# Patient Record
Sex: Female | Born: 1935 | Race: White | Hispanic: No | Marital: Married | Smoking: Never smoker
Health system: Southern US, Community
[De-identification: ages and names within clinical notes are randomized; demographics above are authoritative.]

## PROBLEM LIST (undated history)

## (undated) DIAGNOSIS — I2510 Atherosclerotic heart disease of native coronary artery without angina pectoris: Secondary | ICD-10-CM

## (undated) DIAGNOSIS — N182 Chronic kidney disease, stage 2 (mild): Secondary | ICD-10-CM

## (undated) DIAGNOSIS — D62 Acute posthemorrhagic anemia: Secondary | ICD-10-CM

## (undated) DIAGNOSIS — K56609 Unspecified intestinal obstruction, unspecified as to partial versus complete obstruction: Secondary | ICD-10-CM

## (undated) DIAGNOSIS — L900 Lichen sclerosus et atrophicus: Secondary | ICD-10-CM

## (undated) DIAGNOSIS — I471 Supraventricular tachycardia, unspecified: Secondary | ICD-10-CM

## (undated) DIAGNOSIS — K5730 Diverticulosis of large intestine without perforation or abscess without bleeding: Secondary | ICD-10-CM

## (undated) DIAGNOSIS — I4891 Unspecified atrial fibrillation: Secondary | ICD-10-CM

## (undated) DIAGNOSIS — I10 Essential (primary) hypertension: Secondary | ICD-10-CM

## (undated) DIAGNOSIS — M7070 Other bursitis of hip, unspecified hip: Secondary | ICD-10-CM

## (undated) DIAGNOSIS — L03115 Cellulitis of right lower limb: Secondary | ICD-10-CM

## (undated) DIAGNOSIS — K635 Polyp of colon: Secondary | ICD-10-CM

## (undated) DIAGNOSIS — M1990 Unspecified osteoarthritis, unspecified site: Secondary | ICD-10-CM

## (undated) DIAGNOSIS — M419 Scoliosis, unspecified: Secondary | ICD-10-CM

## (undated) DIAGNOSIS — Z860100 Personal history of colon polyps, unspecified: Secondary | ICD-10-CM

## (undated) DIAGNOSIS — M8580 Other specified disorders of bone density and structure, unspecified site: Secondary | ICD-10-CM

## (undated) DIAGNOSIS — R001 Bradycardia, unspecified: Secondary | ICD-10-CM

## (undated) DIAGNOSIS — C801 Malignant (primary) neoplasm, unspecified: Secondary | ICD-10-CM

## (undated) DIAGNOSIS — F419 Anxiety disorder, unspecified: Secondary | ICD-10-CM

## (undated) DIAGNOSIS — K589 Irritable bowel syndrome without diarrhea: Secondary | ICD-10-CM

## (undated) DIAGNOSIS — I491 Atrial premature depolarization: Secondary | ICD-10-CM

## (undated) DIAGNOSIS — K219 Gastro-esophageal reflux disease without esophagitis: Secondary | ICD-10-CM

## (undated) DIAGNOSIS — I4510 Unspecified right bundle-branch block: Secondary | ICD-10-CM

## (undated) DIAGNOSIS — N289 Disorder of kidney and ureter, unspecified: Secondary | ICD-10-CM

## (undated) HISTORY — DX: Unspecified intestinal obstruction, unspecified as to partial versus complete obstruction: K56.609

## (undated) HISTORY — DX: Supraventricular tachycardia: I47.1

## (undated) HISTORY — DX: Polyp of colon: K63.5

## (undated) HISTORY — DX: Supraventricular tachycardia, unspecified: I47.10

## (undated) HISTORY — DX: Personal history of colon polyps, unspecified: Z86.0100

## (undated) HISTORY — DX: Other bursitis of hip, unspecified hip: M70.70

## (undated) HISTORY — DX: Essential (primary) hypertension: I10

## (undated) HISTORY — DX: Anxiety disorder, unspecified: F41.9

## (undated) HISTORY — DX: Irritable bowel syndrome, unspecified: K58.9

## (undated) HISTORY — DX: Unspecified atrial fibrillation: I48.91

## (undated) HISTORY — PX: REDUCTION MAMMAPLASTY: SUR839

## (undated) HISTORY — DX: Atrial premature depolarization: I49.1

## (undated) HISTORY — DX: Other specified disorders of bone density and structure, unspecified site: M85.80

## (undated) HISTORY — PX: HAMMER TOE SURGERY: SHX385

## (undated) HISTORY — DX: Lichen sclerosus et atrophicus: L90.0

## (undated) HISTORY — DX: Diverticulosis of large intestine without perforation or abscess without bleeding: K57.30

## (undated) HISTORY — DX: Gastro-esophageal reflux disease without esophagitis: K21.9

## (undated) HISTORY — PX: HEMORRHOID BANDING: SHX5850

## (undated) HISTORY — DX: Personal history of colonic polyps: Z86.010

---
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
## 1998-11-08 ENCOUNTER — Other Ambulatory Visit: Admission: RE | Admit: 1998-11-08 | Discharge: 1998-11-08 | Payer: Self-pay | Admitting: Obstetrics and Gynecology

## 1999-12-07 ENCOUNTER — Other Ambulatory Visit: Admission: RE | Admit: 1999-12-07 | Discharge: 1999-12-07 | Payer: Self-pay | Admitting: Obstetrics and Gynecology

## 2000-04-19 ENCOUNTER — Ambulatory Visit (HOSPITAL_COMMUNITY): Admission: RE | Admit: 2000-04-19 | Discharge: 2000-04-19 | Payer: Self-pay | Admitting: Gastroenterology

## 2000-04-19 ENCOUNTER — Encounter: Payer: Self-pay | Admitting: Gastroenterology

## 2000-12-18 ENCOUNTER — Other Ambulatory Visit: Admission: RE | Admit: 2000-12-18 | Discharge: 2000-12-18 | Payer: Self-pay | Admitting: Obstetrics and Gynecology

## 2000-12-31 ENCOUNTER — Encounter: Admission: RE | Admit: 2000-12-31 | Discharge: 2000-12-31 | Payer: Self-pay | Admitting: Obstetrics and Gynecology

## 2000-12-31 ENCOUNTER — Encounter: Payer: Self-pay | Admitting: Obstetrics and Gynecology

## 2001-10-28 LAB — HM COLONOSCOPY: HM Colonoscopy: ABNORMAL

## 2001-12-18 ENCOUNTER — Other Ambulatory Visit: Admission: RE | Admit: 2001-12-18 | Discharge: 2001-12-18 | Payer: Self-pay | Admitting: Obstetrics and Gynecology

## 2002-01-05 ENCOUNTER — Encounter: Payer: Self-pay | Admitting: Obstetrics and Gynecology

## 2002-01-05 ENCOUNTER — Encounter: Admission: RE | Admit: 2002-01-05 | Discharge: 2002-01-05 | Payer: Self-pay | Admitting: Obstetrics and Gynecology

## 2002-01-08 ENCOUNTER — Encounter: Admission: RE | Admit: 2002-01-08 | Discharge: 2002-01-08 | Payer: Self-pay | Admitting: Obstetrics and Gynecology

## 2002-01-08 ENCOUNTER — Encounter: Payer: Self-pay | Admitting: Obstetrics and Gynecology

## 2002-01-08 HISTORY — PX: VAGINAL HYSTERECTOMY: SUR661

## 2002-01-08 HISTORY — PX: ANTERIOR AND POSTERIOR VAGINAL REPAIR: SUR5

## 2002-01-08 HISTORY — PX: PUBOVAGINAL SLING: SHX1035

## 2002-01-28 ENCOUNTER — Encounter: Payer: Self-pay | Admitting: Urology

## 2002-02-05 ENCOUNTER — Encounter: Payer: Self-pay | Admitting: Urology

## 2002-02-05 ENCOUNTER — Inpatient Hospital Stay (HOSPITAL_COMMUNITY): Admission: RE | Admit: 2002-02-05 | Discharge: 2002-02-07 | Payer: Self-pay | Admitting: Urology

## 2002-02-05 ENCOUNTER — Encounter (INDEPENDENT_AMBULATORY_CARE_PROVIDER_SITE_OTHER): Payer: Self-pay | Admitting: Specialist

## 2003-01-26 ENCOUNTER — Encounter: Admission: RE | Admit: 2003-01-26 | Discharge: 2003-01-26 | Payer: Self-pay | Admitting: Obstetrics and Gynecology

## 2003-01-26 ENCOUNTER — Encounter: Payer: Self-pay | Admitting: Obstetrics and Gynecology

## 2003-12-28 ENCOUNTER — Encounter: Admission: RE | Admit: 2003-12-28 | Discharge: 2003-12-28 | Payer: Self-pay | Admitting: Gastroenterology

## 2004-02-01 ENCOUNTER — Encounter: Admission: RE | Admit: 2004-02-01 | Discharge: 2004-02-01 | Payer: Self-pay | Admitting: Obstetrics and Gynecology

## 2004-07-28 ENCOUNTER — Ambulatory Visit: Payer: Self-pay | Admitting: Internal Medicine

## 2004-10-30 ENCOUNTER — Other Ambulatory Visit: Admission: RE | Admit: 2004-10-30 | Discharge: 2004-10-30 | Payer: Self-pay | Admitting: Obstetrics and Gynecology

## 2004-12-04 ENCOUNTER — Ambulatory Visit: Payer: Self-pay | Admitting: Internal Medicine

## 2004-12-19 ENCOUNTER — Ambulatory Visit: Payer: Self-pay | Admitting: Internal Medicine

## 2004-12-28 ENCOUNTER — Ambulatory Visit: Payer: Self-pay | Admitting: Cardiology

## 2004-12-28 ENCOUNTER — Ambulatory Visit: Payer: Self-pay | Admitting: *Deleted

## 2004-12-28 ENCOUNTER — Ambulatory Visit: Payer: Self-pay

## 2005-01-02 ENCOUNTER — Ambulatory Visit: Payer: Self-pay | Admitting: *Deleted

## 2005-02-06 ENCOUNTER — Encounter: Admission: RE | Admit: 2005-02-06 | Discharge: 2005-02-06 | Payer: Self-pay | Admitting: Obstetrics and Gynecology

## 2005-02-13 ENCOUNTER — Ambulatory Visit: Payer: Self-pay | Admitting: *Deleted

## 2005-04-17 ENCOUNTER — Ambulatory Visit: Payer: Self-pay | Admitting: Cardiology

## 2005-04-25 ENCOUNTER — Ambulatory Visit: Payer: Self-pay

## 2005-05-22 ENCOUNTER — Ambulatory Visit: Payer: Self-pay | Admitting: *Deleted

## 2005-10-31 ENCOUNTER — Other Ambulatory Visit: Admission: RE | Admit: 2005-10-31 | Discharge: 2005-10-31 | Payer: Self-pay | Admitting: Obstetrics and Gynecology

## 2005-12-05 ENCOUNTER — Ambulatory Visit: Payer: Self-pay | Admitting: *Deleted

## 2005-12-25 ENCOUNTER — Ambulatory Visit: Payer: Self-pay

## 2006-01-28 ENCOUNTER — Ambulatory Visit: Payer: Self-pay | Admitting: Internal Medicine

## 2006-03-25 ENCOUNTER — Encounter: Admission: RE | Admit: 2006-03-25 | Discharge: 2006-03-25 | Payer: Self-pay | Admitting: Obstetrics and Gynecology

## 2006-05-15 ENCOUNTER — Ambulatory Visit: Payer: Self-pay | Admitting: *Deleted

## 2006-06-05 ENCOUNTER — Ambulatory Visit: Payer: Self-pay | Admitting: Gastroenterology

## 2006-07-09 ENCOUNTER — Encounter: Admission: RE | Admit: 2006-07-09 | Discharge: 2006-07-09 | Payer: Self-pay | Admitting: Neurosurgery

## 2006-07-17 ENCOUNTER — Ambulatory Visit (HOSPITAL_COMMUNITY): Admission: RE | Admit: 2006-07-17 | Discharge: 2006-07-17 | Payer: Self-pay | Admitting: Neurosurgery

## 2006-07-21 ENCOUNTER — Encounter: Admission: RE | Admit: 2006-07-21 | Discharge: 2006-07-21 | Payer: Self-pay | Admitting: Neurosurgery

## 2006-07-26 ENCOUNTER — Ambulatory Visit: Payer: Self-pay | Admitting: Internal Medicine

## 2006-07-26 LAB — CONVERTED CEMR LAB
ALT: 18 units/L (ref 0–40)
AST: 26 units/L (ref 0–37)
Albumin: 3.2 g/dL — ABNORMAL LOW (ref 3.5–5.2)
Alkaline Phosphatase: 34 units/L — ABNORMAL LOW (ref 39–117)
BUN: 23 mg/dL (ref 6–23)
Basophils Absolute: 0 10*3/uL (ref 0.0–0.1)
Basophils Relative: 0.4 % (ref 0.0–1.0)
Bilirubin Urine: NEGATIVE
CO2: 26 meq/L (ref 19–32)
Calcium: 9.2 mg/dL (ref 8.4–10.5)
Chloride: 106 meq/L (ref 96–112)
Chol/HDL Ratio, serum: 2.8
Cholesterol: 197 mg/dL (ref 0–200)
Creatinine, Ser: 1.5 mg/dL — ABNORMAL HIGH (ref 0.4–1.2)
Eosinophil percent: 4.7 % (ref 0.0–5.0)
GFR calc non Af Amer: 37 mL/min
Glomerular Filtration Rate, Af Am: 44 mL/min/{1.73_m2}
Glucose, Bld: 88 mg/dL (ref 70–99)
HCT: 37 % (ref 36.0–46.0)
HDL: 70.2 mg/dL (ref >39.0)
Hemoglobin: 12.7 g/dL (ref 12.0–15.0)
Ketones, ur: NEGATIVE mg/dL
LDL Cholesterol: 90 mg/dL (ref 0–99)
Leukocytes, UA: NEGATIVE
Lymphocytes Relative: 30.2 % (ref 12.0–46.0)
MCHC: 34.3 g/dL (ref 30.0–36.0)
MCV: 92.1 fL (ref 78.0–100.0)
Monocytes Absolute: 0.7 10*3/uL (ref 0.2–0.7)
Monocytes Relative: 9.6 % (ref 3.0–11.0)
Neutro Abs: 3.8 10*3/uL (ref 1.4–7.7)
Neutrophils Relative %: 55.1 % (ref 43.0–77.0)
Nitrite: NEGATIVE
Platelets: 235 10*3/uL (ref 150–400)
Potassium: 5.3 meq/L — ABNORMAL HIGH (ref 3.5–5.1)
RBC: 4.02 M/uL (ref 3.87–5.11)
RDW: 13.1 % (ref 11.5–14.6)
Sodium: 139 meq/L (ref 135–145)
Specific Gravity, Urine: 1.02 (ref 1.000–1.03)
TSH: 5.92 microintl units/mL — ABNORMAL HIGH (ref 0.35–5.50)
Total Bilirubin: 0.6 mg/dL (ref 0.3–1.2)
Total Protein, Urine: NEGATIVE mg/dL
Total Protein: 6.3 g/dL (ref 6.0–8.3)
Triglyceride fasting, serum: 183 mg/dL — ABNORMAL HIGH (ref 0–149)
Urine Glucose: NEGATIVE mg/dL
Urobilinogen, UA: 0.2 (ref 0.0–1.0)
VLDL: 37 mg/dL (ref 0–40)
WBC: 6.9 10*3/uL (ref 4.5–10.5)
pH: 6 (ref 5.0–8.0)

## 2006-07-28 ENCOUNTER — Encounter: Admission: RE | Admit: 2006-07-28 | Discharge: 2006-07-28 | Payer: Self-pay | Admitting: Neurosurgery

## 2006-07-30 ENCOUNTER — Ambulatory Visit: Payer: Self-pay | Admitting: Internal Medicine

## 2006-08-12 ENCOUNTER — Encounter: Payer: Self-pay | Admitting: Neurosurgery

## 2006-08-21 ENCOUNTER — Ambulatory Visit (HOSPITAL_COMMUNITY): Admission: RE | Admit: 2006-08-21 | Discharge: 2006-08-21 | Payer: Self-pay | Admitting: Neurosurgery

## 2006-11-14 ENCOUNTER — Ambulatory Visit: Payer: Self-pay | Admitting: *Deleted

## 2006-11-27 ENCOUNTER — Other Ambulatory Visit: Admission: RE | Admit: 2006-11-27 | Discharge: 2006-11-27 | Payer: Self-pay | Admitting: Obstetrics and Gynecology

## 2007-03-18 ENCOUNTER — Ambulatory Visit: Payer: Self-pay | Admitting: Internal Medicine

## 2007-05-19 ENCOUNTER — Encounter: Admission: RE | Admit: 2007-05-19 | Discharge: 2007-05-19 | Payer: Self-pay | Admitting: Obstetrics and Gynecology

## 2007-06-16 ENCOUNTER — Encounter: Payer: Self-pay | Admitting: Internal Medicine

## 2007-06-16 DIAGNOSIS — Z860100 Personal history of colon polyps, unspecified: Secondary | ICD-10-CM | POA: Insufficient documentation

## 2007-06-16 DIAGNOSIS — K219 Gastro-esophageal reflux disease without esophagitis: Secondary | ICD-10-CM | POA: Insufficient documentation

## 2007-06-16 DIAGNOSIS — I10 Essential (primary) hypertension: Secondary | ICD-10-CM | POA: Insufficient documentation

## 2007-06-17 ENCOUNTER — Ambulatory Visit: Payer: Self-pay | Admitting: Internal Medicine

## 2007-08-11 ENCOUNTER — Encounter: Payer: Self-pay | Admitting: Internal Medicine

## 2007-08-20 ENCOUNTER — Encounter: Payer: Self-pay | Admitting: Internal Medicine

## 2007-10-15 ENCOUNTER — Encounter: Payer: Self-pay | Admitting: Internal Medicine

## 2007-12-04 ENCOUNTER — Other Ambulatory Visit: Admission: RE | Admit: 2007-12-04 | Discharge: 2007-12-04 | Payer: Self-pay | Admitting: Obstetrics and Gynecology

## 2007-12-08 ENCOUNTER — Telehealth: Payer: Self-pay | Admitting: Internal Medicine

## 2007-12-08 ENCOUNTER — Ambulatory Visit: Payer: Self-pay | Admitting: Internal Medicine

## 2007-12-08 DIAGNOSIS — R1032 Left lower quadrant pain: Secondary | ICD-10-CM | POA: Insufficient documentation

## 2007-12-08 LAB — CONVERTED CEMR LAB
Basophils Absolute: 0.1 10*3/uL (ref 0.0–0.1)
Basophils Relative: 0.7 % (ref 0.0–1.0)
Bilirubin Urine: NEGATIVE
Eosinophils Absolute: 0.3 10*3/uL (ref 0.0–0.7)
Eosinophils Relative: 3.2 % (ref 0.0–5.0)
HCT: 38 % (ref 36.0–46.0)
Hemoglobin, Urine: NEGATIVE
Hemoglobin: 12.6 g/dL (ref 12.0–15.0)
Ketones, ur: NEGATIVE mg/dL
Leukocytes, UA: NEGATIVE
Lymphocytes Relative: 17.7 % (ref 12.0–46.0)
MCHC: 33 g/dL (ref 30.0–36.0)
MCV: 92.7 fL (ref 78.0–100.0)
Monocytes Absolute: 0.7 10*3/uL (ref 0.1–1.0)
Monocytes Relative: 7.9 % (ref 3.0–12.0)
Neutro Abs: 6.6 10*3/uL (ref 1.4–7.7)
Neutrophils Relative %: 70.5 % (ref 43.0–77.0)
Nitrite: NEGATIVE
Platelets: 240 10*3/uL (ref 150–400)
RBC: 4.1 M/uL (ref 3.87–5.11)
RDW: 13.2 % (ref 11.5–14.6)
Sed Rate: 40 mm/hr — ABNORMAL HIGH (ref 0–22)
Specific Gravity, Urine: 1.025 (ref 1.000–1.03)
Total Protein, Urine: NEGATIVE mg/dL
Urine Glucose: NEGATIVE mg/dL
Urobilinogen, UA: 0.2 (ref 0.0–1.0)
WBC: 9.3 10*3/uL (ref 4.5–10.5)
pH: 5.5 (ref 5.0–8.0)

## 2008-01-20 ENCOUNTER — Encounter: Payer: Self-pay | Admitting: Internal Medicine

## 2008-05-20 ENCOUNTER — Telehealth: Payer: Self-pay | Admitting: Internal Medicine

## 2008-05-21 ENCOUNTER — Ambulatory Visit: Payer: Self-pay | Admitting: Internal Medicine

## 2008-05-21 DIAGNOSIS — F411 Generalized anxiety disorder: Secondary | ICD-10-CM | POA: Insufficient documentation

## 2008-05-21 DIAGNOSIS — F419 Anxiety disorder, unspecified: Secondary | ICD-10-CM | POA: Insufficient documentation

## 2008-05-21 DIAGNOSIS — K5730 Diverticulosis of large intestine without perforation or abscess without bleeding: Secondary | ICD-10-CM | POA: Insufficient documentation

## 2008-05-21 DIAGNOSIS — J309 Allergic rhinitis, unspecified: Secondary | ICD-10-CM | POA: Insufficient documentation

## 2008-05-21 DIAGNOSIS — K589 Irritable bowel syndrome without diarrhea: Secondary | ICD-10-CM | POA: Insufficient documentation

## 2008-05-24 ENCOUNTER — Ambulatory Visit: Payer: Self-pay | Admitting: Internal Medicine

## 2008-05-24 LAB — CONVERTED CEMR LAB
ALT: 24 units/L (ref 0–35)
AST: 29 units/L (ref 0–37)
Albumin: 3.4 g/dL — ABNORMAL LOW (ref 3.5–5.2)
Alkaline Phosphatase: 44 units/L (ref 39–117)
BUN: 23 mg/dL (ref 6–23)
Basophils Absolute: 0.1 10*3/uL (ref 0.0–0.1)
Basophils Relative: 1.2 % (ref 0.0–3.0)
Bilirubin Urine: NEGATIVE
Bilirubin, Direct: 0.1 mg/dL (ref 0.0–0.3)
CO2: 24 meq/L (ref 19–32)
Calcium: 8.7 mg/dL (ref 8.4–10.5)
Chloride: 112 meq/L (ref 96–112)
Cholesterol: 196 mg/dL (ref 0–200)
Creatinine, Ser: 1.3 mg/dL — ABNORMAL HIGH (ref 0.4–1.2)
Eosinophils Absolute: 0.3 10*3/uL (ref 0.0–0.7)
Eosinophils Relative: 4.9 % (ref 0.0–5.0)
GFR calc Af Amer: 52 mL/min
GFR calc non Af Amer: 43 mL/min
Glucose, Bld: 92 mg/dL (ref 70–99)
HCT: 35 % — ABNORMAL LOW (ref 36.0–46.0)
HDL: 71.9 mg/dL (ref >39.0)
Hemoglobin: 12.3 g/dL (ref 12.0–15.0)
Ketones, ur: NEGATIVE mg/dL
LDL Cholesterol: 99 mg/dL (ref 0–99)
Leukocytes, UA: NEGATIVE
Lymphocytes Relative: 31 % (ref 12.0–46.0)
MCHC: 35.3 g/dL (ref 30.0–36.0)
MCV: 91.6 fL (ref 78.0–100.0)
Monocytes Absolute: 0.5 10*3/uL (ref 0.1–1.0)
Monocytes Relative: 9.6 % (ref 3.0–12.0)
Neutro Abs: 2.9 10*3/uL (ref 1.4–7.7)
Neutrophils Relative %: 53.3 % (ref 43.0–77.0)
Nitrite: NEGATIVE
Platelets: 227 10*3/uL (ref 150–400)
Potassium: 4.2 meq/L (ref 3.5–5.1)
RBC: 3.82 M/uL — ABNORMAL LOW (ref 3.87–5.11)
RDW: 12.4 % (ref 11.5–14.6)
Sodium: 141 meq/L (ref 135–145)
Specific Gravity, Urine: 1.01 (ref 1.000–1.03)
TSH: 3.38 microintl units/mL (ref 0.35–5.50)
Total Bilirubin: 0.7 mg/dL (ref 0.3–1.2)
Total CHOL/HDL Ratio: 2.7
Total Protein, Urine: NEGATIVE mg/dL
Total Protein: 6.3 g/dL (ref 6.0–8.3)
Triglycerides: 128 mg/dL (ref 0–149)
Urine Glucose: NEGATIVE mg/dL
Urobilinogen, UA: 0.2 (ref 0.0–1.0)
VLDL: 26 mg/dL (ref 0–40)
WBC: 5.5 10*3/uL (ref 4.5–10.5)
pH: 6.5 (ref 5.0–8.0)

## 2008-05-26 ENCOUNTER — Ambulatory Visit: Payer: Self-pay | Admitting: Internal Medicine

## 2008-05-26 DIAGNOSIS — I959 Hypotension, unspecified: Secondary | ICD-10-CM | POA: Insufficient documentation

## 2008-06-03 ENCOUNTER — Encounter: Admission: RE | Admit: 2008-06-03 | Discharge: 2008-06-03 | Payer: Self-pay | Admitting: Obstetrics and Gynecology

## 2008-06-28 ENCOUNTER — Encounter: Payer: Self-pay | Admitting: Internal Medicine

## 2008-07-28 ENCOUNTER — Encounter: Payer: Self-pay | Admitting: Internal Medicine

## 2008-08-11 ENCOUNTER — Telehealth: Payer: Self-pay | Admitting: Internal Medicine

## 2008-08-16 ENCOUNTER — Encounter: Payer: Self-pay | Admitting: Internal Medicine

## 2008-08-18 ENCOUNTER — Encounter: Admission: RE | Admit: 2008-08-18 | Discharge: 2008-08-18 | Payer: Self-pay | Admitting: Gastroenterology

## 2008-08-24 ENCOUNTER — Ambulatory Visit: Payer: Self-pay | Admitting: Internal Medicine

## 2008-08-24 LAB — CONVERTED CEMR LAB
BUN: 25 mg/dL — ABNORMAL HIGH (ref 6–23)
CO2: 26 meq/L (ref 19–32)
Calcium: 8.8 mg/dL (ref 8.4–10.5)
Chloride: 107 meq/L (ref 96–112)
Creatinine, Ser: 1.3 mg/dL — ABNORMAL HIGH (ref 0.4–1.2)
GFR calc Af Amer: 52 mL/min
GFR calc non Af Amer: 43 mL/min
Glucose, Bld: 60 mg/dL — ABNORMAL LOW (ref 70–99)
Potassium: 4 meq/L (ref 3.5–5.1)
Sodium: 138 meq/L (ref 135–145)

## 2008-08-25 ENCOUNTER — Ambulatory Visit: Payer: Self-pay | Admitting: Internal Medicine

## 2008-08-25 DIAGNOSIS — N259 Disorder resulting from impaired renal tubular function, unspecified: Secondary | ICD-10-CM | POA: Insufficient documentation

## 2008-08-25 DIAGNOSIS — M25559 Pain in unspecified hip: Secondary | ICD-10-CM | POA: Insufficient documentation

## 2008-09-10 DIAGNOSIS — M7070 Other bursitis of hip, unspecified hip: Secondary | ICD-10-CM

## 2008-09-10 HISTORY — DX: Other bursitis of hip, unspecified hip: M70.70

## 2008-09-16 ENCOUNTER — Encounter: Admission: RE | Admit: 2008-09-16 | Discharge: 2008-09-16 | Payer: Self-pay | Admitting: Sports Medicine

## 2008-09-22 ENCOUNTER — Encounter: Admission: RE | Admit: 2008-09-22 | Discharge: 2008-09-22 | Payer: Self-pay | Admitting: Sports Medicine

## 2008-10-25 ENCOUNTER — Ambulatory Visit: Payer: Self-pay | Admitting: Internal Medicine

## 2008-10-25 LAB — CONVERTED CEMR LAB
BUN: 20 mg/dL (ref 6–23)
CO2: 26 meq/L (ref 19–32)
Calcium: 9.4 mg/dL (ref 8.4–10.5)
Chloride: 108 meq/L (ref 96–112)
Creatinine, Ser: 1.2 mg/dL (ref 0.4–1.2)
GFR calc Af Amer: 57 mL/min
GFR calc non Af Amer: 47 mL/min
Glucose, Bld: 82 mg/dL (ref 70–99)
Potassium: 4.3 meq/L (ref 3.5–5.1)
Sodium: 140 meq/L (ref 135–145)

## 2008-10-27 ENCOUNTER — Ambulatory Visit: Payer: Self-pay | Admitting: Internal Medicine

## 2008-10-27 DIAGNOSIS — R55 Syncope and collapse: Secondary | ICD-10-CM | POA: Insufficient documentation

## 2008-10-28 ENCOUNTER — Ambulatory Visit: Payer: Self-pay | Admitting: Internal Medicine

## 2008-10-28 ENCOUNTER — Encounter: Payer: Self-pay | Admitting: Internal Medicine

## 2008-10-28 LAB — CONVERTED CEMR LAB
BUN: 23 mg/dL (ref 6–23)
CO2: 27 meq/L (ref 19–32)
Calcium: 9 mg/dL (ref 8.4–10.5)
Chloride: 106 meq/L (ref 96–112)
Cortisol, Plasma: 6.3 ug/dL
Creatinine, Ser: 1.3 mg/dL — ABNORMAL HIGH (ref 0.4–1.2)
GFR calc Af Amer: 52 mL/min
GFR calc non Af Amer: 43 mL/min
Glucose, Bld: 85 mg/dL (ref 70–99)
Potassium: 4 meq/L (ref 3.5–5.1)
Sodium: 139 meq/L (ref 135–145)

## 2008-11-01 LAB — CONVERTED CEMR LAB: Cortisol, Plasma: 15 ug/dL

## 2008-11-03 ENCOUNTER — Telehealth (INDEPENDENT_AMBULATORY_CARE_PROVIDER_SITE_OTHER): Payer: Self-pay | Admitting: *Deleted

## 2008-11-04 ENCOUNTER — Encounter: Payer: Self-pay | Admitting: Internal Medicine

## 2008-11-05 ENCOUNTER — Telehealth: Payer: Self-pay | Admitting: Internal Medicine

## 2008-11-15 ENCOUNTER — Encounter: Payer: Self-pay | Admitting: Internal Medicine

## 2008-11-16 ENCOUNTER — Encounter: Admission: RE | Admit: 2008-11-16 | Discharge: 2008-11-16 | Payer: Self-pay | Admitting: Neurosurgery

## 2008-11-25 ENCOUNTER — Encounter: Payer: Self-pay | Admitting: Internal Medicine

## 2008-12-01 ENCOUNTER — Ambulatory Visit: Payer: Self-pay | Admitting: Internal Medicine

## 2008-12-06 ENCOUNTER — Other Ambulatory Visit: Admission: RE | Admit: 2008-12-06 | Discharge: 2008-12-06 | Payer: Self-pay | Admitting: Obstetrics and Gynecology

## 2008-12-06 ENCOUNTER — Encounter: Payer: Self-pay | Admitting: Obstetrics and Gynecology

## 2008-12-06 ENCOUNTER — Ambulatory Visit: Payer: Self-pay | Admitting: Obstetrics and Gynecology

## 2009-01-13 ENCOUNTER — Encounter: Payer: Self-pay | Admitting: Internal Medicine

## 2009-02-04 ENCOUNTER — Encounter: Payer: Self-pay | Admitting: Internal Medicine

## 2009-03-29 ENCOUNTER — Telehealth: Payer: Self-pay | Admitting: Internal Medicine

## 2009-03-30 ENCOUNTER — Encounter: Payer: Self-pay | Admitting: Internal Medicine

## 2009-03-31 ENCOUNTER — Encounter: Payer: Self-pay | Admitting: Internal Medicine

## 2009-04-10 HISTORY — PX: KNEE BURSECTOMY: SHX5882

## 2009-04-10 HISTORY — PX: HIP SURGERY: SHX245

## 2009-04-15 ENCOUNTER — Encounter: Admission: RE | Admit: 2009-04-15 | Discharge: 2009-04-15 | Payer: Self-pay | Admitting: Orthopedic Surgery

## 2009-04-18 ENCOUNTER — Ambulatory Visit (HOSPITAL_BASED_OUTPATIENT_CLINIC_OR_DEPARTMENT_OTHER): Admission: RE | Admit: 2009-04-18 | Discharge: 2009-04-18 | Payer: Self-pay | Admitting: Orthopedic Surgery

## 2009-06-03 ENCOUNTER — Encounter: Payer: Self-pay | Admitting: Internal Medicine

## 2009-06-08 ENCOUNTER — Encounter: Admission: RE | Admit: 2009-06-08 | Discharge: 2009-06-08 | Payer: Self-pay | Admitting: Obstetrics and Gynecology

## 2009-06-14 ENCOUNTER — Ambulatory Visit: Payer: Self-pay | Admitting: Internal Medicine

## 2009-06-16 LAB — CONVERTED CEMR LAB
BUN: 23 mg/dL (ref 6–23)
Basophils Absolute: 0 10*3/uL (ref 0.0–0.1)
Basophils Relative: 0.4 % (ref 0.0–3.0)
CO2: 26 meq/L (ref 19–32)
Calcium: 8.9 mg/dL (ref 8.4–10.5)
Chloride: 109 meq/L (ref 96–112)
Creatinine, Ser: 1.3 mg/dL — ABNORMAL HIGH (ref 0.4–1.2)
Eosinophils Absolute: 0.3 10*3/uL (ref 0.0–0.7)
Eosinophils Relative: 3.7 % (ref 0.0–5.0)
GFR calc non Af Amer: 42.73 mL/min (ref >60)
Glucose, Bld: 95 mg/dL (ref 70–99)
HCT: 36.9 % (ref 36.0–46.0)
Hemoglobin: 12.5 g/dL (ref 12.0–15.0)
Lymphocytes Relative: 26.8 % (ref 12.0–46.0)
Lymphs Abs: 2 10*3/uL (ref 0.7–4.0)
MCHC: 34 g/dL (ref 30.0–36.0)
MCV: 93.2 fL (ref 78.0–100.0)
Monocytes Absolute: 0.7 10*3/uL (ref 0.1–1.0)
Monocytes Relative: 9.6 % (ref 3.0–12.0)
Neutro Abs: 4.5 10*3/uL (ref 1.4–7.7)
Neutrophils Relative %: 59.5 % (ref 43.0–77.0)
Platelets: 231 10*3/uL (ref 150.0–400.0)
Potassium: 4.1 meq/L (ref 3.5–5.1)
RBC: 3.95 M/uL (ref 3.87–5.11)
RDW: 12.4 % (ref 11.5–14.6)
Sed Rate: 39 mm/hr — ABNORMAL HIGH (ref 0–22)
Sodium: 141 meq/L (ref 135–145)
Uric Acid, Serum: 6.1 mg/dL (ref 2.4–7.0)
WBC: 7.5 10*3/uL (ref 4.5–10.5)

## 2009-07-25 ENCOUNTER — Encounter: Payer: Self-pay | Admitting: Internal Medicine

## 2009-08-12 ENCOUNTER — Telehealth: Payer: Self-pay | Admitting: Internal Medicine

## 2009-08-13 ENCOUNTER — Encounter: Payer: Self-pay | Admitting: Internal Medicine

## 2009-08-13 ENCOUNTER — Encounter: Admission: RE | Admit: 2009-08-13 | Discharge: 2009-08-13 | Payer: Self-pay | Admitting: Orthopedic Surgery

## 2009-08-19 ENCOUNTER — Encounter: Payer: Self-pay | Admitting: Internal Medicine

## 2009-08-23 ENCOUNTER — Encounter: Payer: Self-pay | Admitting: Internal Medicine

## 2009-08-23 ENCOUNTER — Encounter: Admission: RE | Admit: 2009-08-23 | Discharge: 2009-08-23 | Payer: Self-pay | Admitting: Neurosurgery

## 2009-08-24 ENCOUNTER — Ambulatory Visit: Payer: Self-pay | Admitting: Internal Medicine

## 2009-09-09 ENCOUNTER — Encounter: Payer: Self-pay | Admitting: Internal Medicine

## 2009-09-27 ENCOUNTER — Encounter: Payer: Self-pay | Admitting: Internal Medicine

## 2009-10-19 ENCOUNTER — Telehealth: Payer: Self-pay | Admitting: Internal Medicine

## 2009-10-31 ENCOUNTER — Ambulatory Visit: Payer: Self-pay | Admitting: Internal Medicine

## 2009-10-31 DIAGNOSIS — R51 Headache: Secondary | ICD-10-CM

## 2009-10-31 DIAGNOSIS — R519 Headache, unspecified: Secondary | ICD-10-CM | POA: Insufficient documentation

## 2009-11-07 ENCOUNTER — Telehealth: Payer: Self-pay | Admitting: Internal Medicine

## 2009-11-11 ENCOUNTER — Encounter: Payer: Self-pay | Admitting: Internal Medicine

## 2009-11-11 ENCOUNTER — Telehealth: Payer: Self-pay | Admitting: Internal Medicine

## 2009-11-14 ENCOUNTER — Telehealth: Payer: Self-pay | Admitting: Internal Medicine

## 2009-11-25 ENCOUNTER — Telehealth: Payer: Self-pay | Admitting: Internal Medicine

## 2009-12-01 ENCOUNTER — Ambulatory Visit: Payer: Self-pay | Admitting: Internal Medicine

## 2009-12-01 DIAGNOSIS — Z87891 Personal history of nicotine dependence: Secondary | ICD-10-CM | POA: Insufficient documentation

## 2009-12-02 ENCOUNTER — Telehealth: Payer: Self-pay | Admitting: Internal Medicine

## 2009-12-07 ENCOUNTER — Other Ambulatory Visit: Admission: RE | Admit: 2009-12-07 | Discharge: 2009-12-07 | Payer: Self-pay | Admitting: Obstetrics and Gynecology

## 2009-12-07 ENCOUNTER — Ambulatory Visit: Payer: Self-pay | Admitting: Obstetrics and Gynecology

## 2010-02-15 ENCOUNTER — Ambulatory Visit: Payer: Self-pay | Admitting: Internal Medicine

## 2010-02-15 DIAGNOSIS — R209 Unspecified disturbances of skin sensation: Secondary | ICD-10-CM | POA: Insufficient documentation

## 2010-02-15 DIAGNOSIS — R109 Unspecified abdominal pain: Secondary | ICD-10-CM | POA: Insufficient documentation

## 2010-02-17 LAB — CONVERTED CEMR LAB
ALT: 17 units/L (ref 0–35)
AST: 29 units/L (ref 0–37)
Albumin: 3.6 g/dL (ref 3.5–5.2)
Alkaline Phosphatase: 50 units/L (ref 39–117)
BUN: 21 mg/dL (ref 6–23)
Basophils Absolute: 0 10*3/uL (ref 0.0–0.1)
Basophils Relative: 0.6 % (ref 0.0–3.0)
Bilirubin, Direct: 0.1 mg/dL (ref 0.0–0.3)
CO2: 26 meq/L (ref 19–32)
Calcium: 9.1 mg/dL (ref 8.4–10.5)
Chloride: 103 meq/L (ref 96–112)
Creatinine, Ser: 1 mg/dL (ref 0.4–1.2)
Eosinophils Absolute: 0.4 10*3/uL (ref 0.0–0.7)
Eosinophils Relative: 5.7 % — ABNORMAL HIGH (ref 0.0–5.0)
GFR calc non Af Amer: 56.42 mL/min (ref >60)
Glucose, Bld: 75 mg/dL (ref 70–99)
HCT: 37.5 % (ref 36.0–46.0)
Hemoglobin: 13 g/dL (ref 12.0–15.0)
Lymphocytes Relative: 21.6 % (ref 12.0–46.0)
Lymphs Abs: 1.6 10*3/uL (ref 0.7–4.0)
MCHC: 34.7 g/dL (ref 30.0–36.0)
MCV: 91 fL (ref 78.0–100.0)
Monocytes Absolute: 0.7 10*3/uL (ref 0.1–1.0)
Monocytes Relative: 9.6 % (ref 3.0–12.0)
Neutro Abs: 4.5 10*3/uL (ref 1.4–7.7)
Neutrophils Relative %: 62.5 % (ref 43.0–77.0)
Platelets: 241 10*3/uL (ref 150.0–400.0)
Potassium: 4.8 meq/L (ref 3.5–5.1)
RBC: 4.12 M/uL (ref 3.87–5.11)
RDW: 13.7 % (ref 11.5–14.6)
Sodium: 137 meq/L (ref 135–145)
TSH: 1.7 microintl units/mL (ref 0.35–5.50)
Total Bilirubin: 0.9 mg/dL (ref 0.3–1.2)
Total Protein: 6.2 g/dL (ref 6.0–8.3)
Vitamin B-12: 593 pg/mL (ref 211–911)
WBC: 7.3 10*3/uL (ref 4.5–10.5)

## 2010-03-30 ENCOUNTER — Telehealth: Payer: Self-pay | Admitting: Internal Medicine

## 2010-03-30 ENCOUNTER — Ambulatory Visit: Payer: Self-pay | Admitting: Sports Medicine

## 2010-04-11 ENCOUNTER — Telehealth: Payer: Self-pay | Admitting: Sports Medicine

## 2010-05-18 ENCOUNTER — Ambulatory Visit: Payer: Self-pay | Admitting: Sports Medicine

## 2010-05-18 DIAGNOSIS — M5137 Other intervertebral disc degeneration, lumbosacral region: Secondary | ICD-10-CM | POA: Insufficient documentation

## 2010-06-07 ENCOUNTER — Encounter: Payer: Self-pay | Admitting: Internal Medicine

## 2010-06-12 ENCOUNTER — Encounter: Admission: RE | Admit: 2010-06-12 | Discharge: 2010-06-12 | Payer: Self-pay | Admitting: Obstetrics and Gynecology

## 2010-06-22 ENCOUNTER — Telehealth: Payer: Self-pay | Admitting: Internal Medicine

## 2010-07-03 ENCOUNTER — Ambulatory Visit: Payer: Self-pay | Admitting: Sports Medicine

## 2010-07-03 DIAGNOSIS — M1612 Unilateral primary osteoarthritis, left hip: Secondary | ICD-10-CM | POA: Insufficient documentation

## 2010-07-17 ENCOUNTER — Encounter: Payer: Self-pay | Admitting: Internal Medicine

## 2010-07-20 ENCOUNTER — Ambulatory Visit: Payer: Self-pay | Admitting: Sports Medicine

## 2010-07-28 ENCOUNTER — Encounter
Admission: RE | Admit: 2010-07-28 | Discharge: 2010-08-24 | Payer: Self-pay | Source: Home / Self Care | Attending: Sports Medicine | Admitting: Sports Medicine

## 2010-08-18 ENCOUNTER — Telehealth (INDEPENDENT_AMBULATORY_CARE_PROVIDER_SITE_OTHER): Payer: Self-pay | Admitting: *Deleted

## 2010-08-25 ENCOUNTER — Telehealth: Payer: Self-pay | Admitting: Internal Medicine

## 2010-08-31 ENCOUNTER — Ambulatory Visit: Payer: Self-pay | Admitting: Sports Medicine

## 2010-09-06 ENCOUNTER — Ambulatory Visit
Admission: RE | Admit: 2010-09-06 | Discharge: 2010-09-06 | Payer: Self-pay | Source: Home / Self Care | Attending: Internal Medicine | Admitting: Internal Medicine

## 2010-09-06 DIAGNOSIS — M545 Low back pain, unspecified: Secondary | ICD-10-CM | POA: Insufficient documentation

## 2010-09-12 ENCOUNTER — Telehealth: Payer: Self-pay | Admitting: Internal Medicine

## 2010-09-13 ENCOUNTER — Ambulatory Visit: Admit: 2010-09-13 | Payer: Self-pay

## 2010-09-15 ENCOUNTER — Telehealth: Payer: Self-pay | Admitting: Internal Medicine

## 2010-09-19 ENCOUNTER — Telehealth (INDEPENDENT_AMBULATORY_CARE_PROVIDER_SITE_OTHER): Payer: Self-pay | Admitting: *Deleted

## 2010-09-20 ENCOUNTER — Ambulatory Visit
Admission: RE | Admit: 2010-09-20 | Discharge: 2010-09-20 | Payer: Self-pay | Source: Home / Self Care | Attending: Internal Medicine | Admitting: Internal Medicine

## 2010-09-20 ENCOUNTER — Ambulatory Visit: Admit: 2010-09-20 | Payer: Self-pay | Admitting: Internal Medicine

## 2010-09-20 ENCOUNTER — Telehealth: Payer: Self-pay | Admitting: Internal Medicine

## 2010-09-20 DIAGNOSIS — R059 Cough, unspecified: Secondary | ICD-10-CM | POA: Insufficient documentation

## 2010-09-20 DIAGNOSIS — J069 Acute upper respiratory infection, unspecified: Secondary | ICD-10-CM | POA: Insufficient documentation

## 2010-09-20 DIAGNOSIS — R05 Cough: Secondary | ICD-10-CM | POA: Insufficient documentation

## 2010-09-21 ENCOUNTER — Ambulatory Visit
Admission: RE | Admit: 2010-09-21 | Discharge: 2010-09-21 | Payer: Self-pay | Source: Home / Self Care | Attending: Sports Medicine | Admitting: Sports Medicine

## 2010-09-25 ENCOUNTER — Telehealth: Payer: Self-pay | Admitting: Internal Medicine

## 2010-10-01 ENCOUNTER — Encounter: Payer: Self-pay | Admitting: Neurosurgery

## 2010-10-02 ENCOUNTER — Encounter: Payer: Self-pay | Admitting: Obstetrics and Gynecology

## 2010-10-10 NOTE — Assessment & Plan Note (Signed)
Summary: GROIN PAIN/ PER AVP /NWS   Vital Signs:  Patient Profile:   76 Years Old Female Temp:     96.5 degrees F 59oral Pulse rate:   59 / minute BP sitting:   134 / 88  (left arm)  Vitals Entered By: Tora Perches (December 08, 2007 2:23 PM)                 Chief Complaint:  groin pain.  History of Present Illness: C/0 L groin pain since Fri, worse with sitting and getting up. Was working out prior: did lunges. Took a 1/2 Oxycod    Current Allergies (reviewed today): No known allergies   Past Medical History:    Reviewed history from 06/16/2007 and no changes required:       Colonic polyps, hx of       GERD       Hypertension                     Family hx   colon ca   Family History:    Family History Hypertension  Social History:    Married    Former Smoker    Regular exercise-yes   Risk Factors:  Tobacco use:  quit Exercise:  yes   Review of Systems  The patient denies fever, chest pain, syncope, dyspnea on exhertion, prolonged cough, abdominal pain, melena, hematochezia, and hematuria.     Physical Exam  General:     Well-developed,well-nourished,in no acute distress; alert,appropriate and cooperative throughout examination Head:     Normocephalic and atraumatic without obvious abnormalities. No apparent alopecia or balding. Mouth:     Oral mucosa and oropharynx without lesions or exudates.  Teeth in good repair. Neck:     No deformities, masses, or tenderness noted. Lungs:     Normal respiratory effort, chest expands symmetrically. Lungs are clear to auscultation, no crackles or wheezes. Heart:     Normal rate and regular rhythm. S1 and S2 normal without gallop, murmur, click, rub or other extra sounds. Abdomen:     Bowel sounds positive,abdomen soft and non-tender without masses, organomegaly or hernias noted.no inguinal hernia.   Sensitive in LLQ/groin with palp. and rom Msk:     L medial pros. thigh is tender Extremities:     No  clubbing, cyanosis, edema, or deformity noted with normal full range of motion of all joints.   Neurologic:     No cranial nerve deficits noted. Station and gait are normal. Plantar reflexes are down-going bilaterally. DTRs are symmetrical throughout. Sensory, motor and coordinative functions appear intact. Skin:     Intact without suspicious lesions or rashes Psych:     Cognition and judgment appear intact. Alert and cooperative with normal attention span and concentration. No apparent delusions, illusions, hallucinations    Impression & Recommendations:  Problem # 1:  ABDOMINAL PAIN (ICD-789.00) Assessment: New Poss. MSK. Rx given/rest. If fever -take Cipro. Labs/xray Orders: T-Pelvis 1or 2 views (72170TC) TLB-CBC Platelet - w/Differential (85025-CBCD) TLB-Udip ONLY (81003-UDIP) TLB-Sedimentation Rate (ESR) (85651-ESR)   Complete Medication List: 1)  Super Probiotic Caps (Misc intestinal flora regulat) .... Once daily 2)  Norvasc 5 Mg Tabs (Amlodipine besylate) .... Once daily 3)  Nexium 40 Mg Cpdr (Esomeprazole magnesium) .... Once daily 4)  Estradiol 1 Mg Tabs (Estradiol) .... Once daily 5)  Toprol Xl 25 Mg Tb24 (Metoprolol succinate) .... Once daily 6)  Multivitamins Tabs (Multiple vitamin) .... Once daily 7)  Magnesia 311 Mg Chew (  Magnesium hydroxide) .... Once daily 8)  Ativan 1 Mg Tabs (Lorazepam) .... At bedtime 9)  Zyrtec Allergy 10 Mg Tabs (Cetirizine hcl) .... Otc as needed 10)  Meloxicam 15 Mg Tabs (Meloxicam) .... One by mouth daily x 15 d then prn 11)  Percocet 5-325 Mg Tabs (Oxycodone-acetaminophen) .Marland Kitchen.. 1 by mouth qid as needed pain 12)  Cipro 500 Mg Tabs (Ciprofloxacin hcl) .Marland Kitchen.. 1 by mouth 2 times daily   Patient Instructions: 1)  Please schedule a follow-up appointment in 2 weeks.    Prescriptions: CIPRO 500 MG TABS (CIPROFLOXACIN HCL) 1 by mouth 2 times daily  #20 x 0   Entered and Authorized by:   Tresa Garter MD   Signed by:   Tresa Garter MD on 12/08/2007   Method used:   Print then Give to Patient   RxID:   [id number] PERCOCET 5-325 MG  TABS (OXYCODONE-ACETAMINOPHEN) 1 by mouth qid as needed pain  #60 x 0   Entered and Authorized by:   Tresa Garter MD   Signed by:   Tresa Garter MD on 12/08/2007   Method used:   Print then Give to Patient   RxID:   [id number] MELOXICAM 15 MG TABS (MELOXICAM) one by mouth daily x 15 d then prn  #30 x 2   Entered and Authorized by:   Tresa Garter MD   Signed by:   Tresa Garter MD on 12/08/2007   Method used:   Print then Give to Patient   RxID:   [id number]  ]

## 2010-10-10 NOTE — Letter (Signed)
Summary: [facility]   Imported By: Esmeralda Links D'jimraou 07/19/2008 09:01:58  _____________________________________________________________________  External Attachment:    Type:   Image     Comment:   External Document

## 2010-10-10 NOTE — Progress Notes (Signed)
Summary: [facility] Referral  Phone Note From Other Clinic   Caller: Referral Coordinator Reason for Call: Schedule Patient Appt Action Taken: Provider Notified Summary of Call: Dr Clayborne Artist from [facility] called said they called patient to schedule appointment,patient said that you said she did not need to have this appointment.Does she need appointment ? Initial call taken by: Dagoberto Reef,  November 03, 2008 3:50 PM  Follow-up for Phone Call        Let us go thru with the appt: the last kidney test on 2/18 came back borderline again (to be on the safe side)  Follow-up by: Tresa Garter MD,  November 03, 2008 4:03 PM

## 2010-10-10 NOTE — Progress Notes (Signed)
  Phone Note Call from Patient   Summary of Call: Diarrhea x3-4 d, husband had it too Initial call taken by: Tresa Garter MD,  August 12, 2009 12:07 PM  Follow-up for Phone Call        Pls call in Lomotil Cipro if not better She will get Peptobismol Follow-up by: Tresa Garter MD,  August 12, 2009 12:07 PM  Additional Follow-up for Phone Call Additional follow up Details #1::        Rx's called in pt aware Additional Follow-up by: Lamar Sprinkles, CMA,  August 12, 2009 12:53 PM    New/Updated Medications: LOMOTIL 2.5-0.025 MG TABS (DIPHENOXYLATE-ATROPINE) 1-2 by mouth qid as needed diarrhea CIPROFLOXACIN HCL 500 MG TABS (CIPROFLOXACIN HCL) 1 by mouth bid Prescriptions: CIPROFLOXACIN HCL 500 MG TABS (CIPROFLOXACIN HCL) 1 by mouth bid  #14 x 1   Entered by:   Lamar Sprinkles, CMA   Authorized by:   Tresa Garter MD   Signed by:   Lamar Sprinkles, CMA on 08/12/2009   Method used:   Telephoned to ...       [facility]* (retail)       [address]       [place], [place]  [address]       Ph: [phone]       Fax: [phone]   RxID:   [id number] LOMOTIL 2.5-0.025 MG TABS (DIPHENOXYLATE-ATROPINE) 1-2 by mouth qid as needed diarrhea  #60 x 1   Entered by:   Lamar Sprinkles, CMA   Authorized by:   Tresa Garter MD   Signed by:   Lamar Sprinkles, CMA on 08/12/2009   Method used:   Telephoned to ...       [facility]* (retail)       [address]       [place], [place]  [address]       Ph: [phone]       Fax: [phone]   RxID:   [id number]

## 2010-10-10 NOTE — Progress Notes (Signed)
Summary: Low BP  Phone Note Call from Patient   Summary of Call: Pt c/o feeling tired & low bp, 92/72. Pt had left vm on Dr Plotnikov's ext but did not get a call back so call triage at 4:35. She has felt  tired & lightheaded some today. Says this happens about 1 x qmth. Advised her to come in to be seen tomorrow. She said she would wait & call first thing if does not feel better tomorrow am. If ok she will come to regularly scheduled apt next week. Pt aware to go to ER if symptoms increased.  Initial call taken by: Lamar Sprinkles,  May 20, 2008 5:23 PM  Follow-up for Phone Call        seen at [facility] today Follow-up by: Corwin Levins MD,  May 21, 2008 6:26 PM

## 2010-10-10 NOTE — Assessment & Plan Note (Signed)
Summary: f/u per pt/#/cd   Vital Signs:  Patient profile:   76 year old female Height:      64 inches Weight:      166.25 pounds BMI:     28.64 O2 Sat:      95 % on Room air Temp:     97.6 degrees F oral Pulse rate:   59 / minute BP sitting:   144 / 100  (left arm) Cuff size:   regular  Vitals Entered By: Lucious Groves (February 15, 2010 11:26 AM)  O2 Flow:  Room air CC: Follow-up visit./kb, Hypertension Management Is Patient Diabetic? No Pain Assessment Patient in pain? no      Comments Patient notes she has had HA and one episode of dizziness./kb   CC:  Follow-up visit./kb and Hypertension Management.  History of Present Illness: The patient presents for a follow up of L groin pain (not better), anxiety, depression and headaches. Her groin felt better walking on the sand   Hypertension History:      Positive major cardiovascular risk factors include female age 46 years old or older and hypertension.  Negative major cardiovascular risk factors include non-tobacco-user status.     Current Medications (verified): 1)  Super Probiotic   Caps (Misc Intestinal Flora Regulat) .... Once Daily 2)  Nexium 40 Mg  Cpdr (Esomeprazole Magnesium) .... Once Daily 3)  Estradiol 1 Mg  Tabs (Estradiol) .... Once Daily 4)  Toprol Xl 25 Mg  Tb24 (Metoprolol Succinate) .... 1/2 By Mouth Bid 5)  Multivitamins   Tabs (Multiple Vitamin) .... Once Daily 6)  Ativan 1 Mg  Tabs (Lorazepam) .Marland Kitchen.. 1 At Bedtime Prn 7)  Zyrtec Allergy 10 Mg  Tabs (Cetirizine Hcl) .... Otc As Needed 8)  Pennsaid 1.5 % Soln (Diclofenac Sodium) .... 3-5 Gtt To Skin Three Times Daily 9)  Lomotil 2.5-0.025 Mg Tabs (Diphenoxylate-Atropine) .Marland Kitchen.. 1-2 By Mouth Qid As Needed Diarrhea 10)  Skelaxin 800 Mg Tabs (Metaxalone) .Marland Kitchen.. 1 By Mouth Two or Four Times A Day As Needed Back Spasm 11)  Sumatriptan Succinate 100 Mg Tabs (Sumatriptan Succinate) .Marland Kitchen.. 1 By Mouth Once Daily As Needed For Migraine 12)  Cymbalta 60 Mg Cpep (Duloxetine  Hcl) .Marland Kitchen.. 1 By Mouth Once Daily For Depression  Allergies (verified): No Known Drug Allergies  Past History:  Past Medical History: Last updated: 08/24/2009 Colonic polyps, hx of GERD Hypertension hx of brief SVT symptomatic PAC's Anxiety Allergic rhinitis IBS - constipation predominant Dr Kinnie Scales Diverticulosis, colon Gyn Dr Eda Paschal L hip bursitis Dr Wyline Mood, post-op seroma 2010 Partial old gluteus medius and minor tear on MRI 2010  Past Surgical History: Reviewed history from 06/14/2009 and no changes required. Hysterectomy Bladder surgury s/p breast reduction surgury Appendectomy L troch bursa resection 2010  Family History: Reviewed history from 05/21/2008 and no changes required. Family History Hypertension Colon cancer - mother  Social History: Reviewed history from 05/21/2008 and no changes required. 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  The patient denies fever, chest pain, syncope, abdominal pain, and muscle weakness.    Physical Exam  General:  Well Head:  NT No rash Nose:  External nasal examination shows no deformity or inflammation. Nasal mucosa are pink and moist without lesions or exudates. Mouth:  Oral mucosa and oropharynx without lesions or exudates.  Teeth in good repair. Lungs:  Normal respiratory effort, chest expands  symmetrically. Lungs are clear to auscultation, no crackles or wheezes. Heart:  Normal rate and regular rhythm. S1 and S2 normal without gallop, murmur, click, rub or other extra sounds. Abdomen:  soft and non-tender.   Msk:  L iliac crest is less tender to palp. antero-lat posteriorly, no mass. L groin is slightely tender to deep palpation Extremities:  No edema B Neurologic:  No cranial nerve deficits noted. Station and gait are normal. Plantar reflexes are down-going bilaterally. DTRs are symmetrical throughout.  Sensory, motor and coordinative functions appear intact. Skin:  Intact without suspicious lesions or rashes Psych:  Oriented X3, not anxious appearing, not depressed appearing, and tearful.     Impression & Recommendations:  Problem # 1:  HIP PAIN (ICD-719.45) L/ L groin pain - MSK - poss. piriformis syndrome. Assessment Unchanged Pain has started as torn medial glut rupture pain and later seems to have migrated from gluteal area to iliac crest and now to groin. Walking on sand helped - she was asked to try rocking Sketchers sandals and cont w/stretching.  Reduce Cymbalta dose Her updated medication list for this problem includes:    Skelaxin 800 Mg Tabs (Metaxalone) .Marland Kitchen... 1 by mouth two or four times a day as needed back spasm  Orders: Sports Medicine (Sports Med) Dr Darrick Penna  Problem # 2:  HYPOTENSION (ICD-458.9) Assessment: Improved  BP is up now  Orders: TLB-BMP (Basic Metabolic Panel-BMET) (80048-METABOL) TLB-CBC Platelet - w/Differential (85025-CBCD) TLB-Hepatic/Liver Function Pnl (80076-HEPATIC) TLB-TSH (Thyroid Stimulating Hormone) (84443-TSH) TLB-B12, Serum-Total ONLY (10272-Z36)  Problem # 3:  HYPERTENSION (ICD-401.9) Assessment: Comment Only Watch  BP at home Her updated medication list for this problem includes:    Toprol Xl 25 Mg Tb24 (Metoprolol succinate) .Marland Kitchen... 1/2 by mouth bid  Problem # 4:  SYNCOPE (ICD-780.2) Assessment: Comment Only  Resolved  Orders: TLB-BMP (Basic Metabolic Panel-BMET) (80048-METABOL) TLB-CBC Platelet - w/Differential (85025-CBCD) TLB-Hepatic/Liver Function Pnl (80076-HEPATIC) TLB-TSH (Thyroid Stimulating Hormone) (84443-TSH) TLB-B12, Serum-Total ONLY (64403-K74)  Complete Medication List: 1)  Super Probiotic Caps (Misc intestinal flora regulat) .... Once daily 2)  Nexium 40 Mg Cpdr (Esomeprazole magnesium) .... Once daily 3)  Estradiol 1 Mg Tabs (Estradiol) .... Once daily 4)  Toprol Xl 25 Mg Tb24 (Metoprolol succinate) .... 1/2  by mouth bid 5)  Multivitamins Tabs (Multiple vitamin) .... Once daily 6)  Ativan 1 Mg Tabs (Lorazepam) .Marland Kitchen.. 1 at bedtime prn 7)  Zyrtec Allergy 10 Mg Tabs (Cetirizine hcl) .... Otc as needed 8)  Pennsaid 1.5 % Soln (Diclofenac sodium) .... 3-5 gtt to skin three times daily 9)  Lomotil 2.5-0.025 Mg Tabs (Diphenoxylate-atropine) .Marland Kitchen.. 1-2 by mouth qid as needed diarrhea 10)  Skelaxin 800 Mg Tabs (Metaxalone) .Marland Kitchen.. 1 by mouth two or four times a day as needed back spasm 11)  Sumatriptan Succinate 100 Mg Tabs (Sumatriptan succinate) .Marland Kitchen.. 1 by mouth once daily as needed for migraine 12)  Cymbalta 30 Mg Cpep (Duloxetine hcl) .Marland Kitchen.. 1 by mouth once daily at hs  Hypertension Assessment/Plan:      The patient's hypertensive risk group is category B: At least one risk factor (excluding diabetes) with no target organ damage.  Her calculated 10 year risk of coronary heart disease is 8 %.  Today's blood pressure is 144/100.    Patient Instructions: 1)  Please schedule a follow-up appointment in 4 months. groiPrescriptions: CYMBALTA 30 MG CPEP (DULOXETINE HCL) 1 by mouth once daily at hs  #30 x 6   Entered and Authorized by:   Georgina Quint  Kvon Mcilhenny MD   Signed by:   Tresa Garter MD on 02/15/2010   Method used:   Print then Give to Patient   RxID:   [id number]

## 2010-10-10 NOTE — Assessment & Plan Note (Signed)
Summary: FU--PER PT  STC   Vital Signs:  Patient profile:   76 year old female Weight:      167 pounds Temp:     96.6 degrees F oral Pulse rate:   61 / minute BP sitting:   136 / 86  (left arm)  Vitals Entered By: Tora Perches (August 24, 2009 11:42 AM) CC: f/u Is Patient Diabetic? No   CC:  f/u.  History of Present Illness: C/o L iliac crest pain severe constant, pulling 4-10/10; was aggravated bu more activity w/her personal trainer lately Better w/walking and moving Worse w/standing  Preventive Screening-Counseling & Management  Alcohol-Tobacco     Smoking Status: quit  Current Medications (verified): 1)  Super Probiotic   Caps (Misc Intestinal Flora Regulat) .... Once Daily 2)  Nexium 40 Mg  Cpdr (Esomeprazole Magnesium) .... Once Daily 3)  Estradiol 1 Mg  Tabs (Estradiol) .... Once Daily 4)  Toprol Xl 25 Mg  Tb24 (Metoprolol Succinate) .... 1/2 By Mouth Bid 5)  Multivitamins   Tabs (Multiple Vitamin) .... Once Daily 6)  Ativan 1 Mg  Tabs (Lorazepam) .Marland Kitchen.. 1 At Bedtime Prn 7)  Zyrtec Allergy 10 Mg  Tabs (Cetirizine Hcl) .... Otc As Needed 8)  Vitamin D3 1000 Unit  Tabs (Cholecalciferol) .Marland Kitchen.. 1 By Mouth Daily 9)  Levsin 0.125 Mg Tabs (Hyoscyamine Sulfate) .Marland Kitchen.. 1-2 At [facility] As Needed 10)  Lidoderm 5 % Ptch (Lidocaine) .Marland Kitchen.. 1-3 Once Daily As Needed Pain 11)  Pennsaid 1.5 % Soln (Diclofenac Sodium) .... 3-5 Gtt To Skin Three Times Daily 12)  Tramadol Hcl 50 Mg  Tabs (Tramadol Hcl) .Marland Kitchen.. 1-2 By Mouth Qid As Needed Pain 13)  Lomotil 2.5-0.025 Mg Tabs (Diphenoxylate-Atropine) .Marland Kitchen.. 1-2 By Mouth Qid As Needed Diarrhea  Allergies (verified): No Known Drug Allergies  Past History:  Past Medical History: Colonic polyps, hx of GERD Hypertension hx of brief SVT symptomatic PAC's Anxiety Allergic rhinitis IBS - constipation predominant Dr Kinnie Scales Diverticulosis, colon Gyn Dr Eda Paschal L hip bursitis Dr Wyline Mood, post-op seroma 2010 Partial old gluteus medius and minor tear  on MRI 2010  Review of Systems       The patient complains of difficulty walking.  The patient denies fever and abdominal pain.    Physical Exam  General:  Well Nose:  External nasal examination shows no deformity or inflammation. Nasal mucosa are pink and moist without lesions or exudates. Mouth:  Oral mucosa and oropharynx without lesions or exudates.  Teeth in good repair. Lungs:  Normal respiratory effort, chest expands symmetrically. Lungs are clear to auscultation, no crackles or wheezes. Heart:  Normal rate and regular rhythm. S1 and S2 normal without gallop, murmur, click, rub or other extra sounds. Abdomen:  soft and non-tender.   Msk:  L iliac crest is tender to palp. antero-lat posteriorly, no mass Extremities:  No edema B Neurologic:  No cranial nerve deficits noted. Station and gait are normal. Plantar reflexes are down-going bilaterally. DTRs are symmetrical throughout. Sensory, motor and coordinative functions appear intact. Skin:  Intact without suspicious lesions or rashes   Impression & Recommendations:  Problem # 1:  HIP PAIN (ICD-719.45) L iliac crest pain Assessment Comment Only Had an MRI at [facility] on Dec 4th: old partial tear of gluteus medius and minor and a post-op seroma on L Her updated medication list for this problem includes:    Tramadol Hcl 50 Mg Tabs (Tramadol hcl) .Marland Kitchen... 1-2 by mouth qid as needed pain    Skelaxin  800 Mg Tabs (Metaxalone) .Marland Kitchen... 1 by mouth two or four times a day as needed back spasm See "Patient Instructions". F/u w/ortho. I left a VM. The office visit took longer than 20 min with patient councelling for more than 50% of the 20 min    Problem # 2:  RENAL INSUFFICIENCY (ICD-588.9) Assessment: Unchanged  Problem # 3:  HYPERTENSION (ICD-401.9) Assessment: Comment Only  Her updated medication list for this problem includes:    Toprol Xl 25 Mg Tb24 (Metoprolol succinate) .Marland Kitchen... 1/2 by mouth bid  Complete Medication List: 1)   Super Probiotic Caps (Misc intestinal flora regulat) .... Once daily 2)  Nexium 40 Mg Cpdr (Esomeprazole magnesium) .... Once daily 3)  Estradiol 1 Mg Tabs (Estradiol) .... Once daily 4)  Toprol Xl 25 Mg Tb24 (Metoprolol succinate) .... 1/2 by mouth bid 5)  Multivitamins Tabs (Multiple vitamin) .... Once daily 6)  Ativan 1 Mg Tabs (Lorazepam) .Marland Kitchen.. 1 at bedtime prn 7)  Zyrtec Allergy 10 Mg Tabs (Cetirizine hcl) .... Otc as needed 8)  Vitamin D3 1000 Unit Tabs (Cholecalciferol) .Marland Kitchen.. 1 by mouth daily 9)  Levsin 0.125 Mg Tabs (Hyoscyamine sulfate) .Marland Kitchen.. 1-2 at hs as needed 10)  Lidoderm 5 % Ptch (Lidocaine) .Marland Kitchen.. 1-3 once daily as needed pain 11)  Pennsaid 1.5 % Soln (Diclofenac sodium) .... 3-5 gtt to skin three times daily 12)  Tramadol Hcl 50 Mg Tabs (Tramadol hcl) .Marland Kitchen.. 1-2 by mouth qid as needed pain 13)  Lomotil 2.5-0.025 Mg Tabs (Diphenoxylate-atropine) .Marland Kitchen.. 1-2 by mouth qid as needed diarrhea 14)  Skelaxin 800 Mg Tabs (Metaxalone) .Marland Kitchen.. 1 by mouth two or four times a day as needed back spasm 15)  Ceftin 500 Mg Tabs (Cefuroxime axetil) .Marland Kitchen.. 1 by mouth bid  Patient Instructions: 1)  Gentle stretching to cont 2)  Lidoderm as needed  Prescriptions: CEFTIN 500 MG TABS (CEFUROXIME AXETIL) 1 by mouth bid  #20 x 1   Entered and Authorized by:   Tresa Garter MD   Signed by:   Tresa Garter MD on 08/24/2009   Method used:   Print then Give to Patient   RxID:   [id number] SKELAXIN 800 MG TABS (METAXALONE) 1 by mouth two or four times a day as needed back spasm  #60 x 6   Entered and Authorized by:   Tresa Garter MD   Signed by:   Tresa Garter MD on 08/24/2009   Method used:   Print then Give to Patient   RxID:   [id number]

## 2010-10-10 NOTE — Progress Notes (Signed)
Summary: Surgery Clear.  Phone Note Call from Patient Call back at Home Phone [phone] Call back at [phone]   Caller: Patient Summary of Call: Patient would like to know when she can be cleared for Hip surgery. She will be seeing Dr. Lavella Hammock. Pls Fax clearence to [phone], Attn. Olegario Messier.   Initial call taken by: Beola Cord, CMA,  March 29, 2009 3:02 PM  Follow-up for Phone Call        OK. Clear for surgery. OV if issues since her last visit. Follow-up by: Tresa Garter MD,  March 30, 2009 8:01 AM  Additional Follow-up for Phone Call Additional follow up Details #1::        Called pt md approve clearance. also letter was faxed to Dr Wyline Mood office by md nurse this am Additional Follow-up by: Orlan Leavens,  March 30, 2009 1:51 PM

## 2010-10-10 NOTE — Letter (Signed)
Summary: Generic Letter  [facility]  [address] [place], [place] [address]   Phone: [phone]  Fax: [phone]    03/31/2009  RE: Hailey [name] [address] [place], [place]  [address]    Dear Dr Thurston Hole,  [name] was seen by myself last on 12/01/08. She should be medically clear for her outpatient hip bursa surgery. I would recommend to observe her for an extra couple hours after surgery.  Thank you!         Sincerely,   Jacinta Shoe MD

## 2010-10-10 NOTE — Letter (Signed)
Summary: [facility]  [facility]   Imported By: Lester [place] 09/02/2008 12:50:01  _____________________________________________________________________  External Attachment:    Type:   Image     Comment:   External Document

## 2010-10-10 NOTE — Progress Notes (Signed)
Summary: toprol  Phone Note Other Incoming Call back at fax   Caller: [place] Summary of Call: refill on toprol  ok x 12 refills Initial call taken by: Tora Perches,  October 19, 2009 12:50 PM    Prescriptions: TOPROL XL 25 MG  TB24 (METOPROLOL SUCCINATE) 1/2 by mouth bid  #30 x 12   Entered by:   Tora Perches   Authorized by:   Tresa Garter MD   Signed by:   Tora Perches on 10/19/2009   Method used:   Electronically to        [facility]* (retail)       [address]       [place], [place]  [id number]       Ph: [phone]       Fax: [phone]   RxID:   [id number]

## 2010-10-10 NOTE — Letter (Signed)
Summary: [facility]   Imported By: Sherian Rein 10/10/2009 07:55:54  _____________________________________________________________________  External Attachment:    Type:   Image     Comment:   External Document

## 2010-10-10 NOTE — Letter (Signed)
Summary: [facility]  [facility]   Imported By: Sherian Rein 08/01/2010 08:19:48  _____________________________________________________________________  External Attachment:    Type:   Image     Comment:   External Document

## 2010-10-10 NOTE — Assessment & Plan Note (Signed)
Summary: FU L HIP PAIN   Vital Signs:  Patient profile:   76 year old female BP sitting:   148 / 94  Vitals Entered By: Lillia Pauls CMA (May 18, 2010 9:08 AM)  History of Present Illness: Patient here for follow up of hip issues has brought MRI today for my review she has definitely seen an improvement i strength still has pain over left hip greater toroch area - not much change during day however, now she can walk and exercise without this hurting worse rest of day if she tries to increase exercises or walking too fast pain returns  Allergies: No Known Drug Allergies  Physical Exam  General:  Well-developed,well-nourished,in no acute distress; alert,appropriate and cooperative throughout examination Msk:  Markelgy weak hip abduction on left mildly weak hip rotation on left hip on RT is strong in abd, flex, rotation left hip is strong in flexion gait shows less trendelenburg now able to do step up exercises without loss of balance  joint ROM is OK   Impression & Recommendations:  Problem # 1:  HIP PAIN (ICD-719.45)  Her updated medication list for this problem includes:    Skelaxin 800 Mg Tabs (Metaxalone) .Marland Kitchen... 1 by mouth two or four times a day as needed back spasm    Tramadol Hcl 50 Mg Tabs (Tramadol hcl) ..... One tab by mouth q6h as needed for pain  I think she has made good progress but pain relief will take time and a lot more strngth progress rehab program w step exercises and inc walking use more tramadol 1/2 tab helps and does not make her fuzzy so use low dose more often  reck 6 to 8 weeks  consider [place] scan of greater troch area if pain not down more  Problem # 2:  DEGENERATIVE DISC DISEASE, LUMBOSACRAL SPINE (ICD-722.52) Review of MRI reveals foraminal stenosis lots of DDD probable localized area of spinal stenosis L4/5 lot of change at L1/2 as well  suspect this is neurogenic compression area that led to hip issues  Complete Medication  List: 1)  Super Probiotic Caps (Misc intestinal flora regulat) .... Once daily 2)  Nexium 40 Mg Cpdr (Esomeprazole magnesium) .... Once daily 3)  Estradiol 1 Mg Tabs (Estradiol) .... Once daily 4)  Toprol Xl 25 Mg Tb24 (Metoprolol succinate) .... 1/2 by mouth bid 5)  Multivitamins Tabs (Multiple vitamin) .... Once daily 6)  Ativan 1 Mg Tabs (Lorazepam) .Marland Kitchen.. 1 at bedtime prn 7)  Zyrtec Allergy 10 Mg Tabs (Cetirizine hcl) .... Otc as needed 8)  Pennsaid 1.5 % Soln (Diclofenac sodium) .... 3-5 gtt to skin three times daily 9)  Lomotil 2.5-0.025 Mg Tabs (Diphenoxylate-atropine) .Marland Kitchen.. 1-2 by mouth qid as needed diarrhea 10)  Skelaxin 800 Mg Tabs (Metaxalone) .Marland Kitchen.. 1 by mouth two or four times a day as needed back spasm 11)  Sumatriptan Succinate 100 Mg Tabs (Sumatriptan succinate) .Marland Kitchen.. 1 by mouth once daily as needed for migraine 12)  Cymbalta 30 Mg Cpep (Duloxetine hcl) .Marland Kitchen.. 1 by mouth once daily at hs 13)  Tramadol Hcl 50 Mg Tabs (Tramadol hcl) .... One tab by mouth q6h as needed for pain

## 2010-10-10 NOTE — Letter (Signed)
Summary: [facility]  [facility]   Imported By: Lester Roe 09/22/2009 08:44:43  _____________________________________________________________________  External Attachment:    Type:   Image     Comment:   External Document

## 2010-10-10 NOTE — Miscellaneous (Signed)
Summary: AMLODIPINE   Clinical Lists Changes  Medications: Rx of NORVASC 5 MG  TABS (AMLODIPINE BESYLATE) once daily;  #30 x 3;  Signed;  Entered by: Orlan Leavens;  Authorized by: Tresa Garter MD;  Method used: Telephoned to [facility]*, [address], [place], [place], [place]  [address], Ph: [phone] or [phone], Fax: [phone]    Prescriptions: NORVASC 5 MG  TABS (AMLODIPINE BESYLATE) once daily  #30 x 3   Entered by:   Orlan Leavens   Authorized by:   Tresa Garter MD   Signed by:   Orlan Leavens on 10/15/2007   Method used:   Telephoned to ...       [facility]*       [address]       [place], [place]  [address]       Ph: [phone] or [phone]       Fax: [phone]   RxID:   [id number]

## 2010-10-10 NOTE — Medication Information (Signed)
Summary: [facility]  [facility]   Imported By: Sherian Rein 11/14/2009 08:50:55  _____________________________________________________________________  External Attachment:    Type:   Image     Comment:   External Document

## 2010-10-10 NOTE — Letter (Signed)
Summary: Generic Letter  [facility]  [address] [place], [place] [address]   Phone: [phone]  Fax: [phone]    03/30/2009  RE: [facility] [address] [place], [place]  [address]  Dear Dr Fredric Mare,  Ms. Bracamonte was seen by myself last on 12/01/08. She should be medically clear for hip surgery. I would recommend telemetry for a couple days after surgery. She was asked to see me if any medical issues have developed since March.  Thank you!           Sincerely,   Jacinta Shoe MD

## 2010-10-10 NOTE — Letter (Signed)
Summary: [facility] PT Referral   [facility] PT Referral   Imported By: Marily Memos 07/21/2010 10:00:29  _____________________________________________________________________  External Attachment:    Type:   Image     Comment:   External Document

## 2010-10-10 NOTE — Assessment & Plan Note (Signed)
Summary: NP,HIP PAIN,MC   Vital Signs:  Patient profile:   76 year old female Height:      64 inches Weight:      167.25 pounds BMI:     28.81 Pulse rate:   85 / minute BP sitting:   151 / 85  (right arm)  Vitals Entered By: Terese Door (March 30, 2010 1:41 PM) CC: LEFT HIP PAIN Pain Assessment Patient in pain? yes     Location: hip Intensity: 4   CC:  LEFT HIP PAIN.  History of Present Illness: 76 yo female with L hip pain since Nov 2009.  She has had a complicated orthopaedic history with L trochanteric bursectomy at [facility], L ASIS injection that was equivocal, Bulging L1/L2 disc with equivocal improvement with epidural injection.  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    Now with pain in the anterior hip near ASIS worse with walking 3-4 mins, occasional POP/Snap.  Pain worse with flexion.  No recent trauma, no recent increase in physical activity.  Current Medications (verified): 1)  Super Probiotic   Caps (Misc Intestinal Flora Regulat) .... Once Daily 2)  Nexium 40 Mg  Cpdr (Esomeprazole Magnesium) .... Once Daily 3)  Estradiol 1 Mg  Tabs (Estradiol) .... Once Daily 4)  Toprol Xl 25 Mg  Tb24 (Metoprolol Succinate) .... 1/2 By Mouth Bid 5)  Multivitamins   Tabs (Multiple Vitamin) .... Once Daily 6)  Ativan 1 Mg  Tabs (Lorazepam) .Marland Kitchen.. 1 At Bedtime Prn 7)  Zyrtec Allergy 10 Mg  Tabs (Cetirizine Hcl) .... Otc As Needed 8)  Pennsaid 1.5 % Soln (Diclofenac Sodium) .... 3-5 Gtt To Skin Three Times Daily 9)  Lomotil 2.5-0.025 Mg Tabs (Diphenoxylate-Atropine) .Marland Kitchen.. 1-2 By Mouth Qid As Needed Diarrhea 10)  Skelaxin 800 Mg Tabs (Metaxalone) .Marland Kitchen.. 1 By Mouth Two or Four Times A Day As Needed Back Spasm 11)  Sumatriptan Succinate 100 Mg Tabs (Sumatriptan Succinate) .Marland Kitchen.. 1 By Mouth Once Daily As Needed For Migraine 12)  Cymbalta 30 Mg Cpep (Duloxetine Hcl) .Marland Kitchen.. 1 By Mouth Once Daily At [facility] 13)  Tramadol Hcl 50 Mg Tabs (Tramadol Hcl) .... One Tab  By Mouth Q6h As Needed For Pain  Allergies (verified): No Known Drug Allergies  Review of Systems       See HPI  Physical Exam  General:  Well-developed,well-nourished,in no acute distress; alert,appropriate and cooperative throughout examination Msk:  Hip:L ROM IR: 45 Deg, ER: 45 Deg, Flexion: 120 Deg, Extension: 100 Deg, Abduction: 45 Deg, Adduction: 45 Deg Strength IR: 5/5, ER: 5/5, Flexion: 5/5, Extension: 5/5, Abduction: 4-/5, Adduction: 5/5 Hip rotation weak on left as well but less so Pelvic alignment unremarkable to inspection and palpation. Standing hip rotation and gait without trendelenburg / unsteadiness. Greater trochanter without tenderness to palpation. No tenderness over piriformis and greater trochanter. No SI joint tenderness and normal minimal SI movement.  Painful over ASIS.  Knee exam WNL Right hip/kne unremarkable.   Impression & Recommendations:  Problem # 1:  HIP PAIN (ICD-719.45) Assessment Unchanged She is extremely weak with her L hip abductors.  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  Her hip pain now appears to be coming from kinetic chain imbalance with overexertion of muscles originating from the ASIS. -Exercise handout given. -Eight of each exercises daily for the 1st week, then eight two times a day for the 2nd week, eight three times a day for the 3rd week,  10 three times a day for the 4th week, 12 three times a day for the fifth week, 15 three times a day for the sixth week. -Stop if painful. -Walk 4 mins then rest 2 mins, then walk another 4 minutes.  Do this daily for the first week.  Then 6-2-6 the 2nd week, then 8-2-8 the 3rd week, then 06-11-09 the 4th week. -Tramadol for pain. -RTC 6 weeks.  Her updated medication list for this problem includes:    Skelaxin 800 Mg Tabs (Metaxalone) .Marland Kitchen... 1 by mouth two or four times a day as needed back spasm    Tramadol Hcl 50 Mg Tabs  (Tramadol hcl) ..... One tab by mouth q6h as needed for pain  Complete Medication List: 1)  Super Probiotic Caps (Misc intestinal flora regulat) .... Once daily 2)  Nexium 40 Mg Cpdr (Esomeprazole magnesium) .... Once daily 3)  Estradiol 1 Mg Tabs (Estradiol) .... Once daily 4)  Toprol Xl 25 Mg Tb24 (Metoprolol succinate) .... 1/2 by mouth bid 5)  Multivitamins Tabs (Multiple vitamin) .... Once daily 6)  Ativan 1 Mg Tabs (Lorazepam) .Marland Kitchen.. 1 at bedtime prn 7)  Zyrtec Allergy 10 Mg Tabs (Cetirizine hcl) .... Otc as needed 8)  Pennsaid 1.5 % Soln (Diclofenac sodium) .... 3-5 gtt to skin three times daily 9)  Lomotil 2.5-0.025 Mg Tabs (Diphenoxylate-atropine) .Marland Kitchen.. 1-2 by mouth qid as needed diarrhea 10)  Skelaxin 800 Mg Tabs (Metaxalone) .Marland Kitchen.. 1 by mouth two or four times a day as needed back spasm 11)  Sumatriptan Succinate 100 Mg Tabs (Sumatriptan succinate) .Marland Kitchen.. 1 by mouth once daily as needed for migraine 12)  Cymbalta 30 Mg Cpep (Duloxetine hcl) .Marland Kitchen.. 1 by mouth once daily at hs 13)  Tramadol Hcl 50 Mg Tabs (Tramadol hcl) .... One tab by mouth q6h as needed for pain  Patient Instructions: 1)  Home exercises for your weak hip, see attached handout. 2)  Do eight of each exercise daily for the 1st week, then eight two times a day for the 2nd week, eight three times a day for the 3rd week, 10 three times a day for the 4th week, 12 three times a day for the fifth week, 15 three times a day for the sixth week. 3)  Stop if you develop pain. 4)  Walk 4 mins then rest 2 mins, then walk another 4 minutes.  Do this daily for the first week.  Then 6-2-6 the 2nd week, then 8-2-8 the 3rd week, then 06-11-09 the 4th week. 5)  Tramadol for pain. 6)  Come back to see [place] in 6 weeks. 7)  -Dr. Karie Schwalbe. Prescriptions: TRAMADOL HCL 50 MG TABS (TRAMADOL HCL) One tab by mouth q6h as needed for pain  #60 x 0   Entered by:   Rodney Langton MD   Authorized by:   Enid Baas MD   Signed by:   Rodney Langton MD on  03/30/2010   Method used:   Electronically to        [facility]* (retail)       [address]       [place], [place]  [id number]       Ph: [phone]       Fax: [phone]   RxID:   [id number]

## 2010-10-10 NOTE — Assessment & Plan Note (Signed)
Summary: F/U,MC   Vital Signs:  Patient profile:   76 year old female Pulse rate:   67 / minute BP sitting:   137 / 91  (right arm) CC: f/u left hip pain   CC:  f/u left hip pain.  History of Present Illness: Hailey [name] comes in today to F/U oher left ASIS pain she has had it for a long time. She was recomended hip stregntening exercise in the past with no improvement.She feels the pain on daily basis, 4/10 intensity, located on her left ASIS. No radiated. No numbness or tiglin . Motion makes it worse. Tramadol helps with the pain. She also has a lumbar spinal stenosis at the level of L1. She has seen an spine specialist for that.No real  sxs of radiation around the distribution of L1 nerve root at present  Allergies: No Known Drug Allergies  Physical Exam  General:  Well-developed,well-nourished,in no acute distress; alert,appropriate and cooperative throughout examination Additional Exam:  MSK [place]  hypoechoic change at insertion of sartorius tendon into ASIS there is a marked increase in doppler activity no tear of tendon on trans scan hypoechoic area looks like bursa or psuedo bursa change   Hip Exam  General:    Well-developed,well-nourished,normal body habitus;no deformities, normal grooming. Full ROM for flexion , exetension, internal , external rotation , adduction and abduction. Decrease streghnt IV/V for left hip internal rotation.  Gait:    Normal heel-toe gait pattern bilaterally.    Inspection:    No deformity, ecchymosis or swelling.   Palpation:    tender to palpation over the left ASIS No tenderness to palpation to distal leg, medial and lateral ankle, distal achilles tendon, medial and lateral hindfoot, posterior heel, plantar heel, midfoot and forefoot bilaterally.   Vascular:    There was no swelling or varicose veins.The pulses and temperature is normal.There was no edema or tenderness.   Detailed Back/Spine Exam  Skin:    Intact with no  erythema; no scarring.    Inspection:    Full ROM for flexion, extension with mild tenderness, lateral motion and rotation.  plantigrade foot with normal alignment of leg, ankle, hindfoot, and foot.   Palpation:    non-tender to palpation over distal leg, medial and lateral ankle, distal achilles tendon, medial and lateral hindfoot, posterior heel, plantar heel, midfoot and forefoot bilaterally.   Vascular:    dorsalis pedis and posterior tibial pulses 2+ and symmetric, capillary refill < 2 seconds, normal hair pattern, no evidence of ischemia.    Impression & Recommendations:  Problem # 1:  BURSITIS, LEFT HIP (ICD-726.5)  U/S examination revealed swellin and fluid in the sartorious bursa. Intrabursla injection with cortisone was given and well tolerated by the patient. Recomended strengtening exercise for the sartorious tendon. f/u IN 1 MONTH. Cont tramadol   cleanse with alcohol Topical analgesic spray : Ethyl choride Joint left ant hip at ASIS Approached in typical fashion with:  lat approach to ASUS and infiltrated in region Completed without difficulty Meds:1 cc kenalog 10 and 3 cc lidocaine  25 G and 1.5 in Aftercare instructions and Red flags advised  note post injection pain resolved completely so suspect location was correct and on post scan this looked to be case  Orders: [place] LIMITED ([address]) Trigger Point Injection Single Tendon Origin/Insertion (60454) Kenalog 10 mg inj (J3301)  Problem # 2:  HIP PAIN (ICD-719.45)  Her updated medication list for this problem includes:    Skelaxin 800 Mg Tabs (Metaxalone) .Marland KitchenMarland KitchenMarland KitchenMarland Kitchen  1 by mouth two or four times a day as needed back spasm    Tramadol Hcl 50 Mg Tabs (Tramadol hcl) ..... One tab by mouth q6h as needed for pain   will keep up motion exercises  advised hip rotation, abduction, step up and lat dtep up  Orders: [place] LIMITED ([address])  Complete Medication List: 1)  Super Probiotic Caps (Misc intestinal flora regulat)  .... Once daily 2)  Nexium 40 Mg Cpdr (Esomeprazole magnesium) .... Once daily 3)  Estradiol 1 Mg Tabs (Estradiol) .... Once daily 4)  Toprol Xl 25 Mg Tb24 (Metoprolol succinate) .... 1/2 by mouth bid 5)  Multivitamins Tabs (Multiple vitamin) .... Once daily 6)  Ativan 1 Mg Tabs (Lorazepam) .Marland Kitchen.. 1 at bedtime prn 7)  Zyrtec Allergy 10 Mg Tabs (Cetirizine hcl) .... Otc as needed 8)  Pennsaid 1.5 % Soln (Diclofenac sodium) .... 3-5 gtt to skin three times daily 9)  Lomotil 2.5-0.025 Mg Tabs (Diphenoxylate-atropine) .Marland Kitchen.. 1-2 by mouth qid as needed diarrhea 10)  Skelaxin 800 Mg Tabs (Metaxalone) .Marland Kitchen.. 1 by mouth two or four times a day as needed back spasm 11)  Sumatriptan Succinate 100 Mg Tabs (Sumatriptan succinate) .Marland Kitchen.. 1 by mouth once daily as needed for migraine 12)  Cymbalta 30 Mg Cpep (Duloxetine hcl) .Marland Kitchen.. 1 by mouth once daily at hs 13)  Tramadol Hcl 50 Mg Tabs (Tramadol hcl) .... One tab by mouth q6h as needed for pain   Orders Added: 1)  Est. Patient Level III [91478] 2)  [place] LIMITED [[address]562] 3)  Trigger Point Injection Single Tendon Origin/Insertion [20551] 4)  Kenalog 10 mg inj [J3301]  Appended Document: F/U,MC more hip pain in the area of injury before after going to [place] and doing a lot of walking;  advised to get back to using tramadol;  we should reck to be sure no new injury or worsening of bursal swelling.

## 2010-10-10 NOTE — Progress Notes (Signed)
Summary: Questions about exercises   Left message on pt. phone about exercise questions below.  ---- Converted from flag ---- ---- 04/10/2010 5:36 PM, Rodney Langton MD wrote: She may use the treadmill as long as not painful.  She should not let her treadmill exercises get in the way of the hip exercises however, hip rehab exercises take priority.  If she can do both, then great.  She also may do upper body exercises as much as she wants. Thanks! -Dr. Karie Schwalbe.  ---- 04/10/2010 12:51 PM, Terese Door wrote: Can she use the treadmill when she can't get outside to walk? Can she do upper body exercises with machines at gym?  Please advise. I told her I thought both would be okay but that I would check with you and call her back.  Lupita Leash  ---- 04/10/2010 12:17 PM, Rodney Langton MD wrote: Can you ask her what her specific ?'s are, [facility]  Thanks. -Dr. Karie Schwalbe.  ---- 04/10/2010 10:41 AM, Terese Door wrote:   ---- 04/10/2010 10:31 AM, Marily Memos wrote: Pt has concerns about the exercises that Dr T has her doing, she would like a call back at [phone]. ------------------------------

## 2010-10-10 NOTE — Progress Notes (Signed)
Summary: Rf Lorazepam  Phone Note Refill Request Message from:  Fax from Pharmacy  Refills Requested: Medication #1:  ATIVAN 1 MG  TABS 1 at bedtime prn   Dosage confirmed as above?Dosage Confirmed   Supply Requested: 90   Last Refilled: 03/31/2010  Method Requested: Telephone to Pharmacy Next Appointment Scheduled: None Initial call taken by: Lanier Prude, [facility]),  June 22, 2010 1:40 PM  Follow-up for Phone Call        ok 3 ref Follow-up by: Tresa Garter MD,  June 22, 2010 3:57 PM    Prescriptions: ATIVAN 1 MG  TABS (LORAZEPAM) 1 at bedtime prn  #90 x 0   Entered by:   Alysia Penna   Authorized by:   Tresa Garter MD   Signed by:   Alysia Penna on 06/22/2010   Method used:   Telephoned to ...       [facility]* (retail)       [address]       [place], [place]  [id number]       Ph: [phone]       Fax: [phone]   RxID:   [id number]

## 2010-10-10 NOTE — Letter (Signed)
Summary: [facility]  [facility]   Imported By: Lester Stovall 09/23/2009 08:42:49  _____________________________________________________________________  External Attachment:    Type:   Image     Comment:   External Document

## 2010-10-10 NOTE — Assessment & Plan Note (Signed)
Summary: 1 MO ROV /NWS  #   Vital Signs:  Patient profile:   76 year old female Weight:      169 pounds Temp:     97.8 degrees F oral Pulse rate:   57 / minute BP sitting:   132 / 90  (left arm)  Vitals Entered By: Tora Perches (December 01, 2009 9:40 AM) CC: f/u Is Patient Diabetic? No   CC:  f/u.  History of Present Illness: F/u HA - better w/diet coke at lunch F/u depression ,anxiety, HTN- better F/u LBP/hip pain - better  Current Medications (verified): 1)  Super Probiotic   Caps (Misc Intestinal Flora Regulat) .... Once Daily 2)  Nexium 40 Mg  Cpdr (Esomeprazole Magnesium) .... Once Daily 3)  Estradiol 1 Mg  Tabs (Estradiol) .... Once Daily 4)  Toprol Xl 25 Mg  Tb24 (Metoprolol Succinate) .... 1/2 By Mouth Bid 5)  Multivitamins   Tabs (Multiple Vitamin) .... Once Daily 6)  Ativan 1 Mg  Tabs (Lorazepam) .Marland Kitchen.. 1 At Bedtime Prn 7)  Zyrtec Allergy 10 Mg  Tabs (Cetirizine Hcl) .... Otc As Needed 8)  Pennsaid 1.5 % Soln (Diclofenac Sodium) .... 3-5 Gtt To Skin Three Times Daily 9)  Lomotil 2.5-0.025 Mg Tabs (Diphenoxylate-Atropine) .Marland Kitchen.. 1-2 By Mouth Qid As Needed Diarrhea 10)  Skelaxin 800 Mg Tabs (Metaxalone) .Marland Kitchen.. 1 By Mouth Two or Four Times A Day As Needed Back Spasm 11)  Sumatriptan Succinate 100 Mg Tabs (Sumatriptan Succinate) .Marland Kitchen.. 1 By Mouth Once Daily As Needed For Migraine  Allergies (verified): No Known Drug Allergies  Past History:  Past Medical History: Last updated: 08/24/2009 Colonic polyps, hx of GERD Hypertension hx of brief SVT symptomatic PAC's Anxiety Allergic rhinitis IBS - constipation predominant Dr Kinnie Scales Diverticulosis, colon Gyn Dr Eda Paschal L hip bursitis Dr Wyline Mood, post-op seroma 2010 Partial old gluteus medius and minor tear on MRI 2010  Past Surgical History: Last updated: 06/14/2009 Hysterectomy Bladder surgury s/p breast reduction surgury Appendectomy L troch bursa resection 2010  Social History: Last updated:  05/21/2008 Married Former Smoker Regular exercise-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    Physical Exam  General:  Well Mouth:  Oral mucosa and oropharynx without lesions or exudates.  Teeth in good repair. Abdomen:  soft and non-tender.   Msk:  L iliac crest is tender to palp. antero-lat posteriorly, no mass Neurologic:  No cranial nerve deficits noted. Station and gait are normal. Plantar reflexes are down-going bilaterally. DTRs are symmetrical throughout. Sensory, motor and coordinative functions appear intact. Skin:  Intact without suspicious lesions or rashes Psych:  Oriented X3, not anxious appearing, not depressed appearing, and tearful.     Impression & Recommendations:  Problem # 1:  HEADACHE (ICD-784.0) Assessment Improved  The following medications were removed from the medication list:    Nucynta 100 Mg Tabs (Tapentadol hcl) .Marland Kitchen... 1 by mouth two times a day as needed pain Her updated medication list for this problem includes:    Toprol Xl 25 Mg Tb24 (Metoprolol succinate) .Marland Kitchen... 1/2 by mouth bid    Sumatriptan Succinate 100 Mg Tabs (Sumatriptan succinate) .Marland Kitchen... 1 by mouth once daily as needed for migraine  Problem # 2:  RENAL INSUFFICIENCY (ICD-588.9) Assessment: Improved  Problem # 3:  HIP PAIN (ICD-719.45) Assessment: Improved  The following medications were removed from the medication list:    Nucynta 100 Mg Tabs (Tapentadol hcl) .Marland KitchenMarland KitchenMarland KitchenMarland Kitchen  1 by mouth two times a day as needed pain Her updated medication list for this problem includes:    Skelaxin 800 Mg Tabs (Metaxalone) .Marland Kitchen... 1 by mouth two or four times a day as needed back spasm  Problem # 4:  HYPERTENSION (ICD-401.9) Assessment: Improved  Her updated medication list for this problem includes:    Toprol Xl 25 Mg Tb24 (Metoprolol succinate) .Marland Kitchen... 1/2 by mouth bid  Problem # 5:  ANXIETY  (ICD-300.00) Assessment: Improved  Her updated medication list for this problem includes:    Ativan 1 Mg Tabs (Lorazepam) .Marland Kitchen... 1 at bedtime prn    Cymbalta 60 Mg Cpep (Duloxetine hcl) .Marland Kitchen... 1 by mouth once daily for depression  Complete Medication List: 1)  Super Probiotic Caps (Misc intestinal flora regulat) .... Once daily 2)  Nexium 40 Mg Cpdr (Esomeprazole magnesium) .... Once daily 3)  Estradiol 1 Mg Tabs (Estradiol) .... Once daily 4)  Toprol Xl 25 Mg Tb24 (Metoprolol succinate) .... 1/2 by mouth bid 5)  Multivitamins Tabs (Multiple vitamin) .... Once daily 6)  Ativan 1 Mg Tabs (Lorazepam) .Marland Kitchen.. 1 at bedtime prn 7)  Zyrtec Allergy 10 Mg Tabs (Cetirizine hcl) .... Otc as needed 8)  Pennsaid 1.5 % Soln (Diclofenac sodium) .... 3-5 gtt to skin three times daily 9)  Lomotil 2.5-0.025 Mg Tabs (Diphenoxylate-atropine) .Marland Kitchen.. 1-2 by mouth qid as needed diarrhea 10)  Skelaxin 800 Mg Tabs (Metaxalone) .Marland Kitchen.. 1 by mouth two or four times a day as needed back spasm 11)  Sumatriptan Succinate 100 Mg Tabs (Sumatriptan succinate) .Marland Kitchen.. 1 by mouth once daily as needed for migraine 12)  Cymbalta 60 Mg Cpep (Duloxetine hcl) .Marland Kitchen.. 1 by mouth once daily for depression  Patient Instructions: 1)  Please schedule a follow-up appointment in 3 months. Prescriptions: ATIVAN 1 MG  TABS (LORAZEPAM) 1 at bedtime prn  #90 x 2   Entered and Authorized by:   Tresa Garter MD   Signed by:   Tresa Garter MD on 12/01/2009   Method used:   Print then Give to Patient   RxID:   [id number] CYMBALTA 60 MG CPEP (DULOXETINE HCL) 1 by mouth once daily for depression  #30 x 6   Entered and Authorized by:   Tresa Garter MD   Signed by:   Tresa Garter MD on 12/01/2009   Method used:   Electronically to        [facility]* (retail)       [address]       [place], [place]  [address]       Ph: [phone]       Fax: [phone]   RxID:   [id number] TOPROL XL 25 MG   TB24 (METOPROLOL SUCCINATE) 1/2 by mouth bid  #30 x 12   Entered and Authorized by:   Tresa Garter MD   Signed by:   Tresa Garter MD on 12/01/2009   Method used:   Electronically to        [facility] (retail)       [address]       [place], [place]  [address]       Ph: [phone]       Fax: [phone]   RxID:   [id number] CYMBALTA 60 MG CPEP (DULOXETINE HCL) 1 by mouth once daily for depression  #30 x 6   Entered and Authorized by:   Tresa Garter MD   Signed by:  Tresa Garter MD on 12/01/2009   Method used:   Electronically to        [facility]* (retail)       [address]       [place], [place]  [address]       Ph: [phone]       Fax: [phone]   RxID:   [id number]

## 2010-10-10 NOTE — Assessment & Plan Note (Signed)
Summary: 2 mos f/u  $50 cd   Vital Signs:  Patient Profile:   76 Years Old Female Weight:      170 pounds Temp:     97 degrees F oral Pulse rate:   72 / minute BP sitting:   112 / 84  (left arm)  Vitals Entered By: Tora Perches (October 27, 2008 2:01 PM)                 Chief Complaint:  Multiple medical problems or concerns.  History of Present Illness: 1 wk ago came from [place] and passed out on the plane. Had dinner w/ 2 vodkas  and fell asleep, then got up to go to bathroom and passed out. No had injury, CP.    Prior Medications Reviewed Using: Patient Recall  Current Allergies: No known allergies   Past Medical History:    Reviewed history from 08/25/2008 and no changes required:       Colonic polyps, hx of       GERD       Hypertension       hx of brief SVT       symptomatic PAC's       Anxiety       Allergic rhinitis       IBS - constipation predominant Dr Kinnie Scales       Diverticulosis, colon       Gyn Dr Eda Paschal       L hip bursitis Dr Wyline Mood   Family History:    Reviewed history from 05/21/2008 and no changes required:       Family History Hypertension       Colon cancer - mother         Social History:    Reviewed history from 05/21/2008 and no changes required:       Married       Former Smoker       Regular exercise-yes       work - travel agent       2 children    Review of Systems       The patient complains of syncope.  The patient denies chest pain, dyspnea on exertion, peripheral edema, prolonged cough, abdominal pain, and melena.     Physical Exam  General:     Well Ears:     External ear exam shows no significant lesions or deformities.  Otoscopic examination reveals clear canals, tympanic membranes are intact bilaterally without bulging, retraction, inflammation or discharge. Hearing is grossly normal bilaterally. Nose:     External nasal examination shows no deformity or inflammation. Nasal mucosa are pink and moist  without lesions or exudates. Mouth:     Oral mucosa and oropharynx without lesions or exudates.  Teeth in good repair. Lungs:     Normal respiratory effort, chest expands symmetrically. Lungs are clear to auscultation, no crackles or wheezes. Heart:     Normal rate and regular rhythm. S1 and S2 normal without gallop, murmur, click, rub or other extra sounds. Abdomen:     Bowel sounds positive,abdomen soft and non-tender without masses, organomegaly or hernias noted. Msk:     No deformity or scoliosis noted of thoracic or lumbar spine.   Extremities:     No clubbing, cyanosis, edema, or deformity noted with normal full range of motion of all joints.   Neurologic:     No cranial nerve deficits noted. Station and gait are normal. Plantar reflexes are down-going bilaterally. DTRs  are symmetrical throughout. Sensory, motor and coordinative functions appear intact. Skin:     Intact without suspicious lesions or rashes Psych:     Cognition and judgment appear intact. Alert and cooperative with normal attention span and concentration. No apparent delusions, illusions, hallucinations    Impression & Recommendations:  Problem # 1:  HYPOTENSION (ICD-458.9) Assessment: Comment Only Cortr stim test  Problem # 2:  RENAL INSUFFICIENCY (ICD-588.9) Assessment: Comment Only Increase fluid intake BMEToi  Problem # 3:  SYNCOPE (ICD-780.2) - likely vaso -vagal  Complete Medication List: 1)  Super Probiotic Caps (Misc intestinal flora regulat) .... Once daily 2)  Nexium 40 Mg Cpdr (Esomeprazole magnesium) .... Once daily 3)  Estradiol 1 Mg Tabs (Estradiol) .... Once daily 4)  Toprol Xl 25 Mg Tb24 (Metoprolol succinate) .... 1/2 by mouth bid 5)  Multivitamins Tabs (Multiple vitamin) .... Once daily 6)  Ativan 1 Mg Tabs (Lorazepam) .... At bedtime 7)  Zyrtec Allergy 10 Mg Tabs (Cetirizine hcl) .... Otc as needed 8)  Vitamin D3 1000 Unit Tabs (Cholecalciferol) .Marland Kitchen.. 1 by mouth daily 9)  Levsin 0.125  Mg Tabs (Hyoscyamine sulfate) .Marland Kitchen.. 1-2 at hs as needed 10)  Voltaren 1 % Gel (Diclofenac sodium) .... Two times a day  to qid as needed   Patient Instructions: 1)  Cortrosyn stimulation test at our lab this week and a BMET  782.0 2)  Call if you are not better in a reasonable ammount of time or if worse. Go to ER if feeling really bad! 3)  Please schedule a follow-up appointment in 1 month.

## 2010-10-10 NOTE — Progress Notes (Signed)
Summary: RF Lorazepam  Phone Note Refill Request Message from:  Fax from Pharmacy  Refills Requested: Medication #1:  ATIVAN 1 MG  TABS 1 at bedtime prn   Last Refilled: 02/10/2010 per pharm pt is going out of town for 1 month. She is now taking 1 1/2 tab at bedtime if 1 doesnt work.  Ok to change sig/quantity and RF early?   Method Requested: Telephone to Pharmacy Next Appointment Scheduled: None Initial call taken by: Lanier Prude, [facility]),  March 30, 2010 2:24 PM  Follow-up for Phone Call        ok 3 ref Follow-up by: Tresa Garter MD,  March 31, 2010 1:30 PM  Additional Follow-up for Phone Call Additional follow up Details #1::        Rx called to pharmacy Additional Follow-up by: Lanier Prude, [facility]),  March 31, 2010 2:31 PM    Prescriptions: ATIVAN 1 MG  TABS (LORAZEPAM) 1 at bedtime prn  #90 x 3   Entered by:   Lanier Prude, [facility])   Authorized by:   Tresa Garter MD   Signed by:   Lanier Prude, CMA(AAMA) on 03/31/2010   Method used:   Telephoned to ...       [facility]* (retail)       [address]       [place], [place]  [address]       Ph: [phone]       Fax: [phone]   RxID:   [id number]

## 2010-10-10 NOTE — Progress Notes (Signed)
Summary: Groin Pain   Phone Note Call from Patient Call back at Home Phone [phone]   Summary of Call: Pt says that she is having severe groin pain, req apt to see Dr Posey Rea. Pt declined multiple apt's with other dr's, says she only wants to see Dr Posey Rea and left a mess with him on Dr's vm.  Initial call taken by: Lamar Sprinkles,  December 08, 2007 10:02 AM  Follow-up for Phone Call        OV today Follow-up by: Tresa Garter MD,  December 08, 2007 1:17 PM  Additional Follow-up for Phone Call Additional follow up Details #1::        Left mess for pt to call office back for apt ..................................................................Marland KitchenLamar Sprinkles  December 08, 2007 2:28 PM   Pt already has apt  Additional Follow-up by: Lamar Sprinkles,  December 08, 2007 2:28 PM

## 2010-10-10 NOTE — Assessment & Plan Note (Signed)
Summary: MED CHECK--PER PT   STC   Vital Signs:  Patient profile:   76 year old female Weight:      169 pounds Temp:     97.6 degrees F oral Pulse rate:   79 / minute BP sitting:   144 / 100  (left arm)  Vitals Entered By: Tora Perches (June 14, 2009 4:34 PM) CC: f/u on meds Is Patient Diabetic? No   CC:  f/u on meds.  History of Present Illness: C/o L hip and thigh pain 3-4 at rest, after activity or with position change - bad! A little better w/rest.  Current Medications (verified): 1)  Super Probiotic   Caps (Misc Intestinal Flora Regulat) .... Once Daily 2)  Nexium 40 Mg  Cpdr (Esomeprazole Magnesium) .... Once Daily 3)  Estradiol 1 Mg  Tabs (Estradiol) .... Once Daily 4)  Toprol Xl 25 Mg  Tb24 (Metoprolol Succinate) .... 1/2 By Mouth Bid 5)  Multivitamins   Tabs (Multiple Vitamin) .... Once Daily 6)  Ativan 1 Mg  Tabs (Lorazepam) .Marland Kitchen.. 1 At Bedtime Prn 7)  Zyrtec Allergy 10 Mg  Tabs (Cetirizine Hcl) .... Otc As Needed 8)  Vitamin D3 1000 Unit  Tabs (Cholecalciferol) .Marland Kitchen.. 1 By Mouth Daily 9)  Levsin 0.125 Mg Tabs (Hyoscyamine Sulfate) .Marland Kitchen.. 1-2 At [facility] As Needed 10)  Voltaren 1 %  Gel (Diclofenac Sodium) .... Two Times A Day  To Qid As Needed 11)  Lidoderm 5 % Ptch (Lidocaine) .Marland Kitchen.. 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  The patient denies fever, chest pain, abdominal pain, and depression.    Physical Exam  General:  Well Nose:  External nasal examination shows no deformity or inflammation. Nasal mucosa are pink and moist without lesions or exudates. Mouth:  Oral mucosa and oropharynx without lesions or exudates.  Teeth in good repair. Lungs:  Normal respiratory effort, chest expands symmetrically. Lungs are clear to auscultation, no crackles or wheezes. Heart:  Normal rate and regular rhythm. S1 and S2 normal without gallop, murmur, click, rub or other extra sounds.   Impression & Recommendations:  Problem # 1:  HIP PAIN (ICD-719.45) Assessment Unchanged See "Patient Instructions".  Her updated medication list for this problem includes:    Tramadol Hcl 50 Mg Tabs (Tramadol hcl) .Marland Kitchen... 1-2 by mouth qid as needed pain The office visit took longer than 20 min with patient councelling for more than 50% of the 20 min   Orders: TLB-BMP (Basic Metabolic Panel-BMET) (80048-METABOL) TLB-CBC Platelet - w/Differential (85025-CBCD) TLB-Sedimentation Rate (ESR) (85652-ESR) TLB-Uric Acid, Blood (84550-URIC)  Problem # 2:  RENAL INSUFFICIENCY (ICD-588.9) Assessment: Comment Only Monitoring  Problem # 3:  HYPOTENSION (ICD-458.9) Assessment: Improved  Complete Medication List: 1)  Super Probiotic Caps (Misc intestinal flora regulat) .... Once daily 2)  Nexium 40 Mg Cpdr (Esomeprazole magnesium) .... Once daily 3)  Estradiol 1 Mg Tabs (Estradiol) .... Once daily 4)  Toprol Xl 25 Mg Tb24 (Metoprolol succinate) .... 1/2 by mouth bid 5)  Multivitamins Tabs (Multiple vitamin) .... Once daily 6)  Ativan 1 Mg Tabs (Lorazepam) .Marland Kitchen.. 1 at bedtime prn 7)  Zyrtec Allergy 10 Mg Tabs (Cetirizine hcl) .... Otc as needed 8)  Vitamin D3 1000 Unit Tabs (Cholecalciferol) .Marland Kitchen.. 1 by mouth daily 9)  Levsin 0.125 Mg Tabs (Hyoscyamine sulfate) .Marland Kitchen.. 1-2 at hs as needed 10)  Lidoderm 5 % Ptch (Lidocaine) .Marland Kitchen.. 1-3 once  daily as needed pain 11)  Pennsaid 1.5 % Soln (Diclofenac sodium) .... 3-5 gtt to skin three times daily 12)  Tramadol Hcl 50 Mg Tabs (Tramadol hcl) .Marland Kitchen.. 1-2 by mouth qid as needed pain  Patient Instructions: 1)  Start taking a chair yoga class 2)  Please schedule a follow-up appointment in 2 months. Prescriptions: TRAMADOL HCL 50 MG  TABS (TRAMADOL HCL) 1-2 by mouth qid as needed pain  #60 x 3   Entered and Authorized by:   Tresa Garter MD   Signed by:   Tresa Garter MD on 06/14/2009   Method used:   Print then Give to Patient   RxID:   [id number] PENNSAID 1.5 % SOLN (DICLOFENAC SODIUM) 3-5 gtt to skin three times daily  #1 x 3   Entered and Authorized by:   Tresa Garter MD   Signed by:   Tresa Garter MD on 06/14/2009   Method used:   Print then Give to Patient   RxID:   [id number]

## 2010-10-10 NOTE — Miscellaneous (Signed)
Summary: lorazepam   Clinical Lists Changes  Medications: Rx of ATIVAN 1 MG  TABS (LORAZEPAM) 1 at bedtime prn;  #30 x 5;  Signed;  Entered by: Tora Perches;  Authorized by: Tresa Garter MD;  Method used: Telephoned to [facility]*, [address], [place], [place]  [address], Ph: [phone], Fax: [phone]    Prescriptions: ATIVAN 1 MG  TABS (LORAZEPAM) 1 at bedtime prn  #30 x 5   Entered by:   Tora Perches   Authorized by:   Tresa Garter MD   Signed by:   Tora Perches on 06/03/2009   Method used:   Telephoned to ...       [facility]* (retail)       [address]       [place], [place]  [address]       Ph: [phone]       Fax: [phone]   RxID:   [id number]  will fax to pharmacy/vg

## 2010-10-10 NOTE — Consult Note (Signed)
Summary: [facility]  [facility]   Imported By: Maryln Gottron 02/01/2009 09:30:45  _____________________________________________________________________  External Attachment:    Type:   Image     Comment:   External Document

## 2010-10-10 NOTE — Progress Notes (Signed)
Summary: toprol  Phone Note From Pharmacy Call back at fax   Caller: [facility]* Details for Reason: refill request on toprol Details of Action Taken: ok  times 9 refills Initial call taken by: Tora Perches,  August 11, 2008 8:31 AM      Prescriptions: TOPROL XL 25 MG  TB24 (METOPROLOL SUCCINATE) 1/2 by mouth bid  #30 x 9   Entered by:   Tora Perches   Authorized by:   Tresa Garter MD   Signed by:   Tora Perches on 08/11/2008   Method used:   Electronically to        [facility]* (retail)       [address]       [place], [place]  [address]       Ph: [phone]       Fax: [phone]   RxID:   [id number]

## 2010-10-10 NOTE — Assessment & Plan Note (Signed)
Summary: f/u,mc   Vital Signs:  Patient profile:   76 year old female BP sitting:   138 / 91  Vitals Entered By: Lillia Pauls CMA (July 20, 2010 4:13 PM)  History of Present Illness: Hailey [name] returns w pain insame area radiating down ant left leg After last  visit she was getting some improvement went to [place] for a trip did lots of walking returned with more hip pain very severe this AM and worked in for eval  Her past hx is greater troch bursectomy on left Pain after that shifted more to ant hip area  last visit found to have injury at sartorius   Allergies: No Known Drug Allergies  Physical Exam  General:  Well-developed,well-nourished,in no acute distress; alert,appropriate and cooperative throughout examination Msk:  Pain over ASIS and down to ISIS no swelling good hip abduction and flex strength weak sartorius strength on testing  norm rotational ROM of hip  MSK [place] there is fluid collection that dissects along sartorius on left no bursal type swelling noted as before this actually seems to track down tendon surface   Impression & Recommendations:  Problem # 1:  GROIN PAIN (ICD-789.09)  The following medications were removed from the medication list:    Skelaxin 800 Mg Tabs (Metaxalone) .Marland Kitchen... 1 by mouth two or four times a day as needed back spasm Her updated medication list for this problem includes:    Tramadol Hcl 50 Mg Tabs (Tramadol hcl) ..... One tab by mouth q6h as needed for pain  not using but 1 tramadol daily and usually cuts in half  encourage to use more regualrly to control pain  reck here p 4 to 6 PT sessions  Orders: [place] LIMITED ([id number])  Problem # 2:  BURSITIS, LEFT HIP (ICD-726.5)  this swelling is reduced post injection   new scan fidnings more suggest tendinopathy no tear detected  will give trial of PT  Orders: [place] LIMITED ([id number])  Complete Medication List: 1)  Super Probiotic Caps (Misc intestinal flora regulat) ....  Once daily 2)  Nexium 40 Mg Cpdr (Esomeprazole magnesium) .... Once daily 3)  Estradiol 1 Mg Tabs (Estradiol) .... Once daily 4)  Toprol Xl 25 Mg Tb24 (Metoprolol succinate) .... 1/2 by mouth bid 5)  Multivitamins Tabs (Multiple vitamin) .... Once daily 6)  Ativan 1 Mg Tabs (Lorazepam) .Marland Kitchen.. 1 at bedtime prn 7)  Zyrtec Allergy 10 Mg Tabs (Cetirizine hcl) .... Otc as needed 8)  Sumatriptan Succinate 100 Mg Tabs (Sumatriptan succinate) .Marland Kitchen.. 1 by mouth once daily as needed for migraine 9)  Cymbalta 30 Mg Cpep (Duloxetine hcl) .Marland Kitchen.. 1 by mouth once daily at hs 10)  Tramadol Hcl 50 Mg Tabs (Tramadol hcl) .... One tab by mouth q6h as needed for pain  Patient Instructions: 1)  wean cymbalta 2)  60/30 alternate for 10 days 3)  then 30 daily for 10 days 4)  30 x 2 days and skip 1 day for a total of 12 days 5)  then 30 every other day for 12 days and then stop 6)  go to PT twice weekly 7)  work on hip strength - particularly sartorius Muscle on left 8)  use 1/2 tramadol tablet every 4 hours if tolerated and not fuzzy  9)  reck with me in 4 to 6 weeks after at least several PT sessions Prescriptions: TRAMADOL HCL 50 MG TABS (TRAMADOL HCL) One tab by mouth q6h as needed for pain  #100 x 3   Entered  and Authorized by:   Enid Baas MD   Signed by:   Enid Baas MD on 07/20/2010   Method used:   Electronically to        [facility]* (retail)       [address]       [place], [place]  [address]       Ph: [phone]       Fax: [phone]   RxID:   [id number]    Orders Added: 1)  Est. Patient Level IV [40347] 2)  [place] LIMITED [[id number]]

## 2010-10-10 NOTE — Miscellaneous (Signed)
Summary: lorazepam   Clinical Lists Changes  Medications: Rx of ATIVAN 1 MG  TABS (LORAZEPAM) at bedtime;  #90 x 6;  Signed;  Entered by: Tora Perches;  Authorized by: Tresa Garter MD;  Method used: Telephoned to [facility]*, [address] [place], [place], [place]  [address], Ph: [phone] or [phone], Fax: [phone]    Prescriptions: ATIVAN 1 MG  TABS (LORAZEPAM) at bedtime  #90 x 6   Entered by:   Tora Perches   Authorized by:   Tresa Garter MD   Signed by:   Tora Perches on 08/20/2007   Method used:   Telephoned to ...       [facility]*       [address]       [place], [place]  [address]       Ph: [phone] or [phone]       Fax: [phone]   RxID:   [id number]

## 2010-10-10 NOTE — Miscellaneous (Signed)
Summary: cortrosyn inject.   Clinical Lists Changes  Orders: Added new Service order of Cosyntropin Inj. (Z6109) - Signed Added new Service order of Admin of Therapeutic Inj  intramuscular or subcutaneous (60454) - Signed      Medication Administration  Injection # 1:    Medication: Cosyntropin Inj.    Diagnosis: HYPOTENSION (ICD-458.9)    Route: IM    Site: L deltoid    Exp Date: 11/2009    Lot #: [id number]    Mfr: amphastar    Patient tolerated injection without complications    Given by: Tora Perches (October 28, 2008 9:45 AM)  Orders Added: 1)  Cosyntropin Inj. [J0835] 2)  Admin of Therapeutic Inj  intramuscular or subcutaneous [[id number]]

## 2010-10-10 NOTE — Progress Notes (Signed)
Summary: RELPAX  Phone Note Call from Patient Call back at Home Phone [phone] Call back at [facility] VM ON HM #   Summary of Call: Patient is requesting more samples of relpax. Per pt, med needs PA. Is there an alternative that you would like to try? Has pt tried generics?  Initial call taken by: Lamar Sprinkles, CMA,  November 07, 2009 5:40 PM  Follow-up for Phone Call        ok samples if we have We can trya generic  Imitrex if needed Follow-up by: Tresa Garter MD,  November 08, 2009 1:22 PM  Additional Follow-up for Phone Call Additional follow up Details #1::        4 samples left upfront. PA needs to be started. .............Marland KitchenLamar Sprinkles, CMA  November 08, 2009 5:51 PM     Additional Follow-up for Phone Call Additional follow up Details #2::    Left message on voicemail at pharmacy for rejected claim to be faxed so I call for PA.Lucious Groves,  November 09, 2009 9:20 AM  Called for PA and they will fax forms. Lucious Groves  November 10, 2009 3:16 PM   Form was never rec'd, requested again. Lucious Groves  November 11, 2009 8:58 AM   Form completed and faxed, will wait for insurance company reply. Lucious Groves  November 11, 2009 9:45 AM

## 2010-10-10 NOTE — Progress Notes (Signed)
Summary: LORAZEPAM  Phone Note Other Incoming Call back at [facility] [employer]   Caller: [facility] Summary of Call: REFILL ON LORAZEPAM-------- OK X 2 REFILLS/VG Initial call taken by: Tora Perches,  November 25, 2009 5:25 PM    Prescriptions: ATIVAN 1 MG  TABS (LORAZEPAM) 1 at bedtime prn  #90 x 2   Entered by:   Tora Perches   Authorized by:   Tresa Garter MD   Signed by:   Tora Perches on 11/25/2009   Method used:   Telephoned to ...       [facility]* (retail)       [address]       [place], [place]  [id number]       Ph: [phone]       Fax: [phone]   RxID:   [id number]

## 2010-10-10 NOTE — Letter (Signed)
Summary: [facility]  [facility]   Imported By: Charlette Caffey 02/23/2009 16:54:48  _____________________________________________________________________  External Attachment:    Type:   Image     Comment:   External Document

## 2010-10-10 NOTE — Consult Note (Signed)
Summary: Neurosurgical/[facility]  Neurosurgical/[facility]   Imported By: Sherian Rein 11/24/2008 11:48:49  _____________________________________________________________________  External Attachment:    Type:   Image     Comment:   External Document

## 2010-10-10 NOTE — Miscellaneous (Signed)
   Clinical Lists Changes  Observations: Added new observation of FLU VAX: Historical (06/05/2010 11:31)      Immunization History:  Influenza Immunization History:    Influenza:  historical (06/05/2010)

## 2010-10-10 NOTE — Letter (Signed)
Summary: Follow up Visit / [facility]  Follow up Visit / [facility]   Imported By: Lennie Odor 09/12/2009 14:28:41  _____________________________________________________________________  External Attachment:    Type:   Image     Comment:   External Document

## 2010-10-10 NOTE — Progress Notes (Signed)
Summary: RELPAX   Phone Note Call from Patient   Summary of Call: Pt called regarding PA for relpax, wants to try alternative if possible.  Initial call taken by: Lamar Sprinkles, CMA,  November 11, 2009 4:15 PM  Follow-up for Phone Call        Imitrex 100 mg 1 by mouth once daily #12 3 ref Follow-up by: Tresa Garter MD,  November 11, 2009 5:16 PM  Additional Follow-up for Phone Call Additional follow up Details #1::        Pt informed  Additional Follow-up by: Lamar Sprinkles, CMA,  November 11, 2009 5:30 PM    New/Updated Medications: SUMATRIPTAN SUCCINATE 100 MG TABS (SUMATRIPTAN SUCCINATE) 1 by mouth once daily as needed for migraine Prescriptions: SUMATRIPTAN SUCCINATE 100 MG TABS (SUMATRIPTAN SUCCINATE) 1 by mouth once daily as needed for migraine  #12 x 3   Entered and Authorized by:   Tresa Garter MD   Signed by:   Lamar Sprinkles, CMA on 11/11/2009   Method used:   Electronically to        [facility]* (retail)       [address]       [place], [place]  [id number]       Ph: [phone]       Fax: [phone]   RxID:   [id number]

## 2010-10-10 NOTE — Progress Notes (Signed)
Summary: Relpax denied  Phone Note Other Incoming   Summary of Call: Patient Relpax has been denied. Ins states that patient must try all covered meds, Imitrex, Maxalt, and Amerge. Please advise. Initial call taken by: Lucious Groves,  November 14, 2009 2:32 PM  Follow-up for Phone Call        OK Sumatr (Imitrex) - see meds Follow-up by: Tresa Garter MD,  November 14, 2009 11:53 PM  Additional Follow-up for Phone Call Additional follow up Details #1::        Left message with spouse for patient to call back to office. Additional Follow-up by: Lucious Groves,  November 15, 2009 11:27 AM    Additional Follow-up for Phone Call Additional follow up Details #2::    left message on machine to call back to office. Follow-up by: Lucious Groves,  November 17, 2009 11:54 AM  Additional Follow-up for Phone Call Additional follow up Details #3:: Details for Additional Follow-up Action Taken: Patient already aware. Additional Follow-up by: Lucious Groves,  November 17, 2009 2:37 PM

## 2010-10-10 NOTE — Progress Notes (Signed)
Summary: CYMBALTA  Phone Note From Pharmacy   Caller: [facility]* Summary of Call: Pharm called. Rx and samples were given to pt for Cymbalta 60 at last office visit. Pt was previously on 30mg . Pt told pharm that increase in dose was not discussed at office visit. Please advise.  Initial call taken by: Lamar Sprinkles, CMA,  December 02, 2009 3:10 PM  Follow-up for Phone Call        Sorry... 60 mg is the most common maint dose - try it. If problems - go back to 30 mg/d Follow-up by: Tresa Garter MD,  December 02, 2009 5:15 PM  Additional Follow-up for Phone Call Additional follow up Details #1::        Pt informed  Additional Follow-up by: Lamar Sprinkles, CMA,  December 02, 2009 5:34 PM

## 2010-10-10 NOTE — Assessment & Plan Note (Signed)
Summary: YEARLY F/U MEDICARE/$50/JK   Vital Signs:  Patient Profile:   76 Years Old Female Temp:     97.1 degrees F oral Pulse rate:   64 / minute BP sitting:   104 / 72  (left arm)  Vitals Entered By: Tora Perches (May 26, 2008 10:46 AM)                 Chief Complaint:  Preventive Care  ( refused weight).  History of Present Illness: The patient presents for a wellness examination Had a problem with low BP. Stopped Norvasc on 9/10    Current Allergies: No known allergies   Past Medical History:    Reviewed history from 05/21/2008 and no changes required:       Colonic polyps, hx of       GERD       Hypertension       hx of brief SVT       symptomatic PAC's       Anxiety       Allergic rhinitis       IBS - constipation predominant       Diverticulosis, colon       Gyn Dr Eda Paschal  Past Surgical History:    Reviewed history from 05/21/2008 and no changes required:       Hysterectomy       Bladder surgury       s/p breast reduction surgury       Appendectomy   Family History:    Reviewed history from 05/21/2008 and no changes required:       Family History Hypertension       Colon cancer - mother         Social History:    Reviewed history from 05/21/2008 and no changes required:       Married       Former Smoker       Regular exercise-yes       work - travel agent       2 children    Review of Systems  The patient denies anorexia, fever, weight loss, weight gain, vision loss, decreased hearing, hoarseness, chest pain, syncope, dyspnea on exertion, peripheral edema, prolonged cough, headaches, hemoptysis, abdominal pain, melena, hematochezia, severe indigestion/heartburn, hematuria, incontinence, genital sores, muscle weakness, suspicious skin lesions, transient blindness, difficulty walking, depression, unusual weight change, abnormal bleeding, enlarged lymph nodes, angioedema, and breast masses.     Physical Exam  General:  Well-developed,well-nourished,in no acute distress; alert,appropriate and cooperative throughout examination Eyes:     No corneal or conjunctival inflammation noted. EOMI. Perrla. Funduscopic exam benign, without hemorrhages, exudates or papilledema. Vision grossly normal. Ears:     External ear exam shows no significant lesions or deformities.  Otoscopic examination reveals clear canals, tympanic membranes are intact bilaterally without bulging, retraction, inflammation or discharge. Hearing is grossly normal bilaterally. Nose:     External nasal examination shows no deformity or inflammation. Nasal mucosa are pink and moist without lesions or exudates. Mouth:     Oral mucosa and oropharynx without lesions or exudates.  Teeth in good repair. Neck:     No deformities, masses, or tenderness noted. Lungs:     Normal respiratory effort, chest expands symmetrically. Lungs are clear to auscultation, no crackles or wheezes. Heart:     Normal rate and regular rhythm. S1 and S2 normal without gallop, murmur, click, rub or other extra sounds. Abdomen:     Bowel sounds positive,abdomen soft and  non-tender without masses, organomegaly or hernias noted. Msk:     No deformity or scoliosis noted of thoracic or lumbar spine.   Extremities:     No clubbing, cyanosis, edema, or deformity noted with normal full range of motion of all joints.   Neurologic:     No cranial nerve deficits noted. Station and gait are normal. Plantar reflexes are down-going bilaterally. DTRs are symmetrical throughout. Sensory, motor and coordinative functions appear intact. Skin:     Intact without suspicious lesions or rashes Psych:     Cognition and judgment appear intact. Alert and cooperative with normal attention span and concentration. No apparent delusions, illusions, hallucinations    Impression & Recommendations:  Problem # 1:  WELL ADULT EXAM (ICD-V70.0) Assessment: Comment Only The labs were reviewd with the  patient. EKG nl GYN/BDS P Orders: EKG w/ Interpretation (93000)   Problem # 2:  HYPOTENSION (ICD-458.9) Assessment: New Norvasc stopped Toprol 1/2 two times a day if tol.  Problem # 3:  ANXIETY (ICD-300.00) Assessment: Unchanged  Her updated medication list for this problem includes:    Ativan 1 Mg Tabs (Lorazepam) .Marland Kitchen... At bedtime   Problem # 4:  ALLERGIC RHINITIS (ICD-477.9) Assessment: Comment Only  Her updated medication list for this problem includes:    Zyrtec Allergy 10 Mg Tabs (Cetirizine hcl) ..... Otc as needed   Problem # 5:  GERD (ICD-530.81) Assessment: Unchanged  Her updated medication list for this problem includes:    Nexium 40 Mg Cpdr (Esomeprazole magnesium) ..... Once daily   Problem # 6:  COLONIC POLYPS, HX OF (ICD-V12.72) Assessment: Comment Only  Complete Medication List: 1)  Super Probiotic Caps (Misc intestinal flora regulat) .... Once daily 2)  Nexium 40 Mg Cpdr (Esomeprazole magnesium) .... Once daily 3)  Estradiol 1 Mg Tabs (Estradiol) .... Once daily 4)  Toprol Xl 25 Mg Tb24 (Metoprolol succinate) .... 1/2 by mouth bid 5)  Multivitamins Tabs (Multiple vitamin) .... Once daily 6)  Magnesia 311 Mg Chew (Magnesium hydroxide) .... Once daily 7)  Ativan 1 Mg Tabs (Lorazepam) .... At bedtime 8)  Zyrtec Allergy 10 Mg Tabs (Cetirizine hcl) .... Otc as needed 9)  Vitamin D3 1000 Unit Tabs (Cholecalciferol) .Marland Kitchen.. 1 by mouth daily  Other Orders: Influenza Vaccine MCR (98119)   Patient Instructions: 1)  Please schedule a follow-up appointment in 3 months. 2)  BMP prior to visit, ICD-9:401.1   ]  Influenza Vaccine    Vaccine Type: Fluvax MCR    Site: left deltoid    Mfr: GlaxoSmithKline    Dose: 0.5 ml    Route: IM    Given by: Tora Perches    Exp. Date: 03/09/2009    Lot #: [id number]    VIS given: 04/03/07 version given May 26, 2008.  Flu Vaccine Consent Questions    Do you have a history of severe allergic reactions to this  vaccine? no    Any prior history of allergic reactions to egg and/or gelatin? no    Do you have a sensitivity to the preservative Thimersol? no    Do you have a past history of Guillan-Barre Syndrome? no    Do you currently have an acute febrile illness? no    Have you ever had a severe reaction to latex? no    Vaccine information given and explained to patient? yes    Are you currently pregnant? no

## 2010-10-10 NOTE — Assessment & Plan Note (Signed)
Summary: SICK/NWS   Vital Signs:  Patient profile:   76 year old female Weight:      171 pounds BMI:     29.46 Temp:     97.6 degrees F oral Pulse rate:   60 / minute BP sitting:   128 / 84  (left arm)  Vitals Entered By: Tora Perches (October 31, 2009 4:18 PM) CC: sick Is Patient Diabetic? No   CC:  sick.  History of Present Illness: C/o L HA x 4 d heavy and constant 4-5 d severe C/o family stress lately C/o severe omgoing buttock pain  Current Medications (verified): 1)  Super Probiotic   Caps (Misc Intestinal Flora Regulat) .... Once Daily 2)  Nexium 40 Mg  Cpdr (Esomeprazole Magnesium) .... Once Daily 3)  Estradiol 1 Mg  Tabs (Estradiol) .... Once Daily 4)  Toprol Xl 25 Mg  Tb24 (Metoprolol Succinate) .... 1/2 By Mouth Bid 5)  Multivitamins   Tabs (Multiple Vitamin) .... Once Daily 6)  Ativan 1 Mg  Tabs (Lorazepam) .Marland Kitchen.. 1 At Bedtime Prn 7)  Zyrtec Allergy 10 Mg  Tabs (Cetirizine Hcl) .... Otc As Needed 8)  Pennsaid 1.5 % Soln (Diclofenac Sodium) .... 3-5 Gtt To Skin Three Times Daily 9)  Tramadol Hcl 50 Mg  Tabs (Tramadol Hcl) .Marland Kitchen.. 1-2 By Mouth Qid As Needed Pain 10)  Lomotil 2.5-0.025 Mg Tabs (Diphenoxylate-Atropine) .Marland Kitchen.. 1-2 By Mouth Qid As Needed Diarrhea 11)  Skelaxin 800 Mg Tabs (Metaxalone) .Marland Kitchen.. 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 EOMI. Perrla.  Ears:  External ear exam shows no significant lesions or deformities.  Otoscopic examination reveals clear canals, tympanic membranes are intact bilaterally without bulging, retraction, inflammation or discharge. Hearing is grossly normal bilaterally. Nose:  External nasal examination shows no deformity or inflammation. Nasal mucosa are pink and moist without lesions or exudates. Mouth:  Oral mucosa and oropharynx without lesions or exudates.  Teeth in good repair. Lungs:  Normal respiratory effort, chest expands symmetrically. Lungs are clear to auscultation, no crackles or wheezes. Heart:  Normal rate and regular rhythm. S1 and S2 normal without gallop, murmur, click, rub or other extra sounds. Abdomen:  soft and non-tender.   Msk:  L iliac crest is tender to palp. antero-lat posteriorly, no mass Extremities:  No edema B Neurologic:  No cranial nerve deficits noted. Station and gait are normal. Plantar reflexes are down-going bilaterally. DTRs are symmetrical throughout. Sensory, motor and coordinative functions appear intact. Skin:  Intact without suspicious lesions or rashes Psych:  Oriented X3, not anxious appearing, not depressed appearing, and tearful.     Impression & Recommendations:  Problem # 1:  HEADACHE (ICD-784.0) L ? etiol Assessment New  The following medications were removed from the medication list:    Tramadol Hcl 50 Mg Tabs (Tramadol hcl) .Marland Kitchen... 1-2 by mouth qid as needed pain Her updated medication list for this  problem includes:    Toprol Xl 25 Mg Tb24 (Metoprolol succinate) .Marland Kitchen... 1/2 by mouth bid    Relpax 40 Mg Tabs (Eletriptan hydrobromide) .Marland Kitchen... 1 by mouth once daily as needed migraine    Nucynta 100 Mg Tabs (Tapentadol hcl) .Marland Kitchen... 1 by mouth two times a day as needed pain  Problem # 2:  L LBP due to raaptured glut Assessment: Unchanged See "Patient Instructions". Cymbalta option. Tramadol.  Problem # 3:  ANXIETY  (ICD-300.00) Assessment: Deteriorated  Her updated medication list for this problem includes:    Ativan 1 Mg Tabs (Lorazepam) .Marland Kitchen... 1 at bedtime prn  Complete Medication List: 1)  Super Probiotic Caps (Misc intestinal flora regulat) .... Once daily 2)  Nexium 40 Mg Cpdr (Esomeprazole magnesium) .... Once daily 3)  Estradiol 1 Mg Tabs (Estradiol) .... Once daily 4)  Toprol Xl 25 Mg Tb24 (Metoprolol succinate) .... 1/2 by mouth bid 5)  Multivitamins Tabs (Multiple vitamin) .... Once daily 6)  Ativan 1 Mg Tabs (Lorazepam) .Marland Kitchen.. 1 at bedtime prn 7)  Zyrtec Allergy 10 Mg Tabs (Cetirizine hcl) .... Otc as needed 8)  Pennsaid 1.5 % Soln (Diclofenac sodium) .... 3-5 gtt to skin three times daily 9)  Lomotil 2.5-0.025 Mg Tabs (Diphenoxylate-atropine) .Marland Kitchen.. 1-2 by mouth qid as needed diarrhea 10)  Skelaxin 800 Mg Tabs (Metaxalone) .Marland Kitchen.. 1 by mouth two or four times a day as needed back spasm 11)  Relpax 40 Mg Tabs (Eletriptan hydrobromide) .Marland Kitchen.. 1 by mouth once daily as needed migraine 12)  Nucynta 100 Mg Tabs (Tapentadol hcl) .Marland Kitchen.. 1 by mouth two times a day as needed pain  Patient Instructions: 1)  Call Dustin Folks 2)  Please schedule a follow-up appointment in 1 month. 3)  Try Cymbalta 30 mg a day 4)  Call if you are not better in a reasonable amount of time or if worse. Go to ER if feeling really bad!  Prescriptions: NUCYNTA 100 MG TABS (TAPENTADOL HCL) 1 by mouth two times a day as needed pain  #60 x 0   Entered and Authorized by:   Tresa Garter MD   Signed by:   Tresa Garter MD on 10/31/2009   Method used:   Print then Give to Patient   RxID:   [id number] RELPAX 40 MG TABS (ELETRIPTAN HYDROBROMIDE) 1 by mouth once daily as needed migraine  #6 x 6   Entered and Authorized by:   Tresa Garter MD   Signed by:   Tresa Garter MD on 10/31/2009   Method used:   Print then Give to Patient   RxID:   [id number]

## 2010-10-12 NOTE — Assessment & Plan Note (Signed)
Summary: F/U,[facility]   History of Present Illness: L trochanteric bursa surgery 2 years ago with good result.  Shortly thereafter noticed pain in anterior lateral hip that intermittently radiates down anterolateral leg.  Usually dull pain but sharp when turns over in bed and first thing in the morning.  Has used NTG patch for approximately 1 month with no improvement.  Occasionally improves with exercise (walking, pilates) but sometimes exercise exacerbates pain.  Continues to take Tramadol 1/2 tablet in am with some relief.  No sensation of weakness or numbness.  Allergies: No Known Drug Allergies  Physical Exam  General:  Well-developed,well-nourished,in no acute distress; alert,appropriate and cooperative throughout examination Head:  Normocephalic and atraumatic without obvious abnormalities.  Eyes:  PERRL Msk:  L hip- good ROM; pain elicited with sartorius stretch and deep palpation over the sartorius origin; good hip flexion, extension, adduction strength, improved hip abduction strength    Impression & Recommendations:  Problem # 1:  GROIN PAIN (ICD-789.09) Note - she has not been putting NTG over injured area but more to lat hip and side she did say for the first few days that the patch was closer to ASIS she had less pain not sure if she has had much of a trial with NTG so will restart this put today we marked the ASIS w markef pen so she will know where to put medication  Chronic pain in left groin, 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  Continue home exercises and pain relief with Tramadol and antiinflammatories. Continue nitroglycerin patches over sartorius origin.  Her updated medication list for this problem includes:    Tramadol Hcl 50 Mg Tabs (Tramadol hcl) ..... One tab by mouth q6h as needed for pain    Ibuprofen 600 Mg Tabs (Ibuprofen) .Marland Kitchen... 1 by mouth bid  pc x 1 wk then as needed for  pain  Her updated medication list for this problem includes:    Tramadol Hcl 50  Mg Tabs (Tramadol hcl) ..... One tab by mouth q6h as needed for pain    Ibuprofen 600 Mg Tabs (Ibuprofen) .Marland Kitchen... 1 by mouth bid  pc x 1 wk then as needed for  pain  she is off to [place] for a month will reck 4 to 6 wks from now  Complete Medication List: 1)  Super Probiotic Caps (Misc intestinal flora regulat) .... Once daily 2)  Nexium 40 Mg Cpdr (Esomeprazole magnesium) .... Once daily 3)  Estradiol 1 Mg Tabs (Estradiol) .... Once daily 4)  Toprol Xl 25 Mg Tb24 (Metoprolol succinate) .... 1/2 by mouth bid 5)  Multivitamins Tabs (Multiple vitamin) .... Once daily 6)  Ativan 1 Mg Tabs (Lorazepam) .Marland Kitchen.. 1 at bedtime prn 7)  Zyrtec Allergy 10 Mg Tabs (Cetirizine hcl) .... Otc as needed 8)  Sumatriptan Succinate 100 Mg Tabs (Sumatriptan succinate) .Marland Kitchen.. 1 by mouth once daily as needed for migraine 9)  Tramadol Hcl 50 Mg Tabs (Tramadol hcl) .... One tab by mouth q6h as needed for pain 10)  Cymbalta 60 Mg Cpep (Duloxetine hcl) .Marland Kitchen.. 1 by mouth once daily for depression 11)  Nitroglycerin 0.1 Mg/hr Pt24 (Nitroglycerin) .... Use 1/2 patch daily as directed 12)  Ibuprofen 600 Mg Tabs (Ibuprofen) .Marland Kitchen.. 1 by mouth bid  pc x 1 wk then as needed for  pain 13)  Accu-chek Aviva Strp (Glucose blood) .... Check qd 14)  Levaquin 500 Mg Tabs (Levofloxacin) .Marland Kitchen.. 1 by mouth qd 15)  Tessalon Perles 100 Mg Caps (Benzonatate) .Marland Kitchen.. 1-2 by mouth  two times a day as needed cogh 16)  Tussionex Pennkinetic Er 8-10 Mg/45ml Lqcr (Chlorpheniramine-hydrocodone) .... 5 ml by mouth two times a day as needed cough   Orders Added: 1)  Est. Patient Level III [66440]

## 2010-10-12 NOTE — Assessment & Plan Note (Signed)
Summary: 10:15 APPT,F/U POST PT,MC   Vital Signs:  Patient profile:   76 year old female BP sitting:   134 / 89  Vitals Entered By: Lillia Pauls CMA (August 31, 2010 11:09 AM)  History of Present Illness: Continues with left groin pain- states worse now than before PT.   Pain is on the same side that she has hx of left bursectomy.   This has not resolved w PT, with Injection directly into torn area of sarorius or with the surgery  The US examination did show a partial tear around sartorius tendon and deeper to gluteus medius  still hurts to do some exercises that move left hip more but able to walk  Allergies: No Known Drug Allergies  Physical Exam  General:  Well-developed,well-nourished,in no acute distress; alert,appropriate and cooperative throughout examination Msk:  Hip flexion good bilat Quad strength good bilat SI joints move freely Neg straight leg raise bilat hip rotation strength good on left Sartorius testing causes pain on left Left hip abduction weak- 3/5    Impression & Recommendations:  Problem # 1:  GROIN PAIN (ICD-789.09)  Her updated medication list for this problem includes:    Tramadol Hcl 50 Mg Tabs (Tramadol hcl) ..... One tab by mouth q6h as needed for pain   stay on half tab of tramadol as that does take edge off pain  d/w patient that nothing has really worked recommended trial of NTG patches for tendon healing - advised that there is data for tendons in shoulder, knee, elbow and AT but none on this area - risk is low and seems like best non invasive option  trial x 1 mo and reck  Problem # 2:  BURSITIS, LEFT HIP (ICD-726.5) no real pain over area of great trochanter now  may have different bursal areas that have swelling and will rescan pending TX response  Complete Medication List: 1)  Super Probiotic Caps (Misc intestinal flora regulat) .... Once daily 2)  Nexium 40 Mg Cpdr (Esomeprazole magnesium) .... Once daily 3)  Estradiol  1 Mg Tabs (Estradiol) .... Once daily 4)  Toprol Xl 25 Mg Tb24 (Metoprolol succinate) .... 1/2 by mouth bid 5)  Multivitamins Tabs (Multiple vitamin) .... Once daily 6)  Ativan 1 Mg Tabs (Lorazepam) .Marland Kitchen.. 1 at bedtime prn 7)  Zyrtec Allergy 10 Mg Tabs (Cetirizine hcl) .... Otc as needed 8)  Sumatriptan Succinate 100 Mg Tabs (Sumatriptan succinate) .Marland Kitchen.. 1 by mouth once daily as needed for migraine 9)  Tramadol Hcl 50 Mg Tabs (Tramadol hcl) .... One tab by mouth q6h as needed for pain 10)  Cymbalta 60 Mg Cpep (Duloxetine hcl) .Marland Kitchen.. 1 by mouth once daily for depression 11)  Nitroglycerin 0.1 Mg/hr Pt24 (Nitroglycerin) .... Use 1/2 patch daily as directed  Patient Instructions: 1)  Use 1/2 Nitroglycerin daily on painful area of hip- change patch everyday 2)  Continue doing hip strengthening exercises Prescriptions: NITROGLYCERIN 0.1 MG/HR PT24 (NITROGLYCERIN) use 1/2 patch daily as directed  #30 x 2   Entered and Authorized by:   Enid Baas MD   Signed by:   Enid Baas MD on 08/31/2010   Method used:   Electronically to        [facility]* (retail)       [address]       [place], [place]  [id number]       Ph: [phone]       Fax: [phone]   RxID:   [id number]  Orders Added: 1)  Est. Patient Level IV [id number]

## 2010-10-12 NOTE — Progress Notes (Signed)
Summary: CALL  Phone Note Call from Patient   Summary of Call: Patient is requesting a call back. C/o continued symptoms. left mess to call office back. Initial call taken by: Lamar Sprinkles, CMA,  September 25, 2010 4:56 PM  Follow-up for Phone Call        If she cont w/URI symptoms - call in a Z pac and Prednisone OV if worse Follow-up by: Tresa Garter MD,  September 26, 2010 8:33 AM  Additional Follow-up for Phone Call Additional follow up Details #1::        left detailed vm on pt's hm # Additional Follow-up by: Lamar Sprinkles, CMA,  September 26, 2010 8:59 AM    New/Updated Medications: ZITHROMAX Z-PAK 250 MG TABS (AZITHROMYCIN) as dirrected PREDNISONE 10 MG TABS (PREDNISONE) Take 40mg  qd for 3 days, then 20 mg qd for 3 days, then 10mg  qd for 6 days, then stop. Take pc. Prescriptions: PREDNISONE 10 MG TABS (PREDNISONE) Take 40mg  qd for 3 days, then 20 mg qd for 3 days, then 10mg  qd for 6 days, then stop. Take pc.  #24 x 1   Entered and Authorized by:   Tresa Garter MD   Signed by:   Lamar Sprinkles, CMA on 09/26/2010   Method used:   Electronically to        [facility]* (retail)       [address]       [place], [place]  [address]       Ph: [phone]       Fax: [phone]   RxID:   [id number] Christena Deem Z-PAK 250 MG TABS (AZITHROMYCIN) as dirrected  #1 x 0   Entered and Authorized by:   Tresa Garter MD   Signed by:   Lamar Sprinkles, CMA on 09/26/2010   Method used:   Electronically to        [facility]* (retail)       [address]       [place], [place]  [address]       Ph: [phone]       Fax: [phone]   RxID:   [id number]

## 2010-10-12 NOTE — Progress Notes (Signed)
Summary: CALL FROM MD  Phone Note Call from Patient   Summary of Call: Pt left vm, she missed call from MD and req a call back on her cell - [phone]. Initial call taken by: Lamar Sprinkles, CMA,  August 18, 2010 5:31 PM  Follow-up for Phone Call        called and left a vm Follow-up by: Tresa Garter MD,  August 21, 2010 6:04 PM  Additional Follow-up for Phone Call Additional follow up Details #1::        Called and left a VM Additional Follow-up by: Tresa Garter MD,  August 22, 2010 4:19 PM    Additional Follow-up for Phone Call Additional follow up Details #2::    Spoke w/pt - will ref to [facility] Dr Verdie Mosher. Pls inform pt of the referral Thank you!  Follow-up by: Tresa Garter MD,  August 29, 2010 5:40 PM

## 2010-10-12 NOTE — Progress Notes (Signed)
  Phone Note Outgoing Call   Call placed by: Lanier Prude, [facility]),  September 15, 2010 8:32 AM Summary of Call: I reviewed paper chart and pt's last colon was 1998 and her last  tdap was 06/17/2007. I called pt to inform of this and she states she has had a colonoscopy since then. She states she will check with the office where she had it to get exact date.      Immunization History:  Tetanus/Td Immunization History:    Tetanus/Td:  historical (06/17/2007)

## 2010-10-12 NOTE — Progress Notes (Signed)
Summary: RESULTS  Phone Note Call from Patient Call back at Home Phone [phone]   Summary of Call: Patient is requesting results of cxr as soon as avail.  Initial call taken by: Lamar Sprinkles, CMA,  September 20, 2010 5:30 PM  Follow-up for Phone Call        don't have yet Follow-up by: Tresa Garter MD,  September 20, 2010 5:32 PM  Additional Follow-up for Phone Call Additional follow up Details #1::        CXR - no pneumonia Additional Follow-up by: Tresa Garter MD,  September 20, 2010 11:56 PM    Additional Follow-up for Phone Call Additional follow up Details #2::    Pt informed Follow-up by: Margaret Pyle, CMA,  September 21, 2010 9:21 AM

## 2010-10-12 NOTE — Assessment & Plan Note (Signed)
Summary: cough,congestion/cd   Vital Signs:  Patient profile:   76 year old female Height:      64 inches Weight:      157 pounds BMI:     27.05 Temp:     98.2 degrees F oral Pulse rate:   80 / minute Pulse rhythm:   regular Resp:     16 per minute BP sitting:   106 / 72  (left arm) Cuff size:   regular  Vitals Entered By: Lanier Prude, CMA(AAMA) (September 20, 2010 10:42 AM) CC: cough, sore throat, sinus pain/congestion X 3 days Is Patient Diabetic? No   CC:  cough, sore throat, and sinus pain/congestion X 3 days.  History of Present Illness: The patient presents with complaints of sore throat, fever, cough, sinus congestion and drainge of several days duration. Not better with OTC meds. Chest hurts with coughing. Can't sleep due to cough. Muscle aches are present.  The mucus is colored.   Current Medications (verified): 1)  Super Probiotic   Caps (Misc Intestinal Flora Regulat) .... Once Daily 2)  Nexium 40 Mg  Cpdr (Esomeprazole Magnesium) .... Once Daily 3)  Estradiol 1 Mg  Tabs (Estradiol) .... Once Daily 4)  Toprol Xl 25 Mg  Tb24 (Metoprolol Succinate) .... 1/2 By Mouth Bid 5)  Multivitamins   Tabs (Multiple Vitamin) .... Once Daily 6)  Ativan 1 Mg  Tabs (Lorazepam) .Marland Kitchen.. 1 At Bedtime Prn 7)  Zyrtec Allergy 10 Mg  Tabs (Cetirizine Hcl) .... Otc As Needed 8)  Sumatriptan Succinate 100 Mg Tabs (Sumatriptan Succinate) .Marland Kitchen.. 1 By Mouth Once Daily As Needed For Migraine 9)  Tramadol Hcl 50 Mg Tabs (Tramadol Hcl) .... One Tab By Mouth Q6h As Needed For Pain 10)  Cymbalta 60 Mg Cpep (Duloxetine Hcl) .Marland Kitchen.. 1 By Mouth Once Daily For Depression 11)  Nitroglycerin 0.1 Mg/hr Pt24 (Nitroglycerin) .... Use 1/2 Patch Daily As Directed 12)  Ibuprofen 600 Mg Tabs (Ibuprofen) .Marland Kitchen.. 1 By Mouth Bid  Pc X 1 Wk Then As Needed For  Pain  Allergies (verified): No Known Drug Allergies  Past History:  Past Medical History: Last updated: 08/24/2009 Colonic polyps, hx of GERD Hypertension hx  of brief SVT symptomatic PAC's Anxiety Allergic rhinitis IBS - constipation predominant Dr Kinnie Scales Diverticulosis, colon Gyn Dr Eda Paschal L hip bursitis Dr Wyline Mood, post-op seroma 2010 Partial old gluteus medius and minor tear on MRI 2010  Review of Systems       The patient complains of fever, chest pain, and prolonged cough.  The patient denies dyspnea on exertion.    Physical Exam  General:  Well-developed,well-nourished,in no acute distress;  tired; alert,appropriate and cooperative throughout examination Head:  Normocephalic and atraumatic without obvious abnormalities. No apparent alopecia or balding. Mouth:  Erythematous throat and intranasal mucosa c/w URI  Lungs:  mild rhonchi at L base Heart:  Normal rate and regular rhythm. S1 and S2 normal without gallop, murmur, click, rub or other extra sounds. Abdomen:  soft and non-tender.     Impression & Recommendations:  Problem # 1:  COUGH (ICD-786.2) Assessment New  Orders: Admin of Therapeutic Inj  intramuscular or subcutaneous (91478) Depo- Medrol 80mg  (J1040) Depo- Medrol 40mg  (J1030) T-2 View CXR, Same Day (71020.5TC)  Problem # 2:  UPPER RESPIRATORY INFECTION, ACUTE (ICD-465.9) Assessment: New Poss mild pneumonia. Levaquin x 10 d Her updated medication list for this problem includes:    Zyrtec Allergy 10 Mg Tabs (Cetirizine hcl) ..... Otc as needed    Ibuprofen  600 Mg Tabs (Ibuprofen) .Marland Kitchen... 1 by mouth bid  pc x 1 wk then as needed for  pain    Tessalon Perles 100 Mg Caps (Benzonatate) .Marland Kitchen... 1-2 by mouth two times a day as needed cogh    Tussionex Pennkinetic Er 8-10 Mg/67ml Lqcr (Chlorpheniramine-hydrocodone) .Marland KitchenMarland KitchenMarland KitchenMarland Kitchen 5 ml by mouth two times a day as needed cough  Complete Medication List: 1)  Super Probiotic Caps (Misc intestinal flora regulat) .... Once daily 2)  Nexium 40 Mg Cpdr (Esomeprazole magnesium) .... Once daily 3)  Estradiol 1 Mg Tabs (Estradiol) .... Once daily 4)  Toprol Xl 25 Mg Tb24 (Metoprolol  succinate) .... 1/2 by mouth bid 5)  Multivitamins Tabs (Multiple vitamin) .... Once daily 6)  Ativan 1 Mg Tabs (Lorazepam) .Marland Kitchen.. 1 at bedtime prn 7)  Zyrtec Allergy 10 Mg Tabs (Cetirizine hcl) .... Otc as needed 8)  Sumatriptan Succinate 100 Mg Tabs (Sumatriptan succinate) .Marland Kitchen.. 1 by mouth once daily as needed for migraine 9)  Tramadol Hcl 50 Mg Tabs (Tramadol hcl) .... One tab by mouth q6h as needed for pain 10)  Cymbalta 60 Mg Cpep (Duloxetine hcl) .Marland Kitchen.. 1 by mouth once daily for depression 11)  Nitroglycerin 0.1 Mg/hr Pt24 (Nitroglycerin) .... Use 1/2 patch daily as directed 12)  Ibuprofen 600 Mg Tabs (Ibuprofen) .Marland Kitchen.. 1 by mouth bid  pc x 1 wk then as needed for  pain 13)  Accu-chek Aviva Strp (Glucose blood) .... Check qd 14)  Levaquin 500 Mg Tabs (Levofloxacin) .Marland Kitchen.. 1 by mouth qd 15)  Tessalon Perles 100 Mg Caps (Benzonatate) .Marland Kitchen.. 1-2 by mouth two times a day as needed cogh 16)  Tussionex Pennkinetic Er 8-10 Mg/77ml Lqcr (Chlorpheniramine-hydrocodone) .... 5 ml by mouth two times a day as needed cough  Patient Instructions: 1)  Use over-the-counter medicines for "cold": Tylenol  650mg  or Advil 400mg  every 6 hours  for fever; Delsym or Robutussin for cough. Mucinex or Mucinex D for congestion. Ricola or Halls for sore throat. Office visit if not better or if worse.  Prescriptions: TUSSIONEX PENNKINETIC ER 8-10 MG/5ML LQCR (CHLORPHENIRAMINE-HYDROCODONE) 5 ml by mouth two times a day as needed cough  #100 ml x 0   Entered and Authorized by:   Tresa Garter MD   Signed by:   Tresa Garter MD on 09/20/2010   Method used:   Print then Give to Patient   RxID:   [id number] TESSALON PERLES 100 MG CAPS (BENZONATATE) 1-2 by mouth two times a day as needed cogh  #120 x 1   Entered and Authorized by:   Tresa Garter MD   Signed by:   Tresa Garter MD on 09/20/2010   Method used:   Electronically to        [facility]* (retail)       [address]        [place], [place]  [address]       Ph: [phone]       Fax: [phone]   RxID:   [id number] LEVAQUIN 500 MG TABS (LEVOFLOXACIN) 1 by mouth qd  #10 x 0   Entered and Authorized by:   Tresa Garter MD   Signed by:   Tresa Garter MD on 09/20/2010   Method used:   Electronically to        [facility]* (retail)       [address]       [place], [place]  [address]  Ph: [phone]       Fax: [phone]   RxID:   [id number] ACCU-CHEK AVIVA  STRP (GLUCOSE BLOOD) check qd  #100 x 3   Entered and Authorized by:   Tresa Garter MD   Signed by:   Tresa Garter MD on 09/20/2010   Method used:   Electronically to        [facility]* (retail)       [address]       [place], [place]  [address]       Ph: [phone]       Fax: [phone]   RxID:   [id number]    Medication Administration  Injection # 1:    Medication: Depo- Medrol 40mg     Diagnosis: COUGH (ICD-786.2)    Route: IM    Site: RUOQ gluteus    Exp Date: 03/10/2013    Lot #: [id number]    Mfr: [employer]    Comments: pt rec 120mg     Patient tolerated injection without complications    Given by: Lanier Prude, CMA(AAMA) (September 20, 2010 11:22 AM)  Injection # 2:    Medication: Depo- Medrol 80mg     Diagnosis: COUGH (ICD-786.2)    Comments: same as above  Orders Added: 1)  Admin of Therapeutic Inj  intramuscular or subcutaneous [96372] 2)  Depo- Medrol 80mg  [J1040] 3)  Depo- Medrol 40mg  [J1030] 4)  T-2 View CXR, Same Day [71020.5TC] 5)  Est. Patient Level III [18841]

## 2010-10-12 NOTE — Assessment & Plan Note (Signed)
Summary: PELVIC PAIN/DIDN'T WANT TO SEE ANOTHER DOC TUES/NWS   Vital Signs:  Patient profile:   76 year old female Height:      64 inches Weight:      158 pounds BMI:     27.22 Temp:     98.0 degrees F oral Pulse rate:   68 / minute Pulse rhythm:   regular Resp:     16 per minute BP sitting:   128 / 90  (left arm) Cuff size:   regular  Vitals Entered By: Lanier Prude, Beverly Gust) (September 06, 2010 9:26 AM) CC: LBP& Hip X 4 days/groin pain  Is Patient Diabetic? No   CC:  LBP& Hip X 4 days/groin pain .  History of Present Illness: The patient presents for a follow up of back pain, and headaches.  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 Once Daily 2)  Nexium 40 Mg  Cpdr (Esomeprazole Magnesium) .... Once Daily 3)  Estradiol 1 Mg  Tabs (Estradiol) .... Once Daily 4)  Toprol Xl 25 Mg  Tb24 (Metoprolol Succinate) .... 1/2 By Mouth Bid 5)  Multivitamins   Tabs (Multiple Vitamin) .... Once Daily 6)  Ativan 1 Mg  Tabs (Lorazepam) .Marland Kitchen.. 1 At Bedtime Prn 7)  Zyrtec Allergy 10 Mg  Tabs (Cetirizine Hcl) .... Otc As Needed 8)  Sumatriptan Succinate 100 Mg Tabs (Sumatriptan Succinate) .Marland Kitchen.. 1 By Mouth Once Daily As Needed For Migraine 9)  Tramadol Hcl 50 Mg Tabs (Tramadol Hcl) .... One Tab By Mouth Q6h As Needed For Pain 10)  Cymbalta 60 Mg Cpep (Duloxetine Hcl) .Marland Kitchen.. 1 By Mouth Once Daily For Depression 11)  Nitroglycerin 0.1 Mg/hr Pt24 (Nitroglycerin) .... 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    Physical Exam  General:  Well-developed,well-nourished,in no acute distress; alert,appropriate and cooperative throughout examination Mouth:  Oral mucosa and oropharynx without lesions or exudates.  Teeth in good repair. Lungs:  Normal respiratory effort, chest expands symmetrically. Lungs are clear to auscultation, no crackles or wheezes. Heart:  Normal rate and regular rhythm. S1 and S2 normal without gallop, murmur, click, rub or other extra sounds. Abdomen:  soft and non-tender.   Msk:  Hip flexion good bilat Quad strength good bilat SI joints move freely Neg straight leg raise bilat hip rotation strength good on left Sartorius testing causes pain on left Left hip abduction weak- 3/5 Lumbar-sacral spine is tender to palpation over paraspinal muscles and painfull with the ROM  Tender L1-2  Extremities:  No edema B Neurologic:  No cranial nerve deficits noted. Station and gait are normal. Plantar reflexes are down-going bilaterally. DTRs are symmetrical throughout. Sensory, motor and coordinative functions appear intact. Skin:  Intact without suspicious lesions or rashes Psych:  Oriented X3, not anxious appearing, not depressed appearing   Impression & Recommendations:  Problem # 1:  BURSITIS, LEFT HIP (ICD-726.5) Assessment Unchanged On NTG patch per Dr Darrick Penna  Problem # 2:  LOW BACK PAIN, ACUTE (ICD-724.2) MSK Assessment: New  Her updated medication list for this problem includes:    Tramadol Hcl 50 Mg Tabs (Tramadol hcl) ..... One tab by mouth q6h as needed for pain    Ibuprofen 600 Mg Tabs (Ibuprofen) .Marland Kitchen... 1 by mouth bid  pc x 1 wk then as needed for  pain  Orders: T-Lumbar Spine 2 Views (72100TC)  Problem # 3:  GROIN PAIN (ICD-789.09)  R Assessment: Unchanged Ortho cons at [facility] is pending  Her updated medication list for this problem includes:    Tramadol Hcl 50 Mg Tabs (Tramadol hcl) ..... One tab by mouth q6h as needed for pain    Ibuprofen 600 Mg Tabs (Ibuprofen) .Marland Kitchen... 1 by mouth bid  pc x 1 wk then as needed for  pain  Complete Medication List: 1)  Super Probiotic Caps (Misc intestinal flora regulat) .... Once daily 2)  Nexium 40 Mg Cpdr (Esomeprazole magnesium) .... Once daily 3)  Estradiol 1 Mg Tabs (Estradiol) .... Once daily 4)  Toprol Xl 25 Mg Tb24 (Metoprolol succinate) .... 1/2 by mouth bid 5)  Multivitamins Tabs (Multiple vitamin) .... Once daily 6)  Ativan 1 Mg Tabs (Lorazepam) .Marland Kitchen.. 1 at bedtime prn 7)  Zyrtec Allergy 10 Mg Tabs (Cetirizine hcl) .... Otc as needed 8)  Sumatriptan Succinate 100 Mg Tabs (Sumatriptan succinate) .Marland Kitchen.. 1 by mouth once daily as needed for migraine 9)  Tramadol Hcl 50 Mg Tabs (Tramadol hcl) .... One tab by mouth q6h as needed for pain 10)  Cymbalta 60 Mg Cpep (Duloxetine hcl) .Marland Kitchen.. 1 by mouth once daily for depression 11)  Nitroglycerin 0.1 Mg/hr Pt24 (Nitroglycerin) .... Use 1/2 patch daily as directed 12)  Ibuprofen 600 Mg Tabs (Ibuprofen) .Marland Kitchen.. 1 by mouth bid  pc x 1 wk then as needed for  pain  Patient Instructions: 1)  Call if you are not better in a reasonable amount of time or if worse.  Prescriptions: IBUPROFEN 600 MG TABS (IBUPROFEN) 1 by mouth bid  pc x 1 wk then as needed for  pain  #60 x 1   Entered and Authorized by:   Tresa Garter MD   Signed by:   Tresa Garter MD on 09/06/2010   Method used:   Print then Give to Patient   RxID:   [id number]    Orders Added: 1)  T-Lumbar Spine 2 Views [72100TC] 2)  Est. Patient Level IV [95284]

## 2010-10-12 NOTE — Progress Notes (Signed)
Summary: WORK IN  Phone Note Call from Patient Call back at Home Phone [phone]   Caller: [phone] Call For: Tresa Garter MD Summary of Call: Pt requesting workin appt w/Dr Plotnikov tomorrow. Pt refuses to see another MD. Pt has congestion, cough and it is getting worse. Initial call taken by: Verdell Face,  September 19, 2010 4:37 PM  Follow-up for Phone Call        ok Follow-up by: Tresa Garter MD,  September 19, 2010 8:25 PM  Additional Follow-up for Phone Call Additional follow up Details #1::        Pt has appt for today at 10:30 am. Additional Follow-up by: Verdell Face,  September 20, 2010 9:25 AM

## 2010-10-12 NOTE — Progress Notes (Signed)
Summary: Cymbalta ?  Phone Note From Pharmacy   Caller: [facility]* Summary of Call: rec Rf for Cymbalta 60mg . 1 by mouth once daily #30.....Marland KitchenMarland KitchenOur list says 30mg  1 by mouth once daily. Please advise ok to Rf? Initial call taken by: Lanier Prude, [facility]),  August 25, 2010 8:54 AM  Follow-up for Phone Call        ok 60 mg Follow-up by: Tresa Garter MD,  August 25, 2010 12:43 PM    New/Updated Medications: CYMBALTA 60 MG CPEP (DULOXETINE HCL) 1 by mouth once daily for depression Prescriptions: CYMBALTA 60 MG CPEP (DULOXETINE HCL) 1 by mouth once daily for depression  #30 x 6   Entered and Authorized by:   Tresa Garter MD   Signed by:   Lamar Sprinkles, CMA on 08/25/2010   Method used:   Electronically to        [facility]* (retail)       [address]       [place], [place]  [address]       Ph: [phone]       Fax: [phone]   RxID:   [id number]

## 2010-10-12 NOTE — Progress Notes (Signed)
Summary: RF -ATIVAN  Phone Note Refill Request Message from:  Pharmacy  Refills Requested: Medication #1:  ATIVAN 1 MG  TABS 1 at bedtime prn Initial call taken by: Lamar Sprinkles, CMA,  September 12, 2010 12:32 PM  Follow-up for Phone Call        ok 6 ref Follow-up by: Tresa Garter MD,  September 12, 2010 9:27 PM    Prescriptions: ATIVAN 1 MG  TABS (LORAZEPAM) 1 at bedtime prn  #90 x 0   Entered by:   Lamar Sprinkles, CMA   Authorized by:   Tresa Garter MD   Signed by:   Lamar Sprinkles, CMA on 09/13/2010   Method used:   Telephoned to ...       [facility]* (retail)       [address]       [place], [place]  [address]       Ph: [phone]       Fax: [phone]   RxID:   [id number]

## 2010-10-13 NOTE — Medication Information (Signed)
Summary: Denied/[facility]cription[facility]  Denied/[facility]cription[facility]olutions   Imported By: Lester [place] 11/16/2009 09:06:38  _____________________________________________________________________  External Attachment:    Type:   Image     Comment:   External Document

## 2010-10-23 ENCOUNTER — Ambulatory Visit (INDEPENDENT_AMBULATORY_CARE_PROVIDER_SITE_OTHER): Payer: MEDICARE | Admitting: Sports Medicine

## 2010-10-23 ENCOUNTER — Encounter: Payer: Self-pay | Admitting: Sports Medicine

## 2010-10-23 DIAGNOSIS — R109 Unspecified abdominal pain: Secondary | ICD-10-CM

## 2010-10-24 ENCOUNTER — Encounter: Payer: Self-pay | Admitting: Internal Medicine

## 2010-11-01 NOTE — Assessment & Plan Note (Signed)
Summary: FU/MC/MJD   Vital Signs:  Patient profile:   76 year old female BP sitting:   125 / 89  Vitals Entered By: Lillia Pauls CMA (October 23, 2010 11:05 AM)  History of Present Illness: pain located on left anterior hip: Pain now not as much of a problem when up and walking around-- now pain is just off and on.  Things that make the pain worse: reaching behind to get something, getting in and out of car, getting into bed, sitting for prolonged period of time, or standing for long period of time,  if walks too much.  Still keeps her from doing all that she would like.  ex.  sat on bench at ballgame x 1 hour- caused pain for the rest of the day.  also having some lower back pain.   note we have tried her on NTG protocol and pain is reduced since then no real relief w injection of CS  Allergies: No Known Drug Allergies  Physical Exam  General:   BP 125/89 Well-developed,well-nourished,in no acute distress; alert,appropriate and cooperative throughout examination Msk:  point tenderness on palaptaion of ASIS. - left strength 5/5 in legs bilateral. normal internal and external rotation of hip. 70 degrees of total rotation in hips bilateral.  minimal pain with flexion of left hip at ASIS down into left groin area. no pain on extension of left leg.    Impression & Recommendations:  Problem # 1:  GROIN PAIN (ICD-789.09) most likely 2/2 sartorius tendinitis- pt to continue home  exercises.  continue nitroglycerin therapy- reviewed with pt optimal placement location.  return in 6 weeks for f/up.   Her updated medication list for this problem includes:    Tramadol Hcl 50 Mg Tabs (Tramadol hcl) ..... One tab by mouth q6h as needed for pain    Ibuprofen 600 Mg Tabs (Ibuprofen) .Marland Kitchen... 1 by mouth bid  pc x 1 wk then as needed for  pain  Complete Medication List: 1)  Super Probiotic Caps (Misc intestinal flora regulat) .... Once daily 2)  Nexium 40 Mg Cpdr (Esomeprazole magnesium) ....  Once daily 3)  Estradiol 1 Mg Tabs (Estradiol) .... Once daily 4)  Toprol Xl 25 Mg Tb24 (Metoprolol succinate) .... 1/2 by mouth bid 5)  Multivitamins Tabs (Multiple vitamin) .... Once daily 6)  Ativan 1 Mg Tabs (Lorazepam) .Marland Kitchen.. 1 at bedtime prn 7)  Zyrtec Allergy 10 Mg Tabs (Cetirizine hcl) .... Otc as needed 8)  Sumatriptan Succinate 100 Mg Tabs (Sumatriptan succinate) .Marland Kitchen.. 1 by mouth once daily as needed for migraine 9)  Tramadol Hcl 50 Mg Tabs (Tramadol hcl) .... One tab by mouth q6h as needed for pain 10)  Cymbalta 60 Mg Cpep (Duloxetine hcl) .Marland Kitchen.. 1 by mouth once daily for depression 11)  Nitroglycerin 0.1 Mg/hr Pt24 (Nitroglycerin) .... Use 1/2 patch daily as directed 12)  Ibuprofen 600 Mg Tabs (Ibuprofen) .Marland Kitchen.. 1 by mouth bid  pc x 1 wk then as needed for  pain 13)  Accu-chek Aviva Strp (Glucose blood) .... Check qd 14)  Tessalon Perles 100 Mg Caps (Benzonatate) .Marland Kitchen.. 1-2 by mouth two times a day as needed cogh 15)  Tussionex Pennkinetic Er 8-10 Mg/74ml Lqcr (Chlorpheniramine-hydrocodone) .... 5 ml by mouth two times a day as needed cough 16)  Prednisone 10 Mg Tabs (Prednisone) .... Take 40mg  qd for 3 days, then 20 mg qd for 3 days, then 10mg  qd for 6 days, then stop. take pc.  Patient Instructions: 1)  continue  home exercises. 2)  Continue nitroglycerin patch into the area that we marked today. 3)  return in 6 weeks for follow up.   Orders Added: 1)  Est. Patient Level III [62952]

## 2010-11-16 NOTE — Consult Note (Signed)
[facility]   Imported By: Lennie Odor 11/10/2010 15:20:24  _____________________________________________________________________  External Attachment:    Type:   Image     Comment:   External Document

## 2010-11-22 ENCOUNTER — Telehealth: Payer: Self-pay | Admitting: Internal Medicine

## 2010-11-28 NOTE — Progress Notes (Signed)
Summary: Rf Lorazepam  Phone Note Refill Request Message from:  Fax from Pharmacy  Refills Requested: Medication #1:  ATIVAN 1 MG  TABS 1 at bedtime prn   Dosage confirmed as above?Dosage Confirmed   Supply Requested: 90   Last Refilled: 09/13/2010  Method Requested: Telephone to Pharmacy Initial call taken by: Lanier Prude, [facility]),  November 22, 2010 2:27 PM  Follow-up for Phone Call        ok 3 ref Follow-up by: Tresa Garter MD,  November 22, 2010 11:11 PM  Additional Follow-up for Phone Call Additional follow up Details #1::        Rx called to pharmacy Additional Follow-up by: Lanier Prude, [facility]),  November 23, 2010 11:54 AM    Prescriptions: ATIVAN 1 MG  TABS (LORAZEPAM) 1 at bedtime prn  #90 x 3   Entered by:   Lanier Prude, [facility])   Authorized by:   Tresa Garter MD   Signed by:   Lanier Prude, CMA(AAMA) on 11/23/2010   Method used:   Telephoned to ...       [facility]* (retail)       [address]       [place], [place]  [id number]       Ph: [phone]       Fax: [phone]   RxID:   [id number]

## 2010-12-04 ENCOUNTER — Ambulatory Visit: Payer: MEDICARE | Admitting: Sports Medicine

## 2010-12-06 ENCOUNTER — Encounter: Payer: Self-pay | Admitting: Sports Medicine

## 2010-12-06 ENCOUNTER — Ambulatory Visit (INDEPENDENT_AMBULATORY_CARE_PROVIDER_SITE_OTHER): Payer: MEDICARE | Admitting: Sports Medicine

## 2010-12-06 DIAGNOSIS — R109 Unspecified abdominal pain: Secondary | ICD-10-CM

## 2010-12-06 NOTE — Assessment & Plan Note (Signed)
Still appears to be sartorius tendinitis, that is in the healing phase. -Continue the nitroglycerin 0.1 mg 1/2 patch daily -Continue her standing hip exercises but add some light weights as well -Continue her walking and her Pilates -Plan to followup in 2-3 months and repeat ultrasound scan at that time

## 2010-12-06 NOTE — Progress Notes (Signed)
  Subjective:    Patient ID: Hailey [name], female    DOB: 1935/01/28, 76 y.o.   MRN: [id number]  HPI 76 year old female here to followup on her left anterior hip pain felt most likely to be sartorius tendinitis. She has been on a nitroglycerin patch for 3 months now, but has only had it in correct location over anterior hip x 1-2 months. She also is doing her home exercises. She is also been doing Pilates twice weekly with a Electrical engineer. This appears to be helping her quite a bit. She continues to walk 2 miles 3 times per week, and never gets pain during her walk but more soreness afterward. She states that her left anterior hip pain is about 40-50% better. She has been switching of the location of the nitroglycerin patch. She is happy with her progress but does want to continue getting better. Notices that she can walk up stairs well but carrying something going up stairs or while lifting a tray she does have some increased pain in the left anterior hip She continues to take occasional tramadol. She denies any problems eating or drinking, bowel or bladder problems. She states she is up to date on her colonoscopy, and has had a complete hysterectomy as well. She does have a history of a left trochanteric bursectomy by Dr. Dianah Field   Review of Systems Negative for fevers, chills, sweats, weight loss, bowel or bladder changes, neurologic symptoms.    Objective:   Physical Exam Gen. appearance: Well-appearing female in no distress Leg lengths left leg half to 1 cm longer than the right leg Pelvis: Positive tenderness to palpation over the ASIS on the left side and mild tenderness over the left iliac crest region. Normal flexibility with pelvic rock. Negative Faber bilaterally. Negative FAI impingement and Stinchfield maneuver. Negative sartorius test. Improved strength with hip abduction,abduction, external rotation and an internal rotation. Normal hip flexion strength.       Assessment  & Plan:

## 2010-12-13 ENCOUNTER — Encounter (INDEPENDENT_AMBULATORY_CARE_PROVIDER_SITE_OTHER): Payer: MEDICARE | Admitting: Obstetrics and Gynecology

## 2010-12-13 DIAGNOSIS — B373 Candidiasis of vulva and vagina: Secondary | ICD-10-CM

## 2010-12-13 DIAGNOSIS — M899 Disorder of bone, unspecified: Secondary | ICD-10-CM

## 2010-12-13 DIAGNOSIS — R82998 Other abnormal findings in urine: Secondary | ICD-10-CM

## 2010-12-13 DIAGNOSIS — B3731 Acute candidiasis of vulva and vagina: Secondary | ICD-10-CM

## 2010-12-13 DIAGNOSIS — N951 Menopausal and female climacteric states: Secondary | ICD-10-CM

## 2010-12-13 DIAGNOSIS — N766 Ulceration of vulva: Secondary | ICD-10-CM

## 2010-12-16 LAB — BASIC METABOLIC PANEL
BUN: 20 mg/dL (ref 6–23)
CO2: 22 mEq/L (ref 19–32)
Calcium: 8.8 mg/dL (ref 8.4–10.5)
Chloride: 111 mEq/L (ref 96–112)
Creatinine, Ser: 1.02 mg/dL (ref 0.4–1.2)
GFR calc Af Amer: >60 mL/min (ref >60)
GFR calc non Af Amer: 53 mL/min — ABNORMAL LOW (ref >60)
Glucose, Bld: 95 mg/dL (ref 70–99)
Potassium: 3.7 mEq/L (ref 3.5–5.1)
Sodium: 139 mEq/L (ref 135–145)

## 2010-12-16 LAB — POCT HEMOGLOBIN-HEMACUE: Hemoglobin: 13.5 g/dL (ref 12.0–15.0)

## 2010-12-18 ENCOUNTER — Ambulatory Visit (INDEPENDENT_AMBULATORY_CARE_PROVIDER_SITE_OTHER): Payer: Medicare Other | Admitting: Internal Medicine

## 2010-12-18 ENCOUNTER — Encounter: Payer: Self-pay | Admitting: Internal Medicine

## 2010-12-18 ENCOUNTER — Other Ambulatory Visit (INDEPENDENT_AMBULATORY_CARE_PROVIDER_SITE_OTHER): Payer: Medicare Other

## 2010-12-18 DIAGNOSIS — I10 Essential (primary) hypertension: Secondary | ICD-10-CM

## 2010-12-18 DIAGNOSIS — R109 Unspecified abdominal pain: Secondary | ICD-10-CM

## 2010-12-18 DIAGNOSIS — R51 Headache: Secondary | ICD-10-CM

## 2010-12-18 DIAGNOSIS — T148XXA Other injury of unspecified body region, initial encounter: Secondary | ICD-10-CM | POA: Insufficient documentation

## 2010-12-18 DIAGNOSIS — N259 Disorder resulting from impaired renal tubular function, unspecified: Secondary | ICD-10-CM

## 2010-12-18 LAB — COMPREHENSIVE METABOLIC PANEL
ALT: 19 U/L (ref 0–35)
AST: 32 U/L (ref 0–37)
Albumin: 3.6 g/dL (ref 3.5–5.2)
Alkaline Phosphatase: 52 U/L (ref 39–117)
BUN: 21 mg/dL (ref 6–23)
CO2: 26 mEq/L (ref 19–32)
Calcium: 9 mg/dL (ref 8.4–10.5)
Chloride: 101 mEq/L (ref 96–112)
Creatinine, Ser: 1 mg/dL (ref 0.4–1.2)
GFR: 55.05 mL/min — ABNORMAL LOW (ref >60.00)
Glucose, Bld: 81 mg/dL (ref 70–99)
Potassium: 4.4 mEq/L (ref 3.5–5.1)
Sodium: 135 mEq/L (ref 135–145)
Total Bilirubin: 0.5 mg/dL (ref 0.3–1.2)
Total Protein: 6.4 g/dL (ref 6.0–8.3)

## 2010-12-18 LAB — CBC WITH DIFFERENTIAL/PLATELET
Basophils Absolute: 0 10*3/uL (ref 0.0–0.1)
Basophils Relative: 0.4 % (ref 0.0–3.0)
Eosinophils Absolute: 0.2 10*3/uL (ref 0.0–0.7)
Eosinophils Relative: 3.5 % (ref 0.0–5.0)
HCT: 36.8 % (ref 36.0–46.0)
Hemoglobin: 12.8 g/dL (ref 12.0–15.0)
Lymphocytes Relative: 24 % (ref 12.0–46.0)
Lymphs Abs: 1.4 10*3/uL (ref 0.7–4.0)
MCHC: 34.8 g/dL (ref 30.0–36.0)
MCV: 92.1 fl (ref 78.0–100.0)
Monocytes Absolute: 0.5 10*3/uL (ref 0.1–1.0)
Monocytes Relative: 9 % (ref 3.0–12.0)
Neutro Abs: 3.6 10*3/uL (ref 1.4–7.7)
Neutrophils Relative %: 63.1 % (ref 43.0–77.0)
Platelets: 261 10*3/uL (ref 150.0–400.0)
RBC: 3.99 Mil/uL (ref 3.87–5.11)
RDW: 14.4 % (ref 11.5–14.6)
WBC: 5.7 10*3/uL (ref 4.5–10.5)

## 2010-12-18 LAB — SEDIMENTATION RATE: Sed Rate: 29 mm/hr — ABNORMAL HIGH (ref 0–22)

## 2010-12-18 LAB — TSH: TSH: 1.93 u[IU]/mL (ref 0.35–5.50)

## 2010-12-18 LAB — PROTIME-INR
INR: 0.8 ratio (ref 0.8–1.0)
Prothrombin Time: 9.6 s — ABNORMAL LOW (ref 10.2–12.4)

## 2010-12-18 LAB — APTT: aPTT: 45.5 s — ABNORMAL HIGH (ref 21.7–28.8)

## 2010-12-18 MED ORDER — HYDROCHLOROTHIAZIDE 12.5 MG PO CAPS: 12.5000 mg | ORAL_CAPSULE | Freq: Every day | ORAL | Status: DC

## 2010-12-18 NOTE — Assessment & Plan Note (Signed)
Will check CBC, INR, PTT

## 2010-12-18 NOTE — Assessment & Plan Note (Signed)
Check BMET 

## 2010-12-18 NOTE — Patient Instructions (Signed)
You can try Arnica and Vit C 500 mg a day

## 2010-12-18 NOTE — Assessment & Plan Note (Signed)
No change. F/u with Dr Darrick Penna

## 2010-12-18 NOTE — Progress Notes (Signed)
  Subjective:    Patient ID: Hailey [name], female    DOB: 1934/10/26, 76 y.o.   MRN: [id number]  HPI  C/o bruising - not new C/o elev. BP lately F/u hip pain. C/o HAs sometimes  Review of Systems  Constitutional: Negative for appetite change and fatigue.  HENT: Negative for neck pain and tinnitus.   Eyes: Negative for pain.  Respiratory: Negative for chest tightness and shortness of breath.   Cardiovascular: Negative for chest pain.  Musculoskeletal: Negative for back pain.  Neurological: Negative for dizziness, speech difficulty and weakness.  Hematological: Negative for adenopathy. Bruises/bleeds easily.  Psychiatric/Behavioral: Negative for behavioral problems and dysphoric mood.       BP Readings from Last 3 Encounters:  12/18/10 148/98  12/06/10 143/95  10/23/10 125/89    Objective:   Physical Exam  Constitutional: She appears well-developed and well-nourished. No distress.  HENT:  Head: Normocephalic.  Right Ear: External ear normal.  Left Ear: External ear normal.  Nose: Nose normal.  Mouth/Throat: Oropharynx is clear and moist.  Eyes: Conjunctivae are normal. Pupils are equal, round, and reactive to light. Right eye exhibits no discharge. Left eye exhibits no discharge.  Neck: Normal range of motion. Neck supple. No JVD present. No tracheal deviation present. No thyromegaly present.  Cardiovascular: Normal rate, regular rhythm and normal heart sounds.   Pulmonary/Chest: No stridor. No respiratory distress. She has no wheezes.  Abdominal: Soft. Bowel sounds are normal. She exhibits no distension and no mass. There is no tenderness. There is no rebound and no guarding.  Musculoskeletal: She exhibits no edema and no tenderness.  Lymphadenopathy:    She has no cervical adenopathy.  Neurological: She displays normal reflexes. No cranial nerve deficit. She exhibits normal muscle tone. Coordination normal.  Skin: No rash noted. No erythema.       Tanned. Bruises on  UE and LE B  Psychiatric: She has a normal mood and affect. Her behavior is normal. Judgment and thought content normal.          Assessment & Plan:  Bruising Will check CBC, INR, PTT  RENAL INSUFFICIENCY Check BMET  HEADACHE It seems to be triggered by wine at times.   HYPERTENSION Will add HCTZ at low dose.  GROIN PAIN No change. F/u with Dr Darrick Penna

## 2010-12-18 NOTE — Assessment & Plan Note (Signed)
It seems to be triggered by wine at times.

## 2010-12-18 NOTE — Assessment & Plan Note (Signed)
Will add HCTZ at low dose.

## 2011-01-10 ENCOUNTER — Telehealth: Payer: Self-pay | Admitting: *Deleted

## 2011-01-10 DIAGNOSIS — T148XXA Other injury of unspecified body region, initial encounter: Secondary | ICD-10-CM

## 2011-01-10 NOTE — Telephone Encounter (Signed)
Factor XI and factor VII levels need to be checked. 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 We may need to stop Cymbalta. I'll call the pt.

## 2011-01-10 NOTE — Telephone Encounter (Signed)
Pt called req lab results from 12-18-10. I called her and gave her the results. I told her I would double check if you want to change her current med regimen or if she needs to follow up before July. Please advise

## 2011-01-11 NOTE — Telephone Encounter (Signed)
I called the pt - she is better (off Advil). OK to cont Cymbalta She has a Sports Medd appt tomorrow w/[facility] We will check labs incl FXI and FVII

## 2011-01-23 NOTE — Op Note (Signed)
Hailey [name], Hailey [name]               ACCOUNT NO.:  [id number]   MEDICAL RECORD NO.:  [id number]          PATIENT TYPE:  AMB   LOCATION:  DSC                          FACILITY:  [facility]   PHYSICIAN:  Robert A. Thurston Hole, M.D. DATE OF BIRTH:  Feb 01, 1935   DATE OF PROCEDURE:  04/18/2009  DATE OF DISCHARGE:                               OPERATIVE REPORT   PREOPERATIVE DIAGNOSES:  1. Left hip greater trochanteric bursitis with the iliotibial band      tendonitis.  2. Right knee prepatellar bursitis.   POSTOPERATIVE DIAGNOSES:  1. Left hip greater trochanteric bursitis with the iliotibial band      tendonitis.  2. Right knee prepatellar bursitis.   PROCEDURES:  1. Left hip examination under anesthesia followed by greater      trochanteric bursectomy.  2. Left hip iliotibial band tenotomy.  3. Right knee prepatellar bursectomy.   SURGEON:  Elana Alm. Thurston Hole, MD   ASSISTANT:  Julien Girt, PA   ANESTHESIA:  General.   OPERATIVE TIME:  1 hour.   COMPLICATIONS:  None.   INDICATIONS FOR PROCEDURE:  Hailey [name] is a 76 year old woman who has had  significant pain in both her left hip and right knee with recurrent  bursitis documented in the hip by MRI who has failed multiple injections  therapy and medications.  She is now to undergo left hip bursectomy and  iliotibial band tenotomy and right knee prepatellar bursectomy.   DESCRIPTION OF PROCEDURE:  Hailey [name] was brought to the operating room  on April 18, 2009 and placed on the operative table in supine position.  She received Ancef 1 gram IV preoperatively for prophylaxis.  After  being placed under general anesthesia, she was turned to the left  lateral decubitus position and secured on the bed with a sandbag.  Her  left hip was examined.  She had full range of motion and her hip was  stable.  Her left hip area was then prepped using sterile DuraPrep and  draped using sterile technique.  Originally, through a 4-5 cm  curvilinear  incision based over the greater trochanter, an initial  exposure was made.  The underlying subcutaneous tissues were incised  along with skin incision.  The iliotibial band was incised  longitudinally and a star-type incision was made in order to do a  tenotomy in this area.  Underneath this, she was found to have very  inflamed and gritty bursal tissue which was thoroughly removed.  The  underlying greater trochanter had very roughened areas with calcium  deposits on it and this was thoroughly removed and then a rasp used to  smooth this area down.  The underlying gluteus maximus tendon and vastus  lateralis tendon were still well attached to the greater trochanter.  After this was done, the hip was brought through a satisfactory range of  motion and there was no further irritation or rubbing over the  iliotibial band and this tenotomy area was left open.  The wound was  then thoroughly irrigated and then the subcutaneous tissues closed with  0 and 2-0 Vicryl and subcuticular  layer closed with 4-0 Monocryl.  Sterile dressings were applied.  She was then turned supine.  Her right  knee was examined.  She had full range of motion of her knee with stable  ligamentous exam.   The right leg was prepped using sterile DuraPrep and draped using  sterile technique.  Originally, through a 3-4 cm longitudinal incision  based over the prepatellar bursa initial exposure was made.  The  underlying subcutaneous tissues were incised along with skin incision.  The underlying bursal tissue was found to be very thickened and  edematous and this was thoroughly resected.  The underlying patella and  fascia over the patella was normal.  The knee joint itself was not  entered.  The wound could be brought through a full range of motion with  no further bursal tissue noted in this area.  The wound was then  irrigated and closed with 2-0 Vicryl and 4-0 Prolene.  Sterile dressings  were applied.  The patient was  then awakened, extubated, and taken to  the recovery room in stable condition.  Needle and sponge counts were  correct x2 at the end of the case.   FOLLOWUP CARE:  Hailey [name] will be followed as an outpatient on Percocet  and Mobic.  Seen back in office in a week for sutures out and followup.      Robert A. Thurston Hole, M.D.  Electronically Signed     RAW/MEDQ  D:  04/18/2009  T:  04/19/2009  Job:  [id number]

## 2011-01-26 ENCOUNTER — Other Ambulatory Visit (INDEPENDENT_AMBULATORY_CARE_PROVIDER_SITE_OTHER): Payer: Medicare Other | Admitting: Internal Medicine

## 2011-01-26 ENCOUNTER — Other Ambulatory Visit (INDEPENDENT_AMBULATORY_CARE_PROVIDER_SITE_OTHER): Payer: Medicare Other

## 2011-01-26 DIAGNOSIS — T148XXA Other injury of unspecified body region, initial encounter: Secondary | ICD-10-CM

## 2011-01-26 LAB — PROTIME-INR
INR: 1 ratio (ref 0.8–1.0)
Prothrombin Time: 10.8 s (ref 10.2–12.4)

## 2011-01-26 LAB — APTT: aPTT: 47.2 s — ABNORMAL HIGH (ref 21.7–28.8)

## 2011-01-26 NOTE — Assessment & Plan Note (Signed)
[facility]                           GASTROENTEROLOGY OFFICE NOTE   NAME:Paulick, DENEANE STIFTER                      MRN:          [id number]  DATE:06/05/2006                            DOB:          07-28-1935    Corky comes in to discuss her colonoscopic examination.  She has had  significant number of these in the past and several small polyps have been  seen.  Her last one was 4 1/2 years previous.  She indicated that her  primary care doctor suggested she follow this up since she was basically due  for it.  I told her since she is having no GI symptoms that we could  probably do this anytime she would like, sometime within the next few months  at her convenience.   PHYSICAL EXAMINATION:  As far as her physical examination is concerned she  has gained some weight but her vital signs are normal.  Neck, heart and extremities are all unremarkable.  ABDOMEN:  Abdomen was specifically soft.  No mass or organomegaly.  INGUINAL AND SUPRACLAVICULAR AREA:  Well within normal limits.  EXTREMITIES:  Unremarkable.   IMPRESSION:  1. Colon with previous polyps.  2. Mild obesity.  3. Hypertension.   RECOMMENDATIONS:  Recommend she have her procedure scheduled sometime in the  near future at her convenience.            ______________________________  Ulyess Mort, MD      SML/MedQ  DD:  06/05/2006  DT:  06/07/2006  Job #:  [id number]

## 2011-01-26 NOTE — Op Note (Signed)
[facility]  Patient:    Hailey [name], Hailey [name] Visit Number: [id number] MRN: [id number]          Service Type: SUR Location: 3W 0371 01 Attending Physician:  Laqueta Jean Dictated by:   Rande Brunt Eda Paschal, M.D. Proc. Date: 02/05/02 Admit Date:  02/05/2002 Discharge Date: 02/06/2002                             Operative Report  PREOPERATIVE DIAGNOSIS:  Uterine prolapse, cystocele and rectocele.  POSTOPERATIVE DIAGNOSIS:  Uterine prolapse, cystocele and rectocele.  OPERATION:  Vaginal hysterectomy, bilateral salpingo-oophorectomy, anterior and posterior repair.  SURGEON:  Daniel L. Eda Paschal, M.D.  FIRST ASSISTANT:  Raynald Kemp, M.D.  FINDINGS AT THE TIME OF SURGERY:  The patient had third degree uterine descensus, a second degree cystocele, second degree rectocele.  Ovaries, tubes and ovary perineum were all free of disease.  DESCRIPTION OF PROCEDURE:  After adequate general endotracheal anesthesia, the patient was placed in the dorsal lithotomy position and prepped and draped in the usual sterile manner.  A 1:200,000 solution of epinephrine and 0.25% xylocaine was injected around the cervix.  A 360 degree incision was made around the cervix.  The bladder and the posterior peritoneum were dissected free. The vesicouterine fold of perineum and the posterior perineum were sharply entered without injury.  The uterosacral ligaments were clamped.  On clamping them they were shortened.  They were sutured to the vault laterally for good vault support.  Cardinal ligaments and uterine arteries, balance of the broad ligament were then clamped, cut and suture ligated.  Suture material was #1 chromic catgut.  The uterus was delivered.  The ovaries and tubes were delivered and then the infundibular pelvic ligaments were clamped, cut and doubly suture ligated with #1 chromic catgut and the uterus, ovaries and tubes were sent to pathology for tissue  diagnosis.  The vaginal cuff was whipped stitched with 0 Vicryl incorporating the cuff and the perineum and once again supporting the uterosacral ligaments.  Copious irrigation was done with Ringers lactate and then a double layer of a modified McCall enterocele suture was placed to prevent an enterocele.  The cuff was then closed with figure-of-eights of #1 chronic catgut.  At this time, Dr. Lynelle Smoke I. Tannenbaum scrubbed in and did a pubovaginal sling which is dictated under his note.  After he was finished, Dr. Eda Paschal rescrubbed in.  The patient still had a significant cystocele.  The anterior vaginal mucosa was undermined to just at the level where he did the sling and the perivesical fascia was sharply dissected free. The redundant vaginal mucosa was trimmed away and then the vesicle fascia was brought together with interrupted of 2-0 Vicryl completing eliminating the cystocele.  It took about ten sutures to do so. The anterior vaginal mucosa was then also closed with 2-0 Vicryl, also picking up tissue underneath which was the fascia to eliminate dead space.  Attention was next turned to the posterior repair.  The posterior vaginal mucosa was undermined all the way from the introitus to the top of the cuff and the perirectal fascia and the rectocele was dissected free. Some of the dissection was done with the surgeons finger in the rectum to prevent rectal injury. Once it had been completely dissected free the perirectal fascia was brought together to eliminate the rectocele.  About eight sutures were utilized of 2-0 Vicryl.  Redundant posterior vaginal mucosa was trimmed  away and the posterior vaginal mucosa was closed with a running locking 2-0 Vicryl also picking up perirectal fascia to eliminate dead space.  At this point, the procedure was terminated.  The patient had a good perineum so no perineal support was done. Overall it appeared to be well supported.  There was no  cystocele, enterocele or rectocele so the procedure was terminated. A 1 inch Iodoform pack was placed.  The patient left the operating room in satisfactory condition draining clear urine from her Foley catheter. Dictated by:   Rande Brunt. Eda Paschal, M.D. Attending Physician:  Laqueta Jean DD:  02/05/02 TD:  02/06/02 Job: [id number]

## 2011-01-26 NOTE — H&P (Signed)
[facility]  Patient:    Hailey [name], SCALIA Visit Number: [id number] MRN: [id number]          Service Type: SUR Location: 3W 0371 01 Attending Physician:  Laqueta Jean Dictated by:   Rande Brunt Eda Paschal, M.D. Admit Date:  02/05/2002 Discharge Date: 02/06/2002                           History and Physical  CHIEF COMPLAINT: 1. Uterine prolapse. 2. Cystocele, rectocele. 3. Urinary stress incontinence.  HISTORY OF PRESENT ILLNESS:  The patient is a 76 year old gravida 2, para 2, ab 0 who presented to my office in April with a feeling as if her uterus was completely outside of the vagina.  She feels it hanging out.  She has a dragging sensation.  She has a lot of discomfort from it being prolapsed.  She is also having a lot of urgency and was seen by Sigmund I. Patsi Sears, who felt she should undergo a pubovaginal vaginal sling to correct that after her cystocele was repaired by me.  She also has had some difficulty getting stool out of her rectum and on physical examination she has third degree uterine descensus, almost a third degree cystocele and almost a second degree rectocele.  She now enters the hospital for vaginal hysterectomy, anterior and posterior repair and pubovaginal sling.  As she is post menopausal we also plan to remove her ovaries and tubes if technically possible vaginally.  We will not make a incision to remove them, however, unless there was obviously something wrong with them.  PAST MEDICAL HISTORY: 1. Breast reduction in 1996. 2. Appendectomy.  PRESENT MEDICATIONS: 1. Allegra. 2. Prilosec. 3. Estrace 1/2 a mg daily. 4. Provera 2.5 mg daily. 5. Calcium. 6. Ativan.  ALLERGIES:  She is allergic to no drugs.  FAMILY HISTORY:  Mother had colon cancer, otherwise it is unremarkable.  SOCIAL HISTORY:  She is a nonsmoker.  She drinks alcohol socially.  She is a widow.  REVIEW OF SYSTEMS:  HEENT:  Negative.   Cardiac:  Negative.  Respiratory: Negative.  GI:  Negative, except for difficulty getting stool out.  GU:  See above as well as Dr. Hollie Beach history.  Neuromuscular:  Negative. Endocrine:  Negative.  PHYSICAL EXAMINATION: The patient is a well-developed well-nourished female in no acute distress.  VITAL SIGNS:  Blood pressure is 116/70, her pulse is 70 and regular, respirations 16 and nonlabored.  She is afebrile.  HEENT:  All within normal limits.  NECK:  Supple.  Trachea midline.  Thyroid is not enlarged.  LUNGS:  Clear to percussion and auscultation.  HEART:  No thrills, heaves or murmurs.  BREASTS:  No masses.  ABDOMEN: Soft without guarding, rebound or masses.  PELVIC:  External genitalia is within normal limits.  BUS is within normal limits.  Bladder shows almost a third degree cystocele.  Vagina shows almost a second degree rectocele.  Cervix is clean.  Uterus is normal size and shape with third degree uterine descensus.  Adnexa failed to reveal masses.  RECTAL:  Negative.  ADMISSION IMPRESSION:  Uterine prolapse, cystocele, rectocele.  PLAN:  See above. Dictated by:   Rande Brunt. Eda Paschal, M.D. Attending Physician:  Laqueta Jean DD:  02/05/02 TD:  02/06/02 Job: [id number]

## 2011-01-26 NOTE — Assessment & Plan Note (Signed)
[facility]                              CARDIOLOGY OFFICE NOTE   NAME:Hailey [name], Hailey [name]                      MRN:          [id number]  DATE:05/15/2006                            DOB:          [birth year]    The patient is a very pleasant 76 year old white, married female with  history of palpitations, documented brief SVT, resolved with reduction in  caffeine and Toprol XL 25. The patient had a normal 2D echocardiogram with  an EF of 70%.  Negative stress Myoview.  She has a Holter monitor revealing  brief 4 to 5 beat SVT.   The patient states that she is off caffeine but is still noticing some  palpitations, these sound like individual ectopic beats and nothing  sustained. She has no chest discomfort, shortness of breath or dizziness.   MEDICATIONS:  1. Ativan 1 mg.  2. Estradiol.  3. Allegra 180.  4. Centrum.  5. Toprol XL 25.  6. Nexium 40.  Aspirin discontinued because of some bruising.   PHYSICAL EXAMINATION:  VITAL SIGNS:  Blood pressure 139/93, pulse 60, normal  sinus rhythm.  GENERAL APPEARANCE:  Normal.  NECK:  JVP is not elevated. Carotid pulses without bruits.  LUNGS:  Clear.  CARDIAC EXAM:  Normal.  ABDOMINAL EXAM:  Normal.  EXTREMITIES:  Normal.   EKG:  Poor R wave progression V1-V3, otherwise normal.   IMPRESSION:  Diagnosis above. Hypertension is new. I suggest the patient  check her pressure daily for the next 2 weeks and fax this report here. If  she is having increasing palpitations then  she can be increased to Toprol XL 50.  Will plan to see her back in 6  months. She is to get a routine check up in 2 months with Dr. Posey Rea.  Will obtain lab work at that time.                              Cecil Cranker, MD, [facility]    EJL/MedQ  DD:  05/15/2006  DT:  05/15/2006  Job #:  [id number]   cc:   Georgina Quint. Plotnikov, MD

## 2011-01-26 NOTE — Assessment & Plan Note (Signed)
[facility]                           PRIMARY CARE OFFICE NOTE   NAME:[name], Hailey [name]                      MRN:          [id number]  DATE:07/31/2006                            DOB:          September 25, 1934    The patient presents today with complaint of severe pain in the neck on  the right side going down the right arm, of about a 3-1/2 week duration.  After she came back from [place] the pain was extremely severe and she  went to see Dr. Newell Coral who ordered an MRI of the C-spine, MRI of the  shoulder and brachial plexus. There were no findings to explain pain.  She did not respond to pain medications and antiinflammatories. She  subsequently underwent cervical spine myelogram. She had some low back  pain and had a scan of the lumbar spine as well. There was no rash on  the skin. The pain was dull, a 10 out of 10 without obvious  paresthesias. Initially there was some weakness that later resolved.   Four weeks ago she was at a theater in [place]. When the performance  was over she felt like she was going to pass out, she saw stars in front  of her eyes, had a very low blood pressure and was taken to the  emergency room. They have ask her to cut back on her blood pressure  medicines. It was thought that she was dehydrated at the time.   ALLERGIES:  None.   CURRENT MEDICATIONS:  1. Neurontin.  2. Oxycodone.  3. Prednisone.  4. Recently started Toprol XL 25 mg daily. She went off Norvasc 4      weeks ago.   FAMILY HISTORY:  Unchanged.   SOCIAL HISTORY:  She is married. Quit smoking a long time ago.   REVIEW OF SYSTEMS:  Unable to exercise due to pain. Negative for chest  pain. Constipated; no blood in the stool. No fever, chills, headache or  recent syncopal episodes. The rest of the 18 point system review is  negative.   PHYSICAL EXAMINATION:  VITAL SIGNS: Blood pressure 116/83, pulse 56.  Temperature 97.2. Weight 125 pounds.  GENERAL: She  is in no acute distress.  HEENT: Moist mucous mucosa.  NECK: Supple, no thyromegaly or bruit, tender with range of motion,  mostly over the right paraspinal and deltoid muscles. Shoulder  tenderness with range of motion. Subacromial bursa seemed to be slightly  sensitive. Muscles nontender, no atrophy, no skin rash is present.  Reflexes symmetric, normal. Muscle strength symmetric, normal.  NEURO: She is alert and oriented, cooperative.   LABS:  On July 26, 2006 potassium 5.3, creatinine 1.5, cholesterol  197, LDL 90. CBC normal. TSH 5.92.   A recent MRI of the right shoulder revealed subacromial bursitis, she  does not seem to have much pain with this at present. EKG with sinus  bradycardia. Myelogram, MRI of the C-spine report reviewed with the  patient.   ASSESSMENT AND PLAN:  1. Right-sided cervical pain and right upper extremity pain of unclear      etiology.  Possible radiculopathy. She rates her pain 6 out of 10 at      present. It is a little better compared to yesterday. Will use      empiric Valtrex 1 gram t.i.d. x7 days. She has been on prednisone      taper starting yesterday. Off Relafen. Duragesic 25 mcg q. 3 days.      Oxycodone p.r.n., Neurontin p.r.n. Avoid over sedation. I will see      her back in 10 days.  2. Constipation from pain medications. MiraLax 17 grams daily. Other      laxative as needed.  3. Near syncopal episode a month ago likely to be related to      dehydration. Norvasc was stopped. She will continue with Toprol XL      25 mg daily.  4. Elevated potassium and creatinine. Increase water intake. Will      recheck in 1 week.  5. Elevated TSH. Recheck in 1 week.     Hailey Quint. Plotnikov, MD  Electronically Signed    AVP/MedQ  DD: 07/31/2006  DT: 07/31/2006  Job #: [id number]   cc:   Hailey [name], M.D.

## 2011-01-26 NOTE — Op Note (Signed)
[facility]  Patient:    Hailey [name], Hailey [name] Visit Number: [id number] MRN: [id number]          Service Type: SUR Location: 3W [id number] 01 Attending Physician:  Laqueta Jean Dictated by:   Vonzell Schlatter Patsi Sears, M.D. Proc. Date: 02/05/02 Admit Date:  02/05/2002 Discharge Date: 02/06/2002   CC:         Reuel Boom L. Eda Paschal, M.D.   Operative Report  PREOPERATIVE DIAGNOSIS:  Pelvic prolapse.  POSTOPERATIVE DIAGNOSIS:  Pelvic prolapse.  OPERATION:  Pubovaginal sling.  SURGEON:  Sigmund I. Patsi Sears, M.D.  Threasa HeadsAllyne Gee, P.A.-C.  PREPARATION:  After appropriate preanesthesia, the patient was brought to the operating room and place upon the operating table in the dorsal supine position.  General endotracheal anesthesia was induced.  She was then placed in candy-cane stirrups, where the patient underwent vaginal hysterectomy per Dr. Eda Paschal and Dr. Rosalia Hammers.  DESCRIPTION OF PROCEDURE:  Following the above surgery, the urethra was evaluated, Foley catheter placed and drained of clear fluid.  Then 6 cc of Marcaine 0.25 with epinephrine 1:200,000 was injected into the periurethral vaginal epithelial.  An incision was made measuring approximately 1.5 cm. Subcutaneous tissue was dissected over the urethra, and lateral root vents of the pelvic fascia.  Two separate incisions were then made lateral within the suprapubic area, and subcutaneous tissue dissected with the electrosurgical unit.  The Sparc needles were then placed bilaterally, and following placement, cystoscopy was accomplished.  This showed good placement at the level of bladder neck, and no evidence of interruption of the bladder.  The Sparc sling was then placed in standard fashion and brought into the retropubic space. Cystoscopy was again accomplished, which again showed no evidence of any sling within the bladder.  The sling was then placed in standard fashion with a right-angled  clamp behind the urethra, to ensure that the sling was not placed too tight.  The sleeves were cut subcutaneously, and the plastic sheath was removed.  The urethra was then closed in two layers with 3-0 Vicryl suture.  No bleeding was noted during the procedure.  Foley catheter was replaced.  The patient then underwent anterior vaginal vault repair and posterior vaginal vault repair per Dr. Eda Paschal. Dictated by:   Vonzell Schlatter Patsi Sears, M.D. Attending Physician:  Laqueta Jean DD:  02/05/02 TD:  02/06/02 Job: [id number]

## 2011-01-26 NOTE — Assessment & Plan Note (Signed)
[facility]                            CARDIOLOGY OFFICE NOTE   NAME:Hailey [name], Hailey [name]                      MRN:          [id number]  DATE:11/14/2006                            DOB:          06-12-1935    HISTORY OF PRESENT ILLNESS:  The patient is a 76 year old white married  female with history of palpitations, documented brief SVT, resolved  with eliminating caffeine and taking Toprol XL 25.  She has normal 2D  echo, possibly hyperdynamic LV function.  She had a negative stress  Myoview.  Holter monitor revealed 4-5 beats SVT.  She continues to have  occasional palpitations lasting only a few seconds.  She had an episode  of severe dizziness in [place] in October, was seen in the emergency  room, told that her blood pressure was quite low.  Lotrel was stopped.  She has had occasional dizziness since that time.  Presently, she is on  fish oil and  aspirin 81, Lorazepam 1. cholesterol has been normal.  VITAL SIGNS:  Blood pressure 159/92, pulse 55, status bradycardia.  GENERAL APPEARANCE:  Normal.  NECK:  JVD is elevated.  Carotid pulses are palpable, without bruits.  LUNGS:  Clear.  CARDIAC:  There is no murmur or gallop.  ABDOMEN:  Normal.  No mass, no pulsation.  EXTREMITIES:  Normal.   STUDIES:  EKG reveals poor R wave progression in V1, V3.  Otherwise,  normal and unchanged.   DIAGNOSES:  1. Hypertension.  2. Occasional dizziness.  3. History of brief SVT.   PLAN:  I suggest the patient try Norvasc 5 mg daily.  She is to check  her pressures closely at home and call me in 3-4 weeks or p.r.n.   I have not given her a definite return appointment, but she will follow  up with Dr. Posey Rea and we will be happy to see her at any time in the  future so desired by her or Dr. Posey Rea.     Cecil Cranker, MD, [facility]  Electronically Signed    EJL/MedQ  DD: 11/14/2006  DT: 11/14/2006  Job #: [id number]

## 2011-01-29 ENCOUNTER — Other Ambulatory Visit: Payer: Self-pay | Admitting: Internal Medicine

## 2011-01-29 LAB — FACTOR 7 ASSAY: Factor VII Activity: 193 % — ABNORMAL HIGH (ref 60–150)

## 2011-01-29 LAB — FACTOR 11 ASSAY: Factor XI Activity: 38 % — ABNORMAL LOW (ref 65–150)

## 2011-01-31 ENCOUNTER — Telehealth: Payer: Self-pay | Admitting: Internal Medicine

## 2011-01-31 DIAGNOSIS — D681 Hereditary factor XI deficiency: Secondary | ICD-10-CM | POA: Insufficient documentation

## 2011-01-31 NOTE — Telephone Encounter (Signed)
Message copied by Sonda Primes on Wed Jan 31, 2011 10:59 PM ------      Message from: Lanier Prude      Created: Tue Jan 30, 2011  8:05 AM                   ----- Message -----         From: Lab In [facility] Interface         Sent: 01/26/2011   3:26 PM           To: Lanier Prude, CMA

## 2011-01-31 NOTE — Telephone Encounter (Signed)
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  Please, mail the labs to the patient.

## 2011-02-01 NOTE — Telephone Encounter (Signed)
Left mess for patient to call back.  

## 2011-02-06 NOTE — Telephone Encounter (Signed)
Pt informed. She wants to know is there anything she should/not do because of this deficiency. Please advise

## 2011-02-06 NOTE — Telephone Encounter (Signed)
Pt returned your call and req you call her back at [phone]

## 2011-02-06 NOTE — Telephone Encounter (Signed)
We need check labs if bleeding or bruising is bad and prior to surgeries Thx

## 2011-02-07 NOTE — Telephone Encounter (Signed)
Pt informed

## 2011-02-14 ENCOUNTER — Other Ambulatory Visit: Payer: Self-pay | Admitting: Sports Medicine

## 2011-03-25 ENCOUNTER — Other Ambulatory Visit: Payer: Self-pay | Admitting: Internal Medicine

## 2011-04-02 ENCOUNTER — Other Ambulatory Visit: Payer: Self-pay | Admitting: Internal Medicine

## 2011-04-02 ENCOUNTER — Encounter: Payer: Self-pay | Admitting: Obstetrics and Gynecology

## 2011-04-02 ENCOUNTER — Ambulatory Visit (INDEPENDENT_AMBULATORY_CARE_PROVIDER_SITE_OTHER): Payer: Medicare Other | Admitting: Obstetrics and Gynecology

## 2011-04-02 DIAGNOSIS — B3731 Acute candidiasis of vulva and vagina: Secondary | ICD-10-CM

## 2011-04-02 DIAGNOSIS — B373 Candidiasis of vulva and vagina: Secondary | ICD-10-CM

## 2011-04-02 DIAGNOSIS — N898 Other specified noninflammatory disorders of vagina: Secondary | ICD-10-CM

## 2011-04-02 DIAGNOSIS — N899 Noninflammatory disorder of vagina, unspecified: Secondary | ICD-10-CM

## 2011-04-02 LAB — POCT WET PREP (WET MOUNT)

## 2011-04-02 NOTE — Progress Notes (Addendum)
The patient came in today with a 4 to five-day history of vulvar and vaginal discharge and itching. She has a history of both recurrent yeast infections and lichens sclerosis. She tried over-the-counter Monistat without any results. The  Exam external shows severe vulvitis as well as lichen sclerosis. Vaginal examination shows irritation and discharge. Wet prep was positive for yeast.  Assessment yeast vaginitis, history of lichen sclerosis.  Plan to her,terconazole- 3 cream use both externally and internally.

## 2011-04-04 ENCOUNTER — Ambulatory Visit: Payer: Medicare Other | Admitting: Obstetrics and Gynecology

## 2011-04-06 ENCOUNTER — Telehealth: Payer: Self-pay

## 2011-04-06 MED ORDER — FLUCONAZOLE 200 MG PO TABS: 200.0000 mg | ORAL_TABLET | Freq: Every day | ORAL | Status: AC

## 2011-04-06 NOTE — Telephone Encounter (Signed)
PT FINISHED TERAZOLE RX DR. GOTTSEGEN GAVE HER 7/23 OV. PERSISTENT VULVAR ITCHING AND WHITE DISCHARGE. SYMPTOMS . PLEASE ADVISE.

## 2011-04-06 NOTE — Telephone Encounter (Signed)
PER DR. FONTAINES NOTE. PT NOTIFIED & RX WAS SENT TO PHARMACY BY HIM.

## 2011-04-06 NOTE — Telephone Encounter (Signed)
The patient will go with a higher longer dose of Diflucan 200 daily x5 days and I escribed this already

## 2011-04-10 ENCOUNTER — Telehealth: Payer: Self-pay

## 2011-04-10 DIAGNOSIS — B373 Candidiasis of vulva and vagina: Secondary | ICD-10-CM

## 2011-04-10 DIAGNOSIS — B3731 Acute candidiasis of vulva and vagina: Secondary | ICD-10-CM

## 2011-04-10 MED ORDER — NYSTATIN-TRIAMCINOLONE 100000-0.1 UNIT/GM-% EX OINT: TOPICAL_OINTMENT | Freq: Two times a day (BID) | CUTANEOUS | Status: DC

## 2011-04-10 NOTE — Telephone Encounter (Signed)
Tell patient prescription is a drugstore.

## 2011-04-10 NOTE — Telephone Encounter (Signed)
ON 4TH DAY OF DIFLUCAN RX DR. FONTAINE RX'D FOR HER & STILL HAS EXTERNAL ITCHING(ONLY @NIGHT ) AND SLIGHT WHITE DISCHARGE. AT [facility] 'TIL 04-15-11. WHAT ELSE CAN  SHE DO TO GET 100%RELIEF?

## 2011-04-11 NOTE — Telephone Encounter (Signed)
DOCUMENTING TODAY, BUT CALLED RX TO [facility]ENN[facility] [place] [facility] [place], SINCE PT THERE TIL NEXT WEEK. OK PER DR. GOTTSEGEN TO LEAVE ORIGINAL RX @ [facility] TOO.

## 2011-04-18 ENCOUNTER — Ambulatory Visit (INDEPENDENT_AMBULATORY_CARE_PROVIDER_SITE_OTHER): Payer: Medicare Other | Admitting: Obstetrics and Gynecology

## 2011-04-18 ENCOUNTER — Encounter: Payer: Self-pay | Admitting: Obstetrics and Gynecology

## 2011-04-18 ENCOUNTER — Ambulatory Visit: Payer: Medicare Other | Admitting: Obstetrics and Gynecology

## 2011-04-18 DIAGNOSIS — R82998 Other abnormal findings in urine: Secondary | ICD-10-CM

## 2011-04-18 DIAGNOSIS — B373 Candidiasis of vulva and vagina: Secondary | ICD-10-CM

## 2011-04-18 DIAGNOSIS — N898 Other specified noninflammatory disorders of vagina: Secondary | ICD-10-CM

## 2011-04-18 DIAGNOSIS — N899 Noninflammatory disorder of vagina, unspecified: Secondary | ICD-10-CM

## 2011-04-18 DIAGNOSIS — B3731 Acute candidiasis of vulva and vagina: Secondary | ICD-10-CM

## 2011-04-18 DIAGNOSIS — R300 Dysuria: Secondary | ICD-10-CM

## 2011-04-18 MED ORDER — TERCONAZOLE 0.8 % VA CREA: 1.0000 | TOPICAL_CREAM | Freq: Every day | VAGINAL | Status: DC

## 2011-04-18 NOTE — Progress Notes (Signed)
Patient came back today for further treatment of a vaginal infection. We originally to return terconazole 3 cream. She was then treated with Diflucan by Dr. Audie Box for 4 days. She then called again a shows at the beach and we treated her with my mytrex cream externally.she is now 90% better than when I first saw her her period. She has no vulvar symptoms but does feel some pulling in the vagina. Her urinalysis was slightly abnormal.  External within normal limits. BUS within normal limits. Vaginal examination looks much better but wet prep is still positive for yeast.  Impression: 1. Persistent yeast vaginitis 2. Possible UTI  Plan: Terconazole 3 cream for 3 more days, urine culture done.

## 2011-05-18 ENCOUNTER — Encounter: Payer: Self-pay | Admitting: Sports Medicine

## 2011-05-18 ENCOUNTER — Ambulatory Visit (INDEPENDENT_AMBULATORY_CARE_PROVIDER_SITE_OTHER): Payer: Medicare Other | Admitting: Sports Medicine

## 2011-05-18 VITALS — BP 134/88

## 2011-05-18 DIAGNOSIS — M25569 Pain in unspecified knee: Secondary | ICD-10-CM

## 2011-05-18 DIAGNOSIS — M25561 Pain in right knee: Secondary | ICD-10-CM | POA: Insufficient documentation

## 2011-05-18 NOTE — Assessment & Plan Note (Signed)
With degenerative meniscal tear. Injection as above. Knee sleeve. Quadriceps rehabilitation exercises. Come back to see [place] in 2-3 weeks.

## 2011-05-18 NOTE — Progress Notes (Signed)
  Subjective:    Patient ID: Hailey [name], female    DOB: 04-20-1935, 77 y.o.   MRN: [id number]  HPI The patient is a very pleasant 76 year old female with right anterolateral knee pain for approximately 1 week, that she notes is worse with bending her knee. The pain is sharp and is localized at the anterolateral joint line. She also notes some pain in her ankle since she's been having the knee pain. There is no swelling, no catching, no locking, no buckling. She has no trauma. She's currently taking ibuprofen daily without benefit. She does have a trip coming up October 1 and would like to have her pain resolved by then.  Of note she has been receiving PRP injections into the inguinal area by her sports medicine physician at [facility], her pain is improved.   Review of Systems    thoroughly reviewed and negative with regards to the chief complaint. Objective:   Physical Exam General: Well-developed, well-nourished Caucasian female in no acute distress. Skin: Warm and dry. Musculoskeletal: Right Knee: Normal to inspection with no erythema or effusion or obvious bony abnormalities. Palpation normal with no warmth,patellar tenderness, or condyle tenderness. Significant anterolateral joint line tenderness ROM full in flexion and extension and lower leg rotation. Ligaments with solid consistent endpoints including ACL, PCL, LCL, MCL. Positive McMurray's. Non painful patellar compression. Patellar glide without crepitus. Patellar and quadriceps tendons unremarkable. Hamstring and quadriceps strength is normal.   MSK ultrasound: Small tear noted on the undersurface of the anterolateral meniscus, all of the menisci normal. Images saved.  Consent obtained and verified. Time-out conducted. Noted no overlying erythema, induration, or other signs of local infection. Sterile betadine prep. Furthur cleansed with alcohol. Topical analgesic spray: Ethyl chloride. Joint: Right  knee Approached in typical fashion with: 25-gauge needle Completed without difficulty Meds: 2 cc Depo-Medrol 40, 4 cc lidocaine 1% Advised to call if fevers/chills, erythema, induration, drainage, or persistent bleeding.      Assessment & Plan:

## 2011-05-23 ENCOUNTER — Ambulatory Visit: Payer: Medicare Other | Admitting: Sports Medicine

## 2011-05-30 ENCOUNTER — Ambulatory Visit (INDEPENDENT_AMBULATORY_CARE_PROVIDER_SITE_OTHER): Payer: Medicare Other | Admitting: Sports Medicine

## 2011-05-30 ENCOUNTER — Encounter: Payer: Self-pay | Admitting: Sports Medicine

## 2011-05-30 VITALS — BP 136/88 | HR 63 | Ht 64.0 in | Wt 157.0 lb

## 2011-05-30 DIAGNOSIS — M25561 Pain in right knee: Secondary | ICD-10-CM

## 2011-05-30 DIAGNOSIS — M25569 Pain in unspecified knee: Secondary | ICD-10-CM

## 2011-05-30 NOTE — Assessment & Plan Note (Signed)
Patient still shows signs of a lateral meniscal degenerative tear. Patient though is asymptomatic at this time we will have patient continued the need sleeve with any type of activity. Patient will continue doing the quadricep exercises and she can followup on an as-needed basis.

## 2011-05-30 NOTE — Progress Notes (Signed)
  Subjective:    Patient ID: Hailey [name], female    DOB: April 20, 1935, 76 y.o.   MRN: [id number]  HPI The patient is a very pleasant 76 year old female with right anterolateral knee pain  Followup.   Patient  last visit had an injection as well as put in a knee sleeve. Patient states that her pain has been 0/10 and she's been able to do all her physical activities., patient also states that at this time her ankle pain has gotten much better.. There is no swelling, no catching, no locking, no buckling. She has no trauma. She's currently taking ibuprofen  As needed. She does have a trip coming up October 1 and would like to have her pain resolved by then.  Of note she has been receiving PRP injections into the inguinal area by Dr Carma Lair, her sports medicine physician at [facility], her pain is improved.   Review of Systems    thoroughly reviewed and negative with regards to the chief complaint. Objective:   Physical Exam General: Well-developed, well-nourished Caucasian female in no acute distress. Skin: Warm and dry. Musculoskeletal: Right Knee: Normal to inspection with no erythema or effusion or obvious bony abnormalities. Palpation normal with no warmth,patellar tenderness, or condyle tenderness. Mild anterolateral joint line tenderness ROM full in flexion and extension and lower leg rotation. Ligaments with solid consistent endpoints including ACL, PCL, LCL, MCL. Positive McMurray's. Non painful patellar compression. Patellar glide without crepitus. Patellar and quadriceps tendons unremarkable. Hamstring and quadriceps strength is normal.   MSK ultrasound: Small tear noted on the undersurface of the anterolateral meniscus no swelling no neovessels, all of other views of  menisci normal. Images saved.      Assessment & Plan:

## 2011-06-03 ENCOUNTER — Other Ambulatory Visit: Payer: Self-pay | Admitting: Internal Medicine

## 2011-06-04 ENCOUNTER — Other Ambulatory Visit: Payer: Self-pay

## 2011-06-04 DIAGNOSIS — B3731 Acute candidiasis of vulva and vagina: Secondary | ICD-10-CM

## 2011-06-04 DIAGNOSIS — B373 Candidiasis of vulva and vagina: Secondary | ICD-10-CM

## 2011-06-04 MED ORDER — NYSTATIN-TRIAMCINOLONE 100000-0.1 UNIT/GM-% EX OINT: TOPICAL_OINTMENT | Freq: Two times a day (BID) | CUTANEOUS | Status: DC

## 2011-06-04 NOTE — Telephone Encounter (Signed)
Okay to refill.  Thanks.

## 2011-06-04 NOTE — Telephone Encounter (Signed)
PT. GETTING READY TO GO ON ANOTHER TRIP SOON AND IS REQUESTING REFILLS OF MYTREX(MYCOLOG) TO HAVE ON HAND. NO CURRENT SYMPTOMS.

## 2011-06-04 NOTE — Telephone Encounter (Signed)
RX SENT TO PHARMACY. PT. NOTIFIED.

## 2011-06-06 ENCOUNTER — Ambulatory Visit (INDEPENDENT_AMBULATORY_CARE_PROVIDER_SITE_OTHER): Payer: Medicare Other | Admitting: Obstetrics and Gynecology

## 2011-06-06 DIAGNOSIS — N644 Mastodynia: Secondary | ICD-10-CM

## 2011-06-06 NOTE — Progress Notes (Signed)
Patient came to see me today with a one-month history of right nipple pain. She is not found any masses. She has no nipple discharge. She is not alter her estrogen patch.  Breast exam: Both breasts were inspected and appear normal without any nipple or skin changes. Both breasts were then examined both sitting and lying and no masses were found. She was very tender in her nipple however.  Assessment: Nipple pain  Plan: Diagnostic mammogram

## 2011-06-30 ENCOUNTER — Other Ambulatory Visit: Payer: Self-pay | Admitting: Internal Medicine

## 2011-06-30 ENCOUNTER — Telehealth: Payer: Self-pay | Admitting: *Deleted

## 2011-06-30 NOTE — Telephone Encounter (Signed)
Requested Medications     ATIVAN 1 MG tablet [Pharmacy Med Name: ATIVAN 1 MG TABLET]   TAKE 1 TABLET AT BEDTIME AS NEEDED.   Disp: 90 each Start: 06/30/2011  Class: Normal   Originally ordered on: 12/14/2010  Last refill: 04/04/2011  Order History      [facility]

## 2011-07-02 ENCOUNTER — Other Ambulatory Visit: Payer: Self-pay | Admitting: Internal Medicine

## 2011-07-02 NOTE — Telephone Encounter (Signed)
OK to fill this prescription with additional refills x5 Thank you!  

## 2011-07-03 ENCOUNTER — Telehealth: Payer: Self-pay | Admitting: *Deleted

## 2011-07-03 MED ORDER — LORAZEPAM 1 MG PO TABS: 1.0000 mg | ORAL_TABLET | Freq: Every evening | ORAL | Status: DC | PRN

## 2011-07-03 NOTE — Telephone Encounter (Signed)
Requested Medications     ATIVAN 1 MG tablet [Pharmacy Med Name: ATIVAN 1 MG TABLET]   TAKE 1 TABLET AT BEDTIME AS NEEDED.   Disp: 90 each Start: 07/02/2011

## 2011-07-03 NOTE — Telephone Encounter (Signed)
OK to fill this prescription with additional refills x2 Thank you!  

## 2011-07-11 ENCOUNTER — Ambulatory Visit
Admission: RE | Admit: 2011-07-11 | Discharge: 2011-07-11 | Disposition: A | Discharge status: Home / Self Care | Payer: Medicare Other | Source: Ambulatory Visit | Attending: Obstetrics and Gynecology | Admitting: Obstetrics and Gynecology

## 2011-07-11 DIAGNOSIS — N644 Mastodynia: Secondary | ICD-10-CM

## 2011-07-20 ENCOUNTER — Ambulatory Visit (INDEPENDENT_AMBULATORY_CARE_PROVIDER_SITE_OTHER): Payer: Medicare Other | Admitting: Sports Medicine

## 2011-07-20 VITALS — BP 130/88

## 2011-07-20 DIAGNOSIS — IMO0002 Reserved for concepts with insufficient information to code with codable children: Secondary | ICD-10-CM

## 2011-07-20 DIAGNOSIS — M25561 Pain in right knee: Secondary | ICD-10-CM

## 2011-07-20 DIAGNOSIS — M25569 Pain in unspecified knee: Secondary | ICD-10-CM

## 2011-07-20 DIAGNOSIS — S39011A Strain of muscle, fascia and tendon of abdomen, initial encounter: Secondary | ICD-10-CM

## 2011-07-20 NOTE — Patient Instructions (Addendum)
Ice massage for 20 minutes twice a day Moist heat at night for 20 minutes Core strengthening exercises Abdominal wall rotation and lateral motion not to a extreme range of motion. Aleeve 2 tab twice a day for 10 days. Extra cushion on her shoes.

## 2011-07-20 NOTE — Progress Notes (Signed)
Subjective:    Patient ID: Hailey [name], female    DOB: 06-13-1935, 76 y.o.   MRN: [id number]  HPI Ms. Ertle is complaining of left iliac crest pain after she returned from a trip on [place] and did major walking. She has had the pain for about a month.The pain is dull, 3/10, mostly at night, radiated to anterior iliac crest, worse with rotation of the trunk and walking. No LBP, no numbness or tingling. She denies any injuries to her hip or pelvis.  Her right knee is doing great, she is 90% better, mild ache if she does much. No swelling, no mechanical symptoms.  Patient Active Problem List  Diagnoses  . ANXIETY  . HYPERTENSION  . HYPOTENSION  . ALLERGIC RHINITIS  . GERD  . DIVERTICULOSIS, COLON  . IBS  . RENAL INSUFFICIENCY  . HIP PAIN  . DEGENERATIVE DISC DISEASE, LUMBOSACRAL SPINE  . BURSITIS, LEFT HIP  . SYNCOPE  . PARESTHESIA  . HEADACHE  . ABDOMINAL PAIN  . GROIN PAIN  . COLONIC POLYPS, HX OF  . TOBACCO USE, QUIT  . UPPER RESPIRATORY INFECTION, ACUTE  . LOW BACK PAIN, ACUTE  . COUGH  . Bruising  . Factor XI deficiency  . Right knee pain   Current Outpatient Prescriptions on File Prior to Visit  Medication Sig Dispense Refill  . cetirizine (ZYRTEC) 10 MG tablet Take 10 mg by mouth daily.        . CYMBALTA 60 MG capsule TAKE 1 CAPSULE ONCE DAILY FOR DEPRESSION.  30 each  4  . esomeprazole (NEXIUM) 40 MG capsule Take 40 mg by mouth daily before breakfast.        . estradiol (ESTRACE) 1 MG tablet Take 1 mg by mouth daily.        Marland Kitchen glucose blood (ACCU-CHEK AVIVA) test strip 1 each by Other route daily. Use as instructed       . hydrochlorothiazide (MICROZIDE) 12.5 MG capsule Take 1 capsule (12.5 mg total) by mouth daily.  90 capsule  3  . ibuprofen (ADVIL,MOTRIN) 600 MG tablet Take 600 mg by mouth 2 (two) times daily as needed.        . IMITREX 100 MG tablet TAKE 1 TABLET DAILY AS NEEDED FOR MIGRAINE.  12 each  3  . LORazepam (ATIVAN) 1 MG tablet Take 1 tablet (1 mg  total) by mouth at bedtime as needed.  90 tablet  3  . Multiple Vitamins-Minerals (MULTIVITAMIN,TX-MINERALS) tablet Take 1 tablet by mouth daily.        Marland Kitchen NITRO-DUR 0.1 MG/HR USE AS DIRECTED.  30 each  2  . nystatin-triamcinolone (MYCOLOG) ointment Apply topically 2 (two) times daily.  15 g  0  . Probiotic Product (SUPER PROBIOTIC PO) Take by mouth daily.        . TOPROL XL 25 MG 24 hr tablet TAKE (1/2) TABLET TWICE  DAILY.  60 each  5  . traMADol (ULTRAM) 50 MG tablet Take 50 mg by mouth every 6 (six) hours as needed.         No Known Allergies      Review of Systems  Constitutional: Negative for fever, chills, diaphoresis and fatigue.  Musculoskeletal: Negative for back pain, joint swelling and gait problem.  Neurological: Negative for weakness and numbness.       Objective:   Physical Exam  Constitutional: She is oriented to person, place, and time. She appears well-developed and well-nourished.       BP  130/88   Pulmonary/Chest: Effort normal.  Abdominal: Soft.  Musculoskeletal:       Left hip and iliac crest with intact skin, no swelling, no ecchymosis. L hip with FROM. No TTP in trochanteric area. TTP in the left iliac crest at the attachment of the abdominal wall muscles. Pain wit rotation and lateralization of the trunk. No LBP.   Neurological: She is alert and oriented to person, place, and time.  Skin: Skin is warm.  Psychiatric: She has a normal mood and affect.          Assessment & Plan:   1. Right knee pain   2. Abdominal muscle strain   Ice massage for 20 minutes twice a day Moist heat at night for 20 minutes Core strengthening exercises Abdominal wall rotation and lateral motion not to a extreme range of motion. Aleeve 2 tab twice a day for 10 days. F/U in 6 weeks.

## 2011-07-25 ENCOUNTER — Encounter: Payer: Self-pay | Admitting: Sports Medicine

## 2011-07-25 ENCOUNTER — Ambulatory Visit (INDEPENDENT_AMBULATORY_CARE_PROVIDER_SITE_OTHER): Payer: Medicare Other | Admitting: Sports Medicine

## 2011-07-25 VITALS — BP 125/87 | HR 68 | Ht 64.0 in | Wt 158.0 lb

## 2011-07-25 DIAGNOSIS — M25569 Pain in unspecified knee: Secondary | ICD-10-CM

## 2011-07-25 DIAGNOSIS — M25561 Pain in right knee: Secondary | ICD-10-CM

## 2011-07-25 DIAGNOSIS — M79669 Pain in unspecified lower leg: Secondary | ICD-10-CM

## 2011-07-25 DIAGNOSIS — M79609 Pain in unspecified limb: Secondary | ICD-10-CM

## 2011-07-25 NOTE — Assessment & Plan Note (Signed)
She responded very well to injection for what is thought to be DJD. She has had good response in September and we did advise her that she could have another injection if the knee becomes worse in the future.

## 2011-07-25 NOTE — Progress Notes (Signed)
  Subjective:    Patient ID: Hailey [name], female    DOB: [birth year], 76 y.o.   MRN: [id number]  HPI 76 year old female here for followup of:  Right knee pain: Injected on May 18, 2011, still essentially pain-free, this was for a degenerative meniscal tear. No mechanical symptoms.  Right calf pain: Present for approximately 1 week, no known injuries, worse with doing toe raises, she did at some point notice a knot in her calf. No radiation, no swelling, no numbness or tingling in the toes.   Review of Systems    negative with regards to the chief complaint Objective:   Physical Exam General:  Well developed, well nourished, and in no acute distress. Neuro:  Alert and oriented x3, extra-ocular muscles intact. Skin: Warm and dry. Respiratory:  Not using accessory muscles, speaking in full sentences. MSK: Calf is essentially nontender, range of motion is excellent with 5 out of 5 strength to all movements. She does have reproduction of her pain with toe raises, particularly with the knees locked. X. line  MSK ultrasound: I imaged the medial and lateral heads of the gastroc, as well as the soleus muscle. I noted no tears were scars.      Assessment & Plan:  1.  calf strain: Calf sleeve, calf strain rehabilitation exercises given. She may also use a heating pad and massage. She will followup with [place] on an as-needed basis.  2.  right knee pain: Continues to do excellent status post injection, she may follow up with [place] on an as-needed basis, we can reinject the knee if needed.

## 2011-07-30 ENCOUNTER — Ambulatory Visit: Payer: Medicare Other | Admitting: Sports Medicine

## 2011-08-04 ENCOUNTER — Emergency Department (HOSPITAL_COMMUNITY)
Admission: EM | Admit: 2011-08-04 | Discharge: 2011-08-04 | Disposition: A | Discharge status: Home / Self Care | Payer: Medicare Other | Source: Home / Self Care | Attending: Family Medicine | Admitting: Family Medicine

## 2011-08-04 ENCOUNTER — Encounter (HOSPITAL_COMMUNITY): Payer: Self-pay | Admitting: Emergency Medicine

## 2011-08-04 DIAGNOSIS — N390 Urinary tract infection, site not specified: Secondary | ICD-10-CM

## 2011-08-04 LAB — POCT URINALYSIS DIP (DEVICE)
Bilirubin Urine: NEGATIVE
Glucose, UA: NEGATIVE mg/dL
Ketones, ur: NEGATIVE mg/dL
Nitrite: NEGATIVE
Protein, ur: NEGATIVE mg/dL
Specific Gravity, Urine: 1.015 (ref 1.005–1.030)
Urobilinogen, UA: 0.2 mg/dL (ref 0.0–1.0)
pH: 7 (ref 5.0–8.0)

## 2011-08-04 MED ORDER — SULFAMETHOXAZOLE-TRIMETHOPRIM 800-160 MG PO TABS: 1.0000 | ORAL_TABLET | Freq: Two times a day (BID) | ORAL | Status: AC

## 2011-08-04 MED ORDER — FLUCONAZOLE 150 MG PO TABS: 150.0000 mg | ORAL_TABLET | Freq: Once | ORAL | Status: AC

## 2011-08-04 NOTE — ED Provider Notes (Signed)
History     CSN: [id number] Arrival date & time: 08/04/2011  3:14 PM   First MD Initiated Contact with Patient 08/04/11 1443      Chief Complaint  Patient presents with  . Urinary Tract Infection    (Consider location/radiation/quality/duration/timing/severity/associated sxs/prior treatment) HPI Comments: Corky presents for evaluation of malodorous, cloudy hematuria onset this morning. She denies any hx of UTI, ever, in her life. She denies any change in her activity or soaps or anything else. She is prone to yeast infections. She reports urinary frequency, urgency, with terminal dysuria today.   Patient is a 76 y.o. female presenting with urinary tract infection. The history is provided by the patient.  Urinary Tract Infection This is a new problem. The current episode started 12 to 24 hours ago. The problem occurs constantly. The symptoms are aggravated by nothing. The symptoms are relieved by nothing.    Past Medical History  Diagnosis Date  . History of colon polyps   . GERD (gastroesophageal reflux disease)   . HTN (hypertension)   . SVT (supraventricular tachycardia)     brief history  . PAC (premature atrial contraction)     Symptomatiic  . Anxiety   . Allergic rhinitis   . IBS (irritable bowel syndrome)     constipation predominant - Dr Kinnie Scales  . Diverticulosis of colon   . Hip bursitis 2010    Dr Wyline Mood, Post op seroma  . Cystocele   . Rectocele   . Osteopenia     Past Surgical History  Procedure Date  . Bladder surgery   . Breast reduction surgery   . Appendectomy   . Troch bursa resection 2010  . Vaginal hysterectomy     Family History  Problem Relation Age of Onset  . Hypertension Other   . Colon cancer Mother   . Cancer Mother     colon    History  Substance Use Topics  . Smoking status: Current Everyday Smoker  . Smokeless tobacco: Not on file  . Alcohol Use: No    OB History    Grav Para Term Preterm Abortions TAB SAB Ect Mult Living     2 2 2       2       Review of Systems  Constitutional: Negative.   HENT: Negative.   Eyes: Negative.   Respiratory: Negative.   Cardiovascular: Negative.   Gastrointestinal: Negative.   Genitourinary: Positive for dysuria, urgency and frequency. Negative for vaginal discharge.  Musculoskeletal: Negative.   Neurological: Negative.   Psychiatric/Behavioral: Negative.     Allergies  Review of patient's allergies indicates no known allergies.  Home Medications   Current Outpatient Rx  Name Route Sig Dispense Refill  . CETIRIZINE HCL 10 MG PO TABS Oral Take 10 mg by mouth daily.      . CYMBALTA 60 MG PO CPEP  TAKE 1 CAPSULE ONCE DAILY FOR DEPRESSION. 30 each 4  . ESOMEPRAZOLE MAGNESIUM 40 MG PO CPDR Oral Take 40 mg by mouth daily before breakfast.      . ESTRADIOL 1 MG PO TABS Oral Take 1 mg by mouth daily.      Marland Kitchen FLUCONAZOLE 150 MG PO TABS Oral Take 1 tablet (150 mg total) by mouth once. Take one pill once. May repeat if symptoms persist after 3rd day. 2 tablet 0  . HYDROCHLOROTHIAZIDE 12.5 MG PO CAPS Oral Take 1 capsule (12.5 mg total) by mouth daily. 90 capsule 3  . IMITREX 100 MG PO TABS  TAKE 1 TABLET DAILY AS NEEDED FOR MIGRAINE. 12 each 3  . LORAZEPAM 1 MG PO TABS Oral Take 1 tablet (1 mg total) by mouth at bedtime as needed. 90 tablet 3  . SUPER HIGH VITAMINS/MINERALS PO TABS Oral Take 1 tablet by mouth daily.      . NYSTATIN-TRIAMCINOLONE 100000-0.1 UNIT/GM-% EX OINT Topical Apply topically 2 (two) times daily. 15 g 0  . SUPER PROBIOTIC PO Oral Take by mouth daily.      . SULFAMETHOXAZOLE-TRIMETHOPRIM 800-160 MG PO TABS Oral Take 1 tablet by mouth 2 (two) times daily. 14 tablet 0  . TOPROL XL 25 MG PO TB24  TAKE (1/2) TABLET TWICE  DAILY. 60 each 5  . TRAMADOL HCL 50 MG PO TABS Oral Take 50 mg by mouth every 6 (six) hours as needed.        BP 158/92  Pulse 60  Temp(Src) 97.5 F (36.4 C) (Oral)  Resp 16  SpO2 96%  Physical Exam  Constitutional: She is oriented to  person, place, and time. She appears well-developed and well-nourished.  HENT:  Head: Normocephalic and atraumatic.  Eyes: EOM are normal.  Pulmonary/Chest: Effort normal.  Abdominal: Soft. Bowel sounds are normal. There is tenderness in the right lower quadrant and suprapubic area.  Neurological: She is alert and oriented to person, place, and time.  Skin: Skin is warm and dry.    ED Course  Procedures (including critical care time)  Labs Reviewed  POCT URINALYSIS DIP (DEVICE) - Abnormal; Notable for the following:    Hgb urine dipstick LARGE (*)    Leukocytes, UA MODERATE (*) Biochemical Testing Only. Please order routine urinalysis from main lab if confirmatory testing is needed.   All other components within normal limits  POCT URINALYSIS DIPSTICK   No results found.   1. UTI (lower urinary tract infection)       MDM  UA: LE moderate; nitrite negative        Richardo Priest, MD 08/04/11 2040

## 2011-08-04 NOTE — ED Notes (Signed)
Symptoms this am was cloudy urine with strong odor, noticed blood in urine x 1. Burning with urination.

## 2011-08-06 ENCOUNTER — Telehealth: Payer: Self-pay

## 2011-08-06 NOTE — Telephone Encounter (Signed)
[facility] Triage Call Report Triage Record Num: [id number] Operator: Tarri Glenn Patient Name: Hailey [name] Call Date & Time: 08/04/2011 1:20:31PM Patient Phone: [phone] PCP: Sonda Primes Patient Gender: Female PCP Fax : [phone] Patient DOB: December 20, 1934 Practice Name: [facility] Reason for Call: Caller: Phebe/Patient; PCP: Sonda Primes; CB#: [phone]; Call Reason: Urinary Pain/Bleeding; Sx Onset: 08/04/2011; Sx Notes: ; Afebrile; Wt: ; Guideline Used: ; Disp:; Appt Scheduled?: Patient is calling about burining and blood with urination. Patient also has urgency. Afebrile. Onset 08/04/11. All emergent s/s r/o with exception to urinary tract symptoms and any flank, low back, lower abdominal or genital area pain per Bloody Urine protocol. Advised patient to go to ED/UC for evaluation, will go to [facility]. Protocol(s) Used: Bloody Urine Protocol(s) Used: Urinary Symptoms - Female Recommended Outcome per Protocol: See Provider within 4 hours Reason for Outcome: Blood in urine Urinary tract symptoms AND any flank, low back, lower abdominal or genital area (labia, vagina OR testicle/scrotum) pain Care Advice: Most adults need to drink 6-10 eight-ounce glasses (1.2-2.0 liters) of fluids per day unless previously told to limit fluid intake for other medical reasons. Limit fluids that contain caffeine, sugar or alcohol. Urine will be a very light yellow color when you drink enough fluids. ~ Analgesic/Antipyretic Advice - Acetaminophen: Consider acetaminophen as directed on label or by pharmacist/provider for pain or fever PRECAUTIONS: - Use if there is no history of liver disease, alcoholism, or intake of three or more alcohol drinks per day - Only if approved by provider during pregnancy or when breastfeeding - During pregnancy, acetaminophen should not be taken more than 3 consecutive days without telling provider - Do not exceed recommended dose or  frequency ~ 08/04/2011 1:31:06PM Page 1 of 1 [email or web address]

## 2011-08-07 ENCOUNTER — Ambulatory Visit (INDEPENDENT_AMBULATORY_CARE_PROVIDER_SITE_OTHER): Payer: Medicare Other | Admitting: Obstetrics and Gynecology

## 2011-08-07 DIAGNOSIS — R300 Dysuria: Secondary | ICD-10-CM

## 2011-08-07 DIAGNOSIS — R823 Hemoglobinuria: Secondary | ICD-10-CM

## 2011-08-07 MED ORDER — NITROFURANTOIN MONOHYD MACRO 100 MG PO CAPS: 100.0000 mg | ORAL_CAPSULE | Freq: Two times a day (BID) | ORAL | Status: AC

## 2011-08-07 NOTE — Progress Notes (Signed)
72 hours ago the patient started to have hematuria, dysuria, and inability to empty her bladder. She went to the urgent care center at [facility] and had an abnormal urinalysis. She was treated with Septra DS twice a day for UTI and was also given a Diflucan to take if she developed a yeast infection. All her urinary symptoms disappeared after 2 doses of the antibiotic but she is now getting extreme nausea from the antibiotic. She ended up taking 4 doses before she stopped it yesterday. Records from urgent care Center reviewed.  Urinalysis: Still abnormal today but much better than it was on Saturday.  Assessment: #1. UTI with hematuria #2. Reaction to Septra  Plan: Urine culture done. Patient will take no antibiotics until tomorrow. She will then start Macrobid twice a day with food for 5 days. She will then come back for followup urinalysis.

## 2011-10-02 ENCOUNTER — Ambulatory Visit (INDEPENDENT_AMBULATORY_CARE_PROVIDER_SITE_OTHER): Payer: Medicare Other | Admitting: Obstetrics and Gynecology

## 2011-10-02 ENCOUNTER — Ambulatory Visit (INDEPENDENT_AMBULATORY_CARE_PROVIDER_SITE_OTHER): Payer: Medicare Other | Admitting: Sports Medicine

## 2011-10-02 DIAGNOSIS — N898 Other specified noninflammatory disorders of vagina: Secondary | ICD-10-CM

## 2011-10-02 DIAGNOSIS — M6580 Other synovitis and tenosynovitis, unspecified site: Secondary | ICD-10-CM

## 2011-10-02 DIAGNOSIS — L293 Anogenital pruritus, unspecified: Secondary | ICD-10-CM

## 2011-10-02 DIAGNOSIS — M76899 Other specified enthesopathies of unspecified lower limb, excluding foot: Secondary | ICD-10-CM | POA: Insufficient documentation

## 2011-10-02 DIAGNOSIS — M25569 Pain in unspecified knee: Secondary | ICD-10-CM

## 2011-10-02 DIAGNOSIS — B373 Candidiasis of vulva and vagina: Secondary | ICD-10-CM

## 2011-10-02 DIAGNOSIS — B3731 Acute candidiasis of vulva and vagina: Secondary | ICD-10-CM

## 2011-10-02 DIAGNOSIS — M25561 Pain in right knee: Secondary | ICD-10-CM

## 2011-10-02 LAB — WET PREP FOR TRICH, YEAST, CLUE
Clue Cells Wet Prep HPF POC: NONE SEEN
Trich, Wet Prep: NONE SEEN
Yeast Wet Prep HPF POC: NONE SEEN

## 2011-10-02 MED ORDER — MELOXICAM 15 MG PO TABS: ORAL_TABLET | ORAL | Status: DC

## 2011-10-02 MED ORDER — TERCONAZOLE 0.8 % VA CREA: 1.0000 | TOPICAL_CREAM | Freq: Every day | VAGINAL | Status: AC

## 2011-10-02 NOTE — Progress Notes (Signed)
Patient came to see me today with vulvar itching. She used Mytrex cream last night and says she is better but she is going away for 3 weeks and 1 to be sure. She does have a history of lichen sclerosis. She is having no vaginal bleeding.  Pelvic exam: significant vulvitis. BUS within normal limits. Vaginal exam within normal limits.wet prep negative. Cervix and uterus surgically absent. Adnexa failed to reveal masses. Rectovaginal examination is confirmatory and without masses.   Assessment: #1 vulvitis #2 possible yeast vaginitis although wet prep negative  Plan: Continue Mytrex cream twice a day. Also called  patient in a prescription for terconazole 3 cream for when necessary use.

## 2011-10-02 NOTE — Progress Notes (Signed)
  Subjective:    Patient ID: Hailey [name], female    DOB: Dec 30, 1934, 77 y.o.   MRN: [id number]  HPI  Left knee pain: Injected approximately 4 months ago, relief lasted approximately the cement of time, she has no DJD as well as what appears to be a meniscal tear and ultrasound. She is requesting another injection  Left hip pain: Localized to the ASIS, she's been getting PRP injections at [facility], however this was done somewhat lower near the iliopsoas tendon. She's never undergone any type of physical therapy for her hip flexor nor has she been taking any anti-inflammatories for this.  Social history: Nonsmoker.  Review of Systems    No fevers, chills, night sweats, weight loss, chest pain, or shortness of breath.  Objective:   Physical Exam  General:  Well developed, well nourished, and in no acute distress. Neuro:  Alert and oriented x3, extra-ocular muscles intact. Skin: Warm and dry, no rashes noted. Respiratory:  Not using accessory muscles, speaking in full sentences.  Left hip is tender to palpation over the ASIS, she also has pain with resisted hip flexion and adduction maneuvers.  Real-time Ultrasound Guided Injection of: Right knee Ultrasound guided injection is preferred based on a randomized control clinical trial that shows increased duration, increased effect, greater accuracy, decreased procedural pain, increased response rate, and decreased cost with ultrasound guided versus blind injection. Consent obtained. Time-out conducted. Noted no overlying erythema, induration, or other signs of local infection. Skin prepped in a sterile fashion. Local anesthesia: None With sterile technique and under real time ultrasound guidance: The needle inserted into the suprapatellar recess under real time ultrasound guidance. Suprapatellar recess seen dilating during injection. Completed without difficulty Meds: 1 cc Depo-Medrol, 4 cc lidocaine . Needle: 25 g Pain immediately  resolved suggesting accurate placement of the medication. Advised to call if fevers/chills, erythema, induration, drainage, or persistent bleeding. Images saved.     Assessment & Plan:   Right knee pain: Injected as above, she may return to see [place] on an as-needed basis.  Left hip pain: Suggestive of hip flexor injury. I gave her the hip flexor rehabilitation exercises, she may also use meloxicam at home for pain. I would like to see her back in 3 weeks to reassess all of her symptoms.

## 2011-10-31 ENCOUNTER — Ambulatory Visit (INDEPENDENT_AMBULATORY_CARE_PROVIDER_SITE_OTHER): Payer: Medicare Other | Admitting: Sports Medicine

## 2011-10-31 ENCOUNTER — Encounter: Payer: Self-pay | Admitting: Sports Medicine

## 2011-10-31 ENCOUNTER — Other Ambulatory Visit: Payer: Self-pay | Admitting: *Deleted

## 2011-10-31 ENCOUNTER — Other Ambulatory Visit (INDEPENDENT_AMBULATORY_CARE_PROVIDER_SITE_OTHER): Payer: Medicare Other

## 2011-10-31 VITALS — BP 125/90 | HR 99

## 2011-10-31 DIAGNOSIS — Z79899 Other long term (current) drug therapy: Secondary | ICD-10-CM

## 2011-10-31 DIAGNOSIS — M76899 Other specified enthesopathies of unspecified lower limb, excluding foot: Secondary | ICD-10-CM

## 2011-10-31 DIAGNOSIS — Z0000 Encounter for general adult medical examination without abnormal findings: Secondary | ICD-10-CM

## 2011-10-31 DIAGNOSIS — M6580 Other synovitis and tenosynovitis, unspecified site: Secondary | ICD-10-CM

## 2011-10-31 LAB — LIPID PANEL
Cholesterol: 173 mg/dL (ref 0–200)
HDL: 89.7 mg/dL
LDL Cholesterol: 60 mg/dL (ref 0–99)
Total CHOL/HDL Ratio: 2
Triglycerides: 118 mg/dL (ref 0.0–149.0)
VLDL: 23.6 mg/dL (ref 0.0–40.0)

## 2011-10-31 LAB — URINALYSIS, ROUTINE W REFLEX MICROSCOPIC
Bilirubin Urine: NEGATIVE
Hgb urine dipstick: NEGATIVE
Ketones, ur: NEGATIVE
Leukocytes, UA: NEGATIVE
Nitrite: NEGATIVE
Specific Gravity, Urine: 1.02 (ref 1.000–1.030)
Total Protein, Urine: NEGATIVE
Urine Glucose: NEGATIVE
Urobilinogen, UA: 0.2 (ref 0.0–1.0)
pH: 6 (ref 5.0–8.0)

## 2011-10-31 LAB — BASIC METABOLIC PANEL WITH GFR
BUN: 26 mg/dL — ABNORMAL HIGH (ref 6–23)
CO2: 27 meq/L (ref 19–32)
Calcium: 9 mg/dL (ref 8.4–10.5)
Chloride: 100 meq/L (ref 96–112)
Creatinine, Ser: 1.3 mg/dL — ABNORMAL HIGH (ref 0.4–1.2)
GFR: 42.45 mL/min — ABNORMAL LOW
Glucose, Bld: 98 mg/dL (ref 70–99)
Potassium: 4.1 meq/L (ref 3.5–5.1)
Sodium: 136 meq/L (ref 135–145)

## 2011-10-31 LAB — HEPATIC FUNCTION PANEL
ALT: 20 U/L (ref 0–35)
AST: 28 U/L (ref 0–37)
Albumin: 3.8 g/dL (ref 3.5–5.2)
Alkaline Phosphatase: 59 U/L (ref 39–117)
Bilirubin, Direct: 0 mg/dL (ref 0.0–0.3)
Total Bilirubin: 0.5 mg/dL (ref 0.3–1.2)
Total Protein: 6.6 g/dL (ref 6.0–8.3)

## 2011-10-31 LAB — CBC WITH DIFFERENTIAL/PLATELET
Basophils Absolute: 0 K/uL (ref 0.0–0.1)
Basophils Relative: 0.5 % (ref 0.0–3.0)
Eosinophils Absolute: 0.5 K/uL (ref 0.0–0.7)
Eosinophils Relative: 6.9 % — ABNORMAL HIGH (ref 0.0–5.0)
HCT: 37.3 % (ref 36.0–46.0)
Hemoglobin: 12.7 g/dL (ref 12.0–15.0)
Lymphocytes Relative: 20.8 % (ref 12.0–46.0)
Lymphs Abs: 1.4 K/uL (ref 0.7–4.0)
MCHC: 34.1 g/dL (ref 30.0–36.0)
MCV: 90.6 fl (ref 78.0–100.0)
Monocytes Absolute: 0.7 K/uL (ref 0.1–1.0)
Monocytes Relative: 10.8 % (ref 3.0–12.0)
Neutro Abs: 4.1 K/uL (ref 1.4–7.7)
Neutrophils Relative %: 61 % (ref 43.0–77.0)
Platelets: 250 K/uL (ref 150.0–400.0)
RBC: 4.11 Mil/uL (ref 3.87–5.11)
RDW: 14.4 % (ref 11.5–14.6)
WBC: 6.8 K/uL (ref 4.5–10.5)

## 2011-10-31 LAB — TSH: TSH: 1.87 u[IU]/mL (ref 0.35–5.50)

## 2011-10-31 NOTE — Patient Instructions (Addendum)
Please work with trainer to find some exercises that work your abdominal muscles gently  Probably need to start with your back elevated 30 degrees- gradually lower this by 5 degrees   Try using an abdominal belt or compression shorts over low abdomen to wear when you are working out  Do isometric abdominal exercises on days you do not work with [employer] - tightening abdominal muscles for 5 seconds and repeat 10 times  Avoid standing side leg lifts  Only lift moderately light weights over head  Please follow up as needed  Thank you for seeing [place] today!

## 2011-10-31 NOTE — Assessment & Plan Note (Signed)
She has had chronic and recurrent pain in her left hip flexors but particularly in the lower abdominal oblique area just above the left hip  I think this is sometimes triggered by different activities she does in her exercise program  We modified her exercise program. Suggestion a series of abdominal exercises for rehabilitation. Use when necessary medications for pain as needed.  She will work on this over the next 4-6 weeks and we will recheck her if not improving.  I did fill out a form for her to cancel her trip because of that she can walk very effectively with pain in the left hip. Her right knee arthritis Will her to walk better but not to go up hills or steps very well.

## 2011-10-31 NOTE — Progress Notes (Signed)
  Subjective:    Patient ID: Hailey [name], female    DOB: 1935-01-03, 77 y.o.   MRN: [id number]  HPI  Pt presents to clinic for f/u of rt knee and lt hip pain.  Just got back from [place] in [place] for 3 weeks where she did a lot of walking.  Rt knee improved after injection, only has occasional twinges of pain now. Lt anterior hip- was better when she got to [place], but worsened as she worked out and walked more. She was unable to tolerate meloxicam- states it made her jittery and have HAs. Takes aleve and tramadol for pain as needed which is helpful. Worked with trainer Monday at the gym, had trouble walking due to lt hip pain Tuesday.     Review of Systems     Objective:   Physical Exam  NAD  Rt knee exam: No effusion Full flexion and extension Mcmurray's neg Crepitation under patella Good quad strength Ligaments stable  Hip exam: Hip flexion strength good bilat sartorius strength good bilat Good ROM on lt Hip flexion good  TTP over iliac crest anteriorly on lt, straight leg raise does not make bulge, but sitting up causes pain        Assessment & Plan:

## 2011-11-01 ENCOUNTER — Other Ambulatory Visit: Payer: Self-pay | Admitting: *Deleted

## 2011-11-01 MED ORDER — DULOXETINE HCL 60 MG PO CPEP: 60.0000 mg | ORAL_CAPSULE | Freq: Every day | ORAL | Status: DC

## 2011-11-07 ENCOUNTER — Ambulatory Visit (INDEPENDENT_AMBULATORY_CARE_PROVIDER_SITE_OTHER): Payer: Medicare Other | Admitting: Internal Medicine

## 2011-11-07 ENCOUNTER — Encounter: Payer: Self-pay | Admitting: Internal Medicine

## 2011-11-07 VITALS — BP 130/80 | HR 80 | Temp 96.8°F | Resp 16 | Wt 167.0 lb

## 2011-11-07 DIAGNOSIS — Z0000 Encounter for general adult medical examination without abnormal findings: Secondary | ICD-10-CM | POA: Insufficient documentation

## 2011-11-07 DIAGNOSIS — D681 Hereditary factor XI deficiency: Secondary | ICD-10-CM

## 2011-11-07 DIAGNOSIS — R42 Dizziness and giddiness: Secondary | ICD-10-CM | POA: Insufficient documentation

## 2011-11-07 DIAGNOSIS — F411 Generalized anxiety disorder: Secondary | ICD-10-CM

## 2011-11-07 DIAGNOSIS — M25559 Pain in unspecified hip: Secondary | ICD-10-CM

## 2011-11-07 DIAGNOSIS — I10 Essential (primary) hypertension: Secondary | ICD-10-CM

## 2011-11-07 MED ORDER — MECLIZINE HCL 12.5 MG PO TABS: 12.5000 mg | ORAL_TABLET | Freq: Three times a day (TID) | ORAL | Status: AC | PRN

## 2011-11-07 NOTE — Assessment & Plan Note (Signed)
Benign Positional Vertigo symptoms on the right Start Meclizine. Start Brandt - Daroff exercise several times a day as dirrected.  

## 2011-11-07 NOTE — Assessment & Plan Note (Signed)
Continue with current prescription therapy as reflected on the Med list.  

## 2011-11-07 NOTE — Assessment & Plan Note (Signed)
Chronic  Dr Darrick Penna

## 2011-11-07 NOTE — Assessment & Plan Note (Signed)
The patient is here for annual Medicare wellness examination and management of other chronic and acute problems.   The risk factors are reflected in the social history.  The roster of all physicians providing medical care to patient - is listed in the Snapshot section of the chart.  Activities of daily living:  The patient is 100% inedpendent in all ADLs: dressing, toileting, feeding as well as independent mobility  Home safety : The patient has smoke detectors in the home. They wear seatbelts.No firearms at home ( firearms are present in the home, kept in a safe fashion). There is no violence in the home.   There is no risks for hepatitis, STDs or HIV. There is no   history of blood transfusion. They have no travel history to infectious disease endemic areas of the world.  The patient has (has not) seen their dentist in the last six month. They have (not) seen their eye doctor in the last year. They deny (admit to) any hearing difficulty and have not had audiologic testing in the last year.  They do not  have excessive sun exposure. Discussed the need for sun protection: hats, long sleeves and use of sunscreen if there is significant sun exposure.   Diet: the importance of a healthy diet is discussed. They do have a healthy (unhealthy-high fat/fast food) diet.  The patient has a regular exercise program: ____1___ , __h__duration w/a trainer, _2___per week and exercise bike.  The benefits of regular aerobic exercise were discussed.  Depression screen: there are no signs or vegative symptoms of depression- irritability, change in appetite, anhedonia, sadness/tearfullness.  Cognitive assessment: the patient manages all their financial and personal affairs and is actively engaged. They could relate day,date,year and events; recalled 3/3 objects at 3 minutes; performed clock-face test normally.  The following portions of the patient's history were reviewed and updated as appropriate: allergies,  current medications, past family history, past medical history,  past surgical history, past social history  and problem list.  Vision, hearing, body mass index were assessed and reviewed.   During the course of the visit the patient was educated and counseled about appropriate screening and preventive services including : fall prevention , diabetes screening, nutrition counseling, colorectal cancer screening, and recommended immunizations.

## 2011-11-07 NOTE — Patient Instructions (Addendum)
Take Toprol 1/2 tab twice a day if BP is high (nl BP<130/85)  Benign Positional Vertigo symptoms on the right. Start Meclizine. Start Francee Piccolo - Daroff exercise several times a day as dirrected.

## 2011-11-07 NOTE — Progress Notes (Signed)
Patient ID: Hailey [name], female   DOB: 28-Dec-1934, 77 y.o.   MRN: [id number]  Subjective:    Patient ID: Hailey [name], female    DOB: [birth year], 77 y.o.   MRN: [id number]  HPI  The patient is here for a wellness exam. The patient has been doing well overall without major physical or psychological issues going on lately.  The patient needs to address  chronic hypertension that has been well controlled with medicine  F/u bruising - better No elev. BP lately F/u hip pain. C/o HAs sometimes  Review of Systems  Constitutional: Negative for appetite change and fatigue.  HENT: Negative for neck pain and tinnitus.   Eyes: Negative for pain.  Respiratory: Negative for chest tightness and shortness of breath.   Cardiovascular: Negative for chest pain.  Musculoskeletal: Negative for back pain.  Neurological: Negative for dizziness, speech difficulty and weakness.  Hematological: Negative for adenopathy. Bruises/bleeds easily.  Psychiatric/Behavioral: Negative for behavioral problems and dysphoric mood.   Wt Readings from Last 3 Encounters:  11/07/11 167 lb (75.751 kg)  07/25/11 158 lb (71.668 kg)  05/30/11 157 lb (71.215 kg)       BP Readings from Last 3 Encounters:  11/07/11 130/80  10/31/11 125/90  10/02/11 126/76    Objective:   Physical Exam  Constitutional: She appears well-developed and well-nourished. No distress.  HENT:  Head: Normocephalic.  Right Ear: External ear normal.  Left Ear: External ear normal.  Nose: Nose normal.  Mouth/Throat: Oropharynx is clear and moist.  Eyes: Conjunctivae are normal. Pupils are equal, round, and reactive to light. Right eye exhibits no discharge. Left eye exhibits no discharge.  Neck: Normal range of motion. Neck supple. No JVD present. No tracheal deviation present. No thyromegaly present.  Cardiovascular: Normal rate, regular rhythm and normal heart sounds.   Pulmonary/Chest: No stridor. No respiratory distress. She has no  wheezes.  Abdominal: Soft. Bowel sounds are normal. She exhibits no distension and no mass. There is no tenderness. There is no rebound and no guarding.  Musculoskeletal: She exhibits no edema and no tenderness.  Lymphadenopathy:    She has no cervical adenopathy.  Neurological: She displays normal reflexes. No cranial nerve deficit. She exhibits normal muscle tone. Coordination normal.  Skin: No rash noted. No erythema.       Tanned.   Psychiatric: She has a normal mood and affect. Her behavior is normal. Judgment and thought content normal.  H-P (+) on R     Lab Results  Component Value Date   WBC 6.8 10/31/2011   HGB 12.7 10/31/2011   HCT 37.3 10/31/2011   PLT 250.0 10/31/2011   GLUCOSE 98 10/31/2011   CHOL 173 10/31/2011   TRIG 118.0 10/31/2011   HDL 89.70 10/31/2011   LDLCALC 60 10/31/2011   ALT 20 10/31/2011   AST 28 10/31/2011   NA 136 10/31/2011   K 4.1 10/31/2011   CL 100 10/31/2011   CREATININE 1.3* 10/31/2011   BUN 26* 10/31/2011   CO2 27 10/31/2011   TSH 1.87 10/31/2011   INR 1.0 01/26/2011       Assessment & Plan:

## 2011-11-07 NOTE — Assessment & Plan Note (Signed)
Mild sx's 

## 2011-11-28 ENCOUNTER — Telehealth: Payer: Self-pay | Admitting: Internal Medicine

## 2011-11-28 MED ORDER — LORAZEPAM 1 MG PO TABS: 1.0000 mg | ORAL_TABLET | Freq: Two times a day (BID) | ORAL | Status: DC | PRN

## 2011-11-28 NOTE — Telephone Encounter (Signed)
Needs Gertie Gowda ref -- Stacey pls phone in a new rx [employer] [facility]

## 2011-11-29 NOTE — Telephone Encounter (Signed)
Refill left on pharmacy voicemail per 11/28/11 documentation, #180 x 1 refill.

## 2011-12-10 DIAGNOSIS — M8580 Other specified disorders of bone density and structure, unspecified site: Secondary | ICD-10-CM

## 2011-12-10 HISTORY — DX: Other specified disorders of bone density and structure, unspecified site: M85.80

## 2011-12-14 ENCOUNTER — Encounter: Payer: Self-pay | Admitting: Obstetrics and Gynecology

## 2011-12-14 ENCOUNTER — Ambulatory Visit (INDEPENDENT_AMBULATORY_CARE_PROVIDER_SITE_OTHER): Payer: Medicare Other | Admitting: Obstetrics and Gynecology

## 2011-12-14 VITALS — BP 126/80 | Ht 64.0 in | Wt 165.0 lb

## 2011-12-14 DIAGNOSIS — N904 Leukoplakia of vulva: Secondary | ICD-10-CM

## 2011-12-14 DIAGNOSIS — M899 Disorder of bone, unspecified: Secondary | ICD-10-CM

## 2011-12-14 DIAGNOSIS — L290 Pruritus ani: Secondary | ICD-10-CM

## 2011-12-14 DIAGNOSIS — Z780 Asymptomatic menopausal state: Secondary | ICD-10-CM

## 2011-12-14 DIAGNOSIS — N951 Menopausal and female climacteric states: Secondary | ICD-10-CM

## 2011-12-14 DIAGNOSIS — M8580 Other specified disorders of bone density and structure, unspecified site: Secondary | ICD-10-CM

## 2011-12-14 MED ORDER — HYDROCORTISONE ACE-PRAMOXINE 2.5-1 % RE CREA: TOPICAL_CREAM | Freq: Three times a day (TID) | RECTAL | Status: AC

## 2011-12-14 MED ORDER — ESTRADIOL 0.05 MG/24HR TD PTWK: 1.0000 | MEDICATED_PATCH | TRANSDERMAL | Status: DC

## 2011-12-14 NOTE — Progress Notes (Signed)
Patient came back to see me today for further followup. She remains on her estrogen patch with relief of menopausal symptoms. She is having no vaginal bleeding. She is having no pelvic pain. She is up-to-date on mammograms. She is due for followup bone density with  Osteopenia. She was doing them at [facility] radiology. She is having perianal itching today. Usually it responds to Preparation H. Currently it is not. She has recurrent symptoms from lichen sclerosis and yeast vaginitis but is currently asymptomatic. She is having no dysuria, urgency or incontinence.  ROS: 12 system review done. Pertinent positives above. Other positives include rhinitis, hypertension, GERD, anxiety,degenerative disc disease, headaches, and vertigo.  HEENT: Within normal limits. Kennon Portela present. Neck: No masses. Supraclavicular lymph nodes: Not enlarged. Breasts: Examined in both sitting and lying position. Symmetrical without skin changes or masses. Abdomen: Soft no masses guarding or rebound. No hernias. Pelvic: leukoplakia of perineum and inner labia which is stable. BUS within normal limits. Vaginal examination shows good estrogen effect, no cystocele enterocele or rectocele. Cervix and uterus absent. Adnexa within normal limits. Rectovaginal confirmatory with perianal excoriation. Extremities within normal limits.  Assessment: #1. Menopausal symptoms #2. Perianal itching #3. Osteopenia #4. Lichen sclerosis  Plan:continue [place] patch. Analpram-HC to  perianal area. Continue yearly mammograms. Patient to check for timing of last bone density. If it is been over 2 years she will call here and we will do her bone density here.

## 2011-12-24 ENCOUNTER — Telehealth: Payer: Self-pay | Admitting: *Deleted

## 2011-12-24 NOTE — Telephone Encounter (Signed)
Pt was told to check when last bone density was done and it was done sept. 2009 and told to call and let you know this. Pt has scheduled bone density here on April 30 th at 11:00 am

## 2012-01-03 ENCOUNTER — Other Ambulatory Visit: Payer: Self-pay | Admitting: Obstetrics and Gynecology

## 2012-01-03 DIAGNOSIS — M8580 Other specified disorders of bone density and structure, unspecified site: Secondary | ICD-10-CM

## 2012-01-08 ENCOUNTER — Ambulatory Visit (INDEPENDENT_AMBULATORY_CARE_PROVIDER_SITE_OTHER): Payer: Medicare Other

## 2012-01-08 DIAGNOSIS — M899 Disorder of bone, unspecified: Secondary | ICD-10-CM

## 2012-01-08 DIAGNOSIS — M8580 Other specified disorders of bone density and structure, unspecified site: Secondary | ICD-10-CM

## 2012-02-13 ENCOUNTER — Ambulatory Visit (INDEPENDENT_AMBULATORY_CARE_PROVIDER_SITE_OTHER): Payer: Medicare Other | Admitting: Sports Medicine

## 2012-02-13 VITALS — BP 128/80

## 2012-02-13 DIAGNOSIS — IMO0002 Reserved for concepts with insufficient information to code with codable children: Secondary | ICD-10-CM

## 2012-02-13 DIAGNOSIS — M25561 Pain in right knee: Secondary | ICD-10-CM

## 2012-02-13 DIAGNOSIS — M25569 Pain in unspecified knee: Secondary | ICD-10-CM

## 2012-02-13 NOTE — Assessment & Plan Note (Signed)
Most likely due to flare of arthritis.  Exam wnl today.  Pt to take aleve 1 tab bid x 3-5 days if right knee pain reoccurs.  If not improved with aleve can return for recheck and possible injection.

## 2012-02-13 NOTE — Assessment & Plan Note (Signed)
Strain of abdominal oblique muscle is most likely cause.  Pt to do abd exercises for strengthening.  Pt to return if no improvement or if new or worsening of symptoms.

## 2012-02-13 NOTE — Progress Notes (Signed)
  Subjective:    Patient ID: Hailey [name], female    DOB: [birth year], 77 y.o.   MRN: [id number]  HPI Right knee pain: Has h/o arthritis in right knee.  Recently at beach and walked on sand and has had increased pain off and on in right knee since the beach trip.  No redness. No swelling.  No popping or clicking.  Walking and movement makes it worse.  Rest makes it better. Uses otc aleve prn for pain.   Left hip area pain: Has been off and on during the past 1-2 weeks. Pt points to lateral left lower quadrant- when asked to place hand on site of pain.   Seems to be brought on by walking in sand at the beach also can  Be provoked by carrying objects or extra weight in arms.  Rest and aleve give some relief.  Pain also can occur if she uses eliptical for longer than 25 min.  No redness. No fever. No rash on hip.    Review of Systems As per above.     Objective:   Physical Exam  Constitutional: She appears well-developed and well-nourished.  Cardiovascular: Normal rate.   Pulmonary/Chest: Effort normal. No respiratory distress.  Musculoskeletal:       Right knee pain: ligaments intake and stable with normal endpoints. Normal strength in right lower leg.  Normal sensation. No swelling or redness of joint.  No pain on palpation of right knee joint.  Flexion of 140 degrees, able to extend knee completely.   Left hip exam: No pain with rom of left hip.  Full rom present.  Normal hip muscle strength. Normal sensation. No swelling or redness of joint.  No pain on palpation.  + mild tenderness on deep palpation of left lower abdominal oblique muscle            Assessment & Plan:

## 2012-02-24 ENCOUNTER — Other Ambulatory Visit: Payer: Self-pay | Admitting: Internal Medicine

## 2012-04-27 ENCOUNTER — Other Ambulatory Visit: Payer: Self-pay | Admitting: Internal Medicine

## 2012-05-21 ENCOUNTER — Ambulatory Visit (INDEPENDENT_AMBULATORY_CARE_PROVIDER_SITE_OTHER): Payer: Medicare Other | Admitting: Sports Medicine

## 2012-05-21 VITALS — BP 148/88 | Ht 64.0 in | Wt 160.0 lb

## 2012-05-21 DIAGNOSIS — M7062 Trochanteric bursitis, left hip: Secondary | ICD-10-CM | POA: Insufficient documentation

## 2012-05-21 DIAGNOSIS — M76899 Other specified enthesopathies of unspecified lower limb, excluding foot: Secondary | ICD-10-CM

## 2012-05-21 DIAGNOSIS — M7061 Trochanteric bursitis, right hip: Secondary | ICD-10-CM | POA: Insufficient documentation

## 2012-05-21 NOTE — Progress Notes (Signed)
Chief complaint: Patient is coming in for continued left hip pain as well as new onset right hip pain.  Patient was seen back in June for her left hip pain. Patient states it is still worse with certain movements especially with walking long periods of time. Patient denies any radiation of pain. Patient states that most of the pain is on the lateral aspect of the hip. Patient denies any pain with sitting but states that when she tries to stand or goes upstairs she has more pain. Patient had been taking Aleve on a regular basis but did not notice any improvement. Patient has tried ice if no improvement either.  Patient is now having similar complaints in the right hip. Patient states that this seemed to be exacerbated after her right knee injury as well as walking on the beach recently. Patient states that it's on the lateral aspect of the leg and decide does radiate down her leg. Patient states that the radiation states on the lateral aspect of the leg and denies any back pain out of the ordinary. Patient denies any weakness or numbness of the lower extremity but once again has significant pain with walking long distances or going upstairs.  Review of systems: As stated above in history of present illness Past medical surgical family and social history reviewed without any changes.  Physical exam Filed Vitals:   05/21/12 1156  BP: 148/88   general: No apparent distress alert and oriented x3 mood and affect normal Hip: bilateral ROM IR: 30 Deg, ER: 80 Deg, Flexion: 120 Deg, Extension: 100 Deg, Abduction: 45 Deg, Adduction: 35 Deg Strength IR: 5/5, ER: 5/5, Flexion: 5/5, Extension: 5/5, Abduction: 3/5, Adduction: 5/5 Pelvic alignment unremarkable to inspection and palpation. Standing hip rotation and gait without trendelenburg / unsteadiness. Greater trochanter very tender to palpation bilaterally. No tenderness over piriformis and greater trochanter. No SI joint tenderness and normal minimal SI  movement.  After verbal and written consent patient was prepped with Betadine and alcohol swab over greater trochanteric bursitis is bilaterally. Patient then was injected with 3 cc of 1% lidocaine and 1 cc of Depo-Medrol 40 mg into each greater trochanteric bursitis. Patient did tolerate the procedure well initially and was placed with a Band-Aid over each side.  Patient unfortunately did have numbness of the left leg only down to the foot. Patient is able to move leg completely but had trouble walking secondary to the numbness that was felt. Patient was able to fetal light touch and sensation as well as once again full motor of the lower externally.

## 2012-05-21 NOTE — Assessment & Plan Note (Signed)
Patient did have her hips injected today. Patient unfortunately did have numbness of the left leg after procedure. Patient did have a bursectomy on the left side. Patient likely had unfortunate sinus tract or cavity that allowed for some of the medicine to track to the sciatic nerve. Patient did sit in office for approximately 35 minutes. Patient states that she did not have any pain. Patient did have her husband come and pick her up. Patient states at the time of leaving she is starting to have some sensation and feeling coming back to the left leg. Told patient that likely this will wear off in the next 4 hours for sure. If this did prolong longer than that the please give our office call.  Patient given exercises and stretches to do for the next 3 weeks. Patient has a goal of fainting at her granddaughter's wedding. Patient will followup after the sweating and at that time we will remove to level II which will encourage more with hip abductor strength and other strengthening exercises. Patient can continue to use ice as needed as well as oral anti-inflammatories.

## 2012-05-22 ENCOUNTER — Telehealth: Payer: Self-pay | Admitting: Family Medicine

## 2012-05-22 NOTE — Telephone Encounter (Signed)
Patient was called, and is doing much better since injection.  Patient states that it had worn off after 40 minutes. Patient has been able to walk and is feeling better overall.

## 2012-05-27 ENCOUNTER — Telehealth: Payer: Self-pay | Admitting: Family Medicine

## 2012-05-27 MED ORDER — PREDNISONE 20 MG PO TABS: 40.0000 mg | ORAL_TABLET | Freq: Every day | ORAL | Status: DC

## 2012-05-27 NOTE — Telephone Encounter (Signed)
The patient called stating that she is having some worsening pain on the right side. Patient states it helped happens mostly at her right hip and is radiating down to her right anterior shin. Patient denies any numbness or weakness but is having trouble tolerating leg. Patient has a history of low back pain is having some of the pain. Patient has a history of having a bulging disc previously but did not give her any radiculopathy previously. Patient though states that it does seem to be improving but very very slowly. I am concerned that patient is having a corticosteroid flare at this time. Patient is going to do an extended course of prednisone for another 5 days. This would help either the corticosteroid flare or the back pain. Patient was given red flags and when to seek medical attention. Patient does not feel better in 72 hours she will come back for reevaluation this week.

## 2012-06-04 ENCOUNTER — Ambulatory Visit (INDEPENDENT_AMBULATORY_CARE_PROVIDER_SITE_OTHER): Payer: Medicare Other | Admitting: Sports Medicine

## 2012-06-04 VITALS — BP 120/80 | Ht 64.0 in | Wt 160.0 lb

## 2012-06-04 DIAGNOSIS — M25559 Pain in unspecified hip: Secondary | ICD-10-CM

## 2012-06-04 NOTE — Progress Notes (Signed)
Subjective: Patient is here for followup of her bilateral greater trochanteric bursitis. Patient had injections previously and had resolving of the pain on the left side but has continued problems on the right side. Patient states that she has pain especially when she steps up on a stair with the right leg first. Patient states that it does seem to radiate from her hip down the lateral thigh and some in the posterior thigh. Patient states it is not across her knee. Patient denies any back pain at this time but does have a history of herniated disc previously. Patient states that this pain in the right side is no better than it was before the injection.  Patient had also done a short burst of prednisone which did not seem to improve it and then first day after the medication she had what appears to be in acute adrenal gland insufficiency but has improved today. Patient denies any fevers or chills denies any weakness in the extremity but states significant pain whenever she tries to step up a step on the right side.  Review of systems as stated above in history of present illness  Physical exam Filed Vitals:   06/04/12 0838  BP: 120/80   general: No apparent distress alert and oriented x3 mood and affect normal Hip: bilateral ROM IR: 30 Deg, ER: 80 Deg, Flexion: 120 Deg, Extension: 100 Deg, Abduction: 45 Deg, Adduction: 35 Deg Strength IR: 5/5, ER: 5/5, Flexion: 5/5, Extension: 5/5, Abduction: 3/5, Adduction: 5/5 Pelvic alignment unremarkable to inspection and palpation. Standing hip rotation and gait without trendelenburg / unsteadiness. Greater trochanter and IT band on right side still tender to palpation. No tenderness over piriformis and greater trochanter. No SI joint tenderness and normal minimal SI movement. Back exam: Patient is nontender on palpation of the lumbar spine. Patient has a negative straight leg test bilaterally. Patient has a positive Pearlean Brownie test of the right but likely  secondary to tightness. Patient does have positive stork test on the right side. Patient also states she has some minimal pain as well as the sensation in her hip and leg when she stands only on her right leg. Neurovascularly intact distally with 2+ DTRs. Patient has no clonus.

## 2012-06-04 NOTE — Assessment & Plan Note (Addendum)
Continues to have right-sided hip pain. This hip pain no does not appear to be specifically greater trochanteric bursitis or iliotibial band-type syndrome. Concern more that patient may be having radiculopathy from her low back. Patient does have a negative straight leg test. Patient does have a history of a protruding disc at L1-L2 on the left side back in 2010 that was seen on MRI. Discussed treatment with patient at length today. Patient is going to continue the anti-inflammatories that is helping somewhat, as well as starting tramadol half tab up to 3 times a day as needed. Patient is also going to be following up with Dr. Newell Coral today. I will forward this note to him so he knows what we are thinking. We think there is a potential for patient having radicular symptoms down the right leg with the distribution of approximately L4-L5 L5-S1. Potentially patient needs further imaging such as x-rays of the lumbar spine as potential MRI if he is concerned. If Dr. Newell Coral feels that this is not her back, she will return to [place] and we'll do further evaluation.

## 2012-06-05 ENCOUNTER — Other Ambulatory Visit: Payer: Self-pay | Admitting: *Deleted

## 2012-06-05 DIAGNOSIS — M5430 Sciatica, unspecified side: Secondary | ICD-10-CM

## 2012-06-05 MED ORDER — HYDROCODONE-ACETAMINOPHEN 5-325 MG PO TABS: 1.0000 | ORAL_TABLET | Freq: Four times a day (QID) | ORAL | Status: DC | PRN

## 2012-06-10 ENCOUNTER — Ambulatory Visit
Admission: RE | Admit: 2012-06-10 | Discharge: 2012-06-10 | Disposition: A | Discharge status: Home / Self Care | Payer: Medicare Other | Source: Ambulatory Visit | Attending: Sports Medicine | Admitting: Sports Medicine

## 2012-06-10 DIAGNOSIS — M5430 Sciatica, unspecified side: Secondary | ICD-10-CM

## 2012-06-12 ENCOUNTER — Other Ambulatory Visit: Payer: Medicare Other

## 2012-06-17 ENCOUNTER — Other Ambulatory Visit: Payer: Self-pay | Admitting: Neurosurgery

## 2012-06-17 DIAGNOSIS — M4120 Other idiopathic scoliosis, site unspecified: Secondary | ICD-10-CM

## 2012-06-17 DIAGNOSIS — M5137 Other intervertebral disc degeneration, lumbosacral region: Secondary | ICD-10-CM

## 2012-06-17 DIAGNOSIS — M48061 Spinal stenosis, lumbar region without neurogenic claudication: Secondary | ICD-10-CM

## 2012-06-17 DIAGNOSIS — M47817 Spondylosis without myelopathy or radiculopathy, lumbosacral region: Secondary | ICD-10-CM

## 2012-06-18 ENCOUNTER — Other Ambulatory Visit: Payer: Self-pay | Admitting: Obstetrics and Gynecology

## 2012-06-18 DIAGNOSIS — Z1231 Encounter for screening mammogram for malignant neoplasm of breast: Secondary | ICD-10-CM

## 2012-07-14 ENCOUNTER — Ambulatory Visit
Admission: RE | Admit: 2012-07-14 | Discharge: 2012-07-14 | Disposition: A | Discharge status: Home / Self Care | Payer: Medicare Other | Source: Ambulatory Visit | Attending: Obstetrics and Gynecology | Admitting: Obstetrics and Gynecology

## 2012-07-14 DIAGNOSIS — Z1231 Encounter for screening mammogram for malignant neoplasm of breast: Secondary | ICD-10-CM

## 2012-07-16 ENCOUNTER — Ambulatory Visit (INDEPENDENT_AMBULATORY_CARE_PROVIDER_SITE_OTHER): Payer: Medicare Other | Admitting: Women's Health

## 2012-07-16 ENCOUNTER — Encounter: Payer: Self-pay | Admitting: Women's Health

## 2012-07-16 DIAGNOSIS — B9689 Other specified bacterial agents as the cause of diseases classified elsewhere: Secondary | ICD-10-CM

## 2012-07-16 DIAGNOSIS — A499 Bacterial infection, unspecified: Secondary | ICD-10-CM

## 2012-07-16 DIAGNOSIS — N899 Noninflammatory disorder of vagina, unspecified: Secondary | ICD-10-CM

## 2012-07-16 DIAGNOSIS — N760 Acute vaginitis: Secondary | ICD-10-CM

## 2012-07-16 DIAGNOSIS — N898 Other specified noninflammatory disorders of vagina: Secondary | ICD-10-CM

## 2012-07-16 DIAGNOSIS — R350 Frequency of micturition: Secondary | ICD-10-CM

## 2012-07-16 LAB — URINALYSIS W MICROSCOPIC + REFLEX CULTURE
Bilirubin Urine: NEGATIVE
Casts: NONE SEEN
Crystals: NONE SEEN
Glucose, UA: NEGATIVE mg/dL
Hgb urine dipstick: NEGATIVE
Ketones, ur: 15 mg/dL — AB
Leukocytes, UA: NEGATIVE
Nitrite: POSITIVE — AB
Protein, ur: NEGATIVE mg/dL
Specific Gravity, Urine: 1.02 (ref 1.005–1.030)
Urobilinogen, UA: 0.2 mg/dL (ref 0.0–1.0)
pH: 5.5 (ref 5.0–8.0)

## 2012-07-16 LAB — WET PREP FOR TRICH, YEAST, CLUE
Trich, Wet Prep: NONE SEEN
Yeast Wet Prep HPF POC: NONE SEEN

## 2012-07-16 MED ORDER — METRONIDAZOLE 0.75 % VA GEL: VAGINAL | Status: DC

## 2012-07-16 NOTE — Progress Notes (Signed)
Patient ID: Hailey [name], female   DOB: [birth year], 77 y.o.   MRN: [id number] Presents with complaint of external vaginal burning especially with urination. Denies increased frequency, urgency, fever, or pain at end of stream. Has used nystatin cream and cortisone cream without relief. Hysterectomy.  Exam: Appears well, external genitalia extremely erythematous and excoriated from mons pubis to rectum. Wet prep done with a Q-tip, positive for amines, clue cells, TNTC bacteria. UA: Trace ketones, positive nitrites.  BV Perineal skin irritation  Plan: MetroGel vaginal cream 1 applicator at bedtime x5. Calmoseptine ointment samples given and instructed to apply twice daily, wear loose clothes, and call if burning sensation does not resolve. Urine culture pending.

## 2012-07-18 ENCOUNTER — Other Ambulatory Visit: Payer: Self-pay | Admitting: Women's Health

## 2012-07-18 DIAGNOSIS — N390 Urinary tract infection, site not specified: Secondary | ICD-10-CM

## 2012-07-18 LAB — URINE CULTURE: Colony Count: >=100000

## 2012-07-18 MED ORDER — CIPROFLOXACIN HCL 250 MG PO TABS: 250.0000 mg | ORAL_TABLET | Freq: Two times a day (BID) | ORAL | Status: DC

## 2012-08-05 ENCOUNTER — Ambulatory Visit (INDEPENDENT_AMBULATORY_CARE_PROVIDER_SITE_OTHER): Payer: Medicare Other | Admitting: Obstetrics and Gynecology

## 2012-08-05 DIAGNOSIS — R300 Dysuria: Secondary | ICD-10-CM

## 2012-08-05 DIAGNOSIS — N898 Other specified noninflammatory disorders of vagina: Secondary | ICD-10-CM

## 2012-08-05 DIAGNOSIS — N899 Noninflammatory disorder of vagina, unspecified: Secondary | ICD-10-CM

## 2012-08-05 LAB — WET PREP FOR TRICH, YEAST, CLUE
Clue Cells Wet Prep HPF POC: NONE SEEN
Trich, Wet Prep: NONE SEEN
Yeast Wet Prep HPF POC: NONE SEEN

## 2012-08-05 MED ORDER — TERCONAZOLE 0.8 % VA CREA: 1.0000 | TOPICAL_CREAM | Freq: Every day | VAGINAL | Status: DC

## 2012-08-05 MED ORDER — NITROFURANTOIN MONOHYD MACRO 100 MG PO CAPS: 100.0000 mg | ORAL_CAPSULE | Freq: Two times a day (BID) | ORAL | Status: DC

## 2012-08-05 NOTE — Progress Notes (Signed)
Patient came back today because of a recurrence of symptoms. She was treated by Maryelizabeth Rowan with MetroGel vaginal cream and Cipro for 3 days for both bacterial vaginosis and a urinary tract infection with greater than 100,000 Escherichia coli. After taking her medication all her symptoms disappeared but she is now having both vulvar itching and dysuria again. She could not give [place] a sample today.  Exam: Kennon Portela present. External shows 4+ vulvitis. BUS within normal limits. Vaginal exam normal with wet prep negative.  Assessment: #1. Persistent urinary tract infection #2 yeast vulvitis  Plan: I believe 3 days of Cipro was not enough. We treated day with Macrobid twice a day with food for 7 days. She was also treated with terconazole cream externally twice a day. She'll return in one week for urine culture. She will use the terconazole  internally if symptomatic after one week of antibiotics.

## 2012-08-05 NOTE — Patient Instructions (Signed)
Return in one week for urinalysis

## 2012-08-12 ENCOUNTER — Other Ambulatory Visit: Payer: Self-pay | Admitting: *Deleted

## 2012-08-12 ENCOUNTER — Other Ambulatory Visit: Payer: Medicare Other

## 2012-08-12 DIAGNOSIS — N390 Urinary tract infection, site not specified: Secondary | ICD-10-CM

## 2012-08-13 LAB — URINALYSIS W MICROSCOPIC + REFLEX CULTURE
Bacteria, UA: NONE SEEN
Casts: NONE SEEN
Crystals: NONE SEEN
Glucose, UA: NEGATIVE mg/dL
Hgb urine dipstick: NEGATIVE
Leukocytes, UA: NEGATIVE
Nitrite: NEGATIVE
Protein, ur: NEGATIVE mg/dL
Specific Gravity, Urine: 1.022 (ref 1.005–1.030)
Squamous Epithelial / LPF: NONE SEEN
Urobilinogen, UA: 1 mg/dL (ref 0.0–1.0)
pH: 7 (ref 5.0–8.0)

## 2012-08-18 ENCOUNTER — Telehealth: Payer: Self-pay | Admitting: *Deleted

## 2012-08-18 MED ORDER — CLOBETASOL PROPIONATE 0.05 % EX OINT: TOPICAL_OINTMENT | Freq: Two times a day (BID) | CUTANEOUS | Status: DC

## 2012-08-18 NOTE — Telephone Encounter (Signed)
Pt completed the Terazol 3 day cream, pt has some externally vaginal itching now, none internally. Pt said that she use some Terazol cream externally and it did help, but has now returned. She asked me to relay this information to you. Please advise

## 2012-08-18 NOTE — Addendum Note (Signed)
Addended by: Aura Camps on: 08/18/2012 12:24 PM   Modules accepted: Orders

## 2012-08-18 NOTE — Telephone Encounter (Signed)
Pt informed, rx sent 

## 2012-08-18 NOTE — Telephone Encounter (Signed)
Try temovate ointment two times daily on outside. I believe she has some. The generic is clobesterol. If not call in

## 2012-08-25 ENCOUNTER — Telehealth: Payer: Self-pay | Admitting: *Deleted

## 2012-08-25 NOTE — Telephone Encounter (Signed)
OK to fill this prescription with additional refills x6 Thank you!

## 2012-08-25 NOTE — Telephone Encounter (Signed)
Rf req for Lorazepam 1 mg. Last filled 04/01/12. Ok to Rf?

## 2012-08-26 MED ORDER — LORAZEPAM 1 MG PO TABS: 1.0000 mg | ORAL_TABLET | Freq: Two times a day (BID) | ORAL | Status: DC | PRN

## 2012-08-26 NOTE — Telephone Encounter (Signed)
Done

## 2012-10-03 ENCOUNTER — Ambulatory Visit (INDEPENDENT_AMBULATORY_CARE_PROVIDER_SITE_OTHER): Payer: Medicare Other | Admitting: Gynecology

## 2012-10-03 ENCOUNTER — Encounter: Payer: Self-pay | Admitting: Gynecology

## 2012-10-03 DIAGNOSIS — N898 Other specified noninflammatory disorders of vagina: Secondary | ICD-10-CM

## 2012-10-03 DIAGNOSIS — N760 Acute vaginitis: Secondary | ICD-10-CM

## 2012-10-03 DIAGNOSIS — B9689 Other specified bacterial agents as the cause of diseases classified elsewhere: Secondary | ICD-10-CM

## 2012-10-03 DIAGNOSIS — R8290 Unspecified abnormal findings in urine: Secondary | ICD-10-CM

## 2012-10-03 DIAGNOSIS — N949 Unspecified condition associated with female genital organs and menstrual cycle: Secondary | ICD-10-CM

## 2012-10-03 DIAGNOSIS — R82998 Other abnormal findings in urine: Secondary | ICD-10-CM

## 2012-10-03 DIAGNOSIS — A499 Bacterial infection, unspecified: Secondary | ICD-10-CM

## 2012-10-03 LAB — URINALYSIS W MICROSCOPIC + REFLEX CULTURE
Bilirubin Urine: NEGATIVE
Glucose, UA: NEGATIVE mg/dL
Hgb urine dipstick: NEGATIVE
Ketones, ur: NEGATIVE mg/dL
Leukocytes, UA: NEGATIVE
Nitrite: NEGATIVE
Protein, ur: NEGATIVE mg/dL
Specific Gravity, Urine: 1.025 (ref 1.005–1.030)
Urobilinogen, UA: 0.2 mg/dL (ref 0.0–1.0)
pH: 5.5 (ref 5.0–8.0)

## 2012-10-03 LAB — WET PREP FOR TRICH, YEAST, CLUE
Trich, Wet Prep: NONE SEEN
Yeast Wet Prep HPF POC: NONE SEEN

## 2012-10-03 MED ORDER — FLUCONAZOLE 150 MG PO TABS: 150.0000 mg | ORAL_TABLET | Freq: Once | ORAL | Status: DC

## 2012-10-03 MED ORDER — CLINDAMYCIN PHOSPHATE 2 % VA CREA: 1.0000 | TOPICAL_CREAM | Freq: Every day | VAGINAL | Status: DC

## 2012-10-03 NOTE — Patient Instructions (Signed)
Use Cleocin vaginal cream nightly x1 week 4 vaginal bacterial infection Take Diflucan pill x1 for yeast. Take extra Diflucan pill with you on your trip to have available if you would develop symptoms.

## 2012-10-03 NOTE — Progress Notes (Signed)
Patient presents with several days of vaginal odor and slight discharge. No itching.  Was treated in November initially with MetroGel and Cipro for Bactroban stenosis and UTI. Follow up was treated with Macrobid and Terazol cream for resistant UTI and yeast. Symptoms cleared and these new symptoms have started several days ago.  Exam was kim assistant Pelvic external BUS vagina with atrophic changes. Symmetrical white skin changes periclitoral along both labia into the perineal body with loss of skin folds consistent with lichen sclerosus.  Vagina with white discharge. Bimanual without masses or tenderness.  Assessment and plan: Exam consistent with lichen sclerosus. Review of her chart shows several biopsies which showed lichenoid vulvitis but no true lichen sclerosis. She is being treated with Temovate cream intermittently. Today symptoms and wet prep consistent with bacterial vaginosis. We'll treat with Cleocin vaginal cream nightly x7 days. I did offer oral she has a sensitive stomach I think a vaginal cream would be better. I also covered with Diflucan 150 mg tablet that she does have a history of yeast. One extra tablet given issues traveling out of country next week.

## 2012-10-08 ENCOUNTER — Other Ambulatory Visit: Payer: Self-pay | Admitting: Internal Medicine

## 2012-11-10 ENCOUNTER — Ambulatory Visit (INDEPENDENT_AMBULATORY_CARE_PROVIDER_SITE_OTHER): Payer: Medicare Other | Admitting: Internal Medicine

## 2012-11-10 ENCOUNTER — Other Ambulatory Visit (INDEPENDENT_AMBULATORY_CARE_PROVIDER_SITE_OTHER): Payer: Medicare Other

## 2012-11-10 ENCOUNTER — Encounter: Payer: Self-pay | Admitting: Internal Medicine

## 2012-11-10 VITALS — BP 140/85 | HR 68 | Temp 97.4°F | Resp 16 | Ht 64.0 in | Wt 173.0 lb

## 2012-11-10 DIAGNOSIS — Z136 Encounter for screening for cardiovascular disorders: Secondary | ICD-10-CM

## 2012-11-10 DIAGNOSIS — I10 Essential (primary) hypertension: Secondary | ICD-10-CM

## 2012-11-10 DIAGNOSIS — R011 Cardiac murmur, unspecified: Secondary | ICD-10-CM

## 2012-11-10 DIAGNOSIS — Z0000 Encounter for general adult medical examination without abnormal findings: Secondary | ICD-10-CM

## 2012-11-10 DIAGNOSIS — F411 Generalized anxiety disorder: Secondary | ICD-10-CM

## 2012-11-10 DIAGNOSIS — K219 Gastro-esophageal reflux disease without esophagitis: Secondary | ICD-10-CM

## 2012-11-10 DIAGNOSIS — I959 Hypotension, unspecified: Secondary | ICD-10-CM

## 2012-11-10 DIAGNOSIS — N259 Disorder resulting from impaired renal tubular function, unspecified: Secondary | ICD-10-CM

## 2012-11-10 LAB — CBC WITH DIFFERENTIAL/PLATELET
Basophils Absolute: 0 10*3/uL (ref 0.0–0.1)
Basophils Relative: 0.7 % (ref 0.0–3.0)
Eosinophils Absolute: 0.4 10*3/uL (ref 0.0–0.7)
Eosinophils Relative: 6.7 % — ABNORMAL HIGH (ref 0.0–5.0)
HCT: 39.6 % (ref 36.0–46.0)
Hemoglobin: 13.5 g/dL (ref 12.0–15.0)
Lymphocytes Relative: 22.9 % (ref 12.0–46.0)
Lymphs Abs: 1.3 10*3/uL (ref 0.7–4.0)
MCHC: 34 g/dL (ref 30.0–36.0)
MCV: 93 fl (ref 78.0–100.0)
Monocytes Absolute: 0.6 10*3/uL (ref 0.1–1.0)
Monocytes Relative: 11 % (ref 3.0–12.0)
Neutro Abs: 3.4 10*3/uL (ref 1.4–7.7)
Neutrophils Relative %: 58.7 % (ref 43.0–77.0)
Platelets: 223 10*3/uL (ref 150.0–400.0)
RBC: 4.26 Mil/uL (ref 3.87–5.11)
RDW: 14.2 % (ref 11.5–14.6)
WBC: 5.8 10*3/uL (ref 4.5–10.5)

## 2012-11-10 LAB — BASIC METABOLIC PANEL
BUN: 23 mg/dL (ref 6–23)
CO2: 26 mEq/L (ref 19–32)
Calcium: 9.1 mg/dL (ref 8.4–10.5)
Chloride: 104 mEq/L (ref 96–112)
Creatinine, Ser: 1.2 mg/dL (ref 0.4–1.2)
GFR: 47.34 mL/min — ABNORMAL LOW (ref >60.00)
Glucose, Bld: 92 mg/dL (ref 70–99)
Potassium: 4.4 mEq/L (ref 3.5–5.1)
Sodium: 138 mEq/L (ref 135–145)

## 2012-11-10 LAB — URINALYSIS
Bilirubin Urine: NEGATIVE
Hgb urine dipstick: NEGATIVE
Ketones, ur: NEGATIVE
Leukocytes, UA: NEGATIVE
Nitrite: NEGATIVE
Specific Gravity, Urine: 1.01 (ref 1.000–1.030)
Total Protein, Urine: NEGATIVE
Urine Glucose: NEGATIVE
Urobilinogen, UA: 1 (ref 0.0–1.0)
pH: 7 (ref 5.0–8.0)

## 2012-11-10 LAB — HEPATIC FUNCTION PANEL
ALT: 20 U/L (ref 0–35)
AST: 33 U/L (ref 0–37)
Albumin: 3.5 g/dL (ref 3.5–5.2)
Alkaline Phosphatase: 54 U/L (ref 39–117)
Bilirubin, Direct: 0.2 mg/dL (ref 0.0–0.3)
Total Bilirubin: 0.8 mg/dL (ref 0.3–1.2)
Total Protein: 6.5 g/dL (ref 6.0–8.3)

## 2012-11-10 LAB — TSH: TSH: 2.74 u[IU]/mL (ref 0.35–5.50)

## 2012-11-10 NOTE — Assessment & Plan Note (Signed)
Continue with current prescription therapy as reflected on the Med list.  

## 2012-11-10 NOTE — Assessment & Plan Note (Signed)
Here for medicare wellness/physical  Diet: heart healthy  Physical activity: sedentary  Depression/mood screen: negative  Hearing: intact to whispered voice  Visual acuity: grossly normal, performs annual eye exam  ADLs: capable  Fall risk: none  Home safety: good  Cognitive evaluation: intact to orientation, naming, recall and repetition  EOL planning: adv directives, full code/ I agree  I have personally reviewed and have noted  1. The patient's medical and social history  2. Their use of alcohol, tobacco or illicit drugs  3. Their current medications and supplements  4. The patient's functional ability including ADL's, fall risks, home safety risks and hearing or visual impairment.  5. Diet and physical activities  6. Evidence for depression or mood disorders    Today patient counseled on age appropriate routine health concerns for screening and prevention, each reviewed and up to date or declined. Immunizations reviewed and up to date or declined. Labs ordered and reviewed. Risk factors for depression reviewed and negative. Hearing function and visual acuity are intact. ADLs screened and addressed as needed. Functional ability and level of safety reviewed and appropriate. Education, counseling and referrals performed based on assessed risks today. Patient provided with a copy of personalized plan for preventive services.      

## 2012-11-10 NOTE — Progress Notes (Signed)
   Subjective:   HPI  The patient is here for a wellness exam. The patient has been doing well overall without major physical or psychological issues going on lately.  The patient needs to address  chronic hypertension that has been well controlled with medicine  F/u bruising - better F/u elev. BP  F/u R hip pain.  Review of Systems  Constitutional: Negative for appetite change and fatigue.  HENT: Negative for neck pain and tinnitus.   Eyes: Negative for pain.  Respiratory: Negative for chest tightness and shortness of breath.   Cardiovascular: Negative for chest pain.  Musculoskeletal: Negative for back pain.  Neurological: Negative for dizziness, speech difficulty and weakness.  Hematological: Negative for adenopathy. Bruises/bleeds easily.  Psychiatric/Behavioral: Negative for behavioral problems and dysphoric mood.   Wt Readings from Last 3 Encounters:  11/10/12 173 lb (78.472 kg)  06/04/12 160 lb (72.576 kg)  05/21/12 160 lb (72.576 kg)       BP Readings from Last 3 Encounters:  11/10/12 148/98  06/04/12 120/80  05/21/12 148/88    Objective:   Physical Exam  Constitutional: She appears well-developed and well-nourished. No distress.  HENT:  Head: Normocephalic.  Right Ear: External ear normal.  Left Ear: External ear normal.  Nose: Nose normal.  Mouth/Throat: Oropharynx is clear and moist.  Eyes: Conjunctivae are normal. Pupils are equal, round, and reactive to light. Right eye exhibits no discharge. Left eye exhibits no discharge.  Neck: Normal range of motion. Neck supple. No JVD present. No tracheal deviation present. No thyromegaly present.  Cardiovascular: Normal rate and regular rhythm.   Murmur (1-2/6) heard. Pulmonary/Chest: No stridor. No respiratory distress. She has no wheezes.  Abdominal: Soft. Bowel sounds are normal. She exhibits no distension and no mass. There is no tenderness. There is no rebound and no guarding.  Musculoskeletal: She exhibits  no edema and no tenderness.  Lymphadenopathy:    She has no cervical adenopathy.  Neurological: She displays normal reflexes. No cranial nerve deficit. She exhibits normal muscle tone. Coordination normal.  Skin: No rash noted. No erythema.  Tanned.   Psychiatric: She has a normal mood and affect. Her behavior is normal. Judgment and thought content normal.  H-P (+) on R     Lab Results  Component Value Date   WBC 5.8 11/10/2012   HGB 13.5 11/10/2012   HCT 39.6 11/10/2012   PLT 223.0 11/10/2012   GLUCOSE 92 11/10/2012   CHOL 173 10/31/2011   TRIG 118.0 10/31/2011   HDL 89.70 10/31/2011   LDLCALC 60 10/31/2011   ALT 20 11/10/2012   AST 33 11/10/2012   NA 138 11/10/2012   K 4.4 11/10/2012   CL 104 11/10/2012   CREATININE 1.2 11/10/2012   BUN 23 11/10/2012   CO2 26 11/10/2012   TSH 2.74 11/10/2012   INR 1.0 01/26/2011       Assessment & Plan:

## 2012-11-10 NOTE — Assessment & Plan Note (Signed)
Labs

## 2012-11-11 DIAGNOSIS — R011 Cardiac murmur, unspecified: Secondary | ICD-10-CM | POA: Insufficient documentation

## 2012-11-11 NOTE — Assessment & Plan Note (Signed)
No relapse 

## 2012-11-11 NOTE — Assessment & Plan Note (Signed)
ECHO ordered 

## 2012-11-11 NOTE — Assessment & Plan Note (Signed)
Continue with current prescription therapy as reflected on the Med list.  

## 2012-11-18 ENCOUNTER — Other Ambulatory Visit (HOSPITAL_COMMUNITY): Payer: Medicare Other

## 2012-11-20 ENCOUNTER — Ambulatory Visit (HOSPITAL_COMMUNITY): Payer: Medicare Other | Attending: Cardiology

## 2012-11-20 DIAGNOSIS — I079 Rheumatic tricuspid valve disease, unspecified: Secondary | ICD-10-CM | POA: Insufficient documentation

## 2012-11-20 DIAGNOSIS — I10 Essential (primary) hypertension: Secondary | ICD-10-CM | POA: Insufficient documentation

## 2012-11-20 DIAGNOSIS — I059 Rheumatic mitral valve disease, unspecified: Secondary | ICD-10-CM | POA: Insufficient documentation

## 2012-11-20 DIAGNOSIS — K219 Gastro-esophageal reflux disease without esophagitis: Secondary | ICD-10-CM | POA: Insufficient documentation

## 2012-11-20 DIAGNOSIS — R011 Cardiac murmur, unspecified: Secondary | ICD-10-CM | POA: Insufficient documentation

## 2012-11-20 NOTE — Progress Notes (Signed)
Echocardiogram performed.  

## 2012-11-21 ENCOUNTER — Telehealth: Payer: Self-pay | Admitting: Internal Medicine

## 2012-11-21 DIAGNOSIS — I480 Paroxysmal atrial fibrillation: Secondary | ICD-10-CM

## 2012-11-21 MED ORDER — ASPIRIN EC 325 MG PO TBEC: 325.0000 mg | DELAYED_RELEASE_TABLET | Freq: Every day | ORAL | Status: DC

## 2012-11-21 NOTE — Telephone Encounter (Signed)
Called pt re: A fib on ECHO No sx's --- PAF I asked her to check pulse - the pt is not sure how to do it  ASA has caused bruising in the past and she stopped it Xarelto was offered. She would rather try ASA again -- 325 mg/d [facility] cons Dr Katrinka Blazing

## 2012-11-28 ENCOUNTER — Telehealth: Payer: Self-pay | Admitting: Internal Medicine

## 2012-11-28 ENCOUNTER — Ambulatory Visit (INDEPENDENT_AMBULATORY_CARE_PROVIDER_SITE_OTHER): Payer: Medicare Other | Admitting: Internal Medicine

## 2012-11-28 ENCOUNTER — Encounter: Payer: Self-pay | Admitting: Internal Medicine

## 2012-11-28 VITALS — BP 102/74 | HR 74 | Temp 97.4°F

## 2012-11-28 DIAGNOSIS — R0981 Nasal congestion: Secondary | ICD-10-CM

## 2012-11-28 DIAGNOSIS — J309 Allergic rhinitis, unspecified: Secondary | ICD-10-CM

## 2012-11-28 DIAGNOSIS — R0982 Postnasal drip: Secondary | ICD-10-CM

## 2012-11-28 DIAGNOSIS — J3489 Other specified disorders of nose and nasal sinuses: Secondary | ICD-10-CM

## 2012-11-28 MED ORDER — FLUTICASONE PROPIONATE 50 MCG/ACT NA SUSP: 2.0000 | Freq: Every day | NASAL | Status: DC

## 2012-11-28 MED ORDER — FEXOFENADINE HCL 180 MG PO TABS: 180.0000 mg | ORAL_TABLET | Freq: Every day | ORAL | Status: DC

## 2012-11-28 MED ORDER — AZELASTINE HCL 0.1 % NA SOLN: 2.0000 | Freq: Two times a day (BID) | NASAL | Status: DC

## 2012-11-28 MED ORDER — METHYLPREDNISOLONE ACETATE 80 MG/ML IJ SUSP
80.0000 mg | Freq: Once | INTRAMUSCULAR | Status: AC
  Administered 2012-11-28: 80 mg via INTRAMUSCULAR

## 2012-11-28 NOTE — Progress Notes (Signed)
  Subjective:    HPI  complains of runny nose and nasal congestion   Past Medical History  Diagnosis Date  . History of colon polyps   . GERD (gastroesophageal reflux disease)   . HTN (hypertension)   . SVT (supraventricular tachycardia)     brief history  . PAC (premature atrial contraction)     Symptomatiic  . Anxiety   . Allergic rhinitis   . IBS (irritable bowel syndrome)     constipation predominant - Dr Kinnie Scales  . Diverticulosis of colon   . Hip bursitis 2010    Dr Wyline Mood, Post op seroma  . Osteopenia     Review of Systems Constitutional: No fever or night sweats, no unexpected weight change Pulmonary: No pleurisy or hemoptysis Cardiovascular: No chest pain or palpitations     Objective:   Physical Exam BP 102/74  Pulse 74  Temp(Src) 97.4 F (36.3 C) (Oral)  SpO2 97% GEN: nontoxic appearing and audible head congestion HENT: NCAT, no sinus tenderness bilaterally, nares with clear discharge, oropharynx mild erythema, cobblestoning and no exudate; B TMs with clear effusion, no erythema Eyes: Vision grossly intact, no conjunctivitis Lungs: Clear to auscultation without rhonchi or wheeze, no increased work of breathing Cardiovascular: Regular rate and irrhythm, no bilateral edema      Assessment & Plan:   allergic rhinitis with nasal congestion Cough, postnasal drip related to above   Explained lack of efficacy for antibiotics in non infectious dz Flonase and Astelin - new prescriptions done

## 2012-11-28 NOTE — Telephone Encounter (Signed)
Patient Information:  Caller Name: Karlei  Phone: [phone]  Patient: Hailey [name], Hailey [name]  Gender: Female  DOB: [birth year]  Age: 78 Years  PCP: Plotnikov, Alex (Adults only)  Office Follow Up:  Does the office need to follow up with this patient?: No  Instructions For The Office: N/A   Symptoms  Reason For Call & Symptoms: Pt has had congestion x 1 week. Pt developed a severely painful throat since yesterday. No fever.  Reviewed Health History In EMR: Yes  Reviewed Medications In EMR: Yes  Reviewed Allergies In EMR: Yes  Reviewed Surgeries / Procedures: Yes  Date of Onset of Symptoms: 11/27/2012  Treatments Tried: Ibuprofen  Treatments Tried Worked: No  Guideline(s) Used:  Sore Throat  Disposition Per Guideline:   See Today in Office  Reason For Disposition Reached:   Severe sore throat pain  Advice Given:  N/A  Patient Will Follow Care Advice:  YES  Appointment Scheduled:  11/28/2012 11:30:00 Appointment Scheduled Provider:  Rene Paci (Adults only)

## 2012-11-28 NOTE — Patient Instructions (Signed)
It was good to see you today. Medrol shot given today Start flonase and Astelin spray for your allergy symptoms - Your prescription(s) have been submitted to your pharmacy. Please take as directed and contact our office if you believe you are having problem(s) with the medication(s). If you develop worsening symptoms or fever, call and we can reconsider antibiotics, but it does not appear necessary to use antibiotics at this time.

## 2012-12-04 ENCOUNTER — Encounter: Payer: Self-pay | Admitting: Internal Medicine

## 2012-12-04 ENCOUNTER — Telehealth: Payer: Self-pay | Admitting: Internal Medicine

## 2012-12-04 ENCOUNTER — Ambulatory Visit (INDEPENDENT_AMBULATORY_CARE_PROVIDER_SITE_OTHER): Payer: Medicare Other | Admitting: Internal Medicine

## 2012-12-04 VITALS — BP 112/86 | HR 83 | Temp 97.9°F

## 2012-12-04 DIAGNOSIS — J309 Allergic rhinitis, unspecified: Secondary | ICD-10-CM

## 2012-12-04 MED ORDER — PREDNISONE 10 MG PO TABS: ORAL_TABLET | ORAL | Status: DC

## 2012-12-04 NOTE — Telephone Encounter (Signed)
Patient Information:  Caller Name: Kerensa  Phone: [phone]  Patient: Hailey [name], Hailey [name]  Gender: Female  DOB: 1935/05/30  Age: 78 Years  PCP: Plotnikov, Alex (Adults only)  Office Follow Up:  Does the office need to follow up with this patient?: No  Instructions For The Office: N/A  RN Note:  Fatigued with ongoing URI symptoms; Diagnosed with allergies 11/28/12.   Nasal mucus is thick and yellow.  Productive cough; color not noted. No watery eye or nasal itchy. Wheezing "a lot."  Symptoms  Reason For Call & Symptoms: Congestion, frequent blowing nose, cough and hoarseness. Feels symptoms are worse then when seen 11/28/12. Diagnosed with allergies.  Reviewed Health History In EMR: Yes  Reviewed Medications In EMR: Yes  Reviewed Allergies In EMR: Yes  Reviewed Surgeries / Procedures: Yes  Date of Onset of Symptoms: 11/25/2012  Treatments Tried: Flonase, Astelin, Allegra, Prednisone IM  Treatments Tried Worked: No  Guideline(s) Used:  Colds  Disposition Per Guideline:   See Today or Tomorrow in Office  Reason For Disposition Reached:   Nasal discharge present > 10 days  Advice Given:  Reassurance  Colds are very common and may make you feel uncomfortable.  Colds are caused by viruses, and no medicine or "shot" will cure an uncomplicated cold.  For a Runny Nose With Profuse Discharge:   Nasal mucus and discharge helps to wash viruses and bacteria out of the nose and sinuses.  Blowing the nose is all that is needed.  For a Stuffy Nose - Use Nasal Washes:  Introduction: Saline (salt water) nasal irrigation (nasal wash) is an effective and simple home remedy for treating stuffy nose and sinus congestion. The nose can be irrigated by pouring, spraying, or squirting salt water into the nose and then letting it run back out.  How it Helps: The salt water rinses out excess mucus, washes out any irritants (dust, allergens) that might be present, and moistens the nasal cavity.  Humidifier:  If the air in your home is dry, use a cool-mist humidifier  Expected Course:   Fever may last 2-3 days  Nasal discharge 7-14 days  Cough up to 2-3 weeks.  Call Back If:  Difficulty breathing occurs  Cough lasts more than 3 weeks  You become worse  Patient Will Follow Care Advice:  YES  Appointment Scheduled:  12/04/2012 16:00:00 Appointment Scheduled Provider:  Nicki Reaper

## 2012-12-04 NOTE — Patient Instructions (Signed)
Allergic Rhinitis  Allergic rhinitis is when the mucous membranes in the nose respond to allergens. Allergens are particles in the air that cause your body to have an allergic reaction. This causes you to release allergic antibodies. Through a chain of events, these eventually cause you to release histamine into the blood stream (hence the use of antihistamines). Although meant to be protective to the body, it is this release that causes your discomfort, such as frequent sneezing, congestion and an itchy runny nose.    CAUSES    The pollen allergens may come from grasses, trees, and weeds. This is seasonal allergic rhinitis, or "hay fever." Other allergens cause year-round allergic rhinitis (perennial allergic rhinitis) such as house dust mite allergen, pet dander and mold spores.    SYMPTOMS     Nasal stuffiness (congestion).   Runny, itchy nose with sneezing and tearing of the eyes.   There is often an itching of the mouth, eyes and ears.  It cannot be cured, but it can be controlled with medications.  DIAGNOSIS    If you are unable to determine the offending allergen, skin or blood testing may find it.  TREATMENT     Avoid the allergen.   Medications and allergy shots (immunotherapy) can help.   Hay fever may often be treated with antihistamines in pill or nasal spray forms. Antihistamines block the effects of histamine. There are over-the-counter medicines that may help with nasal congestion and swelling around the eyes. Check with your caregiver before taking or giving this medicine.  If the treatment above does not work, there are many new medications your caregiver can prescribe. Stronger medications may be used if initial measures are ineffective. Desensitizing injections can be used if medications and avoidance fails. Desensitization is when a patient is given ongoing shots until the body becomes less sensitive to the allergen. Make sure you follow up with your caregiver if problems continue.   SEEK MEDICAL CARE IF:     You develop fever (more than 100.5 F (38.1 C).   You develop a cough that does not stop easily (persistent).   You have shortness of breath.   You start wheezing.   Symptoms interfere with normal daily activities.  Document Released: 05/22/2001 Document Revised: 11/19/2011 Document Reviewed: 12/01/2008  Exit[employer] Patient Information 2013 [employer].

## 2012-12-05 ENCOUNTER — Encounter: Payer: Self-pay | Admitting: Internal Medicine

## 2012-12-05 NOTE — Progress Notes (Signed)
HPI  Pt presents to the clinic today with c/o cold symptoms x 2 weeks. She saw Dr. Felicity Coyer 1 week ago and was diagnosed with allergic rhinitis. She was started on allegra and a nasal spray. Pt feels like the symptoms are somewhat improved but ot completely resolved. She denies fever, chills or body aches. She has not had sick contacts. She has had a history of allergies in the past but not asthma.  Review of Systems      Past Medical History  Diagnosis Date  . History of colon polyps   . GERD (gastroesophageal reflux disease)   . HTN (hypertension)   . SVT (supraventricular tachycardia)     brief history  . PAC (premature atrial contraction)     Symptomatiic  . Anxiety   . Allergic rhinitis   . IBS (irritable bowel syndrome)     constipation predominant - Dr Kinnie Scales  . Diverticulosis of colon   . Hip bursitis 2010    Dr Wyline Mood, Post op seroma  . Osteopenia     Family History  Problem Relation Age of Onset  . Colon cancer Mother   . Cancer Mother     colon  . Cancer Father     Prostate  . Diabetes Father   . Cancer Brother     Prostate    History   Social History  . Marital Status: Married    Spouse Name: N/A    Number of Children: 2  . Years of Education: N/A   Occupational History  . Travel Agent    Social History Main Topics  . Smoking status: Former Smoker    Quit date: 09/11/1975  . Smokeless tobacco: Never Used  . Alcohol Use: 3.0 oz/week    6 drink(s) per week  . Drug Use: No  . Sexually Active: Yes    Birth Control/ Protection: Surgical   Other Topics Concern  . Not on file   Social History Narrative   Regular Exercise -  YES          Allergies  Allergen Reactions  . Meloxicam (Meloxicam) Other (See Comments)    Jittery and headache  . Sulfamethoxazole W-Trimethoprim Nausea Only     Constitutional: Positive headache. Denies fatigue, fever or abrupt weight changes.  HEENT:  Positive nasal congestion. Denies eye redness, eye pain,  pressure behind the eyes, facial pain, nasal congestion, ear pain, ringing in the ears, wax buildup, runny nose or bloody nose. Respiratory: Positive cough. Denies difficulty breathing or shortness of breath.  Cardiovascular: Denies chest pain, chest tightness, palpitations or swelling in the hands or feet.   No other specific complaints in a complete review of systems (except as listed in HPI above).  Objective:   BP 112/86  Pulse 83  Temp(Src) 97.9 F (36.6 C) (Oral)  SpO2 97% Wt Readings from Last 3 Encounters:  11/10/12 173 lb (78.472 kg)  06/04/12 160 lb (72.576 kg)  05/21/12 160 lb (72.576 kg)     General: Appears her stated age, well developed, well nourished in NAD. HEENT: Head: normal shape and size; Eyes: sclera white, no icterus, conjunctiva pink, PERRLA and EOMs intact; Ears: Tm's gray and intact, normal light reflex; Nose: mucosa pink and moist, septum midline; Throat/Mouth: + PND. Teeth present, mucosa erythematous and moist, no exudate noted, no lesions or ulcerations noted.  Neck: Neck supple, trachea midline. No massses, lumps or thyromegaly present.  Cardiovascular: Normal rate and rhythm. S1,S2 noted.  No murmur, rubs or gallops noted.  No JVD or BLE edema. No carotid bruits noted. Pulmonary/Chest: Normal effort and positive vesicular breath sounds. No respiratory distress. No wheezes, rales or ronchi noted.      Assessment & Plan:   Allergic Rhinitis  Continue current therapy prescribed by Dr. Felicity Coyer Will add a steroid taper to see if the symptoms resolve  RTC as needed or if symptoms persist.

## 2012-12-22 ENCOUNTER — Other Ambulatory Visit: Payer: Self-pay

## 2012-12-22 MED ORDER — ESTRADIOL 0.05 MG/24HR TD PTWK: 1.0000 | MEDICATED_PATCH | TRANSDERMAL | Status: DC

## 2012-12-23 ENCOUNTER — Telehealth: Payer: Self-pay | Admitting: *Deleted

## 2012-12-23 NOTE — Telephone Encounter (Signed)
Prior authorization faxed to [facility] for estradiol 0.05mg  patch.

## 2012-12-25 NOTE — Telephone Encounter (Signed)
Estradiol patch 0.05mg  patch approved thru 09/09/13. Pharmacy informed as well.

## 2013-01-15 ENCOUNTER — Other Ambulatory Visit: Payer: Self-pay | Admitting: Internal Medicine

## 2013-01-15 ENCOUNTER — Other Ambulatory Visit: Payer: Self-pay

## 2013-01-15 MED ORDER — ESTRADIOL 0.05 MG/24HR TD PTWK: 1.0000 | MEDICATED_PATCH | TRANSDERMAL | Status: DC

## 2013-02-03 ENCOUNTER — Emergency Department (HOSPITAL_COMMUNITY): Payer: Medicare Other

## 2013-02-03 ENCOUNTER — Encounter (HOSPITAL_COMMUNITY): Payer: Self-pay | Admitting: Radiology

## 2013-02-03 ENCOUNTER — Emergency Department (HOSPITAL_COMMUNITY)
Admission: EM | Admit: 2013-02-03 | Discharge: 2013-02-03 | Disposition: A | Discharge status: Home / Self Care | Payer: Medicare Other | Attending: Emergency Medicine | Admitting: Emergency Medicine

## 2013-02-03 DIAGNOSIS — I951 Orthostatic hypotension: Secondary | ICD-10-CM | POA: Insufficient documentation

## 2013-02-03 DIAGNOSIS — R61 Generalized hyperhidrosis: Secondary | ICD-10-CM | POA: Insufficient documentation

## 2013-02-03 DIAGNOSIS — Z79899 Other long term (current) drug therapy: Secondary | ICD-10-CM | POA: Insufficient documentation

## 2013-02-03 DIAGNOSIS — R110 Nausea: Secondary | ICD-10-CM | POA: Insufficient documentation

## 2013-02-03 DIAGNOSIS — R911 Solitary pulmonary nodule: Secondary | ICD-10-CM | POA: Insufficient documentation

## 2013-02-03 DIAGNOSIS — R5383 Other fatigue: Secondary | ICD-10-CM | POA: Insufficient documentation

## 2013-02-03 DIAGNOSIS — R55 Syncope and collapse: Secondary | ICD-10-CM

## 2013-02-03 DIAGNOSIS — F411 Generalized anxiety disorder: Secondary | ICD-10-CM | POA: Insufficient documentation

## 2013-02-03 DIAGNOSIS — Z860100 Personal history of colon polyps, unspecified: Secondary | ICD-10-CM | POA: Insufficient documentation

## 2013-02-03 DIAGNOSIS — I10 Essential (primary) hypertension: Secondary | ICD-10-CM | POA: Insufficient documentation

## 2013-02-03 DIAGNOSIS — R5381 Other malaise: Secondary | ICD-10-CM | POA: Insufficient documentation

## 2013-02-03 DIAGNOSIS — R42 Dizziness and giddiness: Secondary | ICD-10-CM | POA: Insufficient documentation

## 2013-02-03 DIAGNOSIS — Z8739 Personal history of other diseases of the musculoskeletal system and connective tissue: Secondary | ICD-10-CM | POA: Insufficient documentation

## 2013-02-03 DIAGNOSIS — Z8719 Personal history of other diseases of the digestive system: Secondary | ICD-10-CM | POA: Insufficient documentation

## 2013-02-03 DIAGNOSIS — Z8679 Personal history of other diseases of the circulatory system: Secondary | ICD-10-CM | POA: Insufficient documentation

## 2013-02-03 DIAGNOSIS — K219 Gastro-esophageal reflux disease without esophagitis: Secondary | ICD-10-CM | POA: Insufficient documentation

## 2013-02-03 DIAGNOSIS — Z87891 Personal history of nicotine dependence: Secondary | ICD-10-CM | POA: Insufficient documentation

## 2013-02-03 LAB — COMPREHENSIVE METABOLIC PANEL
ALT: 15 U/L (ref 0–35)
AST: 27 U/L (ref 0–37)
Albumin: 3.2 g/dL — ABNORMAL LOW (ref 3.5–5.2)
Alkaline Phosphatase: 58 U/L (ref 39–117)
BUN: 25 mg/dL — ABNORMAL HIGH (ref 6–23)
CO2: 19 mEq/L (ref 19–32)
Calcium: 9 mg/dL (ref 8.4–10.5)
Chloride: 103 mEq/L (ref 96–112)
Creatinine, Ser: 1.24 mg/dL — ABNORMAL HIGH (ref 0.50–1.10)
GFR calc Af Amer: 48 mL/min — ABNORMAL LOW (ref >90)
GFR calc non Af Amer: 41 mL/min — ABNORMAL LOW (ref >90)
Glucose, Bld: 99 mg/dL (ref 70–99)
Potassium: 4.4 mEq/L (ref 3.5–5.1)
Sodium: 136 mEq/L (ref 135–145)
Total Bilirubin: 0.5 mg/dL (ref 0.3–1.2)
Total Protein: 6.3 g/dL (ref 6.0–8.3)

## 2013-02-03 LAB — CBC WITH DIFFERENTIAL/PLATELET
Basophils Absolute: 0 10*3/uL (ref 0.0–0.1)
Basophils Relative: 0 % (ref 0–1)
Eosinophils Absolute: 0.2 10*3/uL (ref 0.0–0.7)
Eosinophils Relative: 2 % (ref 0–5)
HCT: 37.4 % (ref 36.0–46.0)
Hemoglobin: 13 g/dL (ref 12.0–15.0)
Lymphocytes Relative: 9 % — ABNORMAL LOW (ref 12–46)
Lymphs Abs: 0.8 10*3/uL (ref 0.7–4.0)
MCH: 31.4 pg (ref 26.0–34.0)
MCHC: 34.8 g/dL (ref 30.0–36.0)
MCV: 90.3 fL (ref 78.0–100.0)
Monocytes Absolute: 0.7 10*3/uL (ref 0.1–1.0)
Monocytes Relative: 7 % (ref 3–12)
Neutro Abs: 7.5 10*3/uL (ref 1.7–7.7)
Neutrophils Relative %: 82 % — ABNORMAL HIGH (ref 43–77)
Platelets: 214 10*3/uL (ref 150–400)
RBC: 4.14 MIL/uL (ref 3.87–5.11)
RDW: 14.3 % (ref 11.5–15.5)
WBC: 9.2 10*3/uL (ref 4.0–10.5)

## 2013-02-03 LAB — URINALYSIS, ROUTINE W REFLEX MICROSCOPIC
Glucose, UA: NEGATIVE mg/dL
Hgb urine dipstick: NEGATIVE
Ketones, ur: 15 mg/dL — AB
Nitrite: NEGATIVE
Protein, ur: NEGATIVE mg/dL
Specific Gravity, Urine: 1.024 (ref 1.005–1.030)
Urobilinogen, UA: 1 mg/dL (ref 0.0–1.0)
pH: 6 (ref 5.0–8.0)

## 2013-02-03 LAB — D-DIMER, QUANTITATIVE (NOT AT ARMC): D-Dimer, Quant: 0.78 ug/mL-FEU — ABNORMAL HIGH (ref 0.00–0.48)

## 2013-02-03 LAB — URINE MICROSCOPIC-ADD ON

## 2013-02-03 LAB — TROPONIN I: Troponin I: <0.3 ng/mL (ref <0.30)

## 2013-02-03 MED ORDER — IOHEXOL 350 MG/ML SOLN
100.0000 mL | Freq: Once | INTRAVENOUS | Status: AC | PRN
  Administered 2013-02-03: 100 mL via INTRAVENOUS

## 2013-02-03 MED ORDER — ONDANSETRON HCL 4 MG/2ML IJ SOLN
4.0000 mg | Freq: Once | INTRAMUSCULAR | Status: AC
  Administered 2013-02-03: 4 mg via INTRAVENOUS
  Filled 2013-02-03: qty 2

## 2013-02-03 MED ORDER — SODIUM CHLORIDE 0.9 % IV BOLUS (SEPSIS)
1000.0000 mL | Freq: Once | INTRAVENOUS | Status: AC
Start: 2013-02-03 — End: 2013-02-03
  Administered 2013-02-03: 1000 mL via INTRAVENOUS

## 2013-02-03 NOTE — ED Notes (Signed)
According to EMS, patient got on a flight from [place] and the cramping, nausea started after she got on the flight.  She had a layover in [place] and she never had diarrhea.  The nausea eased off but the cramping was worse.  She advised the flight attendant she would need a wheelchair.  Her intial blood pressure 60/40, supine.  It did come up to 90/64.  When stood up it went back down.  EMS transported her here.  Patient denies chest pain, SOB, but does have weakness and dizziness.

## 2013-02-03 NOTE — ED Notes (Addendum)
Pt ambulated around hallways. Pt denies dizziness or shortness of breath. Pt O2 sats  Mainly above 92% 2 episodes O2 sats 88% pt denies SOB- sats increased with rest. HR 76-88 bpm. Dr. Manus Gunning made aware.

## 2013-02-03 NOTE — ED Provider Notes (Signed)
History     CSN: [id number]  Arrival date & time 02/03/13  1452   First MD Initiated Contact with Patient 02/03/13 1502      Chief Complaint  Patient presents with  . Near Syncope    (Consider location/radiation/quality/duration/timing/severity/associated sxs/prior treatment) HPI Comments: Patient presents after episode of dizziness, nausea, lightheadedness and near syncope. She sitting on airplane returning from a four-day trip to [place] when she began to feel nauseated, lightheaded and hot. The symptoms worsened when she sat up and she felt like she is going to pass out. Denies any chest pain, palpitations or shortness of breath. He is feeling better now after some fluids. Her systolic blood pressures in the 60s and EMS arrival. Notably she has a history of atrial fibrillation that is paroxysmal this is noted on a echocardiogram in March and she is not yet seen cardiology but is scheduled to see Dr. Katrinka Blazing next week. He denies any palpitations, leg pain or swelling. She denies any excessive heat exposure for her. She did not eat lunch today but did eat breakfast normally. States compliance with her medications and no recent changes. She's had similar near syncope episodes in the past without official diagnoses.  The history is provided by the patient and the EMS personnel.    Past Medical History  Diagnosis Date  . History of colon polyps   . GERD (gastroesophageal reflux disease)   . HTN (hypertension)   . SVT (supraventricular tachycardia)     brief history  . PAC (premature atrial contraction)     Symptomatiic  . Anxiety   . Allergic rhinitis   . IBS (irritable bowel syndrome)     constipation predominant - Dr Kinnie Scales  . Diverticulosis of colon   . Hip bursitis 2010    Dr Wyline Mood, Post op seroma  . Osteopenia     Past Surgical History  Procedure Laterality Date  . Bladder surgery    . Breast reduction surgery    . Appendectomy    . Troch bursa resection  2010  .  Vaginal hysterectomy      BSO  . Oophorectomy      BSO  . Toe surgery      Family History  Problem Relation Age of Onset  . Colon cancer Mother   . Cancer Mother     colon  . Cancer Father     Prostate  . Diabetes Father   . Cancer Brother     Prostate    History  Substance Use Topics  . Smoking status: Former Smoker    Quit date: 09/11/1975  . Smokeless tobacco: Never Used  . Alcohol Use: 3.0 oz/week    6 drink(s) per week    OB History   Grav Para Term Preterm Abortions TAB SAB Ect Mult Living   2 2 2       2       Review of Systems  Constitutional: Positive for activity change, appetite change and fatigue. Negative for fever.  HENT: Negative for congestion and rhinorrhea.   Respiratory: Negative for cough, chest tightness and shortness of breath.   Cardiovascular: Negative for chest pain.  Gastrointestinal: Positive for nausea. Negative for vomiting and abdominal pain.  Genitourinary: Negative for dysuria and hematuria.  Musculoskeletal: Negative for back pain.  Skin: Negative for rash.  Neurological: Positive for dizziness, weakness and light-headedness. Negative for headaches.  A complete 10 system review of systems was obtained and all systems are negative except as noted in  the HPI and PMH.    Allergies  Meloxicam and Sulfamethoxazole w-trimethoprim  Home Medications   Current Outpatient Rx  Name  Route  Sig  Dispense  Refill  . aspirin EC 325 MG tablet   Oral   Take 1 tablet (325 mg total) by mouth daily.   100 tablet   3   . azelastine (ASTELIN) 137 MCG/SPRAY nasal spray   Nasal   Place 2 sprays into the nose 2 (two) times daily. Use in each nostril as directed   30 mL   2   . clindamycin (CLEOCIN) 2 % vaginal cream   Vaginal   Place 1 applicator vaginally 3 times/day as needed-between meals & bedtime.         . DULoxetine (CYMBALTA) 60 MG capsule      TAKE 1 CAPSULE ONCE DAILY FOR DEPRESSION.   30 capsule   5   . esomeprazole  (NEXIUM) 40 MG capsule   Oral   Take 40 mg by mouth daily before breakfast.           . estradiol (CLIMARA - DOSED IN MG/24 HR) 0.05 mg/24hr   Transdermal   Place 1 patch onto the skin once a week. Apply on tuesdays or wednesdays         . fexofenadine (ALLEGRA) 180 MG tablet   Oral   Take 1 tablet (180 mg total) by mouth daily.         . fluticasone (FLONASE) 50 MCG/ACT nasal spray   Nasal   Place 2 sprays into the nose daily.   16 g   2   . LORazepam (ATIVAN) 1 MG tablet   Oral   Take 1 tablet (1 mg total) by mouth 2 (two) times daily as needed for anxiety.   180 tablet   1   . Multiple Vitamins-Minerals (MULTIVITAMIN,TX-MINERALS) tablet   Oral   Take 1 tablet by mouth daily.           . Probiotic Product (SUPER PROBIOTIC PO)   Oral   Take 1 tablet by mouth daily.          . TOPROL XL 25 MG 24 hr tablet      TAKE (1/2) TABLET TWICE DAILY.   60 each   5     BP 149/88  Pulse 66  Temp(Src) 97.4 F (36.3 C) (Oral)  Resp 16  SpO2 96%  Physical Exam  Constitutional: She is oriented to person, place, and time. She appears well-developed and well-nourished.  HENT:  Head: Normocephalic and atraumatic.  Mouth/Throat: Oropharynx is clear and moist. No oropharyngeal exudate.  Eyes: Conjunctivae and EOM are normal. Pupils are equal, round, and reactive to light.  Neck: Normal range of motion. Neck supple.  Cardiovascular: Normal rate, regular rhythm and normal heart sounds.   No murmur heard. Pulmonary/Chest: Effort normal and breath sounds normal. No respiratory distress.  Abdominal: There is no tenderness. There is no rebound and no guarding.  Musculoskeletal: Normal range of motion. She exhibits no edema and no tenderness.  Neurological: She is alert and oriented to person, place, and time. No cranial nerve deficit. She exhibits normal muscle tone. Coordination normal.  CN 2-12 intact, no ataxia on finger to nose, no nystagmus, 5/5 strength throughout, no  pronator drift, Romberg negative, normal gait.   Skin: Skin is warm.    ED Course  Procedures (including critical care time)  Labs Reviewed  URINALYSIS, ROUTINE W REFLEX MICROSCOPIC - Abnormal; Notable for the  following:    Color, Urine AMBER (*)    APPearance HAZY (*)    Bilirubin Urine SMALL (*)    Ketones, ur 15 (*)    Leukocytes, UA SMALL (*)    All other components within normal limits  CBC WITH DIFFERENTIAL - Abnormal; Notable for the following:    Neutrophils Relative % 82 (*)    Lymphocytes Relative 9 (*)    All other components within normal limits  COMPREHENSIVE METABOLIC PANEL - Abnormal; Notable for the following:    BUN 25 (*)    Creatinine, Ser 1.24 (*)    Albumin 3.2 (*)    GFR calc non Af Amer 41 (*)    GFR calc Af Amer 48 (*)    All other components within normal limits  D-DIMER, QUANTITATIVE - Abnormal; Notable for the following:    D-Dimer, Quant 0.78 (*)    All other components within normal limits  URINE MICROSCOPIC-ADD ON - Abnormal; Notable for the following:    Squamous Epithelial / LPF FEW (*)    Bacteria, UA MANY (*)    Casts HYALINE CASTS (*)    All other components within normal limits  TROPONIN I   Dg Chest 2 View  02/03/2013   *RADIOLOGY REPORT*  Clinical Data: Syncope, hypertension  CHEST - 2 VIEW  Comparison: 09/20/2010  Findings: Minor basilar streaky densities compatible with atelectasis or scarring.  Normal heart size and vascularity.  No CHF or pneumonia.  Negative for effusion or pneumothorax.  Trachea is midline.  Degenerative changes of the spine.  IMPRESSION: Basilar scarring versus atelectasis.  No significant interval change or acute process   Original Report Authenticated By: Judie Petit. Miles Costain, M.D.   Ct Angio Chest Pe W/cm &/or Wo Cm  02/03/2013   *RADIOLOGY REPORT*  Clinical Data: Shortness of breath, syncope, nausea, headache  CT ANGIOGRAPHY CHEST  Technique:  Multidetector CT imaging of the chest using the standard protocol during bolus  administration of intravenous contrast. Multiplanar reconstructed images including MIPs were obtained and reviewed to evaluate the vascular anatomy.  Contrast: OMNIPAQUE IOHEXOL 350 MG/ML SOLN  Comparison: 02/03/2013 chest x-ray  Findings: No significant filling defect or pulmonary embolus demonstrated by CTA.  Normal heart size.  Native coronary calcifications evident.  No pericardial or pleural effusion. Scattered nonenlarged mediastinal lymph nodes.  Elongated mildly ectatic thoracic aorta evident.  Lung windows demonstrate respiratory motion artifact.  Minor dependent bibasilar atelectasis in the lower lobes.  Inferior right middle lobe peripheral 5 mm nodule appears well circumscribed. This is on image 53. No other suspicious pulmonary nodule or mass.  Degenerative changes of the spine diffusely. Remote appearing T11 mild compression fracture.  Included upper abdomen demonstrates a small low density lesion partially imaged in the left upper quadrant, suspect a small left adrenal adenoma.  IMPRESSION: Negative for significant acute pulmonary embolus.  Low volume exam with basilar atelectasis  Incidental 5 mm right middle lobe nonspecific nodule.  If the patient is at high risk for bronchogenic carcinoma, follow- up chest CT at 6-12 months is recommended.  If the patient is at low risk for bronchogenic carcinoma, follow-up chest CT at 12 months is recommended.  This recommendation follows the consensus statement: Guidelines for Management of Small Pulmonary Nodules Detected on CT Scans: A Statement from the Fleischner Society as published in Radiology 2005; 237:395-400.   Original Report Authenticated By: Judie Petit. Shick, M.D.     1. Near syncope   2. Orthostatic hypotension   3. Lung  nodule       MDM  Near syncopal episode with dizziness, lightheadedness, nausea and sweating. No chest pain or shortness of breath. Back to baseline now. Hypotensive in the field. History of paroxysmal atrial  fibrillation, normal sinus now.   2-D echocardiogram from March 2014 Impressions: - The patient appeared to be in atrial fibrillation. Normal LV size and systolic function with mild LV hypertrophy. EF 60-65%. Normal RV size and systolic function. Mild mitral regurgitation.  Patient feels back to baseline. She is tolerating by mouth. She remains in normal sinus rhythm. Troponin is negative. Suspect orthostatic hypotension given initial low blood pressure. Discussed with Dr. Mayford Knife of the cardiology. Patient has followup with Dr. Katrinka Blazing in 2 days. She's not had any atrial fibrillation here.  CT negative for PE. Need for followup in 6-12 months for nodule. Patient aware.  Date: 02/03/2013  Rate: 69  Rhythm: normal sinus rhythm  QRS Axis: normal  Intervals: normal  ST/T Wave abnormalities: normal  Conduction Disutrbances:none  Narrative Interpretation: septal Q waves  Old EKG Reviewed: unchanged     Glynn Octave, MD 02/04/13 [phone]

## 2013-02-03 NOTE — ED Notes (Signed)
CT aware pt has 20g IV in [facility]

## 2013-02-05 ENCOUNTER — Telehealth: Payer: Self-pay | Admitting: *Deleted

## 2013-02-05 MED ORDER — ESTRADIOL 0.05 MG/24HR TD PTWK: 1.0000 | MEDICATED_PATCH | TRANSDERMAL | Status: DC

## 2013-02-05 NOTE — Telephone Encounter (Signed)
Pt called requesting refill on estradiol patch 0.05mg ,pt has annual scheduled on 03/04/13.

## 2013-02-10 ENCOUNTER — Ambulatory Visit (INDEPENDENT_AMBULATORY_CARE_PROVIDER_SITE_OTHER): Payer: Medicare Other | Admitting: Sports Medicine

## 2013-02-10 VITALS — BP 128/80 | Ht 64.0 in | Wt 170.0 lb

## 2013-02-10 DIAGNOSIS — M76899 Other specified enthesopathies of unspecified lower limb, excluding foot: Secondary | ICD-10-CM

## 2013-02-10 NOTE — Progress Notes (Signed)
  Subjective:    Patient ID: Hailey [name], female    DOB: [birth year], 78 y.o.   MRN: [id number]  Chief Complaint: left hip pain HPI 78 yo female who presents with chronic left hip pain. She reports pain located on the anterior/lateral aspect of the hip, radiates around to the back. Pain is a constant dull ache when present. No weakness associated with pain. She has been exercising with a trainer at the gym 3 times a week for the last 6-8 months and has been noticing worst pain the day after exercising. Pain doesn't occur with exercise. Pain is worst with getting out of chair after sitting for a long time. Laying down in certain positions also aggravates pain. Aleve and icy hot have helped with pain.   Review of Systems Negative except per HPI    Objective:   Physical Exam Filed Vitals:   02/10/13 1008  BP: 128/80  General: pleasant lady, in no acute distress, alert and oriented x4 Left hip exam:  No swelling or erythema, normal range of motion with flexion, extension, abduction and adduction and internal and external rotation. Negative Faber's. No leg length discrepancy.  Strength: 3/5 left hip abduction and gluteus medius, 5/5 adduction, 5/5 extension and flexion Point tenderness at left ASIS joint and greater trochanter   Musculoskeletal Ultrasound: Left hip: small amount of fluid present over greater trochanter, fluid over posterior aspect of greater trochanter, fluid at ASIS   Left ASIS injection performed by Dr. Darrick Penna: Ultrasound guided.  Verbal consent obtained.  Area cleaned and prepped.  Depo medrol 20mg  with 3cc lidocaine 1.5inch needle 25gauge used. Injected in visualized ASIS joint.  Patient tolerated procedure well.     Assessment & Plan:  78 yo female with chronic left hip pain likely combination of ASIS joint inflammation and greater trochanteric bursitis as well as weak hip abduction.  - Received ASIS joint injection today - reviewed hip abduction exercises to do  with trainer and at home Follow up as needed and if not improved.

## 2013-02-10 NOTE — Assessment & Plan Note (Signed)
This is primarily localized to ASIS tendon insertion of sartorius  Injected today  Rest x 3 days and resume exercises  See how she responds and consider other TX if not getting good relief

## 2013-02-10 NOTE — Patient Instructions (Addendum)
It was nice to see you today! Let your trainer know that you would benefit from hip abduction exercises to help strengthen your hip abduction muscles.   After the injection, do not exercise for 2-3 days. If you have any redness, swelling or fever, please let us know.

## 2013-03-02 ENCOUNTER — Encounter: Payer: Self-pay | Admitting: Gynecology

## 2013-03-02 ENCOUNTER — Ambulatory Visit (INDEPENDENT_AMBULATORY_CARE_PROVIDER_SITE_OTHER): Payer: Medicare Other | Admitting: Gynecology

## 2013-03-02 VITALS — BP 130/78 | Ht 64.0 in | Wt 178.0 lb

## 2013-03-02 DIAGNOSIS — N952 Postmenopausal atrophic vaginitis: Secondary | ICD-10-CM

## 2013-03-02 DIAGNOSIS — M899 Disorder of bone, unspecified: Secondary | ICD-10-CM

## 2013-03-02 DIAGNOSIS — L290 Pruritus ani: Secondary | ICD-10-CM

## 2013-03-02 DIAGNOSIS — Z79890 Hormone replacement therapy (postmenopausal): Secondary | ICD-10-CM

## 2013-03-02 DIAGNOSIS — M8580 Other specified disorders of bone density and structure, unspecified site: Secondary | ICD-10-CM

## 2013-03-02 MED ORDER — NYSTATIN-TRIAMCINOLONE 100000-0.1 UNIT/GM-% EX OINT: TOPICAL_OINTMENT | Freq: Two times a day (BID) | CUTANEOUS | Status: DC

## 2013-03-02 NOTE — Progress Notes (Signed)
Hailey [name] 12/06/1934 [id number]        78 y.o.  G2P2002 for followup exam.  Former patient of Dr. Eda Paschal. Several issues noted below.  Past medical history,surgical history, medications, allergies, family history and social history were all reviewed and documented in the EPIC chart.  ROS:  Performed and pertinent positives and negatives are included in the history, assessment and plan .  Exam: Kim assistant Filed Vitals:   03/02/13 1440  BP: 130/78  Height: 5\' 4"  (1.626 m)  Weight: 178 lb (80.74 kg)   General appearance  Normal Skin grossly normal Head/Neck normal with no cervical or supraclavicular adenopathy thyroid normal Lungs  clear Cardiac RR, without RMG Abdominal  soft, nontender, without masses, organomegaly or hernia Breasts  examined lying and sitting without masses, retractions, discharge or axillary adenopathy. Bilateral reduction scars noted Pelvic  Ext/BUS/vagina  normal with atrophic changes  Adnexa  Without masses or tenderness    Anus and perineum  normal   Rectovaginal  normal sphincter tone without palpated masses or tenderness.    Assessment/Plan:  78 y.o. [id number] female for follow up exam.   1. Postmenopausal/atrophic genital changes/HRT. Patient is status post vaginal hysterectomy/BSO. Currently on Climara 0.05 patch. Has not tried to wean in a while.  I reviewed the whole issue of HRT with her to include the WHI study with increased risk of stroke, heart attack, DVT and breast cancer. The ACOG and NAMS statements for lowest dose for the shortest period of time reviewed. Transdermal versus oral first-pass effect benefit discussed.  She was having some vaginal dryness when she tried to wean previously. Recommended she try to wean now particularly since she is having A. fib with increased risk for blood clots. Assuming she does well after weaning them we'll follow. If she does become symptomatic she will call and will rediscuss options. Alternatives for  vaginal dryness to include Vagifem Osphena vaginal estrogen cream reviewed. 2. Vulvar/perianal itching. Intermittent occasional. Mytrex cream prescribed and will try this when necessary. Followup if this does not help. She does have a history of lichenoid changes on biopsy. May go to Temovate 0.05% cream if the above does not help. 3. Osteopenia. DEXA 12/2011 T score -1.3. FRAX 10%/1.9%. Increase calcium vitamin D reviewed. No history of treatment previously. Plan repeat DEXA next year T. Or interval. 4. Mammography 07/2012. Continue with annual mammography. SBE monthly reviewed. 5. Pap smear 2011. No Pap smear done today. No history of abnormal Pap smears previously.  Status post hysterectomy for benign indications. Discussed current screening guidelines. She is over the age of 79 and status post hysterectomy. Recommended further screening and she is comfortable with this. 6. Colonoscopy 01/2012. Repeat at their recommended interval. 7. Health maintenance. No lab work done as this is all done through her primary physician's office. Followup in one year, sooner if any issues particularly with ERT weaning.    Dara Lords MD, 3:25 PM 03/02/2013

## 2013-03-02 NOTE — Patient Instructions (Signed)
Wean off of the estrogen patch as we discussed. Call me if you have any issues with this. Tried the prescribed cream for perineal itching. Followup in 1 year, sooner as needed.

## 2013-03-03 ENCOUNTER — Encounter: Payer: Self-pay | Admitting: Gynecology

## 2013-03-04 ENCOUNTER — Encounter: Payer: Self-pay | Admitting: Gynecology

## 2013-04-23 ENCOUNTER — Other Ambulatory Visit: Payer: Self-pay | Admitting: *Deleted

## 2013-04-24 ENCOUNTER — Telehealth: Payer: Self-pay | Admitting: *Deleted

## 2013-04-24 NOTE — Telephone Encounter (Signed)
OK to fill this prescription with additional refills x3 Thank you!  

## 2013-04-24 NOTE — Telephone Encounter (Signed)
Rf req for Lorazepam 1 mg 1 po bid prn. #180. Last filled 12/16/12. Ok to Rf?

## 2013-04-27 MED ORDER — LORAZEPAM 1 MG PO TABS: 1.0000 mg | ORAL_TABLET | Freq: Two times a day (BID) | ORAL | Status: DC | PRN

## 2013-04-27 NOTE — Telephone Encounter (Signed)
Received fax pt requesting refill on her lorazepam...lmb

## 2013-04-27 NOTE — Telephone Encounter (Signed)
Done

## 2013-04-28 NOTE — Telephone Encounter (Signed)
Already done see previous msg 04/27/13. Closing phone note...lmb

## 2013-06-01 ENCOUNTER — Telehealth: Payer: Self-pay | Admitting: *Deleted

## 2013-06-01 MED ORDER — ESTRADIOL 0.0375 MG/24HR TD PTWK: 1.0000 | MEDICATED_PATCH | TRANSDERMAL | Status: DC

## 2013-06-01 NOTE — Telephone Encounter (Signed)
There are several options but I am not sure any of them would be good for her rather than going back on a lower dose of estrogen patch. We've previously reviewed the risks benefits and she is comfortable with restarting we can try 0.0375 patch see how she does with that and if that does not provide enough relief to go back on the 0.05 patch.

## 2013-06-01 NOTE — Telephone Encounter (Signed)
Pt has been off climara patch since June 2014, pt is c/o hot flashes during day and night, c/o trouble sleeping at night. She would like to know if anything could be given? Please advise

## 2013-06-01 NOTE — Telephone Encounter (Signed)
Pt informed with the below note, she will try the climara 0.0375 mg patch and follow up as needed.

## 2013-06-22 ENCOUNTER — Ambulatory Visit: Payer: Medicare Other | Admitting: Internal Medicine

## 2013-06-29 ENCOUNTER — Other Ambulatory Visit (INDEPENDENT_AMBULATORY_CARE_PROVIDER_SITE_OTHER): Payer: Medicare Other

## 2013-06-29 ENCOUNTER — Encounter: Payer: Self-pay | Admitting: Internal Medicine

## 2013-06-29 ENCOUNTER — Ambulatory Visit (INDEPENDENT_AMBULATORY_CARE_PROVIDER_SITE_OTHER): Payer: Medicare Other | Admitting: Internal Medicine

## 2013-06-29 VITALS — BP 118/70 | HR 76 | Temp 97.4°F | Resp 16 | Wt 175.0 lb

## 2013-06-29 DIAGNOSIS — T148XXA Other injury of unspecified body region, initial encounter: Secondary | ICD-10-CM

## 2013-06-29 DIAGNOSIS — I959 Hypotension, unspecified: Secondary | ICD-10-CM

## 2013-06-29 DIAGNOSIS — Z0000 Encounter for general adult medical examination without abnormal findings: Secondary | ICD-10-CM

## 2013-06-29 DIAGNOSIS — I10 Essential (primary) hypertension: Secondary | ICD-10-CM

## 2013-06-29 DIAGNOSIS — N259 Disorder resulting from impaired renal tubular function, unspecified: Secondary | ICD-10-CM

## 2013-06-29 DIAGNOSIS — F411 Generalized anxiety disorder: Secondary | ICD-10-CM

## 2013-06-29 DIAGNOSIS — Z136 Encounter for screening for cardiovascular disorders: Secondary | ICD-10-CM

## 2013-06-29 DIAGNOSIS — K219 Gastro-esophageal reflux disease without esophagitis: Secondary | ICD-10-CM

## 2013-06-29 LAB — LIPID PANEL
Cholesterol: 208 mg/dL — ABNORMAL HIGH (ref 0–200)
HDL: 97 mg/dL (ref >39.00)
Total CHOL/HDL Ratio: 2
Triglycerides: 138 mg/dL (ref 0.0–149.0)
VLDL: 27.6 mg/dL (ref 0.0–40.0)

## 2013-06-29 LAB — CBC WITH DIFFERENTIAL/PLATELET
Basophils Absolute: 0 10*3/uL (ref 0.0–0.1)
Basophils Relative: 0.5 % (ref 0.0–3.0)
Eosinophils Absolute: 0.5 10*3/uL (ref 0.0–0.7)
Eosinophils Relative: 5.8 % — ABNORMAL HIGH (ref 0.0–5.0)
HCT: 38.5 % (ref 36.0–46.0)
Hemoglobin: 13 g/dL (ref 12.0–15.0)
Lymphocytes Relative: 23.9 % (ref 12.0–46.0)
Lymphs Abs: 1.8 10*3/uL (ref 0.7–4.0)
MCHC: 33.7 g/dL (ref 30.0–36.0)
MCV: 92.4 fl (ref 78.0–100.0)
Monocytes Absolute: 0.7 10*3/uL (ref 0.1–1.0)
Monocytes Relative: 9.5 % (ref 3.0–12.0)
Neutro Abs: 4.7 10*3/uL (ref 1.4–7.7)
Neutrophils Relative %: 60.3 % (ref 43.0–77.0)
Platelets: 245 10*3/uL (ref 150.0–400.0)
RBC: 4.16 Mil/uL (ref 3.87–5.11)
RDW: 13.9 % (ref 11.5–14.6)
WBC: 7.7 10*3/uL (ref 4.5–10.5)

## 2013-06-29 LAB — BASIC METABOLIC PANEL
BUN: 23 mg/dL (ref 6–23)
CO2: 25 mEq/L (ref 19–32)
Calcium: 9 mg/dL (ref 8.4–10.5)
Chloride: 104 mEq/L (ref 96–112)
Creatinine, Ser: 1.3 mg/dL — ABNORMAL HIGH (ref 0.4–1.2)
GFR: 42.26 mL/min — ABNORMAL LOW (ref >60.00)
Glucose, Bld: 98 mg/dL (ref 70–99)
Potassium: 4.6 mEq/L (ref 3.5–5.1)
Sodium: 137 mEq/L (ref 135–145)

## 2013-06-29 LAB — LDL CHOLESTEROL, DIRECT: Direct LDL: 89.1 mg/dL

## 2013-06-29 LAB — TSH: TSH: 1.65 u[IU]/mL (ref 0.35–5.50)

## 2013-06-29 NOTE — Addendum Note (Signed)
Addended by: Tresa Garter on: 06/29/2013 03:53 PM   Modules accepted: Orders

## 2013-06-29 NOTE — Assessment & Plan Note (Signed)
Continue with current prescription therapy as reflected on the Med list.  

## 2013-06-29 NOTE — Progress Notes (Signed)
   Subjective:   HPI  The patient is here for a wellness exam. The patient has been doing well overall without major physical or psychological issues going on lately.  The patient needs to address  chronic hypertension that has been well controlled with medicine  F/u bruising - better F/u elev. BP  F/u R hip pain.  Review of Systems  Constitutional: Negative for appetite change and fatigue.  HENT: Negative for tinnitus.   Eyes: Negative for pain.  Respiratory: Negative for chest tightness and shortness of breath.   Cardiovascular: Negative for chest pain.  Musculoskeletal: Negative for back pain and neck pain.  Neurological: Negative for dizziness, speech difficulty and weakness.  Hematological: Negative for adenopathy. Bruises/bleeds easily.  Psychiatric/Behavioral: Negative for behavioral problems and dysphoric mood.   Wt Readings from Last 3 Encounters:  06/29/13 175 lb (79.379 kg)  03/02/13 178 lb (80.74 kg)  02/10/13 170 lb (77.111 kg)       BP Readings from Last 3 Encounters:  06/29/13 118/70  03/02/13 130/78  02/10/13 128/80    Objective:   Physical Exam  Constitutional: She appears well-developed and well-nourished. No distress.  HENT:  Head: Normocephalic.  Right Ear: External ear normal.  Left Ear: External ear normal.  Nose: Nose normal.  Mouth/Throat: Oropharynx is clear and moist.  Eyes: Conjunctivae are normal. Pupils are equal, round, and reactive to light. Right eye exhibits no discharge. Left eye exhibits no discharge.  Neck: Normal range of motion. Neck supple. No JVD present. No tracheal deviation present. No thyromegaly present.  Cardiovascular: Normal rate and regular rhythm.   Murmur (1-2/6) heard. Pulmonary/Chest: No stridor. No respiratory distress. She has no wheezes.  Abdominal: Soft. Bowel sounds are normal. She exhibits no distension and no mass. There is no tenderness. There is no rebound and no guarding.  Musculoskeletal: She exhibits  no edema and no tenderness.  Lymphadenopathy:    She has no cervical adenopathy.  Neurological: She displays normal reflexes. No cranial nerve deficit. She exhibits normal muscle tone. Coordination normal.  Skin: No rash noted. No erythema.  Tanned.   Psychiatric: She has a normal mood and affect. Her behavior is normal. Judgment and thought content normal.  H-P (+) on R     Lab Results  Component Value Date   WBC 9.2 02/03/2013   HGB 13.0 02/03/2013   HCT 37.4 02/03/2013   PLT 214 02/03/2013   GLUCOSE 99 02/03/2013   CHOL 173 10/31/2011   TRIG 118.0 10/31/2011   HDL 89.70 10/31/2011   LDLCALC 60 10/31/2011   ALT 15 02/03/2013   AST 27 02/03/2013   NA 136 02/03/2013   K 4.4 02/03/2013   CL 103 02/03/2013   CREATININE 1.24* 02/03/2013   BUN 25* 02/03/2013   CO2 19 02/03/2013   TSH 2.74 11/10/2012   INR 1.0 01/26/2011       Assessment & Plan:

## 2013-06-29 NOTE — Assessment & Plan Note (Signed)
Due to meds CBC

## 2013-06-30 ENCOUNTER — Telehealth: Payer: Self-pay

## 2013-06-30 NOTE — Telephone Encounter (Signed)
Patient informed of results.  

## 2013-06-30 NOTE — Telephone Encounter (Signed)
Message copied by Eulis Manly on Tue Jun 30, 2013  4:20 PM ------      Message from: Tresa Garter      Created: Mon Jun 29, 2013  9:29 PM       Misty Stanley, please, inform patient that all labs are OK      Thank you!       ------

## 2013-07-01 ENCOUNTER — Other Ambulatory Visit: Payer: Self-pay | Admitting: Internal Medicine

## 2013-07-01 ENCOUNTER — Encounter: Payer: Self-pay | Admitting: *Deleted

## 2013-07-08 ENCOUNTER — Ambulatory Visit (INDEPENDENT_AMBULATORY_CARE_PROVIDER_SITE_OTHER): Payer: Medicare Other | Admitting: Gynecology

## 2013-07-08 ENCOUNTER — Encounter: Payer: Self-pay | Admitting: Gynecology

## 2013-07-08 DIAGNOSIS — N899 Noninflammatory disorder of vagina, unspecified: Secondary | ICD-10-CM

## 2013-07-08 DIAGNOSIS — N898 Other specified noninflammatory disorders of vagina: Secondary | ICD-10-CM

## 2013-07-08 DIAGNOSIS — L940 Localized scleroderma [morphea]: Secondary | ICD-10-CM

## 2013-07-08 DIAGNOSIS — L900 Lichen sclerosus et atrophicus: Secondary | ICD-10-CM

## 2013-07-08 DIAGNOSIS — R300 Dysuria: Secondary | ICD-10-CM

## 2013-07-08 LAB — URINALYSIS W MICROSCOPIC + REFLEX CULTURE
Crystals: NONE SEEN
Glucose, UA: NEGATIVE mg/dL
Ketones, ur: NEGATIVE mg/dL
Leukocytes, UA: NEGATIVE
Nitrite: NEGATIVE
Protein, ur: NEGATIVE mg/dL
Specific Gravity, Urine: 1.02 (ref 1.005–1.030)
Urobilinogen, UA: 0.2 mg/dL (ref 0.0–1.0)
WBC, UA: NONE SEEN WBC/hpf (ref <3)
pH: 6 (ref 5.0–8.0)

## 2013-07-08 LAB — WET PREP FOR TRICH, YEAST, CLUE
Clue Cells Wet Prep HPF POC: NONE SEEN
Trich, Wet Prep: NONE SEEN
WBC, Wet Prep HPF POC: NONE SEEN
Yeast Wet Prep HPF POC: NONE SEEN

## 2013-07-08 MED ORDER — CLOBETASOL PROPIONATE 0.05 % EX CREA: TOPICAL_CREAM | CUTANEOUS | Status: DC

## 2013-07-08 NOTE — Progress Notes (Signed)
Patient presents complaining of vaginal irritation and discomfort when urine hits her vulva. She has a long history of lichen sclerosis. She had several biopsies per Dr. Eda Paschal which is lichenoid changes although not definitive diagnoses. Her exam has always been consistent with lichen sclerosis. Had tried the Mytrex cream which seems to help but does not totally take care of the symptoms. No real discharge.  Exam with Berenice Bouton External BUS vagina with intense vulvar irritation and cracking with white skin changes consistent with classic lichen sclerosis from clitoral hood down to the perianal region. Bimanual without masses or tenderness.  Assessment and plan: Lichen sclerosis. Temovate 0.05% cream nightly x1 month with followup exam at that time. Discussed in detail the diagnosis of lichen sclerosis and the chronicity of the disease. The need to intermittently use Temovate cream to address symptoms discussed. Patient will followup in a month and we'll reexamine her and hopefully she will have a beneficial response.

## 2013-07-08 NOTE — Patient Instructions (Signed)
Use the clobetasol (Temovate) cream nightly. Followup for reexamination in one month.

## 2013-07-09 ENCOUNTER — Other Ambulatory Visit: Payer: Self-pay

## 2013-07-09 DIAGNOSIS — Z1231 Encounter for screening mammogram for malignant neoplasm of breast: Secondary | ICD-10-CM

## 2013-07-10 ENCOUNTER — Other Ambulatory Visit: Payer: Self-pay | Admitting: *Deleted

## 2013-07-10 LAB — URINE CULTURE: Colony Count: >=100000

## 2013-07-10 MED ORDER — AMPICILLIN 500 MG PO CAPS: 500.0000 mg | ORAL_CAPSULE | Freq: Four times a day (QID) | ORAL | Status: DC

## 2013-07-21 ENCOUNTER — Telehealth: Payer: Self-pay | Admitting: Interventional Cardiology

## 2013-07-21 NOTE — Telephone Encounter (Signed)
returned pt call pt sts that she went to the gym yesterday and climbed a flight of stairs. pt sts that she could not catch her breath.pt sts that her bpm was ranging 110-130bpm. pt sts that she was experiencing palpitations.no chest pain or chest pressure.pt sts that she is experiencing sob more frequently.adv pt I would talk to Dr.Smith about her symptoms and call her back.pt verbalized understanding

## 2013-07-21 NOTE — Telephone Encounter (Signed)
returned pt call.pt given Dr.Smith instructions to increase metoprolo to 1 1/2 tablet bid for a total of 75 mg daily, try to monitor her heartrate and call back on thursday with an update of how shes feeling.pt verbalized understanding.

## 2013-07-21 NOTE — Telephone Encounter (Signed)
Follow Up ° ° ° ° ° ° ° °Pt returning Lisa's call. °

## 2013-07-21 NOTE — Telephone Encounter (Signed)
New Problem  Pt states that she has exhibited heavy breathing (not S.O.B) w/ Exertion// Breathing discomfort// Please call.

## 2013-07-23 ENCOUNTER — Telehealth: Payer: Self-pay | Admitting: Interventional Cardiology

## 2013-07-23 NOTE — Telephone Encounter (Signed)
error 

## 2013-07-23 NOTE — Telephone Encounter (Signed)
Follow UP:  Pt states she is calling Lisa back. Please advise

## 2013-07-27 NOTE — Telephone Encounter (Signed)
returned pt call. pt metoprolol was increased to 37.5mg  bid. pt sts that she is doing much better. her heart rate is between 68-75 bpm, and her sob has improved. pt would like to know how long she will need to stay on this dose.pt also asked if she is ablation.adv pt I would discuss with Dr.Smith ans call her back. Pt verbalized understanding

## 2013-07-30 NOTE — Telephone Encounter (Signed)
Returned call to pt. She states her heart rate is much better controlled on the increased dose of Metoprolol. Denies any shortness of breath.  She would like to know if Dr. Katrinka Blazing thinks she would be a candidate for ablation.  Forwarded to Dr. Katrinka Blazing for review.  Mylo Red RN

## 2013-07-30 NOTE — Telephone Encounter (Signed)
Follow up    Pt want to farther discuss what you spoke about earlier in the week.Hailey [name]

## 2013-07-31 NOTE — Telephone Encounter (Signed)
pt given Dr.Smith Recommendation.pt is not yet a canidat for an ablation. That should be a latter option if therapy fails.pt verbalized understanding

## 2013-07-31 NOTE — Telephone Encounter (Signed)
Not yet. That should be a latter option if therapy fails

## 2013-08-03 ENCOUNTER — Other Ambulatory Visit: Payer: Self-pay | Admitting: Interventional Cardiology

## 2013-08-11 ENCOUNTER — Ambulatory Visit (INDEPENDENT_AMBULATORY_CARE_PROVIDER_SITE_OTHER): Payer: Medicare Other | Admitting: Gynecology

## 2013-08-11 ENCOUNTER — Encounter: Payer: Self-pay | Admitting: Gynecology

## 2013-08-11 DIAGNOSIS — L940 Localized scleroderma [morphea]: Secondary | ICD-10-CM

## 2013-08-11 DIAGNOSIS — L900 Lichen sclerosus et atrophicus: Secondary | ICD-10-CM

## 2013-08-11 DIAGNOSIS — R8290 Unspecified abnormal findings in urine: Secondary | ICD-10-CM

## 2013-08-11 DIAGNOSIS — R82998 Other abnormal findings in urine: Secondary | ICD-10-CM

## 2013-08-11 DIAGNOSIS — N952 Postmenopausal atrophic vaginitis: Secondary | ICD-10-CM

## 2013-08-11 MED ORDER — ESTRADIOL 0.0375 MG/24HR TD PTWK: 0.0375 mg | MEDICATED_PATCH | TRANSDERMAL | Status: DC

## 2013-08-11 NOTE — Progress Notes (Signed)
Patient presents in followup of her vulvar irritation. She has a chronic dermatitis with biopsy showing a lichenoid pattern but no true lichen sclerosis. Had been using Mytrex cream with some success. Given a prescription for Temovate 0.5% cream and notes that she feels much better having used it nightly for one month. Also noted some odor to her urine intermittently. No dysuria frequency urgency.  Exam was Administrator, [employer] vagina with whitish skin changes. Clearly to the perianal region. No inflammatory changes, rashes, skin cracking noted.  Assessment and plan: Good response to Temovate cream. We'll wean herself off of this now and use it when necessary for irritation. We'll check baseline urinalysis given complaints of intermittent urine odor no other signs to suggest UTI. She is on the Climara 0.0375 patches. Had done the 0.05 mg patches and weaned herself off but had acceptable hot flashes. She called and we started her on a lower patches she seems to be doing better with this without significant hot flushes night sweats. We discussed her history of atrial fibrillation on anticoagulation and the risks of estrogen replacement to possibly increase the risk of DVT pulmonary embolus stroke heart attack. Benefits of transdermal as well as already being anticoagulated again discussed with her as has been discussed with her by Dr. Eda Paschal in the past. Patient's comfortable continuing and is going to stay on the low dose estrogen patch for now.

## 2013-08-11 NOTE — Patient Instructions (Signed)
Continue on the estrogen patches as we discussed. Use the Temovate steroid cream as needed for vulvar irritation.

## 2013-08-12 ENCOUNTER — Ambulatory Visit
Admission: RE | Admit: 2013-08-12 | Discharge: 2013-08-12 | Disposition: A | Discharge status: Home / Self Care | Payer: Medicare Other | Source: Ambulatory Visit

## 2013-08-12 DIAGNOSIS — Z1231 Encounter for screening mammogram for malignant neoplasm of breast: Secondary | ICD-10-CM

## 2013-08-12 LAB — URINALYSIS W MICROSCOPIC + REFLEX CULTURE
Bilirubin Urine: NEGATIVE
Casts: NONE SEEN
Crystals: NONE SEEN
Glucose, UA: NEGATIVE mg/dL
Ketones, ur: NEGATIVE mg/dL
Leukocytes, UA: NEGATIVE
Nitrite: POSITIVE — AB
Protein, ur: NEGATIVE mg/dL
Specific Gravity, Urine: 1.016 (ref 1.005–1.030)
Urobilinogen, UA: 0.2 mg/dL (ref 0.0–1.0)
pH: 5.5 (ref 5.0–8.0)

## 2013-08-13 ENCOUNTER — Other Ambulatory Visit: Payer: Self-pay | Admitting: Gynecology

## 2013-08-13 LAB — URINE CULTURE: Colony Count: >=100000

## 2013-08-13 MED ORDER — NITROFURANTOIN MONOHYD MACRO 100 MG PO CAPS: 100.0000 mg | ORAL_CAPSULE | Freq: Two times a day (BID) | ORAL | Status: DC

## 2013-08-29 ENCOUNTER — Ambulatory Visit: Payer: Medicare Other | Admitting: Internal Medicine

## 2013-08-31 ENCOUNTER — Telehealth: Payer: Self-pay | Admitting: *Deleted

## 2013-08-31 NOTE — Telephone Encounter (Signed)
ToRoma Schanz Fax: [phone] From: [facility] Date/ Time: 08/28/2013 9:20 PM Taken By: Forbes Cellar, CSR Caller: Derinda Facility: not collected Patient: Hailey [name], Hailey [name] DOB: Sep 02, 1935 Phone: [phone] Reason for Call: See info below Regarding Appointment: Yes Appt Date: 08/29/2013 Appt Time: 9:15:00 AM Provider: Rene Paci (Adults only) Reason: Cancel Appointment Details: Feeling better Outcome: Cancelled appointment in EPIC [facility])

## 2013-09-01 ENCOUNTER — Other Ambulatory Visit: Payer: Self-pay | Admitting: Diagnostic Radiology

## 2013-09-01 ENCOUNTER — Emergency Department (HOSPITAL_COMMUNITY): Payer: Medicare Other

## 2013-09-01 ENCOUNTER — Encounter (HOSPITAL_COMMUNITY): Payer: Self-pay | Admitting: Emergency Medicine

## 2013-09-01 ENCOUNTER — Emergency Department (HOSPITAL_COMMUNITY)
Admission: EM | Admit: 2013-09-01 | Discharge: 2013-09-01 | Disposition: A | Discharge status: Home / Self Care | Payer: Medicare Other | Attending: Emergency Medicine | Admitting: Emergency Medicine

## 2013-09-01 DIAGNOSIS — Z792 Long term (current) use of antibiotics: Secondary | ICD-10-CM | POA: Insufficient documentation

## 2013-09-01 DIAGNOSIS — K219 Gastro-esophageal reflux disease without esophagitis: Secondary | ICD-10-CM | POA: Insufficient documentation

## 2013-09-01 DIAGNOSIS — F411 Generalized anxiety disorder: Secondary | ICD-10-CM | POA: Insufficient documentation

## 2013-09-01 DIAGNOSIS — I10 Essential (primary) hypertension: Secondary | ICD-10-CM | POA: Insufficient documentation

## 2013-09-01 DIAGNOSIS — I4891 Unspecified atrial fibrillation: Secondary | ICD-10-CM | POA: Insufficient documentation

## 2013-09-01 DIAGNOSIS — Z87891 Personal history of nicotine dependence: Secondary | ICD-10-CM | POA: Insufficient documentation

## 2013-09-01 DIAGNOSIS — IMO0002 Reserved for concepts with insufficient information to code with codable children: Secondary | ICD-10-CM | POA: Insufficient documentation

## 2013-09-01 DIAGNOSIS — Z860100 Personal history of colon polyps, unspecified: Secondary | ICD-10-CM | POA: Insufficient documentation

## 2013-09-01 DIAGNOSIS — Z8739 Personal history of other diseases of the musculoskeletal system and connective tissue: Secondary | ICD-10-CM | POA: Insufficient documentation

## 2013-09-01 DIAGNOSIS — R55 Syncope and collapse: Secondary | ICD-10-CM

## 2013-09-01 DIAGNOSIS — Z79899 Other long term (current) drug therapy: Secondary | ICD-10-CM | POA: Insufficient documentation

## 2013-09-01 DIAGNOSIS — Z7901 Long term (current) use of anticoagulants: Secondary | ICD-10-CM | POA: Insufficient documentation

## 2013-09-01 DIAGNOSIS — Z872 Personal history of diseases of the skin and subcutaneous tissue: Secondary | ICD-10-CM | POA: Insufficient documentation

## 2013-09-01 LAB — BASIC METABOLIC PANEL
BUN: 21 mg/dL (ref 6–23)
CO2: 24 mEq/L (ref 19–32)
Calcium: 9 mg/dL (ref 8.4–10.5)
Chloride: 103 mEq/L (ref 96–112)
Creatinine, Ser: 1.04 mg/dL (ref 0.50–1.10)
GFR calc Af Amer: 59 mL/min — ABNORMAL LOW (ref >90)
GFR calc non Af Amer: 51 mL/min — ABNORMAL LOW (ref >90)
Glucose, Bld: 102 mg/dL — ABNORMAL HIGH (ref 70–99)
Potassium: 4.8 mEq/L (ref 3.5–5.1)
Sodium: 138 mEq/L (ref 135–145)

## 2013-09-01 LAB — CBC
HCT: 39.9 % (ref 36.0–46.0)
Hemoglobin: 13.8 g/dL (ref 12.0–15.0)
MCH: 31.4 pg (ref 26.0–34.0)
MCHC: 34.6 g/dL (ref 30.0–36.0)
MCV: 90.7 fL (ref 78.0–100.0)
Platelets: 242 10*3/uL (ref 150–400)
RBC: 4.4 MIL/uL (ref 3.87–5.11)
RDW: 14.9 % (ref 11.5–15.5)
WBC: 14.1 10*3/uL — ABNORMAL HIGH (ref 4.0–10.5)

## 2013-09-01 LAB — POCT I-STAT, CHEM 8
BUN: 21 mg/dL (ref 6–23)
Calcium, Ion: 1.21 mmol/L (ref 1.13–1.30)
Chloride: 104 mEq/L (ref 96–112)
Creatinine, Ser: 1.1 mg/dL (ref 0.50–1.10)
Glucose, Bld: 104 mg/dL — ABNORMAL HIGH (ref 70–99)
HCT: 42 % (ref 36.0–46.0)
Hemoglobin: 14.3 g/dL (ref 12.0–15.0)
Potassium: 4.7 mEq/L (ref 3.5–5.1)
Sodium: 137 mEq/L (ref 135–145)
TCO2: 25 mmol/L (ref 0–100)

## 2013-09-01 LAB — GLUCOSE, CAPILLARY: Glucose-Capillary: 87 mg/dL (ref 70–99)

## 2013-09-01 MED ORDER — SODIUM CHLORIDE 0.9 % IV BOLUS (SEPSIS)
250.0000 mL | Freq: Once | INTRAVENOUS | Status: AC
  Administered 2013-09-01: 250 mL via INTRAVENOUS

## 2013-09-01 NOTE — ED Provider Notes (Signed)
CSN: [id number]     Arrival date & time    History   First MD Initiated Contact with Patient 09/01/13 1247     Chief Complaint  Patient presents with  . Near Syncope   (Consider location/radiation/quality/duration/timing/severity/associated sxs/prior Treatment) HPI Patient presents emergency department with a near syncopal episode that occurred earlier this morning.  Patient was at home when she started feeling like she may pass out.  She states her symptoms relieved by laying down.  The patient, states she's had this happen in the past, when she's become dehydrated.  Patient, states the last 2, weeks she's had an upper respiratory illness.  The patient, states, that she did not take any medications prior to arrival.  Patient denies chest pain, shortness of breath, nausea, vomiting, blurred vision, headache, dysuria, back pain, neck pain, rash, fever, or syncope.  The patient, states, that she's had diarrhea several times since last night Past Medical History  Diagnosis Date  . History of colon polyps   . GERD (gastroesophageal reflux disease)   . HTN (hypertension)   . SVT (supraventricular tachycardia)     brief history  . PAC (premature atrial contraction)     Symptomatiic  . Anxiety   . Allergic rhinitis   . IBS (irritable bowel syndrome)     constipation predominant - Dr Kinnie Scales  . Diverticulosis of colon   . Hip bursitis 2010    Dr Wyline Mood, Post op seroma  . Osteopenia 12/2011    T score -1.3 FRAX 10%/1.9%  . Atrial fibrillation   . Lichen sclerosus    Past Surgical History  Procedure Laterality Date  . Bladder surgery    . Breast reduction surgery    . Appendectomy    . Troch bursa resection  2010  . Vaginal hysterectomy      BSO  . Oophorectomy      BSO  . Toe surgery     Family History  Problem Relation Age of Onset  . Colon cancer Mother   . Cancer Father     Prostate  . Diabetes Father   . Cancer Brother     Prostate   History  Substance Use Topics  .  Smoking status: Former Smoker    Quit date: 09/11/1975  . Smokeless tobacco: Never Used  . Alcohol Use: 3.0 oz/week    6 drink(s) per week   OB History   Grav Para Term Preterm Abortions TAB SAB Ect Mult Living   2 2 2       2      Review of Systems All other systems negative except as documented in the HPI. All pertinent positives and negatives as reviewed in the HPI. Allergies  Meloxicam and Sulfamethoxazole-trimethoprim  Home Medications   Current Outpatient Rx  Name  Route  Sig  Dispense  Refill  . ampicillin (PRINCIPEN) 500 MG capsule   Oral   Take 1 capsule (500 mg total) by mouth 4 (four) times daily.   20 capsule   0   . apixaban (ELIQUIS) 5 MG TABS tablet   Oral   Take by mouth 2 (two) times daily.         . clobetasol cream (TEMOVATE) 0.05 %      Apply nightly as directed   30 g   1   . DULoxetine (CYMBALTA) 60 MG capsule      TAKE 1 CAPSULE ONCE DAILY FOR DEPRESSION.   30 capsule   5   . esomeprazole (NEXIUM)  40 MG capsule   Oral   Take 40 mg by mouth daily before breakfast.           . estradiol (CLIMARA - DOSED IN MG/24 HR) 0.0375 mg/24hr patch   Transdermal   Place 1 patch (0.0375 mg total) onto the skin once a week.   4 patch   2   . estradiol (CLIMARA - DOSED IN MG/24 HR) 0.0375 mg/24hr patch   Transdermal   Place 1 patch (0.0375 mg total) onto the skin once a week.   4 patch   12   . fexofenadine (ALLEGRA) 180 MG tablet   Oral   Take 1 tablet (180 mg total) by mouth daily.         . fluticasone (FLONASE) 50 MCG/ACT nasal spray   Nasal   Place 2 sprays into the nose daily.   16 g   2   . LORazepam (ATIVAN) 1 MG tablet   Oral   Take 1 tablet (1 mg total) by mouth 2 (two) times daily as needed for anxiety.   180 tablet   3   . metoprolol succinate (TOPROL-XL) 25 MG 24 hr tablet   Oral   Take 1.5 tablets (37.5 mg total) by mouth 2 (two) times daily.   90 tablet   5   . Multiple Vitamins-Minerals  (MULTIVITAMIN,TX-MINERALS) tablet   Oral   Take 1 tablet by mouth daily.           . nitrofurantoin, macrocrystal-monohydrate, (MACROBID) 100 MG capsule   Oral   Take 1 capsule (100 mg total) by mouth 2 (two) times daily.   14 capsule   0     Patient is out of town x one week so will not be p ...   . Probiotic Product (SUPER PROBIOTIC PO)   Oral   Take 1 tablet by mouth daily.           BP 111/89  Pulse 75  Temp(Src) 98.3 F (36.8 C)  Resp 18  SpO2 96% Physical Exam  Nursing note and vitals reviewed. Constitutional: She is oriented to person, place, and time. She appears well-developed and well-nourished.  HENT:  Head: Normocephalic and atraumatic.  Mouth/Throat: Oropharynx is clear and moist.  Eyes: Pupils are equal, round, and reactive to light.  Neck: Normal range of motion. Neck supple.  Cardiovascular: Normal rate, regular rhythm and normal heart sounds.  Exam reveals no gallop.   No murmur heard. Pulmonary/Chest: Effort normal and breath sounds normal. No respiratory distress.  Neurological: She is alert and oriented to person, place, and time. She exhibits normal muscle tone. Coordination normal.  Skin: Skin is warm and dry.    ED Course  Procedures (including critical care time) Labs Review Labs Reviewed  CBC - Abnormal; Notable for the following:    WBC 14.1 (*)    All other components within normal limits  BASIC METABOLIC PANEL - Abnormal; Notable for the following:    Glucose, Bld 102 (*)    GFR calc non Af Amer 51 (*)    GFR calc Af Amer 59 (*)    All other components within normal limits  POCT I-STAT, CHEM 8 - Abnormal; Notable for the following:    Glucose, Bld 104 (*)    All other components within normal limits  GLUCOSE, CAPILLARY   Imaging Review Dg Chest 2 View  09/01/2013   CLINICAL DATA:  Dizziness and near syncope  EXAM: CHEST  2 VIEW  COMPARISON:  02/03/2013  FINDINGS: Heart size and vascularity are normal. Lungs are clear. Negative  for mass, pneumonia, or effusion.  IMPRESSION: No active cardiopulmonary disease.   Electronically Signed   By: Marlan Palau M.D.   On: 09/01/2013 14:11    EKG Interpretation    Date/Time:  Tuesday September 01 2013 12:50:05 EST Ventricular Rate:  69 PR Interval:    QRS Duration: 85 QT Interval:  433 QTC Calculation: 464 R Axis:   -30 Text Interpretation:  Atrial fibrillation Left axis deviation Nonspecific ST abnormality Confirmed by Denton Lank  MD, KEVIN ([id number]) on 09/01/2013 1:07:43 PM           Patient is feeling better here in the emergency department.  Patient stated IV fluids.  She has also eaten and had oral fluids.  Patient, states she's had this happen to her in the past.  Patient is advised followup with her primary care Dr. told to return here as needed.  Ask her to increase her fluid intake, as well   The patient has been seen by the attending Physician.     Carlyle Dolly, PA-C 09/01/13 1558

## 2013-09-01 NOTE — ED Notes (Signed)
Pt comfortable with d/c and f/u instructions. No Prescriptions. 

## 2013-09-01 NOTE — ED Notes (Signed)
Dr. Steinl at bedside 

## 2013-09-01 NOTE — ED Notes (Signed)
Pt.ambulated to the restroom .and back to the room.pt.heartrate was at 82 o2 100%

## 2013-09-01 NOTE — ED Notes (Addendum)
Pt brought from [facility] with c/o diarrhea and lightheadedness with standing. Pt awoke at 0300 this morning with diarrhea and has had multiple BM's today. Pt called EMS because she became extremely dizzy/lightheaded while going from sitting to standing to make breakfast at 0800 and symptoms have not resolved. Pt alert and oriented x4, in NAD

## 2013-09-07 ENCOUNTER — Telehealth: Payer: Self-pay | Admitting: Internal Medicine

## 2013-09-07 ENCOUNTER — Ambulatory Visit (INDEPENDENT_AMBULATORY_CARE_PROVIDER_SITE_OTHER): Payer: Medicare Other | Admitting: Internal Medicine

## 2013-09-07 ENCOUNTER — Encounter: Payer: Self-pay | Admitting: Internal Medicine

## 2013-09-07 VITALS — BP 120/80 | HR 76 | Temp 96.6°F | Resp 16

## 2013-09-07 DIAGNOSIS — J45909 Unspecified asthma, uncomplicated: Secondary | ICD-10-CM

## 2013-09-07 DIAGNOSIS — J9801 Acute bronchospasm: Secondary | ICD-10-CM

## 2013-09-07 DIAGNOSIS — R05 Cough: Secondary | ICD-10-CM

## 2013-09-07 DIAGNOSIS — R059 Cough, unspecified: Secondary | ICD-10-CM

## 2013-09-07 DIAGNOSIS — J069 Acute upper respiratory infection, unspecified: Secondary | ICD-10-CM

## 2013-09-07 MED ORDER — CEFUROXIME AXETIL 500 MG PO TABS: ORAL_TABLET | ORAL | Status: DC

## 2013-09-07 MED ORDER — PROMETHAZINE-CODEINE 6.25-10 MG/5ML PO SYRP: 5.0000 mL | ORAL_SOLUTION | ORAL | Status: DC | PRN

## 2013-09-07 MED ORDER — FLUTICASONE FUROATE-VILANTEROL 100-25 MCG/INH IN AEPB: 1.0000 | INHALATION_SPRAY | Freq: Every day | RESPIRATORY_TRACT | Status: DC

## 2013-09-07 MED ORDER — METHYLPREDNISOLONE ACETATE 80 MG/ML IJ SUSP
80.0000 mg | Freq: Once | INTRAMUSCULAR | Status: AC
  Administered 2013-09-07: 80 mg via INTRAMUSCULAR

## 2013-09-07 NOTE — Progress Notes (Signed)
Pre visit review using our clinic review tool, if applicable. No additional management support is needed unless otherwise documented below in the visit note. 

## 2013-09-07 NOTE — Telephone Encounter (Signed)
Pt is requesting to come today instead of tomorrow.  She has congestion and wheezing.  Not appts here or any other [place].

## 2013-09-07 NOTE — Patient Instructions (Signed)
Use over-the-counter  "cold" medicines  such as  "Afrin" nasal spray for nasal congestion as directed instead. Use" Delsym" or" Robitussin" cough syrup varietis for cough.  You can use plain "Tylenol" or "Advil" for fever, chills and achyness.  Please, make an appointment if you are not better or if you're worse.  

## 2013-09-07 NOTE — Assessment & Plan Note (Signed)
Start Breo Depomedrol 80 mg im

## 2013-09-07 NOTE — Progress Notes (Signed)
   Subjective:   Cough This is a new problem. The current episode started 1 to 4 weeks ago. Pertinent negatives include no chest pain or shortness of breath.   F/u ER visit for hypotension   The patient needs to address  chronic hypertension that has been well controlled with medicine  F/u bruising - better F/u elev. BP  F/u R hip pain.  Review of Systems  Constitutional: Negative for appetite change and fatigue.  HENT: Negative for tinnitus.   Eyes: Negative for pain.  Respiratory: Positive for cough. Negative for chest tightness and shortness of breath.   Cardiovascular: Negative for chest pain.  Musculoskeletal: Negative for back pain and neck pain.  Neurological: Negative for dizziness, speech difficulty and weakness.  Hematological: Negative for adenopathy. Bruises/bleeds easily.  Psychiatric/Behavioral: Negative for behavioral problems and dysphoric mood.   Wt Readings from Last 3 Encounters:  06/29/13 175 lb (79.379 kg)  03/02/13 178 lb (80.74 kg)  02/10/13 170 lb (77.111 kg)       BP Readings from Last 3 Encounters:  09/07/13 120/80  09/01/13 114/67  06/29/13 118/70    Objective:   Physical Exam  Constitutional: She appears well-developed and well-nourished. No distress.  HENT:  Head: Normocephalic.  Right Ear: External ear normal.  Left Ear: External ear normal.  Nose: Nose normal.  Mouth/Throat: Oropharynx is clear and moist.  Eyes: Conjunctivae are normal. Pupils are equal, round, and reactive to light. Right eye exhibits no discharge. Left eye exhibits no discharge.  Neck: Normal range of motion. Neck supple. No JVD present. No tracheal deviation present. No thyromegaly present.  Cardiovascular: Normal rate and regular rhythm.   Murmur (1-2/6) heard. Pulmonary/Chest: No stridor. No respiratory distress. She has no wheezes.  Abdominal: Soft. Bowel sounds are normal. She exhibits no distension and no mass. There is no tenderness. There is no rebound and  no guarding.  Musculoskeletal: She exhibits no edema and no tenderness.  Lymphadenopathy:    She has no cervical adenopathy.  Neurological: She displays normal reflexes. No cranial nerve deficit. She exhibits normal muscle tone. Coordination normal.  Skin: No rash noted. No erythema.  Tanned.   Psychiatric: She has a normal mood and affect. Her behavior is normal. Judgment and thought content normal.       Lab Results  Component Value Date   WBC 14.1* 09/01/2013   HGB 14.3 09/01/2013   HCT 42.0 09/01/2013   PLT 242 09/01/2013   GLUCOSE 104* 09/01/2013   CHOL 208* 06/29/2013   TRIG 138.0 06/29/2013   HDL 97.00 06/29/2013   LDLDIRECT 89.1 06/29/2013   LDLCALC 60 10/31/2011   ALT 15 02/03/2013   AST 27 02/03/2013   NA 137 09/01/2013   K 4.7 09/01/2013   CL 104 09/01/2013   CREATININE 1.10 09/01/2013   BUN 21 09/01/2013   CO2 24 09/01/2013   TSH 1.65 06/29/2013   INR 1.0 01/26/2011    I personally provided Breo inhaler use teaching. After the teaching patient was able to demonstrate it's use effectively. All questions were answered    Assessment & Plan:

## 2013-09-07 NOTE — Assessment & Plan Note (Signed)
See meds: Ceftin, Prom-cod syr; Virgel Bouquet

## 2013-09-07 NOTE — Telephone Encounter (Signed)
Ok 3:15 today Thx

## 2013-09-08 ENCOUNTER — Ambulatory Visit: Payer: Medicare Other | Admitting: Internal Medicine

## 2013-09-14 NOTE — ED Provider Notes (Signed)
Medical screening examination/treatment/procedure(s) were conducted as a shared visit with non-physician practitioner(s) and myself.  I personally evaluated the patient during the encounter.  EKG Interpretation    Date/Time:  Tuesday September 01 2013 12:50:05 EST Ventricular Rate:  69 PR Interval:    QRS Duration: 85 QT Interval:  433 QTC Calculation: 464 R Axis:   -30 Text Interpretation:  Atrial fibrillation Left axis deviation Nonspecific ST abnormality Confirmed by Anjeli Casad  MD, Harkirat Orozco ([id number]) on 09/01/2013 1:07:43 PM            Pt c/o lightheadedness earlier. No loc. No palp or cp. No sob. Labs. Fluids.   Mirna Mires, MD 09/14/13 [phone]

## 2013-09-23 ENCOUNTER — Ambulatory Visit (INDEPENDENT_AMBULATORY_CARE_PROVIDER_SITE_OTHER): Payer: Medicare Other | Admitting: Gynecology

## 2013-09-23 ENCOUNTER — Encounter: Payer: Self-pay | Admitting: Gynecology

## 2013-09-23 DIAGNOSIS — R300 Dysuria: Secondary | ICD-10-CM

## 2013-09-23 DIAGNOSIS — B373 Candidiasis of vulva and vagina: Secondary | ICD-10-CM

## 2013-09-23 DIAGNOSIS — B3731 Acute candidiasis of vulva and vagina: Secondary | ICD-10-CM

## 2013-09-23 LAB — URINALYSIS W MICROSCOPIC + REFLEX CULTURE
Bilirubin Urine: NEGATIVE
Casts: NONE SEEN
Crystals: NONE SEEN
Glucose, UA: NEGATIVE mg/dL
Leukocytes, UA: NEGATIVE
Nitrite: NEGATIVE
Protein, ur: NEGATIVE mg/dL
Specific Gravity, Urine: 1.02 (ref 1.005–1.030)
Urobilinogen, UA: 0.2 mg/dL (ref 0.0–1.0)
WBC, UA: NONE SEEN WBC/hpf (ref <3)
pH: 5.5 (ref 5.0–8.0)

## 2013-09-23 LAB — WET PREP FOR TRICH, YEAST, CLUE: Trich, Wet Prep: NONE SEEN

## 2013-09-23 MED ORDER — FLUCONAZOLE 150 MG PO TABS: 150.0000 mg | ORAL_TABLET | Freq: Once | ORAL | Status: DC

## 2013-09-23 NOTE — Patient Instructions (Signed)
Take Diflucan pill once. Take the second pill as needed if symptoms persist or recur.

## 2013-09-23 NOTE — Addendum Note (Signed)
Addended by: Nelva Nay on: 09/23/2013 04:46 PM   Modules accepted: Orders

## 2013-09-23 NOTE — Progress Notes (Signed)
Patient presents with several day history of intermittent vaginal irritation and dysuria. Recently treated with oral antibiotics for bronchitis. No vaginal odor or significant discharge. No frequency, urgency, suprapubic discomfort or low back pain. No fever chills nausea vomiting diarrhea constipation.  Exam with Maudie Mercury assistant Spine straight without CVA tenderness.  abdomen soft nontender without masses guarding rebound organomegaly. Pelvic external BUS vagina with atrophic changes. Wet prep done. Scant white discharge noted.  Assessment and plan: Urinalysis contaminated with many epithelial cells. We'll check culture. Wet prep with yeast. Suspect secondary to recent antibiotic treatment causing mild yeast vulvovaginitis leading to her symptoms. Will treat with Diflucan 150 mg x1 dose #2 provided for repeat dosing as needed. Followup if symptoms persist, worsen or recur.

## 2013-09-24 LAB — URINE CULTURE
Colony Count: NO GROWTH
Organism ID, Bacteria: NO GROWTH

## 2013-10-01 ENCOUNTER — Telehealth: Payer: Self-pay

## 2013-10-01 NOTE — Telephone Encounter (Signed)
Patient called regarding urine results. Informed ur cult negative.   She said Dr. Loetta Rough told her she could take a 2nd Diflucan if she felt like she needed it. She is not sure if she needs it. She is still feeling itching vaginally every now and then and a bit burning feeling sometimes.  I told her to go ahead and use another one this week and hopefully it will resolve her symptoms.

## 2013-10-15 ENCOUNTER — Telehealth: Payer: Self-pay | Admitting: *Deleted

## 2013-10-15 NOTE — Telephone Encounter (Signed)
PA filled out and faxed back to [facility] for climara patch 0.0375 mg patch, will wait for response.

## 2013-10-16 NOTE — Telephone Encounter (Signed)
The below has been approved through 09/09/14 under medicare part D.

## 2013-11-02 ENCOUNTER — Other Ambulatory Visit: Payer: Self-pay | Admitting: *Deleted

## 2013-11-02 MED ORDER — DULOXETINE HCL 60 MG PO CPEP: 60.0000 mg | ORAL_CAPSULE | Freq: Every day | ORAL | Status: DC

## 2013-11-09 ENCOUNTER — Other Ambulatory Visit: Payer: Self-pay | Admitting: Interventional Cardiology

## 2013-11-09 ENCOUNTER — Other Ambulatory Visit: Payer: Self-pay

## 2013-11-09 MED ORDER — APIXABAN 5 MG PO TABS: 5.0000 mg | ORAL_TABLET | Freq: Two times a day (BID) | ORAL | Status: DC

## 2013-11-09 MED ORDER — METOPROLOL SUCCINATE ER 25 MG PO TB24: 37.5000 mg | ORAL_TABLET | Freq: Two times a day (BID) | ORAL | Status: DC

## 2013-11-27 ENCOUNTER — Encounter (HOSPITAL_COMMUNITY): Payer: Self-pay | Admitting: Emergency Medicine

## 2013-11-27 ENCOUNTER — Emergency Department (HOSPITAL_COMMUNITY)
Admission: EM | Admit: 2013-11-27 | Discharge: 2013-11-27 | Disposition: A | Discharge status: Home / Self Care | Payer: Medicare Other | Attending: Emergency Medicine | Admitting: Emergency Medicine

## 2013-11-27 ENCOUNTER — Emergency Department (HOSPITAL_COMMUNITY): Payer: Medicare Other

## 2013-11-27 DIAGNOSIS — Z79899 Other long term (current) drug therapy: Secondary | ICD-10-CM | POA: Insufficient documentation

## 2013-11-27 DIAGNOSIS — R040 Epistaxis: Secondary | ICD-10-CM | POA: Insufficient documentation

## 2013-11-27 DIAGNOSIS — F411 Generalized anxiety disorder: Secondary | ICD-10-CM | POA: Insufficient documentation

## 2013-11-27 DIAGNOSIS — IMO0002 Reserved for concepts with insufficient information to code with codable children: Secondary | ICD-10-CM | POA: Insufficient documentation

## 2013-11-27 DIAGNOSIS — Z7901 Long term (current) use of anticoagulants: Secondary | ICD-10-CM | POA: Insufficient documentation

## 2013-11-27 DIAGNOSIS — Z860100 Personal history of colon polyps, unspecified: Secondary | ICD-10-CM | POA: Insufficient documentation

## 2013-11-27 DIAGNOSIS — Z87891 Personal history of nicotine dependence: Secondary | ICD-10-CM | POA: Insufficient documentation

## 2013-11-27 DIAGNOSIS — Z872 Personal history of diseases of the skin and subcutaneous tissue: Secondary | ICD-10-CM | POA: Insufficient documentation

## 2013-11-27 DIAGNOSIS — W1809XA Striking against other object with subsequent fall, initial encounter: Secondary | ICD-10-CM | POA: Insufficient documentation

## 2013-11-27 DIAGNOSIS — S99929A Unspecified injury of unspecified foot, initial encounter: Secondary | ICD-10-CM | POA: Insufficient documentation

## 2013-11-27 DIAGNOSIS — S99919A Unspecified injury of unspecified ankle, initial encounter: Secondary | ICD-10-CM

## 2013-11-27 DIAGNOSIS — I10 Essential (primary) hypertension: Secondary | ICD-10-CM | POA: Insufficient documentation

## 2013-11-27 DIAGNOSIS — S0003XA Contusion of scalp, initial encounter: Secondary | ICD-10-CM | POA: Insufficient documentation

## 2013-11-27 DIAGNOSIS — Z8709 Personal history of other diseases of the respiratory system: Secondary | ICD-10-CM | POA: Insufficient documentation

## 2013-11-27 DIAGNOSIS — S022XXA Fracture of nasal bones, initial encounter for closed fracture: Secondary | ICD-10-CM | POA: Insufficient documentation

## 2013-11-27 DIAGNOSIS — S1093XA Contusion of unspecified part of neck, initial encounter: Secondary | ICD-10-CM | POA: Insufficient documentation

## 2013-11-27 DIAGNOSIS — Z8739 Personal history of other diseases of the musculoskeletal system and connective tissue: Secondary | ICD-10-CM | POA: Insufficient documentation

## 2013-11-27 DIAGNOSIS — Y939 Activity, unspecified: Secondary | ICD-10-CM | POA: Insufficient documentation

## 2013-11-27 DIAGNOSIS — S8990XA Unspecified injury of unspecified lower leg, initial encounter: Secondary | ICD-10-CM | POA: Insufficient documentation

## 2013-11-27 DIAGNOSIS — K219 Gastro-esophageal reflux disease without esophagitis: Secondary | ICD-10-CM | POA: Insufficient documentation

## 2013-11-27 DIAGNOSIS — Y9229 Other specified public building as the place of occurrence of the external cause: Secondary | ICD-10-CM | POA: Insufficient documentation

## 2013-11-27 DIAGNOSIS — I4891 Unspecified atrial fibrillation: Secondary | ICD-10-CM | POA: Insufficient documentation

## 2013-11-27 DIAGNOSIS — S0083XA Contusion of other part of head, initial encounter: Secondary | ICD-10-CM

## 2013-11-27 LAB — CBC WITH DIFFERENTIAL/PLATELET
Basophils Absolute: 0 10*3/uL (ref 0.0–0.1)
Basophils Relative: 0 % (ref 0–1)
Eosinophils Absolute: 0.3 10*3/uL (ref 0.0–0.7)
Eosinophils Relative: 5 % (ref 0–5)
HCT: 35.5 % — ABNORMAL LOW (ref 36.0–46.0)
Hemoglobin: 12.2 g/dL (ref 12.0–15.0)
Lymphocytes Relative: 22 % (ref 12–46)
Lymphs Abs: 1.5 10*3/uL (ref 0.7–4.0)
MCH: 31.4 pg (ref 26.0–34.0)
MCHC: 34.4 g/dL (ref 30.0–36.0)
MCV: 91.3 fL (ref 78.0–100.0)
Monocytes Absolute: 0.6 10*3/uL (ref 0.1–1.0)
Monocytes Relative: 9 % (ref 3–12)
Neutro Abs: 4.3 10*3/uL (ref 1.7–7.7)
Neutrophils Relative %: 64 % (ref 43–77)
Platelets: 223 10*3/uL (ref 150–400)
RBC: 3.89 MIL/uL (ref 3.87–5.11)
RDW: 14.3 % (ref 11.5–15.5)
WBC: 6.8 10*3/uL (ref 4.0–10.5)

## 2013-11-27 LAB — PROTIME-INR
INR: 1.14 (ref 0.00–1.49)
Prothrombin Time: 14.4 seconds (ref 11.6–15.2)

## 2013-11-27 LAB — COMPREHENSIVE METABOLIC PANEL
ALT: 12 U/L (ref 0–35)
AST: 26 U/L (ref 0–37)
Albumin: 3.5 g/dL (ref 3.5–5.2)
Alkaline Phosphatase: 55 U/L (ref 39–117)
BUN: 20 mg/dL (ref 6–23)
CO2: 25 mEq/L (ref 19–32)
Calcium: 9 mg/dL (ref 8.4–10.5)
Chloride: 99 mEq/L (ref 96–112)
Creatinine, Ser: 1.26 mg/dL — ABNORMAL HIGH (ref 0.50–1.10)
GFR calc Af Amer: 46 mL/min — ABNORMAL LOW (ref >90)
GFR calc non Af Amer: 40 mL/min — ABNORMAL LOW (ref >90)
Glucose, Bld: 109 mg/dL — ABNORMAL HIGH (ref 70–99)
Potassium: 5 mEq/L (ref 3.7–5.3)
Sodium: 136 mEq/L — ABNORMAL LOW (ref 137–147)
Total Bilirubin: 0.5 mg/dL (ref 0.3–1.2)
Total Protein: 6.5 g/dL (ref 6.0–8.3)

## 2013-11-27 MED ORDER — ACETAMINOPHEN 325 MG PO TABS
650.0000 mg | ORAL_TABLET | Freq: Once | ORAL | Status: AC
  Administered 2013-11-27: 650 mg via ORAL
  Filled 2013-11-27: qty 2

## 2013-11-27 MED ORDER — TRAMADOL HCL 50 MG PO TABS
50.0000 mg | ORAL_TABLET | Freq: Once | ORAL | Status: AC
  Administered 2013-11-27: 50 mg via ORAL
  Filled 2013-11-27: qty 1

## 2013-11-27 MED ORDER — TRAMADOL HCL 50 MG PO TABS: 50.0000 mg | ORAL_TABLET | Freq: Four times a day (QID) | ORAL | Status: DC | PRN

## 2013-11-27 NOTE — ED Notes (Signed)
Patient transported to X-ray 

## 2013-11-27 NOTE — ED Notes (Signed)
Bed: WA25 Expected date:  Expected time:  Means of arrival:  Comments: EMS fall 

## 2013-11-27 NOTE — ED Notes (Signed)
C-collar in place, pt a/o x 4.

## 2013-11-27 NOTE — Discharge Instructions (Signed)
Take tylenol for pain. Take tramadol for severe pain. Do NOT drive with it.   Apply ice to the swollen area.   Follow up with a maxillofacial surgeon for nasal fracture.   Return to ER if you have nose bleed, severe pain, headache, vomiting.

## 2013-11-27 NOTE — ED Provider Notes (Signed)
CSN: [id number]     Arrival date & time 11/27/13  1334 History   First MD Initiated Contact with Patient 11/27/13 1350     Chief Complaint  Patient presents with  . Fall  . head injury, on blood thinners      (Consider location/radiation/quality/duration/timing/severity/associated sxs/prior Treatment) The history is provided by the patient.  Hailey [name] is a 79 y.o. female hx of GERD, SVT, afib on eliquis here with fall. 2 a restaurant and had a mechanical fall and hit her nose and forehead. She had a nosebleed had stopped. Also has an abrasion on nose but tetanus was up-to-date. Has a mild headache as well. Also landed on the right knee so had some right knee pain as well. Denies any syncope.    Past Medical History  Diagnosis Date  . History of colon polyps   . GERD (gastroesophageal reflux disease)   . HTN (hypertension)   . SVT (supraventricular tachycardia)     brief history  . PAC (premature atrial contraction)     Symptomatiic  . Anxiety   . Allergic rhinitis   . IBS (irritable bowel syndrome)     constipation predominant - Dr Earlean Shawl  . Diverticulosis of colon   . Hip bursitis 2010    Dr Para March, Post op seroma  . Osteopenia 12/2011    T score -1.3 FRAX 10%/1.9%  . Atrial fibrillation   . Lichen sclerosus    Past Surgical History  Procedure Laterality Date  . Bladder surgery    . Breast reduction surgery    . Appendectomy    . Troch bursa resection  2010  . Vaginal hysterectomy      BSO  . Oophorectomy      BSO  . Toe surgery     Family History  Problem Relation Age of Onset  . Colon cancer Mother   . Cancer Father     Prostate  . Diabetes Father   . Cancer Brother     Prostate   History  Substance Use Topics  . Smoking status: Former Smoker    Quit date: 09/11/1975  . Smokeless tobacco: Never Used  . Alcohol Use: 3.0 oz/week    6 drink(s) per week   OB History   Grav Para Term Preterm Abortions TAB SAB Ect Mult Living   2 2 2       2       Review of Systems  HENT: Positive for nosebleeds.   Musculoskeletal:       R knee pain   Neurological: Positive for headaches.  All other systems reviewed and are negative.      Allergies  Meloxicam and Sulfamethoxazole-trimethoprim  Home Medications   Current Outpatient Rx  Name  Route  Sig  Dispense  Refill  . apixaban (ELIQUIS) 5 MG TABS tablet   Oral   Take 1 tablet (5 mg total) by mouth 2 (two) times daily.   180 tablet   1   . cetirizine (ZYRTEC) 10 MG tablet   Oral   Take 10 mg by mouth daily.         . clobetasol cream (TEMOVATE) 0.05 %   Topical   Apply 1 application topically. Apply nightly as directed for infection.         . DULoxetine (CYMBALTA) 60 MG capsule   Oral   Take 1 capsule (60 mg total) by mouth daily.   90 capsule   1   . esomeprazole (NEXIUM) 40 MG  capsule   Oral   Take 40 mg by mouth daily before breakfast.           . estradiol (CLIMARA - DOSED IN MG/24 HR) 0.0375 mg/24hr patch   Transdermal   Place 1 patch (0.0375 mg total) onto the skin once a week.   4 patch   2   . LORazepam (ATIVAN) 1 MG tablet   Oral   Take 1 mg by mouth 2 (two) times daily as needed for anxiety or sleep.         . metoprolol succinate (TOPROL-XL) 25 MG 24 hr tablet   Oral   Take 1.5 tablets (37.5 mg total) by mouth 2 (two) times daily.   270 tablet   1   . Multiple Vitamins-Minerals (MULTIVITAMIN,TX-MINERALS) tablet   Oral   Take 1 tablet by mouth daily.           . Probiotic Product (SUPER PROBIOTIC PO)   Oral   Take 1 tablet by mouth daily.           BP 141/98  Pulse 61  Temp(Src) 97.9 F (36.6 C) (Oral)  Resp 16  SpO2 96% Physical Exam  Nursing note and vitals reviewed. Constitutional: She is oriented to person, place, and time.  Uncomfortable   HENT:  Head: Normocephalic.  Mouth/Throat: Oropharynx is clear and moist.  + frontal hematoma. Dry blood in nose, no active bleeding. Abrasion on bridge of nose. No septal  hematoma   Eyes: Conjunctivae are normal. Pupils are equal, round, and reactive to light.  Neck:  c collar in place, ? Midline tenderness   Cardiovascular: Normal rate, regular rhythm and normal heart sounds.   Pulmonary/Chest: Effort normal and breath sounds normal. No respiratory distress. She has no wheezes. She has no rales.  Abdominal: Soft. Bowel sounds are normal. She exhibits no distension. There is no tenderness. There is no rebound.  Musculoskeletal: Normal range of motion.  Nl hip ROM. Pelvis stable. R knee slightly swollen, mildly tender.   Neurological: She is alert and oriented to person, place, and time. No cranial nerve deficit. Coordination normal.  Skin: Skin is warm and dry.  Psychiatric: She has a normal mood and affect. Her behavior is normal. Judgment and thought content normal.    ED Course  Procedures (including critical care time) Labs Review Labs Reviewed  CBC WITH DIFFERENTIAL - Abnormal; Notable for the following:    HCT 35.5 (*)    All other components within normal limits  COMPREHENSIVE METABOLIC PANEL - Abnormal; Notable for the following:    Sodium 136 (*)    Glucose, Bld 109 (*)    Creatinine, Ser 1.26 (*)    GFR calc non Af Amer 40 (*)    GFR calc Af Amer 46 (*)    All other components within normal limits  PROTIME-INR   Imaging Review Ct Head Wo Contrast  11/27/2013   CLINICAL DATA:  Fall, nasal swelling.  EXAM: CT HEAD WITHOUT CONTRAST  CT MAXILLOFACIAL WITHOUT CONTRAST  CT CERVICAL SPINE WITHOUT CONTRAST  TECHNIQUE: Multidetector CT imaging of the head, cervical spine, and maxillofacial structures were performed using the standard protocol without intravenous contrast. Multiplanar CT image reconstructions of the cervical spine and maxillofacial structures were also generated.  COMPARISON:  CT C SPINE W/CM dated 07/17/2006; MR C SPINE W/O CM dated 07/09/2006  FINDINGS: CT HEAD FINDINGS  Soft tissue swelling anteriorly within the forehead soft  tissues. Mild atrophy and chronic microvascular changes. No acute  intracranial abnormality. Specifically, no hemorrhage, hydrocephalus, mass lesion, acute infarction, or significant intracranial injury. No acute calvarial abnormality. Visualized paranasal sinuses and mastoids clear. Orbital soft tissues unremarkable.  CT MAXILLOFACIAL FINDINGS  There are nasal bone fractures in the midline and to the left of midline. Slight displacement of left nasal bones. Nasal septum is intact. No additional facial fracture. Paranasal sinuses are clear. Zygomatic arches are intact. Mandible is unremarkable. Mastoid air cells are clear.  CT CERVICAL SPINE FINDINGS  Normal alignment. Degenerative disc disease from C4-5 thru C6-7. Prevertebral soft tissues are normal. No fracture. No epidural or paraspinal hematoma.  IMPRESSION: No acute intracranial abnormality.  Nasal bone fractures with slight displacement.  No acute bony abnormality in the cervical spine.   Electronically Signed   By: Rolm Baptise M.D.   On: 11/27/2013 15:06   Ct Cervical Spine Wo Contrast  11/27/2013   CLINICAL DATA:  Fall, nasal swelling.  EXAM: CT HEAD WITHOUT CONTRAST  CT MAXILLOFACIAL WITHOUT CONTRAST  CT CERVICAL SPINE WITHOUT CONTRAST  TECHNIQUE: Multidetector CT imaging of the head, cervical spine, and maxillofacial structures were performed using the standard protocol without intravenous contrast. Multiplanar CT image reconstructions of the cervical spine and maxillofacial structures were also generated.  COMPARISON:  CT C SPINE W/CM dated 07/17/2006; MR C SPINE W/O CM dated 07/09/2006  FINDINGS: CT HEAD FINDINGS  Soft tissue swelling anteriorly within the forehead soft tissues. Mild atrophy and chronic microvascular changes. No acute intracranial abnormality. Specifically, no hemorrhage, hydrocephalus, mass lesion, acute infarction, or significant intracranial injury. No acute calvarial abnormality. Visualized paranasal sinuses and mastoids clear.  Orbital soft tissues unremarkable.  CT MAXILLOFACIAL FINDINGS  There are nasal bone fractures in the midline and to the left of midline. Slight displacement of left nasal bones. Nasal septum is intact. No additional facial fracture. Paranasal sinuses are clear. Zygomatic arches are intact. Mandible is unremarkable. Mastoid air cells are clear.  CT CERVICAL SPINE FINDINGS  Normal alignment. Degenerative disc disease from C4-5 thru C6-7. Prevertebral soft tissues are normal. No fracture. No epidural or paraspinal hematoma.  IMPRESSION: No acute intracranial abnormality.  Nasal bone fractures with slight displacement.  No acute bony abnormality in the cervical spine.   Electronically Signed   By: Rolm Baptise M.D.   On: 11/27/2013 15:06   Dg Knee Complete 4 Views Right  11/27/2013   CLINICAL DATA:  Fall.  Knee injury and pain.  EXAM: RIGHT KNEE - COMPLETE 4+ VIEW  COMPARISON:  None.  FINDINGS: There is no evidence of fracture, dislocation, or joint effusion. There is no evidence of arthropathy or other focal bone abnormality. Peripheral vascular calcification noted.  IMPRESSION: No radiographic abnormality of the knee joint. Peripheral vascular calcification noted.   Electronically Signed   By: Earle Gell M.D.   On: 11/27/2013 15:03   Ct Maxillofacial Wo Cm  11/27/2013   CLINICAL DATA:  Fall, nasal swelling.  EXAM: CT HEAD WITHOUT CONTRAST  CT MAXILLOFACIAL WITHOUT CONTRAST  CT CERVICAL SPINE WITHOUT CONTRAST  TECHNIQUE: Multidetector CT imaging of the head, cervical spine, and maxillofacial structures were performed using the standard protocol without intravenous contrast. Multiplanar CT image reconstructions of the cervical spine and maxillofacial structures were also generated.  COMPARISON:  CT C SPINE W/CM dated 07/17/2006; MR C SPINE W/O CM dated 07/09/2006  FINDINGS: CT HEAD FINDINGS  Soft tissue swelling anteriorly within the forehead soft tissues. Mild atrophy and chronic microvascular changes. No acute  intracranial abnormality. Specifically, no hemorrhage, hydrocephalus, mass lesion,  acute infarction, or significant intracranial injury. No acute calvarial abnormality. Visualized paranasal sinuses and mastoids clear. Orbital soft tissues unremarkable.  CT MAXILLOFACIAL FINDINGS  There are nasal bone fractures in the midline and to the left of midline. Slight displacement of left nasal bones. Nasal septum is intact. No additional facial fracture. Paranasal sinuses are clear. Zygomatic arches are intact. Mandible is unremarkable. Mastoid air cells are clear.  CT CERVICAL SPINE FINDINGS  Normal alignment. Degenerative disc disease from C4-5 thru C6-7. Prevertebral soft tissues are normal. No fracture. No epidural or paraspinal hematoma.  IMPRESSION: No acute intracranial abnormality.  Nasal bone fractures with slight displacement.  No acute bony abnormality in the cervical spine.   Electronically Signed   By: Rolm Baptise M.D.   On: 11/27/2013 15:06     EKG Interpretation None      MDM   Final diagnoses:  None   Hailey [name] is a 79 y.o. female here with sp fall on eliquis. CT showed nasal bone fracture. No intracranial bleed. Knee xray unremarkable. Will d/c home with pain meds and maxillofacial f/u.    Wandra Arthurs, MD 11/27/13 1538

## 2013-11-27 NOTE — ED Notes (Addendum)
Pt hx of afib and on blood thinners. Per ems pt [place] [employer], fell forward, hit her head, large hematoma to forehead. Pt denies LOC, abrasions to nose, right knee, and right hand. Bleeding controlled. Pt alert and oriented x4, answering questions appropriately. Denies blurry vision or dizziness.  Pain to forehead, lip, right knee, and left arm.pt able to move all extremities

## 2013-12-22 ENCOUNTER — Other Ambulatory Visit (INDEPENDENT_AMBULATORY_CARE_PROVIDER_SITE_OTHER): Payer: Medicare Other

## 2013-12-22 ENCOUNTER — Ambulatory Visit (INDEPENDENT_AMBULATORY_CARE_PROVIDER_SITE_OTHER): Payer: Medicare Other | Admitting: Internal Medicine

## 2013-12-22 ENCOUNTER — Encounter: Payer: Self-pay | Admitting: Internal Medicine

## 2013-12-22 VITALS — BP 120/88 | HR 72 | Temp 96.6°F | Resp 16 | Wt 173.0 lb

## 2013-12-22 DIAGNOSIS — R197 Diarrhea, unspecified: Secondary | ICD-10-CM

## 2013-12-22 DIAGNOSIS — R110 Nausea: Secondary | ICD-10-CM

## 2013-12-22 DIAGNOSIS — T148XXA Other injury of unspecified body region, initial encounter: Secondary | ICD-10-CM

## 2013-12-22 DIAGNOSIS — I10 Essential (primary) hypertension: Secondary | ICD-10-CM

## 2013-12-22 LAB — HEPATIC FUNCTION PANEL
ALT: 14 U/L (ref 0–35)
AST: 27 U/L (ref 0–37)
Albumin: 3.5 g/dL (ref 3.5–5.2)
Alkaline Phosphatase: 51 U/L (ref 39–117)
Bilirubin, Direct: 0 mg/dL (ref 0.0–0.3)
Total Bilirubin: 0.5 mg/dL (ref 0.3–1.2)
Total Protein: 6.8 g/dL (ref 6.0–8.3)

## 2013-12-22 LAB — BASIC METABOLIC PANEL
BUN: 23 mg/dL (ref 6–23)
CO2: 24 mEq/L (ref 19–32)
Calcium: 8.9 mg/dL (ref 8.4–10.5)
Chloride: 106 mEq/L (ref 96–112)
Creatinine, Ser: 1.6 mg/dL — ABNORMAL HIGH (ref 0.4–1.2)
GFR: 34.2 mL/min — ABNORMAL LOW (ref >60.00)
Glucose, Bld: 75 mg/dL (ref 70–99)
Potassium: 4.5 mEq/L (ref 3.5–5.1)
Sodium: 137 mEq/L (ref 135–145)

## 2013-12-22 LAB — CBC WITH DIFFERENTIAL/PLATELET
Basophils Absolute: 0 10*3/uL (ref 0.0–0.1)
Basophils Relative: 0.4 % (ref 0.0–3.0)
Eosinophils Absolute: 0.2 10*3/uL (ref 0.0–0.7)
Eosinophils Relative: 3.9 % (ref 0.0–5.0)
HCT: 38.8 % (ref 36.0–46.0)
Hemoglobin: 13.1 g/dL (ref 12.0–15.0)
Lymphocytes Relative: 23.9 % (ref 12.0–46.0)
Lymphs Abs: 1.3 10*3/uL (ref 0.7–4.0)
MCHC: 33.8 g/dL (ref 30.0–36.0)
MCV: 93.6 fl (ref 78.0–100.0)
Monocytes Absolute: 0.8 10*3/uL (ref 0.1–1.0)
Monocytes Relative: 15.4 % — ABNORMAL HIGH (ref 3.0–12.0)
Neutro Abs: 3.1 10*3/uL (ref 1.4–7.7)
Neutrophils Relative %: 56.4 % (ref 43.0–77.0)
Platelets: 258 10*3/uL (ref 150.0–400.0)
RBC: 4.15 Mil/uL (ref 3.87–5.11)
RDW: 14 % (ref 11.5–14.6)
WBC: 5.5 10*3/uL (ref 4.5–10.5)

## 2013-12-22 LAB — LIPASE: Lipase: 59 U/L (ref 11.0–59.0)

## 2013-12-22 MED ORDER — ONDANSETRON 4 MG PO TBDP: 4.0000 mg | ORAL_TABLET | Freq: Three times a day (TID) | ORAL | Status: DC | PRN

## 2013-12-22 MED ORDER — DIPHENOXYLATE-ATROPINE 2.5-0.025 MG PO TABS: 1.0000 | ORAL_TABLET | Freq: Four times a day (QID) | ORAL | Status: DC | PRN

## 2013-12-22 NOTE — Assessment & Plan Note (Signed)
Continue with current prescription therapy as reflected on the Med list.  

## 2013-12-22 NOTE — Progress Notes (Signed)
   Subjective:   HPI F/u ER visit for a fall (tripped) and nose fx 2 wks ago - no LOC  C/o nausea and diarrhea x 1-2 wks. On a probiotic   The patient needs to address  chronic hypertension that has been well controlled with medicine  F/u bruising - better F/u elev. BP  F/u R hip pain.  Review of Systems  Constitutional: Negative for appetite change and fatigue.  HENT: Negative for tinnitus.   Eyes: Negative for pain.  Respiratory: Negative for chest tightness.   Musculoskeletal: Negative for back pain and neck pain.  Neurological: Negative for dizziness, speech difficulty and weakness.  Hematological: Negative for adenopathy. Bruises/bleeds easily.  Psychiatric/Behavioral: Negative for behavioral problems and dysphoric mood.   Wt Readings from Last 3 Encounters:  12/22/13 173 lb (78.472 kg)  06/29/13 175 lb (79.379 kg)  03/02/13 178 lb (80.74 kg)       BP Readings from Last 3 Encounters:  12/22/13 120/88  11/27/13 141/98  09/07/13 120/80    Objective:   Physical Exam  Constitutional: She appears well-developed and well-nourished. No distress.  HENT:  Head: Normocephalic.  Right Ear: External ear normal.  Left Ear: External ear normal.  Nose: Nose normal.  Mouth/Throat: Oropharynx is clear and moist.  Eyes: Conjunctivae are normal. Pupils are equal, round, and reactive to light. Right eye exhibits no discharge. Left eye exhibits no discharge.  Neck: Normal range of motion. Neck supple. No JVD present. No tracheal deviation present. No thyromegaly present.  Cardiovascular: Normal rate and regular rhythm.   Murmur (1-2/6) heard. Pulmonary/Chest: No stridor. No respiratory distress. She has no wheezes.  Abdominal: Soft. Bowel sounds are normal. She exhibits no distension and no mass. There is no tenderness. There is no rebound and no guarding.  Musculoskeletal: She exhibits no edema and no tenderness.  Lymphadenopathy:    She has no cervical adenopathy.   Neurological: She displays normal reflexes. No cranial nerve deficit. She exhibits normal muscle tone. Coordination normal.  Skin: No rash noted. No erythema.  Tanned.   Psychiatric: She has a normal mood and affect. Her behavior is normal. Judgment and thought content normal.       Lab Results  Component Value Date   WBC 6.8 11/27/2013   HGB 12.2 11/27/2013   HCT 35.5* 11/27/2013   PLT 223 11/27/2013   GLUCOSE 109* 11/27/2013   CHOL 208* 06/29/2013   TRIG 138.0 06/29/2013   HDL 97.00 06/29/2013   LDLDIRECT 89.1 06/29/2013   LDLCALC 60 10/31/2011   ALT 12 11/27/2013   AST 26 11/27/2013   NA 136* 11/27/2013   K 5.0 11/27/2013   CL 99 11/27/2013   CREATININE 1.26* 11/27/2013   BUN 20 11/27/2013   CO2 25 11/27/2013   TSH 1.65 06/29/2013   INR 1.14 11/27/2013       Assessment & Plan:

## 2013-12-22 NOTE — Progress Notes (Signed)
Pre visit review using our clinic review tool, if applicable. No additional management support is needed unless otherwise documented below in the visit note. 

## 2013-12-22 NOTE — Assessment & Plan Note (Signed)
[facility]ot[facility] Welchol Labs

## 2013-12-23 LAB — GIARDIA/CRYPTOSPORIDIUM (EIA)
Cryptosporidium Screen (EIA): NEGATIVE
Giardia Screen (EIA): NEGATIVE

## 2013-12-23 LAB — CLOSTRIDIUM DIFFICILE EIA: CDIFTX: NEGATIVE

## 2013-12-27 NOTE — Assessment & Plan Note (Signed)
4/15 s/p fall - face

## 2013-12-27 NOTE — Assessment & Plan Note (Signed)
Continue with current prescription therapy as reflected on the Med list.  

## 2013-12-29 ENCOUNTER — Encounter: Payer: Medicare Other | Admitting: Internal Medicine

## 2013-12-30 ENCOUNTER — Ambulatory Visit (INDEPENDENT_AMBULATORY_CARE_PROVIDER_SITE_OTHER): Payer: Medicare Other | Admitting: Interventional Cardiology

## 2013-12-30 ENCOUNTER — Encounter: Payer: Self-pay | Admitting: Interventional Cardiology

## 2013-12-30 VITALS — BP 130/76 | HR 68 | Ht 64.0 in | Wt 177.0 lb

## 2013-12-30 DIAGNOSIS — Z7901 Long term (current) use of anticoagulants: Secondary | ICD-10-CM

## 2013-12-30 DIAGNOSIS — I4891 Unspecified atrial fibrillation: Secondary | ICD-10-CM

## 2013-12-30 DIAGNOSIS — I10 Essential (primary) hypertension: Secondary | ICD-10-CM

## 2013-12-30 DIAGNOSIS — I480 Paroxysmal atrial fibrillation: Secondary | ICD-10-CM

## 2013-12-30 NOTE — Patient Instructions (Signed)
Your physician recommends that you continue on your current medications as directed. Please refer to the Current Medication list given to you today.  Your physician recommends that you return for lab work in: 6-7 months ([place])  Your physician wants you to follow-up in: 1 year You will receive a reminder letter in the mail two months in advance. If you don't receive a letter, please call our office to schedule the follow-up appointment.

## 2013-12-30 NOTE — Progress Notes (Signed)
Patient ID: Hailey [name], female   DOB: [birth year], 79 y.o.   MRN: [id number]    [address] [place], [place]  [address] Phone: [phone] Fax:  [phone]  Date:  12/30/2013   ID:  JAYLON GRODE, DOB 1934-12-07, MRN [id number]  PCP:  Walker Kehr, MD   ASSESSMENT:  1. Paroxysmal atrial fibrillation, asymptomatic 2. Chronic anticoagulation with Eliquis. 3. Hypertension  PLAN:  1. Continue chronic anticoagulation therapy 2. Cautioned to call if bleeding in stool/black stools/head trauma 3. Hemoglobin and creatinine in October 4. Clinical followup in one year   SUBJECTIVE: Hailey [name] is a 79 y.o. female who has asymptomatic paroxysmal atrial fibrillation and is therefore on chronic oral anticoagulation therapy. She has had 2 episodes of trauma related bleeding, once in the left hip she developed a large hematoma after fall and another episode where she fell and fractured her nose and had a for head and periorbital hematoma. She is not having syncope. She does have episodes of low blood pressure which may be vasovagal. These episodes are rare. She denies headache. She has not had chest pain orthopnea. She exercises regularly.   Wt Readings from Last 3 Encounters:  12/30/13 177 lb (80.287 kg)  12/22/13 173 lb (78.472 kg)  06/29/13 175 lb (79.379 kg)     Past Medical History  Diagnosis Date  . History of colon polyps   . GERD (gastroesophageal reflux disease)   . HTN (hypertension)   . SVT (supraventricular tachycardia)     brief history  . PAC (premature atrial contraction)     Symptomatiic  . Anxiety   . Allergic rhinitis   . IBS (irritable bowel syndrome)     constipation predominant - Dr Earlean Shawl  . Diverticulosis of colon   . Hip bursitis 2010    Dr Para March, Post op seroma  . Osteopenia 12/2011    T score -1.3 FRAX 10%/1.9%  . Atrial fibrillation   . Lichen sclerosus     Current Outpatient Prescriptions  Medication Sig Dispense Refill    . apixaban (ELIQUIS) 5 MG TABS tablet Take 1 tablet (5 mg total) by mouth 2 (two) times daily.  180 tablet  1  . cetirizine (ZYRTEC) 10 MG tablet Take 10 mg by mouth daily.      . clobetasol cream (TEMOVATE) 5.05 % Apply 1 application topically. Apply nightly as directed for infection.      . diphenoxylate-atropine (LOMOTIL) 2.5-0.025 MG per tablet Take 1 tablet by mouth 4 (four) times daily as needed for diarrhea or loose stools.  30 tablet  0  . DULoxetine (CYMBALTA) 60 MG capsule Take 1 capsule (60 mg total) by mouth daily.  90 capsule  1  . esomeprazole (NEXIUM) 40 MG capsule Take 40 mg by mouth daily before breakfast.        . estradiol (CLIMARA - DOSED IN MG/24 HR) 0.0375 mg/24hr patch Place 1 patch (0.0375 mg total) onto the skin once a week.  4 patch  2  . LORazepam (ATIVAN) 1 MG tablet Take 1 mg by mouth 2 (two) times daily as needed for anxiety or sleep.      . metoprolol succinate (TOPROL-XL) 25 MG 24 hr tablet Take 1.5 tablets (37.5 mg total) by mouth 2 (two) times daily.  270 tablet  1  . ondansetron (ZOFRAN ODT) 4 MG disintegrating tablet Take 1 tablet (4 mg total) by mouth every 8 (eight) hours as needed for nausea or vomiting.  20 tablet  0  . traMADol (ULTRAM) 50 MG tablet Take 1 tablet (50 mg total) by mouth every 6 (six) hours as needed.  15 tablet  0   No current facility-administered medications for this visit.    Allergies:    Allergies  Allergen Reactions  . Meloxicam [Meloxicam] Other (See Comments)    Jittery and headache  . Sulfamethoxazole-Trimethoprim Nausea Only    Social History:  The patient  reports that she quit smoking about 38 years ago. She has never used smokeless tobacco. She reports that she drinks about 3 ounces of alcohol per week. She reports that she does not use illicit drugs.   ROS:  Please see the history of present illness.  Denies weight loss, syncope, transient neurological symptoms, melena, hematuria  All other systems reviewed and  negative.   OBJECTIVE: VS:  BP 130/76  Pulse 68  Ht 5\' 4"  (1.626 m)  Wt 177 lb (80.287 kg)  BMI 30.37 kg/m2 Well nourished, well developed, in no acute distress, elderly but younger than stated age  28: normal. Resolving for head hematoma  Neck: JVD flat. Carotid bruit absent  Cardiac:  normal S1, S2; RRR; no murmur Lungs:  clear to auscultation bilaterally, no wheezing, rhonchi or rales Abd: soft, nontender, no hepatomegaly Ext: Edema absent. Pulses 2+ and symmetric  Skin: warm and dry Neuro:  CNs 2-12 intact, no focal abnormalities noted  EKG:  Not repeated. Last EKG done in December 2014 revealed atrial fibrillation       Signed, Illene Labrador III, MD 12/30/2013 3:32 PM

## 2014-01-03 ENCOUNTER — Other Ambulatory Visit: Payer: Self-pay | Admitting: Interventional Cardiology

## 2014-01-25 ENCOUNTER — Encounter: Payer: Self-pay | Admitting: Internal Medicine

## 2014-01-25 ENCOUNTER — Other Ambulatory Visit (INDEPENDENT_AMBULATORY_CARE_PROVIDER_SITE_OTHER): Payer: Medicare Other

## 2014-01-25 ENCOUNTER — Ambulatory Visit (INDEPENDENT_AMBULATORY_CARE_PROVIDER_SITE_OTHER): Payer: Medicare Other | Admitting: Internal Medicine

## 2014-01-25 VITALS — BP 126/86 | HR 61 | Temp 96.7°F | Ht 64.0 in

## 2014-01-25 DIAGNOSIS — Z23 Encounter for immunization: Secondary | ICD-10-CM

## 2014-01-25 DIAGNOSIS — F411 Generalized anxiety disorder: Secondary | ICD-10-CM

## 2014-01-25 DIAGNOSIS — Z0000 Encounter for general adult medical examination without abnormal findings: Secondary | ICD-10-CM

## 2014-01-25 DIAGNOSIS — N259 Disorder resulting from impaired renal tubular function, unspecified: Secondary | ICD-10-CM

## 2014-01-25 DIAGNOSIS — R42 Dizziness and giddiness: Secondary | ICD-10-CM

## 2014-01-25 DIAGNOSIS — R55 Syncope and collapse: Secondary | ICD-10-CM

## 2014-01-25 LAB — URINALYSIS
Bilirubin Urine: NEGATIVE
Hgb urine dipstick: NEGATIVE
Leukocytes, UA: NEGATIVE
Nitrite: NEGATIVE
Specific Gravity, Urine: 1.025 (ref 1.000–1.030)
Total Protein, Urine: NEGATIVE
Urine Glucose: NEGATIVE
Urobilinogen, UA: 0.2 (ref 0.0–1.0)
pH: 6 (ref 5.0–8.0)

## 2014-01-25 LAB — TSH: TSH: 1.37 u[IU]/mL (ref 0.35–4.50)

## 2014-01-25 NOTE — Assessment & Plan Note (Signed)
Continue with current prescription therapy as reflected on the Med list.  

## 2014-01-25 NOTE — Assessment & Plan Note (Signed)

## 2014-01-25 NOTE — Assessment & Plan Note (Signed)
Labs

## 2014-01-25 NOTE — Progress Notes (Signed)
Pre visit review using our clinic review tool, if applicable. No additional management support is needed unless otherwise documented below in the visit note. 

## 2014-01-25 NOTE — Progress Notes (Signed)
   Subjective:   HPI  The patient is here for a wellness exam. The patient has been doing well overall without major physical or psychological issues going on lately. Facial contusion is recovering  The patient needs to address  chronic hypertension that has been well controlled with medicine  F/u bruising - better F/u elev. BP  F/u R hip pain.  Review of Systems  Constitutional: Negative for appetite change and fatigue.  HENT: Negative for tinnitus.   Eyes: Negative for pain.  Respiratory: Negative for chest tightness and shortness of breath.   Cardiovascular: Negative for chest pain.  Musculoskeletal: Negative for back pain and neck pain.  Neurological: Negative for dizziness, speech difficulty and weakness.  Hematological: Negative for adenopathy. Bruises/bleeds easily.  Psychiatric/Behavioral: Negative for behavioral problems and dysphoric mood.   Wt Readings from Last 3 Encounters:  12/30/13 177 lb (80.287 kg)  12/22/13 173 lb (78.472 kg)  06/29/13 175 lb (79.379 kg)       BP Readings from Last 3 Encounters:  01/25/14 126/86  12/30/13 130/76  12/22/13 120/88    Objective:   Physical Exam  Constitutional: She appears well-developed and well-nourished. No distress.  HENT:  Head: Normocephalic.  Right Ear: External ear normal.  Left Ear: External ear normal.  Nose: Nose normal.  Mouth/Throat: Oropharynx is clear and moist.  Eyes: Conjunctivae are normal. Pupils are equal, round, and reactive to light. Right eye exhibits no discharge. Left eye exhibits no discharge.  Neck: Normal range of motion. Neck supple. No JVD present. No tracheal deviation present. No thyromegaly present.  Cardiovascular: Normal rate and regular rhythm.   Murmur (1-2/6) heard. Pulmonary/Chest: No stridor. No respiratory distress. She has no wheezes.  Abdominal: Soft. Bowel sounds are normal. She exhibits no distension and no mass. There is no tenderness. There is no rebound and no guarding.   Musculoskeletal: She exhibits no edema and no tenderness.  Lymphadenopathy:    She has no cervical adenopathy.  Neurological: She displays normal reflexes. No cranial nerve deficit. She exhibits normal muscle tone. Coordination normal.  Skin: No rash noted. No erythema.  Tanned.   Psychiatric: She has a normal mood and affect. Her behavior is normal. Judgment and thought content normal.  H-P (-) R shin abrasion     Lab Results  Component Value Date   WBC 5.5 12/22/2013   HGB 13.1 12/22/2013   HCT 38.8 12/22/2013   PLT 258.0 12/22/2013   GLUCOSE 75 12/22/2013   CHOL 208* 06/29/2013   TRIG 138.0 06/29/2013   HDL 97.00 06/29/2013   LDLDIRECT 89.1 06/29/2013   LDLCALC 60 10/31/2011   ALT 14 12/22/2013   AST 27 12/22/2013   NA 137 12/22/2013   K 4.5 12/22/2013   CL 106 12/22/2013   CREATININE 1.6* 12/22/2013   BUN 23 12/22/2013   CO2 24 12/22/2013   TSH 1.65 06/29/2013   INR 1.14 11/27/2013       Assessment & Plan:

## 2014-01-25 NOTE — Assessment & Plan Note (Signed)
Doing ok now 

## 2014-01-25 NOTE — Patient Instructions (Signed)
Plan Z - Zola's diet 

## 2014-02-23 ENCOUNTER — Telehealth: Payer: Self-pay | Admitting: *Deleted

## 2014-02-23 NOTE — Telephone Encounter (Signed)
Rf req for Lorazepam 1 mg 1 po bid prn. # 180. Ok to Rf in PCP's absence?

## 2014-02-24 MED ORDER — LORAZEPAM 1 MG PO TABS: 1.0000 mg | ORAL_TABLET | Freq: Two times a day (BID) | ORAL | Status: DC | PRN

## 2014-02-24 NOTE — Telephone Encounter (Signed)
Ok - note need for screening with [facility] prior to picking up or sending refill (if not already done) Please print and I will sign

## 2014-02-24 NOTE — Telephone Encounter (Signed)
Rx printed/signed/ upfront for p/u with contract. Left detailed mess informing pt.

## 2014-04-02 ENCOUNTER — Telehealth: Payer: Self-pay | Admitting: Internal Medicine

## 2014-04-02 MED ORDER — CIPROFLOXACIN HCL 500 MG PO TABS: 500.0000 mg | ORAL_TABLET | Freq: Two times a day (BID) | ORAL | Status: DC

## 2014-04-02 NOTE — Telephone Encounter (Signed)
Caller: Jennalynn/Patient; Patient Name: Sylvester Harder; PCP: Plotnikov, Alex (Adults only); Best Callback Phone Number: [phone] Calling about having vomiting x1 and nausea and cramping onset 03/29/14. Diarrhea and cramping has continued on and off throughout the week but was better eating bland foods. Yesterday she tried to get back to regular diet and had a [place] sandwich for lunch and shrimp with cream pasta for dinner Last night-04/01/14. Overnight she had diarrhea 6-7 x  and took diphenexolate 3 tabs and Ondansterone ODT 4 mgs at 0500 and finally got some rest. Stomach feels slightly bloated and queezy stool very light brown in color.  She last voided at 1100-04/02/14 after she woke. Afebrile. She had crab cake on Sunday and thinks it may have caused sx originally, but husband had same thing and was fine. Blood mixed with stool on 03/29/14 but none since. Hx Hemhorroids.  Diarrhea episode in Febuary 2015 and she is trying same treatment recommended from Dr. Christen Bame. Triage and Care advice per Diarrhea or Change in Bowel Control Protocol and advised to stick with home care and MD recommendations and to be checked in office within 2 weeks for "Change in characer of bowel movement". She is currently away at [facility] and is willing to drive home to be checked if needed. Advised to call back if sx worsen again or with anymore blood in stool- despite treatment with meds and diet.

## 2014-04-02 NOTE — Telephone Encounter (Signed)
Try Cipro 500 mg po bid x 5 days if not better. Pls call in #10 Thx

## 2014-04-02 NOTE — Telephone Encounter (Signed)
Notified pt with md response. Sent med to requested pharmacy '[facility]" in [place]...Johny Chess

## 2014-04-28 ENCOUNTER — Other Ambulatory Visit: Payer: Self-pay | Admitting: Interventional Cardiology

## 2014-04-28 ENCOUNTER — Encounter: Payer: Self-pay | Admitting: Internal Medicine

## 2014-05-18 ENCOUNTER — Other Ambulatory Visit: Payer: Self-pay | Admitting: Interventional Cardiology

## 2014-05-20 ENCOUNTER — Other Ambulatory Visit: Payer: Self-pay | Admitting: Internal Medicine

## 2014-05-20 ENCOUNTER — Other Ambulatory Visit: Payer: Self-pay | Admitting: *Deleted

## 2014-05-20 MED ORDER — DULOXETINE HCL 60 MG PO CPEP: 60.0000 mg | ORAL_CAPSULE | Freq: Every day | ORAL | Status: DC

## 2014-06-02 ENCOUNTER — Other Ambulatory Visit: Payer: Self-pay | Admitting: Gynecology

## 2014-06-22 ENCOUNTER — Other Ambulatory Visit: Payer: Self-pay | Admitting: Internal Medicine

## 2014-06-25 NOTE — Telephone Encounter (Signed)
Called [facility] had to leave msg on pharmacy vm left md authorization...Hailey [name]

## 2014-06-28 ENCOUNTER — Other Ambulatory Visit (INDEPENDENT_AMBULATORY_CARE_PROVIDER_SITE_OTHER): Payer: Medicare Other | Admitting: *Deleted

## 2014-06-28 DIAGNOSIS — Z7901 Long term (current) use of anticoagulants: Secondary | ICD-10-CM

## 2014-06-28 LAB — HEMOGLOBIN: Hemoglobin: 12.1 g/dL (ref 12.0–15.0)

## 2014-06-28 LAB — CREATININE, SERUM: Creatinine, Ser: 1.2 mg/dL (ref 0.4–1.2)

## 2014-07-01 ENCOUNTER — Telehealth: Payer: Self-pay

## 2014-07-01 NOTE — Telephone Encounter (Signed)
Message copied by Lamar Laundry on Thu Jul 01, 2014 10:03 AM ------      Message from: Daneen Schick      Created: Wed Jun 30, 2014  5:04 PM       Normal hgb and kidney function ------

## 2014-07-01 NOTE — Telephone Encounter (Signed)
pt aware of lab results.Normal hgb and kidney function.pt verbalized understanding.

## 2014-07-03 ENCOUNTER — Encounter (HOSPITAL_COMMUNITY): Payer: Self-pay | Admitting: Emergency Medicine

## 2014-07-03 ENCOUNTER — Observation Stay (HOSPITAL_COMMUNITY)
Admission: EM | Admit: 2014-07-03 | Discharge: 2014-07-04 | Disposition: A | Discharge status: Home / Self Care | Payer: Medicare Other | Attending: Cardiology | Admitting: Cardiology

## 2014-07-03 ENCOUNTER — Emergency Department (HOSPITAL_COMMUNITY): Payer: Medicare Other

## 2014-07-03 DIAGNOSIS — I480 Paroxysmal atrial fibrillation: Secondary | ICD-10-CM

## 2014-07-03 DIAGNOSIS — I4891 Unspecified atrial fibrillation: Secondary | ICD-10-CM | POA: Insufficient documentation

## 2014-07-03 DIAGNOSIS — IMO0001 Reserved for inherently not codable concepts without codable children: Secondary | ICD-10-CM

## 2014-07-03 DIAGNOSIS — Z87891 Personal history of nicotine dependence: Secondary | ICD-10-CM | POA: Insufficient documentation

## 2014-07-03 DIAGNOSIS — I471 Supraventricular tachycardia: Secondary | ICD-10-CM | POA: Diagnosis not present

## 2014-07-03 DIAGNOSIS — F419 Anxiety disorder, unspecified: Secondary | ICD-10-CM | POA: Diagnosis not present

## 2014-07-03 DIAGNOSIS — I10 Essential (primary) hypertension: Secondary | ICD-10-CM

## 2014-07-03 DIAGNOSIS — K219 Gastro-esophageal reflux disease without esophagitis: Secondary | ICD-10-CM | POA: Diagnosis not present

## 2014-07-03 DIAGNOSIS — K589 Irritable bowel syndrome without diarrhea: Secondary | ICD-10-CM | POA: Insufficient documentation

## 2014-07-03 DIAGNOSIS — M8580 Other specified disorders of bone density and structure, unspecified site: Secondary | ICD-10-CM | POA: Diagnosis not present

## 2014-07-03 DIAGNOSIS — R079 Chest pain, unspecified: Principal | ICD-10-CM

## 2014-07-03 DIAGNOSIS — R0789 Other chest pain: Secondary | ICD-10-CM

## 2014-07-03 DIAGNOSIS — K5730 Diverticulosis of large intestine without perforation or abscess without bleeding: Secondary | ICD-10-CM | POA: Insufficient documentation

## 2014-07-03 DIAGNOSIS — Z872 Personal history of diseases of the skin and subcutaneous tissue: Secondary | ICD-10-CM | POA: Insufficient documentation

## 2014-07-03 DIAGNOSIS — I491 Atrial premature depolarization: Secondary | ICD-10-CM | POA: Insufficient documentation

## 2014-07-03 DIAGNOSIS — Z86010 Personal history of colonic polyps: Secondary | ICD-10-CM | POA: Diagnosis not present

## 2014-07-03 DIAGNOSIS — Z79899 Other long term (current) drug therapy: Secondary | ICD-10-CM | POA: Diagnosis not present

## 2014-07-03 LAB — CBC WITH DIFFERENTIAL/PLATELET
Basophils Absolute: 0 10*3/uL (ref 0.0–0.1)
Basophils Relative: 0 % (ref 0–1)
Eosinophils Absolute: 0.2 10*3/uL (ref 0.0–0.7)
Eosinophils Relative: 3 % (ref 0–5)
HCT: 33 % — ABNORMAL LOW (ref 36.0–46.0)
Hemoglobin: 11 g/dL — ABNORMAL LOW (ref 12.0–15.0)
Lymphocytes Relative: 11 % — ABNORMAL LOW (ref 12–46)
Lymphs Abs: 0.9 10*3/uL (ref 0.7–4.0)
MCH: 29.6 pg (ref 26.0–34.0)
MCHC: 33.3 g/dL (ref 30.0–36.0)
MCV: 88.7 fL (ref 78.0–100.0)
Monocytes Absolute: 0.9 10*3/uL (ref 0.1–1.0)
Monocytes Relative: 12 % (ref 3–12)
Neutro Abs: 5.9 10*3/uL (ref 1.7–7.7)
Neutrophils Relative %: 74 % (ref 43–77)
Platelets: 287 10*3/uL (ref 150–400)
RBC: 3.72 MIL/uL — ABNORMAL LOW (ref 3.87–5.11)
RDW: 14.2 % (ref 11.5–15.5)
WBC: 7.9 10*3/uL (ref 4.0–10.5)

## 2014-07-03 LAB — BASIC METABOLIC PANEL
Anion gap: 11 (ref 5–15)
BUN: 24 mg/dL — ABNORMAL HIGH (ref 6–23)
CO2: 22 mEq/L (ref 19–32)
Calcium: 8.7 mg/dL (ref 8.4–10.5)
Chloride: 104 mEq/L (ref 96–112)
Creatinine, Ser: 1.19 mg/dL — ABNORMAL HIGH (ref 0.50–1.10)
GFR calc Af Amer: 50 mL/min — ABNORMAL LOW (ref >90)
GFR calc non Af Amer: 43 mL/min — ABNORMAL LOW (ref >90)
Glucose, Bld: 126 mg/dL — ABNORMAL HIGH (ref 70–99)
Potassium: 4.5 mEq/L (ref 3.7–5.3)
Sodium: 137 mEq/L (ref 137–147)

## 2014-07-03 LAB — I-STAT TROPONIN, ED: Troponin i, poc: 0 ng/mL (ref 0.00–0.08)

## 2014-07-03 LAB — TROPONIN I: Troponin I: <0.3 ng/mL (ref <0.30)

## 2014-07-03 MED ORDER — ACTIVE PARTNERSHIP FOR HEALTH OF YOUR HEART BOOK
Freq: Once | Status: AC
  Administered 2014-07-03: 19:00:00
  Filled 2014-07-03: qty 1

## 2014-07-03 MED ORDER — ASPIRIN 81 MG PO CHEW
324.0000 mg | CHEWABLE_TABLET | Freq: Once | ORAL | Status: AC
  Administered 2014-07-03: 324 mg via ORAL
  Filled 2014-07-03: qty 4

## 2014-07-03 MED ORDER — METOPROLOL SUCCINATE ER 50 MG PO TB24
75.0000 mg | ORAL_TABLET | Freq: Every day | ORAL | Status: DC
  Administered 2014-07-03: 75 mg via ORAL
  Filled 2014-07-03 (×2): qty 1

## 2014-07-03 MED ORDER — ESTRADIOL 0.0375 MG/24HR TD PTWK: 0.0375 mg | MEDICATED_PATCH | TRANSDERMAL | Status: DC

## 2014-07-03 MED ORDER — ONDANSETRON 4 MG PO TBDP
4.0000 mg | ORAL_TABLET | Freq: Three times a day (TID) | ORAL | Status: DC | PRN
  Filled 2014-07-03: qty 1

## 2014-07-03 MED ORDER — ONDANSETRON HCL 4 MG/2ML IJ SOLN: 4.0000 mg | Freq: Four times a day (QID) | INTRAMUSCULAR | Status: DC | PRN

## 2014-07-03 MED ORDER — LORATADINE 10 MG PO TABS
10.0000 mg | ORAL_TABLET | Freq: Every day | ORAL | Status: DC
  Administered 2014-07-04: 10 mg via ORAL
  Filled 2014-07-03: qty 1

## 2014-07-03 MED ORDER — HEPARIN SODIUM (PORCINE) 5000 UNIT/ML IJ SOLN
5000.0000 [IU] | Freq: Three times a day (TID) | INTRAMUSCULAR | Status: DC
  Administered 2014-07-03 – 2014-07-04 (×2): 5000 [IU] via SUBCUTANEOUS
  Filled 2014-07-03 (×3): qty 1

## 2014-07-03 MED ORDER — IOHEXOL 350 MG/ML SOLN
80.0000 mL | Freq: Once | INTRAVENOUS | Status: AC | PRN
  Administered 2014-07-03: 80 mL via INTRAVENOUS

## 2014-07-03 MED ORDER — LORAZEPAM 1 MG PO TABS
1.0000 mg | ORAL_TABLET | Freq: Two times a day (BID) | ORAL | Status: DC | PRN
  Administered 2014-07-03: 1 mg via ORAL
  Filled 2014-07-03: qty 1

## 2014-07-03 MED ORDER — PANTOPRAZOLE SODIUM 40 MG PO TBEC
40.0000 mg | DELAYED_RELEASE_TABLET | Freq: Every day | ORAL | Status: DC
  Administered 2014-07-04: 40 mg via ORAL
  Filled 2014-07-03 (×2): qty 1

## 2014-07-03 MED ORDER — EXERCISE FOR HEART AND HEALTH BOOK
Freq: Once | Status: AC
  Administered 2014-07-04: 13:00:00
  Filled 2014-07-03 (×2): qty 1

## 2014-07-03 MED ORDER — SODIUM CHLORIDE 0.9 % IV BOLUS (SEPSIS)
500.0000 mL | Freq: Once | INTRAVENOUS | Status: AC
  Administered 2014-07-03: 500 mL via INTRAVENOUS

## 2014-07-03 MED ORDER — DULOXETINE HCL 60 MG PO CPEP
60.0000 mg | ORAL_CAPSULE | Freq: Every day | ORAL | Status: DC
  Administered 2014-07-04: 60 mg via ORAL
  Filled 2014-07-03: qty 1

## 2014-07-03 MED ORDER — HYDROCODONE-ACETAMINOPHEN 5-325 MG PO TABS: 1.0000 | ORAL_TABLET | ORAL | Status: DC | PRN

## 2014-07-03 MED ORDER — ACETAMINOPHEN 325 MG PO TABS: 650.0000 mg | ORAL_TABLET | ORAL | Status: DC | PRN

## 2014-07-03 MED ORDER — METOPROLOL SUCCINATE ER 25 MG PO TB24
37.5000 mg | ORAL_TABLET | Freq: Two times a day (BID) | ORAL | Status: DC
  Administered 2014-07-03 – 2014-07-04 (×2): 37.5 mg via ORAL
  Filled 2014-07-03 (×2): qty 2

## 2014-07-03 MED ORDER — ASPIRIN EC 81 MG PO TBEC
81.0000 mg | DELAYED_RELEASE_TABLET | Freq: Every day | ORAL | Status: DC
  Administered 2014-07-03 – 2014-07-04 (×2): 81 mg via ORAL
  Filled 2014-07-03: qty 1

## 2014-07-03 NOTE — H&P (Signed)
Primary cardiologist: Dr Daneen Schick Admitting Cardiologist: Dr Carlyle Dolly MD  Clinical Summary Ms. Juul is a 79 y.o.female hx of parox afib on eliquis, HTN with occasional episodic hypotension, admitted with chest pain.  States symptoms started Wednesday night. She had completed a workout with her physical trainer (upper body weights, elliptical cardio) approx 6 hours prior without any troubles. States she typically works out 3 times a week but this was first time in few weeks because she had been out of town. All night Wed aching neck pain radiating to left shoulder, worst with movement and position. Continued all day Thursday. Friday developed aching midchest pain, worst with deep breaths and moving. No other associated symptoms. Chest pain lasted all day Friday, causing her to come int today for evaluation. States symptoms were somewhat improved with taking aleve at home.   Cr 1.19, GFR 1.19, K 4.5, Hgb 11, Plt 287, trop neg EKG afib, likely old anteroseptal infarct CXR small opacity posterior lung base CTA without acute aortic pathology, small left pleural effusion, patchy ground glass lower lobes, LM and 3 vessel CAD  Echo 11/2012 LVEF 60-65%, no WMAs, indeterminant diastolic function    Allergies  Allergen Reactions  . Meloxicam [Meloxicam] Other (See Comments)    Jittery and headache  . Sulfamethoxazole-Trimethoprim Nausea Only    Medications Scheduled Medications:    Infusions:    PRN Medications:        Past Medical History  Diagnosis Date  . History of colon polyps   . GERD (gastroesophageal reflux disease)   . HTN (hypertension)   . SVT (supraventricular tachycardia)     brief history  . PAC (premature atrial contraction)     Symptomatiic  . Anxiety   . Allergic rhinitis   . IBS (irritable bowel syndrome)     constipation predominant - Dr Earlean Shawl  . Diverticulosis of colon   . Hip bursitis 2010    Dr Para March, Post op seroma  . Osteopenia  12/2011    T score -1.3 FRAX 10%/1.9%  . Atrial fibrillation   . Lichen sclerosus     Past Surgical History  Procedure Laterality Date  . Bladder surgery    . Breast reduction surgery    . Appendectomy    . Troch bursa resection  2010  . Vaginal hysterectomy      BSO  . Oophorectomy      BSO  . Toe surgery      Family History  Problem Relation Age of Onset  . Colon cancer Mother   . Cancer Father     Prostate  . Diabetes Father   . Cancer Brother     Prostate    Social History Ms. Badour reports that she quit smoking about 38 years ago. She has never used smokeless tobacco. Ms. Pridmore reports that she drinks about 3 ounces of alcohol per week.  Review of Systems CONSTITUTIONAL: No weight loss, fever, chills, weakness or fatigue.  HEENT: Eyes: No visual loss, blurred vision, double vision or yellow sclerae. No hearing loss, sneezing, congestion, runny nose or sore throat.  SKIN: No rash or itching.  CARDIOVASCULAR: per HPI RESPIRATORY: No shortness of breath, cough or sputum.  GASTROINTESTINAL: No anorexia, nausea, vomiting or diarrhea. No abdominal pain or blood.  GENITOURINARY: no polyuria, no dysuria NEUROLOGICAL: No headache, dizziness, syncope, paralysis, ataxia, numbness or tingling in the extremities. No change in bowel or bladder control.  MUSCULOSKELETAL: per HPI  HEMATOLOGIC: No anemia, bleeding or bruising.  LYMPHATICS: No enlarged nodes. No history of splenectomy.  PSYCHIATRIC: No history of depression or anxiety.      Physical Examination Blood pressure 88/65, pulse 80, temperature 97.8 F (36.6 C), temperature source Oral, resp. rate 19, SpO2 99.00%. No intake or output data in the 24 hours ending 07/03/14 1524  HEENT: sclera clear  Cardiovascular: RRR, no m/r/g, no JVD, no carotid bruits  Respiratory: CTAB  GI: abdomen soft, NT, ND  MSK: chest wall tender to palpation  Neuro: no focal deficits  Psych: appropriate affect   Lab  Results  Basic Metabolic Panel:  Recent Labs Lab 06/28/14 0903 07/03/14 1020  NA  --  137  K  --  4.5  CL  --  104  CO2  --  22  GLUCOSE  --  126*  BUN  --  24*  CREATININE 1.2 1.19*  CALCIUM  --  8.7    Liver Function Tests: No results found for this basename: AST, ALT, ALKPHOS, BILITOT, PROT, ALBUMIN,  in the last 168 hours  CBC:  Recent Labs Lab 06/28/14 0903 07/03/14 1020  WBC  --  7.9  NEUTROABS  --  5.9  HGB 12.1 11.0*  HCT  --  33.0*  MCV  --  88.7  PLT  --  287    Cardiac Enzymes: No results found for this basename: CKTOTAL, CKMB, CKMBINDEX, TROPONINI,  in the last 168 hours  BNP: No components found with this basename: POCBNP,     Impression/Recommendations 1. Chest pain/ Neck and shoulder pain - noncardiac pain in description, EKG without ischemic changes and initial troponin negative - CT scan does show LM and 3 vessel CAD, the functionality of these lesions is unclear. This presentation of pain not consistent with angina. She remains fairly active, working out 3 times a week without significant symptoms - will bring in for obs overnight, cycle EKGs and cardiac enzymes. Obtain Lexiscan in AM to evaluate the functionality of detected coronary lesions. Obtain echo to further risk stratify. Avoid holding her Toprol and exercising her in setting of known afib.  - hold eliquis tonight in case invasive testing required. Start ASA - check lipid panel, HgbA1c.   2. Chronic hypotension - long term issue according to notes, continue low dose Toprol only  3. Afib - rates controlled, on eliquis. Hold tonight in case of invasive testing   Carlyle Dolly, M.D.

## 2014-07-03 NOTE — ED Notes (Signed)
Pt reports pain began in left side of neck and back, occurred after doing a workout with personal trainer. Now has a discomfort in mid chest, occurs more when lying down and when taking a deep breath. ekg done at triage, no acute distress noted.

## 2014-07-03 NOTE — ED Notes (Signed)
Patient transported to CT 

## 2014-07-03 NOTE — ED Provider Notes (Signed)
CSN: [id number]     Arrival date & time 07/03/14  8366 History   First MD Initiated Contact with Patient 07/03/14 1001     Chief Complaint  Patient presents with  . Chest Pain     (Consider location/radiation/quality/duration/timing/severity/associated sxs/prior Treatment) The history is provided by the patient and medical records.   This is a 79 y.o. F with PMH significant for HTN, PAC's, AFIB on Eliquis, presenting to the ED for chest pain.  Patient states symptoms began Wednesday after doing a workout with her personal trainer.  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  States after returning home she developed some posterior neck and left shoulder pain.  States she felt she had strained a muscle so she went to bed and felt better in the morning.  States symptoms returned last night, more intense, and now localized more into her chest.  States worse with lying flat than moving around.  She denies any shortness of breath, palpitations, dizziness, weakness, diaphoresis, nausea, vomiting.  Patient followed by cardiology, Dr. Daneen Schick.  Patient is not a smoker.  BP 91/63 on arrival, patient and husband states this is typical BP for her.   Past Medical History  Diagnosis Date  . History of colon polyps   . GERD (gastroesophageal reflux disease)   . HTN (hypertension)   . SVT (supraventricular tachycardia)     brief history  . PAC (premature atrial contraction)     Symptomatiic  . Anxiety   . Allergic rhinitis   . IBS (irritable bowel syndrome)     constipation predominant - Dr Earlean Shawl  . Diverticulosis of colon   . Hip bursitis 2010    Dr Para March, Post op seroma  . Osteopenia 12/2011    T score -1.3 FRAX 10%/1.9%  . Atrial fibrillation   . Lichen sclerosus    Past Surgical History  Procedure Laterality Date  . Bladder surgery    . Breast reduction surgery    . Appendectomy    . Troch bursa resection  2010  .  Vaginal hysterectomy      BSO  . Oophorectomy      BSO  . Toe surgery     Family History  Problem Relation Age of Onset  . Colon cancer Mother   . Cancer Father     Prostate  . Diabetes Father   . Cancer Brother     Prostate   History  Substance Use Topics  . Smoking status: Former Smoker    Quit date: 09/11/1975  . Smokeless tobacco: Never Used  . Alcohol Use: 3.0 oz/week    6 drink(s) per week   OB History   Grav Para Term Preterm Abortions TAB SAB Ect Mult Living   2 2 2       2      Review of Systems  Cardiovascular: Positive for chest pain.  All other systems reviewed and are negative.     Allergies  Meloxicam and Sulfamethoxazole-trimethoprim  Home Medications   Prior to Admission medications   Medication Sig Start Date End Date Taking? Authorizing Provider  cetirizine (ZYRTEC) 10 MG tablet Take 10 mg by mouth daily.    Historical Provider, MD  clobetasol cream (TEMOVATE) 0.05 % APPLY NIGHTLY AS DIRECTED. 06/02/14   Anastasio Auerbach, MD  diphenoxylate-atropine (LOMOTIL) 2.5-0.025 MG per tablet Take 1 tablet by mouth 4 (four) times daily as needed for diarrhea or  loose stools. 12/22/13   Aleksei Plotnikov V, MD  DULoxetine (CYMBALTA) 60 MG capsule Take 1 capsule (60 mg total) by mouth daily. 05/20/14   Aleksei Plotnikov V, MD  ELIQUIS 5 MG TABS tablet TAKE 1 TABLET TWICE DAILY. 05/19/14   Belva Crome III, MD  esomeprazole (NEXIUM) 40 MG capsule Take 40 mg by mouth daily before breakfast.      Historical Provider, MD  estradiol (CLIMARA - DOSED IN MG/24 HR) 0.0375 mg/24hr patch Place 1 patch (0.0375 mg total) onto the skin once a week. 06/01/13   Anastasio Auerbach, MD  LORazepam (ATIVAN) 1 MG tablet TAKE (1) TABLET TWICE A DAY AS NEEDED FOR ANXIETY OR SLEEP. 06/24/14   Aleksei Plotnikov V, MD  metoprolol succinate (TOPROL-XL) 25 MG 24 hr tablet TAKE 1 & 1/2 TABLETS TWICE DAILY. 04/29/14   Belva Crome III, MD  ondansetron (ZOFRAN ODT) 4 MG disintegrating tablet  Take 1 tablet (4 mg total) by mouth every 8 (eight) hours as needed for nausea or vomiting. 12/22/13   Cassandria Anger, MD   BP 91/63  Pulse 80  Temp(Src) 97.8 F (36.6 C) (Oral)  Resp 18  SpO2 99%  Physical Exam  Nursing note and vitals reviewed. Constitutional: She is oriented to person, place, and time. She appears well-developed and well-nourished. No distress.  Lying comfortably in bed, NAD  HENT:  Head: Normocephalic and atraumatic.  Mouth/Throat: Oropharynx is clear and moist.  Eyes: Conjunctivae and EOM are normal. Pupils are equal, round, and reactive to light.  Neck: Normal range of motion. Neck supple.  Cardiovascular: Normal rate, regular rhythm and normal heart sounds.   Pulmonary/Chest: Effort normal and breath sounds normal. No respiratory distress. She has no wheezes.  Reproducible tenderness of lower mid-sternal region and left scapula  Abdominal: Soft. Bowel sounds are normal. There is no tenderness. There is no guarding.  Musculoskeletal: Normal range of motion. She exhibits no edema.  Neurological: She is alert and oriented to person, place, and time.  Skin: Skin is warm and dry. She is not diaphoretic.  Psychiatric: She has a normal mood and affect.    ED Course  Procedures (including critical care time) Labs Review Labs Reviewed  CBC WITH DIFFERENTIAL - Abnormal; Notable for the following:    RBC 3.72 (*)    Hemoglobin 11.0 (*)    HCT 33.0 (*)    Lymphocytes Relative 11 (*)    All other components within normal limits  BASIC METABOLIC PANEL - Abnormal; Notable for the following:    Glucose, Bld 126 (*)    BUN 24 (*)    Creatinine, Ser 1.19 (*)    GFR calc non Af Amer 43 (*)    GFR calc Af Amer 50 (*)    All other components within normal limits  I-STAT TROPOININ, ED    Imaging Review Dg Chest 2 View  07/03/2014   CLINICAL DATA:  Mid sternal chest pain and left shoulder pain for 4 days. History of hypertension. Ex-smoker.  EXAM: CHEST  2 VIEW   COMPARISON:  09/01/2013  FINDINGS: There is opacity at the left lung base partly silhouetting the posterior lateral costophrenic sulci. This may be atelectasis. A small area of infiltrate is possible. There is milder linear and reticular opacity at the right lung base that is consistent with scarring and/ or atelectasis.  Remainder of the lungs is clear.  No pneumothorax.  Cardiac silhouette is borderline enlarged. Aorta is mildly uncoiled. No mediastinal or  hilar masses.  Bony thorax is demineralized but grossly intact.  IMPRESSION: 1. Small area of opacity in the left lower lobe at the posterior lung base. This may reflect small pneumonia. It is more likely atelectasis. 2. No other evidence of acute cardiopulmonary disease.   Electronically Signed   By: Lajean Manes M.D.   On: 07/03/2014 11:36   Ct Angio Chest Aortic Dissect W &/or W/o  07/03/2014   CLINICAL DATA:  79 year old female with acute onset of left-sided back and back pain after a workout with a personal trainer, now complaining of discomfort in the mid chest which is worse while lying down and with deep inspiration. Hypotension.  EXAM: CT ANGIOGRAPHY CHEST, ABDOMEN AND PELVIS  TECHNIQUE: Multidetector CT imaging through the chest, abdomen and pelvis was performed using the standard protocol during bolus administration of intravenous contrast. Multiplanar reconstructed images and MIPs were obtained and reviewed to evaluate the vascular anatomy.  CONTRAST:  89mL OMNIPAQUE IOHEXOL 350 MG/ML SOLN  COMPARISON:  Chest CT 02/03/2013.  FINDINGS: CTA CHEST FINDINGS  Mediastinum: Heart size is normal. There is no significant pericardial fluid, thickening or pericardial calcification. There is atherosclerosis of the thoracic aorta, the great vessels of the mediastinum and the coronary arteries, including calcified atherosclerotic plaque in the left main, left anterior descending, left circumflex and right coronary arteries. No evidence of aneurysm or  dissection in the thoracic aorta. Numerous prominent but non pathologically enlarged mediastinal lymph nodes are conspicuous by their number rather than their size (this is nonspecific). No definite pathologically enlarged mediastinal lymph nodes are noted. Esophagus is unremarkable in appearance.  Lungs/Pleura: Small left pleural effusion layering dependently with some associated passive atelectasis in the left lower lobe. There is some patchy ground-glass attenuation in the lung bases bilaterally, particularly in the lower lobes, where there is some associated thickening of the peribronchovascular interstitium. 5 mm nodule in the right middle lobe associated with the minor fissure (image 69 of series 6) is unchanged, presumably a subpleural lymph node. No other definite suspicious appearing pulmonary nodules or masses are noted.  Musculoskeletal: Old compression fractures of superior and inferior endplates of I78 appear unchanged. The appearance of T10 vertebral body suggest presence of a hemangioma. There are no aggressive appearing lytic or blastic lesions noted in the visualized portions of the skeleton.  CTA ABDOMEN and PELVIS FINDINGS  Hepatobiliary: No focal cystic or solid hepatic lesions. No intra or extrahepatic biliary ductal dilatation. Gallbladder is normal in appearance.  Pancreas: Fatty atrophy in the pancreas. No focal pancreatic lesions.  Spleen: Unremarkable.  Adrenals/Urinary Tract: Exophytic 2.2 cm low-attenuation in the upper pole of the left kidney, compatible with a simple cyst. There is also an a 9 mm low attenuation lesion in the anterior aspect of the interpolar region of the left kidney, compatible with a tiny simple cyst. Right kidney is normal in appearance. No hydroureteronephrosis or perinephric stranding to indicate urinary tract obstruction at this time.  Stomach/Bowel: Normal appearance of the stomach. No pathologic dilatation of the small bowel or colon. Extensive colonic  diverticulosis, particularly in the sigmoid colon, without surrounding inflammatory changes to suggest an acute diverticulitis at this time.  Vascular/Lymphatic: Mild atherosclerosis throughout the abdominal and pelvic vasculature, without evidence of aneurysm, dissection or major branch occlusion. Celiac axis, SMA and the IMA are all widely patent without hemodynamically significant stenosis. No pathologically enlarged lymph nodes are noted in the abdomen or pelvis.  Reproductive: Status post hysterectomy. Ovaries are not confidently identified may be  surgically absent or atrophic.  Other: Tiny umbilical hernia containing only a small amount of omental fat. No significant volume of ascites. No pneumoperitoneum.  Musculoskeletal: There are no aggressive appearing lytic or blastic lesions noted in the visualized portions of the skeleton. Multilevel degenerative disc disease throughout the lumbar spine.  Review of the MIP images confirms the above findings.  IMPRESSION: 1. No acute abnormality of the thoracoabdominal aorta. 2. Small left pleural effusion layering dependently with some associated passive atelectasis in the left lower lobe. 3. Patchy areas of ground-glass attenuation and thickening of the peribronchovascular interstitium throughout the lower lobes of the lungs bilaterally, suspicious for sequela of recent aspiration. 4. Mild cardiomegaly. 5. Atherosclerosis, including left main and 3 vessel coronary artery disease. Assessment for potential risk factor modification, dietary therapy or pharmacologic therapy may be warranted, if clinically indicated. 6. Colonic diverticulosis without findings to suggest acute diverticulitis at this time. 7. Normal appendix. 8. Tiny umbilical hernia containing only omental fat incidentally noted. 9. Additional incidental findings, as above.   Electronically Signed   By: Vinnie Langton M.D.   On: 07/03/2014 14:33   Ct Angio Abd/pel W/ And/or W/o  07/03/2014   CLINICAL  DATA:  79 year old female with acute onset of left-sided back and back pain after a workout with a personal trainer, now complaining of discomfort in the mid chest which is worse while lying down and with deep inspiration. Hypotension.  EXAM: CT ANGIOGRAPHY CHEST, ABDOMEN AND PELVIS  TECHNIQUE: Multidetector CT imaging through the chest, abdomen and pelvis was performed using the standard protocol during bolus administration of intravenous contrast. Multiplanar reconstructed images and MIPs were obtained and reviewed to evaluate the vascular anatomy.  CONTRAST:  57mL OMNIPAQUE IOHEXOL 350 MG/ML SOLN  COMPARISON:  Chest CT 02/03/2013.  FINDINGS: CTA CHEST FINDINGS  Mediastinum: Heart size is normal. There is no significant pericardial fluid, thickening or pericardial calcification. There is atherosclerosis of the thoracic aorta, the great vessels of the mediastinum and the coronary arteries, including calcified atherosclerotic plaque in the left main, left anterior descending, left circumflex and right coronary arteries. No evidence of aneurysm or dissection in the thoracic aorta. Numerous prominent but non pathologically enlarged mediastinal lymph nodes are conspicuous by their number rather than their size (this is nonspecific). No definite pathologically enlarged mediastinal lymph nodes are noted. Esophagus is unremarkable in appearance.  Lungs/Pleura: Small left pleural effusion layering dependently with some associated passive atelectasis in the left lower lobe. There is some patchy ground-glass attenuation in the lung bases bilaterally, particularly in the lower lobes, where there is some associated thickening of the peribronchovascular interstitium. 5 mm nodule in the right middle lobe associated with the minor fissure (image 69 of series 6) is unchanged, presumably a subpleural lymph node. No other definite suspicious appearing pulmonary nodules or masses are noted.  Musculoskeletal: Old compression fractures  of superior and inferior endplates of Z61 appear unchanged. The appearance of T10 vertebral body suggest presence of a hemangioma. There are no aggressive appearing lytic or blastic lesions noted in the visualized portions of the skeleton.  CTA ABDOMEN and PELVIS FINDINGS  Hepatobiliary: No focal cystic or solid hepatic lesions. No intra or extrahepatic biliary ductal dilatation. Gallbladder is normal in appearance.  Pancreas: Fatty atrophy in the pancreas. No focal pancreatic lesions.  Spleen: Unremarkable.  Adrenals/Urinary Tract: Exophytic 2.2 cm low-attenuation in the upper pole of the left kidney, compatible with a simple cyst. There is also an a 9 mm low attenuation lesion in  the anterior aspect of the interpolar region of the left kidney, compatible with a tiny simple cyst. Right kidney is normal in appearance. No hydroureteronephrosis or perinephric stranding to indicate urinary tract obstruction at this time.  Stomach/Bowel: Normal appearance of the stomach. No pathologic dilatation of the small bowel or colon. Extensive colonic diverticulosis, particularly in the sigmoid colon, without surrounding inflammatory changes to suggest an acute diverticulitis at this time.  Vascular/Lymphatic: Mild atherosclerosis throughout the abdominal and pelvic vasculature, without evidence of aneurysm, dissection or major branch occlusion. Celiac axis, SMA and the IMA are all widely patent without hemodynamically significant stenosis. No pathologically enlarged lymph nodes are noted in the abdomen or pelvis.  Reproductive: Status post hysterectomy. Ovaries are not confidently identified may be surgically absent or atrophic.  Other: Tiny umbilical hernia containing only a small amount of omental fat. No significant volume of ascites. No pneumoperitoneum.  Musculoskeletal: There are no aggressive appearing lytic or blastic lesions noted in the visualized portions of the skeleton. Multilevel degenerative disc disease  throughout the lumbar spine.  Review of the MIP images confirms the above findings.  IMPRESSION: 1. No acute abnormality of the thoracoabdominal aorta. 2. Small left pleural effusion layering dependently with some associated passive atelectasis in the left lower lobe. 3. Patchy areas of ground-glass attenuation and thickening of the peribronchovascular interstitium throughout the lower lobes of the lungs bilaterally, suspicious for sequela of recent aspiration. 4. Mild cardiomegaly. 5. Atherosclerosis, including left main and 3 vessel coronary artery disease. Assessment for potential risk factor modification, dietary therapy or pharmacologic therapy may be warranted, if clinically indicated. 6. Colonic diverticulosis without findings to suggest acute diverticulitis at this time. 7. Normal appendix. 8. Tiny umbilical hernia containing only omental fat incidentally noted. 9. Additional incidental findings, as above.   Electronically Signed   By: Vinnie Langton M.D.   On: 07/03/2014 14:33     EKG Interpretation   Date/Time:  Saturday July 03 2014 09:55:35 EDT Ventricular Rate:  92 PR Interval:    QRS Duration: 76 QT Interval:  382 QTC Calculation: 472 R Axis:   13 Text Interpretation:  Atrial fibrillation Low voltage QRS Septal infarct ,  age undetermined Abnormal ECG Confirmed by Alvino Chapel  MD, Ovid Curd [phone])  on 07/03/2014 10:08:44 AM      MDM   Final diagnoses:  Chest pain   79 year old female with chest pain intermittently since Wednesday while working out with her trainer.  On exam, patient notes acute distress. She does have some mild tenderness to her lower midsternal region on exam as well as her left scapula.  EKG A. fib, rate controlled (hx of same).  Trop negative.  CXR clear.  Lab work reassuring.  Patient's BP low on arrival, patient and husband state this is normal for her however on review of records from prior visits with PCP and cardiologist, usually in 397'Q-734'L systolic  range.  Patient is on Eliqius without known TAA, however given her low BP without known cause will obtain CTA chest and abdomen/pelvis to rule our dissection.  Scans negative for dissection, however new finding of 3 vessel and left main atherosclerosis.  No prior hx of CAD.  BP remains low despite fluid bolus.  Case discussed with cardiology who will admit for further management.  Larene Pickett, PA-C 07/03/14 [phone]

## 2014-07-04 ENCOUNTER — Observation Stay (HOSPITAL_COMMUNITY): Payer: Medicare Other

## 2014-07-04 DIAGNOSIS — I480 Paroxysmal atrial fibrillation: Secondary | ICD-10-CM

## 2014-07-04 DIAGNOSIS — I471 Supraventricular tachycardia: Secondary | ICD-10-CM | POA: Diagnosis not present

## 2014-07-04 DIAGNOSIS — Z136 Encounter for screening for cardiovascular disorders: Secondary | ICD-10-CM

## 2014-07-04 DIAGNOSIS — I10 Essential (primary) hypertension: Secondary | ICD-10-CM

## 2014-07-04 DIAGNOSIS — K219 Gastro-esophageal reflux disease without esophagitis: Secondary | ICD-10-CM | POA: Diagnosis not present

## 2014-07-04 DIAGNOSIS — R079 Chest pain, unspecified: Secondary | ICD-10-CM

## 2014-07-04 DIAGNOSIS — I059 Rheumatic mitral valve disease, unspecified: Secondary | ICD-10-CM

## 2014-07-04 LAB — COMPREHENSIVE METABOLIC PANEL
ALT: 8 U/L (ref 0–35)
AST: 14 U/L (ref 0–37)
Albumin: 2.7 g/dL — ABNORMAL LOW (ref 3.5–5.2)
Alkaline Phosphatase: 52 U/L (ref 39–117)
Anion gap: 14 (ref 5–15)
BUN: 19 mg/dL (ref 6–23)
CO2: 20 mEq/L (ref 19–32)
Calcium: 8.6 mg/dL (ref 8.4–10.5)
Chloride: 104 mEq/L (ref 96–112)
Creatinine, Ser: 1.13 mg/dL — ABNORMAL HIGH (ref 0.50–1.10)
GFR calc Af Amer: 53 mL/min — ABNORMAL LOW (ref >90)
GFR calc non Af Amer: 46 mL/min — ABNORMAL LOW (ref >90)
Glucose, Bld: 97 mg/dL (ref 70–99)
Potassium: 4.1 mEq/L (ref 3.7–5.3)
Sodium: 138 mEq/L (ref 137–147)
Total Bilirubin: 0.4 mg/dL (ref 0.3–1.2)
Total Protein: 5.8 g/dL — ABNORMAL LOW (ref 6.0–8.3)

## 2014-07-04 LAB — CBC
HCT: 30.9 % — ABNORMAL LOW (ref 36.0–46.0)
Hemoglobin: 10.6 g/dL — ABNORMAL LOW (ref 12.0–15.0)
MCH: 30.2 pg (ref 26.0–34.0)
MCHC: 34.3 g/dL (ref 30.0–36.0)
MCV: 88 fL (ref 78.0–100.0)
Platelets: 263 10*3/uL (ref 150–400)
RBC: 3.51 MIL/uL — ABNORMAL LOW (ref 3.87–5.11)
RDW: 14.4 % (ref 11.5–15.5)
WBC: 5.7 10*3/uL (ref 4.0–10.5)

## 2014-07-04 LAB — LIPID PANEL
Cholesterol: 153 mg/dL (ref 0–200)
HDL: 69 mg/dL (ref >39)
LDL Cholesterol: 65 mg/dL (ref 0–99)
Total CHOL/HDL Ratio: 2.2 RATIO
Triglycerides: 97 mg/dL (ref <150)
VLDL: 19 mg/dL (ref 0–40)

## 2014-07-04 LAB — TROPONIN I
Troponin I: <0.3 ng/mL (ref <0.30)
Troponin I: <0.3 ng/mL (ref <0.30)

## 2014-07-04 LAB — PROTIME-INR
INR: 1.27 (ref 0.00–1.49)
Prothrombin Time: 16 seconds — ABNORMAL HIGH (ref 11.6–15.2)

## 2014-07-04 LAB — HEMOGLOBIN A1C
Hgb A1c MFr Bld: 5.6 % (ref <5.7)
Mean Plasma Glucose: 114 mg/dL (ref <117)

## 2014-07-04 MED ORDER — TECHNETIUM TC 99M SESTAMIBI - CARDIOLITE
30.0000 | Freq: Once | INTRAVENOUS | Status: AC | PRN
  Administered 2014-07-04: 30 via INTRAVENOUS

## 2014-07-04 MED ORDER — REGADENOSON 0.4 MG/5ML IV SOLN
0.4000 mg | Freq: Once | INTRAVENOUS | Status: AC
  Administered 2014-07-04: 0.4 mg via INTRAVENOUS
  Filled 2014-07-04: qty 5

## 2014-07-04 MED ORDER — TECHNETIUM TC 99M SESTAMIBI - CARDIOLITE
10.0000 | Freq: Once | INTRAVENOUS | Status: AC | PRN
  Administered 2014-07-04: 10 via INTRAVENOUS

## 2014-07-04 MED ORDER — REGADENOSON 0.4 MG/5ML IV SOLN
INTRAVENOUS | Status: AC
  Filled 2014-07-04: qty 5

## 2014-07-04 NOTE — Progress Notes (Signed)
Utilization Review Completed.   Rasool Rommel, RN, BSN Nurse Case Manager  

## 2014-07-04 NOTE — Progress Notes (Signed)
Subjective: No further CP.  When she was having it, it was worse with deep breath.  Objective: Vital signs in last 24 hours: Temp:  [97.8 F (36.6 C)-98.3 F (36.8 C)] 98 F (36.7 C) (10/25 0400) Pulse Rate:  [58-84] 58 (10/25 0400) Resp:  [18-23] 18 (10/25 0400) BP: (80-113)/(55-80) 113/73 mmHg (10/25 0846) SpO2:  [93 %-99 %] 97 % (10/25 0400) Weight:  [175 lb 7.8 oz (79.6 kg)] 175 lb 7.8 oz (79.6 kg) (10/24 1754) Last BM Date: 07/03/14  Intake/Output from previous day:   Intake/Output this shift:    Medications Current Facility-Administered Medications  Medication Dose Route Frequency Provider Last Rate Last Dose  . acetaminophen (TYLENOL) tablet 650 mg  650 mg Oral Q4H PRN Arnoldo Lenis, MD      . aspirin EC tablet 81 mg  81 mg Oral Daily Arnoldo Lenis, MD   81 mg at 07/03/14 0900  . DULoxetine (CYMBALTA) DR capsule 60 mg  60 mg Oral Daily Arnoldo Lenis, MD      . Derrill Memo ON 07/05/2014] estradiol (CLIMARA - Dosed in mg/24 hr) patch 0.0375 mg  0.0375 mg Transdermal Weekly Arnoldo Lenis, MD      . excerise for heart and health book   Does not apply Once Arnoldo Lenis, MD      . heparin injection 5,000 Units  5,000 Units Subcutaneous 3 times per day Skeet Latch, MD   5,000 Units at 07/04/14 0537  . HYDROcodone-acetaminophen (NORCO/VICODIN) 5-325 MG per tablet 1 tablet  1 tablet Oral Q4H PRN Arnoldo Lenis, MD      . loratadine (CLARITIN) tablet 10 mg  10 mg Oral Daily Arnoldo Lenis, MD      . LORazepam (ATIVAN) tablet 1 mg  1 mg Oral BID PRN Arnoldo Lenis, MD   1 mg at 07/03/14 2133  . metoprolol succinate (TOPROL-XL) 24 hr tablet 37.5 mg  37.5 mg Oral BID Skeet Latch, MD   37.5 mg at 07/03/14 2132  . ondansetron (ZOFRAN) injection 4 mg  4 mg Intravenous Q6H PRN Arnoldo Lenis, MD      . ondansetron (ZOFRAN-ODT) disintegrating tablet 4 mg  4 mg Oral Q8H PRN Arnoldo Lenis, MD      . pantoprazole (PROTONIX) EC tablet 40 mg  40 mg  Oral Daily Arnoldo Lenis, MD      . regadenoson (LEXISCAN) 0.4 MG/5ML injection SOLN             PE: General appearance: alert, cooperative and no distress Lungs: clear to auscultation bilaterally Heart: irregularly irregular rhythm and No MM Extremities: No LEE Pulses: 2+ and symmetric Skin: Warm and dry Neurologic: Grossly normal  Lab Results:   Recent Labs  07/03/14 1020 07/04/14 0500  WBC 7.9 5.7  HGB 11.0* 10.6*  HCT 33.0* 30.9*  PLT 287 263   BMET  Recent Labs  07/03/14 1020 07/04/14 0500  NA 137 138  K 4.5 4.1  CL 104 104  CO2 22 20  GLUCOSE 126* 97  BUN 24* 19  CREATININE 1.19* 1.13*  CALCIUM 8.7 8.6   PT/INR  Recent Labs  07/04/14 0500  LABPROT 16.0*  INR 1.27   Cholesterol  Recent Labs  07/04/14 0528  CHOL 153   Lipid Panel     Component Value Date/Time   CHOL 153 07/04/2014 0528   TRIG 97 07/04/2014 0528   HDL 69 07/04/2014 0528   CHOLHDL 2.2 07/04/2014 0528  VLDL 19 07/04/2014 0528   LDLCALC 65 07/04/2014 0528   LDLDIRECT 89.1 06/29/2013 1604    Assessment/Plan   Active Problems:   Chest pain Ruled out for MI.  Lexiscan now.  She had a 3-4 second pause/bradycardia in to the 30's immediately after lexiscan was administered.  Hr quickly recovered.  LDL 65.  CTA chest: Small left pleural effusion layering dependently with  some associated passive atelectasis in the left lower lobe. There is  some patchy ground-glass attenuation in the lung bases bilaterally,  particularly in the lower lobes, where there is some associated  thickening of the peribronchovascular interstitium. 5 mm nodule in  the right middle lobe associated with the minor fissure (image 69 of  series 6) is unchanged,  Small left pleural effusion.    Hypotension, Chronic   Afib  Rate controlled on Eliquis.    LOS: 1 day    Aceson Labell PA-C 07/04/2014 9:09 AM

## 2014-07-04 NOTE — Discharge Summary (Signed)
Physician Discharge Summary     Cardiologist: Tamala Julian  Patient ID: Hailey [name] MRN: [id number] DOB/AGE: 04/27/1935 79 y.o.  Admit date: 07/03/2014 Discharge date: 07/04/2014  Admission Diagnoses:  Chest pain  Discharge Diagnoses:  Active Problems:   Chest pain   chronic hypotension   Atrial fibrillation  Discharged Condition: stable  Hospital Course:   Hailey [name] is a 79 y.o.female hx of parox afib on eliquis, HTN with occasional episodic hypotension, admitted with chest pain.  States symptoms started Wednesday night. She had completed a workout with her physical trainer (upper body weights, elliptical cardio) approx 6 hours prior without any troubles. States she typically works out 3 times a week but this was first time in few weeks because she had been out of town. All night Wed aching neck pain radiating to left shoulder, worst with movement and position. Continued all day Thursday. Friday developed aching midchest pain, worst with deep breaths and moving. No other associated symptoms. Chest pain lasted all day Friday, causing her to come int today for evaluation. States symptoms were somewhat improved with taking aleve at home.   Patient was admitted for further evaluation. In observation. She ruled out for myocardial infarction. 3 no ischemic changes on EKG. Dr. Harl Bowie did not think the presentation of pain was consistent with angina. Eliquis was held the night of admission incase invasive testing was required. Aspirin was started. Patient completed a Lexiscan Cardiolite stress test.  Telemetry revealed no ischemia and ejection fraction of 82%.  She had a CT angiogram of the chest and abdomen pelvis. This showed no acute abnormality of the thoracoabdominal aorta. Atherosclerosis, including left main and 3 vessel coronary artery disease.  Also small left pleural effusion.  Patchy areas of ground-glass attenuation and thickening of the peribronchovascular interstitium throughout the lower  lobes of the lungs bilaterally, suspicious for sequela of recent aspiration. Echocardiogram was also completed and revealed ejection fraction 60-65% wall motion was normal. See full report below. The patient was seen by Dr. Jacinta Shoe who felt she was stable for DC home.      Consults: None  Significant Diagnostic Studies: Echocardiogram Study Conclusions  - Left ventricle: The cavity size was normal. Wall thickness was increased in a pattern of mild LVH. Systolic function was normal. The estimated ejection fraction was in the range of 60% to 65%. Indeterminant diastolic function. Wall motion was normal; there were no regional wall motion abnormalities. - Aortic valve: Mildly calcified annulus. Trileaflet; mildly thickened leaflets. Valve area (VTI): 2.13 cm^2. Valve area (Vmax): 2.17 cm^2. - Mitral valve: There was mild regurgitation. - Left atrium: The atrium was mildly dilated. - Right atrium: The atrium was mildly dilated. - Technically adequate study.   Lipid Panel     Component Value Date/Time   CHOL 153 07/04/2014 0528   TRIG 97 07/04/2014 0528   HDL 69 07/04/2014 0528   CHOLHDL 2.2 07/04/2014 0528   VLDL 19 07/04/2014 0528   LDLCALC 65 07/04/2014 0528   LDLDIRECT 89.1 06/29/2013 1604   EXAM:  MYOCARDIAL IMAGING WITH SPECT (REST AND PHARMACOLOGIC-STRESS)  GATED LEFT VENTRICULAR WALL MOTION STUDY  LEFT VENTRICULAR EJECTION FRACTION  TECHNIQUE:  Standard myocardial SPECT imaging was performed after resting  intravenous injection of 10 mCi Tc-66m sestamibi. Subsequently,  intravenous infusion of Lexiscan was performed under the supervision  of the Cardiology staff. At peak effect of the drug, 30 mCi Tc-87m  sestamibi was injected intravenously and standard myocardial SPECT  imaging was performed. Quantitative gated imaging  was also performed  to evaluate left ventricular wall motion, and estimate left  ventricular ejection fraction.  COMPARISON: None.  FINDINGS:    Perfusion: Allowing for apical and inferior wall attenuation  artifact, No significant decreased activity in the left ventricle on  stress imaging to suggest reversible ischemia or infarction.  Wall Motion: Normal left ventricular wall motion. No left  ventricular dilation.  Left Ventricular Ejection Fraction: 82 %  End diastolic volume 55 ml  End systolic volume 10 ml  IMPRESSION:  1. No reversible ischemia or infarction.  2. Normal left ventricular wall motion.  3. Left ventricular ejection fraction 82%  4. Low-risk stress test findings*.  CT ANGIOGRAPHY CHEST, ABDOMEN AND PELVIS  TECHNIQUE:  Multidetector CT imaging through the chest, abdomen and pelvis was  performed using the standard protocol during bolus administration of  intravenous contrast. Multiplanar reconstructed images and MIPs were  obtained and reviewed to evaluate the vascular anatomy.  CONTRAST: 36mL OMNIPAQUE IOHEXOL 350 MG/ML SOLN  COMPARISON: Chest CT 02/03/2013.  FINDINGS:  CTA CHEST FINDINGS  Mediastinum: Heart size is normal. There is no significant  pericardial fluid, thickening or pericardial calcification. There is  atherosclerosis of the thoracic aorta, the great vessels of the  mediastinum and the coronary arteries, including calcified  atherosclerotic plaque in the left main, left anterior descending,  left circumflex and right coronary arteries. No evidence of aneurysm  or dissection in the thoracic aorta. Numerous prominent but non  pathologically enlarged mediastinal lymph nodes are conspicuous by  their number rather than their size (this is nonspecific). No  definite pathologically enlarged mediastinal lymph nodes are noted.  Esophagus is unremarkable in appearance.  Lungs/Pleura: Small left pleural effusion layering dependently with  some associated passive atelectasis in the left lower lobe. There is  some patchy ground-glass attenuation in the lung bases bilaterally,  particularly in the  lower lobes, where there is some associated  thickening of the peribronchovascular interstitium. 5 mm nodule in  the right middle lobe associated with the minor fissure (image 69 of  series 6) is unchanged, presumably a subpleural lymph node. No other  definite suspicious appearing pulmonary nodules or masses are noted.  Musculoskeletal: Old compression fractures of superior and inferior  endplates of B28 appear unchanged. The appearance of T10 vertebral  body suggest presence of a hemangioma. There are no aggressive  appearing lytic or blastic lesions noted in the visualized portions  of the skeleton.  CTA ABDOMEN and PELVIS FINDINGS  Hepatobiliary: No focal cystic or solid hepatic lesions. No intra or  extrahepatic biliary ductal dilatation. Gallbladder is normal in  appearance.  Pancreas: Fatty atrophy in the pancreas. No focal pancreatic  lesions.  Spleen: Unremarkable.  Adrenals/Urinary Tract: Exophytic 2.2 cm low-attenuation in the  upper pole of the left kidney, compatible with a simple cyst. There  is also an a 9 mm low attenuation lesion in the anterior aspect of  the interpolar region of the left kidney, compatible with a tiny  simple cyst. Right kidney is normal in appearance. No  hydroureteronephrosis or perinephric stranding to indicate urinary  tract obstruction at this time.  Stomach/Bowel: Normal appearance of the stomach. No pathologic  dilatation of the small bowel or colon. Extensive colonic  diverticulosis, particularly in the sigmoid colon, without  surrounding inflammatory changes to suggest an acute diverticulitis  at this time.  Vascular/Lymphatic: Mild atherosclerosis throughout the abdominal  and pelvic vasculature, without evidence of aneurysm, dissection or  major branch occlusion. Celiac axis,  SMA and the IMA are all widely  patent without hemodynamically significant stenosis. No  pathologically enlarged lymph nodes are noted in the abdomen or  pelvis.    Reproductive: Status post hysterectomy. Ovaries are not confidently  identified may be surgically absent or atrophic.  Other: Tiny umbilical hernia containing only a small amount of  omental fat. No significant volume of ascites. No pneumoperitoneum.  Musculoskeletal: There are no aggressive appearing lytic or blastic  lesions noted in the visualized portions of the skeleton. Multilevel  degenerative disc disease throughout the lumbar spine.  Review of the MIP images confirms the above findings.  IMPRESSION:  1. No acute abnormality of the thoracoabdominal aorta.  2. Small left pleural effusion layering dependently with some  associated passive atelectasis in the left lower lobe.  3. Patchy areas of ground-glass attenuation and thickening of the  peribronchovascular interstitium throughout the lower lobes of the  lungs bilaterally, suspicious for sequela of recent aspiration.  4. Mild cardiomegaly.  5. Atherosclerosis, including left main and 3 vessel coronary artery  disease. Assessment for potential risk factor modification, dietary  therapy or pharmacologic therapy may be warranted, if clinically  indicated.  6. Colonic diverticulosis without findings to suggest acute  diverticulitis at this time.  7. Normal appendix.  8. Tiny umbilical hernia containing only omental fat incidentally  noted.  9. Additional incidental findings, as above.    CHEST 2 VIEW  COMPARISON: 09/01/2013  FINDINGS:  There is opacity at the left lung base partly silhouetting the  posterior lateral costophrenic sulci. This may be atelectasis. A  small area of infiltrate is possible. There is milder linear and  reticular opacity at the right lung base that is consistent with  scarring and/ or atelectasis.  Remainder of the lungs is clear. No pneumothorax.  Cardiac silhouette is borderline enlarged. Aorta is mildly uncoiled.  No mediastinal or hilar masses.  Bony thorax is demineralized but grossly  intact.  IMPRESSION:  1. Small area of opacity in the left lower lobe at the posterior  lung base. This may reflect small pneumonia. It is more likely  atelectasis.  2. No other evidence of acute cardiopulmonary disease.   Treatments: See above  Discharge Exam: Blood pressure 113/66, pulse 68, temperature 98 F (36.7 C), temperature source Oral, resp. rate 18, height 5\' 4"  (1.626 m), weight 175 lb 7.8 oz (79.6 kg), SpO2 97.00%.   Disposition: 01-Home or Self Care      Discharge Instructions   Diet - low sodium heart healthy    Complete by:  As directed             Medication List         cetirizine 10 MG tablet  Commonly known as:  ZYRTEC  Take 10 mg by mouth daily.     DULoxetine 60 MG capsule  Commonly known as:  CYMBALTA  Take 1 capsule (60 mg total) by mouth daily.     ELIQUIS 5 MG Tabs tablet  Generic drug:  apixaban  Take 5 mg by mouth 2 (two) times daily.     esomeprazole 40 MG capsule  Commonly known as:  NEXIUM  Take 40 mg by mouth daily before breakfast.     estradiol 0.0375 mg/24hr patch  Commonly known as:  CLIMARA - Dosed in mg/24 hr  Place 1 patch (0.0375 mg total) onto the skin once a week.     LORazepam 1 MG tablet  Commonly known as:  ATIVAN  Take 1  mg by mouth 2 (two) times daily as needed for anxiety or sleep.     metoprolol succinate 25 MG 24 hr tablet  Commonly known as:  TOPROL-XL  Take 75 mg by mouth daily.     ondansetron 4 MG disintegrating tablet  Commonly known as:  ZOFRAN ODT  Take 1 tablet (4 mg total) by mouth every 8 (eight) hours as needed for nausea or vomiting.     PROBIOTIC PO  Take 1 capsule by mouth daily.       Follow-up Information   Follow up with Sinclair Grooms, MD. (The office will call you with the follow up appointment date and time)    Specialty:  Cardiology   Contact information:   [address] [place] [address] [phone]      Greater than 30 minutes was spent  completing the patient's discharge.   SignedTarri Fuller, PA-C 07/04/2014, 3:48 PM

## 2014-07-04 NOTE — Progress Notes (Addendum)
The patient was seen and examined, and I agree with the assessment and plan as documented above. No further episodes of chest pain today. Denies shortness of breath.  Will await results of echo and nuclear stress test. If unremarkable, will be able to d/c later today.  ADDENDUM: Nuclear MPI study normal. Echo showed normal LV systolic function. Pt can be discharged to home. Pt and husband informed.

## 2014-07-04 NOTE — Progress Notes (Signed)
  Echocardiogram 2D Echocardiogram has been performed.  Hailey [name] 07/04/2014, 11:22 AM

## 2014-07-04 NOTE — ED Provider Notes (Signed)
Medical screening examination/treatment/procedure(s) were conducted as a shared visit with non-physician practitioner(s) and myself.  I personally evaluated the patient during the encounter.   EKG Interpretation   Date/Time:  Saturday July 03 2014 09:55:35 EDT Ventricular Rate:  92 PR Interval:    QRS Duration: 76 QT Interval:  382 QTC Calculation: 472 R Axis:   13 Text Interpretation:  Atrial fibrillation Low voltage QRS Septal infarct ,  age undetermined Abnormal ECG Confirmed by Alvino Chapel  MD, Alegandra Sommers [id number])  on 07/03/2014 10:08:44 AM     Patient with chest pain. EKG overall reassuring. She is remained hypotensive which may be somewhat chronic. Pain goes to the back so CT angiography was obtained and does not show dissection or PE, however does show atherosclerosis and left main and other 3 coronary arteries. To be seen by cardiology and admitted  Hailey [name]. Alvino Chapel, MD 07/04/14 [phone]

## 2014-07-08 ENCOUNTER — Telehealth: Payer: Self-pay | Admitting: *Deleted

## 2014-07-08 NOTE — Telephone Encounter (Signed)
[facility] Triage Call Report Triage Record Num: [id number] Operator: Erline Hau Patient Name: Hailey [name] Call Date & Time: 07/03/2014 8:46:16AM Patient Phone: [phone] PCP: Walker Kehr Patient Gender: Female PCP Fax : [phone] Patient DOB: Jun 10, 1935 Practice Name: [facility] Reason for Call: Caller: Hailey [name]/Patient; PCP: Walker Kehr (Adults only); CB#: [phone]; Call regarding Pain in the neck and goes through her shoulder and to front chest. Hurts when she breathes.; Onset: 07/01/14 started with pain Thursday night and felt like it may be related to working out in gym. Chest pain with wovement and taking deep breath. Urgent symptom "Symptoms worse when lying down and better when sitting and leaning forward" per Chest Pain guideline. Disposition Emergency Room. Patient agrees to go to emergency room and questioned as to whether or not a call could be made to notify that she was coming. Agreed to give courtesy call and inform. Spoke to [facility] emergency room staff. Protocol(s) Used: Chest Pain Recommended Outcome per Protocol: See ED Immediately Reason for Outcome: Symptoms worse when lying down AND better when sitting and leaning forward Care Advice: ~ 10/

## 2014-07-12 ENCOUNTER — Encounter (HOSPITAL_COMMUNITY): Payer: Self-pay | Admitting: Emergency Medicine

## 2014-07-13 ENCOUNTER — Other Ambulatory Visit: Payer: Self-pay

## 2014-07-13 DIAGNOSIS — Z1231 Encounter for screening mammogram for malignant neoplasm of breast: Secondary | ICD-10-CM

## 2014-07-26 ENCOUNTER — Encounter: Payer: Self-pay | Admitting: Interventional Cardiology

## 2014-08-13 ENCOUNTER — Ambulatory Visit
Admission: RE | Admit: 2014-08-13 | Discharge: 2014-08-13 | Disposition: A | Discharge status: Home / Self Care | Payer: Medicare Other | Source: Ambulatory Visit

## 2014-08-13 DIAGNOSIS — Z1231 Encounter for screening mammogram for malignant neoplasm of breast: Secondary | ICD-10-CM

## 2014-08-16 ENCOUNTER — Other Ambulatory Visit: Payer: Self-pay | Admitting: Gynecology

## 2014-08-18 ENCOUNTER — Encounter: Payer: Self-pay | Admitting: Physician Assistant

## 2014-08-18 ENCOUNTER — Ambulatory Visit (INDEPENDENT_AMBULATORY_CARE_PROVIDER_SITE_OTHER): Payer: Medicare Other | Admitting: Physician Assistant

## 2014-08-18 VITALS — BP 120/90 | HR 68 | Ht 64.0 in | Wt 171.3 lb

## 2014-08-18 DIAGNOSIS — I482 Chronic atrial fibrillation, unspecified: Secondary | ICD-10-CM

## 2014-08-18 DIAGNOSIS — I10 Essential (primary) hypertension: Secondary | ICD-10-CM

## 2014-08-18 DIAGNOSIS — K219 Gastro-esophageal reflux disease without esophagitis: Secondary | ICD-10-CM

## 2014-08-18 DIAGNOSIS — I2510 Atherosclerotic heart disease of native coronary artery without angina pectoris: Secondary | ICD-10-CM

## 2014-08-18 LAB — CBC
HCT: 38.2 % (ref 36.0–46.0)
Hemoglobin: 12.5 g/dL (ref 12.0–15.0)
MCHC: 32.8 g/dL (ref 30.0–36.0)
MCV: 89.1 fl (ref 78.0–100.0)
Platelets: 266 10*3/uL (ref 150.0–400.0)
RBC: 4.29 Mil/uL (ref 3.87–5.11)
RDW: 14.9 % (ref 11.5–15.5)
WBC: 7.5 10*3/uL (ref 4.0–10.5)

## 2014-08-18 MED ORDER — PRAVASTATIN SODIUM 20 MG PO TABS: ORAL_TABLET | ORAL | Status: DC

## 2014-08-18 NOTE — Patient Instructions (Signed)
START PRAVASTATIN 20 MG ON MON, WED and FRI'S AT BEDTIME  LAB WORK TODAY; CBC,   FASTING LIPID AND LIVER PANEL TO BE DONE IN ABOUT 6-8 WEEKS  FOLLOW UP WITH DR. Tamala Julian 12/2014

## 2014-08-18 NOTE — Progress Notes (Signed)
Cardiology Office Note   Date:  08/18/2014   ID:  Birdell, Frasier 1935-04-18, MRN [id number]  PCP:  Walker Kehr, MD  Cardiologist:  Dr. Daneen Schick     History of Present Illness: Hailey [name] is a 79 y.o. female with a history of chronic AFib on Eliquis for anticoagulation, HTN. She was admitted 06/2014 with atypical chest pain. CT of the chest did demonstrate left main and three-vessel CAD. She was observed overnight. Cardiac enzymes remained negative. Echocardiogram demonstrated normal LV function. Inpatient nuclear study demonstrated no signs of ischemia or infarction.  No further testing was indicated.  She now returns for FU.    She is doing well. She denies exertional chest discomfort. She has significant indigestion. This is associated with meals. She has seen gastroenterology (Dr. Earlean Shawl). She has been started on Nexium. She's had some relief. She denies melena or hematochezia. She does note some dyspnea with exertion. She is NYHA 2. She denies orthopnea, PND or edema. She denies significant change in her dyspnea. She denies syncope. She does feel that her blood pressure runs low at times and she is lightheaded with this.   Studies:   - Echo (10/15):  Mild LVH, EF 60-65%, normal wall motion, mild MR, mild BAE  - Nuclear (10/15):  No ischemia or infarction, EF 82%; LOW RISK   Recent Labs: 01/25/2014: TSH 1.37 07/04/2014: ALT 8; BUN 19; Creatinine 1.13*; Hemoglobin 10.6*; LDL (calc) 65; Potassium 4.1; Sodium 138    Recent Radiology: Chest CTA (10/15): IMPRESSION: 1. No acute abnormality of the thoracoabdominal aorta. 2. Small left pleural effusion layering dependently with some associated passive atelectasis in the left lower lobe. 3. Patchy areas of ground-glass attenuation and thickening of the peribronchovascular interstitium throughout the lower lobes of the lungs bilaterally, suspicious for sequela of recent aspiration. 4. Mild cardiomegaly. 5.  Atherosclerosis, including left main and 3 vessel coronary artery disease. Assessment for potential risk factor modification, dietary therapy or pharmacologic therapy may be warranted, if clinically indicated. 6. Colonic diverticulosis without findings to suggest acute diverticulitis at this time. 7. Normal appendix. 8. Tiny umbilical hernia containing only omental fat incidentally noted. 9. Additional incidental findings, as above.  Wt Readings from Last 3 Encounters:  08/18/14 171 lb 5.1 oz (77.71 kg)  07/03/14 175 lb 7.8 oz (79.6 kg)  12/30/13 177 lb (80.287 kg)     Past Medical History  Diagnosis Date  . History of colon polyps   . GERD (gastroesophageal reflux disease)   . HTN (hypertension)   . SVT (supraventricular tachycardia)     brief history  . PAC (premature atrial contraction)     Symptomatiic  . Anxiety   . Allergic rhinitis   . IBS (irritable bowel syndrome)     constipation predominant - Dr Earlean Shawl  . Diverticulosis of colon   . Hip bursitis 2010    Dr Para March, Post op seroma  . Osteopenia 12/2011    T score -1.3 FRAX 10%/1.9%  . Atrial fibrillation   . Lichen sclerosus     Current Outpatient Prescriptions  Medication Sig Dispense Refill  . apixaban (ELIQUIS) 5 MG TABS tablet Take 5 mg by mouth 2 (two) times daily.    . cetirizine (ZYRTEC) 10 MG tablet Take 10 mg by mouth daily as needed.     . DULoxetine (CYMBALTA) 60 MG capsule Take 1 capsule (60 mg total) by mouth daily. 90 capsule 1  . esomeprazole (NEXIUM) 40 MG capsule Take 40 mg  by mouth daily before breakfast.      . estradiol (CLIMARA - DOSED IN MG/24 HR) 0.0375 mg/24hr patch Place 1 patch (0.0375 mg total) onto the skin once a week. 4 patch 2  . LORazepam (ATIVAN) 1 MG tablet Take 1 mg by mouth 2 (two) times daily as needed for anxiety or sleep.    . metoprolol succinate (TOPROL-XL) 25 MG 24 hr tablet Take 75 mg by mouth daily.    . Probiotic Product (PROBIOTIC PO) Take 1 capsule by mouth daily.       No current facility-administered medications for this visit.     Allergies:   Meloxicam and Sulfamethoxazole-trimethoprim   Social History:  The patient  reports that she quit smoking about 38 years ago. She has never used smokeless tobacco. She reports that she drinks about 3.0 oz of alcohol per week. She reports that she does not use illicit drugs.   Family History:  The patient's family history includes Cancer in her brother and father; Colon cancer in her mother; Diabetes in her father. There is no history of Heart attack or Stroke.    ROS:  Please see the history of present illness.      All other systems reviewed and negative.    PHYSICAL EXAM: VS:  BP 120/90 mmHg  Pulse 68  Ht 5\' 4"  (1.626 m)  Wt 171 lb 5.1 oz (77.71 kg)  BMI 29.39 kg/m2 Well nourished, well developed, in no acute distress HEENT: normal Neck: No JVD Cardiac:  normal S1, S2; irregularly irregular rhythm ; no murmur  Lungs:  clear to auscultation bilaterally, no wheezing, rhonchi or rales Abd: soft, nontender, no hepatomegaly Ext: No edema Skin: warm and dry Neuro:  CNs 2-12 intact, no focal abnormalities noted  EKG:  Atrial fibrillation, HR 68, no change from prior tracing      ASSESSMENT AND PLAN:  1.  Chronic atrial fibrillation: Rate controlled. She is tolerating Eliquis. Hemoglobin during her recent hospital stay was reduced. Repeat CBC will be obtained today. Continue current dose of beta blocker. 2.  Coronary artery calcification seen on CT scan:  Recent Myoview low risk. However, she would benefit from statin therapy. Recent LDL was less than 70.    -  Pravastatin 20 mg daily at bedtime on Monday, Wednesday, Friday only    -  Lipids and LFTs in 6-8 weeks 3.  Essential hypertension: Controlled. Blood pressure runs low at times. I have asked her to keep an eye on her blood pressure and apprise [place] if it continues to run low. 4.  Gastroesophageal reflux disease: She continues to have symptoms of  indigestion despite Nexium therapy. I have asked her to follow-up with [facility] urology if she does not achieve significant control.  Disposition:  FU with Dr. Daneen Schick in 12/2014.    Signed, Versie Starks, MHS 08/18/2014 10:35 AM    [facility] [place], [place], [place]  [address] Phone: [phone]; Fax: [phone]

## 2014-08-20 ENCOUNTER — Telehealth: Payer: Self-pay | Admitting: *Deleted

## 2014-08-20 NOTE — Telephone Encounter (Signed)
lmom lab work normal on voice mail that identified pt by name

## 2014-08-30 ENCOUNTER — Other Ambulatory Visit (INDEPENDENT_AMBULATORY_CARE_PROVIDER_SITE_OTHER): Payer: Medicare Other

## 2014-08-30 ENCOUNTER — Telehealth: Payer: Self-pay | Admitting: *Deleted

## 2014-08-30 ENCOUNTER — Ambulatory Visit (INDEPENDENT_AMBULATORY_CARE_PROVIDER_SITE_OTHER): Payer: Medicare Other | Admitting: Internal Medicine

## 2014-08-30 ENCOUNTER — Encounter: Payer: Self-pay | Admitting: Internal Medicine

## 2014-08-30 VITALS — BP 150/90 | HR 58 | Temp 97.4°F | Resp 16 | Ht 64.0 in | Wt 172.0 lb

## 2014-08-30 DIAGNOSIS — R10814 Left lower quadrant abdominal tenderness: Secondary | ICD-10-CM

## 2014-08-30 DIAGNOSIS — K5732 Diverticulitis of large intestine without perforation or abscess without bleeding: Secondary | ICD-10-CM

## 2014-08-30 LAB — URINALYSIS, ROUTINE W REFLEX MICROSCOPIC
Bilirubin Urine: NEGATIVE
Hgb urine dipstick: NEGATIVE
Ketones, ur: NEGATIVE
Leukocytes, UA: NEGATIVE
Nitrite: NEGATIVE
Specific Gravity, Urine: 1.02 (ref 1.000–1.030)
Total Protein, Urine: NEGATIVE
Urine Glucose: NEGATIVE
Urobilinogen, UA: 0.2 (ref 0.0–1.0)
WBC, UA: NONE SEEN (ref 0–?)
pH: 6 (ref 5.0–8.0)

## 2014-08-30 LAB — AMYLASE: Amylase: 75 U/L (ref 27–131)

## 2014-08-30 LAB — CBC WITH DIFFERENTIAL/PLATELET
Basophils Absolute: 0 10*3/uL (ref 0.0–0.1)
Basophils Relative: 0.5 % (ref 0.0–3.0)
Eosinophils Absolute: 0.3 10*3/uL (ref 0.0–0.7)
Eosinophils Relative: 4 % (ref 0.0–5.0)
HCT: 37.1 % (ref 36.0–46.0)
Hemoglobin: 12 g/dL (ref 12.0–15.0)
Lymphocytes Relative: 17.9 % (ref 12.0–46.0)
Lymphs Abs: 1.3 10*3/uL (ref 0.7–4.0)
MCHC: 32.3 g/dL (ref 30.0–36.0)
MCV: 89.8 fl (ref 78.0–100.0)
Monocytes Absolute: 0.6 10*3/uL (ref 0.1–1.0)
Monocytes Relative: 8.9 % (ref 3.0–12.0)
Neutro Abs: 5 10*3/uL (ref 1.4–7.7)
Neutrophils Relative %: 68.7 % (ref 43.0–77.0)
Platelets: 274 10*3/uL (ref 150.0–400.0)
RBC: 4.13 Mil/uL (ref 3.87–5.11)
RDW: 14.8 % (ref 11.5–15.5)
WBC: 7.2 10*3/uL (ref 4.0–10.5)

## 2014-08-30 LAB — COMPREHENSIVE METABOLIC PANEL
ALT: 13 U/L (ref 0–35)
AST: 25 U/L (ref 0–37)
Albumin: 3.8 g/dL (ref 3.5–5.2)
Alkaline Phosphatase: 65 U/L (ref 39–117)
BUN: 26 mg/dL — ABNORMAL HIGH (ref 6–23)
CO2: 26 mEq/L (ref 19–32)
Calcium: 8.7 mg/dL (ref 8.4–10.5)
Chloride: 101 mEq/L (ref 96–112)
Creatinine, Ser: 1.2 mg/dL (ref 0.4–1.2)
GFR: 44.91 mL/min — ABNORMAL LOW (ref >60.00)
Glucose, Bld: 88 mg/dL (ref 70–99)
Potassium: 4.2 mEq/L (ref 3.5–5.1)
Sodium: 134 mEq/L — ABNORMAL LOW (ref 135–145)
Total Bilirubin: 0.6 mg/dL (ref 0.2–1.2)
Total Protein: 6.9 g/dL (ref 6.0–8.3)

## 2014-08-30 LAB — C-REACTIVE PROTEIN: CRP: 0.7 mg/dL (ref 0.5–20.0)

## 2014-08-30 LAB — LIPASE: Lipase: 43 U/L (ref 11.0–59.0)

## 2014-08-30 LAB — SEDIMENTATION RATE: Sed Rate: 31 mm/hr — ABNORMAL HIGH (ref 0–22)

## 2014-08-30 MED ORDER — CIPROFLOXACIN HCL 500 MG PO TABS: 500.0000 mg | ORAL_TABLET | Freq: Two times a day (BID) | ORAL | Status: DC

## 2014-08-30 MED ORDER — METRONIDAZOLE 250 MG PO TABS: 250.0000 mg | ORAL_TABLET | Freq: Three times a day (TID) | ORAL | Status: DC

## 2014-08-30 NOTE — Progress Notes (Signed)
Subjective:    Patient ID: Hailey [name], female    DOB: Feb 28, 1935, 79 y.o.   MRN: [id number]  Abdominal Pain This is a new problem. The current episode started yesterday. The onset quality is gradual. The problem occurs intermittently. The problem has been unchanged. The pain is located in the LLQ. The pain is at a severity of 1/10. The pain is mild. The quality of the pain is aching. The abdominal pain does not radiate. Pertinent negatives include no anorexia, arthralgias, belching, constipation, diarrhea, dysuria, fever, flatus, frequency, headaches, hematochezia, hematuria, melena, myalgias, nausea, vomiting or weight loss. Nothing aggravates the pain. The pain is relieved by nothing. She has tried nothing for the symptoms. There is no history of abdominal surgery, colon cancer, Crohn's disease, gallstones, GERD, irritable bowel syndrome, pancreatitis, PUD or ulcerative colitis.      Review of Systems  Constitutional: Negative.  Negative for fever, chills, weight loss, diaphoresis and fatigue.  HENT: Negative.   Eyes: Negative.   Respiratory: Negative.   Cardiovascular: Negative.  Negative for chest pain, palpitations and leg swelling.  Gastrointestinal: Positive for abdominal pain. Negative for nausea, vomiting, diarrhea, constipation, blood in stool, melena, hematochezia, abdominal distention, anal bleeding, rectal pain, anorexia and flatus.  Endocrine: Negative.   Genitourinary: Negative.  Negative for dysuria, urgency, frequency, hematuria, flank pain, decreased urine volume, enuresis and difficulty urinating.  Musculoskeletal: Negative.  Negative for myalgias and arthralgias.  Skin: Negative.   Allergic/Immunologic: Negative.   Neurological: Negative.  Negative for headaches.  Hematological: Negative.  Negative for adenopathy. Does not bruise/bleed easily.  Psychiatric/Behavioral: Negative.        Objective:   Physical Exam  Constitutional: She is oriented to person,  place, and time. She appears well-developed and well-nourished.  Non-toxic appearance. She does not have a sickly appearance. She does not appear ill. No distress.  HENT:  Head: Normocephalic and atraumatic.  Mouth/Throat: Oropharynx is clear and moist. No oropharyngeal exudate.  Eyes: Conjunctivae are normal. Right eye exhibits no discharge. Left eye exhibits no discharge. No scleral icterus.  Neck: Normal range of motion. Neck supple. No JVD present. No tracheal deviation present. No thyromegaly present.  Cardiovascular: Normal rate, regular rhythm, normal heart sounds and intact distal pulses.  Exam reveals no gallop and no friction rub.   No murmur heard. Pulmonary/Chest: Effort normal and breath sounds normal. No stridor. No respiratory distress. She has no wheezes. She has no rales. She exhibits no tenderness.  Abdominal: Soft. Normal appearance and bowel sounds are normal. She exhibits no distension and no mass. There is no hepatosplenomegaly, splenomegaly or hepatomegaly. There is tenderness in the left lower quadrant. There is no rigidity, no rebound, no guarding, no CVA tenderness, no tenderness at McBurney's point and negative Murphy's sign. No hernia. Hernia confirmed negative in the ventral area, confirmed negative in the right inguinal area and confirmed negative in the left inguinal area.  Musculoskeletal: Normal range of motion. She exhibits no edema or tenderness.  Lymphadenopathy:    She has no cervical adenopathy.  Neurological: She is oriented to person, place, and time.  Skin: Skin is warm and dry. No rash noted. She is not diaphoretic. No erythema. No pallor.  Vitals reviewed.   Lab Results  Component Value Date   WBC 7.5 08/18/2014   HGB 12.5 08/18/2014   HCT 38.2 08/18/2014   PLT 266.0 08/18/2014   GLUCOSE 97 07/04/2014   CHOL 153 07/04/2014   TRIG 97 07/04/2014   HDL 69 07/04/2014  LDLDIRECT 89.1 06/29/2013   LDLCALC 65 07/04/2014   ALT 8 07/04/2014   AST 14  07/04/2014   NA 138 07/04/2014   K 4.1 07/04/2014   CL 104 07/04/2014   CREATININE 1.13* 07/04/2014   BUN 19 07/04/2014   CO2 20 07/04/2014   TSH 1.37 01/25/2014   INR 1.27 07/04/2014   HGBA1C 5.6 07/04/2014        Assessment & Plan:

## 2014-08-30 NOTE — Patient Instructions (Signed)

## 2014-08-30 NOTE — Assessment & Plan Note (Signed)
This does not appear to be a very serious infection based on s/s Will start empiric antibiotics with cipro and flagyl If her CRP is high then may consider getting a CT scan done

## 2014-08-30 NOTE — Telephone Encounter (Signed)
[facility] - Client [facility] Patient Name: Hailey [name] Gender: Female DOB: [birth year] Age: 79 Y 9 M 5 D Return Phone Number: [phone] (Primary), [phone] (Secondary) Address: [address] City/State/Zip: [place] [address] Client [facility] - Client Client Site [place] - Day Physician Plotnikov, Alex Contact Type Call Call Type Triage / Clinical Relationship To Patient Self Return Phone Number [phone]) [phone] (Primary) Chief Complaint Abdominal Pain Initial Comment Caller states c/o left side abdominal pain last night PreDisposition Did not know what to do Nurse Assessment Nurse: Leilani Merl, RN, Heather Date/Time (Eastern Time): 08/30/2014 9:58:29 AM Confirm and document reason for call. If symptomatic, describe symptoms. ---Caller states c/o left side abdominal pain last night, it was tender to the touch, it was off and on and this morning she is still having tenderness when she touches the area and when she moves a certain way. Caller states that her GI doctor is not in and neither is her PCP. She is concerned that this may be an attack of diverticulitis and wants medication called in. Has the patient traveled out of the country within the last 30 days? ---Not Applicable Does the patient require triage? ---Yes Related visit to physician within the last 2 weeks? ---No Does the PT have any chronic conditions? (i.e. diabetes, asthma, etc.) ---Yes List chronic conditions. ---diverticulitis Guidelines Guideline Title Affirmed Question Affirmed Notes Nurse Date/Time Eilene Ghazi Time) Abdominal Pain - Female Age > 68 years Standifer, RN, Nira Conn 08/30/2014 10:00:52 AM Disp. Time Eilene Ghazi Time) Disposition Final User 08/30/2014 9:53:38 AM Send To Clinical Follow Up Rich Brave, Amy 08/30/2014 10:06:11 AM Call Completed Standifer, RN, Nira Conn 08/30/2014 10:05:46 AM See  Physician within 24 Hours Yes Standifer, RN, Soyla Murphy Understands: Yes Disagree/Comply: Comply

## 2014-08-30 NOTE — Assessment & Plan Note (Signed)
She has mild pain and a relatively benign PE with no systemic complaints - will treat for diverticulitis with cipro and flagyl. Will also check for renal stones, pancreatitis, hepatitis, and UTI with labs.

## 2014-08-31 ENCOUNTER — Ambulatory Visit: Payer: Medicare Other | Admitting: Family

## 2014-09-01 ENCOUNTER — Telehealth: Payer: Self-pay | Admitting: Internal Medicine

## 2014-09-01 ENCOUNTER — Telehealth: Payer: Self-pay | Admitting: *Deleted

## 2014-09-01 NOTE — Telephone Encounter (Signed)
Stop the flagyl and take the cipro

## 2014-09-01 NOTE — Telephone Encounter (Signed)
Pt called stated that Dr. Ronnald Ramp gave Cipro and Metronidazole to take on 12.21.15, pt stated these two med make her nausea,not feel good. Please advise, pt think this might be too much for her.

## 2014-09-01 NOTE — Telephone Encounter (Signed)
[facility] - Client [facility] Patient Name: Hailey [name] Gender: Female DOB: 16-Feb-1935 Age: 79 Y 48 M 7 D Return Phone Number: [phone] (Primary), [phone] (Secondary) Address: [address] City/State/Zip: [place] [address] Client [facility] - Client Client Site [facility] - Day Physician Jones, [place] Type Call Call Type Triage / Clinical Relationship To Patient Self Return Phone Number [phone]) [phone] (Primary) Chief Complaint Medication reaction Initial Comment Caller states she was in the office for Diverticulitis issues, the RX's seem to be making her feel worse. PreDisposition Did not know what to do Nurse Assessment Nurse: Donalynn Furlong, RN, Myna Hidalgo Date/Time Eilene Ghazi Time): 09/01/2014 9:25:36 AM Confirm and document reason for call. If symptomatic, describe symptoms. ---Caller states she was in the office for Diverticulitis issues, the RX's seem to be making her feel worse.Buddy Duty both of these medicines Monday ( saw Dr Scarlette Calico that day)" for a diverticulitis flare-up "Metronizadole 250 mg 3x a day w food for 7 days, Ciproflaxin 500 mg BID, x 7 days. Pt states the meds are making her nauseous, loss of appetite. Wants to know about changing them if possible. Has the patient traveled out of the country within the last 30 days? ---No Does the patient require triage? ---Yes Related visit to physician within the last 2 weeks? ---Yes Does the PT have any chronic conditions? (i.e. diabetes, asthma, etc.) ---Yes List chronic conditions. ---diverticulits, afib, hypertension Guidelines Guideline Title Affirmed Question Affirmed Notes Nurse Date/Time (Eastern Time) Nausea Taking prescription medication that could cause nausea (e.g., narcotics/opiates, antibiotics, OCPs, many others) Donalynn Furlong, RN, Myna Hidalgo 09/01/2014 9:43:28 AM Disp. Time Eilene Ghazi Time)  Disposition Final User 09/01/2014 9:08:37 AM Attempt made - message left Donalynn Furlong, RN, Myna Hidalgo 09/01/2014 9:47:49 AM Paged On Call to Patient Donalynn Furlong, RN, Myna Hidalgo PLEASE NOTE: All timestamps contained within this report are represented as [place] Standard Time. CONFIDENTIALTY NOTICE: This fax transmission is intended only for the addressee. It contains information that is legally privileged, confidential or otherwise protected from use or disclosure. If you are not the intended recipient, you are strictly prohibited from reviewing, disclosing, copying using or disseminating any of this information or taking any action in reliance on or regarding this information. If you have received this fax in error, please notify us immediately by telephone so that we can arrange for its return to [place]. Phone: [phone], Toll-Free: [phone], Fax: [phone] Page: 2 of 2 Call Id: [id number] [place]. Time Eilene Ghazi Time) Disposition Final User Reason: TC to PCP, Dr Ronnald Ramp, to inform office of pt med complaints 09/01/2014 9:44:44 AM Call PCP within 24 Hours Yes Donalynn Furlong, RN, Myna Hidalgo Caller Understands: Yes Disagree/Comply: Comply Care Advice Given Per Guideline CALL PCP WITHIN 24 HOURS: You need to discuss this with your doctor within the next 24 hours. CALL BACK IF: * You become worse. CARE ADVICE given per Nausea (Adult) guideline. * Follow the instructions included in the package. * Call if vomiting a prescription medicine. After Care Instructions Given Call Event Type User Date / Time Description Comments User: Gennie Alma, RN Date/Time Eilene Ghazi Time): 09/01/2014 9:46:51 AM tc to Dr Ronnald Ramp office. Physician(as per staff) will review pt c/o with meds prescribed. Staff member "Laurey Arrow", states Dr Ronnald Ramp assistant will call pt back after she speaks with Dr Ronnald Ramp Referrals REFERRED TO PCP OFFICE

## 2014-09-01 NOTE — Telephone Encounter (Signed)
Notified pt with md response.../lmb 

## 2014-09-13 ENCOUNTER — Other Ambulatory Visit: Payer: Self-pay | Admitting: Gynecology

## 2014-09-21 ENCOUNTER — Ambulatory Visit (INDEPENDENT_AMBULATORY_CARE_PROVIDER_SITE_OTHER): Payer: Medicare Other | Admitting: Internal Medicine

## 2014-09-21 ENCOUNTER — Encounter: Payer: Self-pay | Admitting: Internal Medicine

## 2014-09-21 VITALS — BP 140/98 | HR 66 | Temp 97.4°F | Wt 174.0 lb

## 2014-09-21 DIAGNOSIS — N259 Disorder resulting from impaired renal tubular function, unspecified: Secondary | ICD-10-CM

## 2014-09-21 DIAGNOSIS — I10 Essential (primary) hypertension: Secondary | ICD-10-CM

## 2014-09-21 DIAGNOSIS — K5730 Diverticulosis of large intestine without perforation or abscess without bleeding: Secondary | ICD-10-CM

## 2014-09-21 DIAGNOSIS — K5732 Diverticulitis of large intestine without perforation or abscess without bleeding: Secondary | ICD-10-CM

## 2014-09-21 MED ORDER — METRONIDAZOLE 500 MG PO TABS: 500.0000 mg | ORAL_TABLET | Freq: Two times a day (BID) | ORAL | Status: DC

## 2014-09-21 MED ORDER — PROMETHAZINE HCL 12.5 MG PO TABS: 12.5000 mg | ORAL_TABLET | Freq: Three times a day (TID) | ORAL | Status: DC | PRN

## 2014-09-21 MED ORDER — TRAZODONE HCL 50 MG PO TABS: 25.0000 mg | ORAL_TABLET | Freq: Every evening | ORAL | Status: DC | PRN

## 2014-09-21 MED ORDER — CIPROFLOXACIN HCL 500 MG PO TABS: 500.0000 mg | ORAL_TABLET | Freq: Two times a day (BID) | ORAL | Status: DC

## 2014-09-21 NOTE — Progress Notes (Signed)
   Subjective:   Abdominal Pain   F/u diverticulitis (10/15 in [place])  The patient is here for a wellness exam. The patient has been doing well overall without major physical or psychological issues going on lately. Facial contusion is recovering.  The patient needs to address  chronic hypertension that has been well controlled with medicine  F/u bruising - better F/u elev. BP  F/u R hip pain.  Review of Systems  Constitutional: Negative for appetite change and fatigue.  HENT: Negative for tinnitus.   Eyes: Negative for pain.  Respiratory: Negative for chest tightness and shortness of breath.   Cardiovascular: Negative for chest pain.  Gastrointestinal: Positive for abdominal pain.  Musculoskeletal: Negative for back pain and neck pain.  Neurological: Negative for dizziness, speech difficulty and weakness.  Hematological: Negative for adenopathy. Bruises/bleeds easily.  Psychiatric/Behavioral: Negative for behavioral problems and dysphoric mood.   Wt Readings from Last 3 Encounters:  09/21/14 174 lb (78.926 kg)  08/30/14 172 lb (78.019 kg)  08/18/14 171 lb 5.1 oz (77.71 kg)       BP Readings from Last 3 Encounters:  09/21/14 140/98  08/30/14 150/90  08/18/14 120/90    Objective:   Physical Exam  Constitutional: She appears well-developed. No distress.  HENT:  Head: Normocephalic.  Right Ear: External ear normal.  Left Ear: External ear normal.  Nose: Nose normal.  Mouth/Throat: Oropharynx is clear and moist.  Eyes: Conjunctivae are normal. Pupils are equal, round, and reactive to light. Right eye exhibits no discharge. Left eye exhibits no discharge.  Neck: Normal range of motion. Neck supple. No JVD present. No tracheal deviation present. No thyromegaly present.  Cardiovascular: Normal rate, regular rhythm and normal heart sounds.   Pulmonary/Chest: No stridor. No respiratory distress. She has no wheezes.  Abdominal: Soft. Bowel sounds are normal. She exhibits  no distension and no mass. There is no tenderness. There is no rebound and no guarding.  Musculoskeletal: She exhibits no edema or tenderness.  Lymphadenopathy:    She has no cervical adenopathy.  Neurological: She displays normal reflexes. No cranial nerve deficit. She exhibits normal muscle tone. Coordination normal.  Skin: No rash noted. No erythema.  Psychiatric: She has a normal mood and affect. Her behavior is normal. Judgment and thought content normal.      Lab Results  Component Value Date   WBC 7.2 08/30/2014   HGB 12.0 08/30/2014   HCT 37.1 08/30/2014   PLT 274.0 08/30/2014   GLUCOSE 88 08/30/2014   CHOL 153 07/04/2014   TRIG 97 07/04/2014   HDL 69 07/04/2014   LDLDIRECT 89.1 06/29/2013   LDLCALC 65 07/04/2014   ALT 13 08/30/2014   AST 25 08/30/2014   NA 134* 08/30/2014   K 4.2 08/30/2014   CL 101 08/30/2014   CREATININE 1.2 08/30/2014   BUN 26* 08/30/2014   CO2 26 08/30/2014   TSH 1.37 01/25/2014   INR 1.27 07/04/2014   HGBA1C 5.6 07/04/2014       Assessment & Plan:

## 2014-09-21 NOTE — Progress Notes (Signed)
Pre visit review using our clinic review tool, if applicable. No additional management support is needed unless otherwise documented below in the visit note. 

## 2014-09-23 NOTE — Assessment & Plan Note (Signed)
Resolved Abx Rx for travel Diet discussed

## 2014-09-23 NOTE — Assessment & Plan Note (Signed)
Continue with current prescription therapy as reflected on the Med list.  

## 2014-09-23 NOTE — Assessment & Plan Note (Signed)
Monitor BMET. 

## 2014-09-24 ENCOUNTER — Ambulatory Visit (INDEPENDENT_AMBULATORY_CARE_PROVIDER_SITE_OTHER): Payer: Medicare Other | Admitting: Gynecology

## 2014-09-24 ENCOUNTER — Encounter: Payer: Self-pay | Admitting: Gynecology

## 2014-09-24 VITALS — BP 120/82 | Ht 63.5 in | Wt 173.0 lb

## 2014-09-24 DIAGNOSIS — Z79890 Hormone replacement therapy (postmenopausal): Secondary | ICD-10-CM

## 2014-09-24 DIAGNOSIS — Z01419 Encounter for gynecological examination (general) (routine) without abnormal findings: Secondary | ICD-10-CM

## 2014-09-24 DIAGNOSIS — N952 Postmenopausal atrophic vaginitis: Secondary | ICD-10-CM

## 2014-09-24 DIAGNOSIS — L900 Lichen sclerosus et atrophicus: Secondary | ICD-10-CM

## 2014-09-24 MED ORDER — ESTRADIOL 0.0375 MG/24HR TD PTWK: MEDICATED_PATCH | TRANSDERMAL | Status: DC

## 2014-09-24 NOTE — Progress Notes (Signed)
INDIAH HEYDEN 24-May-1935 [id number]        80 y.o.  G2P2002 for breast and pelvic exam. Several issues noted below.  Past medical history,surgical history, problem list, medications, allergies, family history and social history were all reviewed and documented as reviewed in the EPIC chart.  ROS:  Performed with pertinent positives and negatives included in the history, assessment and plan.   Additional significant findings :  none   Exam: Kim Counsellor Vitals:   09/24/14 1219  BP: 120/82  Height: 5' 3.5" (1.613 m)  Weight: 173 lb (78.472 kg)   General appearance:  Normal affect, orientation and appearance. Skin: Grossly normal HEENT: Without gross lesions.  No cervical or supraclavicular adenopathy. Thyroid normal.  Lungs:  Clear without wheezing, rales or rhonchi Cardiac: RR, without RMG Abdominal:  Soft, nontender, without masses, guarding, rebound, organomegaly or hernia Breasts:  Examined lying and sitting without masses, retractions, discharge or axillary adenopathy. Pelvic:  Ext/BUS/vagina with generalized atrophic changes  Adnexa  Without masses or tenderness    Anus and perineum  Normal   Rectovaginal  Normal sphincter tone without palpated masses or tenderness.    Assessment/Plan:  80 y.o. G20P2002 female for breast and pelvic exam.   1. Postmenopausal/atrophic genital changes/HRT. Status post TVH BSO. Currently on estradiol patch 0.0375.  I again reviewed the whole issue of HRT with her to include the WHI study with increased risk of stroke, heart attack, DVT and breast cancer. The ACOG and NAMS statements for lowest dose for the shortest period of time reviewed. Transdermal versus oral first-pass effect benefit discussed.  The issues of being in her 17s and continuing on ERT. The issues of on anticoagulation for A. Fib and being on  ERT with possible increased thrombosis risk. Options to wean versus continuing discussed. I did refill her 1 year at her request and  she will decide if she was to try to wean this coming year. Although she is on a very low dose she does recognize the potential for risks. 2. Osteopenia. DEXA 12/2011 T score -1.3. FRAX 10%/1.9%. She is on low-dose ERT. Will plan to repeat her DEXA in another year or 2. Increased calcium vitamin D reviewed. 3. Lichen sclerosis. Patient uses occasional Temovate 0.05% cream as needed for itching. Uses it sparingly with good results. Has a tube at home and will call when she needs more. 4. Pap smear 2011. No Pap smear done today. No history of significant abnormal Pap smears. Status post hysterectomy for benign indications. Over the age of 60. We discussed current screening guidelines and are both comfortable with stop screening. 5. Mammography 08/2014. Continue with annual mammography. SBE monthly reviewed. 6. Colonoscopy 2013. Repeat at their recommended interval. 7. Health maintenance. No routine blood work done as she reports this done at her primary physician's office. Follow up 1 year, sooner as needed.     Anastasio Auerbach MD, 12:52 PM 09/24/2014

## 2014-09-24 NOTE — Patient Instructions (Signed)
You may obtain a copy of any labs that were done today by logging onto MyChart as outlined in the instructions provided with your AVS (after visit summary). The office will not call with normal lab results but certainly if there are any significant abnormalities then we will contact you.   Health Maintenance, Female A healthy lifestyle and preventative care can promote health and wellness.  Maintain regular health, dental, and eye exams.  Eat a healthy diet. Foods like vegetables, fruits, whole grains, low-fat dairy products, and lean protein foods contain the nutrients you need without too many calories. Decrease your intake of foods high in solid fats, added sugars, and salt. Get information about a proper diet from your caregiver, if necessary.  Regular physical exercise is one of the most important things you can do for your health. Most adults should get at least 150 minutes of moderate-intensity exercise (any activity that increases your heart rate and causes you to sweat) each week. In addition, most adults need muscle-strengthening exercises on 2 or more days a week.   Maintain a healthy weight. The body mass index (BMI) is a screening tool to identify possible weight problems. It provides an estimate of body fat based on height and weight. Your caregiver can help determine your BMI, and can help you achieve or maintain a healthy weight. For adults 20 years and older:  A BMI below 18.5 is considered underweight.  A BMI of 18.5 to 24.9 is normal.  A BMI of 25 to 29.9 is considered overweight.  A BMI of 30 and above is considered obese.  Maintain normal blood lipids and cholesterol by exercising and minimizing your intake of saturated fat. Eat a balanced diet with plenty of fruits and vegetables. Blood tests for lipids and cholesterol should begin at age 61 and be repeated every 5 years. If your lipid or cholesterol levels are high, you are over 50, or you are a high risk for heart  disease, you may need your cholesterol levels checked more frequently.Ongoing high lipid and cholesterol levels should be treated with medicines if diet and exercise are not effective.  If you smoke, find out from your caregiver how to quit. If you do not use tobacco, do not start.  Lung cancer screening is recommended for adults aged 33 80 years who are at high risk for developing lung cancer because of a history of smoking. Yearly low-dose computed tomography (CT) is recommended for people who have at least a 30-pack-year history of smoking and are a current smoker or have quit within the past 15 years. A pack year of smoking is smoking an average of 1 pack of cigarettes a day for 1 year (for example: 1 pack a day for 30 years or 2 packs a day for 15 years). Yearly screening should continue until the smoker has stopped smoking for at least 15 years. Yearly screening should also be stopped for people who develop a health problem that would prevent them from having lung cancer treatment.  If you are pregnant, do not drink alcohol. If you are breastfeeding, be very cautious about drinking alcohol. If you are not pregnant and choose to drink alcohol, do not exceed 1 drink per day. One drink is considered to be 12 ounces (355 mL) of beer, 5 ounces (148 mL) of wine, or 1.5 ounces (44 mL) of liquor.  Avoid use of street drugs. Do not share needles with anyone. Ask for help if you need support or instructions about stopping  the use of drugs.  High blood pressure causes heart disease and increases the risk of stroke. Blood pressure should be checked at least every 1 to 2 years. Ongoing high blood pressure should be treated with medicines, if weight loss and exercise are not effective.  If you are 59 to 78 years old, ask your caregiver if you should take aspirin to prevent strokes.  Diabetes screening involves taking a blood sample to check your fasting blood sugar level. This should be done once every 3  years, after age 91, if you are within normal weight and without risk factors for diabetes. Testing should be considered at a younger age or be carried out more frequently if you are overweight and have at least 1 risk factor for diabetes.  Breast cancer screening is essential preventative care for women. You should practice "breast self-awareness." This means understanding the normal appearance and feel of your breasts and may include breast self-examination. Any changes detected, no matter how small, should be reported to a caregiver. Women in their 66s and 30s should have a clinical breast exam (CBE) by a caregiver as part of a regular health exam every 1 to 3 years. After age 101, women should have a CBE every year. Starting at age 100, women should consider having a mammogram (breast X-ray) every year. Women who have a family history of breast cancer should talk to their caregiver about genetic screening. Women at a high risk of breast cancer should talk to their caregiver about having an MRI and a mammogram every year.  Breast cancer gene (BRCA)-related cancer risk assessment is recommended for women who have family members with BRCA-related cancers. BRCA-related cancers include breast, ovarian, tubal, and peritoneal cancers. Having family members with these cancers may be associated with an increased risk for harmful changes (mutations) in the breast cancer genes BRCA1 and BRCA2. Results of the assessment will determine the need for genetic counseling and BRCA1 and BRCA2 testing.  The Pap test is a screening test for cervical cancer. Women should have a Pap test starting at age 57. Between ages 25 and 35, Pap tests should be repeated every 2 years. Beginning at age 37, you should have a Pap test every 3 years as long as the past 3 Pap tests have been normal. If you had a hysterectomy for a problem that was not cancer or a condition that could lead to cancer, then you no longer need Pap tests. If you are  between ages 50 and 76, and you have had normal Pap tests going back 10 years, you no longer need Pap tests. If you have had past treatment for cervical cancer or a condition that could lead to cancer, you need Pap tests and screening for cancer for at least 20 years after your treatment. If Pap tests have been discontinued, risk factors (such as a new sexual partner) need to be reassessed to determine if screening should be resumed. Some women have medical problems that increase the chance of getting cervical cancer. In these cases, your caregiver may recommend more frequent screening and Pap tests.  The human papillomavirus (HPV) test is an additional test that may be used for cervical cancer screening. The HPV test looks for the virus that can cause the cell changes on the cervix. The cells collected during the Pap test can be tested for HPV. The HPV test could be used to screen women aged 44 years and older, and should be used in women of any age  who have unclear Pap test results. After the age of 55, women should have HPV testing at the same frequency as a Pap test.  Colorectal cancer can be detected and often prevented. Most routine colorectal cancer screening begins at the age of 44 and continues through age 20. However, your caregiver may recommend screening at an earlier age if you have risk factors for colon cancer. On a yearly basis, your caregiver may provide home test kits to check for hidden blood in the stool. Use of a small camera at the end of a tube, to directly examine the colon (sigmoidoscopy or colonoscopy), can detect the earliest forms of colorectal cancer. Talk to your caregiver about this at age 86, when routine screening begins. Direct examination of the colon should be repeated every 5 to 10 years through age 13, unless early forms of pre-cancerous polyps or small growths are found.  Hepatitis C blood testing is recommended for all people born from [birth year] through 1963 and any  individual with known risks for hepatitis C.  Practice safe sex. Use condoms and avoid high-risk sexual practices to reduce the spread of sexually transmitted infections (STIs). Sexually active women aged 36 and younger should be checked for Chlamydia, which is a common sexually transmitted infection. Older women with new or multiple partners should also be tested for Chlamydia. Testing for other STIs is recommended if you are sexually active and at increased risk.  Osteoporosis is a disease in which the bones lose minerals and strength with aging. This can result in serious bone fractures. The risk of osteoporosis can be identified using a bone density scan. Women ages 20 and over and women at risk for fractures or osteoporosis should discuss screening with their caregivers. Ask your caregiver whether you should be taking a calcium supplement or vitamin D to reduce the rate of osteoporosis.  Menopause can be associated with physical symptoms and risks. Hormone replacement therapy is available to decrease symptoms and risks. You should talk to your caregiver about whether hormone replacement therapy is right for you.  Use sunscreen. Apply sunscreen liberally and repeatedly throughout the day. You should seek shade when your shadow is shorter than you. Protect yourself by wearing long sleeves, pants, a wide-brimmed hat, and sunglasses year round, whenever you are outdoors.  Notify your caregiver of new moles or changes in moles, especially if there is a change in shape or color. Also notify your caregiver if a mole is larger than the size of a pencil eraser.  Stay current with your immunizations. Document Released: 03/12/2011 Document Revised: 12/22/2012 Document Reviewed: 03/12/2011 [facility] Patient Information 2014 [employer].

## 2014-10-01 ENCOUNTER — Ambulatory Visit: Payer: Self-pay | Admitting: Internal Medicine

## 2014-10-05 ENCOUNTER — Other Ambulatory Visit: Payer: Self-pay | Admitting: Interventional Cardiology

## 2014-11-12 ENCOUNTER — Other Ambulatory Visit: Payer: Self-pay | Admitting: Interventional Cardiology

## 2014-11-15 ENCOUNTER — Other Ambulatory Visit: Payer: Self-pay | Admitting: Internal Medicine

## 2014-11-16 ENCOUNTER — Encounter: Payer: Self-pay | Admitting: Sports Medicine

## 2014-11-16 ENCOUNTER — Ambulatory Visit (INDEPENDENT_AMBULATORY_CARE_PROVIDER_SITE_OTHER): Payer: Medicare Other | Admitting: Sports Medicine

## 2014-11-16 VITALS — BP 115/80 | HR 66 | Ht 64.0 in | Wt 170.0 lb

## 2014-11-16 DIAGNOSIS — M65852 Other synovitis and tenosynovitis, left thigh: Secondary | ICD-10-CM | POA: Diagnosis not present

## 2014-11-16 DIAGNOSIS — M7632 Iliotibial band syndrome, left leg: Secondary | ICD-10-CM | POA: Diagnosis not present

## 2014-11-16 DIAGNOSIS — M76892 Other specified enthesopathies of left lower limb, excluding foot: Secondary | ICD-10-CM

## 2014-11-16 MED ORDER — PREDNISONE (PAK) 10 MG PO TABS: ORAL_TABLET | ORAL | Status: DC

## 2014-11-16 NOTE — Assessment & Plan Note (Addendum)
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

## 2014-11-16 NOTE — Assessment & Plan Note (Signed)
-  Rx 6 day Medrol Dose Pack -Hip abduction exercises -ITB stretching -f/u 4-6 weeks prn

## 2014-11-16 NOTE — Progress Notes (Signed)
   Subjective:    Patient ID: Hailey [name], female    DOB: 10-22-1934, 80 y.o.   MRN: [id number]  HPI Hailey [name] is a 79 year old female who presents with left leg pain. Onset was several weeks ago when she returned following a beach vacation. Location of pain is primarily in the left lateral hip extending down the iliotibial band to the left lateral calf. She denies any low back pain, numbness, tingling, or weakness. Symptoms are aggravated with pressing on her lateral upper thigh and lateral lower leg. She denies any swelling, warmth, or induration. She recalls that she is walking more on her trip on uneven ground such as the beach. She has a history of greater trochanteric bursitis and gluteus medius tendinopathy, but she says this pain is somewhat more diffuse. She also has history of left hip flexor pain. Her symptoms are aggravated if she presses on the left lateral thigh and left lateral lower leg. She denies any shortness of breath. She denies any history of injury. She has tried taking over-the-counter anti-inflammatories with little relief. Her pain will wake her up at night if she lays on her left side.  Past medical history, social history, medications, and allergies were reviewed and are up to date in the chart.  Review of Systems 7 point review of systems was performed and was otherwise negative unless noted in the history of present illness.     Objective:   Physical Exam BP 115/80 mmHg  Pulse 66  Ht 5\' 4"  (1.626 m)  Wt 170 lb (77.111 kg)  BMI 29.17 kg/m2 GEN: The patient is well-developed well-nourished female and in no acute distress.  She is awake alert and oriented x3. SKIN: warm and well-perfused, no rash  EXTR: No lower extremity edema or calf tenderness Neuro: Strength 5/5 globally. Sensation intact throughout. DTRs 2/4 bilaterally. No focal deficits. Vasc: +2 bilateral distal pulses. No edema.  MSK: Examination of the lower extremities reveals full internal and  external range of motion without pain. She has tenderness to palpation over an area just inferior and lateral to the ASIS over the tensor fascia lata. She also has tenderness palpating along the iliotibial band and left lateral lower calf. Negative Squeeze test. No swelling or ropey cord. Chest is weak hip abductors. Increased valgus alignment the knees. Minimal tenderness over the greater trochanteric bursa.     Assessment & Plan:  .Past medical history, social history, medications, and allergies were reviewed and are up to date in the chart.

## 2014-12-15 DIAGNOSIS — R1032 Left lower quadrant pain: Secondary | ICD-10-CM | POA: Diagnosis not present

## 2014-12-20 ENCOUNTER — Ambulatory Visit (INDEPENDENT_AMBULATORY_CARE_PROVIDER_SITE_OTHER): Payer: Medicare Other | Admitting: Interventional Cardiology

## 2014-12-20 ENCOUNTER — Encounter: Payer: Self-pay | Admitting: Interventional Cardiology

## 2014-12-20 VITALS — BP 110/62 | HR 57 | Ht 64.0 in | Wt 173.4 lb

## 2014-12-20 DIAGNOSIS — I480 Paroxysmal atrial fibrillation: Secondary | ICD-10-CM | POA: Diagnosis not present

## 2014-12-20 DIAGNOSIS — R0789 Other chest pain: Secondary | ICD-10-CM | POA: Diagnosis not present

## 2014-12-20 DIAGNOSIS — I10 Essential (primary) hypertension: Secondary | ICD-10-CM

## 2014-12-20 DIAGNOSIS — I2510 Atherosclerotic heart disease of native coronary artery without angina pectoris: Secondary | ICD-10-CM | POA: Diagnosis not present

## 2014-12-20 DIAGNOSIS — I25119 Atherosclerotic heart disease of native coronary artery with unspecified angina pectoris: Secondary | ICD-10-CM | POA: Insufficient documentation

## 2014-12-20 MED ORDER — DILTIAZEM HCL ER COATED BEADS 180 MG PO CP24: 180.0000 mg | ORAL_CAPSULE | Freq: Every day | ORAL | Status: DC

## 2014-12-20 MED ORDER — PRAVASTATIN SODIUM 20 MG PO TABS: 20.0000 mg | ORAL_TABLET | ORAL | Status: DC

## 2014-12-20 NOTE — Progress Notes (Signed)
Cardiology Office Note   Date:  12/20/2014   ID:  Hailey [name], DOB Nov 04, 1934, MRN [id number]  PCP:  Walker Kehr, MD  Cardiologist:   Sinclair Grooms, MD   No chief complaint on file.     History of Present Illness: Hailey [name] is a 80 y.o. female who presents for paroxysmal atrial fibrillation, chronic anticoagulation for stroke prevention, and hypertension. In the interim the diagnosis of coronary disease is been made after a CT scan of the chest revealed three-vessel coronary calcification.  On the current medical regimen, she has fatigue and has noted some hair loss. She has had no significant episodes of atrial fibrillation. She did have a prolonged episode of chest discomfort in October 2015 and evaluation at that time demonstrated among other things, three-vessel coronary calcification. There were no episodes of atrial fibrillation during the hospital eval. She has had no significant episodes that have required attention. Chest discomfort has not recurred. She did try pravastatin 20 mg 3 times per week but had GI upset. She stopped this medication with improvement.    Past Medical History  Diagnosis Date  . History of colon polyps   . GERD (gastroesophageal reflux disease)   . HTN (hypertension)   . SVT (supraventricular tachycardia)     brief history  . PAC (premature atrial contraction)     Symptomatiic  . Anxiety   . Allergic rhinitis   . IBS (irritable bowel syndrome)     constipation predominant - Dr Earlean Shawl  . Diverticulosis of colon   . Hip bursitis 2010    Dr Para March, Post op seroma  . Osteopenia 12/2011    T score -1.3 FRAX 10%/1.9%  . Atrial fibrillation   . Lichen sclerosus     Past Surgical History  Procedure Laterality Date  . Bladder surgery    . Breast reduction surgery    . Appendectomy    . Troch bursa resection  2010  . Vaginal hysterectomy      BSO  . Oophorectomy      BSO  . Toe surgery       Current Outpatient  Prescriptions  Medication Sig Dispense Refill  . cetirizine (ZYRTEC) 10 MG tablet Take 10 mg by mouth daily as needed.     . DULoxetine (CYMBALTA) 60 MG capsule TAKE (1) CAPSULE DAILY. 90 capsule 1  . ELIQUIS 5 MG TABS tablet TAKE 1 TABLET TWICE DAILY. 60 tablet 1  . esomeprazole (NEXIUM) 40 MG capsule Take 40 mg by mouth daily before breakfast.      . estradiol (CLIMARA - DOSED IN MG/24 HR) 0.0375 mg/24hr patch APPLY 1 PATCH ONCE WEEKLY AS DIRECTED. 4 patch 12  . LORazepam (ATIVAN) 1 MG tablet Take 1 mg by mouth 2 (two) times daily as needed for anxiety or sleep.    . metoprolol succinate (TOPROL-XL) 25 MG 24 hr tablet TAKE 1 & 1/2 TABLETS TWICE DAILY. 270 tablet 1  . diltiazem (CARDIZEM CD) 180 MG 24 hr capsule Take 1 capsule (180 mg total) by mouth daily. 30 capsule 5  . pravastatin (PRAVACHOL) 20 MG tablet Take 1 tablet (20 mg total) by mouth every Monday, Wednesday, and Friday.     No current facility-administered medications for this visit.    Allergies:   Meloxicam and Sulfamethoxazole-trimethoprim    Social History:  The patient  reports that she quit smoking about 39 years ago. She has never used smokeless tobacco. She reports that she drinks about 3.6  oz of alcohol per week. She reports that she does not use illicit drugs.   Family History:  The patient's family history includes Cancer in her brother and father; Colon cancer in her mother; Diabetes in her father. There is no history of Heart attack or Stroke.    ROS:  Please see the history of present illness.   Otherwise, review of systems are positive for gastrointestinal upset all on pravastatin, fatigue, hair loss, and weakness. Resume bruising. Some difficulty sleeping..   All other systems are reviewed and negative.    PHYSICAL EXAM: VS:  BP 110/62 mmHg  Pulse 57  Ht 5\' 4"  (1.626 m)  Wt 173 lb 6.4 oz (78.654 kg)  BMI 29.75 kg/m2 , BMI Body mass index is 29.75 kg/(m^2). GEN: Well nourished, well developed, in no acute  distress HEENT: normal Neck: no JVD, carotid bruits, or masses Cardiac: RRR; no murmurs, rubs, or gallops,no edema  Respiratory:  clear to auscultation bilaterally, normal work of breathing GI: soft, nontender, nondistended, + BS MS: no deformity or atrophy Skin: warm and dry, no rash Neuro:  Strength and sensation are intact Psych: euthymic mood, full affect   EKG:  EKG is not ordered today.    Recent Labs: 01/25/2014: TSH 1.37 08/30/2014: ALT 13; BUN 26*; Creatinine 1.2; Hemoglobin 12.0; Platelets 274.0; Potassium 4.2; Sodium 134*    Lipid Panel    Component Value Date/Time   CHOL 153 07/04/2014 0528   TRIG 97 07/04/2014 0528   TRIG 183* 07/26/2006 0903   HDL 69 07/04/2014 0528   CHOLHDL 2.2 07/04/2014 0528   CHOLHDL 2.8 CALC 07/26/2006 0903   VLDL 19 07/04/2014 0528   LDLCALC 65 07/04/2014 0528   LDLDIRECT 89.1 06/29/2013 1604      Wt Readings from Last 3 Encounters:  12/20/14 173 lb 6.4 oz (78.654 kg)  11/16/14 170 lb (77.111 kg)  09/24/14 173 lb (78.472 kg)      Other studies Reviewed: Additional studies/ records that were reviewed today include: October hospital. Review of the above records demonstrates: Three-vessel coronary calcification   ASSESSMENT AND PLAN:  PAF (paroxysmal atrial fibrillation): No prolonged episodes  Essential hypertension: Controlled  Other chest pain: No recurrence sent October. Three-vessel coronary calcification by CT.  Beta blocker therapy causing fatigue and hair loss     Current medicines are reviewed at length with the patient today.  The patient has concerns regarding medicines.  The following changes have been made:  Wean and discontinue metoprolol succinate. Start diltiazem CD 180 mg per day after metoprolol has been discontinued.  Labs/ tests ordered today include:  No orders of the defined types were placed in this encounter.     Disposition:   FU with Linard Millers in 2 months   Signed, Sinclair Grooms,  MD  12/20/2014 2:44 PM    [address], [place], [place]  [address] Phone: [phone]; Fax: [phone]

## 2014-12-20 NOTE — Patient Instructions (Addendum)
Your physician has recommended you make the following change in your medication:  1) REDUCE Metoprolol to 25mg  twice daily for 1 week, THEN take 12.5mg  twice daily for 1 week then STOP  2)THEN START Diltiazem CD 180mg  daily. An Rx has been sent to your pharmacy. 3) RESTART Pravastatin 20mg  every Mon,Wed,Fri  Your physician recommends that you schedule a follow-up appointment in: 3 months with Dr.Smith

## 2014-12-23 ENCOUNTER — Ambulatory Visit (INDEPENDENT_AMBULATORY_CARE_PROVIDER_SITE_OTHER): Payer: Medicare Other | Admitting: Sports Medicine

## 2014-12-23 ENCOUNTER — Encounter: Payer: Self-pay | Admitting: Sports Medicine

## 2014-12-23 ENCOUNTER — Ambulatory Visit
Admission: RE | Admit: 2014-12-23 | Discharge: 2014-12-23 | Disposition: A | Discharge status: Home / Self Care | Payer: Medicare Other | Source: Ambulatory Visit | Attending: Sports Medicine | Admitting: Sports Medicine

## 2014-12-23 VITALS — BP 119/100 | HR 57 | Ht 64.0 in | Wt 173.0 lb

## 2014-12-23 DIAGNOSIS — M76892 Other specified enthesopathies of left lower limb, excluding foot: Secondary | ICD-10-CM

## 2014-12-23 DIAGNOSIS — M7062 Trochanteric bursitis, left hip: Secondary | ICD-10-CM

## 2014-12-23 DIAGNOSIS — M1612 Unilateral primary osteoarthritis, left hip: Secondary | ICD-10-CM

## 2014-12-23 DIAGNOSIS — M5137 Other intervertebral disc degeneration, lumbosacral region: Secondary | ICD-10-CM

## 2014-12-23 DIAGNOSIS — M7061 Trochanteric bursitis, right hip: Secondary | ICD-10-CM

## 2014-12-23 DIAGNOSIS — M160 Bilateral primary osteoarthritis of hip: Secondary | ICD-10-CM | POA: Diagnosis not present

## 2014-12-23 MED ORDER — TRAMADOL HCL 50 MG PO TABS: 50.0000 mg | ORAL_TABLET | Freq: Four times a day (QID) | ORAL | Status: DC | PRN

## 2014-12-23 NOTE — Patient Instructions (Signed)
Let's try doing a low dose of tramadol twice a day Dose is 50 Mg twice daily  Keep up some easy stretches and hip motion exercises on left  Try this regimen for 4 to 6 weeks then we should reject  If not working I will scan and consider injection  We will do an Xray to see if there is a spur or something causing this

## 2014-12-23 NOTE — Assessment & Plan Note (Signed)
This may increase her risk of hip pain and sxs  No radicular signs noted now

## 2014-12-23 NOTE — Progress Notes (Signed)
Patient ID: Hailey [name], female   DOB: Oct 15, 1934, 80 y.o.   MRN: [id number]  Patient with a new flare of left hip pain Just returned from [place] Feels that deep sand may have caused some irritaiton with her walking  She had sone pretty well for past year plus but I have seen her for left hip over past 2 years including GT bursitis  Saw Dr Nolene Ebbs -jacobs Given prednisone Tx and much improved  Pain starting to come back over lateral and ant superior hip area/ less over groin  Exam NAD BP 119/100 mmHg  Pulse 57  Ht 5\' 4"  (1.626 m)  Wt 173 lb (78.472 kg)  BMI 29.68 kg/m2  Left Hip IR is 35 deg but causes pain ER 30 deg - less pain Hip flexion with mild pain on left not RT Strength is good TTP over left GT TTP over ASIS  XRays- mild to moderate DJD Some lumbar spine DJD and DDD

## 2014-12-23 NOTE — Assessment & Plan Note (Signed)
I think she needs to start some chronic pain med [place] to control OA sxs  Working with personal trainer and strength and motion are good  Use tramadol bid Cont tylenol  Cont strethes and exercise program  Reck 6 wks

## 2015-01-10 ENCOUNTER — Telehealth: Payer: Self-pay | Admitting: Interventional Cardiology

## 2015-01-10 NOTE — Telephone Encounter (Signed)
New Message   Pt c/o swelling: STAT is pt has developed SOB within 24 hours  1. How long have you been experiencing swelling? Since thurs 4/28  2. Where is the swelling located? Ankles, feet, and partial legs- coolness in ankle  3.  Are you currently taking a "fluid pill"? no  4.  Are you currently SOB? no  5.  Have you traveled recently? [place]

## 2015-01-10 NOTE — Telephone Encounter (Signed)
Returned pt call. Pt sts that she has had some LE extremity swelling for the last 3-4 days. She denies any other symptoms including SOB.she went on a trip to [place] over the weekend, she reports that she ate out for most meals. Since there is still having LE swelling. She wore compression stocking all day yesterday and had improvement. She reports that swelling was better this morning, buts since she has been up on her feet swelling has returned.  Adv pt to wear her compression stockings daily. Elevate her feet as much as she can. Monitor her sodium intake. Adv her I will fwd an update to Dr.Smith and call back if he has ant additional recommendations. Pt agreeable with plan and verbalized understanding.

## 2015-01-11 NOTE — Telephone Encounter (Signed)
F/u    Pt waiting on a call back from previous message.

## 2015-01-11 NOTE — Telephone Encounter (Signed)
Needs to get an EKG to rule out atrial fib

## 2015-01-11 NOTE — Telephone Encounter (Signed)
Disregard Dr.Smith's prior recommendations. Pt denies chest pain, sob, palpitations. She reports that her LE swelling has improved. The swelling is bilateral. Dr.Smith recommendations are for pt to continue to wear her compression stockings daily, She should elevate her feet as much as possible, She should monitor her sodium intake. Adv her that swelling should continue to improve and resolve. Pt verbalized understanding.

## 2015-01-13 ENCOUNTER — Other Ambulatory Visit: Payer: Self-pay | Admitting: Interventional Cardiology

## 2015-01-19 ENCOUNTER — Telehealth: Payer: Self-pay | Admitting: Interventional Cardiology

## 2015-01-19 NOTE — Telephone Encounter (Signed)
New message      Pt c/o swelling: STAT is pt has developed SOB within 24 hours  1. How long have you been experiencing swelling?  Since last week----off and on 2. Where is the swelling located?  Both feet and ankles  3.  Are you currently taking a "fluid pill"? no  4.  Are you currently SOB? no 5.  Have you traveled recently? Long car trip 2 weeks ago

## 2015-01-19 NOTE — Telephone Encounter (Signed)
Returned pt call. Pt sts that she has been having intermittent LE swelling. Swelling occurs later in that day. Pt complains of bilateral swelling in her ankles and feet.  She has not been wearing her compression stockings daily. She does elevate her feet as much as possible. She denies chest pain, sob, fever, chills, cough. She reports intermittent palpitations, racing  Heart that is usually short in duration.  Adv pt to wear her compression daily, elevate her legs as much as possible.  Dr.Smith updated, and wants pt to come in for an EKG  Pt agreeable, pt aware of Nurse visit EKG scheduled for 5/13 @ 9am, pt to call the office if other symptoms develop.

## 2015-01-21 ENCOUNTER — Ambulatory Visit (INDEPENDENT_AMBULATORY_CARE_PROVIDER_SITE_OTHER): Payer: Medicare Other | Admitting: *Deleted

## 2015-01-21 ENCOUNTER — Encounter: Payer: Self-pay | Admitting: *Deleted

## 2015-01-21 VITALS — BP 159/90 | HR 71 | Ht 64.0 in | Wt 173.8 lb

## 2015-01-21 DIAGNOSIS — I480 Paroxysmal atrial fibrillation: Secondary | ICD-10-CM

## 2015-01-21 DIAGNOSIS — I4819 Other persistent atrial fibrillation: Secondary | ICD-10-CM

## 2015-01-21 DIAGNOSIS — I10 Essential (primary) hypertension: Secondary | ICD-10-CM

## 2015-01-21 DIAGNOSIS — Z79899 Other long term (current) drug therapy: Secondary | ICD-10-CM | POA: Diagnosis not present

## 2015-01-21 DIAGNOSIS — I481 Persistent atrial fibrillation: Secondary | ICD-10-CM | POA: Diagnosis not present

## 2015-01-21 DIAGNOSIS — R609 Edema, unspecified: Secondary | ICD-10-CM | POA: Diagnosis not present

## 2015-01-21 MED ORDER — FUROSEMIDE 20 MG PO TABS: 20.0000 mg | ORAL_TABLET | Freq: Every day | ORAL | Status: DC

## 2015-01-21 NOTE — Progress Notes (Signed)
Diuretic added and BMET is pending. Needs OV with me within next 2-4 weeks.

## 2015-01-21 NOTE — Progress Notes (Signed)
1.) Reason for visit: evaluate increased LE edema.  Edema started about 2 weeks ago while in [place].  Had been walking around a lot.  Thought it was coming from that.  Once back it has been off and on esp after sitting for long periods.  She is keeping her feet elevated when possible.  Doesn't eat fast food, some processed foods, only adds salt to egg or french fries but doesn't have these very often.  2.) Name of MD requesting visit: Dr Tamala Julian  3.) H&P: HTN today - "bottom number has been high"   4.) ROS related to problem: edema at feet and ankles.  Not pitting.  Reports redness on occasion at lower shin area.  5.) Assessment and plan per MD: Information and EKG reviewed with Dr Mare Ferrari who gives orders to start Furosemide 20 mg a day and have BMP in 1 week.  Advised pt of orders and reviewed rational.  She states understanding and she agreeable.  RX will be sent into [facility].  She is flying to [place] next week and will come in for lab work when she returns 02/02/15.    She would like to know when she needs to see Dr Tamala Julian back in follow up.  He had said in July however she didn't make an appt because she usually goes to the beach in June and July.

## 2015-01-21 NOTE — Patient Instructions (Signed)
Medication Instructions:  Please start Furosemide 20 mg once a day. Continue all other medications as listed.  Labwork: Have blood work 02/02/15 when you return from your trip.  Follow-Up: Further follow up will be determined by Dr Tamala Julian.  Thank you for choosing [facility]!!

## 2015-01-24 NOTE — Progress Notes (Signed)
Make ov for follow-up before mid June

## 2015-01-25 NOTE — Telephone Encounter (Signed)
-----   Message from Shellia Cleverly, RN sent at 01/24/2015 10:01 AM EDT -----   ----- Message -----    From: Belva Crome, MD    Sent: 01/21/2015   4:26 PM      To: Shellia Cleverly, RN  Needs appointment in June. ----- Message -----    From: Shellia Cleverly, RN    Sent: 01/21/2015   9:54 AM      To: Belva Crome, MD

## 2015-01-25 NOTE — Telephone Encounter (Signed)
Pt aware of appt with Dr.Smith for 6/10 @ 2pm. Pt reports improvement in LE swelling after starting Lasix. She denies sob, chest pain, palpitations She wears her compression stocking when leaving the house and that does help with swelling. Adv when at home she should still try to elevate her legs as much as she can. She will keep her lab appt for 5/25.

## 2015-02-02 ENCOUNTER — Other Ambulatory Visit (INDEPENDENT_AMBULATORY_CARE_PROVIDER_SITE_OTHER): Payer: Medicare Other | Admitting: *Deleted

## 2015-02-02 ENCOUNTER — Other Ambulatory Visit: Payer: Self-pay | Admitting: Gynecology

## 2015-02-02 DIAGNOSIS — R609 Edema, unspecified: Secondary | ICD-10-CM

## 2015-02-02 DIAGNOSIS — Z79899 Other long term (current) drug therapy: Secondary | ICD-10-CM | POA: Diagnosis not present

## 2015-02-02 DIAGNOSIS — I10 Essential (primary) hypertension: Secondary | ICD-10-CM | POA: Diagnosis not present

## 2015-02-02 LAB — BASIC METABOLIC PANEL
BUN: 19 mg/dL (ref 6–23)
CO2: 28 mEq/L (ref 19–32)
Calcium: 9.2 mg/dL (ref 8.4–10.5)
Chloride: 102 mEq/L (ref 96–112)
Creatinine, Ser: 1.27 mg/dL — ABNORMAL HIGH (ref 0.40–1.20)
GFR: 43.23 mL/min — ABNORMAL LOW (ref >60.00)
Glucose, Bld: 95 mg/dL (ref 70–99)
Potassium: 3.9 mEq/L (ref 3.5–5.1)
Sodium: 136 mEq/L (ref 135–145)

## 2015-02-03 ENCOUNTER — Encounter: Payer: Self-pay | Admitting: Internal Medicine

## 2015-02-03 ENCOUNTER — Ambulatory Visit (INDEPENDENT_AMBULATORY_CARE_PROVIDER_SITE_OTHER): Payer: Medicare Other | Admitting: Internal Medicine

## 2015-02-03 VITALS — BP 120/88 | HR 83 | Wt 172.0 lb

## 2015-02-03 DIAGNOSIS — I2510 Atherosclerotic heart disease of native coronary artery without angina pectoris: Secondary | ICD-10-CM

## 2015-02-03 DIAGNOSIS — R197 Diarrhea, unspecified: Secondary | ICD-10-CM | POA: Diagnosis not present

## 2015-02-03 DIAGNOSIS — F411 Generalized anxiety disorder: Secondary | ICD-10-CM | POA: Diagnosis not present

## 2015-02-03 DIAGNOSIS — I10 Essential (primary) hypertension: Secondary | ICD-10-CM

## 2015-02-03 NOTE — Assessment & Plan Note (Signed)
No relapse 

## 2015-02-03 NOTE — Progress Notes (Signed)
   Subjective:   HPI   F/u diverticulitis (10/15 in [place]) - no relapse  The patient needs to address  chronic hypertension that has been well controlled with medicine  F/u bruising, A. fib - better F/u elev. BP  F/u R hip pain.  Review of Systems  Constitutional: Negative for appetite change and fatigue.  HENT: Negative for tinnitus.   Eyes: Negative for pain.  Respiratory: Negative for chest tightness and shortness of breath.   Cardiovascular: Negative for chest pain.  Musculoskeletal: Negative for back pain and neck pain.  Neurological: Negative for dizziness, speech difficulty and weakness.  Hematological: Negative for adenopathy. Bruises/bleeds easily.  Psychiatric/Behavioral: Negative for behavioral problems and dysphoric mood.   Wt Readings from Last 3 Encounters:  02/03/15 172 lb (78.019 kg)  01/21/15 173 lb 12 oz (78.812 kg)  12/23/14 173 lb (78.472 kg)       BP Readings from Last 3 Encounters:  02/03/15 120/88  01/21/15 159/90  12/23/14 119/100    Objective:   Physical Exam  Constitutional: She appears well-developed. No distress.  HENT:  Head: Normocephalic.  Right Ear: External ear normal.  Left Ear: External ear normal.  Nose: Nose normal.  Mouth/Throat: Oropharynx is clear and moist.  Eyes: Conjunctivae are normal. Pupils are equal, round, and reactive to light. Right eye exhibits no discharge. Left eye exhibits no discharge.  Neck: Normal range of motion. Neck supple. No JVD present. No tracheal deviation present. No thyromegaly present.  Cardiovascular: Normal rate, regular rhythm and normal heart sounds.   Pulmonary/Chest: No stridor. No respiratory distress. She has no wheezes.  Abdominal: Soft. Bowel sounds are normal. She exhibits no distension and no mass. There is no tenderness. There is no rebound and no guarding.  Musculoskeletal: She exhibits no edema or tenderness.  Lymphadenopathy:    She has no cervical adenopathy.  Neurological: She  displays normal reflexes. No cranial nerve deficit. She exhibits normal muscle tone. Coordination normal.  Skin: No rash noted. No erythema.  Psychiatric: She has a normal mood and affect. Her behavior is normal. Judgment and thought content normal.       Lab Results  Component Value Date   WBC 7.2 08/30/2014   HGB 12.0 08/30/2014   HCT 37.1 08/30/2014   PLT 274.0 08/30/2014   GLUCOSE 95 02/02/2015   CHOL 153 07/04/2014   TRIG 97 07/04/2014   HDL 69 07/04/2014   LDLDIRECT 89.1 06/29/2013   LDLCALC 65 07/04/2014   ALT 13 08/30/2014   AST 25 08/30/2014   NA 136 02/02/2015   K 3.9 02/02/2015   CL 102 02/02/2015   CREATININE 1.27* 02/02/2015   BUN 19 02/02/2015   CO2 28 02/02/2015   TSH 1.37 01/25/2014   INR 1.27 07/04/2014   HGBA1C 5.6 07/04/2014       Assessment & Plan:

## 2015-02-03 NOTE — Assessment & Plan Note (Signed)
Lorazepam prn at hs

## 2015-02-03 NOTE — Assessment & Plan Note (Signed)
Cardizem  

## 2015-02-03 NOTE — Progress Notes (Signed)
Pre visit review using our clinic review tool, if applicable. No additional management support is needed unless otherwise documented below in the visit note. 

## 2015-02-03 NOTE — Assessment & Plan Note (Signed)
On Pravastatin, Eliquis

## 2015-02-18 ENCOUNTER — Encounter: Payer: Self-pay | Admitting: Interventional Cardiology

## 2015-02-18 ENCOUNTER — Ambulatory Visit (INDEPENDENT_AMBULATORY_CARE_PROVIDER_SITE_OTHER): Payer: Medicare Other | Admitting: Interventional Cardiology

## 2015-02-18 VITALS — BP 130/86 | HR 56 | Ht 64.5 in | Wt 173.2 lb

## 2015-02-18 DIAGNOSIS — R55 Syncope and collapse: Secondary | ICD-10-CM | POA: Diagnosis not present

## 2015-02-18 DIAGNOSIS — Z889 Allergy status to unspecified drugs, medicaments and biological substances status: Secondary | ICD-10-CM

## 2015-02-18 DIAGNOSIS — R609 Edema, unspecified: Secondary | ICD-10-CM

## 2015-02-18 DIAGNOSIS — I2510 Atherosclerotic heart disease of native coronary artery without angina pectoris: Secondary | ICD-10-CM

## 2015-02-18 DIAGNOSIS — Z789 Other specified health status: Secondary | ICD-10-CM | POA: Insufficient documentation

## 2015-02-18 DIAGNOSIS — I482 Chronic atrial fibrillation, unspecified: Secondary | ICD-10-CM

## 2015-02-18 MED ORDER — DIGOXIN 125 MCG PO TABS: 0.1250 mg | ORAL_TABLET | Freq: Every day | ORAL | Status: DC

## 2015-02-18 NOTE — Progress Notes (Signed)
Cardiology Office Note   Date:  02/18/2015   ID:  Hailey [name], DOB May 29, 1935, MRN [id number]  PCP:  Walker Kehr, MD  Cardiologist:  Sinclair Grooms, MD   Chief Complaint  Patient presents with  . CORONARY ARTERY DISEASE OF NATIVE CORONARY ARTERY      History of Present Illness: Hailey [name] is a 80 y.o. female who presents for continuous atrial fibrillation, lower extremity swelling, hypertension, and chronic anticoagulation therapy.  Hailey [name] is here today because we are following up lower extremity swelling. She feels that the swelling started after metoprolol was switched to diltiazem. We switch from beta blocker therapy because she felt she was having hair loss.    Past Medical History  Diagnosis Date  . History of colon polyps   . GERD (gastroesophageal reflux disease)   . HTN (hypertension)   . SVT (supraventricular tachycardia)     brief history  . PAC (premature atrial contraction)     Symptomatiic  . Anxiety   . Allergic rhinitis   . IBS (irritable bowel syndrome)     constipation predominant - Dr Earlean Shawl  . Diverticulosis of colon   . Hip bursitis 2010    Dr Para March, Post op seroma  . Osteopenia 12/2011    T score -1.3 FRAX 10%/1.9%  . Atrial fibrillation   . Lichen sclerosus     Past Surgical History  Procedure Laterality Date  . Bladder surgery    . Breast reduction surgery    . Appendectomy    . Troch bursa resection  2010  . Vaginal hysterectomy      BSO  . Oophorectomy      BSO  . Toe surgery       Current Outpatient Prescriptions  Medication Sig Dispense Refill  . cetirizine (ZYRTEC) 10 MG tablet Take 10 mg by mouth daily as needed.     . clobetasol cream (TEMOVATE) 0.05 % APPLY NIGHTLY AS DIRECTED. 15 g 1  . diltiazem (CARDIZEM CD) 180 MG 24 hr capsule Take 1 capsule (180 mg total) by mouth daily. 30 capsule 5  . DULoxetine (CYMBALTA) 60 MG capsule TAKE (1) CAPSULE DAILY. 90 capsule 1  . ELIQUIS 5 MG TABS tablet TAKE 1  TABLET TWICE DAILY. 60 tablet 3  . esomeprazole (NEXIUM) 40 MG capsule Take 40 mg by mouth daily before breakfast.      . estradiol (CLIMARA - DOSED IN MG/24 HR) 0.0375 mg/24hr patch APPLY 1 PATCH ONCE WEEKLY AS DIRECTED. 4 patch 12  . furosemide (LASIX) 20 MG tablet Take 1 tablet (20 mg total) by mouth daily. 30 tablet 3  . LORazepam (ATIVAN) 1 MG tablet Take 1 mg by mouth 2 (two) times daily as needed for anxiety or sleep.    . pravastatin (PRAVACHOL) 20 MG tablet Take 1 tablet (20 mg total) by mouth every Monday, Wednesday, and Friday.     No current facility-administered medications for this visit.    Allergies:   Meloxicam; Sulfamethoxazole-trimethoprim; and Tramadol    Social History:  The patient  reports that she quit smoking about 39 years ago. She has never used smokeless tobacco. She reports that she drinks about 3.6 oz of alcohol per week. She reports that she does not use illicit drugs.   Family History:  The patient's family history includes Cancer in her brother and father; Colon cancer in her mother; Diabetes in her father. There is no history of Heart attack or Stroke.  ROS:  Please see the history of present illness.   Otherwise, review of systems are positive for none.   All other systems are reviewed and negative.    PHYSICAL EXAM: VS:  BP 130/86 mmHg  Pulse 56  Ht 5' 4.5" (1.638 m)  Wt 78.563 kg (173 lb 3.2 oz)  BMI 29.28 kg/m2  SpO2 99% , BMI Body mass index is 29.28 kg/(m^2). GEN: Well nourished, well developed, in no acute distress HEENT: normal Neck: no JVD, carotid bruits, or masses Cardiac: IIRR; no murmurs, rubs, or gallops,no edema  Respiratory:  clear to auscultation bilaterally, normal work of breathing GI: soft, nontender, nondistended, + BS MS: no deformity or atrophy Skin: warm and dry, no rash Neuro:  Strength and sensation are intact Psych: euthymic mood, full affect   EKG:  EKG is ordered today. The ekg ordered today demonstrates atrial  fibrillation with controlled rate. QS pattern V1 through V3   Recent Labs: 08/30/2014: ALT 13; Hemoglobin 12.0; Platelets 274.0 02/02/2015: BUN 19; Creatinine, Ser 1.27*; Potassium 3.9; Sodium 136    Lipid Panel    Component Value Date/Time   CHOL 153 07/04/2014 0528   TRIG 97 07/04/2014 0528   TRIG 183* 07/26/2006 0903   HDL 69 07/04/2014 0528   CHOLHDL 2.2 07/04/2014 0528   CHOLHDL 2.8 CALC 07/26/2006 0903   VLDL 19 07/04/2014 0528   LDLCALC 65 07/04/2014 0528   LDLDIRECT 89.1 06/29/2013 1604      Wt Readings from Last 3 Encounters:  02/18/15 78.563 kg (173 lb 3.2 oz)  02/03/15 78.019 kg (172 lb)  01/21/15 78.812 kg (173 lb 12 oz)      Other studies Reviewed: Additional studies/ records that were reviewed today include: . Review of the above records demonstrates:    ASSESSMENT AND PLAN:  Chronic atrial fibrillation - because of edema possibly related to diltiazem, we will discontinue that medication and start digoxin 0.125 mg per day. We will discontinue furosemide.  Coronary artery disease involving native coronary artery of native heart without angina pectoris  Factor XI deficiency  Lower extremity edema, possibly related to diltiazem. Later treated with furosemide.     Current medicines are reviewed at length with the patient today.  The patient does not have concerns regarding medicines.  The following changes have been made:  Discontinue diltiazem. Start digoxin 0.125 mg daily. Discontinue furosemide.  Labs/ tests ordered today include:  No orders of the defined types were placed in this encounter.     Disposition:   FU with HS in 3 months  Signed, Sinclair Grooms, MD  02/18/2015 2:18 PM    [facility] [address], [place], [place]  [address] Phone: [phone]; Fax: [phone]

## 2015-02-18 NOTE — Patient Instructions (Addendum)
Medication Instructions:  Your physician has recommended you make the following change in your medication:  1) STOP Diltiazem 2) START Digoxin 0.125mg  daily. An Rx has been sent to your pharmacy 3) STOP Lasix on 02/19/15  Labwork: None   Testing/Procedures: None   Follow-Up: You have a follow up on 914/16 @ 9am  Any Other Special Instructions Will Be Listed Below (If Applicable). Call the office if symptoms fail to improve or worsen

## 2015-03-13 DIAGNOSIS — M79662 Pain in left lower leg: Secondary | ICD-10-CM | POA: Diagnosis not present

## 2015-03-13 DIAGNOSIS — S8011XA Contusion of right lower leg, initial encounter: Secondary | ICD-10-CM | POA: Diagnosis not present

## 2015-03-13 DIAGNOSIS — M7989 Other specified soft tissue disorders: Secondary | ICD-10-CM | POA: Diagnosis not present

## 2015-03-13 DIAGNOSIS — M79606 Pain in leg, unspecified: Secondary | ICD-10-CM | POA: Diagnosis not present

## 2015-03-13 DIAGNOSIS — T1490 Injury, unspecified: Secondary | ICD-10-CM | POA: Diagnosis not present

## 2015-03-15 DIAGNOSIS — S8011XD Contusion of right lower leg, subsequent encounter: Secondary | ICD-10-CM | POA: Diagnosis not present

## 2015-03-15 DIAGNOSIS — M79604 Pain in right leg: Secondary | ICD-10-CM | POA: Diagnosis not present

## 2015-03-17 ENCOUNTER — Telehealth: Payer: Self-pay | Admitting: Internal Medicine

## 2015-03-17 NOTE — Telephone Encounter (Signed)
Where is the swelling? Could it be a sprain? pls go to [facility] tomorrow to get checked Thx

## 2015-03-17 NOTE — Telephone Encounter (Signed)
Patient is at her beach house and she fell on the beach and hurt her leg. She had the leg xrayed out there but her leg is really swollen and hurts pretty bad. She wanted to see what Dr. Alain Marion thinks about it. Can you please call her at [phone]

## 2015-03-18 DIAGNOSIS — S86911S Strain of unspecified muscle(s) and tendon(s) at lower leg level, right leg, sequela: Secondary | ICD-10-CM | POA: Diagnosis not present

## 2015-03-18 NOTE — Telephone Encounter (Signed)
I called pt- She states when she was evaluated and was given Hydroco/APAP.She states the xray didn't show much. They advised her it was a hematoma.  I advised her of PCP's advisement below.  Pt agreed, thanked me and ended the call.

## 2015-04-07 ENCOUNTER — Encounter: Payer: Self-pay | Admitting: Sports Medicine

## 2015-04-11 ENCOUNTER — Ambulatory Visit (INDEPENDENT_AMBULATORY_CARE_PROVIDER_SITE_OTHER): Payer: Medicare Other | Admitting: Sports Medicine

## 2015-04-11 ENCOUNTER — Encounter: Payer: Self-pay | Admitting: Sports Medicine

## 2015-04-11 VITALS — BP 137/83 | Ht 64.0 in | Wt 170.0 lb

## 2015-04-11 DIAGNOSIS — M25561 Pain in right knee: Secondary | ICD-10-CM

## 2015-04-11 DIAGNOSIS — T148XXA Other injury of unspecified body region, initial encounter: Secondary | ICD-10-CM | POA: Insufficient documentation

## 2015-04-11 DIAGNOSIS — T148 Other injury of unspecified body region: Secondary | ICD-10-CM

## 2015-04-11 MED ORDER — DICLOFENAC SODIUM 1 % TD GEL: 2.0000 g | Freq: Four times a day (QID) | TRANSDERMAL | Status: DC

## 2015-04-11 NOTE — Progress Notes (Signed)
  Hailey [name] - 80 y.o. female MRN [id number]  Date of birth: Jan 08, 1935 Hailey [name] is a 80 y.o. female who presents today for left leg pain.   She was at a beach house on 7/3 and fell while she was coming out of the water. She is unsure if she hit something when she fell. She didn't hear a pop.  Patient was evaluated at in the emergency department at [facility].  A hematoma developed about an hour after the fall.  Report of the imaging was negative for tib/fib fracture and had evidence of soft tissue swelling. Also showed healed mid to distal tibial fracture. She reports having a fracture when she was 80 years old.  She has had pain in there anterior aspect of proximal tibia originating at her hematoma that radiates distally to her ankle. The pain is worse after she stands or sits for a period of time. This is bothersome to her since she is able to do that things that she wants.  Denies any weakness, popping or locking of her knee.  She has placed an ACE wrap on her knee with some improvement.   PMHx - reviewed.  Contributory factors include: Afib, CAD  PSHx - reviewed Medications - Eliquis, cymbalta,  Pravastatin   ROS Per HPI   Exam:  Filed Vitals:   04/11/15 1457  BP: 137/83   Gen: NAD Cardiorespiratory - Normal respiratory effort.  Knee/Lower leg Exam:  Laterality: right Appearance: roughly 10 cm x 10 cm hematoma present on proximal tib medial aspect. No ecchymosis or erythema  Edema: no knee effusion  Tenderness: yes, upon palpation of hematoma  Range of Motion: normal active and passive ROM  Laxity: none  Maneuvers: Anterior drawer: neg  McMurray's: neg Posterior Drawer: neg  Strength:  Quadricep: 5/5 Hamstring: 5/5 Gait: normal Ankle: normal ROM Some pain with resisted eversion  Neurovascularly intact   Imaging:  Print report as described above.   Limited right lower leg ultrasound: hypoechoic space on the proximal tibia medial side. Roughly 10 cm x 10 cm. No  fracture observed on tibia. Findings consistent with a hematoma in this area.

## 2015-04-11 NOTE — Assessment & Plan Note (Signed)
Occuring after fall at beach most likely 2/2 being on Eliquis.  Reports of imaging in ED after the fall not revealing for fracture.  Limited US showing hematoma  - body helix calf applied. Wear all times except at night  - advised to not apply heat to area. Continue RICE  - written for voltaren gel. She can use this or aspercream - she will f/u in 2 weeks with Dr. Micheline Chapman when Dr. Oneida Alar is also in clinic for repeat US

## 2015-04-12 ENCOUNTER — Ambulatory Visit: Payer: Medicare Other | Admitting: Sports Medicine

## 2015-04-26 ENCOUNTER — Ambulatory Visit (INDEPENDENT_AMBULATORY_CARE_PROVIDER_SITE_OTHER): Payer: Medicare Other | Admitting: Sports Medicine

## 2015-04-26 ENCOUNTER — Encounter: Payer: Self-pay | Admitting: Sports Medicine

## 2015-04-26 VITALS — BP 115/74 | Ht 64.0 in | Wt 170.0 lb

## 2015-04-26 DIAGNOSIS — T148 Other injury of unspecified body region: Secondary | ICD-10-CM

## 2015-04-26 DIAGNOSIS — T148XXA Other injury of unspecified body region, initial encounter: Secondary | ICD-10-CM

## 2015-04-26 NOTE — Progress Notes (Signed)
   Subjective:    Patient ID: Hailey [name], female    DOB: 1935-01-07, 80 y.o.   MRN: [id number]  HPI   Patient comes in today for follow-up on a right lower leg hematoma. She has noticed that the hematoma is decreasing in size. She has been using her body helix compression sleeve. Also using topical Voltaren twice daily. She does still have some mild discomfort in the area but it is tolerable. She is anxious to return to her workout routine.    Review of Systems     Objective:   Physical Exam Well-developed, well-nourished. No acute distress. Awake alert and oriented 3.  Right lower leg: There is still a palpable hematoma along the proximal right lower leg but it has decreased in size since her last office visit. No skin breakdown. No erythema. Minimal tenderness to palpation. Walking without a limp.  MSK ultrasound was performed. Hematoma was well visualized and measured. When compared to her previous ultrasound the hematoma has definitely decreased in size. Anterior to posterior it was measuring 2.5 cm previously and measures 1.76cm today.       Assessment & Plan:  Resolving right lower leg hematoma  Patient is doing well. Continue with compression and continue with Voltaren gel. She may increase activity as tolerated but is cautioned about avoiding those exercises that cause pain. I would like for her to return to the office in 3-4 weeks for a repeat ultrasound. Call with questions or concerns in the interim.

## 2015-04-29 ENCOUNTER — Encounter: Payer: Self-pay | Admitting: Internal Medicine

## 2015-04-29 ENCOUNTER — Ambulatory Visit (INDEPENDENT_AMBULATORY_CARE_PROVIDER_SITE_OTHER): Payer: Medicare Other | Admitting: Internal Medicine

## 2015-04-29 ENCOUNTER — Telehealth: Payer: Self-pay | Admitting: Internal Medicine

## 2015-04-29 ENCOUNTER — Other Ambulatory Visit (INDEPENDENT_AMBULATORY_CARE_PROVIDER_SITE_OTHER): Payer: Medicare Other

## 2015-04-29 VITALS — BP 138/90 | HR 57 | Temp 97.9°F

## 2015-04-29 DIAGNOSIS — T148 Other injury of unspecified body region: Secondary | ICD-10-CM

## 2015-04-29 DIAGNOSIS — I482 Chronic atrial fibrillation, unspecified: Secondary | ICD-10-CM

## 2015-04-29 DIAGNOSIS — Z889 Allergy status to unspecified drugs, medicaments and biological substances status: Secondary | ICD-10-CM

## 2015-04-29 DIAGNOSIS — Z789 Other specified health status: Secondary | ICD-10-CM

## 2015-04-29 DIAGNOSIS — I480 Paroxysmal atrial fibrillation: Secondary | ICD-10-CM

## 2015-04-29 DIAGNOSIS — N259 Disorder resulting from impaired renal tubular function, unspecified: Secondary | ICD-10-CM

## 2015-04-29 DIAGNOSIS — T148XXA Other injury of unspecified body region, initial encounter: Secondary | ICD-10-CM

## 2015-04-29 LAB — BASIC METABOLIC PANEL
BUN: 21 mg/dL (ref 6–23)
CO2: 24 mEq/L (ref 19–32)
Calcium: 9.4 mg/dL (ref 8.4–10.5)
Chloride: 104 mEq/L (ref 96–112)
Creatinine, Ser: 1.33 mg/dL — ABNORMAL HIGH (ref 0.40–1.20)
GFR: 40.97 mL/min — ABNORMAL LOW (ref >60.00)
Glucose, Bld: 97 mg/dL (ref 70–99)
Potassium: 4.3 mEq/L (ref 3.5–5.1)
Sodium: 138 mEq/L (ref 135–145)

## 2015-04-29 LAB — HEPATIC FUNCTION PANEL
ALT: 16 U/L (ref 0–35)
AST: 24 U/L (ref 0–37)
Albumin: 4.2 g/dL (ref 3.5–5.2)
Alkaline Phosphatase: 72 U/L (ref 39–117)
Bilirubin, Direct: 0.1 mg/dL (ref 0.0–0.3)
Total Bilirubin: 0.4 mg/dL (ref 0.2–1.2)
Total Protein: 7.2 g/dL (ref 6.0–8.3)

## 2015-04-29 LAB — CBC WITH DIFFERENTIAL/PLATELET
Basophils Absolute: 0 10*3/uL (ref 0.0–0.1)
Basophils Relative: 0.4 % (ref 0.0–3.0)
Eosinophils Absolute: 0.2 10*3/uL (ref 0.0–0.7)
Eosinophils Relative: 2.8 % (ref 0.0–5.0)
HCT: 36.4 % (ref 36.0–46.0)
Hemoglobin: 11.9 g/dL — ABNORMAL LOW (ref 12.0–15.0)
Lymphocytes Relative: 20.5 % (ref 12.0–46.0)
Lymphs Abs: 1.7 10*3/uL (ref 0.7–4.0)
MCHC: 32.8 g/dL (ref 30.0–36.0)
MCV: 83.5 fl (ref 78.0–100.0)
Monocytes Absolute: 0.9 10*3/uL (ref 0.1–1.0)
Monocytes Relative: 10.4 % (ref 3.0–12.0)
Neutro Abs: 5.4 10*3/uL (ref 1.4–7.7)
Neutrophils Relative %: 65.9 % (ref 43.0–77.0)
Platelets: 305 10*3/uL (ref 150.0–400.0)
RBC: 4.36 Mil/uL (ref 3.87–5.11)
RDW: 15.4 % (ref 11.5–15.5)
WBC: 8.2 10*3/uL (ref 4.0–10.5)

## 2015-04-29 MED ORDER — DIGOXIN 125 MCG PO TABS: 0.1250 mg | ORAL_TABLET | ORAL | Status: DC

## 2015-04-29 MED ORDER — SUMATRIPTAN SUCCINATE 100 MG PO TABS
100.0000 mg | ORAL_TABLET | ORAL | Status: DC | PRN
Start: 2015-04-29 — End: 2015-09-13

## 2015-04-29 NOTE — Assessment & Plan Note (Addendum)
Labs Reduce digoxin dose due to bradycardia

## 2015-04-29 NOTE — Telephone Encounter (Signed)
Ok 4 pm Thx

## 2015-04-29 NOTE — Telephone Encounter (Signed)
Pt has been having some pretty bad headaches and last night was the worst and tylenol did not help. She was wondering if she can be worked in today. Please advise

## 2015-04-29 NOTE — Progress Notes (Signed)
Subjective:  Patient ID: Hailey [name], female    DOB: Dec 14, 1934  Age: 80 y.o. MRN: [id number]  CC: Headache   HPI Hailey [name] presents for RLE injury on Jul 3d C/o stress w/brother - pancreatic cancer C/o HAs - worse lately - possibly after she stopped Diltiazem (due to ankle swelling) and started Digoxin. H/o HAs  Outpatient Prescriptions Prior to Visit  Medication Sig Dispense Refill  . cetirizine (ZYRTEC) 10 MG tablet Take 10 mg by mouth daily as needed.     . clobetasol cream (TEMOVATE) 0.05 % APPLY NIGHTLY AS DIRECTED. 15 g 1  . diclofenac sodium (VOLTAREN) 1 % GEL Apply 2 g topically 4 (four) times daily. 100 g 1  . DULoxetine (CYMBALTA) 60 MG capsule TAKE (1) CAPSULE DAILY. 90 capsule 1  . ELIQUIS 5 MG TABS tablet TAKE 1 TABLET TWICE DAILY. 60 tablet 3  . esomeprazole (NEXIUM) 40 MG capsule Take 40 mg by mouth daily before breakfast.      . estradiol (CLIMARA - DOSED IN MG/24 HR) 0.0375 mg/24hr patch APPLY 1 PATCH ONCE WEEKLY AS DIRECTED. 4 patch 12  . LORazepam (ATIVAN) 1 MG tablet Take 1 mg by mouth 2 (two) times daily as needed for anxiety or sleep.    . pravastatin (PRAVACHOL) 20 MG tablet Take 1 tablet (20 mg total) by mouth every Monday, Wednesday, and Friday.    . digoxin (LANOXIN) 0.125 MG tablet Take 1 tablet (0.125 mg total) by mouth daily. 30 tablet 11  . furosemide (LASIX) 20 MG tablet     . HYDROcodone-acetaminophen (NORCO) 10-325 MG per tablet     . diltiazem (CARDIZEM CD) 180 MG 24 hr capsule      No facility-administered medications prior to visit.    ROS Review of Systems  Constitutional: Positive for fatigue. Negative for chills, activity change, appetite change and unexpected weight change.  HENT: Negative for congestion, mouth sores and sinus pressure.   Eyes: Negative for visual disturbance.  Respiratory: Negative for cough and chest tightness.   Gastrointestinal: Negative for nausea and abdominal pain.  Genitourinary: Negative for frequency,  difficulty urinating and vaginal pain.  Musculoskeletal: Negative for back pain and gait problem.  Skin: Negative for pallor and rash.  Neurological: Negative for dizziness, tremors, weakness, numbness and headaches.  Psychiatric/Behavioral: Negative for suicidal ideas, confusion and sleep disturbance.    Objective:  BP 138/90 mmHg  Pulse 57  Temp(Src) 97.9 F (36.6 C) (Oral)  SpO2 95%  BP Readings from Last 3 Encounters:  04/29/15 138/90  04/26/15 115/74  04/11/15 137/83    Wt Readings from Last 3 Encounters:  04/26/15 170 lb (77.111 kg)  04/11/15 170 lb (77.111 kg)  02/18/15 173 lb 3.2 oz (78.563 kg)    Physical Exam  Constitutional: She appears well-developed. No distress.  HENT:  Head: Normocephalic.  Right Ear: External ear normal.  Left Ear: External ear normal.  Nose: Nose normal.  Mouth/Throat: Oropharynx is clear and moist.  Eyes: Conjunctivae are normal. Pupils are equal, round, and reactive to light. Right eye exhibits no discharge. Left eye exhibits no discharge.  Neck: Normal range of motion. Neck supple. No JVD present. No tracheal deviation present. No thyromegaly present.  Cardiovascular: Regular rhythm and normal heart sounds.  Exam reveals no friction rub.   No murmur heard. Pulmonary/Chest: No stridor. No respiratory distress. She has no wheezes.  Abdominal: Soft. Bowel sounds are normal. She exhibits no distension and no mass. There is no tenderness. There  is no rebound and no guarding.  Musculoskeletal: She exhibits no edema or tenderness.  Lymphadenopathy:    She has no cervical adenopathy.  Neurological: She displays normal reflexes. No cranial nerve deficit. She exhibits normal muscle tone. Coordination normal.  Skin: No rash noted. No erythema.  Psychiatric: Her behavior is normal. Judgment and thought content normal.  Bradycardic R prox tibia hematoma Sad  Lab Results  Component Value Date   WBC 7.2 08/30/2014   HGB 12.0 08/30/2014   HCT  37.1 08/30/2014   PLT 274.0 08/30/2014   GLUCOSE 95 02/02/2015   CHOL 153 07/04/2014   TRIG 97 07/04/2014   HDL 69 07/04/2014   LDLDIRECT 89.1 06/29/2013   LDLCALC 65 07/04/2014   ALT 13 08/30/2014   AST 25 08/30/2014   NA 136 02/02/2015   K 3.9 02/02/2015   CL 102 02/02/2015   CREATININE 1.27* 02/02/2015   BUN 19 02/02/2015   CO2 28 02/02/2015   TSH 1.37 01/25/2014   INR 1.27 07/04/2014   HGBA1C 5.6 07/04/2014    Dg Hip Unilat With Pelvis 2-3 Views Left  12/23/2014   CLINICAL DATA:  Pain in both hips, left greater than right  EXAM: LEFT HIP (WITH PELVIS) 2-3 VIEWS  COMPARISON:  CT abdomen pelvis of 07/03/2014  FINDINGS: There is moderate degenerative joint disease of the hips for age which is unchanged compared to the images from the CT of October of 2015. No acute abnormality is seen. There is degenerative change in the lumbar spine where there is a mild scoliosis present. The pelvic rami are intact and the SI joints appear corticated.  IMPRESSION: Moderate degenerative change of the hips. Degenerative change in the lower lumbar spine.   Electronically Signed   By: Ivar Drape M.D.   On: 12/23/2014 14:56    Assessment & Plan:   Hailey [name] was seen today for headache.  Diagnoses and all orders for this visit:  Chronic a-fib -     digoxin (LANOXIN) 0.125 MG tablet; Take 1 tablet (0.125 mg total) by mouth every other day.  PAF (paroxysmal atrial fibrillation) -     CBC with Differential/Platelet; Future -     Basic metabolic panel; Future -     TSH; Future -     Hepatic function panel; Future -     Digoxin level; Future  Other orders -     SUMAtriptan (IMITREX) 100 MG tablet; Take 1 tablet (100 mg total) by mouth every 2 (two) hours as needed for migraine. May repeat in 2 hours if headache persists or recurs.  I have discontinued Hailey [name]'s diltiazem. I have also changed her digoxin. Additionally, I am having her start on SUMAtriptan. Lastly, I am having her maintain her  esomeprazole, cetirizine, LORazepam, estradiol, DULoxetine, pravastatin, ELIQUIS, clobetasol cream, diclofenac sodium, furosemide, and HYDROcodone-acetaminophen.  Meds ordered this encounter  Medications  . digoxin (LANOXIN) 0.125 MG tablet    Sig: Take 1 tablet (0.125 mg total) by mouth every other day.    Dispense:  30 tablet    Refill:  11  . SUMAtriptan (IMITREX) 100 MG tablet    Sig: Take 1 tablet (100 mg total) by mouth every 2 (two) hours as needed for migraine. May repeat in 2 hours if headache persists or recurs.    Dispense:  12 tablet    Refill:  3     Follow-up: Return in about 4 weeks (around 05/27/2015) for a follow-up visit.  Walker Kehr, MD

## 2015-04-29 NOTE — Telephone Encounter (Signed)
Pt informed

## 2015-04-29 NOTE — Progress Notes (Signed)
Pre visit review using our clinic review tool, if applicable. No additional management support is needed unless otherwise documented below in the visit note. 

## 2015-04-30 LAB — DIGOXIN LEVEL: Digoxin Level: 0.6 ug/L — ABNORMAL LOW (ref 0.8–2.0)

## 2015-05-01 NOTE — Assessment & Plan Note (Signed)
Swelling resolved off diltiazem

## 2015-05-01 NOTE — Assessment & Plan Note (Signed)
7/16 RLE Gentle warm compress

## 2015-05-01 NOTE — Assessment & Plan Note (Signed)
labs

## 2015-05-02 LAB — TSH: TSH: 2.11 u[IU]/mL (ref 0.35–4.50)

## 2015-05-03 ENCOUNTER — Ambulatory Visit: Payer: Medicare Other | Admitting: Internal Medicine

## 2015-05-03 ENCOUNTER — Telehealth: Payer: Self-pay | Admitting: Internal Medicine

## 2015-05-03 NOTE — Telephone Encounter (Signed)
Pt informed of below.   Entered by Cassandria Anger, MD at 05/02/2015 9:03 PM    Dear Hailey [name],  Your labs/tests are stable. Plan - as we discussed.  Sincerely,  Walker Kehr, MD

## 2015-05-03 NOTE — Telephone Encounter (Signed)
Pt called in and would like a nurse to call her back with lab results   Best number is her cell number

## 2015-05-04 ENCOUNTER — Telehealth: Payer: Self-pay | Admitting: Interventional Cardiology

## 2015-05-04 NOTE — Telephone Encounter (Signed)
New message     Pt is having a headache every night, around 5 Pt is taking migraine medication for the headaches Pt states she thinks it is medication that is causing the headaches

## 2015-05-04 NOTE — Telephone Encounter (Signed)
We stopped diltiazem on last visit and started Digoxin. We stopped diltiazem due to swelling. We stopped metoprolol due to hair loss. Running out of options.   Needs BP checks to be sure elevation is not causing headache.  Could switch to Carvedilol 6.25 mg BID and titrate.

## 2015-05-04 NOTE — Telephone Encounter (Signed)
Returned pt call. Pt sts that she has been having headaches daily. She was seen on 8/19  by her pcp Dr.Plotnikov. He recommended she reduce digoxin to every other, as dig could cause headaches, he also prescribed Imitrex. Pt reports that she has not yet had any improvement. She would like Dr.Smith's recommendation on reducing dig. Pt sts that she was previously taking Metoprolol. It was stopped because she felt it was contributing to her hair loss,  She is still losing hair and now feels Metoprolol was not the cause. Adv pt I will fwd an update to Dr. Tamala Julian and call back with his recommendation. Pt agreeable with plan and verbalized understanding.

## 2015-05-05 ENCOUNTER — Telehealth: Payer: Self-pay | Admitting: *Deleted

## 2015-05-05 ENCOUNTER — Other Ambulatory Visit: Payer: Self-pay | Admitting: Internal Medicine

## 2015-05-05 NOTE — Telephone Encounter (Signed)
Called to give Dr.Smith's response.lmtcb

## 2015-05-05 NOTE — Telephone Encounter (Signed)
Pt aware of Dr.Smith's recommendations. Pt agreeable with plan Pt sts that she does have a bp machine at home. She will monitor her bp daily for 5-7 days and call the office with her readings.

## 2015-05-05 NOTE — Telephone Encounter (Signed)
Rf req for Lorazepam 1 mg 1 po bid prn. # 180. Last filled 11/11/2014. Ok to Rf?

## 2015-05-05 NOTE — Telephone Encounter (Signed)
Returned pt call. 2nd attempt to reach pt. lmtcb

## 2015-05-05 NOTE — Telephone Encounter (Signed)
Follow up ° ° °Pt returning your call °

## 2015-05-06 ENCOUNTER — Telehealth: Payer: Self-pay | Admitting: Internal Medicine

## 2015-05-06 ENCOUNTER — Encounter: Payer: Self-pay | Admitting: Internal Medicine

## 2015-05-06 ENCOUNTER — Ambulatory Visit (INDEPENDENT_AMBULATORY_CARE_PROVIDER_SITE_OTHER): Payer: Medicare Other | Admitting: Internal Medicine

## 2015-05-06 VITALS — BP 150/90 | HR 73 | Wt 170.0 lb

## 2015-05-06 DIAGNOSIS — I10 Essential (primary) hypertension: Secondary | ICD-10-CM | POA: Diagnosis not present

## 2015-05-06 DIAGNOSIS — R519 Headache, unspecified: Secondary | ICD-10-CM

## 2015-05-06 DIAGNOSIS — R51 Headache: Secondary | ICD-10-CM | POA: Diagnosis not present

## 2015-05-06 DIAGNOSIS — I480 Paroxysmal atrial fibrillation: Secondary | ICD-10-CM | POA: Diagnosis not present

## 2015-05-06 MED ORDER — CARVEDILOL 6.25 MG PO TABS: 6.2500 mg | ORAL_TABLET | Freq: Two times a day (BID) | ORAL | Status: DC

## 2015-05-06 NOTE — Telephone Encounter (Signed)
Pt was seen today Thx 

## 2015-05-06 NOTE — Telephone Encounter (Signed)
OK to fill this prescription with additional refills x1 Thank you!  

## 2015-05-06 NOTE — Progress Notes (Signed)
Pre visit review using our clinic review tool, if applicable. No additional management support is needed unless otherwise documented below in the visit note. 

## 2015-05-06 NOTE — Telephone Encounter (Signed)
Patient was told to decrease medication to three times a day for digoxin (LANOXIN) 0.125 MG tablet [[id number] Her bp has gone way up and she is to leave town this afternoon around noon and she is worried about leaving. Can you call her this morning asap at [phone]

## 2015-05-06 NOTE — Progress Notes (Signed)
Subjective:  Patient ID: Hailey [name], female    DOB: Apr 22, 1935  Age: 80 y.o. MRN: [id number]  CC: No chief complaint on file.   HPI PRAGYA LOFASO presents for HAs and elev BP f/u Hx: HAs -  Started after she stopped Diltiazem (due to ankle swelling) and started Digoxin.  Outpatient Prescriptions Prior to Visit  Medication Sig Dispense Refill  . cetirizine (ZYRTEC) 10 MG tablet Take 10 mg by mouth daily as needed.     . clobetasol cream (TEMOVATE) 0.05 % APPLY NIGHTLY AS DIRECTED. 15 g 1  . diclofenac sodium (VOLTAREN) 1 % GEL Apply 2 g topically 4 (four) times daily. 100 g 1  . digoxin (LANOXIN) 0.125 MG tablet Take 1 tablet (0.125 mg total) by mouth every other day. 30 tablet 11  . DULoxetine (CYMBALTA) 60 MG capsule TAKE (1) CAPSULE DAILY. 90 capsule 3  . ELIQUIS 5 MG TABS tablet TAKE 1 TABLET TWICE DAILY. 60 tablet 3  . esomeprazole (NEXIUM) 40 MG capsule Take 40 mg by mouth daily before breakfast.      . estradiol (CLIMARA - DOSED IN MG/24 HR) 0.0375 mg/24hr patch APPLY 1 PATCH ONCE WEEKLY AS DIRECTED. 4 patch 12  . furosemide (LASIX) 20 MG tablet     . HYDROcodone-acetaminophen (NORCO) 10-325 MG per tablet     . LORazepam (ATIVAN) 1 MG tablet Take 1 mg by mouth 2 (two) times daily as needed for anxiety or sleep.    . pravastatin (PRAVACHOL) 20 MG tablet Take 1 tablet (20 mg total) by mouth every Monday, Wednesday, and Friday.    . SUMAtriptan (IMITREX) 100 MG tablet Take 1 tablet (100 mg total) by mouth every 2 (two) hours as needed for migraine. May repeat in 2 hours if headache persists or recurs. 12 tablet 3   No facility-administered medications prior to visit.    ROS Review of Systems  Constitutional: Negative for chills, activity change, appetite change, fatigue and unexpected weight change.  HENT: Negative for congestion, mouth sores and sinus pressure.   Eyes: Negative for redness and visual disturbance.  Respiratory: Negative for cough, chest tightness and  shortness of breath.   Cardiovascular: Negative for chest pain and leg swelling.  Gastrointestinal: Negative for nausea, abdominal pain and abdominal distention.  Genitourinary: Negative for frequency, difficulty urinating and vaginal pain.  Musculoskeletal: Negative for back pain and gait problem.  Skin: Negative for pallor and rash.  Neurological: Positive for headaches. Negative for dizziness, tremors, weakness and numbness.  Psychiatric/Behavioral: Negative for confusion and sleep disturbance.    Objective:  BP 150/90 mmHg  Pulse 73  Wt 170 lb (77.111 kg)  SpO2 96%  BP Readings from Last 3 Encounters:  05/06/15 150/90  04/29/15 138/90  04/26/15 115/74    Wt Readings from Last 3 Encounters:  05/06/15 170 lb (77.111 kg)  04/26/15 170 lb (77.111 kg)  04/11/15 170 lb (77.111 kg)    Physical Exam  Constitutional: She appears well-developed. No distress.  HENT:  Head: Normocephalic.  Right Ear: External ear normal.  Left Ear: External ear normal.  Nose: Nose normal.  Mouth/Throat: Oropharynx is clear and moist.  Eyes: Conjunctivae are normal. Pupils are equal, round, and reactive to light. Right eye exhibits no discharge. Left eye exhibits no discharge.  Neck: Normal range of motion. Neck supple. No JVD present. No tracheal deviation present. No thyromegaly present.  Cardiovascular: Normal rate, regular rhythm and normal heart sounds.   Pulmonary/Chest: No stridor. No respiratory distress.  She has no wheezes.  Abdominal: Soft. Bowel sounds are normal. She exhibits no distension and no mass. There is no tenderness. There is no rebound and no guarding.  Musculoskeletal: She exhibits no edema or tenderness.  Lymphadenopathy:    She has no cervical adenopathy.  Neurological: She displays normal reflexes. No cranial nerve deficit. She exhibits normal muscle tone. Coordination normal.  Skin: No rash noted. No erythema.  Psychiatric: She has a normal mood and affect. Her behavior  is normal. Judgment and thought content normal.    Lab Results  Component Value Date   WBC 8.2 04/29/2015   HGB 11.9* 04/29/2015   HCT 36.4 04/29/2015   PLT 305.0 04/29/2015   GLUCOSE 97 04/29/2015   CHOL 153 07/04/2014   TRIG 97 07/04/2014   HDL 69 07/04/2014   LDLDIRECT 89.1 06/29/2013   LDLCALC 65 07/04/2014   ALT 16 04/29/2015   AST 24 04/29/2015   NA 138 04/29/2015   K 4.3 04/29/2015   CL 104 04/29/2015   CREATININE 1.33* 04/29/2015   BUN 21 04/29/2015   CO2 24 04/29/2015   TSH 2.11 04/29/2015   INR 1.27 07/04/2014   HGBA1C 5.6 07/04/2014    Dg Hip Unilat With Pelvis 2-3 Views Left  12/23/2014   CLINICAL DATA:  Pain in both hips, left greater than right  EXAM: LEFT HIP (WITH PELVIS) 2-3 VIEWS  COMPARISON:  CT abdomen pelvis of 07/03/2014  FINDINGS: There is moderate degenerative joint disease of the hips for age which is unchanged compared to the images from the CT of October of 2015. No acute abnormality is seen. There is degenerative change in the lumbar spine where there is a mild scoliosis present. The pelvic rami are intact and the SI joints appear corticated.  IMPRESSION: Moderate degenerative change of the hips. Degenerative change in the lower lumbar spine.   Electronically Signed   By: Ivar Drape M.D.   On: 12/23/2014 14:56    Assessment & Plan:   There are no diagnoses linked to this encounter. I am having Ms. Laton start on carvedilol. I am also having her maintain her esomeprazole, cetirizine, LORazepam, estradiol, pravastatin, ELIQUIS, clobetasol cream, diclofenac sodium, furosemide, HYDROcodone-acetaminophen, digoxin, SUMAtriptan, and DULoxetine.  Meds ordered this encounter  Medications  . carvedilol (COREG) 6.25 MG tablet    Sig: Take 1 tablet (6.25 mg total) by mouth 2 (two) times daily with a meal.    Dispense:  60 tablet    Refill:  5     Follow-up: Return in about 6 weeks (around 06/17/2015) for a follow-up visit.  Walker Kehr, MD

## 2015-05-07 ENCOUNTER — Other Ambulatory Visit: Payer: Self-pay | Admitting: Internal Medicine

## 2015-05-08 ENCOUNTER — Encounter: Payer: Self-pay | Admitting: Internal Medicine

## 2015-05-08 NOTE — Assessment & Plan Note (Signed)
Low dose Digoxin, Eliquis Coreg started 8/16

## 2015-05-08 NOTE — Assessment & Plan Note (Signed)
8/16 Migraines - exacerbation is poss related to HTN Imitrex prn Started Coreg for better BP control

## 2015-05-08 NOTE — Assessment & Plan Note (Signed)
We are starting Coreg

## 2015-05-09 MED ORDER — LORAZEPAM 1 MG PO TABS: 1.0000 mg | ORAL_TABLET | Freq: Two times a day (BID) | ORAL | Status: DC | PRN

## 2015-05-09 NOTE — Telephone Encounter (Signed)
Script faxed back to [facility] [place]...Johny Chess

## 2015-05-17 ENCOUNTER — Ambulatory Visit: Payer: Medicare Other | Admitting: Interventional Cardiology

## 2015-05-24 DIAGNOSIS — Z7901 Long term (current) use of anticoagulants: Secondary | ICD-10-CM | POA: Insufficient documentation

## 2015-05-24 NOTE — Progress Notes (Addendum)
Cardiology Office Note   Date:  05/25/2015   ID:  Hailey [name], DOB [birth year], MRN [id number]  PCP:  Walker Kehr, MD  Cardiologist:  Sinclair Grooms, MD   Chief Complaint  Patient presents with  . Atrial Fibrillation      History of Present Illness: Hailey [name] is a 80 y.o. female who presents for  Chronicatrial fibrillation,  Chronic diastolic heart failure when in atrial fibrillation ventricular response is poorly controlled, hypertension, and obesity. She is on chronic anticoagulation therapy since diagnosis of atrial fib.  Prior history of metoprolol and diltiazem discontinuation due to hair loss.  Overall the patient is doing well. She denies angina. She denies orthopnea, PND, and exertional intolerance. She has noted mild lower extremity edema. She has not had chest discomfort.  She became concerned after diltiazem was discontinued several weeks ago because there was a increase in blood pressure. She saw her primary physician, Dr. Casimiro Needle. She was having headaches at the time. I'd substituted digoxin for diltiazem to help with rate control. Digoxin was decreased to every other day because of the possibility that headaches were a side effect.  The headaches are improved. He added carvedilol 6.25 mg twice a day. She has a long of her recent blood pressures on carvedilol, and they are acceptable. Heart rate also appears to be acceptable.  Because of headaches her primary care has started somatropin.   Past Medical History  Diagnosis Date  . History of colon polyps   . GERD (gastroesophageal reflux disease)   . HTN (hypertension)   . SVT (supraventricular tachycardia)     brief history  . PAC (premature atrial contraction)     Symptomatiic  . Anxiety   . Allergic rhinitis   . IBS (irritable bowel syndrome)     constipation predominant - Dr Earlean Shawl  . Diverticulosis of colon   . Hip bursitis 2010    Dr Para March, Post op seroma  . Osteopenia 12/2011    T score  -1.3 FRAX 10%/1.9%  . Atrial fibrillation   . Lichen sclerosus     Past Surgical History  Procedure Laterality Date  . Bladder surgery    . Breast reduction surgery    . Appendectomy    . Troch bursa resection  2010  . Vaginal hysterectomy      BSO  . Oophorectomy      BSO  . Toe surgery       Current Outpatient Prescriptions  Medication Sig Dispense Refill  . carvedilol (COREG) 6.25 MG tablet Take 1 tablet (6.25 mg total) by mouth 2 (two) times daily with a meal. 60 tablet 5  . cetirizine (ZYRTEC) 10 MG tablet Take 10 mg by mouth daily as needed.     . clobetasol cream (TEMOVATE) 0.05 % APPLY NIGHTLY AS DIRECTED. 15 g 1  . digoxin (LANOXIN) 0.125 MG tablet Take 1 tablet (0.125 mg total) by mouth every other day. 30 tablet 11  . DULoxetine (CYMBALTA) 60 MG capsule TAKE (1) CAPSULE DAILY. 90 capsule 3  . ELIQUIS 5 MG TABS tablet TAKE 1 TABLET TWICE DAILY. 60 tablet 3  . esomeprazole (NEXIUM) 40 MG capsule Take 40 mg by mouth daily before breakfast.      . estradiol (CLIMARA - DOSED IN MG/24 HR) 0.0375 mg/24hr patch APPLY 1 PATCH ONCE WEEKLY AS DIRECTED. 4 patch 12  . HYDROcodone-acetaminophen (NORCO) 10-325 MG per tablet     . LORazepam (ATIVAN) 1 MG tablet Take 1 tablet (  1 mg total) by mouth 2 (two) times daily as needed for anxiety or sleep. 30 tablet 1  . SUMAtriptan (IMITREX) 100 MG tablet Take 1 tablet (100 mg total) by mouth every 2 (two) hours as needed for migraine. May repeat in 2 hours if headache persists or recurs. 12 tablet 3  . pravastatin (PRAVACHOL) 20 MG tablet Take 1 tablet (20 mg total) by mouth every Monday, Wednesday, and Friday. 45 tablet 3   No current facility-administered medications for this visit.    Allergies:   Meloxicam; Toprol xl; Sulfamethoxazole-trimethoprim; and Tramadol    Social History:  The patient  reports that she quit smoking about 39 years ago. She has never used smokeless tobacco. She reports that she drinks about 3.6 oz of alcohol per  week. She reports that she does not use illicit drugs.   Family History:  The patient's family history includes Cancer in her brother and father; Colon cancer in her mother; Diabetes in her father. There is no history of Heart attack or Stroke.    ROS:  Please see the history of present illness.   Otherwise, review of systems are positive for mild lower extremity swelling, vision disturbance, cough, and history of snoring although according to her the husband states it is very mild. She does not have excessive daytime sleepiness..   All other systems are reviewed and negative.    PHYSICAL EXAM: VS:  BP 152/90 mmHg  Pulse 66  Ht 5\' 4"  (1.626 m)  Wt 78.926 kg (174 lb)  BMI 29.85 kg/m2  SpO2 98% , BMI Body mass index is 29.85 kg/(m^2). GEN: Well nourished, well developed, in no acute distressPeriod obese HEENT: normal Neck: no JVD, carotid bruits, or masses Cardiac: IIRR.  There is no murmur, rub, or gallop. There is no edema. Respiratory:  clear to auscultation bilaterally, normal work of breathing. GI: soft, nontender, nondistended, + BS MS: no deformity or atrophy Skin: warm and dry, no rash Neuro:  Strength and sensation are intact Psych: euthymic mood, full affect   EKG:  EKG is not ordered today.  Recent Labs: 04/29/2015: ALT 16; BUN 21; Creatinine, Ser 1.33*; Hemoglobin 11.9*; Platelets 305.0; Potassium 4.3; Sodium 138; TSH 2.11    Lipid Panel    Component Value Date/Time   CHOL 153 07/04/2014 0528   TRIG 97 07/04/2014 0528   TRIG 183* 07/26/2006 0903   HDL 69 07/04/2014 0528   CHOLHDL 2.2 07/04/2014 0528   CHOLHDL 2.8 CALC 07/26/2006 0903   VLDL 19 07/04/2014 0528   LDLCALC 65 07/04/2014 0528   LDLDIRECT 89.1 06/29/2013 1604      Wt Readings from Last 3 Encounters:  05/25/15 78.926 kg (174 lb)  05/06/15 77.111 kg (170 lb)  04/26/15 77.111 kg (170 lb)      Other studies Reviewed: Additional studies/ records that were reviewed today include: None.  .    ASSESSMENT AND PLAN:  1.  Chronic atrial fibrillation Controlled rate on both beta blocker and low-dose digoxin. I will discontinue the digoxin. This may be contributing to her headaches.  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 Essential hypertension Elevated but on repeat 140/85 mmHg.  Home blood pressures even better than this. She will continue to monitor her pressures and is now willing to stay with beta blocker therapy.  4. Chronic anticoagulation No bleeding complications  5. Lower extremity edema Not a significant clinical problem at this time   6. Recent headaches since starting digoxin therapy.  Plan to  discontinue now that she is back on a beta blocker. If headaches persist, she may need a brain scan to rule out bleeding  Current medicines are reviewed at length with the patient today.  The patient has the following concerns regarding medicines: None.  The following changes/actions have been instituted:    Discontinue digoxin  Blood pressure recordings daily for 2 weeks. Notify us if consistent blood pressures greater than 140/90 mmHg a heart rates greater than 85 bpm. If blood pressures are not well controlled, we should increase carvedilol to 12.5 mg twice a day.  Office visit in 6 months   Consider brain scan to rule out subdural if headaches continue after discontinuation of digoxin  Labs/ tests ordered today include:  No orders of the defined types were placed in this encounter.     Disposition:   FU with HS in 6 months  Signed, Sinclair Grooms, MD  05/25/2015 12:06 PM    [address], [place], [place]  [address] Phone: [phone]; Fax: [phone]

## 2015-05-25 ENCOUNTER — Ambulatory Visit (INDEPENDENT_AMBULATORY_CARE_PROVIDER_SITE_OTHER): Payer: Medicare Other | Admitting: Interventional Cardiology

## 2015-05-25 ENCOUNTER — Encounter: Payer: Self-pay | Admitting: Interventional Cardiology

## 2015-05-25 VITALS — BP 152/90 | HR 66 | Ht 64.0 in | Wt 174.0 lb

## 2015-05-25 DIAGNOSIS — I480 Paroxysmal atrial fibrillation: Secondary | ICD-10-CM | POA: Diagnosis not present

## 2015-05-25 DIAGNOSIS — D681 Hereditary factor XI deficiency: Secondary | ICD-10-CM

## 2015-05-25 DIAGNOSIS — I10 Essential (primary) hypertension: Secondary | ICD-10-CM

## 2015-05-25 DIAGNOSIS — Z7901 Long term (current) use of anticoagulants: Secondary | ICD-10-CM | POA: Diagnosis not present

## 2015-05-25 DIAGNOSIS — Z79899 Other long term (current) drug therapy: Secondary | ICD-10-CM

## 2015-05-25 MED ORDER — PRAVASTATIN SODIUM 20 MG PO TABS: 20.0000 mg | ORAL_TABLET | ORAL | Status: DC

## 2015-05-25 NOTE — Patient Instructions (Signed)
Medication Instructions:  Your physician has recommended you make the following change in your medication:  STOP Digoxin  Labwork: Non ordered  Testing/Procedures: None ordered  Follow-Up: Your physician wants you to follow-up in: 6 months with Dr.Smith You will receive a reminder letter in the mail two months in advance. If you don't receive a letter, please call our office to schedule the follow-up appointment.   Any Other Special Instructions Will Be Listed Below (If Applicable). Measure your blood pressure and heart rate daily for 2 weeks. Your bp goal is below 140/90 and heart rate below 85bpm. Call the office with readings.

## 2015-06-08 ENCOUNTER — Ambulatory Visit (INDEPENDENT_AMBULATORY_CARE_PROVIDER_SITE_OTHER): Payer: Medicare Other | Admitting: Sports Medicine

## 2015-06-08 VITALS — BP 121/78

## 2015-06-08 DIAGNOSIS — T148 Other injury of unspecified body region: Secondary | ICD-10-CM | POA: Diagnosis not present

## 2015-06-08 DIAGNOSIS — T148XXA Other injury of unspecified body region, initial encounter: Secondary | ICD-10-CM

## 2015-06-08 NOTE — Assessment & Plan Note (Addendum)
Resolving, 2.5 --> 1.37cm AP thickness today. No pain/discomfort.  - Recommended she wear compression brace as much as possible.  - f/u PRN

## 2015-06-08 NOTE — Progress Notes (Signed)
  Hailey [name] - 80 y.o. female MRN [id number]  Date of birth: 1935-03-06  CC: f/u left leg hematoma  SUBJECTIVE:   HPI F/u right leg hematoma:  - Continues to decrease in size.  - Pain has resolved for last 3 weeks.  - Has stopped wearing body helix sleeve.  - No longer using voltaren gel - Back to normal level of exercise: 25 minutes on the treadmill. Works out with Clinical research associate.  No walking due to heat outside.   ROS:     Negative other than that listed in HPI.    OBJECTIVE: BP 121/78 mmHg  Physical Exam   Well-developed, well-nourished. No acute distress. Awake alert and oriented 3.  Right lower leg: There is still a palpable hematoma along the proximal, medial right lower leg but it has decreased in size since her last office visit. No skin breakdown. No erythema. No tenderness to palpation. Walking without a limp.  MSK ultrasound was performed. Hematoma was well visualized and measured. When compared to her previous ultrasound the hematoma has definitely decreased in size. Anterior to posterior it was measuring 2.5 cm->1.76cm-> 1.37 outer border with 0.76cm of hypoechoic fluid today.   MEDICATIONS, LABS & OTHER ORDERS: Previous Medications   CARVEDILOL (COREG) 6.25 MG TABLET    Take 1 tablet (6.25 mg total) by mouth 2 (two) times daily with a meal.   CETIRIZINE (ZYRTEC) 10 MG TABLET    Take 10 mg by mouth daily as needed.    CLOBETASOL CREAM (TEMOVATE) 0.05 %    APPLY NIGHTLY AS DIRECTED.   DIGOXIN (LANOXIN) 0.125 MG TABLET    Take 1 tablet (0.125 mg total) by mouth every other day.   DULOXETINE (CYMBALTA) 60 MG CAPSULE    TAKE (1) CAPSULE DAILY.   ELIQUIS 5 MG TABS TABLET    TAKE 1 TABLET TWICE DAILY.   ESOMEPRAZOLE (NEXIUM) 40 MG CAPSULE    Take 40 mg by mouth daily before breakfast.     ESTRADIOL (CLIMARA - DOSED IN MG/24 HR) 0.0375 MG/24HR PATCH    APPLY 1 PATCH ONCE WEEKLY AS DIRECTED.   HYDROCODONE-ACETAMINOPHEN (NORCO) 10-325 MG PER TABLET       LORAZEPAM (ATIVAN) 1 MG  TABLET    Take 1 tablet (1 mg total) by mouth 2 (two) times daily as needed for anxiety or sleep.   PRAVASTATIN (PRAVACHOL) 20 MG TABLET    Take 1 tablet (20 mg total) by mouth every Monday, Wednesday, and Friday.   SUMATRIPTAN (IMITREX) 100 MG TABLET    Take 1 tablet (100 mg total) by mouth every 2 (two) hours as needed for migraine. May repeat in 2 hours if headache persists or recurs.   Modified Medications   No medications on file   New Prescriptions   No medications on file   Discontinued Medications   No medications on file  No orders of the defined types were placed in this encounter.   ASSESSMENT & PLAN: See problem based charting & AVS for pt instructions.

## 2015-06-09 ENCOUNTER — Encounter: Payer: Self-pay | Admitting: Internal Medicine

## 2015-06-09 ENCOUNTER — Ambulatory Visit (INDEPENDENT_AMBULATORY_CARE_PROVIDER_SITE_OTHER): Payer: Medicare Other | Admitting: Internal Medicine

## 2015-06-09 VITALS — BP 110/80 | HR 81 | Wt 173.0 lb

## 2015-06-09 DIAGNOSIS — R51 Headache: Secondary | ICD-10-CM | POA: Diagnosis not present

## 2015-06-09 DIAGNOSIS — Z23 Encounter for immunization: Secondary | ICD-10-CM

## 2015-06-09 DIAGNOSIS — R519 Headache, unspecified: Secondary | ICD-10-CM

## 2015-06-09 DIAGNOSIS — K5730 Diverticulosis of large intestine without perforation or abscess without bleeding: Secondary | ICD-10-CM | POA: Diagnosis not present

## 2015-06-09 DIAGNOSIS — I480 Paroxysmal atrial fibrillation: Secondary | ICD-10-CM | POA: Diagnosis not present

## 2015-06-09 NOTE — Progress Notes (Signed)
Pre visit review using our clinic review tool, if applicable. No additional management support is needed unless otherwise documented below in the visit note. 

## 2015-06-09 NOTE — Progress Notes (Signed)
Subjective:  Patient ID: Hailey [name], female    DOB: [birth year]  Age: 80 y.o. MRN: [id number]  CC: No chief complaint on file.   HPI Hailey [name] presents for HAs - resolving off Digoxin, PAF, dyslipidemia. Brother died of pancreatic cancer.  Outpatient Prescriptions Prior to Visit  Medication Sig Dispense Refill  . carvedilol (COREG) 6.25 MG tablet Take 1 tablet (6.25 mg total) by mouth 2 (two) times daily with a meal. 60 tablet 5  . cetirizine (ZYRTEC) 10 MG tablet Take 10 mg by mouth daily as needed.     . clobetasol cream (TEMOVATE) 0.05 % APPLY NIGHTLY AS DIRECTED. 15 g 1  . DULoxetine (CYMBALTA) 60 MG capsule TAKE (1) CAPSULE DAILY. 90 capsule 3  . ELIQUIS 5 MG TABS tablet TAKE 1 TABLET TWICE DAILY. 60 tablet 3  . esomeprazole (NEXIUM) 40 MG capsule Take 40 mg by mouth daily before breakfast.      . estradiol (CLIMARA - DOSED IN MG/24 HR) 0.0375 mg/24hr patch APPLY 1 PATCH ONCE WEEKLY AS DIRECTED. 4 patch 12  . LORazepam (ATIVAN) 1 MG tablet Take 1 tablet (1 mg total) by mouth 2 (two) times daily as needed for anxiety or sleep. 30 tablet 1  . pravastatin (PRAVACHOL) 20 MG tablet Take 1 tablet (20 mg total) by mouth every Monday, Wednesday, and Friday. 45 tablet 3  . SUMAtriptan (IMITREX) 100 MG tablet Take 1 tablet (100 mg total) by mouth every 2 (two) hours as needed for migraine. May repeat in 2 hours if headache persists or recurs. 12 tablet 3  . HYDROcodone-acetaminophen (NORCO) 10-325 MG per tablet     . digoxin (LANOXIN) 0.125 MG tablet Take 1 tablet (0.125 mg total) by mouth every other day. (Patient not taking: Reported on 06/09/2015) 30 tablet 11   No facility-administered medications prior to visit.    ROS Review of Systems  Constitutional: Negative for chills, activity change, appetite change, fatigue and unexpected weight change.  HENT: Negative for congestion, mouth sores and sinus pressure.   Eyes: Negative for visual disturbance.  Respiratory: Negative for  cough, chest tightness and shortness of breath.   Gastrointestinal: Negative for nausea and abdominal pain.  Genitourinary: Negative for frequency, difficulty urinating and vaginal pain.  Musculoskeletal: Negative for back pain and gait problem.  Skin: Negative for pallor and rash.  Neurological: Negative for dizziness, tremors, weakness, numbness and headaches.  Psychiatric/Behavioral: Negative for suicidal ideas, confusion and sleep disturbance.    Objective:  BP 110/80 mmHg  Pulse 81  Wt 173 lb (78.472 kg)  SpO2 93%  BP Readings from Last 3 Encounters:  06/09/15 110/80  06/08/15 121/78  05/25/15 152/90    Wt Readings from Last 3 Encounters:  06/09/15 173 lb (78.472 kg)  05/25/15 174 lb (78.926 kg)  05/06/15 170 lb (77.111 kg)    Physical Exam  Constitutional: She appears well-developed. No distress.  HENT:  Head: Normocephalic.  Right Ear: External ear normal.  Left Ear: External ear normal.  Nose: Nose normal.  Mouth/Throat: Oropharynx is clear and moist.  Eyes: Conjunctivae are normal. Pupils are equal, round, and reactive to light. Right eye exhibits no discharge. Left eye exhibits no discharge.  Neck: Normal range of motion. Neck supple. No JVD present. No tracheal deviation present. No thyromegaly present.  Cardiovascular: Normal rate and normal heart sounds.   Pulmonary/Chest: No stridor. No respiratory distress. She has no wheezes.  Abdominal: Soft. Bowel sounds are normal. She exhibits no distension and no  mass. There is no tenderness. There is no rebound and no guarding.  Musculoskeletal: She exhibits no edema or tenderness.  Lymphadenopathy:    She has no cervical adenopathy.  Neurological: She displays normal reflexes. No cranial nerve deficit. She exhibits normal muscle tone. Coordination normal.  Skin: No rash noted. No erythema.  Psychiatric: She has a normal mood and affect. Her behavior is normal. Judgment and thought content normal.  irreg rhythm R  shin hematoma - better  Lab Results  Component Value Date   WBC 8.2 04/29/2015   HGB 11.9* 04/29/2015   HCT 36.4 04/29/2015   PLT 305.0 04/29/2015   GLUCOSE 97 04/29/2015   CHOL 153 07/04/2014   TRIG 97 07/04/2014   HDL 69 07/04/2014   LDLDIRECT 89.1 06/29/2013   LDLCALC 65 07/04/2014   ALT 16 04/29/2015   AST 24 04/29/2015   NA 138 04/29/2015   K 4.3 04/29/2015   CL 104 04/29/2015   CREATININE 1.33* 04/29/2015   BUN 21 04/29/2015   CO2 24 04/29/2015   TSH 2.11 04/29/2015   INR 1.27 07/04/2014   HGBA1C 5.6 07/04/2014    Dg Hip Unilat With Pelvis 2-3 Views Left  12/23/2014   CLINICAL DATA:  Pain in both hips, left greater than right  EXAM: LEFT HIP (WITH PELVIS) 2-3 VIEWS  COMPARISON:  CT abdomen pelvis of 07/03/2014  FINDINGS: There is moderate degenerative joint disease of the hips for age which is unchanged compared to the images from the CT of October of 2015. No acute abnormality is seen. There is degenerative change in the lumbar spine where there is a mild scoliosis present. The pelvic rami are intact and the SI joints appear corticated.  IMPRESSION: Moderate degenerative change of the hips. Degenerative change in the lower lumbar spine.   Electronically Signed   By: Ivar Drape M.D.   On: 12/23/2014 14:56    Assessment & Plan:   Diagnoses and all orders for this visit:  Diverticulosis of large intestine without hemorrhage  PAF (paroxysmal atrial fibrillation)  Nonintractable episodic headache, unspecified headache type  Need for influenza vaccination -     Flu Vaccine QUAD 36+ mos IM  I have discontinued Ms. Longanecker's digoxin. I am also having her maintain her esomeprazole, cetirizine, estradiol, ELIQUIS, clobetasol cream, HYDROcodone-acetaminophen, SUMAtriptan, DULoxetine, carvedilol, LORazepam, and pravastatin.  No orders of the defined types were placed in this encounter.     Follow-up: Return in about 4 months (around 10/09/2015) for a follow-up  visit.  Walker Kehr, MD

## 2015-06-09 NOTE — Assessment & Plan Note (Signed)
Low dose Coreg, Eliquis 

## 2015-06-09 NOTE — Assessment & Plan Note (Signed)
No relapse 

## 2015-06-14 ENCOUNTER — Telehealth: Payer: Self-pay | Admitting: Interventional Cardiology

## 2015-06-14 MED ORDER — METOPROLOL SUCCINATE ER 25 MG PO TB24: 37.5000 mg | ORAL_TABLET | Freq: Two times a day (BID) | ORAL | Status: DC

## 2015-06-14 NOTE — Telephone Encounter (Signed)
Increase carvedilol to 12.5 mg twice a day.  Continue to record pressure recordings

## 2015-06-14 NOTE — Telephone Encounter (Signed)
BP reading fwd to Dr.Smith to review

## 2015-06-14 NOTE — Telephone Encounter (Signed)
Pt aware of Dr.Smith's recommendation. Increase carvedilol to 12.5 mg twice a day. Continue to record pressure recordings Pt sts that her preference is to be switched back to Metoprolol as previously prescribed. Pt Metoprolol was d/c because pt complained of hair loss. Pt sts that she has changed the type of brush she used an the hair loss had slowed down a lot. Pt sts that she was getting good bp control on Metoprolol and would like to resume.  Dr.Smith updated. Per Dr.Smith it is ok for pt to switch back to Metoprolol XL 37.5mg  bid. Pt should d/c Carvedilol, resume Metoprolol 37.5mg  bid, Rx sent to pt pharmacy. Pt adv pt to continue to monitor your bp and call with readings in a couple of weeks. Pt voiced appreciation for the call back and verbalized understanding.

## 2015-06-14 NOTE — Telephone Encounter (Signed)
New message      Pt c/o BP issue: STAT if pt c/o blurred vision, one-sided weakness or slurred speech  1. What are your last 5 BP readings? 9-16 141/97; 9-17 153/102; 9-18 138/97; 9-19- 158/86; 9-20 138/86; 9-22 150/102; 9-23 130/106; 9-25 152/102; 9-26 134/99; 9-27 136/101; 9-28 146/99; 9-29 110/80 (by doct bp cuff); 10-1-150/105 2. Are you having any other symptoms (ex. Dizziness, headache, blurred vision, passed out)?  Headaches at night 3. What is your BP issue?  Calling to give bp readings

## 2015-06-14 NOTE — Telephone Encounter (Signed)
Called to give pt Dr.Smith's recommendations. lmtcb 

## 2015-06-16 ENCOUNTER — Other Ambulatory Visit: Payer: Self-pay | Admitting: Interventional Cardiology

## 2015-06-22 ENCOUNTER — Telehealth: Payer: Self-pay | Admitting: *Deleted

## 2015-06-22 NOTE — Telephone Encounter (Signed)
Rf req for Lorazepam 1 mg 1 po bid prn # 180. Ok to Rf?

## 2015-06-23 MED ORDER — LORAZEPAM 1 MG PO TABS: 1.0000 mg | ORAL_TABLET | Freq: Two times a day (BID) | ORAL | Status: DC | PRN

## 2015-06-23 NOTE — Telephone Encounter (Signed)
Done

## 2015-06-23 NOTE — Telephone Encounter (Signed)
OK to fill this prescription with additional refills x1 Thank you!  

## 2015-07-20 ENCOUNTER — Telehealth: Payer: Self-pay | Admitting: *Deleted

## 2015-07-20 NOTE — Telephone Encounter (Signed)
It has been done (lorazepam) Thx

## 2015-07-20 NOTE — Telephone Encounter (Signed)
Left msg on triage requesting refill onher alprazolam would like quantity change back to 90 day...Johny Chess

## 2015-07-21 NOTE — Telephone Encounter (Signed)
[facility] to see if they received rx for Lorazepam on 07/03/15. She stated yes pt actually pick-up on yesterday...Johny Chess

## 2015-07-27 ENCOUNTER — Other Ambulatory Visit: Payer: Self-pay

## 2015-07-27 DIAGNOSIS — L570 Actinic keratosis: Secondary | ICD-10-CM | POA: Diagnosis not present

## 2015-07-27 DIAGNOSIS — Z1231 Encounter for screening mammogram for malignant neoplasm of breast: Secondary | ICD-10-CM

## 2015-07-27 DIAGNOSIS — L821 Other seborrheic keratosis: Secondary | ICD-10-CM | POA: Diagnosis not present

## 2015-07-27 DIAGNOSIS — D225 Melanocytic nevi of trunk: Secondary | ICD-10-CM | POA: Diagnosis not present

## 2015-07-27 DIAGNOSIS — D692 Other nonthrombocytopenic purpura: Secondary | ICD-10-CM | POA: Diagnosis not present

## 2015-07-27 DIAGNOSIS — L814 Other melanin hyperpigmentation: Secondary | ICD-10-CM | POA: Diagnosis not present

## 2015-08-11 ENCOUNTER — Telehealth: Payer: Self-pay | Admitting: Interventional Cardiology

## 2015-08-11 DIAGNOSIS — I480 Paroxysmal atrial fibrillation: Secondary | ICD-10-CM

## 2015-08-11 DIAGNOSIS — R0602 Shortness of breath: Secondary | ICD-10-CM

## 2015-08-11 NOTE — Telephone Encounter (Signed)
Called pt.  Patient stated that she has had sob 3 times in the last 6 weeks.  All three were after exertion climbing hills while walking.  Denied having any cp,dizziness,numbness, wt gain or LE swelling.  Said that her b/p and hr were normal during one of these episodes.

## 2015-08-11 NOTE — Telephone Encounter (Signed)
Pt c/o Shortness Of Breath: STAT if SOB developed within the last 24 hours or pt is noticeably SOB on the phone  1. Are you currently SOB (can you hear that pt is SOB on the phone)? no  2. How long have you been experiencing SOB? Few instances started About 6 weeks ago   3. Are you SOB when sitting or when up moving around? After walking (specifically inclines)  4. Are you currently experiencing any other symptoms? no

## 2015-08-11 NOTE — Telephone Encounter (Signed)
Please do a 48 hour Holter monitor and as the patient to be active including walking while wearing the device.

## 2015-08-11 NOTE — Telephone Encounter (Signed)
LMTCB

## 2015-08-12 NOTE — Telephone Encounter (Signed)
Placed order for holter monitor placement.  Advised patient that Dr Tamala Julian would like her to exercise during the monitoring time like she was doing during her sob instances.

## 2015-08-18 ENCOUNTER — Ambulatory Visit (INDEPENDENT_AMBULATORY_CARE_PROVIDER_SITE_OTHER): Payer: Medicare Other

## 2015-08-18 DIAGNOSIS — I480 Paroxysmal atrial fibrillation: Secondary | ICD-10-CM | POA: Diagnosis not present

## 2015-08-18 DIAGNOSIS — R0602 Shortness of breath: Secondary | ICD-10-CM

## 2015-08-26 NOTE — Telephone Encounter (Signed)
Patient given results of monitor along with Dr. Thompson Caul comments. Patient would like to know what he thinks about her shortness of breath with exertion  Copied and pasted nurse call from 12/01:  Called pt. Patient stated that she has had sob 3 times in the last 6 weeks. All three were after exertion climbing hills while walking. Denied having any cp,dizziness,numbness, wt gain or LE swelling. Said that her b/p and hr were normal during one of these episodes.   Routing to [facility] and Dr.Smith I will be gone week of 12/19 and can not follow up myself

## 2015-08-26 NOTE — Telephone Encounter (Signed)
F/u  Pt returning RN phone call- holter results. Please call back and discuss.

## 2015-08-27 NOTE — Telephone Encounter (Signed)
Repeat echo to make sure heart has not gotten weak secondary to AF. CBC and Renal profile.  Diagnosis for all is "Dyspnea"

## 2015-08-29 NOTE — Telephone Encounter (Signed)
Pt aware of Dr.Smith's recommendations below. Adv pt that a scheduler will call her to schedule her echo and same day lab appt. Pt agreeable with plan and verbalized understanding.  Message fwd to pcc to call pt to schedule

## 2015-08-31 ENCOUNTER — Ambulatory Visit
Admission: RE | Admit: 2015-08-31 | Discharge: 2015-08-31 | Disposition: A | Discharge status: Home / Self Care | Payer: Medicare Other | Source: Ambulatory Visit

## 2015-08-31 DIAGNOSIS — Z1231 Encounter for screening mammogram for malignant neoplasm of breast: Secondary | ICD-10-CM | POA: Diagnosis not present

## 2015-09-01 ENCOUNTER — Other Ambulatory Visit: Payer: Self-pay | Admitting: Interventional Cardiology

## 2015-09-07 DIAGNOSIS — H353131 Nonexudative age-related macular degeneration, bilateral, early dry stage: Secondary | ICD-10-CM | POA: Diagnosis not present

## 2015-09-13 ENCOUNTER — Encounter: Payer: Self-pay | Admitting: Gynecology

## 2015-09-13 ENCOUNTER — Ambulatory Visit (INDEPENDENT_AMBULATORY_CARE_PROVIDER_SITE_OTHER): Payer: Medicare Other | Admitting: Gynecology

## 2015-09-13 VITALS — BP 122/78

## 2015-09-13 DIAGNOSIS — R1030 Lower abdominal pain, unspecified: Secondary | ICD-10-CM

## 2015-09-13 DIAGNOSIS — L298 Other pruritus: Secondary | ICD-10-CM

## 2015-09-13 DIAGNOSIS — N898 Other specified noninflammatory disorders of vagina: Secondary | ICD-10-CM

## 2015-09-13 LAB — URINALYSIS W MICROSCOPIC + REFLEX CULTURE
Bilirubin Urine: NEGATIVE
Casts: NONE SEEN [LPF]
Crystals: NONE SEEN [HPF]
Glucose, UA: NEGATIVE
Ketones, ur: NEGATIVE
Leukocytes, UA: NEGATIVE
Nitrite: NEGATIVE
Protein, ur: NEGATIVE
Specific Gravity, Urine: 1.02 (ref 1.001–1.035)
WBC, UA: NONE SEEN WBC/HPF (ref <=5)
Yeast: NONE SEEN [HPF]
pH: 6 (ref 5.0–8.0)

## 2015-09-13 LAB — WET PREP FOR TRICH, YEAST, CLUE
Clue Cells Wet Prep HPF POC: NONE SEEN
Trich, Wet Prep: NONE SEEN
WBC, Wet Prep HPF POC: NONE SEEN
Yeast Wet Prep HPF POC: NONE SEEN

## 2015-09-13 MED ORDER — FLUCONAZOLE 150 MG PO TABS: 150.0000 mg | ORAL_TABLET | Freq: Once | ORAL | Status: DC

## 2015-09-13 NOTE — Patient Instructions (Signed)
Follow up with Dr. Earlean Shawl in reference to your abdominal pain.  Take the one Diflucan pill to hopefully get rid of the vaginal itching.

## 2015-09-13 NOTE — Addendum Note (Signed)
Addended by: Nelva Nay on: 09/13/2015 01:03 PM   Modules accepted: Orders

## 2015-09-13 NOTE — Progress Notes (Signed)
Hailey [name] 20-Mar-1935 [id number]        81 y.o.  G2P2002 Presents with 2 issues. The first is some vaginal itching the last several weeks. No discharge or odor. No frequency dysuria or urgency. Patient also notes some lower pelvic discomfort right greater than left. Does note bowel movements up to 4 times daily. Has a history of diverticulitis in the past. No nausea or vomiting, fever or chills.  Status post TVH BSO in the past  Past medical history,surgical history, problem list, medications, allergies, family history and social history were all reviewed and documented in the EPIC chart.  Directed ROS with pertinent positives and negatives documented in the history of present illness/assessment and plan.  Exam: [name] assistant Filed Vitals:   09/13/15 1215  BP: 122/78   General appearance:  Normal Abdomen soft nontender without masses guarding rebound Pelvic external BUS vagina with atrophic changes. Scant white discharge noted. Bimanual without masses or tenderness rectal exam is normal.  Assessment/Plan:  81 y.o. G2P2002 with above history and exam. Urinalysis is unremarkable/contaminated. Recommend follow up on culture. Wet prep is negative. I'm going to cover her for yeast given her history of recurrent vaginitis in the past with Diflucan 150 mg 1 dose, hold cholesterol medication day of dose.  Follow up with her gastroenterologist in reference to her abdominal pain given the history is more suggestive of GI and she is status post TVH BSO.  Patient agrees to do so. Will follow up on the urine culture.    Anastasio Auerbach MD, 12:40 PM 09/13/2015

## 2015-09-15 LAB — URINE CULTURE: Colony Count: 25000

## 2015-09-16 ENCOUNTER — Other Ambulatory Visit: Payer: Self-pay

## 2015-09-16 ENCOUNTER — Other Ambulatory Visit (INDEPENDENT_AMBULATORY_CARE_PROVIDER_SITE_OTHER): Payer: Medicare Other | Admitting: *Deleted

## 2015-09-16 ENCOUNTER — Ambulatory Visit (HOSPITAL_COMMUNITY): Payer: Medicare Other | Attending: Cardiovascular Disease

## 2015-09-16 DIAGNOSIS — I071 Rheumatic tricuspid insufficiency: Secondary | ICD-10-CM | POA: Insufficient documentation

## 2015-09-16 DIAGNOSIS — I340 Nonrheumatic mitral (valve) insufficiency: Secondary | ICD-10-CM | POA: Insufficient documentation

## 2015-09-16 DIAGNOSIS — E669 Obesity, unspecified: Secondary | ICD-10-CM | POA: Insufficient documentation

## 2015-09-16 DIAGNOSIS — R0602 Shortness of breath: Secondary | ICD-10-CM | POA: Diagnosis not present

## 2015-09-16 DIAGNOSIS — I10 Essential (primary) hypertension: Secondary | ICD-10-CM | POA: Insufficient documentation

## 2015-09-16 DIAGNOSIS — Z6829 Body mass index (BMI) 29.0-29.9, adult: Secondary | ICD-10-CM | POA: Insufficient documentation

## 2015-09-16 DIAGNOSIS — I517 Cardiomegaly: Secondary | ICD-10-CM | POA: Insufficient documentation

## 2015-09-16 DIAGNOSIS — I4891 Unspecified atrial fibrillation: Secondary | ICD-10-CM | POA: Insufficient documentation

## 2015-09-16 DIAGNOSIS — I480 Paroxysmal atrial fibrillation: Secondary | ICD-10-CM

## 2015-09-16 LAB — BASIC METABOLIC PANEL
BUN: 26 mg/dL — ABNORMAL HIGH (ref 7–25)
CO2: 23 mmol/L (ref 20–31)
Calcium: 9.1 mg/dL (ref 8.6–10.4)
Chloride: 102 mmol/L (ref 98–110)
Creat: 1.25 mg/dL — ABNORMAL HIGH (ref 0.60–0.93)
Glucose, Bld: 83 mg/dL (ref 65–99)
Potassium: 4.1 mmol/L (ref 3.5–5.3)
Sodium: 137 mmol/L (ref 135–146)

## 2015-09-16 LAB — CBC WITH DIFFERENTIAL/PLATELET
Basophils Absolute: 0 10*3/uL (ref 0.0–0.1)
Basophils Relative: 0 % (ref 0–1)
Eosinophils Absolute: 0.3 10*3/uL (ref 0.0–0.7)
Eosinophils Relative: 4 % (ref 0–5)
HCT: 37.1 % (ref 36.0–46.0)
Hemoglobin: 11.9 g/dL — ABNORMAL LOW (ref 12.0–15.0)
Lymphocytes Relative: 21 % (ref 12–46)
Lymphs Abs: 1.4 10*3/uL (ref 0.7–4.0)
MCH: 27.6 pg (ref 26.0–34.0)
MCHC: 32.1 g/dL (ref 30.0–36.0)
MCV: 86.1 fL (ref 78.0–100.0)
MPV: 10.6 fL (ref 8.6–12.4)
Monocytes Absolute: 0.8 10*3/uL (ref 0.1–1.0)
Monocytes Relative: 11 % (ref 3–12)
Neutro Abs: 4.4 10*3/uL (ref 1.7–7.7)
Neutrophils Relative %: 64 % (ref 43–77)
Platelets: 277 10*3/uL (ref 150–400)
RBC: 4.31 MIL/uL (ref 3.87–5.11)
RDW: 17.8 % — ABNORMAL HIGH (ref 11.5–15.5)
WBC: 6.9 10*3/uL (ref 4.0–10.5)

## 2015-09-20 ENCOUNTER — Telehealth: Payer: Self-pay

## 2015-09-20 MED ORDER — METOPROLOL SUCCINATE ER 50 MG PO TB24: 50.0000 mg | ORAL_TABLET | Freq: Two times a day (BID) | ORAL | Status: DC

## 2015-09-20 NOTE — Telephone Encounter (Signed)
-----   Message from Belva Crome, MD sent at 09/17/2015  3:46 PM EST ----- The echo reveals normal heart strength.  Increase metoprolol from 75 to 100 mg daily (50 mg BID)  Needs ov to discuss.

## 2015-09-20 NOTE — Telephone Encounter (Signed)
Pt aware of echo results. Rx sent to pt pharmacy for Metoprolol succ 50mg  bid. F/u apppt scheduled for 3/9 @ 12pm. Pt voiced appreciation and verbalized understanding.

## 2015-09-28 ENCOUNTER — Ambulatory Visit (INDEPENDENT_AMBULATORY_CARE_PROVIDER_SITE_OTHER): Payer: Medicare Other | Admitting: Gynecology

## 2015-09-28 ENCOUNTER — Encounter: Payer: Self-pay | Admitting: Gynecology

## 2015-09-28 VITALS — BP 120/76 | Ht 65.0 in

## 2015-09-28 DIAGNOSIS — M8580 Other specified disorders of bone density and structure, unspecified site: Secondary | ICD-10-CM | POA: Diagnosis not present

## 2015-09-28 DIAGNOSIS — N952 Postmenopausal atrophic vaginitis: Secondary | ICD-10-CM

## 2015-09-28 DIAGNOSIS — Z01419 Encounter for gynecological examination (general) (routine) without abnormal findings: Secondary | ICD-10-CM | POA: Diagnosis not present

## 2015-09-28 DIAGNOSIS — R8279 Other abnormal findings on microbiological examination of urine: Secondary | ICD-10-CM | POA: Diagnosis not present

## 2015-09-28 DIAGNOSIS — Z79890 Hormone replacement therapy (postmenopausal): Secondary | ICD-10-CM | POA: Diagnosis not present

## 2015-09-28 NOTE — Progress Notes (Signed)
Hailey [name] Nov 02, 1934 [id number]        80 y.o.  G2P2002  for breast and pelvic exam. Several issues noted below.  Past medical history,surgical history, problem list, medications, allergies, family history and social history were all reviewed and documented as reviewed in the EPIC chart.  ROS:  Performed with pertinent positives and negatives included in the history, assessment and plan.   Additional significant findings :  none   Exam: [name] assistant Filed Vitals:   09/28/15 1415  BP: 120/76  Height: 5\' 5"  (1.651 m)   General appearance:  Normal affect, orientation and appearance. Skin: Grossly normal HEENT: Without gross lesions.  No cervical or supraclavicular adenopathy. Thyroid normal.  Lungs:  Clear without wheezing, rales or rhonchi Cardiac: RR, without RMG Abdominal:  Soft, nontender, without masses, guarding, rebound, organomegaly or hernia Breasts:  Examined lying and sitting without masses, retractions, discharge or axillary adenopathy.  Bilateral reduction scars Pelvic:  Ext/BUS/vagina with atrophic changes  Adnexa  Without masses or tenderness    Anus and perineum  Normal   Rectovaginal  Normal sphincter tone without palpated masses or tenderness.    Assessment/Plan:  81 y.o. [id number] female for breast and pelvic exam.   1. Postmenopausal/atrophic genital changes/HRT. Status post TVH BSO.  Patient continues on estradiol 0.0375 patch. I again reviewed the whole issue of HRT, WHI study, being on an anticoagulant with A. Fib with increased risks of stroke heart attack DVT possible breast cancer. After lengthy discussion she's going to try to wean. She will start with one half a patch twice weekly for several weeks and then stopping and see how she does. If she does well with this then will stay off of it.  If she has any issues she will call me. 2. Osteopenia. DEXA 12/2011 T score -1.3 FRAX 10%/1.9%. We'll plan repeat DEXA next year after being off of her ERT by  1 year. Increased calcium vitamin D reviewed. 3. History of lichen sclerosus. Occasional Temovate 0.05% cream use.  Will call when she needs more. 4. Pap smear 2011. No Pap smear done today. No history of abnormal Pap smears. Over the age of 35. Status post hysterectomy for benign indications.  Per current screening guidelines we both agreed to stop screening and she is comfortable with this. 5. Colonoscopy 2013. Repeat at their recommended interval. 6. Mammography 08/2015. Continue with annual mammography when due. SBE monthly reviewed. 7. Health maintenance. No routine blood work done as this is done at her primary physician's office. Follow up 1 year, sooner as needed.   Anastasio Auerbach MD, 2:36 PM 09/28/2015

## 2015-09-28 NOTE — Patient Instructions (Signed)
Wean off of the estrogen patches as we discussed. Call me if you have any issues with this.

## 2015-09-29 LAB — URINALYSIS W MICROSCOPIC + REFLEX CULTURE
Bacteria, UA: NONE SEEN [HPF]
Bilirubin Urine: NEGATIVE
Casts: NONE SEEN [LPF]
Crystals: NONE SEEN [HPF]
Glucose, UA: NEGATIVE
Hgb urine dipstick: NEGATIVE
Ketones, ur: NEGATIVE
Leukocytes, UA: NEGATIVE
Nitrite: NEGATIVE
Protein, ur: NEGATIVE
Specific Gravity, Urine: 1.018 (ref 1.001–1.035)
WBC, UA: NONE SEEN WBC/HPF (ref <=5)
Yeast: NONE SEEN [HPF]
pH: 6.5 (ref 5.0–8.0)

## 2015-10-02 LAB — URINE CULTURE: Colony Count: 40000

## 2015-10-03 ENCOUNTER — Other Ambulatory Visit: Payer: Self-pay | Admitting: Gynecology

## 2015-10-03 MED ORDER — CIPROFLOXACIN HCL 250 MG PO TABS: 250.0000 mg | ORAL_TABLET | Freq: Two times a day (BID) | ORAL | Status: DC

## 2015-10-10 ENCOUNTER — Encounter: Payer: Self-pay | Admitting: Internal Medicine

## 2015-10-10 ENCOUNTER — Ambulatory Visit (INDEPENDENT_AMBULATORY_CARE_PROVIDER_SITE_OTHER): Payer: Medicare Other | Admitting: Internal Medicine

## 2015-10-10 VITALS — BP 130/64 | HR 83 | Wt 174.0 lb

## 2015-10-10 DIAGNOSIS — Z0000 Encounter for general adult medical examination without abnormal findings: Secondary | ICD-10-CM

## 2015-10-10 DIAGNOSIS — I2510 Atherosclerotic heart disease of native coronary artery without angina pectoris: Secondary | ICD-10-CM | POA: Diagnosis not present

## 2015-10-10 DIAGNOSIS — K219 Gastro-esophageal reflux disease without esophagitis: Secondary | ICD-10-CM

## 2015-10-10 DIAGNOSIS — R110 Nausea: Secondary | ICD-10-CM | POA: Diagnosis not present

## 2015-10-10 MED ORDER — DULOXETINE HCL 30 MG PO CPEP: 30.0000 mg | ORAL_CAPSULE | Freq: Every day | ORAL | Status: DC

## 2015-10-10 NOTE — Progress Notes (Signed)
Pre visit review using our clinic review tool, if applicable. No additional management support is needed unless otherwise documented below in the visit note. 
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
## 2015-10-10 NOTE — Assessment & Plan Note (Addendum)
Nexium 

## 2015-10-10 NOTE — Assessment & Plan Note (Signed)
1/17 abd [place]

## 2015-10-10 NOTE — Patient Instructions (Addendum)
Hailey [name] , Thank you for taking time to come for your Medicare Wellness Visit. I appreciate your ongoing commitment to your health goals. Please review the following plan we discussed and let me know if I can assist you in the future.   May have hearing check at some point  Will take Pneumovax 23 when convenient;   These are the goals we discussed: Goals    . Weight < 158 lb (71.668 kg)     Would like to lose weight; do a little more exercise;  Eat a balanced meals; eats fruit        This is a list of the screening recommended for you and due dates:  Health Maintenance  Topic Date Due  . Pap Smear  12/09/2011  . Pneumonia vaccines (2 of 2 - PPSV23) 01/26/2015  . Flu Shot  04/10/2016  . Tetanus Vaccine  06/16/2017  . DEXA scan (bone density measurement)  Completed  . Shingles Vaccine  Completed     Fall Prevention in the Home  Falls can cause injuries. They can happen to people of all ages. There are many things you can do to make your home safe and to help prevent falls.  WHAT CAN I DO ON THE OUTSIDE OF MY HOME?  Regularly fix the edges of walkways and driveways and fix any cracks.  Remove anything that might make you trip as you walk through a door, such as a raised step or threshold.  Trim any bushes or trees on the path to your home.  Use bright outdoor lighting.  Clear any walking paths of anything that might make someone trip, such as rocks or tools.  Regularly check to see if handrails are loose or broken. Make sure that both sides of any steps have handrails.  Any raised decks and porches should have guardrails on the edges.  Have any leaves, snow, or ice cleared regularly.  Use sand or salt on walking paths during winter.  Clean up any spills in your garage right away. This includes oil or grease spills. WHAT CAN I DO IN THE BATHROOM?   Use night lights.  Install grab bars by the toilet and in the tub and shower. Do not use towel bars as grab  bars.  Use non-skid mats or decals in the tub or shower.  If you need to sit down in the shower, use a plastic, non-slip stool.  Keep the floor dry. Clean up any water that spills on the floor as soon as it happens.  Remove soap buildup in the tub or shower regularly.  Attach bath mats securely with double-sided non-slip rug tape.  Do not have throw rugs and other things on the floor that can make you trip. WHAT CAN I DO IN THE BEDROOM?  Use night lights.  Make sure that you have a light by your bed that is easy to reach.  Do not use any sheets or blankets that are too big for your bed. They should not hang down onto the floor.  Have a firm chair that has side arms. You can use this for support while you get dressed.  Do not have throw rugs and other things on the floor that can make you trip. WHAT CAN I DO IN THE KITCHEN?  Clean up any spills right away.  Avoid walking on wet floors.  Keep items that you use a lot in easy-to-reach places.  If you need to reach something above you, use a strong  step stool that has a grab bar.  Keep electrical cords out of the way.  Do not use floor polish or wax that makes floors slippery. If you must use wax, use non-skid floor wax.  Do not have throw rugs and other things on the floor that can make you trip. WHAT CAN I DO WITH MY STAIRS?  Do not leave any items on the stairs.  Make sure that there are handrails on both sides of the stairs and use them. Fix handrails that are broken or loose. Make sure that handrails are as long as the stairways.  Check any carpeting to make sure that it is firmly attached to the stairs. Fix any carpet that is loose or worn.  Avoid having throw rugs at the top or bottom of the stairs. If you do have throw rugs, attach them to the floor with carpet tape.  Make sure that you have a light switch at the top of the stairs and the bottom of the stairs. If you do not have them, ask someone to add them for  you. WHAT ELSE CAN I DO TO HELP PREVENT FALLS?  Wear shoes that:  Do not have high heels.  Have rubber bottoms.  Are comfortable and fit you well.  Are closed at the toe. Do not wear sandals.  If you use a stepladder:  Make sure that it is fully opened. Do not climb a closed stepladder.  Make sure that both sides of the stepladder are locked into place.  Ask someone to hold it for you, if possible.  Clearly mark and make sure that you can see:  Any grab bars or handrails.  First and last steps.  Where the edge of each step is.  Use tools that help you move around (mobility aids) if they are needed. These include:  Canes.  Walkers.  Scooters.  Crutches.  Turn on the lights when you go into a dark area. Replace any light bulbs as soon as they burn out.  Set up your furniture so you have a clear path. Avoid moving your furniture around.  If any of your floors are uneven, fix them.  If there are any pets around you, be aware of where they are.  Review your medicines with your doctor. Some medicines can make you feel dizzy. This can increase your chance of falling. Ask your doctor what other things that you can do to help prevent falls.   This information is not intended to replace advice given to you by your health care provider. Make sure you discuss any questions you have with your health care provider.   Document Released: 06/23/2009 Document Revised: 01/11/2015 Document Reviewed: 10/01/2014 Elsevier Interactive Patient Education 2016 [employer]  Health Maintenance, Female Adopting a healthy lifestyle and getting preventive care can go a long way to promote health and wellness. Talk with your health care provider about what schedule of regular examinations is right for you. This is a good chance for you to check in with your provider about disease prevention and staying healthy. In between checkups, there are plenty of things you can do on your own. Experts  have done a lot of research about which lifestyle changes and preventive measures are most likely to keep you healthy. Ask your health care provider for more information. WEIGHT AND DIET  Eat a healthy diet  Be sure to include plenty of vegetables, fruits, low-fat dairy products, and lean protein.  Do not eat a lot of   foods high in solid fats, added sugars, or salt.  Get regular exercise. This is one of the most important things you can do for your health.  Most adults should exercise for at least 150 minutes each week. The exercise should increase your heart rate and make you sweat (moderate-intensity exercise).  Most adults should also do strengthening exercises at least twice a week. This is in addition to the moderate-intensity exercise.  Maintain a healthy weight  Body mass index (BMI) is a measurement that can be used to identify possible weight problems. It estimates body fat based on height and weight. Your health care provider can help determine your BMI and help you achieve or maintain a healthy weight.  For females 20 years of age and older:   A BMI below 18.5 is considered underweight.  A BMI of 18.5 to 24.9 is normal.  A BMI of 25 to 29.9 is considered overweight.  A BMI of 30 and above is considered obese.  Watch levels of cholesterol and blood lipids  You should start having your blood tested for lipids and cholesterol at 79 years of age, then have this test every 5 years.  You may need to have your cholesterol levels checked more often if:  Your lipid or cholesterol levels are high.  You are older than 79 years of age.  You are at high risk for heart disease.  CANCER SCREENING   Lung Cancer  Lung cancer screening is recommended for adults 55-80 years old who are at high risk for lung cancer because of a history of smoking.  A yearly low-dose CT scan of the lungs is recommended for people who:  Currently smoke.  Have quit within the past 15  years.  Have at least a 30-pack-year history of smoking. A pack year is smoking an average of one pack of cigarettes a day for 1 year.  Yearly screening should continue until it has been 15 years since you quit.  Yearly screening should stop if you develop a health problem that would prevent you from having lung cancer treatment.  Breast Cancer  Practice breast self-awareness. This means understanding how your breasts normally appear and feel.  It also means doing regular breast self-exams. Let your health care provider know about any changes, no matter how small.  If you are in your 20s or 30s, you should have a clinical breast exam (CBE) by a health care provider every 1-3 years as part of a regular health exam.  If you are 40 or older, have a CBE every year. Also consider having a breast X-ray (mammogram) every year.  If you have a family history of breast cancer, talk to your health care provider about genetic screening.  If you are at high risk for breast cancer, talk to your health care provider about having an MRI and a mammogram every year.  Breast cancer gene (BRCA) assessment is recommended for women who have family members with BRCA-related cancers. BRCA-related cancers include:  Breast.  Ovarian.  Tubal.  Peritoneal cancers.  Results of the assessment will determine the need for genetic counseling and BRCA1 and BRCA2 testing. Cervical Cancer Your health care provider may recommend that you be screened regularly for cancer of the pelvic organs (ovaries, uterus, and vagina). This screening involves a pelvic examination, including checking for microscopic changes to the surface of your cervix (Pap test). You may be encouraged to have this screening done every 3 years, beginning at age 21.  For women   ages 30-65, health care providers may recommend pelvic exams and Pap testing every 3 years, or they may recommend the Pap and pelvic exam, combined with testing for human  papilloma virus (HPV), every 5 years. Some types of HPV increase your risk of cervical cancer. Testing for HPV may also be done on women of any age with unclear Pap test results.  Other health care providers may not recommend any screening for nonpregnant women who are considered low risk for pelvic cancer and who do not have symptoms. Ask your health care provider if a screening pelvic exam is right for you.  If you have had past treatment for cervical cancer or a condition that could lead to cancer, you need Pap tests and screening for cancer for at least 20 years after your treatment. If Pap tests have been discontinued, your risk factors (such as having a new sexual partner) need to be reassessed to determine if screening should resume. Some women have medical problems that increase the chance of getting cervical cancer. In these cases, your health care provider may recommend more frequent screening and Pap tests. Colorectal Cancer  This type of cancer can be detected and often prevented.  Routine colorectal cancer screening usually begins at 79 years of age and continues through 79 years of age.  Your health care provider may recommend screening at an earlier age if you have risk factors for colon cancer.  Your health care provider may also recommend using home test kits to check for hidden blood in the stool.  A small camera at the end of a tube can be used to examine your colon directly (sigmoidoscopy or colonoscopy). This is done to check for the earliest forms of colorectal cancer.  Routine screening usually begins at age 50.  Direct examination of the colon should be repeated every 5-10 years through 79 years of age. However, you may need to be screened more often if early forms of precancerous polyps or small growths are found. Skin Cancer  Check your skin from head to toe regularly.  Tell your health care provider about any new moles or changes in moles, especially if there is a  change in a mole's shape or color.  Also tell your health care provider if you have a mole that is larger than the size of a pencil eraser.  Always use sunscreen. Apply sunscreen liberally and repeatedly throughout the day.  Protect yourself by wearing long sleeves, pants, a wide-brimmed hat, and sunglasses whenever you are outside. HEART DISEASE, DIABETES, AND HIGH BLOOD PRESSURE   High blood pressure causes heart disease and increases the risk of stroke. High blood pressure is more likely to develop in:  People who have blood pressure in the high end of the normal range (130-139/85-89 mm Hg).  People who are overweight or obese.  People who are African American.  If you are 18-39 years of age, have your blood pressure checked every 3-5 years. If you are 40 years of age or older, have your blood pressure checked every year. You should have your blood pressure measured twice--once when you are at a hospital or clinic, and once when you are not at a hospital or clinic. Record the average of the two measurements. To check your blood pressure when you are not at a hospital or clinic, you can use:  An automated blood pressure machine at a pharmacy.  A home blood pressure monitor.  If you are between 55 years and 79 years old,   ask your health care provider if you should take aspirin to prevent strokes.  Have regular diabetes screenings. This involves taking a blood sample to check your fasting blood sugar level.  If you are at a normal weight and have a low risk for diabetes, have this test once every three years after 79 years of age.  If you are overweight and have a high risk for diabetes, consider being tested at a younger age or more often. PREVENTING INFECTION  Hepatitis B  If you have a higher risk for hepatitis B, you should be screened for this virus. You are considered at high risk for hepatitis B if:  You were born in a country where hepatitis B is common. Ask your health  care provider which countries are considered high risk.  Your parents were born in a high-risk country, and you have not been immunized against hepatitis B (hepatitis B vaccine).  You have HIV or AIDS.  You use needles to inject street drugs.  You live with someone who has hepatitis B.  You have had sex with someone who has hepatitis B.  You get hemodialysis treatment.  You take certain medicines for conditions, including cancer, organ transplantation, and autoimmune conditions. Hepatitis C  Blood testing is recommended for:  Everyone born from 1945 through 1965.  Anyone with known risk factors for hepatitis C. Sexually transmitted infections (STIs)  You should be screened for sexually transmitted infections (STIs) including gonorrhea and chlamydia if:  You are sexually active and are younger than 79 years of age.  You are older than 79 years of age and your health care provider tells you that you are at risk for this type of infection.  Your sexual activity has changed since you were last screened and you are at an increased risk for chlamydia or gonorrhea. Ask your health care provider if you are at risk.  If you do not have HIV, but are at risk, it may be recommended that you take a prescription medicine daily to prevent HIV infection. This is called pre-exposure prophylaxis (PrEP). You are considered at risk if:  You are sexually active and do not regularly use condoms or know the HIV status of your partner(s).  You take drugs by injection.  You are sexually active with a partner who has HIV. Talk with your health care provider about whether you are at high risk of being infected with HIV. If you choose to begin PrEP, you should first be tested for HIV. You should then be tested every 3 months for as long as you are taking PrEP.  PREGNANCY   If you are premenopausal and you may become pregnant, ask your health care provider about preconception counseling.  If you may  become pregnant, take 400 to 800 micrograms (mcg) of folic acid every day.  If you want to prevent pregnancy, talk to your health care provider about birth control (contraception). OSTEOPOROSIS AND MENOPAUSE   Osteoporosis is a disease in which the bones lose minerals and strength with aging. This can result in serious bone fractures. Your risk for osteoporosis can be identified using a bone density scan.  If you are 65 years of age or older, or if you are at risk for osteoporosis and fractures, ask your health care provider if you should be screened.  Ask your health care provider whether you should take a calcium or vitamin D supplement to lower your risk for osteoporosis.  Menopause may have certain physical symptoms and risks.    Hormone replacement therapy may reduce some of these symptoms and risks. Talk to your health care provider about whether hormone replacement therapy is right for you.  HOME CARE INSTRUCTIONS   Schedule regular health, dental, and eye exams.  Stay current with your immunizations.   Do not use any tobacco products including cigarettes, chewing tobacco, or electronic cigarettes.  If you are pregnant, do not drink alcohol.  If you are breastfeeding, limit how much and how often you drink alcohol.  Limit alcohol intake to no more than 1 drink per day for nonpregnant women. One drink equals 12 ounces of beer, 5 ounces of wine, or 1 ounces of hard liquor.  Do not use street drugs.  Do not share needles.  Ask your health care provider for help if you need support or information about quitting drugs.  Tell your health care provider if you often feel depressed.  Tell your health care provider if you have ever been abused or do not feel safe at home.   This information is not intended to replace advice given to you by your health care provider. Make sure you discuss any questions you have with your health care provider.   Document Released: 03/12/2011  Document Revised: 09/17/2014 Document Reviewed: 07/29/2013 Elsevier Interactive Patient Education 2016 Else[employer] Inc.  

## 2015-10-10 NOTE — Assessment & Plan Note (Addendum)
On Pravastatin, Eliquis, Metoprolol 

## 2015-10-10 NOTE — Progress Notes (Signed)
Subjective:  Patient ID: Hailey [name], female    DOB: 1935/01/22  Age: 81 y.o. MRN: [id number]  CC: Medicare Wellness   HPI Hailey [name] presents for HTN, A fib, anxiety f/u. Brother died of pancreatic cancer. C/o nausea abd pain recent onset  Outpatient Prescriptions Prior to Visit  Medication Sig Dispense Refill  . cetirizine (ZYRTEC) 10 MG tablet Take 10 mg by mouth daily as needed.     . clobetasol cream (TEMOVATE) 0.05 % APPLY NIGHTLY AS DIRECTED. 15 g 1  . ELIQUIS 5 MG TABS tablet TAKE 1 TABLET TWICE DAILY. 180 tablet 2  . esomeprazole (NEXIUM) 40 MG capsule Take 40 mg by mouth daily before breakfast.      . estradiol (CLIMARA - DOSED IN MG/24 HR) 0.0375 mg/24hr patch APPLY 1 PATCH ONCE WEEKLY AS DIRECTED. 4 patch 12  . fluconazole (DIFLUCAN) 150 MG tablet Take 1 tablet (150 mg total) by mouth once. 1 tablet 0  . LORazepam (ATIVAN) 1 MG tablet Take 1 tablet (1 mg total) by mouth 2 (two) times daily as needed for anxiety or sleep. 180 tablet 1  . metoprolol succinate (TOPROL-XL) 50 MG 24 hr tablet Take 1 tablet (50 mg total) by mouth 2 (two) times daily. 60 tablet 11  . pravastatin (PRAVACHOL) 20 MG tablet Take 1 tablet (20 mg total) by mouth every Monday, Wednesday, and Friday. 45 tablet 3  . ciprofloxacin (CIPRO) 250 MG tablet Take 1 tablet (250 mg total) by mouth 2 (two) times daily. 6 tablet 0  . DULoxetine (CYMBALTA) 60 MG capsule TAKE (1) CAPSULE DAILY. 90 capsule 3   No facility-administered medications prior to visit.    ROS Review of Systems  Constitutional: Negative for chills, activity change, appetite change, fatigue and unexpected weight change.  HENT: Negative for congestion, mouth sores and sinus pressure.   Eyes: Negative for visual disturbance.  Respiratory: Negative for cough and chest tightness.   Gastrointestinal: Positive for diarrhea. Negative for nausea and abdominal pain.  Genitourinary: Negative for frequency, difficulty urinating and vaginal  pain.  Musculoskeletal: Negative for back pain and gait problem.  Skin: Negative for pallor and rash.  Neurological: Negative for dizziness, tremors, weakness, numbness and headaches.  Psychiatric/Behavioral: Negative for confusion and sleep disturbance. The patient is nervous/anxious.     Objective:  BP 130/64 mmHg  Pulse 83  Wt 174 lb (78.926 kg)  SpO2 93%  BP Readings from Last 3 Encounters:  10/10/15 130/64  09/28/15 120/76  09/13/15 122/78    Wt Readings from Last 3 Encounters:  10/10/15 174 lb (78.926 kg)  06/09/15 173 lb (78.472 kg)  05/25/15 174 lb (78.926 kg)    Physical Exam  Constitutional: She appears well-developed. No distress.  HENT:  Head: Normocephalic.  Right Ear: External ear normal.  Left Ear: External ear normal.  Nose: Nose normal.  Mouth/Throat: Oropharynx is clear and moist.  Eyes: Conjunctivae are normal. Pupils are equal, round, and reactive to light. Right eye exhibits no discharge. Left eye exhibits no discharge.  Neck: Normal range of motion. Neck supple. No JVD present. No tracheal deviation present. No thyromegaly present.  Cardiovascular: Normal rate, regular rhythm and normal heart sounds.   Pulmonary/Chest: No stridor. No respiratory distress. She has no wheezes.  Abdominal: Soft. Bowel sounds are normal. She exhibits no distension and no mass. There is no tenderness. There is no rebound and no guarding.  Musculoskeletal: She exhibits no edema or tenderness.  Lymphadenopathy:    She has  no cervical adenopathy.  Neurological: She displays normal reflexes. No cranial nerve deficit. She exhibits normal muscle tone. Coordination normal.  Skin: No rash noted. No erythema.  Psychiatric: She has a normal mood and affect. Her behavior is normal. Judgment and thought content normal.    Lab Results  Component Value Date   WBC 6.9 09/16/2015   HGB 11.9* 09/16/2015   HCT 37.1 09/16/2015   PLT 277 09/16/2015   GLUCOSE 83 09/16/2015   CHOL 153  07/04/2014   TRIG 97 07/04/2014   HDL 69 07/04/2014   LDLDIRECT 89.1 06/29/2013   LDLCALC 65 07/04/2014   ALT 16 04/29/2015   AST 24 04/29/2015   NA 137 09/16/2015   K 4.1 09/16/2015   CL 102 09/16/2015   CREATININE 1.25* 09/16/2015   BUN 26* 09/16/2015   CO2 23 09/16/2015   TSH 2.11 04/29/2015   INR 1.27 07/04/2014   HGBA1C 5.6 07/04/2014    Mm Screening Breast Tomo Bilateral  09/02/2015  CLINICAL DATA:  Screening. EXAM: DIGITAL SCREENING BILATERAL MAMMOGRAM WITH 3D TOMO WITH CAD COMPARISON:  Previous exam(s). ACR Breast Density Category c: The breast tissue is heterogeneously dense, which may obscure small masses. FINDINGS: There are no findings suspicious for malignancy. Images were processed with CAD. IMPRESSION: No mammographic evidence of malignancy. A result letter of this screening mammogram will be mailed directly to the patient. RECOMMENDATION: Screening mammogram in one year. (Code:[id number]-B-01Y) BI-RADS CATEGORY  1: Negative. Electronically Signed   By: Lajean Manes M.D.   On: 09/02/2015 09:03    Assessment & Plan:   Laurin was seen today for medicare wellness.  Diagnoses and all orders for this visit:  Coronary artery disease involving native coronary artery of native heart without angina pectoris  Gastroesophageal reflux disease, esophagitis presence not specified -     US Abdomen Complete  Nausea without vomiting -     US Abdomen Complete  Routine history and physical examination of adult  Other orders -     DULoxetine (CYMBALTA) 30 MG capsule; Take 1 capsule (30 mg total) by mouth daily.   I have discontinued Ms. Kessen's DULoxetine and ciprofloxacin. I am also having her start on DULoxetine. Additionally, I am having her maintain her esomeprazole, cetirizine, estradiol, clobetasol cream, pravastatin, LORazepam, ELIQUIS, fluconazole, and metoprolol succinate.  Meds ordered this encounter  Medications  . DULoxetine (CYMBALTA) 30 MG capsule    Sig: Take 1  capsule (30 mg total) by mouth daily.    Dispense:  30 capsule    Refill:  2     Follow-up: Return in about 3 months (around 01/08/2016) for a follow-up visit.  Walker Kehr, MD

## 2015-11-14 ENCOUNTER — Encounter: Payer: Self-pay | Admitting: Gynecology

## 2015-11-14 ENCOUNTER — Ambulatory Visit (INDEPENDENT_AMBULATORY_CARE_PROVIDER_SITE_OTHER): Payer: Medicare Other | Admitting: Gynecology

## 2015-11-14 VITALS — BP 122/76

## 2015-11-14 DIAGNOSIS — N898 Other specified noninflammatory disorders of vagina: Secondary | ICD-10-CM

## 2015-11-14 DIAGNOSIS — N3000 Acute cystitis without hematuria: Secondary | ICD-10-CM | POA: Diagnosis not present

## 2015-11-14 DIAGNOSIS — R300 Dysuria: Secondary | ICD-10-CM

## 2015-11-14 LAB — URINALYSIS W MICROSCOPIC + REFLEX CULTURE
Bilirubin Urine: NEGATIVE
Casts: NONE SEEN [LPF]
Crystals: NONE SEEN [HPF]
Glucose, UA: NEGATIVE
Ketones, ur: NEGATIVE
Nitrite: NEGATIVE
Specific Gravity, Urine: 1.015 (ref 1.001–1.035)
Yeast: NONE SEEN [HPF]
pH: 6 (ref 5.0–8.0)

## 2015-11-14 LAB — WET PREP FOR TRICH, YEAST, CLUE
Clue Cells Wet Prep HPF POC: NONE SEEN
Trich, Wet Prep: NONE SEEN
Yeast Wet Prep HPF POC: NONE SEEN

## 2015-11-14 MED ORDER — NITROFURANTOIN MONOHYD MACRO 100 MG PO CAPS: 100.0000 mg | ORAL_CAPSULE | Freq: Two times a day (BID) | ORAL | Status: DC

## 2015-11-14 MED ORDER — TERCONAZOLE 0.4 % VA CREA: 1.0000 | TOPICAL_CREAM | Freq: Every day | VAGINAL | Status: DC

## 2015-11-14 NOTE — Patient Instructions (Signed)
Take the antibiotic pill twice daily for 7 days Use the vaginal yeast cream both inside and outside at bedtime for 7 nights  Follow up if symptoms persist, worsen or recur.

## 2015-11-14 NOTE — Progress Notes (Signed)
    Hailey [name] 02/19/1935 [id number]        80 y.o.  G2P2002 [place] vaginal itching and burning as well as burning with urination and suprapubic discomfort. No fever or chills. No low back pain. Some urgency and frequency. Has a history of lichen sclerosis and has been using her Temovate nightly but this does not seem to help with the vulvar irritation.  Past medical history,surgical history, problem list, medications, allergies, family history and social history were all reviewed and documented in the EPIC chart.  Directed ROS with pertinent positives and negatives documented in the history of present illness/assessment and plan.  Exam: [name] assistant Filed Vitals:   11/14/15 1211  BP: 122/76   General appearance:  Normal Spine without CVA tenderness Abdomen nontender without masses guarding rebound Pelvic external BUS vagina with atrophic changes. Generalized erythematous vulvitis with no specific lesions.  Vagina without significant discharge. Bimanual without masses or tenderness  Assessment/Plan:  81 y.o. [id number] with above history and exam. Urinalysis consistent with UTI with Rita 60 WBC, 40-60 RBC and many bacteria. We'll treat with Macrobid 100 mg twice a day 7 days. Wet prep was negative. Generalized vulvitis noted. Suspect yeast given recent wet environment with bathing suits and heat on vacation. Will cover with Terazol 7 day cream nightly 7 days. Follow up if symptoms persist, worsen or recur.    Anastasio Auerbach MD, 12:34 PM 11/14/2015

## 2015-11-15 ENCOUNTER — Other Ambulatory Visit: Payer: Medicare Other

## 2015-11-15 ENCOUNTER — Telehealth: Payer: Self-pay | Admitting: *Deleted

## 2015-11-15 MED ORDER — CIPROFLOXACIN HCL 250 MG PO TABS: 250.0000 mg | ORAL_TABLET | Freq: Two times a day (BID) | ORAL | Status: DC

## 2015-11-15 NOTE — Telephone Encounter (Signed)
Pt was seen yesterday for UTI Rx for Macrobid 100 mg x 7 days twice daily was prescribed, pt said she took 1 pill and felt nauseated, fatigue, stomach cramps, she took 2nd pill with dinner and felt all the above with a splitting headache. Pt believes could be reaction to medication, states she didn't feel this prior to taking medication. Please advise

## 2015-11-15 NOTE — Telephone Encounter (Signed)
Pt informed Rx sent. 

## 2015-11-15 NOTE — Telephone Encounter (Signed)
Recommend discontinuing and starting ciprofloxacin 250 mg twice a day 5 days

## 2015-11-17 ENCOUNTER — Ambulatory Visit (INDEPENDENT_AMBULATORY_CARE_PROVIDER_SITE_OTHER): Payer: Medicare Other | Admitting: Interventional Cardiology

## 2015-11-17 ENCOUNTER — Encounter: Payer: Self-pay | Admitting: Interventional Cardiology

## 2015-11-17 VITALS — BP 118/76 | HR 54 | Ht 64.5 in | Wt 173.4 lb

## 2015-11-17 DIAGNOSIS — I480 Paroxysmal atrial fibrillation: Secondary | ICD-10-CM | POA: Diagnosis not present

## 2015-11-17 DIAGNOSIS — I2510 Atherosclerotic heart disease of native coronary artery without angina pectoris: Secondary | ICD-10-CM | POA: Diagnosis not present

## 2015-11-17 DIAGNOSIS — D681 Hereditary factor XI deficiency: Secondary | ICD-10-CM

## 2015-11-17 DIAGNOSIS — I4891 Unspecified atrial fibrillation: Secondary | ICD-10-CM

## 2015-11-17 DIAGNOSIS — Z7901 Long term (current) use of anticoagulants: Secondary | ICD-10-CM

## 2015-11-17 DIAGNOSIS — I10 Essential (primary) hypertension: Secondary | ICD-10-CM

## 2015-11-17 DIAGNOSIS — Z79899 Other long term (current) drug therapy: Secondary | ICD-10-CM

## 2015-11-17 LAB — URINE CULTURE: Colony Count: >=100000

## 2015-11-17 NOTE — Progress Notes (Signed)
Cardiology Office Note   Date:  11/17/2015   ID:  Hailey [name], DOB 09-08-1935, MRN [id number]  PCP:  Walker Kehr, MD  Cardiologist:  Sinclair Grooms, MD   Chief Complaint  Patient presents with  . Atrial Fibrillation      History of Present Illness: Hailey [name] is a 81 y.o. female who presents for PSVT, paroxysmal atrial fibrillation, and hypertension.  Doing well. Rare episodes of palpitation. She has not had syncope. He'll also on metoprolol has ceased. She is tolerating metoprolol without complications. She has some exertional dyspnea particularly up incline. She denies PND and lower extremity swelling. Myocardial perfusion imaging and echocardiography have not demonstrated any evidence of ischemia or systolic/diastolic dysfunction.    Past Medical History  Diagnosis Date  . History of colon polyps   . GERD (gastroesophageal reflux disease)   . HTN (hypertension)   . SVT (supraventricular tachycardia) ([place])     brief history  . PAC (premature atrial contraction)     Symptomatiic  . Anxiety   . Allergic rhinitis   . IBS (irritable bowel syndrome)     constipation predominant - Dr Earlean Shawl  . Diverticulosis of colon   . Hip bursitis 2010    Dr Para March, Post op seroma  . Osteopenia 12/2011    T score -1.3 FRAX 10%/1.9%  . Atrial fibrillation ([place])   . Lichen sclerosus     Past Surgical History  Procedure Laterality Date  . Bladder surgery    . Breast reduction surgery    . Appendectomy    . Troch bursa resection  2010  . Vaginal hysterectomy      BSO  . Oophorectomy      BSO  . Toe surgery       Current Outpatient Prescriptions  Medication Sig Dispense Refill  . cetirizine (ZYRTEC) 10 MG tablet Take 10 mg by mouth daily as needed.     . ciprofloxacin (CIPRO) 250 MG tablet Take 1 tablet (250 mg total) by mouth 2 (two) times daily. 10 tablet 0  . clobetasol cream (TEMOVATE) 0.05 % APPLY NIGHTLY AS DIRECTED. 15 g 1  . DULoxetine (CYMBALTA) 30 MG  capsule Take 1 capsule (30 mg total) by mouth daily. 30 capsule 2  . ELIQUIS 5 MG TABS tablet TAKE 1 TABLET TWICE DAILY. 180 tablet 2  . esomeprazole (NEXIUM) 40 MG capsule Take 40 mg by mouth daily before breakfast.      . LORazepam (ATIVAN) 1 MG tablet Take 1 tablet (1 mg total) by mouth 2 (two) times daily as needed for anxiety or sleep. 180 tablet 1  . metoprolol succinate (TOPROL-XL) 50 MG 24 hr tablet Take 1 tablet (50 mg total) by mouth 2 (two) times daily. 60 tablet 11  . pravastatin (PRAVACHOL) 20 MG tablet Take 1 tablet (20 mg total) by mouth every Monday, Wednesday, and Friday. 45 tablet 3  . terconazole (TERAZOL 7) 0.4 % vaginal cream Place 1 applicator vaginally at bedtime. For 7 nights 45 g 0   No current facility-administered medications for this visit.    Allergies:   Macrobid; Meloxicam; Digoxin and related; Sulfamethoxazole-trimethoprim; and Tramadol    Social History:  The patient  reports that she quit smoking about 40 years ago. She has never used smokeless tobacco. She reports that she drinks about 4.2 oz of alcohol per week. She reports that she does not use illicit drugs.   Family History:  The patient's family history includes Cancer in  her brother and father; Colon cancer in her mother; Diabetes in her father. There is no history of Heart attack or Stroke.    ROS:  Please see the history of present illness.   Otherwise, review of systems are positive for None.   All other systems are reviewed and negative.    PHYSICAL EXAM: VS:  BP 118/76 mmHg  Pulse 54  Ht 5' 4.5" (1.638 m)  Wt 173 lb 6.4 oz (78.654 kg)  BMI 29.32 kg/m2 , BMI Body mass index is 29.32 kg/(m^2). GEN: Well nourished, well developed, in no acute distress HEENT: normal Neck: no JVD, carotid bruits, or masses Cardiac: RRR.  There is no murmur, rub, or gallop. There is no edema. Respiratory:  clear to auscultation bilaterally, normal work of breathing. GI: soft, nontender, nondistended, + BS MS:  no deformity or atrophy Skin: warm and dry, no rash Neuro:  Strength and sensation are intact Psych: euthymic mood, full affect   EKG:  EKG is ordered today. The ekg reveals sinus bradycardia, poor R-wave progression. First degree AV block.   Recent Labs: 04/29/2015: ALT 16; TSH 2.11 09/16/2015: BUN 26*; Creat 1.25*; Hemoglobin 11.9*; Platelets 277; Potassium 4.1; Sodium 137    Lipid Panel    Component Value Date/Time   CHOL 153 07/04/2014 0528   TRIG 97 07/04/2014 0528   TRIG 183* 07/26/2006 0903   HDL 69 07/04/2014 0528   CHOLHDL 2.2 07/04/2014 0528   CHOLHDL 2.8 CALC 07/26/2006 0903   VLDL 19 07/04/2014 0528   LDLCALC 65 07/04/2014 0528   LDLDIRECT 89.1 06/29/2013 1604      Wt Readings from Last 3 Encounters:  11/17/15 173 lb 6.4 oz (78.654 kg)  10/10/15 174 lb (78.926 kg)  06/09/15 173 lb (78.472 kg)      Other studies Reviewed: Additional studies/ records that were reviewed today include: Echocardiogram and prior nuclear study.. The findings include reiterated with her that both studies were normal..    ASSESSMENT AND PLAN:  1. Coronary artery disease involving native coronary artery based on coronary calcification on CT No ischemia by nuclear scintigraphy. Patient is maintained on statin therapy.  2. PAF (paroxysmal atrial fibrillation) ([place]) Controlled on the current medical regimen  3. Essential hypertension Controlled  4. 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 Chronic anticoagulation therapy Therapy with Eliquis  Current medicines are reviewed at length with the patient today.  The patient has the following concerns regarding medicines: None.  The following changes/actions have been instituted:    Aerobic activity  Call of chest discomfort  Labs/ tests ordered today include: none  No orders of the defined types were placed in this encounter.     Disposition:   FU with HS in 1 year  Signed, Sinclair Grooms, MD  11/17/2015 12:37 PM    [address], [place], [place]  [address] Phone: [phone]; Fax: [phone]

## 2015-11-17 NOTE — Patient Instructions (Signed)
Medication Instructions:  Your physician recommends that you continue on your current medications as directed. Please refer to the Current Medication list given to you today.   Labwork: None ordered  Testing/Procedures: None ordered  Follow-Up: Your physician wants you to follow-up in: 9 months You will receive a reminder letter in the mail two months in advance. If you don't receive a letter, please call our office to schedule the follow-up appointment.   Any Other Special Instructions Will Be Listed Below (If Applicable).     If you need a refill on your cardiac medications before your next appointment, please call your pharmacy.

## 2015-12-01 DIAGNOSIS — L570 Actinic keratosis: Secondary | ICD-10-CM | POA: Diagnosis not present

## 2015-12-01 DIAGNOSIS — L820 Inflamed seborrheic keratosis: Secondary | ICD-10-CM | POA: Diagnosis not present

## 2015-12-01 DIAGNOSIS — L918 Other hypertrophic disorders of the skin: Secondary | ICD-10-CM | POA: Diagnosis not present

## 2015-12-05 ENCOUNTER — Ambulatory Visit
Admission: RE | Admit: 2015-12-05 | Discharge: 2015-12-05 | Disposition: A | Discharge status: Home / Self Care | Payer: Medicare Other | Source: Ambulatory Visit | Attending: Internal Medicine | Admitting: Internal Medicine

## 2015-12-05 DIAGNOSIS — N281 Cyst of kidney, acquired: Secondary | ICD-10-CM | POA: Diagnosis not present

## 2015-12-08 ENCOUNTER — Ambulatory Visit (INDEPENDENT_AMBULATORY_CARE_PROVIDER_SITE_OTHER): Payer: Medicare Other | Admitting: Sports Medicine

## 2015-12-08 ENCOUNTER — Encounter: Payer: Self-pay | Admitting: Sports Medicine

## 2015-12-08 VITALS — BP 123/87 | Ht 64.5 in | Wt 165.0 lb

## 2015-12-08 DIAGNOSIS — M25551 Pain in right hip: Secondary | ICD-10-CM

## 2015-12-08 NOTE — Progress Notes (Signed)
  Hailey [name] - 81 y.o. female MRN [id number]  Date of birth: Aug 13, 1935  SUBJECTIVE:  Including CC & ROS.  Hailey [name] is an established patient who returns to sports medicine clinic for evaluation of new right leg and lower back pain. She exercises regularly with a trainer and was warming up for a session by walking on a treadmill when she suddenly began to experience a sharp pain in the lateral right thigh that extended down to, but did not include, the right knee. She denies numbness of tingling in either leg. Since that time, she has continued to experience intermittent pain in the right knee, occasional pain in the right lower back, and rare pain in the left lateral thigh. 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 Pain is worse with standing or sitting still. She has noted some improvement with tylenol.   RoS: no LBP/ No sciatic sxs   PHYSICAL EXAM:  VS: BP:123/87 mmHg  HT:5' 4.5" (163.8 cm)   WT:165 lb (74.844 kg)  BMI:27.9 PHYSICAL EXAM: General: Well-appearing female, in no acute distress MSK:  R Hip: Full range of motion with flexion, extension, and rotation. Mild pain with compression of lateral left hip. Strength appropriate in quadriceps and hamstring. Notable weakness in gluteus medius and minimus as noted by decreased strength in left hip abduction and rotation.  Gait: Increase in dynamic genu valgus noted bilaterally.   ASSESSMENT & PLAN: See problem based charting & AVS for pt instructions. 81 y.o. F presenting with 1 week history of pain in R lateral proximal leg and right lower back. She has history of L ITB syndrome. Initially there was concern for low back radiculopathy based on history, but no evidence of this on physical exam. Exam did, however, demonstrate increased dynamic genu valgus while walking, and weakness in hip abduction and rotation. - Recommend emphasizing hip abduction and hip rotation strengthening during training sessions. This includes  standing lateral leg lifts and lateral step-ups. A paper copy of these recommendations was given to Hailey [name] to hand off to her trainer [place].  - Will plan to see Hailey [name] back in clinic to see if there is improvement in her symptoms with appropriate strengthening exercises.

## 2015-12-08 NOTE — Patient Instructions (Signed)
Hailey [name] has significant increase in dynamic genu valgus She is really strong on testing quadriceps, Hamstring, hip flexors Her gluteus medius and minimus are extremely weak and they control the motion of the ITB  We need to emphasize getting hip abduction stronger and hip rotation stronger Watch her walk and make sure knees are not crossing midline so much coming forward  This will improve a lot over next 2 months.

## 2015-12-08 NOTE — Assessment & Plan Note (Signed)
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

## 2015-12-28 MED ORDER — METHOCARBAMOL 500 MG PO TABS: 500.0000 mg | ORAL_TABLET | Freq: Three times a day (TID) | ORAL | Status: DC

## 2015-12-28 NOTE — Addendum Note (Signed)
Addended by: Cyd Silence on: 12/28/2015 03:10 PM   Modules accepted: Orders

## 2015-12-30 ENCOUNTER — Other Ambulatory Visit: Payer: Self-pay | Admitting: Gynecology

## 2016-01-04 DIAGNOSIS — M7601 Gluteal tendinitis, right hip: Secondary | ICD-10-CM | POA: Diagnosis not present

## 2016-01-04 DIAGNOSIS — M25551 Pain in right hip: Secondary | ICD-10-CM | POA: Diagnosis not present

## 2016-01-06 ENCOUNTER — Other Ambulatory Visit: Payer: Self-pay | Admitting: Internal Medicine

## 2016-01-06 NOTE — Telephone Encounter (Signed)
Done

## 2016-01-10 DIAGNOSIS — M7601 Gluteal tendinitis, right hip: Secondary | ICD-10-CM | POA: Diagnosis not present

## 2016-01-10 DIAGNOSIS — M25551 Pain in right hip: Secondary | ICD-10-CM | POA: Diagnosis not present

## 2016-01-12 DIAGNOSIS — M25551 Pain in right hip: Secondary | ICD-10-CM | POA: Diagnosis not present

## 2016-01-12 DIAGNOSIS — M7601 Gluteal tendinitis, right hip: Secondary | ICD-10-CM | POA: Diagnosis not present

## 2016-01-17 DIAGNOSIS — M25551 Pain in right hip: Secondary | ICD-10-CM | POA: Diagnosis not present

## 2016-01-17 DIAGNOSIS — M7601 Gluteal tendinitis, right hip: Secondary | ICD-10-CM | POA: Diagnosis not present

## 2016-01-19 DIAGNOSIS — M7601 Gluteal tendinitis, right hip: Secondary | ICD-10-CM | POA: Diagnosis not present

## 2016-01-19 DIAGNOSIS — M25551 Pain in right hip: Secondary | ICD-10-CM | POA: Diagnosis not present

## 2016-01-26 DIAGNOSIS — M7601 Gluteal tendinitis, right hip: Secondary | ICD-10-CM | POA: Diagnosis not present

## 2016-01-26 DIAGNOSIS — M25551 Pain in right hip: Secondary | ICD-10-CM | POA: Diagnosis not present

## 2016-02-02 DIAGNOSIS — M25551 Pain in right hip: Secondary | ICD-10-CM | POA: Diagnosis not present

## 2016-02-02 DIAGNOSIS — M7601 Gluteal tendinitis, right hip: Secondary | ICD-10-CM | POA: Diagnosis not present

## 2016-04-10 ENCOUNTER — Telehealth: Payer: Self-pay

## 2016-04-10 NOTE — Telephone Encounter (Signed)
Patient called and states she had pain on her L side. She only wants to see Dr. Camila Li. She asked to see if he could work her in this week. She will be out of town next week. I informed her was already booked and overbooked. So it was a long shot but we could ask. Please advise. Thank you.

## 2016-04-10 NOTE — Telephone Encounter (Signed)
OK Thur 1 pm Thx

## 2016-04-11 NOTE — Telephone Encounter (Signed)
OV is already scheduled for 04/12/16 at 9:30 am. Pt is aware.

## 2016-04-11 NOTE — Telephone Encounter (Signed)
Patient called back.  Got her scheduled.

## 2016-04-12 ENCOUNTER — Encounter: Payer: Self-pay | Admitting: Internal Medicine

## 2016-04-12 ENCOUNTER — Ambulatory Visit (INDEPENDENT_AMBULATORY_CARE_PROVIDER_SITE_OTHER): Payer: Medicare Other | Admitting: Internal Medicine

## 2016-04-12 DIAGNOSIS — R1032 Left lower quadrant pain: Secondary | ICD-10-CM | POA: Diagnosis not present

## 2016-04-12 DIAGNOSIS — K5732 Diverticulitis of large intestine without perforation or abscess without bleeding: Secondary | ICD-10-CM

## 2016-04-12 MED ORDER — METRONIDAZOLE 500 MG PO TABS: 500.0000 mg | ORAL_TABLET | Freq: Three times a day (TID) | ORAL | 0 refills | Status: DC

## 2016-04-12 MED ORDER — AMOXICILLIN-POT CLAVULANATE 875-125 MG PO TABS: 1.0000 | ORAL_TABLET | Freq: Two times a day (BID) | ORAL | 0 refills | Status: DC

## 2016-04-12 NOTE — Progress Notes (Signed)
Subjective:  Patient ID: Hailey [name], female    DOB: [birth year]  Age: 81 y.o. MRN: [id number]  CC: Abdominal Pain (LLQ x 2 weeks, worse when lying)   HPI Hailey [name] presents for LLQ abd pain, worse at night x 2 weeks. BMs are more normal x 2 weeks; better after BMs  Outpatient Medications Prior to Visit  Medication Sig Dispense Refill  . cetirizine (ZYRTEC) 10 MG tablet Take 10 mg by mouth daily as needed.     . clobetasol cream (TEMOVATE) 0.05 % APPLY NIGHTLY AS DIRECTED. 30 g 1  . ELIQUIS 5 MG TABS tablet TAKE 1 TABLET TWICE DAILY. 180 tablet 2  . esomeprazole (NEXIUM) 40 MG capsule Take 40 mg by mouth daily before breakfast.      . LORazepam (ATIVAN) 1 MG tablet TAKE (1) TABLET TWICE A DAY AS NEEDED. 180 tablet 1  . metoprolol succinate (TOPROL-XL) 50 MG 24 hr tablet Take 1 tablet (50 mg total) by mouth 2 (two) times daily. 60 tablet 11  . pravastatin (PRAVACHOL) 20 MG tablet Take 1 tablet (20 mg total) by mouth every Monday, Wednesday, and Friday. 45 tablet 3  . ciprofloxacin (CIPRO) 250 MG tablet Take 1 tablet (250 mg total) by mouth 2 (two) times daily. 10 tablet 0  . DULoxetine (CYMBALTA) 30 MG capsule Take 1 capsule (30 mg total) by mouth daily. 30 capsule 2  . methocarbamol (ROBAXIN) 500 MG tablet Take 1 tablet (500 mg total) by mouth 3 (three) times daily. (Patient not taking: Reported on 04/12/2016) 90 tablet 0  . terconazole (TERAZOL 7) 0.4 % vaginal cream Place 1 applicator vaginally at bedtime. For 7 nights (Patient not taking: Reported on 04/12/2016) 45 g 0   No facility-administered medications prior to visit.     ROS Review of Systems  Constitutional: Positive for chills. Negative for activity change, appetite change, fatigue and unexpected weight change.  HENT: Negative for congestion, mouth sores and sinus pressure.   Eyes: Negative for visual disturbance.  Respiratory: Negative for cough and chest tightness.   Gastrointestinal: Positive for abdominal pain.  Negative for nausea.  Genitourinary: Negative for difficulty urinating, frequency and vaginal pain.  Musculoskeletal: Negative for back pain and gait problem.  Skin: Negative for pallor and rash.  Neurological: Negative for dizziness, tremors, weakness, numbness and headaches.  Psychiatric/Behavioral: Negative for confusion and sleep disturbance.    Objective:  BP 100/78   Pulse 77   Temp 98.1 F (36.7 C) (Oral)   Wt 172 lb (78 kg)   SpO2 95%   BMI 29.07 kg/m   BP Readings from Last 3 Encounters:  04/12/16 100/78  12/08/15 123/87  11/17/15 118/76    Wt Readings from Last 3 Encounters:  04/12/16 172 lb (78 kg)  12/08/15 165 lb (74.8 kg)  11/17/15 173 lb 6.4 oz (78.7 kg)    Physical Exam  Constitutional: She appears well-developed. No distress.  HENT:  Head: Normocephalic.  Right Ear: External ear normal.  Left Ear: External ear normal.  Nose: Nose normal.  Mouth/Throat: Oropharynx is clear and moist.  Eyes: Conjunctivae are normal. Pupils are equal, round, and reactive to light. Right eye exhibits no discharge. Left eye exhibits no discharge.  Neck: Normal range of motion. Neck supple. No JVD present. No tracheal deviation present. No thyromegaly present.  Cardiovascular: Normal rate, regular rhythm and normal heart sounds.   Pulmonary/Chest: No stridor. No respiratory distress. She has no wheezes.  Abdominal: Soft. Bowel sounds are normal.  She exhibits no distension and no mass. There is tenderness. There is no rebound and no guarding.  Musculoskeletal: She exhibits no edema or tenderness.  Lymphadenopathy:    She has no cervical adenopathy.  Neurological: She displays normal reflexes. No cranial nerve deficit. She exhibits normal muscle tone. Coordination normal.  Skin: No rash noted. No erythema.  Psychiatric: She has a normal mood and affect. Her behavior is normal. Judgment and thought content normal.  LLQ sensitive  Lab Results  Component Value Date   WBC 6.9  09/16/2015   HGB 11.9 (L) 09/16/2015   HCT 37.1 09/16/2015   PLT 277 09/16/2015   GLUCOSE 83 09/16/2015   CHOL 153 07/04/2014   TRIG 97 07/04/2014   HDL 69 07/04/2014   LDLDIRECT 89.1 06/29/2013   LDLCALC 65 07/04/2014   ALT 16 04/29/2015   AST 24 04/29/2015   NA 137 09/16/2015   K 4.1 09/16/2015   CL 102 09/16/2015   CREATININE 1.25 (H) 09/16/2015   BUN 26 (H) 09/16/2015   CO2 23 09/16/2015   TSH 2.11 04/29/2015   INR 1.27 07/04/2014   HGBA1C 5.6 07/04/2014    US Abdomen Complete  Result Date: 12/05/2015 CLINICAL DATA:  Abdominal pain and nausea. Gastroesophageal reflux disease. EXAM: ABDOMEN ULTRASOUND COMPLETE COMPARISON:  CT scan dated 07/03/2014 and abdominal ultrasound dated 08/18/2008 FINDINGS: Gallbladder: No gallstones or wall thickening visualized. No sonographic Murphy sign noted by sonographer. Common bile duct: Diameter: 3.6 mm, normal. Liver: No focal lesion identified. Within normal limits in parenchymal echogenicity. IVC: No abnormality visualized. Pancreas: Visualized portion unremarkable. Spleen: Size and appearance within normal limits.  8.6 cm in length. Right Kidney: Length: 10.3 cm. Echogenicity within normal limits. No mass or hydronephrosis visualized. Left Kidney: Length: 10.8 cm. Echogenicity within normal limits. 12 mm cyst in the midportion, minimally more prominent than on the prior CT scan. Abdominal aorta: Normal. Other findings: None. IMPRESSION: No significant abnormality. Small benign appearing cyst on the left kidney. Electronically Signed   By: Lorriane Shire M.D.   On: 12/05/2015 09:47    Assessment & Plan:   There are no diagnoses linked to this encounter. I have discontinued Ms. Urbieta's ciprofloxacin. I am also having her maintain her esomeprazole, cetirizine, pravastatin, ELIQUIS, metoprolol succinate, terconazole, methocarbamol, clobetasol cream, LORazepam, and DULoxetine.  Meds ordered this encounter  Medications  . DULoxetine (CYMBALTA) 60  MG capsule    Sig: Take 1 capsule by mouth daily.     Follow-up: No Follow-up on file.  Walker Kehr, MD

## 2016-04-12 NOTE — Assessment & Plan Note (Signed)
Augmentin No Flagyl due to ETOH

## 2016-04-12 NOTE — Progress Notes (Signed)
Pre visit review using our clinic review tool, if applicable. No additional management support is needed unless otherwise documented below in the visit note. 

## 2016-04-12 NOTE — Patient Instructions (Signed)
Low-Fiber Diet °Fiber is found in fruits, vegetables, and whole grains. A low-fiber diet restricts fibrous foods that are not digested in the small intestine. A diet containing about 10-15 grams of fiber per day is considered low fiber. Low-fiber diets may be used to: °· Promote healing and rest the bowel during intestinal flare-ups. °· Prevent blockage of a partially obstructed or narrowed gastrointestinal tract. °· Reduce fecal weight and volume. °· Slow the movement of feces. °You may be on a low-fiber diet as a transitional diet following surgery, after an injury (trauma), or because of a short (acute) or lifelong (chronic) illness. Your health care provider will determine the length of time you need to stay on this diet.  °WHAT DO I NEED TO KNOW ABOUT A LOW-FIBER DIET? °Always check the fiber content on the packaging's Nutrition Facts label, especially on foods from the grains list. Ask your dietitian if you have questions about specific foods that are related to your condition, especially if the food is not listed below. In general, a low-fiber food will have less than 2 g of fiber. °WHAT FOODS CAN I EAT? °Grains °All breads and crackers made with white flour. Sweet rolls, doughnuts, waffles, pancakes, French toast, bagels. Pretzels, Melba toast, zwieback. Well-cooked cereals, such as cornmeal, farina, or cream cereals. Dry cereals that do not contain whole grains, fruit, or nuts, such as refined corn, wheat, rice, and oat cereals. Potatoes prepared any way without skins, plain pastas and noodles, refined white rice. Use white flour for baking and making sauces. Use allowed list of grains for casseroles, dumplings, and puddings.  °Vegetables °Strained tomato and vegetable juices. Fresh lettuce, cucumber, spinach. Well-cooked (no skin or pulp) or canned vegetables, such as asparagus, bean sprouts, beets, carrots, green beans, mushrooms, potatoes, pumpkin, spinach, yellow squash, tomato sauce/puree, turnips,  yams, and zucchini. Keep servings limited to ½ cup.  °Fruits °All fruit juices except prune juice. Cooked or canned fruits without skin and seeds, such as applesauce, apricots, cherries, fruit cocktail, grapefruit, grapes, mandarin oranges, melons, peaches, pears, pineapple, and plums. Fresh fruits without skin, such as apricots, avocados, bananas, melons, pineapple, nectarines, and peaches. Keep servings limited to ½ cup or 1 piece.   °Meat and Other Protein Sources °Ground or well-cooked tender beef, ham, veal, lamb, pork, or poultry. Eggs, plain cheese. Fish, oysters, shrimp, lobster, and other seafood. Liver, organ meats. Smooth nut butters. °Dairy °All milk products and alternative dairy substitutes, such as soy, rice, almond, and coconut, not containing added whole nuts, seeds, or added fruit. °Beverages °Decaf coffee, fruit, and vegetable juices or smoothies (small amounts, with no pulp or skins, and with fruits from allowed list), sports drinks, herbal tea. °Condiments °Ketchup, mustard, vinegar, cream sauce, cheese sauce, cocoa powder. Spices in moderation, such as allspice, basil, bay leaves, celery powder or leaves, cinnamon, cumin powder, curry powder, ginger, mace, marjoram, onion or garlic powder, oregano, paprika, parsley flakes, ground pepper, rosemary, sage, savory, tarragon, thyme, and turmeric. °Sweets and Desserts °Plain cakes and cookies, pie made with allowed fruit, pudding, custard, cream pie. Gelatin, fruit, ice, sherbet, frozen ice pops. Ice cream, ice milk without nuts. Plain hard candy, honey, jelly, molasses, syrup, sugar, chocolate syrup, gumdrops, marshmallows. Limit overall sugar intake.  °Fats and Oil °Margarine, butter, cream, mayonnaise, salad oils, plain salad dressings made from allowed foods. Choose healthy fats such as olive oil, canola oil, and omega-3 fatty acids (such as found in salmon or tuna) when possible.  °Other °Bouillon, broth, or cream soups made   from allowed foods.  Any strained soup. Casseroles or mixed dishes made with allowed foods. °The items listed above may not be a complete list of recommended foods or beverages. Contact your dietitian for more options.  °WHAT FOODS ARE NOT RECOMMENDED? °Grains °All whole wheat and whole grain breads and crackers. Multigrains, rye, bran seeds, nuts, or coconut. Cereals containing whole grains, multigrains, bran, coconut, nuts, raisins. Cooked or dry oatmeal, steel-cut oats. Coarse wheat cereals, granola. Cereals advertised as high fiber. Potato skins. Whole grain pasta, wild or brown rice. Popcorn. Coconut flour. Bran, buckwheat, corn bread, multigrains, rye, wheat germ.  °Vegetables °Fresh, cooked or canned vegetables, such as artichokes, asparagus, beet greens, broccoli, Brussels sprouts, cabbage, celery, cauliflower, corn, eggplant, kale, legumes or beans, okra, peas, and tomatoes. Avoid large servings of any vegetables, especially raw vegetables.  °Fruits °Fresh fruits, such as apples with or without skin, berries, cherries, figs, grapes, grapefruit, guavas, kiwis, mangoes, oranges, papayas, pears, persimmons, pineapple, and pomegranate. Prune juice and juices with pulp, stewed or dried prunes. Dried fruits, dates, raisins. Fruit seeds or skins. Avoid large servings of all fresh fruits. °Meats and Other Protein Sources °Tough, fibrous meats with gristle. Chunky nut butter. Cheese made with seeds, nuts, or other foods not recommended. Nuts, seeds, legumes (beans, including baked beans), dried peas, beans, lentils.  °Dairy °Yogurt or cheese that contains nuts, seeds, or added fruit.  °Beverages °Fruit juices with high pulp, prune juice. Caffeinated coffee and teas.  °Condiments °Coconut, maple syrup, pickles, olives. °Sweets and Desserts °Desserts, cookies, or candies that contain nuts or coconut, chunky peanut butter, dried fruits. Jams, preserves with seeds, marmalade. Large amounts of sugar and sweets. Any other dessert made with  fruits from the not recommended list.  °Other °Soups made from vegetables that are not recommended or that contain other foods not recommended.  °The items listed above may not be a complete list of foods and beverages to avoid. Contact your dietitian for more information. °  °This information is not intended to replace advice given to you by your health care provider. Make sure you discuss any questions you have with your health care provider. °  °Document Released: 02/16/2002 Document Revised: 09/01/2013 Document Reviewed: 07/20/2013 °Elsevier Interactive Patient Education ©2016 [employer] ° °

## 2016-04-12 NOTE — Assessment & Plan Note (Signed)
?  diverticulitis - treat w/abx CT if not better Low residue diet x 2 wks

## 2016-04-20 ENCOUNTER — Telehealth: Payer: Self-pay | Admitting: Emergency Medicine

## 2016-04-20 NOTE — Telephone Encounter (Signed)
Pt called and stated she was prescribed metroNIDAZOLE (FLAGYL) 500 MG tablet and  amoxicillin-clavulanate (AUGMENTIN) 875-125 MG tablet. She wants to know how many days she needs to take the medications. Its giving her a headache, makes her mouth feel funny and nausea. Please advise and give her a call back thanks.

## 2016-04-20 NOTE — Telephone Encounter (Signed)
Stop  abx She is aware

## 2016-05-07 DIAGNOSIS — H109 Unspecified conjunctivitis: Secondary | ICD-10-CM | POA: Diagnosis not present

## 2016-05-17 DIAGNOSIS — H00014 Hordeolum externum left upper eyelid: Secondary | ICD-10-CM | POA: Diagnosis not present

## 2016-05-29 ENCOUNTER — Ambulatory Visit (INDEPENDENT_AMBULATORY_CARE_PROVIDER_SITE_OTHER): Payer: Medicare Other | Admitting: Family Medicine

## 2016-05-29 ENCOUNTER — Encounter: Payer: Self-pay | Admitting: Family Medicine

## 2016-05-29 DIAGNOSIS — M545 Low back pain, unspecified: Secondary | ICD-10-CM

## 2016-05-29 MED ORDER — DICLOFENAC SODIUM 1 % TD GEL: 2.0000 g | Freq: Four times a day (QID) | TRANSDERMAL | 1 refills | Status: DC

## 2016-05-29 NOTE — Progress Notes (Signed)
  DESAREA STALTER - 81 y.o. female MRN [id number]  Date of birth: 10-27-1934  SUBJECTIVE:  Including CC & ROS.  Chief Complaint  Patient presents with  . Back Pain    Mr. Eyestone is a 81 year old female that is presenting with acute on chronic low back pain. She reports the pain is on the bilateral aspect of her lower back. She also has pain that radiates down the lateral aspects of both of her hips. She has been busy this summer and has taken trips to [place] as well as to [employer]. The pain on the left side is worse today than the pain on the right side. The pain has been going on for about one week but she has had pain like this previously. The pain in lower back seems like a spasm. She takes Tylenol to help with the pain. She has taken tramadol before but with limited improvement. She reports the pain feels like a sharp stabbing sensation. She has a history of gluteus medius syndrome which has improved with physical therapy. She works out with a Clinical research associate on a regular basis.  ROS: No unexpected weight loss, fever, chills, swelling, instability, numbness/tingling, redness, otherwise see HPI   HISTORY: Past Medical, Surgical, Social, and Family History Reviewed & Updated per EMR.   Pertinent Historical Findings include: PMSHx -  chronic atrial fibrillation (Eliquis), hypertension, CAD, diverticulosis, IBS   DATA REVIEWED: 12/23/14: Left hip and pelvis: Moderate degenerative change of the hips. Degenerative change in the lower lumbar spine. 09/06/10: Lumbar spine: Scoliosis and degenerative changes without findings of acute compression fracture.  PHYSICAL EXAM:  VS: BP:139/83  HR:62bpm  TEMP: ( )  RESP:   HT:5\' 4"  (162.6 cm)   WT:170 lb (77.1 kg)  BMI:29.2 PHYSICAL EXAM: Gen: NAD, alert, cooperative with exam, well-appearing HEENT: clear conjunctiva, EOMI CV:  no edema, capillary refill brisk,  Resp: non-labored, normal speech Skin: no rashes, normal turgor  Neuro: no gross deficits.    Psych:  alert and oriented Back:  No tenderness to palpation over the lumbar spine or the thoracic spine No tenderness palpation of the paraspinal muscles bilaterally.  No tenderness palpation of the SI joints bilaterally.  No tenderness palpation of the greater trochanter bilaterally. Able to achieve full flexion and extension. Flexion reproduces pain. 5 out of 5 strength lower extremities. Negative straight leg raise bilaterally. Corky Sox test was some exacerbation of pain on the right. Weakness with hip abduction bilaterally. Neurovascular intact.  ASSESSMENT & PLAN:   LOW BACK PAIN, ACUTE She has history of back pain and this seems to be similar to previous experiences. 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 The pain may be coming from arthritic changes but also possible to be related to muscle spasm as well as scoliosis was seen on x-rays from 2011. We will start with exercises and if she doesn't have improvement from there waking consider intra-articular hip injections versus medial nerve blocks of her back versus epidurals. - Voltaren cream - Home modalities provided that she can do with her trainer. - She'll follow-up in 2-3 weeks at which point we can consider different injections.

## 2016-05-30 NOTE — Assessment & Plan Note (Signed)
She has history of back pain and this seems to be similar to previous experiences. 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 The pain may be coming from arthritic changes but also possible to be related to muscle spasm as well as scoliosis was seen on x-rays from 2011. We will start with exercises and if she doesn't have improvement from there waking consider intra-articular hip injections versus medial nerve blocks of her back versus epidurals. - Voltaren cream - Home modalities provided that she can do with her trainer. - She'll follow-up in 2-3 weeks at which point we can consider different injections.

## 2016-06-02 ENCOUNTER — Other Ambulatory Visit: Payer: Self-pay | Admitting: Interventional Cardiology

## 2016-06-14 ENCOUNTER — Ambulatory Visit
Admission: RE | Admit: 2016-06-14 | Discharge: 2016-06-14 | Disposition: A | Discharge status: Home / Self Care | Payer: Medicare Other | Source: Ambulatory Visit | Attending: Sports Medicine | Admitting: Sports Medicine

## 2016-06-14 ENCOUNTER — Encounter: Payer: Self-pay | Admitting: *Deleted

## 2016-06-14 ENCOUNTER — Other Ambulatory Visit: Payer: Self-pay | Admitting: *Deleted

## 2016-06-14 DIAGNOSIS — M5137 Other intervertebral disc degeneration, lumbosacral region: Secondary | ICD-10-CM

## 2016-06-14 DIAGNOSIS — M1612 Unilateral primary osteoarthritis, left hip: Secondary | ICD-10-CM | POA: Diagnosis not present

## 2016-06-14 DIAGNOSIS — M47816 Spondylosis without myelopathy or radiculopathy, lumbar region: Secondary | ICD-10-CM | POA: Diagnosis not present

## 2016-06-14 MED ORDER — ACETAMINOPHEN-CODEINE #3 300-30 MG PO TABS: 1.0000 | ORAL_TABLET | ORAL | 0 refills | Status: DC | PRN

## 2016-06-18 ENCOUNTER — Encounter: Payer: Self-pay | Admitting: Sports Medicine

## 2016-06-18 ENCOUNTER — Ambulatory Visit (INDEPENDENT_AMBULATORY_CARE_PROVIDER_SITE_OTHER): Payer: Medicare Other | Admitting: Sports Medicine

## 2016-06-18 VITALS — BP 153/86 | HR 56 | Ht 64.0 in | Wt 170.0 lb

## 2016-06-18 DIAGNOSIS — M5137 Other intervertebral disc degeneration, lumbosacral region: Secondary | ICD-10-CM | POA: Diagnosis not present

## 2016-06-18 MED ORDER — TRAMADOL HCL 50 MG PO TABS: ORAL_TABLET | ORAL | 1 refills | Status: DC

## 2016-06-18 MED ORDER — METHYLPREDNISOLONE ACETATE 80 MG/ML IJ SUSP
80.0000 mg | Freq: Once | INTRAMUSCULAR | Status: AC
  Administered 2016-06-18: 80 mg via INTRAMUSCULAR

## 2016-06-18 NOTE — Progress Notes (Signed)
   Subjective:    Patient ID: SENECA MOBBS, female    DOB: [birth year], 81 y.o.   MRN: [id number]  HPI chief complaint: Low back pain and posterior left hip pain  Patient comes in today complaining of left-sided low back and posterior hip pain. She was seen by Dr.Schmitz a couple of weeks ago in our clinic. He suspected her pain was either originating from her left hip or her lumbar spine. She was using Voltaren gel and was instructed to continue with this but her pain has gotten worse. She has been taking 50 mg of tramadol but it is making her drowsy. It does seem to help with her pain. I also gave her a prescription for Tylenol 3 last Friday but she has not taken it. Her pain is worse when sitting for long periods of time. Also worse when going from a seated to standing position and with prolonged walking. She localizes all of her pain to the left side of her low back and posterior left hip with some radiating pain to the lateral hip. She denies any groin pain. She denies any recent trauma. She has a history of lumbar degenerative disc disease and scoliosis. She has been treated by Dr. Sherwood Gambler in the past. In fact, she has an appointment to see him again on October 24. She tells me that she received epidural injections at his office a few years ago and that they did help. Her pain is similar in nature to what she was experiencing at that time but it is more intense. She denies any associated numbness or tingling.  Interim medical history reviewed Medications reviewed Allergies reviewed    Review of Systems    as above Objective:   Physical Exam  Well-developed, well-nourished. No acute distress.  Lumbar spine: Limited range of motion. Diffusely tender to palpation along the left lumbosacral area. Tender to palpation over the left SI joint as well. Left hip: Smooth painless hip range of motion with a negative logroll. No real tenderness to palpation over the greater trochanteric  bursa. Neurological exam shows no gross neurological deficits of either lower extremity.  X-rays of her left hip show minimal degenerative changes here. X-rays of her lumbar spine shows prominent lumbar spine scoliosis and multilevel degenerative changes. These changes appear to be worse at the L5-S1 level.      Assessment & Plan:   Left-sided low back pain secondary to lumbar spondylosis/DDD  Patient does not demonstrate any evidence of intra-articular hip pathology on today's exam. I've elected to have her injected with 80 mg of Depo-Medrol IM. I've also refilled her tramadol with instructions for her to take half of a 50 mg tablet daily at bedtime when necessary. She will also contact Barbaraann Barthel for some physical therapy. She will keep her appointment with Dr. Sherwood Gambler for October 24. If her pain persists or worsens prior to her appointment with him, then we could consider a short 6 day prednisone taper. She has taken this before. We also discussed the possibility of expediting an appointment with Dr.Ibazebo in lieu of Dr Sherwood Gambler if needed.

## 2016-06-28 ENCOUNTER — Ambulatory Visit: Payer: Medicare Other | Admitting: Sports Medicine

## 2016-07-02 DIAGNOSIS — K219 Gastro-esophageal reflux disease without esophagitis: Secondary | ICD-10-CM | POA: Diagnosis not present

## 2016-07-03 DIAGNOSIS — M546 Pain in thoracic spine: Secondary | ICD-10-CM | POA: Diagnosis not present

## 2016-07-03 DIAGNOSIS — M4155 Other secondary scoliosis, thoracolumbar region: Secondary | ICD-10-CM | POA: Diagnosis not present

## 2016-07-03 DIAGNOSIS — M5136 Other intervertebral disc degeneration, lumbar region: Secondary | ICD-10-CM | POA: Diagnosis not present

## 2016-07-03 DIAGNOSIS — M549 Dorsalgia, unspecified: Secondary | ICD-10-CM | POA: Diagnosis not present

## 2016-07-03 DIAGNOSIS — M47816 Spondylosis without myelopathy or radiculopathy, lumbar region: Secondary | ICD-10-CM | POA: Diagnosis not present

## 2016-07-05 ENCOUNTER — Telehealth: Payer: Self-pay | Admitting: Internal Medicine

## 2016-07-05 NOTE — Telephone Encounter (Signed)
[place] [facility] - Client [facility] Patient Name: Hailey [name] DOB: June 25, 1935 Initial Comment Caller has been having nose bleeds. She had one today for about 8 mins. Nurse Assessment Guidelines Guideline Title Affirmed Question Affirmed Notes Final Disposition User Clinical Call Gaddy, RN, Felicia Comments caller said she is on the phone with another nurse that analyzed her symptoms and is making her an appointment

## 2016-07-06 ENCOUNTER — Ambulatory Visit (INDEPENDENT_AMBULATORY_CARE_PROVIDER_SITE_OTHER): Payer: Medicare Other | Admitting: Nurse Practitioner

## 2016-07-06 ENCOUNTER — Other Ambulatory Visit (INDEPENDENT_AMBULATORY_CARE_PROVIDER_SITE_OTHER): Payer: Medicare Other

## 2016-07-06 ENCOUNTER — Encounter: Payer: Self-pay | Admitting: Nurse Practitioner

## 2016-07-06 VITALS — BP 148/74 | HR 86 | Temp 97.7°F | Ht 64.5 in | Wt 170.0 lb

## 2016-07-06 DIAGNOSIS — R040 Epistaxis: Secondary | ICD-10-CM | POA: Diagnosis not present

## 2016-07-06 LAB — CBC WITH DIFFERENTIAL/PLATELET
Basophils Absolute: 0 10*3/uL (ref 0.0–0.1)
Basophils Relative: 0.4 % (ref 0.0–3.0)
Eosinophils Absolute: 0.2 10*3/uL (ref 0.0–0.7)
Eosinophils Relative: 2.7 % (ref 0.0–5.0)
HCT: 38.4 % (ref 36.0–46.0)
Hemoglobin: 13.1 g/dL (ref 12.0–15.0)
Lymphocytes Relative: 17.9 % (ref 12.0–46.0)
Lymphs Abs: 1.5 10*3/uL (ref 0.7–4.0)
MCHC: 34.1 g/dL (ref 30.0–36.0)
MCV: 92.7 fl (ref 78.0–100.0)
Monocytes Absolute: 0.8 10*3/uL (ref 0.1–1.0)
Monocytes Relative: 9.2 % (ref 3.0–12.0)
Neutro Abs: 5.8 10*3/uL (ref 1.4–7.7)
Neutrophils Relative %: 69.8 % (ref 43.0–77.0)
Platelets: 238 10*3/uL (ref 150.0–400.0)
RBC: 4.14 Mil/uL (ref 3.87–5.11)
RDW: 14.7 % (ref 11.5–15.5)
WBC: 8.2 10*3/uL (ref 4.0–10.5)

## 2016-07-06 MED ORDER — OXYMETAZOLINE HCL 0.05 % NA SOLN: 1.0000 | Freq: Two times a day (BID) | NASAL | 0 refills | Status: DC | PRN

## 2016-07-06 MED ORDER — SALINE SPRAY 0.65 % NA SOLN: 1.0000 | Freq: Two times a day (BID) | NASAL | 0 refills | Status: DC | PRN

## 2016-07-06 NOTE — Patient Instructions (Addendum)
checK BP once a day. If BP greater than 150/90 for more than 2days, call office for further evaluation.  Nosebleed Nosebleeds are common. A nosebleed can be caused by many things, including:  Getting hit hard in the nose.  Infections.  Dryness in your nose.  A dry climate.  Medicines.  Picking your nose.  Your home heating and cooling systems. HOME CARE   Try controlling your nosebleed by pinching your nostrils gently. Do this for at least 10 minutes.  Avoid blowing or sniffing your nose for a number of hours after having a nosebleed.  Do not put gauze inside of your nose yourself. If your nose was packed by your doctor, try to keep the pack inside of your nose until your doctor removes it.  If a gauze pack was used and it starts to fall out, gently replace it or cut off the end of it.  If a balloon catheter was used to pack your nose, do not cut or remove it unless told by your doctor.  Avoid lying down while you are having a nosebleed. Sit up and lean forward.  Use a nasal spray decongestant to help with a nosebleed as told by your doctor.  Do not use petroleum jelly or mineral oil in your nose. These can drip into your lungs.  Keep your house humid by using:  Less air conditioning.  A humidifier.  Aspirin and blood thinners make bleeding more likely. If you are prescribed these medicines and you have nosebleeds, ask your doctor if you should stop taking the medicines or adjust the dose. Do not stop medicines unless told by your doctor.  Resume your normal activities as you are able. Avoid straining, lifting, or bending at your waist for several days.  If your nosebleed was caused by dryness in your nose, use over-the-counter saline nasal spray or gel. If you must use a lubricant:  Choose one that is water-soluble.  Use it only as needed.  Do not use it within several hours of lying down.  Keep all follow-up visits as told by your doctor. This is  important. GET HELP IF:  You have a fever.  You get frequent nosebleeds.  You are getting nosebleeds more often. GET HELP RIGHT AWAY IF:  Your nosebleed lasts longer than 20 minutes.  Your nosebleed occurs after an injury to your face, and your nose looks crooked or broken.  You have unusual bleeding from other parts of your body.  You have unusual bruising on other parts of your body.  You feel light-headed or dizzy.  You become sweaty.  You throw up (vomit) blood.  You have a nosebleed after a head injury.   This information is not intended to replace advice given to you by your health care provider. Make sure you discuss any questions you have with your health care provider.   Document Released: 06/05/2008 Document Revised: 09/17/2014 Document Reviewed: 04/12/2014 Elsevier Interactive Patient Education [employer].

## 2016-07-06 NOTE — Progress Notes (Signed)
Pre visit review using our clinic review tool, if applicable. No additional management support is needed unless otherwise documented below in the visit note. 

## 2016-07-06 NOTE — Progress Notes (Signed)
Subjective:  Patient ID: Hailey [name], female    DOB: 05-May-1935  Age: 81 y.o. MRN: [id number]  CC: Epistaxis (Pt stated having nose bleed during late afternoon 1 week 5x)   Epistaxis   The bleeding has been from the right nare. This is a new problem. The current episode started in the past 7 days. The problem occurs every several days. The problem has been waxing and waning. The bleeding is associated with anticoagulants. She has tried pressure for the symptoms. The treatment provided significant relief. Her past medical history is significant for allergies. There is no history of colds, frequent nosebleeds or sinus problems.    Outpatient Medications Prior to Visit  Medication Sig Dispense Refill  . apixaban (ELIQUIS) 5 MG TABS tablet Take 1 tablet (5 mg total) by mouth 2 (two) times daily. 180 tablet 1  . cetirizine (ZYRTEC) 10 MG tablet Take 10 mg by mouth daily as needed.     . clobetasol cream (TEMOVATE) 0.05 % APPLY NIGHTLY AS DIRECTED. 30 g 1  . DULoxetine (CYMBALTA) 60 MG capsule Take 1 capsule by mouth daily.    Marland Kitchen esomeprazole (NEXIUM) 40 MG capsule Take 40 mg by mouth daily before breakfast.      . LORazepam (ATIVAN) 1 MG tablet TAKE (1) TABLET TWICE A DAY AS NEEDED. 180 tablet 1  . methocarbamol (ROBAXIN) 500 MG tablet Take 1 tablet (500 mg total) by mouth 3 (three) times daily. 90 tablet 0  . metoprolol succinate (TOPROL-XL) 50 MG 24 hr tablet Take 1 tablet (50 mg total) by mouth 2 (two) times daily. 60 tablet 11  . metroNIDAZOLE (FLAGYL) 500 MG tablet Take 1 tablet (500 mg total) by mouth 3 (three) times daily. 21 tablet 0  . pravastatin (PRAVACHOL) 20 MG tablet Take one (1) tablet (20 mg total) by mouth three (3) times a week. (Mondays, Wednesdays, and Fridays evenings) 39 tablet 1  . terconazole (TERAZOL 7) 0.4 % vaginal cream Place 1 applicator vaginally at bedtime. For 7 nights 45 g 0  . amoxicillin-clavulanate (AUGMENTIN) 875-125 MG tablet Take 1 tablet by mouth 2 (two)  times daily. 20 tablet 0  . acetaminophen-codeine (TYLENOL #3) 300-30 MG tablet Take 1 tablet by mouth every 4 (four) hours as needed for moderate pain. (Patient not taking: Reported on 07/06/2016) 30 tablet 0  . diclofenac sodium (VOLTAREN) 1 % GEL Apply 2 g topically 4 (four) times daily. (Patient not taking: Reported on 07/06/2016) 100 g 1  . traMADol (ULTRAM) 50 MG tablet Take 1/2 tab as needed for pain (Patient not taking: Reported on 07/06/2016) 30 tablet 1   No facility-administered medications prior to visit.     ROS See HPI  Objective:  BP (!) 148/74 (BP Location: Left Arm, Patient Position: Sitting, Cuff Size: Normal)   Pulse 86   Temp 97.7 F (36.5 C)   Ht 5' 4.5" (1.638 m)   Wt 170 lb (77.1 kg)   SpO2 98%   BMI 28.73 kg/m   BP Readings from Last 3 Encounters:  07/06/16 (!) 148/74  06/18/16 (!) 153/86  05/29/16 139/83    Wt Readings from Last 3 Encounters:  07/06/16 170 lb (77.1 kg)  06/18/16 170 lb (77.1 kg)  05/29/16 170 lb (77.1 kg)    Physical Exam  Constitutional: She is oriented to person, place, and time. No distress.  HENT:  Right Ear: Tympanic membrane, external ear and ear canal normal.  Left Ear: Tympanic membrane, external ear and ear canal  normal.  Nose: Mucosal edema present. No rhinorrhea, sinus tenderness or nasal septal hematoma. Right sinus exhibits no maxillary sinus tenderness and no frontal sinus tenderness. Left sinus exhibits no maxillary sinus tenderness and no frontal sinus tenderness.  Mouth/Throat: No oropharyngeal exudate.  Eyes: Conjunctivae and EOM are normal. Pupils are equal, round, and reactive to light. No scleral icterus.  Neck: Normal range of motion. Neck supple.  Cardiovascular: Normal rate.   Pulmonary/Chest: Effort normal.  Musculoskeletal: Normal range of motion. She exhibits no edema.  Lymphadenopathy:    She has no cervical adenopathy.  Neurological: She is alert and oriented to person, place, and time. No cranial  nerve deficit. Coordination normal.  Vitals reviewed.   Lab Results  Component Value Date   WBC 6.9 09/16/2015   HGB 11.9 (L) 09/16/2015   HCT 37.1 09/16/2015   PLT 277 09/16/2015   GLUCOSE 83 09/16/2015   CHOL 153 07/04/2014   TRIG 97 07/04/2014   HDL 69 07/04/2014   LDLDIRECT 89.1 06/29/2013   LDLCALC 65 07/04/2014   ALT 16 04/29/2015   AST 24 04/29/2015   NA 137 09/16/2015   K 4.1 09/16/2015   CL 102 09/16/2015   CREATININE 1.25 (H) 09/16/2015   BUN 26 (H) 09/16/2015   CO2 23 09/16/2015   TSH 2.11 04/29/2015   INR 1.27 07/04/2014   HGBA1C 5.6 07/04/2014    Dg Lumbar Spine 2-3 Views  Result Date: 06/14/2016 CLINICAL DATA:  Back pain.  Radiation to left hip. EXAM: LUMBAR SPINE - 2-3 VIEW COMPARISON:  CT 07/03/2014. FINDINGS: Prominent lumbar spine scoliosis. No acute bony abnormality identified. Degenerative changes lumbar spine and both hips. Aortoiliac atherosclerotic vascular disease. IMPRESSION: 1. Prominent lumbar spine scoliosis and degenerative change. No acute abnormality. 2. Aortoiliac atherosclerotic vascular disease. Electronically Signed   By: Marcello Moores  Register   On: 06/14/2016 12:56   Dg Hip Unilat With Pelvis 2-3 Views Left  Result Date: 06/14/2016 CLINICAL DATA:  Pain radiating to left hip.  No known injury. EXAM: DG HIP (WITH OR WITHOUT PELVIS) 2-3V LEFT COMPARISON:  12/23/2014 . FINDINGS: Degenerative changes lumbar spine and left hip. No acute bony or joint abnormality. No evidence of fracture or dislocation. IMPRESSION: Degenerative changes.  No acute abnormality. Electronically Signed   By: Marcello Moores  Register   On: 06/14/2016 12:54    Assessment & Plan:   Ahlayla was seen today for epistaxis.  Diagnoses and all orders for this visit:  Acute anterior epistaxis -     CBC with Differential/Platelet; Future -     sodium chloride (OCEAN) 0.65 % SOLN nasal spray; Place 1 spray into both nostrils 2 (two) times daily as needed for congestion. -      oxymetazoline (AFRIN NASAL SPRAY) 0.05 % nasal spray; Place 1 spray into both nostrils 2 (two) times daily as needed for congestion. Use only for 3days, then stop   I have discontinued Ms. Broyles's amoxicillin-clavulanate. I am also having her start on sodium chloride and oxymetazoline. Additionally, I am having her maintain her esomeprazole, cetirizine, metoprolol succinate, terconazole, methocarbamol, clobetasol cream, LORazepam, DULoxetine, metroNIDAZOLE, diclofenac sodium, apixaban, pravastatin, acetaminophen-codeine, traMADol, famotidine, and FLUAD.  Meds ordered this encounter  Medications  . famotidine (PEPCID) 40 MG tablet  . FLUAD 0.5 ML SUSY  . sodium chloride (OCEAN) 0.65 % SOLN nasal spray    Sig: Place 1 spray into both nostrils 2 (two) times daily as needed for congestion.    Dispense:  15 mL    Refill:  0    Order Specific Question:   Supervising Provider    Answer:   Cassandria Anger [[id number]]  . oxymetazoline (AFRIN NASAL SPRAY) 0.05 % nasal spray    Sig: Place 1 spray into both nostrils 2 (two) times daily as needed for congestion. Use only for 3days, then stop    Dispense:  30 mL    Refill:  0    Order Specific Question:   Supervising Provider    Answer:   Cassandria Anger [[id number]]    Follow-up: Return if symptoms worsen or fail to improve.  Wilfred Lacy, NP

## 2016-07-07 DIAGNOSIS — M5440 Lumbago with sciatica, unspecified side: Secondary | ICD-10-CM | POA: Diagnosis not present

## 2016-07-07 DIAGNOSIS — M5126 Other intervertebral disc displacement, lumbar region: Secondary | ICD-10-CM | POA: Diagnosis not present

## 2016-07-09 DIAGNOSIS — M4155 Other secondary scoliosis, thoracolumbar region: Secondary | ICD-10-CM | POA: Diagnosis not present

## 2016-07-09 DIAGNOSIS — I10 Essential (primary) hypertension: Secondary | ICD-10-CM | POA: Diagnosis not present

## 2016-07-09 DIAGNOSIS — Q763 Congenital scoliosis due to congenital bony malformation: Secondary | ICD-10-CM | POA: Diagnosis not present

## 2016-07-09 DIAGNOSIS — M5136 Other intervertebral disc degeneration, lumbar region: Secondary | ICD-10-CM | POA: Diagnosis not present

## 2016-07-09 NOTE — Progress Notes (Signed)
Normal results, see office note

## 2016-07-12 ENCOUNTER — Other Ambulatory Visit: Payer: Self-pay | Admitting: Internal Medicine

## 2016-07-12 DIAGNOSIS — H353131 Nonexudative age-related macular degeneration, bilateral, early dry stage: Secondary | ICD-10-CM | POA: Diagnosis not present

## 2016-07-12 DIAGNOSIS — H3589 Other specified retinal disorders: Secondary | ICD-10-CM | POA: Diagnosis not present

## 2016-07-12 DIAGNOSIS — H25013 Cortical age-related cataract, bilateral: Secondary | ICD-10-CM | POA: Diagnosis not present

## 2016-07-26 ENCOUNTER — Other Ambulatory Visit: Payer: Self-pay | Admitting: Gynecology

## 2016-07-26 DIAGNOSIS — L821 Other seborrheic keratosis: Secondary | ICD-10-CM | POA: Diagnosis not present

## 2016-07-26 DIAGNOSIS — L814 Other melanin hyperpigmentation: Secondary | ICD-10-CM | POA: Diagnosis not present

## 2016-07-26 DIAGNOSIS — D692 Other nonthrombocytopenic purpura: Secondary | ICD-10-CM | POA: Diagnosis not present

## 2016-07-26 DIAGNOSIS — Z1231 Encounter for screening mammogram for malignant neoplasm of breast: Secondary | ICD-10-CM

## 2016-07-26 DIAGNOSIS — D1801 Hemangioma of skin and subcutaneous tissue: Secondary | ICD-10-CM | POA: Diagnosis not present

## 2016-08-01 ENCOUNTER — Ambulatory Visit (INDEPENDENT_AMBULATORY_CARE_PROVIDER_SITE_OTHER): Payer: Medicare Other | Admitting: Interventional Cardiology

## 2016-08-01 ENCOUNTER — Encounter: Payer: Self-pay | Admitting: Interventional Cardiology

## 2016-08-01 VITALS — BP 104/76 | HR 62 | Ht 64.5 in | Wt 172.4 lb

## 2016-08-01 DIAGNOSIS — R0602 Shortness of breath: Secondary | ICD-10-CM

## 2016-08-01 DIAGNOSIS — I10 Essential (primary) hypertension: Secondary | ICD-10-CM

## 2016-08-01 DIAGNOSIS — I2510 Atherosclerotic heart disease of native coronary artery without angina pectoris: Secondary | ICD-10-CM | POA: Diagnosis not present

## 2016-08-01 DIAGNOSIS — I480 Paroxysmal atrial fibrillation: Secondary | ICD-10-CM | POA: Diagnosis not present

## 2016-08-01 DIAGNOSIS — D681 Hereditary factor XI deficiency: Secondary | ICD-10-CM

## 2016-08-01 DIAGNOSIS — Z7901 Long term (current) use of anticoagulants: Secondary | ICD-10-CM

## 2016-08-01 NOTE — Progress Notes (Signed)
Cardiology Office Note    Date:  08/01/2016   ID:  Kamrey, Steimle [birth year], MRN [id number]  PCP:  Walker Kehr, MD  Cardiologist: Sinclair Grooms, MD   Chief Complaint  Patient presents with  . Atrial Fibrillation    History of Present Illness:  Hailey [name] is a 81 y.o. female who presents for PSVT, paroxysmal atrial fibrillation, and hypertension.    She is doing well. She is concerned by extensive bruising on her legs. She has dyspnea on exertion. She has coronary disease that we feel is nonobstructive based upon a nuclear study that was low risk 2 years ago. Dyspnea is not improving and is also not worsening. She denies orthopnea. She has not short of breath at rest. She has had no neurological complaints. No blood in her urine or stool.   Past Medical History:  Diagnosis Date  . Allergic rhinitis   . Anxiety   . Atrial fibrillation ([place])   . Diverticulosis of colon   . GERD (gastroesophageal reflux disease)   . Hip bursitis 2010   Dr Para March, Post op seroma  . History of colon polyps   . HTN (hypertension)   . IBS (irritable bowel syndrome)    constipation predominant - Dr Earlean Shawl  . Lichen sclerosus   . Osteopenia 12/2011   T score -1.3 FRAX 10%/1.9%  . PAC (premature atrial contraction)    Symptomatiic  . SVT (supraventricular tachycardia) ([place])    brief history    Past Surgical History:  Procedure Laterality Date  . APPENDECTOMY    . BLADDER SURGERY    . BREAST REDUCTION SURGERY    . OOPHORECTOMY     BSO  . TOE SURGERY    . Troch Bursa Resection  2010  . VAGINAL HYSTERECTOMY     BSO    Current Medications: Outpatient Medications Prior to Visit  Medication Sig Dispense Refill  . apixaban (ELIQUIS) 5 MG TABS tablet Take 1 tablet (5 mg total) by mouth 2 (two) times daily. 180 tablet 1  . cetirizine (ZYRTEC) 10 MG tablet Take 10 mg by mouth daily as needed for allergies or rhinitis.     . clobetasol cream (TEMOVATE) 0.05 % APPLY NIGHTLY  AS DIRECTED. 30 g 1  . esomeprazole (NEXIUM) 40 MG capsule Take 40 mg by mouth daily before breakfast.      . metoprolol succinate (TOPROL-XL) 50 MG 24 hr tablet Take 1 tablet (50 mg total) by mouth 2 (two) times daily. 60 tablet 11  . pravastatin (PRAVACHOL) 20 MG tablet Take one (1) tablet (20 mg total) by mouth three (3) times a week. (Mondays, Wednesdays, and Fridays evenings) 39 tablet 1  . sodium chloride (OCEAN) 0.65 % SOLN nasal spray Place 1 spray into both nostrils 2 (two) times daily as needed for congestion. 15 mL 0  . acetaminophen-codeine (TYLENOL #3) 300-30 MG tablet Take 1 tablet by mouth every 4 (four) hours as needed for moderate pain. (Patient not taking: Reported on 08/01/2016) 30 tablet 0  . diclofenac sodium (VOLTAREN) 1 % GEL Apply 2 g topically 4 (four) times daily. (Patient not taking: Reported on 08/01/2016) 100 g 1  . DULoxetine (CYMBALTA) 60 MG capsule TAKE (1) CAPSULE DAILY. (Patient not taking: Reported on 08/01/2016) 90 capsule 1  . famotidine (PEPCID) 40 MG tablet     . FLUAD 0.5 ML SUSY     . LORazepam (ATIVAN) 1 MG tablet TAKE (1) TABLET TWICE A DAY AS NEEDED. (  Patient not taking: Reported on 08/01/2016) 180 tablet 1  . methocarbamol (ROBAXIN) 500 MG tablet Take 1 tablet (500 mg total) by mouth 3 (three) times daily. (Patient not taking: Reported on 08/01/2016) 90 tablet 0  . metroNIDAZOLE (FLAGYL) 500 MG tablet Take 1 tablet (500 mg total) by mouth 3 (three) times daily. (Patient not taking: Reported on 08/01/2016) 21 tablet 0  . oxymetazoline (AFRIN NASAL SPRAY) 0.05 % nasal spray Place 1 spray into both nostrils 2 (two) times daily as needed for congestion. Use only for 3days, then stop (Patient not taking: Reported on 08/01/2016) 30 mL 0  . terconazole (TERAZOL 7) 0.4 % vaginal cream Place 1 applicator vaginally at bedtime. For 7 nights (Patient not taking: Reported on 08/01/2016) 45 g 0  . traMADol (ULTRAM) 50 MG tablet Take 1/2 tab as needed for pain (Patient  not taking: Reported on 08/01/2016) 30 tablet 1   No facility-administered medications prior to visit.      Allergies:   Macrobid [nitrofurantoin monohyd macro]; Meloxicam; Digoxin and related; Sulfamethoxazole-trimethoprim; and Tramadol   Social History   Social History  . Marital status: Married    Spouse name: N/A  . Number of children: 2  . Years of education: N/A   Occupational History  . Travel Agent Retired   Social History Main Topics  . Smoking status: Former Smoker    Quit date: 09/11/1975  . Smokeless tobacco: Never Used  . Alcohol use 4.2 oz/week    7 Standard drinks or equivalent per week  . Drug use: No  . Sexual activity: Yes    Birth control/ protection: Surgical     Comment: 1st intercourse 81 yo--Fewer than 5 partners   Other Topics Concern  . None   Social History Narrative   Regular Exercise -  YES           Family History:  The patient's family history includes Cancer in her brother and father; Colon cancer in her mother; Diabetes in her father.   ROS:   Please see the history of present illness.    Shortness of breath, easy bruising, headache.  All other systems reviewed and are negative.   PHYSICAL EXAM:   VS:  BP 104/76 (BP Location: Left Arm)   Pulse 62   Ht 5' 4.5" (1.638 m)   Wt 172 lb 6.4 oz (78.2 kg)   BMI 29.14 kg/m    GEN: Well nourished, well developed, in no acute distress  HEENT: normal  Neck: no JVD, carotid bruits, or masses Cardiac: RRR; no murmurs, rubs, or gallops,no edema . Ecchymoses bilateral on each lower extremity. Respiratory:  clear to auscultation bilaterally, normal work of breathing GI: soft, nontender, nondistended, + BS MS: no deformity or atrophy  Skin: warm and dry, no rash Neuro:  Alert and Oriented x 3, Strength and sensation are intact Psych: euthymic mood, full affect  Wt Readings from Last 3 Encounters:  08/01/16 172 lb 6.4 oz (78.2 kg)  07/06/16 170 lb (77.1 kg)  06/18/16 170 lb (77.1 kg)       Studies/Labs Reviewed:   EKG:  EKG  Not repeated.  Recent Labs: 09/16/2015: BUN 26; Creat 1.25; Potassium 4.1; Sodium 137 07/06/2016: Hemoglobin 13.1; Platelets 238.0   Lipid Panel    Component Value Date/Time   CHOL 153 07/04/2014 0528   TRIG 97 07/04/2014 0528   TRIG 183 (H) 07/26/2006 0903   HDL 69 07/04/2014 0528   CHOLHDL 2.2 07/04/2014 0528   VLDL 19  07/04/2014 0528   LDLCALC 65 07/04/2014 0528   LDLDIRECT 89.1 06/29/2013 1604    Additional studies/ records that were reviewed today include:  Echocardiogram January 2017:  Study Conclusions  - Left ventricle: The cavity size was normal. Wall thickness was   normal. Systolic function was normal. The estimated ejection   fraction was in the range of 55% to 60%. Wall motion was normal;   there were no regional wall motion abnormalities. - Mitral valve: There was mild regurgitation. - Left atrium: The atrium was mildly dilated. - Right atrium: The atrium was mildly dilated. - Tricuspid valve: There was mild-moderate regurgitation directed   centrally. - Pulmonary arteries: Systolic pressure was mildly increased. PA   peak pressure: 38 mm Hg (S).  Nuclear stress test 07/04/14: IMPRESSION: 1. No reversible ischemia or infarction.  2. Normal left ventricular wall motion.  3. Left ventricular ejection fraction 82%  4. Low-risk stress test findings*.  ASSESSMENT:    1. PAF (paroxysmal atrial fibrillation) ([place])   2. CAD in native artery   3. Essential hypertension   4. Shortness of breath   5. 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 Previous documentation of paroxysmal atrial fibrillation. No recent assessment of atrial fibrillation burden. The patient does not want to be on anticoagulation therapy. She has a stroke risk of approximately 3-4% per year based on the CHADS of 3. She was to be considered for Watchman and to then be able to  discontinue anticoagulation therapy. I will refer her to Dr. Rayann Heman to consider management of her atrial fibrillation and see what recommendations surface. 2. Coronary calcification noted by CT scanning. Nuclear scintigraphy done in 2015 was negative for ischemia, please see above. 3. Excellent blood pressure control. 4. Dyspnea has been a continuous complaint. This occurred phenomenally with exertion. A nuclear study 2 years ago was unremarkable for ischemia. We know she has coronary disease based on three-vessel calcification in the past. I will check a BNP today. I may need to consider repeating her nuclear perfusion study. 5. Chronic anticoagulation therapy is with APIXABAN. She has had bleeding difficulties since being on anticoagulation. She fell once not a huge hematoma in her left leg. She has had no internal bleeding. She always complains of the ecchymoses related to anticoagulation on her legs in particular. She wants to have watchman performed so that she can discontinue anticoagulation therapy. She will also speak with Dr. Rayann Heman about her candidacy further therapy.    Medication Adjustments/Labs and Tests Ordered: Current medicines are reviewed at length with the patient today.  Concerns regarding medicines are outlined above.  Medication changes, Labs and Tests ordered today are listed in the Patient Instructions below. Patient Instructions  Medication Instructions:  None  Labwork: BNP today  Testing/Procedures: None  Follow-Up: Your physician wants you to follow-up in: 9 months with Dr. Tamala Julian. You will receive a reminder letter in the mail two months in advance. If you don't receive a letter, please call our office to schedule the follow-up appointment.  You have been referred to Dr. Rayann Heman for consideration for Watchman.    Any Other Special Instructions Will Be Listed Below (If Applicable).     If you need a refill on your cardiac medications before your next  appointment, please call your pharmacy.      Signed, Sinclair Grooms, MD  08/01/2016 2:50 PM  [facility] [place], [place], [place]  [address] Phone: [phone]; Fax: [phone]

## 2016-08-01 NOTE — Patient Instructions (Signed)
Medication Instructions:  None  Labwork: BNP today  Testing/Procedures: None  Follow-Up: Your physician wants you to follow-up in: 9 months with Dr. Tamala Julian. You will receive a reminder letter in the mail two months in advance. If you don't receive a letter, please call our office to schedule the follow-up appointment.  You have been referred to Dr. Rayann Heman for consideration for Watchman.    Any Other Special Instructions Will Be Listed Below (If Applicable).     If you need a refill on your cardiac medications before your next appointment, please call your pharmacy.

## 2016-08-02 LAB — BRAIN NATRIURETIC PEPTIDE: Brain Natriuretic Peptide: 185.4 pg/mL — ABNORMAL HIGH (ref <100)

## 2016-08-06 ENCOUNTER — Telehealth: Payer: Self-pay | Admitting: *Deleted

## 2016-08-06 DIAGNOSIS — R0602 Shortness of breath: Secondary | ICD-10-CM

## 2016-08-06 DIAGNOSIS — I25709 Atherosclerosis of coronary artery bypass graft(s), unspecified, with unspecified angina pectoris: Secondary | ICD-10-CM

## 2016-08-06 NOTE — Telephone Encounter (Signed)
Spoke with pt and informed her of orders. Pt verbalized understanding and was in agreement with this plan.

## 2016-08-06 NOTE — Telephone Encounter (Signed)
-----   Message from Belva Crome, MD sent at 08/05/2016  8:01 PM EST ----- Let the patient know no fluid build up causing SOB. Suggest she have repeat Lexiscan nuclear to exclude ischemia. Has 3 vessel CA calcification by CT in 2015. A copy will be sent to Walker Kehr, MD

## 2016-08-13 ENCOUNTER — Telehealth (HOSPITAL_COMMUNITY): Payer: Self-pay | Admitting: *Deleted

## 2016-08-13 NOTE — Telephone Encounter (Signed)
Left message on voicemail per DPR in reference to upcoming appointment scheduled on 08/15/16 at 0730 with detailed instructions given per Myocardial Perfusion Study Information Sheet for the test. LM to arrive 15 minutes early, and that it is imperative to arrive on time for appointment to keep from having the test rescheduled. If you need to cancel or reschedule your appointment, please call the office within 24 hours of your appointment. Failure to do so may result in a cancellation of your appointment, and a $50 no show fee. Phone number given for call back for any questions.

## 2016-08-15 ENCOUNTER — Ambulatory Visit (HOSPITAL_COMMUNITY): Payer: Medicare Other | Attending: Interventional Cardiology

## 2016-08-15 DIAGNOSIS — R0602 Shortness of breath: Secondary | ICD-10-CM

## 2016-08-15 DIAGNOSIS — I25709 Atherosclerosis of coronary artery bypass graft(s), unspecified, with unspecified angina pectoris: Secondary | ICD-10-CM | POA: Insufficient documentation

## 2016-08-15 LAB — MYOCARDIAL PERFUSION IMAGING
LV dias vol: 74 mL (ref 46–106)
LV sys vol: 25 mL
Peak HR: 74 {beats}/min
RATE: 0.28
Rest HR: 55 {beats}/min
SDS: 5
SRS: 9
SSS: 14
TID: 0.99

## 2016-08-15 MED ORDER — TECHNETIUM TC 99M TETROFOSMIN IV KIT
31.3000 | PACK | Freq: Once | INTRAVENOUS | Status: AC | PRN
  Administered 2016-08-15: 31.3 via INTRAVENOUS
  Filled 2016-08-15: qty 32

## 2016-08-15 MED ORDER — TECHNETIUM TC 99M TETROFOSMIN IV KIT
10.3000 | PACK | Freq: Once | INTRAVENOUS | Status: AC | PRN
  Administered 2016-08-15: 10.3 via INTRAVENOUS
  Filled 2016-08-15: qty 11

## 2016-08-15 MED ORDER — REGADENOSON 0.4 MG/5ML IV SOLN
0.4000 mg | Freq: Once | INTRAVENOUS | Status: AC
  Administered 2016-08-15: 0.4 mg via INTRAVENOUS

## 2016-08-17 ENCOUNTER — Encounter: Payer: Self-pay | Admitting: Internal Medicine

## 2016-08-23 ENCOUNTER — Institutional Professional Consult (permissible substitution): Payer: Medicare Other | Admitting: Internal Medicine

## 2016-09-11 ENCOUNTER — Other Ambulatory Visit: Payer: Self-pay | Admitting: Interventional Cardiology

## 2016-09-11 ENCOUNTER — Telehealth: Payer: Self-pay | Admitting: Internal Medicine

## 2016-09-11 NOTE — Telephone Encounter (Signed)
Per dr Alain Marion, ok to double book on Thursday at 11:30---patient advised

## 2016-09-11 NOTE — Telephone Encounter (Signed)
Pt was out of town and she had an episode with her diverticulitis. She state is was a horrible experience and she wants to come in to see only Dr. Alain Marion because of personal reasons. She states she feels abit better but she is really worried she still has an infection.  I have her scheduled next available next tues and she is hoping he can squeeze her in sooner. Please advise

## 2016-09-12 ENCOUNTER — Ambulatory Visit
Admission: RE | Admit: 2016-09-12 | Discharge: 2016-09-12 | Disposition: A | Discharge status: Home / Self Care | Payer: Medicare Other | Source: Ambulatory Visit | Attending: Gynecology | Admitting: Gynecology

## 2016-09-12 DIAGNOSIS — Z1231 Encounter for screening mammogram for malignant neoplasm of breast: Secondary | ICD-10-CM | POA: Diagnosis not present

## 2016-09-13 ENCOUNTER — Ambulatory Visit (INDEPENDENT_AMBULATORY_CARE_PROVIDER_SITE_OTHER): Payer: Medicare Other | Admitting: Internal Medicine

## 2016-09-13 ENCOUNTER — Other Ambulatory Visit (INDEPENDENT_AMBULATORY_CARE_PROVIDER_SITE_OTHER): Payer: Medicare Other

## 2016-09-13 ENCOUNTER — Encounter: Payer: Self-pay | Admitting: Internal Medicine

## 2016-09-13 DIAGNOSIS — K5732 Diverticulitis of large intestine without perforation or abscess without bleeding: Secondary | ICD-10-CM | POA: Diagnosis not present

## 2016-09-13 DIAGNOSIS — K529 Noninfective gastroenteritis and colitis, unspecified: Secondary | ICD-10-CM

## 2016-09-13 DIAGNOSIS — I480 Paroxysmal atrial fibrillation: Secondary | ICD-10-CM

## 2016-09-13 DIAGNOSIS — A0471 Enterocolitis due to Clostridium difficile, recurrent: Secondary | ICD-10-CM | POA: Insufficient documentation

## 2016-09-13 DIAGNOSIS — F439 Reaction to severe stress, unspecified: Secondary | ICD-10-CM | POA: Diagnosis not present

## 2016-09-13 DIAGNOSIS — K219 Gastro-esophageal reflux disease without esophagitis: Secondary | ICD-10-CM

## 2016-09-13 LAB — CBC WITH DIFFERENTIAL/PLATELET
Basophils Absolute: 0 10*3/uL (ref 0.0–0.1)
Basophils Relative: 0.4 % (ref 0.0–3.0)
Eosinophils Absolute: 0.3 10*3/uL (ref 0.0–0.7)
Eosinophils Relative: 3.9 % (ref 0.0–5.0)
HCT: 35.5 % — ABNORMAL LOW (ref 36.0–46.0)
Hemoglobin: 12.1 g/dL (ref 12.0–15.0)
Lymphocytes Relative: 21.7 % (ref 12.0–46.0)
Lymphs Abs: 1.4 10*3/uL (ref 0.7–4.0)
MCHC: 34.1 g/dL (ref 30.0–36.0)
MCV: 92.5 fl (ref 78.0–100.0)
Monocytes Absolute: 0.8 10*3/uL (ref 0.1–1.0)
Monocytes Relative: 11.9 % (ref 3.0–12.0)
Neutro Abs: 4.1 10*3/uL (ref 1.4–7.7)
Neutrophils Relative %: 62.1 % (ref 43.0–77.0)
Platelets: 259 10*3/uL (ref 150.0–400.0)
RBC: 3.83 Mil/uL — ABNORMAL LOW (ref 3.87–5.11)
RDW: 14.1 % (ref 11.5–15.5)
WBC: 6.5 10*3/uL (ref 4.0–10.5)

## 2016-09-13 LAB — BASIC METABOLIC PANEL
BUN: 26 mg/dL — ABNORMAL HIGH (ref 6–23)
CO2: 28 mEq/L (ref 19–32)
Calcium: 9.3 mg/dL (ref 8.4–10.5)
Chloride: 105 mEq/L (ref 96–112)
Creatinine, Ser: 1.16 mg/dL (ref 0.40–1.20)
GFR: 47.8 mL/min — ABNORMAL LOW (ref >60.00)
Glucose, Bld: 97 mg/dL (ref 70–99)
Potassium: 4.7 mEq/L (ref 3.5–5.1)
Sodium: 138 mEq/L (ref 135–145)

## 2016-09-13 LAB — HEPATIC FUNCTION PANEL
ALT: 9 U/L (ref 0–35)
AST: 18 U/L (ref 0–37)
Albumin: 4.1 g/dL (ref 3.5–5.2)
Alkaline Phosphatase: 53 U/L (ref 39–117)
Bilirubin, Direct: 0.1 mg/dL (ref 0.0–0.3)
Total Bilirubin: 0.5 mg/dL (ref 0.2–1.2)
Total Protein: 6.9 g/dL (ref 6.0–8.3)

## 2016-09-13 LAB — LIPASE: Lipase: 41 U/L (ref 11.0–59.0)

## 2016-09-13 MED ORDER — CIPROFLOXACIN HCL 500 MG PO TABS: 500.0000 mg | ORAL_TABLET | Freq: Two times a day (BID) | ORAL | 0 refills | Status: AC

## 2016-09-13 MED ORDER — PROMETHAZINE HCL 25 MG PO TABS: 25.0000 mg | ORAL_TABLET | Freq: Three times a day (TID) | ORAL | 0 refills | Status: DC | PRN

## 2016-09-13 MED ORDER — DIPHENOXYLATE-ATROPINE 2.5-0.025 MG PO TABS: 1.0000 | ORAL_TABLET | Freq: Four times a day (QID) | ORAL | 1 refills | Status: DC | PRN

## 2016-09-13 NOTE — Patient Instructions (Signed)
Take Nexium for a few days Use Lactaid as needed

## 2016-09-13 NOTE — Assessment & Plan Note (Signed)
Take Nexium

## 2016-09-13 NOTE — Assessment & Plan Note (Addendum)
abx for travel - Cipro

## 2016-09-13 NOTE — Assessment & Plan Note (Signed)
acute - ?food poisoning vs other Lomotil/Promethazine for travel

## 2016-09-13 NOTE — Progress Notes (Signed)
Subjective:  Patient ID: Hailey [name], female    DOB: 1935/03/18  Age: 82 y.o. MRN: [id number]  CC: Diverticulitis   HPI Hailey [name] presents for a spell of diverticulitis that started 1 week ago when they were returning from a cruise in the [place]: horrible diarrhea and nausea, achy - better now. She had a sandwich and a milkshake at the airport prior. She took Imodium  Outpatient Medications Prior to Visit  Medication Sig Dispense Refill  . Acetaminophen (TYLENOL PO) Take 2 tablets by mouth daily as needed (pain).    Marland Kitchen apixaban (ELIQUIS) 5 MG TABS tablet Take 1 tablet (5 mg total) by mouth 2 (two) times daily. 180 tablet 1  . cetirizine (ZYRTEC) 10 MG tablet Take 10 mg by mouth daily as needed for allergies or rhinitis.     . clobetasol cream (TEMOVATE) 0.05 % APPLY NIGHTLY AS DIRECTED. 30 g 1  . DULoxetine (CYMBALTA) 60 MG capsule Take 60 mg by mouth daily.    Marland Kitchen esomeprazole (NEXIUM) 40 MG capsule Take 40 mg by mouth daily before breakfast.      . LORazepam (ATIVAN) 1 MG tablet Take 1 mg by mouth at bedtime.    . metoprolol succinate (TOPROL-XL) 50 MG 24 hr tablet TAKE 1 TABLET TWICE DAILY. 180 tablet 3  . pravastatin (PRAVACHOL) 20 MG tablet Take one (1) tablet (20 mg total) by mouth three (3) times a week. (Mondays, Wednesdays, and Fridays evenings) 39 tablet 1  . sodium chloride (OCEAN) 0.65 % SOLN nasal spray Place 1 spray into both nostrils 2 (two) times daily as needed for congestion. 15 mL 0  . diclofenac sodium (VOLTAREN) 1 % GEL Apply 2 g topically 4 (four) times daily as needed (pain).     No facility-administered medications prior to visit.     ROS Review of Systems  Constitutional: Negative for activity change, appetite change, chills, fatigue and unexpected weight change.  HENT: Negative for congestion, mouth sores and sinus pressure.   Eyes: Negative for visual disturbance.  Respiratory: Negative for cough and chest tightness.   Gastrointestinal: Negative  for abdominal pain, constipation, diarrhea, nausea and vomiting.  Genitourinary: Negative for difficulty urinating, frequency and vaginal pain.  Musculoskeletal: Negative for back pain and gait problem.  Skin: Negative for pallor and rash.  Neurological: Negative for dizziness, tremors, weakness, numbness and headaches.  Psychiatric/Behavioral: Negative for confusion and sleep disturbance.    Objective:  BP 120/82 (BP Location: Left Arm)   Pulse (!) 56   Temp 97.9 F (36.6 C) (Oral)   Wt 174 lb 6.4 oz (79.1 kg)   SpO2 95%   BMI 29.94 kg/m   BP Readings from Last 3 Encounters:  09/13/16 120/82  08/01/16 104/76  07/06/16 (!) 148/74    Wt Readings from Last 3 Encounters:  09/13/16 174 lb 6.4 oz (79.1 kg)  08/15/16 172 lb (78 kg)  08/01/16 172 lb 6.4 oz (78.2 kg)    Physical Exam  Constitutional: She appears well-developed. No distress.  HENT:  Head: Normocephalic.  Right Ear: External ear normal.  Left Ear: External ear normal.  Nose: Nose normal.  Mouth/Throat: Oropharynx is clear and moist.  Eyes: Conjunctivae are normal. Pupils are equal, round, and reactive to light. Right eye exhibits no discharge. Left eye exhibits no discharge.  Neck: Normal range of motion. Neck supple. No JVD present. No tracheal deviation present. No thyromegaly present.  Cardiovascular: Normal rate, regular rhythm and normal heart sounds.   Pulmonary/Chest:  No stridor. No respiratory distress. She has no wheezes.  Abdominal: Soft. Bowel sounds are normal. She exhibits no distension and no mass. There is no tenderness. There is no rebound and no guarding.  Musculoskeletal: She exhibits no edema or tenderness.  Lymphadenopathy:    She has no cervical adenopathy.  Neurological: She displays normal reflexes. No cranial nerve deficit. She exhibits normal muscle tone. Coordination normal.  Skin: No rash noted. No erythema.  Psychiatric: She has a normal mood and affect. Her behavior is normal.  Judgment and thought content normal.  tearful  Lab Results  Component Value Date   WBC 8.2 07/06/2016   HGB 13.1 07/06/2016   HCT 38.4 07/06/2016   PLT 238.0 07/06/2016   GLUCOSE 83 09/16/2015   CHOL 153 07/04/2014   TRIG 97 07/04/2014   HDL 69 07/04/2014   LDLDIRECT 89.1 06/29/2013   LDLCALC 65 07/04/2014   ALT 16 04/29/2015   AST 24 04/29/2015   NA 137 09/16/2015   K 4.1 09/16/2015   CL 102 09/16/2015   CREATININE 1.25 (H) 09/16/2015   BUN 26 (H) 09/16/2015   CO2 23 09/16/2015   TSH 2.11 04/29/2015   INR 1.27 07/04/2014   HGBA1C 5.6 07/04/2014    Mm Screening Breast Tomo Bilateral  Result Date: 09/13/2016 CLINICAL DATA:  Screening. EXAM: 2D DIGITAL SCREENING BILATERAL MAMMOGRAM WITH CAD AND ADJUNCT TOMO COMPARISON:  Previous exam(s). ACR Breast Density Category c: The breast tissue is heterogeneously dense, which may obscure small masses. FINDINGS: There are no findings suspicious for malignancy. Images were processed with CAD. IMPRESSION: No mammographic evidence of malignancy. A result letter of this screening mammogram will be mailed directly to the patient. RECOMMENDATION: Screening mammogram in one year. (Code:SM-B-01Y) BI-RADS CATEGORY  1: Negative. Electronically Signed   By: Claudie Revering M.D.   On: 09/13/2016 11:00    Assessment & Plan:   There are no diagnoses linked to this encounter. I have discontinued Ms. Phillips's diclofenac sodium. I am also having her maintain her esomeprazole, cetirizine, clobetasol cream, apixaban, pravastatin, sodium chloride, Acetaminophen (TYLENOL PO), DULoxetine, LORazepam, and metoprolol succinate.  No orders of the defined types were placed in this encounter.    Follow-up: No Follow-up on file.  Walker Kehr, MD

## 2016-09-13 NOTE — Assessment & Plan Note (Signed)
Coreg and eliquis

## 2016-09-13 NOTE — Assessment & Plan Note (Signed)
Son w/gastric ca Discussed Will ref to psychology

## 2016-09-13 NOTE — Progress Notes (Signed)
Pre visit review using our clinic review tool, if applicable. No additional management support is needed unless otherwise documented below in the visit note. 

## 2016-09-18 ENCOUNTER — Ambulatory Visit: Payer: Medicare Other | Admitting: Internal Medicine

## 2016-09-20 NOTE — Telephone Encounter (Signed)
Hailey [name],  Please inform patient that I got her phone  message:  1. Ref to see a psychologist (Dr Rexene Edison) was placed on the same day - Corky can call [facility] to check on the appt date. 2. Labs were OK - I emailed a MyChart message Thx

## 2016-09-21 NOTE — Telephone Encounter (Signed)
Left detailed mess informing pt of below.  

## 2016-10-01 ENCOUNTER — Ambulatory Visit (INDEPENDENT_AMBULATORY_CARE_PROVIDER_SITE_OTHER): Payer: Medicare Other | Admitting: Gynecology

## 2016-10-01 ENCOUNTER — Other Ambulatory Visit: Payer: Self-pay | Admitting: Internal Medicine

## 2016-10-01 ENCOUNTER — Encounter: Payer: Self-pay | Admitting: Gynecology

## 2016-10-01 VITALS — BP 124/76 | Ht 63.0 in | Wt 172.0 lb

## 2016-10-01 DIAGNOSIS — L900 Lichen sclerosus et atrophicus: Secondary | ICD-10-CM | POA: Diagnosis not present

## 2016-10-01 DIAGNOSIS — N952 Postmenopausal atrophic vaginitis: Secondary | ICD-10-CM | POA: Diagnosis not present

## 2016-10-01 DIAGNOSIS — Z01411 Encounter for gynecological examination (general) (routine) with abnormal findings: Secondary | ICD-10-CM | POA: Diagnosis not present

## 2016-10-01 DIAGNOSIS — M8580 Other specified disorders of bone density and structure, unspecified site: Secondary | ICD-10-CM | POA: Diagnosis not present

## 2016-10-01 MED ORDER — CLOBETASOL PROPIONATE 0.05 % EX CREA: TOPICAL_CREAM | CUTANEOUS | 2 refills | Status: DC

## 2016-10-01 NOTE — Patient Instructions (Signed)
Follow up for bone density as scheduled. Follow up for bone density as scheduled.

## 2016-10-01 NOTE — Progress Notes (Signed)
    NAYLANIE MARENTES Dec 10, 1934 [id number]        82 y.o.  G2P2002 for annual exam.    Past medical history,surgical history, problem list, medications, allergies, family history and social history were all reviewed and documented as reviewed in the EPIC chart.  ROS:  Performed with pertinent positives and negatives included in the history, assessment and plan.   Additional significant findings :  None   Exam: [name] assistant Vitals:   10/01/16 1449  BP: 124/76  Weight: 172 lb (78 kg)  Height: 5\' 3"  (1.6 m)   Body mass index is 30.47 kg/m.  General appearance:  Normal affect, orientation and appearance. Skin: Grossly normal HEENT: Without gross lesions.  No cervical or supraclavicular adenopathy. Thyroid normal.  Lungs:  Clear without wheezing, rales or rhonchi Cardiac: RR, without RMG Abdominal:  Soft, nontender, without masses, guarding, rebound, organomegaly or hernia Breasts:  Examined lying and sitting without masses, retractions, discharge or axillary adenopathy.  With bilateral reduction scars noted Pelvic:  Ext, BUS, Vagina with atrophic changes. Blanching at the perineal body and posterior fourchette consistent with history of lichen sclerosus  Adnexa without masses or tenderness    Anus and perineum normal   Rectovaginal normal sphincter tone without palpated masses or tenderness.    Assessment/Plan:  82 y.o. [id number] female for annual exam.   1. Postmenopausal/atrophic genital changes. Had been on estradiol 0.375 patch. Status post TVH BSO in the past. Wean herself off last year and is doing well without significant hot flushes night sweats or vaginal dryness. 2. Lichen sclerosus. Uses Temovate 0.05% cream intermittently as needed to control irritative symptoms. Is doing well with this. Refill 1 year provided. 3. Osteopenia. DEXA 2013 T score -1.3 FRAX 10%/1.9%. Schedule DEXA now patient agrees to do so. Increase calcium vitamin D. 4. Mammography 09/2016.  Continue with annual mammography next year. SBE monthly reviewed. 5. Colonoscopy 2013. Repeat at their recommended interval. 6. Pap smear 2011. No Pap smear done today. Per current screening guidelines we both agree to stop screening based upon age and hysterectomy history. 7. Health maintenance. No routine lab work done as patient reports this done elsewhere. Follow up for bone density as scheduled otherwise 1 year, sooner as needed.    Anastasio Auerbach MD, 3:31 PM 10/01/2016

## 2016-10-04 NOTE — Telephone Encounter (Signed)
Done

## 2016-10-29 ENCOUNTER — Ambulatory Visit (INDEPENDENT_AMBULATORY_CARE_PROVIDER_SITE_OTHER): Payer: 59 | Admitting: Psychology

## 2016-10-29 DIAGNOSIS — F4321 Adjustment disorder with depressed mood: Secondary | ICD-10-CM

## 2016-11-07 ENCOUNTER — Encounter: Payer: Self-pay | Admitting: Internal Medicine

## 2016-11-07 ENCOUNTER — Ambulatory Visit (INDEPENDENT_AMBULATORY_CARE_PROVIDER_SITE_OTHER): Payer: Medicare Other | Admitting: Internal Medicine

## 2016-11-07 DIAGNOSIS — M654 Radial styloid tenosynovitis [de Quervain]: Secondary | ICD-10-CM | POA: Diagnosis not present

## 2016-11-07 MED ORDER — DICLOFENAC SODIUM 1 % TD GEL: 1.0000 "application " | Freq: Four times a day (QID) | TRANSDERMAL | 3 refills | Status: DC

## 2016-11-07 NOTE — Progress Notes (Signed)
Subjective:  Patient ID: Hailey [name], female    DOB: [birth year]  Age: 82 y.o. MRN: [id number]  CC: No chief complaint on file.   HPI WELDA SLIFER presents for L thumb pain x 2 - 3 weeks  Outpatient Medications Prior to Visit  Medication Sig Dispense Refill  . Acetaminophen (TYLENOL PO) Take 2 tablets by mouth daily as needed (pain).    Marland Kitchen apixaban (ELIQUIS) 5 MG TABS tablet Take 1 tablet (5 mg total) by mouth 2 (two) times daily. 180 tablet 1  . cetirizine (ZYRTEC) 10 MG tablet Take 10 mg by mouth daily as needed for allergies or rhinitis.     . clobetasol cream (TEMOVATE) 0.05 % APPLY NIGHTLY AS DIRECTED. 30 g 2  . diphenoxylate-atropine (LOMOTIL) 2.5-0.025 MG tablet Take 1 tablet by mouth 4 (four) times daily as needed for diarrhea or loose stools. 60 tablet 1  . DULoxetine (CYMBALTA) 60 MG capsule Take 60 mg by mouth daily.    Marland Kitchen esomeprazole (NEXIUM) 40 MG capsule Take 40 mg by mouth daily before breakfast.      . LORazepam (ATIVAN) 1 MG tablet TAKE (1) TABLET TWICE A DAY AS NEEDED. 180 tablet 0  . metoprolol succinate (TOPROL-XL) 50 MG 24 hr tablet TAKE 1 TABLET TWICE DAILY. 180 tablet 3  . pravastatin (PRAVACHOL) 20 MG tablet Take one (1) tablet (20 mg total) by mouth three (3) times a week. (Mondays, Wednesdays, and Fridays evenings) 39 tablet 1  . promethazine (PHENERGAN) 25 MG tablet Take 1 tablet (25 mg total) by mouth every 8 (eight) hours as needed for nausea or vomiting. 30 tablet 0  . sodium chloride (OCEAN) 0.65 % SOLN nasal spray Place 1 spray into both nostrils 2 (two) times daily as needed for congestion. 15 mL 0   No facility-administered medications prior to visit.     ROS Review of Systems  Constitutional: Negative for activity change, appetite change, chills, fatigue and unexpected weight change.  HENT: Negative for congestion, mouth sores and sinus pressure.   Eyes: Negative for visual disturbance.  Respiratory: Negative for cough and chest tightness.     Gastrointestinal: Positive for diarrhea. Negative for abdominal pain and nausea.  Genitourinary: Negative for difficulty urinating, frequency and vaginal pain.  Musculoskeletal: Positive for arthralgias. Negative for back pain and gait problem.  Skin: Negative for pallor and rash.  Neurological: Negative for dizziness, tremors, weakness, numbness and headaches.  Psychiatric/Behavioral: Negative for confusion and sleep disturbance.    Objective:  BP 120/72   Pulse (!) 51   Temp 97.8 F (36.6 C)   SpO2 98%   BP Readings from Last 3 Encounters:  11/07/16 120/72  10/01/16 124/76  09/13/16 120/82    Wt Readings from Last 3 Encounters:  10/01/16 172 lb (78 kg)  09/13/16 174 lb 6.4 oz (79.1 kg)  08/15/16 172 lb (78 kg)    Physical Exam  Constitutional: She appears well-developed. No distress.  HENT:  Head: Normocephalic.  Right Ear: External ear normal.  Left Ear: External ear normal.  Nose: Nose normal.  Mouth/Throat: Oropharynx is clear and moist.  Eyes: Conjunctivae are normal. Pupils are equal, round, and reactive to light. Right eye exhibits no discharge. Left eye exhibits no discharge.  Neck: Normal range of motion. Neck supple. No JVD present. No tracheal deviation present. No thyromegaly present.  Cardiovascular: Normal rate, regular rhythm and normal heart sounds.   Pulmonary/Chest: No stridor. No respiratory distress. She has no wheezes.  Abdominal: Soft.  Bowel sounds are normal. She exhibits no distension and no mass. There is no tenderness. There is no rebound and no guarding.  Musculoskeletal: She exhibits tenderness. She exhibits no edema.  Lymphadenopathy:    She has no cervical adenopathy.  Neurological: She displays normal reflexes. No cranial nerve deficit. She exhibits normal muscle tone. Coordination normal.  Skin: No rash noted. No erythema.  Psychiatric: She has a normal mood and affect. Her behavior is normal. Judgment and thought content normal.  L  thumb is tender by the base over extensor tendon  Lab Results  Component Value Date   WBC 6.5 09/13/2016   HGB 12.1 09/13/2016   HCT 35.5 (L) 09/13/2016   PLT 259.0 09/13/2016   GLUCOSE 97 09/13/2016   CHOL 153 07/04/2014   TRIG 97 07/04/2014   HDL 69 07/04/2014   LDLDIRECT 89.1 06/29/2013   LDLCALC 65 07/04/2014   ALT 9 09/13/2016   AST 18 09/13/2016   NA 138 09/13/2016   K 4.7 09/13/2016   CL 105 09/13/2016   CREATININE 1.16 09/13/2016   BUN 26 (H) 09/13/2016   CO2 28 09/13/2016   TSH 2.11 04/29/2015   INR 1.27 07/04/2014   HGBA1C 5.6 07/04/2014    Mm Screening Breast Tomo Bilateral  Result Date: 09/12/2016 CLINICAL DATA:  Screening. EXAM: 2D DIGITAL SCREENING BILATERAL MAMMOGRAM WITH CAD AND ADJUNCT TOMO COMPARISON:  Previous exam(s). ACR Breast Density Category c: The breast tissue is heterogeneously dense, which may obscure small masses. FINDINGS: There are no findings suspicious for malignancy. Images were processed with CAD. IMPRESSION: No mammographic evidence of malignancy. A result letter of this screening mammogram will be mailed directly to the patient. RECOMMENDATION: Screening mammogram in one year. (Code:SM-B-01Y) BI-RADS CATEGORY  1: Negative. Electronically Signed   By: Claudie Revering M.D.   On: 09/13/2016 11:00    Assessment & Plan:   There are no diagnoses linked to this encounter. I am having Ms. Weisel maintain her esomeprazole, cetirizine, apixaban, pravastatin, sodium chloride, Acetaminophen (TYLENOL PO), DULoxetine, metoprolol succinate, diphenoxylate-atropine, promethazine, LORazepam, and clobetasol cream.  No orders of the defined types were placed in this encounter.    Follow-up: No Follow-up on file.  Walker Kehr, MD

## 2016-11-07 NOTE — Progress Notes (Signed)
Pre visit review using our clinic review tool, if applicable. No additional management support is needed unless otherwise documented below in the visit note. 

## 2016-11-07 NOTE — Patient Instructions (Signed)
De Quervain Tenosynovitis Tendons attach muscles to bones. They also help with joint movements. When tendons become irritated or swollen, it is called tendinitis. The extensor pollicis brevis (EPB) tendon connects the EPB muscle to a bone that is near the base of the thumb. The EPB muscle helps to straighten and extend the thumb. De Quervain tenosynovitis is a condition in which the EPB tendon lining (sheath) becomes irritated, thickened, and swollen. This condition is sometimes called stenosing tenosynovitis. This condition causes pain on the thumb side of the back of the wrist. What are the causes? Causes of this condition include:  Activities that repeatedly cause your thumb and wrist to extend.  A sudden increase in activity or change in activity that affects your wrist. What increases the risk? This condition is more likely to develop in:  Females.  People who have diabetes.  Women who have recently given birth.  People who are over 40 years of age.  People who do activities that involve repeated hand and wrist motions, such as tennis, racquetball, volleyball, gardening, and taking care of children.  People who do heavy labor.  People who have poor wrist strength and flexibility.  People who do not warm up properly before activities. What are the signs or symptoms? Symptoms of this condition include:  Pain or tenderness over the thumb side of the back of the wrist when your thumb and wrist are not moving.  Pain that gets worse when you straighten your thumb or extend your thumb or wrist.  Pain when the injured area is touched.  Locking or catching of the thumb joint while you bend and straighten your thumb.  Decreased thumb motion due to pain.  Swelling over the affected area. How is this diagnosed? This condition is diagnosed with a medical history and physical exam. Your health care provider will ask for details about your injury and ask about your symptoms. How is  this treated? Treatment may include the use of icing and medicines to reduce pain and swelling. You may also be advised to wear a splint or brace to limit your thumb and wrist motion. In less severe cases, treatment may also include working with a physical therapist to strengthen your wrist and calm the irritation around your EPB tendon sheath. In severe cases, surgery may be needed. Follow these instructions at home: If you have a splint or brace:   Wear it as told by your health care provider. Remove it only as told by your health care provider.  Loosen the splint or brace if your fingers become numb and tingle, or if they turn cold and blue.  Keep the splint or brace clean and dry. Managing pain, stiffness, and swelling   If directed, apply ice to the injured area.  Put ice in a plastic bag.  Place a towel between your skin and the bag.  Leave the ice on for 20 minutes, 2-3 times per day.  Move your fingers often to avoid stiffness and to lessen swelling.  Raise (elevate) the injured area above the level of your heart while you are sitting or lying down. General instructions   Return to your normal activities as told by your health care provider. Ask your health care provider what activities are safe for you.  Take over-the-counter and prescription medicines only as told by your health care provider.  Keep all follow-up visits as told by your health care provider. This is important.  Do not drive or operate heavy machinery while taking   prescription pain medicine. Contact a health care provider if:  Your pain, tenderness, or swelling gets worse, even if you have had treatment.  You have numbness or tingling in your wrist, hand, or fingers on the injured side. This information is not intended to replace advice given to you by your health care provider. Make sure you discuss any questions you have with your health care provider. Document Released: 08/27/2005 Document Revised:  02/02/2016 Document Reviewed: 11/02/2014 Elsevier Interactive Patient Education  2017 [employer]  

## 2016-11-07 NOTE — Assessment & Plan Note (Addendum)
L side Discussed options incl injection Voltaren gel qid

## 2016-11-12 ENCOUNTER — Ambulatory Visit (INDEPENDENT_AMBULATORY_CARE_PROVIDER_SITE_OTHER): Payer: Medicare Other

## 2016-11-12 ENCOUNTER — Other Ambulatory Visit: Payer: Self-pay | Admitting: Gynecology

## 2016-11-12 DIAGNOSIS — Z780 Asymptomatic menopausal state: Secondary | ICD-10-CM

## 2016-11-12 DIAGNOSIS — M8589 Other specified disorders of bone density and structure, multiple sites: Secondary | ICD-10-CM

## 2016-11-12 DIAGNOSIS — M8580 Other specified disorders of bone density and structure, unspecified site: Secondary | ICD-10-CM

## 2016-11-16 ENCOUNTER — Telehealth: Payer: Self-pay | Admitting: Gynecology

## 2016-11-16 ENCOUNTER — Encounter: Payer: Self-pay | Admitting: Gynecology

## 2016-11-16 NOTE — Telephone Encounter (Signed)
Pt informed with the below note, will call back to schedule

## 2016-11-16 NOTE — Telephone Encounter (Signed)
Tell patient that her most recent bone density shows a higher fracture risk. It is in the range where treatment is recommended. I would recommend office visit to discuss treatment options.

## 2016-11-21 ENCOUNTER — Other Ambulatory Visit: Payer: Self-pay | Admitting: Interventional Cardiology

## 2016-11-21 DIAGNOSIS — R52 Pain, unspecified: Secondary | ICD-10-CM | POA: Diagnosis not present

## 2016-11-21 DIAGNOSIS — I2510 Atherosclerotic heart disease of native coronary artery without angina pectoris: Secondary | ICD-10-CM

## 2016-11-21 DIAGNOSIS — M25532 Pain in left wrist: Secondary | ICD-10-CM | POA: Diagnosis not present

## 2016-11-21 DIAGNOSIS — M180 Bilateral primary osteoarthritis of first carpometacarpal joints: Secondary | ICD-10-CM | POA: Diagnosis not present

## 2016-11-21 DIAGNOSIS — M79645 Pain in left finger(s): Secondary | ICD-10-CM | POA: Diagnosis not present

## 2016-11-22 ENCOUNTER — Ambulatory Visit: Payer: 59 | Admitting: Psychology

## 2016-11-22 NOTE — Telephone Encounter (Signed)
Spoke with pt and scheduled her for lipid panel on 11/26/16.  [place] for refill.

## 2016-11-22 NOTE — Telephone Encounter (Signed)
Please advise on refill request as patient does not have a recent lipid panel in epic. Thanks, MI 

## 2016-11-26 ENCOUNTER — Other Ambulatory Visit: Payer: Medicare Other | Admitting: *Deleted

## 2016-11-26 DIAGNOSIS — I2510 Atherosclerotic heart disease of native coronary artery without angina pectoris: Secondary | ICD-10-CM

## 2016-11-26 DIAGNOSIS — I10 Essential (primary) hypertension: Secondary | ICD-10-CM

## 2016-11-26 LAB — LIPID PANEL
Chol/HDL Ratio: 1.9 ratio units (ref 0.0–4.4)
Cholesterol, Total: 171 mg/dL (ref 100–199)
HDL: 90 mg/dL (ref >39)
LDL Calculated: 61 mg/dL (ref 0–99)
Triglycerides: 99 mg/dL (ref 0–149)
VLDL Cholesterol Cal: 20 mg/dL (ref 5–40)

## 2016-12-03 ENCOUNTER — Encounter: Payer: Self-pay | Admitting: Family

## 2016-12-03 ENCOUNTER — Ambulatory Visit (INDEPENDENT_AMBULATORY_CARE_PROVIDER_SITE_OTHER): Payer: Medicare Other | Admitting: Family

## 2016-12-03 VITALS — BP 138/88 | HR 56 | Temp 98.1°F | Resp 16 | Ht 63.0 in | Wt 174.8 lb

## 2016-12-03 DIAGNOSIS — J069 Acute upper respiratory infection, unspecified: Secondary | ICD-10-CM

## 2016-12-03 NOTE — Progress Notes (Signed)
Subjective:    Patient ID: Hailey [name], female    DOB: December 03, 1934, 82 y.o.   MRN: [id number]  Chief Complaint  Patient presents with  . Cough    cough and congestion, husband had pneumonia not long ago     HPI:  Hailey [name] is a 82 y.o. female who  has a past medical history of Allergic rhinitis; Anxiety; Atrial fibrillation ([place]); Diverticulosis of colon; GERD (gastroesophageal reflux disease); Hip bursitis (2010); History of colon polyps; HTN (hypertension); IBS (irritable bowel syndrome); Lichen sclerosus; Osteopenia (11/2016); PAC (premature atrial contraction); and SVT (supraventricular tachycardia) ([place]). and presents today for an acute office visit.  This is a new problem. Associated symptoms of cough, congestion, and sore throat have been going on for about 3 days. Overall feeling a little bit better today. Denies fevers. Modifying factors include Tylenol and saline solution which have helped a little with her symptoms. She has also taken Tessalon perals which really helped. No recent antibiotics. Husband recently had pneumonia.   Allergies  Allergen Reactions  . Macrobid [employer] Macro] Other (See Comments)    Nausea, stomach cramps, fatigue , headache.  . Meloxicam Other (See Comments)    Jittery and headache  . Digoxin And Related     headaches  . Sulfamethoxazole-Trimethoprim Nausea Only  . Tramadol Other (See Comments)    Feels like she has a hangover      Outpatient Medications Prior to Visit  Medication Sig Dispense Refill  . Acetaminophen (TYLENOL PO) Take 2 tablets by mouth daily as needed (pain).    Marland Kitchen apixaban (ELIQUIS) 5 MG TABS tablet Take 1 tablet (5 mg total) by mouth 2 (two) times daily. 180 tablet 1  . cetirizine (ZYRTEC) 10 MG tablet Take 10 mg by mouth daily as needed for allergies or rhinitis.     . clobetasol cream (TEMOVATE) 0.05 % APPLY NIGHTLY AS DIRECTED. 30 g 2  . diclofenac sodium (VOLTAREN) 1 % GEL Apply 1 application  topically 4 (four) times daily. 100 g 3  . diphenoxylate-atropine (LOMOTIL) 2.5-0.025 MG tablet Take 1 tablet by mouth 4 (four) times daily as needed for diarrhea or loose stools. 60 tablet 1  . DULoxetine (CYMBALTA) 60 MG capsule Take 60 mg by mouth daily.    Marland Kitchen esomeprazole (NEXIUM) 40 MG capsule Take 40 mg by mouth daily before breakfast.      . LORazepam (ATIVAN) 1 MG tablet TAKE (1) TABLET TWICE A DAY AS NEEDED. 180 tablet 0  . metoprolol succinate (TOPROL-XL) 50 MG 24 hr tablet TAKE 1 TABLET TWICE DAILY. 180 tablet 3  . pravastatin (PRAVACHOL) 20 MG tablet Take 1 tablet by mouth on Monday, Wednesday and Friday evenings. 39 tablet 2  . promethazine (PHENERGAN) 25 MG tablet Take 1 tablet (25 mg total) by mouth every 8 (eight) hours as needed for nausea or vomiting. 30 tablet 0  . sodium chloride (OCEAN) 0.65 % SOLN nasal spray Place 1 spray into both nostrils 2 (two) times daily as needed for congestion. 15 mL 0   No facility-administered medications prior to visit.      Review of Systems  Constitutional: Negative for chills and fever.  HENT: Positive for congestion and sore throat. Negative for sinus pain, sinus pressure and sneezing.   Respiratory: Positive for cough and wheezing. Negative for chest tightness.   Neurological: Negative for headaches.      Objective:    BP 138/88 (BP Location: Left Arm, Patient Position: Sitting, Cuff Size:  Normal)   Pulse (!) 56   Temp 98.1 F (36.7 C) (Oral)   Resp 16   Ht 5\' 3"  (1.6 m)   Wt 174 lb 12.8 oz (79.3 kg)   SpO2 92%   BMI 30.96 kg/m  Nursing note and vital signs reviewed.  Physical Exam  Constitutional: She is oriented to person, place, and time. She appears well-developed and well-nourished. No distress.  HENT:  Right Ear: Hearing, tympanic membrane, external ear and ear canal normal.  Left Ear: Hearing, tympanic membrane, external ear and ear canal normal.  Nose: Nose normal. Right sinus exhibits no maxillary sinus tenderness  and no frontal sinus tenderness. Left sinus exhibits no maxillary sinus tenderness and no frontal sinus tenderness.  Mouth/Throat: Uvula is midline, oropharynx is clear and moist and mucous membranes are normal.  Neck: Neck supple.  Cardiovascular: Normal rate, regular rhythm, normal heart sounds and intact distal pulses.   Pulmonary/Chest: Effort normal and breath sounds normal.  Lymphadenopathy:    She has no cervical adenopathy.  Neurological: She is alert and oriented to person, place, and time.  Skin: Skin is warm and dry.  Psychiatric: She has a normal mood and affect. Her behavior is normal. Judgment and thought content normal.       Assessment & Plan:   Problem List Items Addressed This Visit      Respiratory   Acute upper respiratory infection - Primary    Symptoms and exam consistent with acute upper respiratory infection most likely viral given recent improvement. Continue over-the-counter medications as needed for symptom relief and supportive care. Follow-up if symptoms worsen or do not improve.          I am having Ms. Huckeby maintain her esomeprazole, cetirizine, apixaban, sodium chloride, Acetaminophen (TYLENOL PO), DULoxetine, metoprolol succinate, diphenoxylate-atropine, promethazine, LORazepam, clobetasol cream, diclofenac sodium, and pravastatin.   Follow-up: Return if symptoms worsen or fail to improve.  Mauricio Po, FNP

## 2016-12-03 NOTE — Assessment & Plan Note (Signed)
Symptoms and exam consistent with acute upper respiratory infection most likely viral given recent improvement. Continue over-the-counter medications as needed for symptom relief and supportive care. Follow-up if symptoms worsen or do not improve.

## 2016-12-03 NOTE — Patient Instructions (Signed)
Thank you for choosing [facility].  SUMMARY AND INSTRUCTIONS:  Medication:  Continue with over the counter medications as needed.  Follow up:  If your symptoms worsen or fail to improve, please contact our office for further instruction, or in case of emergency go directly to the emergency room at the closest medical facility.   General Recommendations:    Please drink plenty of fluids.  Get plenty of rest   Sleep in humidified air  Use saline nasal sprays  Netti pot   OTC Medications:  Decongestants - helps relieve congestion   Flonase (generic fluticasone) or Nasacort (generic triamcinolone) - please make sure to use the "cross-over" technique at a 45 degree angle towards the opposite eye as opposed to straight up the nasal passageway.   Sudafed (generic pseudoephedrine - Note this is the one that is available behind the pharmacy counter); Products with phenylephrine (-PE) may also be used but is often not as effective as pseudoephedrine.   If you have HIGH BLOOD PRESSURE - Coricidin HBP; AVOID any product that is -D as this contains pseudoephedrine which may increase your blood pressure.  Afrin (oxymetazoline) every 6-8 hours for up to 3 days.   Allergies - helps relieve runny nose, itchy eyes and sneezing   Claritin (generic loratidine), Allegra (fexofenidine), or Zyrtec (generic cyrterizine) for runny nose. These medications should not cause drowsiness.  Note - Benadryl (generic diphenhydramine) may be used however may cause drowsiness  Cough -   Delsym or Robitussin (generic dextromethorphan)  Expectorants - helps loosen mucus to ease removal   Mucinex (generic guaifenesin) as directed on the package.  Headaches / General Aches   Tylenol (generic acetaminophen) - DO NOT EXCEED 3 grams (3,000 mg) in a 24 hour time period  Advil/Motrin (generic ibuprofen)   Sore Throat -   Salt water gargle   Chloraseptic (generic benzocaine) spray or  lozenges / Sucrets (generic dyclonine)     Upper Respiratory Infection, Adult Most upper respiratory infections (URIs) are caused by a virus. A URI affects the nose, throat, and upper air passages. The most common type of URI is often called "the common cold." Follow these instructions at home:  Take medicines only as told by your doctor.  Gargle warm saltwater or take cough drops to comfort your throat as told by your doctor.  Use a warm mist humidifier or inhale steam from a shower to increase air moisture. This may make it easier to breathe.  Drink enough fluid to keep your pee (urine) clear or pale yellow.  Eat soups and other clear broths.  Have a healthy diet.  Rest as needed.  Go back to work when your fever is gone or your doctor says it is okay.  You may need to stay home longer to avoid giving your URI to others.  You can also wear a face mask and wash your hands often to prevent spread of the virus.  Use your inhaler more if you have asthma.  Do not use any tobacco products, including cigarettes, chewing tobacco, or electronic cigarettes. If you need help quitting, ask your doctor. Contact a doctor if:  You are getting worse, not better.  Your symptoms are not helped by medicine.  You have chills.  You are getting more short of breath.  You have brown or red mucus.  You have yellow or brown discharge from your nose.  You have pain in your face, especially when you bend forward.  You have a fever.  You have puffy (swollen) neck glands.  You have pain while swallowing.  You have white areas in the back of your throat. Get help right away if:  You have very bad or constant:  Headache.  Ear pain.  Pain in your forehead, behind your eyes, and over your cheekbones (sinus pain).  Chest pain.  You have long-lasting (chronic) lung disease and any of the following:  Wheezing.  Long-lasting cough.  Coughing up blood.  A change in your usual  mucus.  You have a stiff neck.  You have changes in your:  Vision.  Hearing.  Thinking.  Mood. This information is not intended to replace advice given to you by your health care provider. Make sure you discuss any questions you have with your health care provider. Document Released: 02/13/2008 Document Revised: 04/29/2016 Document Reviewed: 12/02/2013 Elsevier Interactive Patient Education  2017 [employer].

## 2016-12-10 ENCOUNTER — Other Ambulatory Visit: Payer: Self-pay | Admitting: Interventional Cardiology

## 2016-12-10 NOTE — Telephone Encounter (Signed)
Pt last saw Dr Tamala Julian on 08/01/16, last labs on 09/13/16 Creat 1.16, age 82, weight 79.3kg, based on specified criteria pt is on appropriate Eliquis dosage 5mg  BID.  Will refill rx.

## 2016-12-12 ENCOUNTER — Ambulatory Visit (INDEPENDENT_AMBULATORY_CARE_PROVIDER_SITE_OTHER): Payer: Medicare Other | Admitting: Gynecology

## 2016-12-12 ENCOUNTER — Encounter: Payer: Self-pay | Admitting: Gynecology

## 2016-12-12 VITALS — BP 116/76

## 2016-12-12 DIAGNOSIS — M8580 Other specified disorders of bone density and structure, unspecified site: Secondary | ICD-10-CM

## 2016-12-12 NOTE — Patient Instructions (Signed)
Follow up for vitamin D results.

## 2016-12-12 NOTE — Progress Notes (Signed)
    KHANH CORDNER October 27, 1934 [id number]        82 y.o.  G2P2002 presents to discuss her most recent bone density which showed a T score -2.0 right femoral neck. Appears to have loss of the total left and total right hip is prior 2013 study of 9-11%. FRAX calculated with 10 year probability of fracture 15% overall and 4.3% hip. Exercises on a regular basis with trainer.  Past medical history,surgical history, problem list, medications, allergies, family history and social history were all reviewed and documented in the EPIC chart.  Directed ROS with pertinent positives and negatives documented in the history of present illness/assessment and plan.  Exam: Vitals:   12/12/16 1502  BP: 116/76   General appearance:  Normal  Assessment/Plan:  82 y.o. G2P2002 With osteopenia and some loss from her prior study. FRAX she has elevated hip fracture risk of 4% exceeding the recommended treatment upper limits of 3%. I reviewed treatment options. Oral bisphosphonates, IV bisphosphonates, Prolia, Evista. Does have a history of GERD and episodes of diverticulitis. Also anticoagulated with Eliquis for her A. fib. Risks of treatment to include GERD, atypical fractures, osteonecrosis of the jaw, rashes and increased risk of infections all reviewed. After a lengthy discussion of the risks versus benefits the patient prefers not to be treated at this time but to continue with weightbearing exercise. We'll check vitamin D level today. Will follow up with repeat DEXA in 2 years.    Anastasio Auerbach MD, 4:07 PM 12/12/2016

## 2016-12-13 LAB — VITAMIN D 25 HYDROXY (VIT D DEFICIENCY, FRACTURES): Vit D, 25-Hydroxy: 32 ng/mL (ref 30–100)

## 2016-12-16 ENCOUNTER — Other Ambulatory Visit: Payer: Self-pay | Admitting: Internal Medicine

## 2016-12-18 ENCOUNTER — Telehealth: Payer: Self-pay | Admitting: Interventional Cardiology

## 2016-12-18 NOTE — Telephone Encounter (Signed)
New Message;    Please call,question about her Metoprolol.

## 2016-12-18 NOTE — Telephone Encounter (Signed)
Spoke with pt and she wanted to check the dosage of her Metoprolol.  Advised her we have her down for Metoprolol Succinate 50mg  BID.  Pt was unsure of when change was made and recalled being on 25mg  BID.  Advised this change was made in 09/2015.  Pt verbalized understanding and was appreciative for assistance.

## 2016-12-25 ENCOUNTER — Ambulatory Visit: Payer: 59 | Admitting: Psychology

## 2017-01-02 DIAGNOSIS — M180 Bilateral primary osteoarthritis of first carpometacarpal joints: Secondary | ICD-10-CM | POA: Insufficient documentation

## 2017-01-16 ENCOUNTER — Ambulatory Visit (INDEPENDENT_AMBULATORY_CARE_PROVIDER_SITE_OTHER): Payer: 59 | Admitting: Psychology

## 2017-01-16 DIAGNOSIS — F4321 Adjustment disorder with depressed mood: Secondary | ICD-10-CM | POA: Diagnosis not present

## 2017-01-22 DIAGNOSIS — Z800 Family history of malignant neoplasm of digestive organs: Secondary | ICD-10-CM | POA: Diagnosis not present

## 2017-01-22 DIAGNOSIS — K219 Gastro-esophageal reflux disease without esophagitis: Secondary | ICD-10-CM | POA: Diagnosis not present

## 2017-01-22 DIAGNOSIS — Z79899 Other long term (current) drug therapy: Secondary | ICD-10-CM | POA: Diagnosis not present

## 2017-02-06 ENCOUNTER — Ambulatory Visit (INDEPENDENT_AMBULATORY_CARE_PROVIDER_SITE_OTHER): Payer: Medicare Other | Admitting: Gynecology

## 2017-02-06 ENCOUNTER — Encounter: Payer: Self-pay | Admitting: Gynecology

## 2017-02-06 VITALS — BP 120/78

## 2017-02-06 DIAGNOSIS — L900 Lichen sclerosus et atrophicus: Secondary | ICD-10-CM | POA: Diagnosis not present

## 2017-02-06 DIAGNOSIS — N9089 Other specified noninflammatory disorders of vulva and perineum: Secondary | ICD-10-CM | POA: Diagnosis not present

## 2017-02-06 LAB — WET PREP FOR TRICH, YEAST, CLUE
Clue Cells Wet Prep HPF POC: NONE SEEN
Trich, Wet Prep: NONE SEEN
WBC, Wet Prep HPF POC: NONE SEEN
Yeast Wet Prep HPF POC: NONE SEEN

## 2017-02-06 LAB — URINALYSIS W MICROSCOPIC + REFLEX CULTURE
Bilirubin Urine: NEGATIVE
Casts: NONE SEEN [LPF]
Crystals: NONE SEEN [HPF]
Glucose, UA: NEGATIVE
Ketones, ur: NEGATIVE
Nitrite: POSITIVE — AB
Protein, ur: NEGATIVE
Specific Gravity, Urine: 1.015 (ref 1.001–1.035)
Yeast: NONE SEEN [HPF]
pH: 5.5 (ref 5.0–8.0)

## 2017-02-06 MED ORDER — TERCONAZOLE 0.4 % VA CREA: 1.0000 | TOPICAL_CREAM | Freq: Every day | VAGINAL | 0 refills | Status: DC

## 2017-02-06 MED ORDER — CLOBETASOL PROPIONATE 0.05 % EX CREA: TOPICAL_CREAM | CUTANEOUS | 2 refills | Status: DC

## 2017-02-06 NOTE — Progress Notes (Signed)
    Hailey [name] 18-Aug-1935 [id number]        82 y.o.  G2P2002 presents with a several week history of vulvar irritation. No significant discharge or odor. No urinary symptoms such as frequency dysuria or urgency low back pain fever or chills. She does have a history of lichen sclerosis using Temovate 0.05% cream intermittently and has used it recently. She says it feels different than her lichen sclerosus irritation.  Past medical history,surgical history, problem list, medications, allergies, family history and social history were all reviewed and documented in the EPIC chart.  Directed ROS with pertinent positives and negatives documented in the history of present illness/assessment and plan.  Exam: [name] assistant Vitals:   02/06/17 1108  BP: 120/78   General appearance:  Normal Abdomen soft nontender without masses guarding rebound Pelvic external BUS vagina with peri-introital blanching consistent with history of lichen sclerosis. Also with some mild erythematous inflammatory changes bilaterally on the labia majora. No specific lesions. Vagina without discharge or irritation. Bimanual without masses or tenderness   Assessment/Plan:  82 y.o. [id number]  with history and exam as above. Wet prep is negative. Will treat as a yeast based on history. Reviewed with patient difficult to differentiate in patients with lichen sclerosus as to how much to attribute to the lichen sclerosus versus other etiologies for all or irritation. Will use Terazol 7 day cream internal/external. We'll continue with Temovate 0.05% cream as needed. Will follow up if symptoms persist, worsen or recur. Will also check baseline urinalysis to make sure that she does not have a UTI as sometimes symptoms can be confusing.    Anastasio Auerbach MD, 11:30 AM 02/06/2017

## 2017-02-06 NOTE — Patient Instructions (Signed)
Use the Terazol vaginal cream nightly 7 nights. Follow up if the symptoms persist, worsen or recur.

## 2017-02-07 LAB — URINE CULTURE

## 2017-02-08 ENCOUNTER — Other Ambulatory Visit: Payer: Self-pay | Admitting: Gynecology

## 2017-02-08 DIAGNOSIS — R311 Benign essential microscopic hematuria: Secondary | ICD-10-CM

## 2017-02-19 ENCOUNTER — Other Ambulatory Visit: Payer: Self-pay | Admitting: Internal Medicine

## 2017-02-19 ENCOUNTER — Other Ambulatory Visit: Payer: Medicare Other

## 2017-02-19 ENCOUNTER — Other Ambulatory Visit: Payer: Self-pay | Admitting: Gynecology

## 2017-02-19 DIAGNOSIS — R311 Benign essential microscopic hematuria: Secondary | ICD-10-CM

## 2017-02-20 LAB — URINALYSIS W MICROSCOPIC + REFLEX CULTURE
Bilirubin Urine: NEGATIVE
Casts: NONE SEEN [LPF]
Crystals: NONE SEEN [HPF]
Glucose, UA: NEGATIVE
Ketones, ur: NEGATIVE
Nitrite: POSITIVE — AB
Protein, ur: NEGATIVE
Specific Gravity, Urine: 1.02 (ref 1.001–1.035)
Yeast: NONE SEEN [HPF]
pH: 5.5 (ref 5.0–8.0)

## 2017-02-22 LAB — URINE CULTURE

## 2017-02-22 NOTE — Telephone Encounter (Signed)
Called refill into [facility] had to leave on pharmacy vm...Hailey [name]

## 2017-02-25 ENCOUNTER — Other Ambulatory Visit: Payer: Self-pay | Admitting: *Deleted

## 2017-02-25 MED ORDER — CIPROFLOXACIN HCL 250 MG PO TABS: 250.0000 mg | ORAL_TABLET | Freq: Two times a day (BID) | ORAL | 0 refills | Status: DC

## 2017-03-07 DIAGNOSIS — M79604 Pain in right leg: Secondary | ICD-10-CM | POA: Diagnosis not present

## 2017-03-18 DIAGNOSIS — M546 Pain in thoracic spine: Secondary | ICD-10-CM | POA: Diagnosis not present

## 2017-03-18 DIAGNOSIS — M5136 Other intervertebral disc degeneration, lumbar region: Secondary | ICD-10-CM | POA: Diagnosis not present

## 2017-03-18 DIAGNOSIS — M47816 Spondylosis without myelopathy or radiculopathy, lumbar region: Secondary | ICD-10-CM | POA: Diagnosis not present

## 2017-03-18 DIAGNOSIS — M48062 Spinal stenosis, lumbar region with neurogenic claudication: Secondary | ICD-10-CM | POA: Diagnosis not present

## 2017-03-18 DIAGNOSIS — Q763 Congenital scoliosis due to congenital bony malformation: Secondary | ICD-10-CM | POA: Diagnosis not present

## 2017-03-19 ENCOUNTER — Other Ambulatory Visit: Payer: Self-pay | Admitting: Family Medicine

## 2017-03-20 ENCOUNTER — Telehealth: Payer: Self-pay | Admitting: Pharmacist

## 2017-03-20 DIAGNOSIS — M47816 Spondylosis without myelopathy or radiculopathy, lumbar region: Secondary | ICD-10-CM | POA: Diagnosis not present

## 2017-03-20 DIAGNOSIS — M4726 Other spondylosis with radiculopathy, lumbar region: Secondary | ICD-10-CM | POA: Diagnosis not present

## 2017-03-20 NOTE — Telephone Encounter (Signed)
Received fax from [facility] that pt is scheduled for an epidural spinal injection with request to hold Eliquis. Pt takes Eliquis for afib with no hx of CVA. Ok to hold for 3 days prior to procedure per protocol. Clearance faxed to [phone].

## 2017-03-28 DIAGNOSIS — M5440 Lumbago with sciatica, unspecified side: Secondary | ICD-10-CM | POA: Diagnosis not present

## 2017-03-28 DIAGNOSIS — M4155 Other secondary scoliosis, thoracolumbar region: Secondary | ICD-10-CM | POA: Diagnosis not present

## 2017-03-28 DIAGNOSIS — M5126 Other intervertebral disc displacement, lumbar region: Secondary | ICD-10-CM | POA: Diagnosis not present

## 2017-03-28 DIAGNOSIS — M4726 Other spondylosis with radiculopathy, lumbar region: Secondary | ICD-10-CM | POA: Diagnosis not present

## 2017-03-28 DIAGNOSIS — M5416 Radiculopathy, lumbar region: Secondary | ICD-10-CM | POA: Diagnosis not present

## 2017-04-04 ENCOUNTER — Ambulatory Visit (INDEPENDENT_AMBULATORY_CARE_PROVIDER_SITE_OTHER): Payer: Medicare Other | Admitting: Sports Medicine

## 2017-04-04 DIAGNOSIS — M5137 Other intervertebral disc degeneration, lumbosacral region: Secondary | ICD-10-CM | POA: Diagnosis not present

## 2017-04-04 MED ORDER — TRAMADOL HCL 50 MG PO TABS: 50.0000 mg | ORAL_TABLET | Freq: Two times a day (BID) | ORAL | 2 refills | Status: DC

## 2017-04-04 NOTE — Progress Notes (Addendum)
CC;  Herniated lumbar disk with radiculopathy

## 2017-04-25 ENCOUNTER — Ambulatory Visit (INDEPENDENT_AMBULATORY_CARE_PROVIDER_SITE_OTHER): Payer: Medicare Other | Admitting: Sports Medicine

## 2017-04-25 ENCOUNTER — Ambulatory Visit: Payer: Self-pay | Admitting: Sports Medicine

## 2017-04-25 DIAGNOSIS — M5106 Intervertebral disc disorders with myelopathy, lumbar region: Secondary | ICD-10-CM | POA: Diagnosis not present

## 2017-04-25 NOTE — Assessment & Plan Note (Signed)
Known DDD lumbar  New herniated disc by MRI  I think conservative care is reasonable  Tramadol bid OTC meds prn Cont cymbalta Walking and rest

## 2017-04-25 NOTE — Assessment & Plan Note (Addendum)
I modified her med schedule cymbalta to morning Tylenol in PM since tramadol causing some GI upset Tramadol in AM seems OK Start easy motin exercises for back I think she is a good candidate to cont cons care Reck 4 weeks

## 2017-04-25 NOTE — Progress Notes (Signed)
   Subjective:    Patient ID: Hailey [name], female    DOB: 19-Mar-1935, 82 y.o.   MRN: [id number]  HPI  3 week f/u ruptured lumbar disc Now doing better Can walk up steps Not as unstable feeling Back pain has localized to lumbar area Less radiation down leg    Review of Systems No bowel or bladder sxs No numbness infoot or leg    Objective:   Physical Exam  Pleasant older F in NAD BP 117/84   Ht 5\' 4"  (1.626 m)   Wt 170 lb (77.1 kg)   BMI 29.18 kg/m   Heel and toe walk OK Good strength on foot testing Now able to do SLR to 70 deg without pain (improved) No sensory change Able to stand on 1 foot Reflexes improved and are 2+      Assessment & Plan:

## 2017-04-25 NOTE — Patient Instructions (Addendum)
Tramadol in morning for pain Since afternoon does bothers your stomach let's use tylenol in afternoon 1 or 2 tabs You can take tylenol 3 or 4 times a day if you needed  Move Cymbalta to morning Start using some heat over your low back Tera wrap may be helpful  Rest is helpful  Short walks are helpful  Easy motion back  Your exam today does show some improved muscle function and better relfexes

## 2017-05-03 DIAGNOSIS — M5136 Other intervertebral disc degeneration, lumbar region: Secondary | ICD-10-CM | POA: Diagnosis not present

## 2017-05-03 DIAGNOSIS — M25552 Pain in left hip: Secondary | ICD-10-CM | POA: Diagnosis not present

## 2017-05-03 DIAGNOSIS — M25551 Pain in right hip: Secondary | ICD-10-CM | POA: Diagnosis not present

## 2017-05-03 DIAGNOSIS — M48062 Spinal stenosis, lumbar region with neurogenic claudication: Secondary | ICD-10-CM | POA: Diagnosis not present

## 2017-05-03 DIAGNOSIS — M4155 Other secondary scoliosis, thoracolumbar region: Secondary | ICD-10-CM | POA: Diagnosis not present

## 2017-05-06 ENCOUNTER — Other Ambulatory Visit: Payer: Self-pay | Admitting: Internal Medicine

## 2017-05-08 ENCOUNTER — Telehealth: Payer: Self-pay | Admitting: Internal Medicine

## 2017-05-08 DIAGNOSIS — M4726 Other spondylosis with radiculopathy, lumbar region: Secondary | ICD-10-CM | POA: Diagnosis not present

## 2017-05-08 DIAGNOSIS — M48061 Spinal stenosis, lumbar region without neurogenic claudication: Secondary | ICD-10-CM | POA: Diagnosis not present

## 2017-05-08 DIAGNOSIS — M5136 Other intervertebral disc degeneration, lumbar region: Secondary | ICD-10-CM | POA: Diagnosis not present

## 2017-05-08 MED ORDER — LINACLOTIDE 290 MCG PO CAPS: 290.0000 ug | ORAL_CAPSULE | Freq: Every day | ORAL | 5 refills | Status: DC | PRN

## 2017-05-08 NOTE — Telephone Encounter (Signed)
Called pt and informed her of response she said since the phone call she has had a bowel movement. She feels better but is going to still pick up the medication.

## 2017-05-08 NOTE — Telephone Encounter (Signed)
Pt called stating she has been constipated all day. She has tried an enema and that did not do anything. She said she has strained so much she is now bleeding out of her rectum. She is wondering is her pain medication could be doing this. Please call back

## 2017-05-08 NOTE — Telephone Encounter (Signed)
Yes, it could be I'll email a Linzess Rx one a day prn. Can take it with 1-3 scoops of Miralax if needed Thx

## 2017-05-09 ENCOUNTER — Ambulatory Visit: Payer: Self-pay | Admitting: Sports Medicine

## 2017-05-25 ENCOUNTER — Other Ambulatory Visit: Payer: Self-pay | Admitting: Interventional Cardiology

## 2017-05-28 ENCOUNTER — Ambulatory Visit (INDEPENDENT_AMBULATORY_CARE_PROVIDER_SITE_OTHER): Payer: Medicare Other | Admitting: Sports Medicine

## 2017-05-28 DIAGNOSIS — M5137 Other intervertebral disc degeneration, lumbosacral region: Secondary | ICD-10-CM | POA: Diagnosis not present

## 2017-05-28 NOTE — Assessment & Plan Note (Signed)
She is now 8 to 9 weeks after disc rupture  Now returning to all ADLs  Started workouts in gym  I think she is doing quite well Not needing tramadol Dr Sherwood Gambler has her on low dose gabapentin and she will cont this and other meds  See me in 2 mos

## 2017-05-28 NOTE — Progress Notes (Signed)
CC: ruptured Disc   Patient was first seen for ruptured disc 7/26 Had seen Dr Sherwood Gambler and confirmed on MRI - low lumbar Was doing well with conservative care when seen 8/16 One week later had a flare with sharp pain in both hips  Saw Dr Sherwood Gambler and CSI to spine gave good pain relierf Able to get back to easy exercise and normal walking This past week able to get back to some exercises with trainer at Gym  Pain level today is 1 to 2/10  ROS No bowel or bladder problems No numbness Leg strength seems good  PE Pleasant F in NAD BP 140/83   Ht 5\' 4"  (1.626 m)   Wt 170 lb (77.1 kg)   BMI 29.18 kg/m   Full flexion - no pain Extension now to 30 deg no pain (was limited to 10) Full lat bend and rotation no pain  Heel, toe and tandem walk were normal  Strength testing: 5/5  hip flexion Quadriceps Intrinsic MM foot

## 2017-06-11 NOTE — Progress Notes (Signed)
Cardiology Office Note    Date:  06/12/2017   ID:  Hailey [name], DOB [birth year], MRN [id number]  PCP:  Cassandria Anger, MD  Cardiologist: Sinclair Grooms, MD   Chief Complaint  Patient presents with  . Atrial Fibrillation    History of Present Illness:  Hailey [name] is a 82 y.o. female who presents for PSVT, paroxysmal atrial fibrillation, chronic anticoagulation, and hypertension.  2-3 week history of extreme fatigue, lightheadedness, and weakness.She feels her arms been beating faster than usual. She denies chest pain. There is some exertional dyspnea. She has been under extreme emotional stress since the summer because of the death of her son.   Past Medical History:  Diagnosis Date  . Allergic rhinitis   . Anxiety   . Atrial fibrillation ([place])   . Diverticulosis of colon   . GERD (gastroesophageal reflux disease)   . Hip bursitis 2010   Dr Para March, Post op seroma  . History of colon polyps   . HTN (hypertension)   . IBS (irritable bowel syndrome)    constipation predominant - Dr Earlean Shawl  . Lichen sclerosus   . Osteopenia 11/2016   T score -2.0 FRAX 15%/4.3%  . PAC (premature atrial contraction)    Symptomatiic  . SVT (supraventricular tachycardia) ([place])    brief history    Past Surgical History:  Procedure Laterality Date  . APPENDECTOMY    . BLADDER SURGERY    . BREAST REDUCTION SURGERY    . OOPHORECTOMY     BSO  . TOE SURGERY    . Troch Bursa Resection  2010  . VAGINAL HYSTERECTOMY     BSO    Current Medications: Outpatient Medications Prior to Visit  Medication Sig Dispense Refill  . Acetaminophen (TYLENOL PO) Take 2 tablets by mouth daily as needed (pain).    . cetirizine (ZYRTEC) 10 MG tablet Take 10 mg by mouth daily as needed for allergies or rhinitis.     . clobetasol cream (TEMOVATE) 0.05 % APPLY NIGHTLY AS DIRECTED. 30 g 2  . diclofenac sodium (VOLTAREN) 1 % GEL APPLY 2 GRAMS TOPICALLY 4 TIMES DAILY. 100 g 0  .  diphenoxylate-atropine (LOMOTIL) 2.5-0.025 MG tablet Take 1 tablet by mouth 4 (four) times daily as needed for diarrhea or loose stools. 60 tablet 1  . DULoxetine (CYMBALTA) 60 MG capsule TAKE (1) CAPSULE DAILY. 90 capsule 1  . ELIQUIS 5 MG TABS tablet TAKE 1 TABLET TWICE DAILY. 180 tablet 2  . esomeprazole (NEXIUM) 40 MG capsule Take 40 mg by mouth daily before breakfast.      . linaclotide (LINZESS) 290 MCG CAPS capsule Take 1 capsule (290 mcg total) by mouth daily as needed. 30 capsule 5  . LORazepam (ATIVAN) 1 MG tablet TAKE (1) TABLET TWICE A DAY AS NEEDED. 180 tablet 0  . metoprolol succinate (TOPROL-XL) 50 MG 24 hr tablet TAKE 1 TABLET TWICE DAILY. 180 tablet 3  . pravastatin (PRAVACHOL) 20 MG tablet Take 1 tablet by mouth on Monday, Wednesday and Friday evenings. 39 tablet 2  . promethazine (PHENERGAN) 25 MG tablet Take 1 tablet (25 mg total) by mouth every 8 (eight) hours as needed for nausea or vomiting. 30 tablet 0  . sodium chloride (OCEAN) 0.65 % SOLN nasal spray Place 1 spray into both nostrils 2 (two) times daily as needed for congestion. 15 mL 0  . ciprofloxacin (CIPRO) 250 MG tablet Take 1 tablet (250 mg total) by mouth 2 (two) times daily. (  Patient not taking: Reported on 04/04/2017) 6 tablet 0  . terconazole (TERAZOL 7) 0.4 % vaginal cream Place 1 applicator vaginally at bedtime. For 7 nights (Patient not taking: Reported on 04/04/2017) 45 g 0  . traMADol (ULTRAM) 50 MG tablet Take 1 tablet (50 mg total) by mouth 2 (two) times daily. (Patient not taking: Reported on 06/12/2017) 60 tablet 2   No facility-administered medications prior to visit.      Allergies:   Macrobid [nitrofurantoin monohyd macro]; Meloxicam; Digoxin and related; Sulfamethoxazole-trimethoprim; and Tramadol   Social History   Social History  . Marital status: Married    Spouse name: N/A  . Number of children: 2  . Years of education: N/A   Occupational History  . Travel Agent Retired   Social History  Main Topics  . Smoking status: Former Smoker    Quit date: 09/11/1975  . Smokeless tobacco: Never Used  . Alcohol use 4.2 oz/week    7 Standard drinks or equivalent per week  . Drug use: No  . Sexual activity: Yes    Birth control/ protection: Surgical     Comment: 1st intercourse 82 yo--Fewer than 5 partners   Other Topics Concern  . None   Social History Narrative   Regular Exercise -  YES           Family History:  The patient's family history includes Cancer in her brother, father, and son; Colon cancer in her mother; Diabetes in her father.   ROS:   Please see the history of present illness.    Easy bruising on anticoagulation therapy.  All other systems reviewed and are negative.   PHYSICAL EXAM:   VS:  BP 124/72 (BP Location: Left Arm)   Pulse (!) 59   Ht 5\' 4"  (1.626 m)   Wt 169 lb 6.4 oz (76.8 kg)   BMI 29.08 kg/m    GEN: Well nourished, well developed, in no acute distress  HEENT: normal  Neck: no JVD, carotid bruits, or masses Cardiac: iiRR; no murmurs, rubs, or gallops,no edema  Respiratory:  clear to auscultation bilaterally, normal work of breathing GI: soft, nontender, nondistended, + BS MS: no deformity or atrophy  Skin: warm and dry, no rash Neuro:  Alert and Oriented x 3, Strength and sensation are intact Psych: euthymic mood, full affect  Wt Readings from Last 3 Encounters:  06/12/17 169 lb 6.4 oz (76.8 kg)  05/28/17 170 lb (77.1 kg)  04/25/17 170 lb (77.1 kg)      Studies/Labs Reviewed:   EKG:  EKG  Atrial fibrillation with controlled ventricular responses 59 bpm.  Recent Labs: 08/01/2016: Brain Natriuretic Peptide 185.4 09/13/2016: ALT 9; BUN 26; Creatinine, Ser 1.16; Hemoglobin 12.1; Platelets 259.0; Potassium 4.7; Sodium 138   Lipid Panel    Component Value Date/Time   CHOL 171 11/26/2016 0844   TRIG 99 11/26/2016 0844   TRIG 183 (H) 07/26/2006 0903   HDL 90 11/26/2016 0844   CHOLHDL 1.9 11/26/2016 0844   CHOLHDL 2.2 07/04/2014  0528   VLDL 19 07/04/2014 0528   LDLCALC 61 11/26/2016 0844   LDLDIRECT 89.1 06/29/2013 1604    Additional studies/ records that were reviewed today include:   Echocardiogram 09/16/15: Study Conclusions   - Left ventricle: The cavity size was normal. Wall thickness was   normal. Systolic function was normal. The estimated ejection   fraction was in the range of 55% to 60%. Wall motion was normal;   there were no regional wall  motion abnormalities. - Mitral valve: There was mild regurgitation. - Left atrium: The atrium was mildly dilated. - Right atrium: The atrium was mildly dilated. - Tricuspid valve: There was mild-moderate regurgitation directed   centrally. - Pulmonary arteries: Systolic pressure was mildly increased. PA   peak pressure: 38 mm Hg (S).  Nuclear Stress study 08/25/2016: Study Highlights   Nuclear stress EF: 66%.  This is a low risk study.  The left ventricular ejection fraction is normal (55-65%).  Defect 1: There is a medium defect of moderate severity present in the mid anteroseptal, mid inferoseptal, mid inferolateral, apical anterior, apical inferior, apical lateral and apex location.  This defect likely represents breast attenuation artifact. The defect is fixed and the apex of the LV contracts vigorously .   Coronary calcification noted on CT angiogram of the chest performed in 2014.     ASSESSMENT:    1. PAF (paroxysmal atrial fibrillation) ([place])   2. CAD in native artery   3. Essential hypertension      PLAN:  In order of problems listed above:  1. Presumed still paroxysmal. ECG March 2017 reveals sinus bradycardia with first-degree AV block. Assumption is that the recent recurrence of severe weakness and fatigue is related to recurrent AF. However, she for the most part has been in atrial fibrillation almost prior EKGs with the exception of 2014 tracings and the 2017 tracing. I am recommending that she undergo electrical cardioversion. The  opportunity to maintain sinus rhythm may be relatively limited. Will likely need referral to EP for consideration of ablation or advice concerning long-term antiarrhythmic therapy. 2. Current symptoms are concerning and initially felt to be related to atrial fibrillation. On further review of records, perhaps this is ischemia mediated. She has an abnormal myocardial perfusion study from 2017 and coronary calcification noted on CT. We need to exclude three-vessel coronary disease before exposing her to electrical cardioversion. 3. The blood pressure is well controlled.  We have scheduled the patient for elective electrical cardioversion. After consideration however I will counsel this recommendation and consider coronary angiography. I will see with the patient concerning this. For the time being we will continue anticoagulation therapy. We will hold prior to angiography. If cath does not reveal significant obstructive disease I will then refer to EP and consider anti-arrhythmic therapy prior to cardioversion.    Medication Adjustments/Labs and Tests Ordered: Current medicines are reviewed at length with the patient today.  Concerns regarding medicines are outlined above.  Medication changes, Labs and Tests ordered today are listed in the Patient Instructions below. Patient Instructions  Medication Instructions:  Your physician recommends that you continue on your current medications as directed. Please refer to the Current Medication list given to you today.   Labwork: BMET, CBC and INR today  Testing/Procedures: Your physician has recommended that you have a Cardioversion (DCCV). Electrical Cardioversion uses a jolt of electricity to your heart either through paddles or wired patches attached to your chest. This is a controlled, usually prescheduled, procedure. Defibrillation is done under light anesthesia in the hospital, and you usually go home the day of the procedure. This is done to get your  heart back into a normal rhythm. You are not awake for the procedure. Please see the instruction sheet given to you today.   Follow-Up: Your physician recommends that you schedule a follow-up appointment in: 2-3 weeks after Cardioversion.    Any Other Special Instructions Will Be Listed Below (If Applicable).     If you  need a refill on your cardiac medications before your next appointment, please call your pharmacy.      Signed, Sinclair Grooms, MD  06/12/2017 1:34 PM    [facility] [address], [place], [place]  [address] Phone: [phone]; Fax: [phone]

## 2017-06-12 ENCOUNTER — Encounter: Payer: Self-pay | Admitting: *Deleted

## 2017-06-12 ENCOUNTER — Encounter: Payer: Self-pay | Admitting: Interventional Cardiology

## 2017-06-12 ENCOUNTER — Ambulatory Visit (INDEPENDENT_AMBULATORY_CARE_PROVIDER_SITE_OTHER): Payer: Medicare Other | Admitting: Interventional Cardiology

## 2017-06-12 VITALS — BP 124/72 | HR 59 | Ht 64.0 in | Wt 169.4 lb

## 2017-06-12 DIAGNOSIS — I2510 Atherosclerotic heart disease of native coronary artery without angina pectoris: Secondary | ICD-10-CM

## 2017-06-12 DIAGNOSIS — I480 Paroxysmal atrial fibrillation: Secondary | ICD-10-CM

## 2017-06-12 DIAGNOSIS — I10 Essential (primary) hypertension: Secondary | ICD-10-CM | POA: Diagnosis not present

## 2017-06-12 NOTE — Patient Instructions (Signed)
Medication Instructions:  Your physician recommends that you continue on your current medications as directed. Please refer to the Current Medication list given to you today.   Labwork: BMET, CBC and INR today  Testing/Procedures: Your physician has recommended that you have a Cardioversion (DCCV). Electrical Cardioversion uses a jolt of electricity to your heart either through paddles or wired patches attached to your chest. This is a controlled, usually prescheduled, procedure. Defibrillation is done under light anesthesia in the hospital, and you usually go home the day of the procedure. This is done to get your heart back into a normal rhythm. You are not awake for the procedure. Please see the instruction sheet given to you today.   Follow-Up: Your physician recommends that you schedule a follow-up appointment in: 2-3 weeks after Cardioversion.    Any Other Special Instructions Will Be Listed Below (If Applicable).     If you need a refill on your cardiac medications before your next appointment, please call your pharmacy.

## 2017-06-13 ENCOUNTER — Telehealth: Payer: Self-pay

## 2017-06-13 ENCOUNTER — Telehealth: Payer: Self-pay | Admitting: Internal Medicine

## 2017-06-13 LAB — BASIC METABOLIC PANEL
BUN/Creatinine Ratio: 23 (ref 12–28)
BUN: 29 mg/dL — ABNORMAL HIGH (ref 8–27)
CO2: 22 mmol/L (ref 20–29)
Calcium: 9.3 mg/dL (ref 8.7–10.3)
Chloride: 102 mmol/L (ref 96–106)
Creatinine, Ser: 1.25 mg/dL — ABNORMAL HIGH (ref 0.57–1.00)
GFR calc Af Amer: 47 mL/min/{1.73_m2} — ABNORMAL LOW (ref >59)
GFR calc non Af Amer: 41 mL/min/{1.73_m2} — ABNORMAL LOW (ref >59)
Glucose: 83 mg/dL (ref 65–99)
Potassium: 4.7 mmol/L (ref 3.5–5.2)
Sodium: 137 mmol/L (ref 134–144)

## 2017-06-13 LAB — CBC
Hematocrit: 36.9 % (ref 34.0–46.6)
Hemoglobin: 12.5 g/dL (ref 11.1–15.9)
MCH: 32 pg (ref 26.6–33.0)
MCHC: 33.9 g/dL (ref 31.5–35.7)
MCV: 94 fL (ref 79–97)
Platelets: 252 10*3/uL (ref 150–379)
RBC: 3.91 x10E6/uL (ref 3.77–5.28)
RDW: 15.5 % — ABNORMAL HIGH (ref 12.3–15.4)
WBC: 7.4 10*3/uL (ref 3.4–10.8)

## 2017-06-13 LAB — PROTIME-INR
INR: 1 (ref 0.8–1.2)
Prothrombin Time: 10.2 s (ref 9.1–12.0)

## 2017-06-13 MED ORDER — AMIODARONE HCL 200 MG PO TABS: 200.0000 mg | ORAL_TABLET | Freq: Every day | ORAL | 11 refills | Status: DC

## 2017-06-13 NOTE — Telephone Encounter (Signed)
Called and spoke to patient and made her aware of Dr. Thompson Caul instructions to cancel DCCV, start amiodarone 200 mg QD, and schedule LHC after her [place] trip. Cancelled DCCV. Patient states that she will be back from [place] on 06/25/17 and wants to schedule the cath right after she gets back. Scheduled LHC on 06/26/17 at 11:30 AM. Reviewed cath instructions with the patient and made patient aware that she will need to arrive at 9:00 AM. Made patient aware that she will need to hold her Eliquis 48 hours prior to procedure. Labs were drawn yesterday. Message sent to pre-cert. Rx for amiodarone sent to patient's preferred pharmacy. Patient states that when spoke to Dr. Tamala Julian yesterday that he mentioned changing the dose of her metoprolol. Made patient aware that a message will be sent to Dr. Tamala Julian for clarification. Patient verbalized understanding and thanked me for the call.

## 2017-06-13 NOTE — Telephone Encounter (Signed)
-----   Message from Belva Crome, MD sent at 06/12/2017  7:36 PM EDT ----- Regarding: Atrial fibrillation 1. Cancel the previously scheduled electrical cardioversion. This will not be rescheduled until after coronary angiography. 2. Start amiodarone 200 mg daily. Continue other medications as listed. I will increase the dose further after coronary angiography if indicated. 3. Schedule left heart catheterization and coronary arteriography after she returns from her [place] trip. She will have to hold anticoagulation for 48 hours prior to the procedure.

## 2017-06-13 NOTE — Telephone Encounter (Signed)
Per Dr. Tamala Julian, patient should continue to to take her metoprolol as prescribed. Patient made aware and she verbalized understanding.

## 2017-06-17 MED ORDER — DULOXETINE HCL 60 MG PO CPEP: ORAL_CAPSULE | ORAL | 0 refills | Status: DC

## 2017-06-17 NOTE — Telephone Encounter (Signed)
Per office policy sent 30 day to local pharmacy until appt.../lmb  

## 2017-06-17 NOTE — Addendum Note (Signed)
Addended by: Earnstine Regal on: 06/17/2017 10:45 AM   Modules accepted: Orders

## 2017-06-17 NOTE — Telephone Encounter (Signed)
Pt called regarding this  Appt made for CPE on 10/23 Please advise  [place]

## 2017-06-18 ENCOUNTER — Ambulatory Visit (HOSPITAL_COMMUNITY): Admit: 2017-06-18 | Payer: Medicare Other | Admitting: Internal Medicine

## 2017-06-18 ENCOUNTER — Encounter (HOSPITAL_COMMUNITY): Payer: Self-pay

## 2017-06-18 SURGERY — CARDIOVERSION
Anesthesia: Monitor Anesthesia Care

## 2017-06-20 ENCOUNTER — Telehealth: Payer: Self-pay | Admitting: Interventional Cardiology

## 2017-06-20 NOTE — Telephone Encounter (Signed)
Spoke with pt and informed her to stop Amio.  Pt verbalized understanding and was in agreement with this plan.

## 2017-06-20 NOTE — Telephone Encounter (Signed)
New Message  Pt c/o medication issue:  1. Name of Medication: Amiodarone  2. How are you currently taking this medication (dosage and times per day)? 200mg    3. Are you having a reaction (difficulty breathing--STAT)? No  4. What is your medication issue? Per pt would like to ask some questions about this new medication she was prescribed. Please call back to discuss

## 2017-06-20 NOTE — Telephone Encounter (Signed)
Follow Up:  She said she was talking to you and you were disconnected.

## 2017-06-20 NOTE — Telephone Encounter (Signed)
-   Discontinue amiodarone

## 2017-06-20 NOTE — Telephone Encounter (Signed)
Pt recently started on Amiodarone.  Since starting pt has had some side effects.  Pt states she bruising more easily than usual, even worse than she does just on the Eliquis.  States she has been very fatigued, constipated, experiences heartburn at night, has HA in PM and has some sores on her legs that aren't healing.  Denies dizziness, SOB or palps since starting.  Advised I would send message to Dr. Tamala Julian for review and advisement.

## 2017-06-25 ENCOUNTER — Telehealth: Payer: Self-pay

## 2017-06-25 NOTE — Telephone Encounter (Signed)
Patient contacted pre-catheterization at [facility] scheduled for:  06/26/2017 @ 1130 Verified arrival time and place:  [facility] @ 0900 Confirmed AM meds to be taken pre-cath with sip of water: Take ASA Hold Eliquis x 48 hours Confirmed patient has responsible person to drive home post procedure and observe patient for 24 hours:  yes Addl concerns:  none

## 2017-06-26 ENCOUNTER — Ambulatory Visit (HOSPITAL_COMMUNITY)
Admission: RE | Admit: 2017-06-26 | Discharge: 2017-06-26 | Disposition: A | Discharge status: Home / Self Care | Payer: Medicare Other | Source: Ambulatory Visit | Attending: Interventional Cardiology | Admitting: Interventional Cardiology

## 2017-06-26 ENCOUNTER — Encounter (HOSPITAL_COMMUNITY)
Admission: RE | Disposition: A | Discharge status: Home / Self Care | Payer: Self-pay | Source: Ambulatory Visit | Attending: Interventional Cardiology

## 2017-06-26 ENCOUNTER — Encounter: Payer: Self-pay | Admitting: Nurse Practitioner

## 2017-06-26 DIAGNOSIS — Z87891 Personal history of nicotine dependence: Secondary | ICD-10-CM | POA: Insufficient documentation

## 2017-06-26 DIAGNOSIS — I491 Atrial premature depolarization: Secondary | ICD-10-CM | POA: Diagnosis not present

## 2017-06-26 DIAGNOSIS — I25119 Atherosclerotic heart disease of native coronary artery with unspecified angina pectoris: Secondary | ICD-10-CM | POA: Diagnosis present

## 2017-06-26 DIAGNOSIS — I471 Supraventricular tachycardia: Secondary | ICD-10-CM | POA: Insufficient documentation

## 2017-06-26 DIAGNOSIS — I4821 Permanent atrial fibrillation: Secondary | ICD-10-CM | POA: Diagnosis present

## 2017-06-26 DIAGNOSIS — Z882 Allergy status to sulfonamides status: Secondary | ICD-10-CM | POA: Diagnosis not present

## 2017-06-26 DIAGNOSIS — I2584 Coronary atherosclerosis due to calcified coronary lesion: Secondary | ICD-10-CM | POA: Diagnosis not present

## 2017-06-26 DIAGNOSIS — M8580 Other specified disorders of bone density and structure, unspecified site: Secondary | ICD-10-CM | POA: Insufficient documentation

## 2017-06-26 DIAGNOSIS — K589 Irritable bowel syndrome without diarrhea: Secondary | ICD-10-CM | POA: Diagnosis not present

## 2017-06-26 DIAGNOSIS — I2510 Atherosclerotic heart disease of native coronary artery without angina pectoris: Secondary | ICD-10-CM | POA: Diagnosis present

## 2017-06-26 DIAGNOSIS — Z7901 Long term (current) use of anticoagulants: Secondary | ICD-10-CM | POA: Diagnosis not present

## 2017-06-26 DIAGNOSIS — I25118 Atherosclerotic heart disease of native coronary artery with other forms of angina pectoris: Secondary | ICD-10-CM | POA: Insufficient documentation

## 2017-06-26 DIAGNOSIS — I480 Paroxysmal atrial fibrillation: Secondary | ICD-10-CM | POA: Diagnosis not present

## 2017-06-26 DIAGNOSIS — K219 Gastro-esophageal reflux disease without esophagitis: Secondary | ICD-10-CM | POA: Insufficient documentation

## 2017-06-26 DIAGNOSIS — F419 Anxiety disorder, unspecified: Secondary | ICD-10-CM | POA: Diagnosis not present

## 2017-06-26 DIAGNOSIS — R55 Syncope and collapse: Secondary | ICD-10-CM | POA: Diagnosis present

## 2017-06-26 DIAGNOSIS — I10 Essential (primary) hypertension: Secondary | ICD-10-CM | POA: Insufficient documentation

## 2017-06-26 DIAGNOSIS — I440 Atrioventricular block, first degree: Secondary | ICD-10-CM | POA: Insufficient documentation

## 2017-06-26 HISTORY — PX: LEFT HEART CATH AND CORONARY ANGIOGRAPHY: CATH118249

## 2017-06-26 HISTORY — PX: CARDIAC CATHETERIZATION: SHX172

## 2017-06-26 SURGERY — LEFT HEART CATH AND CORONARY ANGIOGRAPHY
Anesthesia: LOCAL

## 2017-06-26 MED ORDER — VERAPAMIL HCL 2.5 MG/ML IV SOLN
INTRAVENOUS | Status: DC | PRN
  Administered 2017-06-26: 10 mL via INTRA_ARTERIAL

## 2017-06-26 MED ORDER — LIDOCAINE HCL 2 % IJ SOLN
INTRAMUSCULAR | Status: AC
  Filled 2017-06-26: qty 10

## 2017-06-26 MED ORDER — SODIUM CHLORIDE 0.9 % IV SOLN: 250.0000 mL | INTRAVENOUS | Status: DC | PRN

## 2017-06-26 MED ORDER — CLOPIDOGREL BISULFATE 75 MG PO TABS
ORAL_TABLET | ORAL | Status: AC
  Administered 2017-06-26: 150 mg via ORAL
  Filled 2017-06-26: qty 2

## 2017-06-26 MED ORDER — CLOPIDOGREL BISULFATE 75 MG PO TABS: 75.0000 mg | ORAL_TABLET | Freq: Every day | ORAL | 11 refills | Status: DC

## 2017-06-26 MED ORDER — FENTANYL CITRATE (PF) 100 MCG/2ML IJ SOLN
INTRAMUSCULAR | Status: DC | PRN
  Administered 2017-06-26: 50 ug via INTRAVENOUS

## 2017-06-26 MED ORDER — SODIUM CHLORIDE 0.9% FLUSH: 3.0000 mL | Freq: Two times a day (BID) | INTRAVENOUS | Status: DC

## 2017-06-26 MED ORDER — HEPARIN (PORCINE) IN NACL 2-0.9 UNIT/ML-% IJ SOLN
INTRAMUSCULAR | Status: AC | PRN
  Administered 2017-06-26: 1000 mL

## 2017-06-26 MED ORDER — ASPIRIN 81 MG PO CHEW: 81.0000 mg | CHEWABLE_TABLET | ORAL | Status: DC

## 2017-06-26 MED ORDER — LIDOCAINE HCL 2 % IJ SOLN
INTRAMUSCULAR | Status: DC | PRN
  Administered 2017-06-26: 10 mL

## 2017-06-26 MED ORDER — MIDAZOLAM HCL 2 MG/2ML IJ SOLN
INTRAMUSCULAR | Status: DC | PRN
Start: 2017-06-26 — End: 2017-06-26
  Administered 2017-06-26: 1 mg via INTRAVENOUS

## 2017-06-26 MED ORDER — ACETAMINOPHEN 325 MG PO TABS: 650.0000 mg | ORAL_TABLET | ORAL | Status: DC | PRN

## 2017-06-26 MED ORDER — FENTANYL CITRATE (PF) 100 MCG/2ML IJ SOLN
INTRAMUSCULAR | Status: AC
  Filled 2017-06-26: qty 2

## 2017-06-26 MED ORDER — SODIUM CHLORIDE 0.9% FLUSH: 3.0000 mL | INTRAVENOUS | Status: DC | PRN

## 2017-06-26 MED ORDER — CLOPIDOGREL BISULFATE 75 MG PO TABS
150.0000 mg | ORAL_TABLET | Freq: Once | ORAL | Status: AC
  Administered 2017-06-26: 150 mg via ORAL

## 2017-06-26 MED ORDER — HEPARIN (PORCINE) IN NACL 2-0.9 UNIT/ML-% IJ SOLN
INTRAMUSCULAR | Status: AC
  Filled 2017-06-26: qty 1000

## 2017-06-26 MED ORDER — SODIUM CHLORIDE 0.9 % WEIGHT BASED INFUSION
3.0000 mL/kg/h | INTRAVENOUS | Status: AC
  Administered 2017-06-26: 3 mL/kg/h via INTRAVENOUS

## 2017-06-26 MED ORDER — IOPAMIDOL (ISOVUE-370) INJECTION 76%
INTRAVENOUS | Status: AC
  Filled 2017-06-26: qty 100

## 2017-06-26 MED ORDER — ONDANSETRON HCL 4 MG/2ML IJ SOLN: 4.0000 mg | Freq: Four times a day (QID) | INTRAMUSCULAR | Status: DC | PRN

## 2017-06-26 MED ORDER — HEPARIN SODIUM (PORCINE) 1000 UNIT/ML IJ SOLN
INTRAMUSCULAR | Status: AC
  Filled 2017-06-26: qty 1

## 2017-06-26 MED ORDER — HEPARIN SODIUM (PORCINE) 1000 UNIT/ML IJ SOLN
INTRAMUSCULAR | Status: DC | PRN
  Administered 2017-06-26: 4000 [IU] via INTRAVENOUS

## 2017-06-26 MED ORDER — IOPAMIDOL (ISOVUE-370) INJECTION 76%
INTRAVENOUS | Status: DC | PRN
Start: 2017-06-26 — End: 2017-06-26
  Administered 2017-06-26: 80 mL via INTRA_ARTERIAL

## 2017-06-26 MED ORDER — NITROGLYCERIN 1 MG/10 ML FOR IR/CATH LAB
INTRA_ARTERIAL | Status: AC
  Filled 2017-06-26: qty 10

## 2017-06-26 MED ORDER — MIDAZOLAM HCL 2 MG/2ML IJ SOLN
INTRAMUSCULAR | Status: AC
  Filled 2017-06-26: qty 2

## 2017-06-26 MED ORDER — SODIUM CHLORIDE 0.9 % WEIGHT BASED INFUSION
1.0000 mL/kg/h | INTRAVENOUS | Status: DC
Start: 2017-06-26 — End: 2017-06-26

## 2017-06-26 MED ORDER — VERAPAMIL HCL 2.5 MG/ML IV SOLN
INTRAVENOUS | Status: AC
  Filled 2017-06-26: qty 2

## 2017-06-26 MED ORDER — SODIUM CHLORIDE 0.9 % IV SOLN: INTRAVENOUS | Status: DC

## 2017-06-26 SURGICAL SUPPLY — 15 items
CATH INFINITI 5 FR JL3.5 (CATHETERS) ×2 IMPLANT
CATH INFINITI 5FR JL4 (CATHETERS) ×2 IMPLANT
CATH INFINITI 5FR JL5 (CATHETERS) ×2 IMPLANT
CATH INFINITI JR4 5F (CATHETERS) ×2 IMPLANT
COVER PRB 48X5XTLSCP FOLD TPE (BAG) ×1 IMPLANT
COVER PROBE 5X48 (BAG) ×1
DEVICE RAD COMP TR BAND LRG (VASCULAR PRODUCTS) ×2 IMPLANT
GLIDESHEATH SLEND A-KIT 6F 22G (SHEATH) ×2 IMPLANT
GUIDEWIRE INQWIRE 1.5J.035X260 (WIRE) ×1 IMPLANT
INQWIRE 1.5J .035X260CM (WIRE) ×2
KIT HEART LEFT (KITS) ×2 IMPLANT
PACK CARDIAC CATHETERIZATION (CUSTOM PROCEDURE TRAY) ×2 IMPLANT
TRANSDUCER W/STOPCOCK (MISCELLANEOUS) ×2 IMPLANT
TUBING CIL FLEX 10 FLL-RA (TUBING) ×2 IMPLANT
WIRE HI TORQ VERSACORE-J 145CM (WIRE) ×2 IMPLANT

## 2017-06-26 NOTE — Interval H&P Note (Signed)
Cath Lab Visit (complete for each Cath Lab visit)  Clinical Evaluation Leading to the Procedure:   ACS: No.  Non-ACS:    Anginal Classification: CCS III  Anti-ischemic medical therapy: Minimal Therapy (1 class of medications)  Non-Invasive Test Results: Intermediate-risk stress test findings: cardiac mortality 1-3%/year  Prior CABG: No previous CABG      History and Physical Interval Note:  06/26/2017 10:41 AM  [place]  has presented today for surgery, with the diagnosis of cad  The various methods of treatment have been discussed with the patient and family. After consideration of risks, benefits and other options for treatment, the patient has consented to  Procedure(s): LEFT HEART CATH AND CORONARY ANGIOGRAPHY (N/A) as a surgical intervention .  The patient's history has been reviewed, patient examined, no change in status, stable for surgery.  I have reviewed the patient's chart and labs.  Questions were answered to the patient's satisfaction.     Belva Crome III

## 2017-06-26 NOTE — Discharge Instructions (Signed)

## 2017-06-26 NOTE — H&P (View-Only) (Signed)
Cardiology Office Note    Date:  06/12/2017   ID:  Hailey [name], DOB 1935/01/02, MRN [id number]  PCP:  Hailey Anger, MD  Cardiologist: Sinclair Grooms, MD   Chief Complaint  Patient presents with  . Atrial Fibrillation    History of Present Illness:  Hailey [name] is a 82 y.o. female who presents for PSVT, paroxysmal atrial fibrillation, chronic anticoagulation, and hypertension.  2-3 week history of extreme fatigue, lightheadedness, and weakness.She feels her arms been beating faster than usual. She denies chest pain. There is some exertional dyspnea. She has been under extreme emotional stress since the summer because of the death of her son.   Past Medical History:  Diagnosis Date  . Allergic rhinitis   . Anxiety   . Atrial fibrillation (Hailey [name])   . Diverticulosis of colon   . GERD (gastroesophageal reflux disease)   . Hip bursitis 2010   Dr Para March, Post op seroma  . History of colon polyps   . HTN (hypertension)   . IBS (irritable bowel syndrome)    constipation predominant - Dr Earlean Shawl  . Lichen sclerosus   . Osteopenia 11/2016   T score -2.0 FRAX 15%/4.3%  . PAC (premature atrial contraction)    Symptomatiic  . SVT (supraventricular tachycardia) ([place])    brief history    Past Surgical History:  Procedure Laterality Date  . APPENDECTOMY    . BLADDER SURGERY    . BREAST REDUCTION SURGERY    . OOPHORECTOMY     BSO  . TOE SURGERY    . Troch Bursa Resection  2010  . VAGINAL HYSTERECTOMY     BSO    Current Medications: Outpatient Medications Prior to Visit  Medication Sig Dispense Refill  . Acetaminophen (TYLENOL PO) Take 2 tablets by mouth daily as needed (pain).    . cetirizine (ZYRTEC) 10 MG tablet Take 10 mg by mouth daily as needed for allergies or rhinitis.     . clobetasol cream (TEMOVATE) 0.05 % APPLY NIGHTLY AS DIRECTED. 30 g 2  . diclofenac sodium (VOLTAREN) 1 % GEL APPLY 2 GRAMS TOPICALLY 4 TIMES DAILY. 100 g 0  .  diphenoxylate-atropine (LOMOTIL) 2.5-0.025 MG tablet Take 1 tablet by mouth 4 (four) times daily as needed for diarrhea or loose stools. 60 tablet 1  . DULoxetine (CYMBALTA) 60 MG capsule TAKE (1) CAPSULE DAILY. 90 capsule 1  . ELIQUIS 5 MG TABS tablet TAKE 1 TABLET TWICE DAILY. 180 tablet 2  . esomeprazole (NEXIUM) 40 MG capsule Take 40 mg by mouth daily before breakfast.      . linaclotide (LINZESS) 290 MCG CAPS capsule Take 1 capsule (290 mcg total) by mouth daily as needed. 30 capsule 5  . LORazepam (ATIVAN) 1 MG tablet TAKE (1) TABLET TWICE A DAY AS NEEDED. 180 tablet 0  . metoprolol succinate (TOPROL-XL) 50 MG 24 hr tablet TAKE 1 TABLET TWICE DAILY. 180 tablet 3  . pravastatin (PRAVACHOL) 20 MG tablet Take 1 tablet by mouth on Monday, Wednesday and Friday evenings. 39 tablet 2  . promethazine (PHENERGAN) 25 MG tablet Take 1 tablet (25 mg total) by mouth every 8 (eight) hours as needed for nausea or vomiting. 30 tablet 0  . sodium chloride (OCEAN) 0.65 % SOLN nasal spray Place 1 spray into both nostrils 2 (two) times daily as needed for congestion. 15 mL 0  . ciprofloxacin (CIPRO) 250 MG tablet Take 1 tablet (250 mg total) by mouth 2 (two) times daily. (  Patient not taking: Reported on 04/04/2017) 6 tablet 0  . terconazole (TERAZOL 7) 0.4 % vaginal cream Place 1 applicator vaginally at bedtime. For 7 nights (Patient not taking: Reported on 04/04/2017) 45 g 0  . traMADol (ULTRAM) 50 MG tablet Take 1 tablet (50 mg total) by mouth 2 (two) times daily. (Patient not taking: Reported on 06/12/2017) 60 tablet 2   No facility-administered medications prior to visit.      Allergies:   Macrobid [nitrofurantoin monohyd macro]; Meloxicam; Digoxin and related; Sulfamethoxazole-trimethoprim; and Tramadol   Social History   Social History  . Marital status: Married    Spouse name: N/A  . Number of children: 2  . Years of education: N/A   Occupational History  . Travel Agent Retired   Social History  Main Topics  . Smoking status: Former Smoker    Quit date: 09/11/1975  . Smokeless tobacco: Never Used  . Alcohol use 4.2 oz/week    7 Standard drinks or equivalent per week  . Drug use: No  . Sexual activity: Yes    Birth control/ protection: Surgical     Comment: 1st intercourse 82 yo--Fewer than 5 partners   Other Topics Concern  . None   Social History Narrative   Regular Exercise -  YES           Family History:  The patient's family history includes Cancer in her brother, father, and son; Colon cancer in her mother; Diabetes in her father.   ROS:   Please see the history of present illness.    Easy bruising on anticoagulation therapy.  All other systems reviewed and are negative.   PHYSICAL EXAM:   VS:  BP 124/72 (BP Location: Left Arm)   Pulse (!) 59   Ht 5\' 4"  (1.626 m)   Wt 169 lb 6.4 oz (76.8 kg)   BMI 29.08 kg/m    GEN: Well nourished, well developed, in no acute distress  HEENT: normal  Neck: no JVD, carotid bruits, or masses Cardiac: iiRR; no murmurs, rubs, or gallops,no edema  Respiratory:  clear to auscultation bilaterally, normal work of breathing GI: soft, nontender, nondistended, + BS MS: no deformity or atrophy  Skin: warm and dry, no rash Neuro:  Alert and Oriented x 3, Strength and sensation are intact Psych: euthymic mood, full affect  Wt Readings from Last 3 Encounters:  06/12/17 169 lb 6.4 oz (76.8 kg)  05/28/17 170 lb (77.1 kg)  04/25/17 170 lb (77.1 kg)      Studies/Labs Reviewed:   EKG:  EKG  Atrial fibrillation with controlled ventricular responses 59 bpm.  Recent Labs: 08/01/2016: Brain Natriuretic Peptide 185.4 09/13/2016: ALT 9; BUN 26; Creatinine, Ser 1.16; Hemoglobin 12.1; Platelets 259.0; Potassium 4.7; Sodium 138   Lipid Panel    Component Value Date/Time   CHOL 171 11/26/2016 0844   TRIG 99 11/26/2016 0844   TRIG 183 (H) 07/26/2006 0903   HDL 90 11/26/2016 0844   CHOLHDL 1.9 11/26/2016 0844   CHOLHDL 2.2 07/04/2014  0528   VLDL 19 07/04/2014 0528   LDLCALC 61 11/26/2016 0844   LDLDIRECT 89.1 06/29/2013 1604    Additional studies/ records that were reviewed today include:   Echocardiogram 09/16/15: Study Conclusions   - Left ventricle: The cavity size was normal. Wall thickness was   normal. Systolic function was normal. The estimated ejection   fraction was in the range of 55% to 60%. Wall motion was normal;   there were no regional wall  motion abnormalities. - Mitral valve: There was mild regurgitation. - Left atrium: The atrium was mildly dilated. - Right atrium: The atrium was mildly dilated. - Tricuspid valve: There was mild-moderate regurgitation directed   centrally. - Pulmonary arteries: Systolic pressure was mildly increased. PA   peak pressure: 38 mm Hg (S).  Nuclear Stress study 08/25/2016: Study Highlights   Nuclear stress EF: 66%.  This is a low risk study.  The left ventricular ejection fraction is normal (55-65%).  Defect 1: There is a medium defect of moderate severity present in the mid anteroseptal, mid inferoseptal, mid inferolateral, apical anterior, apical inferior, apical lateral and apex location.  This defect likely represents breast attenuation artifact. The defect is fixed and the apex of the LV contracts vigorously .   Coronary calcification noted on CT angiogram of the chest performed in 2014.     ASSESSMENT:    1. PAF (paroxysmal atrial fibrillation) ([place])   2. CAD in native artery   3. Essential hypertension      PLAN:  In order of problems listed above:  1. Presumed still paroxysmal. ECG March 2017 reveals sinus bradycardia with first-degree AV block. Assumption is that the recent recurrence of severe weakness and fatigue is related to recurrent AF. However, she for the most part has been in atrial fibrillation almost prior EKGs with the exception of 2014 tracings and the 2017 tracing. I am recommending that she undergo electrical cardioversion. The  opportunity to maintain sinus rhythm may be relatively limited. Will likely need referral to EP for consideration of ablation or advice concerning long-term antiarrhythmic therapy. 2. Current symptoms are concerning and initially felt to be related to atrial fibrillation. On further review of records, perhaps this is ischemia mediated. She has an abnormal myocardial perfusion study from 2017 and coronary calcification noted on CT. We need to exclude three-vessel coronary disease before exposing her to electrical cardioversion. 3. The blood pressure is well controlled.  We have scheduled the patient for elective electrical cardioversion. After consideration however I will counsel this recommendation and consider coronary angiography. I will see with the patient concerning this. For the time being we will continue anticoagulation therapy. We will hold prior to angiography. If cath does not reveal significant obstructive disease I will then refer to EP and consider anti-arrhythmic therapy prior to cardioversion.    Medication Adjustments/Labs and Tests Ordered: Current medicines are reviewed at length with the patient today.  Concerns regarding medicines are outlined above.  Medication changes, Labs and Tests ordered today are listed in the Patient Instructions below. Patient Instructions  Medication Instructions:  Your physician recommends that you continue on your current medications as directed. Please refer to the Current Medication list given to you today.   Labwork: BMET, CBC and INR today  Testing/Procedures: Your physician has recommended that you have a Cardioversion (DCCV). Electrical Cardioversion uses a jolt of electricity to your heart either through paddles or wired patches attached to your chest. This is a controlled, usually prescheduled, procedure. Defibrillation is done under light anesthesia in the hospital, and you usually go home the day of the procedure. This is done to get your  heart back into a normal rhythm. You are not awake for the procedure. Please see the instruction sheet given to you today.   Follow-Up: Your physician recommends that you schedule a follow-up appointment in: 2-3 weeks after Cardioversion.    Any Other Special Instructions Will Be Listed Below (If Applicable).     If you  need a refill on your cardiac medications before your next appointment, please call your pharmacy.      Signed, Sinclair Grooms, MD  06/12/2017 1:34 PM    [facility] [place], [place], [place]  [address] Phone: [phone]; Fax: [phone]

## 2017-06-27 ENCOUNTER — Encounter (HOSPITAL_COMMUNITY): Payer: Self-pay | Admitting: Interventional Cardiology

## 2017-06-27 MED FILL — Nitroglycerin IV Soln 100 MCG/ML in D5W: INTRA_ARTERIAL | Qty: 10 | Status: AC

## 2017-07-01 ENCOUNTER — Encounter (HOSPITAL_COMMUNITY): Payer: Self-pay | Admitting: General Practice

## 2017-07-01 ENCOUNTER — Ambulatory Visit (HOSPITAL_COMMUNITY)
Admission: RE | Admit: 2017-07-01 | Discharge: 2017-07-02 | Disposition: A | Discharge status: Home / Self Care | Payer: Medicare Other | Source: Ambulatory Visit | Attending: Interventional Cardiology | Admitting: Interventional Cardiology

## 2017-07-01 ENCOUNTER — Ambulatory Visit (HOSPITAL_COMMUNITY)
Admission: RE | Disposition: A | Discharge status: Home / Self Care | Payer: Self-pay | Source: Ambulatory Visit | Attending: Interventional Cardiology

## 2017-07-01 DIAGNOSIS — Z7901 Long term (current) use of anticoagulants: Secondary | ICD-10-CM

## 2017-07-01 DIAGNOSIS — Z882 Allergy status to sulfonamides status: Secondary | ICD-10-CM | POA: Diagnosis not present

## 2017-07-01 DIAGNOSIS — K589 Irritable bowel syndrome without diarrhea: Secondary | ICD-10-CM | POA: Diagnosis not present

## 2017-07-01 DIAGNOSIS — I480 Paroxysmal atrial fibrillation: Secondary | ICD-10-CM | POA: Insufficient documentation

## 2017-07-01 DIAGNOSIS — I491 Atrial premature depolarization: Secondary | ICD-10-CM | POA: Insufficient documentation

## 2017-07-01 DIAGNOSIS — I25118 Atherosclerotic heart disease of native coronary artery with other forms of angina pectoris: Secondary | ICD-10-CM | POA: Diagnosis not present

## 2017-07-01 DIAGNOSIS — I25119 Atherosclerotic heart disease of native coronary artery with unspecified angina pectoris: Secondary | ICD-10-CM | POA: Diagnosis present

## 2017-07-01 DIAGNOSIS — I10 Essential (primary) hypertension: Secondary | ICD-10-CM | POA: Diagnosis not present

## 2017-07-01 DIAGNOSIS — I4821 Permanent atrial fibrillation: Secondary | ICD-10-CM | POA: Diagnosis present

## 2017-07-01 DIAGNOSIS — K219 Gastro-esophageal reflux disease without esophagitis: Secondary | ICD-10-CM | POA: Insufficient documentation

## 2017-07-01 DIAGNOSIS — M8580 Other specified disorders of bone density and structure, unspecified site: Secondary | ICD-10-CM | POA: Diagnosis not present

## 2017-07-01 DIAGNOSIS — Z87891 Personal history of nicotine dependence: Secondary | ICD-10-CM | POA: Insufficient documentation

## 2017-07-01 DIAGNOSIS — F419 Anxiety disorder, unspecified: Secondary | ICD-10-CM | POA: Insufficient documentation

## 2017-07-01 DIAGNOSIS — Z955 Presence of coronary angioplasty implant and graft: Secondary | ICD-10-CM

## 2017-07-01 DIAGNOSIS — I2584 Coronary atherosclerosis due to calcified coronary lesion: Secondary | ICD-10-CM | POA: Diagnosis not present

## 2017-07-01 HISTORY — DX: Scoliosis, unspecified: M41.9

## 2017-07-01 HISTORY — PX: CORONARY STENT INTERVENTION: CATH118234

## 2017-07-01 HISTORY — PX: TEMPORARY PACEMAKER: CATH118268

## 2017-07-01 HISTORY — DX: Atherosclerotic heart disease of native coronary artery without angina pectoris: I25.10

## 2017-07-01 HISTORY — PX: CORONARY ATHERECTOMY: CATH118238

## 2017-07-01 HISTORY — DX: Unspecified osteoarthritis, unspecified site: M19.90

## 2017-07-01 HISTORY — PX: CORONARY ANGIOPLASTY WITH STENT PLACEMENT: SHX49

## 2017-07-01 LAB — CBC
HCT: 37.9 % (ref 36.0–46.0)
Hemoglobin: 12.7 g/dL (ref 12.0–15.0)
MCH: 31.8 pg (ref 26.0–34.0)
MCHC: 33.5 g/dL (ref 30.0–36.0)
MCV: 95 fL (ref 78.0–100.0)
Platelets: 259 10*3/uL (ref 150–400)
RBC: 3.99 MIL/uL (ref 3.87–5.11)
RDW: 14.3 % (ref 11.5–15.5)
WBC: 5.3 10*3/uL (ref 4.0–10.5)

## 2017-07-01 LAB — BASIC METABOLIC PANEL
Anion gap: 7 (ref 5–15)
BUN: 20 mg/dL (ref 6–20)
CO2: 20 mmol/L — ABNORMAL LOW (ref 22–32)
Calcium: 8.9 mg/dL (ref 8.9–10.3)
Chloride: 106 mmol/L (ref 101–111)
Creatinine, Ser: 1.28 mg/dL — ABNORMAL HIGH (ref 0.44–1.00)
GFR calc Af Amer: 45 mL/min — ABNORMAL LOW (ref >60)
GFR calc non Af Amer: 38 mL/min — ABNORMAL LOW (ref >60)
Glucose, Bld: 98 mg/dL (ref 65–99)
Potassium: 4.1 mmol/L (ref 3.5–5.1)
Sodium: 133 mmol/L — ABNORMAL LOW (ref 135–145)

## 2017-07-01 LAB — POCT ACTIVATED CLOTTING TIME
Activated Clotting Time: 444 seconds
Activated Clotting Time: 445 seconds

## 2017-07-01 SURGERY — CORONARY ATHERECTOMY
Anesthesia: LOCAL

## 2017-07-01 MED ORDER — SODIUM CHLORIDE 0.9% FLUSH: 3.0000 mL | INTRAVENOUS | Status: DC | PRN

## 2017-07-01 MED ORDER — CLOPIDOGREL BISULFATE 75 MG PO TABS: 75.0000 mg | ORAL_TABLET | Freq: Once | ORAL | Status: DC

## 2017-07-01 MED ORDER — SODIUM CHLORIDE 0.9 % IV SOLN
INTRAVENOUS | Status: DC | PRN
  Administered 2017-07-01 (×2): 1.75 mg/kg/h via INTRAVENOUS

## 2017-07-01 MED ORDER — LORAZEPAM 0.5 MG PO TABS
1.0000 mg | ORAL_TABLET | Freq: Two times a day (BID) | ORAL | Status: DC | PRN
  Filled 2017-07-01: qty 2

## 2017-07-01 MED ORDER — VERAPAMIL HCL 2.5 MG/ML IV SOLN
INTRAVENOUS | Status: AC
  Filled 2017-07-01: qty 2

## 2017-07-01 MED ORDER — MIDAZOLAM HCL 2 MG/2ML IJ SOLN
INTRAMUSCULAR | Status: AC
  Filled 2017-07-01: qty 2

## 2017-07-01 MED ORDER — FENTANYL CITRATE (PF) 100 MCG/2ML IJ SOLN
INTRAMUSCULAR | Status: DC | PRN
  Administered 2017-07-01: 25 ug via INTRAVENOUS
  Administered 2017-07-01 (×2): 50 ug via INTRAVENOUS

## 2017-07-01 MED ORDER — SODIUM CHLORIDE 0.9 % WEIGHT BASED INFUSION: 1.0000 mL/kg/h | INTRAVENOUS | Status: DC

## 2017-07-01 MED ORDER — NITROGLYCERIN 1 MG/10 ML FOR IR/CATH LAB
INTRA_ARTERIAL | Status: DC | PRN
  Administered 2017-07-01 (×3): 200 ug via INTRACORONARY

## 2017-07-01 MED ORDER — SODIUM CHLORIDE 0.9% FLUSH: 3.0000 mL | Freq: Two times a day (BID) | INTRAVENOUS | Status: DC

## 2017-07-01 MED ORDER — HEPARIN (PORCINE) IN NACL 2-0.9 UNIT/ML-% IJ SOLN
INTRAMUSCULAR | Status: DC | PRN
  Administered 2017-07-01: 1000 mL

## 2017-07-01 MED ORDER — ONDANSETRON HCL 4 MG/2ML IJ SOLN: 4.0000 mg | Freq: Four times a day (QID) | INTRAMUSCULAR | Status: DC | PRN

## 2017-07-01 MED ORDER — CLOPIDOGREL BISULFATE 75 MG PO TABS
ORAL_TABLET | ORAL | Status: AC
  Filled 2017-07-01: qty 2

## 2017-07-01 MED ORDER — BIVALIRUDIN BOLUS VIA INFUSION - CUPID
INTRAVENOUS | Status: DC | PRN
  Administered 2017-07-01: 57.825 mg via INTRAVENOUS

## 2017-07-01 MED ORDER — LIDOCAINE HCL 2 % IJ SOLN
INTRAMUSCULAR | Status: AC
  Filled 2017-07-01: qty 10

## 2017-07-01 MED ORDER — KETOTIFEN FUMARATE 0.025 % OP SOLN
1.0000 [drp] | Freq: Every day | OPHTHALMIC | Status: DC | PRN
Start: 2017-07-01 — End: 2017-07-02
  Filled 2017-07-01: qty 5

## 2017-07-01 MED ORDER — HEPARIN SODIUM (PORCINE) 5000 UNIT/ML IJ SOLN: 5000.0000 [IU] | Freq: Three times a day (TID) | INTRAMUSCULAR | Status: DC

## 2017-07-01 MED ORDER — IOPAMIDOL (ISOVUE-370) INJECTION 76%
INTRAVENOUS | Status: AC
  Filled 2017-07-01: qty 100

## 2017-07-01 MED ORDER — OXYCODONE HCL 5 MG PO TABS: 5.0000 mg | ORAL_TABLET | ORAL | Status: DC | PRN

## 2017-07-01 MED ORDER — SODIUM CHLORIDE 0.9% FLUSH
3.0000 mL | Freq: Two times a day (BID) | INTRAVENOUS | Status: DC
  Administered 2017-07-01: 14:00:00 3 mL via INTRAVENOUS

## 2017-07-01 MED ORDER — ASPIRIN EC 81 MG PO TBEC
81.0000 mg | DELAYED_RELEASE_TABLET | Freq: Every day | ORAL | Status: DC
  Administered 2017-07-02: 81 mg via ORAL
  Filled 2017-07-01: qty 1

## 2017-07-01 MED ORDER — NITROGLYCERIN 1 MG/10 ML FOR IR/CATH LAB
INTRA_ARTERIAL | Status: AC
  Filled 2017-07-01: qty 10

## 2017-07-01 MED ORDER — APIXABAN 5 MG PO TABS
5.0000 mg | ORAL_TABLET | Freq: Two times a day (BID) | ORAL | Status: DC
  Administered 2017-07-02: 5 mg via ORAL
  Filled 2017-07-01: qty 1

## 2017-07-01 MED ORDER — FENTANYL CITRATE (PF) 100 MCG/2ML IJ SOLN
INTRAMUSCULAR | Status: AC
  Filled 2017-07-01: qty 2

## 2017-07-01 MED ORDER — SODIUM CHLORIDE 0.9 % IV SOLN
INTRAVENOUS | Status: AC
  Administered 2017-07-01: 11:00:00 via INTRAVENOUS

## 2017-07-01 MED ORDER — ASPIRIN 81 MG PO CHEW: 81.0000 mg | CHEWABLE_TABLET | Freq: Every day | ORAL | Status: DC

## 2017-07-01 MED ORDER — CLOPIDOGREL BISULFATE 75 MG PO TABS: 75.0000 mg | ORAL_TABLET | Freq: Every day | ORAL | Status: DC

## 2017-07-01 MED ORDER — CLOPIDOGREL BISULFATE 75 MG PO TABS
ORAL_TABLET | ORAL | Status: DC | PRN
  Administered 2017-07-01: 150 mg via ORAL

## 2017-07-01 MED ORDER — SODIUM CHLORIDE 0.9 % IV SOLN: 250.0000 mL | INTRAVENOUS | Status: DC | PRN

## 2017-07-01 MED ORDER — LIDOCAINE HCL 2 % IJ SOLN
INTRAMUSCULAR | Status: DC | PRN
  Administered 2017-07-01: 10 mL

## 2017-07-01 MED ORDER — DULOXETINE HCL 60 MG PO CPEP
60.0000 mg | ORAL_CAPSULE | Freq: Every day | ORAL | Status: DC
  Administered 2017-07-02: 60 mg via ORAL
  Filled 2017-07-01: qty 1

## 2017-07-01 MED ORDER — MIDAZOLAM HCL 2 MG/2ML IJ SOLN
INTRAMUSCULAR | Status: DC | PRN
  Administered 2017-07-01 (×3): 1 mg via INTRAVENOUS

## 2017-07-01 MED ORDER — FAMOTIDINE IN NACL 20-0.9 MG/50ML-% IV SOLN
INTRAVENOUS | Status: DC | PRN
  Administered 2017-07-01: 20 mg via INTRAVENOUS

## 2017-07-01 MED ORDER — IOPAMIDOL (ISOVUE-370) INJECTION 76%
INTRAVENOUS | Status: AC
  Filled 2017-07-01: qty 125

## 2017-07-01 MED ORDER — CLOPIDOGREL BISULFATE 75 MG PO TABS
75.0000 mg | ORAL_TABLET | Freq: Every day | ORAL | Status: DC
  Administered 2017-07-02: 11:00:00 75 mg via ORAL
  Filled 2017-07-01: qty 1

## 2017-07-01 MED ORDER — METOPROLOL SUCCINATE ER 25 MG PO TB24
50.0000 mg | ORAL_TABLET | Freq: Two times a day (BID) | ORAL | Status: DC
  Administered 2017-07-02: 11:00:00 50 mg via ORAL
  Filled 2017-07-01 (×2): qty 2

## 2017-07-01 MED ORDER — DIPHENOXYLATE-ATROPINE 2.5-0.025 MG PO TABS: 1.0000 | ORAL_TABLET | Freq: Four times a day (QID) | ORAL | Status: DC | PRN

## 2017-07-01 MED ORDER — ACETAMINOPHEN 325 MG PO TABS
650.0000 mg | ORAL_TABLET | ORAL | Status: DC | PRN
  Administered 2017-07-02: 650 mg via ORAL
  Filled 2017-07-01 (×2): qty 2

## 2017-07-01 MED ORDER — HEPARIN (PORCINE) IN NACL 2-0.9 UNIT/ML-% IJ SOLN
INTRAMUSCULAR | Status: AC
  Filled 2017-07-01: qty 1000

## 2017-07-01 MED ORDER — IOPAMIDOL (ISOVUE-370) INJECTION 76%
INTRAVENOUS | Status: DC | PRN
  Administered 2017-07-01: 160 mL via INTRA_ARTERIAL

## 2017-07-01 MED ORDER — ATORVASTATIN CALCIUM 80 MG PO TABS
80.0000 mg | ORAL_TABLET | Freq: Every day | ORAL | Status: DC
  Administered 2017-07-01: 80 mg via ORAL
  Filled 2017-07-01: qty 1

## 2017-07-01 MED ORDER — SODIUM CHLORIDE 0.9 % WEIGHT BASED INFUSION
3.0000 mL/kg/h | INTRAVENOUS | Status: DC
  Administered 2017-07-01: 3 mL/kg/h via INTRAVENOUS

## 2017-07-01 MED ORDER — BIVALIRUDIN TRIFLUOROACETATE 250 MG IV SOLR
INTRAVENOUS | Status: AC
  Filled 2017-07-01: qty 250

## 2017-07-01 MED ORDER — PANTOPRAZOLE SODIUM 40 MG PO TBEC
40.0000 mg | DELAYED_RELEASE_TABLET | Freq: Every day | ORAL | Status: DC
  Administered 2017-07-02: 40 mg via ORAL
  Filled 2017-07-01: qty 1

## 2017-07-01 MED ORDER — SALINE SPRAY 0.65 % NA SOLN
1.0000 | Freq: Two times a day (BID) | NASAL | Status: DC | PRN
  Filled 2017-07-01: qty 44

## 2017-07-01 MED ORDER — HYDRALAZINE HCL 20 MG/ML IJ SOLN: 5.0000 mg | INTRAMUSCULAR | Status: AC | PRN

## 2017-07-01 MED ORDER — LABETALOL HCL 5 MG/ML IV SOLN: 10.0000 mg | INTRAVENOUS | Status: AC | PRN

## 2017-07-01 MED ORDER — FAMOTIDINE IN NACL 20-0.9 MG/50ML-% IV SOLN
INTRAVENOUS | Status: AC
  Filled 2017-07-01: qty 50

## 2017-07-01 MED ORDER — ASPIRIN 81 MG PO CHEW: 81.0000 mg | CHEWABLE_TABLET | ORAL | Status: DC

## 2017-07-01 MED ORDER — ANGIOPLASTY BOOK
Freq: Once | Status: AC
  Administered 2017-07-01: 21:00:00
  Filled 2017-07-01: qty 1

## 2017-07-01 SURGICAL SUPPLY — 31 items
BALLN EUPHORA RX 3.5X15 (BALLOONS) ×2
BALLN EUPHORA RX 3.75X20 (BALLOONS) ×2
BALLN SAPPHIRE ~~LOC~~ 3.75X18 (BALLOONS) ×2 IMPLANT
BALLN SPRINTER MX OTW 1.5X12 (BALLOONS) ×2
BALLN ~~LOC~~ EUPHORA RX 4.5X15 (BALLOONS) ×2
BALLOON EUPHORA RX 3.5X15 (BALLOONS) ×1 IMPLANT
BALLOON EUPHORA RX 3.75X20 (BALLOONS) ×1 IMPLANT
BALLOON SPRINTER MX OTW 1.5X12 (BALLOONS) ×1 IMPLANT
BALLOON ~~LOC~~ EUPHORA RX 4.5X15 (BALLOONS) ×1 IMPLANT
CATH GUIDEZILLA II 6F (CATHETERS) ×1 IMPLANT
CATH S G BIP PACING (SET/KITS/TRAYS/PACK) ×2 IMPLANT
CATH VISTA GUIDE 6FR JR4 (CATHETERS) ×2 IMPLANT
CATH VISTA GUIDE 6FR XBRCA (CATHETERS) ×2 IMPLANT
CATHETER GUIDEZILLA II 6F (CATHETERS) ×2
COVER PRB 48X5XTLSCP FOLD TPE (BAG) ×1 IMPLANT
COVER PROBE 5X48 (BAG) ×1
CROWN DIAMONDBACK CLASSIC 1.25 (BURR) ×2 IMPLANT
KIT ENCORE 26 ADVANTAGE (KITS) ×2 IMPLANT
KIT HEART LEFT (KITS) ×2 IMPLANT
LUBRICANT VIPERSLIDE CORONARY (MISCELLANEOUS) ×2 IMPLANT
PACK CARDIAC CATHETERIZATION (CUSTOM PROCEDURE TRAY) ×2 IMPLANT
SHEATH PINNACLE 6F 10CM (SHEATH) ×4 IMPLANT
SLEEVE REPOSITIONING LENGTH 30 (MISCELLANEOUS) ×2 IMPLANT
STENT RESOLUTE ONYX 4.0X26 (Permanent Stent) ×2 IMPLANT
TRANSDUCER W/STOPCOCK (MISCELLANEOUS) ×2 IMPLANT
TUBING CIL FLEX 10 FLL-RA (TUBING) ×2 IMPLANT
WIRE ASAHI PROWATER 180CM (WIRE) ×2 IMPLANT
WIRE COUGAR XT STRL 190CM (WIRE) ×2 IMPLANT
WIRE EMERALD 3MM-J .035X150CM (WIRE) ×2 IMPLANT
WIRE GUIDE ASAHI EXTENSION 165 (WIRE) ×2 IMPLANT
WIRE VIPER ADVANCE COR .012TIP (WIRE) ×2 IMPLANT

## 2017-07-01 NOTE — Care Management Note (Signed)
Case Management Note  Patient Details  Name: KARN DERK MRN: [id number] Date of Birth: 1935-05-05  Subjective/Objective:    From home, s/p coronary stent intervention, will be on plavix,  Will also be started on eliquis which patient states she has been taking already.   She is for dc today, no needs.               Action/Plan: NCM awaiting benefit check for eliquis.  Expected Discharge Date:                  Expected Discharge Plan:  Home/Self Care  In-House Referral:     Discharge planning Services  CM Consult  Post Acute Care Choice:    Choice offered to:     DME Arranged:    DME Agency:     HH Arranged:    HH Agency:     Status of Service:  In process, will continue to follow  If discussed at Long Length of Stay Meetings, dates discussed:    Additional Comments:  Zenon Mayo, RN 07/01/2017, 2:53 PM

## 2017-07-01 NOTE — H&P (View-Only) (Signed)
Cardiology Office Note    Date:  06/12/2017   ID:  Hailey [name], DOB Aug 08, 1935, MRN [id number]  PCP:  Hailey Anger, MD  Cardiologist: Sinclair Grooms, MD   Chief Complaint  Patient presents with  . Atrial Fibrillation    History of Present Illness:  Hailey [name] is a 82 y.o. female who presents for PSVT, paroxysmal atrial fibrillation, chronic anticoagulation, and hypertension.  2-3 week history of extreme fatigue, lightheadedness, and weakness.She feels her arms been beating faster than usual. She denies chest pain. There is some exertional dyspnea. She has been under extreme emotional stress since the summer because of the death of her son.   Past Medical History:  Diagnosis Date  . Allergic rhinitis   . Anxiety   . Atrial fibrillation ([place])   . Diverticulosis of colon   . GERD (gastroesophageal reflux disease)   . Hip bursitis 2010   Dr Para March, Post op seroma  . History of colon polyps   . HTN (hypertension)   . IBS (irritable bowel syndrome)    constipation predominant - Dr Earlean Shawl  . Lichen sclerosus   . Osteopenia 11/2016   T score -2.0 FRAX 15%/4.3%  . PAC (premature atrial contraction)    Symptomatiic  . SVT (supraventricular tachycardia) ([place])    brief history    Past Surgical History:  Procedure Laterality Date  . APPENDECTOMY    . BLADDER SURGERY    . BREAST REDUCTION SURGERY    . OOPHORECTOMY     BSO  . TOE SURGERY    . Troch Bursa Resection  2010  . VAGINAL HYSTERECTOMY     BSO    Current Medications: Outpatient Medications Prior to Visit  Medication Sig Dispense Refill  . Acetaminophen (TYLENOL PO) Take 2 tablets by mouth daily as needed (pain).    . cetirizine (ZYRTEC) 10 MG tablet Take 10 mg by mouth daily as needed for allergies or rhinitis.     . clobetasol cream (TEMOVATE) 0.05 % APPLY NIGHTLY AS DIRECTED. 30 g 2  . diclofenac sodium (VOLTAREN) 1 % GEL APPLY 2 GRAMS TOPICALLY 4 TIMES DAILY. 100 g 0  .  diphenoxylate-atropine (LOMOTIL) 2.5-0.025 MG tablet Take 1 tablet by mouth 4 (four) times daily as needed for diarrhea or loose stools. 60 tablet 1  . DULoxetine (CYMBALTA) 60 MG capsule TAKE (1) CAPSULE DAILY. 90 capsule 1  . ELIQUIS 5 MG TABS tablet TAKE 1 TABLET TWICE DAILY. 180 tablet 2  . esomeprazole (NEXIUM) 40 MG capsule Take 40 mg by mouth daily before breakfast.      . linaclotide (LINZESS) 290 MCG CAPS capsule Take 1 capsule (290 mcg total) by mouth daily as needed. 30 capsule 5  . LORazepam (ATIVAN) 1 MG tablet TAKE (1) TABLET TWICE A DAY AS NEEDED. 180 tablet 0  . metoprolol succinate (TOPROL-XL) 50 MG 24 hr tablet TAKE 1 TABLET TWICE DAILY. 180 tablet 3  . pravastatin (PRAVACHOL) 20 MG tablet Take 1 tablet by mouth on Monday, Wednesday and Friday evenings. 39 tablet 2  . promethazine (PHENERGAN) 25 MG tablet Take 1 tablet (25 mg total) by mouth every 8 (eight) hours as needed for nausea or vomiting. 30 tablet 0  . sodium chloride (OCEAN) 0.65 % SOLN nasal spray Place 1 spray into both nostrils 2 (two) times daily as needed for congestion. 15 mL 0  . ciprofloxacin (CIPRO) 250 MG tablet Take 1 tablet (250 mg total) by mouth 2 (two) times daily. (  Patient not taking: Reported on 04/04/2017) 6 tablet 0  . terconazole (TERAZOL 7) 0.4 % vaginal cream Place 1 applicator vaginally at bedtime. For 7 nights (Patient not taking: Reported on 04/04/2017) 45 g 0  . traMADol (ULTRAM) 50 MG tablet Take 1 tablet (50 mg total) by mouth 2 (two) times daily. (Patient not taking: Reported on 06/12/2017) 60 tablet 2   No facility-administered medications prior to visit.      Allergies:   Macrobid [nitrofurantoin monohyd macro]; Meloxicam; Digoxin and related; Sulfamethoxazole-trimethoprim; and Tramadol   Social History   Social History  . Marital status: Married    Spouse name: N/A  . Number of children: 2  . Years of education: N/A   Occupational History  . Travel Agent Retired   Social History  Main Topics  . Smoking status: Former Smoker    Quit date: 09/11/1975  . Smokeless tobacco: Never Used  . Alcohol use 4.2 oz/week    7 Standard drinks or equivalent per week  . Drug use: No  . Sexual activity: Yes    Birth control/ protection: Surgical     Comment: 1st intercourse 82 yo--Fewer than 5 partners   Other Topics Concern  . None   Social History Narrative   Regular Exercise -  YES           Family History:  The patient's family history includes Cancer in her brother, father, and son; Colon cancer in her mother; Diabetes in her father.   ROS:   Please see the history of present illness.    Easy bruising on anticoagulation therapy.  All other systems reviewed and are negative.   PHYSICAL EXAM:   VS:  BP 124/72 (BP Location: Left Arm)   Pulse (!) 59   Ht 5\' 4"  (1.626 m)   Wt 169 lb 6.4 oz (76.8 kg)   BMI 29.08 kg/m    GEN: Well nourished, well developed, in no acute distress  HEENT: normal  Neck: no JVD, carotid bruits, or masses Cardiac: iiRR; no murmurs, rubs, or gallops,no edema  Respiratory:  clear to auscultation bilaterally, normal work of breathing GI: soft, nontender, nondistended, + BS MS: no deformity or atrophy  Skin: warm and dry, no rash Neuro:  Alert and Oriented x 3, Strength and sensation are intact Psych: euthymic mood, full affect  Wt Readings from Last 3 Encounters:  06/12/17 169 lb 6.4 oz (76.8 kg)  05/28/17 170 lb (77.1 kg)  04/25/17 170 lb (77.1 kg)      Studies/Labs Reviewed:   EKG:  EKG  Atrial fibrillation with controlled ventricular responses 59 bpm.  Recent Labs: 08/01/2016: Brain Natriuretic Peptide 185.4 09/13/2016: ALT 9; BUN 26; Creatinine, Ser 1.16; Hemoglobin 12.1; Platelets 259.0; Potassium 4.7; Sodium 138   Lipid Panel    Component Value Date/Time   CHOL 171 11/26/2016 0844   TRIG 99 11/26/2016 0844   TRIG 183 (H) 07/26/2006 0903   HDL 90 11/26/2016 0844   CHOLHDL 1.9 11/26/2016 0844   CHOLHDL 2.2 07/04/2014  0528   VLDL 19 07/04/2014 0528   LDLCALC 61 11/26/2016 0844   LDLDIRECT 89.1 06/29/2013 1604    Additional studies/ records that were reviewed today include:   Echocardiogram 09/16/15: Study Conclusions   - Left ventricle: The cavity size was normal. Wall thickness was   normal. Systolic function was normal. The estimated ejection   fraction was in the range of 55% to 60%. Wall motion was normal;   there were no regional wall  motion abnormalities. - Mitral valve: There was mild regurgitation. - Left atrium: The atrium was mildly dilated. - Right atrium: The atrium was mildly dilated. - Tricuspid valve: There was mild-moderate regurgitation directed   centrally. - Pulmonary arteries: Systolic pressure was mildly increased. PA   peak pressure: 38 mm Hg (S).  Nuclear Stress study 08/25/2016: Study Highlights   Nuclear stress EF: 66%.  This is a low risk study.  The left ventricular ejection fraction is normal (55-65%).  Defect 1: There is a medium defect of moderate severity present in the mid anteroseptal, mid inferoseptal, mid inferolateral, apical anterior, apical inferior, apical lateral and apex location.  This defect likely represents breast attenuation artifact. The defect is fixed and the apex of the LV contracts vigorously .   Coronary calcification noted on CT angiogram of the chest performed in 2014.     ASSESSMENT:    1. PAF (paroxysmal atrial fibrillation) ([place])   2. CAD in native artery   3. Essential hypertension      PLAN:  In order of problems listed above:  1. Presumed still paroxysmal. ECG March 2017 reveals sinus bradycardia with first-degree AV block. Assumption is that the recent recurrence of severe weakness and fatigue is related to recurrent AF. However, she for the most part has been in atrial fibrillation almost prior EKGs with the exception of 2014 tracings and the 2017 tracing. I am recommending that she undergo electrical cardioversion. The  opportunity to maintain sinus rhythm may be relatively limited. Will likely need referral to EP for consideration of ablation or advice concerning long-term antiarrhythmic therapy. 2. Current symptoms are concerning and initially felt to be related to atrial fibrillation. On further review of records, perhaps this is ischemia mediated. She has an abnormal myocardial perfusion study from 2017 and coronary calcification noted on CT. We need to exclude three-vessel coronary disease before exposing her to electrical cardioversion. 3. The blood pressure is well controlled.  We have scheduled the patient for elective electrical cardioversion. After consideration however I will counsel this recommendation and consider coronary angiography. I will see with the patient concerning this. For the time being we will continue anticoagulation therapy. We will hold prior to angiography. If cath does not reveal significant obstructive disease I will then refer to EP and consider anti-arrhythmic therapy prior to cardioversion.    Medication Adjustments/Labs and Tests Ordered: Current medicines are reviewed at length with the patient today.  Concerns regarding medicines are outlined above.  Medication changes, Labs and Tests ordered today are listed in the Patient Instructions below. Patient Instructions  Medication Instructions:  Your physician recommends that you continue on your current medications as directed. Please refer to the Current Medication list given to you today.   Labwork: BMET, CBC and INR today  Testing/Procedures: Your physician has recommended that you have a Cardioversion (DCCV). Electrical Cardioversion uses a jolt of electricity to your heart either through paddles or wired patches attached to your chest. This is a controlled, usually prescheduled, procedure. Defibrillation is done under light anesthesia in the hospital, and you usually go home the day of the procedure. This is done to get your  heart back into a normal rhythm. You are not awake for the procedure. Please see the instruction sheet given to you today.   Follow-Up: Your physician recommends that you schedule a follow-up appointment in: 2-3 weeks after Cardioversion.    Any Other Special Instructions Will Be Listed Below (If Applicable).     If you  need a refill on your cardiac medications before your next appointment, please call your pharmacy.      Signed, Sinclair Grooms, MD  06/12/2017 1:34 PM    [facility] [place], [place],   [address] Phone: [phone]; Fax: [phone]

## 2017-07-01 NOTE — Interval H&P Note (Signed)
Cath Lab Visit (complete for each Cath Lab visit)  Clinical Evaluation Leading to the Procedure:   ACS: No.  Non-ACS:    Anginal Classification: CCS III  Anti-ischemic medical therapy: Maximal Therapy (2 or more classes of medications)  Non-Invasive Test Results: No non-invasive testing performed  Prior CABG: No previous CABG      History and Physical Interval Note:  07/01/2017 7:33 AM  Hailey [name]  has presented today for surgery, with the diagnosis of cad  The various methods of treatment have been discussed with the patient and family. After consideration of risks, benefits and other options for treatment, the patient has consented to  Procedure(s): CORONARY ATHERECTOMY (N/A) as a surgical intervention .  The patient's history has been reviewed, patient examined, no change in status, stable for surgery.  I have reviewed the patient's chart and labs.  Questions were answered to the patient's satisfaction.    Anginal equivalent symptoms of dyspnea, weakness, and fatigue on exertion associated with diaphoresis.   Belva Crome III

## 2017-07-01 NOTE — Progress Notes (Signed)
Site area: right groin  Site Prior to Removal:  Level 0  Pressure Applied For 20 MINUTES    Minutes Beginning at 1255  Manual:   Yes.    Patient Status During Pull:  stable  Post Pull Groin Site:  Level 0  Post Pull Instructions Given:  Yes.    Post Pull Pulses Present:  Yes.    Dressing Applied:  Yes.    Comments:  Rechecked site during shift with no change, dressing removed and bandaid applied after bedrest at 1430.

## 2017-07-01 NOTE — Progress Notes (Signed)
Patient up to bathroom after bedrest over at 1715. Steady gait, denies any symptoms with ambulation to bathroom. Complained to NT that she felt lightheaded and dizzy, diaphoretic. Blood pressure 66/38. Called to room and patient complained of feeling lightheaded, sitting in chair, diaphoretic. Unable to obtain automatic blood pressure. 1745 manual blood pressure 98/68, patient stated she felt better and denied any symptoms at that time. Resting in lounge chair.

## 2017-07-01 NOTE — Progress Notes (Signed)
#   8. S/W  KATIE @ [employer] [facility] # [phone]   ELIQUIS 5 MG BID   COVER- YES  CO-PAY- $ 45.00 Q/L OF TWO PILLS PER DAY  TIER- 3 DRUG  PRIOR APPROVAL- NO   PHARMACY : ANY RETAIL

## 2017-07-02 ENCOUNTER — Encounter (HOSPITAL_COMMUNITY): Payer: Self-pay | Admitting: Cardiology

## 2017-07-02 ENCOUNTER — Encounter: Payer: Self-pay | Admitting: Internal Medicine

## 2017-07-02 ENCOUNTER — Telehealth: Payer: Self-pay | Admitting: *Deleted

## 2017-07-02 DIAGNOSIS — Z87891 Personal history of nicotine dependence: Secondary | ICD-10-CM | POA: Diagnosis not present

## 2017-07-02 DIAGNOSIS — Z7901 Long term (current) use of anticoagulants: Secondary | ICD-10-CM | POA: Diagnosis not present

## 2017-07-02 DIAGNOSIS — K219 Gastro-esophageal reflux disease without esophagitis: Secondary | ICD-10-CM | POA: Diagnosis not present

## 2017-07-02 DIAGNOSIS — I10 Essential (primary) hypertension: Secondary | ICD-10-CM | POA: Diagnosis not present

## 2017-07-02 DIAGNOSIS — I25119 Atherosclerotic heart disease of native coronary artery with unspecified angina pectoris: Secondary | ICD-10-CM

## 2017-07-02 DIAGNOSIS — Z955 Presence of coronary angioplasty implant and graft: Secondary | ICD-10-CM

## 2017-07-02 DIAGNOSIS — I25118 Atherosclerotic heart disease of native coronary artery with other forms of angina pectoris: Secondary | ICD-10-CM | POA: Diagnosis not present

## 2017-07-02 DIAGNOSIS — I480 Paroxysmal atrial fibrillation: Secondary | ICD-10-CM

## 2017-07-02 DIAGNOSIS — I2584 Coronary atherosclerosis due to calcified coronary lesion: Secondary | ICD-10-CM | POA: Diagnosis not present

## 2017-07-02 DIAGNOSIS — K589 Irritable bowel syndrome without diarrhea: Secondary | ICD-10-CM | POA: Diagnosis not present

## 2017-07-02 DIAGNOSIS — I491 Atrial premature depolarization: Secondary | ICD-10-CM | POA: Diagnosis not present

## 2017-07-02 DIAGNOSIS — Z882 Allergy status to sulfonamides status: Secondary | ICD-10-CM | POA: Diagnosis not present

## 2017-07-02 DIAGNOSIS — M8580 Other specified disorders of bone density and structure, unspecified site: Secondary | ICD-10-CM | POA: Diagnosis not present

## 2017-07-02 LAB — CBC
HCT: 31.6 % — ABNORMAL LOW (ref 36.0–46.0)
Hemoglobin: 10.5 g/dL — ABNORMAL LOW (ref 12.0–15.0)
MCH: 31.9 pg (ref 26.0–34.0)
MCHC: 33.2 g/dL (ref 30.0–36.0)
MCV: 96 fL (ref 78.0–100.0)
Platelets: 215 10*3/uL (ref 150–400)
RBC: 3.29 MIL/uL — ABNORMAL LOW (ref 3.87–5.11)
RDW: 14.6 % (ref 11.5–15.5)
WBC: 6.4 10*3/uL (ref 4.0–10.5)

## 2017-07-02 LAB — BASIC METABOLIC PANEL
Anion gap: 7 (ref 5–15)
BUN: 16 mg/dL (ref 6–20)
CO2: 20 mmol/L — ABNORMAL LOW (ref 22–32)
Calcium: 8.6 mg/dL — ABNORMAL LOW (ref 8.9–10.3)
Chloride: 108 mmol/L (ref 101–111)
Creatinine, Ser: 1.15 mg/dL — ABNORMAL HIGH (ref 0.44–1.00)
GFR calc Af Amer: 51 mL/min — ABNORMAL LOW (ref >60)
GFR calc non Af Amer: 44 mL/min — ABNORMAL LOW (ref >60)
Glucose, Bld: 87 mg/dL (ref 65–99)
Potassium: 3.7 mmol/L (ref 3.5–5.1)
Sodium: 135 mmol/L (ref 135–145)

## 2017-07-02 MED ORDER — ATORVASTATIN CALCIUM 80 MG PO TABS: 80.0000 mg | ORAL_TABLET | Freq: Every day | ORAL | 1 refills | Status: DC

## 2017-07-02 MED ORDER — NITROGLYCERIN 0.4 MG SL SUBL: 0.4000 mg | SUBLINGUAL_TABLET | SUBLINGUAL | 2 refills | Status: DC | PRN

## 2017-07-02 MED ORDER — PANTOPRAZOLE SODIUM 40 MG PO TBEC: 40.0000 mg | DELAYED_RELEASE_TABLET | Freq: Every day | ORAL | 4 refills | Status: DC

## 2017-07-02 MED FILL — Verapamil HCl IV Soln 2.5 MG/ML: INTRAVENOUS | Qty: 2 | Status: AC

## 2017-07-02 NOTE — Discharge Summary (Signed)
Discharge Summary    Patient ID: AMAMDA CURBOW,  MRN: [id number], DOB/AGE: [birth year] 80 y.o.  Admit date: 07/01/2017 Discharge date: 07/02/2017  Primary Care Provider: Cassandria Anger Primary Cardiologist: Tamala Julian   Discharge Diagnoses    Principal Problem:   Coronary artery disease involving native coronary artery of native heart with angina pectoris [facility]) Active Problems:   Essential hypertension   TOBACCO USE, QUIT   PAF (paroxysmal atrial fibrillation) (HCC)   Chronic anticoagulation   Allergies Allergies  Allergen Reactions  . Macrobid [employer] Macro] Other (See Comments)    Nausea, stomach cramps, fatigue , headache.  . Meloxicam Other (See Comments)    Jittery and headache  . Digoxin And Related     headaches  . Sulfamethoxazole-Trimethoprim Nausea Only    Diagnostic Studies/Procedures    Cath: 07/01/17  Conclusion    Successful PCI and stent of a heavily calcified right coronary reducing a proximal to mid 85% stenosis to 0% with with TIMI grade 3 flow. An Onyx 4.0 x 26 mm DES was post.-dilated to 4.5 mm in diameter.  RECOMMENDATIONS:   Aspirin, Plavix, and Eliquis for 1 month then drop aspirin.  Plavix and Eliquis x 5 months then drop plavix.  Resume Eliquis in AM 07/02/2017.   _____________   History of Present Illness     82 y.o. female who presented to the office for follow up with Dr. Tamala Julian for PSVT, paroxysmal atrial fibrillation, chronic anticoagulation, and hypertension.  2-3 week history of extreme fatigue, lightheadedness, and weakness. She felt her heart had been beating faster than usual. She denied chest pain. There was some exertional dyspnea. She had been under extreme emotional stress since the summer because of the death of her son. It was felt that her symptoms are likely related to atrial fibrillation, but in review of further records Dr. Tamala Julian was concerned this could be ischemia mediated. Did have an  abnormal myocardial perfusion study in 2017, and calcification noted on CT. Given these findings she was set up for outpatient cardiac cath. Underwent cath with Dr. Tamala Julian on 06/26/17 which noted severe three-vessel disease particularly in the right coronary artery, 85-90% proximal lesion. She was started on Plavix post cath, and resumed on Eliquis, with plans for orbital atherectomy scheduled on 07/01/17.  Hospital Course     Underwent successful PCI and DES of heavily calcified RCA reducing P/M stenosis to 0%. We'll plan for triple therapy with aspirin, Plavix and I will request for 1 month, then plan to DC aspirin at that time. Continue aspirin and Plavix for 5 months, then stop Plavix. Did have brief episode of hypotension post sheath removal which quickly resolved. No other complication noted post cath. This cath labs showed stable creatinine 1.15 and hemoglobin 10.5. Worked well with cardiac rehabilitation with no complaints.  KHADIJATOU BORAK was seen by Dr. Ellyn Hack and determined stable for discharge home. Follow up in the office has been arranged. Medications are listed below.   _____________  Discharge Vitals Blood pressure 134/79, pulse 70, temperature 97.7 F (36.5 C), temperature source Oral, resp. rate 18, height 5\' 4"  (1.626 m), weight 168 lb 14 oz (76.6 kg), SpO2 97 %.  Filed Weights   07/01/17 0551 07/02/17 0500  Weight: 170 lb (77.1 kg) 168 lb 14 oz (76.6 kg)   General appearance: alert, cooperative, appears stated age, no distress and resting comfortably Neck: no adenopathy, no carotid bruit and no JVD Lungs: clear to auscultation bilaterally, normal percussion  bilaterally and non-labored Heart: Irreg-Irreg, controlled rate.  No MR/G. Non-displaced PMI. Abdomen: soft, non-tender; bowel sounds normal; no masses,  no organomegaly Extremities: extremities normal, atraumatic, no cyanosis or edema and radial access site - mild bruising - good pulse. Pulses: 2+ and  symmetric Neurologic: Grossly normal   Labs & Radiologic Studies    CBC  Recent Labs  07/01/17 0632 07/02/17 0835  WBC 5.3 6.4  HGB 12.7 10.5*  HCT 37.9 31.6*  MCV 95.0 96.0  PLT 259 098   Basic Metabolic Panel  Recent Labs  07/01/17 0632 07/02/17 0835  NA 133* 135  K 4.1 3.7  CL 106 108  CO2 20* 20*  GLUCOSE 98 87  BUN 20 16  CREATININE 1.28* 1.15*  CALCIUM 8.9 8.6*   Liver Function Tests No results for input(s): AST, ALT, ALKPHOS, BILITOT, PROT, ALBUMIN in the last 72 hours. No results for input(s): LIPASE, AMYLASE in the last 72 hours. Cardiac Enzymes No results for input(s): CKTOTAL, CKMB, CKMBINDEX, TROPONINI in the last 72 hours. BNP Invalid input(s): POCBNP D-Dimer No results for input(s): DDIMER in the last 72 hours. Hemoglobin A1C No results for input(s): HGBA1C in the last 72 hours. Fasting Lipid Panel No results for input(s): CHOL, HDL, LDLCALC, TRIG, CHOLHDL, LDLDIRECT in the last 72 hours. Thyroid Function Tests No results for input(s): TSH, T4TOTAL, T3FREE, THYROIDAB in the last 72 hours.  Invalid input(s): FREET3 _____________  No results found. Disposition   Pt is being discharged home today in good condition.  Follow-up Plans & Appointments    Follow-up Information    Burtis Junes, NP Follow up on 07/09/2017.   Specialties:  Nurse Practitioner, Interventional Cardiology, Cardiology, Radiology Why:  at [facility] for your follow up appt.  Contact information: [place]. [address] [place] [address] [phone]          Discharge Instructions    Amb Referral to Cardiac Rehabilitation    Complete by:  As directed    Diagnosis:  Coronary Stents   Call MD for:  redness, tenderness, or signs of infection (pain, swelling, redness, odor or green/yellow discharge around incision site)    Complete by:  As directed    Diet - low sodium heart healthy    Complete by:  As directed    Discharge instructions    Complete by:   As directed    Groin Site Care Refer to this sheet in the next few weeks. These instructions provide you with information on caring for yourself after your procedure. Your caregiver may also give you more specific instructions. Your treatment has been planned according to current medical practices, but problems sometimes occur. Call your caregiver if you have any problems or questions after your procedure. HOME CARE INSTRUCTIONS You may shower 24 hours after the procedure. Remove the bandage (dressing) and gently wash the site with plain soap and water. Gently pat the site dry.  Do not apply powder or lotion to the site.  Do not sit in a bathtub, swimming pool, or whirlpool for 5 to 7 days.  No bending, squatting, or lifting anything over 10 pounds (4.5 kg) as directed by your caregiver.  Inspect the site at least twice daily.  Do not drive home if you are discharged the same day of the procedure. Have someone else drive you.  You may drive 24 hours after the procedure unless otherwise instructed by your caregiver.  What to expect: Any bruising will usually fade within 1 to  2 weeks.  Blood that collects in the tissue (hematoma) may be painful to the touch. It should usually decrease in size and tenderness within 1 to 2 weeks.  SEEK IMMEDIATE MEDICAL CARE IF: You have unusual pain at the groin site or down the affected leg.  You have redness, warmth, swelling, or pain at the groin site.  You have drainage (other than a small amount of blood on the dressing).  You have chills.  You have a fever or persistent symptoms for more than 72 hours.  You have a fever and your symptoms suddenly get worse.  Your leg becomes pale, cool, tingly, or numb.  You have heavy bleeding from the site. Hold pressure on the site. Marland Kitchen  PLEASE DO NOT MISS ANY DOSES OF YOUR PLAVIX!!!!! Also keep a log of you blood pressures and bring back to your follow up appt. Please call the office with any questions.   Patients  taking blood thinners should generally stay away from medicines like ibuprofen, Advil, Motrin, naproxen, and Aleve due to risk of stomach bleeding. You may take Tylenol as directed or talk to your primary doctor about alternatives.  Some studies suggest Prilosec/Omeprazole interacts with Plavix. We changed your Prilosec/Omeprazole to the equivalent dose of Protonix for less chance of interaction.   Will plan for aspirin, plavix, and eliquis for one month, then plan to stop aspirin and continue plavix and eliquis. Please monitor for signs of bleeding with these medications and call the office with any questions.   Increase activity slowly    Complete by:  As directed       Discharge Medications     Medication List    STOP taking these medications   esomeprazole 40 MG capsule Commonly known as:  [place] by:  pantoprazole 40 MG tablet   pravastatin 20 MG tablet Commonly known as:  PRAVACHOL     TAKE these medications   aspirin EC 81 MG tablet Take 81 mg by mouth daily.   atorvastatin 80 MG tablet Commonly known as:  LIPITOR Take 1 tablet (80 mg total) by mouth daily at 6 PM.   cetirizine 10 MG tablet Commonly known as:  ZYRTEC Take 10 mg by mouth daily as needed for allergies or rhinitis.   clobetasol cream 0.05 % Commonly known as:  TEMOVATE APPLY NIGHTLY AS DIRECTED. What changed:  how much to take  how to take this  when to take this  reasons to take this  additional instructions   clopidogrel 75 MG tablet Commonly known as:  PLAVIX Take 1 tablet (75 mg total) by mouth daily.   diclofenac sodium 1 % Gel Commonly known as:  VOLTAREN APPLY 2 GRAMS TOPICALLY 4 TIMES DAILY. What changed:  See the new instructions.   diphenoxylate-atropine 2.5-0.025 MG tablet Commonly known as:  LOMOTIL Take 1 tablet by mouth 4 (four) times daily as needed for diarrhea or loose stools.   DULoxetine 60 MG capsule Commonly known as:  CYMBALTA TAKE (1) CAPSULE  DAILY. What changed:  how much to take  how to take this  when to take this  additional instructions   ELIQUIS 5 MG Tabs tablet Generic drug:  apixaban TAKE 1 TABLET TWICE DAILY. What changed:  See the new instructions.   ketotifen 0.025 % ophthalmic solution Commonly known as:  ZADITOR Place 1 drop into both eyes daily as needed (ITCHING).   linaclotide 290 MCG Caps capsule Commonly known as:  LINZESS Take 1 capsule (290 mcg total)  by mouth daily as needed. What changed:  reasons to take this   LORazepam 1 MG tablet Commonly known as:  ATIVAN TAKE (1) TABLET TWICE A DAY AS NEEDED. What changed:  See the new instructions.   metoprolol succinate 50 MG 24 hr tablet Commonly known as:  TOPROL-XL TAKE 1 TABLET TWICE DAILY. What changed:  See the new instructions.   nitroGLYCERIN 0.4 MG SL tablet Commonly known as:  NITROSTAT Place 1 tablet (0.4 mg total) under the tongue every 5 (five) minutes as needed.   pantoprazole 40 MG tablet Commonly known as:  PROTONIX Take 1 tablet (40 mg total) by mouth daily. Replaces:  esomeprazole 40 MG capsule   promethazine 25 MG tablet Commonly known as:  PHENERGAN Take 1 tablet (25 mg total) by mouth every 8 (eight) hours as needed for nausea or vomiting.   sodium chloride 0.65 % Soln nasal spray Commonly known as:  OCEAN Place 1 spray into both nostrils 2 (two) times daily as needed for congestion.        Aspirin prescribed at discharge?  Yes High Intensity Statin Prescribed? (Lipitor 40-80mg  or Crestor 20-40mg ): Yes Beta Blocker Prescribed? Yes For EF <40%, was ACEI/ARB Prescribed? No: EF ok ADP Receptor Inhibitor Prescribed? (i.e. Plavix etc.-Includes Medically Managed Patients): Yes For EF <40%, Aldosterone Inhibitor Prescribed? No: EF ok Was EF assessed during THIS hospitalization? No, done at cath on 10/17 Was Cardiac Rehab II ordered? (Included Medically managed Patients): Yes   Outstanding Labs/Studies   FLP/LFTs  in 6 weeks if tolerating statin, CBC at follow up appt.   Duration of Discharge Encounter   Greater than 30 minutes including physician time.  Signed, Reino Bellis NP-C 07/02/2017, 10:03 AM   I have seen, examined and evaluated the patient this AM. along with Reino Bellis, NP-C.  After reviewing all the available data and chart, we discussed the patients laboratory, study & physical findings as well as symptoms in detail. I agree with her findings, examination (performed by me), as well as impression, summary & d/c recommendations as per our discussion.    Attending adjustments noted in italics.   Doing well after Staged Orbital Atherectomy-Based RCA PCI for progressive exertional Dyspnea (as Anginal Equivalent)  Mild GERD & mild orthostatic hypotension this AM, but did well with Cardiac Rehab.  OK for d/c    Glenetta Hew, M.D., M.S. Interventional Cardiologist   Pager # [phone] Phone # [phone] [address] [place], [place] [address]

## 2017-07-02 NOTE — Progress Notes (Signed)
CARDIAC REHAB PHASE I   PRE:  Rate/Rhythm: 27 SR  BP:  Supine: 136/76  Sitting:   Standing:    SaO2:   MODE:  Ambulation: 500 ft   POST:  Rate/Rhythm: 104 ST  BP:  Supine:   Sitting: 160/88  Standing:    SaO2: 93%RA 0905-1005 Pt walked 500 ft on RA with steady gait. Tolerated well. Denied CP or SOB. Education completed with pt who voiced understanding. Stressed importance of plavix with stent. Discussed NTG use, ex ed, heart healthy eating, and CRP 2. Will refer to [facility] program.   Graylon Good, RN BSN  07/02/2017 9:59 AM

## 2017-07-02 NOTE — Telephone Encounter (Signed)
Pt was on TCM list admitted 07/01/17 for Coronary artery disease involving native coronary artery of native heart with angina pectoris . Underwent cath with Dr. Tamala Julian on 06/26/17 which noted severe three-vessel disease particularly in the right coronary artery, 85-90% proximal lesion. She was started on Plavix post cath, and resumed on Eliquis, with plans for orbital atherectomy scheduled on 07/01/17.Marland Kitchen Pt was D/C 07/01/17 and will f/u w/cardiology  Burtis Junes, NP Follow up on 07/09/2017.   Specialties:  Nurse Practitioner, Interventional Cardiology,

## 2017-07-03 ENCOUNTER — Telehealth (HOSPITAL_COMMUNITY): Payer: Self-pay

## 2017-07-03 NOTE — Telephone Encounter (Signed)
Patient insurance is active and benefits verified. Patient insurance is [facility] Medicare - $20.00 co-pay, no deductible, out of pocket $4400/$776.08 has been met, no co-insurance and no pre-authorization. Passport/reference [phone].   Patient will be contacted and scheduled after their follow up appointment with the cardiologist office on 07/09/17, upon review by [facility] RN navigator.

## 2017-07-04 ENCOUNTER — Ambulatory Visit: Payer: Medicare Other | Admitting: Sports Medicine

## 2017-07-09 ENCOUNTER — Encounter: Payer: Self-pay | Admitting: Nurse Practitioner

## 2017-07-09 ENCOUNTER — Ambulatory Visit (INDEPENDENT_AMBULATORY_CARE_PROVIDER_SITE_OTHER): Payer: Medicare Other | Admitting: Nurse Practitioner

## 2017-07-09 VITALS — BP 110/78 | HR 57 | Ht 64.0 in | Wt 171.0 lb

## 2017-07-09 DIAGNOSIS — I10 Essential (primary) hypertension: Secondary | ICD-10-CM | POA: Diagnosis not present

## 2017-07-09 DIAGNOSIS — I2510 Atherosclerotic heart disease of native coronary artery without angina pectoris: Secondary | ICD-10-CM | POA: Diagnosis not present

## 2017-07-09 DIAGNOSIS — Z955 Presence of coronary angioplasty implant and graft: Secondary | ICD-10-CM | POA: Diagnosis not present

## 2017-07-09 DIAGNOSIS — I480 Paroxysmal atrial fibrillation: Secondary | ICD-10-CM | POA: Diagnosis not present

## 2017-07-09 LAB — BASIC METABOLIC PANEL
BUN/Creatinine Ratio: 20 (ref 12–28)
BUN: 24 mg/dL (ref 8–27)
CO2: 21 mmol/L (ref 20–29)
Calcium: 9.5 mg/dL (ref 8.7–10.3)
Chloride: 99 mmol/L (ref 96–106)
Creatinine, Ser: 1.21 mg/dL — ABNORMAL HIGH (ref 0.57–1.00)
GFR calc Af Amer: 49 mL/min/{1.73_m2} — ABNORMAL LOW (ref >59)
GFR calc non Af Amer: 42 mL/min/{1.73_m2} — ABNORMAL LOW (ref >59)
Glucose: 91 mg/dL (ref 65–99)
Potassium: 4.6 mmol/L (ref 3.5–5.2)
Sodium: 135 mmol/L (ref 134–144)

## 2017-07-09 LAB — CBC
Hematocrit: 31.4 % — ABNORMAL LOW (ref 34.0–46.6)
Hemoglobin: 10.5 g/dL — ABNORMAL LOW (ref 11.1–15.9)
MCH: 32.3 pg (ref 26.6–33.0)
MCHC: 33.4 g/dL (ref 31.5–35.7)
MCV: 97 fL (ref 79–97)
Platelets: 332 10*3/uL (ref 150–379)
RBC: 3.25 x10E6/uL — ABNORMAL LOW (ref 3.77–5.28)
RDW: 14 % (ref 12.3–15.4)
WBC: 6 10*3/uL (ref 3.4–10.8)

## 2017-07-09 NOTE — Progress Notes (Signed)
CARDIOLOGY OFFICE NOTE  Date:  07/09/2017    Hailey [name] Date of Birth: December 28, 1934 Medical Record #[id number]  PCP:  Cassandria Anger, MD  Cardiologist:  Tamala Julian  Chief Complaint  Patient presents with  . Coronary Artery Disease    Post hospital visit - seen for Dr. Tamala Julian    History of Present Illness: Hailey [name] is a 82 y.o. female who presents today for a post hospital visit. Seen for Dr. Tamala Julian. She goes by "Corky".   She has a history of PSVT, paroxysmal atrial fibrillation on chronic anticoagulation, and hypertension.  Seen by Dr. Tamala Julian earlier this month with extreme fatigue, lightheadedness and weakness. + palpitations. Lots of emotional stress noted with death of her son. Referred on initially for cardioversion - but in light of past abnormal Myoview and coronary calcification seen on prior imaging - cardiac cath was recommended - see below. Now s/p athrectomy of the RCA. Noted 3V CAD. She is on triple therapy anticoagulation for 1 months - then aspirin will be stopped and DAPT will be continued.   Comes in today. Here alone. She notes that she does feel better. Feels about 75% better overall. No chest pain - really never had. She has tried to do some walking - went too far yesterday and had to stop - got a little weak and short of breath - it passed with just resting. She is interested in cardiac rehab. She has lots of bruising - had this before with just the Eliquis - but now is worse. Remains on triple therapy. Tolerating her medicines ok. Does not sleep well - she admits to drinking alcohol in the evening - this may be part of the issue - she notes it was worse with wine and not so much with vodka. Both cath sites with some bruising noted but soft.   Past Medical History:  Diagnosis Date  . Allergic rhinitis   . Anxiety   . Arthritis    "my whole spine" (07/01/2017)  . Atrial fibrillation ([place])   . Coronary artery disease    10/18 PCI/DES to p/m LCx  with cutting balloon to mLcx  . Diverticulosis of colon   . GERD (gastroesophageal reflux disease)   . Hip bursitis 2010   Dr Para March, Post op seroma  . History of colon polyps   . HTN (hypertension)   . IBS (irritable bowel syndrome)    constipation predominant - Dr Earlean Shawl  . Lichen sclerosus   . Osteopenia 11/2016   T score -2.0 FRAX 15%/4.3%  . PAC (premature atrial contraction)    Symptomatiic  . Scoliosis   . SVT (supraventricular tachycardia) ([place])    brief history    Past Surgical History:  Procedure Laterality Date  . ANTERIOR AND POSTERIOR VAGINAL REPAIR  01/2002   Archie Endo 01/23/2011  . APPENDECTOMY  1948  . CARDIAC CATHETERIZATION  06/26/2017  . CORONARY ANGIOPLASTY WITH STENT PLACEMENT  07/01/2017  . CORONARY ATHERECTOMY N/A 07/01/2017   Procedure: CORONARY ATHERECTOMY;  Surgeon: Belva Crome, MD;  Location: [facility];  Service: Cardiovascular;  Laterality: N/A;  . CORONARY STENT INTERVENTION N/A 07/01/2017   Procedure: CORONARY STENT INTERVENTION;  Surgeon: Belva Crome, MD;  Location: [facility];  Service: Cardiovascular;  Laterality: N/A;  . HAMMER TOE SURGERY    . HEMORRHOID BANDING    . HIP SURGERY Left 04/2009   hip examination under anesthesia followed by greater trochanteric bursectomy; iliotibial band tenotomy/notes  01/20/2011  . KNEE BURSECTOMY Right 04/2009   Archie Endo 01/09/2011  . LEFT HEART CATH AND CORONARY ANGIOGRAPHY N/A 06/26/2017   Procedure: LEFT HEART CATH AND CORONARY ANGIOGRAPHY;  Surgeon: Belva Crome, MD;  Location: [facility];  Service: Cardiovascular;  Laterality: N/A;  . PUBOVAGINAL SLING  01/2002   Archie Endo 01/23/2011  . REDUCTION MAMMAPLASTY    . TEMPORARY PACEMAKER N/A 07/01/2017   Procedure: TEMPORARY PACEMAKER;  Surgeon: Belva Crome, MD;  Location: [facility];  Service: Cardiovascular;  Laterality: N/A;  . VAGINAL HYSTERECTOMY  01/2002   Vaginal hysterectomy, bilateral salpingo-oophorectomy/notes  01/23/2011     Medications: Current Meds  Medication Sig  . aspirin EC 81 MG tablet Take 81 mg by mouth daily.  Marland Kitchen atorvastatin (LIPITOR) 80 MG tablet Take 1 tablet (80 mg total) by mouth daily at 6 PM.  . cetirizine (ZYRTEC) 10 MG tablet Take 10 mg by mouth daily as needed for allergies or rhinitis.   . clobetasol cream (TEMOVATE) 0.05 % APPLY NIGHTLY AS DIRECTED. (Patient taking differently: Apply 1 application topically daily as needed (irritation). APPLY NIGHTLY AS DIRECTED.)  . clopidogrel (PLAVIX) 75 MG tablet Take 1 tablet (75 mg total) by mouth daily.  . diclofenac sodium (VOLTAREN) 1 % GEL APPLY 2 GRAMS TOPICALLY 4 TIMES DAILY. (Patient taking differently: APPLY 2 GRAMS TOPICALLY 4 TIMES DAILY AS NEEDED FOR PAIN)  . diphenoxylate-atropine (LOMOTIL) 2.5-0.025 MG tablet Take 1 tablet by mouth 4 (four) times daily as needed for diarrhea or loose stools.  . DULoxetine (CYMBALTA) 60 MG capsule TAKE (1) CAPSULE DAILY. (Patient taking differently: Take 60 mg by mouth daily. TAKE (1) CAPSULE DAILY.)  . ELIQUIS 5 MG TABS tablet TAKE 1 TABLET TWICE DAILY. (Patient taking differently: TAKE 5 MG TABLET TWICE DAILY.)  . ketotifen (ZADITOR) 0.025 % ophthalmic solution Place 1 drop into both eyes daily as needed (ITCHING).  Marland Kitchen linaclotide (LINZESS) 290 MCG CAPS capsule Take 1 capsule (290 mcg total) by mouth daily as needed. (Patient taking differently: Take 290 mcg by mouth daily as needed (CONSTIPATION). )  . LORazepam (ATIVAN) 1 MG tablet TAKE (1) TABLET TWICE A DAY AS NEEDED. (Patient taking differently: TAKE 1 MG TABLET TWICE A DAY AS NEEDED FOR SLEEP)  . metoprolol succinate (TOPROL-XL) 50 MG 24 hr tablet TAKE 1 TABLET TWICE DAILY. (Patient taking differently: TAKE 50 MG TABLET BY MOUTH TWICE DAILY.)  . nitroGLYCERIN (NITROSTAT) 0.4 MG SL tablet Place 1 tablet (0.4 mg total) under the tongue every 5 (five) minutes as needed.  . pantoprazole (PROTONIX) 40 MG tablet Take 1 tablet (40 mg total) by  mouth daily.  . promethazine (PHENERGAN) 25 MG tablet Take 1 tablet (25 mg total) by mouth every 8 (eight) hours as needed for nausea or vomiting.  . sodium chloride (OCEAN) 0.65 % SOLN nasal spray Place 1 spray into both nostrils 2 (two) times daily as needed for congestion.     Allergies: Allergies  Allergen Reactions  . Macrobid [employer] Macro] Other (See Comments)    Nausea, stomach cramps, fatigue , headache.  . Meloxicam Other (See Comments)    Jittery and headache  . Digoxin And Related     headaches  . Sulfamethoxazole-Trimethoprim Nausea Only    Social History: The patient  reports that she quit smoking about 37 years ago. Her smoking use included Cigarettes. She has a 7.00 pack-year smoking history. She has never used smokeless tobacco. She reports that she drinks about 12.6 oz of  alcohol per week . She reports that she does not use drugs.   Family History: The patient's family history includes Cancer in her brother, father, and son; Colon cancer in her mother; Diabetes in her father.   Review of Systems: Please see the history of present illness.   Otherwise, the review of systems is positive for none.   All other systems are reviewed and negative.   Physical Exam: VS:  BP 110/78   Pulse (!) 57   Ht 5\' 4"  (1.626 m)   Wt 171 lb (77.6 kg)   BMI 29.35 kg/m  .  BMI Body mass index is 29.35 kg/m.  Wt Readings from Last 3 Encounters:  07/09/17 171 lb (77.6 kg)  07/02/17 168 lb 14 oz (76.6 kg)  06/26/17 169 lb (76.7 kg)    General: Pleasant. Well developed, well nourished and in no acute distress.  She looks younger than her stated age.  HEENT: Normal.  Neck: Supple, no JVD, carotid bruits, or masses noted.  Cardiac: Regular rate and rhythm. No murmurs, rubs, or gallops. No edema.  Respiratory:  Lungs are clear to auscultation bilaterally with normal work of breathing.  GI: Soft and nontender.  MS: No deformity or atrophy. Gait and ROM intact.    Skin: Warm and dry. Color is normal. She has lots of bruising on arms/legs/hands noted.  Neuro:  Strength and sensation are intact and no gross focal deficits noted.  Psych: Alert, appropriate and with normal affect.   LABORATORY DATA:  EKG:  EKG is ordered today. This demonstrates sinus bradycardia.  Lab Results  Component Value Date   WBC 6.4 07/02/2017   HGB 10.5 (L) 07/02/2017   HCT 31.6 (L) 07/02/2017   PLT 215 07/02/2017   GLUCOSE 87 07/02/2017   CHOL 171 11/26/2016   TRIG 99 11/26/2016   HDL 90 11/26/2016   LDLDIRECT 89.1 06/29/2013   LDLCALC 61 11/26/2016   ALT 9 09/13/2016   AST 18 09/13/2016   NA 135 07/02/2017   K 3.7 07/02/2017   CL 108 07/02/2017   CREATININE 1.15 (H) 07/02/2017   BUN 16 07/02/2017   CO2 20 (L) 07/02/2017   TSH 2.11 04/29/2015   INR 1.0 06/12/2017   HGBA1C 5.6 07/04/2014     BNP (last 3 results)  Recent Labs  08/01/16 1509  BNP 185.4*    ProBNP (last 3 results) No results for input(s): PROBNP in the last 8760 hours.   Other Studies Reviewed Today:  LEFT HEART CATH AND CORONARY ANGIOGRAPHY 06/2017  Conclusion    Severe diffuse three-vessel coronary calcification, particularly have in the right coronary artery.  85-90% proximal followed by generalized 50-60% mid RCA calcified stenosis.  Distal left main 25%. Heavily calcified.  Irregularities in the proximal and mid LAD up to 50% . Heavily calcified.  Circumflex artery is relatively small and is free of any significant obstruction.   Normal left ventricular systolic function with normal left ventricular end-diastolic pressure. EF 60%.  RECOMMENDATIONS:  Plavix is started today.  Resume Eliquis in a.m. Discontinue eloquent's after p.m. dose on Friday.  Orbital atherectomy will be planned for 07/01/2017 probably from right femoral approach with temporary pacemaker insertion to avoid bradycardia.    Atherectomy Conclusion     Dist RCA lesion, 50 %stenosed.     Successful PCI with Orbital atherectomy followed by stenting of a heavily calcified right coronary reducing a proximal to mid 85% stenosis to 0% with with TIMI grade 3 flow. An Onyx 4.0  x 26 mm DES was post.-dilated to 4.5 mm in diameter.  RECOMMENDATIONS:   Aspirin, Plavix, and Eliquis for 1 month then drop aspirin.  Plavix and Eliquis x 5 months then drop plavix.  Resume Eliquis in AM 07/02/2017.    Echocardiogram 09/16/15: Study Conclusions  - Left ventricle: The cavity size was normal. Wall thickness was normal. Systolic function was normal. The estimated ejection fraction was in the range of 55% to 60%. Wall motion was normal; there were no regional wall motion abnormalities. - Mitral valve: There was mild regurgitation. - Left atrium: The atrium was mildly dilated. - Right atrium: The atrium was mildly dilated. - Tricuspid valve: There was mild-moderate regurgitation directed centrally. - Pulmonary arteries: Systolic pressure was mildly increased. PA peak pressure: 38 mm Hg (S).  Nuclear Stress study 08/25/2016: Study Highlights   Nuclear stress EF: 66%.  This is a low risk study.  The left ventricular ejection fraction is normal (55-65%).  Defect 1: There is a medium defect of moderate severity present in the mid anteroseptal, mid inferoseptal, mid inferolateral, apical anterior, apical inferior, apical lateral and apex location.  This defect likely represents breast attenuation artifact. The defect is fixed and the apex of the LV contracts vigorously .   Coronary calcification noted on CT angiogram of the chest performed in 2014.    Assessment/Plan:  1. Multitude of symptoms - now s/p cath with PCI/athrectomy to the RCA - on triple therapy anticoagulation for one month - then will stop aspirin (after November 23rd). Needs labs today. Message sent to cardiac rehab. Overall, she is doing better. For now, no change in her medicines.   2. 3V CAD-  see above. Needs CV risk factor modification.  Discussed at length.   3. PAF - remains in NSR today by exam and EKG.   4. Chronic anticoagulation - currently on triple therapy - needs labs today. Last HGB was 10.5.   5. HTN - BP ok on current regimen. No changes made today.   6. Situational stress - not discussed.     Current medicines are reviewed with the patient today.  The patient does not have concerns regarding medicines other than what has been noted above.  The following changes have been made:  See above.  Labs/ tests ordered today include:    Orders Placed This Encounter  Procedures  . Basic metabolic panel  . CBC  . EKG 12-Lead     Disposition:   FU with Dr. Tamala Julian in 4 to 6 weeks with fasting labs.   Patient is agreeable to this plan and will call if any problems develop in the interim.   SignedTruitt Merle, NP  07/09/2017 9:41 AM  [facility] [address] [place], [place]  [address] Phone: [phone] Fax: [phone]

## 2017-07-09 NOTE — Patient Instructions (Addendum)
We will be checking the following labs today - BMET and CBC   Medication Instructions:    Continue with your current medicines.   STOP aspirin on November 23rd - continue with your Plavix and Eliquis    Testing/Procedures To Be Arranged:  N/A  Follow-Up:   See Dr. Tamala Julian in 4 to 6 weeks with fasting labs on return    Other Special Instructions:   I have sent a message to cardiac rehab  [facility] to work with Therapist, nutritional.     If you need a refill on your cardiac medications before your next appointment, please call your pharmacy.   Call the [place] office at [phone] if you have any questions, problems or concerns.

## 2017-07-10 ENCOUNTER — Telehealth (HOSPITAL_COMMUNITY): Payer: Self-pay

## 2017-07-10 NOTE — Telephone Encounter (Signed)
Called and spoke with patient in regards to Cardiac Rehab - Patient is interested in the program.. She is currently at the [employer] store getting her phone fixed. She will call back.

## 2017-07-11 ENCOUNTER — Ambulatory Visit (INDEPENDENT_AMBULATORY_CARE_PROVIDER_SITE_OTHER): Payer: Medicare Other | Admitting: Internal Medicine

## 2017-07-11 ENCOUNTER — Encounter: Payer: Self-pay | Admitting: Internal Medicine

## 2017-07-11 DIAGNOSIS — I480 Paroxysmal atrial fibrillation: Secondary | ICD-10-CM

## 2017-07-11 DIAGNOSIS — F329 Major depressive disorder, single episode, unspecified: Secondary | ICD-10-CM | POA: Diagnosis not present

## 2017-07-11 DIAGNOSIS — I10 Essential (primary) hypertension: Secondary | ICD-10-CM | POA: Diagnosis not present

## 2017-07-11 DIAGNOSIS — Z7901 Long term (current) use of anticoagulants: Secondary | ICD-10-CM | POA: Diagnosis not present

## 2017-07-11 DIAGNOSIS — F32A Depression, unspecified: Secondary | ICD-10-CM | POA: Insufficient documentation

## 2017-07-11 MED ORDER — ZOSTER VAC RECOMB ADJUVANTED 50 MCG/0.5ML IM SUSR: 0.5000 mL | Freq: Once | INTRAMUSCULAR | 1 refills | Status: AC

## 2017-07-11 MED ORDER — DULOXETINE HCL 60 MG PO CPEP: ORAL_CAPSULE | ORAL | 5 refills | Status: DC

## 2017-07-11 MED ORDER — DICLOFENAC SODIUM 1 % TD GEL: TRANSDERMAL | 3 refills | Status: DC

## 2017-07-11 NOTE — Assessment & Plan Note (Signed)
Eliquis, ASA x1 mo, Plavix

## 2017-07-11 NOTE — Telephone Encounter (Signed)
Patient returned phone call in regards to Cardiac Rehab - Patient is interested in program. Scheduled orientation 07/30/17 at 1:30pm. Patient will be attending the 11:15am exc class.

## 2017-07-11 NOTE — Assessment & Plan Note (Signed)
Cymbalta 

## 2017-07-11 NOTE — Assessment & Plan Note (Signed)
BP Readings from Last 3 Encounters:  07/11/17 112/78  07/09/17 110/78  07/02/17 136/76

## 2017-07-11 NOTE — Progress Notes (Signed)
Subjective:  Patient ID: Hailey [name], female    DOB: 02-12-1935  Age: 82 y.o. MRN: [id number]  CC: No chief complaint on file.   HPI WYONA NEILS presents for CAD,HTN, grief C/o bruising  Outpatient Medications Prior to Visit  Medication Sig Dispense Refill  . aspirin EC 81 MG tablet Take 81 mg by mouth daily.    Marland Kitchen atorvastatin (LIPITOR) 80 MG tablet Take 1 tablet (80 mg total) by mouth daily at 6 PM. 90 tablet 1  . cetirizine (ZYRTEC) 10 MG tablet Take 10 mg by mouth daily as needed for allergies or rhinitis.     . clobetasol cream (TEMOVATE) 0.05 % APPLY NIGHTLY AS DIRECTED. (Patient taking differently: Apply 1 application topically daily as needed (irritation). APPLY NIGHTLY AS DIRECTED.) 30 g 2  . clopidogrel (PLAVIX) 75 MG tablet Take 1 tablet (75 mg total) by mouth daily. 30 tablet 11  . diclofenac sodium (VOLTAREN) 1 % GEL APPLY 2 GRAMS TOPICALLY 4 TIMES DAILY. (Patient taking differently: APPLY 2 GRAMS TOPICALLY 4 TIMES DAILY AS NEEDED FOR PAIN) 100 g 0  . diphenoxylate-atropine (LOMOTIL) 2.5-0.025 MG tablet Take 1 tablet by mouth 4 (four) times daily as needed for diarrhea or loose stools. 60 tablet 1  . DULoxetine (CYMBALTA) 60 MG capsule TAKE (1) CAPSULE DAILY. (Patient taking differently: Take 60 mg by mouth daily. TAKE (1) CAPSULE DAILY.) 30 capsule 0  . ELIQUIS 5 MG TABS tablet TAKE 1 TABLET TWICE DAILY. (Patient taking differently: TAKE 5 MG TABLET TWICE DAILY.) 180 tablet 2  . ketotifen (ZADITOR) 0.025 % ophthalmic solution Place 1 drop into both eyes daily as needed (ITCHING).    Marland Kitchen linaclotide (LINZESS) 290 MCG CAPS capsule Take 1 capsule (290 mcg total) by mouth daily as needed. (Patient taking differently: Take 290 mcg by mouth daily as needed (CONSTIPATION). ) 30 capsule 5  . LORazepam (ATIVAN) 1 MG tablet TAKE (1) TABLET TWICE A DAY AS NEEDED. (Patient taking differently: TAKE 1 MG TABLET TWICE A DAY AS NEEDED FOR SLEEP) 180 tablet 0  . metoprolol succinate  (TOPROL-XL) 50 MG 24 hr tablet TAKE 1 TABLET TWICE DAILY. (Patient taking differently: TAKE 50 MG TABLET BY MOUTH TWICE DAILY.) 180 tablet 3  . nitroGLYCERIN (NITROSTAT) 0.4 MG SL tablet Place 1 tablet (0.4 mg total) under the tongue every 5 (five) minutes as needed. 25 tablet 2  . pantoprazole (PROTONIX) 40 MG tablet Take 1 tablet (40 mg total) by mouth daily. 30 tablet 4  . promethazine (PHENERGAN) 25 MG tablet Take 1 tablet (25 mg total) by mouth every 8 (eight) hours as needed for nausea or vomiting. 30 tablet 0  . sodium chloride (OCEAN) 0.65 % SOLN nasal spray Place 1 spray into both nostrils 2 (two) times daily as needed for congestion. 15 mL 0   No facility-administered medications prior to visit.     ROS Review of Systems  Constitutional: Positive for fatigue. Negative for activity change, appetite change, chills and unexpected weight change.  HENT: Negative for congestion, mouth sores and sinus pressure.   Eyes: Negative for visual disturbance.  Respiratory: Negative for cough and chest tightness.   Gastrointestinal: Negative for abdominal pain and nausea.  Genitourinary: Negative for difficulty urinating, frequency and vaginal pain.  Musculoskeletal: Negative for back pain and gait problem.  Skin: Positive for color change. Negative for pallor and rash.  Neurological: Negative for dizziness, tremors, weakness, numbness and headaches.  Psychiatric/Behavioral: Negative for confusion and sleep disturbance.  Objective:  BP 112/78 (BP Location: Left Arm, Patient Position: Sitting, Cuff Size: Normal)   Pulse 61   Temp 98.1 F (36.7 C) (Oral)   Ht 5\' 4"  (1.626 m)   Wt 170 lb (77.1 kg)   SpO2 98%   BMI 29.18 kg/m   BP Readings from Last 3 Encounters:  07/11/17 112/78  07/09/17 110/78  07/02/17 136/76    Wt Readings from Last 3 Encounters:  07/11/17 170 lb (77.1 kg)  07/09/17 171 lb (77.6 kg)  07/02/17 168 lb 14 oz (76.6 kg)    Physical Exam  Constitutional: She  appears well-developed. No distress.  HENT:  Head: Normocephalic.  Right Ear: External ear normal.  Left Ear: External ear normal.  Nose: Nose normal.  Mouth/Throat: Oropharynx is clear and moist.  Eyes: Pupils are equal, round, and reactive to light. Conjunctivae are normal. Right eye exhibits no discharge. Left eye exhibits no discharge.  Neck: Normal range of motion. Neck supple. No JVD present. No tracheal deviation present. No thyromegaly present.  Cardiovascular: Normal rate, regular rhythm and normal heart sounds.   Pulmonary/Chest: No stridor. No respiratory distress. She has no wheezes.  Abdominal: Soft. Bowel sounds are normal. She exhibits no distension and no mass. There is no tenderness. There is no rebound and no guarding.  Musculoskeletal: She exhibits no edema or tenderness.  Lymphadenopathy:    She has no cervical adenopathy.  Neurological: She displays normal reflexes. No cranial nerve deficit. She exhibits normal muscle tone. Coordination normal.  Skin: No rash noted. No erythema.  Psychiatric: She has a normal mood and affect. Her behavior is normal. Judgment and thought content normal.    Lab Results  Component Value Date   WBC 6.0 07/09/2017   HGB 10.5 (L) 07/09/2017   HCT 31.4 (L) 07/09/2017   PLT 332 07/09/2017   GLUCOSE 91 07/09/2017   CHOL 171 11/26/2016   TRIG 99 11/26/2016   HDL 90 11/26/2016   LDLDIRECT 89.1 06/29/2013   LDLCALC 61 11/26/2016   ALT 9 09/13/2016   AST 18 09/13/2016   NA 135 07/09/2017   K 4.6 07/09/2017   CL 99 07/09/2017   CREATININE 1.21 (H) 07/09/2017   BUN 24 07/09/2017   CO2 21 07/09/2017   TSH 2.11 04/29/2015   INR 1.0 06/12/2017   HGBA1C 5.6 07/04/2014    No results found.  Assessment & Plan:   There are no diagnoses linked to this encounter. I am having Ms. Gerstel maintain her cetirizine, sodium chloride, metoprolol succinate, diphenoxylate-atropine, promethazine, clobetasol cream, LORazepam, diclofenac sodium,  linaclotide, ELIQUIS, DULoxetine, ketotifen, aspirin EC, clopidogrel, pantoprazole, atorvastatin, and nitroGLYCERIN.  No orders of the defined types were placed in this encounter.    Follow-up: No Follow-up on file.  Walker Kehr, MD

## 2017-07-11 NOTE — Assessment & Plan Note (Signed)
Coreg, Eliquis, ASA x1 mo, Plavix

## 2017-07-16 ENCOUNTER — Telehealth: Payer: Self-pay | Admitting: Interventional Cardiology

## 2017-07-16 NOTE — Telephone Encounter (Signed)
New message    Pt is calling with a question.   Pt c/o medication issue:  1. Name of Medication: eliquis, plavix, aspirin  2. How are you currently taking this medication (dosage and times per day)? Twice daily, daily, daily  3. Are you having a reaction (difficulty breathing--STAT)? Pt states that she is covered in bruises that are not going away.  4. What is your medication issue? Pt states she wants to know if she needs to continue on blood thinners.

## 2017-07-16 NOTE — Telephone Encounter (Signed)
Spoke with pt and she was concerned about bruising she is having.  Advised unfortunately this is a side effect of being on these three medications.  Per Lori's note, pt will be stopping ASA on 11/23.  Advised to continue with this plan and to see Dr. Tamala Julian 12/5 as planned.  Told pt to call sooner if needed.  Pt verbalized understanding and was appreciative for call.

## 2017-07-22 ENCOUNTER — Telehealth: Payer: Self-pay | Admitting: Interventional Cardiology

## 2017-07-22 NOTE — Telephone Encounter (Signed)
She should probably be seen. She is on triple drug therapy with Eliquis, aspirin, and Plavix.  A CBC should be done. We will need to be seen by an APP.  I am somewhat reluctant to make blood pressure medication adjustment based on her data.

## 2017-07-22 NOTE — Telephone Encounter (Signed)
Pt c/o BP issue: STAT if pt c/o blurred vision, one-sided weakness or slurred speech  1. What are your last 5 BP readings? This morning it was 154/110 and after breakfast it was 133/94  2. Are you having any other symptoms (ex. Dizziness, headache, blurred vision, passed out)? dizziness  3. What is your BP issue? BP has been unstable, goes up and down

## 2017-07-22 NOTE — Telephone Encounter (Signed)
Spoke with patient who is reporting she hasn't felt well all weekend-having a lot of fatigue, no energy at all.  She takes Metoprolol succinate 50 mg BID.  Her BP has been ranging from 82/59 to 189/141 per her report.  Last BP was this am around 10 - 133/94.  She had taken Metoprolol at 8:30 am.  Her HR has been ranging from 58 to 72 bpm.   She uses a wrist cuff.  Advised to continue to monitor BP/HR and take medications as listed unless very low and having symptoms.  Advised to make sure she has fresh batteries in her BP monitor.  Advised arm cuffs are generally more accurate than wrist cuffs.  Pt denies any medication and or diet changes, no increase in pain but does report an increase in stress level. "Family drama" Pt aware I will forward this information to Dr Tamala Julian for review and orders.  She is aware someone will c/b once we hear back from him.

## 2017-07-22 NOTE — Telephone Encounter (Signed)
appt scheduled for 11/13 at 2 pm.  She is aware Dr Tamala Julian feels she will need to be seen for further evaluation prior to any treatment changes.

## 2017-07-23 ENCOUNTER — Ambulatory Visit: Payer: Medicare Other | Admitting: Physician Assistant

## 2017-07-23 ENCOUNTER — Encounter: Payer: Self-pay | Admitting: Physician Assistant

## 2017-07-23 VITALS — BP 126/84 | HR 66 | Ht 64.0 in | Wt 171.2 lb

## 2017-07-23 DIAGNOSIS — R5383 Other fatigue: Secondary | ICD-10-CM

## 2017-07-23 DIAGNOSIS — Z955 Presence of coronary angioplasty implant and graft: Secondary | ICD-10-CM

## 2017-07-23 DIAGNOSIS — F329 Major depressive disorder, single episode, unspecified: Secondary | ICD-10-CM

## 2017-07-23 DIAGNOSIS — I10 Essential (primary) hypertension: Secondary | ICD-10-CM

## 2017-07-23 DIAGNOSIS — F32A Depression, unspecified: Secondary | ICD-10-CM

## 2017-07-23 DIAGNOSIS — D6489 Other specified anemias: Secondary | ICD-10-CM

## 2017-07-23 DIAGNOSIS — G903 Multi-system degeneration of the autonomic nervous system: Secondary | ICD-10-CM | POA: Diagnosis not present

## 2017-07-23 DIAGNOSIS — I25709 Atherosclerosis of coronary artery bypass graft(s), unspecified, with unspecified angina pectoris: Secondary | ICD-10-CM

## 2017-07-23 DIAGNOSIS — I480 Paroxysmal atrial fibrillation: Secondary | ICD-10-CM

## 2017-07-23 DIAGNOSIS — H353131 Nonexudative age-related macular degeneration, bilateral, early dry stage: Secondary | ICD-10-CM | POA: Diagnosis not present

## 2017-07-23 NOTE — Progress Notes (Signed)
Cardiology Office Note    Date:  07/23/2017   ID:  Hailey [name], DOB [birth year], MRN [id number]  PCP:  Cassandria Anger, MD  Cardiologist:  Dr. Tamala Julian  Chief Complaint: fluctuating BP  History of Present Illness:   Hailey [name] is a 82 y.o. female PSVT, paroxysmal atrial fibrillation on chronic anticoagulation, and hypertension presents for BP issue.   Recently noted extreme fatigue, lightheadedness and weakness. + palpitations. Lots of emotional stress noted with death of her son. Referred on initially for cardioversion - but in light of past abnormal Myoview and coronary calcification seen on prior imaging - cardiac cath was recommended - see below. Now s/p athrectomy of the RCA. Noted 3V CAD. She is on triple therapy anticoagulation for 1 months - then aspirin will be stopped and DAPT will be continued.   She was doing overall better when seen by APP 07/09/17. Noted multiple bruise.  Recently fluctuating BP and added to my schedule. Dr. Recommended CBC.  Friday he was beaten path fatigue and was noted hypotension at a blood pressure of 88/59.  Pulse was regular.  However lately feeling some fluttering sensation.  She is under a lot of emotional and financial stress since the death of her son and taking care of her granddaughter who recently diagnosed with breast cancer.  Her dyspnea on exertion improving seems PCI.  Denies orthopnea, PND, syncope, melena or blood in the stool or urine.  Easy bruising.     Past Medical History:  Diagnosis Date  . Allergic rhinitis   . Anxiety   . Arthritis    "my whole spine" (07/01/2017)  . Atrial fibrillation ([place])   . Coronary artery disease    10/18 PCI/DES to p/m LCx with cutting balloon to mLcx  . Diverticulosis of colon   . GERD (gastroesophageal reflux disease)   . Hip bursitis 2010   Dr Para March, Post op seroma  . History of colon polyps   . HTN (hypertension)   . IBS (irritable bowel syndrome)    constipation predominant  - Dr Earlean Shawl  . Lichen sclerosus   . Osteopenia 11/2016   T score -2.0 FRAX 15%/4.3%  . PAC (premature atrial contraction)    Symptomatiic  . Scoliosis   . SVT (supraventricular tachycardia) ([place])    brief history    Past Surgical History:  Procedure Laterality Date  . ANTERIOR AND POSTERIOR VAGINAL REPAIR  01/2002   Archie Endo 01/23/2011  . APPENDECTOMY  1948  . CARDIAC CATHETERIZATION  06/26/2017  . CORONARY ANGIOPLASTY WITH STENT PLACEMENT  07/01/2017  . HAMMER TOE SURGERY    . HEMORRHOID BANDING    . HIP SURGERY Left 04/2009   hip examination under anesthesia followed by greater trochanteric bursectomy; iliotibial band tenotomy/notes 01/20/2011  . KNEE BURSECTOMY Right 04/2009   Archie Endo 01/09/2011  . PUBOVAGINAL SLING  01/2002   Archie Endo 01/23/2011  . REDUCTION MAMMAPLASTY    . VAGINAL HYSTERECTOMY  01/2002   Vaginal hysterectomy, bilateral salpingo-oophorectomy/notes 01/23/2011    Current Medications: Prior to Admission medications   Medication Sig Start Date End Date Taking? Authorizing Provider  aspirin EC 81 MG tablet Take 81 mg by mouth daily.    [provider]  atorvastatin (LIPITOR) 80 MG tablet Take 1 tablet (80 mg total) by mouth daily at 6 PM. 07/02/17   Cheryln Manly, NP  cetirizine (ZYRTEC) 10 MG tablet Take 10 mg by mouth daily as needed for allergies or rhinitis.     [provider]  clobetasol cream (TEMOVATE) 0.05 % APPLY NIGHTLY AS DIRECTED. Patient taking differently: Apply 1 application topically daily as needed (irritation). APPLY NIGHTLY AS DIRECTED. 02/06/17   Fontaine, Belinda Block, MD  clopidogrel (PLAVIX) 75 MG tablet Take 1 tablet (75 mg total) by mouth daily. 06/27/17 06/27/18  Belva Crome, MD  diclofenac sodium (VOLTAREN) 1 % GEL APPLY 2 GRAMS TOPICALLY 4 TIMES DAILY. 07/11/17   Plotnikov, Evie Lacks, MD  diphenoxylate-atropine (LOMOTIL) 2.5-0.025 MG tablet Take 1 tablet by mouth 4 (four) times daily as needed for diarrhea or loose  stools. 09/13/16   Plotnikov, Evie Lacks, MD  DULoxetine (CYMBALTA) 60 MG capsule TAKE (1) CAPSULE DAILY. 07/11/17   Plotnikov, Evie Lacks, MD  ELIQUIS 5 MG TABS tablet TAKE 1 TABLET TWICE DAILY. Patient taking differently: TAKE 5 MG TABLET TWICE DAILY. 05/27/17   Belva Crome, MD  ketotifen (ZADITOR) 0.025 % ophthalmic solution Place 1 drop into both eyes daily as needed (ITCHING).    [provider]  linaclotide (LINZESS) 290 MCG CAPS capsule Take 1 capsule (290 mcg total) by mouth daily as needed. Patient taking differently: Take 290 mcg by mouth daily as needed (CONSTIPATION).  05/08/17   Plotnikov, Evie Lacks, MD  LORazepam (ATIVAN) 1 MG tablet TAKE (1) TABLET TWICE A DAY AS NEEDED. Patient taking differently: TAKE 1 MG TABLET TWICE A DAY AS NEEDED FOR SLEEP 02/21/17   Plotnikov, Evie Lacks, MD  metoprolol succinate (TOPROL-XL) 50 MG 24 hr tablet TAKE 1 TABLET TWICE DAILY. Patient taking differently: TAKE 50 MG TABLET BY MOUTH TWICE DAILY. 09/11/16   Belva Crome, MD  nitroGLYCERIN (NITROSTAT) 0.4 MG SL tablet Place 1 tablet (0.4 mg total) under the tongue every 5 (five) minutes as needed. 07/02/17   Cheryln Manly, NP  pantoprazole (PROTONIX) 40 MG tablet Take 1 tablet (40 mg total) by mouth daily. 07/02/17   Cheryln Manly, NP  promethazine (PHENERGAN) 25 MG tablet Take 1 tablet (25 mg total) by mouth every 8 (eight) hours as needed for nausea or vomiting. 09/13/16   Plotnikov, Evie Lacks, MD  sodium chloride (OCEAN) 0.65 % SOLN nasal spray Place 1 spray into both nostrils 2 (two) times daily as needed for congestion. 07/06/16   Nche, Charlene Brooke, NP    Allergies:   Macrobid [nitrofurantoin monohyd macro]; Meloxicam; Digoxin and related; and Sulfamethoxazole-trimethoprim   Social History   Socioeconomic History  . Marital status: Married    Spouse name: None  . Number of children: 2  . Years of education: None  . Highest education level: None  Social Needs  . Financial  resource strain: None  . Food insecurity - worry: None  . Food insecurity - inability: None  . Transportation needs - medical: None  . Transportation needs - non-medical: None  Occupational History  . Occupation: Patent attorney: RETIRED  Tobacco Use  . Smoking status: Former Smoker    Packs/day: 0.25    Years: 28.00    Pack years: 7.00    Types: Cigarettes    Last attempt to quit: 1981    Years since quitting: 37.8  . Smokeless tobacco: Never Used  Substance and Sexual Activity  . Alcohol use: Yes    Alcohol/week: 12.6 oz    Types: 7 Standard drinks or equivalent, 14 Shots of liquor per week    Comment: 07/01/2017 "couple shots/day"  . Drug use: No  . Sexual activity: Yes    Birth control/protection: Surgical  Comment: 1st intercourse 82 yo--Fewer than 5 partners  Other Topics Concern  . None  Social History Narrative   Regular Exercise -  YES           Family History:  The patient's family history includes Cancer in her brother, father, and son; Colon cancer in her mother; Diabetes in her father.   ROS:   Please see the history of present illness.    ROS All other systems reviewed and are negative.   PHYSICAL EXAM:   VS:  BP 126/84   Pulse 66   Ht 5\' 4"  (1.626 m)   Wt 171 lb 4 oz (77.7 kg)   SpO2 92%   BMI 29.39 kg/m    GEN: Well nourished, well developed, in no acute distress  HEENT: normal  Neck: no JVD, carotid bruits, or masses Cardiac: RRR; no murmurs, rubs, or gallops,no edema  Respiratory:  clear to auscultation bilaterally, normal work of breathing GI: soft, nontender, nondistended, + BS MS: no deformity or atrophy  Skin: warm and dry, no rash Neuro:  Alert and Oriented x 3, Strength and sensation are intact Psych: euthymic mood, full affect  Wt Readings from Last 3 Encounters:  07/23/17 171 lb 4 oz (77.7 kg)  07/11/17 170 lb (77.1 kg)  07/09/17 171 lb (77.6 kg)      Studies/Labs Reviewed:   EKG:  EKG is not  ordered today.     Recent Labs: 08/01/2016: Brain Natriuretic Peptide 185.4 09/13/2016: ALT 9 07/09/2017: BUN 24; Creatinine, Ser 1.21; Hemoglobin 10.5; Platelets 332; Potassium 4.6; Sodium 135   Lipid Panel    Component Value Date/Time   CHOL 171 11/26/2016 0844   TRIG 99 11/26/2016 0844   TRIG 183 (H) 07/26/2006 0903   HDL 90 11/26/2016 0844   CHOLHDL 1.9 11/26/2016 0844   CHOLHDL 2.2 07/04/2014 0528   VLDL 19 07/04/2014 0528   LDLCALC 61 11/26/2016 0844   LDLDIRECT 89.1 06/29/2013 1604    Additional studies/ records that were reviewed today include:   LEFT HEART CATH AND CORONARY ANGIOGRAPHY 06/2017  Conclusion    Severe diffuse three-vessel coronary calcification, particularly have in the right coronary artery.  85-90% proximal followed by generalized 50-60% mid RCA calcified stenosis.  Distal left main 25%. Heavily calcified.  Irregularities in the proximal and mid LAD up to 50%. Heavily calcified.  Circumflex artery is relatively small and is free of any significant obstruction.   Normal left ventricular systolic function with normal left ventricular end-diastolic pressure. EF 60%.  RECOMMENDATIONS:  Plavix is started today.  Resume Eliquis in a.m. Discontinue eloquent's after p.m. dose on Friday.  Orbital atherectomy will be planned for 07/01/2017 probably from right femoral approach with temporary pacemaker insertion to avoid bradycardia.    Atherectomy Conclusion     Dist RCA lesion, 50 %stenosed.   Successful PCI with Orbital atherectomy followed by stenting of a heavily calcified right coronary reducing a proximal to mid 85% stenosis to 0% with with TIMI grade 3 flow. An Onyx 4.0 x 26 mm DES was post.-dilated to 4.5 mm in diameter.  RECOMMENDATIONS:   Aspirin, Plavix, and Eliquis for 1 month then drop aspirin.  Plavix and Eliquis x 5 months then drop plavix.  Resume Eliquis in AM 07/02/2017.    Echocardiogram 09/16/15: Study Conclusions  - Left  ventricle: The cavity size was normal. Wall thickness was normal. Systolic function was normal. The estimated ejection fraction was in the range of 55% to 60%. Wall motion was  normal; there were no regional wall motion abnormalities. - Mitral valve: There was mild regurgitation. - Left atrium: The atrium was mildly dilated. - Right atrium: The atrium was mildly dilated. - Tricuspid valve: There was mild-moderate regurgitation directed centrally. - Pulmonary arteries: Systolic pressure was mildly increased. PA peak pressure: 38 mm Hg (S).  Nuclear Stress study 08/25/2016: Study Highlights   Nuclear stress EF: 66%.  This is a low risk study.  The left ventricular ejection fraction is normal (55-65%).  Defect 1: There is a medium defect of moderate severity present in the mid anteroseptal, mid inferoseptal, mid inferolateral, apical anterior, apical inferior, apical lateral and apex location.  This defect likely represents breast attenuation artifact. The defect is fixed and the apex of the LV contracts vigorously .   Coronary calcification noted on CT angiogram of the chest performed in 2014.   ASSESSMENT & PLAN:    1. CAS s/p PCI/athrectomy to the RCA 07/01/17 - Continue triple therapy. Stop ASA after one month of PCI.  Her dyspnea is improving after PCI.  Continue statin and  beta-blocker.  2. HTN -Blood pressure stable since Saturday 11/10.  She felt fatigued and hypotensive on Friday 11/9.  She has a prior history of intermittent hypotension due to dehydration.  She has lots of  stress.  Encourage hydration.  Keep log of blood pressure.  Can hold beta-blocker if blood pressure runs below 100/50.  She will give [place] a call if recurrent symptoms.  3. HLD - 11/26/2016: Cholesterol, Total 171; HDL 90; LDL Calculated 61; Triglycerides 99  - Continue statin   4.  Fatigue. -Improving.  Will check CBC given recent anemia on triple therapy.  Cannot rule out afib due to  palpitation/fluttering.  However improved in the past 2 days.  Will hold further evaluation for now as she is dealing with financial situation.  If no improvement will get a 30-day monitor and patient is agreeable with plan.   5. PAF - sinus on exam. Continue BB and Eliquis. As above  Medication Adjustments/Labs and Tests Ordered: Current medicines are reviewed at length with the patient today.  Concerns regarding medicines are outlined above.  Medication changes, Labs and Tests ordered today are listed in the Patient Instructions below. Patient Instructions  Medication Instructions:  Your physician recommends that you continue on your current medications as directed. Please refer to the Current Medication list given to you today.   Labwork: TODAY:  CBC  Testing/Procedures: None ordered  Follow-Up: Your physician recommends that you schedule a follow-up appointment in: WITH DR. Tamala Julian AS SCHEDULED   Any Other Special Instructions Will Be Listed Below (If Applicable).     If you need a refill on your cardiac medications before your next appointment, please call your pharmacy.      Jarrett Soho, [place]  07/23/2017 2:31 PM    [facility] [place], [place], [place]  [address] Phone: [phone]; Fax: [phone]

## 2017-07-23 NOTE — Patient Instructions (Addendum)
Medication Instructions:  Your physician recommends that you continue on your current medications as directed. Please refer to the Current Medication list given to you today.   Labwork: TODAY:  CBC  Testing/Procedures: None ordered  Follow-Up: Your physician recommends that you schedule a follow-up appointment in: WITH DR. Tamala Julian AS SCHEDULED   Any Other Special Instructions Will Be Listed Below (If Applicable).     If you need a refill on your cardiac medications before your next appointment, please call your pharmacy.

## 2017-07-24 DIAGNOSIS — M4726 Other spondylosis with radiculopathy, lumbar region: Secondary | ICD-10-CM | POA: Diagnosis not present

## 2017-07-24 DIAGNOSIS — M5136 Other intervertebral disc degeneration, lumbar region: Secondary | ICD-10-CM | POA: Diagnosis not present

## 2017-07-24 DIAGNOSIS — M5416 Radiculopathy, lumbar region: Secondary | ICD-10-CM | POA: Diagnosis not present

## 2017-07-24 DIAGNOSIS — M4155 Other secondary scoliosis, thoracolumbar region: Secondary | ICD-10-CM | POA: Diagnosis not present

## 2017-07-24 DIAGNOSIS — M5126 Other intervertebral disc displacement, lumbar region: Secondary | ICD-10-CM | POA: Diagnosis not present

## 2017-07-24 LAB — CBC
Hematocrit: 33.6 % — ABNORMAL LOW (ref 34.0–46.6)
Hemoglobin: 11.2 g/dL (ref 11.1–15.9)
MCH: 31.9 pg (ref 26.6–33.0)
MCHC: 33.3 g/dL (ref 31.5–35.7)
MCV: 96 fL (ref 79–97)
Platelets: 307 10*3/uL (ref 150–379)
RBC: 3.51 x10E6/uL — ABNORMAL LOW (ref 3.77–5.28)
RDW: 14.3 % (ref 12.3–15.4)
WBC: 8 10*3/uL (ref 3.4–10.8)

## 2017-07-26 ENCOUNTER — Telehealth (HOSPITAL_COMMUNITY): Payer: Self-pay | Admitting: Pharmacist

## 2017-07-30 ENCOUNTER — Encounter (HOSPITAL_COMMUNITY): Payer: Self-pay

## 2017-07-30 ENCOUNTER — Encounter (HOSPITAL_COMMUNITY)
Admission: RE | Admit: 2017-07-30 | Discharge: 2017-07-30 | Disposition: A | Discharge status: Home / Self Care | Payer: Medicare Other | Source: Ambulatory Visit | Attending: Interventional Cardiology | Admitting: Interventional Cardiology

## 2017-07-30 VITALS — BP 122/68 | HR 57 | Ht 63.5 in | Wt 172.6 lb

## 2017-07-30 DIAGNOSIS — M419 Scoliosis, unspecified: Secondary | ICD-10-CM | POA: Diagnosis not present

## 2017-07-30 DIAGNOSIS — Z7982 Long term (current) use of aspirin: Secondary | ICD-10-CM | POA: Insufficient documentation

## 2017-07-30 DIAGNOSIS — M1990 Unspecified osteoarthritis, unspecified site: Secondary | ICD-10-CM | POA: Diagnosis not present

## 2017-07-30 DIAGNOSIS — F419 Anxiety disorder, unspecified: Secondary | ICD-10-CM | POA: Insufficient documentation

## 2017-07-30 DIAGNOSIS — I10 Essential (primary) hypertension: Secondary | ICD-10-CM | POA: Diagnosis not present

## 2017-07-30 DIAGNOSIS — M8580 Other specified disorders of bone density and structure, unspecified site: Secondary | ICD-10-CM | POA: Insufficient documentation

## 2017-07-30 DIAGNOSIS — Z87891 Personal history of nicotine dependence: Secondary | ICD-10-CM | POA: Diagnosis not present

## 2017-07-30 DIAGNOSIS — Z955 Presence of coronary angioplasty implant and graft: Secondary | ICD-10-CM | POA: Insufficient documentation

## 2017-07-30 DIAGNOSIS — Z7901 Long term (current) use of anticoagulants: Secondary | ICD-10-CM | POA: Insufficient documentation

## 2017-07-30 DIAGNOSIS — Z86010 Personal history of colonic polyps: Secondary | ICD-10-CM | POA: Insufficient documentation

## 2017-07-30 DIAGNOSIS — Z7902 Long term (current) use of antithrombotics/antiplatelets: Secondary | ICD-10-CM | POA: Diagnosis not present

## 2017-07-30 DIAGNOSIS — K219 Gastro-esophageal reflux disease without esophagitis: Secondary | ICD-10-CM | POA: Insufficient documentation

## 2017-07-30 DIAGNOSIS — K589 Irritable bowel syndrome without diarrhea: Secondary | ICD-10-CM | POA: Diagnosis not present

## 2017-07-30 DIAGNOSIS — Z79899 Other long term (current) drug therapy: Secondary | ICD-10-CM | POA: Insufficient documentation

## 2017-07-30 DIAGNOSIS — I2510 Atherosclerotic heart disease of native coronary artery without angina pectoris: Secondary | ICD-10-CM | POA: Insufficient documentation

## 2017-07-30 DIAGNOSIS — I4891 Unspecified atrial fibrillation: Secondary | ICD-10-CM | POA: Insufficient documentation

## 2017-07-30 NOTE — Progress Notes (Signed)
Hailey [name] 82 y.o. female DOB: [birth year] MRN: [id number]      Nutrition Note  Dx: CAS s/p PCI/athrectomy to the RCA 07/01/17 Past Medical History:  Diagnosis Date  . Allergic rhinitis   . Anxiety   . Arthritis    "my whole spine" (07/01/2017)  . Atrial fibrillation ([place])   . Coronary artery disease    10/18 PCI/DES to p/m LCx with cutting balloon to mLcx  . Diverticulosis of colon   . GERD (gastroesophageal reflux disease)   . Hip bursitis 2010   Dr Para March, Post op seroma  . History of colon polyps   . HTN (hypertension)   . IBS (irritable bowel syndrome)    constipation predominant - Dr Earlean Shawl  . Lichen sclerosus   . Osteopenia 11/2016   T score -2.0 FRAX 15%/4.3%  . PAC (premature atrial contraction)    Symptomatiic  . Scoliosis   . SVT (supraventricular tachycardia) ([place])    brief history   Meds reviewed.   HT: Ht Readings from Last 1 Encounters:  07/23/17 5\' 4"  (1.626 m)    WT: Wt Readings from Last 3 Encounters:  07/23/17 171 lb 4 oz (77.7 kg)  07/11/17 170 lb (77.1 kg)  07/09/17 171 lb (77.6 kg)     BMI 29.4   Current tobacco use? No  Labs:  Lipid Panel     Component Value Date/Time   CHOL 171 11/26/2016 0844   TRIG 99 11/26/2016 0844   TRIG 183 (H) 07/26/2006 0903   HDL 90 11/26/2016 0844   CHOLHDL 1.9 11/26/2016 0844   CHOLHDL 2.2 07/04/2014 0528   VLDL 19 07/04/2014 0528   LDLCALC 61 11/26/2016 0844   LDLDIRECT 89.1 06/29/2013 1604    Lab Results  Component Value Date   HGBA1C 5.6 07/04/2014   CBG (last 3)  No results for input(s): GLUCAP in the last 72 hours.  Nutrition Note Spoke with pt. Nutrition plan and goals reviewed with pt. Pt is following Step 1 of the Therapeutic Lifestyle Changes diet. Age-appropriate nutrition recommendations discussed. Pt wants to lose wt. Pt has not been actively trying to lose wt and hopes exercise will help her lose wt "but I don't want to give up any of my treats." Pt expressed understanding of the  information reviewed. Pt aware of nutrition education classes offered.  Nutrition Diagnosis ? Food-and nutrition-related knowledge deficit related to lack of exposure to information as related to diagnosis of: ? CVD ? Overweight related to excessive energy intake as evidenced by a BMI of 29.4  Nutrition Intervention ? Pt's individual nutrition plan and goals reviewed with pt. ? Pt given handouts for: ? Nutrition I class ? Nutrition II class   Nutrition Goal(s):  ? Pt to identify food quantities necessary to achieve weight loss of 6-24 lb at graduation from cardiac rehab.   Plan:  Pt to attend nutrition classes ? Nutrition I ? Nutrition II ? Portion Distortion  Will provide client-centered nutrition education as part of interdisciplinary care.   Monitor and evaluate progress toward nutrition goal with team.  Derek Mound, M.Ed, RD, LDN, CDE 07/30/2017 2:38 PM

## 2017-07-30 NOTE — Progress Notes (Signed)
Cardiac Individual Treatment Plan  Patient Details  Name: Hailey [name] MRN: [id number] Date of Birth: April 05, 1935 Referring Provider:     CARDIAC REHAB PHASE II ORIENTATION from 07/30/2017 in [place]  Referring Provider  Daneen Schick MD      Initial Encounter Date:    CARDIAC REHAB PHASE II ORIENTATION from 07/30/2017 in [place] [facility]  Date  07/30/17  Referring Provider  Daneen Schick MD      Visit Diagnosis: Stented coronary artery 07/01/17 DES RCA  Patient's Home Medications on Admission:  Current Outpatient Medications:  .  aspirin EC 81 MG tablet, Take 81 mg by mouth daily., Disp: , Rfl:  .  atorvastatin (LIPITOR) 80 MG tablet, Take 1 tablet (80 mg total) by mouth daily at 6 PM., Disp: 90 tablet, Rfl: 1 .  cetirizine (ZYRTEC) 10 MG tablet, Take 10 mg by mouth daily as needed for allergies or rhinitis. , Disp: , Rfl:  .  clobetasol cream (TEMOVATE) 0.05 %, APPLY NIGHTLY AS DIRECTED. (Patient taking differently: Apply 1 application topically daily as needed (irritation). APPLY NIGHTLY AS DIRECTED.), Disp: 30 g, Rfl: 2 .  clopidogrel (PLAVIX) 75 MG tablet, Take 1 tablet (75 mg total) by mouth daily., Disp: 30 tablet, Rfl: 11 .  diclofenac sodium (VOLTAREN) 1 % GEL, APPLY 2 GRAMS TOPICALLY 4 TIMES DAILY., Disp: 100 g, Rfl: 3 .  diphenoxylate-atropine (LOMOTIL) 2.5-0.025 MG tablet, Take 1 tablet by mouth 4 (four) times daily as needed for diarrhea or loose stools., Disp: 60 tablet, Rfl: 1 .  DULoxetine (CYMBALTA) 60 MG capsule, TAKE (1) CAPSULE DAILY., Disp: 30 capsule, Rfl: 5 .  ELIQUIS 5 MG TABS tablet, TAKE 1 TABLET TWICE DAILY. (Patient taking differently: TAKE 5 MG TABLET TWICE DAILY.), Disp: 180 tablet, Rfl: 2 .  ketotifen (ZADITOR) 0.025 % ophthalmic solution, Place 1 drop into both eyes daily as needed (ITCHING)., Disp: , Rfl:  .  linaclotide (LINZESS) 290 MCG CAPS capsule, Take 1 capsule (290 mcg total) by mouth  daily as needed. (Patient taking differently: Take 290 mcg by mouth daily as needed (CONSTIPATION). ), Disp: 30 capsule, Rfl: 5 .  LORazepam (ATIVAN) 1 MG tablet, TAKE (1) TABLET TWICE A DAY AS NEEDED. (Patient taking differently: TAKE 1 MG TABLET TWICE A DAY AS NEEDED FOR SLEEP), Disp: 180 tablet, Rfl: 0 .  metoprolol succinate (TOPROL-XL) 50 MG 24 hr tablet, TAKE 1 TABLET TWICE DAILY. (Patient taking differently: TAKE 50 MG TABLET BY MOUTH TWICE DAILY.), Disp: 180 tablet, Rfl: 3 .  nitroGLYCERIN (NITROSTAT) 0.4 MG SL tablet, Place 1 tablet (0.4 mg total) under the tongue every 5 (five) minutes as needed., Disp: 25 tablet, Rfl: 2 .  pantoprazole (PROTONIX) 40 MG tablet, Take 1 tablet (40 mg total) by mouth daily., Disp: 30 tablet, Rfl: 4 .  promethazine (PHENERGAN) 25 MG tablet, Take 1 tablet (25 mg total) by mouth every 8 (eight) hours as needed for nausea or vomiting., Disp: 30 tablet, Rfl: 0 .  sodium chloride (OCEAN) 0.65 % SOLN nasal spray, Place 1 spray into both nostrils 2 (two) times daily as needed for congestion., Disp: 15 mL, Rfl: 0  Past Medical History: Past Medical History:  Diagnosis Date  . Allergic rhinitis   . Anxiety   . Arthritis    "my whole spine" (07/01/2017)  . Atrial fibrillation ([place])   . Coronary artery disease    10/18 PCI/DES to p/m LCx with cutting balloon to mLcx  .  Diverticulosis of colon   . GERD (gastroesophageal reflux disease)   . Hip bursitis 2010   Dr Para March, Post op seroma  . History of colon polyps   . HTN (hypertension)   . IBS (irritable bowel syndrome)    constipation predominant - Dr Earlean Shawl  . Lichen sclerosus   . Osteopenia 11/2016   T score -2.0 FRAX 15%/4.3%  . PAC (premature atrial contraction)    Symptomatiic  . Scoliosis   . SVT (supraventricular tachycardia) ([place])    brief history    Tobacco Use: Social History   Tobacco Use  Smoking Status Former Smoker  . Packs/day: 0.25  . Years: 28.00  . Pack years: 7.00  . Types:  Cigarettes  . Last attempt to quit: 1981  . Years since quitting: 37.9  Smokeless Tobacco Never Used    Labs: Recent Chemical engineer    Labs for ITP Cardiac and Pulmonary Rehab Latest Ref Rng & Units 10/31/2011 06/29/2013 09/01/2013 07/04/2014 11/26/2016   Cholestrol 100 - 199 mg/dL 173 208(H) - 153 171   LDLCALC 0 - 99 mg/dL 60 - - 65 61   LDLDIRECT mg/dL - 89.1 - - -   HDL >39 mg/dL 89.70 97.00 - 69 90   Trlycerides 0 - 149 mg/dL 118.0 138.0 - 97 99   Hemoglobin A1c <5.7 % - - - 5.6 -   TCO2 0 - 100 mmol/L - - 25 - -      Capillary Blood Glucose: Lab Results  Component Value Date   GLUCAP 87 09/01/2013     Exercise Target Goals: Date: 07/30/17  Exercise Program Goal: Individual exercise prescription set with THRR, safety & activity barriers. Participant demonstrates ability to understand and report RPE using BORG scale, to self-measure pulse accurately, and to acknowledge the importance of the exercise prescription.  Exercise Prescription Goal: Starting with aerobic activity 30 plus minutes a day, 3 days per week for initial exercise prescription. Provide home exercise prescription and guidelines that participant acknowledges understanding prior to discharge.  Activity Barriers & Risk Stratification: Activity Barriers & Cardiac Risk Stratification - 07/30/17 1548      Activity Barriers & Cardiac Risk Stratification   Activity Barriers  Arthritis;Back Problems;Other (comment)    Comments  arthritis in hip     Cardiac Risk Stratification  High       6 Minute Walk: 6 Minute Walk    Row Name 07/30/17 1540         6 Minute Walk   Phase  Initial     Distance  1328 feet     Walk Time  6 minutes     # of Rest Breaks  0     MPH  2.5     METS  2.06     RPE  12     VO2 Peak  7.22     Symptoms  Yes (comment)     Comments  mild SOB and brealthlessness at end of walk test     Resting HR  57 bpm     Resting BP  122/68     Resting Oxygen Saturation   96 %      Exercise Oxygen Saturation  during 6 min walk  97 %     Max Ex. HR  89 bpm     Max Ex. BP  126/84     2 Minute Post BP  104/78        Oxygen Initial Assessment:   Oxygen Re-Evaluation:  Oxygen Discharge (Final Oxygen Re-Evaluation):   Initial Exercise Prescription: Initial Exercise Prescription - 07/30/17 1500      Date of Initial Exercise RX and Referring Provider   Date  07/30/17    Referring Provider  Daneen Schick MD      Treadmill   MPH  2.2    Grade  0    Minutes  10    METs  2.68      Recumbant Bike   Level  1.5    Watts  5    Minutes  10    METs  2.2      NuStep   Level  2    SPM  80    Minutes  10    METs  2      Prescription Details   Frequency (times per week)  3    Duration  Progress to 30 minutes of continuous aerobic without signs/symptoms of physical distress      Intensity   THRR 40-80% of Max Heartrate  56-112    Ratings of Perceived Exertion  11-13    Perceived Dyspnea  0-4      Progression   Progression  Continue to progress workloads to maintain intensity without signs/symptoms of physical distress.      Resistance Training   Training Prescription  Yes    Weight  2lbs    Reps  10-15       Perform Capillary Blood Glucose checks as needed.  Exercise Prescription Changes:   Exercise Comments:   Exercise Goals and Review:  Exercise Goals    Row Name 07/30/17 1550             Exercise Goals   Increase Physical Activity  Yes       Intervention  Provide advice, education, support and counseling about physical activity/exercise needs.;Develop an individualized exercise prescription for aerobic and resistive training based on initial evaluation findings, risk stratification, comorbidities and participant's personal goals.       Expected Outcomes  Achievement of increased cardiorespiratory fitness and enhanced flexibility, muscular endurance and strength shown through measurements of functional capacity and personal statement  of participant.       Increase Strength and Stamina  Yes       Intervention  Provide advice, education, support and counseling about physical activity/exercise needs.;Develop an individualized exercise prescription for aerobic and resistive training based on initial evaluation findings, risk stratification, comorbidities and participant's personal goals.       Expected Outcomes  Achievement of increased cardiorespiratory fitness and enhanced flexibility, muscular endurance and strength shown through measurements of functional capacity and personal statement of participant.       Able to understand and use rate of perceived exertion (RPE) scale  Yes       Intervention  Provide education and explanation on how to use RPE scale       Expected Outcomes  Short Term: Able to use RPE daily in rehab to express subjective intensity level;Long Term:  Able to use RPE to guide intensity level when exercising independently       Knowledge and understanding of Target Heart Rate Range (THRR)  Yes       Intervention  Provide education and explanation of THRR including how the numbers were predicted and where they are located for reference       Expected Outcomes  Short Term: Able to state/look up THRR;Long Term: Able to use THRR to govern intensity when exercising independently;Short Term: Able  to use daily as guideline for intensity in rehab       Able to check pulse independently  Yes       Intervention  Provide education and demonstration on how to check pulse in carotid and radial arteries.;Review the importance of being able to check your own pulse for safety during independent exercise       Expected Outcomes  Short Term: Able to explain why pulse checking is important during independent exercise;Long Term: Able to check pulse independently and accurately       Understanding of Exercise Prescription  Yes       Intervention  Provide education, explanation, and written materials on patient's individual exercise  prescription       Expected Outcomes  Short Term: Able to explain program exercise prescription;Long Term: Able to explain home exercise prescription to exercise independently          Exercise Goals Re-Evaluation :    Discharge Exercise Prescription (Final Exercise Prescription Changes):   Nutrition:  Target Goals: Understanding of nutrition guidelines, daily intake of sodium 1500mg , cholesterol 200mg , calories 30% from fat and 7% or less from saturated fats, daily to have 5 or more servings of fruits and vegetables.  Biometrics: Pre Biometrics - 07/30/17 1652      Pre Biometrics   Height  5' 3.5" (1.613 m)    Weight  172 lb 9.9 oz (78.3 kg)    Waist Circumference  29.5 inches    Hip Circumference  44.5 inches    Waist to Hip Ratio  0.66 %    BMI (Calculated)  30.09    Triceps Skinfold  26 mm    % Body Fat  38.7 %    Grip Strength  25 kg    Flexibility  8.5 in    Single Leg Stand  6 seconds      Post Biometrics - 07/30/17 1527       Post  Biometrics   Height  5' 3.5" (1.613 m)    Weight  172 lb 9.9 oz (78.3 kg)    Waist Circumference  29.5 inches    Hip Circumference  44.5 inches    Waist to Hip Ratio  0.66 %    BMI (Calculated)  30.09    Triceps Skinfold  26 mm    % Body Fat  38.7 %    Grip Strength  25 kg    Flexibility  8.5 in    Single Leg Stand  6 seconds       Nutrition Therapy Plan and Nutrition Goals: Nutrition Therapy & Goals - 07/30/17 1447      Nutrition Therapy   Diet  Heart Healthy      Personal Nutrition Goals   Nutrition Goal  Pt to identify food quantities necessary to achieve weight loss of 6-24 lb at graduation from cardiac rehab.       Intervention Plan   Intervention  Prescribe, educate and counsel regarding individualized specific dietary modifications aiming towards targeted core components such as weight, hypertension, lipid management, diabetes, heart failure and other comorbidities.    Expected Outcomes  Short Term Goal:  Understand basic principles of dietary content, such as calories, fat, sodium, cholesterol and nutrients.;Long Term Goal: Adherence to prescribed nutrition plan.       Nutrition Discharge: Nutrition Scores: Nutrition Assessments - 07/30/17 1447      MEDFICTS Scores   Pre Score  55       Nutrition Goals Re-Evaluation:  Nutrition Goals Re-Evaluation:   Nutrition Goals Discharge (Final Nutrition Goals Re-Evaluation):   Psychosocial: Target Goals: Acknowledge presence or absence of significant depression and/or stress, maximize coping skills, provide positive support system. Participant is able to verbalize types and ability to use techniques and skills needed for reducing stress and depression.  Initial Review & Psychosocial Screening: Initial Psych Review & Screening - 07/30/17 1432      Initial Review   Current issues with  None Identified      Family Dynamics   Good Support System?  Yes spouse     Comments  no psychosocial needs identified, no interventions necessary       Barriers   Psychosocial barriers to participate in program  There are no identifiable barriers or psychosocial needs.      Screening Interventions   Interventions  Encouraged to exercise;Provide feedback about the scores to participant       Quality of Life Scores: Quality of Life - 07/30/17 1443      Quality of Life Scores   Health/Function Pre  24.13 %    Socioeconomic Pre  26.63 %    Psych/Spiritual Pre  20.21 %    Family Pre  25.2 %    GLOBAL Pre  24.07 %       PHQ-9: Recent Review Flowsheet Data    Depression screen [facility] 2/9 07/11/2017 05/29/2016 12/08/2015 12/08/2015 10/10/2015   Decreased Interest 0 0 0 0 0   Down, Depressed, Hopeless 1 0 0 0 0   PHQ - 2 Score 1 0 0 0 0     Interpretation of Total Score  Total Score Depression Severity:  1-4 = Minimal depression, 5-9 = Mild depression, 10-14 = Moderate depression, 15-19 = Moderately severe depression, 20-27 = Severe depression    Psychosocial Evaluation and Intervention:   Psychosocial Re-Evaluation:   Psychosocial Discharge (Final Psychosocial Re-Evaluation):   Vocational Rehabilitation: Provide vocational rehab assistance to qualifying candidates.   Vocational Rehab Evaluation & Intervention: Vocational Rehab - 07/30/17 1432      Initial Vocational Rehab Evaluation & Intervention   Assessment shows need for Vocational Rehabilitation  No retired        Education: Education Goals: Education classes will be provided on a weekly basis, covering required topics. Participant will state understanding/return demonstration of topics presented.  Learning Barriers/Preferences: Learning Barriers/Preferences - 07/30/17 1443      Learning Barriers/Preferences   Learning Barriers  Sight    Learning Preferences  Verbal Instruction;Written Material       Education Topics: Count Your Pulse:  -Group instruction provided by verbal instruction, demonstration, patient participation and written materials to support subject.  Instructors address importance of being able to find your pulse and how to count your pulse when at home without a heart monitor.  Patients get hands on experience counting their pulse with staff help and individually.   Heart Attack, Angina, and Risk Factor Modification:  -Group instruction provided by verbal instruction, video, and written materials to support subject.  Instructors address signs and symptoms of angina and heart attacks.    Also discuss risk factors for heart disease and how to make changes to improve heart health risk factors.   Functional Fitness:  -Group instruction provided by verbal instruction, demonstration, patient participation, and written materials to support subject.  Instructors address safety measures for doing things around the house.  Discuss how to get up and down off the floor, how to pick things up properly, how to safely get  out of a chair without assistance,  and balance training.   Meditation and Mindfulness:  -Group instruction provided by verbal instruction, patient participation, and written materials to support subject.  Instructor addresses importance of mindfulness and meditation practice to help reduce stress and improve awareness.  Instructor also leads participants through a meditation exercise.    Stretching for Flexibility and Mobility:  -Group instruction provided by verbal instruction, patient participation, and written materials to support subject.  Instructors lead participants through series of stretches that are designed to increase flexibility thus improving mobility.  These stretches are additional exercise for major muscle groups that are typically performed during regular warm up and cool down.   Hands Only CPR:  -Group verbal, video, and participation provides a basic overview of AHA guidelines for community CPR. Role-play of emergencies allow participants the opportunity to practice calling for help and chest compression technique with discussion of AED use.   Hypertension: -Group verbal and written instruction that provides a basic overview of hypertension including the most recent diagnostic guidelines, risk factor reduction with self-care instructions and medication management.    Nutrition I class: Heart Healthy Eating:  -Group instruction provided by PowerPoint slides, verbal discussion, and written materials to support subject matter. The instructor gives an explanation and review of the Therapeutic Lifestyle Changes diet recommendations, which includes a discussion on lipid goals, dietary fat, sodium, fiber, plant stanol/sterol esters, sugar, and the components of a well-balanced, healthy diet.   Nutrition II class: Lifestyle Skills:  -Group instruction provided by PowerPoint slides, verbal discussion, and written materials to support subject matter. The instructor gives an explanation and review of label reading,  grocery shopping for heart health, heart healthy recipe modifications, and ways to make healthier choices when eating out.   Diabetes Question & Answer:  -Group instruction provided by PowerPoint slides, verbal discussion, and written materials to support subject matter. The instructor gives an explanation and review of diabetes co-morbidities, pre- and post-prandial blood glucose goals, pre-exercise blood glucose goals, signs, symptoms, and treatment of hypoglycemia and hyperglycemia, and foot care basics.   Diabetes Blitz:  -Group instruction provided by PowerPoint slides, verbal discussion, and written materials to support subject matter. The instructor gives an explanation and review of the physiology behind type 1 and type 2 diabetes, diabetes medications and rational behind using different medications, pre- and post-prandial blood glucose recommendations and Hemoglobin A1c goals, diabetes diet, and exercise including blood glucose guidelines for exercising safely.    Portion Distortion:  -Group instruction provided by PowerPoint slides, verbal discussion, written materials, and food models to support subject matter. The instructor gives an explanation of serving size versus portion size, changes in portions sizes over the last 20 years, and what consists of a serving from each food group.   Stress Management:  -Group instruction provided by verbal instruction, video, and written materials to support subject matter.  Instructors review role of stress in heart disease and how to cope with stress positively.     Exercising on Your Own:  -Group instruction provided by verbal instruction, power point, and written materials to support subject.  Instructors discuss benefits of exercise, components of exercise, frequency and intensity of exercise, and end points for exercise.  Also discuss use of nitroglycerin and activating EMS.  Review options of places to exercise outside of rehab.  Review  guidelines for sex with heart disease.   Cardiac Drugs I:  -Group instruction provided by verbal instruction and written materials to support subject.  Instructor  reviews cardiac drug classes: antiplatelets, anticoagulants, beta blockers, and statins.  Instructor discusses reasons, side effects, and lifestyle considerations for each drug class.   Cardiac Drugs II:  -Group instruction provided by verbal instruction and written materials to support subject.  Instructor reviews cardiac drug classes: angiotensin converting enzyme inhibitors (ACE-I), angiotensin II receptor blockers (ARBs), nitrates, and calcium channel blockers.  Instructor discusses reasons, side effects, and lifestyle considerations for each drug class.   Anatomy and Physiology of the Circulatory System:  Group verbal and written instruction and models provide basic cardiac anatomy and physiology, with the coronary electrical and arterial systems. Review of: AMI, Angina, Valve disease, Heart Failure, Peripheral Artery Disease, Cardiac Arrhythmia, Pacemakers, and the ICD.   Other Education:  -Group or individual verbal, written, or video instructions that support the educational goals of the cardiac rehab program.   Knowledge Questionnaire Score: Knowledge Questionnaire Score - 07/30/17 1442      Knowledge Questionnaire Score   Pre Score  19/24       Core Components/Risk Factors/Patient Goals at Admission: Personal Goals and Risk Factors at Admission - 07/30/17 1525      Core Components/Risk Factors/Patient Goals on Admission    Weight Management  Yes;Obesity;Weight Maintenance;Weight Loss    Intervention  Weight Management: Develop a combined nutrition and exercise program designed to reach desired caloric intake, while maintaining appropriate intake of nutrient and fiber, sodium and fats, and appropriate energy expenditure required for the weight goal.;Weight Management: Provide education and appropriate resources to  help participant work on and attain dietary goals.;Weight Management/Obesity: Establish reasonable short term and long term weight goals.;Obesity: Provide education and appropriate resources to help participant work on and attain dietary goals.    Admit Weight  172 lb 9.9 oz (78.3 kg)    Goal Weight: Short Term  168 lb (76.2 kg)    Goal Weight: Long Term  162 lb (73.5 kg)    Expected Outcomes  Short Term: Continue to assess and modify interventions until short term weight is achieved;Long Term: Adherence to nutrition and physical activity/exercise program aimed toward attainment of established weight goal;Weight Maintenance: Understanding of the daily nutrition guidelines, which includes 25-35% calories from fat, 7% or less cal from saturated fats, less than 200mg  cholesterol, less than 1.5gm of sodium, & 5 or more servings of fruits and vegetables daily;Weight Loss: Understanding of general recommendations for a balanced deficit meal plan, which promotes 1-2 lb weight loss per week and includes a negative energy balance of [phone] kcal/d;Understanding recommendations for meals to include 15-35% energy as protein, 25-35% energy from fat, 35-60% energy from carbohydrates, less than 200mg  of dietary cholesterol, 20-35 gm of total fiber daily;Understanding of distribution of calorie intake throughout the day with the consumption of 4-5 meals/snacks    Hypertension  Yes    Intervention  Provide education on lifestyle modifcations including regular physical activity/exercise, weight management, moderate sodium restriction and increased consumption of fresh fruit, vegetables, and low fat dairy, alcohol moderation, and smoking cessation.;Monitor prescription use compliance.    Expected Outcomes  Short Term: Continued assessment and intervention until BP is < 140/74mm HG in hypertensive participants. < 130/72mm HG in hypertensive participants with diabetes, heart failure or chronic kidney disease.;Long Term:  Maintenance of blood pressure at goal levels.    Stress  Yes    Intervention  Offer individual and/or small group education and counseling on adjustment to heart disease, stress management and health-related lifestyle change. Teach and support self-help strategies.;Refer participants experiencing significant psychosocial  distress to appropriate mental health specialists for further evaluation and treatment. When possible, include family members and significant others in education/counseling sessions.    Expected Outcomes  Short Term: Participant demonstrates changes in health-related behavior, relaxation and other stress management skills, ability to obtain effective social support, and compliance with psychotropic medications if prescribed.;Long Term: Emotional wellbeing is indicated by absence of clinically significant psychosocial distress or social isolation.       Core Components/Risk Factors/Patient Goals Review:    Core Components/Risk Factors/Patient Goals at Discharge (Final Review):    ITP Comments: ITP Comments    Row Name 07/30/17 1431           ITP Comments  Dr. Fransico Him, Medical Director          Comments: Hailey [name] attended orientation from 1347 to 1506 to review rules and guidelines for program. Completed 6 minute walk test, Intitial ITP, and exercise prescription.  VSS. Telemetry-Sinus Rhythm with a first degree heart block this has been previously documented. Hailey [name] did complain of feeling a little breathless. This resolved after rest.Hailey Venetia Maxon, RN,BSN 07/30/2017 5:06 PM

## 2017-07-30 NOTE — Progress Notes (Signed)
Cardiac Rehab Medication Review by a Pharmacist  Does the patient  feel that his/her medications are working for him/her?  yes  Has the patient been experiencing any side effects to the medications prescribed?  yes  Does the patient measure his/her own blood pressure or blood glucose at home?  no   Does the patient have any problems obtaining medications due to transportation or finances?   no  Understanding of regimen: excellent Understanding of indications: excellent Potential of compliance: excellent    Pharmacist comments: pt verbalizes concern about the increased amount of bruising from triple antiplatelet therapy. Pt is scheduled to discontinue ASA 81mg  08/02/2017 as directed by MD.  Pt has scheduled cardiology office f/u appointment 08/14/2017.  Pt instructed to discuss concerns about bruising to physician at that time. Will contact MD sooner if bleeding occurs or increased bruising concerns verbalized by patient. Pt verbalized understanding.      Nancie Neas Ainara Eldridge 07/30/2017 2:46 PM

## 2017-08-05 ENCOUNTER — Encounter (HOSPITAL_COMMUNITY)
Admission: RE | Admit: 2017-08-05 | Discharge: 2017-08-05 | Disposition: A | Discharge status: Home / Self Care | Payer: Medicare Other | Source: Ambulatory Visit | Attending: Interventional Cardiology | Admitting: Interventional Cardiology

## 2017-08-05 DIAGNOSIS — Z7982 Long term (current) use of aspirin: Secondary | ICD-10-CM | POA: Diagnosis not present

## 2017-08-05 DIAGNOSIS — Z7902 Long term (current) use of antithrombotics/antiplatelets: Secondary | ICD-10-CM | POA: Diagnosis not present

## 2017-08-05 DIAGNOSIS — Z86010 Personal history of colonic polyps: Secondary | ICD-10-CM | POA: Diagnosis not present

## 2017-08-05 DIAGNOSIS — M419 Scoliosis, unspecified: Secondary | ICD-10-CM | POA: Diagnosis not present

## 2017-08-05 DIAGNOSIS — K589 Irritable bowel syndrome without diarrhea: Secondary | ICD-10-CM | POA: Diagnosis not present

## 2017-08-05 DIAGNOSIS — M1990 Unspecified osteoarthritis, unspecified site: Secondary | ICD-10-CM | POA: Diagnosis not present

## 2017-08-05 DIAGNOSIS — M8580 Other specified disorders of bone density and structure, unspecified site: Secondary | ICD-10-CM | POA: Diagnosis not present

## 2017-08-05 DIAGNOSIS — Z955 Presence of coronary angioplasty implant and graft: Secondary | ICD-10-CM

## 2017-08-05 DIAGNOSIS — Z79899 Other long term (current) drug therapy: Secondary | ICD-10-CM | POA: Diagnosis not present

## 2017-08-05 DIAGNOSIS — I10 Essential (primary) hypertension: Secondary | ICD-10-CM | POA: Diagnosis not present

## 2017-08-05 DIAGNOSIS — I4891 Unspecified atrial fibrillation: Secondary | ICD-10-CM | POA: Diagnosis not present

## 2017-08-05 DIAGNOSIS — Z87891 Personal history of nicotine dependence: Secondary | ICD-10-CM | POA: Diagnosis not present

## 2017-08-05 DIAGNOSIS — I2510 Atherosclerotic heart disease of native coronary artery without angina pectoris: Secondary | ICD-10-CM | POA: Diagnosis not present

## 2017-08-05 DIAGNOSIS — Z7901 Long term (current) use of anticoagulants: Secondary | ICD-10-CM | POA: Diagnosis not present

## 2017-08-05 DIAGNOSIS — K219 Gastro-esophageal reflux disease without esophagitis: Secondary | ICD-10-CM | POA: Diagnosis not present

## 2017-08-05 NOTE — Progress Notes (Signed)
Daily Session Note  Patient Details  Name: LISA MILIAN MRN: [id number] Date of Birth: [birth year] Referring Provider:     CARDIAC REHAB PHASE II ORIENTATION from 07/30/2017 in [place]  Referring Provider  Daneen Schick MD      Encounter Date: 08/05/2017  Check In: Session Check In - 08/05/17 1143      Check-In   Location  [facility]    Staff Present  Luetta Nutting Fair, MS, ACSM RCEP, Exercise Physiologist;Joann Rion, RN, Market researcher, RN, [facility], MS, ACSM RCEP, Exercise Physiologist;Tyara [employer], MS,ACSM CEP, Exercise Physiologist    Supervising physician immediately available to respond to emergencies  [facility] immediately available    Physician(s)  Dr. Maylene Roes     Medication changes reported      No    Fall or balance concerns reported     No    Tobacco Cessation  No Change    Warm-up and Cool-down  Performed as group-led instruction    Resistance Training Performed  Yes    VAD Patient?  No      Pain Assessment   Currently in Pain?  No/denies       Capillary Blood Glucose: No results found for this or any previous visit (from the past 24 hour(s)).    Social History   Tobacco Use  Smoking Status Former Smoker  . Packs/day: 0.25  . Years: 28.00  . Pack years: 7.00  . Types: Cigarettes  . Last attempt to quit: 1981  . Years since quitting: 37.9  Smokeless Tobacco Never Used    Goals Met:  Exercise tolerated well  Goals Unmet:  Not Applicable  Comments: Corky started cardiac rehab today.  Pt tolerated light exercise without difficulty. VSS, telemetry-Sinus Rhythm with a first degree heart block , asymptomatic.  Medication list reconciled. Pt denies barriers to medicaiton compliance.  PSYCHOSOCIAL ASSESSMENT:  PHQ-0. Pt exhibits positive coping skills, hopeful outlook with supportive family. No psychosocial needs identified at this time, no psychosocial interventions necessary.    Pt enjoys  reading, exercising and watching TV .   Pt oriented to exercise equipment and routine.    Understanding verbalized.Barnet Pall, RN,BSN 08/05/2017 5:10 PM   Dr. Fransico Him is Medical Director for Cardiac Rehab at [facility].

## 2017-08-07 ENCOUNTER — Encounter (HOSPITAL_COMMUNITY)
Admission: RE | Admit: 2017-08-07 | Discharge: 2017-08-07 | Disposition: A | Discharge status: Home / Self Care | Payer: Medicare Other | Source: Ambulatory Visit | Attending: Interventional Cardiology | Admitting: Interventional Cardiology

## 2017-08-07 DIAGNOSIS — K589 Irritable bowel syndrome without diarrhea: Secondary | ICD-10-CM | POA: Diagnosis not present

## 2017-08-07 DIAGNOSIS — I10 Essential (primary) hypertension: Secondary | ICD-10-CM | POA: Diagnosis not present

## 2017-08-07 DIAGNOSIS — Z7902 Long term (current) use of antithrombotics/antiplatelets: Secondary | ICD-10-CM | POA: Diagnosis not present

## 2017-08-07 DIAGNOSIS — I4891 Unspecified atrial fibrillation: Secondary | ICD-10-CM | POA: Diagnosis not present

## 2017-08-07 DIAGNOSIS — Z79899 Other long term (current) drug therapy: Secondary | ICD-10-CM | POA: Diagnosis not present

## 2017-08-07 DIAGNOSIS — Z7982 Long term (current) use of aspirin: Secondary | ICD-10-CM | POA: Diagnosis not present

## 2017-08-07 DIAGNOSIS — Z86010 Personal history of colonic polyps: Secondary | ICD-10-CM | POA: Diagnosis not present

## 2017-08-07 DIAGNOSIS — Z955 Presence of coronary angioplasty implant and graft: Secondary | ICD-10-CM | POA: Diagnosis not present

## 2017-08-07 DIAGNOSIS — Z87891 Personal history of nicotine dependence: Secondary | ICD-10-CM | POA: Diagnosis not present

## 2017-08-07 DIAGNOSIS — M419 Scoliosis, unspecified: Secondary | ICD-10-CM | POA: Diagnosis not present

## 2017-08-07 DIAGNOSIS — Z7901 Long term (current) use of anticoagulants: Secondary | ICD-10-CM | POA: Diagnosis not present

## 2017-08-07 DIAGNOSIS — M1990 Unspecified osteoarthritis, unspecified site: Secondary | ICD-10-CM | POA: Diagnosis not present

## 2017-08-07 DIAGNOSIS — I2510 Atherosclerotic heart disease of native coronary artery without angina pectoris: Secondary | ICD-10-CM | POA: Diagnosis not present

## 2017-08-07 DIAGNOSIS — M8580 Other specified disorders of bone density and structure, unspecified site: Secondary | ICD-10-CM | POA: Diagnosis not present

## 2017-08-07 DIAGNOSIS — K219 Gastro-esophageal reflux disease without esophagitis: Secondary | ICD-10-CM | POA: Diagnosis not present

## 2017-08-08 ENCOUNTER — Other Ambulatory Visit: Payer: Self-pay | Admitting: Internal Medicine

## 2017-08-09 ENCOUNTER — Encounter (HOSPITAL_COMMUNITY)
Admission: RE | Admit: 2017-08-09 | Discharge: 2017-08-09 | Disposition: A | Discharge status: Home / Self Care | Payer: Medicare Other | Source: Ambulatory Visit | Attending: Interventional Cardiology | Admitting: Interventional Cardiology

## 2017-08-09 DIAGNOSIS — Z87891 Personal history of nicotine dependence: Secondary | ICD-10-CM | POA: Diagnosis not present

## 2017-08-09 DIAGNOSIS — Z7902 Long term (current) use of antithrombotics/antiplatelets: Secondary | ICD-10-CM | POA: Diagnosis not present

## 2017-08-09 DIAGNOSIS — M1990 Unspecified osteoarthritis, unspecified site: Secondary | ICD-10-CM | POA: Diagnosis not present

## 2017-08-09 DIAGNOSIS — K589 Irritable bowel syndrome without diarrhea: Secondary | ICD-10-CM | POA: Diagnosis not present

## 2017-08-09 DIAGNOSIS — I2510 Atherosclerotic heart disease of native coronary artery without angina pectoris: Secondary | ICD-10-CM | POA: Diagnosis not present

## 2017-08-09 DIAGNOSIS — Z7901 Long term (current) use of anticoagulants: Secondary | ICD-10-CM | POA: Diagnosis not present

## 2017-08-09 DIAGNOSIS — Z955 Presence of coronary angioplasty implant and graft: Secondary | ICD-10-CM | POA: Diagnosis not present

## 2017-08-09 DIAGNOSIS — K219 Gastro-esophageal reflux disease without esophagitis: Secondary | ICD-10-CM | POA: Diagnosis not present

## 2017-08-09 DIAGNOSIS — M419 Scoliosis, unspecified: Secondary | ICD-10-CM | POA: Diagnosis not present

## 2017-08-09 DIAGNOSIS — M8580 Other specified disorders of bone density and structure, unspecified site: Secondary | ICD-10-CM | POA: Diagnosis not present

## 2017-08-09 DIAGNOSIS — I10 Essential (primary) hypertension: Secondary | ICD-10-CM | POA: Diagnosis not present

## 2017-08-09 DIAGNOSIS — Z7982 Long term (current) use of aspirin: Secondary | ICD-10-CM | POA: Diagnosis not present

## 2017-08-09 DIAGNOSIS — Z86010 Personal history of colonic polyps: Secondary | ICD-10-CM | POA: Diagnosis not present

## 2017-08-09 DIAGNOSIS — Z79899 Other long term (current) drug therapy: Secondary | ICD-10-CM | POA: Diagnosis not present

## 2017-08-09 DIAGNOSIS — I4891 Unspecified atrial fibrillation: Secondary | ICD-10-CM | POA: Diagnosis not present

## 2017-08-09 NOTE — Progress Notes (Signed)
Cardiac Individual Treatment Plan  Patient Details  Name: Hailey [name] MRN: [id number] Date of Birth: [birth year] Referring Provider:     CARDIAC REHAB PHASE II ORIENTATION from 07/30/2017 in [place]  Referring Provider  Daneen Schick MD      Initial Encounter Date:    CARDIAC REHAB PHASE II ORIENTATION from 07/30/2017 in [place]  Date  07/30/17  Referring Provider  Daneen Schick MD      Visit Diagnosis: Stented coronary artery 07/01/17 DES RCA  Patient's Home Medications on Admission:  Current Outpatient Medications:  .  atorvastatin (LIPITOR) 80 MG tablet, Take 1 tablet (80 mg total) by mouth daily at 6 PM., Disp: 90 tablet, Rfl: 1 .  cetirizine (ZYRTEC) 10 MG tablet, Take 10 mg by mouth daily as needed for allergies or rhinitis. , Disp: , Rfl:  .  clobetasol cream (TEMOVATE) 0.05 %, APPLY NIGHTLY AS DIRECTED. (Patient taking differently: Apply 1 application topically daily as needed (irritation). APPLY NIGHTLY AS DIRECTED.), Disp: 30 g, Rfl: 2 .  clopidogrel (PLAVIX) 75 MG tablet, Take 1 tablet (75 mg total) by mouth daily., Disp: 30 tablet, Rfl: 11 .  diclofenac sodium (VOLTAREN) 1 % GEL, APPLY 2 GRAMS TOPICALLY 4 TIMES DAILY., Disp: 100 g, Rfl: 3 .  diphenoxylate-atropine (LOMOTIL) 2.5-0.025 MG tablet, Take 1 tablet by mouth 4 (four) times daily as needed for diarrhea or loose stools., Disp: 60 tablet, Rfl: 1 .  DULoxetine (CYMBALTA) 60 MG capsule, TAKE (1) CAPSULE DAILY., Disp: 30 capsule, Rfl: 5 .  ELIQUIS 5 MG TABS tablet, TAKE 1 TABLET TWICE DAILY. (Patient taking differently: TAKE 5 MG TABLET TWICE DAILY.), Disp: 180 tablet, Rfl: 2 .  ketotifen (ZADITOR) 0.025 % ophthalmic solution, Place 1 drop into both eyes daily as needed (ITCHING)., Disp: , Rfl:  .  linaclotide (LINZESS) 290 MCG CAPS capsule, Take 1 capsule (290 mcg total) by mouth daily as needed. (Patient taking differently: Take 290 mcg by mouth  daily as needed (CONSTIPATION). ), Disp: 30 capsule, Rfl: 5 .  LORazepam (ATIVAN) 1 MG tablet, TAKE (1) TABLET TWICE A DAY AS NEEDED. (Patient taking differently: TAKE 1 MG TABLET TWICE A DAY AS NEEDED FOR SLEEP), Disp: 180 tablet, Rfl: 0 .  metoprolol succinate (TOPROL-XL) 50 MG 24 hr tablet, TAKE 1 TABLET TWICE DAILY. (Patient taking differently: TAKE 50 MG TABLET BY MOUTH TWICE DAILY.), Disp: 180 tablet, Rfl: 3 .  nitroGLYCERIN (NITROSTAT) 0.4 MG SL tablet, Place 1 tablet (0.4 mg total) under the tongue every 5 (five) minutes as needed., Disp: 25 tablet, Rfl: 2 .  pantoprazole (PROTONIX) 40 MG tablet, Take 1 tablet (40 mg total) by mouth daily., Disp: 30 tablet, Rfl: 4 .  promethazine (PHENERGAN) 25 MG tablet, Take 1 tablet (25 mg total) by mouth every 8 (eight) hours as needed for nausea or vomiting., Disp: 30 tablet, Rfl: 0 .  sodium chloride (OCEAN) 0.65 % SOLN nasal spray, Place 1 spray into both nostrils 2 (two) times daily as needed for congestion., Disp: 15 mL, Rfl: 0  Past Medical History: Past Medical History:  Diagnosis Date  . Allergic rhinitis   . Anxiety   . Arthritis    "my whole spine" (07/01/2017)  . Atrial fibrillation ([place])   . Coronary artery disease    10/18 PCI/DES to p/m LCx with cutting balloon to mLcx  . Diverticulosis of colon   . GERD (gastroesophageal reflux disease)   . Hip bursitis 2010  Dr Para March, Post op seroma  . History of colon polyps   . HTN (hypertension)   . IBS (irritable bowel syndrome)    constipation predominant - Dr Earlean Shawl  . Lichen sclerosus   . Osteopenia 11/2016   T score -2.0 FRAX 15%/4.3%  . PAC (premature atrial contraction)    Symptomatiic  . Scoliosis   . SVT (supraventricular tachycardia) ([place])    brief history    Tobacco Use: Social History   Tobacco Use  Smoking Status Former Smoker  . Packs/day: 0.25  . Years: 28.00  . Pack years: 7.00  . Types: Cigarettes  . Last attempt to quit: 1981  . Years since quitting:  37.9  Smokeless Tobacco Never Used    Labs: Recent Chemical engineer    Labs for ITP Cardiac and Pulmonary Rehab Latest Ref Rng & Units 10/31/2011 06/29/2013 09/01/2013 07/04/2014 11/26/2016   Cholestrol 100 - 199 mg/dL 173 208(H) - 153 171   LDLCALC 0 - 99 mg/dL 60 - - 65 61   LDLDIRECT mg/dL - 89.1 - - -   HDL >39 mg/dL 89.70 97.00 - 69 90   Trlycerides 0 - 149 mg/dL 118.0 138.0 - 97 99   Hemoglobin A1c <5.7 % - - - 5.6 -   TCO2 0 - 100 mmol/L - - 25 - -      Capillary Blood Glucose: Lab Results  Component Value Date   GLUCAP 87 09/01/2013     Exercise Target Goals:    Exercise Program Goal: Individual exercise prescription set with THRR, safety & activity barriers. Participant demonstrates ability to understand and report RPE using BORG scale, to self-measure pulse accurately, and to acknowledge the importance of the exercise prescription.  Exercise Prescription Goal: Starting with aerobic activity 30 plus minutes a day, 3 days per week for initial exercise prescription. Provide home exercise prescription and guidelines that participant acknowledges understanding prior to discharge.  Activity Barriers & Risk Stratification: Activity Barriers & Cardiac Risk Stratification - 07/30/17 1548      Activity Barriers & Cardiac Risk Stratification   Activity Barriers  Arthritis;Back Problems;Other (comment)    Comments  arthritis in hip     Cardiac Risk Stratification  High       6 Minute Walk: 6 Minute Walk    Row Name 07/30/17 1540         6 Minute Walk   Phase  Initial     Distance  1328 feet     Walk Time  6 minutes     # of Rest Breaks  0     MPH  2.5     METS  2.06     RPE  12     VO2 Peak  7.22     Symptoms  Yes (comment)     Comments  mild SOB and brealthlessness at end of walk test     Resting HR  57 bpm     Resting BP  122/68     Resting Oxygen Saturation   96 %     Exercise Oxygen Saturation  during 6 min walk  97 %     Max Ex. HR  89 bpm      Max Ex. BP  126/84     2 Minute Post BP  104/78        Oxygen Initial Assessment:   Oxygen Re-Evaluation:   Oxygen Discharge (Final Oxygen Re-Evaluation):   Initial Exercise Prescription: Initial Exercise Prescription - 07/30/17 1500  Date of Initial Exercise RX and Referring Provider   Date  07/30/17    Referring Provider  Daneen Schick MD      Treadmill   MPH  2.2    Grade  0    Minutes  10    METs  2.68      Recumbant Bike   Level  1.5    Watts  5    Minutes  10    METs  2.2      NuStep   Level  2    SPM  80    Minutes  10    METs  2      Prescription Details   Frequency (times per week)  3    Duration  Progress to 30 minutes of continuous aerobic without signs/symptoms of physical distress      Intensity   THRR 40-80% of Max Heartrate  56-112    Ratings of Perceived Exertion  11-13    Perceived Dyspnea  0-4      Progression   Progression  Continue to progress workloads to maintain intensity without signs/symptoms of physical distress.      Resistance Training   Training Prescription  Yes    Weight  2lbs    Reps  10-15       Perform Capillary Blood Glucose checks as needed.  Exercise Prescription Changes: Exercise Prescription Changes    Row Name 08/05/17 1627             Response to Exercise   Blood Pressure (Admit)  117/87       Blood Pressure (Exercise)  114/78       Blood Pressure (Exit)  108/60       Heart Rate (Admit)  62 bpm       Heart Rate (Exercise)  100 bpm       Heart Rate (Exit)  58 bpm       Rating of Perceived Exertion (Exercise)  13       Symptoms  none       Comments  pt oriented to exercise equipment today        Duration  Continue with 30 min of aerobic exercise without signs/symptoms of physical distress.       Intensity  THRR unchanged         Progression   Progression  Continue to progress workloads to maintain intensity without signs/symptoms of physical distress.       Average METs  2.3          Resistance Training   Training Prescription  Yes       Weight  2lbs       Reps  10-15       Time  10 Minutes         Treadmill   MPH  2.2       Grade  1       Minutes  10       METs  2.68         Recumbant Bike   Level  1.5       Watts  5       Minutes  10       METs  2         NuStep   Level  2       SPM  80       Minutes  10       METs  2  Exercise Comments: Exercise Comments    Row Name 08/06/17 1629           Exercise Comments  Pt was oriented to exercise equipment on 08/05/17. Pt responded well to exercise program; will continue to monitor pt's progress and activity level.          Exercise Goals and Review: Exercise Goals    Row Name 07/30/17 1550             Exercise Goals   Increase Physical Activity  Yes       Intervention  Provide advice, education, support and counseling about physical activity/exercise needs.;Develop an individualized exercise prescription for aerobic and resistive training based on initial evaluation findings, risk stratification, comorbidities and participant's personal goals.       Expected Outcomes  Achievement of increased cardiorespiratory fitness and enhanced flexibility, muscular endurance and strength shown through measurements of functional capacity and personal statement of participant.       Increase Strength and Stamina  Yes       Intervention  Provide advice, education, support and counseling about physical activity/exercise needs.;Develop an individualized exercise prescription for aerobic and resistive training based on initial evaluation findings, risk stratification, comorbidities and participant's personal goals.       Expected Outcomes  Achievement of increased cardiorespiratory fitness and enhanced flexibility, muscular endurance and strength shown through measurements of functional capacity and personal statement of participant.       Able to understand and use rate of perceived exertion (RPE) scale  Yes        Intervention  Provide education and explanation on how to use RPE scale       Expected Outcomes  Short Term: Able to use RPE daily in rehab to express subjective intensity level;Long Term:  Able to use RPE to guide intensity level when exercising independently       Knowledge and understanding of Target Heart Rate Range (THRR)  Yes       Intervention  Provide education and explanation of THRR including how the numbers were predicted and where they are located for reference       Expected Outcomes  Short Term: Able to state/look up THRR;Long Term: Able to use THRR to govern intensity when exercising independently;Short Term: Able to use daily as guideline for intensity in rehab       Able to check pulse independently  Yes       Intervention  Provide education and demonstration on how to check pulse in carotid and radial arteries.;Review the importance of being able to check your own pulse for safety during independent exercise       Expected Outcomes  Short Term: Able to explain why pulse checking is important during independent exercise;Long Term: Able to check pulse independently and accurately       Understanding of Exercise Prescription  Yes       Intervention  Provide education, explanation, and written materials on patient's individual exercise prescription       Expected Outcomes  Short Term: Able to explain program exercise prescription;Long Term: Able to explain home exercise prescription to exercise independently          Exercise Goals Re-Evaluation : Exercise Goals Re-Evaluation    Row Name 08/06/17 1631             Exercise Goal Re-Evaluation   Exercise Goals Review  Able to understand and use rate of perceived exertion (RPE) scale       Comments  Pt demonstrated proper use of RPE scale and did well with exercise session today.       Expected Outcomes  Pt will improve in cardiorespiratory fitness and improve in strength and endurance.            Discharge Exercise  Prescription (Final Exercise Prescription Changes): Exercise Prescription Changes - 08/05/17 1627      Response to Exercise   Blood Pressure (Admit)  117/87    Blood Pressure (Exercise)  114/78    Blood Pressure (Exit)  108/60    Heart Rate (Admit)  62 bpm    Heart Rate (Exercise)  100 bpm    Heart Rate (Exit)  58 bpm    Rating of Perceived Exertion (Exercise)  13    Symptoms  none    Comments  pt oriented to exercise equipment today     Duration  Continue with 30 min of aerobic exercise without signs/symptoms of physical distress.    Intensity  THRR unchanged      Progression   Progression  Continue to progress workloads to maintain intensity without signs/symptoms of physical distress.    Average METs  2.3      Resistance Training   Training Prescription  Yes    Weight  2lbs    Reps  10-15    Time  10 Minutes      Treadmill   MPH  2.2    Grade  1    Minutes  10    METs  2.68      Recumbant Bike   Level  1.5    Watts  5    Minutes  10    METs  2      NuStep   Level  2    SPM  80    Minutes  10    METs  2       Nutrition:  Target Goals: Understanding of nutrition guidelines, daily intake of sodium 1500mg , cholesterol 200mg , calories 30% from fat and 7% or less from saturated fats, daily to have 5 or more servings of fruits and vegetables.  Biometrics: Pre Biometrics - 07/30/17 1652      Pre Biometrics   Height  5' 3.5" (1.613 m)    Weight  172 lb 9.9 oz (78.3 kg)    Waist Circumference  29.5 inches    Hip Circumference  44.5 inches    Waist to Hip Ratio  0.66 %    BMI (Calculated)  30.09    Triceps Skinfold  26 mm    % Body Fat  38.7 %    Grip Strength  25 kg    Flexibility  8.5 in    Single Leg Stand  6 seconds      Post Biometrics - 07/30/17 1527       Post  Biometrics   Height  5' 3.5" (1.613 m)    Weight  172 lb 9.9 oz (78.3 kg)    Waist Circumference  29.5 inches    Hip Circumference  44.5 inches    Waist to Hip Ratio  0.66 %    BMI  (Calculated)  30.09    Triceps Skinfold  26 mm    % Body Fat  38.7 %    Grip Strength  25 kg    Flexibility  8.5 in    Single Leg Stand  6 seconds       Nutrition Therapy Plan and Nutrition Goals: Nutrition Therapy & Goals - 07/30/17  1447      Nutrition Therapy   Diet  Heart Healthy      Personal Nutrition Goals   Nutrition Goal  Pt to identify food quantities necessary to achieve weight loss of 6-24 lb at graduation from cardiac rehab.       Intervention Plan   Intervention  Prescribe, educate and counsel regarding individualized specific dietary modifications aiming towards targeted core components such as weight, hypertension, lipid management, diabetes, heart failure and other comorbidities.    Expected Outcomes  Short Term Goal: Understand basic principles of dietary content, such as calories, fat, sodium, cholesterol and nutrients.;Long Term Goal: Adherence to prescribed nutrition plan.       Nutrition Discharge: Nutrition Scores: Nutrition Assessments - 07/30/17 1447      MEDFICTS Scores   Pre Score  55       Nutrition Goals Re-Evaluation:   Nutrition Goals Re-Evaluation:   Nutrition Goals Discharge (Final Nutrition Goals Re-Evaluation):   Psychosocial: Target Goals: Acknowledge presence or absence of significant depression and/or stress, maximize coping skills, provide positive support system. Participant is able to verbalize types and ability to use techniques and skills needed for reducing stress and depression.  Initial Review & Psychosocial Screening: Initial Psych Review & Screening - 07/30/17 1432      Initial Review   Current issues with  None Identified      Family Dynamics   Good Support System?  Yes spouse     Comments  no psychosocial needs identified, no interventions necessary       Barriers   Psychosocial barriers to participate in program  There are no identifiable barriers or psychosocial needs.      Screening Interventions    Interventions  Encouraged to exercise;Provide feedback about the scores to participant       Quality of Life Scores: Quality of Life - 07/30/17 1443      Quality of Life Scores   Health/Function Pre  24.13 %    Socioeconomic Pre  26.63 %    Psych/Spiritual Pre  20.21 %    Family Pre  25.2 %    GLOBAL Pre  24.07 %       PHQ-9: Recent Review Flowsheet Data    Depression screen [facility] 2/9 08/05/2017 07/11/2017 05/29/2016 12/08/2015 12/08/2015   Decreased Interest 0 0 0 0 0   Down, Depressed, Hopeless 0 1 0 0 0   PHQ - 2 Score 0 1 0 0 0     Interpretation of Total Score  Total Score Depression Severity:  1-4 = Minimal depression, 5-9 = Mild depression, 10-14 = Moderate depression, 15-19 = Moderately severe depression, 20-27 = Severe depression   Psychosocial Evaluation and Intervention:   Psychosocial Re-Evaluation:   Psychosocial Discharge (Final Psychosocial Re-Evaluation):   Vocational Rehabilitation: Provide vocational rehab assistance to qualifying candidates.   Vocational Rehab Evaluation & Intervention: Vocational Rehab - 07/30/17 1432      Initial Vocational Rehab Evaluation & Intervention   Assessment shows need for Vocational Rehabilitation  No retired        Education: Education Goals: Education classes will be provided on a weekly basis, covering required topics. Participant will state understanding/return demonstration of topics presented.  Learning Barriers/Preferences: Learning Barriers/Preferences - 07/30/17 1443      Learning Barriers/Preferences   Learning Barriers  Sight    Learning Preferences  Verbal Instruction;Written Material       Education Topics: Count Your Pulse:  -Group instruction provided by verbal instruction, demonstration, patient  participation and written materials to support subject.  Instructors address importance of being able to find your pulse and how to count your pulse when at home without a heart monitor.  Patients get  hands on experience counting their pulse with staff help and individually.   Heart Attack, Angina, and Risk Factor Modification:  -Group instruction provided by verbal instruction, video, and written materials to support subject.  Instructors address signs and symptoms of angina and heart attacks.    Also discuss risk factors for heart disease and how to make changes to improve heart health risk factors.   Functional Fitness:  -Group instruction provided by verbal instruction, demonstration, patient participation, and written materials to support subject.  Instructors address safety measures for doing things around the house.  Discuss how to get up and down off the floor, how to pick things up properly, how to safely get out of a chair without assistance, and balance training.   Meditation and Mindfulness:  -Group instruction provided by verbal instruction, patient participation, and written materials to support subject.  Instructor addresses importance of mindfulness and meditation practice to help reduce stress and improve awareness.  Instructor also leads participants through a meditation exercise.    Stretching for Flexibility and Mobility:  -Group instruction provided by verbal instruction, patient participation, and written materials to support subject.  Instructors lead participants through series of stretches that are designed to increase flexibility thus improving mobility.  These stretches are additional exercise for major muscle groups that are typically performed during regular warm up and cool down.   CARDIAC REHAB PHASE II EXERCISE from 08/09/2017 in [place]  Date  08/09/17  Instruction Review Code  2- meets goals/outcomes      Hands Only CPR:  -Group verbal, video, and participation provides a basic overview of AHA guidelines for community CPR. Role-play of emergencies allow participants the opportunity to practice calling for help and chest  compression technique with discussion of AED use.   Hypertension: -Group verbal and written instruction that provides a basic overview of hypertension including the most recent diagnostic guidelines, risk factor reduction with self-care instructions and medication management.    Nutrition I class: Heart Healthy Eating:  -Group instruction provided by PowerPoint slides, verbal discussion, and written materials to support subject matter. The instructor gives an explanation and review of the Therapeutic Lifestyle Changes diet recommendations, which includes a discussion on lipid goals, dietary fat, sodium, fiber, plant stanol/sterol esters, sugar, and the components of a well-balanced, healthy diet.   Nutrition II class: Lifestyle Skills:  -Group instruction provided by PowerPoint slides, verbal discussion, and written materials to support subject matter. The instructor gives an explanation and review of label reading, grocery shopping for heart health, heart healthy recipe modifications, and ways to make healthier choices when eating out.   Diabetes Question & Answer:  -Group instruction provided by PowerPoint slides, verbal discussion, and written materials to support subject matter. The instructor gives an explanation and review of diabetes co-morbidities, pre- and post-prandial blood glucose goals, pre-exercise blood glucose goals, signs, symptoms, and treatment of hypoglycemia and hyperglycemia, and foot care basics.   Diabetes Blitz:  -Group instruction provided by PowerPoint slides, verbal discussion, and written materials to support subject matter. The instructor gives an explanation and review of the physiology behind type 1 and type 2 diabetes, diabetes medications and rational behind using different medications, pre- and post-prandial blood glucose recommendations and Hemoglobin A1c goals, diabetes diet, and exercise including blood  glucose guidelines for exercising safely.    Portion  Distortion:  -Group instruction provided by PowerPoint slides, verbal discussion, written materials, and food models to support subject matter. The instructor gives an explanation of serving size versus portion size, changes in portions sizes over the last 20 years, and what consists of a serving from each food group.   Stress Management:  -Group instruction provided by verbal instruction, video, and written materials to support subject matter.  Instructors review role of stress in heart disease and how to cope with stress positively.     Exercising on Your Own:  -Group instruction provided by verbal instruction, power point, and written materials to support subject.  Instructors discuss benefits of exercise, components of exercise, frequency and intensity of exercise, and end points for exercise.  Also discuss use of nitroglycerin and activating EMS.  Review options of places to exercise outside of rehab.  Review guidelines for sex with heart disease.   Cardiac Drugs I:  -Group instruction provided by verbal instruction and written materials to support subject.  Instructor reviews cardiac drug classes: antiplatelets, anticoagulants, beta blockers, and statins.  Instructor discusses reasons, side effects, and lifestyle considerations for each drug class.   Cardiac Drugs II:  -Group instruction provided by verbal instruction and written materials to support subject.  Instructor reviews cardiac drug classes: angiotensin converting enzyme inhibitors (ACE-I), angiotensin II receptor blockers (ARBs), nitrates, and calcium channel blockers.  Instructor discusses reasons, side effects, and lifestyle considerations for each drug class.   Anatomy and Physiology of the Circulatory System:  Group verbal and written instruction and models provide basic cardiac anatomy and physiology, with the coronary electrical and arterial systems. Review of: AMI, Angina, Valve disease, Heart Failure, Peripheral Artery  Disease, Cardiac Arrhythmia, Pacemakers, and the ICD.   CARDIAC REHAB PHASE II EXERCISE from 08/09/2017 in [place]  Date  08/07/17  Instruction Review Code  2- meets goals/outcomes      Other Education:  -Group or individual verbal, written, or video instructions that support the educational goals of the cardiac rehab program.   Knowledge Questionnaire Score: Knowledge Questionnaire Score - 07/30/17 1442      Knowledge Questionnaire Score   Pre Score  19/24       Core Components/Risk Factors/Patient Goals at Admission: Personal Goals and Risk Factors at Admission - 07/30/17 1525      Core Components/Risk Factors/Patient Goals on Admission    Weight Management  Yes;Obesity;Weight Maintenance;Weight Loss    Intervention  Weight Management: Develop a combined nutrition and exercise program designed to reach desired caloric intake, while maintaining appropriate intake of nutrient and fiber, sodium and fats, and appropriate energy expenditure required for the weight goal.;Weight Management: Provide education and appropriate resources to help participant work on and attain dietary goals.;Weight Management/Obesity: Establish reasonable short term and long term weight goals.;Obesity: Provide education and appropriate resources to help participant work on and attain dietary goals.    Admit Weight  172 lb 9.9 oz (78.3 kg)    Goal Weight: Short Term  168 lb (76.2 kg)    Goal Weight: Long Term  162 lb (73.5 kg)    Expected Outcomes  Short Term: Continue to assess and modify interventions until short term weight is achieved;Long Term: Adherence to nutrition and physical activity/exercise program aimed toward attainment of established weight goal;Weight Maintenance: Understanding of the daily nutrition guidelines, which includes 25-35% calories from fat, 7% or less cal from saturated fats, less than 200mg   cholesterol, less than 1.5gm of sodium, & 5 or more servings of  fruits and vegetables daily;Weight Loss: Understanding of general recommendations for a balanced deficit meal plan, which promotes 1-2 lb weight loss per week and includes a negative energy balance of [phone] kcal/d;Understanding recommendations for meals to include 15-35% energy as protein, 25-35% energy from fat, 35-60% energy from carbohydrates, less than 200mg  of dietary cholesterol, 20-35 gm of total fiber daily;Understanding of distribution of calorie intake throughout the day with the consumption of 4-5 meals/snacks    Hypertension  Yes    Intervention  Provide education on lifestyle modifcations including regular physical activity/exercise, weight management, moderate sodium restriction and increased consumption of fresh fruit, vegetables, and low fat dairy, alcohol moderation, and smoking cessation.;Monitor prescription use compliance.    Expected Outcomes  Short Term: Continued assessment and intervention until BP is < 140/16mm HG in hypertensive participants. < 130/66mm HG in hypertensive participants with diabetes, heart failure or chronic kidney disease.;Long Term: Maintenance of blood pressure at goal levels.    Stress  Yes    Intervention  Offer individual and/or small group education and counseling on adjustment to heart disease, stress management and health-related lifestyle change. Teach and support self-help strategies.;Refer participants experiencing significant psychosocial distress to appropriate mental health specialists for further evaluation and treatment. When possible, include family members and significant others in education/counseling sessions.    Expected Outcomes  Short Term: Participant demonstrates changes in health-related behavior, relaxation and other stress management skills, ability to obtain effective social support, and compliance with psychotropic medications if prescribed.;Long Term: Emotional wellbeing is indicated by absence of clinically significant psychosocial  distress or social isolation.       Core Components/Risk Factors/Patient Goals Review:    Core Components/Risk Factors/Patient Goals at Discharge (Final Review):    ITP Comments: ITP Comments    Row Name 07/30/17 1431 08/09/17 1419         ITP Comments  Dr. Fransico Him, Medical Director  30 Day ITP Review, Patient with good partcipation and good attendance in cardiac rehab         Comments: See ITP comments. Corky is off to a good start.Barnet Pall, RN,BSN 08/09/2017 2:47 PM

## 2017-08-12 ENCOUNTER — Encounter (HOSPITAL_COMMUNITY)
Admission: RE | Admit: 2017-08-12 | Discharge: 2017-08-12 | Disposition: A | Discharge status: Home / Self Care | Payer: Medicare Other | Source: Ambulatory Visit | Attending: Interventional Cardiology | Admitting: Interventional Cardiology

## 2017-08-12 DIAGNOSIS — Z7902 Long term (current) use of antithrombotics/antiplatelets: Secondary | ICD-10-CM | POA: Diagnosis not present

## 2017-08-12 DIAGNOSIS — Z79899 Other long term (current) drug therapy: Secondary | ICD-10-CM | POA: Diagnosis not present

## 2017-08-12 DIAGNOSIS — M419 Scoliosis, unspecified: Secondary | ICD-10-CM | POA: Diagnosis not present

## 2017-08-12 DIAGNOSIS — F419 Anxiety disorder, unspecified: Secondary | ICD-10-CM | POA: Diagnosis not present

## 2017-08-12 DIAGNOSIS — I4891 Unspecified atrial fibrillation: Secondary | ICD-10-CM | POA: Diagnosis not present

## 2017-08-12 DIAGNOSIS — Z7982 Long term (current) use of aspirin: Secondary | ICD-10-CM | POA: Diagnosis not present

## 2017-08-12 DIAGNOSIS — I2510 Atherosclerotic heart disease of native coronary artery without angina pectoris: Secondary | ICD-10-CM | POA: Insufficient documentation

## 2017-08-12 DIAGNOSIS — K589 Irritable bowel syndrome without diarrhea: Secondary | ICD-10-CM | POA: Diagnosis not present

## 2017-08-12 DIAGNOSIS — M8580 Other specified disorders of bone density and structure, unspecified site: Secondary | ICD-10-CM | POA: Diagnosis not present

## 2017-08-12 DIAGNOSIS — M1990 Unspecified osteoarthritis, unspecified site: Secondary | ICD-10-CM | POA: Diagnosis not present

## 2017-08-12 DIAGNOSIS — Z955 Presence of coronary angioplasty implant and graft: Secondary | ICD-10-CM

## 2017-08-12 DIAGNOSIS — Z7901 Long term (current) use of anticoagulants: Secondary | ICD-10-CM | POA: Insufficient documentation

## 2017-08-12 DIAGNOSIS — I10 Essential (primary) hypertension: Secondary | ICD-10-CM | POA: Diagnosis not present

## 2017-08-12 DIAGNOSIS — Z87891 Personal history of nicotine dependence: Secondary | ICD-10-CM | POA: Insufficient documentation

## 2017-08-12 DIAGNOSIS — K219 Gastro-esophageal reflux disease without esophagitis: Secondary | ICD-10-CM | POA: Insufficient documentation

## 2017-08-12 DIAGNOSIS — Z86010 Personal history of colonic polyps: Secondary | ICD-10-CM | POA: Insufficient documentation

## 2017-08-13 NOTE — Progress Notes (Signed)
Cardiology Office Note    Date:  08/14/2017   ID:  Hailey [name], DOB 10/27/1934, MRN [id number]  PCP:  Hailey Anger, MD  Cardiologist: Sinclair Grooms, MD   Chief Complaint  Patient presents with  . Coronary Artery Disease  . Atrial Fibrillation    History of Present Illness:  Hailey [name] is a 82 y.o. female has a history of PSVT, paroxysmal atrial fibrillation on chronic anticoagulation, CAD with RCA stent after atherectomy 2018, and hypertension.  Hailey [name] is doing relatively well.  She is in cardiac rehab.  Exertional energy and breathing is improved since stenting of the right coronary.  No blood in her stool or urine.  She is on both Plavix and Eliquis.  For the past 2 days she has experienced weakness, lightheadedness, and noted lowish blood pressures with systolic recordings around 100 mmHg.   Past Medical History:  Diagnosis Date  . Allergic rhinitis   . Anxiety   . Arthritis    "my whole spine" (07/01/2017)  . Atrial fibrillation ([place])   . Coronary artery disease    10/18 PCI/DES to p/m LCx with cutting balloon to mLcx  . Diverticulosis of colon   . GERD (gastroesophageal reflux disease)   . Hip bursitis 2010   Dr Para March, Post op seroma  . History of colon polyps   . HTN (hypertension)   . IBS (irritable bowel syndrome)    constipation predominant - Dr Earlean Shawl  . Lichen sclerosus   . Osteopenia 11/2016   T score -2.0 FRAX 15%/4.3%  . PAC (premature atrial contraction)    Symptomatiic  . Scoliosis   . SVT (supraventricular tachycardia) ([place])    brief history    Past Surgical History:  Procedure Laterality Date  . ANTERIOR AND POSTERIOR VAGINAL REPAIR  01/2002   Archie Endo 01/23/2011  . APPENDECTOMY  1948  . CARDIAC CATHETERIZATION  06/26/2017  . CORONARY ANGIOPLASTY WITH STENT PLACEMENT  07/01/2017  . CORONARY ATHERECTOMY N/A 07/01/2017   Procedure: CORONARY ATHERECTOMY;  Surgeon: Belva Crome, MD;  Location: [facility];   Service: Cardiovascular;  Laterality: N/A;  . CORONARY STENT INTERVENTION N/A 07/01/2017   Procedure: CORONARY STENT INTERVENTION;  Surgeon: Belva Crome, MD;  Location: [facility];  Service: Cardiovascular;  Laterality: N/A;  . HAMMER TOE SURGERY    . HEMORRHOID BANDING    . HIP SURGERY Left 04/2009   hip examination under anesthesia followed by greater trochanteric bursectomy; iliotibial band tenotomy/notes 01/20/2011  . KNEE BURSECTOMY Right 04/2009   Archie Endo 01/09/2011  . LEFT HEART CATH AND CORONARY ANGIOGRAPHY N/A 06/26/2017   Procedure: LEFT HEART CATH AND CORONARY ANGIOGRAPHY;  Surgeon: Belva Crome, MD;  Location: [facility];  Service: Cardiovascular;  Laterality: N/A;  . PUBOVAGINAL SLING  01/2002   Archie Endo 01/23/2011  . REDUCTION MAMMAPLASTY    . TEMPORARY PACEMAKER N/A 07/01/2017   Procedure: TEMPORARY PACEMAKER;  Surgeon: Belva Crome, MD;  Location: [facility];  Service: Cardiovascular;  Laterality: N/A;  . VAGINAL HYSTERECTOMY  01/2002   Vaginal hysterectomy, bilateral salpingo-oophorectomy/notes 01/23/2011    Current Medications: Outpatient Medications Prior to Visit  Medication Sig Dispense Refill  . apixaban (ELIQUIS) 5 MG TABS tablet Take 5 mg by mouth 2 (two) times daily.    Marland Kitchen atorvastatin (LIPITOR) 80 MG tablet Take 1 tablet (80 mg total) by mouth daily at 6 PM. 90 tablet 1  . cetirizine (ZYRTEC) 10 MG  tablet Take 10 mg by mouth daily as needed for allergies or rhinitis.     . clobetasol cream (TEMOVATE) 0.05 % APPLY NIGHTLY AS DIRECTED. (Patient taking differently: Apply 1 application topically daily as needed (irritation). APPLY NIGHTLY AS DIRECTED.) 30 g 2  . Clobetasol Prop Emollient Base (TEMOVATE E) 0.05 % emollient cream Apply 1 application topically at bedtime as needed (skin irritation).    . clopidogrel (PLAVIX) 75 MG tablet Take 1 tablet (75 mg total) by mouth daily. 30 tablet 11  . diclofenac sodium (VOLTAREN) 1 % GEL APPLY 2 GRAMS  TOPICALLY 4 TIMES DAILY. 100 g 3  . diphenoxylate-atropine (LOMOTIL) 2.5-0.025 MG tablet Take 1 tablet by mouth 4 (four) times daily as needed for diarrhea or loose stools. 60 tablet 1  . DULoxetine (CYMBALTA) 60 MG capsule Take 60 mg by mouth daily.    Marland Kitchen ketotifen (ZADITOR) 0.025 % ophthalmic solution Place 1 drop into both eyes daily as needed (ITCHING).    Marland Kitchen linaclotide (LINZESS) 290 MCG CAPS capsule Take 290 mcg by mouth daily as needed (constipation).    . LORazepam (ATIVAN) 1 MG tablet Take 1 mg by mouth 2 (two) times daily as needed for anxiety or sleep.    . nitroGLYCERIN (NITROSTAT) 0.4 MG SL tablet Place 0.4 mg under the tongue every 5 (five) minutes as needed for chest pain (Call 911 at 3rd dose within 15 minutes.).    Marland Kitchen pantoprazole (PROTONIX) 40 MG tablet Take 1 tablet (40 mg total) by mouth daily. 30 tablet 4  . promethazine (PHENERGAN) 25 MG tablet Take 1 tablet (25 mg total) by mouth every 8 (eight) hours as needed for nausea or vomiting. 30 tablet 0  . sodium chloride (OCEAN) 0.65 % SOLN nasal spray Place 1 spray into both nostrils 2 (two) times daily as needed for congestion. 15 mL 0  . metoprolol succinate (TOPROL-XL) 50 MG 24 hr tablet Take 50 mg by mouth 2 (two) times daily. Take with or immediately following a meal.    . DULoxetine (CYMBALTA) 60 MG capsule TAKE (1) CAPSULE DAILY. (Patient not taking: Reported on 08/14/2017) 30 capsule 5  . ELIQUIS 5 MG TABS tablet TAKE 1 TABLET TWICE DAILY. (Patient not taking: No sig reported) 180 tablet 2  . linaclotide (LINZESS) 290 MCG CAPS capsule Take 1 capsule (290 mcg total) by mouth daily as needed. (Patient not taking: Reported on 08/14/2017) 30 capsule 5  . LORazepam (ATIVAN) 1 MG tablet TAKE (1) TABLET TWICE A DAY AS NEEDED. (Patient not taking: Reported on 08/14/2017) 180 tablet 1  . metoprolol succinate (TOPROL-XL) 50 MG 24 hr tablet TAKE 1 TABLET TWICE DAILY. (Patient not taking: Reported on 08/14/2017) 180 tablet 3  . nitroGLYCERIN  (NITROSTAT) 0.4 MG SL tablet Place 1 tablet (0.4 mg total) under the tongue every 5 (five) minutes as needed. (Patient not taking: Reported on 08/14/2017) 25 tablet 2   No facility-administered medications prior to visit.      Allergies:   Macrobid [nitrofurantoin monohyd macro]; Meloxicam; Digoxin and related; and Sulfamethoxazole-trimethoprim   Social History   Socioeconomic History  . Marital status: Married    Spouse name: None  . Number of children: 2  . Years of education: None  . Highest education level: None  Social Needs  . Financial resource strain: None  . Food insecurity - worry: None  . Food insecurity - inability: None  . Transportation needs - medical: None  . Transportation needs - non-medical: None  Occupational History  .  Occupation: Patent attorney: RETIRED  Tobacco Use  . Smoking status: Former Smoker    Packs/day: 0.25    Years: 28.00    Pack years: 7.00    Types: Cigarettes    Last attempt to quit: 1981    Years since quitting: 37.9  . Smokeless tobacco: Never Used  Substance and Sexual Activity  . Alcohol use: Yes    Alcohol/week: 12.6 oz    Types: 7 Standard drinks or equivalent, 14 Shots of liquor per week    Comment: 07/01/2017 "couple shots/day"  . Drug use: No  . Sexual activity: Yes    Birth control/protection: Surgical    Comment: 1st intercourse 82 yo--Fewer than 5 partners  Other Topics Concern  . None  Social History Narrative   Regular Exercise -  YES           Family History:  The patient's family history includes Cancer in her brother, father, and son; Colon cancer in her mother; Diabetes in her father.   ROS:   Please see the history of present illness.    Doing well at rehab.  Spells of fatigue.  Easy bruising Plavix/Eliquis. All other systems reviewed and are negative.   PHYSICAL EXAM:   VS:  BP 106/74   Pulse (!) 48   Ht 5\' 4"  (1.626 m)   Wt 169 lb 6.4 oz (76.8 kg)   BMI 29.08 kg/m    GEN: Well nourished,  well developed, in no acute distress  HEENT: normal  Neck: no JVD, carotid bruits, or masses Cardiac: IIRR; no murmurs, rubs, or gallops,no edema  Respiratory:  clear to auscultation bilaterally, normal work of breathing GI: soft, nontender, nondistended, + BS MS: no deformity or atrophy  Skin: warm and dry, no rash Neuro:  Alert and Oriented x 3, Strength and sensation are intact Psych: euthymic mood, full affect  Wt Readings from Last 3 Encounters:  08/14/17 169 lb 6.4 oz (76.8 kg)  07/30/17 172 lb 9.9 oz (78.3 kg)  07/23/17 171 lb 4 oz (77.7 kg)      Studies/Labs Reviewed:   EKG:  EKG  Not performed.  Recent Labs: 09/13/2016: ALT 9 07/09/2017: BUN 24; Creatinine, Ser 1.21; Potassium 4.6; Sodium 135 07/23/2017: Hemoglobin 11.2; Platelets 307   Lipid Panel    Component Value Date/Time   CHOL 171 11/26/2016 0844   TRIG 99 11/26/2016 0844   TRIG 183 (H) 07/26/2006 0903   HDL 90 11/26/2016 0844   CHOLHDL 1.9 11/26/2016 0844   CHOLHDL 2.2 07/04/2014 0528   VLDL 19 07/04/2014 0528   LDLCALC 61 11/26/2016 0844   LDLDIRECT 89.1 06/29/2013 1604    Additional studies/ records that were reviewed today include:  No new data    ASSESSMENT:    1. Coronary artery disease involving native coronary artery of native heart with angina pectoris ([place])   2. Essential hypertension   3. 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 Chronic anticoagulation      PLAN:  In order of problems listed above:  1. Stable post proximal RCA stenting.  Aspirin has been discontinued and now on Plavix and Eliquis.  Patient. 2. Relatively low blood pressure and heart rate. Decrease metoprolol succinate to 50 mg once daily. 3. Continue metoprolol succinate but at decreased rate 4. Long-term anticoagulation therapy is required. 5. Eliquis without bleeding.   Clinical follow-up in 6 months.  Call if problems.  Continue phase 2  cardiac rehab.   Medication  Adjustments/Labs and Tests Ordered: Current medicines are reviewed at length with the patient today.  Concerns regarding medicines are outlined above.  Medication changes, Labs and Tests ordered today are listed in the Patient Instructions below. Patient Instructions  Medication Instructions:  1) DECREASE Metoprolol Succinate to 50mg  once daily  Labwork: None  Testing/Procedures: None  Follow-Up: Your physician wants you to follow-up in: 4-6 months with Dr. Tamala Julian.  You will receive a reminder letter in the mail two months in advance. If you don't receive a letter, please call our office to schedule the follow-up appointment.   Any Other Special Instructions Will Be Listed Below (If Applicable).     If you need a refill on your cardiac medications before your next appointment, please call your pharmacy.      Signed, Sinclair Grooms, MD  08/14/2017 12:19 PM    [facility] [place], [place],   [address] Phone: [phone]; Fax: [phone]

## 2017-08-14 ENCOUNTER — Encounter: Payer: Self-pay | Admitting: Interventional Cardiology

## 2017-08-14 ENCOUNTER — Ambulatory Visit: Payer: Medicare Other | Admitting: Interventional Cardiology

## 2017-08-14 VITALS — BP 106/74 | HR 48 | Ht 64.0 in | Wt 169.4 lb

## 2017-08-14 DIAGNOSIS — I25119 Atherosclerotic heart disease of native coronary artery with unspecified angina pectoris: Secondary | ICD-10-CM

## 2017-08-14 DIAGNOSIS — D681 Hereditary factor XI deficiency: Secondary | ICD-10-CM

## 2017-08-14 DIAGNOSIS — Z7901 Long term (current) use of anticoagulants: Secondary | ICD-10-CM | POA: Diagnosis not present

## 2017-08-14 DIAGNOSIS — I480 Paroxysmal atrial fibrillation: Secondary | ICD-10-CM

## 2017-08-14 DIAGNOSIS — I10 Essential (primary) hypertension: Secondary | ICD-10-CM | POA: Diagnosis not present

## 2017-08-14 MED ORDER — METOPROLOL SUCCINATE ER 50 MG PO TB24: 50.0000 mg | ORAL_TABLET | Freq: Every day | ORAL | 3 refills | Status: DC

## 2017-08-14 NOTE — Patient Instructions (Signed)
Medication Instructions:  1) DECREASE Metoprolol Succinate to 50mg  once daily  Labwork: None  Testing/Procedures: None  Follow-Up: Your physician wants you to follow-up in: 4-6 months with Dr. Tamala Julian.  You will receive a reminder letter in the mail two months in advance. If you don't receive a letter, please call our office to schedule the follow-up appointment.   Any Other Special Instructions Will Be Listed Below (If Applicable).     If you need a refill on your cardiac medications before your next appointment, please call your pharmacy.

## 2017-08-16 ENCOUNTER — Encounter (HOSPITAL_COMMUNITY): Payer: Medicare Other

## 2017-08-19 ENCOUNTER — Encounter (HOSPITAL_COMMUNITY): Payer: Medicare Other

## 2017-08-21 ENCOUNTER — Encounter (HOSPITAL_COMMUNITY): Payer: Medicare Other

## 2017-08-23 ENCOUNTER — Encounter (HOSPITAL_COMMUNITY): Payer: Medicare Other

## 2017-08-26 ENCOUNTER — Encounter (HOSPITAL_COMMUNITY)
Admission: RE | Admit: 2017-08-26 | Discharge: 2017-08-26 | Disposition: A | Discharge status: Home / Self Care | Payer: Medicare Other | Source: Ambulatory Visit | Attending: Interventional Cardiology | Admitting: Interventional Cardiology

## 2017-08-26 DIAGNOSIS — K219 Gastro-esophageal reflux disease without esophagitis: Secondary | ICD-10-CM | POA: Diagnosis not present

## 2017-08-26 DIAGNOSIS — Z7902 Long term (current) use of antithrombotics/antiplatelets: Secondary | ICD-10-CM | POA: Diagnosis not present

## 2017-08-26 DIAGNOSIS — M8580 Other specified disorders of bone density and structure, unspecified site: Secondary | ICD-10-CM | POA: Diagnosis not present

## 2017-08-26 DIAGNOSIS — Z79899 Other long term (current) drug therapy: Secondary | ICD-10-CM | POA: Diagnosis not present

## 2017-08-26 DIAGNOSIS — M1990 Unspecified osteoarthritis, unspecified site: Secondary | ICD-10-CM | POA: Diagnosis not present

## 2017-08-26 DIAGNOSIS — I2510 Atherosclerotic heart disease of native coronary artery without angina pectoris: Secondary | ICD-10-CM | POA: Diagnosis not present

## 2017-08-26 DIAGNOSIS — Z7901 Long term (current) use of anticoagulants: Secondary | ICD-10-CM | POA: Diagnosis not present

## 2017-08-26 DIAGNOSIS — M419 Scoliosis, unspecified: Secondary | ICD-10-CM | POA: Diagnosis not present

## 2017-08-26 DIAGNOSIS — I10 Essential (primary) hypertension: Secondary | ICD-10-CM | POA: Diagnosis not present

## 2017-08-26 DIAGNOSIS — I4891 Unspecified atrial fibrillation: Secondary | ICD-10-CM | POA: Diagnosis not present

## 2017-08-26 DIAGNOSIS — Z7982 Long term (current) use of aspirin: Secondary | ICD-10-CM | POA: Diagnosis not present

## 2017-08-26 DIAGNOSIS — Z955 Presence of coronary angioplasty implant and graft: Secondary | ICD-10-CM

## 2017-08-26 DIAGNOSIS — K589 Irritable bowel syndrome without diarrhea: Secondary | ICD-10-CM | POA: Diagnosis not present

## 2017-08-26 DIAGNOSIS — Z87891 Personal history of nicotine dependence: Secondary | ICD-10-CM | POA: Diagnosis not present

## 2017-08-26 DIAGNOSIS — Z86010 Personal history of colonic polyps: Secondary | ICD-10-CM | POA: Diagnosis not present

## 2017-08-27 NOTE — Progress Notes (Addendum)
Reviewed home exercise with pt today.  Pt plans to go to fitness center at [employer] for exercise. Pt understands how to use equipment safely. Reviewed THR, pulse, RPE, sign and symptoms, NTG use, and when to call 911 or MD.  Also discussed weather considerations and indoor options.  Pt voiced understanding.   Dorna Bloom, MS,ACSM RCEP

## 2017-08-28 ENCOUNTER — Encounter (HOSPITAL_COMMUNITY)
Admission: RE | Admit: 2017-08-28 | Discharge: 2017-08-28 | Disposition: A | Discharge status: Home / Self Care | Payer: Medicare Other | Source: Ambulatory Visit | Attending: Interventional Cardiology | Admitting: Interventional Cardiology

## 2017-08-28 DIAGNOSIS — Z86010 Personal history of colonic polyps: Secondary | ICD-10-CM | POA: Diagnosis not present

## 2017-08-28 DIAGNOSIS — K589 Irritable bowel syndrome without diarrhea: Secondary | ICD-10-CM | POA: Diagnosis not present

## 2017-08-28 DIAGNOSIS — Z955 Presence of coronary angioplasty implant and graft: Secondary | ICD-10-CM

## 2017-08-28 DIAGNOSIS — M1990 Unspecified osteoarthritis, unspecified site: Secondary | ICD-10-CM | POA: Diagnosis not present

## 2017-08-28 DIAGNOSIS — I4891 Unspecified atrial fibrillation: Secondary | ICD-10-CM | POA: Diagnosis not present

## 2017-08-28 DIAGNOSIS — K219 Gastro-esophageal reflux disease without esophagitis: Secondary | ICD-10-CM | POA: Diagnosis not present

## 2017-08-28 DIAGNOSIS — Z7982 Long term (current) use of aspirin: Secondary | ICD-10-CM | POA: Diagnosis not present

## 2017-08-28 DIAGNOSIS — Z7902 Long term (current) use of antithrombotics/antiplatelets: Secondary | ICD-10-CM | POA: Diagnosis not present

## 2017-08-28 DIAGNOSIS — I10 Essential (primary) hypertension: Secondary | ICD-10-CM | POA: Diagnosis not present

## 2017-08-28 DIAGNOSIS — Z87891 Personal history of nicotine dependence: Secondary | ICD-10-CM | POA: Diagnosis not present

## 2017-08-28 DIAGNOSIS — M8580 Other specified disorders of bone density and structure, unspecified site: Secondary | ICD-10-CM | POA: Diagnosis not present

## 2017-08-28 DIAGNOSIS — I2510 Atherosclerotic heart disease of native coronary artery without angina pectoris: Secondary | ICD-10-CM | POA: Diagnosis not present

## 2017-08-28 DIAGNOSIS — M419 Scoliosis, unspecified: Secondary | ICD-10-CM | POA: Diagnosis not present

## 2017-08-28 DIAGNOSIS — Z7901 Long term (current) use of anticoagulants: Secondary | ICD-10-CM | POA: Diagnosis not present

## 2017-08-28 DIAGNOSIS — Z79899 Other long term (current) drug therapy: Secondary | ICD-10-CM | POA: Diagnosis not present

## 2017-08-30 ENCOUNTER — Encounter (HOSPITAL_COMMUNITY)
Admission: RE | Admit: 2017-08-30 | Discharge: 2017-08-30 | Disposition: A | Discharge status: Home / Self Care | Payer: Medicare Other | Source: Ambulatory Visit | Attending: Interventional Cardiology | Admitting: Interventional Cardiology

## 2017-08-30 DIAGNOSIS — I4891 Unspecified atrial fibrillation: Secondary | ICD-10-CM | POA: Diagnosis not present

## 2017-08-30 DIAGNOSIS — Z955 Presence of coronary angioplasty implant and graft: Secondary | ICD-10-CM | POA: Diagnosis not present

## 2017-08-30 DIAGNOSIS — I2510 Atherosclerotic heart disease of native coronary artery without angina pectoris: Secondary | ICD-10-CM | POA: Diagnosis not present

## 2017-08-30 DIAGNOSIS — K219 Gastro-esophageal reflux disease without esophagitis: Secondary | ICD-10-CM | POA: Diagnosis not present

## 2017-08-30 DIAGNOSIS — Z86010 Personal history of colonic polyps: Secondary | ICD-10-CM | POA: Diagnosis not present

## 2017-08-30 DIAGNOSIS — M419 Scoliosis, unspecified: Secondary | ICD-10-CM | POA: Diagnosis not present

## 2017-08-30 DIAGNOSIS — Z7982 Long term (current) use of aspirin: Secondary | ICD-10-CM | POA: Diagnosis not present

## 2017-08-30 DIAGNOSIS — I10 Essential (primary) hypertension: Secondary | ICD-10-CM | POA: Diagnosis not present

## 2017-08-30 DIAGNOSIS — Z7902 Long term (current) use of antithrombotics/antiplatelets: Secondary | ICD-10-CM | POA: Diagnosis not present

## 2017-08-30 DIAGNOSIS — Z79899 Other long term (current) drug therapy: Secondary | ICD-10-CM | POA: Diagnosis not present

## 2017-08-30 DIAGNOSIS — M8580 Other specified disorders of bone density and structure, unspecified site: Secondary | ICD-10-CM | POA: Diagnosis not present

## 2017-08-30 DIAGNOSIS — Z7901 Long term (current) use of anticoagulants: Secondary | ICD-10-CM | POA: Diagnosis not present

## 2017-08-30 DIAGNOSIS — Z87891 Personal history of nicotine dependence: Secondary | ICD-10-CM | POA: Diagnosis not present

## 2017-08-30 DIAGNOSIS — K589 Irritable bowel syndrome without diarrhea: Secondary | ICD-10-CM | POA: Diagnosis not present

## 2017-08-30 DIAGNOSIS — M1990 Unspecified osteoarthritis, unspecified site: Secondary | ICD-10-CM | POA: Diagnosis not present

## 2017-09-04 ENCOUNTER — Encounter (HOSPITAL_COMMUNITY)
Admission: RE | Admit: 2017-09-04 | Discharge: 2017-09-04 | Disposition: A | Discharge status: Home / Self Care | Payer: Medicare Other | Source: Ambulatory Visit | Attending: Interventional Cardiology | Admitting: Interventional Cardiology

## 2017-09-04 DIAGNOSIS — Z7982 Long term (current) use of aspirin: Secondary | ICD-10-CM | POA: Diagnosis not present

## 2017-09-04 DIAGNOSIS — Z955 Presence of coronary angioplasty implant and graft: Secondary | ICD-10-CM | POA: Diagnosis not present

## 2017-09-04 DIAGNOSIS — Z86010 Personal history of colonic polyps: Secondary | ICD-10-CM | POA: Diagnosis not present

## 2017-09-04 DIAGNOSIS — M419 Scoliosis, unspecified: Secondary | ICD-10-CM | POA: Diagnosis not present

## 2017-09-04 DIAGNOSIS — I4891 Unspecified atrial fibrillation: Secondary | ICD-10-CM | POA: Diagnosis not present

## 2017-09-04 DIAGNOSIS — Z7901 Long term (current) use of anticoagulants: Secondary | ICD-10-CM | POA: Diagnosis not present

## 2017-09-04 DIAGNOSIS — I10 Essential (primary) hypertension: Secondary | ICD-10-CM | POA: Diagnosis not present

## 2017-09-04 DIAGNOSIS — I2510 Atherosclerotic heart disease of native coronary artery without angina pectoris: Secondary | ICD-10-CM | POA: Diagnosis not present

## 2017-09-04 DIAGNOSIS — Z87891 Personal history of nicotine dependence: Secondary | ICD-10-CM | POA: Diagnosis not present

## 2017-09-04 DIAGNOSIS — K589 Irritable bowel syndrome without diarrhea: Secondary | ICD-10-CM | POA: Diagnosis not present

## 2017-09-04 DIAGNOSIS — K219 Gastro-esophageal reflux disease without esophagitis: Secondary | ICD-10-CM | POA: Diagnosis not present

## 2017-09-04 DIAGNOSIS — Z7902 Long term (current) use of antithrombotics/antiplatelets: Secondary | ICD-10-CM | POA: Diagnosis not present

## 2017-09-04 DIAGNOSIS — M1990 Unspecified osteoarthritis, unspecified site: Secondary | ICD-10-CM | POA: Diagnosis not present

## 2017-09-04 DIAGNOSIS — M8580 Other specified disorders of bone density and structure, unspecified site: Secondary | ICD-10-CM | POA: Diagnosis not present

## 2017-09-04 DIAGNOSIS — Z79899 Other long term (current) drug therapy: Secondary | ICD-10-CM | POA: Diagnosis not present

## 2017-09-05 ENCOUNTER — Other Ambulatory Visit: Payer: Self-pay | Admitting: Gynecology

## 2017-09-05 DIAGNOSIS — Z1231 Encounter for screening mammogram for malignant neoplasm of breast: Secondary | ICD-10-CM

## 2017-09-05 NOTE — Progress Notes (Signed)
Cardiac Individual Treatment Plan  Patient Details  Name: Hailey [name] MRN: [id number] Date of Birth: [birth year] Referring Provider:     CARDIAC REHAB PHASE II ORIENTATION from 07/30/2017 in [place]  Referring Provider  Daneen Schick MD      Initial Encounter Date:    CARDIAC REHAB PHASE II ORIENTATION from 07/30/2017 in [place]  Date  07/30/17  Referring Provider  Daneen Schick MD      Visit Diagnosis: Stented coronary artery 07/01/17 DES RCA  Patient's Home Medications on Admission:  Current Outpatient Medications:  .  apixaban (ELIQUIS) 5 MG TABS tablet, Take 5 mg by mouth 2 (two) times daily., Disp: , Rfl:  .  atorvastatin (LIPITOR) 80 MG tablet, Take 1 tablet (80 mg total) by mouth daily at 6 PM., Disp: 90 tablet, Rfl: 1 .  cetirizine (ZYRTEC) 10 MG tablet, Take 10 mg by mouth daily as needed for allergies or rhinitis. , Disp: , Rfl:  .  clobetasol cream (TEMOVATE) 0.05 %, APPLY NIGHTLY AS DIRECTED. (Patient taking differently: Apply 1 application topically daily as needed (irritation). APPLY NIGHTLY AS DIRECTED.), Disp: 30 g, Rfl: 2 .  Clobetasol Prop Emollient Base (TEMOVATE E) 0.05 % emollient cream, Apply 1 application topically at bedtime as needed (skin irritation)., Disp: , Rfl:  .  clopidogrel (PLAVIX) 75 MG tablet, Take 1 tablet (75 mg total) by mouth daily., Disp: 30 tablet, Rfl: 11 .  diclofenac sodium (VOLTAREN) 1 % GEL, APPLY 2 GRAMS TOPICALLY 4 TIMES DAILY., Disp: 100 g, Rfl: 3 .  diphenoxylate-atropine (LOMOTIL) 2.5-0.025 MG tablet, Take 1 tablet by mouth 4 (four) times daily as needed for diarrhea or loose stools., Disp: 60 tablet, Rfl: 1 .  DULoxetine (CYMBALTA) 60 MG capsule, Take 60 mg by mouth daily., Disp: , Rfl:  .  ketotifen (ZADITOR) 0.025 % ophthalmic solution, Place 1 drop into both eyes daily as needed (ITCHING)., Disp: , Rfl:  .  linaclotide (LINZESS) 290 MCG CAPS capsule, Take 290  mcg by mouth daily as needed (constipation)., Disp: , Rfl:  .  LORazepam (ATIVAN) 1 MG tablet, Take 1 mg by mouth 2 (two) times daily as needed for anxiety or sleep., Disp: , Rfl:  .  metoprolol succinate (TOPROL-XL) 50 MG 24 hr tablet, Take 1 tablet (50 mg total) by mouth daily. Take with or immediately following a meal., Disp: 90 tablet, Rfl: 3 .  nitroGLYCERIN (NITROSTAT) 0.4 MG SL tablet, Place 0.4 mg under the tongue every 5 (five) minutes as needed for chest pain (Call 911 at 3rd dose within 15 minutes.)., Disp: , Rfl:  .  pantoprazole (PROTONIX) 40 MG tablet, Take 1 tablet (40 mg total) by mouth daily., Disp: 30 tablet, Rfl: 4 .  promethazine (PHENERGAN) 25 MG tablet, Take 1 tablet (25 mg total) by mouth every 8 (eight) hours as needed for nausea or vomiting., Disp: 30 tablet, Rfl: 0 .  sodium chloride (OCEAN) 0.65 % SOLN nasal spray, Place 1 spray into both nostrils 2 (two) times daily as needed for congestion., Disp: 15 mL, Rfl: 0  Past Medical History: Past Medical History:  Diagnosis Date  . Allergic rhinitis   . Anxiety   . Arthritis    "my whole spine" (07/01/2017)  . Atrial fibrillation ([place])   . Coronary artery disease    10/18 PCI/DES to p/m LCx with cutting balloon to mLcx  . Diverticulosis of colon   . GERD (gastroesophageal reflux disease)   .  Hip bursitis 2010   Dr Para March, Post op seroma  . History of colon polyps   . HTN (hypertension)   . IBS (irritable bowel syndrome)    constipation predominant - Dr Earlean Shawl  . Lichen sclerosus   . Osteopenia 11/2016   T score -2.0 FRAX 15%/4.3%  . PAC (premature atrial contraction)    Symptomatiic  . Scoliosis   . SVT (supraventricular tachycardia) ([place])    brief history    Tobacco Use: Social History   Tobacco Use  Smoking Status Former Smoker  . Packs/day: 0.25  . Years: 28.00  . Pack years: 7.00  . Types: Cigarettes  . Last attempt to quit: 1981  . Years since quitting: 38.0  Smokeless Tobacco Never Used     Labs: Recent Review Flowsheet Data    Labs for ITP Cardiac and Pulmonary Rehab Latest Ref Rng & Units 10/31/2011 06/29/2013 09/01/2013 07/04/2014 11/26/2016   Cholestrol 100 - 199 mg/dL 173 208(H) - 153 171   LDLCALC 0 - 99 mg/dL 60 - - 65 61   LDLDIRECT mg/dL - 89.1 - - -   HDL >39 mg/dL 89.70 97.00 - 69 90   Trlycerides 0 - 149 mg/dL 118.0 138.0 - 97 99   Hemoglobin A1c <5.7 % - - - 5.6 -   TCO2 0 - 100 mmol/L - - 25 - -      Capillary Blood Glucose: Lab Results  Component Value Date   GLUCAP 87 09/01/2013     Exercise Target Goals:    Exercise Program Goal: Individual exercise prescription set with THRR, safety & activity barriers. Participant demonstrates ability to understand and report RPE using BORG scale, to self-measure pulse accurately, and to acknowledge the importance of the exercise prescription.  Exercise Prescription Goal: Starting with aerobic activity 30 plus minutes a day, 3 days per week for initial exercise prescription. Provide home exercise prescription and guidelines that participant acknowledges understanding prior to discharge.  Activity Barriers & Risk Stratification: Activity Barriers & Cardiac Risk Stratification - 07/30/17 1548      Activity Barriers & Cardiac Risk Stratification   Activity Barriers  Arthritis;Back Problems;Other (comment)    Comments  arthritis in hip     Cardiac Risk Stratification  High       6 Minute Walk: 6 Minute Walk    Row Name 07/30/17 1540         6 Minute Walk   Phase  Initial     Distance  1328 feet     Walk Time  6 minutes     # of Rest Breaks  0     MPH  2.5     METS  2.06     RPE  12     VO2 Peak  7.22     Symptoms  Yes (comment)     Comments  mild SOB and brealthlessness at end of walk test     Resting HR  57 bpm     Resting BP  122/68     Resting Oxygen Saturation   96 %     Exercise Oxygen Saturation  during 6 min walk  97 %     Max Ex. HR  89 bpm     Max Ex. BP  126/84     2 Minute Post  BP  104/78        Oxygen Initial Assessment:   Oxygen Re-Evaluation:   Oxygen Discharge (Final Oxygen Re-Evaluation):   Initial Exercise Prescription: Initial  Exercise Prescription - 07/30/17 1500      Date of Initial Exercise RX and Referring Provider   Date  07/30/17    Referring Provider  Daneen Schick MD      Treadmill   MPH  2.2    Grade  0    Minutes  10    METs  2.68      Recumbant Bike   Level  1.5    Watts  5    Minutes  10    METs  2.2      NuStep   Level  2    SPM  80    Minutes  10    METs  2      Prescription Details   Frequency (times per week)  3    Duration  Progress to 30 minutes of continuous aerobic without signs/symptoms of physical distress      Intensity   THRR 40-80% of Max Heartrate  56-112    Ratings of Perceived Exertion  11-13    Perceived Dyspnea  0-4      Progression   Progression  Continue to progress workloads to maintain intensity without signs/symptoms of physical distress.      Resistance Training   Training Prescription  Yes    Weight  2lbs    Reps  10-15       Perform Capillary Blood Glucose checks as needed.  Exercise Prescription Changes: Exercise Prescription Changes    Row Name 08/05/17 1627 08/12/17 1614 08/26/17 1609         Response to Exercise   Blood Pressure (Admit)  117/87  127/89  122/84     Blood Pressure (Exercise)  114/78  136/78  136/78     Blood Pressure (Exit)  108/60  118/80  114/70     Heart Rate (Admit)  62 bpm  57 bpm  84 bpm     Heart Rate (Exercise)  100 bpm  99 bpm  104 bpm     Heart Rate (Exit)  58 bpm  54 bpm  83 bpm     Rating of Perceived Exertion (Exercise)  13  13  13      Symptoms  none  none  none     Comments  pt oriented to exercise equipment today   -  -     Duration  Continue with 30 min of aerobic exercise without signs/symptoms of physical distress.  Continue with 30 min of aerobic exercise without signs/symptoms of physical distress.  Continue with 30 min of aerobic  exercise without signs/symptoms of physical distress.     Intensity  THRR unchanged  THRR unchanged  THRR unchanged       Progression   Progression  Continue to progress workloads to maintain intensity without signs/symptoms of physical distress.  Continue to progress workloads to maintain intensity without signs/symptoms of physical distress.  Continue to progress workloads to maintain intensity without signs/symptoms of physical distress.     Average METs  2.3  2.8  2.8       Resistance Training   Training Prescription  Yes  Yes  Yes     Weight  2lbs  3lbs  3lbs     Reps  10-15  10-15  10-15     Time  10 Minutes  10 Minutes  10 Minutes       Treadmill   MPH  2.2  2.3  2.3     Grade  1  2  2     Minutes  10  10  10      METs  2.68  3.39  3.39       Recumbant Bike   Level  1.5  2  2      Watts  5  25  25      Minutes  10  10  10      METs  2  3  3        NuStep   Level  2  3  3      SPM  80  80  80     Minutes  10  10  10      METs  2  2.2  2       Home Exercise Plan   Plans to continue exercise at  -  -  [employer] (comment) [employer]     Frequency  -  -  Add 2 additional days to program exercise sessions.     Initial Home Exercises Provided  -  -  08/26/17        Exercise Comments: Exercise Comments    Row Name 08/06/17 1629 08/27/17 1613         Exercise Comments  Pt was oriented to exercise equipment on 08/05/17. Pt responded well to exercise program; will continue to monitor pt's progress and activity level.  Reviewed METs and goals. Pt is tolerating exercise very well; will continue to monitor pt's progress and activity levels         Exercise Goals and Review: Exercise Goals    Row Name 07/30/17 1550             Exercise Goals   Increase Physical Activity  Yes       Intervention  Provide advice, education, support and counseling about physical activity/exercise needs.;Develop an individualized exercise prescription for aerobic and resistive  training based on initial evaluation findings, risk stratification, comorbidities and participant's personal goals.       Expected Outcomes  Achievement of increased cardiorespiratory fitness and enhanced flexibility, muscular endurance and strength shown through measurements of functional capacity and personal statement of participant.       Increase Strength and Stamina  Yes       Intervention  Provide advice, education, support and counseling about physical activity/exercise needs.;Develop an individualized exercise prescription for aerobic and resistive training based on initial evaluation findings, risk stratification, comorbidities and participant's personal goals.       Expected Outcomes  Achievement of increased cardiorespiratory fitness and enhanced flexibility, muscular endurance and strength shown through measurements of functional capacity and personal statement of participant.       Able to understand and use rate of perceived exertion (RPE) scale  Yes       Intervention  Provide education and explanation on how to use RPE scale       Expected Outcomes  Short Term: Able to use RPE daily in rehab to express subjective intensity level;Long Term:  Able to use RPE to guide intensity level when exercising independently       Knowledge and understanding of Target Heart Rate Range (THRR)  Yes       Intervention  Provide education and explanation of THRR including how the numbers were predicted and where they are located for reference       Expected Outcomes  Short Term: Able to state/look up THRR;Long Term: Able to use THRR to govern intensity when exercising independently;Short Term: Able to use daily  as guideline for intensity in rehab       Able to check pulse independently  Yes       Intervention  Provide education and demonstration on how to check pulse in carotid and radial arteries.;Review the importance of being able to check your own pulse for safety during independent exercise        Expected Outcomes  Short Term: Able to explain why pulse checking is important during independent exercise;Long Term: Able to check pulse independently and accurately       Understanding of Exercise Prescription  Yes       Intervention  Provide education, explanation, and written materials on patient's individual exercise prescription       Expected Outcomes  Short Term: Able to explain program exercise prescription;Long Term: Able to explain home exercise prescription to exercise independently          Exercise Goals Re-Evaluation : Exercise Goals Re-Evaluation    Row Name 08/06/17 1631 08/27/17 1611           Exercise Goal Re-Evaluation   Exercise Goals Review  Able to understand and use rate of perceived exertion (RPE) scale  Increase Physical Activity;Able to understand and use rate of perceived exertion (RPE) scale;Knowledge and understanding of Target Heart Rate Range (THRR);Understanding of Exercise Prescription;Increase Strength and Stamina;Able to check pulse independently      Comments  Pt demonstrated proper use of RPE scale and did well with exercise session today.  Pt is progressing well in cardiac rehab. Pt has tolerated WL increases very well. Pt is able to exercise without difficulty. Pt will initate HEP this week in which she will use cardio equipment at [facility]      Expected Outcomes  Pt will improve in cardiorespiratory fitness and improve in strength and endurance.   Pt will improve in cardiorespiratory fitness and improve in strength and endurance.           Discharge Exercise Prescription (Final Exercise Prescription Changes): Exercise Prescription Changes - 08/26/17 1609      Response to Exercise   Blood Pressure (Admit)  122/84    Blood Pressure (Exercise)  136/78    Blood Pressure (Exit)  114/70    Heart Rate (Admit)  84 bpm    Heart Rate (Exercise)  104 bpm    Heart Rate (Exit)  83 bpm    Rating of Perceived Exertion (Exercise)  13     Symptoms  none    Duration  Continue with 30 min of aerobic exercise without signs/symptoms of physical distress.    Intensity  THRR unchanged      Progression   Progression  Continue to progress workloads to maintain intensity without signs/symptoms of physical distress.    Average METs  2.8      Resistance Training   Training Prescription  Yes    Weight  3lbs    Reps  10-15    Time  10 Minutes      Treadmill   MPH  2.3    Grade  2    Minutes  10    METs  3.39      Recumbant Bike   Level  2    Watts  25    Minutes  10    METs  3      NuStep   Level  3    SPM  80    Minutes  10    METs  2  Home Exercise Plan   Plans to continue exercise at  [facility] (comment) [employer]    Frequency  Add 2 additional days to program exercise sessions.    Initial Home Exercises Provided  08/26/17       Nutrition:  Target Goals: Understanding of nutrition guidelines, daily intake of sodium 1500mg , cholesterol 200mg , calories 30% from fat and 7% or less from saturated fats, daily to have 5 or more servings of fruits and vegetables.  Biometrics: Pre Biometrics - 07/30/17 1652      Pre Biometrics   Height  5' 3.5" (1.613 m)    Weight  172 lb 9.9 oz (78.3 kg)    Waist Circumference  29.5 inches    Hip Circumference  44.5 inches    Waist to Hip Ratio  0.66 %    BMI (Calculated)  30.09    Triceps Skinfold  26 mm    % Body Fat  38.7 %    Grip Strength  25 kg    Flexibility  8.5 in    Single Leg Stand  6 seconds      Post Biometrics - 07/30/17 1527       Post  Biometrics   Height  5' 3.5" (1.613 m)    Weight  172 lb 9.9 oz (78.3 kg)    Waist Circumference  29.5 inches    Hip Circumference  44.5 inches    Waist to Hip Ratio  0.66 %    BMI (Calculated)  30.09    Triceps Skinfold  26 mm    % Body Fat  38.7 %    Grip Strength  25 kg    Flexibility  8.5 in    Single Leg Stand  6 seconds       Nutrition Therapy Plan and Nutrition Goals: Nutrition  Therapy & Goals - 07/30/17 1447      Nutrition Therapy   Diet  Heart Healthy      Personal Nutrition Goals   Nutrition Goal  Pt to identify food quantities necessary to achieve weight loss of 6-24 lb at graduation from cardiac rehab.       Intervention Plan   Intervention  Prescribe, educate and counsel regarding individualized specific dietary modifications aiming towards targeted core components such as weight, hypertension, lipid management, diabetes, heart failure and other comorbidities.    Expected Outcomes  Short Term Goal: Understand basic principles of dietary content, such as calories, fat, sodium, cholesterol and nutrients.;Long Term Goal: Adherence to prescribed nutrition plan.       Nutrition Discharge: Nutrition Scores: Nutrition Assessments - 07/30/17 1447      MEDFICTS Scores   Pre Score  55       Nutrition Goals Re-Evaluation:   Nutrition Goals Re-Evaluation:   Nutrition Goals Discharge (Final Nutrition Goals Re-Evaluation):   Psychosocial: Target Goals: Acknowledge presence or absence of significant depression and/or stress, maximize coping skills, provide positive support system. Participant is able to verbalize types and ability to use techniques and skills needed for reducing stress and depression.  Initial Review & Psychosocial Screening: Initial Psych Review & Screening - 07/30/17 1432      Initial Review   Current issues with  None Identified      Family Dynamics   Good Support System?  Yes spouse     Comments  no psychosocial needs identified, no interventions necessary       Barriers   Psychosocial barriers to participate in program  There are no identifiable  barriers or psychosocial needs.      Screening Interventions   Interventions  Encouraged to exercise;Provide feedback about the scores to participant       Quality of Life Scores: Quality of Life - 07/30/17 1443      Quality of Life Scores   Health/Function Pre  24.13 %     Socioeconomic Pre  26.63 %    Psych/Spiritual Pre  20.21 %    Family Pre  25.2 %    GLOBAL Pre  24.07 %       PHQ-9: Recent Review Flowsheet Data    Depression screen [facility] 2/9 08/05/2017 07/11/2017 05/29/2016 12/08/2015 12/08/2015   Decreased Interest 0 0 0 0 0   Down, Depressed, Hopeless 0 1 0 0 0   PHQ - 2 Score 0 1 0 0 0     Interpretation of Total Score  Total Score Depression Severity:  1-4 = Minimal depression, 5-9 = Mild depression, 10-14 = Moderate depression, 15-19 = Moderately severe depression, 20-27 = Severe depression   Psychosocial Evaluation and Intervention:   Psychosocial Re-Evaluation: Psychosocial Re-Evaluation    Row Name 09/05/17 1601             Psychosocial Re-Evaluation   Current issues with  None Identified       Interventions  Encouraged to attend Cardiac Rehabilitation for the exercise       Continue Psychosocial Services   No Follow up required          Psychosocial Discharge (Final Psychosocial Re-Evaluation): Psychosocial Re-Evaluation - 09/05/17 1601      Psychosocial Re-Evaluation   Current issues with  None Identified    Interventions  Encouraged to attend Cardiac Rehabilitation for the exercise    Continue Psychosocial Services   No Follow up required       Vocational Rehabilitation: Provide vocational rehab assistance to qualifying candidates.   Vocational Rehab Evaluation & Intervention: Vocational Rehab - 07/30/17 1432      Initial Vocational Rehab Evaluation & Intervention   Assessment shows need for Vocational Rehabilitation  No retired        Education: Education Goals: Education classes will be provided on a weekly basis, covering required topics. Participant will state understanding/return demonstration of topics presented.  Learning Barriers/Preferences: Learning Barriers/Preferences - 07/30/17 1443      Learning Barriers/Preferences   Learning Barriers  Sight    Learning Preferences  Verbal Instruction;Written  Material       Education Topics: Count Your Pulse:  -Group instruction provided by verbal instruction, demonstration, patient participation and written materials to support subject.  Instructors address importance of being able to find your pulse and how to count your pulse when at home without a heart monitor.  Patients get hands on experience counting their pulse with staff help and individually.   Heart Attack, Angina, and Risk Factor Modification:  -Group instruction provided by verbal instruction, video, and written materials to support subject.  Instructors address signs and symptoms of angina and heart attacks.    Also discuss risk factors for heart disease and how to make changes to improve heart health risk factors.   Functional Fitness:  -Group instruction provided by verbal instruction, demonstration, patient participation, and written materials to support subject.  Instructors address safety measures for doing things around the house.  Discuss how to get up and down off the floor, how to pick things up properly, how to safely get out of a chair without assistance, and balance training.  Meditation and Mindfulness:  -Group instruction provided by verbal instruction, patient participation, and written materials to support subject.  Instructor addresses importance of mindfulness and meditation practice to help reduce stress and improve awareness.  Instructor also leads participants through a meditation exercise.    Stretching for Flexibility and Mobility:  -Group instruction provided by verbal instruction, patient participation, and written materials to support subject.  Instructors lead participants through series of stretches that are designed to increase flexibility thus improving mobility.  These stretches are additional exercise for major muscle groups that are typically performed during regular warm up and cool down.   CARDIAC REHAB PHASE II EXERCISE from 09/04/2017 in [place]  Date  08/09/17  Instruction Review Code  2- meets goals/outcomes      Hands Only CPR:  -Group verbal, video, and participation provides a basic overview of AHA guidelines for community CPR. Role-play of emergencies allow participants the opportunity to practice calling for help and chest compression technique with discussion of AED use.   Hypertension: -Group verbal and written instruction that provides a basic overview of hypertension including the most recent diagnostic guidelines, risk factor reduction with self-care instructions and medication management.    Nutrition I class: Heart Healthy Eating:  -Group instruction provided by PowerPoint slides, verbal discussion, and written materials to support subject matter. The instructor gives an explanation and review of the Therapeutic Lifestyle Changes diet recommendations, which includes a discussion on lipid goals, dietary fat, sodium, fiber, plant stanol/sterol esters, sugar, and the components of a well-balanced, healthy diet.   Nutrition II class: Lifestyle Skills:  -Group instruction provided by PowerPoint slides, verbal discussion, and written materials to support subject matter. The instructor gives an explanation and review of label reading, grocery shopping for heart health, heart healthy recipe modifications, and ways to make healthier choices when eating out.   Diabetes Question & Answer:  -Group instruction provided by PowerPoint slides, verbal discussion, and written materials to support subject matter. The instructor gives an explanation and review of diabetes co-morbidities, pre- and post-prandial blood glucose goals, pre-exercise blood glucose goals, signs, symptoms, and treatment of hypoglycemia and hyperglycemia, and foot care basics.   Diabetes Blitz:  -Group instruction provided by PowerPoint slides, verbal discussion, and written materials to support subject matter. The instructor  gives an explanation and review of the physiology behind type 1 and type 2 diabetes, diabetes medications and rational behind using different medications, pre- and post-prandial blood glucose recommendations and Hemoglobin A1c goals, diabetes diet, and exercise including blood glucose guidelines for exercising safely.    Portion Distortion:  -Group instruction provided by PowerPoint slides, verbal discussion, written materials, and food models to support subject matter. The instructor gives an explanation of serving size versus portion size, changes in portions sizes over the last 20 years, and what consists of a serving from each food group.   CARDIAC REHAB PHASE II EXERCISE from 09/04/2017 in [place]  Date  08/28/17  Educator  RD  Instruction Review Code  2- meets goals/outcomes      Stress Management:  -Group instruction provided by verbal instruction, video, and written materials to support subject matter.  Instructors review role of stress in heart disease and how to cope with stress positively.     CARDIAC REHAB PHASE II EXERCISE from 09/04/2017 in [place]  Date  09/04/17  Instruction Review Code  2- meets goals/outcomes  Exercising on Your Own:  -Group instruction provided by verbal instruction, power point, and written materials to support subject.  Instructors discuss benefits of exercise, components of exercise, frequency and intensity of exercise, and end points for exercise.  Also discuss use of nitroglycerin and activating EMS.  Review options of places to exercise outside of rehab.  Review guidelines for sex with heart disease.   Cardiac Drugs I:  -Group instruction provided by verbal instruction and written materials to support subject.  Instructor reviews cardiac drug classes: antiplatelets, anticoagulants, beta blockers, and statins.  Instructor discusses reasons, side effects, and lifestyle  considerations for each drug class.   Cardiac Drugs II:  -Group instruction provided by verbal instruction and written materials to support subject.  Instructor reviews cardiac drug classes: angiotensin converting enzyme inhibitors (ACE-I), angiotensin II receptor blockers (ARBs), nitrates, and calcium channel blockers.  Instructor discusses reasons, side effects, and lifestyle considerations for each drug class.   Anatomy and Physiology of the Circulatory System:  Group verbal and written instruction and models provide basic cardiac anatomy and physiology, with the coronary electrical and arterial systems. Review of: AMI, Angina, Valve disease, Heart Failure, Peripheral Artery Disease, Cardiac Arrhythmia, Pacemakers, and the ICD.   CARDIAC REHAB PHASE II EXERCISE from 09/04/2017 in [facility]  Date  08/07/17  Instruction Review Code  2- meets goals/outcomes      Other Education:  -Group or individual verbal, written, or video instructions that support the educational goals of the cardiac rehab program.   Knowledge Questionnaire Score: Knowledge Questionnaire Score - 07/30/17 1442      Knowledge Questionnaire Score   Pre Score  19/24       Core Components/Risk Factors/Patient Goals at Admission: Personal Goals and Risk Factors at Admission - 07/30/17 1525      Core Components/Risk Factors/Patient Goals on Admission    Weight Management  Yes;Obesity;Weight Maintenance;Weight Loss    Intervention  Weight Management: Develop a combined nutrition and exercise program designed to reach desired caloric intake, while maintaining appropriate intake of nutrient and fiber, sodium and fats, and appropriate energy expenditure required for the weight goal.;Weight Management: Provide education and appropriate resources to help participant work on and attain dietary goals.;Weight Management/Obesity: Establish reasonable short term and long term weight goals.;Obesity:  Provide education and appropriate resources to help participant work on and attain dietary goals.    Admit Weight  172 lb 9.9 oz (78.3 kg)    Goal Weight: Short Term  168 lb (76.2 kg)    Goal Weight: Long Term  162 lb (73.5 kg)    Expected Outcomes  Short Term: Continue to assess and modify interventions until short term weight is achieved;Long Term: Adherence to nutrition and physical activity/exercise program aimed toward attainment of established weight goal;Weight Maintenance: Understanding of the daily nutrition guidelines, which includes 25-35% calories from fat, 7% or less cal from saturated fats, less than 200mg  cholesterol, less than 1.5gm of sodium, & 5 or more servings of fruits and vegetables daily;Weight Loss: Understanding of general recommendations for a balanced deficit meal plan, which promotes 1-2 lb weight loss per week and includes a negative energy balance of [phone] kcal/d;Understanding recommendations for meals to include 15-35% energy as protein, 25-35% energy from fat, 35-60% energy from carbohydrates, less than 200mg  of dietary cholesterol, 20-35 gm of total fiber daily;Understanding of distribution of calorie intake throughout the day with the consumption of 4-5 meals/snacks    Hypertension  Yes    Intervention  Provide education on lifestyle modifcations including regular physical activity/exercise, weight management, moderate sodium restriction and increased consumption of fresh fruit, vegetables, and low fat dairy, alcohol moderation, and smoking cessation.;Monitor prescription use compliance.    Expected Outcomes  Short Term: Continued assessment and intervention until BP is < 140/55mm HG in hypertensive participants. < 130/74mm HG in hypertensive participants with diabetes, heart failure or chronic kidney disease.;Long Term: Maintenance of blood pressure at goal levels.    Stress  Yes    Intervention  Offer individual and/or small group education and counseling on adjustment  to heart disease, stress management and health-related lifestyle change. Teach and support self-help strategies.;Refer participants experiencing significant psychosocial distress to appropriate mental health specialists for further evaluation and treatment. When possible, include family members and significant others in education/counseling sessions.    Expected Outcomes  Short Term: Participant demonstrates changes in health-related behavior, relaxation and other stress management skills, ability to obtain effective social support, and compliance with psychotropic medications if prescribed.;Long Term: Emotional wellbeing is indicated by absence of clinically significant psychosocial distress or social isolation.       Core Components/Risk Factors/Patient Goals Review:  Goals and Risk Factor Review    Row Name 09/05/17 1559             Core Components/Risk Factors/Patient Goals Review   Personal Goals Review  Weight Management/Obesity;Lipids;Hypertension       Review  Corky's vital signs have been stable at cardiac rehab. Corky has been doing well with exercise.       Expected Outcomes  Corky will continue to participate in phase 2 cardiac rehab. Corky will continue to take her medicaitons as presribed          Core Components/Risk Factors/Patient Goals at Discharge (Final Review):  Goals and Risk Factor Review - 09/05/17 1559      Core Components/Risk Factors/Patient Goals Review   Personal Goals Review  Weight Management/Obesity;Lipids;Hypertension    Review  Corky's vital signs have been stable at cardiac rehab. Corky has been doing well with exercise.    Expected Outcomes  Corky will continue to participate in phase 2 cardiac rehab. Corky will continue to take her medicaitons as presribed       ITP Comments: ITP Comments    Row Name 07/30/17 1431 08/09/17 1419 09/05/17 1559       ITP Comments  Dr. Fransico Him, Medical Director  30 Day ITP Review, Patient with good partcipation  and good attendance in cardiac rehab  30 Day ITP Review, Patient with good partcipation and good attendance in cardiac rehab        Comments: See ITP comments.Harrell Gave RN BSN

## 2017-09-06 ENCOUNTER — Encounter (HOSPITAL_COMMUNITY)
Admission: RE | Admit: 2017-09-06 | Discharge: 2017-09-06 | Disposition: A | Discharge status: Home / Self Care | Payer: Medicare Other | Source: Ambulatory Visit | Attending: Interventional Cardiology | Admitting: Interventional Cardiology

## 2017-09-06 DIAGNOSIS — I10 Essential (primary) hypertension: Secondary | ICD-10-CM | POA: Diagnosis not present

## 2017-09-06 DIAGNOSIS — Z79899 Other long term (current) drug therapy: Secondary | ICD-10-CM | POA: Diagnosis not present

## 2017-09-06 DIAGNOSIS — Z955 Presence of coronary angioplasty implant and graft: Secondary | ICD-10-CM | POA: Diagnosis not present

## 2017-09-06 DIAGNOSIS — K219 Gastro-esophageal reflux disease without esophagitis: Secondary | ICD-10-CM | POA: Diagnosis not present

## 2017-09-06 DIAGNOSIS — K589 Irritable bowel syndrome without diarrhea: Secondary | ICD-10-CM | POA: Diagnosis not present

## 2017-09-06 DIAGNOSIS — Z7901 Long term (current) use of anticoagulants: Secondary | ICD-10-CM | POA: Diagnosis not present

## 2017-09-06 DIAGNOSIS — M8580 Other specified disorders of bone density and structure, unspecified site: Secondary | ICD-10-CM | POA: Diagnosis not present

## 2017-09-06 DIAGNOSIS — M419 Scoliosis, unspecified: Secondary | ICD-10-CM | POA: Diagnosis not present

## 2017-09-06 DIAGNOSIS — I2510 Atherosclerotic heart disease of native coronary artery without angina pectoris: Secondary | ICD-10-CM | POA: Diagnosis not present

## 2017-09-06 DIAGNOSIS — M1990 Unspecified osteoarthritis, unspecified site: Secondary | ICD-10-CM | POA: Diagnosis not present

## 2017-09-06 DIAGNOSIS — I4891 Unspecified atrial fibrillation: Secondary | ICD-10-CM | POA: Diagnosis not present

## 2017-09-06 DIAGNOSIS — Z86010 Personal history of colonic polyps: Secondary | ICD-10-CM | POA: Diagnosis not present

## 2017-09-06 DIAGNOSIS — Z7902 Long term (current) use of antithrombotics/antiplatelets: Secondary | ICD-10-CM | POA: Diagnosis not present

## 2017-09-06 DIAGNOSIS — Z87891 Personal history of nicotine dependence: Secondary | ICD-10-CM | POA: Diagnosis not present

## 2017-09-06 DIAGNOSIS — Z7982 Long term (current) use of aspirin: Secondary | ICD-10-CM | POA: Diagnosis not present

## 2017-09-09 ENCOUNTER — Encounter (HOSPITAL_COMMUNITY)
Admission: RE | Admit: 2017-09-09 | Discharge: 2017-09-09 | Disposition: A | Discharge status: Home / Self Care | Payer: Medicare Other | Source: Ambulatory Visit | Attending: Interventional Cardiology | Admitting: Interventional Cardiology

## 2017-09-09 DIAGNOSIS — K589 Irritable bowel syndrome without diarrhea: Secondary | ICD-10-CM | POA: Diagnosis not present

## 2017-09-09 DIAGNOSIS — M419 Scoliosis, unspecified: Secondary | ICD-10-CM | POA: Diagnosis not present

## 2017-09-09 DIAGNOSIS — Z7902 Long term (current) use of antithrombotics/antiplatelets: Secondary | ICD-10-CM | POA: Diagnosis not present

## 2017-09-09 DIAGNOSIS — M1990 Unspecified osteoarthritis, unspecified site: Secondary | ICD-10-CM | POA: Diagnosis not present

## 2017-09-09 DIAGNOSIS — Z87891 Personal history of nicotine dependence: Secondary | ICD-10-CM | POA: Diagnosis not present

## 2017-09-09 DIAGNOSIS — Z79899 Other long term (current) drug therapy: Secondary | ICD-10-CM | POA: Diagnosis not present

## 2017-09-09 DIAGNOSIS — Z86010 Personal history of colonic polyps: Secondary | ICD-10-CM | POA: Diagnosis not present

## 2017-09-09 DIAGNOSIS — I4891 Unspecified atrial fibrillation: Secondary | ICD-10-CM | POA: Diagnosis not present

## 2017-09-09 DIAGNOSIS — Z955 Presence of coronary angioplasty implant and graft: Secondary | ICD-10-CM | POA: Diagnosis not present

## 2017-09-09 DIAGNOSIS — K219 Gastro-esophageal reflux disease without esophagitis: Secondary | ICD-10-CM | POA: Diagnosis not present

## 2017-09-09 DIAGNOSIS — Z7901 Long term (current) use of anticoagulants: Secondary | ICD-10-CM | POA: Diagnosis not present

## 2017-09-09 DIAGNOSIS — I10 Essential (primary) hypertension: Secondary | ICD-10-CM | POA: Diagnosis not present

## 2017-09-09 DIAGNOSIS — M8580 Other specified disorders of bone density and structure, unspecified site: Secondary | ICD-10-CM | POA: Diagnosis not present

## 2017-09-09 DIAGNOSIS — I2510 Atherosclerotic heart disease of native coronary artery without angina pectoris: Secondary | ICD-10-CM | POA: Diagnosis not present

## 2017-09-09 DIAGNOSIS — Z7982 Long term (current) use of aspirin: Secondary | ICD-10-CM | POA: Diagnosis not present

## 2017-09-11 ENCOUNTER — Encounter (HOSPITAL_COMMUNITY)
Admission: RE | Admit: 2017-09-11 | Discharge: 2017-09-11 | Disposition: A | Discharge status: Home / Self Care | Payer: Medicare Other | Source: Ambulatory Visit | Attending: Interventional Cardiology | Admitting: Interventional Cardiology

## 2017-09-11 DIAGNOSIS — M8580 Other specified disorders of bone density and structure, unspecified site: Secondary | ICD-10-CM | POA: Insufficient documentation

## 2017-09-11 DIAGNOSIS — I2510 Atherosclerotic heart disease of native coronary artery without angina pectoris: Secondary | ICD-10-CM | POA: Insufficient documentation

## 2017-09-11 DIAGNOSIS — Z7901 Long term (current) use of anticoagulants: Secondary | ICD-10-CM | POA: Insufficient documentation

## 2017-09-11 DIAGNOSIS — F419 Anxiety disorder, unspecified: Secondary | ICD-10-CM | POA: Insufficient documentation

## 2017-09-11 DIAGNOSIS — Z7902 Long term (current) use of antithrombotics/antiplatelets: Secondary | ICD-10-CM | POA: Diagnosis not present

## 2017-09-11 DIAGNOSIS — Z7982 Long term (current) use of aspirin: Secondary | ICD-10-CM | POA: Diagnosis not present

## 2017-09-11 DIAGNOSIS — Z86010 Personal history of colonic polyps: Secondary | ICD-10-CM | POA: Insufficient documentation

## 2017-09-11 DIAGNOSIS — I10 Essential (primary) hypertension: Secondary | ICD-10-CM | POA: Insufficient documentation

## 2017-09-11 DIAGNOSIS — K219 Gastro-esophageal reflux disease without esophagitis: Secondary | ICD-10-CM | POA: Insufficient documentation

## 2017-09-11 DIAGNOSIS — Z955 Presence of coronary angioplasty implant and graft: Secondary | ICD-10-CM | POA: Diagnosis not present

## 2017-09-11 DIAGNOSIS — Z79899 Other long term (current) drug therapy: Secondary | ICD-10-CM | POA: Insufficient documentation

## 2017-09-11 DIAGNOSIS — M419 Scoliosis, unspecified: Secondary | ICD-10-CM | POA: Diagnosis not present

## 2017-09-11 DIAGNOSIS — M1990 Unspecified osteoarthritis, unspecified site: Secondary | ICD-10-CM | POA: Insufficient documentation

## 2017-09-11 DIAGNOSIS — Z87891 Personal history of nicotine dependence: Secondary | ICD-10-CM | POA: Diagnosis not present

## 2017-09-11 DIAGNOSIS — K589 Irritable bowel syndrome without diarrhea: Secondary | ICD-10-CM | POA: Diagnosis not present

## 2017-09-11 DIAGNOSIS — I4891 Unspecified atrial fibrillation: Secondary | ICD-10-CM | POA: Diagnosis not present

## 2017-09-11 NOTE — Progress Notes (Signed)
Hailey [name] 83 y.o. 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 Nutrition plan and goals reviewed with pt. Pt is following Step 1 of the Therapeutic Lifestyle Changes diet. Age-appropriate nutrition recommendations discussed. Pt expressed understanding of the information reviewed. Pt aware of nutrition education classes offered.  Nutrition Diagnosis ? Food-and nutrition-related knowledge deficit related to lack of exposure to information as related to diagnosis of: ? CVD ? Overweight related to excessive energy intake as evidenced by a BMI of 29.4  Nutrition Intervention ? Pt's individual nutrition plan reviewed with pt. ? Benefits of adopting Heart Healthy diet discussed when [employer] reviewed.   ? Pt given handouts for: ? Nutrition I class ? Nutrition II class   Nutrition Goal(s):  ? Pt to identify food quantities necessary to achieve weight loss of 6-24 lb at graduation from cardiac rehab.   Plan:  Pt to attend nutrition classes ? Portion Distortion - met 08/28/17 Will provide client-centered nutrition education as part of interdisciplinary care.   Monitor and evaluate progress toward nutrition goal with team.  Derek Mound, M.Ed, RD, LDN, CDE 09/11/2017 1:54 PM

## 2017-09-13 ENCOUNTER — Encounter (HOSPITAL_COMMUNITY)
Admission: RE | Admit: 2017-09-13 | Discharge: 2017-09-13 | Disposition: A | Discharge status: Home / Self Care | Payer: Medicare Other | Source: Ambulatory Visit | Attending: Interventional Cardiology | Admitting: Interventional Cardiology

## 2017-09-13 DIAGNOSIS — M419 Scoliosis, unspecified: Secondary | ICD-10-CM | POA: Diagnosis not present

## 2017-09-13 DIAGNOSIS — Z7901 Long term (current) use of anticoagulants: Secondary | ICD-10-CM | POA: Diagnosis not present

## 2017-09-13 DIAGNOSIS — Z7902 Long term (current) use of antithrombotics/antiplatelets: Secondary | ICD-10-CM | POA: Diagnosis not present

## 2017-09-13 DIAGNOSIS — Z87891 Personal history of nicotine dependence: Secondary | ICD-10-CM | POA: Diagnosis not present

## 2017-09-13 DIAGNOSIS — Z79899 Other long term (current) drug therapy: Secondary | ICD-10-CM | POA: Diagnosis not present

## 2017-09-13 DIAGNOSIS — Z955 Presence of coronary angioplasty implant and graft: Secondary | ICD-10-CM | POA: Diagnosis not present

## 2017-09-13 DIAGNOSIS — K219 Gastro-esophageal reflux disease without esophagitis: Secondary | ICD-10-CM | POA: Diagnosis not present

## 2017-09-13 DIAGNOSIS — I10 Essential (primary) hypertension: Secondary | ICD-10-CM | POA: Diagnosis not present

## 2017-09-13 DIAGNOSIS — I4891 Unspecified atrial fibrillation: Secondary | ICD-10-CM | POA: Diagnosis not present

## 2017-09-13 DIAGNOSIS — M8580 Other specified disorders of bone density and structure, unspecified site: Secondary | ICD-10-CM | POA: Diagnosis not present

## 2017-09-13 DIAGNOSIS — M1990 Unspecified osteoarthritis, unspecified site: Secondary | ICD-10-CM | POA: Diagnosis not present

## 2017-09-13 DIAGNOSIS — Z7982 Long term (current) use of aspirin: Secondary | ICD-10-CM | POA: Diagnosis not present

## 2017-09-13 DIAGNOSIS — K589 Irritable bowel syndrome without diarrhea: Secondary | ICD-10-CM | POA: Diagnosis not present

## 2017-09-13 DIAGNOSIS — I2510 Atherosclerotic heart disease of native coronary artery without angina pectoris: Secondary | ICD-10-CM | POA: Diagnosis not present

## 2017-09-13 DIAGNOSIS — Z86010 Personal history of colonic polyps: Secondary | ICD-10-CM | POA: Diagnosis not present

## 2017-09-16 ENCOUNTER — Encounter (HOSPITAL_COMMUNITY)
Admission: RE | Admit: 2017-09-16 | Discharge: 2017-09-16 | Disposition: A | Discharge status: Home / Self Care | Payer: Medicare Other | Source: Ambulatory Visit | Attending: Interventional Cardiology | Admitting: Interventional Cardiology

## 2017-09-16 DIAGNOSIS — Z86010 Personal history of colonic polyps: Secondary | ICD-10-CM | POA: Diagnosis not present

## 2017-09-16 DIAGNOSIS — K589 Irritable bowel syndrome without diarrhea: Secondary | ICD-10-CM | POA: Diagnosis not present

## 2017-09-16 DIAGNOSIS — Z87891 Personal history of nicotine dependence: Secondary | ICD-10-CM | POA: Diagnosis not present

## 2017-09-16 DIAGNOSIS — M419 Scoliosis, unspecified: Secondary | ICD-10-CM | POA: Diagnosis not present

## 2017-09-16 DIAGNOSIS — M1990 Unspecified osteoarthritis, unspecified site: Secondary | ICD-10-CM | POA: Diagnosis not present

## 2017-09-16 DIAGNOSIS — Z79899 Other long term (current) drug therapy: Secondary | ICD-10-CM | POA: Diagnosis not present

## 2017-09-16 DIAGNOSIS — Z955 Presence of coronary angioplasty implant and graft: Secondary | ICD-10-CM | POA: Diagnosis not present

## 2017-09-16 DIAGNOSIS — Z7901 Long term (current) use of anticoagulants: Secondary | ICD-10-CM | POA: Diagnosis not present

## 2017-09-16 DIAGNOSIS — Z7902 Long term (current) use of antithrombotics/antiplatelets: Secondary | ICD-10-CM | POA: Diagnosis not present

## 2017-09-16 DIAGNOSIS — Z7982 Long term (current) use of aspirin: Secondary | ICD-10-CM | POA: Diagnosis not present

## 2017-09-16 DIAGNOSIS — I2510 Atherosclerotic heart disease of native coronary artery without angina pectoris: Secondary | ICD-10-CM | POA: Diagnosis not present

## 2017-09-16 DIAGNOSIS — K219 Gastro-esophageal reflux disease without esophagitis: Secondary | ICD-10-CM | POA: Diagnosis not present

## 2017-09-16 DIAGNOSIS — M8580 Other specified disorders of bone density and structure, unspecified site: Secondary | ICD-10-CM | POA: Diagnosis not present

## 2017-09-16 DIAGNOSIS — I10 Essential (primary) hypertension: Secondary | ICD-10-CM | POA: Diagnosis not present

## 2017-09-16 DIAGNOSIS — I4891 Unspecified atrial fibrillation: Secondary | ICD-10-CM | POA: Diagnosis not present

## 2017-09-18 ENCOUNTER — Encounter (HOSPITAL_COMMUNITY)
Admission: RE | Admit: 2017-09-18 | Discharge: 2017-09-18 | Disposition: A | Discharge status: Home / Self Care | Payer: Medicare Other | Source: Ambulatory Visit | Attending: Interventional Cardiology | Admitting: Interventional Cardiology

## 2017-09-18 DIAGNOSIS — M8580 Other specified disorders of bone density and structure, unspecified site: Secondary | ICD-10-CM | POA: Diagnosis not present

## 2017-09-18 DIAGNOSIS — K219 Gastro-esophageal reflux disease without esophagitis: Secondary | ICD-10-CM | POA: Diagnosis not present

## 2017-09-18 DIAGNOSIS — Z7902 Long term (current) use of antithrombotics/antiplatelets: Secondary | ICD-10-CM | POA: Diagnosis not present

## 2017-09-18 DIAGNOSIS — Z7982 Long term (current) use of aspirin: Secondary | ICD-10-CM | POA: Diagnosis not present

## 2017-09-18 DIAGNOSIS — K589 Irritable bowel syndrome without diarrhea: Secondary | ICD-10-CM | POA: Diagnosis not present

## 2017-09-18 DIAGNOSIS — M1990 Unspecified osteoarthritis, unspecified site: Secondary | ICD-10-CM | POA: Diagnosis not present

## 2017-09-18 DIAGNOSIS — I2510 Atherosclerotic heart disease of native coronary artery without angina pectoris: Secondary | ICD-10-CM | POA: Diagnosis not present

## 2017-09-18 DIAGNOSIS — Z7901 Long term (current) use of anticoagulants: Secondary | ICD-10-CM | POA: Diagnosis not present

## 2017-09-18 DIAGNOSIS — I10 Essential (primary) hypertension: Secondary | ICD-10-CM | POA: Diagnosis not present

## 2017-09-18 DIAGNOSIS — Z86010 Personal history of colonic polyps: Secondary | ICD-10-CM | POA: Diagnosis not present

## 2017-09-18 DIAGNOSIS — M419 Scoliosis, unspecified: Secondary | ICD-10-CM | POA: Diagnosis not present

## 2017-09-18 DIAGNOSIS — Z87891 Personal history of nicotine dependence: Secondary | ICD-10-CM | POA: Diagnosis not present

## 2017-09-18 DIAGNOSIS — Z955 Presence of coronary angioplasty implant and graft: Secondary | ICD-10-CM

## 2017-09-18 DIAGNOSIS — I4891 Unspecified atrial fibrillation: Secondary | ICD-10-CM | POA: Diagnosis not present

## 2017-09-18 DIAGNOSIS — Z79899 Other long term (current) drug therapy: Secondary | ICD-10-CM | POA: Diagnosis not present

## 2017-09-20 ENCOUNTER — Encounter (HOSPITAL_COMMUNITY)
Admission: RE | Admit: 2017-09-20 | Discharge: 2017-09-20 | Disposition: A | Discharge status: Home / Self Care | Payer: Medicare Other | Source: Ambulatory Visit | Attending: Interventional Cardiology | Admitting: Interventional Cardiology

## 2017-09-20 DIAGNOSIS — Z87891 Personal history of nicotine dependence: Secondary | ICD-10-CM | POA: Diagnosis not present

## 2017-09-20 DIAGNOSIS — K219 Gastro-esophageal reflux disease without esophagitis: Secondary | ICD-10-CM | POA: Diagnosis not present

## 2017-09-20 DIAGNOSIS — M8580 Other specified disorders of bone density and structure, unspecified site: Secondary | ICD-10-CM | POA: Diagnosis not present

## 2017-09-20 DIAGNOSIS — Z955 Presence of coronary angioplasty implant and graft: Secondary | ICD-10-CM

## 2017-09-20 DIAGNOSIS — Z7982 Long term (current) use of aspirin: Secondary | ICD-10-CM | POA: Diagnosis not present

## 2017-09-20 DIAGNOSIS — I10 Essential (primary) hypertension: Secondary | ICD-10-CM | POA: Diagnosis not present

## 2017-09-20 DIAGNOSIS — Z86010 Personal history of colonic polyps: Secondary | ICD-10-CM | POA: Diagnosis not present

## 2017-09-20 DIAGNOSIS — I4891 Unspecified atrial fibrillation: Secondary | ICD-10-CM | POA: Diagnosis not present

## 2017-09-20 DIAGNOSIS — Z79899 Other long term (current) drug therapy: Secondary | ICD-10-CM | POA: Diagnosis not present

## 2017-09-20 DIAGNOSIS — Z7902 Long term (current) use of antithrombotics/antiplatelets: Secondary | ICD-10-CM | POA: Diagnosis not present

## 2017-09-20 DIAGNOSIS — K589 Irritable bowel syndrome without diarrhea: Secondary | ICD-10-CM | POA: Diagnosis not present

## 2017-09-20 DIAGNOSIS — I2510 Atherosclerotic heart disease of native coronary artery without angina pectoris: Secondary | ICD-10-CM | POA: Diagnosis not present

## 2017-09-20 DIAGNOSIS — M419 Scoliosis, unspecified: Secondary | ICD-10-CM | POA: Diagnosis not present

## 2017-09-20 DIAGNOSIS — Z7901 Long term (current) use of anticoagulants: Secondary | ICD-10-CM | POA: Diagnosis not present

## 2017-09-20 DIAGNOSIS — M1990 Unspecified osteoarthritis, unspecified site: Secondary | ICD-10-CM | POA: Diagnosis not present

## 2017-09-23 ENCOUNTER — Encounter (HOSPITAL_COMMUNITY)
Admission: RE | Admit: 2017-09-23 | Discharge: 2017-09-23 | Disposition: A | Discharge status: Home / Self Care | Payer: Medicare Other | Source: Ambulatory Visit | Attending: Interventional Cardiology | Admitting: Interventional Cardiology

## 2017-09-23 DIAGNOSIS — Z7982 Long term (current) use of aspirin: Secondary | ICD-10-CM | POA: Diagnosis not present

## 2017-09-23 DIAGNOSIS — Z7902 Long term (current) use of antithrombotics/antiplatelets: Secondary | ICD-10-CM | POA: Diagnosis not present

## 2017-09-23 DIAGNOSIS — Z955 Presence of coronary angioplasty implant and graft: Secondary | ICD-10-CM

## 2017-09-23 DIAGNOSIS — I10 Essential (primary) hypertension: Secondary | ICD-10-CM | POA: Diagnosis not present

## 2017-09-23 DIAGNOSIS — M419 Scoliosis, unspecified: Secondary | ICD-10-CM | POA: Diagnosis not present

## 2017-09-23 DIAGNOSIS — I2510 Atherosclerotic heart disease of native coronary artery without angina pectoris: Secondary | ICD-10-CM | POA: Diagnosis not present

## 2017-09-23 DIAGNOSIS — M8580 Other specified disorders of bone density and structure, unspecified site: Secondary | ICD-10-CM | POA: Diagnosis not present

## 2017-09-23 DIAGNOSIS — M1990 Unspecified osteoarthritis, unspecified site: Secondary | ICD-10-CM | POA: Diagnosis not present

## 2017-09-23 DIAGNOSIS — I4891 Unspecified atrial fibrillation: Secondary | ICD-10-CM | POA: Diagnosis not present

## 2017-09-23 DIAGNOSIS — Z87891 Personal history of nicotine dependence: Secondary | ICD-10-CM | POA: Diagnosis not present

## 2017-09-23 DIAGNOSIS — Z86010 Personal history of colonic polyps: Secondary | ICD-10-CM | POA: Diagnosis not present

## 2017-09-23 DIAGNOSIS — K219 Gastro-esophageal reflux disease without esophagitis: Secondary | ICD-10-CM | POA: Diagnosis not present

## 2017-09-23 DIAGNOSIS — Z7901 Long term (current) use of anticoagulants: Secondary | ICD-10-CM | POA: Diagnosis not present

## 2017-09-23 DIAGNOSIS — K589 Irritable bowel syndrome without diarrhea: Secondary | ICD-10-CM | POA: Diagnosis not present

## 2017-09-23 DIAGNOSIS — Z79899 Other long term (current) drug therapy: Secondary | ICD-10-CM | POA: Diagnosis not present

## 2017-09-25 ENCOUNTER — Encounter (HOSPITAL_COMMUNITY)
Admission: RE | Admit: 2017-09-25 | Discharge: 2017-09-25 | Disposition: A | Discharge status: Home / Self Care | Payer: Medicare Other | Source: Ambulatory Visit | Attending: Interventional Cardiology | Admitting: Interventional Cardiology

## 2017-09-25 DIAGNOSIS — Z86010 Personal history of colonic polyps: Secondary | ICD-10-CM | POA: Diagnosis not present

## 2017-09-25 DIAGNOSIS — I2510 Atherosclerotic heart disease of native coronary artery without angina pectoris: Secondary | ICD-10-CM | POA: Diagnosis not present

## 2017-09-25 DIAGNOSIS — K589 Irritable bowel syndrome without diarrhea: Secondary | ICD-10-CM | POA: Diagnosis not present

## 2017-09-25 DIAGNOSIS — Z7901 Long term (current) use of anticoagulants: Secondary | ICD-10-CM | POA: Diagnosis not present

## 2017-09-25 DIAGNOSIS — M419 Scoliosis, unspecified: Secondary | ICD-10-CM | POA: Diagnosis not present

## 2017-09-25 DIAGNOSIS — I4891 Unspecified atrial fibrillation: Secondary | ICD-10-CM | POA: Diagnosis not present

## 2017-09-25 DIAGNOSIS — I10 Essential (primary) hypertension: Secondary | ICD-10-CM | POA: Diagnosis not present

## 2017-09-25 DIAGNOSIS — K219 Gastro-esophageal reflux disease without esophagitis: Secondary | ICD-10-CM | POA: Diagnosis not present

## 2017-09-25 DIAGNOSIS — M8580 Other specified disorders of bone density and structure, unspecified site: Secondary | ICD-10-CM | POA: Diagnosis not present

## 2017-09-25 DIAGNOSIS — M1990 Unspecified osteoarthritis, unspecified site: Secondary | ICD-10-CM | POA: Diagnosis not present

## 2017-09-25 DIAGNOSIS — Z955 Presence of coronary angioplasty implant and graft: Secondary | ICD-10-CM

## 2017-09-25 DIAGNOSIS — Z7982 Long term (current) use of aspirin: Secondary | ICD-10-CM | POA: Diagnosis not present

## 2017-09-25 DIAGNOSIS — Z7902 Long term (current) use of antithrombotics/antiplatelets: Secondary | ICD-10-CM | POA: Diagnosis not present

## 2017-09-25 DIAGNOSIS — Z79899 Other long term (current) drug therapy: Secondary | ICD-10-CM | POA: Diagnosis not present

## 2017-09-25 DIAGNOSIS — Z87891 Personal history of nicotine dependence: Secondary | ICD-10-CM | POA: Diagnosis not present

## 2017-09-27 ENCOUNTER — Encounter (HOSPITAL_COMMUNITY)
Admission: RE | Admit: 2017-09-27 | Discharge: 2017-09-27 | Disposition: A | Discharge status: Home / Self Care | Payer: Medicare Other | Source: Ambulatory Visit | Attending: Interventional Cardiology | Admitting: Interventional Cardiology

## 2017-09-27 DIAGNOSIS — M8580 Other specified disorders of bone density and structure, unspecified site: Secondary | ICD-10-CM | POA: Diagnosis not present

## 2017-09-27 DIAGNOSIS — Z87891 Personal history of nicotine dependence: Secondary | ICD-10-CM | POA: Diagnosis not present

## 2017-09-27 DIAGNOSIS — I4891 Unspecified atrial fibrillation: Secondary | ICD-10-CM | POA: Diagnosis not present

## 2017-09-27 DIAGNOSIS — Z955 Presence of coronary angioplasty implant and graft: Secondary | ICD-10-CM

## 2017-09-27 DIAGNOSIS — K589 Irritable bowel syndrome without diarrhea: Secondary | ICD-10-CM | POA: Diagnosis not present

## 2017-09-27 DIAGNOSIS — Z7982 Long term (current) use of aspirin: Secondary | ICD-10-CM | POA: Diagnosis not present

## 2017-09-27 DIAGNOSIS — Z79899 Other long term (current) drug therapy: Secondary | ICD-10-CM | POA: Diagnosis not present

## 2017-09-27 DIAGNOSIS — M419 Scoliosis, unspecified: Secondary | ICD-10-CM | POA: Diagnosis not present

## 2017-09-27 DIAGNOSIS — Z7901 Long term (current) use of anticoagulants: Secondary | ICD-10-CM | POA: Diagnosis not present

## 2017-09-27 DIAGNOSIS — Z86010 Personal history of colonic polyps: Secondary | ICD-10-CM | POA: Diagnosis not present

## 2017-09-27 DIAGNOSIS — M1990 Unspecified osteoarthritis, unspecified site: Secondary | ICD-10-CM | POA: Diagnosis not present

## 2017-09-27 DIAGNOSIS — K219 Gastro-esophageal reflux disease without esophagitis: Secondary | ICD-10-CM | POA: Diagnosis not present

## 2017-09-27 DIAGNOSIS — I2510 Atherosclerotic heart disease of native coronary artery without angina pectoris: Secondary | ICD-10-CM | POA: Diagnosis not present

## 2017-09-27 DIAGNOSIS — Z7902 Long term (current) use of antithrombotics/antiplatelets: Secondary | ICD-10-CM | POA: Diagnosis not present

## 2017-09-27 DIAGNOSIS — I10 Essential (primary) hypertension: Secondary | ICD-10-CM | POA: Diagnosis not present

## 2017-09-30 ENCOUNTER — Encounter (HOSPITAL_COMMUNITY)
Admission: RE | Admit: 2017-09-30 | Discharge: 2017-09-30 | Disposition: A | Discharge status: Home / Self Care | Payer: Medicare Other | Source: Ambulatory Visit | Attending: Interventional Cardiology | Admitting: Interventional Cardiology

## 2017-09-30 ENCOUNTER — Ambulatory Visit
Admission: RE | Admit: 2017-09-30 | Discharge: 2017-09-30 | Disposition: A | Discharge status: Home / Self Care | Payer: Medicare Other | Source: Ambulatory Visit | Attending: Gynecology | Admitting: Gynecology

## 2017-09-30 DIAGNOSIS — Z87891 Personal history of nicotine dependence: Secondary | ICD-10-CM | POA: Diagnosis not present

## 2017-09-30 DIAGNOSIS — M8580 Other specified disorders of bone density and structure, unspecified site: Secondary | ICD-10-CM | POA: Diagnosis not present

## 2017-09-30 DIAGNOSIS — Z7901 Long term (current) use of anticoagulants: Secondary | ICD-10-CM | POA: Diagnosis not present

## 2017-09-30 DIAGNOSIS — Z86010 Personal history of colonic polyps: Secondary | ICD-10-CM | POA: Diagnosis not present

## 2017-09-30 DIAGNOSIS — Z955 Presence of coronary angioplasty implant and graft: Secondary | ICD-10-CM

## 2017-09-30 DIAGNOSIS — M419 Scoliosis, unspecified: Secondary | ICD-10-CM | POA: Diagnosis not present

## 2017-09-30 DIAGNOSIS — I4891 Unspecified atrial fibrillation: Secondary | ICD-10-CM | POA: Diagnosis not present

## 2017-09-30 DIAGNOSIS — I2510 Atherosclerotic heart disease of native coronary artery without angina pectoris: Secondary | ICD-10-CM | POA: Diagnosis not present

## 2017-09-30 DIAGNOSIS — Z79899 Other long term (current) drug therapy: Secondary | ICD-10-CM | POA: Diagnosis not present

## 2017-09-30 DIAGNOSIS — M1990 Unspecified osteoarthritis, unspecified site: Secondary | ICD-10-CM | POA: Diagnosis not present

## 2017-09-30 DIAGNOSIS — K219 Gastro-esophageal reflux disease without esophagitis: Secondary | ICD-10-CM | POA: Diagnosis not present

## 2017-09-30 DIAGNOSIS — I10 Essential (primary) hypertension: Secondary | ICD-10-CM | POA: Diagnosis not present

## 2017-09-30 DIAGNOSIS — Z7982 Long term (current) use of aspirin: Secondary | ICD-10-CM | POA: Diagnosis not present

## 2017-09-30 DIAGNOSIS — K589 Irritable bowel syndrome without diarrhea: Secondary | ICD-10-CM | POA: Diagnosis not present

## 2017-09-30 DIAGNOSIS — Z1231 Encounter for screening mammogram for malignant neoplasm of breast: Secondary | ICD-10-CM | POA: Diagnosis not present

## 2017-09-30 DIAGNOSIS — Z7902 Long term (current) use of antithrombotics/antiplatelets: Secondary | ICD-10-CM | POA: Diagnosis not present

## 2017-10-01 NOTE — Progress Notes (Signed)
Cardiac Individual Treatment Plan  Patient Details  Name: Hailey [name] MRN: [id number] Date of Birth: 1934-09-27 Referring Provider:     CARDIAC REHAB PHASE II ORIENTATION from 07/30/2017 in [facility]  Referring Provider  Daneen Schick MD      Initial Encounter Date:    CARDIAC REHAB PHASE II ORIENTATION from 07/30/2017 in [place]  Date  07/30/17  Referring Provider  Daneen Schick MD      Visit Diagnosis: Stented coronary artery 07/01/17 DES RCA  Patient's Home Medications on Admission:  Current Outpatient Medications:  .  apixaban (ELIQUIS) 5 MG TABS tablet, Take 5 mg by mouth 2 (two) times daily., Disp: , Rfl:  .  atorvastatin (LIPITOR) 80 MG tablet, Take 1 tablet (80 mg total) by mouth daily at 6 PM., Disp: 90 tablet, Rfl: 1 .  cetirizine (ZYRTEC) 10 MG tablet, Take 10 mg by mouth daily as needed for allergies or rhinitis. , Disp: , Rfl:  .  clobetasol cream (TEMOVATE) 0.05 %, APPLY NIGHTLY AS DIRECTED. (Patient taking differently: Apply 1 application topically daily as needed (irritation). APPLY NIGHTLY AS DIRECTED.), Disp: 30 g, Rfl: 2 .  Clobetasol Prop Emollient Base (TEMOVATE E) 0.05 % emollient cream, Apply 1 application topically at bedtime as needed (skin irritation)., Disp: , Rfl:  .  clopidogrel (PLAVIX) 75 MG tablet, Take 1 tablet (75 mg total) by mouth daily., Disp: 30 tablet, Rfl: 11 .  diclofenac sodium (VOLTAREN) 1 % GEL, APPLY 2 GRAMS TOPICALLY 4 TIMES DAILY., Disp: 100 g, Rfl: 3 .  diphenoxylate-atropine (LOMOTIL) 2.5-0.025 MG tablet, Take 1 tablet by mouth 4 (four) times daily as needed for diarrhea or loose stools., Disp: 60 tablet, Rfl: 1 .  DULoxetine (CYMBALTA) 60 MG capsule, Take 60 mg by mouth daily., Disp: , Rfl:  .  ketotifen (ZADITOR) 0.025 % ophthalmic solution, Place 1 drop into both eyes daily as needed (ITCHING)., Disp: , Rfl:  .  linaclotide (LINZESS) 290 MCG CAPS capsule, Take 290  mcg by mouth daily as needed (constipation)., Disp: , Rfl:  .  LORazepam (ATIVAN) 1 MG tablet, Take 1 mg by mouth 2 (two) times daily as needed for anxiety or sleep., Disp: , Rfl:  .  metoprolol succinate (TOPROL-XL) 50 MG 24 hr tablet, Take 1 tablet (50 mg total) by mouth daily. Take with or immediately following a meal., Disp: 90 tablet, Rfl: 3 .  nitroGLYCERIN (NITROSTAT) 0.4 MG SL tablet, Place 0.4 mg under the tongue every 5 (five) minutes as needed for chest pain (Call 911 at 3rd dose within 15 minutes.)., Disp: , Rfl:  .  pantoprazole (PROTONIX) 40 MG tablet, Take 1 tablet (40 mg total) by mouth daily., Disp: 30 tablet, Rfl: 4 .  promethazine (PHENERGAN) 25 MG tablet, Take 1 tablet (25 mg total) by mouth every 8 (eight) hours as needed for nausea or vomiting., Disp: 30 tablet, Rfl: 0 .  sodium chloride (OCEAN) 0.65 % SOLN nasal spray, Place 1 spray into both nostrils 2 (two) times daily as needed for congestion., Disp: 15 mL, Rfl: 0  Past Medical History: Past Medical History:  Diagnosis Date  . Allergic rhinitis   . Anxiety   . Arthritis    "my whole spine" (07/01/2017)  . Atrial fibrillation ([place])   . Coronary artery disease    10/18 PCI/DES to p/m LCx with cutting balloon to mLcx  . Diverticulosis of colon   . GERD (gastroesophageal reflux disease)   .  Hip bursitis 2010   Dr Para March, Post op seroma  . History of colon polyps   . HTN (hypertension)   . IBS (irritable bowel syndrome)    constipation predominant - Dr Earlean Shawl  . Lichen sclerosus   . Osteopenia 11/2016   T score -2.0 FRAX 15%/4.3%  . PAC (premature atrial contraction)    Symptomatiic  . Scoliosis   . SVT (supraventricular tachycardia) ([place])    brief history    Tobacco Use: Social History   Tobacco Use  Smoking Status Former Smoker  . Packs/day: 0.25  . Years: 28.00  . Pack years: 7.00  . Types: Cigarettes  . Last attempt to quit: 1981  . Years since quitting: 38.0  Smokeless Tobacco Never Used     Labs: Recent Review Flowsheet Data    Labs for ITP Cardiac and Pulmonary Rehab Latest Ref Rng & Units 10/31/2011 06/29/2013 09/01/2013 07/04/2014 11/26/2016   Cholestrol 100 - 199 mg/dL 173 208(H) - 153 171   LDLCALC 0 - 99 mg/dL 60 - - 65 61   LDLDIRECT mg/dL - 89.1 - - -   HDL >39 mg/dL 89.70 97.00 - 69 90   Trlycerides 0 - 149 mg/dL 118.0 138.0 - 97 99   Hemoglobin A1c <5.7 % - - - 5.6 -   TCO2 0 - 100 mmol/L - - 25 - -      Capillary Blood Glucose: Lab Results  Component Value Date   GLUCAP 87 09/01/2013     Exercise Target Goals:    Exercise Program Goal: Individual exercise prescription set using results from initial 6 min walk test and THRR while considering  patient's activity barriers and safety.   Exercise Prescription Goal: Initial exercise prescription builds to 30-45 minutes a day of aerobic activity, 2-3 days per week.  Home exercise guidelines will be given to patient during program as part of exercise prescription that the participant will acknowledge.  Activity Barriers & Risk Stratification: Activity Barriers & Cardiac Risk Stratification - 07/30/17 1548      Activity Barriers & Cardiac Risk Stratification   Activity Barriers  Arthritis;Back Problems;Other (comment)    Comments  arthritis in hip     Cardiac Risk Stratification  High       6 Minute Walk: 6 Minute Walk    Row Name 07/30/17 1540         6 Minute Walk   Phase  Initial     Distance  1328 feet     Walk Time  6 minutes     # of Rest Breaks  0     MPH  2.5     METS  2.06     RPE  12     VO2 Peak  7.22     Symptoms  Yes (comment)     Comments  mild SOB and brealthlessness at end of walk test     Resting HR  57 bpm     Resting BP  122/68     Resting Oxygen Saturation   96 %     Exercise Oxygen Saturation  during 6 min walk  97 %     Max Ex. HR  89 bpm     Max Ex. BP  126/84     2 Minute Post BP  104/78        Oxygen Initial Assessment:   Oxygen  Re-Evaluation:   Oxygen Discharge (Final Oxygen Re-Evaluation):   Initial Exercise Prescription: Initial Exercise Prescription - 07/30/17  1500      Date of Initial Exercise RX and Referring Provider   Date  07/30/17    Referring Provider  Daneen Schick MD      Treadmill   MPH  2.2    Grade  0    Minutes  10    METs  2.68      Recumbant Bike   Level  1.5    Watts  5    Minutes  10    METs  2.2      NuStep   Level  2    SPM  80    Minutes  10    METs  2      Prescription Details   Frequency (times per week)  3    Duration  Progress to 30 minutes of continuous aerobic without signs/symptoms of physical distress      Intensity   THRR 40-80% of Max Heartrate  56-112    Ratings of Perceived Exertion  11-13    Perceived Dyspnea  0-4      Progression   Progression  Continue to progress workloads to maintain intensity without signs/symptoms of physical distress.      Resistance Training   Training Prescription  Yes    Weight  2lbs    Reps  10-15       Perform Capillary Blood Glucose checks as needed.  Exercise Prescription Changes:  Exercise Prescription Changes    Row Name 08/05/17 1627 08/12/17 1614 08/26/17 1609 09/18/17 1600 10/02/17 1000     Response to Exercise   Blood Pressure (Admit)  117/87  127/89  122/84  114/70  124/68   Blood Pressure (Exercise)  114/78  136/78  136/78  118/72  138/88   Blood Pressure (Exit)  108/60  118/80  114/70  104/70  114/68   Heart Rate (Admit)  62 bpm  57 bpm  84 bpm  87 bpm  70 bpm   Heart Rate (Exercise)  100 bpm  99 bpm  104 bpm  100 bpm  91 bpm   Heart Rate (Exit)  58 bpm  54 bpm  83 bpm  79 bpm  66 bpm   Rating of Perceived Exertion (Exercise)  13  13  13  12  13    Symptoms  none  none  none  None  None   Comments  pt oriented to exercise equipment today   -  -  -  -   Duration  Continue with 30 min of aerobic exercise without signs/symptoms of physical distress.  Continue with 30 min of aerobic exercise without  signs/symptoms of physical distress.  Continue with 30 min of aerobic exercise without signs/symptoms of physical distress.  Progress to 30 minutes of  aerobic without signs/symptoms of physical distress  Continue with 30 min of aerobic exercise without signs/symptoms of physical distress.   Intensity  THRR unchanged  THRR unchanged  THRR unchanged  THRR unchanged  THRR unchanged     Progression   Progression  Continue to progress workloads to maintain intensity without signs/symptoms of physical distress.  Continue to progress workloads to maintain intensity without signs/symptoms of physical distress.  Continue to progress workloads to maintain intensity without signs/symptoms of physical distress.  Continue to progress workloads to maintain intensity without signs/symptoms of physical distress.  Continue to progress workloads to maintain intensity without signs/symptoms of physical distress.   Average METs  2.3  2.8  2.8  2.35  2.69  Resistance Training   Training Prescription  Yes  Yes  Yes  No  Yes   Weight  2lbs  3lbs  3lbs  -  4lbs   Reps  10-15  10-15  10-15  -  10-15   Time  10 Minutes  10 Minutes  10 Minutes  -  10 Minutes     Treadmill   MPH  2.2  2.3  2.3  -  2.3   Grade  1  2  2   -  2   Minutes  10  10  10   -  10   METs  2.68  3.39  3.39  -  3.39     Recumbant Bike   Level  1.5  2  2   2.5  2.5   Watts  5  25  25  25  25    Minutes  10  10  10  15  10    METs  2  3  3   2.5  2     NuStep   Level  2  3  3  4  4    SPM  80  80  80  80  80   Minutes  10  10  10  15  10    METs  2  2.2  2  2.2  3.39     Home Exercise Plan   Plans to continue exercise at  -  -  [facility] (comment) [employer] (comment) [employer] (comment) [employer] [employer]   Frequency  -  -  Add 2 additional days to program exercise sessions.  Add 2 additional days to program exercise sessions.  Add 2 additional days to program exercise sessions.    Initial Home Exercises Provided  -  -  08/26/17  08/26/17  08/26/17      Exercise Comments:  Exercise Comments    Row Name 08/06/17 1629 08/27/17 1613 09/18/17 1631 10/02/17 1053     Exercise Comments  Pt was oriented to exercise equipment on 08/05/17. Pt responded well to exercise program; will continue to monitor pt's progress and activity level.  Reviewed METs and goals. Pt is tolerating exercise very well; will continue to monitor pt's progress and activity levels  Reveiwed METs with pt. Pt voiced understanding. Pt states she is feeling stronger. Will continue to monitor and progress pt.   Pt is responding well to exercise prescription, despite hip limitations. Pt is doing well and feeling stronger. Will continue to monitor and progress pt.       Exercise Goals and Review:  Exercise Goals    Row Name 07/30/17 1550             Exercise Goals   Increase Physical Activity  Yes       Intervention  Provide advice, education, support and counseling about physical activity/exercise needs.;Develop an individualized exercise prescription for aerobic and resistive training based on initial evaluation findings, risk stratification, comorbidities and participant's personal goals.       Expected Outcomes  Achievement of increased cardiorespiratory fitness and enhanced flexibility, muscular endurance and strength shown through measurements of functional capacity and personal statement of participant.       Increase Strength and Stamina  Yes       Intervention  Provide advice, education, support and counseling about physical activity/exercise needs.;Develop an individualized exercise prescription for aerobic and resistive training based on initial evaluation findings, risk stratification, comorbidities and participant's personal  goals.       Expected Outcomes  Achievement of increased cardiorespiratory fitness and enhanced flexibility, muscular endurance and strength shown through measurements of  functional capacity and personal statement of participant.       Able to understand and use rate of perceived exertion (RPE) scale  Yes       Intervention  Provide education and explanation on how to use RPE scale       Expected Outcomes  Short Term: Able to use RPE daily in rehab to express subjective intensity level;Long Term:  Able to use RPE to guide intensity level when exercising independently       Knowledge and understanding of Target Heart Rate Range (THRR)  Yes       Intervention  Provide education and explanation of THRR including how the numbers were predicted and where they are located for reference       Expected Outcomes  Short Term: Able to state/look up THRR;Long Term: Able to use THRR to govern intensity when exercising independently;Short Term: Able to use daily as guideline for intensity in rehab       Able to check pulse independently  Yes       Intervention  Provide education and demonstration on how to check pulse in carotid and radial arteries.;Review the importance of being able to check your own pulse for safety during independent exercise       Expected Outcomes  Short Term: Able to explain why pulse checking is important during independent exercise;Long Term: Able to check pulse independently and accurately       Understanding of Exercise Prescription  Yes       Intervention  Provide education, explanation, and written materials on patient's individual exercise prescription       Expected Outcomes  Short Term: Able to explain program exercise prescription;Long Term: Able to explain home exercise prescription to exercise independently          Exercise Goals Re-Evaluation : Exercise Goals Re-Evaluation    Row Name 08/06/17 1631 08/27/17 1611 10/02/17 1058 10/02/17 1100       Exercise Goal Re-Evaluation   Exercise Goals Review  Able to understand and use rate of perceived exertion (RPE) scale  Increase Physical Activity;Able to understand and use rate of perceived  exertion (RPE) scale;Knowledge and understanding of Target Heart Rate Range (THRR);Understanding of Exercise Prescription;Increase Strength and Stamina;Able to check pulse independently  Increase Physical Activity;Able to understand and use rate of perceived exertion (RPE) scale;Knowledge and understanding of Target Heart Rate Range (THRR);Understanding of Exercise Prescription;Increase Strength and Stamina;Able to check pulse independently  -    Comments  Pt demonstrated proper use of RPE scale and did well with exercise session today.  Pt is progressing well in cardiac rehab. Pt has tolerated WL increases very well. Pt is able to exercise without difficulty. Pt will initate HEP this week in which she will use cardio equipment at [facility]  Pt is progressing well despite hip limitations.   Pt is progressing well despite hip limitations. Pt is feeling stronger and now able to exercise at a level 4 on Nustep, and use 4lbs handweights. Will continue to monitor and progress pt.     Expected Outcomes  Pt will improve in cardiorespiratory fitness and improve in strength and endurance.   Pt will improve in cardiorespiratory fitness and improve in strength and endurance.   -  Pt will continue to improve cardiorespiratory fitness. Pt is planning to add another  day with trainer at fitness center. Pt is currently only doing 1 day with trainer.         Discharge Exercise Prescription (Final Exercise Prescription Changes): Exercise Prescription Changes - 10/02/17 1000      Response to Exercise   Blood Pressure (Admit)  124/68    Blood Pressure (Exercise)  138/88    Blood Pressure (Exit)  114/68    Heart Rate (Admit)  70 bpm    Heart Rate (Exercise)  91 bpm    Heart Rate (Exit)  66 bpm    Rating of Perceived Exertion (Exercise)  13    Symptoms  None    Duration  Continue with 30 min of aerobic exercise without signs/symptoms of physical distress.    Intensity  THRR unchanged      Progression    Progression  Continue to progress workloads to maintain intensity without signs/symptoms of physical distress.    Average METs  2.69      Resistance Training   Training Prescription  Yes    Weight  4lbs    Reps  10-15    Time  10 Minutes      Treadmill   MPH  2.3    Grade  2    Minutes  10    METs  3.39      Recumbant Bike   Level  2.5    Watts  25    Minutes  10    METs  2      NuStep   Level  4    SPM  80    Minutes  10    METs  3.39      Home Exercise Plan   Plans to continue exercise at  [employer] (comment) [employer]    Frequency  Add 2 additional days to program exercise sessions.    Initial Home Exercises Provided  08/26/17       Nutrition:  Target Goals: Understanding of nutrition guidelines, daily intake of sodium 1500mg , cholesterol 200mg , calories 30% from fat and 7% or less from saturated fats, daily to have 5 or more servings of fruits and vegetables.  Biometrics: Pre Biometrics - 07/30/17 1652      Pre Biometrics   Height  5' 3.5" (1.613 m)    Weight  172 lb 9.9 oz (78.3 kg)    Waist Circumference  29.5 inches    Hip Circumference  44.5 inches    Waist to Hip Ratio  0.66 %    BMI (Calculated)  30.09    Triceps Skinfold  26 mm    % Body Fat  38.7 %    Grip Strength  25 kg    Flexibility  8.5 in    Single Leg Stand  6 seconds      Post Biometrics - 07/30/17 1527       Post  Biometrics   Height  5' 3.5" (1.613 m)    Weight  172 lb 9.9 oz (78.3 kg)    Waist Circumference  29.5 inches    Hip Circumference  44.5 inches    Waist to Hip Ratio  0.66 %    BMI (Calculated)  30.09    Triceps Skinfold  26 mm    % Body Fat  38.7 %    Grip Strength  25 kg    Flexibility  8.5 in    Single Leg Stand  6 seconds       Nutrition Therapy Plan and Nutrition  Goals: Nutrition Therapy & Goals - 07/30/17 1447      Nutrition Therapy   Diet  Heart Healthy      Personal Nutrition Goals   Nutrition Goal  Pt to identify food quantities  necessary to achieve weight loss of 6-24 lb at graduation from cardiac rehab.       Intervention Plan   Intervention  Prescribe, educate and counsel regarding individualized specific dietary modifications aiming towards targeted core components such as weight, hypertension, lipid management, diabetes, heart failure and other comorbidities.    Expected Outcomes  Short Term Goal: Understand basic principles of dietary content, such as calories, fat, sodium, cholesterol and nutrients.;Long Term Goal: Adherence to prescribed nutrition plan.       Nutrition Assessments: Nutrition Assessments - 07/30/17 1447      MEDFICTS Scores   Pre Score  55       Nutrition Goals Re-Evaluation:   Nutrition Goals Re-Evaluation:   Nutrition Goals Discharge (Final Nutrition Goals Re-Evaluation):   Psychosocial: Target Goals: Acknowledge presence or absence of significant depression and/or stress, maximize coping skills, provide positive support system. Participant is able to verbalize types and ability to use techniques and skills needed for reducing stress and depression.  Initial Review & Psychosocial Screening: Initial Psych Review & Screening - 07/30/17 1432      Initial Review   Current issues with  None Identified      Family Dynamics   Good Support System?  Yes spouse     Comments  no psychosocial needs identified, no interventions necessary       Barriers   Psychosocial barriers to participate in program  There are no identifiable barriers or psychosocial needs.      Screening Interventions   Interventions  Encouraged to exercise;Provide feedback about the scores to participant       Quality of Life Scores: Quality of Life - 07/30/17 1443      Quality of Life Scores   Health/Function Pre  24.13 %    Socioeconomic Pre  26.63 %    Psych/Spiritual Pre  20.21 %    Family Pre  25.2 %    GLOBAL Pre  24.07 %      Scores of 19 and below usually indicate a poorer quality of life in  these areas.  A difference of  2-3 points is a clinically meaningful difference.  A difference of 2-3 points in the total score of the Quality of Life Index has been associated with significant improvement in overall quality of life, self-image, physical symptoms, and general health in studies assessing change in quality of life.  PHQ-9: Recent Review Flowsheet Data    Depression screen [facility] 2/9 08/05/2017 07/11/2017 05/29/2016 12/08/2015 12/08/2015   Decreased Interest 0 0 0 0 0   Down, Depressed, Hopeless 0 1 0 0 0   PHQ - 2 Score 0 1 0 0 0     Interpretation of Total Score  Total Score Depression Severity:  1-4 = Minimal depression, 5-9 = Mild depression, 10-14 = Moderate depression, 15-19 = Moderately severe depression, 20-27 = Severe depression   Psychosocial Evaluation and Intervention:   Psychosocial Re-Evaluation: Psychosocial Re-Evaluation    Row Name 09/05/17 1601 10/01/17 1737           Psychosocial Re-Evaluation   Current issues with  None Identified  None Identified      Interventions  Encouraged to attend Cardiac Rehabilitation for the exercise  Encouraged to attend Cardiac Rehabilitation for the exercise  Continue Psychosocial Services   No Follow up required  No Follow up required         Psychosocial Discharge (Final Psychosocial Re-Evaluation): Psychosocial Re-Evaluation - 10/01/17 1737      Psychosocial Re-Evaluation   Current issues with  None Identified    Interventions  Encouraged to attend Cardiac Rehabilitation for the exercise    Continue Psychosocial Services   No Follow up required       Vocational Rehabilitation: Provide vocational rehab assistance to qualifying candidates.   Vocational Rehab Evaluation & Intervention: Vocational Rehab - 07/30/17 1432      Initial Vocational Rehab Evaluation & Intervention   Assessment shows need for Vocational Rehabilitation  No retired        Education: Education Goals: Education classes will be  provided on a weekly basis, covering required topics. Participant will state understanding/return demonstration of topics presented.  Learning Barriers/Preferences: Learning Barriers/Preferences - 07/30/17 1443      Learning Barriers/Preferences   Learning Barriers  Sight    Learning Preferences  Verbal Instruction;Written Material       Education Topics: Count Your Pulse:  -Group instruction provided by verbal instruction, demonstration, patient participation and written materials to support subject.  Instructors address importance of being able to find your pulse and how to count your pulse when at home without a heart monitor.  Patients get hands on experience counting their pulse with staff help and individually.   Heart Attack, Angina, and Risk Factor Modification:  -Group instruction provided by verbal instruction, video, and written materials to support subject.  Instructors address signs and symptoms of angina and heart attacks.    Also discuss risk factors for heart disease and how to make changes to improve heart health risk factors.   CARDIAC REHAB PHASE II EXERCISE from 09/25/2017 in [place]  Date  09/11/17  Instruction Review Code  2- meets goals/outcomes      Functional Fitness:  -Group instruction provided by verbal instruction, demonstration, patient participation, and written materials to support subject.  Instructors address safety measures for doing things around the house.  Discuss how to get up and down off the floor, how to pick things up properly, how to safely get out of a chair without assistance, and balance training.   CARDIAC REHAB PHASE II EXERCISE from 09/25/2017 in [place]  Date  09/06/17  Instruction Review Code  2- meets goals/outcomes      Meditation and Mindfulness:  -Group instruction provided by verbal instruction, patient participation, and written materials to support subject.   Instructor addresses importance of mindfulness and meditation practice to help reduce stress and improve awareness.  Instructor also leads participants through a meditation exercise.    Stretching for Flexibility and Mobility:  -Group instruction provided by verbal instruction, patient participation, and written materials to support subject.  Instructors lead participants through series of stretches that are designed to increase flexibility thus improving mobility.  These stretches are additional exercise for major muscle groups that are typically performed during regular warm up and cool down.   CARDIAC REHAB PHASE II EXERCISE from 09/25/2017 in [place]  Date  09/13/17  Instruction Review Code  2- meets goals/outcomes      Hands Only CPR:  -Group verbal, video, and participation provides a basic overview of AHA guidelines for community CPR. Role-play of emergencies allow participants the opportunity to practice calling for help and chest compression technique  with discussion of AED use.   Hypertension: -Group verbal and written instruction that provides a basic overview of hypertension including the most recent diagnostic guidelines, risk factor reduction with self-care instructions and medication management.    Nutrition I class: Heart Healthy Eating:  -Group instruction provided by PowerPoint slides, verbal discussion, and written materials to support subject matter. The instructor gives an explanation and review of the Therapeutic Lifestyle Changes diet recommendations, which includes a discussion on lipid goals, dietary fat, sodium, fiber, plant stanol/sterol esters, sugar, and the components of a well-balanced, healthy diet.   CARDIAC REHAB PHASE II EXERCISE from 09/25/2017 in [place]  Date  09/11/17  Educator  RD  Instruction Review Code  Not applicable      Nutrition II class: Lifestyle Skills:  -Group  instruction provided by PowerPoint slides, verbal discussion, and written materials to support subject matter. The instructor gives an explanation and review of label reading, grocery shopping for heart health, heart healthy recipe modifications, and ways to make healthier choices when eating out.   CARDIAC REHAB PHASE II EXERCISE from 09/25/2017 in [place]  Date  09/11/17  Educator  RD  Instruction Review Code  Not applicable      Diabetes Question & Answer:  -Group instruction provided by PowerPoint slides, verbal discussion, and written materials to support subject matter. The instructor gives an explanation and review of diabetes co-morbidities, pre- and post-prandial blood glucose goals, pre-exercise blood glucose goals, signs, symptoms, and treatment of hypoglycemia and hyperglycemia, and foot care basics.   Diabetes Blitz:  -Group instruction provided by PowerPoint slides, verbal discussion, and written materials to support subject matter. The instructor gives an explanation and review of the physiology behind type 1 and type 2 diabetes, diabetes medications and rational behind using different medications, pre- and post-prandial blood glucose recommendations and Hemoglobin A1c goals, diabetes diet, and exercise including blood glucose guidelines for exercising safely.    Portion Distortion:  -Group instruction provided by PowerPoint slides, verbal discussion, written materials, and food models to support subject matter. The instructor gives an explanation of serving size versus portion size, changes in portions sizes over the last 20 years, and what consists of a serving from each food group.   CARDIAC REHAB PHASE II EXERCISE from 09/25/2017 in [place]  Date  08/28/17  Educator  RD  Instruction Review Code  2- meets goals/outcomes      Stress Management:  -Group instruction provided by verbal instruction, video, and  written materials to support subject matter.  Instructors review role of stress in heart disease and how to cope with stress positively.     CARDIAC REHAB PHASE II EXERCISE from 09/25/2017 in [place]  Date  09/04/17  Instruction Review Code  2- meets goals/outcomes      Exercising on Your Own:  -Group instruction provided by verbal instruction, power point, and written materials to support subject.  Instructors discuss benefits of exercise, components of exercise, frequency and intensity of exercise, and end points for exercise.  Also discuss use of nitroglycerin and activating EMS.  Review options of places to exercise outside of rehab.  Review guidelines for sex with heart disease.   CARDIAC REHAB PHASE II EXERCISE from 09/25/2017 in [place]  Date  09/25/17  Educator  EP  Instruction Review Code  2- meets goals/outcomes      Cardiac Drugs  I:  -Group instruction provided by verbal instruction and written materials to support subject.  Instructor reviews cardiac drug classes: antiplatelets, anticoagulants, beta blockers, and statins.  Instructor discusses reasons, side effects, and lifestyle considerations for each drug class.   CARDIAC REHAB PHASE II EXERCISE from 09/25/2017 in [place]  Date  09/18/17  Educator  Pharmacy  Instruction Review Code  2- meets goals/outcomes      Cardiac Drugs II:  -Group instruction provided by verbal instruction and written materials to support subject.  Instructor reviews cardiac drug classes: angiotensin converting enzyme inhibitors (ACE-I), angiotensin II receptor blockers (ARBs), nitrates, and calcium channel blockers.  Instructor discusses reasons, side effects, and lifestyle considerations for each drug class.   Anatomy and Physiology of the Circulatory System:  Group verbal and written instruction and models provide basic cardiac anatomy and  physiology, with the coronary electrical and arterial systems. Review of: AMI, Angina, Valve disease, Heart Failure, Peripheral Artery Disease, Cardiac Arrhythmia, Pacemakers, and the ICD.   CARDIAC REHAB PHASE II EXERCISE from 09/25/2017 in [place]  Date  08/07/17  Instruction Review Code  2- meets goals/outcomes      Other Education:  -Group or individual verbal, written, or video instructions that support the educational goals of the cardiac rehab program.   Knowledge Questionnaire Score: Knowledge Questionnaire Score - 07/30/17 1442      Knowledge Questionnaire Score   Pre Score  19/24       Core Components/Risk Factors/Patient Goals at Admission: Personal Goals and Risk Factors at Admission - 07/30/17 1525      Core Components/Risk Factors/Patient Goals on Admission    Weight Management  Yes;Obesity;Weight Maintenance;Weight Loss    Intervention  Weight Management: Develop a combined nutrition and exercise program designed to reach desired caloric intake, while maintaining appropriate intake of nutrient and fiber, sodium and fats, and appropriate energy expenditure required for the weight goal.;Weight Management: Provide education and appropriate resources to help participant work on and attain dietary goals.;Weight Management/Obesity: Establish reasonable short term and long term weight goals.;Obesity: Provide education and appropriate resources to help participant work on and attain dietary goals.    Admit Weight  172 lb 9.9 oz (78.3 kg)    Goal Weight: Short Term  168 lb (76.2 kg)    Goal Weight: Long Term  162 lb (73.5 kg)    Expected Outcomes  Short Term: Continue to assess and modify interventions until short term weight is achieved;Long Term: Adherence to nutrition and physical activity/exercise program aimed toward attainment of established weight goal;Weight Maintenance: Understanding of the daily nutrition guidelines, which includes 25-35%  calories from fat, 7% or less cal from saturated fats, less than 200mg  cholesterol, less than 1.5gm of sodium, & 5 or more servings of fruits and vegetables daily;Weight Loss: Understanding of general recommendations for a balanced deficit meal plan, which promotes 1-2 lb weight loss per week and includes a negative energy balance of [phone] kcal/d;Understanding recommendations for meals to include 15-35% energy as protein, 25-35% energy from fat, 35-60% energy from carbohydrates, less than 200mg  of dietary cholesterol, 20-35 gm of total fiber daily;Understanding of distribution of calorie intake throughout the day with the consumption of 4-5 meals/snacks    Hypertension  Yes    Intervention  Provide education on lifestyle modifcations including regular physical activity/exercise, weight management, moderate sodium restriction and increased consumption of fresh fruit, vegetables, and low fat dairy, alcohol moderation, and smoking cessation.;Monitor prescription use compliance.  Expected Outcomes  Short Term: Continued assessment and intervention until BP is < 140/30mm HG in hypertensive participants. < 130/59mm HG in hypertensive participants with diabetes, heart failure or chronic kidney disease.;Long Term: Maintenance of blood pressure at goal levels.    Stress  Yes    Intervention  Offer individual and/or small group education and counseling on adjustment to heart disease, stress management and health-related lifestyle change. Teach and support self-help strategies.;Refer participants experiencing significant psychosocial distress to appropriate mental health specialists for further evaluation and treatment. When possible, include family members and significant others in education/counseling sessions.    Expected Outcomes  Short Term: Participant demonstrates changes in health-related behavior, relaxation and other stress management skills, ability to obtain effective social support, and compliance with  psychotropic medications if prescribed.;Long Term: Emotional wellbeing is indicated by absence of clinically significant psychosocial distress or social isolation.       Core Components/Risk Factors/Patient Goals Review:  Goals and Risk Factor Review    Row Name 09/05/17 1559 10/01/17 1737           Core Components/Risk Factors/Patient Goals Review   Personal Goals Review  Weight Management/Obesity;Lipids;Hypertension  Weight Management/Obesity;Lipids;Hypertension      Review  Corky's vital signs have been stable at cardiac rehab. Corky has been doing well with exercise.  Corky's vital signs have been stable at cardiac rehab. Corky has been doing well with exercise.      Expected Outcomes  [place]y will continue to participate in phase 2 cardiac rehab. Corky will continue to take her medicaitons as presribed  Corky will continue to participate in phase 2 cardiac rehab. Corky will continue to take her medicaitons as presribed         Core Components/Risk Factors/Patient Goals at Discharge (Final Review):  Goals and Risk Factor Review - 10/01/17 1737      Core Components/Risk Factors/Patient Goals Review   Personal Goals Review  Weight Management/Obesity;Lipids;Hypertension    Review  Corky's vital signs have been stable at cardiac rehab. Corky has been doing well with exercise.    Expected Outcomes  Corky will continue to participate in phase 2 cardiac rehab. Corky will continue to take her medicaitons as presribed       ITP Comments: ITP Comments    Row Name 07/30/17 1431 08/09/17 1419 09/05/17 1559 10/01/17 1737     ITP Comments  Dr. Fransico Him, Medical Director  30 Day ITP Review, Patient with good partcipation and good attendance in cardiac rehab  30 Day ITP Review, Patient with good partcipation and good attendance in cardiac rehab  30 Day ITP Review, Patient with good partcipation and good attendance in cardiac rehab       Comments: See ITP comments.Barnet Pall,  RN,BSN 10/02/2017 4:29 PM

## 2017-10-02 ENCOUNTER — Encounter (HOSPITAL_COMMUNITY)
Admission: RE | Admit: 2017-10-02 | Discharge: 2017-10-02 | Disposition: A | Discharge status: Home / Self Care | Payer: Medicare Other | Source: Ambulatory Visit | Attending: Interventional Cardiology | Admitting: Interventional Cardiology

## 2017-10-02 DIAGNOSIS — I2510 Atherosclerotic heart disease of native coronary artery without angina pectoris: Secondary | ICD-10-CM | POA: Diagnosis not present

## 2017-10-02 DIAGNOSIS — Z86010 Personal history of colonic polyps: Secondary | ICD-10-CM | POA: Diagnosis not present

## 2017-10-02 DIAGNOSIS — Z87891 Personal history of nicotine dependence: Secondary | ICD-10-CM | POA: Diagnosis not present

## 2017-10-02 DIAGNOSIS — K589 Irritable bowel syndrome without diarrhea: Secondary | ICD-10-CM | POA: Diagnosis not present

## 2017-10-02 DIAGNOSIS — I10 Essential (primary) hypertension: Secondary | ICD-10-CM | POA: Diagnosis not present

## 2017-10-02 DIAGNOSIS — M8580 Other specified disorders of bone density and structure, unspecified site: Secondary | ICD-10-CM | POA: Diagnosis not present

## 2017-10-02 DIAGNOSIS — Z7982 Long term (current) use of aspirin: Secondary | ICD-10-CM | POA: Diagnosis not present

## 2017-10-02 DIAGNOSIS — Z79899 Other long term (current) drug therapy: Secondary | ICD-10-CM | POA: Diagnosis not present

## 2017-10-02 DIAGNOSIS — M1990 Unspecified osteoarthritis, unspecified site: Secondary | ICD-10-CM | POA: Diagnosis not present

## 2017-10-02 DIAGNOSIS — I4891 Unspecified atrial fibrillation: Secondary | ICD-10-CM | POA: Diagnosis not present

## 2017-10-02 DIAGNOSIS — Z955 Presence of coronary angioplasty implant and graft: Secondary | ICD-10-CM

## 2017-10-02 DIAGNOSIS — Z7901 Long term (current) use of anticoagulants: Secondary | ICD-10-CM | POA: Diagnosis not present

## 2017-10-02 DIAGNOSIS — Z7902 Long term (current) use of antithrombotics/antiplatelets: Secondary | ICD-10-CM | POA: Diagnosis not present

## 2017-10-02 DIAGNOSIS — K219 Gastro-esophageal reflux disease without esophagitis: Secondary | ICD-10-CM | POA: Diagnosis not present

## 2017-10-02 DIAGNOSIS — M419 Scoliosis, unspecified: Secondary | ICD-10-CM | POA: Diagnosis not present

## 2017-10-04 ENCOUNTER — Encounter (HOSPITAL_COMMUNITY)
Admission: RE | Admit: 2017-10-04 | Discharge: 2017-10-04 | Disposition: A | Discharge status: Home / Self Care | Payer: Medicare Other | Source: Ambulatory Visit | Attending: Interventional Cardiology | Admitting: Interventional Cardiology

## 2017-10-04 DIAGNOSIS — I4891 Unspecified atrial fibrillation: Secondary | ICD-10-CM | POA: Diagnosis not present

## 2017-10-04 DIAGNOSIS — Z86010 Personal history of colonic polyps: Secondary | ICD-10-CM | POA: Diagnosis not present

## 2017-10-04 DIAGNOSIS — Z79899 Other long term (current) drug therapy: Secondary | ICD-10-CM | POA: Diagnosis not present

## 2017-10-04 DIAGNOSIS — K219 Gastro-esophageal reflux disease without esophagitis: Secondary | ICD-10-CM | POA: Diagnosis not present

## 2017-10-04 DIAGNOSIS — K589 Irritable bowel syndrome without diarrhea: Secondary | ICD-10-CM | POA: Diagnosis not present

## 2017-10-04 DIAGNOSIS — M1990 Unspecified osteoarthritis, unspecified site: Secondary | ICD-10-CM | POA: Diagnosis not present

## 2017-10-04 DIAGNOSIS — Z87891 Personal history of nicotine dependence: Secondary | ICD-10-CM | POA: Diagnosis not present

## 2017-10-04 DIAGNOSIS — M419 Scoliosis, unspecified: Secondary | ICD-10-CM | POA: Diagnosis not present

## 2017-10-04 DIAGNOSIS — I2510 Atherosclerotic heart disease of native coronary artery without angina pectoris: Secondary | ICD-10-CM | POA: Diagnosis not present

## 2017-10-04 DIAGNOSIS — I10 Essential (primary) hypertension: Secondary | ICD-10-CM | POA: Diagnosis not present

## 2017-10-04 DIAGNOSIS — M8580 Other specified disorders of bone density and structure, unspecified site: Secondary | ICD-10-CM | POA: Diagnosis not present

## 2017-10-04 DIAGNOSIS — Z7982 Long term (current) use of aspirin: Secondary | ICD-10-CM | POA: Diagnosis not present

## 2017-10-04 DIAGNOSIS — Z955 Presence of coronary angioplasty implant and graft: Secondary | ICD-10-CM | POA: Diagnosis not present

## 2017-10-04 DIAGNOSIS — Z7902 Long term (current) use of antithrombotics/antiplatelets: Secondary | ICD-10-CM | POA: Diagnosis not present

## 2017-10-04 DIAGNOSIS — Z7901 Long term (current) use of anticoagulants: Secondary | ICD-10-CM | POA: Diagnosis not present

## 2017-10-07 ENCOUNTER — Telehealth: Payer: Self-pay | Admitting: Cardiology

## 2017-10-07 ENCOUNTER — Encounter (HOSPITAL_COMMUNITY)
Admission: RE | Admit: 2017-10-07 | Discharge: 2017-10-07 | Disposition: A | Discharge status: Home / Self Care | Payer: Medicare Other | Source: Ambulatory Visit | Attending: Interventional Cardiology | Admitting: Interventional Cardiology

## 2017-10-07 DIAGNOSIS — M8580 Other specified disorders of bone density and structure, unspecified site: Secondary | ICD-10-CM | POA: Diagnosis not present

## 2017-10-07 DIAGNOSIS — I2510 Atherosclerotic heart disease of native coronary artery without angina pectoris: Secondary | ICD-10-CM | POA: Diagnosis not present

## 2017-10-07 DIAGNOSIS — I4891 Unspecified atrial fibrillation: Secondary | ICD-10-CM | POA: Diagnosis not present

## 2017-10-07 DIAGNOSIS — M419 Scoliosis, unspecified: Secondary | ICD-10-CM | POA: Diagnosis not present

## 2017-10-07 DIAGNOSIS — Z86010 Personal history of colonic polyps: Secondary | ICD-10-CM | POA: Diagnosis not present

## 2017-10-07 DIAGNOSIS — Z955 Presence of coronary angioplasty implant and graft: Secondary | ICD-10-CM

## 2017-10-07 DIAGNOSIS — I10 Essential (primary) hypertension: Secondary | ICD-10-CM | POA: Diagnosis not present

## 2017-10-07 DIAGNOSIS — Z79899 Other long term (current) drug therapy: Secondary | ICD-10-CM | POA: Diagnosis not present

## 2017-10-07 DIAGNOSIS — Z7982 Long term (current) use of aspirin: Secondary | ICD-10-CM | POA: Diagnosis not present

## 2017-10-07 DIAGNOSIS — Z7901 Long term (current) use of anticoagulants: Secondary | ICD-10-CM | POA: Diagnosis not present

## 2017-10-07 DIAGNOSIS — Z87891 Personal history of nicotine dependence: Secondary | ICD-10-CM | POA: Diagnosis not present

## 2017-10-07 DIAGNOSIS — Z7902 Long term (current) use of antithrombotics/antiplatelets: Secondary | ICD-10-CM | POA: Diagnosis not present

## 2017-10-07 DIAGNOSIS — K589 Irritable bowel syndrome without diarrhea: Secondary | ICD-10-CM | POA: Diagnosis not present

## 2017-10-07 DIAGNOSIS — K219 Gastro-esophageal reflux disease without esophagitis: Secondary | ICD-10-CM | POA: Diagnosis not present

## 2017-10-07 DIAGNOSIS — M1990 Unspecified osteoarthritis, unspecified site: Secondary | ICD-10-CM | POA: Diagnosis not present

## 2017-10-07 NOTE — Telephone Encounter (Signed)
Called by RN in Cardiac Rehab- pt is in AF with VR 75, on Eliquis, asymptomatic. I suggested we get her into the office this week, she is going to [place] Thursday.  Kerin Ransom PA-C 10/07/2017 12:06 PM

## 2017-10-07 NOTE — Progress Notes (Signed)
Hailey [name] is noted to be in Atrial fibrillation at a controlled rate resting heart rate 70's to 80's. Blood pressure 122/70. Oxygen saturation 98% on room air. Patient asymptomatic. Hailey [name] is taking her medications as prescribed. Kerin Ransom PA called and notified. [name] said that he will make an appointment for [facility] to be seen in the office as she is going to [place] on Thursday and will not be returning to cardiac rehab until February 10. [name] said that [place] can continue exercise at cardiac rehab as lon as she feels okay.Will fax exercise flow sheets to Dr. Thompson Caul  office for review with today's ECG tracings.Barnet Pall, RN,BSN 10/07/2017 12:31 PM

## 2017-10-09 ENCOUNTER — Encounter (HOSPITAL_COMMUNITY): Payer: Medicare Other

## 2017-10-11 ENCOUNTER — Encounter (HOSPITAL_COMMUNITY): Payer: Medicare Other

## 2017-10-14 ENCOUNTER — Encounter (HOSPITAL_COMMUNITY)
Admission: RE | Admit: 2017-10-14 | Discharge: 2017-10-14 | Disposition: A | Discharge status: Home / Self Care | Payer: Medicare Other | Source: Ambulatory Visit | Attending: Interventional Cardiology | Admitting: Interventional Cardiology

## 2017-10-14 DIAGNOSIS — M1990 Unspecified osteoarthritis, unspecified site: Secondary | ICD-10-CM | POA: Insufficient documentation

## 2017-10-14 DIAGNOSIS — Z79899 Other long term (current) drug therapy: Secondary | ICD-10-CM | POA: Insufficient documentation

## 2017-10-14 DIAGNOSIS — I4891 Unspecified atrial fibrillation: Secondary | ICD-10-CM | POA: Insufficient documentation

## 2017-10-14 DIAGNOSIS — M8580 Other specified disorders of bone density and structure, unspecified site: Secondary | ICD-10-CM | POA: Insufficient documentation

## 2017-10-14 DIAGNOSIS — Z955 Presence of coronary angioplasty implant and graft: Secondary | ICD-10-CM | POA: Insufficient documentation

## 2017-10-14 DIAGNOSIS — Z7902 Long term (current) use of antithrombotics/antiplatelets: Secondary | ICD-10-CM | POA: Insufficient documentation

## 2017-10-14 DIAGNOSIS — F419 Anxiety disorder, unspecified: Secondary | ICD-10-CM | POA: Insufficient documentation

## 2017-10-14 DIAGNOSIS — Z86010 Personal history of colonic polyps: Secondary | ICD-10-CM | POA: Insufficient documentation

## 2017-10-14 DIAGNOSIS — Z7982 Long term (current) use of aspirin: Secondary | ICD-10-CM | POA: Insufficient documentation

## 2017-10-14 DIAGNOSIS — M419 Scoliosis, unspecified: Secondary | ICD-10-CM | POA: Insufficient documentation

## 2017-10-14 DIAGNOSIS — I10 Essential (primary) hypertension: Secondary | ICD-10-CM | POA: Insufficient documentation

## 2017-10-14 DIAGNOSIS — K219 Gastro-esophageal reflux disease without esophagitis: Secondary | ICD-10-CM | POA: Insufficient documentation

## 2017-10-14 DIAGNOSIS — Z7901 Long term (current) use of anticoagulants: Secondary | ICD-10-CM | POA: Insufficient documentation

## 2017-10-14 DIAGNOSIS — K589 Irritable bowel syndrome without diarrhea: Secondary | ICD-10-CM | POA: Insufficient documentation

## 2017-10-14 DIAGNOSIS — I2510 Atherosclerotic heart disease of native coronary artery without angina pectoris: Secondary | ICD-10-CM | POA: Insufficient documentation

## 2017-10-14 DIAGNOSIS — Z87891 Personal history of nicotine dependence: Secondary | ICD-10-CM | POA: Insufficient documentation

## 2017-10-16 ENCOUNTER — Encounter (HOSPITAL_COMMUNITY): Payer: Medicare Other

## 2017-10-18 ENCOUNTER — Encounter (HOSPITAL_COMMUNITY): Payer: Medicare Other

## 2017-10-21 ENCOUNTER — Encounter (HOSPITAL_COMMUNITY): Payer: Medicare Other

## 2017-10-23 ENCOUNTER — Encounter (HOSPITAL_COMMUNITY): Payer: Medicare Other

## 2017-10-25 ENCOUNTER — Encounter (HOSPITAL_COMMUNITY): Payer: Medicare Other

## 2017-10-31 ENCOUNTER — Encounter (HOSPITAL_COMMUNITY): Payer: Self-pay | Admitting: *Deleted

## 2017-10-31 ENCOUNTER — Telehealth (HOSPITAL_COMMUNITY): Payer: Self-pay | Admitting: *Deleted

## 2017-10-31 DIAGNOSIS — Z955 Presence of coronary angioplasty implant and graft: Secondary | ICD-10-CM

## 2017-10-31 NOTE — Progress Notes (Signed)
Cardiac Individual Treatment Plan  Patient Details  Name: Hailey [name] MRN: [id number] Date of Birth: Apr 20, 1935 Referring Provider:     CARDIAC REHAB PHASE II ORIENTATION from 07/30/2017 in [place]  Referring Provider  Daneen Schick MD      Initial Encounter Date:    CARDIAC REHAB PHASE II ORIENTATION from 07/30/2017 in [place]  Date  07/30/17  Referring Provider  Daneen Schick MD      Visit Diagnosis: Stented coronary artery 07/01/17 DES RCA  Patient's Home Medications on Admission:  Current Outpatient Medications:  .  apixaban (ELIQUIS) 5 MG TABS tablet, Take 5 mg by mouth 2 (two) times daily., Disp: , Rfl:  .  atorvastatin (LIPITOR) 80 MG tablet, Take 1 tablet (80 mg total) by mouth daily at 6 PM., Disp: 90 tablet, Rfl: 1 .  cetirizine (ZYRTEC) 10 MG tablet, Take 10 mg by mouth daily as needed for allergies or rhinitis. , Disp: , Rfl:  .  clobetasol cream (TEMOVATE) 0.05 %, APPLY NIGHTLY AS DIRECTED. (Patient taking differently: Apply 1 application topically daily as needed (irritation). APPLY NIGHTLY AS DIRECTED.), Disp: 30 g, Rfl: 2 .  Clobetasol Prop Emollient Base (TEMOVATE E) 0.05 % emollient cream, Apply 1 application topically at bedtime as needed (skin irritation)., Disp: , Rfl:  .  clopidogrel (PLAVIX) 75 MG tablet, Take 1 tablet (75 mg total) by mouth daily., Disp: 30 tablet, Rfl: 11 .  diclofenac sodium (VOLTAREN) 1 % GEL, APPLY 2 GRAMS TOPICALLY 4 TIMES DAILY., Disp: 100 g, Rfl: 3 .  diphenoxylate-atropine (LOMOTIL) 2.5-0.025 MG tablet, Take 1 tablet by mouth 4 (four) times daily as needed for diarrhea or loose stools., Disp: 60 tablet, Rfl: 1 .  DULoxetine (CYMBALTA) 60 MG capsule, Take 60 mg by mouth daily., Disp: , Rfl:  .  ketotifen (ZADITOR) 0.025 % ophthalmic solution, Place 1 drop into both eyes daily as needed (ITCHING)., Disp: , Rfl:  .  linaclotide (LINZESS) 290 MCG CAPS capsule, Take 290  mcg by mouth daily as needed (constipation)., Disp: , Rfl:  .  LORazepam (ATIVAN) 1 MG tablet, Take 1 mg by mouth 2 (two) times daily as needed for anxiety or sleep., Disp: , Rfl:  .  metoprolol succinate (TOPROL-XL) 50 MG 24 hr tablet, Take 1 tablet (50 mg total) by mouth daily. Take with or immediately following a meal., Disp: 90 tablet, Rfl: 3 .  nitroGLYCERIN (NITROSTAT) 0.4 MG SL tablet, Place 0.4 mg under the tongue every 5 (five) minutes as needed for chest pain (Call 911 at 3rd dose within 15 minutes.)., Disp: , Rfl:  .  pantoprazole (PROTONIX) 40 MG tablet, Take 1 tablet (40 mg total) by mouth daily., Disp: 30 tablet, Rfl: 4 .  promethazine (PHENERGAN) 25 MG tablet, Take 1 tablet (25 mg total) by mouth every 8 (eight) hours as needed for nausea or vomiting., Disp: 30 tablet, Rfl: 0 .  sodium chloride (OCEAN) 0.65 % SOLN nasal spray, Place 1 spray into both nostrils 2 (two) times daily as needed for congestion., Disp: 15 mL, Rfl: 0  Past Medical History: Past Medical History:  Diagnosis Date  . Allergic rhinitis   . Anxiety   . Arthritis    "my whole spine" (07/01/2017)  . Atrial fibrillation ([place])   . Coronary artery disease    10/18 PCI/DES to p/m LCx with cutting balloon to mLcx  . Diverticulosis of colon   . GERD (gastroesophageal reflux disease)   .  Hip bursitis 2010   Dr Para March, Post op seroma  . History of colon polyps   . HTN (hypertension)   . IBS (irritable bowel syndrome)    constipation predominant - Dr Earlean Shawl  . Lichen sclerosus   . Osteopenia 11/2016   T score -2.0 FRAX 15%/4.3%  . PAC (premature atrial contraction)    Symptomatiic  . Scoliosis   . SVT (supraventricular tachycardia) ([place])    brief history    Tobacco Use: Social History   Tobacco Use  Smoking Status Former Smoker  . Packs/day: 0.25  . Years: 28.00  . Pack years: 7.00  . Types: Cigarettes  . Last attempt to quit: 1981  . Years since quitting: 38.1  Smokeless Tobacco Never Used     Labs: Recent Review Flowsheet Data    Labs for ITP Cardiac and Pulmonary Rehab Latest Ref Rng & Units 10/31/2011 06/29/2013 09/01/2013 07/04/2014 11/26/2016   Cholestrol 100 - 199 mg/dL 173 208(H) - 153 171   LDLCALC 0 - 99 mg/dL 60 - - 65 61   LDLDIRECT mg/dL - 89.1 - - -   HDL >39 mg/dL 89.70 97.00 - 69 90   Trlycerides 0 - 149 mg/dL 118.0 138.0 - 97 99   Hemoglobin A1c <5.7 % - - - 5.6 -   TCO2 0 - 100 mmol/L - - 25 - -      Capillary Blood Glucose: Lab Results  Component Value Date   GLUCAP 87 09/01/2013     Exercise Target Goals:    Exercise Program Goal: Individual exercise prescription set using results from initial 6 min walk test and THRR while considering  patient's activity barriers and safety.   Exercise Prescription Goal: Initial exercise prescription builds to 30-45 minutes a day of aerobic activity, 2-3 days per week.  Home exercise guidelines will be given to patient during program as part of exercise prescription that the participant will acknowledge.  Activity Barriers & Risk Stratification:   6 Minute Walk:   Oxygen Initial Assessment:   Oxygen Re-Evaluation:   Oxygen Discharge (Final Oxygen Re-Evaluation):   Initial Exercise Prescription:   Perform Capillary Blood Glucose checks as needed.  Exercise Prescription Changes:  Exercise Prescription Changes    Row Name 09/18/17 1600 10/02/17 1000 10/07/17 1105         Response to Exercise   Blood Pressure (Admit)  114/70  124/68  120/70     Blood Pressure (Exercise)  118/72  138/88  106/78     Blood Pressure (Exit)  104/70  114/68  124/82     Heart Rate (Admit)  87 bpm  70 bpm  89 bpm     Heart Rate (Exercise)  100 bpm  91 bpm  130 bpm     Heart Rate (Exit)  79 bpm  66 bpm  80 bpm     Rating of Perceived Exertion (Exercise)  12  13  13      Symptoms  None  None  Some Hip Pain 4/10     Duration  Progress to 30 minutes of  aerobic without signs/symptoms of physical distress  Continue  with 30 min of aerobic exercise without signs/symptoms of physical distress.  Continue with 30 min of aerobic exercise without signs/symptoms of physical distress.     Intensity  THRR unchanged  THRR unchanged  THRR unchanged       Progression   Progression  Continue to progress workloads to maintain intensity without signs/symptoms of physical distress.  Continue  to progress workloads to maintain intensity without signs/symptoms of physical distress.  Continue to progress workloads to maintain intensity without signs/symptoms of physical distress.     Average METs  2.35  2.69  2.63       Resistance Training   Training Prescription  No  Yes  No     Weight  -  4lbs  -     Reps  -  10-15  -     Time  -  10 Minutes  -       Interval Training   Interval Training  -  -  No       Treadmill   MPH  -  2.3  2.3     Grade  -  2  2     Minutes  -  10  10     METs  -  3.39  3.39       Recumbant Bike   Level  2.5  2.5  2.5     Watts  25  25  25      Minutes  15  10  10      METs  2.5  2  2.4       NuStep   Level  4  4  4      SPM  80  80  80     Minutes  15  10  10      METs  2.2  3.39  2.1       Home Exercise Plan   Plans to continue exercise at  [facility] (comment) Mining engineer (comment) Mining engineer (comment) [employer] [employer]     Frequency  Add 2 additional days to program exercise sessions.  Add 2 additional days to program exercise sessions.  Add 2 additional days to program exercise sessions.     Initial Home Exercises Provided  08/26/17  08/26/17  08/26/17        Exercise Comments:  Exercise Comments    Row Name 09/18/17 1631 10/02/17 1053 10/28/17 1102       Exercise Comments  Reveiwed METs with pt. Pt voiced understanding. Pt states she is feeling stronger. Will continue to monitor and progress pt.   Pt is responding well to exercise prescription, despite hip limitations. Pt is doing well and feeling stronger. Will  continue to monitor and progress pt.  Unable to review METs and Goals with pt. Pt out due to vacation. Will follow up with pt once she returns to cardiac rehab.        Exercise Goals and Review:   Exercise Goals Re-Evaluation : Exercise Goals Re-Evaluation    Row Name 10/02/17 1058 10/02/17 1100 10/28/17 1102         Exercise Goal Re-Evaluation   Exercise Goals Review  Increase Physical Activity;Able to understand and use rate of perceived exertion (RPE) scale;Knowledge and understanding of Target Heart Rate Range (THRR);Understanding of Exercise Prescription;Increase Strength and Stamina;Able to check pulse independently  -  Increase Physical Activity;Able to understand and use rate of perceived exertion (RPE) scale;Knowledge and understanding of Target Heart Rate Range (THRR);Understanding of Exercise Prescription;Increase Strength and Stamina;Able to check pulse independently     Comments  Pt is progressing well despite hip limitations.   Pt is progressing well despite hip limitations. Pt is feeling stronger and now able to exercise at a level 4 on Nustep, and use 4lbs handweights. Will continue to monitor and progress pt.  Pt out of rehab for 3 weeks due to family vacation. Will follow up with pt once she returns.      Expected Outcomes  -  Pt will continue to improve cardiorespiratory fitness. Pt is planning to add another day with trainer at fitness center. Pt is currently only doing 1 day with trainer.   Will continue to montior and progress pt. Pt will continue to improve cardiorespiratory fitness.          Discharge Exercise Prescription (Final Exercise Prescription Changes): Exercise Prescription Changes - 10/07/17 1105      Response to Exercise   Blood Pressure (Admit)  120/70    Blood Pressure (Exercise)  106/78    Blood Pressure (Exit)  124/82    Heart Rate (Admit)  89 bpm    Heart Rate (Exercise)  130 bpm    Heart Rate (Exit)  80 bpm    Rating of Perceived Exertion  (Exercise)  13    Symptoms  Some Hip Pain 4/10    Duration  Continue with 30 min of aerobic exercise without signs/symptoms of physical distress.    Intensity  THRR unchanged      Progression   Progression  Continue to progress workloads to maintain intensity without signs/symptoms of physical distress.    Average METs  2.63      Resistance Training   Training Prescription  No      Interval Training   Interval Training  No      Treadmill   MPH  2.3    Grade  2    Minutes  10    METs  3.39      Recumbant Bike   Level  2.5    Watts  25    Minutes  10    METs  2.4      NuStep   Level  4    SPM  80    Minutes  10    METs  2.1      Home Exercise Plan   Plans to continue exercise at  [employer] (comment) [employer] [employer]    Frequency  Add 2 additional days to program exercise sessions.    Initial Home Exercises Provided  08/26/17       Nutrition:  Target Goals: Understanding of nutrition guidelines, daily intake of sodium 1500mg , cholesterol 200mg , calories 30% from fat and 7% or less from saturated fats, daily to have 5 or more servings of fruits and vegetables.  Biometrics:    Nutrition Therapy Plan and Nutrition Goals:   Nutrition Assessments:   Nutrition Goals Re-Evaluation:   Nutrition Goals Re-Evaluation:   Nutrition Goals Discharge (Final Nutrition Goals Re-Evaluation):   Psychosocial: Target Goals: Acknowledge presence or absence of significant depression and/or stress, maximize coping skills, provide positive support system. Participant is able to verbalize types and ability to use techniques and skills needed for reducing stress and depression.  Initial Review & Psychosocial Screening:   Quality of Life Scores:  Scores of 19 and below usually indicate a poorer quality of life in these areas.  A difference of  2-3 points is a clinically meaningful difference.  A difference of 2-3 points in the total score of the Quality of Life Index  has been associated with significant improvement in overall quality of life, self-image, physical symptoms, and general health in studies assessing change in quality of life.  PHQ-9: Recent Review Flowsheet Data    Depression screen [facility] 2/9 08/05/2017 07/11/2017 05/29/2016 12/08/2015 12/08/2015  Decreased Interest 0 0 0 0 0   Down, Depressed, Hopeless 0 1 0 0 0   PHQ - 2 Score 0 1 0 0 0     Interpretation of Total Score  Total Score Depression Severity:  1-4 = Minimal depression, 5-9 = Mild depression, 10-14 = Moderate depression, 15-19 = Moderately severe depression, 20-27 = Severe depression   Psychosocial Evaluation and Intervention:   Psychosocial Re-Evaluation: Psychosocial Re-Evaluation    [place] Name 09/05/17 1601 10/01/17 1737 10/31/17 1230         Psychosocial Re-Evaluation   Current issues with  None Identified  None Identified  None Identified     Interventions  Encouraged to attend Cardiac Rehabilitation for the exercise  Encouraged to attend Cardiac Rehabilitation for the exercise  Encouraged to attend Cardiac Rehabilitation for the exercise     Continue Psychosocial Services   No Follow up required  No Follow up required  No Follow up required        Psychosocial Discharge (Final Psychosocial Re-Evaluation): Psychosocial Re-Evaluation - 10/31/17 1230      Psychosocial Re-Evaluation   Current issues with  None Identified    Interventions  Encouraged to attend Cardiac Rehabilitation for the exercise    Continue Psychosocial Services   No Follow up required       Vocational Rehabilitation: Provide vocational rehab assistance to qualifying candidates.   Vocational Rehab Evaluation & Intervention:   Education: Education Goals: Education classes will be provided on a weekly basis, covering required topics. Participant will state understanding/return demonstration of topics presented.  Learning Barriers/Preferences:   Education Topics: Count Your Pulse:  -Group  instruction provided by verbal instruction, demonstration, patient participation and written materials to support subject.  Instructors address importance of being able to find your pulse and how to count your pulse when at home without a heart monitor.  Patients get hands on experience counting their pulse with staff help and individually.   CARDIAC REHAB PHASE II EXERCISE from 10/04/2017 in [place]  Date  10/04/17  Educator  RN  Instruction Review Code (Retired)  2- meets goals/outcomes      Heart Attack, Angina, and Risk Factor Modification:  -Group instruction provided by verbal instruction, video, and written materials to support subject.  Instructors address signs and symptoms of angina and heart attacks.    Also discuss risk factors for heart disease and how to make changes to improve heart health risk factors.   CARDIAC REHAB PHASE II EXERCISE from 10/04/2017 in [place]  Date  09/11/17  Instruction Review Code (Retired)  2- meets goals/outcomes      Functional Fitness:  -Group instruction provided by verbal instruction, demonstration, patient participation, and written materials to support subject.  Instructors address safety measures for doing things around the house.  Discuss how to get up and down off the floor, how to pick things up properly, how to safely get out of a chair without assistance, and balance training.   CARDIAC REHAB PHASE II EXERCISE from 10/04/2017 in [place]  Date  09/06/17  Instruction Review Code (Retired)  2- Media planner and Mindfulness:  -Group instruction provided by verbal instruction, patient participation, and written materials to support subject.  Instructor addresses importance of mindfulness and meditation practice to help reduce stress and improve awareness.  Instructor also leads participants through a meditation exercise.     Stretching  for Flexibility and Mobility:  -Group instruction provided by verbal instruction, patient participation, and written materials to support subject.  Instructors lead participants through series of stretches that are designed to increase flexibility thus improving mobility.  These stretches are additional exercise for major muscle groups that are typically performed during regular warm up and cool down.   CARDIAC REHAB PHASE II EXERCISE from 10/04/2017 in [place]  Date  09/13/17  Instruction Review Code (Retired)  2- meets goals/outcomes      Hands Only CPR:  -Group verbal, video, and participation provides a basic overview of AHA guidelines for community CPR. Role-play of emergencies allow participants the opportunity to practice calling for help and chest compression technique with discussion of AED use.   Hypertension: -Group verbal and written instruction that provides a basic overview of hypertension including the most recent diagnostic guidelines, risk factor reduction with self-care instructions and medication management.    Nutrition I class: Heart Healthy Eating:  -Group instruction provided by PowerPoint slides, verbal discussion, and written materials to support subject matter. The instructor gives an explanation and review of the Therapeutic Lifestyle Changes diet recommendations, which includes a discussion on lipid goals, dietary fat, sodium, fiber, plant stanol/sterol esters, sugar, and the components of a well-balanced, healthy diet.   CARDIAC REHAB PHASE II EXERCISE from 10/04/2017 in [place]  Date  09/11/17  Educator  RD  Instruction Review Code (Retired)  Not applicable      Nutrition II class: Lifestyle Skills:  -Group instruction provided by [employer], verbal discussion, and written materials to support subject matter. The instructor gives an explanation and review of label  reading, grocery shopping for heart health, heart healthy recipe modifications, and ways to make healthier choices when eating out.   CARDIAC REHAB PHASE II EXERCISE from 10/04/2017 in [place]  Date  09/11/17  Educator  RD  Instruction Review Code (Retired)  Not applicable      Diabetes Question & Answer:  -Group instruction provided by PowerPoint slides, verbal discussion, and written materials to support subject matter. The instructor gives an explanation and review of diabetes co-morbidities, pre- and post-prandial blood glucose goals, pre-exercise blood glucose goals, signs, symptoms, and treatment of hypoglycemia and hyperglycemia, and foot care basics.   Diabetes Blitz:  -Group instruction provided by PowerPoint slides, verbal discussion, and written materials to support subject matter. The instructor gives an explanation and review of the physiology behind type 1 and type 2 diabetes, diabetes medications and rational behind using different medications, pre- and post-prandial blood glucose recommendations and Hemoglobin A1c goals, diabetes diet, and exercise including blood glucose guidelines for exercising safely.    Portion Distortion:  -Group instruction provided by PowerPoint slides, verbal discussion, written materials, and food models to support subject matter. The instructor gives an explanation of serving size versus portion size, changes in portions sizes over the last 20 years, and what consists of a serving from each food group.   CARDIAC REHAB PHASE II EXERCISE from 10/04/2017 in [place]  Date  08/28/17  Educator  RD  Instruction Review Code (Retired)  2- meets goals/outcomes      Stress Management:  -Group instruction provided by verbal instruction, video, and written materials to support subject matter.  Instructors review role of stress in heart disease and how to cope with stress positively.      CARDIAC REHAB PHASE II EXERCISE from  10/04/2017 in [place]  Date  09/04/17  Instruction Review Code (Retired)  2- meets goals/outcomes      Exercising on Your Own:  -Group instruction provided by verbal instruction, power point, and written materials to support subject.  Instructors discuss benefits of exercise, components of exercise, frequency and intensity of exercise, and end points for exercise.  Also discuss use of nitroglycerin and activating EMS.  Review options of places to exercise outside of rehab.  Review guidelines for sex with heart disease.   CARDIAC REHAB PHASE II EXERCISE from 10/04/2017 in [place]  Date  09/25/17  Educator  EP  Instruction Review Code (Retired)  2- meets goals/outcomes      Cardiac Drugs I:  -Group instruction provided by verbal instruction and written materials to support subject.  Instructor reviews cardiac drug classes: antiplatelets, anticoagulants, beta blockers, and statins.  Instructor discusses reasons, side effects, and lifestyle considerations for each drug class.   CARDIAC REHAB PHASE II EXERCISE from 10/04/2017 in [place]  Date  09/18/17  Educator  Pharmacy  Instruction Review Code (Retired)  2- meets goals/outcomes      Cardiac Drugs II:  -Group instruction provided by verbal instruction and written materials to support subject.  Instructor reviews cardiac drug classes: angiotensin converting enzyme inhibitors (ACE-I), angiotensin II receptor blockers (ARBs), nitrates, and calcium channel blockers.  Instructor discusses reasons, side effects, and lifestyle considerations for each drug class.   Anatomy and Physiology of the Circulatory System:  Group verbal and written instruction and models provide basic cardiac anatomy and physiology, with the coronary electrical and arterial systems. Review of: AMI, Angina, Valve disease, Heart Failure,  Peripheral Artery Disease, Cardiac Arrhythmia, Pacemakers, and the ICD.   CARDIAC REHAB PHASE II EXERCISE from 10/04/2017 in [place]  Date  08/07/17  Instruction Review Code (Retired)  2- meets goals/outcomes      Other Education:  -Group or individual verbal, written, or video instructions that support the educational goals of the cardiac rehab program.   Holiday Eating Survival Tips:  -Group instruction provided by PowerPoint slides, verbal discussion, and written materials to support subject matter. The instructor gives patients tips, tricks, and techniques to help them not only survive but enjoy the holidays despite the onslaught of food that accompanies the holidays.   Knowledge Questionnaire Score:   Core Components/Risk Factors/Patient Goals at Admission:   Core Components/Risk Factors/Patient Goals Review:  Goals and Risk Factor Review    Row Name 09/05/17 1559 10/01/17 1737 10/31/17 1228         Core Components/Risk Factors/Patient Goals Review   Personal Goals Review  Weight Management/Obesity;Lipids;Hypertension  Weight Management/Obesity;Lipids;Hypertension  Weight Management/Obesity;Lipids;Hypertension     Review  Hailey [name]'s vital signs have been stable at cardiac rehab. Hailey [name] has been doing well with exercise.  Hailey [name]'s vital signs have been stable at cardiac rehab. Hailey [name] has been doing well with exercise.  Hailey [name]'s vital signs have been stable at cardiac rehab. Hailey [name] has been doing well with exercise. Hailey [name] has not attended exercise since 10/07/17 due to vacation     Expected Outcomes  Hailey [name] will continue to participate in phase 2 cardiac rehab. Hailey [name] will continue to take her medicaitons as presribed  Hailey [name] will continue to participate in phase 2 cardiac rehab. Hailey [name] will continue to take her medicaitons as presribed  Hailey [name] will continue to participate in phase 2 cardiac rehab when she returns  from vacation.        Core Components/Risk  Factors/Patient Goals at Discharge (Final Review):  Goals and Risk Factor Review - 10/31/17 1228      Core Components/Risk Factors/Patient Goals Review   Personal Goals Review  Weight Management/Obesity;Lipids;Hypertension    Review  Hailey [name]'s vital signs have been stable at cardiac rehab. Hailey [name] has been doing well with exercise. Hailey [name] has not attended exercise since 10/07/17 due to vacation    Expected Outcomes  Hailey [name] will continue to participate in phase 2 cardiac rehab when she returns from vacation.       ITP Comments: ITP Comments    Row Name 09/05/17 1559 10/01/17 1737 10/31/17 1226       ITP Comments  30 Day ITP Review, Patient with good partcipation and good attendance in cardiac rehab  30 Day ITP Review, Patient with good partcipation and good attendance in cardiac rehab  30 Day ITP Review, Hailey [name]'s last day of was on 10/07/17. Hailey [name] has been on vacation.        Comments: See ITP Comments.Barnet Pall, RN,BSN 10/31/2017 12:34 PM

## 2017-11-04 ENCOUNTER — Encounter (HOSPITAL_COMMUNITY)
Admission: RE | Admit: 2017-11-04 | Discharge: 2017-11-04 | Disposition: A | Discharge status: Home / Self Care | Payer: Medicare Other | Source: Ambulatory Visit | Attending: Interventional Cardiology | Admitting: Interventional Cardiology

## 2017-11-04 ENCOUNTER — Telehealth: Payer: Self-pay | Admitting: Physician Assistant

## 2017-11-04 ENCOUNTER — Ambulatory Visit: Payer: Medicare Other | Admitting: Physician Assistant

## 2017-11-04 ENCOUNTER — Encounter: Payer: Self-pay | Admitting: Physician Assistant

## 2017-11-04 VITALS — BP 124/80 | HR 100 | Ht 64.0 in | Wt 171.0 lb

## 2017-11-04 DIAGNOSIS — Z7982 Long term (current) use of aspirin: Secondary | ICD-10-CM | POA: Diagnosis not present

## 2017-11-04 DIAGNOSIS — I10 Essential (primary) hypertension: Secondary | ICD-10-CM | POA: Diagnosis not present

## 2017-11-04 DIAGNOSIS — M1990 Unspecified osteoarthritis, unspecified site: Secondary | ICD-10-CM | POA: Diagnosis not present

## 2017-11-04 DIAGNOSIS — I480 Paroxysmal atrial fibrillation: Secondary | ICD-10-CM | POA: Diagnosis not present

## 2017-11-04 DIAGNOSIS — Z7901 Long term (current) use of anticoagulants: Secondary | ICD-10-CM | POA: Diagnosis not present

## 2017-11-04 DIAGNOSIS — Z87891 Personal history of nicotine dependence: Secondary | ICD-10-CM | POA: Diagnosis not present

## 2017-11-04 DIAGNOSIS — F419 Anxiety disorder, unspecified: Secondary | ICD-10-CM | POA: Diagnosis not present

## 2017-11-04 DIAGNOSIS — Z955 Presence of coronary angioplasty implant and graft: Secondary | ICD-10-CM | POA: Diagnosis not present

## 2017-11-04 DIAGNOSIS — I25119 Atherosclerotic heart disease of native coronary artery with unspecified angina pectoris: Secondary | ICD-10-CM

## 2017-11-04 DIAGNOSIS — M8580 Other specified disorders of bone density and structure, unspecified site: Secondary | ICD-10-CM | POA: Diagnosis not present

## 2017-11-04 DIAGNOSIS — E785 Hyperlipidemia, unspecified: Secondary | ICD-10-CM | POA: Diagnosis not present

## 2017-11-04 DIAGNOSIS — Z86010 Personal history of colonic polyps: Secondary | ICD-10-CM | POA: Diagnosis not present

## 2017-11-04 DIAGNOSIS — M419 Scoliosis, unspecified: Secondary | ICD-10-CM | POA: Diagnosis not present

## 2017-11-04 DIAGNOSIS — I2510 Atherosclerotic heart disease of native coronary artery without angina pectoris: Secondary | ICD-10-CM | POA: Diagnosis not present

## 2017-11-04 DIAGNOSIS — K589 Irritable bowel syndrome without diarrhea: Secondary | ICD-10-CM | POA: Diagnosis not present

## 2017-11-04 DIAGNOSIS — Z79899 Other long term (current) drug therapy: Secondary | ICD-10-CM | POA: Diagnosis not present

## 2017-11-04 DIAGNOSIS — K219 Gastro-esophageal reflux disease without esophagitis: Secondary | ICD-10-CM | POA: Diagnosis not present

## 2017-11-04 DIAGNOSIS — I4891 Unspecified atrial fibrillation: Secondary | ICD-10-CM | POA: Diagnosis not present

## 2017-11-04 DIAGNOSIS — Z7902 Long term (current) use of antithrombotics/antiplatelets: Secondary | ICD-10-CM | POA: Diagnosis not present

## 2017-11-04 NOTE — Patient Instructions (Addendum)
Medication Instructions:  Your physician recommends that you continue on your current medications as directed. Please refer to the Current Medication list given to you today.   Labwork: None ordered  Testing/Procedures: None ordered  Follow-Up: Your physician wants you to keep your follow-up appointment with Dr. Tamala Julian on 12/12/17 at 10:20 AM.  Any Other Special Instructions Will Be Listed Below (If Applicable).     If you need a refill on your cardiac medications before your next appointment, please call your pharmacy.

## 2017-11-04 NOTE — Progress Notes (Signed)
Hailey [name] returned to exercise today after being on vacation and appears to still be in Atrial fibrillation at a controlled rate. Resting rate 84. Blood pressure within normal limits. Sharrell Ku PA called and notified. Hinton Dyer made an appointment for Hailey [name] to follow up with Vin Bhagat today at 2:30. Patient was given a copy of today's ECG tracings to take to dr [facility] office for review.Barnet Pall, RN,BSN 11/04/2017 4:43 PM

## 2017-11-04 NOTE — Telephone Encounter (Signed)
Cardiac rehab nurse called - noting patient is still in AF. Has h/o PAF, irregular rhythm by exam 08/2017 but was normal sinus in 07/2017. Just got back from [place] (see phone note 10/07/17 [name] had suggested OV but she was going to be out of town). Pt generally unbothered with any symptoms. Scheduled to see Vin Bhagat PA-C today at 2:30pm, asked her to arrive by 2:15. Cardiac rehab nurse will send with strips. Dayna Dunn PA-C

## 2017-11-04 NOTE — Progress Notes (Signed)
Cardiology Office Note    Date:  11/04/2017   ID:  Hailey [name], DOB October 22, 1934, MRN [id number]  PCP:  Cassandria Anger, MD  Cardiologist:  Dr. Tamala Julian   Chief Complaint: Afib   History of Present Illness:   Hailey [name] is a 83 y.o. female with hx of history ofPSVT, paroxysmal atrial fibrillationonchronic anticoagulation, CAD with RCA stent after atherectomy 06/2017, and hypertension send from cardiac rehab for afib evaluation.  In 2018, noted extreme fatigue, lightheadedness and weakness. + palpitations. Lots of emotional stress noted with death of her son. Referred on initially for cardioversion - but in light of past abnormal Myoview and coronary calcification seen on prior imaging - cardiac cath was recommended - see below. Now s/p athrectomy of the RCA. Noted 3V CAD.She is on triple therapy anticoagulation for 1 months - then aspirin will be stopped and DAPT will be continued.  Last seen by Dr. Tamala Julian 08/14/18. Complained of lightheadedness. Noted SBP of 100s. Reduced Toprol XL to 50mg  qd.   The patient noted in afib during cardiac rehab and added to my schedule.  Patient bring paperwork from cardiac rehab.  Her rate is fairly controlled at 60-90s.  Goes above 100 with activity.  Patient is completely asymptomatic.  She has chronic dyspnea on climbing.  This has improved significantly  since PCI.  Patient denies any chest pain, palpitation, dizziness, orthopnea, PND, syncope, lower extremity edema, melena or blood in her stool or urine.  Past Medical History:  Diagnosis Date  . Allergic rhinitis   . Anxiety   . Arthritis    "my whole spine" (07/01/2017)  . Atrial fibrillation ([place])   . Coronary artery disease    10/18 PCI/DES to p/m LCx with cutting balloon to mLcx  . Diverticulosis of colon   . GERD (gastroesophageal reflux disease)   . Hip bursitis 2010   Dr Para March, Post op seroma  . History of colon polyps   . HTN (hypertension)   . IBS (irritable bowel  syndrome)    constipation predominant - Dr Earlean Shawl  . Lichen sclerosus   . Osteopenia 11/2016   T score -2.0 FRAX 15%/4.3%  . PAC (premature atrial contraction)    Symptomatiic  . Scoliosis   . SVT (supraventricular tachycardia) ([place])    brief history    Past Surgical History:  Procedure Laterality Date  . ANTERIOR AND POSTERIOR VAGINAL REPAIR  01/2002   Archie Endo 01/23/2011  . APPENDECTOMY  1948  . CARDIAC CATHETERIZATION  06/26/2017  . CORONARY ANGIOPLASTY WITH STENT PLACEMENT  07/01/2017  . CORONARY ATHERECTOMY N/A 07/01/2017   Procedure: CORONARY ATHERECTOMY;  Surgeon: Belva Crome, MD;  Location: [place] [facility];  Service: Cardiovascular;  Laterality: N/A;  . CORONARY STENT INTERVENTION N/A 07/01/2017   Procedure: CORONARY STENT INTERVENTION;  Surgeon: Belva Crome, MD;  Location: [place] [facility];  Service: Cardiovascular;  Laterality: N/A;  . HAMMER TOE SURGERY    . HEMORRHOID BANDING    . HIP SURGERY Left 04/2009   hip examination under anesthesia followed by greater trochanteric bursectomy; iliotibial band tenotomy/notes 01/20/2011  . KNEE BURSECTOMY Right 04/2009   Archie Endo 01/09/2011  . LEFT HEART CATH AND CORONARY ANGIOGRAPHY N/A 06/26/2017   Procedure: LEFT HEART CATH AND CORONARY ANGIOGRAPHY;  Surgeon: Belva Crome, MD;  Location: [place] [facility];  Service: Cardiovascular;  Laterality: N/A;  . PUBOVAGINAL SLING  01/2002   Archie Endo 01/23/2011  . REDUCTION MAMMAPLASTY    . TEMPORARY  PACEMAKER N/A 07/01/2017   Procedure: TEMPORARY PACEMAKER;  Surgeon: Belva Crome, MD;  Location: [place] [facility];  Service: Cardiovascular;  Laterality: N/A;  . VAGINAL HYSTERECTOMY  01/2002   Vaginal hysterectomy, bilateral salpingo-oophorectomy/notes 01/23/2011    Current Medications: Prior to Admission medications   Medication Sig Start Date End Date Taking? Authorizing Provider  apixaban (ELIQUIS) 5 MG TABS tablet Take 5 mg by mouth 2 (two) times daily.    [provider]  atorvastatin (LIPITOR) 80 MG tablet Take 1 tablet (80 mg total) by mouth daily at 6 PM. 07/02/17   Cheryln Manly, NP  cetirizine (ZYRTEC) 10 MG tablet Take 10 mg by mouth daily as needed for allergies or rhinitis.     [provider]  clobetasol cream (TEMOVATE) 0.05 % APPLY NIGHTLY AS DIRECTED. Patient taking differently: Apply 1 application topically daily as needed (irritation). APPLY NIGHTLY AS DIRECTED. 02/06/17   Fontaine, Belinda Block, MD  Clobetasol Prop Emollient Base (TEMOVATE E) 0.05 % emollient cream Apply 1 application topically at bedtime as needed (skin irritation).    [provider]  clopidogrel (PLAVIX) 75 MG tablet Take 1 tablet (75 mg total) by mouth daily. 06/27/17 06/27/18  Belva Crome, MD  diclofenac sodium (VOLTAREN) 1 % GEL APPLY 2 GRAMS TOPICALLY 4 TIMES DAILY. 07/11/17   Plotnikov, Evie Lacks, MD  diphenoxylate-atropine (LOMOTIL) 2.5-0.025 MG tablet Take 1 tablet by mouth 4 (four) times daily as needed for diarrhea or loose stools. 09/13/16   Plotnikov, Evie Lacks, MD  DULoxetine (CYMBALTA) 60 MG capsule Take 60 mg by mouth daily.    [provider]  ketotifen (ZADITOR) 0.025 % ophthalmic solution Place 1 drop into both eyes daily as needed (ITCHING).    [provider]  linaclotide (LINZESS) 290 MCG CAPS capsule Take 290 mcg by mouth daily as needed (constipation).    [provider]  LORazepam (ATIVAN) 1 MG tablet Take 1 mg by mouth 2 (two) times daily as needed for anxiety or sleep.    [provider]  metoprolol succinate (TOPROL-XL) 50 MG 24 hr tablet Take 1 tablet (50 mg total) by mouth daily. Take with or immediately following a meal. 08/14/17 08/09/18  Belva Crome, MD  nitroGLYCERIN (NITROSTAT) 0.4 MG SL tablet Place 0.4 mg under the tongue every 5 (five) minutes as needed for chest pain (Call 911 at 3rd dose within 15 minutes.).    [provider]  pantoprazole (PROTONIX) 40 MG tablet  Take 1 tablet (40 mg total) by mouth daily. 07/02/17   Cheryln Manly, NP  promethazine (PHENERGAN) 25 MG tablet Take 1 tablet (25 mg total) by mouth every 8 (eight) hours as needed for nausea or vomiting. 09/13/16   Plotnikov, Evie Lacks, MD  sodium chloride (OCEAN) 0.65 % SOLN nasal spray Place 1 spray into both nostrils 2 (two) times daily as needed for congestion. 07/06/16   Nche, Charlene Brooke, NP    Allergies:   Macrobid [nitrofurantoin monohyd macro]; Meloxicam; Digoxin and related; and Sulfamethoxazole-trimethoprim   Social History   Socioeconomic History  . Marital status: Married    Spouse name: None  . Number of children: 2  . Years of education: None  . Highest education level: None  Social Needs  . Financial resource strain: None  . Food insecurity - worry: None  . Food insecurity - inability: None  . Transportation needs - medical: None  . Transportation needs - non-medical: None  Occupational History  .  Occupation: Patent attorney: RETIRED  Tobacco Use  . Smoking status: Former Smoker    Packs/day: 0.25    Years: 28.00    Pack years: 7.00    Types: Cigarettes    Last attempt to quit: 1981    Years since quitting: 38.1  . Smokeless tobacco: Never Used  Substance and Sexual Activity  . Alcohol use: Yes    Alcohol/week: 12.6 oz    Types: 7 Standard drinks or equivalent, 14 Shots of liquor per week    Comment: 07/01/2017 "couple shots/day"  . Drug use: No  . Sexual activity: Yes    Birth control/protection: Surgical    Comment: 1st intercourse 83 yo--Fewer than 5 partners  Other Topics Concern  . None  Social History Narrative   Regular Exercise -  YES           Family History:  The patient's family history includes Cancer in her brother, father, and son; Colon cancer in her mother; Diabetes in her father.   ROS:   Please see the history of present illness.    ROS All other systems reviewed and are negative.   PHYSICAL EXAM:   VS:  BP  124/80   Pulse 100   Ht 5\' 4"  (1.626 m)   Wt 171 lb (77.6 kg)   SpO2 95%   BMI 29.35 kg/m    GEN: Well nourished, well developed, in no acute distress  HEENT: normal  Neck: no JVD, carotid bruits, or masses Cardiac: Irregularly irregular; no murmurs, rubs, or gallops,no edema  Respiratory:  clear to auscultation bilaterally, normal work of breathing GI: soft, nontender, nondistended, + BS MS: no deformity or atrophy  Skin: warm and dry, no rash Neuro:  Alert and Oriented x 3, Strength and sensation are intact Psych: euthymic mood, full affect  Wt Readings from Last 3 Encounters:  11/04/17 171 lb (77.6 kg)  08/14/17 169 lb 6.4 oz (76.8 kg)  07/30/17 172 lb 9.9 oz (78.3 kg)      Studies/Labs Reviewed:   EKG:  EKG is not  ordered today.    Recent Labs: 07/09/2017: BUN 24; Creatinine, Ser 1.21; Potassium 4.6; Sodium 135 07/23/2017: Hemoglobin 11.2; Platelets 307   Lipid Panel    Component Value Date/Time   CHOL 171 11/26/2016 0844   TRIG 99 11/26/2016 0844   TRIG 183 (H) 07/26/2006 0903   HDL 90 11/26/2016 0844   CHOLHDL 1.9 11/26/2016 0844   CHOLHDL 2.2 07/04/2014 0528   VLDL 19 07/04/2014 0528   LDLCALC 61 11/26/2016 0844   LDLDIRECT 89.1 06/29/2013 1604    Additional studies/ records that were reviewed today include:   Echocardiogram: 09/16/15 Study Conclusions  - Left ventricle: The cavity size was normal. Wall thickness was   normal. Systolic function was normal. The estimated ejection   fraction was in the range of 55% to 60%. Wall motion was normal;   there were no regional wall motion abnormalities. - Mitral valve: There was mild regurgitation. - Left atrium: The atrium was mildly dilated. - Right atrium: The atrium was mildly dilated. - Tricuspid valve: There was mild-moderate regurgitation directed   centrally. - Pulmonary arteries: Systolic pressure was mildly increased. PA   peak pressure: 38 mm Hg (S).   CORONARY STENT INTERVENTION   07/01/17    CORONARY ATHERECTOMY  TEMPORARY PACEMAKER  Conclusion     Dist RCA lesion, 50 %stenosed.    Successful PCI with Orbital atherectomy followed by stenting of a  heavily calcified right coronary reducing a proximal to mid 85% stenosis to 0% with with TIMI grade 3 flow. An Onyx 4.0 x 26 mm DES was post.-dilated to 4.5 mm in diameter.  RECOMMENDATIONS:   Aspirin, Plavix, and Eliquis for 1 month then drop aspirin.  Plavix and Eliquis x 5 months then drop plavix.  Resume Eliquis in AM 07/02/2017.       ASSESSMENT & PLAN:    1. Persistent atrial fibrilaltion - She was in sinus rhythm per last EKG 07/09/17. Her rate was irregular when last seen by Dr. Tamala Julian 08/14/17 (no EKG).  -Per review of cardiac rehab, it indicates that patient stays in atrial fibrillation at a controlled ventricular rate of 60-90s most of the time.  Increase activity as expected.  Patient is asymptomatic.  Patient has a chronic dyspnea with climbing however, improved significantly after PCI.  She is able to walk on a treadmill on stage II without any issue. -Seems will continue with rate control strategy as patient is asymptomatic.  Continue Toprol-XL 50 mg daily.  Eliquis for anticoagulation.  I will send message to Dr. Tamala Julian to review rate versus rhythm control strategy. - She can resume cardiac reb without any limitations.   2. CAD s/p atherectomy to RCA - Continue plavix (may DC during next office visit, per cath no angina.  Continue report plan to discontinue Plavix after 5 months of PCI).  Continue statin and beta-blocker.  3. HTN -Stable on current medication.  4. HLD - 11/26/2016: Cholesterol, Total 171; HDL 90; LDL Calculated 61; Triglycerides 99  - LDL at goal. Continue statin.   Medication Adjustments/Labs and Tests Ordered: Current medicines are reviewed at length with the patient today.  Concerns regarding medicines are outlined above.  Medication changes, Labs and Tests ordered today are listed  in the Patient Instructions below. Patient Instructions  Medication Instructions:  Your physician recommends that you continue on your current medications as directed. Please refer to the Current Medication list given to you today.   Labwork: None ordered  Testing/Procedures: None ordered  Follow-Up: Your physician wants you to keep your follow-up appointment with Dr. Tamala Julian on 12/12/17 at 10:20 AM.  Any Other Special Instructions Will Be Listed Below (If Applicable).     If you need a refill on your cardiac medications before your next appointment, please call your pharmacy.      Hailey [name], [place]  11/04/2017 3:13 PM    [facility] [address], [place], [place]  [address] Phone: [phone]; Fax: [phone]

## 2017-11-06 ENCOUNTER — Telehealth: Payer: Self-pay | Admitting: *Deleted

## 2017-11-06 ENCOUNTER — Encounter (HOSPITAL_COMMUNITY)
Admission: RE | Admit: 2017-11-06 | Discharge: 2017-11-06 | Disposition: A | Discharge status: Home / Self Care | Payer: Medicare Other | Source: Ambulatory Visit | Attending: Interventional Cardiology | Admitting: Interventional Cardiology

## 2017-11-06 DIAGNOSIS — K589 Irritable bowel syndrome without diarrhea: Secondary | ICD-10-CM | POA: Diagnosis not present

## 2017-11-06 DIAGNOSIS — Z7901 Long term (current) use of anticoagulants: Secondary | ICD-10-CM | POA: Diagnosis not present

## 2017-11-06 DIAGNOSIS — M8580 Other specified disorders of bone density and structure, unspecified site: Secondary | ICD-10-CM | POA: Diagnosis not present

## 2017-11-06 DIAGNOSIS — K219 Gastro-esophageal reflux disease without esophagitis: Secondary | ICD-10-CM | POA: Diagnosis not present

## 2017-11-06 DIAGNOSIS — M1990 Unspecified osteoarthritis, unspecified site: Secondary | ICD-10-CM | POA: Diagnosis not present

## 2017-11-06 DIAGNOSIS — Z955 Presence of coronary angioplasty implant and graft: Secondary | ICD-10-CM | POA: Diagnosis not present

## 2017-11-06 DIAGNOSIS — I10 Essential (primary) hypertension: Secondary | ICD-10-CM | POA: Diagnosis not present

## 2017-11-06 DIAGNOSIS — Z7902 Long term (current) use of antithrombotics/antiplatelets: Secondary | ICD-10-CM | POA: Diagnosis not present

## 2017-11-06 DIAGNOSIS — Z86010 Personal history of colonic polyps: Secondary | ICD-10-CM | POA: Diagnosis not present

## 2017-11-06 DIAGNOSIS — I4891 Unspecified atrial fibrillation: Secondary | ICD-10-CM | POA: Diagnosis not present

## 2017-11-06 DIAGNOSIS — Z7982 Long term (current) use of aspirin: Secondary | ICD-10-CM | POA: Diagnosis not present

## 2017-11-06 DIAGNOSIS — I2510 Atherosclerotic heart disease of native coronary artery without angina pectoris: Secondary | ICD-10-CM | POA: Diagnosis not present

## 2017-11-06 DIAGNOSIS — Z87891 Personal history of nicotine dependence: Secondary | ICD-10-CM | POA: Diagnosis not present

## 2017-11-06 DIAGNOSIS — Z79899 Other long term (current) drug therapy: Secondary | ICD-10-CM | POA: Diagnosis not present

## 2017-11-06 DIAGNOSIS — M419 Scoliosis, unspecified: Secondary | ICD-10-CM | POA: Diagnosis not present

## 2017-11-06 MED ORDER — METOPROLOL SUCCINATE ER 25 MG PO TB24: 75.0000 mg | ORAL_TABLET | Freq: Every day | ORAL | 3 refills | Status: DC

## 2017-11-06 NOTE — Telephone Encounter (Signed)
Editor: Leanor Kail, PA (Physician Assistant)    Please increase Toprol XL to 75mg  qd. Follow up with Dr. Tamala Julian as schedule. Continue cardiac rehab.       Spoke with pt and went over recommendations.  Advised when appropriate to contact office, otherwise we would see her in April.  Pt verbalized understanding and was in agreement with this plan.

## 2017-11-06 NOTE — Telephone Encounter (Signed)
-----   Message from [place], [place] sent at 11/06/2017  9:14 AM EST -----   ----- Message ----- From: Belva Crome, MD Sent: 11/04/2017   4:45 PM To: Bhavinkumar Bhagat, PA  Rate control if she feels okay. May need a little more metoproolol. ----- Message ----- From: Leanor Kail, PA Sent: 11/04/2017   3:18 PM To: Belva Crome, MD  Please advise  rate versus rhythm control strategy.

## 2017-11-06 NOTE — Progress Notes (Signed)
Please increase Toprol XL to 75mg  qd. Follow up with Dr. Tamala Julian as schedule. Continue cardiac rehab.

## 2017-11-07 ENCOUNTER — Encounter: Payer: Self-pay | Admitting: Gynecology

## 2017-11-07 ENCOUNTER — Ambulatory Visit: Payer: Medicare Other | Admitting: Gynecology

## 2017-11-07 VITALS — BP 118/76 | Ht 63.0 in | Wt 169.0 lb

## 2017-11-07 DIAGNOSIS — N952 Postmenopausal atrophic vaginitis: Secondary | ICD-10-CM | POA: Diagnosis not present

## 2017-11-07 DIAGNOSIS — Z01411 Encounter for gynecological examination (general) (routine) with abnormal findings: Secondary | ICD-10-CM | POA: Diagnosis not present

## 2017-11-07 DIAGNOSIS — M8580 Other specified disorders of bone density and structure, unspecified site: Secondary | ICD-10-CM

## 2017-11-07 DIAGNOSIS — L900 Lichen sclerosus et atrophicus: Secondary | ICD-10-CM | POA: Diagnosis not present

## 2017-11-07 NOTE — Progress Notes (Signed)
    Hailey [name] 1935-05-25 [id number]        83 y.o.  G2P2001 for annual GYN exam.  Several issues noted below.  Past medical history,surgical history, problem list, medications, allergies, family history and social history were all reviewed and documented as reviewed in the EPIC chart.  ROS:  Performed with pertinent positives and negatives included in the history, assessment and plan.   Additional significant findings : None   Exam: [name] assistant Vitals:   11/07/17 1458  BP: 118/76  Weight: 169 lb (76.7 kg)  Height: 5\' 3"  (1.6 m)   Body mass index is 29.94 kg/m.  General appearance:  Normal affect, orientation and appearance. Skin: Grossly normal HEENT: Without gross lesions.  No cervical or supraclavicular adenopathy. Thyroid normal.  Lungs:  Clear without wheezing, rales or rhonchi Cardiac: RR, without RMG Abdominal:  Soft, nontender, without masses, guarding, rebound, organomegaly or hernia Breasts:  Examined lying and sitting without masses, retractions, discharge or axillary adenopathy.  Bilateral reduction scars noted. Pelvic:  Ext, BUS, Vagina: With atrophic changes.  Blanching of the perineal body/posterior fourchette consistent with history of lichen sclerosis.  Adnexa: Without masses or tenderness    Anus and perineum: Normal   Rectovaginal: Normal sphincter tone without palpated masses or tenderness.    Assessment/Plan:  83 y.o. G93P2001 female for annual GYN exam.  1. Postmenopausal/atrophic genital changes.  Status post TVH BSO in the past.  Is doing well without significant hot flushes or sweats. 2. Lichen sclerosis.  Uses Temovate 0.5% cream intermittently.  Has good results with this.  Exam consistent with her diagnoses.  No concerning changes otherwise.  She has a supply at home but will call when she needs more. 3. Osteopenia.  DEXA 11/2016 T score -2.0 FRAX 15% / 4.3%.  We discussed treatment options based on elevated hip FRAX value exceeding 3%.   Patient declined treatment previously and again declines today.  We will plan repeat DEXA next year at 2-year interval. 4. Mammography 09/2017.  Continue with annual mammography when due.  SBE monthly reviewed. 5. Pap smear 2011.  No Pap smear done today.  We both agree to stop screening per current screening guidelines based on hysterectomy and age. 6. Health maintenance.  No routine lab work done as patient does this elsewhere.  Follow-up 1 year, sooner as needed.   Anastasio Auerbach MD, 4:45 PM 11/07/2017

## 2017-11-07 NOTE — Patient Instructions (Signed)
Follow-up in 1 year for annual exam, sooner as needed. 

## 2017-11-08 ENCOUNTER — Encounter (HOSPITAL_COMMUNITY)
Admission: RE | Admit: 2017-11-08 | Discharge: 2017-11-08 | Disposition: A | Discharge status: Home / Self Care | Payer: Medicare Other | Source: Ambulatory Visit | Attending: Interventional Cardiology | Admitting: Interventional Cardiology

## 2017-11-08 DIAGNOSIS — Z7901 Long term (current) use of anticoagulants: Secondary | ICD-10-CM | POA: Insufficient documentation

## 2017-11-08 DIAGNOSIS — Z87891 Personal history of nicotine dependence: Secondary | ICD-10-CM | POA: Diagnosis not present

## 2017-11-08 DIAGNOSIS — I2510 Atherosclerotic heart disease of native coronary artery without angina pectoris: Secondary | ICD-10-CM | POA: Insufficient documentation

## 2017-11-08 DIAGNOSIS — M8580 Other specified disorders of bone density and structure, unspecified site: Secondary | ICD-10-CM | POA: Insufficient documentation

## 2017-11-08 DIAGNOSIS — Z86010 Personal history of colonic polyps: Secondary | ICD-10-CM | POA: Diagnosis not present

## 2017-11-08 DIAGNOSIS — M419 Scoliosis, unspecified: Secondary | ICD-10-CM | POA: Insufficient documentation

## 2017-11-08 DIAGNOSIS — K589 Irritable bowel syndrome without diarrhea: Secondary | ICD-10-CM | POA: Insufficient documentation

## 2017-11-08 DIAGNOSIS — I4891 Unspecified atrial fibrillation: Secondary | ICD-10-CM | POA: Diagnosis not present

## 2017-11-08 DIAGNOSIS — Z955 Presence of coronary angioplasty implant and graft: Secondary | ICD-10-CM | POA: Diagnosis not present

## 2017-11-08 DIAGNOSIS — Z79899 Other long term (current) drug therapy: Secondary | ICD-10-CM | POA: Diagnosis not present

## 2017-11-08 DIAGNOSIS — K219 Gastro-esophageal reflux disease without esophagitis: Secondary | ICD-10-CM | POA: Diagnosis not present

## 2017-11-08 DIAGNOSIS — Z7982 Long term (current) use of aspirin: Secondary | ICD-10-CM | POA: Insufficient documentation

## 2017-11-08 DIAGNOSIS — Z7902 Long term (current) use of antithrombotics/antiplatelets: Secondary | ICD-10-CM | POA: Diagnosis not present

## 2017-11-08 DIAGNOSIS — M1990 Unspecified osteoarthritis, unspecified site: Secondary | ICD-10-CM | POA: Diagnosis not present

## 2017-11-08 DIAGNOSIS — I10 Essential (primary) hypertension: Secondary | ICD-10-CM | POA: Insufficient documentation

## 2017-11-08 DIAGNOSIS — F419 Anxiety disorder, unspecified: Secondary | ICD-10-CM | POA: Insufficient documentation

## 2017-11-13 ENCOUNTER — Encounter (HOSPITAL_COMMUNITY)
Admission: RE | Admit: 2017-11-13 | Discharge: 2017-11-13 | Disposition: A | Discharge status: Home / Self Care | Payer: Medicare Other | Source: Ambulatory Visit | Attending: Interventional Cardiology | Admitting: Interventional Cardiology

## 2017-11-13 DIAGNOSIS — M419 Scoliosis, unspecified: Secondary | ICD-10-CM | POA: Diagnosis not present

## 2017-11-13 DIAGNOSIS — I2510 Atherosclerotic heart disease of native coronary artery without angina pectoris: Secondary | ICD-10-CM | POA: Diagnosis not present

## 2017-11-13 DIAGNOSIS — M1990 Unspecified osteoarthritis, unspecified site: Secondary | ICD-10-CM | POA: Diagnosis not present

## 2017-11-13 DIAGNOSIS — Z79899 Other long term (current) drug therapy: Secondary | ICD-10-CM | POA: Diagnosis not present

## 2017-11-13 DIAGNOSIS — Z955 Presence of coronary angioplasty implant and graft: Secondary | ICD-10-CM | POA: Diagnosis not present

## 2017-11-13 DIAGNOSIS — K589 Irritable bowel syndrome without diarrhea: Secondary | ICD-10-CM | POA: Diagnosis not present

## 2017-11-13 DIAGNOSIS — M8580 Other specified disorders of bone density and structure, unspecified site: Secondary | ICD-10-CM | POA: Diagnosis not present

## 2017-11-13 DIAGNOSIS — Z86010 Personal history of colonic polyps: Secondary | ICD-10-CM | POA: Diagnosis not present

## 2017-11-13 DIAGNOSIS — Z7902 Long term (current) use of antithrombotics/antiplatelets: Secondary | ICD-10-CM | POA: Diagnosis not present

## 2017-11-13 DIAGNOSIS — Z87891 Personal history of nicotine dependence: Secondary | ICD-10-CM | POA: Diagnosis not present

## 2017-11-13 DIAGNOSIS — Z7901 Long term (current) use of anticoagulants: Secondary | ICD-10-CM | POA: Diagnosis not present

## 2017-11-13 DIAGNOSIS — K219 Gastro-esophageal reflux disease without esophagitis: Secondary | ICD-10-CM | POA: Diagnosis not present

## 2017-11-13 DIAGNOSIS — Z7982 Long term (current) use of aspirin: Secondary | ICD-10-CM | POA: Diagnosis not present

## 2017-11-13 DIAGNOSIS — I10 Essential (primary) hypertension: Secondary | ICD-10-CM | POA: Diagnosis not present

## 2017-11-13 DIAGNOSIS — I4891 Unspecified atrial fibrillation: Secondary | ICD-10-CM | POA: Diagnosis not present

## 2017-11-15 ENCOUNTER — Encounter (HOSPITAL_COMMUNITY)
Admission: RE | Admit: 2017-11-15 | Discharge: 2017-11-15 | Disposition: A | Discharge status: Home / Self Care | Payer: Medicare Other | Source: Ambulatory Visit | Attending: Interventional Cardiology | Admitting: Interventional Cardiology

## 2017-11-15 VITALS — Ht 63.5 in | Wt 172.0 lb

## 2017-11-15 DIAGNOSIS — I4891 Unspecified atrial fibrillation: Secondary | ICD-10-CM | POA: Diagnosis not present

## 2017-11-15 DIAGNOSIS — I10 Essential (primary) hypertension: Secondary | ICD-10-CM | POA: Diagnosis not present

## 2017-11-15 DIAGNOSIS — K589 Irritable bowel syndrome without diarrhea: Secondary | ICD-10-CM | POA: Diagnosis not present

## 2017-11-15 DIAGNOSIS — Z7902 Long term (current) use of antithrombotics/antiplatelets: Secondary | ICD-10-CM | POA: Diagnosis not present

## 2017-11-15 DIAGNOSIS — Z7982 Long term (current) use of aspirin: Secondary | ICD-10-CM | POA: Diagnosis not present

## 2017-11-15 DIAGNOSIS — M8580 Other specified disorders of bone density and structure, unspecified site: Secondary | ICD-10-CM | POA: Diagnosis not present

## 2017-11-15 DIAGNOSIS — Z955 Presence of coronary angioplasty implant and graft: Secondary | ICD-10-CM | POA: Diagnosis not present

## 2017-11-15 DIAGNOSIS — Z79899 Other long term (current) drug therapy: Secondary | ICD-10-CM | POA: Diagnosis not present

## 2017-11-15 DIAGNOSIS — K219 Gastro-esophageal reflux disease without esophagitis: Secondary | ICD-10-CM | POA: Diagnosis not present

## 2017-11-15 DIAGNOSIS — M1990 Unspecified osteoarthritis, unspecified site: Secondary | ICD-10-CM | POA: Diagnosis not present

## 2017-11-15 DIAGNOSIS — I2510 Atherosclerotic heart disease of native coronary artery without angina pectoris: Secondary | ICD-10-CM | POA: Diagnosis not present

## 2017-11-15 DIAGNOSIS — Z86010 Personal history of colonic polyps: Secondary | ICD-10-CM | POA: Diagnosis not present

## 2017-11-15 DIAGNOSIS — M419 Scoliosis, unspecified: Secondary | ICD-10-CM | POA: Diagnosis not present

## 2017-11-15 DIAGNOSIS — Z87891 Personal history of nicotine dependence: Secondary | ICD-10-CM | POA: Diagnosis not present

## 2017-11-15 DIAGNOSIS — Z7901 Long term (current) use of anticoagulants: Secondary | ICD-10-CM | POA: Diagnosis not present

## 2017-11-18 ENCOUNTER — Encounter (HOSPITAL_COMMUNITY)
Admission: RE | Admit: 2017-11-18 | Discharge: 2017-11-18 | Disposition: A | Discharge status: Home / Self Care | Payer: Medicare Other | Source: Ambulatory Visit | Attending: Interventional Cardiology | Admitting: Interventional Cardiology

## 2017-11-18 DIAGNOSIS — Z7982 Long term (current) use of aspirin: Secondary | ICD-10-CM | POA: Diagnosis not present

## 2017-11-18 DIAGNOSIS — M1990 Unspecified osteoarthritis, unspecified site: Secondary | ICD-10-CM | POA: Diagnosis not present

## 2017-11-18 DIAGNOSIS — K589 Irritable bowel syndrome without diarrhea: Secondary | ICD-10-CM | POA: Diagnosis not present

## 2017-11-18 DIAGNOSIS — Z7901 Long term (current) use of anticoagulants: Secondary | ICD-10-CM | POA: Diagnosis not present

## 2017-11-18 DIAGNOSIS — K219 Gastro-esophageal reflux disease without esophagitis: Secondary | ICD-10-CM | POA: Diagnosis not present

## 2017-11-18 DIAGNOSIS — Z79899 Other long term (current) drug therapy: Secondary | ICD-10-CM | POA: Diagnosis not present

## 2017-11-18 DIAGNOSIS — Z955 Presence of coronary angioplasty implant and graft: Secondary | ICD-10-CM

## 2017-11-18 DIAGNOSIS — I10 Essential (primary) hypertension: Secondary | ICD-10-CM | POA: Diagnosis not present

## 2017-11-18 DIAGNOSIS — M8580 Other specified disorders of bone density and structure, unspecified site: Secondary | ICD-10-CM | POA: Diagnosis not present

## 2017-11-18 DIAGNOSIS — I4891 Unspecified atrial fibrillation: Secondary | ICD-10-CM | POA: Diagnosis not present

## 2017-11-18 DIAGNOSIS — Z7902 Long term (current) use of antithrombotics/antiplatelets: Secondary | ICD-10-CM | POA: Diagnosis not present

## 2017-11-18 DIAGNOSIS — I2510 Atherosclerotic heart disease of native coronary artery without angina pectoris: Secondary | ICD-10-CM | POA: Diagnosis not present

## 2017-11-18 DIAGNOSIS — Z86010 Personal history of colonic polyps: Secondary | ICD-10-CM | POA: Diagnosis not present

## 2017-11-18 DIAGNOSIS — M419 Scoliosis, unspecified: Secondary | ICD-10-CM | POA: Diagnosis not present

## 2017-11-18 DIAGNOSIS — Z87891 Personal history of nicotine dependence: Secondary | ICD-10-CM | POA: Diagnosis not present

## 2017-11-20 ENCOUNTER — Encounter (HOSPITAL_COMMUNITY)
Admission: RE | Admit: 2017-11-20 | Discharge: 2017-11-20 | Disposition: A | Discharge status: Home / Self Care | Payer: Medicare Other | Source: Ambulatory Visit | Attending: Interventional Cardiology | Admitting: Interventional Cardiology

## 2017-11-20 DIAGNOSIS — Z955 Presence of coronary angioplasty implant and graft: Secondary | ICD-10-CM

## 2017-11-20 DIAGNOSIS — I10 Essential (primary) hypertension: Secondary | ICD-10-CM | POA: Diagnosis not present

## 2017-11-20 DIAGNOSIS — Z79899 Other long term (current) drug therapy: Secondary | ICD-10-CM | POA: Diagnosis not present

## 2017-11-20 DIAGNOSIS — K219 Gastro-esophageal reflux disease without esophagitis: Secondary | ICD-10-CM | POA: Diagnosis not present

## 2017-11-20 DIAGNOSIS — K589 Irritable bowel syndrome without diarrhea: Secondary | ICD-10-CM | POA: Diagnosis not present

## 2017-11-20 DIAGNOSIS — I4891 Unspecified atrial fibrillation: Secondary | ICD-10-CM | POA: Diagnosis not present

## 2017-11-20 DIAGNOSIS — I2510 Atherosclerotic heart disease of native coronary artery without angina pectoris: Secondary | ICD-10-CM | POA: Diagnosis not present

## 2017-11-20 DIAGNOSIS — M1990 Unspecified osteoarthritis, unspecified site: Secondary | ICD-10-CM | POA: Diagnosis not present

## 2017-11-20 DIAGNOSIS — Z7901 Long term (current) use of anticoagulants: Secondary | ICD-10-CM | POA: Diagnosis not present

## 2017-11-20 DIAGNOSIS — Z7902 Long term (current) use of antithrombotics/antiplatelets: Secondary | ICD-10-CM | POA: Diagnosis not present

## 2017-11-20 DIAGNOSIS — M419 Scoliosis, unspecified: Secondary | ICD-10-CM | POA: Diagnosis not present

## 2017-11-20 DIAGNOSIS — Z86010 Personal history of colonic polyps: Secondary | ICD-10-CM | POA: Diagnosis not present

## 2017-11-20 DIAGNOSIS — Z87891 Personal history of nicotine dependence: Secondary | ICD-10-CM | POA: Diagnosis not present

## 2017-11-20 DIAGNOSIS — Z7982 Long term (current) use of aspirin: Secondary | ICD-10-CM | POA: Diagnosis not present

## 2017-11-20 DIAGNOSIS — M8580 Other specified disorders of bone density and structure, unspecified site: Secondary | ICD-10-CM | POA: Diagnosis not present

## 2017-11-22 ENCOUNTER — Encounter (HOSPITAL_COMMUNITY)
Admission: RE | Admit: 2017-11-22 | Discharge: 2017-11-22 | Disposition: A | Discharge status: Home / Self Care | Payer: Medicare Other | Source: Ambulatory Visit | Attending: Interventional Cardiology | Admitting: Interventional Cardiology

## 2017-11-22 DIAGNOSIS — Z7902 Long term (current) use of antithrombotics/antiplatelets: Secondary | ICD-10-CM | POA: Diagnosis not present

## 2017-11-22 DIAGNOSIS — Z87891 Personal history of nicotine dependence: Secondary | ICD-10-CM | POA: Diagnosis not present

## 2017-11-22 DIAGNOSIS — I2510 Atherosclerotic heart disease of native coronary artery without angina pectoris: Secondary | ICD-10-CM | POA: Diagnosis not present

## 2017-11-22 DIAGNOSIS — Z86010 Personal history of colonic polyps: Secondary | ICD-10-CM | POA: Diagnosis not present

## 2017-11-22 DIAGNOSIS — M1990 Unspecified osteoarthritis, unspecified site: Secondary | ICD-10-CM | POA: Diagnosis not present

## 2017-11-22 DIAGNOSIS — I10 Essential (primary) hypertension: Secondary | ICD-10-CM | POA: Diagnosis not present

## 2017-11-22 DIAGNOSIS — I4891 Unspecified atrial fibrillation: Secondary | ICD-10-CM | POA: Diagnosis not present

## 2017-11-22 DIAGNOSIS — K589 Irritable bowel syndrome without diarrhea: Secondary | ICD-10-CM | POA: Diagnosis not present

## 2017-11-22 DIAGNOSIS — M419 Scoliosis, unspecified: Secondary | ICD-10-CM | POA: Diagnosis not present

## 2017-11-22 DIAGNOSIS — Z7982 Long term (current) use of aspirin: Secondary | ICD-10-CM | POA: Diagnosis not present

## 2017-11-22 DIAGNOSIS — Z955 Presence of coronary angioplasty implant and graft: Secondary | ICD-10-CM

## 2017-11-22 DIAGNOSIS — Z7901 Long term (current) use of anticoagulants: Secondary | ICD-10-CM | POA: Diagnosis not present

## 2017-11-22 DIAGNOSIS — K219 Gastro-esophageal reflux disease without esophagitis: Secondary | ICD-10-CM | POA: Diagnosis not present

## 2017-11-22 DIAGNOSIS — Z79899 Other long term (current) drug therapy: Secondary | ICD-10-CM | POA: Diagnosis not present

## 2017-11-22 DIAGNOSIS — M8580 Other specified disorders of bone density and structure, unspecified site: Secondary | ICD-10-CM | POA: Diagnosis not present

## 2017-11-25 ENCOUNTER — Encounter (HOSPITAL_COMMUNITY)
Admission: RE | Admit: 2017-11-25 | Discharge: 2017-11-25 | Disposition: A | Discharge status: Home / Self Care | Payer: Medicare Other | Source: Ambulatory Visit | Attending: Interventional Cardiology | Admitting: Interventional Cardiology

## 2017-11-25 DIAGNOSIS — M8580 Other specified disorders of bone density and structure, unspecified site: Secondary | ICD-10-CM | POA: Diagnosis not present

## 2017-11-25 DIAGNOSIS — K589 Irritable bowel syndrome without diarrhea: Secondary | ICD-10-CM | POA: Diagnosis not present

## 2017-11-25 DIAGNOSIS — K219 Gastro-esophageal reflux disease without esophagitis: Secondary | ICD-10-CM | POA: Diagnosis not present

## 2017-11-25 DIAGNOSIS — I10 Essential (primary) hypertension: Secondary | ICD-10-CM | POA: Diagnosis not present

## 2017-11-25 DIAGNOSIS — I2510 Atherosclerotic heart disease of native coronary artery without angina pectoris: Secondary | ICD-10-CM | POA: Diagnosis not present

## 2017-11-25 DIAGNOSIS — Z955 Presence of coronary angioplasty implant and graft: Secondary | ICD-10-CM | POA: Diagnosis not present

## 2017-11-25 DIAGNOSIS — M419 Scoliosis, unspecified: Secondary | ICD-10-CM | POA: Diagnosis not present

## 2017-11-25 DIAGNOSIS — Z7982 Long term (current) use of aspirin: Secondary | ICD-10-CM | POA: Diagnosis not present

## 2017-11-25 DIAGNOSIS — Z7902 Long term (current) use of antithrombotics/antiplatelets: Secondary | ICD-10-CM | POA: Diagnosis not present

## 2017-11-25 DIAGNOSIS — Z87891 Personal history of nicotine dependence: Secondary | ICD-10-CM | POA: Diagnosis not present

## 2017-11-25 DIAGNOSIS — M1990 Unspecified osteoarthritis, unspecified site: Secondary | ICD-10-CM | POA: Diagnosis not present

## 2017-11-25 DIAGNOSIS — Z79899 Other long term (current) drug therapy: Secondary | ICD-10-CM | POA: Diagnosis not present

## 2017-11-25 DIAGNOSIS — Z86010 Personal history of colonic polyps: Secondary | ICD-10-CM | POA: Diagnosis not present

## 2017-11-25 DIAGNOSIS — I4891 Unspecified atrial fibrillation: Secondary | ICD-10-CM | POA: Diagnosis not present

## 2017-11-25 DIAGNOSIS — Z7901 Long term (current) use of anticoagulants: Secondary | ICD-10-CM | POA: Diagnosis not present

## 2017-11-25 NOTE — Progress Notes (Signed)
Discharge Progress Report  Patient Details  Name: Hailey [name] MRN: [id number] Date of Birth: [birth year] Referring Provider:     [place] from 07/30/2017 in [place]  Referring Provider  Daneen Schick MD       Number of Visits: 32 Reason for Discharge:  Patient independent in their exercise. Patient has met program and personal goals.  Smoking History:  Social History   Tobacco Use  Smoking Status Former Smoker  . Packs/day: 0.25  . Years: 28.00  . Pack years: 7.00  . Types: Cigarettes  . Last attempt to quit: 1981  . Years since quitting: 38.2  Smokeless Tobacco Never Used    Diagnosis:  Stented coronary artery 07/01/17 DES RCA  ADL UCSD:   Initial Exercise Prescription: Initial Exercise Prescription - 07/30/17 1500      Date of Initial Exercise RX and Referring Provider   Date  07/30/17    Referring Provider  Daneen Schick MD      Treadmill   MPH  2.2    Grade  0    Minutes  10    METs  2.68      Recumbant Bike   Level  1.5    Watts  5    Minutes  10    METs  2.2      NuStep   Level  2    SPM  80    Minutes  10    METs  2      Prescription Details   Frequency (times per week)  3    Duration  Progress to 30 minutes of continuous aerobic without signs/symptoms of physical distress      Intensity   THRR 40-80% of Max Heartrate  56-112    Ratings of Perceived Exertion  11-13    Perceived Dyspnea  0-4      Progression   Progression  Continue to progress workloads to maintain intensity without signs/symptoms of physical distress.      Resistance Training   Training Prescription  Yes    Weight  2lbs    Reps  10-15       Discharge Exercise Prescription (Final Exercise Prescription Changes): Exercise Prescription Changes - 11/25/17 1500      Response to Exercise   Blood Pressure (Admit)  114/74    Blood Pressure (Exercise)  128/70    Blood Pressure (Exit)  122/76    Heart Rate  (Admit)  80 bpm    Heart Rate (Exercise)  100 bpm    Heart Rate (Exit)  67 bpm    Rating of Perceived Exertion (Exercise)  12    Duration  Continue with 45 min of aerobic exercise without signs/symptoms of physical distress.    Intensity  THRR unchanged      Progression   Progression  Continue to progress workloads to maintain intensity without signs/symptoms of physical distress.    Average METs  2.66      Resistance Training   Training Prescription  Yes    Weight  4lbs    Reps  10-15    Time  10 Minutes      Interval Training   Interval Training  No      Treadmill   MPH  2.3    Grade  2    Minutes  10    METs  3.39      Recumbant Bike   Level  2.5  Watts  25    Minutes  10    METs  2.2      NuStep   Level  4    SPM  80    Minutes  10    METs  2.2      Home Exercise Plan   Plans to continue exercise at  [employer] (comment) [employer]    Frequency  Add 4 additional days to program exercise sessions.    Initial Home Exercises Provided  08/26/17       Functional Capacity: 6 Minute Walk    Row Name 07/30/17 1540 11/18/17 1024       6 Minute Walk   Phase  Initial  Discharge    Distance  1328 feet  1627 feet    Distance % Change  -  22.52 %    Distance Feet Change  -  299 ft    Walk Time  6 minutes  6 minutes    # of Rest Breaks  0  0    MPH  2.5  3.08    METS  2.06  2.62    RPE  12  14    Perceived Dyspnea   -  1    VO2 Peak  7.22  9.16    Symptoms  Yes (comment)  Yes (comment)    Comments  mild SOB and brealthlessness at end of walk test  Mild SOB +1    Resting HR  57 bpm  67 bpm    Resting BP  122/68  112/78    Resting Oxygen Saturation   96 %  -    Exercise Oxygen Saturation  during 6 min walk  97 %  -    Max Ex. HR  89 bpm  84 bpm    Max Ex. BP  126/84  138/82    2 Minute Post BP  104/78  120/80       Psychological, QOL, Others - Outcomes: PHQ 2/9: Depression screen [facility] 2/9 12/03/2017 08/05/2017 07/11/2017 05/29/2016 12/08/2015   Decreased Interest 0 0 0 0 0  Down, Depressed, Hopeless 0 0 1 0 0  PHQ - 2 Score 0 0 1 0 0  Some recent data might be hidden    Quality of Life: Quality of Life - 11/25/17 1502      Quality of Life Scores   Health/Function Pre  24.13 %    Health/Function Post  20.68 %    Health/Function % Change  -14.3 %    Socioeconomic Pre  26.63 %    Socioeconomic Post  27.5 %    Socioeconomic % Change   3.27 %    Psych/Spiritual Pre  20.21 %    Psych/Spiritual Post  23.64 %    Psych/Spiritual % Change  16.97 %    Family Pre  25.2 %    Family Post  22.8 %    Family % Change  -9.52 %    GLOBAL Pre  24.07 %    GLOBAL Post  22.94 %    GLOBAL % Change  -4.69 %       Personal Goals: Goals established at orientation with interventions provided to work toward goal. Personal Goals and Risk Factors at Admission - 07/30/17 1525      Core Components/Risk Factors/Patient Goals on Admission    Weight Management  Yes;Obesity;Weight Maintenance;Weight Loss    Intervention  Weight Management: Develop a combined nutrition and exercise program designed to reach desired caloric  intake, while maintaining appropriate intake of nutrient and fiber, sodium and fats, and appropriate energy expenditure required for the weight goal.;Weight Management: Provide education and appropriate resources to help participant work on and attain dietary goals.;Weight Management/Obesity: Establish reasonable short term and long term weight goals.;Obesity: Provide education and appropriate resources to help participant work on and attain dietary goals.    Admit Weight  172 lb 9.9 oz (78.3 kg)    Goal Weight: Short Term  168 lb (76.2 kg)    Goal Weight: Long Term  162 lb (73.5 kg)    Expected Outcomes  Short Term: Continue to assess and modify interventions until short term weight is achieved;Long Term: Adherence to nutrition and physical activity/exercise program aimed toward attainment of established weight goal;Weight  Maintenance: Understanding of the daily nutrition guidelines, which includes 25-35% calories from fat, 7% or less cal from saturated fats, less than 269m cholesterol, less than 1.5gm of sodium, & 5 or more servings of fruits and vegetables daily;Weight Loss: Understanding of general recommendations for a balanced deficit meal plan, which promotes 1-2 lb weight loss per week and includes a negative energy balance of [phone] kcal/d;Understanding recommendations for meals to include 15-35% energy as protein, 25-35% energy from fat, 35-60% energy from carbohydrates, less than 2014mof dietary cholesterol, 20-35 gm of total fiber daily;Understanding of distribution of calorie intake throughout the day with the consumption of 4-5 meals/snacks    Hypertension  Yes    Intervention  Provide education on lifestyle modifcations including regular physical activity/exercise, weight management, moderate sodium restriction and increased consumption of fresh fruit, vegetables, and low fat dairy, alcohol moderation, and smoking cessation.;Monitor prescription use compliance.    Expected Outcomes  Short Term: Continued assessment and intervention until BP is < 140/9039mG in hypertensive participants. < 130/14m33m in hypertensive participants with diabetes, heart failure or chronic kidney disease.;Long Term: Maintenance of blood pressure at goal levels.    Stress  Yes    Intervention  Offer individual and/or small group education and counseling on adjustment to heart disease, stress management and health-related lifestyle change. Teach and support self-help strategies.;Refer participants experiencing significant psychosocial distress to appropriate mental health specialists for further evaluation and treatment. When possible, include family members and significant others in education/counseling sessions.    Expected Outcomes  Short Term: Participant demonstrates changes in health-related behavior, relaxation and other stress  management skills, ability to obtain effective social support, and compliance with psychotropic medications if prescribed.;Long Term: Emotional wellbeing is indicated by absence of clinically significant psychosocial distress or social isolation.        Personal Goals Discharge: Goals and Risk Factor Review    Row Name 09/05/17 1559 10/01/17 1737 10/31/17 1228 12/04/17 0904       Core Components/Risk Factors/Patient Goals Review   Personal Goals Review  Weight Management/Obesity;Lipids;Hypertension  Weight Management/Obesity;Lipids;Hypertension  Weight Management/Obesity;Lipids;Hypertension  Weight Management/Obesity;Lipids;Hypertension    Review  Hailey [name]'s vital signs have been stable at cardiac rehab. Hailey [name] has been doing well with exercise.  Hailey [name]'s vital signs have been stable at cardiac rehab. Hailey [name] has been doing well with exercise.  Hailey [name]'s vital signs have been stable at cardiac rehab. Hailey [name] has been doing well with exercise. Hailey [name] has not attended exercise since 10/07/17 due to vacation  Hailey [name]'s vital signs have been stable at cardiac rehab. Hailey [name] has been doing well with exercise.     Expected Outcomes  Hailey [name] will continue to participate in phase 2 cardiac rehab. Hailey [name] will continue to take her  medicaitons as presribed  Hailey [name] will continue to participate in phase 2 cardiac rehab. Hailey [name] will continue to take her medicaitons as presribed  Hailey [name] will continue to participate in phase 2 cardiac rehab when she returns from vacation.  Hailey [name] will continue to take her medications as presribed and will continue to exercise at [employer] upon discharge from cardiac rehab.       Exercise Goals and Review: Exercise Goals    Row Name 07/30/17 1550             Exercise Goals   Increase Physical Activity  Yes       Intervention  Provide advice, education, support and counseling about physical activity/exercise needs.;Develop an individualized exercise prescription for aerobic and resistive  training based on initial evaluation findings, risk stratification, comorbidities and participant's personal goals.       Expected Outcomes  Achievement of increased cardiorespiratory fitness and enhanced flexibility, muscular endurance and strength shown through measurements of functional capacity and personal statement of participant.       Increase Strength and Stamina  Yes       Intervention  Provide advice, education, support and counseling about physical activity/exercise needs.;Develop an individualized exercise prescription for aerobic and resistive training based on initial evaluation findings, risk stratification, comorbidities and participant's personal goals.       Expected Outcomes  Achievement of increased cardiorespiratory fitness and enhanced flexibility, muscular endurance and strength shown through measurements of functional capacity and personal statement of participant.       Able to understand and use rate of perceived exertion (RPE) scale  Yes       Intervention  Provide education and explanation on how to use RPE scale       Expected Outcomes  Short Term: Able to use RPE daily in rehab to express subjective intensity level;Long Term:  Able to use RPE to guide intensity level when exercising independently       Knowledge and understanding of Target Heart Rate Range (THRR)  Yes       Intervention  Provide education and explanation of THRR including how the numbers were predicted and where they are located for reference       Expected Outcomes  Short Term: Able to state/look up THRR;Long Term: Able to use THRR to govern intensity when exercising independently;Short Term: Able to use daily as guideline for intensity in rehab       Able to check pulse independently  Yes       Intervention  Provide education and demonstration on how to check pulse in carotid and radial arteries.;Review the importance of being able to check your own pulse for safety during independent exercise        Expected Outcomes  Short Term: Able to explain why pulse checking is important during independent exercise;Long Term: Able to check pulse independently and accurately       Understanding of Exercise Prescription  Yes       Intervention  Provide education, explanation, and written materials on patient's individual exercise prescription       Expected Outcomes  Short Term: Able to explain program exercise prescription;Long Term: Able to explain home exercise prescription to exercise independently          Nutrition & Weight - Outcomes: Pre Biometrics - 07/30/17 1652      Pre Biometrics   Height  5' 3.5" (1.613 m)    Weight  172 lb 9.9 oz (78.3 kg)    Waist  Circumference  29.5 inches    Hip Circumference  44.5 inches    Waist to Hip Ratio  0.66 %    BMI (Calculated)  30.09    Triceps Skinfold  26 mm    % Body Fat  38.7 %    Grip Strength  25 kg    Flexibility  8.5 in    Single Leg Stand  6 seconds      Post Biometrics - 11/18/17 1028       Post  Biometrics   Height  5' 3.5" (1.613 m)    Weight  171 lb 15.3 oz (78 kg)    Waist Circumference  32 inches    Hip Circumference  45 inches    Waist to Hip Ratio  0.71 %    BMI (Calculated)  29.98    Triceps Skinfold  26 mm    % Body Fat  39.6 %    Grip Strength  26 kg    Flexibility  10 in    Single Leg Stand  4.57 seconds       Nutrition: Nutrition Therapy & Goals - 07/30/17 1447      Nutrition Therapy   Diet  Heart Healthy      Personal Nutrition Goals   Nutrition Goal  Pt to identify food quantities necessary to achieve weight loss of 6-24 lb at graduation from cardiac rehab.       Intervention Plan   Intervention  Prescribe, educate and counsel regarding individualized specific dietary modifications aiming towards targeted core components such as weight, hypertension, lipid management, diabetes, heart failure and other comorbidities.    Expected Outcomes  Short Term Goal: Understand basic principles of dietary content,  such as calories, fat, sodium, cholesterol and nutrients.;Long Term Goal: Adherence to prescribed nutrition plan.       Nutrition Discharge: Nutrition Assessments - 11/29/17 0940      MEDFICTS Scores   Pre Score  55    Post Score  39    Score Difference  -16       Education Questionnaire Score: Knowledge Questionnaire Score - 11/25/17 1502      Knowledge Questionnaire Score   Pre Score  19/24    Post Score  23/24       Goals reviewed with patient; copy given to patient.Hailey [name] graduated from cardiac rehab program today with completion of 32 exercise sessions in Phase II. Pt maintained good attendance and progressed nicely during her participation in rehab as evidenced by increased MET level.   Medication list reconciled. Repeat  PHQ score- 0 .  Pt has made significant lifestyle changes and should be commended for her success. Pt feels she has achieved her goals during cardiac rehab.   Hailey [name] plans to continue exercise at the [employer]. Hailey [name] increased her distance on her post exercise walk test and lost 1.5 kg. Hailey [name] has remained in rate controlled Atrial fibrillation since before she went on her trip to [place]. Hailey [name]'s vital signs have been stable and she has remained asymptomatic.Barnet Pall, RN,BSN 12/04/2017 9:23 AM

## 2017-12-12 ENCOUNTER — Telehealth: Payer: Self-pay | Admitting: Interventional Cardiology

## 2017-12-12 ENCOUNTER — Ambulatory Visit: Payer: Self-pay | Admitting: Interventional Cardiology

## 2017-12-12 NOTE — Telephone Encounter (Signed)
New Message    Patient wants you to work her in sooner to see Dr Tamala Julian, she does not want to wait until July she is going out of town.

## 2017-12-12 NOTE — Telephone Encounter (Signed)
Spoke with pt and rescheduled her to see Dr. Tamala Julian 4/24.  Pt appreciative for call.

## 2017-12-16 ENCOUNTER — Telehealth: Payer: Self-pay | Admitting: Interventional Cardiology

## 2017-12-16 ENCOUNTER — Ambulatory Visit: Payer: Medicare Other | Admitting: Sports Medicine

## 2017-12-16 VITALS — BP 118/69 | Ht 64.0 in | Wt 167.0 lb

## 2017-12-16 DIAGNOSIS — M5106 Intervertebral disc disorders with myelopathy, lumbar region: Secondary | ICD-10-CM | POA: Diagnosis not present

## 2017-12-16 DIAGNOSIS — M7631 Iliotibial band syndrome, right leg: Secondary | ICD-10-CM

## 2017-12-16 MED ORDER — PREDNISONE 10 MG PO TABS: ORAL_TABLET | ORAL | 0 refills | Status: DC

## 2017-12-16 NOTE — Progress Notes (Signed)
Subjective:    Patient ID: Hailey [name], female    DOB: 1935-02-18, 83 y.o.   MRN: [id number]   CC: Right IT band tightness and left low back/hip pain  HPI: Patient is 83 yo female who presents today complaining of right IT band tightness and left low back pain/hip pain. Patient reports that symptoms started for both complaints about two weeks ago. She has been taking tylenol every 4  Hours to help with the pain. Patient has IT band tightness in the past on the left left for which she did exercises with complete resolution of her symptoms. Patient has also had chronic low back pain in the past and had injections in the past with long term relief. She has also had short course steroid with good relief in symptoms. She is currently on Eliquis and Plavix and realize back injection are likely not going to be an option for her. She states after she work with her physical therapy person, she noticed improvement in IT band pain because she stretches prior to working out. However, with long period of inactivity such as sitting of lying down, tightness returns. She has not use any heat. She continue to be active and try to walk on a regular basis but has some limitations due to pain. Patient does endorses occasional left groin pain. She denies any numbness, tingling, urinary or bowel incontinence.  Smoking status reviewed   ROS: all other systems were reviewed and are negative other than in the HPI   Past Medical History:  Diagnosis Date  . Allergic rhinitis   . Anxiety   . Arthritis    "my whole spine" (07/01/2017)  . Atrial fibrillation (Arial)   . Coronary artery disease    10/18 PCI/DES to p/m LCx with cutting balloon to mLcx  . Diverticulosis of colon   . GERD (gastroesophageal reflux disease)   . Hip bursitis 2010   Dr Para March, Post op seroma  . History of colon polyps   . HTN (hypertension)   . IBS (irritable bowel syndrome)    constipation predominant - Dr Earlean Shawl  . Lichen sclerosus     . Osteopenia 11/2016   T score -2.0 FRAX 15%/4.3%  . PAC (premature atrial contraction)    Symptomatiic  . Scoliosis   . SVT (supraventricular tachycardia) ([place])    brief history    Past Surgical History:  Procedure Laterality Date  . ANTERIOR AND POSTERIOR VAGINAL REPAIR  01/2002   Archie Endo 01/23/2011  . APPENDECTOMY  1948  . CARDIAC CATHETERIZATION  06/26/2017  . CORONARY ANGIOPLASTY WITH STENT PLACEMENT  07/01/2017  . CORONARY ATHERECTOMY N/A 07/01/2017   Procedure: CORONARY ATHERECTOMY;  Surgeon: Belva Crome, MD;  Location: [facility];  Service: Cardiovascular;  Laterality: N/A;  . CORONARY STENT INTERVENTION N/A 07/01/2017   Procedure: CORONARY STENT INTERVENTION;  Surgeon: Belva Crome, MD;  Location: [facility];  Service: Cardiovascular;  Laterality: N/A;  . HAMMER TOE SURGERY    . HEMORRHOID BANDING    . HIP SURGERY Left 04/2009   hip examination under anesthesia followed by greater trochanteric bursectomy; iliotibial band tenotomy/notes 01/20/2011  . KNEE BURSECTOMY Right 04/2009   Archie Endo 01/09/2011  . LEFT HEART CATH AND CORONARY ANGIOGRAPHY N/A 06/26/2017   Procedure: LEFT HEART CATH AND CORONARY ANGIOGRAPHY;  Surgeon: Belva Crome, MD;  Location: [facility];  Service: Cardiovascular;  Laterality: N/A;  . PUBOVAGINAL SLING  01/2002   Archie Endo 01/23/2011  .  REDUCTION MAMMAPLASTY    . TEMPORARY PACEMAKER N/A 07/01/2017   Procedure: TEMPORARY PACEMAKER;  Surgeon: Belva Crome, MD;  Location: [facility];  Service: Cardiovascular;  Laterality: N/A;  . VAGINAL HYSTERECTOMY  01/2002   Vaginal hysterectomy, bilateral salpingo-oophorectomy/notes 01/23/2011    Past medical history, surgical, family, and social history reviewed and updated in the EMR as appropriate.  Objective:  BP 118/69   Ht 5\' 4"  (1.626 m)   Wt 167 lb (75.8 kg)   BMI 28.67 kg/m   Vitals and nursing note reviewed  General: NAD, pleasant, able to participate in  exam Bilateral hip exam: Good range of motion bilaterally with ABduction and ADdcution. Internal and external rotation are within normal limits. Left iliac tenderness on palpation radiating to left hip. Positive straight leg raise on the right. Strength is 4/5 on the right hip and 5/5 on the left hip with abduction. Neurovascularly intact distally.    Assessment & Plan:    Iliotibial band syndrome of right side Patient presents with iliotibial band tightness on the right side with has improved with stretching. Patient had similar condition on left leg in the past which improved with hip and abductor strengthening exercises. On exam patient has mildly reduced strength with abductions and has tenderness with external rotation along the lateral aspect of her right thigh extending to her knee consistent with iliotibial band tightness. --Will provide strengthening exercises for hip and abductor muscles. Personal trainer will aid with them. --Tylenol for pain --Follow up in clinic as needed  Intervertebral lumbar disc disorder with myelopathy, lumbar region Patient presents with left flank pain radiating to her hip for the past two weeks. Patient reports that she has been taking tylenol every 4 hours to help with pain. Some intermittent groin pain but mostly flank pain radiating to hip. Patient has know chronic degenerative lumbar disease for which she received injection and oral steroids in the past. Exam findings are consistent with lumbar pain radiating to her hip, likely an inflammatory in nature. No reds flags. Given patient is on eliquis and plavix, inection are not an option. Will  Consider oral steroid given good response in the past. Patient will consult Cardiology office prior to initiation of therapy to rule out any interaction given recent stenting. --Prednisone taper regimen, starting dose 60 mg. Course 6 days. --Follow up in clinic as needed     Marjie Skiff, MD [facility] PGY-2   Patient seen and evaluated with the resident. I agree with the above plan of care. Patient has done well in the past with a 6 day Sterapred Dosepak and home exercises. We will repeat this treatment today. I did ask her to check with her cardiologist just to make sure that they had no objections to her starting prednisone. Follow-up for ongoing or recalcitrant issues.

## 2017-12-16 NOTE — Telephone Encounter (Signed)
Pt c/o medication issue:  1. Name of Medication: Prednisone   2. How are you currently taking this medication (dosage and times per day)? Gradual dosage  3. Are you having a reaction (difficulty breathing--STAT)? no 4. What is your medication issue? Patient taking medication for pain management and was told to check with Dr. Tamala Julian if it would be okay to continue medication

## 2017-12-16 NOTE — Assessment & Plan Note (Signed)
Patient presents with iliotibial band tightness on the right side with has improved with stretching. Patient had similar condition on left leg in the past which improved with hip and abductor strengthening exercises. On exam patient has mildly reduced strength with abductions and has tenderness with external rotation along the lateral aspect of her right thigh extending to her knee consistent with iliotibial band tightness. --Will provide strengthening exercises for hip and abductor muscles. Personal trainer will aid with them. --Tylenol for pain --Follow up in clinic as needed

## 2017-12-16 NOTE — Assessment & Plan Note (Signed)
Patient presents with left flank pain radiating to her hip for the past two weeks. Patient reports that she has been taking tylenol every 4 hours to help with pain. Some intermittent groin pain but mostly flank pain radiating to hip. Patient has know chronic degenerative lumbar disease for which she received injection and oral steroids in the past. Exam findings are consistent with lumbar pain radiating to her hip, likely an inflammatory in nature. No reds flags. Given patient is on eliquis and plavix, inection are not an option. Will  Consider oral steroid given good response in the past. Patient will consult Cardiology office prior to initiation of therapy to rule out any interaction given recent stenting. --Prednisone taper regimen, starting dose 60 mg. Course 6 days. --Follow up in clinic as needed

## 2017-12-16 NOTE — Patient Instructions (Signed)
It was great seeing you today! We have addressed the following issues today  1. We wil prescribe steroids for your to help with the pain and inflammation. You should check with your cardiology ( Dr.Smith) prior to starting regimen. You do not forsee any issue with you current medication regimen. The steroid course will last 6 days and it will taper as such:   -Day 1: 60 mg    -Day 2: 50 mg    -Day 3: 40 mg    -Day 4: 30 mg   -Day 5: 20 mg   -Day 6: 10 mg 2. Will also provide you IT band exercises and hip abductor strengthening.  If we did any lab work today, and the results require attention, either me or my nurse will get in touch with you. If everything is normal, you will get a letter in mail and a message via . If you don't hear from [place] in two weeks, please give [place] a call. Otherwise, we look forward to seeing you again at your next visit. If you have any questions or concerns before then, please call the clinic at [phone].  Please bring all your medications to every doctors visit  Sign up for My Chart to have easy access to your labs results, and communication with your Primary care physician. Please ask Front Desk for some assistance.   Please check-out at the front desk before leaving the clinic.    Take Care,   Dr. Draper/ Dr.Nickey Kloepfer

## 2017-12-17 ENCOUNTER — Encounter: Payer: Self-pay | Admitting: Sports Medicine

## 2017-12-17 ENCOUNTER — Other Ambulatory Visit: Payer: Self-pay | Admitting: Cardiology

## 2017-12-17 ENCOUNTER — Other Ambulatory Visit: Payer: Self-pay | Admitting: Internal Medicine

## 2017-12-17 NOTE — Telephone Encounter (Signed)
Thanks

## 2017-12-17 NOTE — Telephone Encounter (Signed)
I was seeing Ms. Maddix's husband today for his visit - Delma Freeze - he explained that Ms. Stemmler had a med questions.   Reviewed with pharmacy here - ok to use short course of steroids.   Mr. Horton Chin will relay this message to his wife.   Burtis Junes, RN, [facility] [address] [place], [place]  [address] [phone]

## 2017-12-18 NOTE — Telephone Encounter (Signed)
Ok to fill.  She was switched to this d/t being on Plavix.  Thanks!

## 2017-12-18 NOTE — Telephone Encounter (Signed)
Pt's pharmacy is requesting a refill on atorvastatin and PANTOPRAZOLE . Pt was prescribed atorvastatin in the hospital and pantoprazole is not on pt's med list. Please address

## 2017-12-22 ENCOUNTER — Emergency Department (HOSPITAL_COMMUNITY): Payer: Medicare Other

## 2017-12-22 ENCOUNTER — Emergency Department (HOSPITAL_COMMUNITY)
Admission: EM | Admit: 2017-12-22 | Discharge: 2017-12-23 | Disposition: A | Discharge status: Home / Self Care | Payer: Medicare Other | Source: Home / Self Care | Attending: Emergency Medicine | Admitting: Emergency Medicine

## 2017-12-22 DIAGNOSIS — Z7902 Long term (current) use of antithrombotics/antiplatelets: Secondary | ICD-10-CM

## 2017-12-22 DIAGNOSIS — Z79899 Other long term (current) drug therapy: Secondary | ICD-10-CM | POA: Insufficient documentation

## 2017-12-22 DIAGNOSIS — E860 Dehydration: Secondary | ICD-10-CM | POA: Diagnosis not present

## 2017-12-22 DIAGNOSIS — W2203XA Walked into furniture, initial encounter: Secondary | ICD-10-CM | POA: Insufficient documentation

## 2017-12-22 DIAGNOSIS — Y92009 Unspecified place in unspecified non-institutional (private) residence as the place of occurrence of the external cause: Secondary | ICD-10-CM

## 2017-12-22 DIAGNOSIS — I951 Orthostatic hypotension: Secondary | ICD-10-CM | POA: Diagnosis present

## 2017-12-22 DIAGNOSIS — Y9301 Activity, walking, marching and hiking: Secondary | ICD-10-CM

## 2017-12-22 DIAGNOSIS — S76111A Strain of right quadriceps muscle, fascia and tendon, initial encounter: Secondary | ICD-10-CM | POA: Insufficient documentation

## 2017-12-22 DIAGNOSIS — D62 Acute posthemorrhagic anemia: Secondary | ICD-10-CM | POA: Diagnosis present

## 2017-12-22 DIAGNOSIS — Z23 Encounter for immunization: Secondary | ICD-10-CM

## 2017-12-22 DIAGNOSIS — W010XXA Fall on same level from slipping, tripping and stumbling without subsequent striking against object, initial encounter: Secondary | ICD-10-CM | POA: Diagnosis present

## 2017-12-22 DIAGNOSIS — I2510 Atherosclerotic heart disease of native coronary artery without angina pectoris: Secondary | ICD-10-CM | POA: Insufficient documentation

## 2017-12-22 DIAGNOSIS — S8990XA Unspecified injury of unspecified lower leg, initial encounter: Secondary | ICD-10-CM | POA: Diagnosis not present

## 2017-12-22 DIAGNOSIS — Y929 Unspecified place or not applicable: Secondary | ICD-10-CM | POA: Insufficient documentation

## 2017-12-22 DIAGNOSIS — Z955 Presence of coronary angioplasty implant and graft: Secondary | ICD-10-CM

## 2017-12-22 DIAGNOSIS — S76111D Strain of right quadriceps muscle, fascia and tendon, subsequent encounter: Secondary | ICD-10-CM | POA: Diagnosis not present

## 2017-12-22 DIAGNOSIS — I25119 Atherosclerotic heart disease of native coronary artery with unspecified angina pectoris: Secondary | ICD-10-CM | POA: Diagnosis not present

## 2017-12-22 DIAGNOSIS — Y999 Unspecified external cause status: Secondary | ICD-10-CM

## 2017-12-22 DIAGNOSIS — S81001A Unspecified open wound, right knee, initial encounter: Secondary | ICD-10-CM | POA: Diagnosis present

## 2017-12-22 DIAGNOSIS — Z87891 Personal history of nicotine dependence: Secondary | ICD-10-CM

## 2017-12-22 DIAGNOSIS — S76109A Unspecified injury of unspecified quadriceps muscle, fascia and tendon, initial encounter: Secondary | ICD-10-CM | POA: Diagnosis not present

## 2017-12-22 DIAGNOSIS — G8911 Acute pain due to trauma: Secondary | ICD-10-CM | POA: Diagnosis not present

## 2017-12-22 DIAGNOSIS — N183 Chronic kidney disease, stage 3 (moderate): Secondary | ICD-10-CM | POA: Diagnosis present

## 2017-12-22 DIAGNOSIS — D689 Coagulation defect, unspecified: Secondary | ICD-10-CM

## 2017-12-22 DIAGNOSIS — K219 Gastro-esophageal reflux disease without esophagitis: Secondary | ICD-10-CM | POA: Diagnosis not present

## 2017-12-22 DIAGNOSIS — I4510 Unspecified right bundle-branch block: Secondary | ICD-10-CM | POA: Diagnosis not present

## 2017-12-22 DIAGNOSIS — Z7901 Long term (current) use of anticoagulants: Secondary | ICD-10-CM | POA: Diagnosis not present

## 2017-12-22 DIAGNOSIS — S83511D Sprain of anterior cruciate ligament of right knee, subsequent encounter: Secondary | ICD-10-CM

## 2017-12-22 DIAGNOSIS — Y939 Activity, unspecified: Secondary | ICD-10-CM

## 2017-12-22 DIAGNOSIS — L03115 Cellulitis of right lower limb: Secondary | ICD-10-CM | POA: Diagnosis not present

## 2017-12-22 DIAGNOSIS — I480 Paroxysmal atrial fibrillation: Secondary | ICD-10-CM | POA: Diagnosis not present

## 2017-12-22 DIAGNOSIS — N182 Chronic kidney disease, stage 2 (mild): Secondary | ICD-10-CM | POA: Diagnosis not present

## 2017-12-22 DIAGNOSIS — R001 Bradycardia, unspecified: Secondary | ICD-10-CM | POA: Diagnosis not present

## 2017-12-22 DIAGNOSIS — T148XXA Other injury of unspecified body region, initial encounter: Secondary | ICD-10-CM | POA: Diagnosis not present

## 2017-12-22 DIAGNOSIS — M25561 Pain in right knee: Secondary | ICD-10-CM | POA: Diagnosis not present

## 2017-12-22 DIAGNOSIS — I4891 Unspecified atrial fibrillation: Secondary | ICD-10-CM | POA: Diagnosis not present

## 2017-12-22 DIAGNOSIS — I129 Hypertensive chronic kidney disease with stage 1 through stage 4 chronic kidney disease, or unspecified chronic kidney disease: Secondary | ICD-10-CM | POA: Diagnosis present

## 2017-12-22 DIAGNOSIS — I10 Essential (primary) hypertension: Secondary | ICD-10-CM | POA: Insufficient documentation

## 2017-12-22 DIAGNOSIS — S8991XA Unspecified injury of right lower leg, initial encounter: Secondary | ICD-10-CM | POA: Diagnosis not present

## 2017-12-22 DIAGNOSIS — R55 Syncope and collapse: Secondary | ICD-10-CM | POA: Diagnosis not present

## 2017-12-22 DIAGNOSIS — M7989 Other specified soft tissue disorders: Secondary | ICD-10-CM | POA: Diagnosis not present

## 2017-12-22 MED ORDER — TETANUS-DIPHTH-ACELL PERTUSSIS 5-2.5-18.5 LF-MCG/0.5 IM SUSP
0.5000 mL | Freq: Once | INTRAMUSCULAR | Status: AC
  Administered 2017-12-22: 0.5 mL via INTRAMUSCULAR
  Filled 2017-12-22: qty 0.5

## 2017-12-22 MED ORDER — HYDROMORPHONE HCL 2 MG/ML IJ SOLN
1.0000 mg | Freq: Once | INTRAMUSCULAR | Status: AC
  Administered 2017-12-22: 1 mg via INTRAVENOUS
  Filled 2017-12-22: qty 1

## 2017-12-22 MED ORDER — LORAZEPAM 2 MG/ML IJ SOLN
0.5000 mg | Freq: Once | INTRAMUSCULAR | Status: AC
  Administered 2017-12-22: 0.5 mg via INTRAVENOUS
  Filled 2017-12-22: qty 1

## 2017-12-22 MED ORDER — LIDOCAINE HCL (PF) 2 % IJ SOLN
30.0000 mL | Freq: Once | INTRAMUSCULAR | Status: AC
  Administered 2017-12-22: 30 mL
  Filled 2017-12-22: qty 30

## 2017-12-22 MED ORDER — FENTANYL CITRATE (PF) 100 MCG/2ML IJ SOLN
50.0000 ug | Freq: Once | INTRAMUSCULAR | Status: AC
  Administered 2017-12-22: 50 ug via INTRAVENOUS
  Filled 2017-12-22: qty 2

## 2017-12-22 MED ORDER — KETOROLAC TROMETHAMINE 30 MG/ML IJ SOLN
15.0000 mg | Freq: Once | INTRAMUSCULAR | Status: AC
  Administered 2017-12-22: 15 mg via INTRAVENOUS
  Filled 2017-12-22: qty 1

## 2017-12-22 NOTE — ED Provider Notes (Signed)
Contusion to right knee CT knee will be necessary Needing increasing pain medications Will need lac repair  Anticipate discharge home with knee immobilizer  Evaluation of the patient finds her pain difficult to control. She has significant pain with any movement, describing 'spasms' in her leg in addition to pain at the knee itself.   CT scan ordered and shows partial rupture of the distal quadriceps muscle and large 8.9 cm prepatellar hematoma. No fracture.  Findings discussed with on-call for Dr. Noemi Chapel, Dr. Griffin Basil, who advised the patient can be managed in the outpatient setting.   Discussed discharge home with the patient and husband who have concerns about managing at home.   Posterior Splint applied as knee immobilizer could not be used secondary to swelling. The patient is much more comfortable. She is able to go from lying to sitting up without pain. She stands with assistance and is able to pivot safely.   Patient and husband are comfortable with discharge. EMS transport provided as there are steps into the house they could not navigate safely without help. They have a wheelchair at home and live in a one-story home. Will discharge home with close ortho follow up.   Charlann Lange, PA-C 12/23/17 [id number]    Daleen Bo, MD 12/23/17 [phone]

## 2017-12-22 NOTE — ED Notes (Signed)
MD notified of patients request for pain medication

## 2017-12-22 NOTE — ED Notes (Signed)
ED Provider at bedside. 

## 2017-12-22 NOTE — ED Provider Notes (Signed)
[facility] EMERGENCY DEPARTMENT Provider Note   CSN: [id number] Arrival date & time: 12/22/17  1916     History   Chief Complaint Chief Complaint  Patient presents with  . Fall  . Knee Injury    HPI Hailey [name] is a 83 y.o. female.  Patient was walking in her home carrying some things, did not see a table which she ran into which caused her to fall.  She injured her right knee.  She was able to ambulate afterwards but was concerned because there was a cut on her knee and so came here.  Patient has bandaged the wound prior to arrival.  Patient denies other injuries including head, neck, back, abdomen and chest.  She is on anticoagulants.  There are no other known modifying factors.  HPI  Past Medical History:  Diagnosis Date  . Allergic rhinitis   . Anxiety   . Arthritis    "my whole spine" (07/01/2017)  . Atrial fibrillation ([place])   . Coronary artery disease    10/18 PCI/DES to p/m LCx with cutting balloon to mLcx  . Diverticulosis of colon   . GERD (gastroesophageal reflux disease)   . Hip bursitis 2010   Dr Para March, Post op seroma  . History of colon polyps   . HTN (hypertension)   . IBS (irritable bowel syndrome)    constipation predominant - Dr Earlean Shawl  . Lichen sclerosus   . Osteopenia 11/2016   T score -2.0 FRAX 15%/4.3%  . PAC (premature atrial contraction)    Symptomatiic  . Scoliosis   . SVT (supraventricular tachycardia) ([place])    brief history    Patient Active Problem List   Diagnosis Date Noted  . Iliotibial band syndrome of right side 12/16/2017  . Depression 07/11/2017  . Intervertebral lumbar disc disorder with myelopathy, lumbar region 04/25/2017  . Tendinitis, de Quervain's 11/07/2016  . Gastroenteritis 09/13/2016  . Stress at home 09/13/2016  . Chronic anticoagulation 05/24/2015  . Medication intolerance 02/18/2015  . Coronary artery disease involving native coronary artery of native heart with angina pectoris ([place])  12/20/2014  . Iliotibial band syndrome of left side 11/16/2014  . Diverticulitis of colon 08/30/2014  . URI, acute 09/07/2013  . PAF (paroxysmal atrial fibrillation) ([place]) 11/21/2012  . Greater trochanteric bursitis of both hips 05/21/2012  . Strain of abdominal muscle 02/13/2012  . 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 Acute upper respiratory infection 09/20/2010  . Osteoarthritis of left hip 07/03/2010  . [employer] DISEASE, LUMBOSACRAL SPINE 05/18/2010  . TOBACCO USE, QUIT 12/01/2009  . SYNCOPE 10/27/2008  . Disorder resulting from impaired renal function 08/25/2008  . Hypotension 05/26/2008  . Diverticulosis of large intestine 05/21/2008  . IBS 05/21/2008  . Essential hypertension 06/16/2007  . GERD 06/16/2007  . COLONIC POLYPS, HX OF 06/16/2007    Past Surgical History:  Procedure Laterality Date  . ANTERIOR AND POSTERIOR VAGINAL REPAIR  01/2002   Archie Endo 01/23/2011  . APPENDECTOMY  1948  . CARDIAC CATHETERIZATION  06/26/2017  . CORONARY ANGIOPLASTY WITH STENT PLACEMENT  07/01/2017  . CORONARY ATHERECTOMY N/A 07/01/2017   Procedure: CORONARY ATHERECTOMY;  Surgeon: Belva Crome, MD;  Location: [facility];  Service: Cardiovascular;  Laterality: N/A;  . CORONARY STENT INTERVENTION N/A 07/01/2017   Procedure: CORONARY STENT INTERVENTION;  Surgeon: Belva Crome, MD;  Location: [facility];  Service: Cardiovascular;  Laterality: N/A;  . HAMMER TOE SURGERY    . HEMORRHOID BANDING    .  HIP SURGERY Left 04/2009   hip examination under anesthesia followed by greater trochanteric bursectomy; iliotibial band tenotomy/notes 01/20/2011  . KNEE BURSECTOMY Right 04/2009   Archie Endo 01/09/2011  . LEFT HEART CATH AND CORONARY ANGIOGRAPHY N/A 06/26/2017   Procedure: LEFT HEART CATH AND CORONARY ANGIOGRAPHY;  Surgeon: Belva Crome, MD;  Location: [facility];  Service: Cardiovascular;  Laterality: N/A;  . PUBOVAGINAL SLING  01/2002   Archie Endo 01/23/2011  .  REDUCTION MAMMAPLASTY    . TEMPORARY PACEMAKER N/A 07/01/2017   Procedure: TEMPORARY PACEMAKER;  Surgeon: Belva Crome, MD;  Location: [facility];  Service: Cardiovascular;  Laterality: N/A;  . VAGINAL HYSTERECTOMY  01/2002   Vaginal hysterectomy, bilateral salpingo-oophorectomy/notes 01/23/2011     OB History    Gravida  2   Para  2   Term  2   Preterm      AB      Living  1     SAB      TAB      Ectopic      Multiple      Live Births               Home Medications    Prior to Admission medications   Medication Sig Start Date End Date Taking? Authorizing Provider  apixaban (ELIQUIS) 5 MG TABS tablet Take 5 mg by mouth 2 (two) times daily.    [provider]  atorvastatin (LIPITOR) 80 MG tablet TAKE 1 TABLET AT 6PM. 12/18/17   Belva Crome, MD  cetirizine (ZYRTEC) 10 MG tablet Take 10 mg by mouth daily as needed for allergies or rhinitis.     [provider]  clobetasol cream (TEMOVATE) 0.05 % APPLY NIGHTLY AS DIRECTED. Patient taking differently: Apply 1 application topically daily as needed (irritation). APPLY NIGHTLY AS DIRECTED. 02/06/17   Fontaine, Belinda Block, MD  clopidogrel (PLAVIX) 75 MG tablet Take 1 tablet (75 mg total) by mouth daily. 06/27/17 06/27/18  Belva Crome, MD  diclofenac sodium (VOLTAREN) 1 % GEL APPLY 2 GRAMS TOPICALLY 4 TIMES DAILY. 07/11/17   Plotnikov, Evie Lacks, MD  diphenoxylate-atropine (LOMOTIL) 2.5-0.025 MG tablet Take 1 tablet by mouth 4 (four) times daily as needed for diarrhea or loose stools. 09/13/16   Plotnikov, Evie Lacks, MD  DULoxetine (CYMBALTA) 60 MG capsule TAKE (1) CAPSULE DAILY. 12/19/17   Plotnikov, Evie Lacks, MD  ketotifen (ZADITOR) 0.025 % ophthalmic solution Place 1 drop into both eyes daily as needed (ITCHING).    [provider]  linaclotide (LINZESS) 290 MCG CAPS capsule Take 290 mcg by mouth daily as needed (constipation).    [provider]  LORazepam (ATIVAN) 1 MG tablet  Take 1 mg by mouth 2 (two) times daily as needed for anxiety or sleep.    [provider]  metoprolol succinate (TOPROL-XL) 25 MG 24 hr tablet Take 3 tablets (75 mg total) by mouth daily. Take with or immediately following a meal. 11/06/17   Bhagat, Bhavinkumar, PA  nitroGLYCERIN (NITROSTAT) 0.4 MG SL tablet Place 0.4 mg under the tongue every 5 (five) minutes as needed for chest pain (Call 911 at 3rd dose within 15 minutes.).    [provider]  pantoprazole (PROTONIX) 40 MG tablet TAKE 1 TABLET BY MOUTH DAILY. 12/18/17   Belva Crome, MD  predniSONE (DELTASONE) 10 MG tablet Use as directed per doctors orders. 12/16/17   Thurman Coyer, DO  promethazine (PHENERGAN) 25 MG tablet Take 1  tablet (25 mg total) by mouth every 8 (eight) hours as needed for nausea or vomiting. 09/13/16   Plotnikov, Evie Lacks, MD  sodium chloride (OCEAN) 0.65 % SOLN nasal spray Place 1 spray into both nostrils 2 (two) times daily as needed for congestion. 07/06/16   Nche, Charlene Brooke, NP    Family History Family History  Problem Relation Age of Onset  . Colon cancer Mother   . Cancer Father        Prostate  . Diabetes Father   . Cancer Brother        Prostate, pancreas  . Cancer Son        Stomach  . Heart attack Neg Hx   . Stroke Neg Hx     Social History Social History   Tobacco Use  . Smoking status: Former Smoker    Packs/day: 0.25    Years: 28.00    Pack years: 7.00    Types: Cigarettes    Last attempt to quit: 1981    Years since quitting: 38.3  . Smokeless tobacco: Never Used  Substance Use Topics  . Alcohol use: Yes    Alcohol/week: 12.6 oz    Types: 7 Standard drinks or equivalent, 14 Shots of liquor per week    Comment: 07/01/2017 "couple shots/day"  . Drug use: No     Allergies   Macrobid [nitrofurantoin monohyd macro]; Meloxicam; Digoxin and related; and Sulfamethoxazole-trimethoprim   Review of Systems Review of Systems  All other systems reviewed and are  negative.    Physical Exam Updated Vital Signs BP (!) 171/91   Pulse (!) 49   Temp 98 F (36.7 C)   Resp 12   Ht 5\' 4"  (1.626 m)   Wt 75.8 kg (167 lb)   SpO2 99%   BMI 28.67 kg/m   Physical Exam  Constitutional: She is oriented to person, place, and time. She appears well-developed and well-nourished.  HENT:  Head: Normocephalic and atraumatic.  Eyes: Pupils are equal, round, and reactive to light. Conjunctivae and EOM are normal.  Neck: Normal range of motion and phonation normal. Neck supple.  Cardiovascular: Normal rate and regular rhythm.  Pulmonary/Chest: Effort normal and breath sounds normal. She exhibits no tenderness.  Abdominal: Soft. She exhibits no distension. There is no tenderness. There is no guarding.  Musculoskeletal:  Right knee moderate swelling anteriorly, bandage on wounds, which appear to have bleeding controlled.  Patient is able to extend the right knee off the stretcher and hold it, for 5 seconds.  No pain or tenderness and right hip or right ankle. Bandages of knee removed. There are multiple abrasions with some areas of full-thickness injury, and gaping of the wounds. Knee is very tender and it is difficult to assess the wounds. Patient refuses local anesthesia for wound management until her pain can be controlled.  Neurological: She is alert and oriented to person, place, and time. She exhibits normal muscle tone.  Skin: Skin is warm and dry.  Psychiatric: She has a normal mood and affect. Her behavior is normal. Judgment and thought content normal.  Nursing note and vitals reviewed.    ED Treatments / Results  Labs (all labs ordered are listed, but only abnormal results are displayed) Labs Reviewed - No data to display  EKG None  Radiology No results found.  Procedures Procedures (including critical care time)  Medications Ordered in ED Medications  Tdap (BOOSTRIX) injection 0.5 mL (has no administration in time range)  Initial  Impression / Assessment and Plan / ED Course  I have reviewed the triage vital signs and the nursing notes.  Pertinent labs & imaging results that were available during my care of the patient were reviewed by me and considered in my medical decision making (see chart for details).      Patient Vitals for the past 24 hrs:  BP Temp Pulse Resp SpO2 Height Weight  12/23/17 0245 - - (!) 48 11 93 % - -  12/23/17 0230 - - (!) 50 13 96 % - -  12/23/17 0030 (!) 177/92 - - - - - -  12/23/17 0015 - - (!) 52 14 98 % - -  12/23/17 0000 (!) 185/93 - - - - - -  12/22/17 2230 (!) 195/101 - (!) 50 14 97 % - -  12/22/17 2130 (!) 187/109 - (!) 54 15 100 % - -  12/22/17 2100 (!) 177/90 - (!) 47 13 100 % - -  12/22/17 2048 - - (!) 48 15 100 % - -  12/22/17 2015 - - (!) 47 13 95 % - -  12/22/17 2000 - - (!) 53 14 94 % - -  12/22/17 1925 (!) 171/91 98 F (36.7 C) (!) 49 12 99 % - -  12/22/17 1923 - - - - - 5\' 4"  (1.626 m) 75.8 kg (167 lb)  12/22/17 1920 - - - - 99 % - -    Care to oncoming provider team to assess and treat wounds, and evaluate knee further with advanced imaging.  MDM-patient evaluation consistent with mechanical fall.  Isolated right knee injury.  Injury consist of contusion, abrasions, and full-thickness skin injury.  Marked anterior knee swelling, with intact knee extension, on exam.  No apparent right hip or right ankle injury.  Patient with severe right knee pain that gradually worsened in the emergency department, the leg wound care treatment.  Final wound care deferred to oncoming provider team.  Nursing Notes Reviewed/ Care Coordinated Applicable Imaging Reviewed Interpretation of Laboratory Data incorporated into ED treatment  Plan-disposition following continued treatment in the ED, by oncoming team.    Final Clinical Impressions(s) / ED Diagnoses   Final diagnoses:  Quadriceps muscle rupture, right, initial encounter  Coagulopathy [facility]    ED Discharge Orders     None       Daleen Bo, MD 12/23/17 1329

## 2017-12-22 NOTE — ED Triage Notes (Signed)
Pt BIB [facility] for a mechanical fall. Pt was walking tripped and fell striking her right knee. Pt was able to get herself up and walk a few steps in which her knee gave out and she fell again. Obvious deformity per EMS leg is splinted and dressed. Pt states there is a skin tear on her knee. PMS intact and 279mcg fentynal given en route

## 2017-12-22 NOTE — ED Notes (Signed)
Patient transported to CT 

## 2017-12-23 MED ORDER — HYDROMORPHONE HCL 2 MG/ML IJ SOLN
0.5000 mg | Freq: Once | INTRAMUSCULAR | Status: AC
  Administered 2017-12-23: 0.5 mg via INTRAVENOUS
  Filled 2017-12-23: qty 1

## 2017-12-23 MED ORDER — OXYCODONE-ACETAMINOPHEN 5-325 MG PO TABS: 1.0000 | ORAL_TABLET | ORAL | 0 refills | Status: DC | PRN

## 2017-12-23 NOTE — Progress Notes (Signed)
Orthopedic Tech Progress Note Patient Details:  Hailey [name] Mar 07, 1935 [id number]  Ortho Devices Type of Ortho Device: Post (long leg) splint Ortho Device/Splint Location: rle Ortho Device/Splint Interventions: Ordered, Application, Adjustment   Post Interventions Patient Tolerated: Well Instructions Provided: Care of device, Adjustment of device   [name] 12/23/2017, 3:21 AM

## 2017-12-23 NOTE — Discharge Instructions (Addendum)
Follow up with Dr. Noemi Chapel this week for further management of partial quadriceps rupture. Remain non-weightbearing on the right leg until seen by orthopedics. Return to the ED as needed for new or concerning symptoms.

## 2017-12-24 ENCOUNTER — Emergency Department (HOSPITAL_COMMUNITY): Payer: Medicare Other

## 2017-12-24 ENCOUNTER — Encounter (HOSPITAL_COMMUNITY): Payer: Self-pay | Admitting: Emergency Medicine

## 2017-12-24 ENCOUNTER — Observation Stay (HOSPITAL_COMMUNITY): Payer: Medicare Other

## 2017-12-24 ENCOUNTER — Inpatient Hospital Stay (HOSPITAL_COMMUNITY)
Admission: EM | Admit: 2017-12-24 | Discharge: 2017-12-28 | DRG: 537 | Disposition: A | Discharge status: Skilled Nursing Facility | Payer: Medicare Other | Attending: Internal Medicine | Admitting: Internal Medicine

## 2017-12-24 DIAGNOSIS — M79604 Pain in right leg: Secondary | ICD-10-CM

## 2017-12-24 DIAGNOSIS — R55 Syncope and collapse: Secondary | ICD-10-CM

## 2017-12-24 DIAGNOSIS — N182 Chronic kidney disease, stage 2 (mild): Secondary | ICD-10-CM

## 2017-12-24 DIAGNOSIS — I480 Paroxysmal atrial fibrillation: Secondary | ICD-10-CM | POA: Diagnosis present

## 2017-12-24 DIAGNOSIS — I25119 Atherosclerotic heart disease of native coronary artery with unspecified angina pectoris: Secondary | ICD-10-CM

## 2017-12-24 DIAGNOSIS — I951 Orthostatic hypotension: Secondary | ICD-10-CM | POA: Diagnosis not present

## 2017-12-24 DIAGNOSIS — S76109A Unspecified injury of unspecified quadriceps muscle, fascia and tendon, initial encounter: Secondary | ICD-10-CM

## 2017-12-24 DIAGNOSIS — I10 Essential (primary) hypertension: Secondary | ICD-10-CM

## 2017-12-24 DIAGNOSIS — I4510 Unspecified right bundle-branch block: Secondary | ICD-10-CM | POA: Diagnosis present

## 2017-12-24 DIAGNOSIS — I959 Hypotension, unspecified: Secondary | ICD-10-CM | POA: Diagnosis present

## 2017-12-24 DIAGNOSIS — Z7901 Long term (current) use of anticoagulants: Secondary | ICD-10-CM

## 2017-12-24 DIAGNOSIS — L03115 Cellulitis of right lower limb: Secondary | ICD-10-CM | POA: Diagnosis present

## 2017-12-24 DIAGNOSIS — S76111D Strain of right quadriceps muscle, fascia and tendon, subsequent encounter: Secondary | ICD-10-CM | POA: Diagnosis not present

## 2017-12-24 DIAGNOSIS — S76111A Strain of right quadriceps muscle, fascia and tendon, initial encounter: Secondary | ICD-10-CM | POA: Diagnosis not present

## 2017-12-24 DIAGNOSIS — D62 Acute posthemorrhagic anemia: Secondary | ICD-10-CM | POA: Diagnosis present

## 2017-12-24 DIAGNOSIS — R001 Bradycardia, unspecified: Secondary | ICD-10-CM | POA: Diagnosis not present

## 2017-12-24 DIAGNOSIS — N1832 Chronic kidney disease, stage 3b: Secondary | ICD-10-CM

## 2017-12-24 DIAGNOSIS — I4821 Permanent atrial fibrillation: Secondary | ICD-10-CM | POA: Diagnosis present

## 2017-12-24 DIAGNOSIS — S76119A Strain of unspecified quadriceps muscle, fascia and tendon, initial encounter: Secondary | ICD-10-CM

## 2017-12-24 HISTORY — DX: Bradycardia, unspecified: R00.1

## 2017-12-24 HISTORY — DX: Chronic kidney disease, stage 2 (mild): N18.2

## 2017-12-24 HISTORY — DX: Unspecified right bundle-branch block: I45.10

## 2017-12-24 HISTORY — DX: Cellulitis of right lower limb: L03.115

## 2017-12-24 HISTORY — DX: Acute posthemorrhagic anemia: D62

## 2017-12-24 LAB — COMPREHENSIVE METABOLIC PANEL
ALT: 14 U/L (ref 14–54)
AST: 19 U/L (ref 15–41)
Albumin: 3.4 g/dL — ABNORMAL LOW (ref 3.5–5.0)
Alkaline Phosphatase: 52 U/L (ref 38–126)
Anion gap: 11 (ref 5–15)
BUN: 24 mg/dL — ABNORMAL HIGH (ref 6–20)
CO2: 20 mmol/L — ABNORMAL LOW (ref 22–32)
Calcium: 8.7 mg/dL — ABNORMAL LOW (ref 8.9–10.3)
Chloride: 105 mmol/L (ref 101–111)
Creatinine, Ser: 1.23 mg/dL — ABNORMAL HIGH (ref 0.44–1.00)
GFR calc Af Amer: 46 mL/min — ABNORMAL LOW (ref >60)
GFR calc non Af Amer: 40 mL/min — ABNORMAL LOW (ref >60)
Glucose, Bld: 112 mg/dL — ABNORMAL HIGH (ref 65–99)
Potassium: 3.6 mmol/L (ref 3.5–5.1)
Sodium: 136 mmol/L (ref 135–145)
Total Bilirubin: 0.7 mg/dL (ref 0.3–1.2)
Total Protein: 5.7 g/dL — ABNORMAL LOW (ref 6.5–8.1)

## 2017-12-24 LAB — CBC WITH DIFFERENTIAL/PLATELET
Basophils Absolute: 0 10*3/uL (ref 0.0–0.1)
Basophils Relative: 0 %
Eosinophils Absolute: 0.2 10*3/uL (ref 0.0–0.7)
Eosinophils Relative: 3 %
HCT: 28.5 % — ABNORMAL LOW (ref 36.0–46.0)
Hemoglobin: 9.1 g/dL — ABNORMAL LOW (ref 12.0–15.0)
Lymphocytes Relative: 14 %
Lymphs Abs: 1.3 10*3/uL (ref 0.7–4.0)
MCH: 28.2 pg (ref 26.0–34.0)
MCHC: 31.9 g/dL (ref 30.0–36.0)
MCV: 88.2 fL (ref 78.0–100.0)
Monocytes Absolute: 1.1 10*3/uL — ABNORMAL HIGH (ref 0.1–1.0)
Monocytes Relative: 12 %
Neutro Abs: 6.6 10*3/uL (ref 1.7–7.7)
Neutrophils Relative %: 71 %
Platelets: 265 10*3/uL (ref 150–400)
RBC: 3.23 MIL/uL — ABNORMAL LOW (ref 3.87–5.11)
RDW: 16.3 % — ABNORMAL HIGH (ref 11.5–15.5)
WBC: 9.2 10*3/uL (ref 4.0–10.5)

## 2017-12-24 LAB — TROPONIN I: Troponin I: 0.03 ng/mL

## 2017-12-24 MED ORDER — METOPROLOL SUCCINATE ER 50 MG PO TB24
75.0000 mg | ORAL_TABLET | Freq: Every day | ORAL | Status: DC
  Administered 2017-12-24 – 2017-12-28 (×5): 75 mg via ORAL
  Filled 2017-12-24 (×5): qty 1

## 2017-12-24 MED ORDER — PANTOPRAZOLE SODIUM 40 MG PO TBEC
40.0000 mg | DELAYED_RELEASE_TABLET | Freq: Every day | ORAL | Status: DC
  Administered 2017-12-25 – 2017-12-28 (×5): 40 mg via ORAL
  Filled 2017-12-24 (×5): qty 1

## 2017-12-24 MED ORDER — CEFAZOLIN SODIUM-DEXTROSE 2-4 GM/100ML-% IV SOLN
2.0000 g | Freq: Three times a day (TID) | INTRAVENOUS | Status: DC
  Administered 2017-12-24 – 2017-12-27 (×8): 2 g via INTRAVENOUS
  Filled 2017-12-24 (×10): qty 100

## 2017-12-24 MED ORDER — DOCUSATE SODIUM 100 MG PO CAPS
100.0000 mg | ORAL_CAPSULE | Freq: Two times a day (BID) | ORAL | Status: DC
  Administered 2017-12-24 – 2017-12-28 (×8): 100 mg via ORAL
  Filled 2017-12-24 (×8): qty 1

## 2017-12-24 MED ORDER — METHOCARBAMOL 1000 MG/10ML IJ SOLN
500.0000 mg | Freq: Four times a day (QID) | INTRAMUSCULAR | Status: DC | PRN
  Filled 2017-12-24: qty 5

## 2017-12-24 MED ORDER — SODIUM CHLORIDE 0.9 % IV SOLN
INTRAVENOUS | Status: DC
  Administered 2017-12-24 (×2): via INTRAVENOUS

## 2017-12-24 MED ORDER — CLOPIDOGREL BISULFATE 75 MG PO TABS
75.0000 mg | ORAL_TABLET | Freq: Every day | ORAL | Status: DC
  Administered 2017-12-25 – 2017-12-28 (×4): 75 mg via ORAL
  Filled 2017-12-24 (×4): qty 1

## 2017-12-24 MED ORDER — OXYCODONE-ACETAMINOPHEN 5-325 MG PO TABS
1.0000 | ORAL_TABLET | ORAL | Status: DC | PRN
Start: 2017-12-24 — End: 2017-12-27
  Administered 2017-12-24: 1 via ORAL
  Administered 2017-12-25 – 2017-12-26 (×6): 2 via ORAL
  Filled 2017-12-24 (×6): qty 2
  Filled 2017-12-24: qty 1

## 2017-12-24 MED ORDER — ATORVASTATIN CALCIUM 80 MG PO TABS
80.0000 mg | ORAL_TABLET | Freq: Every day | ORAL | Status: DC
  Administered 2017-12-24 – 2017-12-26 (×3): 80 mg via ORAL
  Filled 2017-12-24 (×4): qty 1

## 2017-12-24 MED ORDER — METHOCARBAMOL 500 MG PO TABS
500.0000 mg | ORAL_TABLET | Freq: Four times a day (QID) | ORAL | Status: DC | PRN
  Administered 2017-12-25 – 2017-12-28 (×3): 500 mg via ORAL
  Filled 2017-12-24 (×3): qty 1

## 2017-12-24 MED ORDER — SODIUM CHLORIDE 0.9 % IV SOLN: INTRAVENOUS | Status: DC

## 2017-12-24 MED ORDER — LORAZEPAM 1 MG PO TABS
1.0000 mg | ORAL_TABLET | Freq: Two times a day (BID) | ORAL | Status: DC | PRN
  Administered 2017-12-24 – 2017-12-27 (×2): 1 mg via ORAL
  Filled 2017-12-24 (×2): qty 1

## 2017-12-24 MED ORDER — DULOXETINE HCL 60 MG PO CPEP
60.0000 mg | ORAL_CAPSULE | Freq: Every day | ORAL | Status: DC
  Administered 2017-12-24 – 2017-12-28 (×5): 60 mg via ORAL
  Filled 2017-12-24 (×5): qty 1

## 2017-12-24 MED ORDER — SODIUM CHLORIDE 0.9 % IV SOLN
INTRAVENOUS | Status: DC
  Administered 2017-12-24: 13:00:00 via INTRAVENOUS

## 2017-12-24 MED ORDER — MORPHINE SULFATE (PF) 4 MG/ML IV SOLN: 2.0000 mg | INTRAVENOUS | Status: DC | PRN

## 2017-12-24 NOTE — Consult Note (Addendum)
Cardiology Consultation:   Patient ID: Hailey [name]; [id number]; [birth year]   Admit date: 12/24/2017 Date of Consult: 12/24/2017  Primary Care Provider: Cassandria Anger, MD Primary Cardiologist: Dr. Daneen Schick   Patient Profile:   Hailey [name] is a 83 y.o. female with a hx of PSVT, paroxysmal atrial fibrillation on chronic anticoagulation,CAD with RCA stent after atherectomy 06/2017,chronic kidney disease stage II and hypertension who is being seen today for pre-surgical clearance at the request of Dr. Vanita Panda.   History of Present Illness:   Hailey [name] is a 83 y.o. female with hx as stated above of who is needing surgical clearance for right leg quadriceps tendon rupture which was sustained secondary to a witnessed (without LOS) mechanical fall on Sunday 12/22/17. Pt states that she was moving clothes around in her room and tripped on the corner of a table. Due to her injuries, she proceeded to the ED where she was found to have a right quadriceps tear. She was then set up to see ortho MD at [facility] on Monday 12/23/17. EMS was called for transport to the ED for admission and surgical intervention planned for 12/25/17.   On arrival to the ED, her BP's have been WNL (reports of low BP's with EMS). She denies dizziness, syncope or other associated symptoms. Denies chest pain, palpitations, orthopnea, PND or fatigue. Pt has a hx of CKD stage II with creatinine ranging from 1.2-1.3 at baseline. Troponin on arrival was 0.03. EKG with rate controlled AF with RBBB and no ST-T wave changes.   Given her past cardiac history of AF and prior stenting (2018) the patient has been on Plavix and Eliquis which complicates her case to some degree due to potential bleeding.   According to Revised Cardiac Risk Index calculation, the pt is considered a Class II risk, with a 0.9% risk of major cardiac event during the procedure. Her Duke Activity Index score was calculated at 5.62 METS  and 18.95 points based on answers to ADL/IADL activity questions indicating good functional pre-operative capacity.   Of note, she was remotely seen in 2018 when the patient notedextreme fatigue, lightheadedness and weakness and palpitations. She was initially referred for cardioversion, but in light of past abnormal Myoview and coronary calcification seen on prior imaging, a cardiac cath was recommended. This was completed on 06/26/17 and revealed severe 3-vessel coronary calcification, particularly in the RCA ( 85-90% proximal followed by generalized 50-60% mid RCA calcified stenosis) with 25% distal left main and normal LVEF function of 60%. Now s/p athrectomy of the RCA (07/01/17), she was inially on triple therapy anticoagulation for one month post intervention, then aspirin was stopped and DAPT was continued.  Past Medical History:  Diagnosis Date  . Allergic rhinitis   . Anxiety   . Arthritis    "my whole spine" (07/01/2017)  . Atrial fibrillation ([place])   . CKD (chronic kidney disease) stage 2, GFR 60-89 ml/min   . Coronary artery disease    10/18 PCI/DES to p/m LCx with cutting balloon to mLcx  . Diverticulosis of colon   . GERD (gastroesophageal reflux disease)   . Hip bursitis 2010   Dr Para March, Post op seroma  . History of colon polyps   . HTN (hypertension)   . IBS (irritable bowel syndrome)    constipation predominant - Dr Earlean Shawl  . Lichen sclerosus   . Osteopenia 11/2016   T score -2.0 FRAX 15%/4.3%  . PAC (premature atrial contraction)  Symptomatiic  . Scoliosis   . SVT (supraventricular tachycardia) ([place])    brief history    Past Surgical History:  Procedure Laterality Date  . ANTERIOR AND POSTERIOR VAGINAL REPAIR  01/2002   Archie Endo 01/23/2011  . APPENDECTOMY  1948  . CARDIAC CATHETERIZATION  06/26/2017  . CORONARY ANGIOPLASTY WITH STENT PLACEMENT  07/01/2017  . CORONARY ATHERECTOMY N/A 07/01/2017   Procedure: CORONARY ATHERECTOMY;  Surgeon: Belva Crome,  MD;  Location: [facility];  Service: Cardiovascular;  Laterality: N/A;  . CORONARY STENT INTERVENTION N/A 07/01/2017   Procedure: CORONARY STENT INTERVENTION;  Surgeon: Belva Crome, MD;  Location: [facility];  Service: Cardiovascular;  Laterality: N/A;  . HAMMER TOE SURGERY    . HEMORRHOID BANDING    . HIP SURGERY Left 04/2009   hip examination under anesthesia followed by greater trochanteric bursectomy; iliotibial band tenotomy/notes 01/20/2011  . KNEE BURSECTOMY Right 04/2009   Archie Endo 01/09/2011  . LEFT HEART CATH AND CORONARY ANGIOGRAPHY N/A 06/26/2017   Procedure: LEFT HEART CATH AND CORONARY ANGIOGRAPHY;  Surgeon: Belva Crome, MD;  Location: [facility];  Service: Cardiovascular;  Laterality: N/A;  . PUBOVAGINAL SLING  01/2002   Archie Endo 01/23/2011  . REDUCTION MAMMAPLASTY    . TEMPORARY PACEMAKER N/A 07/01/2017   Procedure: TEMPORARY PACEMAKER;  Surgeon: Belva Crome, MD;  Location: [facility];  Service: Cardiovascular;  Laterality: N/A;  . VAGINAL HYSTERECTOMY  01/2002   Vaginal hysterectomy, bilateral salpingo-oophorectomy/notes 01/23/2011     Prior to Admission medications   Medication Sig Start Date End Date Taking? Authorizing Provider  apixaban (ELIQUIS) 5 MG TABS tablet Take 5 mg by mouth 2 (two) times daily.    [provider]  atorvastatin (LIPITOR) 80 MG tablet TAKE 1 TABLET AT 6PM. 12/18/17   Belva Crome, MD  cetirizine (ZYRTEC) 10 MG tablet Take 10 mg by mouth daily as needed for allergies or rhinitis.     [provider]  clobetasol cream (TEMOVATE) 0.05 % APPLY NIGHTLY AS DIRECTED. Patient taking differently: Apply 1 application topically daily as needed (irritation). APPLY NIGHTLY AS DIRECTED. 02/06/17   Fontaine, Belinda Block, MD  clopidogrel (PLAVIX) 75 MG tablet Take 1 tablet (75 mg total) by mouth daily. 06/27/17 06/27/18  Belva Crome, MD  diclofenac sodium (VOLTAREN) 1 % GEL APPLY 2 GRAMS TOPICALLY 4 TIMES  DAILY. 07/11/17   Plotnikov, Evie Lacks, MD  diphenoxylate-atropine (LOMOTIL) 2.5-0.025 MG tablet Take 1 tablet by mouth 4 (four) times daily as needed for diarrhea or loose stools. 09/13/16   Plotnikov, Evie Lacks, MD  DULoxetine (CYMBALTA) 60 MG capsule TAKE (1) CAPSULE DAILY. 12/19/17   Plotnikov, Evie Lacks, MD  ketotifen (ZADITOR) 0.025 % ophthalmic solution Place 1 drop into both eyes daily as needed (ITCHING).    [provider]  linaclotide (LINZESS) 290 MCG CAPS capsule Take 290 mcg by mouth daily as needed (constipation).    [provider]  LORazepam (ATIVAN) 1 MG tablet Take 1 mg by mouth 2 (two) times daily as needed for anxiety or sleep.    [provider]  metoprolol succinate (TOPROL-XL) 25 MG 24 hr tablet Take 3 tablets (75 mg total) by mouth daily. Take with or immediately following a meal. 11/06/17   Bhagat, Bhavinkumar, PA  nitroGLYCERIN (NITROSTAT) 0.4 MG SL tablet Place 0.4 mg under the tongue every 5 (five) minutes as needed for chest pain (Call 911 at 3rd dose within 15 minutes.).  [provider]  oxyCODONE-acetaminophen (PERCOCET/ROXICET) 5-325 MG tablet Take 1-2 tablets by mouth every 4 (four) hours as needed for severe pain. 12/23/17   Charlann Lange, PA-C  pantoprazole (PROTONIX) 40 MG tablet TAKE 1 TABLET BY MOUTH DAILY. 12/18/17   Belva Crome, MD  predniSONE (DELTASONE) 10 MG tablet Use as directed per doctors orders. Patient taking differently: Take 10 mg by mouth See admin instructions. 10 mg 6 day dose pack- pt on day 5 of therapy 12/16/17   Thurman Coyer, DO  promethazine (PHENERGAN) 25 MG tablet Take 1 tablet (25 mg total) by mouth every 8 (eight) hours as needed for nausea or vomiting. Patient not taking: Reported on 12/22/2017 09/13/16   Plotnikov, Evie Lacks, MD  sodium chloride (OCEAN) 0.65 % SOLN nasal spray Place 1 spray into both nostrils 2 (two) times daily as needed for congestion. 07/06/16   NcheCharlene Brooke, NP     Inpatient Medications: Scheduled Meds:  Continuous Infusions: . sodium chloride 125 mL/hr at 12/24/17 1300  . sodium chloride     PRN Meds:   Allergies:    Allergies  Allergen Reactions  . Macrobid [employer] Macro] Other (See Comments)    Nausea, stomach cramps, fatigue , headache.  . Meloxicam Other (See Comments)    Jittery and headache  . Digoxin And Related     headaches  . Sulfamethoxazole-Trimethoprim Nausea Only    Social History:   Social History   Socioeconomic History  . Marital status: Married    Spouse name: Not on file  . Number of children: 2  . Years of education: Not on file  . Highest education level: Not on file  Occupational History  . Occupation: Patent attorney: RETIRED  Social Needs  . Financial resource strain: Not on file  . Food insecurity:    Worry: Not on file    Inability: Not on file  . Transportation needs:    Medical: Not on file    Non-medical: Not on file  Tobacco Use  . Smoking status: Former Smoker    Packs/day: 0.25    Years: 28.00    Pack years: 7.00    Types: Cigarettes    Last attempt to quit: 1981    Years since quitting: 38.3  . Smokeless tobacco: Never Used  Substance and Sexual Activity  . Alcohol use: Yes    Alcohol/week: 12.6 oz    Types: 7 Standard drinks or equivalent, 14 Shots of liquor per week    Comment: 07/01/2017 "couple shots/day"  . Drug use: No  . Sexual activity: Yes    Birth control/protection: Surgical    Comment: 1st intercourse 83 yo--Fewer than 5 partners  Lifestyle  . Physical activity:    Days per week: Not on file    Minutes per session: Not on file  . Stress: Not on file  Relationships  . Social connections:    Talks on phone: Not on file    Gets together: Not on file    Attends religious service: Not on file    Active member of club or organization: Not on file    Attends meetings of clubs or organizations: Not on file    Relationship status: Not on  file  . Intimate partner violence:    Fear of current or ex partner: Not on file    Emotionally abused: Not on file    Physically abused: Not on file    Forced sexual activity: Not  on file  Other Topics Concern  . Not on file  Social History Narrative   Regular Exercise -  YES          Family History:   Family History  Problem Relation Age of Onset  . Colon cancer Mother   . Cancer Father        Prostate  . Diabetes Father   . Cancer Brother        Prostate, pancreas  . Cancer Son        Stomach  . Heart attack Neg Hx   . Stroke Neg Hx    Family Status:  Family Status  Relation Name Status  . Mother  Deceased  . Father  Deceased  . Brother  Deceased  . MGM  Deceased  . MGF  Deceased  . PGM  Deceased  . PGF  Deceased  . Son  (Not Specified)  . Neg Hx  (Not Specified)    ROS:  Please see the history of present illness.  All other ROS reviewed and negative.     Physical Exam/Data:   Vitals:   12/24/17 1345 12/24/17 1400 12/24/17 1445 12/24/17 1515  BP: (!) 110/97 126/78 (!) 128/98 112/70  Pulse:      Resp: 13 17 16 13   Temp:      TempSrc:      SpO2:       No intake or output data in the 24 hours ending 12/24/17 1523 There were no vitals filed for this visit. There is no height or weight on file to calculate BMI.   General: Well developed, well nourished, NAD Skin: Warm, dry, intact  Head: Normocephalic, atraumatic, clear, moist mucus membranes. Neck: Negative for carotid bruits. No JVD Lungs:Clear to ausculation bilaterally. No wheezes, rales, or rhonchi. Breathing is unlabored. Cardiovascular:Irregularly irregular with S1 S2. No murmurs, rubs, gallops, or LV heave appreciated. Abdomen: Soft, non-tender, non-distended with normoactive bowel sounds. No obvious abdominal masses. MSK: Strength and tone appear normal for age. 5/5 in all extremities Extremities: No edema. No clubbing or cyanosis. DP/PT pulses 2+ bilaterally Neuro: Alert and oriented. No  focal deficits. No facial asymmetry. MAE spontaneously. Psych: Responds to questions appropriately with normal affect.     EKG:  The EKG was personally reviewed and demonstrates: 12/24/17 Atrial fibrillation with RBBB HR 79 Telemetry:  Telemetry was personally reviewed and demonstrates: 12/24/17 Atrial fibrillation HR 92  Relevant CV Studies:  Echocardiogram: 09/16/15 Study Conclusions  - Left ventricle: The cavity size was normal. Wall thickness was normal. Systolic function was normal. The estimated ejection fraction was in the range of 55% to 60%. Wall motion was normal; there were no regional wall motion abnormalities. - Mitral valve: There was mild regurgitation. - Left atrium: The atrium was mildly dilated. - Right atrium: The atrium was mildly dilated. - Tricuspid valve: There was mild-moderate regurgitation directed centrally. - Pulmonary arteries: Systolic pressure was mildly increased. PA peak pressure: 38 mm Hg (S).   CATH:   Cath 06/26/17:  Severe diffuse three-vessel coronary calcification, particularly have in the right coronary artery.  85-90% proximal followed by generalized 50-60% mid RCA calcified stenosis.  Distal left main 25%. Heavily calcified.  Irregularities in the proximal and mid LAD up to 50% . Heavily calcified.  Circumflex artery is relatively small and is free of any significant obstruction.   Normal left ventricular systolic function with normal left ventricular end-diastolic pressure. EF 60%.  RECOMMENDATIONS:  Plavix is started today.  Resume Eliquis in  a.m. Discontinue eloquent's after p.m. dose on Friday.  Orbital atherectomy will be planned for 07/01/2017 probably from right femoral approach with temporary pacemaker insertion to avoid bradycardia.   Cath 07/01/17:   Successful PCI with Orbital atherectomy followed by stenting of a heavily calcified right coronary reducing a proximal to mid 85% stenosis to 0% with  with TIMI grade 3 flow. An Onyx 4.0 x 26 mm DES was post.-dilated to 4.5 mm in diameter.  RECOMMENDATIONS:   Aspirin, Plavix, and Eliquis for 1 month then drop aspirin.  Plavix and Eliquis x 5 months then drop plavix.  Resume Eliquis in AM 07/02/2017.    Laboratory Data:  Chemistry Recent Labs  Lab 12/24/17 1207  NA 136  K 3.6  CL 105  CO2 20*  GLUCOSE 112*  BUN 24*  CREATININE 1.23*  CALCIUM 8.7*  GFRNONAA 40*  GFRAA 46*  ANIONGAP 11    Total Protein  Date Value Ref Range Status  12/24/2017 5.7 (L) 6.5 - 8.1 g/dL Final   Albumin  Date Value Ref Range Status  12/24/2017 3.4 (L) 3.5 - 5.0 g/dL Final   AST  Date Value Ref Range Status  12/24/2017 19 15 - 41 U/L Final   ALT  Date Value Ref Range Status  12/24/2017 14 14 - 54 U/L Final   Alkaline Phosphatase  Date Value Ref Range Status  12/24/2017 52 38 - 126 U/L Final   Total Bilirubin  Date Value Ref Range Status  12/24/2017 0.7 0.3 - 1.2 mg/dL Final   Hematology Recent Labs  Lab 12/24/17 1207  WBC 9.2  RBC 3.23*  HGB 9.1*  HCT 28.5*  MCV 88.2  MCH 28.2  MCHC 31.9  RDW 16.3*  PLT 265   Cardiac Enzymes Recent Labs  Lab 12/24/17 1207  TROPONINI 0.03*   No results for input(s): TROPIPOC in the last 168 hours.  BNPNo results for input(s): BNP, PROBNP in the last 168 hours.  DDimer No results for input(s): DDIMER in the last 168 hours. TSH:  Lab Results  Component Value Date   TSH 2.11 04/29/2015   Lipids: Lab Results  Component Value Date   CHOL 171 11/26/2016   HDL 90 11/26/2016   LDLCALC 61 11/26/2016   LDLDIRECT 89.1 06/29/2013   TRIG 99 11/26/2016   CHOLHDL 1.9 11/26/2016   HgbA1c: Lab Results  Component Value Date   HGBA1C 5.6 07/04/2014    Radiology/Studies:  Dg Chest 2 View  Result Date: 12/24/2017 CLINICAL DATA:  Syncope.  Preop for ruptured quadriceps. EXAM: CHEST - 2 VIEW COMPARISON:  07/03/2014 FINDINGS: Stable borderline heart size and mild aortic  tortuosity. There is no edema, consolidation, effusion, or pneumothorax. Spondylosis. IMPRESSION: No evidence of active disease. Electronically Signed   By: Monte Fantasia M.D.   On: 12/24/2017 13:02   Ct Knee Right Wo Contrast  Result Date: 12/23/2017 CLINICAL DATA:  Status post fall, with injury to the right knee. Initial encounter. EXAM: CT OF THE RIGHT KNEE WITHOUT CONTRAST TECHNIQUE: Multidetector CT imaging of the right knee was performed according to the standard protocol. Multiplanar CT image reconstructions were also generated. COMPARISON:  Right knee radiographs performed earlier today at 7:42 p.m. FINDINGS: Bones/Joint/Cartilage There is no evidence of fracture or dislocation. Visualized joint spaces are grossly preserved. No knee joint effusion is seen. The cartilage is not well assessed on CT. Ligaments The anterior and posterior cruciate ligaments appear grossly intact. The medial collateral ligament and lateral collateral ligament complex are grossly unremarkable.  Muscles and Tendons There appears to be partial disruption of the distal aspect of the quadriceps, with a large 8.9 x 4.6 x 7.2 cm hematoma anterior to the patella, with fluid-fluid levels. Remaining musculature is grossly unremarkable in appearance. Soft tissues Soft tissue injury is noted tracking about the knee. Scattered vascular calcifications are seen. IMPRESSION: 1. No evidence of fracture or dislocation. 2. Partial disruption of the distal aspect of the quadriceps muscle, with a large 8.9 cm hematoma anterior to the patella, with fluid-fluid levels. Surrounding soft tissue injury noted tracking about the knee. 3. Scattered vascular calcifications. Electronically Signed   By: Garald Balding M.D.   On: 12/23/2017 00:47   Dg Knee Complete 4 Views Right  Result Date: 12/22/2017 CLINICAL DATA:  Fall with bleeding and swelling EXAM: RIGHT KNEE - COMPLETE 4+ VIEW COMPARISON:  11/27/2013 FINDINGS: No acute displaced fracture or  malalignment is seen. Joint space calcifications. No large knee fusion. Large amount of prepatellar soft tissue swelling. Mild degenerative changes of the medial joint space. IMPRESSION: Large amount of prepatellar soft tissue swelling. No acute osseous abnormality. Chondrocalcinosis. Electronically Signed   By: Donavan Foil M.D.   On: 12/22/2017 19:59   Assessment and Plan:   1.Hx of CAD s/p PCI to RCA in 06/2017: -Denies chest pain or palpitations -Has had no cardiac symptoms since last year. Pt went through three months of cardiac rehabilitation, completed 03/7823 without complication   -Remains on Plavix post PCI since 06/2017 -MD spoke with Ortho PA who states they are able to operative with Plavix onboard. We will plan on this route given her cardiac hx of PCI 6 months prior -Will continue Plavix, stop Eliquis (last dose yesterday 12/23/17) and resume 2 days post-operatively at low dose 2.5mg  PO BID for two weeks, then resume full dose thereafter.   2. Hx of atrial fibrillation on chronic anticoagulation: -No hx of prior stroke -Currently in atrial fibrillation with good rate control -Per patient, last Eliquis dose was yesterday evening, 12/23/17 -Will restart Eliquis two days post-op at low dose 2.5mg  PO BID for two weeks, then resume full dose thereafter  3. Right quadricept tendon tear: -MD spoke with Ortho PA who states that they are able to operative while pt is on Plavix -Will continue Plavix throughout this course and monitor for post op bleeding complications -Per primary team   4. CKD stage II: -Baseline creatinine 1.2-1.3 -Creatinine today 1.23 -BMET in AM per admitting team    For questions or updates, please contact [place] Please consult [email or web address] for contact info under Cardiology/STEMI.   SignedKathyrn Drown NP-C [facility] Pager: [phone] 12/24/2017 3:23 PM   Agree with note by Kathyrn Drown NP  We're asked toe orthopedic surgery for a torn  quadriceps tendon. She has a history of RCA orbital rotational atherectomy, PCI drug-eluting stenting October of last year She is on Eliquis  for A. Fib and was on triple therapy for several months and Plavix only along with Eliquis since January.she has had no chest pain since her procedure.I discussed the case with the orthopedic PA and they feel that they can do the procedure well on Plavix. Her last dose of Eliquis t was yesterday. Plan will be to operate tomorrow and restart, Eliquis postop.   Lorretta Harp, M.D., [place], [facility], [place] [address] [place], [place]  [id number]  [phone] 12/24/2017 5:00 PM

## 2017-12-24 NOTE — H&P (Signed)
History and Physical    Hailey [name] SWN:[id number] DOB: Nov 01, 1934 DOA: 12/24/2017  PCP: Cassandria Anger, MD Consultants:  Tamala Julian - cardiology; Noemi Chapel - orthopedics Patient coming from:  Home - lives with husband; NOK: Husband, [phone]  Chief Complaint:  Pre-op  HPI: Hailey [name] is a 83 y.o. female with medical history significant of HTN; CAD on Plavix; CKD; and afib on Eliquis presenting with a quadriceps tear from a fall.   She was doing some cleaning in her room and she tripped over a table.  She is on Eliquis and Plavix and was bleeding a lot.  She was able to walk afterwards but the pain and swelling started within 30 minutes afterward.  The fall was Sunday and she was brought to the ER that night.  She was found to have a partial rupture of the distal quadriceps muscle and large 8.9 cm prepatellar hematoma and was discharged with a posterior splint/knee immobilizer and waited to be seen by orthopedics today. It was very difficult to move her, so they had to call an ambulance service to transport her.  Dr. Noemi Chapel then told them that she needs a tendon repair tomorrow.  She has been cleared for surgery by cardiology.  Her last dose of Eliquis was yesterday AM.   She also got lightheaded at the orthopedic office - she was dehydrated and on pain killers and she was quickly moved from a lying to seated position.  She may have passed out for a moment.  This is not a new issue, according to the patient and her husband.      ED Course:  Pre-op check, cardiology cleared and will follow.  Afib, on Eliquis and Plavix.  Quadriceps tendon rupture, surgery tomorrow.  TRH to watch anemia, afib.    Review of Systems: As per HPI; otherwise review of systems reviewed and negative.   Ambulatory Status:  Ambulates without assistance  Past Medical History:  Diagnosis Date  . Allergic rhinitis   . Anxiety   . Arthritis    "my whole spine" (07/01/2017)  . Atrial fibrillation ([place])   . CKD  (chronic kidney disease) stage 2, GFR 60-89 ml/min   . Coronary artery disease    10/18 PCI/DES to p/m LCx with cutting balloon to mLcx  . Diverticulosis of colon   . GERD (gastroesophageal reflux disease)   . Hip bursitis 2010   Dr Para March, Post op seroma  . History of colon polyps   . HTN (hypertension)   . IBS (irritable bowel syndrome)    constipation predominant - Dr Earlean Shawl  . Lichen sclerosus   . Osteopenia 11/2016   T score -2.0 FRAX 15%/4.3%  . PAC (premature atrial contraction)    Symptomatiic  . Scoliosis   . SVT (supraventricular tachycardia) ([place])    brief history    Past Surgical History:  Procedure Laterality Date  . ANTERIOR AND POSTERIOR VAGINAL REPAIR  01/2002   Archie Endo 01/23/2011  . APPENDECTOMY  1948  . CARDIAC CATHETERIZATION  06/26/2017  . CORONARY ANGIOPLASTY WITH STENT PLACEMENT  07/01/2017  . CORONARY ATHERECTOMY N/A 07/01/2017   Procedure: CORONARY ATHERECTOMY;  Surgeon: Belva Crome, MD;  Location: [facility];  Service: Cardiovascular;  Laterality: N/A;  . CORONARY STENT INTERVENTION N/A 07/01/2017   Procedure: CORONARY STENT INTERVENTION;  Surgeon: Belva Crome, MD;  Location: [facility];  Service: Cardiovascular;  Laterality: N/A;  . HAMMER TOE SURGERY    . HEMORRHOID BANDING    .  HIP SURGERY Left 04/2009   hip examination under anesthesia followed by greater trochanteric bursectomy; iliotibial band tenotomy/notes 01/20/2011  . KNEE BURSECTOMY Right 04/2009   Archie Endo 01/09/2011  . LEFT HEART CATH AND CORONARY ANGIOGRAPHY N/A 06/26/2017   Procedure: LEFT HEART CATH AND CORONARY ANGIOGRAPHY;  Surgeon: Belva Crome, MD;  Location: [facility];  Service: Cardiovascular;  Laterality: N/A;  . PUBOVAGINAL SLING  01/2002   Archie Endo 01/23/2011  . REDUCTION MAMMAPLASTY    . TEMPORARY PACEMAKER N/A 07/01/2017   Procedure: TEMPORARY PACEMAKER;  Surgeon: Belva Crome, MD;  Location: [facility];  Service: Cardiovascular;   Laterality: N/A;  . VAGINAL HYSTERECTOMY  01/2002   Vaginal hysterectomy, bilateral salpingo-oophorectomy/notes 01/23/2011    Social History   Socioeconomic History  . Marital status: Married    Spouse name: Not on file  . Number of children: 2  . Years of education: Not on file  . Highest education level: Not on file  Occupational History  . Occupation: Patent attorney: RETIRED  Social Needs  . Financial resource strain: Not on file  . Food insecurity:    Worry: Not on file    Inability: Not on file  . Transportation needs:    Medical: Not on file    Non-medical: Not on file  Tobacco Use  . Smoking status: Former Smoker    Packs/day: 0.25    Years: 28.00    Pack years: 7.00    Types: Cigarettes    Last attempt to quit: 1981    Years since quitting: 38.3  . Smokeless tobacco: Never Used  Substance and Sexual Activity  . Alcohol use: Yes    Alcohol/week: 12.6 oz    Types: 7 Standard drinks or equivalent, 14 Shots of liquor per week    Comment: 07/01/2017 "couple shots/day"  . Drug use: No  . Sexual activity: Yes    Birth control/protection: Surgical    Comment: 1st intercourse 83 yo--Fewer than 5 partners  Lifestyle  . Physical activity:    Days per week: Not on file    Minutes per session: Not on file  . Stress: Not on file  Relationships  . Social connections:    Talks on phone: Not on file    Gets together: Not on file    Attends religious service: Not on file    Active member of club or organization: Not on file    Attends meetings of clubs or organizations: Not on file    Relationship status: Not on file  . Intimate partner violence:    Fear of current or ex partner: Not on file    Emotionally abused: Not on file    Physically abused: Not on file    Forced sexual activity: Not on file  Other Topics Concern  . Not on file  Social History Narrative   Regular Exercise -  YES          Allergies  Allergen Reactions  . Macrobid  [employer] Macro] Other (See Comments)    Nausea, stomach cramps, fatigue , headache.  . Meloxicam Other (See Comments)    Jittery and headache  . Digoxin And Related     headaches  . Sulfamethoxazole-Trimethoprim Nausea Only    Family History  Problem Relation Age of Onset  . Colon cancer Mother   . Cancer Father        Prostate  . Diabetes Father   . Cancer Brother  Prostate, pancreas  . Cancer Son        Stomach  . Heart attack Neg Hx   . Stroke Neg Hx     Prior to Admission medications   Medication Sig Start Date End Date Taking? Authorizing Provider  apixaban (ELIQUIS) 5 MG TABS tablet Take 5 mg by mouth 2 (two) times daily.   Yes [provider]  atorvastatin (LIPITOR) 80 MG tablet TAKE 1 TABLET AT 6PM. 12/18/17  Yes Belva Crome, MD  cetirizine (ZYRTEC) 10 MG tablet Take 10 mg by mouth daily as needed for allergies or rhinitis.    Yes [provider]  clobetasol cream (TEMOVATE) 0.05 % APPLY NIGHTLY AS DIRECTED. Patient taking differently: Apply 1 application topically daily as needed (irritation). APPLY NIGHTLY AS DIRECTED. 02/06/17  Yes Fontaine, Belinda Block, MD  clopidogrel (PLAVIX) 75 MG tablet Take 1 tablet (75 mg total) by mouth daily. 06/27/17 06/27/18 Yes Belva Crome, MD  diclofenac sodium (VOLTAREN) 1 % GEL APPLY 2 GRAMS TOPICALLY 4 TIMES DAILY. 07/11/17  Yes Plotnikov, Evie Lacks, MD  diphenoxylate-atropine (LOMOTIL) 2.5-0.025 MG tablet Take 1 tablet by mouth 4 (four) times daily as needed for diarrhea or loose stools. 09/13/16  Yes Plotnikov, Evie Lacks, MD  DULoxetine (CYMBALTA) 60 MG capsule TAKE (1) CAPSULE DAILY. 12/19/17  Yes Plotnikov, Evie Lacks, MD  ketotifen (ZADITOR) 0.025 % ophthalmic solution Place 1 drop into both eyes daily as needed (ITCHING).   Yes [provider]  linaclotide (LINZESS) 290 MCG CAPS capsule Take 290 mcg by mouth daily as needed (constipation).   Yes [provider]  LORazepam  (ATIVAN) 1 MG tablet Take 1 mg by mouth 2 (two) times daily as needed for anxiety or sleep.   Yes [provider]  metoprolol succinate (TOPROL-XL) 25 MG 24 hr tablet Take 3 tablets (75 mg total) by mouth daily. Take with or immediately following a meal. 11/06/17  Yes Bhagat, Bhavinkumar, PA  nitroGLYCERIN (NITROSTAT) 0.4 MG SL tablet Place 0.4 mg under the tongue every 5 (five) minutes as needed for chest pain (Call 911 at 3rd dose within 15 minutes.).   Yes [provider]  oxyCODONE-acetaminophen (PERCOCET/ROXICET) 5-325 MG tablet Take 1-2 tablets by mouth every 4 (four) hours as needed for severe pain. 12/23/17  Yes Upstill, Shari, PA-C  pantoprazole (PROTONIX) 40 MG tablet TAKE 1 TABLET BY MOUTH DAILY. 12/18/17  Yes Belva Crome, MD  sodium chloride (OCEAN) 0.65 % SOLN nasal spray Place 1 spray into both nostrils 2 (two) times daily as needed for congestion. 07/06/16  Yes Nche, Charlene Brooke, NP  predniSONE (DELTASONE) 10 MG tablet Use as directed per doctors orders. Patient not taking: Reported on 12/24/2017 12/16/17   Thurman Coyer, DO  promethazine (PHENERGAN) 25 MG tablet Take 1 tablet (25 mg total) by mouth every 8 (eight) hours as needed for nausea or vomiting. Patient not taking: Reported on 12/24/2017 09/13/16   Plotnikov, Evie Lacks, MD    Physical Exam: Vitals:   12/24/17 1515 12/24/17 1545 12/24/17 1615 12/24/17 1700  BP: 112/70 116/80 138/89 131/88  Pulse:    90  Resp: 13 15 14 15   Temp:      TempSrc:      SpO2:    98%     General:  Appears calm and comfortable and is NAD Eyes:  PERRL, EOMI, normal lids, iris ENT:  grossly normal hearing, lips & tongue, mmm; appropriate dentition Neck:  no LAD, masses or thyromegaly; no carotid  bruits Cardiovascular:  RRR, no m/r/g. No LE edema.  Respiratory:   CTA bilaterally with no wheezes/rales/rhonchi.  Normal respiratory effort. Abdomen:  soft, NT, ND, NABS Skin: small skin tear on left inner thumb; larger skin  tears with covering tegaderm on right anterior knee and right anterior shin Musculoskeletal: Right leg remains in a posterior splint immobilizer and also wrapped in a compression wrap Psychiatric:  grossly normal mood and affect, speech fluent and appropriate, AOx3 Neurologic:  CN 2-12 grossly intact, moves all extremities in coordinated fashion, sensation intact    Radiological Exams on Admission: Dg Chest 2 View  Result Date: 12/24/2017 CLINICAL DATA:  Syncope.  Preop for ruptured quadriceps. EXAM: CHEST - 2 VIEW COMPARISON:  07/03/2014 FINDINGS: Stable borderline heart size and mild aortic tortuosity. There is no edema, consolidation, effusion, or pneumothorax. Spondylosis. IMPRESSION: No evidence of active disease. Electronically Signed   By: Monte Fantasia M.D.   On: 12/24/2017 13:02   Ct Knee Right Wo Contrast  Result Date: 12/23/2017 CLINICAL DATA:  Status post fall, with injury to the right knee. Initial encounter. EXAM: CT OF THE RIGHT KNEE WITHOUT CONTRAST TECHNIQUE: Multidetector CT imaging of the right knee was performed according to the standard protocol. Multiplanar CT image reconstructions were also generated. COMPARISON:  Right knee radiographs performed earlier today at 7:42 p.m. FINDINGS: Bones/Joint/Cartilage There is no evidence of fracture or dislocation. Visualized joint spaces are grossly preserved. No knee joint effusion is seen. The cartilage is not well assessed on CT. Ligaments The anterior and posterior cruciate ligaments appear grossly intact. The medial collateral ligament and lateral collateral ligament complex are grossly unremarkable. Muscles and Tendons There appears to be partial disruption of the distal aspect of the quadriceps, with a large 8.9 x 4.6 x 7.2 cm hematoma anterior to the patella, with fluid-fluid levels. Remaining musculature is grossly unremarkable in appearance. Soft tissues Soft tissue injury is noted tracking about the knee. Scattered vascular  calcifications are seen. IMPRESSION: 1. No evidence of fracture or dislocation. 2. Partial disruption of the distal aspect of the quadriceps muscle, with a large 8.9 cm hematoma anterior to the patella, with fluid-fluid levels. Surrounding soft tissue injury noted tracking about the knee. 3. Scattered vascular calcifications. Electronically Signed   By: Garald Balding M.D.   On: 12/23/2017 00:47   Dg Knee Complete 4 Views Right  Result Date: 12/22/2017 CLINICAL DATA:  Fall with bleeding and swelling EXAM: RIGHT KNEE - COMPLETE 4+ VIEW COMPARISON:  11/27/2013 FINDINGS: No acute displaced fracture or malalignment is seen. Joint space calcifications. No large knee fusion. Large amount of prepatellar soft tissue swelling. Mild degenerative changes of the medial joint space. IMPRESSION: Large amount of prepatellar soft tissue swelling. No acute osseous abnormality. Chondrocalcinosis. Electronically Signed   By: Donavan Foil M.D.   On: 12/22/2017 19:59    EKG: Independently reviewed.  Afib with rate 75; RBBB; nonspecific ST changes with no evidence of acute ischemia   Labs on Admission: I have personally reviewed the available labs and imaging studies at the time of the admission.  Pertinent labs:   CO2 20 Glucose 112 BUN 24/Creatinine 1.23/GFR 40 Troponin 0.03 WBC 9.2 Hgb 9.1   Assessment/Plan Principal Problem:   Quadriceps tendon rupture Active Problems:   Essential hypertension   Hypotension   PAF (paroxysmal atrial fibrillation) (HCC)   Coronary artery disease involving native coronary artery of native heart with angina pectoris (HCC)   Chronic anticoagulation   CKD (chronic kidney disease), stage II  Quadriceps tendon rupture -Mechanical fall resulting in right quad tear with prepatellar hematoma -Orthopedics plans to do operative repair tomorrow, planned for 4pm -NPO after 10AM in anticipation of surgical repair tomorrow afternoon -SCDs overnight, start Lovenox  post-operatively (or as per ortho) -Pain control with Robxain, Vicodin, and Morphine prn -Will need PT consult post-operatively  CAD/PAF on [facility] -Patient with fairly recent stent and also with afib -She is taking both Plavix and Eliquis at home -Last dose of Eliquis was 4/15 AM -Will continue to hold Eliquis -Orthopedics has approved ongoing use of Plavix so will continue -Cardiology is following the patient  Hypotension with chronic HTN -Patient with an episode of apparent orthostatic hypotension at Dr. Archie Endo office -Patient and husband report that this happens periodically -Her BP has normalized -Will continue Toprol for perioperative cardiovascular protection  CKD -Appears to be stable -Gentle IVF hydration to ensure good renal perfusion intraoperatively   DVT prophylaxis:  SCDs until approved for Lovenox by orthopedics Code Status:  FULL - confirmed with patient/family Family Communication: Husband present throughout encounter  Disposition Plan:  Home once clinically improved Consults called: Orthopedics; Cardiology; CM; Nutrition; will need PT post-operatively  Admission status: It is my clinical opinion that referral for OBSERVATION is reasonable and necessary in this patient based on the above information provided. The aforementioned taken together are felt to place the patient at high risk for further clinical deterioration. However it is anticipated that the patient may be medically stable for discharge from the hospital within 24 to 48 hours.   Karmen Bongo MD [facility]  If note is complete, please contact covering daytime or nighttime physician. [email or web address] Password [facility]  12/24/2017, 5:29 PM

## 2017-12-24 NOTE — ED Notes (Signed)
Attempted  To call report

## 2017-12-24 NOTE — ED Provider Notes (Signed)
[facility] Provider Note   CSN: [id number] Arrival date & time: 12/24/17  1216     History   Chief Complaint Chief Complaint  Patient presents with  . Pre-op Exam  . Hypotension    HPI Hailey [name] is a 83 y.o. female.  HPI Presents from her 70 office due to episode of syncope. Patient is awake and alert on arrival, states that she was feeling lightheaded, while getting a preoperative evaluation. Subsequently, according to the husband, the patient had an episode of syncope, without consciousness for several moments. She was noted to be hypotensive on EMS arrival, though this has improved by the time of ED evaluation according to the paramedics who brought the patient. The patient herself denies focal pain, beyond her right leg pain, which has a quadriceps tendon rupture. She denies new weakness or loss of sensation in the toes. She denies other changes, and her husband corroborates that.  She acknowledges multiple medical issues including atrial fibrillation prior coronary event, and is taking both oral anticoagulant and Plavix.  Past Medical History:  Diagnosis Date  . Allergic rhinitis   . Anxiety   . Arthritis    "my whole spine" (07/01/2017)  . Atrial fibrillation ([place])   . Coronary artery disease    10/18 PCI/DES to p/m LCx with cutting balloon to mLcx  . Diverticulosis of colon   . GERD (gastroesophageal reflux disease)   . Hip bursitis 2010   Dr Para March, Post op seroma  . History of colon polyps   . HTN (hypertension)   . IBS (irritable bowel syndrome)    constipation predominant - Dr Earlean Shawl  . Lichen sclerosus   . Osteopenia 11/2016   T score -2.0 FRAX 15%/4.3%  . PAC (premature atrial contraction)    Symptomatiic  . Scoliosis   . SVT (supraventricular tachycardia) ([place])    brief history    Patient Active Problem List   Diagnosis Date Noted  . Iliotibial band syndrome of right side 12/16/2017  .  Depression 07/11/2017  . Intervertebral lumbar disc disorder with myelopathy, lumbar region 04/25/2017  . Tendinitis, de Quervain's 11/07/2016  . Gastroenteritis 09/13/2016  . Stress at home 09/13/2016  . Chronic anticoagulation 05/24/2015  . Medication intolerance 02/18/2015  . Coronary artery disease involving native coronary artery of native heart with angina pectoris ([place]) 12/20/2014  . Iliotibial band syndrome of left side 11/16/2014  . Diverticulitis of colon 08/30/2014  . URI, acute 09/07/2013  . PAF (paroxysmal atrial fibrillation) ([place]) 11/21/2012  . Greater trochanteric bursitis of both hips 05/21/2012  . Strain of abdominal muscle 02/13/2012  . 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 Acute upper respiratory infection 09/20/2010  . Osteoarthritis of left hip 07/03/2010  . [place] DISEASE, LUMBOSACRAL SPINE 05/18/2010  . TOBACCO USE, QUIT 12/01/2009  . SYNCOPE 10/27/2008  . Disorder resulting from impaired renal function 08/25/2008  . Hypotension 05/26/2008  . Diverticulosis of large intestine 05/21/2008  . IBS 05/21/2008  . Essential hypertension 06/16/2007  . GERD 06/16/2007  . COLONIC POLYPS, HX OF 06/16/2007    Past Surgical History:  Procedure Laterality Date  . ANTERIOR AND POSTERIOR VAGINAL REPAIR  01/2002   Archie Endo 01/23/2011  . APPENDECTOMY  1948  . CARDIAC CATHETERIZATION  06/26/2017  . CORONARY ANGIOPLASTY WITH STENT PLACEMENT  07/01/2017  . CORONARY ATHERECTOMY N/A 07/01/2017   Procedure: CORONARY ATHERECTOMY;  Surgeon: Belva Crome, MD;  Location: [facility];  Service: Cardiovascular;  Laterality:  N/A;  . CORONARY STENT INTERVENTION N/A 07/01/2017   Procedure: CORONARY STENT INTERVENTION;  Surgeon: Belva Crome, MD;  Location: [facility];  Service: Cardiovascular;  Laterality: N/A;  . HAMMER TOE SURGERY    . HEMORRHOID BANDING    . HIP SURGERY Left 04/2009   hip examination under anesthesia followed by greater trochanteric  bursectomy; iliotibial band tenotomy/notes 01/20/2011  . KNEE BURSECTOMY Right 04/2009   Archie Endo 01/09/2011  . LEFT HEART CATH AND CORONARY ANGIOGRAPHY N/A 06/26/2017   Procedure: LEFT HEART CATH AND CORONARY ANGIOGRAPHY;  Surgeon: Belva Crome, MD;  Location: [facility];  Service: Cardiovascular;  Laterality: N/A;  . PUBOVAGINAL SLING  01/2002   Archie Endo 01/23/2011  . REDUCTION MAMMAPLASTY    . TEMPORARY PACEMAKER N/A 07/01/2017   Procedure: TEMPORARY PACEMAKER;  Surgeon: Belva Crome, MD;  Location: [facility];  Service: Cardiovascular;  Laterality: N/A;  . VAGINAL HYSTERECTOMY  01/2002   Vaginal hysterectomy, bilateral salpingo-oophorectomy/notes 01/23/2011     OB History    Gravida  2   Para  2   Term  2   Preterm      AB      Living  1     SAB      TAB      Ectopic      Multiple      Live Births               Home Medications    Prior to Admission medications   Medication Sig Start Date End Date Taking? Authorizing Provider  apixaban (ELIQUIS) 5 MG TABS tablet Take 5 mg by mouth 2 (two) times daily.    [provider]  atorvastatin (LIPITOR) 80 MG tablet TAKE 1 TABLET AT 6PM. 12/18/17   Belva Crome, MD  cetirizine (ZYRTEC) 10 MG tablet Take 10 mg by mouth daily as needed for allergies or rhinitis.     [provider]  clobetasol cream (TEMOVATE) 0.05 % APPLY NIGHTLY AS DIRECTED. Patient taking differently: Apply 1 application topically daily as needed (irritation). APPLY NIGHTLY AS DIRECTED. 02/06/17   Fontaine, Belinda Block, MD  clopidogrel (PLAVIX) 75 MG tablet Take 1 tablet (75 mg total) by mouth daily. 06/27/17 06/27/18  Belva Crome, MD  diclofenac sodium (VOLTAREN) 1 % GEL APPLY 2 GRAMS TOPICALLY 4 TIMES DAILY. 07/11/17   Plotnikov, Evie Lacks, MD  diphenoxylate-atropine (LOMOTIL) 2.5-0.025 MG tablet Take 1 tablet by mouth 4 (four) times daily as needed for diarrhea or loose stools. 09/13/16   Plotnikov, Evie Lacks, MD    DULoxetine (CYMBALTA) 60 MG capsule TAKE (1) CAPSULE DAILY. 12/19/17   Plotnikov, Evie Lacks, MD  ketotifen (ZADITOR) 0.025 % ophthalmic solution Place 1 drop into both eyes daily as needed (ITCHING).    [provider]  linaclotide (LINZESS) 290 MCG CAPS capsule Take 290 mcg by mouth daily as needed (constipation).    [provider]  LORazepam (ATIVAN) 1 MG tablet Take 1 mg by mouth 2 (two) times daily as needed for anxiety or sleep.    [provider]  metoprolol succinate (TOPROL-XL) 25 MG 24 hr tablet Take 3 tablets (75 mg total) by mouth daily. Take with or immediately following a meal. 11/06/17   Bhagat, Bhavinkumar, PA  nitroGLYCERIN (NITROSTAT) 0.4 MG SL tablet Place 0.4 mg under the tongue every 5 (five) minutes as needed for chest pain (Call 911 at 3rd dose within 15 minutes.).    [provider]  oxyCODONE-acetaminophen (PERCOCET/ROXICET) 5-325 MG tablet Take 1-2 tablets by mouth every 4 (four) hours as needed for severe pain. 12/23/17   Charlann Lange, PA-C  pantoprazole (PROTONIX) 40 MG tablet TAKE 1 TABLET BY MOUTH DAILY. 12/18/17   Belva Crome, MD  predniSONE (DELTASONE) 10 MG tablet Use as directed per doctors orders. Patient taking differently: Take 10 mg by mouth See admin instructions. 10 mg 6 day dose pack- pt on day 5 of therapy 12/16/17   Thurman Coyer, DO  promethazine (PHENERGAN) 25 MG tablet Take 1 tablet (25 mg total) by mouth every 8 (eight) hours as needed for nausea or vomiting. Patient not taking: Reported on 12/22/2017 09/13/16   Plotnikov, Evie Lacks, MD  sodium chloride (OCEAN) 0.65 % SOLN nasal spray Place 1 spray into both nostrils 2 (two) times daily as needed for congestion. 07/06/16   Nche, Charlene Brooke, NP    Family History Family History  Problem Relation Age of Onset  . Colon cancer Mother   . Cancer Father        Prostate  . Diabetes Father   . Cancer Brother        Prostate, pancreas  . Cancer Son        Stomach   . Heart attack Neg Hx   . Stroke Neg Hx     Social History Social History   Tobacco Use  . Smoking status: Former Smoker    Packs/day: 0.25    Years: 28.00    Pack years: 7.00    Types: Cigarettes    Last attempt to quit: 1981    Years since quitting: 38.3  . Smokeless tobacco: Never Used  Substance Use Topics  . Alcohol use: Yes    Alcohol/week: 12.6 oz    Types: 7 Standard drinks or equivalent, 14 Shots of liquor per week    Comment: 07/01/2017 "couple shots/day"  . Drug use: No     Allergies   Macrobid [nitrofurantoin monohyd macro]; Meloxicam; Digoxin and related; and Sulfamethoxazole-trimethoprim   Review of Systems Review of Systems  Constitutional:       Per HPI, otherwise negative  HENT:       Per HPI, otherwise negative  Respiratory:       Per HPI, otherwise negative  Cardiovascular:       Per HPI, otherwise negative  Gastrointestinal: Negative for vomiting.  Endocrine:       Negative aside from HPI  Genitourinary:       Neg aside from HPI   Musculoskeletal:       Per HPI, otherwise negative  Skin: Negative.   Neurological: Positive for syncope and weakness.  Hematological: Bruises/bleeds easily.     Physical Exam Updated Vital Signs BP 113/71   Pulse 80   Temp 98.4 F (36.9 C) (Oral)   Resp 19   SpO2 97%   Physical Exam  Constitutional: She is oriented to person, place, and time. She appears well-developed and well-nourished. No distress.  HENT:  Head: Normocephalic and atraumatic.  Eyes: Conjunctivae and EOM are normal.  Cardiovascular: Normal rate. An irregularly irregular rhythm present.  Pulmonary/Chest: Effort normal and breath sounds normal. No stridor. No respiratory distress.  Abdominal: She exhibits no distension.  Musculoskeletal: She exhibits no edema.       Legs: Neurological: She is alert and oriented to person, place, and time. No cranial nerve deficit.  Skin: Skin is warm and dry.  Psychiatric: She has a normal mood  and  affect.  Nursing note and vitals reviewed.    ED Treatments / Results  Labs (all labs ordered are listed, but only abnormal results are displayed) Labs Reviewed  COMPREHENSIVE METABOLIC PANEL  TROPONIN I  CBC WITH DIFFERENTIAL/PLATELET  CBG MONITORING, ED    EKG EKG Interpretation  Date/Time:  Tuesday December 24 2017 12:28:02 EDT Ventricular Rate:  75 PR Interval:    QRS Duration: 131 QT Interval:  435 QTC Calculation: 486 R Axis:   -4 Text Interpretation:  Atrial fibrillation Right bundle branch block Confirmed by Carmin Muskrat [phone]) on 12/24/2017 1:23:04 PM   Radiology Dg Chest 2 View  Result Date: 12/24/2017 CLINICAL DATA:  Syncope.  Preop for ruptured quadriceps. EXAM: CHEST - 2 VIEW COMPARISON:  07/03/2014 FINDINGS: Stable borderline heart size and mild aortic tortuosity. There is no edema, consolidation, effusion, or pneumothorax. Spondylosis. IMPRESSION: No evidence of active disease. Electronically Signed   By: Monte Fantasia M.D.   On: 12/24/2017 13:02   Ct Knee Right Wo Contrast  Result Date: 12/23/2017 CLINICAL DATA:  Status post fall, with injury to the right knee. Initial encounter. EXAM: CT OF THE RIGHT KNEE WITHOUT CONTRAST TECHNIQUE: Multidetector CT imaging of the right knee was performed according to the standard protocol. Multiplanar CT image reconstructions were also generated. COMPARISON:  Right knee radiographs performed earlier today at 7:42 p.m. FINDINGS: Bones/Joint/Cartilage There is no evidence of fracture or dislocation. Visualized joint spaces are grossly preserved. No knee joint effusion is seen. The cartilage is not well assessed on CT. Ligaments The anterior and posterior cruciate ligaments appear grossly intact. The medial collateral ligament and lateral collateral ligament complex are grossly unremarkable. Muscles and Tendons There appears to be partial disruption of the distal aspect of the quadriceps, with a large 8.9 x 4.6 x 7.2 cm  hematoma anterior to the patella, with fluid-fluid levels. Remaining musculature is grossly unremarkable in appearance. Soft tissues Soft tissue injury is noted tracking about the knee. Scattered vascular calcifications are seen. IMPRESSION: 1. No evidence of fracture or dislocation. 2. Partial disruption of the distal aspect of the quadriceps muscle, with a large 8.9 cm hematoma anterior to the patella, with fluid-fluid levels. Surrounding soft tissue injury noted tracking about the knee. 3. Scattered vascular calcifications. Electronically Signed   By: Garald Balding M.D.   On: 12/23/2017 00:47   Dg Knee Complete 4 Views Right  Result Date: 12/22/2017 CLINICAL DATA:  Fall with bleeding and swelling EXAM: RIGHT KNEE - COMPLETE 4+ VIEW COMPARISON:  11/27/2013 FINDINGS: No acute displaced fracture or malalignment is seen. Joint space calcifications. No large knee fusion. Large amount of prepatellar soft tissue swelling. Mild degenerative changes of the medial joint space. IMPRESSION: Large amount of prepatellar soft tissue swelling. No acute osseous abnormality. Chondrocalcinosis. Electronically Signed   By: Donavan Foil M.D.   On: 12/22/2017 19:59    Procedures Procedures (including critical care time)  Medications Ordered in ED Medications  0.9 %  sodium chloride infusion (has no administration in time range)     Initial Impression / Assessment and Plan / ED Course  I have reviewed the triage vital signs and the nursing notes.  Pertinent labs & imaging results that were available during my care of the patient were reviewed by me and considered in my medical decision making (see chart for details).   Days x-rays reassuring, no evidence for new thoracic pathology. Reviewed the scan from yesterday as well, with demonstration for eruption of the quadriceps tendon.  Update:, On repeat exam the patient's blood pressure is 125/80, she has no ongoing complaints.   Patient remains  hemodynamically unremarkable, has been seen by cardiology colleagues here in the emergency department. I discussed patient's case with her orthopedic team as well given the need for surgery tomorrow, and with concern for today's syncopal episode, patient will be admitted to the hospitalist team. Initial findings reassuring, with persistent A. fib, but no other dangerous arrhythmia. Blood pressure is improved, no evidence for bacteremia, sepsi, patient does have mild troponin elevation, but this may be due to her. Stressful event versus rate related, A. fib, with no ongoing chest pain, no notable EKG changes, low suspicion for ACS, and cardiology has cleared the patient.   Final Clinical Impressions(s) / ED Diagnoses   Final diagnoses:  Syncope and collapse     Carmin Muskrat, MD 12/24/17 1801

## 2017-12-24 NOTE — ED Notes (Signed)
Gave pt [employer] dinner tray, per [facility] - RN.

## 2017-12-24 NOTE — ED Notes (Signed)
Patient transported to X-ray 

## 2017-12-24 NOTE — Progress Notes (Signed)
Orthopedic Tech Progress Note Patient Details:  Hailey [name] Dec 07, 1934 [id number]  Patient ID: [name], female   DOB: 1934-12-16, 83 y.o.   MRN: [id number] Pt cant have ohf due to age restrictions.  [name] 12/24/2017, 11:04 PM

## 2017-12-24 NOTE — ED Notes (Addendum)
Pt returned from X Ray.

## 2017-12-24 NOTE — H&P (Addendum)
Hailey [name] is an 83 y.o. female.   Chief Complaint: right quad tendon rupture with hypotensive episode today in the orthopedic office HPI: 84 yowf fell at home on 12/22/2017.  She was seen in [facility] ER where CT revealed no fracture in the knee but a quad tendon rupture.  She is on dual anticoagulation therapy by Dr Pernell Dupre in Cardiology due to cardiac stent placement in October of 2018 and chronic atrial fibrillation.  Today in Dr Archie Endo office she got light headed and dizzyness vasovagal.  IV started in the office by [place].  Medicine is admitting to follow her Atrial fib, anemia, mildly elevated troponin in the setting of no chest pain.  We have requested [place] with telemetry.  Past Medical History:  Diagnosis Date  . Allergic rhinitis   . Anxiety   . Arthritis    "my whole spine" (07/01/2017)  . Atrial fibrillation ([place])   . CKD (chronic kidney disease) stage 2, GFR 60-89 ml/min   . Coronary artery disease    10/18 PCI/DES to p/m LCx with cutting balloon to mLcx  . Diverticulosis of colon   . GERD (gastroesophageal reflux disease)   . Hip bursitis 2010   Dr Para March, Post op seroma  . History of colon polyps   . HTN (hypertension)   . IBS (irritable bowel syndrome)    constipation predominant - Dr Earlean Shawl  . Lichen sclerosus   . Osteopenia 11/2016   T score -2.0 FRAX 15%/4.3%  . PAC (premature atrial contraction)    Symptomatiic  . Scoliosis   . SVT (supraventricular tachycardia) ([place])    brief history    Past Surgical History:  Procedure Laterality Date  . ANTERIOR AND POSTERIOR VAGINAL REPAIR  01/2002   Archie Endo 01/23/2011  . APPENDECTOMY  1948  . CARDIAC CATHETERIZATION  06/26/2017  . CORONARY ANGIOPLASTY WITH STENT PLACEMENT  07/01/2017  . CORONARY ATHERECTOMY N/A 07/01/2017   Procedure: CORONARY ATHERECTOMY;  Surgeon: Belva Crome, MD;  Location: [place] [facility];  Service: Cardiovascular;  Laterality: N/A;  . CORONARY STENT INTERVENTION N/A  07/01/2017   Procedure: CORONARY STENT INTERVENTION;  Surgeon: Belva Crome, MD;  Location: [place] [facility];  Service: Cardiovascular;  Laterality: N/A;  . HAMMER TOE SURGERY    . HEMORRHOID BANDING    . HIP SURGERY Left 04/2009   hip examination under anesthesia followed by greater trochanteric bursectomy; iliotibial band tenotomy/notes 01/20/2011  . KNEE BURSECTOMY Right 04/2009   Archie Endo 01/09/2011  . LEFT HEART CATH AND CORONARY ANGIOGRAPHY N/A 06/26/2017   Procedure: LEFT HEART CATH AND CORONARY ANGIOGRAPHY;  Surgeon: Belva Crome, MD;  Location: [place] [facility];  Service: Cardiovascular;  Laterality: N/A;  . PUBOVAGINAL SLING  01/2002   Archie Endo 01/23/2011  . REDUCTION MAMMAPLASTY    . TEMPORARY PACEMAKER N/A 07/01/2017   Procedure: TEMPORARY PACEMAKER;  Surgeon: Belva Crome, MD;  Location: [facility];  Service: Cardiovascular;  Laterality: N/A;  . VAGINAL HYSTERECTOMY  01/2002   Vaginal hysterectomy, bilateral salpingo-oophorectomy/notes 01/23/2011    Family History  Problem Relation Age of Onset  . Colon cancer Mother   . Cancer Father        Prostate  . Diabetes Father   . Cancer Brother        Prostate, pancreas  . Cancer Son        Stomach  . Heart attack Neg Hx   . Stroke Neg Hx    Social  History:  reports that she quit smoking about 38 years ago. Her smoking use included cigarettes. She has a 7.00 pack-year smoking history. She has never used smokeless tobacco. She reports that she drinks about 12.6 oz of alcohol per week. She reports that she does not use drugs.  Allergies:  Allergies  Allergen Reactions  . Macrobid [employer] Macro] Other (See Comments)    Nausea, stomach cramps, fatigue , headache.  . Meloxicam Other (See Comments)    Jittery and headache  . Digoxin And Related     headaches  . Sulfamethoxazole-Trimethoprim Nausea Only     (Not in a hospital admission)  Results for orders placed or performed during the  hospital encounter of 12/24/17 (from the past 48 hour(s))  Comprehensive metabolic panel     Status: Abnormal   Collection Time: 12/24/17 12:07 PM  Result Value Ref Range   Sodium 136 135 - 145 mmol/L   Potassium 3.6 3.5 - 5.1 mmol/L   Chloride 105 101 - 111 mmol/L   CO2 20 (L) 22 - 32 mmol/L   Glucose, Bld 112 (H) 65 - 99 mg/dL   BUN 24 (H) 6 - 20 mg/dL   Creatinine, Ser 1.23 (H) 0.44 - 1.00 mg/dL   Calcium 8.7 (L) 8.9 - 10.3 mg/dL   Total Protein 5.7 (L) 6.5 - 8.1 g/dL   Albumin 3.4 (L) 3.5 - 5.0 g/dL   AST 19 15 - 41 U/L   ALT 14 14 - 54 U/L   Alkaline Phosphatase 52 38 - 126 U/L   Total Bilirubin 0.7 0.3 - 1.2 mg/dL   GFR calc non Af Amer 40 (L) >60 mL/min   GFR calc Af Amer 46 (L) >60 mL/min    Comment: (NOTE) The eGFR has been calculated using the CKD EPI equation. This calculation has not been validated in all clinical situations. eGFR's persistently <60 mL/min signify possible Chronic Kidney Disease.    Anion gap 11 5 - 15    Comment: Performed at [facility] [address], [place], [place] [address]  Troponin I     Status: Abnormal   Collection Time: 12/24/17 12:07 PM  Result Value Ref Range   Troponin I 0.03 (HH) <0.03 ng/mL    Comment: CRITICAL RESULT CALLED TO, READ BACK BY AND VERIFIED WITHElzie Rings RN 1448 12/24/2017 BY A BENNETT Performed at [facility], [place] [address] [place], [place] [address]   CBC WITH DIFFERENTIAL     Status: Abnormal   Collection Time: 12/24/17 12:07 PM  Result Value Ref Range   WBC 9.2 4.0 - 10.5 K/uL   RBC 3.23 (L) 3.87 - 5.11 MIL/uL   Hemoglobin 9.1 (L) 12.0 - 15.0 g/dL   HCT 28.5 (L) 36.0 - 46.0 %   MCV 88.2 78.0 - 100.0 fL   MCH 28.2 26.0 - 34.0 pg   MCHC 31.9 30.0 - 36.0 g/dL   RDW 16.3 (H) 11.5 - 15.5 %   Platelets 265 150 - 400 K/uL   Neutrophils Relative % 71 %   Neutro Abs 6.6 1.7 - 7.7 K/uL   Lymphocytes Relative 14 %   Lymphs Abs 1.3 0.7 - 4.0 K/uL   Monocytes Relative 12 %   Monocytes Absolute 1.1  (H) 0.1 - 1.0 K/uL   Eosinophils Relative 3 %   Eosinophils Absolute 0.2 0.0 - 0.7 K/uL   Basophils Relative 0 %   Basophils Absolute 0.0 0.0 - 0.1 K/uL  Comment: Performed at [facility], [place] [address] [place], [place] [address]   Dg Chest 2 View  Result Date: 12/24/2017 CLINICAL DATA:  Syncope.  Preop for ruptured quadriceps. EXAM: CHEST - 2 VIEW COMPARISON:  07/03/2014 FINDINGS: Stable borderline heart size and mild aortic tortuosity. There is no edema, consolidation, effusion, or pneumothorax. Spondylosis. IMPRESSION: No evidence of active disease. Electronically Signed   By: Monte Fantasia M.D.   On: 12/24/2017 13:02   Ct Knee Right Wo Contrast  Result Date: 12/23/2017 CLINICAL DATA:  Status post fall, with injury to the right knee. Initial encounter. EXAM: CT OF THE RIGHT KNEE WITHOUT CONTRAST TECHNIQUE: Multidetector CT imaging of the right knee was performed according to the standard protocol. Multiplanar CT image reconstructions were also generated. COMPARISON:  Right knee radiographs performed earlier today at 7:42 p.m. FINDINGS: Bones/Joint/Cartilage There is no evidence of fracture or dislocation. Visualized joint spaces are grossly preserved. No knee joint effusion is seen. The cartilage is not well assessed on CT. Ligaments The anterior and posterior cruciate ligaments appear grossly intact. The medial collateral ligament and lateral collateral ligament complex are grossly unremarkable. Muscles and Tendons There appears to be partial disruption of the distal aspect of the quadriceps, with a large 8.9 x 4.6 x 7.2 cm hematoma anterior to the patella, with fluid-fluid levels. Remaining musculature is grossly unremarkable in appearance. Soft tissues Soft tissue injury is noted tracking about the knee. Scattered vascular calcifications are seen. IMPRESSION: 1. No evidence of fracture or dislocation. 2. Partial disruption of the distal aspect of the quadriceps muscle, with a large  8.9 cm hematoma anterior to the patella, with fluid-fluid levels. Surrounding soft tissue injury noted tracking about the knee. 3. Scattered vascular calcifications. Electronically Signed   By: Garald Balding M.D.   On: 12/23/2017 00:47   Dg Knee Complete 4 Views Right  Result Date: 12/22/2017 CLINICAL DATA:  Fall with bleeding and swelling EXAM: RIGHT KNEE - COMPLETE 4+ VIEW COMPARISON:  11/27/2013 FINDINGS: No acute displaced fracture or malalignment is seen. Joint space calcifications. No large knee fusion. Large amount of prepatellar soft tissue swelling. Mild degenerative changes of the medial joint space. IMPRESSION: Large amount of prepatellar soft tissue swelling. No acute osseous abnormality. Chondrocalcinosis. Electronically Signed   By: Donavan Foil M.D.   On: 12/22/2017 19:59    Review of Systems  Constitutional: Negative for chills, fever and weight loss.  HENT: Negative.   Eyes: Negative.   Respiratory: Negative.   Cardiovascular: Negative for chest pain.  Gastrointestinal: Negative.   Genitourinary: Negative.   Musculoskeletal: Positive for joint pain.       Right knee abrasions over knee    Very swollen with hemarthrosis.  Obvious quad rupture.  Splint fitting well and DNVI  Skin: Negative for itching and rash.  Neurological: Negative.   Endo/Heme/Allergies: Negative.   Psychiatric/Behavioral: Negative.     Blood pressure 112/70, pulse 80, temperature 98.4 F (36.9 C), temperature source Oral, resp. rate 13, SpO2 97 %. Physical Exam  Constitutional: She is oriented to person, place, and time. She appears well-developed and well-nourished.  HENT:  Head: Normocephalic and atraumatic.  Mouth/Throat: Oropharynx is clear and moist.  Eyes: Pupils are equal, round, and reactive to light. Conjunctivae are normal.  Neck: Neck supple.  Cardiovascular:  Irregular rhythm rate controlled  Respiratory: Breath sounds normal.  GI: Bowel sounds are normal.  Genitourinary:   Genitourinary Comments: Not pertinent to current symptomatology therefore not examined.  Musculoskeletal:  Right leg very swollen and bruised.  Large hemarthrosis with multiple open wounds.     Neurological: She is alert and oriented to person, place, and time.  Psychiatric: She has a normal mood and affect.     Assessment Principal Problem:   Quadriceps tendon rupture Active Problems:   Essential hypertension   Hypotension   PAF (paroxysmal atrial fibrillation) (HCC)   Coronary artery disease involving native coronary artery of native heart with angina pectoris (HCC)   Chronic anticoagulation   CKD (chronic kidney disease), stage II   Plan This patient has been seen by cardiology (Dr Quay Burow) the plan is to continue plavix but discontinue eliquis.  Post op will restart eliquis 24 hrs post op at DVT prophylaxis dosing.  Medicine will admit and follow anemia, pressure, renal function.  Plan for surgery at 4 pm tomorrow.  NPO after midnight  Post op plan is for skilled nursing at [facility] or [facility] expect discharge on Friday if patient progresses well medically and surgically  Linda Hedges, PA-C 12/24/2017, 3:41 PM

## 2017-12-24 NOTE — ED Triage Notes (Signed)
PT to have surgery tomm for ruptured quad on R leg from recent fall; on blood thinners; needs evaluation by cards for surgery. Pt had hypotension this morning when sat up- hx of such. Pt denies any dizziness and has no complaints. Husband at bedside.

## 2017-12-24 NOTE — ED Notes (Signed)
Cards and Ortho at bedside

## 2017-12-24 NOTE — ED Notes (Addendum)
Pt transported to XRay 

## 2017-12-25 ENCOUNTER — Other Ambulatory Visit: Payer: Self-pay

## 2017-12-25 ENCOUNTER — Encounter (HOSPITAL_COMMUNITY): Payer: Self-pay | Admitting: Physician Assistant

## 2017-12-25 ENCOUNTER — Inpatient Hospital Stay (HOSPITAL_COMMUNITY): Payer: Medicare Other

## 2017-12-25 ENCOUNTER — Encounter (HOSPITAL_COMMUNITY)
Admission: EM | Disposition: A | Discharge status: Skilled Nursing Facility | Payer: Self-pay | Source: Home / Self Care | Attending: Internal Medicine

## 2017-12-25 ENCOUNTER — Observation Stay (HOSPITAL_COMMUNITY): Payer: Medicare Other

## 2017-12-25 DIAGNOSIS — I480 Paroxysmal atrial fibrillation: Secondary | ICD-10-CM | POA: Diagnosis present

## 2017-12-25 DIAGNOSIS — S76111D Strain of right quadriceps muscle, fascia and tendon, subsequent encounter: Secondary | ICD-10-CM | POA: Diagnosis not present

## 2017-12-25 DIAGNOSIS — Z23 Encounter for immunization: Secondary | ICD-10-CM | POA: Diagnosis not present

## 2017-12-25 DIAGNOSIS — D62 Acute posthemorrhagic anemia: Secondary | ICD-10-CM | POA: Diagnosis present

## 2017-12-25 DIAGNOSIS — S8991XA Unspecified injury of right lower leg, initial encounter: Secondary | ICD-10-CM | POA: Diagnosis not present

## 2017-12-25 DIAGNOSIS — W010XXA Fall on same level from slipping, tripping and stumbling without subsequent striking against object, initial encounter: Secondary | ICD-10-CM | POA: Diagnosis present

## 2017-12-25 DIAGNOSIS — G8911 Acute pain due to trauma: Secondary | ICD-10-CM | POA: Diagnosis not present

## 2017-12-25 DIAGNOSIS — Z87891 Personal history of nicotine dependence: Secondary | ICD-10-CM | POA: Diagnosis not present

## 2017-12-25 DIAGNOSIS — E860 Dehydration: Secondary | ICD-10-CM | POA: Diagnosis present

## 2017-12-25 DIAGNOSIS — N183 Chronic kidney disease, stage 3 (moderate): Secondary | ICD-10-CM | POA: Diagnosis present

## 2017-12-25 DIAGNOSIS — I10 Essential (primary) hypertension: Secondary | ICD-10-CM | POA: Diagnosis not present

## 2017-12-25 DIAGNOSIS — I25119 Atherosclerotic heart disease of native coronary artery with unspecified angina pectoris: Secondary | ICD-10-CM | POA: Diagnosis present

## 2017-12-25 DIAGNOSIS — I129 Hypertensive chronic kidney disease with stage 1 through stage 4 chronic kidney disease, or unspecified chronic kidney disease: Secondary | ICD-10-CM | POA: Diagnosis present

## 2017-12-25 DIAGNOSIS — S81001A Unspecified open wound, right knee, initial encounter: Secondary | ICD-10-CM | POA: Diagnosis present

## 2017-12-25 DIAGNOSIS — N182 Chronic kidney disease, stage 2 (mild): Secondary | ICD-10-CM | POA: Diagnosis not present

## 2017-12-25 DIAGNOSIS — M23221 Derangement of posterior horn of medial meniscus due to old tear or injury, right knee: Secondary | ICD-10-CM | POA: Diagnosis not present

## 2017-12-25 DIAGNOSIS — I4510 Unspecified right bundle-branch block: Secondary | ICD-10-CM | POA: Diagnosis present

## 2017-12-25 DIAGNOSIS — R55 Syncope and collapse: Secondary | ICD-10-CM | POA: Diagnosis present

## 2017-12-25 DIAGNOSIS — L03115 Cellulitis of right lower limb: Secondary | ICD-10-CM | POA: Diagnosis not present

## 2017-12-25 DIAGNOSIS — Z7902 Long term (current) use of antithrombotics/antiplatelets: Secondary | ICD-10-CM | POA: Diagnosis not present

## 2017-12-25 DIAGNOSIS — S76111A Strain of right quadriceps muscle, fascia and tendon, initial encounter: Secondary | ICD-10-CM | POA: Diagnosis present

## 2017-12-25 DIAGNOSIS — K219 Gastro-esophageal reflux disease without esophagitis: Secondary | ICD-10-CM | POA: Diagnosis present

## 2017-12-25 DIAGNOSIS — R001 Bradycardia, unspecified: Secondary | ICD-10-CM | POA: Diagnosis not present

## 2017-12-25 DIAGNOSIS — S8990XA Unspecified injury of unspecified lower leg, initial encounter: Secondary | ICD-10-CM | POA: Diagnosis not present

## 2017-12-25 DIAGNOSIS — M79604 Pain in right leg: Secondary | ICD-10-CM | POA: Diagnosis not present

## 2017-12-25 DIAGNOSIS — Y92009 Unspecified place in unspecified non-institutional (private) residence as the place of occurrence of the external cause: Secondary | ICD-10-CM | POA: Diagnosis not present

## 2017-12-25 DIAGNOSIS — Z955 Presence of coronary angioplasty implant and graft: Secondary | ICD-10-CM | POA: Diagnosis not present

## 2017-12-25 DIAGNOSIS — Z79899 Other long term (current) drug therapy: Secondary | ICD-10-CM | POA: Diagnosis not present

## 2017-12-25 DIAGNOSIS — Z7901 Long term (current) use of anticoagulants: Secondary | ICD-10-CM | POA: Diagnosis not present

## 2017-12-25 DIAGNOSIS — S83511A Sprain of anterior cruciate ligament of right knee, initial encounter: Secondary | ICD-10-CM | POA: Diagnosis not present

## 2017-12-25 DIAGNOSIS — S83511D Sprain of anterior cruciate ligament of right knee, subsequent encounter: Secondary | ICD-10-CM | POA: Diagnosis not present

## 2017-12-25 DIAGNOSIS — S79911A Unspecified injury of right hip, initial encounter: Secondary | ICD-10-CM | POA: Diagnosis not present

## 2017-12-25 DIAGNOSIS — I951 Orthostatic hypotension: Secondary | ICD-10-CM | POA: Diagnosis present

## 2017-12-25 HISTORY — DX: Acute posthemorrhagic anemia: D62

## 2017-12-25 LAB — BASIC METABOLIC PANEL
Anion gap: 8 (ref 5–15)
BUN: 17 mg/dL (ref 6–20)
CO2: 20 mmol/L — ABNORMAL LOW (ref 22–32)
Calcium: 8.1 mg/dL — ABNORMAL LOW (ref 8.9–10.3)
Chloride: 108 mmol/L (ref 101–111)
Creatinine, Ser: 1.06 mg/dL — ABNORMAL HIGH (ref 0.44–1.00)
GFR calc Af Amer: 56 mL/min — ABNORMAL LOW (ref >60)
GFR calc non Af Amer: 48 mL/min — ABNORMAL LOW (ref >60)
Glucose, Bld: 104 mg/dL — ABNORMAL HIGH (ref 65–99)
Potassium: 3.8 mmol/L (ref 3.5–5.1)
Sodium: 136 mmol/L (ref 135–145)

## 2017-12-25 LAB — CBC
HCT: 24.3 % — ABNORMAL LOW (ref 36.0–46.0)
Hemoglobin: 7.9 g/dL — ABNORMAL LOW (ref 12.0–15.0)
MCH: 28.8 pg (ref 26.0–34.0)
MCHC: 32.5 g/dL (ref 30.0–36.0)
MCV: 88.7 fL (ref 78.0–100.0)
Platelets: 218 10*3/uL (ref 150–400)
RBC: 2.74 MIL/uL — ABNORMAL LOW (ref 3.87–5.11)
RDW: 16.6 % — ABNORMAL HIGH (ref 11.5–15.5)
WBC: 9.3 10*3/uL (ref 4.0–10.5)

## 2017-12-25 LAB — SURGICAL PCR SCREEN
MRSA, PCR: NEGATIVE
Staphylococcus aureus: POSITIVE — AB

## 2017-12-25 LAB — ABO/RH: ABO/RH(D): A POS

## 2017-12-25 LAB — PREPARE RBC (CROSSMATCH)

## 2017-12-25 LAB — TROPONIN I: Troponin I: <0.03 ng/mL (ref <0.03)

## 2017-12-25 SURGERY — REPAIR, TENDON, QUADRICEPS
Anesthesia: Choice | Laterality: Right

## 2017-12-25 MED ORDER — FUROSEMIDE 10 MG/ML IJ SOLN
20.0000 mg | Freq: Once | INTRAMUSCULAR | Status: AC
  Administered 2017-12-25: 20 mg via INTRAVENOUS
  Filled 2017-12-25: qty 2

## 2017-12-25 MED ORDER — SODIUM CHLORIDE 0.9 % IV SOLN
Freq: Once | INTRAVENOUS | Status: AC
  Administered 2017-12-25: 12:00:00 via INTRAVENOUS

## 2017-12-25 MED ORDER — APIXABAN 2.5 MG PO TABS
2.5000 mg | ORAL_TABLET | Freq: Two times a day (BID) | ORAL | Status: DC
  Administered 2017-12-25 – 2017-12-28 (×7): 2.5 mg via ORAL
  Filled 2017-12-25 (×7): qty 1

## 2017-12-25 NOTE — Progress Notes (Signed)
Pt completed 1unit PRBC transfusion, no reactions noted, tolerated well.

## 2017-12-25 NOTE — Progress Notes (Signed)
Subjective: Patient is feeling better today then yesterday.  She is having less lightheadedness.  Pain is controlled  Objective: Vital signs in last 24 hours: Temp:  [97.7 F (36.5 C)-99.5 F (37.5 C)] 97.7 F (36.5 C) (04/17 8242) Pulse Rate:  [67-105] 67 (04/17 0613) Resp:  [12-24] 16 (04/17 0613) BP: (105-159)/(68-117) 127/82 (04/17 0613) SpO2:  [91 %-100 %] 91 % (04/17 3536) Weight:  [75.8 kg (167 lb)] 75.8 kg (167 lb) (04/17 0039)  Intake/Output from previous day: 04/16 0701 - 04/17 0700 In: 1008.3 [I.V.:908.3; IV Piggyback:100] Out: -  Intake/Output this shift: No intake/output data recorded.  Recent Labs    12/24/17 1207 12/25/17 0900  HGB 9.1* 7.9*   Recent Labs    12/24/17 1207 12/25/17 0900  WBC 9.2 9.3  RBC 3.23* 2.74*  HCT 28.5* 24.3*  PLT 265 218   Recent Labs    12/24/17 1207  NA 136  K 3.6  CL 105  CO2 20*  BUN 24*  CREATININE 1.23*  GLUCOSE 112*  CALCIUM 8.7*   No results for input(s): LABPT, INR in the last 72 hours.  ABD soft Neurovascular intact Sensation intact distally Intact pulses distally right knee multiple open wounds with skin sloughing   Anticipated LOS equal to or greater than 2 midnights due to - Age 81 and older with one or more of the following:  - Obesity  - Expected need for hospital services (PT, OT, Nursing) required for safe  discharge  - Anticipated need for postoperative skilled nursing care or inpatient rehab  - Active co-morbidities: Coronary Artery Disease, Cardiac Arrhythmia and Anemia OR   - Unanticipated findings during/Post Surgery: Extensor mechanism rupture  - Patient is a high risk of re-admission due to: None    Assessment/Plan: Principal Problem:   Quadriceps tendon rupture Active Problems:   Essential hypertension   Hypotension   PAF (paroxysmal atrial fibrillation) (HCC)   Coronary artery disease involving native coronary artery of native heart with angina pectoris (HCC)   Chronic  anticoagulation   CKD (chronic kidney disease), stage II   Acute blood loss anemia due to right leg injury   Will give blood today for acute blood loss anemia in the setting of significant cardiac disease.  Will get a MRI of the knee to further quantify the quad tendon rupture.  Patient has mild quad function with significant weakness.  Will continue Ancef in the setting of cellulitis, MSSA positive and multiple open wounds in the area of up coming surgery.  Surgery Cancelled today.  Will restart diet and check skin daily.   Heman Que J Ranvir Renovato 12/25/2017, 9:45 AM

## 2017-12-25 NOTE — Progress Notes (Signed)
PROGRESS NOTE    Hailey [name]  SNK:[id number] DOB: 08/17/1935 DOA: 12/24/2017 PCP: Cassandria Anger, MD   Outpatient Specialists:     Brief Narrative:  Hailey [name] is a 83 y.o. female with medical history significant of HTN; CAD on Plavix; CKD; and afib on Eliquis presenting with a quadriceps tear from a fall.   She was doing some cleaning in her room and she tripped over a table.  She is on Eliquis and Plavix and was bleeding a lot.  She was able to walk afterwards but the pain and swelling started within 30 minutes afterward.  The fall was Sunday and she was brought to the ER that night.  She was found to have a partial rupture of the distal quadriceps muscle and large 8.9 cm prepatellar hematoma and was discharged with a posterior splint/knee immobilizer and waited to be seen by orthopedics today. It was very difficult to move her, so they had to call an ambulance service to transport her.  Dr. Noemi Chapel then told them that she needs a tendon repair tomorrow.  She has been cleared for surgery by cardiology.  Her last dose of Eliquis was yesterday AM.   She also got lightheaded at the orthopedic office - she was dehydrated and on pain killers and she was quickly moved from a lying to seated position.  She may have passed out for a moment.  This is not a new issue, according to the patient and her husband.       Assessment & Plan:   Principal Problem:   Quadriceps tendon rupture Active Problems:   Essential hypertension   Hypotension   PAF (paroxysmal atrial fibrillation) (HCC)   Coronary artery disease involving native coronary artery of native heart with angina pectoris (HCC)   Chronic anticoagulation   CKD (chronic kidney disease), stage II   Acute blood loss anemia due to right leg injury   Quadriceps tendon rupture -Mechanical fall resulting in right quad tear with prepatellar hematoma -Orthopedics plans to do operative repair once swelling/skin improved for lessened  chance of infection -Pain control with Robxain, Vicodin, and Morphine prn -Will need PT consult post-operatively -MRI pending -ancef for cellulitis (open wounds)  CAD/PAF on [facility] -Patient with fairly recent stent and also with afib -She is taking both Plavix and Eliquis at home -Orthopedics has approved ongoing use of Plavix so will continue -Cardiology has seen as well -eliquis for now at a lower dose until surgery timing decided  Hypotension with chronic HTN -episode of apparent orthostatic hypotension at Dr. Archie Endo office -Patient and husband report that this happens periodically -Her BP has normalized -Will continue Toprol for perioperative cardiovascular protection  CKD stage III -stable -monitor with BMPs  ABLA -transfuse per ortho -daily labs   Patient is not safe for d/c from the hospital until after surgery.  Surgery is not safe until swelling down and skin healing.  Suspect patient will need to be changed to inpatient,   DVT prophylaxis:  eliquis per ortho  Code Status: Full Code   Family Communication:   Disposition Plan:  Pending surgery and then PT Eval   Consultants:   ortho   Subjective: No chest pain, no SOB  Objective: Vitals:   12/25/17 0039 12/25/17 0613 12/25/17 1200 12/25/17 1235  BP: 119/76 127/82 98/61 114/82  Pulse: 89 67 67 65  Resp: 17 16 16 18   Temp: 98.6 F (37 C) 97.7 F (36.5 C) 98.4 F (36.9 C) 98.2 F (36.8  C)  TempSrc: Oral Oral Oral Oral  SpO2: 96% 91% 96% 99%  Weight: 75.8 kg (167 lb)     Height: 5\' 4"  (1.626 m)       Intake/Output Summary (Last 24 hours) at 12/25/2017 1324 Last data filed at 12/25/2017 0900 Gross per 24 hour  Intake 1008.33 ml  Output -  Net 1008.33 ml   Filed Weights   12/25/17 0039  Weight: 75.8 kg (167 lb)    Examination:  General exam: Appears calm and comfortable  Respiratory system: Clear to auscultation. Respiratory effort normal. Cardiovascular system: S1 & S2 heard,  RRR. No JVD, murmurs, rubs, gallops or clicks. No pedal edema. Gastrointestinal system: Abdomen is nondistended, soft and nontender. No organomegaly or masses felt. Normal bowel sounds heard. Central nervous system: Alert and oriented. No focal neurological deficits. Extremities: right leg wrapped and elevated Psychiatry: Judgement and insight appear normal. Mood & affect appropriate.     Data Reviewed: I have personally reviewed following labs and imaging studies  CBC: Recent Labs  Lab 12/24/17 1207 12/25/17 0900  WBC 9.2 9.3  NEUTROABS 6.6  --   HGB 9.1* 7.9*  HCT 28.5* 24.3*  MCV 88.2 88.7  PLT 265 222   Basic Metabolic Panel: Recent Labs  Lab 12/24/17 1207 12/25/17 0900  NA 136 136  K 3.6 3.8  CL 105 108  CO2 20* 20*  GLUCOSE 112* 104*  BUN 24* 17  CREATININE 1.23* 1.06*  CALCIUM 8.7* 8.1*   GFR: Estimated Creatinine Clearance: 41.5 mL/min (A) (by C-G formula based on SCr of 1.06 mg/dL (H)). Liver Function Tests: Recent Labs  Lab 12/24/17 1207  AST 19  ALT 14  ALKPHOS 52  BILITOT 0.7  PROT 5.7*  ALBUMIN 3.4*   No results for input(s): LIPASE, AMYLASE in the last 168 hours. No results for input(s): AMMONIA in the last 168 hours. Coagulation Profile: No results for input(s): INR, PROTIME in the last 168 hours. Cardiac Enzymes: Recent Labs  Lab 12/24/17 1207 12/25/17 0900  TROPONINI 0.03* <0.03   BNP (last 3 results) No results for input(s): PROBNP in the last 8760 hours. HbA1C: No results for input(s): HGBA1C in the last 72 hours. CBG: No results for input(s): GLUCAP in the last 168 hours. Lipid Profile: No results for input(s): CHOL, HDL, LDLCALC, TRIG, CHOLHDL, LDLDIRECT in the last 72 hours. Thyroid Function Tests: No results for input(s): TSH, T4TOTAL, FREET4, T3FREE, THYROIDAB in the last 72 hours. Anemia Panel: No results for input(s): VITAMINB12, FOLATE, FERRITIN, TIBC, IRON, RETICCTPCT in the last 72 hours. Urine analysis:      Component Value Date/Time   COLORURINE YELLOW 02/19/2017 [place] 02/19/2017 1431   LABSPEC 1.020 02/19/2017 1431   PHURINE 5.5 02/19/2017 1431   GLUCOSEU NEGATIVE 02/19/2017 1431   GLUCOSEU NEGATIVE 08/30/2014 1519   HGBUR 1+ (A) 02/19/2017 1431   BILIRUBINUR NEGATIVE 02/19/2017 1431   KETONESUR NEGATIVE 02/19/2017 1431   PROTEINUR NEGATIVE 02/19/2017 1431   UROBILINOGEN 0.2 08/30/2014 1519   NITRITE POSITIVE (A) 02/19/2017 1431   LEUKOCYTESUR 1+ (A) 02/19/2017 1431      Recent Results (from the past 240 hour(s))  Surgical pcr screen     Status: Abnormal   Collection Time: 12/25/17  7:30 AM  Result Value Ref Range Status   MRSA, PCR NEGATIVE NEGATIVE Final   Staphylococcus aureus POSITIVE (A) NEGATIVE Final    Comment: (NOTE) The Xpert SA Assay (FDA approved for NASAL specimens in patients 22 years of  age and older), is one component of a comprehensive surveillance program. It is not intended to diagnose infection nor to guide or monitor treatment.       Anti-infectives (From admission, onward)   Start     Dose/Rate Route Frequency Ordered Stop   12/24/17 2245  ceFAZolin (ANCEF) IVPB 2g/100 mL premix     2 g 200 mL/hr over 30 Minutes Intravenous Every 8 hours 12/24/17 2241         Radiology Studies: Dg Chest 2 View  Result Date: 12/24/2017 CLINICAL DATA:  Syncope.  Preop for ruptured quadriceps. EXAM: CHEST - 2 VIEW COMPARISON:  07/03/2014 FINDINGS: Stable borderline heart size and mild aortic tortuosity. There is no edema, consolidation, effusion, or pneumothorax. Spondylosis. IMPRESSION: No evidence of active disease. Electronically Signed   By: Monte Fantasia M.D.   On: 12/24/2017 13:02   Dg Tibia/fibula Right  Result Date: 12/25/2017 CLINICAL DATA:  Right leg pain after fall 3 days ago. EXAM: RIGHT TIBIA AND FIBULA - 2 VIEW COMPARISON:  None. FINDINGS: There is no evidence of fracture or other focal bone lesions. Soft tissues are unremarkable.  IMPRESSION: Normal right tibia and fibula. Electronically Signed   By: Marijo Conception, M.D.   On: 12/25/2017 08:47   Dg Hip Unilat With Pelvis 2-3 Views Right  Result Date: 12/25/2017 CLINICAL DATA:  Right leg pain after fall 3 days ago. EXAM: DG HIP (WITH OR WITHOUT PELVIS) 2-3V RIGHT COMPARISON:  None. FINDINGS: There is no evidence of hip fracture or dislocation. There is no evidence of arthropathy or other focal bone abnormality. IMPRESSION: Normal right hip. Electronically Signed   By: Marijo Conception, M.D.   On: 12/25/2017 08:49        Scheduled Meds: . apixaban  2.5 mg Oral BID  . atorvastatin  80 mg Oral q1800  . clopidogrel  75 mg Oral Daily  . docusate sodium  100 mg Oral BID  . DULoxetine  60 mg Oral Daily  . furosemide  20 mg Intravenous Once  . metoprolol succinate  75 mg Oral Daily  . pantoprazole  40 mg Oral Daily   Continuous Infusions: . sodium chloride 50 mL/hr at 12/25/17 0925  .  ceFAZolin (ANCEF) IV Stopped (12/25/17 2671)  . methocarbamol (ROBAXIN)  IV       LOS: 0 days    Time spent: 35 min    Geradine Girt, DO [facility] Pager [phone]  If 7PM-7AM, please contact night-coverage [email or web address] Password [email or web address] 12/25/2017, 1:24 PM

## 2017-12-25 NOTE — Care Plan (Signed)
Spoke with patient and her husband at the request of Dr. Noemi Chapel. They have secured a bed at [facility] rehab for recovery and, if needed, for time prior to surgery. I discussed with them the option of going to [facility] after surgery. They are agreeable to this if [facility] accepts their [facility] coverage. They agreed to letting me contact the facility for information. I will let them know the outcome.   Thank you

## 2017-12-25 NOTE — Progress Notes (Signed)
Nutrition Consult/Brief Note  RD consulted via Hip Fracture Protocol.  Wt Readings from Last 15 Encounters:  12/25/17 167 lb (75.8 kg)  12/22/17 167 lb (75.8 kg)  12/16/17 167 lb (75.8 kg)  11/18/17 171 lb 15.3 oz (78 kg)  11/07/17 169 lb (76.7 kg)  11/04/17 171 lb (77.6 kg)  08/14/17 169 lb 6.4 oz (76.8 kg)  07/30/17 172 lb 9.9 oz (78.3 kg)  07/23/17 171 lb 4 oz (77.7 kg)  07/11/17 170 lb (77.1 kg)  07/09/17 171 lb (77.6 kg)  07/02/17 168 lb 14 oz (76.6 kg)  06/26/17 169 lb (76.7 kg)  06/12/17 169 lb 6.4 oz (76.8 kg)  05/28/17 170 lb (77.1 kg)   Body mass index is 28.67 kg/m. Patient meets criteria for Overweight based on current BMI.   Current diet order is Heart Healthy, patient is consuming approximately 100% of meals at this time. Labs and medications reviewed.   No nutrition interventions warranted at this time. If nutrition issues arise, please consult RD.   Arthur Holms, RD, LDN Pager #: [phone] After-Hours Pager #: [phone]

## 2017-12-25 NOTE — Plan of Care (Signed)

## 2017-12-26 ENCOUNTER — Encounter (HOSPITAL_COMMUNITY): Payer: Self-pay | Admitting: Physician Assistant

## 2017-12-26 DIAGNOSIS — N182 Chronic kidney disease, stage 2 (mild): Secondary | ICD-10-CM

## 2017-12-26 DIAGNOSIS — L03115 Cellulitis of right lower limb: Secondary | ICD-10-CM

## 2017-12-26 HISTORY — DX: Cellulitis of right lower limb: L03.115

## 2017-12-26 LAB — BASIC METABOLIC PANEL
Anion gap: 8 (ref 5–15)
BUN: 18 mg/dL (ref 6–20)
CO2: 21 mmol/L — ABNORMAL LOW (ref 22–32)
Calcium: 8.2 mg/dL — ABNORMAL LOW (ref 8.9–10.3)
Chloride: 109 mmol/L (ref 101–111)
Creatinine, Ser: 1.15 mg/dL — ABNORMAL HIGH (ref 0.44–1.00)
GFR calc Af Amer: 50 mL/min — ABNORMAL LOW (ref >60)
GFR calc non Af Amer: 43 mL/min — ABNORMAL LOW (ref >60)
Glucose, Bld: 120 mg/dL — ABNORMAL HIGH (ref 65–99)
Potassium: 3.7 mmol/L (ref 3.5–5.1)
Sodium: 138 mmol/L (ref 135–145)

## 2017-12-26 LAB — TYPE AND SCREEN
ABO/RH(D): A POS
Antibody Screen: NEGATIVE
Unit division: 0
Unit division: 0

## 2017-12-26 LAB — CBC
HCT: 31.7 % — ABNORMAL LOW (ref 36.0–46.0)
Hemoglobin: 10.4 g/dL — ABNORMAL LOW (ref 12.0–15.0)
MCH: 29.3 pg (ref 26.0–34.0)
MCHC: 32.8 g/dL (ref 30.0–36.0)
MCV: 89.3 fL (ref 78.0–100.0)
Platelets: 215 K/uL (ref 150–400)
RBC: 3.55 MIL/uL — ABNORMAL LOW (ref 3.87–5.11)
RDW: 16 % — ABNORMAL HIGH (ref 11.5–15.5)
WBC: 10.8 K/uL — ABNORMAL HIGH (ref 4.0–10.5)

## 2017-12-26 LAB — BPAM RBC
Blood Product Expiration Date: 201905172359
Blood Product Expiration Date: 201905172359
ISSUE DATE / TIME: 201904171151
ISSUE DATE / TIME: 201904171748
Unit Type and Rh: 6200
Unit Type and Rh: 6200

## 2017-12-26 NOTE — Progress Notes (Addendum)
Subjective: Patient feeling better today.  Less knee pain and less fatigue  Objective: Vital signs in last 24 hours: Temp:  [97.7 F (36.5 C)-98.6 F (37 C)] 98.3 F (36.8 C) (04/18 0400) Pulse Rate:  [61-82] 74 (04/18 0400) Resp:  [16-18] 18 (04/18 0400) BP: (98-131)/(61-90) 131/90 (04/18 0400) SpO2:  [93 %-99 %] 98 % (04/18 0400)  Intake/Output from previous day: 04/17 0701 - 04/18 0700 In: 240 [P.O.:240] Out: 1000 [Urine:1000] Intake/Output this shift: No intake/output data recorded.  Recent Labs    12/24/17 1207 12/25/17 0900 12/26/17 0611  HGB 9.1* 7.9* 10.4*   Recent Labs    12/25/17 0900 12/26/17 0611  WBC 9.3 10.8*  RBC 2.74* 3.55*  HCT 24.3* 31.7*  PLT 218 215   Recent Labs    12/25/17 0900 12/26/17 0611  NA 136 138  K 3.8 3.7  CL 108 109  CO2 20* 21*  BUN 17 18  CREATININE 1.06* 1.15*  GLUCOSE 104* 120*  CALCIUM 8.1* 8.2*   No results for input(s): LABPT, INR in the last 72 hours.  ABD soft Neurovascular intact Sensation intact distally Intact pulses distally Dorsiflexion/Plantar flexion intact wounds still draining   anterior, superior knee wound still red but hematoma is smaller   Anticipated LOS equal to or greater than 2 midnights due to - Age 9 and older with one or more of the following:  - Obesity  - Expected need for hospital services (PT, OT, Nursing) required for safe  discharge  - Anticipated need for postoperative skilled nursing care or inpatient rehab  - Active co-morbidities: Cardiac Arrhythmia and Anemia     Assessment Principal Problem:   Cellulitis of leg, right with large prepatella hematoma and open wounds Active Problems:   Essential hypertension   Hypotension   PAF (paroxysmal atrial fibrillation) (HCC)   Coronary artery disease involving native coronary artery of native heart with angina pectoris (HCC)   Chronic anticoagulation   Quadriceps tendon rupture   CKD (chronic kidney disease), stage II  Injury of quadriceps tendon   Acute blood loss anemia due to right leg injury   Plan: This patient got up and ambulated with me with a knee immobilizer on and using a rolling walker for balance.  She did well with this.   She fatigued easily on the short walk.  She was able to do 10 SLR in the bed without the knee immobilizer.  Physical and Occupational therapy will work with her today.  OK for PT or OT to remove Knee immobilizer for therapeutic exercises.  Recommend knee immobilizer when ambulating with family or staff.  Plan is to keep today and tomorrow for IV antibiotics and therapy. Discharge is planned for tomorrow afternoon to [facility] for short term rehab.    Marlowe Lawes J Derriana Oser 12/26/2017, 9:20 AM

## 2017-12-26 NOTE — Evaluation (Signed)
Occupational Therapy Evaluation Patient Details Name: Hailey [name] MRN: [id number] DOB: [birth year] Today's Date: 12/26/2017    History of Present Illness Pt is an 83 y/o female with recent fall. Suspected quad tendon rupture in R knee initially, however, MRI revealed ACL tear and meniscus tear. Pt admitted following syncopal episode at MDs office. PMH includes CAD s/p stent placement, HTN, a fib, and CKD.    Clinical Impression   This 83 y/o female presents with the above. At baseline pt is independent with ADLs and functional mobility. Pt completing room level functional mobility using RW with overall Minguard-MinA assist; currently requires modA for LB ADLs secondary to RLE functional limitations and RLE pain. Pt reports plans for ST SNF stay after discharge. Feel this recommendation is appropriate to maximize pt's overall safety and independence with ADLs and functional mobility prior to return home. Will continue to follow acutely to progress pt towards established OT goals.     Follow Up Recommendations  SNF;Supervision/Assistance - 24 hour    Equipment Recommendations  Other (comment)(TBD in next venue )           Precautions / Restrictions Precautions Precautions: Fall Required Braces or Orthoses: Knee Immobilizer - Right Knee Immobilizer - Right: Other (comment)(until pt with good quad control ) Restrictions Weight Bearing Restrictions: Yes RLE Weight Bearing: Weight bearing as tolerated Other Position/Activity Restrictions: Per Kirstin Shepperson, PA, ok to perform ROM in R knee and pt is to be WBAT.       Mobility Bed Mobility Overal bed mobility: Needs Assistance Bed Mobility: Supine to Sit     Supine to sit: Min guard     General bed mobility comments: MinGuard for safety; pt able to advance bil LEs over EOB, increased time and effort   Transfers Overall transfer level: Needs assistance Equipment used: Rolling walker (2 wheeled) Transfers: Sit to/from  Stand Sit to Stand: Min assist         General transfer comment: Min A for lift assist and steadying. Verbal cues for appropriate hand placement and sequencing using KI.     Balance Overall balance assessment: Needs assistance Sitting-balance support: No upper extremity supported;Feet supported Sitting balance-Leahy Scale: Good     Standing balance support: Bilateral upper extremity supported;During functional activity;No upper extremity supported Standing balance-Leahy Scale: Fair Standing balance comment: Able to static stand without UE support and minguard for safety; Reliant on BUE support for dynamic mobility                            ADL either performed or assessed with clinical judgement   ADL Overall ADL's : Needs assistance/impaired Eating/Feeding: Modified independent;Sitting   Grooming: Wash/dry hands;Min guard;Standing   Upper Body Bathing: Min guard;Sitting   Lower Body Bathing: Minimal assistance;Sit to/from stand   Upper Body Dressing : Set up;Sitting   Lower Body Dressing: Moderate assistance;Sit to/from stand   Toilet Transfer: Min guard;Ambulation;BSC;RW Toilet Transfer Details (indicate cue type and reason): BSC over toilet  Toileting- Clothing Manipulation and Hygiene: Min guard;Sit to/from stand;Sitting/lateral lean Toileting - Clothing Manipulation Details (indicate cue type and reason): pt performing peri-care via lateral leans; minguard for safety during gown management while standing      Functional mobility during ADLs: Min guard;Rolling walker                           Pertinent Vitals/Pain Pain Assessment: Faces Faces Pain Scale:  Hurts even more Pain Location: R knee  Pain Descriptors / Indicators: Grimacing;Guarding Pain Intervention(s): Patient requesting pain meds-RN notified;Repositioned;Monitored during session          Extremity/Trunk Assessment Upper Extremity Assessment Upper Extremity Assessment: Overall  WFL for tasks assessed   Lower Extremity Assessment Lower Extremity Assessment: Defer to PT evaluation RLE Deficits / Details: Knee mobility limited secondary to pain and guarding. ONly able to perform partial heel slide.    Cervical / Trunk Assessment Cervical / Trunk Assessment: Normal   Communication Communication Communication: No difficulties   Cognition Arousal/Alertness: Awake/alert Behavior During Therapy: WFL for tasks assessed/performed Overall Cognitive Status: Within Functional Limits for tasks assessed                                                    Home Living Family/patient expects to be discharged to:: [place]: Spouse/significant other                               Additional Comments: REports she plans to go to [place].       Prior Functioning/Environment Level of Independence: Independent        Comments: Pt reports she was very active and working with a Physiological scientist 3 days/week.         OT Problem List: Impaired balance (sitting and/or standing);Decreased strength;Decreased activity tolerance;Decreased knowledge of use of DME or AE;Pain      OT Treatment/Interventions: Self-care/ADL training;DME and/or AE instruction;Therapeutic activities;Balance training;Therapeutic exercise;Energy conservation;Patient/family education    OT Goals(Current goals can be found in the care plan section) Acute Rehab OT Goals Patient Stated Goal: to get better  OT Goal Formulation: With patient Time For Goal Achievement: 01/09/18 Potential to Achieve Goals: Good  OT Frequency: Min 2X/week                             AM-PAC PT "6 Clicks" Daily Activity     Outcome Measure Help from another person eating meals?: None Help from another person taking care of personal grooming?: A Little Help from another person toileting, which includes using toliet, bedpan, or urinal?: A  Little Help from another person bathing (including washing, rinsing, drying)?: A Lot Help from another person to put on and taking off regular upper body clothing?: None Help from another person to put on and taking off regular lower body clothing?: A Lot 6 Click Score: 18   End of Session Equipment Utilized During Treatment: Gait belt;Rolling walker;Right knee immobilizer Nurse Communication: Mobility status;Patient requests pain meds  Activity Tolerance: Patient tolerated treatment well Patient left: in chair;with call bell/phone within reach  OT Visit Diagnosis: Other abnormalities of gait and mobility (R26.89);Pain Pain - Right/Left: Right Pain - part of body: Knee                Time: 6720-9470 OT Time Calculation (min): 15 min Charges:  OT General Charges $OT Visit: 1 Visit OT Evaluation $OT Eval Low Complexity: 1 Low G-Codes:     Lou Cal, OT Pager [phone] 12/26/2017   Raymondo Band 12/26/2017, 3:51 PM

## 2017-12-26 NOTE — Discharge Instructions (Signed)

## 2017-12-26 NOTE — Evaluation (Signed)
Physical Therapy Evaluation Patient Details Name: Hailey [name] MRN: [id number] DOB: 04/18/1935 Today's Date: 12/26/2017   History of Present Illness  Pt is an 83 y/o female with recent fall. Suspected quad tendon rupture in R knee initially, however, MRI revealed ACL tear and meniscus tear. Pt admitted following syncopal episode at MDs office. PMH includes CAD s/p stent placement, HTN, a fib, and CKD.   Clinical Impression  Pt admitted secondary to problem above with deficits below. Pt unsteady and presenting with decreased weightshift secondary to increased pain in R knee. Required min to min guard for mobility with RW and distance limited secondary to fatigue. Pt reports her plan is to go to [facility] prior to return home. Will continue to follow acutely to maximize functional mobility independence and safety.     Follow Up Recommendations SNF;Supervision for mobility/OOB    Equipment Recommendations  None recommended by PT    Recommendations for Other Services       Precautions / Restrictions Precautions Precautions: Fall Required Braces or Orthoses: Knee Immobilizer - Right Knee Immobilizer - Right: Other (comment)(until pt with good quad control ) Restrictions Weight Bearing Restrictions: Yes RLE Weight Bearing: Weight bearing as tolerated Other Position/Activity Restrictions: Per Kirstin Shepperson, PA, ok to perform ROM in R knee and pt is to be WBAT.       Mobility  Bed Mobility               General bed mobility comments: In chair upon entry.   Transfers Overall transfer level: Needs assistance Equipment used: Rolling walker (2 wheeled) Transfers: Sit to/from Stand Sit to Stand: Min assist         General transfer comment: Min A for lift assist and steadying. Verbal cues for appropriate hand placement and sequencing using KI.   Ambulation/Gait Ambulation/Gait assistance: Min assist;Min guard Ambulation Distance (Feet): 100 Feet Assistive device:  Rolling walker (2 wheeled) Gait Pattern/deviations: Step-to pattern;Decreased step length - right;Decreased step length - left;Decreased weight shift to right;Antalgic Gait velocity: Decreased    General Gait Details: Slow, antalgic gait secondary to pain in R knee. Required min to min guard for steadying assist and required verbal cues for proximity to device. Further distance limited secondary to fatigue.  Stairs            Wheelchair Mobility    Modified Rankin (Stroke Patients Only)       Balance Overall balance assessment: Needs assistance Sitting-balance support: No upper extremity supported;Feet supported Sitting balance-Leahy Scale: Good     Standing balance support: Bilateral upper extremity supported;During functional activity Standing balance-Leahy Scale: Poor Standing balance comment: Reliant on BUE support.                              Pertinent Vitals/Pain Pain Assessment: Faces Faces Pain Scale: Hurts little more Pain Location: R knee  Pain Descriptors / Indicators: Grimacing;Guarding Pain Intervention(s): Limited activity within patient's tolerance;Monitored during session;Repositioned    Home Living Family/patient expects to be discharged to:: Skilled nursing facility                 Additional Comments: REports she plans to go to [facility]     Prior Function Level of Independence: Independent         Comments: Pt reports she was very active and working with a Physiological scientist 3 days/week.      Hand Dominance  Extremity/Trunk Assessment   Upper Extremity Assessment Upper Extremity Assessment: Defer to OT evaluation    Lower Extremity Assessment Lower Extremity Assessment: RLE deficits/detail RLE Deficits / Details: Knee mobility limited secondary to pain and guarding. ONly able to perform partial heel slide.     Cervical / Trunk Assessment Cervical / Trunk Assessment: Normal  Communication    Communication: No difficulties  Cognition Arousal/Alertness: Awake/alert Behavior During Therapy: WFL for tasks assessed/performed Overall Cognitive Status: Within Functional Limits for tasks assessed                                        General Comments General comments (skin integrity, edema, etc.): Pt's husband present at end of session. Educated about HEP and about how to apply [place].     Exercises General Exercises - Lower Extremity Ankle Circles/Pumps: AROM;Both;20 reps Heel Slides: AAROM;Right;10 reps(limited ROM )   Assessment/Plan    PT Assessment Patient needs continued PT services  PT Problem List Decreased strength;Decreased balance;Decreased activity tolerance;Decreased mobility;Decreased knowledge of use of DME;Decreased knowledge of precautions;Pain       PT Treatment Interventions DME instruction;Gait training;Functional mobility training;Stair training;Therapeutic activities;Therapeutic exercise;Balance training;Neuromuscular re-education;Patient/family education    PT Goals (Current goals can be found in the Care Plan section)  Acute Rehab PT Goals Patient Stated Goal: to get better  PT Goal Formulation: With patient Time For Goal Achievement: 01/09/18 Potential to Achieve Goals: Good    Frequency Min 3X/week   Barriers to discharge        Co-evaluation               AM-PAC PT "6 Clicks" Daily Activity  Outcome Measure Difficulty turning over in bed (including adjusting bedclothes, sheets and blankets)?: A Little Difficulty moving from lying on back to sitting on the side of the bed? : Unable Difficulty sitting down on and standing up from a chair with arms (e.g., wheelchair, bedside commode, etc,.)?: Unable Help needed moving to and from a bed to chair (including a wheelchair)?: A Little Help needed walking in hospital room?: A Little Help needed climbing 3-5 steps with a railing? : A Lot 6 Click Score: 13    End of Session  Equipment Utilized During Treatment: Gait belt;Right knee immobilizer Activity Tolerance: Patient limited by fatigue Patient left: in chair;with call bell/phone within reach;with family/visitor present Nurse Communication: Mobility status PT Visit Diagnosis: Other abnormalities of gait and mobility (R26.89);Unsteadiness on feet (R26.81);Muscle weakness (generalized) (M62.81)    Time: 1130-1150 PT Time Calculation (min) (ACUTE ONLY): 20 min   Charges:   PT Evaluation $PT Eval Low Complexity: 1 Low     PT G Codes:        Leighton Ruff, PT, DPT  Acute Rehabilitation Services  Pager: [phone]   Rudean Hitt 12/26/2017, 1:36 PM

## 2017-12-26 NOTE — Progress Notes (Signed)
Orthopedic Tech Progress Note Patient Details:  Hailey [name] May 06, 1935 [id number]  Ortho Devices Type of Ortho Device: Knee Immobilizer Ortho Device/Splint Location: rle Ortho Device/Splint Interventions: Application   Post Interventions Patient Tolerated: Well Instructions Provided: Care of device   Hildred Priest 12/26/2017, 8:50 AM

## 2017-12-26 NOTE — Clinical Social Work Note (Signed)
Clinical Social Work Assessment  Patient Details  Name: Hailey [name] MRN: [id number] Date of Birth: November 20, 1934  Date of referral:  12/26/17               Reason for consult:  Facility Placement                Permission sought to share information with:  Chartered certified accountant granted to share information::  Yes, Verbal Permission Granted  Name::     Delma Freeze  Agency::  SNF  Relationship::     Contact Information:     Housing/Transportation Living arrangements for the past 2 months:  [place] of Information:  Patient, Spouse Patient Interpreter Needed:  None Criminal Activity/Legal Involvement Pertinent to Current Situation/Hospitalization:  No - Comment as needed Significant Relationships:  Adult Children, Spouse, Other Family Members Lives with:  Spouse Do you feel safe going back to the place where you live?  No Need for family participation in patient care:  No (Coment)  Care giving concerns:  Pt with new impairment and will need rehabilitative therapies.  Social Worker assessment / plan:  CSW met with patient and spouse at bedside to discuss disposition. Pt agreeable to SNF as she has prearranged for placement at [facility] facility for short term rehab. Pt was independent prior to hospitalization. CSW will send clinicals to SNF once therapies has evaluated patient. CSW confirmed with Butch Penny in admission at [facility] the placement.    CSW will f/u for disposition.  Employment status:  Retired Nurse, adult PT Recommendations:  [place] / Referral to community resources:  [place]  Patient/Family's Response to care:  Psychologist, prison and probation services of CSW meeting to assist with disposition to SNF. Pt agreeable to SNF and pre-arranged.  Patient/Family's Understanding of and Emotional Response to Diagnosis, Current Treatment, and Prognosis:  Patient/family has good  understanding of diagnosis and impairment and agreeable to SNF at discharge. Pt has prearranged for placement at [facility] for short term rehab. Pt indicated that she may want to ride in a car to SNF and may not want [facility]. She will decide. CSW will assist with disposition. The plan is for patient to improve and return home after discharge. CSW will continue to follow for disposition.  Emotional Assessment Appearance:  Appears stated age Attitude/Demeanor/Rapport:  (Cooperative) Affect (typically observed):  Accepting, Appropriate Orientation:  Oriented to Self, Oriented to Place, Oriented to  Time, Oriented to Situation Alcohol / Substance use:  Not Applicable Psych involvement (Current and /or in the community):  No (Comment)  Discharge Needs  Concerns to be addressed:  Discharge Planning Concerns Readmission within the last 30 days:  No Current discharge risk:  Dependent with Mobility, Physical Impairment Barriers to Discharge:  No Barriers Identified   Normajean Baxter, LCSW 12/26/2017, 11:37 AM

## 2017-12-26 NOTE — Progress Notes (Signed)
PROGRESS NOTE    Hailey [name]  HYW:[id number] DOB: 1935-02-02 DOA: 12/24/2017 PCP: Cassandria Anger, MD   Outpatient Specialists:     Brief Narrative:  Hailey [name] is a 83 y.o. female with medical history significant of HTN; CAD on Plavix; CKD; and afib on Eliquis presenting with a quadriceps tear from a fall.   She was doing some cleaning in her room and she tripped over a table.  She is on Eliquis and Plavix and was bleeding a lot.  She was able to walk afterwards but the pain and swelling started within 30 minutes afterward.  The fall was Sunday and she was brought to the ER that night.  She was found to have a partial rupture of the distal quadriceps muscle and large 8.9 cm prepatellar hematoma and was discharged with a posterior splint/knee immobilizer and waited to be seen by orthopedics today. It was very difficult to move her, so they had to call an ambulance service to transport her.  Dr. Noemi Chapel then told them that she needs a tendon repair tomorrow.  She has been cleared for surgery by cardiology.  Her last dose of Eliquis was yesterday AM.   She also got lightheaded at the orthopedic office - she was dehydrated and on pain killers and she was quickly moved from a lying to seated position.  She may have passed out for a moment.  This is not a new issue, according to the patient and her husband.       Assessment & Plan:   Principal Problem:   Cellulitis of leg, right with large prepatella hematoma and open wounds Active Problems:   Essential hypertension   Hypotension   PAF (paroxysmal atrial fibrillation) (HCC)   Coronary artery disease involving native coronary artery of native heart with angina pectoris (HCC)   Chronic anticoagulation   Quadriceps tendon rupture   CKD (chronic kidney disease), stage II   Injury of quadriceps tendon   Acute blood loss anemia due to right leg injury   Cellulitis of right leg with pre-patellar hematoma s/p fall -Mechanical fall  resulting in  above along with ACL tear -no quad rupture, so no need for surgery -Pain control with Robxain, Vicodin -PT consult--- -ancef for cellulitis (open wounds) through tomm when she goes to rehab  CAD/PAF on [facility] -Patient with fairly recent stent and also with afib -She is taking both Plavix and Eliquis at home -Orthopedics has approved ongoing use of Plavix so will continue -Cardiology has seen as well -eliquis for now at a lower dose until hematoma resolved  Hypotension with chronic HTN -episode of apparent orthostatic hypotension at Dr. Archie Endo office -Patient and husband report that this happens periodically -Her BP has normalized -continue toprol  CKD stage III -stable -monitor with BMPs  ABLA -transfused -daily labs   Plan to d/c tomm after IV abx   DVT prophylaxis:  eliquis per ortho  Code Status: Full Code   Family Communication: At bedside  Disposition Plan:  Plan to go to SNF tomm after IV abx   Consultants:   ortho   Subjective: Sitting on side of bed, no chest pain  Objective: Vitals:   12/25/17 1800 12/25/17 1830 12/25/17 2039 12/26/17 0400  BP: 103/70 107/66 110/75 131/90  Pulse: 76 78 82 74  Resp: 17 16 16 18   Temp: 98.4 F (36.9 C) 98.3 F (36.8 C) 98.6 F (37 C) 98.3 F (36.8 C)  TempSrc: Oral Oral Oral Axillary  SpO2:  95% 94% 96% 98%  Weight:      Height:        Intake/Output Summary (Last 24 hours) at 12/26/2017 1223 Last data filed at 12/26/2017 0900 Gross per 24 hour  Intake 480 ml  Output 1000 ml  Net -520 ml   Filed Weights   12/25/17 0039  Weight: 75.8 kg (167 lb)    Examination:  General exam: Appears calm and comfortable  Respiratory system: Clear to auscultation. Respiratory effort normal. Cardiovascular system: S1 & S2 heard, RRR. No JVD, murmurs, rubs, gallops or clicks. No pedal edema. Gastrointestinal system: Abdomen is nondistended, soft and nontender. No organomegaly or masses felt. Normal  bowel sounds heard. Central nervous system: Alert and oriented. No focal neurological deficits. Extremities: right leg wrapped and elevated Psychiatry: Judgement and insight appear normal. Mood & affect appropriate.     Data Reviewed: I have personally reviewed following labs and imaging studies  CBC: Recent Labs  Lab 12/24/17 1207 12/25/17 0900 12/26/17 0611  WBC 9.2 9.3 10.8*  NEUTROABS 6.6  --   --   HGB 9.1* 7.9* 10.4*  HCT 28.5* 24.3* 31.7*  MCV 88.2 88.7 89.3  PLT 265 218 676   Basic Metabolic Panel: Recent Labs  Lab 12/24/17 1207 12/25/17 0900 12/26/17 0611  NA 136 136 138  K 3.6 3.8 3.7  CL 105 108 109  CO2 20* 20* 21*  GLUCOSE 112* 104* 120*  BUN 24* 17 18  CREATININE 1.23* 1.06* 1.15*  CALCIUM 8.7* 8.1* 8.2*   GFR: Estimated Creatinine Clearance: 38.2 mL/min (A) (by C-G formula based on SCr of 1.15 mg/dL (H)). Liver Function Tests: Recent Labs  Lab 12/24/17 1207  AST 19  ALT 14  ALKPHOS 52  BILITOT 0.7  PROT 5.7*  ALBUMIN 3.4*   No results for input(s): LIPASE, AMYLASE in the last 168 hours. No results for input(s): AMMONIA in the last 168 hours. Coagulation Profile: No results for input(s): INR, PROTIME in the last 168 hours. Cardiac Enzymes: Recent Labs  Lab 12/24/17 1207 12/25/17 0900  TROPONINI 0.03* <0.03   BNP (last 3 results) No results for input(s): PROBNP in the last 8760 hours. HbA1C: No results for input(s): HGBA1C in the last 72 hours. CBG: No results for input(s): GLUCAP in the last 168 hours. Lipid Profile: No results for input(s): CHOL, HDL, LDLCALC, TRIG, CHOLHDL, LDLDIRECT in the last 72 hours. Thyroid Function Tests: No results for input(s): TSH, T4TOTAL, FREET4, T3FREE, THYROIDAB in the last 72 hours. Anemia Panel: No results for input(s): VITAMINB12, FOLATE, FERRITIN, TIBC, IRON, RETICCTPCT in the last 72 hours. Urine analysis:    Component Value Date/Time   COLORURINE YELLOW 02/19/2017 [place] 02/19/2017 1431   LABSPEC 1.020 02/19/2017 1431   PHURINE 5.5 02/19/2017 1431   GLUCOSEU NEGATIVE 02/19/2017 1431   GLUCOSEU NEGATIVE 08/30/2014 1519   HGBUR 1+ (A) 02/19/2017 1431   BILIRUBINUR NEGATIVE 02/19/2017 1431   KETONESUR NEGATIVE 02/19/2017 1431   PROTEINUR NEGATIVE 02/19/2017 1431   UROBILINOGEN 0.2 08/30/2014 1519   NITRITE POSITIVE (A) 02/19/2017 1431   LEUKOCYTESUR 1+ (A) 02/19/2017 1431      Recent Results (from the past 240 hour(s))  Surgical pcr screen     Status: Abnormal   Collection Time: 12/25/17  7:30 AM  Result Value Ref Range Status   MRSA, PCR NEGATIVE NEGATIVE Final   Staphylococcus aureus POSITIVE (A) NEGATIVE Final    Comment: (NOTE) The Xpert SA Assay (FDA approved for NASAL specimens  in patients 67 years of age and older), is one component of a comprehensive surveillance program. It is not intended to diagnose infection nor to guide or monitor treatment.       Anti-infectives (From admission, onward)   Start     Dose/Rate Route Frequency Ordered Stop   12/24/17 2245  ceFAZolin (ANCEF) IVPB 2g/100 mL premix     2 g 200 mL/hr over 30 Minutes Intravenous Every 8 hours 12/24/17 2241         Radiology Studies: Dg Chest 2 View  Result Date: 12/24/2017 CLINICAL DATA:  Syncope.  Preop for ruptured quadriceps. EXAM: CHEST - 2 VIEW COMPARISON:  07/03/2014 FINDINGS: Stable borderline heart size and mild aortic tortuosity. There is no edema, consolidation, effusion, or pneumothorax. Spondylosis. IMPRESSION: No evidence of active disease. Electronically Signed   By: Monte Fantasia M.D.   On: 12/24/2017 13:02   Dg Tibia/fibula Right  Result Date: 12/25/2017 CLINICAL DATA:  Right leg pain after fall 3 days ago. EXAM: RIGHT TIBIA AND FIBULA - 2 VIEW COMPARISON:  None. FINDINGS: There is no evidence of fracture or other focal bone lesions. Soft tissues are unremarkable. IMPRESSION: Normal right tibia and fibula. Electronically Signed   By: Marijo Conception, M.D.   On: 12/25/2017 08:47   Mr Knee Right Wo Contrast  Result Date: 12/25/2017 CLINICAL DATA:  83 year old female status post fall presents with pain and swelling EXAM: MRI OF THE RIGHT KNEE WITHOUT CONTRAST TECHNIQUE: Multiplanar, multisequence MR imaging of the knee was performed. No intravenous contrast was administered. COMPARISON:  None. FINDINGS: MENISCI Medial meniscus: Flap tear of the anterior horn involving the free edge and inferior articular surface, series 7, images 26 and 27 with secondary horizontal component. Free edge tear of the body of the medial meniscus, series 6/18. Inferior articular surfacing tear also noted of the posterior horn, series 7/27. Lateral meniscus: Free edge tear of the anterior horn and body of the lateral meniscus, series 6/16 and 17 and series 7/15. LIGAMENTS Cruciates:  Full-thickness tear of the mid ACL.  Intact PCL. Collaterals:  Intact CARTILAGE Patellofemoral: The grade 2 and grade 3 chondral thinning and fissuring along the lower pole of the patella overlying the median ridge and lateral patellar facet, series 3/33 intact trochlear cartilage. Medial: Mild partial-thickness cartilage loss of the medial femorotibial compartment. Lateral:  No chondral defect Joint:  No joint effusion Popliteal Fossa: Trace fluid in the expected location of a popliteal cyst. Intact popliteus. Extensor Mechanism: Intact quadriceps and patellar tendons. Grade 2 sprain MPFL. Bones: No fracture, suspicious osseous lesion nor focal marrow abnormality. Other: Prepatellar hematoma measuring at least 8.9 x 2 x 7.7 cm. IMPRESSION: 1. Full-thickness tear of the ACL. 2. Intact quadriceps and patellar tendons. Grade 2 sprain of the medial patellofemoral ligament. 3. Flap tear of the anterior horn of the medial meniscus with inferior articular surfacing tear of the posterior horn and free edge tear of the body. 4. Free edge tear of the anterior horn and body of the lateral meniscus. 5.  Mild chondrosis of the patellofemoral and medial femorotibial compartments. 6. Prepatellar hematoma approximately measuring 8.9 x 2 x 7.7 cm. Electronically Signed   By: Ashley Royalty M.D.   On: 12/25/2017 18:15   Dg Hip Unilat With Pelvis 2-3 Views Right  Result Date: 12/25/2017 CLINICAL DATA:  Right leg pain after fall 3 days ago. EXAM: DG HIP (WITH OR WITHOUT PELVIS) 2-3V RIGHT COMPARISON:  None. FINDINGS: There is no evidence of  hip fracture or dislocation. There is no evidence of arthropathy or other focal bone abnormality. IMPRESSION: Normal right hip. Electronically Signed   By: Marijo Conception, M.D.   On: 12/25/2017 08:49        Scheduled Meds: . apixaban  2.5 mg Oral BID  . atorvastatin  80 mg Oral q1800  . clopidogrel  75 mg Oral Daily  . docusate sodium  100 mg Oral BID  . DULoxetine  60 mg Oral Daily  . metoprolol succinate  75 mg Oral Daily  . pantoprazole  40 mg Oral Daily   Continuous Infusions: .  ceFAZolin (ANCEF) IV 2 g (12/26/17 0537)  . methocarbamol (ROBAXIN)  IV       LOS: 1 day    Time spent: 35 min    Geradine Girt, DO [facility] Pager [phone]  If 7PM-7AM, please contact night-coverage [email or web address] Password [facility] 12/26/2017, 12:23 PM

## 2017-12-27 ENCOUNTER — Encounter (HOSPITAL_COMMUNITY): Payer: Self-pay | Admitting: Physician Assistant

## 2017-12-27 DIAGNOSIS — R001 Bradycardia, unspecified: Secondary | ICD-10-CM | POA: Diagnosis not present

## 2017-12-27 DIAGNOSIS — I4510 Unspecified right bundle-branch block: Secondary | ICD-10-CM

## 2017-12-27 HISTORY — DX: Unspecified right bundle-branch block: I45.10

## 2017-12-27 HISTORY — DX: Bradycardia, unspecified: R00.1

## 2017-12-27 LAB — CBC WITH DIFFERENTIAL/PLATELET
Basophils Absolute: 0 10*3/uL (ref 0.0–0.1)
Basophils Relative: 0 %
Eosinophils Absolute: 0.5 10*3/uL (ref 0.0–0.7)
Eosinophils Relative: 4 %
HCT: 31.8 % — ABNORMAL LOW (ref 36.0–46.0)
Hemoglobin: 10.5 g/dL — ABNORMAL LOW (ref 12.0–15.0)
Lymphocytes Relative: 14 %
Lymphs Abs: 1.5 10*3/uL (ref 0.7–4.0)
MCH: 29.7 pg (ref 26.0–34.0)
MCHC: 33 g/dL (ref 30.0–36.0)
MCV: 89.8 fL (ref 78.0–100.0)
Monocytes Absolute: 0.7 10*3/uL (ref 0.1–1.0)
Monocytes Relative: 6 %
Neutro Abs: 8.6 10*3/uL — ABNORMAL HIGH (ref 1.7–7.7)
Neutrophils Relative %: 76 %
Platelets: 245 10*3/uL (ref 150–400)
RBC: 3.54 MIL/uL — ABNORMAL LOW (ref 3.87–5.11)
RDW: 16.6 % — ABNORMAL HIGH (ref 11.5–15.5)
WBC: 11.3 10*3/uL — ABNORMAL HIGH (ref 4.0–10.5)

## 2017-12-27 MED ORDER — SODIUM CHLORIDE 0.9 % IV SOLN
2.0000 g | INTRAVENOUS | Status: DC
  Administered 2017-12-27: 2 g via INTRAVENOUS
  Filled 2017-12-27 (×2): qty 20

## 2017-12-27 MED ORDER — ACETAMINOPHEN 160 MG/5ML PO SOLN
1000.0000 mg | Freq: Four times a day (QID) | ORAL | Status: DC | PRN
  Administered 2017-12-28: 1000 mg via ORAL
  Filled 2017-12-27: qty 40.6

## 2017-12-27 MED ORDER — CALCIUM CARBONATE ANTACID 500 MG PO CHEW
1.0000 | CHEWABLE_TABLET | Freq: Two times a day (BID) | ORAL | Status: DC | PRN
  Administered 2017-12-27: 200 mg via ORAL
  Filled 2017-12-27: qty 1

## 2017-12-27 MED ORDER — FAMOTIDINE 20 MG PO TABS
20.0000 mg | ORAL_TABLET | Freq: Every day | ORAL | Status: DC
  Administered 2017-12-27 – 2017-12-28 (×2): 20 mg via ORAL
  Filled 2017-12-27 (×2): qty 1

## 2017-12-27 MED ORDER — SODIUM CHLORIDE 0.9 % IV SOLN: 1.0000 g | INTRAVENOUS | Status: DC

## 2017-12-27 MED ORDER — DOXYCYCLINE HYCLATE 100 MG PO TABS
100.0000 mg | ORAL_TABLET | Freq: Two times a day (BID) | ORAL | Status: DC
  Administered 2017-12-27 – 2017-12-28 (×3): 100 mg via ORAL
  Filled 2017-12-27 (×3): qty 1

## 2017-12-27 MED ORDER — OXYCODONE HCL 5 MG PO TABS: 5.0000 mg | ORAL_TABLET | ORAL | Status: DC | PRN

## 2017-12-27 MED ORDER — ACETAMINOPHEN 500 MG PO TABS
1000.0000 mg | ORAL_TABLET | Freq: Four times a day (QID) | ORAL | Status: DC | PRN
  Administered 2017-12-27: 1000 mg via ORAL
  Filled 2017-12-27: qty 2

## 2017-12-27 MED ORDER — POLYETHYLENE GLYCOL 3350 17 G PO PACK
17.0000 g | PACK | Freq: Two times a day (BID) | ORAL | Status: DC
  Administered 2017-12-27 – 2017-12-28 (×2): 17 g via ORAL
  Filled 2017-12-27 (×3): qty 1

## 2017-12-27 NOTE — Clinical Social Work Note (Signed)
Per PA note pt d/c is cancelled for today. CSW to follow up in the AM. Facility notified.   [place], [place]

## 2017-12-27 NOTE — Progress Notes (Signed)
PROGRESS NOTE    Hailey [name]  VEL:[id number] DOB: June 06, 1935 DOA: 12/24/2017 PCP: Cassandria Anger, MD   Outpatient Specialists:     Brief Narrative:  Hailey [name] is a 83 y.o. female with medical history significant of HTN; CAD on Plavix; CKD; and afib on Eliquis presenting with a quadriceps tear from a fall.   She was doing some cleaning in her room and she tripped over a table.  MRI shows ACL tear with large pre-patellar hematoma, also with skin tear and cellulitis.       Assessment & Plan:   Principal Problem:   Cellulitis of leg, right with large prepatella hematoma and open wounds Active Problems:   Essential hypertension   Hypotension   PAF (paroxysmal atrial fibrillation) (HCC)   Coronary artery disease involving native coronary artery of native heart with angina pectoris (HCC)   Chronic anticoagulation   Quadriceps tendon rupture   CKD (chronic kidney disease), stage II   Injury of quadriceps tendon   Acute blood loss anemia due to right leg injury   Bradycardia with 41-50 beats per minute   Right bundle branch block (RBBB) on electrocardiogram (ECG)   Cellulitis of right leg with pre-patellar hematoma s/p fall -Mechanical fall resulting in  above along with ACL tear -no quad rupture, so no need for surgery -Pain control with tylenol/oxy -PT consult-- will need SNF -IV abx changed from ancef to rocephin plus doxy -CBC in AM  CAD/PAF on [facility] -Patient with fairly recent stent and also with afib -She is taking both Plavix and Eliquis at home -Orthopedics has approved ongoing use of Plavix so will continue -Cardiology has seen as well -eliquis for now at a lower dose until hematoma resolved per ortho  Hypotension with chronic HTN -episode of apparent orthostatic hypotension at Dr. Archie Endo office -Patient and husband report that this happens periodically -Her BP has normalized -continue toprol -HR dropped to 50 while sleeping deeply--- no issues  with HR when awake so will not make adjustments to BB  CKD stage III -stable -monitor with BMPs  ABLA -transfused -daily labs      DVT prophylaxis:  eliquis per ortho  Code Status: Full Code   Family Communication: At bedside  Disposition Plan:  Plan to go to SNF when ok with ortho   Consultants:   ortho   Subjective: Worried about HR after nurse woke her up  Oxy makes her nauseous  Objective: Vitals:   12/26/17 1403 12/26/17 2138 12/27/17 0651 12/27/17 0900  BP: 110/76 (!) 113/58 (!) 120/53   Pulse: 75 71 67 (!) 2  Resp: 16  16   Temp: 97.8 F (36.6 C) 98.8 F (37.1 C) 98.3 F (36.8 C)   TempSrc: Oral Oral Oral   SpO2: 92% 94% 95%   Weight:      Height:        Intake/Output Summary (Last 24 hours) at 12/27/2017 1301 Last data filed at 12/27/2017 0900 Gross per 24 hour  Intake 580 ml  Output -  Net 580 ml   Filed Weights   12/25/17 0039  Weight: 75.8 kg (167 lb)    Examination:  General exam: in chair, NAD  Respiratory system: no increased work of breathing Cardiovascular system: irr but not fast Gastrointestinal system: +BS, soft Central nervous system: alert Extremities: right leg elevated and wrapped (nursing reports no streaking of redness when her bandage was changed)     Data Reviewed: I have personally reviewed following labs and  imaging studies  CBC: Recent Labs  Lab 12/24/17 1207 12/25/17 0900 12/26/17 0611 12/27/17 0940  WBC 9.2 9.3 10.8* 11.3*  NEUTROABS 6.6  --   --  8.6*  HGB 9.1* 7.9* 10.4* 10.5*  HCT 28.5* 24.3* 31.7* 31.8*  MCV 88.2 88.7 89.3 89.8  PLT 265 218 215 403   Basic Metabolic Panel: Recent Labs  Lab 12/24/17 1207 12/25/17 0900 12/26/17 0611  NA 136 136 138  K 3.6 3.8 3.7  CL 105 108 109  CO2 20* 20* 21*  GLUCOSE 112* 104* 120*  BUN 24* 17 18  CREATININE 1.23* 1.06* 1.15*  CALCIUM 8.7* 8.1* 8.2*   GFR: Estimated Creatinine Clearance: 38.2 mL/min (A) (by C-G formula based on SCr of  1.15 mg/dL (H)). Liver Function Tests: Recent Labs  Lab 12/24/17 1207  AST 19  ALT 14  ALKPHOS 52  BILITOT 0.7  PROT 5.7*  ALBUMIN 3.4*   No results for input(s): LIPASE, AMYLASE in the last 168 hours. No results for input(s): AMMONIA in the last 168 hours. Coagulation Profile: No results for input(s): INR, PROTIME in the last 168 hours. Cardiac Enzymes: Recent Labs  Lab 12/24/17 1207 12/25/17 0900  TROPONINI 0.03* <0.03   BNP (last 3 results) No results for input(s): PROBNP in the last 8760 hours. HbA1C: No results for input(s): HGBA1C in the last 72 hours. CBG: No results for input(s): GLUCAP in the last 168 hours. Lipid Profile: No results for input(s): CHOL, HDL, LDLCALC, TRIG, CHOLHDL, LDLDIRECT in the last 72 hours. Thyroid Function Tests: No results for input(s): TSH, T4TOTAL, FREET4, T3FREE, THYROIDAB in the last 72 hours. Anemia Panel: No results for input(s): VITAMINB12, FOLATE, FERRITIN, TIBC, IRON, RETICCTPCT in the last 72 hours. Urine analysis:    Component Value Date/Time   COLORURINE YELLOW 02/19/2017 1431   APPEARANCEUR CLEAR 02/19/2017 1431   LABSPEC 1.020 02/19/2017 1431   PHURINE 5.5 02/19/2017 1431   GLUCOSEU NEGATIVE 02/19/2017 1431   GLUCOSEU NEGATIVE 08/30/2014 1519   HGBUR 1+ (A) 02/19/2017 1431   BILIRUBINUR NEGATIVE 02/19/2017 1431   KETONESUR NEGATIVE 02/19/2017 1431   PROTEINUR NEGATIVE 02/19/2017 1431   UROBILINOGEN 0.2 08/30/2014 1519   NITRITE POSITIVE (A) 02/19/2017 1431   LEUKOCYTESUR 1+ (A) 02/19/2017 1431      Recent Results (from the past 240 hour(s))  Surgical pcr screen     Status: Abnormal   Collection Time: 12/25/17  7:30 AM  Result Value Ref Range Status   MRSA, PCR NEGATIVE NEGATIVE Final   Staphylococcus aureus POSITIVE (A) NEGATIVE Final    Comment: (NOTE) The Xpert SA Assay (FDA approved for NASAL specimens in patients 75 years of age and older), is one component of a comprehensive surveillance program. It  is not intended to diagnose infection nor to guide or monitor treatment.       Anti-infectives (From admission, onward)   Start     Dose/Rate Route Frequency Ordered Stop   12/27/17 1300  cefTRIAXone (ROCEPHIN) 2 g in sodium chloride 0.9 % 100 mL IVPB     2 g 200 mL/hr over 30 Minutes Intravenous Every 24 hours 12/27/17 1045     12/27/17 1100  doxycycline (VIBRA-TABS) tablet 100 mg     100 mg Oral Every 12 hours 12/27/17 1037     12/27/17 1045  cefTRIAXone (ROCEPHIN) 1 g in sodium chloride 0.9 % 100 mL IVPB  Status:  Discontinued     1 g 200 mL/hr over 30 Minutes Intravenous Every 24 hours 12/27/17  1037 12/27/17 1045   12/24/17 2245  ceFAZolin (ANCEF) IVPB 2g/100 mL premix  Status:  Discontinued     2 g 200 mL/hr over 30 Minutes Intravenous Every 8 hours 12/24/17 2241 12/27/17 1037       Radiology Studies: Mr Knee Right Wo Contrast  Result Date: 12/25/2017 CLINICAL DATA:  83 year old female status post fall presents with pain and swelling EXAM: MRI OF THE RIGHT KNEE WITHOUT CONTRAST TECHNIQUE: Multiplanar, multisequence MR imaging of the knee was performed. No intravenous contrast was administered. COMPARISON:  None. FINDINGS: MENISCI Medial meniscus: Flap tear of the anterior horn involving the free edge and inferior articular surface, series 7, images 26 and 27 with secondary horizontal component. Free edge tear of the body of the medial meniscus, series 6/18. Inferior articular surfacing tear also noted of the posterior horn, series 7/27. Lateral meniscus: Free edge tear of the anterior horn and body of the lateral meniscus, series 6/16 and 17 and series 7/15. LIGAMENTS Cruciates:  Full-thickness tear of the mid ACL.  Intact PCL. Collaterals:  Intact CARTILAGE Patellofemoral: The grade 2 and grade 3 chondral thinning and fissuring along the lower pole of the patella overlying the median ridge and lateral patellar facet, series 3/33 intact trochlear cartilage. Medial: Mild  partial-thickness cartilage loss of the medial femorotibial compartment. Lateral:  No chondral defect Joint:  No joint effusion Popliteal Fossa: Trace fluid in the expected location of a popliteal cyst. Intact popliteus. Extensor Mechanism: Intact quadriceps and patellar tendons. Grade 2 sprain MPFL. Bones: No fracture, suspicious osseous lesion nor focal marrow abnormality. Other: Prepatellar hematoma measuring at least 8.9 x 2 x 7.7 cm. IMPRESSION: 1. Full-thickness tear of the ACL. 2. Intact quadriceps and patellar tendons. Grade 2 sprain of the medial patellofemoral ligament. 3. Flap tear of the anterior horn of the medial meniscus with inferior articular surfacing tear of the posterior horn and free edge tear of the body. 4. Free edge tear of the anterior horn and body of the lateral meniscus. 5. Mild chondrosis of the patellofemoral and medial femorotibial compartments. 6. Prepatellar hematoma approximately measuring 8.9 x 2 x 7.7 cm. Electronically Signed   By: Ashley Royalty M.D.   On: 12/25/2017 18:15        Scheduled Meds: . apixaban  2.5 mg Oral BID  . atorvastatin  80 mg Oral q1800  . clopidogrel  75 mg Oral Daily  . docusate sodium  100 mg Oral BID  . doxycycline  100 mg Oral Q12H  . DULoxetine  60 mg Oral Daily  . metoprolol succinate  75 mg Oral Daily  . pantoprazole  40 mg Oral Daily  . polyethylene glycol  17 g Oral BID   Continuous Infusions: . cefTRIAXone (ROCEPHIN)  IV 2 g (12/27/17 1258)  . methocarbamol (ROBAXIN)  IV       LOS: 2 days    Time spent: 35 min    Geradine Girt, DO [facility] Pager [phone]  If 7PM-7AM, please contact night-coverage [email or web address] Password [email or web address] 12/27/2017, 1:01 PM

## 2017-12-27 NOTE — Progress Notes (Signed)
Physical Therapy Treatment Patient Details Name: Hailey [name] MRN: [id number] DOB: 1935/02/09 Today's Date: 12/27/2017    History of Present Illness Pt is an 83 y/o female with recent fall. Suspected quad tendon rupture in R knee initially, however, MRI revealed ACL tear and meniscus tear. Pt admitted following syncopal episode at MDs office. PMH includes CAD s/p stent placement, HTN, a fib, and CKD.     PT Comments    Pt received up in chair with husband present ready to participate in PT.  Pt was Min guard for all transfers and gait.  She demonstrated good quad control during SLRx10 so knee immobilizer not used today.   Pt is still limited by fatigue. Recommend continuing POC so pt can regain activity tolerance.  Pt remains a good candidate for SNF as she needs Min guard for mobility/OOB.   Follow Up Recommendations  SNF;Supervision for mobility/OOB     Equipment Recommendations  None recommended by PT    Recommendations for Other Services       Precautions / Restrictions Precautions Precautions: Fall Required Braces or Orthoses: Knee Immobilizer - Right Knee Immobilizer - Right: Other (comment)(Showed good guad control during SLRx10 so no brace used today.) Restrictions Weight Bearing Restrictions: Yes RLE Weight Bearing: Weight bearing as tolerated Other Position/Activity Restrictions: Per Kirstin Shepperson, PA, ok to perform ROM in R knee and pt is to be WBAT.     Mobility  Bed Mobility Overal bed mobility: Needs Assistance                Transfers     Transfers: Sit to/from Stand Sit to Stand: Min guard         General transfer comment: VC for hand placement when sitting.    Ambulation/Gait Ambulation/Gait assistance: Min guard Ambulation Distance (Feet): 100 Feet Assistive device: Rolling walker (2 wheeled) Gait Pattern/deviations: Step-to pattern;Decreased step length - right;Decreased step length - left;Decreased weight shift to  right;Antalgic Gait velocity: Decreased    General Gait Details: Slow, antalgic gait secondary to pain in R knee.  Required min guard for safety and cues for proximity to device. Further distance limited by fatigue.   Stairs             Wheelchair Mobility    Modified Rankin (Stroke Patients Only)       Balance Overall balance assessment: Needs assistance Sitting-balance support: No upper extremity supported;Feet supported       Standing balance support: Bilateral upper extremity supported;During functional activity;No upper extremity supported   Standing balance comment: Able to static stand without UE support and minguard for safety; Reliant on BUE support for dynamic mobility                             Cognition Arousal/Alertness: Awake/alert Behavior During Therapy: WFL for tasks assessed/performed Overall Cognitive Status: Within Functional Limits for tasks assessed                                        Exercises Total Joint Exercises Ankle Circles/Pumps: AROM;20 reps;Both;Seated Quad Sets: AROM;10 reps;Right;Seated Short Arc Quad: AROM;10 reps;Right;Seated Hip ABduction/ADduction: AROM;10 reps;Right;Seated Straight Leg Raises: AROM;10 reps;Right;Seated    General Comments        Pertinent Vitals/Pain Pain Assessment: Faces Faces Pain Scale: Hurts a little bit Pain Location: R knee  Pain Descriptors / Indicators:  Grimacing;Guarding Pain Intervention(s): Limited activity within patient's tolerance;Monitored during session;Repositioned    Home Living                      Prior Function            PT Goals (current goals can now be found in the care plan section) Acute Rehab PT Goals Patient Stated Goal: to get better  PT Goal Formulation: With patient Time For Goal Achievement: 01/09/18 Potential to Achieve Goals: Good Progress towards PT goals: Progressing toward goals    Frequency    Min  3X/week      PT Plan      Co-evaluation              AM-PAC PT "6 Clicks" Daily Activity  Outcome Measure  Difficulty turning over in bed (including adjusting bedclothes, sheets and blankets)?: A Little     Help needed moving to and from a bed to chair (including a wheelchair)?: A Little Help needed walking in hospital room?: A Little   6 Click Score: 9    End of Session Equipment Utilized During Treatment: Gait belt Activity Tolerance: Patient limited by fatigue Patient left: in chair;with call bell/phone within reach;with family/visitor present Nurse Communication: Mobility status PT Visit Diagnosis: Other abnormalities of gait and mobility (R26.89);Unsteadiness on feet (R26.81);Muscle weakness (generalized) (M62.81)     Time: 3329-5188 PT Time Calculation (min) (ACUTE ONLY): 21 min  Charges:  $Gait Training: 8-22 mins                    G Codes:       Terri Skains, SPTA [phone]    Terri Skains 12/27/2017, 1:52 PM

## 2017-12-27 NOTE — Clinical Social Work Note (Signed)
Facility prepared to take pt today--pending d/c summary. MD notified.  [place], [place]

## 2017-12-27 NOTE — Progress Notes (Signed)
Subjective: Patient concerned because nurse came in to check on her early this am.  She states the nurse told her she was coming to check that the patient was still with [place] because her heart rate had slowed so much.  Objective: Vital signs in last 24 hours: Temp:  [97.8 F (36.6 C)-98.8 F (37.1 C)] 98.3 F (36.8 C) (04/19 0651) Pulse Rate:  [2-75] 2 (04/19 0900) Resp:  [16] 16 (04/19 0651) BP: (110-120)/(53-76) 120/53 (04/19 0651) SpO2:  [92 %-95 %] 95 % (04/19 0651)  Intake/Output from previous day: 04/18 0701 - 04/19 0700 In: 820 [P.O.:720; IV Piggyback:100] Out: -  Intake/Output this shift: Total I/O In: 240 [P.O.:240] Out: -   Recent Labs    12/24/17 1207 12/25/17 0900 12/26/17 0611 12/27/17 0940  HGB 9.1* 7.9* 10.4* 10.5*   Recent Labs    12/26/17 0611 12/27/17 0940  WBC 10.8* 11.3*  RBC 3.55* 3.54*  HCT 31.7* 31.8*  PLT 215 245   Recent Labs    12/25/17 0900 12/26/17 0611  NA 136 138  K 3.8 3.7  CL 108 109  CO2 20* 21*  BUN 17 18  CREATININE 1.06* 1.15*  GLUCOSE 104* 120*  CALCIUM 8.1* 8.2*   No results for input(s): LABPT, INR in the last 72 hours.  dressing changed again today more redness around superior wound with large hematoma blister.  much more serous drainage from her lower leg wound as well.  DNVI     Assessment Principal Problem:   Cellulitis of leg, right with large prepatella hematoma and open wounds Active Problems:   Essential hypertension   Hypotension   PAF (paroxysmal atrial fibrillation) (HCC)   Coronary artery disease involving native coronary artery of native heart with angina pectoris (HCC)   Chronic anticoagulation   Quadriceps tendon rupture   CKD (chronic kidney disease), stage II   Injury of quadriceps tendon   Acute blood loss anemia due to right leg injury   Bradycardia with 41-50 beats per minute   Right bundle branch block (RBBB) on electrocardiogram (ECG)    Plan: Since her WBC with a bacterial  shift is rising despite 2 grams of Ancef IV q 8 hrs and episode of bradycardia this am, I am cancelling her discharge plan for today.  I have switched her from Ancef to Rocephin and added po Doxycycline 100mg  BID.  Will continue to monitor on Telemetry and will check CBC with Diff in the am.   Linda Hedges 12/27/2017, 10:38 AM

## 2017-12-28 DIAGNOSIS — Z23 Encounter for immunization: Secondary | ICD-10-CM | POA: Diagnosis not present

## 2017-12-28 LAB — CBC WITH DIFFERENTIAL/PLATELET
Basophils Absolute: 0 10*3/uL (ref 0.0–0.1)
Basophils Relative: 0 %
Eosinophils Absolute: 0.4 10*3/uL (ref 0.0–0.7)
Eosinophils Relative: 5 %
HCT: 30.7 % — ABNORMAL LOW (ref 36.0–46.0)
Hemoglobin: 10 g/dL — ABNORMAL LOW (ref 12.0–15.0)
Lymphocytes Relative: 12 %
Lymphs Abs: 1.1 10*3/uL (ref 0.7–4.0)
MCH: 29.4 pg (ref 26.0–34.0)
MCHC: 32.6 g/dL (ref 30.0–36.0)
MCV: 90.3 fL (ref 78.0–100.0)
Monocytes Absolute: 1.3 10*3/uL — ABNORMAL HIGH (ref 0.1–1.0)
Monocytes Relative: 15 %
Neutro Abs: 5.9 10*3/uL (ref 1.7–7.7)
Neutrophils Relative %: 68 %
Platelets: 249 10*3/uL (ref 150–400)
RBC: 3.4 MIL/uL — ABNORMAL LOW (ref 3.87–5.11)
RDW: 16.6 % — ABNORMAL HIGH (ref 11.5–15.5)
WBC: 8.7 10*3/uL (ref 4.0–10.5)

## 2017-12-28 LAB — BASIC METABOLIC PANEL
Anion gap: 7 (ref 5–15)
BUN: 14 mg/dL (ref 6–20)
CO2: 22 mmol/L (ref 22–32)
Calcium: 8.5 mg/dL — ABNORMAL LOW (ref 8.9–10.3)
Chloride: 107 mmol/L (ref 101–111)
Creatinine, Ser: 0.98 mg/dL (ref 0.44–1.00)
GFR calc Af Amer: >60 mL/min (ref >60)
GFR calc non Af Amer: 53 mL/min — ABNORMAL LOW (ref >60)
Glucose, Bld: 98 mg/dL (ref 65–99)
Potassium: 3.8 mmol/L (ref 3.5–5.1)
Sodium: 136 mmol/L (ref 135–145)

## 2017-12-28 MED ORDER — DOXYCYCLINE HYCLATE 100 MG PO TABS: 100.0000 mg | ORAL_TABLET | Freq: Two times a day (BID) | ORAL | 0 refills | Status: DC

## 2017-12-28 MED ORDER — DOCUSATE SODIUM 100 MG PO CAPS: ORAL_CAPSULE | ORAL | 0 refills | Status: DC

## 2017-12-28 MED ORDER — CALCIUM CARBONATE ANTACID 500 MG PO CHEW: 1.0000 | CHEWABLE_TABLET | Freq: Two times a day (BID) | ORAL | 0 refills | Status: DC | PRN

## 2017-12-28 MED ORDER — ACETAMINOPHEN 160 MG/5ML PO SOLN: 640.0000 mg | Freq: Four times a day (QID) | ORAL | 0 refills | Status: DC | PRN

## 2017-12-28 MED ORDER — POLYETHYLENE GLYCOL 3350 17 G PO PACK: PACK | ORAL | 0 refills | Status: DC

## 2017-12-28 MED ORDER — CEFUROXIME AXETIL 500 MG PO TABS: 500.0000 mg | ORAL_TABLET | Freq: Two times a day (BID) | ORAL | 0 refills | Status: AC

## 2017-12-28 MED ORDER — APIXABAN 2.5 MG PO TABS: 2.5000 mg | ORAL_TABLET | Freq: Two times a day (BID) | ORAL | 0 refills | Status: DC

## 2017-12-28 NOTE — Clinical Social Work Note (Signed)
Clinical Social Worker facilitated patient discharge including contacting patient family and facility to confirm patient discharge plans.  Clinical information faxed to facility and family agreeable with plan.  CSW arranged ambulance transport via [facility] to [place] .  RN to call [phone] for report prior to discharge.  Clinical Social Worker will sign off for now as social work intervention is no longer needed. Please consult [place] again if new need arises.  [place], [place]

## 2017-12-28 NOTE — Progress Notes (Signed)
Hailey [name] to be D/C'd Skilled nursing facility per MD order.  Discussed prescriptions and follow up appointments with the patient. Prescriptions given to patient, medication list explained in detail. Pt verbalized understanding.  Allergies as of 12/28/2017      Reactions   Macrobid [nitrofurantoin Monohyd Macro] Other (See Comments)   Nausea, stomach cramps, fatigue , headache.   Meloxicam Other (See Comments)   Jittery and headache   Digoxin And Related    headaches   Sulfamethoxazole-trimethoprim Nausea Only      Medication List    STOP taking these medications   oxyCODONE-acetaminophen 5-325 MG tablet Commonly known as:  PERCOCET/ROXICET     TAKE these medications   acetaminophen 160 MG/5ML solution Commonly known as:  TYLENOL Take 20 mLs (640 mg total) by mouth every 6 (six) hours as needed for mild pain or headache.   apixaban 2.5 MG Tabs tablet Commonly known as:  ELIQUIS Take 1 tablet (2.5 mg total) by mouth 2 (two) times daily. Lower dose because of large hematoma on right knee What changed:    medication strength  how much to take  additional instructions   atorvastatin 80 MG tablet Commonly known as:  LIPITOR TAKE 1 TABLET AT 6PM.   calcium carbonate 500 MG chewable tablet Commonly known as:  TUMS - dosed in mg elemental calcium Chew 1 tablet (200 mg of elemental calcium total) by mouth 3 times/day as needed-between meals & bedtime for indigestion or heartburn.   cefUROXime 500 MG tablet Commonly known as:  CEFTIN Take 1 tablet (500 mg total) by mouth 2 (two) times daily for 10 days.   cetirizine 10 MG tablet Commonly known as:  ZYRTEC Take 10 mg by mouth daily as needed for allergies or rhinitis.   clobetasol cream 0.05 % Commonly known as:  TEMOVATE APPLY NIGHTLY AS DIRECTED. What changed:    how much to take  how to take this  when to take this  reasons to take this  additional instructions   clopidogrel 75 MG tablet Commonly known  as:  PLAVIX Take 1 tablet (75 mg total) by mouth daily.   diclofenac sodium 1 % Gel Commonly known as:  VOLTAREN APPLY 2 GRAMS TOPICALLY 4 TIMES DAILY.   diphenoxylate-atropine 2.5-0.025 MG tablet Commonly known as:  LOMOTIL Take 1 tablet by mouth 4 (four) times daily as needed for diarrhea or loose stools.   docusate sodium 100 MG capsule Commonly known as:  COLACE 1 tab 2 times a day while on narcotics.  STOOL SOFTENER   doxycycline 100 MG tablet Commonly known as:  VIBRA-TABS Take 1 tablet (100 mg total) by mouth every 12 (twelve) hours.   DULoxetine 60 MG capsule Commonly known as:  CYMBALTA TAKE (1) CAPSULE DAILY.   ketotifen 0.025 % ophthalmic solution Commonly known as:  ZADITOR Place 1 drop into both eyes daily as needed (ITCHING).   LINZESS 290 MCG Caps capsule Generic drug:  linaclotide Take 290 mcg by mouth daily as needed (constipation).   LORazepam 1 MG tablet Commonly known as:  ATIVAN Take 1 mg by mouth 2 (two) times daily as needed for anxiety or sleep.   metoprolol succinate 25 MG 24 hr tablet Commonly known as:  TOPROL-XL Take 3 tablets (75 mg total) by mouth daily. Take with or immediately following a meal.   nitroGLYCERIN 0.4 MG SL tablet Commonly known as:  NITROSTAT Place 0.4 mg under the tongue every 5 (five) minutes as needed for chest pain (Call  911 at 3rd dose within 15 minutes.).   pantoprazole 40 MG tablet Commonly known as:  PROTONIX TAKE 1 TABLET BY MOUTH DAILY.   polyethylene glycol packet Commonly known as:  MIRALAX / GLYCOLAX 17grams in 6 oz of water twice a day until bowel movement.  LAXITIVE.  Restart if two days since last bowel movement   sodium chloride 0.65 % Soln nasal spray Commonly known as:  OCEAN Place 1 spray into both nostrils 2 (two) times daily as needed for congestion.            Discharge Care Instructions  (From admission, onward)        Start     Ordered   12/28/17 0000  Change dressing    Comments:   Change the gauze dressing daily with sterile 4 x 4 inch gauze and apply TED hose.  DO NOT REMOVE Mepetel mesh covering each wound   12/28/17 0912      Vitals:   12/27/17 1941 12/28/17 0459  BP: 112/69 137/76  Pulse: 71 98  Resp:    Temp: 98.1 F (36.7 C) 98.2 F (36.8 C)  SpO2: 97% 97%    Skin clean, dry and intact without evidence of skin break down, no evidence of skin tears noted. Right knee area is covered with ADB pads, and TED hose are in place upon discharge. IV catheter discontinued intact. Site without signs and symptoms of complications. Dressing and pressure applied. Pt denies pain at this time. No complaints noted.  An After Visit Summary and prescriptions were printed and given to [facility]. Patient escorted via stretcher, and D/C to SNF via [facility].  [place] RN

## 2017-12-28 NOTE — NC FL2 (Signed)
[place] LEVEL OF CARE SCREENING TOOL     IDENTIFICATION  Patient Name: Hailey [name] Birthdate: [birth year] Sex: female Admission Date (Current Location): 12/24/2017  [facility] and [place] Number:  Herbalist and Address:  [facility], [place] [address] [place],  [address]      Provider Number: [id number]  Attending Physician Name and Address:  Robbie Lis, MD  Relative Name and Phone Number:  Delma Freeze, spouse, [phone]    Current Level of Care: Hospital Recommended Level of Care: [place] Prior Approval Number:    Date Approved/Denied:   PASRR Number: [id number]  Discharge Plan: SNF    Current Diagnoses: Patient Active Problem List   Diagnosis Date Noted  . Bradycardia with 41-50 beats per minute 12/27/2017  . Right bundle branch block (RBBB) on electrocardiogram (ECG) 12/27/2017  . Acute blood loss anemia due to right leg injury 12/25/2017  . Quadriceps tendon rupture 12/24/2017  . CKD (chronic kidney disease), stage II 12/24/2017  . Injury of quadriceps tendon 12/24/2017  . Cellulitis of leg, right with large prepatella hematoma and open wounds 12/24/2017  . Iliotibial band syndrome of right side 12/16/2017  . Depression 07/11/2017  . Intervertebral lumbar disc disorder with myelopathy, lumbar region 04/25/2017  . Tendinitis, de Quervain's 11/07/2016  . Gastroenteritis 09/13/2016  . Stress at home 09/13/2016  . Chronic anticoagulation 05/24/2015  . Medication intolerance 02/18/2015  . Coronary artery disease involving native coronary artery of native heart with angina pectoris ([place]) 12/20/2014  . Iliotibial band syndrome of left side 11/16/2014  . Diverticulitis of colon 08/30/2014  . URI, acute 09/07/2013  . PAF (paroxysmal atrial fibrillation) ([place]) 11/21/2012  . Greater trochanteric bursitis of both hips 05/21/2012  . Strain of abdominal muscle 02/13/2012  . 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 Acute upper respiratory infection 09/20/2010  . Osteoarthritis of left hip 07/03/2010  . [place] DISEASE, LUMBOSACRAL SPINE 05/18/2010  . TOBACCO USE, QUIT 12/01/2009  . SYNCOPE 10/27/2008  . Disorder resulting from impaired renal function 08/25/2008  . Hypotension 05/26/2008  . Diverticulosis of large intestine 05/21/2008  . IBS 05/21/2008  . Essential hypertension 06/16/2007  . GERD 06/16/2007  . COLONIC POLYPS, HX OF 06/16/2007    Orientation RESPIRATION BLADDER Height & Weight     Self, Time, Situation, Place  Normal Continent Weight: 167 lb (75.8 kg) Height:  5\' 4"  (162.6 cm)  BEHAVIORAL SYMPTOMS/MOOD NEUROLOGICAL BOWEL NUTRITION STATUS      Continent Diet(See DC Summary)  AMBULATORY STATUS COMMUNICATION OF NEEDS Skin   Limited Assist Verbally Bruising                       Personal Care Assistance Level of Assistance  Bathing, Feeding, Dressing Bathing Assistance: Limited assistance Feeding assistance: Independent Dressing Assistance: Limited assistance     Functional Limitations Info  Sight, Hearing, Speech Sight Info: Adequate Hearing Info: Adequate Speech Info: Adequate    SPECIAL CARE FACTORS FREQUENCY  PT (By licensed PT), OT (By licensed OT)     PT Frequency: 5x week OT Frequency: 5x week            Contractures      Additional Factors Info  Code Status, Allergies, Psychotropic Code Status Info: Full  Allergies Info: MACROBID NITROFURANTOIN MONOHYD MACRO, MELOXICAM, DIGOXIN AND RELATED, SULFAMETHOXAZOLE-TRIMETHOPRIM  Psychotropic Info: Cymbalta         Current Medications (12/28/2017):  This is  the current hospital active medication list Current Facility-Administered Medications  Medication Dose Route Frequency Provider Last Rate Last Dose  . acetaminophen (TYLENOL) solution 1,000 mg  1,000 mg Oral Q6H PRN Bodenheimer, Charles A, NP   1,000 mg at 12/28/17 0008  . apixaban (ELIQUIS) tablet 2.5 mg   2.5 mg Oral BID Shepperson, Kirstin, PA-C   2.5 mg at 12/28/17 0845  . atorvastatin (LIPITOR) tablet 80 mg  80 mg Oral q1800 Karmen Bongo, MD   80 mg at 12/26/17 1717  . calcium carbonate (TUMS - dosed in mg elemental calcium) chewable tablet 200 mg of elemental calcium  1 tablet Oral BID BM & HS PRN Eliseo Squires, Jessica U, DO   200 mg of elemental calcium at 12/27/17 1620  . cefTRIAXone (ROCEPHIN) 2 g in sodium chloride 0.9 % 100 mL IVPB  2 g Intravenous Q24H Shepperson, Kirstin, PA-C   Stopped at 12/27/17 1328  . clopidogrel (PLAVIX) tablet 75 mg  75 mg Oral Daily Karmen Bongo, MD   75 mg at 12/28/17 0845  . docusate sodium (COLACE) capsule 100 mg  100 mg Oral BID Karmen Bongo, MD   100 mg at 12/28/17 0845  . doxycycline (VIBRA-TABS) tablet 100 mg  100 mg Oral Q12H Shepperson, Kirstin, PA-C   100 mg at 12/28/17 0845  . DULoxetine (CYMBALTA) DR capsule 60 mg  60 mg Oral Daily Karmen Bongo, MD   60 mg at 12/28/17 0845  . famotidine (PEPCID) tablet 20 mg  20 mg Oral Daily Eulogio Bear U, DO   20 mg at 12/28/17 0845  . LORazepam (ATIVAN) tablet 1 mg  1 mg Oral BID PRN Karmen Bongo, MD   1 mg at 12/27/17 2212  . methocarbamol (ROBAXIN) tablet 500 mg  500 mg Oral Q6H PRN Karmen Bongo, MD   500 mg at 12/28/17 0845   Or  . methocarbamol (ROBAXIN) 500 mg in dextrose 5 % 50 mL IVPB  500 mg Intravenous Q6H PRN Karmen Bongo, MD      . metoprolol succinate (TOPROL-XL) 24 hr tablet 75 mg  75 mg Oral Daily Karmen Bongo, MD   75 mg at 12/28/17 0845  . oxyCODONE (Oxy IR/ROXICODONE) immediate release tablet 5 mg  5 mg Oral Q4H PRN Vann, Jessica U, DO      . pantoprazole (PROTONIX) EC tablet 40 mg  40 mg Oral Daily Karmen Bongo, MD   40 mg at 12/28/17 0846  . polyethylene glycol (MIRALAX / GLYCOLAX) packet 17 g  17 g Oral BID Shepperson, Kirstin, PA-C   17 g at 12/28/17 2595     Discharge Medications: Please see discharge summary for a list of discharge medications.  Relevant Imaging  Results:  Relevant Lab Results:   Additional Information ss#:[id number] [place] Sanav Remer, LCSW

## 2017-12-28 NOTE — Clinical Social Work Placement (Signed)
   CLINICAL SOCIAL WORK PLACEMENT  NOTE  Date:  12/28/2017  Patient Details  Name: Hailey [name] MRN: [id number] Date of Birth: 1935-04-25  Clinical Social Work is seeking post-discharge placement for this patient at the [place] level of care (*CSW will initial, date and re-position this form in  chart as items are completed):  Yes   Patient/family provided with [facility] Work Department's list of facilities offering this level of care within the geographic area requested by the patient (or if unable, by the patient's family).  Yes   Patient/family informed of their freedom to choose among providers that offer the needed level of care, that participate in Medicare, Medicaid or managed care program needed by the patient, have an available bed and are willing to accept the patient.  Yes   Patient/family informed of [facility]'s ownership interest in [facility] and [facility], as well as of the fact that they are under no obligation to receive care at these facilities.  PASRR submitted to EDS on       PASRR number received on 12/26/17     Existing PASRR number confirmed on       FL2 transmitted to all facilities in geographic area requested by pt/family on 12/27/17     FL2 transmitted to all facilities within larger geographic area on       Patient informed that his/her managed care company has contracts with or will negotiate with certain facilities, including the following:        Yes   Patient/family informed of bed offers received.  Patient chooses bed at [facility]     Physician recommends and patient chooses bed at      Patient to be transferred to [facility] on 12/28/17.  Patient to be transferred to facility by [facility]     Patient family notified on 12/28/17 of transfer.  Name of family member notified:  pt responsible for self     PHYSICIAN       Additional Comment:     _______________________________________________ Eileen Stanford, LCSW 12/28/2017, 10:51 AM

## 2017-12-28 NOTE — Discharge Summary (Signed)
Patient ID: ADALAY AZUCENA MRN: [id number] DOB/AGE: [birth year] 81 y.o.  Admit date: 12/24/2017 Discharge date: 12/28/2017  Admission Diagnoses:  Principal Problem:   Cellulitis of leg, right with large prepatella hematoma and open wounds Active Problems:   Essential hypertension   Hypotension   PAF (paroxysmal atrial fibrillation) (HCC)   Coronary artery disease involving native coronary artery of native heart with angina pectoris (HCC)   Chronic anticoagulation   Quadriceps tendon rupture   CKD (chronic kidney disease), stage II   Injury of quadriceps tendon   Acute blood loss anemia due to right leg injury   Bradycardia with 41-50 beats per minute   Right bundle branch block (RBBB) on electrocardiogram (ECG)   Discharge Diagnoses:  Same  Past Medical History:  Diagnosis Date  . Acute blood loss anemia 12/25/2017  . Allergic rhinitis   . Anxiety   . Arthritis    "my whole spine" (07/01/2017)  . Atrial fibrillation ([place])   . Bradycardia with 41-50 beats per minute 12/27/2017  . Cellulitis of leg, right with large prepatella hematoma and open wounds 12/26/2017  . CKD (chronic kidney disease) stage 2, GFR 60-89 ml/min   . Coronary artery disease    10/18 PCI/DES to p/m LCx with cutting balloon to mLcx  . Diverticulosis of colon   . GERD (gastroesophageal reflux disease)   . Hip bursitis 2010   Dr Para March, Post op seroma  . History of colon polyps   . HTN (hypertension)   . IBS (irritable bowel syndrome)    constipation predominant - Dr Earlean Shawl  . Lichen sclerosus   . Osteopenia 11/2016   T score -2.0 FRAX 15%/4.3%  . PAC (premature atrial contraction)    Symptomatiic  . Right bundle branch block (RBBB) on electrocardiogram (ECG) 12/27/2017  . Scoliosis   . SVT (supraventricular tachycardia) ([place])    brief history    Surgeries: Procedure(s): None   Consultants: Treatment Team:  Marissa Nestle, MD  Discharged Condition: Improved  Hospital  Course: KATHRYNE RAMELLA is an 83 y.o. female who was admitted 12/24/2017 initially for hypotensive syncopal episode following a right knee injury that was initially assessed on CT as a quad tendon rupture that would need surgical repair.  Once she was medially stable the knee was examined further and MRI was ordered.  MRI showed no quad tendon rupture with large hematoma under open wounds with early cellulitis.  IV Ancef was started with daily dressing changes.  Despite multiple days of IV ancef, her WBC still continued to rise and drainage from knee wounds progressively got worse.  Yesterday she was changed from Ancef to Rocephin and Doxycycline.  Today drainage is decreased from both knee wounds.  We are waiting on today's lab results to clear for discharge to [facility] on PO Ceftin and Doxycycline.    Patient was given perioperative antibiotics:  Anti-infectives (From admission, onward)   Start     Dose/Rate Route Frequency Ordered Stop   12/28/17 0000  doxycycline (VIBRA-TABS) 100 MG tablet     100 mg Oral Every 12 hours 12/28/17 0912     12/28/17 0000  cefUROXime (CEFTIN) 500 MG tablet     500 mg Oral 2 times daily 12/28/17 0912 01/07/18 2359   12/27/17 1300  cefTRIAXone (ROCEPHIN) 2 g in sodium chloride 0.9 % 100 mL IVPB     2 g 200 mL/hr over 30 Minutes Intravenous Every 24 hours 12/27/17 1045     12/27/17  1100  doxycycline (VIBRA-TABS) tablet 100 mg     100 mg Oral Every 12 hours 12/27/17 1037     12/27/17 1045  cefTRIAXone (ROCEPHIN) 1 g in sodium chloride 0.9 % 100 mL IVPB  Status:  Discontinued     1 g 200 mL/hr over 30 Minutes Intravenous Every 24 hours 12/27/17 1037 12/27/17 1045   12/24/17 2245  ceFAZolin (ANCEF) IVPB 2g/100 mL premix  Status:  Discontinued     2 g 200 mL/hr over 30 Minutes Intravenous Every 8 hours 12/24/17 2241 12/27/17 1037       Patient was given sequential compression devices, early ambulation, and chemoprophylaxis to prevent DVT.  Patient benefited  maximally from hospital stay and there were no complications.    Recent vital signs:  Patient Vitals for the past 24 hrs:  BP Temp Temp src Pulse Resp SpO2  12/28/17 0459 137/76 98.2 F (36.8 C) Oral 98 - 97 %  12/27/17 1941 112/69 98.1 F (36.7 C) Oral 71 - 97 %  12/27/17 1401 101/66 98.9 F (37.2 C) Oral 66 18 96 %     Recent laboratory studies:  Recent Labs    12/26/17 0611 12/27/17 0940 12/28/17 0705  WBC 10.8* 11.3* 8.7  HGB 10.4* 10.5* 10.0*  HCT 31.7* 31.8* 30.7*  PLT 215 245 249  NA 138  --   --   K 3.7  --   --   CL 109  --   --   CO2 21*  --   --   BUN 18  --   --   CREATININE 1.15*  --   --   GLUCOSE 120*  --   --   CALCIUM 8.2*  --   --      Discharge Medications:   Allergies as of 12/28/2017      Reactions   Macrobid [nitrofurantoin Monohyd Macro] Other (See Comments)   Nausea, stomach cramps, fatigue , headache.   Meloxicam Other (See Comments)   Jittery and headache   Digoxin And Related    headaches   Sulfamethoxazole-trimethoprim Nausea Only      Medication List    STOP taking these medications   oxyCODONE-acetaminophen 5-325 MG tablet Commonly known as:  PERCOCET/ROXICET     TAKE these medications   acetaminophen 160 MG/5ML solution Commonly known as:  TYLENOL Take 20 mLs (640 mg total) by mouth every 6 (six) hours as needed for mild pain or headache.   apixaban 2.5 MG Tabs tablet Commonly known as:  ELIQUIS Take 1 tablet (2.5 mg total) by mouth 2 (two) times daily. Lower dose because of large hematoma on right knee What changed:    medication strength  how much to take  additional instructions   atorvastatin 80 MG tablet Commonly known as:  LIPITOR TAKE 1 TABLET AT 6PM.   calcium carbonate 500 MG chewable tablet Commonly known as:  TUMS - dosed in mg elemental calcium Chew 1 tablet (200 mg of elemental calcium total) by mouth 3 times/day as needed-between meals & bedtime for indigestion or heartburn.   cefUROXime 500 MG  tablet Commonly known as:  CEFTIN Take 1 tablet (500 mg total) by mouth 2 (two) times daily for 10 days.   cetirizine 10 MG tablet Commonly known as:  ZYRTEC Take 10 mg by mouth daily as needed for allergies or rhinitis.   clobetasol cream 0.05 % Commonly known as:  TEMOVATE APPLY NIGHTLY AS DIRECTED. What changed:    how much to take  how to take this  when to take this  reasons to take this  additional instructions   clopidogrel 75 MG tablet Commonly known as:  PLAVIX Take 1 tablet (75 mg total) by mouth daily.   diclofenac sodium 1 % Gel Commonly known as:  VOLTAREN APPLY 2 GRAMS TOPICALLY 4 TIMES DAILY.   diphenoxylate-atropine 2.5-0.025 MG tablet Commonly known as:  LOMOTIL Take 1 tablet by mouth 4 (four) times daily as needed for diarrhea or loose stools.   docusate sodium 100 MG capsule Commonly known as:  COLACE 1 tab 2 times a day while on narcotics.  STOOL SOFTENER   doxycycline 100 MG tablet Commonly known as:  VIBRA-TABS Take 1 tablet (100 mg total) by mouth every 12 (twelve) hours.   DULoxetine 60 MG capsule Commonly known as:  CYMBALTA TAKE (1) CAPSULE DAILY.   ketotifen 0.025 % ophthalmic solution Commonly known as:  ZADITOR Place 1 drop into both eyes daily as needed (ITCHING).   LINZESS 290 MCG Caps capsule Generic drug:  linaclotide Take 290 mcg by mouth daily as needed (constipation).   LORazepam 1 MG tablet Commonly known as:  ATIVAN Take 1 mg by mouth 2 (two) times daily as needed for anxiety or sleep.   metoprolol succinate 25 MG 24 hr tablet Commonly known as:  TOPROL-XL Take 3 tablets (75 mg total) by mouth daily. Take with or immediately following a meal.   nitroGLYCERIN 0.4 MG SL tablet Commonly known as:  NITROSTAT Place 0.4 mg under the tongue every 5 (five) minutes as needed for chest pain (Call 911 at 3rd dose within 15 minutes.).   pantoprazole 40 MG tablet Commonly known as:  PROTONIX TAKE 1 TABLET BY MOUTH DAILY.    polyethylene glycol packet Commonly known as:  MIRALAX / GLYCOLAX 17grams in 6 oz of water twice a day until bowel movement.  LAXITIVE.  Restart if two days since last bowel movement   sodium chloride 0.65 % Soln nasal spray Commonly known as:  OCEAN Place 1 spray into both nostrils 2 (two) times daily as needed for congestion.            Discharge Care Instructions  (From admission, onward)        Start     Ordered   12/28/17 0000  Change dressing    Comments:  Change the gauze dressing daily with sterile 4 x 4 inch gauze and apply TED hose.  DO NOT REMOVE Mepetel mesh covering each wound   12/28/17 0912      Diagnostic Studies: Dg Chest 2 View  Result Date: 12/24/2017 CLINICAL DATA:  Syncope.  Preop for ruptured quadriceps. EXAM: CHEST - 2 VIEW COMPARISON:  07/03/2014 FINDINGS: Stable borderline heart size and mild aortic tortuosity. There is no edema, consolidation, effusion, or pneumothorax. Spondylosis. IMPRESSION: No evidence of active disease. Electronically Signed   By: Monte Fantasia M.D.   On: 12/24/2017 13:02   Dg Tibia/fibula Right  Result Date: 12/25/2017 CLINICAL DATA:  Right leg pain after fall 3 days ago. EXAM: RIGHT TIBIA AND FIBULA - 2 VIEW COMPARISON:  None. FINDINGS: There is no evidence of fracture or other focal bone lesions. Soft tissues are unremarkable. IMPRESSION: Normal right tibia and fibula. Electronically Signed   By: Marijo Conception, M.D.   On: 12/25/2017 08:47   Ct Knee Right Wo Contrast  Result Date: 12/23/2017 CLINICAL DATA:  Status post fall, with injury to the right knee. Initial encounter. EXAM: CT OF THE RIGHT KNEE  WITHOUT CONTRAST TECHNIQUE: Multidetector CT imaging of the right knee was performed according to the standard protocol. Multiplanar CT image reconstructions were also generated. COMPARISON:  Right knee radiographs performed earlier today at 7:42 p.m. FINDINGS: Bones/Joint/Cartilage There is no evidence of fracture or dislocation.  Visualized joint spaces are grossly preserved. No knee joint effusion is seen. The cartilage is not well assessed on CT. Ligaments The anterior and posterior cruciate ligaments appear grossly intact. The medial collateral ligament and lateral collateral ligament complex are grossly unremarkable. Muscles and Tendons There appears to be partial disruption of the distal aspect of the quadriceps, with a large 8.9 x 4.6 x 7.2 cm hematoma anterior to the patella, with fluid-fluid levels. Remaining musculature is grossly unremarkable in appearance. Soft tissues Soft tissue injury is noted tracking about the knee. Scattered vascular calcifications are seen. IMPRESSION: 1. No evidence of fracture or dislocation. 2. Partial disruption of the distal aspect of the quadriceps muscle, with a large 8.9 cm hematoma anterior to the patella, with fluid-fluid levels. Surrounding soft tissue injury noted tracking about the knee. 3. Scattered vascular calcifications. Electronically Signed   By: Garald Balding M.D.   On: 12/23/2017 00:47   Mr Knee Right Wo Contrast  Result Date: 12/25/2017 CLINICAL DATA:  83 year old female status post fall presents with pain and swelling EXAM: MRI OF THE RIGHT KNEE WITHOUT CONTRAST TECHNIQUE: Multiplanar, multisequence MR imaging of the knee was performed. No intravenous contrast was administered. COMPARISON:  None. FINDINGS: MENISCI Medial meniscus: Flap tear of the anterior horn involving the free edge and inferior articular surface, series 7, images 26 and 27 with secondary horizontal component. Free edge tear of the body of the medial meniscus, series 6/18. Inferior articular surfacing tear also noted of the posterior horn, series 7/27. Lateral meniscus: Free edge tear of the anterior horn and body of the lateral meniscus, series 6/16 and 17 and series 7/15. LIGAMENTS Cruciates:  Full-thickness tear of the mid ACL.  Intact PCL. Collaterals:  Intact CARTILAGE Patellofemoral: The grade 2 and grade  3 chondral thinning and fissuring along the lower pole of the patella overlying the median ridge and lateral patellar facet, series 3/33 intact trochlear cartilage. Medial: Mild partial-thickness cartilage loss of the medial femorotibial compartment. Lateral:  No chondral defect Joint:  No joint effusion Popliteal Fossa: Trace fluid in the expected location of a popliteal cyst. Intact popliteus. Extensor Mechanism: Intact quadriceps and patellar tendons. Grade 2 sprain MPFL. Bones: No fracture, suspicious osseous lesion nor focal marrow abnormality. Other: Prepatellar hematoma measuring at least 8.9 x 2 x 7.7 cm. IMPRESSION: 1. Full-thickness tear of the ACL. 2. Intact quadriceps and patellar tendons. Grade 2 sprain of the medial patellofemoral ligament. 3. Flap tear of the anterior horn of the medial meniscus with inferior articular surfacing tear of the posterior horn and free edge tear of the body. 4. Free edge tear of the anterior horn and body of the lateral meniscus. 5. Mild chondrosis of the patellofemoral and medial femorotibial compartments. 6. Prepatellar hematoma approximately measuring 8.9 x 2 x 7.7 cm. Electronically Signed   By: Ashley Royalty M.D.   On: 12/25/2017 18:15   Dg Knee Complete 4 Views Right  Result Date: 12/22/2017 CLINICAL DATA:  Fall with bleeding and swelling EXAM: RIGHT KNEE - COMPLETE 4+ VIEW COMPARISON:  11/27/2013 FINDINGS: No acute displaced fracture or malalignment is seen. Joint space calcifications. No large knee fusion. Large amount of prepatellar soft tissue swelling. Mild degenerative changes of the medial joint space.  IMPRESSION: Large amount of prepatellar soft tissue swelling. No acute osseous abnormality. Chondrocalcinosis. Electronically Signed   By: Donavan Foil M.D.   On: 12/22/2017 19:59   Dg Hip Unilat With Pelvis 2-3 Views Right  Result Date: 12/25/2017 CLINICAL DATA:  Right leg pain after fall 3 days ago. EXAM: DG HIP (WITH OR WITHOUT PELVIS) 2-3V RIGHT  COMPARISON:  None. FINDINGS: There is no evidence of hip fracture or dislocation. There is no evidence of arthropathy or other focal bone abnormality. IMPRESSION: Normal right hip. Electronically Signed   By: Marijo Conception, M.D.   On: 12/25/2017 08:49    Disposition: Discharge disposition: 03-Skilled Nursing Facility       Discharge Instructions    Call MD / Call 911   Complete by:  As directed    If you experience chest pain or shortness of breath, CALL 911 and be transported to the hospital emergency room.  If you develope a fever above 101 F, pus (white drainage) or increased drainage or redness at the wound, or calf pain, call your surgeon's office.   Change dressing   Complete by:  As directed    Change the gauze dressing daily with sterile 4 x 4 inch gauze and apply TED hose.  DO NOT REMOVE Mepetel mesh covering each wound   Constipation Prevention   Complete by:  As directed    Drink plenty of fluids.  Prune juice may be helpful.  You may use a stool softener, such as Colace (over the counter) 100 mg twice a day.  Use MiraLax (over the counter) for constipation as needed.   Diet - low sodium heart healthy   Complete by:  As directed    Discharge instructions   Complete by:  As directed    INSTRUCTIONS AFTER JOINT REPLACEMENT   Remove items at home which could result in a fall. This includes throw rugs or furniture in walking pathways You may notice swelling that will progress down to the foot and ankle.  This is normal after surgery.  Elevate your leg when you are not up walking on it.   Continue to use the breathing machine you got in the hospital (incentive spirometer) which will help keep your temperature down.  It is common for your temperature to cycle up and down following surgery, especially at night when you are not up moving around and exerting yourself.  The breathing machine keeps your lungs expanded and your temperature down.   DIET:  As you were doing prior to  hospitalization, we recommend a well-balanced diet.  DRESSING / WOUND CARE / SHOWERING  Keep the dressing until follow up.    IF THE DRESSING FALLS OFF or the wound gets wet inside, change the dressing with sterile gauze.  Please use good hand washing techniques before changing the dressing.  Do not use any lotions or creams on the incision until instructed by your surgeon.    ACTIVITY  Increase activity slowly as tolerated, but follow the weight bearing instructions below.   No driving for 6 weeks or until further direction given by your physician.  You cannot drive while taking narcotics.  No lifting or carrying greater than 10 lbs. until further directed by your surgeon. Avoid periods of inactivity such as sitting longer than an hour when not asleep. This helps prevent blood clots.  You may return to work once you are authorized by your doctor.     WEIGHT BEARING   Weight bearing as tolerated  with assist device (walker, cane, etc) as directed, use it as long as suggested by your surgeon or therapist, typically at least 2-3 weeks.   EXERCISES  Results are often greatly improved when you follow the exercise, range of motion and muscle strengthening exercises prescribed by your doctor. Safety measures are also important to protect the joint from further injury. Any time any of these exercises cause you to have increased pain or swelling, decrease what you are doing until you are comfortable again and then slowly increase them. If you have problems or questions, call your caregiver or physical therapist for advice.   Rehabilitation is important. After just a few days of immobilization, the muscles of the leg can become weakened and shrink (atrophy).  These exercises are designed to build up the tone and strength of the thigh and leg muscles and to improve motion.   These exercises can be done on a training (exercise) mat, on the floor, on a table or on a bed. Use whatever works the best and  is most comfortable for you.    Use music or television while you are exercising so that the exercises are a pleasant break in your day. This will make your life better with the exercises acting as a break in your routine that you can look forward to.   Perform all exercises about fifteen times, three times per day or as directed.  You should exercise both legs.  Exercises include:  Quad Sets - Tighten up the muscle on the front of the thigh (Quad) and hold for 5-10 seconds.   Straight Leg Raises - With your knee straight (if you were given a brace, keep it on), lift the leg to 60 degrees, hold for 3 seconds, and slowly lower the leg.  Perform this exercise against resistance later as your leg gets stronger.  Leg Slides: Lying on your back, slowly slide your foot toward your buttocks, bending your knee up off the floor (only go as far as is comfortable). Then slowly slide your foot back down until your leg is flat on the floor again.  Angel Wings: Lying on your back spread your legs to the side as far apart as you can without causing discomfort.  Hamstring Strength:  Lying on your back, push your heel against the floor with your leg straight by tightening up the muscles of your buttocks.  Repeat, but this time bend your knee to a comfortable angle, and push your heel against the floor.  You may put a pillow under the heel to make it more comfortable if necessary.   A rehabilitation program following hospitalization can speed recovery and prevent re-injury in the future due to weakened muscles. Contact your doctor or a physical therapist for more information on knee rehabilitation.    CONSTIPATION  Constipation is defined medically as fewer than three stools per week and severe constipation as less than one stool per week.  Even if you have a regular bowel pattern at home, your normal regimen is likely to be disrupted due to multiple reasons following surgery.  Combination of anesthesia, postoperative  narcotics, change in appetite and fluid intake all can affect your bowels.   YOU MUST use at least one of the following options; they are listed in order of increasing strength to get the job done.  They are all available over the counter, and you may need to use some, POSSIBLY even all of these options:    Drink plenty of fluids (prune  juice may be helpful) and high fiber foods Colace 100 mg by mouth twice a day  Miralax (polyethylene glycol) once or twice a day as needed.  If you have tried all these things and are unable to have a bowel movement in the first 3-4 days after hospitalization call either your surgeon or your primary doctor.    If you experience loose stools or diarrhea, hold the medications until you stool forms back up.  If your symptoms do not get better within 1 week or if they get worse, check with your doctor.  If you experience "the worst abdominal pain ever" or develop nausea or vomiting, please contact the office immediately for further recommendations for treatment.   ITCHING:  If you experience itching with your medications, try taking only a single pain pill, or even half a pain pill at a time.  You can also use Benadryl over the counter for itching or also to help with sleep.   TED HOSE STOCKINGS:  Use stockings on both legs until for at least 2 weeks or as directed by physician office. They may be removed at night for sleeping.  MEDICATIONS:  See your medication summary on the "After Visit Summary" that nursing will review with you.  You may have some home medications which will be placed on hold until you complete the course of blood thinner medication.  It is important for you to complete the blood thinner medication as prescribed.  PRECAUTIONS:  If you experience chest pain or shortness of breath - call 911 immediately for transfer to the hospital emergency department.   If you develop a fever greater that 101 F, purulent drainage from wound, increased redness or  drainage from wound, foul odor from the wound/dressing, or calf pain - CONTACT YOUR SURGEON.                                                   FOLLOW-UP APPOINTMENTS:  If you do not already have a post-op appointment, please call the office for an appointment to be seen by your surgeon.  Guidelines for how soon to be seen are listed in your "After Visit Summary", but are typically between 1-4 weeks after surgery.  MAKE SURE YOU:  Understand these instructions.  Get help right away if you are not doing well or get worse.    Thank you for letting us be a part of your medical care team.  It is a privilege we respect greatly.  We hope these instructions will help you stay on track for a fast and full recovery!   Do not put a pillow under the knee. Place it under the heel.   Complete by:  As directed    Place gray foam block, curve side up under heel at all times except when in CPM or when walking.  DO NOT modify, tear, cut, or change in any way the gray foam block.   Increase activity slowly as tolerated   Complete by:  As directed    Patient may shower   Complete by:  As directed    Do Not get right leg wounds wet      Follow-up Information    Elsie Saas, MD Follow up on 12/30/2017.   Specialty:  Orthopedic Surgery Why:  appointment time 2 pm Contact information: [place]  [address] [place] [address] [phone]            Signed: Linda Hedges 12/28/2017, 9:23 AM

## 2017-12-28 NOTE — Progress Notes (Signed)
Called and gave report to [facility]

## 2017-12-30 ENCOUNTER — Telehealth: Payer: Self-pay

## 2017-12-30 DIAGNOSIS — S83511D Sprain of anterior cruciate ligament of right knee, subsequent encounter: Secondary | ICD-10-CM | POA: Diagnosis not present

## 2017-12-30 NOTE — Telephone Encounter (Signed)
LVM for pt to call back as soon as possible.   Pt dc'ed from Hospital Admission on 12/28/2017. Pt has a Hosp Follow up on 01/13/2018

## 2017-12-31 ENCOUNTER — Non-Acute Institutional Stay (SKILLED_NURSING_FACILITY): Payer: Medicare Other | Admitting: Internal Medicine

## 2017-12-31 ENCOUNTER — Encounter: Payer: Self-pay | Admitting: Internal Medicine

## 2017-12-31 DIAGNOSIS — S76109A Unspecified injury of unspecified quadriceps muscle, fascia and tendon, initial encounter: Secondary | ICD-10-CM | POA: Diagnosis not present

## 2017-12-31 DIAGNOSIS — I4510 Unspecified right bundle-branch block: Secondary | ICD-10-CM

## 2017-12-31 DIAGNOSIS — D62 Acute posthemorrhagic anemia: Secondary | ICD-10-CM

## 2017-12-31 DIAGNOSIS — Z955 Presence of coronary angioplasty implant and graft: Secondary | ICD-10-CM

## 2017-12-31 DIAGNOSIS — L03115 Cellulitis of right lower limb: Secondary | ICD-10-CM

## 2017-12-31 DIAGNOSIS — R001 Bradycardia, unspecified: Secondary | ICD-10-CM | POA: Diagnosis not present

## 2017-12-31 DIAGNOSIS — I25119 Atherosclerotic heart disease of native coronary artery with unspecified angina pectoris: Secondary | ICD-10-CM | POA: Diagnosis not present

## 2017-12-31 NOTE — Progress Notes (Signed)
Patient ID: Hailey [name], female   DOB: [birth year], 83 y.o.   MRN: [id number]  Provider:  Rexene Edison. Mariea Clonts, D.O., C.M.D. Location:  [facility] Room Number: [place] of Service:  SNF (31)  PCP: Plotnikov, Evie Lacks, MD Patient Care Team: Plotnikov, Evie Lacks, MD as PCP - General  Extended Emergency Contact Information Primary Emergency Contact: Waxman,Fred Address: [address], [place] Mobile Phone: [phone] Relation: Spouse  Code Status: FULL CODE Goals of Care: Advanced Directive information Advanced Directives 12/31/2017  Does Patient Have a Medical Advance Directive? No  Type of Advance Directive -  Does patient want to make changes to medical advance directive? -  Copy of [place] in Chart? -  Would patient like information on creating a medical advance directive? No - Patient declined   Chief Complaint  Patient presents with  . New Admit To SNF    new admission    HPI: Patient is a 83 y.o. female seen today for admission to [facility] for PT, OT s/p hospitalization for fall at home with right leg hematoma and lacerations and subsequent near syncope at Dr. Archie Endo office.  She has a h/o htn, p afib, CAD with angina and DES to her RCA within the year, bradycardia in the 40s, RBBB on EKG, lumbar DDD with myelopathy, IT band syndrome.  [name] (normally very active, attends gym and has a Clinical research associate) was going about her business on 4/14 carrying a pile of laundry through the living room when she fell over a clear plastic table that was misplaced.  She hit her knee on the carpet and sustained a large hematoma and 3 abrasions on the knee and lower leg.  She went to the ED on 4/14 the first time and the wounds were evaluated, imaging obtained that suggested a distal quadriceps tendon tear, prescribed pain medications and she was released with f/u with Dr. Noemi Chapel in his  office to arrange for surgical repair.  She then followed up with him on 4/16 in his office where she became very lightheaded and nearly passed out.  She was transferred back to the ED and admitted 12/24/17-12/28/17.  There, she was diagnosed with cellulitis of the right leg with a large prepatellar hematoma and open wounds.  She was treated with antibiotics (initially ancef, later rocephin IV, then changed to doxycycline and ceftin from 4/20-4/30/19 and was to be awaiting surgery.  F/u imaging and evaluation determined she did not need surgery as she was able to flex and extend her right knee well w/o intervention.  She continued to have large amounts of bleeding from the largest most superior wound over the knee itself requiring mepilex and numerous gauze pads and an abd pad and then an ace wrap to absorb this.  Her eliquis dose was reduced at the hospital to 2.5mg  po bid due to the perfuse bleeding and she got a transfusion of 2 units prbcs.    When seen, she reports only having pain when she moves the leg, none at rest.  She notes that there has been some bleeding through onto the ace wrap.  Outside of fatigue, she otherwise feels well.  She saw Dr. Noemi Chapel again yesterday and has another appt tomorrow for re-evaluation.  She does not live here on [address] [place] [address] so it will be as easy to go to his office for  dressing changes as it would be to come here.  She is eager to get home since she continues to have bleeding which is preventing her from participating in a true therapy program until she gets cleared by Dr. Noemi Chapel for that.  He has said she can go home and they've been encouraging her to walk.  When her wound was assessed today, mepilex was in place, but had shifted slightly downward due to moving so an additional one was added at the superior aspect of the proximal wound.  She had some serosanguinous drainage on the medial lower leg w/o any obvious wound or excoriation and another wound  laterally on the lower leg (excoriation) with a few drops of bleeding on the dressing. Gauze and abd pad were replaced and leg was wrapped somewhat loosely with coban and ted hose replaced on right leg.  Pt reports she plans to get up hourly while at home to prevent increased pain she has if she stays still too long.    Otherwise, bowels are moving well.  She has no lightheadedness or dizziness.  No chest pain, sob, or urinary concerns.  She slept well last night.  She follows up with Dr. Tamala Julian tomorrow, as well.    They have ordered a recliner for her and it should be arriving today.  We reviewed keeping the wound dry and she may purchase a special device to do this when showering.    Past Medical History:  Diagnosis Date  . Acute blood loss anemia 12/25/2017  . Allergic rhinitis   . Anxiety   . Arthritis    "my whole spine" (07/01/2017)  . Atrial fibrillation ([place])   . Bradycardia with 41-50 beats per minute 12/27/2017  . Cellulitis of leg, right with large prepatella hematoma and open wounds 12/26/2017  . CKD (chronic kidney disease) stage 2, GFR 60-89 ml/min   . Coronary artery disease    10/18 PCI/DES to p/m LCx with cutting balloon to mLcx  . Diverticulosis of colon   . GERD (gastroesophageal reflux disease)   . Hip bursitis 2010   Dr Para March, Post op seroma  . History of colon polyps   . HTN (hypertension)   . IBS (irritable bowel syndrome)    constipation predominant - Dr Earlean Shawl  . Lichen sclerosus   . Osteopenia 11/2016   T score -2.0 FRAX 15%/4.3%  . PAC (premature atrial contraction)    Symptomatiic  . Right bundle branch block (RBBB) on electrocardiogram (ECG) 12/27/2017  . Scoliosis   . SVT (supraventricular tachycardia) ([place])    brief history   Past Surgical History:  Procedure Laterality Date  . ANTERIOR AND POSTERIOR VAGINAL REPAIR  01/2002   Archie Endo 01/23/2011  . APPENDECTOMY  1948  . CARDIAC CATHETERIZATION  06/26/2017  . CORONARY ANGIOPLASTY WITH STENT PLACEMENT   07/01/2017  . CORONARY ATHERECTOMY N/A 07/01/2017   Procedure: CORONARY ATHERECTOMY;  Surgeon: Belva Crome, MD;  Location: [facility];  Service: Cardiovascular;  Laterality: N/A;  . CORONARY STENT INTERVENTION N/A 07/01/2017   Procedure: CORONARY STENT INTERVENTION;  Surgeon: Belva Crome, MD;  Location: [facility];  Service: Cardiovascular;  Laterality: N/A;  . HAMMER TOE SURGERY    . HEMORRHOID BANDING    . HIP SURGERY Left 04/2009   hip examination under anesthesia followed by greater trochanteric bursectomy; iliotibial band tenotomy/notes 01/20/2011  . KNEE BURSECTOMY Right 04/2009   Archie Endo 01/09/2011  . LEFT HEART CATH AND CORONARY ANGIOGRAPHY N/A 06/26/2017  Procedure: LEFT HEART CATH AND CORONARY ANGIOGRAPHY;  Surgeon: Belva Crome, MD;  Location: [facility];  Service: Cardiovascular;  Laterality: N/A;  . PUBOVAGINAL SLING  01/2002   Archie Endo 01/23/2011  . REDUCTION MAMMAPLASTY    . TEMPORARY PACEMAKER N/A 07/01/2017   Procedure: TEMPORARY PACEMAKER;  Surgeon: Belva Crome, MD;  Location: [facility];  Service: Cardiovascular;  Laterality: N/A;  . VAGINAL HYSTERECTOMY  01/2002   Vaginal hysterectomy, bilateral salpingo-oophorectomy/notes 01/23/2011    reports that she quit smoking about 38 years ago. Her smoking use included cigarettes. She has a 7.00 pack-year smoking history. She has never used smokeless tobacco. She reports that she drinks about 12.6 oz of alcohol per week. She reports that she does not use drugs. Social History   Socioeconomic History  . Marital status: Married    Spouse name: Not on file  . Number of children: 2  . Years of education: Not on file  . Highest education level: Not on file  Occupational History  . Occupation: Patent attorney: RETIRED  Social Needs  . Financial resource strain: Not on file  . Food insecurity:    Worry: Not on file    Inability: Not on file  . Transportation needs:    Medical:  Not on file    Non-medical: Not on file  Tobacco Use  . Smoking status: Former Smoker    Packs/day: 0.25    Years: 28.00    Pack years: 7.00    Types: Cigarettes    Last attempt to quit: 1981    Years since quitting: 38.3  . Smokeless tobacco: Never Used  Substance and Sexual Activity  . Alcohol use: Yes    Alcohol/week: 12.6 oz    Types: 7 Standard drinks or equivalent, 14 Shots of liquor per week    Comment: 07/01/2017 "couple shots/day"  . Drug use: No  . Sexual activity: Yes    Birth control/protection: Surgical    Comment: 1st intercourse 83 yo--Fewer than 5 partners  Lifestyle  . Physical activity:    Days per week: Not on file    Minutes per session: Not on file  . Stress: Not on file  Relationships  . Social connections:    Talks on phone: Not on file    Gets together: Not on file    Attends religious service: Not on file    Active member of club or organization: Not on file    Attends meetings of clubs or organizations: Not on file    Relationship status: Not on file  . Intimate partner violence:    Fear of current or ex partner: Not on file    Emotionally abused: Not on file    Physically abused: Not on file    Forced sexual activity: Not on file  Other Topics Concern  . Not on file  Social History Narrative   Regular Exercise -  YES          Functional Status Survey:    Family History  Problem Relation Age of Onset  . Colon cancer Mother   . Cancer Father        Prostate  . Diabetes Father   . Cancer Brother        Prostate, pancreas  . Cancer Son        Stomach  . Heart attack Neg Hx   . Stroke Neg Hx     Health Maintenance  Topic Date Due  .  PNA vac Low Risk Adult (2 of 2 - PPSV23) 01/26/2015  . INFLUENZA VACCINE  04/10/2018  . TETANUS/TDAP  12/23/2027  . DEXA SCAN  Completed    Allergies  Allergen Reactions  . Macrobid [employer] Macro] Other (See Comments)    Nausea, stomach cramps, fatigue , headache.  . Meloxicam  Other (See Comments)    Jittery and headache  . Digoxin And Related     headaches  . Sulfamethoxazole-Trimethoprim Nausea Only    Outpatient Encounter Medications as of 12/31/2017  Medication Sig  . acetaminophen (TYLENOL) 160 MG/5ML solution Take 20 mLs (640 mg total) by mouth every 6 (six) hours as needed for mild pain or headache.  Marland Kitchen apixaban (ELIQUIS) 2.5 MG TABS tablet Take 1 tablet (2.5 mg total) by mouth 2 (two) times daily. Lower dose because of large hematoma on right knee  . atorvastatin (LIPITOR) 80 MG tablet TAKE 1 TABLET AT 6PM.  . calcium carbonate (TUMS - DOSED IN MG ELEMENTAL CALCIUM) 500 MG chewable tablet Chew 1 tablet (200 mg of elemental calcium total) by mouth 3 times/day as needed-between meals & bedtime for indigestion or heartburn.  . cefUROXime (CEFTIN) 500 MG tablet Take 1 tablet (500 mg total) by mouth 2 (two) times daily for 10 days.  . clopidogrel (PLAVIX) 75 MG tablet Take 1 tablet (75 mg total) by mouth daily.  Marland Kitchen doxycycline (VIBRA-TABS) 100 MG tablet Take 1 tablet (100 mg total) by mouth every 12 (twelve) hours.  . DULoxetine (CYMBALTA) 60 MG capsule TAKE (1) CAPSULE DAILY.  Marland Kitchen LORazepam (ATIVAN) 1 MG tablet Take 1 mg by mouth 2 (two) times daily as needed for anxiety or sleep.  . metoprolol succinate (TOPROL-XL) 25 MG 24 hr tablet Take 3 tablets (75 mg total) by mouth daily. Take with or immediately following a meal.  . nitroGLYCERIN (NITROSTAT) 0.4 MG SL tablet Place 0.4 mg under the tongue every 5 (five) minutes as needed for chest pain (Call 911 at 3rd dose within 15 minutes.).  Marland Kitchen pantoprazole (PROTONIX) 40 MG tablet TAKE 1 TABLET BY MOUTH DAILY.  Marland Kitchen polyethylene glycol (MIRALAX / GLYCOLAX) packet 17grams in 6 oz of water twice a day until bowel movement.  LAXITIVE.  Restart if two days since last bowel movement  . [DISCONTINUED] cetirizine (ZYRTEC) 10 MG tablet Take 10 mg by mouth daily as needed for allergies or rhinitis.   . [DISCONTINUED] clobetasol cream  (TEMOVATE) 0.05 % APPLY NIGHTLY AS DIRECTED. (Patient taking differently: Apply 1 application topically daily as needed (irritation). APPLY NIGHTLY AS DIRECTED.)  . [DISCONTINUED] diclofenac sodium (VOLTAREN) 1 % GEL APPLY 2 GRAMS TOPICALLY 4 TIMES DAILY.  . [DISCONTINUED] diphenoxylate-atropine (LOMOTIL) 2.5-0.025 MG tablet Take 1 tablet by mouth 4 (four) times daily as needed for diarrhea or loose stools.  . [DISCONTINUED] docusate sodium (COLACE) 100 MG capsule 1 tab 2 times a day while on narcotics.  STOOL SOFTENER  . [DISCONTINUED] ketotifen (ZADITOR) 0.025 % ophthalmic solution Place 1 drop into both eyes daily as needed (ITCHING).  . [DISCONTINUED] linaclotide (LINZESS) 290 MCG CAPS capsule Take 290 mcg by mouth daily as needed (constipation).  . [DISCONTINUED] sodium chloride (OCEAN) 0.65 % SOLN nasal spray Place 1 spray into both nostrils 2 (two) times daily as needed for congestion.   No facility-administered encounter medications on file as of 12/31/2017.     Review of Systems  Constitutional: Positive for activity change. Negative for appetite change, chills and fever.       Didn't eat well  at hospital and was constipated, but resolved here  HENT: Negative for congestion.   Eyes:       Glasses  Respiratory: Negative for apnea, cough, chest tightness, shortness of breath and wheezing.   Cardiovascular: Positive for leg swelling. Negative for chest pain and palpitations.  Gastrointestinal: Negative for abdominal distention, abdominal pain, anal bleeding, constipation, diarrhea and nausea.  Genitourinary: Negative for dysuria.  Musculoskeletal: Positive for gait problem and joint swelling.       Right knee pain with movement or weightbearing  Skin: Negative for pallor.  Neurological: Negative for dizziness and weakness.  Hematological: Bruises/bleeds easily.       Due to eliquis, plavix, had also been on asa  Psychiatric/Behavioral: Negative for confusion and sleep disturbance.     Vitals:   12/31/17 1056  BP: (!) 146/91  Pulse: 79  Resp: 20  Temp: 98.1 F (36.7 C)  TempSrc: Oral  SpO2: 99%  Weight: 171 lb (77.6 kg)  Height: 5\' 4"  (1.626 m)   Body mass index is 29.35 kg/m. Physical Exam  Constitutional: She is oriented to person, place, and time. She appears well-developed and well-nourished. No distress.  HENT:  Head: Normocephalic and atraumatic.  Eyes:  glasses  Cardiovascular: Intact distal pulses.  irreg irreg  Pulmonary/Chest: Effort normal and breath sounds normal. No respiratory distress.  Abdominal: Soft. Bowel sounds are normal.  Musculoskeletal: She exhibits tenderness.  Of right knee, no surrounding erythema, still swollen, has large abrasion with ongoing bleeding center of knee and a smaller one on lateral leg  Neurological: She is alert and oriented to person, place, and time. No cranial nerve deficit.  Skin: Capillary refill takes less than 2 seconds.  Multiple ecchymoses of arms, hands, legs  Psychiatric: She has a normal mood and affect.    Labs reviewed: Basic Metabolic Panel: Recent Labs    12/25/17 0900 12/26/17 0611 12/28/17 0705  NA 136 138 136  K 3.8 3.7 3.8  CL 108 109 107  CO2 20* 21* 22  GLUCOSE 104* 120* 98  BUN 17 18 14   CREATININE 1.06* 1.15* 0.98  CALCIUM 8.1* 8.2* 8.5*   Liver Function Tests: Recent Labs    12/24/17 1207  AST 19  ALT 14  ALKPHOS 52  BILITOT 0.7  PROT 5.7*  ALBUMIN 3.4*   No results for input(s): LIPASE, AMYLASE in the last 8760 hours. No results for input(s): AMMONIA in the last 8760 hours. CBC: Recent Labs    12/24/17 1207  12/26/17 0611 12/27/17 0940 12/28/17 0705  WBC 9.2   < > 10.8* 11.3* 8.7  NEUTROABS 6.6  --   --  8.6* 5.9  HGB 9.1*   < > 10.4* 10.5* 10.0*  HCT 28.5*   < > 31.7* 31.8* 30.7*  MCV 88.2   < > 89.3 89.8 90.3  PLT 265   < > 215 245 249   < > = values in this interval not displayed.   Cardiac Enzymes: Recent Labs    12/24/17 1207 12/25/17 0900   TROPONINI 0.03* <0.03   BNP: Invalid input(s): POCBNP Lab Results  Component Value Date   HGBA1C 5.6 07/04/2014   Lab Results  Component Value Date   TSH 2.11 04/29/2015   Lab Results  Component Value Date   [id number] 829 02/15/2010   No results found for: FOLATE No results found for: IRON, TIBC, FERRITIN  Imaging and Procedures obtained prior to SNF admission: Dg Chest 2 View  Result Date: 12/24/2017 CLINICAL DATA:  Syncope.  Preop for ruptured quadriceps. EXAM: CHEST - 2 VIEW COMPARISON:  07/03/2014 FINDINGS: Stable borderline heart size and mild aortic tortuosity. There is no edema, consolidation, effusion, or pneumothorax. Spondylosis. IMPRESSION: No evidence of active disease. Electronically Signed   By: Monte Fantasia M.D.   On: 12/24/2017 13:02   Dg Tibia/fibula Right  Result Date: 12/25/2017 CLINICAL DATA:  Right leg pain after fall 3 days ago. EXAM: RIGHT TIBIA AND FIBULA - 2 VIEW COMPARISON:  None. FINDINGS: There is no evidence of fracture or other focal bone lesions. Soft tissues are unremarkable. IMPRESSION: Normal right tibia and fibula. Electronically Signed   By: Marijo Conception, M.D.   On: 12/25/2017 08:47   Mr Knee Right Wo Contrast  Result Date: 12/25/2017 CLINICAL DATA:  83 year old female status post fall presents with pain and swelling EXAM: MRI OF THE RIGHT KNEE WITHOUT CONTRAST TECHNIQUE: Multiplanar, multisequence MR imaging of the knee was performed. No intravenous contrast was administered. COMPARISON:  None. FINDINGS: MENISCI Medial meniscus: Flap tear of the anterior horn involving the free edge and inferior articular surface, series 7, images 26 and 27 with secondary horizontal component. Free edge tear of the body of the medial meniscus, series 6/18. Inferior articular surfacing tear also noted of the posterior horn, series 7/27. Lateral meniscus: Free edge tear of the anterior horn and body of the lateral meniscus, series 6/16 and 17 and series 7/15.  LIGAMENTS Cruciates:  Full-thickness tear of the mid ACL.  Intact PCL. Collaterals:  Intact CARTILAGE Patellofemoral: The grade 2 and grade 3 chondral thinning and fissuring along the lower pole of the patella overlying the median ridge and lateral patellar facet, series 3/33 intact trochlear cartilage. Medial: Mild partial-thickness cartilage loss of the medial femorotibial compartment. Lateral:  No chondral defect Joint:  No joint effusion Popliteal Fossa: Trace fluid in the expected location of a popliteal cyst. Intact popliteus. Extensor Mechanism: Intact quadriceps and patellar tendons. Grade 2 sprain MPFL. Bones: No fracture, suspicious osseous lesion nor focal marrow abnormality. Other: Prepatellar hematoma measuring at least 8.9 x 2 x 7.7 cm. IMPRESSION: 1. Full-thickness tear of the ACL. 2. Intact quadriceps and patellar tendons. Grade 2 sprain of the medial patellofemoral ligament. 3. Flap tear of the anterior horn of the medial meniscus with inferior articular surfacing tear of the posterior horn and free edge tear of the body. 4. Free edge tear of the anterior horn and body of the lateral meniscus. 5. Mild chondrosis of the patellofemoral and medial femorotibial compartments. 6. Prepatellar hematoma approximately measuring 8.9 x 2 x 7.7 cm. Electronically Signed   By: Ashley Royalty M.D.   On: 12/25/2017 18:15   Dg Hip Unilat With Pelvis 2-3 Views Right  Result Date: 12/25/2017 CLINICAL DATA:  Right leg pain after fall 3 days ago. EXAM: DG HIP (WITH OR WITHOUT PELVIS) 2-3V RIGHT COMPARISON:  None. FINDINGS: There is no evidence of hip fracture or dislocation. There is no evidence of arthropathy or other focal bone abnormality. IMPRESSION: Normal right hip. Electronically Signed   By: Marijo Conception, M.D.   On: 12/25/2017 08:49    Assessment/Plan 1. Injury of quadriceps tendon -did not necessitate surgery -has f/u again tomorrow with Dr. Noemi Chapel, moving well considering wound over knee and  hematoma -will need to get dressing changes here on out--could be done at [facility] or continue at ortho office (pt does not live at [facility] itself, however, so not anymore convenient for clinic nurse here to do  compared to M/[facility] orthopedics office) -she was d/c'd home today from rehab at her request  2. Cellulitis of leg, right -with open wounds x 3 on right knee and leg -2/3 are still bleeding with superior middle of the knee wound being worst   3. Acute blood loss anemia due to right leg injury -due to knee injury with large hematoma and bleeding from wounds -s/p 2 units prbcs and change in anticoagulation--eliquis down to 2.5mg  po bid and off asa, still on clopidogrel--sees Dr. Tamala [place] tomorrow  4. Bradycardia with 41-50 beats per minute -noted during hospitalization and had orthostatic hypotension with near syncope--f/u with cardiology tomorrow as planned--HR now in 70s here  5. Right bundle branch block (RBBB) on electrocardiogram (ECG) -was noted in hospital  6. Coronary artery disease involving native coronary artery of native heart with angina pectoris ([place]) -noted, no active symptoms, cont current mgt and monitor  7. S/P drug eluting coronary stent placement -see #3, on lowered eliquis and off asa due to bleeding, still on her plavix as directed  -f/u Dr. Tamala [place] tomorrow  Family/ staff Communication: discussed with rehab RN, pt's husband, nurse manager also  Labs/tests ordered: no new  D/C [place].    Hassell Patras L. Darwin Guastella, D.O. [facility] [address], [place] [address] Cell Phone (Mon-Fri 8am-5pm):  [phone] On Call:  [phone] & follow prompts after 5pm & weekends Office Phone:  [phone] Office Fax:  [phone]

## 2018-01-01 ENCOUNTER — Ambulatory Visit: Payer: Medicare Other | Admitting: Interventional Cardiology

## 2018-01-01 ENCOUNTER — Encounter: Payer: Self-pay | Admitting: Interventional Cardiology

## 2018-01-01 VITALS — BP 122/76 | HR 64 | Ht 64.0 in | Wt 171.0 lb

## 2018-01-01 DIAGNOSIS — Z7901 Long term (current) use of anticoagulants: Secondary | ICD-10-CM | POA: Diagnosis not present

## 2018-01-01 DIAGNOSIS — I10 Essential (primary) hypertension: Secondary | ICD-10-CM

## 2018-01-01 DIAGNOSIS — D681 Hereditary factor XI deficiency: Secondary | ICD-10-CM

## 2018-01-01 DIAGNOSIS — I4510 Unspecified right bundle-branch block: Secondary | ICD-10-CM | POA: Diagnosis not present

## 2018-01-01 DIAGNOSIS — I480 Paroxysmal atrial fibrillation: Secondary | ICD-10-CM

## 2018-01-01 DIAGNOSIS — I25119 Atherosclerotic heart disease of native coronary artery with unspecified angina pectoris: Secondary | ICD-10-CM

## 2018-01-01 DIAGNOSIS — S83511D Sprain of anterior cruciate ligament of right knee, subsequent encounter: Secondary | ICD-10-CM | POA: Diagnosis not present

## 2018-01-01 MED ORDER — APIXABAN 5 MG PO TABS: 5.0000 mg | ORAL_TABLET | Freq: Two times a day (BID) | ORAL | 3 refills | Status: DC

## 2018-01-01 NOTE — Progress Notes (Signed)
Cardiology Office Note    Date:  01/01/2018   ID:  Hailey [name], Hailey [name] August 18, 1935, MRN [id number]  PCP:  Cassandria Anger, MD  Cardiologist: Sinclair Grooms, MD   Chief Complaint  Patient presents with  . Follow-up    PT OFFERS NO COMPLAINTS TODAY  . Coronary Artery Disease    History of Present Illness:  Hailey [name] is a 83 y.o. female has a history of PSVT, paroxysmal atrial fibrillation on chronic anticoagulation, CAD with PCI DES of RCA 2018, and hypertension.  She tripped on a desk injuring the right knee and having internal bleeding enough to require transfusion.  The bleeding was as severe because of the combination of Plavix and apixaban.  She is now 6 months out from stenting in the setting of chronic coronary disease.  The plan was to discontinue Plavix on today's visit.  She has had no symptoms to suggest recurrent angina.  Because of bleeding 1 week ago her apixaban dose has been decreased to 2.5 mg twice daily.   Past Medical History:  Diagnosis Date  . Acute blood loss anemia 12/25/2017  . Allergic rhinitis   . Anxiety   . Arthritis    "my whole spine" (07/01/2017)  . Atrial fibrillation ([place])   . Bradycardia with 41-50 beats per minute 12/27/2017  . Cellulitis of leg, right with large prepatella hematoma and open wounds 12/26/2017  . CKD (chronic kidney disease) stage 2, GFR 60-89 ml/min   . Coronary artery disease    10/18 PCI/DES to p/m LCx with cutting balloon to mLcx  . Diverticulosis of colon   . GERD (gastroesophageal reflux disease)   . Hip bursitis 2010   Dr Para March, Post op seroma  . History of colon polyps   . HTN (hypertension)   . IBS (irritable bowel syndrome)    constipation predominant - Dr Earlean Shawl  . Lichen sclerosus   . Osteopenia 11/2016   T score -2.0 FRAX 15%/4.3%  . PAC (premature atrial contraction)    Symptomatiic  . Right bundle branch block (RBBB) on electrocardiogram (ECG) 12/27/2017  . Scoliosis   . SVT  (supraventricular tachycardia) ([place])    brief history    Past Surgical History:  Procedure Laterality Date  . ANTERIOR AND POSTERIOR VAGINAL REPAIR  01/2002   Archie Endo 01/23/2011  . APPENDECTOMY  1948  . CARDIAC CATHETERIZATION  06/26/2017  . CORONARY ANGIOPLASTY WITH STENT PLACEMENT  07/01/2017  . CORONARY ATHERECTOMY N/A 07/01/2017   Procedure: CORONARY ATHERECTOMY;  Surgeon: Belva Crome, MD;  Location: [facility];  Service: Cardiovascular;  Laterality: N/A;  . CORONARY STENT INTERVENTION N/A 07/01/2017   Procedure: CORONARY STENT INTERVENTION;  Surgeon: Belva Crome, MD;  Location: [facility];  Service: Cardiovascular;  Laterality: N/A;  . HAMMER TOE SURGERY    . HEMORRHOID BANDING    . HIP SURGERY Left 04/2009   hip examination under anesthesia followed by greater trochanteric bursectomy; iliotibial band tenotomy/notes 01/20/2011  . KNEE BURSECTOMY Right 04/2009   Archie Endo 01/09/2011  . LEFT HEART CATH AND CORONARY ANGIOGRAPHY N/A 06/26/2017   Procedure: LEFT HEART CATH AND CORONARY ANGIOGRAPHY;  Surgeon: Belva Crome, MD;  Location: [facility];  Service: Cardiovascular;  Laterality: N/A;  . PUBOVAGINAL SLING  01/2002   Archie Endo 01/23/2011  . REDUCTION MAMMAPLASTY    . TEMPORARY PACEMAKER N/A 07/01/2017   Procedure: TEMPORARY PACEMAKER;  Surgeon: Belva Crome, MD;  Location: [facility]AS[facility]  [facility];  Service: Cardiovascular;  Laterality: N/A;  . VAGINAL HYSTERECTOMY  01/2002   Vaginal hysterectomy, bilateral salpingo-oophorectomy/notes 01/23/2011    Current Medications: Outpatient Medications Prior to Visit  Medication Sig Dispense Refill  . acetaminophen (TYLENOL) 160 MG/5ML solution Take 20 mLs (640 mg total) by mouth every 6 (six) hours as needed for mild pain or headache. 600 mL 0  . apixaban (ELIQUIS) 2.5 MG TABS tablet Take 1 tablet (2.5 mg total) by mouth 2 (two) times daily. Lower dose because of large hematoma on right knee 60 tablet 0  . atorvastatin  (LIPITOR) 80 MG tablet TAKE 1 TABLET AT 6PM. 90 tablet 3  . calcium carbonate (TUMS - DOSED IN MG ELEMENTAL CALCIUM) 500 MG chewable tablet Chew 1 tablet (200 mg of elemental calcium total) by mouth 3 times/day as needed-between meals & bedtime for indigestion or heartburn. 30 tablet 0  . cefUROXime (CEFTIN) 500 MG tablet Take 1 tablet (500 mg total) by mouth 2 (two) times daily for 10 days. 20 tablet 0  . clopidogrel (PLAVIX) 75 MG tablet Take 1 tablet (75 mg total) by mouth daily. 30 tablet 11  . doxycycline (VIBRA-TABS) 100 MG tablet Take 1 tablet (100 mg total) by mouth every 12 (twelve) hours. 28 tablet 0  . DULoxetine (CYMBALTA) 60 MG capsule TAKE (1) CAPSULE DAILY. 90 capsule 1  . LORazepam (ATIVAN) 1 MG tablet Take 1 mg by mouth 2 (two) times daily as needed for anxiety or sleep.    . metoprolol succinate (TOPROL-XL) 25 MG 24 hr tablet Take 3 tablets (75 mg total) by mouth daily. Take with or immediately following a meal. 270 tablet 3  . nitroGLYCERIN (NITROSTAT) 0.4 MG SL tablet Place 0.4 mg under the tongue every 5 (five) minutes as needed for chest pain (Call 911 at 3rd dose within 15 minutes.).    Marland Kitchen pantoprazole (PROTONIX) 40 MG tablet TAKE 1 TABLET BY MOUTH DAILY. 90 tablet 3  . polyethylene glycol (MIRALAX / GLYCOLAX) packet 17grams in 6 oz of water twice a day until bowel movement.  LAXITIVE.  Restart if two days since last bowel movement (Patient not taking: Reported on 01/01/2018) 14 each 0   No facility-administered medications prior to visit.      Allergies:   Macrobid [nitrofurantoin monohyd macro]; Meloxicam; Digoxin and related; and Sulfamethoxazole-trimethoprim   Social History   Socioeconomic History  . Marital status: Married    Spouse name: Not on file  . Number of children: 2  . Years of education: Not on file  . Highest education level: Not on file  Occupational History  . Occupation: Patent attorney: RETIRED  Social Needs  . Financial resource strain:  Not on file  . Food insecurity:    Worry: Not on file    Inability: Not on file  . Transportation needs:    Medical: Not on file    Non-medical: Not on file  Tobacco Use  . Smoking status: Former Smoker    Packs/day: 0.25    Years: 28.00    Pack years: 7.00    Types: Cigarettes    Last attempt to quit: 1981    Years since quitting: 38.3  . Smokeless tobacco: Never Used  Substance and Sexual Activity  . Alcohol use: Yes    Alcohol/week: 12.6 oz    Types: 7 Standard drinks or equivalent, 14 Shots of liquor per week    Comment: 07/01/2017 "couple shots/day"  . Drug use: No  .  Sexual activity: Yes    Birth control/protection: Surgical    Comment: 1st intercourse 83 yo--Fewer than 5 partners  Lifestyle  . Physical activity:    Days per week: Not on file    Minutes per session: Not on file  . Stress: Not on file  Relationships  . Social connections:    Talks on phone: Not on file    Gets together: Not on file    Attends religious service: Not on file    Active member of club or organization: Not on file    Attends meetings of clubs or organizations: Not on file    Relationship status: Not on file  Other Topics Concern  . Not on file  Social History Narrative   Regular Exercise -  YES           Family History:  The patient's family history includes Cancer in her brother, father, and son; Colon cancer in her mother; Diabetes in her father.   ROS:   Please see the history of present illness.    Ecchymoses but otherwise no complaints.  The right knee is immobilized. All other systems reviewed and are negative.   PHYSICAL EXAM:   VS:  BP 122/76   Pulse 64   Ht 5\' 4"  (1.626 m)   Wt 171 lb (77.6 kg)   BMI 29.35 kg/m    GEN: Well nourished, well developed, in no acute distress  HEENT: normal  Neck: no JVD, carotid bruits, or masses Cardiac: RRR; no murmurs, rubs, or gallops,no edema  Respiratory:  clear to auscultation bilaterally, normal work of breathing GI:  soft, nontender, nondistended, + BS MS: no deformity or atrophy.  Right knee is immobilized.  Bandage and compression wrap in place. Skin: warm and dry, no rash Neuro:  Alert and Oriented x 3, Strength and sensation are intact Psych: euthymic mood, full affect  Wt Readings from Last 3 Encounters:  01/01/18 171 lb (77.6 kg)  12/31/17 171 lb (77.6 kg)  12/25/17 167 lb (75.8 kg)      Studies/Labs Reviewed:   EKG:  EKG  Not done  Recent Labs: 12/24/2017: ALT 14 12/28/2017: BUN 14; Creatinine, Ser 0.98; Hemoglobin 10.0; Platelets 249; Potassium 3.8; Sodium 136   Lipid Panel    Component Value Date/Time   CHOL 171 11/26/2016 0844   TRIG 99 11/26/2016 0844   TRIG 183 (H) 07/26/2006 0903   HDL 90 11/26/2016 0844   CHOLHDL 1.9 11/26/2016 0844   CHOLHDL 2.2 07/04/2014 0528   VLDL 19 07/04/2014 0528   LDLCALC 61 11/26/2016 0844   LDLDIRECT 89.1 06/29/2013 1604    Additional studies/ records that were reviewed today include:  None    ASSESSMENT:    1. Coronary artery disease involving native coronary artery of native heart with angina pectoris ([place])   2. Right bundle branch block (RBBB) on electrocardiogram (ECG)   3. 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 Chronic anticoagulation   6. Essential hypertension      PLAN:  In order of problems listed above:  1. Without angina.  Right coronary stent implantation in the setting of chronic coronary disease.  Plavix is discontinued today especially given the significant bleeding issues that she has had recently. 2. Not reevaluated. 3. Currently in sinus rhythm based upon clinical exam 4. Factor XI deficiency with increased propensity for clotting. 5. Increase apixaban to 5 mg twice daily 6. Excellent blood pressure control.  Overall  no change in therapy other than the following: Discontinue Plavix.  Continue Eliquis but increase the dose to 5 mg twice daily.  36-month  follow-up.    Medication Adjustments/Labs and Tests Ordered: Current medicines are reviewed at length with the patient today.  Concerns regarding medicines are outlined above.  Medication changes, Labs and Tests ordered today are listed in the Patient Instructions below. There are no Patient Instructions on file for this visit.   Signed, Sinclair Grooms, MD  01/01/2018 4:16 PM    [facility] [address], [place], [place]  [address] Phone: [phone]; Fax: [phone]

## 2018-01-01 NOTE — Patient Instructions (Signed)
Medication Instructions:  1) DISCONTINUE Plavix 2) INCREASE Eliquis to 5mg  twice daily  Labwork: None  Testing/Procedures: None  Follow-Up: Your physician wants you to follow-up in: 6 months with Dr. Tamala Julian.  You will receive a reminder letter in the mail two months in advance. If you don't receive a letter, please call our office to schedule the follow-up appointment.   Any Other Special Instructions Will Be Listed Below (If Applicable).     If you need a refill on your cardiac medications before your next appointment, please call your pharmacy.

## 2018-01-02 NOTE — Progress Notes (Signed)
I recommended DC plavix and resume standard dose Eliquis, 5 mg BID.

## 2018-01-03 ENCOUNTER — Ambulatory Visit: Admit: 2018-01-03 | Payer: Medicare Other | Admitting: Orthopedic Surgery

## 2018-01-03 DIAGNOSIS — S83511D Sprain of anterior cruciate ligament of right knee, subsequent encounter: Secondary | ICD-10-CM | POA: Diagnosis not present

## 2018-01-03 SURGERY — REPAIR, TENDON, QUADRICEPS
Anesthesia: General | Laterality: Right

## 2018-01-06 DIAGNOSIS — S83511D Sprain of anterior cruciate ligament of right knee, subsequent encounter: Secondary | ICD-10-CM | POA: Diagnosis not present

## 2018-01-07 ENCOUNTER — Encounter

## 2018-01-09 ENCOUNTER — Other Ambulatory Visit (INDEPENDENT_AMBULATORY_CARE_PROVIDER_SITE_OTHER): Payer: Self-pay | Admitting: Orthopedic Surgery

## 2018-01-09 ENCOUNTER — Encounter (INDEPENDENT_AMBULATORY_CARE_PROVIDER_SITE_OTHER): Payer: Self-pay | Admitting: Orthopedic Surgery

## 2018-01-09 ENCOUNTER — Ambulatory Visit (INDEPENDENT_AMBULATORY_CARE_PROVIDER_SITE_OTHER): Payer: Medicare Other | Admitting: Orthopedic Surgery

## 2018-01-09 DIAGNOSIS — L97819 Non-pressure chronic ulcer of other part of right lower leg with unspecified severity: Secondary | ICD-10-CM

## 2018-01-09 DIAGNOSIS — L97814 Non-pressure chronic ulcer of other part of right lower leg with necrosis of bone: Secondary | ICD-10-CM

## 2018-01-09 DIAGNOSIS — S8001XS Contusion of right knee, sequela: Secondary | ICD-10-CM

## 2018-01-09 DIAGNOSIS — S83511D Sprain of anterior cruciate ligament of right knee, subsequent encounter: Secondary | ICD-10-CM | POA: Diagnosis not present

## 2018-01-09 NOTE — Progress Notes (Signed)
Office Visit Note   Patient: Hailey [name]           Date of Birth: 11-26-1934           MRN: [id number] Visit Date: 01/09/2018              Requested by: Cassandria Anger, MD [place], [place] [address] PCP: Cassandria Anger, MD  Chief Complaint  Patient presents with  . Right Knee - Pain, Edema      HPI: Patient is an 83 year old woman who is seen for initial evaluation for a massive hematoma with skin loss and soft tissue destruction over her patella secondary to a fall.  Patient has undergone good conservative therapy and presents at this time with worsening of her symptoms.  Traumatic hematoma over her right patella secondary to therapy with Eliquis and Plavix with a history of a cardiac stent atrial fibrillation.   Assessment & Plan: Visit Diagnoses:  1. Ulcer of knee, right, with necrosis of bone ([place])   2. Traumatic hematoma of knee, right, sequela     Plan: Discussed with the patient we need to proceed aggressively with surgical intervention for limb salvage.  We will plan for surgery tomorrow with debridement application of an installation wound VAC followed by surgery on Wednesday with repeat debridement and repeat the installation wound VAC with anticipated split-thickness skin graft in a week next Friday.  Patient will need to be hospitalized during this time with IV antibiotics.  Discussed that without aggressive wound care patient is at risk of loss of limb.  Follow-Up Instructions: Return in about 2 weeks (around 01/23/2018).   Ortho Exam  Patient is alert, oriented, no adenopathy, well-dressed, normal affect, normal respiratory effort. Patient has a good dorsalis pedis pulse.  She has significant venous stasis swelling in the right lower extremity with brawny skin color changes.  She has a large necrotic ulcer with hematoma over the patella the ulcer is 5 x 6 cm and 2 cm deep to the patella but there is exposed within the wound bed hematoma.   There is no odor or cellulitis no abscess.  Imaging: No results found.   Labs: Lab Results  Component Value Date   HGBA1C 5.6 07/04/2014   ESRSEDRATE 31 (H) 08/30/2014   ESRSEDRATE 29 (H) 12/18/2010   ESRSEDRATE 39 (H) 06/14/2009   CRP 0.7 08/30/2014   LABURIC 6.1 06/14/2009   LABORGA ESCHERICHIA COLI 02/19/2017    Lab Results  Component Value Date/Time   HGBA1C 5.6 07/04/2014 05:00 AM    There is no height or weight on file to calculate BMI.  Orders:  No orders of the defined types were placed in this encounter.  No orders of the defined types were placed in this encounter.    Procedures: No procedures performed  Clinical Data: No additional findings.  ROS:  All other systems negative, except as noted in the HPI. Review of Systems  Objective: Vital Signs: There were no vitals taken for this visit.  Specialty Comments:  No specialty comments available.  PMFS History: Patient Active Problem List   Diagnosis Date Noted  . Bradycardia with 41-50 beats per minute 12/27/2017  . Right bundle branch block (RBBB) on electrocardiogram (ECG) 12/27/2017  . Quadriceps tendon rupture 12/24/2017  . CKD (chronic kidney disease), stage II 12/24/2017  . Cellulitis of leg, right with large prepatella hematoma and open wounds 12/24/2017  . Iliotibial band syndrome of right side 12/16/2017  . Depression  07/11/2017  . Intervertebral lumbar disc disorder with myelopathy, lumbar region 04/25/2017  . Chronic anticoagulation 05/24/2015  . Coronary artery disease involving native coronary artery of native heart with angina pectoris ([place]) 12/20/2014  . Iliotibial band syndrome of left side 11/16/2014  . Diverticulitis of colon 08/30/2014  . PAF (paroxysmal atrial fibrillation) ([place]) 11/21/2012  . Greater trochanteric bursitis of both hips 05/21/2012  . 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 Osteoarthritis of left hip 07/03/2010  . Gutierrez DISEASE, LUMBOSACRAL SPINE  05/18/2010  . SYNCOPE 10/27/2008  . Essential hypertension 06/16/2007  . GERD 06/16/2007  . COLONIC POLYPS, HX OF 06/16/2007   Past Medical History:  Diagnosis Date  . Acute blood loss anemia 12/25/2017  . Allergic rhinitis   . Anxiety   . Arthritis    "my whole spine" (07/01/2017)  . Atrial fibrillation ([place])   . Bradycardia with 41-50 beats per minute 12/27/2017  . Cellulitis of leg, right with large prepatella hematoma and open wounds 12/26/2017  . CKD (chronic kidney disease) stage 2, GFR 60-89 ml/min   . Coronary artery disease    10/18 PCI/DES to p/m LCx with cutting balloon to mLcx  . Diverticulosis of colon   . GERD (gastroesophageal reflux disease)   . Hip bursitis 2010   Dr Para March, Post op seroma  . History of colon polyps   . HTN (hypertension)   . IBS (irritable bowel syndrome)    constipation predominant - Dr Earlean Shawl  . Lichen sclerosus   . Osteopenia 11/2016   T score -2.0 FRAX 15%/4.3%  . PAC (premature atrial contraction)    Symptomatiic  . Right bundle branch block (RBBB) on electrocardiogram (ECG) 12/27/2017  . Scoliosis   . SVT (supraventricular tachycardia) ([facility])    brief history    Family History  Problem Relation Age of Onset  . Colon cancer Mother   . Cancer Father        Prostate  . Diabetes Father   . Cancer Brother        Prostate, pancreas  . Cancer Son        Stomach  . Heart attack Neg Hx   . Stroke Neg Hx     Past Surgical History:  Procedure Laterality Date  . ANTERIOR AND POSTERIOR VAGINAL REPAIR  01/2002   Archie Endo 01/23/2011  . APPENDECTOMY  1948  . CARDIAC CATHETERIZATION  06/26/2017  . CORONARY ANGIOPLASTY WITH STENT PLACEMENT  07/01/2017  . CORONARY ATHERECTOMY N/A 07/01/2017   Procedure: CORONARY ATHERECTOMY;  Surgeon: Belva Crome, MD;  Location: [facility];  Service: Cardiovascular;  Laterality: N/A;  . CORONARY STENT INTERVENTION N/A 07/01/2017   Procedure: CORONARY STENT INTERVENTION;  Surgeon: Belva Crome, MD;   Location: [facility];  Service: Cardiovascular;  Laterality: N/A;  . HAMMER TOE SURGERY    . HEMORRHOID BANDING    . HIP SURGERY Left 04/2009   hip examination under anesthesia followed by greater trochanteric bursectomy; iliotibial band tenotomy/notes 01/20/2011  . KNEE BURSECTOMY Right 04/2009   Archie Endo 01/09/2011  . LEFT HEART CATH AND CORONARY ANGIOGRAPHY N/A 06/26/2017   Procedure: LEFT HEART CATH AND CORONARY ANGIOGRAPHY;  Surgeon: Belva Crome, MD;  Location: [facility];  Service: Cardiovascular;  Laterality: N/A;  . PUBOVAGINAL SLING  01/2002   Archie Endo 01/23/2011  . REDUCTION MAMMAPLASTY    . TEMPORARY PACEMAKER N/A 07/01/2017   Procedure: TEMPORARY PACEMAKER;  Surgeon: Belva Crome, MD;  [facility]: [facility]  [facility];  Service: Cardiovascular;  Laterality: N/A;  . VAGINAL HYSTERECTOMY  01/2002   Vaginal hysterectomy, bilateral salpingo-oophorectomy/notes 01/23/2011   Social History   Occupational History  . Occupation: Patent attorney: RETIRED  Tobacco Use  . Smoking status: Former Smoker    Packs/day: 0.25    Years: 28.00    Pack years: 7.00    Types: Cigarettes    Last attempt to quit: 1981    Years since quitting: 38.3  . Smokeless tobacco: Never Used  Substance and Sexual Activity  . Alcohol use: Yes    Alcohol/week: 12.6 oz    Types: 7 Standard drinks or equivalent, 14 Shots of liquor per week    Comment: 07/01/2017 "couple shots/day"  . Drug use: No  . Sexual activity: Yes    Birth control/protection: Surgical    Comment: 1st intercourse 83 yo--Fewer than 5 partners

## 2018-01-09 NOTE — Progress Notes (Signed)
Dr. Turk, Anesthesia, reviewed pt history; no new orders. 

## 2018-01-09 NOTE — Pre-Procedure Instructions (Signed)
    Hailey [name]  01/09/2018        Your procedure is scheduled on Friday, Jan 10, 2018  Report to [facility] Admitting at 8:00 A.M.  Call this number if you have problems the morning of surgery:  [phone]   Remember: Follow doctors instructions regarding Eliquis  Do not eat food or drink liquids after midnight.   Take these medicines the morning of surgery with A SIP OF WATER:  DULoxetine (CYMBALTA), metoprolol succinate (TOPROL-XL), pantoprazole (PROTONIX), ketotifen (ZADITOR) eye drops If needed: acetaminophen (TYLENOL) for pain, sodium chloride (OCEAN)  nasal spray for congestion, nitroGLYCERIN (NITROSTAT) for chest pain  Do not wear jewelry, make-up or nail polish.  Do not wear lotions, powders, or perfumes, or deodorant.  Do not shave 48 hours prior to surgery.    Do not bring valuables to the hospital.  [facility] is not responsible for any belongings or valuables.  Contacts, dentures or bridgework may not be worn into surgery.  Leave your suitcase in the car.  After surgery it may be brought to your room.  For patients admitted to the hospital, discharge time will be determined by your treatment team.  Patients discharged the day of surgery will not be allowed to drive home.   Please read over the following fact sheets that you were given.

## 2018-01-09 NOTE — Progress Notes (Signed)
Pt provided with pre-op instructions only. Pt stated " the doctor said that it didn't matter one way or another if I took the Eliquis. Pt made aware to stop taking vitamins, fish oil and herbal medications. Do not take any NSAIDs ie: Ibuprofen, Advil, Naproxen (Aleve), Motrin, BC and Goody Powder. Pt verbalized understanding of all pre-op instructions. Please complete assessment on DOS.

## 2018-01-10 ENCOUNTER — Inpatient Hospital Stay (HOSPITAL_COMMUNITY): Payer: Medicare Other | Admitting: Anesthesiology

## 2018-01-10 ENCOUNTER — Other Ambulatory Visit: Payer: Self-pay

## 2018-01-10 ENCOUNTER — Encounter (HOSPITAL_COMMUNITY): Payer: Self-pay | Admitting: General Practice

## 2018-01-10 ENCOUNTER — Encounter (HOSPITAL_COMMUNITY)
Admission: RE | Disposition: A | Discharge status: Home Health | Payer: Self-pay | Source: Ambulatory Visit | Attending: Orthopedic Surgery

## 2018-01-10 ENCOUNTER — Inpatient Hospital Stay (HOSPITAL_COMMUNITY)
Admission: RE | Admit: 2018-01-10 | Discharge: 2018-01-12 | DRG: 988 | Disposition: A | Discharge status: Home Health | Payer: Medicare Other | Source: Ambulatory Visit | Attending: Orthopedic Surgery | Admitting: Orthopedic Surgery

## 2018-01-10 DIAGNOSIS — F419 Anxiety disorder, unspecified: Secondary | ICD-10-CM | POA: Diagnosis present

## 2018-01-10 DIAGNOSIS — Z7901 Long term (current) use of anticoagulants: Secondary | ICD-10-CM

## 2018-01-10 DIAGNOSIS — Z955 Presence of coronary angioplasty implant and graft: Secondary | ICD-10-CM | POA: Diagnosis not present

## 2018-01-10 DIAGNOSIS — S8001XA Contusion of right knee, initial encounter: Secondary | ICD-10-CM | POA: Diagnosis present

## 2018-01-10 DIAGNOSIS — I471 Supraventricular tachycardia: Secondary | ICD-10-CM | POA: Diagnosis not present

## 2018-01-10 DIAGNOSIS — L97503 Non-pressure chronic ulcer of other part of unspecified foot with necrosis of muscle: Secondary | ICD-10-CM | POA: Diagnosis not present

## 2018-01-10 DIAGNOSIS — Z800 Family history of malignant neoplasm of digestive organs: Secondary | ICD-10-CM

## 2018-01-10 DIAGNOSIS — W19XXXA Unspecified fall, initial encounter: Secondary | ICD-10-CM | POA: Diagnosis present

## 2018-01-10 DIAGNOSIS — Z9049 Acquired absence of other specified parts of digestive tract: Secondary | ICD-10-CM

## 2018-01-10 DIAGNOSIS — L97814 Non-pressure chronic ulcer of other part of right lower leg with necrosis of bone: Secondary | ICD-10-CM

## 2018-01-10 DIAGNOSIS — M8580 Other specified disorders of bone density and structure, unspecified site: Secondary | ICD-10-CM | POA: Diagnosis present

## 2018-01-10 DIAGNOSIS — Z823 Family history of stroke: Secondary | ICD-10-CM | POA: Diagnosis not present

## 2018-01-10 DIAGNOSIS — Z86010 Personal history of colonic polyps: Secondary | ICD-10-CM | POA: Diagnosis not present

## 2018-01-10 DIAGNOSIS — I4891 Unspecified atrial fibrillation: Secondary | ICD-10-CM | POA: Diagnosis not present

## 2018-01-10 DIAGNOSIS — Z8042 Family history of malignant neoplasm of prostate: Secondary | ICD-10-CM

## 2018-01-10 DIAGNOSIS — Z833 Family history of diabetes mellitus: Secondary | ICD-10-CM | POA: Diagnosis not present

## 2018-01-10 DIAGNOSIS — Z87891 Personal history of nicotine dependence: Secondary | ICD-10-CM | POA: Diagnosis not present

## 2018-01-10 DIAGNOSIS — Z881 Allergy status to other antibiotic agents status: Secondary | ICD-10-CM

## 2018-01-10 DIAGNOSIS — Z950 Presence of cardiac pacemaker: Secondary | ICD-10-CM

## 2018-01-10 DIAGNOSIS — I129 Hypertensive chronic kidney disease with stage 1 through stage 4 chronic kidney disease, or unspecified chronic kidney disease: Secondary | ICD-10-CM | POA: Diagnosis present

## 2018-01-10 DIAGNOSIS — Z8249 Family history of ischemic heart disease and other diseases of the circulatory system: Secondary | ICD-10-CM

## 2018-01-10 DIAGNOSIS — Z79899 Other long term (current) drug therapy: Secondary | ICD-10-CM

## 2018-01-10 DIAGNOSIS — K219 Gastro-esophageal reflux disease without esophagitis: Secondary | ICD-10-CM | POA: Diagnosis present

## 2018-01-10 DIAGNOSIS — I2510 Atherosclerotic heart disease of native coronary artery without angina pectoris: Secondary | ICD-10-CM | POA: Diagnosis present

## 2018-01-10 DIAGNOSIS — I4510 Unspecified right bundle-branch block: Secondary | ICD-10-CM | POA: Diagnosis present

## 2018-01-10 DIAGNOSIS — L97813 Non-pressure chronic ulcer of other part of right lower leg with necrosis of muscle: Secondary | ICD-10-CM | POA: Diagnosis not present

## 2018-01-10 DIAGNOSIS — Z90710 Acquired absence of both cervix and uterus: Secondary | ICD-10-CM

## 2018-01-10 DIAGNOSIS — L97819 Non-pressure chronic ulcer of other part of right lower leg with unspecified severity: Secondary | ICD-10-CM

## 2018-01-10 DIAGNOSIS — Z888 Allergy status to other drugs, medicaments and biological substances status: Secondary | ICD-10-CM

## 2018-01-10 DIAGNOSIS — Z886 Allergy status to analgesic agent status: Secondary | ICD-10-CM

## 2018-01-10 DIAGNOSIS — N182 Chronic kidney disease, stage 2 (mild): Secondary | ICD-10-CM | POA: Diagnosis present

## 2018-01-10 DIAGNOSIS — M419 Scoliosis, unspecified: Secondary | ICD-10-CM | POA: Diagnosis not present

## 2018-01-10 DIAGNOSIS — Z7902 Long term (current) use of antithrombotics/antiplatelets: Secondary | ICD-10-CM

## 2018-01-10 DIAGNOSIS — Z882 Allergy status to sulfonamides status: Secondary | ICD-10-CM

## 2018-01-10 HISTORY — PX: I & D EXTREMITY: SHX5045

## 2018-01-10 LAB — PROTIME-INR
INR: 1.22
Prothrombin Time: 15.3 seconds — ABNORMAL HIGH (ref 11.4–15.2)

## 2018-01-10 SURGERY — IRRIGATION AND DEBRIDEMENT EXTREMITY
Anesthesia: General | Laterality: Right

## 2018-01-10 MED ORDER — ROCURONIUM BROMIDE 50 MG/5ML IV SOLN
INTRAVENOUS | Status: AC
  Filled 2018-01-10: qty 1

## 2018-01-10 MED ORDER — METHOCARBAMOL 500 MG PO TABS
500.0000 mg | ORAL_TABLET | Freq: Four times a day (QID) | ORAL | Status: DC | PRN
  Administered 2018-01-10 – 2018-01-11 (×3): 500 mg via ORAL
  Filled 2018-01-10 (×4): qty 1

## 2018-01-10 MED ORDER — APIXABAN 5 MG PO TABS
5.0000 mg | ORAL_TABLET | Freq: Two times a day (BID) | ORAL | Status: DC
  Administered 2018-01-10 – 2018-01-12 (×4): 5 mg via ORAL
  Filled 2018-01-10 (×4): qty 1

## 2018-01-10 MED ORDER — OXYCODONE HCL 5 MG PO TABS
10.0000 mg | ORAL_TABLET | ORAL | Status: DC | PRN
  Administered 2018-01-10: 10 mg via ORAL
  Administered 2018-01-11 (×2): 15 mg via ORAL
  Filled 2018-01-10 (×4): qty 3

## 2018-01-10 MED ORDER — PROPOFOL 10 MG/ML IV BOLUS
INTRAVENOUS | Status: AC
  Filled 2018-01-10: qty 20

## 2018-01-10 MED ORDER — MAGNESIUM CITRATE PO SOLN: 1.0000 | Freq: Once | ORAL | Status: DC | PRN

## 2018-01-10 MED ORDER — HYDROMORPHONE HCL 2 MG/ML IJ SOLN: 0.5000 mg | INTRAMUSCULAR | Status: DC | PRN

## 2018-01-10 MED ORDER — DULOXETINE HCL 60 MG PO CPEP
60.0000 mg | ORAL_CAPSULE | Freq: Every day | ORAL | Status: DC
  Administered 2018-01-11 – 2018-01-12 (×2): 60 mg via ORAL
  Filled 2018-01-10 (×2): qty 1

## 2018-01-10 MED ORDER — CHLORHEXIDINE GLUCONATE 4 % EX LIQD: 60.0000 mL | Freq: Once | CUTANEOUS | Status: DC

## 2018-01-10 MED ORDER — FENTANYL CITRATE (PF) 250 MCG/5ML IJ SOLN
INTRAMUSCULAR | Status: DC | PRN
  Administered 2018-01-10: 25 ug via INTRAVENOUS
  Administered 2018-01-10: 50 ug via INTRAVENOUS
  Administered 2018-01-10 (×3): 25 ug via INTRAVENOUS
  Administered 2018-01-10 (×2): 50 ug via INTRAVENOUS

## 2018-01-10 MED ORDER — SODIUM CHLORIDE 0.9 % IR SOLN
Status: DC | PRN
  Administered 2018-01-10: 3000 mL

## 2018-01-10 MED ORDER — LORAZEPAM 1 MG PO TABS
1.0000 mg | ORAL_TABLET | Freq: Every day | ORAL | Status: DC
  Administered 2018-01-10 – 2018-01-11 (×2): 1 mg via ORAL
  Filled 2018-01-10 (×2): qty 1

## 2018-01-10 MED ORDER — OXYCODONE HCL 5 MG PO TABS
5.0000 mg | ORAL_TABLET | ORAL | Status: DC | PRN
  Administered 2018-01-10: 5 mg via ORAL
  Filled 2018-01-10 (×2): qty 2

## 2018-01-10 MED ORDER — LACTATED RINGERS IV SOLN
INTRAVENOUS | Status: DC
  Administered 2018-01-10: 09:00:00 via INTRAVENOUS

## 2018-01-10 MED ORDER — FENTANYL CITRATE (PF) 100 MCG/2ML IJ SOLN
INTRAMUSCULAR | Status: AC
  Administered 2018-01-10: 50 ug via INTRAVENOUS
  Filled 2018-01-10: qty 2

## 2018-01-10 MED ORDER — LIDOCAINE 2% (20 MG/ML) 5 ML SYRINGE
INTRAMUSCULAR | Status: AC
  Filled 2018-01-10: qty 5

## 2018-01-10 MED ORDER — CEFAZOLIN SODIUM-DEXTROSE 1-4 GM/50ML-% IV SOLN
1.0000 g | Freq: Four times a day (QID) | INTRAVENOUS | Status: AC
  Administered 2018-01-10 – 2018-01-11 (×3): 1 g via INTRAVENOUS
  Filled 2018-01-10 (×3): qty 50

## 2018-01-10 MED ORDER — ACETAMINOPHEN 325 MG PO TABS
325.0000 mg | ORAL_TABLET | Freq: Four times a day (QID) | ORAL | Status: DC | PRN
  Administered 2018-01-11 (×2): 650 mg via ORAL
  Filled 2018-01-10 (×2): qty 2

## 2018-01-10 MED ORDER — PROPOFOL 10 MG/ML IV BOLUS
INTRAVENOUS | Status: DC | PRN
  Administered 2018-01-10: 120 mg via INTRAVENOUS

## 2018-01-10 MED ORDER — NITROGLYCERIN 0.4 MG SL SUBL: 0.4000 mg | SUBLINGUAL_TABLET | SUBLINGUAL | Status: DC | PRN

## 2018-01-10 MED ORDER — POLYETHYLENE GLYCOL 3350 17 G PO PACK: 17.0000 g | PACK | Freq: Every day | ORAL | Status: DC | PRN

## 2018-01-10 MED ORDER — ONDANSETRON HCL 4 MG PO TABS: 4.0000 mg | ORAL_TABLET | Freq: Four times a day (QID) | ORAL | Status: DC | PRN

## 2018-01-10 MED ORDER — 0.9 % SODIUM CHLORIDE (POUR BTL) OPTIME
TOPICAL | Status: DC | PRN
  Administered 2018-01-10: 1000 mL

## 2018-01-10 MED ORDER — KETOTIFEN FUMARATE 0.025 % OP SOLN
1.0000 [drp] | Freq: Every day | OPHTHALMIC | Status: DC
  Administered 2018-01-10 – 2018-01-12 (×3): 1 [drp] via OPHTHALMIC
  Filled 2018-01-10: qty 5

## 2018-01-10 MED ORDER — BISACODYL 10 MG RE SUPP: 10.0000 mg | Freq: Every day | RECTAL | Status: DC | PRN

## 2018-01-10 MED ORDER — PANTOPRAZOLE SODIUM 40 MG PO TBEC
40.0000 mg | DELAYED_RELEASE_TABLET | Freq: Every day | ORAL | Status: DC
  Administered 2018-01-10 – 2018-01-12 (×3): 40 mg via ORAL
  Filled 2018-01-10 (×3): qty 1

## 2018-01-10 MED ORDER — METOCLOPRAMIDE HCL 5 MG/ML IJ SOLN: 5.0000 mg | Freq: Three times a day (TID) | INTRAMUSCULAR | Status: DC | PRN

## 2018-01-10 MED ORDER — METOPROLOL SUCCINATE ER 50 MG PO TB24
75.0000 mg | ORAL_TABLET | Freq: Every day | ORAL | Status: DC
  Administered 2018-01-11 – 2018-01-12 (×2): 75 mg via ORAL
  Filled 2018-01-10 (×2): qty 1

## 2018-01-10 MED ORDER — FENTANYL CITRATE (PF) 250 MCG/5ML IJ SOLN
INTRAMUSCULAR | Status: AC
  Filled 2018-01-10: qty 5

## 2018-01-10 MED ORDER — SODIUM CHLORIDE 0.9 % IV SOLN
INTRAVENOUS | Status: DC
  Administered 2018-01-10: 16:00:00 via INTRAVENOUS

## 2018-01-10 MED ORDER — OXYCODONE HCL 5 MG/5ML PO SOLN: 5.0000 mg | Freq: Once | ORAL | Status: DC | PRN

## 2018-01-10 MED ORDER — LIDOCAINE 2% (20 MG/ML) 5 ML SYRINGE
INTRAMUSCULAR | Status: DC | PRN
  Administered 2018-01-10: 60 mg via INTRAVENOUS

## 2018-01-10 MED ORDER — CEFAZOLIN SODIUM-DEXTROSE 2-4 GM/100ML-% IV SOLN
2.0000 g | INTRAVENOUS | Status: AC
  Administered 2018-01-10: 2 g via INTRAVENOUS
  Filled 2018-01-10: qty 100

## 2018-01-10 MED ORDER — ONDANSETRON HCL 4 MG/2ML IJ SOLN: 4.0000 mg | Freq: Four times a day (QID) | INTRAMUSCULAR | Status: DC | PRN

## 2018-01-10 MED ORDER — METOCLOPRAMIDE HCL 5 MG PO TABS: 5.0000 mg | ORAL_TABLET | Freq: Three times a day (TID) | ORAL | Status: DC | PRN

## 2018-01-10 MED ORDER — DEXTROSE 5 % IV SOLN
500.0000 mg | Freq: Four times a day (QID) | INTRAVENOUS | Status: DC | PRN
Start: 2018-01-10 — End: 2018-01-12
  Filled 2018-01-10: qty 5

## 2018-01-10 MED ORDER — OXYCODONE HCL 5 MG PO TABS: 5.0000 mg | ORAL_TABLET | Freq: Once | ORAL | Status: DC | PRN

## 2018-01-10 MED ORDER — DOCUSATE SODIUM 100 MG PO CAPS
100.0000 mg | ORAL_CAPSULE | Freq: Two times a day (BID) | ORAL | Status: DC
  Administered 2018-01-10 – 2018-01-12 (×5): 100 mg via ORAL
  Filled 2018-01-10 (×5): qty 1

## 2018-01-10 MED ORDER — FENTANYL CITRATE (PF) 100 MCG/2ML IJ SOLN
25.0000 ug | INTRAMUSCULAR | Status: DC | PRN
  Administered 2018-01-10 (×2): 50 ug via INTRAVENOUS

## 2018-01-10 MED ORDER — PROMETHAZINE HCL 25 MG/ML IJ SOLN: 6.2500 mg | INTRAMUSCULAR | Status: DC | PRN

## 2018-01-10 SURGICAL SUPPLY — 37 items
BLADE SURG 21 STRL SS (BLADE) ×2 IMPLANT
BNDG COHESIVE 6X5 TAN STRL LF (GAUZE/BANDAGES/DRESSINGS) ×2 IMPLANT
BNDG GAUZE ELAST 4 BULKY (GAUZE/BANDAGES/DRESSINGS) ×4 IMPLANT
COVER SURGICAL LIGHT HANDLE (MISCELLANEOUS) ×4 IMPLANT
DRAPE U-SHAPE 47X51 STRL (DRAPES) ×2 IMPLANT
DRESSING PREVENA PLUS CUSTOM (GAUZE/BANDAGES/DRESSINGS) ×1 IMPLANT
DRESSING VERAFLO CLEANSE CC (GAUZE/BANDAGES/DRESSINGS) ×1 IMPLANT
DRSG ADAPTIC 3X8 NADH LF (GAUZE/BANDAGES/DRESSINGS) ×2 IMPLANT
DRSG PREVENA PLUS CUSTOM (GAUZE/BANDAGES/DRESSINGS) ×2
DRSG VERAFLO CLEANSE CC (GAUZE/BANDAGES/DRESSINGS) ×2
DURAPREP 26ML APPLICATOR (WOUND CARE) ×2 IMPLANT
ELECT REM PT RETURN 9FT ADLT (ELECTROSURGICAL)
ELECTRODE REM PT RTRN 9FT ADLT (ELECTROSURGICAL) IMPLANT
GAUZE SPONGE 4X4 12PLY STRL (GAUZE/BANDAGES/DRESSINGS) ×2 IMPLANT
GLOVE BIOGEL PI IND STRL 9 (GLOVE) ×1 IMPLANT
GLOVE BIOGEL PI INDICATOR 9 (GLOVE) ×1
GLOVE SURG ORTHO 9.0 STRL STRW (GLOVE) ×2 IMPLANT
GOWN STRL REUS W/ TWL XL LVL3 (GOWN DISPOSABLE) ×2 IMPLANT
GOWN STRL REUS W/TWL XL LVL3 (GOWN DISPOSABLE) ×2
HANDPIECE INTERPULSE COAX TIP (DISPOSABLE)
IMMOBILIZER KNEE 22 UNIV (SOFTGOODS) ×2 IMPLANT
KIT BASIN OR (CUSTOM PROCEDURE TRAY) ×2 IMPLANT
KIT PREVENA INCISION MGT 13 (CANNISTER) ×2 IMPLANT
KIT TURNOVER KIT B (KITS) ×2 IMPLANT
MANIFOLD NEPTUNE II (INSTRUMENTS) ×2 IMPLANT
NS IRRIG 1000ML POUR BTL (IV SOLUTION) ×2 IMPLANT
PACK ORTHO EXTREMITY (CUSTOM PROCEDURE TRAY) ×2 IMPLANT
PAD ARMBOARD 7.5X6 YLW CONV (MISCELLANEOUS) ×4 IMPLANT
PADDING CAST COTTON 6X4 STRL (CAST SUPPLIES) ×2 IMPLANT
PREVENA INCISION MGT 90 150 (MISCELLANEOUS) ×2 IMPLANT
SET HNDPC FAN SPRY TIP SCT (DISPOSABLE) IMPLANT
STOCKINETTE IMPERVIOUS 9X36 MD (GAUZE/BANDAGES/DRESSINGS) IMPLANT
SWAB COLLECTION DEVICE MRSA (MISCELLANEOUS) ×2 IMPLANT
SWAB CULTURE ESWAB REG 1ML (MISCELLANEOUS) IMPLANT
TOWEL OR 17X26 10 PK STRL BLUE (TOWEL DISPOSABLE) ×2 IMPLANT
TUBE CONNECTING 12X1/4 (SUCTIONS) ×2 IMPLANT
YANKAUER SUCT BULB TIP NO VENT (SUCTIONS) ×2 IMPLANT

## 2018-01-10 NOTE — Anesthesia Procedure Notes (Signed)
Procedure Name: LMA Insertion Date/Time: 01/10/2018 11:06 AM Performed by: Imagene Riches, CRNA Pre-anesthesia Checklist: Patient identified, Emergency Drugs available, Suction available and Patient being monitored Patient Re-evaluated:Patient Re-evaluated prior to induction Oxygen Delivery Method: Circle System Utilized Preoxygenation: Pre-oxygenation with 100% oxygen Induction Type: IV induction LMA: LMA inserted LMA Size: 4.0 Number of attempts: 1 Airway Equipment and Method: Bite block Placement Confirmation: positive ETCO2 Tube secured with: Tape Dental Injury: Teeth and Oropharynx as per pre-operative assessment

## 2018-01-10 NOTE — Transfer of Care (Signed)
Immediate Anesthesia Transfer of Care Note  Patient: Hailey [name]  Procedure(s) Performed: IRRIGATION AND DEBRIDEMENT RIGHT KNEE, APPLY WOUND VAC (Right )  Patient Location: PACU  Anesthesia Type:General  Level of Consciousness: awake and patient cooperative  Airway & Oxygen Therapy: Patient Spontanous Breathing  Post-op Assessment: Report given to RN and Post -op Vital signs reviewed and stable  Post vital signs: Reviewed and stable  Last Vitals:  Vitals Value Taken Time  BP 161/95 01/10/2018 12:47 PM  Temp    Pulse 65 01/10/2018 12:47 PM  Resp 13 01/10/2018 12:47 PM  SpO2 100 % 01/10/2018 12:47 PM  Vitals shown include unvalidated device data.  Last Pain:  Vitals:   01/10/18 0855  TempSrc:   PainSc: 4       Patients Stated Pain Goal: 2 (48/27/07 8675)  Complications: No apparent anesthesia complications

## 2018-01-10 NOTE — Evaluation (Signed)
Physical Therapy Evaluation Patient Details Name: Hailey [name] MRN: [id number] DOB: [birth year] Today's Date: 01/10/2018   History of Present Illness  83 year old woman who presents with a massive hematoma with skin loss and soft tissue destruction over her patella secondary to a fall.  s/p surgical removal of necrotic Right Knee ulcer 01/10/18  Clinical Impression  PTA pt was able to ambulated household distances with RW and was independent in ADLs, husband assist with iADLs. Pt currently limited in her safe mobility by increased R knee pain and immobility. Pt currently min A for bed mobility, and transfers and min guard for ambulation of 3 ft with RW. PT recommends HHPT at d/c. PT will follow acutely to insure pt able to ambulate 100 feet and ascend 2 steps to safely enter her house at d/c.     Follow Up Recommendations Home health PT;Supervision for mobility/OOB    Equipment Recommendations  None recommended by PT    Recommendations for Other Services       Precautions / Restrictions Precautions Precautions: Fall Required Braces or Orthoses: Knee Immobilizer - Right Knee Immobilizer - Right: On when out of bed or walking Restrictions Weight Bearing Restrictions: Yes RLE Weight Bearing: Weight bearing as tolerated      Mobility  Bed Mobility Overal bed mobility: Needs Assistance Bed Mobility: Supine to Sit     Supine to sit: Min assist     General bed mobility comments: minA for management of LE to EoB  Transfers Overall transfer level: Needs assistance Equipment used: Rolling walker (2 wheeled) Transfers: Sit to/from Stand Sit to Stand: Min assist         General transfer comment: minA for power up and steadying vc for hand placement and R LE positioning for power up  Ambulation/Gait Ambulation/Gait assistance: Min guard Ambulation Distance (Feet): 3 Feet Assistive device: Rolling walker (2 wheeled) Gait Pattern/deviations: Step-to pattern;Decreased stance  time - right;Decreased step length - left;Decreased weight shift to right;Antalgic;Trunk flexed Gait velocity: Decreased  Gait velocity interpretation: <1.31 ft/sec, indicative of household ambulator General Gait Details: slow, antalgic gait with decreased weightshift to R, suggested longer distance of ambulation however pt refused secondary to pain      Balance Overall balance assessment: Needs assistance Sitting-balance support: No upper extremity supported;Feet supported Sitting balance-Leahy Scale: Good     Standing balance support: Bilateral upper extremity supported;During functional activity;No upper extremity supported Standing balance-Leahy Scale: Fair                               Pertinent Vitals/Pain Pain Assessment: 0-10 Pain Score: 9  Pain Location: R knee  Pain Descriptors / Indicators: Grimacing;Guarding Pain Intervention(s): Limited activity within patient's tolerance;Monitored during session;Premedicated before session    Home Living Family/patient expects to be discharged to:: Private residence Living Arrangements: Spouse/significant other Available Help at Discharge: Family;Available 24 hours/day Type of Home: House Home Access: Stairs to enter Entrance Stairs-Rails: Left Entrance Stairs-Number of Steps: 2 Home Layout: One level Home Equipment: Walker - 2 wheels;Cane - single point;Bedside commode;Shower seat;Grab bars - tub/shower;Hand held shower head      Prior Function Level of Independence: Needs assistance   Gait / Transfers Assistance Needed: household ambulation   ADL's / Homemaking Assistance Needed: assist with putting on sleeve over wound for showering           Extremity/Trunk Assessment   Upper Extremity Assessment Upper Extremity Assessment: Overall WFL for tasks assessed  Lower Extremity Assessment Lower Extremity Assessment: RLE deficits/detail RLE Deficits / Details: R knee immobilized post surgery RLE: Unable  to fully assess due to immobilization;Unable to fully assess due to pain       Communication   Communication: No difficulties  Cognition Arousal/Alertness: Awake/alert Behavior During Therapy: WFL for tasks assessed/performed Overall Cognitive Status: Within Functional Limits for tasks assessed                                        General Comments      Exercises Total Joint Exercises Ankle Circles/Pumps: AROM;20 reps;Both;Seated   Assessment/Plan    PT Assessment Patient needs continued PT services  PT Problem List Decreased strength;Decreased range of motion;Decreased activity tolerance;Decreased balance;Decreased mobility;Pain       PT Treatment Interventions DME instruction;Gait training;Stair training;Functional mobility training;Therapeutic activities;Therapeutic exercise;Balance training;Patient/family education    PT Goals (Current goals can be found in the Care Plan section)  Acute Rehab PT Goals Patient Stated Goal: to get better  PT Goal Formulation: With patient Time For Goal Achievement: 01/24/18 Potential to Achieve Goals: Good    Frequency Min 3X/week    AM-PAC PT "6 Clicks" Daily Activity  Outcome Measure Difficulty turning over in bed (including adjusting bedclothes, sheets and blankets)?: A Lot Difficulty moving from lying on back to sitting on the side of the bed? : Unable Difficulty sitting down on and standing up from a chair with arms (e.g., wheelchair, bedside commode, etc,.)?: Unable Help needed moving to and from a bed to chair (including a wheelchair)?: A Little Help needed walking in hospital room?: A Lot Help needed climbing 3-5 steps with a railing? : A Lot 6 Click Score: 11    End of Session Equipment Utilized During Treatment: Gait belt Activity Tolerance: Patient limited by pain Patient left: in chair;with call bell/phone within reach;with family/visitor present Nurse Communication: Mobility status PT Visit  Diagnosis: Unsteadiness on feet (R26.81);Other abnormalities of gait and mobility (R26.89);Muscle weakness (generalized) (M62.81);Pain;Difficulty in walking, not elsewhere classified (R26.2) Pain - Right/Left: Right Pain - part of body: Knee    Time: 0932-3557 PT Time Calculation (min) (ACUTE ONLY): 34 min   Charges:   PT Evaluation $PT Eval Moderate Complexity: 1 Mod PT Treatments $Therapeutic Activity: 8-22 mins   PT G Codes:        Devontre Siedschlag B. Migdalia Dk PT, DPT Acute Rehabilitation  [phone] Pager [phone]    [place] 01/10/2018, 5:05 PM

## 2018-01-10 NOTE — H&P (Signed)
Hailey [name] is an 83 y.o. female.   Chief Complaint: Necrotic ulcer right knee. HPI: Patient is an 83 year old woman who presents with a massive hematoma with skin loss and soft tissue destruction over her patella secondary to a fall.  Patient has undergone good conservative therapy and presents at this time with worsening of her symptoms.  Traumatic hematoma over her right patella secondary to therapy with Eliquis and Plavix with a history of a cardiac stent atrial fibrillation.      Past Medical History:  Diagnosis Date  . Acute blood loss anemia 12/25/2017  . Allergic rhinitis   . Anxiety   . Arthritis    "my whole spine" (07/01/2017)  . Atrial fibrillation ([place])   . Bradycardia with 41-50 beats per minute 12/27/2017  . Cellulitis of leg, right with large prepatella hematoma and open wounds 12/26/2017  . CKD (chronic kidney disease) stage 2, GFR 60-89 ml/min   . Coronary artery disease    10/18 PCI/DES to p/m LCx with cutting balloon to mLcx  . Diverticulosis of colon   . GERD (gastroesophageal reflux disease)   . Hip bursitis 2010   Dr Para March, Post op seroma  . History of colon polyps   . HTN (hypertension)   . IBS (irritable bowel syndrome)    constipation predominant - Dr Earlean Shawl  . Lichen sclerosus   . Osteopenia 11/2016   T score -2.0 FRAX 15%/4.3%  . PAC (premature atrial contraction)    Symptomatiic  . Right bundle branch block (RBBB) on electrocardiogram (ECG) 12/27/2017  . Scoliosis   . SVT (supraventricular tachycardia) ([place])    brief history    Past Surgical History:  Procedure Laterality Date  . ANTERIOR AND POSTERIOR VAGINAL REPAIR  01/2002   Archie Endo 01/23/2011  . APPENDECTOMY  1948  . CARDIAC CATHETERIZATION  06/26/2017  . CORONARY ANGIOPLASTY WITH STENT PLACEMENT  07/01/2017  . CORONARY ATHERECTOMY N/A 07/01/2017   Procedure: CORONARY ATHERECTOMY;  Surgeon: Belva Crome, MD;  Location: [place] [facility];  Service: Cardiovascular;  Laterality: N/A;  .  CORONARY STENT INTERVENTION N/A 07/01/2017   Procedure: CORONARY STENT INTERVENTION;  Surgeon: Belva Crome, MD;  Location: [place] [facility];  Service: Cardiovascular;  Laterality: N/A;  . HAMMER TOE SURGERY    . HEMORRHOID BANDING    . HIP SURGERY Left 04/2009   hip examination under anesthesia followed by greater trochanteric bursectomy; iliotibial band tenotomy/notes 01/20/2011  . KNEE BURSECTOMY Right 04/2009   Archie Endo 01/09/2011  . LEFT HEART CATH AND CORONARY ANGIOGRAPHY N/A 06/26/2017   Procedure: LEFT HEART CATH AND CORONARY ANGIOGRAPHY;  Surgeon: Belva Crome, MD;  Location: [place] [facility];  Service: Cardiovascular;  Laterality: N/A;  . PUBOVAGINAL SLING  01/2002   Archie Endo 01/23/2011  . REDUCTION MAMMAPLASTY    . TEMPORARY PACEMAKER N/A 07/01/2017   Procedure: TEMPORARY PACEMAKER;  Surgeon: Belva Crome, MD;  Location: [place] [facility];  Service: Cardiovascular;  Laterality: N/A;  . VAGINAL HYSTERECTOMY  01/2002   Vaginal hysterectomy, bilateral salpingo-oophorectomy/notes 01/23/2011    Family History  Problem Relation Age of Onset  . Colon cancer Mother   . Cancer Father        Prostate  . Diabetes Father   . Cancer Brother        Prostate, pancreas  . Cancer Son        Stomach  . Heart attack Neg Hx   . Stroke Neg Hx    Social History:  reports that she quit smoking about 38 years ago. Her smoking use included cigarettes. She has a 7.00 pack-year smoking history. She has never used smokeless tobacco. She reports that she drinks about 12.6 oz of alcohol per week. She reports that she does not use drugs.  Allergies:  Allergies  Allergen Reactions  . Macrobid [employer] Macro] Other (See Comments)    Nausea, stomach cramps, fatigue , headache.  . Meloxicam Other (See Comments)    Jittery and headache  . Digoxin And Related     headaches  . Sulfamethoxazole-Trimethoprim Nausea Only    No medications prior to admission.    No results found  for this or any previous visit (from the past 48 hour(s)). No results found.  Review of Systems  All other systems reviewed and are negative.   There were no vitals taken for this visit. Physical Exam  Patient is alert, oriented, no adenopathy, well-dressed, normal affect, normal respiratory effort. Patient has a good dorsalis pedis pulse.  She has significant venous stasis swelling in the right lower extremity with brawny skin color changes.  She has a large necrotic ulcer with hematoma over the patella the ulcer is 5 x 6 cm and 2 cm deep to the patella but there is exposed within the wound bed hematoma.  There is no odor or cellulitis no abscess.    Assessment/Plan 1. Ulcer of knee, right, with necrosis of bone ([place])   2. Traumatic hematoma of knee, right, sequela     Plan: Discussed with the patient we need to proceed aggressively with surgical intervention for limb salvage.  We will plan for surgery tomorrow with debridement application of an installation wound VAC followed by surgery on Wednesday with repeat debridement and repeat the installation wound VAC with anticipated split-thickness skin graft in a week next Friday.  Patient will need to be hospitalized during this time with IV antibiotics.  Discussed that without aggressive wound care patient is at risk of loss of limb.      Newt Minion, MD 01/10/2018, 6:39 AM

## 2018-01-10 NOTE — Anesthesia Preprocedure Evaluation (Signed)
Anesthesia Evaluation  Patient identified by MRN, date of birth, ID band Patient awake    Reviewed: Allergy & Precautions, NPO status , Patient's Chart, lab work & pertinent test results  Airway Mallampati: II  TM Distance: >3 FB Neck ROM: Full    Dental no notable dental hx.    Pulmonary neg pulmonary ROS, former smoker,    Pulmonary exam normal breath sounds clear to auscultation       Cardiovascular hypertension, + CAD and + Cardiac Stents  Normal cardiovascular exam Rhythm:Regular Rate:Normal     Neuro/Psych negative neurological ROS  negative psych ROS   GI/Hepatic negative GI ROS, Neg liver ROS,   Endo/Other  negative endocrine ROS  Renal/GU Renal InsufficiencyRenal disease  negative genitourinary   Musculoskeletal negative musculoskeletal ROS (+)   Abdominal   Peds negative pediatric ROS (+)  Hematology  (+) anemia ,   Anesthesia Other Findings   Reproductive/Obstetrics negative OB ROS                             Anesthesia Physical Anesthesia Plan  ASA: III  Anesthesia Plan: General   Post-op Pain Management:    Induction: Intravenous  PONV Risk Score and Plan: 3 and Ondansetron and Dexamethasone  Airway Management Planned: LMA  Additional Equipment:   Intra-op Plan:   Post-operative Plan: Extubation in OR  Informed Consent: I have reviewed the patients History and Physical, chart, labs and discussed the procedure including the risks, benefits and alternatives for the proposed anesthesia with the patient or authorized representative who has indicated his/her understanding and acceptance.   Dental advisory given  Plan Discussed with: CRNA and Surgeon  Anesthesia Plan Comments:         Anesthesia Quick Evaluation

## 2018-01-10 NOTE — Op Note (Signed)
01/10/2018  11:55 AM  PATIENT:  Hailey [name]    PRE-OPERATIVE DIAGNOSIS:  Necrotic Ulcer Right Knee  POST-OPERATIVE DIAGNOSIS:  Same  PROCEDURE:  IRRIGATION AND DEBRIDEMENT RIGHT KNEE, APPLY WOUND VAC Excision of skin and soft tissue muscle and fascia wound dimensions 7 x 14 cm. Local tissue rearrangement to close the wound 7 x 14 cm. Application of Praveena incisional wound VAC with knee immobilizer. SURGEON:  Newt Minion, MD  PHYSICIAN ASSISTANT:None ANESTHESIA:   General  PREOPERATIVE INDICATIONS:  Hailey [name] is a  83 y.o. female with a diagnosis of Necrotic Ulcer Right Knee who failed conservative measures and elected for surgical management.    The risks benefits and alternatives were discussed with the patient preoperatively including but not limited to the risks of infection, bleeding, nerve injury, cardiopulmonary complications, the need for revision surgery, among others, and the patient was willing to proceed.  OPERATIVE IMPLANTS: Praveena wound VAC.  @ENCIMAGES @  OPERATIVE FINDINGS: Large hematoma did not communicate with the joint.  OPERATIVE PROCEDURE: Patient was brought the operating room and underwent a general anesthetic.  After adequate levels of anesthesia were obtained patient's right lower extremity was prepped using DuraPrep draped in the sterile field a timeout was called.  Elliptical incision was made around the ulcerative necrotic tissue.  The wound was 7 x 14 cm.  Using a rondure and a 10 blade knife a Cobb elevator the nonviable tissue was  sharply excised.  The wound was irrigated with pulsatile lavage.  Electrocautery was used for hemostasis.  Local tissue rearrangement was used to close the wound 7 x 14 cm.  A Praveena incisional wound VAC was applied this was then covered with a web roll and Covan dressing.  There was a good suction fit patient was placed in a knee immobilizer extubated taken to the PACU in stable condition.   DISCHARGE  PLANNING:  Antibiotic duration: 24 hours  Weightbearing: Weightbearing as tolerated with the knee immobilizer in place  Pain medication: Ordered  Dressing care/ Wound VAC: Continue incisional wound VAC for 1 week  Ambulatory devices: Walker  Discharge to: Home when safe with ambulation.  Follow-up: In the office 1 week post operative.

## 2018-01-11 NOTE — Progress Notes (Signed)
Patient ID: Hailey [name], female   DOB: March 13, 1935, 83 y.o.   MRN: [id number] Patient is sitting in a chair the knee immobilizer is not on.  Patient states she had it taken off last night because she states that it was hot.  Orders were written for the knee immobilizer to be worn at all times and not be removed.  I am concerned patient may have flexed her knee and cause dehiscence of the wound.  The knee immobilizer was reapplied she will work with physical therapy anticipate we could discharge tomorrow.  Approximately 50 cc in the wound VAC canister.  Will transition to the [facility] plus discharge.

## 2018-01-11 NOTE — Progress Notes (Signed)
Orthopedic Tech Progress Note Patient Details:  Hailey [name] [birth year] [id number] [employer] was called and brace was ordered. Patient ID: Hailey [name], female   DOB: 1935-06-20, 83 y.o.   MRN: [id number]   [name] [facility] 01/11/2018, 4:51 PM

## 2018-01-11 NOTE — Discharge Instructions (Signed)

## 2018-01-11 NOTE — Progress Notes (Addendum)
Physical Therapy Treatment Patient Details Name: Hailey [name] MRN: [id number] DOB: [birth year] Today's Date: 01/11/2018    History of Present Illness Pt is an 83 year old woman who presents with a massive hematoma with skin loss and soft tissue destruction over her patella secondary to a fall.  s/p surgical removal of necrotic Right Knee ulcer 01/10/18    PT Comments    Pt making steady progress with functional mobility. Pt tolerated ambulating a further distance this session, however was limited secondary to dizziness. Pt would continue to benefit from skilled physical therapy services at this time while admitted and after d/c to address the below listed limitations in order to improve overall safety and independence with functional mobility.    Follow Up Recommendations  Home health PT;Supervision for mobility/OOB     Equipment Recommendations  None recommended by PT    Recommendations for Other Services       Precautions / Restrictions Precautions Precautions: Fall Required Braces or Orthoses: Knee Immobilizer - Right Knee Immobilizer - Right: On when out of bed or walking Restrictions Weight Bearing Restrictions: Yes RLE Weight Bearing: Weight bearing as tolerated    Mobility  Bed Mobility               General bed mobility comments: pt OOB in recliner chair upon arrival  Transfers Overall transfer level: Needs assistance Equipment used: Rolling walker (2 wheeled) Transfers: Sit to/from Stand Sit to Stand: Min guard         General transfer comment: min guard for safety, good technique  Ambulation/Gait Ambulation/Gait assistance: Min guard Ambulation Distance (Feet): 20 Feet(20' x2 with standing rest break secondary to +dizziness) Assistive device: Rolling walker (2 wheeled) Gait Pattern/deviations: Step-to pattern;Decreased stance time - right;Decreased step length - left;Decreased weight shift to right;Antalgic;Trunk flexed Gait velocity: Decreased   Gait velocity interpretation: <1.31 ft/sec, indicative of household ambulator General Gait Details: slow, cautious gait, steady with RW and min guard; pt limited secondary to dizziness   Stairs             Wheelchair Mobility    Modified Rankin (Stroke Patients Only)       Balance Overall balance assessment: Needs assistance Sitting-balance support: No upper extremity supported;Feet supported Sitting balance-Leahy Scale: Good     Standing balance support: Bilateral upper extremity supported;During functional activity;No upper extremity supported Standing balance-Leahy Scale: Poor Standing balance comment: reliant on bilateral UEs on RW                            Cognition Arousal/Alertness: Awake/alert Behavior During Therapy: WFL for tasks assessed/performed Overall Cognitive Status: Within Functional Limits for tasks assessed                                        Exercises Total Joint Exercises Ankle Circles/Pumps: AROM;Right;20 reps;Seated    General Comments        Pertinent Vitals/Pain Pain Assessment: Faces Faces Pain Scale: Hurts a little bit Pain Location: R knee  Pain Descriptors / Indicators: Guarding Pain Intervention(s): Monitored during session;Repositioned    Home Living                      Prior Function            PT Goals (current goals can now be found in the care  plan section) Acute Rehab PT Goals PT Goal Formulation: With patient Time For Goal Achievement: 01/24/18 Potential to Achieve Goals: Good Progress towards PT goals: Progressing toward goals    Frequency    Min 3X/week      PT Plan Current plan remains appropriate    Co-evaluation              AM-PAC PT "6 Clicks" Daily Activity  Outcome Measure  Difficulty turning over in bed (including adjusting bedclothes, sheets and blankets)?: A Little Difficulty moving from lying on back to sitting on the side of the bed? :  Unable Difficulty sitting down on and standing up from a chair with arms (e.g., wheelchair, bedside commode, etc,.)?: Unable Help needed moving to and from a bed to chair (including a wheelchair)?: A Little Help needed walking in hospital room?: A Little Help needed climbing 3-5 steps with a railing? : A Lot 6 Click Score: 13    End of Session Equipment Utilized During Treatment: Gait belt;Right knee immobilizer Activity Tolerance: Patient limited by pain;Other (comment)(pt limited secondary to dizziness) Patient left: in chair;with call bell/phone within reach Nurse Communication: Mobility status PT Visit Diagnosis: Unsteadiness on feet (R26.81);Other abnormalities of gait and mobility (R26.89);Muscle weakness (generalized) (M62.81);Pain;Difficulty in walking, not elsewhere classified (R26.2) Pain - Right/Left: Right Pain - part of body: Knee     Time: 9747-1855 PT Time Calculation (min) (ACUTE ONLY): 15 min  Charges:  $Gait Training: 8-22 mins                    G Codes:       [place], [place], [place] 01/11/2018, 10:18 AM

## 2018-01-12 NOTE — Discharge Summary (Signed)
Discharge Diagnoses:  Active Problems:   Ulcer of right knee ([place])   Traumatic hematoma of right knee   Surgeries: Procedure(s): IRRIGATION AND DEBRIDEMENT RIGHT KNEE, APPLY WOUND VAC on 01/10/2018    Consultants:   Discharged Condition: Improved  Hospital Course: Hailey [name] is an 83 y.o. female who was admitted 01/10/2018 with a chief complaint of ulcer right knee, with a final diagnosis of Necrotic Ulcer Right Knee.  Patient was brought to the operating room on 01/10/2018 and underwent Procedure(s): IRRIGATION AND DEBRIDEMENT RIGHT KNEE, APPLY WOUND VAC.    Patient was given perioperative antibiotics:  Anti-infectives (From admission, onward)   Start     Dose/Rate Route Frequency Ordered Stop   01/10/18 1700  ceFAZolin (ANCEF) IVPB 1 g/50 mL premix     1 g 100 mL/hr over 30 Minutes Intravenous Every 6 hours 01/10/18 1513 01/11/18 0521   01/10/18 0845  ceFAZolin (ANCEF) IVPB 2g/100 mL premix     2 g 200 mL/hr over 30 Minutes Intravenous To [facility] 01/10/18 0835 01/10/18 1109    .  Patient was given sequential compression devices, early ambulation, and aspirin for DVT prophylaxis.  Recent vital signs:  Patient Vitals for the past 24 hrs:  BP Temp Temp src Pulse Resp SpO2  01/12/18 0457 131/83 (!) 97.4 F (36.3 C) Oral 61 18 97 %  01/11/18 2057 136/76 98.5 F (36.9 C) Oral 63 18 98 %  01/11/18 1806 113/84 - - - - -  .  Recent laboratory studies: No results found.  Discharge Medications:   Allergies as of 01/12/2018      Reactions   Macrobid [nitrofurantoin Monohyd Macro] Other (See Comments)   Nausea, stomach cramps, fatigue , headache.   Meloxicam Other (See Comments)   Jittery and headache   Digoxin And Related    headaches   Sulfamethoxazole-trimethoprim Nausea Only      Medication List    TAKE these medications   apixaban 5 MG Tabs tablet Commonly known as:  ELIQUIS Take 1 tablet (5 mg total) by mouth 2 (two) times daily.   atorvastatin 80 MG  tablet Commonly known as:  LIPITOR TAKE 1 TABLET AT 6PM.   DULoxetine 60 MG capsule Commonly known as:  CYMBALTA TAKE (1) CAPSULE DAILY.   ketotifen 0.025 % ophthalmic solution Commonly known as:  ZADITOR Place 1 drop into both eyes daily.   LORazepam 1 MG tablet Commonly known as:  ATIVAN Take 1 mg by mouth at bedtime.   metoprolol succinate 25 MG 24 hr tablet Commonly known as:  TOPROL-XL Take 3 tablets (75 mg total) by mouth daily. Take with or immediately following a meal.   nitroGLYCERIN 0.4 MG SL tablet Commonly known as:  NITROSTAT Place 0.4 mg under the tongue every 5 (five) minutes as needed for chest pain (Call 911 at 3rd dose within 15 minutes.).   pantoprazole 40 MG tablet Commonly known as:  PROTONIX TAKE 1 TABLET BY MOUTH DAILY.   sodium chloride 0.65 % Soln nasal spray Commonly known as:  OCEAN Place 1 spray into both nostrils as needed for congestion.   TYLENOL 500 MG tablet Generic drug:  acetaminophen Take 1,000 mg by mouth every 6 (six) hours as needed (Coated or capsules).       Diagnostic Studies: Dg Chest 2 View  Result Date: 12/24/2017 CLINICAL DATA:  Syncope.  Preop for ruptured quadriceps. EXAM: CHEST - 2 VIEW COMPARISON:  07/03/2014 FINDINGS: Stable borderline heart size and mild aortic tortuosity. There  is no edema, consolidation, effusion, or pneumothorax. Spondylosis. IMPRESSION: No evidence of active disease. Electronically Signed   By: Monte Fantasia M.D.   On: 12/24/2017 13:02   Dg Tibia/fibula Right  Result Date: 12/25/2017 CLINICAL DATA:  Right leg pain after fall 3 days ago. EXAM: RIGHT TIBIA AND FIBULA - 2 VIEW COMPARISON:  None. FINDINGS: There is no evidence of fracture or other focal bone lesions. Soft tissues are unremarkable. IMPRESSION: Normal right tibia and fibula. Electronically Signed   By: Marijo Conception, M.D.   On: 12/25/2017 08:47   Ct Knee Right Wo Contrast  Result Date: 12/23/2017 CLINICAL DATA:  Status post fall,  with injury to the right knee. Initial encounter. EXAM: CT OF THE RIGHT KNEE WITHOUT CONTRAST TECHNIQUE: Multidetector CT imaging of the right knee was performed according to the standard protocol. Multiplanar CT image reconstructions were also generated. COMPARISON:  Right knee radiographs performed earlier today at 7:42 p.m. FINDINGS: Bones/Joint/Cartilage There is no evidence of fracture or dislocation. Visualized joint spaces are grossly preserved. No knee joint effusion is seen. The cartilage is not well assessed on CT. Ligaments The anterior and posterior cruciate ligaments appear grossly intact. The medial collateral ligament and lateral collateral ligament complex are grossly unremarkable. Muscles and Tendons There appears to be partial disruption of the distal aspect of the quadriceps, with a large 8.9 x 4.6 x 7.2 cm hematoma anterior to the patella, with fluid-fluid levels. Remaining musculature is grossly unremarkable in appearance. Soft tissues Soft tissue injury is noted tracking about the knee. Scattered vascular calcifications are seen. IMPRESSION: 1. No evidence of fracture or dislocation. 2. Partial disruption of the distal aspect of the quadriceps muscle, with a large 8.9 cm hematoma anterior to the patella, with fluid-fluid levels. Surrounding soft tissue injury noted tracking about the knee. 3. Scattered vascular calcifications. Electronically Signed   By: Garald Balding M.D.   On: 12/23/2017 00:47   Mr Knee Right Wo Contrast  Result Date: 12/25/2017 CLINICAL DATA:  83 year old female status post fall presents with pain and swelling EXAM: MRI OF THE RIGHT KNEE WITHOUT CONTRAST TECHNIQUE: Multiplanar, multisequence MR imaging of the knee was performed. No intravenous contrast was administered. COMPARISON:  None. FINDINGS: MENISCI Medial meniscus: Flap tear of the anterior horn involving the free edge and inferior articular surface, series 7, images 26 and 27 with secondary horizontal  component. Free edge tear of the body of the medial meniscus, series 6/18. Inferior articular surfacing tear also noted of the posterior horn, series 7/27. Lateral meniscus: Free edge tear of the anterior horn and body of the lateral meniscus, series 6/16 and 17 and series 7/15. LIGAMENTS Cruciates:  Full-thickness tear of the mid ACL.  Intact PCL. Collaterals:  Intact CARTILAGE Patellofemoral: The grade 2 and grade 3 chondral thinning and fissuring along the lower pole of the patella overlying the median ridge and lateral patellar facet, series 3/33 intact trochlear cartilage. Medial: Mild partial-thickness cartilage loss of the medial femorotibial compartment. Lateral:  No chondral defect Joint:  No joint effusion Popliteal Fossa: Trace fluid in the expected location of a popliteal cyst. Intact popliteus. Extensor Mechanism: Intact quadriceps and patellar tendons. Grade 2 sprain MPFL. Bones: No fracture, suspicious osseous lesion nor focal marrow abnormality. Other: Prepatellar hematoma measuring at least 8.9 x 2 x 7.7 cm. IMPRESSION: 1. Full-thickness tear of the ACL. 2. Intact quadriceps and patellar tendons. Grade 2 sprain of the medial patellofemoral ligament. 3. Flap tear of the anterior horn of the medial  meniscus with inferior articular surfacing tear of the posterior horn and free edge tear of the body. 4. Free edge tear of the anterior horn and body of the lateral meniscus. 5. Mild chondrosis of the patellofemoral and medial femorotibial compartments. 6. Prepatellar hematoma approximately measuring 8.9 x 2 x 7.7 cm. Electronically Signed   By: Ashley Royalty M.D.   On: 12/25/2017 18:15   Dg Knee Complete 4 Views Right  Result Date: 12/22/2017 CLINICAL DATA:  Fall with bleeding and swelling EXAM: RIGHT KNEE - COMPLETE 4+ VIEW COMPARISON:  11/27/2013 FINDINGS: No acute displaced fracture or malalignment is seen. Joint space calcifications. No large knee fusion. Large amount of prepatellar soft tissue  swelling. Mild degenerative changes of the medial joint space. IMPRESSION: Large amount of prepatellar soft tissue swelling. No acute osseous abnormality. Chondrocalcinosis. Electronically Signed   By: Donavan Foil M.D.   On: 12/22/2017 19:59   Dg Hip Unilat With Pelvis 2-3 Views Right  Result Date: 12/25/2017 CLINICAL DATA:  Right leg pain after fall 3 days ago. EXAM: DG HIP (WITH OR WITHOUT PELVIS) 2-3V RIGHT COMPARISON:  None. FINDINGS: There is no evidence of hip fracture or dislocation. There is no evidence of arthropathy or other focal bone abnormality. IMPRESSION: Normal right hip. Electronically Signed   By: Marijo Conception, M.D.   On: 12/25/2017 08:49    Patient benefited maximally from their hospital stay and there were no complications.     Disposition: Discharge disposition: 01-Home or Self Care      Discharge Instructions    Call MD / Call 911   Complete by:  As directed    If you experience chest pain or shortness of breath, CALL 911 and be transported to the hospital emergency room.  If you develope a fever above 101 F, pus (white drainage) or increased drainage or redness at the wound, or calf pain, call your surgeon's office.   Constipation Prevention   Complete by:  As directed    Drink plenty of fluids.  Prune juice may be helpful.  You may use a stool softener, such as Colace (over the counter) 100 mg twice a day.  Use MiraLax (over the counter) for constipation as needed.   Diet - low sodium heart healthy   Complete by:  As directed    Increase activity slowly as tolerated   Complete by:  As directed    Negative Pressure Wound Therapy - Incisional   Complete by:  As directed    Attach the wound VAC to the portable Prevena pump, plug unit in to keep it charged, if the Prevena is not in the room, call the OR for the Prevena Plus pump     Follow-up Information    Newt Minion, MD In 1 week.   Specialty:  Orthopedic Surgery Contact information: [place] [address] [phone]            Signed: Newt Minion 01/12/2018, 3:06 PM

## 2018-01-12 NOTE — Progress Notes (Signed)
Physical Therapy Treatment Patient Details Name: Hailey [name] MRN: [id number] DOB: [birth year] Today's Date: 01/12/2018    History of Present Illness   Pt is an 83 year old woman who presents with a massive hematoma with skin loss and soft tissue destruction over her patella secondary to a fall.  s/p surgical removal of necrotic Right Knee ulcer 01/10/18    PT Comments    Today's session focused on gait and stair training/education.  Pt ambulated twice as far today but still needed a seated rest break half way due to dizziness.  Pt felt normal after pursed lip breathing and progressed to 2x stair trials.  She was Min guard for safety with stairs/gait w/o needing VC's for technique.  Reviewed knee precautions.  Dizziness limits the pt more than pain so future sessions should emphasize activity tolerance.     Follow Up Recommendations  Home health PT;Supervision for mobility/OOB     Equipment Recommendations  None recommended by PT    Recommendations for Other Services       Precautions / Restrictions Precautions Precautions: Fall Required Braces or Orthoses: Knee Immobilizer - Right Knee Immobilizer - Right: On when out of bed or walking Restrictions Weight Bearing Restrictions: Yes RLE Weight Bearing: Weight bearing as tolerated Other Position/Activity Restrictions: Per Kirstin Shepperson, PA, ok to perform ROM in R knee and pt is to be WBAT.     Mobility  Bed Mobility Overal bed mobility: Needs Assistance             General bed mobility comments: pt OOB in recliner chair upon arrival  Transfers Overall transfer level: Needs assistance Equipment used: Rolling walker (2 wheeled) Transfers: Sit to/from Stand Sit to Stand: Min guard         General transfer comment: min guard for safety, good technique  Ambulation/Gait Ambulation/Gait assistance: Min guard Ambulation Distance (Feet): 45 Feet(x2 trials with seated rest break halfway) Assistive device: Rolling  walker (2 wheeled) Gait Pattern/deviations: Step-to pattern;Decreased stance time - right;Decreased step length - left;Decreased weight shift to right;Antalgic;Trunk flexed Gait velocity: Decreased    General Gait Details: slow, cautious gait, steady with RW and min guard; pt limited secondary to dizziness, needed seated rest break halfway.     Stairs Stairs: Yes Stairs assistance: Min guard Stair Management: One rail Right Number of Stairs: 2(2x trials.) General stair comments: Pt was Min guard for safety, she already knew and demonstrated proper technique.   Wheelchair Mobility    Modified Rankin (Stroke Patients Only)       Balance Overall balance assessment: Needs assistance Sitting-balance support: No upper extremity supported;Feet supported Sitting balance-Leahy Scale: Good     Standing balance support: Bilateral upper extremity supported;During functional activity;No upper extremity supported   Standing balance comment: reliant on bilateral UEs on RW                            Cognition Arousal/Alertness: Awake/alert Behavior During Therapy: WFL for tasks assessed/performed Overall Cognitive Status: Within Functional Limits for tasks assessed                                        Exercises Total Joint Exercises Hip ABduction/ADduction: AROM;10 reps;Right;Seated Straight Leg Raises: AROM;10 reps;Right;Seated    General Comments  Terri Skains, Student PTA participated in this treatment.      Pertinent Vitals/Pain  Pain Assessment: 0-10 Pain Score: 2  Pain Location: R knee  Pain Descriptors / Indicators: Guarding Pain Intervention(s): Limited activity within patient's tolerance;Repositioned;Monitored during session    Home Living                      Prior Function            PT Goals (current goals can now be found in the care plan section) Acute Rehab PT Goals Patient Stated Goal: to get better  PT Goal  Formulation: With patient Time For Goal Achievement: 01/24/18 Potential to Achieve Goals: Good Progress towards PT goals: Progressing toward goals    Frequency    Min 3X/week      PT Plan Current plan remains appropriate    Co-evaluation              AM-PAC PT "6 Clicks" Daily Activity  Outcome Measure  Difficulty turning over in bed (including adjusting bedclothes, sheets and blankets)?: A Little Difficulty moving from lying on back to sitting on the side of the bed? : Unable Difficulty sitting down on and standing up from a chair with arms (e.g., wheelchair, bedside commode, etc,.)?: Unable Help needed moving to and from a bed to chair (including a wheelchair)?: A Little Help needed walking in hospital room?: A Little Help needed climbing 3-5 steps with a railing? : A Lot 6 Click Score: 13    End of Session Equipment Utilized During Treatment: Gait belt;Right knee immobilizer Activity Tolerance: Other (comment) Patient left: in chair;with call bell/phone within reach Nurse Communication: Mobility status PT Visit Diagnosis: Unsteadiness on feet (R26.81);Other abnormalities of gait and mobility (R26.89);Muscle weakness (generalized) (M62.81);Pain;Difficulty in walking, not elsewhere classified (R26.2) Pain - Right/Left: Right Pain - part of body: Knee     Time: 5883-2549 PT Time Calculation (min) (ACUTE ONLY): 16 min  Charges:  $Gait Training: 8-22 mins                    G Codes:      Roney Marion, PT  Acute Rehabilitation Services Pager [phone] Office [place] 01/12/2018, 9:36 AM

## 2018-01-12 NOTE — Progress Notes (Signed)
Switched to prevena plus, provided discharge education/instructions, all questions and concerns addressed, Pt discharged home with belongings accompanied by husband.

## 2018-01-12 NOTE — Care Management Note (Signed)
Case Management Note  Patient Details  Name: Hailey [name] MRN: [id number] Date of Birth: June 07, 1935  Subjective/Objective:                 Spoke with patient. She would like to use [facility]. Referral placed to [facility]. No other home needs.    Action/Plan:   Expected Discharge Date:  01/12/18               Expected Discharge Plan:  [place]  In-House Referral:     Discharge planning Services  CM Consult  Post Acute Care Choice:  Home Health Choice offered to:     DME Arranged:    DME Agency:     HH Arranged:  PT [place]:  [facility]  Status of Service:  Completed, signed off  If discussed at [place] of Stay Meetings, dates discussed:    Additional Comments:  Carles Collet, RN 01/12/2018, 3:09 PM

## 2018-01-12 NOTE — Plan of Care (Signed)
  Problem: Nutrition: Goal: Adequate nutrition will be maintained Outcome: Progressing   Problem: Elimination: Goal: Will not experience complications related to bowel motility Outcome: Progressing   Problem: Pain Managment: Goal: General experience of comfort will improve Outcome: Progressing   Problem: Skin Integrity: Goal: Risk for impaired skin integrity will decrease Outcome: Progressing   

## 2018-01-13 ENCOUNTER — Ambulatory Visit: Payer: Self-pay | Admitting: Internal Medicine

## 2018-01-13 ENCOUNTER — Encounter (HOSPITAL_COMMUNITY): Payer: Self-pay | Admitting: Orthopedic Surgery

## 2018-01-13 ENCOUNTER — Telehealth (INDEPENDENT_AMBULATORY_CARE_PROVIDER_SITE_OTHER): Payer: Self-pay | Admitting: Orthopedic Surgery

## 2018-01-13 DIAGNOSIS — Z7901 Long term (current) use of anticoagulants: Secondary | ICD-10-CM | POA: Diagnosis not present

## 2018-01-13 DIAGNOSIS — I129 Hypertensive chronic kidney disease with stage 1 through stage 4 chronic kidney disease, or unspecified chronic kidney disease: Secondary | ICD-10-CM | POA: Diagnosis not present

## 2018-01-13 DIAGNOSIS — W19XXXS Unspecified fall, sequela: Secondary | ICD-10-CM | POA: Diagnosis not present

## 2018-01-13 DIAGNOSIS — Z79891 Long term (current) use of opiate analgesic: Secondary | ICD-10-CM | POA: Diagnosis not present

## 2018-01-13 DIAGNOSIS — I872 Venous insufficiency (chronic) (peripheral): Secondary | ICD-10-CM | POA: Diagnosis not present

## 2018-01-13 DIAGNOSIS — K219 Gastro-esophageal reflux disease without esophagitis: Secondary | ICD-10-CM | POA: Diagnosis not present

## 2018-01-13 DIAGNOSIS — I25119 Atherosclerotic heart disease of native coronary artery with unspecified angina pectoris: Secondary | ICD-10-CM | POA: Diagnosis not present

## 2018-01-13 DIAGNOSIS — S8001XD Contusion of right knee, subsequent encounter: Secondary | ICD-10-CM | POA: Diagnosis not present

## 2018-01-13 DIAGNOSIS — N182 Chronic kidney disease, stage 2 (mild): Secondary | ICD-10-CM | POA: Diagnosis not present

## 2018-01-13 DIAGNOSIS — M479 Spondylosis, unspecified: Secondary | ICD-10-CM | POA: Diagnosis not present

## 2018-01-13 DIAGNOSIS — Z87891 Personal history of nicotine dependence: Secondary | ICD-10-CM | POA: Diagnosis not present

## 2018-01-13 DIAGNOSIS — M8580 Other specified disorders of bone density and structure, unspecified site: Secondary | ICD-10-CM | POA: Diagnosis not present

## 2018-01-13 DIAGNOSIS — M11261 Other chondrocalcinosis, right knee: Secondary | ICD-10-CM | POA: Diagnosis not present

## 2018-01-13 DIAGNOSIS — Z955 Presence of coronary angioplasty implant and graft: Secondary | ICD-10-CM | POA: Diagnosis not present

## 2018-01-13 DIAGNOSIS — I4891 Unspecified atrial fibrillation: Secondary | ICD-10-CM | POA: Diagnosis not present

## 2018-01-13 DIAGNOSIS — S83511D Sprain of anterior cruciate ligament of right knee, subsequent encounter: Secondary | ICD-10-CM | POA: Diagnosis not present

## 2018-01-13 DIAGNOSIS — S83241D Other tear of medial meniscus, current injury, right knee, subsequent encounter: Secondary | ICD-10-CM | POA: Diagnosis not present

## 2018-01-13 DIAGNOSIS — M1711 Unilateral primary osteoarthritis, right knee: Secondary | ICD-10-CM | POA: Diagnosis not present

## 2018-01-13 DIAGNOSIS — S838X1D Sprain of other specified parts of right knee, subsequent encounter: Secondary | ICD-10-CM | POA: Diagnosis not present

## 2018-01-13 DIAGNOSIS — S76091D Other specified injury of muscle, fascia and tendon of right hip, subsequent encounter: Secondary | ICD-10-CM | POA: Diagnosis not present

## 2018-01-13 DIAGNOSIS — L97814 Non-pressure chronic ulcer of other part of right lower leg with necrosis of bone: Secondary | ICD-10-CM | POA: Diagnosis not present

## 2018-01-13 DIAGNOSIS — I4510 Unspecified right bundle-branch block: Secondary | ICD-10-CM | POA: Diagnosis not present

## 2018-01-13 DIAGNOSIS — M419 Scoliosis, unspecified: Secondary | ICD-10-CM | POA: Diagnosis not present

## 2018-01-13 NOTE — Anesthesia Postprocedure Evaluation (Signed)
Anesthesia Post Note  Patient: JEILYN REZNIK  Procedure(s) Performed: IRRIGATION AND DEBRIDEMENT RIGHT KNEE, APPLY WOUND VAC (Right )     Patient location during evaluation: PACU Anesthesia Type: General Level of consciousness: awake and alert Pain management: pain level controlled Vital Signs Assessment: post-procedure vital signs reviewed and stable Respiratory status: spontaneous breathing, nonlabored ventilation, respiratory function stable and patient connected to nasal cannula oxygen Cardiovascular status: blood pressure returned to baseline and stable Postop Assessment: no apparent nausea or vomiting Anesthetic complications: no    Last Vitals:  Vitals:   01/11/18 2057 01/12/18 0457  BP: 136/76 131/83  Pulse: 63 61  Resp: 18 18  Temp: 36.9 C (!) 36.3 C  SpO2: 98% 97%    Last Pain:  Vitals:   01/12/18 0800  TempSrc:   PainSc: 0-No pain                 Renesha Lizama S

## 2018-01-13 NOTE — Telephone Encounter (Signed)
Kel from [place] called asking for verbal approval on a home health aid for patient for bathing especially 2 week 2. CB # [phone]

## 2018-01-13 NOTE — Telephone Encounter (Signed)
Called to give verbal ok for [facility] for bathing and also they wanted PT to assess for strengthening not LLE and stability

## 2018-01-15 DIAGNOSIS — Z87891 Personal history of nicotine dependence: Secondary | ICD-10-CM | POA: Diagnosis not present

## 2018-01-15 DIAGNOSIS — M8580 Other specified disorders of bone density and structure, unspecified site: Secondary | ICD-10-CM | POA: Diagnosis not present

## 2018-01-15 DIAGNOSIS — Z955 Presence of coronary angioplasty implant and graft: Secondary | ICD-10-CM | POA: Diagnosis not present

## 2018-01-15 DIAGNOSIS — S83511D Sprain of anterior cruciate ligament of right knee, subsequent encounter: Secondary | ICD-10-CM | POA: Diagnosis not present

## 2018-01-15 DIAGNOSIS — I129 Hypertensive chronic kidney disease with stage 1 through stage 4 chronic kidney disease, or unspecified chronic kidney disease: Secondary | ICD-10-CM | POA: Diagnosis not present

## 2018-01-15 DIAGNOSIS — I4510 Unspecified right bundle-branch block: Secondary | ICD-10-CM | POA: Diagnosis not present

## 2018-01-15 DIAGNOSIS — I872 Venous insufficiency (chronic) (peripheral): Secondary | ICD-10-CM | POA: Diagnosis not present

## 2018-01-15 DIAGNOSIS — K219 Gastro-esophageal reflux disease without esophagitis: Secondary | ICD-10-CM | POA: Diagnosis not present

## 2018-01-15 DIAGNOSIS — N182 Chronic kidney disease, stage 2 (mild): Secondary | ICD-10-CM | POA: Diagnosis not present

## 2018-01-15 DIAGNOSIS — M419 Scoliosis, unspecified: Secondary | ICD-10-CM | POA: Diagnosis not present

## 2018-01-15 DIAGNOSIS — M11261 Other chondrocalcinosis, right knee: Secondary | ICD-10-CM | POA: Diagnosis not present

## 2018-01-15 DIAGNOSIS — S838X1D Sprain of other specified parts of right knee, subsequent encounter: Secondary | ICD-10-CM | POA: Diagnosis not present

## 2018-01-15 DIAGNOSIS — M1711 Unilateral primary osteoarthritis, right knee: Secondary | ICD-10-CM | POA: Diagnosis not present

## 2018-01-15 DIAGNOSIS — W19XXXS Unspecified fall, sequela: Secondary | ICD-10-CM | POA: Diagnosis not present

## 2018-01-15 DIAGNOSIS — S83241D Other tear of medial meniscus, current injury, right knee, subsequent encounter: Secondary | ICD-10-CM | POA: Diagnosis not present

## 2018-01-15 DIAGNOSIS — L97814 Non-pressure chronic ulcer of other part of right lower leg with necrosis of bone: Secondary | ICD-10-CM | POA: Diagnosis not present

## 2018-01-15 DIAGNOSIS — M479 Spondylosis, unspecified: Secondary | ICD-10-CM | POA: Diagnosis not present

## 2018-01-15 DIAGNOSIS — Z79891 Long term (current) use of opiate analgesic: Secondary | ICD-10-CM | POA: Diagnosis not present

## 2018-01-15 DIAGNOSIS — Z7901 Long term (current) use of anticoagulants: Secondary | ICD-10-CM | POA: Diagnosis not present

## 2018-01-15 DIAGNOSIS — S8001XD Contusion of right knee, subsequent encounter: Secondary | ICD-10-CM | POA: Diagnosis not present

## 2018-01-15 DIAGNOSIS — S76091D Other specified injury of muscle, fascia and tendon of right hip, subsequent encounter: Secondary | ICD-10-CM | POA: Diagnosis not present

## 2018-01-15 DIAGNOSIS — I4891 Unspecified atrial fibrillation: Secondary | ICD-10-CM | POA: Diagnosis not present

## 2018-01-15 DIAGNOSIS — I25119 Atherosclerotic heart disease of native coronary artery with unspecified angina pectoris: Secondary | ICD-10-CM | POA: Diagnosis not present

## 2018-01-16 ENCOUNTER — Ambulatory Visit (INDEPENDENT_AMBULATORY_CARE_PROVIDER_SITE_OTHER): Payer: Medicare Other | Admitting: Orthopedic Surgery

## 2018-01-16 ENCOUNTER — Encounter (INDEPENDENT_AMBULATORY_CARE_PROVIDER_SITE_OTHER): Payer: Self-pay | Admitting: Orthopedic Surgery

## 2018-01-16 DIAGNOSIS — L97819 Non-pressure chronic ulcer of other part of right lower leg with unspecified severity: Secondary | ICD-10-CM

## 2018-01-16 NOTE — Progress Notes (Signed)
Office Visit Note   Patient: Hailey [name]           Date of Birth: [birth year]           MRN: [id number] Visit Date: 01/16/2018              Requested by: Cassandria Anger, MD [place], [place] [id number] PCP: Cassandria Anger, MD  Chief Complaint  Patient presents with  . Right Leg - Follow-up, Routine Post Op      HPI: Patient presents in follow-up status post reconstruction for right knee hematoma secondary to bleeding from her blood thinner.  Assessment & Plan: Visit Diagnoses:  1. Ulcer of right knee, unspecified ulcer stage ([place])     Plan: Patient is placed in a double extra-large medical compression stocking that goes above the wound.  Patient states his sock feels very good.  She may begin washing with Dial soap and water continue with knee immobilizer without flexion evaluated follow-up in 2 weeks for suture removal and begin range of motion of the knee.  Follow-Up Instructions: Return in about 2 weeks (around 01/30/2018).   Ortho Exam  Patient is alert, oriented, no adenopathy, well-dressed, normal affect, normal respiratory effort. Examination the wound is well approximated there is no cellulitis no drainage no signs of infection.  Patient does have brawny skin color changes from her chronic venous insufficiency.  A double extra-large medical compression stocking was applied.  Imaging: No results found. No images are attached to the encounter.  Labs: Lab Results  Component Value Date   HGBA1C 5.6 07/04/2014   ESRSEDRATE 31 (H) 08/30/2014   ESRSEDRATE 29 (H) 12/18/2010   ESRSEDRATE 39 (H) 06/14/2009   CRP 0.7 08/30/2014   LABURIC 6.1 06/14/2009   LABORGA ESCHERICHIA COLI 02/19/2017     Lab Results  Component Value Date   ALBUMIN 3.4 (L) 12/24/2017   ALBUMIN 4.1 09/13/2016   ALBUMIN 4.2 04/29/2015   LABURIC 6.1 06/14/2009    There is no height or weight on file to calculate BMI.  Orders:  No orders of the defined types were  placed in this encounter.  No orders of the defined types were placed in this encounter.    Procedures: No procedures performed  Clinical Data: No additional findings.  ROS:  All other systems negative, except as noted in the HPI. Review of Systems  Objective: Vital Signs: There were no vitals taken for this visit.  Specialty Comments:  No specialty comments available.  PMFS History: Patient Active Problem List   Diagnosis Date Noted  . Traumatic hematoma of right knee 01/10/2018  . Ulcer of right knee ([place])   . Bradycardia with 41-50 beats per minute 12/27/2017  . Right bundle branch block (RBBB) on electrocardiogram (ECG) 12/27/2017  . Quadriceps tendon rupture 12/24/2017  . CKD (chronic kidney disease), stage II 12/24/2017  . Cellulitis of leg, right with large prepatella hematoma and open wounds 12/24/2017  . Iliotibial band syndrome of right side 12/16/2017  . Depression 07/11/2017  . Intervertebral lumbar disc disorder with myelopathy, lumbar region 04/25/2017  . Chronic anticoagulation 05/24/2015  . Coronary artery disease involving native coronary artery of native heart with angina pectoris ([place]) 12/20/2014  . Iliotibial band syndrome of left side 11/16/2014  . Diverticulitis of colon 08/30/2014  . PAF (paroxysmal atrial fibrillation) ([place]) 11/21/2012  . Greater trochanteric bursitis of both hips 05/21/2012  . 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 Osteoarthritis of left hip 07/03/2010  .  [place] DISEASE, LUMBOSACRAL SPINE 05/18/2010  . SYNCOPE 10/27/2008  . Essential hypertension 06/16/2007  . GERD 06/16/2007  . COLONIC POLYPS, HX OF 06/16/2007   Past Medical History:  Diagnosis Date  . Acute blood loss anemia 12/25/2017  . Allergic rhinitis   . Anxiety   . Arthritis    "my whole spine" (07/01/2017)  . Atrial fibrillation ([place])   . Bradycardia with 41-50 beats per minute 12/27/2017  . Cellulitis of leg, right with large prepatella hematoma  and open wounds 12/26/2017  . CKD (chronic kidney disease) stage 2, GFR 60-89 ml/min   . Coronary artery disease    10/18 PCI/DES to p/m LCx with cutting balloon to mLcx  . Diverticulosis of colon   . GERD (gastroesophageal reflux disease)   . Hip bursitis 2010   Dr Para March, Post op seroma  . History of colon polyps   . HTN (hypertension)   . IBS (irritable bowel syndrome)    constipation predominant - Dr Earlean Shawl  . Lichen sclerosus   . Osteopenia 11/2016   T score -2.0 FRAX 15%/4.3%  . PAC (premature atrial contraction)    Symptomatiic  . Right bundle branch block (RBBB) on electrocardiogram (ECG) 12/27/2017  . Scoliosis   . SVT (supraventricular tachycardia) (HCC)    brief history    Family History  Problem Relation Age of Onset  . Colon cancer Mother   . Cancer Father        Prostate  . Diabetes Father   . Cancer Brother        Prostate, pancreas  . Cancer Son        Stomach  . Heart attack Neg Hx   . Stroke Neg Hx     Past Surgical History:  Procedure Laterality Date  . ANTERIOR AND POSTERIOR VAGINAL REPAIR  01/2002   Archie Endo 01/23/2011  . APPENDECTOMY  1948  . CARDIAC CATHETERIZATION  06/26/2017  . CORONARY ANGIOPLASTY WITH STENT PLACEMENT  07/01/2017  . CORONARY ATHERECTOMY N/A 07/01/2017   Procedure: CORONARY ATHERECTOMY;  Surgeon: Belva Crome, MD;  Location: [facility];  Service: Cardiovascular;  Laterality: N/A;  . CORONARY STENT INTERVENTION N/A 07/01/2017   Procedure: CORONARY STENT INTERVENTION;  Surgeon: Belva Crome, MD;  Location: [facility];  Service: Cardiovascular;  Laterality: N/A;  . HAMMER TOE SURGERY    . HEMORRHOID BANDING    . HIP SURGERY Left 04/2009   hip examination under anesthesia followed by greater trochanteric bursectomy; iliotibial band tenotomy/notes 01/20/2011  . I&D EXTREMITY Right 01/10/2018   Procedure: IRRIGATION AND DEBRIDEMENT RIGHT KNEE, APPLY WOUND VAC;  Surgeon: Newt Minion, MD;  Location: [facility];  Service:  Orthopedics;  Laterality: Right;  . KNEE BURSECTOMY Right 04/2009   Archie Endo 01/09/2011  . LEFT HEART CATH AND CORONARY ANGIOGRAPHY N/A 06/26/2017   Procedure: LEFT HEART CATH AND CORONARY ANGIOGRAPHY;  Surgeon: Belva Crome, MD;  Location: [facility];  Service: Cardiovascular;  Laterality: N/A;  . PUBOVAGINAL SLING  01/2002   Archie Endo 01/23/2011  . REDUCTION MAMMAPLASTY    . TEMPORARY PACEMAKER N/A 07/01/2017   Procedure: TEMPORARY PACEMAKER;  Surgeon: Belva Crome, MD;  Location: [facility];  Service: Cardiovascular;  Laterality: N/A;  . VAGINAL HYSTERECTOMY  01/2002   Vaginal hysterectomy, bilateral salpingo-oophorectomy/notes 01/23/2011   Social History   Occupational History  . Occupation: Patent attorney: RETIRED  Tobacco Use  . Smoking status: Former Smoker  Packs/day: 0.25    Years: 28.00    Pack years: 7.00    Types: Cigarettes    Last attempt to quit: 1981    Years since quitting: 38.3  . Smokeless tobacco: Never Used  Substance and Sexual Activity  . Alcohol use: Yes    Alcohol/week: 12.6 oz    Types: 7 Standard drinks or equivalent, 14 Shots of liquor per week    Comment: 07/01/2017 "couple shots/day"  . Drug use: No  . Sexual activity: Yes    Birth control/protection: Surgical    Comment: 1st intercourse 83 yo--Fewer than 5 partners

## 2018-01-17 DIAGNOSIS — I25119 Atherosclerotic heart disease of native coronary artery with unspecified angina pectoris: Secondary | ICD-10-CM | POA: Diagnosis not present

## 2018-01-17 DIAGNOSIS — S838X1D Sprain of other specified parts of right knee, subsequent encounter: Secondary | ICD-10-CM | POA: Diagnosis not present

## 2018-01-17 DIAGNOSIS — Z87891 Personal history of nicotine dependence: Secondary | ICD-10-CM | POA: Diagnosis not present

## 2018-01-17 DIAGNOSIS — S83241D Other tear of medial meniscus, current injury, right knee, subsequent encounter: Secondary | ICD-10-CM | POA: Diagnosis not present

## 2018-01-17 DIAGNOSIS — M8580 Other specified disorders of bone density and structure, unspecified site: Secondary | ICD-10-CM | POA: Diagnosis not present

## 2018-01-17 DIAGNOSIS — K219 Gastro-esophageal reflux disease without esophagitis: Secondary | ICD-10-CM | POA: Diagnosis not present

## 2018-01-17 DIAGNOSIS — M479 Spondylosis, unspecified: Secondary | ICD-10-CM | POA: Diagnosis not present

## 2018-01-17 DIAGNOSIS — I872 Venous insufficiency (chronic) (peripheral): Secondary | ICD-10-CM | POA: Diagnosis not present

## 2018-01-17 DIAGNOSIS — L97814 Non-pressure chronic ulcer of other part of right lower leg with necrosis of bone: Secondary | ICD-10-CM | POA: Diagnosis not present

## 2018-01-17 DIAGNOSIS — M1711 Unilateral primary osteoarthritis, right knee: Secondary | ICD-10-CM | POA: Diagnosis not present

## 2018-01-17 DIAGNOSIS — I129 Hypertensive chronic kidney disease with stage 1 through stage 4 chronic kidney disease, or unspecified chronic kidney disease: Secondary | ICD-10-CM | POA: Diagnosis not present

## 2018-01-17 DIAGNOSIS — I4510 Unspecified right bundle-branch block: Secondary | ICD-10-CM | POA: Diagnosis not present

## 2018-01-17 DIAGNOSIS — Z79891 Long term (current) use of opiate analgesic: Secondary | ICD-10-CM | POA: Diagnosis not present

## 2018-01-17 DIAGNOSIS — S83511D Sprain of anterior cruciate ligament of right knee, subsequent encounter: Secondary | ICD-10-CM | POA: Diagnosis not present

## 2018-01-17 DIAGNOSIS — N182 Chronic kidney disease, stage 2 (mild): Secondary | ICD-10-CM | POA: Diagnosis not present

## 2018-01-17 DIAGNOSIS — Z955 Presence of coronary angioplasty implant and graft: Secondary | ICD-10-CM | POA: Diagnosis not present

## 2018-01-17 DIAGNOSIS — W19XXXS Unspecified fall, sequela: Secondary | ICD-10-CM | POA: Diagnosis not present

## 2018-01-17 DIAGNOSIS — M419 Scoliosis, unspecified: Secondary | ICD-10-CM | POA: Diagnosis not present

## 2018-01-17 DIAGNOSIS — S76091D Other specified injury of muscle, fascia and tendon of right hip, subsequent encounter: Secondary | ICD-10-CM | POA: Diagnosis not present

## 2018-01-17 DIAGNOSIS — Z7901 Long term (current) use of anticoagulants: Secondary | ICD-10-CM | POA: Diagnosis not present

## 2018-01-17 DIAGNOSIS — S8001XD Contusion of right knee, subsequent encounter: Secondary | ICD-10-CM | POA: Diagnosis not present

## 2018-01-17 DIAGNOSIS — I4891 Unspecified atrial fibrillation: Secondary | ICD-10-CM | POA: Diagnosis not present

## 2018-01-17 DIAGNOSIS — M11261 Other chondrocalcinosis, right knee: Secondary | ICD-10-CM | POA: Diagnosis not present

## 2018-01-20 ENCOUNTER — Telehealth (INDEPENDENT_AMBULATORY_CARE_PROVIDER_SITE_OTHER): Payer: Self-pay | Admitting: Orthopedic Surgery

## 2018-01-20 ENCOUNTER — Telehealth (INDEPENDENT_AMBULATORY_CARE_PROVIDER_SITE_OTHER): Payer: Self-pay | Admitting: *Deleted

## 2018-01-20 DIAGNOSIS — Z87891 Personal history of nicotine dependence: Secondary | ICD-10-CM | POA: Diagnosis not present

## 2018-01-20 DIAGNOSIS — M1711 Unilateral primary osteoarthritis, right knee: Secondary | ICD-10-CM | POA: Diagnosis not present

## 2018-01-20 DIAGNOSIS — S83511D Sprain of anterior cruciate ligament of right knee, subsequent encounter: Secondary | ICD-10-CM | POA: Diagnosis not present

## 2018-01-20 DIAGNOSIS — M8580 Other specified disorders of bone density and structure, unspecified site: Secondary | ICD-10-CM | POA: Diagnosis not present

## 2018-01-20 DIAGNOSIS — I129 Hypertensive chronic kidney disease with stage 1 through stage 4 chronic kidney disease, or unspecified chronic kidney disease: Secondary | ICD-10-CM | POA: Diagnosis not present

## 2018-01-20 DIAGNOSIS — Z79891 Long term (current) use of opiate analgesic: Secondary | ICD-10-CM | POA: Diagnosis not present

## 2018-01-20 DIAGNOSIS — M419 Scoliosis, unspecified: Secondary | ICD-10-CM | POA: Diagnosis not present

## 2018-01-20 DIAGNOSIS — I4891 Unspecified atrial fibrillation: Secondary | ICD-10-CM | POA: Diagnosis not present

## 2018-01-20 DIAGNOSIS — S8001XD Contusion of right knee, subsequent encounter: Secondary | ICD-10-CM | POA: Diagnosis not present

## 2018-01-20 DIAGNOSIS — I25119 Atherosclerotic heart disease of native coronary artery with unspecified angina pectoris: Secondary | ICD-10-CM | POA: Diagnosis not present

## 2018-01-20 DIAGNOSIS — N182 Chronic kidney disease, stage 2 (mild): Secondary | ICD-10-CM | POA: Diagnosis not present

## 2018-01-20 DIAGNOSIS — S838X1D Sprain of other specified parts of right knee, subsequent encounter: Secondary | ICD-10-CM | POA: Diagnosis not present

## 2018-01-20 DIAGNOSIS — I4510 Unspecified right bundle-branch block: Secondary | ICD-10-CM | POA: Diagnosis not present

## 2018-01-20 DIAGNOSIS — K219 Gastro-esophageal reflux disease without esophagitis: Secondary | ICD-10-CM | POA: Diagnosis not present

## 2018-01-20 DIAGNOSIS — M479 Spondylosis, unspecified: Secondary | ICD-10-CM | POA: Diagnosis not present

## 2018-01-20 DIAGNOSIS — I872 Venous insufficiency (chronic) (peripheral): Secondary | ICD-10-CM | POA: Diagnosis not present

## 2018-01-20 DIAGNOSIS — M11261 Other chondrocalcinosis, right knee: Secondary | ICD-10-CM | POA: Diagnosis not present

## 2018-01-20 DIAGNOSIS — Z955 Presence of coronary angioplasty implant and graft: Secondary | ICD-10-CM | POA: Diagnosis not present

## 2018-01-20 DIAGNOSIS — S83241D Other tear of medial meniscus, current injury, right knee, subsequent encounter: Secondary | ICD-10-CM | POA: Diagnosis not present

## 2018-01-20 DIAGNOSIS — Z7901 Long term (current) use of anticoagulants: Secondary | ICD-10-CM | POA: Diagnosis not present

## 2018-01-20 DIAGNOSIS — S76091D Other specified injury of muscle, fascia and tendon of right hip, subsequent encounter: Secondary | ICD-10-CM | POA: Diagnosis not present

## 2018-01-20 DIAGNOSIS — L97814 Non-pressure chronic ulcer of other part of right lower leg with necrosis of bone: Secondary | ICD-10-CM | POA: Diagnosis not present

## 2018-01-20 DIAGNOSIS — W19XXXS Unspecified fall, sequela: Secondary | ICD-10-CM | POA: Diagnosis not present

## 2018-01-20 NOTE — Telephone Encounter (Signed)
Claiborne Billings, PT, from [facility] called to let us know that her two wounds are open and draining.  She requested a VO for nursing management.  CB#[phone].  Thank you.

## 2018-01-20 NOTE — Telephone Encounter (Signed)
Message needed triage attention

## 2018-01-20 NOTE — Telephone Encounter (Signed)
Pt states she had surgery a couple days ago and noticed that she is having slight bleeding at the incision site. She advised me that she is taking blood thinner. I advised pt if she is bleeding to much and only a little bit to just clean it up and apply gauge to the area and use her compression stockings and if it looks like it is bleeding a lot and seeping thru her bandages to please call us and we can work her in to be seen. Pt voiced understanding and will do as instructed.

## 2018-01-21 ENCOUNTER — Ambulatory Visit (INDEPENDENT_AMBULATORY_CARE_PROVIDER_SITE_OTHER): Payer: Medicare Other | Admitting: Orthopedic Surgery

## 2018-01-21 ENCOUNTER — Encounter (INDEPENDENT_AMBULATORY_CARE_PROVIDER_SITE_OTHER): Payer: Self-pay | Admitting: Orthopedic Surgery

## 2018-01-21 VITALS — Ht 64.0 in | Wt 171.0 lb

## 2018-01-21 DIAGNOSIS — L97814 Non-pressure chronic ulcer of other part of right lower leg with necrosis of bone: Secondary | ICD-10-CM

## 2018-01-21 DIAGNOSIS — S8001XS Contusion of right knee, sequela: Secondary | ICD-10-CM

## 2018-01-21 NOTE — Telephone Encounter (Signed)
I called pt and advised that we would like to see her today. Pt will come in at 2:30 this afternoon. Need to eval incision for changes and then can update orders for [facility] if needed.

## 2018-01-21 NOTE — Progress Notes (Signed)
Office Visit Note   Patient: Hailey [name]           Date of Birth: 08-14-1935           MRN: [id number] Visit Date: 01/21/2018              Requested by: Cassandria Anger, MD [place], [place] [id number] PCP: Cassandria Anger, MD  Chief Complaint  Patient presents with  . Right Knee - Routine Post Op    I&D right knee with vac 01/10/18      HPI: Patient presents in follow-up status post treatment for a large hematoma right knee as well as a venous stasis ulcer right leg she is currently locked in extension with a Bledsoe brace.  She has a single layer of a compression stocking.  Assessment & Plan: Visit Diagnoses:  1. Ulcer of knee, right, with necrosis of bone ([place])   2. Traumatic hematoma of knee, right, sequela     Plan: The tibial venous ulcer has excellent beefy granulation tissue we will add the second layer sleeve to be worn during the day so she will wear 2 hours during the day 1 layer around the clock.  Any drainage a gauze dressing should be applied outside the sock.  Follow-up in 1 week to harvest sutures and allow for 45 degrees range of motion.  Follow-Up Instructions: Return in about 1 week (around 01/28/2018).   Ortho Exam  Patient is alert, oriented, no adenopathy, well-dressed, normal affect, normal respiratory effort. Examination the wound is well approximated there is no ischemic changes.  She has brawny skin color changes with a venous ulcer which is healing nicely this is 10 x 15 mm and 1 mm deep.  There is no cellulitis no odor no drainage no signs of infection.  Imaging: No results found. No images are attached to the encounter.  Labs: Lab Results  Component Value Date   HGBA1C 5.6 07/04/2014   ESRSEDRATE 31 (H) 08/30/2014   ESRSEDRATE 29 (H) 12/18/2010   ESRSEDRATE 39 (H) 06/14/2009   CRP 0.7 08/30/2014   LABURIC 6.1 06/14/2009   LABORGA ESCHERICHIA COLI 02/19/2017     Lab Results  Component Value Date   ALBUMIN 3.4 (L)  12/24/2017   ALBUMIN 4.1 09/13/2016   ALBUMIN 4.2 04/29/2015   LABURIC 6.1 06/14/2009    Body mass index is 29.35 kg/m.  Orders:  No orders of the defined types were placed in this encounter.  No orders of the defined types were placed in this encounter.    Procedures: No procedures performed  Clinical Data: No additional findings.  ROS:  All other systems negative, except as noted in the HPI. Review of Systems  Objective: Vital Signs: Ht 5\' 4"  (1.626 m)   Wt 171 lb (77.6 kg)   BMI 29.35 kg/m   Specialty Comments:  No specialty comments available.  PMFS History: Patient Active Problem List   Diagnosis Date Noted  . Traumatic hematoma of right knee 01/10/2018  . Ulcer of right knee ([place])   . Bradycardia with 41-50 beats per minute 12/27/2017  . Right bundle branch block (RBBB) on electrocardiogram (ECG) 12/27/2017  . Quadriceps tendon rupture 12/24/2017  . CKD (chronic kidney disease), stage II 12/24/2017  . Cellulitis of leg, right with large prepatella hematoma and open wounds 12/24/2017  . Iliotibial band syndrome of right side 12/16/2017  . Depression 07/11/2017  . Intervertebral lumbar disc disorder with myelopathy, lumbar region 04/25/2017  .  Chronic anticoagulation 05/24/2015  . Coronary artery disease involving native coronary artery of native heart with angina pectoris ([place]) 12/20/2014  . Iliotibial band syndrome of left side 11/16/2014  . Diverticulitis of colon 08/30/2014  . PAF (paroxysmal atrial fibrillation) ([place]) 11/21/2012  . Greater trochanteric bursitis of both hips 05/21/2012  . 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 Osteoarthritis of left hip 07/03/2010  . [place] DISEASE, LUMBOSACRAL SPINE 05/18/2010  . SYNCOPE 10/27/2008  . Essential hypertension 06/16/2007  . GERD 06/16/2007  . COLONIC POLYPS, HX OF 06/16/2007   Past Medical History:  Diagnosis Date  . Acute blood loss anemia 12/25/2017  . Allergic rhinitis   . Anxiety    . Arthritis    "my whole spine" (07/01/2017)  . Atrial fibrillation ([place])   . Bradycardia with 41-50 beats per minute 12/27/2017  . Cellulitis of leg, right with large prepatella hematoma and open wounds 12/26/2017  . CKD (chronic kidney disease) stage 2, GFR 60-89 ml/min   . Coronary artery disease    10/18 PCI/DES to p/m LCx with cutting balloon to mLcx  . Diverticulosis of colon   . GERD (gastroesophageal reflux disease)   . Hip bursitis 2010   Dr Para March, Post op seroma  . History of colon polyps   . HTN (hypertension)   . IBS (irritable bowel syndrome)    constipation predominant - Dr Earlean Shawl  . Lichen sclerosus   . Osteopenia 11/2016   T score -2.0 FRAX 15%/4.3%  . PAC (premature atrial contraction)    Symptomatiic  . Right bundle branch block (RBBB) on electrocardiogram (ECG) 12/27/2017  . Scoliosis   . SVT (supraventricular tachycardia) (HCC)    brief history    Family History  Problem Relation Age of Onset  . Colon cancer Mother   . Cancer Father        Prostate  . Diabetes Father   . Cancer Brother        Prostate, pancreas  . Cancer Son        Stomach  . Heart attack Neg Hx   . Stroke Neg Hx     Past Surgical History:  Procedure Laterality Date  . ANTERIOR AND POSTERIOR VAGINAL REPAIR  01/2002   Archie Endo 01/23/2011  . APPENDECTOMY  1948  . CARDIAC CATHETERIZATION  06/26/2017  . CORONARY ANGIOPLASTY WITH STENT PLACEMENT  07/01/2017  . CORONARY ATHERECTOMY N/A 07/01/2017   Procedure: CORONARY ATHERECTOMY;  Surgeon: Belva Crome, MD;  Location: [facility];  Service: Cardiovascular;  Laterality: N/A;  . CORONARY STENT INTERVENTION N/A 07/01/2017   Procedure: CORONARY STENT INTERVENTION;  Surgeon: Belva Crome, MD;  Location: [facility];  Service: Cardiovascular;  Laterality: N/A;  . HAMMER TOE SURGERY    . HEMORRHOID BANDING    . HIP SURGERY Left 04/2009   hip examination under anesthesia followed by greater trochanteric bursectomy;  iliotibial band tenotomy/notes 01/20/2011  . I&D EXTREMITY Right 01/10/2018   Procedure: IRRIGATION AND DEBRIDEMENT RIGHT KNEE, APPLY WOUND VAC;  Surgeon: Newt Minion, MD;  Location: [facility];  Service: Orthopedics;  Laterality: Right;  . KNEE BURSECTOMY Right 04/2009   Archie Endo 01/09/2011  . LEFT HEART CATH AND CORONARY ANGIOGRAPHY N/A 06/26/2017   Procedure: LEFT HEART CATH AND CORONARY ANGIOGRAPHY;  Surgeon: Belva Crome, MD;  Location: [facility];  Service: Cardiovascular;  Laterality: N/A;  . PUBOVAGINAL SLING  01/2002   Archie Endo 01/23/2011  . REDUCTION MAMMAPLASTY    . TEMPORARY  PACEMAKER N/A 07/01/2017   Procedure: TEMPORARY PACEMAKER;  Surgeon: Belva Crome, MD;  Location: [facility];  Service: Cardiovascular;  Laterality: N/A;  . VAGINAL HYSTERECTOMY  01/2002   Vaginal hysterectomy, bilateral salpingo-oophorectomy/notes 01/23/2011   Social History   Occupational History  . Occupation: Patent attorney: RETIRED  Tobacco Use  . Smoking status: Former Smoker    Packs/day: 0.25    Years: 28.00    Pack years: 7.00    Types: Cigarettes    Last attempt to quit: 1981    Years since quitting: 38.3  . Smokeless tobacco: Never Used  Substance and Sexual Activity  . Alcohol use: Yes    Alcohol/week: 12.6 oz    Types: 7 Standard drinks or equivalent, 14 Shots of liquor per week    Comment: 07/01/2017 "couple shots/day"  . Drug use: No  . Sexual activity: Yes    Birth control/protection: Surgical    Comment: 1st intercourse 83 yo--Fewer than 5 partners

## 2018-01-28 DIAGNOSIS — Z87891 Personal history of nicotine dependence: Secondary | ICD-10-CM | POA: Diagnosis not present

## 2018-01-28 DIAGNOSIS — S83241D Other tear of medial meniscus, current injury, right knee, subsequent encounter: Secondary | ICD-10-CM | POA: Diagnosis not present

## 2018-01-28 DIAGNOSIS — S8001XD Contusion of right knee, subsequent encounter: Secondary | ICD-10-CM | POA: Diagnosis not present

## 2018-01-28 DIAGNOSIS — W19XXXS Unspecified fall, sequela: Secondary | ICD-10-CM | POA: Diagnosis not present

## 2018-01-28 DIAGNOSIS — I4510 Unspecified right bundle-branch block: Secondary | ICD-10-CM | POA: Diagnosis not present

## 2018-01-28 DIAGNOSIS — N182 Chronic kidney disease, stage 2 (mild): Secondary | ICD-10-CM | POA: Diagnosis not present

## 2018-01-28 DIAGNOSIS — S838X1D Sprain of other specified parts of right knee, subsequent encounter: Secondary | ICD-10-CM | POA: Diagnosis not present

## 2018-01-28 DIAGNOSIS — I129 Hypertensive chronic kidney disease with stage 1 through stage 4 chronic kidney disease, or unspecified chronic kidney disease: Secondary | ICD-10-CM | POA: Diagnosis not present

## 2018-01-28 DIAGNOSIS — Z79891 Long term (current) use of opiate analgesic: Secondary | ICD-10-CM | POA: Diagnosis not present

## 2018-01-28 DIAGNOSIS — I872 Venous insufficiency (chronic) (peripheral): Secondary | ICD-10-CM | POA: Diagnosis not present

## 2018-01-28 DIAGNOSIS — M8580 Other specified disorders of bone density and structure, unspecified site: Secondary | ICD-10-CM | POA: Diagnosis not present

## 2018-01-28 DIAGNOSIS — Z955 Presence of coronary angioplasty implant and graft: Secondary | ICD-10-CM | POA: Diagnosis not present

## 2018-01-28 DIAGNOSIS — M11261 Other chondrocalcinosis, right knee: Secondary | ICD-10-CM | POA: Diagnosis not present

## 2018-01-28 DIAGNOSIS — I25119 Atherosclerotic heart disease of native coronary artery with unspecified angina pectoris: Secondary | ICD-10-CM | POA: Diagnosis not present

## 2018-01-28 DIAGNOSIS — Z7901 Long term (current) use of anticoagulants: Secondary | ICD-10-CM | POA: Diagnosis not present

## 2018-01-28 DIAGNOSIS — M419 Scoliosis, unspecified: Secondary | ICD-10-CM | POA: Diagnosis not present

## 2018-01-28 DIAGNOSIS — M1711 Unilateral primary osteoarthritis, right knee: Secondary | ICD-10-CM | POA: Diagnosis not present

## 2018-01-28 DIAGNOSIS — S83511D Sprain of anterior cruciate ligament of right knee, subsequent encounter: Secondary | ICD-10-CM | POA: Diagnosis not present

## 2018-01-28 DIAGNOSIS — L97814 Non-pressure chronic ulcer of other part of right lower leg with necrosis of bone: Secondary | ICD-10-CM | POA: Diagnosis not present

## 2018-01-28 DIAGNOSIS — S76091D Other specified injury of muscle, fascia and tendon of right hip, subsequent encounter: Secondary | ICD-10-CM | POA: Diagnosis not present

## 2018-01-28 DIAGNOSIS — M479 Spondylosis, unspecified: Secondary | ICD-10-CM | POA: Diagnosis not present

## 2018-01-28 DIAGNOSIS — I4891 Unspecified atrial fibrillation: Secondary | ICD-10-CM | POA: Diagnosis not present

## 2018-01-28 DIAGNOSIS — K219 Gastro-esophageal reflux disease without esophagitis: Secondary | ICD-10-CM | POA: Diagnosis not present

## 2018-01-30 ENCOUNTER — Encounter (INDEPENDENT_AMBULATORY_CARE_PROVIDER_SITE_OTHER): Payer: Self-pay | Admitting: Orthopedic Surgery

## 2018-01-30 ENCOUNTER — Ambulatory Visit (INDEPENDENT_AMBULATORY_CARE_PROVIDER_SITE_OTHER): Payer: Medicare Other | Admitting: Orthopedic Surgery

## 2018-01-30 VITALS — Ht 64.0 in | Wt 171.0 lb

## 2018-01-30 DIAGNOSIS — L97814 Non-pressure chronic ulcer of other part of right lower leg with necrosis of bone: Secondary | ICD-10-CM

## 2018-01-30 NOTE — Progress Notes (Signed)
Office Visit Note   Patient: Hailey [name]           Date of Birth: 1935-02-03           MRN: [id number] Visit Date: 01/30/2018              Requested by: Cassandria Anger, MD [place], [place] [address] PCP: Cassandria Anger, MD  Chief Complaint  Patient presents with  . Right Knee - Routine Post Op    01/10/18 Irrigation and debridement right knee       HPI: Patient presents for follow-up status post surgical intervention for debridement of complex wound closure for the right knee.  She is currently in a Bledsoe brace when he is out straight she has a compression stocking for the venous ulceration  Assessment & Plan: Visit Diagnoses:  1. Ulcer of knee, right, with necrosis of bone ([place])     Plan: Patient will go to the medical supply store to obtain an extra-large 2 layer compression stocking.  She will discontinue the Bledsoe brace she will not move her knee past 0 190 she will work on quad and hamstring strengthening a prescription was provided for her trainer.  Follow-Up Instructions: Return in about 2 weeks (around 02/13/2018).   Ortho Exam  Patient is alert, oriented, no adenopathy, well-dressed, normal affect, normal respiratory effort. Examination incision is well-healed she has active flexion from 0 to 90 degrees without tension on the wound.  We will harvest the sutures today the wound over the tibial crest has good granulation tissue she is currently wearing a double extra-large sock her calf measures 39 cm insert [place] and we will have her get a extra-large sock.  Imaging: No results found. No images are attached to the encounter.  Labs: Lab Results  Component Value Date   HGBA1C 5.6 07/04/2014   ESRSEDRATE 31 (H) 08/30/2014   ESRSEDRATE 29 (H) 12/18/2010   ESRSEDRATE 39 (H) 06/14/2009   CRP 0.7 08/30/2014   LABURIC 6.1 06/14/2009   LABORGA ESCHERICHIA COLI 02/19/2017     Lab Results  Component Value Date   ALBUMIN 3.4 (L)  12/24/2017   ALBUMIN 4.1 09/13/2016   ALBUMIN 4.2 04/29/2015   LABURIC 6.1 06/14/2009    Body mass index is 29.35 kg/m.  Orders:  No orders of the defined types were placed in this encounter.  No orders of the defined types were placed in this encounter.    Procedures: No procedures performed  Clinical Data: No additional findings.  ROS:  All other systems negative, except as noted in the HPI. Review of Systems  Objective: Vital Signs: Ht 5\' 4"  (1.626 m)   Wt 171 lb (77.6 kg)   BMI 29.35 kg/m   Specialty Comments:  No specialty comments available.  PMFS History: Patient Active Problem List   Diagnosis Date Noted  . Traumatic hematoma of right knee 01/10/2018  . Ulcer of right knee ([place])   . Bradycardia with 41-50 beats per minute 12/27/2017  . Right bundle branch block (RBBB) on electrocardiogram (ECG) 12/27/2017  . Quadriceps tendon rupture 12/24/2017  . CKD (chronic kidney disease), stage II 12/24/2017  . Cellulitis of leg, right with large prepatella hematoma and open wounds 12/24/2017  . Iliotibial band syndrome of right side 12/16/2017  . Depression 07/11/2017  . Intervertebral lumbar disc disorder with myelopathy, lumbar region 04/25/2017  . Chronic anticoagulation 05/24/2015  . Coronary artery disease involving native coronary artery of native heart with  angina pectoris ([place]) 12/20/2014  . Iliotibial band syndrome of left side 11/16/2014  . Diverticulitis of colon 08/30/2014  . PAF (paroxysmal atrial fibrillation) ([place]) 11/21/2012  . Greater trochanteric bursitis of both hips 05/21/2012  . 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 Osteoarthritis of left hip 07/03/2010  . [place] DISEASE, LUMBOSACRAL SPINE 05/18/2010  . SYNCOPE 10/27/2008  . Essential hypertension 06/16/2007  . GERD 06/16/2007  . COLONIC POLYPS, HX OF 06/16/2007   Past Medical History:  Diagnosis Date  . Acute blood loss anemia 12/25/2017  . Allergic rhinitis   . Anxiety    . Arthritis    "my whole spine" (07/01/2017)  . Atrial fibrillation ([place])   . Bradycardia with 41-50 beats per minute 12/27/2017  . Cellulitis of leg, right with large prepatella hematoma and open wounds 12/26/2017  . CKD (chronic kidney disease) stage 2, GFR 60-89 ml/min   . Coronary artery disease    10/18 PCI/DES to p/m LCx with cutting balloon to mLcx  . Diverticulosis of colon   . GERD (gastroesophageal reflux disease)   . Hip bursitis 2010   Dr Para March, Post op seroma  . History of colon polyps   . HTN (hypertension)   . IBS (irritable bowel syndrome)    constipation predominant - Dr Earlean Shawl  . Lichen sclerosus   . Osteopenia 11/2016   T score -2.0 FRAX 15%/4.3%  . PAC (premature atrial contraction)    Symptomatiic  . Right bundle branch block (RBBB) on electrocardiogram (ECG) 12/27/2017  . Scoliosis   . SVT (supraventricular tachycardia) (H[facility])    brief history    Family History  Problem Relation Age of Onset  . Colon cancer Mother   . Cancer Father        Prostate  . Diabetes Father   . Cancer Brother        Prostate, pancreas  . Cancer Son        Stomach  . Heart attack Neg Hx   . Stroke Neg Hx     Past Surgical History:  Procedure Laterality Date  . ANTERIOR AND POSTERIOR VAGINAL REPAIR  01/2002   Archie Endo 01/23/2011  . APPENDECTOMY  1948  . CARDIAC CATHETERIZATION  06/26/2017  . CORONARY ANGIOPLASTY WITH STENT PLACEMENT  07/01/2017  . CORONARY ATHERECTOMY N/A 07/01/2017   Procedure: CORONARY ATHERECTOMY;  Surgeon: Belva Crome, MD;  Location: [facility];  Service: Cardiovascular;  Laterality: N/A;  . CORONARY STENT INTERVENTION N/A 07/01/2017   Procedure: CORONARY STENT INTERVENTION;  Surgeon: Belva Crome, MD;  Location: [facility];  Service: Cardiovascular;  Laterality: N/A;  . HAMMER TOE SURGERY    . HEMORRHOID BANDING    . HIP SURGERY Left 04/2009   hip examination under anesthesia followed by greater trochanteric bursectomy;  iliotibial band tenotomy/notes 01/20/2011  . I&D EXTREMITY Right 01/10/2018   Procedure: IRRIGATION AND DEBRIDEMENT RIGHT KNEE, APPLY WOUND VAC;  Surgeon: Newt Minion, MD;  Location: [facility];  Service: Orthopedics;  Laterality: Right;  . KNEE BURSECTOMY Right 04/2009   Archie Endo 01/09/2011  . LEFT HEART CATH AND CORONARY ANGIOGRAPHY N/A 06/26/2017   Procedure: LEFT HEART CATH AND CORONARY ANGIOGRAPHY;  Surgeon: Belva Crome, MD;  Location: [facility];  Service: Cardiovascular;  Laterality: N/A;  . PUBOVAGINAL SLING  01/2002   Archie Endo 01/23/2011  . REDUCTION MAMMAPLASTY    . TEMPORARY PACEMAKER N/A 07/01/2017   Procedure: TEMPORARY PACEMAKER;  Surgeon: Belva Crome, MD;  Location:  [facility];  Service: Cardiovascular;  Laterality: N/A;  . VAGINAL HYSTERECTOMY  01/2002   Vaginal hysterectomy, bilateral salpingo-oophorectomy/notes 01/23/2011   Social History   Occupational History  . Occupation: Patent attorney: RETIRED  Tobacco Use  . Smoking status: Former Smoker    Packs/day: 0.25    Years: 28.00    Pack years: 7.00    Types: Cigarettes    Last attempt to quit: 1981    Years since quitting: 38.4  . Smokeless tobacco: Never Used  Substance and Sexual Activity  . Alcohol use: Yes    Alcohol/week: 12.6 oz    Types: 7 Standard drinks or equivalent, 14 Shots of liquor per week    Comment: 07/01/2017 "couple shots/day"  . Drug use: No  . Sexual activity: Yes    Birth control/protection: Surgical    Comment: 1st intercourse 83 yo--Fewer than 5 partners

## 2018-02-13 ENCOUNTER — Ambulatory Visit (INDEPENDENT_AMBULATORY_CARE_PROVIDER_SITE_OTHER): Payer: Medicare Other | Admitting: Orthopedic Surgery

## 2018-02-13 ENCOUNTER — Encounter (INDEPENDENT_AMBULATORY_CARE_PROVIDER_SITE_OTHER): Payer: Self-pay | Admitting: Orthopedic Surgery

## 2018-02-13 VITALS — Ht 64.0 in | Wt 171.0 lb

## 2018-02-13 DIAGNOSIS — L97814 Non-pressure chronic ulcer of other part of right lower leg with necrosis of bone: Secondary | ICD-10-CM

## 2018-02-13 NOTE — Progress Notes (Signed)
Office Visit Note   Patient: Hailey [name]           Date of Birth: 1935-06-13           MRN: [id number] Visit Date: 02/13/2018              Requested by: Cassandria Anger, MD [place], [place] [address] PCP: Cassandria Anger, MD  Chief Complaint  Patient presents with  . Right Knee - Routine Post Op    01/10/18 I&D right knee traumatic hematoma       HPI: Patient is an 83 year old woman who presents in follow-up for a massive hematoma right knee underwent surgical intervention application of wound VAC and a Vive wear medical compression stocking.  Patient is quite pleased with her progress.  She is currently working out at [employer].  Assessment & Plan: Visit Diagnoses:  1. Ulcer of knee, right, with necrosis of bone ([place])     Plan: Recommended wearing the compression stocking as much as she can if she has to get dressed up she can go without the stocking but recommended stocking for traveling exercise and at night.  She will follow-up as needed.  Recommended trying to decrease her blood thinner medication.  She will discuss this with her cardiologist.  Follow-Up Instructions: Return if symptoms worsen or fail to improve.   Ortho Exam  Patient is alert, oriented, no adenopathy, well-dressed, normal affect, normal respiratory effort. Examination these wound has completely healed she has a very small scab.  She still has venous stasis changes with a few areas of ecchymosis and bruising but no open ulcers.  The knee has excellent range of motion from 0 to 100 degrees.  She ambulates independently without a limp.  Imaging: No results found. No images are attached to the encounter.  Labs: Lab Results  Component Value Date   HGBA1C 5.6 07/04/2014   ESRSEDRATE 31 (H) 08/30/2014   ESRSEDRATE 29 (H) 12/18/2010   ESRSEDRATE 39 (H) 06/14/2009   CRP 0.7 08/30/2014   LABURIC 6.1 06/14/2009   LABORGA ESCHERICHIA COLI 02/19/2017     Lab Results  Component  Value Date   ALBUMIN 3.4 (L) 12/24/2017   ALBUMIN 4.1 09/13/2016   ALBUMIN 4.2 04/29/2015   LABURIC 6.1 06/14/2009    Body mass index is 29.35 kg/m.  Orders:  No orders of the defined types were placed in this encounter.  No orders of the defined types were placed in this encounter.    Procedures: No procedures performed  Clinical Data: No additional findings.  ROS:  All other systems negative, except as noted in the HPI. Review of Systems  Objective: Vital Signs: Ht 5\' 4"  (1.626 m)   Wt 171 lb (77.6 kg)   BMI 29.35 kg/m   Specialty Comments:  No specialty comments available.  PMFS History: Patient Active Problem List   Diagnosis Date Noted  . Traumatic hematoma of right knee 01/10/2018  . Ulcer of right knee ([place])   . Bradycardia with 41-50 beats per minute 12/27/2017  . Right bundle branch block (RBBB) on electrocardiogram (ECG) 12/27/2017  . Quadriceps tendon rupture 12/24/2017  . CKD (chronic kidney disease), stage II 12/24/2017  . Cellulitis of leg, right with large prepatella hematoma and open wounds 12/24/2017  . Iliotibial band syndrome of right side 12/16/2017  . Depression 07/11/2017  . Intervertebral lumbar disc disorder with myelopathy, lumbar region 04/25/2017  . Chronic anticoagulation 05/24/2015  . Coronary artery disease involving  native coronary artery of native heart with angina pectoris ([place]) 12/20/2014  . Iliotibial band syndrome of left side 11/16/2014  . Diverticulitis of colon 08/30/2014  . PAF (paroxysmal atrial fibrillation) ([place]) 11/21/2012  . Greater trochanteric bursitis of both hips 05/21/2012  . 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 Osteoarthritis of left hip 07/03/2010  . [place] DISEASE, LUMBOSACRAL SPINE 05/18/2010  . SYNCOPE 10/27/2008  . Essential hypertension 06/16/2007  . GERD 06/16/2007  . COLONIC POLYPS, HX OF 06/16/2007   Past Medical History:  Diagnosis Date  . Acute blood loss anemia 12/25/2017  .  Allergic rhinitis   . Anxiety   . Arthritis    "my whole spine" (07/01/2017)  . Atrial fibrillation ([place])   . Bradycardia with 41-50 beats per minute 12/27/2017  . Cellulitis of leg, right with large prepatella hematoma and open wounds 12/26/2017  . CKD (chronic kidney disease) stage 2, GFR 60-89 ml/min   . Coronary artery disease    10/18 PCI/DES to p/m LCx with cutting balloon to mLcx  . Diverticulosis of colon   . GERD (gastroesophageal reflux disease)   . Hip bursitis 2010   Dr Para March, Post op seroma  . History of colon polyps   . HTN (hypertension)   . IBS (irritable bowel syndrome)    constipation predominant - Dr Earlean Shawl  . Lichen sclerosus   . Osteopenia 11/2016   T score -2.0 FRAX 15%/4.3%  . PAC (premature atrial contraction)    Symptomatiic  . Right bundle branch block (RBBB) on electrocardiogram (ECG) 12/27/2017  . Scoliosis   . SVT (supraventricular tachycardia) ([facility])    brief history    Family History  Problem Relation Age of Onset  . Colon cancer Mother   . Cancer Father        Prostate  . Diabetes Father   . Cancer Brother        Prostate, pancreas  . Cancer Son        Stomach  . Heart attack Neg Hx   . Stroke Neg Hx     Past Surgical History:  Procedure Laterality Date  . ANTERIOR AND POSTERIOR VAGINAL REPAIR  01/2002   Archie Endo 01/23/2011  . APPENDECTOMY  1948  . CARDIAC CATHETERIZATION  06/26/2017  . CORONARY ANGIOPLASTY WITH STENT PLACEMENT  07/01/2017  . CORONARY ATHERECTOMY N/A 07/01/2017   Procedure: CORONARY ATHERECTOMY;  Surgeon: Belva Crome, MD;  Location: [facility];  Service: Cardiovascular;  Laterality: N/A;  . CORONARY STENT INTERVENTION N/A 07/01/2017   Procedure: CORONARY STENT INTERVENTION;  Surgeon: Belva Crome, MD;  Location: [facility];  Service: Cardiovascular;  Laterality: N/A;  . HAMMER TOE SURGERY    . HEMORRHOID BANDING    . HIP SURGERY Left 04/2009   hip examination under anesthesia followed by greater  trochanteric bursectomy; iliotibial band tenotomy/notes 01/20/2011  . I&D EXTREMITY Right 01/10/2018   Procedure: IRRIGATION AND DEBRIDEMENT RIGHT KNEE, APPLY WOUND VAC;  Surgeon: Newt Minion, MD;  Location: [facility];  Service: Orthopedics;  Laterality: Right;  . KNEE BURSECTOMY Right 04/2009   Archie Endo 01/09/2011  . LEFT HEART CATH AND CORONARY ANGIOGRAPHY N/A 06/26/2017   Procedure: LEFT HEART CATH AND CORONARY ANGIOGRAPHY;  Surgeon: Belva Crome, MD;  Location: [facility];  Service: Cardiovascular;  Laterality: N/A;  . PUBOVAGINAL SLING  01/2002   Archie Endo 01/23/2011  . REDUCTION MAMMAPLASTY    . TEMPORARY PACEMAKER N/A 07/01/2017   Procedure: TEMPORARY PACEMAKER;  Surgeon: Belva Crome, MD;  Location: [facility];  Service: Cardiovascular;  Laterality: N/A;  . VAGINAL HYSTERECTOMY  01/2002   Vaginal hysterectomy, bilateral salpingo-oophorectomy/notes 01/23/2011   Social History   Occupational History  . Occupation: Patent attorney: RETIRED  Tobacco Use  . Smoking status: Former Smoker    Packs/day: 0.25    Years: 28.00    Pack years: 7.00    Types: Cigarettes    Last attempt to quit: 1981    Years since quitting: 38.4  . Smokeless tobacco: Never Used  Substance and Sexual Activity  . Alcohol use: Yes    Alcohol/week: 12.6 oz    Types: 7 Standard drinks or equivalent, 14 Shots of liquor per week    Comment: 07/01/2017 "couple shots/day"  . Drug use: No  . Sexual activity: Yes    Birth control/protection: Surgical    Comment: 1st intercourse 83 yo--Fewer than 5 partners

## 2018-02-21 ENCOUNTER — Other Ambulatory Visit (INDEPENDENT_AMBULATORY_CARE_PROVIDER_SITE_OTHER): Payer: Medicare Other

## 2018-02-21 ENCOUNTER — Ambulatory Visit (INDEPENDENT_AMBULATORY_CARE_PROVIDER_SITE_OTHER): Payer: Medicare Other | Admitting: Internal Medicine

## 2018-02-21 ENCOUNTER — Encounter: Payer: Self-pay | Admitting: Internal Medicine

## 2018-02-21 DIAGNOSIS — I480 Paroxysmal atrial fibrillation: Secondary | ICD-10-CM | POA: Diagnosis not present

## 2018-02-21 DIAGNOSIS — N182 Chronic kidney disease, stage 2 (mild): Secondary | ICD-10-CM

## 2018-02-21 DIAGNOSIS — Z7901 Long term (current) use of anticoagulants: Secondary | ICD-10-CM

## 2018-02-21 DIAGNOSIS — I25119 Atherosclerotic heart disease of native coronary artery with unspecified angina pectoris: Secondary | ICD-10-CM | POA: Diagnosis not present

## 2018-02-21 DIAGNOSIS — I10 Essential (primary) hypertension: Secondary | ICD-10-CM

## 2018-02-21 DIAGNOSIS — R55 Syncope and collapse: Secondary | ICD-10-CM

## 2018-02-21 LAB — CBC WITH DIFFERENTIAL/PLATELET
Basophils Absolute: 0 10*3/uL (ref 0.0–0.1)
Basophils Relative: 0.7 % (ref 0.0–3.0)
Eosinophils Absolute: 0.4 10*3/uL (ref 0.0–0.7)
Eosinophils Relative: 7.4 % — ABNORMAL HIGH (ref 0.0–5.0)
HCT: 31.7 % — ABNORMAL LOW (ref 36.0–46.0)
Hemoglobin: 10.4 g/dL — ABNORMAL LOW (ref 12.0–15.0)
Lymphocytes Relative: 20.8 % (ref 12.0–46.0)
Lymphs Abs: 1.1 10*3/uL (ref 0.7–4.0)
MCHC: 32.6 g/dL (ref 30.0–36.0)
MCV: 91.2 fl (ref 78.0–100.0)
Monocytes Absolute: 0.7 10*3/uL (ref 0.1–1.0)
Monocytes Relative: 12.4 % — ABNORMAL HIGH (ref 3.0–12.0)
Neutro Abs: 3.1 10*3/uL (ref 1.4–7.7)
Neutrophils Relative %: 58.7 % (ref 43.0–77.0)
Platelets: 247 10*3/uL (ref 150.0–400.0)
RBC: 3.48 Mil/uL — ABNORMAL LOW (ref 3.87–5.11)
RDW: 15.7 % — ABNORMAL HIGH (ref 11.5–15.5)
WBC: 5.3 10*3/uL (ref 4.0–10.5)

## 2018-02-21 LAB — BASIC METABOLIC PANEL
BUN: 28 mg/dL — ABNORMAL HIGH (ref 6–23)
CO2: 26 mEq/L (ref 19–32)
Calcium: 9.1 mg/dL (ref 8.4–10.5)
Chloride: 107 mEq/L (ref 96–112)
Creatinine, Ser: 1.14 mg/dL (ref 0.40–1.20)
GFR: 48.59 mL/min — ABNORMAL LOW (ref >60.00)
Glucose, Bld: 90 mg/dL (ref 70–99)
Potassium: 4.4 mEq/L (ref 3.5–5.1)
Sodium: 140 mEq/L (ref 135–145)

## 2018-02-21 MED ORDER — ATORVASTATIN CALCIUM 80 MG PO TABS: 80.0000 mg | ORAL_TABLET | Freq: Every day | ORAL | 3 refills | Status: DC

## 2018-02-21 MED ORDER — METOPROLOL SUCCINATE ER 25 MG PO TB24: 75.0000 mg | ORAL_TABLET | Freq: Every day | ORAL | 3 refills | Status: DC

## 2018-02-21 MED ORDER — PANTOPRAZOLE SODIUM 40 MG PO TBEC: 40.0000 mg | DELAYED_RELEASE_TABLET | Freq: Every day | ORAL | 3 refills | Status: DC

## 2018-02-21 MED ORDER — APIXABAN 5 MG PO TABS: 5.0000 mg | ORAL_TABLET | Freq: Two times a day (BID) | ORAL | 3 refills | Status: DC

## 2018-02-21 MED ORDER — DULOXETINE HCL 60 MG PO CPEP: ORAL_CAPSULE | ORAL | 3 refills | Status: DC

## 2018-02-21 MED ORDER — LORAZEPAM 1 MG PO TABS: 1.0000 mg | ORAL_TABLET | Freq: Every day | ORAL | 1 refills | Status: DC

## 2018-02-21 NOTE — Assessment & Plan Note (Signed)
Low dose Coreg, Eliquis

## 2018-02-21 NOTE — Assessment & Plan Note (Signed)
Eliquis 

## 2018-02-21 NOTE — Assessment & Plan Note (Signed)
STENT 2019

## 2018-02-21 NOTE — Assessment & Plan Note (Signed)
No relapse 

## 2018-02-21 NOTE — Assessment & Plan Note (Signed)
Labs

## 2018-02-21 NOTE — Progress Notes (Signed)
Subjective:  Patient ID: Hailey [name], female    DOB: [birth year]  Age: 83 y.o. MRN: [id number]  CC: No chief complaint on file.   HPI Hailey [name] presents for 6 mo f/u - CAD (s/p STENT), R knee hematoma and wound, anxiety, GERD f/u  Outpatient Medications Prior to Visit  Medication Sig Dispense Refill  . acetaminophen (TYLENOL) 500 MG tablet Take 1,000 mg by mouth every 6 (six) hours as needed (Coated or capsules).    Marland Kitchen apixaban (ELIQUIS) 5 MG TABS tablet Take 1 tablet (5 mg total) by mouth 2 (two) times daily. 180 tablet 3  . atorvastatin (LIPITOR) 80 MG tablet TAKE 1 TABLET AT 6PM. 90 tablet 3  . DULoxetine (CYMBALTA) 60 MG capsule TAKE (1) CAPSULE DAILY. 90 capsule 1  . ketotifen (ZADITOR) 0.025 % ophthalmic solution Place 1 drop into both eyes daily.    Marland Kitchen LORazepam (ATIVAN) 1 MG tablet Take 1 mg by mouth at bedtime.     . metoprolol succinate (TOPROL-XL) 25 MG 24 hr tablet Take 3 tablets (75 mg total) by mouth daily. Take with or immediately following a meal. 270 tablet 3  . nitroGLYCERIN (NITROSTAT) 0.4 MG SL tablet Place 0.4 mg under the tongue every 5 (five) minutes as needed for chest pain (Call 911 at 3rd dose within 15 minutes.).    Marland Kitchen pantoprazole (PROTONIX) 40 MG tablet TAKE 1 TABLET BY MOUTH DAILY. 90 tablet 3  . sodium chloride (OCEAN) 0.65 % SOLN nasal spray Place 1 spray into both nostrils as needed for congestion.     No facility-administered medications prior to visit.     ROS: Review of Systems  Constitutional: Negative for activity change, appetite change, chills, fatigue and unexpected weight change.  HENT: Negative for congestion, mouth sores and sinus pressure.   Eyes: Negative for visual disturbance.  Respiratory: Negative for cough and chest tightness.   Gastrointestinal: Negative for abdominal pain and nausea.  Genitourinary: Negative for difficulty urinating, frequency and vaginal pain.  Musculoskeletal: Positive for arthralgias. Negative for back  pain and gait problem.  Skin: Negative for pallor and rash.  Neurological: Negative for dizziness, tremors, weakness, numbness and headaches.  Hematological: Bruises/bleeds easily.  Psychiatric/Behavioral: Negative for confusion and sleep disturbance. The patient is nervous/anxious.     Objective:  BP 124/82 (BP Location: Left Arm, Patient Position: Sitting, Cuff Size: Normal)   Pulse (!) 52   Temp 97.9 F (36.6 C) (Oral)   Ht 5\' 4"  (1.626 m)   Wt 166 lb (75.3 kg)   SpO2 98%   BMI 28.49 kg/m   BP Readings from Last 3 Encounters:  02/21/18 124/82  01/12/18 131/83  01/01/18 122/76    Wt Readings from Last 3 Encounters:  02/21/18 166 lb (75.3 kg)  02/13/18 171 lb (77.6 kg)  01/30/18 171 lb (77.6 kg)    Physical Exam  Constitutional: She appears well-developed. No distress.  HENT:  Head: Normocephalic.  Right Ear: External ear normal.  Left Ear: External ear normal.  Nose: Nose normal.  Mouth/Throat: Oropharynx is clear and moist.  Eyes: Pupils are equal, round, and reactive to light. Conjunctivae are normal. Right eye exhibits no discharge. Left eye exhibits no discharge.  Neck: Normal range of motion. Neck supple. No JVD present. No tracheal deviation present. No thyromegaly present.  Cardiovascular: Normal rate, regular rhythm and normal heart sounds.  Pulmonary/Chest: No stridor. No respiratory distress. She has no wheezes.  Abdominal: Soft. Bowel sounds are normal. She exhibits no  distension and no mass. There is no tenderness. There is no rebound and no guarding.  Musculoskeletal: She exhibits no edema or tenderness.  Lymphadenopathy:    She has no cervical adenopathy.  Neurological: She displays normal reflexes. No cranial nerve deficit. She exhibits normal muscle tone. Coordination normal.  Skin: No rash noted. No erythema.  Psychiatric: She has a normal mood and affect. Her behavior is normal. Judgment and thought content normal.  R knee scar bruises  Lab  Results  Component Value Date   WBC 8.7 12/28/2017   HGB 10.0 (L) 12/28/2017   HCT 30.7 (L) 12/28/2017   PLT 249 12/28/2017   GLUCOSE 98 12/28/2017   CHOL 171 11/26/2016   TRIG 99 11/26/2016   HDL 90 11/26/2016   LDLDIRECT 89.1 06/29/2013   LDLCALC 61 11/26/2016   ALT 14 12/24/2017   AST 19 12/24/2017   NA 136 12/28/2017   K 3.8 12/28/2017   CL 107 12/28/2017   CREATININE 0.98 12/28/2017   BUN 14 12/28/2017   CO2 22 12/28/2017   TSH 2.11 04/29/2015   INR 1.22 01/10/2018   HGBA1C 5.6 07/04/2014    No results found.  Assessment & Plan:   There are no diagnoses linked to this encounter.   No orders of the defined types were placed in this encounter.    Follow-up: No follow-ups on file.  Walker Kehr, MD

## 2018-02-21 NOTE — Assessment & Plan Note (Signed)
Labs Coreg

## 2018-02-28 ENCOUNTER — Ambulatory Visit: Payer: Self-pay | Admitting: Internal Medicine

## 2018-03-24 ENCOUNTER — Other Ambulatory Visit: Payer: Self-pay | Admitting: Interventional Cardiology

## 2018-03-24 NOTE — Telephone Encounter (Signed)
Pt is a 83 yr old female who last saw Dr Tamala Julian on 01/01/18. Her weight on 02/21/18 was 75.3Kg and SCr was 1.14 same date. Will refill Eliquis 5mg  BID.

## 2018-03-25 ENCOUNTER — Other Ambulatory Visit: Payer: Self-pay

## 2018-03-25 MED ORDER — CLOBETASOL PROP EMOLLIENT BASE 0.05 % EX CREA: TOPICAL_CREAM | CUTANEOUS | 2 refills | Status: DC

## 2018-03-25 NOTE — Telephone Encounter (Signed)
At her 11/24/15 visit you wrote "Has a history of lichen sclerosis and has been using her Temovate nightly but this does not seem to help with the vulvar irritation."

## 2018-04-14 DIAGNOSIS — H01111 Allergic dermatitis of right upper eyelid: Secondary | ICD-10-CM | POA: Diagnosis not present

## 2018-04-18 DIAGNOSIS — H00022 Hordeolum internum right lower eyelid: Secondary | ICD-10-CM | POA: Diagnosis not present

## 2018-05-15 DIAGNOSIS — H00011 Hordeolum externum right upper eyelid: Secondary | ICD-10-CM | POA: Diagnosis not present

## 2018-05-22 ENCOUNTER — Other Ambulatory Visit (INDEPENDENT_AMBULATORY_CARE_PROVIDER_SITE_OTHER): Payer: Medicare Other

## 2018-05-22 ENCOUNTER — Ambulatory Visit (INDEPENDENT_AMBULATORY_CARE_PROVIDER_SITE_OTHER): Payer: Medicare Other | Admitting: Internal Medicine

## 2018-05-22 ENCOUNTER — Other Ambulatory Visit: Payer: Self-pay | Admitting: Internal Medicine

## 2018-05-22 ENCOUNTER — Encounter: Payer: Self-pay | Admitting: Internal Medicine

## 2018-05-22 VITALS — BP 126/80 | HR 86 | Temp 98.2°F | Ht 64.0 in | Wt 159.0 lb

## 2018-05-22 DIAGNOSIS — I480 Paroxysmal atrial fibrillation: Secondary | ICD-10-CM

## 2018-05-22 DIAGNOSIS — R1084 Generalized abdominal pain: Secondary | ICD-10-CM | POA: Diagnosis not present

## 2018-05-22 DIAGNOSIS — R109 Unspecified abdominal pain: Secondary | ICD-10-CM | POA: Insufficient documentation

## 2018-05-22 DIAGNOSIS — Z23 Encounter for immunization: Secondary | ICD-10-CM

## 2018-05-22 DIAGNOSIS — I10 Essential (primary) hypertension: Secondary | ICD-10-CM | POA: Diagnosis not present

## 2018-05-22 LAB — URINALYSIS, ROUTINE W REFLEX MICROSCOPIC
Bilirubin Urine: NEGATIVE
Ketones, ur: NEGATIVE
Nitrite: NEGATIVE
Specific Gravity, Urine: 1.02 (ref 1.000–1.030)
Total Protein, Urine: NEGATIVE
Urine Glucose: NEGATIVE
Urobilinogen, UA: 0.2 (ref 0.0–1.0)
pH: 6 (ref 5.0–8.0)

## 2018-05-22 LAB — CBC WITH DIFFERENTIAL/PLATELET
Basophils Absolute: 0 10*3/uL (ref 0.0–0.1)
Basophils Relative: 0.6 % (ref 0.0–3.0)
Eosinophils Absolute: 0.5 10*3/uL (ref 0.0–0.7)
Eosinophils Relative: 7.5 % — ABNORMAL HIGH (ref 0.0–5.0)
HCT: 34.8 % — ABNORMAL LOW (ref 36.0–46.0)
Hemoglobin: 11.5 g/dL — ABNORMAL LOW (ref 12.0–15.0)
Lymphocytes Relative: 16.1 % (ref 12.0–46.0)
Lymphs Abs: 1 10*3/uL (ref 0.7–4.0)
MCHC: 33.2 g/dL (ref 30.0–36.0)
MCV: 89.2 fl (ref 78.0–100.0)
Monocytes Absolute: 0.7 10*3/uL (ref 0.1–1.0)
Monocytes Relative: 11.1 % (ref 3.0–12.0)
Neutro Abs: 4.2 10*3/uL (ref 1.4–7.7)
Neutrophils Relative %: 64.7 % (ref 43.0–77.0)
Platelets: 239 10*3/uL (ref 150.0–400.0)
RBC: 3.9 Mil/uL (ref 3.87–5.11)
RDW: 18.6 % — ABNORMAL HIGH (ref 11.5–15.5)
WBC: 6.5 10*3/uL (ref 4.0–10.5)

## 2018-05-22 LAB — HEPATIC FUNCTION PANEL
ALT: 13 U/L (ref 0–35)
AST: 23 U/L (ref 0–37)
Albumin: 4 g/dL (ref 3.5–5.2)
Alkaline Phosphatase: 68 U/L (ref 39–117)
Bilirubin, Direct: 0.2 mg/dL (ref 0.0–0.3)
Total Bilirubin: 0.7 mg/dL (ref 0.2–1.2)
Total Protein: 6.8 g/dL (ref 6.0–8.3)

## 2018-05-22 LAB — BASIC METABOLIC PANEL
BUN: 27 mg/dL — ABNORMAL HIGH (ref 6–23)
CO2: 26 mEq/L (ref 19–32)
Calcium: 9.3 mg/dL (ref 8.4–10.5)
Chloride: 105 mEq/L (ref 96–112)
Creatinine, Ser: 1.35 mg/dL — ABNORMAL HIGH (ref 0.40–1.20)
GFR: 39.95 mL/min — ABNORMAL LOW (ref >60.00)
Glucose, Bld: 82 mg/dL (ref 70–99)
Potassium: 4.4 mEq/L (ref 3.5–5.1)
Sodium: 139 mEq/L (ref 135–145)

## 2018-05-22 LAB — LIPASE: Lipase: 36 U/L (ref 11.0–59.0)

## 2018-05-22 MED ORDER — CEFUROXIME AXETIL 250 MG PO TABS: 250.0000 mg | ORAL_TABLET | Freq: Two times a day (BID) | ORAL | 0 refills | Status: AC

## 2018-05-22 NOTE — Assessment & Plan Note (Signed)
Coreg and Eliquis

## 2018-05-22 NOTE — Addendum Note (Signed)
Addended by: Karren Cobble on: 05/22/2018 10:38 AM   Modules accepted: Orders

## 2018-05-22 NOTE — Assessment & Plan Note (Addendum)
Chronic - IBS-D FDGuard, IB Guard Probiotic Abd [place] Labs Hold Lipitor x 2 wks Activated charcoal tablets for gas

## 2018-05-22 NOTE — Assessment & Plan Note (Signed)
BP Readings from Last 3 Encounters:  05/22/18 126/80  02/21/18 124/82  01/12/18 131/83

## 2018-05-22 NOTE — Patient Instructions (Signed)
Activated charcoal tablets for gas

## 2018-05-22 NOTE — Progress Notes (Signed)
Subjective:  Patient ID: Hailey [name], female    DOB: [birth year]  Age: 83 y.o. MRN: [id number]  CC: No chief complaint on file.   HPI Hailey [name] presents for diarrhea episodes, stomach aches in pm's, cramps x weeks. C/o nausea... Lost wt  Outpatient Medications Prior to Visit  Medication Sig Dispense Refill  . acetaminophen (TYLENOL) 500 MG tablet Take 1,000 mg by mouth every 6 (six) hours as needed (Coated or capsules).    Marland Kitchen atorvastatin (LIPITOR) 80 MG tablet Take 1 tablet (80 mg total) by mouth daily at 6 PM. 90 tablet 3  . Clobetasol Prop Emollient Base 0.05 % emollient cream Apply nightly as directed. 30 g 2  . DULoxetine (CYMBALTA) 60 MG capsule TAKE (1) CAPSULE DAILY. 90 capsule 3  . ELIQUIS 5 MG TABS tablet TAKE 1 TABLET BY MOUTH TWICE DAILY. 180 tablet 3  . ketotifen (ZADITOR) 0.025 % ophthalmic solution Place 1 drop into both eyes daily.    Marland Kitchen LORazepam (ATIVAN) 1 MG tablet Take 1 tablet (1 mg total) by mouth at bedtime. 180 tablet 1  . metoprolol succinate (TOPROL-XL) 25 MG 24 hr tablet Take 3 tablets (75 mg total) by mouth daily. Take with or immediately following a meal. 270 tablet 3  . nitroGLYCERIN (NITROSTAT) 0.4 MG SL tablet Place 0.4 mg under the tongue every 5 (five) minutes as needed for chest pain (Call 911 at 3rd dose within 15 minutes.).    Marland Kitchen pantoprazole (PROTONIX) 40 MG tablet Take 1 tablet (40 mg total) by mouth daily. 90 tablet 3  . sodium chloride (OCEAN) 0.65 % SOLN nasal spray Place 1 spray into both nostrils as needed for congestion.     No facility-administered medications prior to visit.     ROS: Review of Systems  Constitutional: Positive for fatigue and unexpected weight change. Negative for activity change, appetite change and chills.  HENT: Negative for congestion, mouth sores and sinus pressure.   Eyes: Negative for visual disturbance.  Respiratory: Negative for cough and chest tightness.   Gastrointestinal: Positive for abdominal pain  and diarrhea. Negative for nausea.  Genitourinary: Negative for difficulty urinating, frequency and vaginal pain.  Musculoskeletal: Positive for arthralgias and gait problem. Negative for back pain.  Skin: Negative for pallor and rash.  Neurological: Negative for dizziness, tremors, weakness, numbness and headaches.  Psychiatric/Behavioral: Negative for confusion and sleep disturbance. The patient is nervous/anxious.     Objective:  BP 126/80 (BP Location: Right Arm, Patient Position: Sitting, Cuff Size: Normal)   Pulse 86   Temp 98.2 F (36.8 C) (Oral)   Ht 5\' 4"  (1.626 m)   Wt 159 lb (72.1 kg)   SpO2 97%   BMI 27.29 kg/m   BP Readings from Last 3 Encounters:  05/22/18 126/80  02/21/18 124/82  01/12/18 131/83    Wt Readings from Last 3 Encounters:  05/22/18 159 lb (72.1 kg)  02/21/18 166 lb (75.3 kg)  02/13/18 171 lb (77.6 kg)    Physical Exam  Constitutional: She appears well-developed. No distress.  HENT:  Head: Normocephalic.  Right Ear: External ear normal.  Left Ear: External ear normal.  Nose: Nose normal.  Mouth/Throat: Oropharynx is clear and moist.  Eyes: Pupils are equal, round, and reactive to light. Conjunctivae are normal. Right eye exhibits no discharge. Left eye exhibits no discharge.  Neck: Normal range of motion. Neck supple. No JVD present. No tracheal deviation present. No thyromegaly present.  Cardiovascular: Normal rate, regular rhythm and normal  heart sounds.  Pulmonary/Chest: No stridor. No respiratory distress. She has no wheezes.  Abdominal: Soft. Bowel sounds are normal. She exhibits no distension and no mass. There is no tenderness. There is no rebound and no guarding.  Musculoskeletal: She exhibits no edema or tenderness.  Lymphadenopathy:    She has no cervical adenopathy.  Neurological: She displays normal reflexes. No cranial nerve deficit. She exhibits normal muscle tone. Coordination normal.  Skin: No rash noted. No erythema.    Psychiatric: She has a normal mood and affect. Her behavior is normal. Judgment and thought content normal.    Lab Results  Component Value Date   WBC 5.3 02/21/2018   HGB 10.4 (L) 02/21/2018   HCT 31.7 (L) 02/21/2018   PLT 247.0 02/21/2018   GLUCOSE 90 02/21/2018   CHOL 171 11/26/2016   TRIG 99 11/26/2016   HDL 90 11/26/2016   LDLDIRECT 89.1 06/29/2013   LDLCALC 61 11/26/2016   ALT 14 12/24/2017   AST 19 12/24/2017   NA 140 02/21/2018   K 4.4 02/21/2018   CL 107 02/21/2018   CREATININE 1.14 02/21/2018   BUN 28 (H) 02/21/2018   CO2 26 02/21/2018   TSH 2.11 04/29/2015   INR 1.22 01/10/2018   HGBA1C 5.6 07/04/2014    No results found.  Assessment & Plan:   There are no diagnoses linked to this encounter.   No orders of the defined types were placed in this encounter.    Follow-up: No follow-ups on file.  Walker Kehr, MD

## 2018-05-23 NOTE — Addendum Note (Signed)
Addended by: Karren Cobble on: 05/23/2018 02:35 PM   Modules accepted: Orders

## 2018-05-26 DIAGNOSIS — H0011 Chalazion right upper eyelid: Secondary | ICD-10-CM | POA: Diagnosis not present

## 2018-06-02 ENCOUNTER — Ambulatory Visit
Admission: RE | Admit: 2018-06-02 | Discharge: 2018-06-02 | Disposition: A | Discharge status: Home / Self Care | Payer: Medicare Other | Source: Ambulatory Visit | Attending: Internal Medicine | Admitting: Internal Medicine

## 2018-06-02 DIAGNOSIS — N281 Cyst of kidney, acquired: Secondary | ICD-10-CM | POA: Diagnosis not present

## 2018-06-11 DIAGNOSIS — H0011 Chalazion right upper eyelid: Secondary | ICD-10-CM | POA: Diagnosis not present

## 2018-06-11 DIAGNOSIS — H0289 Other specified disorders of eyelid: Secondary | ICD-10-CM | POA: Diagnosis not present

## 2018-06-23 ENCOUNTER — Encounter: Payer: Self-pay | Admitting: Internal Medicine

## 2018-06-24 ENCOUNTER — Telehealth: Payer: Self-pay | Admitting: Interventional Cardiology

## 2018-06-24 NOTE — Telephone Encounter (Signed)
New Message    Pt c/o of Chest Pain: STAT if CP now or developed within 24 hours  1. Are you having CP right now? No pain now just some discomfort. Had some pain prior to taking the nitroglycerin   2. Are you experiencing any other symptoms (ex. SOB, nausea, vomiting, sweating)? No symptoms  3. How long have you been experiencing CP? This has been occurring since this weekend   4. Is your CP continuous or coming and going? Coming and going   5. Have you taken Nitroglycerin? yes ?

## 2018-06-24 NOTE — Telephone Encounter (Signed)
Spoke with pt and scheduled her to see Dr. Tamala Julian tomorrow at [phone].  Pt appreciative for call.

## 2018-06-24 NOTE — Telephone Encounter (Signed)
Spoke with pt and she states that Saturday night she developed some CP that felt like indigestion.  She thought is was indigestion since she was out of town and was eating differently than normal.  States had just a few sharp pains and then it was gone.  Lasted about 2 mins.  Had no Nitro with her at that time.  Pains came back again last night.  Pt took one Nitro and pain resolved.  Denies any other sx other than feeling fatigued right after.  Attempted to schedule pt with PA or NP in the next couple of days.  Pt prefers to see Dr. Tamala Julian or would see APP if Dr. Tamala Julian is also in the office.  Advised I would speak to Dr. Tamala Julian this afternoon when he gets here.  Pt appreciative for call.

## 2018-06-25 ENCOUNTER — Encounter: Payer: Self-pay | Admitting: Interventional Cardiology

## 2018-06-25 ENCOUNTER — Ambulatory Visit: Payer: Medicare Other | Admitting: Interventional Cardiology

## 2018-06-25 VITALS — BP 138/90 | HR 66 | Ht 64.0 in | Wt 160.8 lb

## 2018-06-25 DIAGNOSIS — I4510 Unspecified right bundle-branch block: Secondary | ICD-10-CM

## 2018-06-25 DIAGNOSIS — R071 Chest pain on breathing: Secondary | ICD-10-CM | POA: Diagnosis not present

## 2018-06-25 DIAGNOSIS — R55 Syncope and collapse: Secondary | ICD-10-CM

## 2018-06-25 DIAGNOSIS — H0011 Chalazion right upper eyelid: Secondary | ICD-10-CM | POA: Diagnosis not present

## 2018-06-25 DIAGNOSIS — I10 Essential (primary) hypertension: Secondary | ICD-10-CM | POA: Diagnosis not present

## 2018-06-25 DIAGNOSIS — I480 Paroxysmal atrial fibrillation: Secondary | ICD-10-CM

## 2018-06-25 NOTE — Progress Notes (Signed)
Cardiology Office Note:    Date:  06/25/2018   ID:  JOELENE BARRIERE, DOB [birth year], MRN [id number]  PCP:  Cassandria Anger, MD  Cardiologist:  Sinclair Grooms, MD   Referring MD: Cassandria Anger, MD   Chief Complaint  Patient presents with  . Atrial Fibrillation     History of Present Illness:    Hailey [name] is a 83 y.o. female with a hx of PSVT, paroxysmal atrial fibrillationonchronic anticoagulation, CAD with PCI DES of RCA 2018, and hypertension.  Seconds of sharp right parasternal discomfort that lasts seconds and then resolves.  She has had multiple episodes.  States that "nitroglycerin made it go away".  What she means by that is out on one occasion she took nitroglycerin after 3 stabbing episodes of discomfort and she had no more after that.  The discomfort is not exertion related.  The discomfort is not associated with dyspnea or diaphoresis.  Past Medical History:  Diagnosis Date  . Acute blood loss anemia 12/25/2017  . Allergic rhinitis   . Anxiety   . Arthritis    "my whole spine" (07/01/2017)  . Atrial fibrillation ([place])   . Bradycardia with 41-50 beats per minute 12/27/2017  . Cellulitis of leg, right with large prepatella hematoma and open wounds 12/26/2017  . CKD (chronic kidney disease) stage 2, GFR 60-89 ml/min   . Coronary artery disease    10/18 PCI/DES to p/m LCx with cutting balloon to mLcx  . Diverticulosis of colon   . GERD (gastroesophageal reflux disease)   . Hip bursitis 2010   Dr Para March, Post op seroma  . History of colon polyps   . HTN (hypertension)   . IBS (irritable bowel syndrome)    constipation predominant - Dr Earlean Shawl  . Lichen sclerosus   . Osteopenia 11/2016   T score -2.0 FRAX 15%/4.3%  . PAC (premature atrial contraction)    Symptomatiic  . Right bundle branch block (RBBB) on electrocardiogram (ECG) 12/27/2017  . Scoliosis   . SVT (supraventricular tachycardia) ([place])    brief history    Past Surgical History:    Procedure Laterality Date  . ANTERIOR AND POSTERIOR VAGINAL REPAIR  01/2002   Archie Endo 01/23/2011  . APPENDECTOMY  1948  . CARDIAC CATHETERIZATION  06/26/2017  . CORONARY ANGIOPLASTY WITH STENT PLACEMENT  07/01/2017  . CORONARY ATHERECTOMY N/A 07/01/2017   Procedure: CORONARY ATHERECTOMY;  Surgeon: Belva Crome, MD;  Location: [facility];  Service: Cardiovascular;  Laterality: N/A;  . CORONARY STENT INTERVENTION N/A 07/01/2017   Procedure: CORONARY STENT INTERVENTION;  Surgeon: Belva Crome, MD;  Location: [facility];  Service: Cardiovascular;  Laterality: N/A;  . HAMMER TOE SURGERY    . HEMORRHOID BANDING    . HIP SURGERY Left 04/2009   hip examination under anesthesia followed by greater trochanteric bursectomy; iliotibial band tenotomy/notes 01/20/2011  . I&D EXTREMITY Right 01/10/2018   Procedure: IRRIGATION AND DEBRIDEMENT RIGHT KNEE, APPLY WOUND VAC;  Surgeon: Newt Minion, MD;  Location: [facility];  Service: Orthopedics;  Laterality: Right;  . KNEE BURSECTOMY Right 04/2009   Archie Endo 01/09/2011  . LEFT HEART CATH AND CORONARY ANGIOGRAPHY N/A 06/26/2017   Procedure: LEFT HEART CATH AND CORONARY ANGIOGRAPHY;  Surgeon: Belva Crome, MD;  Location: [facility];  Service: Cardiovascular;  Laterality: N/A;  . PUBOVAGINAL SLING  01/2002   Archie Endo 01/23/2011  . REDUCTION MAMMAPLASTY    . TEMPORARY PACEMAKER N/A 07/01/2017  Procedure: TEMPORARY PACEMAKER;  Surgeon: Belva Crome, MD;  Location: [facility];  Service: Cardiovascular;  Laterality: N/A;  . VAGINAL HYSTERECTOMY  01/2002   Vaginal hysterectomy, bilateral salpingo-oophorectomy/notes 01/23/2011    Current Medications: Current Meds  Medication Sig  . acetaminophen (TYLENOL) 500 MG tablet Take 1,000 mg by mouth every 6 (six) hours as needed (Coated or capsules).  Marland Kitchen atorvastatin (LIPITOR) 80 MG tablet Take 1 tablet (80 mg total) by mouth daily at 6 PM.  . Clobetasol Prop Emollient Base 0.05 % emollient  cream Apply nightly as directed.  . DULoxetine (CYMBALTA) 60 MG capsule Take 60 mg by mouth daily.  Marland Kitchen ELIQUIS 5 MG TABS tablet TAKE 1 TABLET BY MOUTH TWICE DAILY.  Marland Kitchen ketotifen (ZADITOR) 0.025 % ophthalmic solution Place 1 drop into both eyes daily.  Marland Kitchen LORazepam (ATIVAN) 1 MG tablet Take 1 tablet (1 mg total) by mouth at bedtime.  . metoprolol succinate (TOPROL-XL) 25 MG 24 hr tablet Take 3 tablets (75 mg total) by mouth daily. Take with or immediately following a meal.  . nitroGLYCERIN (NITROSTAT) 0.4 MG SL tablet Place 0.4 mg under the tongue every 5 (five) minutes as needed for chest pain (Call 911 at 3rd dose within 15 minutes.).  Marland Kitchen pantoprazole (PROTONIX) 40 MG tablet Take 1 tablet (40 mg total) by mouth daily.  . sodium chloride (OCEAN) 0.65 % SOLN nasal spray Place 1 spray into both nostrils as needed for congestion.     Allergies:   Macrobid [nitrofurantoin monohyd macro]; Meloxicam; Digoxin and related; and Sulfamethoxazole-trimethoprim   Social History   Socioeconomic History  . Marital status: Married    Spouse name: Not on file  . Number of children: 2  . Years of education: Not on file  . Highest education level: Not on file  Occupational History  . Occupation: Patent attorney: RETIRED  Social Needs  . Financial resource strain: Not on file  . Food insecurity:    Worry: Not on file    Inability: Not on file  . Transportation needs:    Medical: Not on file    Non-medical: Not on file  Tobacco Use  . Smoking status: Former Smoker    Packs/day: 0.25    Years: 28.00    Pack years: 7.00    Types: Cigarettes    Last attempt to quit: 1981    Years since quitting: 38.8  . Smokeless tobacco: Never Used  Substance and Sexual Activity  . Alcohol use: Yes    Alcohol/week: 21.0 standard drinks    Types: 7 Standard drinks or equivalent, 14 Shots of liquor per week    Comment: 07/01/2017 "couple shots/day"  . Drug use: No  . Sexual activity: Yes    Birth  control/protection: Surgical    Comment: 1st intercourse 83 yo--Fewer than 5 partners  Lifestyle  . Physical activity:    Days per week: Not on file    Minutes per session: Not on file  . Stress: Not on file  Relationships  . Social connections:    Talks on phone: Not on file    Gets together: Not on file    Attends religious service: Not on file    Active member of club or organization: Not on file    Attends meetings of clubs or organizations: Not on file    Relationship status: Not on file  Other Topics Concern  . Not on file  Social History Narrative   Regular Exercise -  YES           Family History: The patient's family history includes Cancer in her brother, father, and son; Colon cancer in her mother; Diabetes in her father. There is no history of Heart attack or Stroke.  ROS:   Please see the history of present illness.    Some shortness of breath, back pain, easy bruising, and anxiety.  All other systems reviewed and are negative.  EKGs/Labs/Other Studies Reviewed:    The following studies were reviewed today: No new data  EKG:  EKG is  ordered today.  The ekg ordered today demonstrates atrial fibrillation, right bundle branch block.  No change compared to prior.  Recent Labs: 05/22/2018: ALT 13; BUN 27; Creatinine, Ser 1.35; Hemoglobin 11.5; Platelets 239.0; Potassium 4.4; Sodium 139  Recent Lipid Panel    Component Value Date/Time   CHOL 171 11/26/2016 0844   TRIG 99 11/26/2016 0844   TRIG 183 (H) 07/26/2006 0903   HDL 90 11/26/2016 0844   CHOLHDL 1.9 11/26/2016 0844   CHOLHDL 2.2 07/04/2014 0528   VLDL 19 07/04/2014 0528   LDLCALC 61 11/26/2016 0844   LDLDIRECT 89.1 06/29/2013 1604    Physical Exam:    VS:  BP 138/90   Pulse 66   Ht 5\' 4"  (1.626 m)   Wt 160 lb 12.8 oz (72.9 kg)   BMI 27.60 kg/m     Wt Readings from Last 3 Encounters:  06/25/18 160 lb 12.8 oz (72.9 kg)  05/22/18 159 lb (72.1 kg)  02/21/18 166 lb (75.3 kg)     GEN:  Well  nourished, well developed in no acute distress HEENT: Normal NECK: No JVD. LYMPHATICS: No lymphadenopathy CARDIAC: IIRR, no murmur, no gallop, no  edema. VASCULAR: 2+ bilateral radial pulses.  No bruits. RESPIRATORY:  Clear to auscultation without rales, wheezing or rhonchi  ABDOMEN: Soft, non-tender, non-distended, No pulsatile mass, MUSCULOSKELETAL: No deformity  SKIN: Warm and dry NEUROLOGIC:  Alert and oriented x 3 PSYCHIATRIC:  Normal affect   ASSESSMENT:    1. Chest pain on breathing   2. Essential hypertension   3. SYNCOPE   4. Right bundle branch block (RBBB) on electrocardiogram (ECG)   5. PAF (paroxysmal atrial fibrillation) (HCC)    PLAN:    In order of problems listed above:  1. No further evaluation at this time.  Clinically the discomfort appears to be related to musculoskeletal/neuropathic/inflammatory etiology.  Hopefully it will resolve.  May be related to a long car trip over this past weekend where she drove for over 6 hours.  Perhaps there is some thoracic spine issue with irritation of a subcostal nerve root.  Watchful waiting is recommended. 2. Stable 3. No recurrence 4. Unchanged 5. She now has an apple watch and based on data, appears to be in atrial fibrillation all the time rather than paroxysmal episodes.  Clinical follow-up.  Watchful waiting.  Next office visit in 6 months.  Cancel the October 30 assault office visit.   Medication Adjustments/Labs and Tests Ordered: Current medicines are reviewed at length with the patient today.  Concerns regarding medicines are outlined above.  Orders Placed This Encounter  Procedures  . EKG 12-Lead   No orders of the defined types were placed in this encounter.   Patient Instructions  Your physician recommends that you continue on your current medications as directed. Please refer to the Current Medication list given to you today.   Your physician wants you to follow-up in: 6 MONTHS  WITH DR Blima Singer  will receive a reminder letter in the mail two months in advance. If you don't receive a letter, please call our office to schedule the follow-up appointment.     Signed, Sinclair Grooms, MD  06/25/2018 9:11 AM    [place]

## 2018-06-25 NOTE — Patient Instructions (Addendum)
Your physician recommends that you continue on your current medications as directed. Please refer to the Current Medication list given to you today.    Your physician wants you to follow-up in: 6 MONTHS WITH DR SMITH  You will receive a reminder letter in the mail two months in advance. If you don't receive a letter, please call our office to schedule the follow-up appointment.  

## 2018-07-09 ENCOUNTER — Ambulatory Visit: Payer: Self-pay | Admitting: Interventional Cardiology

## 2018-07-25 ENCOUNTER — Ambulatory Visit: Payer: Self-pay | Admitting: Internal Medicine

## 2018-08-05 DIAGNOSIS — H353111 Nonexudative age-related macular degeneration, right eye, early dry stage: Secondary | ICD-10-CM | POA: Diagnosis not present

## 2018-08-13 ENCOUNTER — Encounter: Payer: Self-pay | Admitting: Gynecology

## 2018-08-13 ENCOUNTER — Ambulatory Visit: Payer: Medicare Other | Admitting: Gynecology

## 2018-08-13 VITALS — BP 122/78

## 2018-08-13 DIAGNOSIS — R300 Dysuria: Secondary | ICD-10-CM

## 2018-08-13 DIAGNOSIS — N898 Other specified noninflammatory disorders of vagina: Secondary | ICD-10-CM | POA: Diagnosis not present

## 2018-08-13 LAB — WET PREP FOR TRICH, YEAST, CLUE

## 2018-08-13 MED ORDER — TERCONAZOLE 0.4 % VA CREA: 1.0000 | TOPICAL_CREAM | Freq: Every day | VAGINAL | 0 refills | Status: DC

## 2018-08-13 NOTE — Patient Instructions (Signed)
Use the vaginal cream nightly for 7 nights to treat the yeast infection.  We will call you if it looks like a urinary tract infection if anything grows on the urine culture

## 2018-08-13 NOTE — Progress Notes (Signed)
    Hailey [name] November 04, 1934 [id number]        83 y.o.  G2P2001 with several weeks of vulvar irritation and itching.  Also some dysuria.  No frequency urgency low back pain fever or chills.  No significant vaginal discharge.  Past medical history,surgical history, problem list, medications, allergies, family history and social history were all reviewed and documented in the EPIC chart.  Directed ROS with pertinent positives and negatives documented in the history of present illness/assessment and plan.  Exam: [name] assistant Vitals:   08/13/18 1607  BP: 122/78   General appearance:  Normal Abdomen soft nontender without masses guarding rebound Pelvic external BUS vagina with atrophic changes.  Mild irritative changes bilaterally inner labia majora.  Without specific lesions or abnormalities.  Scant white discharge.  Bimanual without masses or tenderness.  Assessment/Plan:  83 y.o. G2P2001 with history and exam as above.  Her wet prep does show yeast.  Urine analysis shows moderate bacteriuria but also has 10-20 squamous cells so may be contaminated.  Will treat yeast with Terazol 7 day cream nightly x7 nights.  Will culture urine and treat if it grows out bacteria.  The patient will return if her symptoms persist, worsen or recur.    Anastasio Auerbach MD, 4:19 PM 08/13/2018

## 2018-08-14 ENCOUNTER — Ambulatory Visit (INDEPENDENT_AMBULATORY_CARE_PROVIDER_SITE_OTHER): Payer: Medicare Other | Admitting: Internal Medicine

## 2018-08-14 ENCOUNTER — Encounter: Payer: Self-pay | Admitting: Internal Medicine

## 2018-08-14 VITALS — BP 110/80 | HR 72 | Temp 97.5°F | Ht 64.0 in | Wt 159.0 lb

## 2018-08-14 DIAGNOSIS — B9789 Other viral agents as the cause of diseases classified elsewhere: Secondary | ICD-10-CM

## 2018-08-14 DIAGNOSIS — R059 Cough, unspecified: Secondary | ICD-10-CM

## 2018-08-14 DIAGNOSIS — R05 Cough: Secondary | ICD-10-CM | POA: Diagnosis not present

## 2018-08-14 DIAGNOSIS — J069 Acute upper respiratory infection, unspecified: Secondary | ICD-10-CM | POA: Diagnosis not present

## 2018-08-14 MED ORDER — METHYLPREDNISOLONE ACETATE 40 MG/ML IJ SUSP
40.0000 mg | Freq: Once | INTRAMUSCULAR | Status: AC
  Administered 2018-08-14: 40 mg via INTRAMUSCULAR

## 2018-08-14 MED ORDER — MONTELUKAST SODIUM 10 MG PO TABS: 10.0000 mg | ORAL_TABLET | Freq: Every day | ORAL | 0 refills | Status: DC

## 2018-08-14 NOTE — Patient Instructions (Signed)
We have sent in singulair to take 1 pill at night time. Keep taking the zyrtec in the morning.

## 2018-08-14 NOTE — Progress Notes (Signed)
   Subjective:    Patient ID: Hailey [name], female    DOB: 09-16-1934, 83 y.o.   MRN: [id number]  HPI The patient is an 83 YO female coming in for sore throat symptoms. Started about 4-5 days ago. She is taking zyrtec daily and using saline spray and cough medicine at night time. Overall it is worsening. She denies fevers but some chills. Denies headaches, mild sinus pressure. No ear pressure or pain. Overall worsening. Some cough mostly non-productive. No SOB. Drainage is mostly yellow which concerned her. Lots of fatigue. Normal appetite. Some sore throat which is worsening since onset.   Review of Systems  Constitutional: Positive for activity change, appetite change, chills and fatigue. Negative for fever and unexpected weight change.  HENT: Positive for congestion, postnasal drip, rhinorrhea and sinus pressure. Negative for ear discharge, ear pain, sinus pain, sneezing, sore throat, tinnitus, trouble swallowing and voice change.   Eyes: Negative.   Respiratory: Positive for cough. Negative for chest tightness, shortness of breath and wheezing.   Cardiovascular: Negative.   Gastrointestinal: Negative.   Musculoskeletal: Positive for myalgias.  Neurological: Negative.       Objective:   Physical Exam  Constitutional: She is oriented to person, place, and time. She appears well-developed and well-nourished.  HENT:  Head: Normocephalic and atraumatic.  Oropharynx with redness and clear drainage, nose with swollen turbinates, TMs normal bilaterally  Eyes: EOM are normal.  Neck: Normal range of motion. No thyromegaly present.  Cardiovascular: Normal rate and regular rhythm.  Pulmonary/Chest: Effort normal and breath sounds normal. No respiratory distress. She has no wheezes. She has no rales.  Mild rhonchi which clears with cough  Abdominal: Soft.  Musculoskeletal: She exhibits tenderness.  Lymphadenopathy:    She has no cervical adenopathy.  Neurological: She is alert and oriented  to person, place, and time.  Skin: Skin is warm and dry.   Vitals:   08/14/18 1538  BP: 110/80  Pulse: 72  Temp: (!) 97.5 F (36.4 C)  TempSrc: Oral  SpO2: 90%  Weight: 159 lb (72.1 kg)  Height: 5\' 4"  (1.626 m)      Assessment & Plan:  Depo-medrol 40 mg IM

## 2018-08-15 DIAGNOSIS — B9789 Other viral agents as the cause of diseases classified elsewhere: Principal | ICD-10-CM

## 2018-08-15 DIAGNOSIS — J069 Acute upper respiratory infection, unspecified: Secondary | ICD-10-CM | POA: Insufficient documentation

## 2018-08-15 LAB — URINALYSIS, COMPLETE W/RFL CULTURE
Bilirubin Urine: NEGATIVE
Glucose, UA: NEGATIVE
Hyaline Cast: NONE SEEN /LPF
Ketones, ur: NEGATIVE
Nitrites, Initial: NEGATIVE
Protein, ur: NEGATIVE
RBC / HPF: NONE SEEN /HPF (ref 0–2)
Specific Gravity, Urine: 1.02 (ref 1.001–1.03)
pH: 5.5 (ref 5.0–8.0)

## 2018-08-15 LAB — URINE CULTURE
MICRO NUMBER:: 91457449
SPECIMEN QUALITY:: ADEQUATE

## 2018-08-15 LAB — CULTURE INDICATED

## 2018-08-15 NOTE — Assessment & Plan Note (Signed)
Rx for singulair for drainage, continue zyrtec. Given depo-medrol 40 mg IM in office.

## 2018-08-20 ENCOUNTER — Ambulatory Visit: Payer: Medicare Other | Admitting: Internal Medicine

## 2018-08-20 ENCOUNTER — Encounter: Payer: Self-pay | Admitting: Internal Medicine

## 2018-08-20 VITALS — BP 110/78 | HR 52 | Ht 62.75 in | Wt 161.0 lb

## 2018-08-20 DIAGNOSIS — K219 Gastro-esophageal reflux disease without esophagitis: Secondary | ICD-10-CM | POA: Diagnosis not present

## 2018-08-20 DIAGNOSIS — K5730 Diverticulosis of large intestine without perforation or abscess without bleeding: Secondary | ICD-10-CM

## 2018-08-20 DIAGNOSIS — Z800 Family history of malignant neoplasm of digestive organs: Secondary | ICD-10-CM | POA: Diagnosis not present

## 2018-08-20 DIAGNOSIS — K589 Irritable bowel syndrome without diarrhea: Secondary | ICD-10-CM | POA: Diagnosis not present

## 2018-08-20 NOTE — Progress Notes (Signed)
HISTORY OF PRESENT ILLNESS:  Hailey [name] is a 83 y.o. female with multiple medical problems as listed below including history of atrial fibrillation for which she is on chronic Eliquis.  She presents today at the recommendation of Dr. Lyla Son to establish GI care.  Previous long-term patient of Dr. Richmond Campbell.  Patient has a history of GERD, irritable bowel syndrome, diverticulitis while traveling on a cruise ship in [place] 5 years ago, and a family history of colon cancer in her mother.  Also gastric cancer in her son.  Patient has had multiple colonoscopies in the past without neoplasia.  The most recent colonoscopy was performed Jan 17, 2012.  Again, no neoplasia.  Extensive left-sided diverticulosis and large internal hemorrhoids for which hemorrhoidal banding was proposed.  For her chronic GERD the patient takes pantoprazole 40 mg daily.  On this medication her symptoms are well controlled.  No dysphasia.  In terms of her irritable bowel, she tends to fluctuate between constipation and diarrhea with urgency.  She will take an occasional stool softener or laxative if needed.  No problems with abdominal pain or rectal bleeding.  She will experience bloating and gas.  Review of outside blood work from May 22, 2018 finds unremarkable comprehensive metabolic panel except for creatinine 1.35.  Unremarkable CBC with hemoglobin 11.5.  Abdominal ultrasound was performed June 02, 2018 to evaluate generalized abdominal pain that she was having at that time.  Examination was negative except for incidental left renal cyst.  She was seen by Dr. Alain Marion at that time who recommended IBgard in addition to her imaging and labs.  REVIEW OF SYSTEMS:  All non-GI ROS negative less otherwise stated in the HPI except for arthritis, back pain, cough, fatigue, shortness of breath, skin rash, sore throat  Past Medical History:  Diagnosis Date  . Acute blood loss anemia 12/25/2017  . Allergic rhinitis   .  Anxiety   . Arthritis    "my whole spine" (07/01/2017)  . Atrial fibrillation ([place])   . Bowel obstruction ([place])    in [place]  . Bradycardia with 41-50 beats per minute 12/27/2017  . Cellulitis of leg, right with large prepatella hematoma and open wounds 12/26/2017  . CKD (chronic kidney disease) stage 2, GFR 60-89 ml/min   . Colon polyps   . Coronary artery disease    10/18 PCI/DES to p/m LCx with cutting balloon to mLcx  . Diverticulosis of colon   . GERD (gastroesophageal reflux disease)   . Hip bursitis 2010   Dr Para March, Post op seroma  . History of colon polyps   . HTN (hypertension)   . IBS (irritable bowel syndrome)    constipation predominant - Dr Earlean Shawl  . Lichen sclerosus   . Osteopenia 11/2016   T score -2.0 FRAX 15%/4.3%  . PAC (premature atrial contraction)    Symptomatiic  . Right bundle branch block (RBBB) on electrocardiogram (ECG) 12/27/2017  . Scoliosis   . SVT (supraventricular tachycardia) ([facility])    brief history    Past Surgical History:  Procedure Laterality Date  . ANTERIOR AND POSTERIOR VAGINAL REPAIR  01/2002   Archie Endo 01/23/2011  . APPENDECTOMY  1948  . CARDIAC CATHETERIZATION  06/26/2017  . CORONARY ANGIOPLASTY WITH STENT PLACEMENT  07/01/2017  . CORONARY ATHERECTOMY N/A 07/01/2017   Procedure: CORONARY ATHERECTOMY;  Surgeon: Belva Crome, MD;  Location: [facility];  Service: Cardiovascular;  Laterality: N/A;  . CORONARY STENT INTERVENTION N/A 07/01/2017   Procedure: CORONARY  STENT INTERVENTION;  Surgeon: Belva Crome, MD;  Location: [facility];  Service: Cardiovascular;  Laterality: N/A;  . HAMMER TOE SURGERY    . HEMORRHOID BANDING    . HIP SURGERY Left 04/2009   hip examination under anesthesia followed by greater trochanteric bursectomy; iliotibial band tenotomy/notes 01/20/2011  . I&D EXTREMITY Right 01/10/2018   Procedure: IRRIGATION AND DEBRIDEMENT RIGHT KNEE, APPLY WOUND VAC;  Surgeon: Newt Minion, MD;  Location: [facility];   Service: Orthopedics;  Laterality: Right;  . KNEE BURSECTOMY Right 04/2009   Archie Endo 01/09/2011  . LEFT HEART CATH AND CORONARY ANGIOGRAPHY N/A 06/26/2017   Procedure: LEFT HEART CATH AND CORONARY ANGIOGRAPHY;  Surgeon: Belva Crome, MD;  Location: [facility];  Service: Cardiovascular;  Laterality: N/A;  . PUBOVAGINAL SLING  01/2002   Archie Endo 01/23/2011  . REDUCTION MAMMAPLASTY    . TEMPORARY PACEMAKER N/A 07/01/2017   Procedure: TEMPORARY PACEMAKER;  Surgeon: Belva Crome, MD;  Location: [facility];  Service: Cardiovascular;  Laterality: N/A;  . VAGINAL HYSTERECTOMY  01/2002   Vaginal hysterectomy, bilateral salpingo-oophorectomy/notes 01/23/2011    Social History Thursa TEKOA HAMOR  reports that she quit smoking about 38 years ago. Her smoking use included cigarettes. She has a 7.00 pack-year smoking history. She has never used smokeless tobacco. She reports that she drinks about 21.0 standard drinks of alcohol per week. She reports that she does not use drugs.  family history includes Colon cancer in her mother; Diabetes in her father; Pancreatic cancer in her brother; Prostate cancer in her brother and father; Stomach cancer in her son.  Allergies  Allergen Reactions  . Macrobid [employer] Macro] Other (See Comments)    Nausea, stomach cramps, fatigue , headache.  . Meloxicam Other (See Comments)    Jittery and headache  . Digoxin And Related     headaches  . Mobic [Meloxicam]   . Sulfamethoxazole-Trimethoprim Nausea Only       PHYSICAL EXAMINATION: Vital signs: BP 110/78 (BP Location: Left Arm, Patient Position: Sitting, Cuff Size: Normal)   Pulse (!) 52   Ht 5' 2.75" (1.594 m) Comment: height measured without shoes  Wt 161 lb (73 kg)   BMI 28.75 kg/m   Constitutional: generally well-appearing, no acute distress Psychiatric: alert and oriented x3, cooperative Eyes: extraocular movements intact, anicteric, conjunctiva pink Mouth: oral pharynx  moist, no lesions Neck: supple no lymphadenopathy Cardiovascular: heart regular rate and rhythm, no murmur Lungs: clear to auscultation bilaterally Abdomen: soft, nontender, nondistended, no obvious ascites, no peritoneal signs, normal bowel sounds, no organomegaly Rectal: Omitted Extremities: no, cyanosis, or lower extremity edema bilaterally Skin: no lesions on visible extremities Neuro: No focal deficits.  Cranial nerves intact  ASSESSMENT:  1.  GERD.  Symptoms controlled with PPI 2.  IBS.  Alternating.  Doing well at this time 3.  Family history of colon cancer in her mother in her 67s.  Multiple negative examinations.  Last examination 2013.  Aged out 4.  Diverticulosis with a history of diverticulitis. 5.  Family history of gastric cancer in her son 50.  Multiple medical problems   PLAN:  1.  Reflux precautions 2.  Continue PPI 3.  IBgard as needed for IBS 4.  High-fiber diet 5.  GI follow-up as needed  45 minutes spent face-to-face with the patient.  Greater than 50% of the time used for counseling regarding her multiple GI diagnoses and management recommendations

## 2018-08-20 NOTE — Patient Instructions (Signed)
Please follow up as needed 

## 2018-08-21 ENCOUNTER — Telehealth: Payer: Self-pay | Admitting: *Deleted

## 2018-08-21 MED ORDER — CLINDAMYCIN PHOSPHATE 2 % VA CREA: 1.0000 | TOPICAL_CREAM | Freq: Every day | VAGINAL | 0 refills | Status: DC

## 2018-08-21 NOTE — Telephone Encounter (Signed)
Patient informed, Rx sent.  

## 2018-08-21 NOTE — Telephone Encounter (Signed)
Patient called to follow up from [place] on 08/13/18 completed Terazol cream x 7 days, patient said not much improvement. Still having vaginal burning, no burning with urination, no discharge, no itching, no odor. Please advise

## 2018-08-21 NOTE — Telephone Encounter (Signed)
Recommend trial of Cleocin vaginal cream nightly x7 nights to treat any bacterial overgrowth.

## 2018-09-09 ENCOUNTER — Telehealth: Payer: Self-pay | Admitting: Interventional Cardiology

## 2018-09-09 NOTE — Telephone Encounter (Signed)
New message     Pt c/o Shortness Of Breath: STAT if SOB developed within the last 24 hours or pt is noticeably SOB on the phone  1. Are you currently SOB (can you hear that pt is SOB on the phone)? no   2. How long have you been experiencing SOB? Few weeks   3. Are you SOB when sitting or when up moving around? Moving around   4. Are you currently experiencing any other symptoms? no

## 2018-09-09 NOTE — Telephone Encounter (Signed)
For a few weeks now, pt has been having SOB when exerting.  States it happens if she climbs several stairs, is doing more strenuous activity at the gym or if she walks a long distance.  This resolves after resting for a couple of minutes.  Denies swelling, CP, lightheadedness, dizziness or other issues.  BP is usually 115-120/70s.  Doesn't check BP often.  Pt denies SOB when sitting or lying down.  Will route to Dr. Tamala Julian for review.

## 2018-09-09 NOTE — Telephone Encounter (Signed)
Continue with exercise.  Observation for the time being, especially if there is no chest discomfort or sense that the heart is racing/out of rhythm.  Give follow-up in another couple weeks.

## 2018-09-09 NOTE — Telephone Encounter (Signed)
Spoke with pt and made her aware of recommendations per Dr. Tamala Julian.  Scheduled appt for 1/14.  Pt verbalized understanding and was in agreement with this plan.

## 2018-09-22 NOTE — Progress Notes (Signed)
Cardiology Office Note:    Date:  09/23/2018   ID:  Hailey [name], DOB [birth year], MRN [id number]  PCP:  Cassandria Anger, MD  Cardiologist:  Sinclair Grooms, MD   Referring MD: Cassandria Anger, MD   Chief Complaint  Patient presents with  . Shortness of Breath  . Coronary Artery Disease    History of Present Illness:    Hailey [name] is a 84 y.o. female with a hx of PSVT, paroxysmal atrial fibrillationonchronic anticoagulation,CAD with PCI DES of RCA 2018,and hypertension.  Abnormal prior myocardial perfusion imaging 2017.  Being seen today as a work in.  Over the past 2 weeks she has noticed increasing exertional dyspnea and fatigue.  Symptoms are somewhat similar to prior when she had difficulty with RCA disease that improved with PCI.  She denies symptoms at rest.  Endurance is decreased.  This is been a relatively gradual onset.  No nitroglycerin use.  She is concerned that current symptoms could reflect reocclusion of her coronary.  Past Medical History:  Diagnosis Date  . Acute blood loss anemia 12/25/2017  . Allergic rhinitis   . Anxiety   . Arthritis    "my whole spine" (07/01/2017)  . Atrial fibrillation ([place])   . Bowel obstruction ([place])    in [place]  . Bradycardia with 41-50 beats per minute 12/27/2017  . Cellulitis of leg, right with large prepatella hematoma and open wounds 12/26/2017  . CKD (chronic kidney disease) stage 2, GFR 60-89 ml/min   . Colon polyps   . Coronary artery disease    10/18 PCI/DES to p/m LCx with cutting balloon to mLcx  . Diverticulosis of colon   . GERD (gastroesophageal reflux disease)   . Hip bursitis 2010   Dr Para March, Post op seroma  . History of colon polyps   . HTN (hypertension)   . IBS (irritable bowel syndrome)    constipation predominant - Dr Earlean Shawl  . Lichen sclerosus   . Osteopenia 11/2016   T score -2.0 FRAX 15%/4.3%  . PAC (premature atrial contraction)    Symptomatiic  . Right bundle branch block  (RBBB) on electrocardiogram (ECG) 12/27/2017  . Scoliosis   . SVT (supraventricular tachycardia) ([place])    brief history    Past Surgical History:  Procedure Laterality Date  . ANTERIOR AND POSTERIOR VAGINAL REPAIR  01/2002   Archie Endo 01/23/2011  . APPENDECTOMY  1948  . CARDIAC CATHETERIZATION  06/26/2017  . CORONARY ANGIOPLASTY WITH STENT PLACEMENT  07/01/2017  . CORONARY ATHERECTOMY N/A 07/01/2017   Procedure: CORONARY ATHERECTOMY;  Surgeon: Belva Crome, MD;  Location: [facility];  Service: Cardiovascular;  Laterality: N/A;  . CORONARY STENT INTERVENTION N/A 07/01/2017   Procedure: CORONARY STENT INTERVENTION;  Surgeon: Belva Crome, MD;  Location: [facility];  Service: Cardiovascular;  Laterality: N/A;  . HAMMER TOE SURGERY    . HEMORRHOID BANDING    . HIP SURGERY Left 04/2009   hip examination under anesthesia followed by greater trochanteric bursectomy; iliotibial band tenotomy/notes 01/20/2011  . I&D EXTREMITY Right 01/10/2018   Procedure: IRRIGATION AND DEBRIDEMENT RIGHT KNEE, APPLY WOUND VAC;  Surgeon: Newt Minion, MD;  Location: [facility];  Service: Orthopedics;  Laterality: Right;  . KNEE BURSECTOMY Right 04/2009   Archie Endo 01/09/2011  . LEFT HEART CATH AND CORONARY ANGIOGRAPHY N/A 06/26/2017   Procedure: LEFT HEART CATH AND CORONARY ANGIOGRAPHY;  Surgeon: Belva Crome, MD;  Location: [facility];  Service: Cardiovascular;  Laterality: N/A;  . PUBOVAGINAL SLING  01/2002   Archie Endo 01/23/2011  . REDUCTION MAMMAPLASTY    . TEMPORARY PACEMAKER N/A 07/01/2017   Procedure: TEMPORARY PACEMAKER;  Surgeon: Belva Crome, MD;  Location: [facility];  Service: Cardiovascular;  Laterality: N/A;  . VAGINAL HYSTERECTOMY  01/2002   Vaginal hysterectomy, bilateral salpingo-oophorectomy/notes 01/23/2011    Current Medications: Current Meds  Medication Sig  . acetaminophen (TYLENOL) 500 MG tablet Take 1,000 mg by mouth every 6 (six) hours as needed (Coated or  capsules).  Marland Kitchen atorvastatin (LIPITOR) 80 MG tablet Take 1 tablet (80 mg total) by mouth daily at 6 PM.  . cetirizine (ZYRTEC) 10 MG tablet Take 10 mg by mouth daily.  . clindamycin (CLEOCIN) 2 % vaginal cream Place 1 Applicatorful vaginally at bedtime. For 7 nights  . Clobetasol Prop Emollient Base 0.05 % emollient cream Apply nightly as directed.  . DULoxetine (CYMBALTA) 60 MG capsule Take 60 mg by mouth daily.  Marland Kitchen ELIQUIS 5 MG TABS tablet TAKE 1 TABLET BY MOUTH TWICE DAILY.  Marland Kitchen LORazepam (ATIVAN) 1 MG tablet Take 1 tablet (1 mg total) by mouth at bedtime.  . metoprolol succinate (TOPROL-XL) 25 MG 24 hr tablet Take 3 tablets (75 mg total) by mouth daily. Take with or immediately following a meal.  . nitroGLYCERIN (NITROSTAT) 0.4 MG SL tablet Place 0.4 mg under the tongue every 5 (five) minutes as needed for chest pain (Call 911 at 3rd dose within 15 minutes.).  Marland Kitchen pantoprazole (PROTONIX) 40 MG tablet Take 1 tablet (40 mg total) by mouth daily.  . Probiotic Product (ALIGN PO) Take 1 capsule by mouth daily.  . sodium chloride (OCEAN) 0.65 % SOLN nasal spray Place 1 spray into both nostrils as needed for congestion.  Marland Kitchen terconazole (TERAZOL 7) 0.4 % vaginal cream Place 1 applicator vaginally at bedtime.     Allergies:   Macrobid [nitrofurantoin monohyd macro]; Meloxicam; Digoxin and related; Mobic [meloxicam]; and Sulfamethoxazole-trimethoprim   Social History   Socioeconomic History  . Marital status: Married    Spouse name: Not on file  . Number of children: 2  . Years of education: Not on file  . Highest education level: Not on file  Occupational History  . Occupation: Patent attorney: RETIRED  Social Needs  . Financial resource strain: Not on file  . Food insecurity:    Worry: Not on file    Inability: Not on file  . Transportation needs:    Medical: Not on file    Non-medical: Not on file  Tobacco Use  . Smoking status: Former Smoker    Packs/day: 0.25    Years: 28.00      Pack years: 7.00    Types: Cigarettes    Last attempt to quit: 1981    Years since quitting: 39.0  . Smokeless tobacco: Never Used  Substance and Sexual Activity  . Alcohol use: Yes    Alcohol/week: 21.0 standard drinks    Types: 7 Standard drinks or equivalent, 14 Shots of liquor per week    Comment: 07/01/2017 "couple shots/day"  . Drug use: No  . Sexual activity: Yes    Birth control/protection: Surgical    Comment: 1st intercourse 84 yo--Fewer than 5 partners  Lifestyle  . Physical activity:    Days per week: Not on file    Minutes per session: Not on file  . Stress: Not on file  Relationships  . Social connections:  Talks on phone: Not on file    Gets together: Not on file    Attends religious service: Not on file    Active member of club or organization: Not on file    Attends meetings of clubs or organizations: Not on file    Relationship status: Not on file  Other Topics Concern  . Not on file  Social History Narrative   Regular Exercise -  YES           Family History: The patient's family history includes Colon cancer in her mother; Diabetes in her father; Pancreatic cancer in her brother; Prostate cancer in her brother and father; Stomach cancer in her son. There is no history of Heart attack or Stroke.  ROS:   Please see the history of present illness.    Back pain, rash, dizziness, and anxiety.  All other systems reviewed and are negative.  EKGs/Labs/Other Studies Reviewed:    The following studies were reviewed today: No recent or new functional/imaging studies..  Last evaluation was coronary angiography 07/01/2017 atherectomy.  EKG:  EKG is performed today and personally reviewed.  Atrial fibrillation, controlled ventricular response, left axis deviation, right bundle branch block.  When compared to the tracing from June 25, 2018, the heart rate is faster.  Recent Labs: 05/22/2018: ALT 13; BUN 27; Creatinine, Ser 1.35; Hemoglobin 11.5;  Platelets 239.0; Potassium 4.4; Sodium 139  Recent Lipid Panel    Component Value Date/Time   CHOL 171 11/26/2016 0844   TRIG 99 11/26/2016 0844   TRIG 183 (H) 07/26/2006 0903   HDL 90 11/26/2016 0844   CHOLHDL 1.9 11/26/2016 0844   CHOLHDL 2.2 07/04/2014 0528   VLDL 19 07/04/2014 0528   LDLCALC 61 11/26/2016 0844   LDLDIRECT 89.1 06/29/2013 1604    Physical Exam:    VS:  BP 108/72   Pulse 69   Ht 5' 2.75" (1.594 m)   Wt 164 lb (74.4 kg)   SpO2 92%   BMI 29.28 kg/m     Wt Readings from Last 3 Encounters:  09/23/18 164 lb (74.4 kg)  08/20/18 161 lb (73 kg)  08/14/18 159 lb (72.1 kg)     GEN: Appears slightly younger than stated age.. No acute distress HEENT: Normal NECK: No JVD. LYMPHATICS: No lymphadenopathy CARDIAC: IIRR.  No murmur, gallop, edema VASCULAR: Pulses are 2+ and symmetric in carotid and radial positions., Bruits are absent in the carotid regions. RESPIRATORY:  Clear to auscultation without rales, wheezing or rhonchi  ABDOMEN: Soft, non-tender, non-distended, No pulsatile mass, MUSCULOSKELETAL: No deformity  SKIN: Warm and dry NEUROLOGIC:  Alert and oriented x 3 PSYCHIATRIC:  Normal affect   ASSESSMENT:    1. Dyspnea on exertion   2. Coronary artery disease involving native coronary artery of native heart with angina pectoris ([place])   3. 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 Permanent atrial fibrillation   6. Chronic anticoagulation   7. CKD (chronic kidney disease), stage II    PLAN:    In order of problems listed above:  1. Possible anginal equivalent.  Had orbital atherectomy followed by stenting of the right coronary in October 2018.  Less than equal to 50% distal RCA as well as 50% mid and distal LAD lesions have been treated medically.  Presenting symptom at that time was exertional dyspnea and fatigue.  Myocardial perfusion imaging will be done to determine if there is any evidence of ischemia which may lead to repeat  cath. 2. Initial presentation with symptomatic high-grade disease in the right coronary was with dyspnea on exertion. 3. Blood pressure control is adequate.  Target 130/80 mmHg. 4. Continue anticoagulation therapy. 5. Atrial fibrillation is confirmed and rate is controlled. 6. Continue Eliquis therapy.  Nuclear scintigraphy will be done to exclude evidence of restenosis or progression of disease in other territories.   Medication Adjustments/Labs and Tests Ordered: Current medicines are reviewed at length with the patient today.  Concerns regarding medicines are outlined above.  Orders Placed This Encounter  Procedures  . MYOCARDIAL PERFUSION IMAGING  . EKG 12-Lead   No orders of the defined types were placed in this encounter.   Patient Instructions  Medication Instructions:  Your physician recommends that you continue on your current medications as directed. Please refer to the Current Medication list given to you today.  If you need a refill on your cardiac medications before your next appointment, please call your pharmacy.   Lab work: None If you have labs (blood work) drawn today and your tests are completely normal, you will receive your results only by: Marland Kitchen MyChart Message (if you have MyChart) OR . A paper copy in the mail If you have any lab test that is abnormal or we need to change your treatment, we will call you to review the results.  Testing/Procedures: Your physician has requested that you have a lexiscan myoview. For further information please visit [email or web address]. Please follow instruction sheet, as given.   Follow-Up: At [facility], you and your health needs are our priority.  As part of our continuing mission to provide you with exceptional heart care, we have created designated Provider Care Teams.  These Care Teams include your primary Cardiologist (physician) and Advanced Practice Providers (APPs -  Physician Assistants and Nurse Practitioners) who  all work together to provide you with the care you need, when you need it. You will need a follow up appointment in 6-9 months.  Please call our office 2 months in advance to schedule this appointment.  You may see Sinclair Grooms, MD or one of the following Advanced Practice Providers on your designated Care Team:   Truitt Merle, NP Cecilie Kicks, NP . Kathyrn Drown, NP  Any Other Special Instructions Will Be Listed Below (If Applicable).       Signed, Sinclair Grooms, MD  09/23/2018 3:49 PM    [place]

## 2018-09-23 ENCOUNTER — Ambulatory Visit: Payer: Medicare Other | Admitting: Interventional Cardiology

## 2018-09-23 ENCOUNTER — Encounter: Payer: Self-pay | Admitting: *Deleted

## 2018-09-23 ENCOUNTER — Encounter: Payer: Self-pay | Admitting: Interventional Cardiology

## 2018-09-23 VITALS — BP 108/72 | HR 69 | Ht 62.75 in | Wt 164.0 lb

## 2018-09-23 DIAGNOSIS — D681 Hereditary factor XI deficiency: Secondary | ICD-10-CM | POA: Diagnosis not present

## 2018-09-23 DIAGNOSIS — R0600 Dyspnea, unspecified: Secondary | ICD-10-CM

## 2018-09-23 DIAGNOSIS — I10 Essential (primary) hypertension: Secondary | ICD-10-CM

## 2018-09-23 DIAGNOSIS — I4821 Permanent atrial fibrillation: Secondary | ICD-10-CM

## 2018-09-23 DIAGNOSIS — N182 Chronic kidney disease, stage 2 (mild): Secondary | ICD-10-CM

## 2018-09-23 DIAGNOSIS — R0609 Other forms of dyspnea: Secondary | ICD-10-CM | POA: Diagnosis not present

## 2018-09-23 DIAGNOSIS — I25119 Atherosclerotic heart disease of native coronary artery with unspecified angina pectoris: Secondary | ICD-10-CM

## 2018-09-23 DIAGNOSIS — Z7901 Long term (current) use of anticoagulants: Secondary | ICD-10-CM

## 2018-09-23 NOTE — Patient Instructions (Signed)
Medication Instructions:  Your physician recommends that you continue on your current medications as directed. Please refer to the Current Medication list given to you today.  If you need a refill on your cardiac medications before your next appointment, please call your pharmacy.   Lab work: None If you have labs (blood work) drawn today and your tests are completely normal, you will receive your results only by: Marland Kitchen MyChart Message (if you have MyChart) OR . A paper copy in the mail If you have any lab test that is abnormal or we need to change your treatment, we will call you to review the results.  Testing/Procedures: Your physician has requested that you have a lexiscan myoview. For further information please visit [email or web address]. Please follow instruction sheet, as given.   Follow-Up: At [facility], you and your health needs are our priority.  As part of our continuing mission to provide you with exceptional heart care, we have created designated Provider Care Teams.  These Care Teams include your primary Cardiologist (physician) and Advanced Practice Providers (APPs -  Physician Assistants and Nurse Practitioners) who all work together to provide you with the care you need, when you need it. You will need a follow up appointment in 6-9 months.  Please call our office 2 months in advance to schedule this appointment.  You may see Sinclair Grooms, MD or one of the following Advanced Practice Providers on your designated Care Team:   Truitt Merle, NP Cecilie Kicks, NP . Kathyrn Drown, NP  Any Other Special Instructions Will Be Listed Below (If Applicable).

## 2018-09-24 ENCOUNTER — Telehealth (HOSPITAL_COMMUNITY): Payer: Self-pay | Admitting: *Deleted

## 2018-09-24 NOTE — Telephone Encounter (Signed)
Patient given detailed instructions per Myocardial Perfusion Study Information Sheet for the test on 09/26/18 at 7:45. Patient notified to arrive 15 minutes early and that it is imperative to arrive on time for appointment to keep from having the test rescheduled.  If you need to cancel or reschedule your appointment, please call the office within 24 hours of your appointment. . Patient verbalized understanding.Hailey [name]

## 2018-09-25 ENCOUNTER — Other Ambulatory Visit: Payer: Self-pay | Admitting: Gynecology

## 2018-09-25 DIAGNOSIS — Z1231 Encounter for screening mammogram for malignant neoplasm of breast: Secondary | ICD-10-CM

## 2018-09-26 ENCOUNTER — Ambulatory Visit (HOSPITAL_COMMUNITY): Payer: Medicare Other | Attending: Cardiovascular Disease

## 2018-09-26 DIAGNOSIS — I25119 Atherosclerotic heart disease of native coronary artery with unspecified angina pectoris: Secondary | ICD-10-CM | POA: Diagnosis not present

## 2018-09-26 LAB — MYOCARDIAL PERFUSION IMAGING
LV dias vol: 70 mL (ref 46–106)
LV sys vol: 20 mL
Peak HR: 63 {beats}/min
Rest HR: 54 {beats}/min
SDS: 1
SRS: 0
SSS: 1
TID: 1.02

## 2018-09-26 MED ORDER — TECHNETIUM TC 99M TETROFOSMIN IV KIT
32.5000 | PACK | Freq: Once | INTRAVENOUS | Status: AC | PRN
  Administered 2018-09-26: 32.5 via INTRAVENOUS
  Filled 2018-09-26: qty 33

## 2018-09-26 MED ORDER — TECHNETIUM TC 99M TETROFOSMIN IV KIT
10.8000 | PACK | Freq: Once | INTRAVENOUS | Status: AC | PRN
  Administered 2018-09-26: 10.8 via INTRAVENOUS
  Filled 2018-09-26: qty 11

## 2018-09-26 MED ORDER — REGADENOSON 0.4 MG/5ML IV SOLN
0.4000 mg | Freq: Once | INTRAVENOUS | Status: AC
  Administered 2018-09-26: 0.4 mg via INTRAVENOUS

## 2018-09-29 ENCOUNTER — Encounter: Payer: Self-pay | Admitting: Family

## 2018-09-29 ENCOUNTER — Ambulatory Visit (INDEPENDENT_AMBULATORY_CARE_PROVIDER_SITE_OTHER): Payer: Medicare Other | Admitting: Family

## 2018-09-29 VITALS — BP 114/78 | HR 55 | Temp 97.8°F | Ht 62.0 in | Wt 164.0 lb

## 2018-09-29 DIAGNOSIS — B9789 Other viral agents as the cause of diseases classified elsewhere: Secondary | ICD-10-CM

## 2018-09-29 DIAGNOSIS — J069 Acute upper respiratory infection, unspecified: Secondary | ICD-10-CM

## 2018-09-29 MED ORDER — FLUTICASONE PROPIONATE 50 MCG/ACT NA SUSP: 2.0000 | Freq: Every day | NASAL | 6 refills | Status: DC

## 2018-09-29 MED ORDER — MAGIC MOUTHWASH: 5.0000 mL | Freq: Three times a day (TID) | ORAL | 0 refills | Status: DC | PRN

## 2018-09-29 NOTE — Progress Notes (Signed)
Hailey [name] is a 84 y.o. female with the following history as recorded in EpicCare:  Patient Active Problem List   Diagnosis Date Noted  . Viral URI with cough 08/15/2018  . Abdominal pain 05/22/2018  . Traumatic hematoma of right knee 01/10/2018  . Ulcer of right knee ([place])   . Bradycardia with 41-50 beats per minute 12/27/2017  . Right bundle branch block (RBBB) on electrocardiogram (ECG) 12/27/2017  . Quadriceps tendon rupture 12/24/2017  . CKD (chronic kidney disease), stage II 12/24/2017  . Cellulitis of leg, right with large prepatella hematoma and open wounds 12/24/2017  . Iliotibial band syndrome of right side 12/16/2017  . Depression 07/11/2017  . Intervertebral lumbar disc disorder with myelopathy, lumbar region 04/25/2017  . Chronic anticoagulation 05/24/2015  . Coronary artery disease involving native coronary artery of native heart with angina pectoris ([place]) 12/20/2014  . Iliotibial band syndrome of left side 11/16/2014  . Diverticulitis of colon 08/30/2014  . PAF (paroxysmal atrial fibrillation) ([place]) 11/21/2012  . Greater trochanteric bursitis of both hips 05/21/2012  . 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 Osteoarthritis of left hip 07/03/2010  . [place] DISEASE, LUMBOSACRAL SPINE 05/18/2010  . SYNCOPE 10/27/2008  . Essential hypertension 06/16/2007  . GERD 06/16/2007  . COLONIC POLYPS, HX OF 06/16/2007    Current Outpatient Medications  Medication Sig Dispense Refill  . acetaminophen (TYLENOL) 500 MG tablet Take 1,000 mg by mouth every 6 (six) hours as needed (Coated or capsules).    Marland Kitchen atorvastatin (LIPITOR) 80 MG tablet Take 1 tablet (80 mg total) by mouth daily at 6 PM. 90 tablet 3  . cetirizine (ZYRTEC) 10 MG tablet Take 10 mg by mouth daily.    . clindamycin (CLEOCIN) 2 % vaginal cream Place 1 Applicatorful vaginally at bedtime. For 7 nights 40 g 0  . Clobetasol Prop Emollient Base 0.05 % emollient cream Apply nightly as directed. 30 g 2  .  DULoxetine (CYMBALTA) 60 MG capsule Take 60 mg by mouth daily.    Marland Kitchen ELIQUIS 5 MG TABS tablet TAKE 1 TABLET BY MOUTH TWICE DAILY. 180 tablet 3  . LORazepam (ATIVAN) 1 MG tablet Take 1 tablet (1 mg total) by mouth at bedtime. 180 tablet 1  . metoprolol succinate (TOPROL-XL) 25 MG 24 hr tablet Take 3 tablets (75 mg total) by mouth daily. Take with or immediately following a meal. 270 tablet 3  . nitroGLYCERIN (NITROSTAT) 0.4 MG SL tablet Place 0.4 mg under the tongue every 5 (five) minutes as needed for chest pain (Call 911 at 3rd dose within 15 minutes.).    Marland Kitchen pantoprazole (PROTONIX) 40 MG tablet Take 1 tablet (40 mg total) by mouth daily. 90 tablet 3  . Probiotic Product (ALIGN PO) Take 1 capsule by mouth daily.    . sodium chloride (OCEAN) 0.65 % SOLN nasal spray Place 1 spray into both nostrils as needed for congestion.    Marland Kitchen terconazole (TERAZOL 7) 0.4 % vaginal cream Place 1 applicator vaginally at bedtime. 45 g 0  . fluticasone (FLONASE) 50 MCG/ACT nasal spray Place 2 sprays into both nostrils daily. 16 g 6  . magic mouthwash SOLN Take 5 mLs by mouth 3 (three) times daily as needed for mouth pain. 120 mL 0   No current facility-administered medications for this visit.     Allergies: Macrobid [nitrofurantoin monohyd macro]; Meloxicam; Digoxin and related; Mobic [meloxicam]; and Sulfamethoxazole-trimethoprim  Past Medical History:  Diagnosis Date  . Acute blood loss anemia 12/25/2017  .  Allergic rhinitis   . Anxiety   . Arthritis    "my whole spine" (07/01/2017)  . Atrial fibrillation ([place])   . Bowel obstruction ([place])    in [place]  . Bradycardia with 41-50 beats per minute 12/27/2017  . Cellulitis of leg, right with large prepatella hematoma and open wounds 12/26/2017  . CKD (chronic kidney disease) stage 2, GFR 60-89 ml/min   . Colon polyps   . Coronary artery disease    10/18 PCI/DES to p/m LCx with cutting balloon to mLcx  . Diverticulosis of colon   . GERD (gastroesophageal reflux  disease)   . Hip bursitis 2010   Dr Para March, Post op seroma  . History of colon polyps   . HTN (hypertension)   . IBS (irritable bowel syndrome)    constipation predominant - Dr Earlean Shawl  . Lichen sclerosus   . Osteopenia 11/2016   T score -2.0 FRAX 15%/4.3%  . PAC (premature atrial contraction)    Symptomatiic  . Right bundle branch block (RBBB) on electrocardiogram (ECG) 12/27/2017  . Scoliosis   . SVT (supraventricular tachycardia) ([place])    brief history    Past Surgical History:  Procedure Laterality Date  . ANTERIOR AND POSTERIOR VAGINAL REPAIR  01/2002   Archie Endo 01/23/2011  . APPENDECTOMY  1948  . CARDIAC CATHETERIZATION  06/26/2017  . CORONARY ANGIOPLASTY WITH STENT PLACEMENT  07/01/2017  . CORONARY ATHERECTOMY N/A 07/01/2017   Procedure: CORONARY ATHERECTOMY;  Surgeon: Belva Crome, MD;  Location: [facility];  Service: Cardiovascular;  Laterality: N/A;  . CORONARY STENT INTERVENTION N/A 07/01/2017   Procedure: CORONARY STENT INTERVENTION;  Surgeon: Belva Crome, MD;  Location: [facility];  Service: Cardiovascular;  Laterality: N/A;  . HAMMER TOE SURGERY    . HEMORRHOID BANDING    . HIP SURGERY Left 04/2009   hip examination under anesthesia followed by greater trochanteric bursectomy; iliotibial band tenotomy/notes 01/20/2011  . I&D EXTREMITY Right 01/10/2018   Procedure: IRRIGATION AND DEBRIDEMENT RIGHT KNEE, APPLY WOUND VAC;  Surgeon: Newt Minion, MD;  Location: [facility];  Service: Orthopedics;  Laterality: Right;  . KNEE BURSECTOMY Right 04/2009   Archie Endo 01/09/2011  . LEFT HEART CATH AND CORONARY ANGIOGRAPHY N/A 06/26/2017   Procedure: LEFT HEART CATH AND CORONARY ANGIOGRAPHY;  Surgeon: Belva Crome, MD;  Location: [facility];  Service: Cardiovascular;  Laterality: N/A;  . PUBOVAGINAL SLING  01/2002   Archie Endo 01/23/2011  . REDUCTION MAMMAPLASTY    . TEMPORARY PACEMAKER N/A 07/01/2017   Procedure: TEMPORARY PACEMAKER;  Surgeon: Belva Crome, MD;   Location: [facility];  Service: Cardiovascular;  Laterality: N/A;  . VAGINAL HYSTERECTOMY  01/2002   Vaginal hysterectomy, bilateral salpingo-oophorectomy/notes 01/23/2011    Family History  Problem Relation Age of Onset  . Colon cancer Mother   . Diabetes Father   . Prostate cancer Father   . Prostate cancer Brother   . Pancreatic cancer Brother   . Stomach cancer Son   . Heart attack Neg Hx   . Stroke Neg Hx     Social History   Tobacco Use  . Smoking status: Former Smoker    Packs/day: 0.25    Years: 28.00    Pack years: 7.00    Types: Cigarettes    Last attempt to quit: 1981    Years since quitting: 39.0  . Smokeless tobacco: Never Used  Substance Use Topics  . Alcohol use: Yes    Alcohol/week:  21.0 standard drinks    Types: 7 Standard drinks or equivalent, 14 Shots of liquor per week    Comment: 07/01/2017 "couple shots/day"    Subjective:  Patient notes that she woke up with sore throat, congestion this morning; no chest pain, no shortness of breath. Scheduled to leave for a trip in 10 days- going to [place]; has not taken any medication; no fever;    Objective:  Vitals:   09/29/18 1347  BP: 114/78  Pulse: (!) 55  Temp: 97.8 F (36.6 C)  TempSrc: Oral  SpO2: 93%  Weight: 164 lb 0.6 oz (74.4 kg)  Height: 5\' 2"  (1.575 m)    General: Well developed, well nourished, in no acute distress  Skin : Warm and dry.  Head: Normocephalic and atraumatic  Eyes: Sclera and conjunctiva clear; pupils round and reactive to light; extraocular movements intact  Ears: External normal; canals clear; tympanic membranes normal  Oropharynx: Pink, supple. No suspicious lesions  Neck: Supple without thyromegaly, adenopathy  Lungs: Respirations unlabored; clear to auscultation bilaterally without wheeze, rales, rhonchi  CVS exam: normal rate and regular rhythm.  Abdomen: Soft; nontender; nondistended; normoactive bowel sounds; no masses or hepatosplenomegaly  Musculoskeletal:  No deformities; no active joint inflammation  Extremities: No edema, cyanosis, clubbing  Vessels: Symmetric bilaterally  Neurologic: Alert and oriented; speech intact; face symmetrical; moves all extremities well; CNII-XII intact without focal deficit   Assessment:  1. Viral URI with cough     Plan:  Reassurance; explained to patient that it is not appropriate to start an antibiotic simply because she feels bad and is scheduled to travel; symptoms have not even been present x 24 hours; symptomatic treatment discussed/ increase fluids, rest; continue her Zyrtec, add Flonase; Rx also given for Magic Mouthwash for sore throat- use as directed.   No follow-ups on file.  No orders of the defined types were placed in this encounter.   Requested Prescriptions   Signed Prescriptions Disp Refills  . fluticasone (FLONASE) 50 MCG/ACT nasal spray 16 g 6    Sig: Place 2 sprays into both nostrils daily.  . magic mouthwash SOLN 120 mL 0    Sig: Take 5 mLs by mouth 3 (three) times daily as needed for mouth pain.

## 2018-10-01 ENCOUNTER — Telehealth: Payer: Self-pay

## 2018-10-01 ENCOUNTER — Ambulatory Visit: Payer: Medicare Other | Admitting: Gynecology

## 2018-10-01 MED ORDER — CEFDINIR 300 MG PO CAPS: 300.0000 mg | ORAL_CAPSULE | Freq: Two times a day (BID) | ORAL | 0 refills | Status: DC

## 2018-10-01 NOTE — Telephone Encounter (Signed)
Copied from [place] [id number]. Topic: General - Other >> Oct 01, 2018  1:38 PM Hailey [name] wrote:  Pt was in on Monday and now she has developed a cough a lot of mucus and is asking if she need to come back in or if something else can be called in    [place]

## 2018-10-01 NOTE — Addendum Note (Signed)
Addended by: Sherlene Shams on: 10/01/2018 03:02 PM   Modules accepted: Orders

## 2018-10-01 NOTE — Telephone Encounter (Signed)
Spoke with patient and info given 

## 2018-10-01 NOTE — Telephone Encounter (Signed)
Sending in 10 days of [place];

## 2018-10-03 ENCOUNTER — Telehealth: Payer: Self-pay

## 2018-10-03 NOTE — Telephone Encounter (Signed)
Copied from [place]. Topic: General - Other >> Oct 03, 2018 10:40 AM Windy Kalata wrote: Reason for CRM: When coughing she seems to be rattling in her chest, she had an appt on Monday. She said her chest was clear, please advise  Best call back is [phone] >> Oct 03, 2018 11:58 AM Para Skeans A wrote: Had an appointment with Jodi Mourning.

## 2018-10-03 NOTE — Telephone Encounter (Signed)
Patient's PCP spoke to her over [facility] message already this morning regarding her concerns.

## 2018-10-06 ENCOUNTER — Ambulatory Visit: Payer: Self-pay

## 2018-10-06 ENCOUNTER — Other Ambulatory Visit: Payer: Self-pay | Admitting: Internal Medicine

## 2018-10-06 NOTE — Telephone Encounter (Signed)
Pt was given Cefdinir on 10/01/17 for 10 days and has not finished, please advise

## 2018-10-06 NOTE — Telephone Encounter (Signed)
Pt. Reports she was seen 09/29/18 and started on antibiotic. Still having runny nose and productive cough with yellow mucus. Does "feel a little better." States she is concerned because she is flying Thursday, going on a trip. Feels like she needs more antibiotic.Using Mucinex and nasal spray. Please advise pt.  Answer Assessment - Initial Assessment Questions 1. ONSET: "When did the cough begin?"      Started 1 week ago 2. SEVERITY: "How bad is the cough today?"      Better than it was 3. RESPIRATORY DISTRESS: "Describe your breathing."      No distress  4. FEVER: "Do you have a fever?" If so, ask: "What is your temperature, how was it measured, and when did it start?"     No 5. SPUTUM: "Describe the color of your sputum" (clear, white, yellow, green)     Yellow 6. HEMOPTYSIS: "Are you coughing up any blood?" If so ask: "How much?" (flecks, streaks, tablespoons, etc.)     No 7. CARDIAC HISTORY: "Do you have any history of heart disease?" (e.g., heart attack, congestive heart failure)      HTN, a-FIB 8. LUNG HISTORY: "Do you have any history of lung disease?"  (e.g., pulmonary embolus, asthma, emphysema)     No 9. PE RISK FACTORS: "Do you have a history of blood clots?" (or: recent major surgery, recent prolonged travel, bedridden)     No 10. OTHER SYMPTOMS: "Do you have any other symptoms?" (e.g., runny nose, wheezing, chest pain)       Wheezing at night, runny nose - clear mucus 11. PREGNANCY: "Is there any chance you are pregnant?" "When was your last menstrual period?"       No 12. TRAVEL: "Have you traveled out of the country in the last month?" (e.g., travel history, exposures)       No  Protocols used: [place]

## 2018-10-07 NOTE — Telephone Encounter (Signed)
Finish abx Rest OV if problems Thx

## 2018-10-07 NOTE — Telephone Encounter (Signed)
Pt.notified

## 2018-11-03 ENCOUNTER — Ambulatory Visit
Admission: RE | Admit: 2018-11-03 | Discharge: 2018-11-03 | Disposition: A | Discharge status: Home / Self Care | Payer: Medicare Other | Source: Ambulatory Visit | Attending: Gynecology | Admitting: Gynecology

## 2018-11-03 DIAGNOSIS — Z1231 Encounter for screening mammogram for malignant neoplasm of breast: Secondary | ICD-10-CM | POA: Diagnosis not present

## 2018-11-07 ENCOUNTER — Ambulatory Visit (INDEPENDENT_AMBULATORY_CARE_PROVIDER_SITE_OTHER): Payer: Medicare Other | Admitting: *Deleted

## 2018-11-07 ENCOUNTER — Telehealth: Payer: Self-pay

## 2018-11-07 DIAGNOSIS — Z23 Encounter for immunization: Secondary | ICD-10-CM

## 2018-11-07 NOTE — Progress Notes (Deleted)
Per office policy need to send to another MD to sign since PCP is out of the office

## 2018-11-07 NOTE — Telephone Encounter (Signed)
Yes, due pneumonia 23

## 2018-11-07 NOTE — Telephone Encounter (Signed)
Routing to dr crawford in the absence of dr plotnikov---patient is scheduled for pneumonia vaccine today on nurse schedule---she recd prevnar 13 in 2015, do you recommend ok to give pneumo 23 today?  Or please advise if you have other recommendations, thanks

## 2018-11-11 ENCOUNTER — Ambulatory Visit (INDEPENDENT_AMBULATORY_CARE_PROVIDER_SITE_OTHER): Payer: Medicare Other | Admitting: Internal Medicine

## 2018-11-11 ENCOUNTER — Encounter: Payer: Self-pay | Admitting: Internal Medicine

## 2018-11-11 DIAGNOSIS — I10 Essential (primary) hypertension: Secondary | ICD-10-CM | POA: Diagnosis not present

## 2018-11-11 DIAGNOSIS — Z7901 Long term (current) use of anticoagulants: Secondary | ICD-10-CM

## 2018-11-11 DIAGNOSIS — I480 Paroxysmal atrial fibrillation: Secondary | ICD-10-CM

## 2018-11-11 DIAGNOSIS — I25119 Atherosclerotic heart disease of native coronary artery with unspecified angina pectoris: Secondary | ICD-10-CM | POA: Diagnosis not present

## 2018-11-11 NOTE — Assessment & Plan Note (Signed)
Will ask Dr Tamala Julian if we can reduce Hailey [name] due to bruising

## 2018-11-11 NOTE — Assessment & Plan Note (Signed)
On Pravastatin, Eliquis, Metoprolol

## 2018-11-11 NOTE — Progress Notes (Signed)
Subjective:  Patient ID: Hailey [name], female    DOB: May 06, 1935  Age: 84 y.o. MRN: [id number]  CC: No chief complaint on file.   HPI MERRICK FEUTZ presents for extensive bruising on the lower legs.  Corky is on Eliquis 5 mg twice daily for PAF.  Complains of occasional nosebleeds.  She is worried about her previous traumatic hematoma of the right knee accompanied by a nonhealing wound that eventually healed. Follow-up hypertension.  Outpatient Medications Prior to Visit  Medication Sig Dispense Refill  . acetaminophen (TYLENOL) 500 MG tablet Take 1,000 mg by mouth every 6 (six) hours as needed (Coated or capsules).    Marland Kitchen atorvastatin (LIPITOR) 80 MG tablet Take 1 tablet (80 mg total) by mouth daily at 6 PM. 90 tablet 3  . cefdinir (OMNICEF) 300 MG capsule Take 1 capsule (300 mg total) by mouth 2 (two) times daily. 20 capsule 0  . cetirizine (ZYRTEC) 10 MG tablet Take 10 mg by mouth daily.    . clindamycin (CLEOCIN) 2 % vaginal cream Place 1 Applicatorful vaginally at bedtime. For 7 nights 40 g 0  . Clobetasol Prop Emollient Base 0.05 % emollient cream Apply nightly as directed. 30 g 2  . diclofenac sodium (VOLTAREN) 1 % GEL APPLY 2 GRAMS TOPICALLY 4 TIMES DAILY. 100 g 0  . DULoxetine (CYMBALTA) 60 MG capsule Take 60 mg by mouth daily.    Marland Kitchen ELIQUIS 5 MG TABS tablet TAKE 1 TABLET BY MOUTH TWICE DAILY. 180 tablet 3  . fluticasone (FLONASE) 50 MCG/ACT nasal spray Place 2 sprays into both nostrils daily. 16 g 6  . LORazepam (ATIVAN) 1 MG tablet Take 1 tablet (1 mg total) by mouth at bedtime. 180 tablet 1  . magic mouthwash SOLN Take 5 mLs by mouth 3 (three) times daily as needed for mouth pain. 120 mL 0  . metoprolol succinate (TOPROL-XL) 25 MG 24 hr tablet Take 3 tablets (75 mg total) by mouth daily. Take with or immediately following a meal. 270 tablet 3  . nitroGLYCERIN (NITROSTAT) 0.4 MG SL tablet Place 0.4 mg under the tongue every 5 (five) minutes as needed for chest pain (Call 911  at 3rd dose within 15 minutes.).    Marland Kitchen pantoprazole (PROTONIX) 40 MG tablet Take 1 tablet (40 mg total) by mouth daily. 90 tablet 3  . Probiotic Product (ALIGN PO) Take 1 capsule by mouth daily.    . sodium chloride (OCEAN) 0.65 % SOLN nasal spray Place 1 spray into both nostrils as needed for congestion.    Marland Kitchen terconazole (TERAZOL 7) 0.4 % vaginal cream Place 1 applicator vaginally at bedtime. 45 g 0   No facility-administered medications prior to visit.     ROS: Review of Systems  Constitutional: Negative for activity change, appetite change, chills, fatigue and unexpected weight change.  HENT: Positive for nosebleeds. Negative for congestion, mouth sores and sinus pressure.   Eyes: Negative for visual disturbance.  Respiratory: Negative for cough and chest tightness.   Gastrointestinal: Negative for abdominal pain and nausea.  Genitourinary: Negative for difficulty urinating, frequency and vaginal pain.  Musculoskeletal: Positive for arthralgias. Negative for back pain and gait problem.  Skin: Negative for pallor and rash.  Neurological: Negative for dizziness, tremors, weakness, numbness and headaches.  Hematological: Bruises/bleeds easily.  Psychiatric/Behavioral: Negative for confusion and sleep disturbance.    Objective:  BP 126/82 (BP Location: Left Arm, Patient Position: Sitting, Cuff Size: Normal)   Pulse (!) 51   Temp 97.8  F (36.6 C) (Oral)   Ht 5\' 2"  (1.575 m)   Wt 161 lb (73 kg)   SpO2 94%   BMI 29.45 kg/m   BP Readings from Last 3 Encounters:  11/11/18 126/82  09/29/18 114/78  09/23/18 108/72    Wt Readings from Last 3 Encounters:  11/11/18 161 lb (73 kg)  09/29/18 164 lb 0.6 oz (74.4 kg)  09/26/18 164 lb (74.4 kg)    Physical Exam Constitutional:      General: She is not in acute distress.    Appearance: She is well-developed.  HENT:     Head: Normocephalic.     Right Ear: External ear normal.     Left Ear: External ear normal.     Nose: Nose  normal.  Eyes:     General:        Right eye: No discharge.        Left eye: No discharge.     Conjunctiva/sclera: Conjunctivae normal.     Pupils: Pupils are equal, round, and reactive to light.  Neck:     Musculoskeletal: Normal range of motion and neck supple.     Thyroid: No thyromegaly.     Vascular: No JVD.     Trachea: No tracheal deviation.  Cardiovascular:     Rate and Rhythm: Normal rate and regular rhythm.     Heart sounds: Normal heart sounds.  Pulmonary:     Effort: No respiratory distress.     Breath sounds: No stridor. No wheezing.  Abdominal:     General: Bowel sounds are normal. There is no distension.     Palpations: Abdomen is soft. There is no mass.     Tenderness: There is no abdominal tenderness. There is no guarding or rebound.  Musculoskeletal:        General: No tenderness.  Lymphadenopathy:     Cervical: No cervical adenopathy.  Skin:    Findings: No erythema or rash.  Neurological:     Cranial Nerves: No cranial nerve deficit.     Motor: No abnormal muscle tone.     Coordination: Coordination normal.     Deep Tendon Reflexes: Reflexes normal.  Psychiatric:        Behavior: Behavior normal.        Thought Content: Thought content normal.        Judgment: Judgment normal.   Multiple bruises and discoloration of the lower extremities and to a lesser extent upper extremities  Lab Results  Component Value Date   WBC 6.5 05/22/2018   HGB 11.5 (L) 05/22/2018   HCT 34.8 (L) 05/22/2018   PLT 239.0 05/22/2018   GLUCOSE 82 05/22/2018   CHOL 171 11/26/2016   TRIG 99 11/26/2016   HDL 90 11/26/2016   LDLDIRECT 89.1 06/29/2013   LDLCALC 61 11/26/2016   ALT 13 05/22/2018   AST 23 05/22/2018   NA 139 05/22/2018   K 4.4 05/22/2018   CL 105 05/22/2018   CREATININE 1.35 (H) 05/22/2018   BUN 27 (H) 05/22/2018   CO2 26 05/22/2018   TSH 2.11 04/29/2015   INR 1.22 01/10/2018   HGBA1C 5.6 07/04/2014    Mm 3d Screen Breast Bilateral  Result Date:  11/03/2018 CLINICAL DATA:  Screening. EXAM: DIGITAL SCREENING BILATERAL MAMMOGRAM WITH TOMO AND CAD COMPARISON:  Previous exam(s). ACR Breast Density Category b: There are scattered areas of fibroglandular density. FINDINGS: There are no findings suspicious for malignancy. Images were processed with CAD. IMPRESSION: No mammographic evidence of malignancy.  A result letter of this screening mammogram will be mailed directly to the patient. RECOMMENDATION: Screening mammogram in one year. (Code:SM-B-01Y) BI-RADS CATEGORY  1: Negative. Electronically Signed   By: Margarette Canada M.D.   On: 11/03/2018 16:54    Assessment & Plan:   There are no diagnoses linked to this encounter.   No orders of the defined types were placed in this encounter.    Follow-up: No follow-ups on file.  Walker Kehr, MD

## 2018-11-13 DIAGNOSIS — R21 Rash and other nonspecific skin eruption: Secondary | ICD-10-CM | POA: Diagnosis not present

## 2018-11-13 DIAGNOSIS — D225 Melanocytic nevi of trunk: Secondary | ICD-10-CM | POA: Diagnosis not present

## 2018-11-13 DIAGNOSIS — D1801 Hemangioma of skin and subcutaneous tissue: Secondary | ICD-10-CM | POA: Diagnosis not present

## 2018-11-13 DIAGNOSIS — D692 Other nonthrombocytopenic purpura: Secondary | ICD-10-CM | POA: Diagnosis not present

## 2018-11-13 DIAGNOSIS — L570 Actinic keratosis: Secondary | ICD-10-CM | POA: Diagnosis not present

## 2018-11-13 NOTE — Assessment & Plan Note (Signed)
Multiple extremity bruising and occasional nosebleeds.  Will check with Dr. Tamala Julian whether we could reduce her Eliquis to 2.5 mg twice daily.

## 2018-11-13 NOTE — Assessment & Plan Note (Signed)
BP Readings from Last 3 Encounters:  11/11/18 126/82  09/29/18 114/78  09/23/18 108/72

## 2018-11-14 ENCOUNTER — Telehealth: Payer: Self-pay | Admitting: Interventional Cardiology

## 2018-11-14 NOTE — Telephone Encounter (Signed)
Spoke with pt and she states that her lower right leg is terribly bruised, almost the whole thing.  Small bruised area on left leg.  Denies bumping into anything to cause this much bruising.  Pt seen PCP earlier this week and he recommended reaching out to Cardiology to see if we may want to lower her Eliquis to 2.5mg  BID?  I do see mention of this in Dr. Judeen Hammans note.  Will route to Dr. Tamala Julian for review.

## 2018-11-14 NOTE — Telephone Encounter (Signed)
° ° °  Patient calling to request dose of Eliquis be changed, bruising on legs

## 2018-11-15 ENCOUNTER — Other Ambulatory Visit: Payer: Self-pay | Admitting: Internal Medicine

## 2018-11-15 NOTE — Telephone Encounter (Signed)
Okay to decrease the Eliquis to 2.5 mg BID but is suboptimal dose for her age/weight/kidney status and therefore stroke risk will be slightly higher.

## 2018-11-17 MED ORDER — APIXABAN 2.5 MG PO TABS: 2.5000 mg | ORAL_TABLET | Freq: Two times a day (BID) | ORAL | 3 refills | Status: DC

## 2018-11-17 NOTE — Telephone Encounter (Signed)
I spoke with the patient and informed her of Dr Thompson Caul recommendation on the Eliquis, reluctantly reducing to 2.5 mg bid.  She will call us with an update later in the week about the bruising.  She was thankful for the call.

## 2018-11-17 NOTE — Telephone Encounter (Signed)
Appears that 30 is a 30 day supply. Will fill for 1 month only

## 2018-12-24 ENCOUNTER — Encounter: Payer: Medicare Other | Admitting: Gynecology

## 2018-12-26 ENCOUNTER — Other Ambulatory Visit: Payer: Self-pay | Admitting: Internal Medicine

## 2018-12-26 NOTE — Telephone Encounter (Signed)
Control database checked last refill: 11/17/2018 LOV: 11/11/2018 NOV: none

## 2018-12-29 ENCOUNTER — Other Ambulatory Visit: Payer: Self-pay | Admitting: Family

## 2019-02-03 ENCOUNTER — Other Ambulatory Visit: Payer: Self-pay

## 2019-02-04 ENCOUNTER — Encounter: Payer: Medicare Other | Admitting: Gynecology

## 2019-02-05 ENCOUNTER — Encounter: Payer: Self-pay | Admitting: Gynecology

## 2019-02-05 ENCOUNTER — Other Ambulatory Visit: Payer: Self-pay

## 2019-02-05 ENCOUNTER — Ambulatory Visit: Payer: Medicare Other | Admitting: Gynecology

## 2019-02-05 VITALS — BP 124/80 | Ht 63.0 in | Wt 161.0 lb

## 2019-02-05 DIAGNOSIS — M8588 Other specified disorders of bone density and structure, other site: Secondary | ICD-10-CM

## 2019-02-05 DIAGNOSIS — N952 Postmenopausal atrophic vaginitis: Secondary | ICD-10-CM | POA: Diagnosis not present

## 2019-02-05 DIAGNOSIS — N3000 Acute cystitis without hematuria: Secondary | ICD-10-CM

## 2019-02-05 DIAGNOSIS — Z01419 Encounter for gynecological examination (general) (routine) without abnormal findings: Secondary | ICD-10-CM | POA: Diagnosis not present

## 2019-02-05 DIAGNOSIS — L900 Lichen sclerosus et atrophicus: Secondary | ICD-10-CM

## 2019-02-05 MED ORDER — CIPROFLOXACIN HCL 250 MG PO TABS: 250.0000 mg | ORAL_TABLET | Freq: Two times a day (BID) | ORAL | 0 refills | Status: DC

## 2019-02-05 NOTE — Addendum Note (Signed)
Addended by: Nelva Nay on: 02/05/2019 12:11 PM   Modules accepted: Orders

## 2019-02-05 NOTE — Progress Notes (Signed)
Hailey [name] 15-Aug-1935 [id number]        84 y.o.  G2P2001 for annual gynecologic exam.  Also complaining of vaginal irritation externally.  She has a history of lichen sclerosis and is using clobetasol intermittently which relieves her discomfort but then it returns.  She also notes over the last several weeks some lower suprapubic discomfort.  No urgency frequency dysuria low back pain fever or chills.  Lastly she is has noticed some bumps around her anus that she also wants me to take a look at.  They have been present for a long time and are not bothersome to her other than she just felt them on self exam.  Past medical history,surgical history, problem list, medications, allergies, family history and social history were all reviewed and documented as reviewed in the EPIC chart.  ROS:  Performed with pertinent positives and negatives included in the history, assessment and plan.   Additional significant findings : None   Exam: Hailey [name] assistant Vitals:   02/05/19 1004  BP: 124/80  Weight: 161 lb (73 kg)  Height: 5\' 3"  (1.6 m)   Body mass index is 28.52 kg/m.  General appearance:  Normal affect, orientation and appearance. Skin: Grossly normal HEENT: Without gross lesions.  No cervical or supraclavicular adenopathy. Thyroid normal.  Lungs:  Clear without wheezing, rales or rhonchi Cardiac: RR, without RMG Abdominal:  Soft, nontender, without masses, guarding, rebound, organomegaly or hernia Breasts:  Examined lying and sitting without masses, retractions, discharge or axillary adenopathy.  Bilateral reduction scars noted Pelvic:  Ext, BUS, Vagina: With atrophic changes.  Blanching of the perineal body and posterior fourchette consistent with her history of lichen sclerosis.  Adnexa: Without masses or tenderness    Anus and perineum: With multiple small classic appearing sebaceous cysts around the anal opening.  Rectovaginal: Normal sphincter tone without palpated masses  or tenderness.    Assessment/Plan:  84 y.o. [name] for annual gynecologic exam.   1. Suprapubic discomfort.  Exam is normal.  Urine analysis consistent with UTI.  Options for treatment reviewed.  She does have sensitivities to sulfa and Macrodantin.  Ultimately we decided on ciprofloxacin 250 mg twice daily x7 days.  I discussed the black box warning and she is comfortable with this.  We will follow-up if her symptoms continue. 2. Perianal small sebaceous cyst.  Classic in appearance.  Reassured patient.  She will follow and if the appear to be getting larger or symptomatic she will be present. 3. Postmenopausal.  Status post TVH BSO in the past.  Doing well without significant menopausal symptoms.  She had been on vaginal estrogen in the past and asked about possibly restarting that to help with her vaginal irritation symptoms.  I discussed the pros and cons of vaginal estrogen to include the risks of absorption with systemic effects such as thrombosis, stroke heart attack DVT in the breast cancer issue.  I discussed the benefits as possibly decreasing urinary tract infection risks and helping with her vaginal irritation symptoms.  Ultimately she wants to go ahead and restart this.  Will use prefilled syringes through [facility] twice weekly. 4. History of lichen sclerosis.  Exam consistent with this.  Does have intermittent irritation which she treats with clobetasol cream.  Does well with this when she uses it.  Has supply but will call when she needs more.  5. Osteopenia.  DEXA 2018 T score -2.0 FRAX 15% / 4.3%.  Had declined treatment in the  past for elevated hip FRAX.  Plan repeat DEXA now at 2-year interval and she will schedule in follow-up for this. 6. Mammography 10/2018.  Continue with annual mammography when due.  Breast exam normal today. 7. Colonoscopy 2013.  Repeat at their recommended interval. 8. Health maintenance.  No routine lab work done as patient does this  elsewhere.  For bone density as scheduled.  Follow-up 1 year for annual exam.  Additional time in excess of her breast and pelvic exam was spent in direct face to face counseling and coordination of care in regards to her UTI and vaginitis symptoms with prescriptions provided.   Hailey Auerbach MD, 10:26 AM 02/05/2019

## 2019-02-05 NOTE — Patient Instructions (Signed)
Follow-up for the bone density as scheduled.  Start on the vaginal estrogen cream twice weekly through [facility].  Call if you have any issues with this.  Take the ciprofloxacin antibiotic twice daily for the urinary tract infection for 1 week.  Follow-up if your symptoms persist.  Follow-up in 1 year for annual exam

## 2019-02-07 LAB — URINALYSIS, COMPLETE W/RFL CULTURE
Bilirubin Urine: NEGATIVE
Glucose, UA: NEGATIVE
Hgb urine dipstick: NEGATIVE
Ketones, ur: NEGATIVE
Nitrites, Initial: NEGATIVE
Protein, ur: NEGATIVE
RBC / HPF: NONE SEEN /HPF (ref 0–2)
Specific Gravity, Urine: 1.015 (ref 1.001–1.03)
pH: 5.5 (ref 5.0–8.0)

## 2019-02-07 LAB — URINE CULTURE
MICRO NUMBER:: 518565
SPECIMEN QUALITY:: ADEQUATE

## 2019-02-07 LAB — CULTURE INDICATED

## 2019-02-09 ENCOUNTER — Telehealth: Payer: Self-pay

## 2019-02-09 MED ORDER — NONFORMULARY OR COMPOUNDED ITEM: 3 refills | Status: DC

## 2019-02-09 NOTE — Telephone Encounter (Signed)
Patient called because she had visit with TF last week and was told Rx would be called in to [facility] but when she checked they did not have Rx.  Office note read " Ultimately she wants to go ahead and restart this.  Will use prefilled syringes through [facility] twice weekly."  I will go ahead and call the Estradiol Cream in for her. Patient advised.

## 2019-02-13 ENCOUNTER — Telehealth: Payer: Self-pay | Admitting: Interventional Cardiology

## 2019-02-13 NOTE — Telephone Encounter (Signed)
I spoke to the patient who would be reluctant to increase the Eliquis to 5 mg bid, because of the excessive bruising/bleeding which occurred in the past.  She still experiences some at the decreased 2.5 mg.  Please advise, thank you.

## 2019-02-13 NOTE — Telephone Encounter (Signed)
° ° °  Pt c/o medication issue:  1. Name of Medication: Estradiol  (vaginal application)  2. How are you currently taking this medication (dosage and times per day)? n/a  3. Are you having a reaction (difficulty breathing--STAT)? no  4. What is your medication issue? Patient states the instruction label gives warning about blood clots, patient has concerns about being on Eliquis and using the cream. Unsure if she should use. Please call

## 2019-02-13 NOTE — Telephone Encounter (Signed)
Stay on lower dose.

## 2019-02-13 NOTE — Telephone Encounter (Signed)
There is a black box warning on estrogen products due to increase risk of developing blood clots. Since pt takes Eliquis, this should help to prevent her from developing clots as she already takes this for afib. However, she is on a lower than recommended dose of Eliquis as she wanted to decrease her dose to 2.5mg  BID due to bruising (noted 11/14/18). She qualifies for 5mg  BID dosing based on age, weight, and renal function. If she starts estradiol, may be worth increasing dose back to 5mg  BID due to increased risk for blood clots on estrogen therapy.

## 2019-02-16 NOTE — Telephone Encounter (Signed)
Spoke with pt and made her aware of recommendations.  Pt verbalized understanding and was appreciative for call.  

## 2019-03-05 ENCOUNTER — Other Ambulatory Visit: Payer: Self-pay | Admitting: Internal Medicine

## 2019-03-06 ENCOUNTER — Other Ambulatory Visit: Payer: Self-pay | Admitting: Interventional Cardiology

## 2019-03-18 ENCOUNTER — Telehealth: Payer: Self-pay | Admitting: Interventional Cardiology

## 2019-03-18 NOTE — Telephone Encounter (Signed)
New Message   Patient is calling because she received a recall letter to see Dr. Tamala Julian. She only wants to see Dr. Tamala Julian I did offer her a PA she adamantly declined. Dr. Tamala Julian first available 8/26 was offered to her as well. She does not want to wait. She wants the nurse to schedule her sooner but only with Dr. Tamala Julian. . Please call.

## 2019-03-18 NOTE — Telephone Encounter (Signed)
Spoke with pt and scheduled her to see Dr. Tamala Julian 7/10.  Screened pt for COVID.      COVID-19 Pre-Screening Questions:  . In the past 7 to 10 days have you had a cough,  shortness of breath, headache, congestion, fever (100 or greater) body aches, chills, sore throat, or sudden loss of taste or sense of smell? No . Have you been around anyone with known Covid 19? No . Have you been around anyone who is awaiting Covid 19 test results in the past 7 to 10 days? No . Have you been around anyone who has been exposed to Covid 19, or has mentioned symptoms of Covid 19 within the past 7 to 10 days? No  If you have any concerns/questions about symptoms patients report during screening (either on the phone or at [facility]). Contact the provider seeing the patient or DOD for further guidance.  If neither are available contact a member of the leadership team.

## 2019-03-19 NOTE — Progress Notes (Signed)
Cardiology Office Note:    Date:  03/20/2019   ID:  Hailey [name], DOB 04-06-1935, MRN [id number]  PCP:  Cassandria Anger, MD  Cardiologist:  Sinclair Grooms, MD   Referring MD: Cassandria Anger, MD   Chief Complaint  Patient presents with  . Coronary Artery Disease  . Atrial Fibrillation  . Fatigue    History of Present Illness:    Hailey [name] is a 84 y.o. female with a hx of PSVT, paroxysmal atrial fibrillationonchronic anticoagulation,CAD with PCI DES of RCA 2018,and hypertension.  Abnormal prior myocardial perfusion imaging 2017.  Having intermittent episodes of sudden weakness.  This can occur when showering.  Can also occur during the day or when shopping.  She has a sit down for 5 or 10 minutes and it goes away.  Most other times, she feels full of energy and can walk all over [place] without significant limitations.  Denies palpitations.  Denies chest pain.  No lower extremity swelling.  Past Medical History:  Diagnosis Date  . Acute blood loss anemia 12/25/2017  . Allergic rhinitis   . Anxiety   . Arthritis    "my whole spine" (07/01/2017)  . Atrial fibrillation ([place])   . Bowel obstruction ([place])    in [place]  . Bradycardia with 41-50 beats per minute 12/27/2017  . Cellulitis of leg, right with large prepatella hematoma and open wounds 12/26/2017  . CKD (chronic kidney disease) stage 2, GFR 60-89 ml/min   . Colon polyps   . Coronary artery disease    10/18 PCI/DES to p/m LCx with cutting balloon to mLcx  . Diverticulosis of colon   . GERD (gastroesophageal reflux disease)   . Hip bursitis 2010   Dr Para March, Post op seroma  . History of colon polyps   . HTN (hypertension)   . IBS (irritable bowel syndrome)    constipation predominant - Dr Earlean Shawl  . Lichen sclerosus   . Osteopenia 11/2016   T score -2.0 FRAX 15%/4.3%  . PAC (premature atrial contraction)    Symptomatiic  . Right bundle branch block (RBBB) on electrocardiogram (ECG) 12/27/2017   . Scoliosis   . SVT (supraventricular tachycardia) ([place])    brief history    Past Surgical History:  Procedure Laterality Date  . ANTERIOR AND POSTERIOR VAGINAL REPAIR  01/2002   Archie Endo 01/23/2011  . APPENDECTOMY  1948  . CARDIAC CATHETERIZATION  06/26/2017  . CORONARY ANGIOPLASTY WITH STENT PLACEMENT  07/01/2017  . CORONARY ATHERECTOMY N/A 07/01/2017   Procedure: CORONARY ATHERECTOMY;  Surgeon: Belva Crome, MD;  Location: [facility];  Service: Cardiovascular;  Laterality: N/A;  . CORONARY STENT INTERVENTION N/A 07/01/2017   Procedure: CORONARY STENT INTERVENTION;  Surgeon: Belva Crome, MD;  Location: [facility];  Service: Cardiovascular;  Laterality: N/A;  . HAMMER TOE SURGERY    . HEMORRHOID BANDING    . HIP SURGERY Left 04/2009   hip examination under anesthesia followed by greater trochanteric bursectomy; iliotibial band tenotomy/notes 01/20/2011  . I&D EXTREMITY Right 01/10/2018   Procedure: IRRIGATION AND DEBRIDEMENT RIGHT KNEE, APPLY WOUND VAC;  Surgeon: Newt Minion, MD;  Location: [facility];  Service: Orthopedics;  Laterality: Right;  . KNEE BURSECTOMY Right 04/2009   Archie Endo 01/09/2011  . LEFT HEART CATH AND CORONARY ANGIOGRAPHY N/A 06/26/2017   Procedure: LEFT HEART CATH AND CORONARY ANGIOGRAPHY;  Surgeon: Belva Crome, MD;  Location: [facility];  Service: Cardiovascular;  Laterality: N/A;  .  PUBOVAGINAL SLING  01/2002   Archie Endo 01/23/2011  . REDUCTION MAMMAPLASTY    . TEMPORARY PACEMAKER N/A 07/01/2017   Procedure: TEMPORARY PACEMAKER;  Surgeon: Belva Crome, MD;  Location: [facility];  Service: Cardiovascular;  Laterality: N/A;  . VAGINAL HYSTERECTOMY  01/2002   Vaginal hysterectomy, bilateral salpingo-oophorectomy/notes 01/23/2011    Current Medications: Current Meds  Medication Sig  . acetaminophen (TYLENOL) 500 MG tablet Take 1,000 mg by mouth every 6 (six) hours as needed (Coated or capsules).  Marland Kitchen apixaban (ELIQUIS) 2.5 MG TABS  tablet Take 1 tablet (2.5 mg total) by mouth 2 (two) times daily.  Marland Kitchen atorvastatin (LIPITOR) 80 MG tablet TAKE 1 TABLET AT 6PM.  . cetirizine (ZYRTEC) 10 MG tablet Take 10 mg by mouth daily.  . Clobetasol Prop Emollient Base 0.05 % emollient cream Apply nightly as directed.  . DULoxetine (CYMBALTA) 60 MG capsule Take 60 mg by mouth daily.  Marland Kitchen LORazepam (ATIVAN) 1 MG tablet TAKE ONE TABLET ONCE DAILY AS NEEDED FOR ANXIETY.  . metoprolol succinate (TOPROL-XL) 25 MG 24 hr tablet TAKE 3 TABLETS DAILY WITH OR IMMEDIATELY FOLLOWING A MEAL.  . nitroGLYCERIN (NITROSTAT) 0.4 MG SL tablet Place 0.4 mg under the tongue every 5 (five) minutes as needed for chest pain (Call 911 at 3rd dose within 15 minutes.).  Marland Kitchen NONFORMULARY OR COMPOUNDED ITEM Estradiol 0.02% vag cream prefilled applicators  S: insert appful (1 gram) intravaginally twice weekly.  . pantoprazole (PROTONIX) 40 MG tablet Take 1 tablet (40 mg total) by mouth daily.  . Probiotic Product (ALIGN PO) Take 1 capsule by mouth daily.  . sodium chloride (OCEAN) 0.65 % SOLN nasal spray Place 1 spray into both nostrils as needed for congestion.     Allergies:   Macrobid [nitrofurantoin monohyd macro], Meloxicam, Digoxin and related, Mobic [meloxicam], and Sulfamethoxazole-trimethoprim   Social History   Socioeconomic History  . Marital status: Married    Spouse name: Not on file  . Number of children: 2  . Years of education: Not on file  . Highest education level: Not on file  Occupational History  . Occupation: Patent attorney: RETIRED  Social Needs  . Financial resource strain: Not on file  . Food insecurity    Worry: Not on file    Inability: Not on file  . Transportation needs    Medical: Not on file    Non-medical: Not on file  Tobacco Use  . Smoking status: Former Smoker    Packs/day: 0.25    Years: 28.00    Pack years: 7.00    Types: Cigarettes    Quit date: 1981    Years since quitting: 39.5  . Smokeless tobacco:  Never Used  Substance and Sexual Activity  . Alcohol use: Yes    Alcohol/week: 14.0 standard drinks    Types: 14 Shots of liquor per week    Comment: 07/01/2017 "couple shots/day"  . Drug use: No  . Sexual activity: Yes    Birth control/protection: Surgical    Comment: 1st intercourse 84 yo--Fewer than 5 partners  Lifestyle  . Physical activity    Days per week: Not on file    Minutes per session: Not on file  . Stress: Not on file  Relationships  . Social Herbalist on phone: Not on file    Gets together: Not on file    Attends religious service: Not on file    Active member of club or organization:  Not on file    Attends meetings of clubs or organizations: Not on file    Relationship status: Not on file  Other Topics Concern  . Not on file  Social History Narrative   Regular Exercise -  YES           Family History: The patient's family history includes Colon cancer in her mother; Diabetes in her father; Pancreatic cancer in her brother; Prostate cancer in her brother and father; Stomach cancer in her son. There is no history of Heart attack or Stroke.  ROS:   Please see the history of present illness.    Still concerned about diffuse arm and leg discoloration and ecchymoses.  She is on lower dose of apixaban because of this.  All other systems reviewed and are negative.  EKGs/Labs/Other Studies Reviewed:    The following studies were reviewed today: Myocardial perfusion study January 2020 Study Highlights   Nuclear stress EF: 71%. The left ventricular ejection fraction is hyperdynamic (>65%).  This is a low risk study. No evidence of ischemia or previous infarction  The study is normal.        EKG:  EKG not repeated  Recent Labs: 05/22/2018: ALT 13; BUN 27; Creatinine, Ser 1.35; Hemoglobin 11.5; Platelets 239.0; Potassium 4.4; Sodium 139  Recent Lipid Panel    Component Value Date/Time   CHOL 171 11/26/2016 0844   TRIG 99 11/26/2016 0844    TRIG 183 (H) 07/26/2006 0903   HDL 90 11/26/2016 0844   CHOLHDL 1.9 11/26/2016 0844   CHOLHDL 2.2 07/04/2014 0528   VLDL 19 07/04/2014 0528   LDLCALC 61 11/26/2016 0844   LDLDIRECT 89.1 06/29/2013 1604    Physical Exam:    VS:  BP 118/68   Pulse (!) 59   Ht 5\' 3"  (1.6 m)   Wt 164 lb 12.8 oz (74.8 kg)   SpO2 98%   BMI 29.19 kg/m     Wt Readings from Last 3 Encounters:  03/20/19 164 lb 12.8 oz (74.8 kg)  02/05/19 161 lb (73 kg)  11/11/18 161 lb (73 kg)     GEN: Appears younger than stated age. No acute distress HEENT: Normal NECK: No JVD. LYMPHATICS: No lymphadenopathy CARDIAC: Irregular RR without murmur, gallop, or edema. VASCULAR:  Normal Pulses. No bruits. RESPIRATORY:  Clear to auscultation without rales, wheezing or rhonchi  ABDOMEN: Soft, non-tender, non-distended, No pulsatile mass, MUSCULOSKELETAL: No deformity  SKIN: Arms and legs are discolored with resolving ecchymoses. NEUROLOGIC:  Alert and oriented x 3 PSYCHIATRIC:  Normal affect   ASSESSMENT:    1. Coronary artery disease involving native coronary artery of native heart with angina pectoris ([place])   2. Essential hypertension   3. Permanent atrial fibrillation   4. Chronic anticoagulation   5. CKD (chronic kidney disease), stage II   6. Hyperlipidemia LDL goal <70   7. Right bundle branch block (RBBB) on electrocardiogram (ECG)    PLAN:    In order of problems listed above:  1. Sent low risk nuclear study 2. Blood pressure target is being achieved 3. Recent episodes of fatigue and weakness could be related to intermittent atrial fibrillation.  30-day monitor. 4. Still has significant concerns about ecchymoses on arms and legs.  She is on lower dose apixaban and understands that the current dose is subtherapeutic. 5. No discussion 6. Target LDL should be strive for  30-day monitor to rule out arrhythmia causing intermittent fatigue/weakness  Clinical follow-up in 6 months   Medication  Adjustments/Labs and Tests Ordered: Current medicines are reviewed at length with the patient today.  Concerns regarding medicines are outlined above.  Orders Placed This Encounter  Procedures  . Cardiac event monitor   No orders of the defined types were placed in this encounter.   Patient Instructions  Medication Instructions:  Your physician recommends that you continue on your current medications as directed. Please refer to the Current Medication list given to you today.  If you need a refill on your cardiac medications before your next appointment, please call your pharmacy.   Lab work: None If you have labs (blood work) drawn today and your tests are completely normal, you will receive your results only by: Marland Kitchen MyChart Message (if you have MyChart) OR . A paper copy in the mail If you have any lab test that is abnormal or we need to change your treatment, we will call you to review the results.  Testing/Procedures: Your physician has recommended that you wear an event monitor. Event monitors are medical devices that record the heart's electrical activity. Doctors most often [place] these monitors to diagnose arrhythmias. Arrhythmias are problems with the speed or rhythm of the heartbeat. The monitor is a small, portable device. You can wear one while you do your normal daily activities. This is usually used to diagnose what is causing palpitations/syncope (passing out).   Follow-Up: At [facility], you and your health needs are our priority.  As part of our continuing mission to provide you with exceptional heart care, we have created designated Provider Care Teams.  These Care Teams include your primary Cardiologist (physician) and Advanced Practice Providers (APPs -  Physician Assistants and Nurse Practitioners) who all work together to provide you with the care you need, when you need it. You will need a follow up appointment in 6 months.  Please call our office 2 months in advance  to schedule this appointment.  You may see Sinclair Grooms, MD or one of the following Advanced Practice Providers on your designated Care Team:   Truitt Merle, NP Cecilie Kicks, NP . Kathyrn Drown, NP  Any Other Special Instructions Will Be Listed Below (If Applicable).       Signed, Sinclair Grooms, MD  03/20/2019 3:23 PM    [facility]

## 2019-03-20 ENCOUNTER — Ambulatory Visit (INDEPENDENT_AMBULATORY_CARE_PROVIDER_SITE_OTHER): Payer: Medicare Other | Admitting: Interventional Cardiology

## 2019-03-20 ENCOUNTER — Other Ambulatory Visit: Payer: Self-pay

## 2019-03-20 ENCOUNTER — Telehealth: Payer: Self-pay | Admitting: Radiology

## 2019-03-20 ENCOUNTER — Encounter: Payer: Self-pay | Admitting: Interventional Cardiology

## 2019-03-20 VITALS — BP 118/68 | HR 59 | Ht 63.0 in | Wt 164.8 lb

## 2019-03-20 DIAGNOSIS — N182 Chronic kidney disease, stage 2 (mild): Secondary | ICD-10-CM

## 2019-03-20 DIAGNOSIS — I25119 Atherosclerotic heart disease of native coronary artery with unspecified angina pectoris: Secondary | ICD-10-CM

## 2019-03-20 DIAGNOSIS — Z7901 Long term (current) use of anticoagulants: Secondary | ICD-10-CM | POA: Diagnosis not present

## 2019-03-20 DIAGNOSIS — I10 Essential (primary) hypertension: Secondary | ICD-10-CM

## 2019-03-20 DIAGNOSIS — I4821 Permanent atrial fibrillation: Secondary | ICD-10-CM | POA: Diagnosis not present

## 2019-03-20 DIAGNOSIS — I4510 Unspecified right bundle-branch block: Secondary | ICD-10-CM

## 2019-03-20 DIAGNOSIS — E785 Hyperlipidemia, unspecified: Secondary | ICD-10-CM

## 2019-03-20 NOTE — Patient Instructions (Addendum)
Medication Instructions:  Your physician recommends that you continue on your current medications as directed. Please refer to the Current Medication list given to you today.  If you need a refill on your cardiac medications before your next appointment, please call your pharmacy.   Lab work: None If you have labs (blood work) drawn today and your tests are completely normal, you will receive your results only by: Marland Kitchen MyChart Message (if you have MyChart) OR . A paper copy in the mail If you have any lab test that is abnormal or we need to change your treatment, we will call you to review the results.  Testing/Procedures: Your physician has recommended that you wear an event monitor. Event monitors are medical devices that record the heart's electrical activity. Doctors most often [place] these monitors to diagnose arrhythmias. Arrhythmias are problems with the speed or rhythm of the heartbeat. The monitor is a small, portable device. You can wear one while you do your normal daily activities. This is usually used to diagnose what is causing palpitations/syncope (passing out).   Follow-Up: At [facility], you and your health needs are our priority.  As part of our continuing mission to provide you with exceptional heart care, we have created designated Provider Care Teams.  These Care Teams include your primary Cardiologist (physician) and Advanced Practice Providers (APPs -  Physician Assistants and Nurse Practitioners) who all work together to provide you with the care you need, when you need it. You will need a follow up appointment in 6 months.  Please call our office 2 months in advance to schedule this appointment.  You may see Sinclair Grooms, MD or one of the following Advanced Practice Providers on your designated Care Team:   Truitt Merle, NP Cecilie Kicks, NP . Kathyrn Drown, NP  Any Other Special Instructions Will Be Listed Below (If Applicable).

## 2019-03-20 NOTE — Telephone Encounter (Signed)
Enrolled patient for a 30 Day [employer] Event monitor to be mailed. Patient knows to expect the monitor to arrive in 3-4 days

## 2019-03-24 ENCOUNTER — Ambulatory Visit: Payer: Self-pay | Admitting: *Deleted

## 2019-03-24 ENCOUNTER — Ambulatory Visit (INDEPENDENT_AMBULATORY_CARE_PROVIDER_SITE_OTHER): Payer: Medicare Other | Admitting: Internal Medicine

## 2019-03-24 ENCOUNTER — Encounter: Payer: Self-pay | Admitting: Internal Medicine

## 2019-03-24 DIAGNOSIS — I10 Essential (primary) hypertension: Secondary | ICD-10-CM | POA: Diagnosis not present

## 2019-03-24 DIAGNOSIS — K529 Noninfective gastroenteritis and colitis, unspecified: Secondary | ICD-10-CM

## 2019-03-24 DIAGNOSIS — K219 Gastro-esophageal reflux disease without esophagitis: Secondary | ICD-10-CM | POA: Diagnosis not present

## 2019-03-24 DIAGNOSIS — I480 Paroxysmal atrial fibrillation: Secondary | ICD-10-CM | POA: Diagnosis not present

## 2019-03-24 MED ORDER — ESOMEPRAZOLE MAGNESIUM 40 MG PO CPDR: 40.0000 mg | DELAYED_RELEASE_CAPSULE | Freq: Every day | ORAL | 3 refills | Status: DC

## 2019-03-24 NOTE — Telephone Encounter (Signed)
VOV made

## 2019-03-24 NOTE — Telephone Encounter (Signed)
I'm not having symptoms of COVID-19.   I'm having issues with my gastro system. Woke up yesterday morning with lower middle abd pain.   Had an episode of diarrhea and vomited once yesterday.    I'm feeling very weak when I'm up and moving around.  See triage notes.  I warm transferred her call to [facility] in Dr. Judeen Hammans office to be scheduled.  I sent my notes to the office.  Reason for Disposition . Age > 60 years  Answer Assessment - Initial Assessment Questions 1. LOCATION: "Where does it hurt?"      I'm having abdominal pain in the lower center below my navel. 2. RADIATION: "Does the pain shoot anywhere else?" (e.g., chest, back)     No 3. ONSET: "When did the pain begin?" (e.g., minutes, hours or days ago)      Yesterday 4. SUDDEN: "Gradual or sudden onset?"     Suddenly after breakfast. 5. PATTERN "Does the pain come and go, or is it constant?"    - If constant: "Is it getting better, staying the same, or worsening?"      (Note: Constant means the pain never goes away completely; most serious pain is constant and it progresses)     - If intermittent: "How long does it last?" "Do you have pain now?"     (Note: Intermittent means the pain goes away completely between bouts)     Mostly constant since yesterday morning. 6. SEVERITY: "How bad is the pain?"  (e.g., Scale 1-10; mild, moderate, or severe)   - MILD (1-3): doesn't interfere with normal activities, abdomen soft and not tender to touch    - MODERATE (4-7): interferes with normal activities or awakens from sleep, tender to touch    - SEVERE (8-10): excruciating pain, doubled over, unable to do any normal activities      4-5  Having a lot of gas 7. RECURRENT SYMPTOM: "Have you ever had this type of abdominal pain before?" If so, ask: "When was the last time?" and "What happened that time?"      Yes.  I've had gastro problems before.   Diverticulitis.   Also a blockage but did not have surgery. 8. CAUSE: "What do you  think is causing the abdominal pain?"     No.  I've not eaten since yesterday morning with toast.   I did vomit yesterday.   I still feel nauseas. 9. RELIEVING/AGGRAVATING FACTORS: "What makes it better or worse?" (e.g., movement, antacids, bowel movement)     Took a pill my doctor gave me last night.   That helped.   I also took [place]. 10. OTHER SYMPTOMS: "Has there been any vomiting, diarrhea, constipation, or urine problems?"       I've had some diarrhea. I feel weak when I get up and move around.   Like I want to go back to bed.   Denies dizziness.    I felt weak all day yesterday.   I slept on and off yesterday.   11. PREGNANCY: "Is there any chance you are pregnant?" "When was your last menstrual period?"       Not asked due to age.  Protocols used: ABDOMINAL PAIN [facility]

## 2019-03-24 NOTE — Assessment & Plan Note (Signed)
Feeling better COVID test tomorrow if not well Protonix bid Hydration po Call if "diverticulitis sx's" - no diverticulitis sx's per pt - she is better

## 2019-03-24 NOTE — Assessment & Plan Note (Addendum)
Taking Protonix - not helping Off Nexium will re-start

## 2019-03-24 NOTE — Progress Notes (Signed)
Virtual Visit via Video Note  I connected with Hailey [name] on 03/24/19 at  1:20 PM EDT by a video enabled telemedicine application and verified that I am speaking with the correct person using two identifiers.   I discussed the limitations of evaluation and management by telemedicine and the availability of in person appointments. The patient expressed understanding and agreed to proceed.  History of Present Illness:  The patient is complaining of nausea, vomiting and on several black stools since yesterday.  There is no fever.  She is better today.  She ate breakfast (oatmeal) and it stayed down.  She took Librax.  It helped.  There is no COVID-19-like symptoms present otherwise. Call if "diverticulitis sx's" - no diverticulitis sx's per pt - she is better Follow-up on GERD-the patient does not seem to have as much relief with Protonix as she had before with Nexium. Follow-up atrial fibrillation-no tachycardia or chest pain  There has been no runny nose, cough, chest pain, shortness of breath, constipation, arthralgias, skin rashes.   Observations/Objective: The patient appears to be in no acute distress, looks well.  Assessment and Plan:  See my Assessment and Plan. Follow Up Instructions:    I discussed the assessment and treatment plan with the patient. The patient was provided an opportunity to ask questions and all were answered. The patient agreed with the plan and demonstrated an understanding of the instructions.   The patient was advised to call back or seek an in-person evaluation if the symptoms worsen or if the condition fails to improve as anticipated.  I provided face-to-face time during this encounter. We were at different locations.   Walker Kehr, MD

## 2019-03-24 NOTE — Assessment & Plan Note (Signed)
Heart rate controlled.  Continue with Eliquis and metoprolol

## 2019-03-26 ENCOUNTER — Other Ambulatory Visit: Payer: Self-pay | Admitting: Internal Medicine

## 2019-03-26 ENCOUNTER — Other Ambulatory Visit: Payer: Self-pay

## 2019-03-26 ENCOUNTER — Telehealth: Payer: Self-pay | Admitting: *Deleted

## 2019-03-26 MED ORDER — METRONIDAZOLE 500 MG PO TABS: 500.0000 mg | ORAL_TABLET | Freq: Two times a day (BID) | ORAL | 0 refills | Status: DC

## 2019-03-26 MED ORDER — CEFUROXIME AXETIL 500 MG PO TABS: 500.0000 mg | ORAL_TABLET | Freq: Two times a day (BID) | ORAL | 0 refills | Status: DC

## 2019-03-26 NOTE — Telephone Encounter (Signed)
Patient called c/o lower abdomen discomfort x 4 days now, notes vaginal itching only, uses compound estrogen cream twice weekly. I suggested she schedule visit for exam, transferred to appointment desk to schedule.

## 2019-03-27 ENCOUNTER — Encounter: Payer: Self-pay | Admitting: Gynecology

## 2019-03-27 ENCOUNTER — Ambulatory Visit (INDEPENDENT_AMBULATORY_CARE_PROVIDER_SITE_OTHER): Payer: Medicare Other | Admitting: Gynecology

## 2019-03-27 VITALS — BP 120/70

## 2019-03-27 DIAGNOSIS — R102 Pelvic and perineal pain: Secondary | ICD-10-CM

## 2019-03-27 NOTE — Progress Notes (Signed)
    Hailey [name] July 29, 1935 [id number]        84 y.o.  G2P2001 presents having had nausea and diarrhea several days ago.  It transiently improved although she is having lower abdominal cramping.  She contacted her primary physician who discussed possible diverticulitis and prescribed Ceftin and Flagyl although she has not started this yet because she is feeling better.  She has had a history of UTIs in the past and wanted to come here to be checked.  She is status post hysterectomy BSO in the past.  Past medical history,surgical history, problem list, medications, allergies, family history and social history were all reviewed and documented in the EPIC chart.  Directed ROS with pertinent positives and negatives documented in the history of present illness/assessment and plan.  Exam: [name] assistant Vitals:   03/27/19 1138  BP: 120/70   General appearance:  Normal Abdomen with active bowel sounds throughout.  Soft nontender without masses guarding rebound Pelvic external BUS vagina with atrophic changes.  Bimanual exam is normal.  Rectal exam is normal  Assessment/Plan:  84 y.o. G2P2001 with history as above.  Suspect GI as etiology of her abdominal cramping and discomfort.  Urine analysis appears contaminated.  Will follow up on culture.  As patient is feeling better she is going to hold on her antibiotics prescribed elsewhere and see how she feels over the next several days.  If her symptoms would continue she will start the antibiotics as prescribed by Dr. Alain Marion and follow-up with him.  If urine culture shows UTI then will cover with a more appropriate urine antibiotic.    Anastasio Auerbach MD, 11:55 AM 03/27/2019

## 2019-03-27 NOTE — Patient Instructions (Signed)
Office will call you with your urine culture results.

## 2019-03-29 ENCOUNTER — Other Ambulatory Visit: Payer: Self-pay | Admitting: Internal Medicine

## 2019-03-31 ENCOUNTER — Telehealth: Payer: Self-pay | Admitting: Interventional Cardiology

## 2019-03-31 ENCOUNTER — Encounter (INDEPENDENT_AMBULATORY_CARE_PROVIDER_SITE_OTHER): Payer: Medicare Other

## 2019-03-31 ENCOUNTER — Encounter: Payer: Self-pay | Admitting: Interventional Cardiology

## 2019-03-31 DIAGNOSIS — I4821 Permanent atrial fibrillation: Secondary | ICD-10-CM

## 2019-03-31 NOTE — Telephone Encounter (Signed)
Called the pt back and informed her that I spoke with our monitor tech about swimming, and she endorsed that she can swim up to 3 feet below water, with the monitor version she has on. Also informed the pt that Dr Tamala Julian reviewed her monitor and she should continue her current regimen and we will continue to monitor. Pt verbalized understanding and agrees with this plan.

## 2019-03-31 NOTE — Telephone Encounter (Signed)
Follow Up:   Hailey [name] said she was returning a call and told to ask for a triage nurse.

## 2019-03-31 NOTE — Telephone Encounter (Signed)
Baseline reading showed to Dr. Tamala Julian, and no changes to be made at this time, continue to monitor. Pt was aware that we would only call her back if changes were to be made.  Monitor placed in medical records basket to be scanned.

## 2019-03-31 NOTE — Telephone Encounter (Signed)
Will forward this message to [facility], to follow-up with Watt Climes at [place] about this pts monitor, for I confirmed with Dr. Darliss Ridgel RN that the pt is currently not wearing the monitor yet.

## 2019-03-31 NOTE — Telephone Encounter (Signed)
Spoke with our monitor Tech and pt just initiated putting her monitor on and baseline is showing afib with a rate of 90 bpm.  Will obtain these strips and take this to the pts Cardiologist for further review and recommendation.   Talked to the pt also and she said she is asymptomatic with no cardiac complaints all day.  Pt states she has been playing bridge all day and has had a very restful day.  Pt states that she is always in afib, and would like to try to avoid wearing the monitor the full 30 days, for she states she has a pool and likes to swim. Advised the pt to continue wearing her monitor for now, and I will go and show her reading to Dr. Tamala Julian, and we will follow-up with her thereafter, if changes need to be made.  Pt verbalized understanding and agrees with this plan.

## 2019-03-31 NOTE — Telephone Encounter (Signed)
Patient is currently wearing a [employer] Event monitor.

## 2019-04-01 ENCOUNTER — Other Ambulatory Visit: Payer: Self-pay | Admitting: Internal Medicine

## 2019-04-01 ENCOUNTER — Telehealth: Payer: Self-pay

## 2019-04-01 LAB — URINALYSIS, COMPLETE W/RFL CULTURE
Bilirubin Urine: NEGATIVE
Glucose, UA: NEGATIVE
Hgb urine dipstick: NEGATIVE
Hyaline Cast: NONE SEEN /LPF
Ketones, ur: NEGATIVE
Leukocyte Esterase: NEGATIVE
Nitrites, Initial: NEGATIVE
Protein, ur: NEGATIVE
RBC / HPF: NONE SEEN /HPF (ref 0–2)
Specific Gravity, Urine: 1.015 (ref 1.001–1.03)
pH: 5.5 (ref 5.0–8.0)

## 2019-04-01 LAB — URINE CULTURE
MICRO NUMBER:: 682237
SPECIMEN QUALITY:: ADEQUATE

## 2019-04-01 LAB — CULTURE INDICATED

## 2019-04-01 MED ORDER — LORAZEPAM 1 MG PO TABS: 1.0000 mg | ORAL_TABLET | Freq: Two times a day (BID) | ORAL | 1 refills | Status: DC | PRN

## 2019-04-01 MED ORDER — HYOSCYAMINE SULFATE 0.125 MG PO TABS: 0.1250 mg | ORAL_TABLET | ORAL | 3 refills | Status: DC | PRN

## 2019-04-01 NOTE — Telephone Encounter (Signed)
Not needed. Opened in error

## 2019-04-01 NOTE — Telephone Encounter (Signed)
-----   Message from Anastasio Auerbach, MD sent at 04/01/2019  4:05 PM EDT ----- Check with patient and see how she is feeling.  Her urine did grow out a little bit of bacteria although the colony count was very low.  If she is feeling better but without urinary symptoms and I would monitor.  If she is feeling suprapubic discomfort or any urinary symptoms and we can cover her with ampicillin 500 mg 4 times daily x5 days.

## 2019-04-03 ENCOUNTER — Telehealth: Payer: Self-pay | Admitting: Interventional Cardiology

## 2019-04-03 NOTE — Telephone Encounter (Signed)
lpmtcb 7/24

## 2019-04-03 NOTE — Telephone Encounter (Signed)
New Message           Patient is asking for a call back concerning the heart monitor she is wearing.

## 2019-04-03 NOTE — Telephone Encounter (Signed)
F/U Message          Patient is returning Hailey [name]'s call but prefers [name] to call her back when she comes back in the office on Monday

## 2019-04-06 NOTE — Telephone Encounter (Signed)
Spoke with pt and she states that she couldn't get the patch to contact well. Phone kept saying not connected.  Pt states that she wore the monitor about 2 days and took it off because it was too much to deal with.  She tried changing the patch and it didn't work and when she took it off, she had a rash.  Pt is going to go ahead and mail the monitor back.  Advised to continue to monitor with her Apple watch and let us know how often she has Afib, how long it lasts and her rate when in it.  Pt verbalized understanding and was in agreement with this plan.

## 2019-04-13 ENCOUNTER — Telehealth: Payer: Self-pay | Admitting: Internal Medicine

## 2019-04-13 DIAGNOSIS — R197 Diarrhea, unspecified: Secondary | ICD-10-CM

## 2019-04-13 DIAGNOSIS — R109 Unspecified abdominal pain: Secondary | ICD-10-CM

## 2019-04-13 NOTE — Telephone Encounter (Signed)
Spoke with pt and she is aware. Orders in epic for labs and she knows to pick up the IB Guard OTC.

## 2019-04-13 NOTE — Telephone Encounter (Signed)
Patient called said that she has Nausea , loss bowels and some stomach cramping for a while and would like to speak to someone

## 2019-04-13 NOTE — Telephone Encounter (Signed)
Pt states she has been having issues with abdominal, cramping pain followed by a little diarrhea. States this happens for several days and then she gets better. States the symptoms repeat in the next several weeks. Pt scheduled to see Dr. Henrene Pastor 05/11/19@10 :40am and added to the cancellation list. Pt wants to know if Dr. Henrene Pastor wants her to have any labs or testing prior to the appt. Please advise.

## 2019-04-13 NOTE — Telephone Encounter (Signed)
1.  Have her come in for CBC, comprehensive metabolic panel, serum IgA level, and tissue transglutaminase antibody IgA 2.  Have her pick up IBgard at the pharmacy.  Take 1 twice daily until office follow-up (she has tried this in the past)

## 2019-04-16 ENCOUNTER — Other Ambulatory Visit (INDEPENDENT_AMBULATORY_CARE_PROVIDER_SITE_OTHER): Payer: Medicare Other

## 2019-04-16 DIAGNOSIS — R109 Unspecified abdominal pain: Secondary | ICD-10-CM

## 2019-04-16 DIAGNOSIS — R197 Diarrhea, unspecified: Secondary | ICD-10-CM | POA: Diagnosis not present

## 2019-04-16 LAB — COMPREHENSIVE METABOLIC PANEL
ALT: 9 U/L (ref 0–35)
AST: 18 U/L (ref 0–37)
Albumin: 4.2 g/dL (ref 3.5–5.2)
Alkaline Phosphatase: 56 U/L (ref 39–117)
BUN: 24 mg/dL — ABNORMAL HIGH (ref 6–23)
CO2: 24 mEq/L (ref 19–32)
Calcium: 8.9 mg/dL (ref 8.4–10.5)
Chloride: 105 mEq/L (ref 96–112)
Creatinine, Ser: 1.24 mg/dL — ABNORMAL HIGH (ref 0.40–1.20)
GFR: 41.37 mL/min — ABNORMAL LOW (ref >60.00)
Glucose, Bld: 106 mg/dL — ABNORMAL HIGH (ref 70–99)
Potassium: 4.4 mEq/L (ref 3.5–5.1)
Sodium: 137 mEq/L (ref 135–145)
Total Bilirubin: 0.6 mg/dL (ref 0.2–1.2)
Total Protein: 6.7 g/dL (ref 6.0–8.3)

## 2019-04-16 LAB — CBC WITH DIFFERENTIAL/PLATELET
Basophils Absolute: 0 10*3/uL (ref 0.0–0.1)
Basophils Relative: 0.5 % (ref 0.0–3.0)
Eosinophils Absolute: 0.4 10*3/uL (ref 0.0–0.7)
Eosinophils Relative: 5.6 % — ABNORMAL HIGH (ref 0.0–5.0)
HCT: 34.4 % — ABNORMAL LOW (ref 36.0–46.0)
Hemoglobin: 11.6 g/dL — ABNORMAL LOW (ref 12.0–15.0)
Lymphocytes Relative: 23.2 % (ref 12.0–46.0)
Lymphs Abs: 1.6 10*3/uL (ref 0.7–4.0)
MCHC: 33.8 g/dL (ref 30.0–36.0)
MCV: 97 fl (ref 78.0–100.0)
Monocytes Absolute: 0.7 10*3/uL (ref 0.1–1.0)
Monocytes Relative: 10.3 % (ref 3.0–12.0)
Neutro Abs: 4.1 10*3/uL (ref 1.4–7.7)
Neutrophils Relative %: 60.4 % (ref 43.0–77.0)
Platelets: 203 10*3/uL (ref 150.0–400.0)
RBC: 3.55 Mil/uL — ABNORMAL LOW (ref 3.87–5.11)
RDW: 14.4 % (ref 11.5–15.5)
WBC: 6.8 10*3/uL (ref 4.0–10.5)

## 2019-04-16 LAB — IGA: IgA: 132 mg/dL (ref 68–378)

## 2019-04-17 LAB — TISSUE TRANSGLUTAMINASE, IGA: (tTG) Ab, IgA: 1 U/mL

## 2019-04-20 ENCOUNTER — Telehealth: Payer: Self-pay | Admitting: Internal Medicine

## 2019-04-20 NOTE — Telephone Encounter (Signed)
Spoke with pt and discussed with her to stop the IB guard if it is making her nauseated and to start taking the Levsin that her PCP prescribed and that should help with her cramping. Pt verbalized understanding.

## 2019-05-11 ENCOUNTER — Encounter: Payer: Self-pay | Admitting: Internal Medicine

## 2019-05-11 ENCOUNTER — Ambulatory Visit (INDEPENDENT_AMBULATORY_CARE_PROVIDER_SITE_OTHER): Payer: Medicare Other | Admitting: Internal Medicine

## 2019-05-11 VITALS — BP 106/80 | HR 53 | Temp 97.3°F | Ht 62.75 in | Wt 164.0 lb

## 2019-05-11 DIAGNOSIS — R109 Unspecified abdominal pain: Secondary | ICD-10-CM

## 2019-05-11 DIAGNOSIS — K589 Irritable bowel syndrome without diarrhea: Secondary | ICD-10-CM

## 2019-05-11 DIAGNOSIS — K219 Gastro-esophageal reflux disease without esophagitis: Secondary | ICD-10-CM | POA: Diagnosis not present

## 2019-05-11 NOTE — Patient Instructions (Signed)
Please follow up as needed.  If you are age 82 or older, your body mass index should be between 23-30. Your Body mass index is 29.28 kg/m. If this is out of the aforementioned range listed, please consider follow up with your Primary Care Provider.  If you are age 80 or younger, your body mass index should be between 19-25. Your Body mass index is 29.28 kg/m. If this is out of the aformentioned range listed, please consider follow up with your Primary Care Provider.

## 2019-05-11 NOTE — Progress Notes (Signed)
HISTORY OF PRESENT ILLNESS:  Hailey [name] is a 84 y.o. female with multiple medical problems as listed below including atrial fibrillation for which she is now on Eliquis.  Patient presents today with chief complaints of abdominal cramping, bloating, and change in bowel habits.  Her GI history is remarkable for GERD, irritable bowel syndrome, remote diverticulitis, and a family history of colon and gastric cancer.  She established with this practice at the urging of Dr. Lyla Son August 20, 2018.  See that dictation.  At that time the patient's GERD symptoms were controlled with PPI.  Her IBS was described as the alternating form.  She had had multiple surveillance colonoscopies all of which were negative for neoplasia and aged out in 2013.  She was noted to have diverticulosis without recent problems with diverticulitis.  She was advised with regards to reflux precautions, continued on PPI, high-fiber diet, and IBgard as needed for irritable bowel type symptoms.  Patient tells me that she was in her usual state of health until approximately 6 weeks ago when she noticed abdominal cramping with diarrhea.  Some nausea.  This occurred daily for about 1 week.  Treated with Levsin for discomfort which helped.  Thereafter having similar problem several days per week for several weeks.  Currently describes some discomfort after defecation which lasts about 30 minutes.  Described as lower and cramping.  Her bowel sounds have been more active.  No vomiting.  She does not describe her nausea as severe enough to require medication.  I inquired regarding IBgard.  She states this upset her stomach.  I also inquired regarding fiber supplementation such as Metamucil.  Unfortunately this results in diarrhea.  She tells me that she does take a probiotic.  She continues on Nexium for GERD.  No active GERD symptoms.  No dysphasia.  She did undergo abdominal ultrasound September 2019 for abdominal pain.  No significant  abnormalities.  She did have blood work April 16, 2019.  Comprehensive metabolic panel revealed normal liver test.  CBC was unremarkable with hemoglobin 11.6.  Testing for celiac disease was negative or normal.  At this point, though she has improved significantly (though not completely) she wants to reassure there is nothing serious-particular given her family history.  Patient denies bleeding, fevers, or weight loss.  She did have a virtual visit with Dr. Alain Marion March 24, 2019 which I have reviewed.  REVIEW OF SYSTEMS:  All non-GI ROS negative unless otherwise stated in the HPI except for anxiety, arthritis, back pain, heart rhythm change, sleeping problems  Past Medical History:  Diagnosis Date  . Acute blood loss anemia 12/25/2017  . Allergic rhinitis   . Anxiety   . Arthritis    "my whole spine" (07/01/2017)  . Atrial fibrillation ([place])   . Bowel obstruction ([place])    in [place]  . Bradycardia with 41-50 beats per minute 12/27/2017  . Cellulitis of leg, right with large prepatella hematoma and open wounds 12/26/2017  . CKD (chronic kidney disease) stage 2, GFR 60-89 ml/min   . Colon polyps   . Coronary artery disease    10/18 PCI/DES to p/m LCx with cutting balloon to mLcx  . Diverticulosis of colon   . GERD (gastroesophageal reflux disease)   . Hip bursitis 2010   Dr Para March, Post op seroma  . History of colon polyps   . HTN (hypertension)   . IBS (irritable bowel syndrome)    constipation predominant - Dr Earlean Shawl  . Lichen sclerosus   .  Osteopenia 11/2016   T score -2.0 FRAX 15%/4.3%  . PAC (premature atrial contraction)    Symptomatiic  . Right bundle branch block (RBBB) on electrocardiogram (ECG) 12/27/2017  . Scoliosis   . SVT (supraventricular tachycardia) ([place])    brief history    Past Surgical History:  Procedure Laterality Date  . ANTERIOR AND POSTERIOR VAGINAL REPAIR  01/2002   Archie Endo 01/23/2011  . APPENDECTOMY  1948  . CARDIAC CATHETERIZATION  06/26/2017  .  CORONARY ANGIOPLASTY WITH STENT PLACEMENT  07/01/2017  . CORONARY ATHERECTOMY N/A 07/01/2017   Procedure: CORONARY ATHERECTOMY;  Surgeon: Belva Crome, MD;  Location: [facility];  Service: Cardiovascular;  Laterality: N/A;  . CORONARY STENT INTERVENTION N/A 07/01/2017   Procedure: CORONARY STENT INTERVENTION;  Surgeon: Belva Crome, MD;  Location: [facility];  Service: Cardiovascular;  Laterality: N/A;  . HAMMER TOE SURGERY    . HEMORRHOID BANDING    . HIP SURGERY Left 04/2009   hip examination under anesthesia followed by greater trochanteric bursectomy; iliotibial band tenotomy/notes 01/20/2011  . I&D EXTREMITY Right 01/10/2018   Procedure: IRRIGATION AND DEBRIDEMENT RIGHT KNEE, APPLY WOUND VAC;  Surgeon: Newt Minion, MD;  Location: [facility];  Service: Orthopedics;  Laterality: Right;  . KNEE BURSECTOMY Right 04/2009   Archie Endo 01/09/2011  . LEFT HEART CATH AND CORONARY ANGIOGRAPHY N/A 06/26/2017   Procedure: LEFT HEART CATH AND CORONARY ANGIOGRAPHY;  Surgeon: Belva Crome, MD;  Location: [facility];  Service: Cardiovascular;  Laterality: N/A;  . PUBOVAGINAL SLING  01/2002   Archie Endo 01/23/2011  . REDUCTION MAMMAPLASTY    . TEMPORARY PACEMAKER N/A 07/01/2017   Procedure: TEMPORARY PACEMAKER;  Surgeon: Belva Crome, MD;  Location: [facility];  Service: Cardiovascular;  Laterality: N/A;  . VAGINAL HYSTERECTOMY  01/2002   Vaginal hysterectomy, bilateral salpingo-oophorectomy/notes 01/23/2011    Social History Hailey [name]  reports that she quit smoking about 39 years ago. Her smoking use included cigarettes. She has a 7.00 pack-year smoking history. She has never used smokeless tobacco. She reports current alcohol use of about 14.0 standard drinks of alcohol per week. She reports that she does not use drugs.  family history includes Colon cancer in her mother; Diabetes in her father; Pancreatic cancer in her brother; Prostate cancer in her brother and father;  Stomach cancer in her son.  Allergies  Allergen Reactions  . Macrobid [employer] Macro] Other (See Comments)    Nausea, stomach cramps, fatigue , headache.  . Meloxicam Other (See Comments)    Jittery and headache  . Digoxin And Related     headaches  . Mobic [Meloxicam]   . Sulfamethoxazole-Trimethoprim Nausea Only       PHYSICAL EXAMINATION: Vital signs: BP 106/80   Pulse (!) 53   Temp (!) 97.3 F (36.3 C)   Ht 5' 2.75" (1.594 m)   Wt 164 lb (74.4 kg)   BMI 29.28 kg/m   Constitutional: generally well-appearing, no acute distress Psychiatric: alert and oriented x3, cooperative Eyes: extraocular movements intact, anicteric, conjunctiva pink Mouth: oral pharynx moist, no lesions Neck: supple no lymphadenopathy Cardiovascular: heart irregular rate and rhythm, no murmur Lungs: clear to auscultation bilaterally Abdomen: soft, nontender, nondistended, no obvious ascites, no peritoneal signs, normal bowel sounds, no organomegaly Rectal: Omitted Extremities: no clubbing, cyanosis.  Trace lower extremity edema bilaterally with stasis changes Skin: no lesions on visible extremities Neuro: No focal deficits.  Cranial nerves intact  ASSESSMENT:  1.  IBS.  Recent exacerbation with increased loose stool, low abdominal cramping, and some urgency.  Slowly improving without alarm features 2.  GERD.  Asymptomatic on PPI 3.  Family history of colon and gastric cancer 4.  Diverticulosis with history of diverticulitis 5.  Multiple medical problems   PLAN:  1.  Since she does not tolerate fortified fiber then increase dietary fiber.  We did discuss IBgard which she does not tolerate.  Could consider trial of Xifaxan as to the extent but will hold for now. 2.  Continue Levsin as needed for cramping pain 3.  Reflux precautions 4.  Continue PPI 5.  Expectant management.  Patient has agreed to contact this office should her symptoms worsen or relevant new symptoms develop.   Otherwise she will resume her general medical care with Dr. Alain Marion 25-minute spent face-to-face with patient.  Greater than 50% of time used for counseling regarding her altered bowel habits and abdominal pain.

## 2019-05-19 ENCOUNTER — Ambulatory Visit (INDEPENDENT_AMBULATORY_CARE_PROVIDER_SITE_OTHER): Payer: Medicare Other | Admitting: Orthopedic Surgery

## 2019-05-19 ENCOUNTER — Telehealth: Payer: Self-pay

## 2019-05-19 ENCOUNTER — Encounter: Payer: Self-pay | Admitting: Orthopedic Surgery

## 2019-05-19 VITALS — Ht 64.0 in | Wt 160.0 lb

## 2019-05-19 DIAGNOSIS — I87331 Chronic venous hypertension (idiopathic) with ulcer and inflammation of right lower extremity: Secondary | ICD-10-CM

## 2019-05-19 DIAGNOSIS — L97919 Non-pressure chronic ulcer of unspecified part of right lower leg with unspecified severity: Secondary | ICD-10-CM | POA: Diagnosis not present

## 2019-05-19 NOTE — Telephone Encounter (Signed)
Can you please open time for this pt to come in today at 10:45? worki n for a new wound. Thanks!

## 2019-05-19 NOTE — Progress Notes (Signed)
Office Visit Note   Patient: Hailey [name]           Date of Birth: Dec 13, 1934           MRN: [id number] Visit Date: 05/19/2019              Requested by: Cassandria Anger, MD [place],  [place] [address] PCP: Cassandria Anger, MD  Chief Complaint  Patient presents with  . Right Leg - Wound Check      HPI: Patient is an 84 year old woman who presents with a new acute traumatic venous ulcer right leg proximal lateral.  Patient states she was climbing into a hot tub when she struck her leg she is on blood thinners.  Assessment & Plan: Visit Diagnoses:  1. Chronic venous hypertension (idiopathic) with ulcer and inflammation of right lower extremity ([place])     Plan: Patient was given a sample of the medical compression stocking she was given a prescription for a size medium 2 layer medical compression stocking she will wear 1 L around-the-clock 2 L during the day wash with soap and water stay out of the ocean and hot tubs  Follow-Up Instructions: Return in about 4 weeks (around 06/16/2019).   Ortho Exam  Patient is alert, oriented, no adenopathy, well-dressed, normal affect, normal respiratory effort. Examination patient has chronic venous insufficiency she has a new skin tear of the lateral aspect of her right leg approximately 4 cm in diameter this is partial-thickness with good granulation tissue at the base.  There is no cellulitis no signs of infection.  Imaging: No results found.   Labs: Lab Results  Component Value Date   HGBA1C 5.6 07/04/2014   ESRSEDRATE 31 (H) 08/30/2014   ESRSEDRATE 29 (H) 12/18/2010   ESRSEDRATE 39 (H) 06/14/2009   CRP 0.7 08/30/2014   LABURIC 6.1 06/14/2009   LABORGA ESCHERICHIA COLI 02/19/2017     Lab Results  Component Value Date   ALBUMIN 4.2 04/16/2019   ALBUMIN 4.0 05/22/2018   ALBUMIN 3.4 (L) 12/24/2017   LABURIC 6.1 06/14/2009    No results found for: MG Lab Results  Component Value Date   VD25OH 32  12/12/2016    No results found for: PREALBUMIN CBC EXTENDED Latest Ref Rng & Units 04/16/2019 05/22/2018 02/21/2018  WBC 4.0 - 10.5 K/uL 6.8 6.5 5.3  RBC 3.87 - 5.11 Mil/uL 3.55(L) 3.90 3.48(L)  HGB 12.0 - 15.0 g/dL 11.6(L) 11.5(L) 10.4(L)  HCT 36.0 - 46.0 % 34.4(L) 34.8(L) 31.7(L)  PLT 150.0 - 400.0 K/uL 203.0 239.0 247.0  NEUTROABS 1.4 - 7.7 K/uL 4.1 4.2 3.1  LYMPHSABS 0.7 - 4.0 K/uL 1.6 1.0 1.1     Body mass index is 27.46 kg/m.  Orders:  No orders of the defined types were placed in this encounter.  No orders of the defined types were placed in this encounter.    Procedures: No procedures performed  Clinical Data: No additional findings.  ROS:  All other systems negative, except as noted in the HPI. Review of Systems  Objective: Vital Signs: Ht 5\' 4"  (1.626 m)   Wt 160 lb (72.6 kg)   BMI 27.46 kg/m   Specialty Comments:  No specialty comments available.  PMFS History: Patient Active Problem List   Diagnosis Date Noted  . Viral URI with cough 08/15/2018  . Abdominal pain 05/22/2018  . Traumatic hematoma of right knee 01/10/2018  . Ulcer of right knee ([place])   . Bradycardia with 41-50 beats  per minute 12/27/2017  . Right bundle branch block (RBBB) on electrocardiogram (ECG) 12/27/2017  . Quadriceps tendon rupture 12/24/2017  . CKD (chronic kidney disease), stage II 12/24/2017  . Cellulitis of leg, right with large prepatella hematoma and open wounds 12/24/2017  . Iliotibial band syndrome of right side 12/16/2017  . Depression 07/11/2017  . Intervertebral lumbar disc disorder with myelopathy, lumbar region 04/25/2017  . Gastroenteritis 09/13/2016  . Chronic anticoagulation 05/24/2015  . Coronary artery disease involving native coronary artery of native heart with angina pectoris ([place]) 12/20/2014  . Iliotibial band syndrome of left side 11/16/2014  . Diverticulitis of colon 08/30/2014  . PAF (paroxysmal atrial fibrillation) ([place]) 11/21/2012  . Greater  trochanteric bursitis of both hips 05/21/2012  . 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 Osteoarthritis of left hip 07/03/2010  . [place] DISEASE, LUMBOSACRAL SPINE 05/18/2010  . SYNCOPE 10/27/2008  . Essential hypertension 06/16/2007  . GERD 06/16/2007  . COLONIC POLYPS, HX OF 06/16/2007   Past Medical History:  Diagnosis Date  . Acute blood loss anemia 12/25/2017  . Allergic rhinitis   . Anxiety   . Arthritis    "my whole spine" (07/01/2017)  . Atrial fibrillation ([place])   . Bowel obstruction ([place])    in [place]  . Bradycardia with 41-50 beats per minute 12/27/2017  . Cellulitis of leg, right with large prepatella hematoma and open wounds 12/26/2017  . CKD (chronic kidney disease) stage 2, GFR 60-89 ml/min   . Colon polyps   . Coronary artery disease    10/18 PCI/DES to p/m LCx with cutting balloon to mLcx  . Diverticulosis of colon   . GERD (gastroesophageal reflux disease)   . Hip bursitis 2010   Dr Para March, Post op seroma  . History of colon polyps   . HTN (hypertension)   . IBS (irritable bowel syndrome)    constipation predominant - Dr Earlean Shawl  . Lichen sclerosus   . Osteopenia 11/2016   T score -2.0 FRAX 15%/4.3%  . PAC (premature atrial contraction)    Symptomatiic  . Right bundle branch block (RBBB) on electrocardiogram (ECG) 12/27/2017  . Scoliosis   . SVT (supraventricular tachycardia) ([facility])    brief history    Family History  Problem Relation Age of Onset  . Colon cancer Mother   . Diabetes Father   . Prostate cancer Father   . Prostate cancer Brother   . Pancreatic cancer Brother   . Stomach cancer Son   . Heart attack Neg Hx   . Stroke Neg Hx     Past Surgical History:  Procedure Laterality Date  . ANTERIOR AND POSTERIOR VAGINAL REPAIR  01/2002   Archie Endo 01/23/2011  . APPENDECTOMY  1948  . CARDIAC CATHETERIZATION  06/26/2017  . CORONARY ANGIOPLASTY WITH STENT PLACEMENT  07/01/2017  . CORONARY ATHERECTOMY N/A 07/01/2017   Procedure:  CORONARY ATHERECTOMY;  Surgeon: Belva Crome, MD;  Location: [facility];  Service: Cardiovascular;  Laterality: N/A;  . CORONARY STENT INTERVENTION N/A 07/01/2017   Procedure: CORONARY STENT INTERVENTION;  Surgeon: Belva Crome, MD;  Location: [facility];  Service: Cardiovascular;  Laterality: N/A;  . HAMMER TOE SURGERY    . HEMORRHOID BANDING    . HIP SURGERY Left 04/2009   hip examination under anesthesia followed by greater trochanteric bursectomy; iliotibial band tenotomy/notes 01/20/2011  . I&D EXTREMITY Right 01/10/2018   Procedure: IRRIGATION AND DEBRIDEMENT RIGHT KNEE, APPLY WOUND VAC;  Surgeon: Newt Minion, MD;  Location: [facility];  Service: Orthopedics;  Laterality: Right;  . KNEE BURSECTOMY Right 04/2009   Archie Endo 01/09/2011  . LEFT HEART CATH AND CORONARY ANGIOGRAPHY N/A 06/26/2017   Procedure: LEFT HEART CATH AND CORONARY ANGIOGRAPHY;  Surgeon: Belva Crome, MD;  Location: [facility];  Service: Cardiovascular;  Laterality: N/A;  . PUBOVAGINAL SLING  01/2002   Archie Endo 01/23/2011  . REDUCTION MAMMAPLASTY    . TEMPORARY PACEMAKER N/A 07/01/2017   Procedure: TEMPORARY PACEMAKER;  Surgeon: Belva Crome, MD;  Location: [facility];  Service: Cardiovascular;  Laterality: N/A;  . VAGINAL HYSTERECTOMY  01/2002   Vaginal hysterectomy, bilateral salpingo-oophorectomy/notes 01/23/2011   Social History   Occupational History  . Occupation: Patent attorney: RETIRED  Tobacco Use  . Smoking status: Former Smoker    Packs/day: 0.25    Years: 28.00    Pack years: 7.00    Types: Cigarettes    Quit date: 1981    Years since quitting: 39.7  . Smokeless tobacco: Never Used  Substance and Sexual Activity  . Alcohol use: Yes    Alcohol/week: 14.0 standard drinks    Types: 14 Shots of liquor per week    Comment: 07/01/2017 "couple shots/day"  . Drug use: No  . Sexual activity: Yes    Birth control/protection: Surgical    Comment: 1st intercourse  84 yo--Fewer than 5 partners

## 2019-05-19 NOTE — Telephone Encounter (Signed)
Patient called stating that she has a wound on her lower right leg that really needs to be seen.  Stated that she has been treating it and keeping it wrapped, but when changing the dressing, her leg is bleeding and stated that she is on a blood thinner.  Advised her that he is not in the office after today and that he has an add on surgery for today.   Stated that it could not wait.  Cb# is [phone].  Please advise.  Thank you.

## 2019-05-20 ENCOUNTER — Telehealth: Payer: Self-pay | Admitting: Orthopedic Surgery

## 2019-05-20 NOTE — Telephone Encounter (Signed)
Patient called asked if she can get a call back concerning the Rx she got yesterday for the compression stockings. Patient also said [facility] discount medical supply also called concerning the stockings. The number to contact patient is [phone]

## 2019-05-21 ENCOUNTER — Telehealth: Payer: Self-pay | Admitting: Orthopedic Surgery

## 2019-05-21 NOTE — Telephone Encounter (Signed)
Patient was called, patient instructions was not clear to her. Will hold message until Monday and give a callback.

## 2019-05-21 NOTE — Telephone Encounter (Signed)
Patient called. She would like to know how to put on the compression stocking. Her call back number is [phone]. Thanks

## 2019-05-22 NOTE — Telephone Encounter (Signed)
Noted - will call

## 2019-05-26 NOTE — Telephone Encounter (Signed)
Patient was called and stated that she is doing better now. She states a scab over wound now and said she will continue to wear the socks recommended by Dr Sharol Given.

## 2019-05-29 ENCOUNTER — Telehealth: Payer: Self-pay | Admitting: Orthopedic Surgery

## 2019-05-29 NOTE — Telephone Encounter (Signed)
Patient called. Asked if Star could call her. She has questions about her wound. Her call back number is [phone]

## 2019-05-29 NOTE — Telephone Encounter (Signed)
Patient appt was made for next Wed for her wound concerns. Patient states her sock is tearing her skin.

## 2019-06-03 ENCOUNTER — Ambulatory Visit: Payer: Medicare Other | Admitting: Family

## 2019-06-04 ENCOUNTER — Encounter: Payer: Self-pay | Admitting: Orthopedic Surgery

## 2019-06-04 ENCOUNTER — Other Ambulatory Visit: Payer: Self-pay

## 2019-06-04 ENCOUNTER — Ambulatory Visit (INDEPENDENT_AMBULATORY_CARE_PROVIDER_SITE_OTHER): Payer: Medicare Other | Admitting: Orthopedic Surgery

## 2019-06-04 VITALS — Ht 64.0 in | Wt 160.0 lb

## 2019-06-04 DIAGNOSIS — I87331 Chronic venous hypertension (idiopathic) with ulcer and inflammation of right lower extremity: Secondary | ICD-10-CM

## 2019-06-04 DIAGNOSIS — L97919 Non-pressure chronic ulcer of unspecified part of right lower leg with unspecified severity: Secondary | ICD-10-CM | POA: Diagnosis not present

## 2019-06-06 ENCOUNTER — Encounter: Payer: Self-pay | Admitting: Orthopedic Surgery

## 2019-06-06 NOTE — Progress Notes (Signed)
Office Visit Note   Patient: Hailey [name]           Date of Birth: April 18, 1935           MRN: [id number] Visit Date: 06/04/2019              Requested by: Cassandria Anger, MD [place],  [place] [address] PCP: Cassandria Anger, MD  Chief Complaint  Patient presents with   Right Leg - Follow-up      HPI: Patient is an 84 year old woman with venous insufficiency with healing venous ulcerations right lower extremity.  Patient denies any drainage she presents in follow-up.  Assessment & Plan: Visit Diagnoses:  1. Chronic venous hypertension (idiopathic) with ulcer and inflammation of right lower extremity (HCC)     Plan: Discussed that she could use the sock or the sleeve to continue with compression for the venous insufficiency.  Discussed that she is at risk for recurrent ulcers with blunt trauma.  Follow-Up Instructions: Return in about 4 weeks (around 07/02/2019).   Ortho Exam  Patient is alert, oriented, no adenopathy, well-dressed, normal affect, normal respiratory effort. Examination patient has brawny skin color changes in both lower extremities.  There is a healing venous ulcer on the right leg with a thin scab.  No cellulitis no drainage no odor no signs of infection.  Imaging: No results found. No images are attached to the encounter.  Labs: Lab Results  Component Value Date   HGBA1C 5.6 07/04/2014   ESRSEDRATE 31 (H) 08/30/2014   ESRSEDRATE 29 (H) 12/18/2010   ESRSEDRATE 39 (H) 06/14/2009   CRP 0.7 08/30/2014   LABURIC 6.1 06/14/2009   LABORGA ESCHERICHIA COLI 02/19/2017     Lab Results  Component Value Date   ALBUMIN 4.2 04/16/2019   ALBUMIN 4.0 05/22/2018   ALBUMIN 3.4 (L) 12/24/2017   LABURIC 6.1 06/14/2009    No results found for: MG Lab Results  Component Value Date   VD25OH 32 12/12/2016    No results found for: PREALBUMIN CBC EXTENDED Latest Ref Rng & Units 04/16/2019 05/22/2018 02/21/2018  WBC 4.0 - 10.5 K/uL 6.8  6.5 5.3  RBC 3.87 - 5.11 Mil/uL 3.55(L) 3.90 3.48(L)  HGB 12.0 - 15.0 g/dL 11.6(L) 11.5(L) 10.4(L)  HCT 36.0 - 46.0 % 34.4(L) 34.8(L) 31.7(L)  PLT 150.0 - 400.0 K/uL 203.0 239.0 247.0  NEUTROABS 1.4 - 7.7 K/uL 4.1 4.2 3.1  LYMPHSABS 0.7 - 4.0 K/uL 1.6 1.0 1.1     Body mass index is 27.46 kg/m.  Orders:  No orders of the defined types were placed in this encounter.  No orders of the defined types were placed in this encounter.    Procedures: No procedures performed  Clinical Data: No additional findings.  ROS:  All other systems negative, except as noted in the HPI. Review of Systems  Objective: Vital Signs: Ht 5\' 4"  (1.626 m)    Wt 160 lb (72.6 kg)    BMI 27.46 kg/m   Specialty Comments:  No specialty comments available.  PMFS History: Patient Active Problem List   Diagnosis Date Noted   Viral URI with cough 08/15/2018   Abdominal pain 05/22/2018   Traumatic hematoma of right knee 01/10/2018   Ulcer of right knee (HCC)    Bradycardia with 41-50 beats per minute 12/27/2017   Right bundle branch block (RBBB) on electrocardiogram (ECG) 12/27/2017   Quadriceps tendon rupture 12/24/2017   CKD (chronic kidney disease), stage II 12/24/2017  Cellulitis of leg, right with large prepatella hematoma and open wounds 12/24/2017   Iliotibial band syndrome of right side 12/16/2017   Depression 07/11/2017   Intervertebral lumbar disc disorder with myelopathy, lumbar region 04/25/2017   Gastroenteritis 09/13/2016   Chronic anticoagulation 05/24/2015   Coronary artery disease involving native coronary artery of native heart with angina pectoris ([place]) 12/20/2014   Iliotibial band syndrome of left side 11/16/2014   Diverticulitis of colon 08/30/2014   PAF (paroxysmal atrial fibrillation) ([place]) 11/21/2012   Greater trochanteric bursitis of both hips 05/21/2012   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[facility]) 01/31/2011   Osteoarthritis of left hip 07/03/2010   DEGENERATIVE  DISC DISEASE, LUMBOSACRAL SPINE 05/18/2010   SYNCOPE 10/27/2008   Essential hypertension 06/16/2007   GERD 06/16/2007   COLONIC POLYPS, HX OF 06/16/2007   Past Medical History:  Diagnosis Date   Acute blood loss anemia 12/25/2017   Allergic rhinitis    Anxiety    Arthritis    "my whole spine" (07/01/2017)   Atrial fibrillation (HCC)    Bowel obstruction (HCC)    in [place]   Bradycardia with 41-50 beats per minute 12/27/2017   Cellulitis of leg, right with large prepatella hematoma and open wounds 12/26/2017   CKD (chronic kidney disease) stage 2, GFR 60-89 ml/min    Colon polyps    Coronary artery disease    10/18 PCI/DES to p/m LCx with cutting balloon to mLcx   Diverticulosis of colon    GERD (gastroesophageal reflux disease)    Hip bursitis 2010   Dr Para March, Post op seroma   History of colon polyps    HTN (hypertension)    IBS (irritable bowel syndrome)    constipation predominant - Dr Earlean Shawl   Lichen sclerosus    Osteopenia 11/2016   T score -2.0 FRAX 15%/4.3%   PAC (premature atrial contraction)    Symptomatiic   Right bundle branch block (RBBB) on electrocardiogram (ECG) 12/27/2017   Scoliosis    SVT (supraventricular tachycardia) (HCC)    brief history    Family History  Problem Relation Age of Onset   Colon cancer Mother    Diabetes Father    Prostate cancer Father    Prostate cancer Brother    Pancreatic cancer Brother    Stomach cancer Son    Heart attack Neg Hx    Stroke Neg Hx     Past Surgical History:  Procedure Laterality Date   ANTERIOR AND POSTERIOR VAGINAL REPAIR  01/2002   Archie Endo 01/23/2011   APPENDECTOMY  1948   CARDIAC CATHETERIZATION  06/26/2017   CORONARY ANGIOPLASTY WITH STENT PLACEMENT  07/01/2017   CORONARY ATHERECTOMY N/A 07/01/2017   Procedure: CORONARY ATHERECTOMY;  Surgeon: Belva Crome, MD;  Location: [facility];  Service: Cardiovascular;  Laterality: N/A;   CORONARY STENT  INTERVENTION N/A 07/01/2017   Procedure: CORONARY STENT INTERVENTION;  Surgeon: Belva Crome, MD;  Location: [facility];  Service: Cardiovascular;  Laterality: N/A;   HAMMER TOE SURGERY     HEMORRHOID BANDING     HIP SURGERY Left 04/2009   hip examination under anesthesia followed by greater trochanteric bursectomy; iliotibial band tenotomy/notes 01/20/2011   I&D EXTREMITY Right 01/10/2018   Procedure: IRRIGATION AND DEBRIDEMENT RIGHT KNEE, APPLY WOUND VAC;  Surgeon: Newt Minion, MD;  Location: [facility];  Service: Orthopedics;  Laterality: Right;   KNEE BURSECTOMY Right 04/2009   Archie Endo 01/09/2011   LEFT HEART CATH AND CORONARY ANGIOGRAPHY N/A 06/26/2017  Procedure: LEFT HEART CATH AND CORONARY ANGIOGRAPHY;  Surgeon: Belva Crome, MD;  Location: [facility];  Service: Cardiovascular;  Laterality: N/A;   PUBOVAGINAL SLING  01/2002   Archie Endo 01/23/2011   REDUCTION MAMMAPLASTY     TEMPORARY PACEMAKER N/A 07/01/2017   Procedure: TEMPORARY PACEMAKER;  Surgeon: Belva Crome, MD;  Location: [facility];  Service: Cardiovascular;  Laterality: N/A;   VAGINAL HYSTERECTOMY  01/2002   Vaginal hysterectomy, bilateral salpingo-oophorectomy/notes 01/23/2011   Social History   Occupational History   Occupation: Patent attorney: RETIRED  Tobacco Use   Smoking status: Former Smoker    Packs/day: 0.25    Years: 28.00    Pack years: 7.00    Types: Cigarettes    Quit date: 1981    Years since quitting: 39.7   Smokeless tobacco: Never Used  Substance and Sexual Activity   Alcohol use: Yes    Alcohol/week: 14.0 standard drinks    Types: 14 Shots of liquor per week    Comment: 07/01/2017 "couple shots/day"   Drug use: No   Sexual activity: Yes    Birth control/protection: Surgical    Comment: 1st intercourse 84 yo--Fewer than 5 partners

## 2019-06-16 ENCOUNTER — Ambulatory Visit: Payer: Medicare Other | Admitting: Orthopedic Surgery

## 2019-06-18 ENCOUNTER — Encounter: Payer: Self-pay | Admitting: Gynecology

## 2019-06-25 ENCOUNTER — Other Ambulatory Visit: Payer: Self-pay | Admitting: Internal Medicine

## 2019-06-26 ENCOUNTER — Other Ambulatory Visit: Payer: Self-pay | Admitting: Gynecology

## 2019-06-30 ENCOUNTER — Other Ambulatory Visit: Payer: Self-pay

## 2019-06-30 NOTE — Patient Outreach (Signed)
[facility]) Care Management  06/30/2019  HARSHIKA GROSSHANS 1934/11/14 [id number]   Medication Adherence call to Mrs. Wilkinson Compliant Voice message left with a call back number. Mrs. Standridge is showing past due on Atorvastatin 80 mg under [place].   [facility] Management Direct Dial [phone]  Fax [phone] [email or web address]

## 2019-07-12 DIAGNOSIS — A609 Anogenital herpesviral infection, unspecified: Secondary | ICD-10-CM

## 2019-07-12 HISTORY — DX: Anogenital herpesviral infection, unspecified: A60.9

## 2019-07-23 ENCOUNTER — Other Ambulatory Visit: Payer: Self-pay

## 2019-07-23 ENCOUNTER — Encounter: Payer: Self-pay | Admitting: Gynecology

## 2019-07-23 ENCOUNTER — Ambulatory Visit: Payer: Medicare Other | Admitting: Gynecology

## 2019-07-23 VITALS — BP 122/78

## 2019-07-23 DIAGNOSIS — N9089 Other specified noninflammatory disorders of vulva and perineum: Secondary | ICD-10-CM

## 2019-07-23 DIAGNOSIS — N898 Other specified noninflammatory disorders of vagina: Secondary | ICD-10-CM | POA: Diagnosis not present

## 2019-07-23 DIAGNOSIS — R300 Dysuria: Secondary | ICD-10-CM

## 2019-07-23 LAB — WET PREP FOR TRICH, YEAST, CLUE

## 2019-07-23 MED ORDER — TERCONAZOLE 0.4 % VA CREA: 1.0000 | TOPICAL_CREAM | Freq: Every day | VAGINAL | 0 refills | Status: DC

## 2019-07-23 NOTE — Progress Notes (Signed)
    Hailey [name] 02/20/1935 [id number]        84 y.o.  G2P2001 presents complaining of vulvar irritation and swelling particularly on the right.  Also some discomfort with urination.  No frequency urgency low back pain fever or chills.  Started on estradiol vaginal cream in May and notes that she has had a lot less vaginal dryness and irritation feeling better on it.  Also has used clobetasol for her lichen sclerosus but this is not helping with the irritation.  Past medical history,surgical history, problem list, medications, allergies, family history and social history were all reviewed and documented in the EPIC chart.  Directed ROS with pertinent positives and negatives documented in the history of present illness/assessment and plan.  Exam: [name] assistant Vitals:   07/23/19 1114  BP: 122/78   General appearance:  Normal Abdomen soft nontender without mass guarding rebound Pelvic external BUS vagina with atrophic changes.  Area of excoriation right upper labia majora.  HSV PCR done.  Vagina with atrophic changes.  Bimanual without masses or tenderness.  Blanching of the perineal body/posterior fourchette consistent with her history of lichen sclerosis.  Assessment/Plan:  84 y.o. G2P2001 with history and exam as above.  Urine analysis is unremarkable.  Wet prep is negative.  We discussed the area of excoriation.  I did an HSV screen over this area although not classic for HSV.  I discussed this with her.  Recommended for now to cover for yeast with Terazol 7 day cream.  We will follow-up on culture given her history of UTIs recently.  She will continue on her estradiol vaginal cream twice weekly.  We will follow-up if the irritation continues.    Anastasio Auerbach MD, 11:36 AM 07/23/2019

## 2019-07-23 NOTE — Patient Instructions (Signed)
Office will call you if the urine culture shows any bacteria.  We will also follow-up on the viral testing that I did.

## 2019-07-24 LAB — SURESWAB HSV, TYPE 1/2 DNA, PCR
HSV 1 DNA: DETECTED — AB
HSV 2 DNA: NOT DETECTED

## 2019-07-25 LAB — URINALYSIS, COMPLETE W/RFL CULTURE
Bilirubin Urine: NEGATIVE
Glucose, UA: NEGATIVE
Hyaline Cast: NONE SEEN /LPF
Ketones, ur: NEGATIVE
Leukocyte Esterase: NEGATIVE
Nitrites, Initial: NEGATIVE
Protein, ur: NEGATIVE
Specific Gravity, Urine: 1.025 (ref 1.001–1.03)
WBC, UA: NONE SEEN /HPF (ref 0–5)
pH: 6 (ref 5.0–8.0)

## 2019-07-25 LAB — URINE CULTURE
MICRO NUMBER:: 1093861
SPECIMEN QUALITY:: ADEQUATE

## 2019-07-25 LAB — CULTURE INDICATED

## 2019-07-27 ENCOUNTER — Other Ambulatory Visit: Payer: Self-pay | Admitting: *Deleted

## 2019-07-27 ENCOUNTER — Encounter: Payer: Self-pay | Admitting: Gynecology

## 2019-07-27 MED ORDER — VALACYCLOVIR HCL 500 MG PO TABS: ORAL_TABLET | ORAL | 0 refills | Status: DC

## 2019-07-27 NOTE — Telephone Encounter (Signed)
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  In retrospect she feels that she has had recurrences in the past that was written off to be yeast or other irritations.  She will use the Valtrex 500 mg twice daily x5 days at earliest sign of any recurrences in the future.  She will call me back if she has any other questions.

## 2019-07-27 NOTE — Telephone Encounter (Signed)
Dr. Loetta Rough-  Would you like me to schedule a tele visit for her?

## 2019-07-30 ENCOUNTER — Other Ambulatory Visit: Payer: Self-pay

## 2019-07-30 ENCOUNTER — Encounter: Payer: Self-pay | Admitting: Sports Medicine

## 2019-07-30 ENCOUNTER — Ambulatory Visit: Payer: Medicare Other | Admitting: Sports Medicine

## 2019-07-30 DIAGNOSIS — M25551 Pain in right hip: Secondary | ICD-10-CM | POA: Diagnosis not present

## 2019-07-30 MED ORDER — METHYLPREDNISOLONE ACETATE 80 MG/ML IJ SUSP
80.0000 mg | Freq: Once | INTRAMUSCULAR | Status: AC
  Administered 2019-07-30: 80 mg via INTRA_ARTICULAR

## 2019-07-30 NOTE — Progress Notes (Signed)
Hailey [name] - 84 y.o. female MRN [id number]  Date of birth: May 09, 1935  SUBJECTIVE:   CC: Right leg/knee pain  HPI: Hailey [name] is an 84 year old female with known lumbosacral degenerative disc disease, osteoarthritis, and previous right quadriceps tendon rupture with chronic lower back and bilateral hip pain presenting for evaluation of acute right leg/knee pain.  She has noticed an increase of right lateral hip pain over the last several weeks to months that is throbbing in nature.  States the throbbing pain sensation will radiate down her thigh and sometimes go into her lateral shin.  Denies any recent eliciting injury or trauma to this area.  Feels weak when her hip is hurting like her leg might buckle, had difficulty getting in and out of her car yesterday due to the pain.  No worse with bending forward or backwards.  No falls.  Denies any popping, clicking, locking, or giving out of her knee on that side.  Denies any numbness, tingling, new low back pain.  Cannot sleep on that side due to making the pain worse.  She is recently tried CBD cream on her back and hips which has been quite helpful to her.  Also uses Tylenol on a regular basis with some relief.  ROS: No unexpected weight loss, fever, chills, swelling, numbness/tingling, redness, otherwise see HPI   PMHx, surgical, family, and social histories reviewed.  Surgical history notable for irrigation and debridement of right knee in 2019 with skin grafting.  Medications additionally reviewed, she is on chronic Eliquis.  PHYSICAL EXAM:  VS: BP:120/80  HR: bpm  TEMP: ( )  RESP:   HT:5\' 4"  (162.6 cm)   WT:160 lb (72.6 kg)  BMI:27.45 PHYSICAL EXAM: Gen: NAD, alert, cooperative with exam, well-appearing Resp: non-labored Skin: no rashes, normal turgor  Neuro: no gross deficits.  Psych:  alert and oriented  Right hip:  - Inspection: No gross deformity, no swelling, erythema, or ecchymosis - Palpation: Pinpoint tenderness overlying  greater trochanter with reproducing pain.  - ROM: Normal range of motion on Flexion, extension, abduction, internal and external rotation - Strength: Weakened 4/5 strength throughout all hip and pelvic musculature, most notably with hip abduction. - Neuro/vasc: NV intact distally - Special Tests: Negative FABER and FADIR.  Straight leg raise negative bilaterally.  Nontender to palpation of lumbar spinous processes and SI joints.  Right Knee: - Inspection: no gross deformity. No swelling/effusion, erythema. Skin intact - Palpation: no TTP with the exception of mild tenderness along medial joint line - ROM: full active ROM with flexion and extension in knee and hip - Strength: 4/5 strength with knee flexion/extension - Neuro/vasc: NV intact - Special Tests: - LIGAMENTS: negative anterior and posterior drawer, negative Lachman's, no MCL or LCL laxity  -- MENISCUS: negative McMurray's, negative Thessaly   -- PF JOINT: negative patellar grind  Trochanteric bursa injection: After informed written consent timeout was performed, patient was seated on exam table.  Lateral thigh overlying the greater trochanter was prepped with alcohol swab and utilizing lateral approach, patient's greater trochanteric bursa was injected with 6:1 lidocaine: depomedrol. Patient tolerated the procedure well without immediate complications.  ASSESSMENT & PLAN:   Greater trochanteric pain syndrome of right lower extremity Quite notable tenderness over her greater trochanter on the right with reproducing exact pain, consistent with greater trochanteric pain syndrome. Suspect this is the etiology of her current acute pain, however she additionally has weakened pelvic and hip musculature bilaterally that likely further exacerbates her chronic bilateral hip  pain and stresses her knee joints inappropriately.  Performed greater trochanteric bursa injection today for acute relief, patient tolerated well.  In addition, will send her  to physical therapy for pelvic/hip strengthening exercises that she can continue to do at home on a daily basis, believe she will need to continue with this for long-term improvements.  May continue to use CBD cream and/or Tylenol.  [name], DO  Family Medicine PGY-2   Patient seen and evaluated with the resident.  I agree with the above plan of care.  Patient tolerated the cortisone injection without difficulty.  She will start physical therapy and wean to a home exercise program per the therapist discretion.  I did explain to the patient that I could repeat an injection in a few weeks if needed.  Follow-up for ongoing or recalcitrant issues.

## 2019-07-30 NOTE — Assessment & Plan Note (Addendum)
Quite notable tenderness over her greater trochanter on the right with reproducing exact pain, consistent with greater trochanteric pain syndrome. Suspect this is the etiology of her current acute pain, however she additionally has weakened pelvic and hip musculature bilaterally that likely further exacerbates her chronic bilateral hip pain and stresses her knee joints inappropriately.  Performed greater trochanteric bursa injection today for acute relief, patient tolerated well.  In addition, will send her to physical therapy for pelvic/hip strengthening exercises that she can continue to do at home on a daily basis, believe she will need to continue with this for long-term improvements.  May continue to use CBD cream and/or Tylenol.

## 2019-07-31 ENCOUNTER — Encounter: Payer: Self-pay | Admitting: Sports Medicine

## 2019-08-13 ENCOUNTER — Encounter: Payer: Self-pay | Admitting: Gynecology

## 2019-08-14 DIAGNOSIS — M7601 Gluteal tendinitis, right hip: Secondary | ICD-10-CM | POA: Diagnosis not present

## 2019-08-14 DIAGNOSIS — M25551 Pain in right hip: Secondary | ICD-10-CM | POA: Diagnosis not present

## 2019-08-14 DIAGNOSIS — M545 Low back pain: Secondary | ICD-10-CM | POA: Diagnosis not present

## 2019-08-16 NOTE — Progress Notes (Signed)
08/16/2019 LAVERNA FANOUS [id number] [birth year]   History of Present Illness: Hailey [name] is an 84 year old female with a past medical history of anxiety,  CAD with a single DES, atrial fibrillation on Eliquis, CKD stage II, GERD, IBS, bowel obstructions, diverticulitis and colon polyps. She presents today with complaints of increasing episodes of diarrhea.  She reported having loose stools during the day last week and 2 days later she awakened in the middle the night and passed a loose stool.  No bloody diarrhea.  She has intermittent right and left lower abdominal pain which is her primary concern today.  She takes hyoscyamine as needed which decreases her pain at times.  She has occasional nausea without vomiting.  She denies having any fever, sweats or chills.  No weight loss.  She is concerned regarding her risk of cancer as her mother was diagnosed with colon cancer in her 16s, her brother died from pancreatic cancer and her son had stomach cancer.  She reports her stress level is elevated because she is worried about her abdominal pain.  She has a history of colon polyps. A colonoscopy in 1993  Identified 75mm polyps ascending colon, a 52mm polyp transverse colon and a 1 to 2 mm polps in the rectal vault, 1998 a 2-69mm polyp in the ascending colon, 2003 a 61mm hyperplastic rectal polyp was removed.  Her most recent colonoscopy was 01/17/2012 by Dr. Earlean Shawl, no polyps were identified, however, the prep was poor but after irrigation excellent visual visualization was obtained and the scope was advanced with some difficulty due to a tortuous left colon.  She has previously utilized a fiber supplement which caused constipation.  IBgard was not tolerated.  She avoids most dairy products.  She no longer eats ice cream or yogurt.  She drinks lactose-free milk a few days weekly.  She occasionally eats cheese at night with a glass of wine.  No recent antibiotics.  She fell at home approximately 2 weeks ago  while at home as she was putting an item back on the shelf which resulted in losing her balance and falling.  She denied having any significant injuries, she has a small abrasion to her left leg.  She reports having intermittent balance issues in the past.  She cannot recall if she used Hyoscyamine during the time of her falls.  Colonoscopy 01/17/2012 by Dr. Earlean Shawl: Prep was poor but easily cleared with copious irrigation with excellent visualization  ultimately obtained. The scope was passed with some difficulty through a tortuous laden left colon but the scope was eventually passed with intubation to the cecum. 1) No colorectal neoplasia. 2) Diverticulosis - left colon, extensive. 3) Internal hemorrhoids - large -Repeat colonoscopy in 5 years because of family history of colon cancer. -Hemorrhoid banding.   Current Outpatient Medications on File Prior to Visit  Medication Sig Dispense Refill   acetaminophen (TYLENOL) 500 MG tablet Take 1,000 mg by mouth every 6 (six) hours as needed (Coated or capsules).     apixaban (ELIQUIS) 2.5 MG TABS tablet Take 1 tablet (2.5 mg total) by mouth 2 (two) times daily. 90 tablet 3   atorvastatin (LIPITOR) 80 MG tablet TAKE 1 TABLET AT 6PM. 90 tablet 3   cetirizine (ZYRTEC) 10 MG tablet Take 10 mg by mouth daily.     clobetasol cream (TEMOVATE) 0.05 % APPLY NIGHTLY AS DIRECTED. 30 g 1   DULoxetine (CYMBALTA) 60 MG capsule TAKE 1 CAPSULE DAILY. 90 capsule 0  esomeprazole (NEXIUM) 40 MG capsule Take 1 capsule (40 mg total) by mouth daily. 90 capsule 3   LORazepam (ATIVAN) 1 MG tablet Take 1 tablet (1 mg total) by mouth 2 (two) times daily as needed for anxiety or sleep. 180 tablet 1   metoprolol succinate (TOPROL-XL) 25 MG 24 hr tablet TAKE 3 TABLETS DAILY WITH OR IMMEDIATELY FOLLOWING A MEAL. 270 tablet 3   nitroGLYCERIN (NITROSTAT) 0.4 MG SL tablet Place 0.4 mg under the tongue every 5 (five) minutes as needed for chest pain (Call 911 at 3rd dose  within 15 minutes.).     NONFORMULARY OR COMPOUNDED ITEM Estradiol 0.02% vag cream prefilled applicators  S: insert appful (1 gram) intravaginally twice weekly. 24 each 3   Probiotic Product (ALIGN PO) Take 1 capsule by mouth daily.     sodium chloride (OCEAN) 0.65 % SOLN nasal spray Place 1 spray into both nostrils as needed for congestion.     terconazole (TERAZOL 7) 0.4 % vaginal cream Place 1 applicator vaginally at bedtime. 45 g 0   valACYclovir (VALTREX) 500 MG tablet Take one tablet by mouth twice daily for 5 days for early onset symptoms. 30 tablet 0   hyoscyamine (LEVSIN) 0.125 MG tablet Take 1-2 tablets (0.125-0.25 mg total) by mouth every 4 (four) hours as needed for up to 10 days for cramping. 100 tablet 3   No current facility-administered medications on file prior to visit.    Allergies  Allergen Reactions   Macrobid [Nitrofurantoin Monohyd Macro] Other (See Comments)    Nausea, stomach cramps, fatigue , headache.   Meloxicam Other (See Comments)    Jittery and headache   Digoxin And Related     headaches   Mobic [Meloxicam]    Sulfamethoxazole-Trimethoprim Nausea Only     Current Medications, Allergies, Past Medical History, Past Surgical History, Family History and Social History were reviewed in [employer] record.   Physical Exam: BP 114/80    Pulse 86    Temp 97.9 F (36.6 C)    Ht 5\' 4"  (1.626 m)    Wt 156 lb (70.8 kg)    BMI 26.78 kg/m  General: Well developed  84 year old female in no acute distress Head: Normocephalic and atraumatic Eyes:  Sclerae anicteric, conjunctiva pink  Ears: Normal auditory acuity Lungs: Clear throughout to auscultation Heart: Irregular rhythm, no murmurs Abdomen: Soft, mild tenderness RLQ and LLQ without rebound or guarding.  Non distended. No masses, no hepatomegaly. Normal bowel sounds Rectal: Deferred Musculoskeletal: Symmetrical with no gross deformities  Extremities: No edema  Neurological:  Alert oriented x 4, grossly nonfocal Psychological:  Alert and cooperative. Normal mood and affect  Assessment and Recommendations:  55. 84 year old female with lower abdominal pain, diverticular disease -abdominal/pelvic CT with oral contrast only (Cr. 1.24) -CBC, CMP and CRP -Tylenol 500mg  2 tabs po bid PRN for abdominal pian -Patient to use Hyoscyamine sparingly, if she develops dizziness, balance issues or falls after using this medication it should be discontinued as discussed with the patient today in office -Patient to call our office if her abdominal pain worsens  2. IBS-D -See plan in #1 -Florastor probiotic 1 p.o. twice dail and Phillips bacteria probiotic 1 p.o. daily -Stool cultures if diarrhea occurs daily  3. History of colon polyps.  Family history of colon cancer.  Last colonoscopy in 2013 no polyps, tortuous sigmoid colon -surveillance colonoscopy deferred for now as patient is at higher risk for bowel perforation due to  age and hx of diverticular disease with a tortuous colon + increased risk of sedation complications and bleeding due to CAD, afib on Eliquis. However, a diagnostic colonoscopy to be considered if CTAP shows any evidence of a colon mass. -see plan in # 1  4. GERD, stable   5. Atrial fibrillation on Eliquis. CAD.  6. CKD

## 2019-08-17 ENCOUNTER — Encounter: Payer: Self-pay | Admitting: Nurse Practitioner

## 2019-08-17 ENCOUNTER — Other Ambulatory Visit (INDEPENDENT_AMBULATORY_CARE_PROVIDER_SITE_OTHER): Payer: Medicare Other

## 2019-08-17 ENCOUNTER — Ambulatory Visit: Payer: Medicare Other | Admitting: Nurse Practitioner

## 2019-08-17 VITALS — BP 114/80 | HR 86 | Temp 97.9°F | Ht 64.0 in | Wt 156.0 lb

## 2019-08-17 DIAGNOSIS — K5731 Diverticulosis of large intestine without perforation or abscess with bleeding: Secondary | ICD-10-CM

## 2019-08-17 DIAGNOSIS — R1032 Left lower quadrant pain: Secondary | ICD-10-CM

## 2019-08-17 LAB — CBC WITH DIFFERENTIAL/PLATELET
Basophils Absolute: 0.1 10*3/uL (ref 0.0–0.1)
Basophils Relative: 0.8 % (ref 0.0–3.0)
Eosinophils Absolute: 0.2 10*3/uL (ref 0.0–0.7)
Eosinophils Relative: 2.3 % (ref 0.0–5.0)
HCT: 36.9 % (ref 36.0–46.0)
Hemoglobin: 12.2 g/dL (ref 12.0–15.0)
Lymphocytes Relative: 17.5 % (ref 12.0–46.0)
Lymphs Abs: 1.4 10*3/uL (ref 0.7–4.0)
MCHC: 33.1 g/dL (ref 30.0–36.0)
MCV: 97.6 fl (ref 78.0–100.0)
Monocytes Absolute: 0.7 10*3/uL (ref 0.1–1.0)
Monocytes Relative: 8.9 % (ref 3.0–12.0)
Neutro Abs: 5.6 10*3/uL (ref 1.4–7.7)
Neutrophils Relative %: 70.5 % (ref 43.0–77.0)
Platelets: 220 10*3/uL (ref 150.0–400.0)
RBC: 3.78 Mil/uL — ABNORMAL LOW (ref 3.87–5.11)
RDW: 14.8 % (ref 11.5–15.5)
WBC: 8 10*3/uL (ref 4.0–10.5)

## 2019-08-17 LAB — C-REACTIVE PROTEIN: CRP: <1 mg/dL (ref 0.5–20.0)

## 2019-08-17 NOTE — Patient Instructions (Addendum)
If you are age 82 or older, your body mass index should be between 23-30. Your Body mass index is 26.78 kg/m. If this is out of the aforementioned range listed, please consider follow up with your Primary Care Provider.  If you are age 10 or younger, your body mass index should be between 19-25. Your Body mass index is 26.78 kg/m. If this is out of the aformentioned range listed, please consider follow up with your Primary Care Provider.   1. Please use Florastor probiotics one tablet by mouth twice daily for four weeks  2. Use Phillips bacteria probiotic one tablet by mouth daily for four weeks.  3. Tylenol 500 mg 2 tablets twice daily for as needed for pain.  You have been scheduled for a CT scan of the abdomen and pelvis at [facility]. Please go to radiology on the 1st floor.. You are scheduled on 08/20/2019 at 12:30 pm. You should arrive 15 minutes prior to your appointment time for registration. Please follow the written instructions below on the day of your exam:  WARNING: IF YOU ARE ALLERGIC TO IODINE/X-RAY DYE, PLEASE NOTIFY RADIOLOGY IMMEDIATELY AT [phone]! YOU WILL BE GIVEN A 13 HOUR PREMEDICATION PREP.  1) Do not eat or drink anything after 8:30 am (4 hours prior to your test) 2) You have been given 2 bottles of oral contrast to drink. The solution may taste better if refrigerated, but do NOT add ice or any other liquid to this solution. Shake well before drinking.    Drink 1 bottle of contrast @ 10:30 am (2 hours prior to your exam)  Drink 1 bottle of contrast @ 11:30 am (1 hour prior to your exam)  You may take any medications as prescribed with a small amount of water, if necessary. If you take any of the following medications: METFORMIN, GLUCOPHAGE, GLUCOVANCE, AVANDAMET, RIOMET, FORTAMET, [place] MET, JANUMET, GLUMETZA or METAGLIP, you MAY be asked to HOLD this medication 48 hours AFTER the exam.  The purpose of you drinking the oral contrast is to aid in the  visualization of your intestinal tract. The contrast solution may cause some diarrhea. Depending on your individual set of symptoms, you may also receive an intravenous injection of x-ray contrast/dye. [facility] for 30 minutes or longer, depending on the type of exam you are having performed.  This test typically takes 30-45 minutes to complete.  If you have any questions regarding your exam or if you need to reschedule, you may call the CT department at [phone] between the hours of 8:00 am and 5:00 pm, Monday-Friday.  ________________________________________________________________________ Thank you for choosing me and [facility]

## 2019-08-17 NOTE — Telephone Encounter (Signed)
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  Notes some minor vulvar irritation that lasts 1 day and then resolves.  Recommended she monitor and if the irritation persists more than 1 day to go ahead and start Valtrex twice daily for several days.

## 2019-08-18 NOTE — Progress Notes (Signed)
Assessment and plan reviewed 

## 2019-08-19 ENCOUNTER — Other Ambulatory Visit: Payer: Self-pay

## 2019-08-19 DIAGNOSIS — R109 Unspecified abdominal pain: Secondary | ICD-10-CM

## 2019-08-20 ENCOUNTER — Other Ambulatory Visit: Payer: Self-pay

## 2019-08-20 ENCOUNTER — Ambulatory Visit (HOSPITAL_COMMUNITY)
Admission: RE | Admit: 2019-08-20 | Discharge: 2019-08-20 | Disposition: A | Discharge status: Home / Self Care | Payer: Medicare Other | Source: Ambulatory Visit | Attending: Nurse Practitioner | Admitting: Nurse Practitioner

## 2019-08-20 DIAGNOSIS — R1032 Left lower quadrant pain: Secondary | ICD-10-CM | POA: Insufficient documentation

## 2019-08-20 DIAGNOSIS — K5730 Diverticulosis of large intestine without perforation or abscess without bleeding: Secondary | ICD-10-CM | POA: Diagnosis not present

## 2019-08-20 DIAGNOSIS — K5731 Diverticulosis of large intestine without perforation or abscess with bleeding: Secondary | ICD-10-CM | POA: Diagnosis not present

## 2019-08-20 DIAGNOSIS — R197 Diarrhea, unspecified: Secondary | ICD-10-CM | POA: Diagnosis not present

## 2019-08-20 LAB — POCT I-STAT CREATININE: Creatinine, Ser: 1 mg/dL (ref 0.44–1.00)

## 2019-08-20 MED ORDER — IOHEXOL 300 MG/ML  SOLN
75.0000 mL | Freq: Once | INTRAMUSCULAR | Status: AC | PRN
  Administered 2019-08-20: 100 mL via INTRAVENOUS

## 2019-08-20 MED ORDER — SODIUM CHLORIDE (PF) 0.9 % IJ SOLN
INTRAMUSCULAR | Status: AC
  Filled 2019-08-20: qty 50

## 2019-08-21 DIAGNOSIS — H353131 Nonexudative age-related macular degeneration, bilateral, early dry stage: Secondary | ICD-10-CM | POA: Diagnosis not present

## 2019-08-25 ENCOUNTER — Emergency Department (HOSPITAL_COMMUNITY)
Admission: EM | Admit: 2019-08-25 | Discharge: 2019-08-26 | Disposition: A | Discharge status: Home / Self Care | Payer: Medicare Other | Attending: Emergency Medicine | Admitting: Emergency Medicine

## 2019-08-25 ENCOUNTER — Emergency Department (HOSPITAL_COMMUNITY): Payer: Medicare Other

## 2019-08-25 DIAGNOSIS — S0012XA Contusion of left eyelid and periocular area, initial encounter: Secondary | ICD-10-CM | POA: Insufficient documentation

## 2019-08-25 DIAGNOSIS — I129 Hypertensive chronic kidney disease with stage 1 through stage 4 chronic kidney disease, or unspecified chronic kidney disease: Secondary | ICD-10-CM | POA: Diagnosis not present

## 2019-08-25 DIAGNOSIS — I10 Essential (primary) hypertension: Secondary | ICD-10-CM | POA: Diagnosis not present

## 2019-08-25 DIAGNOSIS — S022XXA Fracture of nasal bones, initial encounter for closed fracture: Secondary | ICD-10-CM | POA: Insufficient documentation

## 2019-08-25 DIAGNOSIS — I2510 Atherosclerotic heart disease of native coronary artery without angina pectoris: Secondary | ICD-10-CM | POA: Diagnosis not present

## 2019-08-25 DIAGNOSIS — S0181XA Laceration without foreign body of other part of head, initial encounter: Secondary | ICD-10-CM | POA: Diagnosis not present

## 2019-08-25 DIAGNOSIS — Y92019 Unspecified place in single-family (private) house as the place of occurrence of the external cause: Secondary | ICD-10-CM | POA: Diagnosis not present

## 2019-08-25 DIAGNOSIS — Y999 Unspecified external cause status: Secondary | ICD-10-CM | POA: Diagnosis not present

## 2019-08-25 DIAGNOSIS — I4891 Unspecified atrial fibrillation: Secondary | ICD-10-CM | POA: Diagnosis not present

## 2019-08-25 DIAGNOSIS — Z79899 Other long term (current) drug therapy: Secondary | ICD-10-CM | POA: Insufficient documentation

## 2019-08-25 DIAGNOSIS — R040 Epistaxis: Secondary | ICD-10-CM | POA: Insufficient documentation

## 2019-08-25 DIAGNOSIS — N182 Chronic kidney disease, stage 2 (mild): Secondary | ICD-10-CM | POA: Insufficient documentation

## 2019-08-25 DIAGNOSIS — R52 Pain, unspecified: Secondary | ICD-10-CM | POA: Diagnosis not present

## 2019-08-25 DIAGNOSIS — S0990XA Unspecified injury of head, initial encounter: Secondary | ICD-10-CM | POA: Diagnosis not present

## 2019-08-25 DIAGNOSIS — Z7901 Long term (current) use of anticoagulants: Secondary | ICD-10-CM | POA: Insufficient documentation

## 2019-08-25 DIAGNOSIS — Z743 Need for continuous supervision: Secondary | ICD-10-CM | POA: Diagnosis not present

## 2019-08-25 DIAGNOSIS — S01511A Laceration without foreign body of lip, initial encounter: Secondary | ICD-10-CM | POA: Diagnosis not present

## 2019-08-25 DIAGNOSIS — Y9301 Activity, walking, marching and hiking: Secondary | ICD-10-CM | POA: Diagnosis not present

## 2019-08-25 DIAGNOSIS — W109XXA Fall (on) (from) unspecified stairs and steps, initial encounter: Secondary | ICD-10-CM | POA: Insufficient documentation

## 2019-08-25 DIAGNOSIS — W19XXXA Unspecified fall, initial encounter: Secondary | ICD-10-CM | POA: Diagnosis not present

## 2019-08-25 DIAGNOSIS — Z87891 Personal history of nicotine dependence: Secondary | ICD-10-CM | POA: Insufficient documentation

## 2019-08-25 DIAGNOSIS — S0011XA Contusion of right eyelid and periocular area, initial encounter: Secondary | ICD-10-CM | POA: Diagnosis not present

## 2019-08-25 DIAGNOSIS — S0083XA Contusion of other part of head, initial encounter: Secondary | ICD-10-CM

## 2019-08-25 DIAGNOSIS — T07XXXA Unspecified multiple injuries, initial encounter: Secondary | ICD-10-CM | POA: Diagnosis present

## 2019-08-25 DIAGNOSIS — S199XXA Unspecified injury of neck, initial encounter: Secondary | ICD-10-CM | POA: Diagnosis not present

## 2019-08-25 DIAGNOSIS — S81811A Laceration without foreign body, right lower leg, initial encounter: Secondary | ICD-10-CM | POA: Diagnosis not present

## 2019-08-25 LAB — COMPREHENSIVE METABOLIC PANEL
ALT: 9 U/L (ref 0–44)
AST: 23 U/L (ref 15–41)
Albumin: 3.3 g/dL — ABNORMAL LOW (ref 3.5–5.0)
Alkaline Phosphatase: 56 U/L (ref 38–126)
Anion gap: 9 (ref 5–15)
BUN: 18 mg/dL (ref 8–23)
CO2: 21 mmol/L — ABNORMAL LOW (ref 22–32)
Calcium: 8.6 mg/dL — ABNORMAL LOW (ref 8.9–10.3)
Chloride: 103 mmol/L (ref 98–111)
Creatinine, Ser: 1.06 mg/dL — ABNORMAL HIGH (ref 0.44–1.00)
GFR calc Af Amer: 57 mL/min — ABNORMAL LOW (ref >60)
GFR calc non Af Amer: 49 mL/min — ABNORMAL LOW (ref >60)
Glucose, Bld: 89 mg/dL (ref 70–99)
Potassium: 4.1 mmol/L (ref 3.5–5.1)
Sodium: 133 mmol/L — ABNORMAL LOW (ref 135–145)
Total Bilirubin: 0.8 mg/dL (ref 0.3–1.2)
Total Protein: 5.8 g/dL — ABNORMAL LOW (ref 6.5–8.1)

## 2019-08-25 LAB — CBC WITH DIFFERENTIAL/PLATELET
Abs Immature Granulocytes: 0.04 10*3/uL (ref 0.00–0.07)
Basophils Absolute: 0 10*3/uL (ref 0.0–0.1)
Basophils Relative: 0 %
Eosinophils Absolute: 0.2 10*3/uL (ref 0.0–0.5)
Eosinophils Relative: 3 %
HCT: 33.3 % — ABNORMAL LOW (ref 36.0–46.0)
Hemoglobin: 10.7 g/dL — ABNORMAL LOW (ref 12.0–15.0)
Immature Granulocytes: 1 %
Lymphocytes Relative: 18 %
Lymphs Abs: 1.3 10*3/uL (ref 0.7–4.0)
MCH: 31.9 pg (ref 26.0–34.0)
MCHC: 32.1 g/dL (ref 30.0–36.0)
MCV: 99.4 fL (ref 80.0–100.0)
Monocytes Absolute: 0.7 10*3/uL (ref 0.1–1.0)
Monocytes Relative: 10 %
Neutro Abs: 5 10*3/uL (ref 1.7–7.7)
Neutrophils Relative %: 68 %
Platelets: 215 10*3/uL (ref 150–400)
RBC: 3.35 MIL/uL — ABNORMAL LOW (ref 3.87–5.11)
RDW: 14.4 % (ref 11.5–15.5)
WBC: 7.4 10*3/uL (ref 4.0–10.5)
nRBC: 0 % (ref 0.0–0.2)

## 2019-08-25 LAB — PROTIME-INR
INR: 1 (ref 0.8–1.2)
Prothrombin Time: 13.2 seconds (ref 11.4–15.2)

## 2019-08-25 LAB — APTT: aPTT: 45 seconds — ABNORMAL HIGH (ref 24–36)

## 2019-08-25 MED ORDER — TETANUS-DIPHTH-ACELL PERTUSSIS 5-2.5-18.5 LF-MCG/0.5 IM SUSP: 0.5000 mL | Freq: Once | INTRAMUSCULAR | Status: DC

## 2019-08-25 MED ORDER — LIDOCAINE-EPINEPHRINE (PF) 2 %-1:200000 IJ SOLN
20.0000 mL | Freq: Once | INTRAMUSCULAR | Status: AC
  Administered 2019-08-25: 20 mL
  Filled 2019-08-25: qty 20

## 2019-08-25 NOTE — ED Provider Notes (Signed)
[facility] EMERGENCY DEPARTMENT Provider Note   CSN: [id number] Arrival date & time: 08/25/19  2023     History Chief Complaint  Patient presents with   Hailey [name]    IYANNI [name] is a 84 y.o. female.  Patient with history of atrial fibrillation on anticoagulation presents the emergency department after she fell stepping down 2 steps in her down at home.  She fell and struck the right side of her face sustaining a laceration.  Husband heard her fall and went to her side.  She did not lose consciousness although does not member details about the fall.  EMS was called and arrived within a few minutes.  Patient was placed in c-collar and her head was bandaged.  She denies any chest pain or shortness of breath.  No preceding lightheadedness as far as he can recall.  She denies hip pain or back pain.  Patient had a nosebleed.        Past Medical History:  Diagnosis Date   Acute blood loss anemia 12/25/2017   Allergic rhinitis    Anxiety    Arthritis    "my whole spine" (07/01/2017)   Atrial fibrillation (HCC)    Bowel obstruction (HCC)    in [place]   Bradycardia with 41-50 beats per minute 12/27/2017   Cellulitis of leg, right with large prepatella hematoma and open wounds 12/26/2017   CKD (chronic kidney disease) stage 2, GFR 60-89 ml/min    Colon polyps    Coronary artery disease    10/18 PCI/DES to p/m LCx with cutting balloon to mLcx   Diverticulosis of colon    GERD (gastroesophageal reflux disease)    Hip bursitis 2010   Dr Para March, Post op seroma   History of colon polyps    HSV (herpes simplex virus) anogenital infection 07/2019   HTN (hypertension)    IBS (irritable bowel syndrome)    constipation predominant - Dr Earlean Shawl   Lichen sclerosus    Osteopenia 11/2016   T score -2.0 FRAX 15%/4.3%   PAC (premature atrial contraction)    Symptomatiic   Right bundle branch block (RBBB) on electrocardiogram (ECG) 12/27/2017    Scoliosis    SVT (supraventricular tachycardia) ([place])    brief history    Patient Active Problem List   Diagnosis Date Noted   Greater trochanteric pain syndrome of right lower extremity 07/30/2019   Viral URI with cough 08/15/2018   Abdominal pain 05/22/2018   Traumatic hematoma of right knee 01/10/2018   Ulcer of right knee (HCC)    Bradycardia with 41-50 beats per minute 12/27/2017   Right bundle branch block (RBBB) on electrocardiogram (ECG) 12/27/2017   Quadriceps tendon rupture 12/24/2017   CKD (chronic kidney disease), stage II 12/24/2017   Cellulitis of leg, right with large prepatella hematoma and open wounds 12/24/2017   Iliotibial band syndrome of right side 12/16/2017   Depression 07/11/2017   Intervertebral lumbar disc disorder with myelopathy, lumbar region 04/25/2017   Gastroenteritis 09/13/2016   Chronic anticoagulation 05/24/2015   Coronary artery disease involving native coronary artery of native heart with angina pectoris ([place]) 12/20/2014   Iliotibial band syndrome of left side 11/16/2014   Diverticulitis of colon 08/30/2014   PAF (paroxysmal atrial fibrillation) ([place]) 11/21/2012   Greater trochanteric bursitis of both hips 05/21/2012   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   Osteoarthritis of left hip 07/03/2010   DEGENERATIVE DISC DISEASE, LUMBOSACRAL SPINE 05/18/2010   SYNCOPE 10/27/2008   Essential hypertension 06/16/2007  GERD 06/16/2007   COLONIC POLYPS, HX OF 06/16/2007    Past Surgical History:  Procedure Laterality Date   ANTERIOR AND POSTERIOR VAGINAL REPAIR  01/2002   Archie Endo 01/23/2011   APPENDECTOMY  1948   CARDIAC CATHETERIZATION  06/26/2017   CORONARY ANGIOPLASTY WITH STENT PLACEMENT  07/01/2017   CORONARY ATHERECTOMY N/A 07/01/2017   Procedure: CORONARY ATHERECTOMY;  Surgeon: Belva Crome, MD;  Location: [facility];  Service: Cardiovascular;  Laterality: N/A;   CORONARY STENT INTERVENTION N/A  07/01/2017   Procedure: CORONARY STENT INTERVENTION;  Surgeon: Belva Crome, MD;  Location: [place] [facility];  Service: Cardiovascular;  Laterality: N/A;   HAMMER TOE SURGERY     HEMORRHOID BANDING     HIP SURGERY Left 04/2009   hip examination under anesthesia followed by greater trochanteric bursectomy; iliotibial band tenotomy/notes 01/20/2011   I&D EXTREMITY Right 01/10/2018   Procedure: IRRIGATION AND DEBRIDEMENT RIGHT KNEE, APPLY WOUND VAC;  Surgeon: Newt Minion, MD;  Location: [facility];  Service: Orthopedics;  Laterality: Right;   KNEE BURSECTOMY Right 04/2009   Archie Endo 01/09/2011   LEFT HEART CATH AND CORONARY ANGIOGRAPHY N/A 06/26/2017   Procedure: LEFT HEART CATH AND CORONARY ANGIOGRAPHY;  Surgeon: Belva Crome, MD;  Location: [facility];  Service: Cardiovascular;  Laterality: N/A;   PUBOVAGINAL SLING  01/2002   Archie Endo 01/23/2011   REDUCTION MAMMAPLASTY     TEMPORARY PACEMAKER N/A 07/01/2017   Procedure: TEMPORARY PACEMAKER;  Surgeon: Belva Crome, MD;  Location: [facility];  Service: Cardiovascular;  Laterality: N/A;   VAGINAL HYSTERECTOMY  01/2002   Vaginal hysterectomy, bilateral salpingo-oophorectomy/notes 01/23/2011     OB History    Gravida  2   Para  2   Term  2   Preterm      AB      Living  1     SAB      TAB      Ectopic      Multiple      Live Births              Family History  Problem Relation Age of Onset   Colon cancer Mother    Diabetes Father    Prostate cancer Father    Prostate cancer Brother    Pancreatic cancer Brother    Stomach cancer Son    Heart attack Neg Hx    Stroke Neg Hx     Social History   Tobacco Use   Smoking status: Former Smoker    Packs/day: 0.25    Years: 28.00    Pack years: 7.00    Types: Cigarettes    Quit date: 1981    Years since quitting: 39.9   Smokeless tobacco: Never Used  Substance Use Topics   Alcohol use: Yes    Alcohol/week: 7.0 standard drinks      Types: 7 Standard drinks or equivalent per week    Comment: 7 vodka drinks a week   Drug use: No    Home Medications Prior to Admission medications   Medication Sig Start Date End Date Taking? Authorizing Provider  acetaminophen (TYLENOL) 500 MG tablet Take 1,000 mg by mouth every 6 (six) hours as needed (Coated or capsules).    [provider]  apixaban (ELIQUIS) 2.5 MG TABS tablet Take 1 tablet (2.5 mg total) by mouth 2 (two) times daily. 11/17/18   Belva Crome, MD  atorvastatin (LIPITOR) 80 MG tablet TAKE  1 TABLET AT 6PM. 03/06/19   Belva Crome, MD  cetirizine (ZYRTEC) 10 MG tablet Take 10 mg by mouth daily.    [provider]  clobetasol cream (TEMOVATE) 0.05 % APPLY NIGHTLY AS DIRECTED. 06/29/19   Fontaine, Belinda Block, MD  DULoxetine (CYMBALTA) 60 MG capsule TAKE 1 CAPSULE DAILY. 06/26/19   Plotnikov, Evie Lacks, MD  esomeprazole (NEXIUM) 40 MG capsule Take 1 capsule (40 mg total) by mouth daily. 03/24/19 03/23/20  Plotnikov, Evie Lacks, MD  hyoscyamine (LEVSIN) 0.125 MG tablet Take 1-2 tablets (0.125-0.25 mg total) by mouth every 4 (four) hours as needed for up to 10 days for cramping. 04/01/19 04/11/19  Plotnikov, Evie Lacks, MD  LORazepam (ATIVAN) 1 MG tablet Take 1 tablet (1 mg total) by mouth 2 (two) times daily as needed for anxiety or sleep. 04/01/19   Plotnikov, Evie Lacks, MD  metoprolol succinate (TOPROL-XL) 25 MG 24 hr tablet TAKE 3 TABLETS DAILY WITH OR IMMEDIATELY FOLLOWING A MEAL. 03/06/19   Plotnikov, Evie Lacks, MD  nitroGLYCERIN (NITROSTAT) 0.4 MG SL tablet Place 0.4 mg under the tongue every 5 (five) minutes as needed for chest pain (Call 911 at 3rd dose within 15 minutes.).    [provider]  NONFORMULARY OR COMPOUNDED ITEM Estradiol 0.02% vag cream prefilled applicators  S: insert appful (1 gram) intravaginally twice weekly. 02/09/19   Fontaine, Belinda Block, MD  Probiotic Product (ALIGN PO) Take 1 capsule by mouth daily.    [provider]   sodium chloride (OCEAN) 0.65 % SOLN nasal spray Place 1 spray into both nostrils as needed for congestion.    [provider]  terconazole (TERAZOL 7) 0.4 % vaginal cream Place 1 applicator vaginally at bedtime. 07/23/19   Fontaine, Belinda Block, MD  valACYclovir (VALTREX) 500 MG tablet Take one tablet by mouth twice daily for 5 days for early onset symptoms. 07/27/19   Fontaine, Belinda Block, MD    Allergies    Macrobid [nitrofurantoin monohyd macro], Meloxicam, Digoxin and related, Mobic [meloxicam], and Sulfamethoxazole-trimethoprim  Review of Systems   Review of Systems  Constitutional: Negative for fatigue.  HENT: Positive for nosebleeds. Negative for tinnitus.   Eyes: Negative for photophobia, pain and visual disturbance.  Respiratory: Negative for shortness of breath.   Cardiovascular: Negative for chest pain.  Gastrointestinal: Negative for nausea and vomiting.  Musculoskeletal: Negative for back pain, gait problem and neck pain.  Skin: Positive for wound.  Neurological: Positive for headaches. Negative for dizziness, weakness, light-headedness and numbness.  Psychiatric/Behavioral: Negative for confusion and decreased concentration.    Physical Exam Updated Vital Signs BP (!) 174/98 (BP Location: Right Arm)    Pulse 60    Temp 97.9 F (36.6 C)    Resp 20    SpO2 95%   Physical Exam Vitals and nursing note reviewed.  Constitutional:      Appearance: She is well-developed.  HENT:     Head: Normocephalic. No raccoon eyes or Battle's sign.     Comments: Patient with significant ecchymosis around both eyes, worse on the right side.  Patient is tender around the right orbit and the bridge of the nose with mild edema.   Above the right eye extending to above the bridge of the nose, there is a 5 cm actively bleeding laceration that extends through the subcutaneous tissue. Minimal gaping of the wound.  In the mid face above the upper lip there is a 1 cm laceration that  appears to be through and through  into the mucosal surface of the upper lip. This does not cross the vermilion border. This is reasonably well approximated with no active bleeding. Wound is clean.    Right Ear: Tympanic membrane, ear canal and external ear normal. No hemotympanum.     Left Ear: Tympanic membrane, ear canal and external ear normal. No hemotympanum.     Nose:     Comments: Dried blood in the right nare.    Mouth/Throat:     Pharynx: Uvula midline.     Comments: Dried blood noted within the oropharynx but no active bleeding or significant trauma noted.  No dental injury noted. Eyes:     General: Lids are normal. No allergic shiner.    Extraocular Movements:     Right eye: No nystagmus.     Left eye: No nystagmus.     Conjunctiva/sclera: Conjunctivae normal.     Right eye: Right conjunctiva is not injected. No chemosis or exudate.    Left eye: Left conjunctiva is not injected. No chemosis or exudate.    Pupils: Pupils are equal, round, and reactive to light.     Comments: No visible hyphema noted.  EOMs fully intact in all directions.  Normal pupillary response to light.  Patient does need some assistance with opening her right eyelid due to traumatic edema of the eyelids.  Cardiovascular:     Rate and Rhythm: Normal rate and regular rhythm.  Pulmonary:     Effort: Pulmonary effort is normal.     Breath sounds: Normal breath sounds.  Abdominal:     Palpations: Abdomen is soft.     Tenderness: There is no abdominal tenderness.  Musculoskeletal:     Right shoulder: Normal.     Left shoulder: Normal.     Right elbow: Normal.     Left elbow: Normal.     Cervical back: Normal range of motion and neck supple. No tenderness or bony tenderness.     Thoracic back: No tenderness or bony tenderness.     Lumbar back: No tenderness or bony tenderness.     Right hip: Normal. Normal range of motion.     Left hip: Normal. Normal range of motion.  Skin:    General: Skin is warm and  dry.     Comments: 15 cm C-shaped skin tear to the right shin, clean, hemostatic. Skin flap approximates reasonably well.   Neurological:     Mental Status: She is alert and oriented to person, place, and time.     GCS: GCS eye subscore is 4. GCS verbal subscore is 5. GCS motor subscore is 6.     Cranial Nerves: No cranial nerve deficit.     Sensory: No sensory deficit.     Coordination: Coordination normal.     Deep Tendon Reflexes: Reflexes are normal and symmetric.     ED Results / Procedures / Treatments   Labs (all labs ordered are listed, but only abnormal results are displayed) Labs Reviewed  CBC WITH DIFFERENTIAL/PLATELET - Abnormal; Notable for the following components:      Result Value   RBC 3.35 (*)    Hemoglobin 10.7 (*)    HCT 33.3 (*)    All other components within normal limits  COMPREHENSIVE METABOLIC PANEL - Abnormal; Notable for the following components:   Sodium 133 (*)    CO2 21 (*)    Creatinine, Ser 1.06 (*)    Calcium 8.6 (*)    Total Protein 5.8 (*)  Albumin 3.3 (*)    GFR calc non Af Amer 49 (*)    GFR calc Af Amer 57 (*)    All other components within normal limits  APTT - Abnormal; Notable for the following components:   aPTT 45 (*)    All other components within normal limits  PROTIME-INR    EKG None  Radiology CT Head Wo Contrast  Result Date: 08/25/2019 CLINICAL DATA:  Facial trauma, mechanical fall. EXAM: CT HEAD WITHOUT CONTRAST CT MAXILLOFACIAL WITHOUT CONTRAST CT CERVICAL SPINE WITHOUT CONTRAST TECHNIQUE: Multidetector CT imaging of the head, cervical spine, and maxillofacial structures were performed using the standard protocol without intravenous contrast. Multiplanar CT image reconstructions of the cervical spine and maxillofacial structures were also generated. COMPARISON:  November 27, 2013 FINDINGS: CT HEAD FINDINGS Brain: No evidence of acute infarction, hemorrhage, hydrocephalus, extra-axial collection or mass lesion/mass effect.  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 Vascular: No hyperdense vessel or unexpected calcification. Skull: Normal. Negative for fracture or focal lesion. Other: Facial fractures, see below. CT MAXILLOFACIAL FINDINGS Osseous: Bilateral nasal bone fractures with comminution. Buckling of the anterior nasal septum. No signs of orbital fracture. Pterygoid plates are intact. Mandible is located. Orbits: Extensive preseptal swelling over the right preseptal swelling also overlying the left orbit. No signs of retro bulbar stranding. No signs of orbital fracture. Sinuses: Paranasal sinuses are clear. There is some blood in the nasal cavity anteriorly. Soft tissues: Extensive preseptal hematoma overlying the right orbit, minimal changes on the left and with some stranding over the nose at the site of nasal bone fractures. CT CERVICAL SPINE FINDINGS Alignment: Mild straightening of normal cervical lordosis likely positional. Mild anterolisthesis of C3 on C4 slightly worse than in 2015, seen in the setting of degenerative changes. Skull base and vertebrae: No acute fracture. No primary bone lesion or focal pathologic process. Soft tissues and spinal canal: No prevertebral fluid or swelling. No visible canal hematoma. Disc levels: Degenerative changes throughout the cervical spine, mild 2 mm anterolisthesis of C3 on C4, near complete loss of the disc space at C4-C5, C5-C6 and C6-C7. Similar changes at C7-T1. Upper chest: Negative. Other: None IMPRESSION: 1. No acute intracranial abnormality. 2. 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 3. Bilateral nasal bone fractures with comminution. Buckling of the nasal septum. 4. Extensive preseptal swelling over the right orbit and mild changes overlying the left orbit. No signs of orbital fracture. 5. No evidence of acute fracture or subluxation of the cervical spine. 6. Multilevel degenerative changes of the cervical spine,  most prominent at C4-C5, C5-C6 and C6-C7. Electronically Signed   By: Zetta Bills M.D.   On: 08/25/2019 21:55   CT Cervical Spine Wo Contrast  Result Date: 08/25/2019 CLINICAL DATA:  Facial trauma, mechanical fall. EXAM: CT HEAD WITHOUT CONTRAST CT MAXILLOFACIAL WITHOUT CONTRAST CT CERVICAL SPINE WITHOUT CONTRAST TECHNIQUE: Multidetector CT imaging of the head, cervical spine, and maxillofacial structures were performed using the standard protocol without intravenous contrast. Multiplanar CT image reconstructions of the cervical spine and maxillofacial structures were also generated. COMPARISON:  November 27, 2013 FINDINGS: CT HEAD FINDINGS Brain: No evidence of acute infarction, hemorrhage, hydrocephalus, extra-axial collection or mass lesion/mass effect. 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 Vascular: No hyperdense vessel or unexpected calcification. Skull: Normal. Negative for fracture or focal lesion. Other: Facial fractures, see below. CT MAXILLOFACIAL FINDINGS Osseous: Bilateral nasal bone fractures  with comminution. Buckling of the anterior nasal septum. No signs of orbital fracture. Pterygoid plates are intact. Mandible is located. Orbits: Extensive preseptal swelling over the right preseptal swelling also overlying the left orbit. No signs of retro bulbar stranding. No signs of orbital fracture. Sinuses: Paranasal sinuses are clear. There is some blood in the nasal cavity anteriorly. Soft tissues: Extensive preseptal hematoma overlying the right orbit, minimal changes on the left and with some stranding over the nose at the site of nasal bone fractures. CT CERVICAL SPINE FINDINGS Alignment: Mild straightening of normal cervical lordosis likely positional. Mild anterolisthesis of C3 on C4 slightly worse than in 2015, seen in the setting of degenerative changes. Skull base and vertebrae: No acute fracture. No primary bone lesion or focal pathologic process.  Soft tissues and spinal canal: No prevertebral fluid or swelling. No visible canal hematoma. Disc levels: Degenerative changes throughout the cervical spine, mild 2 mm anterolisthesis of C3 on C4, near complete loss of the disc space at C4-C5, C5-C6 and C6-C7. Similar changes at C7-T1. Upper chest: Negative. Other: None IMPRESSION: 1. No acute intracranial abnormality. 2. 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 3. Bilateral nasal bone fractures with comminution. Buckling of the nasal septum. 4. Extensive preseptal swelling over the right orbit and mild changes overlying the left orbit. No signs of orbital fracture. 5. No evidence of acute fracture or subluxation of the cervical spine. 6. Multilevel degenerative changes of the cervical spine, most prominent at C4-C5, C5-C6 and C6-C7. Electronically Signed   By: Zetta Bills M.D.   On: 08/25/2019 21:55   CT MAXILLOFACIAL WO CONTRAST  Result Date: 08/25/2019 CLINICAL DATA:  Facial trauma, mechanical fall. EXAM: CT HEAD WITHOUT CONTRAST CT MAXILLOFACIAL WITHOUT CONTRAST CT CERVICAL SPINE WITHOUT CONTRAST TECHNIQUE: Multidetector CT imaging of the head, cervical spine, and maxillofacial structures were performed using the standard protocol without intravenous contrast. Multiplanar CT image reconstructions of the cervical spine and maxillofacial structures were also generated. COMPARISON:  November 27, 2013 FINDINGS: CT HEAD FINDINGS Brain: No evidence of acute infarction, hemorrhage, hydrocephalus, extra-axial collection or mass lesion/mass effect. 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 Vascular: No hyperdense vessel or unexpected calcification. Skull: Normal. Negative for fracture or focal lesion. Other: Facial fractures, see below. CT MAXILLOFACIAL FINDINGS Osseous: Bilateral nasal bone fractures with comminution. Buckling of the anterior nasal septum. No signs of orbital fracture. Pterygoid plates are  intact. Mandible is located. Orbits: Extensive preseptal swelling over the right preseptal swelling also overlying the left orbit. No signs of retro bulbar stranding. No signs of orbital fracture. Sinuses: Paranasal sinuses are clear. There is some blood in the nasal cavity anteriorly. Soft tissues: Extensive preseptal hematoma overlying the right orbit, minimal changes on the left and with some stranding over the nose at the site of nasal bone fractures. CT CERVICAL SPINE FINDINGS Alignment: Mild straightening of normal cervical lordosis likely positional. Mild anterolisthesis of C3 on C4 slightly worse than in 2015, seen in the setting of degenerative changes. Skull base and vertebrae: No acute fracture. No primary bone lesion or focal pathologic process. Soft tissues and spinal canal: No prevertebral fluid or swelling. No visible canal hematoma. Disc levels: Degenerative changes throughout the cervical spine, mild 2 mm anterolisthesis of C3 on C4, near complete loss of the disc space at C4-C5, C5-C6 and C6-C7. Similar changes at C7-T1. Upper chest: Negative. Other: None IMPRESSION: 1. No acute intracranial abnormality. 2. Signs of  chronic infarct in the high left parietal region. 3. Bilateral nasal bone fractures with comminution. Buckling of the nasal septum. 4. Extensive preseptal swelling over the right orbit and mild changes overlying the left orbit. No signs of orbital fracture. 5. No evidence of acute fracture or subluxation of the cervical spine. 6. Multilevel degenerative changes of the cervical spine, most prominent at C4-C5, C5-C6 and C6-C7. Electronically Signed   By: Zetta Bills M.D.   On: 08/25/2019 21:55    Procedures .Marland KitchenLaceration Repair  Date/Time: 08/26/2019 12:07 AM Performed by: Carlisle Cater, PA-C Authorized by: Carlisle Cater, PA-C   Consent:    Consent obtained:  Verbal   Consent given by:  Patient   Risks discussed:  Poor cosmetic result, infection, pain, retained foreign  body and nerve damage   Alternatives discussed:  No treatment Anesthesia (see MAR for exact dosages):    Anesthesia method:  Local infiltration   Local anesthetic:  Lidocaine 2% WITH epi Laceration details:    Location:  Face   Face location:  Forehead   Length (cm):  5 Repair type:    Repair type:  Simple Pre-procedure details:    Preparation:  Patient was prepped and draped in usual sterile fashion Exploration:    Hemostasis achieved with:  Epinephrine   Wound exploration: wound explored through full range of motion and entire depth of wound probed and visualized     Contaminated: no   Treatment:    Area cleansed with:  Shur-Clens   Amount of cleaning:  Standard Skin repair:    Repair method:  Staples and sutures   Suture size:  6-0   Suture material:  Nylon   Suture technique:  Simple interrupted   Number of sutures:  14 Approximation:    Approximation:  Close Post-procedure details:    Dressing:  Open (no dressing)   Patient tolerance of procedure:  Tolerated well, no immediate complications .Marland KitchenLaceration Repair  Date/Time: 08/26/2019 12:09 AM Performed by: Carlisle Cater, PA-C Authorized by: Carlisle Cater, PA-C   Consent:    Consent obtained:  Verbal   Consent given by:  Patient   Risks discussed:  Infection, pain, retained foreign body, poor cosmetic result, poor wound healing and nerve damage   Alternatives discussed:  No treatment Anesthesia (see MAR for exact dosages):    Anesthesia method:  Local infiltration   Local anesthetic:  Lidocaine 2% WITH epi Laceration details:    Length (cm):  1 Repair type:    Repair type:  Simple Pre-procedure details:    Preparation:  Patient was prepped and draped in usual sterile fashion and imaging obtained to evaluate for foreign bodies Exploration:    Hemostasis achieved with:  Epinephrine   Wound exploration: wound explored through full range of motion and entire depth of wound probed and visualized   Treatment:     Area cleansed with:  Shur-Clens   Amount of cleaning:  Standard Skin repair:    Repair method:  Sutures   Suture size:  6-0   Suture material:  Nylon   Suture technique:  Simple interrupted   Number of sutures:  2 Approximation:    Approximation:  Close Post-procedure details:    Dressing:  Open (no dressing)   (including critical care time)  Medications Ordered in ED Medications  lidocaine-EPINEPHrine (XYLOCAINE W/EPI) 2 %-1:200000 (PF) injection 20 mL (20 mLs Infiltration Given 08/25/19 2351)    ED Course  I have reviewed the triage vital signs and the nursing notes.  Pertinent  labs & imaging results that were available during my care of the patient were reviewed by me and considered in my medical decision making (see chart for details).    MDM Rules/Calculators/A&P                      Patient seen and examined. Work-up initiated.   Vital signs reviewed and are as follows: BP (!) 160/87 (BP Location: Right Arm)    Pulse (!) 58    Temp 97.9 F (36.6 C)    Resp 17    SpO2 95%   CT results reviewed with patient and her husband at bedside. I took down the dressing on the patient's forehead and evaluated the patient's wound. Wound repaired as above. I then cleaned and explored the wound above the patient's upper lip. This was found to be through and through.  Patient was ambulated without any difficulty. After wound repair patient was found to have a large skin tear to the right anterior chin. This was cleaned and was entirely superficial. The skin reapproximated well. I applied Vaseline gauze to the area and covered with regular gauze. These were secured in place with 3 Tegaderm dressings.  Patient counseled on wound care. Patient counseled on need to return or see PCP/urgent care for suture removal in 7 days. Patient was urged to return to the Emergency Department urgently with worsening pain, swelling, expanding erythema especially if it streaks away from the affected area,  fever, or if they have any other concerns. Patient verbalized understanding.   Patient counseled to remove Vaseline gauze in 48 hours and to gently clean lower extremity wound. Counseled to monitor for infection as above.  Patient wants to use Tylenol only at home for pain control.  Discussed tetanus update with patient. She eventually declines stating she will follow-up with her primary care doctor. ENT follow-up given as well.  Final Clinical Impression(s) / ED Diagnoses Final diagnoses:  Contusion of face, initial encounter  Facial laceration, initial encounter  Lip laceration, initial encounter  Noninfected skin tear of right lower extremity, initial encounter  Closed displaced fracture of nasal bone, initial encounter   Fall: Mechanical in nature, patient tripped going down 2 stairs. Imaging of the head, face, neck shows comminuted nasal bone fractures, otherwise no severe trauma.  Forehead laceration: Large amount of venous oozing due to anticoagulation, improved after wound was closed. No visible or palpated foreign body on exam. No foreign bodies noted on imaging. Wound repaired without complication.  Lip laceration: Hemostatic and clean. This is through and through. Patient counseled on keeping the internal wound clean after eating. No visible or palpated foreign body on exam. No foreign bodies noted on imaging. Wound repaired without complication.  Comminuted nasal bone fracture: Closed. Will need to be monitored, ENT follow-up if needed when swelling improves.  Lower extremity skin tear: Skin reapproximated as well as possible and dressed. Do not feel that suture repair is indicated given fragility of tissue. Patient declines tetanus.      Rx / DC Orders ED Discharge Orders    None       Carlisle Cater, PA-C 08/26/19 0038    Gareth Morgan, MD 08/28/19 [phone]

## 2019-08-25 NOTE — ED Triage Notes (Signed)
Came in via EMS; reported mechanical fall off of approx 2 steps; falling forward. Unknown LOC; pt is reported to be on Eliquis. Pt has no recolection of the fall

## 2019-08-26 ENCOUNTER — Other Ambulatory Visit: Payer: Self-pay

## 2019-08-26 ENCOUNTER — Ambulatory Visit (INDEPENDENT_AMBULATORY_CARE_PROVIDER_SITE_OTHER)
Admission: RE | Admit: 2019-08-26 | Discharge: 2019-08-26 | Disposition: A | Discharge status: Home / Self Care | Payer: Medicare Other | Source: Ambulatory Visit | Attending: Internal Medicine | Admitting: Internal Medicine

## 2019-08-26 ENCOUNTER — Ambulatory Visit (INDEPENDENT_AMBULATORY_CARE_PROVIDER_SITE_OTHER): Payer: Medicare Other | Admitting: Internal Medicine

## 2019-08-26 ENCOUNTER — Encounter: Payer: Self-pay | Admitting: Internal Medicine

## 2019-08-26 VITALS — BP 134/86 | HR 59 | Temp 98.0°F | Ht 64.0 in | Wt 160.0 lb

## 2019-08-26 DIAGNOSIS — S060XAA Concussion with loss of consciousness status unknown, initial encounter: Secondary | ICD-10-CM | POA: Insufficient documentation

## 2019-08-26 DIAGNOSIS — M25531 Pain in right wrist: Secondary | ICD-10-CM | POA: Diagnosis not present

## 2019-08-26 DIAGNOSIS — S0181XA Laceration without foreign body of other part of head, initial encounter: Secondary | ICD-10-CM

## 2019-08-26 DIAGNOSIS — I480 Paroxysmal atrial fibrillation: Secondary | ICD-10-CM

## 2019-08-26 DIAGNOSIS — S022XXG Fracture of nasal bones, subsequent encounter for fracture with delayed healing: Secondary | ICD-10-CM

## 2019-08-26 DIAGNOSIS — S80811D Abrasion, right lower leg, subsequent encounter: Secondary | ICD-10-CM

## 2019-08-26 DIAGNOSIS — S022XXA Fracture of nasal bones, initial encounter for closed fracture: Secondary | ICD-10-CM | POA: Insufficient documentation

## 2019-08-26 DIAGNOSIS — S060X1D Concussion with loss of consciousness of 30 minutes or less, subsequent encounter: Secondary | ICD-10-CM | POA: Diagnosis not present

## 2019-08-26 DIAGNOSIS — S060X9A Concussion with loss of consciousness of unspecified duration, initial encounter: Secondary | ICD-10-CM | POA: Insufficient documentation

## 2019-08-26 DIAGNOSIS — M7989 Other specified soft tissue disorders: Secondary | ICD-10-CM | POA: Diagnosis not present

## 2019-08-26 DIAGNOSIS — L089 Local infection of the skin and subcutaneous tissue, unspecified: Secondary | ICD-10-CM

## 2019-08-26 DIAGNOSIS — S60211A Contusion of right wrist, initial encounter: Secondary | ICD-10-CM | POA: Diagnosis not present

## 2019-08-26 MED ORDER — COQ10 200 MG PO CAPS: ORAL_CAPSULE | ORAL | 0 refills | Status: DC

## 2019-08-26 NOTE — Discharge Instructions (Signed)
Please read and follow all provided instructions.  Your diagnoses today include:  1. Contusion of face, initial encounter   2. Facial laceration, initial encounter   3. Lip laceration, initial encounter   4. Noninfected skin tear of right lower extremity, initial encounter   5. Closed displaced fracture of nasal bone, initial encounter     Tests performed today include:  CT scan of your head, face, and cervical spine that showed nasal bone fractures otherwise no serious injury.  Blood counts and electrolytes  Vital signs. See below for your results today.   Medications prescribed:   None  Take any prescribed medications only as directed.  Home care instructions:  Follow any educational materials contained in this packet.  Remove the dressing on your lower leg in 48 hours, clean the wound twice a day with mild soap and water and apply gauze dressing until the area heals.  BE VERY CAREFUL not to take multiple medicines containing Tylenol (also called acetaminophen). Doing so can lead to an overdose which can damage your liver and cause liver failure and possibly death.   Follow-up instructions: Please follow-up with your primary care provider in the next 7 days for wound check and possible suture removal. Once the swelling around your face and nose improves, please follow-up with the ENT doctor if there are any cosmetic concerns or if you are having difficulty breathing through one side of your nose.  Return instructions:  SEEK IMMEDIATE MEDICAL ATTENTION IF:  There is confusion or drowsiness (although children frequently become drowsy after injury).   You cannot awaken the injured person.   You have more than one episode of vomiting.   You notice dizziness or unsteadiness which is getting worse, or inability to walk.   You have convulsions or unconsciousness.   You experience severe, persistent headaches not relieved by Tylenol.  You cannot use arms or legs normally.     There are changes in pupil sizes. (This is the black center in the colored part of the eye)   There is clear or bloody discharge from the nose or ears.   You have change in speech, vision, swallowing, or understanding.   Localized weakness, numbness, tingling, or change in bowel or bladder control.  You have any other emergent concerns.  Additional Information: You have had a head injury which does not appear to require admission at this time.  Your vital signs today were: BP (!) 160/87 (BP Location: Right Arm)   Pulse (!) 58   Temp 97.9 F (36.6 C)   Resp 17   SpO2 95%  If your blood pressure (BP) was elevated above 135/85 this visit, please have this repeated by your doctor within one month. --------------

## 2019-08-26 NOTE — Progress Notes (Signed)
Subjective:  Patient ID: Hailey [name], female    DOB: 10-Jan-1935  Age: 84 y.o. MRN: [id number]  CC: No chief complaint on file.   HPI [name] presents for a fall down 2 steps at night last night  Per ER:  " Fall: Mechanical in nature, patient tripped going down 2 stairs. Imaging of the head, face, neck shows comminuted nasal bone fractures, otherwise no severe trauma.  Forehead laceration: Large amount of venous oozing due to anticoagulation, improved after wound was closed. No visible or palpated foreign body on exam. No foreign bodies noted on imaging. Wound repaired without complication.  Lip laceration: Hemostatic and clean. This is through and through. Patient counseled on keeping the internal wound clean after eating. No visible or palpated foreign body on exam. No foreign bodies noted on imaging. Wound repaired without complication.  Comminuted nasal bone fracture: Closed. Will need to be monitored, ENT follow-up if needed when swelling improves.  Lower extremity skin tear: Skin reapproximated as well as possible and dressed. Do not feel that suture repair is indicated given fragility of tissue. Patient declines tetanus."    Outpatient Medications Prior to Visit  Medication Sig Dispense Refill  . acetaminophen (TYLENOL) 500 MG tablet Take 1,000 mg by mouth every 6 (six) hours as needed (Coated or capsules).    Marland Kitchen apixaban (ELIQUIS) 2.5 MG TABS tablet Take 1 tablet (2.5 mg total) by mouth 2 (two) times daily. 90 tablet 3  . atorvastatin (LIPITOR) 80 MG tablet TAKE 1 TABLET AT 6PM. 90 tablet 3  . cetirizine (ZYRTEC) 10 MG tablet Take 10 mg by mouth daily.    . clobetasol cream (TEMOVATE) 0.05 % APPLY NIGHTLY AS DIRECTED. 30 g 1  . DULoxetine (CYMBALTA) 60 MG capsule TAKE 1 CAPSULE DAILY. 90 capsule 0  . esomeprazole (NEXIUM) 40 MG capsule Take 1 capsule (40 mg total) by mouth daily. 90 capsule 3  . LORazepam (ATIVAN) 1 MG tablet Take 1 tablet (1 mg  total) by mouth 2 (two) times daily as needed for anxiety or sleep. 180 tablet 1  . metoprolol succinate (TOPROL-XL) 25 MG 24 hr tablet TAKE 3 TABLETS DAILY WITH OR IMMEDIATELY FOLLOWING A MEAL. 270 tablet 3  . nitroGLYCERIN (NITROSTAT) 0.4 MG SL tablet Place 0.4 mg under the tongue every 5 (five) minutes as needed for chest pain (Call 911 at 3rd dose within 15 minutes.).    Marland Kitchen NONFORMULARY OR COMPOUNDED ITEM Estradiol 0.02% vag cream prefilled applicators  S: insert appful (1 gram) intravaginally twice weekly. 24 each 3  . Probiotic Product (ALIGN PO) Take 1 capsule by mouth daily.    . sodium chloride (OCEAN) 0.65 % SOLN nasal spray Place 1 spray into both nostrils as needed for congestion.    Marland Kitchen terconazole (TERAZOL 7) 0.4 % vaginal cream Place 1 applicator vaginally at bedtime. 45 g 0  . valACYclovir (VALTREX) 500 MG tablet Take one tablet by mouth twice daily for 5 days for early onset symptoms. 30 tablet 0  . hyoscyamine (LEVSIN) 0.125 MG tablet Take 1-2 tablets (0.125-0.25 mg total) by mouth every 4 (four) hours as needed for up to 10 days for cramping. 100 tablet 3   No facility-administered medications prior to visit.    ROS: Review of Systems  Constitutional: Positive for fatigue. Negative for activity change, appetite change, chills and unexpected weight change.  HENT: Negative for congestion, mouth sores and sinus pressure.   Eyes: Negative for visual disturbance.  Respiratory: Negative for cough and chest tightness.   Gastrointestinal: Negative for abdominal pain and nausea.  Genitourinary: Negative for difficulty urinating, frequency and vaginal pain.  Musculoskeletal: Positive for arthralgias and gait problem. Negative for back pain.  Skin: Positive for color change and wound. Negative for pallor and rash.  Neurological: Positive for headaches. Negative for dizziness, tremors, weakness and numbness.  Psychiatric/Behavioral: Negative for confusion, sleep disturbance and suicidal  ideas.    Objective:  BP 134/86 (BP Location: Left Arm, Patient Position: Sitting, Cuff Size: Normal)   Pulse (!) 59   Temp 98 F (36.7 C) (Oral)   Ht 5\' 4"  (1.626 m)   Wt 160 lb (72.6 kg)   SpO2 99%   BMI 27.46 kg/m   BP Readings from Last 3 Encounters:  08/26/19 134/86  08/26/19 (!) 160/87  08/17/19 114/80    Wt Readings from Last 3 Encounters:  08/26/19 160 lb (72.6 kg)  08/17/19 156 lb (70.8 kg)  07/30/19 160 lb (72.6 kg)    Physical Exam Constitutional:      General: She is not in acute distress.    Appearance: She is well-developed.  HENT:     Head: Normocephalic.     Right Ear: External ear normal.     Left Ear: External ear normal.     Nose: Congestion present.  Eyes:     General:        Right eye: No discharge.        Left eye: No discharge.     Conjunctiva/sclera: Conjunctivae normal.     Pupils: Pupils are equal, round, and reactive to light.  Neck:     Thyroid: No thyromegaly.     Vascular: No JVD.     Trachea: No tracheal deviation.  Cardiovascular:     Rate and Rhythm: Normal rate and regular rhythm.     Heart sounds: Normal heart sounds.  Pulmonary:     Effort: No respiratory distress.     Breath sounds: No stridor. No wheezing.  Abdominal:     General: Bowel sounds are normal. There is no distension.     Palpations: Abdomen is soft. There is no mass.     Tenderness: There is no abdominal tenderness. There is no guarding or rebound.  Musculoskeletal:        General: Tenderness and signs of injury present.     Cervical back: Normal range of motion and neck supple.  Lymphadenopathy:     Cervical: No cervical adenopathy.  Skin:    Findings: Bruising present. No erythema or rash.  Neurological:     Mental Status: She is oriented to person, place, and time.     Cranial Nerves: No cranial nerve deficit.     Motor: No abnormal muscle tone.     Coordination: Coordination normal.     Deep Tendon Reflexes: Reflexes normal.  Psychiatric:         Behavior: Behavior normal.        Thought Content: Thought content normal.        Judgment: Judgment normal.   Multiple bruises, hematomas on the face.  Swollen nose forehead laceration lip laceration sutured.  Slight bleeding from the lip laceration Large abrasion on the right lower leg, dressed Multiple bruises.  FTF>40 min discussing wound care, care plan  Lab Results  Component Value Date   WBC 7.4 08/25/2019   HGB 10.7 (L) 08/25/2019   HCT 33.3 (L) 08/25/2019   PLT 215 08/25/2019   GLUCOSE 89  08/25/2019   CHOL 171 11/26/2016   TRIG 99 11/26/2016   HDL 90 11/26/2016   LDLDIRECT 89.1 06/29/2013   LDLCALC 61 11/26/2016   ALT 9 08/25/2019   AST 23 08/25/2019   NA 133 (L) 08/25/2019   K 4.1 08/25/2019   CL 103 08/25/2019   CREATININE 1.06 (H) 08/25/2019   BUN 18 08/25/2019   CO2 21 (L) 08/25/2019   TSH 2.11 04/29/2015   INR 1.0 08/25/2019   HGBA1C 5.6 07/04/2014    CT Head Wo Contrast  Result Date: 08/25/2019 CLINICAL DATA:  Facial trauma, mechanical fall. EXAM: CT HEAD WITHOUT CONTRAST CT MAXILLOFACIAL WITHOUT CONTRAST CT CERVICAL SPINE WITHOUT CONTRAST TECHNIQUE: Multidetector CT imaging of the head, cervical spine, and maxillofacial structures were performed using the standard protocol without intravenous contrast. Multiplanar CT image reconstructions of the cervical spine and maxillofacial structures were also generated. COMPARISON:  November 27, 2013 FINDINGS: CT HEAD FINDINGS Brain: No evidence of acute infarction, hemorrhage, hydrocephalus, extra-axial collection or mass lesion/mass effect. 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 Vascular: No hyperdense vessel or unexpected calcification. Skull: Normal. Negative for fracture or focal lesion. Other: Facial fractures, see below. CT MAXILLOFACIAL FINDINGS Osseous: Bilateral nasal bone fractures with comminution. Buckling of the anterior nasal septum. No signs of orbital fracture.  Pterygoid plates are intact. Mandible is located. Orbits: Extensive preseptal swelling over the right preseptal swelling also overlying the left orbit. No signs of retro bulbar stranding. No signs of orbital fracture. Sinuses: Paranasal sinuses are clear. There is some blood in the nasal cavity anteriorly. Soft tissues: Extensive preseptal hematoma overlying the right orbit, minimal changes on the left and with some stranding over the nose at the site of nasal bone fractures. CT CERVICAL SPINE FINDINGS Alignment: Mild straightening of normal cervical lordosis likely positional. Mild anterolisthesis of C3 on C4 slightly worse than in 2015, seen in the setting of degenerative changes. Skull base and vertebrae: No acute fracture. No primary bone lesion or focal pathologic process. Soft tissues and spinal canal: No prevertebral fluid or swelling. No visible canal hematoma. Disc levels: Degenerative changes throughout the cervical spine, mild 2 mm anterolisthesis of C3 on C4, near complete loss of the disc space at C4-C5, C5-C6 and C6-C7. Similar changes at C7-T1. Upper chest: Negative. Other: None IMPRESSION: 1. No acute intracranial abnormality. 2. 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 3. Bilateral nasal bone fractures with comminution. Buckling of the nasal septum. 4. Extensive preseptal swelling over the right orbit and mild changes overlying the left orbit. No signs of orbital fracture. 5. No evidence of acute fracture or subluxation of the cervical spine. 6. Multilevel degenerative changes of the cervical spine, most prominent at C4-C5, C5-C6 and C6-C7. Electronically Signed   By: Zetta Bills M.D.   On: 08/25/2019 21:55   CT Cervical Spine Wo Contrast  Result Date: 08/25/2019 CLINICAL DATA:  Facial trauma, mechanical fall. EXAM: CT HEAD WITHOUT CONTRAST CT MAXILLOFACIAL WITHOUT CONTRAST CT CERVICAL SPINE WITHOUT CONTRAST TECHNIQUE: Multidetector CT imaging of the head, cervical spine,  and maxillofacial structures were performed using the standard protocol without intravenous contrast. Multiplanar CT image reconstructions of the cervical spine and maxillofacial structures were also generated. COMPARISON:  November 27, 2013 FINDINGS: CT HEAD FINDINGS Brain: No evidence of acute infarction, hemorrhage, hydrocephalus, extra-axial collection or mass lesion/mass effect. 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 Vascular: No hyperdense vessel  or unexpected calcification. Skull: Normal. Negative for fracture or focal lesion. Other: Facial fractures, see below. CT MAXILLOFACIAL FINDINGS Osseous: Bilateral nasal bone fractures with comminution. Buckling of the anterior nasal septum. No signs of orbital fracture. Pterygoid plates are intact. Mandible is located. Orbits: Extensive preseptal swelling over the right preseptal swelling also overlying the left orbit. No signs of retro bulbar stranding. No signs of orbital fracture. Sinuses: Paranasal sinuses are clear. There is some blood in the nasal cavity anteriorly. Soft tissues: Extensive preseptal hematoma overlying the right orbit, minimal changes on the left and with some stranding over the nose at the site of nasal bone fractures. CT CERVICAL SPINE FINDINGS Alignment: Mild straightening of normal cervical lordosis likely positional. Mild anterolisthesis of C3 on C4 slightly worse than in 2015, seen in the setting of degenerative changes. Skull base and vertebrae: No acute fracture. No primary bone lesion or focal pathologic process. Soft tissues and spinal canal: No prevertebral fluid or swelling. No visible canal hematoma. Disc levels: Degenerative changes throughout the cervical spine, mild 2 mm anterolisthesis of C3 on C4, near complete loss of the disc space at C4-C5, C5-C6 and C6-C7. Similar changes at C7-T1. Upper chest: Negative. Other: None IMPRESSION: 1. No acute intracranial abnormality. 2. Signs of  chronic infarct in the high left parietal region. 3. Bilateral nasal bone fractures with comminution. Buckling of the nasal septum. 4. Extensive preseptal swelling over the right orbit and mild changes overlying the left orbit. No signs of orbital fracture. 5. No evidence of acute fracture or subluxation of the cervical spine. 6. Multilevel degenerative changes of the cervical spine, most prominent at C4-C5, C5-C6 and C6-C7. Electronically Signed   By: Zetta Bills M.D.   On: 08/25/2019 21:55   CT MAXILLOFACIAL WO CONTRAST  Result Date: 08/25/2019 CLINICAL DATA:  Facial trauma, mechanical fall. EXAM: CT HEAD WITHOUT CONTRAST CT MAXILLOFACIAL WITHOUT CONTRAST CT CERVICAL SPINE WITHOUT CONTRAST TECHNIQUE: Multidetector CT imaging of the head, cervical spine, and maxillofacial structures were performed using the standard protocol without intravenous contrast. Multiplanar CT image reconstructions of the cervical spine and maxillofacial structures were also generated. COMPARISON:  November 27, 2013 FINDINGS: CT HEAD FINDINGS Brain: No evidence of acute infarction, hemorrhage, hydrocephalus, extra-axial collection or mass lesion/mass effect. 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 Vascular: No hyperdense vessel or unexpected calcification. Skull: Normal. Negative for fracture or focal lesion. Other: Facial fractures, see below. CT MAXILLOFACIAL FINDINGS Osseous: Bilateral nasal bone fractures with comminution. Buckling of the anterior nasal septum. No signs of orbital fracture. Pterygoid plates are intact. Mandible is located. Orbits: Extensive preseptal swelling over the right preseptal swelling also overlying the left orbit. No signs of retro bulbar stranding. No signs of orbital fracture. Sinuses: Paranasal sinuses are clear. There is some blood in the nasal cavity anteriorly. Soft tissues: Extensive preseptal hematoma overlying the right orbit, minimal changes on the  left and with some stranding over the nose at the site of nasal bone fractures. CT CERVICAL SPINE FINDINGS Alignment: Mild straightening of normal cervical lordosis likely positional. Mild anterolisthesis of C3 on C4 slightly worse than in 2015, seen in the setting of degenerative changes. Skull base and vertebrae: No acute fracture. No primary bone lesion or focal pathologic process. Soft tissues and spinal canal: No prevertebral fluid or swelling. No visible canal hematoma. Disc levels: Degenerative changes throughout the cervical spine, mild 2 mm anterolisthesis of C3 on C4, near complete loss of the disc  space at C4-C5, C5-C6 and C6-C7. Similar changes at C7-T1. Upper chest: Negative. Other: None IMPRESSION: 1. No acute intracranial abnormality. 2. 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 3. Bilateral nasal bone fractures with comminution. Buckling of the nasal septum. 4. Extensive preseptal swelling over the right orbit and mild changes overlying the left orbit. No signs of orbital fracture. 5. No evidence of acute fracture or subluxation of the cervical spine. 6. Multilevel degenerative changes of the cervical spine, most prominent at C4-C5, C5-C6 and C6-C7. Electronically Signed   By: Zetta Bills M.D.   On: 08/25/2019 21:55    Assessment & Plan:   Diagnoses and all orders for this visit:  Facial laceration, initial encounter     No orders of the defined types were placed in this encounter.    Follow-up: No follow-ups on file.  Walker Kehr, MD

## 2019-08-26 NOTE — Assessment & Plan Note (Signed)
LOC- brief, memory loss CoQ10 Rest

## 2019-08-26 NOTE — Patient Instructions (Addendum)
Suture removal in 5 days Ice CoQ10 Hold Eliquis x 1-2 days Aquaphore on sutures Arnica   Concussion, Adult  A concussion is a brain injury from a hard, direct hit (trauma) to your head or body. This direct hit causes the brain to quickly shake back and forth inside the skull. A concussion may also be called a mild traumatic brain injury (TBI). Healing from this injury can take time. What are the causes? This condition is caused by:  A direct hit to your head, such as: ? Running into a player during a game. ? Being hit in a fight. ? Hitting your head on a hard surface.  A quick and sudden movement (jolt) of the head or neck, such as in a car crash. What are the signs or symptoms? The signs of a concussion can be hard to notice. They may be missed by you, family members, and doctors. You may look fine on the outside but may not act or feel normal. Physical symptoms  Headaches.  Being tired (fatigued).  Being dizzy.  Problems with body balance.  Problems seeing or hearing.  Being sensitive to light or noise.  Feeling sick to your stomach (nausea) or throwing up (vomiting).  Not sleeping or eating as you used to.  Loss of feeling (numbness) or tingling in the body.  Seizure. Mental and emotional symptoms  Problems remembering things.  Trouble focusing your mind (concentrating), organizing, or making decisions.  Being slow to think, act, react, speak, or read.  Feeling grouchy (irritable).  Having mood changes.  Feeling worried or nervous (anxious).  Feeling sad (depressed). How is this treated? This condition may be treated by:  Stopping sports or activity if you are injured. If you hit your head or have signs of concussion: ? Do not return to sports or activities the same day. ? Get checked by a doctor before you return to your activities.  Resting your body and your mind.  Being watched carefully, often at home.  Medicines to help with symptoms such  as: ? Feeling sick to your stomach. ? Headaches. ? Problems with sleep.  Avoid taking strong pain medicines (opioids) for a concussion.  Avoiding alcohol and drugs.  Being asked to go to a concussion clinic or a place to help you recover (rehabilitation center). Recovery from a concussion can take time. Return to activities only:  When you are fully healed.  When your doctor says it is safe. Follow these instructions at home: Activity  Limit activities that need a lot of thought or focus, such as: ? Homework or work for your job. ? Watching TV. ? Using the computer or phone. ? Playing memory games and puzzles.  Rest. Rest helps your brain heal. Make sure you: ? Get plenty of sleep. Most adults should get 7-9 hours of sleep each night. ? Rest during the day. Take naps or breaks when you feel tired.  Avoid activity like exercise until your doctor says its safe. Stop any activity that makes symptoms worse.  Do not do activities that could cause a second concussion, such as riding a bike or playing sports.  Ask your doctor when you can return to your normal activities, such as school, work, sports, and driving. Your ability to react may be slower. Do not do these activities if you are dizzy. General instructions   Take over-the-counter and prescription medicines only as told by your doctor.  Do not drink alcohol until your doctor says you can.  Watch  your symptoms and tell other people to do the same. Other problems can occur after a concussion. Older adults have a higher risk of serious problems.  Tell your work Freight forwarder, teachers, Government social research officer, school counselor, coach, or Product/process development scientist about your injury and symptoms. Tell them about what you can or cannot do.  Keep all follow-up visits as told by your doctor. This is important. How is this prevented?  It is very important that you do not get another brain injury. In rare cases, another injury can cause brain damage that  will not go away, brain swelling, or death. The risk of this is greatest in the first 7-10 days after a head injury. To avoid injuries: ? Stop activities that could lead to a second concussion, such as contact sports, until your doctor says it is okay. ? When you return to sports or activities:  Do not crash into other players. This is how most concussions happen.  Follow the rules.  Respect other players. ? Get regular exercise. Do strength and balance training. ? Wear a helmet that fits you well during sports, biking, or other activities.  Helmets can help protect you from serious skull and brain injuries, but they do not protect you from a concussion. Even when wearing a helmet, you should avoid being hit in the head. Contact a doctor if:  Your symptoms get worse or they do not get better.  You have new symptoms.  You have another injury. Get help right away if:  You have bad headaches or your headaches get worse.  You feel weak or numb in any part of your body.  You are mixed up (confused).  Your balance gets worse.  You keep throwing up.  You feel more sleepy than normal.  Your speech is not clear (is slurred).  You cannot recognize people or places.  You have a seizure.  Others have trouble waking you up.  You have behavior changes.  You have changes in how you see (vision).  You pass out (lose consciousness). Summary  A concussion is a brain injury from a hard, direct hit (trauma) to your head or body.  This condition is treated with rest and careful watching of symptoms.  If you keep having symptoms, call your doctor. This information is not intended to replace advice given to you by your health care provider. Make sure you discuss any questions you have with your health care provider. Document Released: 08/15/2009 Document Revised: 04/17/2018 Document Reviewed: 04/17/2018 Elsevier Patient Education  2020 [employer].

## 2019-08-26 NOTE — Assessment & Plan Note (Signed)
On Eliquis

## 2019-08-26 NOTE — Assessment & Plan Note (Signed)
ENT ref Ice Suture removal in 5 days Ice CoQ10 Hold Eliquis x 1-2 days Aquaphore on sutures Arnica

## 2019-08-27 ENCOUNTER — Encounter: Payer: Self-pay | Admitting: Physician Assistant

## 2019-08-27 ENCOUNTER — Ambulatory Visit: Payer: Medicare Other | Admitting: Orthopedic Surgery

## 2019-08-27 VITALS — Ht 64.0 in | Wt 160.0 lb

## 2019-08-27 DIAGNOSIS — L97819 Non-pressure chronic ulcer of other part of right lower leg with unspecified severity: Secondary | ICD-10-CM

## 2019-08-27 DIAGNOSIS — S8001XS Contusion of right knee, sequela: Secondary | ICD-10-CM

## 2019-08-27 DIAGNOSIS — L97919 Non-pressure chronic ulcer of unspecified part of right lower leg with unspecified severity: Secondary | ICD-10-CM | POA: Diagnosis not present

## 2019-08-27 DIAGNOSIS — I87331 Chronic venous hypertension (idiopathic) with ulcer and inflammation of right lower extremity: Secondary | ICD-10-CM | POA: Diagnosis not present

## 2019-08-28 ENCOUNTER — Encounter: Payer: Self-pay | Admitting: Internal Medicine

## 2019-08-28 ENCOUNTER — Encounter: Payer: Self-pay | Admitting: Orthopedic Surgery

## 2019-08-28 DIAGNOSIS — L089 Local infection of the skin and subcutaneous tissue, unspecified: Secondary | ICD-10-CM | POA: Insufficient documentation

## 2019-08-28 NOTE — Progress Notes (Signed)
Office Visit Note   Patient: Hailey [name]           Date of Birth: 26-Feb-1935           MRN: [id number] Visit Date: 08/27/2019              Requested by: Cassandria Anger, MD [place],  [place] [address] PCP: Cassandria Anger, MD  Chief Complaint  Patient presents with  . Right Knee - Pain    Follow up large skin tear DOI 08/25/19      HPI: Patient is a 84 year old woman who states that she fell down 2 steps sustaining blunt trauma to her face arms and right leg.  She had sutures placed for the facial laceration a dry dressing for the large laceration of the right knee.  Patient has been on Eliquis she states that it is on hold now for 2 days.  She complains of bleeding swelling and bruising.  She is status post limb salvage intervention for a traumatic hematoma of the right lower extremity secondary to her Eliquis treatment as well.  Assessment & Plan: Visit Diagnoses:  1. Chronic venous hypertension (idiopathic) with ulcer and inflammation of right lower extremity (HCC)   2. Ulcer of right knee, unspecified ulcer stage ([place])   3. Traumatic hematoma of knee, right, sequela     Plan: We will place her in a Dynaflex compressive wrap that extends above the knee.  Discussed that we will continue with compression wraps until the avulsed skin is stable and then advance her to the medical compression stockings.  Follow-Up Instructions: Return in about 1 week (around 09/03/2019).   Ortho Exam  Patient is alert, oriented, no adenopathy, well-dressed, normal affect, normal respiratory effort. Examination patient has ecchymosis and bruising involving her entire face the skin lacerations are sutured and no breakdown.  Her calf is soft no evidence of a compartment syndrome she has active plantar flexion dorsiflexion of the ankle.  She has a massive apex superiorly tissue avulsion and will most likely completely necrosis the soft tissue flap over the patella tendon  extending up to the patella.  Imaging: No results found.   Labs: Lab Results  Component Value Date   HGBA1C 5.6 07/04/2014   ESRSEDRATE 31 (H) 08/30/2014   ESRSEDRATE 29 (H) 12/18/2010   ESRSEDRATE 39 (H) 06/14/2009   CRP <1.0 08/17/2019   CRP 0.7 08/30/2014   LABURIC 6.1 06/14/2009   LABORGA ESCHERICHIA COLI 02/19/2017     Lab Results  Component Value Date   ALBUMIN 3.3 (L) 08/25/2019   ALBUMIN 4.2 04/16/2019   ALBUMIN 4.0 05/22/2018   LABURIC 6.1 06/14/2009    No results found for: MG Lab Results  Component Value Date   VD25OH 32 12/12/2016    No results found for: PREALBUMIN CBC EXTENDED Latest Ref Rng & Units 08/25/2019 08/17/2019 04/16/2019  WBC 4.0 - 10.5 K/uL 7.4 8.0 6.8  RBC 3.87 - 5.11 MIL/uL 3.35(L) 3.78(L) 3.55(L)  HGB 12.0 - 15.0 g/dL 10.7(L) 12.2 11.6(L)  HCT 36.0 - 46.0 % 33.3(L) 36.9 34.4(L)  PLT 150 - 400 K/uL 215 220.0 203.0  NEUTROABS 1.7 - 7.7 K/uL 5.0 5.6 4.1  LYMPHSABS 0.7 - 4.0 K/uL 1.3 1.4 1.6     Body mass index is 27.46 kg/m.  Orders:  No orders of the defined types were placed in this encounter.  No orders of the defined types were placed in this encounter.    Procedures: No  procedures performed  Clinical Data: No additional findings.  ROS:  All other systems negative, except as noted in the HPI. Review of Systems  Objective: Vital Signs: Ht 5\' 4"  (1.626 m)   Wt 160 lb (72.6 kg)   BMI 27.46 kg/m   Specialty Comments:  No specialty comments available.  PMFS History: Patient Active Problem List   Diagnosis Date Noted  . Leg abrasion, infected, right, subsequent encounter 08/28/2019  . Face lacerations 08/26/2019  . Nose fracture 08/26/2019  . Concussion 08/26/2019  . Wrist pain, acute, right 08/26/2019  . Greater trochanteric pain syndrome of right lower extremity 07/30/2019  . Viral URI with cough 08/15/2018  . Abdominal pain 05/22/2018  . Traumatic hematoma of right knee 01/10/2018  . Ulcer of right knee  ([place])   . Bradycardia with 41-50 beats per minute 12/27/2017  . Right bundle branch block (RBBB) on electrocardiogram (ECG) 12/27/2017  . Quadriceps tendon rupture 12/24/2017  . CKD (chronic kidney disease), stage II 12/24/2017  . Cellulitis of leg, right with large prepatella hematoma and open wounds 12/24/2017  . Iliotibial band syndrome of right side 12/16/2017  . Depression 07/11/2017  . Intervertebral lumbar disc disorder with myelopathy, lumbar region 04/25/2017  . Gastroenteritis 09/13/2016  . Chronic anticoagulation 05/24/2015  . Coronary artery disease involving native coronary artery of native heart with angina pectoris ([place]) 12/20/2014  . Iliotibial band syndrome of left side 11/16/2014  . Diverticulitis of colon 08/30/2014  . PAF (paroxysmal atrial fibrillation) ([place]) 11/21/2012  . Greater trochanteric bursitis of both hips 05/21/2012  . 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 Osteoarthritis of left hip 07/03/2010  . [place] DISEASE, LUMBOSACRAL SPINE 05/18/2010  . SYNCOPE 10/27/2008  . Essential hypertension 06/16/2007  . GERD 06/16/2007  . COLONIC POLYPS, HX OF 06/16/2007   Past Medical History:  Diagnosis Date  . Acute blood loss anemia 12/25/2017  . Allergic rhinitis   . Anxiety   . Arthritis    "my whole spine" (07/01/2017)  . Atrial fibrillation ([place])   . Bowel obstruction ([place])    in [place]  . Bradycardia with 41-50 beats per minute 12/27/2017  . Cellulitis of leg, right with large prepatella hematoma and open wounds 12/26/2017  . CKD (chronic kidney disease) stage 2, GFR 60-89 ml/min   . Colon polyps   . Coronary artery disease    10/18 PCI/DES to p/m LCx with cutting balloon to mLcx  . Diverticulosis of colon   . GERD (gastroesophageal reflux disease)   . Hip bursitis 2010   Dr Para March, Post op seroma  . History of colon polyps   . HSV (herpes simplex virus) anogenital infection 07/2019  . HTN (hypertension)   . IBS (irritable bowel  syndrome)    constipation predominant - Dr Earlean Shawl  . Lichen sclerosus   . Osteopenia 11/2016   T score -2.0 FRAX 15%/4.3%  . PAC (premature atrial contraction)    Symptomatiic  . Right bundle branch block (RBBB) on electrocardiogram (ECG) 12/27/2017  . Scoliosis   . SVT (supraventricular tachycardia) (HCC)    brief history    Family History  Problem Relation Age of Onset  . Colon cancer Mother   . Diabetes Father   . Prostate cancer Father   . Prostate cancer Brother   . Pancreatic cancer Brother   . Stomach cancer Son   . Heart attack Neg Hx   . Stroke Neg Hx     Past Surgical History:  Procedure Laterality Date  .  ANTERIOR AND POSTERIOR VAGINAL REPAIR  01/2002   Archie Endo 01/23/2011  . APPENDECTOMY  1948  . CARDIAC CATHETERIZATION  06/26/2017  . CORONARY ANGIOPLASTY WITH STENT PLACEMENT  07/01/2017  . CORONARY ATHERECTOMY N/A 07/01/2017   Procedure: CORONARY ATHERECTOMY;  Surgeon: Belva Crome, MD;  Location: [facility];  Service: Cardiovascular;  Laterality: N/A;  . CORONARY STENT INTERVENTION N/A 07/01/2017   Procedure: CORONARY STENT INTERVENTION;  Surgeon: Belva Crome, MD;  Location: [facility];  Service: Cardiovascular;  Laterality: N/A;  . HAMMER TOE SURGERY    . HEMORRHOID BANDING    . HIP SURGERY Left 04/2009   hip examination under anesthesia followed by greater trochanteric bursectomy; iliotibial band tenotomy/notes 01/20/2011  . I & D EXTREMITY Right 01/10/2018   Procedure: IRRIGATION AND DEBRIDEMENT RIGHT KNEE, APPLY WOUND VAC;  Surgeon: Newt Minion, MD;  Location: [facility];  Service: Orthopedics;  Laterality: Right;  . KNEE BURSECTOMY Right 04/2009   Archie Endo 01/09/2011  . LEFT HEART CATH AND CORONARY ANGIOGRAPHY N/A 06/26/2017   Procedure: LEFT HEART CATH AND CORONARY ANGIOGRAPHY;  Surgeon: Belva Crome, MD;  Location: [facility];  Service: Cardiovascular;  Laterality: N/A;  . PUBOVAGINAL SLING  01/2002   Archie Endo 01/23/2011  . REDUCTION  MAMMAPLASTY    . TEMPORARY PACEMAKER N/A 07/01/2017   Procedure: TEMPORARY PACEMAKER;  Surgeon: Belva Crome, MD;  Location: [facility];  Service: Cardiovascular;  Laterality: N/A;  . VAGINAL HYSTERECTOMY  01/2002   Vaginal hysterectomy, bilateral salpingo-oophorectomy/notes 01/23/2011   Social History   Occupational History  . Occupation: Patent attorney: RETIRED  Tobacco Use  . Smoking status: Former Smoker    Packs/day: 0.25    Years: 28.00    Pack years: 7.00    Types: Cigarettes    Quit date: 1981    Years since quitting: 39.9  . Smokeless tobacco: Never Used  Substance and Sexual Activity  . Alcohol use: Yes    Alcohol/week: 7.0 standard drinks    Types: 7 Standard drinks or equivalent per week    Comment: 7 vodka drinks a week  . Drug use: No  . Sexual activity: Yes    Birth control/protection: Surgical    Comment: 1st intercourse 84 yo--Fewer than 5 partners

## 2019-08-28 NOTE — Assessment & Plan Note (Signed)
Wound care discussed.  See picture

## 2019-08-31 ENCOUNTER — Encounter: Payer: Self-pay | Admitting: Orthopedic Surgery

## 2019-08-31 ENCOUNTER — Ambulatory Visit: Payer: Medicare Other | Admitting: Orthopedic Surgery

## 2019-08-31 ENCOUNTER — Other Ambulatory Visit: Payer: Self-pay

## 2019-08-31 VITALS — Ht 64.0 in | Wt 160.0 lb

## 2019-08-31 DIAGNOSIS — S0993XA Unspecified injury of face, initial encounter: Secondary | ICD-10-CM | POA: Insufficient documentation

## 2019-08-31 DIAGNOSIS — S81801D Unspecified open wound, right lower leg, subsequent encounter: Secondary | ICD-10-CM

## 2019-09-01 ENCOUNTER — Ambulatory Visit: Payer: Medicare Other | Admitting: Internal Medicine

## 2019-09-01 ENCOUNTER — Encounter: Payer: Self-pay | Admitting: Orthopedic Surgery

## 2019-09-01 NOTE — Progress Notes (Signed)
Office Visit Note   Patient: Hailey [name]           Date of Birth: 31-Jul-1935           MRN: [id number] Visit Date: 08/31/2019              Requested by: Cassandria Anger, MD [place],  [place] [address] PCP: Cassandria Anger, MD  Chief Complaint  Patient presents with  . Right Leg - Follow-up  . Right Knee - Follow-up      HPI: Patient is a 84 year old woman who presents status post fall with traumatic skin avulsion inferior to the right knee.  She is just undergone a Dynaflex compression wrap patient complains of shooting pain.  Assessment & Plan: Visit Diagnoses:  1. Wound of right leg, subsequent encounter     Plan: We will follow-up in 1 week to replace the 24M compression wrap.  Follow-up 1 week after that for evaluation for possibly an extra-large Vive wear sock.  Follow-Up Instructions: Return in about 1 week (around 09/07/2019).   Ortho Exam  Patient is alert, oriented, no adenopathy, well-dressed, normal affect, normal respiratory effort. Examination of the skin flap is stable there is still clear serosanguineous drainage but no signs of infection no necrosis of the skin flap.  Imaging: No results found. No images are attached to the encounter.  Labs: Lab Results  Component Value Date   HGBA1C 5.6 07/04/2014   ESRSEDRATE 31 (H) 08/30/2014   ESRSEDRATE 29 (H) 12/18/2010   ESRSEDRATE 39 (H) 06/14/2009   CRP <1.0 08/17/2019   CRP 0.7 08/30/2014   LABURIC 6.1 06/14/2009   LABORGA ESCHERICHIA COLI 02/19/2017     Lab Results  Component Value Date   ALBUMIN 3.3 (L) 08/25/2019   ALBUMIN 4.2 04/16/2019   ALBUMIN 4.0 05/22/2018   LABURIC 6.1 06/14/2009    No results found for: MG Lab Results  Component Value Date   VD25OH 32 12/12/2016    No results found for: PREALBUMIN CBC EXTENDED Latest Ref Rng & Units 08/25/2019 08/17/2019 04/16/2019  WBC 4.0 - 10.5 K/uL 7.4 8.0 6.8  RBC 3.87 - 5.11 MIL/uL 3.35(L) 3.78(L) 3.55(L)  HGB 12.0  - 15.0 g/dL 10.7(L) 12.2 11.6(L)  HCT 36.0 - 46.0 % 33.3(L) 36.9 34.4(L)  PLT 150 - 400 K/uL 215 220.0 203.0  NEUTROABS 1.7 - 7.7 K/uL 5.0 5.6 4.1  LYMPHSABS 0.7 - 4.0 K/uL 1.3 1.4 1.6     Body mass index is 27.46 kg/m.  Orders:  No orders of the defined types were placed in this encounter.  No orders of the defined types were placed in this encounter.    Procedures: No procedures performed  Clinical Data: No additional findings.  ROS:  All other systems negative, except as noted in the HPI. Review of Systems  Objective: Vital Signs: Ht 5\' 4"  (1.626 m)   Wt 160 lb (72.6 kg)   BMI 27.46 kg/m   Specialty Comments:  No specialty comments available.  PMFS History: Patient Active Problem List   Diagnosis Date Noted  . Leg abrasion, infected, right, subsequent encounter 08/28/2019  . Face lacerations 08/26/2019  . Nose fracture 08/26/2019  . Concussion 08/26/2019  . Wrist pain, acute, right 08/26/2019  . Greater trochanteric pain syndrome of right lower extremity 07/30/2019  . Viral URI with cough 08/15/2018  . Abdominal pain 05/22/2018  . Traumatic hematoma of right knee 01/10/2018  . Ulcer of right knee ([place])   .  Bradycardia with 41-50 beats per minute 12/27/2017  . Right bundle branch block (RBBB) on electrocardiogram (ECG) 12/27/2017  . Quadriceps tendon rupture 12/24/2017  . CKD (chronic kidney disease), stage II 12/24/2017  . Cellulitis of leg, right with large prepatella hematoma and open wounds 12/24/2017  . Iliotibial band syndrome of right side 12/16/2017  . Depression 07/11/2017  . Intervertebral lumbar disc disorder with myelopathy, lumbar region 04/25/2017  . Gastroenteritis 09/13/2016  . Chronic anticoagulation 05/24/2015  . Coronary artery disease involving native coronary artery of native heart with angina pectoris ([place]) 12/20/2014  . Iliotibial band syndrome of left side 11/16/2014  . Diverticulitis of colon 08/30/2014  . PAF (paroxysmal  atrial fibrillation) ([place]) 11/21/2012  . Greater trochanteric bursitis of both hips 05/21/2012  . 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 Osteoarthritis of left hip 07/03/2010  . [place] DISEASE, LUMBOSACRAL SPINE 05/18/2010  . SYNCOPE 10/27/2008  . Essential hypertension 06/16/2007  . GERD 06/16/2007  . COLONIC POLYPS, HX OF 06/16/2007   Past Medical History:  Diagnosis Date  . Acute blood loss anemia 12/25/2017  . Allergic rhinitis   . Anxiety   . Arthritis    "my whole spine" (07/01/2017)  . Atrial fibrillation ([place])   . Bowel obstruction ([place])    in [place]  . Bradycardia with 41-50 beats per minute 12/27/2017  . Cellulitis of leg, right with large prepatella hematoma and open wounds 12/26/2017  . CKD (chronic kidney disease) stage 2, GFR 60-89 ml/min   . Colon polyps   . Coronary artery disease    10/18 PCI/DES to p/m LCx with cutting balloon to mLcx  . Diverticulosis of colon   . GERD (gastroesophageal reflux disease)   . Hip bursitis 2010   Dr Para March, Post op seroma  . History of colon polyps   . HSV (herpes simplex virus) anogenital infection 07/2019  . HTN (hypertension)   . IBS (irritable bowel syndrome)    constipation predominant - Dr Earlean Shawl  . Lichen sclerosus   . Osteopenia 11/2016   T score -2.0 FRAX 15%/4.3%  . PAC (premature atrial contraction)    Symptomatiic  . Right bundle branch block (RBBB) on electrocardiogram (ECG) 12/27/2017  . Scoliosis   . SVT (supraventricular tachycardia) (HCC)    brief history    Family History  Problem Relation Age of Onset  . Colon cancer Mother   . Diabetes Father   . Prostate cancer Father   . Prostate cancer Brother   . Pancreatic cancer Brother   . Stomach cancer Son   . Heart attack Neg Hx   . Stroke Neg Hx     Past Surgical History:  Procedure Laterality Date  . ANTERIOR AND POSTERIOR VAGINAL REPAIR  01/2002   Archie Endo 01/23/2011  . APPENDECTOMY  1948  . CARDIAC CATHETERIZATION  06/26/2017  .  CORONARY ANGIOPLASTY WITH STENT PLACEMENT  07/01/2017  . CORONARY ATHERECTOMY N/A 07/01/2017   Procedure: CORONARY ATHERECTOMY;  Surgeon: Belva Crome, MD;  Location: [facility];  Service: Cardiovascular;  Laterality: N/A;  . CORONARY STENT INTERVENTION N/A 07/01/2017   Procedure: CORONARY STENT INTERVENTION;  Surgeon: Belva Crome, MD;  Location: [facility];  Service: Cardiovascular;  Laterality: N/A;  . HAMMER TOE SURGERY    . HEMORRHOID BANDING    . HIP SURGERY Left 04/2009   hip examination under anesthesia followed by greater trochanteric bursectomy; iliotibial band tenotomy/notes 01/20/2011  . I & D EXTREMITY Right 01/10/2018   Procedure:  IRRIGATION AND DEBRIDEMENT RIGHT KNEE, APPLY WOUND VAC;  Surgeon: Newt Minion, MD;  Location: [facility];  Service: Orthopedics;  Laterality: Right;  . KNEE BURSECTOMY Right 04/2009   Archie Endo 01/09/2011  . LEFT HEART CATH AND CORONARY ANGIOGRAPHY N/A 06/26/2017   Procedure: LEFT HEART CATH AND CORONARY ANGIOGRAPHY;  Surgeon: Belva Crome, MD;  Location: [facility];  Service: Cardiovascular;  Laterality: N/A;  . PUBOVAGINAL SLING  01/2002   Archie Endo 01/23/2011  . REDUCTION MAMMAPLASTY    . TEMPORARY PACEMAKER N/A 07/01/2017   Procedure: TEMPORARY PACEMAKER;  Surgeon: Belva Crome, MD;  Location: [facility];  Service: Cardiovascular;  Laterality: N/A;  . VAGINAL HYSTERECTOMY  01/2002   Vaginal hysterectomy, bilateral salpingo-oophorectomy/notes 01/23/2011   Social History   Occupational History  . Occupation: Patent attorney: RETIRED  Tobacco Use  . Smoking status: Former Smoker    Packs/day: 0.25    Years: 28.00    Pack years: 7.00    Types: Cigarettes    Quit date: 1981    Years since quitting: 40.0  . Smokeless tobacco: Never Used  Substance and Sexual Activity  . Alcohol use: Yes    Alcohol/week: 7.0 standard drinks    Types: 7 Standard drinks or equivalent per week    Comment: 7 vodka drinks a  week  . Drug use: No  . Sexual activity: Yes    Birth control/protection: Surgical    Comment: 1st intercourse 84 yo--Fewer than 5 partners

## 2019-09-05 ENCOUNTER — Other Ambulatory Visit: Payer: Self-pay | Admitting: Orthopaedic Surgery

## 2019-09-05 MED ORDER — DOXYCYCLINE HYCLATE 100 MG PO TABS: 100.0000 mg | ORAL_TABLET | Freq: Two times a day (BID) | ORAL | 0 refills | Status: DC

## 2019-09-07 ENCOUNTER — Encounter: Payer: Self-pay | Admitting: Physician Assistant

## 2019-09-07 ENCOUNTER — Ambulatory Visit: Payer: Medicare Other | Admitting: Physician Assistant

## 2019-09-07 ENCOUNTER — Other Ambulatory Visit: Payer: Self-pay

## 2019-09-07 VITALS — Ht 64.0 in | Wt 160.0 lb

## 2019-09-07 DIAGNOSIS — S0993XA Unspecified injury of face, initial encounter: Secondary | ICD-10-CM | POA: Diagnosis not present

## 2019-09-07 DIAGNOSIS — S81801D Unspecified open wound, right lower leg, subsequent encounter: Secondary | ICD-10-CM | POA: Diagnosis not present

## 2019-09-07 NOTE — Progress Notes (Signed)
Office Visit Note   Patient: Hailey [name]           Date of Birth: 82-Jul-1936           MRN: [id number] Visit Date: 09/07/2019              Requested by: Cassandria Anger, MD [place],  [place] [id number] PCP: Cassandria Anger, MD  Chief Complaint  Patient presents with  . Right Leg - Follow-up      HPI: This is a pleasant woman who is following up for Dynaflex wraps after sustaining a injury to her knee.  She did call over the weekend because she detected a foul smell.  She was placed on antibiotics she denies any fever chills  Assessment & Plan: Visit Diagnoses: No diagnosis found.  Plan: She will follow-up later this week possibly switch to a VivE stocking  Follow-Up Instructions: No follow-ups on file.   Ortho Exam  Patient is alert, oriented, no adenopathy, well-dressed, normal affect, normal respiratory effort. Good granulation tissue over the ulcer.  No foul small but the dressing is already been removed no necrosis no induration or surrounding cellulitis Imaging: No results found. No images are attached to the encounter.  Labs: Lab Results  Component Value Date   HGBA1C 5.6 07/04/2014   ESRSEDRATE 31 (H) 08/30/2014   ESRSEDRATE 29 (H) 12/18/2010   ESRSEDRATE 39 (H) 06/14/2009   CRP <1.0 08/17/2019   CRP 0.7 08/30/2014   LABURIC 6.1 06/14/2009   LABORGA ESCHERICHIA COLI 02/19/2017     Lab Results  Component Value Date   ALBUMIN 3.3 (L) 08/25/2019   ALBUMIN 4.2 04/16/2019   ALBUMIN 4.0 05/22/2018   LABURIC 6.1 06/14/2009    No results found for: MG Lab Results  Component Value Date   VD25OH 32 12/12/2016    No results found for: PREALBUMIN CBC EXTENDED Latest Ref Rng & Units 08/25/2019 08/17/2019 04/16/2019  WBC 4.0 - 10.5 K/uL 7.4 8.0 6.8  RBC 3.87 - 5.11 MIL/uL 3.35(L) 3.78(L) 3.55(L)  HGB 12.0 - 15.0 g/dL 10.7(L) 12.2 11.6(L)  HCT 36.0 - 46.0 % 33.3(L) 36.9 34.4(L)  PLT 150 - 400 K/uL 215 220.0 203.0  NEUTROABS 1.7 -  7.7 K/uL 5.0 5.6 4.1  LYMPHSABS 0.7 - 4.0 K/uL 1.3 1.4 1.6     Body mass index is 27.46 kg/m.  Orders:  No orders of the defined types were placed in this encounter.  No orders of the defined types were placed in this encounter.    Procedures: No procedures performed  Clinical Data: No additional findings.  ROS:  All other systems negative, except as noted in the HPI. Review of Systems  Objective: Vital Signs: Ht 5\' 4"  (1.626 m)   Wt 160 lb (72.6 kg)   BMI 27.46 kg/m   Specialty Comments:  No specialty comments available.  PMFS History: Patient Active Problem List   Diagnosis Date Noted  . Leg abrasion, infected, right, subsequent encounter 08/28/2019  . Face lacerations 08/26/2019  . Nose fracture 08/26/2019  . Concussion 08/26/2019  . Wrist pain, acute, right 08/26/2019  . Greater trochanteric pain syndrome of right lower extremity 07/30/2019  . Viral URI with cough 08/15/2018  . Abdominal pain 05/22/2018  . Traumatic hematoma of right knee 01/10/2018  . Ulcer of right knee ([place])   . Bradycardia with 41-50 beats per minute 12/27/2017  . Right bundle branch block (RBBB) on electrocardiogram (ECG) 12/27/2017  . Quadriceps tendon rupture  12/24/2017  . CKD (chronic kidney disease), stage II 12/24/2017  . Cellulitis of leg, right with large prepatella hematoma and open wounds 12/24/2017  . Iliotibial band syndrome of right side 12/16/2017  . Depression 07/11/2017  . Intervertebral lumbar disc disorder with myelopathy, lumbar region 04/25/2017  . Gastroenteritis 09/13/2016  . Chronic anticoagulation 05/24/2015  . Coronary artery disease involving native coronary artery of native heart with angina pectoris ([place]) 12/20/2014  . Iliotibial band syndrome of left side 11/16/2014  . Diverticulitis of colon 08/30/2014  . PAF (paroxysmal atrial fibrillation) ([place]) 11/21/2012  . Greater trochanteric bursitis of both hips 05/21/2012  . 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 Osteoarthritis of left hip 07/03/2010  . [place] DISEASE, LUMBOSACRAL SPINE 05/18/2010  . SYNCOPE 10/27/2008  . Essential hypertension 06/16/2007  . GERD 06/16/2007  . COLONIC POLYPS, HX OF 06/16/2007   Past Medical History:  Diagnosis Date  . Acute blood loss anemia 12/25/2017  . Allergic rhinitis   . Anxiety   . Arthritis    "my whole spine" (07/01/2017)  . Atrial fibrillation ([place])   . Bowel obstruction ([place])    in [place]  . Bradycardia with 41-50 beats per minute 12/27/2017  . Cellulitis of leg, right with large prepatella hematoma and open wounds 12/26/2017  . CKD (chronic kidney disease) stage 2, GFR 60-89 ml/min   . Colon polyps   . Coronary artery disease    10/18 PCI/DES to p/m LCx with cutting balloon to mLcx  . Diverticulosis of colon   . GERD (gastroesophageal reflux disease)   . Hip bursitis 2010   Dr Para March, Post op seroma  . History of colon polyps   . HSV (herpes simplex virus) anogenital infection 07/2019  . HTN (hypertension)   . IBS (irritable bowel syndrome)    constipation predominant - Dr Earlean Shawl  . Lichen sclerosus   . Osteopenia 11/2016   T score -2.0 FRAX 15%/4.3%  . PAC (premature atrial contraction)    Symptomatiic  . Right bundle branch block (RBBB) on electrocardiogram (ECG) 12/27/2017  . Scoliosis   . SVT (supraventricular tachycardia) (H[facility])    brief history    Family History  Problem Relation Age of Onset  . Colon cancer Mother   . Diabetes Father   . Prostate cancer Father   . Prostate cancer Brother   . Pancreatic cancer Brother   . Stomach cancer Son   . Heart attack Neg Hx   . Stroke Neg Hx     Past Surgical History:  Procedure Laterality Date  . ANTERIOR AND POSTERIOR VAGINAL REPAIR  01/2002   Archie Endo 01/23/2011  . APPENDECTOMY  1948  . CARDIAC CATHETERIZATION  06/26/2017  . CORONARY ANGIOPLASTY WITH STENT PLACEMENT  07/01/2017  . CORONARY ATHERECTOMY N/A 07/01/2017   Procedure: CORONARY ATHERECTOMY;   Surgeon: Belva Crome, MD;  Location: [facility];  Service: Cardiovascular;  Laterality: N/A;  . CORONARY STENT INTERVENTION N/A 07/01/2017   Procedure: CORONARY STENT INTERVENTION;  Surgeon: Belva Crome, MD;  Location: [facility];  Service: Cardiovascular;  Laterality: N/A;  . HAMMER TOE SURGERY    . HEMORRHOID BANDING    . HIP SURGERY Left 04/2009   hip examination under anesthesia followed by greater trochanteric bursectomy; iliotibial band tenotomy/notes 01/20/2011  . I & D EXTREMITY Right 01/10/2018   Procedure: IRRIGATION AND DEBRIDEMENT RIGHT KNEE, APPLY WOUND VAC;  Surgeon: Newt Minion, MD;  Location: [facility];  Service: Orthopedics;  Laterality:  Right;  [facility] KNEE BURSECTOMY Right 04/2009   Archie Endo 01/09/2011  . LEFT HEART CATH AND CORONARY ANGIOGRAPHY N/A 06/26/2017   Procedure: LEFT HEART CATH AND CORONARY ANGIOGRAPHY;  Surgeon: Belva Crome, MD;  Location: [facility];  Service: Cardiovascular;  Laterality: N/A;  . PUBOVAGINAL SLING  01/2002   Archie Endo 01/23/2011  . REDUCTION MAMMAPLASTY    . TEMPORARY PACEMAKER N/A 07/01/2017   Procedure: TEMPORARY PACEMAKER;  Surgeon: Belva Crome, MD;  Location: [facility];  Service: Cardiovascular;  Laterality: N/A;  . VAGINAL HYSTERECTOMY  01/2002   Vaginal hysterectomy, bilateral salpingo-oophorectomy/notes 01/23/2011   Social History   Occupational History  . Occupation: Patent attorney: RETIRED  Tobacco Use  . Smoking status: Former Smoker    Packs/day: 0.25    Years: 28.00    Pack years: 7.00    Types: Cigarettes    Quit date: 1981    Years since quitting: 40.0  . Smokeless tobacco: Never Used  Substance and Sexual Activity  . Alcohol use: Yes    Alcohol/week: 7.0 standard drinks    Types: 7 Standard drinks or equivalent per week    Comment: 7 vodka drinks a week  . Drug use: No  . Sexual activity: Yes    Birth control/protection: Surgical    Comment: 1st intercourse 84 yo--Fewer than  5 partners

## 2019-09-10 ENCOUNTER — Encounter: Payer: Self-pay | Admitting: Physician Assistant

## 2019-09-10 ENCOUNTER — Other Ambulatory Visit: Payer: Self-pay

## 2019-09-10 ENCOUNTER — Ambulatory Visit: Payer: Medicare Other | Admitting: Physician Assistant

## 2019-09-10 VITALS — Ht 64.0 in | Wt 160.0 lb

## 2019-09-10 DIAGNOSIS — S81801D Unspecified open wound, right lower leg, subsequent encounter: Secondary | ICD-10-CM | POA: Diagnosis not present

## 2019-09-10 NOTE — Progress Notes (Signed)
Office Visit Note   Patient: RICKIA VERNON           Date of Birth: [birth year]           MRN: [id number] Visit Date: 09/10/2019              Requested by: Cassandria Anger, MD [place],  [place] [id number] PCP: Cassandria Anger, MD  Chief Complaint  Patient presents with  . Right Leg - Follow-up      HPI: Patient has been having Dynaflex wraps for a anterior right shin ulcer  Assessment & Plan: Visit Diagnoses: No diagnosis found.  Plan: She was placed in a pair of xl vive socks. She will wear them 24 /7 with the exception of bathing. Follow up 2 weeks  Follow-Up Instructions: No follow-ups on file.   Ortho Exam  Patient is alert, oriented, no adenopathy, well-dressed, normal affect, normal respiratory effort. Right leg Ulcer almost completely resolved. Minimal swelling small amount 1 cm of superficial granulation tissue still exposed. No cellulitis or foul odor  Imaging: No results found. No images are attached to the encounter.  Labs: Lab Results  Component Value Date   HGBA1C 5.6 07/04/2014   ESRSEDRATE 31 (H) 08/30/2014   ESRSEDRATE 29 (H) 12/18/2010   ESRSEDRATE 39 (H) 06/14/2009   CRP <1.0 08/17/2019   CRP 0.7 08/30/2014   LABURIC 6.1 06/14/2009   LABORGA ESCHERICHIA COLI 02/19/2017     Lab Results  Component Value Date   ALBUMIN 3.3 (L) 08/25/2019   ALBUMIN 4.2 04/16/2019   ALBUMIN 4.0 05/22/2018   LABURIC 6.1 06/14/2009    No results found for: MG Lab Results  Component Value Date   VD25OH 32 12/12/2016    No results found for: PREALBUMIN CBC EXTENDED Latest Ref Rng & Units 08/25/2019 08/17/2019 04/16/2019  WBC 4.0 - 10.5 K/uL 7.4 8.0 6.8  RBC 3.87 - 5.11 MIL/uL 3.35(L) 3.78(L) 3.55(L)  HGB 12.0 - 15.0 g/dL 10.7(L) 12.2 11.6(L)  HCT 36.0 - 46.0 % 33.3(L) 36.9 34.4(L)  PLT 150 - 400 K/uL 215 220.0 203.0  NEUTROABS 1.7 - 7.7 K/uL 5.0 5.6 4.1  LYMPHSABS 0.7 - 4.0 K/uL 1.3 1.4 1.6     Body mass index is 27.46  kg/m.  Orders:  No orders of the defined types were placed in this encounter.  No orders of the defined types were placed in this encounter.    Procedures: No procedures performed  Clinical Data: No additional findings.  ROS:  All other systems negative, except as noted in the HPI. Review of Systems  Objective: Vital Signs: Ht 5\' 4"  (1.626 m)   Wt 160 lb (72.6 kg)   BMI 27.46 kg/m   Specialty Comments:  No specialty comments available.  PMFS History: Patient Active Problem List   Diagnosis Date Noted  . Leg abrasion, infected, right, subsequent encounter 08/28/2019  . Face lacerations 08/26/2019  . Nose fracture 08/26/2019  . Concussion 08/26/2019  . Wrist pain, acute, right 08/26/2019  . Greater trochanteric pain syndrome of right lower extremity 07/30/2019  . Viral URI with cough 08/15/2018  . Abdominal pain 05/22/2018  . Traumatic hematoma of right knee 01/10/2018  . Ulcer of right knee ([place])   . Bradycardia with 41-50 beats per minute 12/27/2017  . Right bundle branch block (RBBB) on electrocardiogram (ECG) 12/27/2017  . Quadriceps tendon rupture 12/24/2017  . CKD (chronic kidney disease), stage II 12/24/2017  . Cellulitis of leg, right with large  prepatella hematoma and open wounds 12/24/2017  . Iliotibial band syndrome of right side 12/16/2017  . Depression 07/11/2017  . Intervertebral lumbar disc disorder with myelopathy, lumbar region 04/25/2017  . Gastroenteritis 09/13/2016  . Chronic anticoagulation 05/24/2015  . Coronary artery disease involving native coronary artery of native heart with angina pectoris ([place]) 12/20/2014  . Iliotibial band syndrome of left side 11/16/2014  . Diverticulitis of colon 08/30/2014  . PAF (paroxysmal atrial fibrillation) ([place]) 11/21/2012  . Greater trochanteric bursitis of both hips 05/21/2012  . 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 Osteoarthritis of left hip 07/03/2010  . [place] DISEASE, LUMBOSACRAL  SPINE 05/18/2010  . SYNCOPE 10/27/2008  . Essential hypertension 06/16/2007  . GERD 06/16/2007  . COLONIC POLYPS, HX OF 06/16/2007   Past Medical History:  Diagnosis Date  . Acute blood loss anemia 12/25/2017  . Allergic rhinitis   . Anxiety   . Arthritis    "my whole spine" (07/01/2017)  . Atrial fibrillation ([place])   . Bowel obstruction ([place])    in [place]  . Bradycardia with 41-50 beats per minute 12/27/2017  . Cellulitis of leg, right with large prepatella hematoma and open wounds 12/26/2017  . CKD (chronic kidney disease) stage 2, GFR 60-89 ml/min   . Colon polyps   . Coronary artery disease    10/18 PCI/DES to p/m LCx with cutting balloon to mLcx  . Diverticulosis of colon   . GERD (gastroesophageal reflux disease)   . Hip bursitis 2010   Dr Para March, Post op seroma  . History of colon polyps   . HSV (herpes simplex virus) anogenital infection 07/2019  . HTN (hypertension)   . IBS (irritable bowel syndrome)    constipation predominant - Dr Earlean Shawl  . Lichen sclerosus   . Osteopenia 11/2016   T score -2.0 FRAX 15%/4.3%  . PAC (premature atrial contraction)    Symptomatiic  . Right bundle branch block (RBBB) on electrocardiogram (ECG) 12/27/2017  . Scoliosis   . SVT (supraventricular tachycardia) ([facility])    brief history    Family History  Problem Relation Age of Onset  . Colon cancer Mother   . Diabetes Father   . Prostate cancer Father   . Prostate cancer Brother   . Pancreatic cancer Brother   . Stomach cancer Son   . Heart attack Neg Hx   . Stroke Neg Hx     Past Surgical History:  Procedure Laterality Date  . ANTERIOR AND POSTERIOR VAGINAL REPAIR  01/2002   Archie Endo 01/23/2011  . APPENDECTOMY  1948  . CARDIAC CATHETERIZATION  06/26/2017  . CORONARY ANGIOPLASTY WITH STENT PLACEMENT  07/01/2017  . CORONARY ATHERECTOMY N/A 07/01/2017   Procedure: CORONARY ATHERECTOMY;  Surgeon: Belva Crome, MD;  Location: [facility];  Service: Cardiovascular;   Laterality: N/A;  . CORONARY STENT INTERVENTION N/A 07/01/2017   Procedure: CORONARY STENT INTERVENTION;  Surgeon: Belva Crome, MD;  Location: [facility];  Service: Cardiovascular;  Laterality: N/A;  . HAMMER TOE SURGERY    . HEMORRHOID BANDING    . HIP SURGERY Left 04/2009   hip examination under anesthesia followed by greater trochanteric bursectomy; iliotibial band tenotomy/notes 01/20/2011  . I & D EXTREMITY Right 01/10/2018   Procedure: IRRIGATION AND DEBRIDEMENT RIGHT KNEE, APPLY WOUND VAC;  Surgeon: Newt Minion, MD;  Location: [facility];  Service: Orthopedics;  Laterality: Right;  . KNEE BURSECTOMY Right 04/2009   Archie Endo 01/09/2011  . LEFT HEART CATH AND CORONARY  ANGIOGRAPHY N/A 06/26/2017   Procedure: LEFT HEART CATH AND CORONARY ANGIOGRAPHY;  Surgeon: Belva Crome, MD;  Location: [facility];  Service: Cardiovascular;  Laterality: N/A;  . PUBOVAGINAL SLING  01/2002   Archie Endo 01/23/2011  . REDUCTION MAMMAPLASTY    . TEMPORARY PACEMAKER N/A 07/01/2017   Procedure: TEMPORARY PACEMAKER;  Surgeon: Belva Crome, MD;  Location: [facility];  Service: Cardiovascular;  Laterality: N/A;  . VAGINAL HYSTERECTOMY  01/2002   Vaginal hysterectomy, bilateral salpingo-oophorectomy/notes 01/23/2011   Social History   Occupational History  . Occupation: Patent attorney: RETIRED  Tobacco Use  . Smoking status: Former Smoker    Packs/day: 0.25    Years: 28.00    Pack years: 7.00    Types: Cigarettes    Quit date: 1981    Years since quitting: 40.0  . Smokeless tobacco: Never Used  Substance and Sexual Activity  . Alcohol use: Yes    Alcohol/week: 7.0 standard drinks    Types: 7 Standard drinks or equivalent per week    Comment: 7 vodka drinks a week  . Drug use: No  . Sexual activity: Yes    Birth control/protection: Surgical    Comment: 1st intercourse 84 yo--Fewer than 5 partners

## 2019-09-15 ENCOUNTER — Ambulatory Visit: Payer: Medicare Other | Admitting: Orthopedic Surgery

## 2019-09-16 ENCOUNTER — Encounter: Payer: Self-pay | Admitting: Internal Medicine

## 2019-09-16 ENCOUNTER — Ambulatory Visit: Payer: Self-pay

## 2019-09-16 ENCOUNTER — Ambulatory Visit (INDEPENDENT_AMBULATORY_CARE_PROVIDER_SITE_OTHER): Payer: Medicare Other | Admitting: Internal Medicine

## 2019-09-16 ENCOUNTER — Other Ambulatory Visit: Payer: Self-pay

## 2019-09-16 DIAGNOSIS — I480 Paroxysmal atrial fibrillation: Secondary | ICD-10-CM | POA: Diagnosis not present

## 2019-09-16 DIAGNOSIS — L97919 Non-pressure chronic ulcer of unspecified part of right lower leg with unspecified severity: Secondary | ICD-10-CM

## 2019-09-16 DIAGNOSIS — R42 Dizziness and giddiness: Secondary | ICD-10-CM

## 2019-09-16 DIAGNOSIS — I87331 Chronic venous hypertension (idiopathic) with ulcer and inflammation of right lower extremity: Secondary | ICD-10-CM

## 2019-09-16 DIAGNOSIS — G903 Multi-system degeneration of the autonomic nervous system: Secondary | ICD-10-CM | POA: Diagnosis not present

## 2019-09-16 DIAGNOSIS — I25119 Atherosclerotic heart disease of native coronary artery with unspecified angina pectoris: Secondary | ICD-10-CM

## 2019-09-16 DIAGNOSIS — S060X1D Concussion with loss of consciousness of 30 minutes or less, subsequent encounter: Secondary | ICD-10-CM

## 2019-09-16 DIAGNOSIS — D681 Hereditary factor XI deficiency: Secondary | ICD-10-CM

## 2019-09-16 MED ORDER — MECLIZINE HCL 12.5 MG PO TABS: 12.5000 mg | ORAL_TABLET | Freq: Three times a day (TID) | ORAL | 1 refills | Status: DC | PRN

## 2019-09-16 NOTE — Patient Instructions (Addendum)
Try trekking poles for walking   Benign Positional Vertigo symptoms Start Meclizine. Start Laruth Bouchard - Daroff exercise several times a day as dirrected.

## 2019-09-16 NOTE — Assessment & Plan Note (Signed)
Post-concussion vertigo, HAs discussed Tylenol prn

## 2019-09-16 NOTE — Telephone Encounter (Signed)
Patient called stating that she has dizziness that she describes as lightheaded/ swimmy headed.  She states this has been going on for 4-5 days.  She states that she has headache and has recently December 15 been seen in ER for a fall and had several stitches to her forehead.  She states she also saw Dr Alain Marion after the ER visit.  She was not Dx with concussion but states that Dr Alain Marion stated that she probably had a concussion. She has had dizziness in the past related to her HR and Afib.  She states HR has been in her normal range.  HR now is 65. She has no other symptoms. Care advice read to patient. She verbalized understanding. Call transferred to office for scheduling. Reason for Disposition . [1] MODERATE dizziness (e.g., interferes with normal activities) AND [2] has NOT been evaluated by physician for this  (Exception: dizziness caused by heat exposure, sudden standing, or poor fluid intake)  Answer Assessment - Initial Assessment Questions 1. DESCRIPTION: "Describe your dizziness."     Swimming headed 2. LIGHTHEADED: "Do you feel lightheaded?" (e.g., somewhat faint, woozy, weak upon standing)     Woozy feel faint 3. VERTIGO: "Do you feel like either you or the room is spinning or tilting?" (i.e. vertigo)    No 4. SEVERITY: "How bad is it?"  "Do you feel like you are going to faint?" "Can you stand and walk?"   - MILD - walking normally   - MODERATE - interferes with normal activities (e.g., work, school)    - SEVERE - unable to stand, requires support to walk, feels like passing out now.     moderate 5. ONSET:  "When did the dizziness begin?"    Last 4-5 days 6. AGGRAVATING FACTORS: "Does anything make it worse?" (e.g., standing, change in head position)     Change in head position 7. HEART RATE: "Can you tell me your heart rate?" "How many beats in 15 seconds?"  (Note: not all patients can do this)       afib HR 65 8. CAUSE: "What do you think is causing the dizziness?"  Fall with head injury Dec 15 9. RECURRENT SYMPTOM: "Have you had dizziness before?" If so, ask: "When was the last time?" "What happened that time?"  When dehydrated and HR low 10. OTHER SYMPTOMS: "Do you have any other symptoms?" (e.g., fever, chest pain, vomiting, diarrhea, bleeding)       no 11. PREGNANCY: "Is there any chance you are pregnant?" "When was your last menstrual period?"       N/A  Protocols used: DIZZINESS [facility]

## 2019-09-16 NOTE — Assessment & Plan Note (Signed)
A relapse of Benign Positional Vertigo symptoms on the right. Start Meclizine. Start Laruth Bouchard - Daroff exercise several times a day as dirrected.

## 2019-09-23 ENCOUNTER — Ambulatory Visit (INDEPENDENT_AMBULATORY_CARE_PROVIDER_SITE_OTHER): Payer: Medicare Other | Admitting: Physician Assistant

## 2019-09-23 ENCOUNTER — Encounter: Payer: Self-pay | Admitting: Physician Assistant

## 2019-09-23 ENCOUNTER — Other Ambulatory Visit: Payer: Self-pay

## 2019-09-23 VITALS — Ht 64.0 in | Wt 151.0 lb

## 2019-09-23 DIAGNOSIS — S8001XS Contusion of right knee, sequela: Secondary | ICD-10-CM | POA: Diagnosis not present

## 2019-09-23 DIAGNOSIS — S81801D Unspecified open wound, right lower leg, subsequent encounter: Secondary | ICD-10-CM

## 2019-09-23 NOTE — Progress Notes (Signed)
Office Visit Note   Patient: Hailey [name]           Date of Birth: 1935-01-09           MRN: [id number] Visit Date: 09/23/2019              Requested by: Cassandria Anger, MD [address] [place],  [place] [address] PCP: Plotnikov, Evie Lacks, MD  Chief Complaint  Patient presents with  . Right Leg - Follow-up      HPI: Patient is an 85 year old woman seen today in follow up for traumatic ulcerations to right anterior shin. Has been in vive sock since last visit. Has no concerns.  Assessment & Plan: Visit Diagnoses:  1. Wound of right leg, subsequent encounter   2. Traumatic hematoma of knee, right, sequela     Plan: continue socks until healed. Patient and I comfortable with her follow up as needed.   Follow-Up Instructions: Return if symptoms worsen or fail to improve.   Ortho Exam  Patient is alert, oriented, no adenopathy, well-dressed, normal affect, normal respiratory effort. Trace edema RLE. Right legulcer almost completely resolved. Proximal ulcer fully healed. Distal shin with one 8 mm in diameter thin eschar. No surrounding erythema or drainage. No cellulitis or foul odor  Imaging: No results found. No images are attached to the encounter.  Labs: Lab Results  Component Value Date   HGBA1C 5.6 07/04/2014   ESRSEDRATE 31 (H) 08/30/2014   ESRSEDRATE 29 (H) 12/18/2010   ESRSEDRATE 39 (H) 06/14/2009   CRP <1.0 08/17/2019   CRP 0.7 08/30/2014   LABURIC 6.1 06/14/2009   LABORGA ESCHERICHIA COLI 02/19/2017     Lab Results  Component Value Date   ALBUMIN 3.3 (L) 08/25/2019   ALBUMIN 4.2 04/16/2019   ALBUMIN 4.0 05/22/2018   LABURIC 6.1 06/14/2009    No results found for: MG Lab Results  Component Value Date   VD25OH 32 12/12/2016    No results found for: PREALBUMIN CBC EXTENDED Latest Ref Rng & Units 08/25/2019 08/17/2019 04/16/2019  WBC 4.0 - 10.5 K/uL 7.4 8.0 6.8  RBC 3.87 - 5.11 MIL/uL 3.35(L) 3.78(L) 3.55(L)  HGB 12.0 - 15.0 g/dL  10.7(L) 12.2 11.6(L)  HCT 36.0 - 46.0 % 33.3(L) 36.9 34.4(L)  PLT 150 - 400 K/uL 215 220.0 203.0  NEUTROABS 1.7 - 7.7 K/uL 5.0 5.6 4.1  LYMPHSABS 0.7 - 4.0 K/uL 1.3 1.4 1.6     Body mass index is 25.92 kg/m.  Orders:  No orders of the defined types were placed in this encounter.  No orders of the defined types were placed in this encounter.    Procedures: No procedures performed  Clinical Data: No additional findings.  ROS:  All other systems negative, except as noted in the HPI. Review of Systems  Constitutional: Negative for chills and fever.  Cardiovascular: Negative for leg swelling.  Skin: Positive for wound. Negative for color change.    Objective: Vital Signs: Ht 5\' 4"  (1.626 m)   Wt 151 lb (68.5 kg)   BMI 25.92 kg/m   Specialty Comments:  No specialty comments available.  PMFS History: Patient Active Problem List   Diagnosis Date Noted  . Neurogenic orthostatic hypotension ([place]) 09/16/2019  . Leg abrasion, infected, right, subsequent encounter 08/28/2019  . Face lacerations 08/26/2019  . Nose fracture 08/26/2019  . Concussion 08/26/2019  . Wrist pain, acute, right 08/26/2019  . Greater trochanteric pain syndrome of right lower extremity 07/30/2019  .  Viral URI with cough 08/15/2018  . Abdominal pain 05/22/2018  . Traumatic hematoma of right knee 01/10/2018  . Ulcer of right knee ([place])   . Bradycardia with 41-50 beats per minute 12/27/2017  . Right bundle branch block (RBBB) on electrocardiogram (ECG) 12/27/2017  . Quadriceps tendon rupture 12/24/2017  . CKD (chronic kidney disease), stage II 12/24/2017  . Cellulitis of leg, right with large prepatella hematoma and open wounds 12/24/2017  . Iliotibial band syndrome of right side 12/16/2017  . Depression 07/11/2017  . Intervertebral lumbar disc disorder with myelopathy, lumbar region 04/25/2017  . Gastroenteritis 09/13/2016  . Chronic anticoagulation 05/24/2015  . Coronary artery disease  involving native coronary artery of native heart with angina pectoris ([place]) 12/20/2014  . Iliotibial band syndrome of left side 11/16/2014  . Diverticulitis of colon 08/30/2014  . PAF (paroxysmal atrial fibrillation) ([place]) 11/21/2012  . Greater trochanteric bursitis of both hips 05/21/2012  . Dizzinesses 11/07/2011  . 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 Osteoarthritis of left hip 07/03/2010  . [place] DISEASE, LUMBOSACRAL SPINE 05/18/2010  . SYNCOPE 10/27/2008  . Essential hypertension 06/16/2007  . GERD 06/16/2007  . COLONIC POLYPS, HX OF 06/16/2007   Past Medical History:  Diagnosis Date  . Acute blood loss anemia 12/25/2017  . Allergic rhinitis   . Anxiety   . Arthritis    "my whole spine" (07/01/2017)  . Atrial fibrillation ([place])   . Bowel obstruction ([place])    in [place]  . Bradycardia with 41-50 beats per minute 12/27/2017  . Cellulitis of leg, right with large prepatella hematoma and open wounds 12/26/2017  . CKD (chronic kidney disease) stage 2, GFR 60-89 ml/min   . Colon polyps   . Coronary artery disease    10/18 PCI/DES to p/m LCx with cutting balloon to mLcx  . Diverticulosis of colon   . GERD (gastroesophageal reflux disease)   . Hip bursitis 2010   Dr Para March, Post op seroma  . History of colon polyps   . HSV (herpes simplex virus) anogenital infection 07/2019  . HTN (hypertension)   . IBS (irritable bowel syndrome)    constipation predominant - Dr Earlean Shawl  . Lichen sclerosus   . Osteopenia 11/2016   T score -2.0 FRAX 15%/4.3%  . PAC (premature atrial contraction)    Symptomatiic  . Right bundle branch block (RBBB) on electrocardiogram (ECG) 12/27/2017  . Scoliosis   . SVT (supraventricular tachycardia) ([facility])    brief history    Family History  Problem Relation Age of Onset  . Colon cancer Mother   . Diabetes Father   . Prostate cancer Father   . Prostate cancer Brother   . Pancreatic cancer Brother   . Stomach cancer Son   . Heart attack  Neg Hx   . Stroke Neg Hx     Past Surgical History:  Procedure Laterality Date  . ANTERIOR AND POSTERIOR VAGINAL REPAIR  01/2002   Archie Endo 01/23/2011  . APPENDECTOMY  1948  . CARDIAC CATHETERIZATION  06/26/2017  . CORONARY ANGIOPLASTY WITH STENT PLACEMENT  07/01/2017  . CORONARY ATHERECTOMY N/A 07/01/2017   Procedure: CORONARY ATHERECTOMY;  Surgeon: Belva Crome, MD;  Location: [facility];  Service: Cardiovascular;  Laterality: N/A;  . CORONARY STENT INTERVENTION N/A 07/01/2017   Procedure: CORONARY STENT INTERVENTION;  Surgeon: Belva Crome, MD;  Location: [facility];  Service: Cardiovascular;  Laterality: N/A;  . HAMMER TOE SURGERY    . HEMORRHOID BANDING    .  HIP SURGERY Left 04/2009   hip examination under anesthesia followed by greater trochanteric bursectomy; iliotibial band tenotomy/notes 01/20/2011  . I & D EXTREMITY Right 01/10/2018   Procedure: IRRIGATION AND DEBRIDEMENT RIGHT KNEE, APPLY WOUND VAC;  Surgeon: Newt Minion, MD;  Location: [facility];  Service: Orthopedics;  Laterality: Right;  . KNEE BURSECTOMY Right 04/2009   Archie Endo 01/09/2011  . LEFT HEART CATH AND CORONARY ANGIOGRAPHY N/A 06/26/2017   Procedure: LEFT HEART CATH AND CORONARY ANGIOGRAPHY;  Surgeon: Belva Crome, MD;  Location: [facility];  Service: Cardiovascular;  Laterality: N/A;  . PUBOVAGINAL SLING  01/2002   Archie Endo 01/23/2011  . REDUCTION MAMMAPLASTY    . TEMPORARY PACEMAKER N/A 07/01/2017   Procedure: TEMPORARY PACEMAKER;  Surgeon: Belva Crome, MD;  Location: [facility];  Service: Cardiovascular;  Laterality: N/A;  . VAGINAL HYSTERECTOMY  01/2002   Vaginal hysterectomy, bilateral salpingo-oophorectomy/notes 01/23/2011   Social History   Occupational History  . Occupation: Patent attorney: RETIRED  Tobacco Use  . Smoking status: Former Smoker    Packs/day: 0.25    Years: 28.00    Pack years: 7.00    Types: Cigarettes    Quit date: 1981    Years since  quitting: 40.0  . Smokeless tobacco: Never Used  Substance and Sexual Activity  . Alcohol use: Yes    Alcohol/week: 7.0 standard drinks    Types: 7 Standard drinks or equivalent per week    Comment: 7 vodka drinks a week  . Drug use: No  . Sexual activity: Yes    Birth control/protection: Surgical    Comment: 1st intercourse 85 yo--Fewer than 5 partners

## 2019-09-24 ENCOUNTER — Ambulatory Visit: Payer: Medicare Other | Admitting: Physician Assistant

## 2019-09-25 ENCOUNTER — Other Ambulatory Visit: Payer: Self-pay | Admitting: Internal Medicine

## 2019-09-29 ENCOUNTER — Other Ambulatory Visit: Payer: Self-pay

## 2019-09-29 ENCOUNTER — Encounter: Payer: Self-pay | Admitting: Sports Medicine

## 2019-09-29 ENCOUNTER — Ambulatory Visit: Payer: Medicare Other | Admitting: Sports Medicine

## 2019-09-29 VITALS — BP 147/84 | Ht 64.0 in | Wt 155.0 lb

## 2019-09-29 DIAGNOSIS — M7062 Trochanteric bursitis, left hip: Secondary | ICD-10-CM

## 2019-09-29 MED ORDER — METHYLPREDNISOLONE ACETATE 80 MG/ML IJ SUSP
80.0000 mg | Freq: Once | INTRAMUSCULAR | Status: AC
  Administered 2019-09-29: 80 mg via INTRA_ARTICULAR

## 2019-09-29 NOTE — Progress Notes (Signed)
PCP: Plotnikov, Evie Lacks, MD  Subjective:   HPI: Patient is a 85 y.o. female here for evaluation of left hip pain.  Patient notes pain is been present for the last month or 2.  She denies any specific injury or trauma that preceded her pain.  Pain is located lateral aspect of her hip and radiates slightly down her leg.  Patient denies any numbness or tingling.  She denies any bruising or swelling of her hip.  Patient notes pain with lying on her side at night as well as with movement of her hip.  She denies any pain in her groin.  She does have some chronic pain in her buttocks and low back however this is unchanged from her baseline.  Patient had the same problem on her right hip several months ago.  She had a corticosteroid injection as well as a home exercise program done to treat this.  She notes is completely resolved her pain.  Of note patient did have a fall 1 month ago.  It was deemed a mechanical fall.  Patient notes she has been getting occasional vertigo leading to problems with balance and gait.   Review of Systems: See HPI above.  Past Medical History:  Diagnosis Date  . Acute blood loss anemia 12/25/2017  . Allergic rhinitis   . Anxiety   . Arthritis    "my whole spine" (07/01/2017)  . Atrial fibrillation ([place])   . Bowel obstruction ([place])    in [place]  . Bradycardia with 41-50 beats per minute 12/27/2017  . Cellulitis of leg, right with large prepatella hematoma and open wounds 12/26/2017  . CKD (chronic kidney disease) stage 2, GFR 60-89 ml/min   . Colon polyps   . Coronary artery disease    10/18 PCI/DES to p/m LCx with cutting balloon to mLcx  . Diverticulosis of colon   . GERD (gastroesophageal reflux disease)   . Hip bursitis 2010   Dr Para March, Post op seroma  . History of colon polyps   . HSV (herpes simplex virus) anogenital infection 07/2019  . HTN (hypertension)   . IBS (irritable bowel syndrome)    constipation predominant - Dr Earlean Shawl  . Lichen sclerosus   .  Osteopenia 11/2016   T score -2.0 FRAX 15%/4.3%  . PAC (premature atrial contraction)    Symptomatiic  . Right bundle branch block (RBBB) on electrocardiogram (ECG) 12/27/2017  . Scoliosis   . SVT (supraventricular tachycardia) ([place])    brief history    Current Outpatient Medications on File Prior to Visit  Medication Sig Dispense Refill  . acetaminophen (TYLENOL) 500 MG tablet Take 1,000 mg by mouth every 6 (six) hours as needed (Coated or capsules).    Marland Kitchen apixaban (ELIQUIS) 2.5 MG TABS tablet Take 1 tablet (2.5 mg total) by mouth 2 (two) times daily. 90 tablet 3  . atorvastatin (LIPITOR) 80 MG tablet TAKE 1 TABLET AT 6PM. 90 tablet 3  . cetirizine (ZYRTEC) 10 MG tablet Take 10 mg by mouth daily.    . clobetasol cream (TEMOVATE) 0.05 % APPLY NIGHTLY AS DIRECTED. 30 g 1  . Coenzyme Q10 (COQ10) 200 MG CAPS One a day 100 capsule 0  . doxycycline (VIBRA-TABS) 100 MG tablet Take 1 tablet (100 mg total) by mouth 2 (two) times daily. 20 tablet 0  . DULoxetine (CYMBALTA) 60 MG capsule TAKE 1 CAPSULE DAILY. 90 capsule 1  . esomeprazole (NEXIUM) 40 MG capsule Take 1 capsule (40 mg total) by mouth daily. Panacea  capsule 3  . hyoscyamine (LEVSIN) 0.125 MG tablet Take 1-2 tablets (0.125-0.25 mg total) by mouth every 4 (four) hours as needed for up to 10 days for cramping. 100 tablet 3  . LORazepam (ATIVAN) 1 MG tablet Take 1 tablet (1 mg total) by mouth 2 (two) times daily as needed for anxiety or sleep. 180 tablet 1  . meclizine (ANTIVERT) 12.5 MG tablet Take 1 tablet (12.5 mg total) by mouth 3 (three) times daily as needed for dizziness. 60 tablet 1  . metoprolol succinate (TOPROL-XL) 25 MG 24 hr tablet TAKE 3 TABLETS DAILY WITH OR IMMEDIATELY FOLLOWING A MEAL. 270 tablet 3  . nitroGLYCERIN (NITROSTAT) 0.4 MG SL tablet Place 0.4 mg under the tongue every 5 (five) minutes as needed for chest pain (Call 911 at 3rd dose within 15 minutes.).    Marland Kitchen NONFORMULARY OR COMPOUNDED ITEM Estradiol 0.02% vag cream  prefilled applicators  S: insert appful (1 gram) intravaginally twice weekly. 24 each 3  . Probiotic Product (ALIGN PO) Take 1 capsule by mouth daily.    . sodium chloride (OCEAN) 0.65 % SOLN nasal spray Place 1 spray into both nostrils as needed for congestion.    Marland Kitchen terconazole (TERAZOL 7) 0.4 % vaginal cream Place 1 applicator vaginally at bedtime. 45 g 0  . valACYclovir (VALTREX) 500 MG tablet Take one tablet by mouth twice daily for 5 days for early onset symptoms. 30 tablet 0   No current facility-administered medications on file prior to visit.    Past Surgical History:  Procedure Laterality Date  . ANTERIOR AND POSTERIOR VAGINAL REPAIR  01/2002   Archie Endo 01/23/2011  . APPENDECTOMY  1948  . CARDIAC CATHETERIZATION  06/26/2017  . CORONARY ANGIOPLASTY WITH STENT PLACEMENT  07/01/2017  . CORONARY ATHERECTOMY N/A 07/01/2017   Procedure: CORONARY ATHERECTOMY;  Surgeon: Belva Crome, MD;  Location: [facility];  Service: Cardiovascular;  Laterality: N/A;  . CORONARY STENT INTERVENTION N/A 07/01/2017   Procedure: CORONARY STENT INTERVENTION;  Surgeon: Belva Crome, MD;  Location: [facility];  Service: Cardiovascular;  Laterality: N/A;  . HAMMER TOE SURGERY    . HEMORRHOID BANDING    . HIP SURGERY Left 04/2009   hip examination under anesthesia followed by greater trochanteric bursectomy; iliotibial band tenotomy/notes 01/20/2011  . I & D EXTREMITY Right 01/10/2018   Procedure: IRRIGATION AND DEBRIDEMENT RIGHT KNEE, APPLY WOUND VAC;  Surgeon: Newt Minion, MD;  Location: [facility];  Service: Orthopedics;  Laterality: Right;  . KNEE BURSECTOMY Right 04/2009   Archie Endo 01/09/2011  . LEFT HEART CATH AND CORONARY ANGIOGRAPHY N/A 06/26/2017   Procedure: LEFT HEART CATH AND CORONARY ANGIOGRAPHY;  Surgeon: Belva Crome, MD;  Location: [facility];  Service: Cardiovascular;  Laterality: N/A;  . PUBOVAGINAL SLING  01/2002   Archie Endo 01/23/2011  . REDUCTION MAMMAPLASTY    .  TEMPORARY PACEMAKER N/A 07/01/2017   Procedure: TEMPORARY PACEMAKER;  Surgeon: Belva Crome, MD;  Location: [facility];  Service: Cardiovascular;  Laterality: N/A;  . VAGINAL HYSTERECTOMY  01/2002   Vaginal hysterectomy, bilateral salpingo-oophorectomy/notes 01/23/2011    Allergies  Allergen Reactions  . Macrobid [employer] Macro] Other (See Comments)    Nausea, stomach cramps, fatigue , headache.  . Meloxicam Other (See Comments)    Jittery and headache  . Digoxin And Related     headaches  . Mobic [Meloxicam]   . Sulfamethoxazole-Trimethoprim Nausea Only    Social History   Socioeconomic  History  . Marital status: Married    Spouse name: Not on file  . Number of children: 2  . Years of education: Not on file  . Highest education level: Not on file  Occupational History  . Occupation: Patent attorney: RETIRED  Tobacco Use  . Smoking status: Former Smoker    Packs/day: 0.25    Years: 28.00    Pack years: 7.00    Types: Cigarettes    Quit date: 1981    Years since quitting: 40.0  . Smokeless tobacco: Never Used  Substance and Sexual Activity  . Alcohol use: Yes    Alcohol/week: 7.0 standard drinks    Types: 7 Standard drinks or equivalent per week    Comment: 7 vodka drinks a week  . Drug use: No  . Sexual activity: Yes    Birth control/protection: Surgical    Comment: 1st intercourse 85 yo--Fewer than 5 partners  Other Topics Concern  . Not on file  Social History Narrative   Regular Exercise -  YES         Social Determinants of Health   Financial Resource Strain:   . Difficulty of Paying Living Expenses: Not on file  Food Insecurity:   . Worried About Charity fundraiser in the Last Year: Not on file  . Ran Out of Food in the Last Year: Not on file  Transportation Needs:   . Lack of Transportation (Medical): Not on file  . Lack of Transportation (Non-Medical): Not on file  Physical Activity:   . Days of Exercise per  Week: Not on file  . Minutes of Exercise per Session: Not on file  Stress:   . Feeling of Stress : Not on file  Social Connections:   . Frequency of Communication with Friends and Family: Not on file  . Frequency of Social Gatherings with Friends and Family: Not on file  . Attends Religious Services: Not on file  . Active Member of Clubs or Organizations: Not on file  . Attends Archivist Meetings: Not on file  . Marital Status: Not on file  Intimate Partner Violence:   . Fear of Current or Ex-Partner: Not on file  . Emotionally Abused: Not on file  . Physically Abused: Not on file  . Sexually Abused: Not on file    Family History  Problem Relation Age of Onset  . Colon cancer Mother   . Diabetes Father   . Prostate cancer Father   . Prostate cancer Brother   . Pancreatic cancer Brother   . Stomach cancer Son   . Heart attack Neg Hx   . Stroke Neg Hx         Objective:  Physical Exam: There were no vitals taken for this visit. Gen: NAD, comfortable in exam room Lungs: Breathing comfortably on room air Hip Exam Left -Inspection: No deformity, no discoloration -Palpation: Tenderness palpation of the greater trochanter -ROM: Normal ROM with flexion, extension, abduction, internal rotation, external rotation -Strength: Flexion: 4/5; Abduction: 4/5 -Special Tests:FABER: Negative; FADIR: Negative; Log roll: Negative -Limb neurovascularly intact    Assessment & Plan:  Patient is a 85 y.o. female here for evaluation of left hip pain  1.  Left greater trochanteric bursitis -Consent was obtained for corticosteroid injection of her left greater trochanter bursa.  Timeout was performed prior to the injection.  80 mg Depo-Medrol 4 cc of lidocaine were injected using sterile technique into the bursa.  Patient tolerated  the injection well there are no complications -Patient was given a home exercise program for hip abductor strengthening.  This is modified for her to  minimize her fall risk. -Patient was offered referral to formal physical therapy however she would like to wait until she gets her second Covid vaccine.  She got her first vaccine 1 week ago and has her second 1 scheduled for the beginning of February. -We will schedule a video follow-up with patient after she gets her second Covid vaccine dose in the early part of February.  At that time we will consider referral to formal physical therapy both for her hip as well as generalized conditioning to help prevent falls  Patient seen and evaluated with the sports medicine fellow.  I agree with the above plan of care.  I personally administered the cortisone injection into the left greater trochanteric bursal area.  Patient tolerated this without difficulty.  I do think she would benefit greatly from formal physical therapy, especially to work on balance.  She would like to wait until she receives her second Covid vaccine which I think is reasonable.  I have asked her to contact me after that so that I can set up formal PT.

## 2019-09-29 NOTE — Patient Instructions (Signed)
Your hip pain is caused by greater trochanteric bursitis. -The injection given to you today will take several days to have an effect.  You may use ice on the hip as needed until the steroid improves your pain - Home exercises shown to you at today's visit -Once you get your second Covid vaccine we will schedule a video follow-up to see how you are doing with your exercise.  If you are still having pain in your hip without improvement we can consider sending you to formal physical therapy after your second Covid vaccine injection

## 2019-10-05 ENCOUNTER — Ambulatory Visit: Payer: Medicare Other

## 2019-10-05 NOTE — Progress Notes (Addendum)
Cardiology Office Note:    Date:  11/08/2019   ID:  Hailey [name], DOB 17-Feb-1935, MRN [id number]  PCP:  Cassandria Anger, MD  Cardiologist:  Sinclair Grooms, MD   Referring MD: Cassandria Anger, MD   Chief Complaint  Patient presents with  . Atrial Fibrillation    History of Present Illness:    Hailey [name] is a 85 y.o. female with a hx of PSVT, paroxysmal atrial fibrillationonchronic anticoagulation,CAD with PCI DES of RCA 2018,and hypertension.Abnormal prior myocardial perfusion imaging 2017.  She had another fall in December.  She suffered a concussion.  She remembers nothing about her status prior to the fall.  She was walking to her living room and the next thing she knew, she was waking up in an ambulance headed to [facility].  She has not been having  Past Medical History:  Diagnosis Date  . Acute blood loss anemia 12/25/2017  . Allergic rhinitis   . Anxiety   . Arthritis    "my whole spine" (07/01/2017)  . Atrial fibrillation ([place])   . Bowel obstruction ([place])    in [place]  . Bradycardia with 41-50 beats per minute 12/27/2017  . Cellulitis of leg, right with large prepatella hematoma and open wounds 12/26/2017  . CKD (chronic kidney disease) stage 2, GFR 60-89 ml/min   . Colon polyps   . Coronary artery disease    10/18 PCI/DES to p/m LCx with cutting balloon to mLcx  . Diverticulosis of colon   . GERD (gastroesophageal reflux disease)   . Hip bursitis 2010   Dr Para March, Post op seroma  . History of colon polyps   . HSV (herpes simplex virus) anogenital infection 07/2019  . HTN (hypertension)   . IBS (irritable bowel syndrome)    constipation predominant - Dr Earlean Shawl  . Lichen sclerosus   . Osteopenia 11/2016   T score -2.0 FRAX 15%/4.3%  . PAC (premature atrial contraction)    Symptomatiic  . Right bundle branch block (RBBB) on electrocardiogram (ECG) 12/27/2017  . Scoliosis   . SVT (supraventricular tachycardia) ([place])    brief  history    Past Surgical History:  Procedure Laterality Date  . ANTERIOR AND POSTERIOR VAGINAL REPAIR  01/2002   Archie Endo 01/23/2011  . APPENDECTOMY  1948  . CARDIAC CATHETERIZATION  06/26/2017  . CORONARY ANGIOPLASTY WITH STENT PLACEMENT  07/01/2017  . CORONARY ATHERECTOMY N/A 07/01/2017   Procedure: CORONARY ATHERECTOMY;  Surgeon: Belva Crome, MD;  Location: [place] [facility];  Service: Cardiovascular;  Laterality: N/A;  . CORONARY STENT INTERVENTION N/A 07/01/2017   Procedure: CORONARY STENT INTERVENTION;  Surgeon: Belva Crome, MD;  Location: [place] [facility];  Service: Cardiovascular;  Laterality: N/A;  . HAMMER TOE SURGERY    . HEMORRHOID BANDING    . HIP SURGERY Left 04/2009   hip examination under anesthesia followed by greater trochanteric bursectomy; iliotibial band tenotomy/notes 01/20/2011  . I & D EXTREMITY Right 01/10/2018   Procedure: IRRIGATION AND DEBRIDEMENT RIGHT KNEE, APPLY WOUND VAC;  Surgeon: Newt Minion, MD;  Location: [place];  Service: Orthopedics;  Laterality: Right;  . KNEE BURSECTOMY Right 04/2009   Archie Endo 01/09/2011  . LEFT HEART CATH AND CORONARY ANGIOGRAPHY N/A 06/26/2017   Procedure: LEFT HEART CATH AND CORONARY ANGIOGRAPHY;  Surgeon: Belva Crome, MD;  Location: [place] [facility];  Service: Cardiovascular;  Laterality: N/A;  . PUBOVAGINAL SLING  01/2002   Archie Endo 01/23/2011  .  REDUCTION MAMMAPLASTY    . TEMPORARY PACEMAKER N/A 07/01/2017   Procedure: TEMPORARY PACEMAKER;  Surgeon: Belva Crome, MD;  Location: [place] [facility];  Service: Cardiovascular;  Laterality: N/A;  . VAGINAL HYSTERECTOMY  01/2002   Vaginal hysterectomy, bilateral salpingo-oophorectomy/notes 01/23/2011    Current Medications: Current Meds  Medication Sig  . acetaminophen (TYLENOL) 500 MG tablet Take 1,000 mg by mouth every 6 (six) hours as needed (Coated or capsules).  Marland Kitchen apixaban (ELIQUIS) 2.5 MG TABS tablet Take 1 tablet (2.5 mg total) by mouth 2 (two) times daily.  Marland Kitchen  atorvastatin (LIPITOR) 80 MG tablet TAKE 1 TABLET AT 6PM.  . cetirizine (ZYRTEC) 10 MG tablet Take 10 mg by mouth daily.  . clobetasol cream (TEMOVATE) 0.05 % APPLY NIGHTLY AS DIRECTED.  . Coenzyme Q10 (COQ10) 200 MG CAPS One a day  . DULoxetine (CYMBALTA) 60 MG capsule TAKE 1 CAPSULE DAILY.  Marland Kitchen esomeprazole (NEXIUM) 40 MG capsule Take 1 capsule (40 mg total) by mouth daily.  . hyoscyamine (LEVSIN) 0.125 MG tablet Take 1-2 tablets (0.125-0.25 mg total) by mouth every 4 (four) hours as needed for up to 10 days for cramping.  Marland Kitchen LORazepam (ATIVAN) 1 MG tablet Take 1 tablet (1 mg total) by mouth 2 (two) times daily as needed for anxiety or sleep.  . metoprolol succinate (TOPROL-XL) 25 MG 24 hr tablet TAKE 3 TABLETS DAILY WITH OR IMMEDIATELY FOLLOWING A MEAL.  . nitroGLYCERIN (NITROSTAT) 0.4 MG SL tablet Place 0.4 mg under the tongue every 5 (five) minutes as needed for chest pain (Call 911 at 3rd dose within 15 minutes.).  Marland Kitchen NONFORMULARY OR COMPOUNDED ITEM Estradiol 0.02% vag cream prefilled applicators  S: insert appful (1 gram) intravaginally twice weekly.  . Probiotic Product (ALIGN PO) Take 1 capsule by mouth daily.  . sodium chloride (OCEAN) 0.65 % SOLN nasal spray Place 1 spray into both nostrils as needed for congestion.  Marland Kitchen terconazole (TERAZOL 7) 0.4 % vaginal cream Place 1 applicator vaginally at bedtime.  . [DISCONTINUED] valACYclovir (VALTREX) 500 MG tablet Take one tablet by mouth twice daily for 5 days for early onset symptoms.     Allergies:   Macrobid [nitrofurantoin monohyd macro], Meloxicam, Digoxin and related, Mobic [meloxicam], and Sulfamethoxazole-trimethoprim   Social History   Socioeconomic History  . Marital status: Married    Spouse name: Not on file  . Number of children: 2  . Years of education: Not on file  . Highest education level: Not on file  Occupational History  . Occupation: Patent attorney: RETIRED  Tobacco Use  . Smoking status: Former Smoker      Packs/day: 0.25    Years: 28.00    Pack years: 7.00    Types: Cigarettes    Quit date: 1981    Years since quitting: 40.1  . Smokeless tobacco: Never Used  Substance and Sexual Activity  . Alcohol use: Yes    Alcohol/week: 7.0 standard drinks    Types: 7 Standard drinks or equivalent per week    Comment: 7 vodka drinks a week  . Drug use: No  . Sexual activity: Yes    Birth control/protection: Surgical    Comment: 1st intercourse 85 yo--Fewer than 5 partners  Other Topics Concern  . Not on file  Social History Narrative   Regular Exercise -  YES         Social Determinants of Health   Financial Resource Strain:   . Difficulty of Paying Living  Expenses: Not on file  Food Insecurity:   . Worried About Charity fundraiser in the Last Year: Not on file  . Ran Out of Food in the Last Year: Not on file  Transportation Needs:   . Lack of Transportation (Medical): Not on file  . Lack of Transportation (Non-Medical): Not on file  Physical Activity:   . Days of Exercise per Week: Not on file  . Minutes of Exercise per Session: Not on file  Stress:   . Feeling of Stress : Not on file  Social Connections:   . Frequency of Communication with Friends and Family: Not on file  . Frequency of Social Gatherings with Friends and Family: Not on file  . Attends Religious Services: Not on file  . Active Member of Clubs or Organizations: Not on file  . Attends Archivist Meetings: Not on file  . Marital Status: Not on file     Family History: The patient's family history includes Colon cancer in her mother; Diabetes in her father; Pancreatic cancer in her brother; Prostate cancer in her brother and father; Stomach cancer in her son. There is no history of Heart attack, Stroke, or Esophageal cancer.  ROS:   Please see the history of present illness.    Does not know if she is fainting or simply having missteps that cause her to fall. All other systems reviewed and are  negative.  EKGs/Labs/Other Studies Reviewed:    The following studies were reviewed today: No new imaging or functional data  EKG:  EKG atrial fibrillation with controlled ventricular response.  Rate 62 bpm.  Right bundle branch block with left axis deviation is noted.  When compared to January 2020, no change has occurred.  Recent Labs: 08/25/2019: ALT 9; BUN 18; Creatinine, Ser 1.06; Hemoglobin 10.7; Platelets 215; Potassium 4.1; Sodium 133  Recent Lipid Panel    Component Value Date/Time   CHOL 171 11/26/2016 0844   TRIG 99 11/26/2016 0844   TRIG 183 (H) 07/26/2006 0903   HDL 90 11/26/2016 0844   CHOLHDL 1.9 11/26/2016 0844   CHOLHDL 2.2 07/04/2014 0528   VLDL 19 07/04/2014 0528   LDLCALC 61 11/26/2016 0844   LDLDIRECT 89.1 06/29/2013 1604    Physical Exam:    VS:  BP 126/90   Pulse 62   Ht 5\' 4"  (1.626 m)   Wt 154 lb 1.9 oz (69.9 kg)   SpO2 99%   BMI 26.45 kg/m     Wt Readings from Last 3 Encounters:  10/06/19 154 lb 1.9 oz (69.9 kg)  10/06/19 155 lb (70.3 kg)  09/29/19 155 lb (70.3 kg)     GEN: Compatible with age or slightly younger in appearance. No acute distress HEENT: Normal NECK: No JVD. LYMPHATICS: No lymphadenopathy CARDIAC: Irregularly irregular RR without murmur, gallop, or edema. VASCULAR:  Normal Pulses. No bruits. RESPIRATORY:  Clear to auscultation without rales, wheezing or rhonchi  ABDOMEN: Soft, non-tender, non-distended, No pulsatile mass, MUSCULOSKELETAL: No deformity  SKIN: Warm and dry NEUROLOGIC:  Alert and oriented x 3 PSYCHIATRIC:  Normal affect   ASSESSMENT:    1. Coronary artery disease involving native coronary artery of native heart with angina pectoris ([place])   2. Essential hypertension   3. Permanent atrial fibrillation ([place])   4. Chronic anticoagulation   5. 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 Educated about COVID-19 virus infection    PLAN:    In order of  problems listed  above:  1. Asymptomatic with reference to angina.  Secondary prevention briefly reviewed. 2. Blood pressure is excellently controlled 3. A. fib is still present with good rate control.  She wants to discuss [place].  I informed her that this may not lead to a recommendation to discontinue anticoagulation.  After further discussion, she decided to wait on referral which I offered. 4. Low-dose apixaban due to age and kidney impairment.  Need to determine if her current dosage is appropriate for weight, age, and kidney function. She has been falling.  She has had bleeding related to trauma. 5. LDL target less than 70 6. I may need to have her seen by a hematologist to decide whether I should continue apixaban.  She bruises and bleeds quite easily likely related to the combined impact of Xa inhibition and factor XI deficiency. 7. 3W,'s and COVID-19 vaccination advocated and endorsed by the patient.   Medication Adjustments/Labs and Tests Ordered: Current medicines are reviewed at length with the patient today.  Concerns regarding medicines are outlined above.  Orders Placed This Encounter  Procedures  . EKG 12-Lead   No orders of the defined types were placed in this encounter.   Patient Instructions  Medication Instructions:  Your physician recommends that you continue on your current medications as directed. Please refer to the Current Medication list given to you today.  *If you need a refill on your cardiac medications before your next appointment, please call your pharmacy*  Lab Work: None If you have labs (blood work) drawn today and your tests are completely normal, you will receive your results only by: Marland Kitchen MyChart Message (if you have MyChart) OR . A paper copy in the mail If you have any lab test that is abnormal or we need to change your treatment, we will call you to review the results.  Testing/Procedures: None  Follow-Up: At [facility], you and your health needs are  our priority.  As part of our continuing mission to provide you with exceptional heart care, we have created designated Provider Care Teams.  These Care Teams include your primary Cardiologist (physician) and Advanced Practice Providers (APPs -  Physician Assistants and Nurse Practitioners) who all work together to provide you with the care you need, when you need it.  Your next appointment:   9-12 month(s)  The format for your next appointment:   In Person  Provider:   You may see Sinclair Grooms, MD or one of the following Advanced Practice Providers on your designated Care Team:    Truitt Merle, NP  Cecilie Kicks, NP  Kathyrn Drown, NP   Other Instructions      Signed, Sinclair Grooms, MD  11/08/2019 9:11 PM    [place]

## 2019-10-06 ENCOUNTER — Encounter: Payer: Self-pay | Admitting: Internal Medicine

## 2019-10-06 ENCOUNTER — Ambulatory Visit: Payer: Medicare Other | Admitting: Interventional Cardiology

## 2019-10-06 ENCOUNTER — Ambulatory Visit: Payer: Medicare Other | Admitting: Internal Medicine

## 2019-10-06 ENCOUNTER — Encounter: Payer: Self-pay | Admitting: Interventional Cardiology

## 2019-10-06 ENCOUNTER — Other Ambulatory Visit: Payer: Self-pay | Admitting: Internal Medicine

## 2019-10-06 ENCOUNTER — Other Ambulatory Visit: Payer: Self-pay

## 2019-10-06 VITALS — BP 134/86 | HR 60 | Temp 97.8°F | Ht 64.0 in | Wt 155.0 lb

## 2019-10-06 VITALS — BP 126/90 | HR 62 | Ht 64.0 in | Wt 154.1 lb

## 2019-10-06 DIAGNOSIS — K5730 Diverticulosis of large intestine without perforation or abscess without bleeding: Secondary | ICD-10-CM

## 2019-10-06 DIAGNOSIS — K589 Irritable bowel syndrome without diarrhea: Secondary | ICD-10-CM

## 2019-10-06 DIAGNOSIS — E785 Hyperlipidemia, unspecified: Secondary | ICD-10-CM | POA: Diagnosis not present

## 2019-10-06 DIAGNOSIS — R109 Unspecified abdominal pain: Secondary | ICD-10-CM | POA: Diagnosis not present

## 2019-10-06 DIAGNOSIS — I4821 Permanent atrial fibrillation: Secondary | ICD-10-CM

## 2019-10-06 DIAGNOSIS — I10 Essential (primary) hypertension: Secondary | ICD-10-CM | POA: Diagnosis not present

## 2019-10-06 DIAGNOSIS — I25119 Atherosclerotic heart disease of native coronary artery with unspecified angina pectoris: Secondary | ICD-10-CM

## 2019-10-06 DIAGNOSIS — Z7901 Long term (current) use of anticoagulants: Secondary | ICD-10-CM | POA: Diagnosis not present

## 2019-10-06 DIAGNOSIS — Z1231 Encounter for screening mammogram for malignant neoplasm of breast: Secondary | ICD-10-CM

## 2019-10-06 DIAGNOSIS — Z7189 Other specified counseling: Secondary | ICD-10-CM

## 2019-10-06 DIAGNOSIS — D681 Hereditary factor XI deficiency: Secondary | ICD-10-CM

## 2019-10-06 NOTE — Patient Instructions (Signed)
Medication Instructions:  Your physician recommends that you continue on your current medications as directed. Please refer to the Current Medication list given to you today.  *If you need a refill on your cardiac medications before your next appointment, please call your pharmacy*  Lab Work: None If you have labs (blood work) drawn today and your tests are completely normal, you will receive your results only by: . MyChart Message (if you have MyChart) OR . A paper copy in the mail If you have any lab test that is abnormal or we need to change your treatment, we will call you to review the results.  Testing/Procedures: None  Follow-Up: At [facility], you and your health needs are our priority.  As part of our continuing mission to provide you with exceptional heart care, we have created designated Provider Care Teams.  These Care Teams include your primary Cardiologist (physician) and Advanced Practice Providers (APPs -  Physician Assistants and Nurse Practitioners) who all work together to provide you with the care you need, when you need it.  Your next appointment:   9-12 month(s)  The format for your next appointment:   In Person  Provider:   You may see Henry W Smith III, MD or one of the following Advanced Practice Providers on your designated Care Team:    Lori Gerhardt, NP  Laura Ingold, NP  Jill McDaniel, NP   Other Instructions   

## 2019-10-06 NOTE — Patient Instructions (Signed)
If you are age 83 or older, your body mass index should be between 23-30. Your Body mass index is 26.61 kg/m. If this is out of the aforementioned range listed, please consider follow up with your Primary Care Provider.  If you are age 34 or younger, your body mass index should be between 19-25. Your Body mass index is 26.61 kg/m. If this is out of the aformentioned range listed, please consider follow up with your Primary Care Provider.   Please try taking Citrucel fiber, 1 tablespoon in water or juice daily.   It was a pleasure to see you today!  Dr.Perry

## 2019-10-06 NOTE — Progress Notes (Signed)
HISTORY OF PRESENT ILLNESS:  Hailey [name] is a 85 y.o. female with multiple medical problems who established with this practice August 20, 2018.  See that dictation.  Previous patient of Dr. Earlean Shawl.  Last seen by myself May 11, 2019 regarding an exacerbation of her irritable bowel syndrome.  See that dictation.  Subsequently seen by the GI nurse practitioner August 17, 2019 regarding lower abdominal pain.  That encounter has been reviewed.  Blood work at that time was unremarkable.  Normal hemoglobin at 12.2.  Normal white blood cell 8.0.  A contrast-enhanced CT scan of the abdomen and pelvis was performed August 20, 2019.  This revealed diverticulosis without diverticulitis and increased colonic stool.  Last colonoscopy 2013 was negative for neoplasia.  Extensive diverticulosis in the left colon reported.  Patient tells me that after that visit she developed an acute infectious process with nausea, vomiting, and diarrhea.  This also afflicted her husband.  She has since improved.  She tells me that she has been taking probiotic therapy since and has noticed her symptom complex to improve.  Still with some occasional lower abdominal cramping and loose stools at times.  No bleeding.  Weight has been stable.  Good appetite.  REVIEW OF SYSTEMS:  All non-GI ROS negative unless otherwise stated in the HPI except for allergies, anxiety, arthritis, back pain, sleeping problems, irregular heartbeat, shortness of breath  Past Medical History:  Diagnosis Date  . Acute blood loss anemia 12/25/2017  . Allergic rhinitis   . Anxiety   . Arthritis    "my whole spine" (07/01/2017)  . Atrial fibrillation ([place])   . Bowel obstruction ([place])    in [place]  . Bradycardia with 41-50 beats per minute 12/27/2017  . Cellulitis of leg, right with large prepatella hematoma and open wounds 12/26/2017  . CKD (chronic kidney disease) stage 2, GFR 60-89 ml/min   . Colon polyps   . Coronary artery disease    10/18  PCI/DES to p/m LCx with cutting balloon to mLcx  . Diverticulosis of colon   . GERD (gastroesophageal reflux disease)   . Hip bursitis 2010   Dr Para March, Post op seroma  . History of colon polyps   . HSV (herpes simplex virus) anogenital infection 07/2019  . HTN (hypertension)   . IBS (irritable bowel syndrome)    constipation predominant - Dr Earlean Shawl  . Lichen sclerosus   . Osteopenia 11/2016   T score -2.0 FRAX 15%/4.3%  . PAC (premature atrial contraction)    Symptomatiic  . Right bundle branch block (RBBB) on electrocardiogram (ECG) 12/27/2017  . Scoliosis   . SVT (supraventricular tachycardia) ([place])    brief history    Past Surgical History:  Procedure Laterality Date  . ANTERIOR AND POSTERIOR VAGINAL REPAIR  01/2002   Archie Endo 01/23/2011  . APPENDECTOMY  1948  . CARDIAC CATHETERIZATION  06/26/2017  . CORONARY ANGIOPLASTY WITH STENT PLACEMENT  07/01/2017  . CORONARY ATHERECTOMY N/A 07/01/2017   Procedure: CORONARY ATHERECTOMY;  Surgeon: Belva Crome, MD;  Location: [place] [facility];  Service: Cardiovascular;  Laterality: N/A;  . CORONARY STENT INTERVENTION N/A 07/01/2017   Procedure: CORONARY STENT INTERVENTION;  Surgeon: Belva Crome, MD;  Location: [place] [facility];  Service: Cardiovascular;  Laterality: N/A;  . HAMMER TOE SURGERY    . HEMORRHOID BANDING    . HIP SURGERY Left 04/2009   hip examination under anesthesia followed by greater trochanteric bursectomy; iliotibial band tenotomy/notes 01/20/2011  . I &  D EXTREMITY Right 01/10/2018   Procedure: IRRIGATION AND DEBRIDEMENT RIGHT KNEE, APPLY WOUND VAC;  Surgeon: Newt Minion, MD;  Location: [place];  Service: Orthopedics;  Laterality: Right;  . KNEE BURSECTOMY Right 04/2009   Archie Endo 01/09/2011  . LEFT HEART CATH AND CORONARY ANGIOGRAPHY N/A 06/26/2017   Procedure: LEFT HEART CATH AND CORONARY ANGIOGRAPHY;  Surgeon: Belva Crome, MD;  Location: [place] [facility];  Service: Cardiovascular;  Laterality: N/A;  .  PUBOVAGINAL SLING  01/2002   Archie Endo 01/23/2011  . REDUCTION MAMMAPLASTY    . TEMPORARY PACEMAKER N/A 07/01/2017   Procedure: TEMPORARY PACEMAKER;  Surgeon: Belva Crome, MD;  Location: [place] [facility];  Service: Cardiovascular;  Laterality: N/A;  . VAGINAL HYSTERECTOMY  01/2002   Vaginal hysterectomy, bilateral salpingo-oophorectomy/notes 01/23/2011    Social History Hailey [name]  reports that she quit smoking about 40 years ago. Her smoking use included cigarettes. She has a 7.00 pack-year smoking history. She has never used smokeless tobacco. She reports current alcohol use of about 7.0 standard drinks of alcohol per week. She reports that she does not use drugs.  family history includes Colon cancer in her mother; Diabetes in her father; Pancreatic cancer in her brother; Prostate cancer in her brother and father; Stomach cancer in her son.  Allergies  Allergen Reactions  . Macrobid [employer] Macro] Other (See Comments)    Nausea, stomach cramps, fatigue , headache.  . Meloxicam Other (See Comments)    Jittery and headache  . Digoxin And Related     headaches  . Mobic [Meloxicam]   . Sulfamethoxazole-Trimethoprim Nausea Only       PHYSICAL EXAMINATION: Vital signs: BP 134/86   Pulse 60   Temp 97.8 F (36.6 C)   Ht 5\' 4"  (1.626 m)   Wt 155 lb (70.3 kg)   BMI 26.61 kg/m   Constitutional: generally well-appearing, no acute distress Psychiatric: alert and oriented x3, cooperative Eyes: extraocular movements intact, anicteric, conjunctiva pink Mouth: oral pharynx moist, no lesions Neck: supple no lymphadenopathy Cardiovascular: heart regular rate and rhythm, no murmur Lungs: clear to auscultation bilaterally Abdomen: soft, nontender, nondistended, no obvious ascites, no peritoneal signs, normal bowel sounds, no organomegaly Rectal: Omitted Extremities: no clubbing, cyanosis, or lower extremity edema bilaterally Skin: no lesions on visible  extremities Neuro: No focal deficits.  Cranial nerves intact  ASSESSMENT:  1.  Irritable bowel syndrome.  Recent exacerbation 2.  GERD.  Doing well on current regimen 3.  Last colonoscopy with diverticulosis.  No neoplasia.  Aged out of surveillance program   PLAN:  1.  Fiber supplementation 2.  Continue probiotic 3.  Antispasmodics as needed 4.  Continue reflux regimen 5.  GI follow-up as needed.  Ongoing general medical care with Dr. Alain Marion Total of 20 minutes was spent preparing to see the patient, reviewing relevant test, obtaining history, performing physical examination, counseling her regarding her GI diagnoses, reinforcing ordered therapies, and documenting clinical information in health record

## 2019-10-19 DIAGNOSIS — S0993XA Unspecified injury of face, initial encounter: Secondary | ICD-10-CM | POA: Diagnosis not present

## 2019-10-30 ENCOUNTER — Ambulatory Visit: Payer: Medicare Other | Admitting: Interventional Cardiology

## 2019-10-30 ENCOUNTER — Telehealth: Payer: Self-pay | Admitting: *Deleted

## 2019-10-30 MED ORDER — VALACYCLOVIR HCL 500 MG PO TABS: ORAL_TABLET | ORAL | 1 refills | Status: DC

## 2019-10-30 NOTE — Telephone Encounter (Signed)
Rx sent 

## 2019-10-30 NOTE — Telephone Encounter (Signed)
Yes, that would be fine. Thank you.

## 2019-10-30 NOTE — Telephone Encounter (Signed)
Patient has HSV-2 rare outbreak, would like to have a supply at home to have on hand when needed. Requesting Valtrex 500 mg tablet refill. Okay to refill?

## 2019-11-06 ENCOUNTER — Other Ambulatory Visit: Payer: Self-pay | Admitting: Interventional Cardiology

## 2019-11-06 NOTE — Telephone Encounter (Addendum)
Prescription refill request for Eliquis received.  Last office visit: 10/06/2019 Scr: 1.06 08/25/2019 Age: 85 yo Weight: 69.9 kg   Pt qualifies to have a dose increase. Messaging Dr. Tamala Julian to advise.

## 2019-11-09 ENCOUNTER — Telehealth: Payer: Self-pay | Admitting: *Deleted

## 2019-11-09 DIAGNOSIS — I4821 Permanent atrial fibrillation: Secondary | ICD-10-CM

## 2019-11-09 DIAGNOSIS — D681 Hereditary factor XI deficiency: Secondary | ICD-10-CM

## 2019-11-09 NOTE — Telephone Encounter (Signed)
-----   Message from Belva Crome, MD sent at 11/08/2019  9:15 PM EST ----- Please schedule for Hematology appointment to determine if XI deficiency and Xa inhibitor use will require reduction in anticoagulation intensity? Or, Does it matter? She has excessive bleeding amf bruising. ----- Message ----- From: Lutricia Feil, RN Sent: 11/06/2019   1:53 PM EST To: Belva Crome, MD, Loren Racer, RN  Prescription refill request received for Eliquis 2.5 mg BID.   Last office visit: 10/06/2019 Scr: 1.06 08/25/2019 Age: 85 yo Weight: 69.9 kg   Per dosing criteria pt qualifies to be on Eliquis 5mg  BID. However, she was decreased last year due to bruising on her leg. Per documentation pt has had a fall in December. In her last office visit there is mention of her not being on the correct dose of Eliquis and mention of her seeing a hematologist to see is she should continue Eliquis.   Please advise.   Thank you.

## 2019-11-09 NOTE — Telephone Encounter (Signed)
Dr. Tamala Julian recommended referring pt to hematology. Spoke to Dr. Thompson Caul nurse who stated that she has attempted to call and update the pt but has not gotten in touch with pt yet. She stated she is the process of referring pt to hematology.   Will go ahead and refill prescription with Eliquis 2.5 mg BID.

## 2019-11-09 NOTE — Telephone Encounter (Signed)
Left message to call back  

## 2019-11-11 ENCOUNTER — Ambulatory Visit: Payer: Medicare Other

## 2019-11-16 NOTE — Telephone Encounter (Signed)
Spoke with pt and she was in agreement with plan.  Referral sent in for hematology.

## 2019-11-18 ENCOUNTER — Telehealth: Payer: Self-pay | Admitting: Hematology

## 2019-11-18 NOTE — Telephone Encounter (Signed)
Scheduled per referral. Called and spoke with pt, confirmed 3/24 appt. Pt made aware to arrive 15-30 mins early and to also bring insurance card with photo ID.

## 2019-11-25 DIAGNOSIS — Z0279 Encounter for issue of other medical certificate: Secondary | ICD-10-CM

## 2019-11-30 ENCOUNTER — Ambulatory Visit: Payer: Medicare Other
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
## 2019-12-02 ENCOUNTER — Inpatient Hospital Stay: Payer: Medicare Other | Attending: Hematology | Admitting: Hematology

## 2019-12-02 ENCOUNTER — Inpatient Hospital Stay: Payer: Medicare Other

## 2019-12-02 ENCOUNTER — Other Ambulatory Visit: Payer: Self-pay

## 2019-12-02 VITALS — BP 129/103 | HR 78 | Temp 97.8°F | Resp 18 | Ht 64.0 in | Wt 158.2 lb

## 2019-12-02 DIAGNOSIS — I4891 Unspecified atrial fibrillation: Secondary | ICD-10-CM | POA: Diagnosis not present

## 2019-12-02 DIAGNOSIS — I2510 Atherosclerotic heart disease of native coronary artery without angina pectoris: Secondary | ICD-10-CM | POA: Diagnosis not present

## 2019-12-02 DIAGNOSIS — Z7901 Long term (current) use of anticoagulants: Secondary | ICD-10-CM | POA: Insufficient documentation

## 2019-12-02 DIAGNOSIS — Z8042 Family history of malignant neoplasm of prostate: Secondary | ICD-10-CM | POA: Diagnosis not present

## 2019-12-02 DIAGNOSIS — D681 Hereditary factor XI deficiency: Secondary | ICD-10-CM | POA: Diagnosis not present

## 2019-12-02 DIAGNOSIS — Z87891 Personal history of nicotine dependence: Secondary | ICD-10-CM | POA: Insufficient documentation

## 2019-12-02 DIAGNOSIS — R296 Repeated falls: Secondary | ICD-10-CM | POA: Diagnosis not present

## 2019-12-02 DIAGNOSIS — Z800 Family history of malignant neoplasm of digestive organs: Secondary | ICD-10-CM | POA: Diagnosis not present

## 2019-12-02 LAB — CMP (CANCER CENTER ONLY)
ALT: 12 U/L (ref 0–44)
AST: 23 U/L (ref 15–41)
Albumin: 3.8 g/dL (ref 3.5–5.0)
Alkaline Phosphatase: 76 U/L (ref 38–126)
Anion gap: 8 (ref 5–15)
BUN: 22 mg/dL (ref 8–23)
CO2: 24 mmol/L (ref 22–32)
Calcium: 8.9 mg/dL (ref 8.9–10.3)
Chloride: 105 mmol/L (ref 98–111)
Creatinine: 1.03 mg/dL — ABNORMAL HIGH (ref 0.44–1.00)
GFR, Est AFR Am: 58 mL/min — ABNORMAL LOW (ref >60)
GFR, Estimated: 50 mL/min — ABNORMAL LOW (ref >60)
Glucose, Bld: 102 mg/dL — ABNORMAL HIGH (ref 70–99)
Potassium: 4.5 mmol/L (ref 3.5–5.1)
Sodium: 137 mmol/L (ref 135–145)
Total Bilirubin: 0.5 mg/dL (ref 0.3–1.2)
Total Protein: 6.8 g/dL (ref 6.5–8.1)

## 2019-12-02 LAB — CBC WITH DIFFERENTIAL/PLATELET
Abs Immature Granulocytes: 0.03 10*3/uL (ref 0.00–0.07)
Basophils Absolute: 0 10*3/uL (ref 0.0–0.1)
Basophils Relative: 1 %
Eosinophils Absolute: 0.3 10*3/uL (ref 0.0–0.5)
Eosinophils Relative: 4 %
HCT: 34 % — ABNORMAL LOW (ref 36.0–46.0)
Hemoglobin: 10.9 g/dL — ABNORMAL LOW (ref 12.0–15.0)
Immature Granulocytes: 0 %
Lymphocytes Relative: 22 %
Lymphs Abs: 1.8 10*3/uL (ref 0.7–4.0)
MCH: 30.1 pg (ref 26.0–34.0)
MCHC: 32.1 g/dL (ref 30.0–36.0)
MCV: 93.9 fL (ref 80.0–100.0)
Monocytes Absolute: 0.8 10*3/uL (ref 0.1–1.0)
Monocytes Relative: 10 %
Neutro Abs: 5.1 10*3/uL (ref 1.7–7.7)
Neutrophils Relative %: 63 %
Platelets: 283 10*3/uL (ref 150–400)
RBC: 3.62 MIL/uL — ABNORMAL LOW (ref 3.87–5.11)
RDW: 15.6 % — ABNORMAL HIGH (ref 11.5–15.5)
WBC: 8 10*3/uL (ref 4.0–10.5)
nRBC: 0 % (ref 0.0–0.2)

## 2019-12-02 LAB — APTT: aPTT: 52 seconds — ABNORMAL HIGH (ref 24–36)

## 2019-12-02 LAB — PROTIME-INR
INR: 1.1 (ref 0.8–1.2)
Prothrombin Time: 13.9 seconds (ref 11.4–15.2)

## 2019-12-02 NOTE — Patient Instructions (Signed)
Thank you for choosing [facility] to provide your oncology and hematology care.   Should you have questions after your visit to the [facility] ([facility]), please contact this office at [phone] between 8:30 AM and 4:30 PM.  Voice mails left after 4:00 PM may not be returned until the following business day.  Calls received after 4:30 PM will be answered by an off-site Nurse Triage Line.    Prescription Refills:  Please have your pharmacy contact us directly for most prescription requests.  Contact the office directly for refills of narcotics (pain medications). Allow 48-72 hours for refills.  Appointments: Please contact the [facility] scheduling department [phone] for questions regarding [facility] appointment scheduling.  Contact the schedulers with any scheduling changes so that your appointment can be rescheduled in a timely manner.   Central Scheduling for [facility] [phone] - Call to schedule procedures such as PET scans, CT scans, MRI, Ultrasound, etc.  To afford each patient quality time with our providers, please arrive 30 minutes before your scheduled appointment time.  If you arrive late for your appointment, you may be asked to reschedule.  We strive to give you quality time with our providers, and arriving late affects you and other patients whose appointments are after yours. If you are a no show for multiple scheduled visits, you may be dismissed from the clinic at the providers discretion.     Resources: [facility] Social Workers [phone] for additional information on assistance programs or assistance connecting with community support programs   [place] DSS  [phone]: Information regarding food stamps, Medicaid, and utility assistance [place]AT [phone]   [place] [employer]'s shared-ride transportation service for eligible riders who have a disability that prevents them from riding the fixed route bus.   Medicare Rights Center  800-333-4114 Helps people with Medicare understand their rights and benefits, navigate the Medicare system, and secure the quality healthcare they deserve [employer] 800-227-2345 Assists patients locate various types of support and financial assistance Cancer Care: 1-800-813-HOPE (4673) Provides financial assistance, online support groups, medication/co-pay assistance.   Transportation Assistance for appointments at [facility]: Transportation Coordinator [phone]  Again, thank you for choosing [facility] for your care.       

## 2019-12-03 LAB — FACTOR 11 ASSAY: Factor XI Activity: 48 % — ABNORMAL LOW (ref 60–150)

## 2019-12-07 ENCOUNTER — Telehealth: Payer: Self-pay | Admitting: Hematology

## 2019-12-07 NOTE — Telephone Encounter (Signed)
Scheduled per 03/24 los, patient has been called and notified. 

## 2019-12-10 ENCOUNTER — Telehealth: Payer: Self-pay | Admitting: Internal Medicine

## 2019-12-10 NOTE — Telephone Encounter (Signed)
Please advise 

## 2019-12-10 NOTE — Telephone Encounter (Signed)
   Patient calling to report abdomen pain, started yesterday Nausea,gas pain  no diarrhea  Declined to make appointment. Seeking advice. Please call

## 2019-12-11 ENCOUNTER — Telehealth: Payer: Self-pay | Admitting: Family Medicine

## 2019-12-11 NOTE — Telephone Encounter (Signed)
Thank you, AP 

## 2019-12-11 NOTE — Telephone Encounter (Signed)
Received call from nurse line. Patient reports abdominal cramping last 2 days. Pain is 7/10. Lower quadrants only. Not sure if muscle pain or other pain. Did start working out Tuesday. Possibly has been 3-4 days since bowel movement. Given 7/10 pain I advised urgent care evaluation for an abdominal exam to help determine appropriate treatment and work up. RN noted she would advise the patient.

## 2019-12-11 NOTE — Telephone Encounter (Signed)
Office visit with any provider if problems.  Can try Pepcid AC complete over-the-counter.  Thanks

## 2019-12-16 NOTE — Progress Notes (Signed)
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   I discussed the limitations, risks, security and privacy concerns of performing an evaluation and management service by telemedicine and the availability of in-person appointments. I also discussed with the patient that there may be a patient responsible charge related to this service. The patient expressed understanding and agreed to proceed.   Other persons participating in the visit and their role in the encounter:     -Dawayne Cirri, Medical Scribe   Patient's location: Home  Provider's location: [place] [facility] [place] is a wonderful 85 y.o. 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 The patient's last visit with [place] was on 12/02/19. The pt reports that she is doing well overall.  The pt reports she is good. Nothing has changed since the last time meeting. Pt has been getting a lot of bruising from bumping into things.   Lab results today (12/02/19) of CBC w/diff and CMP is as follows: all values are WNL except for RBC at 3.62, Hemoglobin at 10.9, HCT at 34.0, RDW at 15.6, Glucose at 102, Creatinine at 1.03, GFR, Est Non Af Am at 50, Gfr, Est AFR Am at 58.  12/02/19 of Factor 11 Assay at 48 12/02/19 of APTT at 52 12/02/19 of Protime-INR are as follows: all values are WNL  On review of systems, pt reports bruising and denies any other symptoms.    MEDICAL HISTORY:  Past Medical History:  Diagnosis Date  . Acute blood  loss anemia 12/25/2017  . Allergic rhinitis   . Anxiety   . Arthritis    "my whole spine" (07/01/2017)  . Atrial fibrillation ([place])   . Bowel obstruction ([place])    in [place]  . Bradycardia with 41-50 beats per minute 12/27/2017  . Cellulitis of leg, right with large prepatella hematoma and open wounds 12/26/2017  . CKD (chronic kidney disease) stage 2, GFR 60-89 ml/min   . Colon polyps   . Coronary artery disease    10/18 PCI/DES to p/m LCx with cutting balloon to mLcx  . Diverticulosis of colon   . GERD (gastroesophageal reflux disease)   . Hip bursitis 2010   Dr Para March, Post op seroma  . History of colon polyps   . HSV (herpes simplex virus) anogenital infection 07/2019  . HTN (hypertension)   . IBS (irritable bowel syndrome)    constipation predominant - Dr Earlean Shawl  . Lichen sclerosus   . Osteopenia 11/2016   T score -2.0 FRAX 15%/4.3%  . PAC (premature atrial contraction)    Symptomatiic  . Right bundle branch block (RBBB) on electrocardiogram (ECG) 12/27/2017  . Scoliosis   . SVT (supraventricular tachycardia) ([place])    brief history    SURGICAL HISTORY: Past Surgical History:  Procedure Laterality Date  . ANTERIOR AND POSTERIOR VAGINAL REPAIR  01/2002   Archie Endo 01/23/2011  . APPENDECTOMY  1948  . CARDIAC CATHETERIZATION  06/26/2017  . CORONARY ANGIOPLASTY WITH STENT PLACEMENT  07/01/2017  .  CORONARY ATHERECTOMY N/A 07/01/2017   Procedure: CORONARY ATHERECTOMY;  Surgeon: Belva Crome, MD;  Location: [facility];  Service: Cardiovascular;  Laterality: N/A;  . CORONARY STENT INTERVENTION N/A 07/01/2017   Procedure: CORONARY STENT INTERVENTION;  Surgeon: Belva Crome, MD;  Location: [facility];  Service: Cardiovascular;  Laterality: N/A;  . HAMMER TOE SURGERY    . HEMORRHOID BANDING    . HIP SURGERY Left 04/2009   hip examination under anesthesia followed by greater trochanteric bursectomy; iliotibial band tenotomy/notes 01/20/2011  . I & D EXTREMITY  Right 01/10/2018   Procedure: IRRIGATION AND DEBRIDEMENT RIGHT KNEE, APPLY WOUND VAC;  Surgeon: Newt Minion, MD;  Location: [facility];  Service: Orthopedics;  Laterality: Right;  . KNEE BURSECTOMY Right 04/2009   Archie Endo 01/09/2011  . LEFT HEART CATH AND CORONARY ANGIOGRAPHY N/A 06/26/2017   Procedure: LEFT HEART CATH AND CORONARY ANGIOGRAPHY;  Surgeon: Belva Crome, MD;  Location: [facility];  Service: Cardiovascular;  Laterality: N/A;  . PUBOVAGINAL SLING  01/2002   Archie Endo 01/23/2011  . REDUCTION MAMMAPLASTY    . TEMPORARY PACEMAKER N/A 07/01/2017   Procedure: TEMPORARY PACEMAKER;  Surgeon: Belva Crome, MD;  Location: [facility];  Service: Cardiovascular;  Laterality: N/A;  . VAGINAL HYSTERECTOMY  01/2002   Vaginal hysterectomy, bilateral salpingo-oophorectomy/notes 01/23/2011    SOCIAL HISTORY: Social History   Socioeconomic History  . Marital status: Married    Spouse name: Not on file  . Number of children: 2  . Years of education: Not on file  . Highest education level: Not on file  Occupational History  . Occupation: Patent attorney: RETIRED  Tobacco Use  . Smoking status: Former Smoker    Packs/day: 0.25    Years: 28.00    Pack years: 7.00    Types: Cigarettes    Quit date: 1981    Years since quitting: 40.2  . Smokeless tobacco: Never Used  Substance and Sexual Activity  . Alcohol use: Yes    Alcohol/week: 7.0 standard drinks    Types: 7 Standard drinks or equivalent per week    Comment: 7 vodka drinks a week  . Drug use: No  . Sexual activity: Yes    Birth control/protection: Surgical    Comment: 1st intercourse 85 yo--Fewer than 5 partners  Other Topics Concern  . Not on file  Social History Narrative   Regular Exercise -  YES         Social Determinants of Health   Financial Resource Strain:   . Difficulty of Paying Living Expenses:   Food Insecurity:   . Worried About Charity fundraiser in the Last Year:   . Arboriculturist  in the Last Year:   Transportation Needs:   . Film/video editor (Medical):   Marland Kitchen Lack of Transportation (Non-Medical):   Physical Activity:   . Days of Exercise per Week:   . Minutes of Exercise per Session:   Stress:   . Feeling of Stress :   Social Connections:   . Frequency of Communication with Friends and Family:   . Frequency of Social Gatherings with Friends and Family:   . Attends Religious Services:   . Active Member of Clubs or Organizations:   . Attends Archivist Meetings:   Marland Kitchen Marital Status:   Intimate Partner Violence:   . Fear of Current or Ex-Partner:   . Emotionally Abused:   .  Physically Abused:   . Sexually Abused:     FAMILY HISTORY: Family History  Problem Relation Age of Onset  . Colon cancer Mother   . Diabetes Father   . Prostate cancer Father   . Prostate cancer Brother   . Pancreatic cancer Brother   . Stomach cancer Son   . Heart attack Neg Hx   . Stroke Neg Hx   . Esophageal cancer Neg Hx     ALLERGIES:  is allergic to macrobid [nitrofurantoin monohyd macro]; meloxicam; digoxin and related; mobic [meloxicam]; and sulfamethoxazole-trimethoprim.  MEDICATIONS:  Current Outpatient Medications  Medication Sig Dispense Refill  . acetaminophen (TYLENOL) 500 MG tablet Take 1,000 mg by mouth every 6 (six) hours as needed (Coated or capsules).    Marland Kitchen atorvastatin (LIPITOR) 80 MG tablet TAKE 1 TABLET AT 6PM. 90 tablet 3  . cetirizine (ZYRTEC) 10 MG tablet Take 10 mg by mouth daily.    . clobetasol cream (TEMOVATE) 0.05 % APPLY NIGHTLY AS DIRECTED. 30 g 1  . Coenzyme Q10 (COQ10) 200 MG CAPS One a day 100 capsule 0  . DULoxetine (CYMBALTA) 60 MG capsule TAKE 1 CAPSULE DAILY. 90 capsule 1  . ELIQUIS 2.5 MG TABS tablet TAKE 1 TABLET BY MOUTH TWICE DAILY. 180 tablet 0  . esomeprazole (NEXIUM) 40 MG capsule Take 1 capsule (40 mg total) by mouth daily. 90 capsule 3  . hyoscyamine (LEVSIN) 0.125 MG tablet Take 1-2 tablets (0.125-0.25 mg total) by  mouth every 4 (four) hours as needed for up to 10 days for cramping. 100 tablet 3  . LORazepam (ATIVAN) 1 MG tablet Take 1 tablet (1 mg total) by mouth 2 (two) times daily as needed for anxiety or sleep. 180 tablet 1  . metoprolol succinate (TOPROL-XL) 25 MG 24 hr tablet TAKE 3 TABLETS DAILY WITH OR IMMEDIATELY FOLLOWING A MEAL. 270 tablet 3  . nitroGLYCERIN (NITROSTAT) 0.4 MG SL tablet Place 0.4 mg under the tongue every 5 (five) minutes as needed for chest pain (Call 911 at 3rd dose within 15 minutes.).    Marland Kitchen NONFORMULARY OR COMPOUNDED ITEM Estradiol 0.02% vag cream prefilled applicators  S: insert appful (1 gram) intravaginally twice weekly. 24 each 3  . Probiotic Product (ALIGN PO) Take 1 capsule by mouth daily.    . sodium chloride (OCEAN) 0.65 % SOLN nasal spray Place 1 spray into both nostrils as needed for congestion.    Marland Kitchen terconazole (TERAZOL 7) 0.4 % vaginal cream Place 1 applicator vaginally at bedtime. 45 g 0  . valACYclovir (VALTREX) 500 MG tablet Take one tablet by mouth twice daily for 5 days for early onset symptoms. 30 tablet 1   No current facility-administered medications for this visit.    REVIEW OF SYSTEMS:    A 10+ POINT REVIEW OF SYSTEMS WAS OBTAINED including neurology, dermatology, psychiatry, cardiac, respiratory, lymph, extremities, GI, GU, Musculoskeletal, constitutional, breasts, reproductive, HEENT.  All pertinent positives are noted in the HPI.  All others are negative.    PHYSICAL EXAMINATION:  Telehealth Visit   LABORATORY DATA:  I have reviewed the data as listed  . CBC Latest Ref Rng & Units 12/02/2019 08/25/2019 08/17/2019  WBC 4.0 - 10.5 K/uL 8.0 7.4 8.0  Hemoglobin 12.0 - 15.0 g/dL 10.9(L) 10.7(L) 12.2  Hematocrit 36.0 - 46.0 % 34.0(L) 33.3(L) 36.9  Platelets 150 - 400 K/uL 283 215 220.0    . CMP Latest Ref Rng & Units 12/02/2019 08/25/2019 08/20/2019  Glucose 70 - 99 mg/dL 102(H) 89 -  BUN 8 - 23 mg/dL 22 18 -  Creatinine 0.44 - 1.00 mg/dL  1.03(H) 1.06(H) 1.00  Sodium 135 - 145 mmol/L 137 133(L) -  Potassium 3.5 - 5.1 mmol/L 4.5 4.1 -  Chloride 98 - 111 mmol/L 105 103 -  CO2 22 - 32 mmol/L 24 21(L) -  Calcium 8.9 - 10.3 mg/dL 8.9 8.6(L) -  Total Protein 6.5 - 8.1 g/dL 6.8 5.8(L) -  Total Bilirubin 0.3 - 1.2 mg/dL 0.5 0.8 -  Alkaline Phos 38 - 126 U/L 76 56 -  AST 15 - 41 U/L 23 23 -  ALT 0 - 44 U/L 12 9 -   Component     Latest Ref Rng & Units 12/02/2019  Prothrombin Time     11.4 - 15.2 seconds 13.9  INR     0.8 - 1.2 1.1  Factor XI Activity     60 - 150 % 48 (L)  APTT     24 - 36 seconds 52 (H)    RADIOGRAPHIC STUDIES: I have personally reviewed the radiological images as listed and agreed with the findings in the report. No results found.  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 Mild with FXI activity levels @ 48%   PLAN: -Discussed pt labwork today, 12/02/19; of CBC w/diff and CMP is as follows: all values are WNL except for RBC at 3.62, Hemoglobin at 10.9, HCT at 34.0, RDW at 15.6, Glucose at 102, Creatinine at 1.03, GFR, Est Non Af Am at 50, Gfr, Est AFR Am at 58.  -Discussed 12/02/19 of Factor 11 Assay at 48 -Discussed 12/02/19 of APTT at 52 -Discussed 12/02/19 of Protime-INR are as follows: all values are WNL -Discussed risk of bleeding being a mild presentation  -Discussed bleeding if falling with large lacerations regardless of being on blood thinners or not -Advised superficial bruises could be related to age -"senile purpura" -patient asked about watchman procedure- f/u with cardiologist  -Advised pt that Factor XI is most active in situations of traumatic bleeding  -Advised pt that she has a mild presentation of Factor XI activity. -Advised pt that Factor XI is somewhat protective of blood clots in the lower extremities and would likely lower the risk of stroke from Afib -Deciding to continue Eliquis would depend on  how great her risk of falls/injuries are and how great a risk is a chance of stroke from Afib  -Recurrent falls would be a reason to be off blood thinners - to be determined by PCP/cardiologist.  -Factor 11 deficiency would not be a reason to be taken off blood thinners for Afib -Recommend 2.5 mg of ELIQUIS twice a day -Return to clinic as needed -f/u with PCP and cardiologist   FOLLOW UP: Return to clinic as needed   The total time spent in the appt was 20 minutes and more than 50% was on counseling and direct patient cares.  All of the patient's questions were answered with apparent satisfaction. The patient knows to call the clinic with any problems, questions or concerns.     Sullivan Lone MD [place] AAHIVMS [facility] Hematology/Oncology Physician [facility]  (Office):       [phone] (Work cell):  [phone] (Fax):           [phone]  12/17/2019 3:57 PM  I, Dawayne Cirri am acting as a scribe for Dr. Sullivan Lone.   .I have reviewed  the above documentation for accuracy and completeness, and I agree with the above. Brunetta Genera MD

## 2019-12-17 ENCOUNTER — Ambulatory Visit
Admission: RE | Admit: 2019-12-17 | Discharge: 2019-12-17 | Disposition: A | Discharge status: Home / Self Care | Payer: Medicare Other | Source: Ambulatory Visit | Attending: Internal Medicine | Admitting: Internal Medicine

## 2019-12-17 ENCOUNTER — Inpatient Hospital Stay: Payer: Medicare Other | Attending: Hematology | Admitting: Hematology

## 2019-12-17 ENCOUNTER — Other Ambulatory Visit: Payer: Self-pay

## 2019-12-17 DIAGNOSIS — D681 Hereditary factor XI deficiency: Secondary | ICD-10-CM | POA: Diagnosis not present

## 2019-12-17 DIAGNOSIS — Z1231 Encounter for screening mammogram for malignant neoplasm of breast: Secondary | ICD-10-CM | POA: Diagnosis not present

## 2019-12-17 NOTE — Telephone Encounter (Signed)
See other TE.

## 2019-12-23 DIAGNOSIS — D0462 Carcinoma in situ of skin of left upper limb, including shoulder: Secondary | ICD-10-CM | POA: Diagnosis not present

## 2019-12-23 DIAGNOSIS — L821 Other seborrheic keratosis: Secondary | ICD-10-CM | POA: Diagnosis not present

## 2019-12-23 DIAGNOSIS — D485 Neoplasm of uncertain behavior of skin: Secondary | ICD-10-CM | POA: Diagnosis not present

## 2019-12-23 DIAGNOSIS — L578 Other skin changes due to chronic exposure to nonionizing radiation: Secondary | ICD-10-CM | POA: Diagnosis not present

## 2019-12-23 DIAGNOSIS — B353 Tinea pedis: Secondary | ICD-10-CM | POA: Diagnosis not present

## 2019-12-23 DIAGNOSIS — L570 Actinic keratosis: Secondary | ICD-10-CM | POA: Diagnosis not present

## 2019-12-28 ENCOUNTER — Encounter: Payer: Self-pay | Admitting: Obstetrics and Gynecology

## 2019-12-28 ENCOUNTER — Other Ambulatory Visit: Payer: Self-pay

## 2019-12-28 ENCOUNTER — Ambulatory Visit: Payer: Medicare Other | Admitting: Obstetrics and Gynecology

## 2019-12-28 VITALS — BP 124/80

## 2019-12-28 DIAGNOSIS — B9689 Other specified bacterial agents as the cause of diseases classified elsewhere: Secondary | ICD-10-CM | POA: Diagnosis not present

## 2019-12-28 DIAGNOSIS — N760 Acute vaginitis: Secondary | ICD-10-CM | POA: Diagnosis not present

## 2019-12-28 DIAGNOSIS — N3000 Acute cystitis without hematuria: Secondary | ICD-10-CM | POA: Diagnosis not present

## 2019-12-28 DIAGNOSIS — R350 Frequency of micturition: Secondary | ICD-10-CM

## 2019-12-28 DIAGNOSIS — N898 Other specified noninflammatory disorders of vagina: Secondary | ICD-10-CM | POA: Diagnosis not present

## 2019-12-28 LAB — WET PREP FOR TRICH, YEAST, CLUE

## 2019-12-28 MED ORDER — SULFAMETHOXAZOLE-TRIMETHOPRIM 800-160 MG PO TABS: 1.0000 | ORAL_TABLET | Freq: Two times a day (BID) | ORAL | 0 refills | Status: AC

## 2019-12-28 MED ORDER — METRONIDAZOLE 0.75 % VA GEL: 1.0000 | Freq: Every day | VAGINAL | 0 refills | Status: AC

## 2019-12-28 NOTE — Patient Instructions (Addendum)
Please take the Bactrim DS antibiotic for the UTI for 5 days, then, take the metrogel (metronidazole) vaginally for 5 nights to treat bacterial vaginosis Urinary Tract Infection, Adult  A urinary tract infection (UTI) is an infection of any part of the urinary tract. The urinary tract includes the kidneys, ureters, bladder, and urethra. These organs make, store, and get rid of urine in the body. Your health care provider may use other names to describe the infection. An upper UTI affects the ureters and kidneys (pyelonephritis). A lower UTI affects the bladder (cystitis) and urethra (urethritis). What are the causes? Most urinary tract infections are caused by bacteria in your genital area, around the entrance to your urinary tract (urethra). These bacteria grow and cause inflammation of your urinary tract. What increases the risk? You are more likely to develop this condition if:  You have a urinary catheter that stays in place (indwelling).  You are not able to control when you urinate or have a bowel movement (you have incontinence).  You are female and you: ? Use a spermicide or diaphragm for birth control. ? Have low estrogen levels. ? Are pregnant.  You have certain genes that increase your risk (genetics).  You are sexually active.  You take antibiotic medicines.  You have a condition that causes your flow of urine to slow down, such as: ? An enlarged prostate, if you are female. ? Blockage in your urethra (stricture). ? A kidney stone. ? A nerve condition that affects your bladder control (neurogenic bladder). ? Not getting enough to drink, or not urinating often.  You have certain medical conditions, such as: ? Diabetes. ? A weak disease-fighting system (immunesystem). ? Sickle cell disease. ? Gout. ? Spinal cord injury. What are the signs or symptoms? Symptoms of this condition include:  Needing to urinate right away (urgently).  Frequent urination or passing small  amounts of urine frequently.  Pain or burning with urination.  Blood in the urine.  Urine that smells bad or unusual.  Trouble urinating.  Cloudy urine.  Vaginal discharge, if you are female.  Pain in the abdomen or the lower back. You may also have:  Vomiting or a decreased appetite.  Confusion.  Irritability or tiredness.  A fever.  Diarrhea. The first symptom in older adults may be confusion. In some cases, they may not have any symptoms until the infection has worsened. How is this diagnosed? This condition is diagnosed based on your medical history and a physical exam. You may also have other tests, including:  Urine tests.  Blood tests.  Tests for sexually transmitted infections (STIs). If you have had more than one UTI, a cystoscopy or imaging studies may be done to determine the cause of the infections. How is this treated? Treatment for this condition includes:  Antibiotic medicine.  Over-the-counter medicines to treat discomfort.  Drinking enough water to stay hydrated. If you have frequent infections or have other conditions such as a kidney stone, you may need to see a health care provider who specializes in the urinary tract (urologist). In rare cases, urinary tract infections can cause sepsis. Sepsis is a life-threatening condition that occurs when the body responds to an infection. Sepsis is treated in the hospital with IV antibiotics, fluids, and other medicines. Follow these instructions at home:  Medicines  Take over-the-counter and prescription medicines only as told by your health care provider.  If you were prescribed an antibiotic medicine, take it as told by your health care provider.  Do not stop using the antibiotic even if you start to feel better. General instructions  Make sure you: ? Empty your bladder often and completely. Do not hold urine for long periods of time. ? Empty your bladder after sex. ? Wipe from front to back after a  bowel movement if you are female. Use each tissue one time when you wipe.  Drink enough fluid to keep your urine pale yellow.  Keep all follow-up visits as told by your health care provider. This is important. Contact a health care provider if:  Your symptoms do not get better after 1-2 days.  Your symptoms go away and then return. Get help right away if you have:  Severe pain in your back or your lower abdomen.  A fever.  Nausea or vomiting. Summary  A urinary tract infection (UTI) is an infection of any part of the urinary tract, which includes the kidneys, ureters, bladder, and urethra.  Most urinary tract infections are caused by bacteria in your genital area, around the entrance to your urinary tract (urethra).  Treatment for this condition often includes antibiotic medicines.  If you were prescribed an antibiotic medicine, take it as told by your health care provider. Do not stop using the antibiotic even if you start to feel better.  Keep all follow-up visits as told by your health care provider. This is important. This information is not intended to replace advice given to you by your health care provider. Make sure you discuss any questions you have with your health care provider. Document Revised: 08/14/2018 Document Reviewed: 03/06/2018 Elsevier Patient Education  [employer].  Bacterial Vaginosis  Bacterial vaginosis is a vaginal infection that occurs when the normal balance of bacteria in the vagina is disrupted. It results from an overgrowth of certain bacteria. This is the most common vaginal infection among women ages 62-44. Because bacterial vaginosis increases your risk for STIs (sexually transmitted infections), getting treated can help reduce your risk for chlamydia, gonorrhea, herpes, and HIV (human immunodeficiency virus). Treatment is also important for preventing complications in pregnant women, because this condition can cause an early (premature)  delivery. What are the causes? This condition is caused by an increase in harmful bacteria that are normally present in small amounts in the vagina. However, the reason that the condition develops is not fully understood. What increases the risk? The following factors may make you more likely to develop this condition:  Having a new sexual partner or multiple sexual partners.  Having unprotected sex.  Douching.  Having an intrauterine device (IUD).  Smoking.  Drug and alcohol abuse.  Taking certain antibiotic medicines.  Being pregnant. You cannot get bacterial vaginosis from toilet seats, bedding, swimming pools, or contact with objects around you. What are the signs or symptoms? Symptoms of this condition include:  Grey or white vaginal discharge. The discharge can also be watery or foamy.  A fish-like odor with discharge, especially after sexual intercourse or during menstruation.  Itching in and around the vagina.  Burning or pain with urination. Some women with bacterial vaginosis have no signs or symptoms. How is this diagnosed? This condition is diagnosed based on:  Your medical history.  A physical exam of the vagina.  Testing a sample of vaginal fluid under a microscope to look for a large amount of bad bacteria or abnormal cells. Your health care provider may use a cotton swab or a small wooden spatula to collect the sample. How is this treated? This  condition is treated with antibiotics. These may be given as a pill, a vaginal cream, or a medicine that is put into the vagina (suppository). If the condition comes back after treatment, a second round of antibiotics may be needed. Follow these instructions at home: Medicines  Take over-the-counter and prescription medicines only as told by your health care provider.  Take or use your antibiotic as told by your health care provider. Do not stop taking or using the antibiotic even if you start to feel  better. General instructions  If you have a female sexual partner, tell her that you have a vaginal infection. She should see her health care provider and be treated if she has symptoms. If you have a female sexual partner, he does not need treatment.  During treatment: ? Avoid sexual activity until you finish treatment. ? Do not douche. ? Avoid alcohol as directed by your health care provider. ? Avoid breastfeeding as directed by your health care provider.  Drink enough water and fluids to keep your urine clear or pale yellow.  Keep the area around your vagina and rectum clean. ? Wash the area daily with warm water. ? Wipe yourself from front to back after using the toilet.  Keep all follow-up visits as told by your health care provider. This is important. How is this prevented?  Do not douche.  Wash the outside of your vagina with warm water only.  Use protection when having sex. This includes latex condoms and dental dams.  Limit how many sexual partners you have. To help prevent bacterial vaginosis, it is best to have sex with just one partner (monogamous).  Make sure you and your sexual partner are tested for STIs.  Wear cotton or cotton-lined underwear.  Avoid wearing tight pants and pantyhose, especially during summer.  Limit the amount of alcohol that you drink.  Do not use any products that contain nicotine or tobacco, such as cigarettes and e-cigarettes. If you need help quitting, ask your health care provider.  Do not use illegal drugs. Where to find more information  Centers for Disease Control and Prevention: AppraiserFraud.fi  American Sexual Health Association (ASHA): www.ashastd.org  U.S. Department of Health and Financial controller, Office on Women's Health: DustingSprays.pl or SecuritiesCard.it Contact a health care provider if:  Your symptoms do not improve, even after treatment.  You have more discharge or  pain when urinating.  You have a fever.  You have pain in your abdomen.  You have pain during sex.  You have vaginal bleeding between periods. Summary  Bacterial vaginosis is a vaginal infection that occurs when the normal balance of bacteria in the vagina is disrupted.  Because bacterial vaginosis increases your risk for STIs (sexually transmitted infections), getting treated can help reduce your risk for chlamydia, gonorrhea, herpes, and HIV (human immunodeficiency virus). Treatment is also important for preventing complications in pregnant women, because the condition can cause an early (premature) delivery.  This condition is treated with antibiotic medicines. These may be given as a pill, a vaginal cream, or a medicine that is put into the vagina (suppository). This information is not intended to replace advice given to you by your health care provider. Make sure you discuss any questions you have with your health care provider. Document Revised: 08/09/2017 Document Reviewed: 05/12/2016 Elsevier Patient Education  2020 [employer].

## 2019-12-28 NOTE — Progress Notes (Signed)
   Hailey [name]  [birth year] [id number]  HPI The patient is a 85 y.o. G2P2001 who presents today for increased urinary frequency and vaginal irritation with itching.  He has had similar symptoms before.  These symptoms have been present for a few weeks now.  Denies vaginal discharge.  She had used some leftover clobetasol cream which helped a little bit with the itching.  Still having urinary urgency and frequency.  Past medical history,surgical history, problem list, medications, allergies, family history and social history were all reviewed and documented as reviewed in the EPIC chart.  ROS:  Feeling well. No dyspnea or chest pain on exertion.  No abdominal pain, change in bowel habits, black or bloody stools.  + Urinary frequency. GYN ROS: No pelvic pain, no abnormal bleeding, no vaginal discharge, + vaginal irritation and itching, no neurological complaints.   Physical Exam  BP 124/80   General: Pleasant female, no acute distress, alert and oriented PELVIC EXAM: VULVA: normal appearing vulva with no masses, tenderness or lesions, VAGINA: normal appearing vagina with normal color, thick yellow discharge present, no lesions, CERVIX: surgically absent, UTERUS: surgically absent, vaginal cuff normal, ADNEXA: no masses, non tender WET MOUNT done - results: clue cells, negative hyphae, trichomonas, WBC, many bacteria, 7-12 epithelial cells/hpf Urinalysis 10-20 WBC, 0-2 RBC, 6-10 squamous epithelial, many bacteria, positive nitrite, positive leukocyte estrace, cloudy in color  Assessment 85 yo with bacterial vaginosis and urinary tract infection  Plan I recommend treating the presumed UTI with Bactrim DS twice daily for 5 days.  Bactrim DS is listed in her allergy list but a minor reaction such as nausea is not a reason not to use the antibiotic particularly as she is also sensitive to nitrofurantoin.  We will follow up on the culture results to see if change in antibiotic therapy is needed. I  recommended that after she complete the antibiotic, she should use MetroGel 0.75% vaginal gel nightly for 5 nights to treat the bacterial vaginosis.  Preferred this to the oral preparation due to concern about needing to abstain from any alcoholic beverages. Return if symptoms do not improve.   Joseph Pierini MD, FACOG 12/28/19

## 2019-12-30 ENCOUNTER — Other Ambulatory Visit: Payer: Self-pay | Admitting: Internal Medicine

## 2019-12-30 LAB — URINALYSIS, COMPLETE W/RFL CULTURE
Bilirubin Urine: NEGATIVE
Glucose, UA: NEGATIVE
Hyaline Cast: NONE SEEN /LPF
Nitrites, Initial: POSITIVE — AB
Specific Gravity, Urine: 1.02 (ref 1.001–1.03)
pH: 6.5 (ref 5.0–8.0)

## 2019-12-30 LAB — CULTURE INDICATED

## 2019-12-30 LAB — URINE CULTURE
MICRO NUMBER:: 10378843
SPECIMEN QUALITY:: ADEQUATE

## 2020-01-18 ENCOUNTER — Telehealth: Payer: Self-pay | Admitting: *Deleted

## 2020-01-18 ENCOUNTER — Other Ambulatory Visit: Payer: Self-pay | Admitting: Nurse Practitioner

## 2020-01-18 DIAGNOSIS — N3000 Acute cystitis without hematuria: Secondary | ICD-10-CM

## 2020-01-18 MED ORDER — SULFAMETHOXAZOLE-TRIMETHOPRIM 800-160 MG PO TABS: 1.0000 | ORAL_TABLET | Freq: Two times a day (BID) | ORAL | 0 refills | Status: AC

## 2020-01-18 NOTE — Telephone Encounter (Signed)
Patient informed. 

## 2020-01-18 NOTE — Telephone Encounter (Signed)
Sent in refill for Bactrim BID x 5 days. If her symptoms return after completing the full course of antibiotics she needs to return to office for a retest. Thank you.

## 2020-01-18 NOTE — Telephone Encounter (Signed)
Dr.Kendall patient ) patient called asking if refill on Bactrim DS twice daily for 5 days, was treated on 12/28/19. Completed Rx and felt better, however now having symptoms again, slight burning with urination, frequent urination and urgency.  Please advise

## 2020-01-19 ENCOUNTER — Other Ambulatory Visit: Payer: Self-pay | Admitting: Internal Medicine

## 2020-01-21 ENCOUNTER — Ambulatory Visit: Payer: Medicare Other | Admitting: Sports Medicine

## 2020-01-21 ENCOUNTER — Other Ambulatory Visit: Payer: Self-pay

## 2020-01-21 ENCOUNTER — Encounter: Payer: Self-pay | Admitting: Sports Medicine

## 2020-01-21 VITALS — BP 143/76 | Ht 64.0 in | Wt 154.0 lb

## 2020-01-21 DIAGNOSIS — M25552 Pain in left hip: Secondary | ICD-10-CM | POA: Diagnosis not present

## 2020-01-21 MED ORDER — METHYLPREDNISOLONE ACETATE 80 MG/ML IJ SUSP
80.0000 mg | Freq: Once | INTRAMUSCULAR | Status: AC
  Administered 2020-01-21: 80 mg via INTRA_ARTICULAR

## 2020-01-21 NOTE — Progress Notes (Signed)
PCP: Plotnikov, Evie Lacks, MD  Subjective:   HPI: Patient is a 85 y.o. female here for follow-up of left hip pain.  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  At that time she was given a home exercise program and a corticosteroid injection.  Patient notes this completely resolved her pain after month.  She stopped doing her exercises after that.  Several weeks ago she started developing pain in the left hip again.  The pain is located lateral aspect of her hip.  The pain will radiate down towards her knee.  She denies any back pain that is associated with it.  She denies any numbness or tingling.  Patient notes his pain is very similar to the previous pain she had when she was treated for her greater trochanteric pain syndrome in January.  She has not yet started doing the exercises shown to her at her last visit.  Over-the-counter anti-inflammatories have not significantly improved her pain.   Review of Systems: See HPI above.  Past Medical History:  Diagnosis Date  . Acute blood loss anemia 12/25/2017  . Allergic rhinitis   . Anxiety   . Arthritis    "my whole spine" (07/01/2017)  . Atrial fibrillation ([place])   . Bowel obstruction ([place])    in [place]  . Bradycardia with 41-50 beats per minute 12/27/2017  . Cellulitis of leg, right with large prepatella hematoma and open wounds 12/26/2017  . CKD (chronic kidney disease) stage 2, GFR 60-89 ml/min   . Colon polyps   . Coronary artery disease    10/18 PCI/DES to p/m LCx with cutting balloon to mLcx  . Diverticulosis of colon   . GERD (gastroesophageal reflux disease)   . Hip bursitis 2010   Dr Para March, Post op seroma  . History of colon polyps   . HSV (herpes simplex virus) anogenital infection 07/2019  . HTN (hypertension)   . IBS (irritable bowel syndrome)    constipation predominant - Dr Earlean Shawl  . Lichen sclerosus   . Osteopenia 11/2016   T score -2.0 FRAX 15%/4.3%  .  PAC (premature atrial contraction)    Symptomatiic  . Right bundle branch block (RBBB) on electrocardiogram (ECG) 12/27/2017  . Scoliosis   . SVT (supraventricular tachycardia) ([place])    brief history    Current Outpatient Medications on File Prior to Visit  Medication Sig Dispense Refill  . acetaminophen (TYLENOL) 500 MG tablet Take 1,000 mg by mouth every 6 (six) hours as needed (Coated or capsules).    Marland Kitchen atorvastatin (LIPITOR) 80 MG tablet TAKE 1 TABLET AT 6PM. 90 tablet 3  . cetirizine (ZYRTEC) 10 MG tablet Take 10 mg by mouth daily.    . clobetasol cream (TEMOVATE) 0.05 % APPLY NIGHTLY AS DIRECTED. 30 g 1  . Coenzyme Q10 (COQ10) 200 MG CAPS One a day 100 capsule 0  . DULoxetine (CYMBALTA) 60 MG capsule TAKE 1 CAPSULE DAILY. 90 capsule 1  . ELIQUIS 2.5 MG TABS tablet TAKE 1 TABLET BY MOUTH TWICE DAILY. 180 tablet 0  . esomeprazole (NEXIUM) 40 MG capsule TAKE (1) CAPSULE DAILY. 90 capsule 1  . hyoscyamine (LEVSIN) 0.125 MG tablet Take 1-2 tablets (0.125-0.25 mg total) by mouth every 4 (four) hours as needed for up to 10 days for cramping. 100 tablet 3  . LORazepam (ATIVAN) 1 MG tablet Take 1 tablet (1 mg total) by mouth 2 (two) times daily as needed for anxiety or sleep.  180 tablet 1  . metoprolol succinate (TOPROL-XL) 25 MG 24 hr tablet TAKE 3 TABLETS DAILY WITH OR IMMEDIATELY FOLLOWING A MEAL. 270 tablet 3  . nitroGLYCERIN (NITROSTAT) 0.4 MG SL tablet Place 0.4 mg under the tongue every 5 (five) minutes as needed for chest pain (Call 911 at 3rd dose within 15 minutes.).    Marland Kitchen NONFORMULARY OR COMPOUNDED ITEM Estradiol 0.02% vag cream prefilled applicators  S: insert appful (1 gram) intravaginally twice weekly. 24 each 3  . Probiotic Product (ALIGN PO) Take 1 capsule by mouth daily.    . sodium chloride (OCEAN) 0.65 % SOLN nasal spray Place 1 spray into both nostrils as needed for congestion.    . sulfamethoxazole-trimethoprim (BACTRIM DS) 800-160 MG tablet Take 1 tablet by mouth 2 (two)  times daily for 5 days. 10 tablet 0  . valACYclovir (VALTREX) 500 MG tablet Take one tablet by mouth twice daily for 5 days for early onset symptoms. 30 tablet 1   No current facility-administered medications on file prior to visit.    Past Surgical History:  Procedure Laterality Date  . ANTERIOR AND POSTERIOR VAGINAL REPAIR  01/2002   Archie Endo 01/23/2011  . APPENDECTOMY  1948  . CARDIAC CATHETERIZATION  06/26/2017  . CORONARY ANGIOPLASTY WITH STENT PLACEMENT  07/01/2017  . CORONARY ATHERECTOMY N/A 07/01/2017   Procedure: CORONARY ATHERECTOMY;  Surgeon: Belva Crome, MD;  Location: [facility];  Service: Cardiovascular;  Laterality: N/A;  . CORONARY STENT INTERVENTION N/A 07/01/2017   Procedure: CORONARY STENT INTERVENTION;  Surgeon: Belva Crome, MD;  Location: [facility];  Service: Cardiovascular;  Laterality: N/A;  . HAMMER TOE SURGERY    . HEMORRHOID BANDING    . HIP SURGERY Left 04/2009   hip examination under anesthesia followed by greater trochanteric bursectomy; iliotibial band tenotomy/notes 01/20/2011  . I & D EXTREMITY Right 01/10/2018   Procedure: IRRIGATION AND DEBRIDEMENT RIGHT KNEE, APPLY WOUND VAC;  Surgeon: Newt Minion, MD;  Location: [facility];  Service: Orthopedics;  Laterality: Right;  . KNEE BURSECTOMY Right 04/2009   Archie Endo 01/09/2011  . LEFT HEART CATH AND CORONARY ANGIOGRAPHY N/A 06/26/2017   Procedure: LEFT HEART CATH AND CORONARY ANGIOGRAPHY;  Surgeon: Belva Crome, MD;  Location: [facility];  Service: Cardiovascular;  Laterality: N/A;  . PUBOVAGINAL SLING  01/2002   Archie Endo 01/23/2011  . REDUCTION MAMMAPLASTY    . TEMPORARY PACEMAKER N/A 07/01/2017   Procedure: TEMPORARY PACEMAKER;  Surgeon: Belva Crome, MD;  Location: [facility];  Service: Cardiovascular;  Laterality: N/A;  . VAGINAL HYSTERECTOMY  01/2002   Vaginal hysterectomy, bilateral salpingo-oophorectomy/notes 01/23/2011    Allergies  Allergen Reactions  . Macrobid  [employer] Macro] Other (See Comments)    Nausea, stomach cramps, fatigue , headache.  . Meloxicam Other (See Comments)    Jittery and headache  . Digoxin And Related     headaches  . Mobic [Meloxicam]   . Sulfamethoxazole-Trimethoprim Nausea Only    Social History   Socioeconomic History  . Marital status: Married    Spouse name: Not on file  . Number of children: 2  . Years of education: Not on file  . Highest education level: Not on file  Occupational History  . Occupation: Patent attorney: RETIRED  Tobacco Use  . Smoking status: Former Smoker    Packs/day: 0.25    Years: 28.00    Pack years: 7.00    Types: Cigarettes  Quit date: 86    Years since quitting: 40.3  . Smokeless tobacco: Never Used  Substance and Sexual Activity  . Alcohol use: Yes    Alcohol/week: 7.0 standard drinks    Types: 7 Standard drinks or equivalent per week    Comment: 7 vodka drinks a week  . Drug use: No  . Sexual activity: Yes    Birth control/protection: Surgical    Comment: 1st intercourse 85 yo--Fewer than 5 partners  Other Topics Concern  . Not on file  Social History Narrative   Regular Exercise -  YES         Social Determinants of Health   Financial Resource Strain:   . Difficulty of Paying Living Expenses:   Food Insecurity:   . Worried About Charity fundraiser in the Last Year:   . Arboriculturist in the Last Year:   Transportation Needs:   . Film/video editor (Medical):   Marland Kitchen Lack of Transportation (Non-Medical):   Physical Activity:   . Days of Exercise per Week:   . Minutes of Exercise per Session:   Stress:   . Feeling of Stress :   Social Connections:   . Frequency of Communication with Friends and Family:   . Frequency of Social Gatherings with Friends and Family:   . Attends Religious Services:   . Active Member of Clubs or Organizations:   . Attends Archivist Meetings:   Marland Kitchen Marital Status:   Intimate Partner  Violence:   . Fear of Current or Ex-Partner:   . Emotionally Abused:   Marland Kitchen Physically Abused:   . Sexually Abused:     Family History  Problem Relation Age of Onset  . Colon cancer Mother   . Diabetes Father   . Prostate cancer Father   . Prostate cancer Brother   . Pancreatic cancer Brother   . Stomach cancer Son   . Heart attack Neg Hx   . Stroke Neg Hx   . Esophageal cancer Neg Hx         Objective:  Physical Exam: BP (!) 143/76   Ht 5\' 4"  (1.626 m)   Wt 154 lb (69.9 kg)   BMI 26.43 kg/m  Gen: NAD, comfortable in exam room Lungs: Breathing comfortably on room air Hip Exam Left -Inspection: No deformity, no discoloration -Palpation: Marked tenderness palpation over the greater trochanter.  No tenderness palpation lower back. -ROM: Reduced range of motion of the hip in all planes with flexion, internal and external rotation. -Strength: Flexion: 4/5; Abduction: 3/5 -Special Tests: Obers: Negative; FABER: Negative; FADIR: Negative; Log roll: Negative -Limb neurovascularly intact     Assessment & Plan:  Patient is a 85 y.o. female here for follow-up of left hip pain  1.  Left hip greater trochanteric pain syndrome -Consent was obtained for corticosteroid injection left greater trochanteric bursa.  Timeout was performed prior to the procedure.  The skin was cleaned and prepped using alcohol swabs.  Following this 80 mg of Depo-Medrol and 4 cc of lidocaine were injected into the bursa using sterile technique.  Patient tolerated the injection well there are no complications -Patient was referred to physical therapy to work on hip abduction strengthening  Patient will follow up in 4 to 6 weeks after starting physical therapy   I observed and examined the patient with Dr. Sheppard Coil and agree with assessment and plan.  Note reviewed and modified by me. Ila Mcgill, MD

## 2020-01-21 NOTE — Patient Instructions (Signed)
Your hip is caused by inflammation of the bursa and the tendons that attach to greater trochanter.  This is known as greater trochanteric pain syndrome -The steroid injection given to you today may take several days before it has an effect.  You may be a little more sore for the next day or 2 until the steroids kick in. -We will send you to physical therapist to work on your hip strengthening  I would like to see you back after doing 4 weeks of physical therapy

## 2020-01-27 DIAGNOSIS — M7601 Gluteal tendinitis, right hip: Secondary | ICD-10-CM | POA: Diagnosis not present

## 2020-01-27 DIAGNOSIS — M25552 Pain in left hip: Secondary | ICD-10-CM | POA: Diagnosis not present

## 2020-01-27 DIAGNOSIS — M25551 Pain in right hip: Secondary | ICD-10-CM | POA: Diagnosis not present

## 2020-01-27 DIAGNOSIS — M545 Low back pain: Secondary | ICD-10-CM | POA: Diagnosis not present

## 2020-01-29 ENCOUNTER — Telehealth: Payer: Self-pay

## 2020-01-29 ENCOUNTER — Other Ambulatory Visit: Payer: Self-pay | Admitting: Sports Medicine

## 2020-01-29 ENCOUNTER — Other Ambulatory Visit: Payer: Self-pay

## 2020-01-29 MED ORDER — TRAMADOL HCL 50 MG PO TABS: 50.0000 mg | ORAL_TABLET | Freq: Two times a day (BID) | ORAL | 0 refills | Status: DC | PRN

## 2020-01-29 MED ORDER — PREDNISONE 10 MG (21) PO TBPK: ORAL_TABLET | Freq: Every day | ORAL | 0 refills | Status: DC

## 2020-01-29 NOTE — Telephone Encounter (Signed)
Told pt it is unlikely the injection caused her this much pain as that was 8 days ago. She had PT yesterday and woke up in a lot of pain this morning. For pain we called in a short course of tramadol and a prednisone dose pack. She will f/u with Dr. Oneida Alar next week. Pt understands and agrees with the plan.

## 2020-02-02 ENCOUNTER — Ambulatory Visit (INDEPENDENT_AMBULATORY_CARE_PROVIDER_SITE_OTHER): Payer: Medicare Other | Admitting: Sports Medicine

## 2020-02-02 ENCOUNTER — Ambulatory Visit: Payer: Self-pay

## 2020-02-02 ENCOUNTER — Other Ambulatory Visit: Payer: Self-pay

## 2020-02-02 VITALS — BP 149/87 | Ht 64.0 in | Wt 154.0 lb

## 2020-02-02 DIAGNOSIS — M7061 Trochanteric bursitis, right hip: Secondary | ICD-10-CM

## 2020-02-02 DIAGNOSIS — M25552 Pain in left hip: Secondary | ICD-10-CM

## 2020-02-02 DIAGNOSIS — M7062 Trochanteric bursitis, left hip: Secondary | ICD-10-CM

## 2020-02-02 NOTE — Progress Notes (Signed)
Chief complaint left lateral hip pain  Patient had a greater trochanteric injection about 2 weeks ago Her pain was reduced and so she went to physical therapy after 10 days After 1 day of physical therapy she had significant pain and could not walk for 24 hours We called her in a prednisone Dosepak and she feels somewhat better She comes for my evaluation  Review of systems No fever or chills No sciatica to the left leg  Physical exam Pleasant older female in no acute distress BP (!) 149/87   Ht 5\' 4"  (1.626 m)   Wt 154 lb (69.9 kg)   BMI 26.43 kg/m   Palpation of the greater trochanter reveals direct pain Palpation down the iliotibial band reveals pain for about 6 to 7 cm below the greater trochanter She does have full range of motion of the hip She still has abduction weakness  Ultrasound of the lateral hip left  There is a spur at the inferior border of the greater trochanter There is hypoechoic swelling dissecting about 6 to 7 cm below the greater trochanter On transverse view this extends around from the position of the spur both anteriorly and posteriorly as a hypoechoic swelling Her iliotibial band is intact Her gluteus medius and minimus tendons are intact  Impression greater trochanteric pain syndrome with a spur and a secondary hematoma affecting the soft tissue  Ultrasound and interpretation by Wolfgang Phoenix. Oneida Alar, MD

## 2020-02-02 NOTE — Assessment & Plan Note (Signed)
With her current issues with left greater trochanteric pain we wanted to give her an exercise program after her injection  However because of the spurring at the inferior trochanter I think she is actually cutting into the iliotibial band and may be some of the tendinous tissue She now has a hematoma  We will do icing Decreased activity primarily to emphasize walking Because of the spurring her future rehab may have to be in the water We may have to rely more on walking activities including some lateral and crossover walking

## 2020-02-04 ENCOUNTER — Other Ambulatory Visit: Payer: Self-pay | Admitting: Internal Medicine

## 2020-02-09 ENCOUNTER — Telehealth: Payer: Self-pay | Admitting: Interventional Cardiology

## 2020-02-09 ENCOUNTER — Other Ambulatory Visit: Payer: Self-pay

## 2020-02-09 NOTE — Telephone Encounter (Signed)
New Message  Patient c/o Palpitations:  High priority if patient c/o lightheadedness, shortness of breath, or chest pain  1) How long have you had palpitations/irregular HR/ Afib? Are you having the symptoms now? Several years, very tired  2) Are you currently experiencing lightheadedness, SOB or CP? lightheadedness  3) Do you have a history of afib (atrial fibrillation) or irregular heart rhythm? Yes   4) Have you checked your BP or HR? (document readings if available): No  5) Are you experiencing any other symptoms? No

## 2020-02-09 NOTE — Telephone Encounter (Signed)
Pt has noticed increased fatigue and heavy breathing over the last several days.  Over the weekend was trying to clean out a closet and had to stop every 15 mins to rest and get her energy back up.  Has noticed she has been "breathing heavily" at time.  States she wouldn't really call it SOB.  Has an Apple Watch and when she checks, she is always in AF.  HRs normally in the 60s but while in the shower today it was 120.  Scheduled pt to come in and see Kathyrn Drown, NP Thursday at 3:15pm.  Pt appreciative for call.

## 2020-02-10 ENCOUNTER — Ambulatory Visit (INDEPENDENT_AMBULATORY_CARE_PROVIDER_SITE_OTHER): Payer: Medicare Other | Admitting: Obstetrics and Gynecology

## 2020-02-10 ENCOUNTER — Encounter: Payer: Self-pay | Admitting: Obstetrics and Gynecology

## 2020-02-10 ENCOUNTER — Ambulatory Visit (INDEPENDENT_AMBULATORY_CARE_PROVIDER_SITE_OTHER): Payer: Medicare Other | Admitting: Plastic Surgery

## 2020-02-10 VITALS — BP 118/76 | Ht 63.0 in | Wt 158.0 lb

## 2020-02-10 VITALS — BP 114/77 | HR 72 | Temp 97.7°F | Ht 63.0 in | Wt 156.8 lb

## 2020-02-10 DIAGNOSIS — M8588 Other specified disorders of bone density and structure, other site: Secondary | ICD-10-CM | POA: Diagnosis not present

## 2020-02-10 DIAGNOSIS — N952 Postmenopausal atrophic vaginitis: Secondary | ICD-10-CM | POA: Diagnosis not present

## 2020-02-10 DIAGNOSIS — Z780 Asymptomatic menopausal state: Secondary | ICD-10-CM

## 2020-02-10 DIAGNOSIS — R82998 Other abnormal findings in urine: Secondary | ICD-10-CM | POA: Diagnosis not present

## 2020-02-10 DIAGNOSIS — Z01419 Encounter for gynecological examination (general) (routine) without abnormal findings: Secondary | ICD-10-CM | POA: Diagnosis not present

## 2020-02-10 DIAGNOSIS — L905 Scar conditions and fibrosis of skin: Secondary | ICD-10-CM

## 2020-02-10 DIAGNOSIS — L900 Lichen sclerosus et atrophicus: Secondary | ICD-10-CM

## 2020-02-10 DIAGNOSIS — N898 Other specified noninflammatory disorders of vagina: Secondary | ICD-10-CM | POA: Diagnosis not present

## 2020-02-10 MED ORDER — CLOBETASOL PROPIONATE 0.05 % EX CREA: TOPICAL_CREAM | CUTANEOUS | 1 refills | Status: DC

## 2020-02-10 MED ORDER — ESTRADIOL 0.1 MG/GM VA CREA: 1.0000 | TOPICAL_CREAM | Freq: Every day | VAGINAL | 12 refills | Status: DC

## 2020-02-10 NOTE — Progress Notes (Signed)
Hailey [name] 1935-07-24 [id number]  SUBJECTIVE:  85 y.o. G22P2001 female here for an annual routine gynecologic exam.  He was experiencing some vaginal discomfort and irritation, she was treated for UTI about 6 weeks ago which helped the symptoms a little bit but she somewhat continues to have the irritation.  No concerns about discharge or lesions.  She uses clobetasol as needed for vaginal/vulvar itching due to lichen sclerosus. She otherwise has no gynecologic concerns.  Current Outpatient Medications  Medication Sig Dispense Refill  . acetaminophen (TYLENOL) 500 MG tablet Take 1,000 mg by mouth every 6 (six) hours as needed (Coated or capsules).    Marland Kitchen atorvastatin (LIPITOR) 80 MG tablet TAKE 1 TABLET AT 6PM. 90 tablet 3  . cetirizine (ZYRTEC) 10 MG tablet Take 10 mg by mouth daily.    . clobetasol cream (TEMOVATE) 0.05 % APPLY NIGHTLY AS DIRECTED. 30 g 1  . Coenzyme Q10 (COQ10) 200 MG CAPS One a day 100 capsule 0  . DULoxetine (CYMBALTA) 60 MG capsule TAKE 1 CAPSULE DAILY. 90 capsule 1  . ELIQUIS 2.5 MG TABS tablet TAKE 1 TABLET BY MOUTH TWICE DAILY. 180 tablet 0  . esomeprazole (NEXIUM) 40 MG capsule TAKE (1) CAPSULE DAILY. 90 capsule 1  . LORazepam (ATIVAN) 1 MG tablet TAKE 1 TABLET TWICE DAILY AS NEEDED FOR ANXIETY/SLEEP. 180 tablet 0  . metoprolol succinate (TOPROL-XL) 25 MG 24 hr tablet TAKE 3 TABLETS DAILY WITH OR IMMEDIATELY FOLLOWING A MEAL. 270 tablet 3  . nitroGLYCERIN (NITROSTAT) 0.4 MG SL tablet Place 0.4 mg under the tongue every 5 (five) minutes as needed for chest pain (Call 911 at 3rd dose within 15 minutes.).    Marland Kitchen predniSONE (STERAPRED UNI-PAK 21 TAB) 10 MG (21) TBPK tablet Take by mouth daily. 1 tablet 0  . Probiotic Product (ALIGN PO) Take 1 capsule by mouth daily.    . sodium chloride (OCEAN) 0.65 % SOLN nasal spray Place 1 spray into both nostrils as needed for congestion.    . traMADol (ULTRAM) 50 MG tablet Take 1 tablet (50 mg total) by mouth 2 (two) times daily as  needed. 10 tablet 0  . valACYclovir (VALTREX) 500 MG tablet Take one tablet by mouth twice daily for 5 days for early onset symptoms. 30 tablet 1  . hyoscyamine (LEVSIN) 0.125 MG tablet Take 1-2 tablets (0.125-0.25 mg total) by mouth every 4 (four) hours as needed for up to 10 days for cramping. 100 tablet 3  . NONFORMULARY OR COMPOUNDED ITEM Estradiol 0.02% vag cream prefilled applicators  S: insert appful (1 gram) intravaginally twice weekly. (Patient not taking: Reported on 02/10/2020) 24 each 3   No current facility-administered medications for this visit.   Allergies: Macrobid [nitrofurantoin monohyd macro], Meloxicam, Digoxin and related, Mobic [meloxicam], and Sulfamethoxazole-trimethoprim  No LMP recorded. Patient has had a hysterectomy.  Past medical history,surgical history, problem list, medications, allergies, family history and social history were all reviewed and documented as reviewed in the EPIC chart.  ROS:  Feeling well. No dyspnea or chest pain on exertion.  No abdominal pain, change in bowel habits, black or bloody stools.  No urinary tract symptoms. GYN ROS: no abnormal bleeding, pelvic pain or discharge, no breast pain or new or enlarging lumps on self exam. No neurological complaints.   OBJECTIVE:  BP 118/76   Ht 5\' 3"  (1.6 m)   Wt 158 lb (71.7 kg)   BMI 27.99 kg/m  The patient appears well, alert, oriented x 3, in no  distress. ENT normal.  Neck supple. No cervical or supraclavicular adenopathy or thyromegaly.  Lungs are clear, good air entry, no wheezes, rhonchi or rales. S1 and S2 normal, no murmurs, regular rate and rhythm.  Abdomen soft without tenderness, guarding, mass or organomegaly.  Neurological is normal, no focal findings.  BREAST EXAM: breasts appear normal, no suspicious masses, no skin or nipple changes or axillary nodes  PELVIC EXAM: VULVA: normal appearing vulva with no masses, tenderness or lesions, atrophic changes with blanching of perineal body,  posterior fourchette, bilateral labia minora, consistent with appearance of lichen sclerosus.  VAGINA: normal appearing vagina with normal color and discharge, no lesions, CERVIX: surgically absent, UTERUS: surgically absent, vaginal cuff normal, ADNEXA: no masses, non tender.  Chaperone: [name] present during the examination  ASSESSMENT:  85 y.o. G2P2001 here for annual gynecologic exam  PLAN:   1. Postmenopausal with vaginal atrophy. Prior TVH BSO.  I suspect her vaginal and vulvar irritation are due to vulvovaginal atrophy so we discussed restarting vaginal estrogen cream today.  UA indicates many WBC but this may be contamination as other markers are not as positive for UTI, we will wait for the culture result before trying antibiotics since she says this can be a little hard on her stomach.  I gave her a prescription for Estrace 0.1 mg/g.  She will use this nightly for 1 month and then continue twice weekly.  We discussed risks of systemic absorption to include increased thrombotic risk and breast cancer issue.  Benefits include decreasing UTI frequency and it can also help with vaginal irritation. 2. Pap smear 2011.  No significant history of abnormal Pap smears.  Not continuing Pap smears given prior hysterectomy and age criteria based on most recent guidelines. 3. History lichen sclerosus.  Uses clobetasol cream as needed, refill is provided. 4. Mammogram 12/2019.  Normal breast exam today.  Tinea with annual mammograms. 5. Colonoscopy 2013.  Recommended that she follow up at the recommended interval.   6. Osteopenia.  DEXA 11/2016 T score -2.0 FRAX 15% / 4.3%.  Had declined treatment in the past for elevated hip FRAX.    She did have a fall in December and did not have any fractures.  Plan repeat DEXA now as she did not complete this at the 2-year interval that had been recommended last year.  She will plan to schedule this at checkout. 7. Genital HSV-1 diagnosed 2020. No active lesions or  symptoms. Declines need for Valtrex refill at this time. 8. Health maintenance.  No labs today as she normally has these completed swear.  Return annually or sooner, prn.  Joseph Pierini MD 02/10/20

## 2020-02-10 NOTE — Progress Notes (Signed)
Cardiology Office Note   Date:  02/11/2020   ID:  OREDA MADL, DOB 06-Sep-1935, MRN [id number]  PCP:  Cassandria Anger, MD  Cardiologist:  Dr. Tamala Julian, MD   Chief Complaint  Patient presents with  . Atrial Fibrillation   History of Present Illness: Hailey [name] is a 85 y.o. female who presents for follow up, seen for Dr. Tamala Julian.   [name] has a hx of PSVT, paroxysmal atrial fibrillationonchronic anticoagulation,abnormal prior myocardial perfusion imaging 2017, CAD with PCI DES of RCA 2018,and hypertension.  She was last seen by Dr. Tamala Julian 10/06/2019 and was doing well from a CV standpoint.   Pt called the office 02/09/20 with noticed increased fatigue and heavy breathing over the last several days. She reported that over the weekend she was trying to clean her house and needed to stop and rest every 15 min to rest secondary to fatigue. Has had some exertional SOB as well. She wears an Visual merchandiser and reports that when she checks, it will indicate AF. HRs normally in the 60s but while in the shower on day of call, it was noted to be in the 120 range.   Today she presents with her husband and states her symptoms began approximately 6 weeks ago. She states that activities which used to be easy for her have now become difficult secondary to SOB and fatigue. She states she has not had anginal symptoms, palpitations, orthopnea, LE edema, N/V, diaphoresis, dizziness or syncope. EKG today shows atrial fibrillation with rate control. Initially there was concern if AF was causing her symptoms however it appears that she recently wore a monitor which showed continuous AF>>she did not have these symptoms at that time. I do have concern that she may have progressive CAD therefore will obtain echocardiogram to assess LV function and WM. Case discussed with Dr. Tamala Julian    Past Medical History:  Diagnosis Date  . Acute blood loss anemia 12/25/2017  . Allergic rhinitis   . Anxiety   .  Arthritis    "my whole spine" (07/01/2017)  . Atrial fibrillation ([place])   . Bowel obstruction ([place])    in [place]  . Bradycardia with 41-50 beats per minute 12/27/2017  . Cellulitis of leg, right with large prepatella hematoma and open wounds 12/26/2017  . CKD (chronic kidney disease) stage 2, GFR 60-89 ml/min   . Colon polyps   . Coronary artery disease    10/18 PCI/DES to p/m LCx with cutting balloon to mLcx  . Diverticulosis of colon   . GERD (gastroesophageal reflux disease)   . Hip bursitis 2010   Dr Para March, Post op seroma  . History of colon polyps   . HSV (herpes simplex virus) anogenital infection 07/2019  . HTN (hypertension)   . IBS (irritable bowel syndrome)    constipation predominant - Dr Earlean Shawl  . Lichen sclerosus   . Osteopenia 11/2016   T score -2.0 FRAX 15%/4.3%  . PAC (premature atrial contraction)    Symptomatiic  . Right bundle branch block (RBBB) on electrocardiogram (ECG) 12/27/2017  . Scoliosis   . SVT (supraventricular tachycardia) ([place])    brief history    Past Surgical History:  Procedure Laterality Date  . ANTERIOR AND POSTERIOR VAGINAL REPAIR  01/2002   Archie Endo 01/23/2011  . APPENDECTOMY  1948  . CARDIAC CATHETERIZATION  06/26/2017  . CORONARY ANGIOPLASTY WITH STENT PLACEMENT  07/01/2017  . CORONARY ATHERECTOMY N/A 07/01/2017   Procedure: CORONARY ATHERECTOMY;  Surgeon:  Belva Crome, MD;  Location: [facility];  Service: Cardiovascular;  Laterality: N/A;  . CORONARY STENT INTERVENTION N/A 07/01/2017   Procedure: CORONARY STENT INTERVENTION;  Surgeon: Belva Crome, MD;  Location: [facility];  Service: Cardiovascular;  Laterality: N/A;  . HAMMER TOE SURGERY    . HEMORRHOID BANDING    . HIP SURGERY Left 04/2009   hip examination under anesthesia followed by greater trochanteric bursectomy; iliotibial band tenotomy/notes 01/20/2011  . I & D EXTREMITY Right 01/10/2018   Procedure: IRRIGATION AND DEBRIDEMENT RIGHT KNEE, APPLY WOUND VAC;   Surgeon: Newt Minion, MD;  Location: [facility];  Service: Orthopedics;  Laterality: Right;  . KNEE BURSECTOMY Right 04/2009   Archie Endo 01/09/2011  . LEFT HEART CATH AND CORONARY ANGIOGRAPHY N/A 06/26/2017   Procedure: LEFT HEART CATH AND CORONARY ANGIOGRAPHY;  Surgeon: Belva Crome, MD;  Location: [facility];  Service: Cardiovascular;  Laterality: N/A;  . PUBOVAGINAL SLING  01/2002   Archie Endo 01/23/2011  . REDUCTION MAMMAPLASTY    . TEMPORARY PACEMAKER N/A 07/01/2017   Procedure: TEMPORARY PACEMAKER;  Surgeon: Belva Crome, MD;  Location: [facility];  Service: Cardiovascular;  Laterality: N/A;  . VAGINAL HYSTERECTOMY  01/2002   Vaginal hysterectomy, bilateral salpingo-oophorectomy/notes 01/23/2011     Current Outpatient Medications  Medication Sig Dispense Refill  . acetaminophen (TYLENOL) 500 MG tablet Take 1,000 mg by mouth every 6 (six) hours as needed (Coated or capsules).    Marland Kitchen atorvastatin (LIPITOR) 80 MG tablet TAKE 1 TABLET AT 6PM. 90 tablet 3  . cetirizine (ZYRTEC) 10 MG tablet Take 10 mg by mouth daily.    . clobetasol cream (TEMOVATE) 0.05 % APPLY NIGHTLY AS DIRECTED. 60 g 1  . Coenzyme Q10 (COQ10) 200 MG CAPS One a day 100 capsule 0  . DULoxetine (CYMBALTA) 60 MG capsule TAKE 1 CAPSULE DAILY. 90 capsule 1  . ELIQUIS 2.5 MG TABS tablet TAKE 1 TABLET BY MOUTH TWICE DAILY. 180 tablet 0  . esomeprazole (NEXIUM) 40 MG capsule TAKE (1) CAPSULE DAILY. 90 capsule 1  . estradiol (ESTRACE VAGINAL) 0.1 MG/GM vaginal cream Place 1 Applicatorful vaginally at bedtime. After 1 month of nightly use, decrease use to 2 nights per week 42.5 g 12  . LORazepam (ATIVAN) 1 MG tablet TAKE 1 TABLET TWICE DAILY AS NEEDED FOR ANXIETY/SLEEP. 180 tablet 0  . metoprolol succinate (TOPROL-XL) 25 MG 24 hr tablet TAKE 3 TABLETS DAILY WITH OR IMMEDIATELY FOLLOWING A MEAL. 270 tablet 3  . nitroGLYCERIN (NITROSTAT) 0.4 MG SL tablet Place 0.4 mg under the tongue every 5 (five) minutes as needed for  chest pain (Call 911 at 3rd dose within 15 minutes.).    Marland Kitchen NONFORMULARY OR COMPOUNDED ITEM Estradiol 0.02% vag cream prefilled applicators  S: insert appful (1 gram) intravaginally twice weekly. 24 each 3  . predniSONE (STERAPRED UNI-PAK 21 TAB) 10 MG (21) TBPK tablet Take by mouth daily. 1 tablet 0  . Probiotic Product (ALIGN PO) Take 1 capsule by mouth daily.    . sodium chloride (OCEAN) 0.65 % SOLN nasal spray Place 1 spray into both nostrils as needed for congestion.    . traMADol (ULTRAM) 50 MG tablet Take 1 tablet (50 mg total) by mouth 2 (two) times daily as needed. 10 tablet 0  . valACYclovir (VALTREX) 500 MG tablet Take one tablet by mouth twice daily for 5 days for early onset symptoms. 30 tablet 1  . hyoscyamine (LEVSIN) 0.125 MG  tablet Take 1-2 tablets (0.125-0.25 mg total) by mouth every 4 (four) hours as needed for up to 10 days for cramping. 100 tablet 3   No current facility-administered medications for this visit.    Allergies:   Macrobid [nitrofurantoin monohyd macro], Meloxicam, Digoxin and related, Mobic [meloxicam], and Sulfamethoxazole-trimethoprim    Social History:  The patient  reports that she quit smoking about 40 years ago. Her smoking use included cigarettes. She has a 7.00 pack-year smoking history. She has never used smokeless tobacco. She reports current alcohol use of about 7.0 standard drinks of alcohol per week. She reports that she does not use drugs.   Family History:  The patient's family history includes Colon cancer in her mother; Diabetes in her father; Pancreatic cancer in her brother; Prostate cancer in her brother and father; Stomach cancer in her son.    ROS:  Please see the history of present illness.   Otherwise, review of systems are positive for none. All other systems are reviewed and negative.    PHYSICAL EXAM: VS:  BP 110/80   Pulse 65   Ht 5\' 3"  (1.6 m)   Wt 156 lb 1.9 oz (70.8 kg)   SpO2 99%   BMI 27.66 kg/m  , BMI Body mass index  is 27.66 kg/m.   General: Well developed, well nourished, NAD Neck: Negative for carotid bruits. No JVD Lungs:Clear to ausculation bilaterally. No wheezes, rales, or rhonchi. Breathing is unlabored. Cardiovascular: Irregularly irregular with S1 S2. No murmurs, rubs, gallops, or LV heave appreciated. Extremities: No edema. Radial pulses 2+ bilaterally Neuro: Alert and oriented. No focal deficits. No facial asymmetry. MAE spontaneously. Psych: Responds to questions appropriately with normal affect.    EKG:  EKG is ordered today. The ekg ordered today demonstrates atrial fibrillation with RBBB and rate control at 65 bpm   Recent Labs: 12/02/2019: ALT 12; BUN 22; Creatinine 1.03; Hemoglobin 10.9; Platelets 283; Potassium 4.5; Sodium 137    Lipid Panel    Component Value Date/Time   CHOL 171 11/26/2016 0844   TRIG 99 11/26/2016 0844   TRIG 183 (H) 07/26/2006 0903   HDL 90 11/26/2016 0844   CHOLHDL 1.9 11/26/2016 0844   CHOLHDL 2.2 07/04/2014 0528   VLDL 19 07/04/2014 0528   LDLCALC 61 11/26/2016 0844   LDLDIRECT 89.1 06/29/2013 1604      Wt Readings from Last 3 Encounters:  02/11/20 156 lb 1.9 oz (70.8 kg)  02/10/20 158 lb (71.7 kg)  02/10/20 156 lb 12.8 oz (71.1 kg)      Other studies Reviewed: Additional studies/ records that were reviewed today include:   Monitor 04/29/19:  Continuous atrial fibrillation and flutter  Coronary stent intervention 06/2017:   Dist RCA lesion, 50 %stenosed.    Successful PCI with Orbital atherectomy followed by stenting of a heavily calcified right coronary reducing a proximal to mid 85% stenosis to 0% with with TIMI grade 3 flow. An Onyx 4.0 x 26 mm DES was post.-dilated to 4.5 mm in diameter.  RECOMMENDATIONS:   Aspirin, Plavix, and Eliquis for 1 month then drop aspirin.  Plavix and Eliquis x 5 months then drop plavix.  Resume Eliquis in AM 07/02/2017.  LHC 06/26/2017:  Severe diffuse three-vessel coronary calcification,  particularly have in the right coronary artery.  85-90% proximal followed by generalized 50-60% mid RCA calcified stenosis.  Distal left main 25%. Heavily calcified.  Irregularities in the proximal and mid LAD up to 50% . Heavily calcified.  Circumflex artery is  relatively small and is free of any significant obstruction.   Normal left ventricular systolic function with normal left ventricular end-diastolic pressure. EF 60%.  RECOMMENDATIONS:   Plavix is started today.  Resume Eliquis in a.m. Discontinue eloquent's after p.m. dose on Friday.  Orbital atherectomy will be planned for 07/01/2017 probably from right femoral approach with temporary pacemaker insertion to avoid bradycardia.   ASSESSMENT AND PLAN:  1.  Shortness of breath/exertional fatigue with known history of CAD with RCA stenting in 2018: -Six week hx of increased dyspnea and fatigue with activities which used to be easy for her. Initially felt to be secondary to atrial fibrillation however looking back at her history, she has been in permanent atrial fibrillation for quite some time therefore there is concern for progressive CAD. Case discussed with Dr. Tamala Julian who recommends obtaining echocardiogram to further assess LV function and wall motion.   -Obtain lab work including BNP -Reassured with recent Lexiscan stress test last year with no ischemia or infarct -Continue current regimen for now>> will adjust medications based on echocardiogram results  2. HTN: -Stable>> no changes   3. Permanent atrial fibrillation: -Rate controlled>> on beta-blocker -Discussed [facility] at last OV>>>referred to EP  -Continue  -AC with low dose Eliquis secondary to age and renal insufficiency  -Once stabilized from above issue, may refer to EP for further evaluation  4. HLD: -Goal LDL <70 -Continue statin  5. Factor XI deficiency: -Hematologist discussed at last OV   Current medicines are reviewed at length with the patient  today.  The patient does not have concerns regarding medicines.  The following changes have been made:  no change  Labs/ tests ordered today include: Echocardiogram  Orders Placed This Encounter  Procedures  . Basic metabolic panel  . Pro b natriuretic peptide (BNP)  . EKG 12-Lead  . ECHOCARDIOGRAM COMPLETE    Disposition:   FU with Dr. Tamala Julian after echocardiogram   Signed, Kathyrn Drown, NP  02/11/2020 [facility] [address], [place], [place]  [address] Phone: [phone]; Fax: [phone]

## 2020-02-10 NOTE — Progress Notes (Signed)
Referring Provider Plotnikov, Evie Lacks, MD [place],  [place] [id number]   CC:  Chief Complaint  Patient presents with  . Consult    scar on forehead      Hailey [name] is an 85 y.o. female.  HPI: Patient presents to discuss her forehead scar.  She fell back in December and her glasses caused a laceration/avulsion type injury across her glabellar area of her forehead.  This was sutured in the emergency room at that time.  She is since been unhappy with the appearance of the scar would like me to evaluate it.  Allergies  Allergen Reactions  . Macrobid [employer] Macro] Other (See Comments)    Nausea, stomach cramps, fatigue , headache.  . Meloxicam Other (See Comments)    Jittery and headache  . Digoxin And Related     headaches  . Mobic [Meloxicam]   . Sulfamethoxazole-Trimethoprim Nausea Only    Outpatient Encounter Medications as of 02/10/2020  Medication Sig  . acetaminophen (TYLENOL) 500 MG tablet Take 1,000 mg by mouth every 6 (six) hours as needed (Coated or capsules).  Marland Kitchen atorvastatin (LIPITOR) 80 MG tablet TAKE 1 TABLET AT 6PM.  . cetirizine (ZYRTEC) 10 MG tablet Take 10 mg by mouth daily.  . clobetasol cream (TEMOVATE) 0.05 % APPLY NIGHTLY AS DIRECTED.  . Coenzyme Q10 (COQ10) 200 MG CAPS One a day  . DULoxetine (CYMBALTA) 60 MG capsule TAKE 1 CAPSULE DAILY.  Marland Kitchen ELIQUIS 2.5 MG TABS tablet TAKE 1 TABLET BY MOUTH TWICE DAILY.  Marland Kitchen esomeprazole (NEXIUM) 40 MG capsule TAKE (1) CAPSULE DAILY.  . hyoscyamine (LEVSIN) 0.125 MG tablet Take 1-2 tablets (0.125-0.25 mg total) by mouth every 4 (four) hours as needed for up to 10 days for cramping.  Marland Kitchen LORazepam (ATIVAN) 1 MG tablet TAKE 1 TABLET TWICE DAILY AS NEEDED FOR ANXIETY/SLEEP.  Marland Kitchen metoprolol succinate (TOPROL-XL) 25 MG 24 hr tablet TAKE 3 TABLETS DAILY WITH OR IMMEDIATELY FOLLOWING A MEAL.  . nitroGLYCERIN (NITROSTAT) 0.4 MG SL tablet Place 0.4 mg under the tongue every 5 (five) minutes as needed  for chest pain (Call 911 at 3rd dose within 15 minutes.).  Marland Kitchen NONFORMULARY OR COMPOUNDED ITEM Estradiol 0.02% vag cream prefilled applicators  S: insert appful (1 gram) intravaginally twice weekly.  . predniSONE (STERAPRED UNI-PAK 21 TAB) 10 MG (21) TBPK tablet Take by mouth daily.  . Probiotic Product (ALIGN PO) Take 1 capsule by mouth daily.  . sodium chloride (OCEAN) 0.65 % SOLN nasal spray Place 1 spray into both nostrils as needed for congestion.  . traMADol (ULTRAM) 50 MG tablet Take 1 tablet (50 mg total) by mouth 2 (two) times daily as needed.  . valACYclovir (VALTREX) 500 MG tablet Take one tablet by mouth twice daily for 5 days for early onset symptoms.   No facility-administered encounter medications on file as of 02/10/2020.     Past Medical History:  Diagnosis Date  . Acute blood loss anemia 12/25/2017  . Allergic rhinitis   . Anxiety   . Arthritis    "my whole spine" (07/01/2017)  . Atrial fibrillation ([place])   . Bowel obstruction ([place])    in [place]  . Bradycardia with 41-50 beats per minute 12/27/2017  . Cellulitis of leg, right with large prepatella hematoma and open wounds 12/26/2017  . CKD (chronic kidney disease) stage 2, GFR 60-89 ml/min   . Colon polyps   . Coronary artery disease    10/18 PCI/DES to p/m LCx  with cutting balloon to mLcx  . Diverticulosis of colon   . GERD (gastroesophageal reflux disease)   . Hip bursitis 2010   Dr Para March, Post op seroma  . History of colon polyps   . HSV (herpes simplex virus) anogenital infection 07/2019  . HTN (hypertension)   . IBS (irritable bowel syndrome)    constipation predominant - Dr Earlean Shawl  . Lichen sclerosus   . Osteopenia 11/2016   T score -2.0 FRAX 15%/4.3%  . PAC (premature atrial contraction)    Symptomatiic  . Right bundle branch block (RBBB) on electrocardiogram (ECG) 12/27/2017  . Scoliosis   . SVT (supraventricular tachycardia) ([place])    brief history    Past Surgical History:  Procedure Laterality  Date  . ANTERIOR AND POSTERIOR VAGINAL REPAIR  01/2002   Archie Endo 01/23/2011  . APPENDECTOMY  1948  . CARDIAC CATHETERIZATION  06/26/2017  . CORONARY ANGIOPLASTY WITH STENT PLACEMENT  07/01/2017  . CORONARY ATHERECTOMY N/A 07/01/2017   Procedure: CORONARY ATHERECTOMY;  Surgeon: Belva Crome, MD;  Location: [facility];  Service: Cardiovascular;  Laterality: N/A;  . CORONARY STENT INTERVENTION N/A 07/01/2017   Procedure: CORONARY STENT INTERVENTION;  Surgeon: Belva Crome, MD;  Location: [facility];  Service: Cardiovascular;  Laterality: N/A;  . HAMMER TOE SURGERY    . HEMORRHOID BANDING    . HIP SURGERY Left 04/2009   hip examination under anesthesia followed by greater trochanteric bursectomy; iliotibial band tenotomy/notes 01/20/2011  . I & D EXTREMITY Right 01/10/2018   Procedure: IRRIGATION AND DEBRIDEMENT RIGHT KNEE, APPLY WOUND VAC;  Surgeon: Newt Minion, MD;  Location: [facility];  Service: Orthopedics;  Laterality: Right;  . KNEE BURSECTOMY Right 04/2009   Archie Endo 01/09/2011  . LEFT HEART CATH AND CORONARY ANGIOGRAPHY N/A 06/26/2017   Procedure: LEFT HEART CATH AND CORONARY ANGIOGRAPHY;  Surgeon: Belva Crome, MD;  Location: [facility];  Service: Cardiovascular;  Laterality: N/A;  . PUBOVAGINAL SLING  01/2002   Archie Endo 01/23/2011  . REDUCTION MAMMAPLASTY    . TEMPORARY PACEMAKER N/A 07/01/2017   Procedure: TEMPORARY PACEMAKER;  Surgeon: Belva Crome, MD;  Location: [facility];  Service: Cardiovascular;  Laterality: N/A;  . VAGINAL HYSTERECTOMY  01/2002   Vaginal hysterectomy, bilateral salpingo-oophorectomy/notes 01/23/2011    Family History  Problem Relation Age of Onset  . Colon cancer Mother   . Diabetes Father   . Prostate cancer Father   . Prostate cancer Brother   . Pancreatic cancer Brother   . Stomach cancer Son   . Heart attack Neg Hx   . Stroke Neg Hx   . Esophageal cancer Neg Hx     Social History   Social History Narrative    Regular Exercise -  YES           Review of Systems General: Denies fevers, chills, weight loss CV: Denies chest pain, shortness of breath, palpitations  Physical Exam Vitals with BMI 02/10/2020 02/02/2020 01/21/2020  Height 5\' 3"  5\' 4"  5\' 4"   Weight 156 lbs 13 oz 154 lbs 154 lbs  BMI 27.78 [id number] [id number]  Systolic [id number]  Diastolic 77 87 76  Pulse 72 - -    General:  No acute distress,  Alert and oriented, Non-Toxic, Normal speech and affect HEENT: Normocephalic.  Extraocular movements intact.  Cranial nerves grossly intact.  She has a curvilinear transverse scar across the glabella.  Superior to the scar the skin is a  bit more swollen compared to inferior suggesting an avulsion/flap type injury.  The skin edges are little bit inverted because of this causing a contour discrepancy. The line of the scar itself looks pretty good.  She has normal forehead animation.  Assessment/Plan Patient presents with a contour irregularity due to a scar in the glabellar area 6 months out from her initial injury.  We discussed typical scar maturation and she may have some continued improvement in this contour discrepancy over the next few months.  She would prefer to wait to consider any revision anyhow so I will plan to see her again in September when she comes back from the beach and will decide at that time whether an revision is necessary.  All of her questions were answered and she satisfied with that plan.  Cindra Presume 02/10/2020, 12:21 PM

## 2020-02-11 ENCOUNTER — Ambulatory Visit: Payer: Medicare Other | Admitting: Cardiology

## 2020-02-11 ENCOUNTER — Other Ambulatory Visit: Payer: Self-pay

## 2020-02-11 ENCOUNTER — Encounter: Payer: Self-pay | Admitting: Cardiology

## 2020-02-11 VITALS — BP 110/80 | HR 65 | Ht 63.0 in | Wt 156.1 lb

## 2020-02-11 DIAGNOSIS — R0602 Shortness of breath: Secondary | ICD-10-CM

## 2020-02-11 DIAGNOSIS — R0609 Other forms of dyspnea: Secondary | ICD-10-CM

## 2020-02-11 DIAGNOSIS — E785 Hyperlipidemia, unspecified: Secondary | ICD-10-CM

## 2020-02-11 DIAGNOSIS — I25119 Atherosclerotic heart disease of native coronary artery with unspecified angina pectoris: Secondary | ICD-10-CM | POA: Diagnosis not present

## 2020-02-11 DIAGNOSIS — R0600 Dyspnea, unspecified: Secondary | ICD-10-CM | POA: Diagnosis not present

## 2020-02-11 DIAGNOSIS — I4821 Permanent atrial fibrillation: Secondary | ICD-10-CM | POA: Diagnosis not present

## 2020-02-11 DIAGNOSIS — I10 Essential (primary) hypertension: Secondary | ICD-10-CM

## 2020-02-11 NOTE — Patient Instructions (Signed)
Medication Instructions:   Your physician recommends that you continue on your current medications as directed. Please refer to the Current Medication list given to you today.  *If you need a refill on your cardiac medications before your next appointment, please call your pharmacy*  Lab Work:  You will have labs drawn today: BMET/BNP  If you have labs (blood work) drawn today and your tests are completely normal, you will receive your results only by: Marland Kitchen MyChart Message (if you have MyChart) OR . A paper copy in the mail If you have any lab test that is abnormal or we need to change your treatment, we will call you to review the results.  Testing/Procedures:  Your physician has requested that you have an echocardiogram. Echocardiography is a painless test that uses sound waves to create images of your heart. It provides your doctor with information about the size and shape of your heart and how well your heart's chambers and valves are working. This procedure takes approximately one hour. There are no restrictions for this procedure.  Follow-Up:  On 03/21/20 at 2:45PM with Kathyrn Drown, NP

## 2020-02-11 NOTE — Addendum Note (Signed)
Addended by: Nelva Nay on: 02/11/2020 02:56 PM   Modules accepted: Orders

## 2020-02-12 LAB — BASIC METABOLIC PANEL
BUN/Creatinine Ratio: 16 (ref 12–28)
BUN: 23 mg/dL (ref 8–27)
CO2: 20 mmol/L (ref 20–29)
Calcium: 9.1 mg/dL (ref 8.7–10.3)
Chloride: 102 mmol/L (ref 96–106)
Creatinine, Ser: 1.45 mg/dL — ABNORMAL HIGH (ref 0.57–1.00)
GFR calc Af Amer: 38 mL/min/{1.73_m2} — ABNORMAL LOW (ref >59)
GFR calc non Af Amer: 33 mL/min/{1.73_m2} — ABNORMAL LOW (ref >59)
Glucose: 87 mg/dL (ref 65–99)
Potassium: 5.1 mmol/L (ref 3.5–5.2)
Sodium: 137 mmol/L (ref 134–144)

## 2020-02-12 LAB — URINALYSIS, COMPLETE W/RFL CULTURE
Bilirubin Urine: NEGATIVE
Glucose, UA: NEGATIVE
Hgb urine dipstick: NEGATIVE
Hyaline Cast: NONE SEEN /LPF
Nitrites, Initial: NEGATIVE
Protein, ur: NEGATIVE
RBC / HPF: NONE SEEN /HPF (ref 0–2)
Specific Gravity, Urine: 1.02 (ref 1.001–1.03)
pH: 5.5 (ref 5.0–8.0)

## 2020-02-12 LAB — URINE CULTURE
MICRO NUMBER:: 10543836
SPECIMEN QUALITY:: ADEQUATE

## 2020-02-12 LAB — PRO B NATRIURETIC PEPTIDE: NT-Pro BNP: 2383 pg/mL — ABNORMAL HIGH (ref 0–738)

## 2020-02-12 LAB — CULTURE INDICATED

## 2020-02-12 MED ORDER — CIPROFLOXACIN HCL 250 MG PO TABS: 250.0000 mg | ORAL_TABLET | Freq: Two times a day (BID) | ORAL | 0 refills | Status: AC

## 2020-02-12 NOTE — Addendum Note (Signed)
Addended by: Joseph Pierini D on: 02/12/2020 01:44 PM   Modules accepted: Orders

## 2020-02-14 ENCOUNTER — Other Ambulatory Visit: Payer: Self-pay | Admitting: Interventional Cardiology

## 2020-02-15 ENCOUNTER — Telehealth: Payer: Self-pay | Admitting: Cardiology

## 2020-02-15 ENCOUNTER — Other Ambulatory Visit: Payer: Self-pay

## 2020-02-15 DIAGNOSIS — R0602 Shortness of breath: Secondary | ICD-10-CM

## 2020-02-15 DIAGNOSIS — C44629 Squamous cell carcinoma of skin of left upper limb, including shoulder: Secondary | ICD-10-CM | POA: Diagnosis not present

## 2020-02-15 MED ORDER — FUROSEMIDE 20 MG PO TABS: ORAL_TABLET | ORAL | 3 refills | Status: DC

## 2020-02-15 MED ORDER — POTASSIUM CHLORIDE ER 10 MEQ PO TBCR: 10.0000 meq | EXTENDED_RELEASE_TABLET | Freq: Every day | ORAL | 3 refills | Status: DC

## 2020-02-15 NOTE — Telephone Encounter (Addendum)
Prescription refill request for Eliquis received.  Last office visit: 02/11/2020 Scr: 1.45, 02/11/2020 Age: 85 y.o. Weight: 70.8 kg   Pt qualifies for a Eliquis 5 mg BID. Per telephone note 11/09/2019 pt was to follow up with hematologist for recommendation on Eliquis dose. Pt saw hematologist on 12/17/2019 who recommended pt continue on Eliquis 2.5 mg bid. Pt also saw Kathyrn Drown on 02/11/2020 who recommended pt continue to take Eliquis 2.5mg  BID.   Prescription refill for Eliquis 2.5mg  BID sent.

## 2020-02-15 NOTE — Progress Notes (Signed)
The patient has been notified of the result and verbalized understanding.  All questions (if any) were answered. Patient will start Furosemide 40 mg, 2 tablets by mouth once a day for 5 days, then take 20 mg by mouth once a day. Potassium sent in as well to take with Lasix. Patient will come back on 02/29/20 for repeat labs. Mady Haagensen, [place] 02/15/2020 5:00 PM

## 2020-02-15 NOTE — Telephone Encounter (Signed)
New Message  Pt is calling back to receive her results

## 2020-02-16 ENCOUNTER — Other Ambulatory Visit: Payer: Self-pay | Admitting: Sports Medicine

## 2020-02-16 ENCOUNTER — Other Ambulatory Visit: Payer: Self-pay

## 2020-02-16 ENCOUNTER — Ambulatory Visit (HOSPITAL_COMMUNITY)
Admission: RE | Admit: 2020-02-16 | Discharge: 2020-02-16 | Disposition: A | Discharge status: Home / Self Care | Payer: Medicare Other | Source: Ambulatory Visit | Attending: Cardiology | Admitting: Cardiology

## 2020-02-16 DIAGNOSIS — R0602 Shortness of breath: Secondary | ICD-10-CM

## 2020-02-16 DIAGNOSIS — I4891 Unspecified atrial fibrillation: Secondary | ICD-10-CM | POA: Diagnosis not present

## 2020-02-16 DIAGNOSIS — I2510 Atherosclerotic heart disease of native coronary artery without angina pectoris: Secondary | ICD-10-CM | POA: Diagnosis not present

## 2020-02-16 DIAGNOSIS — I10 Essential (primary) hypertension: Secondary | ICD-10-CM | POA: Diagnosis not present

## 2020-02-16 NOTE — Progress Notes (Signed)
  Echocardiogram 2D Echocardiogram has been performed.  Hailey [name] 02/16/2020, 10:51 AM

## 2020-02-16 NOTE — Telephone Encounter (Signed)
The patient has been notified of the result and verbalized understanding.  All questions (if any) were answered. Patient will start Furosemide 40 mg, 2 tablets by mouth once a day for 5 days, then take 20 mg by mouth once a day. Potassium sent in as well to take with Lasix. Patient will come back on 02/29/20 for repeat labs. Mady Haagensen, [place] 02/15/2020 5:00 PM

## 2020-02-18 ENCOUNTER — Telehealth: Payer: Self-pay | Admitting: Emergency Medicine

## 2020-02-18 ENCOUNTER — Ambulatory Visit: Payer: Medicare Other | Admitting: Sports Medicine

## 2020-02-18 NOTE — Telephone Encounter (Signed)
Patient is calling to get results from her echo and then to discuss the diuretic that Kathyrn Drown NP put her on. Please call.

## 2020-02-18 NOTE — Telephone Encounter (Signed)
I called and spoke with patient, she is aware of echocardiogram results. Patient states that yesterday while she was out running errands she got very dizzy and felt like she was going to pass out. When she got to her car she had to sit for a minute and drink some water. She is wondering if it is because she is taking 2 for 5 days. Patient states that she only took one this morning. Advised patient not to take the 2 today. She is aware that this will be forwarded to [facility] for recommendations.

## 2020-02-19 NOTE — Telephone Encounter (Signed)
I called and spoke with patient, she states that she feels better on 1 Furosemide a day, but is wondering is she is on too much Metoprolol. She states that her heart rate stays low and she can not get her heart rate up at the gym with her trainer. Patient is wondering if the Metoprolol was making her dizzy? She wanted to know if she needs decrease this?

## 2020-02-22 ENCOUNTER — Other Ambulatory Visit: Payer: Self-pay

## 2020-02-22 NOTE — Telephone Encounter (Addendum)
Patient said that she got the estradiol cream from [facility] but the applicator is bigger (she thinks they gave her the wrong one) and she is worried she might be applying too much cream. She would like to go back to getting the compounded Estradiol 0.02% cream at [facility] because it comes in prefilled applicators and she does not have to worry about error.  Standard directions are one gram (appful) twice weekly but I noticed your directions on the Estrace Cream were a little different. Please advise. Thanks

## 2020-02-22 NOTE — Telephone Encounter (Signed)
Yes, usually when I newly start a patient on vaginal estradiol cream I tell them to use it every night for 2 weeks or so and then decrease use to twice per week

## 2020-02-23 MED ORDER — NONFORMULARY OR COMPOUNDED ITEM: 3 refills | Status: DC

## 2020-02-23 NOTE — Telephone Encounter (Signed)
Patient informed ok with Dr. Delilah Shan for compounded estradiol cream. Rx sent to [facility].

## 2020-02-23 NOTE — Telephone Encounter (Signed)
I called and spoke with patient, she is aware to continue Furosemide 40 mg once a day and to decrease Metoprolol XL to 50 mg (2 tablets) once a day to see if this will help with symptoms. Patient will continue to monitor and call back if symptoms do not improve. Patient verbalized understanding and thanked me for the call.

## 2020-02-23 NOTE — Addendum Note (Signed)
Addended by: Ramond Craver on: 02/23/2020 02:03 PM   Modules accepted: Orders

## 2020-02-24 ENCOUNTER — Emergency Department (HOSPITAL_COMMUNITY): Payer: Medicare Other

## 2020-02-24 ENCOUNTER — Emergency Department (HOSPITAL_COMMUNITY)
Admission: EM | Admit: 2020-02-24 | Discharge: 2020-02-24 | Disposition: A | Discharge status: Home / Self Care | Payer: Medicare Other | Attending: Emergency Medicine | Admitting: Emergency Medicine

## 2020-02-24 ENCOUNTER — Encounter (HOSPITAL_COMMUNITY): Payer: Self-pay | Admitting: Emergency Medicine

## 2020-02-24 ENCOUNTER — Other Ambulatory Visit: Payer: Self-pay

## 2020-02-24 DIAGNOSIS — I4891 Unspecified atrial fibrillation: Secondary | ICD-10-CM | POA: Diagnosis not present

## 2020-02-24 DIAGNOSIS — Z87891 Personal history of nicotine dependence: Secondary | ICD-10-CM | POA: Diagnosis not present

## 2020-02-24 DIAGNOSIS — N182 Chronic kidney disease, stage 2 (mild): Secondary | ICD-10-CM | POA: Diagnosis not present

## 2020-02-24 DIAGNOSIS — I4510 Unspecified right bundle-branch block: Secondary | ICD-10-CM | POA: Diagnosis not present

## 2020-02-24 DIAGNOSIS — I129 Hypertensive chronic kidney disease with stage 1 through stage 4 chronic kidney disease, or unspecified chronic kidney disease: Secondary | ICD-10-CM | POA: Diagnosis not present

## 2020-02-24 DIAGNOSIS — Z79899 Other long term (current) drug therapy: Secondary | ICD-10-CM | POA: Diagnosis not present

## 2020-02-24 DIAGNOSIS — R1084 Generalized abdominal pain: Secondary | ICD-10-CM | POA: Diagnosis not present

## 2020-02-24 DIAGNOSIS — E860 Dehydration: Secondary | ICD-10-CM

## 2020-02-24 DIAGNOSIS — R55 Syncope and collapse: Secondary | ICD-10-CM | POA: Insufficient documentation

## 2020-02-24 DIAGNOSIS — Z7901 Long term (current) use of anticoagulants: Secondary | ICD-10-CM | POA: Diagnosis not present

## 2020-02-24 DIAGNOSIS — R519 Headache, unspecified: Secondary | ICD-10-CM | POA: Diagnosis not present

## 2020-02-24 DIAGNOSIS — R109 Unspecified abdominal pain: Secondary | ICD-10-CM | POA: Diagnosis not present

## 2020-02-24 DIAGNOSIS — Z743 Need for continuous supervision: Secondary | ICD-10-CM | POA: Diagnosis not present

## 2020-02-24 DIAGNOSIS — R531 Weakness: Secondary | ICD-10-CM | POA: Diagnosis not present

## 2020-02-24 DIAGNOSIS — R110 Nausea: Secondary | ICD-10-CM | POA: Diagnosis not present

## 2020-02-24 DIAGNOSIS — I959 Hypotension, unspecified: Secondary | ICD-10-CM | POA: Diagnosis not present

## 2020-02-24 LAB — CBC WITH DIFFERENTIAL/PLATELET
Abs Immature Granulocytes: 0.03 10*3/uL (ref 0.00–0.07)
Basophils Absolute: 0 10*3/uL (ref 0.0–0.1)
Basophils Relative: 0 %
Eosinophils Absolute: 0.1 10*3/uL (ref 0.0–0.5)
Eosinophils Relative: 2 %
HCT: 35.2 % — ABNORMAL LOW (ref 36.0–46.0)
Hemoglobin: 10.9 g/dL — ABNORMAL LOW (ref 12.0–15.0)
Immature Granulocytes: 1 %
Lymphocytes Relative: 10 %
Lymphs Abs: 0.5 10*3/uL — ABNORMAL LOW (ref 0.7–4.0)
MCH: 30.8 pg (ref 26.0–34.0)
MCHC: 31 g/dL (ref 30.0–36.0)
MCV: 99.4 fL (ref 80.0–100.0)
Monocytes Absolute: 0.7 10*3/uL (ref 0.1–1.0)
Monocytes Relative: 13 %
Neutro Abs: 3.8 10*3/uL (ref 1.7–7.7)
Neutrophils Relative %: 74 %
Platelets: 234 10*3/uL (ref 150–400)
RBC: 3.54 MIL/uL — ABNORMAL LOW (ref 3.87–5.11)
RDW: 17.3 % — ABNORMAL HIGH (ref 11.5–15.5)
WBC: 5.1 10*3/uL (ref 4.0–10.5)
nRBC: 0 % (ref 0.0–0.2)

## 2020-02-24 LAB — LIPASE, BLOOD: Lipase: 40 U/L (ref 11–51)

## 2020-02-24 LAB — URINALYSIS, ROUTINE W REFLEX MICROSCOPIC
Bilirubin Urine: NEGATIVE
Glucose, UA: NEGATIVE mg/dL
Ketones, ur: 5 mg/dL — AB
Leukocytes,Ua: NEGATIVE
Nitrite: NEGATIVE
Protein, ur: NEGATIVE mg/dL
Specific Gravity, Urine: 1.012 (ref 1.005–1.030)
pH: 5 (ref 5.0–8.0)

## 2020-02-24 LAB — COMPREHENSIVE METABOLIC PANEL
ALT: 12 U/L (ref 0–44)
AST: 25 U/L (ref 15–41)
Albumin: 3.3 g/dL — ABNORMAL LOW (ref 3.5–5.0)
Alkaline Phosphatase: 50 U/L (ref 38–126)
Anion gap: 11 (ref 5–15)
BUN: 25 mg/dL — ABNORMAL HIGH (ref 8–23)
CO2: 18 mmol/L — ABNORMAL LOW (ref 22–32)
Calcium: 8.6 mg/dL — ABNORMAL LOW (ref 8.9–10.3)
Chloride: 109 mmol/L (ref 98–111)
Creatinine, Ser: 1.3 mg/dL — ABNORMAL HIGH (ref 0.44–1.00)
GFR calc Af Amer: 44 mL/min — ABNORMAL LOW (ref >60)
GFR calc non Af Amer: 38 mL/min — ABNORMAL LOW (ref >60)
Glucose, Bld: 103 mg/dL — ABNORMAL HIGH (ref 70–99)
Potassium: 4.2 mmol/L (ref 3.5–5.1)
Sodium: 138 mmol/L (ref 135–145)
Total Bilirubin: 0.8 mg/dL (ref 0.3–1.2)
Total Protein: 5.9 g/dL — ABNORMAL LOW (ref 6.5–8.1)

## 2020-02-24 LAB — TROPONIN I (HIGH SENSITIVITY)
Troponin I (High Sensitivity): 12 ng/L (ref <18)
Troponin I (High Sensitivity): 12 ng/L (ref <18)

## 2020-02-24 LAB — CBG MONITORING, ED: Glucose-Capillary: 72 mg/dL (ref 70–99)

## 2020-02-24 MED ORDER — ONDANSETRON 4 MG PO TBDP
4.0000 mg | ORAL_TABLET | Freq: Three times a day (TID) | ORAL | 0 refills | Status: DC | PRN
Start: 2020-02-24 — End: 2020-03-09

## 2020-02-24 MED ORDER — SODIUM CHLORIDE 0.9 % IV BOLUS
500.0000 mL | Freq: Once | INTRAVENOUS | Status: AC
  Administered 2020-02-24: 500 mL via INTRAVENOUS

## 2020-02-24 MED ORDER — ONDANSETRON HCL 4 MG/2ML IJ SOLN
4.0000 mg | Freq: Once | INTRAMUSCULAR | Status: AC
  Administered 2020-02-24: 4 mg via INTRAVENOUS
  Filled 2020-02-24: qty 2

## 2020-02-24 MED ORDER — DICYCLOMINE HCL 20 MG PO TABS
20.0000 mg | ORAL_TABLET | Freq: Two times a day (BID) | ORAL | 0 refills | Status: DC
Start: 2020-02-24 — End: 2020-03-09

## 2020-02-24 MED ORDER — IOHEXOL 300 MG/ML  SOLN
75.0000 mL | Freq: Once | INTRAMUSCULAR | Status: AC | PRN
  Administered 2020-02-24: 75 mL via INTRAVENOUS

## 2020-02-24 NOTE — ED Notes (Signed)
CBG Results of 79 reported to [place], Therapist, sports.

## 2020-02-24 NOTE — ED Triage Notes (Signed)
Pt BIB [facility] from home, reports syncopal episode x 2 today resulting in a fall. Pt c/o diarrhea x days and poor PO intake. Orthostatic with EMS.

## 2020-02-24 NOTE — ED Notes (Signed)
Discharge instructions reviewed with pt. Pt verbalized understanding.   

## 2020-02-24 NOTE — ED Notes (Signed)
Pt transported to CT ?

## 2020-02-24 NOTE — ED Provider Notes (Signed)
[facility] EMERGENCY DEPARTMENT Provider Note   CSN: [id number] Arrival date & time: 02/24/20  1542     History Chief Complaint  Patient presents with  . Loss of Consciousness    Hailey [name] is a 85 y.o. female.  85 year old female brought in by EMS from home with history of A. fib, on Eliquis, chronic kidney disease, bowel obstruction, with complaint of vomiting and diarrhea with syncopal episode x2.  Patient states that she had vomiting and diarrhea on Saturday (June 12) symptoms improved over the next few days and then returned today shortly after eating breakfast.  Patient reports loose brown stools with nonbloody, nonbilious emesis.  Patient states that while walking back to the bedroom from the bathroom today she felt like she was going to pass out and fell forward into her bedroom landing on the carpet, came to and when she stood up to try to go get her phone from the bed had a second syncopal episode, landing on the carpeted floor.  Patient denies any injuries as result of these falls.  Patient reports mild abdominal cramping, denies fevers, chills, sick contacts, recent antibiotic use.  Patient has been taking Lasix for the past several days as prescribed by her cardiologist for elevated BNP with complaint of fatigue and shortness of breath, feels like this has helped and does not have any shortness of breath or chest pain at this time.  No other complaints or concerns at this time.        Past Medical History:  Diagnosis Date  . Acute blood loss anemia 12/25/2017  . Allergic rhinitis   . Anxiety   . Arthritis    "my whole spine" (07/01/2017)  . Atrial fibrillation ([place])   . Bowel obstruction ([place])    in [place]  . Bradycardia with 41-50 beats per minute 12/27/2017  . Cellulitis of leg, right with large prepatella hematoma and open wounds 12/26/2017  . CKD (chronic kidney disease) stage 2, GFR 60-89 ml/min   . Colon polyps   . Coronary artery disease     10/18 PCI/DES to p/m LCx with cutting balloon to mLcx  . Diverticulosis of colon   . GERD (gastroesophageal reflux disease)   . Hip bursitis 2010   Dr Para March, Post op seroma  . History of colon polyps   . HSV (herpes simplex virus) anogenital infection 07/2019  . HTN (hypertension)   . IBS (irritable bowel syndrome)    constipation predominant - Dr Earlean Shawl  . Lichen sclerosus   . Osteopenia 11/2016   T score -2.0 FRAX 15%/4.3%  . PAC (premature atrial contraction)    Symptomatiic  . Right bundle branch block (RBBB) on electrocardiogram (ECG) 12/27/2017  . Scoliosis   . SVT (supraventricular tachycardia) ([place])    brief history    Patient Active Problem List   Diagnosis Date Noted  . Neurogenic orthostatic hypotension ([place]) 09/16/2019  . Injury of face 08/31/2019  . Leg abrasion, infected, right, subsequent encounter 08/28/2019  . Face lacerations 08/26/2019  . Nose fracture 08/26/2019  . Concussion 08/26/2019  . Wrist pain, acute, right 08/26/2019  . Greater trochanteric pain syndrome of right lower extremity 07/30/2019  . Viral URI with cough 08/15/2018  . Abdominal pain 05/22/2018  . Traumatic hematoma of right knee 01/10/2018  . Ulcer of right knee ([place])   . Bradycardia with 41-50 beats per minute 12/27/2017  . Right bundle branch block (RBBB) on electrocardiogram (ECG) 12/27/2017  . Quadriceps tendon rupture 12/24/2017  .  CKD (chronic kidney disease), stage II 12/24/2017  . Cellulitis of leg, right with large prepatella hematoma and open wounds 12/24/2017  . Iliotibial band syndrome of right side 12/16/2017  . Depression 07/11/2017  . Intervertebral lumbar disc disorder with myelopathy, lumbar region 04/25/2017  . Family history of colon cancer in mother 01/22/2017  . Primary osteoarthritis of both first carpometacarpal joints 01/02/2017  . Pain 11/21/2016  . Gastroenteritis 09/13/2016  . Chronic anticoagulation 05/24/2015  . Coronary artery disease involving  native coronary artery of native heart with angina pectoris ([place]) 12/20/2014  . Iliotibial band syndrome of left side 11/16/2014  . Diverticulitis of colon 08/30/2014  . PAF (paroxysmal atrial fibrillation) ([place]) 11/21/2012  . Greater trochanteric bursitis of both hips 05/21/2012  . Dizzinesses 11/07/2011  . 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 Osteoarthritis of left hip 07/03/2010  . [place] DISEASE, LUMBOSACRAL SPINE 05/18/2010  . SYNCOPE 10/27/2008  . Essential hypertension 06/16/2007  . GERD (gastroesophageal reflux disease) 06/16/2007  . COLONIC POLYPS, HX OF 06/16/2007    Past Surgical History:  Procedure Laterality Date  . ANTERIOR AND POSTERIOR VAGINAL REPAIR  01/2002   Archie Endo 01/23/2011  . APPENDECTOMY  1948  . CARDIAC CATHETERIZATION  06/26/2017  . CORONARY ANGIOPLASTY WITH STENT PLACEMENT  07/01/2017  . CORONARY ATHERECTOMY N/A 07/01/2017   Procedure: CORONARY ATHERECTOMY;  Surgeon: Belva Crome, MD;  Location: [facility];  Service: Cardiovascular;  Laterality: N/A;  . CORONARY STENT INTERVENTION N/A 07/01/2017   Procedure: CORONARY STENT INTERVENTION;  Surgeon: Belva Crome, MD;  Location: [facility];  Service: Cardiovascular;  Laterality: N/A;  . HAMMER TOE SURGERY    . HEMORRHOID BANDING    . HIP SURGERY Left 04/2009   hip examination under anesthesia followed by greater trochanteric bursectomy; iliotibial band tenotomy/notes 01/20/2011  . I & D EXTREMITY Right 01/10/2018   Procedure: IRRIGATION AND DEBRIDEMENT RIGHT KNEE, APPLY WOUND VAC;  Surgeon: Newt Minion, MD;  Location: [facility];  Service: Orthopedics;  Laterality: Right;  . KNEE BURSECTOMY Right 04/2009   Archie Endo 01/09/2011  . LEFT HEART CATH AND CORONARY ANGIOGRAPHY N/A 06/26/2017   Procedure: LEFT HEART CATH AND CORONARY ANGIOGRAPHY;  Surgeon: Belva Crome, MD;  Location: [facility];  Service: Cardiovascular;  Laterality: N/A;  . PUBOVAGINAL SLING  01/2002   Archie Endo  01/23/2011  . REDUCTION MAMMAPLASTY    . TEMPORARY PACEMAKER N/A 07/01/2017   Procedure: TEMPORARY PACEMAKER;  Surgeon: Belva Crome, MD;  Location: [facility];  Service: Cardiovascular;  Laterality: N/A;  . VAGINAL HYSTERECTOMY  01/2002   Vaginal hysterectomy, bilateral salpingo-oophorectomy/notes 01/23/2011     OB History    Gravida  2   Para  2   Term  2   Preterm      AB      Living  1     SAB      TAB      Ectopic      Multiple      Live Births              Family History  Problem Relation Age of Onset  . Colon cancer Mother   . Diabetes Father   . Prostate cancer Father   . Prostate cancer Brother   . Pancreatic cancer Brother   . Stomach cancer Son   . Heart attack Neg Hx   . Stroke Neg Hx   . Esophageal cancer Neg Hx  Social History   Tobacco Use  . Smoking status: Former Smoker    Packs/day: 0.25    Years: 28.00    Pack years: 7.00    Types: Cigarettes    Quit date: 1981    Years since quitting: 40.4  . Smokeless tobacco: Never Used  Vaping Use  . Vaping Use: Never used  Substance Use Topics  . Alcohol use: Yes    Alcohol/week: 7.0 standard drinks    Types: 7 Standard drinks or equivalent per week    Comment: 7 vodka drinks a week  . Drug use: No    Home Medications Prior to Admission medications   Medication Sig Start Date End Date Taking? Authorizing Provider  traMADol (ULTRAM) 50 MG tablet TAKE 1 TABLET BY MOUTH TWICE DAILY. 02/16/20   Stefanie Libel, MD  acetaminophen (TYLENOL) 500 MG tablet Take 1,000 mg by mouth every 6 (six) hours as needed (Coated or capsules).    [provider]  atorvastatin (LIPITOR) 80 MG tablet TAKE 1 TABLET AT 6PM. 03/06/19   Belva Crome, MD  cetirizine (ZYRTEC) 10 MG tablet Take 10 mg by mouth daily.    [provider]  clobetasol cream (TEMOVATE) 0.05 % APPLY NIGHTLY AS DIRECTED. 02/10/20   Joseph Pierini, MD  Coenzyme Q10 (COQ10) 200 MG CAPS One a day 08/26/19    Plotnikov, Evie Lacks, MD  dicyclomine (BENTYL) 20 MG tablet Take 1 tablet (20 mg total) by mouth 2 (two) times daily. 02/24/20   Tacy Learn, PA-C  DULoxetine (CYMBALTA) 60 MG capsule TAKE 1 CAPSULE DAILY. 12/31/19   Plotnikov, Evie Lacks, MD  ELIQUIS 2.5 MG TABS tablet TAKE 1 TABLET BY MOUTH TWICE DAILY. 02/15/20   Belva Crome, MD  esomeprazole (NEXIUM) 40 MG capsule TAKE (1) CAPSULE DAILY. 01/19/20   Plotnikov, Evie Lacks, MD  furosemide (LASIX) 20 MG tablet Take 2 tablets by mouth once a day for 5 days, then take 1 tablet by mouth once a day 02/15/20   Kathyrn Drown D, NP  hyoscyamine (LEVSIN) 0.125 MG tablet Take 1-2 tablets (0.125-0.25 mg total) by mouth every 4 (four) hours as needed for up to 10 days for cramping. 04/01/19 10/06/19  Plotnikov, Evie Lacks, MD  LORazepam (ATIVAN) 1 MG tablet TAKE 1 TABLET TWICE DAILY AS NEEDED FOR ANXIETY/SLEEP. 02/08/20   Plotnikov, Evie Lacks, MD  metoprolol succinate (TOPROL-XL) 25 MG 24 hr tablet TAKE 3 TABLETS DAILY WITH OR IMMEDIATELY FOLLOWING A MEAL. 03/06/19   Plotnikov, Evie Lacks, MD  nitroGLYCERIN (NITROSTAT) 0.4 MG SL tablet Place 0.4 mg under the tongue every 5 (five) minutes as needed for chest pain (Call 911 at 3rd dose within 15 minutes.).    [provider]  NONFORMULARY OR COMPOUNDED ITEM Estradiol 0.02% vag cream prefilled applicators  S:Insert appful hs x first 2 weeks of use then insert appful (1 gram) intravaginally twice weekly. 02/23/20   Joseph Pierini, MD  ondansetron (ZOFRAN ODT) 4 MG disintegrating tablet Take 1 tablet (4 mg total) by mouth every 8 (eight) hours as needed for nausea or vomiting. 02/24/20   Tacy Learn, PA-C  potassium chloride (KLOR-CON) 10 MEQ tablet Take 1 tablet (10 mEq total) by mouth daily. 02/15/20   Tommie Raymond, NP  predniSONE (STERAPRED UNI-PAK 21 TAB) 10 MG (21) TBPK tablet Take by mouth daily. 01/29/20   Thurman Coyer, DO  Probiotic Product (ALIGN PO) Take 1 capsule by mouth daily.    [provider]  sodium  chloride (OCEAN) 0.65 % SOLN nasal spray Place 1 spray into both nostrils as needed for congestion.    [provider]  valACYclovir (VALTREX) 500 MG tablet Take one tablet by mouth twice daily for 5 days for early onset symptoms. 10/30/19   Joseph Pierini, MD    Allergies    Macrobid [nitrofurantoin monohyd macro], Meloxicam, Digoxin and related, Mobic [meloxicam], and Sulfamethoxazole-trimethoprim  Review of Systems   Review of Systems  Constitutional: Negative for chills, diaphoresis and fever.  Eyes: Negative for visual disturbance.  Respiratory: Negative for shortness of breath.   Cardiovascular: Negative for chest pain, palpitations and leg swelling.  Gastrointestinal: Positive for abdominal pain, diarrhea, nausea and vomiting. Negative for blood in stool and constipation.  Genitourinary: Negative for dysuria.  Musculoskeletal: Negative for arthralgias, myalgias, neck pain and neck stiffness.  Skin: Negative for rash and wound.  Allergic/Immunologic: Negative for immunocompromised state.  Neurological: Positive for syncope, weakness and light-headedness. Negative for speech difficulty.  Psychiatric/Behavioral: Negative for confusion.  All other systems reviewed and are negative.   Physical Exam Updated Vital Signs BP 131/87   Pulse 78   Temp 98.3 F (36.8 C) (Oral)   Resp (!) 22   SpO2 100%   Physical Exam Vitals and nursing note reviewed.  Constitutional:      General: She is not in acute distress.    Appearance: She is well-developed. She is not diaphoretic.  HENT:     Head: Normocephalic and atraumatic.  Eyes:     Extraocular Movements: Extraocular movements intact.     Pupils: Pupils are equal, round, and reactive to light.  Cardiovascular:     Rate and Rhythm: Normal rate and regular rhythm.     Pulses: Normal pulses.     Heart sounds: Normal heart sounds.  Pulmonary:     Effort: Pulmonary effort is normal.     Breath  sounds: Normal breath sounds.  Abdominal:     Palpations: Abdomen is soft.     Tenderness: There is no abdominal tenderness.  Musculoskeletal:     Cervical back: Neck supple. No tenderness.     Right lower leg: No edema.     Left lower leg: No edema.  Skin:    General: Skin is warm and dry.     Findings: No erythema or rash.  Neurological:     Mental Status: She is alert and oriented to person, place, and time.  Psychiatric:        Behavior: Behavior normal.     ED Results / Procedures / Treatments   Labs (all labs ordered are listed, but only abnormal results are displayed) Labs Reviewed  COMPREHENSIVE METABOLIC PANEL - Abnormal; Notable for the following components:      Result Value   CO2 18 (*)    Glucose, Bld 103 (*)    BUN 25 (*)    Creatinine, Ser 1.30 (*)    Calcium 8.6 (*)    Total Protein 5.9 (*)    Albumin 3.3 (*)    GFR calc non Af Amer 38 (*)    GFR calc Af Amer 44 (*)    All other components within normal limits  CBC WITH DIFFERENTIAL/PLATELET - Abnormal; Notable for the following components:   RBC 3.54 (*)    Hemoglobin 10.9 (*)    HCT 35.2 (*)    RDW 17.3 (*)    Lymphs Abs 0.5 (*)    All other components within normal limits  GASTROINTESTINAL PANEL BY PCR,  STOOL (REPLACES STOOL CULTURE)  C DIFFICILE QUICK SCREEN W PCR REFLEX  LIPASE, BLOOD  URINALYSIS, ROUTINE W REFLEX MICROSCOPIC  CBG MONITORING, ED  TROPONIN I (HIGH SENSITIVITY)  TROPONIN I (HIGH SENSITIVITY)    EKG EKG Interpretation  Date/Time:  Wednesday February 24 2020 15:48:53 EDT Ventricular Rate:  85 PR Interval:    QRS Duration: 139 QT Interval:  451 QTC Calculation: 537 R Axis:   -18 Text Interpretation: Atrial fibrillation Right bundle branch block new t-wave inversion anterior leads Confirmed by Lennice Sites [phone]) on 02/24/2020 3:54:01 PM   Radiology No results found.  Procedures Procedures (including critical care time)  Medications Ordered in ED Medications    sodium chloride 0.9 % bolus 500 mL (0 mLs Intravenous Stopped 02/24/20 1925)  ondansetron (ZOFRAN) injection 4 mg (4 mg Intravenous Given 02/24/20 1650)  iohexol (OMNIPAQUE) 300 MG/ML solution 75 mL (75 mLs Intravenous Contrast Given 02/24/20 1831)    ED Course  I have reviewed the triage vital signs and the nursing notes.  Pertinent labs & imaging results that were available during my care of the patient were reviewed by me and considered in my medical decision making (see chart for details).  Clinical Course as of Feb 24 1999  Wed Jun 16, [id number]  [id number] 85 year old female presents with complaint of syncopal episode times. Exam is unremarkable. Due to report of fall onto carpeted surface and patient on Eliquis, CT head was ordered and does not show acute injury. CT abdomen pelvis ordered due to abdominal pain/cramping with vomiting and diarrhea, no acute pathology on abdominal CT. Serial troponins ordered due to nonspecific EKG changes, troponin x2 is 12/12, no significant change. CBC consistent with prior baseline anemia with hemoglobin of 10.9. CMP with bicarb of 18, creatinine stable compared to prior. Patient was reportedly orthostatic with EMS. Given 500 mL bolus with improvement in symptoms, she is ambulatory in the department without need for assistance and states that she feels better. Suspect patient was mildly dehydrated due to increase in her Lasix recently followed by illness with vomiting and diarrhea. Patient is tolerating p.o. fluids, will discharge with Zofran ODT and Bentyl with plan for close follow-up with PCP and given strict return to ER precautions. Patient was seen by Dr. Ronnald Nian, ER attending who agrees with plan of care.   [LM]    Clinical Course User Index [LM] Roque Lias   MDM Rules/Calculators/A&P                          Final Clinical Impression(s) / ED Diagnoses Final diagnoses:  Vasovagal syncope  Dehydration    Rx / DC Orders ED Discharge Orders          Ordered    dicyclomine (BENTYL) 20 MG tablet  2 times daily     Discontinue  Reprint     02/24/20 1957    ondansetron (ZOFRAN ODT) 4 MG disintegrating tablet  Every 8 hours PRN     Discontinue  Reprint     02/24/20 1957           Tacy Learn, PA-C 02/24/20 2000    [place], [place], DO 02/25/20 0009

## 2020-02-24 NOTE — Discharge Instructions (Addendum)
Home to rest. Gentle hydration. Take Zofran as needed as prescribed for nausea and vomiting. Take Bentyl as needed for abdominal cramping. Call your doctor tomorrow to plan follow-up in the next 1 to 2 days. Return to the ER for any new or worsening symptoms.

## 2020-02-24 NOTE — ED Provider Notes (Signed)
Medical screening examination/treatment/procedure(s) were conducted as a shared visit with non-physician practitioner(s) and myself.  I personally evaluated the patient during the encounter. Briefly, the patient is a 85 y.o. female with A. fib on Eliquis, CKD, CAD, IBS who presents to the ED after syncopal event.  Patient with normal vitals.  No fever.  Patient has had several episodes of diarrhea, nausea, vomiting this morning.  Passed out after getting up from the toilet.  She fell but states she did not hit her head.  When she stood up again she fell over again.  Did not lose consciousness.  Has some stomach cramping.  Possibly suspicious food intake this morning.  No recent antibiotics.  Did have some diarrhea and vomiting over the weekend but have resolved and started again today.  Neurologically she appears intact.  Will get lab work.  Will get head CT, CT abdomen pelvis.  EKG shows atrial fibrillation.  There are some new T wave inversions anteriorly.  She has been on increased use of diuretics.  Overall she appears volume down and likely event due to dehydration/orthostatic/vasovagal changes.  Not having any chest pain.  Appears less likely to be a cardiac process.  Patient with overall unremarkable lab work.  Creatinine at baseline.  Troponin within limits.  Felt better after IV fluids and antinausea meds.  CT imaging of her head, abdomen levels were overall equal.  Chest x-ray without any signs of infection.  Likely from GI related illness.  Recommend continued hydration at home and given return precautions.  This chart was dictated using voice recognition software.  Despite best efforts to proofread,  errors can occur which can change the documentation meaning.     EKG Interpretation  Date/Time:  Wednesday February 24 2020 15:48:53 EDT Ventricular Rate:  85 PR Interval:    QRS Duration: 139 QT Interval:  451 QTC Calculation: 537 R Axis:   -18 Text Interpretation: Atrial fibrillation Right  bundle branch block new t-wave inversion anterior leads Confirmed by Lennice Sites [phone]) on 02/24/2020 3:54:01 PM           Lennice Sites, DO 02/24/20 1931

## 2020-02-25 ENCOUNTER — Telehealth: Payer: Self-pay | Admitting: Internal Medicine

## 2020-02-25 NOTE — Telephone Encounter (Signed)
   Patient calling to request same day appointment with Dr Alain Marion Patent went to ED last night. She states she doesn't know what medications to take.  Patient declined to schedule with any provider on 6/18. Only wants to see Plotnikov She is upset because scheduler would not overbook schedule for today.  Please advise

## 2020-02-26 ENCOUNTER — Telehealth: Payer: Self-pay | Admitting: Interventional Cardiology

## 2020-02-26 NOTE — Telephone Encounter (Signed)
    Pt c/o medication issue:  1. Name of Medication:   furosemide (LASIX) 20 MG tablet    2. How are you currently taking this medication (dosage and times per day)? Take 2 tablets by mouth once a day for 5 days, then take 1 tablet by mouth once a day  3. Are you having a reaction (difficulty breathing--STAT)?   4. What is your medication issue? Pt said she feel like she is having reaction to this medication, she said she went to ED 06/16 due to dehydration and upset stomach so she current stop taking her lasix. She said her BP is ok but she gets lightheadedness when she gets up so she mostly sitting down today  Please advise

## 2020-02-26 NOTE — Telephone Encounter (Signed)
I am sorry.  We can see her next week.  She can see a different provider if needed urgently.  Thanks

## 2020-02-26 NOTE — Telephone Encounter (Signed)
Spoke with pt re message and pt went to ED yesterday due to passing out Per pt was dehydrated and pt has since stopped the Furosemide on her own Per pt B/P is okay this am Pt is not sure if has anymore SOB as is not very active today Pt did note had to rest after showering no energy Encouraged pt to keep lab  (bmet and bnp )appt for Monday 6/21 Will forward to Dr Tamala Julian for review and recommendations ./cy

## 2020-02-26 NOTE — Telephone Encounter (Signed)
Pt informed of below via MyChart.  

## 2020-02-28 NOTE — Telephone Encounter (Signed)
Bid edema or larger lasix dose help lower extremity edema or SOB? If no impact, decrease to 20 mg daily.

## 2020-02-29 ENCOUNTER — Other Ambulatory Visit: Payer: Self-pay

## 2020-02-29 ENCOUNTER — Other Ambulatory Visit: Payer: Medicare Other | Admitting: *Deleted

## 2020-02-29 DIAGNOSIS — R0602 Shortness of breath: Secondary | ICD-10-CM

## 2020-03-01 ENCOUNTER — Telehealth: Payer: Self-pay | Admitting: Interventional Cardiology

## 2020-03-01 ENCOUNTER — Ambulatory Visit (INDEPENDENT_AMBULATORY_CARE_PROVIDER_SITE_OTHER): Payer: Medicare Other | Admitting: Sports Medicine

## 2020-03-01 DIAGNOSIS — M25551 Pain in right hip: Secondary | ICD-10-CM | POA: Diagnosis not present

## 2020-03-01 LAB — PRO B NATRIURETIC PEPTIDE: NT-Pro BNP: 2147 pg/mL — ABNORMAL HIGH (ref 0–738)

## 2020-03-01 LAB — BASIC METABOLIC PANEL
BUN/Creatinine Ratio: 14 (ref 12–28)
BUN: 18 mg/dL (ref 8–27)
CO2: 15 mmol/L — ABNORMAL LOW (ref 20–29)
Calcium: 8.7 mg/dL (ref 8.7–10.3)
Chloride: 104 mmol/L (ref 96–106)
Creatinine, Ser: 1.26 mg/dL — ABNORMAL HIGH (ref 0.57–1.00)
GFR calc Af Amer: 46 mL/min/{1.73_m2} — ABNORMAL LOW (ref >59)
GFR calc non Af Amer: 39 mL/min/{1.73_m2} — ABNORMAL LOW (ref >59)
Glucose: 92 mg/dL (ref 65–99)
Potassium: 4.1 mmol/L (ref 3.5–5.2)
Sodium: 139 mmol/L (ref 134–144)

## 2020-03-01 NOTE — Telephone Encounter (Signed)
Spoke with pt and advised I am waiting on Dr. Tamala Julian to review those labs and give recommendations.  Her concern was the elevated BNP and that the ranges on there indicate HF.  Advised pt that her recent echo shows that her pumping function is normal.  Pt appreciative for call.

## 2020-03-01 NOTE — Patient Instructions (Signed)
WE need to do some hip exercises They must be easy so as not to affect your heart failure Let's do them in a chair or seated on couch  1.  Take hip to the side 2. Rotate hip in and out 3. Lift hip up a few inches off couch 4. Squeeze a pillow between your knees  Start with no more than 10 repeats of each exercise Then if not tired later in the day do this a second time Stop at less than 10 if tired  Tylenol is good pain  If you started having night pain that keeps you awake I would want to inject the hip

## 2020-03-01 NOTE — Telephone Encounter (Signed)
Patient calling stating she read her lab results and is concerned. She is requesting Dr. Tamala Julian call her back.

## 2020-03-01 NOTE — Telephone Encounter (Signed)
Pt in for labs yesterday.  Had BMET and Pro BNP done.  Pt has been off of Lasix due to recent hospitalization.  Pt did restart Furosemide at 20mg  today.  Pt called in to check on lab results because she seen the elevated BNP.  Advised pt once Dr. Tamala Julian reviews lab results, I will call back with any recommendations.  Pt appreciative for call.

## 2020-03-01 NOTE — Progress Notes (Signed)
Chief complaint; right hip pain and improving left hip pain  Patient was last seen 1 month ago She had gluteus medius tendinopathy with a hematoma along the left gluteus medius She has a spur at the greater trochanter I modified her exercises I stopped hip abduction which was painful This is doing much better and the pain almost completely resolved with Tylenol  However since last being seen she has right hip pain This is located laterally She had a syncopal episode with some dehydration and has been evaluated by Dr. Tamala Julian in cardiology She has heart failure with a high BNP and sometimes gets low blood pressures which may have contributed to the syncope She is unsure if she landed on her hip but knows that she bruised both knees  Review of systems Patient does not feel short of breath after taking a diuretic She does not have night pain in either hip She gets moderately good relief with Tylenol for her right hip She is not taking tramadol  Physical examination Pleasant elderly female in no acute distress BP 121/89   Ht 5\' 3"  (1.6 m)   Wt 150 lb (68 kg)   BMI 26.57 kg/m   Both hips showed normal internal and external rotation Both hips show normal flexion with good strength Tenderness to palpation directly over the right greater trochanter Abduction strength is still moderately weak She can do voluntary abduction adduction and flexion without pain

## 2020-03-01 NOTE — Assessment & Plan Note (Signed)
She has a recurrence of her greater trochanteric pain syndrome This may have been triggered by her recent fall  I suggested that we do a very limited home exercise program This will involve all hip motions in a seated position Use Tylenol for pain Do not push exercise if she is having any congestive heart failure symptoms Continue to follow-up with cardiology for control of her heart failure See me in 1 to 2 months

## 2020-03-02 NOTE — Telephone Encounter (Signed)
Spoke with Kathyrn Drown, NP and she said let's have pt take Furosemide 40mg  QD x 5 days again and repeat labs in a week.  Also, would like her to be seen sooner.  Spoke with pt and reviewed lab results and recommendations.  Pt agreeable to plan.  Pt was planning to leave for the beach Friday.  She asked that I go ahead and schedule her for Monday and if they decide to continue with beach plans, she will call back and cancel 6/28 appt.  Left July appt in the event she cancels on Monday.

## 2020-03-03 ENCOUNTER — Telehealth: Payer: Self-pay | Admitting: Internal Medicine

## 2020-03-03 NOTE — Telephone Encounter (Signed)
Spoke with pt and discussed with her she should follow-up with PCP after hospitalization. Pt states she did not call them back because they did not help her when she called. Discussed with pt that per the notes on 6/17 they would have been able to see her this week. Pt states they were going out of town and she did not call them back. Pt encouraged to follow-up with PCP. She states she will contact their office.

## 2020-03-04 ENCOUNTER — Ambulatory Visit (INDEPENDENT_AMBULATORY_CARE_PROVIDER_SITE_OTHER): Payer: Medicare Other | Admitting: Family

## 2020-03-04 ENCOUNTER — Encounter: Payer: Self-pay | Admitting: Family

## 2020-03-04 ENCOUNTER — Other Ambulatory Visit: Payer: Self-pay

## 2020-03-04 VITALS — BP 134/90 | HR 59 | Temp 98.2°F | Wt 152.8 lb

## 2020-03-04 DIAGNOSIS — R55 Syncope and collapse: Secondary | ICD-10-CM

## 2020-03-04 NOTE — Progress Notes (Signed)
Hailey [name] is a 85 y.o. female with the following history as recorded in Epic[employer]:  Patient Active Problem List   Diagnosis Date Noted  . Neurogenic orthostatic hypotension ([place]) 09/16/2019  . Injury of face 08/31/2019  . Leg abrasion, infected, right, subsequent encounter 08/28/2019  . Face lacerations 08/26/2019  . Nose fracture 08/26/2019  . Concussion 08/26/2019  . Wrist pain, acute, right 08/26/2019  . Greater trochanteric pain syndrome of right lower extremity 07/30/2019  . Viral URI with cough 08/15/2018  . Abdominal pain 05/22/2018  . Traumatic hematoma of right knee 01/10/2018  . Ulcer of right knee ([place])   . Bradycardia with 41-50 beats per minute 12/27/2017  . Right bundle branch block (RBBB) on electrocardiogram (ECG) 12/27/2017  . Quadriceps tendon rupture 12/24/2017  . CKD (chronic kidney disease), stage II 12/24/2017  . Cellulitis of leg, right with large prepatella hematoma and open wounds 12/24/2017  . Iliotibial band syndrome of right side 12/16/2017  . Depression 07/11/2017  . Intervertebral lumbar disc disorder with myelopathy, lumbar region 04/25/2017  . Family history of colon cancer in mother 01/22/2017  . Primary osteoarthritis of both first carpometacarpal joints 01/02/2017  . Pain 11/21/2016  . Gastroenteritis 09/13/2016  . Chronic anticoagulation 05/24/2015  . Coronary artery disease involving native coronary artery of native heart with angina pectoris ([place]) 12/20/2014  . Iliotibial band syndrome of left side 11/16/2014  . Diverticulitis of colon 08/30/2014  . PAF (paroxysmal atrial fibrillation) ([place]) 11/21/2012  . Greater trochanteric bursitis of both hips 05/21/2012  . Dizzinesses 11/07/2011  . 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 Osteoarthritis of left hip 07/03/2010  . [place] DISEASE, LUMBOSACRAL SPINE 05/18/2010  . SYNCOPE 10/27/2008  . Essential hypertension 06/16/2007  . GERD (gastroesophageal reflux disease) 06/16/2007   . COLONIC POLYPS, HX OF 06/16/2007    Current Outpatient Medications  Medication Sig Dispense Refill  . acetaminophen (TYLENOL) 500 MG tablet Take 1,000 mg by mouth every 6 (six) hours as needed (Coated or capsules).    Marland Kitchen atorvastatin (LIPITOR) 80 MG tablet TAKE 1 TABLET AT 6PM. 90 tablet 3  . cetirizine (ZYRTEC) 10 MG tablet Take 10 mg by mouth daily.    . clobetasol cream (TEMOVATE) 0.05 % APPLY NIGHTLY AS DIRECTED. 60 g 1  . Coenzyme Q10 (COQ10) 200 MG CAPS One a day 100 capsule 0  . dicyclomine (BENTYL) 20 MG tablet Take 1 tablet (20 mg total) by mouth 2 (two) times daily. 20 tablet 0  . DULoxetine (CYMBALTA) 60 MG capsule TAKE 1 CAPSULE DAILY. 90 capsule 1  . ELIQUIS 2.5 MG TABS tablet TAKE 1 TABLET BY MOUTH TWICE DAILY. 180 tablet 1  . esomeprazole (NEXIUM) 40 MG capsule TAKE (1) CAPSULE DAILY. 90 capsule 1  . furosemide (LASIX) 20 MG tablet Take 2 tablets by mouth once a day for 5 days, then take 1 tablet by mouth once a day 35 tablet 3  . LORazepam (ATIVAN) 1 MG tablet TAKE 1 TABLET TWICE DAILY AS NEEDED FOR ANXIETY/SLEEP. 180 tablet 0  . metoprolol succinate (TOPROL-XL) 25 MG 24 hr tablet TAKE 3 TABLETS DAILY WITH OR IMMEDIATELY FOLLOWING A MEAL. (Patient taking differently: Take 25 mg by mouth daily. Take 2 per day) 270 tablet 3  . nitroGLYCERIN (NITROSTAT) 0.4 MG SL tablet Place 0.4 mg under the tongue every 5 (five) minutes as needed for chest pain (Call 911 at 3rd dose within 15 minutes.).    Marland Kitchen NONFORMULARY OR COMPOUNDED ITEM Estradiol 0.02% vag cream  prefilled applicators  S:Insert appful hs x first 2 weeks of use then insert appful (1 gram) intravaginally twice weekly. 24 each 3  . ondansetron (ZOFRAN ODT) 4 MG disintegrating tablet Take 1 tablet (4 mg total) by mouth every 8 (eight) hours as needed for nausea or vomiting. 12 tablet 0  . potassium chloride (KLOR-CON) 10 MEQ tablet Take 1 tablet (10 mEq total) by mouth daily. 30 tablet 3  . Probiotic Product (ALIGN PO) Take 1  capsule by mouth daily.    . sodium chloride (OCEAN) 0.65 % SOLN nasal spray Place 1 spray into both nostrils as needed for congestion.    . traMADol (ULTRAM) 50 MG tablet TAKE 1 TABLET BY MOUTH TWICE DAILY. 60 tablet 0  . valACYclovir (VALTREX) 500 MG tablet Take one tablet by mouth twice daily for 5 days for early onset symptoms. 30 tablet 1  . hyoscyamine (LEVSIN) 0.125 MG tablet Take 1-2 tablets (0.125-0.25 mg total) by mouth every 4 (four) hours as needed for up to 10 days for cramping. 100 tablet 3   No current facility-administered medications for this visit.    Allergies: Macrobid [nitrofurantoin monohyd macro], Meloxicam, Digoxin and related, Mobic [meloxicam], and Sulfamethoxazole-trimethoprim  Past Medical History:  Diagnosis Date  . Acute blood loss anemia 12/25/2017  . Allergic rhinitis   . Anxiety   . Arthritis    "my whole spine" (07/01/2017)  . Atrial fibrillation ([place])   . Bowel obstruction ([place])    in [place]  . Bradycardia with 41-50 beats per minute 12/27/2017  . Cellulitis of leg, right with large prepatella hematoma and open wounds 12/26/2017  . CKD (chronic kidney disease) stage 2, GFR 60-89 ml/min   . Colon polyps   . Coronary artery disease    10/18 PCI/DES to p/m LCx with cutting balloon to mLcx  . Diverticulosis of colon   . GERD (gastroesophageal reflux disease)   . Hip bursitis 2010   Dr Para March, Post op seroma  . History of colon polyps   . HSV (herpes simplex virus) anogenital infection 07/2019  . HTN (hypertension)   . IBS (irritable bowel syndrome)    constipation predominant - Dr Earlean Shawl  . Lichen sclerosus   . Osteopenia 11/2016   T score -2.0 FRAX 15%/4.3%  . PAC (premature atrial contraction)    Symptomatiic  . Right bundle branch block (RBBB) on electrocardiogram (ECG) 12/27/2017  . Scoliosis   . SVT (supraventricular tachycardia) ([place])    brief history    Past Surgical History:  Procedure Laterality Date  . ANTERIOR AND POSTERIOR VAGINAL  REPAIR  01/2002   Archie Endo 01/23/2011  . APPENDECTOMY  1948  . CARDIAC CATHETERIZATION  06/26/2017  . CORONARY ANGIOPLASTY WITH STENT PLACEMENT  07/01/2017  . CORONARY ATHERECTOMY N/A 07/01/2017   Procedure: CORONARY ATHERECTOMY;  Surgeon: Belva Crome, MD;  Location: [facility];  Service: Cardiovascular;  Laterality: N/A;  . CORONARY STENT INTERVENTION N/A 07/01/2017   Procedure: CORONARY STENT INTERVENTION;  Surgeon: Belva Crome, MD;  Location: [facility];  Service: Cardiovascular;  Laterality: N/A;  . HAMMER TOE SURGERY    . HEMORRHOID BANDING    . HIP SURGERY Left 04/2009   hip examination under anesthesia followed by greater trochanteric bursectomy; iliotibial band tenotomy/notes 01/20/2011  . I & D EXTREMITY Right 01/10/2018   Procedure: IRRIGATION AND DEBRIDEMENT RIGHT KNEE, APPLY WOUND VAC;  Surgeon: Newt Minion, MD;  Location: [facility];  Service: Orthopedics;  Laterality: Right;  .  KNEE BURSECTOMY Right 04/2009   Archie Endo 01/09/2011  . LEFT HEART CATH AND CORONARY ANGIOGRAPHY N/A 06/26/2017   Procedure: LEFT HEART CATH AND CORONARY ANGIOGRAPHY;  Surgeon: Belva Crome, MD;  Location: [facility];  Service: Cardiovascular;  Laterality: N/A;  . PUBOVAGINAL SLING  01/2002   Archie Endo 01/23/2011  . REDUCTION MAMMAPLASTY    . TEMPORARY PACEMAKER N/A 07/01/2017   Procedure: TEMPORARY PACEMAKER;  Surgeon: Belva Crome, MD;  Location: [facility];  Service: Cardiovascular;  Laterality: N/A;  . VAGINAL HYSTERECTOMY  01/2002   Vaginal hysterectomy, bilateral salpingo-oophorectomy/notes 01/23/2011    Family History  Problem Relation Age of Onset  . Colon cancer Mother   . Diabetes Father   . Prostate cancer Father   . Prostate cancer Brother   . Pancreatic cancer Brother   . Stomach cancer Son   . Heart attack Neg Hx   . Stroke Neg Hx   . Esophageal cancer Neg Hx     Social History   Tobacco Use  . Smoking status: Former Smoker    Packs/day: 0.25     Years: 28.00    Pack years: 7.00    Types: Cigarettes    Quit date: 1981    Years since quitting: 40.5  . Smokeless tobacco: Never Used  Substance Use Topics  . Alcohol use: Yes    Alcohol/week: 7.0 standard drinks    Types: 7 Standard drinks or equivalent per week    Comment: 7 vodka drinks a week    Subjective:  Patient was seen at the ER on 02/24/20 with episode of vasovagal syncope; was thought to be dehydrated from increased Lasix dosage and viral illness; notes that she has continued to have some intermittent episodes per day in the past week; had labs done with cardiologist earlier this week and they are making adjustments to her medications based on BNP. She does feel that she is already starting to feel better;  Notes that she is scheduled to see her cardiologist on Monday to follow-up;     Objective:  Vitals:   03/04/20 1505  BP: 134/90  Pulse: (!) 59  Temp: 98.2 F (36.8 C)  TempSrc: Oral  SpO2: 94%  Weight: 152 lb 12.8 oz (69.3 kg)    General: Well developed, well nourished, in no acute distress  Skin : Warm and dry.  Head: Normocephalic and atraumatic  Eyes: Sclera and conjunctiva clear; pupils round and reactive to light; extraocular movements intact  Ears: External normal; canals clear; tympanic membranes normal  Oropharynx: Pink, supple. No suspicious lesions  Neck: Supple without thyromegaly, adenopathy  Lungs: Respirations unlabored; clear to auscultation bilaterally without wheeze, rales, rhonchi  CVS exam: normal rate and regular rhythm.  Neurologic: Alert and oriented; speech intact; face symmetrical; moves all extremities well; CNII-XII intact without focal deficit   Assessment:  1. SYNCOPE     Plan:  Keep planned follow-up with cardiology- they have already been working with patient on these symptoms and she is already feeling better.  Plan to see her PCP as needed otherwise.    No follow-ups on file.  No orders of the defined types were placed in  this encounter.   Requested Prescriptions    No prescriptions requested or ordered in this encounter

## 2020-03-05 ENCOUNTER — Other Ambulatory Visit: Payer: Self-pay | Admitting: Internal Medicine

## 2020-03-05 NOTE — Progress Notes (Signed)
Cardiology Office Note    Date:  03/07/2020   ID:  Hailey Hailey [name], DOB 05-25-1935, MRN [id number]  PCP:  Hailey Anger, MD  Cardiologist: Dr. Tamala Julian, MD   Chief Complaint  Patient presents with  . Follow-up    History of Present Illness: Hailey Hailey [name] is a 85 y.o. female who presents for follow-up, seen for Dr. Tamala Hailey [name].  Hailey Hailey [name] has a hx of PSVT, paroxysmal atrial fibrillationonchronic anticoagulation,abnormal prior myocardial perfusion imaging 2017, CAD with PCI DES of RCA 2018,and hypertension.  She was last seen by Dr. Tamala Hailey [name] 10/06/2019 and was doing well from a CV standpoint.   Pt called the office 02/09/20 with noticed increased fatigue and heavy breathing over the last several days. She was seen in follow-up 02/11/2020 for her symptoms at which time EKG today shows atrial fibrillation with rate control. Initially there was concern if AF was causing her symptoms however it appears that she recently wore a monitor which showed continuous AF>>she did not have these symptoms at that time.  There was some concern for progressive CAD therefore echocardiogram was obtained 02/16/2020 which showed LVEF at 65 to 70% with no regional wall motion abnormalities, G2 DD, PA pressure 58.7 mmHg, moderate TR, mild to moderate aortic sclerosis without stenosis.  BNP was obtained which was found to be elevated at 2383 therefore diuretic dose was increased with repeat BNP on 02/29/2020 2147. She was trialed with a second round of increased dosing of Lasix for several days followed by return to baseline dosing without Hailey [name] improvement.   Today she is seen for follow up and reports symptoms improvement. She states that she no longer feels as fatigued and SOB has subsided. Lab work appears to be back to baseline. [place] will be going to the beach and staying for approximately 2 months. We discussed that she may continue with Lasix 20 however may take an additional 20 if needed for SOB. She denies  chest pain, palpitations, SOB, LE edema, orthopnea, dizziness or syncope.   Past Medical History:  Diagnosis Date  . Acute blood loss anemia 12/25/2017  . Allergic rhinitis   . Anxiety   . Arthritis    "my whole spine" (07/01/2017)  . Atrial fibrillation ([place])   . Bowel obstruction ([place])    in [place]  . Bradycardia with 41-50 beats per minute 12/27/2017  . Cellulitis of leg, right with large prepatella hematoma and open wounds 12/26/2017  . CKD (chronic kidney disease) stage 2, GFR 60-89 ml/min   . Colon polyps   . Coronary artery disease    10/18 PCI/DES to p/m LCx with cutting balloon to mLcx  . Diverticulosis of colon   . GERD (gastroesophageal reflux disease)   . Hip bursitis 2010   Dr Para March, Post op seroma  . History of colon polyps   . HSV (herpes simplex virus) anogenital infection 07/2019  . HTN (hypertension)   . IBS (irritable bowel syndrome)    constipation predominant - Dr Earlean Shawl  . Lichen sclerosus   . Osteopenia 11/2016   T score -2.0 FRAX 15%/4.3%  . PAC (premature atrial contraction)    Symptomatiic  . Right bundle branch block (RBBB) on electrocardiogram (ECG) 12/27/2017  . Scoliosis   . SVT (supraventricular tachycardia) ([place])    brief history    Past Surgical History:  Procedure Laterality Date  . ANTERIOR AND POSTERIOR VAGINAL REPAIR  01/2002   Archie Endo 01/23/2011  . APPENDECTOMY  1948  . CARDIAC CATHETERIZATION  06/26/2017  . CORONARY ANGIOPLASTY WITH STENT PLACEMENT  07/01/2017  . CORONARY ATHERECTOMY N/A 07/01/2017   Procedure: CORONARY ATHERECTOMY;  Surgeon: Belva Crome, MD;  Location: [facility];  Service: Cardiovascular;  Laterality: N/A;  . CORONARY STENT INTERVENTION N/A 07/01/2017   Procedure: CORONARY STENT INTERVENTION;  Surgeon: Belva Crome, MD;  Location: [facility];  Service: Cardiovascular;  Laterality: N/A;  . HAMMER TOE SURGERY    . HEMORRHOID BANDING    . HIP SURGERY Left 04/2009   hip examination under  anesthesia followed by greater trochanteric bursectomy; iliotibial band tenotomy/notes 01/20/2011  . I & D EXTREMITY Right 01/10/2018   Procedure: IRRIGATION AND DEBRIDEMENT RIGHT KNEE, APPLY WOUND VAC;  Surgeon: Newt Minion, MD;  Location: [facility];  Service: Orthopedics;  Laterality: Right;  . KNEE BURSECTOMY Right 04/2009   Archie Endo 01/09/2011  . LEFT HEART CATH AND CORONARY ANGIOGRAPHY N/A 06/26/2017   Procedure: LEFT HEART CATH AND CORONARY ANGIOGRAPHY;  Surgeon: Belva Crome, MD;  Location: [facility];  Service: Cardiovascular;  Laterality: N/A;  . PUBOVAGINAL SLING  01/2002   Archie Endo 01/23/2011  . REDUCTION MAMMAPLASTY    . TEMPORARY PACEMAKER N/A 07/01/2017   Procedure: TEMPORARY PACEMAKER;  Surgeon: Belva Crome, MD;  Location: [facility];  Service: Cardiovascular;  Laterality: N/A;  . VAGINAL HYSTERECTOMY  01/2002   Vaginal hysterectomy, bilateral salpingo-oophorectomy/notes 01/23/2011     Current Outpatient Medications  Medication Sig Dispense Refill  . acetaminophen (TYLENOL) 500 MG tablet Take 1,000 mg by mouth every 6 (six) hours as needed (Coated or capsules).    Marland Kitchen atorvastatin (LIPITOR) 80 MG tablet TAKE 1 TABLET AT 6PM. 90 tablet 3  . cetirizine (ZYRTEC) 10 MG tablet Take 10 mg by mouth daily.    . clobetasol cream (TEMOVATE) 0.05 % APPLY NIGHTLY AS DIRECTED. 60 g 1  . dicyclomine (BENTYL) 20 MG tablet Take 1 tablet (20 mg total) by mouth 2 (two) times daily. 20 tablet 0  . DULoxetine (CYMBALTA) 60 MG capsule TAKE 1 CAPSULE DAILY. 90 capsule 1  . ELIQUIS 2.5 MG TABS tablet TAKE 1 TABLET BY MOUTH TWICE DAILY. 180 tablet 1  . esomeprazole (NEXIUM) 40 MG capsule TAKE (1) CAPSULE DAILY. 90 capsule 1  . furosemide (LASIX) 20 MG tablet Take 20 mg by mouth daily.    Marland Kitchen LORazepam (ATIVAN) 1 MG tablet TAKE 1 TABLET TWICE DAILY AS NEEDED FOR ANXIETY/SLEEP. 180 tablet 0  . nitroGLYCERIN (NITROSTAT) 0.4 MG SL tablet Place 0.4 mg under the tongue every 5 (five) minutes as  needed for chest pain (Call 911 at 3rd dose within 15 minutes.).    Marland Kitchen NONFORMULARY OR COMPOUNDED ITEM Estradiol 0.02% vag cream prefilled applicators  S:Insert appful hs x first 2 weeks of use then insert appful (1 gram) intravaginally twice weekly. 24 each 3  . ondansetron (ZOFRAN ODT) 4 MG disintegrating tablet Take 1 tablet (4 mg total) by mouth every 8 (eight) hours as needed for nausea or vomiting. 12 tablet 0  . potassium chloride (KLOR-CON) 10 MEQ tablet Take 1 tablet (10 mEq total) by mouth daily. 30 tablet 3  . Probiotic Product (ALIGN PO) Take 1 capsule by mouth daily.    . sodium chloride (OCEAN) 0.65 % SOLN nasal spray Place 1 spray into both nostrils as needed for congestion.    . traMADol (ULTRAM) 50 MG tablet TAKE 1 TABLET BY MOUTH TWICE DAILY. 60 tablet 0  . valACYclovir (VALTREX) 500  MG tablet Take one tablet by mouth twice daily for 5 days for early onset symptoms. 30 tablet 1  . metoprolol succinate (TOPROL XL) 25 MG 24 hr tablet Take 1 tablet (25 mg total) by mouth daily. 90 tablet 3   No current facility-administered medications for this visit.    Allergies:   Macrobid [nitrofurantoin monohyd macro], Meloxicam, Digoxin and related, Mobic [meloxicam], and Sulfamethoxazole-trimethoprim    Social History:  The patient  reports that she quit smoking about 40 years ago. Her smoking use included cigarettes. She has a 7.00 pack-year smoking history. She has never used smokeless tobacco. She reports current alcohol use of about 7.0 standard drinks of alcohol per week. She reports that she does not use drugs.   Family History:  The patient's family history includes Colon cancer in her mother; Diabetes in her father; Pancreatic cancer in her brother; Prostate cancer in her brother and father; Stomach cancer in her son.    ROS:  Please see the history of present illness.   Otherwise, review of systems are positive for none.   All other systems are reviewed and negative.    PHYSICAL  EXAM: VS:  BP 118/80   Pulse (!) 55   Ht 5\' 3"  (1.6 m)   Wt 150 lb (68 kg)   SpO2 96%   BMI 26.57 kg/m  , BMI Body mass index is 26.57 kg/m.   General: Well developed, well nourished, NAD Neck: Negative for carotid bruits. No JVD Lungs:Clear to ausculation bilaterally. No wheezes, rales, or rhonchi. Breathing is unlabored. Cardiovascular: RRR with S1 S2. No murmur Extremities: No edema. Neuro: Alert and oriented. No focal deficits. No facial asymmetry. MAE spontaneously. Psych: Responds to questions appropriately with normal affect.    EKG:  EKG is not ordered today. The ekg ordered today demonstrates    Recent Labs: 02/24/2020: ALT 12; Hemoglobin 10.9; Platelets 234 02/29/2020: BUN 18; Creatinine, Ser 1.26; NT-Pro BNP 2,147; Potassium 4.1; Sodium 139    Lipid Panel    Component Value Date/Time   CHOL 171 11/26/2016 0844   TRIG 99 11/26/2016 0844   TRIG 183 (H) 07/26/2006 0903   HDL 90 11/26/2016 0844   CHOLHDL 1.9 11/26/2016 0844   CHOLHDL 2.2 07/04/2014 0528   VLDL 19 07/04/2014 0528   LDLCALC 61 11/26/2016 0844   LDLDIRECT 89.1 06/29/2013 1604     Wt Readings from Last 3 Encounters:  03/07/20 150 lb (68 kg)  03/04/20 152 lb 12.8 oz (69.3 kg)  03/01/20 150 lb (68 kg)    Other studies Reviewed: Additional studies/ records that were reviewed today include: Review of the above records demonstrates:   Monitor 04/29/19:  Continuous atrial fibrillation and flutter  Coronary stent intervention 06/2017:   Dist RCA lesion, 50 %stenosed.   Successful PCI with Orbital atherectomy followed by stenting of a heavily calcified right coronary reducing a proximal to mid 85% stenosis to 0% with with TIMI grade 3 flow. An Onyx 4.0 x 26 mm DES was post.-dilated to 4.5 mm in diameter.  RECOMMENDATIONS:   Aspirin, Plavix, and Eliquis for 1 month then drop aspirin.  Plavix and Eliquis x 5 months then drop plavix.  Resume Eliquis in AM 07/02/2017.  LHC  06/26/2017:  Severe diffuse three-vessel coronary calcification, particularly have in the right coronary artery.  85-90% proximal followed by generalized 50-60% mid RCA calcified stenosis.  Distal left main 25%. Heavily calcified.  Irregularities in the proximal and mid LAD up to 50%. Heavily calcified.  Circumflex artery is relatively small and is free of any significant obstruction.   Normal left ventricular systolic function with normal left ventricular end-diastolic pressure. EF 60%.  RECOMMENDATIONS:   Plavix is started today.  Resume Eliquis in a.m. Discontinue eloquent's after p.m. dose on Friday.  Orbital atherectomy will be planned for 07/01/2017 probably from right femoral approach with temporary pacemaker insertion to avoid bradycardia.  ASSESSMENT AND PLAN:  1.  Shortness of breath/exertional fatigue with known history of CAD with RCA stenting in 2018: -Reports symptoms improvement today -We reduced her Toprol at last OV to 50mg  PO QD due low BP>>>reports improvement with fatigue>>>HR is 55bpm today with stable BP. Plan to reduce Toprol to 25mg  PO QD to allow for greater HR  2. HTN: -Low normal at 118/80 with HR 55bpm -Will reduce Toprol to 25mg  PO QD    3. Permanent atrial fibrillation: -Rate controlled>> on beta-blocker>>reduce to 25mg  PO QD  -AC with low dose Eliquis secondary to age and renal insufficiency   4. HLD: -Goal LDL <70 -Continue statin  5. Factor XI deficiency: -Hematologist discussed at last OV  Current medicines are reviewed at length with the patient today.  The patient does not have concerns regarding medicines.  The following changes have been made:  Reduce Toprol to 25mg  PO QD, can add an extra 20mg  PO QD if SOB but call the office if needed three or more times in two weeks   Labs/ tests ordered today include: None  No orders of the defined types were placed in this encounter.   Disposition:   FU with Dr. Tamala Hailey [name]  in 4  months  Signed, Kathyrn Drown, NP  03/07/2020 4:37 PM    [address] [place], [place], [place]  [address] Phone: [phone]; Fax: [phone]

## 2020-03-07 ENCOUNTER — Other Ambulatory Visit: Payer: Self-pay

## 2020-03-07 ENCOUNTER — Ambulatory Visit: Payer: Medicare Other | Admitting: Cardiology

## 2020-03-07 ENCOUNTER — Encounter: Payer: Self-pay | Admitting: Cardiology

## 2020-03-07 VITALS — BP 118/80 | HR 55 | Ht 63.0 in | Wt 150.0 lb

## 2020-03-07 DIAGNOSIS — R0602 Shortness of breath: Secondary | ICD-10-CM

## 2020-03-07 DIAGNOSIS — I4821 Permanent atrial fibrillation: Secondary | ICD-10-CM

## 2020-03-07 DIAGNOSIS — Z7901 Long term (current) use of anticoagulants: Secondary | ICD-10-CM

## 2020-03-07 DIAGNOSIS — E785 Hyperlipidemia, unspecified: Secondary | ICD-10-CM

## 2020-03-07 DIAGNOSIS — I25119 Atherosclerotic heart disease of native coronary artery with unspecified angina pectoris: Secondary | ICD-10-CM | POA: Diagnosis not present

## 2020-03-07 MED ORDER — METOPROLOL SUCCINATE ER 25 MG PO TB24
25.0000 mg | ORAL_TABLET | Freq: Every day | ORAL | 3 refills | Status: DC
Start: 2020-03-07 — End: 2021-03-23

## 2020-03-07 NOTE — Patient Instructions (Signed)
Medication Instructions:  Your physician has recommended you make the following change in your medication:  1.  REDUCE the Toprol to 25 mg taking 1 tablet daily 2.  You can keep the Lasix as taking 1 tablet daily.  You may take an extra tablet, DAILY, ONLY AS NEEDED FOR SHORTNESS OF BREATH.  IF YOU HAVE TO DO THIS 3X'S IN A 2 WEEK SPAN, CALL us AND LET us KNOW.  *If you need a refill on your cardiac medications before your next appointment, please call your pharmacy*   Lab Work: None ordered  If you have labs (blood work) drawn today and your tests are completely normal, you will receive your results only by: Marland Kitchen MyChart Message (if you have MyChart) OR . A paper copy in the mail If you have any lab test that is abnormal or we need to change your treatment, we will call you to review the results.   Testing/Procedures: None ordered    Follow-Up: At [facility], you and your health needs are our priority.  As part of our continuing mission to provide you with exceptional heart care, we have created designated Provider Care Teams.  These Care Teams include your primary Cardiologist (physician) and Advanced Practice Providers (APPs -  Physician Assistants and Nurse Practitioners) who all work together to provide you with the care you need, when you need it.  We recommend signing up for the patient portal called "MyChart".  Sign up information is provided on this After Visit Summary.  MyChart is used to connect with patients for Virtual Visits (Telemedicine).  Patients are able to view lab/test results, encounter notes, upcoming appointments, etc.  Non-urgent messages can be sent to your provider as well.   To learn more about what you can do with MyChart, go to [email or web address].    Your next appointment:   4 month(s)  The format for your next appointment:   In Person  Provider:   You may see Sinclair Grooms, MD or one of the following Advanced Practice Providers on your  designated Care Team:    Truitt Merle, NP  Cecilie Kicks, NP  Kathyrn Drown, NP    Other Instructions

## 2020-03-09 ENCOUNTER — Other Ambulatory Visit: Payer: Self-pay

## 2020-03-10 MED ORDER — DICYCLOMINE HCL 20 MG PO TABS: 20.0000 mg | ORAL_TABLET | Freq: Two times a day (BID) | ORAL | 1 refills | Status: DC

## 2020-03-10 MED ORDER — ONDANSETRON 4 MG PO TBDP: 4.0000 mg | ORAL_TABLET | Freq: Three times a day (TID) | ORAL | 0 refills | Status: DC | PRN

## 2020-03-12 NOTE — Progress Notes (Deleted)
Cardiology Office Note   Date:  03/12/2020   ID:  Hailey [name], DOB Nov 24, 1934, MRN [id number]  PCP:  Cassandria Anger, MD  Cardiologist: Dr. Tamala Julian, MD   No chief complaint on file.     History of Present Illness: Hailey [name] is a 85 y.o. female who presents for follow-up, seen by Dr. Tamala Julian.  Hailey [name] has ahx of PSVT, paroxysmal atrial fibrillationonchronic anticoagulation,abnormal prior myocardial perfusion imaging 2017,CAD with PCI DES of RCA 2018,and hypertension.  She was last seen by Dr. Tamala Julian 10/06/2019 and was doing well from a CV standpoint.  Pt called the office 6/1/21with noticed increased fatigue and heavy breathing over the last several days. She was seen in follow-up 02/11/2020 for her symptoms at which time EKG today shows atrial fibrillation with rate control. Initially there was concern if AF was causing her symptoms however it appears that she recently wore a monitor which showed continuous AF>>she did not have these symptoms at that time.  There was some concern for progressive CAD therefore echocardiogram was obtained 02/16/2020 which showed LVEF at 65 to 70% with no regional wall motion abnormalities, G2 DD, PA pressure 58.7 mmHg, moderate TR, mild to moderate aortic sclerosis without stenosis.  BNP was obtained which was found to be elevated at 2383 therefore diuretic dose was increased with repeat BNP on 02/29/2020 2147. She was trialed with a second round of increased dosing of Lasix for several days followed by return to baseline dosing without much improvement.   She was last seen by myself 03/07/2020 for follow up with significant symptom improvement despite ongoing elevated BNP levels.    (((They will be going to the beach and staying for approximately 2 months. We discussed that she may continue with Lasix 20 however may take an additional 20 if needed for SOB. She denies chest pain, palpitations, SOB, LE edema, orthopnea, dizziness or  syncope.    1.Shortness of breath/exertional fatigue with known history of CAD with RCA stenting in 2018: -Reports symptoms improvement today -We reduced her Toprol at last OV to 50mg  PO QD due low BP>>>reports improvement with fatigue>>>HR is 55bpm today with stable BP. Plan to reduce Toprol to 25mg  PO QD to allow for greater HR  2. HTN: -Low normal at 118/80 with HR 55bpm -Will reduce Toprol to 25mg  PO QD   3. Permanentatrial fibrillation: -Rate controlled>>on beta-blocker>>reduce to 25mg  PO QD  -AC with low dose Eliquis secondary to age and renal insufficiency   4. HLD: -Goal LDL <70 -Continuestatin  5. Factor XI deficiency: -Hematologist discussed at last OV 4 months   Past Medical History:  Diagnosis Date  . Acute blood loss anemia 12/25/2017  . Allergic rhinitis   . Anxiety   . Arthritis    "my whole spine" (07/01/2017)  . Atrial fibrillation ([place])   . Bowel obstruction ([place])    in [place]  . Bradycardia with 41-50 beats per minute 12/27/2017  . Cellulitis of leg, right with large prepatella hematoma and open wounds 12/26/2017  . CKD (chronic kidney disease) stage 2, GFR 60-89 ml/min   . Colon polyps   . Coronary artery disease    10/18 PCI/DES to p/m LCx with cutting balloon to mLcx  . Diverticulosis of colon   . GERD (gastroesophageal reflux disease)   . Hip bursitis 2010   Dr Para March, Post op seroma  . History of colon polyps   . HSV (herpes simplex virus) anogenital infection 07/2019  . HTN (hypertension)   .  IBS (irritable bowel syndrome)    constipation predominant - Dr Earlean Shawl  . Lichen sclerosus   . Osteopenia 11/2016   T score -2.0 FRAX 15%/4.3%  . PAC (premature atrial contraction)    Symptomatiic  . Right bundle branch block (RBBB) on electrocardiogram (ECG) 12/27/2017  . Scoliosis   . SVT (supraventricular tachycardia) ([place])    brief history    Past Surgical History:  Procedure Laterality Date  . ANTERIOR AND POSTERIOR VAGINAL REPAIR   01/2002   Archie Endo 01/23/2011  . APPENDECTOMY  1948  . CARDIAC CATHETERIZATION  06/26/2017  . CORONARY ANGIOPLASTY WITH STENT PLACEMENT  07/01/2017  . CORONARY ATHERECTOMY N/A 07/01/2017   Procedure: CORONARY ATHERECTOMY;  Surgeon: Belva Crome, MD;  Location: [facility];  Service: Cardiovascular;  Laterality: N/A;  . CORONARY STENT INTERVENTION N/A 07/01/2017   Procedure: CORONARY STENT INTERVENTION;  Surgeon: Belva Crome, MD;  Location: [facility];  Service: Cardiovascular;  Laterality: N/A;  . HAMMER TOE SURGERY    . HEMORRHOID BANDING    . HIP SURGERY Left 04/2009   hip examination under anesthesia followed by greater trochanteric bursectomy; iliotibial band tenotomy/notes 01/20/2011  . I & D EXTREMITY Right 01/10/2018   Procedure: IRRIGATION AND DEBRIDEMENT RIGHT KNEE, APPLY WOUND VAC;  Surgeon: Newt Minion, MD;  Location: [facility];  Service: Orthopedics;  Laterality: Right;  . KNEE BURSECTOMY Right 04/2009   Archie Endo 01/09/2011  . LEFT HEART CATH AND CORONARY ANGIOGRAPHY N/A 06/26/2017   Procedure: LEFT HEART CATH AND CORONARY ANGIOGRAPHY;  Surgeon: Belva Crome, MD;  Location: [facility];  Service: Cardiovascular;  Laterality: N/A;  . PUBOVAGINAL SLING  01/2002   Archie Endo 01/23/2011  . REDUCTION MAMMAPLASTY    . TEMPORARY PACEMAKER N/A 07/01/2017   Procedure: TEMPORARY PACEMAKER;  Surgeon: Belva Crome, MD;  Location: [facility];  Service: Cardiovascular;  Laterality: N/A;  . VAGINAL HYSTERECTOMY  01/2002   Vaginal hysterectomy, bilateral salpingo-oophorectomy/notes 01/23/2011     Current Outpatient Medications  Medication Sig Dispense Refill  . acetaminophen (TYLENOL) 500 MG tablet Take 1,000 mg by mouth every 6 (six) hours as needed (Coated or capsules).    Marland Kitchen atorvastatin (LIPITOR) 80 MG tablet TAKE 1 TABLET AT 6PM. 90 tablet 3  . cetirizine (ZYRTEC) 10 MG tablet Take 10 mg by mouth daily.    . clobetasol cream (TEMOVATE) 0.05 % APPLY NIGHTLY AS  DIRECTED. 60 g 1  . dicyclomine (BENTYL) 20 MG tablet Take 1 tablet (20 mg total) by mouth 2 (two) times daily. 60 tablet 1  . DULoxetine (CYMBALTA) 60 MG capsule TAKE 1 CAPSULE DAILY. 90 capsule 1  . ELIQUIS 2.5 MG TABS tablet TAKE 1 TABLET BY MOUTH TWICE DAILY. 180 tablet 1  . esomeprazole (NEXIUM) 40 MG capsule TAKE (1) CAPSULE DAILY. 90 capsule 1  . furosemide (LASIX) 20 MG tablet Take 20 mg by mouth daily.    Marland Kitchen LORazepam (ATIVAN) 1 MG tablet TAKE 1 TABLET TWICE DAILY AS NEEDED FOR ANXIETY/SLEEP. 180 tablet 0  . metoprolol succinate (TOPROL XL) 25 MG 24 hr tablet Take 1 tablet (25 mg total) by mouth daily. 90 tablet 3  . nitroGLYCERIN (NITROSTAT) 0.4 MG SL tablet Place 0.4 mg under the tongue every 5 (five) minutes as needed for chest pain (Call 911 at 3rd dose within 15 minutes.).    Marland Kitchen NONFORMULARY OR COMPOUNDED ITEM Estradiol 0.02% vag cream prefilled applicators  S:Insert appful hs x first 2 weeks of  use then insert appful (1 gram) intravaginally twice weekly. 24 each 3  . ondansetron (ZOFRAN ODT) 4 MG disintegrating tablet Take 1 tablet (4 mg total) by mouth every 8 (eight) hours as needed for nausea or vomiting. 20 tablet 0  . potassium chloride (KLOR-CON) 10 MEQ tablet Take 1 tablet (10 mEq total) by mouth daily. 30 tablet 3  . Probiotic Product (ALIGN PO) Take 1 capsule by mouth daily.    . sodium chloride (OCEAN) 0.65 % SOLN nasal spray Place 1 spray into both nostrils as needed for congestion.    . traMADol (ULTRAM) 50 MG tablet TAKE 1 TABLET BY MOUTH TWICE DAILY. 60 tablet 0  . valACYclovir (VALTREX) 500 MG tablet Take one tablet by mouth twice daily for 5 days for early onset symptoms. 30 tablet 1   No current facility-administered medications for this visit.    Allergies:   Macrobid [nitrofurantoin monohyd macro], Meloxicam, Digoxin and related, Mobic [meloxicam], and Sulfamethoxazole-trimethoprim    Social History:  The patient  reports that she quit smoking about 40 years  ago. Her smoking use included cigarettes. She has a 7.00 pack-year smoking history. She has never used smokeless tobacco. She reports current alcohol use of about 7.0 standard drinks of alcohol per week. She reports that she does not use drugs.   Family History:  The patient's ***family history includes Colon cancer in her mother; Diabetes in her father; Pancreatic cancer in her brother; Prostate cancer in her brother and father; Stomach cancer in her son.    ROS:  Please see the history of present illness.   Otherwise, review of systems are positive for {NONE DEFAULTED:18576::"none"}.   All other systems are reviewed and negative.    PHYSICAL EXAM: VS:  There were no vitals taken for this visit. , BMI There is no height or weight on file to calculate BMI. GEN: Well nourished, well developed, in no acute distress HEENT: normal Neck: no JVD, carotid bruits, or masses Cardiac: ***RRR; no murmurs, rubs, or gallops,no edema  Respiratory:  clear to auscultation bilaterally, normal work of breathing GI: soft, nontender, nondistended, + BS MS: no deformity or atrophy Skin: warm and dry, no rash Neuro:  Strength and sensation are intact Psych: euthymic mood, full affect   EKG:  EKG {ACTION; IS/IS YOV:78588502} ordered today. The ekg ordered today demonstrates ***   Recent Labs: 02/24/2020: ALT 12; Hemoglobin 10.9; Platelets 234 02/29/2020: BUN 18; Creatinine, Ser 1.26; NT-Pro BNP 2,147; Potassium 4.1; Sodium 139    Lipid Panel    Component Value Date/Time   CHOL 171 11/26/2016 0844   TRIG 99 11/26/2016 0844   TRIG 183 (H) 07/26/2006 0903   HDL 90 11/26/2016 0844   CHOLHDL 1.9 11/26/2016 0844   CHOLHDL 2.2 07/04/2014 0528   VLDL 19 07/04/2014 0528   LDLCALC 61 11/26/2016 0844   LDLDIRECT 89.1 06/29/2013 1604      Wt Readings from Last 3 Encounters:  03/07/20 150 lb (68 kg)  03/04/20 152 lb 12.8 oz (69.3 kg)  03/01/20 150 lb (68 kg)      Other studies Reviewed: Additional  studies/ records that were reviewed today include: ***. Review of the above records demonstrates: ***  Monitor 04/29/19:  Continuous atrial fibrillation and flutter  Coronary stent intervention 06/2017:   Dist RCA lesion, 50 %stenosed.   Successful PCI with Orbital atherectomy followed by stenting of a heavily calcified right coronary reducing a proximal to mid 85% stenosis to 0% with with TIMI grade 3  flow. An Onyx 4.0 x 26 mm DES was post.-dilated to 4.5 mm in diameter.  RECOMMENDATIONS:   Aspirin, Plavix, and Eliquis for 1 month then drop aspirin.  Plavix and Eliquis x 5 months then drop plavix.  Resume Eliquis in AM 07/02/2017.  LHC 06/26/2017:  Severe diffuse three-vessel coronary calcification, particularly have in the right coronary artery.  85-90% proximal followed by generalized 50-60% mid RCA calcified stenosis.  Distal left main 25%. Heavily calcified.  Irregularities in the proximal and mid LAD up to 50%. Heavily calcified.  Circumflex artery is relatively small and is free of any significant obstruction.   Normal left ventricular systolic function with normal left ventricular end-diastolic pressure. EF 60%.  RECOMMENDATIONS:   Plavix is started today.  Resume Eliquis in a.m. Discontinue eloquent's after p.m. dose on Friday.  Orbital atherectomy will be planned for 07/01/2017 probably from right femoral approach with temporary pacemaker insertion to avoid bradycardia.   ASSESSMENT AND PLAN:  1.  ***   Current medicines are reviewed at length with the patient today.  The patient {ACTIONS; HAS/DOES NOT HAVE:19233} concerns regarding medicines.  The following changes have been made:  {PLAN; NO CHANGE:13088:s}  Labs/ tests ordered today include: *** No orders of the defined types were placed in this encounter.    Disposition:   FU with *** in {gen number 0-99:[id number]} {Days to years:10300}  Signed, Kathyrn Drown, NP  03/12/2020 4:49 AM     [facility] [address], [place], [place]  [address] Phone: [phone]; Fax: [phone]

## 2020-03-15 ENCOUNTER — Telehealth (INDEPENDENT_AMBULATORY_CARE_PROVIDER_SITE_OTHER): Payer: Medicare Other | Admitting: Internal Medicine

## 2020-03-15 DIAGNOSIS — R109 Unspecified abdominal pain: Secondary | ICD-10-CM | POA: Diagnosis not present

## 2020-03-15 DIAGNOSIS — K219 Gastro-esophageal reflux disease without esophagitis: Secondary | ICD-10-CM | POA: Diagnosis not present

## 2020-03-15 MED ORDER — TRAMADOL HCL 50 MG PO TABS: 25.0000 mg | ORAL_TABLET | Freq: Four times a day (QID) | ORAL | 0 refills | Status: DC | PRN

## 2020-03-15 MED ORDER — HYOSCYAMINE SULFATE 0.125 MG PO TABS: 0.1250 mg | ORAL_TABLET | ORAL | 1 refills | Status: DC | PRN

## 2020-03-15 NOTE — Progress Notes (Signed)
Virtual Visit via Video Note  I connected with Hailey [name] on 03/15/20 at  4:00 PM EDT by a video enabled telemedicine application and verified that I am speaking with the correct person using two identifiers.   I discussed the limitations of evaluation and management by telemedicine and the availability of in person appointments. The patient expressed understanding and agreed to proceed.  I was located at our [facility] office. The patient was at home in [place] w/Hailey [name]. There was Hailey [name] present in the visit.   History of Present Illness: C/o lower abd cramps off and on x 2 days.   There has been no runny nose, cough, chest pain, shortness of breath,  diarrhea, constipation, arthralgias, skin rashes. No fever, no n/v.   Observations/Objective: The patient appears to be in no acute distress, looks well.  Assessment and Plan:  See my Assessment and Plan. Follow Up Instructions:    I discussed the assessment and treatment plan with the patient. The patient was provided an opportunity to ask questions and all were answered. The patient agreed with the plan and demonstrated an understanding of the instructions.   The patient was advised to call back or seek an in-person evaluation if the symptoms worsen or if the condition fails to improve as anticipated.  I provided face-to-face time during this encounter. We were at different locations.   Walker Kehr, MD

## 2020-03-16 ENCOUNTER — Encounter: Payer: Self-pay | Admitting: Internal Medicine

## 2020-03-16 DIAGNOSIS — R109 Unspecified abdominal pain: Secondary | ICD-10-CM | POA: Insufficient documentation

## 2020-03-16 NOTE — Assessment & Plan Note (Signed)
?  etiology Recent abd CT was OK Hyoscyamine prn

## 2020-03-21 ENCOUNTER — Ambulatory Visit: Payer: Medicare Other | Admitting: Cardiology

## 2020-03-27 ENCOUNTER — Other Ambulatory Visit: Payer: Self-pay | Admitting: Internal Medicine

## 2020-05-05 ENCOUNTER — Other Ambulatory Visit: Payer: Self-pay | Admitting: Interventional Cardiology

## 2020-05-18 ENCOUNTER — Ambulatory Visit: Payer: Medicare Other | Admitting: Nurse Practitioner

## 2020-05-19 ENCOUNTER — Ambulatory Visit: Payer: Medicare Other | Admitting: Plastic Surgery

## 2020-05-25 ENCOUNTER — Ambulatory Visit: Payer: Medicare Other | Admitting: Nurse Practitioner

## 2020-05-26 ENCOUNTER — Ambulatory Visit: Payer: Medicare Other | Admitting: Plastic Surgery

## 2020-05-30 ENCOUNTER — Ambulatory Visit: Payer: Medicare Other | Admitting: Nurse Practitioner

## 2020-05-30 ENCOUNTER — Other Ambulatory Visit: Payer: Self-pay

## 2020-05-30 ENCOUNTER — Other Ambulatory Visit: Payer: Self-pay | Admitting: Cardiology

## 2020-05-30 ENCOUNTER — Encounter: Payer: Self-pay | Admitting: Nurse Practitioner

## 2020-05-30 ENCOUNTER — Telehealth: Payer: Self-pay | Admitting: Interventional Cardiology

## 2020-05-30 VITALS — BP 122/80

## 2020-05-30 DIAGNOSIS — R300 Dysuria: Secondary | ICD-10-CM

## 2020-05-30 DIAGNOSIS — L900 Lichen sclerosus et atrophicus: Secondary | ICD-10-CM | POA: Insufficient documentation

## 2020-05-30 DIAGNOSIS — N898 Other specified noninflammatory disorders of vagina: Secondary | ICD-10-CM | POA: Diagnosis not present

## 2020-05-30 LAB — WET PREP FOR TRICH, YEAST, CLUE

## 2020-05-30 MED ORDER — BETAMETHASONE VALERATE 0.1 % EX OINT: 1.0000 "application " | TOPICAL_OINTMENT | Freq: Two times a day (BID) | CUTANEOUS | 2 refills | Status: DC

## 2020-05-30 NOTE — Patient Instructions (Signed)
Lichen Sclerosus Lichen sclerosus is a skin problem. It can happen on any part of the body, but it commonly involves the anal or genital areas. It can cause itching and discomfort in these areas. Treatment can help to control symptoms. When the genital area is affected, getting treatment is important because the condition can cause scarring that may lead to other problems. What are the causes? The cause of this condition is not known. It may be related to an overactive immune system or a lack of certain hormones. Lichen sclerosus is not an infection or a fungus, and it is not passed from one person to another (not contagious). What increases the risk? This condition is more likely to develop in women, usually after menopause. What are the signs or symptoms? Symptoms of this condition include:  Thin, wrinkled, white areas on the skin.  Thickened white areas on the skin.  Red and swollen patches (lesions) on the skin.  Tears or cracks in the skin.  Bruising.  Blood blisters.  Severe itching.  Pain, itching, or burning when urinating. Constipation is also common in people with lichen sclerosus. How is this diagnosed? This condition may be diagnosed with a physical exam. In some cases, a tissue sample (biopsy sample) may be removed to be looked at under a microscope. How is this treated? This condition is usually treated with medicated creams or ointments (topical steroids) that are applied over the affected areas. In some cases, treatment may also include medicines that are taken by mouth. Surgery may be needed in more severe cases that are causing problems such as scarring. Follow these instructions at home:  Take or use over-the-counter and prescription medicines only as told by your health care provider.  Use creams or ointments as told by your health care provider.  Do not scratch the affected areas of skin.  If you are a woman, be sure to keep the vaginal area as clean and dry  as possible.  Clean the affected area of skin gently with water. Avoid using rough towels or toilet paper.  Keep all follow-up visits as told by your health care provider. This is important. Contact a health care provider if:  You have increasing redness, swelling, or pain in the affected area.  You have fluid, blood, or pus coming from the affected area.  You have new lesions on your skin.  You have a fever.  You have pain during sex. Summary  Lichen sclerosus is a skin problem. When the genital area is affected, getting treatment is important because the condition can cause scarring that may lead to other problems.  This condition is usually treated with medicated creams or ointments (topical steroids) that are applied over the affected areas.  Take or use over-the-counter and prescription medicines only as told by your health care provider.  Contact a health care provider if you have new lesions on your skin, have pain during sex, or have increasing redness, swelling, or pain in the affected area.  Keep all follow-up visits as told by your health care provider. This is important. This information is not intended to replace advice given to you by your health care provider. Make sure you discuss any questions you have with your health care provider. Document Revised: 01/09/2018 Document Reviewed: 01/09/2018 Elsevier Patient Education  2020 Elsevier Inc.  

## 2020-05-30 NOTE — Telephone Encounter (Signed)
Pt states she began feeling weak and would have episodes of dizziness.  She stopped the Furosemide for 2 days and symptoms returned.  Started it back Saturday to see what happened and symptoms returned.  Didn't take Furosemide yesterday and felt better but still didn't feel well.  Hasn't taken today and feels a little better as well.  Denies swelling.  Has occasional SOB when she exerts for too long.  Has checked her rhythm with her watch and is in AF most of the time.  When dizzy, BP has been low.  HR has fluctuated from 50s-90s.  Current weight is 145lbs.  Pt has lost 20lbs over the last year.  States this was not on purpose but more related to not having a good appetite.  Advised I will send to Dr. Tamala Julian for review.

## 2020-05-30 NOTE — Progress Notes (Signed)
   Acute Office Visit  Subjective:    Patient ID: Hailey [name], female    DOB: [birth year], 85 y.o.   MRN: [id number]   HPI 85 y.o. presents today for vaginal itching and vulvar burning with urination. Denies vaginal discharge and odor, urinary frequency, urgency, and hematuria. Has spent the summer at the beach wearing wet bathing suits. Used clobetasol ointment some with minimal relief.    Review of Systems  Constitutional: Negative.   Genitourinary: Positive for dysuria and vaginal pain (irritation). Negative for flank pain, frequency, hematuria, urgency and vaginal discharge.       Objective:    Physical Exam Constitutional:      Appearance: Normal appearance.  Genitourinary:    Comments: Thin, white, waxy appearance on vulva with clitoral fissuring    BP 122/80 (BP Location: Right Arm, Patient Position: Sitting, Cuff Size: Normal)  Wt Readings from Last 3 Encounters:  03/07/20 150 lb (68 kg)  03/04/20 152 lb 12.8 oz (69.3 kg)  03/01/20 150 lb (68 kg)   Wet prep negative UA negative     Assessment & Plan:   Problem List Items Addressed This Visit    None    Visit Diagnoses    Lichen sclerosus    -  Primary   Relevant Medications   betamethasone valerate ointment (VALISONE) 0.1 %   Dysuria       Relevant Orders   Urinalysis,Complete w/RFL Culture   Vaginal irritation       Relevant Orders   WET PREP FOR TRICH, YEAST, CLUE     Plan: Exam and symptoms consistent with Lichen Sclerosis. She thinks she has been diagnosed with this before many years ago. UA and wet prep unremarkable. Apply Betamethasone 0.1% ointment twice a day for 4 weeks, then once a day for 4 weeks, and then twice weekly. Keep open to air as much as possible and avoid sitting in a wet bathing suit. Return to office if symptoms worsen or do not improve.      Tamela Gammon [facility], 3:28 PM 05/30/2020

## 2020-05-30 NOTE — Telephone Encounter (Signed)
Pt c/o medication issue: 1. Name of Medication: furosemide 20 mg 2. How are you currently taking this medication (dosage and times per day)? Yes  3. Are you having a reaction (difficulty breathing--STAT)?  Some SOB 4. What is your medication issue? Weak

## 2020-05-30 NOTE — Telephone Encounter (Signed)
I don't see blood pressures.  Weight loss can lead to low BP. Stop Furosemide. Is she on potassium?

## 2020-06-01 LAB — URINALYSIS, COMPLETE W/RFL CULTURE
Bilirubin Urine: NEGATIVE
Glucose, UA: NEGATIVE
Hgb urine dipstick: NEGATIVE
Hyaline Cast: NONE SEEN /LPF
Leukocyte Esterase: NEGATIVE
Nitrites, Initial: NEGATIVE
Specific Gravity, Urine: 1.02 (ref 1.001–1.03)
pH: 5.5 (ref 5.0–8.0)

## 2020-06-01 LAB — URINE CULTURE
MICRO NUMBER:: 10970704
SPECIMEN QUALITY:: ADEQUATE

## 2020-06-01 LAB — CULTURE INDICATED

## 2020-06-01 NOTE — Telephone Encounter (Signed)
Spoke with pt and reviewed information from Dr. Tamala Julian.  She is on a K+ supplement.  Advised to d/c this as well.  BPs have been low 100s on top when dizzy.  Advised pt to continue to monitor BP and let us know if SBP continues to remain low and dizziness continues.  Advised if BP normal and dizziness continues, contact PCP.  Pt appreciative for call.

## 2020-06-02 ENCOUNTER — Other Ambulatory Visit: Payer: Self-pay | Admitting: Nurse Practitioner

## 2020-06-02 DIAGNOSIS — Z20822 Contact with and (suspected) exposure to covid-19: Secondary | ICD-10-CM | POA: Diagnosis not present

## 2020-06-02 DIAGNOSIS — N3001 Acute cystitis with hematuria: Secondary | ICD-10-CM

## 2020-06-02 DIAGNOSIS — N3090 Cystitis, unspecified without hematuria: Secondary | ICD-10-CM | POA: Diagnosis not present

## 2020-06-02 MED ORDER — FOSFOMYCIN TROMETHAMINE 3 G PO PACK: 3.0000 g | PACK | Freq: Once | ORAL | 0 refills | Status: DC

## 2020-06-02 NOTE — Telephone Encounter (Signed)
Pharmacy just sent this over. "Not covered by her insurance."

## 2020-06-08 ENCOUNTER — Ambulatory Visit (INDEPENDENT_AMBULATORY_CARE_PROVIDER_SITE_OTHER): Payer: Medicare Other | Admitting: Plastic Surgery

## 2020-06-08 ENCOUNTER — Other Ambulatory Visit: Payer: Self-pay

## 2020-06-08 ENCOUNTER — Telehealth: Payer: Self-pay | Admitting: Interventional Cardiology

## 2020-06-08 ENCOUNTER — Encounter: Payer: Self-pay | Admitting: Plastic Surgery

## 2020-06-08 VITALS — HR 75 | Temp 97.8°F

## 2020-06-08 DIAGNOSIS — L905 Scar conditions and fibrosis of skin: Secondary | ICD-10-CM | POA: Diagnosis not present

## 2020-06-08 NOTE — Telephone Encounter (Signed)
Encounter not needed

## 2020-06-08 NOTE — Progress Notes (Signed)
   Referring Provider Plotnikov, Evie Lacks, MD [place],  [place] [id number]   CC:  Chief Complaint  Patient presents with  . Follow-up      Hailey [name] is an 85 y.o. female.  HPI: Patient is here in follow-up for a scar across her glabella.  She had a fall about 9 months ago which caused a deep laceration that is curvilinear and transverse in that area and has been bothered by the appearance.  She is still bothered by the depth of the groove.  She also has a new pigmented lesion in the left infraorbital area that she did not notice before and its rapidly changing and becoming somewhat scaly.  She is interested in having me look at that as well.  Review of Systems General: Denies fevers or chills  Physical Exam Vitals with BMI 06/08/2020 05/30/2020 03/07/2020  Height - - 5\' 3"   Weight - - 150 lbs  BMI - - 77.11  Systolic (No Data) 657 903  Diastolic (No Data) 80 80  Pulse 75 - 55    General:  No acute distress,  Alert and oriented, Non-Toxic, Normal speech and affect Scar across her glabella has changed very little in the past 3 months.  She still has quite a deep groove with pincushioning of the upper portion of the incision which was lifted up with her trauma.  Additionally she has a 3 cm diameter area of hyperpigmentation in the left infraorbital area that has a rough texture to it.  It is fairly uniform in color but has a hypopigmented area close to the center.  Assessment/Plan Patient presents with scar deformity after trauma 9 months ago.  I discussed dermabrasion and scar revision with her.  Ultimately I think is been long enough at this point to know that they groove from the scar seems like it is going to persist I think the best course of action would be to excise this and resuture it with some mattress sutures to evert the skin edges.  I would also do a punch biopsy of the pigmented lesion at the same time.  I discussed the risk that include bleeding,  infection, damage to surrounding structures and need for additional procedures.  I discussed that there would be persistent scarring but my hope would be that the contour would be significantly improved from what she has now.  This bothers her enough that she wants to go through with this.  We will try to hold her Eliquis around the time of the procedure if that is possible.  We will plan to check with her cardiologist.  All her questions were answered.  We will plan to proceed with doing this under local anesthesia.  Cindra Presume 06/08/2020, 4:53 PM

## 2020-06-09 ENCOUNTER — Telehealth: Payer: Self-pay | Admitting: *Deleted

## 2020-06-09 NOTE — Telephone Encounter (Signed)
   Primary Cardiologist: Sinclair Grooms, MD  Chart reviewed as part of pre-operative protocol coverage. Patient was contacted 06/09/2020 in reference to pre-operative risk assessment for pending surgery as outlined below.  Hailey [name] was last seen on 03/07/20 by Kathyrn Drown NP. History outlined with PSVT, PAF, CAD s/p PCI 2018, HTN, anxiety, arthritis, GERD, CKD II, PACs, RBBB amongst diagnoses. 2D Echo 02/2020 EF 65-70%, mild LVH, grade 2 DD, moderately reduced RV function, moderate elevation in PASP, mild MR, moderate TR. I reached out to patient for update on how they are doing. The patient affirms they have been stable without any new cardiac symptoms.  Therefore, based on ACC/AHA guidelines, the patient would be at acceptable risk for the planned procedure without further cardiovascular testing. The patient was advised that if she develops new symptoms prior to surgery to contact our office to arrange for a follow-up visit, and she verbalized understanding.  Will route to pharm for input on anticoag.  Charlie Pitter, PA-C 06/09/2020, 10:09 AM

## 2020-06-09 NOTE — Telephone Encounter (Signed)
° °  [facility] Pre-operative Risk Assessment    [facility] STAFF: - Please ensure there is not already an duplicate clearance open for this procedure. - Under Visit Info/Reason for Call, type in Other and utilize the format Clearance MM/DD/YY or Clearance TBD. Do not use dashes or single digits. - If request is for dental extraction, please clarify the # of teeth to be extracted.  Request for surgical clearance:  1. What type of surgery is being performed? REVISION OF FOREHEAD SCAR AND PUNCH Bx LEFT CHEEK   2. When is this surgery scheduled? 06/27/20   3. What type of clearance is required (medical clearance vs. Pharmacy clearance to hold med vs. Both)? PHARM  4. Are there any medications that need to be held prior to surgery and how long? ELIQUIS  5. Practice name and name of physician performing surgery? [facility]; DR. Silverio [facility]   6. What is the office phone number? [phone]   7.   What is the office fax number? [phone]  8.   Anesthesia type (None, local, MAC, general) ? LOCAL   Julaine Hua 06/09/2020, 10:00 AM  _________________________________________________________________   (provider comments below)

## 2020-06-09 NOTE — Telephone Encounter (Signed)
   Primary Cardiologist: Sinclair Grooms, MD  Chart reviewed as part of pre-operative protocol coverage. Given past medical history and clinical stability, based on ACC/AHA guidelines, Hailey [name] would be at acceptable risk for the planned procedure without further cardiovascular testing. The patient was advised that if she develops new symptoms prior to surgery to contact our office to arrange for a follow-up visit, and she verbalized understanding.  Per office protocol, patient can hold Eliquis for 2 days prior to procedure. We typically advise that blood thinners be resumed when felt safe by performing physician.  I will route this recommendation to the requesting party via Epic fax function and remove from pre-op pool.  Please call with questions.  Charlie Pitter, PA-C 06/09/2020, 10:58 AM

## 2020-06-09 NOTE — Telephone Encounter (Signed)
Patient with diagnosis of afib on Eliquis for anticoagulation.    Procedure: REVISION OF FOREHEAD SCAR AND PUNCH Bx LEFT CHEEK  Date of procedure: 06/27/20  CHADS2-VASc score of  5 (HTN, AGE, CAD, AGE, female)  CrCl 27 ml/min  Per office protocol, patient can hold Eliquis for 2 days prior to procedure.

## 2020-06-12 ENCOUNTER — Encounter: Payer: Self-pay | Admitting: Plastic Surgery

## 2020-06-12 NOTE — Progress Notes (Signed)
Surgical Clearance has been received from Dr[facility] Tamala [facility] @ [facility] for patient's upcoming surgery excision of forehead lesion and punch biopsy of pigmented lesion with Dr. Claudia Desanctis.  Medications to hold prior to surgery Eliquis (Stop taking: 2 days prior to surgery;  Begin retaking 24 hours after surgery)

## 2020-06-13 ENCOUNTER — Other Ambulatory Visit: Payer: Self-pay | Admitting: Internal Medicine

## 2020-06-13 ENCOUNTER — Telehealth: Payer: Self-pay

## 2020-06-13 ENCOUNTER — Telehealth: Payer: Self-pay | Admitting: Interventional Cardiology

## 2020-06-13 DIAGNOSIS — H353112 Nonexudative age-related macular degeneration, right eye, intermediate dry stage: Secondary | ICD-10-CM | POA: Diagnosis not present

## 2020-06-13 NOTE — Telephone Encounter (Signed)
call to pt per [place], [place]: cardiac/surgical clearance received from pt's cardiologist & advised pt to stop taking Eliquis 2 days prior to surgery & can resume taking it 24 hrs after surgery. Pt is scheduled for surgery on 06/27/20 with Dr. Claudia Desanctis. Pt understands the orders & she will adjust meds as advised. I instructed her that pre-op group will call her to discuss other meds/plan prior to procedure. She is reminded to call for any other questions/changes or concerns

## 2020-06-13 NOTE — Telephone Encounter (Signed)
Pt c/o swelling: STAT is pt has developed SOB within 24 hours  How much weight have you gained and in what time span? No  1) If swelling, where is the swelling located? Left ankle swelling  2) Are you currently taking a fluid pill? No  3) Are you currently SOB? No   4) Do you have a log of your daily weights (if so, list)? No  5) Have you gained 3 pounds in a day or 5 pounds in a week? No  6) Have you traveled recently? No

## 2020-06-13 NOTE — Telephone Encounter (Signed)
Pt called to report that she had stopped her furosemide 05/30/20... she says that her dizziness has improved but still having some swimmy headedness at night... she says she has eaten a lot more sodium than usual the last few days...  the last few days she has noticed left ankle edema.. she says it is not visible if someone just glances at it but she notices it. She says it is only the left and she denies pain, no color change... Pt denies chest pain and SOB.   I have asked her to elevate her feet and she says she has and the edema seems to get better.   I have also asked her to decrease her sodium. Her BP has been 120/75 and HR 70.   I asked her to call her PCP Dr. Alain Marion in case it is something not fluid related.   Will forward to Dr. Tamala Julian for review.. pt to continue to monitor.

## 2020-06-14 NOTE — Telephone Encounter (Signed)
Stay off lasix. Elevate legs as needed. Let me know if SOB.

## 2020-06-15 NOTE — Telephone Encounter (Signed)
Spoke with pt and made her aware of recommendations.  Pt agreeable.  Swelling improved some but does still occur if she lets her feet dangle too much.

## 2020-06-22 ENCOUNTER — Telehealth: Payer: Self-pay | Admitting: *Deleted

## 2020-06-22 ENCOUNTER — Encounter: Payer: Self-pay | Admitting: Nurse Practitioner

## 2020-06-22 ENCOUNTER — Ambulatory Visit: Payer: Medicare Other | Admitting: Nurse Practitioner

## 2020-06-22 ENCOUNTER — Other Ambulatory Visit: Payer: Self-pay

## 2020-06-22 VITALS — BP 122/80

## 2020-06-22 DIAGNOSIS — L900 Lichen sclerosus et atrophicus: Secondary | ICD-10-CM | POA: Diagnosis not present

## 2020-06-22 DIAGNOSIS — R300 Dysuria: Secondary | ICD-10-CM

## 2020-06-22 LAB — WET PREP FOR TRICH, YEAST, CLUE

## 2020-06-22 MED ORDER — CLOBETASOL PROPIONATE 0.05 % EX CREA: TOPICAL_CREAM | CUTANEOUS | 1 refills | Status: DC

## 2020-06-22 NOTE — Telephone Encounter (Signed)
Patient called back c/o burning with urination and vaginal soreness, asked what to do. I advised patient to schedule OV with provider. Transferred to appointment desk.

## 2020-06-22 NOTE — Telephone Encounter (Signed)
Patient left message in triage voicemail requesting a call back regarding OV on 06/02/20. I called patient and ask her to call me.

## 2020-06-22 NOTE — Progress Notes (Signed)
   Acute Office Visit  Subjective:    Patient ID: Hailey [name], female    DOB: [birth year], 85 y.o.   MRN: [id number]   HPI 85 y.o. presents today for burning with urination and vaginal irritation. Was treated for UTI 05/30/2020 with Fosfomycin and Betamethasone for Lichen Sclerosis. Denies frequency, urgency, hematuria, or back pain. Says the vaginal burning is still there in one small area. She has been applying the cream daily since last appointment. Denies scratching and says she has been patting dry and avoiding excoriation. She used Clobetasol in the past with good relief.    Review of Systems  Constitutional: Negative.   Genitourinary: Positive for dysuria and vaginal pain (burning/irritation). Negative for frequency, hematuria, urgency, vaginal bleeding and vaginal discharge.  Musculoskeletal: Negative for back pain.       Objective:    Physical Exam Constitutional:      Appearance: Normal appearance.  Abdominal:     Tenderness: There is no right CVA tenderness or left CVA tenderness.  Genitourinary:    Labia:        Left: Tenderness present. No lesion.      Vagina: Normal.       BP 122/80 (BP Location: Right Arm, Patient Position: Sitting, Cuff Size: Normal)  Wt Readings from Last 3 Encounters:  03/07/20 150 lb (68 kg)  03/04/20 152 lb 12.8 oz (69.3 kg)  03/01/20 150 lb (68 kg)   UA trace leukocytes, wbc 0-5, hyaline casts present Wet prep negative     Assessment & Plan:   Problem List Items Addressed This Visit      Other   Lichen sclerosus - Primary   Relevant Medications   clobetasol cream (TEMOVATE) 0.05 %    Other Visit Diagnoses    Burning with urination       Relevant Orders   Urinalysis,Complete w/RFL Culture   WET PREP FOR [place], YEAST, CLUE     Plan: Reassurance provided on negative urinalysis. General vulvar area shows improvement with use of Betamethasone ointment but one area remains erythematous and is most likely causing her  discomfort. Recommend switching to Clobetasol since she did well on this in the past. Apply nightly for 2 more weeks, then every other night for 4 weeks, and then twice a week. If she does not see improvement in 1 month we will have her return for a biopsy. She is agreeable to plan.      Tamela Gammon [facility], 3:53 PM 06/22/2020

## 2020-06-24 LAB — URINALYSIS, COMPLETE W/RFL CULTURE
Bilirubin Urine: NEGATIVE
Glucose, UA: NEGATIVE
Hgb urine dipstick: NEGATIVE
Nitrites, Initial: NEGATIVE
RBC / HPF: NONE SEEN /HPF (ref 0–2)
Specific Gravity, Urine: 1.02 (ref 1.001–1.03)
pH: 5.5 (ref 5.0–8.0)

## 2020-06-24 LAB — URINE CULTURE
MICRO NUMBER:: 11066867
SPECIMEN QUALITY:: ADEQUATE

## 2020-06-24 LAB — CULTURE INDICATED

## 2020-06-27 ENCOUNTER — Encounter: Payer: Self-pay | Admitting: Plastic Surgery

## 2020-06-27 ENCOUNTER — Other Ambulatory Visit (HOSPITAL_COMMUNITY)
Admission: RE | Admit: 2020-06-27 | Discharge: 2020-06-27 | Disposition: A | Discharge status: Home / Self Care | Payer: Medicare Other | Source: Ambulatory Visit | Attending: Plastic Surgery | Admitting: Plastic Surgery

## 2020-06-27 ENCOUNTER — Ambulatory Visit: Payer: Medicare Other | Admitting: Plastic Surgery

## 2020-06-27 ENCOUNTER — Other Ambulatory Visit: Payer: Self-pay

## 2020-06-27 VITALS — BP 149/98 | HR 68 | Temp 98.1°F

## 2020-06-27 DIAGNOSIS — L905 Scar conditions and fibrosis of skin: Secondary | ICD-10-CM | POA: Diagnosis not present

## 2020-06-27 DIAGNOSIS — L989 Disorder of the skin and subcutaneous tissue, unspecified: Secondary | ICD-10-CM | POA: Insufficient documentation

## 2020-06-27 DIAGNOSIS — D0439 Carcinoma in situ of skin of other parts of face: Secondary | ICD-10-CM | POA: Diagnosis not present

## 2020-06-27 NOTE — Progress Notes (Signed)
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 Excision of glabellar scar measuring 4 cm 2. Complex closure measuring 4 cm 3. Punch biopsy left cheek lesion  SURGEON: Talmadge Coventry, MD  ANESTHESIA:  Local  COMPLICATIONS: None.   INDICATIONS FOR PROCEDURE:  The patient, Hailey [name] is a 85 y.o. female born on 30-Dec-1934, is here for treatment of glabellar scar and left cheek skin lesion. MRN: [id number]  CONSENT:  Informed consent was obtained directly from the patient. Risks, benefits and alternatives were fully discussed. Specific risks including but not limited to bleeding, infection, hematoma, seroma, scarring, pain, infection, wound healing problems, and need for further surgery were all discussed. The patient did have an ample opportunity to have questions answered to satisfaction.   DESCRIPTION OF PROCEDURE:  Local anesthesia was administered. The patient's operative site was prepped and draped in a sterile fashion. A time out was performed and all information was confirmed to be correct.  The lesion was excised with a 15 blade.  Hemostasis was obtained.  Circumferential undermining was performed and the skin was advanced and closed in layers with interrupted buried Monocryl sutures and 5-0 prolene for the skin.  The lesion excised measured 4 cm, and the total length of closure measured 4 cm.  A 62mm punch was then used to biopsy the left cheek lesion and single 5-0 prolene stitch was used for the closure.    The patient tolerated the procedure well.  There were no complications.

## 2020-06-28 LAB — SURGICAL PATHOLOGY

## 2020-06-29 ENCOUNTER — Encounter: Payer: Self-pay | Admitting: Plastic Surgery

## 2020-07-01 ENCOUNTER — Emergency Department (HOSPITAL_COMMUNITY): Payer: Medicare Other

## 2020-07-01 ENCOUNTER — Encounter (HOSPITAL_COMMUNITY): Payer: Self-pay

## 2020-07-01 ENCOUNTER — Emergency Department (HOSPITAL_COMMUNITY)
Admission: EM | Admit: 2020-07-01 | Discharge: 2020-07-01 | Disposition: A | Discharge status: Home / Self Care | Payer: Medicare Other | Attending: Emergency Medicine | Admitting: Emergency Medicine

## 2020-07-01 ENCOUNTER — Other Ambulatory Visit: Payer: Self-pay

## 2020-07-01 DIAGNOSIS — S34109A Unspecified injury to unspecified level of lumbar spinal cord, initial encounter: Secondary | ICD-10-CM | POA: Diagnosis present

## 2020-07-01 DIAGNOSIS — Z87891 Personal history of nicotine dependence: Secondary | ICD-10-CM | POA: Diagnosis not present

## 2020-07-01 DIAGNOSIS — R079 Chest pain, unspecified: Secondary | ICD-10-CM | POA: Diagnosis not present

## 2020-07-01 DIAGNOSIS — I1310 Hypertensive heart and chronic kidney disease without heart failure, with stage 1 through stage 4 chronic kidney disease, or unspecified chronic kidney disease: Secondary | ICD-10-CM | POA: Insufficient documentation

## 2020-07-01 DIAGNOSIS — R0602 Shortness of breath: Secondary | ICD-10-CM | POA: Diagnosis not present

## 2020-07-01 DIAGNOSIS — J9811 Atelectasis: Secondary | ICD-10-CM | POA: Diagnosis not present

## 2020-07-01 DIAGNOSIS — I10 Essential (primary) hypertension: Secondary | ICD-10-CM | POA: Diagnosis not present

## 2020-07-01 DIAGNOSIS — Z950 Presence of cardiac pacemaker: Secondary | ICD-10-CM | POA: Diagnosis not present

## 2020-07-01 DIAGNOSIS — R0781 Pleurodynia: Secondary | ICD-10-CM | POA: Diagnosis not present

## 2020-07-01 DIAGNOSIS — Z79899 Other long term (current) drug therapy: Secondary | ICD-10-CM | POA: Insufficient documentation

## 2020-07-01 DIAGNOSIS — I712 Thoracic aortic aneurysm, without rupture, unspecified: Secondary | ICD-10-CM

## 2020-07-01 DIAGNOSIS — I2510 Atherosclerotic heart disease of native coronary artery without angina pectoris: Secondary | ICD-10-CM | POA: Diagnosis not present

## 2020-07-01 DIAGNOSIS — X58XXXA Exposure to other specified factors, initial encounter: Secondary | ICD-10-CM | POA: Insufficient documentation

## 2020-07-01 DIAGNOSIS — S22070A Wedge compression fracture of T9-T10 vertebra, initial encounter for closed fracture: Secondary | ICD-10-CM

## 2020-07-01 DIAGNOSIS — I4891 Unspecified atrial fibrillation: Secondary | ICD-10-CM | POA: Diagnosis not present

## 2020-07-01 DIAGNOSIS — N182 Chronic kidney disease, stage 2 (mild): Secondary | ICD-10-CM | POA: Insufficient documentation

## 2020-07-01 DIAGNOSIS — I480 Paroxysmal atrial fibrillation: Secondary | ICD-10-CM | POA: Insufficient documentation

## 2020-07-01 DIAGNOSIS — Z7901 Long term (current) use of anticoagulants: Secondary | ICD-10-CM | POA: Insufficient documentation

## 2020-07-01 DIAGNOSIS — I517 Cardiomegaly: Secondary | ICD-10-CM | POA: Diagnosis not present

## 2020-07-01 LAB — CBC
HCT: 30.7 % — ABNORMAL LOW (ref 36.0–46.0)
Hemoglobin: 9.7 g/dL — ABNORMAL LOW (ref 12.0–15.0)
MCH: 29.2 pg (ref 26.0–34.0)
MCHC: 31.6 g/dL (ref 30.0–36.0)
MCV: 92.5 fL (ref 80.0–100.0)
Platelets: 241 10*3/uL (ref 150–400)
RBC: 3.32 MIL/uL — ABNORMAL LOW (ref 3.87–5.11)
RDW: 16.7 % — ABNORMAL HIGH (ref 11.5–15.5)
WBC: 7.8 10*3/uL (ref 4.0–10.5)
nRBC: 0 % (ref 0.0–0.2)

## 2020-07-01 LAB — TROPONIN I (HIGH SENSITIVITY)
Troponin I (High Sensitivity): 15 ng/L (ref <18)
Troponin I (High Sensitivity): 13 ng/L (ref <18)

## 2020-07-01 LAB — BASIC METABOLIC PANEL
Anion gap: 9 (ref 5–15)
BUN: 24 mg/dL — ABNORMAL HIGH (ref 8–23)
CO2: 22 mmol/L (ref 22–32)
Calcium: 9 mg/dL (ref 8.9–10.3)
Chloride: 105 mmol/L (ref 98–111)
Creatinine, Ser: 1.17 mg/dL — ABNORMAL HIGH (ref 0.44–1.00)
GFR, Estimated: 46 mL/min — ABNORMAL LOW (ref >60)
Glucose, Bld: 102 mg/dL — ABNORMAL HIGH (ref 70–99)
Potassium: 4.3 mmol/L (ref 3.5–5.1)
Sodium: 136 mmol/L (ref 135–145)

## 2020-07-01 MED ORDER — SODIUM CHLORIDE 0.9 % IV BOLUS
1000.0000 mL | Freq: Once | INTRAVENOUS | Status: AC
  Administered 2020-07-01: 1000 mL via INTRAVENOUS

## 2020-07-01 MED ORDER — IOHEXOL 350 MG/ML SOLN
75.0000 mL | Freq: Once | INTRAVENOUS | Status: AC | PRN
  Administered 2020-07-01: 3 mL via INTRAVENOUS

## 2020-07-01 NOTE — ED Provider Notes (Signed)
[facility] EMERGENCY DEPARTMENT Provider Note   CSN: [id number] Arrival date & time: 07/01/20  1751     History Chief Complaint  Patient presents with  . Shortness of Breath  . Chest Pain    Hailey [name] is a 85 y.o. female w/ hx of A Fib on eliquis, alcohol abuse,CKD, presenting to Ed with pleuritic chest pain.  She reports abrupt onset left/mid sternal chest pain yesterday evening.  It is pressure and sharp sensation.  Worse with inspiration.  Feels like pleurisy she had 40 years ago.  Nothing makes it better.  Currently 5/10 intensity.  She denies cough, SOB, fevers, chills.  She denies calf pain or leg swelling.  She has received both covid vaccines plus a booster shot.  She stopped eliquis earlier this week for 2 days to have a plastic surgeon revise a forehead scar.  She resumed 2 days ago and took her dose this morning.  No hx of DVT or PE.  She quit smoking 40 years ago.  Hx of 1 cardiac stent, no MI hx.  No anginal sx this week  HPI     Past Medical History:  Diagnosis Date  . Acute blood loss anemia 12/25/2017  . Allergic rhinitis   . Anxiety   . Arthritis    "my whole spine" (07/01/2017)  . Atrial fibrillation ([place])   . Bowel obstruction ([place])    in [place]  . Bradycardia with 41-50 beats per minute 12/27/2017  . Cellulitis of leg, right with large prepatella hematoma and open wounds 12/26/2017  . CKD (chronic kidney disease) stage 2, GFR 60-89 ml/min   . Colon polyps   . Coronary artery disease    10/18 PCI/DES to p/m LCx with cutting balloon to mLcx  . Diverticulosis of colon   . GERD (gastroesophageal reflux disease)   . Hip bursitis 2010   Dr Para March, Post op seroma  . History of colon polyps   . HSV (herpes simplex virus) anogenital infection 07/2019  . HTN (hypertension)   . IBS (irritable bowel syndrome)    constipation predominant - Dr Earlean Shawl  . Lichen sclerosus   . Osteopenia 11/2016   T score -2.0 FRAX 15%/4.3%  . PAC  (premature atrial contraction)    Symptomatiic  . Right bundle branch block (RBBB) on electrocardiogram (ECG) 12/27/2017  . Scoliosis   . SVT (supraventricular tachycardia) ([place])    brief history    Patient Active Problem List   Diagnosis Date Noted  . Lichen sclerosus 02/58/5277  . Abdominal cramping 03/16/2020  . Neurogenic orthostatic hypotension ([place]) 09/16/2019  . Injury of face 08/31/2019  . Leg abrasion, infected, right, subsequent encounter 08/28/2019  . Face lacerations 08/26/2019  . Nose fracture 08/26/2019  . Concussion 08/26/2019  . Wrist pain, acute, right 08/26/2019  . Greater trochanteric pain syndrome of right lower extremity 07/30/2019  . Viral URI with cough 08/15/2018  . Abdominal pain 05/22/2018  . Traumatic hematoma of right knee 01/10/2018  . Ulcer of right knee ([place])   . Bradycardia with 41-50 beats per minute 12/27/2017  . Right bundle branch block (RBBB) on electrocardiogram (ECG) 12/27/2017  . Quadriceps tendon rupture 12/24/2017  . CKD (chronic kidney disease), stage II 12/24/2017  . Cellulitis of leg, right with large prepatella hematoma and open wounds 12/24/2017  . Iliotibial band syndrome of right side 12/16/2017  . Depression 07/11/2017  . Intervertebral lumbar disc disorder with myelopathy, lumbar region 04/25/2017  . Family history of colon  cancer in mother 01/22/2017  . Primary osteoarthritis of both first carpometacarpal joints 01/02/2017  . Pain 11/21/2016  . Gastroenteritis 09/13/2016  . Chronic anticoagulation 05/24/2015  . Coronary artery disease involving native coronary artery of native heart with angina pectoris ([place]) 12/20/2014  . Iliotibial band syndrome of left side 11/16/2014  . Diverticulitis of colon 08/30/2014  . PAF (paroxysmal atrial fibrillation) ([place]) 11/21/2012  . Greater trochanteric bursitis of both hips 05/21/2012  . Dizzinesses 11/07/2011  . 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 Osteoarthritis of left hip  07/03/2010  . [place] DISEASE, LUMBOSACRAL SPINE 05/18/2010  . SYNCOPE 10/27/2008  . Essential hypertension 06/16/2007  . GERD (gastroesophageal reflux disease) 06/16/2007  . COLONIC POLYPS, HX OF 06/16/2007    Past Surgical History:  Procedure Laterality Date  . ANTERIOR AND POSTERIOR VAGINAL REPAIR  01/2002   Archie Endo 01/23/2011  . APPENDECTOMY  1948  . CARDIAC CATHETERIZATION  06/26/2017  . CORONARY ANGIOPLASTY WITH STENT PLACEMENT  07/01/2017  . CORONARY ATHERECTOMY N/A 07/01/2017   Procedure: CORONARY ATHERECTOMY;  Surgeon: Belva Crome, MD;  Location: [place] [facility];  Service: Cardiovascular;  Laterality: N/A;  . CORONARY STENT INTERVENTION N/A 07/01/2017   Procedure: CORONARY STENT INTERVENTION;  Surgeon: Belva Crome, MD;  Location: [place] [facility];  Service: Cardiovascular;  Laterality: N/A;  . HAMMER TOE SURGERY    . HEMORRHOID BANDING    . HIP SURGERY Left 04/2009   hip examination under anesthesia followed by greater trochanteric bursectomy; iliotibial band tenotomy/notes 01/20/2011  . I & D EXTREMITY Right 01/10/2018   Procedure: IRRIGATION AND DEBRIDEMENT RIGHT KNEE, APPLY WOUND VAC;  Surgeon: Newt Minion, MD;  Location: [facility];  Service: Orthopedics;  Laterality: Right;  . KNEE BURSECTOMY Right 04/2009   Archie Endo 01/09/2011  . LEFT HEART CATH AND CORONARY ANGIOGRAPHY N/A 06/26/2017   Procedure: LEFT HEART CATH AND CORONARY ANGIOGRAPHY;  Surgeon: Belva Crome, MD;  Location: [place] [facility];  Service: Cardiovascular;  Laterality: N/A;  . PUBOVAGINAL SLING  01/2002   Archie Endo 01/23/2011  . REDUCTION MAMMAPLASTY    . TEMPORARY PACEMAKER N/A 07/01/2017   Procedure: TEMPORARY PACEMAKER;  Surgeon: Belva Crome, MD;  Location: [place] [facility];  Service: Cardiovascular;  Laterality: N/A;  . VAGINAL HYSTERECTOMY  01/2002   Vaginal hysterectomy, bilateral salpingo-oophorectomy/notes 01/23/2011     OB History    Gravida  2   Para  2   Term  2    Preterm      AB      Living  1     SAB      TAB      Ectopic      Multiple      Live Births              Family History  Problem Relation Age of Onset  . Colon cancer Mother   . Diabetes Father   . Prostate cancer Father   . Prostate cancer Brother   . Pancreatic cancer Brother   . Stomach cancer Son   . Heart attack Neg Hx   . Stroke Neg Hx   . Esophageal cancer Neg Hx     Social History   Tobacco Use  . Smoking status: Former Smoker    Packs/day: 0.25    Years: 28.00    Pack years: 7.00    Types: Cigarettes    Quit date: 1981    Years since quitting: 40.8  .  Smokeless tobacco: Never Used  Vaping Use  . Vaping Use: Never used  Substance Use Topics  . Alcohol use: Yes    Alcohol/week: 7.0 standard drinks    Types: 7 Standard drinks or equivalent per week    Comment: 7 vodka drinks a week  . Drug use: No    Home Medications Prior to Admission medications   Medication Sig Start Date End Date Taking? Authorizing Provider  acetaminophen (TYLENOL) 500 MG tablet Take 1,000 mg by mouth every 6 (six) hours as needed (Coated or capsules).    [provider]  atorvastatin (LIPITOR) 80 MG tablet TAKE 1 TABLET DAILY AT 6PM. 05/05/20   Belva Crome, MD  betamethasone valerate ointment (VALISONE) 0.1 % Apply 1 application topically 2 (two) times daily. Apply twice a day for 4 weeks, once a day for 4 weeks, then twice weekly Patient not taking: Reported on 06/27/2020 05/30/20   Marny Lowenstein A, NP  cetirizine (ZYRTEC) 10 MG tablet Take 10 mg by mouth daily.    [provider]  clobetasol cream (TEMOVATE) 0.05 % APPLY NIGHTLY AS DIRECTED. 06/22/20   Marny Lowenstein A, NP  dicyclomine (BENTYL) 20 MG tablet Take 1 tablet (20 mg total) by mouth 2 (two) times daily. Patient not taking: Reported on 06/27/2020 03/10/20   Plotnikov, Evie Lacks, MD  DULoxetine (CYMBALTA) 60 MG capsule TAKE 1 CAPSULE DAILY. 06/15/20   Plotnikov, Evie Lacks, MD  ELIQUIS 2.5  MG TABS tablet TAKE 1 TABLET BY MOUTH TWICE DAILY. 02/15/20   Belva Crome, MD  esomeprazole (NEXIUM) 40 MG capsule TAKE (1) CAPSULE DAILY. 01/19/20   Plotnikov, Evie Lacks, MD  fosfomycin (MONUROL) 3 g PACK TAKE CONTENTS OF 1 PACKET ONCE FOR 1 DOSE. Patient not taking: Reported on 06/27/2020 06/02/20   Marny Lowenstein A, NP  hyoscyamine (LEVSIN) 0.125 MG tablet TAKE 1-2 TABLETS (0.125-0.25 MG) BY MOUTH EVERY 4 HOURS AS NEEDED FOR UP TO 10 DAYS FOR CRAMPING. 03/27/20   Plotnikov, Evie Lacks, MD  LORazepam (ATIVAN) 1 MG tablet TAKE 1 TABLET TWICE DAILY AS NEEDED FOR ANXIETY/SLEEP. 02/08/20   Plotnikov, Evie Lacks, MD  metoprolol succinate (TOPROL XL) 25 MG 24 hr tablet Take 1 tablet (25 mg total) by mouth daily. 03/07/20   Tommie Raymond, NP  nitroGLYCERIN (NITROSTAT) 0.4 MG SL tablet Place 0.4 mg under the tongue every 5 (five) minutes as needed for chest pain (Call 911 at 3rd dose within 15 minutes.).    [provider]  NONFORMULARY OR COMPOUNDED ITEM Estradiol 0.02% vag cream prefilled applicators  S:Insert appful hs x first 2 weeks of use then insert appful (1 gram) intravaginally twice weekly. 02/23/20   Joseph Pierini, MD  ondansetron (ZOFRAN ODT) 4 MG disintegrating tablet Take 1 tablet (4 mg total) by mouth every 8 (eight) hours as needed for nausea or vomiting. 03/10/20   Plotnikov, Evie Lacks, MD  Probiotic Product (ALIGN PO) Take 1 capsule by mouth daily.    [provider]  sodium chloride (OCEAN) 0.65 % SOLN nasal spray Place 1 spray into both nostrils as needed for congestion.    [provider]  traMADol (ULTRAM) 50 MG tablet Take 0.5-1 tablets (25-50 mg total) by mouth every 6 (six) hours as needed for severe pain. Patient not taking: Reported on 06/27/2020 03/15/20   Plotnikov, Evie Lacks, MD  valACYclovir (VALTREX) 500 MG tablet Take one tablet by mouth twice daily for 5 days for early onset symptoms. Patient not taking: Reported on 06/27/2020  10/30/19   Joseph Pierini, MD    Allergies    Macrobid [nitrofurantoin monohyd macro], Meloxicam, Digoxin and related, Mobic [meloxicam], and Sulfamethoxazole-trimethoprim  Review of Systems   Review of Systems  Constitutional: Negative for chills and fever.  HENT: Negative for ear pain and sore throat.   Eyes: Negative for pain and visual disturbance.  Respiratory: Negative for cough and shortness of breath.   Cardiovascular: Positive for chest pain. Negative for palpitations.  Gastrointestinal: Negative for abdominal pain and vomiting.  Musculoskeletal: Positive for back pain. Negative for arthralgias.  Skin: Negative for color change and rash.  Neurological: Negative for syncope and light-headedness.  Psychiatric/Behavioral: Negative for agitation and confusion.  All other systems reviewed and are negative.   Physical Exam Updated Vital Signs BP (!) 172/99   Pulse (!) 47   Temp 98.3 F (36.8 C) (Oral)   Resp 15   Ht 5\' 3"  (1.6 m)   Wt 68 kg   SpO2 98%   BMI 26.56 kg/m   Physical Exam Vitals and nursing note reviewed.  Constitutional:      General: She is not in acute distress.    Appearance: She is well-developed.  HENT:     Head: Normocephalic and atraumatic.  Eyes:     Conjunctiva/sclera: Conjunctivae normal.  Cardiovascular:     Rate and Rhythm: Normal rate and regular rhythm.  Pulmonary:     Effort: Pulmonary effort is normal. No respiratory distress.     Breath sounds: Normal breath sounds.  Abdominal:     Palpations: Abdomen is soft.     Tenderness: There is no abdominal tenderness.  Musculoskeletal:     Cervical back: Neck supple.     Comments: Mid-thoracic spinal midline tenderness  Skin:    General: Skin is warm and dry.  Neurological:     General: No focal deficit present.     Mental Status: She is alert and oriented to person, place, and time.  Psychiatric:        Mood and Affect: Mood normal.        Behavior: Behavior normal.     ED Results / Procedures /  Treatments   Labs (all labs ordered are listed, but only abnormal results are displayed) Labs Reviewed  BASIC METABOLIC PANEL - Abnormal; Notable for the following components:      Result Value   Glucose, Bld 102 (*)    BUN 24 (*)    Creatinine, Ser 1.17 (*)    GFR, Estimated 46 (*)    All other components within normal limits  CBC - Abnormal; Notable for the following components:   RBC 3.32 (*)    Hemoglobin 9.7 (*)    HCT 30.7 (*)    RDW 16.7 (*)    All other components within normal limits  TROPONIN I (HIGH SENSITIVITY)  TROPONIN I (HIGH SENSITIVITY)    EKG EKG Interpretation  Date/Time:  Friday July 01 2020 06:40:28 EDT Ventricular Rate:  54 PR Interval:  232 QRS Duration: 124 QT Interval:  472 QTC Calculation: 447 R Axis:   25 Text Interpretation: Sinus bradycardia with 1st degree A-V block Right bundle branch block Possible Anteroseptal infarct , age undetermined Abnormal ECG No STEMI Confirmed by Octaviano Glow [phone]) on 07/01/2020 11:00:31 AM   Radiology DG Chest 2 View  Result Date: 07/01/2020 CLINICAL DATA:  Chest pain since yesterday, occasionally moving to throat, history of atrial fibrillation, coronary artery disease post stenting, hypertension EXAM: CHEST - 2 VIEW COMPARISON:  02/24/2020 FINDINGS: Minimal enlargement of cardiac silhouette. Atherosclerotic calcification and tortuosity of thoracic aorta. Pulmonary vascularity normal. Bibasilar atelectasis. No acute infiltrate, pleural effusion or pneumothorax. Diffuse osseous demineralization with scoliosis and degenerative disc disease changes thoracic spine. IMPRESSION: Enlargement of cardiac silhouette with bibasilar atelectasis. Aortic Atherosclerosis (ICD10-I70.0). Electronically Signed   By: Lavonia Dana M.D.   On: 07/01/2020 07:23   CT Angio Chest PE W and/or Wo Contrast  Result Date: 07/01/2020 CLINICAL DATA:  Pleuritic chest pain, shortness of breath EXAM: CT ANGIOGRAPHY CHEST WITH CONTRAST  TECHNIQUE: Multidetector CT imaging of the chest was performed using the standard protocol during bolus administration of intravenous contrast. Multiplanar CT image reconstructions and MIPs were obtained to evaluate the vascular anatomy. CONTRAST:  40mL OMNIPAQUE IOHEXOL 350 MG/ML SOLN COMPARISON:  Chest x-ray 07/01/2020. CT chest 02/03/2013, 07/03/2014 FINDINGS: Cardiovascular: Satisfactory opacification of the pulmonary arteries to the segmental level. No evidence of pulmonary embolism. Main pulmonary trunk measures 3.2 cm in diameter. Mid ascending thoracic aorta measures 4.5 cm in diameter (previously measured 4.0 cm on 07/03/2014). Mid aortic arch measures 3.2 cm in diameter (previously 2.8 cm). There is tortuosity of the thoracic aorta. Mild atherosclerotic calcification of the aorta. Coronary artery calcifications are present. Cardiomegaly with left greater than right atrial enlargement. No pericardial effusion. Mediastinum/Nodes: No axillary, mediastinal, or hilar lymphadenopathy. No thyroid abnormality. Trachea and esophagus within normal limits. Lungs/Pleura: Mild bibasilar subsegmental atelectasis, left greater than right. No focal airspace consolidation. No pleural effusion. No pneumothorax. Upper Abdomen: No acute findings within the visualized upper abdomen. Musculoskeletal: Chronic superior and inferior endplate compression fracture of the T10 vertebral body which contains a large intraosseous hemangioma. No new or acute osseous findings. No chest wall abnormality. Review of the MIP images confirms the above findings. IMPRESSION: 1. Negative for pulmonary embolism. 2. Mild bibasilar atelectasis.  Lungs are otherwise clear. 3. Mid ascending thoracic aorta measures 4.5 cm in diameter (previously measured 4.0 cm on 07/03/2014). Ascending thoracic aortic aneurysm. Recommend semi-annual imaging followup by CTA or MRA and referral to cardiothoracic surgery if not already obtained. This recommendation  follows 2010 ACCF/AHA/AATS/ACR/ASA/SCA/SCAI/SIR/STS/SVM Guidelines for the Diagnosis and Management of Patients With Thoracic Aortic Disease. Circulation. 2010; 121: G387-F643. Aortic aneurysm NOS (ICD10-I71.9) 4. Cardiomegaly. 5. Aortic and coronary artery atherosclerosis.  (ICD10-I70.0). 6. Chronic T10 compression fracture. Electronically Signed   By: Davina Poke D.O.   On: 07/01/2020 13:21    Procedures Procedures (including critical care time)  Medications Ordered in ED Medications  sodium chloride 0.9 % bolus 1,000 mL (0 mLs Intravenous Stopped 07/01/20 1428)  iohexol (OMNIPAQUE) 350 MG/ML injection 75 mL (3 mLs Intravenous Contrast Given 07/01/20 1309)    ED Course  I have reviewed the triage vital signs and the nursing notes.  Pertinent labs & imaging results that were available during my care of the patient were reviewed by me and considered in my medical decision making (see chart for details).  85 yo female presenting to the ED with abrupt onset of pleuritic chest pain.  This involves an extensive number of treatment options, and is a complaint that carries with it a high risk of complications and morbidity.  The differential diagnosis includes PE vs ACS vs PNA vs PTX vs referred MSK pain vs reflux vs other  I ordered, reviewed, and interpreted labs, which included troponins (flat on repeat), CBC (normal WBC 7.8, hbg 9.7).  BMP near baseline.   I ordered medication IV fluids for renal hydration in anticipation of IV contrast CT  scan I ordered imaging studies which included DG chest and CT PE I independently visualized and interpreted imaging which showed thoracic aneurysm with small diameter increase from prior imaging, now 4.5 cm, no PNA, no PTX, no PE and the monitor tracing which showed sinus rhythm with occasional bradycardia    Clinical Course as of Jul 01 1852  Fri Jul 01, 2020  1423 On reevaluation discussed T10 compression fx.  She states she was not aware of prior  fx.  She does have spinal midline ttp around this region.  She tells me when she moves in the bed, she also has reproducibility of her pain.  I suspect she may have referred chest pain from her T-spine fx.  I don't see signs of PNA or PE.  I will order an incentive spirometer so she doesn't splint, and give doxycycline as a watch and wait script in case she develops fever, productive cough, etc at home.  I also discussed her thoracic aneurysm.  She can discuss this with Dr Tamala Julian her cardiologist, and I will give a CT surgery referral for monitoring.  I don't think this is a cause of her active pain given how readily reproducible her symptoms are with movement, and that they are pleuritic.   [MT]    Clinical Course User Index [MT] Dakarri Kessinger, Carola Rhine, MD    Final Clinical Impression(s) / ED Diagnoses Final diagnoses:  Pleuritic pain  Compression fracture of T10 vertebra, initial encounter [facility])  Thoracic aortic aneurysm, without rupture [facility])    Rx / DC Orders ED Discharge Orders    None       Omar Gayden, Carola Rhine, MD 07/01/20 [phone]

## 2020-07-01 NOTE — Discharge Instructions (Signed)
Directions -  Follow up with Dr Tamala Julian on Monday about your chest pain and your aneurysm in the chest.  You will need repeat scans in 3-6 months of your chest.  You can also schedule an appointment with a CT surgeon at the number above.  I felt your chest pain may be related to your compression fracture in your spine.  This takes 4-6 weeks to heal.  You should use your incentive spirometer at least 5 times per day, for 10 breaths at a time, for the next 2 weeks.  The goal is for you to take nice, full breaths, and not shallow breaths.  If you start having fevers or cough with mucous, you can fill the doxycycline antibiotic script for pneumonia.  Please read over all the information I've included, paying attention to the reasons to return to the ER.

## 2020-07-01 NOTE — ED Triage Notes (Signed)
Pt reports that she has been having SOB and CP since last night, pain is worse with breathing, hx of afib and RBBB

## 2020-07-04 ENCOUNTER — Telehealth: Payer: Self-pay | Admitting: Internal Medicine

## 2020-07-04 ENCOUNTER — Telehealth: Payer: Self-pay

## 2020-07-04 DIAGNOSIS — C44329 Squamous cell carcinoma of skin of other parts of face: Secondary | ICD-10-CM | POA: Insufficient documentation

## 2020-07-04 NOTE — Telephone Encounter (Signed)
Patient decided to keep Thursday appt.

## 2020-07-04 NOTE — Telephone Encounter (Signed)
Just double checking. Can that be scheduled with you? Just wasn't sure since biopsy.

## 2020-07-04 NOTE — Progress Notes (Signed)
   Subjective:     Patient ID: Hailey [name], female    DOB: [birth year], 85 y.o.   MRN: [id number]  Chief Complaint  Patient presents with  . Follow-up    HPI: The patient is a 85 y.o. female here for follow-up after undergoing excision of glabellar scar (4 cm) and punch biopsy of left cheek lesion on 06/27/2020 with Dr. Claudia Desanctis.  Pathology results (reviewed with patient): Squamous cell carcinoma in situ.  ~ 1.5 weeks PO Patient reports she is doing well.  Forehead incision and left cheek biopsy site are healing nicely, C/D/I.  No signs of infection, redness, drainage, seroma/hematoma.  Sutures were removed today.  Patient denies fever/chills, nausea/vomiting.     Review of Systems  Constitutional: Negative for chills and fever.  HENT: Negative for congestion and rhinorrhea.        Incision healing well  Respiratory: Negative for cough and shortness of breath.   Cardiovascular: Negative for chest pain.  Gastrointestinal: Negative for nausea and vomiting.     Objective:   Vital Signs BP (!) 142/96 (BP Location: Left Arm, Patient Position: Sitting, Cuff Size: Normal)   Pulse 92   Temp 98.5 F (36.9 C) (Temporal)   SpO2 99%  Vital Signs and Nursing Note Reviewed  Physical Exam Constitutional:      General: She is not in acute distress.    Appearance: Normal appearance. She is normal weight. She is not ill-appearing.  HENT:     Head: Normocephalic and atraumatic.     Comments: Forehead incision and left cheek biopsy site are healing nicely, C/D/I.  No signs of infection, redness, drainage, seroma/hematoma.  Sutures were removed today. Eyes:     Extraocular Movements: Extraocular movements intact.  Cardiovascular:     Rate and Rhythm: Normal rate.  Pulmonary:     Effort: Pulmonary effort is normal.  Musculoskeletal:        General: Normal range of motion.     Cervical back: Normal range of motion.  Skin:    General: Skin is warm and dry.     Coloration: Skin is not  pale.     Findings: No erythema or rash.  Neurological:     Mental Status: She is alert and oriented to person, place, and time.     Gait: Gait is intact.  Psychiatric:        Mood and Affect: Mood and affect normal.        Cognition and Memory: Memory normal.        Judgment: Judgment normal.       Assessment/Plan:     ICD-10-CM   1. 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 Scar of forehead  L90.5    Forehead incision is healing very nicely and sutures were removed today.  Patient may apply Vaseline for moisture to the incision.  Left cheek biopsy showed squamous cell carcinoma.  Recommend follow-up with her dermatologist for Mohs procedure. Patient reports her dermatologist has sent her for Mohs procedure before and she really liked the place and would prefer to return there. We are happy to send a referral if needed. Patient reports that she would like to explore possible topical cream for resolution of squamous cell carcinoma.  Recommend she follow-up with her dermatologist to discuss.  Follow-up with our office as needed.  Call office with any questions/concerns.   Threasa Heads, PA-C 07/06/2020, 3:59 PM

## 2020-07-04 NOTE — Telephone Encounter (Signed)
Patient called and was wondering if she could get a call back in regards to her ER over the weekend. She was offered several appointment slots but they did not work for her. She wanted to discuss the findings from her ER visit.    [phone]

## 2020-07-04 NOTE — Telephone Encounter (Signed)
Patient said she saw you recently for a problem. You had recommend use the prescribed medication and follow up in a month if no improvement. She said she does want to wait a couple more weeks because sx are no better now. Said she would like to talk with you.

## 2020-07-04 NOTE — Telephone Encounter (Signed)
I recommend she make an appointment for a biopsy of the area.

## 2020-07-05 NOTE — Progress Notes (Signed)
Cardiology Office Note:    Date:  07/07/2020   ID:  Hailey [name], DOB Aug 22, 1935, MRN [id number]  PCP:  Cassandria Anger, MD  Cardiologist:  Sinclair Grooms, MD   Referring MD: Cassandria Anger, MD   Chief Complaint  Patient presents with  . Coronary Artery Disease  . Atrial Fibrillation  . Hypertension  . Thoracic Aortic Aneurysm    History of Present Illness:    Hailey [name] is a 85 y.o. female with a hx of PSVT, paroxysmal atrial fibrillationonchronic anticoagulation,CAD with PCI DES of RCA 2018,and hypertension, Abnormal prior myocardial perfusion imaging 2671, A/C diastolic HF 10/4578 improved with increased diuresis.  Was having difficulty with with dyspnea earlier in the year.  When last seen by Kathyrn Drown end of June 2021, dyspnea has improved.  Found to have pleuritic chest pain starting on June 29, 2020.  It lingered and eventually led to an emergency room visit on 07/01/2020.  Evaluation done identified a compression fracture in the T10.  She denies fall or trauma.  And a CT scan done to rule out pulmonary embolism also demonstrated an ascending aortic aneurysm measuring 4.5 cm.  This was new when compared to the last prior CT scan.  Earlier this year the shortness of breath that she experienced and which was evaluated by Kathyrn Drown has disappeared.  I note that when [name] was seeing her in June she was back in atrial fibrillation.  She is now back in sinus rhythm.  Paroxysmal atrial fibrillation has had an odd natural history in her.  She went several years in continuous atrial fibrillation, and now goes back and forth sometimes 6 months apart.  Past Medical History:  Diagnosis Date  . Acute blood loss anemia 12/25/2017  . Allergic rhinitis   . Anxiety   . Arthritis    "my whole spine" (07/01/2017)  . Atrial fibrillation ([place])   . Bowel obstruction ([place])    in [place]  . Bradycardia with 41-50 beats per minute 12/27/2017  . Cellulitis of  leg, right with large prepatella hematoma and open wounds 12/26/2017  . CKD (chronic kidney disease) stage 2, GFR 60-89 ml/min   . Colon polyps   . Coronary artery disease    10/18 PCI/DES to p/m LCx with cutting balloon to mLcx  . Diverticulosis of colon   . GERD (gastroesophageal reflux disease)   . Hip bursitis 2010   Dr Para March, Post op seroma  . History of colon polyps   . HSV (herpes simplex virus) anogenital infection 07/2019  . HTN (hypertension)   . IBS (irritable bowel syndrome)    constipation predominant - Dr Earlean Shawl  . Lichen sclerosus   . Osteopenia 11/2016   T score -2.0 FRAX 15%/4.3%  . PAC (premature atrial contraction)    Symptomatiic  . Right bundle branch block (RBBB) on electrocardiogram (ECG) 12/27/2017  . Scoliosis   . SVT (supraventricular tachycardia) ([place])    brief history    Past Surgical History:  Procedure Laterality Date  . ANTERIOR AND POSTERIOR VAGINAL REPAIR  01/2002   Archie Endo 01/23/2011  . APPENDECTOMY  1948  . CARDIAC CATHETERIZATION  06/26/2017  . CORONARY ANGIOPLASTY WITH STENT PLACEMENT  07/01/2017  . CORONARY ATHERECTOMY N/A 07/01/2017   Procedure: CORONARY ATHERECTOMY;  Surgeon: Belva Crome, MD;  Location: [facility];  Service: Cardiovascular;  Laterality: N/A;  . CORONARY STENT INTERVENTION N/A 07/01/2017   Procedure: CORONARY STENT INTERVENTION;  Surgeon: Belva Crome,  MD;  Location: [facility];  Service: Cardiovascular;  Laterality: N/A;  . HAMMER TOE SURGERY    . HEMORRHOID BANDING    . HIP SURGERY Left 04/2009   hip examination under anesthesia followed by greater trochanteric bursectomy; iliotibial band tenotomy/notes 01/20/2011  . I & D EXTREMITY Right 01/10/2018   Procedure: IRRIGATION AND DEBRIDEMENT RIGHT KNEE, APPLY WOUND VAC;  Surgeon: Newt Minion, MD;  Location: [facility];  Service: Orthopedics;  Laterality: Right;  . KNEE BURSECTOMY Right 04/2009   Archie Endo 01/09/2011  . LEFT HEART CATH AND CORONARY ANGIOGRAPHY  N/A 06/26/2017   Procedure: LEFT HEART CATH AND CORONARY ANGIOGRAPHY;  Surgeon: Belva Crome, MD;  Location: [facility];  Service: Cardiovascular;  Laterality: N/A;  . PUBOVAGINAL SLING  01/2002   Archie Endo 01/23/2011  . REDUCTION MAMMAPLASTY    . TEMPORARY PACEMAKER N/A 07/01/2017   Procedure: TEMPORARY PACEMAKER;  Surgeon: Belva Crome, MD;  Location: [facility];  Service: Cardiovascular;  Laterality: N/A;  . VAGINAL HYSTERECTOMY  01/2002   Vaginal hysterectomy, bilateral salpingo-oophorectomy/notes 01/23/2011    Current Medications: Current Meds  Medication Sig  . acetaminophen (TYLENOL) 500 MG tablet Take 1,000 mg by mouth every 6 (six) hours as needed (Coated or capsules).  Marland Kitchen atorvastatin (LIPITOR) 80 MG tablet TAKE 1 TABLET DAILY AT 6PM.  . betamethasone valerate ointment (VALISONE) 0.1 % Apply 1 application topically 2 (two) times daily. Apply twice a day for 4 weeks, once a day for 4 weeks, then twice weekly  . cetirizine (ZYRTEC) 10 MG tablet Take 10 mg by mouth daily.  . clobetasol cream (TEMOVATE) 0.05 % APPLY NIGHTLY AS DIRECTED.  Marland Kitchen dicyclomine (BENTYL) 20 MG tablet Take 1 tablet (20 mg total) by mouth 2 (two) times daily.  . DULoxetine (CYMBALTA) 60 MG capsule TAKE 1 CAPSULE DAILY.  Marland Kitchen ELIQUIS 2.5 MG TABS tablet TAKE 1 TABLET BY MOUTH TWICE DAILY.  Marland Kitchen esomeprazole (NEXIUM) 40 MG capsule TAKE (1) CAPSULE DAILY.  . fosfomycin (MONUROL) 3 g PACK TAKE CONTENTS OF 1 PACKET ONCE FOR 1 DOSE.  . hyoscyamine (LEVSIN) 0.125 MG tablet TAKE 1-2 TABLETS (0.125-0.25 MG) BY MOUTH EVERY 4 HOURS AS NEEDED FOR UP TO 10 DAYS FOR CRAMPING.  . LORazepam (ATIVAN) 1 MG tablet TAKE 1 TABLET TWICE DAILY AS NEEDED FOR ANXIETY/SLEEP.  Marland Kitchen metoprolol succinate (TOPROL XL) 25 MG 24 hr tablet Take 1 tablet (25 mg total) by mouth daily.  . nitroGLYCERIN (NITROSTAT) 0.4 MG SL tablet Place 0.4 mg under the tongue every 5 (five) minutes as needed for chest pain (Call 911 at 3rd dose within 15  minutes.).   Marland Kitchen NONFORMULARY OR COMPOUNDED ITEM Estradiol 0.02% vag cream prefilled applicators  S:Insert appful hs x first 2 weeks of use then insert appful (1 gram) intravaginally twice weekly.  . ondansetron (ZOFRAN ODT) 4 MG disintegrating tablet Take 1 tablet (4 mg total) by mouth every 8 (eight) hours as needed for nausea or vomiting.  . Probiotic Product (ALIGN PO) Take 1 capsule by mouth daily.  . sodium chloride (OCEAN) 0.65 % SOLN nasal spray Place 1 spray into both nostrils as needed for congestion.  . traMADol (ULTRAM) 50 MG tablet Take 0.5-1 tablets (25-50 mg total) by mouth every 6 (six) hours as needed for severe pain.  . valACYclovir (VALTREX) 500 MG tablet Take one tablet by mouth twice daily for 5 days for early onset symptoms.     Allergies:   Macrobid [nitrofurantoin monohyd macro], Meloxicam,  Digoxin and related, Mobic [meloxicam], and Sulfamethoxazole-trimethoprim   Social History   Socioeconomic History  . Marital status: Married    Spouse name: Not on file  . Number of children: 2  . Years of education: Not on file  . Highest education level: Not on file  Occupational History  . Occupation: Patent attorney: RETIRED  Tobacco Use  . Smoking status: Former Smoker    Packs/day: 0.25    Years: 28.00    Pack years: 7.00    Types: Cigarettes    Quit date: 1981    Years since quitting: 40.8  . Smokeless tobacco: Never Used  Vaping Use  . Vaping Use: Never used  Substance and Sexual Activity  . Alcohol use: Yes    Alcohol/week: 7.0 standard drinks    Types: 7 Standard drinks or equivalent per week    Comment: 7 vodka drinks a week  . Drug use: No  . Sexual activity: Yes    Birth control/protection: Surgical    Comment: 1st intercourse 85 yo--Fewer than 5 partners  Other Topics Concern  . Not on file  Social History Narrative   Regular Exercise -  YES         Social Determinants of Health   Financial Resource Strain:   . Difficulty of Paying  Living Expenses: Not on file  Food Insecurity:   . Worried About Charity fundraiser in the Last Year: Not on file  . Ran Out of Food in the Last Year: Not on file  Transportation Needs:   . Lack of Transportation (Medical): Not on file  . Lack of Transportation (Non-Medical): Not on file  Physical Activity:   . Days of Exercise per Week: Not on file  . Minutes of Exercise per Session: Not on file  Stress:   . Feeling of Stress : Not on file  Social Connections:   . Frequency of Communication with Friends and Family: Not on file  . Frequency of Social Gatherings with Friends and Family: Not on file  . Attends Religious Services: Not on file  . Active Member of Clubs or Organizations: Not on file  . Attends Archivist Meetings: Not on file  . Marital Status: Not on file     Family History: The patient's family history includes Colon cancer in her mother; Diabetes in her father; Pancreatic cancer in her brother; Prostate cancer in her brother and father; Stomach cancer in her son. There is no history of Heart attack, Stroke, or Esophageal cancer.  ROS:   Please see the history of present illness.    The pleuritic pain has dissipated.  No recent fall or injury.  All other systems reviewed and are negative.  EKGs/Labs/Other Studies Reviewed:    The following studies were reviewed today: CT SCAN 07/01/20: IMPRESSION: 1. Negative for pulmonary embolism. 2. Mild bibasilar atelectasis.  Lungs are otherwise clear. 3. Mid ascending thoracic aorta measures 4.5 cm in diameter (previously measured 4.0 cm on 07/03/2014). Ascending thoracic aortic aneurysm. Recommend semi-annual imaging followup by CTA or MRA and referral to cardiothoracic surgery if not already obtained. This recommendation follows 2010 ACCF/AHA/AATS/ACR/ASA/SCA/SCAI/SIR/STS/SVM Guidelines for the Diagnosis and Management of Patients With Thoracic Aortic Disease. Circulation. 2010; 121: R427-C623. Aortic  aneurysm NOS (ICD10-I71.9) 4. Cardiomegaly. 5. Aortic and coronary artery atherosclerosis.  (ICD10-I70.0). 6. Chronic T10 compression fracture.  EKG:  EKG Baseline artifact is noted on the tracing from 07/05/2020.  It does appear the sinus rhythm is  present.  Low voltage is noted.  Furthermore, the tracing showed right bundle, first-degree AV block.  Recent Labs: 02/24/2020: ALT 12 02/29/2020: NT-Pro BNP 2,147 07/01/2020: BUN 24; Creatinine, Ser 1.17; Hemoglobin 9.7; Platelets 241; Potassium 4.3; Sodium 136  Recent Lipid Panel    Component Value Date/Time   CHOL 171 11/26/2016 0844   TRIG 99 11/26/2016 0844   TRIG 183 (H) 07/26/2006 0903   HDL 90 11/26/2016 0844   CHOLHDL 1.9 11/26/2016 0844   CHOLHDL 2.2 07/04/2014 0528   VLDL 19 07/04/2014 0528   LDLCALC 61 11/26/2016 0844   LDLDIRECT 89.1 06/29/2013 1604    Physical Exam:    VS:  BP 140/88   Pulse 74   Ht 5\' 3"  (1.6 m)   Wt 145 lb 1.6 oz (65.8 kg)   SpO2 96%   BMI 25.70 kg/m     Wt Readings from Last 3 Encounters:  07/07/20 145 lb 1.6 oz (65.8 kg)  07/01/20 149 lb 14.6 oz (68 kg)  03/07/20 150 lb (68 kg)     GEN: Obese older gentleman Xarelto. No acute distress HEENT: Normal NECK: No JVD. LYMPHATICS: No lymphadenopathy CARDIAC:  RRR without murmur, gallop, or edema. VASCULAR:  Normal Pulses. No bruits. RESPIRATORY:  Clear to auscultation without rales, wheezing or rhonchi  ABDOMEN: Soft, non-tender, non-distended, No pulsatile mass, MUSCULOSKELETAL: No deformity  SKIN: Warm and dry NEUROLOGIC:  Alert and oriented x 3 PSYCHIATRIC:  Normal affect   ASSESSMENT:    1. Permanent atrial fibrillation ([place])   2. Thoracic aortic aneurysm without rupture ([place])   3. Coronary artery disease involving native coronary artery of native heart with angina pectoris ([place])   4. Chronic diastolic HF (heart failure) ([place])   5. Primary hypertension   6. Hyperlipidemia LDL goal <70   7. Shortness of breath   8. Chronic  anticoagulation   9. 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 Right bundle branch block (RBBB) on electrocardiogram (ECG)   11. Educated about COVID-19 virus infection    PLAN:    In order of problems listed above:  1. She is not in permanent atrial fibrillation.  She is currently in sinus rhythm with PACs documented by EKGs in the emergency room.  It is now clear that the episodes of fatigue and dyspnea on exertion are related to prolonged atrial fibrillation.  She clearly is atypical in that she can have long stretches of atrial fibrillation and then converted to sinus rhythm.  She will need to continue Eliquis therapy indefinitely. 2. Ascending aortic aneurysm, 4.5 cm, identified on CT scan done for purposes of ruling out pulmonary embolism.  No PE was found.  Aorta will be followed with neck study done in 6 months to monitor expansion.  Add losartan 25 mg/day.  Continue Toprol-XL 25 mg/day. 3. No symptoms of angina.  Secondary prevention briefly reviewed. 4. Diastolic heart failure symptoms are present when she is in atrial fibrillation.  Diuretic therapy makes her feel worse. 5. Losartan 25 mg/day is added to Toprol-XL to better control blood pressure to 130/80 mmHg or less.  Bmet will be done in 2 to 3 weeks.   6. LDL target less than 70.  Continue high intensity statin therapy with Lipitor 80 mg/day. 7. Shortness of breath is resolved with return of sinus rhythm. 8. Continue Eliquis 5 mg twice daily 9. Not discussed 10. Not discussed 11. Had booster Pfizer mRNA vaccine on September 28.  There is a chance that the pleuritic pain could  be related to inflammation from the booster.  No way to be sure.     Medication Adjustments/Labs and Tests Ordered: Current medicines are reviewed at length with the patient today.  Concerns regarding medicines are outlined above.  Orders Placed This Encounter  Procedures  . CT ANGIO CHEST AORTA W/CM & OR WO/CM   No orders of the defined types were placed in  this encounter.   Patient Instructions  Medication Instructions:  Your physician recommends that you continue on your current medications as directed. Please refer to the Current Medication list given to you today.  *If you need a refill on your cardiac medications before your next appointment, please call your pharmacy*   Lab Work: None If you have labs (blood work) drawn today and your tests are completely normal, you will receive your results only by: Marland Kitchen MyChart Message (if you have MyChart) OR . A paper copy in the mail If you have any lab test that is abnormal or we need to change your treatment, we will call you to review the results.   Testing/Procedures: Your physician recommends that you have a Chest CT performed in 6 months and then see him after.    Follow-Up: At [facility], you and your health needs are our priority.  As part of our continuing mission to provide you with exceptional heart care, we have created designated Provider Care Teams.  These Care Teams include your primary Cardiologist (physician) and Advanced Practice Providers (APPs -  Physician Assistants and Nurse Practitioners) who all work together to provide you with the care you need, when you need it.  We recommend signing up for the patient portal called "MyChart".  Sign up information is provided on this After Visit Summary.  MyChart is used to connect with patients for Virtual Visits (Telemedicine).  Patients are able to view lab/test results, encounter notes, upcoming appointments, etc.  Non-urgent messages can be sent to your provider as well.   To learn more about what you can do with MyChart, go to [email or web address].    Your next appointment:   6 month(s)  The format for your next appointment:   In Person  Provider:   You may see Sinclair Grooms, MD or one of the following Advanced Practice Providers on your designated Care Team:    Truitt Merle, NP  Cecilie Kicks, NP  Kathyrn Drown, NP    Other Instructions      Signed, Sinclair Grooms, MD  07/07/2020 1:52 PM    [place]

## 2020-07-06 ENCOUNTER — Ambulatory Visit: Payer: Medicare Other | Admitting: Plastic Surgery

## 2020-07-06 ENCOUNTER — Other Ambulatory Visit: Payer: Self-pay

## 2020-07-06 ENCOUNTER — Encounter: Payer: Self-pay | Admitting: Plastic Surgery

## 2020-07-06 VITALS — BP 142/96 | HR 92 | Temp 98.5°F

## 2020-07-06 DIAGNOSIS — L905 Scar conditions and fibrosis of skin: Secondary | ICD-10-CM

## 2020-07-06 DIAGNOSIS — C44329 Squamous cell carcinoma of skin of other parts of face: Secondary | ICD-10-CM

## 2020-07-07 ENCOUNTER — Encounter: Payer: Self-pay | Admitting: Interventional Cardiology

## 2020-07-07 ENCOUNTER — Ambulatory Visit: Payer: Medicare Other | Admitting: Interventional Cardiology

## 2020-07-07 ENCOUNTER — Ambulatory Visit (INDEPENDENT_AMBULATORY_CARE_PROVIDER_SITE_OTHER): Payer: Medicare Other | Admitting: Internal Medicine

## 2020-07-07 ENCOUNTER — Encounter: Payer: Self-pay | Admitting: Internal Medicine

## 2020-07-07 VITALS — BP 140/88 | HR 74 | Ht 63.0 in | Wt 145.1 lb

## 2020-07-07 DIAGNOSIS — Z7901 Long term (current) use of anticoagulants: Secondary | ICD-10-CM

## 2020-07-07 DIAGNOSIS — R0789 Other chest pain: Secondary | ICD-10-CM

## 2020-07-07 DIAGNOSIS — I712 Thoracic aortic aneurysm, without rupture, unspecified: Secondary | ICD-10-CM

## 2020-07-07 DIAGNOSIS — I25119 Atherosclerotic heart disease of native coronary artery with unspecified angina pectoris: Secondary | ICD-10-CM

## 2020-07-07 DIAGNOSIS — I5032 Chronic diastolic (congestive) heart failure: Secondary | ICD-10-CM

## 2020-07-07 DIAGNOSIS — I4510 Unspecified right bundle-branch block: Secondary | ICD-10-CM

## 2020-07-07 DIAGNOSIS — R0602 Shortness of breath: Secondary | ICD-10-CM

## 2020-07-07 DIAGNOSIS — I10 Essential (primary) hypertension: Secondary | ICD-10-CM

## 2020-07-07 DIAGNOSIS — Z7189 Other specified counseling: Secondary | ICD-10-CM

## 2020-07-07 DIAGNOSIS — E785 Hyperlipidemia, unspecified: Secondary | ICD-10-CM

## 2020-07-07 DIAGNOSIS — I4821 Permanent atrial fibrillation: Secondary | ICD-10-CM

## 2020-07-07 DIAGNOSIS — D681 Hereditary factor XI deficiency: Secondary | ICD-10-CM

## 2020-07-07 MED ORDER — LOSARTAN POTASSIUM 25 MG PO TABS: 25.0000 mg | ORAL_TABLET | Freq: Every day | ORAL | 3 refills | Status: DC

## 2020-07-07 NOTE — Patient Instructions (Addendum)
Medication Instructions:  1) START Losartan 25mg  once daily  *If you need a refill on your cardiac medications before your next appointment, please call your pharmacy*   Lab Work: BMET in 2 weeks  If you have labs (blood work) drawn today and your tests are completely normal, you will receive your results only by: Marland Kitchen MyChart Message (if you have MyChart) OR . A paper copy in the mail If you have any lab test that is abnormal or we need to change your treatment, we will call you to review the results.   Testing/Procedures: Your physician recommends that you have a Chest CT performed in 6 months and then see him after.    Follow-Up: At [facility], you and your health needs are our priority.  As part of our continuing mission to provide you with exceptional heart care, we have created designated Provider Care Teams.  These Care Teams include your primary Cardiologist (physician) and Advanced Practice Providers (APPs -  Physician Assistants and Nurse Practitioners) who all work together to provide you with the care you need, when you need it.  We recommend signing up for the patient portal called "MyChart".  Sign up information is provided on this After Visit Summary.  MyChart is used to connect with patients for Virtual Visits (Telemedicine).  Patients are able to view lab/test results, encounter notes, upcoming appointments, etc.  Non-urgent messages can be sent to your provider as well.   To learn more about what you can do with MyChart, go to [email or web address].    Your next appointment:   6 month(s)  The format for your next appointment:   In Person  Provider:   You may see Sinclair Grooms, MD or one of the following Advanced Practice Providers on your designated Care Team:    Truitt Merle, NP  Cecilie Kicks, NP  Kathyrn Drown, NP    Other Instructions

## 2020-07-07 NOTE — Progress Notes (Signed)
Subjective:  Patient ID: Hailey [name], female    DOB: 05-12-1935  Age: 85 y.o. MRN: [id number]  CC: Follow-up [facility] F/U)   HPI Hailey [name] presents for CP after COVID 19 booster - the pt went to ER - records reviewed  Outpatient Medications Prior to Visit  Medication Sig Dispense Refill  . acetaminophen (TYLENOL) 500 MG tablet Take 1,000 mg by mouth every 6 (six) hours as needed (Coated or capsules).    Marland Kitchen atorvastatin (LIPITOR) 80 MG tablet TAKE 1 TABLET DAILY AT 6PM. 90 tablet 2  . betamethasone valerate ointment (VALISONE) 0.1 % Apply 1 application topically 2 (two) times daily. Apply twice a day for 4 weeks, once a day for 4 weeks, then twice weekly 30 g 2  . cetirizine (ZYRTEC) 10 MG tablet Take 10 mg by mouth daily.    . DULoxetine (CYMBALTA) 60 MG capsule TAKE 1 CAPSULE DAILY. 90 capsule 1  . ELIQUIS 2.5 MG TABS tablet TAKE 1 TABLET BY MOUTH TWICE DAILY. 180 tablet 1  . esomeprazole (NEXIUM) 40 MG capsule TAKE (1) CAPSULE DAILY. 90 capsule 1  . hyoscyamine (LEVSIN) 0.125 MG tablet TAKE 1-2 TABLETS (0.125-0.25 MG) BY MOUTH EVERY 4 HOURS AS NEEDED FOR UP TO 10 DAYS FOR CRAMPING. 100 tablet 1  . LORazepam (ATIVAN) 1 MG tablet TAKE 1 TABLET TWICE DAILY AS NEEDED FOR ANXIETY/SLEEP. 180 tablet 0  . losartan (COZAAR) 25 MG tablet Take 1 tablet (25 mg total) by mouth daily. 90 tablet 3  . metoprolol succinate (TOPROL XL) 25 MG 24 hr tablet Take 1 tablet (25 mg total) by mouth daily. 90 tablet 3  . nitroGLYCERIN (NITROSTAT) 0.4 MG SL tablet Place 0.4 mg under the tongue every 5 (five) minutes as needed for chest pain (Call 911 at 3rd dose within 15 minutes.).     Marland Kitchen NONFORMULARY OR COMPOUNDED ITEM Estradiol 0.02% vag cream prefilled applicators  S:Insert appful hs x first 2 weeks of use then insert appful (1 gram) intravaginally twice weekly. 24 each 3  . ondansetron (ZOFRAN ODT) 4 MG disintegrating tablet Take 1 tablet (4 mg total) by mouth every 8 (eight) hours as needed for  nausea or vomiting. 20 tablet 0  . Probiotic Product (ALIGN PO) Take 1 capsule by mouth daily.    . sodium chloride (OCEAN) 0.65 % SOLN nasal spray Place 1 spray into both nostrils as needed for congestion.    . traMADol (ULTRAM) 50 MG tablet Take 0.5-1 tablets (25-50 mg total) by mouth every 6 (six) hours as needed for severe pain. 20 tablet 0  . clobetasol cream (TEMOVATE) 0.05 % APPLY NIGHTLY AS DIRECTED. (Patient not taking: Reported on 07/07/2020) 60 g 1  . dicyclomine (BENTYL) 20 MG tablet Take 1 tablet (20 mg total) by mouth 2 (two) times daily. (Patient not taking: Reported on 07/07/2020) 60 tablet 1  . fosfomycin (MONUROL) 3 g PACK TAKE CONTENTS OF 1 PACKET ONCE FOR 1 DOSE. (Patient not taking: Reported on 07/07/2020) 1 each 0  . valACYclovir (VALTREX) 500 MG tablet Take one tablet by mouth twice daily for 5 days for early onset symptoms. (Patient not taking: Reported on 07/07/2020) 30 tablet 1   No facility-administered medications prior to visit.    ROS: Review of Systems  Constitutional: Negative for activity change, appetite change, chills, fatigue and unexpected weight change.  HENT: Negative for congestion, mouth sores and sinus pressure.   Eyes: Negative for visual disturbance.  Respiratory: Negative for cough, chest tightness and  shortness of breath.   Cardiovascular: Positive for chest pain.  Gastrointestinal: Negative for abdominal pain and nausea.  Genitourinary: Negative for difficulty urinating, frequency and vaginal pain.  Musculoskeletal: Negative for back pain and gait problem.  Skin: Negative for pallor and rash.  Neurological: Negative for dizziness, tremors, weakness, numbness and headaches.  Hematological: Bruises/bleeds easily.  Psychiatric/Behavioral: Negative for confusion and sleep disturbance.    Objective:  BP (!) 142/90 (BP Location: Left Arm)   Pulse 65   Temp 98.2 F (36.8 C) (Oral)   Wt 145 lb 6.4 oz (66 kg)   SpO2 99%   BMI 25.76 kg/m   BP  Readings from Last 3 Encounters:  07/07/20 (!) 142/90  07/07/20 140/88  07/06/20 (!) 142/96    Wt Readings from Last 3 Encounters:  07/07/20 145 lb 6.4 oz (66 kg)  07/07/20 145 lb 1.6 oz (65.8 kg)  07/01/20 149 lb 14.6 oz (68 kg)    Physical Exam Constitutional:      General: She is not in acute distress.    Appearance: She is well-developed. She is obese. She is not ill-appearing.  HENT:     Head: Normocephalic.     Right Ear: External ear normal.     Left Ear: External ear normal.     Nose: Nose normal.  Eyes:     General:        Right eye: No discharge.        Left eye: No discharge.     Conjunctiva/sclera: Conjunctivae normal.     Pupils: Pupils are equal, round, and reactive to light.  Neck:     Thyroid: No thyromegaly.     Vascular: No JVD.     Trachea: No tracheal deviation.  Cardiovascular:     Rate and Rhythm: Normal rate and regular rhythm.     Heart sounds: Normal heart sounds.  Pulmonary:     Effort: No respiratory distress.     Breath sounds: No stridor. No wheezing.  Abdominal:     General: Bowel sounds are normal. There is no distension.     Palpations: Abdomen is soft. There is no mass.     Tenderness: There is no abdominal tenderness. There is no guarding or rebound.  Musculoskeletal:        General: No tenderness.     Cervical back: Normal range of motion and neck supple.  Lymphadenopathy:     Cervical: No cervical adenopathy.  Skin:    Findings: No erythema or rash.  Neurological:     Cranial Nerves: No cranial nerve deficit.     Motor: No abnormal muscle tone.     Coordination: Coordination normal.     Deep Tendon Reflexes: Reflexes normal.  Psychiatric:        Behavior: Behavior normal.        Thought Content: Thought content normal.        Judgment: Judgment normal.        A total time of >25 minutes was spent preparing to see the patient, reviewing tests, x-rays, operative reports and outside records.  Also, obtaining history and  performing comprehensive physical exam.  Additionally, counseling the patient regarding the above listed issues.   Finally, documenting clinical information in the health records, coordination of care, educating the patient. It is a complex case.  Lab Results  Component Value Date   WBC 7.8 07/01/2020   HGB 9.7 (L) 07/01/2020   HCT 30.7 (L) 07/01/2020   PLT 241 07/01/2020  GLUCOSE 102 (H) 07/01/2020   CHOL 171 11/26/2016   TRIG 99 11/26/2016   HDL 90 11/26/2016   LDLDIRECT 89.1 06/29/2013   LDLCALC 61 11/26/2016   ALT 12 02/24/2020   AST 25 02/24/2020   NA 136 07/01/2020   K 4.3 07/01/2020   CL 105 07/01/2020   CREATININE 1.17 (H) 07/01/2020   BUN 24 (H) 07/01/2020   CO2 22 07/01/2020   TSH 2.11 04/29/2015   INR 1.1 12/02/2019   HGBA1C 5.6 07/04/2014    DG Chest 2 View  Result Date: 07/01/2020 CLINICAL DATA:  Chest pain since yesterday, occasionally moving to throat, history of atrial fibrillation, coronary artery disease post stenting, hypertension EXAM: CHEST - 2 VIEW COMPARISON:  02/24/2020 FINDINGS: Minimal enlargement of cardiac silhouette. Atherosclerotic calcification and tortuosity of thoracic aorta. Pulmonary vascularity normal. Bibasilar atelectasis. No acute infiltrate, pleural effusion or pneumothorax. Diffuse osseous demineralization with scoliosis and degenerative disc disease changes thoracic spine. IMPRESSION: Enlargement of cardiac silhouette with bibasilar atelectasis. Aortic Atherosclerosis (ICD10-I70.0). Electronically Signed   By: Lavonia Dana M.D.   On: 07/01/2020 07:23   CT Angio Chest PE W and/or Wo Contrast  Result Date: 07/01/2020 CLINICAL DATA:  Pleuritic chest pain, shortness of breath EXAM: CT ANGIOGRAPHY CHEST WITH CONTRAST TECHNIQUE: Multidetector CT imaging of the chest was performed using the standard protocol during bolus administration of intravenous contrast. Multiplanar CT image reconstructions and MIPs were obtained to evaluate the vascular  anatomy. CONTRAST:  28mL OMNIPAQUE IOHEXOL 350 MG/ML SOLN COMPARISON:  Chest x-ray 07/01/2020. CT chest 02/03/2013, 07/03/2014 FINDINGS: Cardiovascular: Satisfactory opacification of the pulmonary arteries to the segmental level. No evidence of pulmonary embolism. Main pulmonary trunk measures 3.2 cm in diameter. Mid ascending thoracic aorta measures 4.5 cm in diameter (previously measured 4.0 cm on 07/03/2014). Mid aortic arch measures 3.2 cm in diameter (previously 2.8 cm). There is tortuosity of the thoracic aorta. Mild atherosclerotic calcification of the aorta. Coronary artery calcifications are present. Cardiomegaly with left greater than right atrial enlargement. No pericardial effusion. Mediastinum/Nodes: No axillary, mediastinal, or hilar lymphadenopathy. No thyroid abnormality. Trachea and esophagus within normal limits. Lungs/Pleura: Mild bibasilar subsegmental atelectasis, left greater than right. No focal airspace consolidation. No pleural effusion. No pneumothorax. Upper Abdomen: No acute findings within the visualized upper abdomen. Musculoskeletal: Chronic superior and inferior endplate compression fracture of the T10 vertebral body which contains a large intraosseous hemangioma. No new or acute osseous findings. No chest wall abnormality. Review of the MIP images confirms the above findings. IMPRESSION: 1. Negative for pulmonary embolism. 2. Mild bibasilar atelectasis.  Lungs are otherwise clear. 3. Mid ascending thoracic aorta measures 4.5 cm in diameter (previously measured 4.0 cm on 07/03/2014). Ascending thoracic aortic aneurysm. Recommend semi-annual imaging followup by CTA or MRA and referral to cardiothoracic surgery if not already obtained. This recommendation follows 2010 ACCF/AHA/AATS/ACR/ASA/SCA/SCAI/SIR/STS/SVM Guidelines for the Diagnosis and Management of Patients With Thoracic Aortic Disease. Circulation. 2010; 121: R154-M086. Aortic aneurysm NOS (ICD10-I71.9) 4. Cardiomegaly. 5.  Aortic and coronary artery atherosclerosis.  (ICD10-I70.0). 6. Chronic T10 compression fracture. Electronically Signed   By: Davina Poke D.O.   On: 07/01/2020 13:21    Assessment & Plan:    Walker Kehr, MD

## 2020-07-07 NOTE — Assessment & Plan Note (Addendum)
Transient pericarditis(?) or pleurisy - 3 wks after a COVID 19 booster. Resolved

## 2020-07-18 ENCOUNTER — Encounter: Payer: Self-pay | Admitting: Internal Medicine

## 2020-07-18 ENCOUNTER — Ambulatory Visit: Payer: Medicare Other | Admitting: Nurse Practitioner

## 2020-07-19 ENCOUNTER — Other Ambulatory Visit: Payer: Self-pay | Admitting: Internal Medicine

## 2020-07-20 ENCOUNTER — Other Ambulatory Visit: Payer: Self-pay

## 2020-07-20 ENCOUNTER — Ambulatory Visit: Payer: Medicare Other | Admitting: Nurse Practitioner

## 2020-07-20 ENCOUNTER — Encounter: Payer: Self-pay | Admitting: Nurse Practitioner

## 2020-07-20 VITALS — BP 124/80

## 2020-07-20 DIAGNOSIS — N9089 Other specified noninflammatory disorders of vulva and perineum: Secondary | ICD-10-CM

## 2020-07-20 MED ORDER — VALACYCLOVIR HCL 1 G PO TABS: 1000.0000 mg | ORAL_TABLET | Freq: Every day | ORAL | 2 refills | Status: AC

## 2020-07-20 NOTE — Progress Notes (Signed)
   Acute Office Visit  Subjective:    Patient ID: Hailey [name], female    DOB: [birth year], 85 y.o.   MRN: [id number]   HPI 85 y.o. presents today for persistent vulvar pain. History of LC and was treated with clobetasol 06/22/2020 with some general improvement but an area on the left inner labia remains bothersome despite treatment. It is painful to touch and burns with urination. 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    Review of Systems  Genitourinary: Positive for genital sores and vaginal pain (left inner labia).       Objective:    Physical Exam Constitutional:      Appearance: Normal appearance.  Genitourinary:      BP 124/80 (BP Location: Right Arm, Patient Position: Sitting, Cuff Size: Normal)  Wt Readings from Last 3 Encounters:  07/07/20 145 lb 6.4 oz (66 kg)  07/07/20 145 lb 1.6 oz (65.8 kg)  07/01/20 149 lb 14.6 oz (68 kg)        Assessment & Plan:   Problem List Items Addressed This Visit    None    Visit Diagnoses    Vulvar lesion    -  Primary   Relevant Medications   valACYclovir (VALTREX) 1000 MG tablet   Other Relevant Orders   SureSwab HSV, Type 1/2 DNA, PCR     Plan: HSV culture obtained. Will begin treatment with Valacyclovir 1 GM daily for 5 days. Will triage based on culture results. If HSV negative or symptoms do not improve we will obtain a biopsy. She is agreeable to plan.      Tamela Gammon [facility], 3:15 PM 07/20/2020

## 2020-07-21 ENCOUNTER — Other Ambulatory Visit: Payer: Medicare Other | Admitting: *Deleted

## 2020-07-21 DIAGNOSIS — I5032 Chronic diastolic (congestive) heart failure: Secondary | ICD-10-CM

## 2020-07-21 DIAGNOSIS — I10 Essential (primary) hypertension: Secondary | ICD-10-CM

## 2020-07-22 LAB — BASIC METABOLIC PANEL
BUN/Creatinine Ratio: 18 (ref 12–28)
BUN: 20 mg/dL (ref 8–27)
CO2: 22 mmol/L (ref 20–29)
Calcium: 9.2 mg/dL (ref 8.7–10.3)
Chloride: 103 mmol/L (ref 96–106)
Creatinine, Ser: 1.09 mg/dL — ABNORMAL HIGH (ref 0.57–1.00)
GFR calc Af Amer: 54 mL/min/{1.73_m2} — ABNORMAL LOW (ref >59)
GFR calc non Af Amer: 47 mL/min/{1.73_m2} — ABNORMAL LOW (ref >59)
Glucose: 75 mg/dL (ref 65–99)
Potassium: 4.6 mmol/L (ref 3.5–5.2)
Sodium: 138 mmol/L (ref 134–144)

## 2020-07-22 LAB — SURESWAB HSV, TYPE 1/2 DNA, PCR
HSV 1 DNA: NOT DETECTED
HSV 2 DNA: NOT DETECTED

## 2020-07-25 DIAGNOSIS — D0439 Carcinoma in situ of skin of other parts of face: Secondary | ICD-10-CM | POA: Diagnosis not present

## 2020-07-27 ENCOUNTER — Telehealth: Payer: Self-pay | Admitting: Interventional Cardiology

## 2020-07-27 NOTE — Telephone Encounter (Signed)
Patient states for the past 6 months she has been applying Voltaren ointment, as needed, to relieve arthritis. She would like to ensure that this will not cause heart-related issues. Please advise.

## 2020-07-27 NOTE — Telephone Encounter (Signed)
Ok to use topical Voltaren as needed - absorption is lower than with oral NSAIDs.

## 2020-07-27 NOTE — Telephone Encounter (Signed)
Supple, Megan E, RPH-CPP 7 minutes ago (3:20 PM)     Ok to use topical Voltaren as needed - absorption is lower than with oral NSAIDs.      Called the patient and made her aware of the above recommendations from PharmD. She verbalized understanding.

## 2020-07-28 ENCOUNTER — Ambulatory Visit: Payer: Medicare Other | Admitting: Obstetrics and Gynecology

## 2020-07-28 ENCOUNTER — Encounter: Payer: Self-pay | Admitting: Obstetrics and Gynecology

## 2020-07-28 ENCOUNTER — Other Ambulatory Visit: Payer: Self-pay

## 2020-07-28 VITALS — BP 122/80

## 2020-07-28 DIAGNOSIS — N9089 Other specified noninflammatory disorders of vulva and perineum: Secondary | ICD-10-CM

## 2020-07-28 DIAGNOSIS — L292 Pruritus vulvae: Secondary | ICD-10-CM

## 2020-07-28 DIAGNOSIS — L900 Lichen sclerosus et atrophicus: Secondary | ICD-10-CM | POA: Diagnosis not present

## 2020-07-28 NOTE — Progress Notes (Signed)
Hailey [name] June 29, 1935 [id number]  SUBJECTIVE:  85 y.o. G28P2001 female presents for a vulvar biopsy.  History of lichen sclerosis and a recent flare of vulvar irritation/itching symptoms after treatment for UTI 05/30/2020.  Saw Marny Lowenstein, NP and she was treated with clobetasol 06/22/2020 but has had persistent itching on the left inner labia which continues to be ongoing.  Tried a course of Valtrex 1 g daily for 5 days with a due to history of HSV, but the HSV swab was negative and her symptoms did not improve after that treatment.  Current Outpatient Medications  Medication Sig Dispense Refill  . acetaminophen (TYLENOL) 500 MG tablet Take 1,000 mg by mouth every 6 (six) hours as needed (Coated or capsules).    Marland Kitchen atorvastatin (LIPITOR) 80 MG tablet TAKE 1 TABLET DAILY AT 6PM. 90 tablet 2  . betamethasone valerate ointment (VALISONE) 0.1 % Apply 1 application topically 2 (two) times daily. Apply twice a day for 4 weeks, once a day for 4 weeks, then twice weekly 30 g 2  . cetirizine (ZYRTEC) 10 MG tablet Take 10 mg by mouth daily.    . DULoxetine (CYMBALTA) 60 MG capsule TAKE 1 CAPSULE DAILY. 90 capsule 1  . ELIQUIS 2.5 MG TABS tablet TAKE 1 TABLET BY MOUTH TWICE DAILY. 180 tablet 1  . esomeprazole (NEXIUM) 40 MG capsule TAKE (1) CAPSULE DAILY. 90 capsule 1  . hyoscyamine (LEVSIN) 0.125 MG tablet TAKE 1-2 TABLETS (0.125-0.25 MG) BY MOUTH EVERY 4 HOURS AS NEEDED FOR UP TO 10 DAYS FOR CRAMPING. 100 tablet 1  . LORazepam (ATIVAN) 1 MG tablet TAKE 1 TABLET TWICE DAILY AS NEEDED FOR ANXIETY/SLEEP. 180 tablet 0  . losartan (COZAAR) 25 MG tablet Take 1 tablet (25 mg total) by mouth daily. 90 tablet 3  . metoprolol succinate (TOPROL XL) 25 MG 24 hr tablet Take 1 tablet (25 mg total) by mouth daily. 90 tablet 3  . nitroGLYCERIN (NITROSTAT) 0.4 MG SL tablet Place 0.4 mg under the tongue every 5 (five) minutes as needed for chest pain (Call 911 at 3rd dose within 15 minutes.).     Marland Kitchen NONFORMULARY OR  COMPOUNDED ITEM Estradiol 0.02% vag cream prefilled applicators  S:Insert appful hs x first 2 weeks of use then insert appful (1 gram) intravaginally twice weekly. 24 each 3  . ondansetron (ZOFRAN ODT) 4 MG disintegrating tablet Take 1 tablet (4 mg total) by mouth every 8 (eight) hours as needed for nausea or vomiting. 20 tablet 0  . Probiotic Product (ALIGN PO) Take 1 capsule by mouth daily.    . sodium chloride (OCEAN) 0.65 % SOLN nasal spray Place 1 spray into both nostrils as needed for congestion.    . traMADol (ULTRAM) 50 MG tablet Take 0.5-1 tablets (25-50 mg total) by mouth every 6 (six) hours as needed for severe pain. 20 tablet 0   No current facility-administered medications for this visit.   Allergies: Macrobid [nitrofurantoin monohyd macro], Meloxicam, Digoxin and related, Mobic [meloxicam], and Sulfamethoxazole-trimethoprim  No LMP recorded. Patient has had a hysterectomy.  Past medical history,surgical history, problem list, medications, allergies, family history and social history were all reviewed and documented as reviewed in the EPIC chart.   OBJECTIVE:  BP 122/80  The patient appears well, alert, oriented x 3, in no distress. PELVIC EXAM: VULVA: Left inner labia with a generally moistened erythematous, desquamated appearance in an area about 1 x 0.5 cm toward the anterior portion of the labia.  Remainder of the surrounding  vulva with a blanched parchment paper appearance consistent with lichen sclerosus but no other defined lesions.  Procedure note Vulvar biopsy The identified left labia lesion was marked with a marker.  Betadine swab x1 was applied to the area.  A fine needle was used to inject 1.5 mL of 1% lidocaine.  Confirming adequate anesthesia, a 4 mm Keyes punch was then inserted into the central portion of the lesion and with the sponginess of the tissue in the area a fairly superficial sample was obtained getting down just into the dermis.  The skin flap was then  grasped with a pickups and transected off its base with an 11 blade scalpel.  The tissue was collected and sent to pathology for analysis.  Application of pressure and silver nitrate cautery resulted in excellent hemostasis.  Chaperone:  [name] present during the examination and procedure  ASSESSMENT:  85 y.o. G2P2001 here for left vulvar biopsy  PLAN:  We discussed that since this defined area the left vulva is persistently irritated, did not respond to topical treatments, and apparently was not related to HSV, that a biopsy is a good idea since LS does increase the risk for vulvar dysplasia and cancer.  Further management will be dictated by the results of the biopsy.  I would suggest just using insulated ice pack today for discomfort control once the anesthetic wears off, then she can use clobetasol topically as needed and OTC pain relievers.  We'll follow up on the results of the biopsy.   Joseph Pierini MD

## 2020-07-28 NOTE — Addendum Note (Signed)
Addended by: Joseph Pierini D on: 07/28/2020 09:53 AM   Modules accepted: Orders

## 2020-08-02 LAB — PATHOLOGY REPORT

## 2020-08-02 LAB — TISSUE SPECIMEN

## 2020-08-11 ENCOUNTER — Ambulatory Visit: Payer: Medicare Other | Admitting: Plastic Surgery

## 2020-08-15 ENCOUNTER — Other Ambulatory Visit: Payer: Self-pay | Admitting: Interventional Cardiology

## 2020-08-15 NOTE — Telephone Encounter (Signed)
Age 85, weight 66kg, SCr 1.09 on 07/21/20 Last OV Oct 2021, afib indication  Pt qualifies for 5mg  BID Eliquis dosing, last note by Dr Tamala Julian states to continue 5mg  BID however pt decreased Eliquis dose to 2.5mg  BID on 11/09/19 due to open wound on her right shin that would not heal and significant amount of bruising in her lower arms after bumping into things. Pt was referred to hematology to determine if XI deficiency and Xa inhibitor use would require dose reduction in anticoag, pt had hx of excessive bleeding and bruising. She was seen by heme/onc 12/02/19 who stated:  Would not recommend full dose anticoagulation due to pt's age and Hx of frequent falls. Could consider continuing prophylactic dose of Eliquis considering mild clinical phenotype, no spontaneous bleeding even with previous therapeutic dose of Eliquis and no significant traumatic bleeding with previous surgeries or clinical birth and only midly lower levels of FXI. -Would not recommend pt discontinue all anticoagulation due to serious risk of stroke due to Afib.   Will refill lower Eliquis dose per heme/onc recommendation

## 2020-08-16 DIAGNOSIS — H25043 Posterior subcapsular polar age-related cataract, bilateral: Secondary | ICD-10-CM | POA: Diagnosis not present

## 2020-08-16 DIAGNOSIS — H18413 Arcus senilis, bilateral: Secondary | ICD-10-CM | POA: Diagnosis not present

## 2020-08-16 DIAGNOSIS — H353131 Nonexudative age-related macular degeneration, bilateral, early dry stage: Secondary | ICD-10-CM | POA: Diagnosis not present

## 2020-08-16 DIAGNOSIS — H2511 Age-related nuclear cataract, right eye: Secondary | ICD-10-CM | POA: Diagnosis not present

## 2020-08-16 DIAGNOSIS — H2513 Age-related nuclear cataract, bilateral: Secondary | ICD-10-CM | POA: Diagnosis not present

## 2020-08-27 ENCOUNTER — Inpatient Hospital Stay (HOSPITAL_COMMUNITY)
Admission: EM | Admit: 2020-08-27 | Discharge: 2020-08-30 | DRG: 371 | Disposition: A | Discharge status: Home / Self Care | Payer: Medicare Other | Attending: Family Medicine | Admitting: Family Medicine

## 2020-08-27 ENCOUNTER — Emergency Department (HOSPITAL_COMMUNITY): Payer: Medicare Other

## 2020-08-27 ENCOUNTER — Encounter (HOSPITAL_COMMUNITY): Payer: Self-pay

## 2020-08-27 ENCOUNTER — Other Ambulatory Visit: Payer: Self-pay

## 2020-08-27 DIAGNOSIS — Z882 Allergy status to sulfonamides status: Secondary | ICD-10-CM | POA: Diagnosis not present

## 2020-08-27 DIAGNOSIS — R109 Unspecified abdominal pain: Secondary | ICD-10-CM | POA: Diagnosis not present

## 2020-08-27 DIAGNOSIS — R935 Abnormal findings on diagnostic imaging of other abdominal regions, including retroperitoneum: Secondary | ICD-10-CM

## 2020-08-27 DIAGNOSIS — Z955 Presence of coronary angioplasty implant and graft: Secondary | ICD-10-CM

## 2020-08-27 DIAGNOSIS — R579 Shock, unspecified: Secondary | ICD-10-CM | POA: Diagnosis not present

## 2020-08-27 DIAGNOSIS — J690 Pneumonitis due to inhalation of food and vomit: Secondary | ICD-10-CM | POA: Diagnosis not present

## 2020-08-27 DIAGNOSIS — A0472 Enterocolitis due to Clostridium difficile, not specified as recurrent: Principal | ICD-10-CM

## 2020-08-27 DIAGNOSIS — I480 Paroxysmal atrial fibrillation: Secondary | ICD-10-CM | POA: Diagnosis not present

## 2020-08-27 DIAGNOSIS — K529 Noninfective gastroenteritis and colitis, unspecified: Secondary | ICD-10-CM

## 2020-08-27 DIAGNOSIS — I129 Hypertensive chronic kidney disease with stage 1 through stage 4 chronic kidney disease, or unspecified chronic kidney disease: Secondary | ICD-10-CM | POA: Diagnosis present

## 2020-08-27 DIAGNOSIS — R194 Change in bowel habit: Secondary | ICD-10-CM | POA: Diagnosis not present

## 2020-08-27 DIAGNOSIS — F32A Depression, unspecified: Secondary | ICD-10-CM | POA: Diagnosis not present

## 2020-08-27 DIAGNOSIS — Z79899 Other long term (current) drug therapy: Secondary | ICD-10-CM

## 2020-08-27 DIAGNOSIS — Z87891 Personal history of nicotine dependence: Secondary | ICD-10-CM

## 2020-08-27 DIAGNOSIS — K219 Gastro-esophageal reflux disease without esophagitis: Secondary | ICD-10-CM | POA: Diagnosis present

## 2020-08-27 DIAGNOSIS — N182 Chronic kidney disease, stage 2 (mild): Secondary | ICD-10-CM | POA: Diagnosis not present

## 2020-08-27 DIAGNOSIS — Z20822 Contact with and (suspected) exposure to covid-19: Secondary | ICD-10-CM | POA: Diagnosis present

## 2020-08-27 DIAGNOSIS — Z8719 Personal history of other diseases of the digestive system: Secondary | ICD-10-CM

## 2020-08-27 DIAGNOSIS — N19 Unspecified kidney failure: Secondary | ICD-10-CM | POA: Diagnosis present

## 2020-08-27 DIAGNOSIS — Z886 Allergy status to analgesic agent status: Secondary | ICD-10-CM | POA: Diagnosis not present

## 2020-08-27 DIAGNOSIS — M419 Scoliosis, unspecified: Secondary | ICD-10-CM | POA: Diagnosis present

## 2020-08-27 DIAGNOSIS — Z7901 Long term (current) use of anticoagulants: Secondary | ICD-10-CM | POA: Diagnosis not present

## 2020-08-27 DIAGNOSIS — K5730 Diverticulosis of large intestine without perforation or abscess without bleeding: Secondary | ICD-10-CM | POA: Diagnosis present

## 2020-08-27 DIAGNOSIS — R197 Diarrhea, unspecified: Secondary | ICD-10-CM | POA: Diagnosis not present

## 2020-08-27 DIAGNOSIS — R918 Other nonspecific abnormal finding of lung field: Secondary | ICD-10-CM

## 2020-08-27 DIAGNOSIS — I10 Essential (primary) hypertension: Secondary | ICD-10-CM | POA: Diagnosis present

## 2020-08-27 DIAGNOSIS — R112 Nausea with vomiting, unspecified: Secondary | ICD-10-CM | POA: Diagnosis not present

## 2020-08-27 DIAGNOSIS — Z888 Allergy status to other drugs, medicaments and biological substances status: Secondary | ICD-10-CM

## 2020-08-27 DIAGNOSIS — I2510 Atherosclerotic heart disease of native coronary artery without angina pectoris: Secondary | ICD-10-CM | POA: Diagnosis present

## 2020-08-27 DIAGNOSIS — J189 Pneumonia, unspecified organism: Secondary | ICD-10-CM | POA: Diagnosis present

## 2020-08-27 DIAGNOSIS — Z743 Need for continuous supervision: Secondary | ICD-10-CM | POA: Diagnosis not present

## 2020-08-27 DIAGNOSIS — R1084 Generalized abdominal pain: Secondary | ICD-10-CM | POA: Diagnosis not present

## 2020-08-27 DIAGNOSIS — A09 Infectious gastroenteritis and colitis, unspecified: Secondary | ICD-10-CM | POA: Diagnosis not present

## 2020-08-27 DIAGNOSIS — R404 Transient alteration of awareness: Secondary | ICD-10-CM | POA: Diagnosis not present

## 2020-08-27 DIAGNOSIS — A049 Bacterial intestinal infection, unspecified: Secondary | ICD-10-CM | POA: Diagnosis not present

## 2020-08-27 DIAGNOSIS — J9601 Acute respiratory failure with hypoxia: Secondary | ICD-10-CM | POA: Diagnosis not present

## 2020-08-27 DIAGNOSIS — E785 Hyperlipidemia, unspecified: Secondary | ICD-10-CM | POA: Diagnosis not present

## 2020-08-27 DIAGNOSIS — E860 Dehydration: Secondary | ICD-10-CM | POA: Diagnosis not present

## 2020-08-27 DIAGNOSIS — R578 Other shock: Secondary | ICD-10-CM | POA: Diagnosis present

## 2020-08-27 DIAGNOSIS — R933 Abnormal findings on diagnostic imaging of other parts of digestive tract: Secondary | ICD-10-CM | POA: Diagnosis not present

## 2020-08-27 DIAGNOSIS — F419 Anxiety disorder, unspecified: Secondary | ICD-10-CM | POA: Diagnosis present

## 2020-08-27 DIAGNOSIS — I4821 Permanent atrial fibrillation: Secondary | ICD-10-CM | POA: Diagnosis present

## 2020-08-27 DIAGNOSIS — I700 Atherosclerosis of aorta: Secondary | ICD-10-CM | POA: Diagnosis not present

## 2020-08-27 DIAGNOSIS — E872 Acidosis, unspecified: Secondary | ICD-10-CM | POA: Diagnosis present

## 2020-08-27 DIAGNOSIS — R1032 Left lower quadrant pain: Secondary | ICD-10-CM | POA: Diagnosis not present

## 2020-08-27 DIAGNOSIS — R6889 Other general symptoms and signs: Secondary | ICD-10-CM | POA: Diagnosis not present

## 2020-08-27 DIAGNOSIS — Z8619 Personal history of other infectious and parasitic diseases: Secondary | ICD-10-CM

## 2020-08-27 DIAGNOSIS — I499 Cardiac arrhythmia, unspecified: Secondary | ICD-10-CM | POA: Diagnosis not present

## 2020-08-27 DIAGNOSIS — J9690 Respiratory failure, unspecified, unspecified whether with hypoxia or hypercapnia: Secondary | ICD-10-CM | POA: Diagnosis not present

## 2020-08-27 DIAGNOSIS — Z833 Family history of diabetes mellitus: Secondary | ICD-10-CM

## 2020-08-27 DIAGNOSIS — R1110 Vomiting, unspecified: Secondary | ICD-10-CM | POA: Diagnosis not present

## 2020-08-27 DIAGNOSIS — J309 Allergic rhinitis, unspecified: Secondary | ICD-10-CM | POA: Diagnosis present

## 2020-08-27 HISTORY — DX: Malignant (primary) neoplasm, unspecified: C80.1

## 2020-08-27 HISTORY — DX: Disorder of kidney and ureter, unspecified: N28.9

## 2020-08-27 LAB — CBC
HCT: 37.6 % (ref 36.0–46.0)
HCT: 32 % — ABNORMAL LOW (ref 36.0–46.0)
Hemoglobin: 11 g/dL — ABNORMAL LOW (ref 12.0–15.0)
Hemoglobin: 10.1 g/dL — ABNORMAL LOW (ref 12.0–15.0)
MCH: 27.9 pg (ref 26.0–34.0)
MCH: 28.5 pg (ref 26.0–34.0)
MCHC: 29.3 g/dL — ABNORMAL LOW (ref 30.0–36.0)
MCHC: 31.6 g/dL (ref 30.0–36.0)
MCV: 95.4 fL (ref 80.0–100.0)
MCV: 90.4 fL (ref 80.0–100.0)
Platelets: 212 10*3/uL (ref 150–400)
Platelets: 220 10*3/uL (ref 150–400)
RBC: 3.94 MIL/uL (ref 3.87–5.11)
RBC: 3.54 MIL/uL — ABNORMAL LOW (ref 3.87–5.11)
RDW: 15.3 % (ref 11.5–15.5)
RDW: 15.3 % (ref 11.5–15.5)
WBC: 5.2 10*3/uL (ref 4.0–10.5)
WBC: 9.3 10*3/uL (ref 4.0–10.5)
nRBC: 0 % (ref 0.0–0.2)
nRBC: 0 % (ref 0.0–0.2)

## 2020-08-27 LAB — COMPREHENSIVE METABOLIC PANEL
ALT: 14 U/L (ref 0–44)
AST: 28 U/L (ref 15–41)
Albumin: 3.2 g/dL — ABNORMAL LOW (ref 3.5–5.0)
Alkaline Phosphatase: 73 U/L (ref 38–126)
Anion gap: 13 (ref 5–15)
BUN: 30 mg/dL — ABNORMAL HIGH (ref 8–23)
CO2: 14 mmol/L — ABNORMAL LOW (ref 22–32)
Calcium: 7.7 mg/dL — ABNORMAL LOW (ref 8.9–10.3)
Chloride: 112 mmol/L — ABNORMAL HIGH (ref 98–111)
Creatinine, Ser: 1.07 mg/dL — ABNORMAL HIGH (ref 0.44–1.00)
GFR, Estimated: 52 mL/min — ABNORMAL LOW (ref >60)
Glucose, Bld: 88 mg/dL (ref 70–99)
Potassium: 3.6 mmol/L (ref 3.5–5.1)
Sodium: 139 mmol/L (ref 135–145)
Total Bilirubin: 0.8 mg/dL (ref 0.3–1.2)
Total Protein: 5.7 g/dL — ABNORMAL LOW (ref 6.5–8.1)

## 2020-08-27 LAB — LACTIC ACID, PLASMA
Lactic Acid, Venous: 4.1 mmol/L (ref 0.5–1.9)
Lactic Acid, Venous: 2.4 mmol/L (ref 0.5–1.9)
Lactic Acid, Venous: 1.8 mmol/L (ref 0.5–1.9)

## 2020-08-27 LAB — MRSA PCR SCREENING: MRSA by PCR: NEGATIVE

## 2020-08-27 LAB — RESP PANEL BY RT-PCR (FLU A&B, COVID) ARPGX2
Influenza A by PCR: NEGATIVE
Influenza B by PCR: NEGATIVE
SARS Coronavirus 2 by RT PCR: NEGATIVE

## 2020-08-27 LAB — C DIFFICILE QUICK SCREEN W PCR REFLEX
C Diff antigen: POSITIVE — AB
C Diff toxin: NEGATIVE

## 2020-08-27 LAB — PROCALCITONIN: Procalcitonin: 5.17 ng/mL

## 2020-08-27 LAB — CORTISOL: Cortisol, Plasma: 37.9 ug/dL

## 2020-08-27 LAB — BRAIN NATRIURETIC PEPTIDE: B Natriuretic Peptide: 320.9 pg/mL — ABNORMAL HIGH (ref 0.0–100.0)

## 2020-08-27 MED ORDER — SODIUM CHLORIDE 0.9 % IV BOLUS
1000.0000 mL | Freq: Once | INTRAVENOUS | Status: AC
  Administered 2020-08-27: 1000 mL via INTRAVENOUS

## 2020-08-27 MED ORDER — PIPERACILLIN-TAZOBACTAM 3.375 G IVPB 30 MIN
3.3750 g | Freq: Once | INTRAVENOUS | Status: AC
  Administered 2020-08-27: 3.375 g via INTRAVENOUS
  Filled 2020-08-27: qty 50

## 2020-08-27 MED ORDER — MORPHINE SULFATE (PF) 4 MG/ML IV SOLN
4.0000 mg | Freq: Once | INTRAVENOUS | Status: AC
  Administered 2020-08-27: 4 mg via INTRAVENOUS
  Filled 2020-08-27: qty 1

## 2020-08-27 MED ORDER — DULOXETINE HCL 30 MG PO CPEP
60.0000 mg | ORAL_CAPSULE | Freq: Every day | ORAL | Status: DC
  Administered 2020-08-27 – 2020-08-30 (×4): 60 mg via ORAL
  Filled 2020-08-27 (×4): qty 2

## 2020-08-27 MED ORDER — ORAL CARE MOUTH RINSE
15.0000 mL | Freq: Two times a day (BID) | OROMUCOSAL | Status: DC
  Administered 2020-08-27 – 2020-08-30 (×6): 15 mL via OROMUCOSAL

## 2020-08-27 MED ORDER — HYOSCYAMINE SULFATE 0.125 MG PO TABS
0.1250 mg | ORAL_TABLET | ORAL | Status: DC | PRN
  Administered 2020-08-28: 0.125 mg via ORAL
  Administered 2020-08-28: 0.25 mg via ORAL
  Administered 2020-08-28 – 2020-08-29 (×2): 0.125 mg via ORAL
  Filled 2020-08-27 (×7): qty 2

## 2020-08-27 MED ORDER — SODIUM CHLORIDE 0.9% FLUSH
3.0000 mL | Freq: Two times a day (BID) | INTRAVENOUS | Status: DC
  Administered 2020-08-27 – 2020-08-30 (×7): 3 mL via INTRAVENOUS

## 2020-08-27 MED ORDER — ONDANSETRON HCL 4 MG/2ML IJ SOLN: 4.0000 mg | Freq: Four times a day (QID) | INTRAMUSCULAR | Status: DC | PRN

## 2020-08-27 MED ORDER — IOHEXOL 300 MG/ML  SOLN
80.0000 mL | Freq: Once | INTRAMUSCULAR | Status: AC | PRN
  Administered 2020-08-27: 80 mL via INTRAVENOUS

## 2020-08-27 MED ORDER — ONDANSETRON HCL 4 MG/2ML IJ SOLN
4.0000 mg | Freq: Once | INTRAMUSCULAR | Status: AC
  Administered 2020-08-27: 4 mg via INTRAVENOUS
  Filled 2020-08-27: qty 2

## 2020-08-27 MED ORDER — LORAZEPAM 1 MG PO TABS
1.0000 mg | ORAL_TABLET | Freq: Two times a day (BID) | ORAL | Status: DC | PRN
  Administered 2020-08-29 (×2): 1 mg via ORAL
  Filled 2020-08-27 (×2): qty 1

## 2020-08-27 MED ORDER — PIPERACILLIN-TAZOBACTAM 3.375 G IVPB
3.3750 g | Freq: Three times a day (TID) | INTRAVENOUS | Status: DC
  Administered 2020-08-27 – 2020-08-29 (×6): 3.375 g via INTRAVENOUS
  Filled 2020-08-27 (×6): qty 50

## 2020-08-27 MED ORDER — ONDANSETRON HCL 4 MG PO TABS: 4.0000 mg | ORAL_TABLET | Freq: Four times a day (QID) | ORAL | Status: DC | PRN

## 2020-08-27 MED ORDER — NITROGLYCERIN 0.4 MG SL SUBL: 0.4000 mg | SUBLINGUAL_TABLET | SUBLINGUAL | Status: DC | PRN

## 2020-08-27 MED ORDER — CHLORHEXIDINE GLUCONATE CLOTH 2 % EX PADS
6.0000 | MEDICATED_PAD | Freq: Every day | CUTANEOUS | Status: DC
  Administered 2020-08-27 – 2020-08-30 (×4): 6 via TOPICAL

## 2020-08-27 MED ORDER — SODIUM CHLORIDE 0.9 % IV BOLUS
500.0000 mL | Freq: Once | INTRAVENOUS | Status: AC
  Administered 2020-08-27: 500 mL via INTRAVENOUS

## 2020-08-27 MED ORDER — PANTOPRAZOLE SODIUM 40 MG PO TBEC
40.0000 mg | DELAYED_RELEASE_TABLET | Freq: Every day | ORAL | Status: DC
  Administered 2020-08-27 – 2020-08-30 (×4): 40 mg via ORAL
  Filled 2020-08-27 (×4): qty 1

## 2020-08-27 MED ORDER — MORPHINE SULFATE (PF) 2 MG/ML IV SOLN
1.0000 mg | INTRAVENOUS | Status: DC | PRN
  Administered 2020-08-28: 1 mg via INTRAVENOUS
  Filled 2020-08-27: qty 1

## 2020-08-27 MED ORDER — LACTATED RINGERS IV SOLN: INTRAVENOUS | Status: AC

## 2020-08-27 MED ORDER — APIXABAN 2.5 MG PO TABS
2.5000 mg | ORAL_TABLET | Freq: Two times a day (BID) | ORAL | Status: DC
  Administered 2020-08-27 – 2020-08-30 (×6): 2.5 mg via ORAL
  Filled 2020-08-27 (×6): qty 1

## 2020-08-27 MED ORDER — METOPROLOL SUCCINATE ER 50 MG PO TB24: 25.0000 mg | ORAL_TABLET | Freq: Every day | ORAL | Status: DC

## 2020-08-27 NOTE — H&P (Signed)
History and Physical        Hospital Admission Note Date: 08/27/2020  Patient name: Hailey [name] Medical record number: [id number] Date of birth: 12-16-1934 Age: 85 y.o. Gender: female  PCP: Plotnikov, Evie Lacks, MD    Chief Complaint    Chief Complaint  Patient presents with  . Abdominal Pain      HPI:   This is an 85 year old female with past medical history of diverticulosis, GERD, CAD s/p DES, paroxysmal atrial fibrillation not on anticoagulation, IBS, depression, s/p appendectomy and hysterectomy who presented to the ED complaining of lower abdominal pain which started last night.  Described as constant, dull and nonradiating.    Husband ate the same food yesterday without any symptoms.  No known history of ulcerative colitis or Crohn's disease but mother died from colon cancer in her 66s and brother died from pancreatic cancer in his 54s.  No recent antibiotic use.  She denies any chest pain or shortness of breath, no back or flank pain.  Has had nausea and nonbloody vomiting but no diarrhea.  No blood in stool.  Denies any fevers, sick contacts.   ED Course: Afebrile, tachycardic, on room air Soft BPs. Notable Labs: Sodium 139, K3.6, chloride 112, CO2 14, glucose 88, BUN 30, creatinine 1.07, WBC 5.2, Hb 11.0, platelets 212. Notable Imaging: CT abdomen pelvis with contrast-compatible with nonspecific infectious or inflammatory colitis, small volume pelvic ascites, moderate sigmoid diverticulosis not suggestive of diverticulitis, patchy groundglass opacity in RML suggesting pneumonia and solid 4 mm RML pulmonary nodule. Patient received Morphine, Zofran, Zosyn, 2 L NS bolus.    Vitals:   08/27/20 1540 08/27/20 1543  BP:  (!) 89/67  Pulse:  (!) 101  Resp:  (!) 22  Temp: (!) 97.5 F (36.4 C)   SpO2:  93%     Review of Systems:  Review of Systems  All other systems  reviewed and are negative.   Medical/Social/Family History   Past Medical History: Past Medical History:  Diagnosis Date  . Acute blood loss anemia 12/25/2017  . Allergic rhinitis   . Anxiety   . Arthritis    "my whole spine" (07/01/2017)  . Atrial fibrillation ([place])   . Bowel obstruction ([place])    in [place]  . Bradycardia with 41-50 beats per minute 12/27/2017  . Cancer ([place])   . Cellulitis of leg, right with large prepatella hematoma and open wounds 12/26/2017  . CKD (chronic kidney disease) stage 2, GFR 60-89 ml/min   . Colon polyps   . Coronary artery disease    10/18 PCI/DES to p/m LCx with cutting balloon to mLcx  . Diverticulosis of colon   . GERD (gastroesophageal reflux disease)   . Hip bursitis 2010   Dr Para March, Post op seroma  . History of colon polyps   . HSV (herpes simplex virus) anogenital infection 07/2019  . HTN (hypertension)   . IBS (irritable bowel syndrome)    constipation predominant - Dr Earlean Shawl  . Lichen sclerosus   . Osteopenia 11/2016   T score -2.0 FRAX 15%/4.3%  . PAC (premature atrial contraction)    Symptomatiic  . Renal insufficiency   . Right bundle branch block (RBBB) on electrocardiogram (  ECG) 12/27/2017  . Scoliosis   . SVT (supraventricular tachycardia) ([place])    brief history    Past Surgical History:  Procedure Laterality Date  . ANTERIOR AND POSTERIOR VAGINAL REPAIR  01/2002   Archie Endo 01/23/2011  . APPENDECTOMY  1948  . CARDIAC CATHETERIZATION  06/26/2017  . CORONARY ANGIOPLASTY WITH STENT PLACEMENT  07/01/2017  . CORONARY ATHERECTOMY N/A 07/01/2017   Procedure: CORONARY ATHERECTOMY;  Surgeon: Belva Crome, MD;  Location: [facility];  Service: Cardiovascular;  Laterality: N/A;  . CORONARY STENT INTERVENTION N/A 07/01/2017   Procedure: CORONARY STENT INTERVENTION;  Surgeon: Belva Crome, MD;  Location: [facility];  Service: Cardiovascular;  Laterality: N/A;  . HAMMER TOE SURGERY    . HEMORRHOID BANDING    . HIP  SURGERY Left 04/2009   hip examination under anesthesia followed by greater trochanteric bursectomy; iliotibial band tenotomy/notes 01/20/2011  . I & D EXTREMITY Right 01/10/2018   Procedure: IRRIGATION AND DEBRIDEMENT RIGHT KNEE, APPLY WOUND VAC;  Surgeon: Newt Minion, MD;  Location: [facility];  Service: Orthopedics;  Laterality: Right;  . KNEE BURSECTOMY Right 04/2009   Archie Endo 01/09/2011  . LEFT HEART CATH AND CORONARY ANGIOGRAPHY N/A 06/26/2017   Procedure: LEFT HEART CATH AND CORONARY ANGIOGRAPHY;  Surgeon: Belva Crome, MD;  Location: [facility];  Service: Cardiovascular;  Laterality: N/A;  . PUBOVAGINAL SLING  01/2002   Archie Endo 01/23/2011  . REDUCTION MAMMAPLASTY    . TEMPORARY PACEMAKER N/A 07/01/2017   Procedure: TEMPORARY PACEMAKER;  Surgeon: Belva Crome, MD;  Location: [facility];  Service: Cardiovascular;  Laterality: N/A;  . VAGINAL HYSTERECTOMY  01/2002   Vaginal hysterectomy, bilateral salpingo-oophorectomy/notes 01/23/2011    Medications: Prior to Admission medications   Medication Sig Start Date End Date Taking? Authorizing Provider  acetaminophen (TYLENOL) 500 MG tablet Take 1,000 mg by mouth every 6 (six) hours as needed (Coated or capsules).    [provider]  atorvastatin (LIPITOR) 80 MG tablet TAKE 1 TABLET DAILY AT 6PM. Patient taking differently: Take 80 mg by mouth daily. 05/05/20   Belva Crome, MD  betamethasone valerate ointment (VALISONE) 0.1 % Apply 1 application topically 2 (two) times daily. Apply twice a day for 4 weeks, once a day for 4 weeks, then twice weekly Patient taking differently: Apply 1 application topically See admin instructions. Apply twice a day for 4 weeks, once a day for 4 weeks, then twice weekly 05/30/20   Marny Lowenstein A, NP  cetirizine (ZYRTEC) 10 MG tablet Take 10 mg by mouth daily.    [provider]  DULoxetine (CYMBALTA) 60 MG capsule TAKE 1 CAPSULE DAILY. Patient taking differently: Take 60 mg by  mouth daily. 06/15/20   Plotnikov, Evie Lacks, MD  ELIQUIS 2.5 MG TABS tablet TAKE 1 TABLET BY MOUTH TWICE DAILY. Patient taking differently: Take 2.5 mg by mouth 2 (two) times daily. 08/15/20   Belva Crome, MD  esomeprazole (NEXIUM) 40 MG capsule TAKE (1) CAPSULE DAILY. Patient taking differently: Take 40 mg by mouth daily. 07/19/20   Plotnikov, Evie Lacks, MD  hyoscyamine (LEVSIN) 0.125 MG tablet TAKE 1-2 TABLETS (0.125-0.25 MG) BY MOUTH EVERY 4 HOURS AS NEEDED FOR UP TO 10 DAYS FOR CRAMPING. Patient taking differently: Take 0.125-0.25 mg by mouth every 4 (four) hours as needed for cramping. 03/27/20   Plotnikov, Evie Lacks, MD  LORazepam (ATIVAN) 1 MG tablet TAKE 1 TABLET TWICE DAILY AS NEEDED FOR ANXIETY/SLEEP. Patient taking  differently: Take 1 mg by mouth 2 (two) times daily as needed for anxiety or sleep. 07/20/20   Plotnikov, Evie Lacks, MD  losartan (COZAAR) 25 MG tablet Take 1 tablet (25 mg total) by mouth daily. 07/07/20   Belva Crome, MD  metoprolol succinate (TOPROL XL) 25 MG 24 hr tablet Take 1 tablet (25 mg total) by mouth daily. 03/07/20   Tommie Raymond, NP  nitroGLYCERIN (NITROSTAT) 0.4 MG SL tablet Place 0.4 mg under the tongue every 5 (five) minutes as needed for chest pain (Call 911 at 3rd dose within 15 minutes.).     [provider]  NONFORMULARY OR COMPOUNDED ITEM Estradiol 0.02% vag cream prefilled applicators  S:Insert appful hs x first 2 weeks of use then insert appful (1 gram) intravaginally twice weekly. 02/23/20   Joseph Pierini, MD  ondansetron (ZOFRAN ODT) 4 MG disintegrating tablet Take 1 tablet (4 mg total) by mouth every 8 (eight) hours as needed for nausea or vomiting. 03/10/20   Plotnikov, Evie Lacks, MD  Probiotic Product (ALIGN PO) Take 1 capsule by mouth daily.    [provider]  sodium chloride (OCEAN) 0.65 % SOLN nasal spray Place 1 spray into both nostrils as needed for congestion.    [provider]  traMADol (ULTRAM) 50 MG tablet  Take 0.5-1 tablets (25-50 mg total) by mouth every 6 (six) hours as needed for severe pain. 03/15/20   Plotnikov, Evie Lacks, MD    Allergies:   Allergies  Allergen Reactions  . Macrobid [employer] Macro] Other (See Comments)    Nausea, stomach cramps, fatigue , headache.  . Meloxicam Other (See Comments)    Jittery and headache  . Digoxin And Related     headaches  . Mobic [Meloxicam]   . Sulfamethoxazole-Trimethoprim Nausea Only    Social History:  reports that she quit smoking about 40 years ago. Her smoking use included cigarettes. She has a 7.00 pack-year smoking history. She has never used smokeless tobacco. She reports current alcohol use of about 7.0 standard drinks of alcohol per week. She reports that she does not use drugs.  Family History: Family History  Problem Relation Age of Onset  . Colon cancer Mother   . Diabetes Father   . Prostate cancer Father   . Prostate cancer Brother   . Pancreatic cancer Brother   . Stomach cancer Son   . Heart attack Neg Hx   . Stroke Neg Hx   . Esophageal cancer Neg Hx      Objective   Physical Exam: Blood pressure (!) 89/67, pulse (!) 101, temperature (!) 97.5 F (36.4 C), temperature source Oral, resp. rate (!) 22, SpO2 93 %.  Physical Exam Vitals and nursing note reviewed. Exam conducted with a chaperone present.  Constitutional:      Appearance: She is ill-appearing.  HENT:     Head: Normocephalic.  Cardiovascular:     Rate and Rhythm: Tachycardia present. Rhythm irregular.     Heart sounds: No murmur heard.   Pulmonary:     Effort: Pulmonary effort is normal.  Abdominal:     General: Abdomen is flat.     Tenderness: There is abdominal tenderness in the left lower quadrant.     Comments: Very tender LLQ Patient got up from bed during the exam very quickly to have a BM in the commode  Skin:    General: Skin is warm.     Coloration: Skin is not cyanotic.  Neurological:  General: No focal  deficit present.     Mental Status: She is alert.  Psychiatric:        Mood and Affect: Mood normal.        Behavior: Behavior normal.     LABS on Admission: I have personally reviewed all the labs and imaging below    Basic Metabolic Panel: Recent Labs  Lab 08/27/20 0738  NA 139  K 3.6  CL 112*  CO2 14*  GLUCOSE 88  BUN 30*  CREATININE 1.07*  CALCIUM 7.7*   Liver Function Tests: Recent Labs  Lab 08/27/20 0738  AST 28  ALT 14  ALKPHOS 73  BILITOT 0.8  PROT 5.7*  ALBUMIN 3.2*   No results for input(s): LIPASE, AMYLASE in the last 168 hours. No results for input(s): AMMONIA in the last 168 hours. CBC: Recent Labs  Lab 08/27/20 0738  WBC 5.2  HGB 11.0*  HCT 37.6  MCV 95.4  PLT 212   Cardiac Enzymes: No results for input(s): CKTOTAL, CKMB, CKMBINDEX, TROPONINI in the last 168 hours. BNP: Invalid input(s): POCBNP CBG: No results for input(s): GLUCAP in the last 168 hours.  Radiological Exams on Admission:  CT Abdomen Pelvis W Contrast  Result Date: 08/27/2020 CLINICAL DATA:  Mid to lower abdominal pain and vomiting since last night. Prior appendectomy and hysterectomy. EXAM: CT ABDOMEN AND PELVIS WITH CONTRAST TECHNIQUE: Multidetector CT imaging of the abdomen and pelvis was performed using the standard protocol following bolus administration of intravenous contrast. CONTRAST:  8mL OMNIPAQUE IOHEXOL 300 MG/ML  SOLN COMPARISON:  02/24/2020 CT abdomen/pelvis. FINDINGS: Lower chest: Patchy consolidation and ground-glass opacity in the right middle lobe is new. Solid 4 mm right middle lobe pulmonary nodule (series 4/image 3). Coronary atherosclerosis. Hepatobiliary: Normal liver size. Two scattered subcentimeter hypodense right liver lesions are too small to characterize and not appreciably changed, considered benign. No new liver lesions. Normal gallbladder with no radiopaque cholelithiasis. No biliary ductal dilatation. Pancreas: Normal, with no mass or duct  dilation. Spleen: Normal size. No mass. Adrenals/Urinary Tract: Normal adrenals. Simple left renal cysts, largest 2.4 cm in the upper left kidney. Additional scattered subcentimeter hypodense left renal cortical lesions are too small to characterize and are unchanged, considered benign. No suspicious renal masses. No hydronephrosis. Normal bladder. Stomach/Bowel: Normal non-distended stomach. Normal caliber small bowel with no small bowel wall thickening. Appendectomy. New prominent wall thickening throughout the splenic flexure of the colon, descending colon and proximal sigmoid colon with associated pericolonic fat stranding and ill-defined fluid. Moderate sigmoid diverticulosis. Fluid-filled large bowel. Vascular/Lymphatic: Atherosclerotic nonaneurysmal abdominal aorta. Patent portal, splenic, hepatic and renal veins. No pathologically enlarged lymph nodes in the abdomen or pelvis. Reproductive: Status post hysterectomy, with no abnormal findings at the vaginal cuff. No adnexal mass. Other: No pneumoperitoneum. Small volume pelvic ascites. No focal fluid collection. Musculoskeletal: No aggressive appearing focal osseous lesions. Marked thoracolumbar spondylosis. Chronic mild T10 vertebral compression fracture. IMPRESSION: 1. New prominent wall thickening throughout the splenic flexure of the colon, descending colon and proximal sigmoid colon with associated pericolonic fat stranding and ill-defined fluid, most compatible with nonspecific infectious or inflammatory colitis, with the differential including C diff colitis. Fluid-filled large bowel loops indicating malabsorption. No free air. No abscess. No bowel obstruction. 2. Small volume pelvic ascites. 3. Moderate sigmoid diverticulosis. Findings are not suggestive of acute diverticulitis. 4. Patchy consolidation and ground-glass opacity in the right middle lobe, new, suggesting pneumonia/aspiration. 5. Solid 4 mm right middle lobe pulmonary nodule. No follow-up  needed if patient is low-risk. Non-contrast chest CT can be considered in 12 months if patient is high-risk. This recommendation follows the consensus statement: Guidelines for Management of Incidental Pulmonary Nodules Detected on CT Images: From the Fleischner Society 2017; Radiology 2017; 284:228-243. 6. Aortic Atherosclerosis (ICD10-I70.0). Electronically Signed   By: Ilona Sorrel M.D.   On: 08/27/2020 11:07      EKG: Not done   A & P   Principal Problem:   Colitis Active Problems:   Essential hypertension   PAF (paroxysmal atrial fibrillation) (H[facility])   Depression   Aortic atherosclerosis ([place])   1. Colitis of unknown etiology a. Afebrile and hemodynamically stable on room air b. No leukocytosis c. Lactic acid 4.1 -trend d. Continue Zosyn for now e. Stool studies  f. Continue IV fluids g. Bowel rest except sips with meds h. GI consulted, discussed with Dr. Collene Mares  2. A. fib with RVR a. Did not take her medications as of yet today b. HR 90s-130s at bedside c. Holding home Toprol with soft Bps d. Continue eliquis e. Continue telemetry  3. RML groundglass opacity a. Noted on CT b. Unlikely pneumonia given afebrile and no leukocytosis and no shortness of breath c. Continue to monitor  4. Hypertension, currently with Hypotension a. IV fluid bolus b. Hold toprol  5. Hyperlipidemia a. Continue statin  6. Depression a. Continue Cymbalta   DVT prophylaxis: Eliquis   Code Status: Full Code  Diet: N.p.o. except for sips with meds Family Communication: Admission, patients condition and plan of care including tests being ordered have been discussed with the patient who indicates understanding and agrees with the plan and Code Status. Patient's husband was updated  Disposition Plan: The appropriate patient status for this patient is INPATIENT. Inpatient status is judged to be reasonable and necessary in order to provide the required intensity of service to ensure the  patient's safety. The patient's presenting symptoms, physical exam findings, and initial radiographic and laboratory data in the context of their chronic comorbidities is felt to place them at high risk for further clinical deterioration. Furthermore, it is not anticipated that the patient will be medically stable for discharge from the hospital within 2 midnights of admission. The following factors support the patient status of inpatient.   " The patient's presenting symptoms include abdominal pain. " The worrisome physical exam findings include significant LLQ abdominal pain and appears ill. " The initial radiographic and laboratory data are worrisome because of elevated lactic acid. " The chronic co-morbidities include atrial fibrillation.   * I certify that at the point of admission it is my clinical judgment that the patient will require inpatient hospital care spanning beyond 2 midnights from the point of admission due to high intensity of service, high risk for further deterioration and high frequency of surveillance required.*   Status is: Inpatient  Remains inpatient appropriate because:IV treatments appropriate due to intensity of illness or inability to take PO and Inpatient level of care appropriate due to severity of illness   Dispo: The patient is from: Home              Anticipated d/c is to: Home              Anticipated d/c date is: > 3 days              Patient currently is not medically stable to d/c.         The medical decision making on this patient was  of high complexity and the patient is at high risk for clinical deterioration, therefore this is a level 3 admission.  Consultants  . GI  Procedures  . None  Time Spent on Admission: 70 minutes    Harold Hedge, DO [facility]  08/27/2020, 3:56 PM

## 2020-08-27 NOTE — ED Notes (Signed)
Patient only had a bowel movement Two hats placed on toilet/ no urine

## 2020-08-27 NOTE — ED Notes (Signed)
Dr [name] at bedside speaking with patient.

## 2020-08-27 NOTE — ED Triage Notes (Signed)
Transported by [facility] from home-- onset of sudden cramping abdominal pain that started last night at 9 pm. Hx of diverticulitis. +dizziness. Hx of A fib. + n/v. EMS administered 200 cc of NS prior to arrival.

## 2020-08-27 NOTE — ED Notes (Signed)
Dr. [name] made aware of pt's BP of 89/67.  Dr. [name] ordered a bolus to be given when pt went upstairs. Pt remained in stable condition to transfer upstairs.

## 2020-08-27 NOTE — ED Notes (Signed)
Patient incontinent of stool; bed/gown/ and linens changed at this time. Patient placed in a brief and family remains with patient at bedside.

## 2020-08-27 NOTE — ED Notes (Signed)
Only able to obtain 1 blood culture at this time due to difficultly obtaining additional IV access.

## 2020-08-27 NOTE — Consult Note (Signed)
NAME:  Hailey [name], MRN:  [id number], DOB:  [birth year], LOS: 0 ADMISSION DATE:  08/27/2020, CONSULTATION DATE: 12/18 REFERRING MD:  Nickolas Madrid  CHIEF COMPLAINT:  Low bp    Brief History:  37 yowf with h/o hbp/ PAF on doac admitted with 24 h abd pain with working dx  By ct ? colitis and dropped BP   PM 12/18  p one episode of emesis and one of diarrhea but not voluminous  and with sluggish response to vol challenge so  PCCM consulted and pt transferred to SDU.   History of Present Illness:  70  yowf  with past medical history of diverticulosis, GERD, CAD s/p DES, paroxysmal atrial fibrillation not on anticoagulation, IBS, depression, s/p appendectomy and hysterectomy who presented to the ED complaining of lower abdominal pain which started last night.  Described as constant, dull and nonradiating.    Husband ate the same food without any symptoms.  No known history of ulcerative colitis or Crohn's disease but mother died from colon cancer in her 40s and brother died from pancreatic cancer in his 35s.  No recent antibiotic use.  She denied any chest pain or shortness of breath, no back or flank pain.  Has had nausea and nonbloody vomiting but no diarrhea.  No blood in stool.  Denies any fevers, sick contacts.   ED Course: Afebrile, tachycardic, on room air Soft BPs. Notable Labs: Sodium 139, K3.6, chloride 112, CO2 14, glucose 88, BUN 30, creatinine 1.07, WBC 5.2, Hb 11.0, platelets 212. Notable Imaging: CT abdomen pelvis with contrast-compatible with nonspecific infectious or inflammatory colitis, small volume pelvic ascites, moderate sigmoid diverticulosis not suggestive of diverticulitis, patchy groundglass opacity in RML suggesting pneumonia and solid 4 mm RML pulmonary nodule. Patient received Morphine, Zofran, Zosyn, 2 L NS bolus.   On my eval 6 pm 12/18 pt a bit more comfortable p about 1.5 liters fluid. Last ate nl meal pm 12/17, last bp meds same am but none since. Only c/o = diffuse  non crampy abd pain s N or V or D in pm 12/18 .  Denies cp/sob.  Past Medical History:  HBP IHD / PAF GERD/IBS Diverticulosis    Significant Hospital Events:  Transferred to SDU pm 12/18   Consults:  GI 12/18  PCCM 12/18  Procedures:    Significant Diagnostic Tests:   CT abd 12/18 1. New prominent wall thickening throughout the splenic flexure of the colon, descending colon and proximal sigmoid colon with associated pericolonic fat stranding and ill-defined fluid, most compatible with nonspecific infectious or inflammatory colitis, with the differential including C diff colitis. Fluid-filled large bowel loops indicating malabsorption. No free air. No abscess. No bowel obstruction. 2. Small volume pelvic ascites. 3. Moderate sigmoid diverticulosis. Findings are not suggestive of acute diverticulitis. 4. Patchy consolidation and ground-glass opacity in the right middle lobe, new, suggesting pneumonia/aspiration.  Micro Data:  RESP viral panel PRC neg covid 19/ flu  BC x 2  12/18 >>> Stool for c diff pcr >>>    Antimicrobials:  Zosyn 12/18    Scheduled Meds:  apixaban  2.5 mg Oral BID   Chlorhexidine Gluconate Cloth  6 each Topical Daily   DULoxetine  60 mg Oral Daily   mouth rinse  15 mL Mouth Rinse BID   pantoprazole  40 mg Oral Daily   sodium chloride flush  3 mL Intravenous Q12H   Continuous Infusions:  lactated ringers 100 mL/hr at 08/27/20 1630   piperacillin-tazobactam (ZOSYN)  IV     PRN Meds:.hyoscyamine, LORazepam, morphine injection, nitroGLYCERIN, ondansetron **OR** ondansetron (ZOFRAN) IV    Interim History / Subjective:   as above, still on floor at time of initial eval  Objective   Blood pressure 100/69, pulse (!) 102, temperature 98.9 F (37.2 C), resp. rate 16, height 5\' 3"  (1.6 m), weight 66.7 kg, SpO2 95 %.        Intake/Output Summary (Last 24 hours) at 08/27/2020 1900 Last data filed at 08/27/2020 1343 Gross per 24 hour   Intake --  Output 1 ml  Net -1 ml   Filed Weights   08/27/20 1854  Weight: 66.7 kg    Examination: General:  02 sats 97% on 2lpm  Tmax: 99.6 HENT: orophx clear, mucosa dry Lungs: clear to A and P bilaterally Cardiovascular: slt irreg Pulse around 90 Abdomen: diffuse mild tenderness with ? Rebound decreased BS Extremities: warm to touch . No calf tenderness or edema Neuro: alert/ sensorium intact     I personally reviewed images and agree with radiology impression as follows:  CXR:   Portable 12/18   still pending     Resolved Hospital Problem list      Assessment & Plan:  1) circulatory shock with med acidosis in setting of ? GI sepsis from colitis viral or c diff or o/w (though no risk factors for either) >>> rx is supportive with plenty of IV fluids anticipating 3rd spacing over next 24 h regardless of cause of the abd pain >>> GI w/u in progress / rx with zosyn ok unless this is c diff  >>> check bnp/ PCT/ recheck cbc p fluid challenge    2) HBP with RAF  bp meds from 12/17 am probably still on board / obviously holding them now but may required even more fluid than usual to compensate for response to hypovolemia blunted by meds still on board.   >> may need cvl monitoring so go ahead with [facility] if IV access or vol status an issue >> neo best pressor in view of tendency to RVR    3) Pre renal azotemia/Metabolic acidosis s AG c/w diarrhea bicarb loss though the amt described so far is not voluminous  >> rx Lactated ringers should suffice for now as she's not having any increased wob to offset the acidosis at this point and doesn't need any help compensating.      Best practice (evaluated daily)  Diet: npo Pain/Anxiety/Delirium protocol (if indicated): per triad  VAP protocol (if indicated): n/a DVT prophylaxis: per triad  GI prophylaxis: per triad Glucose control: n/a Mobility: SBR Disposition: Step down      Labs   CBC: Recent Labs  Lab 08/27/20 0738   WBC 5.2  HGB 11.0*  HCT 37.6  MCV 95.4  PLT 734    Basic Metabolic Panel: Recent Labs  Lab 08/27/20 0738  NA 139  K 3.6  CL 112*  CO2 14*  GLUCOSE 88  BUN 30*  CREATININE 1.07*  CALCIUM 7.7*   GFR: Estimated Creatinine Clearance: 36.5 mL/min (A) (by C-G formula based on SCr of 1.07 mg/dL (H)). Recent Labs  Lab 08/27/20 0738 08/27/20 1136 08/27/20 1640  WBC 5.2  --   --   LATICACIDVEN  --  4.1* 2.4*    Liver Function Tests: Recent Labs  Lab 08/27/20 0738  AST 28  ALT 14  ALKPHOS 73  BILITOT 0.8  PROT 5.7*  ALBUMIN 3.2*   No results for input(s): LIPASE, AMYLASE in  the last 168 hours. No results for input(s): AMMONIA in the last 168 hours.  ABG    Component Value Date/Time   TCO2 25 09/01/2013 1322     Coagulation Profile: No results for input(s): INR, PROTIME in the last 168 hours.  Cardiac Enzymes: No results for input(s): CKTOTAL, CKMB, CKMBINDEX, TROPONINI in the last 168 hours.  HbA1C: Hgb A1c MFr Bld  Date/Time Value Ref Range Status  07/04/2014 05:00 AM 5.6 <5.7 % Final    Comment:    (NOTE)                                                                       According to the ADA Clinical Practice Recommendations for 2011, when HbA1c is used as a screening test:  >=6.5%   Diagnostic of Diabetes Mellitus           (if abnormal result is confirmed) 5.7-6.4%   Increased risk of developing Diabetes Mellitus References:Diagnosis and Classification of Diabetes Mellitus,Diabetes ZOXW,9604,54(UJWJX 1):S62-S69 and Standards of Medical Care in         Diabetes - 2011,Diabetes BJYN,8295,62 (Suppl 1):S11-S61.    CBG: No results for input(s): GLUCAP in the last 168 hours.     Past Medical History:  She,  has a past medical history of Acute blood loss anemia (12/25/2017), Allergic rhinitis, Anxiety, Arthritis, Atrial fibrillation ([place]), Bowel obstruction (HCC), Bradycardia with 41-50 beats per minute (12/27/2017), Cancer ([place]), Cellulitis of  leg, right with large prepatella hematoma and open wounds (12/26/2017), CKD (chronic kidney disease) stage 2, GFR 60-89 ml/min, Colon polyps, Coronary artery disease, Diverticulosis of colon, GERD (gastroesophageal reflux disease), Hip bursitis (2010), History of colon polyps, HSV (herpes simplex virus) anogenital infection (07/2019), HTN (hypertension), IBS (irritable bowel syndrome), Lichen sclerosus, Osteopenia (11/2016), PAC (premature atrial contraction), Renal insufficiency, Right bundle branch block (RBBB) on electrocardiogram (ECG) (12/27/2017), Scoliosis, and SVT (supraventricular tachycardia) ([place]).   Surgical History:   Past Surgical History:  Procedure Laterality Date   ANTERIOR AND POSTERIOR VAGINAL REPAIR  01/2002   Archie Endo 01/23/2011   APPENDECTOMY  1948   CARDIAC CATHETERIZATION  06/26/2017   CORONARY ANGIOPLASTY WITH STENT PLACEMENT  07/01/2017   CORONARY ATHERECTOMY N/A 07/01/2017   Procedure: CORONARY ATHERECTOMY;  Surgeon: Belva Crome, MD;  Location: [facility];  Service: Cardiovascular;  Laterality: N/A;   CORONARY STENT INTERVENTION N/A 07/01/2017   Procedure: CORONARY STENT INTERVENTION;  Surgeon: Belva Crome, MD;  Location: [facility];  Service: Cardiovascular;  Laterality: N/A;   HAMMER TOE SURGERY     HEMORRHOID BANDING     HIP SURGERY Left 04/2009   hip examination under anesthesia followed by greater trochanteric bursectomy; iliotibial band tenotomy/notes 01/20/2011   I & D EXTREMITY Right 01/10/2018   Procedure: IRRIGATION AND DEBRIDEMENT RIGHT KNEE, APPLY WOUND VAC;  Surgeon: Newt Minion, MD;  Location: [facility];  Service: Orthopedics;  Laterality: Right;   KNEE BURSECTOMY Right 04/2009   Archie Endo 01/09/2011   LEFT HEART CATH AND CORONARY ANGIOGRAPHY N/A 06/26/2017   Procedure: LEFT HEART CATH AND CORONARY ANGIOGRAPHY;  Surgeon: Belva Crome, MD;  Location: [facility];  Service: Cardiovascular;  Laterality: N/A;   PUBOVAGINAL  SLING  01/2002   /  notes 01/23/2011   REDUCTION MAMMAPLASTY     TEMPORARY PACEMAKER N/A 07/01/2017   Procedure: TEMPORARY PACEMAKER;  Surgeon: Belva Crome, MD;  Location: [facility];  Service: Cardiovascular;  Laterality: N/A;   VAGINAL HYSTERECTOMY  01/2002   Vaginal hysterectomy, bilateral salpingo-oophorectomy/notes 01/23/2011     Social History:   reports that she quit smoking about 40 years ago. Her smoking use included cigarettes. She has a 7.00 pack-year smoking history. She has never used smokeless tobacco. She reports current alcohol use of about 7.0 standard drinks of alcohol per week. She reports that she does not use drugs.   Family History:  Her family history includes Colon cancer in her mother; Diabetes in her father; Pancreatic cancer in her brother; Prostate cancer in her brother and father; Stomach cancer in her son. There is no history of Heart attack, Stroke, or Esophageal cancer.   Allergies Allergies  Allergen Reactions   Macrobid [Nitrofurantoin Monohyd Macro] Other (See Comments)    Nausea, stomach cramps, fatigue , headache.   Meloxicam Other (See Comments)    Jittery and headache   Digoxin And Related     headaches   Mobic [Meloxicam]    Sulfamethoxazole-Trimethoprim Nausea Only     Home Medications  Prior to Admission medications   Medication Sig Start Date End Date Taking? Authorizing Provider  acetaminophen (TYLENOL) 500 MG tablet Take 1,000 mg by mouth every 6 (six) hours as needed (body aches / sore hip).   Yes [provider]  atorvastatin (LIPITOR) 80 MG tablet TAKE 1 TABLET DAILY AT 6PM. Patient taking differently: Take 80 mg by mouth daily. 05/05/20  Yes Belva Crome, MD  cetirizine (ZYRTEC) 10 MG tablet Take 10 mg by mouth daily as needed for allergies.   Yes [provider]  DULoxetine (CYMBALTA) 60 MG capsule TAKE 1 CAPSULE DAILY. Patient taking differently: Take 60 mg by mouth daily. 06/15/20  Yes Plotnikov,  Evie Lacks, MD  ELIQUIS 2.5 MG TABS tablet TAKE 1 TABLET BY MOUTH TWICE DAILY. Patient taking differently: Take 2.5 mg by mouth 2 (two) times daily. 08/15/20  Yes Belva Crome, MD  esomeprazole (NEXIUM) 40 MG capsule TAKE (1) CAPSULE DAILY. Patient taking differently: Take 40 mg by mouth daily. 07/19/20  Yes Plotnikov, Evie Lacks, MD  hyoscyamine (LEVSIN) 0.125 MG tablet TAKE 1-2 TABLETS (0.125-0.25 MG) BY MOUTH EVERY 4 HOURS AS NEEDED FOR UP TO 10 DAYS FOR CRAMPING. Patient taking differently: Take 0.125-0.25 mg by mouth every 4 (four) hours as needed for cramping. 03/27/20  Yes Plotnikov, Evie Lacks, MD  LORazepam (ATIVAN) 1 MG tablet TAKE 1 TABLET TWICE DAILY AS NEEDED FOR ANXIETY/SLEEP. Patient taking differently: Take 1 mg by mouth 2 (two) times daily as needed for anxiety or sleep. 07/20/20  Yes Plotnikov, Evie Lacks, MD  losartan (COZAAR) 25 MG tablet Take 1 tablet (25 mg total) by mouth daily. 07/07/20  Yes Belva Crome, MD  metoprolol succinate (TOPROL XL) 25 MG 24 hr tablet Take 1 tablet (25 mg total) by mouth daily. 03/07/20  Yes Kathyrn Drown D, NP  nitroGLYCERIN (NITROSTAT) 0.4 MG SL tablet Place 0.4 mg under the tongue every 5 (five) minutes as needed for chest pain (Call 911 at 3rd dose within 15 minutes.).    Yes [provider]  ondansetron (ZOFRAN ODT) 4 MG disintegrating tablet Take 1 tablet (4 mg total) by mouth every 8 (eight) hours as needed for nausea or vomiting. 03/10/20  Yes Plotnikov, Evie Lacks,  MD  Probiotic Product (ALIGN PO) Take 1 capsule by mouth daily.   Yes [provider]  sodium chloride (OCEAN) 0.65 % SOLN nasal spray Place 1 spray into both nostrils as needed for congestion.   Yes [provider]  traMADol (ULTRAM) 50 MG tablet Take 0.5-1 tablets (25-50 mg total) by mouth every 6 (six) hours as needed for severe pain. 03/15/20  Yes Plotnikov, Evie Lacks, MD     The patient is critically ill with multiple organ systems failure and requires high  complexity decision making for assessment and support, frequent evaluation and titration of therapies, application of advanced monitoring technologies and extensive interpretation of multiple databases. Critical Care Time devoted to patient care services described in this note is 45 minutes.    Christinia Gully, MD [facility] [place] [phone]   After 7:00 pm call Elink  [phone]

## 2020-08-27 NOTE — ED Provider Notes (Signed)
[facility] DEPT Provider Note   CSN: [id number] Arrival date & time: 08/27/20  3474     History Chief Complaint  Patient presents with  . Abdominal Pain    Hailey [name] is a 85 y.o. female.  Patient c/o bilateral lower abd pain. Symptoms acute onset last pm, constant, dull, moderate, non radiating. Denies hx same pain. No radiation of pain. No back/flank pain. Last bm yesterday. +nausea and vomiting. Emesis not bloody or bilious. No abd distension. Remote hx appendectomy and hysterectomy. No dysuria or gu c/o. +hx diverticula/itis.  No fever or chills.   The history is provided by the patient.  Abdominal Pain Associated symptoms: nausea and vomiting   Associated symptoms: no chest pain, no chills, no dysuria, no fever, no shortness of breath and no sore throat        Past Medical History:  Diagnosis Date  . Acute blood loss anemia 12/25/2017  . Allergic rhinitis   . Anxiety   . Arthritis    "my whole spine" (07/01/2017)  . Atrial fibrillation ([place])   . Bowel obstruction ([place])    in [place]  . Bradycardia with 41-50 beats per minute 12/27/2017  . Cellulitis of leg, right with large prepatella hematoma and open wounds 12/26/2017  . CKD (chronic kidney disease) stage 2, GFR 60-89 ml/min   . Colon polyps   . Coronary artery disease    10/18 PCI/DES to p/m LCx with cutting balloon to mLcx  . Diverticulosis of colon   . GERD (gastroesophageal reflux disease)   . Hip bursitis 2010   Dr Para March, Post op seroma  . History of colon polyps   . HSV (herpes simplex virus) anogenital infection 07/2019  . HTN (hypertension)   . IBS (irritable bowel syndrome)    constipation predominant - Dr Earlean Shawl  . Lichen sclerosus   . Osteopenia 11/2016   T score -2.0 FRAX 15%/4.3%  . PAC (premature atrial contraction)    Symptomatiic  . Right bundle branch block (RBBB) on electrocardiogram (ECG) 12/27/2017  . Scoliosis   . SVT (supraventricular tachycardia)  ([place])    brief history    Patient Active Problem List   Diagnosis Date Noted  . Squamous cell cancer of skin of left cheek 07/04/2020  . Lichen sclerosus 25/95/6387  . Abdominal cramping 03/16/2020  . Neurogenic orthostatic hypotension ([place]) 09/16/2019  . Injury of face 08/31/2019  . Leg abrasion, infected, right, subsequent encounter 08/28/2019  . Face lacerations 08/26/2019  . Nose fracture 08/26/2019  . Concussion 08/26/2019  . Wrist pain, acute, right 08/26/2019  . Greater trochanteric pain syndrome of right lower extremity 07/30/2019  . Viral URI with cough 08/15/2018  . Abdominal pain 05/22/2018  . Traumatic hematoma of right knee 01/10/2018  . Ulcer of right knee ([place])   . Bradycardia with 41-50 beats per minute 12/27/2017  . Right bundle branch block (RBBB) on electrocardiogram (ECG) 12/27/2017  . Quadriceps tendon rupture 12/24/2017  . CKD (chronic kidney disease), stage II 12/24/2017  . Cellulitis of leg, right with large prepatella hematoma and open wounds 12/24/2017  . Iliotibial band syndrome of right side 12/16/2017  . Depression 07/11/2017  . Intervertebral lumbar disc disorder with myelopathy, lumbar region 04/25/2017  . Family history of colon cancer in mother 01/22/2017  . Primary osteoarthritis of both first carpometacarpal joints 01/02/2017  . Pain 11/21/2016  . Gastroenteritis 09/13/2016  . Chronic anticoagulation 05/24/2015  . Coronary artery disease involving native coronary artery of native heart  with angina pectoris ([place]) 12/20/2014  . Iliotibial band syndrome of left side 11/16/2014  . Diverticulitis of colon 08/30/2014  . Chest pain, atypical 07/03/2014  . PAF (paroxysmal atrial fibrillation) ([place]) 11/21/2012  . Greater trochanteric bursitis of both hips 05/21/2012  . Dizzinesses 11/07/2011  . 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 Osteoarthritis of left hip 07/03/2010  . [place] DISEASE, LUMBOSACRAL SPINE 05/18/2010  . SYNCOPE  10/27/2008  . Essential hypertension 06/16/2007  . GERD (gastroesophageal reflux disease) 06/16/2007  . COLONIC POLYPS, HX OF 06/16/2007    Past Surgical History:  Procedure Laterality Date  . ANTERIOR AND POSTERIOR VAGINAL REPAIR  01/2002   Archie Endo 01/23/2011  . APPENDECTOMY  1948  . CARDIAC CATHETERIZATION  06/26/2017  . CORONARY ANGIOPLASTY WITH STENT PLACEMENT  07/01/2017  . CORONARY ATHERECTOMY N/A 07/01/2017   Procedure: CORONARY ATHERECTOMY;  Surgeon: Belva Crome, MD;  Location: [facility];  Service: Cardiovascular;  Laterality: N/A;  . CORONARY STENT INTERVENTION N/A 07/01/2017   Procedure: CORONARY STENT INTERVENTION;  Surgeon: Belva Crome, MD;  Location: [facility];  Service: Cardiovascular;  Laterality: N/A;  . HAMMER TOE SURGERY    . HEMORRHOID BANDING    . HIP SURGERY Left 04/2009   hip examination under anesthesia followed by greater trochanteric bursectomy; iliotibial band tenotomy/notes 01/20/2011  . I & D EXTREMITY Right 01/10/2018   Procedure: IRRIGATION AND DEBRIDEMENT RIGHT KNEE, APPLY WOUND VAC;  Surgeon: Newt Minion, MD;  Location: [facility];  Service: Orthopedics;  Laterality: Right;  . KNEE BURSECTOMY Right 04/2009   Archie Endo 01/09/2011  . LEFT HEART CATH AND CORONARY ANGIOGRAPHY N/A 06/26/2017   Procedure: LEFT HEART CATH AND CORONARY ANGIOGRAPHY;  Surgeon: Belva Crome, MD;  Location: [facility];  Service: Cardiovascular;  Laterality: N/A;  . PUBOVAGINAL SLING  01/2002   Archie Endo 01/23/2011  . REDUCTION MAMMAPLASTY    . TEMPORARY PACEMAKER N/A 07/01/2017   Procedure: TEMPORARY PACEMAKER;  Surgeon: Belva Crome, MD;  Location: [facility];  Service: Cardiovascular;  Laterality: N/A;  . VAGINAL HYSTERECTOMY  01/2002   Vaginal hysterectomy, bilateral salpingo-oophorectomy/notes 01/23/2011     OB History    Gravida  2   Para  2   Term  2   Preterm      AB      Living  1     SAB      IAB      Ectopic      Multiple       Live Births              Family History  Problem Relation Age of Onset  . Colon cancer Mother   . Diabetes Father   . Prostate cancer Father   . Prostate cancer Brother   . Pancreatic cancer Brother   . Stomach cancer Son   . Heart attack Neg Hx   . Stroke Neg Hx   . Esophageal cancer Neg Hx     Social History   Tobacco Use  . Smoking status: Former Smoker    Packs/day: 0.25    Years: 28.00    Pack years: 7.00    Types: Cigarettes    Quit date: 1981    Years since quitting: 40.9  . Smokeless tobacco: Never Used  Vaping Use  . Vaping Use: Never used  Substance Use Topics  . Alcohol use: Yes    Alcohol/week: 7.0 standard drinks    Types:  7 Standard drinks or equivalent per week    Comment: 7 vodka drinks a week  . Drug use: No    Home Medications Prior to Admission medications   Medication Sig Start Date End Date Taking? Authorizing Provider  acetaminophen (TYLENOL) 500 MG tablet Take 1,000 mg by mouth every 6 (six) hours as needed (Coated or capsules).    [provider]  atorvastatin (LIPITOR) 80 MG tablet TAKE 1 TABLET DAILY AT 6PM. 05/05/20   Belva Crome, MD  betamethasone valerate ointment (VALISONE) 0.1 % Apply 1 application topically 2 (two) times daily. Apply twice a day for 4 weeks, once a day for 4 weeks, then twice weekly 05/30/20   Marny Lowenstein A, NP  cetirizine (ZYRTEC) 10 MG tablet Take 10 mg by mouth daily.    [provider]  DULoxetine (CYMBALTA) 60 MG capsule TAKE 1 CAPSULE DAILY. 06/15/20   Plotnikov, Evie Lacks, MD  ELIQUIS 2.5 MG TABS tablet TAKE 1 TABLET BY MOUTH TWICE DAILY. 08/15/20   Belva Crome, MD  esomeprazole (NEXIUM) 40 MG capsule TAKE (1) CAPSULE DAILY. 07/19/20   Plotnikov, Evie Lacks, MD  hyoscyamine (LEVSIN) 0.125 MG tablet TAKE 1-2 TABLETS (0.125-0.25 MG) BY MOUTH EVERY 4 HOURS AS NEEDED FOR UP TO 10 DAYS FOR CRAMPING. 03/27/20   Plotnikov, Evie Lacks, MD  LORazepam (ATIVAN) 1 MG tablet TAKE 1 TABLET TWICE  DAILY AS NEEDED FOR ANXIETY/SLEEP. 07/20/20   Plotnikov, Evie Lacks, MD  losartan (COZAAR) 25 MG tablet Take 1 tablet (25 mg total) by mouth daily. 07/07/20   Belva Crome, MD  metoprolol succinate (TOPROL XL) 25 MG 24 hr tablet Take 1 tablet (25 mg total) by mouth daily. 03/07/20   Tommie Raymond, NP  nitroGLYCERIN (NITROSTAT) 0.4 MG SL tablet Place 0.4 mg under the tongue every 5 (five) minutes as needed for chest pain (Call 911 at 3rd dose within 15 minutes.).     [provider]  NONFORMULARY OR COMPOUNDED ITEM Estradiol 0.02% vag cream prefilled applicators  S:Insert appful hs x first 2 weeks of use then insert appful (1 gram) intravaginally twice weekly. 02/23/20   Joseph Pierini, MD  ondansetron (ZOFRAN ODT) 4 MG disintegrating tablet Take 1 tablet (4 mg total) by mouth every 8 (eight) hours as needed for nausea or vomiting. 03/10/20   Plotnikov, Evie Lacks, MD  Probiotic Product (ALIGN PO) Take 1 capsule by mouth daily.    [provider]  sodium chloride (OCEAN) 0.65 % SOLN nasal spray Place 1 spray into both nostrils as needed for congestion.    [provider]  traMADol (ULTRAM) 50 MG tablet Take 0.5-1 tablets (25-50 mg total) by mouth every 6 (six) hours as needed for severe pain. 03/15/20   Plotnikov, Evie Lacks, MD    Allergies    Macrobid [nitrofurantoin monohyd macro], Meloxicam, Digoxin and related, Mobic [meloxicam], and Sulfamethoxazole-trimethoprim  Review of Systems   Review of Systems  Constitutional: Negative for chills and fever.  HENT: Negative for sore throat.   Eyes: Negative for redness.  Respiratory: Negative for shortness of breath.   Cardiovascular: Negative for chest pain.  Gastrointestinal: Positive for abdominal pain, nausea and vomiting.  Endocrine: Negative for polyuria.  Genitourinary: Negative for dysuria and flank pain.  Musculoskeletal: Negative for back pain.  Skin: Negative for rash.  Neurological: Negative for headaches.   Hematological: Does not bruise/bleed easily.  Psychiatric/Behavioral: Negative for confusion.    Physical Exam Updated Vital Signs There were no vitals taken  for this visit.  Physical Exam Vitals and nursing note reviewed.  Constitutional:      Appearance: Normal appearance. She is well-developed.  HENT:     Head: Atraumatic.     Nose: Nose normal.     Mouth/Throat:     Mouth: Mucous membranes are moist.  Eyes:     General: No scleral icterus.    Conjunctiva/sclera: Conjunctivae normal.  Neck:     Trachea: No tracheal deviation.  Cardiovascular:     Rate and Rhythm: Normal rate and regular rhythm.     Pulses: Normal pulses.     Heart sounds: Normal heart sounds. No murmur heard. No friction rub. No gallop.   Pulmonary:     Effort: Pulmonary effort is normal. No respiratory distress.     Breath sounds: Normal breath sounds.  Abdominal:     General: Bowel sounds are normal. There is no distension.     Palpations: Abdomen is soft. There is no mass.     Tenderness: There is abdominal tenderness. There is no guarding or rebound.     Hernia: No hernia is present.     Comments: Bilateral lower abd tenderness.   Genitourinary:    Comments: No cva tenderness.  Musculoskeletal:        General: No swelling.     Cervical back: Normal range of motion and neck supple. No rigidity. No muscular tenderness.  Skin:    General: Skin is warm and dry.     Findings: No rash.  Neurological:     Mental Status: She is alert.     Comments: Alert, speech normal.   Psychiatric:        Mood and Affect: Mood normal.      ED Results / Procedures / Treatments   Labs (all labs ordered are listed, but only abnormal results are displayed) Results for orders placed or performed during the hospital encounter of 08/27/20  Comprehensive metabolic panel  Result Value Ref Range   Sodium 139 135 - 145 mmol/L   Potassium 3.6 3.5 - 5.1 mmol/L   Chloride 112 (H) 98 - 111 mmol/L   CO2 14 (L) 22 -  32 mmol/L   Glucose, Bld 88 70 - 99 mg/dL   BUN 30 (H) 8 - 23 mg/dL   Creatinine, Ser 1.07 (H) 0.44 - 1.00 mg/dL   Calcium 7.7 (L) 8.9 - 10.3 mg/dL   Total Protein 5.7 (L) 6.5 - 8.1 g/dL   Albumin 3.2 (L) 3.5 - 5.0 g/dL   AST 28 15 - 41 U/L   ALT 14 0 - 44 U/L   Alkaline Phosphatase 73 38 - 126 U/L   Total Bilirubin 0.8 0.3 - 1.2 mg/dL   GFR, Estimated 52 (L) >60 mL/min   Anion gap 13 5 - 15  CBC  Result Value Ref Range   WBC 5.2 4.0 - 10.5 K/uL   RBC 3.94 3.87 - 5.11 MIL/uL   Hemoglobin 11.0 (L) 12.0 - 15.0 g/dL   HCT 37.6 36.0 - 46.0 %   MCV 95.4 80.0 - 100.0 fL   MCH 27.9 26.0 - 34.0 pg   MCHC 29.3 (L) 30.0 - 36.0 g/dL   RDW 15.3 11.5 - 15.5 %   Platelets 212 150 - 400 K/uL   nRBC 0.0 0.0 - 0.2 %   CT Abdomen Pelvis W Contrast  Result Date: 08/27/2020 CLINICAL DATA:  Mid to lower abdominal pain and vomiting since last night. Prior appendectomy and hysterectomy. EXAM: CT ABDOMEN  AND PELVIS WITH CONTRAST TECHNIQUE: Multidetector CT imaging of the abdomen and pelvis was performed using the standard protocol following bolus administration of intravenous contrast. CONTRAST:  49mL OMNIPAQUE IOHEXOL 300 MG/ML  SOLN COMPARISON:  02/24/2020 CT abdomen/pelvis. FINDINGS: Lower chest: Patchy consolidation and ground-glass opacity in the right middle lobe is new. Solid 4 mm right middle lobe pulmonary nodule (series 4/image 3). Coronary atherosclerosis. Hepatobiliary: Normal liver size. Two scattered subcentimeter hypodense right liver lesions are too small to characterize and not appreciably changed, considered benign. No new liver lesions. Normal gallbladder with no radiopaque cholelithiasis. No biliary ductal dilatation. Pancreas: Normal, with no mass or duct dilation. Spleen: Normal size. No mass. Adrenals/Urinary Tract: Normal adrenals. Simple left renal cysts, largest 2.4 cm in the upper left kidney. Additional scattered subcentimeter hypodense left renal cortical lesions are too small to  characterize and are unchanged, considered benign. No suspicious renal masses. No hydronephrosis. Normal bladder. Stomach/Bowel: Normal non-distended stomach. Normal caliber small bowel with no small bowel wall thickening. Appendectomy. New prominent wall thickening throughout the splenic flexure of the colon, descending colon and proximal sigmoid colon with associated pericolonic fat stranding and ill-defined fluid. Moderate sigmoid diverticulosis. Fluid-filled large bowel. Vascular/Lymphatic: Atherosclerotic nonaneurysmal abdominal aorta. Patent portal, splenic, hepatic and renal veins. No pathologically enlarged lymph nodes in the abdomen or pelvis. Reproductive: Status post hysterectomy, with no abnormal findings at the vaginal cuff. No adnexal mass. Other: No pneumoperitoneum. Small volume pelvic ascites. No focal fluid collection. Musculoskeletal: No aggressive appearing focal osseous lesions. Marked thoracolumbar spondylosis. Chronic mild T10 vertebral compression fracture. IMPRESSION: 1. New prominent wall thickening throughout the splenic flexure of the colon, descending colon and proximal sigmoid colon with associated pericolonic fat stranding and ill-defined fluid, most compatible with nonspecific infectious or inflammatory colitis, with the differential including C diff colitis. Fluid-filled large bowel loops indicating malabsorption. No free air. No abscess. No bowel obstruction. 2. Small volume pelvic ascites. 3. Moderate sigmoid diverticulosis. Findings are not suggestive of acute diverticulitis. 4. Patchy consolidation and ground-glass opacity in the right middle lobe, new, suggesting pneumonia/aspiration. 5. Solid 4 mm right middle lobe pulmonary nodule. No follow-up needed if patient is low-risk. Non-contrast chest CT can be considered in 12 months if patient is high-risk. This recommendation follows the consensus statement: Guidelines for Management of Incidental Pulmonary Nodules Detected on CT  Images: From the Fleischner Society 2017; Radiology 2017; [id number]. 6. Aortic Atherosclerosis (ICD10-I70.0). Electronically Signed   By: Ilona Sorrel M.D.   On: 08/27/2020 11:07    EKG None  Radiology CT Abdomen Pelvis W Contrast  Result Date: 08/27/2020 CLINICAL DATA:  Mid to lower abdominal pain and vomiting since last night. Prior appendectomy and hysterectomy. EXAM: CT ABDOMEN AND PELVIS WITH CONTRAST TECHNIQUE: Multidetector CT imaging of the abdomen and pelvis was performed using the standard protocol following bolus administration of intravenous contrast. CONTRAST:  34mL OMNIPAQUE IOHEXOL 300 MG/ML  SOLN COMPARISON:  02/24/2020 CT abdomen/pelvis. FINDINGS: Lower chest: Patchy consolidation and ground-glass opacity in the right middle lobe is new. Solid 4 mm right middle lobe pulmonary nodule (series 4/image 3). Coronary atherosclerosis. Hepatobiliary: Normal liver size. Two scattered subcentimeter hypodense right liver lesions are too small to characterize and not appreciably changed, considered benign. No new liver lesions. Normal gallbladder with no radiopaque cholelithiasis. No biliary ductal dilatation. Pancreas: Normal, with no mass or duct dilation. Spleen: Normal size. No mass. Adrenals/Urinary Tract: Normal adrenals. Simple left renal cysts, largest 2.4 cm in the upper left kidney. Additional scattered subcentimeter hypodense  left renal cortical lesions are too small to characterize and are unchanged, considered benign. No suspicious renal masses. No hydronephrosis. Normal bladder. Stomach/Bowel: Normal non-distended stomach. Normal caliber small bowel with no small bowel wall thickening. Appendectomy. New prominent wall thickening throughout the splenic flexure of the colon, descending colon and proximal sigmoid colon with associated pericolonic fat stranding and ill-defined fluid. Moderate sigmoid diverticulosis. Fluid-filled large bowel. Vascular/Lymphatic: Atherosclerotic  nonaneurysmal abdominal aorta. Patent portal, splenic, hepatic and renal veins. No pathologically enlarged lymph nodes in the abdomen or pelvis. Reproductive: Status post hysterectomy, with no abnormal findings at the vaginal cuff. No adnexal mass. Other: No pneumoperitoneum. Small volume pelvic ascites. No focal fluid collection. Musculoskeletal: No aggressive appearing focal osseous lesions. Marked thoracolumbar spondylosis. Chronic mild T10 vertebral compression fracture. IMPRESSION: 1. New prominent wall thickening throughout the splenic flexure of the colon, descending colon and proximal sigmoid colon with associated pericolonic fat stranding and ill-defined fluid, most compatible with nonspecific infectious or inflammatory colitis, with the differential including C diff colitis. Fluid-filled large bowel loops indicating malabsorption. No free air. No abscess. No bowel obstruction. 2. Small volume pelvic ascites. 3. Moderate sigmoid diverticulosis. Findings are not suggestive of acute diverticulitis. 4. Patchy consolidation and ground-glass opacity in the right middle lobe, new, suggesting pneumonia/aspiration. 5. Solid 4 mm right middle lobe pulmonary nodule. No follow-up needed if patient is low-risk. Non-contrast chest CT can be considered in 12 months if patient is high-risk. This recommendation follows the consensus statement: Guidelines for Management of Incidental Pulmonary Nodules Detected on CT Images: From the Fleischner Society 2017; Radiology 2017; [id number]. 6. Aortic Atherosclerosis (ICD10-I70.0). Electronically Signed   By: Ilona Sorrel M.D.   On: 08/27/2020 11:07    Procedures Procedures (including critical care time)  Medications Ordered in ED Medications  morphine 4 MG/ML injection 4 mg (has no administration in time range)  ondansetron (ZOFRAN) injection 4 mg (has no administration in time range)  sodium chloride 0.9 % bolus 1,000 mL (has no administration in time range)    ED  Course  I have reviewed the triage vital signs and the nursing notes.  Pertinent labs & imaging results that were available during my care of the patient were reviewed by me and considered in my medical decision making (see chart for details).    MDM Rules/Calculators/A&P                         Iv ns bolus. zofran iv. Morphine iv. Labs sent. Imaging ordered.   Reviewed nursing notes and prior charts for additional history.   MDM Number of Diagnoses or Management Options   Amount and/or Complexity of Data Reviewed Clinical lab tests: ordered and reviewed Tests in the radiology section of CPT: ordered and reviewed Tests in the medicine section of CPT: ordered and reviewed Discussion of test results with the performing providers: yes Decide to obtain previous medical records or to obtain history from someone other than the patient: yes Obtain history from someone other than the patient: yes Review and summarize past medical records: yes Discuss the patient with other providers: yes Independent visualization of images, tracings, or specimens: yes  Risk of Complications, Morbidity, and/or Mortality Presenting problems: high Diagnostic procedures: high Management options: high   Labs reviewed/interpreted by me - chem normal except hco3 low ?volume depletion/dehydration.   CT reviewed/interpreted by me - +colitis. Also note made ? pna - pt notes rare, non prod cough, not increased from baseline, no chest  pain or discomfort. No other uri symptoms.   Pain and abd tenderness persists. bp is soft. Blood cultures and lactate added to workup. Iv abx given. Additional ns bolus.   No recurrent vomiting. No diarrhea.   Hospitalists consulted for admission re colitis, persistent pain, nausea, poor po intake ?dehydration, soft bp.     Final Clinical Impression(s) / ED Diagnoses Final diagnoses:  None    Rx / DC Orders ED Discharge Orders    None       Lajean Saver,  MD 08/27/20 1211

## 2020-08-27 NOTE — Progress Notes (Signed)
Pharmacy Antibiotic Note  Hailey [name] is a 85 y.o. female admitted on 08/27/2020 with intra-abdominal infection.  Pharmacy has been consulted for Zosyn dosing.  Plan: Zosyn 3.375gm IV q8h (4hr extended infusions) No dose adjustments needed, Pharmacy will sign off   Temp (24hrs), Avg:97.8 F (36.6 C), Min:97.8 F (36.6 C), Max:97.8 F (36.6 C)  Recent Labs  Lab 08/27/20 0738 08/27/20 1136  WBC 5.2  --   CREATININE 1.07*  --   LATICACIDVEN  --  4.1*    CrCl cannot be calculated (Unknown ideal weight.).    Allergies  Allergen Reactions  . Macrobid [employer] Macro] Other (See Comments)    Nausea, stomach cramps, fatigue , headache.  . Meloxicam Other (See Comments)    Jittery and headache  . Digoxin And Related     headaches  . Mobic [Meloxicam]   . Sulfamethoxazole-Trimethoprim Nausea Only    Antimicrobials this admission: 12/18 Zosyn >>  Dose adjustments this admission:  Microbiology results: 12/18 BCx: sent 12/18 Respiratory panel: 12/18 C.diff: 12/18 GI panel:  Thank you for allowing pharmacy to be a part of this patient's care.  Peggyann Juba, PharmD, BCPS Pharmacy: [phone] 08/27/2020 1:34 PM

## 2020-08-27 NOTE — ED Notes (Signed)
Floor RN made aware of pt's recent BP and that Dr. [name] request to have fluids started as soon as possible when pt arrived to floor.

## 2020-08-27 NOTE — ED Notes (Signed)
Patient ambulated to the bathroom  Very unsteady gate/ 2 asst

## 2020-08-27 NOTE — ED Notes (Signed)
Patient's husband at bedside

## 2020-08-27 NOTE — Consult Note (Addendum)
CROSS COVER [facility] Reason for Consult: Acute colitis on CT scan. Referring Physician: [facility] hospitalist-Dr. Madelin Headings.  Hailey [name] is an 85 y.o. female.  HPI: Hailey [name] is a 85 year old white female with multiple medical problems listed below, who was in her usual state of health till 08/25/2020 when she developed severe nausea at midnight followed by acute abdominal pain about a vomiting.  Her symptoms seem to worsen thereafter.  She claims the pain became a 10 out of a 10 when her husband called I called 79. She was transported to the emergency room where she was found to have slight hypotension with tachycardia but was afebrile evaluation in the ER revealed nonspecific infectious versus inflammatory colitis on CT scan along with white count of 5.2 BUN of 30 and creatinine of 1.07. Patchy groundglass opacity in the right lower lobe suggesting pneumonia resolved for millimeters right middle lobe pulmonary nodule was noted patient received 2 L normal saline bolus in the ER along with morphine Zofran and Zosyn. She claims she is feeling much better now.  According to the review of her records her last colonoscopy was done in 2003 when she was noted to have a very tortuous and redundant colon with scattered diverticulosis and a small polyp was removed from the sigmoid colon. She denies having any diarrhea or constipation.  She has a history of reflux for which she takes PPIs.  Her mother had colon cancer in her 34s and her brother had pancreatic cancer in his 45's.  No history of recent antibiotic use  Past Medical History:  Diagnosis Date  . Acute blood loss anemia 12/25/2017  . Allergic rhinitis   . Anxiety   . Arthritis    "my whole spine" (07/01/2017)  . Atrial fibrillation ([place])   . Bowel obstruction ([place])    in [place]  . Bradycardia with 41-50 beats per minute 12/27/2017  . Cancer ([place])   . Cellulitis of leg, right with large prepatella hematoma and open wounds 12/26/2017  . CKD  (chronic kidney disease) stage 2, GFR 60-89 ml/min   . Colon polyps   . Coronary artery disease    10/18 PCI/DES to p/m LCx with cutting balloon to mLcx  . Diverticulosis of colon   . GERD (gastroesophageal reflux disease)   . Hip bursitis 2010   Dr Para March, Post op seroma  . History of colon polyps   . HSV (herpes simplex virus) anogenital infection 07/2019  . HTN (hypertension)   . IBS (irritable bowel syndrome)    constipation predominant - Dr Earlean Shawl  . Lichen sclerosus   . Osteopenia 11/2016   T score -2.0 FRAX 15%/4.3%  . PAC (premature atrial contraction)    Symptomatiic  . Renal insufficiency   . Right bundle branch block (RBBB) on electrocardiogram (ECG) 12/27/2017  . Scoliosis   . SVT (supraventricular tachycardia) ([place])    brief history   Past Surgical History:  Procedure Laterality Date  . ANTERIOR AND POSTERIOR VAGINAL REPAIR  01/2002   Archie Endo 01/23/2011  . APPENDECTOMY  1948  . CARDIAC CATHETERIZATION  06/26/2017  . CORONARY ANGIOPLASTY WITH STENT PLACEMENT  07/01/2017  . CORONARY ATHERECTOMY N/A 07/01/2017   Procedure: CORONARY ATHERECTOMY;  Surgeon: Belva Crome, MD;  Location: [facility];  Service: Cardiovascular;  Laterality: N/A;  . CORONARY STENT INTERVENTION N/A 07/01/2017   Procedure: CORONARY STENT INTERVENTION;  Surgeon: Belva Crome, MD;  Location: [facility];  Service: Cardiovascular;  Laterality: N/A;  .  HAMMER TOE SURGERY    . HEMORRHOID BANDING    . HIP SURGERY Left 04/2009   hip examination under anesthesia followed by greater trochanteric bursectomy; iliotibial band tenotomy/notes 01/20/2011  . I & D EXTREMITY Right 01/10/2018   Procedure: IRRIGATION AND DEBRIDEMENT RIGHT KNEE, APPLY WOUND VAC;  Surgeon: Newt Minion, MD;  Location: [facility];  Service: Orthopedics;  Laterality: Right;  . KNEE BURSECTOMY Right 04/2009   Archie Endo 01/09/2011  . LEFT HEART CATH AND CORONARY ANGIOGRAPHY N/A 06/26/2017   Procedure: LEFT HEART CATH AND  CORONARY ANGIOGRAPHY;  Surgeon: Belva Crome, MD;  Location: [facility];  Service: Cardiovascular;  Laterality: N/A;  . PUBOVAGINAL SLING  01/2002   Archie Endo 01/23/2011  . REDUCTION MAMMAPLASTY    . TEMPORARY PACEMAKER N/A 07/01/2017   Procedure: TEMPORARY PACEMAKER;  Surgeon: Belva Crome, MD;  Location: [facility];  Service: Cardiovascular;  Laterality: N/A;  . VAGINAL HYSTERECTOMY  01/2002   Vaginal hysterectomy, bilateral salpingo-oophorectomy/notes 01/23/2011   Family History  Problem Relation Age of Onset  . Colon cancer Mother   . Diabetes Father   . Prostate cancer Father   . Prostate cancer Brother   . Pancreatic cancer Brother   . Stomach cancer Son   . Heart attack Neg Hx   . Stroke Neg Hx   . Esophageal cancer Neg Hx    Social History:  reports that she quit smoking about 40 years ago. Her smoking use included cigarettes. She has a 7.00 pack-year smoking history. She has never used smokeless tobacco. She reports current alcohol use of about 7.0 standard drinks of alcohol per week. She reports that she does not use drugs.  Allergies:  Allergies  Allergen Reactions  . Macrobid [employer] Macro] Other (See Comments)    Nausea, stomach cramps, fatigue , headache.  . Meloxicam Other (See Comments)    Jittery and headache  . Digoxin And Related     headaches  . Mobic [Meloxicam]   . Sulfamethoxazole-Trimethoprim Nausea Only   Medications: I have reviewed the patient's current medications.  Results for orders placed or performed during the hospital encounter of 08/27/20 (from the past 48 hour(s))  Comprehensive metabolic panel     Status: Abnormal   Collection Time: 08/27/20  7:38 AM  Result Value Ref Range   Sodium 139 135 - 145 mmol/L   Potassium 3.6 3.5 - 5.1 mmol/L   Chloride 112 (H) 98 - 111 mmol/L   CO2 14 (L) 22 - 32 mmol/L   Glucose, Bld 88 70 - 99 mg/dL    Comment: Glucose reference range applies only to samples taken after  fasting for at least 8 hours.   BUN 30 (H) 8 - 23 mg/dL   Creatinine, Ser 1.07 (H) 0.44 - 1.00 mg/dL   Calcium 7.7 (L) 8.9 - 10.3 mg/dL   Total Protein 5.7 (L) 6.5 - 8.1 g/dL   Albumin 3.2 (L) 3.5 - 5.0 g/dL   AST 28 15 - 41 U/L   ALT 14 0 - 44 U/L   Alkaline Phosphatase 73 38 - 126 U/L   Total Bilirubin 0.8 0.3 - 1.2 mg/dL   GFR, Estimated 52 (L) >60 mL/min    Comment: (NOTE) Calculated using the CKD-EPI Creatinine Equation (2021)    Anion gap 13 5 - 15    Comment: Performed at [facility], [place] [address] [place], [place] [address]  CBC     Status: Abnormal  Collection Time: 08/27/20  7:38 AM  Result Value Ref Range   WBC 5.2 4.0 - 10.5 K/uL   RBC 3.94 3.87 - 5.11 MIL/uL   Hemoglobin 11.0 (L) 12.0 - 15.0 g/dL   HCT 37.6 36.0 - 46.0 %   MCV 95.4 80.0 - 100.0 fL   MCH 27.9 26.0 - 34.0 pg   MCHC 29.3 (L) 30.0 - 36.0 g/dL   RDW 15.3 11.5 - 15.5 %   Platelets 212 150 - 400 K/uL   nRBC 0.0 0.0 - 0.2 %    Comment: Performed at [facility], [place] [address], [place], [place] [address]  Lactic acid, plasma     Status: Abnormal   Collection Time: 08/27/20 11:36 AM  Result Value Ref Range   Lactic Acid, Venous 4.1 (HH) 0.5 - 1.9 mmol/L    Comment: CRITICAL RESULT CALLED TO, READ BACK BY AND VERIFIED WITH: SIMPSON,C RN @1323  ON 08/27/20 JACKSON,K Performed at [facility], [place] [address], [place], [place] [address]   Resp Panel by RT-PCR (Flu A&B, Covid) Nasopharyngeal Swab     Status: None   Collection Time: 08/27/20 12:13 PM   Specimen: Nasopharyngeal Swab; Nasopharyngeal(NP) swabs in vial transport medium  Result Value Ref Range   SARS Coronavirus 2 by RT PCR NEGATIVE NEGATIVE    Comment: (NOTE) SARS-CoV-2 target nucleic acids are NOT DETECTED.  The SARS-CoV-2 RNA is generally detectable in upper respiratory specimens during the acute phase of infection. The lowest concentration of SARS-CoV-2 viral copies this assay  can detect is 138 copies/mL. A negative result does not preclude SARS-Cov-2 infection and should not be used as the sole basis for treatment or other patient management decisions. A negative result may occur with  improper specimen collection/handling, submission of specimen other than nasopharyngeal swab, presence of viral mutation(s) within the areas targeted by this assay, and inadequate number of viral copies(<138 copies/mL). A negative result must be combined with clinical observations, patient history, and epidemiological information. The expected result is Negative.  Fact Sheet for Patients:  EntrepreneurPulse.com.au  Fact Sheet for Healthcare Providers:  IncredibleEmployment.be  This test is no t yet approved or cleared by the [place] FDA and  has been authorized for detection and/or diagnosis of SARS-CoV-2 by FDA under an Emergency Use Authorization (EUA). This EUA will remain  in effect (meaning this test can be used) for the duration of the COVID-19 declaration under Section 564(b)(1) of the Act, 21 U.S.C.section 360bbb-3(b)(1), unless the authorization is terminated  or revoked sooner.       Influenza A by PCR NEGATIVE NEGATIVE   Influenza B by PCR NEGATIVE NEGATIVE    Comment: (NOTE) The Xpert Xpress SARS-CoV-2/FLU/RSV plus assay is intended as an aid in the diagnosis of influenza from Nasopharyngeal swab specimens and should not be used as a sole basis for treatment. Nasal washings and aspirates are unacceptable for Xpert Xpress SARS-CoV-2/FLU/RSV testing.  Fact Sheet for Patients: EntrepreneurPulse.com.au  Fact Sheet for Healthcare Providers: IncredibleEmployment.be  This test is not yet approved or cleared by the [place] FDA and has been authorized for detection and/or diagnosis of SARS-CoV-2 by FDA under an Emergency Use Authorization (EUA). This EUA will remain in effect  (meaning this test can be used) for the duration of the COVID-19 declaration under Section 564(b)(1) of the Act, 21 U.S.C. section 360bbb-3(b)(1), unless the authorization is terminated or revoked.  Performed at [facility], [place] [address], [place], [place] [address]  CT Abdomen Pelvis W Contrast  Result Date: 08/27/2020 CLINICAL DATA:  Mid to lower abdominal pain and vomiting since last night. Prior appendectomy and hysterectomy. EXAM: CT ABDOMEN AND PELVIS WITH CONTRAST TECHNIQUE: Multidetector CT imaging of the abdomen and pelvis was performed using the standard protocol following bolus administration of intravenous contrast. CONTRAST:  33mL OMNIPAQUE IOHEXOL 300 MG/ML  SOLN COMPARISON:  02/24/2020 CT abdomen/pelvis. FINDINGS: Lower chest: Patchy consolidation and ground-glass opacity in the right middle lobe is new. Solid 4 mm right middle lobe pulmonary nodule (series 4/image 3). Coronary atherosclerosis. Hepatobiliary: Normal liver size. Two scattered subcentimeter hypodense right liver lesions are too small to characterize and not appreciably changed, considered benign. No new liver lesions. Normal gallbladder with no radiopaque cholelithiasis. No biliary ductal dilatation. Pancreas: Normal, with no mass or duct dilation. Spleen: Normal size. No mass. Adrenals/Urinary Tract: Normal adrenals. Simple left renal cysts, largest 2.4 cm in the upper left kidney. Additional scattered subcentimeter hypodense left renal cortical lesions are too small to characterize and are unchanged, considered benign. No suspicious renal masses. No hydronephrosis. Normal bladder. Stomach/Bowel: Normal non-distended stomach. Normal caliber small bowel with no small bowel wall thickening. Appendectomy. New prominent wall thickening throughout the splenic flexure of the colon, descending colon and proximal sigmoid colon with associated pericolonic fat stranding and ill-defined fluid. Moderate sigmoid  diverticulosis. Fluid-filled large bowel. Vascular/Lymphatic: Atherosclerotic nonaneurysmal abdominal aorta. Patent portal, splenic, hepatic and renal veins. No pathologically enlarged lymph nodes in the abdomen or pelvis. Reproductive: Status post hysterectomy, with no abnormal findings at the vaginal cuff. No adnexal mass. Other: No pneumoperitoneum. Small volume pelvic ascites. No focal fluid collection. Musculoskeletal: No aggressive appearing focal osseous lesions. Marked thoracolumbar spondylosis. Chronic mild T10 vertebral compression fracture. IMPRESSION: 1. New prominent wall thickening throughout the splenic flexure of the colon, descending colon and proximal sigmoid colon with associated pericolonic fat stranding and ill-defined fluid, most compatible with nonspecific infectious or inflammatory colitis, with the differential including C diff colitis. Fluid-filled large bowel loops indicating malabsorption. No free air. No abscess. No bowel obstruction. 2. Small volume pelvic ascites. 3. Moderate sigmoid diverticulosis. Findings are not suggestive of acute diverticulitis. 4. Patchy consolidation and ground-glass opacity in the right middle lobe, new, suggesting pneumonia/aspiration. 5. Solid 4 mm right middle lobe pulmonary nodule. No follow-up needed if patient is low-risk. Non-contrast chest CT can be considered in 12 months if patient is high-risk. This recommendation follows the consensus statement: Guidelines for Management of Incidental Pulmonary Nodules Detected on CT Images: From the Fleischner Society 2017; Radiology 2017; 284:228-243. 6. Aortic Atherosclerosis (ICD10-I70.0). Electronically Signed   By: Ilona Sorrel M.D.   On: 08/27/2020 11:07   Review of Systems  Constitutional: Positive for activity change, chills, diaphoresis and fatigue. Negative for appetite change and unexpected weight change.  HENT: Negative.   Eyes: Negative.   Respiratory: Negative.   Cardiovascular: Negative.    Gastrointestinal: Positive for abdominal distention, abdominal pain, diarrhea, nausea and vomiting. Negative for anal bleeding, blood in stool and constipation.  Endocrine: Negative.   Genitourinary: Negative.   Musculoskeletal: Positive for arthralgias.  Skin: Negative.   Allergic/Immunologic: Negative.   Neurological: Negative.   Hematological: Negative.   Psychiatric/Behavioral: Positive for agitation. Negative for behavioral problems, confusion, decreased concentration, dysphoric mood, hallucinations, self-injury, sleep disturbance and suicidal ideas. The patient is nervous/anxious. The patient is not hyperactive.    Blood pressure 96/66, pulse 80, temperature 97.8 F (36.6 C), temperature source Oral, resp. rate 11, SpO2 99 %. Physical Exam  Constitutional:      General: She is in acute distress.     Appearance: She is well-developed. She is ill-appearing. She is not diaphoretic.  HENT:     Head: Normocephalic and atraumatic.     Mouth/Throat:     Pharynx: Oropharynx is clear.  Eyes:     Extraocular Movements: Extraocular movements intact.     Pupils: Pupils are equal, round, and reactive to light.  Cardiovascular:     Rate and Rhythm: Normal rate and regular rhythm.     Heart sounds: Normal heart sounds. No murmur heard. No friction rub. No gallop.   Pulmonary:     Effort: Pulmonary effort is normal.     Breath sounds: Normal breath sounds.  Abdominal:     General: Abdomen is flat. Bowel sounds are normal.     Palpations: Abdomen is soft.     Tenderness: There is abdominal tenderness in the periumbilical area and left lower quadrant.  Skin:    General: Skin is warm and dry.  Neurological:     General: No focal deficit present.     Mental Status: She is alert and oriented to person, place, and time.     Motor: Weakness present.  Psychiatric:        Mood and Affect: Mood is anxious.        Behavior: Behavior normal.   Assessment/Plan: 1) Severe left lower lower  quadrant and periumbilical pain, with associated nausea and vomiting CT scan showing infectious versus inflammatory-wall thickening in the splenic flexure of the colon descending colon and proximal sigmoid colon with pericolic fat stranding and ill-defined fluid in the colon. We will continue with broad-spectrum antibiotics for now. She will need further evaluation with her primary gastroenterologist, Dr. Scarlette Shorts after discharge. 2) Moderate sigmoid diverticulosis with no evidence of diverticulitis. 3) History of GERD on PPI's. 4) History of IBS. 4) CAD status post DES/history of atrial fibrillation. 5) Depression. 6) Family history of colon cancer in her mother and pancreatic cancer in her brother. Juanita Craver 08/27/2020, 3:24 PM

## 2020-08-27 NOTE — ED Notes (Signed)
Coffee and crackers/peanut butter given to patients husband

## 2020-08-27 NOTE — ED Notes (Signed)
Patient transported to CT at this time. 

## 2020-08-27 NOTE — Progress Notes (Addendum)
Received pt from ED, a/o,  BP low, NS bolus started. Spouse at bedside with pt. Tele place ( A-Fib) and verified, skin warm and dry. Pt denies pain and discomfort. Pure wick patent. SRP, RN

## 2020-08-28 ENCOUNTER — Inpatient Hospital Stay (HOSPITAL_COMMUNITY): Payer: Medicare Other

## 2020-08-28 DIAGNOSIS — J9601 Acute respiratory failure with hypoxia: Secondary | ICD-10-CM

## 2020-08-28 DIAGNOSIS — R109 Unspecified abdominal pain: Secondary | ICD-10-CM

## 2020-08-28 DIAGNOSIS — I10 Essential (primary) hypertension: Secondary | ICD-10-CM

## 2020-08-28 LAB — GASTROINTESTINAL PANEL BY PCR, STOOL (REPLACES STOOL CULTURE)
Adenovirus F40/41: NOT DETECTED
Astrovirus: NOT DETECTED
Campylobacter species: DETECTED — AB
Cryptosporidium: NOT DETECTED
Cyclospora cayetanensis: NOT DETECTED
Entamoeba histolytica: NOT DETECTED
Enteroaggregative E coli (EAEC): NOT DETECTED
Enteropathogenic E coli (EPEC): DETECTED — AB
Enterotoxigenic E coli (ETEC): DETECTED — AB
Giardia lamblia: NOT DETECTED
Norovirus GI/GII: NOT DETECTED
Plesimonas shigelloides: NOT DETECTED
Rotavirus A: NOT DETECTED
Salmonella species: NOT DETECTED
Sapovirus (I, II, IV, and V): NOT DETECTED
Shiga like toxin producing E coli (STEC): NOT DETECTED
Shigella/Enteroinvasive E coli (EIEC): NOT DETECTED
Vibrio cholerae: NOT DETECTED
Vibrio species: NOT DETECTED
Yersinia enterocolitica: NOT DETECTED

## 2020-08-28 LAB — PROCALCITONIN: Procalcitonin: 4.79 ng/mL

## 2020-08-28 LAB — COMPREHENSIVE METABOLIC PANEL
ALT: 12 U/L (ref 0–44)
AST: 20 U/L (ref 15–41)
Albumin: 2.8 g/dL — ABNORMAL LOW (ref 3.5–5.0)
Alkaline Phosphatase: 50 U/L (ref 38–126)
Anion gap: 10 (ref 5–15)
BUN: 33 mg/dL — ABNORMAL HIGH (ref 8–23)
CO2: 17 mmol/L — ABNORMAL LOW (ref 22–32)
Calcium: 7.7 mg/dL — ABNORMAL LOW (ref 8.9–10.3)
Chloride: 110 mmol/L (ref 98–111)
Creatinine, Ser: 1.08 mg/dL — ABNORMAL HIGH (ref 0.44–1.00)
GFR, Estimated: 51 mL/min — ABNORMAL LOW (ref >60)
Glucose, Bld: 130 mg/dL — ABNORMAL HIGH (ref 70–99)
Potassium: 3.9 mmol/L (ref 3.5–5.1)
Sodium: 137 mmol/L (ref 135–145)
Total Bilirubin: 0.9 mg/dL (ref 0.3–1.2)
Total Protein: 5.4 g/dL — ABNORMAL LOW (ref 6.5–8.1)

## 2020-08-28 LAB — CBC
HCT: 29.1 % — ABNORMAL LOW (ref 36.0–46.0)
Hemoglobin: 9.4 g/dL — ABNORMAL LOW (ref 12.0–15.0)
MCH: 28.8 pg (ref 26.0–34.0)
MCHC: 32.3 g/dL (ref 30.0–36.0)
MCV: 89.3 fL (ref 80.0–100.0)
Platelets: 195 10*3/uL (ref 150–400)
RBC: 3.26 MIL/uL — ABNORMAL LOW (ref 3.87–5.11)
RDW: 15.3 % (ref 11.5–15.5)
WBC: 10.4 10*3/uL (ref 4.0–10.5)
nRBC: 0 % (ref 0.0–0.2)

## 2020-08-28 LAB — CLOSTRIDIUM DIFFICILE BY PCR, REFLEXED: Toxigenic C. Difficile by PCR: NEGATIVE

## 2020-08-28 MED ORDER — METOPROLOL SUCCINATE ER 25 MG PO TB24
25.0000 mg | ORAL_TABLET | Freq: Every day | ORAL | Status: DC
  Administered 2020-08-28 – 2020-08-30 (×3): 25 mg via ORAL
  Filled 2020-08-28 (×3): qty 1

## 2020-08-28 NOTE — Progress Notes (Signed)
NAME:  Hailey [name], MRN:  [id number], DOB:  1934/10/24, LOS: 1 ADMISSION DATE:  08/27/2020, CONSULTATION DATE: 12/18 REFERRING MD:  Nickolas Madrid  CHIEF COMPLAINT:  Low bp    Brief History:  89 yowf with h/o hbp/ PAF on doac admitted with 24 h abd pain with working dx  By ct ? colitis and dropped BP   PM 12/18  p one episode of emesis and one of diarrhea but not voluminous  and with sluggish response to vol challenge so  PCCM consulted and pt transferred to SDU.   History of Present Illness:  60  yowf  with past medical history of diverticulosis, GERD, CAD s/p DES, paroxysmal atrial fibrillation not on anticoagulation, IBS, depression, s/p appendectomy and hysterectomy who presented to the ED complaining of lower abdominal pain which started last night.  Described as constant, dull and nonradiating.    Husband ate the same food without any symptoms.  No known history of ulcerative colitis or Crohn's disease but mother died from colon cancer in her 10s and brother died from pancreatic cancer in his 71s.  No recent antibiotic use.  She denied any chest pain or shortness of breath, no back or flank pain.  Has had nausea and nonbloody vomiting but no diarrhea.  No blood in stool.  Denies any fevers, sick contacts.   ED Course: Afebrile, tachycardic, on room air Soft BPs. Notable Labs: Sodium 139, K3.6, chloride 112, CO2 14, glucose 88, BUN 30, creatinine 1.07, WBC 5.2, Hb 11.0, platelets 212. Notable Imaging: CT abdomen pelvis with contrast-compatible with nonspecific infectious or inflammatory colitis, small volume pelvic ascites, moderate sigmoid diverticulosis not suggestive of diverticulitis, patchy groundglass opacity in RML suggesting pneumonia and solid 4 mm RML pulmonary nodule. Patient received Morphine, Zofran, Zosyn, 2 L NS bolus.   On my eval 6 pm 12/18 pt a bit more comfortable p about 1.5 liters fluid. Last ate nl meal pm 12/17, last bp meds same am but none since. Only c/o = diffuse  non crampy abd pain s N or V or D in pm 12/18 .  Denies cp/sob.  Past Medical History:  HBP IHD / PAF GERD/IBS Diverticulosis    Significant Hospital Events:  Transferred to SDU pm 12/18   Consults:  GI 12/18  PCCM 12/18  Procedures:    Significant Diagnostic Tests:   CT abd 12/18 1. New prominent wall thickening throughout the splenic flexure of the colon, descending colon and proximal sigmoid colon with associated pericolonic fat stranding and ill-defined fluid, most compatible with nonspecific infectious or inflammatory colitis, with the differential including C diff colitis. Fluid-filled large bowel loops indicating malabsorption. No free air. No abscess. No bowel obstruction. 2. Small volume pelvic ascites. 3. Moderate sigmoid diverticulosis. Findings are not suggestive of acute diverticulitis. 4. Patchy consolidation and ground-glass opacity in the right middle lobe, new, suggesting pneumonia/aspiration.  Micro Data:  RESP viral panel PRC neg covid 19/ flu  BC x 2  12/18 >>> Stool for c diff pcr 12/18  >>>   POS  Stool for culutre 12/18 >>>  POS enteropathogenic E coli     Antimicrobials:  Zosyn 12/18    Scheduled Meds:  apixaban  2.5 mg Oral BID   Chlorhexidine Gluconate Cloth  6 each Topical Daily   DULoxetine  60 mg Oral Daily   mouth rinse  15 mL Mouth Rinse BID   pantoprazole  40 mg Oral Daily   sodium chloride flush  3 mL Intravenous  Q12H   Continuous Infusions:  piperacillin-tazobactam (ZOSYN)  IV Stopped (08/28/20 0722)   PRN Meds:.hyoscyamine, LORazepam, morphine injection, nitroGLYCERIN, ondansetron **OR** ondansetron (ZOFRAN) IV    Interim History / Subjective:  No change abd pain overnight, several more loose stools but bp improved with not all that much volume replacement needed and never required pressors   Objective   Blood pressure 97/63, pulse 95, temperature (!) 97.5 F (36.4 C), temperature source Oral, resp. rate (!)  21, height 5\' 3"  (1.6 m), weight 66.7 kg, SpO2 95 %.        Intake/Output Summary (Last 24 hours) at 08/28/2020 1011 Last data filed at 08/28/2020 0300 Gross per 24 hour  Intake 533.9 ml  Output 1 ml  Net 532.9 ml   Filed Weights   08/27/20 1854  Weight: 66.7 kg    Examination: General:  Nad/ sats 95 % 2lpm  Tmax:  99.6  Pt alert, approp nad @ 45 degrees  No jvd Oropharynx clear,  mucosa nl Neck supple Lungs clear  bilaterally RRR no s3 or or sign murmur Abd mod distended, diffusely tender with limite  excursion  Extr warm with no edema or clubbing noted Neuro  Sensorium intact,  no apparent motor deficits      I personally reviewed images and  impression as follows:  CXR:   12/19  No edema / some ssa bases         Resolved Hospital Problem list      Assessment & Plan:  1)Circulatory shock with med acidosis in setting of ? GI sepsis from colitis viral or c diff or o/w (though no risk factors for either) - GI w/u in progress / rx with zosyn ok unless this is c diff >> to see am 12/19  -  cortisol response appropriate/ hgb down a bit (see separate a/p)  >>> rx is supportive with plenty of IV fluids anticipating 3rd spacing over next 24 h regardless of cause of the abd pain  2) HBP with RAF    Improved rate am 12/19 but still in afib/ bp boderline with bnp relatively low   >> neo best pressor in view of tendency to RVR if does not respond to vol challenge     3) Pre renal azotemia/Metabolic acidosis s AG c/w diarrhea bicarb loss though the amt described so far is not voluminous  >> rx Lactated ringers should suffice for now as she's not having any increased wob to offset the acidosis at this point and doesn't need any help compensating.   >>> will increase IV fluids with LR if cxr ok 12/19 as still has a ways to go    4) Anemia mild/ on DOA for PAF    Lab Results  Component Value Date   HGB 9.4 (L) 08/28/2020   HGB 10.1 (L) 08/27/2020   HGB 11.0 (L)  08/27/2020   HGB 11.2 07/23/2017   HGB 10.5 (L) 07/09/2017   HGB 12.5 06/12/2017       Best practice (evaluated daily)  Diet: npo Pain/Anxiety/Delirium protocol (if indicated): per triad  VAP protocol (if indicated): n/a DVT prophylaxis: per triad  GI prophylaxis: per triad Glucose control: n/a Mobility: SBR Disposition: Step down      Labs   CBC: Recent Labs  Lab 08/27/20 0738 08/27/20 1825 08/28/20 0233  WBC 5.2 9.3 10.4  HGB 11.0* 10.1* 9.4*  HCT 37.6 32.0* 29.1*  MCV 95.4 90.4 89.3  PLT 212 220 195    Basic  Metabolic Panel: Recent Labs  Lab 08/27/20 0738 08/28/20 0233  NA 139 137  K 3.6 3.9  CL 112* 110  CO2 14* 17*  GLUCOSE 88 130*  BUN 30* 33*  CREATININE 1.07* 1.08*  CALCIUM 7.7* 7.7*   GFR: Estimated Creatinine Clearance: 36.2 mL/min (A) (by C-G formula based on SCr of 1.08 mg/dL (H)). Recent Labs  Lab 08/27/20 0738 08/27/20 1136 08/27/20 1640 08/27/20 1825 08/28/20 0233  PROCALCITON  --   --   --  5.17 4.79  WBC 5.2  --   --  9.3 10.4  LATICACIDVEN  --  4.1* 2.4* 1.8  --     Liver Function Tests: Recent Labs  Lab 08/27/20 0738 08/28/20 0233  AST 28 20  ALT 14 12  ALKPHOS 73 50  BILITOT 0.8 0.9  PROT 5.7* 5.4*  ALBUMIN 3.2* 2.8*   No results for input(s): LIPASE, AMYLASE in the last 168 hours. No results for input(s): AMMONIA in the last 168 hours.  ABG    Component Value Date/Time   TCO2 25 09/01/2013 1322     Coagulation Profile: No results for input(s): INR, PROTIME in the last 168 hours.  Cardiac Enzymes: No results for input(s): CKTOTAL, CKMB, CKMBINDEX, TROPONINI in the last 168 hours.  HbA1C: Hgb A1c MFr Bld  Date/Time Value Ref Range Status  07/04/2014 05:00 AM 5.6 <5.7 % Final    Comment:    (NOTE)                                                                       According to the ADA Clinical Practice Recommendations for 2011, when HbA1c is used as a screening test:  >=6.5%   Diagnostic of Diabetes  [place]           (if abnormal result is confirmed) 5.7-6.4%   Increased risk of developing Diabetes Mellitus References:Diagnosis and Classification of Diabetes Mellitus,Diabetes PPJK,9326,71(IWPYK 1):S62-S69 and Standards of Medical Care in         Diabetes - 2011,Diabetes DXIP,3825,05 (Suppl 1):S11-S61.    CBG: No results for input(s): GLUCAP in the last 168 hours.    The patient is critically ill with multiple organ systems failure and requires high complexity decision making for assessment and support, frequent evaluation and titration of therapies, application of advanced monitoring technologies and extensive interpretation of multiple databases. Critical Care Time devoted to patient care services described in this note is 35 minutes.    Christinia Gully, MD [facility] [place] [phone]   After 7:00 pm call Elink  [phone]

## 2020-08-28 NOTE — Progress Notes (Signed)
Pt transported to [facility], Handoff given to, [place], Therapist, sports, transferred to [id number]. SRP, RN

## 2020-08-28 NOTE — Progress Notes (Signed)
CROSS COVER [facility] Subjective: Since I last evaluated the patient, she seems to be doing much better since she was moved to SDU for hypotension. The diarrhea has improved significantly and she is no longer having any nausea or vomiting. She is still complaining of abdominal pain; it 2/20 if you dont palpate her abdomen but she rates it at a 10/10 "if you touch my belly". GI pathogen panel pending.  Objective: Vital signs in last 24 hours: Temp:  [97.5 F (36.4 C)-99.6 F (37.6 C)] 97.5 F (36.4 C) (12/19 0800) Pulse Rate:  [69-126] 105 (12/19 0600) Resp:  [11-27] 23 (12/19 0600) BP: (84-132)/(56-115) 108/69 (12/19 0600) SpO2:  [90 %-99 %] 95 % (12/19 0600) Weight:  [66.7 kg] 66.7 kg (12/18 1854) Last BM Date: 08/27/20  Intake/Output from previous day: 12/18 0701 - 12/19 0700 In: 533.9 [I.V.:485.5; IV Piggyback:48.4] Out: 1 [Stool:1] Intake/Output this shift: No intake/output data recorded.  General appearance: alert, cooperative, appears stated age, fatigued and no distress Resp: clear to auscultation bilaterally Cardio: regular rate and rhythm, S1, S2 normal, no murmur, click, rub or gallop GI: soft, slightly tender on palpation with gaurding; bowel sounds hyperactive; no masses,  no organomegaly Extremities: extremities normal, atraumatic, no cyanosis or edema  Lab Results: Recent Labs    08/27/20 0738 08/27/20 1825 08/28/20 0233  WBC 5.2 9.3 10.4  HGB 11.0* 10.1* 9.4*  HCT 37.6 32.0* 29.1*  PLT 212 220 195   BMET Recent Labs    08/27/20 0738 08/28/20 0233  NA 139 137  K 3.6 3.9  CL 112* 110  CO2 14* 17*  GLUCOSE 88 130*  BUN 30* 33*  CREATININE 1.07* 1.08*  CALCIUM 7.7* 7.7*   LFT Recent Labs    08/28/20 0233  PROT 5.4*  ALBUMIN 2.8*  AST 20  ALT 12  ALKPHOS 50  BILITOT 0.9   C-Diff Recent Labs    08/27/20 1331  CDIFFTOX NEGATIVE   Studies/Results: CT Abdomen Pelvis W Contrast  Result Date: 08/27/2020 CLINICAL DATA:  Mid to lower  abdominal pain and vomiting since last night. Prior appendectomy and hysterectomy. EXAM: CT ABDOMEN AND PELVIS WITH CONTRAST TECHNIQUE: Multidetector CT imaging of the abdomen and pelvis was performed using the standard protocol following bolus administration of intravenous contrast. CONTRAST:  76mL OMNIPAQUE IOHEXOL 300 MG/ML  SOLN COMPARISON:  02/24/2020 CT abdomen/pelvis. FINDINGS: Lower chest: Patchy consolidation and ground-glass opacity in the right middle lobe is new. Solid 4 mm right middle lobe pulmonary nodule (series 4/image 3). Coronary atherosclerosis. Hepatobiliary: Normal liver size. Two scattered subcentimeter hypodense right liver lesions are too small to characterize and not appreciably changed, considered benign. No new liver lesions. Normal gallbladder with no radiopaque cholelithiasis. No biliary ductal dilatation. Pancreas: Normal, with no mass or duct dilation. Spleen: Normal size. No mass. Adrenals/Urinary Tract: Normal adrenals. Simple left renal cysts, largest 2.4 cm in the upper left kidney. Additional scattered subcentimeter hypodense left renal cortical lesions are too small to characterize and are unchanged, considered benign. No suspicious renal masses. No hydronephrosis. Normal bladder. Stomach/Bowel: Normal non-distended stomach. Normal caliber small bowel with no small bowel wall thickening. Appendectomy. New prominent wall thickening throughout the splenic flexure of the colon, descending colon and proximal sigmoid colon with associated pericolonic fat stranding and ill-defined fluid. Moderate sigmoid diverticulosis. Fluid-filled large bowel. Vascular/Lymphatic: Atherosclerotic nonaneurysmal abdominal aorta. Patent portal, splenic, hepatic and renal veins. No pathologically enlarged lymph nodes in the abdomen or pelvis. Reproductive: Status post hysterectomy, with no abnormal findings at  the vaginal cuff. No adnexal mass. Other: No pneumoperitoneum. Small volume pelvic ascites. No  focal fluid collection. Musculoskeletal: No aggressive appearing focal osseous lesions. Marked thoracolumbar spondylosis. Chronic mild T10 vertebral compression fracture. IMPRESSION: 1. New prominent wall thickening throughout the splenic flexure of the colon, descending colon and proximal sigmoid colon with associated pericolonic fat stranding and ill-defined fluid, most compatible with nonspecific infectious or inflammatory colitis, with the differential including C diff colitis. Fluid-filled large bowel loops indicating malabsorption. No free air. No abscess. No bowel obstruction. 2. Small volume pelvic ascites. 3. Moderate sigmoid diverticulosis. Findings are not suggestive of acute diverticulitis. 4. Patchy consolidation and ground-glass opacity in the right middle lobe, new, suggesting pneumonia/aspiration. 5. Solid 4 mm right middle lobe pulmonary nodule. No follow-up needed if patient is low-risk. Non-contrast chest CT can be considered in 12 months if patient is high-risk. This recommendation follows the consensus statement: Guidelines for Management of Incidental Pulmonary Nodules Detected on CT Images: From the Fleischner Society 2017; Radiology 2017; 284:228-243. 6. Aortic Atherosclerosis (ICD10-I70.0). Electronically Signed   By: Ilona Sorrel M.D.   On: 08/27/2020 11:07   Medications: I have reviewed the patient's current medications.  Assessment/Plan: 1) Acute colitis with severe LLQ pain on admission-improved significantly on Zosyn. GI panel pending. 2) Sigmoid diverticulosis. 3) History of GERD on PPI's. 4) History of IBS. 4) CAD status post DES/history of atrial fibrillation. 5) Depression. 6) Family history of colon cancer in her mother and pancreatic cancer in her brother. 7) RML patchy consolidation-?aspiration pneumonia.  LOS: 1 day   Hailey [name] 08/28/2020, 8:42 AM

## 2020-08-28 NOTE — Progress Notes (Signed)
[facility]  PROGRESS NOTE  Hailey [name] MSX:[id number] DOB: 1935/09/03 DOA: 08/27/2020 PCP: Cassandria Anger, MD   Brief HPI:   85 year old female with medical history: Ulcers, GERD, CAD s/p DES, paroxysmal atrial fibrillation on anticoagulation, IBS, depression, s/p appendectomy and hysterectomy came to ED with complaints of low abdominal pain.  In the ED CT abdomen pelvis showed nonspecific infectious or inflammatory colitis, moderate sigmoid reticulosis not suggestive of diverticulitis.  Patchy groundglass opacities in right middle lobe cystic pneumonia and solid 4 mm RML pulmonary nodule. Patient was slow to respond to IV fluids so PCCM was consulted for circulatory shock with metabolic acidosis.   Subjective   Patient seen and examined, feels better this morning.  Denies diarrhea.  Still complains of abdominal pain.   Assessment/Plan:     1. Colitis-infectious versus inflammatory, slowly improving.  GI was consulted, recommend to continue with antibiotics.  Continue IV Zosyn. 2. Moderate sigmoid diverticulosis with no evidence of diverticulitis-stable. 3. History of GERD-continue PPIs.   4. A. fib with RVR-heart rate is well controlled, continue anticoagulation with apixaban.  Toprol was held due to hypotension yesterday. 5. Hypertension-blood pressure stable, will restart Toprol-XL 25 mg daily.  Continue to hold losartan. 6. CAD s/p DES-stable. 7. RML groundglass opacity-noted on CT, Alekh pneumonia due to afebrile status with no leukocytosis.  Patient is already on Zosyn as above.  We will continue to monitor.     COVID-19 Labs  No results for input(s): DDIMER, FERRITIN, LDH, CRP in the last 72 hours.  Lab Results  Component Value Date   [place] NEGATIVE 08/27/2020     Scheduled medications:   . apixaban  2.5 mg Oral BID  . Chlorhexidine Gluconate Cloth  6 each Topical Daily  . DULoxetine  60 mg Oral Daily  . mouth rinse  15 mL Mouth Rinse BID  .  pantoprazole  40 mg Oral Daily  . sodium chloride flush  3 mL Intravenous Q12H         CBG: No results for input(s): GLUCAP in the last 168 hours.  SpO2: 97 % O2 Flow Rate (L/min): 2 L/min    CBC: Recent Labs  Lab 08/27/20 0738 08/27/20 1825 08/28/20 0233  WBC 5.2 9.3 10.4  HGB 11.0* 10.1* 9.4*  HCT 37.6 32.0* 29.1*  MCV 95.4 90.4 89.3  PLT 212 220 233    Basic Metabolic Panel: Recent Labs  Lab 08/27/20 0738 08/28/20 0233  NA 139 137  K 3.6 3.9  CL 112* 110  CO2 14* 17*  GLUCOSE 88 130*  BUN 30* 33*  CREATININE 1.07* 1.08*  CALCIUM 7.7* 7.7*     Liver Function Tests: Recent Labs  Lab 08/27/20 0738 08/28/20 0233  AST 28 20  ALT 14 12  ALKPHOS 73 50  BILITOT 0.8 0.9  PROT 5.7* 5.4*  ALBUMIN 3.2* 2.8*     Antibiotics: Anti-infectives (From admission, onward)   Start     Dose/Rate Route Frequency Ordered Stop   08/27/20 2000  piperacillin-tazobactam (ZOSYN) IVPB 3.375 g        3.375 g 12.5 mL/hr over 240 Minutes Intravenous Every 8 hours 08/27/20 1338     08/27/20 1145  piperacillin-tazobactam (ZOSYN) IVPB 3.375 g        3.375 g 100 mL/hr over 30 Minutes Intravenous  Once 08/27/20 1136 08/27/20 1305       DVT prophylaxis:  Apixaban  Code Status: Full code  Family Communication: No family at bedside   Consultants:  PCCM  Procedures:      Objective   Vitals:   08/28/20 0900 08/28/20 1000 08/28/20 1100 08/28/20 1200  BP: 97/63 106/73 100/72 114/69  Pulse: 95 94 (!) 104 92  Resp: (!) 21 (!) 21 (!) 21 17  Temp:    (!) 97.4 F (36.3 C)  TempSrc:    Axillary  SpO2: 95% 96% 99% 97%  Weight:      Height:        Intake/Output Summary (Last 24 hours) at 08/28/2020 1403 Last data filed at 08/28/2020 0300 Gross per 24 hour  Intake 533.9 ml  Output --  Net 533.9 ml    12/17 1901 - 12/19 0700 In: 533.9 [I.V.:485.5] Out: 1   Filed Weights   08/27/20 1854  Weight: 66.7 kg    Physical Examination:   General-appears  in no acute distress Heart-S1-S2, regular, no murmur auscultated Lungs-clear to auscultation bilaterally, no wheezing or crackles auscultated Abdomen-soft, tenderness to palpation in left lower quadrant Extremities-no edema in the lower extremities Neuro-alert, oriented x3, no focal deficit noted  Status is: Inpatient  Dispo: The patient is from: Home              Anticipated d/c is to: Home              Anticipated d/c date is: 08/30/2020              Patient currently not stable for discharge  Barrier to discharge-ongoing treatment for colitis      Data Reviewed:   Recent Results (from the past 240 hour(s))  Resp Panel by RT-PCR (Flu A&B, Covid) Nasopharyngeal Swab     Status: None   Collection Time: 08/27/20 12:13 PM   Specimen: Nasopharyngeal Swab; Nasopharyngeal(NP) swabs in vial transport medium  Result Value Ref Range Status   SARS Coronavirus 2 by RT PCR NEGATIVE NEGATIVE Final    Comment: (NOTE) SARS-CoV-2 target nucleic acids are NOT DETECTED.  The SARS-CoV-2 RNA is generally detectable in upper respiratory specimens during the acute phase of infection. The lowest concentration of SARS-CoV-2 viral copies this assay can detect is 138 copies/mL. A negative result does not preclude SARS-Cov-2 infection and should not be used as the sole basis for treatment or other patient management decisions. A negative result may occur with  improper specimen collection/handling, submission of specimen other than nasopharyngeal swab, presence of viral mutation(s) within the areas targeted by this assay, and inadequate number of viral copies(<138 copies/mL). A negative result must be combined with clinical observations, patient history, and epidemiological information. The expected result is Negative.  Fact Sheet for Patients:  EntrepreneurPulse.com.au  Fact Sheet for Healthcare Providers:  IncredibleEmployment.be  This test is no t yet  approved or cleared by the [place] FDA and  has been authorized for detection and/or diagnosis of SARS-CoV-2 by FDA under an Emergency Use Authorization (EUA). This EUA will remain  in effect (meaning this test can be used) for the duration of the COVID-19 declaration under Section 564(b)(1) of the Act, 21 U.S.C.section 360bbb-3(b)(1), unless the authorization is terminated  or revoked sooner.       Influenza A by PCR NEGATIVE NEGATIVE Final   Influenza B by PCR NEGATIVE NEGATIVE Final    Comment: (NOTE) The Xpert Xpress SARS-CoV-2/FLU/RSV plus assay is intended as an aid in the diagnosis of influenza from Nasopharyngeal swab specimens and should not be used as a sole basis for treatment. Nasal washings and aspirates are unacceptable for Xpert Xpress SARS-CoV-2/FLU/RSV  testing.  Fact Sheet for Patients: EntrepreneurPulse.com.au  Fact Sheet for Healthcare Providers: IncredibleEmployment.be  This test is not yet approved or cleared by the [place] FDA and has been authorized for detection and/or diagnosis of SARS-CoV-2 by FDA under an Emergency Use Authorization (EUA). This EUA will remain in effect (meaning this test can be used) for the duration of the COVID-19 declaration under Section 564(b)(1) of the Act, 21 U.S.C. section 360bbb-3(b)(1), unless the authorization is terminated or revoked.  Performed at [facility], [place] [address] [place], [place] [address]   C Difficile Quick Screen w PCR reflex     Status: Abnormal   Collection Time: 08/27/20  1:31 PM   Specimen: STOOL  Result Value Ref Range Status   C Diff antigen POSITIVE (A) NEGATIVE Final   C Diff toxin NEGATIVE NEGATIVE Final   C Diff interpretation Results are indeterminate. See PCR results.  Final    Comment: Performed at [facility], [place] [address], [place], [place] [address]  Gastrointestinal Panel by PCR , Stool      Status: Abnormal   Collection Time: 08/27/20  1:31 PM   Specimen: Stool  Result Value Ref Range Status   Campylobacter species DETECTED (A) NOT DETECTED Final    Comment: RESULT CALLED TO, READ BACK BY AND VERIFIED WITH: SHANELLE VALENTINE AT 8841 ON 08/28/2020 [place].    Plesimonas shigelloides NOT DETECTED NOT DETECTED Final   Salmonella species NOT DETECTED NOT DETECTED Final   Yersinia enterocolitica NOT DETECTED NOT DETECTED Final   Vibrio species NOT DETECTED NOT DETECTED Final   Vibrio cholerae NOT DETECTED NOT DETECTED Final   Enteroaggregative E coli (EAEC) NOT DETECTED NOT DETECTED Final   Enteropathogenic E coli (EPEC) DETECTED (A) NOT DETECTED Final    Comment: RESULT CALLED TO, READ BACK BY AND VERIFIED WITH: SHANELLE VALENTINE AT 6606 ON 08/28/2020 [place].    Enterotoxigenic E coli (ETEC) DETECTED (A) NOT DETECTED Final    Comment: RESULT CALLED TO, READ BACK BY AND VERIFIED WITH: SHANELLE VALENTINE AT 3016 ON 08/28/2020 [place].    Shiga like toxin producing E coli (STEC) NOT DETECTED NOT DETECTED Final   Shigella/Enteroinvasive E coli (EIEC) NOT DETECTED NOT DETECTED Final   Cryptosporidium NOT DETECTED NOT DETECTED Final   Cyclospora cayetanensis NOT DETECTED NOT DETECTED Final   Entamoeba histolytica NOT DETECTED NOT DETECTED Final   Giardia lamblia NOT DETECTED NOT DETECTED Final   Adenovirus F40/41 NOT DETECTED NOT DETECTED Final   Astrovirus NOT DETECTED NOT DETECTED Final   Norovirus GI/GII NOT DETECTED NOT DETECTED Final   Rotavirus A NOT DETECTED NOT DETECTED Final   Sapovirus (I, II, IV, and V) NOT DETECTED NOT DETECTED Final    Comment: Performed at [facility], [address], [place], [place] [address]  C. Diff by PCR, Reflexed     Status: None   Collection Time: 08/27/20  1:31 PM  Result Value Ref Range Status   Toxigenic C. Difficile by PCR NEGATIVE NEGATIVE Final    Comment: Patient is colonized with non toxigenic C. difficile. May not need  treatment unless significant symptoms are present. Performed at [facility], [place] [address], [place], [place] [address]   Blood culture (routine x 2)     Status: None (Preliminary result)   Collection Time: 08/27/20  4:40 PM   Specimen: BLOOD  Result Value Ref Range Status   Specimen Description   Final    BLOOD RIGHT ANTECUBITAL Performed  at [facility], [place] [address] [place], [place] [address]    Special Requests   Final    BOTTLES DRAWN AEROBIC ONLY Blood Culture adequate volume Performed at [place] [address] [place], [place] [address]    Culture   Final    NO GROWTH < 24 HOURS Performed at [place] [address], [place], [place] [address]    Report Status PENDING  Incomplete  Blood culture (routine x 2)     Status: None (Preliminary result)   Collection Time: 08/27/20  4:40 PM   Specimen: BLOOD  Result Value Ref Range Status   Specimen Description   Final    BLOOD LEFT ANTECUBITAL Performed at [facility] [address] [place], [place] [address]    Special Requests   Final    BOTTLES DRAWN AEROBIC ONLY Blood Culture adequate volume Performed at [place] [address], [place], [place] [address]    Culture   Final    NO GROWTH < 24 HOURS Performed at [place] [address] [place], [place] [address]    Report Status PENDING  Incomplete  MRSA PCR Screening     Status: None   Collection Time: 08/27/20  6:37 PM   Specimen: Nasopharyngeal  Result Value Ref Range Status   MRSA by PCR NEGATIVE NEGATIVE Final    Comment:        The GeneXpert MRSA Assay (FDA approved for NASAL specimens only), is one component of a comprehensive MRSA colonization surveillance program. It is not intended to diagnose MRSA infection nor to guide or monitor treatment for MRSA infections. Performed at [facility], [place] [address],  [place],  [address]     No results for input(s): LIPASE, AMYLASE in the last 168 hours. No results for input(s): AMMONIA in the last 168 hours.  Cardiac Enzymes: No results for input(s): CKTOTAL, CKMB, CKMBINDEX, TROPONINI in the last 168 hours. BNP (last 3 results) Recent Labs    08/27/20 1825  BNP 320.9*    ProBNP (last 3 results) Recent Labs    02/11/20 1626 02/29/20 1208  PROBNP 2,383* 2,147*    Studies:  CT Abdomen Pelvis W Contrast  Result Date: 08/27/2020 CLINICAL DATA:  Mid to lower abdominal pain and vomiting since last night. Prior appendectomy and hysterectomy. EXAM: CT ABDOMEN AND PELVIS WITH CONTRAST TECHNIQUE: Multidetector CT imaging of the abdomen and pelvis was performed using the standard protocol following bolus administration of intravenous contrast. CONTRAST:  3mL OMNIPAQUE IOHEXOL 300 MG/ML  SOLN COMPARISON:  02/24/2020 CT abdomen/pelvis. FINDINGS: Lower chest: Patchy consolidation and ground-glass opacity in the right middle lobe is new. Solid 4 mm right middle lobe pulmonary nodule (series 4/image 3). Coronary atherosclerosis. Hepatobiliary: Normal liver size. Two scattered subcentimeter hypodense right liver lesions are too small to characterize and not appreciably changed, considered benign. No new liver lesions. Normal gallbladder with no radiopaque cholelithiasis. No biliary ductal dilatation. Pancreas: Normal, with no mass or duct dilation. Spleen: Normal size. No mass. Adrenals/Urinary Tract: Normal adrenals. Simple left renal cysts, largest 2.4 cm in the upper left kidney. Additional scattered subcentimeter hypodense left renal cortical lesions are too small to characterize and are unchanged, considered benign. No suspicious renal masses. No hydronephrosis. Normal bladder. Stomach/Bowel: Normal non-distended stomach. Normal caliber small bowel with no small bowel wall thickening. Appendectomy. New prominent wall thickening throughout the splenic flexure of  the colon, descending colon  and proximal sigmoid colon with associated pericolonic fat stranding and ill-defined fluid. Moderate sigmoid diverticulosis. Fluid-filled large bowel. Vascular/Lymphatic: Atherosclerotic nonaneurysmal abdominal aorta. Patent portal, splenic, hepatic and renal veins. No pathologically enlarged lymph nodes in the abdomen or pelvis. Reproductive: Status post hysterectomy, with no abnormal findings at the vaginal cuff. No adnexal mass. Other: No pneumoperitoneum. Small volume pelvic ascites. No focal fluid collection. Musculoskeletal: No aggressive appearing focal osseous lesions. Marked thoracolumbar spondylosis. Chronic mild T10 vertebral compression fracture. IMPRESSION: 1. New prominent wall thickening throughout the splenic flexure of the colon, descending colon and proximal sigmoid colon with associated pericolonic fat stranding and ill-defined fluid, most compatible with nonspecific infectious or inflammatory colitis, with the differential including C diff colitis. Fluid-filled large bowel loops indicating malabsorption. No free air. No abscess. No bowel obstruction. 2. Small volume pelvic ascites. 3. Moderate sigmoid diverticulosis. Findings are not suggestive of acute diverticulitis. 4. Patchy consolidation and ground-glass opacity in the right middle lobe, new, suggesting pneumonia/aspiration. 5. Solid 4 mm right middle lobe pulmonary nodule. No follow-up needed if patient is low-risk. Non-contrast chest CT can be considered in 12 months if patient is high-risk. This recommendation follows the consensus statement: Guidelines for Management of Incidental Pulmonary Nodules Detected on CT Images: From the Fleischner Society 2017; Radiology 2017; 284:228-243. 6. Aortic Atherosclerosis (ICD10-I70.0). Electronically Signed   By: Ilona Sorrel M.D.   On: 08/27/2020 11:07   DG Chest Port 1 View  Result Date: 08/28/2020 CLINICAL DATA:  Acute respiratory failure. EXAM: PORTABLE CHEST 1  VIEW COMPARISON:  Chest radiograph 07/01/2020 FINDINGS: Monitoring leads overlie the patient. Stable cardiac and mediastinal contours. Tortuosity of the thoracic aorta. Bandlike opacity right mid lung and left lower lung. No pleural effusion or pneumothorax. IMPRESSION: Bilateral bandlike opacities may represent atelectasis. Infection not excluded. Electronically Signed   By: Lovey Newcomer M.D.   On: 08/28/2020 13:08       [place]   [facility] If 7PM-7AM, please contact night-coverage at [email or web address], Office  [phone]   08/28/2020, 2:03 PM  LOS: 1 day

## 2020-08-28 NOTE — Progress Notes (Signed)
Primary nurse asked charge to verify levsin 0.125 tablets supplied from pharmacy because Epic kept saying the med was wrong. Called pharmacy to change dose as oral tablets were ordered of 0.125 Levsin, but sublingual tablets were supplied. Pharmacy states that they cannot change the order in the computer, but we only supply sublingual tablets for now. Instructed by pharmacy to administer sublingual

## 2020-08-29 DIAGNOSIS — K529 Noninfective gastroenteritis and colitis, unspecified: Secondary | ICD-10-CM

## 2020-08-29 DIAGNOSIS — R935 Abnormal findings on diagnostic imaging of other abdominal regions, including retroperitoneum: Secondary | ICD-10-CM

## 2020-08-29 DIAGNOSIS — J9601 Acute respiratory failure with hypoxia: Secondary | ICD-10-CM

## 2020-08-29 DIAGNOSIS — A0472 Enterocolitis due to Clostridium difficile, not specified as recurrent: Secondary | ICD-10-CM

## 2020-08-29 DIAGNOSIS — A09 Infectious gastroenteritis and colitis, unspecified: Secondary | ICD-10-CM

## 2020-08-29 DIAGNOSIS — E860 Dehydration: Secondary | ICD-10-CM

## 2020-08-29 DIAGNOSIS — R1084 Generalized abdominal pain: Secondary | ICD-10-CM

## 2020-08-29 DIAGNOSIS — Z8619 Personal history of other infectious and parasitic diseases: Secondary | ICD-10-CM

## 2020-08-29 DIAGNOSIS — A049 Bacterial intestinal infection, unspecified: Secondary | ICD-10-CM

## 2020-08-29 LAB — CBC
HCT: 26.4 % — ABNORMAL LOW (ref 36.0–46.0)
Hemoglobin: 8.3 g/dL — ABNORMAL LOW (ref 12.0–15.0)
MCH: 28.5 pg (ref 26.0–34.0)
MCHC: 31.4 g/dL (ref 30.0–36.0)
MCV: 90.7 fL (ref 80.0–100.0)
Platelets: 178 10*3/uL (ref 150–400)
RBC: 2.91 MIL/uL — ABNORMAL LOW (ref 3.87–5.11)
RDW: 15.5 % (ref 11.5–15.5)
WBC: 8.9 10*3/uL (ref 4.0–10.5)
nRBC: 0 % (ref 0.0–0.2)

## 2020-08-29 LAB — COMPREHENSIVE METABOLIC PANEL
ALT: 11 U/L (ref 0–44)
AST: 15 U/L (ref 15–41)
Albumin: 2.7 g/dL — ABNORMAL LOW (ref 3.5–5.0)
Alkaline Phosphatase: 51 U/L (ref 38–126)
Anion gap: 9 (ref 5–15)
BUN: 27 mg/dL — ABNORMAL HIGH (ref 8–23)
CO2: 20 mmol/L — ABNORMAL LOW (ref 22–32)
Calcium: 8.2 mg/dL — ABNORMAL LOW (ref 8.9–10.3)
Chloride: 108 mmol/L (ref 98–111)
Creatinine, Ser: 0.97 mg/dL (ref 0.44–1.00)
GFR, Estimated: 58 mL/min — ABNORMAL LOW (ref >60)
Glucose, Bld: 101 mg/dL — ABNORMAL HIGH (ref 70–99)
Potassium: 3.6 mmol/L (ref 3.5–5.1)
Sodium: 137 mmol/L (ref 135–145)
Total Bilirubin: 0.8 mg/dL (ref 0.3–1.2)
Total Protein: 5.2 g/dL — ABNORMAL LOW (ref 6.5–8.1)

## 2020-08-29 LAB — PROCALCITONIN: Procalcitonin: 2.88 ng/mL

## 2020-08-29 MED ORDER — AMOXICILLIN-POT CLAVULANATE 875-125 MG PO TABS
1.0000 | ORAL_TABLET | Freq: Two times a day (BID) | ORAL | Status: DC
  Administered 2020-08-29 – 2020-08-30 (×2): 1 via ORAL
  Filled 2020-08-29 (×2): qty 1

## 2020-08-29 MED ORDER — HYOSCYAMINE SULFATE 0.125 MG SL SUBL: 0.1250 mg | SUBLINGUAL_TABLET | SUBLINGUAL | Status: DC | PRN

## 2020-08-29 NOTE — Progress Notes (Signed)
[facility]  PROGRESS NOTE  Hailey [name] TML:[id number] DOB: [birth year] DOA: 08/27/2020 PCP: Cassandria Anger, MD   Brief HPI:   85 year old female with medical history: Ulcers, GERD, CAD s/p DES, paroxysmal atrial fibrillation on anticoagulation, IBS, depression, s/p appendectomy and hysterectomy came to ED with complaints of low abdominal pain.  In the ED CT abdomen pelvis showed nonspecific infectious or inflammatory colitis, moderate sigmoid reticulosis not suggestive of diverticulitis.  Patchy groundglass opacities in right middle lobe cystic pneumonia and solid 4 mm RML pulmonary nodule. Patient was slow to respond to IV fluids so PCCM was consulted for circulatory shock with metabolic acidosis.   Subjective   Patient seen and examined, feels better. Denies diarrhea.   Assessment/Plan:     1. Colitis-infectious versus inflammatory, slowly improving.  GI was consulted, recommend to continue with antibiotics.  Continue IV Zosyn. 2. Moderate sigmoid diverticulosis with no evidence of diverticulitis-stable. 3. History of GERD-continue PPIs.   4. A. fib with RVR-heart rate is well controlled, continue anticoagulation with apixaban.  Toprol was held due to hypotension yesterday. 5. Hypertension-blood pressure stable, will restart Toprol-XL 25 mg daily.  Continue to hold losartan. 6. CAD s/p DES-stable. 7. RML groundglass opacity-noted on CT, ? aspiration pneumonia   Patient is already on Zosyn as above. She does have elevated Pro calcitonin.  Likely discharge in am. We will start Augmentin po bid for 3 more days.     COVID-19 Labs  No results for input(s): DDIMER, FERRITIN, LDH, CRP in the last 72 hours.  Lab Results  Component Value Date   [place] NEGATIVE 08/27/2020     Scheduled medications:   . amoxicillin-clavulanate  1 tablet Oral Q12H  . apixaban  2.5 mg Oral BID  . Chlorhexidine Gluconate Cloth  6 each Topical Daily  . DULoxetine  60 mg Oral Daily   . mouth rinse  15 mL Mouth Rinse BID  . metoprolol succinate  25 mg Oral Daily  . pantoprazole  40 mg Oral Daily  . sodium chloride flush  3 mL Intravenous Q12H         CBG: No results for input(s): GLUCAP in the last 168 hours.  SpO2: 95 % O2 Flow Rate (L/min): 2 L/min    CBC: Recent Labs  Lab 08/27/20 0738 08/27/20 1825 08/28/20 0233 08/29/20 0221  WBC 5.2 9.3 10.4 8.9  HGB 11.0* 10.1* 9.4* 8.3*  HCT 37.6 32.0* 29.1* 26.4*  MCV 95.4 90.4 89.3 90.7  PLT 212 220 195 681    Basic Metabolic Panel: Recent Labs  Lab 08/27/20 0738 08/28/20 0233 08/29/20 0221  NA 139 137 137  K 3.6 3.9 3.6  CL 112* 110 108  CO2 14* 17* 20*  GLUCOSE 88 130* 101*  BUN 30* 33* 27*  CREATININE 1.07* 1.08* 0.97  CALCIUM 7.7* 7.7* 8.2*     Liver Function Tests: Recent Labs  Lab 08/27/20 0738 08/28/20 0233 08/29/20 0221  AST 28 20 15   ALT 14 12 11   ALKPHOS 73 50 51  BILITOT 0.8 0.9 0.8  PROT 5.7* 5.4* 5.2*  ALBUMIN 3.2* 2.8* 2.7*     Antibiotics: Anti-infectives (From admission, onward)   Start     Dose/Rate Route Frequency Ordered Stop   08/29/20 2200  amoxicillin-clavulanate (AUGMENTIN) 875-125 MG per tablet 1 tablet        1 tablet Oral Every 12 hours 08/29/20 1533     08/27/20 2000  piperacillin-tazobactam (ZOSYN) IVPB 3.375 g  Status:  Discontinued  3.375 g 12.5 mL/hr over 240 Minutes Intravenous Every 8 hours 08/27/20 1338 08/29/20 1412   08/27/20 1145  piperacillin-tazobactam (ZOSYN) IVPB 3.375 g        3.375 g 100 mL/hr over 30 Minutes Intravenous  Once 08/27/20 1136 08/27/20 1305       DVT prophylaxis:  Apixaban  Code Status: Full code  Family Communication: No family at bedside   Consultants:  PCCM  Procedures:      Objective   Vitals:   08/29/20 1000 08/29/20 1020 08/29/20 1100 08/29/20 1200  BP: 111/68 115/78 109/78 120/88  Pulse: 78 81 79 78  Resp: 20  15 20   Temp:    98.2 F (36.8 C)  TempSrc:    Oral  SpO2: 98%  96% 95%   Weight:      Height:        Intake/Output Summary (Last 24 hours) at 08/29/2020 1534 Last data filed at 08/29/2020 0850 Gross per 24 hour  Intake 195.36 ml  Output 350 ml  Net -154.64 ml    12/18 1901 - 12/20 0700 In: 679.3 [I.V.:485.5] Out: 250 [Urine:250]  Filed Weights   08/27/20 1854  Weight: 66.7 kg    Physical Examination: Physical Exam: Eyes: No icterus, extraocular muscles intact  Mouth: Oral mucosa is moist, no lesions on palate,  Neck: Supple, no deformities, masses, or tenderness Lungs: Normal respiratory effort, bilateral clear to auscultation, no crackles or wheezes.  Heart: Regular rate and rhythm, S1 and S2 normal, no murmurs, rubs auscultated Abdomen: BS normoactive,soft,nondistended,non-tender to palpation,no organomegaly Extremities: No pretibial edema, no erythema, no cyanosis, no clubbing Neuro : Alert and oriented to time, place and person, No focal deficits Skin: No rashes seen on exam   Status is: Inpatient  Dispo: The patient is from: Home              Anticipated d/c is to: Home              Anticipated d/c date is: 08/30/2020              Patient currently not stable for discharge  Barrier to discharge-ongoing treatment for colitis      Data Reviewed:   Recent Results (from the past 240 hour(s))  Resp Panel by RT-PCR (Flu A&B, Covid) Nasopharyngeal Swab     Status: None   Collection Time: 08/27/20 12:13 PM   Specimen: Nasopharyngeal Swab; Nasopharyngeal(NP) swabs in vial transport medium  Result Value Ref Range Status   SARS Coronavirus 2 by RT PCR NEGATIVE NEGATIVE Final    Comment: (NOTE) SARS-CoV-2 target nucleic acids are NOT DETECTED.  The SARS-CoV-2 RNA is generally detectable in upper respiratory specimens during the acute phase of infection. The lowest concentration of SARS-CoV-2 viral copies this assay can detect is 138 copies/mL. A negative result does not preclude SARS-Cov-2 infection and should not be used as the  sole basis for treatment or other patient management decisions. A negative result may occur with  improper specimen collection/handling, submission of specimen other than nasopharyngeal swab, presence of viral mutation(s) within the areas targeted by this assay, and inadequate number of viral copies(<138 copies/mL). A negative result must be combined with clinical observations, patient history, and epidemiological information. The expected result is Negative.  Fact Sheet for Patients:  EntrepreneurPulse.com.au  Fact Sheet for Healthcare Providers:  IncredibleEmployment.be  This test is no t yet approved or cleared by the [place] FDA and  has been authorized for detection and/or diagnosis of SARS-CoV-2  by FDA under an Emergency Use Authorization (EUA). This EUA will remain  in effect (meaning this test can be used) for the duration of the COVID-19 declaration under Section 564(b)(1) of the Act, 21 U.S.C.section 360bbb-3(b)(1), unless the authorization is terminated  or revoked sooner.       Influenza A by PCR NEGATIVE NEGATIVE Final   Influenza B by PCR NEGATIVE NEGATIVE Final    Comment: (NOTE) The Xpert Xpress SARS-CoV-2/FLU/RSV plus assay is intended as an aid in the diagnosis of influenza from Nasopharyngeal swab specimens and should not be used as a sole basis for treatment. Nasal washings and aspirates are unacceptable for Xpert Xpress SARS-CoV-2/FLU/RSV testing.  Fact Sheet for Patients: EntrepreneurPulse.com.au  Fact Sheet for Healthcare Providers: IncredibleEmployment.be  This test is not yet approved or cleared by the [place] FDA and has been authorized for detection and/or diagnosis of SARS-CoV-2 by FDA under an Emergency Use Authorization (EUA). This EUA will remain in effect (meaning this test can be used) for the duration of the COVID-19 declaration under Section 564(b)(1) of the  Act, 21 U.S.C. section 360bbb-3(b)(1), unless the authorization is terminated or revoked.  Performed at [facility], [place] [address] [place], [place] [address]   C Difficile Quick Screen w PCR reflex     Status: Abnormal   Collection Time: 08/27/20  1:31 PM   Specimen: STOOL  Result Value Ref Range Status   C Diff antigen POSITIVE (A) NEGATIVE Final   C Diff toxin NEGATIVE NEGATIVE Final   C Diff interpretation Results are indeterminate. See PCR results.  Final    Comment: Performed at [facility], [place] [address] [place], [place] [address]  Gastrointestinal Panel by PCR , Stool     Status: Abnormal   Collection Time: 08/27/20  1:31 PM   Specimen: Stool  Result Value Ref Range Status   Campylobacter species DETECTED (A) NOT DETECTED Final    Comment: RESULT CALLED TO, READ BACK BY AND VERIFIED WITH: SHANELLE VALENTINE AT 8177 ON 08/28/2020 [place].    Plesimonas shigelloides NOT DETECTED NOT DETECTED Final   Salmonella species NOT DETECTED NOT DETECTED Final   Yersinia enterocolitica NOT DETECTED NOT DETECTED Final   Vibrio species NOT DETECTED NOT DETECTED Final   Vibrio cholerae NOT DETECTED NOT DETECTED Final   Enteroaggregative E coli (EAEC) NOT DETECTED NOT DETECTED Final   Enteropathogenic E coli (EPEC) DETECTED (A) NOT DETECTED Final    Comment: RESULT CALLED TO, READ BACK BY AND VERIFIED WITH: SHANELLE VALENTINE AT 1165 ON 08/28/2020 [place].    Enterotoxigenic E coli (ETEC) DETECTED (A) NOT DETECTED Final    Comment: RESULT CALLED TO, READ BACK BY AND VERIFIED WITH: SHANELLE VALENTINE AT 7903 ON 08/28/2020 [place].    Shiga like toxin producing E coli (STEC) NOT DETECTED NOT DETECTED Final   Shigella/Enteroinvasive E coli (EIEC) NOT DETECTED NOT DETECTED Final   Cryptosporidium NOT DETECTED NOT DETECTED Final   Cyclospora cayetanensis NOT DETECTED NOT DETECTED Final   Entamoeba histolytica NOT DETECTED NOT DETECTED Final   Giardia lamblia  NOT DETECTED NOT DETECTED Final   Adenovirus F40/41 NOT DETECTED NOT DETECTED Final   Astrovirus NOT DETECTED NOT DETECTED Final   Norovirus GI/GII NOT DETECTED NOT DETECTED Final   Rotavirus A NOT DETECTED NOT DETECTED Final   Sapovirus (I, II, IV, and V) NOT DETECTED NOT DETECTED Final    Comment: Performed at [facility] [address], [place],  [address]  C. Diff  by PCR, Reflexed     Status: None   Collection Time: 08/27/20  1:31 PM  Result Value Ref Range Status   Toxigenic C. Difficile by PCR NEGATIVE NEGATIVE Final    Comment: Patient is colonized with non toxigenic C. difficile. May not need treatment unless significant symptoms are present. Performed at [facility], [place] [address] [place], [place] [address]   Blood culture (routine x 2)     Status: None (Preliminary result)   Collection Time: 08/27/20  4:40 PM   Specimen: BLOOD  Result Value Ref Range Status   Specimen Description   Final    BLOOD RIGHT ANTECUBITAL Performed at [place] [address] [place], [place] [address]    Special Requests   Final    BOTTLES DRAWN AEROBIC ONLY Blood Culture adequate volume Performed at [place] [address] [place], [place] [address]    Culture   Final    NO GROWTH 2 DAYS Performed at [place] [address], [place], [place] [address]    Report Status PENDING  Incomplete  Blood culture (routine x 2)     Status: None (Preliminary result)   Collection Time: 08/27/20  4:40 PM   Specimen: BLOOD  Result Value Ref Range Status   Specimen Description   Final    BLOOD LEFT ANTECUBITAL Performed at [place] [address] [place], [place] [address]    Special Requests   Final    BOTTLES DRAWN AEROBIC ONLY Blood Culture adequate volume Performed at [facility] [facility] [address], [place], [place] [address]    Culture   Final    NO GROWTH 2  DAYS Performed at [place] [address] [place], [place] [address]    Report Status PENDING  Incomplete  MRSA PCR Screening     Status: None   Collection Time: 08/27/20  6:37 PM   Specimen: Nasopharyngeal  Result Value Ref Range Status   MRSA by PCR NEGATIVE NEGATIVE Final    Comment:        The GeneXpert MRSA Assay (FDA approved for NASAL specimens only), is one component of a comprehensive MRSA colonization surveillance program. It is not intended to diagnose MRSA infection nor to guide or monitor treatment for MRSA infections. Performed at [facility], [place] [address], [place], [place] [address]     No results for input(s): LIPASE, AMYLASE in the last 168 hours. No results for input(s): AMMONIA in the last 168 hours.  Cardiac Enzymes: No results for input(s): CKTOTAL, CKMB, CKMBINDEX, TROPONINI in the last 168 hours. BNP (last 3 results) Recent Labs    08/27/20 1825  BNP 320.9*    ProBNP (last 3 results) Recent Labs    02/11/20 1626 02/29/20 1208  PROBNP 2,383* 2,147*    Studies:  DG Chest Port 1 View  Result Date: 08/28/2020 CLINICAL DATA:  Acute respiratory failure. EXAM: PORTABLE CHEST 1 VIEW COMPARISON:  Chest radiograph 07/01/2020 FINDINGS: Monitoring leads overlie the patient. Stable cardiac and mediastinal contours. Tortuosity of the thoracic aorta. Bandlike opacity right mid lung and left lower lung. No pleural effusion or pneumothorax. IMPRESSION: Bilateral bandlike opacities may represent atelectasis. Infection not excluded. Electronically Signed   By: Lovey Newcomer M.D.   On: 08/28/2020 13:08       [place]   [facility] If 7PM-7AM, please contact night-coverage at [email or web address], Office  [phone]  08/29/2020, 3:34 PM  LOS: 2 days

## 2020-08-29 NOTE — Plan of Care (Signed)

## 2020-08-29 NOTE — TOC Initial Note (Signed)
Transition of Care [facility]) - Initial/Assessment Note    Patient Details  Name: Hailey [name] MRN: [id number] Date of Birth: Apr 15, 1935  Transition of [facility] CM/SW Contact:    Leeroy Cha, RN Phone Number: 08/29/2020, 9:43 AM  Clinical Narrative:                 45 yowf with h/o hbp/ PAF on doac admitted with 24 h abd pain with working dx  By ct ? colitis and dropped BP   PM 12/18  p one episode of emesis and one of diarrhea but not voluminous  and with sluggish response to vol challenge so  PCCM consulted and pt transferred to SDU.   History of Present Illness:  65  yowf  with past medical history of diverticulosis, GERD, CAD s/p DES, paroxysmal atrial fibrillation not on anticoagulation, IBS, depression, s/p appendectomy and hysterectomy who presented to the ED complaining of lower abdominal pain which started last night. Described as constant, dull and nonradiating. Husband ate the same food without any symptoms. No known history of ulcerative colitis or Crohn's disease but mother died from colon cancer in her 77s and brother died from pancreatic cancer in his 63s. No recent antibiotic use. She denied any chest pain or shortness of breath, no back or flank pain. Has had nausea and nonbloodyvomitingbut no diarrhea. No blood in stool. Denies any fevers, sick contacts.  Plan homer with self care and family support Following for progression, 02 at 2l/min Ret[place],iv zosyn.  Expected Discharge Plan: Home/Self Care Barriers to Discharge: No Barriers Identified   Patient Goals and CMS Choice Patient states their goals for this hospitalization and ongoing recovery are:: to go home CMS Medicare.gov Compare Post Acute Care list provided to:: Patient Choice offered to / list presented to : Patient  Expected Discharge Plan and Services Expected Discharge Plan: Home/Self Care   Discharge Planning Services: CM Consult   Living arrangements for the past 2 months: Single Family  Home                                      Prior Living Arrangements/Services Living arrangements for the past 2 months: Single Family Home Lives with:: Spouse Patient language and need for interpreter reviewed:: Yes Do you feel safe going back to the place where you live?: Yes      Need for Family Participation in Patient Care: Yes (Comment) Care giver support system in place?: Yes (comment)   Criminal Activity/Legal Involvement Pertinent to Current Situation/Hospitalization: No - Comment as needed  Activities of Daily Living Home Assistive Devices/Equipment: None ADL Screening (condition at time of admission) Patient's cognitive ability adequate to safely complete daily activities?: Yes Is the patient deaf or have difficulty hearing?: No Does the patient have difficulty seeing, even when wearing glasses/contacts?: No Does the patient have difficulty concentrating, remembering, or making decisions?: No Patient able to express need for assistance with ADLs?: Yes Does the patient have difficulty dressing or bathing?: No Independently performs ADLs?: Yes (appropriate for developmental age) Does the patient have difficulty walking or climbing stairs?: No Weakness of Legs: Both Weakness of Arms/Hands: Both  Permission Sought/Granted                  Emotional Assessment Appearance:: Appears younger than stated age Attitude/Demeanor/Rapport: Engaged Affect (typically observed): Calm Orientation: : Oriented to Place,Oriented to Self,Oriented to  Time,Oriented to Situation  Alcohol / Substance Use: Not Applicable Psych Involvement: No (comment)  Admission diagnosis:  Dehydration [E86.0] Colitis [K52.9] Pulmonary infiltrate [R91.8] Left sided abdominal pain [R10.9] Acute colitis [K52.9] Patient Active Problem List   Diagnosis Date Noted  . Acute respiratory failure with hypoxia ([place])   . Acute colitis 08/27/2020  . Aortic atherosclerosis ([place]) 08/27/2020  . Acute  prerenal azotemia 08/27/2020  . Shock circulatory ([place]) 08/27/2020  . Metabolic acidosis with normal anion gap and bicarbonate losses 08/27/2020  . Dehydration   . Squamous cell cancer of skin of left cheek 07/04/2020  . Lichen sclerosus 66/59/9357  . Abdominal cramping 03/16/2020  . Neurogenic orthostatic hypotension ([place]) 09/16/2019  . Injury of face 08/31/2019  . Leg abrasion, infected, right, subsequent encounter 08/28/2019  . Face lacerations 08/26/2019  . Nose fracture 08/26/2019  . Concussion 08/26/2019  . Wrist pain, acute, right 08/26/2019  . Greater trochanteric pain syndrome of right lower extremity 07/30/2019  . Viral URI with cough 08/15/2018  . Abdominal pain 05/22/2018  . Traumatic hematoma of right knee 01/10/2018  . Ulcer of right knee ([place])   . Bradycardia with 41-50 beats per minute 12/27/2017  . Right bundle branch block (RBBB) on electrocardiogram (ECG) 12/27/2017  . Quadriceps tendon rupture 12/24/2017  . CKD (chronic kidney disease), stage II 12/24/2017  . Cellulitis of leg, right with large prepatella hematoma and open wounds 12/24/2017  . Iliotibial band syndrome of right side 12/16/2017  . Depression 07/11/2017  . Intervertebral lumbar disc disorder with myelopathy, lumbar region 04/25/2017  . Family history of colon cancer in mother 01/22/2017  . Primary osteoarthritis of both first carpometacarpal joints 01/02/2017  . Pain 11/21/2016  . Gastroenteritis 09/13/2016  . Chronic anticoagulation 05/24/2015  . Coronary artery disease involving native coronary artery of native heart with angina pectoris ([place]) 12/20/2014  . Iliotibial band syndrome of left side 11/16/2014  . Diverticulitis of colon 08/30/2014  . Chest pain, atypical 07/03/2014  . PAF (paroxysmal atrial fibrillation) ([place]) 11/21/2012  . Greater trochanteric bursitis of both hips 05/21/2012  . Dizzinesses 11/07/2011  . 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 Osteoarthritis of left hip  07/03/2010  . [place] DISEASE, LUMBOSACRAL SPINE 05/18/2010  . SYNCOPE 10/27/2008  . Essential hypertension 06/16/2007  . GERD (gastroesophageal reflux disease) 06/16/2007  . COLONIC POLYPS, HX OF 06/16/2007   PCP:  Plotnikov, Evie Lacks, MD Pharmacy:   [facility], [place] [address] Phone: [phone] Fax: [place], [place] [address] [place] [address] Phone: [phone] Fax: [phone]  [facility] [place], [place] [address] Phone: [phone] Fax: [phone]  [id number] - [place], [place] [address] [place] [address] Phone: [phone] Fax: [phone]     Social Determinants of Health (SDOH) Interventions    Readmission Risk Interventions No flowsheet data found.

## 2020-08-29 NOTE — Progress Notes (Addendum)
Soft, mi    [facility] Progress Note  CC:  N/V and diarrhea   Subjective:  She reports feeling better today. She slept last night. Slight nauseous this am. She passed one water stool yesterday with a few bits of solid stool. No bloody diarrhea. Her lower abdominal pain is less today, rates a 3 or 4 on scale of 1 to 10.    Objective:  Vital signs in last 24 hours: Temp:  [97.4 F (36.3 C)-98.1 F (36.7 C)] 98.1 F (36.7 C) (12/20 0000) Pulse Rate:  [40-105] 75 (12/20 0600) Resp:  [16-23] 19 (12/20 0600) BP: (97-133)/(63-102) 102/79 (12/20 0600) SpO2:  [50 %-100 %] 96 % (12/20 0600) Last BM Date: 08/27/20 General:   Alert 85 year old female in NAD. Heart: RRR, no mumur.  Pulm:  Breath sounds clear throughout. Abdomen: Soft, mild tenderness right and left of the umbilicus, no lower abdominal tenderness. No rebound or guarding.  Extremities:  LEs with 1+ edema, brawny discoloration to derm.  Neurologic:  Alert and  oriented x4;  grossly normal neurologically. Psych:  Alert and cooperative. Normal mood and affect.  Intake/Output from previous day: 12/19 0701 - 12/20 0700 In: 145.4 [IV Piggyback:145.4] Out: 250 [Urine:250] Intake/Output this shift: No intake/output data recorded.  Lab Results: Recent Labs    08/27/20 1825 08/28/20 0233 08/29/20 0221  WBC 9.3 10.4 8.9  HGB 10.1* 9.4* 8.3*  HCT 32.0* 29.1* 26.4*  PLT 220 195 178   BMET Recent Labs    08/27/20 0738 08/28/20 0233 08/29/20 0221  NA 139 137 137  K 3.6 3.9 3.6  CL 112* 110 108  CO2 14* 17* 20*  GLUCOSE 88 130* 101*  BUN 30* 33* 27*  CREATININE 1.07* 1.08* 0.97  CALCIUM 7.7* 7.7* 8.2*   LFT Recent Labs    08/29/20 0221  PROT 5.2*  ALBUMIN 2.7*  AST 15  ALT 11  ALKPHOS 51  BILITOT 0.8   PT/INR No results for input(s): LABPROT, INR in the last 72 hours. Hepatitis Panel No results for input(s): HEPBSAG, HCVAB, HEPAIGM, HEPBIGM in the last 72 hours.  CT Abdomen Pelvis W  Contrast  Result Date: 08/27/2020 CLINICAL DATA:  Mid to lower abdominal pain and vomiting since last night. Prior appendectomy and hysterectomy. EXAM: CT ABDOMEN AND PELVIS WITH CONTRAST TECHNIQUE: Multidetector CT imaging of the abdomen and pelvis was performed using the standard protocol following bolus administration of intravenous contrast. CONTRAST:  92mL OMNIPAQUE IOHEXOL 300 MG/ML  SOLN COMPARISON:  02/24/2020 CT abdomen/pelvis. FINDINGS: Lower chest: Patchy consolidation and ground-glass opacity in the right middle lobe is new. Solid 4 mm right middle lobe pulmonary nodule (series 4/image 3). Coronary atherosclerosis. Hepatobiliary: Normal liver size. Two scattered subcentimeter hypodense right liver lesions are too small to characterize and not appreciably changed, considered benign. No new liver lesions. Normal gallbladder with no radiopaque cholelithiasis. No biliary ductal dilatation. Pancreas: Normal, with no mass or duct dilation. Spleen: Normal size. No mass. Adrenals/Urinary Tract: Normal adrenals. Simple left renal cysts, largest 2.4 cm in the upper left kidney. Additional scattered subcentimeter hypodense left renal cortical lesions are too small to characterize and are unchanged, considered benign. No suspicious renal masses. No hydronephrosis. Normal bladder. Stomach/Bowel: Normal non-distended stomach. Normal caliber small bowel with no small bowel wall thickening. Appendectomy. New prominent wall thickening throughout the splenic flexure of the colon, descending colon and proximal sigmoid colon with associated pericolonic fat stranding and ill-defined fluid. Moderate sigmoid diverticulosis. Fluid-filled large bowel. Vascular/Lymphatic: Atherosclerotic  nonaneurysmal abdominal aorta. Patent portal, splenic, hepatic and renal veins. No pathologically enlarged lymph nodes in the abdomen or pelvis. Reproductive: Status post hysterectomy, with no abnormal findings at the vaginal cuff. No adnexal  mass. Other: No pneumoperitoneum. Small volume pelvic ascites. No focal fluid collection. Musculoskeletal: No aggressive appearing focal osseous lesions. Marked thoracolumbar spondylosis. Chronic mild T10 vertebral compression fracture. IMPRESSION: 1. New prominent wall thickening throughout the splenic flexure of the colon, descending colon and proximal sigmoid colon with associated pericolonic fat stranding and ill-defined fluid, most compatible with nonspecific infectious or inflammatory colitis, with the differential including C diff colitis. Fluid-filled large bowel loops indicating malabsorption. No free air. No abscess. No bowel obstruction. 2. Small volume pelvic ascites. 3. Moderate sigmoid diverticulosis. Findings are not suggestive of acute diverticulitis. 4. Patchy consolidation and ground-glass opacity in the right middle lobe, new, suggesting pneumonia/aspiration. 5. Solid 4 mm right middle lobe pulmonary nodule. No follow-up needed if patient is low-risk. Non-contrast chest CT can be considered in 12 months if patient is high-risk. This recommendation follows the consensus statement: Guidelines for Management of Incidental Pulmonary Nodules Detected on CT Images: From the Fleischner Society 2017; Radiology 2017; 284:228-243. 6. Aortic Atherosclerosis (ICD10-I70.0). Electronically Signed   By: Ilona Sorrel M.D.   On: 08/27/2020 11:07   DG Chest Port 1 View  Result Date: 08/28/2020 CLINICAL DATA:  Acute respiratory failure. EXAM: PORTABLE CHEST 1 VIEW COMPARISON:  Chest radiograph 07/01/2020 FINDINGS: Monitoring leads overlie the patient. Stable cardiac and mediastinal contours. Tortuosity of the thoracic aorta. Bandlike opacity right mid lung and left lower lung. No pleural effusion or pneumothorax. IMPRESSION: Bilateral bandlike opacities may represent atelectasis. Infection not excluded. Electronically Signed   By: Lovey Newcomer M.D.   On: 08/28/2020 13:08    Assessment / Plan:  1. 85 year  old female admitted to the ICU 12/18/2021with hypotension, metabolic acidosis, LLQ pain and diarrhea.  CTAP 12/18 identified wall thickening at the plenic flexure, descending colon and proximal sigmoid colon. Lactic acid 4.1 -> 1.8. C. Diff antigen positive (past colonization) with negative C. Diff toxin. GI pathogen panel + for EPEC, ETEC and Campylobacter. On Zosyn. Abdominal pain improving.  She had one episodes of watery diarrhea yesterday with bits of solid stool. No diarrhea this am. She is afebrile.  WBC 8.9. Hemodynamically stable. -EPEC and ETEC do not require antibiotic tx -Azithromycin not required for Campylobacter as she is nontoxic and symptoms are improving.  -Florastor probiotic one po bid -Zofran 4mg  po or IV Q 6 hrs as needed -Supportive care -Continue IV fluids  -Soft diet as tolerated -Possible discharge home later today if no further diarrhea  2. GERD -Continue Pantoprazole 4omg po QD  3. CAD s/p DES  4. Atrial fibrillation on Eliquis   5. Normocytic anemia, suspect dilutional component. No evidence of active GI bleeding. Hg 11.0 -> 8.3 -Repeat CBC in am  6. CT showed patchy consolidation and ground-glass opacity in the right middle lobe, new, suggesting pneumonia/aspirationa and  solid 4 mm right middle lobe pulmonary nodule. Sars Coronavirus negative. -Discontinue Zosyn if pneumonia no longer suspected, otherwise per primary service -Follow up with PCP   7. Family history of colon cancer. Last colonoscopy was 01/2012 which showed diverticulosis, no further colonoscopies recommended at that time due to age -Follow up in office with Dr. Henrene Pastor 2 to 4 weeks post hospital discharge   Principal Problem:   Acute colitis Active Problems:   Essential hypertension   PAF (paroxysmal atrial fibrillation) (  [place])   Depression   Aortic atherosclerosis ([place])   Acute prerenal azotemia   Shock circulatory (HCC)   Metabolic acidosis with normal anion gap and bicarbonate  losses   Dehydration     LOS: 2 days   Noralyn Pick  08/29/2020, 10:00AM  GI ATTENDING  Interval history and data reviewed.  Patient personally seen and examined.  Husband at bedside.  Agree with interval progress note as outlined above.  Patient has markedly improved since admission.  The working diagnosis is acute bacterial colitis with nausea, vomiting, diarrhea.  As well abdominal pain and corresponding area of CT abnormality demonstrating radiographic colitis.  At this point, she does not require specific antibiotic therapy for her enteric pathogens, she has demonstrated marked clinical improvement and resolution of diarrhea.  Recommend advancing diet as tolerated.  If she is doing well overnight, okay to discharge home tomorrow.  We will arrange for her to have GI follow-up with myself in 2 to 4 weeks.  Call for questions or problems.  Discussed with patient and her husband.  We will sign off.  Docia Chuck. Geri [place]., M.D. [facility] Division of Gastroenterology

## 2020-08-29 NOTE — Progress Notes (Signed)
NAME:  Hailey [name], MRN:  [id number], DOB:  [birth year], LOS: 2 ADMISSION DATE:  08/27/2020, CONSULTATION DATE: 12/18 REFERRING MD:  Nickolas Madrid  CHIEF COMPLAINT:  Low bp    Brief History:  66 yowf with h/o hbp/ PAF on doac admitted with 24 h abd pain with working dx  By ct ? colitis and dropped BP   PM 12/18  p one episode of emesis and one of diarrhea but not voluminous  and with sluggish response to vol challenge so  PCCM consulted and pt transferred to SDU.   History of Present Illness:  64  yowf  with past medical history of diverticulosis, GERD, CAD s/p DES, paroxysmal atrial fibrillation not on anticoagulation, IBS, depression, s/p appendectomy and hysterectomy who presented to the ED complaining of lower abdominal pain which started last night.  Described as constant, dull and nonradiating.    Husband ate the same food without any symptoms.  No known history of ulcerative colitis or Crohn's disease but mother died from colon cancer in her 73s and brother died from pancreatic cancer in his 54s.  No recent antibiotic use.  She denied any chest pain or shortness of breath, no back or flank pain.  Has had nausea and nonbloody vomiting but no diarrhea.  No blood in stool.  Denies any fevers, sick contacts.   ED Course: Afebrile, tachycardic, on room air Soft BPs. Notable Labs: Sodium 139, K3.6, chloride 112, CO2 14, glucose 88, BUN 30, creatinine 1.07, WBC 5.2, Hb 11.0, platelets 212. Notable Imaging: CT abdomen pelvis with contrast-compatible with nonspecific infectious or inflammatory colitis, small volume pelvic ascites, moderate sigmoid diverticulosis not suggestive of diverticulitis, patchy groundglass opacity in RML suggesting pneumonia and solid 4 mm RML pulmonary nodule. Patient received Morphine, Zofran, Zosyn, 2 L NS bolus.   On my eval 6 pm 12/18 pt a bit more comfortable p about 1.5 liters fluid. Last ate nl meal pm 12/17, last bp meds same am but none since. Only c/o = diffuse  non crampy abd pain s N or V or D in pm 12/18 .  Denies cp/sob.  Past Medical History:  HBP IHD / PAF GERD/IBS Diverticulosis    Significant Hospital Events:  Transferred to SDU pm 12/18   Consults:  GI 12/18  PCCM 12/18  Procedures:    Significant Diagnostic Tests:   CT abd 12/18 1. New prominent wall thickening throughout the splenic flexure of the colon, descending colon and proximal sigmoid colon with associated pericolonic fat stranding and ill-defined fluid, most compatible with nonspecific infectious or inflammatory colitis, with the differential including C diff colitis. Fluid-filled large bowel loops indicating malabsorption. No free air. No abscess. No bowel obstruction. 2. Small volume pelvic ascites. 3. Moderate sigmoid diverticulosis. Findings are not suggestive of acute diverticulitis. 4. Patchy consolidation and ground-glass opacity in the right middle lobe, new, suggesting pneumonia/aspiration.  Micro Data:  RESP viral panel PRC neg covid 19/ flu  BC x 2  12/18 >>> Stool for c diff pcr 12/18  >>>   POS  Stool for culutre 12/18 >>>  POS enteropathogenic E coli     Antimicrobials:  Zosyn 12/18    Scheduled Meds: . apixaban  2.5 mg Oral BID  . Chlorhexidine Gluconate Cloth  6 each Topical Daily  . DULoxetine  60 mg Oral Daily  . mouth rinse  15 mL Mouth Rinse BID  . metoprolol succinate  25 mg Oral Daily  . pantoprazole  40 mg Oral Daily  .  sodium chloride flush  3 mL Intravenous Q12H   Continuous Infusions: . piperacillin-tazobactam (ZOSYN)  IV 3.375 g (08/29/20 0445)   PRN Meds:.hyoscyamine, LORazepam, morphine injection, nitroGLYCERIN, ondansetron **OR** ondansetron (ZOFRAN) IV    Interim History / Subjective:  Much better, tol some liquids, less pain, no diarrhea   Objective   Blood pressure 102/79, pulse 75, temperature (!) 97.5 F (36.4 C), temperature source Oral, resp. rate 19, height 5\' 3"  (1.6 m), weight 66.7 kg, SpO2 96 %.         Intake/Output Summary (Last 24 hours) at 08/29/2020 8119 Last data filed at 08/29/2020 0300 Gross per 24 hour  Intake 145.36 ml  Output 250 ml  Net -104.64 ml   Filed Weights   08/27/20 1854  Weight: 66.7 kg    Examination: General:  NAD  On 2lpm sats 96%  Tmax:  98.1   Pt alert, approp nad @ 45 degrees hb No jvd Oropharynx clear,  mucosa nl Neck supple Lungs clear bilaterally RRR no s3 or or sign murmur Abd mild diffuse tenderness   Extr warm with no edema or clubbing noted Neuro  Sensorium intact,  no apparent motor deficits            Resolved Hospital Problem list      Assessment & Plan:  1)Circulatory shock with med acidosis in setting of ? GI sepsis from colitis viral or c diff or o/w (though no risk factors for either) - GI following / rx with zosyn  ? What to do about c diff Pos > defer to GI   >> improved am 12/20. PCT trending down  and ready for tx back to [facility]    2) HBP with RAF    Improved rate am 12/19 but still in afib/ bp boderline with bnp relatively low   >> keep volume up in absence of evidence of pulmonary edema and tendency to 3rd space/ diarrhea loss      3) Pre renal azotemia/Metabolic acidosis s AG c/w diarrhea bicarb loss though the amt described so far is not voluminous  >> rx Lactated ringers  125 cc /h  > improving am 12/20  4) Anemia mild/ on DOA for PAF    Lab Results  Component Value Date   HGB 8.3 (L) 08/29/2020   HGB 9.4 (L) 08/28/2020   HGB 10.1 (L) 08/27/2020   HGB 11.2 07/23/2017   HGB 10.5 (L) 07/09/2017   HGB 12.5 06/12/2017     tx for hgb < 7 in absence of hemodynamic instability or acute bleeding    PCCM f/u prn   Best practice (evaluated daily)  Diet:  Liquids/ advance per [facility]/GI  Pain/Anxiety/Delirium protocol (if indicated): per [facility]  VAP protocol (if indicated): n/a DVT prophylaxis: per [facility]  GI prophylaxis: per [facility] Glucose control: n/a Mobility: ok with me to try up in chair  Disposition:   Ok for floor      Labs   CBC: Recent Labs  Lab 08/27/20 0738 08/27/20 1825 08/28/20 0233 08/29/20 0221  WBC 5.2 9.3 10.4 8.9  HGB 11.0* 10.1* 9.4* 8.3*  HCT 37.6 32.0* 29.1* 26.4*  MCV 95.4 90.4 89.3 90.7  PLT 212 220 195 147    Basic Metabolic Panel: Recent Labs  Lab 08/27/20 0738 08/28/20 0233 08/29/20 0221  NA 139 137 137  K 3.6 3.9 3.6  CL 112* 110 108  CO2 14* 17* 20*  GLUCOSE 88 130* 101*  BUN 30* 33* 27*  CREATININE 1.07* 1.08*  0.97  CALCIUM 7.7* 7.7* 8.2*   GFR: Estimated Creatinine Clearance: 40.3 mL/min (by C-G formula based on SCr of 0.97 mg/dL). Recent Labs  Lab 08/27/20 0738 08/27/20 1136 08/27/20 1640 08/27/20 1825 08/28/20 0233 08/29/20 0221  PROCALCITON  --   --   --  5.17 4.79 2.88  WBC 5.2  --   --  9.3 10.4 8.9  LATICACIDVEN  --  4.1* 2.4* 1.8  --   --     Liver Function Tests: Recent Labs  Lab 08/27/20 0738 08/28/20 0233 08/29/20 0221  AST 28 20 15   ALT 14 12 11   ALKPHOS 73 50 51  BILITOT 0.8 0.9 0.8  PROT 5.7* 5.4* 5.2*  ALBUMIN 3.2* 2.8* 2.7*   No results for input(s): LIPASE, AMYLASE in the last 168 hours. No results for input(s): AMMONIA in the last 168 hours.  ABG    Component Value Date/Time   TCO2 25 09/01/2013 1322     Coagulation Profile: No results for input(s): INR, PROTIME in the last 168 hours.  Cardiac Enzymes: No results for input(s): CKTOTAL, CKMB, CKMBINDEX, TROPONINI in the last 168 hours.  HbA1C: Hgb A1c MFr Bld  Date/Time Value Ref Range Status  07/04/2014 05:00 AM 5.6 <5.7 % Final    Comment:    (NOTE)                                                                       According to the ADA Clinical Practice Recommendations for 2011, when HbA1c is used as a screening test:  >=6.5%   Diagnostic of Diabetes Mellitus           (if abnormal result is confirmed) 5.7-6.4%   Increased risk of developing Diabetes Mellitus References:Diagnosis and Classification of Diabetes  [place],Diabetes SEGB,1517,61(YWVPX 1):S62-S69 and Standards of Medical Care in         Diabetes - 2011,Diabetes TGGY,6948,54 (Suppl 1):S11-S61.    CBG: No results for input(s): GLUCAP in the last 168 hours.     Christinia Gully, MD Pulmonary and [place] [phone]   After 7:00 pm call Elink  [phone]

## 2020-08-30 LAB — COMPREHENSIVE METABOLIC PANEL
ALT: 11 U/L (ref 0–44)
AST: 14 U/L — ABNORMAL LOW (ref 15–41)
Albumin: 2.8 g/dL — ABNORMAL LOW (ref 3.5–5.0)
Alkaline Phosphatase: 48 U/L (ref 38–126)
Anion gap: 8 (ref 5–15)
BUN: 20 mg/dL (ref 8–23)
CO2: 20 mmol/L — ABNORMAL LOW (ref 22–32)
Calcium: 8.2 mg/dL — ABNORMAL LOW (ref 8.9–10.3)
Chloride: 107 mmol/L (ref 98–111)
Creatinine, Ser: 0.77 mg/dL (ref 0.44–1.00)
GFR, Estimated: >60 mL/min (ref >60)
Glucose, Bld: 98 mg/dL (ref 70–99)
Potassium: 3.5 mmol/L (ref 3.5–5.1)
Sodium: 135 mmol/L (ref 135–145)
Total Bilirubin: 0.6 mg/dL (ref 0.3–1.2)
Total Protein: 5.4 g/dL — ABNORMAL LOW (ref 6.5–8.1)

## 2020-08-30 LAB — CBC
HCT: 27.4 % — ABNORMAL LOW (ref 36.0–46.0)
Hemoglobin: 8.8 g/dL — ABNORMAL LOW (ref 12.0–15.0)
MCH: 28.6 pg (ref 26.0–34.0)
MCHC: 32.1 g/dL (ref 30.0–36.0)
MCV: 89 fL (ref 80.0–100.0)
Platelets: 200 10*3/uL (ref 150–400)
RBC: 3.08 MIL/uL — ABNORMAL LOW (ref 3.87–5.11)
RDW: 15.3 % (ref 11.5–15.5)
WBC: 7.7 10*3/uL (ref 4.0–10.5)
nRBC: 0 % (ref 0.0–0.2)

## 2020-08-30 MED ORDER — SACCHAROMYCES BOULARDII 250 MG PO CAPS: 250.0000 mg | ORAL_CAPSULE | Freq: Two times a day (BID) | ORAL | 2 refills | Status: DC

## 2020-08-30 MED ORDER — AMOXICILLIN-POT CLAVULANATE 875-125 MG PO TABS: 1.0000 | ORAL_TABLET | Freq: Two times a day (BID) | ORAL | 0 refills | Status: DC

## 2020-08-30 NOTE — Plan of Care (Signed)

## 2020-08-30 NOTE — Discharge Summary (Signed)
Physician Discharge Summary  Hailey [name] KVQ:[id number] DOB: Aug 18, 1935 DOA: 08/27/2020  PCP: Cassandria Anger, MD  Admit date: 08/27/2020 Discharge date: 08/30/2020  Time spent: 50 minutes  Recommendations for Outpatient Follow-up:  1. Follow-up gastroenterology in 2 weeks  Discharge Diagnoses:  Principal Problem:   Acute colitis Active Problems:   Essential hypertension   PAF (paroxysmal atrial fibrillation) (HCC)   Depression   Aortic atherosclerosis (HCC)   Acute prerenal azotemia   Shock circulatory (HCC)   Metabolic acidosis with normal anion gap and bicarbonate losses   Dehydration   Acute respiratory failure with hypoxia (HCC)   Bacterial colitis   Abnormal CT of the abdomen   Diarrhea of infectious origin   Discharge Condition: Stable  Diet recommendation: Heart healthy diet  Filed Weights   08/27/20 1854  Weight: 66.7 kg    History of present illness:  85 year old female with medical history: Ulcers, GERD, CAD s/p DES, paroxysmal atrial fibrillation on anticoagulation, IBS, depression, s/p appendectomy and hysterectomy came to ED with complaints of low abdominal pain.  In the ED CT abdomen pelvis showed nonspecific infectious or inflammatory colitis, moderate sigmoid reticulosis not suggestive of diverticulitis.  Patchy groundglass opacities in right middle lobe cystic pneumonia and solid 4 mm RML pulmonary nodule. Patient was slow to respond to IV fluids so PCCM was consulted for circulatory shock with metabolic acidosis  Hospital Course:   1. Colitis-patient presented with left lower quadrant pain, C. difficile antigen was positive with negative C. difficile toxin.  GI pathogen panel was positive for EPEC, ETEC and Campylobacter.  Patient was started on IV Zosyn.  At this time patient's symptoms have resolved.  GI has recommended to discharge on Florastor 1 p.o. twice daily.    2. Moderate sigmoid diverticulosis with no evidence of  diverticulitis-stable. 3. History of GERD-continue PPIs.   4. A. fib with RVR-heart rate is well controlled, continue anticoagulation with apixaban.    Continue Toprol-XL. 5. Hypertension-blood pressure stable, continue home medications, Toprol-XL and losartan.   6. CAD s/p DES-stable. 7. RML groundglass opacity-noted on CT, ? aspiration pneumonia   Patient is already on Zosyn as above. She does have elevated Pro calcitonin.  Likely discharge in am. We will  discharge on Augmentin po bid for 2 more days.   Procedures:    Consultations:  Gastroenterology  Discharge Exam: Vitals:   08/30/20 0808 08/30/20 0939  BP: 137/86 (!) 147/99  Pulse: 70 74  Resp:    Temp:    SpO2:      General: Appears in no acute distress  Cardiovascular: S1-S2, regular Respiratory: Clear to auscultation bilaterally  Discharge Instructions   Discharge Instructions    Diet - low sodium heart healthy   Complete by: As directed    Increase activity slowly   Complete by: As directed      Allergies as of 08/30/2020      Reactions   Macrobid [nitrofurantoin Monohyd Macro] Other (See Comments)   Nausea, stomach cramps, fatigue , headache.   Meloxicam Other (See Comments)   Jittery and headache   Digoxin And Related    headaches   Mobic [meloxicam]    Sulfamethoxazole-trimethoprim Nausea Only      Medication List    STOP taking these medications   ALIGN PO     TAKE these medications   acetaminophen 500 MG tablet Commonly known as: TYLENOL Take 1,000 mg by mouth every 6 (six) hours as needed (body aches / sore hip).  amoxicillin-clavulanate 875-125 MG tablet Commonly known as: AUGMENTIN Take 1 tablet by mouth every 12 (twelve) hours.   atorvastatin 80 MG tablet Commonly known as: LIPITOR TAKE 1 TABLET DAILY AT 6PM. What changed: See the new instructions.   cetirizine 10 MG tablet Commonly known as: ZYRTEC Take 10 mg by mouth daily as needed for allergies.   DULoxetine 60 MG  capsule Commonly known as: CYMBALTA TAKE 1 CAPSULE DAILY.   Eliquis 2.5 MG Tabs tablet Generic drug: apixaban TAKE 1 TABLET BY MOUTH TWICE DAILY. What changed: how much to take   esomeprazole 40 MG capsule Commonly known as: NEXIUM TAKE (1) CAPSULE DAILY. What changed: See the new instructions.   hyoscyamine 0.125 MG tablet Commonly known as: LEVSIN TAKE 1-2 TABLETS (0.125-0.25 MG) BY MOUTH EVERY 4 HOURS AS NEEDED FOR UP TO 10 DAYS FOR CRAMPING. What changed: See the new instructions.   LORazepam 1 MG tablet Commonly known as: ATIVAN TAKE 1 TABLET TWICE DAILY AS NEEDED FOR ANXIETY/SLEEP. What changed: See the new instructions.   losartan 25 MG tablet Commonly known as: COZAAR Take 1 tablet (25 mg total) by mouth daily.   metoprolol succinate 25 MG 24 hr tablet Commonly known as: Toprol XL Take 1 tablet (25 mg total) by mouth daily.   nitroGLYCERIN 0.4 MG SL tablet Commonly known as: NITROSTAT Place 0.4 mg under the tongue every 5 (five) minutes as needed for chest pain (Call 911 at 3rd dose within 15 minutes.).   ondansetron 4 MG disintegrating tablet Commonly known as: Zofran ODT Take 1 tablet (4 mg total) by mouth every 8 (eight) hours as needed for nausea or vomiting.   saccharomyces boulardii 250 MG capsule Commonly known as: Florastor Take 1 capsule (250 mg total) by mouth 2 (two) times daily.   sodium chloride 0.65 % Soln nasal spray Commonly known as: OCEAN Place 1 spray into both nostrils as needed for congestion.   traMADol 50 MG tablet Commonly known as: ULTRAM Take 0.5-1 tablets (25-50 mg total) by mouth every 6 (six) hours as needed for severe pain.      Allergies  Allergen Reactions  . Macrobid [employer] Macro] Other (See Comments)    Nausea, stomach cramps, fatigue , headache.  . Meloxicam Other (See Comments)    Jittery and headache  . Digoxin And Related     headaches  . Mobic [Meloxicam]   . Sulfamethoxazole-Trimethoprim  Nausea Only    Follow-up Information    Irene Shipper, MD Follow up on 09/26/2020.   Specialty: Gastroenterology Why: Follow up appointment with Dr. Scarlette Shorts on Monday January 17, 20222 at 3:40pm Contact information: [address]  [place] [address] [phone]        Plotnikov, Evie Lacks, MD Follow up in 2 week(s).   Specialty: Internal Medicine Contact information: [place] [address] [phone]        Belva Crome, MD .   Specialty: Cardiology Contact information: [phone] [address] [place] [address] [phone]                The results of significant diagnostics from this hospitalization (including imaging, microbiology, ancillary and laboratory) are listed below for reference.    Significant Diagnostic Studies: CT Abdomen Pelvis W Contrast  Result Date: 08/27/2020 CLINICAL DATA:  Mid to lower abdominal pain and vomiting since last night. Prior appendectomy and hysterectomy. EXAM: CT ABDOMEN AND PELVIS WITH CONTRAST TECHNIQUE: Multidetector CT imaging of the abdomen  and pelvis was performed using the standard protocol following bolus administration of intravenous contrast. CONTRAST:  13mL OMNIPAQUE IOHEXOL 300 MG/ML  SOLN COMPARISON:  02/24/2020 CT abdomen/pelvis. FINDINGS: Lower chest: Patchy consolidation and ground-glass opacity in the right middle lobe is new. Solid 4 mm right middle lobe pulmonary nodule (series 4/image 3). Coronary atherosclerosis. Hepatobiliary: Normal liver size. Two scattered subcentimeter hypodense right liver lesions are too small to characterize and not appreciably changed, considered benign. No new liver lesions. Normal gallbladder with no radiopaque cholelithiasis. No biliary ductal dilatation. Pancreas: Normal, with no mass or duct dilation. Spleen: Normal size. No mass. Adrenals/Urinary Tract: Normal adrenals. Simple left renal cysts, largest 2.4 cm in the upper left kidney.  Additional scattered subcentimeter hypodense left renal cortical lesions are too small to characterize and are unchanged, considered benign. No suspicious renal masses. No hydronephrosis. Normal bladder. Stomach/Bowel: Normal non-distended stomach. Normal caliber small bowel with no small bowel wall thickening. Appendectomy. New prominent wall thickening throughout the splenic flexure of the colon, descending colon and proximal sigmoid colon with associated pericolonic fat stranding and ill-defined fluid. Moderate sigmoid diverticulosis. Fluid-filled large bowel. Vascular/Lymphatic: Atherosclerotic nonaneurysmal abdominal aorta. Patent portal, splenic, hepatic and renal veins. No pathologically enlarged lymph nodes in the abdomen or pelvis. Reproductive: Status post hysterectomy, with no abnormal findings at the vaginal cuff. No adnexal mass. Other: No pneumoperitoneum. Small volume pelvic ascites. No focal fluid collection. Musculoskeletal: No aggressive appearing focal osseous lesions. Marked thoracolumbar spondylosis. Chronic mild T10 vertebral compression fracture. IMPRESSION: 1. New prominent wall thickening throughout the splenic flexure of the colon, descending colon and proximal sigmoid colon with associated pericolonic fat stranding and ill-defined fluid, most compatible with nonspecific infectious or inflammatory colitis, with the differential including C diff colitis. Fluid-filled large bowel loops indicating malabsorption. No free air. No abscess. No bowel obstruction. 2. Small volume pelvic ascites. 3. Moderate sigmoid diverticulosis. Findings are not suggestive of acute diverticulitis. 4. Patchy consolidation and ground-glass opacity in the right middle lobe, new, suggesting pneumonia/aspiration. 5. Solid 4 mm right middle lobe pulmonary nodule. No follow-up needed if patient is low-risk. Non-contrast chest CT can be considered in 12 months if patient is high-risk. This recommendation follows the  consensus statement: Guidelines for Management of Incidental Pulmonary Nodules Detected on CT Images: From the Fleischner Society 2017; Radiology 2017; 284:228-243. 6. Aortic Atherosclerosis (ICD10-I70.0). Electronically Signed   By: Ilona Sorrel M.D.   On: 08/27/2020 11:07   DG Chest Port 1 View  Result Date: 08/28/2020 CLINICAL DATA:  Acute respiratory failure. EXAM: PORTABLE CHEST 1 VIEW COMPARISON:  Chest radiograph 07/01/2020 FINDINGS: Monitoring leads overlie the patient. Stable cardiac and mediastinal contours. Tortuosity of the thoracic aorta. Bandlike opacity right mid lung and left lower lung. No pleural effusion or pneumothorax. IMPRESSION: Bilateral bandlike opacities may represent atelectasis. Infection not excluded. Electronically Signed   By: Lovey Newcomer M.D.   On: 08/28/2020 13:08    Microbiology: Recent Results (from the past 240 hour(s))  Resp Panel by RT-PCR (Flu A&B, Covid) Nasopharyngeal Swab     Status: None   Collection Time: 08/27/20 12:13 PM   Specimen: Nasopharyngeal Swab; Nasopharyngeal(NP) swabs in vial transport medium  Result Value Ref Range Status   SARS Coronavirus 2 by RT PCR NEGATIVE NEGATIVE Final    Comment: (NOTE) SARS-CoV-2 target nucleic acids are NOT DETECTED.  The SARS-CoV-2 RNA is generally detectable in upper respiratory specimens during the acute phase of infection. The lowest concentration of SARS-CoV-2 viral copies this assay can detect  is 138 copies/mL. A negative result does not preclude SARS-Cov-2 infection and should not be used as the sole basis for treatment or other patient management decisions. A negative result may occur with  improper specimen collection/handling, submission of specimen other than nasopharyngeal swab, presence of viral mutation(s) within the areas targeted by this assay, and inadequate number of viral copies(<138 copies/mL). A negative result must be combined with clinical observations, patient history, and  epidemiological information. The expected result is Negative.  Fact Sheet for Patients:  EntrepreneurPulse.com.au  Fact Sheet for Healthcare Providers:  IncredibleEmployment.be  This test is no t yet approved or cleared by the [place] FDA and  has been authorized for detection and/or diagnosis of SARS-CoV-2 by FDA under an Emergency Use Authorization (EUA). This EUA will remain  in effect (meaning this test can be used) for the duration of the COVID-19 declaration under Section 564(b)(1) of the Act, 21 U.S.C.section 360bbb-3(b)(1), unless the authorization is terminated  or revoked sooner.       Influenza A by PCR NEGATIVE NEGATIVE Final   Influenza B by PCR NEGATIVE NEGATIVE Final    Comment: (NOTE) The Xpert Xpress SARS-CoV-2/FLU/RSV plus assay is intended as an aid in the diagnosis of influenza from Nasopharyngeal swab specimens and should not be used as a sole basis for treatment. Nasal washings and aspirates are unacceptable for Xpert Xpress SARS-CoV-2/FLU/RSV testing.  Fact Sheet for Patients: EntrepreneurPulse.com.au  Fact Sheet for Healthcare Providers: IncredibleEmployment.be  This test is not yet approved or cleared by the [place] FDA and has been authorized for detection and/or diagnosis of SARS-CoV-2 by FDA under an Emergency Use Authorization (EUA). This EUA will remain in effect (meaning this test can be used) for the duration of the COVID-19 declaration under Section 564(b)(1) of the Act, 21 U.S.C. section 360bbb-3(b)(1), unless the authorization is terminated or revoked.  Performed at [facility], [place] [address], [place], [place] [address]   C Difficile Quick Screen w PCR reflex     Status: Abnormal   Collection Time: 08/27/20  1:31 PM   Specimen: STOOL  Result Value Ref Range Status   C Diff antigen POSITIVE (A) NEGATIVE Final   C Diff toxin NEGATIVE  NEGATIVE Final   C Diff interpretation Results are indeterminate. See PCR results.  Final    Comment: Performed at [facility], [place] [address] [place], [place] [address]  Gastrointestinal Panel by PCR , Stool     Status: Abnormal   Collection Time: 08/27/20  1:31 PM   Specimen: Stool  Result Value Ref Range Status   Campylobacter species DETECTED (A) NOT DETECTED Final    Comment: RESULT CALLED TO, READ BACK BY AND VERIFIED WITH: SHANELLE VALENTINE AT [id number] ON 08/28/2020 [place].    Plesimonas shigelloides NOT DETECTED NOT DETECTED Final   Salmonella species NOT DETECTED NOT DETECTED Final   Yersinia enterocolitica NOT DETECTED NOT DETECTED Final   Vibrio species NOT DETECTED NOT DETECTED Final   Vibrio cholerae NOT DETECTED NOT DETECTED Final   Enteroaggregative E coli (EAEC) NOT DETECTED NOT DETECTED Final   Enteropathogenic E coli (EPEC) DETECTED (A) NOT DETECTED Final    Comment: RESULT CALLED TO, READ BACK BY AND VERIFIED WITH: SHANELLE VALENTINE AT [id number] ON 08/28/2020 [place].    Enterotoxigenic E coli (ETEC) DETECTED (A) NOT DETECTED Final    Comment: RESULT CALLED TO, READ BACK BY AND VERIFIED WITH: SHANELLE VALENTINE AT [id number] ON 08/28/2020 [place].    Shiga like toxin  producing E coli (STEC) NOT DETECTED NOT DETECTED Final   Shigella/Enteroinvasive E coli (EIEC) NOT DETECTED NOT DETECTED Final   Cryptosporidium NOT DETECTED NOT DETECTED Final   Cyclospora cayetanensis NOT DETECTED NOT DETECTED Final   Entamoeba histolytica NOT DETECTED NOT DETECTED Final   Giardia lamblia NOT DETECTED NOT DETECTED Final   Adenovirus F40/41 NOT DETECTED NOT DETECTED Final   Astrovirus NOT DETECTED NOT DETECTED Final   Norovirus GI/GII NOT DETECTED NOT DETECTED Final   Rotavirus A NOT DETECTED NOT DETECTED Final   Sapovirus (I, II, IV, and V) NOT DETECTED NOT DETECTED Final    Comment: Performed at [facility], [address] [place], [place] [address]  C. Diff by PCR,  Reflexed     Status: None   Collection Time: 08/27/20  1:31 PM  Result Value Ref Range Status   Toxigenic C. Difficile by PCR NEGATIVE NEGATIVE Final    Comment: Patient is colonized with non toxigenic C. difficile. May not need treatment unless significant symptoms are present. Performed at [facility], [place] [address] [place], [place] [address]   Blood culture (routine x 2)     Status: None (Preliminary result)   Collection Time: 08/27/20  4:40 PM   Specimen: BLOOD  Result Value Ref Range Status   Specimen Description   Final    BLOOD RIGHT ANTECUBITAL Performed at [place] [address], [place], [place] [address]    Special Requests   Final    BOTTLES DRAWN AEROBIC ONLY Blood Culture adequate volume Performed at [place] [address] [place], [place] [address]    Culture   Final    NO GROWTH 3 DAYS Performed at [facility], [place] [address], [place], [place] [address]    Report Status PENDING  Incomplete  Blood culture (routine x 2)     Status: None (Preliminary result)   Collection Time: 08/27/20  4:40 PM   Specimen: BLOOD  Result Value Ref Range Status   Specimen Description   Final    BLOOD LEFT ANTECUBITAL Performed at [place] [address], [place], [place] [address]    Special Requests   Final    BOTTLES DRAWN AEROBIC ONLY Blood Culture adequate volume Performed at [place] [address], [place], [place] [address]    Culture   Final    NO GROWTH 3 DAYS Performed at [facility], [place] [address] [place], [place] [address]    Report Status PENDING  Incomplete  MRSA PCR Screening     Status: None   Collection Time: 08/27/20  6:37 PM   Specimen: Nasopharyngeal  Result Value Ref Range Status   MRSA by PCR NEGATIVE NEGATIVE Final    Comment:        The GeneXpert MRSA Assay (FDA approved for NASAL specimens only), is one component of  a comprehensive MRSA colonization surveillance program. It is not intended to diagnose MRSA infection nor to guide or monitor treatment for MRSA infections. Performed at [facility], [place] [address] [place], [place] [address]      Labs: Basic Metabolic Panel: Recent Labs  Lab 08/27/20 0738 08/28/20 0233 08/29/20 0221 08/30/20 0238  NA 139 137 137 135  K 3.6 3.9 3.6 3.5  CL 112* 110 108 107  CO2 14* 17* 20* 20*  GLUCOSE 88 130* 101* 98  BUN 30* 33* 27* 20  CREATININE 1.07* 1.08*  0.97 0.77  CALCIUM 7.7* 7.7* 8.2* 8.2*   Liver Function Tests: Recent Labs  Lab 08/27/20 0738 08/28/20 0233 08/29/20 0221 08/30/20 0238  AST 28 20 15  14*  ALT 14 12 11 11   ALKPHOS 73 50 51 48  BILITOT 0.8 0.9 0.8 0.6  PROT 5.7* 5.4* 5.2* 5.4*  ALBUMIN 3.2* 2.8* 2.7* 2.8*   No results for input(s): LIPASE, AMYLASE in the last 168 hours. No results for input(s): AMMONIA in the last 168 hours. CBC: Recent Labs  Lab 08/27/20 0738 08/27/20 1825 08/28/20 0233 08/29/20 0221 08/30/20 0238  WBC 5.2 9.3 10.4 8.9 7.7  HGB 11.0* 10.1* 9.4* 8.3* 8.8*  HCT 37.6 32.0* 29.1* 26.4* 27.4*  MCV 95.4 90.4 89.3 90.7 89.0  PLT 212 220 195 178 200   Cardiac Enzymes: No results for input(s): CKTOTAL, CKMB, CKMBINDEX, TROPONINI in the last 168 hours. BNP: BNP (last 3 results) Recent Labs    08/27/20 1825  BNP 320.9*    ProBNP (last 3 results) Recent Labs    02/11/20 1626 02/29/20 1208  PROBNP 2,383* 2,147*    CBG: No results for input(s): GLUCAP in the last 168 hours.     Signed:  Oswald Hillock MD.  [facility] 08/30/2020, 11:03 AM

## 2020-08-30 NOTE — TOC Progression Note (Signed)
Transition of Care [facility]) - Progression Note    Patient Details  Name: MARICSA SAMMONS MRN: [id number] Date of Birth: [birth year]  [facility]) CM/SW Contact  Leeroy Cha, RN Phone Number: 08/30/2020, 8:18 AM  Clinical Narrative:     Assessment/Plan:     1. Colitis-infectious versus inflammatory, slowly improving.  GI was consulted, recommend to continue with antibiotics.  Continue IV Zosyn. 2. Moderate sigmoid diverticulosis with no evidence of diverticulitis-stable. 3. History of GERD-continue PPIs.   4. A. fib with RVR-heart rate is well controlled, continue anticoagulation with apixaban.  Toprol was held due to hypotension yesterday. 5. Hypertension-blood pressure stable, will restart Toprol-XL 25 mg daily.  Continue to hold losartan. 6. CAD s/p DES-stable. 7. RML groundglass opacity-noted on CT, ? aspiration pneumonia   Patient is already on Zosyn as above. She does have elevated Pro calcitonin.  Likely discharge in am. We will start Augmentin po bid for 3 more days.  Following for progression, discharge and toc needs for home.   Expected Discharge Plan: Home/Self Care Barriers to Discharge: No Barriers Identified  Expected Discharge Plan and Services Expected Discharge Plan: Home/Self Care   Discharge Planning Services: CM Consult   Living arrangements for the past 2 months: Single Family Home                                       Social Determinants of Health (SDOH) Interventions    Readmission Risk Interventions No flowsheet data found.

## 2020-09-01 ENCOUNTER — Other Ambulatory Visit: Payer: Self-pay

## 2020-09-01 ENCOUNTER — Telehealth: Payer: Self-pay | Admitting: Internal Medicine

## 2020-09-01 LAB — CULTURE, BLOOD (ROUTINE X 2)
Culture: NO GROWTH
Culture: NO GROWTH
Special Requests: ADEQUATE
Special Requests: ADEQUATE

## 2020-09-01 MED ORDER — ONDANSETRON 4 MG PO TBDP: 4.0000 mg | ORAL_TABLET | Freq: Three times a day (TID) | ORAL | 0 refills | Status: DC | PRN

## 2020-09-01 NOTE — Telephone Encounter (Signed)
Spoke with pt and she is aware. Pt requested refill on ondansetron. Reports what she had was from June and she is out. Refill sent to pharmacy. Dr. Henrene Pastor aware.

## 2020-09-01 NOTE — Telephone Encounter (Signed)
Pt states Dr. Henrene Pastor saw her when she was in the hospital for colitis last week. She is home but reports she is still having loose stools. Yesterday she had 2 "soft" stools. This am she had a soft stool for the 1st one but the 2nd one was diarrhea. She is taking levsin and it is helping with the cramping. Pt wants to  Know what she should take for soft/loose stools. Please advise.

## 2020-09-01 NOTE — Telephone Encounter (Signed)
Pt is requesting a call back from a nurse to discuss her diarrhea and stomach cramps.

## 2020-09-01 NOTE — Telephone Encounter (Signed)
This is very common and nothing worrisome.  It may take some time, but that should improve.  Thanks for checking

## 2020-09-01 NOTE — Telephone Encounter (Signed)
Thank you :)

## 2020-09-19 ENCOUNTER — Ambulatory Visit: Payer: Medicare Other | Admitting: Sports Medicine

## 2020-09-19 ENCOUNTER — Other Ambulatory Visit: Payer: Self-pay

## 2020-09-19 VITALS — BP 143/87 | Ht 63.0 in | Wt 138.0 lb

## 2020-09-19 DIAGNOSIS — M25551 Pain in right hip: Secondary | ICD-10-CM | POA: Diagnosis not present

## 2020-09-19 DIAGNOSIS — M25552 Pain in left hip: Secondary | ICD-10-CM

## 2020-09-19 NOTE — Patient Instructions (Addendum)
WE need to do some hip exercises They must be easy so as not to affect your heart failure Let's do them in a chair or seated on couch  1.  Take hip to the side 2. Rotate hip in and out 3. Lift hip up a few inches off couch 4. Squeeze a pillow between your knees  Start with no more than 10 repeats of each exercise Then if not tired later in the day do this a second time Stop at less than 10 if tired  Tylenol is good for pain  If you are able to do the above exercises fairly easily, then add your lying hip abductor exercise doing no more than 10 repeats

## 2020-09-19 NOTE — Progress Notes (Signed)
    SUBJECTIVE:   CHIEF COMPLAINT / HPI: bilateral hip pain   Hailey [name] is a 86 year old female presenting for evaluation of bilateral hip pain.  She has a known history of bilateral hip pain felt to be secondary to greater trochanteric pain syndrome which she is followed with Dr. Oneida Alar for in the past, last in 02/2020.  She was given gentle home exercises with improvement after this.  She feels that this is the same pain that recently flared up a few weeks ago after being hospitalized for 4 days due to acute colitis. R Worse than L.  She reports she stayed in the bed for that time and got really stiff, she also feels like the cold weather has made it more painful.  She has been using Voltaren gel, CBD cream, and Tylenol with relief.  Has not tried any of her exercises at home yet, not interested in formal PT after subsequent worsening previously.  She denies any associated weakness, numbness/tingling, hip instability.  She does have chronic low back pain that also has been bothering her more recently without any saddle anesthesia, bowel/bladder troubles.   PERTINENT  PMH / PSH: Proximal A. fib, hypertension, CAD, chronic back pain  OBJECTIVE:   BP (!) 143/87   Ht 5\' 3"  (1.6 m)   Wt 138 lb (62.6 kg)   BMI 24.45 kg/m   General: Alert, NAD HEENT: NCAT, MMM Lungs: No increased WOB  Ext: Warm, dry, 2+ distal pulses . Left Hip:  - Inspection: No gross deformity, no swelling, erythema, or ecchymosis. - Palpation: TTP over greater trochanter - ROM: Normal range of motion on Flexion, extension, abduction, internal and external rotation - Strength: Normal strength, with exception of 4/5 abductor strength - Neuro/vasc: NV intact distally - Special Tests: Negative FABER and FADIR.    Right Hip:  - Inspection: No gross deformity, no swelling, erythema, or ecchymosis - Palpation: Intermittent tenderness along superior portion of IT band, no tenderness over greater trochanter - ROM: Normal range  of motion on Flexion, extension, abduction, internal and external rotation - Strength: Normal strength, with exception of 4/5 abductor strength - Neuro/vasc: NV intact distally - Special Tests: Negative FABER and FADIR.   ASSESSMENT/PLAN:   Greater trochanteric pain syndrome: Recurrent and acute, bilateral.  Clinical presentation consistent with previous, likely exacerbated again after prolonged laying in bed with recent hospitalization and minimal activity recently.  Will trial simple home exercises as previously provided by Dr. Oneida Alar as she was successful with these in the past in addition to abductor strengthening exercises, demonstrated in the office.  May continue Voltaren/CBD cream.  Follow-up in approximately 4 to 6 weeks, sooner if worsening.  Hailey [name], [place]   Patient seen and evaluated with the resident.  I agree with the above plan of care.  We will have the patient resume the same exercises that Dr. Oneida Alar gave her back in June.  These are some easy hip strengthening exercises.  Follow-up with [place] again in 6 weeks.  In the meantime, continue with Voltaren gel and CBD cream as the seem to be working well for her.

## 2020-09-20 ENCOUNTER — Encounter: Payer: Self-pay | Admitting: Sports Medicine

## 2020-09-20 DIAGNOSIS — D0439 Carcinoma in situ of skin of other parts of face: Secondary | ICD-10-CM | POA: Diagnosis not present

## 2020-09-21 ENCOUNTER — Ambulatory Visit: Payer: Medicare Other | Admitting: Plastic Surgery

## 2020-09-22 ENCOUNTER — Other Ambulatory Visit: Payer: Self-pay

## 2020-09-22 ENCOUNTER — Encounter: Payer: Self-pay | Admitting: Plastic Surgery

## 2020-09-22 ENCOUNTER — Ambulatory Visit: Payer: Medicare Other | Admitting: Plastic Surgery

## 2020-09-22 VITALS — Temp 97.1°F

## 2020-09-22 DIAGNOSIS — L905 Scar conditions and fibrosis of skin: Secondary | ICD-10-CM

## 2020-09-22 NOTE — Progress Notes (Signed)
Patient presents after Mohs excision of a lesion on her left cheek.  It turns out the area for closure ended up being pretty small so a complex repair was done by Dr. Winifred Olive.  This looks to be healing nicely and she is doing well from that standpoint.  From the scar revision on her forehead she still bothered a bit by the appearance of that and it looks like the skin did invert again slightly despite my efforts to evert it with mattress sutures.  I do think it is an improvement but not as much as I would have hoped.  I have asked her to continue to watch this area and I expect it will continue to smooth out a bit with time but we could always do a dermabrasion on it which I think would help it.  She will call us in the future with any concerns that she is has or if she would like to pursue that.

## 2020-09-26 ENCOUNTER — Ambulatory Visit: Payer: Medicare Other | Admitting: Internal Medicine

## 2020-10-09 DIAGNOSIS — Z20822 Contact with and (suspected) exposure to covid-19: Secondary | ICD-10-CM | POA: Diagnosis not present

## 2020-11-03 ENCOUNTER — Other Ambulatory Visit: Payer: Self-pay

## 2020-11-03 ENCOUNTER — Ambulatory Visit: Payer: Medicare Other | Admitting: Sports Medicine

## 2020-11-03 VITALS — BP 157/91 | Ht 63.0 in | Wt 138.0 lb

## 2020-11-03 DIAGNOSIS — M25551 Pain in right hip: Secondary | ICD-10-CM | POA: Diagnosis not present

## 2020-11-03 MED ORDER — METHYLPREDNISOLONE ACETATE 40 MG/ML IJ SUSP
40.0000 mg | Freq: Once | INTRAMUSCULAR | Status: AC
  Administered 2020-11-03: 40 mg via INTRA_ARTICULAR

## 2020-11-03 NOTE — Progress Notes (Signed)
   Subjective:    Patient ID: Hailey [name], female    DOB: 01-Jan-1935, 86 y.o.   MRN: [id number]  HPI   Hailey [name] comes in today for follow-up on bilateral hip pain secondary to greater trochanteric pain syndrome.  Unfortunately, she continues to have significant pain along the lateral hip, especially the right hip.  She recently returned from a 3-week trip to [place] which exacerbated her symptoms.  She did feel like she was beginning to improve prior to this trip.  She has been diligent with her home exercises.    Review of Systems As above    Objective:   Physical Exam  Well-developed, well-nourished.  No acute distress  Examination of both hips show smooth painless hip range of motion with a negative logroll.  She is tender to palpation over the right greater trochanteric bursa, minimally tender over the left greater trochanteric bursa.  She does still have hip abductor weakness.  Neurovascularly intact distally.      Assessment & Plan:   Persistent bilateral hip pain, right greater than left, secondary to greater trochanteric pain syndrome  Patient's right greater trochanteric area was injected with cortisone today.  This was tolerated without difficulty.  Patient understands the importance of continuing with her home exercises.  If her left hip pain worsens then we could consider injection there as well.  She will follow-up for ongoing or recalcitrant issues.  Consent obtained and verified. Time-out conducted. Noted no overlying erythema, induration, or other signs of local infection. Skin prepped in a sterile fashion. Topical analgesic spray: Ethyl chloride. Joint: right hip, greater trochanter Needle: 25g 1.5 inch Completed without difficulty. Meds: 3cc 1% xylocaine, 1cc (40mg ) depomedrol  Advised to call if fevers/chills, erythema, induration, drainage, or persistent bleeding.

## 2020-11-04 ENCOUNTER — Encounter: Payer: Self-pay | Admitting: Internal Medicine

## 2020-11-04 ENCOUNTER — Other Ambulatory Visit: Payer: Self-pay

## 2020-11-04 ENCOUNTER — Ambulatory Visit: Payer: Medicare Other | Admitting: Internal Medicine

## 2020-11-04 VITALS — BP 120/76 | HR 74 | Ht 63.0 in | Wt 139.8 lb

## 2020-11-04 DIAGNOSIS — K5730 Diverticulosis of large intestine without perforation or abscess without bleeding: Secondary | ICD-10-CM | POA: Diagnosis not present

## 2020-11-04 DIAGNOSIS — K219 Gastro-esophageal reflux disease without esophagitis: Secondary | ICD-10-CM | POA: Diagnosis not present

## 2020-11-04 DIAGNOSIS — K589 Irritable bowel syndrome without diarrhea: Secondary | ICD-10-CM | POA: Diagnosis not present

## 2020-11-04 DIAGNOSIS — A049 Bacterial intestinal infection, unspecified: Secondary | ICD-10-CM | POA: Diagnosis not present

## 2020-11-04 NOTE — Patient Instructions (Signed)
Please follow up as needed 

## 2020-11-04 NOTE — Progress Notes (Signed)
HISTORY OF PRESENT ILLNESS:  Hailey [name] is a 86 y.o. female who was admitted to the hospital 08/27/2020 with acute bacterial colitis secondary to E. coli species and Campylobacter. In addition to antibiotics, she was treated with supportive measures. She improved over the course of several days and was discharged on 08/30/2020. I saw her in the hospital. Her discharge creatinine was 0.77. Discharge hemoglobin 8.8 and white blood cell count 7.7. Follow-up at this time range. Patient's please report that she felt back to her baseline within a few weeks. She has since traveled to [place] would like to vacation with her husband. She has had no recurrent GI issues or symptoms since her hospitalization. She has had previous colonoscopy elsewhere 2013 which revealed extensive diverticulosis in the left colon but no neoplasia. She does have a history of irritable bowel syndrome and GERD. Currently doing well from that perspective.  REVIEW OF SYSTEMS:  All non-GI ROS negative unless otherwise stated in the HPI except for anxiety, fatigue, arthritis, back pain, hip pain, visual change, hearing problems, irregular heartbeat, lower extremity swelling, sleeping problems, shortness of breath  Past Medical History:  Diagnosis Date  . Acute blood loss anemia 12/25/2017  . Allergic rhinitis   . Anxiety   . Arthritis    "my whole spine" (07/01/2017)  . Atrial fibrillation ([place])   . Bowel obstruction ([place])    in [place]  . Bradycardia with 41-50 beats per minute 12/27/2017  . Cancer ([place])   . Cellulitis of leg, right with large prepatella hematoma and open wounds 12/26/2017  . CKD (chronic kidney disease) stage 2, GFR 60-89 ml/min   . Colon polyps   . Coronary artery disease    10/18 PCI/DES to p/m LCx with cutting balloon to mLcx  . Diverticulosis of colon   . GERD (gastroesophageal reflux disease)   . Hip bursitis 2010   Dr Para March, Post op seroma  . History of colon polyps   . HSV (herpes simplex virus)  anogenital infection 07/2019  . HTN (hypertension)   . IBS (irritable bowel syndrome)    constipation predominant - Dr Earlean Shawl  . Lichen sclerosus   . Osteopenia 11/2016   T score -2.0 FRAX 15%/4.3%  . PAC (premature atrial contraction)    Symptomatiic  . Renal insufficiency   . Right bundle branch block (RBBB) on electrocardiogram (ECG) 12/27/2017  . Scoliosis   . SVT (supraventricular tachycardia) ([place])    brief history    Past Surgical History:  Procedure Laterality Date  . ANTERIOR AND POSTERIOR VAGINAL REPAIR  01/2002   Archie Endo 01/23/2011  . APPENDECTOMY  1948  . CARDIAC CATHETERIZATION  06/26/2017  . CORONARY ANGIOPLASTY WITH STENT PLACEMENT  07/01/2017  . CORONARY ATHERECTOMY N/A 07/01/2017   Procedure: CORONARY ATHERECTOMY;  Surgeon: Belva Crome, MD;  Location: [facility];  Service: Cardiovascular;  Laterality: N/A;  . CORONARY STENT INTERVENTION N/A 07/01/2017   Procedure: CORONARY STENT INTERVENTION;  Surgeon: Belva Crome, MD;  Location: [facility];  Service: Cardiovascular;  Laterality: N/A;  . HAMMER TOE SURGERY    . HEMORRHOID BANDING    . HIP SURGERY Left 04/2009   hip examination under anesthesia followed by greater trochanteric bursectomy; iliotibial band tenotomy/notes 01/20/2011  . I & D EXTREMITY Right 01/10/2018   Procedure: IRRIGATION AND DEBRIDEMENT RIGHT KNEE, APPLY WOUND VAC;  Surgeon: Newt Minion, MD;  Location: [facility];  Service: Orthopedics;  Laterality: Right;  . KNEE BURSECTOMY Right 04/2009   /  notes 01/09/2011  . LEFT HEART CATH AND CORONARY ANGIOGRAPHY N/A 06/26/2017   Procedure: LEFT HEART CATH AND CORONARY ANGIOGRAPHY;  Surgeon: Belva Crome, MD;  Location: [facility];  Service: Cardiovascular;  Laterality: N/A;  . PUBOVAGINAL SLING  01/2002   Archie Endo 01/23/2011  . REDUCTION MAMMAPLASTY    . TEMPORARY PACEMAKER N/A 07/01/2017   Procedure: TEMPORARY PACEMAKER;  Surgeon: Belva Crome, MD;  Location: [facility];   Service: Cardiovascular;  Laterality: N/A;  . VAGINAL HYSTERECTOMY  01/2002   Vaginal hysterectomy, bilateral salpingo-oophorectomy/notes 01/23/2011    Social History [name]  reports that she quit smoking about 41 years ago. Her smoking use included cigarettes. She has a 7.00 pack-year smoking history. She has never used smokeless tobacco. She reports current alcohol use of about 7.0 standard drinks of alcohol per week. She reports that she does not use drugs.  family history includes Colon cancer in her mother; Diabetes in her father; Pancreatic cancer in her brother; Prostate cancer in her brother and father; Stomach cancer in her son.  Allergies  Allergen Reactions  . Macrobid [employer] Macro] Other (See Comments)    Nausea, stomach cramps, fatigue , headache.  . Meloxicam Other (See Comments)    Jittery and headache  . Digoxin And Related     headaches  . Mobic [Meloxicam]   . Sulfamethoxazole-Trimethoprim Nausea Only       PHYSICAL EXAMINATION: Vital signs: BP 120/76   Pulse 74   Ht 5\' 3"  (1.6 m)   Wt 139 lb 12.8 oz (63.4 kg)   BMI 24.76 kg/m   Constitutional: generally well-appearing, no acute distress Psychiatric: alert and oriented x3, cooperative Eyes: extraocular movements intact, anicteric, conjunctiva pink Mouth: oral pharynx moist, no lesions Neck: supple no lymphadenopathy Cardiovascular: heart regular rate and rhythm, no murmur Lungs: clear to auscultation bilaterally Abdomen: soft, nontender, nondistended, no obvious ascites, no peritoneal signs, normal bowel sounds, no organomegaly Rectal: Omitted Extremities: no clubbing or cyanosis. 1+ lower extremity edema bilaterally Skin: no lesions on visible extremities Neuro: No focal deficits. Cranial nerves intact  ASSESSMENT:  1. Acute bacterial colitis secondary to E. coli species and Campylobacter. She has fully recovered. 2. History of IBS. Stable 3. GERD. Controlled on therapy  (Nexium) 4. Diverticulosis with history of diverticulitis 5. Last colonoscopy 2013 (Dr. Earlean Shawl) with left-sided diverticulosis   PLAN:  1. Regular fiber supplementation 2. Antispasmodics as needed 3. Continue PPI 4. Reflux regimen 5. Ongoing general medical care with Dr. Alain Marion 6. GI follow-up as needed

## 2020-11-07 DIAGNOSIS — L923 Foreign body granuloma of the skin and subcutaneous tissue: Secondary | ICD-10-CM | POA: Diagnosis not present

## 2020-11-08 ENCOUNTER — Ambulatory Visit (INDEPENDENT_AMBULATORY_CARE_PROVIDER_SITE_OTHER): Payer: Medicare Other

## 2020-11-08 ENCOUNTER — Other Ambulatory Visit: Payer: Self-pay | Admitting: Obstetrics and Gynecology

## 2020-11-08 ENCOUNTER — Other Ambulatory Visit: Payer: Self-pay

## 2020-11-08 DIAGNOSIS — Z780 Asymptomatic menopausal state: Secondary | ICD-10-CM

## 2020-11-08 DIAGNOSIS — M8589 Other specified disorders of bone density and structure, multiple sites: Secondary | ICD-10-CM

## 2020-11-09 ENCOUNTER — Other Ambulatory Visit: Payer: Self-pay | Admitting: Internal Medicine

## 2020-11-11 NOTE — Telephone Encounter (Signed)
Per office refill protocol med refills can take up to 24-72 hours for renewal. Refill received on 11/09/20 and is waiting on MD for approval status.Marland KitchenJohny Chess

## 2020-11-11 NOTE — Telephone Encounter (Signed)
Patient calling upset because it hasnt been refilled and she is out of medication

## 2020-11-14 DIAGNOSIS — H25811 Combined forms of age-related cataract, right eye: Secondary | ICD-10-CM | POA: Diagnosis not present

## 2020-11-14 DIAGNOSIS — H2511 Age-related nuclear cataract, right eye: Secondary | ICD-10-CM | POA: Diagnosis not present

## 2020-11-15 DIAGNOSIS — H2512 Age-related nuclear cataract, left eye: Secondary | ICD-10-CM | POA: Diagnosis not present

## 2020-11-18 DIAGNOSIS — L98491 Non-pressure chronic ulcer of skin of other sites limited to breakdown of skin: Secondary | ICD-10-CM | POA: Diagnosis not present

## 2020-11-24 ENCOUNTER — Other Ambulatory Visit: Payer: Self-pay | Admitting: Internal Medicine

## 2020-11-24 DIAGNOSIS — Z1231 Encounter for screening mammogram for malignant neoplasm of breast: Secondary | ICD-10-CM

## 2020-11-28 DIAGNOSIS — H2512 Age-related nuclear cataract, left eye: Secondary | ICD-10-CM | POA: Diagnosis not present

## 2020-12-21 ENCOUNTER — Other Ambulatory Visit: Payer: Self-pay

## 2020-12-21 ENCOUNTER — Encounter: Payer: Self-pay | Admitting: Nurse Practitioner

## 2020-12-21 ENCOUNTER — Ambulatory Visit: Payer: Medicare Other | Admitting: Nurse Practitioner

## 2020-12-21 VITALS — BP 118/74

## 2020-12-21 DIAGNOSIS — N763 Subacute and chronic vulvitis: Secondary | ICD-10-CM | POA: Diagnosis not present

## 2020-12-21 MED ORDER — CLOBETASOL PROPIONATE 0.05 % EX OINT
1.0000 | TOPICAL_OINTMENT | Freq: Two times a day (BID) | CUTANEOUS | 0 refills | Status: DC
Start: 2020-12-21 — End: 2021-05-18

## 2020-12-21 MED ORDER — LIDOCAINE 5 % EX OINT: 1.0000 "application " | TOPICAL_OINTMENT | CUTANEOUS | 0 refills | Status: DC | PRN

## 2020-12-21 NOTE — Progress Notes (Signed)
   Acute Office Visit  Subjective:    Patient ID: Hailey [name], female    DOB: [birth year], 86 y.o.   MRN: [id number]   HPI 86 y.o. presents today for vaginal irritation/burning. History of chronic vulvitis. Negative vulvar biopsy 07/2020 with recommendations to use clobetasol ointment indefinitely for management. She was using most days but stopped to see how it would do and the pain has worsened since stopping. Denies discharge or odor.    Review of Systems  Constitutional: Negative.   Genitourinary: Positive for vaginal pain. Negative for vaginal bleeding and vaginal discharge.       Objective:    Physical Exam Constitutional:      Appearance: Normal appearance.  Genitourinary:      BP 118/74  Wt Readings from Last 3 Encounters:  11/04/20 139 lb 12.8 oz (63.4 kg)  11/03/20 138 lb (62.6 kg)  09/19/20 138 lb (62.6 kg)        Assessment & Plan:   Problem List Items Addressed This Visit   None   Visit Diagnoses    Chronic vulvitis    -  Primary   Relevant Medications   clobetasol ointment (TEMOVATE) 0.05 %   lidocaine (XYLOCAINE) 5 % ointment     Plan: Reassurance provided that area has improved since last visit in November 2021 when biopsy was obtained. We discussed biopsy results again and plan of care for managing chronic inflammation. She will restart Clobetasol and use Lidocaine 5% ointment as needed for pain. She plans to return for annual exam in 3-4 weeks and we will reassess then. She is agreeable to plan.      Tamela Gammon DNP, 3:23 PM 12/21/2020

## 2020-12-23 ENCOUNTER — Ambulatory Visit: Payer: Medicare Other

## 2020-12-25 ENCOUNTER — Other Ambulatory Visit: Payer: Self-pay | Admitting: Internal Medicine

## 2020-12-26 ENCOUNTER — Other Ambulatory Visit: Payer: Self-pay | Admitting: *Deleted

## 2020-12-26 ENCOUNTER — Other Ambulatory Visit: Payer: Self-pay

## 2020-12-26 ENCOUNTER — Other Ambulatory Visit: Payer: Medicare Other

## 2020-12-26 DIAGNOSIS — Z01812 Encounter for preprocedural laboratory examination: Secondary | ICD-10-CM

## 2020-12-27 LAB — BASIC METABOLIC PANEL
BUN/Creatinine Ratio: 17 (ref 12–28)
BUN: 20 mg/dL (ref 8–27)
CO2: 19 mmol/L — ABNORMAL LOW (ref 20–29)
Calcium: 9 mg/dL (ref 8.7–10.3)
Chloride: 102 mmol/L (ref 96–106)
Creatinine, Ser: 1.17 mg/dL — ABNORMAL HIGH (ref 0.57–1.00)
Glucose: 102 mg/dL — ABNORMAL HIGH (ref 65–99)
Potassium: 4.8 mmol/L (ref 3.5–5.2)
Sodium: 137 mmol/L (ref 134–144)
eGFR: 46 mL/min/{1.73_m2} — ABNORMAL LOW (ref >59)

## 2020-12-28 ENCOUNTER — Ambulatory Visit: Payer: Medicare Other | Admitting: Nurse Practitioner

## 2020-12-29 ENCOUNTER — Ambulatory Visit: Payer: Medicare Other

## 2020-12-30 ENCOUNTER — Other Ambulatory Visit: Payer: Self-pay

## 2020-12-30 ENCOUNTER — Ambulatory Visit (INDEPENDENT_AMBULATORY_CARE_PROVIDER_SITE_OTHER)
Admission: RE | Admit: 2020-12-30 | Discharge: 2020-12-30 | Disposition: A | Discharge status: Home / Self Care | Payer: Medicare Other | Source: Ambulatory Visit | Attending: Interventional Cardiology | Admitting: Interventional Cardiology

## 2020-12-30 DIAGNOSIS — I712 Thoracic aortic aneurysm, without rupture, unspecified: Secondary | ICD-10-CM

## 2020-12-30 MED ORDER — IOHEXOL 350 MG/ML SOLN
80.0000 mL | Freq: Once | INTRAVENOUS | Status: AC | PRN
  Administered 2020-12-30: 80 mL via INTRAVENOUS

## 2020-12-31 ENCOUNTER — Other Ambulatory Visit: Payer: Self-pay | Admitting: Internal Medicine

## 2021-01-02 ENCOUNTER — Telehealth: Payer: Self-pay | Admitting: Internal Medicine

## 2021-01-02 NOTE — Telephone Encounter (Signed)
Caller states she is having some abdominal issues. She is constipated and just doesn't feel well. Symptoms present for almost a week with the constipation. This was after having a bout of diarrhea. Taking colace, miralax but no major improvement. She is having pain in lower abd area, left side. Pain with movement.   Advised to see PCP within 4 hours.    Due to glitch during triage, intitial dispo read ED now or PCP triage but after getting out of screen and back to the dispo screen, it had updated and changed to see HCP in 4 hours. Patient did not want to be seen at ED or UC, wants to be seen in PCP office. Staff at office advised someone would give patient a call back once they verify what is available.

## 2021-01-02 NOTE — Telephone Encounter (Signed)
[facility] nurse called over and stated they advised patient to go to ED or UC now. Patient has refused.  Patients wants to be seen in office.  Patient has been stopped up for some time and has stomach pain.  Please follow up with patient.

## 2021-01-02 NOTE — Telephone Encounter (Signed)
Agree w/OV Thx 

## 2021-01-02 NOTE — Telephone Encounter (Signed)
Patient is requesting a refill of the following medications: Requested Prescriptions   Pending Prescriptions Disp Refills  . DULoxetine (CYMBALTA) 60 MG capsule [Pharmacy Med Name: duloxetine 60 mg capsule,delayed release] 90 capsule 1    Sig: TAKE 1 CAPSULE DAILY.    Date of patient request: 01/02/21  Last office visit: 07/07/21  Date of last refill: 06/15/20  Last refill amount: 90  Follow up time period per chart: 01/10/21

## 2021-01-03 ENCOUNTER — Ambulatory Visit (INDEPENDENT_AMBULATORY_CARE_PROVIDER_SITE_OTHER): Payer: Medicare Other | Admitting: Internal Medicine

## 2021-01-03 ENCOUNTER — Encounter: Payer: Self-pay | Admitting: Internal Medicine

## 2021-01-03 ENCOUNTER — Other Ambulatory Visit: Payer: Self-pay

## 2021-01-03 VITALS — BP 138/90 | HR 78 | Temp 98.1°F | Ht 63.0 in | Wt 141.4 lb

## 2021-01-03 DIAGNOSIS — K5792 Diverticulitis of intestine, part unspecified, without perforation or abscess without bleeding: Secondary | ICD-10-CM

## 2021-01-03 DIAGNOSIS — I480 Paroxysmal atrial fibrillation: Secondary | ICD-10-CM | POA: Diagnosis not present

## 2021-01-03 DIAGNOSIS — A09 Infectious gastroenteritis and colitis, unspecified: Secondary | ICD-10-CM

## 2021-01-03 MED ORDER — CEFUROXIME AXETIL 500 MG PO TABS: 500.0000 mg | ORAL_TABLET | Freq: Two times a day (BID) | ORAL | 0 refills | Status: AC

## 2021-01-03 MED ORDER — METRONIDAZOLE 500 MG PO TABS
500.0000 mg | ORAL_TABLET | Freq: Three times a day (TID) | ORAL | 0 refills | Status: DC
Start: 2021-01-03 — End: 2021-01-16

## 2021-01-03 MED ORDER — CEFTRIAXONE SODIUM 500 MG IJ SOLR
500.0000 mg | Freq: Once | INTRAMUSCULAR | Status: AC
  Administered 2021-01-03: 500 mg via INTRAMUSCULAR

## 2021-01-03 MED ORDER — ACETAMINOPHEN-CODEINE #2 300-15 MG PO TABS: 1.0000 | ORAL_TABLET | Freq: Four times a day (QID) | ORAL | 0 refills | Status: DC | PRN

## 2021-01-03 NOTE — Progress Notes (Signed)
Subjective:  Patient ID: Hailey [name], female    DOB: 1934-11-30  Age: 86 y.o. MRN: [id number]  CC: Abdominal Pain (X's 1 week) and Constipation   HPI IVETTE CASTRONOVA presents for diarrhea 1 stool once 1 week ago. C/o pain in the L side x 1 week. Pain is severe at time. Pain is mild now. Hard little BMs. No fever F/u A fib  Outpatient Medications Prior to Visit  Medication Sig Dispense Refill  . acetaminophen (TYLENOL) 500 MG tablet Take 1,000 mg by mouth every 6 (six) hours as needed (body aches / sore hip).    Marland Kitchen atorvastatin (LIPITOR) 80 MG tablet TAKE 1 TABLET DAILY AT 6PM. (Patient taking differently: Take 80 mg by mouth daily.) 90 tablet 2  . cetirizine (ZYRTEC) 10 MG tablet Take 10 mg by mouth daily as needed for allergies.    . clobetasol ointment (TEMOVATE) 5.42 % Apply 1 application topically 2 (two) times daily. 30 g 0  . DULoxetine (CYMBALTA) 60 MG capsule TAKE 1 CAPSULE DAILY. (Patient taking differently: Take 60 mg by mouth daily.) 90 capsule 1  . ELIQUIS 2.5 MG TABS tablet TAKE 1 TABLET BY MOUTH TWICE DAILY. (Patient taking differently: Take 2.5 mg by mouth 2 (two) times daily.) 180 tablet 1  . esomeprazole (NEXIUM) 40 MG capsule TAKE (1) CAPSULE DAILY. 90 capsule 1  . hyoscyamine (LEVSIN) 0.125 MG tablet TAKE 1-2 TABLETS (0.125-0.25 MG) BY MOUTH EVERY 4 HOURS AS NEEDED FOR UP TO 10 DAYS FOR CRAMPING. (Patient taking differently: Take 0.125-0.25 mg by mouth every 4 (four) hours as needed for cramping.) 100 tablet 1  . LORazepam (ATIVAN) 1 MG tablet TAKE 1 TABLET TWICE DAILY AS NEEDED FOR ANXIETY/SLEEP. 180 tablet 1  . losartan (COZAAR) 25 MG tablet Take 1 tablet (25 mg total) by mouth daily. 90 tablet 3  . metoprolol succinate (TOPROL XL) 25 MG 24 hr tablet Take 1 tablet (25 mg total) by mouth daily. 90 tablet 3  . nitroGLYCERIN (NITROSTAT) 0.4 MG SL tablet Place 0.4 mg under the tongue every 5 (five) minutes as needed for chest pain (Call 911 at 3rd dose within 15  minutes.).    Marland Kitchen ondansetron (ZOFRAN ODT) 4 MG disintegrating tablet Take 1 tablet (4 mg total) by mouth every 8 (eight) hours as needed for nausea or vomiting. 20 tablet 0  . saccharomyces boulardii (FLORASTOR) 250 MG capsule Take 1 capsule (250 mg total) by mouth 2 (two) times daily. 60 capsule 2  . sodium chloride (OCEAN) 0.65 % SOLN nasal spray Place 1 spray into both nostrils as needed for congestion.    Marland Kitchen amoxicillin-clavulanate (AUGMENTIN) 875-125 MG tablet Take 1 tablet by mouth every 12 (twelve) hours. (Patient not taking: No sig reported) 4 tablet 0  . lidocaine (XYLOCAINE) 5 % ointment Apply 1 application topically as needed. (Patient not taking: Reported on 01/03/2021) 35.44 g 0   No facility-administered medications prior to visit.    ROS: Review of Systems  Constitutional: Negative for activity change, appetite change, chills, fatigue and unexpected weight change.  HENT: Negative for congestion, mouth sores and sinus pressure.   Eyes: Negative for visual disturbance.  Respiratory: Negative for cough and chest tightness.   Gastrointestinal: Positive for abdominal pain and diarrhea. Negative for nausea.  Genitourinary: Negative for difficulty urinating, frequency and vaginal pain.  Musculoskeletal: Negative for back pain and gait problem.  Skin: Negative for pallor and rash.  Neurological: Negative for dizziness, tremors, weakness, numbness and headaches.  Hematological:  Bruises/bleeds easily.  Psychiatric/Behavioral: Negative for confusion and sleep disturbance.    Objective:  BP 138/90 (BP Location: Left Arm)   Pulse 78   Temp 98.1 F (36.7 C) (Oral)   Ht 5\' 3"  (1.6 m)   Wt 141 lb 6.4 oz (64.1 kg)   SpO2 99%   BMI 25.05 kg/m   BP Readings from Last 3 Encounters:  01/03/21 138/90  12/21/20 118/74  11/04/20 120/76    Wt Readings from Last 3 Encounters:  01/03/21 141 lb 6.4 oz (64.1 kg)  11/04/20 139 lb 12.8 oz (63.4 kg)  11/03/20 138 lb (62.6 kg)    Physical  Exam Constitutional:      General: She is not in acute distress.    Appearance: She is well-developed. She is obese.  HENT:     Head: Normocephalic.     Right Ear: External ear normal.     Left Ear: External ear normal.     Nose: Nose normal.  Eyes:     General:        Right eye: No discharge.        Left eye: No discharge.     Conjunctiva/sclera: Conjunctivae normal.     Pupils: Pupils are equal, round, and reactive to light.  Neck:     Thyroid: No thyromegaly.     Vascular: No JVD.     Trachea: No tracheal deviation.  Cardiovascular:     Rate and Rhythm: Normal rate and regular rhythm.     Heart sounds: Normal heart sounds.  Pulmonary:     Effort: No respiratory distress.     Breath sounds: No stridor. No wheezing.  Abdominal:     General: Bowel sounds are normal. There is no distension.     Palpations: Abdomen is soft. There is no mass.     Tenderness: There is no abdominal tenderness. There is no guarding or rebound.  Musculoskeletal:        General: No tenderness.     Cervical back: Normal range of motion and neck supple.  Lymphadenopathy:     Cervical: No cervical adenopathy.  Skin:    Findings: No erythema or rash.  Neurological:     Cranial Nerves: No cranial nerve deficit.     Motor: No abnormal muscle tone.     Coordination: Coordination normal.     Deep Tendon Reflexes: Reflexes normal.  Psychiatric:        Behavior: Behavior normal.        Thought Content: Thought content normal.        Judgment: Judgment normal.     Lab Results  Component Value Date   WBC 7.7 08/30/2020   HGB 8.8 (L) 08/30/2020   HCT 27.4 (L) 08/30/2020   PLT 200 08/30/2020   GLUCOSE 102 (H) 12/26/2020   CHOL 171 11/26/2016   TRIG 99 11/26/2016   HDL 90 11/26/2016   LDLDIRECT 89.1 06/29/2013   LDLCALC 61 11/26/2016   ALT 11 08/30/2020   AST 14 (L) 08/30/2020   NA 137 12/26/2020   K 4.8 12/26/2020   CL 102 12/26/2020   CREATININE 1.17 (H) 12/26/2020   BUN 20 12/26/2020    CO2 19 (L) 12/26/2020   TSH 2.11 04/29/2015   INR 1.1 12/02/2019   HGBA1C 5.6 07/04/2014    CT ANGIO CHEST AORTA W/CM & OR WO/CM  Result Date: 12/31/2020 CLINICAL DATA:  Thoracic aortic aneurysm, follow-up. EXAM: CT ANGIOGRAPHY CHEST WITH CONTRAST TECHNIQUE: Multidetector CT imaging of the chest  was performed using the standard protocol during bolus administration of intravenous contrast. Multiplanar CT image reconstructions and MIPs were obtained to evaluate the vascular anatomy. CONTRAST:  48mL OMNIPAQUE IOHEXOL 350 MG/ML SOLN COMPARISON:  07/01/2020 and previous FINDINGS: Cardiovascular: Heart size upper limits normal. No pericardial effusion. Fair contrast of the pulmonary arterial tree; the exam was not optimized for detection of pulmonary emboli. 3-vessel coronary arterial calcifications. Good contrast opacification of the thoracic aorta without dissection or stenosis. Aortic Root: --Valve: 2.4 cm --Sinuses: 3.5 cm --Sinotubular Junction: 3.2 cm Limitations by motion: Moderate Thoracic Aorta: --Ascending Aorta: 4.4 cm (previously 4.5) --Aortic Arch: 3.5 cm --Descending Aorta: 2.8 Classic 3 vessel brachiocephalic arterial origin anatomy without proximal stenosis. Mild partially calcified plaque in the descending thoracic segment and visualized proximal abdominal aorta. Mediastinum/Nodes: No mass or adenopathy. Lungs/Pleura: No pleural effusion. No pneumothorax. 5 mm intrapulmonary lymph node adjacent to minor fissure stable since 07/03/2014 . Lungs are otherwise clear. Upper Abdomen: No acute findings. Musculoskeletal: T10 benign vertebral hemangioma with stable mild compression deformity. Anterior vertebral endplate spurring at multiple levels in the lower thoracic spine. Negative for acute fracture or worrisome bone lesion. Review of the MIP images confirms the above findings. IMPRESSION: 1. Stable 4.4 cm ascending aortic aneurysm without complicating features. Recommend annual imaging followup by CTA  or MRA. This recommendation follows 2010 ACCF/AHA/AATS/ACR/ASA/SCA/SCAI/SIR/STS/SVM Guidelines for the Diagnosis and Management of Patients with Thoracic Aortic Disease. Circulation. 2010; 121: Y650-P546 2. Coronary arterial calcifications. The severity of coronary artery disease and any potential stenosis cannot be assessed on this non-gated CT examination. Aortic Atherosclerosis (ICD10-I70.0). Electronically Signed   By: Lucrezia Europe M.D.   On: 12/31/2020 11:42    Assessment & Plan:   There are no diagnoses linked to this encounter.   No orders of the defined types were placed in this encounter.    Follow-up: No follow-ups on file.  Walker Kehr, MD

## 2021-01-03 NOTE — Patient Instructions (Signed)
Diverticulitis  Diverticulitis is when small pouches in your colon (large intestine) get infected or swollen. This causes pain in the belly (abdomen) and watery poop (diarrhea). These pouches are called diverticula. The pouches form in people who have a condition called diverticulosis. What are the causes? This condition may be caused by poop (stool) that gets trapped in the pouches in your colon. The poop lets germs (bacteria) grow in the pouches. This causes the infection. What increases the risk? You are more likely to get this condition if you have small pouches in your colon. The risk is higher if:  You are overweight or very overweight (obese).  You do not exercise enough.  You drink alcohol.  You smoke or use products with tobacco in them.  You eat a diet that has a lot of red meat such as beef, pork, or lamb.  You eat a diet that does not have enough fiber in it.  You are older than 84 years of age. What are the signs or symptoms?  Pain in the belly. Pain is often on the left side, but it may be in other areas.  Fever and feeling cold.  Feeling like you may vomit.  Vomiting.  Having cramps.  Feeling full.  Changes to how often you poop.  Blood in your poop. How is this treated? Most cases are treated at home by:  Taking over-the-counter pain medicines.  Following a clear liquid diet.  Taking antibiotic medicines.  Resting. Very bad cases may need to be treated at a hospital. This may include:  Not eating or drinking.  Taking prescription pain medicine.  Getting antibiotic medicines through an IV tube.  Getting fluid and food through an IV tube.  Having surgery. When you are feeling better, your doctor may tell you to have a test to check your colon (colonoscopy). Follow these instructions at home: Medicines  Take over-the-counter and prescription medicines only as told by your doctor. These include: ? Antibiotics. ? Pain medicines. ? Fiber  pills. ? Probiotics. ? Stool softeners.  If you were prescribed an antibiotic medicine, take it as told by your doctor. Do not stop taking the antibiotic even if you start to feel better.  Ask your doctor if the medicine prescribed to you requires you to avoid driving or using machinery. Eating and drinking  Follow a diet as told by your doctor.  When you feel better, your doctor may tell you to change your diet. You may need to eat a lot of fiber. Fiber makes it easier to poop (have a bowel movement). Foods with fiber include: ? Berries. ? Beans. ? Lentils. ? Green vegetables.  Avoid eating red meat.   General instructions  Do not use any products that contain nicotine or tobacco, such as cigarettes, e-cigarettes, and chewing tobacco. If you need help quitting, ask your doctor.  Exercise 3 or more times a week. Try to get 30 minutes each time. Exercise enough to sweat and make your heart beat faster.  Keep all follow-up visits as told by your doctor. This is important. Contact a doctor if:  Your pain does not get better.  You are not pooping like normal. Get help right away if:  Your pain gets worse.  Your symptoms do not get better.  Your symptoms get worse very fast.  You have a fever.  You vomit more than one time.  You have poop that is: ? Bloody. ? Black. ? Tarry. Summary  This condition happens when   small pouches in your colon get infected or swollen.  Take medicines only as told by your doctor.  Follow a diet as told by your doctor.  Keep all follow-up visits as told by your doctor. This is important. This information is not intended to replace advice given to you by your health care provider. Make sure you discuss any questions you have with your health care provider. Document Revised: 06/08/2019 Document Reviewed: 06/08/2019 Elsevier Patient Education  2021 Elsevier Inc.  

## 2021-01-03 NOTE — Telephone Encounter (Signed)
LVM for patient to call back and make an appointment. Dr.Plotnikov did have a 2 pm open today.

## 2021-01-08 ENCOUNTER — Encounter: Payer: Self-pay | Admitting: Internal Medicine

## 2021-01-08 DIAGNOSIS — K5792 Diverticulitis of intestine, part unspecified, without perforation or abscess without bleeding: Secondary | ICD-10-CM | POA: Insufficient documentation

## 2021-01-08 NOTE — Assessment & Plan Note (Signed)
Cont w/metoprolol, Eliquis Rate controlled

## 2021-01-08 NOTE — Assessment & Plan Note (Signed)
Resolved several days ago.

## 2021-01-10 ENCOUNTER — Telehealth: Payer: Self-pay | Admitting: Internal Medicine

## 2021-01-10 ENCOUNTER — Ambulatory Visit: Payer: Medicare Other

## 2021-01-10 NOTE — Telephone Encounter (Signed)
Called to r/s appt with [facility] on 01/10/21. Patient will call back to R/S

## 2021-01-13 NOTE — Assessment & Plan Note (Signed)
Last abd CT 12/21 Rocephin IM Ceftin po Call if not better

## 2021-01-16 ENCOUNTER — Other Ambulatory Visit: Payer: Self-pay

## 2021-01-16 ENCOUNTER — Ambulatory Visit (INDEPENDENT_AMBULATORY_CARE_PROVIDER_SITE_OTHER): Payer: Medicare Other | Admitting: Internal Medicine

## 2021-01-16 ENCOUNTER — Encounter: Payer: Self-pay | Admitting: Internal Medicine

## 2021-01-16 VITALS — BP 138/84 | HR 74 | Temp 97.6°F | Wt 143.6 lb

## 2021-01-16 DIAGNOSIS — K5792 Diverticulitis of intestine, part unspecified, without perforation or abscess without bleeding: Secondary | ICD-10-CM | POA: Diagnosis not present

## 2021-01-16 DIAGNOSIS — I25119 Atherosclerotic heart disease of native coronary artery with unspecified angina pectoris: Secondary | ICD-10-CM | POA: Diagnosis not present

## 2021-01-16 DIAGNOSIS — R197 Diarrhea, unspecified: Secondary | ICD-10-CM | POA: Diagnosis not present

## 2021-01-16 DIAGNOSIS — R202 Paresthesia of skin: Secondary | ICD-10-CM

## 2021-01-16 DIAGNOSIS — R1084 Generalized abdominal pain: Secondary | ICD-10-CM

## 2021-01-16 NOTE — Patient Instructions (Addendum)
Hold Lipitor for a couple weeks

## 2021-01-16 NOTE — Assessment & Plan Note (Signed)
Hold Lipitor x 2 weeks due to abd pain

## 2021-01-16 NOTE — Assessment & Plan Note (Signed)
We finished po abx Metamucil daily Gi consult w/Dr Henrene Pastor

## 2021-01-16 NOTE — Progress Notes (Signed)
Subjective:  Patient ID: Hailey [name], female    DOB: 11/09/1934  Age: 86 y.o. MRN: [id number]  CC: Follow-up   HPI Hailey [name] presents for abd pain - feeling well today F/u CAD, cramping C/o fingers getting numb - B hands, temporary off and   Outpatient Medications Prior to Visit  Medication Sig Dispense Refill  . acetaminophen (TYLENOL) 500 MG tablet Take 1,000 mg by mouth every 6 (six) hours as needed (body aches / sore hip).    Marland Kitchen acetaminophen-codeine (TYLENOL #2) 300-15 MG tablet Take 1-2 tablets by mouth every 6 (six) hours as needed for severe pain. 40 tablet 0  . atorvastatin (LIPITOR) 80 MG tablet TAKE 1 TABLET DAILY AT 6PM. (Patient taking differently: Take 80 mg by mouth daily.) 90 tablet 2  . cetirizine (ZYRTEC) 10 MG tablet Take 10 mg by mouth daily as needed for allergies.    . clobetasol ointment (TEMOVATE) 0.10 % Apply 1 application topically 2 (two) times daily. 30 g 0  . DULoxetine (CYMBALTA) 60 MG capsule TAKE 1 CAPSULE DAILY. 90 capsule 1  . ELIQUIS 2.5 MG TABS tablet TAKE 1 TABLET BY MOUTH TWICE DAILY. (Patient taking differently: Take 2.5 mg by mouth 2 (two) times daily.) 180 tablet 1  . esomeprazole (NEXIUM) 40 MG capsule TAKE (1) CAPSULE DAILY. 90 capsule 1  . hyoscyamine (LEVSIN) 0.125 MG tablet TAKE 1-2 TABLETS (0.125-0.25 MG) BY MOUTH EVERY 4 HOURS AS NEEDED FOR UP TO 10 DAYS FOR CRAMPING. (Patient taking differently: Take 0.125-0.25 mg by mouth every 4 (four) hours as needed for cramping.) 100 tablet 1  . LORazepam (ATIVAN) 1 MG tablet TAKE 1 TABLET TWICE DAILY AS NEEDED FOR ANXIETY/SLEEP. 180 tablet 1  . losartan (COZAAR) 25 MG tablet Take 1 tablet (25 mg total) by mouth daily. 90 tablet 3  . metoprolol succinate (TOPROL XL) 25 MG 24 hr tablet Take 1 tablet (25 mg total) by mouth daily. 90 tablet 3  . nitroGLYCERIN (NITROSTAT) 0.4 MG SL tablet Place 0.4 mg under the tongue every 5 (five) minutes as needed for chest pain (Call 911 at 3rd dose within 15  minutes.).    Marland Kitchen ondansetron (ZOFRAN ODT) 4 MG disintegrating tablet Take 1 tablet (4 mg total) by mouth every 8 (eight) hours as needed for nausea or vomiting. 20 tablet 0  . saccharomyces boulardii (FLORASTOR) 250 MG capsule Take 1 capsule (250 mg total) by mouth 2 (two) times daily. 60 capsule 2  . sodium chloride (OCEAN) 0.65 % SOLN nasal spray Place 1 spray into both nostrils as needed for congestion.    . metroNIDAZOLE (FLAGYL) 500 MG tablet Take 1 tablet (500 mg total) by mouth 3 (three) times daily. 30 tablet 0   No facility-administered medications prior to visit.    ROS: Review of Systems  Constitutional: Negative for activity change, appetite change, chills, fatigue and unexpected weight change.  HENT: Negative for congestion, mouth sores and sinus pressure.   Eyes: Negative for visual disturbance.  Respiratory: Negative for cough and chest tightness.   Gastrointestinal: Positive for abdominal pain and diarrhea. Negative for nausea.  Genitourinary: Negative for difficulty urinating, frequency and vaginal pain.  Musculoskeletal: Positive for arthralgias and back pain. Negative for gait problem.  Skin: Negative for pallor and rash.  Neurological: Negative for dizziness, tremors, weakness, numbness and headaches.  Hematological: Bruises/bleeds easily.  Psychiatric/Behavioral: Negative for confusion, sleep disturbance and suicidal ideas. The patient is not nervous/anxious.     Objective:  BP 138/84 (  BP Location: Left Arm)   Pulse 74   Temp 97.6 F (36.4 C) (Oral)   Wt 143 lb 9.6 oz (65.1 kg)   BMI 25.44 kg/m   BP Readings from Last 3 Encounters:  01/16/21 138/84  01/03/21 138/90  12/21/20 118/74    Wt Readings from Last 3 Encounters:  01/16/21 143 lb 9.6 oz (65.1 kg)  01/03/21 141 lb 6.4 oz (64.1 kg)  11/04/20 139 lb 12.8 oz (63.4 kg)    Physical Exam Constitutional:      General: She is not in acute distress.    Appearance: She is well-developed.  HENT:      Head: Normocephalic.     Right Ear: External ear normal.     Left Ear: External ear normal.     Nose: Nose normal.  Eyes:     General:        Right eye: No discharge.        Left eye: No discharge.     Conjunctiva/sclera: Conjunctivae normal.     Pupils: Pupils are equal, round, and reactive to light.  Neck:     Thyroid: No thyromegaly.     Vascular: No JVD.     Trachea: No tracheal deviation.  Cardiovascular:     Rate and Rhythm: Normal rate and regular rhythm.     Heart sounds: Normal heart sounds.  Pulmonary:     Effort: No respiratory distress.     Breath sounds: No stridor. No wheezing.  Abdominal:     General: Bowel sounds are normal. There is no distension.     Palpations: Abdomen is soft. There is no mass.     Tenderness: There is no abdominal tenderness. There is no guarding or rebound.  Musculoskeletal:        General: Tenderness present.     Cervical back: Normal range of motion and neck supple.  Lymphadenopathy:     Cervical: No cervical adenopathy.  Skin:    Findings: No erythema or rash.  Neurological:     Mental Status: She is oriented to person, place, and time.     Cranial Nerves: No cranial nerve deficit.     Motor: No abnormal muscle tone.     Coordination: Coordination abnormal.     Gait: Gait abnormal.     Deep Tendon Reflexes: Reflexes normal.  Psychiatric:        Behavior: Behavior normal.        Thought Content: Thought content normal.        Judgment: Judgment normal.     Lab Results  Component Value Date   WBC 7.7 08/30/2020   HGB 8.8 (L) 08/30/2020   HCT 27.4 (L) 08/30/2020   PLT 200 08/30/2020   GLUCOSE 102 (H) 12/26/2020   CHOL 171 11/26/2016   TRIG 99 11/26/2016   HDL 90 11/26/2016   LDLDIRECT 89.1 06/29/2013   LDLCALC 61 11/26/2016   ALT 11 08/30/2020   AST 14 (L) 08/30/2020   NA 137 12/26/2020   K 4.8 12/26/2020   CL 102 12/26/2020   CREATININE 1.17 (H) 12/26/2020   BUN 20 12/26/2020   CO2 19 (L) 12/26/2020   TSH 2.11  04/29/2015   INR 1.1 12/02/2019   HGBA1C 5.6 07/04/2014    CT ANGIO CHEST AORTA W/CM & OR WO/CM  Result Date: 12/31/2020 CLINICAL DATA:  Thoracic aortic aneurysm, follow-up. EXAM: CT ANGIOGRAPHY CHEST WITH CONTRAST TECHNIQUE: Multidetector CT imaging of the chest was performed using the standard protocol during bolus  administration of intravenous contrast. Multiplanar CT image reconstructions and MIPs were obtained to evaluate the vascular anatomy. CONTRAST:  52mL OMNIPAQUE IOHEXOL 350 MG/ML SOLN COMPARISON:  07/01/2020 and previous FINDINGS: Cardiovascular: Heart size upper limits normal. No pericardial effusion. Fair contrast of the pulmonary arterial tree; the exam was not optimized for detection of pulmonary emboli. 3-vessel coronary arterial calcifications. Good contrast opacification of the thoracic aorta without dissection or stenosis. Aortic Root: --Valve: 2.4 cm --Sinuses: 3.5 cm --Sinotubular Junction: 3.2 cm Limitations by motion: Moderate Thoracic Aorta: --Ascending Aorta: 4.4 cm (previously 4.5) --Aortic Arch: 3.5 cm --Descending Aorta: 2.8 Classic 3 vessel brachiocephalic arterial origin anatomy without proximal stenosis. Mild partially calcified plaque in the descending thoracic segment and visualized proximal abdominal aorta. Mediastinum/Nodes: No mass or adenopathy. Lungs/Pleura: No pleural effusion. No pneumothorax. 5 mm intrapulmonary lymph node adjacent to minor fissure stable since 07/03/2014 . Lungs are otherwise clear. Upper Abdomen: No acute findings. Musculoskeletal: T10 benign vertebral hemangioma with stable mild compression deformity. Anterior vertebral endplate spurring at multiple levels in the lower thoracic spine. Negative for acute fracture or worrisome bone lesion. Review of the MIP images confirms the above findings. IMPRESSION: 1. Stable 4.4 cm ascending aortic aneurysm without complicating features. Recommend annual imaging followup by CTA or MRA. This recommendation  follows 2010 ACCF/AHA/AATS/ACR/ASA/SCA/SCAI/SIR/STS/SVM Guidelines for the Diagnosis and Management of Patients with Thoracic Aortic Disease. Circulation. 2010; 121: L798-X211 2. Coronary arterial calcifications. The severity of coronary artery disease and any potential stenosis cannot be assessed on this non-gated CT examination. Aortic Atherosclerosis (ICD10-I70.0). Electronically Signed   By: Lucrezia Europe M.D.   On: 12/31/2020 11:42    Assessment & Plan:    Walker Kehr, MD

## 2021-01-16 NOTE — Assessment & Plan Note (Signed)
F/u w/Dr Henrene Pastor H/o Colitis

## 2021-01-16 NOTE — Assessment & Plan Note (Signed)
Better after po abx Hold Lipitor x 2 weeks

## 2021-01-17 NOTE — Telephone Encounter (Signed)
Completed form placed on MD desk to sign.Marland KitchenJohny Chess

## 2021-01-18 ENCOUNTER — Ambulatory Visit
Admission: RE | Admit: 2021-01-18 | Discharge: 2021-01-18 | Disposition: A | Discharge status: Home / Self Care | Payer: Medicare Other | Source: Ambulatory Visit | Attending: Internal Medicine | Admitting: Internal Medicine

## 2021-01-18 ENCOUNTER — Ambulatory Visit (INDEPENDENT_AMBULATORY_CARE_PROVIDER_SITE_OTHER): Payer: Medicare Other | Admitting: Nurse Practitioner

## 2021-01-18 ENCOUNTER — Other Ambulatory Visit: Payer: Self-pay

## 2021-01-18 ENCOUNTER — Encounter: Payer: Self-pay | Admitting: Nurse Practitioner

## 2021-01-18 VITALS — BP 122/80 | HR 68 | Resp 16 | Ht 62.75 in | Wt 143.0 lb

## 2021-01-18 DIAGNOSIS — Z1231 Encounter for screening mammogram for malignant neoplasm of breast: Secondary | ICD-10-CM

## 2021-01-18 DIAGNOSIS — N763 Subacute and chronic vulvitis: Secondary | ICD-10-CM

## 2021-01-18 DIAGNOSIS — Z9289 Personal history of other medical treatment: Secondary | ICD-10-CM | POA: Diagnosis not present

## 2021-01-18 DIAGNOSIS — Z01419 Encounter for gynecological examination (general) (routine) without abnormal findings: Secondary | ICD-10-CM | POA: Diagnosis not present

## 2021-01-18 DIAGNOSIS — M8589 Other specified disorders of bone density and structure, multiple sites: Secondary | ICD-10-CM

## 2021-01-18 NOTE — Progress Notes (Signed)
   Hailey [name] 05/31/1935 [id number]   History:  86 y.o. G2P2001 presents for breast and pelvic exam without GYN complaints. 2003 TAH BSO - on no HRT. Normal pap and mammogram history. Lichen sclerosus managed with clobetasol. Had persistent vulvitis on left inner labia, biopsy 07/2020 showed chronic inflammation, negative for malignancy. Osteopenia - declined treatment for elevated FRAX in the past. Recently treated for diverticulitis.   Gynecologic History No LMP recorded. Patient has had a hysterectomy.   Contraception/Family planning: status post hysterectomy  Health Maintenance Last Pap: No longer screening per guidelines  Last mammogram: 5/112022. Results were: no report yet Last colonoscopy: 2013 Last Dexa: 11/08/2020. Results were: T-score -2.3, FRAX 18% / 6.2%  Past medical history, past surgical history, family history and social history were all reviewed and documented in the EPIC chart. Married. 2 sons, one in [place].   ROS:  A ROS was performed and pertinent positives and negatives are included.  Exam:  Vitals:   01/18/21 1332  BP: 122/80  Pulse: 68  Resp: 16  Weight: 143 lb (64.9 kg)  Height: 5' 2.75" (1.594 m)   Body mass index is 25.53 kg/m.  General appearance:  Normal Thyroid:  Symmetrical, normal in size, without palpable masses or nodularity. Respiratory  Auscultation:  Clear without wheezing or rhonchi Cardiovascular  Auscultation:  Irregularly irregular, without rubs, murmurs or gallops  Edema/varicosities:  Not grossly evident Abdominal  Soft,nontender, without masses, guarding or rebound.  Liver/spleen:  No organomegaly noted  Hernia:  None appreciated  Skin  Inspection:  Grossly normal Breasts: Examined lying and sitting.   Right: Without masses, retractions, nipple discharge or axillary adenopathy.   Left: Without masses, retractions, nipple discharge or axillary adenopathy. Genitourinary   Inguinal/mons:  Normal without inguinal  adenopathy  External genitalia:  Area of redness on left inner labia where previously biopsied and appearance remains the same since last visit  BUS/Urethra/Skene's glands:  Normal  Vagina:  Normal appearing with normal color and discharge, no lesions  Cervix:  Absent  Uterus:  Absent  Adnexa/parametria:     Rt: Normal in size, without masses or tenderness.   Lt: Normal in size, without masses or tenderness.  Anus and perineum: Normal  Digital rectal exam: Declined  Assessment/Plan:  86 y.o. G2P2001 for annual exam.   Well female exam with routine gynecological exam - Education provided on SBEs, importance of preventative screenings, current guidelines, high calcium diet, regular exercise, and multivitamin daily. Labs with PCP.   Osteopenia of multiple sites - 11/2020 T-score -2.3, FRAX 18% / 6.2%. Declined treatment in the past for elevated FRAX. Recommend taking daily Vitamin D supplement and regular exercise.  Chronic vulvitis - using clobetasol daily on area of concern. No change since previous visit. Biopsy 07/2020 showed chronic inflammation, no malignancy.   Screening for cervical cancer - Normal Pap history. No longer screening per guidelines.   Screening for breast cancer - Normal mammogram history.  Continue annual screenings.  Normal breast exam today.  Screening for colon cancer - 2013 colonoscopy. No longer screening per GI.   Return in 2 years for breast and pelvic exams.    Tamela Gammon DNP, 2:01 PM 01/18/2021

## 2021-01-18 NOTE — Patient Instructions (Signed)
Health Maintenance After Age 84 After age 84, you are at a higher risk for certain long-term diseases and infections as well as injuries from falls. Falls are a major cause of broken bones and head injuries in people who are older than age 84. Getting regular preventive care can help to keep you healthy and well. Preventive care includes getting regular testing and making lifestyle changes as recommended by your health care provider. Talk with your health care provider about:  Which screenings and tests you should have. A screening is a test that checks for a disease when you have no symptoms.  A diet and exercise plan that is right for you. What should I know about screenings and tests to prevent falls? Screening and testing are the best ways to find a health problem early. Early diagnosis and treatment give you the best chance of managing medical conditions that are common after age 84. Certain conditions and lifestyle choices may make you more likely to have a fall. Your health care provider may recommend:  Regular vision checks. Poor vision and conditions such as cataracts can make you more likely to have a fall. If you wear glasses, make sure to get your prescription updated if your vision changes.  Medicine review. Work with your health care provider to regularly review all of the medicines you are taking, including over-the-counter medicines. Ask your health care provider about any side effects that may make you more likely to have a fall. Tell your health care provider if any medicines that you take make you feel dizzy or sleepy.  Osteoporosis screening. Osteoporosis is a condition that causes the bones to get weaker. This can make the bones weak and cause them to break more easily.  Blood pressure screening. Blood pressure changes and medicines to control blood pressure can make you feel dizzy.  Strength and balance checks. Your health care provider may recommend certain tests to check your  strength and balance while standing, walking, or changing positions.  Foot health exam. Foot pain and numbness, as well as not wearing proper footwear, can make you more likely to have a fall.  Depression screening. You may be more likely to have a fall if you have a fear of falling, feel emotionally low, or feel unable to do activities that you used to do.  Alcohol use screening. Using too much alcohol can affect your balance and may make you more likely to have a fall. What actions can I take to lower my risk of falls? General instructions  Talk with your health care provider about your risks for falling. Tell your health care provider if: ? You fall. Be sure to tell your health care provider about all falls, even ones that seem minor. ? You feel dizzy, sleepy, or off-balance.  Take over-the-counter and prescription medicines only as told by your health care provider. These include any supplements.  Eat a healthy diet and maintain a healthy weight. A healthy diet includes low-fat dairy products, low-fat (lean) meats, and fiber from whole grains, beans, and lots of fruits and vegetables. Home safety  Remove any tripping hazards, such as rugs, cords, and clutter.  Install safety equipment such as grab bars in bathrooms and safety rails on stairs.  Keep rooms and walkways well-lit. Activity  Follow a regular exercise program to stay fit. This will help you maintain your balance. Ask your health care provider what types of exercise are appropriate for you.  If you need a cane or walker,   use it as recommended by your health care provider.  Wear supportive shoes that have nonskid soles.   Lifestyle  Do not drink alcohol if your health care provider tells you not to drink.  If you drink alcohol, limit how much you have: ? 0-1 drink a day for women. ? 0-2 drinks a day for men.  Be aware of how much alcohol is in your drink. In the [place] one drink equals one typical bottle of beer (12  oz), one-half glass of wine (5 oz), or one shot of hard liquor (1 oz).  Do not use any products that contain nicotine or tobacco, such as cigarettes and e-cigarettes. If you need help quitting, ask your health care provider. Summary  Having a healthy lifestyle and getting preventive care can help to protect your health and wellness after age 84.  Screening and testing are the best way to find a health problem early and help you avoid having a fall. Early diagnosis and treatment give you the best chance for managing medical conditions that are more common for people who are older than age 84.  Falls are a major cause of broken bones and head injuries in people who are older than age 84. Take precautions to prevent a fall at home.  Work with your health care provider to learn what changes you can make to improve your health and wellness and to prevent falls. This information is not intended to replace advice given to you by your health care provider. Make sure you discuss any questions you have with your health care provider. Document Revised: 12/18/2018 Document Reviewed: 07/10/2017 Elsevier Patient Education  2021 Elsevier Inc.  

## 2021-01-23 ENCOUNTER — Telehealth: Payer: Self-pay | Admitting: Internal Medicine

## 2021-01-23 NOTE — Telephone Encounter (Signed)
Pt scheduled to see Dr. Henrene Pastor 02/14/21@2pm . Pt aware of appt.

## 2021-01-23 NOTE — Telephone Encounter (Signed)
Inbound call from patient. Loose stool, abd pain, discomfort, no spicy foods was feeling better but now having cramps. Next available appointment for Dr. Henrene Pastor is 7/12. Patient did not want to wait that long nor to see a PA. Best contact number [phone].

## 2021-02-01 ENCOUNTER — Telehealth: Payer: Self-pay | Admitting: *Deleted

## 2021-02-01 NOTE — Telephone Encounter (Signed)
Patient called to follow up for Chronic vulvitis she is using the clobetasol ointment daily as recommended and not much relief. She asked your thoughts of another medication? Or other recommendations? Please advise

## 2021-02-01 NOTE — Telephone Encounter (Signed)
Our options at this point would be to remove the area completely - this will take 6-8 weeks to have full effects of procedure but the hope is that it would remove the problem area. Other options are to inject a corticosteroid into the vulvar tissue or she could apply topical lidocaine as needed for management of pain. The lidocaine will only treat the symptoms and likely require indefinite use. Let me know what or if she is interested in any of these options.

## 2021-02-01 NOTE — Telephone Encounter (Signed)
Patient informed with below note, she will use the topical lidocaine she has some at home. She will use this and follow up.

## 2021-02-09 ENCOUNTER — Ambulatory Visit (INDEPENDENT_AMBULATORY_CARE_PROVIDER_SITE_OTHER): Payer: Medicare Other

## 2021-02-09 ENCOUNTER — Other Ambulatory Visit: Payer: Self-pay

## 2021-02-09 VITALS — BP 120/80 | HR 77 | Temp 97.0°F | Ht 63.0 in | Wt 138.0 lb

## 2021-02-09 DIAGNOSIS — Z0000 Encounter for general adult medical examination without abnormal findings: Secondary | ICD-10-CM

## 2021-02-09 NOTE — Progress Notes (Addendum)
Subjective:   Hailey [name] is a 86 y.o. female who presents for Medicare Annual (Subsequent) preventive examination.  Review of Systems    No ROS. Medicare Wellness Visit. Additional risk factors are reflected in social history. Cardiac Risk Factors include: advanced age (>58men, >71 women);hypertension Sleep Patterns: No sleep issues, feels rested on waking and sleeps 6-8 hours nightly. Home Safety/Smoke Alarms: Feels safe in home; uses home alarm. Smoke alarms in place. Living environment: 2-story home; Lives with spouse; no needs for DME; good support system. Seat Belt Safety/Bike Helmet: Wears seat belt.    Objective:    Today's Vitals   02/09/21 1358  BP: 120/80  Pulse: 77  Temp: (!) 97 F (36.1 C)  Weight: 138 lb (62.6 kg)  Height: 5\' 3"  (1.6 m)  PainSc: 8    Body mass index is 24.45 kg/m.  Advanced Directives 02/09/2021 08/27/2020 07/01/2020 12/02/2019 01/10/2018 12/31/2017 12/25/2017  Does Patient Have a Medical Advance Directive? Yes Yes No Yes Yes No No  Type of Advance Directive - - Scientist, water quality of [place];Living will - -  Does patient want to make changes to medical advance directive? No - Patient declined - - No - Patient declined - - -  Copy of [place] in Chart? - - - Yes - validated most recent copy scanned in chart (See row information) Yes - -  Would patient like information on creating a medical advance directive? - - No - Patient declined No - Patient declined - No - Patient declined No - Patient declined    Current Medications (verified) Outpatient Encounter Medications as of 02/09/2021  Medication Sig   acetaminophen (TYLENOL) 500 MG tablet Take 1,000 mg by mouth every 6 (six) hours as needed (body aches / sore hip).   BIOTIN PO Take by mouth.   cetirizine (ZYRTEC) 10 MG tablet Take 10 mg by mouth daily as needed for allergies.   clobetasol ointment (TEMOVATE) 3.79 % Apply 1 application topically 2  (two) times daily.   DULoxetine (CYMBALTA) 60 MG capsule TAKE 1 CAPSULE DAILY.   ELIQUIS 2.5 MG TABS tablet TAKE 1 TABLET BY MOUTH TWICE DAILY. (Patient taking differently: Take 2.5 mg by mouth 2 (two) times daily.)   esomeprazole (NEXIUM) 40 MG capsule TAKE (1) CAPSULE DAILY.   hyoscyamine (LEVSIN) 0.125 MG tablet TAKE 1-2 TABLETS (0.125-0.25 MG) BY MOUTH EVERY 4 HOURS AS NEEDED FOR UP TO 10 DAYS FOR CRAMPING. (Patient taking differently: Take 0.125-0.25 mg by mouth every 4 (four) hours as needed for cramping.)   LORazepam (ATIVAN) 1 MG tablet TAKE 1 TABLET TWICE DAILY AS NEEDED FOR ANXIETY/SLEEP.   losartan (COZAAR) 25 MG tablet Take 1 tablet (25 mg total) by mouth daily.   metoprolol succinate (TOPROL XL) 25 MG 24 hr tablet Take 1 tablet (25 mg total) by mouth daily.   nitroGLYCERIN (NITROSTAT) 0.4 MG SL tablet Place 0.4 mg under the tongue every 5 (five) minutes as needed for chest pain (Call 911 at 3rd dose within 15 minutes.).   ondansetron (ZOFRAN ODT) 4 MG disintegrating tablet Take 1 tablet (4 mg total) by mouth every 8 (eight) hours as needed for nausea or vomiting.   saccharomyces boulardii (FLORASTOR) 250 MG capsule Take 1 capsule (250 mg total) by mouth 2 (two) times daily.   sodium chloride (OCEAN) 0.65 % SOLN nasal spray Place 1 spray into both nostrils as needed for congestion.   No facility-administered encounter medications on file  as of 02/09/2021.    Allergies (verified) Macrobid [nitrofurantoin monohyd macro], Meloxicam, Digoxin and related, Mobic [meloxicam], and Sulfamethoxazole-trimethoprim   History: Past Medical History:  Diagnosis Date   Acute blood loss anemia 12/25/2017   Allergic rhinitis    Anxiety    Arthritis    "my whole spine" (07/01/2017)   Atrial fibrillation (HCC)    Bowel obstruction (HCC)    in [place]   Bradycardia with 41-50 beats per minute 12/27/2017   Cancer ([place])    Cellulitis of leg, right with large prepatella hematoma and open wounds  12/26/2017   CKD (chronic kidney disease) stage 2, GFR 60-89 ml/min    Colon polyps    Coronary artery disease    10/18 PCI/DES to p/m LCx with cutting balloon to mLcx   Diverticulosis of colon    GERD (gastroesophageal reflux disease)    Hip bursitis 2010   Dr Para March, Post op seroma   History of colon polyps    HSV (herpes simplex virus) anogenital infection 07/2019   HTN (hypertension)    IBS (irritable bowel syndrome)    constipation predominant - Dr Earlean Shawl   Lichen sclerosus    Osteopenia 11/2016   T score -2.0 FRAX 15%/4.3%   PAC (premature atrial contraction)    Symptomatiic   Renal insufficiency    Right bundle branch block (RBBB) on electrocardiogram (ECG) 12/27/2017   Scoliosis    SVT (supraventricular tachycardia) ([place])    brief history   Past Surgical History:  Procedure Laterality Date   ANTERIOR AND POSTERIOR VAGINAL REPAIR  01/2002   Archie Endo 01/23/2011   APPENDECTOMY  1948   CARDIAC CATHETERIZATION  06/26/2017   CORONARY ANGIOPLASTY WITH STENT PLACEMENT  07/01/2017   CORONARY ATHERECTOMY N/A 07/01/2017   Procedure: CORONARY ATHERECTOMY;  Surgeon: Belva Crome, MD;  Location: [facility];  Service: Cardiovascular;  Laterality: N/A;   CORONARY STENT INTERVENTION N/A 07/01/2017   Procedure: CORONARY STENT INTERVENTION;  Surgeon: Belva Crome, MD;  Location: [facility];  Service: Cardiovascular;  Laterality: N/A;   HAMMER TOE SURGERY     HEMORRHOID BANDING     HIP SURGERY Left 04/2009   hip examination under anesthesia followed by greater trochanteric bursectomy; iliotibial band tenotomy/notes 01/20/2011   I & D EXTREMITY Right 01/10/2018   Procedure: IRRIGATION AND DEBRIDEMENT RIGHT KNEE, APPLY WOUND VAC;  Surgeon: Newt Minion, MD;  Location: [facility];  Service: Orthopedics;  Laterality: Right;   KNEE BURSECTOMY Right 04/2009   Archie Endo 01/09/2011   LEFT HEART CATH AND CORONARY ANGIOGRAPHY N/A 06/26/2017   Procedure: LEFT HEART CATH AND CORONARY  ANGIOGRAPHY;  Surgeon: Belva Crome, MD;  Location: [facility];  Service: Cardiovascular;  Laterality: N/A;   PUBOVAGINAL SLING  01/2002   Archie Endo 01/23/2011   REDUCTION MAMMAPLASTY     TEMPORARY PACEMAKER N/A 07/01/2017   Procedure: TEMPORARY PACEMAKER;  Surgeon: Belva Crome, MD;  Location: [facility];  Service: Cardiovascular;  Laterality: N/A;   VAGINAL HYSTERECTOMY  01/2002   Vaginal hysterectomy, bilateral salpingo-oophorectomy/notes 01/23/2011   Family History  Problem Relation Age of Onset   Colon cancer Mother    Diabetes Father    Prostate cancer Father    Prostate cancer Brother    Pancreatic cancer Brother    Stomach cancer Son    Heart attack Neg Hx    Stroke Neg Hx    Esophageal cancer Neg Hx    Social History  Socioeconomic History   Marital status: Married    Spouse name: Not on file   Number of children: 2   Years of education: Not on file   Highest education level: Not on file  Occupational History   Occupation: Patent attorney: RETIRED  Tobacco Use   Smoking status: Former Smoker    Packs/day: 0.25    Years: 28.00    Pack years: 7.00    Types: Cigarettes    Quit date: 1981    Years since quitting: 41.4   Smokeless tobacco: Never Used  Scientific laboratory technician Use: Never used  Substance and Sexual Activity   Alcohol use: Yes    Comment: 7 vodka drinks a week   Drug use: Yes    Types: Cocaine   Sexual activity: Not Currently    Birth control/protection: Surgical    Comment: hysterectomy  Other Topics Concern   Not on file  Social History Narrative   Regular Exercise -  YES         Social Determinants of Health   Financial Resource Strain: Low Risk    Difficulty of Paying Living Expenses: Not hard at all  Food Insecurity: No Food Insecurity   Worried About Charity fundraiser in the Last Year: Never true   Ran Out of Food in the Last Year: Never true  Transportation Needs: No Transportation Needs   Lack of  Transportation (Medical): No   Lack of Transportation (Non-Medical): No  Physical Activity: Inactive   Days of Exercise per Week: 0 days   Minutes of Exercise per Session: 0 min  Stress: No Stress Concern Present   Feeling of Stress : Not at all  Social Connections: Socially Integrated   Frequency of Communication with Friends and Family: More than three times a week   Frequency of Social Gatherings with Friends and Family: Twice a week   Attends Religious Services: More than 4 times per year   Active Member of [employer] or Organizations: No   Attends Music therapist: More than 4 times per year   Marital Status: Married    Tobacco Counseling Counseling given: Not Answered   Clinical Intake:  Pre-visit preparation completed: Yes  Pain : 0-10 Pain Score: 8  Pain Type: Chronic pain Pain Location: Leg Pain Orientation: Right Pain Radiating Towards: Thigh area Pain Descriptors / Indicators: Throbbing Pain Onset: More than a month ago Pain Frequency: Intermittent Pain Relieving Factors: Tylenol Effect of Pain on Daily Activities: Pain can diminish job performance, lower motivation to exercise, and prevent you from completing daily tasks. Pain produces disability and affects the quality of life.  Pain Relieving Factors: Tylenol  BMI - recorded: 24.45 Nutritional Status: BMI of 19-24  Normal Nutritional Risks: None Diabetes: No  How often do you need to have someone help you when you read instructions, pamphlets, or other written materials from your doctor or pharmacy?: 1 - Never What is the last grade level you completed in school?: Bachelor's Degree  Diabetic? no  Interpreter Needed?: No  Information entered by :: Lisette Abu, LPN   Activities of Daily Living In your present state of health, do you have any difficulty performing the following activities: 02/09/2021 08/27/2020  Hearing? N N  Vision? N N  Difficulty concentrating or making decisions? N N   Walking or climbing stairs? N N  Dressing or bathing? N N  Doing errands, shopping? N N  Preparing Food and eating ? N -  Using the Toilet? N -  In the past six months, have you accidently leaked urine? N -  Do you have problems with loss of bowel control? N -  Managing your Medications? N -  Managing your Finances? N -  Housekeeping or managing your Housekeeping? N -  Some recent data might be hidden    Patient Care Team: Plotnikov, Evie Lacks, MD as PCP - General Tamala Julian Lynnell Dike, MD as PCP - Cardiology (Cardiology) Irene Shipper, MD as Consulting Physician (Gastroenterology) Darleen Crocker, MD as Consulting Physician (Ophthalmology)  Indicate any recent Medical Services you may have received from other than [facility] providers in the past year (date may be approximate).     Assessment:   This is a routine wellness examination for Kayleena.  Hearing/Vision screen No exam data present  Dietary issues and exercise activities discussed: Current Exercise Habits: The patient does not participate in regular exercise at present, Exercise limited by: cardiac condition(s);respiratory conditions(s);orthopedic condition(s);psychological condition(s)  Goals Addressed   None    Depression Screen PHQ 2/9 Scores 02/09/2021 12/03/2017 08/05/2017 07/11/2017 05/29/2016 12/08/2015 12/08/2015  PHQ - 2 Score 0 0 0 1 0 0 0  Exception Documentation - - - - - Other- indicate reason in comment box Other- indicate reason in comment box    Fall Risk Fall Risk  02/09/2021 07/11/2017 05/29/2016 12/08/2015 12/08/2015  Falls in the past year? 0 No No No No  Comment - - - - -  Number falls in past yr: 0 - - - -  Injury with Fall? 0 - - - -  Risk for fall due to : No Fall Risks - - Other (Comment) Other (Comment)  Follow up Falls evaluation completed - - - -  Comment - - - - -    FALL RISK PREVENTION PERTAINING TO THE HOME:  Any stairs in or around the home? Yes  If so, are there any without handrails? No  Home  free of loose throw rugs in walkways, pet beds, electrical cords, etc? Yes  Adequate lighting in your home to reduce risk of falls? Yes   ASSISTIVE DEVICES UTILIZED TO PREVENT FALLS:  Life alert? Yes  (Apple Watch) Use of a cane, walker or w/c? No  Grab bars in the bathroom? Yes  Shower chair or bench in shower? Yes  Elevated toilet seat or a handicapped toilet? No   TIMED UP AND GO:  Was the test performed? No .  Length of time to ambulate 10 feet: 0 sec.   Gait steady and fast without use of assistive device  Cognitive Function: Normal cognitive status assessed by direct observation by this Nurse Health Advisor. No abnormalities found.   MMSE - Mini Mental State Exam 10/10/2015  Not completed: (No Data)        Immunizations Immunization History  Administered Date(s) Administered   Influenza Split 06/18/2012   Influenza Whole 05/26/2008, 06/05/2010   Influenza, High Dose Seasonal PF 06/03/2014, 06/12/2017, 05/22/2018, 05/14/2019, 06/07/2020   Influenza,inj,Quad PF,6+ Mos 06/09/2015   Influenza-Unspecified 06/08/2013   PFIZER(Purple Top)SARS-COV-2 Vaccination 09/24/2019, 10/15/2019, 06/07/2020   Pneumococcal Conjugate-13 01/25/2014   Pneumococcal Polysaccharide-23 11/07/2018   Td 06/11/2007, 06/17/2007   Tdap 12/22/2017   Zoster, Live 12/01/2008    TDAP status: Up to date  Flu Vaccine status: Up to date  Pneumococcal vaccine status: Up to date  Covid-19 vaccine status: Completed vaccines  Qualifies for Shingles Vaccine? Yes   Zostavax completed Yes   Shingrix Completed?: No.  Education has been provided regarding the importance of this vaccine. Patient has been advised to call insurance company to determine out of pocket expense if they have not yet received this vaccine. Advised may also receive vaccine at local pharmacy or Health Dept. Verbalized acceptance and understanding.  Screening Tests Health Maintenance  Topic Date Due   Zoster Vaccines- Shingrix (1  of 2) Never done   COVID-19 Vaccine (4 - Booster for Pfizer series) 09/06/2020   INFLUENZA VACCINE  04/10/2021   TETANUS/TDAP  12/23/2027   DEXA SCAN  Completed   PNA vac Low Risk Adult  Completed   HPV VACCINES  Aged Out    Health Maintenance  Health Maintenance Due  Topic Date Due   Zoster Vaccines- Shingrix (1 of 2) Never done   COVID-19 Vaccine (4 - Booster for Pfizer series) 09/06/2020    Colorectal cancer screening: No longer required.   Mammogram status: Completed 01/18/2021. Repeat every year  Bone Density status: Completed 11/08/2020. Results reflect: Bone density results: OSTEOPENIA. Repeat every 2 years.  Lung Cancer Screening: (Low Dose CT Chest recommended if Age 27-80 years, 30 pack-year currently smoking OR have quit w/in 15years.) does not qualify.   Lung Cancer Screening Referral: no  Additional Screening:  Hepatitis C Screening: does not qualify; Completed no  Vision Screening: Recommended annual ophthalmology exams for early detection of glaucoma and other disorders of the eye. Is the patient up to date with their annual eye exam?  Yes  Who is the provider or what is the name of the office in which the patient attends annual eye exams? Darleen Crocker, MD. If pt is not established with a provider, would they like to be referred to a provider to establish care? No .   Dental Screening: Recommended annual dental exams for proper oral hygiene  Community Resource Referral / Chronic Care Management: CRR required this visit?  No   CCM required this visit?  No      Plan:     I have personally reviewed and noted the following in the patient's chart:   Medical and social history Use of alcohol, tobacco or illicit drugs  Current medications and supplements including opioid prescriptions.  Functional ability and status Nutritional status Physical activity Advanced directives List of other physicians Hospitalizations, surgeries, and ER visits in previous 12  months Vitals Screenings to include cognitive, depression, and falls Referrals and appointments  In addition, I have reviewed and discussed with patient certain preventive protocols, quality metrics, and best practice recommendations. A written personalized care plan for preventive services as well as general preventive health recommendations were provided to patient.     Sheral Flow, LPN   02/13/629   Nurse Notes:  Medications reviewed with patient; no opioid use noted.   Medical screening examination/treatment/procedure(s) were performed by non-physician practitioner and as supervising physician I was immediately available for consultation/collaboration.  I agree with above. Lew Dawes, MD

## 2021-02-09 NOTE — Patient Instructions (Addendum)
Hailey [name] , Thank you for taking time to come for your Medicare Wellness Visit. I appreciate your ongoing commitment to your health goals. Please review the following plan we discussed and let me know if I can assist you in the future.   Screening recommendations/referrals: Colonoscopy: no repeat due to age 85: 01/18/2021; due every year Bone Density: 11/08/2020, due every 2 years Recommended yearly ophthalmology/optometry visit for glaucoma screening and checkup Recommended yearly dental visit for hygiene and checkup  Vaccinations: Influenza vaccine: 06/07/2020 Pneumococcal vaccine: 11/07/2018, 01/25/2014 Tdap vaccine: 12/22/2017; due every 10 years Shingles vaccine: never done; can check with local p[pharmacy for price and vaccine.   Covid-19: 09/24/2019, 10/15/2019, 06/07/2020  Advanced directives: Documents on file.  Conditions/risks identified: Yes; Reviewed health maintenance screenings with patient today and relevant education, vaccines, and/or referrals were provided. Please continue to do your personal lifestyle choices by: daily care of teeth and gums, regular physical activity (goal should be 5 days a week for 30 minutes), eat a healthy diet, avoid tobacco and drug use, limiting any alcohol intake, taking a low-dose aspirin (if not allergic or have been advised by your provider otherwise) and taking vitamins and minerals as recommended by your provider. Continue doing brain stimulating activities (puzzles, reading, adult coloring books, staying active) to keep memory sharp. Continue to eat heart healthy diet (full of fruits, vegetables, whole grains, lean protein, water--limit salt, fat, and sugar intake) and increase physical activity as tolerated.  Next appointment: Please schedule your next Medicare Wellness Visit with your Nurse Health Advisor in 1 year by calling [phone].  Preventive Care 58 Years and Older, Female Preventive care refers to lifestyle choices and visits with  your health care provider that can promote health and wellness. What does preventive care include?  A yearly physical exam. This is also called an annual well check.  Dental exams once or twice a year.  Routine eye exams. Ask your health care provider how often you should have your eyes checked.  Personal lifestyle choices, including:  Daily care of your teeth and gums.  Regular physical activity.  Eating a healthy diet.  Avoiding tobacco and drug use.  Limiting alcohol use.  Practicing safe sex.  Taking low-dose aspirin every day.  Taking vitamin and mineral supplements as recommended by your health care provider. What happens during an annual well check? The services and screenings done by your health care provider during your annual well check will depend on your age, overall health, lifestyle risk factors, and family history of disease. Counseling  Your health care provider may ask you questions about your:  Alcohol use.  Tobacco use.  Drug use.  Emotional well-being.  Home and relationship well-being.  Sexual activity.  Eating habits.  History of falls.  Memory and ability to understand (cognition).  Work and work Statistician.  Reproductive health. Screening  You may have the following tests or measurements:  Height, weight, and BMI.  Blood pressure.  Lipid and cholesterol levels. These may be checked every 5 years, or more frequently if you are over 40 years old.  Skin check.  Lung cancer screening. You may have this screening every year starting at age 44 if you have a 30-pack-year history of smoking and currently smoke or have quit within the past 15 years.  Fecal occult blood test (FOBT) of the stool. You may have this test every year starting at age 77.  Flexible sigmoidoscopy or colonoscopy. You may have a sigmoidoscopy every 5 years or a  colonoscopy every 10 years starting at age 11.  Hepatitis C blood test.  Hepatitis B blood  test.  Sexually transmitted disease (STD) testing.  Diabetes screening. This is done by checking your blood sugar (glucose) after you have not eaten for a while (fasting). You may have this done every 1-3 years.  Bone density scan. This is done to screen for osteoporosis. You may have this done starting at age 66.  Mammogram. This may be done every 1-2 years. Talk to your health care provider about how often you should have regular mammograms. Talk with your health care provider about your test results, treatment options, and if necessary, the need for more tests. Vaccines  Your health care provider may recommend certain vaccines, such as:  Influenza vaccine. This is recommended every year.  Tetanus, diphtheria, and acellular pertussis (Tdap, Td) vaccine. You may need a Td booster every 10 years.  Zoster vaccine. You may need this after age 2.  Pneumococcal 13-valent conjugate (PCV13) vaccine. One dose is recommended after age 38.  Pneumococcal polysaccharide (PPSV23) vaccine. One dose is recommended after age 67. Talk to your health care provider about which screenings and vaccines you need and how often you need them. This information is not intended to replace advice given to you by your health care provider. Make sure you discuss any questions you have with your health care provider. Document Released: 09/23/2015 Document Revised: 05/16/2016 Document Reviewed: 06/28/2015 Elsevier Interactive Patient Education  2017 [place] Prevention in the Home Falls can cause injuries. They can happen to people of all ages. There are many things you can do to make your home safe and to help prevent falls. What can I do on the outside of my home?  Regularly fix the edges of walkways and driveways and fix any cracks.  Remove anything that might make you trip as you walk through a door, such as a raised step or threshold.  Trim any bushes or trees on the path to your home.  Use  bright outdoor lighting.  Clear any walking paths of anything that might make someone trip, such as rocks or tools.  Regularly check to see if handrails are loose or broken. Make sure that both sides of any steps have handrails.  Any raised decks and porches should have guardrails on the edges.  Have any leaves, snow, or ice cleared regularly.  Use sand or salt on walking paths during winter.  Clean up any spills in your garage right away. This includes oil or grease spills. What can I do in the bathroom?  Use night lights.  Install grab bars by the toilet and in the tub and shower. Do not use towel bars as grab bars.  Use non-skid mats or decals in the tub or shower.  If you need to sit down in the shower, use a plastic, non-slip stool.  Keep the floor dry. Clean up any water that spills on the floor as soon as it happens.  Remove soap buildup in the tub or shower regularly.  Attach bath mats securely with double-sided non-slip rug tape.  Do not have throw rugs and other things on the floor that can make you trip. What can I do in the bedroom?  Use night lights.  Make sure that you have a light by your bed that is easy to reach.  Do not use any sheets or blankets that are too big for your bed. They should not hang down onto the  floor.  Have a firm chair that has side arms. You can use this for support while you get dressed.  Do not have throw rugs and other things on the floor that can make you trip. What can I do in the kitchen?  Clean up any spills right away.  Avoid walking on wet floors.  Keep items that you use a lot in easy-to-reach places.  If you need to reach something above you, use a strong step stool that has a grab bar.  Keep electrical cords out of the way.  Do not use floor polish or wax that makes floors slippery. If you must use wax, use non-skid floor wax.  Do not have throw rugs and other things on the floor that can make you trip. What can  I do with my stairs?  Do not leave any items on the stairs.  Make sure that there are handrails on both sides of the stairs and use them. Fix handrails that are broken or loose. Make sure that handrails are as long as the stairways.  Check any carpeting to make sure that it is firmly attached to the stairs. Fix any carpet that is loose or worn.  Avoid having throw rugs at the top or bottom of the stairs. If you do have throw rugs, attach them to the floor with carpet tape.  Make sure that you have a light switch at the top of the stairs and the bottom of the stairs. If you do not have them, ask someone to add them for you. What else can I do to help prevent falls?  Wear shoes that:  Do not have high heels.  Have rubber bottoms.  Are comfortable and fit you well.  Are closed at the toe. Do not wear sandals.  If you use a stepladder:  Make sure that it is fully opened. Do not climb a closed stepladder.  Make sure that both sides of the stepladder are locked into place.  Ask someone to hold it for you, if possible.  Clearly mark and make sure that you can see:  Any grab bars or handrails.  First and last steps.  Where the edge of each step is.  Use tools that help you move around (mobility aids) if they are needed. These include:  Canes.  Walkers.  Scooters.  Crutches.  Turn on the lights when you go into a dark area. Replace any light bulbs as soon as they burn out.  Set up your furniture so you have a clear path. Avoid moving your furniture around.  If any of your floors are uneven, fix them.  If there are any pets around you, be aware of where they are.  Review your medicines with your doctor. Some medicines can make you feel dizzy. This can increase your chance of falling. Ask your doctor what other things that you can do to help prevent falls. This information is not intended to replace advice given to you by your health care provider. Make sure you  discuss any questions you have with your health care provider. Document Released: 06/23/2009 Document Revised: 02/02/2016 Document Reviewed: 10/01/2014 Elsevier Interactive Patient Education  2017 [employer].

## 2021-02-13 ENCOUNTER — Other Ambulatory Visit: Payer: Self-pay | Admitting: Interventional Cardiology

## 2021-02-13 NOTE — Telephone Encounter (Signed)
Patient reports that she is having diarrhea and lower abdominal pain. She had some oxycontin at home and took this this morning.  She is feeling a bit better, but still has diarrhea.  She is advised that okay to take imodium until appointment tomorrow at 2:00 with Dr. Henrene Pastor. Patient is advised okay to take hyoscyamine as prescribed by her PCP.  She is asked to keep her appointment for tomorrow.

## 2021-02-13 NOTE — Telephone Encounter (Signed)
Pt's age 86, wt 62.6 kg, SCr 1.17, CrCl 35.37, last ov w/ HS 07/07/20.

## 2021-02-13 NOTE — Telephone Encounter (Signed)
Patient called in with complaints of severe diarrhea and stomach cramps. Wants a call back before her appointment 02/14/2021 to discuss what she can take for today to help relieve herself of pain. Best contact number [phone]

## 2021-02-14 ENCOUNTER — Ambulatory Visit: Payer: Medicare Other | Admitting: Internal Medicine

## 2021-02-14 ENCOUNTER — Encounter: Payer: Self-pay | Admitting: Internal Medicine

## 2021-02-14 VITALS — BP 108/60 | HR 96 | Ht 63.0 in | Wt 136.0 lb

## 2021-02-14 DIAGNOSIS — R197 Diarrhea, unspecified: Secondary | ICD-10-CM | POA: Diagnosis not present

## 2021-02-14 DIAGNOSIS — K589 Irritable bowel syndrome without diarrhea: Secondary | ICD-10-CM | POA: Diagnosis not present

## 2021-02-14 NOTE — Patient Instructions (Signed)
If you are age 84 or older, your body mass index should be between 23-30. Your Body mass index is 24.09 kg/m. If this is out of the aforementioned range listed, please consider follow up with your Primary Care Provider.  If you are age 42 or younger, your body mass index should be between 19-25. Your Body mass index is 24.09 kg/m. If this is out of the aformentioned range listed, please consider follow up with your Primary Care Provider.   __________________________________________________________  The [facility] providers would like to encourage you to use [facility] to communicate with providers for non-urgent requests or questions.  Due to long hold times on the telephone, sending your provider a message by [facility] may be a faster and more efficient way to get a response.  Please allow 48 business hours for a response.  Please remember that this is for non-urgent requests.    Please follow up as needed

## 2021-02-14 NOTE — Progress Notes (Signed)
HISTORY OF PRESENT ILLNESS:  Hailey [name] is a 86 y.o. female with past medical history as listed below here today for syndrome, who was last seen in his office November 04, 2020 for follow-up posthospitalization for bacterial colitis.  See that dictation for details.  She also has a history of irritable bowel syndrome and GERD.  She presents today for follow-up regarding problems with diarrhea that began approximately 1 month ago.  She states that she is having trouble getting in contact with our office and reached out to her PCP who provided 5 days of antibiotics.  Her problems with diarrhea resolved.  She tells me that she was doing well until yesterday when she developed lower abdominal cramping which did not respond to Levsin.  She subsequently took 1 oxycodone.  Her pain resolved.  She subsequently had loose stool.  Feeling better today with no particular issues.  REVIEW OF SYSTEMS:  All non-GI ROS negative unless otherwise stated in the HPI except for anxiety, arthritis, back pain, fatigue, cough, shortness of breath, irregular heartbeat, hearing problems, sleeping problems  Past Medical History:  Diagnosis Date  . Acute blood loss anemia 12/25/2017  . Allergic rhinitis   . Anxiety   . Arthritis    "my whole spine" (07/01/2017)  . Atrial fibrillation ([place])   . Bowel obstruction ([place])    in [place]  . Bradycardia with 41-50 beats per minute 12/27/2017  . Cancer ([place])   . Cellulitis of leg, right with large prepatella hematoma and open wounds 12/26/2017  . CKD (chronic kidney disease) stage 2, GFR 60-89 ml/min   . Colon polyps   . Coronary artery disease    10/18 PCI/DES to p/m LCx with cutting balloon to mLcx  . Diverticulosis of colon   . GERD (gastroesophageal reflux disease)   . Hip bursitis 2010   Dr Para March, Post op seroma  . History of colon polyps   . HSV (herpes simplex virus) anogenital infection 07/2019  . HTN (hypertension)   . IBS (irritable bowel syndrome)     constipation predominant - Dr Earlean Shawl  . Lichen sclerosus   . Osteopenia 11/2016   T score -2.0 FRAX 15%/4.3%  . PAC (premature atrial contraction)    Symptomatiic  . Renal insufficiency   . Right bundle branch block (RBBB) on electrocardiogram (ECG) 12/27/2017  . Scoliosis   . SVT (supraventricular tachycardia) ([place])    brief history    Past Surgical History:  Procedure Laterality Date  . ANTERIOR AND POSTERIOR VAGINAL REPAIR  01/2002   Archie Endo 01/23/2011  . APPENDECTOMY  1948  . CARDIAC CATHETERIZATION  06/26/2017  . CORONARY ANGIOPLASTY WITH STENT PLACEMENT  07/01/2017  . CORONARY ATHERECTOMY N/A 07/01/2017   Procedure: CORONARY ATHERECTOMY;  Surgeon: Belva Crome, MD;  Location: [facility];  Service: Cardiovascular;  Laterality: N/A;  . CORONARY STENT INTERVENTION N/A 07/01/2017   Procedure: CORONARY STENT INTERVENTION;  Surgeon: Belva Crome, MD;  Location: [facility];  Service: Cardiovascular;  Laterality: N/A;  . HAMMER TOE SURGERY    . HEMORRHOID BANDING    . HIP SURGERY Left 04/2009   hip examination under anesthesia followed by greater trochanteric bursectomy; iliotibial band tenotomy/notes 01/20/2011  . I & D EXTREMITY Right 01/10/2018   Procedure: IRRIGATION AND DEBRIDEMENT RIGHT KNEE, APPLY WOUND VAC;  Surgeon: Newt Minion, MD;  Location: [facility];  Service: Orthopedics;  Laterality: Right;  . KNEE BURSECTOMY Right 04/2009   Archie Endo 01/09/2011  . LEFT  HEART CATH AND CORONARY ANGIOGRAPHY N/A 06/26/2017   Procedure: LEFT HEART CATH AND CORONARY ANGIOGRAPHY;  Surgeon: Belva Crome, MD;  Location: [facility];  Service: Cardiovascular;  Laterality: N/A;  . PUBOVAGINAL SLING  01/2002   Archie Endo 01/23/2011  . REDUCTION MAMMAPLASTY    . TEMPORARY PACEMAKER N/A 07/01/2017   Procedure: TEMPORARY PACEMAKER;  Surgeon: Belva Crome, MD;  Location: [facility];  Service: Cardiovascular;  Laterality: N/A;  . VAGINAL HYSTERECTOMY  01/2002   Vaginal  hysterectomy, bilateral salpingo-oophorectomy/notes 01/23/2011    Social History Hailey [name]  reports that she quit smoking about 41 years ago. Her smoking use included cigarettes. She has a 7.00 pack-year smoking history. She has never used smokeless tobacco. She reports current alcohol use. She reports that she does not use drugs.  family history includes Colon cancer in her mother; Diabetes in her father; Pancreatic cancer in her brother; Prostate cancer in her brother and father; Stomach cancer in her son.  Allergies  Allergen Reactions  . Macrobid [employer] Macro] Other (See Comments)    Nausea, stomach cramps, fatigue , headache.  . Meloxicam Other (See Comments)    Jittery and headache  . Digoxin And Related     headaches  . Mobic [Meloxicam]   . Sulfamethoxazole-Trimethoprim Nausea Only       PHYSICAL EXAMINATION: Vital signs: BP 108/60   Pulse 96   Ht 5\' 3"  (1.6 m)   Wt 136 lb (61.7 kg)   BMI 24.09 kg/m   Constitutional: generally well-appearing, no acute distress Psychiatric: alert and oriented x3, cooperative Eyes: extraocular movements intact, anicteric, conjunctiva pink Mouth: oral pharynx moist, no lesions Neck: supple no lymphadenopathy Cardiovascular: heart regular rate and rhythm, no murmur Lungs: clear to auscultation bilaterally Abdomen: soft, nontender, nondistended, no obvious ascites, no peritoneal signs, normal bowel sounds, no organomegaly Rectal: Omitted Extremities: no clubbing, cyanosis, or lower extremity edema bilaterally Skin: Multiple ecchymoses of the lower and upper extremities Neuro: No focal deficits.  Cranial nerves intact  ASSESSMENT:  1.  Recent problems with diarrhea.  Likely IBS flare.  Doing better. 2.  GERD. 3.  History of bacterial colitis 4.  Multiple medical problems   PLAN:  1.  Continue probiotic 2.  Antispasmodics as needed.  She has Levsin available 3.  Reflux precautions 4.  GI follow-up as  needed

## 2021-02-15 ENCOUNTER — Telehealth: Payer: Self-pay | Admitting: Internal Medicine

## 2021-02-15 ENCOUNTER — Other Ambulatory Visit: Payer: Self-pay | Admitting: Internal Medicine

## 2021-02-15 NOTE — Telephone Encounter (Signed)
See pt advice request. 

## 2021-02-15 NOTE — Telephone Encounter (Signed)
Inbound call from pt requesting a call back stating that she is experiencing Diarrhea and stomach cramping. Please advise. Thank you

## 2021-02-20 ENCOUNTER — Other Ambulatory Visit: Payer: Self-pay

## 2021-02-20 ENCOUNTER — Telehealth: Payer: Self-pay

## 2021-02-20 ENCOUNTER — Ambulatory Visit: Payer: Medicare Other | Admitting: Nurse Practitioner

## 2021-02-20 VITALS — BP 130/78

## 2021-02-20 DIAGNOSIS — B373 Candidiasis of vulva and vagina: Secondary | ICD-10-CM

## 2021-02-20 DIAGNOSIS — N763 Subacute and chronic vulvitis: Secondary | ICD-10-CM

## 2021-02-20 DIAGNOSIS — N898 Other specified noninflammatory disorders of vagina: Secondary | ICD-10-CM | POA: Diagnosis not present

## 2021-02-20 DIAGNOSIS — B3731 Acute candidiasis of vulva and vagina: Secondary | ICD-10-CM

## 2021-02-20 LAB — WET PREP FOR TRICH, YEAST, CLUE: RESULT: 8

## 2021-02-20 MED ORDER — FLUCONAZOLE 150 MG PO TABS
150.0000 mg | ORAL_TABLET | ORAL | 0 refills | Status: DC
Start: 2021-02-20 — End: 2021-05-17

## 2021-02-20 NOTE — Telephone Encounter (Signed)
Patient called stating that she is using Clobetasol ointment for vulvar area but "thinks if has spread into vagina".  She is leaving Thursday to go to the beach for the summer.  I recommended office visit to assess.  Patient agreed and staff message sent to [facility] to call patient to schedule visit this week.

## 2021-02-20 NOTE — Progress Notes (Signed)
   Acute Office Visit  Subjective:    Patient ID: Hailey [name], female    DOB: Jan 10, 1935, 86 y.o.   MRN: [id number]   HPI 86 y.o. presents today for vaginal irritation. She has chronic inflammation of left labia that was biopsied 07/2020. This is managed with clobetasol daily and lidocaine ointment as needed. She feels like the irritation is worse the last few days than normal, although area is always bothersome despite course of treatment. We have previously discussed other options to include excision of area versus corticosteroid injection. She opted to continue Clobetasol and lidocaine at that time. She is leaving for the beach in 3 days and wants to get checked out.    Review of Systems  Constitutional: Negative.   Genitourinary:  Positive for vaginal pain. Negative for vaginal discharge.       Vulvar irritation/burning      Objective:    Physical Exam Constitutional:      Appearance: Normal appearance.  Genitourinary:     BP 130/78  Wt Readings from Last 3 Encounters:  02/14/21 136 lb (61.7 kg)  02/09/21 138 lb (62.6 kg)  01/18/21 143 lb (64.9 kg)   Wet prep + yeast     Assessment & Plan:   Problem List Items Addressed This Visit   None Visit Diagnoses     Vaginal candidiasis    -  Primary   Relevant Medications   fluconazole (DIFLUCAN) 150 MG tablet   Vaginal irritation       Relevant Orders   WET PREP FOR TRICH, YEAST, CLUE   Chronic vulvitis           Plan: Wet prep positive for yeast - Diflucan 150 mg today and repeat dose in 3 days if symptoms persist for total of 2 doses. We again discussed management options for chronic vulvitis (excision versus corticosteroid injection). She is interested in the steroid injection but does not want it right now since she is going to the beach this week. She will keep [place] updated and schedule when she is ready. She will continue Clobetasol and lidocaine. She is agreeable to plan.     [place], 4:16 PM  02/20/2021

## 2021-02-21 ENCOUNTER — Ambulatory Visit: Payer: Medicare Other | Admitting: Sports Medicine

## 2021-02-21 DIAGNOSIS — M79604 Pain in right leg: Secondary | ICD-10-CM | POA: Diagnosis not present

## 2021-02-21 MED ORDER — METHYLPREDNISOLONE ACETATE 80 MG/ML IJ SUSP
80.0000 mg | Freq: Once | INTRAMUSCULAR | Status: AC
  Administered 2021-02-21: 80 mg via INTRAMUSCULAR

## 2021-02-21 NOTE — Progress Notes (Signed)
    SUBJECTIVE:   CHIEF COMPLAINT / HPI:   Right leg pain Ms. Zechman is a very pleasant 86 year old coming in today for right leg pain located over the lateral thigh.  It is located more in the mid thigh and radiates down to the knee but does not go past the knee.  Is been ongoing for about a month with no known acute injury or inciting event.  She has had no falls.  She says she has no knee pain or instability and feels like the pain originates from the mid thigh region.  She is not having any pain over the lateral right hip or in the groin region.  Seems to get worse with activity such as sitting down for long period of time and then getting up.  She denies any numbness or tingling describes it is just his pain.  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  Hip, Right: No obvious rash, erythema, ecchymosis, or edema. ROM full in all directions; Strength 5/5 in IR/ER/Flex/Ext/Abd/Add. Pelvic alignment unremarkable to inspection and palpation. Greater trochanter without tenderness to palpation. No tenderness over piriformis. No SI joint tenderness and normal minimal SI movement. Knee, Right: Inspection was negative for erythema, ecchymosis, and effusion. No obvious bony abnormalities or signs of osteophyte development. Palpation yielded no asymmetric warmth; No joint line tenderness; Patellar and quadriceps tendons unremarkable, and no tenderness of the pes anserine bursa. No obvious Baker's cyst development. ROM normal in flexion (135 degrees) and extension (0 degrees). Normal hamstring and quadriceps strength. Neurovascularly intact bilaterally. Special Tests  - Cruciate Ligaments:   - Anterior Drawer:  NEG - Posterior Drawer: NEG  - Collateral Ligaments:   - Varus/Valgus Stress  test: NEG  - Meniscus:   - McMurray's: NEG  - IT Band:   - Ober's test: Positive for tightness but did not reproduce her pain   ASSESSMENT/PLAN:   Right leg pain Patient with right leg pain that could be due to some nerve impingement from L1-L2 nerve roots.  We will begin with conservative management and gave her a milligram Depo-Medrol shot intramuscular today.  She has a [place] trip coming up and hopefully this will give her relief.  She did have tightness on the IT band so we will give her stretching exercises to do at home however this does not seem to be the main source of her pain.  She will follow up with [place] as needed.     Nuala Alpha, DO PGY-4, Sports Medicine Fellow [place]  Patient seen and evaluated with the sports medicine fellow.  I agree with the above plan of care.  I think the patient's pain is referred pain from her lumbar spine.  We will treat her with a simple IM Depo-Medrol injection today.  Follow-up for ongoing or recalcitrant issues.

## 2021-02-21 NOTE — Assessment & Plan Note (Signed)
Patient with right leg pain that could be due to some nerve impingement from L1-L2 nerve roots.  We will begin with conservative management and gave her a milligram Depo-Medrol shot intramuscular today.  She has a [place] trip coming up and hopefully this will give her relief.  She did have tightness on the IT band so we will give her stretching exercises to do at home however this does not seem to be the main source of her pain.  She will follow up with [place] as needed.

## 2021-03-23 ENCOUNTER — Other Ambulatory Visit: Payer: Self-pay | Admitting: Interventional Cardiology

## 2021-04-01 ENCOUNTER — Other Ambulatory Visit: Payer: Self-pay | Admitting: Interventional Cardiology

## 2021-04-06 ENCOUNTER — Telehealth: Payer: Self-pay | Admitting: Internal Medicine

## 2021-04-06 NOTE — Chronic Care Management (AMB) (Signed)
  Chronic Care Management   Outreach Note  04/06/2021 Name: Hailey [name] MRN: [id number] DOB: [birth year]  Referred by: Cassandria Anger, MD Reason for referral : No chief complaint on file.   An unsuccessful telephone outreach was attempted today. The patient was referred to the pharmacist for assistance with care management and care coordination.   Follow Up Plan:   Lauretta Grill Upstream Scheduler

## 2021-04-06 NOTE — Chronic Care Management (AMB) (Signed)
  Chronic Care Management   Note  04/06/2021 Name: CHONG CAMILLERI MRN: [id number] DOB: June 19, 1935  ADARIA HUNSLEY is a 86 y.o. year old female who is a primary care patient of Plotnikov, Evie Lacks, MD. I reached out to [name] by phone today in response to a referral sent by Ms. Kimbrely S Duffin's PCP, Plotnikov, Evie Lacks, MD.   Ms. Welle was given information about Chronic Care Management services today including:  CCM service includes personalized support from designated clinical staff supervised by her physician, including individualized plan of care and coordination with other care providers 24/7 contact phone numbers for assistance for urgent and routine care needs. Service will only be billed when office clinical staff spend 20 minutes or more in a month to coordinate care. Only one practitioner may furnish and bill the service in a calendar month. The patient may stop CCM services at any time (effective at the end of the month) by phone call to the office staff.   Patient agreed to services and verbal consent obtained.   Follow up plan:   Lauretta Grill Upstream Scheduler

## 2021-04-07 DIAGNOSIS — Z7901 Long term (current) use of anticoagulants: Secondary | ICD-10-CM | POA: Diagnosis not present

## 2021-04-07 DIAGNOSIS — I4891 Unspecified atrial fibrillation: Secondary | ICD-10-CM | POA: Diagnosis not present

## 2021-04-07 DIAGNOSIS — W1830XA Fall on same level, unspecified, initial encounter: Secondary | ICD-10-CM | POA: Diagnosis not present

## 2021-04-07 DIAGNOSIS — S80812A Abrasion, left lower leg, initial encounter: Secondary | ICD-10-CM | POA: Diagnosis not present

## 2021-04-07 DIAGNOSIS — Z87891 Personal history of nicotine dependence: Secondary | ICD-10-CM | POA: Diagnosis not present

## 2021-04-07 DIAGNOSIS — I10 Essential (primary) hypertension: Secondary | ICD-10-CM | POA: Diagnosis not present

## 2021-04-12 ENCOUNTER — Telehealth: Payer: Self-pay

## 2021-04-12 NOTE — Telephone Encounter (Signed)
Patient called to discuss "the same problem". She said she is not better with the Clobetasol at least twice daily and "it hurts a lot".  [place] She asked if anything else you can prescribe.

## 2021-04-14 DIAGNOSIS — S80812A Abrasion, left lower leg, initial encounter: Secondary | ICD-10-CM | POA: Diagnosis not present

## 2021-04-14 DIAGNOSIS — Z7901 Long term (current) use of anticoagulants: Secondary | ICD-10-CM | POA: Diagnosis not present

## 2021-04-14 DIAGNOSIS — S80812D Abrasion, left lower leg, subsequent encounter: Secondary | ICD-10-CM | POA: Diagnosis not present

## 2021-04-14 DIAGNOSIS — I10 Essential (primary) hypertension: Secondary | ICD-10-CM | POA: Diagnosis not present

## 2021-04-14 DIAGNOSIS — I4891 Unspecified atrial fibrillation: Secondary | ICD-10-CM | POA: Diagnosis not present

## 2021-04-17 NOTE — Telephone Encounter (Signed)
The ointment she is on is a high dose steroid, so it is best if she consider one of the other treatments we have discussed in the past for management of her symptoms.

## 2021-04-17 NOTE — Telephone Encounter (Signed)
Spoke with patient and advised

## 2021-05-11 DIAGNOSIS — N900 Mild vulvar dysplasia: Secondary | ICD-10-CM

## 2021-05-11 HISTORY — DX: Mild vulvar dysplasia: N90.0

## 2021-05-16 ENCOUNTER — Telehealth: Payer: Self-pay

## 2021-05-16 NOTE — Chronic Care Management (AMB) (Signed)
Chronic Care Management Pharmacy Assistant   Name: MARGURITE MCNICHOL  MRN: [id number] DOB: [birth year]  Hailey [name] is an 86 y.o. year old female who presents for his initial CCM visit with the clinical pharmacist.   Recent office visits:  01/16/21 Evie Lacks Plotnikov MD - Seen for Paresthesia - Labs ordered - Per provider, Holding Liptor for a couple of weeks due to abdominal pain  - Follow up in 2 months  01/03/21 Evie Lacks Plotnikov MD - Seen for Diverticulitis -  Start Cefuroxime Axetil, Take 1 tablet (500 mg total) by mouth 2 (two) times daily with a meal for 10 days., Starting Tue 01/03/2021, Until Fri 01/13/2021, and start metronidazole Take 1 tablet (500 mg total) by mouth 3 (three) times daily., Starting Tue 01/03/2021, Until Mon 01/16/2021 - No follow up noted  Recent consult visits:  02/21/21 Carlos Levering draper DO - Sports medicine - Seen for right leg pain - Depo-Medrol shot given intramuscular - No follow up noted 02/20/21 Tamela Gammon NP - Gynecology - Seen for Vaginal Candidiasis - Started Diflucan 150 mg today and repeat dose in 3 days if symptoms persist for total of 2 doses. - No follow up noted.  02/14/21 Scarlette Shorts MD - Gastroenterology - Seen for IBS - No medication changes and no follow up noted.  01/18/21 Tamela Gammon NP - Gynecology - Seen for routine exam - No medication changes noted -  Follow up in 2 years  12/21/20 Tamela Gammon NP - Gynecology - Seen for chronic Vulvitis - Start Lidocaine 5% PRN, and Start Clobetasol 0.05% - follow up in 3-4 weeks  11/28/20 Darleen Crocker - [facility] for age related nuclear cataract -No notes available  11/18/20 Jari Pigg - Dermatology - Seen for Ulcer - No notes available  11/15/20 Darleen Crocker - [facility] for age related nuclear cataract -No notes available  11/14/20 Darleen Crocker - [facility] for age related nuclear cataract -No notes available     Hospital visits:  None in previous 6 months  Medications: Outpatient Encounter Medications as of 05/16/2021  Medication Sig   acetaminophen (TYLENOL) 500 MG tablet Take 1,000 mg by mouth every 6 (six) hours as needed (body aches / sore hip).   atorvastatin (LIPITOR) 80 MG tablet TAKE 1 TABLET DAILY AT 6PM.   BIOTIN PO Take by mouth.   cetirizine (ZYRTEC) 10 MG tablet Take 10 mg by mouth daily as needed for allergies.   clobetasol ointment (TEMOVATE) [id number] % Apply 1 application topically 2 (two) times daily.   DULoxetine (CYMBALTA) 60 MG capsule TAKE 1 CAPSULE DAILY.   ELIQUIS 2.5 MG TABS tablet TAKE 1 TABLET BY MOUTH TWICE DAILY.   esomeprazole (NEXIUM) 40 MG capsule TAKE (1) CAPSULE DAILY.   fluconazole (DIFLUCAN) 150 MG tablet Take 1 tablet (150 mg total) by mouth every 3 (three) days.   hyoscyamine (LEVSIN) 0.125 MG tablet TAKE 1-2 TABLETS (0.125-0.25 MG) BY MOUTH EVERY 4 HOURS AS NEEDED FOR UP TO 10 DAYS FOR CRAMPING. (Patient taking differently: Take 0.125-0.25 mg by mouth every 4 (four) hours as needed for cramping.)   LORazepam (ATIVAN) 1 MG tablet TAKE 1 TABLET TWICE DAILY AS NEEDED FOR ANXIETY/SLEEP.   losartan (COZAAR) 25 MG tablet Take 1 tablet (25 mg total) by mouth daily.   metoprolol succinate (TOPROL-XL) 25 MG 24 hr tablet TAKE 1 TABLET ONCE DAILY.   nitroGLYCERIN (NITROSTAT)  0.4 MG SL tablet Place 0.4 mg under the tongue every 5 (five) minutes as needed for chest pain (Call 911 at 3rd dose within 15 minutes.).   ondansetron (ZOFRAN-ODT) 4 MG disintegrating tablet DISSOLVE 1 TABLET IN MOUTH EVERY 8 HOURS AS NEEDED FOR NAUSEA OR VOMITING.   saccharomyces boulardii (FLORASTOR) 250 MG capsule Take 1 capsule (250 mg total) by mouth 2 (two) times daily.   sodium chloride (OCEAN) 0.65 % SOLN nasal spray Place 1 spray into both nostrils as needed for congestion.   No facility-administered encounter medications on file as of 05/16/2021.   acetaminophen (TYLENOL) 300 MG - Codeine 15  mg tablet - Last filled 01/03/21 5 DS atorvastatin (LIPITOR) 80 MG tablet - Last filled 01/11/21 90 DS clobetasol ointment (TEMOVATE) 0.05 % - Last filled 12/21/20 15 DS DULoxetine (CYMBALTA) 60 MG capsule - Last filled 03/21/21 90 DS ELIQUIS 2.5 MG TABS tablet - Last filled 02/13/21 90 DS esomeprazole (NEXIUM) 40 MG capsule - Last filled 04/17/21 90 DS fluconazole (DIFLUCAN) 150 MG tablet - Last filled 02/20/21 3 DS hyoscyamine (LEVSIN) 0.125 MG tablet - Last filled 03/27/20 10 DS LORazepam (ATIVAN) 1 MG tablet - Last filled 03/21/21 90 DS losartan (COZAAR) 25 MG tablet - Last filled 04/17/21 90 DS metoprolol succinate (TOPROL-XL) 25 MG 24 hr tablet - Last filled 03/23/21 90 DS nitroGLYCERIN (NITROSTAT) 0.4 MG SL tablet - Last filled unknown ondansetron (ZOFRAN-ODT) 4 MG disintegrating tablet - Last filled 02/16/21 7 DS saccharomyces boulardii (FLORASTOR) 250 MG capsule - Last filled unknown  sodium chloride (OCEAN) 0.65 % SOLN nasal spray - Last filled unknown    Star Rating Drugs: atorvastatin (LIPITOR) 80 MG tablet - Last filled 01/11/21 90 DS losartan (COZAAR) 25 MG tablet - Last filled 04/17/21 90 DS metoprolol succinate (TOPROL-XL) 25 MG 24 hr tablet - Last filled 03/23/21 90 DS   Andee Poles, CMA

## 2021-05-16 NOTE — Telephone Encounter (Signed)
Patient called today stating she was calling about "the same problem".  She said she would like to speak with Marny Lowenstein this time and asked if you would call her?

## 2021-05-17 ENCOUNTER — Telehealth: Payer: Self-pay | Admitting: Nurse Practitioner

## 2021-05-17 ENCOUNTER — Encounter: Payer: Self-pay | Admitting: Obstetrics and Gynecology

## 2021-05-17 ENCOUNTER — Ambulatory Visit: Payer: Medicare Other | Admitting: Obstetrics and Gynecology

## 2021-05-17 ENCOUNTER — Other Ambulatory Visit: Payer: Self-pay

## 2021-05-17 VITALS — BP 136/84 | HR 76 | Ht 62.75 in | Wt 143.0 lb

## 2021-05-17 DIAGNOSIS — N763 Subacute and chronic vulvitis: Secondary | ICD-10-CM

## 2021-05-17 DIAGNOSIS — L900 Lichen sclerosus et atrophicus: Secondary | ICD-10-CM | POA: Diagnosis not present

## 2021-05-17 DIAGNOSIS — N9089 Other specified noninflammatory disorders of vulva and perineum: Secondary | ICD-10-CM | POA: Diagnosis not present

## 2021-05-17 MED ORDER — CEPHALEXIN 500 MG PO CAPS: 500.0000 mg | ORAL_CAPSULE | Freq: Two times a day (BID) | ORAL | 0 refills | Status: DC

## 2021-05-17 NOTE — Progress Notes (Signed)
GYNECOLOGY  VISIT   HPI: 86 y.o.   Married  Caucasian  female   G2P2001 with No LMP recorded. Patient has had a hysterectomy.   here for vulvar irritation for years.  She has a diagnosis of lichen sclerosus.   She states she is having vulvar bleeding.   She feels a pulling in the vagina.  The vulvar area can burn.   Vulvar biopsy with Dr. Delilah Shan 07/28/20 showing benign skin with trace chronic inflammation. No dysplasia or malignancy.  Hx HSV.   She has been using clobetasol ointment and lidocaine 5% ointment.  Using clobetasol once daily. The lidocaine burns.  The clobetasol is not helpful.   Urine burns when it touches the skin.   Does not use protective pads.  Tight pants makes it worse.   Occasionally the vulva is irritated spontaneously.  Not sexually active.   GYNECOLOGIC HISTORY: No LMP recorded. Patient has had a hysterectomy. Contraception: Hyst Menopausal hormone therapy: none Last mammogram: 01-18-21 3D/Neg/BiRads1 Last pap smear:  Years ago        OB History     Gravida  2   Para  2   Term  2   Preterm      AB      Living  1      SAB      IAB      Ectopic      Multiple      Live Births                 Patient Active Problem List   Diagnosis Date Noted   Right leg pain 02/21/2021   Diverticulitis 01/08/2021   Acute respiratory failure with hypoxia (HCC)    Bacterial colitis    Abnormal CT of the abdomen    Diarrhea of infectious origin    Acute colitis 08/27/2020   Aortic atherosclerosis ([place]) 08/27/2020   Acute prerenal azotemia 08/27/2020   Shock circulatory ([place]) [id number]   Metabolic acidosis with normal anion gap and bicarbonate losses 08/27/2020   Dehydration    Squamous cell cancer of skin of left cheek [id number]   Lichen sclerosus [id number]   Abdominal cramping 03/16/2020   Neurogenic orthostatic hypotension ([place]) 09/16/2019   Injury of face 08/31/2019   Leg abrasion, infected, right, subsequent encounter  08/28/2019   Face lacerations 08/26/2019   Nose fracture 08/26/2019   Concussion 08/26/2019   Wrist pain, acute, right 08/26/2019   Greater trochanteric pain syndrome of right lower extremity 07/30/2019   Viral URI with cough 08/15/2018   Abdominal pain 05/22/2018   Traumatic hematoma of right knee 01/10/2018   Ulcer of right knee (HCC)    Bradycardia with 41-50 beats per minute 12/27/2017   Right bundle branch block (RBBB) on electrocardiogram (ECG) 12/27/2017   Quadriceps tendon rupture 12/24/2017   CKD (chronic kidney disease), stage II 12/24/2017   Cellulitis of leg, right with large prepatella hematoma and open wounds 12/24/2017   Iliotibial band syndrome of right side 12/16/2017   Depression 07/11/2017   Intervertebral lumbar disc disorder with myelopathy, lumbar region 04/25/2017   Family history of colon cancer in mother 01/22/2017   Primary osteoarthritis of both first carpometacarpal joints 01/02/2017   Pain 11/21/2016   Gastroenteritis 09/13/2016   Chronic anticoagulation 05/24/2015   Coronary artery disease involving native coronary artery of native heart with angina pectoris ([place]) 12/20/2014   Iliotibial band syndrome of left side 11/16/2014   Diverticulitis of colon 08/30/2014   Chest pain,  atypical 07/03/2014   Diarrhea 12/22/2013   PAF (paroxysmal atrial fibrillation) ([place]) 11/21/2012   Greater trochanteric bursitis of both hips 05/21/2012   Dizzinesses 11/07/2011   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   Osteoarthritis of left hip 07/03/2010   DEGENERATIVE [place] DISEASE, LUMBOSACRAL SPINE 05/18/2010   SYNCOPE 10/27/2008   Essential hypertension 06/16/2007   GERD (gastroesophageal reflux disease) 06/16/2007   COLONIC POLYPS, HX OF 06/16/2007    Past Medical History:  Diagnosis Date   Acute blood loss anemia 12/25/2017   Allergic rhinitis    Anxiety    Arthritis    "my whole spine" (07/01/2017)   Atrial fibrillation (HCC)    Bowel obstruction ([place])    in  [place]   Bradycardia with 41-50 beats per minute 12/27/2017   Cancer ([place])    Cellulitis of leg, right with large prepatella hematoma and open wounds 12/26/2017   CKD (chronic kidney disease) stage 2, GFR 60-89 ml/min    Colon polyps    Coronary artery disease    10/18 PCI/DES to p/m LCx with cutting balloon to mLcx   Diverticulosis of colon    GERD (gastroesophageal reflux disease)    Hip bursitis 2010   Dr Para March, Post op seroma   History of colon polyps    HSV (herpes simplex virus) anogenital infection 07/2019   HTN (hypertension)    IBS (irritable bowel syndrome)    constipation predominant - Dr Earlean Shawl   Lichen sclerosus    Osteopenia 11/2016   T score -2.0 FRAX 15%/4.3%   PAC (premature atrial contraction)    Symptomatiic   Renal insufficiency    Right bundle branch block (RBBB) on electrocardiogram (ECG) 12/27/2017   Scoliosis    SVT (supraventricular tachycardia) ([place])    brief history    Past Surgical History:  Procedure Laterality Date   ANTERIOR AND POSTERIOR VAGINAL REPAIR  01/2002   Archie Endo 01/23/2011   APPENDECTOMY  1948   CARDIAC CATHETERIZATION  06/26/2017   CORONARY ANGIOPLASTY WITH STENT PLACEMENT  07/01/2017   CORONARY ATHERECTOMY N/A 07/01/2017   Procedure: CORONARY ATHERECTOMY;  Surgeon: Belva Crome, MD;  Location: [facility];  Service: Cardiovascular;  Laterality: N/A;   CORONARY STENT INTERVENTION N/A 07/01/2017   Procedure: CORONARY STENT INTERVENTION;  Surgeon: Belva Crome, MD;  Location: [facility];  Service: Cardiovascular;  Laterality: N/A;   HAMMER TOE SURGERY     HEMORRHOID BANDING     HIP SURGERY Left 04/2009   hip examination under anesthesia followed by greater trochanteric bursectomy; iliotibial band tenotomy/notes 01/20/2011   I & D EXTREMITY Right 01/10/2018   Procedure: IRRIGATION AND DEBRIDEMENT RIGHT KNEE, APPLY WOUND VAC;  Surgeon: Newt Minion, MD;  Location: [facility];  Service: Orthopedics;  Laterality: Right;    KNEE BURSECTOMY Right 04/2009   Archie Endo 01/09/2011   LEFT HEART CATH AND CORONARY ANGIOGRAPHY N/A 06/26/2017   Procedure: LEFT HEART CATH AND CORONARY ANGIOGRAPHY;  Surgeon: Belva Crome, MD;  Location: [facility];  Service: Cardiovascular;  Laterality: N/A;   PUBOVAGINAL SLING  01/2002   Archie Endo 01/23/2011   REDUCTION MAMMAPLASTY     TEMPORARY PACEMAKER N/A 07/01/2017   Procedure: TEMPORARY PACEMAKER;  Surgeon: Belva Crome, MD;  Location: [facility];  Service: Cardiovascular;  Laterality: N/A;   VAGINAL HYSTERECTOMY  01/2002   Vaginal hysterectomy, bilateral salpingo-oophorectomy/notes 01/23/2011    Current Outpatient Medications  Medication Sig Dispense Refill   acetaminophen (TYLENOL) 500 MG tablet  Take 1,000 mg by mouth every 6 (six) hours as needed (body aches / sore hip).     atorvastatin (LIPITOR) 80 MG tablet TAKE 1 TABLET DAILY AT 6PM. 90 tablet 0   BIOTIN PO Take by mouth.     clobetasol ointment (TEMOVATE) [id number] % Apply 1 application topically 2 (two) times daily. 30 g 0   DULoxetine (CYMBALTA) 60 MG capsule TAKE 1 CAPSULE DAILY. 90 capsule 1   ELIQUIS 2.5 MG TABS tablet TAKE 1 TABLET BY MOUTH TWICE DAILY. 180 tablet 1   esomeprazole (NEXIUM) 40 MG capsule TAKE (1) CAPSULE DAILY. 90 capsule 1   hyoscyamine (LEVSIN) 0.125 MG tablet TAKE 1-2 TABLETS (0.125-0.25 MG) BY MOUTH EVERY 4 HOURS AS NEEDED FOR UP TO 10 DAYS FOR CRAMPING. (Patient taking differently: Take 0.125-0.25 mg by mouth every 4 (four) hours as needed for cramping.) 100 tablet 1   LORazepam (ATIVAN) 1 MG tablet TAKE 1 TABLET TWICE DAILY AS NEEDED FOR ANXIETY/SLEEP. 180 tablet 1   losartan (COZAAR) 25 MG tablet Take 1 tablet (25 mg total) by mouth daily. 90 tablet 3   metoprolol succinate (TOPROL-XL) 25 MG 24 hr tablet TAKE 1 TABLET ONCE DAILY. 90 tablet 0   nitroGLYCERIN (NITROSTAT) 0.4 MG SL tablet Place 0.4 mg under the tongue every 5 (five) minutes as needed for chest pain (Call 911 at 3rd dose within  15 minutes.).     ondansetron (ZOFRAN-ODT) 4 MG disintegrating tablet DISSOLVE 1 TABLET IN MOUTH EVERY 8 HOURS AS NEEDED FOR NAUSEA OR VOMITING. 20 tablet 0   saccharomyces boulardii (FLORASTOR) 250 MG capsule Take 1 capsule (250 mg total) by mouth 2 (two) times daily. 60 capsule 2   sodium chloride (OCEAN) 0.65 % SOLN nasal spray Place 1 spray into both nostrils as needed for congestion.     No current facility-administered medications for this visit.     ALLERGIES: Macrobid [nitrofurantoin monohyd macro], Meloxicam, Digoxin and related, Mobic [meloxicam], and Sulfamethoxazole-trimethoprim  Family History  Problem Relation Age of Onset   Colon cancer Mother        Dx age 12, died at age 56   Diabetes Father    Prostate cancer Father    Prostate cancer Brother    Pancreatic cancer Brother    Stomach cancer Son    Heart attack Neg Hx    Stroke Neg Hx    Esophageal cancer Neg Hx     Social History   Socioeconomic History   Marital status: Married    Spouse name: Not on file   Number of children: 2   Years of education: Not on file   Highest education level: Not on file  Occupational History   Occupation: Patent attorney: RETIRED  Tobacco Use   Smoking status: Former    Packs/day: 0.25    Years: 28.00    Pack years: 7.00    Types: Cigarettes    Quit date: 1981    Years since quitting: 41.7   Smokeless tobacco: Never  Vaping Use   Vaping Use: Never used  Substance and Sexual Activity   Alcohol use: Yes    Comment: 7 vodka drinks a week   Drug use: Never   Sexual activity: Not Currently    Birth control/protection: Surgical    Comment: hysterectomy  Other Topics Concern   Not on file  Social History Narrative   Regular Exercise -  YES         Social Determinants of Health  Financial Resource Strain: Low Risk    Difficulty of Paying Living Expenses: Not hard at all  Food Insecurity: No Food Insecurity   Worried About Charity fundraiser in the Last  Year: Never true   Ran Out of Food in the Last Year: Never true  Transportation Needs: No Transportation Needs   Lack of Transportation (Medical): No   Lack of Transportation (Non-Medical): No  Physical Activity: Inactive   Days of Exercise per Week: 0 days   Minutes of Exercise per Session: 0 min  Stress: No Stress Concern Present   Feeling of Stress : Not at all  Social Connections: Socially Integrated   Frequency of Communication with Friends and Family: More than three times a week   Frequency of Social Gatherings with Friends and Family: Twice a week   Attends Religious Services: More than 4 times per year   Active Member of [employer] or Organizations: No   Attends Music therapist: More than 4 times per year   Marital Status: Married  Human resources officer Violence: Not At Risk   Fear of Current or Ex-Partner: No   Emotionally Abused: No   Physically Abused: No   Sexually Abused: No    Review of Systems  All other systems reviewed and are negative.  PHYSICAL EXAMINATION:    BP 136/84   Pulse 76   Ht 5' 2.75" (1.594 m)   Wt 143 lb (64.9 kg)   BMI 25.53 kg/m     General appearance: alert, cooperative and appears stated age    Pelvic: External genitalia:  ulceration and erythema of left labia majora.                Urethra:  normal appearing urethra with no masses, tenderness or lesions              Bartholins and Skenes: normal                 Vagina: normal appearing vagina with normal color and discharge, no lesions              Cervix: absent                Bimanual Exam:  Uterus:  absent              Adnexa: no mass, fullness, tenderness              Rectal exam: yes.  Confirms.              Anus:  normal sphincter tone, no lesions  Chaperone was present for exam:  Estill Bamberg.   ASSESSMENT  Chronic left labial lesion and vulvitis. Hx lichen sclerosus. Positive HSV I PCR.  Status post TVH/BSO. Factor XI deficiency.  Atrial fibrillation.  On Eliquis.   On Cymbalta, originally prescribed for pain in her back and hips.    Cr. 1.17.  PLAN  Wound culture for bacteria and HSV swab. Stop using clobetasol and lidocaine.  Keflex 500 mg po bid x 1 week.  Return vulvar biopsy.  Consider consultation with Dr. Buddy Duty at [facility].   An After Visit Summary was printed and given to the patient.  34 min  total time was spent for this patient encounter, including preparation, face-to-face counseling with the patient, coordination of care, and documentation of the encounter.

## 2021-05-18 ENCOUNTER — Ambulatory Visit (INDEPENDENT_AMBULATORY_CARE_PROVIDER_SITE_OTHER): Payer: Medicare Other

## 2021-05-18 DIAGNOSIS — K219 Gastro-esophageal reflux disease without esophagitis: Secondary | ICD-10-CM

## 2021-05-18 DIAGNOSIS — I480 Paroxysmal atrial fibrillation: Secondary | ICD-10-CM

## 2021-05-18 DIAGNOSIS — M1612 Unilateral primary osteoarthritis, left hip: Secondary | ICD-10-CM

## 2021-05-18 DIAGNOSIS — I10 Essential (primary) hypertension: Secondary | ICD-10-CM

## 2021-05-18 DIAGNOSIS — I25119 Atherosclerotic heart disease of native coronary artery with unspecified angina pectoris: Secondary | ICD-10-CM

## 2021-05-18 NOTE — Patient Instructions (Signed)
CZARINA GINGRAS,  It was great to talk to you today!  Please call me with any questions or concerns.  Visit Information   PATIENT GOALS:   Goals Addressed             This Visit's Progress    Track and Manage My Blood Pressure-Hypertension       Timeframe:  Long-Range Goal Priority:  High Start Date:  05/18/2021                           Expected End Date:  11/15/2021                    Follow Up Date 08/17/2021   - check blood pressure weekly - choose a place to take my blood pressure (home, clinic or office, retail store) - write blood pressure results in a log or diary    Why is this important?   You won't feel high blood pressure, but it can still hurt your blood vessels.  High blood pressure can cause heart or kidney problems. It can also cause a stroke.  Making lifestyle changes like losing a little weight or eating less salt will help.  Checking your blood pressure at home and at different times of the day can help to control blood pressure.  If the doctor prescribes medicine remember to take it the way the doctor ordered.  Call the office if you cannot afford the medicine or if there are questions about it.           Consent to CCM Services: Ms. Nebel was given information about Chronic Care Management services including:  CCM service includes personalized support from designated clinical staff supervised by her physician, including individualized plan of care and coordination with other care providers 24/7 contact phone numbers for assistance for urgent and routine care needs. Service will only be billed when office clinical staff spend 20 minutes or more in a month to coordinate care. Only one practitioner may furnish and bill the service in a calendar month. The patient may stop CCM services at any time (effective at the end of the month) by phone call to the office staff. The patient will be responsible for cost sharing (co-pay) of up to 20% of the service fee  (after annual deductible is met).  Patient agreed to services and verbal consent obtained.   Patient verbalizes understanding of instructions provided today and agrees to view in [place].   Telephone follow up appointment with care management team member scheduled for: 3 months The patient has been provided with contact information for the care management team and has been advised to call with any health related questions or concerns.   Tomasa Blase, PharmD Clinical Pharmacist, [facility]    CLINICAL CARE PLAN: Patient Care Plan: CCM Care Plan     Problem Identified: HTN, HLD, AFib, CAD, GERD, Gastritis, Insomnia   Priority: High  Onset Date: 05/18/2021     Long-Range Goal: Disease Management   Start Date: 05/18/2021  Expected End Date: 11/15/2021  This Visit's Progress: On track  Priority: High  Note:   Current Barriers:  Unable to independently monitor therapeutic efficacy  Pharmacist Clinical Goal(s):  Patient will achieve adherence to monitoring guidelines and medication adherence to achieve therapeutic efficacy achieve improvement in LDL as evidenced by next lipid panel through collaboration with PharmD and provider.   Interventions: 1:1 collaboration with Plotnikov, Evie Lacks, MD  regarding development and update of comprehensive plan of care as evidenced by provider attestation and co-signature Inter-disciplinary care team collaboration (see longitudinal plan of care) Comprehensive medication review performed; medication list updated in electronic medical record  Hypertension (BP goal <140/90) -Controlled -Current treatment: Losartan 37m - 1 tablet daily  Metoprolol Succinate 277m- 1 tablet daily  -Medications previously tried: carvedilol,  furosemide,  -Current home readings: n/a BP Readings from Last 3 Encounters:  05/17/21 136/84  02/21/21 (!) 145/92  02/20/21 130/78  -Current dietary habits: reports that she lightly salts certain foods, does not  watch sodium intake, drinks decaffeinated coffee, drinks iced tea 10oz daily -Current exercise habits: previously had been working out with trainer 2-3 times weekly - has fallen out of practice with this recently  -Denies hypotensive/hypertensive symptoms -Educated on BP goals and benefits of medications for prevention of heart attack, stroke and kidney damage; Daily salt intake goal < 2300 mg; Exercise goal of 150 minutes per week; Importance of home blood pressure monitoring; Proper BP monitoring technique; -Counseled to monitor BP at home weekly, document, and provide log at future appointments -Counseled on diet and exercise extensively Recommended to continue current medication  Hyperlipidemia/ CAD : (LDL goal < 70) -Controlled Lab Results  Component Value Date   LDLCALC 61 11/26/2016  -Current treatment: Atorvastatin 8022m 1 tablet daily - has not restarted since being advised to stop for 2 weeks by PCP 01/2021 Nitroglycerin 0.4mg64m1 tablet every 5 minutes as needed for chest pain -Medications previously tried: pravastatin   -Current dietary patterns: notes that she does not typically eat large amount of foods high in cholesterol -Current exercise habits: previously had been working out with trainer 2-3 times weekly - has fallen out of practice with this recently  -Educated on Cholesterol goals;  Benefits of statin for ASCVD risk reduction; Importance of limiting foods high in cholesterol; Exercise goal of 150 minutes per week; -Counseled on diet and exercise extensively Recommended to restart atorvastatin at 40mg6mly - patient to have updated Lipid panel with next PCP visit   Atrial Fibrillation (Goal: prevent stroke and major bleeding) -Controlled -CHADSVASC: at least 4  -Current treatment: Rate control: Metoprolol Succinate 25mg 28mtablet daily  Anticoagulation: Eliquis 2.5mg - 75mablet twice daily - 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 - started 2020 -Medications previously tried: digoxin, clopidogrel  -Home BP and HR readings: patient reports that she has not been monitoring, does have BP cuff at home   -Counseled on increased risk of stroke due to Afib and benefits of anticoagulation for stroke prevention; importance of adherence to anticoagulant exactly as prescribed; bleeding risk associated with Eliquis and importance of self-monitoring for signs/symptoms of bleeding; avoidance of NSAIDs due to increased bleeding risk with anticoagulants; importance of regular laboratory monitoring; seeking medical attention after a head injury or if there is blood in the urine/stool; -Recommended to continue current medication  Chronic back and hip pain (Goal: pain control) -Controlled -Current treatment  Duloxetine 60mg - 125msule daily  Diclofenac 1% gel - 2g applied 4 times daily as needed  Acetaminophen 500mg - 2 76mets every 6 hours as needed (maximum of 6 tablets daily) -Medications previously tried: tramadol, norco, meloxicam, percocet -Counseled on diet and exercise extensively Recommended to continue current medication Counseled on not exceeding 3000mg of ac47minophen daily, and not using PO NSAIDs due to bleeding risk as she is taking eliquis  Insomnia (Goal: Promotion of quality sleep) -Controlled -Current treatment: Lorazepam 30m - 1 tablet daily as needed  -Medications previously tried/failed: trazodone -Recommended to continue current medication  GERD/ Gastritis (Goal: Prevention of acid reflux/ abdominal pains) -Controlled -Current treatment  Esomeprazole 457m- 1 capsule daily  Hyoscyamine 0.12585m 1-2 tablets every 4 hours as needed  Ondansetron 4mg60mT tablets - 1 tablet every 8 hours as needed  -Medications previously tried: pantoprazole, dicyclomine  -Recommended to continue current medication  Health Maintenance -Vaccine gaps: Shingles vaccinations, influenza vaccine -Current  therapy:  Biotin 5000mc44m1 capsule daily  Sodium Chloride 0.65% nasal solution - 1 spray into each nostril as needed  Florastor 250mg 29mcapsule daily Fluticasone 50mcg/60m- 2 spays into each nostril daily as needed  Arnicare gel - applied to bruises daily as needed  -Educated on Cost vs benefit of each product must be carefully weighed by individual consumer Supplements may interfere with prescription drugs -Patient is satisfied with current therapy and denies issues -Recommended to continue current medication  Patient Goals/Self-Care Activities Patient will:  - take medications as prescribed check blood pressure at least once weekly, document, and provide at future appointments target a minimum of 150 minutes of moderate intensity exercise weekly engage in dietary modifications by reducing sodium intake   Follow Up Plan: Telephone follow up appointment with care management team member scheduled for: 3 months The patient has been provided with contact information for the care management team and has been advised to call with any health related questions or concerns.

## 2021-05-18 NOTE — Telephone Encounter (Signed)
Spoke with patient about ongoing problem and she did see Dr. Quincy Simmonds yesterday. So we discussed that plan and will follow up as needed.

## 2021-05-18 NOTE — Progress Notes (Addendum)
Chronic Care Management Pharmacy Note  05/18/2021 Name:  Hailey [name] MRN:  [id number] DOB:  12-12-1934  Summary: - Patient notes that she has not yet restarted atorvastatin since being advised to hold for 2 weeks with last PCP appointment (01/16/2021) due to abdominal pain, notes that she has not seen any difference in stomach pains since stopping  -Reports that she had a fall over summer (03/2021) [place] which she had trouble healing so she had to go to ED while in [facility] - otherwise no issues with bruising or bleeding from eliquis  -Has blood pressure cuff at home, notes that she has not been checking as of late, is agreeable to start checking  -Using duloxetine, APAP, and voltaren gel for back and hip pain with success  Recommendations/Changes made from today's visit: - Recommending for patient to restart atorvastatin at 37m daily, have lipid panel rechecked with next PCP appointment -Patient to continue all other medications, will start to check blood pressure and HR at home at least once weekly   Subjective: Hailey ROSADOis an 86y.o. year old female who is a primary patient of Plotnikov, AEvie Lacks MD.  The CCM team was consulted for assistance with disease management and care coordination needs.    Engaged with patient by telephone for initial visit in response to provider referral for pharmacy case management and/or care coordination services.   Consent to Services:  The patient was given the following information about Chronic Care Management services today, agreed to services, and gave verbal consent: 1. CCM service includes personalized support from designated clinical staff supervised by the primary care provider, including individualized plan of care and coordination with other care providers 2. 24/7 contact phone numbers for assistance for urgent and routine care needs. 3. Service will only be billed when office clinical staff spend 20 minutes or  more in a month to coordinate care. 4. Only one practitioner may furnish and bill the service in a calendar month. 5.The patient may stop CCM services at any time (effective at the end of the month) by phone call to the office staff. 6. The patient will be responsible for cost sharing (co-pay) of up to 20% of the service fee (after annual deductible is met). Patient agreed to services and consent obtained.  Patient Care Team: Plotnikov, AEvie Lacks MD as PCP - General STamala JulianHLynnell Dike MD as PCP - Cardiology (Cardiology) PIrene Shipper MD as Consulting Physician (Gastroenterology) BDarleen Crocker MD as Consulting Physician (Ophthalmology) SDelice BisonDDarnelle Maffucci [facility] Hospital[facility] Pharmacist (Pharmacist)  Recent office visits:  01/16/21 ACassandria AngerMD - Seen for Paresthesia - Labs ordered - Per provider, Holding Liptor for a couple of weeks due to abdominal pain  - Follow up in 2 months  01/03/21 [name] MD - Seen for Diverticulitis -  Start Cefuroxime Axetil, Take 1 tablet (500 mg total) by mouth 2 (two) times daily with a meal for 10 days., Starting Tue 01/03/2021, Until Fri 01/13/2021, and start metronidazole Take 1 tablet (500 mg total) by mouth 3 (three) times daily., Starting Tue 01/03/2021, Until Mon 01/16/2021 - No follow up noted   Recent consult visits:  05/17/21 ANunzio CobbsMD - OB/GYN - seen for valvular irritation - stop clobetasol and lidocaine, start cephalexin x 1 week, return visit for vulvar biopsy 02/21/21 TCarlos Leveringdraper DO - Sports medicine - Seen for right leg pain  due to nerve impingement  from L1-L2- Depo-Medrol shot given intramuscular - No follow up noted 02/20/21 Tamela Gammon NP - Gynecology - Seen for Vaginal Candidiasis - Started Diflucan 150 mg today and repeat dose in 3 days if symptoms persist for total of 2 doses. - No follow up noted.  02/14/21 Scarlette Shorts MD - Gastroenterology - Seen for IBS - No medication changes and no follow up noted.  01/18/21 Tamela Gammon NP - Gynecology - Seen for routine exam - No medication changes noted -  Follow up in 2 years  12/21/20 Tamela Gammon NP - Gynecology - Seen for chronic Vulvitis - Start Lidocaine 5% PRN, and Start Clobetasol 0.05% - follow up in 3-4 weeks  11/28/20 Darleen Crocker - [facility] for age related nuclear cataract -No notes available  11/18/20 Jari Pigg - Dermatology - Seen for Ulcer - No notes available  11/15/20 Darleen Crocker - [facility] for age related nuclear cataract -No notes available  11/14/20 Darleen Crocker - [facility] for age related nuclear cataract -No notes available      Hospital visits:  None in previous 6 months  Objective:  Lab Results  Component Value Date   CREATININE 1.17 (H) 12/26/2020   BUN 20 12/26/2020   GFR 41.37 (L) 04/16/2019   GFRNONAA >60 08/30/2020   GFRAA 54 (L) 07/21/2020   NA 137 12/26/2020   K 4.8 12/26/2020   CALCIUM 9.0 12/26/2020   CO2 19 (L) 12/26/2020   GLUCOSE 102 (H) 12/26/2020    Lab Results  Component Value Date/Time   HGBA1C 5.6 07/04/2014 05:00 AM   GFR 41.37 (L) 04/16/2019 03:07 PM   GFR 39.95 (L) 05/22/2018 10:41 AM    Last diabetic Eye exam:  No results found for: HMDIABEYEEXA  Last diabetic Foot exam:  No results found for: HMDIABFOOTEX   Lab Results  Component Value Date   CHOL 171 11/26/2016   HDL 90 11/26/2016   LDLCALC 61 11/26/2016   LDLDIRECT 89.1 06/29/2013   TRIG 99 11/26/2016   CHOLHDL 1.9 11/26/2016    Hepatic Function Latest Ref Rng & Units 08/30/2020 08/29/2020 08/28/2020  Total Protein 6.5 - 8.1 g/dL 5.4(L) 5.2(L) 5.4(L)  Albumin 3.5 - 5.0 g/dL 2.8(L) 2.7(L) 2.8(L)  AST 15 - 41 U/L 14(L) 15 20  ALT 0 - 44 U/L _0 Alk Phosphatase 38 - 126 U/L 48 51 50  Total Bilirubin 0.3 - 1.2 mg/dL 0.6 0.8 0.9  Bilirubin, Direct 0.0 - 0.3 mg/dL - - -    Lab Results  Component Value Date/Time   TSH 2.11 04/29/2015 05:19 PM    TSH 1.37 01/25/2014 02:35 PM    CBC Latest Ref Rng & Units 08/30/2020 08/29/2020 08/28/2020  WBC 4.0 - 10.5 K/uL 7.7 8.9 10.4  Hemoglobin 12.0 - 15.0 g/dL 8.8(L) 8.3(L) 9.4(L)  Hematocrit 36.0 - 46.0 % 27.4(L) 26.4(L) 29.1(L)  Platelets 150 - 400 K/uL 200 178 195    Lab Results  Component Value Date/Time   VD25OH 32 12/12/2016 03:27 PM    Clinical ASCVD: No  The ASCVD Risk score (Arnett DK, et al., 2019) failed to calculate for the following reasons:   The 2019 ASCVD risk score is only valid for ages 51 to 58    Depression screen PHQ 2/9 02/09/2021 12/03/2017  Decreased Interest 0 0  Down, Depressed, Hopeless 0 0  PHQ - 2 Score 0 0  Some recent  data might be hidden    Social History   Tobacco Use  Smoking Status Former   Packs/day: 0.25   Years: 28.00   Pack years: 7.00   Types: Cigarettes   Quit date: 1981   Years since quitting: 41.7  Smokeless Tobacco Never   BP Readings from Last 3 Encounters:  05/17/21 136/84  02/21/21 (!) 145/92  02/20/21 130/78   Pulse Readings from Last 3 Encounters:  05/17/21 76  02/14/21 96  02/09/21 77   Wt Readings from Last 3 Encounters:  05/17/21 143 lb (64.9 kg)  02/21/21 138 lb (62.6 kg)  02/14/21 136 lb (61.7 kg)   BMI Readings from Last 3 Encounters:  05/17/21 25.53 kg/m  02/21/21 24.45 kg/m  02/14/21 24.09 kg/m    Assessment/Interventions: Review of patient past medical history, allergies, medications, health status, including review of consultants reports, laboratory and other test data, was performed as part of comprehensive evaluation and provision of chronic care management services.   SDOH:  (Social Determinants of Health) assessments and interventions performed: Yes  SDOH Screenings   Alcohol Screen: Low Risk    Last Alcohol Screening Score (AUDIT): 4  Depression (PHQ2-9): Low Risk    PHQ-2 Score: 0  Financial Resource Strain: Low Risk    Difficulty of Paying Living Expenses: Not hard at all  Food  Insecurity: No Food Insecurity   Worried About Charity fundraiser in the Last Year: Never true   Ran Out of Food in the Last Year: Never true  Housing: Low Risk    Last Housing Risk Score: 0  Physical Activity: Inactive   Days of Exercise per Week: 0 days   Minutes of Exercise per Session: 0 min  Social Connections: Engineer, building services of Communication with Friends and Family: More than three times a week   Frequency of Social Gatherings with Friends and Family: Twice a week   Attends Religious Services: More than 4 times per year   Active Member of [employer] or Organizations: No   Attends Music therapist: More than 4 times per year   Marital Status: Married  Stress: No Stress Concern Present   Feeling of Stress : Not at all  Tobacco Use: Medium Risk   Smoking Tobacco Use: Former   Smokeless Tobacco Use: Never  Transportation Needs: No Data processing manager (Medical): No   Lack of Transportation (Non-Medical): No    CCM Care Plan  Allergies  Allergen Reactions   Macrobid [Nitrofurantoin Monohyd Macro] Other (See Comments)    Nausea, stomach cramps, fatigue , headache.   Meloxicam Other (See Comments)    Jittery and headache   Digoxin And Related     headaches   Mobic [Meloxicam]    Sulfamethoxazole-Trimethoprim Nausea Only    Medications Reviewed Today     Reviewed by Tomasa Blase, [facility] (Pharmacist) on 05/18/21 at 1451  Med List Status: <None>   Medication Order Taking? Sig Documenting Provider Last Dose Status Informant  acetaminophen (TYLENOL) 500 MG tablet [id number] Yes Take 1,000 mg by mouth every 6 (six) hours as needed (body aches / sore hip). Maximum of 6 tablets daily ($RemoveBefore'3000mg'[id number]$ ) [provider] Taking Active Spouse/Significant Other  atorvastatin (LIPITOR) 80 MG tablet [id number] No TAKE 1 TABLET DAILY AT 6PM.  Patient not taking: Reported on 05/18/2021   Belva Crome, MD Not Taking Active   BIOTIN PO  [id number] Yes Take 5,000 mcg by mouth daily. [provider]  Taking Active   cephALEXin (KEFLEX) 500 MG capsule [id number] Yes Take 1 capsule (500 mg total) by mouth 2 (two) times daily. Take for one week. Nunzio Cobbs, MD Taking Active   diclofenac Sodium (VOLTAREN) 1 % GEL [id number] Yes Apply 2 g topically 4 (four) times daily as needed. [provider] Taking Active   DULoxetine (CYMBALTA) 60 MG capsule [id number] Yes TAKE 1 CAPSULE DAILY. Plotnikov, Evie Lacks, MD Taking Active   ELIQUIS 2.5 MG TABS tablet [id number] Yes TAKE 1 TABLET BY MOUTH TWICE DAILY. Belva Crome, MD Taking Active   esomeprazole (NEXIUM) 40 MG capsule [id number] Yes TAKE (1) CAPSULE DAILY. Plotnikov, Evie Lacks, MD Taking Active   fluticasone (FLONASE) 50 MCG/ACT nasal spray [id number] Yes Place 2 sprays into both nostrils daily as needed for allergies or rhinitis. [provider] Taking Active   Homeopathic Products (ARNICARE) GEL [id number] Yes Apply topically daily as needed. [provider] Taking Active   hyoscyamine (LEVSIN) 0.125 MG tablet [id number] Yes TAKE 1-2 TABLETS (0.125-0.25 MG) BY MOUTH EVERY 4 HOURS AS NEEDED FOR UP TO 10 DAYS FOR CRAMPING.  Patient taking differently: Take 0.125-0.25 mg by mouth every 4 (four) hours as needed for cramping.   Plotnikov, Evie Lacks, MD Taking Active   LORazepam (ATIVAN) 1 MG tablet [id number] Yes TAKE 1 TABLET TWICE DAILY AS NEEDED FOR ANXIETY/SLEEP. Plotnikov, Evie Lacks, MD Taking Active   losartan (COZAAR) 25 MG tablet [id number] Yes Take 1 tablet (25 mg total) by mouth daily. Belva Crome, MD Taking Active Spouse/Significant Other  metoprolol succinate (TOPROL-XL) 25 MG 24 hr tablet [id number] Yes TAKE 1 TABLET ONCE DAILY. Tommie Raymond, NP Taking Active   nitroGLYCERIN (NITROSTAT) 0.4 MG SL tablet [id number] No Place 0.4 mg under the tongue every 5 (five) minutes as needed for chest pain (Call 911 at 3rd dose within 15 minutes.).   Patient not taking: Reported on 05/18/2021   [provider] Not Taking Active   ondansetron (ZOFRAN-ODT) 4 MG disintegrating tablet [id number] Yes DISSOLVE 1 TABLET IN MOUTH EVERY 8 HOURS AS NEEDED FOR NAUSEA OR VOMITING. Irene Shipper, MD Taking Active   saccharomyces boulardii [facility]) 250 MG capsule [id number] Yes Take 1 capsule (250 mg total) by mouth 2 (two) times daily.  Patient taking differently: Take 250 mg by mouth daily.   Oswald Hillock, MD Taking Active   sodium chloride (OCEAN) 0.65 % SOLN nasal spray [id number] Yes Place 1 spray into both nostrils as needed for congestion. [provider] Taking Active Spouse/Significant Other            Patient Active Problem List   Diagnosis Date Noted   Right leg pain 02/21/2021   Diverticulitis 01/08/2021   Acute respiratory failure with hypoxia (HCC)    Bacterial colitis    Abnormal CT of the abdomen    Diarrhea of infectious origin    Acute colitis 08/27/2020   Aortic atherosclerosis ([place]) 08/27/2020   Acute prerenal azotemia 08/27/2020   Shock circulatory ([place]) 28/63/8177   Metabolic acidosis with normal anion gap and bicarbonate losses 08/27/2020   Dehydration    Squamous cell cancer of skin of left cheek 11/65/7903   Lichen sclerosus 83/33/8329   Abdominal cramping 03/16/2020   Neurogenic orthostatic hypotension ([place]) 09/16/2019   Injury of face 08/31/2019   Leg abrasion, infected, right, subsequent encounter 08/28/2019   Face lacerations 08/26/2019   Nose fracture 08/26/2019   Concussion 08/26/2019   Wrist pain, acute, right  08/26/2019   Greater trochanteric pain syndrome of right lower extremity 07/30/2019   Viral URI with cough 08/15/2018   Abdominal pain 05/22/2018   Traumatic hematoma of right knee 01/10/2018   Ulcer of right knee (HCC)    Bradycardia with 41-50 beats per minute 12/27/2017   Right bundle branch block (RBBB) on electrocardiogram (ECG) 12/27/2017   Quadriceps tendon rupture  12/24/2017   CKD (chronic kidney disease), stage II 12/24/2017   Cellulitis of leg, right with large prepatella hematoma and open wounds 12/24/2017   Iliotibial band syndrome of right side 12/16/2017   Depression 07/11/2017   Intervertebral lumbar disc disorder with myelopathy, lumbar region 04/25/2017   Family history of colon cancer in mother 01/22/2017   Primary osteoarthritis of both first carpometacarpal joints 01/02/2017   Pain 11/21/2016   Gastroenteritis 09/13/2016   Chronic anticoagulation 05/24/2015   Coronary artery disease involving native coronary artery of native heart with angina pectoris ([place]) 12/20/2014   Iliotibial band syndrome of left side 11/16/2014   Diverticulitis of colon 08/30/2014   Chest pain, atypical 07/03/2014   Diarrhea 12/22/2013   PAF (paroxysmal atrial fibrillation) ([place]) 11/21/2012   Greater trochanteric bursitis of both hips 05/21/2012   Dizzinesses 11/07/2011   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   Osteoarthritis of left hip 07/03/2010   DEGENERATIVE DISC DISEASE, LUMBOSACRAL SPINE 05/18/2010   SYNCOPE 10/27/2008   Essential hypertension 06/16/2007   GERD (gastroesophageal reflux disease) 06/16/2007   COLONIC POLYPS, HX OF 06/16/2007    Immunization History  Administered Date(s) Administered   Influenza Split 06/18/2012   Influenza Whole 05/26/2008, 06/05/2010   Influenza, High Dose Seasonal PF 06/03/2014, 06/12/2017, 05/22/2018, 05/14/2019, 06/07/2020   Influenza,inj,Quad PF,6+ Mos 06/09/2015   Influenza-Unspecified 06/08/2013   PFIZER(Purple Top)SARS-COV-2 Vaccination 09/24/2019, 10/15/2019, 06/07/2020   Pneumococcal Conjugate-13 01/25/2014   Pneumococcal Polysaccharide-23 11/07/2018   Td 06/11/2007, 06/17/2007   Tdap 12/22/2017   Zoster, Live 12/01/2008    Conditions to be addressed/monitored:  Hypertension, Hyperlipidemia, Atrial Fibrillation, GERD/ Diverticulitis, Osteoarthritis, and Insomnia  Care Plan : CCM Care Plan   Updates made by Tomasa Blase, [place] since 05/18/2021 12:00 AM     Problem: HTN, HLD, AFib, CAD, GERD, Gastritis, Insomnia   Priority: High  Onset Date: 05/18/2021     Long-Range Goal: Disease Management   Start Date: 05/18/2021  Expected End Date: 11/15/2021  This Visit's Progress: On track  Priority: High  Note:   Current Barriers:  Unable to independently monitor therapeutic efficacy  Pharmacist Clinical Goal(s):  Patient will achieve adherence to monitoring guidelines and medication adherence to achieve therapeutic efficacy achieve improvement in LDL as evidenced by next lipid panel through collaboration with PharmD and provider.   Interventions: 1:1 collaboration with Plotnikov, Evie Lacks, MD regarding development and update of comprehensive plan of care as evidenced by provider attestation and co-signature Inter-disciplinary care team collaboration (see longitudinal plan of care) Comprehensive medication review performed; medication list updated in electronic medical record  Hypertension (BP goal <140/90) -Controlled -Current treatment: Losartan 31m - 1 tablet daily  Metoprolol Succinate 273m- 1 tablet daily  -Medications previously tried: carvedilol,  furosemide,  -Current home readings: n/a BP Readings from Last 3 Encounters:  05/17/21 136/84  02/21/21 (!) 145/92  02/20/21 130/78  -Current dietary habits: reports that she lightly salts certain foods, does not watch sodium intake, drinks decaffeinated coffee, drinks iced tea 10oz daily -Current exercise habits: previously had been working out with trainer 2-3 times weekly - has fallen out of practice  with this recently  -Denies hypotensive/hypertensive symptoms -Educated on BP goals and benefits of medications for prevention of heart attack, stroke and kidney damage; Daily salt intake goal < 2300 mg; Exercise goal of 150 minutes per week; Importance of home blood pressure monitoring; Proper BP monitoring  technique; -Counseled to monitor BP at home weekly, document, and provide log at future appointments -Counseled on diet and exercise extensively Recommended to continue current medication  Hyperlipidemia/ CAD : (LDL goal < 70) -Controlled Lab Results  Component Value Date   LDLCALC 61 11/26/2016  -Current treatment: Atorvastatin 80mg  - 1 tablet daily - has not restarted since being advised to stop for 2 weeks by PCP 01/2021 Nitroglycerin 0.4mg  - 1 tablet every 5 minutes as needed for chest pain -Medications previously tried: pravastatin   -Current dietary patterns: notes that she does not typically eat large amount of foods high in cholesterol -Current exercise habits: previously had been working out with trainer 2-3 times weekly - has fallen out of practice with this recently  -Educated on Cholesterol goals;  Benefits of statin for ASCVD risk reduction; Importance of limiting foods high in cholesterol; Exercise goal of 150 minutes per week; -Counseled on diet and exercise extensively Recommended to restart atorvastatin at 40mg  daily - patient to have updated Lipid panel with next PCP visit   Atrial Fibrillation (Goal: prevent stroke and major bleeding) -Controlled -CHADSVASC: at least 4  -Current treatment: Rate control: Metoprolol Succinate 25mg  - 1 tablet daily  Anticoagulation: Eliquis 2.5mg  - 1 tablet twice daily - reduced to 2.5mg  BID dosing in setting of factor XI deficiency and age >43 years old - started 2020 -Medications previously tried: digoxin, clopidogrel  -Home BP and HR readings: patient reports that she has not been monitoring, does have BP cuff at home   -Counseled on increased risk of stroke due to Afib and benefits of anticoagulation for stroke prevention; importance of adherence to anticoagulant exactly as prescribed; bleeding risk associated with Eliquis and importance of self-monitoring for signs/symptoms of bleeding; avoidance of NSAIDs due to increased  bleeding risk with anticoagulants; importance of regular laboratory monitoring; seeking medical attention after a head injury or if there is blood in the urine/stool; -Recommended to continue current medication  Chronic back and hip pain (Goal: pain control) -Controlled -Current treatment  Duloxetine 60mg  - 1 capsule daily  Diclofenac 1% gel - 2g applied 4 times daily as needed  Acetaminophen 500mg  - 2 tablets every 6 hours as needed (maximum of 6 tablets daily) -Medications previously tried: tramadol, norco, meloxicam, percocet -Counseled on diet and exercise extensively Recommended to continue current medication Counseled on not exceeding 3000mg  of acetaminophen daily, and not using PO NSAIDs due to bleeding risk as she is taking eliquis   Insomnia (Goal: Promotion of quality sleep) -Controlled -Current treatment: Lorazepam 1mg  - 1 tablet daily as needed  -Medications previously tried/failed: trazodone -Recommended to continue current medication  GERD/ Gastritis (Goal: Prevention of acid reflux/ abdominal pains) -Controlled -Current treatment  Esomeprazole 40mg  - 1 capsule daily  Hyoscyamine 0.125mg  - 1-2 tablets every 4 hours as needed  Ondansetron 4mg  ODT tablets - 1 tablet every 8 hours as needed  -Medications previously tried: pantoprazole, dicyclomine  -Recommended to continue current medication  Health Maintenance -Vaccine gaps: Shingles vaccinations, influenza vaccine -Current therapy:  Biotin 5097mcg - 1 capsule daily  Sodium Chloride 0.65% nasal solution - 1 spray into each nostril as needed  Florastor 250mg  - 1 capsule daily Fluticasone 76mcg/act - 2 spays into  each nostril daily as needed  Arnicare gel - applied to bruises daily as needed  -Educated on Cost vs benefit of each product must be carefully weighed by individual consumer Supplements may interfere with prescription drugs -Patient is satisfied with current therapy and denies issues -Recommended to  continue current medication  Patient Goals/Self-Care Activities Patient will:  - take medications as prescribed check blood pressure at least once weekly, document, and provide at future appointments target a minimum of 150 minutes of moderate intensity exercise weekly engage in dietary modifications by reducing sodium intake   Follow Up Plan: Telephone follow up appointment with care management team member scheduled for: 3 months The patient has been provided with contact information for the care management team and has been advised to call with any health related questions or concerns.         Medication Assistance: None required.  Patient affirms current coverage meets needs.  Patient's preferred pharmacy is:  [facility], [place] [address] Phone: [phone] Fax: [phone]   Uses pill box? Yes Pt endorses 90-100% compliance  Care Plan and Follow Up Patient Decision:  Patient agrees to Care Plan and Follow-up.  Plan: Telephone follow up appointment with care management team member scheduled for:  3 months and The patient has been provided with contact information for the care management team and has been advised to call with any health related questions or concerns.   Tomasa Blase, PharmD Clinical Pharmacist, [place] screening examination/treatment/procedure(s) were performed by non-physician practitioner and as supervising physician I was immediately available for consultation/collaboration.  I agree with above. Lew Dawes, MD

## 2021-05-19 ENCOUNTER — Telehealth: Payer: Self-pay | Admitting: *Deleted

## 2021-05-19 LAB — SURESWAB HSV, TYPE 1/2 DNA, PCR
HSV 1 DNA: NOT DETECTED
HSV 2 DNA: NOT DETECTED

## 2021-05-19 NOTE — Telephone Encounter (Signed)
Patient called stating after she began taken Keflex she started feeling tired, nauseated and developed  headaches. Patient request we call in an alternative medication. I will route this to Provider for recommendations.

## 2021-05-20 ENCOUNTER — Encounter: Payer: Self-pay | Admitting: Obstetrics and Gynecology

## 2021-05-20 LAB — WOUND CULTURE
MICRO NUMBER:: 12341314
RESULT:: NORMAL
SPECIMEN QUALITY:: ADEQUATE

## 2021-05-20 NOTE — Telephone Encounter (Signed)
See My Chart message with result note for 05/20/21. The encounter is now closed.

## 2021-05-22 NOTE — Telephone Encounter (Signed)
Hello Ms. Hailey [name],    I received your phone call about your intolerance to the Keflex antibiotic.   Your reports are now finalized and showed no specific bacterial organisms growing.  The herpes testing is negative for current infection of this virus.    I would like to have your return for the biopsy and not treat with any additional antibiotics at this point.     Patient aware of recommendations

## 2021-05-25 ENCOUNTER — Ambulatory Visit: Payer: Medicare Other | Admitting: Obstetrics and Gynecology

## 2021-05-25 ENCOUNTER — Other Ambulatory Visit: Payer: Self-pay

## 2021-05-25 ENCOUNTER — Other Ambulatory Visit (HOSPITAL_COMMUNITY)
Admission: RE | Admit: 2021-05-25 | Discharge: 2021-05-25 | Disposition: A | Discharge status: Home / Self Care | Payer: Medicare Other | Source: Ambulatory Visit | Attending: Obstetrics and Gynecology | Admitting: Obstetrics and Gynecology

## 2021-05-25 ENCOUNTER — Encounter: Payer: Self-pay | Admitting: Obstetrics and Gynecology

## 2021-05-25 DIAGNOSIS — N763 Subacute and chronic vulvitis: Secondary | ICD-10-CM | POA: Diagnosis present

## 2021-05-25 DIAGNOSIS — N893 Dysplasia of vagina, unspecified: Secondary | ICD-10-CM

## 2021-05-25 DIAGNOSIS — L929 Granulomatous disorder of the skin and subcutaneous tissue, unspecified: Secondary | ICD-10-CM | POA: Diagnosis not present

## 2021-05-25 DIAGNOSIS — N9089 Other specified noninflammatory disorders of vulva and perineum: Secondary | ICD-10-CM | POA: Diagnosis not present

## 2021-05-25 NOTE — Progress Notes (Signed)
GYNECOLOGY  VISIT   HPI: 86 y.o.   Married  Caucasian  female   G2P2001 with No LMP recorded. Patient has had a hysterectomy.   here for   vulvar biopsy for chronic vulvitis.   She had a HA, nausea and stomach ache while taking Keflex, so she stopped.  The Keflex did help a little bit.  She stopped using Clobetasol and the vulvar bleeding stopped.   Wound culture showed a few G+bacilli and a few G- bacilli.  No WBC seen.  HSV testing negative.   Patient is on Eliquis.   GYNECOLOGIC HISTORY: No LMP recorded. Patient has had a hysterectomy. Contraception:  hysterectomy Menopausal hormone therapy:  none Last mammogram:  01-18-21 3D/BIRADS 1 negative Last pap smear:   years ago         OB History     Gravida  2   Para  2   Term  2   Preterm      AB      Living  1      SAB      IAB      Ectopic      Multiple      Live Births                 Patient Active Problem List   Diagnosis Date Noted   Right leg pain 02/21/2021   Diverticulitis 01/08/2021   Acute respiratory failure with hypoxia (HCC)    Bacterial colitis    Abnormal CT of the abdomen    Diarrhea of infectious origin    Acute colitis 08/27/2020   Aortic atherosclerosis ([place]) 08/27/2020   Acute prerenal azotemia 08/27/2020   Shock circulatory ([place]) [id number]   Metabolic acidosis with normal anion gap and bicarbonate losses 08/27/2020   Dehydration    Squamous cell cancer of skin of left cheek [id number]   Lichen sclerosus [id number]   Abdominal cramping 03/16/2020   Neurogenic orthostatic hypotension ([place]) 09/16/2019   Injury of face 08/31/2019   Leg abrasion, infected, right, subsequent encounter 08/28/2019   Face lacerations 08/26/2019   Nose fracture 08/26/2019   Concussion 08/26/2019   Wrist pain, acute, right 08/26/2019   Greater trochanteric pain syndrome of right lower extremity 07/30/2019   Viral URI with cough 08/15/2018   Abdominal pain 05/22/2018   Traumatic hematoma of  right knee 01/10/2018   Ulcer of right knee (HCC)    Bradycardia with 41-50 beats per minute 12/27/2017   Right bundle branch block (RBBB) on electrocardiogram (ECG) 12/27/2017   Quadriceps tendon rupture 12/24/2017   CKD (chronic kidney disease), stage II 12/24/2017   Cellulitis of leg, right with large prepatella hematoma and open wounds 12/24/2017   Iliotibial band syndrome of right side 12/16/2017   Depression 07/11/2017   Intervertebral lumbar disc disorder with myelopathy, lumbar region 04/25/2017   Family history of colon cancer in mother 01/22/2017   Primary osteoarthritis of both first carpometacarpal joints 01/02/2017   Pain 11/21/2016   Gastroenteritis 09/13/2016   Chronic anticoagulation 05/24/2015   Coronary artery disease involving native coronary artery of native heart with angina pectoris ([place]) 12/20/2014   Iliotibial band syndrome of left side 11/16/2014   Diverticulitis of colon 08/30/2014   Chest pain, atypical 07/03/2014   Diarrhea 12/22/2013   PAF (paroxysmal atrial fibrillation) ([place]) 11/21/2012   Greater trochanteric bursitis of both hips 05/21/2012   Dizzinesses 11/07/2011   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   Osteoarthritis of left hip 07/03/2010  DEGENERATIVE DISC DISEASE, LUMBOSACRAL SPINE 05/18/2010   SYNCOPE 10/27/2008   Essential hypertension 06/16/2007   GERD (gastroesophageal reflux disease) 06/16/2007   COLONIC POLYPS, HX OF 06/16/2007    Past Medical History:  Diagnosis Date   Acute blood loss anemia 12/25/2017   Allergic rhinitis    Anxiety    Arthritis    "my whole spine" (07/01/2017)   Atrial fibrillation (HCC)    Bowel obstruction (HCC)    in [place]   Bradycardia with 41-50 beats per minute 12/27/2017   Cancer ([place])    Cellulitis of leg, right with large prepatella hematoma and open wounds 12/26/2017   CKD (chronic kidney disease) stage 2, GFR 60-89 ml/min    Colon polyps    Coronary artery disease    10/18 PCI/DES to p/m LCx  with cutting balloon to mLcx   Diverticulosis of colon    GERD (gastroesophageal reflux disease)    Hip bursitis 2010   Dr Para March, Post op seroma   History of colon polyps    HSV (herpes simplex virus) anogenital infection 07/2019   HTN (hypertension)    IBS (irritable bowel syndrome)    constipation predominant - Dr Earlean Shawl   Lichen sclerosus    Osteopenia 11/2016   T score -2.0 FRAX 15%/4.3%   PAC (premature atrial contraction)    Symptomatiic   Renal insufficiency    Right bundle branch block (RBBB) on electrocardiogram (ECG) 12/27/2017   Scoliosis    SVT (supraventricular tachycardia) ([place])    brief history    Past Surgical History:  Procedure Laterality Date   ANTERIOR AND POSTERIOR VAGINAL REPAIR  01/2002   Archie Endo 01/23/2011   APPENDECTOMY  1946   CARDIAC CATHETERIZATION  06/26/2017   CORONARY ANGIOPLASTY WITH STENT PLACEMENT  07/01/2017   CORONARY ATHERECTOMY N/A 07/01/2017   Procedure: CORONARY ATHERECTOMY;  Surgeon: Belva Crome, MD;  Location: [facility];  Service: Cardiovascular;  Laterality: N/A;   CORONARY STENT INTERVENTION N/A 07/01/2017   Procedure: CORONARY STENT INTERVENTION;  Surgeon: Belva Crome, MD;  Location: [facility];  Service: Cardiovascular;  Laterality: N/A;   HAMMER TOE SURGERY     HEMORRHOID BANDING     HIP SURGERY Left 04/2009   hip examination under anesthesia followed by greater trochanteric bursectomy; iliotibial band tenotomy/notes 01/20/2011   I & D EXTREMITY Right 01/10/2018   Procedure: IRRIGATION AND DEBRIDEMENT RIGHT KNEE, APPLY WOUND VAC;  Surgeon: Newt Minion, MD;  Location: [facility];  Service: Orthopedics;  Laterality: Right;   KNEE BURSECTOMY Right 04/2009   Archie Endo 01/09/2011   LEFT HEART CATH AND CORONARY ANGIOGRAPHY N/A 06/26/2017   Procedure: LEFT HEART CATH AND CORONARY ANGIOGRAPHY;  Surgeon: Belva Crome, MD;  Location: [facility];  Service: Cardiovascular;  Laterality: N/A;   PUBOVAGINAL SLING  01/2002    Archie Endo 01/23/2011   REDUCTION MAMMAPLASTY     TEMPORARY PACEMAKER N/A 07/01/2017   Procedure: TEMPORARY PACEMAKER;  Surgeon: Belva Crome, MD;  Location: [facility];  Service: Cardiovascular;  Laterality: N/A;   VAGINAL HYSTERECTOMY  01/2002   Vaginal hysterectomy, bilateral salpingo-oophorectomy/notes 01/23/2011    Current Outpatient Medications  Medication Sig Dispense Refill   acetaminophen (TYLENOL) 500 MG tablet Take 1,000 mg by mouth every 6 (six) hours as needed (body aches / sore hip). Maximum of 6 tablets daily ('3000mg'$ )     atorvastatin (LIPITOR) 80 MG tablet TAKE 1 TABLET DAILY AT 6PM. 90 tablet 0   BIOTIN  PO Take 5,000 mcg by mouth daily.     cephALEXin (KEFLEX) 500 MG capsule Take 1 capsule (500 mg total) by mouth 2 (two) times daily. Take for one week. 14 capsule 0   diclofenac Sodium (VOLTAREN) 1 % GEL Apply 2 g topically 4 (four) times daily as needed.     DULoxetine (CYMBALTA) 60 MG capsule TAKE 1 CAPSULE DAILY. 90 capsule 1   ELIQUIS 2.5 MG TABS tablet TAKE 1 TABLET BY MOUTH TWICE DAILY. 180 tablet 1   esomeprazole (NEXIUM) 40 MG capsule TAKE (1) CAPSULE DAILY. 90 capsule 1   fluticasone (FLONASE) 50 MCG/ACT nasal spray Place 2 sprays into both nostrils daily as needed for allergies or rhinitis.     Homeopathic Products (ARNICARE) GEL Apply topically daily as needed.     hyoscyamine (LEVSIN) 0.125 MG tablet TAKE 1-2 TABLETS (0.125-0.25 MG) BY MOUTH EVERY 4 HOURS AS NEEDED FOR UP TO 10 DAYS FOR CRAMPING. (Patient taking differently: Take 0.125-0.25 mg by mouth every 4 (four) hours as needed for cramping.) 100 tablet 1   LORazepam (ATIVAN) 1 MG tablet TAKE 1 TABLET TWICE DAILY AS NEEDED FOR ANXIETY/SLEEP. 180 tablet 1   losartan (COZAAR) 25 MG tablet Take 1 tablet (25 mg total) by mouth daily. 90 tablet 3   metoprolol succinate (TOPROL-XL) 25 MG 24 hr tablet TAKE 1 TABLET ONCE DAILY. 90 tablet 0   nitroGLYCERIN (NITROSTAT) 0.4 MG SL tablet Place 0.4 mg under the  tongue every 5 (five) minutes as needed for chest pain (Call 911 at 3rd dose within 15 minutes.).     ondansetron (ZOFRAN-ODT) 4 MG disintegrating tablet DISSOLVE 1 TABLET IN MOUTH EVERY 8 HOURS AS NEEDED FOR NAUSEA OR VOMITING. 20 tablet 0   saccharomyces boulardii (FLORASTOR) 250 MG capsule Take 1 capsule (250 mg total) by mouth 2 (two) times daily. (Patient taking differently: Take 250 mg by mouth daily.) 60 capsule 2   sodium chloride (OCEAN) 0.65 % SOLN nasal spray Place 1 spray into both nostrils as needed for congestion.     No current facility-administered medications for this visit.     ALLERGIES: Macrobid [nitrofurantoin monohyd macro], Meloxicam, Digoxin and related, Keflex [cephalexin], Mobic [meloxicam], and Sulfamethoxazole-trimethoprim  Family History  Problem Relation Age of Onset   Colon cancer Mother        Dx age 66, died at age 35   Diabetes Father    Prostate cancer Father    Prostate cancer Brother    Pancreatic cancer Brother    Stomach cancer Son    Heart attack Neg Hx    Stroke Neg Hx    Esophageal cancer Neg Hx     Social History   Socioeconomic History   Marital status: Married    Spouse name: Not on file   Number of children: 2   Years of education: Not on file   Highest education level: Not on file  Occupational History   Occupation: Patent attorney: RETIRED  Tobacco Use   Smoking status: Former    Packs/day: 0.25    Years: 28.00    Pack years: 7.00    Types: Cigarettes    Quit date: 1981    Years since quitting: 41.7   Smokeless tobacco: Never  Vaping Use   Vaping Use: Never used  Substance and Sexual Activity   Alcohol use: Yes    Comment: 7 vodka drinks a week   Drug use: Never   Sexual activity: Not Currently  Birth control/protection: Surgical    Comment: hysterectomy  Other Topics Concern   Not on file  Social History Narrative   Regular Exercise -  YES         Social Determinants of Health   Financial  Resource Strain: Low Risk    Difficulty of Paying Living Expenses: Not hard at all  Food Insecurity: No Food Insecurity   Worried About Charity fundraiser in the Last Year: Never true   Ran Out of Food in the Last Year: Never true  Transportation Needs: No Transportation Needs   Lack of Transportation (Medical): No   Lack of Transportation (Non-Medical): No  Physical Activity: Inactive   Days of Exercise per Week: 0 days   Minutes of Exercise per Session: 0 min  Stress: No Stress Concern Present   Feeling of Stress : Not at all  Social Connections: Socially Integrated   Frequency of Communication with Friends and Family: More than three times a week   Frequency of Social Gatherings with Friends and Family: Twice a week   Attends Religious Services: More than 4 times per year   Active Member of [employer] or Organizations: No   Attends Music therapist: More than 4 times per year   Marital Status: Married  Human resources officer Violence: Not At Risk   Fear of Current or Ex-Partner: No   Emotionally Abused: No   Physically Abused: No   Sexually Abused: No    Review of Systems  See HPI.   PHYSICAL EXAMINATION:    BP 108/74 (BP Location: Right Arm, Patient Position: Sitting, Cuff Size: Normal)   Pulse 78   Resp 14   Ht '5\' 3"'$  (1.6 m)   Wt 142 lb (64.4 kg)   BMI 25.15 kg/m     General appearance: alert, cooperative and appears stated age   Pelvic: External genitalia:  4 mm ulceration of left labia minora surrounded by erythema.               Urethra:  normal appearing urethra with no masses, tenderness or     Vulvar biopsy Consent done.  Betadine prep.  Local 1% lidocaine, lot [id number], exp 12/2022.  4 mm punch biopsy.  3 interrupted sutures of 3/0 Vicryl.  Gauze bandage placed.  EBL minimal.  No complications.   Chaperone was present for exam:  Raquel Sarna, RN.  ASSESSMENT  Vulvar lesion and chronic vulvitis.   PLAN  FU biopsy.  Stop clobetasol.  Final plan to  follow.    An After Visit Summary was printed and given to the patient.

## 2021-05-25 NOTE — Patient Instructions (Signed)
Vulva Biopsy, Care After This sheet gives you information about how to care for yourself after your procedure. Your doctor may also give you more specific instructions. If you have problems or questions, contact your doctor. What can I expect after the procedure? After the procedure, it is common to have: Slight bleeding from the biopsy site. Soreness at the biopsy site. Follow these instructions at home: Biopsy site care  Follow instructions from your doctor about how to take care of your biopsy site. Make sure you: Clean the area using water and mild soap two times a day or as told by your doctor. Gently pat the area dry. If you were prescribed an antibiotic ointment, apply it as told by your doctor. Do not stop using the antibiotic even if your condition gets better. Take a warm water bath (sitz bath) as needed to help with pain. A sitz bath is taken while you are sitting down. The water should only come up to your hips and cover your butt. Leave stitches (sutures), skin glue, or skin tape (adhesive) strips in place. They may need to stay in place for 2 weeks or longer. If tape strips get loose and curl up, you may trim the loose edges. Do not remove tape strips completely unless your doctor says it is okay. Check your biopsy area every day for signs of infection. Check for: More redness, swelling, or pain. More fluid or blood. Warmth. Pus or a bad smell. Do not rub the biopsy area after peeing (urinating). Gently pat the area dry, or use a bottle filled with warm water (peri-bottle) to clean the area. Gently wipe from front to back. Lifestyle Wear loose, cotton underwear. Do not wear tight pants. Do not use a tampon, douche, or put anything in your vagina for at least 1 week or until your doctor says it is okay. Do not have sex for at least 1 week or until your doctor says it is okay. Do not exercise until your doctor says it is okay. Do not swim or use a hot tub until your doctor  says it is okay. You may shower or take a sitz bath. General instructions Take over-the-counter and prescription medicines only as told by your doctor. Use a sanitary pad until the bleeding stops. Keep all follow-up visits as told by your doctor. This is important. Contact a doctor if: You have more redness, swelling, or pain around your biopsy site. You have more fluid or blood coming from your biopsy site. Your biopsy site feels warm when you touch it. Medicines do not help with your pain. Get help right away if: You have a lot of bleeding from the vulva. You have pus or a bad smell coming from your biopsy site. You have a fever. You have pain in the lower belly (abdomen). Summary After the procedure, it is common to have slight bleeding and soreness at the biopsy site. Follow all instructions as told by your doctor. Clean the area with water and mild soap. Do not rub. Pat the area dry. Take sitz baths as needed. Leave any stitches in place. Check your biopsy site for infection. Signs include more redness, swelling, pain, fluid, or blood, or feeling warm when you touch it. Get help right away if you have a lot of bleeding, a fever, pus or a bad smell, or pain in your lower belly. This information is not intended to replace advice given to you by your health care provider. Make sure you discuss any  questions you have with your health care provider. Document Revised: 02/27/2018 Document Reviewed: 02/27/2018 Elsevier Patient Education  2022 [employer].

## 2021-06-02 ENCOUNTER — Telehealth: Payer: Self-pay | Admitting: Interventional Cardiology

## 2021-06-02 DIAGNOSIS — R0602 Shortness of breath: Secondary | ICD-10-CM

## 2021-06-02 NOTE — Telephone Encounter (Signed)
Pt c/o Shortness Of Breath: STAT if SOB developed within the last 24 hours or pt is noticeably SOB on the phone  1. Are you currently SOB (can you hear that pt is SOB on the phone)? no  2. How long have you been experiencing SOB? All week  3. Are you SOB when sitting or when up moving around? Moving around  4. Are you currently experiencing any other symptoms? Very tired

## 2021-06-02 NOTE — Telephone Encounter (Signed)
Spoke with pt and has noted SOB with activity for about a week and also notes fatigue and this has been going on for months HR running in the 60's and B/P running in the 120/? Per pt noted one incident going up flight of stairs increased in respirations and HR went up to 120 with little activity has to stop and catch breath Per pt recently went back to gym and notices on bike after 4 min needing to stop and rest  previously could ride 30 min with no issues Pt has also lost about 30 lbs due to no appetite.Will forward to Dr Tamala Julian for review and recommendations ./cy

## 2021-06-06 LAB — SURGICAL PATHOLOGY

## 2021-06-07 ENCOUNTER — Telehealth: Payer: Self-pay

## 2021-06-07 ENCOUNTER — Other Ambulatory Visit: Payer: Self-pay | Admitting: Interventional Cardiology

## 2021-06-07 ENCOUNTER — Ambulatory Visit (INDEPENDENT_AMBULATORY_CARE_PROVIDER_SITE_OTHER): Payer: Medicare Other

## 2021-06-07 DIAGNOSIS — R000 Tachycardia, unspecified: Secondary | ICD-10-CM

## 2021-06-07 DIAGNOSIS — R0602 Shortness of breath: Secondary | ICD-10-CM

## 2021-06-07 DIAGNOSIS — R8769 Abnormal cytological findings in specimens from other female genital organs: Secondary | ICD-10-CM

## 2021-06-07 DIAGNOSIS — R002 Palpitations: Secondary | ICD-10-CM

## 2021-06-07 DIAGNOSIS — N9089 Other specified noninflammatory disorders of vulva and perineum: Secondary | ICD-10-CM

## 2021-06-07 NOTE — Telephone Encounter (Signed)
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  No cancer was seen.    The patient has chronic vulvitis of the left labia.    I recommend consultation with GYN ONCOLOGY for evaluation and treatment.  At this point, she may be a candidate for excision of the area.

## 2021-06-07 NOTE — Telephone Encounter (Signed)
Patient said she received a message in My Chart about a result.   I see that vulvar biopsy was signed out final yesterday so I imagine this is what she is referring to.  Please advise what to relay to her in regards to biopsy result.

## 2021-06-07 NOTE — Telephone Encounter (Signed)
Needs 7 day monitor and also 12 lead ECG.

## 2021-06-07 NOTE — Telephone Encounter (Signed)
Spoke with pt and made her aware that I am waiting for Dr. Tamala Julian to review.  She states she rested most of the weekend and felt better.  Was out and about yesterday running errands and noticed she was getting winded again with minimal activity.  Advised I will speak with Dr. Tamala Julian tomorrow if I don't hear from him today.  Pt appreciative for call.

## 2021-06-07 NOTE — Telephone Encounter (Signed)
Pt aware and event ordered and pt coming on Monday 06-12-21 for EKG ./cy

## 2021-06-07 NOTE — Progress Notes (Unsigned)
Enrolled patient for a 7 day Zio XT monitor to be mailed to patients home.  

## 2021-06-07 NOTE — Telephone Encounter (Signed)
Patient is following up, seeking advisement from Dr. Tamala Julian.

## 2021-06-07 NOTE — Telephone Encounter (Signed)
Patient has already been informed through result note info. Referral order placed and she knows they will call her to schedule.

## 2021-06-07 NOTE — Telephone Encounter (Signed)
Please see result note.   She has atypia and possible low grade dysplasia.   I recommend consultation with GYN ONC for evaluation and potential treatment based on the chronic nature of the lesion, her symptoms, and now the potential dysplasia noted.

## 2021-06-07 NOTE — Telephone Encounter (Signed)
I spoke with patient and informed her of results. Referral order placed in Epic and patient was advised that office will be in touch to schedule appointment.

## 2021-06-09 ENCOUNTER — Ambulatory Visit: Payer: Medicare Other | Admitting: Obstetrics and Gynecology

## 2021-06-09 DIAGNOSIS — I480 Paroxysmal atrial fibrillation: Secondary | ICD-10-CM | POA: Diagnosis not present

## 2021-06-09 DIAGNOSIS — I10 Essential (primary) hypertension: Secondary | ICD-10-CM

## 2021-06-09 DIAGNOSIS — I25119 Atherosclerotic heart disease of native coronary artery with unspecified angina pectoris: Secondary | ICD-10-CM | POA: Diagnosis not present

## 2021-06-09 DIAGNOSIS — M1612 Unilateral primary osteoarthritis, left hip: Secondary | ICD-10-CM

## 2021-06-11 ENCOUNTER — Encounter: Payer: Self-pay | Admitting: Obstetrics and Gynecology

## 2021-06-12 ENCOUNTER — Other Ambulatory Visit: Payer: Self-pay

## 2021-06-12 ENCOUNTER — Ambulatory Visit: Payer: Medicare Other

## 2021-06-12 VITALS — BP 110/65 | HR 60

## 2021-06-12 DIAGNOSIS — R000 Tachycardia, unspecified: Secondary | ICD-10-CM

## 2021-06-12 DIAGNOSIS — R002 Palpitations: Secondary | ICD-10-CM

## 2021-06-12 DIAGNOSIS — I480 Paroxysmal atrial fibrillation: Secondary | ICD-10-CM

## 2021-06-12 DIAGNOSIS — R0602 Shortness of breath: Secondary | ICD-10-CM | POA: Insufficient documentation

## 2021-06-12 NOTE — Telephone Encounter (Signed)
Per Dr. Quincy Simmonds plan has changed. Patient will be coming back to the office to discuss options. Patient had contacted [place] through My Chart and was advised there. Staff message sent to appt desk pool to call her to arrange visit here in the office.

## 2021-06-12 NOTE — Telephone Encounter (Signed)
I inquired with Gyn-Onc scheduler this morning because order was cancelled.  She wrote me back: Wheat, Lovena Le, NT  Ramond Craver, RMA  "Good morning,    Dr Berline Lopes spoke with Dr Quincy Simmonds regarding the patient on Friday. Dr Quincy Simmonds was to reach back to the patient for furture testing through your office."  Please advise.

## 2021-06-12 NOTE — Telephone Encounter (Signed)
Please have the patient schedule a follow up appointment with me.  I want to discuss options with her - excision versus referral to a dermatologic specialist.

## 2021-06-12 NOTE — Progress Notes (Signed)
Reason for visit: shortness of breath     Name of MD requesting visit: Dr. Tamala Julian  H&P: Pt with history of atrial fibrillation.  Called to report shortness of breath with activity and increased fatigue.  Per Dr. Marthe Patch 12 lead EKG and 7 day heart monitor.  ROS related to problem: Pt comes to office today for EKG.  She denies any symptoms today.  She brought her 7 day heart monitor with her.  This nurse placed monitor and gave instruction to press button when she was having symptoms.  Assessment and plan per MD: EKG reviewed by DOD.  Pt in afib with heart rate of 60.  Pt will wear monitor for 7 days and return.  Follow up based on results of monitor.

## 2021-06-13 NOTE — Telephone Encounter (Signed)
Office visit with Dr. Quincy Simmonds is scheduled 06/15/21.

## 2021-06-15 ENCOUNTER — Other Ambulatory Visit: Payer: Self-pay

## 2021-06-15 ENCOUNTER — Encounter: Payer: Self-pay | Admitting: Obstetrics and Gynecology

## 2021-06-15 ENCOUNTER — Ambulatory Visit: Payer: Medicare Other | Admitting: Obstetrics and Gynecology

## 2021-06-15 VITALS — BP 126/60 | HR 70 | Ht 62.75 in | Wt 143.0 lb

## 2021-06-15 DIAGNOSIS — N9089 Other specified noninflammatory disorders of vulva and perineum: Secondary | ICD-10-CM

## 2021-06-15 NOTE — Progress Notes (Signed)
GYNECOLOGY  VISIT   HPI: 86 y.o.   Married  Caucasian  female   G2P2001 with No LMP recorded. Patient has had a hysterectomy.   here for follow up and to discuss treatment options.  Patient has a chronic irritation and ulceration of the left vulva.   Vulvar biopsy on 07/28/20 showed chronic inflammation. HSV swab negative for HSV I and II on 05/17/21.  Would culture on 05/17/21 showed a few gram positive bacilli and a few gram negative bacilli, consistent with normal urogenital flora.  She was started on Keflex while wound culture was in process, and this was discontinued due to headache, nausea, and fatigue.  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  The vulvar area is itching but not burning as much.   Was previously using clobetasol on the vulva.   Currently wearing a heart monitor.  Has atrial fibrillation.  On Eliquis.   GYNECOLOGIC HISTORY: No LMP recorded. Patient has had a hysterectomy. Contraception:  Hyst Menopausal hormone therapy:  none Last mammogram: 01-18-21 3D/Neg/BiRads1 Last pap smear:  Years ago        OB History     Gravida  2   Para  2   Term  2   Preterm      AB      Living  1      SAB      IAB      Ectopic      Multiple      Live Births                 Patient Active Problem List   Diagnosis Date Noted   Shortness of breath 06/12/2021   Right leg pain 02/21/2021   Diverticulitis 01/08/2021   Acute respiratory failure with hypoxia (HCC)    Bacterial colitis    Abnormal CT of the abdomen    Diarrhea of infectious origin    Acute colitis 08/27/2020   Aortic atherosclerosis ([place]) 08/27/2020   Acute prerenal azotemia 08/27/2020   Shock circulatory ([place]) 17/61/6073   Metabolic acidosis with normal anion gap and bicarbonate losses 08/27/2020   Dehydration    Squamous cell cancer of skin of left cheek 71/02/2693   Lichen sclerosus 85/46/2703   Abdominal cramping 03/16/2020   Neurogenic orthostatic hypotension  ([place]) 09/16/2019   Injury of face 08/31/2019   Leg abrasion, infected, right, subsequent encounter 08/28/2019   Face lacerations 08/26/2019   Nose fracture 08/26/2019   Concussion 08/26/2019   Wrist pain, acute, right 08/26/2019   Greater trochanteric pain syndrome of right lower extremity 07/30/2019   Viral URI with cough 08/15/2018   Abdominal pain 05/22/2018   Traumatic hematoma of right knee 01/10/2018   Ulcer of right knee (HCC)    Bradycardia with 41-50 beats per minute 12/27/2017   Right bundle branch block (RBBB) on electrocardiogram (ECG) 12/27/2017   Quadriceps tendon rupture 12/24/2017   CKD (chronic kidney disease), stage II 12/24/2017   Cellulitis of leg, right with large prepatella hematoma and open wounds 12/24/2017   Iliotibial band syndrome of right side 12/16/2017   Depression 07/11/2017   Intervertebral lumbar disc disorder with myelopathy, lumbar region 04/25/2017   Family history of colon cancer in mother 01/22/2017   Primary osteoarthritis of both first carpometacarpal joints 01/02/2017   Pain 11/21/2016   Gastroenteritis 09/13/2016   Chronic anticoagulation 05/24/2015   Coronary artery disease involving native coronary artery of native heart with angina pectoris ([place]) 12/20/2014  Iliotibial band syndrome of left side 11/16/2014   Diverticulitis of colon 08/30/2014   Chest pain, atypical 07/03/2014   Diarrhea 12/22/2013   PAF (paroxysmal atrial fibrillation) ([place]) 11/21/2012   Greater trochanteric bursitis of both hips 05/21/2012   Dizzinesses 11/07/2011   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   Osteoarthritis of left hip 07/03/2010   DEGENERATIVE [place] DISEASE, LUMBOSACRAL SPINE 05/18/2010   SYNCOPE 10/27/2008   Essential hypertension 06/16/2007   GERD (gastroesophageal reflux disease) 06/16/2007   COLONIC POLYPS, HX OF 06/16/2007    Past Medical History:  Diagnosis Date   Acute blood loss anemia 12/25/2017   Allergic rhinitis    Anxiety     Arthritis    "my whole spine" (07/01/2017)   Atrial fibrillation (HCC)    Bowel obstruction (HCC)    in [place]   Bradycardia with 41-50 beats per minute 12/27/2017   Cancer ([place])    Cellulitis of leg, right with large prepatella hematoma and open wounds 12/26/2017   CKD (chronic kidney disease) stage 2, GFR 60-89 ml/min    Colon polyps    Coronary artery disease    10/18 PCI/DES to p/m LCx with cutting balloon to mLcx   Diverticulosis of colon    GERD (gastroesophageal reflux disease)    Hip bursitis 2010   Dr Para March, Post op seroma   History of colon polyps    HSV (herpes simplex virus) anogenital infection 07/2019   HTN (hypertension)    IBS (irritable bowel syndrome)    constipation predominant - Dr Earlean Shawl   Lichen sclerosus    Osteopenia 11/2016   T score -2.0 FRAX 15%/4.3%   PAC (premature atrial contraction)    Symptomatiic   Renal insufficiency    Right bundle branch block (RBBB) on electrocardiogram (ECG) 12/27/2017   Scoliosis    SVT (supraventricular tachycardia) ([place])    brief history    Past Surgical History:  Procedure Laterality Date   ANTERIOR AND POSTERIOR VAGINAL REPAIR  01/2002   Archie Endo 01/23/2011   APPENDECTOMY  1948   CARDIAC CATHETERIZATION  06/26/2017   CORONARY ANGIOPLASTY WITH STENT PLACEMENT  07/01/2017   CORONARY ATHERECTOMY N/A 07/01/2017   Procedure: CORONARY ATHERECTOMY;  Surgeon: Belva Crome, MD;  Location: [facility];  Service: Cardiovascular;  Laterality: N/A;   CORONARY STENT INTERVENTION N/A 07/01/2017   Procedure: CORONARY STENT INTERVENTION;  Surgeon: Belva Crome, MD;  Location: [facility];  Service: Cardiovascular;  Laterality: N/A;   HAMMER TOE SURGERY     HEMORRHOID BANDING     HIP SURGERY Left 04/2009   hip examination under anesthesia followed by greater trochanteric bursectomy; iliotibial band tenotomy/notes 01/20/2011   I & D EXTREMITY Right 01/10/2018   Procedure: IRRIGATION AND DEBRIDEMENT RIGHT KNEE, APPLY  WOUND VAC;  Surgeon: Newt Minion, MD;  Location: [facility];  Service: Orthopedics;  Laterality: Right;   KNEE BURSECTOMY Right 04/2009   Archie Endo 01/09/2011   LEFT HEART CATH AND CORONARY ANGIOGRAPHY N/A 06/26/2017   Procedure: LEFT HEART CATH AND CORONARY ANGIOGRAPHY;  Surgeon: Belva Crome, MD;  Location: [facility];  Service: Cardiovascular;  Laterality: N/A;   PUBOVAGINAL SLING  01/2002   Archie Endo 01/23/2011   REDUCTION MAMMAPLASTY     TEMPORARY PACEMAKER N/A 07/01/2017   Procedure: TEMPORARY PACEMAKER;  Surgeon: Belva Crome, MD;  Location: [facility];  Service: Cardiovascular;  Laterality: N/A;   VAGINAL HYSTERECTOMY  01/2002   Vaginal hysterectomy, bilateral salpingo-oophorectomy/notes 01/23/2011  Current Outpatient Medications  Medication Sig Dispense Refill   acetaminophen (TYLENOL) 500 MG tablet Take 1,000 mg by mouth every 6 (six) hours as needed (body aches / sore hip). Maximum of 6 tablets daily (3000mg )     atorvastatin (LIPITOR) 80 MG tablet TAKE 1 TABLET DAILY AT 6PM. 90 tablet 0   BIOTIN PO Take 5,000 mcg by mouth daily.     diclofenac Sodium (VOLTAREN) 1 % GEL Apply 2 g topically 4 (four) times daily as needed.     DULoxetine (CYMBALTA) 60 MG capsule TAKE 1 CAPSULE DAILY. 90 capsule 1   ELIQUIS 2.5 MG TABS tablet TAKE 1 TABLET BY MOUTH TWICE DAILY. 180 tablet 1   esomeprazole (NEXIUM) 40 MG capsule TAKE (1) CAPSULE DAILY. 90 capsule 1   fluticasone (FLONASE) 50 MCG/ACT nasal spray Place 2 sprays into both nostrils daily as needed for allergies or rhinitis.     Homeopathic Products (ARNICARE) GEL Apply topically daily as needed.     hyoscyamine (LEVSIN) 0.125 MG tablet TAKE 1-2 TABLETS (0.125-0.25 MG) BY MOUTH EVERY 4 HOURS AS NEEDED FOR UP TO 10 DAYS FOR CRAMPING. (Patient taking differently: Take 0.125-0.25 mg by mouth every 4 (four) hours as needed for cramping.) 100 tablet 1   LORazepam (ATIVAN) 1 MG tablet TAKE 1 TABLET TWICE DAILY AS NEEDED FOR  ANXIETY/SLEEP. 180 tablet 1   losartan (COZAAR) 25 MG tablet Take 1 tablet (25 mg total) by mouth daily. 90 tablet 3   metoprolol succinate (TOPROL-XL) 25 MG 24 hr tablet TAKE 1 TABLET ONCE DAILY. 90 tablet 0   nitroGLYCERIN (NITROSTAT) 0.4 MG SL tablet Place 0.4 mg under the tongue every 5 (five) minutes as needed for chest pain (Call 911 at 3rd dose within 15 minutes.).     ondansetron (ZOFRAN-ODT) 4 MG disintegrating tablet DISSOLVE 1 TABLET IN MOUTH EVERY 8 HOURS AS NEEDED FOR NAUSEA OR VOMITING. 20 tablet 0   saccharomyces boulardii (FLORASTOR) 250 MG capsule Take 1 capsule (250 mg total) by mouth 2 (two) times daily. (Patient taking differently: Take 250 mg by mouth daily.) 60 capsule 2   sodium chloride (OCEAN) 0.65 % SOLN nasal spray Place 1 spray into both nostrils as needed for congestion.     No current facility-administered medications for this visit.     ALLERGIES: Macrobid [nitrofurantoin monohyd macro], Meloxicam, Digoxin and related, Keflex [cephalexin], Mobic [meloxicam], and Sulfamethoxazole-trimethoprim  Family History  Problem Relation Age of Onset   Colon cancer Mother        Dx age 75, died at age 59   Diabetes Father    Prostate cancer Father    Prostate cancer Brother    Pancreatic cancer Brother    Stomach cancer Son    Heart attack Neg Hx    Stroke Neg Hx    Esophageal cancer Neg Hx     Social History   Socioeconomic History   Marital status: Married    Spouse name: Not on file   Number of children: 2   Years of education: Not on file   Highest education level: Not on file  Occupational History   Occupation: Patent attorney: RETIRED  Tobacco Use   Smoking status: Former    Packs/day: 0.25    Years: 28.00    Pack years: 7.00    Types: Cigarettes    Quit date: 1981    Years since quitting: 41.7   Smokeless tobacco: Never  Vaping Use   Vaping Use: Never used  Substance and Sexual Activity   Alcohol use: Yes    Comment: 7 vodka drinks  a week   Drug use: Never   Sexual activity: Not Currently    Birth control/protection: Surgical    Comment: hysterectomy  Other Topics Concern   Not on file  Social History Narrative   Regular Exercise -  YES         Social Determinants of Health   Financial Resource Strain: Low Risk    Difficulty of Paying Living Expenses: Not hard at all  Food Insecurity: No Food Insecurity   Worried About Charity fundraiser in the Last Year: Never true   Ran Out of Food in the Last Year: Never true  Transportation Needs: No Transportation Needs   Lack of Transportation (Medical): No   Lack of Transportation (Non-Medical): No  Physical Activity: Inactive   Days of Exercise per Week: 0 days   Minutes of Exercise per Session: 0 min  Stress: No Stress Concern Present   Feeling of Stress : Not at all  Social Connections: Socially Integrated   Frequency of Communication with Friends and Family: More than three times a week   Frequency of Social Gatherings with Friends and Family: Twice a week   Attends Religious Services: More than 4 times per year   Active Member of [employer] or Organizations: No   Attends Music therapist: More than 4 times per year   Marital Status: Married  Human resources officer Violence: Not At Risk   Fear of Current or Ex-Partner: No   Emotionally Abused: No   Physically Abused: No   Sexually Abused: No    Review of Systems  All other systems reviewed and are negative.  PHYSICAL EXAMINATION:    BP 126/60   Pulse 70   Ht 5' 2.75" (1.594 m)   Wt 143 lb (64.9 kg)   BMI 25.53 kg/m     General appearance: alert, cooperative and appears stated age  Pelvic: External genitalia:  Left labia minora with suture present, which was removed.  Ulcerated area 5 mm surrounded by erythema.              Urethra:  normal appearing urethra with no masses, tenderness or lesions              Bartholins and Skenes: normal            Chaperone was present for exam:   yes.  ASSESSMENT  Chronic vulvitis.  Persistent lesion.  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  Prior biopsy chronic inflammation.  On Eliquis.   PLAN  We discussed options for care:  excision under anesthesia as outpatient procedure (goals of symptom relief and additional pathology specimen discussed as the reason for this approach), referral to vulvar dermatologic specialist, [facility] as she does not have a diagnosis of malignancy.  Patient declines surgical excision.     Will refer to Dr. Earnie Larsson at [place], [facility].    An After Visit Summary was printed and given to the patient.  20 min total time was spent for this patient encounter, including preparation, face-to-face counseling with the patient, coordination of care, and documentation of the encounter.

## 2021-06-19 ENCOUNTER — Other Ambulatory Visit: Payer: Self-pay

## 2021-06-19 ENCOUNTER — Telehealth: Payer: Self-pay | Admitting: Obstetrics and Gynecology

## 2021-06-19 MED ORDER — DICYCLOMINE HCL 20 MG PO TABS: ORAL_TABLET | ORAL | 0 refills | Status: DC

## 2021-06-19 MED ORDER — ONDANSETRON HCL 4 MG PO TABS: ORAL_TABLET | ORAL | 0 refills | Status: DC

## 2021-06-19 NOTE — Telephone Encounter (Signed)
Once offices notes signed/completed referral will be faxed to [phone] to Dr.Rita Picardo.

## 2021-06-19 NOTE — Telephone Encounter (Signed)
Please place referral to Dr. Earnie Larsson at [place], [facility].  Dr. Berline Lopes has indicates she does not need GYN ONC consultation at this time.   Patient has chronic left vulvitis with ulceration which is symptomatic. She has been treated with Clobetasol.  Biopsy 07/28/20 showed chronic inflammation negative for dysplasia or malignancy. 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   Patient is symptomatic and declines surgical care.   She is on Eliquis and is wearing a heart monitor.

## 2021-06-20 NOTE — Telephone Encounter (Signed)
Office notes faxed to the below number, patient is aware [place] office will reach out to her to schedule.

## 2021-06-21 ENCOUNTER — Other Ambulatory Visit (INDEPENDENT_AMBULATORY_CARE_PROVIDER_SITE_OTHER): Payer: Medicare Other

## 2021-06-21 ENCOUNTER — Other Ambulatory Visit: Payer: Self-pay

## 2021-06-21 ENCOUNTER — Ambulatory Visit: Payer: Medicare Other | Admitting: Obstetrics and Gynecology

## 2021-06-21 DIAGNOSIS — R109 Unspecified abdominal pain: Secondary | ICD-10-CM

## 2021-06-21 LAB — URINALYSIS
Bilirubin Urine: NEGATIVE
Hgb urine dipstick: NEGATIVE
Ketones, ur: NEGATIVE
Leukocytes,Ua: NEGATIVE
Nitrite: NEGATIVE
Specific Gravity, Urine: 1.025 (ref 1.000–1.030)
Total Protein, Urine: NEGATIVE
Urine Glucose: NEGATIVE
Urobilinogen, UA: 0.2 (ref 0.0–1.0)
pH: 6 (ref 5.0–8.0)

## 2021-06-21 LAB — COMPREHENSIVE METABOLIC PANEL
ALT: 8 U/L (ref 0–35)
AST: 18 U/L (ref 0–37)
Albumin: 3.9 g/dL (ref 3.5–5.2)
Alkaline Phosphatase: 65 U/L (ref 39–117)
BUN: 14 mg/dL (ref 6–23)
CO2: 26 mEq/L (ref 19–32)
Calcium: 8.9 mg/dL (ref 8.4–10.5)
Chloride: 99 mEq/L (ref 96–112)
Creatinine, Ser: 1.13 mg/dL (ref 0.40–1.20)
GFR: 44.66 mL/min — ABNORMAL LOW (ref >60.00)
Glucose, Bld: 89 mg/dL (ref 70–99)
Potassium: 4.3 mEq/L (ref 3.5–5.1)
Sodium: 132 mEq/L — ABNORMAL LOW (ref 135–145)
Total Bilirubin: 0.3 mg/dL (ref 0.2–1.2)
Total Protein: 6.7 g/dL (ref 6.0–8.3)

## 2021-06-21 LAB — CBC WITH DIFFERENTIAL/PLATELET
Basophils Absolute: 0 10*3/uL (ref 0.0–0.1)
Basophils Relative: 0.5 % (ref 0.0–3.0)
Eosinophils Absolute: 0.3 10*3/uL (ref 0.0–0.7)
Eosinophils Relative: 4 % (ref 0.0–5.0)
HCT: 33.4 % — ABNORMAL LOW (ref 36.0–46.0)
Hemoglobin: 10.5 g/dL — ABNORMAL LOW (ref 12.0–15.0)
Lymphocytes Relative: 20.2 % (ref 12.0–46.0)
Lymphs Abs: 1.5 10*3/uL (ref 0.7–4.0)
MCHC: 31.6 g/dL (ref 30.0–36.0)
MCV: 87.6 fl (ref 78.0–100.0)
Monocytes Absolute: 0.7 10*3/uL (ref 0.1–1.0)
Monocytes Relative: 9.1 % (ref 3.0–12.0)
Neutro Abs: 4.9 10*3/uL (ref 1.4–7.7)
Neutrophils Relative %: 66.2 % (ref 43.0–77.0)
Platelets: 308 10*3/uL (ref 150.0–400.0)
RBC: 3.81 Mil/uL — ABNORMAL LOW (ref 3.87–5.11)
RDW: 15 % (ref 11.5–15.5)
WBC: 7.4 10*3/uL (ref 4.0–10.5)

## 2021-06-23 DIAGNOSIS — R002 Palpitations: Secondary | ICD-10-CM | POA: Diagnosis not present

## 2021-06-23 DIAGNOSIS — R0602 Shortness of breath: Secondary | ICD-10-CM | POA: Diagnosis not present

## 2021-06-23 DIAGNOSIS — R000 Tachycardia, unspecified: Secondary | ICD-10-CM | POA: Diagnosis not present

## 2021-06-27 ENCOUNTER — Other Ambulatory Visit: Payer: Self-pay | Admitting: Interventional Cardiology

## 2021-06-28 ENCOUNTER — Encounter: Payer: Self-pay | Admitting: Internal Medicine

## 2021-06-28 ENCOUNTER — Ambulatory Visit: Payer: Medicare Other | Admitting: Internal Medicine

## 2021-06-28 VITALS — BP 110/60 | HR 99 | Ht 63.0 in | Wt 143.2 lb

## 2021-06-28 DIAGNOSIS — K589 Irritable bowel syndrome without diarrhea: Secondary | ICD-10-CM | POA: Diagnosis not present

## 2021-06-28 DIAGNOSIS — R109 Unspecified abdominal pain: Secondary | ICD-10-CM

## 2021-06-28 DIAGNOSIS — R197 Diarrhea, unspecified: Secondary | ICD-10-CM | POA: Diagnosis not present

## 2021-06-28 MED ORDER — IBGARD 90 MG PO CPCR: ORAL_CAPSULE | ORAL | Status: DC

## 2021-06-28 MED ORDER — CITRUCEL PO POWD: ORAL | Status: DC

## 2021-06-28 NOTE — Progress Notes (Signed)
HISTORY OF PRESENT ILLNESS:  Hailey [name] is a 86 y.o. female with past medical history as listed below who presents today for recent problems with abdominal cramping and diarrhea.  She was last evaluated in this office February 14, 2021 after having what was felt to be a flare of irritable bowel syndrome.  She also has a history of bacterial colitis for which she was previously hospitalized.  Patient was continued on probiotic and had Levsin sublingual as needed.  She contacted the office June 18, 2021 reporting an episode of abdominal cramping with vomiting and diarrhea on June 09, 2021.  At the time of her initial call she was placed on a brat diet.  She was prescribed Bentyl for cramping and Zofran for nausea.  She slowly improved but was worried.  On June 21, 2021 she underwent blood work including comprehensive metabolic panel and CBC.  Comprehensive metabolic panel was unremarkable.  CBC revealed hemoglobin 10.5.  Improved from previous value of 8.811 months earlier.  Normal white blood cell count.  Her last complete colonoscopy with Dr. Earlean Shawl was performed May 2013.  This revealed extensive left-sided diverticulosis and large internal hemorrhoids.  No other abnormalities.  Patient was feeling better.  She advanced her diet.  Was having issues with constipation.  Took Colace which did not help.  Ate a flat red and had a glass of wine at [employer] last evening.  Some abdominal cramping later that evening.  No bleeding.  Feeling well this afternoon.  Leaving for a relatives wedding tomorrow  REVIEW OF SYSTEMS:  All non-GI ROS negative unless otherwise stated in the HPI except for sinus and allergy trouble, anxiety, arthritis, back pain, sleeping problems, ankle edema, shortness of breath  Past Medical History:  Diagnosis Date   Acute blood loss anemia 12/25/2017   Allergic rhinitis    Anxiety    Arthritis    "my whole spine" (07/01/2017)   Atrial fibrillation (HCC)    Bowel obstruction ([place])     in [place]   Bradycardia with 41-50 beats per minute 12/27/2017   Cancer ([place])    Cellulitis of leg, right with large prepatella hematoma and open wounds 12/26/2017   CKD (chronic kidney disease) stage 2, GFR 60-89 ml/min    Colon polyps    Coronary artery disease    10/18 PCI/DES to p/m LCx with cutting balloon to mLcx   Diverticulosis of colon    GERD (gastroesophageal reflux disease)    Hip bursitis 2010   Dr Para March, Post op seroma   History of colon polyps    HSV (herpes simplex virus) anogenital infection 07/2019   HTN (hypertension)    IBS (irritable bowel syndrome)    constipation predominant - Dr Earlean Shawl   Lichen sclerosus    Osteopenia 11/2016   T score -2.0 FRAX 15%/4.3%   PAC (premature atrial contraction)    Symptomatiic   Renal insufficiency    Right bundle branch block (RBBB) on electrocardiogram (ECG) 12/27/2017   Scoliosis    SVT (supraventricular tachycardia) ([place])    brief history    Past Surgical History:  Procedure Laterality Date   ANTERIOR AND POSTERIOR VAGINAL REPAIR  01/2002   Archie Endo 01/23/2011   APPENDECTOMY  1948   CARDIAC CATHETERIZATION  06/26/2017   CORONARY ANGIOPLASTY WITH STENT PLACEMENT  07/01/2017   CORONARY ATHERECTOMY N/A 07/01/2017   Procedure: CORONARY ATHERECTOMY;  Surgeon: Belva Crome, MD;  Location: [facility] [facility];  Service: Cardiovascular;  Laterality: N/A;  CORONARY STENT INTERVENTION N/A 07/01/2017   Procedure: CORONARY STENT INTERVENTION;  Surgeon: Belva Crome, MD;  Location: [facility] [facility];  Service: Cardiovascular;  Laterality: N/A;   HAMMER TOE SURGERY     HEMORRHOID BANDING     HIP SURGERY Left 04/2009   hip examination under anesthesia followed by greater trochanteric bursectomy; iliotibial band tenotomy/notes 01/20/2011   I & D EXTREMITY Right 01/10/2018   Procedure: IRRIGATION AND DEBRIDEMENT RIGHT KNEE, APPLY WOUND VAC;  Surgeon: Newt Minion, MD;  Location: [facility];  Service: Orthopedics;  Laterality: Right;    KNEE BURSECTOMY Right 04/2009   Archie Endo 01/09/2011   LEFT HEART CATH AND CORONARY ANGIOGRAPHY N/A 06/26/2017   Procedure: LEFT HEART CATH AND CORONARY ANGIOGRAPHY;  Surgeon: Belva Crome, MD;  Location: [facility];  Service: Cardiovascular;  Laterality: N/A;   PUBOVAGINAL SLING  01/2002   Archie Endo 01/23/2011   REDUCTION MAMMAPLASTY     TEMPORARY PACEMAKER N/A 07/01/2017   Procedure: TEMPORARY PACEMAKER;  Surgeon: Belva Crome, MD;  Location: [facility];  Service: Cardiovascular;  Laterality: N/A;   VAGINAL HYSTERECTOMY  01/2002   Vaginal hysterectomy, bilateral salpingo-oophorectomy/notes 01/23/2011    Social History Hailey [name]  reports that she quit smoking about 41 years ago. Her smoking use included cigarettes. She has a 7.00 pack-year smoking history. She has never used smokeless tobacco. She reports current alcohol use. She reports that she does not use drugs.  family history includes Colon cancer in her mother; Diabetes in her father; Pancreatic cancer in her brother; Prostate cancer in her brother and father; Stomach cancer in her son.  Allergies  Allergen Reactions   Macrobid [Nitrofurantoin Monohyd Macro] Other (See Comments)    Nausea, stomach cramps, fatigue , headache.   Meloxicam Other (See Comments)    Jittery and headache   Digoxin And Related     headaches   Keflex [Cephalexin] Nausea And Vomiting    Nausea, fatigue, and headache.   Mobic [Meloxicam]    Sulfamethoxazole-Trimethoprim Nausea Only       PHYSICAL EXAMINATION: Vital signs: BP 110/60   Pulse 99   Ht 5\' 3"  (1.6 m)   Wt 143 lb 4 oz (65 kg)   BMI 25.38 kg/m   Constitutional: generally well-appearing, no acute distress Psychiatric: alert and oriented x3, cooperative Eyes: extraocular movements intact, anicteric, conjunctiva pink Mouth: oral pharynx moist, no lesions Neck: supple no lymphadenopathy Cardiovascular: heart regular rate and rhythm, no murmur Lungs: clear to  auscultation bilaterally Abdomen: soft, nontender, nondistended, no obvious ascites, no peritoneal signs, normal bowel sounds, no organomegaly Rectal: Omitted Extremities: no clubbing or cyanosis.  Chronic stasis changes bilaterally with 1+ lower extremity edema bilaterally Skin: no lesions on visible extremities Neuro: No focal deficits.  Cranial nerves intact  ASSESSMENT:  1.  Recent problems (isolated episode) with abdominal cramping, loose stools, and vomiting.  Improved with supportive care.  Has a tendency toward alternating bowel habits.  Noted to have extensive left-sided diverticulosis.   PLAN:  1.  Initiate Citrucel 2 tablespoons daily to help with bowel consistency 2.  IBgard 1 before meals 3.  Continue Bentyl as needed 4.  Continue Zofran as needed 5.  Routine GI office follow-up 2 months.  Contact the office in the interim for any questions or problems A total time of 30 minutes was spent preparing to see the patient, reviewing test, obtaining history, performing medically appropriate physical exam, counseling the patient regarding her above listed  issues, ordering medications and clinical follow-up, and documenting clinical information in the health record

## 2021-06-28 NOTE — Patient Instructions (Addendum)
If you are age 84 or older, your body mass index should be between 23-30. Your Body mass index is 25.38 kg/m. If this is out of the aforementioned range listed, please consider follow up with your Primary Care Provider.  If you are age 77 or younger, your body mass index should be between 19-25. Your Body mass index is 25.38 kg/m. If this is out of the aformentioned range listed, please consider follow up with your Primary Care Provider.   ________________________________________________________  The [facility] providers would like to encourage you to use [facility] to communicate with providers for non-urgent requests or questions.  Due to long hold times on the telephone, sending your provider a message by [facility] may be a faster and more efficient way to get a response.  Please allow 48 business hours for a response.  Please remember that this is for non-urgent requests.  _______________________________________________________  Please purchase the following medications over the counter and take as directed: Citrucel: Take 2 tablespoons as directed daily  IBGard: Take 1 capsule before meals   You have been scheduled for a follow up appointment on Tuesday, 12-20 at 3:00 pm.   Thank you for entrusting me with your care and for choosing [facility], Dr. Scarlette Shorts

## 2021-07-03 NOTE — Telephone Encounter (Signed)
Lm to call back ./cy 

## 2021-07-03 NOTE — Telephone Encounter (Signed)
Spoke with pt and now is having swelling  feet,ankles,and belly Per pt has not weighed lately and also has not increased any salt intake no fever or cough noted Will forward to Dr Tamala Julian for review and recommendations./cy

## 2021-07-04 ENCOUNTER — Other Ambulatory Visit: Payer: Self-pay | Admitting: Internal Medicine

## 2021-07-04 ENCOUNTER — Telehealth: Payer: Self-pay | Admitting: Nurse Practitioner

## 2021-07-04 DIAGNOSIS — R0609 Other forms of dyspnea: Secondary | ICD-10-CM

## 2021-07-04 DIAGNOSIS — I4821 Permanent atrial fibrillation: Secondary | ICD-10-CM

## 2021-07-04 DIAGNOSIS — I5032 Chronic diastolic (congestive) heart failure: Secondary | ICD-10-CM

## 2021-07-04 MED ORDER — SPIRONOLACTONE 25 MG PO TABS: 12.5000 mg | ORAL_TABLET | Freq: Every day | ORAL | 3 refills | Status: DC

## 2021-07-04 MED ORDER — EMPAGLIFLOZIN 10 MG PO TABS: 10.0000 mg | ORAL_TABLET | Freq: Every day | ORAL | 3 refills | Status: DC

## 2021-07-04 NOTE — Telephone Encounter (Signed)
Reviewed results and plan of care with patient who verbalized understanding. She agrees to start spironolactone 12.5 mg once daily and jardiance 10 mg daily after she comes to our office tomorrow, 10/26 for lab work. I advised her to let us know if Vania Rea is not affordable and we will check on the price of Farxiga. For follow-up appointment, I advised that we will await [name], RN return to office due to Dr. Thompson Caul schedule is full. I advised patient that [name] will return to the office next week. I advised the patient to call back with questions or concerns and she thanked me for the call.

## 2021-07-04 NOTE — Telephone Encounter (Signed)
-----   Message from Belva Crome, MD sent at 07/04/2021  1:10 PM EDT ----- Let the patient know she is in AF 100% of the time and contributes to swelling and SOB. See message from earlier today for recommendations. I recommended Farxiga or Jardiance 10 mg daily; Spironolactone 12.5 mg daily. BMET in 7-10 days withn f/u OV with me on date of labs. Before starting therapy, she needs CBC, BMET, and BNP today or in AM. A copy will be sent to Plotnikov, Evie Lacks, MD

## 2021-07-05 ENCOUNTER — Other Ambulatory Visit: Payer: Self-pay

## 2021-07-05 ENCOUNTER — Other Ambulatory Visit: Payer: Medicare Other | Admitting: *Deleted

## 2021-07-05 DIAGNOSIS — I4821 Permanent atrial fibrillation: Secondary | ICD-10-CM

## 2021-07-05 DIAGNOSIS — I5032 Chronic diastolic (congestive) heart failure: Secondary | ICD-10-CM | POA: Diagnosis not present

## 2021-07-05 DIAGNOSIS — R0609 Other forms of dyspnea: Secondary | ICD-10-CM | POA: Diagnosis not present

## 2021-07-06 DIAGNOSIS — I5032 Chronic diastolic (congestive) heart failure: Secondary | ICD-10-CM

## 2021-07-06 DIAGNOSIS — I480 Paroxysmal atrial fibrillation: Secondary | ICD-10-CM

## 2021-07-06 LAB — BASIC METABOLIC PANEL
BUN/Creatinine Ratio: 22 (ref 12–28)
BUN: 21 mg/dL (ref 8–27)
CO2: 21 mmol/L (ref 20–29)
Calcium: 9.1 mg/dL (ref 8.7–10.3)
Chloride: 102 mmol/L (ref 96–106)
Creatinine, Ser: 0.94 mg/dL (ref 0.57–1.00)
Glucose: 61 mg/dL — ABNORMAL LOW (ref 70–99)
Potassium: 4.8 mmol/L (ref 3.5–5.2)
Sodium: 137 mmol/L (ref 134–144)
eGFR: 60 mL/min/{1.73_m2} (ref >59)

## 2021-07-06 LAB — CBC
Hematocrit: 31.9 % — ABNORMAL LOW (ref 34.0–46.6)
Hemoglobin: 10.1 g/dL — ABNORMAL LOW (ref 11.1–15.9)
MCH: 27.7 pg (ref 26.6–33.0)
MCHC: 31.7 g/dL (ref 31.5–35.7)
MCV: 87 fL (ref 79–97)
Platelets: 302 10*3/uL (ref 150–450)
RBC: 3.65 x10E6/uL — ABNORMAL LOW (ref 3.77–5.28)
RDW: 13.9 % (ref 11.7–15.4)
WBC: 6.3 10*3/uL (ref 3.4–10.8)

## 2021-07-06 LAB — PRO B NATRIURETIC PEPTIDE: NT-Pro BNP: 1355 pg/mL — ABNORMAL HIGH (ref 0–738)

## 2021-07-06 NOTE — Telephone Encounter (Signed)
Called patient with [employer]. Patient verbalized understanding and will await for Dr. Thompson Caul nurse to call her to make an appointment for next week.

## 2021-07-06 NOTE — Telephone Encounter (Signed)
Swinyer, [name], NP     5:12 PM Note Reviewed results and plan of care with patient who verbalized understanding. She agrees to start spironolactone 12.5 mg once daily and jardiance 10 mg daily after she comes to our office tomorrow, 10/26 for lab work. I advised her to let us know if Hailey [name] is not affordable and we will check on the price of Farxiga. For follow-up appointment, I advised that we will await [name], RN return to office due to Dr. Thompson Caul schedule is full. I advised patient that [name] will return to the office next week. I advised the patient to call back with questions or concerns and she thanked me for the call.      Will forward to Pharmacist to see if it's okay for patient to start [place].

## 2021-07-11 NOTE — Telephone Encounter (Signed)
Pt scheduled 11/8 to see Dr. Tamala Julian.  Pt agreeable to date and time.

## 2021-07-12 ENCOUNTER — Other Ambulatory Visit: Payer: Medicare Other | Admitting: *Deleted

## 2021-07-12 ENCOUNTER — Other Ambulatory Visit: Payer: Self-pay

## 2021-07-12 DIAGNOSIS — I5032 Chronic diastolic (congestive) heart failure: Secondary | ICD-10-CM

## 2021-07-12 DIAGNOSIS — I480 Paroxysmal atrial fibrillation: Secondary | ICD-10-CM

## 2021-07-13 LAB — BASIC METABOLIC PANEL
BUN/Creatinine Ratio: 17 (ref 12–28)
BUN: 23 mg/dL (ref 8–27)
CO2: 18 mmol/L — ABNORMAL LOW (ref 20–29)
Calcium: 9.2 mg/dL (ref 8.7–10.3)
Chloride: 101 mmol/L (ref 96–106)
Creatinine, Ser: 1.32 mg/dL — ABNORMAL HIGH (ref 0.57–1.00)
Glucose: 75 mg/dL (ref 70–99)
Potassium: 4.9 mmol/L (ref 3.5–5.2)
Sodium: 136 mmol/L (ref 134–144)
eGFR: 40 mL/min/{1.73_m2} — ABNORMAL LOW (ref >59)

## 2021-07-14 ENCOUNTER — Other Ambulatory Visit: Payer: Medicare Other

## 2021-07-17 NOTE — Progress Notes (Addendum)
Cardiology Office Note:    Date:  07/18/2021   ID:  Hailey [name], DOB 01/18/1935, MRN [id number]  PCP:  Cassandria Anger, MD  Cardiologist:  Sinclair Grooms, MD   Referring MD: Cassandria Anger, MD   Chief Complaint  Patient presents with   Atrial Fibrillation   Congestive Heart Failure     History of Present Illness:    Hailey [name] is a 86 y.o. female with a hx of  PSVT, paroxysmal atrial fibrillation on chronic anticoagulation, CAD with PCI DES of RCA 2018, and hypertension, Abnormal prior myocardial perfusion imaging 9147, A/C diastolic HF 04/2955 improved with increased diuresis.   Recently has experienced lower extremity swelling, dyspnea on exertion, and orthopnea.  Diuretic therapy started in the form of spironolactone and empagliflozin.  In today for follow-up and blood work check.  She cannot tell me if she is any better after starting therapy for diastolic heart failure.  This is mostly because she went to the beach for the weekend and forgot to take the spironolactone and Jardiance with her.  She missed therapy for 3 days.  She began noticing late yesterday that her lower extremities with swelling.  Breathing may have gotten a little bit better prior to going to the beach.  She still has some shortness of breath now when she moves around.  She denies orthopnea.  She did not take the medication yesterday.  They arrived back home from the beach yesterday.  She resumed therapy today.  On the 2 new therapies, she was having some nausea and queasy stomach.  It resolved after she missed taking the medication over the past weekend.  Past Medical History:  Diagnosis Date   Acute blood loss anemia 12/25/2017   Allergic rhinitis    Anxiety    Arthritis    "my whole spine" (07/01/2017)   Atrial fibrillation (HCC)    Bowel obstruction (HCC)    in [place]   Bradycardia with 41-50 beats per minute 12/27/2017   Cancer ([place])    Cellulitis of leg, right with large  prepatella hematoma and open wounds 12/26/2017   CKD (chronic kidney disease) stage 2, GFR 60-89 ml/min    Colon polyps    Coronary artery disease    10/18 PCI/DES to p/m LCx with cutting balloon to mLcx   Diverticulosis of colon    GERD (gastroesophageal reflux disease)    Hip bursitis 2010   Dr Para March, Post op seroma   History of colon polyps    HSV (herpes simplex virus) anogenital infection 07/2019   HTN (hypertension)    IBS (irritable bowel syndrome)    constipation predominant - Dr Earlean Shawl   Lichen sclerosus    Osteopenia 11/2016   T score -2.0 FRAX 15%/4.3%   PAC (premature atrial contraction)    Symptomatiic   Renal insufficiency    Right bundle branch block (RBBB) on electrocardiogram (ECG) 12/27/2017   Scoliosis    SVT (supraventricular tachycardia) ([place])    brief history    Past Surgical History:  Procedure Laterality Date   ANTERIOR AND POSTERIOR VAGINAL REPAIR  01/2002   Archie Endo 01/23/2011   APPENDECTOMY  1948   CARDIAC CATHETERIZATION  06/26/2017   CORONARY ANGIOPLASTY WITH STENT PLACEMENT  07/01/2017   CORONARY ATHERECTOMY N/A 07/01/2017   Procedure: CORONARY ATHERECTOMY;  Surgeon: Belva Crome, MD;  Location: [facility];  Service: Cardiovascular;  Laterality: N/A;   CORONARY STENT INTERVENTION N/A 07/01/2017   Procedure: CORONARY  STENT INTERVENTION;  Surgeon: Belva Crome, MD;  Location: [facility];  Service: Cardiovascular;  Laterality: N/A;   HAMMER TOE SURGERY     HEMORRHOID BANDING     HIP SURGERY Left 04/2009   hip examination under anesthesia followed by greater trochanteric bursectomy; iliotibial band tenotomy/notes 01/20/2011   I & D EXTREMITY Right 01/10/2018   Procedure: IRRIGATION AND DEBRIDEMENT RIGHT KNEE, APPLY WOUND VAC;  Surgeon: Newt Minion, MD;  Location: [facility];  Service: Orthopedics;  Laterality: Right;   KNEE BURSECTOMY Right 04/2009   Archie Endo 01/09/2011   LEFT HEART CATH AND CORONARY ANGIOGRAPHY N/A 06/26/2017    Procedure: LEFT HEART CATH AND CORONARY ANGIOGRAPHY;  Surgeon: Belva Crome, MD;  Location: [facility];  Service: Cardiovascular;  Laterality: N/A;   PUBOVAGINAL SLING  01/2002   Archie Endo 01/23/2011   REDUCTION MAMMAPLASTY     TEMPORARY PACEMAKER N/A 07/01/2017   Procedure: TEMPORARY PACEMAKER;  Surgeon: Belva Crome, MD;  Location: [facility];  Service: Cardiovascular;  Laterality: N/A;   VAGINAL HYSTERECTOMY  01/2002   Vaginal hysterectomy, bilateral salpingo-oophorectomy/notes 01/23/2011    Current Medications: Current Meds  Medication Sig   acetaminophen (TYLENOL) 500 MG tablet Take 1,000 mg by mouth every 6 (six) hours as needed (body aches / sore hip). Maximum of 6 tablets daily (3000mg )   atorvastatin (LIPITOR) 80 MG tablet TAKE 1 TABLET DAILY AT 6PM.   BIOTIN PO Take 5,000 mcg by mouth daily.   diclofenac Sodium (VOLTAREN) 1 % GEL Apply 2 g topically 4 (four) times daily as needed.   dicyclomine (BENTYL) 20 MG tablet Take 1 by mouth every 4-6 hours as needed for cramping   DULoxetine (CYMBALTA) 60 MG capsule Take 1 capsule (60 mg total) by mouth daily. Must keep scheduled appt for future refills   ELIQUIS 2.5 MG TABS tablet TAKE 1 TABLET BY MOUTH TWICE DAILY.   empagliflozin (JARDIANCE) 10 MG TABS tablet Take 1 tablet (10 mg total) by mouth daily before breakfast.   esomeprazole (NEXIUM) 40 MG capsule TAKE (1) CAPSULE DAILY.   fluticasone (FLONASE) 50 MCG/ACT nasal spray Place 2 sprays into both nostrils daily as needed for allergies or rhinitis.   Homeopathic Products (ARNICARE) GEL Apply topically daily as needed.   hyoscyamine (LEVSIN) 0.125 MG tablet TAKE 1-2 TABLETS (0.125-0.25 MG) BY MOUTH EVERY 4 HOURS AS NEEDED FOR UP TO 10 DAYS FOR CRAMPING. (Patient taking differently: Take 0.125-0.25 mg by mouth every 4 (four) hours as needed for cramping.)   LORazepam (ATIVAN) 1 MG tablet TAKE 1 TABLET TWICE DAILY AS NEEDED FOR ANXIETY/SLEEP.   losartan (COZAAR) 25 MG  tablet Take 1 tablet (25 mg total) by mouth daily.   methylcellulose (CITRUCEL) oral powder 2 tablespoons daily   metoprolol succinate (TOPROL-XL) 25 MG 24 hr tablet Take 1 tablet (25 mg total) by mouth daily. Please schedule yearly appointment for future refills. 1st attempt. Thank you   nitroGLYCERIN (NITROSTAT) 0.4 MG SL tablet Place 0.4 mg under the tongue every 5 (five) minutes as needed for chest pain (Call 911 at 3rd dose within 15 minutes.).   ondansetron (ZOFRAN) 4 MG tablet Take 1-2 by mouth every 4-6 hours as needed for nausea   ondansetron (ZOFRAN-ODT) 4 MG disintegrating tablet DISSOLVE 1 TABLET IN MOUTH EVERY 8 HOURS AS NEEDED FOR NAUSEA OR VOMITING.   Peppermint Oil (IBGARD) 90 MG CPCR Take 1 capsule before meals   saccharomyces boulardii (FLORASTOR) 250 MG capsule Take 1  capsule (250 mg total) by mouth 2 (two) times daily. (Patient taking differently: Take 250 mg by mouth daily.)   sodium chloride (OCEAN) 0.65 % SOLN nasal spray Place 1 spray into both nostrils as needed for congestion.   spironolactone (ALDACTONE) 25 MG tablet Take 0.5 tablets (12.5 mg total) by mouth daily.     Allergies:   Macrobid [nitrofurantoin monohyd macro], Meloxicam, Digoxin and related, Keflex [cephalexin], Mobic [meloxicam], and Sulfamethoxazole-trimethoprim   Social History   Socioeconomic History   Marital status: Married    Spouse name: Not on file   Number of children: 2   Years of education: Not on file   Highest education level: Not on file  Occupational History   Occupation: Patent attorney: RETIRED  Tobacco Use   Smoking status: Former    Packs/day: 0.25    Years: 28.00    Pack years: 7.00    Types: Cigarettes    Quit date: 1981    Years since quitting: 41.8   Smokeless tobacco: Never  Vaping Use   Vaping Use: Never used  Substance and Sexual Activity   Alcohol use: Yes    Comment: 7 vodka drinks a week   Drug use: Never   Sexual activity: Not Currently    Birth  control/protection: Surgical    Comment: hysterectomy  Other Topics Concern   Not on file  Social History Narrative   Regular Exercise -  YES         Social Determinants of Health   Financial Resource Strain: Low Risk    Difficulty of Paying Living Expenses: Not hard at all  Food Insecurity: No Food Insecurity   Worried About Charity fundraiser in the Last Year: Never true   Ran Out of Food in the Last Year: Never true  Transportation Needs: No Transportation Needs   Lack of Transportation (Medical): No   Lack of Transportation (Non-Medical): No  Physical Activity: Inactive   Days of Exercise per Week: 0 days   Minutes of Exercise per Session: 0 min  Stress: No Stress Concern Present   Feeling of Stress : Not at all  Social Connections: Socially Integrated   Frequency of Communication with Friends and Family: More than three times a week   Frequency of Social Gatherings with Friends and Family: Twice a week   Attends Religious Services: More than 4 times per year   Active Member of [employer] or Organizations: No   Attends Music therapist: More than 4 times per year   Marital Status: Married     Family History: The patient's family history includes Colon cancer in her mother; Diabetes in her father; Pancreatic cancer in her brother; Prostate cancer in her brother and father; Stomach cancer in her son. There is no history of Heart attack, Stroke, or Esophageal cancer.  ROS:   Please see the history of present illness.   If she stands for prolonged period of time and does housework she gets bilateral flank discomfort that resolves with rest.  No associated chest discomfort. Many questions.  She wants to know if she is a candidate for ablation.  She wants to know if she can get a watchman procedure done to get off of anticoagulation.  All other systems reviewed and are negative.  EKGs/Labs/Other Studies Reviewed:    The following studies were reviewed  today: Continuous monitor completed 07/04/2021: Highlights    Continuous atrial fibrillation with HR range 37-176 bpm, and ave HR75  bpm. Rare PVC's     Patch Wear Time:  6 days and 23 hours (2022-10-03T11:27:23-0400 to 2022-10-10T10:35:12-398)   Atrial Fibrillation occurred continuously (100% burden), ranging from 37-176 bpm (avg of 75 bpm). Isolated VEs were rare (<1.0%), VE Couplets were rare (<1.0%), and no VE Triplets were present. Ventricular Trigeminy was present.    EKG:  EKG not performed today.  Recent Labs: 08/27/2020: B Natriuretic Peptide 320.9 06/21/2021: ALT 8 07/05/2021: Hemoglobin 10.1; NT-Pro BNP 1,355; Platelets 302 07/12/2021: BUN 23; Creatinine, Ser 1.32; Potassium 4.9; Sodium 136  Recent Lipid Panel    Component Value Date/Time   CHOL 171 11/26/2016 0844   TRIG 99 11/26/2016 0844   TRIG 183 (H) 07/26/2006 0903   HDL 90 11/26/2016 0844   CHOLHDL 1.9 11/26/2016 0844   CHOLHDL 2.2 07/04/2014 0528   VLDL 19 07/04/2014 0528   LDLCALC 61 11/26/2016 0844   LDLDIRECT 89.1 06/29/2013 1604    Physical Exam:    VS:  BP 140/90   Pulse (!) 51   Ht 5\' 3"  (1.6 m)   Wt 146 lb 9.6 oz (66.5 kg)   SpO2 99%   BMI 25.97 kg/m     Wt Readings from Last 3 Encounters:  07/18/21 146 lb 9.6 oz (66.5 kg)  06/28/21 143 lb 4 oz (65 kg)  06/15/21 143 lb (64.9 kg)     GEN: Elderly. No acute distress HEENT: Normal NECK: No JVD. LYMPHATICS: No lymphadenopathy CARDIAC: No murmur. IIRR no gallop, or edema. VASCULAR:  Normal Pulses. No bruits. RESPIRATORY:  Clear to auscultation without rales, wheezing or rhonchi  ABDOMEN: Soft, non-tender, non-distended, No pulsatile mass, MUSCULOSKELETAL: No deformity  SKIN: Warm and dry NEUROLOGIC:  Alert and oriented x 3 PSYCHIATRIC:  Normal affect   ASSESSMENT:    1. Acute on chronic diastolic heart failure ([place])   2. Permanent atrial fibrillation ([place])   3. Primary hypertension   4. Coronary artery disease involving native  coronary artery of native heart with angina pectoris ([place])   5. Right bundle branch block (RBBB) on electrocardiogram (ECG)   6. Chronic anticoagulation   7. 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  Lying at 30 degrees, the neck veins can be seen above the clavicle.  She has 1+ bilateral lower extremity edema.  Resume Jardiance, and change Aldactone to 12.5 mg on Monday Wednesday and Friday.  Basic metabolic panel in 1 month. The patient has been researching atrial fibrillation and wants to be considered for a Watchman procedure and ablation.  I discussed with her that longstanding atrial fibrillation is less life-threatening to improve with ablation.  Also that having a left atrial occlusion device is not totally prevent the possibility of stroke off of anticoagulation.  Understanding this, she wants to have the opinion and so therefore will refer the patient to Dr. Lars Mage. I have seen Hailey [name] is a 86 y.o. female in the office today. The patient is felt to be a poor candidate for long-term anticoagulation because of recurring significant bleeding into soft tissues.  Their CHADS-2-Vasc Score and unadjusted Ischemic Stroke Rate (% per year) is equal to 4.8 % stroke rate/year from a score of 4, necessitating a strategy of stroke prevention with either long-term oral anticoagulation or left atrial appendage occlusion therapy. We have discussed their bleeding risk in the context of  their comorbid medical problems, as well as the rationale for referral for evaluation of Watchman left atrial appendage occlusion therapy. While the patient is at high long-term bleeding risk, they may be appropriate for short-term anticoagulation. Based on this individual patient's stroke and bleeding risk, a shared decision has been made to refer the patient for consideration of Watchman left  atrial appendage closure utilizing the [employer] of Cardiology shared decision tool.   Blood pressures not as well-controlled today because of recent discontinuation of diuretic therapy. No anginal complaints.  I do not think the exertion related lower back discomfort is an atypical ischemic symptom. EKG is not repeated Continue Eliquis.  Will discuss with EP.  Her Chads Vasc is greater than 4 (female, vascular disease, CHF, hypertension).  3 to 67-month follow-up with me.  Basic metabolic panel in 1 month.  Electrophysiology opinion concerning watchman/ablation/versus continued rate control and anticoagulation.   Medication Adjustments/Labs and Tests Ordered: Current medicines are reviewed at length with the patient today.  Concerns regarding medicines are outlined above.  No orders of the defined types were placed in this encounter.  No orders of the defined types were placed in this encounter.   There are no Patient Instructions on file for this visit.   Signed, Sinclair Grooms, MD  07/18/2021 1:21 PM    [facility]

## 2021-07-18 ENCOUNTER — Ambulatory Visit: Payer: Medicare Other | Admitting: Interventional Cardiology

## 2021-07-18 ENCOUNTER — Encounter: Payer: Self-pay | Admitting: Interventional Cardiology

## 2021-07-18 ENCOUNTER — Other Ambulatory Visit: Payer: Self-pay

## 2021-07-18 VITALS — BP 140/90 | HR 51 | Ht 63.0 in | Wt 146.6 lb

## 2021-07-18 DIAGNOSIS — I25119 Atherosclerotic heart disease of native coronary artery with unspecified angina pectoris: Secondary | ICD-10-CM | POA: Diagnosis not present

## 2021-07-18 DIAGNOSIS — I4821 Permanent atrial fibrillation: Secondary | ICD-10-CM | POA: Diagnosis not present

## 2021-07-18 DIAGNOSIS — I4510 Unspecified right bundle-branch block: Secondary | ICD-10-CM | POA: Diagnosis not present

## 2021-07-18 DIAGNOSIS — E785 Hyperlipidemia, unspecified: Secondary | ICD-10-CM | POA: Diagnosis not present

## 2021-07-18 DIAGNOSIS — I10 Essential (primary) hypertension: Secondary | ICD-10-CM

## 2021-07-18 DIAGNOSIS — D681 Hereditary factor XI deficiency: Secondary | ICD-10-CM

## 2021-07-18 DIAGNOSIS — Z7901 Long term (current) use of anticoagulants: Secondary | ICD-10-CM | POA: Diagnosis not present

## 2021-07-18 DIAGNOSIS — I5033 Acute on chronic diastolic (congestive) heart failure: Secondary | ICD-10-CM | POA: Diagnosis not present

## 2021-07-18 NOTE — Patient Instructions (Signed)
Medication Instructions:  1) DECREASE Spironolactone to 12.5mg  once daily on Monday, Wednesday and Friday  *If you need a refill on your cardiac medications before your next appointment, please call your pharmacy*   Lab Work: BMET in 1 month  If you have labs (blood work) drawn today and your tests are completely normal, you will receive your results only by: [place] (if you have MyChart) OR A paper copy in the mail If you have any lab test that is abnormal or we need to change your treatment, we will call you to review the results.   Testing/Procedures: None   Follow-Up: At [facility], you and your health needs are our priority.  As part of our continuing mission to provide you with exceptional heart care, we have created designated Provider Care Teams.  These Care Teams include your primary Cardiologist (physician) and Advanced Practice Providers (APPs -  Physician Assistants and Nurse Practitioners) who all work together to provide you with the care you need, when you need it.  We recommend signing up for the patient portal called "MyChart".  Sign up information is provided on this After Visit Summary.  MyChart is used to connect with patients for Virtual Visits (Telemedicine).  Patients are able to view lab/test results, encounter notes, upcoming appointments, etc.  Non-urgent messages can be sent to your provider as well.   To learn more about what you can do with MyChart, go to [email or web address].    Your next appointment:   3-4 month(s)  The format for your next appointment:   In Person  Provider:   Sinclair Grooms, MD     Other Instructions  You have been referred to Dr. Quentin Ore with our Electrophysiology team.

## 2021-07-22 ENCOUNTER — Encounter: Payer: Self-pay | Admitting: Obstetrics and Gynecology

## 2021-07-22 NOTE — Telephone Encounter (Signed)
I understand patient was not contacted by [facility] for an appointment with Dr. Daryll Drown and that she has stated she is feeling better.   I do recommend at least a yearly check of the vulva at this office.  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   Please schedule an appointment for September, 2023.  You may then closed this encounter.

## 2021-07-23 ENCOUNTER — Other Ambulatory Visit: Payer: Self-pay

## 2021-07-23 ENCOUNTER — Emergency Department (HOSPITAL_BASED_OUTPATIENT_CLINIC_OR_DEPARTMENT_OTHER): Payer: Medicare Other | Admitting: Radiology

## 2021-07-23 ENCOUNTER — Emergency Department (HOSPITAL_BASED_OUTPATIENT_CLINIC_OR_DEPARTMENT_OTHER)
Admission: EM | Admit: 2021-07-23 | Discharge: 2021-07-23 | Disposition: A | Discharge status: Home / Self Care | Payer: Medicare Other | Attending: Emergency Medicine | Admitting: Emergency Medicine

## 2021-07-23 ENCOUNTER — Encounter (HOSPITAL_BASED_OUTPATIENT_CLINIC_OR_DEPARTMENT_OTHER): Payer: Self-pay | Admitting: Obstetrics and Gynecology

## 2021-07-23 DIAGNOSIS — I129 Hypertensive chronic kidney disease with stage 1 through stage 4 chronic kidney disease, or unspecified chronic kidney disease: Secondary | ICD-10-CM | POA: Insufficient documentation

## 2021-07-23 DIAGNOSIS — W010XXA Fall on same level from slipping, tripping and stumbling without subsequent striking against object, initial encounter: Secondary | ICD-10-CM | POA: Insufficient documentation

## 2021-07-23 DIAGNOSIS — W19XXXA Unspecified fall, initial encounter: Secondary | ICD-10-CM

## 2021-07-23 DIAGNOSIS — S91012A Laceration without foreign body, left ankle, initial encounter: Secondary | ICD-10-CM | POA: Insufficient documentation

## 2021-07-23 DIAGNOSIS — I25118 Atherosclerotic heart disease of native coronary artery with other forms of angina pectoris: Secondary | ICD-10-CM | POA: Diagnosis not present

## 2021-07-23 DIAGNOSIS — I4891 Unspecified atrial fibrillation: Secondary | ICD-10-CM | POA: Insufficient documentation

## 2021-07-23 DIAGNOSIS — M25531 Pain in right wrist: Secondary | ICD-10-CM | POA: Diagnosis not present

## 2021-07-23 DIAGNOSIS — S81811A Laceration without foreign body, right lower leg, initial encounter: Secondary | ICD-10-CM | POA: Diagnosis not present

## 2021-07-23 DIAGNOSIS — M25571 Pain in right ankle and joints of right foot: Secondary | ICD-10-CM

## 2021-07-23 DIAGNOSIS — Z7901 Long term (current) use of anticoagulants: Secondary | ICD-10-CM

## 2021-07-23 DIAGNOSIS — Z87891 Personal history of nicotine dependence: Secondary | ICD-10-CM | POA: Diagnosis not present

## 2021-07-23 DIAGNOSIS — R0789 Other chest pain: Secondary | ICD-10-CM

## 2021-07-23 DIAGNOSIS — M79641 Pain in right hand: Secondary | ICD-10-CM | POA: Insufficient documentation

## 2021-07-23 DIAGNOSIS — N182 Chronic kidney disease, stage 2 (mild): Secondary | ICD-10-CM | POA: Diagnosis not present

## 2021-07-23 DIAGNOSIS — S81011A Laceration without foreign body, right knee, initial encounter: Secondary | ICD-10-CM | POA: Diagnosis not present

## 2021-07-23 DIAGNOSIS — R079 Chest pain, unspecified: Secondary | ICD-10-CM | POA: Diagnosis not present

## 2021-07-23 DIAGNOSIS — S8991XA Unspecified injury of right lower leg, initial encounter: Secondary | ICD-10-CM | POA: Diagnosis present

## 2021-07-23 DIAGNOSIS — Z79899 Other long term (current) drug therapy: Secondary | ICD-10-CM | POA: Diagnosis not present

## 2021-07-23 DIAGNOSIS — R072 Precordial pain: Secondary | ICD-10-CM | POA: Diagnosis not present

## 2021-07-23 DIAGNOSIS — Z85820 Personal history of malignant melanoma of skin: Secondary | ICD-10-CM | POA: Insufficient documentation

## 2021-07-23 DIAGNOSIS — M25572 Pain in left ankle and joints of left foot: Secondary | ICD-10-CM | POA: Diagnosis not present

## 2021-07-23 DIAGNOSIS — S81801A Unspecified open wound, right lower leg, initial encounter: Secondary | ICD-10-CM | POA: Diagnosis not present

## 2021-07-23 DIAGNOSIS — R52 Pain, unspecified: Secondary | ICD-10-CM

## 2021-07-23 DIAGNOSIS — M25561 Pain in right knee: Secondary | ICD-10-CM | POA: Diagnosis not present

## 2021-07-23 NOTE — ED Triage Notes (Signed)
Patient reports she had a fall yesterday and had a skin tear to the right leg, and has pain on bilateral legs, her wrist, and her rib cage. Patient denies hitting her head or LOC.

## 2021-07-23 NOTE — Discharge Instructions (Signed)
Keep dressing in place and avoid bending right knee Use cam walker for walking Call Dr. Archie Endo office tomorrow for recheck Return if you are having any worsening symptoms especially shortness of breath, headache, or weakness on one side or the other

## 2021-07-23 NOTE — ED Notes (Signed)
Patient transported to X-ray 

## 2021-07-23 NOTE — ED Provider Notes (Signed)
[facility] DEPT Provider Note   CSN: [id number] Arrival date & time: 07/23/21  1129     History Chief Complaint  Patient presents with   Hailey [name] is a 86 y.o. female.  HPI 86 year old female, history of A. fib, on chronic anticoagulation, presents today complaining yesterday.  She is getting ready to go to a wedding when she had a trip and fall.  She had a skin tear to her right knee and her left lower leg.  She had some pain at the right knee, right ankle, and left lower leg.  She was able to get up with assistance.  She is able to ambulate but has some pain in the right knee.  She did not not strike her head.  States that she did go forward and strike her chest.  She has some pain in the right anterior chest.  She denies striking her head.  She has not had any change in level of consciousness.  She denies any neck pain, back pain, hip pain, or pelvis pain.  She reports eating and drinking without difficulty.  She reports that her tetanus shot is up-to-date.     Past Medical History:  Diagnosis Date   Acute blood loss anemia 12/25/2017   Allergic rhinitis    Anxiety    Arthritis    "my whole spine" (07/01/2017)   Atrial fibrillation (HCC)    Bowel obstruction (HCC)    in [place]   Bradycardia with 41-50 beats per minute 12/27/2017   Cancer ([place])    Cellulitis of leg, right with large prepatella hematoma and open wounds 12/26/2017   CKD (chronic kidney disease) stage 2, GFR 60-89 ml/min    Colon polyps    Coronary artery disease    10/18 PCI/DES to p/m LCx with cutting balloon to mLcx   Diverticulosis of colon    GERD (gastroesophageal reflux disease)    Hip bursitis 2010   Dr Para March, Post op seroma   History of colon polyps    HSV (herpes simplex virus) anogenital infection 07/2019   HTN (hypertension)    IBS (irritable bowel syndrome)    constipation predominant - Dr Earlean Shawl   Lichen sclerosus    Osteopenia 11/2016   T score  -2.0 FRAX 15%/4.3%   PAC (premature atrial contraction)    Symptomatiic   Renal insufficiency    Right bundle branch block (RBBB) on electrocardiogram (ECG) 12/27/2017   Scoliosis    SVT (supraventricular tachycardia) ([place])    brief history   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   biopsy showing vulvar atypia, possible VIN I    Patient Active Problem List   Diagnosis Date Noted   Shortness of breath 06/12/2021   Right leg pain 02/21/2021   Diverticulitis 01/08/2021   Acute respiratory failure with hypoxia (HCC)    Bacterial colitis    Abnormal CT of the abdomen    Diarrhea of infectious origin    Acute colitis 08/27/2020   Aortic atherosclerosis ([place]) 08/27/2020   Acute prerenal azotemia 08/27/2020   Shock circulatory ([place]) 67/61/9509   Metabolic acidosis with normal anion gap and bicarbonate losses 08/27/2020   Dehydration    Squamous cell cancer of skin of left cheek 32/67/1245   Lichen sclerosus 80/99/8338   Abdominal cramping 03/16/2020   Neurogenic orthostatic hypotension ([place]) 09/16/2019   Injury of face 08/31/2019   Leg abrasion, infected, right, subsequent encounter 08/28/2019   Face lacerations 08/26/2019  Nose fracture 08/26/2019   Concussion 08/26/2019   Wrist pain, acute, right 08/26/2019   Greater trochanteric pain syndrome of right lower extremity 07/30/2019   Viral URI with cough 08/15/2018   Abdominal pain 05/22/2018   Traumatic hematoma of right knee 01/10/2018   Ulcer of right knee (HCC)    Bradycardia with 41-50 beats per minute 12/27/2017   Right bundle branch block (RBBB) on electrocardiogram (ECG) 12/27/2017   Quadriceps tendon rupture 12/24/2017   CKD (chronic kidney disease), stage II 12/24/2017   Cellulitis of leg, right with large prepatella hematoma and open wounds 12/24/2017   Iliotibial band syndrome of right side 12/16/2017   Depression 07/11/2017   Intervertebral lumbar disc disorder with myelopathy, lumbar region  04/25/2017   Family history of colon cancer in mother 01/22/2017   Primary osteoarthritis of both first carpometacarpal joints 01/02/2017   Pain 11/21/2016   Gastroenteritis 09/13/2016   Chronic anticoagulation 05/24/2015   Coronary artery disease involving native coronary artery of native heart with angina pectoris ([place]) 12/20/2014   Iliotibial band syndrome of left side 11/16/2014   Diverticulitis of colon 08/30/2014   Chest pain, atypical 07/03/2014   Diarrhea 12/22/2013   PAF (paroxysmal atrial fibrillation) ([place]) 11/21/2012   Greater trochanteric bursitis of both hips 05/21/2012   Dizzinesses 11/07/2011   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   Osteoarthritis of left hip 07/03/2010   DEGENERATIVE DISC DISEASE, LUMBOSACRAL SPINE 05/18/2010   SYNCOPE 10/27/2008   Essential hypertension 06/16/2007   GERD (gastroesophageal reflux disease) 06/16/2007   COLONIC POLYPS, HX OF 06/16/2007    Past Surgical History:  Procedure Laterality Date   ANTERIOR AND POSTERIOR VAGINAL REPAIR  01/2002   Archie Endo 01/23/2011   APPENDECTOMY  1948   CARDIAC CATHETERIZATION  06/26/2017   CORONARY ANGIOPLASTY WITH STENT PLACEMENT  07/01/2017   CORONARY ATHERECTOMY N/A 07/01/2017   Procedure: CORONARY ATHERECTOMY;  Surgeon: Belva Crome, MD;  Location: [facility];  Service: Cardiovascular;  Laterality: N/A;   CORONARY STENT INTERVENTION N/A 07/01/2017   Procedure: CORONARY STENT INTERVENTION;  Surgeon: Belva Crome, MD;  Location: [facility];  Service: Cardiovascular;  Laterality: N/A;   HAMMER TOE SURGERY     HEMORRHOID BANDING     HIP SURGERY Left 04/2009   hip examination under anesthesia followed by greater trochanteric bursectomy; iliotibial band tenotomy/notes 01/20/2011   I & D EXTREMITY Right 01/10/2018   Procedure: IRRIGATION AND DEBRIDEMENT RIGHT KNEE, APPLY WOUND VAC;  Surgeon: Newt Minion, MD;  Location: [facility];  Service: Orthopedics;  Laterality: Right;   KNEE BURSECTOMY  Right 04/2009   Archie Endo 01/09/2011   LEFT HEART CATH AND CORONARY ANGIOGRAPHY N/A 06/26/2017   Procedure: LEFT HEART CATH AND CORONARY ANGIOGRAPHY;  Surgeon: Belva Crome, MD;  Location: [facility];  Service: Cardiovascular;  Laterality: N/A;   PUBOVAGINAL SLING  01/2002   Archie Endo 01/23/2011   REDUCTION MAMMAPLASTY     TEMPORARY PACEMAKER N/A 07/01/2017   Procedure: TEMPORARY PACEMAKER;  Surgeon: Belva Crome, MD;  Location: [facility];  Service: Cardiovascular;  Laterality: N/A;   VAGINAL HYSTERECTOMY  01/2002   Vaginal hysterectomy, bilateral salpingo-oophorectomy/notes 01/23/2011     OB History     Gravida  2   Para  2   Term  2   Preterm      AB      Living  1      SAB      IAB  Ectopic      Multiple      Live Births              Family History  Problem Relation Age of Onset   Colon cancer Mother        Dx age 33, died at age 70   Diabetes Father    Prostate cancer Father    Prostate cancer Brother    Pancreatic cancer Brother    Stomach cancer Son    Heart attack Neg Hx    Stroke Neg Hx    Esophageal cancer Neg Hx     Social History   Tobacco Use   Smoking status: Former    Packs/day: 0.25    Years: 28.00    Pack years: 7.00    Types: Cigarettes    Quit date: 1981    Years since quitting: 41.8   Smokeless tobacco: Never  Vaping Use   Vaping Use: Never used  Substance Use Topics   Alcohol use: Yes    Comment: 7 vodka drinks a week   Drug use: Never    Home Medications Prior to Admission medications   Medication Sig Start Date End Date Taking? Authorizing Provider  acetaminophen (TYLENOL) 500 MG tablet Take 1,000 mg by mouth every 6 (six) hours as needed (body aches / sore hip). Maximum of 6 tablets daily (3000mg )    [provider]  atorvastatin (LIPITOR) 80 MG tablet TAKE 1 TABLET DAILY AT 6PM. 04/03/21   Belva Crome, MD  BIOTIN PO Take 5,000 mcg by mouth daily.    [provider]  diclofenac  Sodium (VOLTAREN) 1 % GEL Apply 2 g topically 4 (four) times daily as needed.    [provider]  dicyclomine (BENTYL) 20 MG tablet Take 1 by mouth every 4-6 hours as needed for cramping 06/19/21   Irene Shipper, MD  DULoxetine (CYMBALTA) 60 MG capsule Take 1 capsule (60 mg total) by mouth daily. Must keep scheduled appt for future refills 07/04/21   Plotnikov, Evie Lacks, MD  ELIQUIS 2.5 MG TABS tablet TAKE 1 TABLET BY MOUTH TWICE DAILY. 02/13/21   Belva Crome, MD  empagliflozin (JARDIANCE) 10 MG TABS tablet Take 1 tablet (10 mg total) by mouth daily before breakfast. 07/04/21   Belva Crome, MD  esomeprazole (NEXIUM) 40 MG capsule TAKE (1) CAPSULE DAILY. 12/26/20   Plotnikov, Evie Lacks, MD  fluticasone (FLONASE) 50 MCG/ACT nasal spray Place 2 sprays into both nostrils daily as needed for allergies or rhinitis.    [provider]  Homeopathic Products (ARNICARE) GEL Apply topically daily as needed.    [provider]  hyoscyamine (LEVSIN) 0.125 MG tablet TAKE 1-2 TABLETS (0.125-0.25 MG) BY MOUTH EVERY 4 HOURS AS NEEDED FOR UP TO 10 DAYS FOR CRAMPING. Patient taking differently: Take 0.125-0.25 mg by mouth every 4 (four) hours as needed for cramping. 03/27/20   Plotnikov, Evie Lacks, MD  LORazepam (ATIVAN) 1 MG tablet TAKE 1 TABLET TWICE DAILY AS NEEDED FOR ANXIETY/SLEEP. 11/13/20   Plotnikov, Evie Lacks, MD  losartan (COZAAR) 25 MG tablet Take 1 tablet (25 mg total) by mouth daily. 07/07/20   Belva Crome, MD  methylcellulose (CITRUCEL) oral powder 2 tablespoons daily 06/28/21   Irene Shipper, MD  metoprolol succinate (TOPROL-XL) 25 MG 24 hr tablet Take 1 tablet (25 mg total) by mouth daily. Please schedule yearly appointment for future refills. 1st attempt. Thank you 06/27/21   Belva Crome, MD  nitroGLYCERIN (NITROSTAT) 0.4 MG SL tablet Place 0.4 mg under the tongue every 5 (five) minutes as needed for chest pain (Call 911 at 3rd dose within 15 minutes.).    [provider]  ondansetron (ZOFRAN) 4 MG tablet Take 1-2 by mouth every 4-6 hours as needed for nausea 06/19/21   Irene Shipper, MD  ondansetron (ZOFRAN-ODT) 4 MG disintegrating tablet DISSOLVE 1 TABLET IN MOUTH EVERY 8 HOURS AS NEEDED FOR NAUSEA OR VOMITING. 02/16/21   Irene Shipper, MD  Peppermint Oil (IBGARD) 90 MG CPCR Take 1 capsule before meals 06/28/21   Irene Shipper, MD  saccharomyces boulardii (FLORASTOR) 250 MG capsule Take 1 capsule (250 mg total) by mouth 2 (two) times daily. Patient taking differently: Take 250 mg by mouth daily. 08/30/20   Oswald Hillock, MD  sodium chloride (OCEAN) 0.65 % SOLN nasal spray Place 1 spray into both nostrils as needed for congestion.    [provider]  spironolactone (ALDACTONE) 25 MG tablet Take 0.5 tablets (12.5 mg total) by mouth daily. 07/04/21   Belva Crome, MD    Allergies    Macrobid [nitrofurantoin monohyd macro], Meloxicam, Digoxin and related, Keflex [cephalexin], Mobic [meloxicam], and Sulfamethoxazole-trimethoprim  Review of Systems   Review of Systems  All other systems reviewed and are negative.  Physical Exam Updated Vital Signs BP (!) 164/102 (BP Location: Right Arm)   Pulse 64   Temp 97.9 F (36.6 C)   Resp 20   Ht 1.6 m (5\' 3" )   Wt 63.5 kg   SpO2 99%   BMI 24.80 kg/m   Physical Exam Vitals and nursing note reviewed.  Constitutional:      Appearance: Normal appearance.  HENT:     Head: Normocephalic.     Right Ear: External ear normal.     Left Ear: External ear normal.  Eyes:     Extraocular Movements: Extraocular movements intact.     Pupils: Pupils are equal, round, and reactive to light.  Cardiovascular:     Rate and Rhythm: Normal rate. Rhythm irregular.     Pulses: Normal pulses.  Pulmonary:     Effort: Pulmonary effort is normal.     Breath sounds: Normal breath sounds.     Comments: Some tenderness mid sternum with palpation Abdominal:     Palpations: Abdomen is soft.  Musculoskeletal:         General: Normal range of motion.     Cervical back: Normal range of motion and neck supple. No tenderness.     Comments: Large skin tear right knee with mild diffuse tenderness no underlying swelling There is some tenderness of the right ankle Pulses are intact Sensations intact Some tenderness of the left lower leg and left ankle there is a small skin tear on the medial aspect of the left ankle Pulses are intact sensations intact There is some tenderness palpation over the right hand at the thumb with no deformity. No tenderness palpation over cervical, thoracic, or lumbar spine No tenderness palpation of her bilateral hips or pelvis No obvious external signs of trauma of the back, hips, pelvis   Skin:    General: Skin is warm and dry.  Neurological:     General: No focal deficit present.     Mental Status: She is alert.  Psychiatric:        Mood and Affect: Mood normal.    ED Results / Procedures / Treatments   Labs (all labs ordered are  listed, but only abnormal results are displayed) Labs Reviewed - No data to display  EKG None  Radiology DG Chest 2 View  Result Date: 07/23/2021 CLINICAL DATA:  Status post fall with pain EXAM: CHEST - 2 VIEW COMPARISON:  August 28, 2020 FINDINGS: The heart size and mediastinal contours are stable. The aorta is tortuous. Both lungs are clear. The visualized skeletal structures are stable. IMPRESSION: No active cardiopulmonary disease. Electronically Signed   By: Abelardo Diesel M.D.   On: 07/23/2021 15:12   DG Sternum  Result Date: 07/23/2021 CLINICAL DATA:  Status post fall with sternal pain. EXAM: STERNUM - 2+ VIEW COMPARISON:  None. FINDINGS: There is no evidence of fracture or other focal bone lesions. IMPRESSION: Negative. Electronically Signed   By: Abelardo Diesel M.D.   On: 07/23/2021 15:16   DG Wrist Complete Right  Result Date: 07/23/2021 CLINICAL DATA:  Status post fall with right wrist pain. EXAM: RIGHT WRIST -  COMPLETE 3+ VIEW COMPARISON:  None. FINDINGS: There is no evidence of fracture or dislocation. Chondrocalcinosis is identified in the ulnar carpal joint. Soft tissues are unremarkable. IMPRESSION: No acute fracture or dislocation. Electronically Signed   By: Abelardo Diesel M.D.   On: 07/23/2021 15:11   DG Ankle Complete Left  Result Date: 07/23/2021 CLINICAL DATA:  Status post fall with left ankle pain. EXAM: LEFT ANKLE COMPLETE - 3+ VIEW COMPARISON:  None. FINDINGS: There is lucency with mild cortical regularity of the medial malleolus, if the patient has focal pain here, fracture is not excluded. There is no dislocation. IMPRESSION: There is lucency with mild cortical regularity of the medial malleolus, if the patient has focal pain here, fracture is not excluded. Electronically Signed   By: Abelardo Diesel M.D.   On: 07/23/2021 15:14   DG Ankle Complete Right  Result Date: 07/23/2021 CLINICAL DATA:  Status post fall with right ankle pain. EXAM: RIGHT ANKLE - COMPLETE 3+ VIEW COMPARISON:  None. FINDINGS: Small plantar calcaneal spur is noted. Small bony density is identified superior to the distal aspect of the talus. If the patient has focal pain here fracture is not excluded. There is no dislocation. Soft tissues are unremarkable. IMPRESSION: Small plantar calcaneal spur is noted. Small bony density is identified superior to the distal aspect of the talus. If the patient has focal pain here fracture is not excluded. Electronically Signed   By: Abelardo Diesel M.D.   On: 07/23/2021 15:10   DG Knee Complete 4 Views Right  Result Date: 07/23/2021 CLINICAL DATA:  Status post fall with right knee pain. EXAM: RIGHT KNEE - COMPLETE 4+ VIEW COMPARISON:  December 25, 2017 FINDINGS: No evidence of fracture, dislocation, or joint effusion. Chondrocalcinosis of the femoral tibial joint spaces are identified. Soft tissues are unremarkable. IMPRESSION: No acute fracture or dislocation. Electronically Signed   By:  Abelardo Diesel M.D.   On: 07/23/2021 15:15   DG Hand Complete Right  Result Date: 07/23/2021 CLINICAL DATA:  Status post fall with right hand pain. EXAM: RIGHT HAND - COMPLETE 3+ VIEW COMPARISON:  None. FINDINGS: There is no evidence of fracture or dislocation. Chondrocalcinosis of the ulnocarpal joint is noted. Soft tissues are unremarkable. IMPRESSION: No acute fracture or dislocation. Electronically Signed   By: Abelardo Diesel M.D.   On: 07/23/2021 15:17    Procedures Procedures   Medications Ordered in ED Medications - No data to display  ED Course  I have reviewed the triage vital signs and the nursing notes.  Pertinent labs & imaging results that were available during my care of the patient were reviewed by me and considered in my medical decision making (see chart for details).    MDM Rules/Calculators/A&P                          86 year old female on chronic anticoagulation with mechanical fall yesterday.  She has significant skin tear to the right knee.  She has been ambulatory.  He has some pain in her right and left lower leg and right hand as well as anterior chest wall.  Images pending.  Plan Steri-Strip of wound after cleaning. Edges reviewed evidence of fracture of wrist, right knee, right hand and sternum all did not show any evidence of fracture Right ankle with small bone density on talus that cannot be ruled out for fracture.  Attempted to clinically correlate with patient complains of pain with medial lateral and talar palpation.  Plan cam walker and will have her follow-up with her orthopedist, Dr. Noemi Chapel Left ankle also with questionable medial malleolus abnormality, however not tender on my palpation.  Patient feels that she can bear weight on this without difficulty. Final Clinical Impression(s) / ED Diagnoses Final diagnoses:  Fall, initial encounter  Chronic anticoagulation  Chest wall pain  Acute right ankle pain  Skin tear of right lower leg without  complication, initial encounter    Rx / DC Orders ED Discharge Orders     None        Pattricia Boss, MD 07/23/21 1529

## 2021-07-25 ENCOUNTER — Ambulatory Visit: Payer: Medicare Other | Admitting: Orthopedic Surgery

## 2021-07-25 ENCOUNTER — Other Ambulatory Visit: Payer: Self-pay | Admitting: Interventional Cardiology

## 2021-07-25 ENCOUNTER — Other Ambulatory Visit: Payer: Self-pay | Admitting: Internal Medicine

## 2021-07-25 ENCOUNTER — Other Ambulatory Visit: Payer: Self-pay

## 2021-07-25 DIAGNOSIS — S81801D Unspecified open wound, right lower leg, subsequent encounter: Secondary | ICD-10-CM

## 2021-07-25 DIAGNOSIS — I87331 Chronic venous hypertension (idiopathic) with ulcer and inflammation of right lower extremity: Secondary | ICD-10-CM | POA: Diagnosis not present

## 2021-07-25 DIAGNOSIS — L97919 Non-pressure chronic ulcer of unspecified part of right lower leg with unspecified severity: Secondary | ICD-10-CM

## 2021-07-27 ENCOUNTER — Encounter: Payer: Self-pay | Admitting: Internal Medicine

## 2021-07-27 ENCOUNTER — Telehealth: Payer: Self-pay

## 2021-07-27 ENCOUNTER — Encounter: Payer: Self-pay | Admitting: Orthopedic Surgery

## 2021-07-27 NOTE — Telephone Encounter (Signed)
I called pt and offered appt today for eval and she declined. Pt states that she is not having any increased drainage, no increased swelling, no redness at the site and no warmth. Reviewed the s/s to watch for and reassured the pt that she should wear her compression sock with direct contact to the skin and apply a dressing on the outside of the sock to absorb any drainage and to call with any questions we are happy to see her at any time should she change her mind.

## 2021-07-31 NOTE — H&P (View-Only) (Signed)
Electrophysiology Office Note:    Date:  08/01/2021   ID:  Hailey [name], DOB 1935/03/17, MRN [id number]  PCP:  Cassandria Anger, MD  [facility] Cardiologist:  Sinclair Grooms, MD  [facility] Electrophysiologist:  Vickie Epley, MD   Referring MD: Belva Crome, MD   Chief Complaint: Atrial fibrillation  History of Present Illness:    Hailey [name] is a 86 y.o. female who presents for an evaluation of atrial fibrillation at the request of Dr. Tamala Julian. Their medical history includes coronary artery disease, HTN, diastolic HF. She last saw Dr Tamala Julian 11/8/20202.  From historical review it appears that she has been in atrial fibrillation since at least 2016.  She is with her husband today in clinic.  She is very interested in discontinuing her blood thinner if at all possible because of recurrent falls and wounds that bleed.  She has had multiple skin tears after falls that have a lot of trouble with bleeding afterwards.    Past Medical History:  Diagnosis Date   Acute blood loss anemia 12/25/2017   Allergic rhinitis    Anxiety    Arthritis    "my whole spine" (07/01/2017)   Atrial fibrillation (HCC)    Bowel obstruction (HCC)    in [place]   Bradycardia with 41-50 beats per minute 12/27/2017   Cancer ([place])    Cellulitis of leg, right with large prepatella hematoma and open wounds 12/26/2017   CKD (chronic kidney disease) stage 2, GFR 60-89 ml/min    Colon polyps    Coronary artery disease    10/18 PCI/DES to p/m LCx with cutting balloon to mLcx   Diverticulosis of colon    GERD (gastroesophageal reflux disease)    Hip bursitis 2010   Dr Para March, Post op seroma   History of colon polyps    HSV (herpes simplex virus) anogenital infection 07/2019   HTN (hypertension)    IBS (irritable bowel syndrome)    constipation predominant - Dr Earlean Shawl   Lichen sclerosus    Osteopenia 11/2016   T score -2.0 FRAX 15%/4.3%   PAC (premature atrial contraction)     Symptomatiic   Renal insufficiency    Right bundle branch block (RBBB) on electrocardiogram (ECG) 12/27/2017   Scoliosis    SVT (supraventricular tachycardia) ([place])    brief history   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   biopsy showing vulvar atypia, possible VIN I    Past Surgical History:  Procedure Laterality Date   ANTERIOR AND POSTERIOR VAGINAL REPAIR  01/2002   Archie Endo 01/23/2011   APPENDECTOMY  1948   CARDIAC CATHETERIZATION  06/26/2017   CORONARY ANGIOPLASTY WITH STENT PLACEMENT  07/01/2017   CORONARY ATHERECTOMY N/A 07/01/2017   Procedure: CORONARY ATHERECTOMY;  Surgeon: Belva Crome, MD;  Location: [facility];  Service: Cardiovascular;  Laterality: N/A;   CORONARY STENT INTERVENTION N/A 07/01/2017   Procedure: CORONARY STENT INTERVENTION;  Surgeon: Belva Crome, MD;  Location: [facility];  Service: Cardiovascular;  Laterality: N/A;   HAMMER TOE SURGERY     HEMORRHOID BANDING     HIP SURGERY Left 04/2009   hip examination under anesthesia followed by greater trochanteric bursectomy; iliotibial band tenotomy/notes 01/20/2011   I & D EXTREMITY Right 01/10/2018   Procedure: IRRIGATION AND DEBRIDEMENT RIGHT KNEE, APPLY WOUND VAC;  Surgeon: Newt Minion, MD;  Location: [facility];  Service: Orthopedics;  Laterality: Right;   KNEE BURSECTOMY Right 04/2009   /  notes 01/09/2011   LEFT HEART CATH AND CORONARY ANGIOGRAPHY N/A 06/26/2017   Procedure: LEFT HEART CATH AND CORONARY ANGIOGRAPHY;  Surgeon: Belva Crome, MD;  Location: [facility];  Service: Cardiovascular;  Laterality: N/A;   PUBOVAGINAL SLING  01/2002   Archie Endo 01/23/2011   REDUCTION MAMMAPLASTY     TEMPORARY PACEMAKER N/A 07/01/2017   Procedure: TEMPORARY PACEMAKER;  Surgeon: Belva Crome, MD;  Location: [facility];  Service: Cardiovascular;  Laterality: N/A;   VAGINAL HYSTERECTOMY  01/2002   Vaginal hysterectomy, bilateral salpingo-oophorectomy/notes 01/23/2011    Current  Medications: Current Meds  Medication Sig   acetaminophen (TYLENOL) 500 MG tablet Take 1,000 mg by mouth every 6 (six) hours as needed (body aches / sore hip). Maximum of 6 tablets daily (3000mg )   atorvastatin (LIPITOR) 80 MG tablet TAKE 1 TABLET DAILY AT 6PM.   BIOTIN PO Take 5,000 mcg by mouth daily.   diclofenac Sodium (VOLTAREN) 1 % GEL Apply 2 g topically 4 (four) times daily as needed.   dicyclomine (BENTYL) 20 MG tablet Take 1 by mouth every 4-6 hours as needed for cramping   DULoxetine (CYMBALTA) 60 MG capsule Take 1 capsule (60 mg total) by mouth daily. Must keep scheduled appt for future refills   ELIQUIS 2.5 MG TABS tablet TAKE 1 TABLET BY MOUTH TWICE DAILY.   empagliflozin (JARDIANCE) 10 MG TABS tablet Take 1 tablet (10 mg total) by mouth daily before breakfast.   esomeprazole (NEXIUM) 40 MG capsule TAKE ONE CAPSULE ONCE DAILY.   fluticasone (FLONASE) 50 MCG/ACT nasal spray Place 2 sprays into both nostrils daily as needed for allergies or rhinitis.   Homeopathic Products (ARNICARE) GEL Apply topically daily as needed.   hyoscyamine (LEVSIN) 0.125 MG tablet TAKE 1-2 TABLETS (0.125-0.25 MG) BY MOUTH EVERY 4 HOURS AS NEEDED FOR UP TO 10 DAYS FOR CRAMPING. (Patient taking differently: Take 0.125-0.25 mg by mouth every 4 (four) hours as needed for cramping.)   LORazepam (ATIVAN) 1 MG tablet TAKE ONE TABLET BY MOUTH TWICE DAILY AS NEEDED FOR ANXIETY / SLEEP.   losartan (COZAAR) 25 MG tablet TAKE 1 TABLET ONCE DAILY.   methylcellulose (CITRUCEL) oral powder 2 tablespoons daily   metoprolol succinate (TOPROL-XL) 25 MG 24 hr tablet Take 1 tablet (25 mg total) by mouth daily.   nitroGLYCERIN (NITROSTAT) 0.4 MG SL tablet Place 0.4 mg under the tongue every 5 (five) minutes as needed for chest pain (Call 911 at 3rd dose within 15 minutes.).   ondansetron (ZOFRAN) 4 MG tablet Take 1-2 by mouth every 4-6 hours as needed for nausea   ondansetron (ZOFRAN-ODT) 4 MG disintegrating tablet DISSOLVE 1  TABLET IN MOUTH EVERY 8 HOURS AS NEEDED FOR NAUSEA OR VOMITING.   Peppermint Oil (IBGARD) 90 MG CPCR Take 1 capsule before meals   saccharomyces boulardii (FLORASTOR) 250 MG capsule Take 1 capsule (250 mg total) by mouth 2 (two) times daily. (Patient taking differently: Take 250 mg by mouth daily.)   sodium chloride (OCEAN) 0.65 % SOLN nasal spray Place 1 spray into both nostrils as needed for congestion.   spironolactone (ALDACTONE) 25 MG tablet Take 0.5 tablets (12.5 mg total) by mouth daily.     Allergies:   Macrobid [nitrofurantoin monohyd macro], Meloxicam, Digoxin and related, Keflex [cephalexin], Mobic [meloxicam], and Sulfamethoxazole-trimethoprim   Social History   Socioeconomic History   Marital status: Married    Spouse name: Not on file   Number of children: 2   Years of  education: Not on file   Highest education level: Not on file  Occupational History   Occupation: Patent attorney: RETIRED  Tobacco Use   Smoking status: Former    Packs/day: 0.25    Years: 28.00    Pack years: 7.00    Types: Cigarettes    Quit date: 1981    Years since quitting: 41.9   Smokeless tobacco: Never  Vaping Use   Vaping Use: Never used  Substance and Sexual Activity   Alcohol use: Yes    Comment: 7 vodka drinks a week   Drug use: Never   Sexual activity: Not Currently    Birth control/protection: Surgical    Comment: hysterectomy  Other Topics Concern   Not on file  Social History Narrative   Regular Exercise -  YES         Social Determinants of Health   Financial Resource Strain: Low Risk    Difficulty of Paying Living Expenses: Not hard at all  Food Insecurity: No Food Insecurity   Worried About Charity fundraiser in the Last Year: Never true   Ran Out of Food in the Last Year: Never true  Transportation Needs: No Transportation Needs   Lack of Transportation (Medical): No   Lack of Transportation (Non-Medical): No  Physical Activity: Inactive   Days of  Exercise per Week: 0 days   Minutes of Exercise per Session: 0 min  Stress: No Stress Concern Present   Feeling of Stress : Not at all  Social Connections: Socially Integrated   Frequency of Communication with Friends and Family: More than three times a week   Frequency of Social Gatherings with Friends and Family: Twice a week   Attends Religious Services: More than 4 times per year   Active Member of [employer] or Organizations: No   Attends Music therapist: More than 4 times per year   Marital Status: Married     Family History: The patient's family history includes Colon cancer in her mother; Diabetes in her father; Pancreatic cancer in her brother; Prostate cancer in her brother and father; Stomach cancer in her son. There is no history of Heart attack, Stroke, or Esophageal cancer.  ROS:   Please see the history of present illness.    All other systems reviewed and are negative.  EKGs/Labs/Other Studies Reviewed:    The following studies were reviewed today:   December 30, 2020 CTA Aorta  Appendage morphology is a chicken wing, large   EKG:  The ekg ordered today demonstrates atrial fibrillation, right bundle branch block   Recent Labs: 08/27/2020: B Natriuretic Peptide 320.9 06/21/2021: ALT 8 07/05/2021: Hemoglobin 10.1; NT-Pro BNP 1,355; Platelets 302 07/12/2021: BUN 23; Creatinine, Ser 1.32; Potassium 4.9; Sodium 136  Recent Lipid Panel    Component Value Date/Time   CHOL 171 11/26/2016 0844   TRIG 99 11/26/2016 0844   TRIG 183 (H) 07/26/2006 0903   HDL 90 11/26/2016 0844   CHOLHDL 1.9 11/26/2016 0844   CHOLHDL 2.2 07/04/2014 0528   VLDL 19 07/04/2014 0528   LDLCALC 61 11/26/2016 0844   LDLDIRECT 89.1 06/29/2013 1604    Physical Exam:    VS:  BP 136/74    Pulse 69    Ht 5\' 3"  (1.6 m)    Wt 142 lb (64.4 kg)    SpO2 94%    BMI 25.15 kg/m     Wt Readings from Last 3 Encounters:  08/01/21 142 lb (64.4  kg)  07/23/21 140 lb (63.5 kg)  07/18/21 146  lb 9.6 oz (66.5 kg)     GEN:  Well nourished, well developed in no acute distress.  Appears younger than stated age 5: Normal NECK: No JVD; No carotid bruits LYMPHATICS: No lymphadenopathy CARDIAC: Irregularly irregular, no murmurs, rubs, gallops RESPIRATORY:  Clear to auscultation without rales, wheezing or rhonchi  ABDOMEN: Soft, non-tender, non-distended MUSCULOSKELETAL:  No edema; No deformity  SKIN: Warm and dry NEUROLOGIC:  Alert and oriented x 3 PSYCHIATRIC:  Normal affect       ASSESSMENT:    1. PAF (paroxysmal atrial fibrillation) ([place])   2. Coronary artery disease involving native coronary artery of native heart with angina pectoris ([place])   3. Neurogenic orthostatic hypotension (HCC)    PLAN:    In order of problems listed above:  #Permanent atrial fibrillation Rate controlled.  On Eliquis 2.5 mg by mouth twice daily for stroke prophylaxis.  We discussed stroke pathophysiology during today's clinic visit.  We discussed how left atrial appendage occlusion can be used to help mitigate the stroke risk.  She is very interested in avoiding long-term use of anticoagulation because of her history of recurrent falls.  She would like to proceed with watchman scheduling.  She does not need a CT scan given I have reviewed a recent CT chest.  Regarding her atrial fibrillation, I do think she is in permanent atrial fibrillation.  She is well rate controlled.  I would recommend we continue with this strategy for now.  I have seen Hailey [name] in the office today who is being considered for a Watchman left atrial appendage closure device. I believe they will benefit from this procedure given their history of atrial fibrillation, CHA2DS2-VASc score of 4 and unadjusted ischemic stroke rate of 4.8% per year. Unfortunately, the patient is not felt to be a long term anticoagulation candidate secondary to recurrent falls. The patient's chart has been reviewed and I feel that they would  be a candidate for short term oral anticoagulation after Watchman implant.   It is my belief that after undergoing a LAA closure procedure, Hailey [name] will not need long term anticoagulation which eliminates anticoagulation side effects and major bleeding risk.   Procedural risks for the Watchman implant have been reviewed with the patient including a 0.5% risk of stroke, <1% risk of perforation and <1% risk of device embolization.    The published clinical data on the safety and effectiveness of WATCHMAN include but are not limited to the following: - Holmes DR, Mechele Claude, Sick P et al. for the PROTECT AF Investigators. Percutaneous closure of the left atrial appendage versus warfarin therapy for prevention of stroke in patients with atrial fibrillation: a randomised non-inferiority trial. Lancet 2009; 374: 534-42. Mechele Claude, Doshi SK, Abelardo Diesel D et al. on behalf of the PROTECT AF Investigators. Percutaneous Left Atrial Appendage Closure for Stroke Prophylaxis in Patients With Atrial Fibrillation 2.3-Year Follow-up of the PROTECT AF (Watchman Left Atrial Appendage System for Embolic Protection in Patients With Atrial Fibrillation) Trial. Circulation 2013; 127:720-729. - Alli O, Doshi S,  Kar S, Reddy VY, Sievert H et al. Quality of Life Assessment in the Randomized PROTECT AF (Percutaneous Closure of the Left Atrial Appendage Versus Warfarin Therapy for Prevention of Stroke in Patients With Atrial Fibrillation) Trial of Patients at Risk for Stroke With Nonvalvular Atrial Fibrillation. J Am Coll Cardiol 2013; 13:0865-7. - [place], Tarri Abernethy, Norton Blizzard, Whisenant B, Sievert  Varney Biles V. Prospective randomized evaluation of the Watchman left atrial appendage Device in patients with atrial fibrillation versus long-term warfarin therapy; the PREVAIL trial. Journal of the [employer] of Cardiology, Vol. 4, No. 1, 2014, 1-11. - Kar S, Doshi SK, Sadhu A, Horton R, Osorio J et al.  Primary outcome evaluation of a next-generation left atrial appendage closure device: results from the PINNACLE FLX trial. Circulation 2021;143(18)1754-1762.    After today's visit with the patient which was dedicated solely for shared decision making visit regarding LAA closure device, the patient decided to proceed with the LAA appendage closure procedure scheduled to be done in the near future at [facility]   HAS-BLED score 3 Hypertension Yes  Abnormal renal and liver function (Dialysis, transplant, Cr >2.26 mg/dL /Cirrhosis or Bilirubin >2x Normal or AST/ALT/AP >3x Normal) No  Stroke No  Bleeding Yes  Labile INR (Unstable/high INR) No  Elderly (>65) Yes  Drugs or alcohol (? 8 drinks/week, anti-plt or NSAID) No     Total time spent with patient today 65 minutes. This includes reviewing records, evaluating the patient and coordinating care.  Medication Adjustments/Labs and Tests Ordered: Current medicines are reviewed at length with the patient today.  Concerns regarding medicines are outlined above.  Orders Placed This Encounter  Procedures   EKG 12-Lead   No orders of the defined types were placed in this encounter.    Signed, Hilton Cork. Quentin Ore, MD, [facility], [facility] 08/01/2021 10:05 AM    Electrophysiology [facility]

## 2021-07-31 NOTE — Progress Notes (Addendum)
Electrophysiology Office Note:    Date:  09/27/2021   ID:  Hailey [name], DOB 19-Nov-1934, MRN [id number]  PCP:  Cassandria Anger, MD  [facility] Cardiologist:  Sinclair Grooms, MD  [facility] Electrophysiologist:  Vickie Epley, MD   Referring MD: Belva Crome, MD   Chief Complaint: Atrial fibrillation  History of Present Illness:    Hailey [name] is a 86 y.o. female who presents for an evaluation of atrial fibrillation at the request of Dr. Tamala Julian. Their medical history includes coronary artery disease, HTN, diastolic HF. She last saw Dr Tamala Julian 11/8/20202.  From historical review it appears that she has been in atrial fibrillation since at least 2016.  She is with her husband today in clinic.  She is very interested in discontinuing her blood thinner if at all possible because of recurrent falls and wounds that bleed.  She has had multiple skin tears after falls that have a lot of trouble with bleeding afterwards.    Past Medical History:  Diagnosis Date   Acute blood loss anemia 12/25/2017   Allergic rhinitis    Anxiety    Arthritis    "my whole spine" (07/01/2017)   Atrial fibrillation (HCC)    Bowel obstruction (HCC)    in [place]   Bradycardia with 41-50 beats per minute 12/27/2017   Cancer ([place])    Cellulitis of leg, right with large prepatella hematoma and open wounds 12/26/2017   CKD (chronic kidney disease) stage 2, GFR 60-89 ml/min    Colon polyps    Coronary artery disease    10/18 PCI/DES to p/m LCx with cutting balloon to mLcx   Diverticulosis of colon    GERD (gastroesophageal reflux disease)    Hip bursitis 2010   Dr Para March, Post op seroma   History of colon polyps    HSV (herpes simplex virus) anogenital infection 07/2019   HTN (hypertension)    IBS (irritable bowel syndrome)    constipation predominant - Dr Earlean Shawl   Lichen sclerosus    Osteopenia 11/2016   T score -2.0 FRAX 15%/4.3%   PAC (premature atrial contraction)     Symptomatiic   Renal insufficiency    Right bundle branch block (RBBB) on electrocardiogram (ECG) 12/27/2017   Scoliosis    SVT (supraventricular tachycardia) ([place])    brief history   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   biopsy showing vulvar atypia, possible VIN I    Past Surgical History:  Procedure Laterality Date   ANTERIOR AND POSTERIOR VAGINAL REPAIR  01/2002   Archie Endo 01/23/2011   APPENDECTOMY  1948   CARDIAC CATHETERIZATION  06/26/2017   CORONARY ANGIOPLASTY WITH STENT PLACEMENT  07/01/2017   CORONARY ATHERECTOMY N/A 07/01/2017   Procedure: CORONARY ATHERECTOMY;  Surgeon: Belva Crome, MD;  Location: [place] [facility];  Service: Cardiovascular;  Laterality: N/A;   CORONARY STENT INTERVENTION N/A 07/01/2017   Procedure: CORONARY STENT INTERVENTION;  Surgeon: Belva Crome, MD;  Location: [place] [facility];  Service: Cardiovascular;  Laterality: N/A;   HAMMER TOE SURGERY     HEMORRHOID BANDING     HIP SURGERY Left 04/2009   hip examination under anesthesia followed by greater trochanteric bursectomy; iliotibial band tenotomy/notes 01/20/2011   I & D EXTREMITY Right 01/10/2018   Procedure: IRRIGATION AND DEBRIDEMENT RIGHT KNEE, APPLY WOUND VAC;  Surgeon: Newt Minion, MD;  Location: [place];  Service: Orthopedics;  Laterality: Right;   KNEE BURSECTOMY Right 04/2009   /  notes 01/09/2011   LEFT ATRIAL APPENDAGE OCCLUSION N/A 08/24/2021   Procedure: LEFT ATRIAL APPENDAGE OCCLUSION;  Surgeon: Sherren Mocha, MD;  Location: [place] [facility];  Service: Cardiovascular;  Laterality: N/A;   LEFT HEART CATH AND CORONARY ANGIOGRAPHY N/A 06/26/2017   Procedure: LEFT HEART CATH AND CORONARY ANGIOGRAPHY;  Surgeon: Belva Crome, MD;  Location: [place] [facility];  Service: Cardiovascular;  Laterality: N/A;   PUBOVAGINAL SLING  01/2002   Archie Endo 01/23/2011   REDUCTION MAMMAPLASTY     TEE WITHOUT CARDIOVERSION N/A 08/24/2021   Procedure: TRANSESOPHAGEAL ECHOCARDIOGRAM (TEE);   Surgeon: Sherren Mocha, MD;  Location: [place] [facility];  Service: Cardiovascular;  Laterality: N/A;   TEMPORARY PACEMAKER N/A 07/01/2017   Procedure: TEMPORARY PACEMAKER;  Surgeon: Belva Crome, MD;  Location: [place] [facility];  Service: Cardiovascular;  Laterality: N/A;   VAGINAL HYSTERECTOMY  01/2002   Vaginal hysterectomy, bilateral salpingo-oophorectomy/notes 01/23/2011    Current Medications: Current Meds  Medication Sig   acetaminophen (TYLENOL) 500 MG tablet Take 1,000 mg by mouth every 6 (six) hours as needed (body aches / sore hip). Maximum of 6 tablets daily (3000mg )   atorvastatin (LIPITOR) 80 MG tablet TAKE 1 TABLET DAILY AT 6PM. (Patient not taking: Reported on 08/17/2021)   BIOTIN PO Take 5,000 mcg by mouth in the morning.   diclofenac Sodium (VOLTAREN) 1 % GEL Apply 2 g topically 4 (four) times daily as needed (back pain.).   DULoxetine (CYMBALTA) 60 MG capsule Take 1 capsule (60 mg total) by mouth daily. Must keep scheduled appt for future refills   empagliflozin (JARDIANCE) 10 MG TABS tablet Take 1 tablet (10 mg total) by mouth daily before breakfast.   esomeprazole (NEXIUM) 40 MG capsule TAKE ONE CAPSULE ONCE DAILY.   Homeopathic Products (ARNICARE) GEL Apply 1 application topically daily as needed (skin bruising).   hyoscyamine (LEVSIN) 0.125 MG tablet TAKE 1-2 TABLETS (0.125-0.25 MG) BY MOUTH EVERY 4 HOURS AS NEEDED FOR UP TO 10 DAYS FOR CRAMPING. (Patient taking differently: Take 0.125-0.25 mg by mouth every 4 (four) hours as needed for cramping.)   LORazepam (ATIVAN) 1 MG tablet TAKE ONE TABLET BY MOUTH TWICE DAILY AS NEEDED FOR ANXIETY / SLEEP. (Patient taking differently: Take 1-1.5 mg by mouth at bedtime.)   losartan (COZAAR) 25 MG tablet TAKE 1 TABLET ONCE DAILY.   methylcellulose (CITRUCEL) oral powder 2 tablespoons daily (Patient taking differently: Take 1 packet by mouth 3 (three) times a week.)   metoprolol succinate (TOPROL-XL) 25 MG 24 hr tablet Take 1  tablet (25 mg total) by mouth daily.   nitroGLYCERIN (NITROSTAT) 0.4 MG SL tablet Place 0.4 mg under the tongue every 5 (five) minutes as needed for chest pain (Call 911 at 3rd dose within 15 minutes.).   Peppermint Oil (IBGARD) 90 MG CPCR Take 1 capsule before meals (Patient taking differently: Take 1 capsule by mouth 2 (two) times daily before a meal.)   saccharomyces boulardii (FLORASTOR) 250 MG capsule Take 1 capsule (250 mg total) by mouth 2 (two) times daily. (Patient taking differently: Take 250 mg by mouth daily.)   sodium chloride (OCEAN) 0.65 % SOLN nasal spray Place 1 spray into both nostrils at bedtime.   spironolactone (ALDACTONE) 25 MG tablet Take 0.5 tablets (12.5 mg total) by mouth daily. (Patient taking differently: Take 12.5 mg by mouth every Monday, Wednesday, and Friday.)   [DISCONTINUED] dicyclomine (BENTYL) 20 MG tablet Take 1 by mouth every 4-6 hours as needed for cramping   [  DISCONTINUED] ELIQUIS 2.5 MG TABS tablet TAKE 1 TABLET BY MOUTH TWICE DAILY.   [DISCONTINUED] fluticasone (FLONASE) 50 MCG/ACT nasal spray Place 2 sprays into both nostrils daily as needed for allergies or rhinitis.   [DISCONTINUED] ondansetron (ZOFRAN) 4 MG tablet Take 1-2 by mouth every 4-6 hours as needed for nausea (Patient not taking: Reported on 08/16/2021)   [DISCONTINUED] ondansetron (ZOFRAN-ODT) 4 MG disintegrating tablet DISSOLVE 1 TABLET IN MOUTH EVERY 8 HOURS AS NEEDED FOR NAUSEA OR VOMITING.     Allergies:   Macrobid [nitrofurantoin monohyd macro], Meloxicam, Digoxin and related, Keflex [cephalexin], Mobic [meloxicam], and Sulfamethoxazole-trimethoprim   Social History   Socioeconomic History   Marital status: Married    Spouse name: Not on file   Number of children: 2   Years of education: Not on file   Highest education level: Not on file  Occupational History   Occupation: Patent attorney: RETIRED  Tobacco Use   Smoking status: Former    Packs/day: 0.25    Years: 28.00     Pack years: 7.00    Types: Cigarettes    Quit date: 1981    Years since quitting: 42.0   Smokeless tobacco: Never  Vaping Use   Vaping Use: Never used  Substance and Sexual Activity   Alcohol use: Yes    Comment: 7 vodka drinks a week   Drug use: Never   Sexual activity: Not Currently    Birth control/protection: Surgical    Comment: hysterectomy  Other Topics Concern   Not on file  Social History Narrative   Regular Exercise -  YES         Social Determinants of Health   Financial Resource Strain: Low Risk    Difficulty of Paying Living Expenses: Not hard at all  Food Insecurity: No Food Insecurity   Worried About Charity fundraiser in the Last Year: Never true   Ran Out of Food in the Last Year: Never true  Transportation Needs: No Transportation Needs   Lack of Transportation (Medical): No   Lack of Transportation (Non-Medical): No  Physical Activity: Inactive   Days of Exercise per Week: 0 days   Minutes of Exercise per Session: 0 min  Stress: No Stress Concern Present   Feeling of Stress : Not at all  Social Connections: Socially Integrated   Frequency of Communication with Friends and Family: More than three times a week   Frequency of Social Gatherings with Friends and Family: Twice a week   Attends Religious Services: More than 4 times per year   Active Member of [employer] or Organizations: No   Attends Music therapist: More than 4 times per year   Marital Status: Married     Family History: The patient's family history includes Colon cancer in her mother; Diabetes in her father; Pancreatic cancer in her brother; Prostate cancer in her brother and father; Stomach cancer in her son. There is no history of Heart attack, Stroke, or Esophageal cancer.  ROS:   Please see the history of present illness.    All other systems reviewed and are negative.  EKGs/Labs/Other Studies Reviewed:    The following studies were reviewed today:   December 30, 2020 CTA Aorta  Appendage morphology is a chicken wing, large   EKG:  The ekg ordered today demonstrates atrial fibrillation, right bundle branch block   Recent Labs: 06/21/2021: ALT 8 07/05/2021: NT-Pro BNP 1,355 08/15/2021: BUN 21; Creatinine, Ser 1.37; Hemoglobin  10.0; Platelets 365; Potassium 4.2; Sodium 138  Recent Lipid Panel    Component Value Date/Time   CHOL 171 11/26/2016 0844   TRIG 99 11/26/2016 0844   TRIG 183 (H) 07/26/2006 0903   HDL 90 11/26/2016 0844   CHOLHDL 1.9 11/26/2016 0844   CHOLHDL 2.2 07/04/2014 0528   VLDL 19 07/04/2014 0528   LDLCALC 61 11/26/2016 0844   LDLDIRECT 89.1 06/29/2013 1604    Physical Exam:    VS:  BP 136/74   Pulse 69   Ht 5\' 3"  (1.6 m)   Wt 142 lb (64.4 kg)   SpO2 94%   BMI 25.15 kg/m     Wt Readings from Last 3 Encounters:  08/24/21 140 lb (63.5 kg)  08/16/21 140 lb (63.5 kg)  08/01/21 142 lb (64.4 kg)     GEN:  Well nourished, well developed in no acute distress.  Appears younger than stated age 21: Normal NECK: No JVD; No carotid bruits LYMPHATICS: No lymphadenopathy CARDIAC: Irregularly irregular, no murmurs, rubs, gallops RESPIRATORY:  Clear to auscultation without rales, wheezing or rhonchi  ABDOMEN: Soft, non-tender, non-distended MUSCULOSKELETAL:  No edema; No deformity  SKIN: Warm and dry NEUROLOGIC:  Alert and oriented x 3 PSYCHIATRIC:  Normal affect       ASSESSMENT:    1. PAF (paroxysmal atrial fibrillation) ([place])   2. Coronary artery disease involving native coronary artery of native heart with angina pectoris ([place])   3. Neurogenic orthostatic hypotension (HCC)    PLAN:    In order of problems listed above:  #Permanent atrial fibrillation Rate controlled.  On Eliquis 2.5 mg by mouth twice daily for stroke prophylaxis.  We discussed stroke pathophysiology during today's clinic visit.  We discussed how left atrial appendage occlusion can be used to help mitigate the stroke risk.  She is very  interested in avoiding long-term use of anticoagulation because of her history of recurrent falls.  She would like to proceed with watchman scheduling.  She does not need a CT scan given I have reviewed a recent CT chest.  Regarding her atrial fibrillation, I do think she is in permanent atrial fibrillation.  She is well rate controlled.  I would recommend we continue with this strategy for now.  I have seen Hailey [name] in the office today who is being considered for a Watchman left atrial appendage closure device. I believe they will benefit from this procedure given their history of atrial fibrillation, CHA2DS2-VASc score of 4 and unadjusted ischemic stroke rate of 4.8% per year. Unfortunately, the patient is not felt to be a long term anticoagulation candidate secondary to recurrent falls. The patient's chart has been reviewed and I feel that they would be a candidate for short term oral anticoagulation after Watchman implant.   It is my belief that after undergoing a LAA closure procedure, Hailey [name] will not need long term anticoagulation which eliminates anticoagulation side effects and major bleeding risk.   Procedural risks for the Watchman implant have been reviewed with the patient including a 0.5% risk of stroke, <1% risk of perforation and <1% risk of device embolization.    The published clinical data on the safety and effectiveness of WATCHMAN include but are not limited to the following: - Holmes DR, Mechele Claude, Sick P et al. for the PROTECT AF Investigators. Percutaneous closure of the left atrial appendage versus warfarin therapy for prevention of stroke in patients with atrial fibrillation: a randomised non-inferiority trial. Lancet 2009; 374: 534-42. -  Mechele Claude, Doshi SK, Abelardo Diesel D et al. on behalf of the PROTECT AF Investigators. Percutaneous Left Atrial Appendage Closure for Stroke Prophylaxis in Patients With Atrial Fibrillation 2.3-Year Follow-up of the PROTECT AF  (Watchman Left Atrial Appendage System for Embolic Protection in Patients With Atrial Fibrillation) Trial. Circulation 2013; 127:720-729. - Alli O, Doshi S,  Kar S, Reddy VY, Sievert H et al. Quality of Life Assessment in the Randomized PROTECT AF (Percutaneous Closure of the Left Atrial Appendage Versus Warfarin Therapy for Prevention of Stroke in Patients With Atrial Fibrillation) Trial of Patients at Risk for Stroke With Nonvalvular Atrial Fibrillation. J Am Coll Cardiol 2013; 27:7824-2. Vertell Limber DR, Tarri Abernethy, Price M, [place], Sievert H, Doshi S, Huber K, Reddy V. Prospective randomized evaluation of the Watchman left atrial appendage Device in patients with atrial fibrillation versus long-term warfarin therapy; the PREVAIL trial. Journal of the [employer] of Cardiology, Vol. 4, No. 1, 2014, 1-11. - Kar S, Doshi SK, Sadhu A, Horton R, Osorio J et al. Primary outcome evaluation of a next-generation left atrial appendage closure device: results from the PINNACLE FLX trial. Circulation 2021;143(18)1754-1762.    After today's visit with the patient which was dedicated solely for shared decision making visit regarding LAA closure device, the patient decided to proceed with the LAA appendage closure procedure scheduled to be done in the near future at [facility].   HAS-BLED score 3 Hypertension Yes  Abnormal renal and liver function (Dialysis, transplant, Cr >2.26 mg/dL /Cirrhosis or Bilirubin >2x Normal or AST/ALT/AP >3x Normal) No  Stroke No  Bleeding Yes  Labile INR (Unstable/high INR) No  Elderly (>65) Yes  Drugs or alcohol (? 8 drinks/week, anti-plt or NSAID) No     Signed,  Vickie Epley, MD    09/27/2021 10:15 PM      Total time spent with patient today 65 minutes. This includes reviewing records, evaluating the patient and coordinating care.  Medication Adjustments/Labs and Tests Ordered: Current medicines are reviewed at length with the patient today.  Concerns  regarding medicines are outlined above.  Orders Placed This Encounter  Procedures   EKG 12-Lead   No orders of the defined types were placed in this encounter.     Signed, Hailey Cork. Quentin Ore, MD, [facility], [facility] 09/27/2021 10:14 PM    Electrophysiology [facility]     Signed,  Vickie Epley, MD    09/27/2021 10:14 PM      ---------------------------------------------  Hailey [name] 09/27/21  CHA2DS2-VASc Score = 5  The patient's score is based upon: CHF History: 0 HTN History: 1 Diabetes History: 0 Stroke History: 0 Vascular Disease History: 1 Age Score: 2 Gender Score: 1  NYHA I  Hailey Castile T. Quentin Ore, MD, [facility], [facility] Cardiac Electrophysiology

## 2021-08-01 ENCOUNTER — Other Ambulatory Visit: Payer: Self-pay

## 2021-08-01 ENCOUNTER — Encounter: Payer: Self-pay | Admitting: Cardiology

## 2021-08-01 ENCOUNTER — Ambulatory Visit: Payer: Medicare Other | Admitting: Cardiology

## 2021-08-01 VITALS — BP 136/74 | HR 69 | Ht 63.0 in | Wt 142.0 lb

## 2021-08-01 DIAGNOSIS — I25119 Atherosclerotic heart disease of native coronary artery with unspecified angina pectoris: Secondary | ICD-10-CM | POA: Diagnosis not present

## 2021-08-01 DIAGNOSIS — G903 Multi-system degeneration of the autonomic nervous system: Secondary | ICD-10-CM | POA: Diagnosis not present

## 2021-08-01 DIAGNOSIS — I480 Paroxysmal atrial fibrillation: Secondary | ICD-10-CM | POA: Diagnosis not present

## 2021-08-01 NOTE — Patient Instructions (Addendum)
Medication Instructions:  Your physician recommends that you continue on your current medications as directed. Please refer to the Current Medication list given to you today. *If you need a refill on your cardiac medications before your next appointment, please call your pharmacy*  Lab Work: None ordered. If you have labs (blood work) drawn today and your tests are completely normal, you will receive your results only by: [place] (if you have MyChart) OR A paper copy in the mail If you have any lab test that is abnormal or we need to change your treatment, we will call you to review the results.  Testing/Procedures: None ordered.  Follow-Up: At [facility], you and your health needs are our priority.  As part of our continuing mission to provide you with exceptional heart care, we have created designated Provider Care Teams.  These Care Teams include your primary Cardiologist (physician) and Advanced Practice Providers (APPs -  Physician Assistants and Nurse Practitioners) who all work together to provide you with the care you need, when you need it.  Your next appointment:    Lenice Llamas, RN will reach out to you to schedule this procedure.  Katy's direct number is [phone]

## 2021-08-10 ENCOUNTER — Other Ambulatory Visit: Payer: Self-pay | Admitting: Interventional Cardiology

## 2021-08-10 NOTE — Telephone Encounter (Signed)
Prescription refill request for Eliquis received. Indication: Afib  Last office visit:08/01/21 Quentin Ore) Scr: 1.32 (07/12/21) Age: 86 Weight: 64.4kg  Per Fuller Canada, PharmD Eliquis should be refilled for her current 2.5mg  dose, due to history of falls and is pending watchman. Refill sent to requested pharmacy.

## 2021-08-11 ENCOUNTER — Encounter: Payer: Self-pay | Admitting: Orthopedic Surgery

## 2021-08-11 NOTE — Progress Notes (Signed)
Office Visit Note   Patient: Hailey [name]           Date of Birth: 10-29-1934           MRN: [id number] Visit Date: 07/25/2021              Requested by: Cassandria Anger, MD [place],  [place] [address] PCP: Plotnikov, Evie Lacks, MD  Chief Complaint  Patient presents with   Right Leg - Pain    S/p fall  RLE laceration       HPI: Patient is an 86 year old woman who is seen for evaluation for laceration right leg.  Patient went to the emergency department on 07/23/2021 radiographs were obtained.  Patient is on Eliquis.  Assessment & Plan: Visit Diagnoses:  1. Wound of right leg, subsequent encounter   2. Chronic venous hypertension (idiopathic) with ulcer and inflammation of right lower extremity (HCC)     Plan: Patient will wear a compression sock that extends above her knee.  Follow-Up Instructions: Return if symptoms worsen or fail to improve.   Ortho Exam  Patient is alert, oriented, no adenopathy, well-dressed, normal affect, normal respiratory effort. Examination of the her wrist she is no tender to palpation over the scaphoid.  The ankle is tender to palpation of the anterior talofibular ligament.  She does have a skin tear across the knee and leg a compression sock was applied.  Imaging: No results found. No images are attached to the encounter.  Labs: Lab Results  Component Value Date   HGBA1C 5.6 07/04/2014   ESRSEDRATE 31 (H) 08/30/2014   ESRSEDRATE 29 (H) 12/18/2010   ESRSEDRATE 39 (H) 06/14/2009   CRP <1.0 08/17/2019   CRP 0.7 08/30/2014   LABURIC 6.1 06/14/2009   REPTSTATUS 09/01/2020 FINAL 08/27/2020   REPTSTATUS 09/01/2020 FINAL 08/27/2020   CULT  08/27/2020    NO GROWTH 5 DAYS Performed at [facility], [place] [address], [place], [place] [address]    CULT  08/27/2020    NO GROWTH 5 DAYS Performed at [facility] [address] [place], [place] [address]    LABORGA ESCHERICHIA COLI 02/19/2017     Lab  Results  Component Value Date   ALBUMIN 3.9 06/21/2021   ALBUMIN 2.8 (L) 08/30/2020   ALBUMIN 2.7 (L) 08/29/2020    No results found for: MG Lab Results  Component Value Date   VD25OH 32 12/12/2016    No results found for: PREALBUMIN CBC EXTENDED Latest Ref Rng & Units 07/05/2021 06/21/2021 08/30/2020  WBC 3.4 - 10.8 x10E3/uL 6.3 7.4 7.7  RBC 3.77 - 5.28 x10E6/uL 3.65(L) 3.81(L) 3.08(L)  HGB 11.1 - 15.9 g/dL 10.1(L) 10.5(L) 8.8(L)  HCT 34.0 - 46.6 % 31.9(L) 33.4(L) 27.4(L)  PLT 150 - 450 x10E3/uL 302 308.0 200  NEUTROABS 1.4 - 7.7 K/uL - 4.9 -  LYMPHSABS 0.7 - 4.0 K/uL - 1.5 -     There is no height or weight on file to calculate BMI.  Orders:  No orders of the defined types were placed in this encounter.  No orders of the defined types were placed in this encounter.    Procedures: No procedures performed  Clinical Data: No additional findings.  ROS:  All other systems negative, except as noted in the HPI. Review of Systems  Objective: Vital Signs: There were no vitals taken for this visit.  Specialty Comments:  No specialty comments available.  PMFS History: Patient Active Problem List  Diagnosis Date Noted   Shortness of breath 06/12/2021   Right leg pain 02/21/2021   Diverticulitis 01/08/2021   Acute respiratory failure with hypoxia (HCC)    Bacterial colitis    Abnormal CT of the abdomen    Diarrhea of infectious origin    Acute colitis 08/27/2020   Aortic atherosclerosis ([place]) 08/27/2020   Acute prerenal azotemia 08/27/2020   Shock circulatory ([place]) 32/99/2426   Metabolic acidosis with normal anion gap and bicarbonate losses 08/27/2020   Dehydration    Squamous cell cancer of skin of left cheek 83/41/9622   Lichen sclerosus 29/79/8921   Abdominal cramping 03/16/2020   Neurogenic orthostatic hypotension ([place]) 09/16/2019   Injury of face 08/31/2019   Leg abrasion, infected, right, subsequent encounter 08/28/2019   Face lacerations 08/26/2019    Nose fracture 08/26/2019   Concussion 08/26/2019   Wrist pain, acute, right 08/26/2019   Greater trochanteric pain syndrome of right lower extremity 07/30/2019   Viral URI with cough 08/15/2018   Abdominal pain 05/22/2018   Traumatic hematoma of right knee 01/10/2018   Ulcer of right knee (HCC)    Bradycardia with 41-50 beats per minute 12/27/2017   Right bundle branch block (RBBB) on electrocardiogram (ECG) 12/27/2017   Quadriceps tendon rupture 12/24/2017   CKD (chronic kidney disease), stage II 12/24/2017   Cellulitis of leg, right with large prepatella hematoma and open wounds 12/24/2017   Iliotibial band syndrome of right side 12/16/2017   Depression 07/11/2017   Intervertebral lumbar disc disorder with myelopathy, lumbar region 04/25/2017   Family history of colon cancer in mother 01/22/2017   Primary osteoarthritis of both first carpometacarpal joints 01/02/2017   Pain 11/21/2016   Gastroenteritis 09/13/2016   Chronic anticoagulation 05/24/2015   Coronary artery disease involving native coronary artery of native heart with angina pectoris ([place]) 12/20/2014   Iliotibial band syndrome of left side 11/16/2014   Diverticulitis of colon 08/30/2014   Chest pain, atypical 07/03/2014   Diarrhea 12/22/2013   PAF (paroxysmal atrial fibrillation) ([place]) 11/21/2012   Greater trochanteric bursitis of both hips 05/21/2012   Dizzinesses 11/07/2011   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   Osteoarthritis of left hip 07/03/2010   DEGENERATIVE DISC DISEASE, LUMBOSACRAL SPINE 05/18/2010   SYNCOPE 10/27/2008   Essential hypertension 06/16/2007   GERD (gastroesophageal reflux disease) 06/16/2007   COLONIC POLYPS, HX OF 06/16/2007   Past Medical History:  Diagnosis Date   Acute blood loss anemia 12/25/2017   Allergic rhinitis    Anxiety    Arthritis    "my whole spine" (07/01/2017)   Atrial fibrillation (HCC)    Bowel obstruction ([place])    in [place]   Bradycardia with 41-50 beats per  minute 12/27/2017   Cancer ([place])    Cellulitis of leg, right with large prepatella hematoma and open wounds 12/26/2017   CKD (chronic kidney disease) stage 2, GFR 60-89 ml/min    Colon polyps    Coronary artery disease    10/18 PCI/DES to p/m LCx with cutting balloon to mLcx   Diverticulosis of colon    GERD (gastroesophageal reflux disease)    Hip bursitis 2010   Dr Para March, Post op seroma   History of colon polyps    HSV (herpes simplex virus) anogenital infection 07/2019   HTN (hypertension)    IBS (irritable bowel syndrome)    constipation predominant - Dr Earlean Shawl   Lichen sclerosus    Osteopenia 11/2016   T score -2.0 FRAX 15%/4.3%   PAC (premature atrial  contraction)    Symptomatiic   Renal insufficiency    Right bundle branch block (RBBB) on electrocardiogram (ECG) 12/27/2017   Scoliosis    SVT (supraventricular tachycardia) (HCC)    brief history   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   biopsy showing vulvar atypia, possible VIN I    Family History  Problem Relation Age of Onset   Colon cancer Mother        Dx age 16, died at age 86   Diabetes Father    Prostate cancer Father    Prostate cancer Brother    Pancreatic cancer Brother    Stomach cancer Son    Heart attack Neg Hx    Stroke Neg Hx    Esophageal cancer Neg Hx     Past Surgical History:  Procedure Laterality Date   ANTERIOR AND POSTERIOR VAGINAL REPAIR  01/2002   Archie Endo 01/23/2011   APPENDECTOMY  1948   CARDIAC CATHETERIZATION  06/26/2017   CORONARY ANGIOPLASTY WITH STENT PLACEMENT  07/01/2017   CORONARY ATHERECTOMY N/A 07/01/2017   Procedure: CORONARY ATHERECTOMY;  Surgeon: Belva Crome, MD;  Location: [facility];  Service: Cardiovascular;  Laterality: N/A;   CORONARY STENT INTERVENTION N/A 07/01/2017   Procedure: CORONARY STENT INTERVENTION;  Surgeon: Belva Crome, MD;  Location: [facility];  Service: Cardiovascular;  Laterality: N/A;   HAMMER TOE SURGERY      HEMORRHOID BANDING     HIP SURGERY Left 04/2009   hip examination under anesthesia followed by greater trochanteric bursectomy; iliotibial band tenotomy/notes 01/20/2011   I & D EXTREMITY Right 01/10/2018   Procedure: IRRIGATION AND DEBRIDEMENT RIGHT KNEE, APPLY WOUND VAC;  Surgeon: Newt Minion, MD;  Location: [facility];  Service: Orthopedics;  Laterality: Right;   KNEE BURSECTOMY Right 04/2009   Archie Endo 01/09/2011   LEFT HEART CATH AND CORONARY ANGIOGRAPHY N/A 06/26/2017   Procedure: LEFT HEART CATH AND CORONARY ANGIOGRAPHY;  Surgeon: Belva Crome, MD;  Location: [facility];  Service: Cardiovascular;  Laterality: N/A;   PUBOVAGINAL SLING  01/2002   Archie Endo 01/23/2011   REDUCTION MAMMAPLASTY     TEMPORARY PACEMAKER N/A 07/01/2017   Procedure: TEMPORARY PACEMAKER;  Surgeon: Belva Crome, MD;  Location: [facility];  Service: Cardiovascular;  Laterality: N/A;   VAGINAL HYSTERECTOMY  01/2002   Vaginal hysterectomy, bilateral salpingo-oophorectomy/notes 01/23/2011   Social History   Occupational History   Occupation: Patent attorney: RETIRED  Tobacco Use   Smoking status: Former    Packs/day: 0.25    Years: 28.00    Pack years: 7.00    Types: Cigarettes    Quit date: 1981    Years since quitting: 41.9   Smokeless tobacco: Never  Vaping Use   Vaping Use: Never used  Substance and Sexual Activity   Alcohol use: Yes    Comment: 7 vodka drinks a week   Drug use: Never   Sexual activity: Not Currently    Birth control/protection: Surgical    Comment: hysterectomy

## 2021-08-14 ENCOUNTER — Telehealth: Payer: Self-pay

## 2021-08-14 ENCOUNTER — Telehealth: Payer: Self-pay | Admitting: Internal Medicine

## 2021-08-14 DIAGNOSIS — I480 Paroxysmal atrial fibrillation: Secondary | ICD-10-CM

## 2021-08-14 NOTE — Telephone Encounter (Signed)
Patient called today to reschedule her 12/15 follow up appointment with Dr. Henrene Pastor because she is having a Watchman (catheterization) put in on that day.  Next appointment for him is 09/20/21.  Patient states she thinks she had a diverticular flare-up last week and feels like she needs to see him sooner than January.  Is there any other time you can put her in?  If so, please call patient and let her know.  Thank you.

## 2021-08-14 NOTE — Telephone Encounter (Signed)
Pt states she thinks she had a flare of diverticulitis last week. Reports she is still not feeling well, requesting to see Dr. Henrene Pastor. Pt scheduled to see Dr. Henrene Pastor 08/16/21 at 8:40am. Pt aware of appt.

## 2021-08-14 NOTE — Telephone Encounter (Signed)
Spoke with patient in detail. There was a Watchman cancellation on 12/15 and she originally requested that date.  She understands that her case was discussed in the [place] meeting last week and she will need a repeat cCT prior to [place]. She will have STAT BMET (last GFR <40, but per protocol CT is OK if GFR >30) and CBC drawn tomorrow and cCT 12/7 in preparation for Watchman 12/15.  The patient was grateful for call and agrees with plan.

## 2021-08-15 ENCOUNTER — Other Ambulatory Visit: Payer: Self-pay

## 2021-08-15 ENCOUNTER — Other Ambulatory Visit: Payer: Medicare Other | Admitting: *Deleted

## 2021-08-15 ENCOUNTER — Telehealth (HOSPITAL_COMMUNITY): Payer: Self-pay | Admitting: *Deleted

## 2021-08-15 DIAGNOSIS — I480 Paroxysmal atrial fibrillation: Secondary | ICD-10-CM | POA: Diagnosis not present

## 2021-08-15 LAB — CBC WITH DIFFERENTIAL/PLATELET
Basophils Absolute: 0 10*3/uL (ref 0.0–0.2)
Basos: 0 %
EOS (ABSOLUTE): 0.4 10*3/uL (ref 0.0–0.4)
Eos: 8 %
Hematocrit: 30.5 % — ABNORMAL LOW (ref 34.0–46.6)
Hemoglobin: 10 g/dL — ABNORMAL LOW (ref 11.1–15.9)
Lymphocytes Absolute: 1.3 10*3/uL (ref 0.7–3.1)
Lymphs: 24 %
MCH: 27 pg (ref 26.6–33.0)
MCHC: 32.8 g/dL (ref 31.5–35.7)
MCV: 82 fL (ref 79–97)
Monocytes Absolute: 0.6 10*3/uL (ref 0.1–0.9)
Monocytes: 12 %
Neutrophils Absolute: 3 10*3/uL (ref 1.4–7.0)
Neutrophils: 56 %
Platelets: 365 10*3/uL (ref 150–450)
RBC: 3.7 x10E6/uL — ABNORMAL LOW (ref 3.77–5.28)
RDW: 15.4 % (ref 11.7–15.4)
WBC: 5.3 10*3/uL (ref 3.4–10.8)

## 2021-08-15 LAB — BASIC METABOLIC PANEL
BUN/Creatinine Ratio: 15 (ref 12–28)
BUN: 21 mg/dL (ref 8–27)
CO2: 23 mmol/L (ref 20–29)
Calcium: 9.2 mg/dL (ref 8.7–10.3)
Chloride: 105 mmol/L (ref 96–106)
Creatinine, Ser: 1.37 mg/dL — ABNORMAL HIGH (ref 0.57–1.00)
Glucose: 92 mg/dL (ref 70–99)
Potassium: 4.2 mmol/L (ref 3.5–5.2)
Sodium: 138 mmol/L (ref 134–144)
eGFR: 38 mL/min/{1.73_m2} — ABNORMAL LOW (ref >59)

## 2021-08-15 NOTE — Telephone Encounter (Signed)
Reaching out to patient to offer assistance regarding upcoming cardiac imaging study; pt verbalizes understanding of appt date/time, parking situation and where to check in, pre-test NPO status, and verified current allergies; name and call back number provided for further questions should they arise  Gordy Clement RN Navigator Cardiac [facility] [phone] office [phone] cell  Patient aware to arrive at 2pm for her 2:30pm scan.

## 2021-08-16 ENCOUNTER — Encounter: Payer: Self-pay | Admitting: Internal Medicine

## 2021-08-16 ENCOUNTER — Ambulatory Visit (HOSPITAL_COMMUNITY)
Admission: RE | Admit: 2021-08-16 | Discharge: 2021-08-16 | Disposition: A | Discharge status: Home / Self Care | Payer: Medicare Other | Source: Ambulatory Visit | Attending: Cardiology | Admitting: Cardiology

## 2021-08-16 ENCOUNTER — Ambulatory Visit: Payer: Medicare Other | Admitting: Internal Medicine

## 2021-08-16 VITALS — BP 102/70 | HR 71 | Ht 63.0 in | Wt 140.0 lb

## 2021-08-16 DIAGNOSIS — K219 Gastro-esophageal reflux disease without esophagitis: Secondary | ICD-10-CM | POA: Diagnosis not present

## 2021-08-16 DIAGNOSIS — I480 Paroxysmal atrial fibrillation: Secondary | ICD-10-CM | POA: Diagnosis not present

## 2021-08-16 DIAGNOSIS — R109 Unspecified abdominal pain: Secondary | ICD-10-CM

## 2021-08-16 DIAGNOSIS — K589 Irritable bowel syndrome without diarrhea: Secondary | ICD-10-CM | POA: Diagnosis not present

## 2021-08-16 MED ORDER — DICYCLOMINE HCL 20 MG PO TABS: ORAL_TABLET | ORAL | 3 refills | Status: DC

## 2021-08-16 MED ORDER — ONDANSETRON 4 MG PO TBDP: ORAL_TABLET | ORAL | 3 refills | Status: DC

## 2021-08-16 MED ORDER — IOHEXOL 350 MG/ML SOLN
100.0000 mL | Freq: Once | INTRAVENOUS | Status: AC | PRN
  Administered 2021-08-16: 100 mL via INTRAVENOUS

## 2021-08-16 NOTE — Patient Instructions (Signed)
If you are age 84 or older, your body mass index should be between 23-30. Your Body mass index is 24.8 kg/m. If this is out of the aforementioned range listed, please consider follow up with your Primary Care Provider.  If you are age 72 or younger, your body mass index should be between 19-25. Your Body mass index is 24.8 kg/m. If this is out of the aformentioned range listed, please consider follow up with your Primary Care Provider.   ________________________________________________________  The [facility] providers would like to encourage you to use [facility] to communicate with providers for non-urgent requests or questions.  Due to long hold times on the telephone, sending your provider a message by [facility] may be a faster and more efficient way to get a response.  Please allow 48 business hours for a response.  Please remember that this is for non-urgent requests.  _______________________________________________________  We have sent the following medications to your pharmacy for you to pick up at your convenience:  Bentyl, Zofran  Please follow up in three months

## 2021-08-16 NOTE — Progress Notes (Signed)
HISTORY OF PRESENT ILLNESS:  Hailey [name] is a 86 y.o. female who presents today for follow-up.  She was last evaluated June 28, 2021 with an isolated episode of abdominal cramping and loose stools and vomiting.  See that dictation.  She was treated with Citrucel, IBgard, Bentyl as needed, and Zofran as needed.  Follow-up at this time recommended.  Patient states that she had [place] no problems except for one occasion 4 days ago when she developed nausea followed by vomiting and abdominal cramping.  Symptoms lasted several hours.  There was diarrhea.  Following morning she woke with no further issues.  Since that time she is gradually advance her diet.  She has resumed therapies.  Nothing unusual though she did have a rich meal the evening before consisting of steak, onion rings, and [place] fries.  She does tell me that she is having the watchman procedure performed next week  REVIEW OF SYSTEMS:  All non-GI ROS negative unless otherwise stated in the HPI except for sinus and allergy, anxiety, arthritis, back pain, fatigue, muscle cramps, nosebleeds, shortness of breath, ankle swelling, sleeping problems  Past Medical History:  Diagnosis Date   Acute blood loss anemia 12/25/2017   Allergic rhinitis    Anxiety    Arthritis    "my whole spine" (07/01/2017)   Atrial fibrillation (HCC)    Bowel obstruction (HCC)    in [place]   Bradycardia with 41-50 beats per minute 12/27/2017   Cancer ([place])    Cellulitis of leg, right with large prepatella hematoma and open wounds 12/26/2017   CKD (chronic kidney disease) stage 2, GFR 60-89 ml/min    Colon polyps    Coronary artery disease    10/18 PCI/DES to p/m LCx with cutting balloon to mLcx   Diverticulosis of colon    GERD (gastroesophageal reflux disease)    Hip bursitis 2010   Dr Para March, Post op seroma   History of colon polyps    HSV (herpes simplex virus) anogenital infection 07/2019   HTN (hypertension)    IBS (irritable bowel syndrome)     constipation predominant - Dr Earlean Shawl   Lichen sclerosus    Osteopenia 11/2016   T score -2.0 FRAX 15%/4.3%   PAC (premature atrial contraction)    Symptomatiic   Renal insufficiency    Right bundle branch block (RBBB) on electrocardiogram (ECG) 12/27/2017   Scoliosis    SVT (supraventricular tachycardia) ([place])    brief history   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   biopsy showing vulvar atypia, possible VIN I    Past Surgical History:  Procedure Laterality Date   ANTERIOR AND POSTERIOR VAGINAL REPAIR  01/2002   Archie Endo 01/23/2011   APPENDECTOMY  1948   CARDIAC CATHETERIZATION  06/26/2017   CORONARY ANGIOPLASTY WITH STENT PLACEMENT  07/01/2017   CORONARY ATHERECTOMY N/A 07/01/2017   Procedure: CORONARY ATHERECTOMY;  Surgeon: Belva Crome, MD;  Location: [facility];  Service: Cardiovascular;  Laterality: N/A;   CORONARY STENT INTERVENTION N/A 07/01/2017   Procedure: CORONARY STENT INTERVENTION;  Surgeon: Belva Crome, MD;  Location: [facility];  Service: Cardiovascular;  Laterality: N/A;   HAMMER TOE SURGERY     HEMORRHOID BANDING     HIP SURGERY Left 04/2009   hip examination under anesthesia followed by greater trochanteric bursectomy; iliotibial band tenotomy/notes 01/20/2011   I & D EXTREMITY Right 01/10/2018   Procedure: IRRIGATION AND DEBRIDEMENT RIGHT KNEE, APPLY WOUND VAC;  Surgeon: Meridee Score  V, MD;  Location: [facility];  Service: Orthopedics;  Laterality: Right;   KNEE BURSECTOMY Right 04/2009   Archie Endo 01/09/2011   LEFT HEART CATH AND CORONARY ANGIOGRAPHY N/A 06/26/2017   Procedure: LEFT HEART CATH AND CORONARY ANGIOGRAPHY;  Surgeon: Belva Crome, MD;  Location: [facility];  Service: Cardiovascular;  Laterality: N/A;   PUBOVAGINAL SLING  01/2002   Archie Endo 01/23/2011   REDUCTION MAMMAPLASTY     TEMPORARY PACEMAKER N/A 07/01/2017   Procedure: TEMPORARY PACEMAKER;  Surgeon: Belva Crome, MD;  Location: [facility];  Service:  Cardiovascular;  Laterality: N/A;   VAGINAL HYSTERECTOMY  01/2002   Vaginal hysterectomy, bilateral salpingo-oophorectomy/notes 01/23/2011    Social History Hailey [name]  reports that she quit smoking about 41 years ago. Her smoking use included cigarettes. She has a 7.00 pack-year smoking history. She has never used smokeless tobacco. She reports current alcohol use. She reports that she does not use drugs.  family history includes Colon cancer in her mother; Diabetes in her father; Pancreatic cancer in her brother; Prostate cancer in her brother and father; Stomach cancer in her son.  Allergies  Allergen Reactions   Macrobid [Nitrofurantoin Monohyd Macro] Other (See Comments)    Nausea, stomach cramps, fatigue , headache.   Meloxicam Other (See Comments)    Jittery and headache   Digoxin And Related     headaches   Keflex [Cephalexin] Nausea And Vomiting    Nausea, fatigue, and headache.   Mobic [Meloxicam]    Sulfamethoxazole-Trimethoprim Nausea Only       PHYSICAL EXAMINATION: Vital signs: BP 102/70   Pulse 71   Ht 5\' 3"  (1.6 m)   Wt 140 lb (63.5 kg)   SpO2 94%   BMI 24.80 kg/m   Constitutional: generally well-appearing, no acute distress Psychiatric: alert and oriented x3, cooperative Eyes: extraocular movements intact, anicteric, conjunctiva pink Mouth: oral pharynx moist, no lesions Neck: supple no lymphadenopathy Cardiovascular: heart regular rate and rhythm, no murmur Lungs: clear to auscultation bilaterally Abdomen: soft, nontender, nondistended, no obvious ascites, no peritoneal signs, normal bowel sounds, no organomegaly Rectal: Omitted Extremities: no clubbing, cyanosis, or lower extremity edema bilaterally Skin: no lesions on visible extremities Neuro: No focal deficits.  Cranial nerves intact  ASSESSMENT:  1.  Episode of abdominal cramping with nausea vomiting and diarrhea.  Suspect bout of IBS.  Resolved. 2.  Extensive left-sided diverticulosis on  colonoscopy 2013.  Dr. Earlean Shawl  PLAN:  1.  Continue Citrucel 2 tablespoons daily 2.  Continue IBgard before meals 3.  Bentyl as needed.  Refilled 4.  Zofran as needed.  Refilled 5.  Routine office follow-up 3 months

## 2021-08-17 ENCOUNTER — Telehealth: Payer: Medicare Other

## 2021-08-17 ENCOUNTER — Other Ambulatory Visit: Payer: Medicare Other

## 2021-08-18 ENCOUNTER — Other Ambulatory Visit: Payer: Self-pay

## 2021-08-18 DIAGNOSIS — Z7901 Long term (current) use of anticoagulants: Secondary | ICD-10-CM

## 2021-08-18 DIAGNOSIS — I480 Paroxysmal atrial fibrillation: Secondary | ICD-10-CM

## 2021-08-21 ENCOUNTER — Encounter: Payer: Self-pay | Admitting: Orthopedic Surgery

## 2021-08-21 ENCOUNTER — Ambulatory Visit: Payer: Medicare Other | Admitting: Orthopedic Surgery

## 2021-08-21 ENCOUNTER — Other Ambulatory Visit: Payer: Self-pay

## 2021-08-21 DIAGNOSIS — S81801D Unspecified open wound, right lower leg, subsequent encounter: Secondary | ICD-10-CM | POA: Diagnosis not present

## 2021-08-21 NOTE — Telephone Encounter (Signed)
Confirmed instructions with patient and she has no questions at this time. She understands she will be contacted after Covid testing tomorrow only if appointments need to be rearranged.  She was grateful for assistance.

## 2021-08-21 NOTE — Progress Notes (Signed)
Office Visit Note   Patient: Hailey [name]           Date of Birth: Jan 28, 1935           MRN: [id number] Visit Date: 08/21/2021              Requested by: Cassandria Anger, MD [place],  [place] [id number] PCP: Alain Marion, Evie Lacks, MD  Chief Complaint  Patient presents with   Right Leg - Follow-up    Laceration DOI 07/23/21      HPI: Patient is an 86 year old woman who presents in follow-up for traumatic wound right tibial tubercle secondary to a fall and being on Eliquis.  Patient is wearing her knee-high compression stockings.  Patient states that she is being evaluated for a watchman and so she could discontinue her blood thinner.  Assessment & Plan: Visit Diagnoses:  1. Wound of right leg, subsequent encounter     Plan: Continue with current treatment with the compression stockings around-the-clock.  Follow-Up Instructions: Return in about 4 weeks (around 09/18/2021).   Ortho Exam  Patient is alert, oriented, no adenopathy, well-dressed, normal affect, normal respiratory effort. Examination the wound is approximately 50% smaller there is healthy granulation tissue in the wound bed the socket is debriding the fibrinous exudative tissue there is no cellulitis no odor no drainage no signs of infection.  Imaging: No results found. No images are attached to the encounter.  Labs: Lab Results  Component Value Date   HGBA1C 5.6 07/04/2014   ESRSEDRATE 31 (H) 08/30/2014   ESRSEDRATE 29 (H) 12/18/2010   ESRSEDRATE 39 (H) 06/14/2009   CRP <1.0 08/17/2019   CRP 0.7 08/30/2014   LABURIC 6.1 06/14/2009   REPTSTATUS 09/01/2020 FINAL 08/27/2020   REPTSTATUS 09/01/2020 FINAL 08/27/2020   CULT  08/27/2020    NO GROWTH 5 DAYS Performed at [facility], [place] [address], [place], [place] [address]    CULT  08/27/2020    NO GROWTH 5 DAYS Performed at [facility] [address], [place], [place] [address]    LABORGA ESCHERICHIA COLI  02/19/2017     Lab Results  Component Value Date   ALBUMIN 3.9 06/21/2021   ALBUMIN 2.8 (L) 08/30/2020   ALBUMIN 2.7 (L) 08/29/2020    No results found for: MG Lab Results  Component Value Date   VD25OH 32 12/12/2016    No results found for: PREALBUMIN CBC EXTENDED Latest Ref Rng & Units 08/15/2021 07/05/2021 06/21/2021  WBC 3.4 - 10.8 x10E3/uL 5.3 6.3 7.4  RBC 3.77 - 5.28 x10E6/uL 3.70(L) 3.65(L) 3.81(L)  HGB 11.1 - 15.9 g/dL 10.0(L) 10.1(L) 10.5(L)  HCT 34.0 - 46.6 % 30.5(L) 31.9(L) 33.4(L)  PLT 150 - 450 x10E3/uL 365 302 308.0  NEUTROABS 1.4 - 7.0 x10E3/uL 3.0 - 4.9  LYMPHSABS 0.7 - 3.1 x10E3/uL 1.3 - 1.5     There is no height or weight on file to calculate BMI.  Orders:  No orders of the defined types were placed in this encounter.  No orders of the defined types were placed in this encounter.    Procedures: No procedures performed  Clinical Data: No additional findings.  ROS:  All other systems negative, except as noted in the HPI. Review of Systems  Objective: Vital Signs: There were no vitals taken for this visit.  Specialty Comments:  No specialty comments available.  PMFS History: Patient Active Problem List   Diagnosis Date Noted   Shortness of breath  06/12/2021   Right leg pain 02/21/2021   Diverticulitis 01/08/2021   Acute respiratory failure with hypoxia (HCC)    Bacterial colitis    Abnormal CT of the abdomen    Diarrhea of infectious origin    Acute colitis 08/27/2020   Aortic atherosclerosis ([place]) 08/27/2020   Acute prerenal azotemia 08/27/2020   Shock circulatory ([place]) 46/50/3546   Metabolic acidosis with normal anion gap and bicarbonate losses 08/27/2020   Dehydration    Squamous cell cancer of skin of left cheek 56/81/2751   Lichen sclerosus 70/09/7492   Abdominal cramping 03/16/2020   Neurogenic orthostatic hypotension ([place]) 09/16/2019   Injury of face 08/31/2019   Leg abrasion, infected, right, subsequent encounter  08/28/2019   Face lacerations 08/26/2019   Nose fracture 08/26/2019   Concussion 08/26/2019   Wrist pain, acute, right 08/26/2019   Greater trochanteric pain syndrome of right lower extremity 07/30/2019   Viral URI with cough 08/15/2018   Abdominal pain 05/22/2018   Traumatic hematoma of right knee 01/10/2018   Ulcer of right knee (HCC)    Bradycardia with 41-50 beats per minute 12/27/2017   Right bundle branch block (RBBB) on electrocardiogram (ECG) 12/27/2017   Quadriceps tendon rupture 12/24/2017   CKD (chronic kidney disease), stage II 12/24/2017   Cellulitis of leg, right with large prepatella hematoma and open wounds 12/24/2017   Iliotibial band syndrome of right side 12/16/2017   Depression 07/11/2017   Intervertebral lumbar disc disorder with myelopathy, lumbar region 04/25/2017   Family history of colon cancer in mother 01/22/2017   Primary osteoarthritis of both first carpometacarpal joints 01/02/2017   Pain 11/21/2016   Gastroenteritis 09/13/2016   Chronic anticoagulation 05/24/2015   Coronary artery disease involving native coronary artery of native heart with angina pectoris ([place]) 12/20/2014   Iliotibial band syndrome of left side 11/16/2014   Diverticulitis of colon 08/30/2014   Chest pain, atypical 07/03/2014   Diarrhea 12/22/2013   PAF (paroxysmal atrial fibrillation) ([place]) 11/21/2012   Greater trochanteric bursitis of both hips 05/21/2012   Dizzinesses 11/07/2011   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   Osteoarthritis of left hip 07/03/2010   DEGENERATIVE DISC DISEASE, LUMBOSACRAL SPINE 05/18/2010   SYNCOPE 10/27/2008   Essential hypertension 06/16/2007   GERD (gastroesophageal reflux disease) 06/16/2007   COLONIC POLYPS, HX OF 06/16/2007   Past Medical History:  Diagnosis Date   Acute blood loss anemia 12/25/2017   Allergic rhinitis    Anxiety    Arthritis    "my whole spine" (07/01/2017)   Atrial fibrillation (HCC)    Bowel obstruction ([place])    in  [place]   Bradycardia with 41-50 beats per minute 12/27/2017   Cancer ([place])    Cellulitis of leg, right with large prepatella hematoma and open wounds 12/26/2017   CKD (chronic kidney disease) stage 2, GFR 60-89 ml/min    Colon polyps    Coronary artery disease    10/18 PCI/DES to p/m LCx with cutting balloon to mLcx   Diverticulosis of colon    GERD (gastroesophageal reflux disease)    Hip bursitis 2010   Dr Para March, Post op seroma   History of colon polyps    HSV (herpes simplex virus) anogenital infection 07/2019   HTN (hypertension)    IBS (irritable bowel syndrome)    constipation predominant - Dr Earlean Shawl   Lichen sclerosus    Osteopenia 11/2016   T score -2.0 FRAX 15%/4.3%   PAC (premature atrial contraction)    Symptomatiic   Renal  insufficiency    Right bundle branch block (RBBB) on electrocardiogram (ECG) 12/27/2017   Scoliosis    SVT (supraventricular tachycardia) (HCC)    brief history   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   biopsy showing vulvar atypia, possible VIN I    Family History  Problem Relation Age of Onset   Colon cancer Mother        Dx age 60, died at age 37   Diabetes Father    Prostate cancer Father    Prostate cancer Brother    Pancreatic cancer Brother    Stomach cancer Son    Heart attack Neg Hx    Stroke Neg Hx    Esophageal cancer Neg Hx     Past Surgical History:  Procedure Laterality Date   ANTERIOR AND POSTERIOR VAGINAL REPAIR  01/2002   Archie Endo 01/23/2011   APPENDECTOMY  1948   CARDIAC CATHETERIZATION  06/26/2017   CORONARY ANGIOPLASTY WITH STENT PLACEMENT  07/01/2017   CORONARY ATHERECTOMY N/A 07/01/2017   Procedure: CORONARY ATHERECTOMY;  Surgeon: Belva Crome, MD;  Location: [facility];  Service: Cardiovascular;  Laterality: N/A;   CORONARY STENT INTERVENTION N/A 07/01/2017   Procedure: CORONARY STENT INTERVENTION;  Surgeon: Belva Crome, MD;  Location: [facility];  Service: Cardiovascular;   Laterality: N/A;   HAMMER TOE SURGERY     HEMORRHOID BANDING     HIP SURGERY Left 04/2009   hip examination under anesthesia followed by greater trochanteric bursectomy; iliotibial band tenotomy/notes 01/20/2011   I & D EXTREMITY Right 01/10/2018   Procedure: IRRIGATION AND DEBRIDEMENT RIGHT KNEE, APPLY WOUND VAC;  Surgeon: Newt Minion, MD;  Location: [facility];  Service: Orthopedics;  Laterality: Right;   KNEE BURSECTOMY Right 04/2009   Archie Endo 01/09/2011   LEFT HEART CATH AND CORONARY ANGIOGRAPHY N/A 06/26/2017   Procedure: LEFT HEART CATH AND CORONARY ANGIOGRAPHY;  Surgeon: Belva Crome, MD;  Location: [facility];  Service: Cardiovascular;  Laterality: N/A;   PUBOVAGINAL SLING  01/2002   Archie Endo 01/23/2011   REDUCTION MAMMAPLASTY     TEMPORARY PACEMAKER N/A 07/01/2017   Procedure: TEMPORARY PACEMAKER;  Surgeon: Belva Crome, MD;  Location: [facility];  Service: Cardiovascular;  Laterality: N/A;   VAGINAL HYSTERECTOMY  01/2002   Vaginal hysterectomy, bilateral salpingo-oophorectomy/notes 01/23/2011   Social History   Occupational History   Occupation: Patent attorney: RETIRED  Tobacco Use   Smoking status: Former    Packs/day: 0.25    Years: 28.00    Pack years: 7.00    Types: Cigarettes    Quit date: 1981    Years since quitting: 41.9   Smokeless tobacco: Never  Vaping Use   Vaping Use: Never used  Substance and Sexual Activity   Alcohol use: Yes    Comment: 7 vodka drinks a week   Drug use: Never   Sexual activity: Not Currently    Birth control/protection: Surgical    Comment: hysterectomy

## 2021-08-22 ENCOUNTER — Other Ambulatory Visit (HOSPITAL_COMMUNITY)
Admission: RE | Admit: 2021-08-22 | Discharge: 2021-08-22 | Disposition: A | Discharge status: Home / Self Care | Payer: Medicare Other | Source: Ambulatory Visit | Attending: Cardiology | Admitting: Cardiology

## 2021-08-22 DIAGNOSIS — Z01812 Encounter for preprocedural laboratory examination: Secondary | ICD-10-CM | POA: Diagnosis not present

## 2021-08-22 DIAGNOSIS — Z20822 Contact with and (suspected) exposure to covid-19: Secondary | ICD-10-CM | POA: Diagnosis not present

## 2021-08-22 DIAGNOSIS — Z01818 Encounter for other preprocedural examination: Secondary | ICD-10-CM

## 2021-08-23 LAB — SARS CORONAVIRUS 2 (TAT 6-24 HRS): SARS Coronavirus 2: NEGATIVE

## 2021-08-24 ENCOUNTER — Inpatient Hospital Stay (HOSPITAL_COMMUNITY): Payer: Medicare Other

## 2021-08-24 ENCOUNTER — Ambulatory Visit (HOSPITAL_COMMUNITY)
Admission: RE | Admit: 2021-08-24 | Discharge: 2021-08-24 | Disposition: A | Discharge status: Home / Self Care | Payer: Medicare Other | Attending: Cardiology | Admitting: Cardiology

## 2021-08-24 ENCOUNTER — Inpatient Hospital Stay (HOSPITAL_COMMUNITY): Payer: Medicare Other | Admitting: Physician Assistant

## 2021-08-24 ENCOUNTER — Other Ambulatory Visit: Payer: Self-pay

## 2021-08-24 ENCOUNTER — Ambulatory Visit: Payer: Medicare Other | Admitting: Internal Medicine

## 2021-08-24 ENCOUNTER — Encounter (HOSPITAL_COMMUNITY): Payer: Self-pay | Admitting: Cardiology

## 2021-08-24 ENCOUNTER — Ambulatory Visit: Payer: Medicare Other | Admitting: Orthopedic Surgery

## 2021-08-24 ENCOUNTER — Encounter (HOSPITAL_COMMUNITY)
Admission: RE | Disposition: A | Discharge status: Home / Self Care | Payer: Medicare Other | Source: Home / Self Care | Attending: Cardiology

## 2021-08-24 DIAGNOSIS — I4821 Permanent atrial fibrillation: Secondary | ICD-10-CM | POA: Diagnosis present

## 2021-08-24 DIAGNOSIS — N182 Chronic kidney disease, stage 2 (mild): Secondary | ICD-10-CM | POA: Insufficient documentation

## 2021-08-24 DIAGNOSIS — I340 Nonrheumatic mitral (valve) insufficiency: Secondary | ICD-10-CM | POA: Insufficient documentation

## 2021-08-24 DIAGNOSIS — I081 Rheumatic disorders of both mitral and tricuspid valves: Secondary | ICD-10-CM | POA: Diagnosis not present

## 2021-08-24 DIAGNOSIS — I3139 Other pericardial effusion (noninflammatory): Secondary | ICD-10-CM | POA: Insufficient documentation

## 2021-08-24 DIAGNOSIS — I5032 Chronic diastolic (congestive) heart failure: Secondary | ICD-10-CM | POA: Diagnosis not present

## 2021-08-24 DIAGNOSIS — R296 Repeated falls: Secondary | ICD-10-CM | POA: Insufficient documentation

## 2021-08-24 DIAGNOSIS — Z006 Encounter for examination for normal comparison and control in clinical research program: Secondary | ICD-10-CM

## 2021-08-24 DIAGNOSIS — I130 Hypertensive heart and chronic kidney disease with heart failure and stage 1 through stage 4 chronic kidney disease, or unspecified chronic kidney disease: Secondary | ICD-10-CM | POA: Diagnosis not present

## 2021-08-24 DIAGNOSIS — I25119 Atherosclerotic heart disease of native coronary artery with unspecified angina pectoris: Secondary | ICD-10-CM | POA: Insufficient documentation

## 2021-08-24 DIAGNOSIS — Z955 Presence of coronary angioplasty implant and graft: Secondary | ICD-10-CM | POA: Diagnosis not present

## 2021-08-24 DIAGNOSIS — K219 Gastro-esophageal reflux disease without esophagitis: Secondary | ICD-10-CM | POA: Insufficient documentation

## 2021-08-24 DIAGNOSIS — G903 Multi-system degeneration of the autonomic nervous system: Secondary | ICD-10-CM | POA: Insufficient documentation

## 2021-08-24 DIAGNOSIS — Z7901 Long term (current) use of anticoagulants: Secondary | ICD-10-CM

## 2021-08-24 DIAGNOSIS — I129 Hypertensive chronic kidney disease with stage 1 through stage 4 chronic kidney disease, or unspecified chronic kidney disease: Secondary | ICD-10-CM | POA: Diagnosis not present

## 2021-08-24 DIAGNOSIS — I10 Essential (primary) hypertension: Secondary | ICD-10-CM | POA: Diagnosis present

## 2021-08-24 DIAGNOSIS — I480 Paroxysmal atrial fibrillation: Secondary | ICD-10-CM

## 2021-08-24 HISTORY — PX: LEFT ATRIAL APPENDAGE OCCLUSION: EP1229

## 2021-08-24 HISTORY — PX: TEE WITHOUT CARDIOVERSION: SHX5443

## 2021-08-24 LAB — SURGICAL PCR SCREEN
MRSA, PCR: NEGATIVE
Staphylococcus aureus: NEGATIVE

## 2021-08-24 LAB — POCT ACTIVATED CLOTTING TIME: Activated Clotting Time: 281 seconds

## 2021-08-24 LAB — TYPE AND SCREEN
ABO/RH(D): A POS
Antibody Screen: NEGATIVE

## 2021-08-24 SURGERY — LEFT ATRIAL APPENDAGE OCCLUSION
Anesthesia: General

## 2021-08-24 MED ORDER — CHLORHEXIDINE GLUCONATE 0.12 % MT SOLN
OROMUCOSAL | Status: AC
  Filled 2021-08-24: qty 15

## 2021-08-24 MED ORDER — PHENYLEPHRINE HCL-NACL 20-0.9 MG/250ML-% IV SOLN
INTRAVENOUS | Status: DC | PRN
  Administered 2021-08-24: 50 ug/min via INTRAVENOUS

## 2021-08-24 MED ORDER — HEPARIN SODIUM (PORCINE) 1000 UNIT/ML IJ SOLN
INTRAMUSCULAR | Status: DC | PRN
  Administered 2021-08-24: 9000 [IU] via INTRAVENOUS

## 2021-08-24 MED ORDER — ROCURONIUM BROMIDE 10 MG/ML (PF) SYRINGE
PREFILLED_SYRINGE | INTRAVENOUS | Status: DC | PRN
  Administered 2021-08-24: 50 mg via INTRAVENOUS
  Administered 2021-08-24: 15 mg via INTRAVENOUS

## 2021-08-24 MED ORDER — SUGAMMADEX SODIUM 200 MG/2ML IV SOLN
INTRAVENOUS | Status: DC | PRN
  Administered 2021-08-24: 120 mg via INTRAVENOUS

## 2021-08-24 MED ORDER — HEPARIN (PORCINE) IN NACL 2000-0.9 UNIT/L-% IV SOLN
INTRAVENOUS | Status: DC | PRN
  Administered 2021-08-24: 1000 mL

## 2021-08-24 MED ORDER — EPHEDRINE SULFATE-NACL 50-0.9 MG/10ML-% IV SOSY
PREFILLED_SYRINGE | INTRAVENOUS | Status: DC | PRN
  Administered 2021-08-24: 5 mg via INTRAVENOUS
  Administered 2021-08-24: 10 mg via INTRAVENOUS
  Administered 2021-08-24 (×2): 5 mg via INTRAVENOUS

## 2021-08-24 MED ORDER — LIDOCAINE 2% (20 MG/ML) 5 ML SYRINGE
INTRAMUSCULAR | Status: DC | PRN
  Administered 2021-08-24: 40 mg via INTRAVENOUS

## 2021-08-24 MED ORDER — ONDANSETRON HCL 4 MG/2ML IJ SOLN
INTRAMUSCULAR | Status: DC | PRN
  Administered 2021-08-24: 4 mg via INTRAVENOUS

## 2021-08-24 MED ORDER — SODIUM CHLORIDE 0.9 % IV SOLN: INTRAVENOUS | Status: DC

## 2021-08-24 MED ORDER — APIXABAN 2.5 MG PO TABS
2.5000 mg | ORAL_TABLET | ORAL | Status: AC
  Administered 2021-08-24: 2.5 mg via ORAL
  Filled 2021-08-24: qty 1

## 2021-08-24 MED ORDER — LACTATED RINGERS IV SOLN: INTRAVENOUS | Status: DC

## 2021-08-24 MED ORDER — HEPARIN (PORCINE) IN NACL 1000-0.9 UT/500ML-% IV SOLN
INTRAVENOUS | Status: DC | PRN
  Administered 2021-08-24: 500 mL

## 2021-08-24 MED ORDER — IOHEXOL 350 MG/ML SOLN
INTRAVENOUS | Status: DC | PRN
  Administered 2021-08-24: 15 mL via INTRA_ARTERIAL

## 2021-08-24 MED ORDER — DEXAMETHASONE SODIUM PHOSPHATE 10 MG/ML IJ SOLN
INTRAMUSCULAR | Status: DC | PRN
Start: 2021-08-24 — End: 2021-08-24
  Administered 2021-08-24: 4 mg via INTRAVENOUS

## 2021-08-24 MED ORDER — VANCOMYCIN HCL IN DEXTROSE 1-5 GM/200ML-% IV SOLN
1000.0000 mg | INTRAVENOUS | Status: AC
  Administered 2021-08-24: 1000 mg via INTRAVENOUS
  Filled 2021-08-24: qty 200

## 2021-08-24 MED ORDER — FENTANYL CITRATE (PF) 100 MCG/2ML IJ SOLN
INTRAMUSCULAR | Status: AC
  Filled 2021-08-24: qty 2

## 2021-08-24 MED ORDER — PROPOFOL 10 MG/ML IV BOLUS
INTRAVENOUS | Status: DC | PRN
  Administered 2021-08-24: 130 mg via INTRAVENOUS

## 2021-08-24 MED ORDER — PROTAMINE SULFATE 10 MG/ML IV SOLN
INTRAVENOUS | Status: DC | PRN
  Administered 2021-08-24: 35 mg via INTRAVENOUS

## 2021-08-24 SURGICAL SUPPLY — 16 items
CATH DIAG 6FR PIGTAIL ANGLED (CATHETERS) ×2 IMPLANT
CLOSURE PERCLOSE PROSTYLE (VASCULAR PRODUCTS) ×4 IMPLANT
DEVICE WATCHMAN FLX PROC (KITS) IMPLANT
DILATOR VESSEL 38 20CM 16FR (INTRODUCER) ×2 IMPLANT
KIT HEART LEFT (KITS) ×3 IMPLANT
KIT SHEA VERSACROSS LAAC CONNE (KITS) ×2 IMPLANT
PACK CARDIAC CATHETERIZATION (CUSTOM PROCEDURE TRAY) ×3 IMPLANT
PAD DEFIB RADIO PHYSIO CONN (PAD) ×3 IMPLANT
SHEATH PERFORMER 16FR 30 (SHEATH) ×2 IMPLANT
SHEATH PINNACLE 8F 10CM (SHEATH) ×2 IMPLANT
SHEATH PROBE COVER 6X72 (BAG) ×5 IMPLANT
TRANSDUCER W/STOPCOCK (MISCELLANEOUS) ×3 IMPLANT
TUBING CIL FLEX 10 FLL-RA (TUBING) ×3 IMPLANT
WATCHMAN FLX PROCEDURE DEVICE (KITS) ×3 IMPLANT
WATCHMAN PROCED TRUSEAL ACCESS (SHEATH) ×2 IMPLANT
WATCHMAN TRUSEAL DOUBLE CURVE (SHEATH) ×2 IMPLANT

## 2021-08-24 NOTE — Anesthesia Postprocedure Evaluation (Signed)
Anesthesia Post Note  Patient: LOVEAH LIKE  Procedure(s) Performed: LEFT ATRIAL APPENDAGE OCCLUSION TRANSESOPHAGEAL ECHOCARDIOGRAM (TEE)     Patient location during evaluation: PACU Anesthesia Type: General Level of consciousness: awake and alert Pain management: pain level controlled Vital Signs Assessment: post-procedure vital signs reviewed and stable Respiratory status: spontaneous breathing, nonlabored ventilation, respiratory function stable and patient connected to nasal cannula oxygen Cardiovascular status: blood pressure returned to baseline and stable Postop Assessment: no apparent nausea or vomiting Anesthetic complications: no   No notable events documented.  Last Vitals:  Vitals:   08/24/21 1310 08/24/21 1320  BP: 125/78   Pulse: 64 (!) 58  Resp:    Temp:    SpO2: 98% 98%    Last Pain:  Vitals:   08/24/21 1258  TempSrc:   PainSc: 0-No pain                 Belenda Cruise P Lachlan Pelto

## 2021-08-24 NOTE — Progress Notes (Signed)
Ambulated patient in hall. Right groin remains stable. Site soft, dressing clean , dry, intact.

## 2021-08-24 NOTE — Anesthesia Procedure Notes (Signed)
Procedure Name: Intubation Date/Time: 08/24/2021 9:39 AM Performed by: Renato Shin, CRNA Pre-anesthesia Checklist: Patient identified, Emergency Drugs available, Suction available and Patient being monitored Patient Re-evaluated:Patient Re-evaluated prior to induction Oxygen Delivery Method: Circle system utilized Preoxygenation: Pre-oxygenation with 100% oxygen Induction Type: IV induction Ventilation: Mask ventilation without difficulty Laryngoscope Size: Miller and 3 Grade View: Grade II Tube type: Oral Tube size: 7.0 mm Number of attempts: 1 Airway Equipment and Method: Stylet and Oral airway Placement Confirmation: ETT inserted through vocal cords under direct vision, positive ETCO2 and breath sounds checked- equal and bilateral Secured at: 21 cm Tube secured with: Tape Dental Injury: Teeth and Oropharynx as per pre-operative assessment

## 2021-08-24 NOTE — Anesthesia Preprocedure Evaluation (Addendum)
Anesthesia Evaluation  Patient identified by MRN, date of birth, ID band Patient awake    Reviewed: Allergy & Precautions, NPO status , Patient's Chart, lab work & pertinent test results  Airway Mallampati: II  TM Distance: >3 FB Neck ROM: Full    Dental no notable dental hx.    Pulmonary neg pulmonary ROS, former smoker,    Pulmonary exam normal        Cardiovascular hypertension, Pt. on medications and Pt. on home beta blockers + CAD and + Cardiac Stents  + dysrhythmias (on Eliquis) Atrial Fibrillation  Rhythm:Regular Rate:Normal     Neuro/Psych Anxiety Depression negative neurological ROS     GI/Hepatic Neg liver ROS, GERD  Medicated,  Endo/Other  negative endocrine ROS  Renal/GU CRFRenal disease  negative genitourinary   Musculoskeletal  (+) Arthritis , Osteoarthritis,    Abdominal Normal abdominal exam  (+)   Peds  Hematology  (+) anemia ,   Anesthesia Other Findings   Reproductive/Obstetrics                            Anesthesia Physical Anesthesia Plan  ASA: 3  Anesthesia Plan: General   Post-op Pain Management:    Induction: Intravenous  PONV Risk Score and Plan: 3 and Ondansetron, Dexamethasone and Treatment may vary due to age or medical condition  Airway Management Planned: Mask and Oral ETT  Additional Equipment: None  Intra-op Plan:   Post-operative Plan: Extubation in OR  Informed Consent: I have reviewed the patients History and Physical, chart, labs and discussed the procedure including the risks, benefits and alternatives for the proposed anesthesia with the patient or authorized representative who has indicated his/her understanding and acceptance.     Dental advisory given  Plan Discussed with: CRNA  Anesthesia Plan Comments: (ECHO 06/21: 1. Left ventricular ejection fraction, by estimation, is 65 to 70%. The  left ventricle has normal function. The  left ventricle has no regional  wall motion abnormalities. There is mild concentric left ventricular  hypertrophy. Left ventricular diastolic  parameters are consistent with Grade II diastolic dysfunction  (pseudonormalization). Elevated left atrial pressure.  2. Right ventricular systolic function is moderately reduced. The right  ventricular size is moderately enlarged. There is moderately elevated  pulmonary artery systolic pressure. The estimated right ventricular  systolic pressure is 00.9 mmHg.  3. Left atrial size was moderately dilated.  4. Right atrial size was moderately dilated.  5. The mitral valve is normal in structure. Mild mitral valve  regurgitation. No evidence of mitral stenosis.  6. Tricuspid valve regurgitation is moderate.  7. The aortic valve is normal in structure. Aortic valve regurgitation is  not visualized. Mild to moderate aortic valve sclerosis/calcification is  present, without any evidence of aortic stenosis.  8. The inferior vena cava is dilated in size with >50% respiratory  variability, suggesting right atrial pressure of 8 mmHg.  Lab Results      Component                Value               Date                      WBC                      5.3  08/15/2021                HGB                      10.0 (L)            08/15/2021                HCT                      30.5 (L)            08/15/2021                MCV                      82                  08/15/2021                PLT                      365                 08/15/2021           Lab Results      Component                Value               Date                      NA                       138                 08/15/2021                K                        4.2                 08/15/2021                CO2                      23                  08/15/2021                GLUCOSE                  92                  08/15/2021                BUN                       21                  08/15/2021                CREATININE               1.37 (H)            08/15/2021  CALCIUM                  9.2                 08/15/2021                EGFR                     38 (L)              08/15/2021                GFRNONAA                 >60                 08/30/2020          )       Anesthesia Quick Evaluation

## 2021-08-24 NOTE — Progress Notes (Signed)
Ambulated patient at the end of bedrest to restroom. Observed patient actively bleeding from right groin. Assisted patient back to bed and applied firm pressure to right groin. Observed right groin after taking dressing off and site was note actively bleeding. MD notified. Lying patient flat for 1 hour and will ambulate after bedrest. Will continue to monitor.

## 2021-08-24 NOTE — Transfer of Care (Signed)
Immediate Anesthesia Transfer of Care Note  Patient: Hailey [name]  Procedure(s) Performed: LEFT ATRIAL APPENDAGE OCCLUSION TRANSESOPHAGEAL ECHOCARDIOGRAM (TEE)  Patient Location: PACU and Cath Lab  Anesthesia Type:General  Level of Consciousness: awake and patient cooperative  Airway & Oxygen Therapy: Patient Spontanous Breathing and Patient connected to nasal cannula oxygen  Post-op Assessment: Report given to RN and Post -op Vital signs reviewed and stable  Post vital signs: Reviewed and stable  Last Vitals:  Vitals Value Taken Time  BP    Temp    Pulse    Resp    SpO2      Last Pain:  Vitals:   08/24/21 0808  TempSrc:   PainSc: 0-No pain         Complications: No notable events documented.

## 2021-08-24 NOTE — Interval H&P Note (Signed)
History and Physical Interval Note:  08/24/2021 9:03 AM  Hailey [name]  has presented today for surgery, with the diagnosis of afib.  The various methods of treatment have been discussed with the patient and family. After consideration of risks, benefits and other options for treatment, the patient has consented to  Procedure(s): LEFT ATRIAL APPENDAGE OCCLUSION (N/A) TRANSESOPHAGEAL ECHOCARDIOGRAM (TEE) (N/A) as a surgical intervention.  The patient's history has been reviewed, patient examined, no change in status, stable for surgery.  I have reviewed the patient's chart and labs.  Questions were answered to the patient's satisfaction.     Larone Kliethermes T Ameliya Nicotra

## 2021-08-24 NOTE — Discharge Summary (Signed)
[facility]    Patient ID: Hailey [name],  MRN: [id number], DOB/AGE: 1935/07/29 86 y.o.  Admit date: 08/24/2021 Discharge date: 08/24/2021  Primary Care Physician: Cassandria Anger, MD  Primary Cardiologist: Sinclair Grooms, MD  Electrophysiologist: Vickie Epley, MD  Primary Discharge Diagnosis:  Permanent Atrial Fibrillation felt to be a poor candidate for long term anticoagulation due to frequent falls with chronic bleeding wounds.   Secondary Discharge Diagnosis:  CAD s/p PCI DES of RCA 2018, HTN, CKD stage II, GERD, hx of frequent falls and chronic wounds and and PAF on chronic anticoagulation  Procedures This Admission:   Surgeon: Sherren Mocha, MD Co-Surgeon: Lars Mage, MD Imager: Sanda Klein, MD   Procedure: 1) Transseptal Puncture 2) Unsuccessful left atrial appendage occlusion attempt (no device deployed because of difficult anatomy--anteriorly directed chicken wing LAA) 3) Perclose right femoral vein   Device Implant: No Device Implanted   Procedure description: US guidance is used for right femoral venous access. [place] images are stored digitally in the patient's chart. Double Preclose is performed at 10" and 2" positions using normal technique and a 16 Fr sheath is inserted. Transseptal puncture is then performed after fully anticoagulating the patient with unfractionated heparin. A Versacross Connect system is used with RF energy to cross the interatrial septum under fluoroscopic and TEE guidance. Please see Dr Antionette Char detailed report for further description of transseptal puncture and Watchman Access Sheath insertion.   Once the 14 Fr access sheath is in appropriate position in the left atrial appendage, a 31 mm Watchman Flex device is inserted and deployed to obtain appropriate position and compression in the left atrial appendage. Multiple recaptures were performed to try to position this device at the appendage ostium. Given  the steep anterior turn of the left atrial appendage, we could not achieve acceptable closure. The 6mm device was exchanged for a 56mm device which was deployed more distally within the appendage. Again, because of the anterior orientation of the appendage, a posterior shoulder and fabric leak were noted despite multiple repositioning attempts. The 62mm device was recaptured and the delivery sheath were removed from the LA. The access sheath was removed and the previously deployed perclose sutures are tightened for complete hemostasis.    EBL: minimal   Pt is transferred to the recovery area following the procedure with no early apparent complications.   Brief HPI: Hailey [name] is a 86 y.o. female with a history of PSVT, CAD s/p PCI DES of RCA 2018, HTN, HLD, CKD Stage II, dilated aortic aneurysm measuring 4.5cm on CT, Factor XI deficiency, known RBBB and permanent atrial fibrillation who is followed by Dr. Tamala Julian for her cardiology care. She was noted to have atrial fibrillation dating back to 2016. She was previously tolerating anticoagulation without complication however more recently has experienced frequent falls with chronic bleeding wounds. She was seen by Dr. Tamala Julian early 07/2021 and wished to be referred to Dr. Quentin Ore for possible Watchman implant.   She had recently undergone CT imaging 08/16/21 that showed normal pulmonary vein drainage into the left atrium, patent left atrial appendage, with anatomy suitable for Watchamn FLX implantation with known moderate thoracic aortic aneurysm performed 08/16/21.   Hospital Course:  She presented to [facility] 08/24/21 and unfortunately was unable to undergo LAAO closure with Watchman due to difficulty with appendage seal and patient anatomy. Team inserted and deployed a 37mm Watchman Flex device into the left atrial appendage, however multiple recaptures were  performed to try to position this device at the appendage ostium. Given the steep anterior turn of  the left atrial appendage, team could not achieve acceptable closure. The 14mm device was exchanged for a 21mm device which was deployed more distally within the appendage. Again, because of the anterior orientation of the appendage, a posterior shoulder and fabric leak were noted despite multiple repositioning attempts. The 39mm device was recaptured and the delivery sheath were removed from the LA. The access sheath was removed and the previously deployed perclose sutures are tightened for complete hemostasis.   Given the above, the case was appropriately aborted without implant. Plan is to monitor her for several hours in the short stay department with discharge later this evening. She will resume her home Eliquis 2.5mg  PO BID this evening prior to d/c.   Physical Exam: Vitals:   08/24/21 1510 08/24/21 1520 08/24/21 1530 08/24/21 1540  BP: 112/77   123/77  Pulse: 65 (!) 59 (!) 59 67  Resp:      Temp:      TempSrc:      SpO2: 91% (!) 87% 92% (!) 88%  Weight:      Height:       Labs:   Lab Results  Component Value Date   WBC 5.3 08/15/2021   HGB 10.0 (L) 08/15/2021   HCT 30.5 (L) 08/15/2021   MCV 82 08/15/2021   PLT 365 08/15/2021   No results for input(s): NA, K, CL, CO2, BUN, CREATININE, CALCIUM, PROT, BILITOT, ALKPHOS, ALT, AST, GLUCOSE in the last 168 hours.  Invalid input(s): LABALBU   Discharge Medications:  Allergies as of 08/24/2021       Reactions   Macrobid [nitrofurantoin Monohyd Macro] Other (See Comments)   Nausea, stomach cramps, fatigue , headache.   Meloxicam Other (See Comments)   Jittery and headache   Digoxin And Related    headaches   Keflex [cephalexin] Nausea And Vomiting   Nausea, fatigue, and headache.   Mobic [meloxicam] Other (See Comments)   Unsure of reaction type   Sulfamethoxazole-trimethoprim Nausea Only        Medication List     TAKE these medications    acetaminophen 500 MG tablet Commonly known as: TYLENOL Take 1,000 mg by  mouth every 6 (six) hours as needed (body aches / sore hip). Maximum of 6 tablets daily (3000mg )   Arnicare Gel Apply 1 application topically daily as needed (skin bruising).   atorvastatin 80 MG tablet Commonly known as: LIPITOR TAKE 1 TABLET DAILY AT 6PM.   BIOTIN PO Take 5,000 mcg by mouth in the morning.   Citrucel oral powder Generic drug: methylcellulose 2 tablespoons daily What changed:  how much to take how to take this when to take this additional instructions   diclofenac Sodium 1 % Gel Commonly known as: VOLTAREN Apply 2 g topically 4 (four) times daily as needed (back pain.).   dicyclomine 20 MG tablet Commonly known as: BENTYL Take 1 by mouth every 4-6 hours as needed for cramping   DULoxetine 60 MG capsule Commonly known as: CYMBALTA Take 1 capsule (60 mg total) by mouth daily. Must keep scheduled appt for future refills   Eliquis 2.5 MG Tabs tablet Generic drug: apixaban TAKE 1 TABLET BY MOUTH TWICE DAILY.   empagliflozin 10 MG Tabs tablet Commonly known as: Jardiance Take 1 tablet (10 mg total) by mouth daily before breakfast.   esomeprazole 40 MG capsule Commonly known as: NEXIUM TAKE ONE CAPSULE ONCE DAILY.  hyoscyamine 0.125 MG tablet Commonly known as: LEVSIN TAKE 1-2 TABLETS (0.125-0.25 MG) BY MOUTH EVERY 4 HOURS AS NEEDED FOR UP TO 10 DAYS FOR CRAMPING. What changed: See the new instructions.   IBgard 90 MG Cpcr Generic drug: Peppermint Oil Take 1 capsule before meals What changed:  how much to take how to take this when to take this additional instructions   LORazepam 1 MG tablet Commonly known as: ATIVAN TAKE ONE TABLET BY MOUTH TWICE DAILY AS NEEDED FOR ANXIETY / SLEEP. What changed: See the new instructions.   losartan 25 MG tablet Commonly known as: COZAAR TAKE 1 TABLET ONCE DAILY.   Lubricant Eye Drops 0.4-0.3 % Soln Generic drug: Polyethyl Glycol-Propyl Glycol Place 1-2 drops into both eyes 3 (three) times daily as  needed (dry/irritated eyes.).   metoprolol succinate 25 MG 24 hr tablet Commonly known as: TOPROL-XL Take 1 tablet (25 mg total) by mouth daily.   nitroGLYCERIN 0.4 MG SL tablet Commonly known as: NITROSTAT Place 0.4 mg under the tongue every 5 (five) minutes as needed for chest pain (Call 911 at 3rd dose within 15 minutes.).   ondansetron 4 MG disintegrating tablet Commonly known as: ZOFRAN-ODT DISSOLVE 1 TABLET IN MOUTH EVERY 8 HOURS AS NEEDED FOR NAUSEA OR VOMITING.   saccharomyces boulardii 250 MG capsule Commonly known as: Florastor Take 1 capsule (250 mg total) by mouth 2 (two) times daily. What changed: when to take this   sodium chloride 0.65 % Soln nasal spray Commonly known as: OCEAN Place 1 spray into both nostrils at bedtime.   spironolactone 25 MG tablet Commonly known as: ALDACTONE Take 0.5 tablets (12.5 mg total) by mouth daily. What changed: when to take this        Disposition:  Home Discharge Instructions     Call MD for:  difficulty breathing, headache or visual disturbances   Complete by: As directed    Call MD for:  extreme fatigue   Complete by: As directed    Call MD for:  hives   Complete by: As directed    Call MD for:  persistant dizziness or light-headedness   Complete by: As directed    Call MD for:  persistant nausea and vomiting   Complete by: As directed    Call MD for:  redness, tenderness, or signs of infection (pain, swelling, redness, odor or green/yellow discharge around incision site)   Complete by: As directed    Call MD for:  severe uncontrolled pain   Complete by: As directed    Call MD for:  temperature >100.4   Complete by: As directed    Diet - low sodium heart healthy   Complete by: As directed    Discharge instructions   Complete by: As directed    No driving for 2-3 days. No lifting over 5 lbs for 1 week. No sexual activity for 1 week. Keep procedure site clean & dry. If you notice increased pain, swelling, bleeding  or pus, call/return!  You may shower, but no soaking baths/hot tubs/pools for 1 week.   Increase activity slowly   Complete by: As directed        Follow-up Information     Tommie Raymond, NP Follow up on 10/02/2021.   Specialty: Cardiology Why: at 11am. Please arrive to your appointment at 10:45am. Contact information: [address] [place] [address] [phone]                Duration of  Discharge Encounter: Greater than 30 minutes including physician time.  Signed, Kathyrn Drown, NP  08/24/2021 3:41 PM

## 2021-08-24 NOTE — Op Note (Signed)
[facility]   Surgeon: Sherren Mocha, MD Co-Surgeon: Lars Mage, MD Imager: Sanda Klein, MD   Procedure: 1) Transseptal Puncture 2) Unsuccessful left atrial appendage occlusion attempt (no device deployed because of difficult anatomy--anteriorly directed chicken wing LAA) 3) Perclose right femoral vein   Device Implant: No Device Implanted   Background and Indication: 86 yo man with atrial fibrillation, CAD felt to be a poor candidate for long term anticoagulation. She is referred for Watchman implantation.    Procedure description: US guidance is used for right femoral venous access. [place] images are stored digitally in the patient's chart. Double Preclose is performed at 10" and 2" positions using normal technique and a 16 Fr sheath is inserted. Transseptal puncture is then performed after fully anticoagulating the patient with unfractionated heparin. A Versacross Connect system is used with RF energy to cross the interatrial septum under fluoroscopic and TEE guidance. Please see Dr Antionette Char detailed report for further description of transseptal puncture and Watchman Access Sheath insertion.   Once the 14 Fr access sheath is in appropriate position in the left atrial appendage, a 31 mm Watchman Flex device is inserted and deployed to obtain appropriate position and compression in the left atrial appendage. Multiple recaptures were performed to try to position this device at the appendage ostium. Given the steep anterior turn of the left atrial appendage, we could not achieve acceptable closure. The 51mm device was exchanged for a 86mm device which was deployed more distally within the appendage. Again, because of the anterior orientation of the appendage, a posterior shoulder and fabric leak were noted despite multiple repositioning attempts. The 67mm device was recaptured and the delivery sheath were removed from the LA. The access sheath was removed and the previously  deployed perclose sutures are tightened for complete hemostasis.    EBL: minimal   Pt is transferred to the recovery area following the procedure with no early apparent complications.    Lysbeth Galas T. Quentin Ore, MD, [facility], [facility] Cardiac Electrophysiology

## 2021-08-25 ENCOUNTER — Telehealth: Payer: Self-pay | Admitting: Cardiology

## 2021-08-25 ENCOUNTER — Encounter (HOSPITAL_COMMUNITY): Payer: Self-pay | Admitting: Cardiovascular Disease

## 2021-08-25 NOTE — Telephone Encounter (Signed)
° °  Spoke with patient after discharge yesterday afternoon. Watchman case was unfortunately aborted after poor device seal with attempt. Groin site stable with no bleeding or hematoma, however having mild tenderness with ambulation. Encouraged to use Tylenol for mild pain and call is symptoms worsen. Denies change in extremity sensation or color. No drainage, fevers, or chills. Will plan for follow up with myself 09/2021.   Kathyrn Drown NP-C Structural Heart Team  Pager: [phone]

## 2021-08-28 ENCOUNTER — Telehealth: Payer: Self-pay

## 2021-08-28 DIAGNOSIS — G8918 Other acute postprocedural pain: Secondary | ICD-10-CM

## 2021-08-28 NOTE — Telephone Encounter (Signed)
Received message below regarding patient:    Discussed with Nell Range, PA. Out of an abundance of caution, will order [place] of groin. The patient was grateful for call and agrees with plan.

## 2021-08-29 ENCOUNTER — Other Ambulatory Visit: Payer: Self-pay

## 2021-08-29 ENCOUNTER — Ambulatory Visit (HOSPITAL_COMMUNITY)
Admission: RE | Admit: 2021-08-29 | Discharge: 2021-08-29 | Disposition: A | Discharge status: Home / Self Care | Payer: Medicare Other | Source: Ambulatory Visit | Attending: Cardiovascular Disease | Admitting: Cardiovascular Disease

## 2021-08-29 ENCOUNTER — Ambulatory Visit: Payer: Medicare Other | Admitting: Internal Medicine

## 2021-08-29 DIAGNOSIS — R1031 Right lower quadrant pain: Secondary | ICD-10-CM | POA: Insufficient documentation

## 2021-08-29 DIAGNOSIS — M79604 Pain in right leg: Secondary | ICD-10-CM | POA: Diagnosis not present

## 2021-08-29 DIAGNOSIS — G8918 Other acute postprocedural pain: Secondary | ICD-10-CM | POA: Insufficient documentation

## 2021-09-01 ENCOUNTER — Telehealth: Payer: Self-pay | Admitting: Physical Medicine and Rehabilitation

## 2021-09-01 NOTE — Telephone Encounter (Signed)
Patient requesting a call back to schedule an appt for her back. She was referred by Dr. Sharol Given

## 2021-09-05 ENCOUNTER — Telehealth: Payer: Self-pay | Admitting: Orthopedic Surgery

## 2021-09-05 NOTE — Telephone Encounter (Signed)
Pt called stating Dr.Duda wanted her to see Dr.Newton for her back pain but didn't put a referral in. Pt would like to have the referral put in and a CB to update her when that's been done so she can schedule her appt.   [phone]

## 2021-09-05 NOTE — Telephone Encounter (Signed)
I do not see any comments on previous OV notes about Dr. Ernestina Patches referral. Please advise.

## 2021-09-07 NOTE — Telephone Encounter (Signed)
I sw pt, she will come in 09/14/21.

## 2021-09-07 NOTE — Telephone Encounter (Signed)
Per Dr. Sharol Given he would like to see her first before making a referral to Dr. Ernestina Patches, not sure if this is the source of her pain.

## 2021-09-14 ENCOUNTER — Other Ambulatory Visit: Payer: Self-pay

## 2021-09-14 ENCOUNTER — Ambulatory Visit: Payer: Medicare Other | Admitting: Orthopedic Surgery

## 2021-09-14 ENCOUNTER — Ambulatory Visit (INDEPENDENT_AMBULATORY_CARE_PROVIDER_SITE_OTHER): Payer: Medicare Other

## 2021-09-14 DIAGNOSIS — G5603 Carpal tunnel syndrome, bilateral upper limbs: Secondary | ICD-10-CM

## 2021-09-14 DIAGNOSIS — G8929 Other chronic pain: Secondary | ICD-10-CM

## 2021-09-14 DIAGNOSIS — M5441 Lumbago with sciatica, right side: Secondary | ICD-10-CM

## 2021-09-15 ENCOUNTER — Encounter: Payer: Self-pay | Admitting: Orthopedic Surgery

## 2021-09-15 NOTE — Progress Notes (Signed)
Office Visit Note   Patient: Hailey [name]           Date of Birth: 09/25/1934           MRN: [id number] Visit Date: 09/14/2021              Requested by: Cassandria Anger, MD [place],  [place] [address] PCP: Plotnikov, Evie Lacks, MD  Chief Complaint  Patient presents with   Lower Back - Pain    Discuss referral to Dr. Ernestina Patches      HPI: Patient is an 87 year old woman who presents for 3 separate issues.  #1 she states she has been having some numbness in the index finger of both hands and has developed weakness with grip strength.  Patient is status post treatment for wound right tibial tubercle which she states is healed well.  #3 patient complains of chronic lower back pain present with standing or sitting and goes all the way down the right leg.  She states she occasionally has numbness in the great toe bilaterally.  Patient did undergo vascular intervention for implantation of a watchman device.  Patient states that they were unable to implant the device.  Assessment & Plan: Visit Diagnoses:  1. Chronic right-sided low back pain with right-sided sciatica   2. Bilateral carpal tunnel syndrome     Plan: Patient will follow-up with Dr. Ernestina Patches for evaluation for epidural steroid injection.  Patient will also need nerve conduction studies with Dr. Ernestina Patches.  Follow-Up Instructions: Return in about 1 week (around 09/21/2021) for Follow-up with Dr. Ernestina Patches for evaluation for epidural steroid injection.   Ortho Exam  Patient is alert, oriented, no adenopathy, well-dressed, normal affect, normal respiratory effort. Examination patient has some pain to palpation over the carpal tunnel and with flexion.  She has decreased grip strength in both hands.  She has pain across the lower back and down the right leg.  She has a negative straight leg raise she has no focal motor weakness in either lower extremity.  Examination the right knee the wound has completely  healed.  Imaging: No results found. No images are attached to the encounter.  Labs: Lab Results  Component Value Date   HGBA1C 5.6 07/04/2014   ESRSEDRATE 31 (H) 08/30/2014   ESRSEDRATE 29 (H) 12/18/2010   ESRSEDRATE 39 (H) 06/14/2009   CRP <1.0 08/17/2019   CRP 0.7 08/30/2014   LABURIC 6.1 06/14/2009   REPTSTATUS 09/01/2020 FINAL 08/27/2020   REPTSTATUS 09/01/2020 FINAL 08/27/2020   CULT  08/27/2020    NO GROWTH 5 DAYS Performed at [facility], [place] [address] [place], [place] [address]    CULT  08/27/2020    NO GROWTH 5 DAYS Performed at [facility] [address] [place], [place] [address]    LABORGA ESCHERICHIA COLI 02/19/2017     Lab Results  Component Value Date   ALBUMIN 3.9 06/21/2021   ALBUMIN 2.8 (L) 08/30/2020   ALBUMIN 2.7 (L) 08/29/2020    No results found for: MG Lab Results  Component Value Date   VD25OH 32 12/12/2016    No results found for: PREALBUMIN CBC EXTENDED Latest Ref Rng & Units 08/15/2021 07/05/2021 06/21/2021  WBC 3.4 - 10.8 x10E3/uL 5.3 6.3 7.4  RBC 3.77 - 5.28 x10E6/uL 3.70(L) 3.65(L) 3.81(L)  HGB 11.1 - 15.9 g/dL 10.0(L) 10.1(L) 10.5(L)  HCT 34.0 - 46.6 % 30.5(L) 31.9(L) 33.4(L)  PLT 150 - 450 x10E3/uL 365 302 308.0  NEUTROABS 1.4 - 7.0 x10E3/uL 3.0 - 4.9  LYMPHSABS 0.7 - 3.1 x10E3/uL 1.3 - 1.5     There is no height or weight on file to calculate BMI.  Orders:  Orders Placed This Encounter  Procedures   XR Lumbar Spine 2-3 Views   Ambulatory referral to Physical Medicine Rehab   No orders of the defined types were placed in this encounter.    Procedures: No procedures performed  Clinical Data: No additional findings.  ROS:  All other systems negative, except as noted in the HPI. Review of Systems  Objective: Vital Signs: There were no vitals taken for this visit.  Specialty Comments:  No specialty comments available.  PMFS History: Patient Active Problem List   Diagnosis Date Noted    Shortness of breath 06/12/2021   Right leg pain 02/21/2021   Diverticulitis 01/08/2021   Acute respiratory failure with hypoxia (HCC)    Bacterial colitis    Abnormal CT of the abdomen    Diarrhea of infectious origin    Acute colitis 08/27/2020   Aortic atherosclerosis ([place]) 08/27/2020   Acute prerenal azotemia 08/27/2020   Shock circulatory ([place]) 96/29/5284   Metabolic acidosis with normal anion gap and bicarbonate losses 08/27/2020   Dehydration    Squamous cell cancer of skin of left cheek 13/24/4010   Lichen sclerosus 27/25/3664   Abdominal cramping 03/16/2020   Neurogenic orthostatic hypotension ([place]) 09/16/2019   Injury of face 08/31/2019   Leg abrasion, infected, right, subsequent encounter 08/28/2019   Face lacerations 08/26/2019   Nose fracture 08/26/2019   Concussion 08/26/2019   Wrist pain, acute, right 08/26/2019   Greater trochanteric pain syndrome of right lower extremity 07/30/2019   Viral URI with cough 08/15/2018   Abdominal pain 05/22/2018   Traumatic hematoma of right knee 01/10/2018   Ulcer of right knee (HCC)    Bradycardia with 41-50 beats per minute 12/27/2017   Right bundle branch block (RBBB) on electrocardiogram (ECG) 12/27/2017   Quadriceps tendon rupture 12/24/2017   CKD (chronic kidney disease), stage II 12/24/2017   Cellulitis of leg, right with large prepatella hematoma and open wounds 12/24/2017   Iliotibial band syndrome of right side 12/16/2017   Depression 07/11/2017   Intervertebral lumbar disc disorder with myelopathy, lumbar region 04/25/2017   Family history of colon cancer in mother 01/22/2017   Primary osteoarthritis of both first carpometacarpal joints 01/02/2017   Pain 11/21/2016   Gastroenteritis 09/13/2016   Chronic anticoagulation 05/24/2015   Coronary artery disease involving native coronary artery of native heart with angina pectoris ([place]) 12/20/2014   Iliotibial band syndrome of left side 11/16/2014   Diverticulitis of colon  08/30/2014   Chest pain, atypical 07/03/2014   Diarrhea 12/22/2013   Permanent atrial fibrillation ([place]) 11/21/2012   Greater trochanteric bursitis of both hips 05/21/2012   Dizzinesses 11/07/2011   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   Osteoarthritis of left hip 07/03/2010   DEGENERATIVE DISC DISEASE, LUMBOSACRAL SPINE 05/18/2010   SYNCOPE 10/27/2008   Essential hypertension 06/16/2007   GERD (gastroesophageal reflux disease) 06/16/2007   COLONIC POLYPS, HX OF 06/16/2007   Past Medical History:  Diagnosis Date   Acute blood loss anemia 12/25/2017   Allergic rhinitis    Anxiety    Arthritis    "my whole spine" (07/01/2017)   Atrial fibrillation (HCC)    Bowel obstruction ([place])    in [place]   Bradycardia with 41-50 beats per minute 12/27/2017   Cancer (HCC)    Cellulitis of  leg, right with large prepatella hematoma and open wounds 12/26/2017   CKD (chronic kidney disease) stage 2, GFR 60-89 ml/min    Colon polyps    Coronary artery disease    10/18 PCI/DES to p/m LCx with cutting balloon to mLcx   Diverticulosis of colon    GERD (gastroesophageal reflux disease)    Hip bursitis 2010   Dr Para March, Post op seroma   History of colon polyps    HSV (herpes simplex virus) anogenital infection 07/2019   HTN (hypertension)    IBS (irritable bowel syndrome)    constipation predominant - Dr Earlean Shawl   Lichen sclerosus    Osteopenia 11/2016   T score -2.0 FRAX 15%/4.3%   PAC (premature atrial contraction)    Symptomatiic   Renal insufficiency    Right bundle branch block (RBBB) on electrocardiogram (ECG) 12/27/2017   Scoliosis    SVT (supraventricular tachycardia) (HCC)    brief history   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   biopsy showing vulvar atypia, possible VIN I    Family History  Problem Relation Age of Onset   Colon cancer Mother        Dx age 36, died at age 34   Diabetes Father    Prostate cancer Father    Prostate cancer Brother     Pancreatic cancer Brother    Stomach cancer Son    Heart attack Neg Hx    Stroke Neg Hx    Esophageal cancer Neg Hx     Past Surgical History:  Procedure Laterality Date   ANTERIOR AND POSTERIOR VAGINAL REPAIR  01/2002   Archie Endo 01/23/2011   APPENDECTOMY  1948   CARDIAC CATHETERIZATION  06/26/2017   CORONARY ANGIOPLASTY WITH STENT PLACEMENT  07/01/2017   CORONARY ATHERECTOMY N/A 07/01/2017   Procedure: CORONARY ATHERECTOMY;  Surgeon: Belva Crome, MD;  Location: [facility];  Service: Cardiovascular;  Laterality: N/A;   CORONARY STENT INTERVENTION N/A 07/01/2017   Procedure: CORONARY STENT INTERVENTION;  Surgeon: Belva Crome, MD;  Location: [facility];  Service: Cardiovascular;  Laterality: N/A;   HAMMER TOE SURGERY     HEMORRHOID BANDING     HIP SURGERY Left 04/2009   hip examination under anesthesia followed by greater trochanteric bursectomy; iliotibial band tenotomy/notes 01/20/2011   I & D EXTREMITY Right 01/10/2018   Procedure: IRRIGATION AND DEBRIDEMENT RIGHT KNEE, APPLY WOUND VAC;  Surgeon: Newt Minion, MD;  Location: [facility];  Service: Orthopedics;  Laterality: Right;   KNEE BURSECTOMY Right 04/2009   Archie Endo 01/09/2011   LEFT ATRIAL APPENDAGE OCCLUSION N/A 08/24/2021   Procedure: LEFT ATRIAL APPENDAGE OCCLUSION;  Surgeon: Sherren Mocha, MD;  Location: [facility];  Service: Cardiovascular;  Laterality: N/A;   LEFT HEART CATH AND CORONARY ANGIOGRAPHY N/A 06/26/2017   Procedure: LEFT HEART CATH AND CORONARY ANGIOGRAPHY;  Surgeon: Belva Crome, MD;  Location: [facility];  Service: Cardiovascular;  Laterality: N/A;   PUBOVAGINAL SLING  01/2002   Archie Endo 01/23/2011   REDUCTION MAMMAPLASTY     TEE WITHOUT CARDIOVERSION N/A 08/24/2021   Procedure: TRANSESOPHAGEAL ECHOCARDIOGRAM (TEE);  Surgeon: Sherren Mocha, MD;  Location: [facility];  Service: Cardiovascular;  Laterality: N/A;   TEMPORARY PACEMAKER N/A 07/01/2017   Procedure: TEMPORARY  PACEMAKER;  Surgeon: Belva Crome, MD;  Location: [facility];  Service: Cardiovascular;  Laterality: N/A;   VAGINAL HYSTERECTOMY  01/2002   Vaginal hysterectomy, bilateral salpingo-oophorectomy/notes 01/23/2011  Social History   Occupational History   Occupation: Patent attorney: RETIRED  Tobacco Use   Smoking status: Former    Packs/day: 0.25    Years: 28.00    Pack years: 7.00    Types: Cigarettes    Quit date: 1981    Years since quitting: 42.0   Smokeless tobacco: Never  Vaping Use   Vaping Use: Never used  Substance and Sexual Activity   Alcohol use: Yes    Comment: 7 vodka drinks a week   Drug use: Never   Sexual activity: Not Currently    Birth control/protection: Surgical    Comment: hysterectomy

## 2021-09-18 ENCOUNTER — Ambulatory Visit: Payer: Medicare Other | Admitting: Orthopedic Surgery

## 2021-09-20 DIAGNOSIS — Z86007 Personal history of in-situ neoplasm of skin: Secondary | ICD-10-CM | POA: Diagnosis not present

## 2021-09-20 DIAGNOSIS — Z23 Encounter for immunization: Secondary | ICD-10-CM | POA: Diagnosis not present

## 2021-09-20 DIAGNOSIS — L814 Other melanin hyperpigmentation: Secondary | ICD-10-CM | POA: Diagnosis not present

## 2021-09-20 DIAGNOSIS — L821 Other seborrheic keratosis: Secondary | ICD-10-CM | POA: Diagnosis not present

## 2021-09-20 DIAGNOSIS — L578 Other skin changes due to chronic exposure to nonionizing radiation: Secondary | ICD-10-CM | POA: Diagnosis not present

## 2021-09-20 DIAGNOSIS — D1801 Hemangioma of skin and subcutaneous tissue: Secondary | ICD-10-CM | POA: Diagnosis not present

## 2021-09-20 DIAGNOSIS — L570 Actinic keratosis: Secondary | ICD-10-CM | POA: Diagnosis not present

## 2021-09-26 ENCOUNTER — Telehealth (INDEPENDENT_AMBULATORY_CARE_PROVIDER_SITE_OTHER): Payer: Medicare Other | Admitting: Family Medicine

## 2021-09-26 DIAGNOSIS — U071 COVID-19: Secondary | ICD-10-CM

## 2021-09-26 MED ORDER — BENZONATATE 100 MG PO CAPS: ORAL_CAPSULE | ORAL | 0 refills | Status: DC

## 2021-09-26 MED ORDER — MOLNUPIRAVIR EUA 200MG CAPSULE: 4.0000 | ORAL_CAPSULE | Freq: Two times a day (BID) | ORAL | 0 refills | Status: AC

## 2021-09-26 NOTE — Progress Notes (Signed)
Virtual Visit via Video Note  I connected with Hailey [name]  on 09/26/21 at  4:00 PM EST by a video enabled telemedicine application and verified that I am speaking with the correct person using two identifiers.  Location patient: [place] Location provider:work or home office Persons participating in the virtual visit: patient, provider  I discussed the limitations and requested verbal permission for telemedicine visit. The patient expressed understanding and agreed to proceed.   HPI:  Acute telemedicine visit for Covid19: -Onset:3 days ago; tested positive for covid -Symptoms include: cough, congestion -Denies:fevers, vomiting, CP, SOB -able to drink fluids and get around the house -Pertinent past medical history: see below -Pertinent medication allergies: Allergies  Allergen Reactions   Macrobid [Nitrofurantoin Monohyd Macro] Other (See Comments)    Nausea, stomach cramps, fatigue , headache.   Meloxicam Other (See Comments)    Jittery and headache   Digoxin And Related     headaches   Keflex [Cephalexin] Nausea And Vomiting    Nausea, fatigue, and headache.   Mobic [Meloxicam] Other (See Comments)    Unsure of reaction type   Sulfamethoxazole-Trimethoprim Nausea Only  -COVID-19 vaccine status: vaccinated x 2 and 2 boosters Immunization History  Administered Date(s) Administered   Influenza Split 06/18/2012   Influenza Whole 05/26/2008, 06/05/2010   Influenza, High Dose Seasonal PF 06/03/2014, 06/12/2017, 05/22/2018, 05/14/2019, 06/07/2020   Influenza,inj,Quad PF,6+ Mos 06/09/2015   Influenza-Unspecified 06/08/2013   PFIZER(Purple Top)SARS-COV-2 Vaccination 09/24/2019, 10/15/2019, 06/07/2020   Pneumococcal Conjugate-13 01/25/2014   Pneumococcal Polysaccharide-23 11/07/2018   Td 06/11/2007, 06/17/2007   Tdap 12/22/2017   Zoster, Live 12/01/2008     ROS: See pertinent positives and negatives per HPI.  Past Medical History:  Diagnosis Date   Acute blood loss anemia 12/25/2017    Allergic rhinitis    Anxiety    Arthritis    "my whole spine" (07/01/2017)   Atrial fibrillation (HCC)    Bowel obstruction (HCC)    in [place]   Bradycardia with 41-50 beats per minute 12/27/2017   Cancer ([place])    Cellulitis of leg, right with large prepatella hematoma and open wounds 12/26/2017   CKD (chronic kidney disease) stage 2, GFR 60-89 ml/min    Colon polyps    Coronary artery disease    10/18 PCI/DES to p/m LCx with cutting balloon to mLcx   Diverticulosis of colon    GERD (gastroesophageal reflux disease)    Hip bursitis 2010   Dr Para March, Post op seroma   History of colon polyps    HSV (herpes simplex virus) anogenital infection 07/2019   HTN (hypertension)    IBS (irritable bowel syndrome)    constipation predominant - Dr Earlean Shawl   Lichen sclerosus    Osteopenia 11/2016   T score -2.0 FRAX 15%/4.3%   PAC (premature atrial contraction)    Symptomatiic   Renal insufficiency    Right bundle branch block (RBBB) on electrocardiogram (ECG) 12/27/2017   Scoliosis    SVT (supraventricular tachycardia) ([place])    brief history   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   biopsy showing vulvar atypia, possible VIN I    Past Surgical History:  Procedure Laterality Date   ANTERIOR AND POSTERIOR VAGINAL REPAIR  01/2002   Archie Endo 01/23/2011   APPENDECTOMY  1948   CARDIAC CATHETERIZATION  06/26/2017   CORONARY ANGIOPLASTY WITH STENT PLACEMENT  07/01/2017   CORONARY ATHERECTOMY N/A 07/01/2017   Procedure: CORONARY ATHERECTOMY;  Surgeon: Belva Crome, MD;  Location: [facility];  Service: Cardiovascular;  Laterality: N/A;   CORONARY STENT INTERVENTION N/A 07/01/2017   Procedure: CORONARY STENT INTERVENTION;  Surgeon: Belva Crome, MD;  Location: [facility];  Service: Cardiovascular;  Laterality: N/A;   HAMMER TOE SURGERY     HEMORRHOID BANDING     HIP SURGERY Left 04/2009   hip examination under anesthesia followed by greater trochanteric  bursectomy; iliotibial band tenotomy/notes 01/20/2011   I & D EXTREMITY Right 01/10/2018   Procedure: IRRIGATION AND DEBRIDEMENT RIGHT KNEE, APPLY WOUND VAC;  Surgeon: Newt Minion, MD;  Location: [facility];  Service: Orthopedics;  Laterality: Right;   KNEE BURSECTOMY Right 04/2009   Archie Endo 01/09/2011   LEFT ATRIAL APPENDAGE OCCLUSION N/A 08/24/2021   Procedure: LEFT ATRIAL APPENDAGE OCCLUSION;  Surgeon: Sherren Mocha, MD;  Location: [facility];  Service: Cardiovascular;  Laterality: N/A;   LEFT HEART CATH AND CORONARY ANGIOGRAPHY N/A 06/26/2017   Procedure: LEFT HEART CATH AND CORONARY ANGIOGRAPHY;  Surgeon: Belva Crome, MD;  Location: [facility];  Service: Cardiovascular;  Laterality: N/A;   PUBOVAGINAL SLING  01/2002   Archie Endo 01/23/2011   REDUCTION MAMMAPLASTY     TEE WITHOUT CARDIOVERSION N/A 08/24/2021   Procedure: TRANSESOPHAGEAL ECHOCARDIOGRAM (TEE);  Surgeon: Sherren Mocha, MD;  Location: [facility];  Service: Cardiovascular;  Laterality: N/A;   TEMPORARY PACEMAKER N/A 07/01/2017   Procedure: TEMPORARY PACEMAKER;  Surgeon: Belva Crome, MD;  Location: [facility];  Service: Cardiovascular;  Laterality: N/A;   VAGINAL HYSTERECTOMY  01/2002   Vaginal hysterectomy, bilateral salpingo-oophorectomy/notes 01/23/2011     Current Outpatient Medications:    benzonatate (TESSALON PERLES) 100 MG capsule, 1-2 capsules up to twice daily as needed for cough, Disp: 30 capsule, Rfl: 0   molnupiravir EUA (LAGEVRIO) 200 mg CAPS capsule, Take 4 capsules (800 mg total) by mouth 2 (two) times daily for 5 days., Disp: 40 capsule, Rfl: 0   acetaminophen (TYLENOL) 500 MG tablet, Take 1,000 mg by mouth every 6 (six) hours as needed (body aches / sore hip). Maximum of 6 tablets daily (3000mg ), Disp: , Rfl:    atorvastatin (LIPITOR) 80 MG tablet, TAKE 1 TABLET DAILY AT 6PM. (Patient not taking: Reported on 08/17/2021), Disp: 90 tablet, Rfl: 0   BIOTIN PO, Take 5,000 mcg by mouth  in the morning., Disp: , Rfl:    diclofenac Sodium (VOLTAREN) 1 % GEL, Apply 2 g topically 4 (four) times daily as needed (back pain.)., Disp: , Rfl:    dicyclomine (BENTYL) 20 MG tablet, Take 1 by mouth every 4-6 hours as needed for cramping, Disp: 30 tablet, Rfl: 3   DULoxetine (CYMBALTA) 60 MG capsule, Take 1 capsule (60 mg total) by mouth daily. Must keep scheduled appt for future refills, Disp: 90 capsule, Rfl: 0   ELIQUIS 2.5 MG TABS tablet, TAKE 1 TABLET BY MOUTH TWICE DAILY., Disp: 180 tablet, Rfl: 1   empagliflozin (JARDIANCE) 10 MG TABS tablet, Take 1 tablet (10 mg total) by mouth daily before breakfast., Disp: 90 tablet, Rfl: 3   esomeprazole (NEXIUM) 40 MG capsule, TAKE ONE CAPSULE ONCE DAILY., Disp: 90 capsule, Rfl: 1   Homeopathic Products (ARNICARE) GEL, Apply 1 application topically daily as needed (skin bruising)., Disp: , Rfl:    hyoscyamine (LEVSIN) 0.125 MG tablet, TAKE 1-2 TABLETS (0.125-0.25 MG) BY MOUTH EVERY 4 HOURS AS NEEDED FOR UP TO 10 DAYS FOR CRAMPING. (Patient taking differently: Take 0.125-0.25 mg by mouth every 4 (four) hours as  needed for cramping.), Disp: 100 tablet, Rfl: 1   LORazepam (ATIVAN) 1 MG tablet, TAKE ONE TABLET BY MOUTH TWICE DAILY AS NEEDED FOR ANXIETY / SLEEP. (Patient taking differently: Take 1-1.5 mg by mouth at bedtime.), Disp: 180 tablet, Rfl: 1   losartan (COZAAR) 25 MG tablet, TAKE 1 TABLET ONCE DAILY., Disp: 90 tablet, Rfl: 3   methylcellulose (CITRUCEL) oral powder, 2 tablespoons daily (Patient taking differently: Take 1 packet by mouth 3 (three) times a week.), Disp: , Rfl:    metoprolol succinate (TOPROL-XL) 25 MG 24 hr tablet, Take 1 tablet (25 mg total) by mouth daily., Disp: 90 tablet, Rfl: 3   nitroGLYCERIN (NITROSTAT) 0.4 MG SL tablet, Place 0.4 mg under the tongue every 5 (five) minutes as needed for chest pain (Call 911 at 3rd dose within 15 minutes.)., Disp: , Rfl:    ondansetron (ZOFRAN-ODT) 4 MG disintegrating tablet, DISSOLVE 1 TABLET  IN MOUTH EVERY 8 HOURS AS NEEDED FOR NAUSEA OR VOMITING., Disp: 30 tablet, Rfl: 3   Peppermint Oil (IBGARD) 90 MG CPCR, Take 1 capsule before meals (Patient taking differently: Take 1 capsule by mouth 2 (two) times daily before a meal.), Disp: , Rfl:    Polyethyl Glycol-Propyl Glycol (LUBRICANT EYE DROPS) 0.4-0.3 % SOLN, Place 1-2 drops into both eyes 3 (three) times daily as needed (dry/irritated eyes.)., Disp: , Rfl:    saccharomyces boulardii (FLORASTOR) 250 MG capsule, Take 1 capsule (250 mg total) by mouth 2 (two) times daily. (Patient taking differently: Take 250 mg by mouth daily.), Disp: 60 capsule, Rfl: 2   sodium chloride (OCEAN) 0.65 % SOLN nasal spray, Place 1 spray into both nostrils at bedtime., Disp: , Rfl:    spironolactone (ALDACTONE) 25 MG tablet, Take 0.5 tablets (12.5 mg total) by mouth daily. (Patient taking differently: Take 12.5 mg by mouth every Monday, Wednesday, and Friday.), Disp: 45 tablet, Rfl: 3  EXAM:  VITALS per patient if applicable:  GENERAL: alert, oriented, appears well and in no acute distress  HEENT: atraumatic, conjunttiva clear, no obvious abnormalities on inspection of external nose and ears  NECK: normal movements of the head and neck  LUNGS: on inspection no signs of respiratory distress, breathing rate appears normal, no obvious gross SOB, gasping or wheezing  CV: no obvious cyanosis  MS: moves all visible extremities without noticeable abnormality  PSYCH/NEURO: pleasant and cooperative, no obvious depression or anxiety, speech and thought processing grossly intact  ASSESSMENT AND PLAN:  Discussed the following assessment and plan:  COVID-19   Discussed treatment options and risk of drug interactions, ideal treatment window, potential complications, isolation and precautions for COVID-19.  Discussed possibility of rebound with antivirals and the need to reisolate if it should occur for 5 days. Checked for/reviewed any labs done in the last 90  days with GFR listed in HPI if available. After lengthy discussion, the patient opted for treatment with molnupiravir due to being higher risk for complications of covid or severe disease and other factors. Discussed EUA status of this drug and the fact that there is preliminary limited knowledge of risks/interactions/side effects per EUA document vs possible benefits and precautions. This information was shared with patient during the visit and also was provided in patient instructions. Also, advised that patient discuss risks/interactions and use with pharmacist/treatment team as well. The patient did want a prescription for cough, Tessalon Rx sent.  Other symptomatic care measures summarized in patient instructions. Advised to seek prompt virtual visit or in person care if worsening, new  symptoms arise, or if is not improving with treatment as expected per our conversation of expected course. Discussed options for follow up care. Did let this patient know that I do telemedicine on Tuesdays and Thursdays for  and those are the days I am logged into the system. Advised to schedule follow up visit with PCP, [facility] virtual visits or [facility] if any further questions or concerns to avoid delays in care.   I discussed the assessment and treatment plan with the patient. The patient was provided an opportunity to ask questions and all were answered. The patient agreed with the plan and demonstrated an understanding of the instructions.     Lucretia Kern, DO

## 2021-09-26 NOTE — Patient Instructions (Signed)
HOME CARE TIPS:  -COVID19 testing information: [email or web address]  Most pharmacies also offer testing and home test kits. If the Covid19 test is positive and you desire antiviral treatment, please contact a [facility] or schedule a follow up virtual visit through your primary care office or through the [facility].  Other test to treat options: [email or web address]  -I sent the medication(s) we discussed to your pharmacy: Meds ordered this encounter  Medications   molnupiravir EUA (LAGEVRIO) 200 mg CAPS capsule    Sig: Take 4 capsules (800 mg total) by mouth 2 (two) times daily for 5 days.    Dispense:  40 capsule    Refill:  0   benzonatate (TESSALON PERLES) 100 MG capsule    Sig: 1-2 capsules up to twice daily as needed for cough    Dispense:  30 capsule    Refill:  0     -I sent in the [place] treatment or referral you requested per our discussion. Please see the information provided below and discuss further with the pharmacist/treatment team.   -there is a chance of rebound illness after finishing your treatment. If you become sick again please isolate for an additional 5 days, plus 5 more days of masking.   -can use tylenol if needed for fevers, aches and pains per instructions  -can use nasal saline a few times per day if you have nasal congestion   -stay hydrated, drink plenty of fluids and eat small healthy meals - avoid dairy  -follow up with your doctor in 2-3 days unless improving and feeling better  -stay home while sick, except to seek medical care. If you have COVID19, you will likely be contagious for 7-10 days. Flu or Influenza is likely contagious for about 7 days. Other respiratory viral infections remain contagious for 5-10+ days depending on the virus and many other factors. Wear a good mask that fits snugly (such as N95 or KN95) if around others to reduce the risk of transmission.  It  was nice to meet you today, and I really hope you are feeling better soon. I help [facility] out with telemedicine visits on Tuesdays and Thursdays and am happy to help if you need a follow up virtual visit on those days. Otherwise, if you have any concerns or questions following this visit please schedule a follow up visit with your Primary Care doctor or seek care at a local urgent care clinic to avoid delays in care.    Seek in person care or schedule a follow up video visit promptly if your symptoms worsen, new concerns arise or you are not improving with treatment. Call 911 and/or seek emergency care if your symptoms are severe or life threatening.    Fact Sheet for Patients And Caregivers Emergency Use Authorization (EUA) Of LAGEVRIO (molnupiravir) capsules For Coronavirus Disease 2019 (COVID-19)  What is the most important information I should know about LAGEVRIO? LAGEVRIO may cause serious side effects, including: ? LAGEVRIO may cause harm to your unborn baby. It is not known if LAGEVRIO will harm your baby if you take LAGEVRIO during pregnancy. o LAGEVRIO is not recommended for use in pregnancy. o LAGEVRIO has not been studied in pregnancy. LAGEVRIO was studied in pregnant animals only. When LAGEVRIO was given to pregnant animals, LAGEVRIO caused harm to their unborn babies. o You and your healthcare provider may decide that you should take LAGEVRIO during pregnancy if there are no other COVID-19 treatment options approved or authorized by the FDA that are accessible  or clinically appropriate for you. o If you and your healthcare provider decide that you should take LAGEVRIO during pregnancy, you and your healthcare provider should discuss the known and potential benefits and the potential risks of taking LAGEVRIO during pregnancy. For individuals who are able to become pregnant: ? You should use a reliable method of birth control (contraception) consistently and correctly during  treatment with LAGEVRIO and for 4 days after the last dose of LAGEVRIO. Talk to your healthcare provider about reliable birth control methods. ? Before starting treatment with [facility] your healthcare provider may do a pregnancy test to see if you are pregnant before starting treatment with LAGEVRIO. ? Tell your healthcare provider right away if you become pregnant or think you may be pregnant during treatment with LAGEVRIO. Pregnancy Surveillance Program: ? There is a pregnancy surveillance program for individuals who take LAGEVRIO during pregnancy. The purpose of this program is to collect information about the health of you and your baby. Talk to your healthcare provider about how to take part in this program. ? If you take LAGEVRIO during pregnancy and you agree to participate in the pregnancy surveillance program and allow your healthcare provider to share your information with [place], then your healthcare provider will report your use of Cool during pregnancy to [place]. by calling [phone] or [email or web address]. For individuals who are sexually active with partners who are able to become pregnant: ? It is not known if LAGEVRIO can affect sperm. While the risk is regarded as low, animal studies to fully assess the potential for LAGEVRIO to affect the babies of males treated with LAGEVRIO have not been completed. A reliable method of birth control (contraception) should be used consistently and correctly during treatment with LAGEVRIO and for at least 3 months after the last dose. The risk to sperm beyond 3 months is not known. Studies to understand the risk to sperm beyond 3 months are ongoing. Talk to your healthcare provider about reliable birth control methods. Talk to your healthcare provider if you have questions or concerns about how LAGEVRIO may affect sperm. You are being given this fact sheet because your healthcare provider  believes it is necessary to provide you with LAGEVRIO for the treatment of adults with mild-to-moderate coronavirus disease 2019 (COVID-19) with positive results of direct SARS-CoV-2 viral testing, and who are at high risk for progression to severe COVID-19 including hospitalization or death, and for whom other COVID-19 treatment options approved or authorized by the FDA are not accessible or clinically appropriate. The U.S. Food and Drug Administration (FDA) has issued an Emergency Use Authorization (EUA) to make LAGEVRIO available during the COVID-19 pandemic (for more details about an EUA please see What is an Emergency Use Authorization? at the end of this document). LAGEVRIO is not an FDA-approved medicine in the [place]. Read this Fact Sheet for information about LAGEVRIO. Talk to your healthcare provider about your options if you have any questions. It is your choice to take LAGEVRIO.  What is COVID-19? COVID-19 is caused by a virus called a coronavirus. You can get COVID-19 through close contact with another person who has the virus. COVID-19 illnesses have ranged from very mild-to-severe, including illness resulting in death. While information so far suggests that most COVID-19 illness is mild, serious illness can happen and may cause some of your other medical conditions to become worse. Older people and people of all ages with severe, long lasting (chronic) medical conditions like heart  disease, lung disease and diabetes, for example seem to be at higher risk of being hospitalized for COVID-19.  What is LAGEVRIO? LAGEVRIO is an investigational medicine used to treat mild-to-moderate COVID-19 in adults: ? with positive results of direct SARS-CoV-2 viral testing, and ? who are at high risk for progression to severe COVID-19 including hospitalization or death, and for whom other COVID-19 treatment options approved or authorized by the FDA are not accessible or clinically  appropriate. The FDA has authorized the emergency use of LAGEVRIO for the treatment of mild-tomoderate COVID-19 in adults under an EUA. For more information on EUA, see the What is an Emergency Use Authorization (EUA)? section at the end of this Fact Sheet. LAGEVRIO is not authorized: ? for use in people less than 65 years of age. ? for prevention of COVID-19. ? for people needing hospitalization for COVID-19. ? for use for longer than 5 consecutive days.  What should I tell my healthcare provider before I take LAGEVRIO? Tell your healthcare provider if you: ? Have any allergies ? Are breastfeeding or plan to breastfeed ? Have any serious illnesses ? Are taking any medicines (prescription, over-the-counter, vitamins, or herbal products).  How do I take LAGEVRIO? ? Take LAGEVRIO exactly as your healthcare provider tells you to take it. ? Take 4 capsules of LAGEVRIO every 12 hours (for example, at 8 am and at 8 pm) ? Take LAGEVRIO for 5 days. It is important that you complete the full 5 days of treatment with LAGEVRIO. Do not stop taking LAGEVRIO before you complete the full 5 days of treatment, even if you feel better. ? Take LAGEVRIO with or without food. ? You should stay in isolation for as long as your healthcare provider tells you to. Talk to your healthcare provider if you are not sure about how to properly isolate while you have COVID-19. ? Swallow LAGEVRIO capsules whole. Do not open, break, or crush the capsules. If you cannot swallow capsules whole, tell your healthcare provider. ? What to do if you miss a dose: o If it has been less than 10 hours since the missed dose, take it as soon as you remember o If it has been more than 10 hours since the missed dose, skip the missed dose and take your dose at the next scheduled time. ? Do not double the dose of LAGEVRIO to make up for a missed dose.  What are the important possible side effects of LAGEVRIO? ? See, What is the  most important information I should know about LAGEVRIO? ? Allergic Reactions. Allergic reactions can happen in people taking LAGEVRIO, even after only 1 dose. Stop taking LAGEVRIO and call your healthcare provider right away if you get any of the following symptoms of an allergic reaction: o hives o rapid heartbeat o trouble swallowing or breathing o swelling of the mouth, lips, or face o throat tightness o hoarseness o skin rash The most common side effects of LAGEVRIO are: ? diarrhea ? nausea ? dizziness These are not all the possible side effects of LAGEVRIO. Not many people have taken LAGEVRIO. Serious and unexpected side effects may happen. This medicine is still being studied, so it is possible that all of the risks are not known at this time.  What other treatment choices are there?  Veklury (remdesivir) is FDA-approved as an intravenous (IV) infusion for the treatment of mildto-moderate FYBOF-75 in certain adults and children. Talk with your doctor to see if Marijean Heath is appropriate for you. Like  LAGEVRIO, FDA may also allow for the emergency use of other medicines to treat people with COVID-19. Go to LacrosseProperties.si for more information. It is your choice to be treated or not to be treated with LAGEVRIO. Should you decide not to take it, it will not change your standard medical care.  What if I am breastfeeding? Breastfeeding is not recommended during treatment with LAGEVRIO and for 4 days after the last dose of LAGEVRIO. If you are breastfeeding or plan to breastfeed, talk to your healthcare provider about your options and specific situation before taking LAGEVRIO.  How do I report side effects with LAGEVRIO? Contact your healthcare provider if you have any side effects that bother you or do not go away. Report side effects to FDA MedWatch at SmoothHits.hu or  call 1-800-FDA-1088 (1- 903-140-0483).  How should I store [place]? ? Store LAGEVRIO capsules at room temperature between 57F to 73F (20C to 25C). ? Keep LAGEVRIO and all medicines out of the reach of children and pets. How can I learn more about COVID-19? ? Ask your healthcare provider. ? Visit SeekRooms.co.uk ? Contact your local or state public health department. ? Call [facility] at [phone] (toll free in the [place] ? Visit [email or web address].com  What Is an Emergency Use Authorization (EUA)? The [place] FDA has made [place] available under an emergency access mechanism called an Emergency Use Authorization (EUA) The EUA is supported by a Presenter, broadcasting Health and Human Service (HHS) declaration that circumstances exist to justify emergency use of drugs and biological products during the COVID-19 pandemic. LAGEVRIO for the treatment of mild-to-moderate COVID-19 in adults with positive results of direct SARS-CoV-2 viral testing, who are at high risk for progression to severe COVID-19, including hospitalization or death, and for whom alternative COVID-19 treatment options approved or authorized by FDA are not accessible or clinically appropriate, has not undergone the same type of review as an FDA-approved product. In issuing an EUA under the HDIXB-84 public health emergency, the FDA has determined, among other things, that based on the total amount of scientific evidence available including data from adequate and well-controlled clinical trials, if available, it is reasonable to believe that the product may be effective for diagnosing, treating, or preventing COVID-19, or a serious or life-threatening disease or condition caused by COVID-19; that the known and potential benefits of the product, when used to diagnose, treat, or prevent such disease or condition, outweigh the known and potential risks of such product; and that there are no adequate, approved,  and available alternatives.  All of these criteria must be met to allow for the product to be used in the treatment of patients during the COVID-19 pandemic. The EUA for LAGEVRIO is in effect for the duration of the COVID-19 declaration justifying emergency use of LAGEVRIO, unless terminated or revoked (after which LAGEVRIO may no longer be used under the EUA). For patent information: http://rogers.info/ Copyright  2021-2022 Section., [place], [place] and its affiliates. All rights reserved. [id number] Revised: March 2022

## 2021-10-02 ENCOUNTER — Ambulatory Visit: Payer: Medicare Other | Admitting: Cardiology

## 2021-10-03 ENCOUNTER — Ambulatory Visit: Payer: Medicare Other | Admitting: Physical Medicine and Rehabilitation

## 2021-10-03 ENCOUNTER — Ambulatory Visit: Payer: Self-pay

## 2021-10-03 ENCOUNTER — Other Ambulatory Visit: Payer: Self-pay

## 2021-10-03 VITALS — BP 107/77 | HR 73

## 2021-10-03 DIAGNOSIS — M5416 Radiculopathy, lumbar region: Secondary | ICD-10-CM | POA: Diagnosis not present

## 2021-10-03 MED ORDER — METHYLPREDNISOLONE ACETATE 80 MG/ML IJ SUSP
80.0000 mg | Freq: Once | INTRAMUSCULAR | Status: AC
  Administered 2021-10-03: 80 mg

## 2021-10-03 NOTE — Progress Notes (Signed)
Pt state lower back pain that travels to her right leg. Pt state walking, standing and getting out of a chair makes the pain worse. Pt state takes over the counter pain meds and uses pain cream to help ease her pain.  Numeric Pain Rating Scale and Functional Assessment Average Pain 5   In the last MONTH (on 0-10 scale) has pain interfered with the following?  1. General activity like being  able to carry out your everyday physical activities such as walking, climbing stairs, carrying groceries, or moving a chair?  Rating(10)   +Driver, +BT, -Dye Allergies.

## 2021-10-03 NOTE — Patient Instructions (Signed)

## 2021-10-04 ENCOUNTER — Other Ambulatory Visit: Payer: Self-pay | Admitting: Internal Medicine

## 2021-10-05 ENCOUNTER — Telehealth: Payer: Self-pay

## 2021-10-05 NOTE — Telephone Encounter (Signed)
The patient was scheduled for LAAO 08/24/2021 but the procedure was aborted due to unfavorable appendage anatomy with large ostium and lack of adequate depth.  She was scheduled for procedure follow-up 10/02/21 with Kathyrn Drown and cancelled her visit due to positive Covid test.  Called to check in on patient. She states she feels fine and her Covid symptoms have all subsided.  Confirmed appointment scheduled with Dr. Tamala Julian 11/20/2021.  She was grateful for call.

## 2021-10-09 ENCOUNTER — Telehealth: Payer: Self-pay | Admitting: Internal Medicine

## 2021-10-09 NOTE — Telephone Encounter (Signed)
Patient called states the recommendations did not work for her and she is wanting further advise.

## 2021-10-09 NOTE — Telephone Encounter (Signed)
Spoke with pt. Pt states that she has had severe constipation since being on the antiviral for covid. Pt reports that she has not had a substantial bm in 2 weeks and could not remember when her last bm was but states that it was just pebbles. Pt states she has tried taking Citrucel and colace at night and has been trying to eat more fiber but nothing helps. Gave pt instructions for Miralax purge and instructed pt to give [place] a call back if her bowel habits do not return to normal. Pt verbalized understanding and had no other concerns at end of call.

## 2021-10-09 NOTE — Telephone Encounter (Signed)
Spoke with pt and she is aware of Dr. Blanch Media recommendations and will call back if she still has issues.

## 2021-10-09 NOTE — Telephone Encounter (Signed)
Inbound call from patient states she have been constipated for 2 weeks and nothing is helping.

## 2021-10-09 NOTE — Telephone Encounter (Signed)
1.  Take 2 Dulcolax tabs 2.  8 doses of MiraLAX (17 g each) in 32 ounces of water or juice or Gatorade.  Drink over 1 to 2 hours 3.  May repeat 2 Dulcolax tabs in the morning if she has not had adequate BMs by then

## 2021-10-09 NOTE — Telephone Encounter (Signed)
Pt states she has tried all the recommendations in the note below and it did not work. She reports she has not had a BM in over a week. Suggested she try an enema to break up any hard stool and repeat the miralax purge but pt states she never has had any luck with enemas. She states she is still taking the citrucel and colace at night with no results either. Please advise.

## 2021-10-10 NOTE — Telephone Encounter (Signed)
Spoke with pt and she states the miralax worked to well, reports she now has diarrhea. Discussed with her it will need to run its course, do not take imodium as it could cause her to be constipated again. Instructed pt to drink plenty of fluids and to have a bland diet. Pt verbalized understanding.

## 2021-10-10 NOTE — Telephone Encounter (Signed)
Patient called and stated that she wanted to update you on what is going on, and that she is in need of further instructions. Seeking advice, please advise.

## 2021-10-17 NOTE — Procedures (Signed)
Lumbosacral Transforaminal Epidural Steroid Injection - Sub-Pedicular Approach with Fluoroscopic Guidance  Patient: Hailey [name]      Date of Birth: 01-Dec-1934 MRN: [id number] PCP: Cassandria Anger, MD      Visit Date: 10/03/2021   Universal Protocol:    Date/Time: 10/03/2021  Consent Given By: the patient  Position: PRONE  Additional Comments: Vital signs were monitored before and after the procedure. Patient was prepped and draped in the usual sterile fashion. The correct patient, procedure, and site was verified.   Injection Procedure Details:   Procedure diagnoses: Lumbar radiculopathy [M54.16]    Meds Administered:  Meds ordered this encounter  Medications   methylPREDNISolone acetate (DEPO-MEDROL) injection 80 mg    Laterality: Right  Location/Site: L5  Needle:5.0 in., 22 ga.  Short bevel or Quincke spinal needle  Needle Placement: Transforaminal  Findings:    -Comments: Excellent flow of contrast along the nerve, nerve root and into the epidural space.  Procedure Details: After squaring off the end-plates to get a true AP view, the C-arm was positioned so that an oblique view of the foramen as noted above was visualized. The target area is just inferior to the "nose of the scotty dog" or sub pedicular. The soft tissues overlying this structure were infiltrated with 2-3 ml. of 1% Lidocaine without Epinephrine.  The spinal needle was inserted toward the target using a "trajectory" view along the fluoroscope beam.  Under AP and lateral visualization, the needle was advanced so it did not puncture dura and was located close the 6 O'Clock position of the pedical in AP tracterory. Biplanar projections were used to confirm position. Aspiration was confirmed to be negative for CSF and/or blood. A 1-2 ml. volume of Isovue-250 was injected and flow of contrast was noted at each level. Radiographs were obtained for documentation purposes.   After attaining the desired  flow of contrast documented above, a 0.5 to 1.0 ml test dose of 0.25% Marcaine was injected into each respective transforaminal space.  The patient was observed for 90 seconds post injection.  After no sensory deficits were reported, and normal lower extremity motor function was noted,   the above injectate was administered so that equal amounts of the injectate were placed at each foramen (level) into the transforaminal epidural space.   Additional Comments:  No complications occurred Dressing: 2 x 2 sterile gauze and Band-Aid    Post-procedure details: Patient was observed during the procedure. Post-procedure instructions were reviewed.  Patient left the clinic in stable condition.

## 2021-10-17 NOTE — Progress Notes (Signed)
Hailey [name] - 87 y.o. female MRN [id number]  Date of birth: 01/26/1935  Office Visit Note: Visit Date: 10/03/2021 PCP: Cassandria Anger, MD Referred by: Cassandria Anger, MD  Subjective: Chief Complaint  Patient presents with   Lower Back - Pain   Right Leg - Pain   HPI:  Hailey [name] is a 87 y.o. female who comes in today at the request of Dr. Meridee Score for planned Right L5-S1 Lumbar Transforaminal epidural steroid injection with fluoroscopic guidance.  The patient has failed conservative care including home exercise, medications, time and activity modification.  This injection will be diagnostic and hopefully therapeutic.  Please see requesting physician notes for further details and justification.  ROS Otherwise per HPI.  Assessment & Plan: Visit Diagnoses:    ICD-10-CM   1. Lumbar radiculopathy  M54.16 XR C-ARM NO REPORT    Epidural Steroid injection    methylPREDNISolone acetate (DEPO-MEDROL) injection 80 mg      Plan: No additional findings.   Meds & Orders:  Meds ordered this encounter  Medications   methylPREDNISolone acetate (DEPO-MEDROL) injection 80 mg    Orders Placed This Encounter  Procedures   XR C-ARM NO REPORT   Epidural Steroid injection    Follow-up: Return if symptoms worsen or fail to improve.   Procedures: No procedures performed  Lumbosacral Transforaminal Epidural Steroid Injection - Sub-Pedicular Approach with Fluoroscopic Guidance  Patient: Hailey [name]      Date of Birth: [birth year] MRN: [id number] PCP: Cassandria Anger, MD      Visit Date: 10/03/2021   Universal Protocol:    Date/Time: 10/03/2021  Consent Given By: the patient  Position: PRONE  Additional Comments: Vital signs were monitored before and after the procedure. Patient was prepped and draped in the usual sterile fashion. The correct patient, procedure, and site was verified.   Injection Procedure Details:   Procedure diagnoses: Lumbar  radiculopathy [M54.16]    Meds Administered:  Meds ordered this encounter  Medications   methylPREDNISolone acetate (DEPO-MEDROL) injection 80 mg    Laterality: Right  Location/Site: L5  Needle:5.0 in., 22 ga.  Short bevel or Quincke spinal needle  Needle Placement: Transforaminal  Findings:    -Comments: Excellent flow of contrast along the nerve, nerve root and into the epidural space.  Procedure Details: After squaring off the end-plates to get a true AP view, the C-arm was positioned so that an oblique view of the foramen as noted above was visualized. The target area is just inferior to the "nose of the scotty dog" or sub pedicular. The soft tissues overlying this structure were infiltrated with 2-3 ml. of 1% Lidocaine without Epinephrine.  The spinal needle was inserted toward the target using a "trajectory" view along the fluoroscope beam.  Under AP and lateral visualization, the needle was advanced so it did not puncture dura and was located close the 6 O'Clock position of the pedical in AP tracterory. Biplanar projections were used to confirm position. Aspiration was confirmed to be negative for CSF and/or blood. A 1-2 ml. volume of Isovue-250 was injected and flow of contrast was noted at each level. Radiographs were obtained for documentation purposes.   After attaining the desired flow of contrast documented above, a 0.5 to 1.0 ml test dose of 0.25% Marcaine was injected into each respective transforaminal space.  The patient was observed for 90 seconds post injection.  After no sensory deficits were reported, and normal lower extremity motor function was noted,  the above injectate was administered so that equal amounts of the injectate were placed at each foramen (level) into the transforaminal epidural space.   Additional Comments:  No complications occurred Dressing: 2 x 2 sterile gauze and Band-Aid    Post-procedure details: Patient was observed during the  procedure. Post-procedure instructions were reviewed.  Patient left the clinic in stable condition.    Clinical History: No specialty comments available.     Objective:  VS:  HT:     WT:    BMI:      BP:107/77   HR:73bpm   TEMP: ( )   RESP:  Physical Exam Vitals and nursing note reviewed.  Constitutional:      General: She is not in acute distress.    Appearance: Normal appearance. She is not ill-appearing.  HENT:     Head: Normocephalic and atraumatic.     Right Ear: External ear normal.     Left Ear: External ear normal.  Eyes:     Extraocular Movements: Extraocular movements intact.  Cardiovascular:     Rate and Rhythm: Normal rate.     Pulses: Normal pulses.  Pulmonary:     Effort: Pulmonary effort is normal. No respiratory distress.  Abdominal:     General: There is no distension.     Palpations: Abdomen is soft.  Musculoskeletal:        General: Tenderness present.     Cervical back: Neck supple.     Right lower leg: No edema.     Left lower leg: No edema.     Comments: Patient has good distal strength with no pain over the greater trochanters.  No clonus or focal weakness.  Skin:    Findings: No erythema, lesion or rash.  Neurological:     General: No focal deficit present.     Mental Status: She is alert and oriented to person, place, and time.     Sensory: No sensory deficit.     Motor: No weakness or abnormal muscle tone.     Coordination: Coordination normal.  Psychiatric:        Mood and Affect: Mood normal.        Behavior: Behavior normal.     Imaging: No results found.

## 2021-11-03 ENCOUNTER — Other Ambulatory Visit: Payer: Self-pay | Admitting: Internal Medicine

## 2021-11-07 ENCOUNTER — Ambulatory Visit: Payer: Medicare Other | Admitting: Physical Medicine and Rehabilitation

## 2021-11-07 ENCOUNTER — Other Ambulatory Visit: Payer: Self-pay

## 2021-11-07 ENCOUNTER — Encounter: Payer: Self-pay | Admitting: Physical Medicine and Rehabilitation

## 2021-11-07 VITALS — BP 80/51 | HR 67

## 2021-11-07 DIAGNOSIS — M5416 Radiculopathy, lumbar region: Secondary | ICD-10-CM

## 2021-11-07 DIAGNOSIS — M5441 Lumbago with sciatica, right side: Secondary | ICD-10-CM

## 2021-11-07 DIAGNOSIS — G8929 Other chronic pain: Secondary | ICD-10-CM

## 2021-11-07 DIAGNOSIS — M419 Scoliosis, unspecified: Secondary | ICD-10-CM | POA: Diagnosis not present

## 2021-11-07 DIAGNOSIS — M48061 Spinal stenosis, lumbar region without neurogenic claudication: Secondary | ICD-10-CM

## 2021-11-07 NOTE — Progress Notes (Signed)
Pt state lower back pain that travels down both legs. Pt state sitting, walking and standing makes the pain worse. Pt state she takes over the counter pain meds to help ease her pain.  Numeric Pain Rating Scale and Functional Assessment Average Pain 10 Pain Right Now 3 My pain is intermittent, dull, and aching Pain is worse with: walking, sitting, and standing Pain improves with: medication   In the last MONTH (on 0-10 scale) has pain interfered with the following?  1. General activity like being  able to carry out your everyday physical activities such as walking, climbing stairs, carrying groceries, or moving a chair?  Rating(6)  2. Relation with others like being able to carry out your usual social activities and roles such as  activities at home, at work and in your community. Rating(7)  3. Enjoyment of life such that you have  been bothered by emotional problems such as feeling anxious, depressed or irritable?  Rating(8)

## 2021-11-07 NOTE — Progress Notes (Signed)
Hailey [name] - 87 y.o. female MRN [id number]  Date of birth: 1934-11-28  Office Visit Note: Visit Date: 11/07/2021 PCP: Cassandria Anger, MD Referred by: Cassandria Anger, MD  Subjective: Chief Complaint  Patient presents with   Lower Back - Pain   Right Leg - Pain   Left Leg - Pain   HPI: Hailey [name] is a 87 y.o. female who comes in today for evaluation of chronic, worsening and severe bilateral lower back pain radiating to right anterolateral thigh down to knee. Patient reports pain has been ongoing for several months and is exacerbated by movement and activity. Patient describes pain as a constant soreness and currently rates as 7 out of 10. Patient reports some relief of pain with home exercise regimen, rest and use of medications. Patients lumbar MRI from 2013 exhibits lumbar scoliosis centered at L1-L2 with convexity to the right, multi-level facet arthropathy, 3 mm retrolisthesis of L2 on L3 and foraminal stenosis on the right at L4-L5 that could affect the L4 nerve root. No high grade spinal canal stenosis noted. Patient had right L5 transforaminal epidural steroid injection on 10/03/2021 performed by Dr. Magnus Sinning and reports significant pain relief for approximately 2 weeks. Patient attributes short pain relief to overexertion while on vacation at the beginning of February. Patient states her pain has steadily increased over the last several weeks. Patient states she is a very active person and reports her pain is making it difficult to accomplish daily tasks. Patient reports history of fall in November of 2022 and was treated for traumatic right lower leg wound by Dr. Meridee Score. Patient recently underwent procedure to have Watchman placed, however device was unable to be implanted due to inadequate anatomy. Patient denies focal weakness, numbness and tingling. Patient denies recent trauma or falls.   Review of Systems  Musculoskeletal:  Positive for back pain.   Neurological:  Negative for tingling, sensory change, focal weakness and weakness.  All other systems reviewed and are negative. Otherwise per HPI.  Assessment & Plan: Visit Diagnoses:    ICD-10-CM   1. Lumbar radiculopathy  M54.16 MR LUMBAR SPINE WO CONTRAST    2. Chronic bilateral low back pain with right-sided sciatica  M54.41 MR LUMBAR SPINE WO CONTRAST   G89.29     3. Foraminal stenosis of lumbar region  M48.061 MR LUMBAR SPINE WO CONTRAST    4. Scoliosis of lumbar spine, unspecified scoliosis type  M41.9 MR LUMBAR SPINE WO CONTRAST       Plan: Findings:  Chronic, worsening and severe bilateral lower back pain radiating to right anterolateral thigh down to knee. Patient continues to have severe pain despite good conservative therapies such as home exercise regimen, rest and use of medications. Patients clinical presentation and exam are consistent with L5 nerve pattern. Patients recent right L5 transforaminal epidural steroid injection was short lived and provided some pain relief for 2 weeks. We believe the next step is to obtain new lumbar MRI imaging which we did place an order for today. I spoke with patient about claustrophobia and discussed pre-procedure sedation to help her relax during procedure. Patient does take Ativan at home as needed for anxiety and would like to take prior to lumbar MRI which we are agreeable with. We will have patient follow up with [place] after lumbar MRI imaging is complete for review and to discuss further treatment options. We would consider repeating right L5 transforaminal epidural steroid injection, but will wait to evaluate new  lumbar MRI before proceeding with second injection. No red flag symptoms noted upon exam today.    Meds & Orders: No orders of the defined types were placed in this encounter.   Orders Placed This Encounter  Procedures   MR LUMBAR SPINE WO CONTRAST    Follow-up: Return for follow-up after lumbar MRI imaging is complete for  review.   Procedures: No procedures performed      Clinical History: MRI LUMBAR SPINE WITHOUT CONTRAST   Technique:  Multiplanar and multiecho pulse sequences of the lumbar  spine were obtained without intravenous contrast.   Comparison: Radiographs dated 06/04/2012 and lumbar MRI dated  06/23/2009   Findings: Tip of the conus is at L1 appears normal.   T10-11 and T11-12:  Normal.   T12-L1:  Tiny broad-based disc bulge with no neural impingement,  unchanged.   L1-2:  Lumbar scoliosis centered at L1-2 with convexity to the  right.  Narrowing of the left side of the disc space with  degenerative changes of the endplates.  Minimal asymmetric  osteophytes extending to the left lateral recess without neural  impingement.  This has slightly progressed.   L2-3:  3 mm retrolisthesis of L2 on L3 with a small broad-based  disc bulge with no neural impingement.  This has slightly  increased. Mild right facet arthritis.   L3-4:  Small asymmetric disc bulge to the left with no neural  impingement.  This has slightly increased.   L4-5:  Asymmetric disc space narrowing to the right with scoliosis  with convexity to the left.  Right foraminal stenosis.  Severe  right facet arthritis.  No disc protrusion.  I suspect the right L4  nerve may be affected in the lateral aspect of the neural foramen.  Appearance is essentially unchanged.   L5 S1:  Marked disc space narrowing with moderate left foraminal  stenosis, unchanged.   IMPRESSION:  1.  Slight progression of degenerative disc disease at L1-2, L2-3  and L3-4.  2.  Right foraminal stenosis at L4-5 which might affect the right  L4 nerve.  This is unchanged.  3.  No other focal neural impingement.    Original Report Authenticated By: Larey Seat, M.D.   She reports that she quit smoking about 42 years ago. Her smoking use included cigarettes. She has a 7.00 pack-year smoking history. She has never used smokeless tobacco. No  results for input(s): HGBA1C, LABURIC in the last 8760 hours.  Objective:  VS:  HT:     WT:    BMI:      BP:(!) 80/51   HR:67bpm   TEMP: ( )   RESP:  Physical Exam Vitals and nursing note reviewed.  HENT:     Head: Normocephalic and atraumatic.     Right Ear: External ear normal.     Left Ear: External ear normal.     Nose: Nose normal.     Mouth/Throat:     Mouth: Mucous membranes are moist.  Eyes:     Extraocular Movements: Extraocular movements intact.  Cardiovascular:     Rate and Rhythm: Normal rate.     Pulses: Normal pulses.  Pulmonary:     Effort: Pulmonary effort is normal.  Abdominal:     General: Abdomen is flat. There is no distension.  Musculoskeletal:        General: Tenderness present.     Cervical back: Normal range of motion.     Comments: Pt is slow to rise from seated  position to standing. Good lumbar range of motion. Strong distal strength without clonus, no pain upon palpation of greater trochanters. Dysesthesias noted to right L5 dermatome. Sensation intact bilaterally. Walks independently, gait steady.   Skin:    General: Skin is warm and dry.     Capillary Refill: Capillary refill takes less than 2 seconds.  Neurological:     General: No focal deficit present.     Mental Status: She is alert and oriented to person, place, and time.  Psychiatric:        Mood and Affect: Mood normal.        Behavior: Behavior normal.    Ortho Exam  Imaging: No results found.  Past Medical/Family/Surgical/Social History: Medications & Allergies reviewed per EMR, new medications updated. Patient Active Problem List   Diagnosis Date Noted   Shortness of breath 06/12/2021   Right leg pain 02/21/2021   Diverticulitis 01/08/2021   Acute respiratory failure with hypoxia (HCC)    Bacterial colitis    Abnormal CT of the abdomen    Diarrhea of infectious origin    Acute colitis 08/27/2020   Aortic atherosclerosis ([place]) 08/27/2020   Acute prerenal azotemia 08/27/2020    Shock circulatory ([place]) 32/08/2481   Metabolic acidosis with normal anion gap and bicarbonate losses 08/27/2020   Dehydration    Squamous cell cancer of skin of left cheek 50/11/7046   Lichen sclerosus 88/91/6945   Abdominal cramping 03/16/2020   Neurogenic orthostatic hypotension ([place]) 09/16/2019   Injury of face 08/31/2019   Leg abrasion, infected, right, subsequent encounter 08/28/2019   Face lacerations 08/26/2019   Nose fracture 08/26/2019   Concussion 08/26/2019   Wrist pain, acute, right 08/26/2019   Greater trochanteric pain syndrome of right lower extremity 07/30/2019   Viral URI with cough 08/15/2018   Abdominal pain 05/22/2018   Traumatic hematoma of right knee 01/10/2018   Ulcer of right knee (HCC)    Bradycardia with 41-50 beats per minute 12/27/2017   Right bundle branch block (RBBB) on electrocardiogram (ECG) 12/27/2017   Quadriceps tendon rupture 12/24/2017   CKD (chronic kidney disease), stage II 12/24/2017   Cellulitis of leg, right with large prepatella hematoma and open wounds 12/24/2017   Iliotibial band syndrome of right side 12/16/2017   Depression 07/11/2017   Intervertebral lumbar disc disorder with myelopathy, lumbar region 04/25/2017   Family history of colon cancer in mother 01/22/2017   Primary osteoarthritis of both first carpometacarpal joints 01/02/2017   Pain 11/21/2016   Gastroenteritis 09/13/2016   Chronic anticoagulation 05/24/2015   Coronary artery disease involving native coronary artery of native heart with angina pectoris ([place]) 12/20/2014   Iliotibial band syndrome of left side 11/16/2014   Diverticulitis of colon 08/30/2014   Chest pain, atypical 07/03/2014   Diarrhea 12/22/2013   Permanent atrial fibrillation ([place]) 11/21/2012   Greater trochanteric bursitis of both hips 05/21/2012   Dizzinesses 11/07/2011   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   Osteoarthritis of left hip 07/03/2010   DEGENERATIVE DISC DISEASE, LUMBOSACRAL SPINE  05/18/2010   SYNCOPE 10/27/2008   Essential hypertension 06/16/2007   GERD (gastroesophageal reflux disease) 06/16/2007   COLONIC POLYPS, HX OF 06/16/2007   Past Medical History:  Diagnosis Date   Acute blood loss anemia 12/25/2017   Allergic rhinitis    Anxiety    Arthritis    "my whole spine" (07/01/2017)   Atrial fibrillation (HCC)    Bowel obstruction ([place])    in [place]   Bradycardia with 41-50 beats per  minute 12/27/2017   Cancer (HCC)    Cellulitis of leg, right with large prepatella hematoma and open wounds 12/26/2017   CKD (chronic kidney disease) stage 2, GFR 60-89 ml/min    Colon polyps    Coronary artery disease    10/18 PCI/DES to p/m LCx with cutting balloon to mLcx   Diverticulosis of colon    GERD (gastroesophageal reflux disease)    Hip bursitis 2010   Dr Para March, Post op seroma   History of colon polyps    HSV (herpes simplex virus) anogenital infection 07/2019   HTN (hypertension)    IBS (irritable bowel syndrome)    constipation predominant - Dr Earlean Shawl   Lichen sclerosus    Osteopenia 11/2016   T score -2.0 FRAX 15%/4.3%   PAC (premature atrial contraction)    Symptomatiic   Renal insufficiency    Right bundle branch block (RBBB) on electrocardiogram (ECG) 12/27/2017   Scoliosis    SVT (supraventricular tachycardia) (HCC)    brief history   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   biopsy showing vulvar atypia, possible VIN I   Family History  Problem Relation Age of Onset   Colon cancer Mother        Dx age 65, died at age 55   Diabetes Father    Prostate cancer Father    Prostate cancer Brother    Pancreatic cancer Brother    Stomach cancer Son    Heart attack Neg Hx    Stroke Neg Hx    Esophageal cancer Neg Hx    Past Surgical History:  Procedure Laterality Date   ANTERIOR AND POSTERIOR VAGINAL REPAIR  01/2002   Archie Endo 01/23/2011   APPENDECTOMY  1948   CARDIAC CATHETERIZATION  06/26/2017   CORONARY ANGIOPLASTY WITH STENT  PLACEMENT  07/01/2017   CORONARY ATHERECTOMY N/A 07/01/2017   Procedure: CORONARY ATHERECTOMY;  Surgeon: Belva Crome, MD;  Location: [facility];  Service: Cardiovascular;  Laterality: N/A;   CORONARY STENT INTERVENTION N/A 07/01/2017   Procedure: CORONARY STENT INTERVENTION;  Surgeon: Belva Crome, MD;  Location: [facility];  Service: Cardiovascular;  Laterality: N/A;   HAMMER TOE SURGERY     HEMORRHOID BANDING     HIP SURGERY Left 04/2009   hip examination under anesthesia followed by greater trochanteric bursectomy; iliotibial band tenotomy/notes 01/20/2011   I & D EXTREMITY Right 01/10/2018   Procedure: IRRIGATION AND DEBRIDEMENT RIGHT KNEE, APPLY WOUND VAC;  Surgeon: Newt Minion, MD;  Location: [facility];  Service: Orthopedics;  Laterality: Right;   KNEE BURSECTOMY Right 04/2009   Archie Endo 01/09/2011   LEFT ATRIAL APPENDAGE OCCLUSION N/A 08/24/2021   Procedure: LEFT ATRIAL APPENDAGE OCCLUSION;  Surgeon: Sherren Mocha, MD;  Location: [facility];  Service: Cardiovascular;  Laterality: N/A;   LEFT HEART CATH AND CORONARY ANGIOGRAPHY N/A 06/26/2017   Procedure: LEFT HEART CATH AND CORONARY ANGIOGRAPHY;  Surgeon: Belva Crome, MD;  Location: [facility];  Service: Cardiovascular;  Laterality: N/A;   PUBOVAGINAL SLING  01/2002   Archie Endo 01/23/2011   REDUCTION MAMMAPLASTY     TEE WITHOUT CARDIOVERSION N/A 08/24/2021   Procedure: TRANSESOPHAGEAL ECHOCARDIOGRAM (TEE);  Surgeon: Sherren Mocha, MD;  Location: [facility];  Service: Cardiovascular;  Laterality: N/A;   TEMPORARY PACEMAKER N/A 07/01/2017   Procedure: TEMPORARY PACEMAKER;  Surgeon: Belva Crome, MD;  Location: [facility];  Service: Cardiovascular;  Laterality: N/A;   VAGINAL HYSTERECTOMY  01/2002   Vaginal hysterectomy, bilateral salpingo-oophorectomy/notes 01/23/2011   Social History   Occupational History   Occupation: Patent attorney: RETIRED  Tobacco Use   Smoking  status: Former    Packs/day: 0.25    Years: 28.00    Pack years: 7.00    Types: Cigarettes    Quit date: 1981    Years since quitting: 42.1   Smokeless tobacco: Never  Vaping Use   Vaping Use: Never used  Substance and Sexual Activity   Alcohol use: Yes    Comment: 7 vodka drinks a week   Drug use: Never   Sexual activity: Not Currently    Birth control/protection: Surgical    Comment: hysterectomy

## 2021-11-08 ENCOUNTER — Telehealth: Payer: Self-pay | Admitting: Interventional Cardiology

## 2021-11-08 NOTE — Telephone Encounter (Signed)
Pt c/o medication issue: ? ?1. Name of Medication: spironolactone (ALDACTONE) 25 MG tablet ? ?2. How are you currently taking this medication (dosage and times per day)? 12.5 mg every other day ? ?3. Are you having a reaction (difficulty breathing--STAT)? no ? ?4. What is your medication issue? Patient states has been having diarrhea last week and this week. She says she has a sensitive digestive system and would like to know if the medication could be causing this.  ?

## 2021-11-08 NOTE — Telephone Encounter (Signed)
Spoke with pt and she said she's been having issues with diarrhea over the last couple of weeks.  She has GI issues and was just investigating if it may be coming from her Spironolactone.  Explained to pt that she has been on this for some time and only takes 12.5mg  three times per week, so it is unlikely that it would be the culprit.  Encouraged pt to contact PCP or GI doctor to discuss.  Pt appreciative for call.  ?

## 2021-11-13 ENCOUNTER — Telehealth: Payer: Self-pay | Admitting: *Deleted

## 2021-11-13 NOTE — Telephone Encounter (Signed)
Patient called today stating Vulvar lesion has returned she had referral to [facility] ( in Oct. 2022)  however the patient reports she never heard from her office. At the time the lesion was better and did not wish to proceed with referral. Now issue has returned, do you want patient to referred or schedule office visit to see you again? Please advise  ?

## 2021-11-13 NOTE — Telephone Encounter (Signed)
Please schedule an office visit with me to reassess.  ?It has been several months since she has been here.  ?I am happy to help. ?

## 2021-11-13 NOTE — Telephone Encounter (Signed)
Message sent to appointments to schedule. 

## 2021-11-14 NOTE — Telephone Encounter (Signed)
Patient scheduled on 11/20/21 for office visit.  ?

## 2021-11-15 NOTE — Progress Notes (Addendum)
GYNECOLOGY  VISIT   HPI: 87 y.o.   Married  Caucasian  female   G2P2001 with No LMP recorded. Patient has had a hysterectomy.   here for follow up on vulvar lesion.   States her symptoms resolved and then returned in February after back from [place]. Having vulvar pain and itching.  Using Clobetasol ointment, which helps some of the itching. Using at night only.  Not using OTC treatments.  No vaginal discharge or odor.   Has chronic vulvitis and biopsy 07/28/20 showed chronic inflammation.  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  Dealing with stomach cramps.  Feels almost like menstrual cramping.  Has diarrhea and constipation.  Sees Dr. Rushie Nyhan.   She has lower abdominal pain for a couple of weeks.   GYNECOLOGIC HISTORY: No LMP recorded. Patient has had a hysterectomy. Contraception:  Hyst Menopausal hormone therapy:  none Last mammogram:   01-18-21 3D/Neg/BiRads1 Last pap smear:   Years ago        OB History     Gravida  2   Para  2   Term  2   Preterm      AB      Living  1      SAB      IAB      Ectopic      Multiple      Live Births                 Patient Active Problem List   Diagnosis Date Noted   Shortness of breath 06/12/2021   Right leg pain 02/21/2021   Diverticulitis 01/08/2021   Acute respiratory failure with hypoxia (HCC)    Bacterial colitis    Abnormal CT of the abdomen    Diarrhea of infectious origin    Acute colitis 08/27/2020   Aortic atherosclerosis ([place]) 08/27/2020   Acute prerenal azotemia 08/27/2020   Shock circulatory ([place]) 61/60/7371   Metabolic acidosis with normal anion gap and bicarbonate losses 08/27/2020   Dehydration    Squamous cell cancer of skin of left cheek 03/05/9484   Lichen sclerosus 46/27/0350   Abdominal cramping 03/16/2020   Neurogenic orthostatic hypotension ([place]) 09/16/2019   Injury of face 08/31/2019   Leg abrasion, infected, right, subsequent encounter 08/28/2019   Face  lacerations 08/26/2019   Nose fracture 08/26/2019   Concussion 08/26/2019   Wrist pain, acute, right 08/26/2019   Greater trochanteric pain syndrome of right lower extremity 07/30/2019   Viral URI with cough 08/15/2018   Abdominal pain 05/22/2018   Traumatic hematoma of right knee 01/10/2018   Ulcer of right knee (HCC)    Bradycardia with 41-50 beats per minute 12/27/2017   Right bundle branch block (RBBB) on electrocardiogram (ECG) 12/27/2017   Quadriceps tendon rupture 12/24/2017   CKD (chronic kidney disease), stage II 12/24/2017   Cellulitis of leg, right with large prepatella hematoma and open wounds 12/24/2017   Iliotibial band syndrome of right side 12/16/2017   Depression 07/11/2017   Intervertebral lumbar disc disorder with myelopathy, lumbar region 04/25/2017   Family history of colon cancer in mother 01/22/2017   Primary osteoarthritis of both first carpometacarpal joints 01/02/2017   Pain 11/21/2016   Gastroenteritis 09/13/2016   Chronic anticoagulation 05/24/2015   Coronary artery disease involving native coronary artery of native heart with angina pectoris ([place]) 12/20/2014   Iliotibial band syndrome of left side 11/16/2014   Diverticulitis of colon 08/30/2014   Chest pain, atypical 07/03/2014  Diarrhea 12/22/2013   Permanent atrial fibrillation ([place]) 11/21/2012   Greater trochanteric bursitis of both hips 05/21/2012   Dizzinesses 11/07/2011   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   Osteoarthritis of left hip 07/03/2010   DEGENERATIVE [place] DISEASE, LUMBOSACRAL SPINE 05/18/2010   SYNCOPE 10/27/2008   Essential hypertension 06/16/2007   GERD (gastroesophageal reflux disease) 06/16/2007   COLONIC POLYPS, HX OF 06/16/2007    Past Medical History:  Diagnosis Date   Acute blood loss anemia 12/25/2017   Allergic rhinitis    Anxiety    Arthritis    "my whole spine" (07/01/2017)   Atrial fibrillation (HCC)    Bowel obstruction ([place])    in [place]   Bradycardia  with 41-50 beats per minute 12/27/2017   Cancer ([place])    Cellulitis of leg, right with large prepatella hematoma and open wounds 12/26/2017   CKD (chronic kidney disease) stage 2, GFR 60-89 ml/min    Colon polyps    Coronary artery disease    10/18 PCI/DES to p/m LCx with cutting balloon to mLcx   Diverticulosis of colon    GERD (gastroesophageal reflux disease)    Hip bursitis 2010   Dr Para March, Post op seroma   History of colon polyps    HSV (herpes simplex virus) anogenital infection 07/2019   HTN (hypertension)    IBS (irritable bowel syndrome)    constipation predominant - Dr Earlean Shawl   Lichen sclerosus    Osteopenia 11/2016   T score -2.0 FRAX 15%/4.3%   PAC (premature atrial contraction)    Symptomatiic   Renal insufficiency    Right bundle branch block (RBBB) on electrocardiogram (ECG) 12/27/2017   Scoliosis    SVT (supraventricular tachycardia) ([place])    brief history   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   biopsy showing vulvar atypia, possible VIN I    Past Surgical History:  Procedure Laterality Date   ANTERIOR AND POSTERIOR VAGINAL REPAIR  01/2002   Archie Endo 01/23/2011   APPENDECTOMY  1946   CARDIAC CATHETERIZATION  06/26/2017   CORONARY ANGIOPLASTY WITH STENT PLACEMENT  07/01/2017   CORONARY ATHERECTOMY N/A 07/01/2017   Procedure: CORONARY ATHERECTOMY;  Surgeon: Belva Crome, MD;  Location: [facility];  Service: Cardiovascular;  Laterality: N/A;   CORONARY STENT INTERVENTION N/A 07/01/2017   Procedure: CORONARY STENT INTERVENTION;  Surgeon: Belva Crome, MD;  Location: [facility];  Service: Cardiovascular;  Laterality: N/A;   HAMMER TOE SURGERY     HEMORRHOID BANDING     HIP SURGERY Left 04/2009   hip examination under anesthesia followed by greater trochanteric bursectomy; iliotibial band tenotomy/notes 01/20/2011   I & D EXTREMITY Right 01/10/2018   Procedure: IRRIGATION AND DEBRIDEMENT RIGHT KNEE, APPLY WOUND VAC;  Surgeon: Newt Minion, MD;  Location: [facility];  Service: Orthopedics;  Laterality: Right;   KNEE BURSECTOMY Right 04/2009   Archie Endo 01/09/2011   LEFT ATRIAL APPENDAGE OCCLUSION N/A 08/24/2021   Procedure: LEFT ATRIAL APPENDAGE OCCLUSION;  Surgeon: Sherren Mocha, MD;  Location: [facility];  Service: Cardiovascular;  Laterality: N/A;   LEFT HEART CATH AND CORONARY ANGIOGRAPHY N/A 06/26/2017   Procedure: LEFT HEART CATH AND CORONARY ANGIOGRAPHY;  Surgeon: Belva Crome, MD;  Location: [facility];  Service: Cardiovascular;  Laterality: N/A;   PUBOVAGINAL SLING  01/2002   Archie Endo 01/23/2011   REDUCTION MAMMAPLASTY     TEE WITHOUT CARDIOVERSION N/A 08/24/2021   Procedure: TRANSESOPHAGEAL ECHOCARDIOGRAM (TEE);  Surgeon:  Sherren Mocha, MD;  Location: [facility];  Service: Cardiovascular;  Laterality: N/A;   TEMPORARY PACEMAKER N/A 07/01/2017   Procedure: TEMPORARY PACEMAKER;  Surgeon: Belva Crome, MD;  Location: [facility];  Service: Cardiovascular;  Laterality: N/A;   VAGINAL HYSTERECTOMY  01/2002   Vaginal hysterectomy, bilateral salpingo-oophorectomy/notes 01/23/2011    Current Outpatient Medications  Medication Sig Dispense Refill   acetaminophen (TYLENOL) 500 MG tablet Take 1,000 mg by mouth every 6 (six) hours as needed (body aches / sore hip). Maximum of 6 tablets daily ('3000mg'$ )     atorvastatin (LIPITOR) 80 MG tablet TAKE 1 TABLET DAILY AT 6PM. 90 tablet 0   benzonatate (TESSALON PERLES) 100 MG capsule 1-2 capsules up to twice daily as needed for cough 30 capsule 0   BIOTIN PO Take 5,000 mcg by mouth in the morning.     diclofenac Sodium (VOLTAREN) 1 % GEL Apply 2 g topically 4 (four) times daily as needed (back pain.).     dicyclomine (BENTYL) 20 MG tablet Take 1 by mouth every 4-6 hours as needed for cramping 30 tablet 3   DULoxetine (CYMBALTA) 60 MG capsule Take 1 capsule (60 mg total) by mouth daily. Overdue for annual physical with labs 30 capsule 2   ELIQUIS 2.5 MG  TABS tablet TAKE 1 TABLET BY MOUTH TWICE DAILY. 180 tablet 1   empagliflozin (JARDIANCE) 10 MG TABS tablet Take 1 tablet (10 mg total) by mouth daily before breakfast. 90 tablet 3   esomeprazole (NEXIUM) 40 MG capsule TAKE ONE CAPSULE ONCE DAILY. 90 capsule 1   Homeopathic Products (ARNICARE) GEL Apply 1 application topically daily as needed (skin bruising).     hyoscyamine (LEVSIN) 0.125 MG tablet TAKE 1-2 TABLETS (0.125-0.25 MG) BY MOUTH EVERY 4 HOURS AS NEEDED FOR UP TO 10 DAYS FOR CRAMPING. (Patient taking differently: Take 0.125-0.25 mg by mouth every 4 (four) hours as needed for cramping.) 100 tablet 1   LORazepam (ATIVAN) 1 MG tablet TAKE ONE TABLET BY MOUTH TWICE DAILY AS NEEDED FOR ANXIETY / SLEEP. (Patient taking differently: Take 1-1.5 mg by mouth at bedtime.) 180 tablet 1   losartan (COZAAR) 25 MG tablet TAKE 1 TABLET ONCE DAILY. 90 tablet 3   methylcellulose (CITRUCEL) oral powder 2 tablespoons daily (Patient taking differently: Take 1 packet by mouth 3 (three) times a week.)     metoprolol succinate (TOPROL-XL) 25 MG 24 hr tablet Take 1 tablet (25 mg total) by mouth daily. 90 tablet 3   nitroGLYCERIN (NITROSTAT) 0.3 MG SL tablet See admin instructions.     nitroGLYCERIN (NITROSTAT) 0.4 MG SL tablet Place 0.4 mg under the tongue every 5 (five) minutes as needed for chest pain (Call 911 at 3rd dose within 15 minutes.).     ondansetron (ZOFRAN-ODT) 4 MG disintegrating tablet DISSOLVE 1 TABLET IN MOUTH EVERY 8 HOURS AS NEEDED FOR NAUSEA OR VOMITING. 30 tablet 3   Peppermint Oil (IBGARD) 90 MG CPCR Take 1 capsule before meals (Patient taking differently: Take 1 capsule by mouth 2 (two) times daily before a meal.)     Polyethyl Glycol-Propyl Glycol (LUBRICANT EYE DROPS) 0.4-0.3 % SOLN Place 1-2 drops into both eyes 3 (three) times daily as needed (dry/irritated eyes.).     saccharomyces boulardii (FLORASTOR) 250 MG capsule Take 1 capsule (250 mg total) by mouth 2 (two) times daily. (Patient  taking differently: Take 250 mg by mouth daily.) 60 capsule 2   sodium chloride (OCEAN) 0.65 % SOLN nasal spray  Place 1 spray into both nostrils at bedtime.     spironolactone (ALDACTONE) 25 MG tablet Take 0.5 tablets (12.5 mg total) by mouth daily. (Patient taking differently: Take 12.5 mg by mouth every Monday, Wednesday, and Friday.) 45 tablet 3   valACYclovir (VALTREX) 500 MG tablet      No current facility-administered medications for this visit.     ALLERGIES: Macrobid [nitrofurantoin monohyd macro], Meloxicam, Digoxin and related, Keflex [cephalexin], Mobic [meloxicam], and Sulfamethoxazole-trimethoprim  Family History  Problem Relation Age of Onset   Colon cancer Mother        Dx age 12, died at age 27   Diabetes Father    Prostate cancer Father    Prostate cancer Brother    Pancreatic cancer Brother    Stomach cancer Son    Heart attack Neg Hx    Stroke Neg Hx    Esophageal cancer Neg Hx     Social History   Socioeconomic History   Marital status: Married    Spouse name: Not on file   Number of children: 2   Years of education: Not on file   Highest education level: Not on file  Occupational History   Occupation: Patent attorney: RETIRED  Tobacco Use   Smoking status: Former    Packs/day: 0.25    Years: 28.00    Pack years: 7.00    Types: Cigarettes    Quit date: 1981    Years since quitting: 42.2   Smokeless tobacco: Never  Vaping Use   Vaping Use: Never used  Substance and Sexual Activity   Alcohol use: Yes    Comment: 7 vodka drinks a week   Drug use: Never   Sexual activity: Not Currently    Birth control/protection: Surgical    Comment: hysterectomy  Other Topics Concern   Not on file  Social History Narrative   Regular Exercise -  YES         Social Determinants of Health   Financial Resource Strain: Low Risk    Difficulty of Paying Living Expenses: Not hard at all  Food Insecurity: No Food Insecurity   Worried About Ship broker in the Last Year: Never true   Ran Out of Food in the Last Year: Never true  Transportation Needs: No Transportation Needs   Lack of Transportation (Medical): No   Lack of Transportation (Non-Medical): No  Physical Activity: Inactive   Days of Exercise per Week: 0 days   Minutes of Exercise per Session: 0 min  Stress: No Stress Concern Present   Feeling of Stress : Not at all  Social Connections: Socially Integrated   Frequency of Communication with Friends and Family: More than three times a week   Frequency of Social Gatherings with Friends and Family: Twice a week   Attends Religious Services: More than 4 times per year   Active Member of [employer] or Organizations: No   Attends Music therapist: More than 4 times per year   Marital Status: Married  Human resources officer Violence: Not At Risk   Fear of Current or Ex-Partner: No   Emotionally Abused: No   Physically Abused: No   Sexually Abused: No    Review of Systems  All other systems reviewed and are negative.  PHYSICAL EXAMINATION:    BP 110/66    Pulse (!) 56    Resp 14    Ht 5' 2.75" (1.594 m)    Wt 143 lb (  64.9 kg)    BMI 25.53 kg/m     General appearance: alert, cooperative and appears stated age   Pelvic: External genitalia:  left labia majora and minora coalescing.  Raised white lesion 1 cm with ulceration medial to this.  Area surrounding has erythema.               Urethra:  normal appearing urethra with no masses, tenderness or lesions          Left vulvar biopsy Consent done.  Sterile prep with betadine.  Local 1% lidocaine, lot [id number], exp 01/09/23.  3 mm punch biopsy done.  Tissue to pathology.  3/0 Vicryl suture placed. Minimal EBL.  No complications.         Chaperone was present for exam:  Estill Bamberg, CMA  ASSESSMENT  Vulvar lesion.  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  Pelvic cramping.  Status post attempted left atrial appendage occlusion.  Atrial fibrillation.   On Eliquis.   PLAN  Fu  biopsy result.  I did discuss potential surgical excision of the area.  Urinalysis and culture.  She will see her PCP next week for her abdominal symptoms.    An After Visit Summary was printed and given to the patient.  20 min  total time was spent for this patient encounter, including preparation, face-to-face counseling with the patient, coordination of care, and documentation of the encounter.

## 2021-11-16 ENCOUNTER — Other Ambulatory Visit (HOSPITAL_COMMUNITY)
Admission: RE | Admit: 2021-11-16 | Discharge: 2021-11-16 | Disposition: A | Discharge status: Home / Self Care | Payer: Medicare Other | Source: Ambulatory Visit | Attending: Obstetrics and Gynecology | Admitting: Obstetrics and Gynecology

## 2021-11-16 ENCOUNTER — Telehealth: Payer: Self-pay | Admitting: Internal Medicine

## 2021-11-16 ENCOUNTER — Encounter: Payer: Self-pay | Admitting: Obstetrics and Gynecology

## 2021-11-16 ENCOUNTER — Ambulatory Visit: Payer: Medicare Other | Admitting: Obstetrics and Gynecology

## 2021-11-16 ENCOUNTER — Other Ambulatory Visit: Payer: Self-pay

## 2021-11-16 VITALS — BP 110/66 | HR 56 | Resp 14 | Ht 62.75 in | Wt 143.0 lb

## 2021-11-16 DIAGNOSIS — N9089 Other specified noninflammatory disorders of vulva and perineum: Secondary | ICD-10-CM

## 2021-11-16 DIAGNOSIS — R102 Pelvic and perineal pain: Secondary | ICD-10-CM

## 2021-11-16 NOTE — Telephone Encounter (Signed)
I'm getting a contraindication for the Augmentin. It says that pt has an allergy to Cephalexin - side effects are nausea, vomiting, fatigue, and headache. Please advise.  ?

## 2021-11-16 NOTE — Telephone Encounter (Signed)
Empiric Augmentin 875 mg twice daily x7 days ?She should keep her appointment with Alonza Bogus, PA-C next week as scheduled ?Okay to use hyoscyamine for abdominal cramping as directed ?Could add MiraLAX 17 g once to twice daily to help soften stools and promote bowel movement ?

## 2021-11-16 NOTE — Telephone Encounter (Signed)
Pt reports lower abd pain that started 2 weeks ago. Pt states pain is more on the right side and is a 7/10. Pt states that pain feels like diverticulitis but is not as severe. Pt reports she started having BM's in the middle of the night that were very soft and then turned to diarrhea. Pt states she took imodium and is now constipated. Pt states she then started taking citrucel and feels like it's not working well now because her last bowel movement was Monday or Tuesday of this week. Pt states she has been taking hyoscyamine for her abd pain and it helped at first but has recently stopped helping. Pt described pain as feeling like menstrual cramps. Last office visit in December stated pt needed a follow up in 3 months. Scheduled pt for appt on 11/21/21 at 1:30 pm with Alonza Bogus, PA.  ? ?As DOD please advise.  ?

## 2021-11-16 NOTE — Patient Instructions (Signed)
Vulva Biopsy, Care After ?This sheet gives you information about how to care for yourself after your procedure. Your health care provider may also give you more specific instructions. If you have problems or questions, contact your health care provider. ?What can I expect after the procedure? ?After the procedure, it is common to have: ?Slight bleeding from the biopsy site. ?Discomfort at the biopsy site. ?Follow these instructions at home: ?Biopsy site care ? ?Follow instructions from your health care provider about how to take care of your biopsy site. Make sure you: ?Clean the area using water and mild soap twice a day or as told by your health care provider. Gently pat the area dry. ?If you were prescribed an antibiotic ointment, apply it as told by your health care provider. Do not stop using the antibiotic even if your condition improves. ?Take a warm water bath (sitz bath) as needed to help with pain and discomfort. A sitz bath is taken while you are sitting down. The water should only come up to your hips and should cover your buttocks. ?Leave stitches (sutures), skin glue, or adhesive strips in place. These skin closures may need to stay in place for 2 weeks or longer. If adhesive strip edges start to loosen and curl up, you may trim the loose edges. Do not remove adhesive strips completely unless your health care provider tells you to do that. ?Check your biopsy site every day for signs of infection. Check for: ?More redness, swelling, or pain. ?More fluid or blood. ?Warmth. ?Pus or a bad smell. ?Do not rub the biopsy area after urinating. Gently pat the area dry or use a bottle filled with warm water (peri-bottle) to clean the area. Gently wipe from front to back. ?Lifestyle ?Wear loose, cotton underwear. Do not wear tight pants. ?Do not use a tampon, douche, or put anything inside your vagina for at least 1 week or until your health care provider approves. ?Do not have sex for at least 1 week or until  your health care provider approves. ?Do not exercise, such as running or biking, until your health care provider approves. ?Do not swim or use a hot tub until your health care provider approves. You may shower or take a sitz bath. ?General instructions ?Take over-the-counter and prescription medicines only as told by your health care provider. ?Use a sanitary napkin until the bleeding stops. ?Keep all follow-up visits as told by your health care provider. This is important. ?Contact a health care provider if: ?You have more redness, swelling, or pain around your biopsy site. ?You have more fluid or blood coming from your biopsy site. ?Your biopsy site feels warm to the touch. ?Your pain is not controlled with medicine. ?Get help right away if you have: ?Heavy bleeding from the vulva. ?Pus or a bad smell coming from your biopsy site. ?A fever. ?Lower abdominal pain. ?Summary ?After the procedure, it is common to have slight bleeding and discomfort at the biopsy site. ?Follow instructions from your health care provider after your biopsy. Make sure you clean the area with water and mild soap. Pat the area dry. ?Take sitz baths as needed to help with pain and discomfort. Leave any sutures in place. ?Check your biopsy site for signs of infection, which may include more redness, swelling, pain, fluid, or blood, or feeling warm to the touch. ?Get help right away if you have heavy bleeding, a fever, pus or a bad smell, or pain in the lower abdomen. ?This information is  not intended to replace advice given to you by your health care provider. Make sure you discuss any questions you have with your health care provider. ?Document Revised: 02/26/2018 Document Reviewed: 02/27/2018 ?Elsevier Patient Education ? [place]. ? ?

## 2021-11-16 NOTE — Telephone Encounter (Signed)
Patient called requesting to speak to the Nurse regarding abdominal pain. ?

## 2021-11-17 ENCOUNTER — Telehealth: Payer: Self-pay | Admitting: *Deleted

## 2021-11-17 ENCOUNTER — Other Ambulatory Visit: Payer: Self-pay

## 2021-11-17 DIAGNOSIS — N390 Urinary tract infection, site not specified: Secondary | ICD-10-CM

## 2021-11-17 DIAGNOSIS — R319 Hematuria, unspecified: Secondary | ICD-10-CM

## 2021-11-17 MED ORDER — AMOXICILLIN-POT CLAVULANATE 250-125 MG PO TABS: 1.0000 | ORAL_TABLET | Freq: Two times a day (BID) | ORAL | 0 refills | Status: DC

## 2021-11-17 NOTE — Telephone Encounter (Signed)
Yes, given the allergy plan stable following: ? ?- Ciprofloxacin 500 mg p.o. every 12 hours ?- Metronidazole 500 mg p.o. every 8 hours ?- 7-day supply of each antibiotic with RF 0.  To follow-up in the GI clinic as scheduled. ?

## 2021-11-17 NOTE — Telephone Encounter (Signed)
Patient called states [facility] does not have Augmentin 250-125 mg tablet in stock. I called [facility] and confirmed, they only have 500 mg dose. I asked patient if she would like me to check with another pharmacy. [facility] [address]. [facility] and [facility] do [facility] that dose in stock with 10 tablets for patient. [facility], patient aware.  ? ?Just FYI  ?

## 2021-11-17 NOTE — Telephone Encounter (Signed)
I went to put in the cipro and flagyl order and got a duplicate therapy notification because pt has already been prescribed Augmentin today for a UTI. Pt will be on the Augmentin for 5 days. Please advise.  ?

## 2021-11-17 NOTE — Telephone Encounter (Signed)
Dr. Hilarie Fredrickson is off today. Please see note below. Should I send something else other than the Augmentin?  ?

## 2021-11-17 NOTE — Telephone Encounter (Signed)
In that case, no need for an additional Abx. To resume Augmentin as prescribed elsewhere.  ?

## 2021-11-17 NOTE — Telephone Encounter (Signed)
Spoke with pt to let her know what Dr. Bryan Lemma had said. Pt verbalized understanding and had no other concerns at end of call.   ?

## 2021-11-19 ENCOUNTER — Observation Stay (HOSPITAL_COMMUNITY)
Admission: EM | Admit: 2021-11-19 | Discharge: 2021-11-20 | Disposition: A | Discharge status: Home / Self Care | Payer: Medicare Other | Attending: Internal Medicine | Admitting: Internal Medicine

## 2021-11-19 ENCOUNTER — Emergency Department (HOSPITAL_COMMUNITY): Payer: Medicare Other

## 2021-11-19 ENCOUNTER — Encounter (HOSPITAL_COMMUNITY): Payer: Self-pay | Admitting: Emergency Medicine

## 2021-11-19 ENCOUNTER — Other Ambulatory Visit: Payer: Self-pay

## 2021-11-19 ENCOUNTER — Encounter: Payer: Self-pay | Admitting: Obstetrics and Gynecology

## 2021-11-19 DIAGNOSIS — K219 Gastro-esophageal reflux disease without esophagitis: Secondary | ICD-10-CM | POA: Diagnosis present

## 2021-11-19 DIAGNOSIS — N182 Chronic kidney disease, stage 2 (mild): Secondary | ICD-10-CM | POA: Insufficient documentation

## 2021-11-19 DIAGNOSIS — D62 Acute posthemorrhagic anemia: Secondary | ICD-10-CM

## 2021-11-19 DIAGNOSIS — I2510 Atherosclerotic heart disease of native coronary artery without angina pectoris: Secondary | ICD-10-CM | POA: Insufficient documentation

## 2021-11-19 DIAGNOSIS — Z7901 Long term (current) use of anticoagulants: Secondary | ICD-10-CM | POA: Diagnosis not present

## 2021-11-19 DIAGNOSIS — Z800 Family history of malignant neoplasm of digestive organs: Secondary | ICD-10-CM | POA: Insufficient documentation

## 2021-11-19 DIAGNOSIS — K5730 Diverticulosis of large intestine without perforation or abscess without bleeding: Secondary | ICD-10-CM | POA: Diagnosis not present

## 2021-11-19 DIAGNOSIS — I4821 Permanent atrial fibrillation: Secondary | ICD-10-CM | POA: Diagnosis not present

## 2021-11-19 DIAGNOSIS — Z955 Presence of coronary angioplasty implant and graft: Secondary | ICD-10-CM | POA: Insufficient documentation

## 2021-11-19 DIAGNOSIS — Z20822 Contact with and (suspected) exposure to covid-19: Secondary | ICD-10-CM | POA: Insufficient documentation

## 2021-11-19 DIAGNOSIS — S0990XA Unspecified injury of head, initial encounter: Secondary | ICD-10-CM | POA: Diagnosis not present

## 2021-11-19 DIAGNOSIS — N179 Acute kidney failure, unspecified: Secondary | ICD-10-CM | POA: Diagnosis not present

## 2021-11-19 DIAGNOSIS — Z859 Personal history of malignant neoplasm, unspecified: Secondary | ICD-10-CM | POA: Insufficient documentation

## 2021-11-19 DIAGNOSIS — N390 Urinary tract infection, site not specified: Secondary | ICD-10-CM | POA: Diagnosis not present

## 2021-11-19 DIAGNOSIS — Z7984 Long term (current) use of oral hypoglycemic drugs: Secondary | ICD-10-CM | POA: Diagnosis not present

## 2021-11-19 DIAGNOSIS — E861 Hypovolemia: Secondary | ICD-10-CM | POA: Diagnosis not present

## 2021-11-19 DIAGNOSIS — Z743 Need for continuous supervision: Secondary | ICD-10-CM | POA: Diagnosis not present

## 2021-11-19 DIAGNOSIS — I700 Atherosclerosis of aorta: Secondary | ICD-10-CM | POA: Diagnosis not present

## 2021-11-19 DIAGNOSIS — K5732 Diverticulitis of large intestine without perforation or abscess without bleeding: Secondary | ICD-10-CM | POA: Diagnosis not present

## 2021-11-19 DIAGNOSIS — I9589 Other hypotension: Secondary | ICD-10-CM | POA: Diagnosis not present

## 2021-11-19 DIAGNOSIS — Z79899 Other long term (current) drug therapy: Secondary | ICD-10-CM | POA: Diagnosis not present

## 2021-11-19 DIAGNOSIS — K5792 Diverticulitis of intestine, part unspecified, without perforation or abscess without bleeding: Secondary | ICD-10-CM

## 2021-11-19 DIAGNOSIS — Z87891 Personal history of nicotine dependence: Secondary | ICD-10-CM | POA: Insufficient documentation

## 2021-11-19 DIAGNOSIS — N3001 Acute cystitis with hematuria: Secondary | ICD-10-CM | POA: Diagnosis not present

## 2021-11-19 DIAGNOSIS — I959 Hypotension, unspecified: Principal | ICD-10-CM | POA: Insufficient documentation

## 2021-11-19 DIAGNOSIS — Z950 Presence of cardiac pacemaker: Secondary | ICD-10-CM | POA: Insufficient documentation

## 2021-11-19 DIAGNOSIS — R6889 Other general symptoms and signs: Secondary | ICD-10-CM | POA: Diagnosis not present

## 2021-11-19 DIAGNOSIS — D649 Anemia, unspecified: Secondary | ICD-10-CM | POA: Diagnosis not present

## 2021-11-19 DIAGNOSIS — R55 Syncope and collapse: Secondary | ICD-10-CM

## 2021-11-19 DIAGNOSIS — R404 Transient alteration of awareness: Secondary | ICD-10-CM | POA: Diagnosis not present

## 2021-11-19 DIAGNOSIS — R0902 Hypoxemia: Secondary | ICD-10-CM | POA: Diagnosis not present

## 2021-11-19 DIAGNOSIS — I4510 Unspecified right bundle-branch block: Secondary | ICD-10-CM | POA: Diagnosis not present

## 2021-11-19 DIAGNOSIS — I130 Hypertensive heart and chronic kidney disease with heart failure and stage 1 through stage 4 chronic kidney disease, or unspecified chronic kidney disease: Secondary | ICD-10-CM | POA: Diagnosis not present

## 2021-11-19 DIAGNOSIS — I6523 Occlusion and stenosis of bilateral carotid arteries: Secondary | ICD-10-CM | POA: Diagnosis not present

## 2021-11-19 DIAGNOSIS — N281 Cyst of kidney, acquired: Secondary | ICD-10-CM | POA: Diagnosis not present

## 2021-11-19 DIAGNOSIS — I5032 Chronic diastolic (congestive) heart failure: Secondary | ICD-10-CM | POA: Diagnosis not present

## 2021-11-19 LAB — CBC WITH DIFFERENTIAL/PLATELET
Abs Immature Granulocytes: 0.04 10*3/uL (ref 0.00–0.07)
Basophils Absolute: 0 10*3/uL (ref 0.0–0.1)
Basophils Relative: 0 %
Eosinophils Absolute: 0.2 10*3/uL (ref 0.0–0.5)
Eosinophils Relative: 2 %
HCT: 28.1 % — ABNORMAL LOW (ref 36.0–46.0)
Hemoglobin: 8.8 g/dL — ABNORMAL LOW (ref 12.0–15.0)
Immature Granulocytes: 0 %
Lymphocytes Relative: 14 %
Lymphs Abs: 1.3 10*3/uL (ref 0.7–4.0)
MCH: 26 pg (ref 26.0–34.0)
MCHC: 31.3 g/dL (ref 30.0–36.0)
MCV: 83.1 fL (ref 80.0–100.0)
Monocytes Absolute: 0.8 10*3/uL (ref 0.1–1.0)
Monocytes Relative: 9 %
Neutro Abs: 6.9 10*3/uL (ref 1.7–7.7)
Neutrophils Relative %: 75 %
Platelets: 310 10*3/uL (ref 150–400)
RBC: 3.38 MIL/uL — ABNORMAL LOW (ref 3.87–5.11)
RDW: 17.6 % — ABNORMAL HIGH (ref 11.5–15.5)
WBC: 9.3 10*3/uL (ref 4.0–10.5)
nRBC: 0 % (ref 0.0–0.2)

## 2021-11-19 LAB — TROPONIN I (HIGH SENSITIVITY): Troponin I (High Sensitivity): 15 ng/L (ref <18)

## 2021-11-19 LAB — COMPREHENSIVE METABOLIC PANEL
ALT: 8 U/L (ref 0–44)
AST: 23 U/L (ref 15–41)
Albumin: 2.8 g/dL — ABNORMAL LOW (ref 3.5–5.0)
Alkaline Phosphatase: 52 U/L (ref 38–126)
Anion gap: 11 (ref 5–15)
BUN: 19 mg/dL (ref 8–23)
CO2: 17 mmol/L — ABNORMAL LOW (ref 22–32)
Calcium: 8.3 mg/dL — ABNORMAL LOW (ref 8.9–10.3)
Chloride: 104 mmol/L (ref 98–111)
Creatinine, Ser: 1.27 mg/dL — ABNORMAL HIGH (ref 0.44–1.00)
GFR, Estimated: 42 mL/min — ABNORMAL LOW (ref >60)
Glucose, Bld: 99 mg/dL (ref 70–99)
Potassium: 4.1 mmol/L (ref 3.5–5.1)
Sodium: 132 mmol/L — ABNORMAL LOW (ref 135–145)
Total Bilirubin: 0.8 mg/dL (ref 0.3–1.2)
Total Protein: 5.7 g/dL — ABNORMAL LOW (ref 6.5–8.1)

## 2021-11-19 LAB — POC OCCULT BLOOD, ED: Fecal Occult Bld: NEGATIVE

## 2021-11-19 MED ORDER — METRONIDAZOLE 500 MG/100ML IV SOLN
500.0000 mg | Freq: Once | INTRAVENOUS | Status: AC
  Administered 2021-11-19: 500 mg via INTRAVENOUS
  Filled 2021-11-19: qty 100

## 2021-11-19 MED ORDER — SODIUM CHLORIDE 0.9 % IV SOLN
2.0000 g | Freq: Once | INTRAVENOUS | Status: AC
  Administered 2021-11-19: 2 g via INTRAVENOUS
  Filled 2021-11-19: qty 2

## 2021-11-19 MED ORDER — SODIUM CHLORIDE 0.9 % IV BOLUS
1000.0000 mL | Freq: Once | INTRAVENOUS | Status: DC
Start: 2021-11-19 — End: 2021-11-19

## 2021-11-19 MED ORDER — VANCOMYCIN HCL IN DEXTROSE 1-5 GM/200ML-% IV SOLN: 1000.0000 mg | Freq: Once | INTRAVENOUS | Status: DC

## 2021-11-19 MED ORDER — LACTATED RINGERS IV BOLUS
1000.0000 mL | Freq: Once | INTRAVENOUS | Status: AC
Start: 2021-11-19 — End: 2021-11-19
  Administered 2021-11-19: 1000 mL via INTRAVENOUS

## 2021-11-19 MED ORDER — LACTATED RINGERS IV BOLUS (SEPSIS)
500.0000 mL | Freq: Once | INTRAVENOUS | Status: AC
  Administered 2021-11-19: 500 mL via INTRAVENOUS

## 2021-11-19 NOTE — ED Notes (Signed)
Date and time results received: 11/19/21 2227 ?(use smartphrase ".now" to insert current time) ? ?Test: lactic acid ?Critical Value: 2.4 ? ?Name of Provider Notified: Debbe Mounts, PA ? ?Orders Received? Or Actions Taken?:  n/a ?

## 2021-11-19 NOTE — ED Notes (Signed)
Pt refusing in and out cath at this time. Pt denies needing to void.  ?

## 2021-11-19 NOTE — ED Notes (Signed)
Pt moved to yellow pod, RN introduced self and worked to make pt comfortable. Pt asked to use bathroom however we compromised on a bedside commode.   ? ?Admitting provider bedside. ?

## 2021-11-19 NOTE — ED Triage Notes (Signed)
Pt BIB EMS from home with c/o hypotension since Friday. States that she possibly accidentally took a double dose of metoprolol. Pt states that she has had to decrease the dose of metoprolol before. Did not take Saturday and Sunday dose of meds. Denies syncopal episodes or chest pain. Pt recently dx with uti and placed on abx. Initial BP with EMS was 74/50, increased to 107/59 with 570m NS.  ?

## 2021-11-19 NOTE — Progress Notes (Unsigned)
Cardiology Office Note:    Date:  11/19/2021   ID:  Hailey [name], DOB 06-12-1935, MRN [id number]  PCP:  Cassandria Anger, MD  Cardiologist:  Sinclair Grooms, MD   Referring MD: Cassandria Anger, MD   No chief complaint on file.   History of Present Illness:    Hailey [name] is a 87 y.o. female with a hx of  PSVT, paroxysmal atrial fibrillation on chronic anticoagulation, CAD with PCI DES of RCA 2018, and hypertension, Abnormal prior myocardial perfusion imaging 1751, A/C diastolic HF 0/2585 improved with increased diuresis, Watchman implantation aborted 08/24/2021 due to poor seal, complicated by groin hematoma..   ***  Past Medical History:  Diagnosis Date   Acute blood loss anemia 12/25/2017   Allergic rhinitis    Anxiety    Arthritis    "my whole spine" (07/01/2017)   Atrial fibrillation (HCC)    Bowel obstruction (HCC)    in [place]   Bradycardia with 41-50 beats per minute 12/27/2017   Cancer ([place])    Cellulitis of leg, right with large prepatella hematoma and open wounds 12/26/2017   CKD (chronic kidney disease) stage 2, GFR 60-89 ml/min    Colon polyps    Coronary artery disease    10/18 PCI/DES to p/m LCx with cutting balloon to mLcx   Diverticulosis of colon    GERD (gastroesophageal reflux disease)    Hip bursitis 2010   Dr Para March, Post op seroma   History of colon polyps    HSV (herpes simplex virus) anogenital infection 07/2019   HTN (hypertension)    IBS (irritable bowel syndrome)    constipation predominant - Dr Earlean Shawl   Lichen sclerosus    Osteopenia 11/2016   T score -2.0 FRAX 15%/4.3%   PAC (premature atrial contraction)    Symptomatiic   Renal insufficiency    Right bundle branch block (RBBB) on electrocardiogram (ECG) 12/27/2017   Scoliosis    SVT (supraventricular tachycardia) ([place])    brief history   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   biopsy showing vulvar atypia, possible VIN I    Past Surgical History:   Procedure Laterality Date   ANTERIOR AND POSTERIOR VAGINAL REPAIR  01/2002   Archie Endo 01/23/2011   APPENDECTOMY  1948   CARDIAC CATHETERIZATION  06/26/2017   CORONARY ANGIOPLASTY WITH STENT PLACEMENT  07/01/2017   CORONARY ATHERECTOMY N/A 07/01/2017   Procedure: CORONARY ATHERECTOMY;  Surgeon: Belva Crome, MD;  Location: [facility];  Service: Cardiovascular;  Laterality: N/A;   CORONARY STENT INTERVENTION N/A 07/01/2017   Procedure: CORONARY STENT INTERVENTION;  Surgeon: Belva Crome, MD;  Location: [facility];  Service: Cardiovascular;  Laterality: N/A;   HAMMER TOE SURGERY     HEMORRHOID BANDING     HIP SURGERY Left 04/2009   hip examination under anesthesia followed by greater trochanteric bursectomy; iliotibial band tenotomy/notes 01/20/2011   I & D EXTREMITY Right 01/10/2018   Procedure: IRRIGATION AND DEBRIDEMENT RIGHT KNEE, APPLY WOUND VAC;  Surgeon: Newt Minion, MD;  Location: [facility];  Service: Orthopedics;  Laterality: Right;   KNEE BURSECTOMY Right 04/2009   Archie Endo 01/09/2011   LEFT ATRIAL APPENDAGE OCCLUSION N/A 08/24/2021   Procedure: LEFT ATRIAL APPENDAGE OCCLUSION;  Surgeon: Sherren Mocha, MD;  Location: [facility];  Service: Cardiovascular;  Laterality: N/A;   LEFT HEART CATH AND CORONARY ANGIOGRAPHY N/A 06/26/2017   Procedure: LEFT HEART CATH AND CORONARY ANGIOGRAPHY;  Surgeon:  Belva Crome, MD;  Location: [facility];  Service: Cardiovascular;  Laterality: N/A;   PUBOVAGINAL SLING  01/2002   Archie Endo 01/23/2011   REDUCTION MAMMAPLASTY     TEE WITHOUT CARDIOVERSION N/A 08/24/2021   Procedure: TRANSESOPHAGEAL ECHOCARDIOGRAM (TEE);  Surgeon: Sherren Mocha, MD;  Location: [facility];  Service: Cardiovascular;  Laterality: N/A;   TEMPORARY PACEMAKER N/A 07/01/2017   Procedure: TEMPORARY PACEMAKER;  Surgeon: Belva Crome, MD;  Location: [facility];  Service: Cardiovascular;  Laterality: N/A;   VAGINAL HYSTERECTOMY  01/2002    Vaginal hysterectomy, bilateral salpingo-oophorectomy/notes 01/23/2011    Current Medications: No outpatient medications have been marked as taking for the 11/20/21 encounter (Appointment) with Belva Crome, MD.     Allergies:   Macrobid [nitrofurantoin monohyd macro], Meloxicam, Digoxin and related, Keflex [cephalexin], Mobic [meloxicam], and Sulfamethoxazole-trimethoprim   Social History   Socioeconomic History   Marital status: Married    Spouse name: Not on file   Number of children: 2   Years of education: Not on file   Highest education level: Not on file  Occupational History   Occupation: Patent attorney: RETIRED  Tobacco Use   Smoking status: Former    Packs/day: 0.25    Years: 28.00    Pack years: 7.00    Types: Cigarettes    Quit date: 1981    Years since quitting: 42.2   Smokeless tobacco: Never  Vaping Use   Vaping Use: Never used  Substance and Sexual Activity   Alcohol use: Yes    Comment: 7 vodka drinks a week   Drug use: Never   Sexual activity: Not Currently    Birth control/protection: Surgical    Comment: hysterectomy  Other Topics Concern   Not on file  Social History Narrative   Regular Exercise -  YES         Social Determinants of Health   Financial Resource Strain: Low Risk    Difficulty of Paying Living Expenses: Not hard at all  Food Insecurity: No Food Insecurity   Worried About Charity fundraiser in the Last Year: Never true   [place] in the Last Year: Never true  Transportation Needs: No Transportation Needs   Lack of Transportation (Medical): No   Lack of Transportation (Non-Medical): No  Physical Activity: Inactive   Days of Exercise per Week: 0 days   Minutes of Exercise per Session: 0 min  Stress: No Stress Concern Present   Feeling of Stress : Not at all  Social Connections: Socially Integrated   Frequency of Communication with Friends and Family: More than three times a week   Frequency of Social  Gatherings with Friends and Family: Twice a week   Attends Religious Services: More than 4 times per year   Active Member of [employer] or Organizations: No   Attends Music therapist: More than 4 times per year   Marital Status: Married     Family History: The patient's family history includes Colon cancer in her mother; Diabetes in her father; Pancreatic cancer in her brother; Prostate cancer in her brother and father; Stomach cancer in her son. There is no history of Heart attack, Stroke, or Esophageal cancer.  ROS:   Please see the history of present illness.    *** All other systems reviewed and are negative.  EKGs/Labs/Other Studies Reviewed:    The following studies were reviewed today: ***  EKG:  EKG ***  Recent Labs: 06/21/2021: ALT 8 07/05/2021: NT-Pro BNP 1,355 08/15/2021: BUN 21; Creatinine, Ser 1.37; Hemoglobin 10.0; Platelets 365; Potassium 4.2; Sodium 138  Recent Lipid Panel    Component Value Date/Time   CHOL 171 11/26/2016 0844   TRIG 99 11/26/2016 0844   TRIG 183 (H) 07/26/2006 0903   HDL 90 11/26/2016 0844   CHOLHDL 1.9 11/26/2016 0844   CHOLHDL 2.2 07/04/2014 0528   VLDL 19 07/04/2014 0528   LDLCALC 61 11/26/2016 0844   LDLDIRECT 89.1 06/29/2013 1604    Physical Exam:    VS:  There were no vitals taken for this visit.    Wt Readings from Last 3 Encounters:  11/16/21 143 lb (64.9 kg)  08/24/21 140 lb (63.5 kg)  08/16/21 140 lb (63.5 kg)     GEN: ***. No acute distress HEENT: Normal NECK: No JVD. LYMPHATICS: No lymphadenopathy CARDIAC: *** murmur. RRR *** gallop, or edema. VASCULAR: *** Normal Pulses. No bruits. RESPIRATORY:  Clear to auscultation without rales, wheezing or rhonchi  ABDOMEN: Soft, non-tender, non-distended, No pulsatile mass, MUSCULOSKELETAL: No deformity  SKIN: Warm and dry NEUROLOGIC:  Alert and oriented x 3 PSYCHIATRIC:  Normal affect   ASSESSMENT:    1. Coronary artery disease involving native coronary  artery of native heart with angina pectoris (JAARS)   2. PAF (paroxysmal atrial fibrillation) ([place])   3. Chronic anticoagulation   4. Neurogenic orthostatic hypotension (HCC)   5. Acute on chronic diastolic heart failure ([place])   6. Right bundle branch block (RBBB) on electrocardiogram (ECG)   7. Hyperlipidemia LDL goal <70    PLAN:    In order of problems listed above:  ***   Medication Adjustments/Labs and Tests Ordered: Current medicines are reviewed at length with the patient today.  Concerns regarding medicines are outlined above.  No orders of the defined types were placed in this encounter.  No orders of the defined types were placed in this encounter.   There are no Patient Instructions on file for this visit.   Signed, Sinclair Grooms, MD  11/19/2021 3:50 PM    [place]

## 2021-11-19 NOTE — ED Provider Notes (Cosign Needed Addendum)
[name] Provider Note   CSN: [id number] Arrival date & time: 11/19/21  2053     History  Chief Complaint  Patient presents with   Hypotension    [name] is a 87 y.o. female with a past medical history of hypertension, SVT, atrial fibrillation (on Eliquis), CAD, CKD stage II,.  Presents emergency department with a complaint of syncopal episode.  Patient reports that she believes she took 2 doses of her metoprolol medication Friday morning.  Patient states that she has been dealing with hypotension since then.  States that she had a syncopal episode today.  Single episode occurred just prior to arrival in the emergency department.  Patient states that she had just finished eating dinner when she stood up to leave the table.  She began to feel lightheaded and had a syncopal episode.  Patient husband reports that he helped slowly lowered her to the floor.  Denies hitting her head.  Single episode lasted less than 1 minute.  Patient denies any preceding chest pain, shortness of breath, sudden onset of headache.  Per triage note patient had blood pressure of 74/50 with EMS.  Patient received 500 mL fluid bolus with improvement in blood pressure.  Patient denies any lightheadedness at present.  Additionally patient complains of lower abdominal pain over the last 2 to 3 weeks.  Pain has been intermittent over this time.  Patient has been taking her hyoscyamine with improvement in her pain.  Patient states that she has had constipation over the last few days.  Last bowel movement was 3 days prior.  Denies any blood in stool or melena.  Patient reports that she is currently on Augmentin for a urinary tract infection.   HPI     Home Medications Prior to Admission medications   Medication Sig Start Date End Date Taking? Authorizing Provider  acetaminophen (TYLENOL) 500 MG tablet Take 1,000 mg by mouth every 6 (six) hours as needed (body aches / sore  hip). Maximum of 6 tablets daily ('3000mg'$ )    [provider]  amoxicillin-clavulanate (AUGMENTIN) 250-125 MG tablet Take 1 tablet by mouth 2 (two) times daily for 5 days. 11/17/21 11/22/21  Nunzio Cobbs, MD  atorvastatin (LIPITOR) 80 MG tablet TAKE 1 TABLET DAILY AT 6PM. 04/03/21   Belva Crome, MD  benzonatate (TESSALON PERLES) 100 MG capsule 1-2 capsules up to twice daily as needed for cough 09/26/21   Colin Benton R, DO  BIOTIN PO Take 5,000 mcg by mouth in the morning.    [provider]  diclofenac Sodium (VOLTAREN) 1 % GEL Apply 2 g topically 4 (four) times daily as needed (back pain.).    [provider]  dicyclomine (BENTYL) 20 MG tablet Take 1 by mouth every 4-6 hours as needed for cramping 08/16/21   Irene Shipper, MD  DULoxetine (CYMBALTA) 60 MG capsule Take 1 capsule (60 mg total) by mouth daily. Overdue for annual physical with labs 11/03/21   Plotnikov, Evie Lacks, MD  ELIQUIS 2.5 MG TABS tablet TAKE 1 TABLET BY MOUTH TWICE DAILY. 08/10/21   Belva Crome, MD  empagliflozin (JARDIANCE) 10 MG TABS tablet Take 1 tablet (10 mg total) by mouth daily before breakfast. 07/04/21   Belva Crome, MD  esomeprazole (NEXIUM) 40 MG capsule TAKE ONE CAPSULE ONCE DAILY. 07/28/21   Plotnikov, Evie Lacks, MD  Homeopathic Products (ARNICARE) GEL Apply 1 application topically daily as needed (skin bruising).  [provider]  hyoscyamine (LEVSIN) 0.125 MG tablet TAKE 1-2 TABLETS (0.125-0.25 MG) BY MOUTH EVERY 4 HOURS AS NEEDED FOR UP TO 10 DAYS FOR CRAMPING. Patient taking differently: Take 0.125-0.25 mg by mouth every 4 (four) hours as needed for cramping. 03/27/20   Plotnikov, Evie Lacks, MD  LORazepam (ATIVAN) 1 MG tablet TAKE ONE TABLET BY MOUTH TWICE DAILY AS NEEDED FOR ANXIETY / SLEEP. Patient taking differently: Take 1-1.5 mg by mouth at bedtime. 07/28/21   Plotnikov, Evie Lacks, MD  losartan (COZAAR) 25 MG tablet TAKE 1 TABLET ONCE DAILY. 07/25/21    Belva Crome, MD  methylcellulose (CITRUCEL) oral powder 2 tablespoons daily Patient taking differently: Take 1 packet by mouth 3 (three) times a week. 06/28/21   Irene Shipper, MD  metoprolol succinate (TOPROL-XL) 25 MG 24 hr tablet Take 1 tablet (25 mg total) by mouth daily. 07/25/21   Belva Crome, MD  nitroGLYCERIN (NITROSTAT) 0.3 MG SL tablet See admin instructions.    [provider]  nitroGLYCERIN (NITROSTAT) 0.4 MG SL tablet Place 0.4 mg under the tongue every 5 (five) minutes as needed for chest pain (Call 911 at 3rd dose within 15 minutes.).    [provider]  ondansetron (ZOFRAN-ODT) 4 MG disintegrating tablet DISSOLVE 1 TABLET IN MOUTH EVERY 8 HOURS AS NEEDED FOR NAUSEA OR VOMITING. 08/16/21   Irene Shipper, MD  Peppermint Oil (IBGARD) 90 MG CPCR Take 1 capsule before meals Patient taking differently: Take 1 capsule by mouth 2 (two) times daily before a meal. 06/28/21   Irene Shipper, MD  Polyethyl Glycol-Propyl Glycol (LUBRICANT EYE DROPS) 0.4-0.3 % SOLN Place 1-2 drops into both eyes 3 (three) times daily as needed (dry/irritated eyes.).    [provider]  saccharomyces boulardii (FLORASTOR) 250 MG capsule Take 1 capsule (250 mg total) by mouth 2 (two) times daily. Patient taking differently: Take 250 mg by mouth daily. 08/30/20   Oswald Hillock, MD  sodium chloride (OCEAN) 0.65 % SOLN nasal spray Place 1 spray into both nostrils at bedtime.    [provider]  spironolactone (ALDACTONE) 25 MG tablet Take 0.5 tablets (12.5 mg total) by mouth daily. Patient taking differently: Take 12.5 mg by mouth every Monday, Wednesday, and Friday. 07/04/21   Belva Crome, MD  valACYclovir (VALTREX) 500 MG tablet     [provider]      Allergies    Macrobid [nitrofurantoin monohyd macro], Meloxicam, Digoxin and related, Keflex [cephalexin], Mobic [meloxicam], and Sulfamethoxazole-trimethoprim    Review of Systems   Review of Systems   Constitutional:  Negative for chills and fever.  Eyes:  Negative for visual disturbance.  Respiratory:  Negative for shortness of breath.   Cardiovascular:  Negative for chest pain.  Gastrointestinal:  Positive for abdominal pain and constipation. Negative for abdominal distention, anal bleeding, blood in stool, diarrhea, nausea, rectal pain and vomiting.  Genitourinary:  Negative for difficulty urinating, dysuria, frequency, urgency, vaginal bleeding, vaginal discharge and vaginal pain.  Musculoskeletal:  Negative for back pain and neck pain.  Skin:  Negative for color change and rash.  Neurological:  Positive for syncope and light-headedness. Negative for dizziness, tremors, seizures, facial asymmetry, speech difficulty, weakness, numbness and headaches.  Psychiatric/Behavioral:  Negative for confusion.    Physical Exam Updated Vital Signs BP 107/69    Pulse 69    Temp 98.2 F (36.8 C)    Resp 18    Ht 5' 2.75" (1.594 m)  Wt 64.9 kg    SpO2 99%    BMI 25.55 kg/m  Physical Exam Vitals and nursing note reviewed. Exam conducted with a chaperone present (Female RN present as chaperone).  Constitutional:      General: She is not in acute distress.    Appearance: She is not ill-appearing, toxic-appearing or diaphoretic.  HENT:     Head: Normocephalic and atraumatic. No raccoon eyes, Battle's sign, contusion, right periorbital erythema, left periorbital erythema or laceration.  Eyes:     General: No scleral icterus.       Right eye: No discharge.        Left eye: No discharge.  Cardiovascular:     Rate and Rhythm: Normal rate. Rhythm irregularly irregular.     Pulses:          Radial pulses are 2+ on the right side and 2+ on the left side.     Heart sounds: Normal heart sounds. No murmur heard. Pulmonary:     Effort: Pulmonary effort is normal. No tachypnea, bradypnea or respiratory distress.     Breath sounds: Normal breath sounds. No stridor.  Abdominal:     General: Abdomen is  flat. There is no distension. There are no signs of injury.     Palpations: Abdomen is soft. There is no mass or pulsatile mass.     Tenderness: There is no abdominal tenderness. There is no guarding or rebound.     Hernia: There is no hernia in the umbilical area or ventral area.  Genitourinary:    Rectum: No mass, tenderness, anal fissure, external hemorrhoid or internal hemorrhoid. Normal anal tone.     Comments: Minimal amount of light brown stool noted in rectal vault.  No frank red blood or melena. Musculoskeletal:     Right lower leg: No edema.     Left lower leg: No edema.     Comments: No midline tenderness or deformity to cervical, thoracic, lumbar spine.  Skin:    General: Skin is warm and dry.  Neurological:     General: No focal deficit present.     Mental Status: She is alert and oriented to person, place, and time.     GCS: GCS eye subscore is 4. GCS verbal subscore is 5. GCS motor subscore is 6.     Cranial Nerves: No dysarthria or facial asymmetry.     Comments: Patient moves all limbs equally without difficulty.  Psychiatric:        Behavior: Behavior is cooperative.    ED Results / Procedures / Treatments   Labs (all labs ordered are listed, but only abnormal results are displayed) Labs Reviewed  COMPREHENSIVE METABOLIC PANEL - Abnormal; Notable for the following components:      Result Value   Sodium 132 (*)    CO2 17 (*)    Creatinine, Ser 1.27 (*)    Calcium 8.3 (*)    Total Protein 5.7 (*)    Albumin 2.8 (*)    GFR, Estimated 42 (*)    All other components within normal limits  CBC WITH DIFFERENTIAL/PLATELET - Abnormal; Notable for the following components:   RBC 3.38 (*)    Hemoglobin 8.8 (*)    HCT 28.1 (*)    RDW 17.6 (*)    All other components within normal limits  LACTIC ACID, PLASMA - Abnormal; Notable for the following components:   Lactic Acid, Venous 2.4 (*)    All other components within normal limits  URINE CULTURE  CULTURE, BLOOD  (ROUTINE X 2)  CULTURE, BLOOD (ROUTINE X 2)  RESP PANEL BY RT-PCR (FLU A&B, COVID) ARPGX2  LACTIC ACID, PLASMA  URINALYSIS, ROUTINE W REFLEX MICROSCOPIC  POC OCCULT BLOOD, ED  TROPONIN I (HIGH SENSITIVITY)  TROPONIN I (HIGH SENSITIVITY)    EKG EKG Interpretation  Date/Time:  Sunday November 19 2021 20:56:58 EDT Ventricular Rate:  68 PR Interval:    QRS Duration: 138 QT Interval:  455 QTC Calculation: 484 R Axis:   42 Text Interpretation: Atrial flutter Right bundle branch block No significant change since last tracing of 24 August 2021 Confirmed by Pattricia Boss [phone]) on 11/19/2021 9:31:47 PM  Radiology CT ABDOMEN PELVIS WO CONTRAST  Result Date: 11/19/2021 CLINICAL DATA:  Left lower quadrant abdominal pain. Diverticulitis with complication suspected. Hypotension EXAM: CT ABDOMEN AND PELVIS WITHOUT CONTRAST TECHNIQUE: Multidetector CT imaging of the abdomen and pelvis was performed following the standard protocol without IV contrast. RADIATION DOSE REDUCTION: This exam was performed according to the departmental dose-optimization program which includes automated exposure control, adjustment of the mA and/or kV according to patient size and/or use of iterative reconstruction technique. COMPARISON:  08/27/2020 FINDINGS: Lower chest: Lung bases are clear.  Mild cardiac enlargement. Hepatobiliary: No focal liver abnormality is seen. No gallstones, gallbladder wall thickening, or biliary dilatation. Pancreas: Fatty infiltration of the pancreas. No acute inflammatory changes. Spleen: Normal in size without focal abnormality. Adrenals/Urinary Tract: No adrenal gland nodules. Cyst on the upper pole of the left kidney measuring 2.7 cm diameter. No change since previous study. No hydronephrosis or hydroureter. No renal or ureteral stones. Bladder is unremarkable. Stomach/Bowel: Stomach, small bowel, and colon are not abnormally distended. Stool fills the colon. Sigmoid colonic diverticulosis with wall  thickening and pericolonic stranding involving the distal descending and proximal/mid sigmoid colon. Changes are likely to represent early acute diverticulitis. Follow-up after resolution of acute process is recommended to exclude underlying neoplasm. Area of involvement is similar to the previous study. No abscess. Appendix is not identified. Vascular/Lymphatic: Aortic atherosclerosis. No enlarged abdominal or pelvic lymph nodes. Reproductive: Status post hysterectomy. No adnexal masses. Other: No free air or free fluid in the abdomen. Abdominal wall musculature appears intact. Musculoskeletal: No acute or significant osseous findings. Degenerative changes in the spine with lumbar scoliosis convex towards the left. Old appearing compression deformity of T10. No change. IMPRESSION: 1. Diverticulosis of the sigmoid colon with wall thickening and pericolonic stranding likely representing acute diverticulitis. No abscess. Area of involvement is similar to the prior study. Suggest follow-up after resolution of acute process to exclude underlying neoplasm. 2. Aortic atherosclerosis. 3. Additional incidental findings as above. Electronically Signed   By: Lucienne Capers M.D.   On: 11/19/2021 23:24   CT HEAD WO CONTRAST (5MM)  Result Date: 11/19/2021 CLINICAL DATA:  Head trauma, minor (Age >= 65y) EXAM: CT HEAD WITHOUT CONTRAST TECHNIQUE: Contiguous axial images were obtained from the base of the skull through the vertex without intravenous contrast. RADIATION DOSE REDUCTION: This exam was performed according to the departmental dose-optimization program which includes automated exposure control, adjustment of the mA and/or kV according to patient size and/or use of iterative reconstruction technique. COMPARISON:  None. BRAIN: BRAIN Cerebral ventricle sizes are concordant with the degree of cerebral volume loss. Patchy and confluent areas of decreased attenuation are noted throughout the deep and periventricular white  matter of the cerebral hemispheres bilaterally, compatible with chronic microvascular ischemic disease. No evidence of large-territorial acute infarction. No parenchymal hemorrhage. No mass lesion. No  extra-axial collection. No mass effect or midline shift. No hydrocephalus. Basilar cisterns are patent. Vascular: No hyperdense vessel. Atherosclerotic calcifications are present within the cavernous internal carotid arteries. Skull: No acute fracture or focal lesion. Sinuses/Orbits: Paranasal sinuses and mastoid air cells are clear. Bilateral lens replacement. Otherwise orbits are unremarkable. Other: None. IMPRESSION: No acute intracranial abnormality. Electronically Signed   By: Iven Finn M.D.   On: 11/19/2021 22:09   DG Chest Portable 1 View  Result Date: 11/19/2021 CLINICAL DATA:  Syncope EXAM: PORTABLE CHEST 1 VIEW COMPARISON:  None. FINDINGS: Linear atelectasis or scarring in the lung bases. Heart is normal size. No effusions. Aorta normal caliber. No acute bony abnormality. IMPRESSION: Bibasilar atelectasis or scarring. Electronically Signed   By: Rolm Baptise M.D.   On: 11/19/2021 21:59    Procedures .Critical Care Performed by: Loni Beckwith, PA-C Authorized by: Loni Beckwith, PA-C   Critical care provider statement:    Critical care time (minutes):  30   Critical care was necessary to treat or prevent imminent or life-threatening deterioration of the following conditions:  Dehydration and sepsis   Critical care was time spent personally by me on the following activities:  Development of treatment plan with patient or surrogate, evaluation of patient's response to treatment, examination of patient, ordering and review of laboratory studies, ordering and review of radiographic studies, ordering and performing treatments and interventions, pulse oximetry, re-evaluation of patient's condition, review of old charts and obtaining history from patient or surrogate   Care discussed  with: admitting provider      Medications Ordered in ED Medications  lactated ringers bolus 500 mL (500 mLs Intravenous New Bag/Given 11/19/21 2300)  ceFEPIme (MAXIPIME) 2 g in sodium chloride 0.9 % 100 mL IVPB (2 g Intravenous New Bag/Given 11/19/21 2300)  metroNIDAZOLE (FLAGYL) IVPB 500 mg (has no administration in time range)  vancomycin (VANCOCIN) IVPB 1000 mg/200 mL premix (has no administration in time range)  lactated ringers bolus 1,000 mL (0 mLs Intravenous Stopped 11/19/21 2300)    ED Course/ Medical Decision Making/ A&P   Alert 87 year old female in no acute distress, nontoxic-appearing.  Presents emergency department for complaint of hypotension and syncope.  Information is obtained from patient and patient's husband at bedside.  Past medical records reviewed including previous provider notes, labs, and imaging.  Patient has past medical history as noted in HPI which complicates her care.  Due to hypotension in the setting of urinary tract infection will obtain lactic acid, urinalysis and urine culture.  Due to patient's syncope will obtain troponin, EKG, chest x-ray, CMP, CBC.  Due to concern for head injury and patient on anticoagulation will obtain noncontrast head CT.  Patient given 1 L fluid bolus for her hypotension.  EKG was independently viewed and interpreted by myself.  Tracing shows atrial flutter reports right bundle branch block.  Chest x-ray was independently viewed and interpreted myself.  Agree with interpretation of radiologist of bibasilar atelectasis versus scarring.  Labs were independently viewed and interpreted by myself.  Pertinent findings include: -Hemoglobin 8.8, patient's baseline appears to be 10 -White blood cell count within normal limits -Lactic acid 2.4 -Creatinine 1.27; appears to be at patient's baseline -Troponin within normal limits  Due to hemoglobin decrease rectal exam was performed.  No frank red blood or melena was noted on rectal exam.   Hemoccult negative.  Patient reports that she has been dealing with abdominal pain over the last 2 to 3 weeks.  Patient has history  of diverticulitis.  With hypotension, increased lactic acid, and anemia concern for possible complications from diverticulitis.  Will obtain noncontrast CT abdomen pelvis for further evaluation.  Unable to use contrast for this imaging as patient's GFR is decreased.  We will start patient empirically on broad-spectrum antibiotics.  We will add 500 mL to complete 30/kg fluid bolus.  Patient did have improvement in pressure after receiving 1 L fluid bolus.  Noncontrast head CT shows no acute intracranial abnormality.  Noncontrast CT abdomen pelvis shows diverticulosis of the sigmoid colon with wall thickening and pericolonic stranding likely representing acute diverticulitis.  No abscess.  Due to concern for sepsis causing hypotension will consult hospitalist for admission.  I spoke with Dr.Tu will see the patient for admission.  Patient care was discussed with attending physician Dr. Jeanell Sparrow.                          Medical Decision Making Amount and/or Complexity of Data Reviewed Labs: ordered. Radiology: ordered.  Risk Prescription drug management. Decision regarding hospitalization.           Final Clinical Impression(s) / ED Diagnoses Final diagnoses:  None    Rx / DC Orders ED Discharge Orders     None         Loni Beckwith, PA-C 11/19/21 2355    Loni Beckwith, PA-C 11/19/21 2356

## 2021-11-20 ENCOUNTER — Telehealth: Payer: Self-pay | Admitting: Internal Medicine

## 2021-11-20 ENCOUNTER — Ambulatory Visit: Payer: Medicare Other | Admitting: Interventional Cardiology

## 2021-11-20 ENCOUNTER — Ambulatory Visit: Payer: Medicare Other | Admitting: Obstetrics and Gynecology

## 2021-11-20 DIAGNOSIS — G903 Multi-system degeneration of the autonomic nervous system: Secondary | ICD-10-CM

## 2021-11-20 DIAGNOSIS — Z7901 Long term (current) use of anticoagulants: Secondary | ICD-10-CM

## 2021-11-20 DIAGNOSIS — I4510 Unspecified right bundle-branch block: Secondary | ICD-10-CM

## 2021-11-20 DIAGNOSIS — E785 Hyperlipidemia, unspecified: Secondary | ICD-10-CM

## 2021-11-20 DIAGNOSIS — I25119 Atherosclerotic heart disease of native coronary artery with unspecified angina pectoris: Secondary | ICD-10-CM

## 2021-11-20 DIAGNOSIS — D62 Acute posthemorrhagic anemia: Secondary | ICD-10-CM

## 2021-11-20 DIAGNOSIS — K5792 Diverticulitis of intestine, part unspecified, without perforation or abscess without bleeding: Secondary | ICD-10-CM

## 2021-11-20 DIAGNOSIS — D649 Anemia, unspecified: Secondary | ICD-10-CM

## 2021-11-20 DIAGNOSIS — I480 Paroxysmal atrial fibrillation: Secondary | ICD-10-CM

## 2021-11-20 DIAGNOSIS — N390 Urinary tract infection, site not specified: Secondary | ICD-10-CM

## 2021-11-20 DIAGNOSIS — I5033 Acute on chronic diastolic (congestive) heart failure: Secondary | ICD-10-CM

## 2021-11-20 DIAGNOSIS — N179 Acute kidney failure, unspecified: Secondary | ICD-10-CM

## 2021-11-20 DIAGNOSIS — N182 Chronic kidney disease, stage 2 (mild): Secondary | ICD-10-CM

## 2021-11-20 LAB — CBC
HCT: 27.4 % — ABNORMAL LOW (ref 36.0–46.0)
Hemoglobin: 8.7 g/dL — ABNORMAL LOW (ref 12.0–15.0)
MCH: 26.3 pg (ref 26.0–34.0)
MCHC: 31.8 g/dL (ref 30.0–36.0)
MCV: 82.8 fL (ref 80.0–100.0)
Platelets: 279 10*3/uL (ref 150–400)
RBC: 3.31 MIL/uL — ABNORMAL LOW (ref 3.87–5.11)
RDW: 17.7 % — ABNORMAL HIGH (ref 11.5–15.5)
WBC: 7.2 10*3/uL (ref 4.0–10.5)
nRBC: 0 % (ref 0.0–0.2)

## 2021-11-20 LAB — VITAMIN B12: Vitamin B-12: 236 pg/mL (ref 180–914)

## 2021-11-20 LAB — URINALYSIS W MICROSCOPIC + REFLEX CULTURE
Bilirubin Urine: NEGATIVE
Hyaline Cast: NONE SEEN /LPF
Ketones, ur: NEGATIVE
Nitrites, Initial: POSITIVE — AB
Protein, ur: NEGATIVE
Specific Gravity, Urine: 1.018 (ref 1.001–1.035)
pH: 5.5 (ref 5.0–8.0)

## 2021-11-20 LAB — IRON AND TIBC
Iron: 17 ug/dL — ABNORMAL LOW (ref 28–170)
Saturation Ratios: 5 % — ABNORMAL LOW (ref 10.4–31.8)
TIBC: 329 ug/dL (ref 250–450)
UIBC: 312 ug/dL

## 2021-11-20 LAB — URINE CULTURE
MICRO NUMBER:: 13112151
SPECIMEN QUALITY:: ADEQUATE

## 2021-11-20 LAB — BASIC METABOLIC PANEL
Anion gap: 6 (ref 5–15)
BUN: 18 mg/dL (ref 8–23)
CO2: 21 mmol/L — ABNORMAL LOW (ref 22–32)
Calcium: 8.5 mg/dL — ABNORMAL LOW (ref 8.9–10.3)
Chloride: 107 mmol/L (ref 98–111)
Creatinine, Ser: 1.06 mg/dL — ABNORMAL HIGH (ref 0.44–1.00)
GFR, Estimated: 52 mL/min — ABNORMAL LOW (ref >60)
Glucose, Bld: 107 mg/dL — ABNORMAL HIGH (ref 70–99)
Potassium: 3.8 mmol/L (ref 3.5–5.1)
Sodium: 134 mmol/L — ABNORMAL LOW (ref 135–145)

## 2021-11-20 LAB — SURGICAL PATHOLOGY

## 2021-11-20 LAB — LACTIC ACID, PLASMA
Lactic Acid, Venous: 1.6 mmol/L (ref 0.5–1.9)
Lactic Acid, Venous: 2.4 mmol/L (ref 0.5–1.9)

## 2021-11-20 LAB — RESP PANEL BY RT-PCR (FLU A&B, COVID) ARPGX2
Influenza A by PCR: NEGATIVE
Influenza B by PCR: NEGATIVE
SARS Coronavirus 2 by RT PCR: NEGATIVE

## 2021-11-20 LAB — CULTURE INDICATED

## 2021-11-20 LAB — FOLATE: Folate: 15.6 ng/mL (ref >5.9)

## 2021-11-20 MED ORDER — ACETAMINOPHEN 325 MG PO TABS
650.0000 mg | ORAL_TABLET | Freq: Four times a day (QID) | ORAL | Status: DC | PRN
  Administered 2021-11-20 (×2): 650 mg via ORAL
  Filled 2021-11-20 (×2): qty 2

## 2021-11-20 MED ORDER — METRONIDAZOLE 500 MG/100ML IV SOLN
500.0000 mg | Freq: Two times a day (BID) | INTRAVENOUS | Status: DC
  Administered 2021-11-20: 500 mg via INTRAVENOUS
  Filled 2021-11-20: qty 100

## 2021-11-20 MED ORDER — SODIUM CHLORIDE 0.9 % IV SOLN: 2.0000 g | INTRAVENOUS | Status: DC

## 2021-11-20 MED ORDER — LORAZEPAM 1 MG PO TABS
1.0000 mg | ORAL_TABLET | Freq: Every day | ORAL | Status: DC
  Administered 2021-11-20: 1 mg via ORAL
  Filled 2021-11-20: qty 1

## 2021-11-20 MED ORDER — PANTOPRAZOLE SODIUM 40 MG PO TBEC
40.0000 mg | DELAYED_RELEASE_TABLET | Freq: Every day | ORAL | Status: DC
  Administered 2021-11-20: 40 mg via ORAL
  Filled 2021-11-20: qty 1

## 2021-11-20 MED ORDER — APIXABAN 2.5 MG PO TABS
2.5000 mg | ORAL_TABLET | Freq: Two times a day (BID) | ORAL | Status: DC
  Administered 2021-11-20 (×2): 2.5 mg via ORAL
  Filled 2021-11-20 (×2): qty 1

## 2021-11-20 MED ORDER — POLYETHYLENE GLYCOL 3350 17 G PO PACK: 17.0000 g | PACK | Freq: Every day | ORAL | Status: DC

## 2021-11-20 NOTE — Assessment & Plan Note (Signed)
-   Has chronic anemia.  However, hemoglobin is down to 8.8 from 10 several months ago.  FOBT is negative and she denies any melena. ?Obtain iron panel, vitamin B12 and folate. ?

## 2021-11-20 NOTE — ED Notes (Signed)
Orthostatic vital signs done, pt denies dizziness, pt ambulatory to bathroom without assistance. Pt states that she is feeling better after thee tylenol  ?

## 2021-11-20 NOTE — Telephone Encounter (Signed)
Spoke with pt. Pt called to cancel appt with Alonza Bogus because pt states she needs to be seen by a doctor. Told pt the next available would be march 29th. Pt stated she can't wait that long and is frustrated because she called last week (Thursday 3/9) and she should have been seen then. Let pt know we can't cancel other patient's appointments. Pt stated that she was referred to Dr. Henrene Pastor by Lyla Son and she should be able to see her doctor. Let pt know that I could send a message to Dr. Henrene Pastor and she could keep her appt with Janett Billow. Pt said that would be ok but wanted to convey that she would prefer to see Dr. Henrene Pastor. Pt states that she has continued to have abd cramping especially every time she eats (see 3/9 telephone encounter). Pt stated her last bm was this morning but was very hard. Pt states she finishes Augmentin for UTI on Tuesday or Wednesday. Pt stated that she wants Dr. Henrene Pastor to review all her records from her recent hospital stay. Please advise.  ?

## 2021-11-20 NOTE — ED Notes (Signed)
Pt in bed, sig other at bedside, pt states that she is ready to go home, verbalized understanding d/c instructions and follow up, ambulatory from dpt.  ?

## 2021-11-20 NOTE — ED Notes (Signed)
Breakfast order placed ?

## 2021-11-20 NOTE — Assessment & Plan Note (Signed)
-  pericolon stranding around sigmoid colon seen on CT abdomen/pelvis. Per radiology, cannot exclude neoplasm due to similar area of involvement than previous study.  Advised patient to follow-up with her gastroenterologist following resolution of her infection. ?-Initially started empirically on IV vancomycin, cefepime and Flagyl.  Will transition to Rocephin and Flagyl. ?

## 2021-11-20 NOTE — Assessment & Plan Note (Signed)
Continue PPI ?

## 2021-11-20 NOTE — Assessment & Plan Note (Signed)
-  creatinine elevated to 1.27 from 0.94 ?-Has received IV bolus in ED. Follow repeat creatinine ?-avoid nephrotoxic agent  ?

## 2021-11-20 NOTE — Assessment & Plan Note (Signed)
-   Continue Eliquis 

## 2021-11-20 NOTE — Telephone Encounter (Signed)
Let her know that I took the time to review everything including call from last week, yesterdays ER visit, labs, and CT ?I want her to take a total of 10 days of Augmentin 875 mg bid ?Take one IB-gard before each meal ?Keep appointment with Jess tomorrow (Tuesday 11/21/21). Let her know that Jess reports directly to me ?She can keep the 3/29 appointment as a follow-up to the visit with Jess. ? ?Thanks, ?Dr. Henrene Pastor  ?

## 2021-11-20 NOTE — Assessment & Plan Note (Signed)
-   Patient recently started on Augmentin for UA on 3/9 showing trace leukocyte, positive nitrite and many bacteria.  Urine culture is pending. ?- already on IV Rocephin for diverticulitis  ?

## 2021-11-20 NOTE — Telephone Encounter (Signed)
Inbound call from patient states she had a recent hospital stay and would like a call back to discuss before appt  ?

## 2021-11-20 NOTE — H&P (Signed)
History and Physical    Patient: Hailey [name] IWL:[id number] DOB: June 17, 1935 DOA: 11/19/2021 DOS: the patient was seen and examined on 11/20/2021 PCP: Cassandria Anger, MD  Patient coming from: Home  Chief Complaint:  Chief Complaint  Patient presents with   Hypotension   HPI: Hailey [name] is for a 87 y.o. female with medical history significant of permanent atrial fibrillation on Eliquis, CAD with PCI DES of RCA in 1740, chronic diastolic heart failure, CKD stage II, hypertension and GERD who presents with syncope with loss of consciousness.  This past weekend she was experiencing fluctuating blood pressure ranging from 814 down to systolic of 70 at times and has been feeling intermittently lightheaded.  Today she had finished dinner and was walking back to the living room when she suddenly felt lightheaded and husband was able to catch her and lower her to the ground.  He believes she was unconscious for about a minute and did not respond to him.  She thinks perhaps she accidentally took multiple doses of her metoprolol. She reports history of syncope and low blood pressure for the past several years.  Also has noted lower abdominal pain and was diagnosed with UTI on 3/10 with UA showing many bacteria and leukocyte at her gynecology office.  Cultures currently pending.  She was started on Augmentin.  Also initially had diarrhea but now has constipation.  She is concerned about diverticulitis with her history of diverticulosis.  Initial BP with EMS was 74/50 which improved to 107/50 with 500 cc of normal saline.  She was given another 1 L of LR in the ED.  No leukocytosis but had worsening anemia down from 10 to 8.8.  FOBT was negative. BMP shows elevated creatinine of 1.27 from prior of 0.94.    CT head is negative . CT abdomen pelvis shows diverticulosis of the sigmoid colon with wall thickening and pericolic stranding likely representing early acute diverticulitis.  No abscess.   Follow-up after resolution of acute process was recommended by radiology to exclude underlying neoplasm due to similar area of involvement in previous study.  Review of Systems: As mentioned in the history of present illness. All other systems reviewed and are negative. Past Medical History:  Diagnosis Date   Acute blood loss anemia 12/25/2017   Allergic rhinitis    Anxiety    Arthritis    "my whole spine" (07/01/2017)   Atrial fibrillation (HCC)    Bowel obstruction (HCC)    in [place]   Bradycardia with 41-50 beats per minute 12/27/2017   Cancer ([place])    Cellulitis of leg, right with large prepatella hematoma and open wounds 12/26/2017   CKD (chronic kidney disease) stage 2, GFR 60-89 ml/min    Colon polyps    Coronary artery disease    10/18 PCI/DES to p/m LCx with cutting balloon to mLcx   Diverticulosis of colon    GERD (gastroesophageal reflux disease)    Hip bursitis 2010   Dr Para March, Post op seroma   History of colon polyps    HSV (herpes simplex virus) anogenital infection 07/2019   HTN (hypertension)    IBS (irritable bowel syndrome)    constipation predominant - Dr Earlean Shawl   Lichen sclerosus    Osteopenia 11/2016   T score -2.0 FRAX 15%/4.3%   PAC (premature atrial contraction)    Symptomatiic   Renal insufficiency    Right bundle branch block (RBBB) on electrocardiogram (ECG) 12/27/2017   Scoliosis    SVT (  supraventricular tachycardia) ([place])    brief history   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   biopsy showing vulvar atypia, possible VIN I   Past Surgical History:  Procedure Laterality Date   ANTERIOR AND POSTERIOR VAGINAL REPAIR  01/2002   Archie Endo 01/23/2011   APPENDECTOMY  1948   CARDIAC CATHETERIZATION  06/26/2017   CORONARY ANGIOPLASTY WITH STENT PLACEMENT  07/01/2017   CORONARY ATHERECTOMY N/A 07/01/2017   Procedure: CORONARY ATHERECTOMY;  Surgeon: Belva Crome, MD;  Location: [facility];  Service: Cardiovascular;  Laterality:  N/A;   CORONARY STENT INTERVENTION N/A 07/01/2017   Procedure: CORONARY STENT INTERVENTION;  Surgeon: Belva Crome, MD;  Location: [facility];  Service: Cardiovascular;  Laterality: N/A;   HAMMER TOE SURGERY     HEMORRHOID BANDING     HIP SURGERY Left 04/2009   hip examination under anesthesia followed by greater trochanteric bursectomy; iliotibial band tenotomy/notes 01/20/2011   I & D EXTREMITY Right 01/10/2018   Procedure: IRRIGATION AND DEBRIDEMENT RIGHT KNEE, APPLY WOUND VAC;  Surgeon: Newt Minion, MD;  Location: [facility];  Service: Orthopedics;  Laterality: Right;   KNEE BURSECTOMY Right 04/2009   Archie Endo 01/09/2011   LEFT ATRIAL APPENDAGE OCCLUSION N/A 08/24/2021   Procedure: LEFT ATRIAL APPENDAGE OCCLUSION;  Surgeon: Sherren Mocha, MD;  Location: [facility];  Service: Cardiovascular;  Laterality: N/A;   LEFT HEART CATH AND CORONARY ANGIOGRAPHY N/A 06/26/2017   Procedure: LEFT HEART CATH AND CORONARY ANGIOGRAPHY;  Surgeon: Belva Crome, MD;  Location: [facility];  Service: Cardiovascular;  Laterality: N/A;   PUBOVAGINAL SLING  01/2002   Archie Endo 01/23/2011   REDUCTION MAMMAPLASTY     TEE WITHOUT CARDIOVERSION N/A 08/24/2021   Procedure: TRANSESOPHAGEAL ECHOCARDIOGRAM (TEE);  Surgeon: Sherren Mocha, MD;  Location: [facility];  Service: Cardiovascular;  Laterality: N/A;   TEMPORARY PACEMAKER N/A 07/01/2017   Procedure: TEMPORARY PACEMAKER;  Surgeon: Belva Crome, MD;  Location: [facility];  Service: Cardiovascular;  Laterality: N/A;   VAGINAL HYSTERECTOMY  01/2002   Vaginal hysterectomy, bilateral salpingo-oophorectomy/notes 01/23/2011   Social History:  reports that she quit smoking about 42 years ago. Her smoking use included cigarettes. She has a 7.00 pack-year smoking history. She has never used smokeless tobacco. She reports current alcohol use. She reports that she does not use drugs.  Allergies  Allergen Reactions   Macrobid  [Nitrofurantoin Monohyd Macro] Other (See Comments)    Nausea, stomach cramps, fatigue , headache.   Meloxicam Other (See Comments)    Jittery and headache   Digoxin And Related     headaches   Keflex [Cephalexin] Nausea And Vomiting    Nausea, fatigue, and headache.   Mobic [Meloxicam] Other (See Comments)    Unsure of reaction type   Sulfamethoxazole-Trimethoprim Nausea Only    Family History  Problem Relation Age of Onset   Colon cancer Mother        Dx age 50, died at age 33   Diabetes Father    Prostate cancer Father    Prostate cancer Brother    Pancreatic cancer Brother    Stomach cancer Son    Heart attack Neg Hx    Stroke Neg Hx    Esophageal cancer Neg Hx     Prior to Admission medications   Medication Sig Start Date End Date Taking? Authorizing Provider  acetaminophen (TYLENOL) 500 MG tablet Take 1,000 mg by mouth every 6 (six)  hours as needed (body aches / sore hip). Maximum of 6 tablets daily ('3000mg'$ )    [provider]  amoxicillin-clavulanate (AUGMENTIN) 250-125 MG tablet Take 1 tablet by mouth 2 (two) times daily for 5 days. 11/17/21 11/22/21  Nunzio Cobbs, MD  atorvastatin (LIPITOR) 80 MG tablet TAKE 1 TABLET DAILY AT 6PM. 04/03/21   Belva Crome, MD  benzonatate (TESSALON PERLES) 100 MG capsule 1-2 capsules up to twice daily as needed for cough 09/26/21   Colin Benton R, DO  BIOTIN PO Take 5,000 mcg by mouth in the morning.    [provider]  diclofenac Sodium (VOLTAREN) 1 % GEL Apply 2 g topically 4 (four) times daily as needed (back pain.).    [provider]  dicyclomine (BENTYL) 20 MG tablet Take 1 by mouth every 4-6 hours as needed for cramping 08/16/21   Irene Shipper, MD  DULoxetine (CYMBALTA) 60 MG capsule Take 1 capsule (60 mg total) by mouth daily. Overdue for annual physical with labs 11/03/21   Plotnikov, Evie Lacks, MD  ELIQUIS 2.5 MG TABS tablet TAKE 1 TABLET BY MOUTH TWICE DAILY. 08/10/21   Belva Crome, MD   empagliflozin (JARDIANCE) 10 MG TABS tablet Take 1 tablet (10 mg total) by mouth daily before breakfast. 07/04/21   Belva Crome, MD  esomeprazole (NEXIUM) 40 MG capsule TAKE ONE CAPSULE ONCE DAILY. 07/28/21   Plotnikov, Evie Lacks, MD  Homeopathic Products (ARNICARE) GEL Apply 1 application topically daily as needed (skin bruising).    [provider]  hyoscyamine (LEVSIN) 0.125 MG tablet TAKE 1-2 TABLETS (0.125-0.25 MG) BY MOUTH EVERY 4 HOURS AS NEEDED FOR UP TO 10 DAYS FOR CRAMPING. Patient taking differently: Take 0.125-0.25 mg by mouth every 4 (four) hours as needed for cramping. 03/27/20   Plotnikov, Evie Lacks, MD  LORazepam (ATIVAN) 1 MG tablet TAKE ONE TABLET BY MOUTH TWICE DAILY AS NEEDED FOR ANXIETY / SLEEP. Patient taking differently: Take 1-1.5 mg by mouth at bedtime. 07/28/21   Plotnikov, Evie Lacks, MD  losartan (COZAAR) 25 MG tablet TAKE 1 TABLET ONCE DAILY. 07/25/21   Belva Crome, MD  methylcellulose (CITRUCEL) oral powder 2 tablespoons daily Patient taking differently: Take 1 packet by mouth 3 (three) times a week. 06/28/21   Irene Shipper, MD  metoprolol succinate (TOPROL-XL) 25 MG 24 hr tablet Take 1 tablet (25 mg total) by mouth daily. 07/25/21   Belva Crome, MD  nitroGLYCERIN (NITROSTAT) 0.3 MG SL tablet See admin instructions.    [provider]  nitroGLYCERIN (NITROSTAT) 0.4 MG SL tablet Place 0.4 mg under the tongue every 5 (five) minutes as needed for chest pain (Call 911 at 3rd dose within 15 minutes.).    [provider]  ondansetron (ZOFRAN-ODT) 4 MG disintegrating tablet DISSOLVE 1 TABLET IN MOUTH EVERY 8 HOURS AS NEEDED FOR NAUSEA OR VOMITING. 08/16/21   Irene Shipper, MD  Peppermint Oil (IBGARD) 90 MG CPCR Take 1 capsule before meals Patient taking differently: Take 1 capsule by mouth 2 (two) times daily before a meal. 06/28/21   Irene Shipper, MD  Polyethyl Glycol-Propyl Glycol (LUBRICANT EYE DROPS) 0.4-0.3 % SOLN Place 1-2 drops into  both eyes 3 (three) times daily as needed (dry/irritated eyes.).    [provider]  saccharomyces boulardii (FLORASTOR) 250 MG capsule Take 1 capsule (250 mg total) by mouth 2 (two) times daily. Patient taking differently: Take 250 mg by mouth daily. 08/30/20   Darrick Meigs,  Marge Duncans, MD  sodium chloride (OCEAN) 0.65 % SOLN nasal spray Place 1 spray into both nostrils at bedtime.    [provider]  spironolactone (ALDACTONE) 25 MG tablet Take 0.5 tablets (12.5 mg total) by mouth daily. Patient taking differently: Take 12.5 mg by mouth every Monday, Wednesday, and Friday. 07/04/21   Belva Crome, MD  valACYclovir (VALTREX) 500 MG tablet     [provider]    Physical Exam: Vitals:   11/20/21 0100 11/20/21 0115 11/20/21 0200 11/20/21 0300  BP: (!) 135/91 130/90 107/79 134/79  Pulse: 73 62 67 76  Resp: (!) 22 (!) 25 (!) 23 20  Temp:      SpO2: 92% 95% 98% 95%  Weight:      Height:       Constitutional: NAD, calm, comfortable, thin elderly female appearing younger than stated age laying upright in bed Eyes: lids and conjunctivae normal ENMT: Mucous membranes are moist.  Neck: normal, supple Respiratory: clear to auscultation bilaterally, no wheezing, no crackles. Normal respiratory effort. No accessory muscle use.  Cardiovascular: Regular rate and rhythm, no murmurs / rubs / gallops. No extremity edema. 2+ pedal pulses.  Abdomen: Soft, nondistended, no tenderness, no masses palpated. Bowel sounds positive.  Musculoskeletal: no clubbing / cyanosis. No joint deformity upper and lower extremities. Good ROM, no contractures. Normal muscle tone.  Skin: no rashes, lesions, ulcers.  Neurologic: CN 2-12 grossly intact. Strength 5/5 in all 4.  Psychiatric: Normal judgment and insight. Alert and oriented x 3. Normal mood. Data Reviewed:  See HPI  Assessment and Plan: * Hypotension W/syncope and LOC -likely multifactorial from hypovolemia, worsening anemia -BP  normalized following 1.5L of IV fluid resuscitation in ED -Hold home spirolactone, Losartan and metoprolol    Acute diverticulitis -pericolon stranding around sigmoid colon seen on CT abdomen/pelvis. Per radiology, cannot exclude neoplasm due to similar area of involvement than previous study.  Advised patient to follow-up with her gastroenterologist following resolution of her infection. -Initially started empirically on IV vancomycin, cefepime and Flagyl.  Will transition to Rocephin and Flagyl.  Acute renal failure superimposed on stage 2 chronic kidney disease (HCC) -creatinine elevated to 1.27 from 0.94 -Has received IV bolus in ED. Follow repeat creatinine -avoid nephrotoxic agent   Permanent atrial fibrillation (HCC) - Continue Eliquis  Anemia - Has chronic anemia.  However, hemoglobin is down to 8.8 from 10 several months ago.  FOBT is negative and she denies any melena. Obtain iron panel, vitamin B12 and folate.  UTI (urinary tract infection) - Patient recently started on Augmentin for UA on 3/9 showing trace leukocyte, positive nitrite and many bacteria.  Urine culture is pending. - already on IV Rocephin for diverticulitis   GERD (gastroesophageal reflux disease) - Continue PPI      Advance Care Planning:   Code Status: Full Code   Consults: none  Family Communication: Discussed with husband at bedside.  Severity of Illness: The appropriate patient status for this patient is OBSERVATION. Observation status is judged to be reasonable and necessary in order to provide the required intensity of service to ensure the patient's safety. The patient's presenting symptoms, physical exam findings, and initial radiographic and laboratory data in the context of their medical condition is felt to place them at decreased risk for further clinical deterioration. Furthermore, it is anticipated that the patient will be medically stable for discharge from the hospital within 2  midnights of admission.   Author: Orene Desanctis, DO 11/20/2021 3:07  AM  For on call review [email or web address].

## 2021-11-20 NOTE — Assessment & Plan Note (Addendum)
W/syncope and LOC ?-likely multifactorial from hypovolemia, worsening anemia ?-BP normalized following 1.5L of IV fluid resuscitation in ED ?-Hold home spirolactone, Losartan and metoprolol  ? ?

## 2021-11-21 ENCOUNTER — Encounter: Payer: Self-pay | Admitting: Gastroenterology

## 2021-11-21 ENCOUNTER — Ambulatory Visit: Payer: Medicare Other | Admitting: Gastroenterology

## 2021-11-21 ENCOUNTER — Other Ambulatory Visit: Payer: Self-pay

## 2021-11-21 ENCOUNTER — Other Ambulatory Visit: Payer: Self-pay | Admitting: *Deleted

## 2021-11-21 VITALS — BP 110/60 | HR 82 | Ht 63.0 in | Wt 142.0 lb

## 2021-11-21 DIAGNOSIS — K5792 Diverticulitis of intestine, part unspecified, without perforation or abscess without bleeding: Secondary | ICD-10-CM

## 2021-11-21 DIAGNOSIS — R1030 Lower abdominal pain, unspecified: Secondary | ICD-10-CM | POA: Diagnosis not present

## 2021-11-21 LAB — URINE CULTURE: Culture: <10000 — AB

## 2021-11-21 MED ORDER — CLOBETASOL PROPIONATE 0.05 % EX CREA: TOPICAL_CREAM | CUTANEOUS | 0 refills | Status: DC

## 2021-11-21 MED ORDER — AMOXICILLIN-POT CLAVULANATE 875-125 MG PO TABS
1.0000 | ORAL_TABLET | Freq: Two times a day (BID) | ORAL | 0 refills | Status: DC
Start: 2021-11-21 — End: 2021-12-06

## 2021-11-21 MED ORDER — CLOBETASOL PROPIONATE 0.05 % EX OINT: TOPICAL_OINTMENT | CUTANEOUS | 0 refills | Status: DC

## 2021-11-21 NOTE — Progress Notes (Signed)
Reviewed.  Thank you for seeing her Janett Billow ?

## 2021-11-21 NOTE — Patient Instructions (Addendum)
Pick up and complete Augmentin from Dr. Henrene Pastor.  ? ?Keep follow up appointment with Dr. Henrene Pastor. ? ?If you are age 85 or older, your body mass index should be between 23-30. Your Body mass index is 25.15 kg/m?Marland Kitchen If this is out of the aforementioned range listed, please consider follow up with your Primary Care Provider. ? ?If you are age 90 or younger, your body mass index should be between 19-25. Your Body mass index is 25.15 kg/m?Marland Kitchen If this is out of the aformentioned range listed, please consider follow up with your Primary Care Provider.  ? ?________________________________________________________ ? ?The [facility] providers would like to encourage you to use [facility] to communicate with providers for non-urgent requests or questions.  Due to long hold times on the telephone, sending your provider a message by [facility] may be a faster and more efficient way to get a response.  Please allow 48 business hours for a response.  Please remember that this is for non-urgent requests.  ?_______________________________________________________ ? ?

## 2021-11-21 NOTE — Telephone Encounter (Signed)
Called and spoke with pt. Gave pt message and recommendations. Augmentin sent to pt's pharmacy and pt scheduled for f/u appt on 12/06/21 at 2 pm with Dr. Henrene Pastor. Pt verbalized understanding and had no other concerns at end of call.  ?

## 2021-11-21 NOTE — Progress Notes (Signed)
? ? ? ?11/21/2021 ?Hailey [name] ?[id number] ?10/09/1934 ? ? ?HISTORY OF PRESENT ILLNESS: This is an 87 year old female who is a patient of Dr. Blanch Media.  She is here today for recent ER follow-up.  She actually presented to the emergency department to be evaluated for hypotension and a syncopal episode.  She tells me that she has had these issues with her blood pressure running very low intermittently for years.  She had also been having some lower abdominal cramping.  She saw her gynecologist and they had prescribed low-dose Augmentin.  Then over the weekend she had some lower abdominal cramping, but  ultimately was taken to the emergency department for the blood pressure issues.  Says that her bowel habits have not been right for some time either, alternates between mushy and some constipation.  CT scan of the abdomen and pelvis without contrast was performed and showed the following: ? ?1. Diverticulosis of the sigmoid colon with wall thickening and ?pericolonic stranding likely representing acute diverticulitis. No ?abscess. Area of involvement is similar to the prior study. Suggest ?follow-up after resolution of acute process to exclude underlying ?neoplasm. ?2. Aortic atherosclerosis. ?3. Additional incidental findings as above. ? ?She called our office and Dr. Henrene Pastor reviewed her records and recommended Augmentin 875 mg twice daily for 10 days.  Also suggested that she try some IBgard. ? ? ?Past Medical History:  ?Diagnosis Date  ? Acute blood loss anemia 12/25/2017  ? Allergic rhinitis   ? Anxiety   ? Arthritis   ? "my whole spine" (07/01/2017)  ? Atrial fibrillation ([place])   ? Bowel obstruction ([place])   ? in [place]  ? Bradycardia with 41-50 beats per minute 12/27/2017  ? Cancer [facility])   ? Cellulitis of leg, right with large prepatella hematoma and open wounds 12/26/2017  ? CKD (chronic kidney disease) stage 2, GFR 60-89 ml/min   ? Colon polyps   ? Coronary artery disease   ? 10/18 PCI/DES to p/m LCx with  cutting balloon to mLcx  ? Diverticulosis of colon   ? GERD (gastroesophageal reflux disease)   ? Hip bursitis 2010  ? Dr Para March, Post op seroma  ? History of colon polyps   ? HSV (herpes simplex virus) anogenital infection 07/2019  ? HTN (hypertension)   ? IBS (irritable bowel syndrome)   ? constipation predominant - Dr Earlean Shawl  ? Lichen sclerosus   ? Osteopenia 11/2016  ? T score -2.0 FRAX 15%/4.3%  ? PAC (premature atrial contraction)   ? Symptomatiic  ? Renal insufficiency   ? Right bundle branch block (RBBB) on electrocardiogram (ECG) 12/27/2017  ? Scoliosis   ? SVT (supraventricular tachycardia) ([place])   ? brief history  ? 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 biopsy showing vulvar atypia, possible VIN I  ? ?Past Surgical History:  ?Procedure Laterality Date  ? ANTERIOR AND POSTERIOR VAGINAL REPAIR  01/2002  ? Archie Endo 01/23/2011  ? APPENDECTOMY  1948  ? CARDIAC CATHETERIZATION  06/26/2017  ? CORONARY ANGIOPLASTY WITH STENT PLACEMENT  07/01/2017  ? CORONARY ATHERECTOMY N/A 07/01/2017  ? Procedure: CORONARY ATHERECTOMY;  Surgeon: Belva Crome, MD;  Location: [facility];  Service: Cardiovascular;  Laterality: N/A;  ? CORONARY STENT INTERVENTION N/A 07/01/2017  ? Procedure: CORONARY STENT INTERVENTION;  Surgeon: Belva Crome, MD;  Location: [place] [facility];  Service: Cardiovascular;  Laterality: N/A;  ? HAMMER TOE SURGERY    ? HEMORRHOID BANDING    ? HIP SURGERY  Left 04/2009  ? hip examination under anesthesia followed by greater trochanteric bursectomy; iliotibial band tenotomy/notes 01/20/2011  ? I & D EXTREMITY Right 01/10/2018  ? Procedure: IRRIGATION AND DEBRIDEMENT RIGHT KNEE, APPLY WOUND VAC;  Surgeon: Newt Minion, MD;  Location: [place] [place];  Service: Orthopedics;  Laterality: Right;  ? KNEE BURSECTOMY Right 04/2009  ? Archie Endo 01/09/2011  ? LEFT ATRIAL APPENDAGE OCCLUSION N/A 08/24/2021  ? Procedure: LEFT ATRIAL APPENDAGE OCCLUSION;  Surgeon: Sherren Mocha, MD;  Location: [place] [facility];  Service: Cardiovascular;  Laterality: N/A;  ? LEFT HEART CATH AND CORONARY ANGIOGRAPHY N/A 06/26/2017  ? Procedure: LEFT HEART CATH AND CORONARY ANGIOGRAPHY;  Surgeon: Belva Crome, MD;  Location: [place] [facility];  Service: Cardiovascular;  Laterality: N/A;  ? PUBOVAGINAL SLING  01/2002  ? Archie Endo 01/23/2011  ? REDUCTION MAMMAPLASTY    ? TEE WITHOUT CARDIOVERSION N/A 08/24/2021  ? Procedure: TRANSESOPHAGEAL ECHOCARDIOGRAM (TEE);  Surgeon: Sherren Mocha, MD;  Location: [place];  Service: Cardiovascular;  Laterality: N/A;  ? TEMPORARY PACEMAKER N/A 07/01/2017  ? Procedure: TEMPORARY PACEMAKER;  Surgeon: Belva Crome, MD;  Location: [place] [facility];  Service: Cardiovascular;  Laterality: N/A;  ? VAGINAL HYSTERECTOMY  01/2002  ? Vaginal hysterectomy, bilateral salpingo-oophorectomy/notes 01/23/2011  ? ? reports that she quit smoking about 42 years ago. Her smoking use included cigarettes. She has a 7.00 pack-year smoking history. She has never used smokeless tobacco. She reports current alcohol use. She reports that she does not use drugs. ?family history includes Colon cancer in her mother; Diabetes in her father; Pancreatic cancer in her brother; Prostate cancer in her brother and father; Stomach cancer in her son. ?Allergies  ?Allergen Reactions  ? Macrobid [employer] Macro] Other (See Comments)  ?  Nausea, stomach cramps, fatigue , headache.  ? Meloxicam Other (See Comments)  ?  Jittery and headache  ? Digoxin And Related   ?  headaches  ? Keflex [Cephalexin] Nausea And Vomiting  ?  Nausea, fatigue, and headache.  ? Mobic [Meloxicam] Other (See Comments)  ?  Unsure of reaction type  ? Sulfamethoxazole-Trimethoprim Nausea Only  ? ? ?  ?Outpatient Encounter Medications as of 11/21/2021  ?Medication Sig  ? acetaminophen (TYLENOL) 500 MG tablet Take 1,000 mg by mouth every 6 (six) hours as needed (body aches / sore hip). Maximum of 6 tablets daily ('3000mg'$ )  ? amoxicillin-clavulanate  (AUGMENTIN) 250-125 MG tablet Take 1 tablet by mouth 2 (two) times daily for 5 days.  ? amoxicillin-clavulanate (AUGMENTIN) 875-125 MG tablet Take 1 tablet by mouth 2 (two) times daily.  ? BIOTIN PO Take 5,000 mcg by mouth in the morning.  ? clobetasol ointment (TEMOVATE) 7.51 % Apply 1 application topically to affected area twice daily for 2 weeks and then reduce to twice a week at night.  ? diclofenac Sodium (VOLTAREN) 1 % GEL Apply 2 g topically 4 (four) times daily as needed (back pain.).  ? dicyclomine (BENTYL) 20 MG tablet Take 1 by mouth every 4-6 hours as needed for cramping  ? DULoxetine (CYMBALTA) 60 MG capsule Take 1 capsule (60 mg total) by mouth daily. Overdue for annual physical with labs  ? ELIQUIS 2.5 MG TABS tablet TAKE 1 TABLET BY MOUTH TWICE DAILY. (Patient taking differently: Take 2.5 mg by mouth 2 (two) times daily.)  ? empagliflozin (JARDIANCE) 10 MG TABS tablet Take 1 tablet (10 mg total) by mouth daily before breakfast.  ? esomeprazole (NEXIUM) 40 MG capsule  TAKE ONE CAPSULE ONCE DAILY. (Patient taking differently: 40 mg daily.)  ? Homeopathic Products (ARNICARE) GEL Apply 1 application topically daily as needed (skin bruising).  ? hyoscyamine (LEVSIN) 0.125 MG tablet TAKE 1-2 TABLETS (0.125-0.25 MG) BY MOUTH EVERY 4 HOURS AS NEEDED FOR UP TO 10 DAYS FOR CRAMPING. (Patient taking differently: Take 0.125-0.25 mg by mouth every 4 (four) hours as needed for cramping.)  ? LORazepam (ATIVAN) 1 MG tablet TAKE ONE TABLET BY MOUTH TWICE DAILY AS NEEDED FOR ANXIETY / SLEEP. (Patient taking differently: Take 1-1.5 mg by mouth at bedtime.)  ? losartan (COZAAR) 25 MG tablet TAKE 1 TABLET ONCE DAILY. (Patient taking differently: Take 25 mg by mouth daily.)  ? methylcellulose (CITRUCEL) oral powder 2 tablespoons daily (Patient taking differently: Take 1 packet by mouth 3 (three) times a week.)  ? metoprolol succinate (TOPROL-XL) 25 MG 24 hr tablet Take 1 tablet (25 mg total) by mouth daily.  ? Multiple  Vitamins-Minerals (PRESERVISION AREDS 2+MULTI VIT) CAPS Take 1 capsule by mouth 2 (two) times daily.  ? nitroGLYCERIN (NITROSTAT) 0.4 MG SL tablet Place 0.4 mg under the tongue every 5 (five) minutes as nee

## 2021-11-22 ENCOUNTER — Encounter: Payer: Self-pay | Admitting: Internal Medicine

## 2021-11-22 ENCOUNTER — Telehealth: Payer: Self-pay | Admitting: Hematology

## 2021-11-22 ENCOUNTER — Ambulatory Visit (INDEPENDENT_AMBULATORY_CARE_PROVIDER_SITE_OTHER): Payer: Medicare Other | Admitting: Internal Medicine

## 2021-11-22 ENCOUNTER — Other Ambulatory Visit: Payer: Self-pay

## 2021-11-22 VITALS — BP 138/90 | HR 68 | Temp 97.7°F | Ht 63.0 in | Wt 141.0 lb

## 2021-11-22 DIAGNOSIS — D681 Hereditary factor XI deficiency: Secondary | ICD-10-CM

## 2021-11-22 DIAGNOSIS — D500 Iron deficiency anemia secondary to blood loss (chronic): Secondary | ICD-10-CM

## 2021-11-22 DIAGNOSIS — E538 Deficiency of other specified B group vitamins: Secondary | ICD-10-CM | POA: Diagnosis not present

## 2021-11-22 DIAGNOSIS — I10 Essential (primary) hypertension: Secondary | ICD-10-CM

## 2021-11-22 DIAGNOSIS — B009 Herpesviral infection, unspecified: Secondary | ICD-10-CM | POA: Insufficient documentation

## 2021-11-22 DIAGNOSIS — E861 Hypovolemia: Secondary | ICD-10-CM | POA: Diagnosis not present

## 2021-11-22 DIAGNOSIS — I9589 Other hypotension: Secondary | ICD-10-CM

## 2021-11-22 DIAGNOSIS — D509 Iron deficiency anemia, unspecified: Secondary | ICD-10-CM | POA: Insufficient documentation

## 2021-11-22 DIAGNOSIS — K5792 Diverticulitis of intestine, part unspecified, without perforation or abscess without bleeding: Secondary | ICD-10-CM

## 2021-11-22 DIAGNOSIS — Z7901 Long term (current) use of anticoagulants: Secondary | ICD-10-CM

## 2021-11-22 DIAGNOSIS — L98491 Non-pressure chronic ulcer of skin of other sites limited to breakdown of skin: Secondary | ICD-10-CM | POA: Insufficient documentation

## 2021-11-22 MED ORDER — CYANOCOBALAMIN 1000 MCG/ML IJ SOLN
1000.0000 ug | Freq: Once | INTRAMUSCULAR | Status: AC
  Administered 2021-11-22: 1000 ug via INTRAMUSCULAR

## 2021-11-22 MED ORDER — VITAMIN D3 50 MCG (2000 UT) PO CAPS: 2000.0000 [IU] | ORAL_CAPSULE | Freq: Every day | ORAL | 3 refills | Status: DC

## 2021-11-22 MED ORDER — VITAMIN B-12 1000 MCG SL SUBL: 1.0000 | SUBLINGUAL_TABLET | Freq: Every day | SUBLINGUAL | 3 refills | Status: DC

## 2021-11-22 NOTE — Assessment & Plan Note (Signed)
Better ?Reduce or hold BP Rx while anemic, or when low BP ?

## 2021-11-22 NOTE — Progress Notes (Signed)
Subjective:  Patient ID: Hailey [name], female    DOB: [birth year]  Age: 87 y.o. MRN: [id number]  CC: No chief complaint on file.   HPI Hailey [name] presents for syncope, low BP, diverticulitis, anemia. On Omeprazole    Outpatient Medications Prior to Visit  Medication Sig Dispense Refill   acetaminophen (TYLENOL) 500 MG tablet Take 1,000 mg by mouth every 6 (six) hours as needed (body aches / sore hip). Maximum of 6 tablets daily (3000mg )     amoxicillin-clavulanate (AUGMENTIN) 875-125 MG tablet Take 1 tablet by mouth 2 (two) times daily. 20 tablet 0   BIOTIN PO Take 5,000 mcg by mouth in the morning.     clobetasol ointment (TEMOVATE) 0.05 % Apply 1 application topically to affected area twice daily for 2 weeks and then reduce to twice a week at night. 30 g 0   diclofenac Sodium (VOLTAREN) 1 % GEL Apply 2 g topically 4 (four) times daily as needed (back pain.).     dicyclomine (BENTYL) 20 MG tablet Take 1 by mouth every 4-6 hours as needed for cramping 30 tablet 3   DULoxetine (CYMBALTA) 60 MG capsule Take 1 capsule (60 mg total) by mouth daily. Overdue for annual physical with labs 30 capsule 2   ELIQUIS 2.5 MG TABS tablet TAKE 1 TABLET BY MOUTH TWICE DAILY. (Patient taking differently: Take 2.5 mg by mouth 2 (two) times daily.) 180 tablet 1   empagliflozin (JARDIANCE) 10 MG TABS tablet Take 1 tablet (10 mg total) by mouth daily before breakfast. 90 tablet 3   esomeprazole (NEXIUM) 40 MG capsule TAKE ONE CAPSULE ONCE DAILY. (Patient taking differently: 40 mg daily.) 90 capsule 1   Homeopathic Products (ARNICARE) GEL Apply 1 application topically daily as needed (skin bruising).     hyoscyamine (LEVSIN) 0.125 MG tablet TAKE 1-2 TABLETS (0.125-0.25 MG) BY MOUTH EVERY 4 HOURS AS NEEDED FOR UP TO 10 DAYS FOR CRAMPING. (Patient taking differently: Take 0.125-0.25 mg by mouth every 4 (four) hours as needed for cramping.) 100 tablet 1   LORazepam (ATIVAN) 1 MG tablet TAKE ONE TABLET BY  MOUTH TWICE DAILY AS NEEDED FOR ANXIETY / SLEEP. (Patient taking differently: Take 1-1.5 mg by mouth at bedtime.) 180 tablet 1   losartan (COZAAR) 25 MG tablet TAKE 1 TABLET ONCE DAILY. (Patient taking differently: Take 25 mg by mouth daily.) 90 tablet 3   methylcellulose (CITRUCEL) oral powder 2 tablespoons daily (Patient taking differently: Take 1 packet by mouth 3 (three) times a week.)     metoprolol succinate (TOPROL-XL) 25 MG 24 hr tablet Take 1 tablet (25 mg total) by mouth daily. 90 tablet 3   Multiple Vitamins-Minerals (PRESERVISION AREDS 2+MULTI VIT) CAPS Take 1 capsule by mouth 2 (two) times daily.     nitroGLYCERIN (NITROSTAT) 0.4 MG SL tablet Place 0.4 mg under the tongue every 5 (five) minutes as needed for chest pain (Call 911 at 3rd dose within 15 minutes.).     ondansetron (ZOFRAN-ODT) 4 MG disintegrating tablet DISSOLVE 1 TABLET IN MOUTH EVERY 8 HOURS AS NEEDED FOR NAUSEA OR VOMITING. (Patient taking differently: 4 mg every 8 (eight) hours as needed for nausea or vomiting.) 30 tablet 3   Peppermint Oil (IBGARD) 90 MG CPCR Take 1 capsule before meals (Patient taking differently: Take 1 capsule by mouth 2 (two) times daily before a meal.)     Polyethyl Glycol-Propyl Glycol (LUBRICANT EYE DROPS) 0.4-0.3 % SOLN Place 1-2 drops into both eyes 3 (three) times daily  as needed (dry/irritated eyes.).     saccharomyces boulardii (FLORASTOR) 250 MG capsule Take 1 capsule (250 mg total) by mouth 2 (two) times daily. (Patient taking differently: Take 250 mg by mouth daily.) 60 capsule 2   sodium chloride (OCEAN) 0.65 % SOLN nasal spray Place 1 spray into both nostrils at bedtime as needed for congestion.     spironolactone (ALDACTONE) 25 MG tablet Take 0.5 tablets (12.5 mg total) by mouth daily. (Patient taking differently: Take 12.5 mg by mouth every Monday, Wednesday, and Friday.) 45 tablet 3   atorvastatin (LIPITOR) 80 MG tablet TAKE 1 TABLET DAILY AT 6PM. (Patient not taking: Reported on  11/20/2021) 90 tablet 0   No facility-administered medications prior to visit.    ROS: Review of Systems  Constitutional:  Positive for fatigue. Negative for activity change, appetite change, chills and unexpected weight change.  HENT:  Negative for congestion, mouth sores and sinus pressure.   Eyes:  Negative for visual disturbance.  Respiratory:  Positive for shortness of breath. Negative for cough, chest tightness and wheezing.   Gastrointestinal:  Positive for abdominal pain. Negative for blood in stool, nausea, rectal pain and vomiting.  Genitourinary:  Negative for difficulty urinating, frequency and vaginal pain.  Musculoskeletal:  Negative for back pain and gait problem.  Skin:  Negative for color change, pallor and rash.  Neurological:  Positive for syncope and light-headedness. Negative for dizziness, tremors, weakness, numbness and headaches.  Psychiatric/Behavioral:  Negative for confusion and sleep disturbance.    Objective:  BP 138/90 (BP Location: Left Arm, Patient Position: Sitting, Cuff Size: Large)   Pulse 68   Temp 97.7 F (36.5 C) (Oral)   Ht 5\' 3"  (1.6 m)   Wt 141 lb (64 kg)   SpO2 94%   BMI 24.98 kg/m   BP Readings from Last 3 Encounters:  11/22/21 138/90  11/21/21 110/60  11/20/21 123/78    Wt Readings from Last 3 Encounters:  11/22/21 141 lb (64 kg)  11/21/21 142 lb (64.4 kg)  11/19/21 143 lb 1.3 oz (64.9 kg)    Physical Exam Constitutional:      General: She is not in acute distress.    Appearance: She is well-developed.  HENT:     Head: Normocephalic.     Right Ear: External ear normal.     Left Ear: External ear normal.     Nose: Nose normal.  Eyes:     General:        Right eye: No discharge.        Left eye: No discharge.     Conjunctiva/sclera: Conjunctivae normal.     Pupils: Pupils are equal, round, and reactive to light.  Neck:     Thyroid: No thyromegaly.     Vascular: No JVD.     Trachea: No tracheal deviation.   Cardiovascular:     Rate and Rhythm: Normal rate and regular rhythm.     Heart sounds: Normal heart sounds.  Pulmonary:     Effort: No respiratory distress.     Breath sounds: No stridor. No wheezing.  Abdominal:     General: Bowel sounds are normal. There is no distension.     Palpations: Abdomen is soft. There is no mass.     Tenderness: There is no abdominal tenderness. There is no guarding or rebound.  Musculoskeletal:        General: No tenderness.     Cervical back: Normal range of motion and neck supple. No rigidity.  Lymphadenopathy:     Cervical: No cervical adenopathy.  Skin:    Findings: No erythema or rash.  Neurological:     Cranial Nerves: No cranial nerve deficit.     Motor: No abnormal muscle tone.     Coordination: Coordination normal.     Deep Tendon Reflexes: Reflexes normal.  Psychiatric:        Behavior: Behavior normal.        Thought Content: Thought content normal.        Judgment: Judgment normal.     A total time of 45 minutes was spent preparing to see the patient, reviewing tests, x-rays, operative reports and other medical records.  Also, obtaining history and performing comprehensive physical exam.  Additionally, counseling the patient regarding the above listed issues.   Finally, documenting clinical information in the health ERrecords, coordination of care, educating the patient - anemia, low BP and sybncope. It is a complex case.  Lab Results  Component Value Date   WBC 7.2 11/20/2021   HGB 8.7 (L) 11/20/2021   HCT 27.4 (L) 11/20/2021   PLT 279 11/20/2021   GLUCOSE 107 (H) 11/20/2021   CHOL 171 11/26/2016   TRIG 99 11/26/2016   HDL 90 11/26/2016   LDLDIRECT 89.1 06/29/2013   LDLCALC 61 11/26/2016   ALT 8 11/19/2021   AST 23 11/19/2021   NA 134 (L) 11/20/2021   K 3.8 11/20/2021   CL 107 11/20/2021   CREATININE 1.06 (H) 11/20/2021   BUN 18 11/20/2021   CO2 21 (L) 11/20/2021   TSH 2.11 04/29/2015   INR 1.1 12/02/2019   HGBA1C 5.6  07/04/2014    CT ABDOMEN PELVIS WO CONTRAST  Result Date: 11/19/2021 CLINICAL DATA:  Left lower quadrant abdominal pain. Diverticulitis with complication suspected. Hypotension EXAM: CT ABDOMEN AND PELVIS WITHOUT CONTRAST TECHNIQUE: Multidetector CT imaging of the abdomen and pelvis was performed following the standard protocol without IV contrast. RADIATION DOSE REDUCTION: This exam was performed according to the departmental dose-optimization program which includes automated exposure control, adjustment of the mA and/or kV according to patient size and/or use of iterative reconstruction technique. COMPARISON:  08/27/2020 FINDINGS: Lower chest: Lung bases are clear.  Mild cardiac enlargement. Hepatobiliary: No focal liver abnormality is seen. No gallstones, gallbladder wall thickening, or biliary dilatation. Pancreas: Fatty infiltration of the pancreas. No acute inflammatory changes. Spleen: Normal in size without focal abnormality. Adrenals/Urinary Tract: No adrenal gland nodules. Cyst on the upper pole of the left kidney measuring 2.7 cm diameter. No change since previous study. No hydronephrosis or hydroureter. No renal or ureteral stones. Bladder is unremarkable. Stomach/Bowel: Stomach, small bowel, and colon are not abnormally distended. Stool fills the colon. Sigmoid colonic diverticulosis with wall thickening and pericolonic stranding involving the distal descending and proximal/mid sigmoid colon. Changes are likely to represent early acute diverticulitis. Follow-up after resolution of acute process is recommended to exclude underlying neoplasm. Area of involvement is similar to the previous study. No abscess. Appendix is not identified. Vascular/Lymphatic: Aortic atherosclerosis. No enlarged abdominal or pelvic lymph nodes. Reproductive: Status post hysterectomy. No adnexal masses. Other: No free air or free fluid in the abdomen. Abdominal wall musculature appears intact. Musculoskeletal: No acute or  significant osseous findings. Degenerative changes in the spine with lumbar scoliosis convex towards the left. Old appearing compression deformity of T10. No change. IMPRESSION: 1. Diverticulosis of the sigmoid colon with wall thickening and pericolonic stranding likely representing acute diverticulitis. No abscess. Area of involvement is similar to the prior  study. Suggest follow-up after resolution of acute process to exclude underlying neoplasm. 2. Aortic atherosclerosis. 3. Additional incidental findings as above. Electronically Signed   By: Burman Nieves M.D.   On: 11/19/2021 23:24   CT HEAD WO CONTRAST ( )  Result Date: 11/19/2021 CLINICAL DATA:  Head trauma, minor (Age >= 65y) EXAM: CT HEAD WITHOUT CONTRAST TECHNIQUE: Contiguous axial images were obtained from the base of the skull through the vertex without intravenous contrast. RADIATION DOSE REDUCTION: This exam was performed according to the departmental dose-optimization program which includes automated exposure control, adjustment of the mA and/or kV according to patient size and/or use of iterative reconstruction technique. COMPARISON:  None. BRAIN: BRAIN Cerebral ventricle sizes are concordant with the degree of cerebral volume loss. Patchy and confluent areas of decreased attenuation are noted throughout the deep and periventricular white matter of the cerebral hemispheres bilaterally, compatible with chronic microvascular ischemic disease. No evidence of large-territorial acute infarction. No parenchymal hemorrhage. No mass lesion. No extra-axial collection. No mass effect or midline shift. No hydrocephalus. Basilar cisterns are patent. Vascular: No hyperdense vessel. Atherosclerotic calcifications are present within the cavernous internal carotid arteries. Skull: No acute fracture or focal lesion. Sinuses/Orbits: Paranasal sinuses and mastoid air cells are clear. Bilateral lens replacement. Otherwise orbits are unremarkable. Other: None.  IMPRESSION: No acute intracranial abnormality. Electronically Signed   By: Tish Frederickson M.D.   On: 11/19/2021 22:09   DG Chest Portable 1 View  Result Date: 11/19/2021 CLINICAL DATA:  Syncope EXAM: PORTABLE CHEST 1 VIEW COMPARISON:  None. FINDINGS: Linear atelectasis or scarring in the lung bases. Heart is normal size. No effusions. Aorta normal caliber. No acute bony abnormality. IMPRESSION: Bibasilar atelectasis or scarring. Electronically Signed   By: Charlett Nose M.D.   On: 11/19/2021 21:59    Assessment & Plan:   Problem List Items Addressed This Visit     Acute diverticulitis    Take Augmentin      Anemia, iron deficiency    Likely multifactorial Will ref to see Dr Candise Che for ?IV iron Replenish protein, Vit B12      Chronic anticoagulation    On Eliquis      Essential hypertension    Hold BP meds if BP is too low      Low serum vitamin B12    Will give SQ B12 today         No orders of the defined types were placed in this encounter.     Follow-up: Return in about 6 weeks (around 01/03/2022) for a follow-up visit.  Sonda Primes, MD

## 2021-11-22 NOTE — Patient Instructions (Signed)
Ensure plus ?

## 2021-11-22 NOTE — Assessment & Plan Note (Signed)
Take Augmentin ?

## 2021-11-22 NOTE — Assessment & Plan Note (Signed)
Hold BP meds if BP is too low ?

## 2021-11-22 NOTE — Telephone Encounter (Signed)
Scheduled appt per 3/15 referral. Pt already established with Dr. Irene Limbo. Pt is aware of appt date and time. Pt is aware to arrive 15 mins prior to appt time and to bring and updated insurance card. Pt is aware of appt location.   ?

## 2021-11-22 NOTE — Assessment & Plan Note (Signed)
Will give SQ B12 today 

## 2021-11-22 NOTE — Addendum Note (Signed)
Addended by: Marijean Heath R on: 11/22/2021 11:36 AM ? ? Modules accepted: Orders ? ?

## 2021-11-22 NOTE — Assessment & Plan Note (Signed)
Likely multifactorial ?Will ref to see Dr Irene Limbo for ?IV iron ?Replenish protein, Vit B12 ?

## 2021-11-22 NOTE — Assessment & Plan Note (Signed)
On Eliquis

## 2021-11-23 ENCOUNTER — Ambulatory Visit: Payer: Medicare Other | Admitting: Nurse Practitioner

## 2021-11-23 ENCOUNTER — Encounter: Payer: Self-pay | Admitting: Nurse Practitioner

## 2021-11-23 VITALS — BP 130/82 | HR 61 | Ht 63.5 in | Wt 142.8 lb

## 2021-11-23 DIAGNOSIS — I4821 Permanent atrial fibrillation: Secondary | ICD-10-CM | POA: Diagnosis not present

## 2021-11-23 DIAGNOSIS — E785 Hyperlipidemia, unspecified: Secondary | ICD-10-CM

## 2021-11-23 DIAGNOSIS — I10 Essential (primary) hypertension: Secondary | ICD-10-CM | POA: Diagnosis not present

## 2021-11-23 DIAGNOSIS — I2510 Atherosclerotic heart disease of native coronary artery without angina pectoris: Secondary | ICD-10-CM | POA: Diagnosis not present

## 2021-11-23 DIAGNOSIS — Z7901 Long term (current) use of anticoagulants: Secondary | ICD-10-CM | POA: Diagnosis not present

## 2021-11-23 NOTE — Patient Instructions (Signed)
Medication Instructions:  ?Your physician recommends that you continue on your current medications as directed. Please refer to the Current Medication list given to you today. ? ?*If you need a refill on your cardiac medications before your next appointment, please call your pharmacy* ? ? ?Lab Work: ?LIPIDS (11/29/21) ?If you have labs (blood work) drawn today and your tests are completely normal, you will receive your results only by: ?MyChart Message (if you have MyChart) OR ?A paper copy in the mail ?If you have any lab test that is abnormal or we need to change your treatment, we will call you to review the results. ? ? ?Testing/Procedures: ?NONE ? ? ?Follow-Up: ?At [facility], you and your health needs are our priority.  As part of our continuing mission to provide you with exceptional heart care, we have created designated Provider Care Teams.  These Care Teams include your primary Cardiologist (physician) and Advanced Practice Providers (APPs -  Physician Assistants and Nurse Practitioners) who all work together to provide you with the care you need, when you need it. ? ?Your next appointment:   ?6 month(s) ? ?The format for your next appointment:   ?In Person ? ?Provider:   ?Sinclair Grooms, MD   ? ? ?  ?

## 2021-11-23 NOTE — Progress Notes (Signed)
?Cardiology Office Note:   ? ?Date:  11/23/2021  ? ?ID:  MICHALE [name], DOB 1935-07-04, MRN [id number] ? ?PCP:  Plotnikov, Evie Lacks, MD ?  ?[facility] Providers ?Cardiologist:  Sinclair Grooms, MD ?Electrophysiologist:  Vickie Epley, MD    ? ?Referring MD: Cassandria Anger, MD  ? ?Chief Complaint: follow-up permanent a fib, hypertension ? ?History of Present Illness:   ? ?Hailey [name] is a very pleasant 87 y.o. female with a hx of HTN, permanent atrial fibrillation, aortic atherosclerosis, anemia, CAD s/p PCI DES of RCA 2018, and GERD.  ? ?She established care many years ago and was admitted 06/2014 with atypical chest pain. CT revealed 3 vessel CAD however cardiac enzymes and stress test were normal.  In October 2018 she underwent coronary catheterization which revealed severe diffuse three-vessel coronary calcification particularly in the RCA.  85 to 90% proximal followed by generalized 50 to 60% mid RCA calcified stenosis, distal left main 25%, heavily calcified.  Irregularities in the proximal and mid LAD up to 50%, heavily calcified, normal EF.  She underwent successful PCI with orbital atherectomy followed by stenting of heavily calcified RCA on 07/01/2017. Most recent echo revealed normal LVEF 65-70%, no rwma, mild LVH, G2 DD, moderately enlarged RV, moderate TRCardiac monitor 06/2021 revealed A-fib 100% of the time.  ?She has followed up consistently.  ? ?Last seen by Dr. Tamala Julian on 07/18/21 at which time spironolactone was decreased to initiate SGLT2 inhibitor therapy and she was referred for consideration of left atrial appendage device). She was advised to follow-up in 3-4 months.  ? ?She saw Dr. Quentin Ore on 08/01/2021 for consideration of Watchman (LAA occlusion device) and was felt to be a good candidate. She desired to discontinue anticoagulation due to frequent falls and skin tears. She was admitted 08/24/2021 for left atrial appendage occlusion device but unfortunately it was aborted  due to the orientation of the appendage and she was discharged home the same day. She called back the following day with concerns about pain in groin at site of catheter insertion. Ultrasound revealed no evidence of pseudoaneurysm, AVF, or DVT. ? ?She called our office on 11/08/2021 with concerns for spironolactone causing diarrhea.  The nurse advised her that she has been taking spironolactone 12.5 mg 3 days a week for some time and was not likely the culprit.  ? ?Overnight admission 11/19/21 for hypotension, lightheadedness. Reports BP was hovering around 100 and she was not feeling well, feeling draggy for a few days prior. Metoprolol and spironolactone were held during admission but she has since resumed. Had extensive lab work and followed-up with PCP on 11/22/21. He is starting iron and B12 for anemia.  ? ?Today, she is here with her husband for follow-up of her atrial fibrillation, hypertension, and CAD. Overall feeling pretty well with the exception of increased fatigue over the past several months. She previously reported GI distress that she associated with atorvastatin and was told by Dr. Tamala Julian that she could take it 3 days a week.  She has discontinued it due to frequently forgetting it.  Monitors home heart rate and blood pressure regularly.  Blood pressure has stabilized over the past 3 days since hospital discharge.  She is careful about her diet and hydrates regularly. Has not been as physically active over the past 3 or so months.  Reports leg swelling much better since starting spironolactone. She denies chest pain, shortness of breath, lower extremity edema, palpitations, melena, hematuria, hemoptysis, diaphoresis, weakness,  presyncope, syncope, orthopnea, and PND. Struggles with constipation.  ? ?Past Medical History:  ?Diagnosis Date  ? Acute blood loss anemia 12/25/2017  ? Allergic rhinitis   ? Anxiety   ? Arthritis   ? "my whole spine" (07/01/2017)  ? Atrial fibrillation ([place])   ? Bowel  obstruction ([place])   ? in [place]  ? Bradycardia with 41-50 beats per minute 12/27/2017  ? Cancer [facility])   ? Cellulitis of leg, right with large prepatella hematoma and open wounds 12/26/2017  ? CKD (chronic kidney disease) stage 2, GFR 60-89 ml/min   ? Colon polyps   ? Coronary artery disease   ? 10/18 PCI/DES to p/m LCx with cutting balloon to mLcx  ? Diverticulosis of colon   ? GERD (gastroesophageal reflux disease)   ? Hip bursitis 2010  ? Dr Para March, Post op seroma  ? History of colon polyps   ? HSV (herpes simplex virus) anogenital infection 07/2019  ? HTN (hypertension)   ? IBS (irritable bowel syndrome)   ? constipation predominant - Dr Earlean Shawl  ? Lichen sclerosus   ? Osteopenia 11/2016  ? T score -2.0 FRAX 15%/4.3%  ? PAC (premature atrial contraction)   ? Symptomatiic  ? Renal insufficiency   ? Right bundle branch block (RBBB) on electrocardiogram (ECG) 12/27/2017  ? Scoliosis   ? SVT (supraventricular tachycardia) ([place])   ? brief history  ? 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 biopsy showing vulvar atypia, possible VIN I  ? ? ?Past Surgical History:  ?Procedure Laterality Date  ? ANTERIOR AND POSTERIOR VAGINAL REPAIR  01/2002  ? Archie Endo 01/23/2011  ? APPENDECTOMY  1948  ? CARDIAC CATHETERIZATION  06/26/2017  ? CORONARY ANGIOPLASTY WITH STENT PLACEMENT  07/01/2017  ? CORONARY ATHERECTOMY N/A 07/01/2017  ? Procedure: CORONARY ATHERECTOMY;  Surgeon: Belva Crome, MD;  Location: [facility];  Service: Cardiovascular;  Laterality: N/A;  ? CORONARY STENT INTERVENTION N/A 07/01/2017  ? Procedure: CORONARY STENT INTERVENTION;  Surgeon: Belva Crome, MD;  Location: [facility];  Service: Cardiovascular;  Laterality: N/A;  ? HAMMER TOE SURGERY    ? HEMORRHOID BANDING    ? HIP SURGERY Left 04/2009  ? hip examination under anesthesia followed by greater trochanteric bursectomy; iliotibial band tenotomy/notes 01/20/2011  ? I & D EXTREMITY Right 01/10/2018  ? Procedure: IRRIGATION AND  DEBRIDEMENT RIGHT KNEE, APPLY WOUND VAC;  Surgeon: Newt Minion, MD;  Location: [facility];  Service: Orthopedics;  Laterality: Right;  ? KNEE BURSECTOMY Right 04/2009  ? Archie Endo 01/09/2011  ? LEFT ATRIAL APPENDAGE OCCLUSION N/A 08/24/2021  ? Procedure: LEFT ATRIAL APPENDAGE OCCLUSION;  Surgeon: Sherren Mocha, MD;  Location: [facility];  Service: Cardiovascular;  Laterality: N/A;  ? LEFT HEART CATH AND CORONARY ANGIOGRAPHY N/A 06/26/2017  ? Procedure: LEFT HEART CATH AND CORONARY ANGIOGRAPHY;  Surgeon: Belva Crome, MD;  Location: [facility];  Service: Cardiovascular;  Laterality: N/A;  ? PUBOVAGINAL SLING  01/2002  ? Archie Endo 01/23/2011  ? REDUCTION MAMMAPLASTY    ? TEE WITHOUT CARDIOVERSION N/A 08/24/2021  ? Procedure: TRANSESOPHAGEAL ECHOCARDIOGRAM (TEE);  Surgeon: Sherren Mocha, MD;  Location: [facility];  Service: Cardiovascular;  Laterality: N/A;  ? TEMPORARY PACEMAKER N/A 07/01/2017  ? Procedure: TEMPORARY PACEMAKER;  Surgeon: Belva Crome, MD;  Location: [facility];  Service: Cardiovascular;  Laterality: N/A;  ? VAGINAL HYSTERECTOMY  01/2002  ? Vaginal hysterectomy, bilateral salpingo-oophorectomy/notes 01/23/2011  ? ? ?  Current Medications: ?Current Meds  ?Medication Sig  ? acetaminophen (TYLENOL) 500 MG tablet Take 1,000 mg by mouth every 6 (six) hours as needed (body aches / sore hip). Maximum of 6 tablets daily ('3000mg'$ )  ? amoxicillin-clavulanate (AUGMENTIN) 875-125 MG tablet Take 1 tablet by mouth 2 (two) times daily.  ? BIOTIN PO Take 5,000 mcg by mouth in the morning.  ? Cholecalciferol (VITAMIN D3) 50 MCG (2000 UT) capsule Take 1 capsule (2,000 Units total) by mouth daily.  ? clobetasol ointment (TEMOVATE) 8.88 % Apply 1 application topically to affected area twice daily for 2 weeks and then reduce to twice a week at night.  ? Cyanocobalamin (VITAMIN B-12) 1000 MCG SUBL Place 1 tablet (1,000 mcg total) under the tongue daily.  ? diclofenac Sodium (VOLTAREN) 1 % GEL Apply  2 g topically 4 (four) times daily as needed (back pain.).  ? dicyclomine (BENTYL) 20 MG tablet Take 1 by mouth every 4-6 hours as needed for cramping  ? DULoxetine (CYMBALTA) 60 MG capsule Take 1 capsule (60 mg total)

## 2021-11-24 LAB — CULTURE, BLOOD (ROUTINE X 2)
Culture: NO GROWTH
Culture: NO GROWTH

## 2021-11-26 ENCOUNTER — Encounter: Payer: Self-pay | Admitting: Internal Medicine

## 2021-11-27 ENCOUNTER — Other Ambulatory Visit: Payer: Self-pay

## 2021-11-27 ENCOUNTER — Ambulatory Visit
Admission: RE | Admit: 2021-11-27 | Discharge: 2021-11-27 | Disposition: A | Discharge status: Home / Self Care | Payer: Medicare Other | Source: Ambulatory Visit | Attending: Physical Medicine and Rehabilitation | Admitting: Physical Medicine and Rehabilitation

## 2021-11-27 DIAGNOSIS — M47816 Spondylosis without myelopathy or radiculopathy, lumbar region: Secondary | ICD-10-CM | POA: Diagnosis not present

## 2021-11-27 DIAGNOSIS — M48061 Spinal stenosis, lumbar region without neurogenic claudication: Secondary | ICD-10-CM | POA: Diagnosis not present

## 2021-11-27 DIAGNOSIS — M545 Low back pain, unspecified: Secondary | ICD-10-CM | POA: Diagnosis not present

## 2021-11-29 ENCOUNTER — Other Ambulatory Visit: Payer: Medicare Other | Admitting: *Deleted

## 2021-11-29 ENCOUNTER — Other Ambulatory Visit: Payer: Self-pay

## 2021-11-29 DIAGNOSIS — E785 Hyperlipidemia, unspecified: Secondary | ICD-10-CM

## 2021-11-29 LAB — LIPID PANEL
Chol/HDL Ratio: 2.1 ratio (ref 0.0–4.4)
Cholesterol, Total: 178 mg/dL (ref 100–199)
HDL: 86 mg/dL (ref >39)
LDL Chol Calc (NIH): 77 mg/dL (ref 0–99)
Triglycerides: 84 mg/dL (ref 0–149)
VLDL Cholesterol Cal: 15 mg/dL (ref 5–40)

## 2021-12-04 ENCOUNTER — Telehealth: Payer: Self-pay | Admitting: Interventional Cardiology

## 2021-12-04 ENCOUNTER — Telehealth: Payer: Self-pay | Admitting: *Deleted

## 2021-12-04 ENCOUNTER — Telehealth: Payer: Self-pay | Admitting: Physical Medicine and Rehabilitation

## 2021-12-04 DIAGNOSIS — E785 Hyperlipidemia, unspecified: Secondary | ICD-10-CM

## 2021-12-04 MED ORDER — ATORVASTATIN CALCIUM 40 MG PO TABS: 40.0000 mg | ORAL_TABLET | ORAL | 3 refills | Status: DC

## 2021-12-04 NOTE — Telephone Encounter (Signed)
Spoke with pt and reviewed medications. ? ?Pt aware to take Spironolactone 12.'5mg'$  MWF as previously reported and take Jardiance '10mg'$  QD.  Reviewed recent cholesterol labs and reviewed recommendations. Pt will try taking Atorvastatin '40mg'$  MWF and see how she does.  Pt aware if any issues, contact the office and we will switch her to the Rosuvastatin that Christen Bame, NP recommended.  Pt will come for labs on 01/29/22.   ?

## 2021-12-04 NOTE — Telephone Encounter (Signed)
Patient informed, message sent to appointments to schedule.  

## 2021-12-04 NOTE — Telephone Encounter (Signed)
Patient called had vulvar biopsy on 11/16/21 report the biopsy area is still sore. She is using clobetasol ointment 0.05% as directed asked if this is normal. Patient would like recommendations. Please advise  ?

## 2021-12-04 NOTE — Telephone Encounter (Signed)
Patient returned call asked for a call back. # [phone] ?

## 2021-12-04 NOTE — Telephone Encounter (Signed)
Pt c/o medication issue: ? ?1. Name of Medication:  ? empagliflozin (JARDIANCE) 10 MG TABS tablet  ? ?spironolactone (ALDACTONE) 25 MG tablet ?Litpitor ?2. How are you currently taking this medication (dosage and times per day)?  ? ?3. Are you having a reaction (difficulty breathing--STAT)? No ? ?4. What is your medication issue? Pt states that she need to know the dosage amount that she is suppose to take ?

## 2021-12-04 NOTE — Telephone Encounter (Signed)
Please offer office visit with me.  ?

## 2021-12-05 NOTE — Telephone Encounter (Signed)
Per appointments "patient requesting if we do ask silva where to work her in on Thursday she can only do the AM and Thursday after 2:30pm. No openings for Thursday, please advise if work in this day.  ? ?Please advise  ?

## 2021-12-05 NOTE — Telephone Encounter (Signed)
Could she come in on 12/06/21 at 1:15 pm? ?

## 2021-12-06 ENCOUNTER — Other Ambulatory Visit: Payer: Self-pay

## 2021-12-06 ENCOUNTER — Encounter: Payer: Self-pay | Admitting: Internal Medicine

## 2021-12-06 ENCOUNTER — Ambulatory Visit: Payer: Medicare Other | Admitting: Physical Medicine and Rehabilitation

## 2021-12-06 ENCOUNTER — Encounter: Payer: Self-pay | Admitting: Physical Medicine and Rehabilitation

## 2021-12-06 ENCOUNTER — Encounter: Payer: Self-pay | Admitting: Obstetrics and Gynecology

## 2021-12-06 ENCOUNTER — Ambulatory Visit: Payer: Medicare Other | Admitting: Obstetrics and Gynecology

## 2021-12-06 ENCOUNTER — Ambulatory Visit: Payer: Medicare Other | Admitting: Internal Medicine

## 2021-12-06 VITALS — BP 104/72 | HR 62 | Ht 63.5 in | Wt 138.0 lb

## 2021-12-06 VITALS — BP 93/55 | HR 84

## 2021-12-06 VITALS — BP 130/84 | Ht 62.75 in | Wt 143.0 lb

## 2021-12-06 DIAGNOSIS — M5416 Radiculopathy, lumbar region: Secondary | ICD-10-CM

## 2021-12-06 DIAGNOSIS — M5441 Lumbago with sciatica, right side: Secondary | ICD-10-CM

## 2021-12-06 DIAGNOSIS — K589 Irritable bowel syndrome without diarrhea: Secondary | ICD-10-CM | POA: Diagnosis not present

## 2021-12-06 DIAGNOSIS — R1030 Lower abdominal pain, unspecified: Secondary | ICD-10-CM

## 2021-12-06 DIAGNOSIS — G8929 Other chronic pain: Secondary | ICD-10-CM

## 2021-12-06 DIAGNOSIS — N763 Subacute and chronic vulvitis: Secondary | ICD-10-CM

## 2021-12-06 DIAGNOSIS — M48061 Spinal stenosis, lumbar region without neurogenic claudication: Secondary | ICD-10-CM | POA: Diagnosis not present

## 2021-12-06 DIAGNOSIS — K5792 Diverticulitis of intestine, part unspecified, without perforation or abscess without bleeding: Secondary | ICD-10-CM

## 2021-12-06 DIAGNOSIS — K219 Gastro-esophageal reflux disease without esophagitis: Secondary | ICD-10-CM | POA: Diagnosis not present

## 2021-12-06 DIAGNOSIS — M419 Scoliosis, unspecified: Secondary | ICD-10-CM

## 2021-12-06 DIAGNOSIS — B379 Candidiasis, unspecified: Secondary | ICD-10-CM

## 2021-12-06 LAB — WET PREP FOR TRICH, YEAST, CLUE

## 2021-12-06 MED ORDER — TERCONAZOLE 0.4 % VA CREA: 1.0000 | TOPICAL_CREAM | Freq: Every day | VAGINAL | 0 refills | Status: DC

## 2021-12-06 NOTE — Telephone Encounter (Signed)
She come come in now, but I expect she will wait for the appointment.  ?

## 2021-12-06 NOTE — Progress Notes (Signed)
HISTORY OF PRESENT ILLNESS: ? ?Hailey [name] is a 87 y.o. female who presents today for follow-up after being diagnosed with diverticulitis.  She completed a course of Augmentin.  She tells me that the antibiotic did not agree with her system.  Issues with nausea and diarrhea.  She has completed the antibiotic.  Her abdominal pain has resolved.  She continues with constipation.  She has been using MiraLAX which works Recruitment consultant.  She has questions regarding Bentyl, Levsin, and MiraLAX.  She feels that if she needs an antibiotic in the future it should be something other than Augmentin.  She is accompanied today by her husband.  She has a number of excellent questions regarding her conditions.  She tells me that her primary care provider diagnosed with iron deficiency anemia for which she is on iron.  She is anticipating receiving an iron infusion at the cancer center.  She did have testing for celiac disease 2 years ago.  This was negative. ? ?REVIEW OF SYSTEMS: ? ?All non-GI ROS negative unless otherwise stated in the HPI except for anxiety, arthritis, back pain, fatigue, heart rhythm change, muscle cramps, shortness of breath ? ?Past Medical History:  ?Diagnosis Date  ? Acute blood loss anemia 12/25/2017  ? Allergic rhinitis   ? Anxiety   ? Arthritis   ? "my whole spine" (07/01/2017)  ? Atrial fibrillation ([place])   ? Bowel obstruction ([place])   ? in [place]  ? Bradycardia with 41-50 beats per minute 12/27/2017  ? Cancer [facility])   ? Cellulitis of leg, right with large prepatella hematoma and open wounds 12/26/2017  ? CKD (chronic kidney disease) stage 2, GFR 60-89 ml/min   ? Colon polyps   ? Coronary artery disease   ? 10/18 PCI/DES to p/m LCx with cutting balloon to mLcx  ? Diverticulosis of colon   ? GERD (gastroesophageal reflux disease)   ? Hip bursitis 2010  ? Dr Para March, Post op seroma  ? History of colon polyps   ? HSV (herpes simplex virus) anogenital infection 07/2019  ? HTN (hypertension)   ? IBS (irritable  bowel syndrome)   ? constipation predominant - Dr Earlean Shawl  ? Lichen sclerosus   ? Osteopenia 11/2016  ? T score -2.0 FRAX 15%/4.3%  ? PAC (premature atrial contraction)   ? Symptomatiic  ? Renal insufficiency   ? Right bundle branch block (RBBB) on electrocardiogram (ECG) 12/27/2017  ? Scoliosis   ? SVT (supraventricular tachycardia) ([place])   ? brief history  ? 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 biopsy showing vulvar atypia, possible VIN I  ? ? ?Past Surgical History:  ?Procedure Laterality Date  ? ANTERIOR AND POSTERIOR VAGINAL REPAIR  01/2002  ? Archie Endo 01/23/2011  ? APPENDECTOMY  1948  ? CARDIAC CATHETERIZATION  06/26/2017  ? CORONARY ANGIOPLASTY WITH STENT PLACEMENT  07/01/2017  ? CORONARY ATHERECTOMY N/A 07/01/2017  ? Procedure: CORONARY ATHERECTOMY;  Surgeon: Belva Crome, MD;  Location: [place] [facility];  Service: Cardiovascular;  Laterality: N/A;  ? CORONARY STENT INTERVENTION N/A 07/01/2017  ? Procedure: CORONARY STENT INTERVENTION;  Surgeon: Belva Crome, MD;  Location: [place] [facility];  Service: Cardiovascular;  Laterality: N/A;  ? HAMMER TOE SURGERY    ? HEMORRHOID BANDING    ? HIP SURGERY Left 04/2009  ? hip examination under anesthesia followed by greater trochanteric bursectomy; iliotibial band tenotomy/notes 01/20/2011  ? I & D EXTREMITY Right 01/10/2018  ? Procedure: IRRIGATION AND DEBRIDEMENT RIGHT KNEE,  APPLY WOUND VAC;  Surgeon: Newt Minion, MD;  Location: [place];  Service: Orthopedics;  Laterality: Right;  ? KNEE BURSECTOMY Right 04/2009  ? Archie Endo 01/09/2011  ? LEFT ATRIAL APPENDAGE OCCLUSION N/A 08/24/2021  ? Procedure: LEFT ATRIAL APPENDAGE OCCLUSION;  Surgeon: Sherren Mocha, MD;  Location: [place] [facility];  Service: Cardiovascular;  Laterality: N/A;  ? LEFT HEART CATH AND CORONARY ANGIOGRAPHY N/A 06/26/2017  ? Procedure: LEFT HEART CATH AND CORONARY ANGIOGRAPHY;  Surgeon: Belva Crome, MD;  Location: [place] [facility];  Service: Cardiovascular;  Laterality: N/A;   ? PUBOVAGINAL SLING  01/2002  ? Archie Endo 01/23/2011  ? REDUCTION MAMMAPLASTY    ? TEE WITHOUT CARDIOVERSION N/A 08/24/2021  ? Procedure: TRANSESOPHAGEAL ECHOCARDIOGRAM (TEE);  Surgeon: Sherren Mocha, MD;  Location: [place] [facility];  Service: Cardiovascular;  Laterality: N/A;  ? TEMPORARY PACEMAKER N/A 07/01/2017  ? Procedure: TEMPORARY PACEMAKER;  Surgeon: Belva Crome, MD;  Location: [place] [facility];  Service: Cardiovascular;  Laterality: N/A;  ? VAGINAL HYSTERECTOMY  01/2002  ? Vaginal hysterectomy, bilateral salpingo-oophorectomy/notes 01/23/2011  ? ? ?Social History ?Hailey [name]  reports that she quit smoking about 42 years ago. Her smoking use included cigarettes. She has a 7.00 pack-year smoking history. She has never used smokeless tobacco. She reports current alcohol use. She reports that she does not use drugs. ? ?family history includes Colon cancer in her mother; Diabetes in her father; Pancreatic cancer in her brother; Prostate cancer in her brother and father; Stomach cancer in her son. ? ?Allergies  ?Allergen Reactions  ? Macrobid [employer] Macro] Other (See Comments)  ?  Nausea, stomach cramps, fatigue , headache.  ? Meloxicam Other (See Comments)  ?  Jittery and headache  ? Digoxin And Related   ?  headaches  ? Ferrous Sulfate   ?  Bad constipation ?  ? Keflex [Cephalexin] Nausea And Vomiting  ?  Nausea, fatigue, and headache.  ? Mobic [Meloxicam] Other (See Comments)  ?  Unsure of reaction type  ? Sulfamethoxazole-Trimethoprim Nausea Only  ? ? ?  ? ?PHYSICAL EXAMINATION: ?Vital signs: BP 104/72   Pulse 62   Ht 5' 3.5" (1.613 m)   Wt 138 lb (62.6 kg)   BMI 24.06 kg/m?   ?Constitutional: generally well-appearing, no acute distress ?Psychiatric: alert and oriented x3, cooperative ?Eyes: extraocular movements intact, anicteric, conjunctiva pink ?Mouth: oral pharynx moist, no lesions ?Neck: supple no lymphadenopathy ?Cardiovascular: heart regular rate and rhythm, no  murmur ?Lungs: clear to auscultation bilaterally ?Abdomen: soft, nontender, nondistended, no obvious ascites, no peritoneal signs, normal bowel sounds, no organomegaly ?Rectal: Omitted ?Extremities: no clubbing or cyanosis.  Trace lower extremity edema bilaterally ?Skin: no lesions on visible extremities save ecchymoses ?Neuro: No focal deficits.  Cranial nerves intact ? ?ASSESSMENT: ? ?1.  Diverticulitis.  Resolved ?2.  Irritable bowel syndrome ?3.  Chronic constipation ?4.  General medical problems ? ? ?PLAN: ? ?1.  High-fiber diet ?2.  Okay to use MiraLAX daily.  Titrate to need.  We discussed this ?3.  We also discussed the difference between the antispasmodics dicyclomine and hyoscyamine. ?4.  GI follow-up as needed ? ? ? ? ?  ?

## 2021-12-06 NOTE — Patient Instructions (Signed)
If you are age 85 or older, your body mass index should be between 23-30. Your Body mass index is 24.06 kg/m?Marland Kitchen If this is out of the aforementioned range listed, please consider follow up with your Primary Care Provider. ? ?If you are age 73 or younger, your body mass index should be between 19-25. Your Body mass index is 24.06 kg/m?Marland Kitchen If this is out of the aformentioned range listed, please consider follow up with your Primary Care Provider.  ? ?________________________________________________________ ? ?The [place] [facility] providers would like to encourage you to use [facility] to communicate with providers for non-urgent requests or questions.  Due to long hold times on the telephone, sending your provider a message by [facility] may be a faster and more efficient way to get a response.  Please allow 48 business hours for a response.  Please remember that this is for non-urgent requests.  ?_______________________________________________________ ? ?Please follow up as needed ?

## 2021-12-06 NOTE — Patient Instructions (Signed)
Vaginal Yeast Infection, Adult ?Vaginal yeast infection is a condition that causes vaginal discharge as well as soreness, swelling, and redness (inflammation) of the vagina. This is a common condition. Some women get this infection frequently. ?What are the causes? ?This condition is caused by a change in the normal balance of the yeast (Candida) and normal bacteria that live in the vagina. This change causes an overgrowth of yeast, which causes the inflammation. ?What increases the risk? ?The condition is more likely to develop in women who: ?Take antibiotic medicines. ?Have diabetes. ?Take birth control pills. ?Are pregnant. ?Douche often. ?Have a weak body defense system (immune system). ?Have been taking steroid medicines for a long time. ?Frequently wear tight clothing. ?What are the signs or symptoms? ?Symptoms of this condition include: ?White, thick, creamy vaginal discharge. ?Swelling, itching, redness, and irritation of the vagina. The lips of the vagina (labia) may be affected as well. ?Pain or a burning feeling while urinating. ?Pain during sex. ?How is this diagnosed? ?This condition is diagnosed based on: ?Your medical history. ?A physical exam. ?A pelvic exam. Your health care provider will examine a sample of your vaginal discharge under a microscope. Your health care provider may send this sample for testing to confirm the diagnosis. ?How is this treated? ?This condition is treated with medicine. Medicines may be over-the-counter or prescription. You may be told to use one or more of the following: ?Medicine that is taken by mouth (orally). ?Medicine that is applied as a cream (topically). ?Medicine that is inserted directly into the vagina (suppository). ?Follow these instructions at home: ?Take or apply over-the-counter and prescription medicines only as told by your health care provider. ?Do not use tampons until your health care provider approves. ?Do not have sex until your infection has  cleared. Sex can prolong or worsen your symptoms of infection. Ask your health care provider when it is safe to resume sexual activity. ?Keep all follow-up visits. This is important. ?How is this prevented? ? ?Do not wear tight clothes, such as pantyhose or tight pants. ?Wear breathable cotton underwear. ?Do not use douches, perfumed soap, creams, or powders. ?Wipe from front to back after using the toilet. ?If you have diabetes, keep your blood sugar levels under control. ?Ask your health care provider for other ways to prevent yeast infections. ?Contact a health care provider if: ?You have a fever. ?Your symptoms go away and then return. ?Your symptoms do not get better with treatment. ?Your symptoms get worse. ?You have new symptoms. ?You develop blisters in or around your vagina. ?You have blood coming from your vagina and it is not your menstrual period. ?You develop pain in your abdomen. ?Summary ?Vaginal yeast infection is a condition that causes discharge as well as soreness, swelling, and redness (inflammation) of the vagina. ?This condition is treated with medicine. Medicines may be over-the-counter or prescription. ?Take or apply over-the-counter and prescription medicines only as told by your health care provider. ?Do not douche. Resume sexual activity or use of tampons as instructed by your health care provider. ?Contact a health care provider if your symptoms do not get better with treatment or your symptoms go away and then return. ?This information is not intended to replace advice given to you by your health care provider. Make sure you discuss any questions you have with your health care provider. ?Document Revised: 11/14/2020 Document Reviewed: 11/14/2020 ?Elsevier Patient Education ? [place]. ? ?

## 2021-12-06 NOTE — Telephone Encounter (Signed)
Patient said she can't come at this time. She has GI appointment at 2:00 pm. She can come today this am after her 10:15am appointment. Maybe around 11:00am or 11:15am. ?

## 2021-12-06 NOTE — Patient Instructions (Signed)
Dr. Stuart McGill  "The Big Three" that will safely increase your endurance and protect your back: modified curl-up, side bridge, and bird dog.  1. Modified Curl-Up Lie your back with one knee bent and one knee straight, this puts your pelvis in a neutral position and the muscles of your core in an optimal alignment of pull to avoid strain on the low back. Place your hands under the arch of your low back and ensure that this arch is maintained throughout the curl-up. Start by bracing your abdomen; this is different from flexing your abs, bear down through your belly. Now make sure you can take a breath in and a breath out while maintaining this brace. If you cannot, stop there and practice doing just that until you've got it mastered! Now, pretend that your spine in your neck and your upper back are cemented together and do not move independently. Pick a spot on the ceiling and focus your gaze there, lift your shoulder blades about 30 off the floor and slowly return to the start position. Take note of your neck, and ensure that your chin isn't poking forward when you do a curl up. If you're struggling with that, focus on making a double chin. Perform 3 sets of 10-12.  2. Side Bridge Lie on your side and prop yourself up on your elbow. Ensure that your elbow is directly under your shoulder to avoid any unnecessary strain through your shoulder joint. With your legs straight, place your top foot on the ground in front of your bottom foot. Place your top hand on your bottom shoulder. While maintaining the natural curve of your spine, that is to say, be sure that your upper body isn't twisted or leaning forward, brace your abdomen, squeeze through your gluteals (clench your bum), and lift your hips up off the ground. Don't forget to breathe! Hold for 8-10 seconds, repeat 3 times. As the exercise becomes easier, increase the number of repetitions as opposed to the length of time. There are a number of ways to  modify this exercise in order to increase or decrease the difficulty such as the example below on the right. If it's not challenging enough, try putting that top hand on your top hip, or straight up in the air, but again, be sure your body stays straight!  3. Bird Dog Start on your hands and knees with your hands shoulder width apart directly under your shoulders, and knees hip width apart directly under your hips. Maintain a neutral spine. Brace through your abdomen and squeeze your gluteals. Ensure you can maintain this while you take a breath in and out. Lift your right arm in front until it's level with your shoulder, squeezing the muscles between your shoulder blades as you do so. At the same time, extend your left leg straight back until it is level with your hips, squeezing your gluteals, and keeping your hips square to the floor. Return to the starting position in a slow and controlled manner, and perform the same action with the left arm and right leg. That is one repetition. Perform 3 sets of 8-10 repetitions. As this exercise becomes easy, focus on co-contracting the muscles of your forearm and arms while you extend, the same goes for the muscles of your legs. For an additional challenge, instead of putting your hand and knee back down on the ground between reps, try just sweeping the floor and performing the next rep right away, or draw a square with your   arm and leg and then sweep the floor.  *Avoid exercises that forward flex the spine or extend the spine. 

## 2021-12-06 NOTE — Telephone Encounter (Signed)
Patient aware she is leaving from another appointment and will be here at 11:15am as a work in. ?

## 2021-12-06 NOTE — Progress Notes (Signed)
? ?[name] - 87 y.o. female MRN [id number]  Date of birth: 25-May-1935 ? ?Office Visit Note: ?Visit Date: 12/06/2021 ?PCP: Plotnikov, Evie Lacks, MD ?Referred by: Cassandria Anger, MD ? ?Subjective: ?Chief Complaint  ?Patient presents with  ? Lower Back - Pain  ? Right Leg - Pain  ? ?HPI: Hailey [name] is a 87 y.o. female who comes in today for evaluation of chronic bilateral lower back pain radiating to right lateral leg down to knee. Patient reports pain has been ongoing for several years and is exacerbated by walking and activity, describes as constant soreness, currently rates as 4 out of 10. Patient reports some relief of pain with home exercise regimen, rest and use of medications. Patients recent lumbar MRI exhibits scoliotic thoracolumbar curvature, multi-level facet arthropathy, mild-moderate canal stenosis with moderate right foraminal stenosis at L3-L4 and mild canal stenosis and moderate bilateral foraminal stenosis at L2-L3. No high grade spinal canal stenosis noted. Patient had right L5 transforaminal epidural steroid injection on 10/03/2021 and reports some relief of pain for approximately 2 weeks. Patient states her pain has significantly subsided, however she does continue to have pain with walking and activity. Patient states she is traveling to [facility] in May for her sons wedding and is concerned she will need an injection prior to her trip. Patient denies focal weakness, numbness and tingling. Patient denies recent trauma or falls.  ? ?Review of Systems  ?Musculoskeletal:  Positive for back pain.  ?Neurological:  Negative for tingling, sensory change, focal weakness and weakness.  ?All other systems reviewed and are negative. Otherwise per HPI. ? ?Assessment & Plan: ?Visit Diagnoses:  ?  ICD-10-CM   ?1. Lumbar radiculopathy  M54.16   ?  ?2. Chronic bilateral low back pain with right-sided sciatica  M54.41   ? G89.29   ?  ?3. Foraminal stenosis of lumbar region  M48.061   ?  ?4.  Scoliosis of lumbar spine, unspecified scoliosis type  M41.9   ?  ?   ?Plan: Findings:  ?Chronic bilateral lower back pain radiating to right lateral leg down to knee. Patients pain has  improved since our last office visit in February. She does continue to manage pain at home with exercise regimen, rest and use of medications. Patients recent lumbar MRI reviewed today using images and spine model. Lumbar MRI is essentially unchanged from previous imaging in 2020. Patients clinical presentation and exam are consistent with L5 nerve pattern. We believe the next step is to continue to monitor, she is instructed to let us know if her pain worsens or changes in nature. We would consider repeating lumbar epidural steroid injection as needed if her pain worsens. Patient encouraged to remain active and to continue home exercise regimen as tolerated. No red flag symptoms noted upon exam today. 25 minutes spent with patient discussing diagnosis, management and treatment plan.  ? ?Meds & Orders: No orders of the defined types were placed in this encounter. ? No orders of the defined types were placed in this encounter. ?  ?Follow-up: Return if symptoms worsen or fail to improve.  ? ?Procedures: ?No procedures performed  ?   ? ?Clinical History: ?EXAM: ?MRI LUMBAR SPINE WITHOUT CONTRAST ?  ?TECHNIQUE: ?Multiplanar, multisequence MR imaging of the lumbar spine was ?performed. No intravenous contrast was administered. ?  ?COMPARISON:  MR lumbar 03/20/2017; X-ray lumbar 09/14/2021. ?  ?FINDINGS: ?Segmentation:  Standard. ?  ?Alignment: Scoliotic thoracolumbar curvature. Mild retrolisthesis at ?L1-2, L2-3, and L3-4. ?  ?  Vertebrae: No fracture, evidence of discitis, or suspicious bone ?lesion. Scattered intraosseous hemangiomas. ?  ?Conus medullaris and cauda equina: Conus extends to the L1 level. ?Conus and cauda equina appear normal. ?  ?Paraspinal and other soft tissues: No acute findings. ?  ?Disc levels: ?  ?T12-L1: Mild disc  bulge. No foraminal or canal stenosis. Unchanged. ?  ?L1-L2: Disc height loss with mild disc bulge and endplate ridging. ?Mild left foraminal stenosis. No canal stenosis. Unchanged. ?  ?L2-L3: Retrolisthesis. Diffuse disc bulge with small caudally ?extending right paracentral disc extrusion. Mild bilateral facet ?arthropathy. Mild canal stenosis with moderate bilateral foraminal ?stenosis. Findings have progressed from prior. ?  ?L3-L4: Diffuse disc bulge with mild bilateral facet arthropathy and ?ligamentum flavum buckling. Findings result in mild-moderate canal ?stenosis with moderate right foraminal stenosis. Findings progressed ?from prior. ?  ?L4-L5: Disc bulge with endplate osteophytic ridging. Advanced ?bilateral facet arthropathy. Severe right and mild left foraminal ?stenosis. No significant interval progression. ?  ?L5-S1: Disc height loss and endplate ridging. Mild facet ?hypertrophy. No canal stenosis. Mild-to-moderate left and mild right ?foraminal stenosis. Unchanged. ?  ?IMPRESSION: ?1. Multilevel lumbar spondylosis, slightly progressed from prior ?MRI. ?2. Mild-moderate canal stenosis with moderate right foraminal ?stenosis at L3-4. ?3. Mild canal stenosis and moderate bilateral foraminal stenosis at ?L2-3. ?4. Severe right and mild left foraminal stenosis at L4-5. ?  ?  ?Electronically Signed ?  By: Davina Poke D.O. ?  On: 11/29/2021 13:23  ? ?She reports that she quit smoking about 42 years ago. Her smoking use included cigarettes. She has a 7.00 pack-year smoking history. She has never used smokeless tobacco. No results for input(s): HGBA1C, LABURIC in the last 8760 hours. ? ?Objective:  VS:  HT:    WT:   BMI:     BP:(!) 93/55  HR:84bpm  TEMP: ( )  RESP:  ?Physical Exam ?Vitals and nursing note reviewed.  ?HENT:  ?   Head: Normocephalic and atraumatic.  ?   Right Ear: External ear normal.  ?   Left Ear: External ear normal.  ?   Nose: Nose normal.  ?   Mouth/Throat:  ?   Mouth: Mucous  membranes are moist.  ?Eyes:  ?   Extraocular Movements: Extraocular movements intact.  ?Cardiovascular:  ?   Rate and Rhythm: Normal rate.  ?   Pulses: Normal pulses.  ?Pulmonary:  ?   Effort: Pulmonary effort is normal.  ?Abdominal:  ?   General: Abdomen is flat. There is no distension.  ?Musculoskeletal:     ?   General: Tenderness present.  ?   Cervical back: Normal range of motion.  ?   Comments: Pt rises from seated position to standing without difficulty. Good lumbar range of motion. Strong distal strength without clonus, no pain upon palpation of greater trochanters. Dysesthesias noted to right L5 dermatome. Sensation intact bilaterally. Walks independently, gait steady.   ?Skin: ?   General: Skin is warm and dry.  ?   Capillary Refill: Capillary refill takes less than 2 seconds.  ?Neurological:  ?   General: No focal deficit present.  ?   Mental Status: She is alert and oriented to person, place, and time.  ?Psychiatric:     ?   Mood and Affect: Mood normal.     ?   Behavior: Behavior normal.  ?  ?Ortho Exam ? ?Imaging: ?No results found. ? ?Past Medical/Family/Surgical/Social History: ?Medications & Allergies reviewed per EMR, new medications updated. ?Patient Active Problem List  ?  Diagnosis Date Noted  ? Herpesviral infection, unspecified 11/22/2021  ? Skin ulcer, limited to breakdown of skin ([place]) 11/22/2021  ? Anemia, iron deficiency 11/22/2021  ? Low serum vitamin B12 11/22/2021  ? Acute renal failure superimposed on stage 2 chronic kidney disease ([place]) 11/20/2021  ? Anemia 11/20/2021  ? UTI (urinary tract infection) 11/20/2021  ? Acute diverticulitis 11/20/2021  ? Hypotension 11/19/2021  ? Shortness of breath 06/12/2021  ? Right leg pain 02/21/2021  ? Diverticulitis 01/08/2021  ? Acute respiratory failure with hypoxia ([place])   ? Bacterial colitis   ? Abnormal CT of the abdomen   ? Diarrhea of infectious origin   ? Acute colitis 08/27/2020  ? Aortic atherosclerosis ([place]) 08/27/2020  ? Acute prerenal  azotemia 08/27/2020  ? Shock circulatory ([place]) 08/27/2020  ? Metabolic acidosis with normal anion gap and bicarbonate losses 08/27/2020  ? Dehydration   ? Squamous cell cancer of skin of left cheek 07/04/2020  ? Lic

## 2021-12-06 NOTE — Progress Notes (Signed)
Pt state lower back pain that travels down her right leg. Pt state walking, standing and sitting makes the pain worse. Pt state she takes over the counter pain meds to help ease her pain. ? ?Numeric Pain Rating Scale and Functional Assessment ?Average Pain 10 ?Pain Right Now 4 ?My pain is intermittent, dull, stabbing, and aching ?Pain is worse with: walking, sitting, standing, and some activites ?Pain improves with: medication ? ? ?In the last MONTH (on 0-10 scale) has pain interfered with the following? ? ?1. General activity like being  able to carry out your everyday physical activities such as walking, climbing stairs, carrying groceries, or moving a chair?  ?Rating(6) ? ?2. Relation with others like being able to carry out your usual social activities and roles such as  activities at home, at work and in your community. ?Rating(7) ? ?3. Enjoyment of life such that you have  been bothered by emotional problems such as feeling anxious, depressed or irritable?  ?Rating(8) ? ?

## 2021-12-06 NOTE — Progress Notes (Signed)
GYNECOLOGY  VISIT ?  ?HPI: ?87 y.o.   Married  Caucasian  female   ?[id number] with No LMP recorded. Patient has had a hysterectomy.   ?here for vulvar tenderness at biopsy site.  Result showed spongiosis.  No VIN.  ?The area is painful.  ?Woke  her up last night.  ?The area feels dry.  ?No drainage.  ?No fevers.  ? ?Went to the ER with syncope.  ?She was dx with diverticulitis.  ?She took abx and developed vomiting and diarrhea.  ? ?Using clobetasol, and this relieves the itching and the burning.  ? ?GYNECOLOGIC HISTORY: ?No LMP recorded. Patient has had a hysterectomy. ?Contraception:  hyst ?Menopausal hormone therapy:  none ?Last mammogram:   01-18-21 3D/Neg/BiRads1 ?Last pap smear:   Years ago ?       ?OB History   ? ? Gravida  ?2  ? Para  ?2  ? Term  ?2  ? Preterm  ?   ? AB  ?   ? Living  ?1  ?  ? ? SAB  ?   ? IAB  ?   ? Ectopic  ?   ? Multiple  ?   ? Live Births  ?   ?   ?  ?  ?    ? ?Patient Active Problem List  ? Diagnosis Date Noted  ? Herpesviral infection, unspecified 11/22/2021  ? Skin ulcer, limited to breakdown of skin ([place]) 11/22/2021  ? Anemia, iron deficiency 11/22/2021  ? Low serum vitamin B12 11/22/2021  ? Acute renal failure superimposed on stage 2 chronic kidney disease ([place]) 11/20/2021  ? Anemia 11/20/2021  ? UTI (urinary tract infection) 11/20/2021  ? Acute diverticulitis 11/20/2021  ? Hypotension 11/19/2021  ? Shortness of breath 06/12/2021  ? Right leg pain 02/21/2021  ? Diverticulitis 01/08/2021  ? Acute respiratory failure with hypoxia ([place])   ? Bacterial colitis   ? Abnormal CT of the abdomen   ? Diarrhea of infectious origin   ? Acute colitis 08/27/2020  ? Aortic atherosclerosis ([place]) 08/27/2020  ? Acute prerenal azotemia 08/27/2020  ? Shock circulatory ([place]) 08/27/2020  ? Metabolic acidosis with normal anion gap and bicarbonate losses 08/27/2020  ? Dehydration   ? Squamous cell cancer of skin of left cheek 07/04/2020  ? Lichen sclerosus 50/27/7412  ? Abdominal cramping 03/16/2020  ?  Neurogenic orthostatic hypotension ([place]) 09/16/2019  ? Injury of face 08/31/2019  ? Leg abrasion, infected, right, subsequent encounter 08/28/2019  ? Face lacerations 08/26/2019  ? Nose fracture 08/26/2019  ? Concussion 08/26/2019  ? Wrist pain, acute, right 08/26/2019  ? Greater trochanteric pain syndrome of right lower extremity 07/30/2019  ? Viral URI with cough 08/15/2018  ? Abdominal pain 05/22/2018  ? Traumatic hematoma of right knee 01/10/2018  ? Ulcer of right knee ([place])   ? Bradycardia with 41-50 beats per minute 12/27/2017  ? Right bundle branch block (RBBB) on electrocardiogram (ECG) 12/27/2017  ? Quadriceps tendon rupture 12/24/2017  ? CKD (chronic kidney disease), stage II 12/24/2017  ? Cellulitis of leg, right with large prepatella hematoma and open wounds 12/24/2017  ? Iliotibial band syndrome of right side 12/16/2017  ? Depression 07/11/2017  ? Intervertebral lumbar disc disorder with myelopathy, lumbar region 04/25/2017  ? Family history of colon cancer in mother 01/22/2017  ? Primary osteoarthritis of both first carpometacarpal joints 01/02/2017  ? Pain 11/21/2016  ? Gastroenteritis 09/13/2016  ? Chronic anticoagulation 05/24/2015  ? Coronary artery disease involving native coronary artery  of native heart with angina pectoris ([place]) 12/20/2014  ? Iliotibial band syndrome of left side 11/16/2014  ? Diverticulitis of colon 08/30/2014  ? Chest pain, atypical 07/03/2014  ? Diarrhea 12/22/2013  ? Permanent atrial fibrillation ([place]) 11/21/2012  ? Greater trochanteric bursitis of both hips 05/21/2012  ? Dizzinesses 11/07/2011  ? 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 Osteoarthritis of left hip 07/03/2010  ? [place] DISEASE, LUMBOSACRAL SPINE 05/18/2010  ? SYNCOPE 10/27/2008  ? Essential hypertension 06/16/2007  ? GERD (gastroesophageal reflux disease) 06/16/2007  ? COLONIC POLYPS, HX OF 06/16/2007  ? ? ?Past Medical History:  ?Diagnosis Date  ? Acute blood loss anemia 12/25/2017  ? Allergic  rhinitis   ? Anxiety   ? Arthritis   ? "my whole spine" (07/01/2017)  ? Atrial fibrillation ([place])   ? Bowel obstruction ([place])   ? in [place]  ? Bradycardia with 41-50 beats per minute 12/27/2017  ? Cancer [facility])   ? Cellulitis of leg, right with large prepatella hematoma and open wounds 12/26/2017  ? CKD (chronic kidney disease) stage 2, GFR 60-89 ml/min   ? Colon polyps   ? Coronary artery disease   ? 10/18 PCI/DES to p/m LCx with cutting balloon to mLcx  ? Diverticulosis of colon   ? GERD (gastroesophageal reflux disease)   ? Hip bursitis 2010  ? Dr Para March, Post op seroma  ? History of colon polyps   ? HSV (herpes simplex virus) anogenital infection 07/2019  ? HTN (hypertension)   ? IBS (irritable bowel syndrome)   ? constipation predominant - Dr Earlean Shawl  ? Lichen sclerosus   ? Osteopenia 11/2016  ? T score -2.0 FRAX 15%/4.3%  ? PAC (premature atrial contraction)   ? Symptomatiic  ? Renal insufficiency   ? Right bundle branch block (RBBB) on electrocardiogram (ECG) 12/27/2017  ? Scoliosis   ? SVT (supraventricular tachycardia) ([place])   ? brief history  ? 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 biopsy showing vulvar atypia, possible VIN I  ? ? ?Past Surgical History:  ?Procedure Laterality Date  ? ANTERIOR AND POSTERIOR VAGINAL REPAIR  01/2002  ? Archie Endo 01/23/2011  ? APPENDECTOMY  1948  ? CARDIAC CATHETERIZATION  06/26/2017  ? CORONARY ANGIOPLASTY WITH STENT PLACEMENT  07/01/2017  ? CORONARY ATHERECTOMY N/A 07/01/2017  ? Procedure: CORONARY ATHERECTOMY;  Surgeon: Belva Crome, MD;  Location: [facility];  Service: Cardiovascular;  Laterality: N/A;  ? CORONARY STENT INTERVENTION N/A 07/01/2017  ? Procedure: CORONARY STENT INTERVENTION;  Surgeon: Belva Crome, MD;  Location: [facility];  Service: Cardiovascular;  Laterality: N/A;  ? HAMMER TOE SURGERY    ? HEMORRHOID BANDING    ? HIP SURGERY Left 04/2009  ? hip examination under anesthesia followed by greater trochanteric bursectomy;  iliotibial band tenotomy/notes 01/20/2011  ? I & D EXTREMITY Right 01/10/2018  ? Procedure: IRRIGATION AND DEBRIDEMENT RIGHT KNEE, APPLY WOUND VAC;  Surgeon: Newt Minion, MD;  Location: [facility];  Service: Orthopedics;  Laterality: Right;  ? KNEE BURSECTOMY Right 04/2009  ? Archie Endo 01/09/2011  ? LEFT ATRIAL APPENDAGE OCCLUSION N/A 08/24/2021  ? Procedure: LEFT ATRIAL APPENDAGE OCCLUSION;  Surgeon: Sherren Mocha, MD;  Location: [facility];  Service: Cardiovascular;  Laterality: N/A;  ? LEFT HEART CATH AND CORONARY ANGIOGRAPHY N/A 06/26/2017  ? Procedure: LEFT HEART CATH AND CORONARY ANGIOGRAPHY;  Surgeon: Belva Crome, MD;  Location: [facility];  Service: Cardiovascular;  Laterality: N/A;  ?  PUBOVAGINAL SLING  01/2002  ? Archie Endo 01/23/2011  ? REDUCTION MAMMAPLASTY    ? TEE WITHOUT CARDIOVERSION N/A 08/24/2021  ? Procedure: TRANSESOPHAGEAL ECHOCARDIOGRAM (TEE);  Surgeon: Sherren Mocha, MD;  Location: [facility];  Service: Cardiovascular;  Laterality: N/A;  ? TEMPORARY PACEMAKER N/A 07/01/2017  ? Procedure: TEMPORARY PACEMAKER;  Surgeon: Belva Crome, MD;  Location: [facility];  Service: Cardiovascular;  Laterality: N/A;  ? VAGINAL HYSTERECTOMY  01/2002  ? Vaginal hysterectomy, bilateral salpingo-oophorectomy/notes 01/23/2011  ? ? ?Current Outpatient Medications  ?Medication Sig Dispense Refill  ? acetaminophen (TYLENOL) 500 MG tablet Take 1,000 mg by mouth every 6 (six) hours as needed (body aches / sore hip). Maximum of 6 tablets daily ('3000mg'$ )    ? atorvastatin (LIPITOR) 40 MG tablet Take 1 tablet (40 mg total) by mouth every Monday, Wednesday, and Friday. 45 tablet 3  ? BIOTIN PO Take 5,000 mcg by mouth in the morning.    ? Cholecalciferol (VITAMIN D3) 50 MCG (2000 UT) capsule Take 1 capsule (2,000 Units total) by mouth daily. 100 capsule 3  ? clobetasol ointment (TEMOVATE) 9.59 % Apply 1 application topically to affected area twice daily for 2 weeks and then reduce to twice a week at  night. 30 g 0  ? Cyanocobalamin (VITAMIN B-12) 1000 MCG SUBL Place 1 tablet (1,000 mcg total) under the tongue daily. 100 tablet 3  ? diclofenac Sodium (VOLTAREN) 1 % GEL Apply 2 g topically 4 (four) times daily a

## 2021-12-08 ENCOUNTER — Inpatient Hospital Stay: Payer: Medicare Other | Attending: Hematology | Admitting: Hematology

## 2021-12-08 ENCOUNTER — Other Ambulatory Visit: Payer: Self-pay

## 2021-12-08 VITALS — BP 111/74 | HR 62 | Temp 97.7°F | Resp 17 | Ht 63.5 in | Wt 138.5 lb

## 2021-12-08 DIAGNOSIS — Z7901 Long term (current) use of anticoagulants: Secondary | ICD-10-CM | POA: Diagnosis not present

## 2021-12-08 DIAGNOSIS — G629 Polyneuropathy, unspecified: Secondary | ICD-10-CM | POA: Diagnosis not present

## 2021-12-08 DIAGNOSIS — Z79899 Other long term (current) drug therapy: Secondary | ICD-10-CM | POA: Diagnosis not present

## 2021-12-08 DIAGNOSIS — D509 Iron deficiency anemia, unspecified: Secondary | ICD-10-CM | POA: Diagnosis not present

## 2021-12-08 DIAGNOSIS — Z87891 Personal history of nicotine dependence: Secondary | ICD-10-CM | POA: Insufficient documentation

## 2021-12-08 DIAGNOSIS — D681 Hereditary factor XI deficiency: Secondary | ICD-10-CM | POA: Diagnosis present

## 2021-12-08 DIAGNOSIS — I4891 Unspecified atrial fibrillation: Secondary | ICD-10-CM | POA: Insufficient documentation

## 2021-12-11 ENCOUNTER — Other Ambulatory Visit: Payer: Self-pay | Admitting: Internal Medicine

## 2021-12-11 DIAGNOSIS — Z1231 Encounter for screening mammogram for malignant neoplasm of breast: Secondary | ICD-10-CM

## 2021-12-13 ENCOUNTER — Other Ambulatory Visit: Payer: Self-pay | Admitting: Hematology

## 2021-12-14 ENCOUNTER — Other Ambulatory Visit: Payer: Self-pay | Admitting: Internal Medicine

## 2021-12-15 ENCOUNTER — Encounter: Payer: Self-pay | Admitting: Hematology

## 2021-12-15 NOTE — Progress Notes (Addendum)
? ? ?HEMATOLOGY/ONCOLOGY CLINIC NOTE ? ?Date of Service: 12/15/2021 ? ?Patient Care Team: ?Plotnikov, Evie Lacks, MD as PCP - General ?Belva Crome, MD as PCP - Cardiology (Cardiology) ?Vickie Epley, MD as PCP - Electrophysiology (Cardiology) ?Irene Shipper, MD as Consulting Physician (Gastroenterology) ?Darleen Crocker, MD as Consulting Physician (Ophthalmology) ?Tomasa Blase, [facility] as Pharmacist (Pharmacist) ?Brunetta Genera, MD as Consulting Physician (Hematology) ? ?CHIEF COMPLAINTS/PURPOSE OF CONSULTATION:  ?Management of iron deficiency anemia ? ?HISTORY OF PRESENTING ILLNESS:  ?  ?Patient with history of mild factor XI  deficiency was referred back for evaluation and management of iron deficiency anemia. ?Patient notes no overt GI bleeding at this time.  No melena no hematochezia. ?She does have a history of atrial fibrillation and is on anticoagulation with Eliquis 2.5 mg p.o. twice daily.  She is also on duloxetine for neuropathy that can cause some platelet dysfunction. ?Patient also continues to be on Nexium which we discussed can reduce her absorption of iron and B12 and other vitamins. ?Patient recently had acute diverticulitis and was treated with Augmentin which she notes that she did not tolerate well.  She is following with gastroenterology Dr. Scarlette Shorts.  She has been evaluated previously for celiac disease which was noted to be negative.  Dr. Henrene Pastor did provide her recommendations on management of her diverticulosis and to avoid recurrent diverticulitis and also for management of her irritable bowel syndrome. ?Patient notes no other acute new symptoms. ?Patient had labs with her primary care physician on 11/20/2021 and was noted to have iron deficiency.  Iron saturation was noted to be 5%.  No ferritin level noted.  B12 at 236.  Serum folic acid 19.3.  CBC with hemoglobin of 8.7 with an MCV of 82.8. ? ?Patient notes no other evidence of blood loss.  No nosebleeds no gum bleeds.  No  hematuria.  No vaginal bleeding. ? ?MEDICAL HISTORY:  ?Past Medical History:  ?Diagnosis Date  ? Acute blood loss anemia 12/25/2017  ? Allergic rhinitis   ? Anxiety   ? Arthritis   ? "my whole spine" (07/01/2017)  ? Atrial fibrillation ([place])   ? Bowel obstruction ([place])   ? in [place]  ? Bradycardia with 41-50 beats per minute 12/27/2017  ? Cancer [facility])   ? Cellulitis of leg, right with large prepatella hematoma and open wounds 12/26/2017  ? CKD (chronic kidney disease) stage 2, GFR 60-89 ml/min   ? Colon polyps   ? Coronary artery disease   ? 10/18 PCI/DES to p/m LCx with cutting balloon to mLcx  ? Diverticulosis of colon   ? GERD (gastroesophageal reflux disease)   ? Hip bursitis 2010  ? Dr Para March, Post op seroma  ? History of colon polyps   ? HSV (herpes simplex virus) anogenital infection 07/2019  ? HTN (hypertension)   ? IBS (irritable bowel syndrome)   ? constipation predominant - Dr Earlean Shawl  ? Lichen sclerosus   ? Osteopenia 11/2016  ? T score -2.0 FRAX 15%/4.3%  ? PAC (premature atrial contraction)   ? Symptomatiic  ? Renal insufficiency   ? Right bundle branch block (RBBB) on electrocardiogram (ECG) 12/27/2017  ? Scoliosis   ? SVT (supraventricular tachycardia) ([place])   ? brief history  ? 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 biopsy showing vulvar atypia, possible VIN I  ? ? ?SURGICAL HISTORY: ?Past Surgical History:  ?Procedure Laterality Date  ? ANTERIOR AND POSTERIOR VAGINAL REPAIR  01/2002  ? Archie Endo  01/23/2011  ? APPENDECTOMY  1948  ? CARDIAC CATHETERIZATION  06/26/2017  ? CORONARY ANGIOPLASTY WITH STENT PLACEMENT  07/01/2017  ? CORONARY ATHERECTOMY N/A 07/01/2017  ? Procedure: CORONARY ATHERECTOMY;  Surgeon: Belva Crome, MD;  Location: [place] [facility];  Service: Cardiovascular;  Laterality: N/A;  ? CORONARY STENT INTERVENTION N/A 07/01/2017  ? Procedure: CORONARY STENT INTERVENTION;  Surgeon: Belva Crome, MD;  Location: [place] [facility];  Service: Cardiovascular;  Laterality:  N/A;  ? HAMMER TOE SURGERY    ? HEMORRHOID BANDING    ? HIP SURGERY Left 04/2009  ? hip examination under anesthesia followed by greater trochanteric bursectomy; iliotibial band tenotomy/notes 01/20/2011  ? I & D EXTREMITY Right 01/10/2018  ? Procedure: IRRIGATION AND DEBRIDEMENT RIGHT KNEE, APPLY WOUND VAC;  Surgeon: Newt Minion, MD;  Location: [place];  Service: Orthopedics;  Laterality: Right;  ? KNEE BURSECTOMY Right 04/2009  ? Archie Endo 01/09/2011  ? LEFT ATRIAL APPENDAGE OCCLUSION N/A 08/24/2021  ? Procedure: LEFT ATRIAL APPENDAGE OCCLUSION;  Surgeon: Sherren Mocha, MD;  Location: [place] [facility];  Service: Cardiovascular;  Laterality: N/A;  ? LEFT HEART CATH AND CORONARY ANGIOGRAPHY N/A 06/26/2017  ? Procedure: LEFT HEART CATH AND CORONARY ANGIOGRAPHY;  Surgeon: Belva Crome, MD;  Location: [place] [facility];  Service: Cardiovascular;  Laterality: N/A;  ? PUBOVAGINAL SLING  01/2002  ? Archie Endo 01/23/2011  ? REDUCTION MAMMAPLASTY    ? TEE WITHOUT CARDIOVERSION N/A 08/24/2021  ? Procedure: TRANSESOPHAGEAL ECHOCARDIOGRAM (TEE);  Surgeon: Sherren Mocha, MD;  Location: [place] [facility];  Service: Cardiovascular;  Laterality: N/A;  ? TEMPORARY PACEMAKER N/A 07/01/2017  ? Procedure: TEMPORARY PACEMAKER;  Surgeon: Belva Crome, MD;  Location: [place] [facility];  Service: Cardiovascular;  Laterality: N/A;  ? VAGINAL HYSTERECTOMY  01/2002  ? Vaginal hysterectomy, bilateral salpingo-oophorectomy/notes 01/23/2011  ? ? ?SOCIAL HISTORY: ?Social History  ? ?Socioeconomic History  ? Marital status: Married  ?  Spouse name: Annalee Genta  ? Number of children: 2  ? Years of education: Not on file  ? Highest education level: Not on file  ?Occupational History  ? Occupation: Garment/textile technologist  ?  Employer: RETIRED  ?Tobacco Use  ? Smoking status: Former  ?  Packs/day: 0.25  ?  Years: 28.00  ?  Pack years: 7.00  ?  Types: Cigarettes  ?  Quit date: 6  ?  Years since quitting: 42.2  ? Smokeless tobacco: Never  ?Vaping Use  ? Vaping  Use: Never used  ?Substance and Sexual Activity  ? Alcohol use: Yes  ?  Comment: 7 vodka drinks a week  ? Drug use: Never  ? Sexual activity: Not Currently  ?  Birth control/protection: Surgical  ?  Comment: hysterectomy  ?Other Topics Concern  ? Not on file  ?Social History Narrative  ? Regular Exercise -  YES  ?   ?   ? ?Social Determinants of Health  ? ?Financial Resource Strain: Low Risk   ? Difficulty of Paying Living Expenses: Not hard at all  ?Food Insecurity: No Food Insecurity  ? Worried About Charity fundraiser in the Last Year: Never true  ? Ran Out of Food in the Last Year: Never true  ?Transportation Needs: No Transportation Needs  ? Lack of Transportation (Medical): No  ? Lack of Transportation (Non-Medical): No  ?Physical Activity: Inactive  ? Days of Exercise per Week: 0 days  ? Minutes of Exercise per Session: 0 min  ?Stress:  No Stress Concern Present  ? Feeling of Stress : Not at all  ?Social Connections: Socially Integrated  ? Frequency of Communication with Friends and Family: More than three times a week  ? Frequency of Social Gatherings with Friends and Family: Twice a week  ? Attends Religious Services: More than 4 times per year  ? Active Member of Clubs or Organizations: No  ? Attends Archivist Meetings: More than 4 times per year  ? Marital Status: Married  ?Intimate Partner Violence: Not At Risk  ? Fear of Current or Ex-Partner: No  ? Emotionally Abused: No  ? Physically Abused: No  ? Sexually Abused: No  ? ? ?FAMILY HISTORY: ?Family History  ?Problem Relation Age of Onset  ? Colon cancer Mother   ?     Dx age 51, died at age 61  ? Diabetes Father   ? Prostate cancer Father   ? Prostate cancer Brother   ? Pancreatic cancer Brother   ? Stomach cancer Son   ? Heart attack Neg Hx   ? Stroke Neg Hx   ? Esophageal cancer Neg Hx   ? ? ?ALLERGIES:  is allergic to macrobid [nitrofurantoin monohyd macro], meloxicam, digoxin and related, ferrous sulfate, keflex [cephalexin], mobic  [meloxicam], and sulfamethoxazole-trimethoprim. ? ?MEDICATIONS:  ?Current Outpatient Medications  ?Medication Sig Dispense Refill  ? acetaminophen (TYLENOL) 500 MG tablet Take 1,000 mg by mouth every 6 (six) hours a

## 2021-12-16 ENCOUNTER — Other Ambulatory Visit: Payer: Self-pay | Admitting: Interventional Cardiology

## 2021-12-18 ENCOUNTER — Encounter: Payer: Self-pay | Admitting: *Deleted

## 2021-12-18 ENCOUNTER — Inpatient Hospital Stay: Payer: Medicare Other | Attending: Hematology

## 2021-12-18 ENCOUNTER — Other Ambulatory Visit: Payer: Self-pay

## 2021-12-18 VITALS — BP 114/84 | HR 65 | Temp 97.9°F | Resp 18

## 2021-12-18 DIAGNOSIS — D509 Iron deficiency anemia, unspecified: Secondary | ICD-10-CM | POA: Insufficient documentation

## 2021-12-18 MED ORDER — ACETAMINOPHEN 325 MG PO TABS
650.0000 mg | ORAL_TABLET | Freq: Once | ORAL | Status: AC
  Administered 2021-12-18: 650 mg via ORAL
  Filled 2021-12-18: qty 2

## 2021-12-18 MED ORDER — SODIUM CHLORIDE 0.9 % IV SOLN
300.0000 mg | Freq: Once | INTRAVENOUS | Status: AC
  Administered 2021-12-18: 300 mg via INTRAVENOUS
  Filled 2021-12-18: qty 300

## 2021-12-18 MED ORDER — LORATADINE 10 MG PO TABS
10.0000 mg | ORAL_TABLET | Freq: Once | ORAL | Status: AC
  Administered 2021-12-18: 10 mg via ORAL
  Filled 2021-12-18: qty 1

## 2021-12-18 MED ORDER — SODIUM CHLORIDE 0.9 % IV SOLN: Freq: Once | INTRAVENOUS | Status: AC

## 2021-12-18 NOTE — Telephone Encounter (Signed)
Eliquis 2.5 mg refill request received. Patient is 87 years old, weight- 62.8 kg, Crea- 1.06 on 11/20/21, Diagnosis- afib, and last seen by Christen Bame, NP on 11/23/21. Dose is appropriate based on dosing criteria. Will send in refill to requested pharmacy.   ?

## 2021-12-18 NOTE — Patient Instructions (Signed)

## 2021-12-18 NOTE — Progress Notes (Signed)
Scheduling message sent to scheduling to add 3rd dose of Venofer to schedule. ?

## 2021-12-18 NOTE — Progress Notes (Signed)
Patient was observed for 30 minutes post IV iron with no adverse reactions. Vitals stable and patient in no distress upon leaving infusion clinic.  ?

## 2021-12-20 ENCOUNTER — Telehealth: Payer: Self-pay | Admitting: Hematology

## 2021-12-20 NOTE — Telephone Encounter (Signed)
Per 4/11 in basket.  Called and spoke to pt.  Pt confirmed appointment  ?

## 2021-12-25 ENCOUNTER — Telehealth: Payer: Self-pay | Admitting: Physical Medicine and Rehabilitation

## 2021-12-25 ENCOUNTER — Inpatient Hospital Stay: Payer: Medicare Other

## 2021-12-25 ENCOUNTER — Other Ambulatory Visit: Payer: Self-pay

## 2021-12-25 VITALS — BP 119/79 | HR 51 | Temp 99.0°F | Resp 18

## 2021-12-25 DIAGNOSIS — D509 Iron deficiency anemia, unspecified: Secondary | ICD-10-CM

## 2021-12-25 MED ORDER — SODIUM CHLORIDE 0.9 % IV SOLN: Freq: Once | INTRAVENOUS | Status: AC

## 2021-12-25 MED ORDER — SODIUM CHLORIDE 0.9 % IV SOLN
300.0000 mg | Freq: Once | INTRAVENOUS | Status: AC
  Administered 2021-12-25: 300 mg via INTRAVENOUS
  Filled 2021-12-25: qty 300

## 2021-12-25 MED ORDER — LORATADINE 10 MG PO TABS
10.0000 mg | ORAL_TABLET | Freq: Once | ORAL | Status: AC
  Administered 2021-12-25: 10 mg via ORAL
  Filled 2021-12-25: qty 1

## 2021-12-25 MED ORDER — ACETAMINOPHEN 325 MG PO TABS
650.0000 mg | ORAL_TABLET | Freq: Once | ORAL | Status: AC
  Administered 2021-12-25: 650 mg via ORAL
  Filled 2021-12-25: qty 2

## 2021-12-25 NOTE — Telephone Encounter (Signed)
Pt called requesting a call back to set an appt. Please call pt at [phone] ?

## 2021-12-25 NOTE — Patient Instructions (Signed)

## 2021-12-25 NOTE — Progress Notes (Signed)
Patient declined to stay for 30 minutes post-observation following administration of venofer infusion. Vital signs retaken and remained stable. Patient showed no signs of distress upon discharge.  ?

## 2021-12-26 ENCOUNTER — Telehealth: Payer: Self-pay | Admitting: Hematology

## 2021-12-26 ENCOUNTER — Telehealth: Payer: Self-pay | Admitting: Physical Medicine and Rehabilitation

## 2021-12-26 NOTE — Telephone Encounter (Signed)
.  Called patient to schedule appointment per 4/17 inbasket, patient is aware of date and time.   ?

## 2021-12-26 NOTE — Telephone Encounter (Signed)
Patient called needing an appointment with Dr. Ernestina Patches for her back. The number to contact patient is [phone] ?

## 2022-01-01 ENCOUNTER — Ambulatory Visit: Payer: Medicare Other | Admitting: Physician Assistant

## 2022-01-01 ENCOUNTER — Ambulatory Visit: Payer: Medicare Other

## 2022-01-01 ENCOUNTER — Telehealth: Payer: Self-pay

## 2022-01-01 NOTE — Progress Notes (Signed)
? ? ?Chronic Care Management ?Pharmacy Assistant  ? ?Name: Hailey [name]  MRN: [id number] DOB: 05-21-1935 ? ?Reason for Encounter: Disease State-General ?  ? ?Recent office visits:  ?11/23/21 Swinyer, [name], NP (Hyperlipidemia) Lipid panel ordered; med changes: patient stop taking atorvastatin ? ?11/22/21 Plotnikov, Evie Lacks, MD (Iron deficiency Anemia) Ambulatory referral to Hematology / Oncology;Med changes: Cholecalciferol (VITAMIN D3) 50 MCG (2000 UT) capsule, Cyanocobalamin (VITAMIN B-12) 1000 MCG SUBL ? ?Recent consult visits:  ?12/08/21 Brunetta Genera, MD-Oncology (Factor Xl Deficiency) No orders or med changes ? ?12/06/21 Irene Shipper, MD-Gastroenterology (Diverticulitis) No orders, no med changes ? ?12/06/21 Nunzio Cobbs, MD-Obstetrics and gynecology (Chronic vulvitis) WET PREP FOR [place], YEAST, CLUE; med changes:terconazole (TERAZOL 7) 0.4 % vaginal cream ? ?12/06/21 Lorine Bears, NP-Physical Medicine and Rehabilitation (Low back pain and right leg pain) No orders or med changes ? ?11/21/21 Zehr, Janett Billow D, PA-C (Diverticulitis, abdominal pain) No orders; med changes: amoxicillin-clavulanate (AUGMENTIN) 875-125 MG tablet ? ?11/16/21 Yisroel Ramming, Everardo All, MD-Obstetrics and Gynecology (Vulvar lesion) Orders: Urinalysis with Culture Reflex, Urine Culture, no med changes ? ?11/07/21 Magnus Sinning, MD-Physical Medicine and Rehabilitation (Low back,and right/left leg pain) Orders:MR LUMBAR SPINE WO CONTRAST, no med changes ? ?09/14/21 Newt Minion, MD-Orthopedic surgery (Low back pain) Orders: XR Lumbar Spine 2-3 Views and Ambulatory referral to Physical Medicine Rehab, no med changes ? ?08/21/21 Newt Minion, MD-(Wound of right leg) No orders or med changes ? ?08/16/21 Irene Shipper, MD-Gastroenterology (Irritable bowel syndrome) No orders or med changes ? ?08/01/21 Vickie Epley, MD-Cardiology (PAF) No orders or med changes ? ?07/25/22 Newt Minion, MD-(Wound of right leg)  No orders or med changes ? ?07/18/21 Belva Crome, MD-Cardiology (Atrial Fibrillation) Orders: Ambulatory referral to Cardiac Electrophysiology, no med changes ? ?Hospital visits:  ?Medication Reconciliation was completed by comparing discharge summary, patient?s EMR and Pharmacy list, and upon discussion with patient. ? ?Admitted to the hospital on 11/19/21 due to syncope. Discharge date was 11/20/21. Discharged from [facility] ED   ? ?Medications that remain the same after Hospital Discharge:??  ?-All other medications will remain the same.   ? ?Admitted to the hospital on 08/24/21 due to permanent atrial fibrillation. Discharge date was 08/24/21. Discharged from [facility].   ? ?Medications that remain the same after Hospital Discharge:??  ?-All other medications will remain the same.   ? ?Admitted to the hospital on 07/23/21 due to fall. Discharge date was 07/23/21. Discharged from [place] ED.  ? ?Medications that remain the same after Hospital Discharge:??  ?-All other medications will remain the same.   ? ?Medications: ?Outpatient Encounter Medications as of 01/01/2022  ?Medication Sig  ? acetaminophen (TYLENOL) 500 MG tablet Take 1,000 mg by mouth every 6 (six) hours as needed (body aches / sore hip). Maximum of 6 tablets daily ('3000mg'$ )  ? atorvastatin (LIPITOR) 40 MG tablet Take 1 tablet (40 mg total) by mouth every Monday, Wednesday, and Friday.  ? BIOTIN PO Take 5,000 mcg by mouth in the morning.  ? Cholecalciferol (VITAMIN D3) 50 MCG (2000 UT) capsule Take 1 capsule (2,000 Units total) by mouth daily.  ? clobetasol ointment (TEMOVATE) 7.25 % Apply 1 application topically to affected area twice daily for 2 weeks and then reduce to twice a week at night.  ? Cyanocobalamin (VITAMIN B-12) 1000 MCG SUBL Place 1 tablet (1,000 mcg total) under the tongue daily.  ? diclofenac Sodium (VOLTAREN)  1 % GEL Apply 2 g topically 4 (four) times daily as needed (back  pain.).  ? dicyclomine (BENTYL) 20 MG tablet Take 1 by mouth every 4-6 hours as needed for cramping  ? DULoxetine (CYMBALTA) 60 MG capsule Take 1 capsule (60 mg total) by mouth daily. Overdue for annual physical with labs  ? ELIQUIS 2.5 MG TABS tablet TAKE 1 TABLET BY MOUTH TWICE DAILY.  ? empagliflozin (JARDIANCE) 10 MG TABS tablet Take 1 tablet (10 mg total) by mouth daily before breakfast.  ? esomeprazole (NEXIUM) 40 MG capsule TAKE ONE CAPSULE ONCE DAILY. (Patient taking differently: 40 mg daily.)  ? Homeopathic Products (ARNICARE) GEL Apply 1 application topically daily as needed (skin bruising).  ? hyoscyamine (LEVSIN) 0.125 MG tablet TAKE 1-2 TABLETS (0.125-0.25 MG) BY MOUTH EVERY 4 HOURS AS NEEDED FOR UP TO 10 DAYS FOR CRAMPING. (Patient taking differently: Take 0.125-0.25 mg by mouth every 4 (four) hours as needed for cramping.)  ? LORazepam (ATIVAN) 1 MG tablet TAKE ONE TABLET BY MOUTH TWICE DAILY AS NEEDED FOR ANXIETY / SLEEP. (Patient taking differently: Take 1-1.5 mg by mouth at bedtime.)  ? losartan (COZAAR) 25 MG tablet TAKE 1 TABLET ONCE DAILY. (Patient taking differently: Take 25 mg by mouth daily.)  ? metoprolol succinate (TOPROL-XL) 25 MG 24 hr tablet Take 1 tablet (25 mg total) by mouth daily.  ? Multiple Vitamins-Minerals (PRESERVISION AREDS 2+MULTI VIT) CAPS Take 1 capsule by mouth 2 (two) times daily.  ? nitroGLYCERIN (NITROSTAT) 0.4 MG SL tablet Place 0.4 mg under the tongue every 5 (five) minutes as needed for chest pain (Call 911 at 3rd dose within 15 minutes.).  ? ondansetron (ZOFRAN-ODT) 4 MG disintegrating tablet DISSOLVE 1 TABLET IN MOUTH EVERY 8 HOURS AS NEEDED FOR NAUSEA OR VOMITING.  ? Peppermint Oil (IBGARD) 90 MG CPCR Take 1 capsule before meals (Patient taking differently: Take 1 capsule by mouth 2 (two) times daily before a meal.)  ? Polyethyl Glycol-Propyl Glycol (LUBRICANT EYE DROPS) 0.4-0.3 % SOLN Place 1-2 drops into both eyes 3 (three) times daily as needed (dry/irritated  eyes.).  ? polyethylene glycol (MIRALAX / GLYCOLAX) 17 g packet Take 17 g by mouth daily.  ? saccharomyces boulardii (FLORASTOR) 250 MG capsule Take 1 capsule (250 mg total) by mouth 2 (two) times daily. (Patient taking differently: Take 250 mg by mouth daily.)  ? sodium chloride (OCEAN) 0.65 % SOLN nasal spray Place 1 spray into both nostrils at bedtime as needed for congestion.  ? spironolactone (ALDACTONE) 25 MG tablet Take 0.5 tablets (12.5 mg total) by mouth daily. (Patient taking differently: Take 12.5 mg by mouth every Monday, Wednesday, and Friday.)  ? terconazole (TERAZOL 7) 0.4 % vaginal cream Place 1 applicator vaginally at bedtime. One applicator full QHS for seven days of therapy  ? ?No facility-administered encounter medications on file as of 01/01/2022.  ? ? ?Note: ?Patient has appointment with Dr. Alain Marion today and would like a call back next month ? ? ?Care Gaps: ?Colonoscopy-NA ?Diabetic Foot Exam-NA ?Mammogram-NA ?Ophthalmology-NA ?Dexa Scan - NA ?Annual Well Visit - NA ?Micro albumin-NA ?Hemoglobin A1c- 07/04/14 ? ?Star Rating Drugs: ?Atorvastatin 40 mg-last fill 01/11/21 90 ds ?Losartan 25 mg-last fill 11/03/21 90 ds ?Jardiance 10 mg-last fill 12/14/21 30 ds ? ?Ethelene Hal ?Clinical Pharmacist Assistant ?[phone]  ?

## 2022-01-02 ENCOUNTER — Other Ambulatory Visit: Payer: Self-pay

## 2022-01-02 ENCOUNTER — Inpatient Hospital Stay: Payer: Medicare Other

## 2022-01-02 VITALS — BP 127/89 | HR 52 | Temp 98.0°F | Resp 17

## 2022-01-02 DIAGNOSIS — D509 Iron deficiency anemia, unspecified: Secondary | ICD-10-CM | POA: Diagnosis not present

## 2022-01-02 MED ORDER — LORATADINE 10 MG PO TABS
10.0000 mg | ORAL_TABLET | Freq: Once | ORAL | Status: AC
  Administered 2022-01-02: 10 mg via ORAL
  Filled 2022-01-02: qty 1

## 2022-01-02 MED ORDER — ACETAMINOPHEN 325 MG PO TABS
650.0000 mg | ORAL_TABLET | Freq: Once | ORAL | Status: AC
  Administered 2022-01-02: 650 mg via ORAL
  Filled 2022-01-02: qty 2

## 2022-01-02 MED ORDER — SODIUM CHLORIDE 0.9 % IV SOLN
300.0000 mg | Freq: Once | INTRAVENOUS | Status: AC
  Administered 2022-01-02: 300 mg via INTRAVENOUS
  Filled 2022-01-02: qty 300

## 2022-01-02 MED ORDER — SODIUM CHLORIDE 0.9 % IV SOLN: Freq: Once | INTRAVENOUS | Status: AC

## 2022-01-02 NOTE — Patient Instructions (Signed)

## 2022-01-03 ENCOUNTER — Ambulatory Visit (INDEPENDENT_AMBULATORY_CARE_PROVIDER_SITE_OTHER): Payer: Medicare Other | Admitting: Internal Medicine

## 2022-01-03 ENCOUNTER — Encounter: Payer: Self-pay | Admitting: Internal Medicine

## 2022-01-03 DIAGNOSIS — Z7901 Long term (current) use of anticoagulants: Secondary | ICD-10-CM

## 2022-01-03 DIAGNOSIS — E538 Deficiency of other specified B group vitamins: Secondary | ICD-10-CM | POA: Diagnosis not present

## 2022-01-03 DIAGNOSIS — M1612 Unilateral primary osteoarthritis, left hip: Secondary | ICD-10-CM | POA: Diagnosis not present

## 2022-01-03 DIAGNOSIS — I10 Essential (primary) hypertension: Secondary | ICD-10-CM

## 2022-01-03 DIAGNOSIS — I25119 Atherosclerotic heart disease of native coronary artery with unspecified angina pectoris: Secondary | ICD-10-CM

## 2022-01-03 DIAGNOSIS — I4821 Permanent atrial fibrillation: Secondary | ICD-10-CM | POA: Diagnosis not present

## 2022-01-03 DIAGNOSIS — D649 Anemia, unspecified: Secondary | ICD-10-CM

## 2022-01-03 NOTE — Assessment & Plan Note (Signed)
On metoprolol

## 2022-01-03 NOTE — Assessment & Plan Note (Signed)
Low dose metoprolol, Eliquis ?

## 2022-01-03 NOTE — Patient Instructions (Signed)
Blue-Emu cream -- use 2-3 times a day ? ?

## 2022-01-03 NOTE — Assessment & Plan Note (Signed)
Eliquis ?Off  ASA, Plavix ?

## 2022-01-03 NOTE — Assessment & Plan Note (Signed)
Dr Irene Limbo ?On IV iron ?

## 2022-01-03 NOTE — Progress Notes (Signed)
? ?Subjective:  ?Patient ID: Hailey [name], female    DOB: 1935-07-16  Age: 87 y.o. MRN: [id number] ? ?CC: No chief complaint on file. ? ? ?HPI ?Hailey [name] presents for low BP, OA, A fib ? ?Outpatient Medications Prior to Visit  ?Medication Sig Dispense Refill  ? acetaminophen (TYLENOL) 500 MG tablet Take 1,000 mg by mouth every 6 (six) hours as needed (body aches / sore hip). Maximum of 6 tablets daily ('3000mg'$ )    ? atorvastatin (LIPITOR) 40 MG tablet Take 1 tablet (40 mg total) by mouth every Monday, Wednesday, and Friday. 45 tablet 3  ? BIOTIN PO Take 5,000 mcg by mouth in the morning.    ? Cholecalciferol (VITAMIN D3) 50 MCG (2000 UT) capsule Take 1 capsule (2,000 Units total) by mouth daily. 100 capsule 3  ? clobetasol ointment (TEMOVATE) 3.23 % Apply 1 application topically to affected area twice daily for 2 weeks and then reduce to twice a week at night. 30 g 0  ? Cyanocobalamin (VITAMIN B-12) 1000 MCG SUBL Place 1 tablet (1,000 mcg total) under the tongue daily. 100 tablet 3  ? diclofenac Sodium (VOLTAREN) 1 % GEL Apply 2 g topically 4 (four) times daily as needed (back pain.).    ? dicyclomine (BENTYL) 20 MG tablet Take 1 by mouth every 4-6 hours as needed for cramping 30 tablet 3  ? DULoxetine (CYMBALTA) 60 MG capsule Take 1 capsule (60 mg total) by mouth daily. Overdue for annual physical with labs 30 capsule 2  ? ELIQUIS 2.5 MG TABS tablet TAKE 1 TABLET BY MOUTH TWICE DAILY. 180 tablet 1  ? empagliflozin (JARDIANCE) 10 MG TABS tablet Take 1 tablet (10 mg total) by mouth daily before breakfast. 90 tablet 3  ? esomeprazole (NEXIUM) 40 MG capsule TAKE ONE CAPSULE ONCE DAILY. (Patient taking differently: 40 mg daily.) 90 capsule 1  ? Homeopathic Products (ARNICARE) GEL Apply 1 application topically daily as needed (skin bruising).    ? hyoscyamine (LEVSIN) 0.125 MG tablet TAKE 1-2 TABLETS (0.125-0.25 MG) BY MOUTH EVERY 4 HOURS AS NEEDED FOR UP TO 10 DAYS FOR CRAMPING. (Patient taking differently: Take  0.125-0.25 mg by mouth every 4 (four) hours as needed for cramping.) 100 tablet 1  ? LORazepam (ATIVAN) 1 MG tablet TAKE ONE TABLET BY MOUTH TWICE DAILY AS NEEDED FOR ANXIETY / SLEEP. (Patient taking differently: Take 1-1.5 mg by mouth at bedtime.) 180 tablet 1  ? losartan (COZAAR) 25 MG tablet TAKE 1 TABLET ONCE DAILY. (Patient taking differently: Take 25 mg by mouth daily.) 90 tablet 3  ? metoprolol succinate (TOPROL-XL) 25 MG 24 hr tablet Take 1 tablet (25 mg total) by mouth daily. 90 tablet 3  ? Multiple Vitamins-Minerals (PRESERVISION AREDS 2+MULTI VIT) CAPS Take 1 capsule by mouth 2 (two) times daily.    ? nitroGLYCERIN (NITROSTAT) 0.4 MG SL tablet Place 0.4 mg under the tongue every 5 (five) minutes as needed for chest pain (Call 911 at 3rd dose within 15 minutes.).    ? ondansetron (ZOFRAN-ODT) 4 MG disintegrating tablet DISSOLVE 1 TABLET IN MOUTH EVERY 8 HOURS AS NEEDED FOR NAUSEA OR VOMITING. 20 tablet 0  ? Peppermint Oil (IBGARD) 90 MG CPCR Take 1 capsule before meals (Patient taking differently: Take 1 capsule by mouth 2 (two) times daily before a meal.)    ? Polyethyl Glycol-Propyl Glycol (LUBRICANT EYE DROPS) 0.4-0.3 % SOLN Place 1-2 drops into both eyes 3 (three) times daily as needed (dry/irritated eyes.).    ?  polyethylene glycol (MIRALAX / GLYCOLAX) 17 g packet Take 17 g by mouth daily.    ? saccharomyces boulardii (FLORASTOR) 250 MG capsule Take 1 capsule (250 mg total) by mouth 2 (two) times daily. (Patient taking differently: Take 250 mg by mouth daily.) 60 capsule 2  ? sodium chloride (OCEAN) 0.65 % SOLN nasal spray Place 1 spray into both nostrils at bedtime as needed for congestion.    ? spironolactone (ALDACTONE) 25 MG tablet Take 0.5 tablets (12.5 mg total) by mouth daily. (Patient taking differently: Take 12.5 mg by mouth every Monday, Wednesday, and Friday.) 45 tablet 3  ? terconazole (TERAZOL 7) 0.4 % vaginal cream Place 1 applicator vaginally at bedtime. One applicator full QHS for  seven days of therapy 45 g 0  ? ?No facility-administered medications prior to visit.  ? ? ?ROS: ?Review of Systems ? ?Objective:  ?BP 100/72 (BP Location: Left Arm, Patient Position: Sitting, Cuff Size: Large)   Pulse 72   Temp 97.7 ?F (36.5 ?C) (Oral)   Wt 136 lb (61.7 kg)   SpO2 94%   BMI 23.71 kg/m?  ? ?BP Readings from Last 3 Encounters:  ?01/03/22 100/72  ?01/02/22 127/89  ?12/25/21 119/79  ? ? ?Wt Readings from Last 3 Encounters:  ?01/03/22 136 lb (61.7 kg)  ?12/08/21 138 lb 8 oz (62.8 kg)  ?12/06/21 138 lb (62.6 kg)  ? ? ?Physical Exam ? ?Lab Results  ?Component Value Date  ? WBC 7.2 11/20/2021  ? HGB 8.7 (L) 11/20/2021  ? HCT 27.4 (L) 11/20/2021  ? PLT 279 11/20/2021  ? GLUCOSE 107 (H) 11/20/2021  ? CHOL 178 11/29/2021  ? TRIG 84 11/29/2021  ? HDL 86 11/29/2021  ? LDLDIRECT 89.1 06/29/2013  ? [place] 77 11/29/2021  ? ALT 8 11/19/2021  ? AST 23 11/19/2021  ? NA 134 (L) 11/20/2021  ? K 3.8 11/20/2021  ? CL 107 11/20/2021  ? CREATININE 1.06 (H) 11/20/2021  ? BUN 18 11/20/2021  ? CO2 21 (L) 11/20/2021  ? TSH 2.11 04/29/2015  ? INR 1.1 12/02/2019  ? HGBA1C 5.6 07/04/2014  ? ? ?MR LUMBAR SPINE WO CONTRAST ? ?Result Date: 11/29/2021 ?CLINICAL DATA:  Low back pain; symptoms persist with > 6 wks treatment. EXAM: MRI LUMBAR SPINE WITHOUT CONTRAST TECHNIQUE: Multiplanar, multisequence MR imaging of the lumbar spine was performed. No intravenous contrast was administered. COMPARISON:  MR lumbar 03/20/2017; X-ray lumbar 09/14/2021. FINDINGS: Segmentation:  Standard. Alignment: Scoliotic thoracolumbar curvature. Mild retrolisthesis at L1-2, L2-3, and L3-4. Vertebrae: No fracture, evidence of discitis, or suspicious bone lesion. Scattered intraosseous hemangiomas. Conus medullaris and cauda equina: Conus extends to the L1 level. Conus and cauda equina appear normal. Paraspinal and other soft tissues: No acute findings. Disc levels: T12-L1: Mild disc bulge. No foraminal or canal stenosis. Unchanged. L1-L2: Disc height  loss with mild disc bulge and endplate ridging. Mild left foraminal stenosis. No canal stenosis. Unchanged. L2-L3: Retrolisthesis. Diffuse disc bulge with small caudally extending right paracentral disc extrusion. Mild bilateral facet arthropathy. Mild canal stenosis with moderate bilateral foraminal stenosis. Findings have progressed from prior. L3-L4: Diffuse disc bulge with mild bilateral facet arthropathy and ligamentum flavum buckling. Findings result in mild-moderate canal stenosis with moderate right foraminal stenosis. Findings progressed from prior. L4-L5: Disc bulge with endplate osteophytic ridging. Advanced bilateral facet arthropathy. Severe right and mild left foraminal stenosis. No significant interval progression. L5-S1: Disc height loss and endplate ridging. Mild facet hypertrophy. No canal stenosis. Mild-to-moderate left and mild right foraminal stenosis. Unchanged.  IMPRESSION: 1. Multilevel lumbar spondylosis, slightly progressed from prior MRI. 2. Mild-moderate canal stenosis with moderate right foraminal stenosis at L3-4. 3. Mild canal stenosis and moderate bilateral foraminal stenosis at L2-3. 4. Severe right and mild left foraminal stenosis at L4-5. Electronically Signed   By: Davina Poke D.O.   On: 11/29/2021 13:23  ? ? ?Assessment & Plan:  ? ?Problem List Items Addressed This Visit   ? ? Essential hypertension  ?  On metoprolol ?  ?  ? Osteoarthritis of left hip  ?  Blue-Emu cream was recommended to use 2-3 times a day ? ? ?  ?  ? Permanent atrial fibrillation ([place])  ?  Low dose metoprolol, Eliquis ?  ?  ? Coronary artery disease involving native coronary artery of native heart with angina pectoris ([place])  ?  No angina ? ? ?  ?  ? Chronic anticoagulation  ?   Eliquis ?Off  ASA, Plavix ?  ?  ? Anemia  ?  Dr Irene Limbo ?On IV iron ? ?  ?  ? Low serum vitamin B12  ?  On B12 ? ?  ?  ?  ? ? ?No orders of the defined types were placed in this encounter. ?  ? ? ?Follow-up: Return for a follow-up  visit. ? ?Walker Kehr, MD ?

## 2022-01-03 NOTE — Assessment & Plan Note (Signed)
No angina 

## 2022-01-03 NOTE — Assessment & Plan Note (Signed)
Blue-Emu cream was recommended to use 2-3 times a day ? ?

## 2022-01-03 NOTE — Assessment & Plan Note (Signed)
On B12 

## 2022-01-04 ENCOUNTER — Telehealth: Payer: Self-pay | Admitting: Physical Medicine and Rehabilitation

## 2022-01-04 ENCOUNTER — Telehealth: Payer: Self-pay

## 2022-01-04 DIAGNOSIS — N9089 Other specified noninflammatory disorders of vulva and perineum: Secondary | ICD-10-CM

## 2022-01-04 DIAGNOSIS — N763 Subacute and chronic vulvitis: Secondary | ICD-10-CM

## 2022-01-04 NOTE — Telephone Encounter (Signed)
I would have her try some Vaseline to moisturize the area.  ? ?I recommend a referral to Dr. Earnie Larsson at [place] [facility] due to her ongoing symptoms of vulvar discomfort and the persistent vulvar lesion. ? ? ? ?

## 2022-01-04 NOTE — Telephone Encounter (Signed)
Pt called to reschedule appt. Please call pt at [phone] 

## 2022-01-04 NOTE — Telephone Encounter (Signed)
Pt LVM re: ongoing vulvar sxs and wanted to know if something could be called in or if Dr. Quincy Simmonds would like an OV to take a look.  ? ?Returned call and left detailed VM per DPR to get more info re: specific sxs and if pt is still using clobetasol ointment.  ?

## 2022-01-04 NOTE — Telephone Encounter (Signed)
Pt returned call saying that she is still using clobetasol ointment. Some days it helps better than others. Reports pain with voiding and with wiping. Pt reports sxs are still mainly around the area of past biopsy. Please advise.  ?

## 2022-01-05 ENCOUNTER — Ambulatory Visit: Payer: Medicare Other

## 2022-01-05 NOTE — Telephone Encounter (Signed)
Hailey [name] LDVM per DPR with appt information and phone number in case she needed to r/s.  ?

## 2022-01-05 NOTE — Telephone Encounter (Signed)
Left detailed VM per DPR.  ? ?[place] office @ [facility] and spoke with [place]. I gave pt's demographic information and reason for referral. Lovey Newcomer reported that Dr. Lamount Cohen does her "women's clinic" on Friday mornings only. I scheduled pt for first available on 01/19/2022 @ 930 with arrival time of 915. Pt to bring ID/INS card/List of current medications.  ? ?Will fax office notes/pathologies to [phone] Attn: Lovey Newcomer.  ?

## 2022-01-08 ENCOUNTER — Telehealth: Payer: Self-pay

## 2022-01-08 ENCOUNTER — Ambulatory Visit: Payer: Self-pay

## 2022-01-08 ENCOUNTER — Ambulatory Visit: Payer: Medicare Other | Admitting: Physical Medicine and Rehabilitation

## 2022-01-08 ENCOUNTER — Encounter: Payer: Self-pay | Admitting: Physical Medicine and Rehabilitation

## 2022-01-08 VITALS — BP 71/54 | HR 90

## 2022-01-08 DIAGNOSIS — M5416 Radiculopathy, lumbar region: Secondary | ICD-10-CM

## 2022-01-08 MED ORDER — METHYLPREDNISOLONE ACETATE 80 MG/ML IJ SUSP
80.0000 mg | Freq: Once | INTRAMUSCULAR | Status: AC
  Administered 2022-01-08: 80 mg

## 2022-01-08 NOTE — Telephone Encounter (Signed)
Thank you for the update. You may close the encounter. 

## 2022-01-08 NOTE — Patient Instructions (Signed)

## 2022-01-08 NOTE — Progress Notes (Signed)
Pt state lower back pain that travels down her right leg. Pt state walking, standing and sitting makes the pain worse. Pt state she takes over the counter pain meds to help ease her pain. ? ?Numeric Pain Rating Scale and Functional Assessment ?Average Pain 8 ? ? ?In the last MONTH (on 0-10 scale) has pain interfered with the following? ? ?1. General activity like being  able to carry out your everyday physical activities such as walking, climbing stairs, carrying groceries, or moving a chair?  ?Rating(10) ? ? ?+Driver, -BT, -Dye Allergies. ? ?

## 2022-01-08 NOTE — Progress Notes (Signed)
? ? ?Chronic Care Management ?Pharmacy Assistant  ? ?Name: Hailey [name]  MRN: [id number] DOB: 06/23/1935 ? ? ? ?Reason for Encounter: Disease State-General Adherence Call ?  ? ?Recent office visits: No orders  ?01/03/22 Plotnikov, Evie Lacks, MD (CAD) No orders or medication changes ? ?01/08/22 Newton,Frederic, MD-Physical Medicine and Rehabilitation (Low back and right leg pain) Epidural Steroid injection, methylPREDNISolone acetate (DEPO-MEDROL) injection 80 mg ? ?11/23/21 Swinyer, [name], NP (Hyperlipidemia) Lipid panel ordered; med changes: patient stop taking atorvastatin ?  ?11/22/21 Plotnikov, Evie Lacks, MD (Iron deficiency Anemia) Ambulatory referral to Hematology / Oncology;Med changes: Cholecalciferol (VITAMIN D3) 50 MCG (2000 UT) capsule, Cyanocobalamin (VITAMIN B-12) 1000 MCG SUBL ?  ? ?Recent consult visits:  ?12/08/21 Brunetta Genera, MD-Oncology (Factor Xl Deficiency) No orders or med changes ?  ?12/06/21 Irene Shipper, MD-Gastroenterology (Diverticulitis) No orders, no med changes ?  ?12/06/21 Nunzio Cobbs, MD-Obstetrics and gynecology (Chronic vulvitis) WET PREP FOR [place], YEAST, CLUE; med changes:terconazole (TERAZOL 7) 0.4 % vaginal cream ?  ?12/06/21 Lorine Bears, NP-Physical Medicine and Rehabilitation (Low back pain and right leg pain) No orders or med changes ?  ?11/21/21 Zehr, Janett Billow D, PA-C (Diverticulitis, abdominal pain) No orders; med changes: amoxicillin-clavulanate (AUGMENTIN) 875-125 MG tablet ?  ?11/16/21 Yisroel Ramming, Everardo All, MD-Obstetrics and Gynecology (Vulvar lesion) Orders: Urinalysis with Culture Reflex, Urine Culture, no med changes ?  ?11/07/21 Magnus Sinning, MD-Physical Medicine and Rehabilitation (Low back,and right/left leg pain) Orders:MR LUMBAR SPINE WO CONTRAST, no med changes ?  ?09/14/21 Newt Minion, MD-Orthopedic surgery (Low back pain) Orders: XR Lumbar Spine 2-3 Views and Ambulatory referral to Physical Medicine Rehab, no med changes ?   ?08/21/21 Newt Minion, MD-(Wound of right leg) No orders or med changes ?  ?08/16/21 Irene Shipper, MD-Gastroenterology (Irritable bowel syndrome) No orders or med changes ?  ?08/01/21 Vickie Epley, MD-Cardiology (PAF) No orders or med changes ?  ?07/25/22 Newt Minion, MD-(Wound of right leg) No orders or med changes ?  ?07/18/21 Belva Crome, MD-Cardiology (Atrial Fibrillation) Orders: Ambulatory referral to Cardiac Electrophysiology, no med changes ?  ? ?Hospital visits:  ?Medication Reconciliation was completed by comparing discharge summary, patient?s EMR and Pharmacy list, and upon discussion with patient. ?  ?Admitted to the hospital on 11/19/21 due to syncope. Discharge date was 11/20/21. Discharged from [facility]   ?  ?Medications that remain the same after Hospital Discharge:??  ?-All other medications will remain the same.   ?  ?Admitted to the hospital on 08/24/21 due to permanent atrial fibrillation. Discharge date was 08/24/21. Discharged from [facility].   ?  ?Medications that remain the same after Hospital Discharge:??  ?-All other medications will remain the same.   ?  ?Admitted to the hospital on 07/23/21 due to fall. Discharge date was 07/23/21. Discharged from [facility].  ?  ?Medications that remain the same after Hospital Discharge:??  ?-All other medications will remain the same.   ?  ? ?Medications: ?Outpatient Encounter Medications as of 01/08/2022  ?Medication Sig  ? acetaminophen (TYLENOL) 500 MG tablet Take 1,000 mg by mouth every 6 (six) hours as needed (body aches / sore hip). Maximum of 6 tablets daily ('3000mg'$ )  ? atorvastatin (LIPITOR) 40 MG tablet Take 1 tablet (40 mg total) by mouth every Monday, Wednesday, and Friday.  ? BIOTIN PO Take 5,000 mcg by mouth in the morning.  ? Cholecalciferol (VITAMIN D3)  50 MCG (2000 UT) capsule Take 1 capsule (2,000 Units total) by mouth daily.  ? clobetasol ointment (TEMOVATE) 9.23 %  Apply 1 application topically to affected area twice daily for 2 weeks and then reduce to twice a week at night.  ? Cyanocobalamin (VITAMIN B-12) 1000 MCG SUBL Place 1 tablet (1,000 mcg total) under the tongue daily.  ? diclofenac Sodium (VOLTAREN) 1 % GEL Apply 2 g topically 4 (four) times daily as needed (back pain.).  ? dicyclomine (BENTYL) 20 MG tablet Take 1 by mouth every 4-6 hours as needed for cramping  ? DULoxetine (CYMBALTA) 60 MG capsule Take 1 capsule (60 mg total) by mouth daily. Overdue for annual physical with labs  ? ELIQUIS 2.5 MG TABS tablet TAKE 1 TABLET BY MOUTH TWICE DAILY.  ? empagliflozin (JARDIANCE) 10 MG TABS tablet Take 1 tablet (10 mg total) by mouth daily before breakfast.  ? esomeprazole (NEXIUM) 40 MG capsule TAKE ONE CAPSULE ONCE DAILY. (Patient taking differently: 40 mg daily.)  ? Homeopathic Products (ARNICARE) GEL Apply 1 application topically daily as needed (skin bruising).  ? hyoscyamine (LEVSIN) 0.125 MG tablet TAKE 1-2 TABLETS (0.125-0.25 MG) BY MOUTH EVERY 4 HOURS AS NEEDED FOR UP TO 10 DAYS FOR CRAMPING. (Patient taking differently: Take 0.125-0.25 mg by mouth every 4 (four) hours as needed for cramping.)  ? LORazepam (ATIVAN) 1 MG tablet TAKE ONE TABLET BY MOUTH TWICE DAILY AS NEEDED FOR ANXIETY / SLEEP. (Patient taking differently: Take 1-1.5 mg by mouth at bedtime.)  ? losartan (COZAAR) 25 MG tablet TAKE 1 TABLET ONCE DAILY. (Patient taking differently: Take 25 mg by mouth daily.)  ? metoprolol succinate (TOPROL-XL) 25 MG 24 hr tablet Take 1 tablet (25 mg total) by mouth daily.  ? Multiple Vitamins-Minerals (PRESERVISION AREDS 2+MULTI VIT) CAPS Take 1 capsule by mouth 2 (two) times daily.  ? nitroGLYCERIN (NITROSTAT) 0.4 MG SL tablet Place 0.4 mg under the tongue every 5 (five) minutes as needed for chest pain (Call 911 at 3rd dose within 15 minutes.).  ? ondansetron (ZOFRAN-ODT) 4 MG disintegrating tablet DISSOLVE 1 TABLET IN MOUTH EVERY 8 HOURS AS NEEDED FOR NAUSEA OR  VOMITING.  ? Peppermint Oil (IBGARD) 90 MG CPCR Take 1 capsule before meals (Patient taking differently: Take 1 capsule by mouth 2 (two) times daily before a meal.)  ? Polyethyl Glycol-Propyl Glycol (LUBRICANT EYE DROPS) 0.4-0.3 % SOLN Place 1-2 drops into both eyes 3 (three) times daily as needed (dry/irritated eyes.).  ? polyethylene glycol (MIRALAX / GLYCOLAX) 17 g packet Take 17 g by mouth daily.  ? saccharomyces boulardii (FLORASTOR) 250 MG capsule Take 1 capsule (250 mg total) by mouth 2 (two) times daily. (Patient taking differently: Take 250 mg by mouth daily.)  ? sodium chloride (OCEAN) 0.65 % SOLN nasal spray Place 1 spray into both nostrils at bedtime as needed for congestion.  ? spironolactone (ALDACTONE) 25 MG tablet Take 0.5 tablets (12.5 mg total) by mouth daily. (Patient taking differently: Take 12.5 mg by mouth every Monday, Wednesday, and Friday.)  ? terconazole (TERAZOL 7) 0.4 % vaginal cream Place 1 applicator vaginally at bedtime. One applicator full QHS for seven days of therapy  ? ?Facility-Administered Encounter Medications as of 01/08/2022  ?Medication  ? methylPREDNISolone acetate (DEPO-MEDROL) injection 80 mg  ? ?Contacted [name] for General Review Call ? ? ?Chart Review: ? ?Have there been any documented new, changed, or discontinued medications since last visit? No (If yes, include name, dose, frequency, date) ?Has there  been any documented recent hospitalizations or ED visits since last visit with Clinical Pharmacist? No ?Brief Summary (including medication and/or Diagnosis changes): ? ? ?Adherence Review: ? ?Does the Clinical Pharmacist Assistant have access to adherence rates? Yes ?Adherence rates for STAR metric medications (List medication(s)/day supply/ last 2 fill dates). ?Adherence rates for medications indicated for disease state being reviewed (List medication(s)/day supply/ last 2 fill dates). ?Does the patient have >5 day gap between last estimated fill dates for any  of the above medications or other medication gaps? No ?Reason for medication gaps. ? ? ?Disease State Questions: ? ?Able to connect with Patient? Yes ?Did patient have any problems with their health recently?

## 2022-01-17 ENCOUNTER — Encounter: Payer: Medicare Other | Admitting: Physical Medicine and Rehabilitation

## 2022-01-18 NOTE — Procedures (Signed)
Lumbosacral Transforaminal Epidural Steroid Injection - Sub-Pedicular Approach with Fluoroscopic Guidance ? ?Patient: Hailey [name]      ?Date of Birth: 1935-05-13 ?MRN: [id number] ?PCP: Plotnikov, Evie Lacks, MD      ?Visit Date: 01/08/2022 ?  ?Universal Protocol:    ?Date/Time: 01/08/2022 ? ?Consent Given By: the patient ? ?Position: PRONE ? ?Additional Comments: ?Vital signs were monitored before and after the procedure. ?Patient was prepped and draped in the usual sterile fashion. ?The correct patient, procedure, and site was verified. ? ? ?Injection Procedure Details:  ? ?Procedure diagnoses: Lumbar radiculopathy [M54.16]   ? ?Meds Administered:  ?Meds ordered this encounter  ?Medications  ? methylPREDNISolone acetate (DEPO-MEDROL) injection 80 mg  ? ? ?Laterality: Right ? ?Location/Site: L4 ? ?Needle:5.0 in., 22 ga.  Short bevel or Quincke spinal needle ? ?Needle Placement: Transforaminal ? ?Findings: ?  ? -Comments: Excellent flow of contrast along the nerve, nerve root and into the epidural space. ? ?Procedure Details: ?After squaring off the end-plates to get a true AP view, the C-arm was positioned so that an oblique view of the foramen as noted above was visualized. The target area is just inferior to the "nose of the scotty dog" or sub pedicular. The soft tissues overlying this structure were infiltrated with 2-3 ml. of 1% Lidocaine without Epinephrine. ? ?The spinal needle was inserted toward the target using a "trajectory" view along the fluoroscope beam.  Under AP and lateral visualization, the needle was advanced so it did not puncture dura and was located close the 6 O'Clock position of the pedical in AP tracterory. Biplanar projections were used to confirm position. Aspiration was confirmed to be negative for CSF and/or blood. A 1-2 ml. volume of Isovue-250 was injected and flow of contrast was noted at each level. Radiographs were obtained for documentation purposes.  ? ?After attaining the desired  flow of contrast documented above, a 0.5 to 1.0 ml test dose of 0.25% Marcaine was injected into each respective transforaminal space.  The patient was observed for 90 seconds post injection.  After no sensory deficits were reported, and normal lower extremity motor function was noted,   the above injectate was administered so that equal amounts of the injectate were placed at each foramen (level) into the transforaminal epidural space. ? ? ?Additional Comments:  ?The patient tolerated the procedure well ?Dressing: 2 x 2 sterile gauze and Band-Aid ?  ? ?Post-procedure details: ?Patient was observed during the procedure. ?Post-procedure instructions were reviewed. ? ?Patient left the clinic in stable condition. ? ?

## 2022-01-18 NOTE — Progress Notes (Signed)
? ?Hailey [name] - 87 y.o. female MRN [id number]  Date of birth: 12/07/1934 ? ?Office Visit Note: ?Visit Date: 01/08/2022 ?PCP: Plotnikov, Evie Lacks, MD ?Referred by: Cassandria Anger, MD ? ?Subjective: ?Chief Complaint  ?Patient presents with  ? Lower Back - Pain  ? Right Leg - Pain  ? ?HPI:  Hailey [name] is a 87 y.o. female who comes in today for planned repeat Right L4-5  Lumbar Transforaminal epidural steroid injection with fluoroscopic guidance.  The patient has failed conservative care including home exercise, medications, time and activity modification.  This injection will be diagnostic and hopefully therapeutic.  Please see requesting physician notes for further details and justification. Patient received more than 50% pain relief from prior injection.  ? ?Referring: Dr. Meridee Score ? ?ROS Otherwise per HPI. ? ?Assessment & Plan: ?Visit Diagnoses:  ?  ICD-10-CM   ?1. Lumbar radiculopathy  M54.16 XR C-ARM NO REPORT  ?  Epidural Steroid injection  ?  methylPREDNISolone acetate (DEPO-MEDROL) injection 80 mg  ?  ?  ?Plan: No additional findings.  ? ?Meds & Orders:  ?Meds ordered this encounter  ?Medications  ? methylPREDNISolone acetate (DEPO-MEDROL) injection 80 mg  ?  ?Orders Placed This Encounter  ?Procedures  ? XR C-ARM NO REPORT  ? Epidural Steroid injection  ?  ?Follow-up: Return if symptoms worsen or fail to improve.  ? ?Procedures: ?No procedures performed  ?Lumbosacral Transforaminal Epidural Steroid Injection - Sub-Pedicular Approach with Fluoroscopic Guidance ? ?Patient: Hailey [name]      ?Date of Birth: 28-Jan-1935 ?MRN: [id number] ?PCP: Plotnikov, Evie Lacks, MD      ?Visit Date: 01/08/2022 ?  ?Universal Protocol:    ?Date/Time: 01/08/2022 ? ?Consent Given By: the patient ? ?Position: PRONE ? ?Additional Comments: ?Vital signs were monitored before and after the procedure. ?Patient was prepped and draped in the usual sterile fashion. ?The correct patient, procedure, and site was  verified. ? ? ?Injection Procedure Details:  ? ?Procedure diagnoses: Lumbar radiculopathy [M54.16]   ? ?Meds Administered:  ?Meds ordered this encounter  ?Medications  ? methylPREDNISolone acetate (DEPO-MEDROL) injection 80 mg  ? ? ?Laterality: Right ? ?Location/Site: L4 ? ?Needle:5.0 in., 22 ga.  Short bevel or Quincke spinal needle ? ?Needle Placement: Transforaminal ? ?Findings: ?  ? -Comments: Excellent flow of contrast along the nerve, nerve root and into the epidural space. ? ?Procedure Details: ?After squaring off the end-plates to get a true AP view, the C-arm was positioned so that an oblique view of the foramen as noted above was visualized. The target area is just inferior to the "nose of the scotty dog" or sub pedicular. The soft tissues overlying this structure were infiltrated with 2-3 ml. of 1% Lidocaine without Epinephrine. ? ?The spinal needle was inserted toward the target using a "trajectory" view along the fluoroscope beam.  Under AP and lateral visualization, the needle was advanced so it did not puncture dura and was located close the 6 O'Clock position of the pedical in AP tracterory. Biplanar projections were used to confirm position. Aspiration was confirmed to be negative for CSF and/or blood. A 1-2 ml. volume of Isovue-250 was injected and flow of contrast was noted at each level. Radiographs were obtained for documentation purposes.  ? ?After attaining the desired flow of contrast documented above, a 0.5 to 1.0 ml test dose of 0.25% Marcaine was injected into each respective transforaminal space.  The patient was observed for 90 seconds post injection.  After no sensory  deficits were reported, and normal lower extremity motor function was noted,   the above injectate was administered so that equal amounts of the injectate were placed at each foramen (level) into the transforaminal epidural space. ? ? ?Additional Comments:  ?The patient tolerated the procedure well ?Dressing: 2 x 2 sterile  gauze and Band-Aid ?  ? ?Post-procedure details: ?Patient was observed during the procedure. ?Post-procedure instructions were reviewed. ? ?Patient left the clinic in stable condition. ?  ? ?Clinical History: ?EXAM: ?MRI LUMBAR SPINE WITHOUT CONTRAST ?  ?TECHNIQUE: ?Multiplanar, multisequence MR imaging of the lumbar spine was ?performed. No intravenous contrast was administered. ?  ?COMPARISON:  MR lumbar 03/20/2017; X-ray lumbar 09/14/2021. ?  ?FINDINGS: ?Segmentation:  Standard. ?  ?Alignment: Scoliotic thoracolumbar curvature. Mild retrolisthesis at ?L1-2, L2-3, and L3-4. ?  ?Vertebrae: No fracture, evidence of discitis, or suspicious bone ?lesion. Scattered intraosseous hemangiomas. ?  ?Conus medullaris and cauda equina: Conus extends to the L1 level. ?Conus and cauda equina appear normal. ?  ?Paraspinal and other soft tissues: No acute findings. ?  ?Disc levels: ?  ?T12-L1: Mild disc bulge. No foraminal or canal stenosis. Unchanged. ?  ?L1-L2: Disc height loss with mild disc bulge and endplate ridging. ?Mild left foraminal stenosis. No canal stenosis. Unchanged. ?  ?L2-L3: Retrolisthesis. Diffuse disc bulge with small caudally ?extending right paracentral disc extrusion. Mild bilateral facet ?arthropathy. Mild canal stenosis with moderate bilateral foraminal ?stenosis. Findings have progressed from prior. ?  ?L3-L4: Diffuse disc bulge with mild bilateral facet arthropathy and ?ligamentum flavum buckling. Findings result in mild-moderate canal ?stenosis with moderate right foraminal stenosis. Findings progressed ?from prior. ?  ?L4-L5: Disc bulge with endplate osteophytic ridging. Advanced ?bilateral facet arthropathy. Severe right and mild left foraminal ?stenosis. No significant interval progression. ?  ?L5-S1: Disc height loss and endplate ridging. Mild facet ?hypertrophy. No canal stenosis. Mild-to-moderate left and mild right ?foraminal stenosis. Unchanged. ?  ?IMPRESSION: ?1. Multilevel lumbar  spondylosis, slightly progressed from prior ?MRI. ?2. Mild-moderate canal stenosis with moderate right foraminal ?stenosis at L3-4. ?3. Mild canal stenosis and moderate bilateral foraminal stenosis at ?L2-3. ?4. Severe right and mild left foraminal stenosis at L4-5. ?  ?  ?Electronically Signed ?  By: Davina Poke D.O. ?  On: 11/29/2021 13:23  ? ? ? ?Objective:  VS:  HT:    WT:   BMI:     BP:(!) 71/54  HR:90bpm  TEMP: ( )  RESP:  ?Physical Exam ?Vitals and nursing note reviewed.  ?Constitutional:   ?   General: She is not in acute distress. ?   Appearance: Normal appearance. She is not ill-appearing.  ?HENT:  ?   Head: Normocephalic and atraumatic.  ?   Right Ear: External ear normal.  ?   Left Ear: External ear normal.  ?Eyes:  ?   Extraocular Movements: Extraocular movements intact.  ?Cardiovascular:  ?   Rate and Rhythm: Normal rate.  ?   Pulses: Normal pulses.  ?Pulmonary:  ?   Effort: Pulmonary effort is normal. No respiratory distress.  ?Abdominal:  ?   General: There is no distension.  ?   Palpations: Abdomen is soft.  ?Musculoskeletal:     ?   General: Tenderness present.  ?   Cervical back: Neck supple.  ?   Right lower leg: No edema.  ?   Left lower leg: No edema.  ?   Comments: Patient has good distal strength with no pain over the greater trochanters.  No clonus  or focal weakness.  ?Skin: ?   Findings: No erythema, lesion or rash.  ?Neurological:  ?   General: No focal deficit present.  ?   Mental Status: She is alert and oriented to person, place, and time.  ?   Sensory: No sensory deficit.  ?   Motor: No weakness or abnormal muscle tone.  ?   Coordination: Coordination normal.  ?Psychiatric:     ?   Mood and Affect: Mood normal.     ?   Behavior: Behavior normal.  ?  ? ?Imaging: ?No results found. ?

## 2022-01-19 DIAGNOSIS — L900 Lichen sclerosus et atrophicus: Secondary | ICD-10-CM | POA: Diagnosis not present

## 2022-01-29 ENCOUNTER — Ambulatory Visit
Admission: RE | Admit: 2022-01-29 | Discharge: 2022-01-29 | Disposition: A | Discharge status: Home / Self Care | Payer: Medicare Other | Source: Ambulatory Visit | Attending: Internal Medicine | Admitting: Internal Medicine

## 2022-01-29 ENCOUNTER — Other Ambulatory Visit: Payer: Medicare Other | Admitting: *Deleted

## 2022-01-29 DIAGNOSIS — E785 Hyperlipidemia, unspecified: Secondary | ICD-10-CM

## 2022-01-29 DIAGNOSIS — Z1231 Encounter for screening mammogram for malignant neoplasm of breast: Secondary | ICD-10-CM | POA: Diagnosis not present

## 2022-01-29 LAB — HEPATIC FUNCTION PANEL
ALT: 14 IU/L (ref 0–32)
AST: 20 IU/L (ref 0–40)
Albumin: 4.2 g/dL (ref 3.6–4.6)
Alkaline Phosphatase: 72 IU/L (ref 44–121)
Bilirubin Total: 0.5 mg/dL (ref 0.0–1.2)
Bilirubin, Direct: 0.19 mg/dL (ref 0.00–0.40)
Total Protein: 6.4 g/dL (ref 6.0–8.5)

## 2022-01-29 LAB — LIPID PANEL
Chol/HDL Ratio: 1.5 ratio (ref 0.0–4.4)
Cholesterol, Total: 167 mg/dL (ref 100–199)
HDL: 109 mg/dL (ref >39)
LDL Chol Calc (NIH): 39 mg/dL (ref 0–99)
Triglycerides: 113 mg/dL (ref 0–149)
VLDL Cholesterol Cal: 19 mg/dL (ref 5–40)

## 2022-01-30 ENCOUNTER — Other Ambulatory Visit: Payer: Self-pay | Admitting: Internal Medicine

## 2022-01-31 ENCOUNTER — Other Ambulatory Visit: Payer: Self-pay | Admitting: Internal Medicine

## 2022-01-31 DIAGNOSIS — R928 Other abnormal and inconclusive findings on diagnostic imaging of breast: Secondary | ICD-10-CM

## 2022-01-31 NOTE — Addendum Note (Signed)
Encounter addended by: Annie Paras on: 01/31/2022 3:50 PM  Actions taken: Letter saved

## 2022-02-08 ENCOUNTER — Ambulatory Visit
Admission: RE | Admit: 2022-02-08 | Discharge: 2022-02-08 | Disposition: A | Discharge status: Home / Self Care | Payer: Medicare Other | Source: Ambulatory Visit | Attending: Internal Medicine | Admitting: Internal Medicine

## 2022-02-08 ENCOUNTER — Ambulatory Visit: Payer: Medicare Other

## 2022-02-08 DIAGNOSIS — R928 Other abnormal and inconclusive findings on diagnostic imaging of breast: Secondary | ICD-10-CM

## 2022-02-08 DIAGNOSIS — R922 Inconclusive mammogram: Secondary | ICD-10-CM | POA: Diagnosis not present

## 2022-02-13 ENCOUNTER — Other Ambulatory Visit: Payer: Self-pay

## 2022-02-13 DIAGNOSIS — D509 Iron deficiency anemia, unspecified: Secondary | ICD-10-CM

## 2022-02-14 ENCOUNTER — Ambulatory Visit (INDEPENDENT_AMBULATORY_CARE_PROVIDER_SITE_OTHER): Payer: Medicare Other | Admitting: *Deleted

## 2022-02-14 ENCOUNTER — Inpatient Hospital Stay: Payer: Medicare Other | Attending: Hematology | Admitting: Hematology

## 2022-02-14 ENCOUNTER — Inpatient Hospital Stay: Payer: Medicare Other

## 2022-02-14 ENCOUNTER — Other Ambulatory Visit: Payer: Self-pay

## 2022-02-14 VITALS — BP 130/82 | HR 54 | Temp 97.8°F | Resp 18 | Ht 63.5 in | Wt 138.1 lb

## 2022-02-14 DIAGNOSIS — D509 Iron deficiency anemia, unspecified: Secondary | ICD-10-CM

## 2022-02-14 DIAGNOSIS — D681 Hereditary factor XI deficiency: Secondary | ICD-10-CM

## 2022-02-14 DIAGNOSIS — N189 Chronic kidney disease, unspecified: Secondary | ICD-10-CM | POA: Insufficient documentation

## 2022-02-14 DIAGNOSIS — Z79899 Other long term (current) drug therapy: Secondary | ICD-10-CM | POA: Insufficient documentation

## 2022-02-14 DIAGNOSIS — E538 Deficiency of other specified B group vitamins: Secondary | ICD-10-CM | POA: Insufficient documentation

## 2022-02-14 DIAGNOSIS — Z0000 Encounter for general adult medical examination without abnormal findings: Secondary | ICD-10-CM | POA: Diagnosis not present

## 2022-02-14 LAB — IRON AND IRON BINDING CAPACITY (CC-WL,HP ONLY)
Iron: 67 ug/dL (ref 28–170)
Saturation Ratios: 21 % (ref 10.4–31.8)
TIBC: 323 ug/dL (ref 250–450)
UIBC: 256 ug/dL (ref 148–442)

## 2022-02-14 LAB — CBC WITH DIFFERENTIAL (CANCER CENTER ONLY)
Abs Immature Granulocytes: 0.16 10*3/uL — ABNORMAL HIGH (ref 0.00–0.07)
Basophils Absolute: 0 10*3/uL (ref 0.0–0.1)
Basophils Relative: 1 %
Eosinophils Absolute: 0.4 10*3/uL (ref 0.0–0.5)
Eosinophils Relative: 7 %
HCT: 34.8 % — ABNORMAL LOW (ref 36.0–46.0)
Hemoglobin: 11.6 g/dL — ABNORMAL LOW (ref 12.0–15.0)
Immature Granulocytes: 3 %
Lymphocytes Relative: 19 %
Lymphs Abs: 1.1 10*3/uL (ref 0.7–4.0)
MCH: 29.8 pg (ref 26.0–34.0)
MCHC: 33.3 g/dL (ref 30.0–36.0)
MCV: 89.5 fL (ref 80.0–100.0)
Monocytes Absolute: 0.6 10*3/uL (ref 0.1–1.0)
Monocytes Relative: 11 %
Neutro Abs: 3.5 10*3/uL (ref 1.7–7.7)
Neutrophils Relative %: 59 %
Platelet Count: 297 10*3/uL (ref 150–400)
RBC: 3.89 MIL/uL (ref 3.87–5.11)
RDW: 22 % — ABNORMAL HIGH (ref 11.5–15.5)
WBC Count: 5.9 10*3/uL (ref 4.0–10.5)
nRBC: 0 % (ref 0.0–0.2)

## 2022-02-14 LAB — CMP (CANCER CENTER ONLY)
ALT: 12 U/L (ref 0–44)
AST: 22 U/L (ref 15–41)
Albumin: 4 g/dL (ref 3.5–5.0)
Alkaline Phosphatase: 66 U/L (ref 38–126)
Anion gap: 7 (ref 5–15)
BUN: 26 mg/dL — ABNORMAL HIGH (ref 8–23)
CO2: 26 mmol/L (ref 22–32)
Calcium: 9.4 mg/dL (ref 8.9–10.3)
Chloride: 102 mmol/L (ref 98–111)
Creatinine: 1.09 mg/dL — ABNORMAL HIGH (ref 0.44–1.00)
GFR, Estimated: 50 mL/min — ABNORMAL LOW (ref >60)
Glucose, Bld: 83 mg/dL (ref 70–99)
Potassium: 4.2 mmol/L (ref 3.5–5.1)
Sodium: 135 mmol/L (ref 135–145)
Total Bilirubin: 0.4 mg/dL (ref 0.3–1.2)
Total Protein: 7 g/dL (ref 6.5–8.1)

## 2022-02-14 LAB — FERRITIN: Ferritin: 157 ng/mL (ref 11–307)

## 2022-02-14 NOTE — Progress Notes (Cosign Needed Addendum)
Subjective:   Hailey [name] is a 87 y.o. female who presents for Medicare Annual (Subsequent) preventive examination.  I connected with  Hailey [name] on 02/14/22 by a  telephone enabled telemedicine application and verified that I am speaking with the correct person using two identifiers.   I discussed the limitations of evaluation and management by telemedicine. The patient expressed understanding and agreed to proceed.  Patient location: home  Provider location: Tele-Health-home    Review of Systems     Cardiac Risk Factors include: advanced age (>70mn, >>51women);hypertension;sedentary lifestyle     Objective:    Today's Vitals   02/14/22 1031  PainSc: 5    There is no height or weight on file to calculate BMI.     02/14/2022   10:34 AM 11/19/2021    8:58 PM 08/24/2021    8:00 AM 07/23/2021   12:09 PM 02/09/2021    1:59 PM 08/27/2020    4:59 PM 07/01/2020   11:19 AM  Advanced Directives  Does Patient Have a Medical Advance Directive? Yes Yes Yes No Yes Yes No  Type of AParamedicof [place] will [place] will      Does patient want to make changes to medical advance directive?     No - Patient declined    Copy of [place] Chart? No - copy requested        Would patient like information on creating a medical advance directive?    No - Patient declined   No - Patient declined    Current Medications (verified) Outpatient Encounter Medications as of 02/14/2022  Medication Sig   acetaminophen (TYLENOL) 500 MG tablet Take 1,000 mg by mouth every 6 (six) hours as needed (body aches / sore hip). Maximum of 6 tablets daily ('3000mg'$ )   atorvastatin (LIPITOR) 40 MG tablet Take 1 tablet (40 mg total) by mouth every Monday, Wednesday, and Friday.   BIOTIN PO Take 5,000 mcg by mouth in the morning.   Cholecalciferol (VITAMIN D3) 50 MCG (2000 UT) capsule Take 1 capsule  (2,000 Units total) by mouth daily.   clobetasol ointment (TEMOVATE) 06.96% Apply 1 application topically to affected area twice daily for 2 weeks and then reduce to twice a week at night.   Cyanocobalamin (VITAMIN B-12) 1000 MCG SUBL Place 1 tablet (1,000 mcg total) under the tongue daily.   diclofenac Sodium (VOLTAREN) 1 % GEL Apply 2 g topically 4 (four) times daily as needed (back pain.).   dicyclomine (BENTYL) 20 MG tablet Take 1 by mouth every 4-6 hours as needed for cramping   DULoxetine (CYMBALTA) 60 MG capsule Take 1 capsule (60 mg total) by mouth daily. Overdue for annual physical with labs   ELIQUIS 2.5 MG TABS tablet TAKE 1 TABLET BY MOUTH TWICE DAILY.   empagliflozin (JARDIANCE) 10 MG TABS tablet Take 1 tablet (10 mg total) by mouth daily before breakfast.   esomeprazole (NEXIUM) 40 MG capsule TAKE ONE CAPSULE ONCE DAILY.   Homeopathic Products (ARNICARE) GEL Apply 1 application topically daily as needed (skin bruising).   hyoscyamine (LEVSIN) 0.125 MG tablet TAKE 1-2 TABLETS (0.125-0.25 MG) BY MOUTH EVERY 4 HOURS AS NEEDED FOR UP TO 10 DAYS FOR CRAMPING. (Patient taking differently: Take 0.125-0.25 mg by mouth every 4 (four) hours as needed for cramping.)   LORazepam (ATIVAN) 1 MG tablet TAKE ONE TABLET BY MOUTH TWICE DAILY AS NEEDED FOR ANXIETY /  SLEEP. (Patient taking differently: Take 1-1.5 mg by mouth at bedtime.)   losartan (COZAAR) 25 MG tablet TAKE 1 TABLET ONCE DAILY. (Patient taking differently: Take 25 mg by mouth daily.)   metoprolol succinate (TOPROL-XL) 25 MG 24 hr tablet Take 1 tablet (25 mg total) by mouth daily.   Multiple Vitamins-Minerals (PRESERVISION AREDS 2+MULTI VIT) CAPS Take 1 capsule by mouth 2 (two) times daily.   nitroGLYCERIN (NITROSTAT) 0.4 MG SL tablet Place 0.4 mg under the tongue every 5 (five) minutes as needed for chest pain (Call 911 at 3rd dose within 15 minutes.).   ondansetron (ZOFRAN-ODT) 4 MG disintegrating tablet DISSOLVE 1 TABLET IN MOUTH EVERY  8 HOURS AS NEEDED FOR NAUSEA OR VOMITING.   Peppermint Oil (IBGARD) 90 MG CPCR Take 1 capsule before meals (Patient taking differently: Take 1 capsule by mouth 2 (two) times daily before a meal.)   Polyethyl Glycol-Propyl Glycol (LUBRICANT EYE DROPS) 0.4-0.3 % SOLN Place 1-2 drops into both eyes 3 (three) times daily as needed (dry/irritated eyes.).   polyethylene glycol (MIRALAX / GLYCOLAX) 17 g packet Take 17 g by mouth daily.   saccharomyces boulardii (FLORASTOR) 250 MG capsule Take 1 capsule (250 mg total) by mouth 2 (two) times daily. (Patient taking differently: Take 250 mg by mouth daily.)   sodium chloride (OCEAN) 0.65 % SOLN nasal spray Place 1 spray into both nostrils at bedtime as needed for congestion.   spironolactone (ALDACTONE) 25 MG tablet Take 0.5 tablets (12.5 mg total) by mouth daily. (Patient taking differently: Take 12.5 mg by mouth every Monday, Wednesday, and Friday.)   terconazole (TERAZOL 7) 0.4 % vaginal cream Place 1 applicator vaginally at bedtime. One applicator full QHS for seven days of therapy   No facility-administered encounter medications on file as of 02/14/2022.    Allergies (verified) Macrobid [nitrofurantoin monohyd macro], Meloxicam, Digoxin and related, Ferrous sulfate, Keflex [cephalexin], Mobic [meloxicam], and Sulfamethoxazole-trimethoprim   History: Past Medical History:  Diagnosis Date   Acute blood loss anemia 12/25/2017   Allergic rhinitis    Anxiety    Arthritis    "my whole spine" (07/01/2017)   Atrial fibrillation (HCC)    Bowel obstruction (HCC)    in [place]   Bradycardia with 41-50 beats per minute 12/27/2017   Cancer ([place])    Cellulitis of leg, right with large prepatella hematoma and open wounds 12/26/2017   CKD (chronic kidney disease) stage 2, GFR 60-89 ml/min    Colon polyps    Coronary artery disease    10/18 PCI/DES to p/m LCx with cutting balloon to mLcx   Diverticulosis of colon    GERD (gastroesophageal reflux disease)     Hip bursitis 2010   Dr Para March, Post op seroma   History of colon polyps    HSV (herpes simplex virus) anogenital infection 07/2019   HTN (hypertension)    IBS (irritable bowel syndrome)    constipation predominant - Dr Earlean Shawl   Lichen sclerosus    Osteopenia 11/2016   T score -2.0 FRAX 15%/4.3%   PAC (premature atrial contraction)    Symptomatiic   Renal insufficiency    Right bundle branch block (RBBB) on electrocardiogram (ECG) 12/27/2017   Scoliosis    SVT (supraventricular tachycardia) ([place])    brief history   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   biopsy showing vulvar atypia, possible VIN I   Past Surgical History:  Procedure Laterality Date   ANTERIOR AND POSTERIOR VAGINAL REPAIR  01/2002   Archie Endo 01/23/2011  APPENDECTOMY  1948   CARDIAC CATHETERIZATION  06/26/2017   CORONARY ANGIOPLASTY WITH STENT PLACEMENT  07/01/2017   CORONARY ATHERECTOMY N/A 07/01/2017   Procedure: CORONARY ATHERECTOMY;  Surgeon: Belva Crome, MD;  Location: [facility];  Service: Cardiovascular;  Laterality: N/A;   CORONARY STENT INTERVENTION N/A 07/01/2017   Procedure: CORONARY STENT INTERVENTION;  Surgeon: Belva Crome, MD;  Location: [facility];  Service: Cardiovascular;  Laterality: N/A;   HAMMER TOE SURGERY     HEMORRHOID BANDING     HIP SURGERY Left 04/2009   hip examination under anesthesia followed by greater trochanteric bursectomy; iliotibial band tenotomy/notes 01/20/2011   I & D EXTREMITY Right 01/10/2018   Procedure: IRRIGATION AND DEBRIDEMENT RIGHT KNEE, APPLY WOUND VAC;  Surgeon: Newt Minion, MD;  Location: [facility];  Service: Orthopedics;  Laterality: Right;   KNEE BURSECTOMY Right 04/2009   Archie Endo 01/09/2011   LEFT ATRIAL APPENDAGE OCCLUSION N/A 08/24/2021   Procedure: LEFT ATRIAL APPENDAGE OCCLUSION;  Surgeon: Sherren Mocha, MD;  Location: [facility];  Service: Cardiovascular;  Laterality: N/A;   LEFT HEART CATH AND CORONARY ANGIOGRAPHY  N/A 06/26/2017   Procedure: LEFT HEART CATH AND CORONARY ANGIOGRAPHY;  Surgeon: Belva Crome, MD;  Location: [facility];  Service: Cardiovascular;  Laterality: N/A;   PUBOVAGINAL SLING  01/2002   Archie Endo 01/23/2011   REDUCTION MAMMAPLASTY     TEE WITHOUT CARDIOVERSION N/A 08/24/2021   Procedure: TRANSESOPHAGEAL ECHOCARDIOGRAM (TEE);  Surgeon: Sherren Mocha, MD;  Location: [facility];  Service: Cardiovascular;  Laterality: N/A;   TEMPORARY PACEMAKER N/A 07/01/2017   Procedure: TEMPORARY PACEMAKER;  Surgeon: Belva Crome, MD;  Location: [facility];  Service: Cardiovascular;  Laterality: N/A;   VAGINAL HYSTERECTOMY  01/2002   Vaginal hysterectomy, bilateral salpingo-oophorectomy/notes 01/23/2011   Family History  Problem Relation Age of Onset   Colon cancer Mother        Dx age 26, died at age 1   Diabetes Father    Prostate cancer Father    Prostate cancer Brother    Pancreatic cancer Brother    Stomach cancer Son    Heart attack Neg Hx    Stroke Neg Hx    Esophageal cancer Neg Hx    Social History   Socioeconomic History   Marital status: Married    Spouse name: [name]   Number of children: 2   Years of education: Not on file   Highest education level: Not on file  Occupational History   Occupation: Patent attorney: RETIRED  Tobacco Use   Smoking status: Former    Packs/day: 0.25    Years: 28.00    Pack years: 7.00    Types: Cigarettes    Quit date: 1981    Years since quitting: 42.4   Smokeless tobacco: Never  Vaping Use   Vaping Use: Never used  Substance and Sexual Activity   Alcohol use: Yes    Comment: 7 vodka drinks a week   Drug use: Never   Sexual activity: Not Currently    Birth control/protection: Surgical    Comment: hysterectomy  Other Topics Concern   Not on file  Social History Narrative   Regular Exercise -  YES         Social Determinants of Health   Financial Resource Strain: Low Risk    Difficulty  of Paying Living Expenses: Not hard at all  Food Insecurity: No  Food Insecurity   Worried About Charity fundraiser in the Last Year: Never true   Ran Out of Food in the Last Year: Never true  Transportation Needs: No Transportation Needs   Lack of Transportation (Medical): No   Lack of Transportation (Non-Medical): No  Physical Activity: Inactive   Days of Exercise per Week: 0 days   Minutes of Exercise per Session: 0 min  Stress: No Stress Concern Present   Feeling of Stress : Only a little  Social Connections: Moderately Integrated   Frequency of Communication with Friends and Family: More than three times a week   Frequency of Social Gatherings with Friends and Family: Three times a week   Attends Religious Services: More than 4 times per year   Active Member of Clubs or Organizations: No   Attends Archivist Meetings: Never   Marital Status: Married    Tobacco Counseling Counseling given: Not Answered   Clinical Intake:  Pre-visit preparation completed: Yes  Pain : 0-10 Pain Score: 5  Pain Location: Leg Pain Orientation: Right Pain Descriptors / Indicators: Burning, Aching Pain Onset: More than a month ago Pain Frequency: Intermittent Pain Relieving Factors: tylenol  Pain Relieving Factors: tylenol  Nutritional Risks: None Diabetes: No  How often do you need to have someone help you when you read instructions, pamphlets, or other written materials from your doctor or pharmacy?: 1 - Never  Diabetic?  Interpreter Needed?: No  Information entered by :: Leroy Kennedy LPN   Activities of Daily Living    02/14/2022   10:46 AM 08/24/2021    8:14 AM  In your present state of health, do you have any difficulty performing the following activities:  Hearing? 1   Vision? 0   Difficulty concentrating or making decisions? 0   Walking or climbing stairs? 0   Dressing or bathing? 0   Doing errands, shopping? 0 0  Preparing Food and eating ? N   Using the  Toilet? N   In the past six months, have you accidently leaked urine? N   Do you have problems with loss of bowel control? N   Managing your Medications? N   Managing your Finances? N   Housekeeping or managing your Housekeeping? N     Patient Care Team: Plotnikov, Evie Lacks, MD as PCP - General Tamala Julian Lynnell Dike, MD as PCP - Cardiology (Cardiology) Vickie Epley, MD as PCP - Electrophysiology (Cardiology) Irene Shipper, MD as Consulting Physician (Gastroenterology) Darleen Crocker, MD as Consulting Physician (Ophthalmology) Delice Bison, Darnelle Maffucci, Detar North as Pharmacist (Pharmacist) Brunetta Genera, MD as Consulting Physician (Hematology)  Indicate any recent Medical Services you may have received from other than [facility] providers in the past year (date may be approximate).     Assessment:   This is a routine wellness examination for Lakira.  Hearing/Vision screen Hearing Screening - Comments:: Hearing issues out of R ear No hearing aids Vision Screening - Comments:: Dr. Idolina Primer Up to date  Dietary issues and exercise activities discussed: Current Exercise Habits: The patient does not participate in regular exercise at present, Exercise limited by: None identified   Goals Addressed             This Visit's Progress    Increase physical activity         Depression Screen    02/14/2022   10:36 AM 02/09/2021    2:00 PM 12/03/2017    4:53 PM 08/05/2017    5:08 PM 07/11/2017  10:02 AM 05/29/2016    2:06 PM 12/08/2015    3:54 PM  PHQ 2/9 Scores  PHQ - 2 Score 1 0 0 0 1 0 0  PHQ- 9 Score 3        Exception Documentation       Other- indicate reason in comment box    Fall Risk    02/14/2022   10:32 AM 02/09/2021    2:00 PM 07/11/2017   10:02 AM 05/29/2016    2:06 PM 12/08/2015    3:54 PM  [place] in the past year? 0 0 No No No  Number falls in past yr: 0 0     Injury with Fall? 0 0     Risk for fall due to :  No Fall Risks   Other (Comment)  Follow up Falls  evaluation completed;Falls prevention discussed;Education provided Falls evaluation completed       FALL RISK PREVENTION PERTAINING TO THE HOME:  Any stairs in or around the home? Yes  If so, are there any without handrails? No  Home free of loose throw rugs in walkways, pet beds, electrical cords, etc? Yes  Adequate lighting in your home to reduce risk of falls? Yes   ASSISTIVE DEVICES UTILIZED TO PREVENT FALLS:  Life alert? No  Use of a cane, walker or w/c? No  Grab bars in the bathroom? Yes  Shower chair or bench in shower? Yes  Elevated toilet seat or a handicapped toilet? No   TIMED UP AND GO:  Was the test performed? No .    Cognitive Function:        02/14/2022   10:33 AM  6CIT Screen  What Year? 0 points  What month? 0 points  What time? 0 points  Count back from 20 0 points  Months in reverse 0 points  Repeat phrase 0 points  Total Score 0 points    Immunizations Immunization History  Administered Date(s) Administered   Influenza Split 06/18/2012   Influenza Whole 05/26/2008, 06/05/2010   Influenza, High Dose Seasonal PF 06/03/2014, 06/12/2017, 05/22/2018, 05/14/2019, 06/07/2020   Influenza,inj,Quad PF,6+ Mos 06/09/2015   Influenza-Unspecified 06/08/2013   PFIZER(Purple Top)SARS-COV-2 Vaccination 09/24/2019, 10/15/2019, 06/07/2020   Pneumococcal Conjugate-13 01/25/2014   Pneumococcal Polysaccharide-23 11/07/2018   Td 06/11/2007, 06/17/2007   Tdap 12/22/2017   Zoster, Live 12/01/2008    TDAP status: Up to date  Flu Vaccine status: Up to date  Pneumococcal vaccine status: Up to date  Covid-19 vaccine status: Information provided on how to obtain vaccines.   Qualifies for Shingles Vaccine? Yes   Zostavax completed Yes   Shingrix Completed?: No.    Education has been provided regarding the importance of this vaccine. Patient has been advised to call insurance company to determine out of pocket expense if they have not yet received this vaccine.  Advised may also receive vaccine at local pharmacy or Health Dept. Verbalized acceptance and understanding.  Screening Tests Health Maintenance  Topic Date Due   COVID-19 Vaccine (5 - Booster for Pfizer series) 03/02/2022 (Originally 04/18/2021)   Zoster Vaccines- Shingrix (1 of 2) 05/17/2022 (Originally 11/24/1953)   INFLUENZA VACCINE  04/10/2022   TETANUS/TDAP  12/23/2027   Pneumonia Vaccine 59+ Years old  Completed   DEXA SCAN  Completed   HPV VACCINES  Aged Out    Health Maintenance  There are no preventive care reminders to display for this patient.   Colorectal cancer screening: No longer required.   Mammogram status:  Completed  . Repeat every year  Bone Density status: Completed 2022. Results reflect: Bone density results: OSTEOPOROSIS. Repeat every 2 years.  Lung Cancer Screening: (Low Dose CT Chest recommended if Age 8-80 years, 30 pack-year currently smoking OR have quit w/in 15years.) does not qualify.   Lung Cancer Screening Referral:   Additional Screening:  Hepatitis C Screening: does not qualify;   Vision Screening: Recommended annual ophthalmology exams for early detection of glaucoma and other disorders of the eye. Is the patient up to date with their annual eye exam?  No  Who is the provider or what is the name of the office in which the patient attends annual eye exams? [facility] If pt is not established with a provider, would they like to be referred to a provider to establish care? No .   Dental Screening: Recommended annual dental exams for proper oral hygiene  Community Resource Referral / Chronic Care Management: CRR required this visit?  No   CCM required this visit?  No      Plan:     I have personally reviewed and noted the following in the patient's chart:   Medical and social history Use of alcohol, tobacco or illicit drugs  Current medications and supplements including opioid prescriptions.  Functional ability and status Nutritional  status Physical activity Advanced directives List of other physicians Hospitalizations, surgeries, and ER visits in previous 12 months Vitals Screenings to include cognitive, depression, and falls Referrals and appointments  In addition, I have reviewed and discussed with patient certain preventive protocols, quality metrics, and best practice recommendations. A written personalized care plan for preventive services as well as general preventive health recommendations were provided to patient.     Leroy Kennedy, LPN   05/18/9210   Nurse Notes:    Medical screening examination/treatment/procedure(s) were performed by non-physician practitioner and as supervising physician I was immediately available for consultation/collaboration.  I agree with above. Lew Dawes, MD

## 2022-02-14 NOTE — Progress Notes (Signed)
HEMATOLOGY/ONCOLOGY CLINIC NOTE  Date of Service: 02/14/2022  Patient Care Team: Cassandria Anger, MD as PCP - General Tamala Julian Lynnell Dike, MD as PCP - Cardiology (Cardiology) Vickie Epley, MD as PCP - Electrophysiology (Cardiology) Irene Shipper, MD as Consulting Physician (Gastroenterology) Darleen Crocker, MD as Consulting Physician (Ophthalmology) Delice Bison, Darnelle Maffucci, [facility] as Pharmacist (Pharmacist) Brunetta Genera, MD as Consulting Physician (Hematology)  CHIEF COMPLAINTS/PURPOSE OF CONSULTATION:  Management of iron deficiency anemia  HISTORY OF PRESENTING ILLNESS:  Please see previous note for details on initial presentation  INTERVAL HISTORY:  Hailey [name] is a 87 y.o. female with history of mild factor XI  deficiency here for evaluation and management of iron deficiency anemia. She reports She is doing well with no new symptoms or concerns.  She reports B/L venous stasis dermatitis in lower extremities.  She continues to follow up with gastroenterology Dr. Scarlette Shorts MD for optimization of GI treatment and management of symptoms.  She reports that she is having a tooth pulled soon.  She is tolerating the IV Injectafer well with no prohibitive toxicities.  No new or abnormal bleeding. No melena no hematochezia. No other new or acute focal symptoms.  Labs done today were reviewed in detail. Iron saturation was noted to be 21%.  CBC with hemoglobin of 11.6 with an MCV of 89.5. CMP shows elevated BUN of 26 consistent with dehydration. Elevated creatinine of 1.09 and reduced GFR est of 50 consistent with chronic kidney disease. Ferritin WNL.  MEDICAL HISTORY:  Past Medical History:  Diagnosis Date   Acute blood loss anemia 12/25/2017   Allergic rhinitis    Anxiety    Arthritis    "my whole spine" (07/01/2017)   Atrial fibrillation (HCC)    Bowel obstruction (HCC)    in [place]   Bradycardia with 41-50 beats per minute 12/27/2017   Cancer ([place])     Cellulitis of leg, right with large prepatella hematoma and open wounds 12/26/2017   CKD (chronic kidney disease) stage 2, GFR 60-89 ml/min    Colon polyps    Coronary artery disease    10/18 PCI/DES to p/m LCx with cutting balloon to mLcx   Diverticulosis of colon    GERD (gastroesophageal reflux disease)    Hip bursitis 2010   Dr Para March, Post op seroma   History of colon polyps    HSV (herpes simplex virus) anogenital infection 07/2019   HTN (hypertension)    IBS (irritable bowel syndrome)    constipation predominant - Dr Earlean Shawl   Lichen sclerosus    Osteopenia 11/2016   T score -2.0 FRAX 15%/4.3%   PAC (premature atrial contraction)    Symptomatiic   Renal insufficiency    Right bundle branch block (RBBB) on electrocardiogram (ECG) 12/27/2017   Scoliosis    SVT (supraventricular tachycardia) ([place])    brief history   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   biopsy showing vulvar atypia, possible VIN I    SURGICAL HISTORY: Past Surgical History:  Procedure Laterality Date   ANTERIOR AND POSTERIOR VAGINAL REPAIR  01/2002   Archie Endo 01/23/2011   APPENDECTOMY  1948   CARDIAC CATHETERIZATION  06/26/2017   CORONARY ANGIOPLASTY WITH STENT PLACEMENT  07/01/2017   CORONARY ATHERECTOMY N/A 07/01/2017   Procedure: CORONARY ATHERECTOMY;  Surgeon: Belva Crome, MD;  Location: [facility];  Service: Cardiovascular;  Laterality: N/A;   CORONARY STENT INTERVENTION N/A 07/01/2017   Procedure: CORONARY STENT INTERVENTION;  Surgeon:  Belva Crome, MD;  Location: [facility];  Service: Cardiovascular;  Laterality: N/A;   HAMMER TOE SURGERY     HEMORRHOID BANDING     HIP SURGERY Left 04/2009   hip examination under anesthesia followed by greater trochanteric bursectomy; iliotibial band tenotomy/notes 01/20/2011   I & D EXTREMITY Right 01/10/2018   Procedure: IRRIGATION AND DEBRIDEMENT RIGHT KNEE, APPLY WOUND VAC;  Surgeon: Newt Minion, MD;  Location: [facility];   Service: Orthopedics;  Laterality: Right;   KNEE BURSECTOMY Right 04/2009   Archie Endo 01/09/2011   LEFT ATRIAL APPENDAGE OCCLUSION N/A 08/24/2021   Procedure: LEFT ATRIAL APPENDAGE OCCLUSION;  Surgeon: Sherren Mocha, MD;  Location: [facility];  Service: Cardiovascular;  Laterality: N/A;   LEFT HEART CATH AND CORONARY ANGIOGRAPHY N/A 06/26/2017   Procedure: LEFT HEART CATH AND CORONARY ANGIOGRAPHY;  Surgeon: Belva Crome, MD;  Location: [facility];  Service: Cardiovascular;  Laterality: N/A;   PUBOVAGINAL SLING  01/2002   Archie Endo 01/23/2011   REDUCTION MAMMAPLASTY     TEE WITHOUT CARDIOVERSION N/A 08/24/2021   Procedure: TRANSESOPHAGEAL ECHOCARDIOGRAM (TEE);  Surgeon: Sherren Mocha, MD;  Location: [facility];  Service: Cardiovascular;  Laterality: N/A;   TEMPORARY PACEMAKER N/A 07/01/2017   Procedure: TEMPORARY PACEMAKER;  Surgeon: Belva Crome, MD;  Location: [facility];  Service: Cardiovascular;  Laterality: N/A;   VAGINAL HYSTERECTOMY  01/2002   Vaginal hysterectomy, bilateral salpingo-oophorectomy/notes 01/23/2011    SOCIAL HISTORY: Social History   Socioeconomic History   Marital status: Married    Spouse name: [name]   Number of children: 2   Years of education: Not on file   Highest education level: Not on file  Occupational History   Occupation: Patent attorney: RETIRED  Tobacco Use   Smoking status: Former    Packs/day: 0.25    Years: 28.00    Pack years: 7.00    Types: Cigarettes    Quit date: 1981    Years since quitting: 42.4   Smokeless tobacco: Never  Vaping Use   Vaping Use: Never used  Substance and Sexual Activity   Alcohol use: Yes    Comment: 7 vodka drinks a week   Drug use: Never   Sexual activity: Not Currently    Birth control/protection: Surgical    Comment: hysterectomy  Other Topics Concern   Not on file  Social History Narrative   Regular Exercise -  YES         Social Determinants of Health    Financial Resource Strain: Low Risk    Difficulty of Paying Living Expenses: Not hard at all  Food Insecurity: No Food Insecurity   Worried About Charity fundraiser in the Last Year: Never true   Ran Out of Food in the Last Year: Never true  Transportation Needs: No Transportation Needs   Lack of Transportation (Medical): No   Lack of Transportation (Non-Medical): No  Physical Activity: Inactive   Days of Exercise per Week: 0 days   Minutes of Exercise per Session: 0 min  Stress: No Stress Concern Present   Feeling of Stress : Only a little  Social Connections: Moderately Integrated   Frequency of Communication with Friends and Family: More than three times a week   Frequency of Social Gatherings with Friends and Family: Three times a week   Attends Religious Services: More than 4 times per year   Active Member of Clubs or  Organizations: No   Attends Music therapist: Never   Marital Status: Married  Human resources officer Violence: Not At Risk   Fear of Current or Ex-Partner: No   Emotionally Abused: No   Physically Abused: No   Sexually Abused: No    FAMILY HISTORY: Family History  Problem Relation Age of Onset   Colon cancer Mother        Dx age 36, died at age 2   Diabetes Father    Prostate cancer Father    Prostate cancer Brother    Pancreatic cancer Brother    Stomach cancer Son    Heart attack Neg Hx    Stroke Neg Hx    Esophageal cancer Neg Hx     ALLERGIES:  is allergic to macrobid [nitrofurantoin monohyd macro], meloxicam, digoxin and related, ferrous sulfate, keflex [cephalexin], mobic [meloxicam], and sulfamethoxazole-trimethoprim.  MEDICATIONS:  Current Outpatient Medications  Medication Sig Dispense Refill   acetaminophen (TYLENOL) 500 MG tablet Take 1,000 mg by mouth every 6 (six) hours as needed (body aches / sore hip). Maximum of 6 tablets daily ('3000mg'$ )     atorvastatin (LIPITOR) 40 MG tablet Take 1 tablet (40 mg total) by mouth every  Monday, Wednesday, and Friday. 45 tablet 3   BIOTIN PO Take 5,000 mcg by mouth in the morning.     Cholecalciferol (VITAMIN D3) 50 MCG (2000 UT) capsule Take 1 capsule (2,000 Units total) by mouth daily. 100 capsule 3   clobetasol ointment (TEMOVATE) 5.73 % Apply 1 application topically to affected area twice daily for 2 weeks and then reduce to twice a week at night. 30 g 0   Cyanocobalamin (VITAMIN B-12) 1000 MCG SUBL Place 1 tablet (1,000 mcg total) under the tongue daily. 100 tablet 3   diclofenac Sodium (VOLTAREN) 1 % GEL Apply 2 g topically 4 (four) times daily as needed (back pain.).     dicyclomine (BENTYL) 20 MG tablet Take 1 by mouth every 4-6 hours as needed for cramping 30 tablet 3   DULoxetine (CYMBALTA) 60 MG capsule Take 1 capsule (60 mg total) by mouth daily. Overdue for annual physical with labs 30 capsule 2   ELIQUIS 2.5 MG TABS tablet TAKE 1 TABLET BY MOUTH TWICE DAILY. 180 tablet 1   empagliflozin (JARDIANCE) 10 MG TABS tablet Take 1 tablet (10 mg total) by mouth daily before breakfast. 90 tablet 3   esomeprazole (NEXIUM) 40 MG capsule TAKE ONE CAPSULE ONCE DAILY. 90 capsule 1   Homeopathic Products (ARNICARE) GEL Apply 1 application topically daily as needed (skin bruising).     hyoscyamine (LEVSIN) 0.125 MG tablet TAKE 1-2 TABLETS (0.125-0.25 MG) BY MOUTH EVERY 4 HOURS AS NEEDED FOR UP TO 10 DAYS FOR CRAMPING. (Patient taking differently: Take 0.125-0.25 mg by mouth every 4 (four) hours as needed for cramping.) 100 tablet 1   LORazepam (ATIVAN) 1 MG tablet TAKE ONE TABLET BY MOUTH TWICE DAILY AS NEEDED FOR ANXIETY / SLEEP. (Patient taking differently: Take 1-1.5 mg by mouth at bedtime.) 180 tablet 1   losartan (COZAAR) 25 MG tablet TAKE 1 TABLET ONCE DAILY. (Patient taking differently: Take 25 mg by mouth daily.) 90 tablet 3   metoprolol succinate (TOPROL-XL) 25 MG 24 hr tablet Take 1 tablet (25 mg total) by mouth daily. 90 tablet 3   Multiple Vitamins-Minerals (PRESERVISION  AREDS 2+MULTI VIT) CAPS Take 1 capsule by mouth 2 (two) times daily.     nitroGLYCERIN (NITROSTAT) 0.4 MG SL tablet Place 0.4 mg under  the tongue every 5 (five) minutes as needed for chest pain (Call 911 at 3rd dose within 15 minutes.).     ondansetron (ZOFRAN-ODT) 4 MG disintegrating tablet DISSOLVE 1 TABLET IN MOUTH EVERY 8 HOURS AS NEEDED FOR NAUSEA OR VOMITING. 20 tablet 0   Peppermint Oil (IBGARD) 90 MG CPCR Take 1 capsule before meals (Patient taking differently: Take 1 capsule by mouth 2 (two) times daily before a meal.)     Polyethyl Glycol-Propyl Glycol (LUBRICANT EYE DROPS) 0.4-0.3 % SOLN Place 1-2 drops into both eyes 3 (three) times daily as needed (dry/irritated eyes.).     polyethylene glycol (MIRALAX / GLYCOLAX) 17 g packet Take 17 g by mouth daily.     saccharomyces boulardii (FLORASTOR) 250 MG capsule Take 1 capsule (250 mg total) by mouth 2 (two) times daily. (Patient taking differently: Take 250 mg by mouth daily.) 60 capsule 2   sodium chloride (OCEAN) 0.65 % SOLN nasal spray Place 1 spray into both nostrils at bedtime as needed for congestion.     spironolactone (ALDACTONE) 25 MG tablet Take 0.5 tablets (12.5 mg total) by mouth daily. (Patient taking differently: Take 12.5 mg by mouth every Monday, Wednesday, and Friday.) 45 tablet 3   terconazole (TERAZOL 7) 0.4 % vaginal cream Place 1 applicator vaginally at bedtime. One applicator full QHS for seven days of therapy 45 g 0   No current facility-administered medications for this visit.    REVIEW OF SYSTEMS:    10 Point review of Systems was done is negative except as noted above.  PHYSICAL EXAMINATION:  .BP 130/82 (BP Location: Left Arm, Patient Position: Sitting)   Pulse (!) 54   Temp 97.8 F (36.6 C) (Temporal)   Resp 18   Ht 5' 3.5" (1.613 m)   Wt 138 lb 1.6 oz (62.6 kg)   SpO2 95%   BMI 24.08 kg/m  NAD GENERAL:alert, in no acute distress and comfortable SKIN: no acute rashes, no significant lesions EYES:  conjunctiva are pink and non-injected, sclera anicteric NECK: supple, no JVD LYMPH:  no palpable lymphadenopathy in the cervical, axillary or inguinal regions LUNGS: clear to auscultation b/l with normal respiratory effort HEART: regular rate & rhythm ABDOMEN:  normoactive bowel sounds , non tender, not distended. Extremity: no pedal edema PSYCH: alert & oriented x 3 with fluent speech NEURO: no focal motor/sensory deficits  LABORATORY DATA:  I have reviewed the data as listed  .    Latest Ref Rng & Units 02/14/2022   11:38 AM 11/20/2021    5:00 AM 11/19/2021    9:47 PM  CBC  WBC 4.0 - 10.5 K/uL 5.9   7.2   9.3    Hemoglobin 12.0 - 15.0 g/dL 11.6   8.7   8.8    Hematocrit 36.0 - 46.0 % 34.8   27.4   28.1    Platelets 150 - 400 K/uL 297   279   310      .    Latest Ref Rng & Units 02/14/2022   11:38 AM 01/29/2022   10:06 AM 11/20/2021    5:00 AM  CMP  Glucose 70 - 99 mg/dL 83    107    BUN 8 - 23 mg/dL 26    18    Creatinine 0.44 - 1.00 mg/dL 1.09    1.06    Sodium 135 - 145 mmol/L 135    134    Potassium 3.5 - 5.1 mmol/L 4.2    3.8  Chloride 98 - 111 mmol/L 102    107    CO2 22 - 32 mmol/L 26    21    Calcium 8.9 - 10.3 mg/dL 9.4    8.5    Total Protein 6.5 - 8.1 g/dL 7.0   6.4     Total Bilirubin 0.3 - 1.2 mg/dL 0.4   0.5     Alkaline Phos 38 - 126 U/L 66   72     AST 15 - 41 U/L 22   20     ALT 0 - 44 U/L 12   14      Component     Latest Ref Rng & Units 12/02/2019  Prothrombin Time     11.4 - 15.2 seconds 13.9  INR     0.8 - 1.2 1.1  Factor XI Activity     60 - 150 % 48 (L)  APTT     24 - 36 seconds 52 (H)  . Lab Results  Component Value Date   IRON 67 02/14/2022   TIBC 323 02/14/2022   IRONPCTSAT 21 02/14/2022   (Iron and TIBC)  Lab Results  Component Value Date   FERRITIN 157 02/14/2022     RADIOGRAPHIC STUDIES: I have personally reviewed the radiological images as listed and agreed with the findings in the report. MM DIAG BREAST TOMO UNI  RIGHT  Result Date: 02/08/2022 CLINICAL DATA:  Screening recall for possible right breast asymmetry. EXAM: DIGITAL DIAGNOSTIC UNILATERAL RIGHT MAMMOGRAM WITH TOMOSYNTHESIS AND CAD TECHNIQUE: Right digital diagnostic mammography and breast tomosynthesis was performed. The images were evaluated with computer-aided detection. COMPARISON:  Previous exam(s). ACR Breast Density Category c: The breast tissue is heterogeneously dense, which may obscure small masses. FINDINGS: Additional tomograms were performed of the right breast. The initially questioned possible right breast asymmetry resolves on the additional imaging with findings compatible with an area of overlapping fibroglandular tissue. There is no mammographic evidence of malignancy in the right breast. IMPRESSION: No mammographic evidence of malignancy in the right breast. RECOMMENDATION: Screening mammogram in one year.(Code:SM-B-01Y) I have discussed the findings and recommendations with the patient. If applicable, a reminder letter will be sent to the patient regarding the next appointment. BI-RADS CATEGORY  1: Negative. Electronically Signed   By: Everlean Alstrom M.D.   On: 02/08/2022 10:20  MM 3D SCREEN BREAST BILATERAL  Result Date: 01/30/2022 CLINICAL DATA:  Screening. EXAM: DIGITAL SCREENING BILATERAL MAMMOGRAM WITH TOMOSYNTHESIS AND CAD TECHNIQUE: Bilateral screening digital craniocaudal and mediolateral oblique mammograms were obtained. Bilateral screening digital breast tomosynthesis was performed. The images were evaluated with computer-aided detection. COMPARISON:  Previous exam(s). ACR Breast Density Category c: The breast tissue is heterogeneously dense, which may obscure small masses. FINDINGS: In the right breast, a possible asymmetry warrants further evaluation. In the left breast, no findings suspicious for malignancy. IMPRESSION: Further evaluation is suggested for possible asymmetry in the right breast. RECOMMENDATION: Diagnostic  mammogram and possibly ultrasound of the right breast. (Code:[id number]-R-74M) The patient will be contacted regarding the findings, and additional imaging will be scheduled. BI-RADS CATEGORY  0: Incomplete. Need additional imaging evaluation and/or prior mammograms for comparison. Electronically Signed   By: Marin Olp M.D.   On: 01/30/2022 12:40    ASSESSMENT & PLAN:   87 yo woman of Ashkenazi Jewish ancestry with   1) Mild likely inherited Factor XI deficiency. Mild with FXI activity levels @ 48%   2) Iron deficiency anemia Likely from GI losses.  Patient recently had  acute diverticulitis. Celiac disease has been ruled out previously. -Labs done 02/14/2022 show Iron saturation of 21%.  CBC with hemoglobin of 11.6 with an MCV of 89.5.   PLAN: -Labs done today were reviewed in detail. Iron saturation was noted to be 21%.  CBC with hemoglobin of 11.6 with an MCV of 89.5. Ferritin WNL.[id number] No indication for additional IV Iron at this time. CMP shows elevated BUN of 26 consistent with dehydration. Elevated creatinine of 1.09 and reduced GFR est of 50 consistent with chronic kidney disease. -Patient is already following with Dr. Henrene Pastor and will defer to him regarding need for additional GI work-up at this time. -Recommended she start taking B complex 1 capsule p.o. daily as well given her B vitamin deficiency. -RTC in 6 months with labs.  FOLLOW UP: RTC with Dr Irene Limbo with labs in 6 months   The total time spent in the appointment was 20 minutes*.  All of the patient's questions were answered with apparent satisfaction. The patient knows to call the clinic with any problems, questions or concerns.   Sullivan Lone MD MS AAHIVMS [facility] Hematology/Oncology Physician [facility]  .*Total Encounter Time as defined by the Centers for Medicare and Medicaid Services includes, in addition to the face-to-face time of a patient visit (documented in the note above) non-face-to-face time:  obtaining and reviewing outside history, ordering and reviewing medications, tests or procedures, care coordination (communications with other health care professionals or caregivers) and documentation in the medical record.  I, Melene Muller, am acting as scribe for Dr. Sullivan Lone, MD.  .I have reviewed the above documentation for accuracy and completeness, and I agree with the above. Brunetta Genera MD

## 2022-02-14 NOTE — Patient Instructions (Signed)
Hailey [name] , Thank you for taking time to come for your Medicare Wellness Visit. I appreciate your ongoing commitment to your health goals. Please review the following plan we discussed and let me know if I can assist you in the future.   Screening recommendations/referrals: Colonoscopy:  no longer required Mammogram: up to date Bone Density: up to date Recommended yearly ophthalmology/optometry visit for glaucoma screening and checkup Recommended yearly dental visit for hygiene and checkup  Vaccinations: Influenza vaccine: up to date Pneumococcal vaccine: up to date Tdap vaccine: up to date Shingles vaccine: education provided    Advanced directives: not on file  Conditions/risks identified:   Next appointment: 05-16-2022 @ [facility]    Preventive Care 65 Years and Older, Female Preventive care refers to lifestyle choices and visits with your health care provider that can promote health and wellness. What does preventive care include? A yearly physical exam. This is also called an annual well check. Dental exams once or twice a year. Routine eye exams. Ask your health care provider how often you should have your eyes checked. Personal lifestyle choices, including: Daily care of your teeth and gums. Regular physical activity. Eating a healthy diet. Avoiding tobacco and drug use. Limiting alcohol use. Practicing safe sex. Taking low-dose aspirin every day. Taking vitamin and mineral supplements as recommended by your health care provider. What happens during an annual well check? The services and screenings done by your health care provider during your annual well check will depend on your age, overall health, lifestyle risk factors, and family history of disease. Counseling  Your health care provider may ask you questions about your: Alcohol use. Tobacco use. Drug use. Emotional well-being. Home and relationship well-being. Sexual activity. Eating habits. History of  falls. Memory and ability to understand (cognition). Work and work Statistician. Reproductive health. Screening  You may have the following tests or measurements: Height, weight, and BMI. Blood pressure. Lipid and cholesterol levels. These may be checked every 5 years, or more frequently if you are over 67 years old. Skin check. Lung cancer screening. You may have this screening every year starting at age 56 if you have a 30-pack-year history of smoking and currently smoke or have quit within the past 15 years. Fecal occult blood test (FOBT) of the stool. You may have this test every year starting at age 83. Flexible sigmoidoscopy or colonoscopy. You may have a sigmoidoscopy every 5 years or a colonoscopy every 10 years starting at age 71. Hepatitis C blood test. Hepatitis B blood test. Sexually transmitted disease (STD) testing. Diabetes screening. This is done by checking your blood sugar (glucose) after you have not eaten for a while (fasting). You may have this done every 1-3 years. Bone density scan. This is done to screen for osteoporosis. You may have this done starting at age 37. Mammogram. This may be done every 1-2 years. Talk to your health care provider about how often you should have regular mammograms. Talk with your health care provider about your test results, treatment options, and if necessary, the need for more tests. Vaccines  Your health care provider may recommend certain vaccines, such as: Influenza vaccine. This is recommended every year. Tetanus, diphtheria, and acellular pertussis (Tdap, Td) vaccine. You may need a Td booster every 10 years. Zoster vaccine. You may need this after age 74. Pneumococcal 13-valent conjugate (PCV13) vaccine. One dose is recommended after age 24. Pneumococcal polysaccharide (PPSV23) vaccine. One dose is recommended after age 79. Talk to your health  care provider about which screenings and vaccines you need and how often you need  them. This information is not intended to replace advice given to you by your health care provider. Make sure you discuss any questions you have with your health care provider. Document Released: 09/23/2015 Document Revised: 05/16/2016 Document Reviewed: 06/28/2015 Elsevier Interactive Patient Education  2017 [employer] Prevention in the Home Falls can cause injuries. They can happen to people of all ages. There are many things you can do to make your home safe and to help prevent falls. What can I do on the outside of my home? Regularly fix the edges of walkways and driveways and fix any cracks. Remove anything that might make you trip as you walk through a door, such as a raised step or threshold. Trim any bushes or trees on the path to your home. Use bright outdoor lighting. Clear any walking paths of anything that might make someone trip, such as rocks or tools. Regularly check to see if handrails are loose or broken. Make sure that both sides of any steps have handrails. Any raised decks and porches should have guardrails on the edges. Have any leaves, snow, or ice cleared regularly. Use sand or salt on walking paths during winter. Clean up any spills in your garage right away. This includes oil or grease spills. What can I do in the bathroom? Use night lights. Install grab bars by the toilet and in the tub and shower. Do not use towel bars as grab bars. Use non-skid mats or decals in the tub or shower. If you need to sit down in the shower, use a plastic, non-slip stool. Keep the floor dry. Clean up any water that spills on the floor as soon as it happens. Remove soap buildup in the tub or shower regularly. Attach bath mats securely with double-sided non-slip rug tape. Do not have throw rugs and other things on the floor that can make you trip. What can I do in the bedroom? Use night lights. Make sure that you have a light by your bed that is easy to reach. Do not use  any sheets or blankets that are too big for your bed. They should not hang down onto the floor. Have a firm chair that has side arms. You can use this for support while you get dressed. Do not have throw rugs and other things on the floor that can make you trip. What can I do in the kitchen? Clean up any spills right away. Avoid walking on wet floors. Keep items that you use a lot in easy-to-reach places. If you need to reach something above you, use a strong step stool that has a grab bar. Keep electrical cords out of the way. Do not use floor polish or wax that makes floors slippery. If you must use wax, use non-skid floor wax. Do not have throw rugs and other things on the floor that can make you trip. What can I do with my stairs? Do not leave any items on the stairs. Make sure that there are handrails on both sides of the stairs and use them. Fix handrails that are broken or loose. Make sure that handrails are as long as the stairways. Check any carpeting to make sure that it is firmly attached to the stairs. Fix any carpet that is loose or worn. Avoid having throw rugs at the top or bottom of the stairs. If you do have throw rugs, attach them to  the floor with carpet tape. Make sure that you have a light switch at the top of the stairs and the bottom of the stairs. If you do not have them, ask someone to add them for you. What else can I do to help prevent falls? Wear shoes that: Do not have high heels. Have rubber bottoms. Are comfortable and fit you well. Are closed at the toe. Do not wear sandals. If you use a stepladder: Make sure that it is fully opened. Do not climb a closed stepladder. Make sure that both sides of the stepladder are locked into place. Ask someone to hold it for you, if possible. Clearly mark and make sure that you can see: Any grab bars or handrails. First and last steps. Where the edge of each step is. Use tools that help you move around (mobility aids)  if they are needed. These include: Canes. Walkers. Scooters. Crutches. Turn on the lights when you go into a dark area. Replace any light bulbs as soon as they burn out. Set up your furniture so you have a clear path. Avoid moving your furniture around. If any of your floors are uneven, fix them. If there are any pets around you, be aware of where they are. Review your medicines with your doctor. Some medicines can make you feel dizzy. This can increase your chance of falling. Ask your doctor what other things that you can do to help prevent falls. This information is not intended to replace advice given to you by your health care provider. Make sure you discuss any questions you have with your health care provider. Document Released: 06/23/2009 Document Revised: 02/02/2016 Document Reviewed: 10/01/2014 Elsevier Interactive Patient Education  2017 [employer].

## 2022-02-15 ENCOUNTER — Telehealth: Payer: Self-pay | Admitting: Hematology

## 2022-02-15 NOTE — Telephone Encounter (Signed)
Scheduled follow-up appointment per 6/7 los. Patient is aware. 

## 2022-02-18 ENCOUNTER — Encounter: Payer: Self-pay | Admitting: Hematology

## 2022-02-21 ENCOUNTER — Telehealth: Payer: Self-pay | Admitting: Physical Medicine and Rehabilitation

## 2022-02-21 NOTE — Telephone Encounter (Signed)
Pt called to schedule an appt. Please call pt at [phone]

## 2022-02-23 ENCOUNTER — Emergency Department (HOSPITAL_COMMUNITY)
Admission: EM | Admit: 2022-02-23 | Discharge: 2022-02-23 | Disposition: A | Discharge status: Home / Self Care | Payer: Medicare Other | Attending: Emergency Medicine | Admitting: Emergency Medicine

## 2022-02-23 ENCOUNTER — Other Ambulatory Visit: Payer: Self-pay

## 2022-02-23 ENCOUNTER — Encounter (HOSPITAL_COMMUNITY): Payer: Self-pay | Admitting: Pharmacy Technician

## 2022-02-23 DIAGNOSIS — I2510 Atherosclerotic heart disease of native coronary artery without angina pectoris: Secondary | ICD-10-CM | POA: Diagnosis not present

## 2022-02-23 DIAGNOSIS — Z7901 Long term (current) use of anticoagulants: Secondary | ICD-10-CM | POA: Diagnosis not present

## 2022-02-23 DIAGNOSIS — I499 Cardiac arrhythmia, unspecified: Secondary | ICD-10-CM | POA: Diagnosis not present

## 2022-02-23 DIAGNOSIS — R197 Diarrhea, unspecified: Secondary | ICD-10-CM | POA: Insufficient documentation

## 2022-02-23 DIAGNOSIS — R112 Nausea with vomiting, unspecified: Secondary | ICD-10-CM | POA: Diagnosis not present

## 2022-02-23 DIAGNOSIS — Z79899 Other long term (current) drug therapy: Secondary | ICD-10-CM | POA: Insufficient documentation

## 2022-02-23 DIAGNOSIS — R55 Syncope and collapse: Secondary | ICD-10-CM | POA: Diagnosis not present

## 2022-02-23 DIAGNOSIS — Z743 Need for continuous supervision: Secondary | ICD-10-CM | POA: Diagnosis not present

## 2022-02-23 DIAGNOSIS — I10 Essential (primary) hypertension: Secondary | ICD-10-CM | POA: Diagnosis not present

## 2022-02-23 DIAGNOSIS — R404 Transient alteration of awareness: Secondary | ICD-10-CM | POA: Diagnosis not present

## 2022-02-23 DIAGNOSIS — R6889 Other general symptoms and signs: Secondary | ICD-10-CM | POA: Diagnosis not present

## 2022-02-23 DIAGNOSIS — E860 Dehydration: Secondary | ICD-10-CM | POA: Diagnosis not present

## 2022-02-23 LAB — CBC WITH DIFFERENTIAL/PLATELET
Abs Immature Granulocytes: 0.05 10*3/uL (ref 0.00–0.07)
Basophils Absolute: 0 10*3/uL (ref 0.0–0.1)
Basophils Relative: 0 %
Eosinophils Absolute: 0.3 10*3/uL (ref 0.0–0.5)
Eosinophils Relative: 3 %
HCT: 40.3 % (ref 36.0–46.0)
Hemoglobin: 12.9 g/dL (ref 12.0–15.0)
Immature Granulocytes: 1 %
Lymphocytes Relative: 7 %
Lymphs Abs: 0.6 10*3/uL — ABNORMAL LOW (ref 0.7–4.0)
MCH: 30.1 pg (ref 26.0–34.0)
MCHC: 32 g/dL (ref 30.0–36.0)
MCV: 94.2 fL (ref 80.0–100.0)
Monocytes Absolute: 1 10*3/uL (ref 0.1–1.0)
Monocytes Relative: 10 %
Neutro Abs: 7.5 10*3/uL (ref 1.7–7.7)
Neutrophils Relative %: 79 %
Platelets: 284 10*3/uL (ref 150–400)
RBC: 4.28 MIL/uL (ref 3.87–5.11)
RDW: 21.2 % — ABNORMAL HIGH (ref 11.5–15.5)
WBC: 9.4 10*3/uL (ref 4.0–10.5)
nRBC: 0 % (ref 0.0–0.2)

## 2022-02-23 LAB — COMPREHENSIVE METABOLIC PANEL
ALT: 10 U/L (ref 0–44)
AST: 23 U/L (ref 15–41)
Albumin: 3.4 g/dL — ABNORMAL LOW (ref 3.5–5.0)
Alkaline Phosphatase: 66 U/L (ref 38–126)
Anion gap: 10 (ref 5–15)
BUN: 22 mg/dL (ref 8–23)
CO2: 18 mmol/L — ABNORMAL LOW (ref 22–32)
Calcium: 8.9 mg/dL (ref 8.9–10.3)
Chloride: 108 mmol/L (ref 98–111)
Creatinine, Ser: 1.28 mg/dL — ABNORMAL HIGH (ref 0.44–1.00)
GFR, Estimated: 41 mL/min — ABNORMAL LOW (ref >60)
Glucose, Bld: 108 mg/dL — ABNORMAL HIGH (ref 70–99)
Potassium: 4.9 mmol/L (ref 3.5–5.1)
Sodium: 136 mmol/L (ref 135–145)
Total Bilirubin: 0.6 mg/dL (ref 0.3–1.2)
Total Protein: 6.5 g/dL (ref 6.5–8.1)

## 2022-02-23 LAB — LIPASE, BLOOD: Lipase: 42 U/L (ref 11–51)

## 2022-02-23 MED ORDER — SODIUM CHLORIDE 0.9 % IV SOLN
1000.0000 mL | INTRAVENOUS | Status: DC
  Administered 2022-02-23: 1000 mL via INTRAVENOUS

## 2022-02-23 MED ORDER — ONDANSETRON 8 MG PO TBDP: 8.0000 mg | ORAL_TABLET | Freq: Three times a day (TID) | ORAL | 0 refills | Status: DC | PRN

## 2022-02-23 MED ORDER — SODIUM CHLORIDE 0.9 % IV BOLUS (SEPSIS)
1000.0000 mL | Freq: Once | INTRAVENOUS | Status: AC
  Administered 2022-02-23: 1000 mL via INTRAVENOUS

## 2022-02-23 NOTE — ED Provider Notes (Signed)
[facility] EMERGENCY DEPARTMENT Provider Note   CSN: [id number] Arrival date & time: 02/23/22  1819     History  Chief Complaint  Patient presents with   Emesis   Near Syncope    Hailey [name] is a 87 y.o. female.   Emesis Near Syncope   Patient has a history of hypertension, A-fib, syncope, diverticulitis, coronary artery disease, bradycardia, right bundle branch block Patient states she had a tooth pulled on Monday.  She started taking amoxicillin following the tooth extraction.  Patient states today she started having having episodes of nausea and vomiting.  She then began having episodes of loose stools.  Patient denies any blood in her emesis or stool.  She is not having any abdominal pain but she started to feel dizzy and lightheaded.  She ended up slumping to the ground when she tried to stand up.  She did not injure herself.  She did not hit her head.  Patient does take Eliquis for A-fib.  EMS was called and she was noted to have a blood pressure of 65/36.  She was given 800 cc of normal saline.  Her blood pressure is improved  Home Medications Prior to Admission medications   Medication Sig Start Date End Date Taking? Authorizing Provider  ondansetron (ZOFRAN-ODT) 8 MG disintegrating tablet Take 1 tablet (8 mg total) by mouth every 8 (eight) hours as needed for nausea or vomiting. 02/23/22  Yes Dorie Rank, MD  acetaminophen (TYLENOL) 500 MG tablet Take 1,000 mg by mouth every 6 (six) hours as needed (body aches / sore hip). Maximum of 6 tablets daily ('3000mg'$ )    [provider]  atorvastatin (LIPITOR) 40 MG tablet Take 1 tablet (40 mg total) by mouth every Monday, Wednesday, and Friday. 12/04/21   Swinyer, [name], NP  BIOTIN PO Take 5,000 mcg by mouth in the morning.    [provider]  Cholecalciferol (VITAMIN D3) 50 MCG (2000 UT) capsule Take 1 capsule (2,000 Units total) by mouth daily. 11/22/21   [facility], Evie Lacks, MD   clobetasol ointment (TEMOVATE) 3.54 % Apply 1 application topically to affected area twice daily for 2 weeks and then reduce to twice a week at night. 11/21/21   Nunzio Cobbs, MD  Cyanocobalamin (VITAMIN B-12) 1000 MCG SUBL Place 1 tablet (1,000 mcg total) under the tongue daily. 11/22/21   [facility], Evie Lacks, MD  diclofenac Sodium (VOLTAREN) 1 % GEL Apply 2 g topically 4 (four) times daily as needed (back pain.).    [provider]  dicyclomine (BENTYL) 20 MG tablet Take 1 by mouth every 4-6 hours as needed for cramping 08/16/21   Irene Shipper, MD  DULoxetine (CYMBALTA) 60 MG capsule Take 1 capsule (60 mg total) by mouth daily. Overdue for annual physical with labs 11/03/21   [facility], Evie Lacks, MD  ELIQUIS 2.5 MG TABS tablet TAKE 1 TABLET BY MOUTH TWICE DAILY. 12/18/21   Belva Crome, MD  empagliflozin (JARDIANCE) 10 MG TABS tablet Take 1 tablet (10 mg total) by mouth daily before breakfast. 07/04/21   Belva Crome, MD  esomeprazole (NEXIUM) 40 MG capsule TAKE ONE CAPSULE ONCE DAILY. 01/30/22   [facility], Evie Lacks, MD  Homeopathic Products (ARNICARE) GEL Apply 1 application topically daily as needed (skin bruising).    [provider]  hyoscyamine (LEVSIN) 0.125 MG tablet TAKE 1-2 TABLETS (0.125-0.25 MG) BY MOUTH EVERY 4 HOURS AS NEEDED FOR UP TO 10 DAYS FOR CRAMPING.  Patient taking differently: Take 0.125-0.25 mg by mouth every 4 (four) hours as needed for cramping. 03/27/20   [facility], Evie Lacks, MD  LORazepam (ATIVAN) 1 MG tablet TAKE ONE TABLET BY MOUTH TWICE DAILY AS NEEDED FOR ANXIETY / SLEEP. Patient taking differently: Take 1-1.5 mg by mouth at bedtime. 07/28/21   [facility], Evie Lacks, MD  losartan (COZAAR) 25 MG tablet TAKE 1 TABLET ONCE DAILY. Patient taking differently: Take 25 mg by mouth daily. 07/25/21   Belva Crome, MD  metoprolol succinate (TOPROL-XL) 25 MG 24 hr tablet Take 1 tablet (25 mg total) by mouth daily. 07/25/21   Belva Crome, MD   Multiple Vitamins-Minerals (PRESERVISION AREDS 2+MULTI VIT) CAPS Take 1 capsule by mouth 2 (two) times daily.    [provider]  nitroGLYCERIN (NITROSTAT) 0.4 MG SL tablet Place 0.4 mg under the tongue every 5 (five) minutes as needed for chest pain (Call 911 at 3rd dose within 15 minutes.).    [provider]  Peppermint Oil (IBGARD) 90 MG CPCR Take 1 capsule before meals Patient taking differently: Take 1 capsule by mouth 2 (two) times daily before a meal. 06/28/21   Irene Shipper, MD  Polyethyl Glycol-Propyl Glycol (LUBRICANT EYE DROPS) 0.4-0.3 % SOLN Place 1-2 drops into both eyes 3 (three) times daily as needed (dry/irritated eyes.).    [provider]  polyethylene glycol (MIRALAX / GLYCOLAX) 17 g packet Take 17 g by mouth daily.    [provider]  saccharomyces boulardii (FLORASTOR) 250 MG capsule Take 1 capsule (250 mg total) by mouth 2 (two) times daily. Patient taking differently: Take 250 mg by mouth daily. 08/30/20   Oswald Hillock, MD  sodium chloride (OCEAN) 0.65 % SOLN nasal spray Place 1 spray into both nostrils at bedtime as needed for congestion.    [provider]  spironolactone (ALDACTONE) 25 MG tablet Take 0.5 tablets (12.5 mg total) by mouth daily. Patient taking differently: Take 12.5 mg by mouth every Monday, Wednesday, and Friday. 07/04/21   Belva Crome, MD  terconazole (TERAZOL 7) 0.4 % vaginal cream Place 1 applicator vaginally at bedtime. One applicator full QHS for seven days of therapy 12/06/21   Nunzio Cobbs, MD      Allergies    Macrobid [nitrofurantoin monohyd macro], Meloxicam, Digoxin and related, Ferrous sulfate, Keflex [cephalexin], Mobic [meloxicam], and Sulfamethoxazole-trimethoprim    Review of Systems   Review of Systems  Cardiovascular:  Positive for near-syncope.  Gastrointestinal:  Positive for vomiting.    Physical Exam Updated Vital Signs BP 102/85   Pulse 80   Temp 97.9 F (36.6  C) (Oral)   Resp 19   SpO2 90%  Physical Exam Vitals and nursing note reviewed.  Constitutional:      General: She is not in acute distress.    Appearance: She is well-developed. She is not diaphoretic.  HENT:     Head: Normocephalic and atraumatic.     Comments: Slight bruising noted below her left anterior aspect of the chin, patient states that occurred as a result of her dental extraction    Right Ear: External ear normal.     Left Ear: External ear normal.  Eyes:     General: No scleral icterus.       Right eye: No discharge.        Left eye: No discharge.     Conjunctiva/sclera: Conjunctivae normal.  Neck:     Trachea: No tracheal deviation.  Cardiovascular:     Rate and Rhythm: Normal rate and regular rhythm.  Pulmonary:     Effort: Pulmonary effort is normal. No respiratory distress.     Breath sounds: Normal breath sounds. No stridor. No wheezing or rales.  Abdominal:     General: Bowel sounds are normal. There is no distension.     Palpations: Abdomen is soft.     Tenderness: There is no abdominal tenderness. There is no guarding or rebound.  Musculoskeletal:        General: No tenderness or deformity.     Cervical back: Neck supple.  Skin:    General: Skin is warm and dry.     Findings: No rash.     Comments: Small abrasion noted on the shin  Neurological:     General: No focal deficit present.     Mental Status: She is alert.     Cranial Nerves: No cranial nerve deficit (no facial droop, extraocular movements intact, no slurred speech).     Sensory: No sensory deficit.     Motor: No abnormal muscle tone or seizure activity.     Coordination: Coordination normal.  Psychiatric:        Mood and Affect: Mood normal.     ED Results / Procedures / Treatments   Labs (all labs ordered are listed, but only abnormal results are displayed) Labs Reviewed  COMPREHENSIVE METABOLIC PANEL - Abnormal; Notable for the following components:      Result Value   CO2 18  (*)    Glucose, Bld 108 (*)    Creatinine, Ser 1.28 (*)    Albumin 3.4 (*)    GFR, Estimated 41 (*)    All other components within normal limits  CBC WITH DIFFERENTIAL/PLATELET - Abnormal; Notable for the following components:   RDW 21.2 (*)    Lymphs Abs 0.6 (*)    All other components within normal limits  C DIFFICILE QUICK SCREEN W PCR REFLEX    LIPASE, BLOOD  URINALYSIS, ROUTINE W REFLEX MICROSCOPIC    EKG EKG Interpretation  Date/Time:  Friday February 23 2022 18:35:13 EDT Ventricular Rate:  80 PR Interval:    QRS Duration: 137 QT Interval:  456 QTC Calculation: 527 R Axis:   -22 Text Interpretation: Atrial flutter with predominant 4:1 AV block Right bundle branch block No significant change since last tracing Confirmed by Dorie Rank [id number]) on 02/23/2022 8:14:08 PM  Radiology No results found.  Procedures Procedures    Medications Ordered in ED Medications  sodium chloride 0.9 % bolus 1,000 mL (0 mLs Intravenous Stopped 02/23/22 1956)    Followed by  0.9 %  sodium chloride infusion (1,000 mLs Intravenous New Bag/Given 02/23/22 1957)    ED Course/ Medical Decision Making/ A&P Clinical Course as of 02/23/22 2133  Fri Feb 23, 2022  1919 Comprehensive metabolic panel(!) Creatinine elevated compared to previous [JK]  1919 CBC with Diff(!) Normal [JK]  1919 Lipase, blood Normal [JK]    Clinical Course User Index [JK] Dorie Rank, MD                           Medical Decision Making Problems Addressed: Dehydration: acute illness or injury that poses a threat to life or bodily functions Near syncope: acute illness or injury  Amount and/or Complexity of Data Reviewed Labs: ordered. Decision-making details documented in ED Course.  Risk Prescription drug management. Decision regarding hospitalization.   Patient presented to the  ED for evaluation of a near syncopal episode.  This was in the setting of vomiting.  Patient recently saw a dentist and had a dental  procedure.  She started on antibiotics.  She is not sure if that is contributed to her nausea vomiting and symptoms.  ED work-up did show mild dehydration.  Otherwise no signs of acute infection.  No signs of acute cardiac dysrhythmia.  A-fib a flutter is chronic.  Patient was treated with IV fluids.  She has not had any further episodes of vomiting or diarrhea.  Patient is feeling much better.  She is not having any abdominal pain.  Discussed option of being admitted to the hospital for overnight observation versus discharge and close follow-up.  Patient states she would prefer to go home at this time.  Evaluation and diagnostic testing in the emergency department does not suggest an emergent condition requiring admission or immediate intervention beyond what has been performed at this time.  The patient is safe for discharge and has been instructed to return immediately for worsening symptoms, change in symptoms or any other concerns.         Final Clinical Impression(s) / ED Diagnoses Final diagnoses:  Near syncope  Dehydration    Rx / DC Orders ED Discharge Orders          Ordered    ondansetron (ZOFRAN-ODT) 8 MG disintegrating tablet  Every 8 hours PRN        02/23/22 2132              Dorie Rank, MD 02/23/22 2133

## 2022-02-23 NOTE — Discharge Instructions (Addendum)
Drink plenty fluids.  Return to the ER for any recurrent symptoms, abdominal pain or fever.  Follow-up with your doctor next week to be rechecked

## 2022-02-23 NOTE — ED Triage Notes (Signed)
Pt bib ems from home with reports of nausea vomiting and diarrhea for the last few days. Pt had a tooth pulled Monday and started on amoxicillin Tuesday. Pt with falls due to dizziness when standing. Pt does take eliquis. No head injury noted. Pt with HR 40-60 afib BP 65/36, given 800cc NS with improvement to 90/60.

## 2022-03-06 ENCOUNTER — Other Ambulatory Visit: Payer: Self-pay

## 2022-03-06 DIAGNOSIS — I4821 Permanent atrial fibrillation: Secondary | ICD-10-CM

## 2022-03-06 MED ORDER — EMPAGLIFLOZIN 10 MG PO TABS: 10.0000 mg | ORAL_TABLET | Freq: Every day | ORAL | 2 refills | Status: DC

## 2022-03-06 MED ORDER — APIXABAN 2.5 MG PO TABS: 2.5000 mg | ORAL_TABLET | Freq: Two times a day (BID) | ORAL | 1 refills | Status: DC

## 2022-03-06 NOTE — Telephone Encounter (Signed)
Eliquis 2.'5mg'$  refill request received. Patient is 87 years old, weight-62.6kg, Crea-1.28 on 02/23/2022, Diagnosis-Afib, and last seen by Christen Bame on 11/23/2021. Dose is appropriate based on OV note recommendations. Will send in refill to requested pharmacy.    Per last OV note by Dr. Quentin Ore: Permanent atrial fibrillation Rate controlled.  On Eliquis 2.5 mg by mouth twice daily for stroke prophylaxis.  We discussed stroke pathophysiology during today's clinic visit.  We discussed how left atrial appendage occlusion can be used to help mitigate the stroke risk.  She is very interested in avoiding long-term use of anticoagulation because of her history of recurrent falls.  She would like to proceed with watchman scheduling.

## 2022-03-08 ENCOUNTER — Ambulatory Visit: Payer: Medicare Other | Admitting: Sports Medicine

## 2022-03-08 DIAGNOSIS — M7061 Trochanteric bursitis, right hip: Secondary | ICD-10-CM

## 2022-03-08 DIAGNOSIS — M7062 Trochanteric bursitis, left hip: Secondary | ICD-10-CM | POA: Diagnosis not present

## 2022-03-08 MED ORDER — METHYLPREDNISOLONE ACETATE 40 MG/ML IJ SUSP
40.0000 mg | Freq: Once | INTRAMUSCULAR | Status: AC
  Administered 2022-03-08: 40 mg via INTRA_ARTICULAR

## 2022-03-08 NOTE — Assessment & Plan Note (Signed)
CSI today  Easy standing hip abdcution  Easy standing hip rotation  Walk to tolerance  Ice for next 48 hours  15 mins

## 2022-03-08 NOTE — Progress Notes (Signed)
Established Patient Office Visit  Subjective   Patient ID: Hailey [name], female    DOB: 1934-12-10  Age: 87 y.o. MRN: [id number]  HPI Hailey [name] is a 87 year old female presenting for recurrence of greater trochanteric bursitis. She has had this in the past and responded well to hip injections. Last hip injection was about 1 year ago and worked well for her. Denies any trauma, locking, or weakness. No falls. Gait is normal. Reports pain is more of an annoyance and will be going to the beach in the coming week and looking for relief with a repeat injection today.  Past Medical History:  Diagnosis Date   Acute blood loss anemia 12/25/2017   Allergic rhinitis    Anxiety    Arthritis    "my whole spine" (07/01/2017)   Atrial fibrillation (HCC)    Bowel obstruction (HCC)    in [place]   Bradycardia with 41-50 beats per minute 12/27/2017   Cancer ([place])    Cellulitis of leg, right with large prepatella hematoma and open wounds 12/26/2017   CKD (chronic kidney disease) stage 2, GFR 60-89 ml/min    Colon polyps    Coronary artery disease    10/18 PCI/DES to p/m LCx with cutting balloon to mLcx   Diverticulosis of colon    GERD (gastroesophageal reflux disease)    Hip bursitis 2010   Dr Para March, Post op seroma   History of colon polyps    HSV (herpes simplex virus) anogenital infection 07/2019   HTN (hypertension)    IBS (irritable bowel syndrome)    constipation predominant - Dr Earlean Shawl   Lichen sclerosus    Osteopenia 11/2016   T score -2.0 FRAX 15%/4.3%   PAC (premature atrial contraction)    Symptomatiic   Renal insufficiency    Right bundle branch block (RBBB) on electrocardiogram (ECG) 12/27/2017   Scoliosis    SVT (supraventricular tachycardia) ([place])    brief history   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   biopsy showing vulvar atypia, possible VIN I   Past Surgical History:  Procedure Laterality Date   ANTERIOR AND POSTERIOR VAGINAL REPAIR  01/2002    Archie Endo 01/23/2011   APPENDECTOMY  1948   CARDIAC CATHETERIZATION  06/26/2017   CORONARY ANGIOPLASTY WITH STENT PLACEMENT  07/01/2017   CORONARY ATHERECTOMY N/A 07/01/2017   Procedure: CORONARY ATHERECTOMY;  Surgeon: Belva Crome, MD;  Location: [facility];  Service: Cardiovascular;  Laterality: N/A;   CORONARY STENT INTERVENTION N/A 07/01/2017   Procedure: CORONARY STENT INTERVENTION;  Surgeon: Belva Crome, MD;  Location: [facility];  Service: Cardiovascular;  Laterality: N/A;   HAMMER TOE SURGERY     HEMORRHOID BANDING     HIP SURGERY Left 04/2009   hip examination under anesthesia followed by greater trochanteric bursectomy; iliotibial band tenotomy/notes 01/20/2011   I & D EXTREMITY Right 01/10/2018   Procedure: IRRIGATION AND DEBRIDEMENT RIGHT KNEE, APPLY WOUND VAC;  Surgeon: Newt Minion, MD;  Location: [facility];  Service: Orthopedics;  Laterality: Right;   KNEE BURSECTOMY Right 04/2009   Archie Endo 01/09/2011   LEFT ATRIAL APPENDAGE OCCLUSION N/A 08/24/2021   Procedure: LEFT ATRIAL APPENDAGE OCCLUSION;  Surgeon: Sherren Mocha, MD;  Location: [facility];  Service: Cardiovascular;  Laterality: N/A;   LEFT HEART CATH AND CORONARY ANGIOGRAPHY N/A 06/26/2017   Procedure: LEFT HEART CATH AND CORONARY ANGIOGRAPHY;  Surgeon: Belva Crome, MD;  Location: [facility];  Service:  Cardiovascular;  Laterality: N/A;   PUBOVAGINAL SLING  01/2002   Archie Endo 01/23/2011   REDUCTION MAMMAPLASTY     TEE WITHOUT CARDIOVERSION N/A 08/24/2021   Procedure: TRANSESOPHAGEAL ECHOCARDIOGRAM (TEE);  Surgeon: Sherren Mocha, MD;  Location: [facility];  Service: Cardiovascular;  Laterality: N/A;   TEMPORARY PACEMAKER N/A 07/01/2017   Procedure: TEMPORARY PACEMAKER;  Surgeon: Belva Crome, MD;  Location: [facility];  Service: Cardiovascular;  Laterality: N/A;   VAGINAL HYSTERECTOMY  01/2002   Vaginal hysterectomy, bilateral salpingo-oophorectomy/notes 01/23/2011    Social History   Tobacco Use   Smoking status: Former    Packs/day: 0.25    Years: 28.00    Total pack years: 7.00    Types: Cigarettes    Quit date: 1981    Years since quitting: 42.5   Smokeless tobacco: Never  Vaping Use   Vaping Use: Never used  Substance Use Topics   Alcohol use: Yes    Comment: 7 vodka drinks a week   Drug use: Never   Family History  Problem Relation Age of Onset   Colon cancer Mother        Dx age 91, died at age 20   Diabetes Father    Prostate cancer Father    Prostate cancer Brother    Pancreatic cancer Brother    Stomach cancer Son    Heart attack Neg Hx    Stroke Neg Hx    Esophageal cancer Neg Hx    Allergies  Allergen Reactions   Macrobid [Nitrofurantoin Monohyd Macro] Other (See Comments)    Nausea, stomach cramps, fatigue , headache.   Meloxicam Other (See Comments)    Jittery and headache   Digoxin And Related     headaches   Ferrous Sulfate     Bad constipation    Keflex [Cephalexin] Nausea And Vomiting    Nausea, fatigue, and headache.   Mobic [Meloxicam] Other (See Comments)    Unsure of reaction type   Sulfamethoxazole-Trimethoprim Nausea Only    ROS As noted above   Objective:     BP 110/78   Ht '5\' 3"'$  (1.6 m)   Wt 136 lb (61.7 kg)   BMI 24.09 kg/m     Physical Exam  No acute distress, well-developed, well-appearing, nontoxic  Inspection: No gross abnormalities or asymmetry.  No ecchymosis, erythema, soft tissue swelling, or lesions appreciated. Palpation: TTP over the right greater trochanter.  No vertebral or paraspinal TTP.   ROM: Good internal and external rotation of the hip without pain.  Normal flexion and extension of the hip with normal strength 5/5.  Decreased abductor strength on the right hip 4/5. Special tests: Negative straight leg raise. Negative FADER. Negative FADIR. Negative log roll. Neurovascular: Neurovascularly intact distally  Aspiration/Injection Procedure Note Hailey [name] 1935-06-08  Procedure: Injection of Hip, Trochanteric Bursa Indications: Pain  Procedure Details Verbal consent obtained. Risks, benefits, and alternatives reviewed. Greater trochanter sterilely prepped with Chloraprep. Ethyl Chloride used for anesthesia. 4 cc of Lidocaine 1% injected with 1 mL of Kenalog 40 mg into trochanteric bursa at area of maximal tenderness at greater trochanter. Needle taken to troch bursa, flows easily. Bursa massaged. Bandage applied. No bleeding and no complications. Decreased pain after injection. Needle: 25 gauge 1.5 needle    Assessment & Plan:   Problem List Items Addressed This Visit   None   1.  Greater trochanteric pain syndrome, right hip - greater trochanter steroid injection today without complication -  adductor hip strengthening exercises - continue physical activity as tolerates - follow up as needed   Clyde Lundborg, MD FM PGY-2  I observed and examined the patient with the resident and agree with assessment and plan.  Note reviewed and modified by me. Ila Mcgill, MD

## 2022-03-15 ENCOUNTER — Telehealth: Payer: Self-pay | Admitting: Interventional Cardiology

## 2022-03-15 NOTE — Telephone Encounter (Signed)
Pt states that she had a tooth extraction recently. She states she had some bleeding around the area and was told to stop taking eliquis and the bleeding stopped. Now she is back on eliquis and she is still bleeding periodically and wants to know what to do. Please advise.

## 2022-03-15 NOTE — Telephone Encounter (Signed)
Returned call to patient.  Patient states she had a tooth extraction for an implant about a month ago. She reports she had bleeding after this and the oral surgeon recommended holding Eliquis for 2 days. Patient states she did this and the bleeding stopped.   She has since restarted Eliquis and states she is still having bleeding occasionally around tooth extraction site. She states she tries to chew on other side of her mouth, tries to eat soft foods and still has periodic bleeding.  Patient reports her oral surgeon states the site is healing well.  Patient states she did not take her evening dose of Eliquis last night (03/14/22) or this morning's dose due to bleeding last night.  Patient states she is able to get the bleeding to stop after about 30 minutes with a compress. She is not sure what else she can do.  Will forward to Dr. Tamala Julian to review and advise.

## 2022-03-16 NOTE — Telephone Encounter (Signed)
Notified patient of Dr. Thompson Caul recommendation: Make sure the surgeon knows she is still having bleeding. Hold Eliquis for a total of 48 hours including what she has already started today then resume at standard dose.   Patient verbalized understanding.

## 2022-03-19 ENCOUNTER — Telehealth: Payer: Self-pay | Admitting: *Deleted

## 2022-03-19 NOTE — Telephone Encounter (Signed)
Patient called regarding ongoing vulvar discomfort, patient did see Dr. Earnie Larsson in May, she prescribed a ointment Flucoinide ointment to apply bid patient said it made the area worse. Patient said she stopped the medication reports the area is swollen with discomfort. I asked if patient spoke with Dr. Earnie Larsson office and patient did not call her office to update her the ointment is not working. She is currently in [place], [place] for the next six weeks. Please advise

## 2022-03-19 NOTE — Telephone Encounter (Signed)
I [place] she [place] for [place].   She may also want to call Dr. Doristine Section office to receive further recommendations.

## 2022-03-20 NOTE — Telephone Encounter (Signed)
Patient informed number given to [place] office to call.

## 2022-04-04 ENCOUNTER — Telehealth: Payer: Medicare Other

## 2022-04-09 ENCOUNTER — Other Ambulatory Visit: Payer: Self-pay | Admitting: Internal Medicine

## 2022-04-10 DIAGNOSIS — S80212A Abrasion, left knee, initial encounter: Secondary | ICD-10-CM | POA: Diagnosis not present

## 2022-04-10 DIAGNOSIS — M25562 Pain in left knee: Secondary | ICD-10-CM | POA: Diagnosis not present

## 2022-04-10 DIAGNOSIS — I4891 Unspecified atrial fibrillation: Secondary | ICD-10-CM | POA: Diagnosis not present

## 2022-04-10 DIAGNOSIS — W1830XA Fall on same level, unspecified, initial encounter: Secondary | ICD-10-CM | POA: Diagnosis not present

## 2022-04-10 DIAGNOSIS — I10 Essential (primary) hypertension: Secondary | ICD-10-CM | POA: Diagnosis not present

## 2022-04-10 DIAGNOSIS — Z7901 Long term (current) use of anticoagulants: Secondary | ICD-10-CM | POA: Diagnosis not present

## 2022-04-19 ENCOUNTER — Ambulatory Visit: Payer: Medicare Other | Admitting: Sports Medicine

## 2022-04-19 VITALS — BP 108/64 | Ht 63.0 in | Wt 135.0 lb

## 2022-04-19 DIAGNOSIS — R296 Repeated falls: Secondary | ICD-10-CM | POA: Diagnosis not present

## 2022-04-19 DIAGNOSIS — W010XXA Fall on same level from slipping, tripping and stumbling without subsequent striking against object, initial encounter: Secondary | ICD-10-CM | POA: Diagnosis not present

## 2022-04-19 DIAGNOSIS — R2689 Other abnormalities of gait and mobility: Secondary | ICD-10-CM

## 2022-04-19 NOTE — Progress Notes (Signed)
PCP: Plotnikov, Evie Lacks, MD  Subjective:   HPI: Patient is a 87 y.o. female here for multiple injuries from a fall.  The patient fell about 1 week ago on the sidewalk of a hospital in [facility] where her husband was hospitalized for an infection. She went down hard on her left knee, and both hands. She was taken to the ED and had an x-ray of her knee. She had significant bleeding as she is on a blood thinner. Then she had a second fall in her home 2 days ago where she fell onto her right hip and right elbow. She has significant pain over her right elbow, both palms, right middle finger, right hip, and left knee. She did hit her head in the second fall but did not lose consciousness. She has not had a headache, blurry vision, dizziness, nausea or vomiting. History of spinal stenosis and several falls.   Past Medical History:  Diagnosis Date   Acute blood loss anemia 12/25/2017   Allergic rhinitis    Anxiety    Arthritis    "my whole spine" (07/01/2017)   Atrial fibrillation (HCC)    Bowel obstruction (HCC)    in [place]   Bradycardia with 41-50 beats per minute 12/27/2017   Cancer ([place])    Cellulitis of leg, right with large prepatella hematoma and open wounds 12/26/2017   CKD (chronic kidney disease) stage 2, GFR 60-89 ml/min    Colon polyps    Coronary artery disease    10/18 PCI/DES to p/m LCx with cutting balloon to mLcx   Diverticulosis of colon    GERD (gastroesophageal reflux disease)    Hip bursitis 2010   Dr Para March, Post op seroma   History of colon polyps    HSV (herpes simplex virus) anogenital infection 07/2019   HTN (hypertension)    IBS (irritable bowel syndrome)    constipation predominant - Dr Earlean Shawl   Lichen sclerosus    Osteopenia 11/2016   T score -2.0 FRAX 15%/4.3%   PAC (premature atrial contraction)    Symptomatiic   Renal insufficiency    Right bundle branch block (RBBB) on electrocardiogram (ECG) 12/27/2017   Scoliosis    SVT (supraventricular  tachycardia) ([place])    brief history   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 Maximum of 6 tablets daily ('3000mg'$ )     apixaban (ELIQUIS) 2.5 MG TABS tablet Take 1 tablet (2.5 mg total) by mouth 2 (two) times daily. 180 tablet 1   atorvastatin (LIPITOR) 40 MG tablet Take 1 tablet (40 mg total) by mouth every Monday, Wednesday, and Friday. 45 tablet 3   BIOTIN PO Take 5,000 mcg by mouth in the morning.     Cholecalciferol (VITAMIN D3) 50 MCG (2000 UT) capsule Take 1 capsule (2,000 Units total) by mouth daily. 100 capsule 3   clobetasol ointment (TEMOVATE) 1.91 % Apply 1 application topically to affected area twice daily for 2 weeks and then reduce to twice a week at night. 30 g 0   Cyanocobalamin (VITAMIN B-12) 1000 MCG SUBL Place 1 tablet (1,000 mcg total) under the tongue daily. 100 tablet 3   diclofenac Sodium (VOLTAREN) 1 % GEL Apply 2 g topically 4 (four)  times daily as needed (back pain.).     dicyclomine (BENTYL) 20 MG tablet Take 1 by mouth every 4-6 hours as needed for cramping 30 tablet 3   DULoxetine (CYMBALTA) 60 MG capsule Take 1 capsule (60 mg total) by mouth daily. Overdue for annual physical with labs 30 capsule 2   empagliflozin (JARDIANCE) 10 MG TABS tablet Take 1 tablet (10 mg total) by mouth daily before breakfast. 90 tablet 2   esomeprazole (NEXIUM) 40 MG capsule TAKE ONE CAPSULE ONCE DAILY. 90 capsule 1   Homeopathic Products (ARNICARE) GEL Apply 1 application topically daily as needed (skin bruising).     hyoscyamine (LEVSIN) 0.125 MG tablet TAKE 1-2 TABLETS (0.125-0.25 MG) BY MOUTH EVERY 4 HOURS AS NEEDED FOR UP TO 10 DAYS FOR CRAMPING. (Patient taking differently: Take 0.125-0.25 mg by mouth every 4 (four) hours  as needed for cramping.) 100 tablet 1   LORazepam (ATIVAN) 1 MG tablet TAKE ONE TABLET BY MOUTH TWICE DAILY AS NEEDED FOR ANXIETY / SLEEP. (Patient taking differently: Take 1-1.5 mg by mouth at bedtime.) 180 tablet 1   losartan (COZAAR) 25 MG tablet TAKE 1 TABLET ONCE DAILY. (Patient taking differently: Take 25 mg by mouth daily.) 90 tablet 3   metoprolol succinate (TOPROL-XL) 25 MG 24 hr tablet Take 1 tablet (25 mg total) by mouth daily. 90 tablet 3   Multiple Vitamins-Minerals (PRESERVISION AREDS 2+MULTI VIT) CAPS Take 1 capsule by mouth 2 (two) times daily.     nitroGLYCERIN (NITROSTAT) 0.4 MG SL tablet Place 0.4 mg under the tongue every 5 (five) minutes as needed for chest pain (Call 911 at 3rd dose within 15 minutes.).     ondansetron (ZOFRAN-ODT) 8 MG disintegrating tablet Take 1 tablet (8 mg total) by mouth every 8 (eight) hours as needed for nausea or vomiting. 12 tablet 0   Peppermint Oil (IBGARD) 90 MG CPCR Take 1 capsule before meals (Patient taking differently: Take 1 capsule by mouth 2 (two) times daily before a meal.)     Polyethyl Glycol-Propyl Glycol (LUBRICANT EYE DROPS) 0.4-0.3 % SOLN Place 1-2 drops into both eyes 3 (three) times daily as needed (dry/irritated eyes.).     polyethylene glycol (MIRALAX / GLYCOLAX) 17 g packet Take 17 g by mouth daily.     saccharomyces boulardii (FLORASTOR) 250 MG capsule Take 1 capsule (250 mg total) by mouth 2 (two) times daily. (Patient taking differently: Take 250 mg by mouth daily.) 60 capsule 2   sodium chloride (OCEAN) 0.65 % SOLN nasal spray Place 1 spray into both nostrils at bedtime as needed for congestion.     spironolactone (ALDACTONE) 25 MG tablet Take 0.5 tablets (12.5 mg total) by mouth daily. (Patient taking differently: Take 12.5 mg by mouth every Monday, Wednesday, and Friday.) 45 tablet 3   terconazole (TERAZOL 7) 0.4 % vaginal cream Place 1 applicator vaginally at bedtime. One applicator full QHS for seven days of therapy 45 g 0    No current facility-administered medications on file prior to visit.    Past Surgical History:  Procedure Laterality Date   ANTERIOR AND POSTERIOR VAGINAL REPAIR  01/2002   Archie Endo 01/23/2011   APPENDECTOMY  1948   CARDIAC CATHETERIZATION  06/26/2017   CORONARY ANGIOPLASTY WITH STENT PLACEMENT  07/01/2017   CORONARY ATHERECTOMY N/A 07/01/2017   Procedure: CORONARY ATHERECTOMY;  Surgeon: Belva Crome, MD;  Location: [facility];  Service: Cardiovascular;  Laterality: N/A;   CORONARY STENT INTERVENTION N/A 07/01/2017   Procedure: CORONARY  STENT INTERVENTION;  Surgeon: Belva Crome, MD;  Location: [facility];  Service: Cardiovascular;  Laterality: N/A;   HAMMER TOE SURGERY     HEMORRHOID BANDING     HIP SURGERY Left 04/2009   hip examination under anesthesia followed by greater trochanteric bursectomy; iliotibial band tenotomy/notes 01/20/2011   I & D EXTREMITY Right 01/10/2018   Procedure: IRRIGATION AND DEBRIDEMENT RIGHT KNEE, APPLY WOUND VAC;  Surgeon: Newt Minion, MD;  Location: [facility];  Service: Orthopedics;  Laterality: Right;   KNEE BURSECTOMY Right 04/2009   Archie Endo 01/09/2011   LEFT ATRIAL APPENDAGE OCCLUSION N/A 08/24/2021   Procedure: LEFT ATRIAL APPENDAGE OCCLUSION;  Surgeon: Sherren Mocha, MD;  Location: [facility];  Service: Cardiovascular;  Laterality: N/A;   LEFT HEART CATH AND CORONARY ANGIOGRAPHY N/A 06/26/2017   Procedure: LEFT HEART CATH AND CORONARY ANGIOGRAPHY;  Surgeon: Belva Crome, MD;  Location: [facility];  Service: Cardiovascular;  Laterality: N/A;   PUBOVAGINAL SLING  01/2002   Archie Endo 01/23/2011   REDUCTION MAMMAPLASTY     TEE WITHOUT CARDIOVERSION N/A 08/24/2021   Procedure: TRANSESOPHAGEAL ECHOCARDIOGRAM (TEE);  Surgeon: Sherren Mocha, MD;  Location: [facility];  Service: Cardiovascular;  Laterality: N/A;   TEMPORARY PACEMAKER N/A 07/01/2017   Procedure: TEMPORARY PACEMAKER;  Surgeon: Belva Crome, MD;   Location: [facility];  Service: Cardiovascular;  Laterality: N/A;   VAGINAL HYSTERECTOMY  01/2002   Vaginal hysterectomy, bilateral salpingo-oophorectomy/notes 01/23/2011    Allergies  Allergen Reactions   Macrobid [Nitrofurantoin Monohyd Macro] Other (See Comments)    Nausea, stomach cramps, fatigue , headache.   Meloxicam Other (See Comments)    Jittery and headache   Digoxin And Related     headaches   Ferrous Sulfate     Bad constipation    Keflex [Cephalexin] Nausea And Vomiting    Nausea, fatigue, and headache.   Mobic [Meloxicam] Other (See Comments)    Unsure of reaction type   Sulfamethoxazole-Trimethoprim Nausea Only    BP 108/64   Ht '5\' 3"'$  (1.6 m)   Wt 135 lb (61.2 kg)   BMI 23.91 kg/m       No data to display              No data to display              Objective:  Physical Exam:  Gen: NAD, significant bruising on her legs. Able to ambulate with mildly antalgic gait.  Right arm: Full ROM of the elbow without pain. Palpation is normal over metacarpals, scaphoid, lunate, and digits. Full ROM in fingers and wrists. No obvious deformities.  Right Hip: Bruising over greater trochanter. Hematoma palpable. Full internal and external rotation without pain.  Left Knee: Bruising and hematoma over the inferior portion of the left knee.  Low Back: 5/5 strength in L4-S1 nerve distributions. Weak hip abduction.    Assessment & Plan:  2 falls within 1 week. Concern for spinal stenosis causing lower extremity weakness leading to falls. No obvious concern for fractures though she has significant bruising and hematomas due to Eliquis. Recommend gait training for fall prevention. Use Tylenol and ice as needed for pain. Patient can follow up as needed.  Virgel Manifold, MS4  I observed and examined the patient with the Medical student and agree with assessment and plan.  Note reviewed and modified by me.  She is high risk for falls and I referred her for  gait training and fall prevention.  Still active and lives at home with her husband. Ila Mcgill, MD

## 2022-04-19 NOTE — Assessment & Plan Note (Signed)
I recommended evaluation for fall risk and for gait retraining. She and her husband are both agreeable to this. We have tried to help her with some of her hip girdle weakness and formal PT may be needed to also lessen her risk of falls.  She should treat the contusions conservatively with ice and with good skin care.

## 2022-04-23 ENCOUNTER — Encounter: Payer: Self-pay | Admitting: Family Medicine

## 2022-04-23 ENCOUNTER — Ambulatory Visit: Payer: Medicare Other | Admitting: Family Medicine

## 2022-04-23 VITALS — BP 111/73 | HR 73 | Temp 97.6°F | Ht 60.0 in | Wt 137.6 lb

## 2022-04-23 DIAGNOSIS — L0390 Cellulitis, unspecified: Secondary | ICD-10-CM | POA: Diagnosis not present

## 2022-04-23 DIAGNOSIS — I10 Essential (primary) hypertension: Secondary | ICD-10-CM

## 2022-04-23 DIAGNOSIS — I4821 Permanent atrial fibrillation: Secondary | ICD-10-CM

## 2022-04-23 MED ORDER — DOXYCYCLINE HYCLATE 100 MG PO TABS: 100.0000 mg | ORAL_TABLET | Freq: Two times a day (BID) | ORAL | 0 refills | Status: DC

## 2022-04-23 NOTE — Progress Notes (Signed)
   Hailey [name] is a 87 y.o. female who presents today for an office visit.  Assessment/Plan:  New/Acute Problems: Cellulitis No red flags or sings of systemic illness.  We will start doxycycline.  She can continue topical bacitracin.  She will keep the area clean and dry.  We discussed wound care.  We discussed reasons to return to care.  Follow-up as needed.  Chronic Problems Addressed Today: HTN -at goal today on losartan 25 mg daily, metoprolol succinzte '25mg'$  daily, and spironolactone 12.'5mg'$  once daily.  PAF - Anticoagulated on elliquis. No signs bleeding or oozing today.     Subjective:  HPI:  Patient here with multiple skin tears on left hand and right leg secondary to a fall a couple of weeks ago.  She saw sports medicine last week ago who evaluated her for her falls.  She was recommended to start physical therapy.  They have not contacted her yet.  She has several skin tears that seem to be healing however over the last day or so she has noticed a skin tear on her left finger seems to be becoming more painful and swelling.  She has had some itching and drainage to the area as well.       Objective:  Physical Exam: BP 111/73   Pulse 73   Temp 97.6 F (36.4 C) (Temporal)   Ht 5' (1.524 m)   Wt 137 lb 9.6 oz (62.4 kg)   SpO2 98%   BMI 26.87 kg/m   Gen: No acute distress, resting comfortably CV: Regular rate and rhythm with no murmurs appreciated Pulm: Normal work of breathing, clear to auscultation bilaterally with no crackles, wheezes, or rhonchi Skin: Skin tear on left finger with surrounding erythema and sloughing of skin.  Neurovascular intact distally.  Good cap refill and sensation in distal finger. Neuro: Grossly normal, moves all extremities Psych: Normal affect and thought content      Dorn Hartshorne M. Jerline Pain, MD 04/23/2022 3:18 PM

## 2022-04-23 NOTE — Patient Instructions (Signed)
It was very nice to see you today!  Please start the doxycycline.  Please let us know if the redness or pain worsens.  Take care, Dr Jerline Pain  PLEASE NOTE:  If you had any lab tests please let us know if you have not heard back within a few days. You may see your results on mychart before we have a chance to review them but we will give you a call once they are reviewed by [place]. If we ordered any referrals today, please let us know if you have not heard from their office within the next week.   Please try these tips to maintain a healthy lifestyle:  Eat at least 3 REAL meals and 1-2 snacks per day.  Aim for no more than 5 hours between eating.  If you eat breakfast, please do so within one hour of getting up.   Each meal should contain half fruits/vegetables, one quarter protein, and one quarter carbs (no bigger than a computer mouse)  Cut down on sweet beverages. This includes juice, soda, and sweet tea.   Drink at least 1 glass of water with each meal and aim for at least 8 glasses per day  Exercise at least 150 minutes every week.

## 2022-04-24 ENCOUNTER — Encounter (HOSPITAL_COMMUNITY): Payer: Self-pay

## 2022-04-24 ENCOUNTER — Inpatient Hospital Stay (HOSPITAL_COMMUNITY)
Admission: EM | Admit: 2022-04-24 | Discharge: 2022-04-27 | DRG: 812 | Disposition: A | Discharge status: Home / Self Care | Payer: Medicare Other | Attending: Internal Medicine | Admitting: Internal Medicine

## 2022-04-24 ENCOUNTER — Emergency Department (HOSPITAL_COMMUNITY): Payer: Medicare Other

## 2022-04-24 ENCOUNTER — Other Ambulatory Visit: Payer: Self-pay

## 2022-04-24 DIAGNOSIS — Z79899 Other long term (current) drug therapy: Secondary | ICD-10-CM

## 2022-04-24 DIAGNOSIS — F419 Anxiety disorder, unspecified: Secondary | ICD-10-CM | POA: Diagnosis present

## 2022-04-24 DIAGNOSIS — I951 Orthostatic hypotension: Secondary | ICD-10-CM | POA: Diagnosis present

## 2022-04-24 DIAGNOSIS — E860 Dehydration: Secondary | ICD-10-CM | POA: Diagnosis not present

## 2022-04-24 DIAGNOSIS — R2241 Localized swelling, mass and lump, right lower limb: Secondary | ICD-10-CM | POA: Diagnosis not present

## 2022-04-24 DIAGNOSIS — R296 Repeated falls: Secondary | ICD-10-CM | POA: Diagnosis present

## 2022-04-24 DIAGNOSIS — R42 Dizziness and giddiness: Secondary | ICD-10-CM | POA: Diagnosis not present

## 2022-04-24 DIAGNOSIS — D62 Acute posthemorrhagic anemia: Secondary | ICD-10-CM | POA: Diagnosis not present

## 2022-04-24 DIAGNOSIS — Z833 Family history of diabetes mellitus: Secondary | ICD-10-CM

## 2022-04-24 DIAGNOSIS — Z955 Presence of coronary angioplasty implant and graft: Secondary | ICD-10-CM

## 2022-04-24 DIAGNOSIS — M8580 Other specified disorders of bone density and structure, unspecified site: Secondary | ICD-10-CM | POA: Diagnosis present

## 2022-04-24 DIAGNOSIS — Z86010 Personal history of colonic polyps: Secondary | ICD-10-CM | POA: Diagnosis not present

## 2022-04-24 DIAGNOSIS — D681 Hereditary factor XI deficiency: Secondary | ICD-10-CM | POA: Diagnosis present

## 2022-04-24 DIAGNOSIS — Z87891 Personal history of nicotine dependence: Secondary | ICD-10-CM

## 2022-04-24 DIAGNOSIS — Z87412 Personal history of vulvar dysplasia: Secondary | ICD-10-CM | POA: Diagnosis not present

## 2022-04-24 DIAGNOSIS — Z7901 Long term (current) use of anticoagulants: Secondary | ICD-10-CM | POA: Diagnosis not present

## 2022-04-24 DIAGNOSIS — Z743 Need for continuous supervision: Secondary | ICD-10-CM | POA: Diagnosis not present

## 2022-04-24 DIAGNOSIS — R404 Transient alteration of awareness: Secondary | ICD-10-CM | POA: Diagnosis not present

## 2022-04-24 DIAGNOSIS — D649 Anemia, unspecified: Secondary | ICD-10-CM | POA: Diagnosis not present

## 2022-04-24 DIAGNOSIS — N1832 Chronic kidney disease, stage 3b: Secondary | ICD-10-CM | POA: Diagnosis not present

## 2022-04-24 DIAGNOSIS — W010XXA Fall on same level from slipping, tripping and stumbling without subsequent striking against object, initial encounter: Secondary | ICD-10-CM | POA: Diagnosis present

## 2022-04-24 DIAGNOSIS — R531 Weakness: Secondary | ICD-10-CM

## 2022-04-24 DIAGNOSIS — Z888 Allergy status to other drugs, medicaments and biological substances status: Secondary | ICD-10-CM | POA: Diagnosis not present

## 2022-04-24 DIAGNOSIS — M419 Scoliosis, unspecified: Secondary | ICD-10-CM | POA: Diagnosis not present

## 2022-04-24 DIAGNOSIS — I2510 Atherosclerotic heart disease of native coronary artery without angina pectoris: Secondary | ICD-10-CM | POA: Diagnosis not present

## 2022-04-24 DIAGNOSIS — Z881 Allergy status to other antibiotic agents status: Secondary | ICD-10-CM | POA: Diagnosis not present

## 2022-04-24 DIAGNOSIS — I720 Aneurysm of carotid artery: Secondary | ICD-10-CM | POA: Diagnosis not present

## 2022-04-24 DIAGNOSIS — Z800 Family history of malignant neoplasm of digestive organs: Secondary | ICD-10-CM | POA: Diagnosis not present

## 2022-04-24 DIAGNOSIS — L089 Local infection of the skin and subcutaneous tissue, unspecified: Secondary | ICD-10-CM | POA: Diagnosis not present

## 2022-04-24 DIAGNOSIS — Z043 Encounter for examination and observation following other accident: Secondary | ICD-10-CM | POA: Diagnosis not present

## 2022-04-24 DIAGNOSIS — S7011XA Contusion of right thigh, initial encounter: Secondary | ICD-10-CM | POA: Diagnosis present

## 2022-04-24 DIAGNOSIS — I4821 Permanent atrial fibrillation: Secondary | ICD-10-CM | POA: Diagnosis present

## 2022-04-24 DIAGNOSIS — G4489 Other headache syndrome: Secondary | ICD-10-CM | POA: Diagnosis not present

## 2022-04-24 DIAGNOSIS — I10 Essential (primary) hypertension: Secondary | ICD-10-CM | POA: Diagnosis present

## 2022-04-24 DIAGNOSIS — Z8042 Family history of malignant neoplasm of prostate: Secondary | ICD-10-CM | POA: Diagnosis not present

## 2022-04-24 DIAGNOSIS — I129 Hypertensive chronic kidney disease with stage 1 through stage 4 chronic kidney disease, or unspecified chronic kidney disease: Secondary | ICD-10-CM | POA: Diagnosis not present

## 2022-04-24 DIAGNOSIS — M4312 Spondylolisthesis, cervical region: Secondary | ICD-10-CM | POA: Diagnosis not present

## 2022-04-24 DIAGNOSIS — S7010XA Contusion of unspecified thigh, initial encounter: Secondary | ICD-10-CM | POA: Diagnosis present

## 2022-04-24 LAB — I-STAT CHEM 8, ED
BUN: 26 mg/dL — ABNORMAL HIGH (ref 8–23)
Calcium, Ion: 1.12 mmol/L — ABNORMAL LOW (ref 1.15–1.40)
Chloride: 108 mmol/L (ref 98–111)
Creatinine, Ser: 1.2 mg/dL — ABNORMAL HIGH (ref 0.44–1.00)
Glucose, Bld: 98 mg/dL (ref 70–99)
HCT: 26 % — ABNORMAL LOW (ref 36.0–46.0)
Hemoglobin: 8.8 g/dL — ABNORMAL LOW (ref 12.0–15.0)
Potassium: 4.2 mmol/L (ref 3.5–5.1)
Sodium: 136 mmol/L (ref 135–145)
TCO2: 19 mmol/L — ABNORMAL LOW (ref 22–32)

## 2022-04-24 LAB — COMPREHENSIVE METABOLIC PANEL
ALT: 9 U/L (ref 0–44)
AST: 19 U/L (ref 15–41)
Albumin: 3 g/dL — ABNORMAL LOW (ref 3.5–5.0)
Alkaline Phosphatase: 47 U/L (ref 38–126)
Anion gap: 8 (ref 5–15)
BUN: 24 mg/dL — ABNORMAL HIGH (ref 8–23)
CO2: 18 mmol/L — ABNORMAL LOW (ref 22–32)
Calcium: 8.2 mg/dL — ABNORMAL LOW (ref 8.9–10.3)
Chloride: 110 mmol/L (ref 98–111)
Creatinine, Ser: 1.22 mg/dL — ABNORMAL HIGH (ref 0.44–1.00)
GFR, Estimated: 43 mL/min — ABNORMAL LOW (ref >60)
Glucose, Bld: 106 mg/dL — ABNORMAL HIGH (ref 70–99)
Potassium: 4.2 mmol/L (ref 3.5–5.1)
Sodium: 136 mmol/L (ref 135–145)
Total Bilirubin: 0.6 mg/dL (ref 0.3–1.2)
Total Protein: 5.3 g/dL — ABNORMAL LOW (ref 6.5–8.1)

## 2022-04-24 LAB — CBC WITH DIFFERENTIAL/PLATELET
Abs Immature Granulocytes: 0.06 10*3/uL (ref 0.00–0.07)
Basophils Absolute: 0 10*3/uL (ref 0.0–0.1)
Basophils Relative: 0 %
Eosinophils Absolute: 0.5 10*3/uL (ref 0.0–0.5)
Eosinophils Relative: 7 %
HCT: 29.6 % — ABNORMAL LOW (ref 36.0–46.0)
Hemoglobin: 9.4 g/dL — ABNORMAL LOW (ref 12.0–15.0)
Immature Granulocytes: 1 %
Lymphocytes Relative: 18 %
Lymphs Abs: 1.4 10*3/uL (ref 0.7–4.0)
MCH: 32.8 pg (ref 26.0–34.0)
MCHC: 31.8 g/dL (ref 30.0–36.0)
MCV: 103.1 fL — ABNORMAL HIGH (ref 80.0–100.0)
Monocytes Absolute: 0.8 10*3/uL (ref 0.1–1.0)
Monocytes Relative: 10 %
Neutro Abs: 4.9 10*3/uL (ref 1.7–7.7)
Neutrophils Relative %: 64 %
Platelets: 210 10*3/uL (ref 150–400)
RBC: 2.87 MIL/uL — ABNORMAL LOW (ref 3.87–5.11)
RDW: 14.5 % (ref 11.5–15.5)
WBC: 7.7 10*3/uL (ref 4.0–10.5)
nRBC: 0 % (ref 0.0–0.2)

## 2022-04-24 LAB — ETHANOL: Alcohol, Ethyl (B): <10 mg/dL (ref <10)

## 2022-04-24 MED ORDER — MECLIZINE HCL 25 MG PO TABS
12.5000 mg | ORAL_TABLET | Freq: Once | ORAL | Status: AC
  Administered 2022-04-24: 12.5 mg via ORAL
  Filled 2022-04-24: qty 1

## 2022-04-24 MED ORDER — SODIUM CHLORIDE 0.9 % IV BOLUS
1000.0000 mL | Freq: Once | INTRAVENOUS | Status: AC
  Administered 2022-04-25: 1000 mL via INTRAVENOUS

## 2022-04-24 MED ORDER — PROCHLORPERAZINE EDISYLATE 10 MG/2ML IJ SOLN
5.0000 mg | Freq: Once | INTRAMUSCULAR | Status: AC
  Administered 2022-04-24: 5 mg via INTRAVENOUS
  Filled 2022-04-24: qty 2

## 2022-04-24 MED ORDER — SODIUM CHLORIDE 0.9 % IV BOLUS
1000.0000 mL | Freq: Once | INTRAVENOUS | Status: AC
  Administered 2022-04-24: 1000 mL via INTRAVENOUS

## 2022-04-24 MED ORDER — DIPHENHYDRAMINE HCL 50 MG/ML IJ SOLN
12.5000 mg | Freq: Once | INTRAMUSCULAR | Status: AC
  Administered 2022-04-24: 12.5 mg via INTRAVENOUS
  Filled 2022-04-24: qty 1

## 2022-04-24 MED ORDER — IOHEXOL 350 MG/ML SOLN
75.0000 mL | Freq: Once | INTRAVENOUS | Status: AC | PRN
Start: 2022-04-24 — End: 2022-04-24
  Administered 2022-04-24: 75 mL via INTRAVENOUS

## 2022-04-24 NOTE — ED Notes (Signed)
Patient transported to CT 

## 2022-04-24 NOTE — ED Provider Notes (Addendum)
[facility] EMERGENCY DEPARTMENT Provider Note   CSN: [id number] Arrival date & time: 04/24/22  1925     History  Chief Complaint  Patient presents with   Lytle Michaels    Hailey [name] is a 87 y.o. female.  87 yo F with a chief complaints of frequent falls.  She tells me that she has fallen multiple times over the past week and a half or so.  She went to see her family doctor yesterday for a skin infection of the finger.  She is started on doxycycline and thinks that is actually looking better today.  She unfortunately suffered 3 falls today.  The first when she think she tripped over her husband's feet and then the next 2 she thinks also that she lost her balance.  She has been trying to rest most of the day and then when she tried to get up off the couch realized that she was too dizzy to do so.  Her husband then called 911.  She is having a bit of a headache and has some right-sided neck pain.  She denies one-sided numbness or weakness denies difficulty speech or swallowing.   Fall       Home Medications Prior to Admission medications   Medication Sig Start Date End Date Taking? Authorizing Provider  acetaminophen (TYLENOL) 500 MG tablet Take 1,000 mg by mouth every 6 (six) hours as needed (body aches / sore hip). Maximum of 6 tablets daily ('3000mg'$ )    [provider]  apixaban (ELIQUIS) 2.5 MG TABS tablet Take 1 tablet (2.5 mg total) by mouth 2 (two) times daily. 03/06/22   Belva Crome, MD  atorvastatin (LIPITOR) 40 MG tablet Take 1 tablet (40 mg total) by mouth every Monday, Wednesday, and Friday. 12/04/21   Swinyer, [name], NP  BIOTIN PO Take 5,000 mcg by mouth in the morning.    [provider]  Cholecalciferol (VITAMIN D3) 50 MCG (2000 UT) capsule Take 1 capsule (2,000 Units total) by mouth daily. 11/22/21   Plotnikov, Evie Lacks, MD  clobetasol ointment (TEMOVATE) 9.93 % Apply 1 application topically to affected area twice daily for 2 weeks  and then reduce to twice a week at night. 11/21/21   Nunzio Cobbs, MD  Cyanocobalamin (VITAMIN B-12) 1000 MCG SUBL Place 1 tablet (1,000 mcg total) under the tongue daily. 11/22/21   Plotnikov, Evie Lacks, MD  diclofenac Sodium (VOLTAREN) 1 % GEL Apply 2 g topically 4 (four) times daily as needed (back pain.).    [provider]  dicyclomine (BENTYL) 20 MG tablet Take 1 by mouth every 4-6 hours as needed for cramping 08/16/21   Irene Shipper, MD  doxycycline (VIBRA-TABS) 100 MG tablet Take 1 tablet (100 mg total) by mouth 2 (two) times daily. 04/23/22   Vivi Barrack, MD  DULoxetine (CYMBALTA) 60 MG capsule Take 1 capsule (60 mg total) by mouth daily. Overdue for annual physical with labs 04/09/22   Plotnikov, Evie Lacks, MD  empagliflozin (JARDIANCE) 10 MG TABS tablet Take 1 tablet (10 mg total) by mouth daily before breakfast. 03/06/22   Swinyer, [name], NP  esomeprazole (NEXIUM) 40 MG capsule TAKE ONE CAPSULE ONCE DAILY. 01/30/22   Plotnikov, Evie Lacks, MD  Homeopathic Products (ARNICARE) GEL Apply 1 application topically daily as needed (skin bruising).    [provider]  hyoscyamine (LEVSIN) 0.125 MG tablet TAKE 1-2 TABLETS (0.125-0.25 MG) BY MOUTH EVERY 4 HOURS AS NEEDED FOR  UP TO 10 DAYS FOR CRAMPING. Patient taking differently: Take 0.125-0.25 mg by mouth every 4 (four) hours as needed for cramping. 03/27/20   Plotnikov, Evie Lacks, MD  LORazepam (ATIVAN) 1 MG tablet TAKE ONE TABLET BY MOUTH TWICE DAILY AS NEEDED FOR ANXIETY / SLEEP. Patient taking differently: Take 1-1.5 mg by mouth at bedtime. 07/28/21   Plotnikov, Evie Lacks, MD  losartan (COZAAR) 25 MG tablet TAKE 1 TABLET ONCE DAILY. Patient taking differently: Take 25 mg by mouth daily. 07/25/21   Belva Crome, MD  metoprolol succinate (TOPROL-XL) 25 MG 24 hr tablet Take 1 tablet (25 mg total) by mouth daily. 07/25/21   Belva Crome, MD  Multiple Vitamins-Minerals (PRESERVISION AREDS 2+MULTI VIT) CAPS Take  1 capsule by mouth 2 (two) times daily.    [provider]  nitroGLYCERIN (NITROSTAT) 0.4 MG SL tablet Place 0.4 mg under the tongue every 5 (five) minutes as needed for chest pain (Call 911 at 3rd dose within 15 minutes.).    [provider]  ondansetron (ZOFRAN-ODT) 8 MG disintegrating tablet Take 1 tablet (8 mg total) by mouth every 8 (eight) hours as needed for nausea or vomiting. 02/23/22   Dorie Rank, MD  Peppermint Oil (IBGARD) 90 MG CPCR Take 1 capsule before meals Patient taking differently: Take 1 capsule by mouth 2 (two) times daily before a meal. 06/28/21   Irene Shipper, MD  Polyethyl Glycol-Propyl Glycol (LUBRICANT EYE DROPS) 0.4-0.3 % SOLN Place 1-2 drops into both eyes 3 (three) times daily as needed (dry/irritated eyes.).    [provider]  polyethylene glycol (MIRALAX / GLYCOLAX) 17 g packet Take 17 g by mouth daily.    [provider]  saccharomyces boulardii (FLORASTOR) 250 MG capsule Take 1 capsule (250 mg total) by mouth 2 (two) times daily. Patient taking differently: Take 250 mg by mouth daily. 08/30/20   Oswald Hillock, MD  sodium chloride (OCEAN) 0.65 % SOLN nasal spray Place 1 spray into both nostrils at bedtime as needed for congestion.    [provider]  spironolactone (ALDACTONE) 25 MG tablet Take 0.5 tablets (12.5 mg total) by mouth daily. Patient taking differently: Take 12.5 mg by mouth every Monday, Wednesday, and Friday. 07/04/21   Belva Crome, MD      Allergies    Macrobid [nitrofurantoin monohyd macro], Meloxicam, Digoxin and related, Ferrous sulfate, Keflex [cephalexin], Mobic [meloxicam], and Sulfamethoxazole-trimethoprim    Review of Systems   Review of Systems  Physical Exam Updated Vital Signs BP 92/82   Pulse 60   Temp 98.3 F (36.8 C) (Oral)   Resp 16   SpO2 100%  Physical Exam Vitals and nursing note reviewed.  Constitutional:      General: She is not in acute distress.    Appearance: She is  well-developed. She is not diaphoretic.  HENT:     Head: Normocephalic and atraumatic.  Eyes:     Pupils: Pupils are equal, round, and reactive to light.  Cardiovascular:     Rate and Rhythm: Normal rate and regular rhythm.     Heart sounds: No murmur heard.    No friction rub. No gallop.  Pulmonary:     Effort: Pulmonary effort is normal.     Breath sounds: No wheezing or rales.  Abdominal:     General: There is no distension.     Palpations: Abdomen is soft.     Tenderness: There is no abdominal tenderness.  Musculoskeletal:  General: No tenderness.     Cervical back: Normal range of motion and neck supple.  Skin:    General: Skin is warm and dry.  Neurological:     Mental Status: She is alert and oriented to person, place, and time.     Cranial Nerves: Cranial nerves 2-12 are intact.     Sensory: Sensation is intact.     Motor: Motor function is intact.     Coordination: Coordination is intact.     Comments: Left-sided fast going nystagmus on far leftward gaze.  Otherwise benign neurologic exam.  Psychiatric:        Behavior: Behavior normal.     ED Results / Procedures / Treatments   Labs (all labs ordered are listed, but only abnormal results are displayed) Labs Reviewed  CBC WITH DIFFERENTIAL/PLATELET - Abnormal; Notable for the following components:      Result Value   RBC 2.87 (*)    Hemoglobin 9.4 (*)    HCT 29.6 (*)    MCV 103.1 (*)    All other components within normal limits  COMPREHENSIVE METABOLIC PANEL - Abnormal; Notable for the following components:   CO2 18 (*)    Glucose, Bld 106 (*)    BUN 24 (*)    Creatinine, Ser 1.22 (*)    Calcium 8.2 (*)    Total Protein 5.3 (*)    Albumin 3.0 (*)    GFR, Estimated 43 (*)    All other components within normal limits  I-STAT CHEM 8, ED - Abnormal; Notable for the following components:   BUN 26 (*)    Creatinine, Ser 1.20 (*)    Calcium, Ion 1.12 (*)    TCO2 19 (*)    Hemoglobin 8.8 (*)    HCT  26.0 (*)    All other components within normal limits  ETHANOL  CBG MONITORING, ED    EKG None  Radiology CT ANGIO HEAD NECK W WO CM  Result Date: 04/24/2022 CLINICAL DATA:  Chronic falls; weakness and dizziness EXAM: CT ANGIOGRAPHY HEAD AND NECK TECHNIQUE: Multidetector CT imaging of the head and neck was performed using the standard protocol during bolus administration of intravenous contrast. Multiplanar CT image reconstructions and MIPs were obtained to evaluate the vascular anatomy. Carotid stenosis measurements (when applicable) are obtained utilizing NASCET criteria, using the distal internal carotid diameter as the denominator. RADIATION DOSE REDUCTION: This exam was performed according to the departmental dose-optimization program which includes automated exposure control, adjustment of the mA and/or kV according to patient size and/or use of iterative reconstruction technique. CONTRAST:  36m OMNIPAQUE IOHEXOL 350 MG/ML SOLN COMPARISON:  11/19/2021 CT head no prior CTA FINDINGS: CT HEAD FINDINGS Brain: No evidence of acute infarct, hemorrhage, mass, mass effect, or midline shift. No hydrocephalus or extra-axial fluid collection. 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 Vascular: No hyperdense vessel. Skull: Normal. Negative for fracture or focal lesion. Sinuses/Orbits: Mild mucosal thickening in the anterior ethmoid air cells and left frontal sinus Other: The mastoid air cells are well aerated. CTA NECK FINDINGS Aortic arch: Standard branching. Imaged portion shows no evidence of aneurysm or dissection. No hemodynamically significant stenosis of the major arch vessel origins. Approximate 30% luminal narrowing of the left subclavian artery (series 14, image 113). Right carotid system: No evidence of dissection, occlusion, or hemodynamically significant stenosis (greater than 50%). In the distal right internal carotid artery, there is an area  of mild dilatation, measuring  up to 6 mm (series 12, image 143), compared to 3 mm in the more proximal ICA, returning closer to the normal caliber by the petrous segment. Left carotid system: No evidence of dissection, occlusion, or hemodynamically significant stenosis (greater than 50%). Vertebral arteries: No evidence of dissection, occlusion, or hemodynamically significant stenosis (greater than 50%). Skeleton: Degenerative changes in the cervical spine, with reversal of the normal cervical lordosis and trace anterolisthesis of C3 on C4. Trace retrolisthesis of C5 on C6. No acute osseous abnormality. Other neck: Negative. Upper chest: No focal pulmonary opacity or pleural effusion. Review of the MIP images confirms the above findings CTA HEAD FINDINGS Anterior circulation: Both internal carotid arteries are patent to the termini, with mild-to-moderate stenosis in the left supraclinoid ICA. A1 segments patent. Normal anterior communicating artery. Anterior cerebral arteries are patent to their distal aspects. No M1 stenosis or occlusion. MCA branches perfused and symmetric. Posterior circulation: Vertebral arteries patent to the vertebrobasilar junction without stenosis. Mild dilatation of the mid left V4, which measures 3 mm in the proximal left V4, enlarges to up to 4 mm, and then narrows to 2 mm just proximal to the vertebrobasilar junction. Basilar patent to its distal aspect. Superior cerebellar arteries patent proximally. Patent P1 segments. PCAs perfused to their distal aspects without stenosis. The bilateral posterior communicating arteries are not visualized. Venous sinuses: As permitted by contrast timing, patent. Anatomic variants: None significant. Review of the MIP images confirms the above findings IMPRESSION: 1. No acute intracranial process. 2. No intracranial large vessel occlusion. Mild-to-moderate stenosis in the left supraclinoid ICA. 3. No hemodynamically significant stenosis in the neck. 4.  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 Electronically Signed   By: Merilyn Baba M.D.   On: 04/24/2022 22:46   CT C-SPINE NO CHARGE  Result Date: 04/24/2022 CLINICAL DATA:  Fall. EXAM: CT CERVICAL SPINE WITHOUT CONTRAST TECHNIQUE: Multidetector CT imaging of the cervical spine was performed without intravenous contrast. Multiplanar CT image reconstructions were also generated. RADIATION DOSE REDUCTION: This exam was performed according to the departmental dose-optimization program which includes automated exposure control, adjustment of the mA and/or kV according to patient size and/or use of iterative reconstruction technique. COMPARISON:  Cervical spine CT 08/25/2019 FINDINGS: Alignment: There is 2 mm of anterolisthesis at C3-C4 which is unchanged and favored is degenerative. Alignment is otherwise anatomic. Skull base and vertebrae: No acute fracture. No primary bone lesion or focal pathologic process. Bones are osteopenic. Soft tissues and spinal canal: No prevertebral fluid or swelling. No visible canal hematoma. Disc levels: Disc space narrowing and endplate osteophyte formation is seen throughout the cervical spine from C4 through T1 similar to the prior study. There is no severe central canal or neural foraminal stenosis at any level. Upper chest: Negative. Other: None. IMPRESSION: No acute fracture or traumatic subluxation of the cervical spine. Electronically Signed   By: Ronney Asters M.D.   On: 04/24/2022 21:45    Procedures Procedures    Medications Ordered in ED Medications  sodium chloride 0.9 % bolus 1,000 mL (has no administration in time range)  sodium chloride 0.9 % bolus 1,000 mL (0 mLs Intravenous Stopped 04/24/22 2144)  prochlorperazine (COMPAZINE) injection 5 mg (5 mg Intravenous Given 04/24/22 1948)  diphenhydrAMINE (BENADRYL) injection 12.5 mg (12.5 mg Intravenous Given 04/24/22 1947)  meclizine  (ANTIVERT) tablet 12.5 mg (12.5 mg Oral Given 04/24/22 1950)  iohexol (OMNIPAQUE) 350 MG/ML injection 75 mL (75  mLs Intravenous Contrast Given 04/24/22 2140)    ED Course/ Medical Decision Making/ A&P                           Medical Decision Making Amount and/or Complexity of Data Reviewed Labs: ordered. Radiology: ordered.  Risk Prescription drug management. Decision regarding hospitalization.   87 yo F with a chief complaints of dizziness.  This occurred after falling 3 times earlier today.  The patient tells me that she did strike her head.  Has been having some right posterior neck pain as well.  Seems less likely to be vertebral artery dissection, will obtain a CTA to evaluate further.  Lab work bolus of IV fluids symptomatic therapy reassess.  Patient with mild drop in her hemoglobin from baseline, no significant electrolyte abnormality no leukocytosis EtOH negative.  CT angiogram of the head and neck without intracranial hemorrhage, no C-spine fracture.  No obvious stenosis.  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  Patient reassessed and feeling much better.  She would like to try and get up and walk.  Unfortunately upon standing the patient was again acutely dizzy.  We will obtain an MRI of the brain to assess for posterior circulation stroke.  Will discuss with neurology.  Will discuss with medicine for admission.  I discussed case with Dr. Curly Shores, agreed with going ahead with MRI.  If negative recommended PT OT for vestibular evaluation.  If positive then she would see the patient bedside. The patients results and plan were reviewed and discussed.   Any x-rays performed were independently reviewed by myself.   Differential diagnosis were considered with the presenting HPI.  Medications  sodium chloride 0.9 % bolus 1,000 mL (has no administration in time range)  sodium chloride 0.9 % bolus 1,000 mL (0 mLs Intravenous Stopped  04/24/22 2144)  prochlorperazine (COMPAZINE) injection 5 mg (5 mg Intravenous Given 04/24/22 1948)  diphenhydrAMINE (BENADRYL) injection 12.5 mg (12.5 mg Intravenous Given 04/24/22 1947)  meclizine (ANTIVERT) tablet 12.5 mg (12.5 mg Oral Given 04/24/22 1950)  iohexol (OMNIPAQUE) 350 MG/ML injection 75 mL (75 mLs Intravenous Contrast Given 04/24/22 2140)    Vitals:   04/24/22 2000 04/24/22 2030 04/24/22 2045 04/24/22 2100  BP: 120/83 (!) 102/91 101/79 92/82  Pulse:    60  Resp:    16  Temp:      TempSrc:      SpO2:    100%    Final diagnoses:  Frequent falls  Dizziness    Admission/ observation were discussed with the admitting physician, patient and/or family and they are comfortable with the plan.            Final Clinical Impression(s) / ED Diagnoses Final diagnoses:  Frequent falls  Dizziness    Rx / DC Orders ED Discharge Orders     None         Deno Etienne, DO 04/24/22 Castana, [place], DO 04/24/22 2324

## 2022-04-24 NOTE — ED Triage Notes (Signed)
Per EMS patient has chronic falls and over the past 10 days the patient has had 3 falls due to generalized weakness/dizziness. Patient tripped and fell this AM and didn't call 911. Patient complains of right thigh pain and busing on the right side. Patient reports that the dizziness and weakness got worse after her fall. Per EMS patient is usually able to walk around and had to have help going to the restroom earlier due to feeling like she's "going to pass out". Patinet has hx of a. Fib.Marland Kitchen

## 2022-04-25 ENCOUNTER — Observation Stay (HOSPITAL_COMMUNITY): Payer: Medicare Other

## 2022-04-25 ENCOUNTER — Encounter (HOSPITAL_COMMUNITY): Payer: Self-pay | Admitting: Internal Medicine

## 2022-04-25 DIAGNOSIS — L089 Local infection of the skin and subcutaneous tissue, unspecified: Secondary | ICD-10-CM | POA: Diagnosis present

## 2022-04-25 DIAGNOSIS — Z7901 Long term (current) use of anticoagulants: Secondary | ICD-10-CM | POA: Diagnosis not present

## 2022-04-25 DIAGNOSIS — I129 Hypertensive chronic kidney disease with stage 1 through stage 4 chronic kidney disease, or unspecified chronic kidney disease: Secondary | ICD-10-CM | POA: Diagnosis present

## 2022-04-25 DIAGNOSIS — Z833 Family history of diabetes mellitus: Secondary | ICD-10-CM | POA: Diagnosis not present

## 2022-04-25 DIAGNOSIS — N1832 Chronic kidney disease, stage 3b: Secondary | ICD-10-CM | POA: Diagnosis present

## 2022-04-25 DIAGNOSIS — Z955 Presence of coronary angioplasty implant and graft: Secondary | ICD-10-CM | POA: Diagnosis not present

## 2022-04-25 DIAGNOSIS — Z881 Allergy status to other antibiotic agents status: Secondary | ICD-10-CM | POA: Diagnosis not present

## 2022-04-25 DIAGNOSIS — S7010XA Contusion of unspecified thigh, initial encounter: Secondary | ICD-10-CM | POA: Diagnosis present

## 2022-04-25 DIAGNOSIS — D62 Acute posthemorrhagic anemia: Secondary | ICD-10-CM | POA: Diagnosis present

## 2022-04-25 DIAGNOSIS — Z888 Allergy status to other drugs, medicaments and biological substances status: Secondary | ICD-10-CM | POA: Diagnosis not present

## 2022-04-25 DIAGNOSIS — F419 Anxiety disorder, unspecified: Secondary | ICD-10-CM | POA: Diagnosis present

## 2022-04-25 DIAGNOSIS — Z87891 Personal history of nicotine dependence: Secondary | ICD-10-CM | POA: Diagnosis not present

## 2022-04-25 DIAGNOSIS — R42 Dizziness and giddiness: Secondary | ICD-10-CM | POA: Diagnosis not present

## 2022-04-25 DIAGNOSIS — D649 Anemia, unspecified: Secondary | ICD-10-CM | POA: Diagnosis not present

## 2022-04-25 DIAGNOSIS — R2241 Localized swelling, mass and lump, right lower limb: Secondary | ICD-10-CM | POA: Diagnosis not present

## 2022-04-25 DIAGNOSIS — S7011XA Contusion of right thigh, initial encounter: Secondary | ICD-10-CM | POA: Diagnosis present

## 2022-04-25 DIAGNOSIS — D681 Hereditary factor XI deficiency: Secondary | ICD-10-CM | POA: Diagnosis present

## 2022-04-25 DIAGNOSIS — M419 Scoliosis, unspecified: Secondary | ICD-10-CM | POA: Diagnosis present

## 2022-04-25 DIAGNOSIS — I2510 Atherosclerotic heart disease of native coronary artery without angina pectoris: Secondary | ICD-10-CM | POA: Diagnosis present

## 2022-04-25 DIAGNOSIS — I951 Orthostatic hypotension: Secondary | ICD-10-CM

## 2022-04-25 DIAGNOSIS — R296 Repeated falls: Secondary | ICD-10-CM | POA: Diagnosis present

## 2022-04-25 DIAGNOSIS — I4821 Permanent atrial fibrillation: Secondary | ICD-10-CM | POA: Diagnosis present

## 2022-04-25 DIAGNOSIS — R531 Weakness: Secondary | ICD-10-CM | POA: Diagnosis not present

## 2022-04-25 DIAGNOSIS — Z8042 Family history of malignant neoplasm of prostate: Secondary | ICD-10-CM | POA: Diagnosis not present

## 2022-04-25 DIAGNOSIS — Z79899 Other long term (current) drug therapy: Secondary | ICD-10-CM | POA: Diagnosis not present

## 2022-04-25 DIAGNOSIS — Z800 Family history of malignant neoplasm of digestive organs: Secondary | ICD-10-CM | POA: Diagnosis not present

## 2022-04-25 DIAGNOSIS — Z86010 Personal history of colonic polyps: Secondary | ICD-10-CM | POA: Diagnosis not present

## 2022-04-25 DIAGNOSIS — M8580 Other specified disorders of bone density and structure, unspecified site: Secondary | ICD-10-CM | POA: Diagnosis present

## 2022-04-25 DIAGNOSIS — W010XXA Fall on same level from slipping, tripping and stumbling without subsequent striking against object, initial encounter: Secondary | ICD-10-CM | POA: Diagnosis present

## 2022-04-25 DIAGNOSIS — Z87412 Personal history of vulvar dysplasia: Secondary | ICD-10-CM | POA: Diagnosis not present

## 2022-04-25 LAB — CBC
HCT: 22 % — ABNORMAL LOW (ref 36.0–46.0)
Hemoglobin: 7.1 g/dL — ABNORMAL LOW (ref 12.0–15.0)
MCH: 32.6 pg (ref 26.0–34.0)
MCHC: 32.3 g/dL (ref 30.0–36.0)
MCV: 100.9 fL — ABNORMAL HIGH (ref 80.0–100.0)
Platelets: 183 10*3/uL (ref 150–400)
RBC: 2.18 MIL/uL — ABNORMAL LOW (ref 3.87–5.11)
RDW: 14.5 % (ref 11.5–15.5)
WBC: 6.4 10*3/uL (ref 4.0–10.5)
nRBC: 0 % (ref 0.0–0.2)

## 2022-04-25 LAB — HEMOGLOBIN AND HEMATOCRIT, BLOOD
HCT: 29.8 % — ABNORMAL LOW (ref 36.0–46.0)
HCT: 27.4 % — ABNORMAL LOW (ref 36.0–46.0)
Hemoglobin: 9.8 g/dL — ABNORMAL LOW (ref 12.0–15.0)
Hemoglobin: 9.2 g/dL — ABNORMAL LOW (ref 12.0–15.0)

## 2022-04-25 LAB — TROPONIN I (HIGH SENSITIVITY): Troponin I (High Sensitivity): 7 ng/L (ref <18)

## 2022-04-25 LAB — PREPARE RBC (CROSSMATCH)

## 2022-04-25 MED ORDER — ACETAMINOPHEN 325 MG PO TABS
650.0000 mg | ORAL_TABLET | ORAL | Status: DC | PRN
  Administered 2022-04-25 – 2022-04-26 (×2): 650 mg via ORAL
  Filled 2022-04-25 (×2): qty 2

## 2022-04-25 MED ORDER — ACETAMINOPHEN 650 MG RE SUPP: 650.0000 mg | Freq: Four times a day (QID) | RECTAL | Status: DC | PRN

## 2022-04-25 MED ORDER — HYDRALAZINE HCL 20 MG/ML IJ SOLN: 10.0000 mg | INTRAMUSCULAR | Status: DC | PRN

## 2022-04-25 MED ORDER — MORPHINE SULFATE (PF) 2 MG/ML IV SOLN
2.0000 mg | INTRAVENOUS | Status: DC | PRN
  Administered 2022-04-25 – 2022-04-27 (×5): 2 mg via INTRAVENOUS
  Filled 2022-04-25 (×5): qty 1

## 2022-04-25 MED ORDER — SODIUM CHLORIDE 0.9% IV SOLUTION: Freq: Once | INTRAVENOUS | Status: AC

## 2022-04-25 MED ORDER — OXYCODONE HCL 5 MG PO TABS
5.0000 mg | ORAL_TABLET | Freq: Once | ORAL | Status: AC
  Administered 2022-04-25: 5 mg via ORAL
  Filled 2022-04-25: qty 1

## 2022-04-25 MED ORDER — ACETAMINOPHEN 325 MG PO TABS
650.0000 mg | ORAL_TABLET | Freq: Four times a day (QID) | ORAL | Status: DC | PRN
  Administered 2022-04-25 (×2): 650 mg via ORAL
  Filled 2022-04-25 (×2): qty 2

## 2022-04-25 MED ORDER — ATORVASTATIN CALCIUM 40 MG PO TABS
40.0000 mg | ORAL_TABLET | ORAL | Status: DC
  Administered 2022-04-25 – 2022-04-27 (×2): 40 mg via ORAL
  Filled 2022-04-25 (×2): qty 1

## 2022-04-25 MED ORDER — HYDROMORPHONE HCL 1 MG/ML IJ SOLN
1.0000 mg | Freq: Once | INTRAMUSCULAR | Status: AC
  Administered 2022-04-26: 1 mg via INTRAVENOUS
  Filled 2022-04-25: qty 1

## 2022-04-25 MED ORDER — SODIUM CHLORIDE 0.9 % IV SOLN: INTRAVENOUS | Status: DC

## 2022-04-25 MED ORDER — OXYCODONE HCL 5 MG PO TABS
5.0000 mg | ORAL_TABLET | ORAL | Status: DC | PRN
  Administered 2022-04-25 – 2022-04-27 (×5): 5 mg via ORAL
  Filled 2022-04-25 (×6): qty 1

## 2022-04-25 NOTE — H&P (Signed)
History and Physical    Hailey [name] JSE:[id number] DOB: 26-Nov-1934 DOA: 04/24/2022  PCP: Cassandria Anger, MD  Patient coming from: Home.  Chief Complaint: Weakness.  HPI: Hailey [name] is a 87 y.o. female with history of CAD status post stenting, permanent atrial fibrillation, chronic kidney disease stage III, iron deficiency anemia and history of factor XI deficiency was brought to the ER after patient was found to be very weak.  Over the last 10 days patient had at least 3 falls.  Last one was yesterday morning.  As per the husband patient tripped on his foot and fell onto the floor but did not hit her head or lose consciousness.  Later in the evening around 5 PM patient was finding very difficult to ambulate to the bathroom and EMS was called.  ED Course: In the ER on exam patient does have a large swelling of the right hip area.  Initially there was some concern for possible stroke for which CT angiogram head and neck was showing no large vessel obstruction MRI brain was negative for stroke.  Patient's hemoglobin did drop from 12.9 in June 2023 and it is around 7.1 when it was rechecked in the ER.  Patient also orthostatic with blood pressure dropping by 50 points from 90 systolic to 50 on standing.  Patient admitted for generalized weakness likely from blood loss and volume loss with worsening anemia and large hematoma of the right thigh.  Review of Systems: As per HPI, rest all negative.   Past Medical History:  Diagnosis Date   Acute blood loss anemia 12/25/2017   Allergic rhinitis    Anxiety    Arthritis    "my whole spine" (07/01/2017)   Atrial fibrillation (HCC)    Bowel obstruction (HCC)    in [place]   Bradycardia with 41-50 beats per minute 12/27/2017   Cancer ([place])    Cellulitis of leg, right with large prepatella hematoma and open wounds 12/26/2017   CKD (chronic kidney disease) stage 2, GFR 60-89 ml/min    Colon polyps    Coronary artery disease    10/18  PCI/DES to p/m LCx with cutting balloon to mLcx   Diverticulosis of colon    GERD (gastroesophageal reflux disease)    Hip bursitis 2010   Dr Para March, Post op seroma   History of colon polyps    HSV (herpes simplex virus) anogenital infection 07/2019   HTN (hypertension)    IBS (irritable bowel syndrome)    constipation predominant - Dr Earlean Shawl   Lichen sclerosus    Osteopenia 11/2016   T score -2.0 FRAX 15%/4.3%   PAC (premature atrial contraction)    Symptomatiic   Renal insufficiency    Right bundle branch block (RBBB) on electrocardiogram (ECG) 12/27/2017   Scoliosis    SVT (supraventricular tachycardia) ([place])    brief history   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   biopsy showing vulvar atypia, possible VIN I    Past Surgical History:  Procedure Laterality Date   ANTERIOR AND POSTERIOR VAGINAL REPAIR  01/2002   Archie Endo 01/23/2011   APPENDECTOMY  1948   CARDIAC CATHETERIZATION  06/26/2017   CORONARY ANGIOPLASTY WITH STENT PLACEMENT  07/01/2017   CORONARY ATHERECTOMY N/A 07/01/2017   Procedure: CORONARY ATHERECTOMY;  Surgeon: Belva Crome, MD;  Location: [facility];  Service: Cardiovascular;  Laterality: N/A;   CORONARY STENT INTERVENTION N/A 07/01/2017   Procedure: CORONARY STENT INTERVENTION;  Surgeon: Belva Crome, MD;  Location: [facility];  Service: Cardiovascular;  Laterality: N/A;   HAMMER TOE SURGERY     HEMORRHOID BANDING     HIP SURGERY Left 04/2009   hip examination under anesthesia followed by greater trochanteric bursectomy; iliotibial band tenotomy/notes 01/20/2011   I & D EXTREMITY Right 01/10/2018   Procedure: IRRIGATION AND DEBRIDEMENT RIGHT KNEE, APPLY WOUND VAC;  Surgeon: Newt Minion, MD;  Location: [facility];  Service: Orthopedics;  Laterality: Right;   KNEE BURSECTOMY Right 04/2009   Archie Endo 01/09/2011   LEFT ATRIAL APPENDAGE OCCLUSION N/A 08/24/2021   Procedure: LEFT ATRIAL APPENDAGE OCCLUSION;  Surgeon: Sherren Mocha, MD;   Location: [facility];  Service: Cardiovascular;  Laterality: N/A;   LEFT HEART CATH AND CORONARY ANGIOGRAPHY N/A 06/26/2017   Procedure: LEFT HEART CATH AND CORONARY ANGIOGRAPHY;  Surgeon: Belva Crome, MD;  Location: [facility];  Service: Cardiovascular;  Laterality: N/A;   PUBOVAGINAL SLING  01/2002   Archie Endo 01/23/2011   REDUCTION MAMMAPLASTY     TEE WITHOUT CARDIOVERSION N/A 08/24/2021   Procedure: TRANSESOPHAGEAL ECHOCARDIOGRAM (TEE);  Surgeon: Sherren Mocha, MD;  Location: [facility];  Service: Cardiovascular;  Laterality: N/A;   TEMPORARY PACEMAKER N/A 07/01/2017   Procedure: TEMPORARY PACEMAKER;  Surgeon: Belva Crome, MD;  Location: [facility];  Service: Cardiovascular;  Laterality: N/A;   VAGINAL HYSTERECTOMY  01/2002   Vaginal hysterectomy, bilateral salpingo-oophorectomy/notes 01/23/2011     reports that she quit smoking about 42 years ago. Her smoking use included cigarettes. She has a 7.00 pack-year smoking history. She has never used smokeless tobacco. She reports current alcohol use. She reports that she does not use drugs.  Allergies  Allergen Reactions   Macrobid [Nitrofurantoin Monohyd Macro] Other (See Comments)    Nausea, stomach cramps, fatigue , headache.   Meloxicam Other (See Comments)    Jittery and headache   Digoxin And Related     headaches   Ferrous Sulfate     Bad constipation    Keflex [Cephalexin] Nausea And Vomiting    Nausea, fatigue, and headache.   Mobic [Meloxicam] Other (See Comments)    Unsure of reaction type   Sulfamethoxazole-Trimethoprim Nausea Only    Family History  Problem Relation Age of Onset   Colon cancer Mother        Dx age 64, died at age 49   Diabetes Father    Prostate cancer Father    Prostate cancer Brother    Pancreatic cancer Brother    Stomach cancer Son    Heart attack Neg Hx    Stroke Neg Hx    Esophageal cancer Neg Hx     Prior to Admission medications   Medication Sig  Start Date End Date Taking? Authorizing Provider  acetaminophen (TYLENOL) 500 MG tablet Take 1,000 mg by mouth every 6 (six) hours as needed (body aches / sore hip). Maximum of 6 tablets daily ('3000mg'$ )    [provider]  apixaban (ELIQUIS) 2.5 MG TABS tablet Take 1 tablet (2.5 mg total) by mouth 2 (two) times daily. 03/06/22   Belva Crome, MD  atorvastatin (LIPITOR) 40 MG tablet Take 1 tablet (40 mg total) by mouth every Monday, Wednesday, and Friday. 12/04/21   Swinyer, [name], NP  BIOTIN PO Take 5,000 mcg by mouth in the morning.    [provider]  Cholecalciferol (VITAMIN D3) 50 MCG (2000 UT) capsule Take 1 capsule (2,000 Units total) by mouth daily.  11/22/21   Plotnikov, Evie Lacks, MD  clobetasol ointment (TEMOVATE) 1.47 % Apply 1 application topically to affected area twice daily for 2 weeks and then reduce to twice a week at night. 11/21/21   Nunzio Cobbs, MD  Cyanocobalamin (VITAMIN B-12) 1000 MCG SUBL Place 1 tablet (1,000 mcg total) under the tongue daily. 11/22/21   Plotnikov, Evie Lacks, MD  diclofenac Sodium (VOLTAREN) 1 % GEL Apply 2 g topically 4 (four) times daily as needed (back pain.).    [provider]  dicyclomine (BENTYL) 20 MG tablet Take 1 by mouth every 4-6 hours as needed for cramping 08/16/21   Irene Shipper, MD  doxycycline (VIBRA-TABS) 100 MG tablet Take 1 tablet (100 mg total) by mouth 2 (two) times daily. 04/23/22   Vivi Barrack, MD  DULoxetine (CYMBALTA) 60 MG capsule Take 1 capsule (60 mg total) by mouth daily. Overdue for annual physical with labs 04/09/22   Plotnikov, Evie Lacks, MD  empagliflozin (JARDIANCE) 10 MG TABS tablet Take 1 tablet (10 mg total) by mouth daily before breakfast. 03/06/22   Swinyer, [name], NP  esomeprazole (NEXIUM) 40 MG capsule TAKE ONE CAPSULE ONCE DAILY. 01/30/22   Plotnikov, Evie Lacks, MD  Homeopathic Products (ARNICARE) GEL Apply 1 application topically daily as needed (skin bruising).     [provider]  hyoscyamine (LEVSIN) 0.125 MG tablet TAKE 1-2 TABLETS (0.125-0.25 MG) BY MOUTH EVERY 4 HOURS AS NEEDED FOR UP TO 10 DAYS FOR CRAMPING. Patient taking differently: Take 0.125-0.25 mg by mouth every 4 (four) hours as needed for cramping. 03/27/20   Plotnikov, Evie Lacks, MD  LORazepam (ATIVAN) 1 MG tablet TAKE ONE TABLET BY MOUTH TWICE DAILY AS NEEDED FOR ANXIETY / SLEEP. Patient taking differently: Take 1-1.5 mg by mouth at bedtime. 07/28/21   Plotnikov, Evie Lacks, MD  losartan (COZAAR) 25 MG tablet TAKE 1 TABLET ONCE DAILY. Patient taking differently: Take 25 mg by mouth daily. 07/25/21   Belva Crome, MD  metoprolol succinate (TOPROL-XL) 25 MG 24 hr tablet Take 1 tablet (25 mg total) by mouth daily. 07/25/21   Belva Crome, MD  Multiple Vitamins-Minerals (PRESERVISION AREDS 2+MULTI VIT) CAPS Take 1 capsule by mouth 2 (two) times daily.    [provider]  nitroGLYCERIN (NITROSTAT) 0.4 MG SL tablet Place 0.4 mg under the tongue every 5 (five) minutes as needed for chest pain (Call 911 at 3rd dose within 15 minutes.).    [provider]  ondansetron (ZOFRAN-ODT) 8 MG disintegrating tablet Take 1 tablet (8 mg total) by mouth every 8 (eight) hours as needed for nausea or vomiting. 02/23/22   Dorie Rank, MD  Peppermint Oil (IBGARD) 90 MG CPCR Take 1 capsule before meals Patient taking differently: Take 1 capsule by mouth 2 (two) times daily before a meal. 06/28/21   Irene Shipper, MD  Polyethyl Glycol-Propyl Glycol (LUBRICANT EYE DROPS) 0.4-0.3 % SOLN Place 1-2 drops into both eyes 3 (three) times daily as needed (dry/irritated eyes.).    [provider]  polyethylene glycol (MIRALAX / GLYCOLAX) 17 g packet Take 17 g by mouth daily.    [provider]  saccharomyces boulardii (FLORASTOR) 250 MG capsule Take 1 capsule (250 mg total) by mouth 2 (two) times daily. Patient taking differently: Take 250 mg by mouth daily. 08/30/20   Oswald Hillock,  MD  sodium chloride (OCEAN) 0.65 % SOLN nasal spray Place 1 spray into both nostrils at bedtime as needed for  congestion.    [provider]  spironolactone (ALDACTONE) 25 MG tablet Take 0.5 tablets (12.5 mg total) by mouth daily. Patient taking differently: Take 12.5 mg by mouth every Monday, Wednesday, and Friday. 07/04/21   Belva Crome, MD    Physical Exam: Constitutional: Moderately built and nourished. Vitals:   04/24/22 2100 04/24/22 2215 04/24/22 2245 04/24/22 2352  BP: 92/82 105/88 112/79   Pulse: 60     Resp: 16     Temp:    98 F (36.7 C)  TempSrc:      SpO2: 100%      Eyes: Anicteric no pallor. ENMT: No discharge from the ears eyes nose and mouth. Neck: No mass felt.  No neck rigidity. Respiratory: No rhonchi or crepitations. Cardiovascular: S1-S2 heard. Abdomen: Soft nontender bowel sound present. Musculoskeletal: No hematoma in the right thigh. Skin: Ecchymotic area in the right thigh and lower extremities. Neurologic: Alert awake oriented to time place and person.  Moves all extremities. Psychiatric: Appears normal.  Normal affect.   Labs on Admission: I have personally reviewed following labs and imaging studies  CBC: Recent Labs  Lab 04/24/22 1940 04/24/22 2010  WBC 7.7  --   NEUTROABS 4.9  --   HGB 9.4* 8.8*  HCT 29.6* 26.0*  MCV 103.1*  --   PLT 210  --    Basic Metabolic Panel: Recent Labs  Lab 04/24/22 1940 04/24/22 2010  NA 136 136  K 4.2 4.2  CL 110 108  CO2 18*  --   GLUCOSE 106* 98  BUN 24* 26*  CREATININE 1.22* 1.20*  CALCIUM 8.2*  --    GFR: Estimated Creatinine Clearance: 28.3 mL/min (A) (by C-G formula based on SCr of 1.2 mg/dL (H)). Liver Function Tests: Recent Labs  Lab 04/24/22 1940  AST 19  ALT 9  ALKPHOS 47  BILITOT 0.6  PROT 5.3*  ALBUMIN 3.0*   No results for input(s): "LIPASE", "AMYLASE" in the last 168 hours. No results for input(s): "AMMONIA" in the last 168 hours. Coagulation Profile: No  results for input(s): "INR", "PROTIME" in the last 168 hours. Cardiac Enzymes: No results for input(s): "CKTOTAL", "CKMB", "CKMBINDEX", "TROPONINI" in the last 168 hours. BNP (last 3 results) Recent Labs    07/05/21 1218  PROBNP 1,355*   HbA1C: No results for input(s): "HGBA1C" in the last 72 hours. CBG: No results for input(s): "GLUCAP" in the last 168 hours. Lipid Profile: No results for input(s): "CHOL", "HDL", "LDLCALC", "TRIG", "CHOLHDL", "LDLDIRECT" in the last 72 hours. Thyroid Function Tests: No results for input(s): "TSH", "T4TOTAL", "FREET4", "T3FREE", "THYROIDAB" in the last 72 hours. Anemia Panel: No results for input(s): "VITAMINB12", "FOLATE", "FERRITIN", "TIBC", "IRON", "RETICCTPCT" in the last 72 hours. Urine analysis:    Component Value Date/Time   COLORURINE YELLOW 11/16/2021 1246   APPEARANCEUR CLEAR 11/16/2021 1246   LABSPEC 1.018 11/16/2021 1246   PHURINE 5.5 11/16/2021 1246   GLUCOSEU 3+ (A) 11/16/2021 1246   GLUCOSEU NEGATIVE 06/21/2021 1640   HGBUR 2+ (A) 11/16/2021 1246   BILIRUBINUR NEGATIVE 06/21/2021 1640   KETONESUR NEGATIVE 11/16/2021 1246   PROTEINUR NEGATIVE 11/16/2021 1246   UROBILINOGEN 0.2 06/21/2021 1640   NITRITE NEGATIVE 06/21/2021 1640   LEUKOCYTESUR NEGATIVE 06/21/2021 1640   Sepsis Labs: '@LABRCNTIP'$ (procalcitonin:4,lacticidven:4) )No results found for this or any previous visit (from the past 240 hour(s)).   Radiological Exams on Admission: CT ANGIO HEAD NECK W WO CM  Result Date: 04/24/2022 CLINICAL DATA:  Chronic falls; weakness and dizziness  EXAM: CT ANGIOGRAPHY HEAD AND NECK TECHNIQUE: Multidetector CT imaging of the head and neck was performed using the standard protocol during bolus administration of intravenous contrast. Multiplanar CT image reconstructions and MIPs were obtained to evaluate the vascular anatomy. Carotid stenosis measurements (when applicable) are obtained utilizing NASCET criteria, using the distal internal  carotid diameter as the denominator. RADIATION DOSE REDUCTION: This exam was performed according to the departmental dose-optimization program which includes automated exposure control, adjustment of the mA and/or kV according to patient size and/or use of iterative reconstruction technique. CONTRAST:  60m OMNIPAQUE IOHEXOL 350 MG/ML SOLN COMPARISON:  11/19/2021 CT head no prior CTA FINDINGS: CT HEAD FINDINGS Brain: No evidence of acute infarct, hemorrhage, mass, mass effect, or midline shift. No hydrocephalus or extra-axial fluid collection. 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 Vascular: No hyperdense vessel. Skull: Normal. Negative for fracture or focal lesion. Sinuses/Orbits: Mild mucosal thickening in the anterior ethmoid air cells and left frontal sinus Other: The mastoid air cells are well aerated. CTA NECK FINDINGS Aortic arch: Standard branching. Imaged portion shows no evidence of aneurysm or dissection. No hemodynamically significant stenosis of the major arch vessel origins. Approximate 30% luminal narrowing of the left subclavian artery (series 14, image 113). Right carotid system: No evidence of dissection, occlusion, or hemodynamically significant stenosis (greater than 50%). In the distal right internal carotid artery, there is an area of mild dilatation, measuring up to 6 mm (series 12, image 143), compared to 3 mm in the more proximal ICA, returning closer to the normal caliber by the petrous segment. Left carotid system: No evidence of dissection, occlusion, or hemodynamically significant stenosis (greater than 50%). Vertebral arteries: No evidence of dissection, occlusion, or hemodynamically significant stenosis (greater than 50%). Skeleton: Degenerative changes in the cervical spine, with reversal of the normal cervical lordosis and trace anterolisthesis of C3 on C4. Trace retrolisthesis of C5 on C6. No acute osseous abnormality. Other  neck: Negative. Upper chest: No focal pulmonary opacity or pleural effusion. Review of the MIP images confirms the above findings CTA HEAD FINDINGS Anterior circulation: Both internal carotid arteries are patent to the termini, with mild-to-moderate stenosis in the left supraclinoid ICA. A1 segments patent. Normal anterior communicating artery. Anterior cerebral arteries are patent to their distal aspects. No M1 stenosis or occlusion. MCA branches perfused and symmetric. Posterior circulation: Vertebral arteries patent to the vertebrobasilar junction without stenosis. Mild dilatation of the mid left V4, which measures 3 mm in the proximal left V4, enlarges to up to 4 mm, and then narrows to 2 mm just proximal to the vertebrobasilar junction. Basilar patent to its distal aspect. Superior cerebellar arteries patent proximally. Patent P1 segments. PCAs perfused to their distal aspects without stenosis. The bilateral posterior communicating arteries are not visualized. Venous sinuses: As permitted by contrast timing, patent. Anatomic variants: None significant. Review of the MIP images confirms the above findings IMPRESSION: 1. No acute intracranial process. 2. No intracranial large vessel occlusion. Mild-to-moderate stenosis in the left supraclinoid ICA. 3. No hemodynamically significant stenosis in the neck. 4. 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 Electronically Signed   By: AMerilyn BabaM.D.   On: 04/24/2022 22:46   CT C-SPINE NO CHARGE  Result Date: 04/24/2022 CLINICAL DATA:  Fall. EXAM: CT CERVICAL SPINE WITHOUT CONTRAST TECHNIQUE: Multidetector CT imaging of the cervical spine was performed without intravenous contrast. Multiplanar CT image  reconstructions were also generated. RADIATION DOSE REDUCTION: This exam was performed according to the departmental dose-optimization program which includes automated exposure control,  adjustment of the mA and/or kV according to patient size and/or use of iterative reconstruction technique. COMPARISON:  Cervical spine CT 08/25/2019 FINDINGS: Alignment: There is 2 mm of anterolisthesis at C3-C4 which is unchanged and favored is degenerative. Alignment is otherwise anatomic. Skull base and vertebrae: No acute fracture. No primary bone lesion or focal pathologic process. Bones are osteopenic. Soft tissues and spinal canal: No prevertebral fluid or swelling. No visible canal hematoma. Disc levels: Disc space narrowing and endplate osteophyte formation is seen throughout the cervical spine from C4 through T1 similar to the prior study. There is no severe central canal or neural foraminal stenosis at any level. Upper chest: Negative. Other: None. IMPRESSION: No acute fracture or traumatic subluxation of the cervical spine. Electronically Signed   By: Ronney Asters M.D.   On: 04/24/2022 21:45    EKG: Independently reviewed.  Atrial flutter rate controlled.  Assessment/Plan Principal Problem:   Weakness Active Problems:   Permanent atrial fibrillation (HCC)   Anemia   Essential hypertension   Dizziness   Thigh hematoma   Weakness generalized    Generalized weakness likely from volume loss from blood loss patient is orthostatic.  Since patient is having decreasing blood levels in the ER we will go ahead and transfuse 2 units of PRBC.  Patient is in agreement on holding Eliquis.  Recheck hemoglobin after transfusion recheck orthostatics.  We will hold antihypertensives. Hematoma of the right thigh after fall.  Holding Eliquis.  No signs of any compartment syndrome at this time.  We will continue to closely monitor. History of A-fib on Eliquis and beta-blockers.  Holding Eliquis due to large hematoma and decreasing trend of hemoglobin.  Patient in agreement holding.  Holding beta-blockers due to low blood pressure. History of hypertension takes losartan and beta-blockers.  Holding both due  to low blood pressure and patient being orthostatic.  We will keep patient.  IV hydralazine. Chronic kidney disease stage III creatinine appears to be at baseline. History of iron deficiency anemia hemoglobin has dropped significantly with symptoms receiving PRBC. History of CAD denies any chest pain.  Troponins pending. History of factor VIII deficiency. Frequent falls we will get physical therapy consult after volume correction.   DVT prophylaxis: SCDs.  Avoiding anticoagulation since patient is having decreasing trend of hemoglobin with large hematoma in the right thigh. Code Status: Full code. Family Communication: Husband. Disposition Plan: To be determined. Consults called: Physical therapy. Admission status: Observation.   Rise Patience MD [facility] Pager [phone].  If 7PM-7AM, please contact night-coverage [email or web address] Password [facility]  04/25/2022, 12:22 AM

## 2022-04-25 NOTE — Evaluation (Addendum)
Physical Therapy Evaluation Patient Details Name: Hailey [name] MRN: [id number] DOB: 1935-07-04 Today's Date: 04/25/2022  History of Present Illness  Hailey [name] is a 87 y.o. female admitted 8/15 and was brought to the ER after patient was found to be very weak.  Over the last 10 days patient had at least 3 falls.  Last one was 8/14.  As per the husband patient tripped on his foot and fell onto the floor but did not hit her head or lose consciousness.  Pt with right thigh hematoma and  anemia and receiving transfusion.  PMH:  CAD status post stenting, permanent atrial fibrillation, chronic kidney disease stage III, iron deficiency anemia and history of factor XI deficiency  Clinical Impression  Pt admitted with above diagnosis. Pt was able to stand to RW with min guard assist however pt was orthostatic and felt "weak" therefore laid her back onto stretcher with assist.   Will follow acutely.  Pt was educated to use RW at all times when d/c'd for safety.   Pt currently with functional limitations due to the deficits listed below (see PT Problem List). Pt will benefit from skilled PT to increase their independence and safety with mobility to allow discharge to the venue listed below.     Orthostatic BPs  Supine 97/74, 100 bpm  Sitting 107/70, 130 bpm  Standing 88/69, 103 bpm  Standing after 3 min 76/56, 102 bpm         Recommendations for follow up therapy are one component of a multi-disciplinary discharge planning process, led by the attending physician.  Recommendations may be updated based on patient status, additional functional criteria and insurance authorization.  Follow Up Recommendations Outpatient PT      Assistance Recommended at Discharge Frequent or constant Supervision/Assistance  Patient can return home with the following  A little help with walking and/or transfers;A little help with bathing/dressing/bathroom;Assist for transportation;Help with stairs or ramp for  entrance;Assistance with cooking/housework    Equipment Recommendations None recommended by PT  Recommendations for Other Services       Functional Status Assessment Patient has had a recent decline in their functional status and demonstrates the ability to make significant improvements in function in a reasonable and predictable amount of time.     Precautions / Restrictions Precautions Precautions: Fall Restrictions Weight Bearing Restrictions: No      Mobility  Bed Mobility Overal bed mobility: Needs Assistance Bed Mobility: Supine to Sit     Supine to sit: Min assist     General bed mobility comments: incr time to come to edge of stretcher.  A little assist with pt pulling on PTs hand    Transfers Overall transfer level: Needs assistance Equipment used: Rolling walker (2 wheels) Transfers: Sit to/from Stand Sit to Stand: Min guard           General transfer comment: cues for hand placement but no physical assist to stand to RW. Pt asked for something to support herself with to stand.  Pt vitals were orthostatic and pt feeling "weak" after standing for 3 min therefore got pt back to bed.    Ambulation/Gait                  Stairs            Wheelchair Mobility    Modified Rankin (Stroke Patients Only)       Balance Overall balance assessment: Needs assistance Sitting-balance support: No upper extremity supported, Feet supported Sitting  balance-Leahy Scale: Fair     Standing balance support: Bilateral upper extremity supported, During functional activity, Reliant on assistive device for balance Standing balance-Leahy Scale: Poor Standing balance comment: relies on UE support                             Pertinent Vitals/Pain Pain Assessment Pain Assessment: Faces Faces Pain Scale: Hurts even more Pain Location: right LE Pain Descriptors / Indicators: Discomfort Pain Intervention(s): Limited activity within patient's  tolerance, Monitored during session, Repositioned, Patient requesting pain meds-RN notified    Home Living Family/patient expects to be discharged to:: Private residence Living Arrangements: Spouse/significant other Available Help at Discharge: Family;Available 24 hours/day Type of Home: House Home Access: Stairs to enter Entrance Stairs-Rails: Left Entrance Stairs-Number of Steps: 2 Alternate Level Stairs-Number of Steps: 2 (to living room) Home Layout: Two level;Able to live on main level with bedroom/bathroom Home Equipment: Rolling Walker (2 wheels);Cane - single point;BSC/3in1;Shower seat;Grab bars - tub/shower;Hand held shower head;Wheelchair - manual Additional Comments: [place] just [place] to this admit and able to get in water etc.    Prior Function Prior Level of Function : Driving;Independent/Modified Independent                     Hand Dominance   Dominant Hand: Right    Extremity/Trunk Assessment   Upper Extremity Assessment Upper Extremity Assessment: Defer to OT evaluation    Lower Extremity Assessment Lower Extremity Assessment: Generalized weakness    Cervical / Trunk Assessment Cervical / Trunk Assessment: Normal  Communication   Communication: No difficulties  Cognition Arousal/Alertness: Awake/alert Behavior During Therapy: WFL for tasks assessed/performed Overall Cognitive Status: Within Functional Limits for tasks assessed                                          General Comments      Exercises General Exercises - Lower Extremity Long Arc Quad: AROM, Both, 10 reps, Seated   Assessment/Plan    PT Assessment Patient needs continued PT services  PT Problem List Decreased activity tolerance;Decreased balance;Decreased mobility;Decreased knowledge of use of DME;Decreased safety awareness;Decreased knowledge of precautions;Pain       PT Treatment Interventions DME instruction;Functional mobility  training;Therapeutic activities;Therapeutic exercise;Balance training;Patient/family education;Stair training;Gait training    PT Goals (Current goals can be found in the Care Plan section)  Acute Rehab PT Goals Patient Stated Goal: to go home PT Goal Formulation: With patient Time For Goal Achievement: 05/09/22 Potential to Achieve Goals: Good    Frequency Min 3X/week     Co-evaluation               AM-PAC PT "6 Clicks" Mobility  Outcome Measure Help needed turning from your back to your side while in a flat bed without using bedrails?: A Little Help needed moving from lying on your back to sitting on the side of a flat bed without using bedrails?: A Little Help needed moving to and from a bed to a chair (including a wheelchair)?: A Little Help needed standing up from a chair using your arms (e.g., wheelchair or bedside chair)?: A Little Help needed to walk in hospital room?: A Lot Help needed climbing 3-5 steps with a railing? : A Lot 6 Click Score: 16    End of Session Equipment Utilized  During Treatment: Gait belt Activity Tolerance: Patient limited by fatigue;Patient limited by pain Patient left: with call bell/phone within reach (on stretcher) Nurse Communication: Mobility status PT Visit Diagnosis: Pain;Muscle weakness (generalized) (M62.81);Other abnormalities of gait and mobility (R26.89) Pain - Right/Left: Right Pain - part of body: Leg    Time: 2694-8546 PT Time Calculation (min) (ACUTE ONLY): 25 min   Charges:   PT Evaluation $PT Eval Moderate Complexity: 1 Mod PT Treatments $Therapeutic Activity: 8-22 mins        [facility] M,PT Acute Rehab Services [phone]   Alvira Philips 04/25/2022, 11:11 AM

## 2022-04-25 NOTE — Progress Notes (Signed)
Patient arrived onto the unit. RN noted generalized abrasions throughout due to fall. Foams applied on the abrasions. Patient has a right hip hematoma. RN measurements of 15x11 with Rebbeca Paul and Melva RN assessments. RN marked R Hip hematoma with skin marker with patient permission.

## 2022-04-25 NOTE — Assessment & Plan Note (Addendum)
-   Home regimen resumed at discharge 

## 2022-04-25 NOTE — ED Notes (Signed)
Patient transported to MRI 

## 2022-04-25 NOTE — Assessment & Plan Note (Addendum)
-   Orthostatic on admission - Repeat orthostatic blood pressure 8/17 is negative

## 2022-04-25 NOTE — Assessment & Plan Note (Signed)
-  patient has history of CKD3b. Baseline creat ~ 1.1-1.2, eGFR 40

## 2022-04-25 NOTE — Hospital Course (Addendum)
Hailey [name] is an 87 y.o. female with history of CAD status post stenting, permanent atrial fibrillation, CKD3b, iron deficiency anemia and history of factor XI deficiency who was brought to the ER after patient was found to be very weak.   She appears to have a history of easily falling and has had more falls over the past approximate week prior to admission.  Her falls appear mechanical in nature and she denies any presyncopal symptoms prior to.  Patient had tripped over her husband's foot prior to admission and landed on her right side. She began developing swelling in her right hip and due to her lethargy she was brought to the ER and underwent some work-up for possible stroke. MRI brain was negative for acute intracranial processes.  CT angio head/neck was also negative for acute findings.  CT right femur was performed and showed hyperdense collection in the lateral right hip measuring 15 x 7.1 x 8 cm concerning for hematoma.  Eliquis was held on admission and she was admitted for further monitoring of her hip. She was found to be orthostatic as well.  Hemoglobin also down trended to 7.1 and she was ordered 2 units PRBC. Hemoglobin remained stable after transfusion and Eliquis was held during hospitalization.  Bruising and pain improved in her right thigh. Due to ongoing risk for withholding Eliquis, she was instructed to resume Eliquis at discharge on 04/28/2022 and monitor for any further worsening of bruising or pain in her hip.  Bruising was outlined with skin marker during hospitalization.  She also was instructed to monitor for any signs of dizziness/lightheadedness or sustained increased heart rate.  She will also obtain hemoglobin check within 1 week after discharge to ensure stability.

## 2022-04-25 NOTE — ED Notes (Signed)
Patient transported to CT 

## 2022-04-25 NOTE — Assessment & Plan Note (Addendum)
-   s/p mechanical fall with large hematoma on CT (15 x 7.1 x 8 cm) -Bruising improved with monitoring during hospitalization.  Outlined with skin marker with recession of bruising appreciated -Compartment remained soft and pain slowly improved - Eliquis okay to be resumed on 04/28/2022; patient educated on what to look out for after resuming Eliquis

## 2022-04-25 NOTE — Assessment & Plan Note (Addendum)
-   Presumed bleeding into right thigh from traumatic fall - Eliquis held 8/16 - 8/18; okay to resume on 8/19 with instructions to monitor for worsening of bruising or pain/swelling in RLE - Hgb stable at 8.3 g/dL at discharge - repeat Hgb at follow up within 1 week

## 2022-04-25 NOTE — ED Notes (Signed)
Orthostatic vitals and symptoms reported to Dr Hal Hope at this time.

## 2022-04-25 NOTE — Assessment & Plan Note (Addendum)
-   likely due to anemia and significant Hgb drop - continue with PT while in hospital -Patient to resume outpatient therapy at discharge

## 2022-04-25 NOTE — Assessment & Plan Note (Addendum)
-   Continue Eliquis and Toprol at discharge

## 2022-04-25 NOTE — ED Notes (Signed)
Purewick placed at this time by NT

## 2022-04-25 NOTE — Progress Notes (Signed)
Progress Note    Hailey [name]   LID:[id number]  DOB: 1934-11-08  DOA: 04/24/2022     0 PCP: Hailey Anger, MD  Initial CC: fall  Hospital Course: Hailey [name] is an 87 y.o. female with history of CAD status post stenting, permanent atrial fibrillation, chronic kidney disease stage III, iron deficiency anemia and history of factor XI deficiency who was brought to the ER after patient was found to be very weak.   She appears to have a history of easily falling and has had more falls over the past approximate week prior to admission.  Her falls appear mechanical in nature and she denies any presyncopal symptoms prior to.  Patient had tripped over her husband's foot prior to admission and landed on her right side. She began developing swelling in her right hip and due to her lethargy she was brought to the ER and underwent some work-up for possible stroke. MRI brain was negative for acute intracranial processes.  CT angio head/neck was also negative for acute findings.  CT right femur was performed and showed hyperdense collection in the lateral right hip measuring 15 x 7.1 x 8 cm concerning for hematoma.  Eliquis was held on admission and she was admitted for further monitoring of her hip. She was found to be orthostatic as well.  Hemoglobin also down trended to 7.1 and she was ordered 2 units PRBC.  Interval History:  Seen in the ER this morning and again in the afternoon.  She began having some mild increase in pain in her right hip although no change in the amount of swelling and no signs of compartment syndrome when seen in the afternoon.  Husband was also present bedside when seen again.  Assessment and Plan: ABLA (acute blood loss anemia) - Presumed bleeding into right thigh from traumatic fall - Eliquis on hold - Blood pressure improving this morning after PRBC transfusions -Hemoglobin nadir 7.1 g/dL so far.  2 units PRBC given - Follow-up hemoglobin this afternoon and  continue trending  Thigh hematoma - s/p mechanical fall with large hematoma on CT (15 x 7.1 x 8 cm) - Continue holding Eliquis - Continue neurovascular checks and monitoring for signs of compartment syndrome  Orthostatic hypotension - still orthostatic on BP check this am with PT - continue oral intake - will transfuse still if needed - will start on NS for now as well - repeat orthostatics daily   Permanent atrial fibrillation (HCC) - Eliquis and Toprol on hold for now  Weakness generalized - likely due to anemia and significant Hgb drop - continue with PT while in hospital and will support hemodynamics as needed   Chronic kidney disease, stage 3b ([place]) - patient has history of CKD3b. Baseline creat ~ 1.1-1.2, eGFR 40  Essential hypertension - Home BP meds on hold for now given hypotension   Old records reviewed in assessment of this patient  Antimicrobials:   DVT prophylaxis:  SCDs Start: 04/25/22 0021   Code Status:   Code Status: Full Code  Mobility Assessment (last 72 hours)     Mobility Assessment     Row Name 04/25/22 1100           What is the highest level of mobility based on the progressive mobility assessment? Level 3 (Stands with assist) - Balance while standing  and cannot march in place                Barriers to discharge:  Disposition  Plan: Home in 1 to 2 days Status is: Inpt  Objective: Blood pressure 120/77, pulse 85, temperature 98.5 F (36.9 C), temperature source Oral, resp. rate 16, SpO2 98 %.  Examination:  Physical Exam Constitutional:      General: She is not in acute distress.    Appearance: Normal appearance.  HENT:     Head: Normocephalic and atraumatic.     Mouth/Throat:     Mouth: Mucous membranes are moist.  Eyes:     Extraocular Movements: Extraocular movements intact.  Cardiovascular:     Rate and Rhythm: Normal rate and regular rhythm.  Pulmonary:     Effort: Pulmonary effort is normal.     Breath sounds:  Normal breath sounds.  Abdominal:     General: Bowel sounds are normal. There is no distension.     Palpations: Abdomen is soft.     Tenderness: There is no abdominal tenderness.  Musculoskeletal:     Cervical back: Normal range of motion.     Comments: R upper thigh lateral noted with ~5cm area of bruising and mild firmness with minimal TTP; edema extends to lower thigh, none in leg  Skin:    General: Skin is warm and dry.     Findings: Bruising present.  Neurological:     Mental Status: She is alert.     Comments: No paresthesias; sensation intact and equal in B/L LE. Right DP noted and 2+  Psychiatric:        Mood and Affect: Mood normal.        Behavior: Behavior normal.      Consultants:    Procedures:    Data Reviewed: Results for orders placed or performed during the hospital encounter of 04/24/22 (from the past 24 hour(s))  CBC with Differential     Status: Abnormal   Collection Time: 04/24/22  7:40 PM  Result Value Ref Range   WBC 7.7 4.0 - 10.5 K/uL   RBC 2.87 (L) 3.87 - 5.11 MIL/uL   Hemoglobin 9.4 (L) 12.0 - 15.0 g/dL   HCT 29.6 (L) 36.0 - 46.0 %   MCV 103.1 (H) 80.0 - 100.0 fL   MCH 32.8 26.0 - 34.0 pg   MCHC 31.8 30.0 - 36.0 g/dL   RDW 14.5 11.5 - 15.5 %   Platelets 210 150 - 400 K/uL   nRBC 0.0 0.0 - 0.2 %   Neutrophils Relative % 64 %   Neutro Abs 4.9 1.7 - 7.7 K/uL   Lymphocytes Relative 18 %   Lymphs Abs 1.4 0.7 - 4.0 K/uL   Monocytes Relative 10 %   Monocytes Absolute 0.8 0.1 - 1.0 K/uL   Eosinophils Relative 7 %   Eosinophils Absolute 0.5 0.0 - 0.5 K/uL   Basophils Relative 0 %   Basophils Absolute 0.0 0.0 - 0.1 K/uL   Immature Granulocytes 1 %   Abs Immature Granulocytes 0.06 0.00 - 0.07 K/uL  Comprehensive metabolic panel     Status: Abnormal   Collection Time: 04/24/22  7:40 PM  Result Value Ref Range   Sodium 136 135 - 145 mmol/L   Potassium 4.2 3.5 - 5.1 mmol/L   Chloride 110 98 - 111 mmol/L   CO2 18 (L) 22 - 32 mmol/L   Glucose,  Bld 106 (H) 70 - 99 mg/dL   BUN 24 (H) 8 - 23 mg/dL   Creatinine, Ser 1.22 (H) 0.44 - 1.00 mg/dL   Calcium 8.2 (L) 8.9 - 10.3 mg/dL   Total  Protein 5.3 (L) 6.5 - 8.1 g/dL   Albumin 3.0 (L) 3.5 - 5.0 g/dL   AST 19 15 - 41 U/L   ALT 9 0 - 44 U/L   Alkaline Phosphatase 47 38 - 126 U/L   Total Bilirubin 0.6 0.3 - 1.2 mg/dL   GFR, Estimated 43 (L) >60 mL/min   Anion gap 8 5 - 15  Ethanol     Status: None   Collection Time: 04/24/22  7:40 PM  Result Value Ref Range   Alcohol, Ethyl (B) <10 <10 mg/dL  I-stat chem 8, ED (not at [facility] or [facility])     Status: Abnormal   Collection Time: 04/24/22  8:10 PM  Result Value Ref Range   Sodium 136 135 - 145 mmol/L   Potassium 4.2 3.5 - 5.1 mmol/L   Chloride 108 98 - 111 mmol/L   BUN 26 (H) 8 - 23 mg/dL   Creatinine, Ser 1.20 (H) 0.44 - 1.00 mg/dL   Glucose, Bld 98 70 - 99 mg/dL   Calcium, Ion 1.12 (L) 1.15 - 1.40 mmol/L   TCO2 19 (L) 22 - 32 mmol/L   Hemoglobin 8.8 (L) 12.0 - 15.0 g/dL   HCT 26.0 (L) 36.0 - 46.0 %  CBC     Status: Abnormal   Collection Time: 04/25/22  1:06 AM  Result Value Ref Range   WBC 6.4 4.0 - 10.5 K/uL   RBC 2.18 (L) 3.87 - 5.11 MIL/uL   Hemoglobin 7.1 (L) 12.0 - 15.0 g/dL   HCT 22.0 (L) 36.0 - 46.0 %   MCV 100.9 (H) 80.0 - 100.0 fL   MCH 32.6 26.0 - 34.0 pg   MCHC 32.3 30.0 - 36.0 g/dL   RDW 14.5 11.5 - 15.5 %   Platelets 183 150 - 400 K/uL   nRBC 0.0 0.0 - 0.2 %  Prepare RBC (crossmatch)     Status: None   Collection Time: 04/25/22  3:04 AM  Result Value Ref Range   Order Confirmation      ORDER PROCESSED BY BLOOD BANK Performed at [facility], [address] [place], [place] [address]   Type and screen [place]     Status: None (Preliminary result)   Collection Time: 04/25/22  3:21 AM  Result Value Ref Range   ABO/RH(D) A POS    Antibody Screen NEG    Sample Expiration 04/28/2022,2359    Unit Number [id number]    Blood Component Type RED CELLS,LR    Unit division 00     Status of Unit ISSUED    Transfusion Status OK TO TRANSFUSE    Crossmatch Result Compatible    Unit Number [id number]    Blood Component Type RED CELLS,LR    Unit division 00    Status of Unit ISSUED    Transfusion Status OK TO TRANSFUSE    Crossmatch Result      Compatible Performed at [facility], [place] [address] [place], [place] [address]   Troponin I (High Sensitivity)     Status: None   Collection Time: 04/25/22  4:32 AM  Result Value Ref Range   Troponin I (High Sensitivity) 7 <18 ng/L   *Note: Due to a large number of results and/or encounters for the requested time period, some results have not been displayed. A complete set of results can be found in Results Review.    I have Reviewed nursing notes, Vitals, and Lab results since pt's last encounter.  Pertinent lab results : see above I have ordered test including BMP, CBC, Mg I have reviewed the last note from staff over past 24 hours I have discussed pt's care plan and test results with nursing staff, case manager   LOS: 0 days   Dwyane Dee, MD [facility] 04/25/2022, 2:34 PM

## 2022-04-26 DIAGNOSIS — S7011XA Contusion of right thigh, initial encounter: Secondary | ICD-10-CM | POA: Diagnosis not present

## 2022-04-26 DIAGNOSIS — I951 Orthostatic hypotension: Secondary | ICD-10-CM | POA: Diagnosis not present

## 2022-04-26 DIAGNOSIS — D62 Acute posthemorrhagic anemia: Secondary | ICD-10-CM | POA: Diagnosis not present

## 2022-04-26 LAB — HEMOGLOBIN AND HEMATOCRIT, BLOOD
HCT: 26.9 % — ABNORMAL LOW (ref 36.0–46.0)
HCT: 27 % — ABNORMAL LOW (ref 36.0–46.0)
HCT: 25.9 % — ABNORMAL LOW (ref 36.0–46.0)
Hemoglobin: 8.9 g/dL — ABNORMAL LOW (ref 12.0–15.0)
Hemoglobin: 8.9 g/dL — ABNORMAL LOW (ref 12.0–15.0)
Hemoglobin: 8.6 g/dL — ABNORMAL LOW (ref 12.0–15.0)

## 2022-04-26 LAB — BPAM RBC
Blood Product Expiration Date: 202308222359
Blood Product Expiration Date: 202308232359
ISSUE DATE / TIME: 202308160419
ISSUE DATE / TIME: 202308160652
Unit Type and Rh: 6200
Unit Type and Rh: 6200

## 2022-04-26 LAB — BASIC METABOLIC PANEL
Anion gap: 5 (ref 5–15)
BUN: 20 mg/dL (ref 8–23)
CO2: 21 mmol/L — ABNORMAL LOW (ref 22–32)
Calcium: 8.2 mg/dL — ABNORMAL LOW (ref 8.9–10.3)
Chloride: 112 mmol/L — ABNORMAL HIGH (ref 98–111)
Creatinine, Ser: 1.08 mg/dL — ABNORMAL HIGH (ref 0.44–1.00)
GFR, Estimated: 50 mL/min — ABNORMAL LOW (ref >60)
Glucose, Bld: 100 mg/dL — ABNORMAL HIGH (ref 70–99)
Potassium: 3.6 mmol/L (ref 3.5–5.1)
Sodium: 138 mmol/L (ref 135–145)

## 2022-04-26 LAB — TYPE AND SCREEN
ABO/RH(D): A POS
Antibody Screen: NEGATIVE
Unit division: 0
Unit division: 0

## 2022-04-26 LAB — MAGNESIUM: Magnesium: 2.1 mg/dL (ref 1.7–2.4)

## 2022-04-26 MED ORDER — PANTOPRAZOLE SODIUM 40 MG PO TBEC
80.0000 mg | DELAYED_RELEASE_TABLET | Freq: Every day | ORAL | Status: DC
  Administered 2022-04-26 – 2022-04-27 (×2): 80 mg via ORAL
  Filled 2022-04-26 (×2): qty 2

## 2022-04-26 MED ORDER — ONDANSETRON HCL 4 MG/2ML IJ SOLN
4.0000 mg | Freq: Four times a day (QID) | INTRAMUSCULAR | Status: DC | PRN
Start: 2022-04-26 — End: 2022-04-27
  Administered 2022-04-26: 4 mg via INTRAVENOUS
  Filled 2022-04-26: qty 2

## 2022-04-26 MED ORDER — DOXYCYCLINE HYCLATE 100 MG PO TABS
100.0000 mg | ORAL_TABLET | Freq: Two times a day (BID) | ORAL | Status: DC
  Administered 2022-04-26 – 2022-04-27 (×3): 100 mg via ORAL
  Filled 2022-04-26 (×3): qty 1

## 2022-04-26 MED ORDER — SALINE SPRAY 0.65 % NA SOLN: 1.0000 | NASAL | Status: DC | PRN

## 2022-04-26 MED ORDER — LORAZEPAM 0.5 MG PO TABS
0.5000 mg | ORAL_TABLET | Freq: Two times a day (BID) | ORAL | Status: DC | PRN
  Administered 2022-04-27: 0.5 mg via ORAL
  Filled 2022-04-26: qty 1

## 2022-04-26 NOTE — Progress Notes (Signed)
Orthostatic BPs BPs  HR  Supine 128/73 76  Sitting 123/85 74  Standing 120/78 77  Standing after 3 min 118/78 92     Herb Beltre Building services engineer

## 2022-04-26 NOTE — Progress Notes (Signed)
Mobility Specialist - Progress Note    04/26/22 1220  Mobility  Activity Ambulated with assistance in hallway  Level of Assistance Contact guard assist, steadying assist  Assistive Device Front wheel walker  Distance Ambulated (ft) 80 ft  Activity Response Tolerated well  $Mobility charge 1 Mobility   Pt was received in bed and agreeable to mobility. No c/o pain throughout ambulation. Pt was returned to chair with all needs met.   Larey Seat

## 2022-04-26 NOTE — Progress Notes (Signed)
Progress Note    Hailey [name]   JYN:[id number]  DOB: Apr 28, 1935  DOA: 04/24/2022     1 PCP: Cassandria Anger, MD  Initial CC: fall  Hospital Course: Hailey [name] is an 87 y.o. female with history of CAD status post stenting, permanent atrial fibrillation, CKD3b, iron deficiency anemia and history of factor XI deficiency who was brought to the ER after patient was found to be very weak.   She appears to have a history of easily falling and has had more falls over the past approximate week prior to admission.  Her falls appear mechanical in nature and she denies any presyncopal symptoms prior to.  Patient had tripped over her husband's foot prior to admission and landed on her right side. She began developing swelling in her right hip and due to her lethargy she was brought to the ER and underwent some work-up for possible stroke. MRI brain was negative for acute intracranial processes.  CT angio head/neck was also negative for acute findings.  CT right femur was performed and showed hyperdense collection in the lateral right hip measuring 15 x 7.1 x 8 cm concerning for hematoma.  Eliquis was held on admission and she was admitted for further monitoring of her hip. She was found to be orthostatic as well.  Hemoglobin also down trended to 7.1 and she was ordered 2 units PRBC.  Interval History:  Pain is more tolerable when seen today.  She also was able to ambulate about 80 feet with mobility specialist.  Assessment and Plan: * ABLA (acute blood loss anemia) - Presumed bleeding into right thigh from traumatic fall - Eliquis on hold - Blood pressure improving this morning after PRBC transfusions -Hemoglobin nadir 7.1 g/dL so far.  2 units PRBC given - Hgb now stable s/p transfusions - repeat CBC in am   Thigh hematoma - s/p mechanical fall with large hematoma on CT (15 x 7.1 x 8 cm) - Continue holding Eliquis - Continue neurovascular checks and monitoring for signs of  compartment syndrome -Overall seems to be improving at this time; tolerated walking well with mobility specialist today  Orthostatic hypotension-resolved as of 04/26/2022 - Orthostatic on admission - Repeat orthostatic blood pressure today is negative  Permanent atrial fibrillation (HCC) - Eliquis and Toprol on hold for now  Weakness generalized - likely due to anemia and significant Hgb drop - continue with PT while in hospital and will support hemodynamics as needed   Chronic kidney disease, stage 3b ([place]) - patient has history of CKD3b. Baseline creat ~ 1.1-1.2, eGFR 40  Essential hypertension - Home BP meds on hold for now given hypotension on admission - BP improving and stable for now   Old records reviewed in assessment of this patient  Antimicrobials:   DVT prophylaxis:  SCDs Start: 04/25/22 0021   Code Status:   Code Status: Full Code  Mobility Assessment (last 72 hours)     Mobility Assessment     Row Name 04/26/22 0830 04/25/22 1626 04/25/22 1100       Does patient have an order for bedrest or is patient medically unstable No - Continue assessment No - Continue assessment --     What is the highest level of mobility based on the progressive mobility assessment? Level 3 (Stands with assist) - Balance while standing  and cannot march in place Level 3 (Stands with assist) - Balance while standing  and cannot march in place Level 3 (Stands with assist) -  Balance while standing  and cannot march in place     Is the above level different from baseline mobility prior to current illness? Yes - Recommend PT order Yes - Recommend PT order --              Barriers to discharge:  Disposition Plan: Home Friday Status is: Inpt  Objective: Blood pressure 125/69, pulse 72, temperature 98.3 F (36.8 C), temperature source Oral, resp. rate 19, weight 65.9 kg, SpO2 96 %.  Examination:  Physical Exam Constitutional:      General: She is not in acute distress.     Appearance: Normal appearance.  HENT:     Head: Normocephalic and atraumatic.     Mouth/Throat:     Mouth: Mucous membranes are moist.  Eyes:     Extraocular Movements: Extraocular movements intact.  Cardiovascular:     Rate and Rhythm: Normal rate and regular rhythm.  Pulmonary:     Effort: Pulmonary effort is normal.     Breath sounds: Normal breath sounds.  Abdominal:     General: Bowel sounds are normal. There is no distension.     Palpations: Abdomen is soft.     Tenderness: There is no abdominal tenderness.  Musculoskeletal:     Cervical back: Normal range of motion.     Comments: R upper thigh lateral noted with ~5cm area of bruising and mild firmness with minimal TTP; edema extends to lower thigh, none in leg  Skin:    General: Skin is warm and dry.     Findings: Bruising present.  Neurological:     Mental Status: She is alert.     Comments: No paresthesias; sensation intact and equal in B/L LE. Right DP noted and 2+  Psychiatric:        Mood and Affect: Mood normal.        Behavior: Behavior normal.      Consultants:    Procedures:    Data Reviewed: Results for orders placed or performed during the hospital encounter of 04/24/22 (from the past 24 hour(s))  Hemoglobin and hematocrit, blood     Status: Abnormal   Collection Time: 04/25/22  9:24 PM  Result Value Ref Range   Hemoglobin 9.2 (L) 12.0 - 15.0 g/dL   HCT 27.4 (L) 36.0 - 31.4 %  Basic metabolic panel     Status: Abnormal   Collection Time: 04/26/22  7:02 AM  Result Value Ref Range   Sodium 138 135 - 145 mmol/L   Potassium 3.6 3.5 - 5.1 mmol/L   Chloride 112 (H) 98 - 111 mmol/L   CO2 21 (L) 22 - 32 mmol/L   Glucose, Bld 100 (H) 70 - 99 mg/dL   BUN 20 8 - 23 mg/dL   Creatinine, Ser 1.08 (H) 0.44 - 1.00 mg/dL   Calcium 8.2 (L) 8.9 - 10.3 mg/dL   GFR, Estimated 50 (L) >60 mL/min   Anion gap 5 5 - 15  Magnesium     Status: None   Collection Time: 04/26/22  7:02 AM  Result Value Ref Range    Magnesium 2.1 1.7 - 2.4 mg/dL  Hemoglobin and hematocrit, blood     Status: Abnormal   Collection Time: 04/26/22  7:02 AM  Result Value Ref Range   Hemoglobin 8.9 (L) 12.0 - 15.0 g/dL   HCT 26.9 (L) 36.0 - 46.0 %  Hemoglobin and hematocrit, blood     Status: Abnormal   Collection Time: 04/26/22  2:15 PM  Result Value Ref Range   Hemoglobin 8.9 (L) 12.0 - 15.0 g/dL   HCT 27.0 (L) 36.0 - 46.0 %   *Note: Due to a large number of results and/or encounters for the requested time period, some results have not been displayed. A complete set of results can be found in Results Review.    I have Reviewed nursing notes, Vitals, and Lab results since pt's last encounter. Pertinent lab results : see above I have ordered test including BMP, CBC, Mg I have reviewed the last note from staff over past 24 hours I have discussed pt's care plan and test results with nursing staff, case manager   LOS: 1 day   Dwyane Dee, MD [facility] 04/26/2022, 3:38 PM

## 2022-04-27 ENCOUNTER — Other Ambulatory Visit (HOSPITAL_COMMUNITY): Payer: Self-pay

## 2022-04-27 ENCOUNTER — Telehealth: Payer: Self-pay | Admitting: Interventional Cardiology

## 2022-04-27 ENCOUNTER — Encounter: Payer: Self-pay | Admitting: Hematology

## 2022-04-27 DIAGNOSIS — D62 Acute posthemorrhagic anemia: Secondary | ICD-10-CM | POA: Diagnosis not present

## 2022-04-27 DIAGNOSIS — I4821 Permanent atrial fibrillation: Secondary | ICD-10-CM

## 2022-04-27 DIAGNOSIS — I951 Orthostatic hypotension: Secondary | ICD-10-CM | POA: Diagnosis not present

## 2022-04-27 DIAGNOSIS — S7011XA Contusion of right thigh, initial encounter: Secondary | ICD-10-CM | POA: Diagnosis not present

## 2022-04-27 DIAGNOSIS — Z7901 Long term (current) use of anticoagulants: Secondary | ICD-10-CM

## 2022-04-27 LAB — HEMOGLOBIN AND HEMATOCRIT, BLOOD
HCT: 24.3 % — ABNORMAL LOW (ref 36.0–46.0)
Hemoglobin: 8.3 g/dL — ABNORMAL LOW (ref 12.0–15.0)

## 2022-04-27 LAB — BASIC METABOLIC PANEL
Anion gap: 4 — ABNORMAL LOW (ref 5–15)
BUN: 14 mg/dL (ref 8–23)
CO2: 19 mmol/L — ABNORMAL LOW (ref 22–32)
Calcium: 8.4 mg/dL — ABNORMAL LOW (ref 8.9–10.3)
Chloride: 112 mmol/L — ABNORMAL HIGH (ref 98–111)
Creatinine, Ser: 0.88 mg/dL (ref 0.44–1.00)
GFR, Estimated: >60 mL/min (ref >60)
Glucose, Bld: 105 mg/dL — ABNORMAL HIGH (ref 70–99)
Potassium: 3.9 mmol/L (ref 3.5–5.1)
Sodium: 135 mmol/L (ref 135–145)

## 2022-04-27 LAB — MAGNESIUM: Magnesium: 2 mg/dL (ref 1.7–2.4)

## 2022-04-27 MED ORDER — OXYCODONE HCL 5 MG PO TABS
2.5000 mg | ORAL_TABLET | Freq: Four times a day (QID) | ORAL | 0 refills | Status: AC | PRN
  Filled 2022-04-27: qty 6, 3d supply, fill #0

## 2022-04-27 NOTE — Discharge Summary (Signed)
Physician Discharge Summary   Hailey [name] YWV:[id number] DOB: [birth year] DOA: 04/24/2022  PCP: Cassandria Anger, MD  Admit date: 04/24/2022 Discharge date: 04/27/2022  Barriers to discharge: none  Admitted From: Home Disposition:  Home Discharging physician: Dwyane Dee, MD  Recommendations for Outpatient Follow-up:  Please try to recheck Hgb within 1 week of discharge; ideally by 8/21 or 8/22 if able If does re-bleed, may need Eliquis held longer   Patient to continue with outpatient PT at discharge   Discharge Condition: stable CODE STATUS: Full Diet recommendation:  Diet Orders (From admission, onward)     Start     Ordered   04/27/22 0000  Diet - low sodium heart healthy        04/27/22 1109   04/25/22 0022  Diet Heart Room service appropriate? Yes; Fluid consistency: Thin  Diet effective now       Question Answer Comment  Room service appropriate? Yes   Fluid consistency: Thin      04/25/22 0021            Hospital Course: MAJESTA LEICHTER is an 87 y.o. female with history of CAD status post stenting, permanent atrial fibrillation, CKD3b, iron deficiency anemia and history of factor XI deficiency who was brought to the ER after patient was found to be very weak.   She appears to have a history of easily falling and has had more falls over the past approximate week prior to admission.  Her falls appear mechanical in nature and she denies any presyncopal symptoms prior to.  Patient had tripped over her husband's foot prior to admission and landed on her right side. She began developing swelling in her right hip and due to her lethargy she was brought to the ER and underwent some work-up for possible stroke. MRI brain was negative for acute intracranial processes.  CT angio head/neck was also negative for acute findings.  CT right femur was performed and showed hyperdense collection in the lateral right hip measuring 15 x 7.1 x 8 cm concerning for hematoma.   Eliquis was held on admission and she was admitted for further monitoring of her hip. She was found to be orthostatic as well.  Hemoglobin also down trended to 7.1 and she was ordered 2 units PRBC. Hemoglobin remained stable after transfusion and Eliquis was held during hospitalization.  Bruising and pain improved in her right thigh. Due to ongoing risk for withholding Eliquis, she was instructed to resume Eliquis at discharge on 04/28/2022 and monitor for any further worsening of bruising or pain in her hip.  Bruising was outlined with skin marker during hospitalization.  She also was instructed to monitor for any signs of dizziness/lightheadedness or sustained increased heart rate.  She will also obtain hemoglobin check within 1 week after discharge to ensure stability.  Assessment and Plan: * ABLA (acute blood loss anemia) - Presumed bleeding into right thigh from traumatic fall - Eliquis held 8/16 - 8/18; okay to resume on 8/19 with instructions to monitor for worsening of bruising or pain/swelling in RLE - Hgb stable at 8.3 g/dL at discharge - repeat Hgb at follow up within 1 week  Hematoma of right thigh - s/p mechanical fall with large hematoma on CT (15 x 7.1 x 8 cm) -Bruising improved with monitoring during hospitalization.  Outlined with skin marker with recession of bruising appreciated -Compartment remained soft and pain slowly improved - Eliquis okay to be resumed on 04/28/2022; patient educated on what to look  out for after resuming Eliquis  Orthostatic hypotension-resolved as of 04/26/2022 - Orthostatic on admission - Repeat orthostatic blood pressure 8/17 is negative  Permanent atrial fibrillation (HCC) - Continue Eliquis and Toprol at discharge  Weakness generalized - likely due to anemia and significant Hgb drop - continue with PT while in hospital -Patient to resume outpatient therapy at discharge  Chronic kidney disease, stage 3b [facility]) - patient has history of CKD3b.  Baseline creat ~ 1.1-1.2, eGFR 40  Essential hypertension - Home regimen resumed at discharge   The patient's chronic medical conditions were treated accordingly per the patient's home medication regimen except as noted.  On day of discharge, patient was felt deemed stable for discharge. Patient/family member advised to call PCP or come back to ER if needed.   Principal Diagnosis: ABLA (acute blood loss anemia)  Discharge Diagnoses: Active Hospital Problems   Diagnosis Date Noted   ABLA (acute blood loss anemia) 11/20/2021    Priority: 1.   Hematoma of right thigh 04/25/2022    Priority: 2.   Permanent atrial fibrillation ([place]) 11/21/2012    Priority: 4.   Weakness generalized 04/25/2022   Chronic kidney disease, stage 3b ([place]) 12/24/2017   Essential hypertension 06/16/2007    Resolved Hospital Problems   Diagnosis Date Noted Date Resolved   Orthostatic hypotension 04/24/2022 04/26/2022    Priority: 2.     Discharge Instructions     Diet - low sodium heart healthy   Complete by: As directed    Increase activity slowly   Complete by: As directed       Allergies as of 04/27/2022       Reactions   Macrobid [nitrofurantoin Monohyd Macro] Other (See Comments)   Nausea, stomach cramps, fatigue , headache.   Meloxicam Other (See Comments)   Jittery and headache   Digoxin And Related Other (See Comments)   headaches   Diprolene [betamethasone Dipropionate Aug] Other (See Comments)   Patient states that the bruising was made worse when using this   Ferrous Sulfate Other (See Comments)   Bad constipation   Keflex [cephalexin] Nausea And Vomiting   Nausea, fatigue, and headache.   Mobic [meloxicam] Other (See Comments)   Unsure of reaction type   Sulfamethoxazole-trimethoprim Nausea Only        Medication List     STOP taking these medications    clobetasol ointment 0.05 % Commonly known as: TEMOVATE       TAKE these medications    acetaminophen 500 MG  tablet Commonly known as: TYLENOL Take 1,000 mg by mouth every 6 (six) hours as needed (body aches / sore hip). Maximum of 6 tablets daily (3021m)   apixaban 2.5 MG Tabs tablet Commonly known as: Eliquis Take 1 tablet (2.5 mg total) by mouth 2 (two) times daily.   Arnicare Gel Apply 1 application topically daily as needed (skin bruising).   atorvastatin 40 MG tablet Commonly known as: LIPITOR Take 1 tablet (40 mg total) by mouth every Monday, Wednesday, and Friday.   BIOTIN PO Take 5,000 mcg by mouth in the morning.   diclofenac Sodium 1 % Gel Commonly known as: VOLTAREN Apply 2 g topically 4 (four) times daily as needed (back pain.).   dicyclomine 20 MG tablet Commonly known as: BENTYL Take 1 by mouth every 4-6 hours as needed for cramping   doxycycline 100 MG tablet Commonly known as: VIBRA-TABS Take 1 tablet (100 mg total) by mouth 2 (two) times daily. What changed: additional instructions  DULoxetine 60 MG capsule Commonly known as: CYMBALTA Take 1 capsule (60 mg total) by mouth daily. Overdue for annual physical with labs   empagliflozin 10 MG Tabs tablet Commonly known as: Jardiance Take 1 tablet (10 mg total) by mouth daily before breakfast.   esomeprazole 40 MG capsule Commonly known as: NEXIUM TAKE ONE CAPSULE ONCE DAILY.   hyoscyamine 0.125 MG tablet Commonly known as: LEVSIN TAKE 1-2 TABLETS (0.125-0.25 MG) BY MOUTH EVERY 4 HOURS AS NEEDED FOR UP TO 10 DAYS FOR CRAMPING. What changed: See the new instructions.   LORazepam 1 MG tablet Commonly known as: ATIVAN TAKE ONE TABLET BY MOUTH TWICE DAILY AS NEEDED FOR ANXIETY / SLEEP. What changed: See the new instructions.   losartan 25 MG tablet Commonly known as: COZAAR TAKE 1 TABLET ONCE DAILY.   Lubricant Eye Drops 0.4-0.3 % Soln Generic drug: Polyethyl Glycol-Propyl Glycol Place 1-2 drops into both eyes at bedtime.   metoprolol succinate 25 MG 24 hr tablet Commonly known as: TOPROL-XL Take 1  tablet (25 mg total) by mouth daily.   nitroGLYCERIN 0.4 MG SL tablet Commonly known as: NITROSTAT Place 0.4 mg under the tongue every 5 (five) minutes as needed for chest pain (Call 911 at 3rd dose within 15 minutes.).   ondansetron 8 MG disintegrating tablet Commonly known as: ZOFRAN-ODT Take 1 tablet (8 mg total) by mouth every 8 (eight) hours as needed for nausea or vomiting.   oxyCODONE 5 MG immediate release tablet Commonly known as: Oxy IR/ROXICODONE Take 0.5 tablets (2.5 mg total) by mouth every 6 (six) hours as needed for up to 3 days for severe pain or breakthrough pain.   polyethylene glycol 17 g packet Commonly known as: MIRALAX / GLYCOLAX Take 17 g by mouth daily as needed for mild constipation.   PreserVision AREDS 2+Multi Vit Caps Take 1 capsule by mouth daily.   saccharomyces boulardii 250 MG capsule Commonly known as: Florastor Take 1 capsule (250 mg total) by mouth 2 (two) times daily. What changed: when to take this   sodium chloride 0.65 % Soln nasal spray Commonly known as: OCEAN Place 1 spray into both nostrils at bedtime as needed for congestion.   spironolactone 25 MG tablet Commonly known as: ALDACTONE Take 0.5 tablets (12.5 mg total) by mouth daily. What changed: when to take this   Vitamin B-12 1000 MCG Subl Place 1 tablet (1,000 mcg total) under the tongue daily.   Vitamin D3 50 MCG (2000 UT) capsule Take 1 capsule (2,000 Units total) by mouth daily.        Follow-up Information     Plotnikov, Evie Lacks, MD. Go on 05/11/2022.   Specialty: Internal Medicine Why: '@1' :20pm Contact information: [place] [address] [phone]         Belva Crome, MD .   Specialty: Cardiology Contact information: [phone] [address] [place] [address] [phone]         Vickie Epley, MD .   Specialties: Cardiology, Radiology Contact information: [place]  [id number] [phone]                Allergies  Allergen Reactions   Macrobid [Nitrofurantoin Monohyd Macro] Other (See Comments)    Nausea, stomach cramps, fatigue , headache.   Meloxicam Other (See Comments)    Jittery and headache   Digoxin And Related Other (See Comments)    headaches   Diprolene [Betamethasone Dipropionate Aug] Other (  See Comments)    Patient states that the bruising was made worse when using this   Ferrous Sulfate Other (See Comments)    Bad constipation    Keflex [Cephalexin] Nausea And Vomiting    Nausea, fatigue, and headache.   Mobic [Meloxicam] Other (See Comments)    Unsure of reaction type   Sulfamethoxazole-Trimethoprim Nausea Only    Consultations:   Procedures:   Discharge Exam: BP 115/75   Pulse 83   Temp 98.6 F (37 C) (Oral)   Resp 18   Wt 65.4 kg   SpO2 93%   BMI 28.16 kg/m  Physical Exam Constitutional:      General: She is not in acute distress.    Appearance: Normal appearance.  HENT:     Head: Normocephalic and atraumatic.     Mouth/Throat:     Mouth: Mucous membranes are moist.  Eyes:     Extraocular Movements: Extraocular movements intact.  Cardiovascular:     Rate and Rhythm: Normal rate and regular rhythm.  Pulmonary:     Effort: Pulmonary effort is normal.     Breath sounds: Normal breath sounds.  Abdominal:     General: Bowel sounds are normal. There is no distension.     Palpations: Abdomen is soft.     Tenderness: There is no abdominal tenderness.  Musculoskeletal:     Cervical back: Normal range of motion.     Comments: R upper thigh lateral noted with ~5cm area of bruising (now receding within skin marker) and mild firmness with minimal TTP (but stable and improved); edema extends to lower thigh, none in leg  Skin:    General: Skin is warm and dry.     Findings: Bruising present.     Comments: Generalized senile purpura   Neurological:     Mental Status: She is alert.     Comments: No  paresthesias; sensation intact and equal in B/L LE. Right DP noted and 2+  Psychiatric:        Mood and Affect: Mood normal.        Behavior: Behavior normal.      The results of significant diagnostics from this hospitalization (including imaging, microbiology, ancillary and laboratory) are listed below for reference.   Microbiology: No results found for this or any previous visit (from the past 240 hour(s)).   Labs: BNP (last 3 results) No results for input(s): "BNP" in the last 8760 hours. Basic Metabolic Panel: Recent Labs  Lab 04/24/22 1940 04/24/22 2010 04/26/22 0702 04/27/22 0623  NA 136 136 138 135  K 4.2 4.2 3.6 3.9  CL 110 108 112* 112*  CO2 18*  --  21* 19*  GLUCOSE 106* 98 100* 105*  BUN 24* 26* 20 14  CREATININE 1.22* 1.20* 1.08* 0.88  CALCIUM 8.2*  --  8.2* 8.4*  MG  --   --  2.1 2.0   Liver Function Tests: Recent Labs  Lab 04/24/22 1940  AST 19  ALT 9  ALKPHOS 47  BILITOT 0.6  PROT 5.3*  ALBUMIN 3.0*   No results for input(s): "LIPASE", "AMYLASE" in the last 168 hours. No results for input(s): "AMMONIA" in the last 168 hours. CBC: Recent Labs  Lab 04/24/22 1940 04/24/22 2010 04/25/22 0106 04/25/22 1421 04/25/22 2124 04/26/22 0702 04/26/22 1415 04/26/22 2242 04/27/22 0623  WBC 7.7  --  6.4  --   --   --   --   --   --   NEUTROABS 4.9  --   --   --   --   --   --   --   --  HGB 9.4*   < > 7.1*   < > 9.2* 8.9* 8.9* 8.6* 8.3*  HCT 29.6*   < > 22.0*   < > 27.4* 26.9* 27.0* 25.9* 24.3*  MCV 103.1*  --  100.9*  --   --   --   --   --   --   PLT 210  --  183  --   --   --   --   --   --    < > = values in this interval not displayed.   Cardiac Enzymes: No results for input(s): "CKTOTAL", "CKMB", "CKMBINDEX", "TROPONINI" in the last 168 hours. BNP: Invalid input(s): "POCBNP" CBG: No results for input(s): "GLUCAP" in the last 168 hours. D-Dimer No results for input(s): "DDIMER" in the last 72 hours. Hgb A1c No results for input(s):  "HGBA1C" in the last 72 hours. Lipid Profile No results for input(s): "CHOL", "HDL", "LDLCALC", "TRIG", "CHOLHDL", "LDLDIRECT" in the last 72 hours. Thyroid function studies No results for input(s): "TSH", "T4TOTAL", "T3FREE", "THYROIDAB" in the last 72 hours.  Invalid input(s): "FREET3" Anemia work up No results for input(s): "VITAMINB12", "FOLATE", "FERRITIN", "TIBC", "IRON", "RETICCTPCT" in the last 72 hours. Urinalysis    Component Value Date/Time   COLORURINE YELLOW 11/16/2021 1246   APPEARANCEUR CLEAR 11/16/2021 1246   LABSPEC 1.018 11/16/2021 1246   PHURINE 5.5 11/16/2021 1246   GLUCOSEU 3+ (A) 11/16/2021 1246   GLUCOSEU NEGATIVE 06/21/2021 1640   HGBUR 2+ (A) 11/16/2021 1246   BILIRUBINUR NEGATIVE 06/21/2021 1640   KETONESUR NEGATIVE 11/16/2021 1246   PROTEINUR NEGATIVE 11/16/2021 1246   UROBILINOGEN 0.2 06/21/2021 1640   NITRITE NEGATIVE 06/21/2021 1640   LEUKOCYTESUR NEGATIVE 06/21/2021 1640   Sepsis Labs Recent Labs  Lab 04/24/22 1940 04/25/22 0106  WBC 7.7 6.4   Microbiology No results found for this or any previous visit (from the past 240 hour(s)).  Procedures/Studies: CT FEMUR RIGHT WO CONTRAST  Result Date: 04/25/2022 CLINICAL DATA:  Soft tissue mass, deep thigh. Chronic falls over the past 10 days. EXAM: CT OF THE LOWER RIGHT EXTREMITY WITHOUT CONTRAST TECHNIQUE: Multidetector CT imaging of the right lower extremity was performed according to the standard protocol. RADIATION DOSE REDUCTION: This exam was performed according to the departmental dose-optimization program which includes automated exposure control, adjustment of the mA and/or kV according to patient size and/or use of iterative reconstruction technique. COMPARISON:  None Available. FINDINGS: Bones/Joint/Cartilage No acute fracture or dislocation. Joint space narrowing and subchondral sclerosis is present in the medial and lateral compartments. No significant joint effusion. Ligaments Suboptimally  assessed by CT. Muscles and Tendons No intramuscular hematoma. Soft tissues Vascular calcifications are noted in the soft tissues. There is subcutaneous fat stranding along the lateral aspect of the hip and thigh. Hyperdense collections are noted lateral to the right hip in the subcutaneous soft tissues, the largest measuring 15.0 x 7.1 x 8.0 cm. IMPRESSION: 1. No acute fracture or dislocation. 2. Subcutaneous fat stranding over the lateral aspect of the right hip and thigh compatible with contusion. There are hyperdense collections lateral to the right hip, the largest measuring 15.0 x 7.1 x 8.0 cm suggesting hematoma given history of falls. Electronically Signed   By: Brett Fairy M.D.   On: 04/25/2022 02:28   MR BRAIN WO CONTRAST  Result Date: 04/25/2022 CLINICAL DATA:  Frequent falls, dizziness EXAM: MRI HEAD WITHOUT CONTRAST TECHNIQUE: Multiplanar, multiecho pulse sequences of the brain and surrounding structures were obtained without intravenous contrast. COMPARISON:  No  prior MRI, correlation is made with CTA head neck 04/24/2022 FINDINGS: Brain: No restricted diffusion to suggest acute or subacute infarct. No acute hemorrhage, mass, mass effect, or midline shift. No hydrocephalus or extra-axial collection. No hemosiderin deposition to suggest remote hemorrhage. Scattered T2 hyperintense signal in the periventricular white matter and pons, likely the sequela of mild-to-moderate chronic small vessel ischemic disease. Remote small bilateral parietal cortical infarcts. Remote lacunar infarcts in the left corona radiata, left cerebellum, and basal ganglia. Vascular: Patent arterial flow voids. Skull and upper cervical spine: Normal marrow signal. Mild anterolisthesis of C3 on C4. Sinuses/Orbits: Mild mucosal thickening in the ethmoid air cells. Status post bilateral lens replacements. Other: The mastoids are well aerated. IMPRESSION: No acute intracranial process. No evidence of acute or subacute infarct.  Electronically Signed   By: Merilyn Baba M.D.   On: 04/25/2022 01:45   CT ANGIO HEAD NECK W WO CM  Result Date: 04/24/2022 CLINICAL DATA:  Chronic falls; weakness and dizziness EXAM: CT ANGIOGRAPHY HEAD AND NECK TECHNIQUE: Multidetector CT imaging of the head and neck was performed using the standard protocol during bolus administration of intravenous contrast. Multiplanar CT image reconstructions and MIPs were obtained to evaluate the vascular anatomy. Carotid stenosis measurements (when applicable) are obtained utilizing NASCET criteria, using the distal internal carotid diameter as the denominator. RADIATION DOSE REDUCTION: This exam was performed according to the departmental dose-optimization program which includes automated exposure control, adjustment of the mA and/or kV according to patient size and/or use of iterative reconstruction technique. CONTRAST:  105m OMNIPAQUE IOHEXOL 350 MG/ML SOLN COMPARISON:  11/19/2021 CT head no prior CTA FINDINGS: CT HEAD FINDINGS Brain: No evidence of acute infarct, hemorrhage, mass, mass effect, or midline shift. No hydrocephalus or extra-axial fluid collection. 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 Vascular: No hyperdense vessel. Skull: Normal. Negative for fracture or focal lesion. Sinuses/Orbits: Mild mucosal thickening in the anterior ethmoid air cells and left frontal sinus Other: The mastoid air cells are well aerated. CTA NECK FINDINGS Aortic arch: Standard branching. Imaged portion shows no evidence of aneurysm or dissection. No hemodynamically significant stenosis of the major arch vessel origins. Approximate 30% luminal narrowing of the left subclavian artery (series 14, image 113). Right carotid system: No evidence of dissection, occlusion, or hemodynamically significant stenosis (greater than 50%). In the distal right internal carotid artery, there is an area of mild dilatation, measuring up to 6 mm  (series 12, image 143), compared to 3 mm in the more proximal ICA, returning closer to the normal caliber by the petrous segment. Left carotid system: No evidence of dissection, occlusion, or hemodynamically significant stenosis (greater than 50%). Vertebral arteries: No evidence of dissection, occlusion, or hemodynamically significant stenosis (greater than 50%). Skeleton: Degenerative changes in the cervical spine, with reversal of the normal cervical lordosis and trace anterolisthesis of C3 on C4. Trace retrolisthesis of C5 on C6. No acute osseous abnormality. Other neck: Negative. Upper chest: No focal pulmonary opacity or pleural effusion. Review of the MIP images confirms the above findings CTA HEAD FINDINGS Anterior circulation: Both internal carotid arteries are patent to the termini, with mild-to-moderate stenosis in the left supraclinoid ICA. A1 segments patent. Normal anterior communicating artery. Anterior cerebral arteries are patent to their distal aspects. No M1 stenosis or occlusion. MCA branches perfused and symmetric. Posterior circulation: Vertebral arteries patent to the vertebrobasilar junction without stenosis. Mild dilatation of the mid left V4, which measures 3 mm in the  proximal left V4, enlarges to up to 4 mm, and then narrows to 2 mm just proximal to the vertebrobasilar junction. Basilar patent to its distal aspect. Superior cerebellar arteries patent proximally. Patent P1 segments. PCAs perfused to their distal aspects without stenosis. The bilateral posterior communicating arteries are not visualized. Venous sinuses: As permitted by contrast timing, patent. Anatomic variants: None significant. Review of the MIP images confirms the above findings IMPRESSION: 1. No acute intracranial process. 2. No intracranial large vessel occlusion. Mild-to-moderate stenosis in the left supraclinoid ICA. 3. No hemodynamically significant stenosis in the neck. 4. 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 Electronically Signed   By: Merilyn Baba M.D.   On: 04/24/2022 22:46   CT C-SPINE NO CHARGE  Result Date: 04/24/2022 CLINICAL DATA:  Fall. EXAM: CT CERVICAL SPINE WITHOUT CONTRAST TECHNIQUE: Multidetector CT imaging of the cervical spine was performed without intravenous contrast. Multiplanar CT image reconstructions were also generated. RADIATION DOSE REDUCTION: This exam was performed according to the departmental dose-optimization program which includes automated exposure control, adjustment of the mA and/or kV according to patient size and/or use of iterative reconstruction technique. COMPARISON:  Cervical spine CT 08/25/2019 FINDINGS: Alignment: There is 2 mm of anterolisthesis at C3-C4 which is unchanged and favored is degenerative. Alignment is otherwise anatomic. Skull base and vertebrae: No acute fracture. No primary bone lesion or focal pathologic process. Bones are osteopenic. Soft tissues and spinal canal: No prevertebral fluid or swelling. No visible canal hematoma. Disc levels: Disc space narrowing and endplate osteophyte formation is seen throughout the cervical spine from C4 through T1 similar to the prior study. There is no severe central canal or neural foraminal stenosis at any level. Upper chest: Negative. Other: None. IMPRESSION: No acute fracture or traumatic subluxation of the cervical spine. Electronically Signed   By: Ronney Asters M.D.   On: 04/24/2022 21:45     Time coordinating discharge: Over 30 minutes    Dwyane Dee, MD  [facility] 04/27/2022, 3:01 PM

## 2022-04-27 NOTE — Discharge Instructions (Signed)
Resume Eliquis on 8/19.   Monitor the bruising for any worsening or expanding, if so, stop Eliquis again. If pain worsens or you feel dizzy/lightheaded then seek medical attention again.   Have your hemoglobin repeated again in 3-4 days

## 2022-04-27 NOTE — Telephone Encounter (Signed)
Pt would like a callback regarding just being discharged from hospital and needing an order put in to have labs done per hospital. Please advise

## 2022-04-27 NOTE — Telephone Encounter (Signed)
Returned call to patient, she states she is currently in the hospital and being discharged today.  She reports she fell and has a hematoma. She received 2 units of blood while in the hospital this week and was advised by team at hospital to have follow-up labs for a CBC on Monday 04/30/22.   Patient has not yet received discharge paperwork/instructions. Discussed with Dr. Tamala Julian, we will wait until discharge has been processed and follow-up as necessary. Dr. Tamala Julian said OK to order CBC for 04/30/22 if requested by hospital in discharge summary.  Will return call to patient later today to discuss. Patient verbalized understanding.

## 2022-04-27 NOTE — Progress Notes (Signed)
Physical Therapy Treatment Patient Details Name: Hailey [name] MRN: [id number] DOB: [birth year] Today's Date: 04/27/2022   History of Present Illness  Hailey [name] is a 87 y.o. female admitted 8/15 and was brought to the ER after patient was found to be very weak. Over the last 10 days patient had at least 3 falls. Last one was 8/14. As per the husband patient tripped on his foot and fell onto the floor but did not hit her head or lose consciousness. Pt with right thigh hematoma and anemia and receiving transfusion. PMH: CAD status post stenting, permanent atrial fibrillation, chronic kidney disease stage III, iron deficiency anemia and history of factor XI deficiency    PT Comments    Pt w/ improved participation w/ therapy today, although c/o soreness to R thigh hematoma.  Pt transfers sup to sit w/ supervision and minimal HOB elevation, min guard for sit to stand.  Pt progressing to goals and appropriate to return to home.  Recommendations for follow up therapy are one component of a multi-disciplinary discharge planning process, led by the attending physician.  Recommendations may be updated based on patient status, additional functional criteria and insurance authorization.  Follow Up Recommendations  Outpatient PT     Assistance Recommended at Discharge Intermittent Supervision/Assistance  Patient can return home with the following Help with stairs or ramp for entrance;A little help with walking and/or transfers   Equipment Recommendations       Recommendations for Other Services       Precautions / Restrictions Precautions Precautions: Fall Restrictions Weight Bearing Restrictions: No     Mobility  Bed Mobility Overal bed mobility: Needs Assistance Bed Mobility: Supine to Sit     Supine to sit: Min guard, Supervision     General bed mobility comments: supervision w/o use of siderails.    Transfers Overall transfer level: Needs assistance Equipment used:  Rolling walker (2 wheels) Transfers: Sit to/from Stand Sit to Stand: Min guard           General transfer comment: pt has no c/o weakness today other than "laying in bed."    Ambulation/Gait Ambulation/Gait assistance: Supervision Gait Distance (Feet): 120 Feet Assistive device: Rolling walker (2 wheels) Gait Pattern/deviations: Step-through pattern   Gait velocity interpretation: 1.31 - 2.62 ft/sec, indicative of limited community ambulator           Modified Rankin (Stroke Patients Only)       Balance           Standing balance support: Reliant on assistive device for balance Standing balance-Leahy Scale: Fair                              Cognition Arousal/Alertness: Awake/alert Behavior During Therapy: WFL for tasks assessed/performed                                            Exercises General Exercises - Lower Extremity Ankle Circles/Pumps: AROM, 10 reps (encouraged to perform LE ther ex when sitting up in chair.) Long Arc Quad: AROM, Both, 10 reps, Seated    General Comments        Pertinent Vitals/Pain Pain Assessment Pain Assessment: No/denies pain    Home Living  Prior Function            PT Goals (current goals can now be found in the care plan section)      Frequency    Min 3X/week      PT Plan Current plan remains appropriate    Co-evaluation              AM-PAC PT "6 Clicks" Mobility   Outcome Measure  Help needed turning from your back to your side while in a flat bed without using bedrails?: None Help needed moving from lying on your back to sitting on the side of a flat bed without using bedrails?: None Help needed moving to and from a bed to a chair (including a wheelchair)?: A Little Help needed standing up from a chair using your arms (e.g., wheelchair or bedside chair)?: A Little Help needed to walk in hospital room?: A Little Help needed  climbing 3-5 steps with a railing? : A Lot 6 Click Score: 19    End of Session Equipment Utilized During Treatment: Gait belt Activity Tolerance: Patient limited by fatigue (but appears improved from past sessions.) Patient left: in chair;with call bell/phone within reach Nurse Communication: Mobility status PT Visit Diagnosis: Pain;Muscle weakness (generalized) (M62.81);Other abnormalities of gait and mobility (R26.89) Pain - Right/Left: Right Pain - part of body: Leg (especially w/ sitting, but states improvement w/ activity.)     Time: 4383-8184 PT Time Calculation (min) (ACUTE ONLY): 30 min  Charges:  $Gait Training: 23-37 mins                     Ladoris Gene, PT    Ladoris Gene 04/27/2022, 12:26 PM

## 2022-04-27 NOTE — Care Management Important Message (Signed)
Important Message  Patient Details  Name: Hailey [name] MRN: [id number] Date of Birth: [birth year]   Medicare Important Message Given:  Yes     [name] Montine Circle 04/27/2022, 1:57 PM

## 2022-04-27 NOTE — TOC Transition Note (Signed)
Transition of Care (TOC) - CM/SW Discharge Note Marvetta Gibbons RN, BSN Transitions of Care Unit 4E- RN Case Manager See Treatment Team for direct phone #  Cross coverage for [place]  Patient Details  Name: Hailey [name] MRN: [id number] Date of Birth: [birth year]  Transition of Care [facility]) CM/SW Contact:  Dawayne Patricia, RN Phone Number: 04/27/2022, 12:18 PM   Clinical Narrative:    Pt stable for transition home, noted recs for outpt PT- msg received from RN that pt and spouse at bedside and want to discuss home services.  CM went to room to discuss transition needs.  Per pt she already has active referral for outpt PT at [facility] street location and has an appointment already in place for eval.  Discussed recommendations by therapy here for outpt therapy and not HH.  Per pt and spouse they already have assistance coming into the home 3x week for household needs (cleaning, laundry, etc)- and can increase to daily short term if needed. Pt also has RW and BSC at home. Pt and spouse feel like pt does not need assistance with ADLs- and if she does voiced there is family that can assist.  At this time pt voicing that she wants to keep with the plan for outpt PT and declines HH.  CM will check on outpt referral in epic system- pt to f/u on appointment w/ [facility] [address] outpt therapy.   Per epic system pt has active referral and auth for outpt PT at [facility] street location. No further TOC needs.    Final next level of care: OP Rehab Barriers to Discharge: No Barriers Identified   Patient Goals and CMS Choice Patient states their goals for this hospitalization and ongoing recovery are:: return home CMS Medicare.gov Compare Post Acute Care list provided to:: Patient Choice offered to / list presented to : Patient, Spouse  Discharge Placement               home        Discharge Plan and Services   Discharge Planning Services: CM Consult Post Acute Care Choice: Home Health           DME Arranged: N/A DME Agency: NA       HH Arranged: Patient Refused [facility] (Pt has referral for outpt PT- and appointment- would like to still do outpt and not HH) [place] Agency: NA        Social Determinants of Health ([place]) Interventions     Readmission Risk Interventions    04/27/2022   12:18 PM  Readmission Risk Prevention Plan  Transportation Screening Complete  PCP or Specialist Appt within 5-7 Days Not Complete  Not Complete comments appointment on 9/1  Home Care Screening Complete  Medication Review (RN CM) Complete

## 2022-04-27 NOTE — Progress Notes (Signed)
Mobility Specialist - Progress Note   04/27/22 1125  Mobility  Activity Ambulated with assistance in hallway  Level of Assistance Contact guard assist, steadying assist  Assistive Device Front wheel walker  Distance Ambulated (ft) 220 ft  Activity Response Tolerated well  $Mobility charge 1 Mobility    Pre-mobility: 87 HR,126/86 (97) BP During mobility: 144 HR Post-mobility: 98 HR,120/80(93) BP  Pt was received in chair and agreeable to mobility. No c/o pain throughout ambulation. Pt was returned to chair with all needs met.   Larey Seat

## 2022-04-30 ENCOUNTER — Other Ambulatory Visit: Payer: Self-pay | Admitting: Interventional Cardiology

## 2022-04-30 ENCOUNTER — Encounter: Payer: Self-pay | Admitting: Interventional Cardiology

## 2022-04-30 ENCOUNTER — Telehealth: Payer: Self-pay

## 2022-04-30 ENCOUNTER — Other Ambulatory Visit: Payer: Medicare Other

## 2022-04-30 DIAGNOSIS — I4821 Permanent atrial fibrillation: Secondary | ICD-10-CM

## 2022-04-30 NOTE — Telephone Encounter (Signed)
Received call from patient following up on recommended labs after hospitalization.  Per D/C summary: follow-up Hgb 8/21 or 8/22.  Lab appt made and CBC ordered.  Patient verbalized understanding and expressed appreciation for assistance.

## 2022-04-30 NOTE — Telephone Encounter (Addendum)
Transition Care Management Unsuccessful Follow-up Telephone Call  Date of discharge and from where:  [place] 04-27-22 Dx: acute blood loss anemia  Attempts:  1st Attempt  Reason for unsuccessful TCM follow-up call:  Left voice message   Transition Care Management Follow-up Telephone Call Date of discharge and from where: [place] 04-27-22 Dx: acute blood loss anemia How have you been since you were released from the hospital? Doing a little better  Any questions or concerns? No  Items Reviewed: Did the pt receive and understand the discharge instructions provided? Yes  Medications obtained and verified? Yes  Other? No  Any new allergies since your discharge? No  Dietary orders reviewed? Yes Do you have support at home? Yes   Home Care and Equipment/Supplies: Were home health services ordered? no If so, what is the name of the agency? na  Has the agency set up a time to come to the patient's home? not applicable Were any new equipment or medical supplies ordered?  No What is the name of the medical supply agency? na Were you able to get the supplies/equipment? not applicable Do you have any questions related to the use of the equipment or supplies? No  Functional Questionnaire: (I = Independent and D = Dependent) ADLs: I  Bathing/Dressing- I  Meal Prep- I  Eating- I  Maintaining continence- I  Transferring/Ambulation- I-walker  Managing Meds- I Follow up appointments reviewed:  PCP Hospital f/u appt confirmed? Yes  Scheduled to see Dr Alain Marion on 05-03-22 @ 240pm. [facility] f/u appt confirmed? Yes  Scheduled to see Dr Tamala Julian on 05-28-22 @ 145pm. Are transportation arrangements needed? No  If their condition worsens, is the pt aware to call PCP or go to the Emergency Dept.? Yes Was the patient provided with contact information for the PCP's office or ED? Yes Was to pt encouraged to call back with questions or concerns? Yes

## 2022-04-30 NOTE — Addendum Note (Signed)
Addended by: Molli Barrows on: 04/30/2022 09:45 AM   Modules accepted: Orders

## 2022-05-01 NOTE — Telephone Encounter (Signed)
Prescription refill request for Eliquis received. Indication: PAF Last office visit:  11/23/21  Elwyn Reach NP Scr: 0.88 on 04/27/22 Age: 87 Weight: 64.8  Continue Eliquis 2.'5mg'$  twice daily per MD.  Refill approved.

## 2022-05-03 ENCOUNTER — Encounter: Payer: Self-pay | Admitting: Internal Medicine

## 2022-05-03 ENCOUNTER — Ambulatory Visit (INDEPENDENT_AMBULATORY_CARE_PROVIDER_SITE_OTHER): Payer: Medicare Other | Admitting: Internal Medicine

## 2022-05-03 ENCOUNTER — Ambulatory Visit: Payer: Medicare Other | Admitting: Physical Therapy

## 2022-05-03 VITALS — BP 100/62 | HR 64 | Temp 97.9°F | Ht 63.0 in | Wt 146.8 lb

## 2022-05-03 DIAGNOSIS — S7011XA Contusion of right thigh, initial encounter: Secondary | ICD-10-CM

## 2022-05-03 DIAGNOSIS — R531 Weakness: Secondary | ICD-10-CM | POA: Diagnosis not present

## 2022-05-03 DIAGNOSIS — I10 Essential (primary) hypertension: Secondary | ICD-10-CM | POA: Diagnosis not present

## 2022-05-03 DIAGNOSIS — R296 Repeated falls: Secondary | ICD-10-CM | POA: Diagnosis not present

## 2022-05-03 DIAGNOSIS — D500 Iron deficiency anemia secondary to blood loss (chronic): Secondary | ICD-10-CM | POA: Diagnosis not present

## 2022-05-03 LAB — COMPREHENSIVE METABOLIC PANEL
ALT: 12 U/L (ref 0–35)
AST: 21 U/L (ref 0–37)
Albumin: 3.6 g/dL (ref 3.5–5.2)
Alkaline Phosphatase: 66 U/L (ref 39–117)
BUN: 21 mg/dL (ref 6–23)
CO2: 25 mEq/L (ref 19–32)
Calcium: 8.6 mg/dL (ref 8.4–10.5)
Chloride: 104 mEq/L (ref 96–112)
Creatinine, Ser: 1.03 mg/dL (ref 0.40–1.20)
GFR: 49.61 mL/min — ABNORMAL LOW (ref >60.00)
Glucose, Bld: 100 mg/dL — ABNORMAL HIGH (ref 70–99)
Potassium: 4.4 mEq/L (ref 3.5–5.1)
Sodium: 137 mEq/L (ref 135–145)
Total Bilirubin: 0.9 mg/dL (ref 0.2–1.2)
Total Protein: 6.5 g/dL (ref 6.0–8.3)

## 2022-05-03 LAB — CBC WITH DIFFERENTIAL/PLATELET
Basophils Absolute: 0 10*3/uL (ref 0.0–0.1)
Basophils Relative: 0.5 % (ref 0.0–3.0)
Eosinophils Absolute: 0.6 10*3/uL (ref 0.0–0.7)
Eosinophils Relative: 8.2 % — ABNORMAL HIGH (ref 0.0–5.0)
HCT: 25.1 % — ABNORMAL LOW (ref 36.0–46.0)
Hemoglobin: 8.2 g/dL — ABNORMAL LOW (ref 12.0–15.0)
Lymphocytes Relative: 17.2 % (ref 12.0–46.0)
Lymphs Abs: 1.3 10*3/uL (ref 0.7–4.0)
MCHC: 32.8 g/dL (ref 30.0–36.0)
MCV: 95.4 fl (ref 78.0–100.0)
Monocytes Absolute: 0.8 10*3/uL (ref 0.1–1.0)
Monocytes Relative: 9.6 % (ref 3.0–12.0)
Neutro Abs: 5.1 10*3/uL (ref 1.4–7.7)
Neutrophils Relative %: 64.5 % (ref 43.0–77.0)
Platelets: 344 10*3/uL (ref 150.0–400.0)
RBC: 2.63 Mil/uL — ABNORMAL LOW (ref 3.87–5.11)
RDW: 20 % — ABNORMAL HIGH (ref 11.5–15.5)
WBC: 7.9 10*3/uL (ref 4.0–10.5)

## 2022-05-03 MED ORDER — DULOXETINE HCL 20 MG PO CPEP: ORAL_CAPSULE | ORAL | 3 refills | Status: DC

## 2022-05-03 MED ORDER — METOPROLOL SUCCINATE ER 25 MG PO TB24
12.5000 mg | ORAL_TABLET | Freq: Every day | ORAL | 3 refills | Status: DC
Start: 2022-05-03 — End: 2022-05-29

## 2022-05-03 MED ORDER — LOSARTAN POTASSIUM 25 MG PO TABS
12.5000 mg | ORAL_TABLET | Freq: Every day | ORAL | 3 refills | Status: DC
Start: 2022-05-03 — End: 2022-06-18

## 2022-05-03 NOTE — Progress Notes (Signed)
Subjective:  Patient ID: Hailey [name], female    DOB: 1935-07-07  Age: 87 y.o. MRN: [id number]  CC: Follow-up (HOSP  F/U)   HPI Hailey [name] presents for falls, CHF, anemia S/p recent fall w/R hip hematoma  Per hx:  Admit date: 04/24/2022 Discharge date: 04/27/2022  Barriers to discharge: none   Admitted From: Home Disposition:  Home Discharging physician: Dwyane Dee, MD   Recommendations for Outpatient Follow-up:  Please try to recheck Hgb within 1 week of discharge; ideally by 8/21 or 8/22 if able If does re-bleed, may need Eliquis held longer    Patient to continue with outpatient PT at discharge    Discharge Condition: stable CODE STATUS: Full Diet recommendation:  Diet Orders (From admission, onward)        Start     Ordered    04/27/22 0000   Diet - low sodium heart healthy        04/27/22 1109    04/25/22 0022   Diet Heart Room service appropriate? Yes; Fluid consistency: Thin  Diet effective now       Question Answer Comment  Room service appropriate? Yes    Fluid consistency: Thin       04/25/22 0021                  Hospital Course: Hailey [name] is an 87 y.o. female with history of CAD status post stenting, permanent atrial fibrillation, CKD3b, iron deficiency anemia and history of factor XI deficiency who was brought to the ER after patient was found to be very weak.   She appears to have a history of easily falling and has had more falls over the past approximate week prior to admission.  Her falls appear mechanical in nature and she denies any presyncopal symptoms prior to.  Patient had tripped over her husband's foot prior to admission and landed on her right side. She began developing swelling in her right hip and due to her lethargy she was brought to the ER and underwent some work-up for possible stroke. MRI brain was negative for acute intracranial processes.  CT angio head/neck was also negative for acute findings.   CT right femur  was performed and showed hyperdense collection in the lateral right hip measuring 15 x 7.1 x 8 cm concerning for hematoma.  Eliquis was held on admission and she was admitted for further monitoring of her hip. She was found to be orthostatic as well.  Hemoglobin also down trended to 7.1 and she was ordered 2 units PRBC. Hemoglobin remained stable after transfusion and Eliquis was held during hospitalization.  Bruising and pain improved in her right thigh. Due to ongoing risk for withholding Eliquis, she was instructed to resume Eliquis at discharge on 04/28/2022 and monitor for any further worsening of bruising or pain in her hip.  Bruising was outlined with skin marker during hospitalization.  She also was instructed to monitor for any signs of dizziness/lightheadedness or sustained increased heart rate.  She will also obtain hemoglobin check within 1 week after discharge to ensure stability.   Assessment and Plan: * ABLA (acute blood loss anemia) - Presumed bleeding into right thigh from traumatic fall - Eliquis held 8/16 - 8/18; okay to resume on 8/19 with instructions to monitor for worsening of bruising or pain/swelling in RLE - Hgb stable at 8.3 g/dL at discharge - repeat Hgb at follow up within 1 week   Hematoma of right thigh - s/p mechanical  fall with large hematoma on CT (15 x 7.1 x 8 cm) -Bruising improved with monitoring during hospitalization.  Outlined with skin marker with recession of bruising appreciated -Compartment remained soft and pain slowly improved - Eliquis okay to be resumed on 04/28/2022; patient educated on what to look out for after resuming Eliquis   Orthostatic hypotension-resolved as of 04/26/2022 - Orthostatic on admission - Repeat orthostatic blood pressure 8/17 is negative   Permanent atrial fibrillation (HCC) - Continue Eliquis and Toprol at discharge   Weakness generalized - likely due to anemia and significant Hgb drop - continue with PT while in  hospital -Patient to resume outpatient therapy at discharge   Chronic kidney disease, stage 3b ([place]) - patient has history of CKD3b. Baseline creat ~ 1.1-1.2, eGFR 40   Essential hypertension - Home regimen resumed at discharge     The patient's chronic medical conditions were treated accordingly per the patient's home medication regimen except as noted.  On day of discharge, patient was felt deemed stable for discharge. Patient/family member advised to call PCP or come back to ER if needed.    Principal Diagnosis: ABLA (acute blood loss anemia)   Discharge Diagnoses:       Active Hospital Problems    Diagnosis Date Noted   ABLA (acute blood loss anemia) 11/20/2021      Priority: 1.   Hematoma of right thigh 04/25/2022      Priority: 2.   Permanent atrial fibrillation ([place]) 11/21/2012      Priority: 4.   Weakness generalized 04/25/2022   Chronic kidney disease, stage 3b ([place]) 12/24/2017   Essential hypertension 06/16/2007        Outpatient Medications Prior to Visit  Medication Sig Dispense Refill   acetaminophen (TYLENOL) 500 MG tablet Take 1,000 mg by mouth every 6 (six) hours as needed (body aches / sore hip). Maximum of 6 tablets daily (3076m)     apixaban (ELIQUIS) 2.5 MG TABS tablet TAKE 1 TABLET BY MOUTH TWICE  DAILY 200 tablet 1   atorvastatin (LIPITOR) 40 MG tablet Take 1 tablet (40 mg total) by mouth every Monday, Wednesday, and Friday. 45 tablet 3   BIOTIN PO Take 5,000 mcg by mouth in the morning.     Cholecalciferol (VITAMIN D3) 50 MCG (2000 UT) capsule Take 1 capsule (2,000 Units total) by mouth daily. 100 capsule 3   Cyanocobalamin (VITAMIN B-12) 1000 MCG SUBL Place 1 tablet (1,000 mcg total) under the tongue daily. 100 tablet 3   diclofenac Sodium (VOLTAREN) 1 % GEL Apply 2 g topically 4 (four) times daily as needed (back pain.).     dicyclomine (BENTYL) 20 MG tablet Take 1 by mouth every 4-6 hours as needed for cramping 30 tablet 3   empagliflozin  (JARDIANCE) 10 MG TABS tablet Take 1 tablet (10 mg total) by mouth daily before breakfast. 90 tablet 2   esomeprazole (NEXIUM) 40 MG capsule TAKE ONE CAPSULE ONCE DAILY. (Patient taking differently: Take 40 mg by mouth daily.) 90 capsule 1   Homeopathic Products (ARNICARE) GEL Apply 1 application topically daily as needed (skin bruising).     hyoscyamine (LEVSIN) 0.125 MG tablet TAKE 1-2 TABLETS (0.125-0.25 MG) BY MOUTH EVERY 4 HOURS AS NEEDED FOR UP TO 10 DAYS FOR CRAMPING. (Patient taking differently: Take 0.125-0.25 mg by mouth every 4 (four) hours as needed for cramping.) 100 tablet 1   LORazepam (ATIVAN) 1 MG tablet TAKE ONE TABLET BY MOUTH TWICE DAILY AS NEEDED FOR ANXIETY / SLEEP. (  Patient taking differently: Take 1-1.5 mg by mouth at bedtime.) 180 tablet 1   Multiple Vitamins-Minerals (PRESERVISION AREDS 2+MULTI VIT) CAPS Take 1 capsule by mouth daily.     nitroGLYCERIN (NITROSTAT) 0.4 MG SL tablet Place 0.4 mg under the tongue every 5 (five) minutes as needed for chest pain (Call 911 at 3rd dose within 15 minutes.).     ondansetron (ZOFRAN-ODT) 8 MG disintegrating tablet Take 1 tablet (8 mg total) by mouth every 8 (eight) hours as needed for nausea or vomiting. 12 tablet 0   Polyethyl Glycol-Propyl Glycol (LUBRICANT EYE DROPS) 0.4-0.3 % SOLN Place 1-2 drops into both eyes at bedtime.     polyethylene glycol (MIRALAX / GLYCOLAX) 17 g packet Take 17 g by mouth daily as needed for mild constipation.     saccharomyces boulardii (FLORASTOR) 250 MG capsule Take 1 capsule (250 mg total) by mouth 2 (two) times daily. (Patient taking differently: Take 250 mg by mouth daily.) 60 capsule 2   sodium chloride (OCEAN) 0.65 % SOLN nasal spray Place 1 spray into both nostrils at bedtime as needed for congestion.     spironolactone (ALDACTONE) 25 MG tablet Take 0.5 tablets (12.5 mg total) by mouth daily. (Patient taking differently: Take 12.5 mg by mouth every Monday, Wednesday, and Friday.) 45 tablet 3    DULoxetine (CYMBALTA) 60 MG capsule Take 1 capsule (60 mg total) by mouth daily. Overdue for annual physical with labs 30 capsule 2   losartan (COZAAR) 25 MG tablet TAKE 1 TABLET ONCE DAILY. (Patient taking differently: Take 25 mg by mouth daily.) 90 tablet 3   metoprolol succinate (TOPROL-XL) 25 MG 24 hr tablet Take 1 tablet (25 mg total) by mouth daily. 90 tablet 3   doxycycline (VIBRA-TABS) 100 MG tablet Take 1 tablet (100 mg total) by mouth 2 (two) times daily. (Patient not taking: Reported on 04/30/2022) 28 tablet 0   No facility-administered medications prior to visit.    ROS: Review of Systems  Constitutional:  Positive for fatigue. Negative for activity change, appetite change, chills and unexpected weight change.  HENT:  Negative for congestion, mouth sores and sinus pressure.   Eyes:  Negative for visual disturbance.  Respiratory:  Negative for cough and chest tightness.   Gastrointestinal:  Negative for abdominal pain and nausea.  Genitourinary:  Negative for difficulty urinating, frequency and vaginal pain.  Musculoskeletal:  Positive for arthralgias and gait problem. Negative for back pain.  Skin:  Positive for color change. Negative for pallor, rash and wound.  Neurological:  Positive for weakness and light-headedness. Negative for dizziness, tremors, numbness and headaches.  Hematological:  Bruises/bleeds easily.  Psychiatric/Behavioral:  Negative for confusion and sleep disturbance.     Objective:  BP 100/62 (BP Location: Left Arm)   Pulse 64   Temp 97.9 F (36.6 C) (Oral)   Ht _0  (1.6 m)   Wt 146 lb 12.8 oz (66.6 kg)   SpO2 93%   BMI 26.00 kg/m   BP Readings from Last 3 Encounters:  05/03/22 100/62  04/27/22 115/75  04/23/22 111/73    Wt Readings from Last 3 Encounters:  05/03/22 146 lb 12.8 oz (66.6 kg)  04/27/22 144 lb 2.9 oz (65.4 kg)  04/23/22 137 lb 9.6 oz (62.4 kg)    Physical Exam Constitutional:      General: She is not in acute distress.     Appearance: She is well-developed. She is not ill-appearing.  HENT:     Head: Normocephalic.  Right Ear: External ear normal.     Left Ear: External ear normal.     Nose: Nose normal.  Eyes:     General:        Right eye: No discharge.        Left eye: No discharge.     Conjunctiva/sclera: Conjunctivae normal.     Pupils: Pupils are equal, round, and reactive to light.  Neck:     Thyroid: No thyromegaly.     Vascular: No JVD.     Trachea: No tracheal deviation.  Cardiovascular:     Rate and Rhythm: Normal rate and regular rhythm.     Heart sounds: Normal heart sounds.  Pulmonary:     Effort: No respiratory distress.     Breath sounds: No stridor. No wheezing.  Abdominal:     General: Bowel sounds are normal. There is no distension.     Palpations: Abdomen is soft. There is no mass.     Tenderness: There is no abdominal tenderness. There is no guarding or rebound.  Musculoskeletal:        General: Swelling and tenderness present.     Cervical back: Normal range of motion and neck supple. No rigidity.  Lymphadenopathy:     Cervical: No cervical adenopathy.  Skin:    Findings: No erythema or rash.  Neurological:     Cranial Nerves: No cranial nerve deficit.     Motor: No abnormal muscle tone.     Coordination: Coordination normal.     Deep Tendon Reflexes: Reflexes normal.  Psychiatric:        Behavior: Behavior normal.        Thought Content: Thought content normal.        Judgment: Judgment normal.   Large right buttock and right thigh hematoma.  No skin breakdowns.  Bruises on forearms.  Using a walker.  She is here with her husband Josph Macho  Lab Results  Component Value Date   WBC 7.9 05/03/2022   HGB 8.2 Repeated and verified X2. (L) 05/03/2022   HCT 25.1 (L) 05/03/2022   PLT 344.0 05/03/2022   GLUCOSE 100 (H) 05/03/2022   CHOL 167 01/29/2022   TRIG 113 01/29/2022   HDL 109 01/29/2022   LDLDIRECT 89.1 06/29/2013   LDLCALC 39 01/29/2022   ALT 12 05/03/2022    AST 21 05/03/2022   NA 137 05/03/2022   K 4.4 05/03/2022   CL 104 05/03/2022   CREATININE 1.03 05/03/2022   BUN 21 05/03/2022   CO2 25 05/03/2022   TSH 2.11 04/29/2015   INR 1.1 12/02/2019   HGBA1C 5.6 07/04/2014    CT FEMUR RIGHT WO CONTRAST  Result Date: 04/25/2022 CLINICAL DATA:  Soft tissue mass, deep thigh. Chronic falls over the past 10 days. EXAM: CT OF THE LOWER RIGHT EXTREMITY WITHOUT CONTRAST TECHNIQUE: Multidetector CT imaging of the right lower extremity was performed according to the standard protocol. RADIATION DOSE REDUCTION: This exam was performed according to the departmental dose-optimization program which includes automated exposure control, adjustment of the mA and/or kV according to patient size and/or use of iterative reconstruction technique. COMPARISON:  None Available. FINDINGS: Bones/Joint/Cartilage No acute fracture or dislocation. Joint space narrowing and subchondral sclerosis is present in the medial and lateral compartments. No significant joint effusion. Ligaments Suboptimally assessed by CT. Muscles and Tendons No intramuscular hematoma. Soft tissues Vascular calcifications are noted in the soft tissues. There is subcutaneous fat stranding along the lateral aspect of the hip and thigh. Hyperdense  collections are noted lateral to the right hip in the subcutaneous soft tissues, the largest measuring 15.0 x 7.1 x 8.0 cm. IMPRESSION: 1. No acute fracture or dislocation. 2. Subcutaneous fat stranding over the lateral aspect of the right hip and thigh compatible with contusion. There are hyperdense collections lateral to the right hip, the largest measuring 15.0 x 7.1 x 8.0 cm suggesting hematoma given history of falls. Electronically Signed   By: Brett Fairy M.D.   On: 04/25/2022 02:28   MR BRAIN WO CONTRAST  Result Date: 04/25/2022 CLINICAL DATA:  Frequent falls, dizziness EXAM: MRI HEAD WITHOUT CONTRAST TECHNIQUE: Multiplanar, multiecho pulse sequences of the brain  and surrounding structures were obtained without intravenous contrast. COMPARISON:  No prior MRI, correlation is made with CTA head neck 04/24/2022 FINDINGS: Brain: No restricted diffusion to suggest acute or subacute infarct. No acute hemorrhage, mass, mass effect, or midline shift. No hydrocephalus or extra-axial collection. No hemosiderin deposition to suggest remote hemorrhage. Scattered T2 hyperintense signal in the periventricular white matter and pons, likely the sequela of mild-to-moderate chronic small vessel ischemic disease. Remote small bilateral parietal cortical infarcts. Remote lacunar infarcts in the left corona radiata, left cerebellum, and basal ganglia. Vascular: Patent arterial flow voids. Skull and upper cervical spine: Normal marrow signal. Mild anterolisthesis of C3 on C4. Sinuses/Orbits: Mild mucosal thickening in the ethmoid air cells. Status post bilateral lens replacements. Other: The mastoids are well aerated. IMPRESSION: No acute intracranial process. No evidence of acute or subacute infarct. Electronically Signed   By: Merilyn Baba M.D.   On: 04/25/2022 01:45   CT ANGIO HEAD NECK W WO CM  Result Date: 04/24/2022 CLINICAL DATA:  Chronic falls; weakness and dizziness EXAM: CT ANGIOGRAPHY HEAD AND NECK TECHNIQUE: Multidetector CT imaging of the head and neck was performed using the standard protocol during bolus administration of intravenous contrast. Multiplanar CT image reconstructions and MIPs were obtained to evaluate the vascular anatomy. Carotid stenosis measurements (when applicable) are obtained utilizing NASCET criteria, using the distal internal carotid diameter as the denominator. RADIATION DOSE REDUCTION: This exam was performed according to the departmental dose-optimization program which includes automated exposure control, adjustment of the mA and/or kV according to patient size and/or use of iterative reconstruction technique. CONTRAST:  45m OMNIPAQUE IOHEXOL 350  MG/ML SOLN COMPARISON:  11/19/2021 CT head no prior CTA FINDINGS: CT HEAD FINDINGS Brain: No evidence of acute infarct, hemorrhage, mass, mass effect, or midline shift. No hydrocephalus or extra-axial fluid collection. 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 Vascular: No hyperdense vessel. Skull: Normal. Negative for fracture or focal lesion. Sinuses/Orbits: Mild mucosal thickening in the anterior ethmoid air cells and left frontal sinus Other: The mastoid air cells are well aerated. CTA NECK FINDINGS Aortic arch: Standard branching. Imaged portion shows no evidence of aneurysm or dissection. No hemodynamically significant stenosis of the major arch vessel origins. Approximate 30% luminal narrowing of the left subclavian artery (series 14, image 113). Right carotid system: No evidence of dissection, occlusion, or hemodynamically significant stenosis (greater than 50%). In the distal right internal carotid artery, there is an area of mild dilatation, measuring up to 6 mm (series 12, image 143), compared to 3 mm in the more proximal ICA, returning closer to the normal caliber by the petrous segment. Left carotid system: No evidence of dissection, occlusion, or hemodynamically significant stenosis (greater than 50%). Vertebral arteries: No evidence of dissection, occlusion, or hemodynamically significant stenosis (greater than 50%).  Skeleton: Degenerative changes in the cervical spine, with reversal of the normal cervical lordosis and trace anterolisthesis of C3 on C4. Trace retrolisthesis of C5 on C6. No acute osseous abnormality. Other neck: Negative. Upper chest: No focal pulmonary opacity or pleural effusion. Review of the MIP images confirms the above findings CTA HEAD FINDINGS Anterior circulation: Both internal carotid arteries are patent to the termini, with mild-to-moderate stenosis in the left supraclinoid ICA. A1 segments patent. Normal anterior  communicating artery. Anterior cerebral arteries are patent to their distal aspects. No M1 stenosis or occlusion. MCA branches perfused and symmetric. Posterior circulation: Vertebral arteries patent to the vertebrobasilar junction without stenosis. Mild dilatation of the mid left V4, which measures 3 mm in the proximal left V4, enlarges to up to 4 mm, and then narrows to 2 mm just proximal to the vertebrobasilar junction. Basilar patent to its distal aspect. Superior cerebellar arteries patent proximally. Patent P1 segments. PCAs perfused to their distal aspects without stenosis. The bilateral posterior communicating arteries are not visualized. Venous sinuses: As permitted by contrast timing, patent. Anatomic variants: None significant. Review of the MIP images confirms the above findings IMPRESSION: 1. No acute intracranial process. 2. No intracranial large vessel occlusion. Mild-to-moderate stenosis in the left supraclinoid ICA. 3. No hemodynamically significant stenosis in the neck. 4. 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 Electronically Signed   By: Merilyn Baba M.D.   On: 04/24/2022 22:46   CT C-SPINE NO CHARGE  Result Date: 04/24/2022 CLINICAL DATA:  Fall. EXAM: CT CERVICAL SPINE WITHOUT CONTRAST TECHNIQUE: Multidetector CT imaging of the cervical spine was performed without intravenous contrast. Multiplanar CT image reconstructions were also generated. RADIATION DOSE REDUCTION: This exam was performed according to the departmental dose-optimization program which includes automated exposure control, adjustment of the mA and/or kV according to patient size and/or use of iterative reconstruction technique. COMPARISON:  Cervical spine CT 08/25/2019 FINDINGS: Alignment: There is 2 mm of anterolisthesis at C3-C4 which is unchanged and favored is degenerative. Alignment is otherwise anatomic. Skull base and vertebrae: No acute  fracture. No primary bone lesion or focal pathologic process. Bones are osteopenic. Soft tissues and spinal canal: No prevertebral fluid or swelling. No visible canal hematoma. Disc levels: Disc space narrowing and endplate osteophyte formation is seen throughout the cervical spine from C4 through T1 similar to the prior study. There is no severe central canal or neural foraminal stenosis at any level. Upper chest: Negative. Other: None. IMPRESSION: No acute fracture or traumatic subluxation of the cervical spine. Electronically Signed   By: Ronney Asters M.D.   On: 04/24/2022 21:45    Assessment & Plan:   Problem List Items Addressed This Visit     Anemia, iron deficiency   Relevant Orders   Comprehensive metabolic panel (Completed)   CBC with Differential/Platelet (Completed)   Essential hypertension    We will reduce losartan metoprolol due to lightheadedness and low blood pressure      Relevant Medications   metoprolol succinate (TOPROL-XL) 25 MG 24 hr tablet   losartan (COZAAR) 25 MG tablet   Falls frequently    Status post recent fall.  Large hematoma of the right buttock down the right thigh. Use a walker they will get a rollator walker. Falls prevention.  No alcohol at all was ordered.  Will reduce metoprolol, losartan, duloxetine dosages-see orders. Count to 10 before 6 to walk from sitting position-proceed if not lightheaded. Drink  water Physical therapy       Hematoma of right thigh    Status post fall.  Large hematoma of the right buttock down the right thigh.  Arnica cream.  Gentle heat.  Use a walker they will get a rollator walker. Falls prevention      Weakness - Primary   Relevant Orders   Comprehensive metabolic panel (Completed)   CBC with Differential/Platelet (Completed)      Meds ordered this encounter  Medications   metoprolol succinate (TOPROL-XL) 25 MG 24 hr tablet    Sig: Take 0.5 tablets (12.5 mg total) by mouth daily.    Dispense:  90 tablet     Refill:  3   losartan (COZAAR) 25 MG tablet    Sig: Take 0.5 tablets (12.5 mg total) by mouth daily.    Dispense:  90 tablet    Refill:  3   DULoxetine (CYMBALTA) 20 MG capsule    Sig: Take 40 mg x 1 week, then 20 mg/day    Dispense:  90 capsule    Refill:  3      Follow-up: Return in about 6 weeks (around 06/14/2022) for a follow-up visit.  Walker Kehr, MD

## 2022-05-06 ENCOUNTER — Other Ambulatory Visit: Payer: Self-pay | Admitting: Internal Medicine

## 2022-05-06 NOTE — Assessment & Plan Note (Signed)
Status post fall.  Large hematoma of the right buttock down the right thigh.  Arnica cream.  Gentle heat.  Use a walker they will get a rollator walker. Falls prevention

## 2022-05-06 NOTE — Assessment & Plan Note (Signed)
We will reduce losartan metoprolol due to lightheadedness and low blood pressure

## 2022-05-06 NOTE — Assessment & Plan Note (Signed)
Status post recent fall.  Large hematoma of the right buttock down the right thigh. Use a walker they will get a rollator walker. Falls prevention.  No alcohol at all was ordered.  Will reduce metoprolol, losartan, duloxetine dosages-see orders. Count to 10 before 6 to walk from sitting position-proceed if not lightheaded. Drink water Physical therapy

## 2022-05-07 NOTE — Telephone Encounter (Signed)
Check [place] registry last filled 11/26/2021.Marland KitchenJohny Chess

## 2022-05-09 ENCOUNTER — Ambulatory Visit: Payer: Medicare Other | Admitting: Physical Therapy

## 2022-05-09 ENCOUNTER — Encounter: Payer: Self-pay | Admitting: Internal Medicine

## 2022-05-11 ENCOUNTER — Ambulatory Visit: Payer: Medicare Other | Admitting: Internal Medicine

## 2022-05-15 ENCOUNTER — Ambulatory Visit: Payer: Medicare Other | Admitting: Obstetrics and Gynecology

## 2022-05-15 ENCOUNTER — Other Ambulatory Visit (HOSPITAL_COMMUNITY)
Admission: RE | Admit: 2022-05-15 | Discharge: 2022-05-15 | Disposition: A | Discharge status: Home / Self Care | Payer: Medicare Other | Source: Ambulatory Visit | Attending: Obstetrics and Gynecology | Admitting: Obstetrics and Gynecology

## 2022-05-15 ENCOUNTER — Encounter: Payer: Self-pay | Admitting: Obstetrics and Gynecology

## 2022-05-15 VITALS — BP 120/74 | HR 76 | Ht 62.75 in | Wt 143.0 lb

## 2022-05-15 DIAGNOSIS — Z87412 Personal history of vulvar dysplasia: Secondary | ICD-10-CM | POA: Diagnosis not present

## 2022-05-15 DIAGNOSIS — N9089 Other specified noninflammatory disorders of vulva and perineum: Secondary | ICD-10-CM | POA: Insufficient documentation

## 2022-05-15 DIAGNOSIS — N763 Subacute and chronic vulvitis: Secondary | ICD-10-CM

## 2022-05-15 NOTE — Progress Notes (Addendum)
GYNECOLOGY  VISIT   HPI: 87 y.o.   Married  Caucasian  female   G2P2001 with No LMP recorded.   here for left vulvar irritation. Same irritation she has had previously.   Patient has been seeing Dr. Daryll Drown at [facility].  She is stopped using fluocinonide (Lidex) 0.05% and betamethasone ointment to the vulva.  Using aquafor only.  She has developed an ulcerative area of the vulva.  Recovering from a fall.  She has a large hematoma of the right hip.  GYNECOLOGIC HISTORY: No LMP recorded. Contraception:  Hyst Menopausal hormone therapy:  None Last mammogram:  02-08-22 Diag.Rt./Neg/BiRads1. 01-29-22 Rt.Br.poss.asymmetry;Lt.Br.Neg. Last pap smear:  Years ago        OB History     Gravida  2   Para  2   Term  2   Preterm      AB      Living  1      SAB      IAB      Ectopic      Multiple      Live Births                 Patient Active Problem List   Diagnosis Date Noted   Hematoma of right thigh 04/25/2022   Weakness 04/25/2022   Weakness generalized 04/25/2022   Falls frequently 04/19/2022   Iron deficiency anemia 12/08/2021   Herpesviral infection, unspecified 11/22/2021   Skin ulcer, limited to breakdown of skin ([place]) 11/22/2021   Anemia, iron deficiency 11/22/2021   Low serum vitamin B12 11/22/2021   Acute renal failure superimposed on stage 2 chronic kidney disease ([place]) 11/20/2021   ABLA (acute blood loss anemia) 11/20/2021   UTI (urinary tract infection) 11/20/2021   Acute diverticulitis 11/20/2021   Hypotension 11/19/2021   Shortness of breath 06/12/2021   Right leg pain 02/21/2021   Diverticulitis 01/08/2021   Acute respiratory failure with hypoxia (HCC)    Bacterial colitis    Abnormal CT of the abdomen    Diarrhea of infectious origin    Acute colitis 08/27/2020   Aortic atherosclerosis ([place]) 08/27/2020   Acute prerenal azotemia 08/27/2020   Shock circulatory ([place]) 14/48/1856   Metabolic acidosis with normal anion gap and bicarbonate  losses 08/27/2020   Dehydration    Squamous cell cancer of skin of left cheek 31/49/7026   Lichen sclerosus 37/85/8850   Abdominal cramping 03/16/2020   Neurogenic orthostatic hypotension ([place]) 09/16/2019   Injury of face 08/31/2019   Leg abrasion, infected, right, subsequent encounter 08/28/2019   Face lacerations 08/26/2019   Nose fracture 08/26/2019   Concussion 08/26/2019   Wrist pain, acute, right 08/26/2019   Greater trochanteric pain syndrome of right lower extremity 07/30/2019   Viral URI with cough 08/15/2018   Abdominal pain 05/22/2018   Traumatic hematoma of right knee 01/10/2018   Ulcer of right knee (HCC)    Bradycardia with 41-50 beats per minute 12/27/2017   Right bundle branch block (RBBB) on electrocardiogram (ECG) 12/27/2017   Quadriceps tendon rupture 12/24/2017   Chronic kidney disease, stage 3b ([place]) 12/24/2017   Cellulitis of leg, right with large prepatella hematoma and open wounds 12/24/2017   Iliotibial band syndrome of right side 12/16/2017   Depression 07/11/2017   Intervertebral lumbar disc disorder with myelopathy, lumbar region 04/25/2017   Family history of colon cancer in mother 01/22/2017   Primary osteoarthritis of both first carpometacarpal joints 01/02/2017   Pain 11/21/2016   Gastroenteritis 09/13/2016  Chronic anticoagulation 05/24/2015   Coronary artery disease involving native coronary artery of native heart with angina pectoris ([place]) 12/20/2014   Iliotibial band syndrome of left side 11/16/2014   Diverticulitis of colon 08/30/2014   Chest pain, atypical 07/03/2014   Diarrhea 12/22/2013   Permanent atrial fibrillation ([place]) 11/21/2012   Greater trochanteric bursitis of both hips 05/21/2012   Dizzinesses 11/07/2011   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   Osteoarthritis of left hip 07/03/2010   DEGENERATIVE [place] DISEASE, LUMBOSACRAL SPINE 05/18/2010   SYNCOPE 10/27/2008   Essential hypertension 06/16/2007   GERD (gastroesophageal  reflux disease) 06/16/2007   COLONIC POLYPS, HX OF 06/16/2007    Past Medical History:  Diagnosis Date   Acute blood loss anemia 12/25/2017   Allergic rhinitis    Anxiety    Arthritis    "my whole spine" (07/01/2017)   Atrial fibrillation (HCC)    Bowel obstruction (HCC)    in [place]   Bradycardia with 41-50 beats per minute 12/27/2017   Cancer ([place])    Cellulitis of leg, right with large prepatella hematoma and open wounds 12/26/2017   CKD (chronic kidney disease) stage 2, GFR 60-89 ml/min    Colon polyps    Coronary artery disease    10/18 PCI/DES to p/m LCx with cutting balloon to mLcx   Diverticulosis of colon    GERD (gastroesophageal reflux disease)    Hip bursitis 2010   Dr Para March, Post op seroma   History of colon polyps    HSV (herpes simplex virus) anogenital infection 07/2019   HTN (hypertension)    IBS (irritable bowel syndrome)    constipation predominant - Dr Earlean Shawl   Lichen sclerosus    Osteopenia 11/2016   T score -2.0 FRAX 15%/4.3%   PAC (premature atrial contraction)    Symptomatiic   Renal insufficiency    Right bundle branch block (RBBB) on electrocardiogram (ECG) 12/27/2017   Scoliosis    SVT (supraventricular tachycardia) ([place])    brief history   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   biopsy showing vulvar atypia, possible VIN I    Past Surgical History:  Procedure Laterality Date   ANTERIOR AND POSTERIOR VAGINAL REPAIR  01/2002   Archie Endo 01/23/2011   APPENDECTOMY  1946   CARDIAC CATHETERIZATION  06/26/2017   CORONARY ANGIOPLASTY WITH STENT PLACEMENT  07/01/2017   CORONARY ATHERECTOMY N/A 07/01/2017   Procedure: CORONARY ATHERECTOMY;  Surgeon: Belva Crome, MD;  Location: [facility];  Service: Cardiovascular;  Laterality: N/A;   CORONARY STENT INTERVENTION N/A 07/01/2017   Procedure: CORONARY STENT INTERVENTION;  Surgeon: Belva Crome, MD;  Location: [facility];  Service: Cardiovascular;  Laterality: N/A;    HAMMER TOE SURGERY     HEMORRHOID BANDING     HIP SURGERY Left 04/2009   hip examination under anesthesia followed by greater trochanteric bursectomy; iliotibial band tenotomy/notes 01/20/2011   I & D EXTREMITY Right 01/10/2018   Procedure: IRRIGATION AND DEBRIDEMENT RIGHT KNEE, APPLY WOUND VAC;  Surgeon: Newt Minion, MD;  Location: [facility];  Service: Orthopedics;  Laterality: Right;   KNEE BURSECTOMY Right 04/2009   Archie Endo 01/09/2011   LEFT ATRIAL APPENDAGE OCCLUSION N/A 08/24/2021   Procedure: LEFT ATRIAL APPENDAGE OCCLUSION;  Surgeon: Sherren Mocha, MD;  Location: [facility];  Service: Cardiovascular;  Laterality: N/A;   LEFT HEART CATH AND CORONARY ANGIOGRAPHY N/A 06/26/2017   Procedure: LEFT HEART CATH AND CORONARY ANGIOGRAPHY;  Surgeon: Belva Crome, MD;  Location: [facility];  Service: Cardiovascular;  Laterality: N/A;   PUBOVAGINAL SLING  01/2002   Archie Endo 01/23/2011   REDUCTION MAMMAPLASTY     TEE WITHOUT CARDIOVERSION N/A 08/24/2021   Procedure: TRANSESOPHAGEAL ECHOCARDIOGRAM (TEE);  Surgeon: Sherren Mocha, MD;  Location: [facility];  Service: Cardiovascular;  Laterality: N/A;   TEMPORARY PACEMAKER N/A 07/01/2017   Procedure: TEMPORARY PACEMAKER;  Surgeon: Belva Crome, MD;  Location: [facility];  Service: Cardiovascular;  Laterality: N/A;   VAGINAL HYSTERECTOMY  01/2002   Vaginal hysterectomy, bilateral salpingo-oophorectomy/notes 01/23/2011    Current Outpatient Medications  Medication Sig Dispense Refill   acetaminophen (TYLENOL) 500 MG tablet Take 1,000 mg by mouth every 6 (six) hours as needed (body aches / sore hip). Maximum of 6 tablets daily ('3000mg'$ )     apixaban (ELIQUIS) 2.5 MG TABS tablet TAKE 1 TABLET BY MOUTH TWICE  DAILY 200 tablet 1   atorvastatin (LIPITOR) 40 MG tablet Take 1 tablet (40 mg total) by mouth every Monday, Wednesday, and Friday. 45 tablet 3   BIOTIN PO Take 5,000 mcg by mouth in the morning.     Cholecalciferol  (VITAMIN D3) 50 MCG (2000 UT) capsule Take 1 capsule (2,000 Units total) by mouth daily. 100 capsule 3   Cyanocobalamin (VITAMIN B-12) 1000 MCG SUBL Place 1 tablet (1,000 mcg total) under the tongue daily. 100 tablet 3   diclofenac Sodium (VOLTAREN) 1 % GEL Apply 2 g topically 4 (four) times daily as needed (back pain.).     dicyclomine (BENTYL) 20 MG tablet Take 1 by mouth every 4-6 hours as needed for cramping 30 tablet 3   DULoxetine (CYMBALTA) 20 MG capsule Take 40 mg x 1 week, then 20 mg/day 90 capsule 3   empagliflozin (JARDIANCE) 10 MG TABS tablet Take 1 tablet (10 mg total) by mouth daily before breakfast. 90 tablet 2   esomeprazole (NEXIUM) 40 MG capsule TAKE ONE CAPSULE ONCE DAILY. (Patient taking differently: Take 40 mg by mouth daily.) 90 capsule 1   Homeopathic Products (ARNICARE) GEL Apply 1 application topically daily as needed (skin bruising).     hyoscyamine (LEVSIN) 0.125 MG tablet TAKE 1-2 TABLETS (0.125-0.25 MG) BY MOUTH EVERY 4 HOURS AS NEEDED FOR UP TO 10 DAYS FOR CRAMPING. (Patient taking differently: Take 0.125-0.25 mg by mouth every 4 (four) hours as needed for cramping.) 100 tablet 1   LORazepam (ATIVAN) 1 MG tablet TAKE ONE TABLET BY MOUTH TWICE DAILY AS NEEDED FOR ANXIETY / SLEEP. 180 tablet 1   losartan (COZAAR) 25 MG tablet Take 0.5 tablets (12.5 mg total) by mouth daily. 90 tablet 3   metoprolol succinate (TOPROL-XL) 25 MG 24 hr tablet Take 0.5 tablets (12.5 mg total) by mouth daily. 90 tablet 3   Multiple Vitamins-Minerals (PRESERVISION AREDS 2+MULTI VIT) CAPS Take 1 capsule by mouth daily.     nitroGLYCERIN (NITROSTAT) 0.4 MG SL tablet Place 0.4 mg under the tongue every 5 (five) minutes as needed for chest pain (Call 911 at 3rd dose within 15 minutes.).     ondansetron (ZOFRAN-ODT) 8 MG disintegrating tablet Take 1 tablet (8 mg total) by mouth every 8 (eight) hours as needed for nausea or vomiting. 12 tablet 0   Polyethyl Glycol-Propyl Glycol (LUBRICANT EYE DROPS)  0.4-0.3 % SOLN Place 1-2 drops into both eyes at bedtime.     polyethylene glycol (MIRALAX / GLYCOLAX) 17 g packet Take 17 g by mouth daily as needed for mild constipation.  saccharomyces boulardii (FLORASTOR) 250 MG capsule Take 1 capsule (250 mg total) by mouth 2 (two) times daily. (Patient taking differently: Take 250 mg by mouth daily.) 60 capsule 2   sodium chloride (OCEAN) 0.65 % SOLN nasal spray Place 1 spray into both nostrils at bedtime as needed for congestion.     spironolactone (ALDACTONE) 25 MG tablet Take 0.5 tablets (12.5 mg total) by mouth daily. (Patient taking differently: Take 12.5 mg by mouth every Monday, Wednesday, and Friday.) 45 tablet 3   No current facility-administered medications for this visit.     ALLERGIES: Macrobid [nitrofurantoin monohyd macro], Meloxicam, Digoxin and related, Diprolene [betamethasone dipropionate aug], Ferrous sulfate, Keflex [cephalexin], Mobic [meloxicam], and Sulfamethoxazole-trimethoprim  Family History  Problem Relation Age of Onset   Colon cancer Mother        Dx age 50, died at age 17   Diabetes Father    Prostate cancer Father    Prostate cancer Brother    Pancreatic cancer Brother    Stomach cancer Son    Heart attack Neg Hx    Stroke Neg Hx    Esophageal cancer Neg Hx     Social History   Socioeconomic History   Marital status: Married    Spouse name: [name]   Number of children: 2   Years of education: Not on file   Highest education level: Not on file  Occupational History   Occupation: Patent attorney: RETIRED  Tobacco Use   Smoking status: Former    Packs/day: 0.25    Years: 28.00    Total pack years: 7.00    Types: Cigarettes    Quit date: 1981    Years since quitting: 42.7   Smokeless tobacco: Never  Vaping Use   Vaping Use: Never used  Substance and Sexual Activity   Alcohol use: Yes    Comment: 7 vodka drinks a week   Drug use: Never   Sexual activity: Not Currently    Birth  control/protection: Surgical    Comment: hysterectomy  Other Topics Concern   Not on file  Social History Narrative   Regular Exercise -  YES         Social Determinants of Health   Financial Resource Strain: Low Risk  (02/14/2022)   Overall Financial Resource Strain (CARDIA)    Difficulty of Paying Living Expenses: Not hard at all  Food Insecurity: No Food Insecurity (02/14/2022)   Hunger Vital Sign    Worried About Running Out of Food in the Last Year: Never true    Ran Out of Food in the Last Year: Never true  Transportation Needs: No Transportation Needs (02/14/2022)   PRAPARE - Hydrologist (Medical): No    Lack of Transportation (Non-Medical): No  Physical Activity: Inactive (02/14/2022)   Exercise Vital Sign    Days of Exercise per Week: 0 days    Minutes of Exercise per Session: 0 min  Stress: No Stress Concern Present (02/14/2022)   [place]    Feeling of Stress : Only a little  Social Connections: Moderately Integrated (02/14/2022)   Social Connection and Isolation Panel [NHANES]    Frequency of Communication with Friends and Family: More than three times a week    Frequency of Social Gatherings with Friends and Family: Three times a week    Attends Religious Services: More than 4 times per year    Active Member of  Clubs or Organizations: No    Attends Archivist Meetings: Never    Marital Status: Married  Human resources officer Violence: Not At Risk (02/14/2022)   Humiliation, Afraid, Rape, and Kick questionnaire    Fear of Current or Ex-Partner: No    Emotionally Abused: No    Physically Abused: No    Sexually Abused: No    Review of Systems  Genitourinary:        Vulvar irritation  All other systems reviewed and are negative.   PHYSICAL EXAMINATION:    BP 120/74   Pulse 76   Ht 5' 2.75" (1.594 m)   Wt 143 lb (64.9 kg)   BMI 25.53 kg/m     General appearance:  alert, cooperative and appears stated age   Right hip -  10 cm hematoma noted.   Pelvic: External genitalia:  left labia minora with medial surface with 5 mm ulcerative area and lateral surface with 2 mm red raised lesion.              Urethra:  normal appearing urethra with no masses, tenderness or lesions              Bartholins and Skenes: normal                 Vagina: normal appearing vagina with normal color and discharge, no lesions              Cervix: no lesions                  Vulvar biopsies - LEFT LABIA MINORA Consent for procedure.  Sterile prep with betadine.  Local 1% lidocaine, lot [id number], exp 09/11/23. 3 mm punch biopsy of medial labia minor and lateral labia minora, each to pathology separately.  AgNO3 applied.  3/0 single suture of the medial biopsy site.  Minimal EBL.  No complications.   Chaperone was present for exam:  Estill Bamberg, CMA  ASSESSMENT  Chronic vulvitis.  Hx VIN I.  CAD.  Atrial fibrillation.  On Eliquis.   PLAN  Fu biopsy results.  I did mention possible excision of the area surgically.  Final plan to follow.    An After Visit Summary was printed and given to the patient.  10 min  total time was spent for this patient encounter, including preparation, face-to-face counseling with the patient, coordination of care, and documentation of the encounter.

## 2022-05-15 NOTE — Patient Instructions (Signed)
Vulva Biopsy, Care After The following information offers guidance on how to care for yourself after your procedure. Your health care provider may also give you more specific instructions. If you have problems or questions, contact your health care provider. What can I expect after the procedure? After the procedure, it is common to have: Slight bleeding from the biopsy site. Slight pain or discomfort at the biopsy site. Follow these instructions at home: Biopsy site care  Follow instructions from your health care provider about how to take care of your biopsy site. Make sure you: Clean the area using water and mild soap twice a day or as told by your health care provider. Gently pat the area dry. You may shower 24 hours after the procedure. If you were prescribed an antibiotic ointment, apply it as told by your health care provider. Do not stop using the antibiotic even if your condition improves. If told by your health care provider, take a sitz bath to help with pain and discomfort. This is a warm water bath that you take while sitting down. Do this as often as told by your health care provider. The water should only come up to your hips and cover your buttocks. You may pat the area dry with a soft, clean towel. Leave stitches (sutures), skin glue, or adhesive strips in place. These skin closures may need to stay in place for 2 weeks or longer. If adhesive strip edges start to loosen and curl up, you may trim the loose edges. Do not remove adhesive strips completely unless your health care provider tells you to do that. Check your biopsy site every day for signs of infection. It may be helpful to use a handheld mirror to do this. Check for: Redness, swelling, or more pain. More fluid or blood. Warmth. Pus or a bad smell. Do not rub the biopsy area after urinating. Gently pat the area dry or use a bottle filled with warm water (peri bottle) to clean the area. Gently wipe from front to  back. Lifestyle Wear loose, cotton underwear. Do not wear tight pants. For at least 1 week or until your health care provider approves: Do not use tampons, douche, or put anything inside your vagina. Do not have sex. Until your health care provider approves: Do not exercise, such as running or biking. Do not swim or use a hot tub. General instructions Take over-the-counter and prescription medicines only as told by your health care provider. Drink enough fluid to keep your urine pale yellow. Use a sanitary napkin until the bleeding stops. If told, put ice on the biopsy site. To do this: Place ice in a plastic bag. Place a towel between your skin and the bag. Leave the ice on for 20 minutes, 2-3 times a day. Remove the ice if your skin turns bright red. This is very important. If you cannot feel pain, heat, or cold, you have a greater risk of damage to the area. Keep all follow-up visits. This is important. Contact a health care provider if: You have redness, swelling, or more pain around your biopsy site. You have more fluid or blood coming from your biopsy site. Your biopsy site feels warm to the touch. Your pain is not controlled with medicine or ice packs. You have a fever or chills. Get help right away if: You have heavy bleeding from the vulva. You have pus or a bad smell coming from the biopsy site. You have abdominal pain. Summary After the procedure, it  is common to have slight bleeding and discomfort at the biopsy site. Follow instructions from your health care provider after your biopsy. Take sitz baths as told by your health care provider to help with pain and discomfort. Leave any sutures in place. Contact your health care provider if you notice any signs of infection around the biopsy site, including redness, swelling, more pain, more fluid or blood, or warmth. Keep all follow-up visits. This is important. This information is not intended to replace advice given to you  by your health care provider. Make sure you discuss any questions you have with your health care provider. Document Revised: 05/16/2021 Document Reviewed: 05/16/2021 Elsevier Patient Education  [place].

## 2022-05-16 ENCOUNTER — Ambulatory Visit: Payer: Medicare Other | Admitting: Internal Medicine

## 2022-05-16 NOTE — Therapy (Signed)
OUTPATIENT PHYSICAL THERAPY LOWER EXTREMITY EVALUATION   Patient Name: KELINA BEAUCHAMP MRN: [id number] DOB:1934/11/28, 87 y.o., female Today's Date: 05/17/2022   PT End of Session - 05/17/22 1134     Visit Number 1    Number of Visits --   1-2x/week   Date for PT Re-Evaluation 07/12/22    Authorization Type [employer] MCR - FOTO    Progress Note Due on Visit 10    PT Start Time 1000    PT Stop Time 1038    PT Time Calculation (min) 38 min             Past Medical History:  Diagnosis Date   Acute blood loss anemia 12/25/2017   Allergic rhinitis    Anxiety    Arthritis    "my whole spine" (07/01/2017)   Atrial fibrillation (HCC)    Bowel obstruction (HCC)    in [place]   Bradycardia with 41-50 beats per minute 12/27/2017   Cancer ([place])    Cellulitis of leg, right with large prepatella hematoma and open wounds 12/26/2017   CKD (chronic kidney disease) stage 2, GFR 60-89 ml/min    Colon polyps    Coronary artery disease    10/18 PCI/DES to p/m LCx with cutting balloon to mLcx   Diverticulosis of colon    GERD (gastroesophageal reflux disease)    Hip bursitis 2010   Dr Para March, Post op seroma   History of colon polyps    HSV (herpes simplex virus) anogenital infection 07/2019   HTN (hypertension)    IBS (irritable bowel syndrome)    constipation predominant - Dr Earlean Shawl   Lichen sclerosus    Osteopenia 11/2016   T score -2.0 FRAX 15%/4.3%   PAC (premature atrial contraction)    Symptomatiic   Renal insufficiency    Right bundle branch block (RBBB) on electrocardiogram (ECG) 12/27/2017   Scoliosis    SVT (supraventricular tachycardia) ([place])    brief history   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   biopsy showing vulvar atypia, possible VIN I   Past Surgical History:  Procedure Laterality Date   ANTERIOR AND POSTERIOR VAGINAL REPAIR  01/2002   Archie Endo 01/23/2011   APPENDECTOMY  1948   CARDIAC CATHETERIZATION  06/26/2017   CORONARY ANGIOPLASTY WITH STENT  PLACEMENT  07/01/2017   CORONARY ATHERECTOMY N/A 07/01/2017   Procedure: CORONARY ATHERECTOMY;  Surgeon: Belva Crome, MD;  Location: [facility];  Service: Cardiovascular;  Laterality: N/A;   CORONARY STENT INTERVENTION N/A 07/01/2017   Procedure: CORONARY STENT INTERVENTION;  Surgeon: Belva Crome, MD;  Location: [facility];  Service: Cardiovascular;  Laterality: N/A;   HAMMER TOE SURGERY     HEMORRHOID BANDING     HIP SURGERY Left 04/2009   hip examination under anesthesia followed by greater trochanteric bursectomy; iliotibial band tenotomy/notes 01/20/2011   I & D EXTREMITY Right 01/10/2018   Procedure: IRRIGATION AND DEBRIDEMENT RIGHT KNEE, APPLY WOUND VAC;  Surgeon: Newt Minion, MD;  Location: [facility];  Service: Orthopedics;  Laterality: Right;   KNEE BURSECTOMY Right 04/2009   Archie Endo 01/09/2011   LEFT ATRIAL APPENDAGE OCCLUSION N/A 08/24/2021   Procedure: LEFT ATRIAL APPENDAGE OCCLUSION;  Surgeon: Sherren Mocha, MD;  Location: [facility];  Service: Cardiovascular;  Laterality: N/A;   LEFT HEART CATH AND CORONARY ANGIOGRAPHY N/A 06/26/2017   Procedure: LEFT HEART CATH AND CORONARY ANGIOGRAPHY;  Surgeon: Belva Crome, MD;  Location: [facility];  Service: Cardiovascular;  Laterality: N/A;   PUBOVAGINAL SLING  01/2002   Archie Endo 01/23/2011   REDUCTION MAMMAPLASTY     TEE WITHOUT CARDIOVERSION N/A 08/24/2021   Procedure: TRANSESOPHAGEAL ECHOCARDIOGRAM (TEE);  Surgeon: Sherren Mocha, MD;  Location: [facility];  Service: Cardiovascular;  Laterality: N/A;   TEMPORARY PACEMAKER N/A 07/01/2017   Procedure: TEMPORARY PACEMAKER;  Surgeon: Belva Crome, MD;  Location: [facility];  Service: Cardiovascular;  Laterality: N/A;   VAGINAL HYSTERECTOMY  01/2002   Vaginal hysterectomy, bilateral salpingo-oophorectomy/notes 01/23/2011   Patient Active Problem List   Diagnosis Date Noted   Hematoma of right thigh 04/25/2022   Weakness 04/25/2022    Weakness generalized 04/25/2022   Falls frequently 04/19/2022   Iron deficiency anemia 12/08/2021   Herpesviral infection, unspecified 11/22/2021   Skin ulcer, limited to breakdown of skin ([place]) 11/22/2021   Anemia, iron deficiency 11/22/2021   Low serum vitamin B12 11/22/2021   Acute renal failure superimposed on stage 2 chronic kidney disease ([place]) 11/20/2021   ABLA (acute blood loss anemia) 11/20/2021   UTI (urinary tract infection) 11/20/2021   Acute diverticulitis 11/20/2021   Hypotension 11/19/2021   Shortness of breath 06/12/2021   Right leg pain 02/21/2021   Diverticulitis 01/08/2021   Acute respiratory failure with hypoxia (HCC)    Bacterial colitis    Abnormal CT of the abdomen    Diarrhea of infectious origin    Acute colitis 08/27/2020   Aortic atherosclerosis ([place]) 08/27/2020   Acute prerenal azotemia 08/27/2020   Shock circulatory ([place]) 52/77/8242   Metabolic acidosis with normal anion gap and bicarbonate losses 08/27/2020   Dehydration    Squamous cell cancer of skin of left cheek 35/36/1443   Lichen sclerosus 15/40/0867   Abdominal cramping 03/16/2020   Neurogenic orthostatic hypotension ([place]) 09/16/2019   Injury of face 08/31/2019   Leg abrasion, infected, right, subsequent encounter 08/28/2019   Face lacerations 08/26/2019   Nose fracture 08/26/2019   Concussion 08/26/2019   Wrist pain, acute, right 08/26/2019   Greater trochanteric pain syndrome of right lower extremity 07/30/2019   Viral URI with cough 08/15/2018   Abdominal pain 05/22/2018   Traumatic hematoma of right knee 01/10/2018   Ulcer of right knee (HCC)    Bradycardia with 41-50 beats per minute 12/27/2017   Right bundle branch block (RBBB) on electrocardiogram (ECG) 12/27/2017   Quadriceps tendon rupture 12/24/2017   Chronic kidney disease, stage 3b ([place]) 12/24/2017   Cellulitis of leg, right with large prepatella hematoma and open wounds 12/24/2017   Iliotibial band syndrome of right side  12/16/2017   Depression 07/11/2017   Intervertebral lumbar disc disorder with myelopathy, lumbar region 04/25/2017   Family history of colon cancer in mother 01/22/2017   Primary osteoarthritis of both first carpometacarpal joints 01/02/2017   Pain 11/21/2016   Gastroenteritis 09/13/2016   Chronic anticoagulation 05/24/2015   Coronary artery disease involving native coronary artery of native heart with angina pectoris ([place]) 12/20/2014   Iliotibial band syndrome of left side 11/16/2014   Diverticulitis of colon 08/30/2014   Chest pain, atypical 07/03/2014   Diarrhea 12/22/2013   Permanent atrial fibrillation ([place]) 11/21/2012   Greater trochanteric bursitis of both hips 05/21/2012   Dizzinesses 11/07/2011   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   Osteoarthritis of left hip 07/03/2010   DEGENERATIVE DISC DISEASE, LUMBOSACRAL SPINE 05/18/2010   SYNCOPE 10/27/2008   Essential hypertension 06/16/2007   GERD (gastroesophageal reflux disease) 06/16/2007   COLONIC POLYPS, HX OF  06/16/2007    PCP: Cassandria Anger, MD  REFERRING PROVIDER: Stefanie Libel, MD  THERAPY DIAG:  Unsteadiness on feet - Plan: PT plan of care cert/re-cert  Muscle weakness - Plan: PT plan of care cert/re-cert  Other abnormalities of gait and mobility - Plan: PT plan of care cert/re-cert  REFERRING DIAG: Balance problem [R26.89], Fall on same level from slipping, tripping or stumbling, initial encounter [W01.0XXA]  Rationale for Evaluation and Treatment Rehabilitation  SUBJECTIVE:  PERTINENT PAST HISTORY:  Afib, bradycardia, hypotension      PRECAUTIONS: Fall  WEIGHT BEARING RESTRICTIONS No  FALLS:  Has patient fallen in last 6 months? Yes, Number of falls: 3 in 3 weeks over the last 6 months "trips"  MOI/History of condition:  Onset date: >3 years  SWAN FAIRFAX is a 87 y.o. female who presents to clinic with chief complaint of LOB d/t tripping.  Denies syncope. She has had chronic falls over  several years.  Seems to be getting worse over the last several months.  Last fall resulted in large hematoma on R thigh and she was hospitalized.   Red flags:  denies   Pain:  Are you having pain? Yes Pain location: Hematoma R thigh NPRS scale:  current 4/10  Stability: getting better   Occupation: retired  Administrator, sports: none, own Chief Executive Officer Dominance: none  Patient Goals/Specific Activities: improve balance   OBJECTIVE:   DIAGNOSTIC FINDINGS:  IMPRESSION: 1. No acute fracture or dislocation. 2. Subcutaneous fat stranding over the lateral aspect of the right hip and thigh compatible with contusion. There are hyperdense collections lateral to the right hip, the largest measuring 15.0 x 7.1 x 8.0 cm suggesting hematoma given history of falls.   GENERAL OBSERVATION/GAIT:   Arrives in manual WC, glute weakness compensation with posterior w/s in gait  SENSATION:  Light touch: Appears intact  PALPATION: TTP R lateral thigh and L knee  LE MMT:  MMT Right 05/17/2022 Left 05/17/2022  Hip flexion (L2, L3) 3+ 3+  Knee extension (L3) 3+ 3+  Knee flexion 3+ 3+  Hip abduction 3 3+  Hip extension    Hip external rotation    Hip internal rotation    Hip adduction    Ankle dorsiflexion (L4)    Ankle plantarflexion (S1)    Ankle inversion    Ankle eversion    Great Toe ext (L5)    Grossly     (Blank rows = not tested, score listed is out of 5 possible points.  N = WNL, D = diminished, C = clear for gross weakness with myotome testing, * = concordant pain with testing)  Functional Tests  Eval (05/17/2022)    BERG BALANCE TEST Sitting to Standing: 4.      Stands without using hands and stabilize independently Standing Unsupported: 4.      Stands safely for 2 minutes Sitting Unsupported: 4.     Sits for 2 minutes independently Standing to Sitting: 3.     Controls descent with hands  Transfers: 3.     Transfers safely definite use of hands Standing with  eyes closed: 4.     Stands safely for 10 seconds  Standing with feet together: 4.     Stands for 1 minute safely Reaching forward with outstretched arm: 3.     Reaches forward 5 inches Retrieving object from the floor: 3.     Able to pick up with supervision Turning to look behind: 3.  Looks behind one side only, other side less weight shift Turning 360 degrees: 2.     Able to turn slowly, but safely Place alternate foot on stool: 3.     Completes 8 steps in >20 seconds Standing with one foot in front: 4.     Independent tandem for 30 seconds  Standing on one foot: 0.     Unable  Total Score: 44/56     30'' STS: 9x  UE used? n     10 m max gait speed: 10'', 1 m/s, AD: n                                                   PATIENT SURVEYS:  FOTO 45 -> 52   TODAY'S TREATMENT: Creating, reviewing, and completing below HEP   PATIENT EDUCATION:  POC, diagnosis, prognosis, HEP, and outcome measures.  Pt educated via explanation, demonstration, and handout (HEP).  Pt confirms understanding verbally.   HOME EXERCISE PROGRAM: Access Code: [email or web address] URL: [email or web address] Date: 05/17/2022 Prepared by: Shearon Balo  Exercises - Bridge  - 1 x daily - 7 x weekly - 3 sets - 10 reps - 3'' hold - Hooklying Isometric Clamshell  - 1 x daily - 7 x weekly - 3 sets - 10 reps - Standing Tandem Balance with Counter Support  - 1 x daily - 7 x weekly - 3 sets - 24'' hold  ASTERISK SIGNS   Asterisk Signs Eval (05/17/2022)       BERG 44       SLS unable       LE MMT 3+ grossly                         ASSESSMENT:  CLINICAL IMPRESSION: Lachlyn is a 87 y.o. female who presents to clinic with signs and sxs consistent with balance deficits with concomitant LE/hip/core strength deficit.    OBJECTIVE IMPAIRMENTS: Pain, gait, balance, LE strength  ACTIVITY LIMITATIONS: transfers, housework, ambulation, bending, squatting  PERSONAL FACTORS: See medical history  and pertinent history   REHAB POTENTIAL: Good  CLINICAL DECISION MAKING: Stable/uncomplicated  EVALUATION COMPLEXITY: Low   GOALS:   SHORT TERM GOALS: Target date: 06/07/2022  Reka will be >75% HEP compliant to improve carryover between sessions and facilitate independent management of condition  Evaluation (05/17/2022): ongoing Goal status: INITIAL   LONG TERM GOALS: Target date: 07/12/2022  Psalm will improve FOTO score to 52 as a proxy for functional improvement  Evaluation/Baseline (05/17/2022): 45 Goal status: INITIAL   2.  Hye will improve 30'' STS (MCID 2) to >/= 11x (w/ UE?: n) to show improved LE strength and improved transfers   Evaluation/Baseline (05/17/2022): 9x  w/ UE? n Goal status: INITIAL   3.  Landy will improve 10 meter max gait speed to 1.2 m/s (.1 m/s MCID) to show functional improvement in ambulation   Evaluation/Baseline (05/17/2022): 1 m/s Goal status: INITIAL   [place]:     4.  Lauralei will imporve BERG balance score to 50 pts, to show a significant improvement in balance in order to reduce fall risk  Evaluation/Baseline (05/17/2022): 44 pts Goal status: INITIAL  See interpretation below:        PLAN: PT FREQUENCY: 1-2x/week  PT DURATION: 8 weeks (Ending 07/12/2022)  PLANNED INTERVENTIONS: Therapeutic exercises, Aquatic therapy, Therapeutic  activity, Neuro Muscular re-education, Gait training, Patient/Family education, Joint mobilization, Dry Needling, Electrical stimulation, Spinal mobilization and/or manipulation, Moist heat, Taping, Vasopneumatic device, Ionotophoresis '4mg'$ /ml Dexamethasone, and Manual therapy  PLAN FOR NEXT SESSION: progressive balance on unstable surfaces, SLS, with head turns, general LE strengthening, gait   Shearon Balo PT, DPT 05/17/2022, 11:36 AM

## 2022-05-17 ENCOUNTER — Other Ambulatory Visit: Payer: Self-pay

## 2022-05-17 ENCOUNTER — Telehealth: Payer: Self-pay | Admitting: Physical Medicine and Rehabilitation

## 2022-05-17 ENCOUNTER — Ambulatory Visit: Payer: Medicare Other | Attending: Sports Medicine | Admitting: Physical Therapy

## 2022-05-17 DIAGNOSIS — R2689 Other abnormalities of gait and mobility: Secondary | ICD-10-CM | POA: Diagnosis not present

## 2022-05-17 DIAGNOSIS — R2681 Unsteadiness on feet: Secondary | ICD-10-CM

## 2022-05-17 DIAGNOSIS — W010XXA Fall on same level from slipping, tripping and stumbling without subsequent striking against object, initial encounter: Secondary | ICD-10-CM | POA: Insufficient documentation

## 2022-05-17 DIAGNOSIS — M6281 Muscle weakness (generalized): Secondary | ICD-10-CM

## 2022-05-17 NOTE — Telephone Encounter (Signed)
Pt called to reschedule appt. Please call pt at [phone]

## 2022-05-21 ENCOUNTER — Encounter: Payer: Medicare Other | Admitting: Physical Medicine and Rehabilitation

## 2022-05-21 LAB — SURGICAL PATHOLOGY

## 2022-05-22 ENCOUNTER — Other Ambulatory Visit: Payer: Self-pay | Admitting: *Deleted

## 2022-05-22 MED ORDER — LIDOCAINE 5 % EX OINT: 1.0000 | TOPICAL_OINTMENT | CUTANEOUS | 0 refills | Status: DC | PRN

## 2022-05-25 ENCOUNTER — Other Ambulatory Visit: Payer: Self-pay

## 2022-05-25 DIAGNOSIS — D509 Iron deficiency anemia, unspecified: Secondary | ICD-10-CM

## 2022-05-27 NOTE — Progress Notes (Unsigned)
Cardiology Office Note:    Date:  05/28/2022   ID:  Hailey [name], DOB [birth year], MRN [id number]  PCP:  Cassandria Anger, MD  Cardiologist:  Sinclair Grooms, MD   Referring MD: Cassandria Anger, MD   Chief Complaint  Patient presents with   Atrial Fibrillation   Coronary Artery Disease   Congestive Heart Failure    Diastolic    History of Present Illness:    Hailey [name] is a 87 y.o. female with a hx of  HTN, permanent atrial fibrillation, aortic atherosclerosis, anemia, CAD s/p PCI DES of RCA 2018, and GERD.    She saw Dr. Quentin Ore on 08/01/2021 for consideration of Watchman (LAA occlusion device) and was felt to be a good candidate. She desired to discontinue anticoagulation due to frequent falls and skin tears. She was admitted 08/24/2021 for left atrial appendage occlusion device but unfortunately it was aborted due to the orientation of the appendage and she was discharged home the same day.  Bleeding on Eliquis.  Admitted with frequent falls and bleeding 04/24/2022.  She has had several falls since being last seen here.  States that Dr. Alain Marion felt that blood pressure was too low and made adjustments.  Feels well today.  The most recent fall led to a large right hip hematoma.  She had to be off anticoagulation for several days because of it.  Instances of falls and injury have increased over the last 2 years causing concern about safety of anticoagulation.  Past Medical History:  Diagnosis Date   Acute blood loss anemia 12/25/2017   Allergic rhinitis    Anxiety    Arthritis    "my whole spine" (07/01/2017)   Atrial fibrillation (HCC)    Bowel obstruction (HCC)    in [place]   Bradycardia with 41-50 beats per minute 12/27/2017   Cancer ([place])    Cellulitis of leg, right with large prepatella hematoma and open wounds 12/26/2017   CKD (chronic kidney disease) stage 2, GFR 60-89 ml/min    Colon polyps    Coronary artery disease    10/18 PCI/DES to  p/m LCx with cutting balloon to mLcx   Diverticulosis of colon    GERD (gastroesophageal reflux disease)    Hip bursitis 2010   Dr Para March, Post op seroma   History of colon polyps    HSV (herpes simplex virus) anogenital infection 07/2019   HTN (hypertension)    IBS (irritable bowel syndrome)    constipation predominant - Dr Earlean Shawl   Lichen sclerosus    Osteopenia 11/2016   T score -2.0 FRAX 15%/4.3%   PAC (premature atrial contraction)    Symptomatiic   Renal insufficiency    Right bundle branch block (RBBB) on electrocardiogram (ECG) 12/27/2017   Scoliosis    SVT (supraventricular tachycardia) ([place])    brief history   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   biopsy showing vulvar atypia, possible VIN I    Past Surgical History:  Procedure Laterality Date   ANTERIOR AND POSTERIOR VAGINAL REPAIR  01/2002   Archie Endo 01/23/2011   APPENDECTOMY  1948   CARDIAC CATHETERIZATION  06/26/2017   CORONARY ANGIOPLASTY WITH STENT PLACEMENT  07/01/2017   CORONARY ATHERECTOMY N/A 07/01/2017   Procedure: CORONARY ATHERECTOMY;  Surgeon: Belva Crome, MD;  Location: [facility];  Service: Cardiovascular;  Laterality: N/A;   CORONARY STENT INTERVENTION N/A 07/01/2017   Procedure: CORONARY STENT INTERVENTION;  Surgeon: Belva Crome, MD;  Location: [facility];  Service: Cardiovascular;  Laterality: N/A;   HAMMER TOE SURGERY     HEMORRHOID BANDING     HIP SURGERY Left 04/2009   hip examination under anesthesia followed by greater trochanteric bursectomy; iliotibial band tenotomy/notes 01/20/2011   I & D EXTREMITY Right 01/10/2018   Procedure: IRRIGATION AND DEBRIDEMENT RIGHT KNEE, APPLY WOUND VAC;  Surgeon: Newt Minion, MD;  Location: [facility];  Service: Orthopedics;  Laterality: Right;   KNEE BURSECTOMY Right 04/2009   Archie Endo 01/09/2011   LEFT ATRIAL APPENDAGE OCCLUSION N/A 08/24/2021   Procedure: LEFT ATRIAL APPENDAGE OCCLUSION;  Surgeon: Sherren Mocha, MD;  Location:  [facility];  Service: Cardiovascular;  Laterality: N/A;   LEFT HEART CATH AND CORONARY ANGIOGRAPHY N/A 06/26/2017   Procedure: LEFT HEART CATH AND CORONARY ANGIOGRAPHY;  Surgeon: Belva Crome, MD;  Location: [facility];  Service: Cardiovascular;  Laterality: N/A;   PUBOVAGINAL SLING  01/2002   Archie Endo 01/23/2011   REDUCTION MAMMAPLASTY     TEE WITHOUT CARDIOVERSION N/A 08/24/2021   Procedure: TRANSESOPHAGEAL ECHOCARDIOGRAM (TEE);  Surgeon: Sherren Mocha, MD;  Location: [facility];  Service: Cardiovascular;  Laterality: N/A;   TEMPORARY PACEMAKER N/A 07/01/2017   Procedure: TEMPORARY PACEMAKER;  Surgeon: Belva Crome, MD;  Location: [facility];  Service: Cardiovascular;  Laterality: N/A;   VAGINAL HYSTERECTOMY  01/2002   Vaginal hysterectomy, bilateral salpingo-oophorectomy/notes 01/23/2011    Current Medications: Current Meds  Medication Sig   acetaminophen (TYLENOL) 500 MG tablet Take 1,000 mg by mouth every 6 (six) hours as needed (body aches / sore hip). Maximum of 6 tablets daily ('3000mg'$ )   apixaban (ELIQUIS) 2.5 MG TABS tablet TAKE 1 TABLET BY MOUTH TWICE  DAILY   atorvastatin (LIPITOR) 40 MG tablet Take 1 tablet (40 mg total) by mouth every Monday, Wednesday, and Friday.   BIOTIN PO Take 5,000 mcg by mouth in the morning.   Cholecalciferol (VITAMIN D3) 50 MCG (2000 UT) capsule Take 1 capsule (2,000 Units total) by mouth daily.   Cyanocobalamin (VITAMIN B-12) 1000 MCG SUBL Place 1 tablet (1,000 mcg total) under the tongue daily.   diclofenac Sodium (VOLTAREN) 1 % GEL Apply 2 g topically 4 (four) times daily as needed (back pain.).   dicyclomine (BENTYL) 20 MG tablet Take 1 by mouth every 4-6 hours as needed for cramping   DULoxetine (CYMBALTA) 20 MG capsule Take 40 mg x 1 week, then 20 mg/day   esomeprazole (NEXIUM) 40 MG capsule TAKE ONE CAPSULE ONCE DAILY. (Patient taking differently: Take 40 mg by mouth daily.)   Homeopathic Products (ARNICARE)  GEL Apply 1 application topically daily as needed (skin bruising).   hyoscyamine (LEVSIN) 0.125 MG tablet TAKE 1-2 TABLETS (0.125-0.25 MG) BY MOUTH EVERY 4 HOURS AS NEEDED FOR UP TO 10 DAYS FOR CRAMPING. (Patient taking differently: Take 0.125-0.25 mg by mouth every 4 (four) hours as needed for cramping.)   lidocaine (XYLOCAINE) 5 % ointment Apply 1 Application topically as needed. Apply to the area twice a day to three times if the vulvar area is painful.   LORazepam (ATIVAN) 1 MG tablet TAKE ONE TABLET BY MOUTH TWICE DAILY AS NEEDED FOR ANXIETY / SLEEP.   losartan (COZAAR) 25 MG tablet Take 0.5 tablets (12.5 mg total) by mouth daily.   metoprolol succinate (TOPROL-XL) 25 MG 24 hr tablet Take 0.5 tablets (12.5 mg total) by mouth daily.   Multiple Vitamins-Minerals (PRESERVISION AREDS 2+MULTI VIT) CAPS Take 1  capsule by mouth daily.   nitroGLYCERIN (NITROSTAT) 0.4 MG SL tablet Place 0.4 mg under the tongue every 5 (five) minutes as needed for chest pain (Call 911 at 3rd dose within 15 minutes.).   ondansetron (ZOFRAN-ODT) 8 MG disintegrating tablet Take 1 tablet (8 mg total) by mouth every 8 (eight) hours as needed for nausea or vomiting.   Polyethyl Glycol-Propyl Glycol (LUBRICANT EYE DROPS) 0.4-0.3 % SOLN Place 1-2 drops into both eyes at bedtime.   polyethylene glycol (MIRALAX / GLYCOLAX) 17 g packet Take 17 g by mouth daily as needed for mild constipation.   saccharomyces boulardii (FLORASTOR) 250 MG capsule Take 1 capsule (250 mg total) by mouth 2 (two) times daily. (Patient taking differently: Take 250 mg by mouth daily.)   sodium chloride (OCEAN) 0.65 % SOLN nasal spray Place 1 spray into both nostrils at bedtime as needed for congestion.   [DISCONTINUED] empagliflozin (JARDIANCE) 10 MG TABS tablet Take 1 tablet (10 mg total) by mouth daily before breakfast.   [DISCONTINUED] spironolactone (ALDACTONE) 25 MG tablet Take 0.5 tablets (12.5 mg total) by mouth daily. (Patient taking differently: Take  12.5 mg by mouth every Monday, Wednesday, and Friday.)     Allergies:   Macrobid [nitrofurantoin monohyd macro], Meloxicam, Digoxin and related, Diprolene [betamethasone dipropionate aug], Ferrous sulfate, Keflex [cephalexin], Mobic [meloxicam], and Sulfamethoxazole-trimethoprim   Social History   Socioeconomic History   Marital status: Married    Spouse name: [name]   Number of children: 2   Years of education: Not on file   Highest education level: Not on file  Occupational History   Occupation: Patent attorney: RETIRED  Tobacco Use   Smoking status: Former    Packs/day: 0.25    Years: 28.00    Total pack years: 7.00    Types: Cigarettes    Quit date: 1981    Years since quitting: 42.7   Smokeless tobacco: Never  Vaping Use   Vaping Use: Never used  Substance and Sexual Activity   Alcohol use: Yes    Comment: 7 vodka drinks a week   Drug use: Never   Sexual activity: Not Currently    Birth control/protection: Surgical    Comment: hysterectomy  Other Topics Concern   Not on file  Social History Narrative   Regular Exercise -  YES         Social Determinants of Health   Financial Resource Strain: Low Risk  (02/14/2022)   Overall Financial Resource Strain (CARDIA)    Difficulty of Paying Living Expenses: Not hard at all  Food Insecurity: No Food Insecurity (02/14/2022)   Hunger Vital Sign    Worried About Running Out of Food in the Last Year: Never true    Ran Out of Food in the Last Year: Never true  Transportation Needs: No Transportation Needs (02/14/2022)   PRAPARE - Hydrologist (Medical): No    Lack of Transportation (Non-Medical): No  Physical Activity: Inactive (02/14/2022)   Exercise Vital Sign    Days of Exercise per Week: 0 days    Minutes of Exercise per Session: 0 min  Stress: No Stress Concern Present (02/14/2022)   [place]    Feeling of Stress :  Only a little  Social Connections: Moderately Integrated (02/14/2022)   Social Connection and Isolation Panel [NHANES]    Frequency of Communication with Friends and Family: More than three times a week  Frequency of Social Gatherings with Friends and Family: Three times a week    Attends Religious Services: More than 4 times per year    Active Member of Clubs or Organizations: No    Attends Archivist Meetings: Never    Marital Status: Married     Family History: The patient's family history includes Colon cancer in her mother; Diabetes in her father; Pancreatic cancer in her brother; Prostate cancer in her brother and father; Stomach cancer in her son. There is no history of Heart attack, Stroke, or Esophageal cancer.  ROS:   Please see the history of present illness.    She feels a chronic problem with vulvar irritation has gotten worse since starting Jardiance.  All other systems reviewed and are negative.  EKGs/Labs/Other Studies Reviewed:    The following studies were reviewed today: No new data  EKG:  EKG sinus rhythm at 80 bpm first-degree AV block on tracing performed April 26, 2022 during hospitalization for trauma to right hip related to fall.  Recent Labs: 07/05/2021: NT-Pro BNP 1,355 04/27/2022: Magnesium 2.0 05/03/2022: ALT 12; BUN 21; Creatinine, Ser 1.03; Hemoglobin 8.2 Repeated and verified X2.; Platelets 344.0; Potassium 4.4; Sodium 137  Recent Lipid Panel    Component Value Date/Time   CHOL 167 01/29/2022 1006   TRIG 113 01/29/2022 1006   TRIG 183 (H) 07/26/2006 0903   HDL 109 01/29/2022 1006   CHOLHDL 1.5 01/29/2022 1006   CHOLHDL 2.2 07/04/2014 0528   VLDL 19 07/04/2014 0528   LDLCALC 39 01/29/2022 1006   LDLDIRECT 89.1 06/29/2013 1604    Physical Exam:    VS:  BP 112/64   Pulse (!) 55   Ht 5' 2.75" (1.594 m)   Wt 140 lb 6.4 oz (63.7 kg)   SpO2 97%   BMI 25.07 kg/m     Wt Readings from Last 3 Encounters:  05/28/22 140 lb 6.4 oz (63.7  kg)  05/15/22 143 lb (64.9 kg)  05/03/22 146 lb 12.8 oz (66.6 kg)     GEN: Compatible with age. No acute distress HEENT: Normal NECK: No JVD. LYMPHATICS: No lymphadenopathy CARDIAC: No murmur irregular RR but not felt to be A-fib no gallop, or edema. VASCULAR:  Normal Pulses. No bruits. RESPIRATORY:  Clear to auscultation without rales, wheezing or rhonchi  ABDOMEN: Soft, non-tender, non-distended, No pulsatile mass, MUSCULOSKELETAL: No deformity  SKIN: Warm and dry NEUROLOGIC:  Alert and oriented x 3 PSYCHIATRIC:  Normal affect   ASSESSMENT:    1. Coronary artery disease involving native coronary artery of native heart without angina pectoris   2. Permanent atrial fibrillation ([place])   3. Chronic anticoagulation   4. Essential hypertension   5. Hyperlipidemia LDL goal <70   6. Neurogenic orthostatic hypotension (HCC)   7. Aneurysm of ascending aorta without rupture (HCC)    PLAN:    In order of problems listed above:  Asymptomatic relative to anginal complaints. Last EKG demonstrated sinus rhythm as has been the case G's intermittently.  An EKG in June revealed atrial flutter with 2 1 AV conduction.  There is still indication for anticoagulation therapy. We decided upon a shared decision to lower the dose of Eliquis to 2.5 mg twice daily because of frequent falls and bleeding complications.  Most recently a large right hip hematoma. Blood pressure is low for age.  Target 140/80 mmHg.  Discontinue Jardiance and spironolactone. Continue atorvastatin. Possibly contributing to recent falls. Continue losartan and metoprolol.  Follow-up in 6 to  9 months.  New cardiology assignment at that office visit.     Medication Adjustments/Labs and Tests Ordered: Current medicines are reviewed at length with the patient today.  Concerns regarding medicines are outlined above.  No orders of the defined types were placed in this encounter.  No orders of the defined types were placed in  this encounter.   Patient Instructions  Medication Instructions:  Your physician has recommended you make the following change in your medication:   1) STOP empagliflozin (Jardiance) 2) STOP spironolactone  **Please call our office at [phone] or send message via MyChart if you have any increased swelling or shortness of breath.**  *If you need a refill on your cardiac medications before your next appointment, please call your pharmacy*  Lab Work: NONE  Testing/Procedures: NONE  Follow-Up: At [facility], you and your health needs are our priority.  As part of our continuing mission to provide you with exceptional heart care, we have created designated Provider Care Teams.  These Care Teams include your primary Cardiologist (physician) and Advanced Practice Providers (APPs -  Physician Assistants and Nurse Practitioners) who all work together to provide you with the care you need, when you need it.  Your next appointment:   6-9 month(s)  The format for your next appointment:   In Person  Provider:   Sinclair Grooms, MD  or Robbie Lis, PA-C, Christen Bame, NP, or Richardson Dopp, [place]     Important Information About Sugar         Signed, Sinclair Grooms, MD  05/28/2022 3:18 PM    [place]

## 2022-05-28 ENCOUNTER — Ambulatory Visit: Payer: Medicare Other | Attending: Interventional Cardiology | Admitting: Interventional Cardiology

## 2022-05-28 ENCOUNTER — Encounter: Payer: Self-pay | Admitting: Physical Therapy

## 2022-05-28 ENCOUNTER — Ambulatory Visit: Payer: Medicare Other | Admitting: Physical Therapy

## 2022-05-28 ENCOUNTER — Encounter: Payer: Self-pay | Admitting: Interventional Cardiology

## 2022-05-28 VITALS — BP 112/64 | HR 55 | Ht 62.75 in | Wt 140.4 lb

## 2022-05-28 DIAGNOSIS — R2689 Other abnormalities of gait and mobility: Secondary | ICD-10-CM | POA: Diagnosis not present

## 2022-05-28 DIAGNOSIS — Z7901 Long term (current) use of anticoagulants: Secondary | ICD-10-CM

## 2022-05-28 DIAGNOSIS — I7121 Aneurysm of the ascending aorta, without rupture: Secondary | ICD-10-CM

## 2022-05-28 DIAGNOSIS — I10 Essential (primary) hypertension: Secondary | ICD-10-CM

## 2022-05-28 DIAGNOSIS — E785 Hyperlipidemia, unspecified: Secondary | ICD-10-CM | POA: Diagnosis not present

## 2022-05-28 DIAGNOSIS — I2510 Atherosclerotic heart disease of native coronary artery without angina pectoris: Secondary | ICD-10-CM

## 2022-05-28 DIAGNOSIS — R2681 Unsteadiness on feet: Secondary | ICD-10-CM

## 2022-05-28 DIAGNOSIS — G903 Multi-system degeneration of the autonomic nervous system: Secondary | ICD-10-CM | POA: Diagnosis not present

## 2022-05-28 DIAGNOSIS — I4821 Permanent atrial fibrillation: Secondary | ICD-10-CM | POA: Diagnosis not present

## 2022-05-28 DIAGNOSIS — M6281 Muscle weakness (generalized): Secondary | ICD-10-CM

## 2022-05-28 NOTE — Patient Instructions (Signed)
Medication Instructions:  Your physician has recommended you make the following change in your medication:   1) STOP empagliflozin (Jardiance) 2) STOP spironolactone  **Please call our office at [phone] or send message via MyChart if you have any increased swelling or shortness of breath.**  *If you need a refill on your cardiac medications before your next appointment, please call your pharmacy*  Lab Work: NONE  Testing/Procedures: NONE  Follow-Up: At [facility], you and your health needs are our priority.  As part of our continuing mission to provide you with exceptional heart care, we have created designated Provider Care Teams.  These Care Teams include your primary Cardiologist (physician) and Advanced Practice Providers (APPs -  Physician Assistants and Nurse Practitioners) who all work together to provide you with the care you need, when you need it.  Your next appointment:   6-9 month(s)  The format for your next appointment:   In Person  Provider:   Sinclair Grooms, MD  or Robbie Lis, PA-C, Christen Bame, NP, or Richardson Dopp, PA-C     Important Information About Sugar

## 2022-05-28 NOTE — Therapy (Signed)
OUTPATIENT PHYSICAL THERAPY TREATMENT NOTE   Patient Name: Hailey [name] MRN: [id number] DOB:1934-10-12, 87 y.o., female Today's Date: 05/28/2022  PCP: Cassandria Anger, MD   REFERRING PROVIDER: Stefanie Libel, MD   PT End of Session - 05/28/22 1133     Visit Number 2    Number of Visits --   1-2x/week   Date for PT Re-Evaluation 07/12/22    Authorization Type [employer] MCR - FOTO    Progress Note Due on Visit 10    PT Start Time 1133    PT Stop Time 1214    PT Time Calculation (min) 41 min             Past Medical History:  Diagnosis Date   Acute blood loss anemia 12/25/2017   Allergic rhinitis    Anxiety    Arthritis    "my whole spine" (07/01/2017)   Atrial fibrillation (HCC)    Bowel obstruction (HCC)    in [place]   Bradycardia with 41-50 beats per minute 12/27/2017   Cancer ([place])    Cellulitis of leg, right with large prepatella hematoma and open wounds 12/26/2017   CKD (chronic kidney disease) stage 2, GFR 60-89 ml/min    Colon polyps    Coronary artery disease    10/18 PCI/DES to p/m LCx with cutting balloon to mLcx   Diverticulosis of colon    GERD (gastroesophageal reflux disease)    Hip bursitis 2010   Dr Para March, Post op seroma   History of colon polyps    HSV (herpes simplex virus) anogenital infection 07/2019   HTN (hypertension)    IBS (irritable bowel syndrome)    constipation predominant - Dr Earlean Shawl   Lichen sclerosus    Osteopenia 11/2016   T score -2.0 FRAX 15%/4.3%   PAC (premature atrial contraction)    Symptomatiic   Renal insufficiency    Right bundle branch block (RBBB) on electrocardiogram (ECG) 12/27/2017   Scoliosis    SVT (supraventricular tachycardia) ([place])    brief history   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   biopsy showing vulvar atypia, possible VIN I   Past Surgical History:  Procedure Laterality Date   ANTERIOR AND POSTERIOR VAGINAL REPAIR  01/2002   Archie Endo 01/23/2011   APPENDECTOMY  1948   CARDIAC  CATHETERIZATION  06/26/2017   CORONARY ANGIOPLASTY WITH STENT PLACEMENT  07/01/2017   CORONARY ATHERECTOMY N/A 07/01/2017   Procedure: CORONARY ATHERECTOMY;  Surgeon: Belva Crome, MD;  Location: [facility];  Service: Cardiovascular;  Laterality: N/A;   CORONARY STENT INTERVENTION N/A 07/01/2017   Procedure: CORONARY STENT INTERVENTION;  Surgeon: Belva Crome, MD;  Location: [facility];  Service: Cardiovascular;  Laterality: N/A;   HAMMER TOE SURGERY     HEMORRHOID BANDING     HIP SURGERY Left 04/2009   hip examination under anesthesia followed by greater trochanteric bursectomy; iliotibial band tenotomy/notes 01/20/2011   I & D EXTREMITY Right 01/10/2018   Procedure: IRRIGATION AND DEBRIDEMENT RIGHT KNEE, APPLY WOUND VAC;  Surgeon: Newt Minion, MD;  Location: [facility];  Service: Orthopedics;  Laterality: Right;   KNEE BURSECTOMY Right 04/2009   Archie Endo 01/09/2011   LEFT ATRIAL APPENDAGE OCCLUSION N/A 08/24/2021   Procedure: LEFT ATRIAL APPENDAGE OCCLUSION;  Surgeon: Sherren Mocha, MD;  Location: [facility];  Service: Cardiovascular;  Laterality: N/A;   LEFT HEART CATH AND CORONARY ANGIOGRAPHY N/A 06/26/2017   Procedure: LEFT HEART CATH AND CORONARY ANGIOGRAPHY;  Surgeon: Belva Crome, MD;  Location: [facility];  Service: Cardiovascular;  Laterality: N/A;   PUBOVAGINAL SLING  01/2002   Archie Endo 01/23/2011   REDUCTION MAMMAPLASTY     TEE WITHOUT CARDIOVERSION N/A 08/24/2021   Procedure: TRANSESOPHAGEAL ECHOCARDIOGRAM (TEE);  Surgeon: Sherren Mocha, MD;  Location: [facility];  Service: Cardiovascular;  Laterality: N/A;   TEMPORARY PACEMAKER N/A 07/01/2017   Procedure: TEMPORARY PACEMAKER;  Surgeon: Belva Crome, MD;  Location: [facility];  Service: Cardiovascular;  Laterality: N/A;   VAGINAL HYSTERECTOMY  01/2002   Vaginal hysterectomy, bilateral salpingo-oophorectomy/notes 01/23/2011   Patient Active Problem List   Diagnosis Date Noted    Hematoma of right thigh 04/25/2022   Weakness 04/25/2022   Weakness generalized 04/25/2022   Falls frequently 04/19/2022   Iron deficiency anemia 12/08/2021   Herpesviral infection, unspecified 11/22/2021   Skin ulcer, limited to breakdown of skin ([place]) 11/22/2021   Anemia, iron deficiency 11/22/2021   Low serum vitamin B12 11/22/2021   Acute renal failure superimposed on stage 2 chronic kidney disease ([place]) 11/20/2021   ABLA (acute blood loss anemia) 11/20/2021   UTI (urinary tract infection) 11/20/2021   Acute diverticulitis 11/20/2021   Hypotension 11/19/2021   Shortness of breath 06/12/2021   Right leg pain 02/21/2021   Diverticulitis 01/08/2021   Acute respiratory failure with hypoxia (HCC)    Bacterial colitis    Abnormal CT of the abdomen    Diarrhea of infectious origin    Acute colitis 08/27/2020   Aortic atherosclerosis ([place]) 08/27/2020   Acute prerenal azotemia 08/27/2020   Shock circulatory ([place]) 43/32/9518   Metabolic acidosis with normal anion gap and bicarbonate losses 08/27/2020   Dehydration    Squamous cell cancer of skin of left cheek 84/16/6063   Lichen sclerosus 01/60/1093   Abdominal cramping 03/16/2020   Neurogenic orthostatic hypotension ([place]) 09/16/2019   Injury of face 08/31/2019   Leg abrasion, infected, right, subsequent encounter 08/28/2019   Face lacerations 08/26/2019   Nose fracture 08/26/2019   Concussion 08/26/2019   Wrist pain, acute, right 08/26/2019   Greater trochanteric pain syndrome of right lower extremity 07/30/2019   Viral URI with cough 08/15/2018   Abdominal pain 05/22/2018   Traumatic hematoma of right knee 01/10/2018   Ulcer of right knee (HCC)    Bradycardia with 41-50 beats per minute 12/27/2017   Right bundle branch block (RBBB) on electrocardiogram (ECG) 12/27/2017   Quadriceps tendon rupture 12/24/2017   Chronic kidney disease, stage 3b ([place]) 12/24/2017   Cellulitis of leg, right with large prepatella hematoma and open  wounds 12/24/2017   Iliotibial band syndrome of right side 12/16/2017   Depression 07/11/2017   Intervertebral lumbar disc disorder with myelopathy, lumbar region 04/25/2017   Family history of colon cancer in mother 01/22/2017   Primary osteoarthritis of both first carpometacarpal joints 01/02/2017   Pain 11/21/2016   Gastroenteritis 09/13/2016   Chronic anticoagulation 05/24/2015   Coronary artery disease involving native coronary artery of native heart with angina pectoris ([place]) 12/20/2014   Iliotibial band syndrome of left side 11/16/2014   Diverticulitis of colon 08/30/2014   Chest pain, atypical 07/03/2014   Diarrhea 12/22/2013   Permanent atrial fibrillation ([place]) 11/21/2012   Greater trochanteric bursitis of both hips 05/21/2012   Dizzinesses 11/07/2011   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   Osteoarthritis of left hip 07/03/2010   DEGENERATIVE DISC DISEASE, LUMBOSACRAL SPINE 05/18/2010   SYNCOPE 10/27/2008   Essential hypertension 06/16/2007  GERD (gastroesophageal reflux disease) 06/16/2007   COLONIC POLYPS, HX OF 06/16/2007    THERAPY DIAG:  Unsteadiness on feet  Muscle weakness  Other abnormalities of gait and mobility  REFERRING DIAG: Balance problem [R26.89], Fall on same level from slipping, tripping or stumbling, initial encounter [W01.0XXA]  PERTINENT HISTORY: Afib, bradycardia, hypotension  PRECAUTIONS/RESTRICTIONS:   Fall  SUBJECTIVE:  Pt reports that she continues to have some issues with her hematoma and this is causing her some pain.  She has not completed her HEP d/t hematoma pain.  Pain:  Are you having pain? Yes Pain location: Hematoma R thigh NPRS scale:  current 4/10  Stability: getting better  OBJECTIVE: (objective measures completed at initial evaluation unless otherwise dated)  DIAGNOSTIC FINDINGS:  IMPRESSION: 1. No acute fracture or dislocation. 2. Subcutaneous fat stranding over the lateral aspect of the right hip and thigh  compatible with contusion. There are hyperdense collections lateral to the right hip, the largest measuring 15.0 x 7.1 x 8.0 cm suggesting hematoma given history of falls.             GENERAL OBSERVATION/GAIT:                     Arrives in manual WC, glute weakness compensation with posterior w/s in gait   SENSATION:          Light touch: Appears intact   PALPATION: TTP R lateral thigh and L knee   LE MMT:   MMT Right 05/17/2022 Left 05/17/2022  Hip flexion (L2, L3) 3+ 3+  Knee extension (L3) 3+ 3+  Knee flexion 3+ 3+  Hip abduction 3 3+  Hip extension      Hip external rotation      Hip internal rotation      Hip adduction      Ankle dorsiflexion (L4)      Ankle plantarflexion (S1)      Ankle inversion      Ankle eversion      Great Toe ext (L5)      Grossly        (Blank rows = not tested, score listed is out of 5 possible points.  N = WNL, D = diminished, C = clear for gross weakness with myotome testing, * = concordant pain with testing)   Functional Tests   Eval (05/17/2022)      BERG BALANCE TEST Sitting to Standing: 4.      Stands without using hands and stabilize independently Standing Unsupported: 4.      Stands safely for 2 minutes Sitting Unsupported: 4.     Sits for 2 minutes independently Standing to Sitting: 3.     Controls descent with hands  Transfers: 3.     Transfers safely definite use of hands Standing with eyes closed: 4.     Stands safely for 10 seconds  Standing with feet together: 4.     Stands for 1 minute safely Reaching forward with outstretched arm: 3.     Reaches forward 5 inches Retrieving object from the floor: 3.     Able to pick up with supervision Turning to look behind: 3.     Looks behind one side only, other side less weight shift Turning 360 degrees: 2.     Able to turn slowly, but safely Place alternate foot on stool: 3.     Completes 8 steps in >20 seconds Standing with one foot in front: 4.     Independent tandem  for 30 seconds   Standing on one foot: 0.     Unable   Total Score: 44/56        30'' STS: 9x  UE used? n        10 m max gait speed: 10'', 1 m/s, AD: n                                                                                         PATIENT SURVEYS:  FOTO 45 -> 52     TODAY'S TREATMENT: Creating, reviewing, and completing below HEP     PATIENT EDUCATION:  POC, diagnosis, prognosis, HEP, and outcome measures.  Pt educated via explanation, demonstration, and handout (HEP).  Pt confirms understanding verbally.    HOME EXERCISE PROGRAM: Access Code: [email or web address] URL: [email or web address] Date: 05/17/2022 Prepared by: Shearon Balo   Exercises - Bridge  - 1 x daily - 7 x weekly - 3 sets - 10 reps - 3'' hold - Hooklying Isometric Clamshell  - 1 x daily - 7 x weekly - 3 sets - 10 reps - Standing Tandem Balance with Counter Support  - 1 x daily - 7 x weekly - 3 sets - 45'' hold   ASTERISK SIGNS     Asterisk Signs Eval (05/17/2022)            BERG 44            SLS unable            LE MMT 3+ grossly                                              TREATMENT 9/18:  Therapeutic Exercise: - nu-step L5 79mwhile taking subjective and planning session with patient - slant board stretch - 2' - heel raises on step - 3x10 - sit to stand - 3x10 with 2 kg OH press - Lateral walking with YTB at ankles - 3 laps  Neuromuscular re-ed: - tandem on foam - 45'' bouts - rocker board fwd/lat - 2' ea     ASSESSMENT:   CLINICAL IMPRESSION: Corky tolerated session well with no adverse reaction.  She is particularly challenged with DF (tib anterior) during rocker board and lateral walking.  She is able to complete all exercises with good form, but fatigue.   OBJECTIVE IMPAIRMENTS: Pain, gait, balance, LE strength   ACTIVITY LIMITATIONS: transfers, housework, ambulation, bending, squatting   PERSONAL FACTORS: See medical history and pertinent history     REHAB  POTENTIAL: Good   CLINICAL DECISION MAKING: Stable/uncomplicated   EVALUATION COMPLEXITY: Low     GOALS:     SHORT TERM GOALS: Target date: 06/07/2022   CBryawill be >75% HEP compliant to improve carryover between sessions and facilitate independent management of condition   Evaluation (05/17/2022): ongoing Goal status: INITIAL     LONG TERM GOALS: Target date: 07/12/2022   CAujanaewill improve FOTO score to 52 as a proxy for functional improvement   Evaluation/Baseline (05/17/2022): 45 Goal status: INITIAL  2.  Rocky will improve 30'' STS (MCID 2) to >/= 11x (w/ UE?: n) to show improved LE strength and improved transfers    Evaluation/Baseline (05/17/2022): 9x  w/ UE? n Goal status: INITIAL     3.  Keirstan will improve 10 meter max gait speed to 1.2 m/s (.1 m/s MCID) to show functional improvement in ambulation    Evaluation/Baseline (05/17/2022): 1 m/s Goal status: INITIAL     [place]:        4.  Seylah will imporve BERG balance score to 50 pts, to show a significant improvement in balance in order to reduce fall risk   Evaluation/Baseline (05/17/2022): 44 pts Goal status: INITIAL   See interpretation below:            PLAN: PT FREQUENCY: 1-2x/week   PT DURATION: 8 weeks (Ending 07/12/2022)   PLANNED INTERVENTIONS: Therapeutic exercises, Aquatic therapy, Therapeutic activity, Neuro Muscular re-education, Gait training, Patient/Family education, Joint mobilization, Dry Needling, Electrical stimulation, Spinal mobilization and/or manipulation, Moist heat, Taping, Vasopneumatic device, Ionotophoresis '4mg'$ /ml Dexamethasone, and Manual therapy   PLAN FOR NEXT SESSION: progressive balance on unstable surfaces, SLS, with head turns, general LE strengthening, gait   Kevan Ny Alencia Gordon PT 05/28/2022, 12:14 PM

## 2022-05-28 NOTE — Progress Notes (Unsigned)
GYNECOLOGY  VISIT   HPI: 87 y.o.   Married  Caucasian  female   G2P2001 with No LMP recorded.   here for recheck and suture removal.  Left vulvar biopsies on 05/15/22.  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    Patient has known left vulvar lichen sclerosus and has been followed for this for years.  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   She has not been using Clobetasol for a while.   She has been seen by Dr. Daryll Drown at [facility] for the lichen sclerosus as well.   Using lidocaine at night, and this helps. She is also using Aquafor.   Just stopped Jardiance.  Cardiology was prescribing.   Took oral Gabapentin in past, possible for back pain.   GYNECOLOGIC HISTORY: No LMP recorded. Contraception:  Hyst Menopausal hormone therapy:  none Last mammogram:   02-08-22 Diag.Rt./Neg/BiRads1. 01-29-22 Rt.Br.poss.asymmetry;Lt.Br.Neg. Last pap smear:  Years ago        OB History     Gravida  2   Para  2   Term  2   Preterm      AB      Living  1      SAB      IAB      Ectopic      Multiple      Live Births                 Patient Active Problem List   Diagnosis Date Noted   Hematoma of right thigh 04/25/2022   Weakness 04/25/2022   Weakness generalized 04/25/2022   Falls frequently 04/19/2022   Iron deficiency anemia 12/08/2021   Herpesviral infection, unspecified 11/22/2021   Skin ulcer, limited to breakdown of skin ([place]) 11/22/2021   Anemia, iron deficiency 11/22/2021   Low serum vitamin B12 11/22/2021   Acute renal failure superimposed on stage 2 chronic kidney disease ([place]) 11/20/2021   ABLA (acute blood loss anemia) 11/20/2021   UTI (urinary tract infection) 11/20/2021   Acute diverticulitis 11/20/2021   Hypotension 11/19/2021   Shortness of breath 06/12/2021   Right leg pain 02/21/2021   Diverticulitis 01/08/2021   Acute respiratory failure with hypoxia (HCC)     Bacterial colitis    Abnormal CT of the abdomen    Diarrhea of infectious origin    Acute colitis 08/27/2020   Aortic atherosclerosis ([place]) 08/27/2020   Acute prerenal azotemia 08/27/2020   Shock circulatory ([place]) 23/55/7322   Metabolic acidosis with normal anion gap and bicarbonate losses 08/27/2020   Dehydration    Squamous cell cancer of skin of left cheek 02/54/2706   Lichen sclerosus 23/76/2831   Abdominal cramping 03/16/2020   Neurogenic orthostatic hypotension ([place]) 09/16/2019   Injury of face 08/31/2019   Leg abrasion, infected, right, subsequent encounter 08/28/2019   Face lacerations 08/26/2019   Nose fracture 08/26/2019   Concussion 08/26/2019   Wrist pain, acute, right 08/26/2019   Greater trochanteric pain syndrome of right lower extremity 07/30/2019   Viral URI with cough 08/15/2018   Abdominal pain 05/22/2018   Traumatic hematoma of right knee 01/10/2018   Ulcer of right knee (HCC)    Bradycardia with 41-50 beats per minute 12/27/2017   Right bundle branch block (RBBB) on electrocardiogram (ECG) 12/27/2017   Quadriceps tendon rupture 12/24/2017   Chronic kidney disease, stage 3b ([place]) 12/24/2017   Cellulitis of leg, right with large prepatella hematoma and open wounds 12/24/2017  Iliotibial band syndrome of right side 12/16/2017   Depression 07/11/2017   Intervertebral lumbar disc disorder with myelopathy, lumbar region 04/25/2017   Family history of colon cancer in mother 01/22/2017   Primary osteoarthritis of both first carpometacarpal joints 01/02/2017   Pain 11/21/2016   Gastroenteritis 09/13/2016   Chronic anticoagulation 05/24/2015   Coronary artery disease involving native coronary artery of native heart with angina pectoris ([place]) 12/20/2014   Iliotibial band syndrome of left side 11/16/2014   Diverticulitis of colon 08/30/2014   Chest pain, atypical 07/03/2014   Diarrhea 12/22/2013   Permanent atrial fibrillation ([place]) 11/21/2012   Greater  trochanteric bursitis of both hips 05/21/2012   Dizzinesses 11/07/2011   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   Osteoarthritis of left hip 07/03/2010   DEGENERATIVE [place] DISEASE, LUMBOSACRAL SPINE 05/18/2010   SYNCOPE 10/27/2008   Essential hypertension 06/16/2007   GERD (gastroesophageal reflux disease) 06/16/2007   COLONIC POLYPS, HX OF 06/16/2007    Past Medical History:  Diagnosis Date   Acute blood loss anemia 12/25/2017   Allergic rhinitis    Anxiety    Arthritis    "my whole spine" (07/01/2017)   Atrial fibrillation (HCC)    Bowel obstruction (HCC)    in [place]   Bradycardia with 41-50 beats per minute 12/27/2017   Cancer ([place])    Cellulitis of leg, right with large prepatella hematoma and open wounds 12/26/2017   CKD (chronic kidney disease) stage 2, GFR 60-89 ml/min    Colon polyps    Coronary artery disease    10/18 PCI/DES to p/m LCx with cutting balloon to mLcx   Diverticulosis of colon    GERD (gastroesophageal reflux disease)    Hip bursitis 2010   Dr Para March, Post op seroma   History of colon polyps    HSV (herpes simplex virus) anogenital infection 07/2019   HTN (hypertension)    IBS (irritable bowel syndrome)    constipation predominant - Dr Earlean Shawl   Lichen sclerosus    Osteopenia 11/2016   T score -2.0 FRAX 15%/4.3%   PAC (premature atrial contraction)    Symptomatiic   Renal insufficiency    Right bundle branch block (RBBB) on electrocardiogram (ECG) 12/27/2017   Scoliosis    SVT (supraventricular tachycardia) ([place])    brief history   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;  Service: Cardiovascular;  Laterality: N/A;   HAMMER TOE SURGERY     HEMORRHOID BANDING     HIP SURGERY Left 04/2009   hip examination under anesthesia followed by greater trochanteric bursectomy; iliotibial band tenotomy/notes 01/20/2011   I & D EXTREMITY Right 01/10/2018   Procedure: IRRIGATION AND DEBRIDEMENT RIGHT KNEE, APPLY WOUND VAC;  Surgeon: Newt Minion, MD;  Location: [facility];  Service: Orthopedics;  Laterality: Right;   KNEE BURSECTOMY Right 04/2009   Archie Endo 01/09/2011   LEFT ATRIAL APPENDAGE  OCCLUSION N/A 08/24/2021   Procedure: LEFT ATRIAL APPENDAGE OCCLUSION;  Surgeon: Sherren Mocha, MD;  Location: [facility];  Service: Cardiovascular;  Laterality: N/A;   LEFT HEART CATH AND CORONARY ANGIOGRAPHY N/A 06/26/2017   Procedure: LEFT HEART CATH AND CORONARY ANGIOGRAPHY;  Surgeon: Belva Crome, MD;  Location: [facility];  Service: Cardiovascular;  Laterality: N/A;   PUBOVAGINAL SLING  01/2002   Archie Endo 01/23/2011   REDUCTION MAMMAPLASTY     TEE WITHOUT CARDIOVERSION N/A 08/24/2021   Procedure: TRANSESOPHAGEAL ECHOCARDIOGRAM (TEE);  Surgeon: Sherren Mocha, MD;  Location: [facility];  Service: Cardiovascular;  Laterality: N/A;   TEMPORARY PACEMAKER N/A 07/01/2017   Procedure: TEMPORARY PACEMAKER;  Surgeon: Belva Crome, MD;  Location: [facility];  Service: Cardiovascular;  Laterality: N/A;   VAGINAL HYSTERECTOMY  01/2002   Vaginal hysterectomy, bilateral salpingo-oophorectomy/notes 01/23/2011    Current Outpatient Medications  Medication Sig Dispense Refill   acetaminophen (TYLENOL) 500 MG tablet Take 1,000 mg by mouth every 6 (six) hours as needed (body aches / sore hip). Maximum of 6 tablets daily  ('3000mg'$ )     apixaban (ELIQUIS) 2.5 MG TABS tablet TAKE 1 TABLET BY MOUTH TWICE  DAILY 200 tablet 1   atorvastatin (LIPITOR) 40 MG tablet Take 1 tablet (40 mg total) by mouth every Monday, Wednesday, and Friday. 45 tablet 3   betamethasone dipropionate (DIPROLENE) 0.05 % ointment Apply 2 times a day to affected areas     BIOTIN PO Take 5,000 mcg by mouth in the morning.     Cholecalciferol (VITAMIN D3) 50 MCG (2000 UT) capsule Take 1 capsule (2,000 Units total) by mouth daily. 100 capsule 3   Cyanocobalamin (VITAMIN B-12) 1000 MCG SUBL Place 1 tablet (1,000 mcg total) under the tongue daily. 100 tablet 3   diclofenac Sodium (VOLTAREN) 1 % GEL Apply 2 g topically 4 (four) times daily as needed (back pain.).     dicyclomine (BENTYL) 20 MG tablet Take 1 by mouth every 4-6 hours as needed for cramping 30 tablet 3   DULoxetine (CYMBALTA) 20 MG capsule Take 40 mg x 1 week, then 20 mg/day 90 capsule 3   esomeprazole (NEXIUM) 40 MG capsule TAKE ONE CAPSULE ONCE DAILY. (Patient taking differently: Take 40 mg by mouth daily.) 90 capsule 1   Homeopathic Products (ARNICARE) GEL Apply 1 application topically daily as needed (skin bruising).     hyoscyamine (LEVSIN) 0.125 MG tablet TAKE 1-2 TABLETS (0.125-0.25 MG) BY MOUTH EVERY 4 HOURS AS NEEDED FOR UP TO 10 DAYS FOR CRAMPING. (Patient taking differently: Take 0.125-0.25 mg by mouth every 4 (four) hours as needed for cramping.) 100 tablet 1   lidocaine (XYLOCAINE) 5 % ointment Apply 1 Application topically as needed. Apply to the area twice a day to three times if the vulvar area is painful. 35.44 g 0   LORazepam (ATIVAN) 1 MG tablet TAKE ONE TABLET BY MOUTH TWICE DAILY AS NEEDED FOR ANXIETY / SLEEP. 180 tablet 1   losartan (COZAAR) 25 MG tablet Take 0.5 tablets (12.5 mg total) by mouth daily. 90 tablet 3   metoprolol succinate (TOPROL-XL) 25 MG 24 hr tablet Take 1 tablet (25 mg total) by mouth daily. 90 tablet 3   Multiple Vitamins-Minerals (PRESERVISION AREDS  2+MULTI VIT) CAPS Take 1 capsule by mouth daily.     nitroGLYCERIN (NITROSTAT) 0.4 MG SL tablet Place 0.4 mg under the tongue every 5 (five) minutes as needed for chest pain (Call 911 at  3rd dose within 15 minutes.).     ondansetron (ZOFRAN-ODT) 8 MG disintegrating tablet Take 1 tablet (8 mg total) by mouth every 8 (eight) hours as needed for nausea or vomiting. 12 tablet 0   Polyethyl Glycol-Propyl Glycol (LUBRICANT EYE DROPS) 0.4-0.3 % SOLN Place 1-2 drops into both eyes at bedtime.     polyethylene glycol (MIRALAX / GLYCOLAX) 17 g packet Take 17 g by mouth daily as needed for mild constipation.     saccharomyces boulardii (FLORASTOR) 250 MG capsule Take 1 capsule (250 mg total) by mouth 2 (two) times daily. (Patient taking differently: Take 250 mg by mouth daily.) 60 capsule 2   sodium chloride (OCEAN) 0.65 % SOLN nasal spray Place 1 spray into both nostrils at bedtime as needed for congestion.     No current facility-administered medications for this visit.     ALLERGIES: Macrobid [nitrofurantoin monohyd macro], Meloxicam, Digoxin and related, Diprolene [betamethasone dipropionate aug], Ferrous sulfate, Keflex [cephalexin], Mobic [meloxicam], and Sulfamethoxazole-trimethoprim  Family History  Problem Relation Age of Onset   Colon cancer Mother        Dx age 9, died at age 8   Diabetes Father    Prostate cancer Father    Prostate cancer Brother    Pancreatic cancer Brother    Stomach cancer Son    Heart attack Neg Hx    Stroke Neg Hx    Esophageal cancer Neg Hx     Social History   Socioeconomic History   Marital status: Married    Spouse name: [name]   Number of children: 2   Years of education: Not on file   Highest education level: Not on file  Occupational History   Occupation: Patent attorney: RETIRED  Tobacco Use   Smoking status: Former    Packs/day: 0.25    Years: 28.00    Total pack years: 7.00    Types: Cigarettes    Quit date: 1981    Years since  quitting: 42.7   Smokeless tobacco: Never  Vaping Use   Vaping Use: Never used  Substance and Sexual Activity   Alcohol use: Yes    Comment: 7 vodka drinks a week   Drug use: Never   Sexual activity: Not Currently    Birth control/protection: Surgical    Comment: hysterectomy  Other Topics Concern   Not on file  Social History Narrative   Regular Exercise -  YES         Social Determinants of Health   Financial Resource Strain: Low Risk  (02/14/2022)   Overall Financial Resource Strain (CARDIA)    Difficulty of Paying Living Expenses: Not hard at all  Food Insecurity: No Food Insecurity (02/14/2022)   Hunger Vital Sign    Worried About Running Out of Food in the Last Year: Never true    Ran Out of Food in the Last Year: Never true  Transportation Needs: No Transportation Needs (02/14/2022)   PRAPARE - Hydrologist (Medical): No    Lack of Transportation (Non-Medical): No  Physical Activity: Inactive (02/14/2022)   Exercise Vital Sign    Days of Exercise per Week: 0 days    Minutes of Exercise per Session: 0 min  Stress: No Stress Concern Present (02/14/2022)   [place]    Feeling of Stress : Only a little  Social Connections: Moderately Integrated (02/14/2022)   Social Connection and Isolation Panel [  NHANES]    Frequency of Communication with Friends and Family: More than three times a week    Frequency of Social Gatherings with Friends and Family: Three times a week    Attends Religious Services: More than 4 times per year    Active Member of Clubs or Organizations: No    Attends Archivist Meetings: Never    Marital Status: Married  Human resources officer Violence: Not At Risk (02/14/2022)   Humiliation, Afraid, Rape, and Kick questionnaire    Fear of Current or Ex-Partner: No    Emotionally Abused: No    Physically Abused: No    Sexually Abused: No    Review of Systems  All  other systems reviewed and are negative.   PHYSICAL EXAMINATION:    BP 110/70   Ht 5' 2.75" (1.594 m)   Wt 143 lb (64.9 kg)   BMI 25.53 kg/m     General appearance: alert, cooperative and appears stated age   Pelvic: External genitalia:  Suture present in left labia minora and was removed today.  The left labia minora has thinning of the epithelium of the vulva at the biopsy sites.              Urethra:  normal appearing urethra with no masses, tenderness or lesions             Chaperone was present for exam:Margrette Wynia, CMA  ASSESSMENT  Chronic vulvitis.  Lichen sclerosus.  VIN I.  Recent discontinuation of Jardiance.   PLAN  Patient will continue Lidocaine ointment as a rescue medication as needed.  Continue Aquafor.  She will avoid using the Clobetasol for now.  I do have some concern that this may have been overused and actually could be contributing to her symptoms of irritation.  I did discuss Gabapentin 6% compounded cream to treat irritation.  No Rx was given.  I also clarified that this does not treat vulvar dysplasia.  I recommend office visits every 6 month for a vulvar check and in between as needed. Questions invited and answered.  33 min  total time was spent for this patient encounter, including preparation, face-to-face counseling with the patient, coordination of care, and documentation of the encounter.    An After Visit Summary was printed and given to the patient.

## 2022-05-29 ENCOUNTER — Encounter: Payer: Self-pay | Admitting: Obstetrics and Gynecology

## 2022-05-29 ENCOUNTER — Other Ambulatory Visit: Payer: Self-pay

## 2022-05-29 ENCOUNTER — Inpatient Hospital Stay: Payer: Medicare Other

## 2022-05-29 ENCOUNTER — Ambulatory Visit (INDEPENDENT_AMBULATORY_CARE_PROVIDER_SITE_OTHER): Payer: Medicare Other | Admitting: Obstetrics and Gynecology

## 2022-05-29 ENCOUNTER — Inpatient Hospital Stay: Payer: Medicare Other | Attending: Hematology | Admitting: Hematology

## 2022-05-29 ENCOUNTER — Other Ambulatory Visit: Payer: Self-pay | Admitting: Interventional Cardiology

## 2022-05-29 VITALS — BP 110/70 | Ht 62.75 in | Wt 143.0 lb

## 2022-05-29 VITALS — BP 130/77 | HR 56 | Temp 98.1°F | Resp 16 | Ht 62.75 in | Wt 142.9 lb

## 2022-05-29 DIAGNOSIS — E538 Deficiency of other specified B group vitamins: Secondary | ICD-10-CM | POA: Diagnosis not present

## 2022-05-29 DIAGNOSIS — D509 Iron deficiency anemia, unspecified: Secondary | ICD-10-CM | POA: Diagnosis not present

## 2022-05-29 DIAGNOSIS — Z9181 History of falling: Secondary | ICD-10-CM | POA: Diagnosis not present

## 2022-05-29 DIAGNOSIS — N900 Mild vulvar dysplasia: Secondary | ICD-10-CM

## 2022-05-29 DIAGNOSIS — N763 Subacute and chronic vulvitis: Secondary | ICD-10-CM

## 2022-05-29 DIAGNOSIS — Z79899 Other long term (current) drug therapy: Secondary | ICD-10-CM | POA: Diagnosis not present

## 2022-05-29 LAB — CBC WITH DIFFERENTIAL (CANCER CENTER ONLY)
Abs Immature Granulocytes: 0.01 10*3/uL (ref 0.00–0.07)
Basophils Absolute: 0 10*3/uL (ref 0.0–0.1)
Basophils Relative: 1 %
Eosinophils Absolute: 0.5 10*3/uL (ref 0.0–0.5)
Eosinophils Relative: 9 %
HCT: 34.5 % — ABNORMAL LOW (ref 36.0–46.0)
Hemoglobin: 11.1 g/dL — ABNORMAL LOW (ref 12.0–15.0)
Immature Granulocytes: 0 %
Lymphocytes Relative: 21 %
Lymphs Abs: 1.2 10*3/uL (ref 0.7–4.0)
MCH: 31.5 pg (ref 26.0–34.0)
MCHC: 32.2 g/dL (ref 30.0–36.0)
MCV: 98 fL (ref 80.0–100.0)
Monocytes Absolute: 0.5 10*3/uL (ref 0.1–1.0)
Monocytes Relative: 9 %
Neutro Abs: 3.6 10*3/uL (ref 1.7–7.7)
Neutrophils Relative %: 60 %
Platelet Count: 200 10*3/uL (ref 150–400)
RBC: 3.52 MIL/uL — ABNORMAL LOW (ref 3.87–5.11)
RDW: 17.2 % — ABNORMAL HIGH (ref 11.5–15.5)
WBC Count: 5.9 10*3/uL (ref 4.0–10.5)
nRBC: 0 % (ref 0.0–0.2)

## 2022-05-29 LAB — CMP (CANCER CENTER ONLY)
ALT: 7 U/L (ref 0–44)
AST: 18 U/L (ref 15–41)
Albumin: 4 g/dL (ref 3.5–5.0)
Alkaline Phosphatase: 66 U/L (ref 38–126)
Anion gap: 4 — ABNORMAL LOW (ref 5–15)
BUN: 28 mg/dL — ABNORMAL HIGH (ref 8–23)
CO2: 24 mmol/L (ref 22–32)
Calcium: 8.8 mg/dL — ABNORMAL LOW (ref 8.9–10.3)
Chloride: 108 mmol/L (ref 98–111)
Creatinine: 1.15 mg/dL — ABNORMAL HIGH (ref 0.44–1.00)
GFR, Estimated: 47 mL/min — ABNORMAL LOW (ref >60)
Glucose, Bld: 115 mg/dL — ABNORMAL HIGH (ref 70–99)
Potassium: 4.3 mmol/L (ref 3.5–5.1)
Sodium: 136 mmol/L (ref 135–145)
Total Bilirubin: 0.7 mg/dL (ref 0.3–1.2)
Total Protein: 6.5 g/dL (ref 6.5–8.1)

## 2022-05-29 LAB — FERRITIN: Ferritin: 173 ng/mL (ref 11–307)

## 2022-05-29 LAB — IRON AND IRON BINDING CAPACITY (CC-WL,HP ONLY)
Iron: 101 ug/dL (ref 28–170)
Saturation Ratios: 34 % — ABNORMAL HIGH (ref 10.4–31.8)
TIBC: 297 ug/dL (ref 250–450)
UIBC: 196 ug/dL (ref 148–442)

## 2022-05-29 NOTE — Progress Notes (Signed)
HEMATOLOGY/ONCOLOGY CLINIC NOTE  Date of Service: 05/29/2022  Patient Care Team: Cassandria Anger, MD as PCP - General Tamala Julian Lynnell Dike, MD as PCP - Cardiology (Cardiology) Vickie Epley, MD as PCP - Electrophysiology (Cardiology) Irene Shipper, MD as Consulting Physician (Gastroenterology) Darleen Crocker, MD as Consulting Physician (Ophthalmology) Szabat, Darnelle Maffucci, [facility] (Inactive) as Pharmacist (Pharmacist) Brunetta Genera, MD as Consulting Physician (Hematology)  CHIEF COMPLAINTS/PURPOSE OF CONSULTATION:  Management of iron deficiency anemia  HISTORY OF PRESENTING ILLNESS:  Please see previous note for details on initial presentation  INTERVAL HISTORY: Hailey [name] is a 87 y.o. female with history of mild factor XI  deficiency here for evaluation and management of iron deficiency anemia. She reports She is doing well with no new symptoms or concerns.  She notes a couple recent falls. She notes some lightheadedness or dizziness with these previous episodes and after her PRBC transfusion she notes fewer symptoms and better balance.  She notes improving energy levels. She reports more strength after working with physical therapy.   She is tolerating the IV Injectafer well with no prohibitive toxicities.  No new or abnormal bleeding. No melena no hematochezia. No other new or acute focal symptoms.  Labs done today were reviewed in detail.  MEDICAL HISTORY:  Past Medical History:  Diagnosis Date   Acute blood loss anemia 12/25/2017   Allergic rhinitis    Anxiety    Arthritis    "my whole spine" (07/01/2017)   Atrial fibrillation (HCC)    Bowel obstruction (HCC)    in [place]   Bradycardia with 41-50 beats per minute 12/27/2017   Cancer ([place])    Cellulitis of leg, right with large prepatella hematoma and open wounds 12/26/2017   CKD (chronic kidney disease) stage 2, GFR 60-89 ml/min    Colon polyps    Coronary artery disease    10/18 PCI/DES to p/m  LCx with cutting balloon to mLcx   Diverticulosis of colon    GERD (gastroesophageal reflux disease)    Hip bursitis 2010   Dr Para March, Post op seroma   History of colon polyps    HSV (herpes simplex virus) anogenital infection 07/2019   HTN (hypertension)    IBS (irritable bowel syndrome)    constipation predominant - Dr Earlean Shawl   Lichen sclerosus    Osteopenia 11/2016   T score -2.0 FRAX 15%/4.3%   PAC (premature atrial contraction)    Symptomatiic   Renal insufficiency    Right bundle branch block (RBBB) on electrocardiogram (ECG) 12/27/2017   Scoliosis    SVT (supraventricular tachycardia) ([place])    brief history   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   biopsy showing vulvar atypia, possible VIN I    SURGICAL HISTORY: Past Surgical History:  Procedure Laterality Date   ANTERIOR AND POSTERIOR VAGINAL REPAIR  01/2002   Archie Endo 01/23/2011   APPENDECTOMY  1948   CARDIAC CATHETERIZATION  06/26/2017   CORONARY ANGIOPLASTY WITH STENT PLACEMENT  07/01/2017   CORONARY ATHERECTOMY N/A 07/01/2017   Procedure: CORONARY ATHERECTOMY;  Surgeon: Belva Crome, MD;  Location: [facility];  Service: Cardiovascular;  Laterality: N/A;   CORONARY STENT INTERVENTION N/A 07/01/2017   Procedure: CORONARY STENT INTERVENTION;  Surgeon: Belva Crome, MD;  Location: [place] [facility];  Service: Cardiovascular;  Laterality: N/A;   HAMMER TOE SURGERY     HEMORRHOID BANDING     HIP SURGERY Left 04/2009   hip examination under anesthesia  followed by greater trochanteric bursectomy; iliotibial band tenotomy/notes 01/20/2011   I & D EXTREMITY Right 01/10/2018   Procedure: IRRIGATION AND DEBRIDEMENT RIGHT KNEE, APPLY WOUND VAC;  Surgeon: Newt Minion, MD;  Location: [facility];  Service: Orthopedics;  Laterality: Right;   KNEE BURSECTOMY Right 04/2009   Archie Endo 01/09/2011   LEFT ATRIAL APPENDAGE OCCLUSION N/A 08/24/2021   Procedure: LEFT ATRIAL APPENDAGE OCCLUSION;  Surgeon: Sherren Mocha,  MD;  Location: [place] [facility];  Service: Cardiovascular;  Laterality: N/A;   LEFT HEART CATH AND CORONARY ANGIOGRAPHY N/A 06/26/2017   Procedure: LEFT HEART CATH AND CORONARY ANGIOGRAPHY;  Surgeon: Belva Crome, MD;  Location: [place] [facility];  Service: Cardiovascular;  Laterality: N/A;   PUBOVAGINAL SLING  01/2002   Archie Endo 01/23/2011   REDUCTION MAMMAPLASTY     TEE WITHOUT CARDIOVERSION N/A 08/24/2021   Procedure: TRANSESOPHAGEAL ECHOCARDIOGRAM (TEE);  Surgeon: Sherren Mocha, MD;  Location: [place];  Service: Cardiovascular;  Laterality: N/A;   TEMPORARY PACEMAKER N/A 07/01/2017   Procedure: TEMPORARY PACEMAKER;  Surgeon: Belva Crome, MD;  Location: [facility];  Service: Cardiovascular;  Laterality: N/A;   VAGINAL HYSTERECTOMY  01/2002   Vaginal hysterectomy, bilateral salpingo-oophorectomy/notes 01/23/2011    SOCIAL HISTORY: Social History   Socioeconomic History   Marital status: Married    Spouse name: [name]   Number of children: 2   Years of education: Not on file   Highest education level: Not on file  Occupational History   Occupation: Patent attorney: RETIRED  Tobacco Use   Smoking status: Former    Packs/day: 0.25    Years: 28.00    Total pack years: 7.00    Types: Cigarettes    Quit date: 1981    Years since quitting: 42.7   Smokeless tobacco: Never  Vaping Use   Vaping Use: Never used  Substance and Sexual Activity   Alcohol use: Yes    Comment: 7 vodka drinks a week   Drug use: Never   Sexual activity: Not Currently    Birth control/protection: Surgical    Comment: hysterectomy  Other Topics Concern   Not on file  Social History Narrative   Regular Exercise -  YES         Social Determinants of Health   Financial Resource Strain: Low Risk  (02/14/2022)   Overall Financial Resource Strain (CARDIA)    Difficulty of Paying Living Expenses: Not hard at all  Food Insecurity: No Food Insecurity (02/14/2022)   Hunger  Vital Sign    Worried About Running Out of Food in the Last Year: Never true    Ran Out of Food in the Last Year: Never true  Transportation Needs: No Transportation Needs (02/14/2022)   PRAPARE - Hydrologist (Medical): No    Lack of Transportation (Non-Medical): No  Physical Activity: Inactive (02/14/2022)   Exercise Vital Sign    Days of Exercise per Week: 0 days    Minutes of Exercise per Session: 0 min  Stress: No Stress Concern Present (02/14/2022)   [place]    Feeling of Stress : Only a little  Social Connections: Moderately Integrated (02/14/2022)   Social Connection and Isolation Panel [NHANES]    Frequency of Communication with Friends and Family: More than three times a week    Frequency of Social Gatherings with Friends and Family: Three times a week  Attends Religious Services: More than 4 times per year    Active Member of Clubs or Organizations: No    Attends Archivist Meetings: Never    Marital Status: Married  Human resources officer Violence: Not At Risk (02/14/2022)   Humiliation, Afraid, Rape, and Kick questionnaire    Fear of Current or Ex-Partner: No    Emotionally Abused: No    Physically Abused: No    Sexually Abused: No    FAMILY HISTORY: Family History  Problem Relation Age of Onset   Colon cancer Mother        Dx age 32, died at age 53   Diabetes Father    Prostate cancer Father    Prostate cancer Brother    Pancreatic cancer Brother    Stomach cancer Son    Heart attack Neg Hx    Stroke Neg Hx    Esophageal cancer Neg Hx     ALLERGIES:  is allergic to macrobid [nitrofurantoin monohyd macro], meloxicam, digoxin and related, diprolene [betamethasone dipropionate aug], ferrous sulfate, keflex [cephalexin], mobic [meloxicam], and sulfamethoxazole-trimethoprim.  MEDICATIONS:  Current Outpatient Medications  Medication Sig Dispense Refill   acetaminophen  (TYLENOL) 500 MG tablet Take 1,000 mg by mouth every 6 (six) hours as needed (body aches / sore hip). Maximum of 6 tablets daily ('3000mg'$ )     apixaban (ELIQUIS) 2.5 MG TABS tablet TAKE 1 TABLET BY MOUTH TWICE  DAILY 200 tablet 1   atorvastatin (LIPITOR) 40 MG tablet Take 1 tablet (40 mg total) by mouth every Monday, Wednesday, and Friday. 45 tablet 3   BIOTIN PO Take 5,000 mcg by mouth in the morning.     Cholecalciferol (VITAMIN D3) 50 MCG (2000 UT) capsule Take 1 capsule (2,000 Units total) by mouth daily. 100 capsule 3   Cyanocobalamin (VITAMIN B-12) 1000 MCG SUBL Place 1 tablet (1,000 mcg total) under the tongue daily. 100 tablet 3   diclofenac Sodium (VOLTAREN) 1 % GEL Apply 2 g topically 4 (four) times daily as needed (back pain.).     dicyclomine (BENTYL) 20 MG tablet Take 1 by mouth every 4-6 hours as needed for cramping 30 tablet 3   DULoxetine (CYMBALTA) 20 MG capsule Take 40 mg x 1 week, then 20 mg/day 90 capsule 3   esomeprazole (NEXIUM) 40 MG capsule TAKE ONE CAPSULE ONCE DAILY. (Patient taking differently: Take 40 mg by mouth daily.) 90 capsule 1   Homeopathic Products (ARNICARE) GEL Apply 1 application topically daily as needed (skin bruising).     hyoscyamine (LEVSIN) 0.125 MG tablet TAKE 1-2 TABLETS (0.125-0.25 MG) BY MOUTH EVERY 4 HOURS AS NEEDED FOR UP TO 10 DAYS FOR CRAMPING. (Patient taking differently: Take 0.125-0.25 mg by mouth every 4 (four) hours as needed for cramping.) 100 tablet 1   lidocaine (XYLOCAINE) 5 % ointment Apply 1 Application topically as needed. Apply to the area twice a day to three times if the vulvar area is painful. 35.44 g 0   LORazepam (ATIVAN) 1 MG tablet TAKE ONE TABLET BY MOUTH TWICE DAILY AS NEEDED FOR ANXIETY / SLEEP. 180 tablet 1   losartan (COZAAR) 25 MG tablet Take 0.5 tablets (12.5 mg total) by mouth daily. 90 tablet 3   metoprolol succinate (TOPROL-XL) 25 MG 24 hr tablet Take 1 tablet (25 mg total) by mouth daily. (Patient taking differently:  Take 12.5 mg by mouth daily.) 90 tablet 3   Multiple Vitamins-Minerals (PRESERVISION AREDS 2+MULTI VIT) CAPS Take 1 capsule by mouth daily.  ondansetron (ZOFRAN-ODT) 8 MG disintegrating tablet Take 1 tablet (8 mg total) by mouth every 8 (eight) hours as needed for nausea or vomiting. 12 tablet 0   Polyethyl Glycol-Propyl Glycol (LUBRICANT EYE DROPS) 0.4-0.3 % SOLN Place 1-2 drops into both eyes at bedtime.     polyethylene glycol (MIRALAX / GLYCOLAX) 17 g packet Take 17 g by mouth daily as needed for mild constipation.     saccharomyces boulardii (FLORASTOR) 250 MG capsule Take 1 capsule (250 mg total) by mouth 2 (two) times daily. (Patient taking differently: Take 250 mg by mouth daily.) 60 capsule 2   sodium chloride (OCEAN) 0.65 % SOLN nasal spray Place 1 spray into both nostrils at bedtime as needed for congestion.     spironolactone (ALDACTONE) 25 MG tablet Take 25 mg by mouth daily. Taking Mon, Wed and Fri     betamethasone dipropionate (DIPROLENE) 0.05 % ointment Apply 2 times a day to affected areas (Patient not taking: Reported on 05/29/2022)     nitroGLYCERIN (NITROSTAT) 0.4 MG SL tablet Place 0.4 mg under the tongue every 5 (five) minutes as needed for chest pain (Call 911 at 3rd dose within 15 minutes.).     No current facility-administered medications for this visit.    REVIEW OF SYSTEMS:    10 Point review of Systems was done is negative except as noted above.  PHYSICAL EXAMINATION:  .BP 130/77 (BP Location: Left Arm, Patient Position: Sitting)   Pulse (!) 56   Temp 98.1 F (36.7 C) (Temporal)   Resp 16   Ht 5' 2.75" (1.594 m)   Wt 142 lb 14.4 oz (64.8 kg)   SpO2 96%   BMI 25.52 kg/m  NAD GENERAL:alert, in no acute distress and comfortable SKIN: no acute rashes, no significant lesions EYES: conjunctiva are pink and non-injected, sclera anicteric NECK: supple, no JVD LYMPH:  no palpable lymphadenopathy in the cervical, axillary or inguinal regions LUNGS: clear to  auscultation b/l with normal respiratory effort HEART: regular rate & rhythm ABDOMEN:  normoactive bowel sounds , non tender, not distended. Extremity: no pedal edema PSYCH: alert & oriented x 3 with fluent speech NEURO: no focal motor/sensory deficits  LABORATORY DATA:  I have reviewed the data as listed  .    Latest Ref Rng & Units 05/29/2022    2:38 PM 05/03/2022    3:33 PM 04/27/2022    6:23 AM  CBC  WBC 4.0 - 10.5 K/uL 5.9  7.9    Hemoglobin 12.0 - 15.0 g/dL 11.1  8.2 Repeated and verified X2.  8.3   Hematocrit 36.0 - 46.0 % 34.5  25.1  24.3   Platelets 150 - 400 K/uL 200  344.0      .    Latest Ref Rng & Units 05/29/2022    2:38 PM 05/03/2022    3:33 PM 04/27/2022    6:23 AM  CMP  Glucose 70 - 99 mg/dL 115  100  105   BUN 8 - 23 mg/dL '28  21  14   '$ Creatinine 0.44 - 1.00 mg/dL 1.15  1.03  0.88   Sodium 135 - 145 mmol/L 136  137  135   Potassium 3.5 - 5.1 mmol/L 4.3  4.4  3.9   Chloride 98 - 111 mmol/L 108  104  112   CO2 22 - 32 mmol/L '24  25  19   '$ Calcium 8.9 - 10.3 mg/dL 8.8  8.6  8.4   Total Protein 6.5 - 8.1 g/dL 6.5  6.5    Total Bilirubin 0.3 - 1.2 mg/dL 0.7  0.9    Alkaline Phos 38 - 126 U/L 66  66    AST 15 - 41 U/L 18  21    ALT 0 - 44 U/L 7  12     Component     Latest Ref Rng & Units 12/02/2019  Prothrombin Time     11.4 - 15.2 seconds 13.9  INR     0.8 - 1.2 1.1  Factor XI Activity     60 - 150 % 48 (L)  APTT     24 - 36 seconds 52 (H)  . Lab Results  Component Value Date   IRON 67 02/14/2022   TIBC 323 02/14/2022   IRONPCTSAT 21 02/14/2022   (Iron and TIBC)  Lab Results  Component Value Date   FERRITIN 157 02/14/2022     RADIOGRAPHIC STUDIES: I have personally reviewed the radiological images as listed and agreed with the findings in the report. No results found.  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 Mild with FXI activity levels @ 48%   2) Iron  deficiency anemia Likely from GI losses.  Patient recently had acute diverticulitis. Celiac disease has been ruled out previously. -Labs done 02/14/2022 show Iron saturation of 21%.  CBC with hemoglobin of 11.6 with an MCV of 89.5. -Iron labs done today are stable and WNL.   PLAN: -Labs done today were reviewed in detail. Iron saturation was noted to be 21%.  CBC with hemoglobin of 11.1 with an MCV of 98. Ferritin WNL. 173 Iron sat ratio is 34%. No indication for additional IV Iron at this time. CMP shows elevated BUN of 28 consistent with dehydration. Elevated creatinine of 1.15 and reduced GFR est of 47 consistent with chronic kidney disease. -She continues to follow up with gastroenterology Dr. Scarlette Shorts MD for optimization of GI treatment and management of symptoms. -Recommended she start taking B complex 1 capsule p.o. daily as well given her B vitamin deficiency.  FOLLOW UP: RTC with Dr Irene Limbo with labs in 6 months   The total time spent in the appointment was 20 minutes*.  All of the patient's questions were answered with apparent satisfaction. The patient knows to call the clinic with any problems, questions or concerns.   Sullivan Lone MD MS AAHIVMS [facility] Hematology/Oncology Physician [facility]  .*Total Encounter Time as defined by the Centers for Medicare and Medicaid Services includes, in addition to the face-to-face time of a patient visit (documented in the note above) non-face-to-face time: obtaining and reviewing outside history, ordering and reviewing medications, tests or procedures, care coordination (communications with other health care professionals or caregivers) and documentation in the medical record.  I, Melene Muller, am acting as scribe for Dr. Sullivan Lone, MD.  .I have reviewed the above documentation for accuracy and completeness, and I agree with the above.Brunetta Genera MD

## 2022-05-30 ENCOUNTER — Ambulatory Visit: Payer: Medicare Other | Admitting: Physical Therapy

## 2022-05-30 ENCOUNTER — Encounter: Payer: Self-pay | Admitting: Physical Therapy

## 2022-05-30 DIAGNOSIS — R2681 Unsteadiness on feet: Secondary | ICD-10-CM

## 2022-05-30 DIAGNOSIS — R2689 Other abnormalities of gait and mobility: Secondary | ICD-10-CM | POA: Diagnosis not present

## 2022-05-30 DIAGNOSIS — L570 Actinic keratosis: Secondary | ICD-10-CM | POA: Diagnosis not present

## 2022-05-30 DIAGNOSIS — M6281 Muscle weakness (generalized): Secondary | ICD-10-CM

## 2022-05-30 DIAGNOSIS — D225 Melanocytic nevi of trunk: Secondary | ICD-10-CM | POA: Diagnosis not present

## 2022-05-30 DIAGNOSIS — L578 Other skin changes due to chronic exposure to nonionizing radiation: Secondary | ICD-10-CM | POA: Diagnosis not present

## 2022-05-30 DIAGNOSIS — L821 Other seborrheic keratosis: Secondary | ICD-10-CM | POA: Diagnosis not present

## 2022-05-30 NOTE — Therapy (Signed)
OUTPATIENT PHYSICAL THERAPY TREATMENT NOTE   Patient Name: Hailey [name] MRN: [id number] DOB:December 18, 1934, 87 y.o., female Today's Date: 05/30/2022  PCP: Cassandria Anger, MD   REFERRING PROVIDER: Stefanie Libel, MD   PT End of Session - 05/30/22 1215     Visit Number 3    Number of Visits --   1-2x/week   Date for PT Re-Evaluation 07/12/22    Authorization Type [employer] [place]    Progress Note Due on Visit 10    PT Start Time 1215    PT Stop Time 9983    PT Time Calculation (min) 41 min             Past Medical History:  Diagnosis Date   Acute blood loss anemia 12/25/2017   Allergic rhinitis    Anxiety    Arthritis    "my whole spine" (07/01/2017)   Atrial fibrillation (HCC)    Bowel obstruction (HCC)    in [place]   Bradycardia with 41-50 beats per minute 12/27/2017   Cancer ([place])    Cellulitis of leg, right with large prepatella hematoma and open wounds 12/26/2017   CKD (chronic kidney disease) stage 2, GFR 60-89 ml/min    Colon polyps    Coronary artery disease    10/18 PCI/DES to p/m LCx with cutting balloon to mLcx   Diverticulosis of colon    GERD (gastroesophageal reflux disease)    Hip bursitis 2010   Dr Para March, Post op seroma   History of colon polyps    HSV (herpes simplex virus) anogenital infection 07/2019   HTN (hypertension)    IBS (irritable bowel syndrome)    constipation predominant - Dr Earlean Shawl   Lichen sclerosus    Osteopenia 11/2016   T score -2.0 FRAX 15%/4.3%   PAC (premature atrial contraction)    Symptomatiic   Renal insufficiency    Right bundle branch block (RBBB) on electrocardiogram (ECG) 12/27/2017   Scoliosis    SVT (supraventricular tachycardia) ([place])    brief history   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   biopsy showing vulvar atypia, possible VIN I   Past Surgical History:  Procedure Laterality Date   ANTERIOR AND POSTERIOR VAGINAL REPAIR  01/2002   Archie Endo 01/23/2011   APPENDECTOMY  1948   CARDIAC  CATHETERIZATION  06/26/2017   CORONARY ANGIOPLASTY WITH STENT PLACEMENT  07/01/2017   CORONARY ATHERECTOMY N/A 07/01/2017   Procedure: CORONARY ATHERECTOMY;  Surgeon: Belva Crome, MD;  Location: [facility];  Service: Cardiovascular;  Laterality: N/A;   CORONARY STENT INTERVENTION N/A 07/01/2017   Procedure: CORONARY STENT INTERVENTION;  Surgeon: Belva Crome, MD;  Location: [facility];  Service: Cardiovascular;  Laterality: N/A;   HAMMER TOE SURGERY     HEMORRHOID BANDING     HIP SURGERY Left 04/2009   hip examination under anesthesia followed by greater trochanteric bursectomy; iliotibial band tenotomy/notes 01/20/2011   I & D EXTREMITY Right 01/10/2018   Procedure: IRRIGATION AND DEBRIDEMENT RIGHT KNEE, APPLY WOUND VAC;  Surgeon: Newt Minion, MD;  Location: [facility];  Service: Orthopedics;  Laterality: Right;   KNEE BURSECTOMY Right 04/2009   Archie Endo 01/09/2011   LEFT ATRIAL APPENDAGE OCCLUSION N/A 08/24/2021   Procedure: LEFT ATRIAL APPENDAGE OCCLUSION;  Surgeon: Sherren Mocha, MD;  Location: [facility];  Service: Cardiovascular;  Laterality: N/A;   LEFT HEART CATH AND CORONARY ANGIOGRAPHY N/A 06/26/2017   Procedure: LEFT HEART CATH AND CORONARY ANGIOGRAPHY;  Surgeon: Belva Crome, MD;  Location: [facility];  Service: Cardiovascular;  Laterality: N/A;   PUBOVAGINAL SLING  01/2002   Archie Endo 01/23/2011   REDUCTION MAMMAPLASTY     TEE WITHOUT CARDIOVERSION N/A 08/24/2021   Procedure: TRANSESOPHAGEAL ECHOCARDIOGRAM (TEE);  Surgeon: Sherren Mocha, MD;  Location: [facility];  Service: Cardiovascular;  Laterality: N/A;   TEMPORARY PACEMAKER N/A 07/01/2017   Procedure: TEMPORARY PACEMAKER;  Surgeon: Belva Crome, MD;  Location: [facility];  Service: Cardiovascular;  Laterality: N/A;   VAGINAL HYSTERECTOMY  01/2002   Vaginal hysterectomy, bilateral salpingo-oophorectomy/notes 01/23/2011   Patient Active Problem List   Diagnosis Date Noted    Hematoma of right thigh 04/25/2022   Weakness 04/25/2022   Weakness generalized 04/25/2022   Falls frequently 04/19/2022   Iron deficiency anemia 12/08/2021   Herpesviral infection, unspecified 11/22/2021   Skin ulcer, limited to breakdown of skin ([place]) 11/22/2021   Anemia, iron deficiency 11/22/2021   Low serum vitamin B12 11/22/2021   Acute renal failure superimposed on stage 2 chronic kidney disease ([place]) 11/20/2021   ABLA (acute blood loss anemia) 11/20/2021   UTI (urinary tract infection) 11/20/2021   Acute diverticulitis 11/20/2021   Hypotension 11/19/2021   Shortness of breath 06/12/2021   Right leg pain 02/21/2021   Diverticulitis 01/08/2021   Acute respiratory failure with hypoxia (HCC)    Bacterial colitis    Abnormal CT of the abdomen    Diarrhea of infectious origin    Acute colitis 08/27/2020   Aortic atherosclerosis ([place]) 08/27/2020   Acute prerenal azotemia 08/27/2020   Shock circulatory ([place]) 60/63/0160   Metabolic acidosis with normal anion gap and bicarbonate losses 08/27/2020   Dehydration    Squamous cell cancer of skin of left cheek 10/93/2355   Lichen sclerosus 73/22/0254   Abdominal cramping 03/16/2020   Neurogenic orthostatic hypotension ([place]) 09/16/2019   Injury of face 08/31/2019   Leg abrasion, infected, right, subsequent encounter 08/28/2019   Face lacerations 08/26/2019   Nose fracture 08/26/2019   Concussion 08/26/2019   Wrist pain, acute, right 08/26/2019   Greater trochanteric pain syndrome of right lower extremity 07/30/2019   Viral URI with cough 08/15/2018   Abdominal pain 05/22/2018   Traumatic hematoma of right knee 01/10/2018   Ulcer of right knee (HCC)    Bradycardia with 41-50 beats per minute 12/27/2017   Right bundle branch block (RBBB) on electrocardiogram (ECG) 12/27/2017   Quadriceps tendon rupture 12/24/2017   Chronic kidney disease, stage 3b ([place]) 12/24/2017   Cellulitis of leg, right with large prepatella hematoma and open  wounds 12/24/2017   Iliotibial band syndrome of right side 12/16/2017   Depression 07/11/2017   Intervertebral lumbar disc disorder with myelopathy, lumbar region 04/25/2017   Family history of colon cancer in mother 01/22/2017   Primary osteoarthritis of both first carpometacarpal joints 01/02/2017   Pain 11/21/2016   Gastroenteritis 09/13/2016   Chronic anticoagulation 05/24/2015   Coronary artery disease involving native coronary artery of native heart with angina pectoris ([place]) 12/20/2014   Iliotibial band syndrome of left side 11/16/2014   Diverticulitis of colon 08/30/2014   Chest pain, atypical 07/03/2014   Diarrhea 12/22/2013   Permanent atrial fibrillation ([place]) 11/21/2012   Greater trochanteric bursitis of both hips 05/21/2012   Dizzinesses 11/07/2011   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   Osteoarthritis of left hip 07/03/2010   DEGENERATIVE DISC DISEASE, LUMBOSACRAL SPINE 05/18/2010   SYNCOPE 10/27/2008   Essential hypertension 06/16/2007  GERD (gastroesophageal reflux disease) 06/16/2007   COLONIC POLYPS, HX OF 06/16/2007    THERAPY DIAG:  Unsteadiness on feet  Muscle weakness  Other abnormalities of gait and mobility  REFERRING DIAG: Balance problem [R26.89], Fall on same level from slipping, tripping or stumbling, initial encounter [W01.0XXA]  PERTINENT HISTORY: Afib, bradycardia, hypotension  PRECAUTIONS/RESTRICTIONS:   Fall  SUBJECTIVE:  Corky reports that her back is bothering her today.  This is a common occurrence.  She thinks she may have agg'd it in PT.  Pain:  Are you having pain? Yes Pain location: Hematoma R thigh NPRS scale:  current 4/10  Stability: getting better  OBJECTIVE: (objective measures completed at initial evaluation unless otherwise dated)  DIAGNOSTIC FINDINGS:  IMPRESSION: 1. No acute fracture or dislocation. 2. Subcutaneous fat stranding over the lateral aspect of the right hip and thigh compatible with contusion.  There are hyperdense collections lateral to the right hip, the largest measuring 15.0 x 7.1 x 8.0 cm suggesting hematoma given history of falls.             GENERAL OBSERVATION/GAIT:                     Arrives in manual WC, glute weakness compensation with posterior w/s in gait   SENSATION:          Light touch: Appears intact   PALPATION: TTP R lateral thigh and L knee   LE MMT:   MMT Right 05/17/2022 Left 05/17/2022  Hip flexion (L2, L3) 3+ 3+  Knee extension (L3) 3+ 3+  Knee flexion 3+ 3+  Hip abduction 3 3+  Hip extension      Hip external rotation      Hip internal rotation      Hip adduction      Ankle dorsiflexion (L4)      Ankle plantarflexion (S1)      Ankle inversion      Ankle eversion      Great Toe ext (L5)      Grossly        (Blank rows = not tested, score listed is out of 5 possible points.  N = WNL, D = diminished, C = clear for gross weakness with myotome testing, * = concordant pain with testing)   Functional Tests   Eval (05/17/2022)      BERG BALANCE TEST Sitting to Standing: 4.      Stands without using hands and stabilize independently Standing Unsupported: 4.      Stands safely for 2 minutes Sitting Unsupported: 4.     Sits for 2 minutes independently Standing to Sitting: 3.     Controls descent with hands  Transfers: 3.     Transfers safely definite use of hands Standing with eyes closed: 4.     Stands safely for 10 seconds  Standing with feet together: 4.     Stands for 1 minute safely Reaching forward with outstretched arm: 3.     Reaches forward 5 inches Retrieving object from the floor: 3.     Able to pick up with supervision Turning to look behind: 3.     Looks behind one side only, other side less weight shift Turning 360 degrees: 2.     Able to turn slowly, but safely Place alternate foot on stool: 3.     Completes 8 steps in >20 seconds Standing with one foot in front: 4.     Independent tandem for 30 seconds  Standing on one foot: 0.      Unable   Total Score: 44/56        30'' STS: 9x  UE used? n        10 m max gait speed: 10'', 1 m/s, AD: n                                                                                         PATIENT SURVEYS:  FOTO 45 -> 52     TODAY'S TREATMENT: Creating, reviewing, and completing below HEP     PATIENT EDUCATION:  POC, diagnosis, prognosis, HEP, and outcome measures.  Pt educated via explanation, demonstration, and handout (HEP).  Pt confirms understanding verbally.    HOME EXERCISE PROGRAM: Access Code: [email or web address] URL: [email or web address] Date: 05/17/2022 Prepared by: Shearon Balo   Exercises - Bridge  - 1 x daily - 7 x weekly - 3 sets - 10 reps - 3'' hold - Hooklying Isometric Clamshell  - 1 x daily - 7 x weekly - 3 sets - 10 reps - Standing Tandem Balance with Counter Support  - 1 x daily - 7 x weekly - 3 sets - 45'' hold   ASTERISK SIGNS     Asterisk Signs Eval (05/17/2022)            BERG 44            SLS unable            LE MMT 3+ grossly                                              TREATMENT 9/20:  Therapeutic Exercise: - nu-step L5 72mwhile taking subjective and planning session with patient - slant board stretch - 2' - heel raises on step - 3x10 - LTR - 20x - Bridge - 2x10 - alternating clam - GTB - 2x10 - Hip adduction ball squeeze - 3'' squeeze - 2x10 - sit to stand - 3x10 with 2 kg OH press (NT) - Lateral walking with YTB at ankles - 3 laps (NT)  Neuromuscular re-ed: - tandem on foam - 45'' bouts - rocker board fwd/lat - 2' ea  TREATMENT 9/18:  Therapeutic Exercise: - nu-step L5 526mhile taking subjective and planning session with patient - slant board stretch - 2' - heel raises on step - 3x10 - sit to stand - 3x10 with 2 kg OH press - Lateral walking with YTB at ankles - 3 laps  Neuromuscular re-ed: - tandem on foam - 45'' bouts - rocker board fwd/lat - 2' ea     ASSESSMENT:   CLINICAL  IMPRESSION: Corky tolerated session well with no adverse reaction.  We moved to more hip and core strengthening on the mat today d/t pts increased back pain today.  She was able to to complete with good form with min cuing for form and pacing.  Will continue to progress as able.   OBJECTIVE IMPAIRMENTS: Pain, gait, balance, LE strength   ACTIVITY LIMITATIONS: transfers,  housework, ambulation, bending, squatting   PERSONAL FACTORS: See medical history and pertinent history     REHAB POTENTIAL: Good   CLINICAL DECISION MAKING: Stable/uncomplicated   EVALUATION COMPLEXITY: Low     GOALS:     SHORT TERM GOALS: Target date: 06/07/2022   Elspeth will be >75% HEP compliant to improve carryover between sessions and facilitate independent management of condition   Evaluation (05/17/2022): ongoing Goal status: INITIAL     LONG TERM GOALS: Target date: 07/12/2022   Dreyah will improve FOTO score to 52 as a proxy for functional improvement   Evaluation/Baseline (05/17/2022): 45 Goal status: INITIAL     2.  Tasmia will improve 30'' STS (MCID 2) to >/= 11x (w/ UE?: n) to show improved LE strength and improved transfers    Evaluation/Baseline (05/17/2022): 9x  w/ UE? n Goal status: INITIAL     3.  Kristel will improve 10 meter max gait speed to 1.2 m/s (.1 m/s MCID) to show functional improvement in ambulation    Evaluation/Baseline (05/17/2022): 1 m/s Goal status: INITIAL     [place]:        4.  Karyl will imporve BERG balance score to 50 pts, to show a significant improvement in balance in order to reduce fall risk   Evaluation/Baseline (05/17/2022): 44 pts Goal status: INITIAL   See interpretation below:            PLAN: PT FREQUENCY: 1-2x/week   PT DURATION: 8 weeks (Ending 07/12/2022)   PLANNED INTERVENTIONS: Therapeutic exercises, Aquatic therapy, Therapeutic activity, Neuro Muscular re-education, Gait training, Patient/Family education, Joint mobilization, Dry  Needling, Electrical stimulation, Spinal mobilization and/or manipulation, Moist heat, Taping, Vasopneumatic device, Ionotophoresis '4mg'$ /ml Dexamethasone, and Manual therapy   PLAN FOR NEXT SESSION: progressive balance on unstable surfaces, SLS, with head turns, general LE strengthening, gait   Kevan Ny Vansh Reckart PT 05/30/2022, 12:58 PM

## 2022-06-04 ENCOUNTER — Encounter: Payer: Self-pay | Admitting: Hematology

## 2022-06-05 ENCOUNTER — Ambulatory Visit: Payer: Medicare Other

## 2022-06-05 DIAGNOSIS — R2689 Other abnormalities of gait and mobility: Secondary | ICD-10-CM

## 2022-06-05 DIAGNOSIS — M6281 Muscle weakness (generalized): Secondary | ICD-10-CM

## 2022-06-05 DIAGNOSIS — R2681 Unsteadiness on feet: Secondary | ICD-10-CM

## 2022-06-05 NOTE — Therapy (Signed)
OUTPATIENT PHYSICAL THERAPY TREATMENT NOTE   Patient Name: Hailey [name] MRN: [id number] DOB:[birth year], 87 y.o., female Today's Date: 06/05/2022  PCP: Cassandria Anger, MD   REFERRING PROVIDER: Stefanie Libel, MD   PT End of Session - 06/05/22 1215     Visit Number 4    Date for PT Re-Evaluation 07/12/22    Authorization Type [employer] MCR - FOTO    Progress Note Due on Visit 10    PT Start Time 1215    PT Stop Time 2229    PT Time Calculation (min) 40 min    Activity Tolerance Patient tolerated treatment well    Behavior During Therapy [facility] for tasks assessed/performed              Past Medical History:  Diagnosis Date   Acute blood loss anemia 12/25/2017   Allergic rhinitis    Anxiety    Arthritis    "my whole spine" (07/01/2017)   Atrial fibrillation ([place])    Bowel obstruction ([place])    in [place]   Bradycardia with 41-50 beats per minute 12/27/2017   Cancer ([place])    Cellulitis of leg, right with large prepatella hematoma and open wounds 12/26/2017   CKD (chronic kidney disease) stage 2, GFR 60-89 ml/min    Colon polyps    Coronary artery disease    10/18 PCI/DES to p/m LCx with cutting balloon to mLcx   Diverticulosis of colon    GERD (gastroesophageal reflux disease)    Hip bursitis 2010   Dr Para March, Post op seroma   History of colon polyps    HSV (herpes simplex virus) anogenital infection 07/2019   HTN (hypertension)    IBS (irritable bowel syndrome)    constipation predominant - Dr Earlean Shawl   Lichen sclerosus    Osteopenia 11/2016   T score -2.0 FRAX 15%/4.3%   PAC (premature atrial contraction)    Symptomatiic   Renal insufficiency    Right bundle branch block (RBBB) on electrocardiogram (ECG) 12/27/2017   Scoliosis    SVT (supraventricular tachycardia) ([place])    brief history   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   biopsy showing vulvar atypia, possible VIN I   Past Surgical History:  Procedure Laterality Date   ANTERIOR AND  POSTERIOR VAGINAL REPAIR  01/2002   Archie Endo 01/23/2011   APPENDECTOMY  1948   CARDIAC CATHETERIZATION  06/26/2017   CORONARY ANGIOPLASTY WITH STENT PLACEMENT  07/01/2017   CORONARY ATHERECTOMY N/A 07/01/2017   Procedure: CORONARY ATHERECTOMY;  Surgeon: Belva Crome, MD;  Location: [place] [facility];  Service: Cardiovascular;  Laterality: N/A;   CORONARY STENT INTERVENTION N/A 07/01/2017   Procedure: CORONARY STENT INTERVENTION;  Surgeon: Belva Crome, MD;  Location: [place] [facility];  Service: Cardiovascular;  Laterality: N/A;   HAMMER TOE SURGERY     HEMORRHOID BANDING     HIP SURGERY Left 04/2009   hip examination under anesthesia followed by greater trochanteric bursectomy; iliotibial band tenotomy/notes 01/20/2011   I & D EXTREMITY Right 01/10/2018   Procedure: IRRIGATION AND DEBRIDEMENT RIGHT KNEE, APPLY WOUND VAC;  Surgeon: Newt Minion, MD;  Location: [place];  Service: Orthopedics;  Laterality: Right;   KNEE BURSECTOMY Right 04/2009   Archie Endo 01/09/2011   LEFT ATRIAL APPENDAGE OCCLUSION N/A 08/24/2021   Procedure: LEFT ATRIAL APPENDAGE OCCLUSION;  Surgeon: Sherren Mocha, MD;  Location: [facility];  Service: Cardiovascular;  Laterality: N/A;   LEFT HEART CATH AND CORONARY ANGIOGRAPHY N/A  06/26/2017   Procedure: LEFT HEART CATH AND CORONARY ANGIOGRAPHY;  Surgeon: Belva Crome, MD;  Location: [place] [facility];  Service: Cardiovascular;  Laterality: N/A;   PUBOVAGINAL SLING  01/2002   Archie Endo 01/23/2011   REDUCTION MAMMAPLASTY     TEE WITHOUT CARDIOVERSION N/A 08/24/2021   Procedure: TRANSESOPHAGEAL ECHOCARDIOGRAM (TEE);  Surgeon: Sherren Mocha, MD;  Location: [facility];  Service: Cardiovascular;  Laterality: N/A;   TEMPORARY PACEMAKER N/A 07/01/2017   Procedure: TEMPORARY PACEMAKER;  Surgeon: Belva Crome, MD;  Location: [place] [facility];  Service: Cardiovascular;  Laterality: N/A;   VAGINAL HYSTERECTOMY  01/2002   Vaginal hysterectomy, bilateral  salpingo-oophorectomy/notes 01/23/2011   Patient Active Problem List   Diagnosis Date Noted   Hematoma of right thigh 04/25/2022   Weakness 04/25/2022   Weakness generalized 04/25/2022   Falls frequently 04/19/2022   Iron deficiency anemia 12/08/2021   Herpesviral infection, unspecified 11/22/2021   Skin ulcer, limited to breakdown of skin ([place]) 11/22/2021   Anemia, iron deficiency 11/22/2021   Low serum vitamin B12 11/22/2021   Acute renal failure superimposed on stage 2 chronic kidney disease ([place]) 11/20/2021   ABLA (acute blood loss anemia) 11/20/2021   UTI (urinary tract infection) 11/20/2021   Acute diverticulitis 11/20/2021   Hypotension 11/19/2021   Shortness of breath 06/12/2021   Right leg pain 02/21/2021   Diverticulitis 01/08/2021   Acute respiratory failure with hypoxia (HCC)    Bacterial colitis    Abnormal CT of the abdomen    Diarrhea of infectious origin    Acute colitis 08/27/2020   Aortic atherosclerosis ([place]) 08/27/2020   Acute prerenal azotemia 08/27/2020   Shock circulatory ([place]) 10/93/2355   Metabolic acidosis with normal anion gap and bicarbonate losses 08/27/2020   Dehydration    Squamous cell cancer of skin of left cheek 73/22/0254   Lichen sclerosus 27/02/2375   Abdominal cramping 03/16/2020   Neurogenic orthostatic hypotension ([place]) 09/16/2019   Injury of face 08/31/2019   Leg abrasion, infected, right, subsequent encounter 08/28/2019   Face lacerations 08/26/2019   Nose fracture 08/26/2019   Concussion 08/26/2019   Wrist pain, acute, right 08/26/2019   Greater trochanteric pain syndrome of right lower extremity 07/30/2019   Viral URI with cough 08/15/2018   Abdominal pain 05/22/2018   Traumatic hematoma of right knee 01/10/2018   Ulcer of right knee (HCC)    Bradycardia with 41-50 beats per minute 12/27/2017   Right bundle branch block (RBBB) on electrocardiogram (ECG) 12/27/2017   Quadriceps tendon rupture 12/24/2017   Chronic kidney  disease, stage 3b ([place]) 12/24/2017   Cellulitis of leg, right with large prepatella hematoma and open wounds 12/24/2017   Iliotibial band syndrome of right side 12/16/2017   Depression 07/11/2017   Intervertebral lumbar disc disorder with myelopathy, lumbar region 04/25/2017   Family history of colon cancer in mother 01/22/2017   Primary osteoarthritis of both first carpometacarpal joints 01/02/2017   Pain 11/21/2016   Gastroenteritis 09/13/2016   Chronic anticoagulation 05/24/2015   Coronary artery disease involving native coronary artery of native heart with angina pectoris ([place]) 12/20/2014   Iliotibial band syndrome of left side 11/16/2014   Diverticulitis of colon 08/30/2014   Chest pain, atypical 07/03/2014   Diarrhea 12/22/2013   Permanent atrial fibrillation ([place]) 11/21/2012   Greater trochanteric bursitis of both hips 05/21/2012   Dizzinesses 11/07/2011   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   Osteoarthritis of left hip 07/03/2010   DEGENERATIVE DISC DISEASE, LUMBOSACRAL SPINE  05/18/2010   SYNCOPE 10/27/2008   Essential hypertension 06/16/2007   GERD (gastroesophageal reflux disease) 06/16/2007   COLONIC POLYPS, HX OF 06/16/2007    THERAPY DIAG:  Unsteadiness on feet  Muscle weakness  Other abnormalities of gait and mobility  REFERRING DIAG: Balance problem [R26.89], Fall on same level from slipping, tripping or stumbling, initial encounter [W01.0XXA]  PERTINENT HISTORY: Afib, bradycardia, hypotension  PRECAUTIONS/RESTRICTIONS:   Fall  SUBJECTIVE: Patient reports that her R hip and thigh are bothering her today.   Pain:  Are you having pain? Yes Pain location: Hematoma R thigh NPRS scale:  current 8/10  Stability: getting better  OBJECTIVE: (objective measures completed at initial evaluation unless otherwise dated)  DIAGNOSTIC FINDINGS:  IMPRESSION: 1. No acute fracture or dislocation. 2. Subcutaneous fat stranding over the lateral aspect of the  right hip and thigh compatible with contusion. There are hyperdense collections lateral to the right hip, the largest measuring 15.0 x 7.1 x 8.0 cm suggesting hematoma given history of falls.             GENERAL OBSERVATION/GAIT:                     Arrives in manual WC, glute weakness compensation with posterior w/s in gait   SENSATION:          Light touch: Appears intact   PALPATION: TTP R lateral thigh and L knee   LE MMT:   MMT Right 05/17/2022 Left 05/17/2022  Hip flexion (L2, L3) 3+ 3+  Knee extension (L3) 3+ 3+  Knee flexion 3+ 3+  Hip abduction 3 3+  Hip extension      Hip external rotation      Hip internal rotation      Hip adduction      Ankle dorsiflexion (L4)      Ankle plantarflexion (S1)      Ankle inversion      Ankle eversion      Great Toe ext (L5)      Grossly        (Blank rows = not tested, score listed is out of 5 possible points.  N = WNL, D = diminished, C = clear for gross weakness with myotome testing, * = concordant pain with testing)   Functional Tests   Eval (05/17/2022)      BERG BALANCE TEST Sitting to Standing: 4.      Stands without using hands and stabilize independently Standing Unsupported: 4.      Stands safely for 2 minutes Sitting Unsupported: 4.     Sits for 2 minutes independently Standing to Sitting: 3.     Controls descent with hands  Transfers: 3.     Transfers safely definite use of hands Standing with eyes closed: 4.     Stands safely for 10 seconds  Standing with feet together: 4.     Stands for 1 minute safely Reaching forward with outstretched arm: 3.     Reaches forward 5 inches Retrieving object from the floor: 3.     Able to pick up with supervision Turning to look behind: 3.     Looks behind one side only, other side less weight shift Turning 360 degrees: 2.     Able to turn slowly, but safely Place alternate foot on stool: 3.     Completes 8 steps in >20 seconds Standing with one foot in front: 4.     Independent  tandem for 30 seconds  Standing on one foot: 0.     Unable   Total Score: 44/56        30'' STS: 9x  UE used? n        10 m max gait speed: 10'', 1 m/s, AD: n                                                                                         PATIENT SURVEYS:  FOTO 45 -> 52     TODAY'S TREATMENT: Creating, reviewing, and completing below HEP     PATIENT EDUCATION:  POC, diagnosis, prognosis, HEP, and outcome measures.  Pt educated via explanation, demonstration, and handout (HEP).  Pt confirms understanding verbally.    HOME EXERCISE PROGRAM: Access Code: [email or web address] URL: [email or web address] Date: 05/17/2022 Prepared by: Shearon Balo   Exercises - Bridge  - 1 x daily - 7 x weekly - 3 sets - 10 reps - 3'' hold - Hooklying Isometric Clamshell  - 1 x daily - 7 x weekly - 3 sets - 10 reps - Standing Tandem Balance with Counter Support  - 1 x daily - 7 x weekly - 3 sets - 45'' hold   ASTERISK SIGNS     Asterisk Signs Eval (05/17/2022)            BERG 44            SLS unable            LE MMT 3+ grossly                                           TREATMENT 9/26: - nu-step L5 88mwhile taking subjective and planning session with patient - slant board stretch - 2' - heel raises on step - 3x10 - LTR - 20x - Bridge - 2x10 - alternating clam - GTB - 2x10 - Hip adduction ball squeeze - 3'' squeeze - 2x10 - sit to stand - 3x10 with 2 kg OH press (NT) - Lateral walking with YTB at ankles - 3 laps  Neuromuscular re-ed: - tandem on foam - 45'' bouts - FT on foam EO/EC - 45" bouts  TREATMENT 9/20:  Therapeutic Exercise: - nu-step L5 538mhile taking subjective and planning session with patient - slant board stretch - 2' - heel raises on step - 3x10 - LTR - 20x - Bridge - 2x10 - alternating clam - GTB - 2x10 - Hip adduction ball squeeze - 3'' squeeze - 2x10 - sit to stand - 3x10 with 2 kg OH press (NT) - Lateral walking with YTB at  ankles - 3 laps (NT)  Neuromuscular re-ed: - tandem on foam - 45'' bouts - rocker board fwd/lat - 2' ea  TREATMENT 9/18:  Therapeutic Exercise: - nu-step L5 50m9mile taking subjective and planning session with patient - slant board stretch - 2' - heel raises on step - 3x10 - sit to stand - 3x10 with 2 kg OH press - Lateral walking with YTB at ankles - 3 laps  Neuromuscular  re-ed: - tandem on foam - 37'' bouts - rocker board fwd/lat - 2' ea     ASSESSMENT:   CLINICAL IMPRESSION: Patient presents to PT with reports of R hip and lateal lower extremity pain and reports HEP non-compliance. Re-printed HEP for patient today and encouraged compliance with home exercises. Session today focused on proximal hip strengthening and balance tasks to decrease risk of falls. Patient was able to tolerate all prescribed exercises with no adverse effects. Patient continues to benefit from skilled PT services and should be progressed as able to improve functional independence.     OBJECTIVE IMPAIRMENTS: Pain, gait, balance, LE strength   ACTIVITY LIMITATIONS: transfers, housework, ambulation, bending, squatting   PERSONAL FACTORS: See medical history and pertinent history     REHAB POTENTIAL: Good   CLINICAL DECISION MAKING: Stable/uncomplicated   EVALUATION COMPLEXITY: Low     GOALS:     SHORT TERM GOALS: Target date: 06/07/2022   [place]lee will be >75% HEP compliant to improve carryover between sessions and facilitate independent management of condition   Evaluation (05/17/2022): ongoing Goal status: INITIAL     LONG TERM GOALS: Target date: 07/12/2022   Felma will improve FOTO score to 52 as a proxy for functional improvement   Evaluation/Baseline (05/17/2022): 45 Goal status: INITIAL     2.  Yarelis will improve 30'' STS (MCID 2) to >/= 11x (w/ UE?: n) to show improved LE strength and improved transfers    Evaluation/Baseline (05/17/2022): 9x  w/ UE? n Goal status: INITIAL      3.  Rosario will improve 10 meter max gait speed to 1.2 m/s (.1 m/s MCID) to show functional improvement in ambulation    Evaluation/Baseline (05/17/2022): 1 m/s Goal status: INITIAL     [place]:        4.  Eugenie will imporve BERG balance score to 50 pts, to show a significant improvement in balance in order to reduce fall risk   Evaluation/Baseline (05/17/2022): 44 pts Goal status: INITIAL   See interpretation below:            PLAN: PT FREQUENCY: 1-2x/week   PT DURATION: 8 weeks (Ending 07/12/2022)   PLANNED INTERVENTIONS: Therapeutic exercises, Aquatic therapy, Therapeutic activity, Neuro Muscular re-education, Gait training, Patient/Family education, Joint mobilization, Dry Needling, Electrical stimulation, Spinal mobilization and/or manipulation, Moist heat, Taping, Vasopneumatic device, Ionotophoresis '4mg'$ /ml Dexamethasone, and Manual therapy   PLAN FOR NEXT SESSION: progressive balance on unstable surfaces, SLS, with head turns, general LE strengthening, gait   Margarette Canada PTA 06/05/2022, 12:58 PM

## 2022-06-07 ENCOUNTER — Ambulatory Visit: Payer: Medicare Other

## 2022-06-07 DIAGNOSIS — M6281 Muscle weakness (generalized): Secondary | ICD-10-CM

## 2022-06-07 DIAGNOSIS — R2681 Unsteadiness on feet: Secondary | ICD-10-CM

## 2022-06-07 DIAGNOSIS — R2689 Other abnormalities of gait and mobility: Secondary | ICD-10-CM

## 2022-06-07 NOTE — Therapy (Signed)
OUTPATIENT PHYSICAL THERAPY TREATMENT NOTE   Patient Name: Hailey [name] MRN: [id number] DOB:04/15/1935, 87 y.o., female Today's Date: 06/07/2022  PCP: Cassandria Anger, MD   REFERRING PROVIDER: Stefanie Libel, MD   PT End of Session - 06/07/22 1615     Visit Number 5    Date for PT Re-Evaluation 07/12/22    Authorization Type UHC MCR - FOTO    Progress Note Due on Visit 10    PT Start Time 1615    PT Stop Time 1655    PT Time Calculation (min) 40 min    Activity Tolerance Patient tolerated treatment well    Behavior During Therapy [facility] for tasks assessed/performed               Past Medical History:  Diagnosis Date   Acute blood loss anemia 12/25/2017   Allergic rhinitis    Anxiety    Arthritis    "my whole spine" (07/01/2017)   Atrial fibrillation ([place])    Bowel obstruction ([place])    in [place]   Bradycardia with 41-50 beats per minute 12/27/2017   Cancer ([place])    Cellulitis of leg, right with large prepatella hematoma and open wounds 12/26/2017   CKD (chronic kidney disease) stage 2, GFR 60-89 ml/min    Colon polyps    Coronary artery disease    10/18 PCI/DES to p/m LCx with cutting balloon to mLcx   Diverticulosis of colon    GERD (gastroesophageal reflux disease)    Hip bursitis 2010   Dr Para March, Post op seroma   History of colon polyps    HSV (herpes simplex virus) anogenital infection 07/2019   HTN (hypertension)    IBS (irritable bowel syndrome)    constipation predominant - Dr Earlean Shawl   Lichen sclerosus    Osteopenia 11/2016   T score -2.0 FRAX 15%/4.3%   PAC (premature atrial contraction)    Symptomatiic   Renal insufficiency    Right bundle branch block (RBBB) on electrocardiogram (ECG) 12/27/2017   Scoliosis    SVT (supraventricular tachycardia) ([place])    brief history   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   biopsy showing vulvar atypia, possible VIN I   Past Surgical History:  Procedure Laterality Date   ANTERIOR AND  POSTERIOR VAGINAL REPAIR  01/2002   Archie Endo 01/23/2011   APPENDECTOMY  1948   CARDIAC CATHETERIZATION  06/26/2017   CORONARY ANGIOPLASTY WITH STENT PLACEMENT  07/01/2017   CORONARY ATHERECTOMY N/A 07/01/2017   Procedure: CORONARY ATHERECTOMY;  Surgeon: Belva Crome, MD;  Location: [facility];  Service: Cardiovascular;  Laterality: N/A;   CORONARY STENT INTERVENTION N/A 07/01/2017   Procedure: CORONARY STENT INTERVENTION;  Surgeon: Belva Crome, MD;  Location: [facility];  Service: Cardiovascular;  Laterality: N/A;   HAMMER TOE SURGERY     HEMORRHOID BANDING     HIP SURGERY Left 04/2009   hip examination under anesthesia followed by greater trochanteric bursectomy; iliotibial band tenotomy/notes 01/20/2011   I & D EXTREMITY Right 01/10/2018   Procedure: IRRIGATION AND DEBRIDEMENT RIGHT KNEE, APPLY WOUND VAC;  Surgeon: Newt Minion, MD;  Location: [facility];  Service: Orthopedics;  Laterality: Right;   KNEE BURSECTOMY Right 04/2009   Archie Endo 01/09/2011   LEFT ATRIAL APPENDAGE OCCLUSION N/A 08/24/2021   Procedure: LEFT ATRIAL APPENDAGE OCCLUSION;  Surgeon: Sherren Mocha, MD;  Location: [facility];  Service: Cardiovascular;  Laterality: N/A;   LEFT HEART CATH AND CORONARY ANGIOGRAPHY  N/A 06/26/2017   Procedure: LEFT HEART CATH AND CORONARY ANGIOGRAPHY;  Surgeon: Belva Crome, MD;  Location: [facility];  Service: Cardiovascular;  Laterality: N/A;   PUBOVAGINAL SLING  01/2002   Archie Endo 01/23/2011   REDUCTION MAMMAPLASTY     TEE WITHOUT CARDIOVERSION N/A 08/24/2021   Procedure: TRANSESOPHAGEAL ECHOCARDIOGRAM (TEE);  Surgeon: Sherren Mocha, MD;  Location: [facility];  Service: Cardiovascular;  Laterality: N/A;   TEMPORARY PACEMAKER N/A 07/01/2017   Procedure: TEMPORARY PACEMAKER;  Surgeon: Belva Crome, MD;  Location: [facility];  Service: Cardiovascular;  Laterality: N/A;   VAGINAL HYSTERECTOMY  01/2002   Vaginal hysterectomy, bilateral  salpingo-oophorectomy/notes 01/23/2011   Patient Active Problem List   Diagnosis Date Noted   Hematoma of right thigh 04/25/2022   Weakness 04/25/2022   Weakness generalized 04/25/2022   Falls frequently 04/19/2022   Iron deficiency anemia 12/08/2021   Herpesviral infection, unspecified 11/22/2021   Skin ulcer, limited to breakdown of skin ([place]) 11/22/2021   Anemia, iron deficiency 11/22/2021   Low serum vitamin B12 11/22/2021   Acute renal failure superimposed on stage 2 chronic kidney disease ([place]) 11/20/2021   ABLA (acute blood loss anemia) 11/20/2021   UTI (urinary tract infection) 11/20/2021   Acute diverticulitis 11/20/2021   Hypotension 11/19/2021   Shortness of breath 06/12/2021   Right leg pain 02/21/2021   Diverticulitis 01/08/2021   Acute respiratory failure with hypoxia (H[facility])    Bacterial colitis    Abnormal CT of the abdomen    Diarrhea of infectious origin    Acute colitis 08/27/2020   Aortic atherosclerosis ([place]) 08/27/2020   Acute prerenal azotemia 08/27/2020   Shock circulatory ([place]) 68/34/1962   Metabolic acidosis with normal anion gap and bicarbonate losses 08/27/2020   Dehydration    Squamous cell cancer of skin of left cheek 22/97/9892   Lichen sclerosus 11/94/1740   Abdominal cramping 03/16/2020   Neurogenic orthostatic hypotension ([place]) 09/16/2019   Injury of face 08/31/2019   Leg abrasion, infected, right, subsequent encounter 08/28/2019   Face lacerations 08/26/2019   Nose fracture 08/26/2019   Concussion 08/26/2019   Wrist pain, acute, right 08/26/2019   Greater trochanteric pain syndrome of right lower extremity 07/30/2019   Viral URI with cough 08/15/2018   Abdominal pain 05/22/2018   Traumatic hematoma of right knee 01/10/2018   Ulcer of right knee (HCC)    Bradycardia with 41-50 beats per minute 12/27/2017   Right bundle branch block (RBBB) on electrocardiogram (ECG) 12/27/2017   Quadriceps tendon rupture 12/24/2017   Chronic kidney  disease, stage 3b ([place]) 12/24/2017   Cellulitis of leg, right with large prepatella hematoma and open wounds 12/24/2017   Iliotibial band syndrome of right side 12/16/2017   Depression 07/11/2017   Intervertebral lumbar disc disorder with myelopathy, lumbar region 04/25/2017   Family history of colon cancer in mother 01/22/2017   Primary osteoarthritis of both first carpometacarpal joints 01/02/2017   Pain 11/21/2016   Gastroenteritis 09/13/2016   Chronic anticoagulation 05/24/2015   Coronary artery disease involving native coronary artery of native heart with angina pectoris ([place]) 12/20/2014   Iliotibial band syndrome of left side 11/16/2014   Diverticulitis of colon 08/30/2014   Chest pain, atypical 07/03/2014   Diarrhea 12/22/2013   Permanent atrial fibrillation ([place]) 11/21/2012   Greater trochanteric bursitis of both hips 05/21/2012   Dizzinesses 11/07/2011   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   Osteoarthritis of left hip 07/03/2010   DEGENERATIVE DISC DISEASE, LUMBOSACRAL  SPINE 05/18/2010   SYNCOPE 10/27/2008   Essential hypertension 06/16/2007   GERD (gastroesophageal reflux disease) 06/16/2007   COLONIC POLYPS, HX OF 06/16/2007    THERAPY DIAG:  Unsteadiness on feet  Other abnormalities of gait and mobility  Muscle weakness  REFERRING DIAG: Balance problem [R26.89], Fall on same level from slipping, tripping or stumbling, initial encounter [W01.0XXA]  PERTINENT HISTORY: Afib, bradycardia, hypotension  PRECAUTIONS/RESTRICTIONS:   Fall  SUBJECTIVE: Patient reports that her pain is highest when she first gets up in the morning and that she initially has a hard time walking.   Pain:  Are you having pain? Yes Pain location: Hematoma R thigh NPRS scale:  current 0/10 (8/10 this morning) Stability: getting better  OBJECTIVE: (objective measures completed at initial evaluation unless otherwise dated)  DIAGNOSTIC FINDINGS:  IMPRESSION: 1. No acute fracture or  dislocation. 2. Subcutaneous fat stranding over the lateral aspect of the right hip and thigh compatible with contusion. There are hyperdense collections lateral to the right hip, the largest measuring 15.0 x 7.1 x 8.0 cm suggesting hematoma given history of falls.             GENERAL OBSERVATION/GAIT:                     Arrives in manual WC, glute weakness compensation with posterior w/s in gait   SENSATION:          Light touch: Appears intact   PALPATION: TTP R lateral thigh and L knee   LE MMT:   MMT Right 05/17/2022 Left 05/17/2022  Hip flexion (L2, L3) 3+ 3+  Knee extension (L3) 3+ 3+  Knee flexion 3+ 3+  Hip abduction 3 3+  Hip extension      Hip external rotation      Hip internal rotation      Hip adduction      Ankle dorsiflexion (L4)      Ankle plantarflexion (S1)      Ankle inversion      Ankle eversion      Great Toe ext (L5)      Grossly        (Blank rows = not tested, score listed is out of 5 possible points.  N = WNL, D = diminished, C = clear for gross weakness with myotome testing, * = concordant pain with testing)   Functional Tests   Eval (05/17/2022)      BERG BALANCE TEST Sitting to Standing: 4.      Stands without using hands and stabilize independently Standing Unsupported: 4.      Stands safely for 2 minutes Sitting Unsupported: 4.     Sits for 2 minutes independently Standing to Sitting: 3.     Controls descent with hands  Transfers: 3.     Transfers safely definite use of hands Standing with eyes closed: 4.     Stands safely for 10 seconds  Standing with feet together: 4.     Stands for 1 minute safely Reaching forward with outstretched arm: 3.     Reaches forward 5 inches Retrieving object from the floor: 3.     Able to pick up with supervision Turning to look behind: 3.     Looks behind one side only, other side less weight shift Turning 360 degrees: 2.     Able to turn slowly, but safely Place alternate foot on stool: 3.     Completes 8  steps in >20 seconds Standing with  one foot in front: 4.     Independent tandem for 30 seconds  Standing on one foot: 0.     Unable   Total Score: 44/56        30'' STS: 9x  UE used? n        10 m max gait speed: 10'', 1 m/s, AD: n                                                                                         PATIENT SURVEYS:  FOTO 45 -> 52     TODAY'S TREATMENT: Creating, reviewing, and completing below HEP     PATIENT EDUCATION:  POC, diagnosis, prognosis, HEP, and outcome measures.  Pt educated via explanation, demonstration, and handout (HEP).  Pt confirms understanding verbally.    HOME EXERCISE PROGRAM: Access Code: [email or web address] URL: [email or web address] Date: 05/17/2022 Prepared by: Shearon Balo   Exercises - Bridge  - 1 x daily - 7 x weekly - 3 sets - 10 reps - 3'' hold - Hooklying Isometric Clamshell  - 1 x daily - 7 x weekly - 3 sets - 10 reps - Standing Tandem Balance with Counter Support  - 1 x daily - 7 x weekly - 3 sets - 45'' hold   ASTERISK SIGNS     Asterisk Signs Eval (05/17/2022)            BERG 44            SLS unable            LE MMT 3+ grossly                                           TREATMENT 9/28: Therapeutic Exercise: - nu-step L5 57mwhile taking subjective and planning session with patient - slant board stretch - 2' - heel raises on step - 3x10 - LTR - 20x - Bridge - 2x10 - Hip adduction pilates ring squeeze- 3'' squeeze - 2x10 - sit to stand - 2x10 with 2 kg OH press - Lateral walking with YTB at ankles - 3 laps - Standing hip extension YTB at ankles 2x10 BIL - Omega knee extension 5# 2x10 - Omega knee flexion 20# 2x10 Neuromuscular re-ed: - tandem on foam - 45'' bouts - FT on foam EC - 45" bouts  TREATMENT 9/26: - nu-step L5 540mhile taking subjective and planning session with patient - slant board stretch - 2' - heel raises on step - 3x10 - LTR - 20x - Bridge - 2x10 - alternating clam  - GTB - 2x10 - Hip adduction ball squeeze - 3'' squeeze - 2x10 - sit to stand - 3x10 with 2 kg OH press (NT) - Lateral walking with YTB at ankles - 3 laps  Neuromuscular re-ed: - tandem on foam - 45'' bouts - FT on foam EO/EC - 45" bouts  TREATMENT 9/20:  Therapeutic Exercise: - nu-step L5 7m20mile taking subjective and planning session with patient - slant board stretch - 2' - heel raises on  step - 3x10 - LTR - 20x - Bridge - 2x10 - alternating clam - GTB - 2x10 - Hip adduction ball squeeze - 3'' squeeze - 2x10 - sit to stand - 3x10 with 2 kg OH press (NT) - Lateral walking with YTB at ankles - 3 laps (NT)  Neuromuscular re-ed: - tandem on foam - 45'' bouts - rocker board fwd/lat - 2' ea    ASSESSMENT:   CLINICAL IMPRESSION: Patient presents to PT with no current pain but reports 8/10 in the morning when she initially gets up. She reports HEP compliance. Session today continued to focus on proximal hip strengthening and balance tasks to reduce her risk of falls. Increased resistance and difficulty of exercises today with good effect. Patient was able to tolerate all prescribed exercises with no adverse effects. Patient continues to benefit from skilled PT services and should be progressed as able to improve functional independence.     OBJECTIVE IMPAIRMENTS: Pain, gait, balance, LE strength   ACTIVITY LIMITATIONS: transfers, housework, ambulation, bending, squatting   PERSONAL FACTORS: See medical history and pertinent history     REHAB POTENTIAL: Good   CLINICAL DECISION MAKING: Stable/uncomplicated   EVALUATION COMPLEXITY: Low     GOALS:     SHORT TERM GOALS: Target date: 06/07/2022   Ismelda will be >75% HEP compliant to improve carryover between sessions and facilitate independent management of condition   Evaluation (05/17/2022): ongoing Goal status: INITIAL     LONG TERM GOALS: Target date: 07/12/2022   [place] will improve FOTO score to 52 as a proxy  for functional improvement   Evaluation/Baseline (05/17/2022): 45 Goal status: INITIAL     2.  [place] will improve 30'' STS (MCID 2) to >/= 11x (w/ UE?: n) to show improved LE strength and improved transfers    Evaluation/Baseline (05/17/2022): 9x  w/ UE? n Goal status: INITIAL     3.  [place] will improve 10 meter max gait speed to 1.2 m/s (.1 m/s MCID) to show functional improvement in ambulation    Evaluation/Baseline (05/17/2022): 1 m/s Goal status: INITIAL     [place]:        4.  [place] will imporve BERG balance score to 50 pts, to show a significant improvement in balance in order to reduce fall risk   Evaluation/Baseline (05/17/2022): 44 pts Goal status: INITIAL   See interpretation below:            PLAN: PT FREQUENCY: 1-2x/week   PT DURATION: 8 weeks (Ending 07/12/2022)   PLANNED INTERVENTIONS: Therapeutic exercises, Aquatic therapy, Therapeutic activity, Neuro Muscular re-education, Gait training, Patient/Family education, Joint mobilization, Dry Needling, Electrical stimulation, Spinal mobilization and/or manipulation, Moist heat, Taping, Vasopneumatic device, Ionotophoresis '4mg'$ /ml Dexamethasone, and Manual therapy   PLAN FOR NEXT SESSION: progressive balance on unstable surfaces, SLS, with head turns, general LE strengthening, gait   Margarette Canada PTA 06/07/2022, 4:55 PM

## 2022-06-11 ENCOUNTER — Encounter: Payer: Self-pay | Admitting: Internal Medicine

## 2022-06-11 ENCOUNTER — Ambulatory Visit (INDEPENDENT_AMBULATORY_CARE_PROVIDER_SITE_OTHER): Payer: Medicare Other | Admitting: Internal Medicine

## 2022-06-11 ENCOUNTER — Ambulatory Visit: Payer: Medicare Other | Admitting: Physical Therapy

## 2022-06-11 DIAGNOSIS — R531 Weakness: Secondary | ICD-10-CM

## 2022-06-11 DIAGNOSIS — E538 Deficiency of other specified B group vitamins: Secondary | ICD-10-CM | POA: Diagnosis not present

## 2022-06-11 DIAGNOSIS — I10 Essential (primary) hypertension: Secondary | ICD-10-CM | POA: Diagnosis not present

## 2022-06-11 DIAGNOSIS — D500 Iron deficiency anemia secondary to blood loss (chronic): Secondary | ICD-10-CM

## 2022-06-11 DIAGNOSIS — Z7901 Long term (current) use of anticoagulants: Secondary | ICD-10-CM

## 2022-06-11 DIAGNOSIS — R296 Repeated falls: Secondary | ICD-10-CM | POA: Diagnosis not present

## 2022-06-11 DIAGNOSIS — M810 Age-related osteoporosis without current pathological fracture: Secondary | ICD-10-CM | POA: Diagnosis not present

## 2022-06-11 DIAGNOSIS — S7011XA Contusion of right thigh, initial encounter: Secondary | ICD-10-CM

## 2022-06-11 NOTE — Assessment & Plan Note (Signed)
On Eliquis

## 2022-06-11 NOTE — Assessment & Plan Note (Signed)
On B12 

## 2022-06-11 NOTE — Assessment & Plan Note (Addendum)
Pt was 5'7", now is  5'3" Options discussed - Prolia Pt had DEXA w/Dr Quincy Simmonds

## 2022-06-11 NOTE — Progress Notes (Signed)
Subjective:  Patient ID: Hailey [name], female    DOB: Apr 24, 1935  Age: 87 y.o. MRN: [id number]  CC: Follow-up (6 week f/u)   HPI Hailey [name] presents for anemia, falls, hearing loss  Outpatient Medications Prior to Visit  Medication Sig Dispense Refill   acetaminophen (TYLENOL) 500 MG tablet Take 1,000 mg by mouth every 6 (six) hours as needed (body aches / sore hip). Maximum of 6 tablets daily ('3000mg'$ )     apixaban (ELIQUIS) 2.5 MG TABS tablet TAKE 1 TABLET BY MOUTH TWICE  DAILY 200 tablet 1   atorvastatin (LIPITOR) 40 MG tablet Take 1 tablet (40 mg total) by mouth every Monday, Wednesday, and Friday. 45 tablet 3   BIOTIN PO Take 5,000 mcg by mouth in the morning.     Cholecalciferol (VITAMIN D3) 50 MCG (2000 UT) capsule Take 1 capsule (2,000 Units total) by mouth daily. 100 capsule 3   Cyanocobalamin (VITAMIN B-12) 1000 MCG SUBL Place 1 tablet (1,000 mcg total) under the tongue daily. 100 tablet 3   diclofenac Sodium (VOLTAREN) 1 % GEL Apply 2 g topically 4 (four) times daily as needed (back pain.).     dicyclomine (BENTYL) 20 MG tablet Take 1 by mouth every 4-6 hours as needed for cramping 30 tablet 3   DULoxetine (CYMBALTA) 20 MG capsule Take 40 mg x 1 week, then 20 mg/day 90 capsule 3   esomeprazole (NEXIUM) 40 MG capsule TAKE ONE CAPSULE ONCE DAILY. (Patient taking differently: Take 40 mg by mouth daily.) 90 capsule 1   Homeopathic Products (ARNICARE) GEL Apply 1 application topically daily as needed (skin bruising).     hyoscyamine (LEVSIN) 0.125 MG tablet TAKE 1-2 TABLETS (0.125-0.25 MG) BY MOUTH EVERY 4 HOURS AS NEEDED FOR UP TO 10 DAYS FOR CRAMPING. (Patient taking differently: Take 0.125-0.25 mg by mouth every 4 (four) hours as needed for cramping.) 100 tablet 1   lidocaine (XYLOCAINE) 5 % ointment Apply 1 Application topically as needed. Apply to the area twice a day to three times if the vulvar area is painful. 35.44 g 0   LORazepam (ATIVAN) 1 MG tablet TAKE ONE TABLET  BY MOUTH TWICE DAILY AS NEEDED FOR ANXIETY / SLEEP. 180 tablet 1   losartan (COZAAR) 25 MG tablet Take 0.5 tablets (12.5 mg total) by mouth daily. 90 tablet 3   metoprolol succinate (TOPROL-XL) 25 MG 24 hr tablet Take 1 tablet (25 mg total) by mouth daily. (Patient taking differently: Take 12.5 mg by mouth daily.) 90 tablet 3   Multiple Vitamins-Minerals (PRESERVISION AREDS 2+MULTI VIT) CAPS Take 1 capsule by mouth daily.     nitroGLYCERIN (NITROSTAT) 0.4 MG SL tablet Place 0.4 mg under the tongue every 5 (five) minutes as needed for chest pain (Call 911 at 3rd dose within 15 minutes.).     ondansetron (ZOFRAN-ODT) 8 MG disintegrating tablet Take 1 tablet (8 mg total) by mouth every 8 (eight) hours as needed for nausea or vomiting. 12 tablet 0   Polyethyl Glycol-Propyl Glycol (LUBRICANT EYE DROPS) 0.4-0.3 % SOLN Place 1-2 drops into both eyes at bedtime.     polyethylene glycol (MIRALAX / GLYCOLAX) 17 g packet Take 17 g by mouth daily as needed for mild constipation.     saccharomyces boulardii (FLORASTOR) 250 MG capsule Take 1 capsule (250 mg total) by mouth 2 (two) times daily. (Patient taking differently: Take 250 mg by mouth daily.) 60 capsule 2   sodium chloride (OCEAN) 0.65 % SOLN nasal spray Place 1  spray into both nostrils at bedtime as needed for congestion.     betamethasone dipropionate (DIPROLENE) 0.05 % ointment Apply 2 times a day to affected areas (Patient not taking: Reported on 05/29/2022)     spironolactone (ALDACTONE) 25 MG tablet Take 25 mg by mouth daily. Taking Mon, Wed and Fri     No facility-administered medications prior to visit.    ROS: Review of Systems  Constitutional:  Negative for activity change, appetite change, chills, fatigue and unexpected weight change.  HENT:  Positive for hearing loss. Negative for congestion, mouth sores and sinus pressure.   Eyes:  Negative for visual disturbance.  Respiratory:  Negative for cough and chest tightness.   Gastrointestinal:   Negative for abdominal pain and nausea.  Genitourinary:  Negative for difficulty urinating, frequency and vaginal pain.  Musculoskeletal:  Negative for back pain and gait problem.  Skin:  Negative for pallor and rash.  Neurological:  Negative for dizziness, tremors, weakness, numbness and headaches.  Hematological:  Bruises/bleeds easily.  Psychiatric/Behavioral:  Negative for confusion and sleep disturbance.     Objective:  BP 130/82 (BP Location: Left Arm)   Pulse 94   Temp (!) 97.2 F (36.2 C) (Oral)   Ht 5' 2.75" (1.594 m)   Wt 141 lb 12.8 oz (64.3 kg)   SpO2 98%   BMI 25.32 kg/m   BP Readings from Last 3 Encounters:  06/11/22 130/82  05/29/22 130/77  05/29/22 110/70    Wt Readings from Last 3 Encounters:  06/11/22 141 lb 12.8 oz (64.3 kg)  05/29/22 142 lb 14.4 oz (64.8 kg)  05/29/22 143 lb (64.9 kg)    Physical Exam Constitutional:      General: She is not in acute distress.    Appearance: She is well-developed.  HENT:     Head: Normocephalic.     Right Ear: External ear normal.     Left Ear: External ear normal.     Nose: Nose normal.  Eyes:     General:        Right eye: No discharge.        Left eye: No discharge.     Conjunctiva/sclera: Conjunctivae normal.     Pupils: Pupils are equal, round, and reactive to light.  Neck:     Thyroid: No thyromegaly.     Vascular: No JVD.     Trachea: No tracheal deviation.  Cardiovascular:     Rate and Rhythm: Normal rate and regular rhythm.     Heart sounds: Normal heart sounds.  Pulmonary:     Effort: No respiratory distress.     Breath sounds: No stridor. No wheezing.  Abdominal:     General: Bowel sounds are normal. There is no distension.     Palpations: Abdomen is soft. There is no mass.     Tenderness: There is no abdominal tenderness. There is no guarding or rebound.  Musculoskeletal:        General: No tenderness.     Cervical back: Normal range of motion and neck supple. No rigidity.   Lymphadenopathy:     Cervical: No cervical adenopathy.  Skin:    Findings: No erythema or rash.  Neurological:     Mental Status: She is oriented to person, place, and time.     Cranial Nerves: No cranial nerve deficit.     Motor: No abnormal muscle tone.     Coordination: Coordination normal.     Deep Tendon Reflexes: Reflexes normal.  Psychiatric:  Behavior: Behavior normal.        Thought Content: Thought content normal.        Judgment: Judgment normal.     Lab Results  Component Value Date   WBC 5.9 05/29/2022   HGB 11.1 (L) 05/29/2022   HCT 34.5 (L) 05/29/2022   PLT 200 05/29/2022   GLUCOSE 115 (H) 05/29/2022   CHOL 167 01/29/2022   TRIG 113 01/29/2022   HDL 109 01/29/2022   LDLDIRECT 89.1 06/29/2013   LDLCALC 39 01/29/2022   ALT 7 05/29/2022   AST 18 05/29/2022   NA 136 05/29/2022   K 4.3 05/29/2022   CL 108 05/29/2022   CREATININE 1.15 (H) 05/29/2022   BUN 28 (H) 05/29/2022   CO2 24 05/29/2022   TSH 2.11 04/29/2015   INR 1.1 12/02/2019   HGBA1C 5.6 07/04/2014    No results found.  Assessment & Plan:   Problem List Items Addressed This Visit     Anemia, iron deficiency    Monitor CBC      Relevant Orders   CBC with Differential/Platelet   Comprehensive metabolic panel   Chronic anticoagulation    On Eliquis      Essential hypertension    On metoprolol and Losartan Jardiance and Spironolactone were d/c'd      Falls frequently    No falls lately Hydrate well No alcohol - drinking some Jardiance and Spironolactone were d/c'd       Hematoma of right thigh    Better      Low serum vitamin B12    On B12      Osteoporosis    Pt was 5'7", now is  5'3" Options discussed - Prolia Pt had DEXA w/Dr Quincy Simmonds      Weakness    Low BP - better Jardiance and Spironolactone were d/c'd Anemia was corrected      Relevant Orders   CBC with Differential/Platelet   Comprehensive metabolic panel      No orders of the defined types  were placed in this encounter.     Follow-up: Return in about 6 months (around 12/11/2022) for a follow-up visit.  Walker Kehr, MD

## 2022-06-11 NOTE — Assessment & Plan Note (Addendum)
No falls lately Hydrate well No alcohol - drinking some Jardiance and Spironolactone were d/c'd

## 2022-06-11 NOTE — Assessment & Plan Note (Addendum)
On metoprolol and Losartan Jardiance and Spironolactone were d/c'd

## 2022-06-11 NOTE — Assessment & Plan Note (Signed)
Low BP - better Jardiance and Spironolactone were d/c'd Anemia was corrected

## 2022-06-11 NOTE — Assessment & Plan Note (Signed)
Better  

## 2022-06-11 NOTE — Assessment & Plan Note (Signed)
Monitor CBC 

## 2022-06-13 ENCOUNTER — Telehealth: Payer: Self-pay | Admitting: *Deleted

## 2022-06-13 ENCOUNTER — Encounter: Payer: Self-pay | Admitting: Physical Therapy

## 2022-06-13 ENCOUNTER — Ambulatory Visit: Payer: Medicare Other | Attending: Sports Medicine | Admitting: Physical Therapy

## 2022-06-13 DIAGNOSIS — M6281 Muscle weakness (generalized): Secondary | ICD-10-CM | POA: Diagnosis not present

## 2022-06-13 DIAGNOSIS — R2689 Other abnormalities of gait and mobility: Secondary | ICD-10-CM | POA: Diagnosis not present

## 2022-06-13 DIAGNOSIS — R2681 Unsteadiness on feet: Secondary | ICD-10-CM | POA: Diagnosis not present

## 2022-06-13 NOTE — Telephone Encounter (Signed)
Patient called left message in triage voicemail asking when her last dexa was done.  Patient said her PCP would like to know. I called and left detailed message on voicemail last dexa was 11/2020.

## 2022-06-13 NOTE — Therapy (Addendum)
PHYSICAL THERAPY UNPLANNED DISCHARGE SUMMARY   Visits from Start of Care: 6  Current functional level related to goals / functional outcomes: Current status unknown   Remaining deficits: Current status unknown   Education / Equipment: Pt has not returned since visit listed below  Patient goals were not assessed. Patient is being discharged due to not returning since the last visit.  (the below note was addended to include the above D/C summary on 07/13/22)   OUTPATIENT PHYSICAL THERAPY TREATMENT NOTE   Patient Name: Hailey [name] MRN: [id number] DOB:18-Dec-1934, 87 y.o., female Today's Date: 06/13/2022  PCP: Cassandria Anger, MD   REFERRING PROVIDER: Stefanie Libel, MD   PT End of Session - 06/13/22 1216     Visit Number 6    Date for PT Re-Evaluation 07/12/22    Authorization Type [name]    Progress Note Due on Visit 10    PT Start Time 1215    PT Stop Time 0370    PT Time Calculation (min) 41 min    Activity Tolerance Patient tolerated treatment well    Behavior During Therapy [facility] for tasks assessed/performed               Past Medical History:  Diagnosis Date   Acute blood loss anemia 12/25/2017   Allergic rhinitis    Anxiety    Arthritis    "my whole spine" (07/01/2017)   Atrial fibrillation ([place])    Bowel obstruction ([place])    in [place]   Bradycardia with 41-50 beats per minute 12/27/2017   Cancer ([place])    Cellulitis of leg, right with large prepatella hematoma and open wounds 12/26/2017   CKD (chronic kidney disease) stage 2, GFR 60-89 ml/min    Colon polyps    Coronary artery disease    10/18 PCI/DES to p/m LCx with cutting balloon to mLcx   Diverticulosis of colon    GERD (gastroesophageal reflux disease)    Hip bursitis 2010   Dr Para March, Post op seroma   History of colon polyps    HSV (herpes simplex virus) anogenital infection 07/2019   HTN (hypertension)    IBS (irritable bowel syndrome)    constipation predominant - Dr  Earlean Shawl   Lichen sclerosus    Osteopenia 11/2016   T score -2.0 FRAX 15%/4.3%   PAC (premature atrial contraction)    Symptomatiic   Renal insufficiency    Right bundle branch block (RBBB) on electrocardiogram (ECG) 12/27/2017   Scoliosis    SVT (supraventricular tachycardia)    brief history   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   biopsy showing vulvar atypia, possible VIN I   Past Surgical History:  Procedure Laterality Date   ANTERIOR AND POSTERIOR VAGINAL REPAIR  01/2002   Archie Endo 01/23/2011   APPENDECTOMY  1948   CARDIAC CATHETERIZATION  06/26/2017   CORONARY ANGIOPLASTY WITH STENT PLACEMENT  07/01/2017   CORONARY ATHERECTOMY N/A 07/01/2017   Procedure: CORONARY ATHERECTOMY;  Surgeon: Belva Crome, MD;  Location: [facility];  Service: Cardiovascular;  Laterality: N/A;   CORONARY STENT INTERVENTION N/A 07/01/2017   Procedure: CORONARY STENT INTERVENTION;  Surgeon: Belva Crome, MD;  Location: [facility];  Service: Cardiovascular;  Laterality: N/A;   HAMMER TOE SURGERY     HEMORRHOID BANDING     HIP SURGERY Left 04/2009   hip examination under anesthesia followed by greater trochanteric bursectomy; iliotibial band tenotomy/notes 01/20/2011   I & D EXTREMITY  Right 01/10/2018   Procedure: IRRIGATION AND DEBRIDEMENT RIGHT KNEE, APPLY WOUND VAC;  Surgeon: Newt Minion, MD;  Location: [facility];  Service: Orthopedics;  Laterality: Right;   KNEE BURSECTOMY Right 04/2009   Archie Endo 01/09/2011   LEFT ATRIAL APPENDAGE OCCLUSION N/A 08/24/2021   Procedure: LEFT ATRIAL APPENDAGE OCCLUSION;  Surgeon: Sherren Mocha, MD;  Location: [facility];  Service: Cardiovascular;  Laterality: N/A;   LEFT HEART CATH AND CORONARY ANGIOGRAPHY N/A 06/26/2017   Procedure: LEFT HEART CATH AND CORONARY ANGIOGRAPHY;  Surgeon: Belva Crome, MD;  Location: [facility];  Service: Cardiovascular;  Laterality: N/A;   PUBOVAGINAL SLING  01/2002   Archie Endo 01/23/2011    REDUCTION MAMMAPLASTY     TEE WITHOUT CARDIOVERSION N/A 08/24/2021   Procedure: TRANSESOPHAGEAL ECHOCARDIOGRAM (TEE);  Surgeon: Sherren Mocha, MD;  Location: [facility];  Service: Cardiovascular;  Laterality: N/A;   TEMPORARY PACEMAKER N/A 07/01/2017   Procedure: TEMPORARY PACEMAKER;  Surgeon: Belva Crome, MD;  Location: [facility];  Service: Cardiovascular;  Laterality: N/A;   VAGINAL HYSTERECTOMY  01/2002   Vaginal hysterectomy, bilateral salpingo-oophorectomy/notes 01/23/2011   Patient Active Problem List   Diagnosis Date Noted   Osteoporosis 06/11/2022   Hematoma of right thigh 04/25/2022   Weakness 04/25/2022   Weakness generalized 04/25/2022   Falls frequently 04/19/2022   Iron deficiency anemia 12/08/2021   Herpesviral infection, unspecified 11/22/2021   Skin ulcer, limited to breakdown of skin ([place]) 11/22/2021   Anemia, iron deficiency 11/22/2021   Low serum vitamin B12 11/22/2021   Acute renal failure superimposed on stage 2 chronic kidney disease ([place]) 11/20/2021   ABLA (acute blood loss anemia) 11/20/2021   UTI (urinary tract infection) 11/20/2021   Acute diverticulitis 11/20/2021   Hypotension 11/19/2021   Shortness of breath 06/12/2021   Right leg pain 02/21/2021   Diverticulitis 01/08/2021   Acute respiratory failure with hypoxia (HCC)    Bacterial colitis    Abnormal CT of the abdomen    Diarrhea of infectious origin    Acute colitis 08/27/2020   Aortic atherosclerosis ([place]) 08/27/2020   Acute prerenal azotemia 08/27/2020   Shock circulatory ([place]) 12/87/8676   Metabolic acidosis with normal anion gap and bicarbonate losses 08/27/2020   Dehydration    Squamous cell cancer of skin of left cheek 72/05/4708   Lichen sclerosus 62/83/6629   Abdominal cramping 03/16/2020   Neurogenic orthostatic hypotension ([place]) 09/16/2019   Injury of face 08/31/2019   Leg abrasion, infected, right, subsequent encounter 08/28/2019   Face lacerations 08/26/2019    Nose fracture 08/26/2019   Concussion 08/26/2019   Wrist pain, acute, right 08/26/2019   Greater trochanteric pain syndrome of right lower extremity 07/30/2019   Viral URI with cough 08/15/2018   Abdominal pain 05/22/2018   Traumatic hematoma of right knee 01/10/2018   Ulcer of right knee (HCC)    Bradycardia with 41-50 beats per minute 12/27/2017   Right bundle branch block (RBBB) on electrocardiogram (ECG) 12/27/2017   Quadriceps tendon rupture 12/24/2017   Chronic kidney disease, stage 3b ([place]) 12/24/2017   Cellulitis of leg, right with large prepatella hematoma and open wounds 12/24/2017   Iliotibial band syndrome of right side 12/16/2017   Depression 07/11/2017   Intervertebral lumbar disc disorder with myelopathy, lumbar region 04/25/2017   Family history of colon cancer in mother 01/22/2017   Primary osteoarthritis of both first carpometacarpal joints 01/02/2017   Pain 11/21/2016   Gastroenteritis 09/13/2016   Chronic anticoagulation  05/24/2015   Coronary artery disease involving native coronary artery of native heart with angina pectoris ([place]) 12/20/2014   Iliotibial band syndrome of left side 11/16/2014   Diverticulitis of colon 08/30/2014   Chest pain, atypical 07/03/2014   Diarrhea 12/22/2013   Permanent atrial fibrillation ([place]) 11/21/2012   Greater trochanteric bursitis of both hips 05/21/2012   Dizzinesses 11/07/2011   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   Osteoarthritis of left hip 07/03/2010   DEGENERATIVE DISC DISEASE, LUMBOSACRAL SPINE 05/18/2010   SYNCOPE 10/27/2008   Essential hypertension 06/16/2007   GERD (gastroesophageal reflux disease) 06/16/2007   COLONIC POLYPS, HX OF 06/16/2007    THERAPY DIAG:  Unsteadiness on feet  Other abnormalities of gait and mobility  Muscle weakness  REFERRING DIAG: Balance problem [R26.89], Fall on same level from slipping, tripping or stumbling, initial encounter [W01.0XXA]  PERTINENT HISTORY: Afib,  bradycardia, hypotension  PRECAUTIONS/RESTRICTIONS:   Fall  SUBJECTIVE: Pt reports that she continues to have morning back pain.  She feels overall more stable.  Pain:  Are you having pain? Yes Pain location: Hematoma R thigh NPRS scale:  current 0/10 (8/10 this morning) Stability: getting better  OBJECTIVE: (objective measures completed at initial evaluation unless otherwise dated)  DIAGNOSTIC FINDINGS:  IMPRESSION: 1. No acute fracture or dislocation. 2. Subcutaneous fat stranding over the lateral aspect of the right hip and thigh compatible with contusion. There are hyperdense collections lateral to the right hip, the largest measuring 15.0 x 7.1 x 8.0 cm suggesting hematoma given history of falls.             GENERAL OBSERVATION/GAIT:                     Arrives in manual WC, glute weakness compensation with posterior w/s in gait   SENSATION:          Light touch: Appears intact   PALPATION: TTP R lateral thigh and L knee   LE MMT:   MMT Right 05/17/2022 Left 05/17/2022  Hip flexion (L2, L3) 3+ 3+  Knee extension (L3) 3+ 3+  Knee flexion 3+ 3+  Hip abduction 3 3+  Hip extension      Hip external rotation      Hip internal rotation      Hip adduction      Ankle dorsiflexion (L4)      Ankle plantarflexion (S1)      Ankle inversion      Ankle eversion      Great Toe ext (L5)      Grossly        (Blank rows = not tested, score listed is out of 5 possible points.  N = WNL, D = diminished, C = clear for gross weakness with myotome testing, * = concordant pain with testing)   Functional Tests   Eval (05/17/2022)      BERG BALANCE TEST Sitting to Standing: 4.      Stands without using hands and stabilize independently Standing Unsupported: 4.      Stands safely for 2 minutes Sitting Unsupported: 4.     Sits for 2 minutes independently Standing to Sitting: 3.     Controls descent with hands  Transfers: 3.     Transfers safely definite use of hands Standing with  eyes closed: 4.     Stands safely for 10 seconds  Standing with feet together: 4.     Stands for 1 minute safely Reaching forward with outstretched arm: 3.  Reaches forward 5 inches Retrieving object from the floor: 3.     Able to pick up with supervision Turning to look behind: 3.     Looks behind one side only, other side less weight shift Turning 360 degrees: 2.     Able to turn slowly, but safely Place alternate foot on stool: 3.     Completes 8 steps in >20 seconds Standing with one foot in front: 4.     Independent tandem for 30 seconds  Standing on one foot: 0.     Unable   Total Score: 44/56        30'' STS: 9x  UE used? n        10 m max gait speed: 10'', 1 m/s, AD: n                                                                                         PATIENT SURVEYS:  FOTO 45 -> 52     TODAY'S TREATMENT: Creating, reviewing, and completing below HEP     PATIENT EDUCATION:  POC, diagnosis, prognosis, HEP, and outcome measures.  Pt educated via explanation, demonstration, and handout (HEP).  Pt confirms understanding verbally.    HOME EXERCISE PROGRAM: Access Code: [email or web address] URL: [email or web address] Date: 05/17/2022 Prepared by: Shearon Balo   Exercises - Bridge  - 1 x daily - 7 x weekly - 3 sets - 10 reps - 3'' hold - Hooklying Isometric Clamshell  - 1 x daily - 7 x weekly - 3 sets - 10 reps - Standing Tandem Balance with Counter Support  - 1 x daily - 7 x weekly - 3 sets - 45'' hold   ASTERISK SIGNS     Asterisk Signs Eval (05/17/2022) 10/4           BERG 44            SLS unable 20'' ea           LE MMT 3+ grossly                                           TREATMENT 10/4: Therapeutic Exercise: - nu-step L5 35mwith no UE support while taking subjective and planning session with patient - slant board stretch - 2' - heel raises on step - 3x15 - LTR - 20x - Staggered bridge - 2x10 - Alternating SLR from foam roller -  Hip adduction pilates ring squeeze- 3'' squeeze - 2x10 - sit to stand - 2x10 with 6# - Lateral walking with YTB at ankles - 3 laps (NT - Omega knee extension 20# 3x10 - Omega knee flexion 20# 2x10 (NT)  Neuromuscular re-ed: - semi-tandem (50%) EC - 45'' bouts - SLS - 45'' bouts  TREATMENT 9/28: Therapeutic Exercise: - nu-step L5 59mhile taking subjective and planning session with patient - slant board stretch - 2' - heel raises on step - 3x10 - LTR - 20x - Bridge - 2x10 - Hip adduction pilates ring squeeze- 3'' squeeze - 2x10 - sit  to stand - 2x10 with 2 kg OH press - Lateral walking with YTB at ankles - 3 laps - Standing hip extension YTB at ankles 2x10 BIL - Omega knee extension 5# 2x10 - Omega knee flexion 20# 2x10 Neuromuscular re-ed: - tandem on foam - 45'' bouts - FT on foam EC - 45" bouts  TREATMENT 9/26: - nu-step L5 69mwhile taking subjective and planning session with patient - slant board stretch - 2' - heel raises on step - 3x10 - LTR - 20x - Bridge - 2x10 - alternating clam - GTB - 2x10 - Hip adduction ball squeeze - 3'' squeeze - 2x10 - sit to stand - 3x10 with 2 kg OH press (NT) - Lateral walking with YTB at ankles - 3 laps  Neuromuscular re-ed: - tandem on foam - 45'' bouts - FT on foam EO/EC - 45" bouts  TREATMENT 9/20:  Therapeutic Exercise: - nu-step L5 51mhile taking subjective and planning session with patient - slant board stretch - 2' - heel raises on step - 3x10 - LTR - 20x - Bridge - 2x10 - alternating clam - GTB - 2x10 - Hip adduction ball squeeze - 3'' squeeze - 2x10 - sit to stand - 3x10 with 2 kg OH press (NT) - Lateral walking with YTB at ankles - 3 laps (NT)  Neuromuscular re-ed: - tandem on foam - 45'' bouts - rocker board fwd/lat - 2' ea    ASSESSMENT:   CLINICAL IMPRESSION: Merida tolerated session well with no adverse reaction.  We continue concentration on balance and general LE/hip/core strengthening.  Her balance  is progressing well and we were able to move to semi-tandem EC today as well as up to 20'' of SLS, stressing the vestibular and proprioceptive components of balance.  She continues to make progress with strength as well.    OBJECTIVE IMPAIRMENTS: Pain, gait, balance, LE strength   ACTIVITY LIMITATIONS: transfers, housework, ambulation, bending, squatting   PERSONAL FACTORS: See medical history and pertinent history     REHAB POTENTIAL: Good   CLINICAL DECISION MAKING: Stable/uncomplicated   EVALUATION COMPLEXITY: Low     GOALS:     SHORT TERM GOALS: Target date: 06/07/2022   CoRobertill be >75% HEP compliant to improve carryover between sessions and facilitate independent management of condition   Evaluation (05/17/2022): ongoing Goal status: MET 10/4     LONG TERM GOALS: Target date: 07/12/2022   CoSolveigill improve FOTO score to 52 as a proxy for functional improvement   Evaluation/Baseline (05/17/2022): 45 Goal status: INITIAL     2.  Jameika will improve 30'' STS (MCID 2) to >/= 11x (w/ UE?: n) to show improved LE strength and improved transfers    Evaluation/Baseline (05/17/2022): 9x  w/ UE? n Goal status: INITIAL     3.  Militza will improve 10 meter max gait speed to 1.2 m/s (.1 m/s MCID) to show functional improvement in ambulation    Evaluation/Baseline (05/17/2022): 1 m/s Goal status: INITIAL     Norms:        4.  Teryl will imporve BERG balance score to 50 pts, to show a significant improvement in balance in order to reduce fall risk   Evaluation/Baseline (05/17/2022): 44 pts Goal status: INITIAL   See interpretation below:            PLAN: PT FREQUENCY: 1-2x/week   PT DURATION: 8 weeks (Ending 07/12/2022)   PLANNED INTERVENTIONS: Therapeutic exercises, Aquatic therapy, Therapeutic activity, Neuro Muscular re-education,  Gait training, Patient/Family education, Joint mobilization, Dry Needling, Electrical stimulation, Spinal mobilization and/or  manipulation, Moist heat, Taping, Vasopneumatic device, Ionotophoresis 50m/ml Dexamethasone, and Manual therapy   PLAN FOR NEXT SESSION: progressive balance on unstable surfaces, SLS, with head turns, general LE strengthening, gait   KKevan NyReinhartsen PT 06/13/2022, 1:01 PM

## 2022-06-14 ENCOUNTER — Ambulatory Visit: Payer: Medicare Other | Admitting: Internal Medicine

## 2022-06-15 ENCOUNTER — Telehealth: Payer: Self-pay | Admitting: Hematology

## 2022-06-15 NOTE — Telephone Encounter (Signed)
Left message with follow-up appointment per 9/19 los.

## 2022-06-18 ENCOUNTER — Other Ambulatory Visit: Payer: Self-pay | Admitting: Interventional Cardiology

## 2022-06-18 ENCOUNTER — Other Ambulatory Visit: Payer: Self-pay | Admitting: *Deleted

## 2022-06-18 ENCOUNTER — Ambulatory Visit: Payer: Medicare Other | Admitting: Physical Therapy

## 2022-06-18 MED ORDER — LOSARTAN POTASSIUM 25 MG PO TABS: 12.5000 mg | ORAL_TABLET | Freq: Every day | ORAL | 3 refills | Status: DC

## 2022-06-20 ENCOUNTER — Encounter: Payer: Medicare Other | Admitting: Physical Therapy

## 2022-06-20 ENCOUNTER — Ambulatory Visit: Payer: Medicare Other | Admitting: Physical Therapy

## 2022-06-21 ENCOUNTER — Ambulatory Visit: Payer: Medicare Other | Admitting: Physical Medicine and Rehabilitation

## 2022-06-21 ENCOUNTER — Ambulatory Visit: Payer: Self-pay

## 2022-06-21 VITALS — BP 110/76 | HR 81

## 2022-06-21 DIAGNOSIS — M5416 Radiculopathy, lumbar region: Secondary | ICD-10-CM

## 2022-06-21 MED ORDER — METHYLPREDNISOLONE ACETATE 80 MG/ML IJ SUSP
40.0000 mg | Freq: Once | INTRAMUSCULAR | Status: AC
  Administered 2022-06-21: 40 mg

## 2022-06-21 NOTE — Progress Notes (Signed)
Numeric Pain Rating Scale and Functional Assessment Average Pain 5   In the last MONTH (on 0-10 scale) has pain interfered with the following?  1. General activity like being  able to carry out your everyday physical activities such as walking, climbing stairs, carrying groceries, or moving a chair?  Rating( varies )   +Driver, -BT- Eliquis, -Dye Allergies.   Pain is worse in the mornings and sitting still. Pain radiates to right leg, sometimes to the point of not being able to put weight on it. Takes Tylenol for pain

## 2022-06-21 NOTE — Patient Instructions (Signed)

## 2022-06-25 ENCOUNTER — Ambulatory Visit: Payer: Medicare Other | Admitting: Physical Therapy

## 2022-06-25 DIAGNOSIS — L0100 Impetigo, unspecified: Secondary | ICD-10-CM | POA: Diagnosis not present

## 2022-06-27 ENCOUNTER — Ambulatory Visit: Payer: Medicare Other | Admitting: Physical Therapy

## 2022-07-02 NOTE — Progress Notes (Signed)
Hailey [name] - 87 y.o. female MRN [id number]  Date of birth: 12/22/1934  Office Visit Note: Visit Date: 06/21/2022 PCP: Cassandria Anger, MD Referred by: Cassandria Anger, MD  Subjective: Chief Complaint  Patient presents with   Lower Back - Pain   HPI:  Hailey [name] is a 87 y.o. female who comes in today for planned repeat Right L4-5  Lumbar Transforaminal epidural steroid injection with fluoroscopic guidance.  The patient has failed conservative care including home exercise, medications, time and activity modification.  This injection will be diagnostic and hopefully therapeutic.  Please see requesting physician notes for further details and justification. Patient received more than 50% pain relief from prior injection.  She has a pretty significant degenerative spine with facet arthropathy and foraminal narrowing but no high-grade central stenosis.  MRI was completed this year and reviewed again.  Referring: Dr. Meridee Score   ROS Otherwise per HPI.  Assessment & Plan: Visit Diagnoses:    ICD-10-CM   1. Lumbar radiculopathy  M54.16 XR C-ARM NO REPORT    Epidural Steroid injection    methylPREDNISolone acetate (DEPO-MEDROL) injection 40 mg      Plan: No additional findings.   Meds & Orders:  Meds ordered this encounter  Medications   methylPREDNISolone acetate (DEPO-MEDROL) injection 40 mg    Orders Placed This Encounter  Procedures   XR C-ARM NO REPORT   Epidural Steroid injection    Follow-up: Return for visit to requesting provider as needed.   Procedures: No procedures performed  Lumbosacral Transforaminal Epidural Steroid Injection - Sub-Pedicular Approach with Fluoroscopic Guidance  Patient: Hailey [name]      Date of Birth: November 14, 1934 MRN: [id number] PCP: Cassandria Anger, MD      Visit Date: 06/21/2022   Universal Protocol:    Date/Time: 06/21/2022  Consent Given By: the patient  Position: PRONE  Additional Comments: Vital signs  were monitored before and after the procedure. Patient was prepped and draped in the usual sterile fashion. The correct patient, procedure, and site was verified.   Injection Procedure Details:   Procedure diagnoses: Lumbar radiculopathy [M54.16]    Meds Administered:  Meds ordered this encounter  Medications   methylPREDNISolone acetate (DEPO-MEDROL) injection 40 mg    Laterality: Right  Location/Site: L4  Needle:5.0 in., 22 ga.  Short bevel or Quincke spinal needle  Needle Placement: Transforaminal  Findings:    -Comments: Excellent flow of contrast along the nerve, nerve root and into the epidural space.  Procedure Details: After squaring off the end-plates to get a true AP view, the C-arm was positioned so that an oblique view of the foramen as noted above was visualized. The target area is just inferior to the "nose of the scotty dog" or sub pedicular. The soft tissues overlying this structure were infiltrated with 2-3 ml. of 1% Lidocaine without Epinephrine.  The spinal needle was inserted toward the target using a "trajectory" view along the fluoroscope beam.  Under AP and lateral visualization, the needle was advanced so it did not puncture dura and was located close the 6 O'Clock position of the pedical in AP tracterory. Biplanar projections were used to confirm position. Aspiration was confirmed to be negative for CSF and/or blood. A 1-2 ml. volume of Isovue-250 was injected and flow of contrast was noted at each level. Radiographs were obtained for documentation purposes.   After attaining the desired flow of contrast documented above, a 0.5 to 1.0 ml test dose of 0.25%  Marcaine was injected into each respective transforaminal space.  The patient was observed for 90 seconds post injection.  After no sensory deficits were reported, and normal lower extremity motor function was noted,   the above injectate was administered so that equal amounts of the injectate were placed at  each foramen (level) into the transforaminal epidural space.   Additional Comments:  No complications occurred Dressing: 2 x 2 sterile gauze and Band-Aid    Post-procedure details: Patient was observed during the procedure. Post-procedure instructions were reviewed.  Patient left the clinic in stable condition.    Clinical History: EXAM: MRI LUMBAR SPINE WITHOUT CONTRAST   TECHNIQUE: Multiplanar, multisequence MR imaging of the lumbar spine was performed. No intravenous contrast was administered.   COMPARISON:  MR lumbar 03/20/2017; X-ray lumbar 09/14/2021.   FINDINGS: Segmentation:  Standard.   Alignment: Scoliotic thoracolumbar curvature. Mild retrolisthesis at L1-2, L2-3, and L3-4.   Vertebrae: No fracture, evidence of discitis, or suspicious bone lesion. Scattered intraosseous hemangiomas.   Conus medullaris and cauda equina: Conus extends to the L1 level. Conus and cauda equina appear normal.   Paraspinal and other soft tissues: No acute findings.   Disc levels:   T12-L1: Mild disc bulge. No foraminal or canal stenosis. Unchanged.   L1-L2: Disc height loss with mild disc bulge and endplate ridging. Mild left foraminal stenosis. No canal stenosis. Unchanged.   L2-L3: Retrolisthesis. Diffuse disc bulge with small caudally extending right paracentral disc extrusion. Mild bilateral facet arthropathy. Mild canal stenosis with moderate bilateral foraminal stenosis. Findings have progressed from prior.   L3-L4: Diffuse disc bulge with mild bilateral facet arthropathy and ligamentum flavum buckling. Findings result in mild-moderate canal stenosis with moderate right foraminal stenosis. Findings progressed from prior.   L4-L5: Disc bulge with endplate osteophytic ridging. Advanced bilateral facet arthropathy. Severe right and mild left foraminal stenosis. No significant interval progression.   L5-S1: Disc height loss and endplate ridging. Mild  facet hypertrophy. No canal stenosis. Mild-to-moderate left and mild right foraminal stenosis. Unchanged.   IMPRESSION: 1. Multilevel lumbar spondylosis, slightly progressed from prior MRI. 2. Mild-moderate canal stenosis with moderate right foraminal stenosis at L3-4. 3. Mild canal stenosis and moderate bilateral foraminal stenosis at L2-3. 4. Severe right and mild left foraminal stenosis at L4-5.     Electronically Signed   By: Davina Poke D.O.   On: 11/29/2021 13:23     Objective:  VS:  HT:    WT:   BMI:     BP:110/76  HR:81bpm  TEMP: ( )  RESP:  Physical Exam Vitals and nursing note reviewed.  Constitutional:      General: She is not in acute distress.    Appearance: Normal appearance. She is not ill-appearing.  HENT:     Head: Normocephalic and atraumatic.     Right Ear: External ear normal.     Left Ear: External ear normal.  Eyes:     Extraocular Movements: Extraocular movements intact.  Cardiovascular:     Rate and Rhythm: Normal rate.     Pulses: Normal pulses.  Pulmonary:     Effort: Pulmonary effort is normal. No respiratory distress.  Abdominal:     General: There is no distension.     Palpations: Abdomen is soft.  Musculoskeletal:        General: Tenderness present.     Cervical back: Neck supple.     Right lower leg: No edema.     Left lower leg: No edema.  Comments: Patient has good distal strength with no pain over the greater trochanters.  No clonus or focal weakness.  Skin:    Findings: No erythema, lesion or rash.  Neurological:     General: No focal deficit present.     Mental Status: She is alert and oriented to person, place, and time.     Sensory: No sensory deficit.     Motor: No weakness or abnormal muscle tone.     Coordination: Coordination normal.  Psychiatric:        Mood and Affect: Mood normal.        Behavior: Behavior normal.      Imaging: No results found.

## 2022-07-02 NOTE — Procedures (Signed)
Lumbosacral Transforaminal Epidural Steroid Injection - Sub-Pedicular Approach with Fluoroscopic Guidance  Patient: Hailey [name]      Date of Birth: 1935-06-09 MRN: [id number] PCP: Cassandria Anger, MD      Visit Date: 06/21/2022   Universal Protocol:    Date/Time: 06/21/2022  Consent Given By: the patient  Position: PRONE  Additional Comments: Vital signs were monitored before and after the procedure. Patient was prepped and draped in the usual sterile fashion. The correct patient, procedure, and site was verified.   Injection Procedure Details:   Procedure diagnoses: Lumbar radiculopathy [M54.16]    Meds Administered:  Meds ordered this encounter  Medications   methylPREDNISolone acetate (DEPO-MEDROL) injection 40 mg    Laterality: Right  Location/Site: L4  Needle:5.0 in., 22 ga.  Short bevel or Quincke spinal needle  Needle Placement: Transforaminal  Findings:    -Comments: Excellent flow of contrast along the nerve, nerve root and into the epidural space.  Procedure Details: After squaring off the end-plates to get a true AP view, the C-arm was positioned so that an oblique view of the foramen as noted above was visualized. The target area is just inferior to the "nose of the scotty dog" or sub pedicular. The soft tissues overlying this structure were infiltrated with 2-3 ml. of 1% Lidocaine without Epinephrine.  The spinal needle was inserted toward the target using a "trajectory" view along the fluoroscope beam.  Under AP and lateral visualization, the needle was advanced so it did not puncture dura and was located close the 6 O'Clock position of the pedical in AP tracterory. Biplanar projections were used to confirm position. Aspiration was confirmed to be negative for CSF and/or blood. A 1-2 ml. volume of Isovue-250 was injected and flow of contrast was noted at each level. Radiographs were obtained for documentation purposes.   After attaining the desired  flow of contrast documented above, a 0.5 to 1.0 ml test dose of 0.25% Marcaine was injected into each respective transforaminal space.  The patient was observed for 90 seconds post injection.  After no sensory deficits were reported, and normal lower extremity motor function was noted,   the above injectate was administered so that equal amounts of the injectate were placed at each foramen (level) into the transforaminal epidural space.   Additional Comments:  No complications occurred Dressing: 2 x 2 sterile gauze and Band-Aid    Post-procedure details: Patient was observed during the procedure. Post-procedure instructions were reviewed.  Patient left the clinic in stable condition.

## 2022-07-03 ENCOUNTER — Telehealth: Payer: Self-pay | Admitting: Interventional Cardiology

## 2022-07-03 NOTE — Telephone Encounter (Signed)
Returned call to patient.  She states she has been having some swelling around her ankles over the past week. She reports some SOB with activity (such as climbing upstairs). She states her SBP is now in the 130's and this makes her feel better than when it was in the 110's. Denies any change in diet, staying well hydrated and good urine output.  She is monitoring her weight, yesterday was 143 lbs, this morning weight was 142 lbs.  Spironolactone was stopped at last OV on 05/28/22.  Will forward to Dr. Tamala Julian to review and advise.

## 2022-07-03 NOTE — Telephone Encounter (Signed)
Patient has medication issue she would like to discuss with the nurse, wouldn't go into further details.

## 2022-07-05 MED ORDER — EMPAGLIFLOZIN 10 MG PO TABS: 10.0000 mg | ORAL_TABLET | Freq: Every day | ORAL | Status: DC

## 2022-07-05 NOTE — Telephone Encounter (Signed)
Spoke with patient and discussed Dr. Thompson Caul recommendation: Looks like Jardiance and Spironolactone were discontinued in September. BP was relatively low. Ask her to resume Jardiance 10 mg daily to see if breathing and swelling improve without getting BP too low.   Patient states she has Jardiance '10mg'$  (does not need refills at this time) and will resume taking. She will give [place] an update in about a week on her breathing/swelling.  Med list updated.

## 2022-07-09 ENCOUNTER — Other Ambulatory Visit: Payer: Self-pay | Admitting: Interventional Cardiology

## 2022-07-09 ENCOUNTER — Encounter: Payer: Self-pay | Admitting: Physical Medicine and Rehabilitation

## 2022-07-13 ENCOUNTER — Encounter: Payer: Self-pay | Admitting: Internal Medicine

## 2022-07-13 ENCOUNTER — Telehealth: Payer: Self-pay | Admitting: Internal Medicine

## 2022-07-13 NOTE — Telephone Encounter (Signed)
Patient called to make an appt. I offered her Dr.Perry's first available in January. Patient states she needs to be seen sooner than that. Please advise.

## 2022-07-13 NOTE — Telephone Encounter (Signed)
Pt scheduled to see Dr. Henrene Pastor 08/15/22 at 10:20am. Pt aware.

## 2022-07-17 ENCOUNTER — Ambulatory Visit: Payer: Medicare Other | Admitting: Obstetrics and Gynecology

## 2022-07-17 ENCOUNTER — Encounter: Payer: Self-pay | Admitting: Obstetrics and Gynecology

## 2022-07-17 VITALS — BP 116/78 | HR 68 | Temp 98.6°F | Resp 16

## 2022-07-17 DIAGNOSIS — N3090 Cystitis, unspecified without hematuria: Secondary | ICD-10-CM

## 2022-07-17 DIAGNOSIS — R3915 Urgency of urination: Secondary | ICD-10-CM | POA: Diagnosis not present

## 2022-07-17 MED ORDER — AMOXICILLIN 500 MG PO CAPS: 500.0000 mg | ORAL_CAPSULE | Freq: Three times a day (TID) | ORAL | 0 refills | Status: DC

## 2022-07-17 NOTE — Progress Notes (Signed)
GYNECOLOGY  VISIT   HPI: 87 y.o.   Married  Caucasian  female   G2P2001 with No LMP recorded. Patient has had a hysterectomy.   here for uti. Patient complains of urinary urgency & burning with urination over the last weekend.  Unable to give a large enough voided specimen today for urinalysis and culture.   Has been using Aquafor on the left vulva, and this provided relief until recently.   Some urgency to void and a lesser stream.  No blood in the urine.  Notes lower abdominal discomfort.   No fever.   UTI 11/16/21 E Coli.  Treated with Augmentin and had nausea and diarrhea.   She was dx with diverticulitis shortly after that.  Some recent diarrhea.  Has colitis.  Has appt to see GI in a few weeks.   GYNECOLOGIC HISTORY: No LMP recorded. Patient has had a hysterectomy. Contraception:  hysterectomy Menopausal hormone therapy:  none Last mammogram:  01-29-22 category c & 02-08-22 rt breast category c density birads 1:neg Last pap smear:   many yrs ago        OB History     Gravida  2   Para  2   Term  2   Preterm      AB      Living  1      SAB      IAB      Ectopic      Multiple      Live Births  2              Patient Active Problem List   Diagnosis Date Noted   Osteoporosis 06/11/2022   Hematoma of right thigh 04/25/2022   Weakness 04/25/2022   Weakness generalized 04/25/2022   Falls frequently 04/19/2022   Iron deficiency anemia 12/08/2021   Herpesviral infection, unspecified 11/22/2021   Skin ulcer, limited to breakdown of skin ([place]) 11/22/2021   Anemia, iron deficiency 11/22/2021   Low serum vitamin B12 11/22/2021   Acute renal failure superimposed on stage 2 chronic kidney disease ([place]) 11/20/2021   ABLA (acute blood loss anemia) 11/20/2021   UTI (urinary tract infection) 11/20/2021   Acute diverticulitis 11/20/2021   Hypotension 11/19/2021   Shortness of breath 06/12/2021   Right leg pain 02/21/2021   Diverticulitis 01/08/2021    Acute respiratory failure with hypoxia (HCC)    Bacterial colitis    Abnormal CT of the abdomen    Diarrhea of infectious origin    Acute colitis 08/27/2020   Aortic atherosclerosis ([place]) 08/27/2020   Acute prerenal azotemia 08/27/2020   Shock circulatory ([place]) 25/95/6387   Metabolic acidosis with normal anion gap and bicarbonate losses 08/27/2020   Dehydration    Squamous cell cancer of skin of left cheek 56/43/3295   Lichen sclerosus 18/84/1660   Abdominal cramping 03/16/2020   Neurogenic orthostatic hypotension ([place]) 09/16/2019   Injury of face 08/31/2019   Leg abrasion, infected, right, subsequent encounter 08/28/2019   Face lacerations 08/26/2019   Nose fracture 08/26/2019   Concussion 08/26/2019   Wrist pain, acute, right 08/26/2019   Greater trochanteric pain syndrome of right lower extremity 07/30/2019   Viral URI with cough 08/15/2018   Abdominal pain 05/22/2018   Traumatic hematoma of right knee 01/10/2018   Ulcer of right knee ([place])    Bradycardia with 41-50 beats per minute 12/27/2017   Right bundle branch block (RBBB) on electrocardiogram (ECG) 12/27/2017   Quadriceps tendon rupture 12/24/2017   Chronic kidney  disease, stage 3b ([place]) 12/24/2017   Cellulitis of leg, right with large prepatella hematoma and open wounds 12/24/2017   Iliotibial band syndrome of right side 12/16/2017   Depression 07/11/2017   Intervertebral lumbar disc disorder with myelopathy, lumbar region 04/25/2017   Family history of colon cancer in mother 01/22/2017   Primary osteoarthritis of both first carpometacarpal joints 01/02/2017   Pain 11/21/2016   Gastroenteritis 09/13/2016   Chronic anticoagulation 05/24/2015   Coronary artery disease involving native coronary artery of native heart with angina pectoris ([place]) 12/20/2014   Iliotibial band syndrome of left side 11/16/2014   Diverticulitis of colon 08/30/2014   Chest pain, atypical 07/03/2014   Diarrhea 12/22/2013   Permanent atrial  fibrillation ([place]) 11/21/2012   Greater trochanteric bursitis of both hips 05/21/2012   Dizzinesses 11/07/2011   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   Osteoarthritis of left hip 07/03/2010   DEGENERATIVE [place] DISEASE, LUMBOSACRAL SPINE 05/18/2010   SYNCOPE 10/27/2008   Essential hypertension 06/16/2007   GERD (gastroesophageal reflux disease) 06/16/2007   COLONIC POLYPS, HX OF 06/16/2007    Past Medical History:  Diagnosis Date   Acute blood loss anemia 12/25/2017   Allergic rhinitis    Anxiety    Arthritis    "my whole spine" (07/01/2017)   Atrial fibrillation (HCC)    Bowel obstruction (HCC)    in [place]   Bradycardia with 41-50 beats per minute 12/27/2017   Cancer ([place])    Cellulitis of leg, right with large prepatella hematoma and open wounds 12/26/2017   CKD (chronic kidney disease) stage 2, GFR 60-89 ml/min    Colon polyps    Coronary artery disease    10/18 PCI/DES to p/m LCx with cutting balloon to mLcx   Diverticulosis of colon    GERD (gastroesophageal reflux disease)    Hip bursitis 2010   Dr Para March, Post op seroma   History of colon polyps    HSV (herpes simplex virus) anogenital infection 07/2019   HTN (hypertension)    IBS (irritable bowel syndrome)    constipation predominant - Dr Earlean Shawl   Lichen sclerosus    Osteopenia 11/2016   T score -2.0 FRAX 15%/4.3%   PAC (premature atrial contraction)    Symptomatiic   Renal insufficiency    Right bundle branch block (RBBB) on electrocardiogram (ECG) 12/27/2017   Scoliosis    SVT (supraventricular tachycardia)    brief history   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   biopsy showing vulvar atypia, possible VIN I    Past Surgical History:  Procedure Laterality Date   ANTERIOR AND POSTERIOR VAGINAL REPAIR  01/2002   Archie Endo 01/23/2011   APPENDECTOMY  1948   CARDIAC CATHETERIZATION  06/26/2017   CORONARY ANGIOPLASTY WITH STENT PLACEMENT  07/01/2017   CORONARY ATHERECTOMY N/A 07/01/2017    Procedure: CORONARY ATHERECTOMY;  Surgeon: Belva Crome, MD;  Location: [facility];  Service: Cardiovascular;  Laterality: N/A;   CORONARY STENT INTERVENTION N/A 07/01/2017   Procedure: CORONARY STENT INTERVENTION;  Surgeon: Belva Crome, MD;  Location: [facility];  Service: Cardiovascular;  Laterality: N/A;   HAMMER TOE SURGERY     HEMORRHOID BANDING     HIP SURGERY Left 04/2009   hip examination under anesthesia followed by greater trochanteric bursectomy; iliotibial band tenotomy/notes 01/20/2011   I & D EXTREMITY Right 01/10/2018   Procedure: IRRIGATION AND DEBRIDEMENT RIGHT KNEE, APPLY WOUND VAC;  Surgeon: Newt Minion, MD;  Location: [facility];  Service: Orthopedics;  Laterality: Right;   KNEE BURSECTOMY Right 04/2009   Archie Endo 01/09/2011   LEFT ATRIAL APPENDAGE OCCLUSION N/A 08/24/2021   Procedure: LEFT ATRIAL APPENDAGE OCCLUSION;  Surgeon: Sherren Mocha, MD;  Location: [facility];  Service: Cardiovascular;  Laterality: N/A;   LEFT HEART CATH AND CORONARY ANGIOGRAPHY N/A 06/26/2017   Procedure: LEFT HEART CATH AND CORONARY ANGIOGRAPHY;  Surgeon: Belva Crome, MD;  Location: [facility];  Service: Cardiovascular;  Laterality: N/A;   PUBOVAGINAL SLING  01/2002   Archie Endo 01/23/2011   REDUCTION MAMMAPLASTY     TEE WITHOUT CARDIOVERSION N/A 08/24/2021   Procedure: TRANSESOPHAGEAL ECHOCARDIOGRAM (TEE);  Surgeon: Sherren Mocha, MD;  Location: [facility];  Service: Cardiovascular;  Laterality: N/A;   TEMPORARY PACEMAKER N/A 07/01/2017   Procedure: TEMPORARY PACEMAKER;  Surgeon: Belva Crome, MD;  Location: [facility];  Service: Cardiovascular;  Laterality: N/A;   VAGINAL HYSTERECTOMY  01/2002   Vaginal hysterectomy, bilateral salpingo-oophorectomy/notes 01/23/2011    Current Outpatient Medications  Medication Sig Dispense Refill   acetaminophen (TYLENOL) 500 MG tablet Take 1,000 mg by mouth every 6 (six) hours as needed (body aches / sore  hip). Maximum of 6 tablets daily ('3000mg'$ )     apixaban (ELIQUIS) 2.5 MG TABS tablet TAKE 1 TABLET BY MOUTH TWICE  DAILY 200 tablet 1   atorvastatin (LIPITOR) 40 MG tablet Take 1 tablet (40 mg total) by mouth every Monday, Wednesday, and Friday. 45 tablet 3   BIOTIN PO Take 5,000 mcg by mouth in the morning.     Cholecalciferol (VITAMIN D3) 50 MCG (2000 UT) capsule Take 1 capsule (2,000 Units total) by mouth daily. 100 capsule 3   Cyanocobalamin (VITAMIN B-12) 1000 MCG SUBL Place 1 tablet (1,000 mcg total) under the tongue daily. 100 tablet 3   diclofenac Sodium (VOLTAREN) 1 % GEL Apply 2 g topically 4 (four) times daily as needed (back pain.).     dicyclomine (BENTYL) 20 MG tablet Take 1 by mouth every 4-6 hours as needed for cramping 30 tablet 3   DULoxetine (CYMBALTA) 20 MG capsule Take 40 mg x 1 week, then 20 mg/day 90 capsule 3   empagliflozin (JARDIANCE) 10 MG TABS tablet Take 1 tablet (10 mg total) by mouth daily before breakfast.     esomeprazole (NEXIUM) 40 MG capsule TAKE ONE CAPSULE ONCE DAILY. (Patient taking differently: Take 40 mg by mouth daily.) 90 capsule 1   Homeopathic Products (ARNICARE) GEL Apply 1 application topically daily as needed (skin bruising).     hyoscyamine (LEVSIN) 0.125 MG tablet TAKE 1-2 TABLETS (0.125-0.25 MG) BY MOUTH EVERY 4 HOURS AS NEEDED FOR UP TO 10 DAYS FOR CRAMPING. (Patient taking differently: Take 0.125-0.25 mg by mouth every 4 (four) hours as needed for cramping.) 100 tablet 1   LORazepam (ATIVAN) 1 MG tablet TAKE ONE TABLET BY MOUTH TWICE DAILY AS NEEDED FOR ANXIETY / SLEEP. 180 tablet 1   losartan (COZAAR) 25 MG tablet Take 0.5 tablets (12.5 mg total) by mouth daily. 45 tablet 3   metoprolol succinate (TOPROL-XL) 25 MG 24 hr tablet Take 0.5 tablets (12.5 mg total) by mouth daily. 45 tablet 3   Multiple Vitamins-Minerals (PRESERVISION AREDS 2+MULTI VIT) CAPS Take 1 capsule by mouth daily.     ondansetron (ZOFRAN-ODT) 8 MG disintegrating tablet Take 1  tablet (8 mg total) by mouth every 8 (eight) hours as needed for nausea or vomiting. 12 tablet 0   Polyethyl Glycol-Propyl Glycol (LUBRICANT  EYE DROPS) 0.4-0.3 % SOLN Place 1-2 drops into both eyes at bedtime.     polyethylene glycol (MIRALAX / GLYCOLAX) 17 g packet Take 17 g by mouth daily as needed for mild constipation.     saccharomyces boulardii (FLORASTOR) 250 MG capsule Take 1 capsule (250 mg total) by mouth 2 (two) times daily. (Patient taking differently: Take 250 mg by mouth daily.) 60 capsule 2   sodium chloride (OCEAN) 0.65 % SOLN nasal spray Place 1 spray into both nostrils at bedtime as needed for congestion.     nitroGLYCERIN (NITROSTAT) 0.4 MG SL tablet Place 0.4 mg under the tongue every 5 (five) minutes as needed for chest pain (Call 911 at 3rd dose within 15 minutes.). (Patient not taking: Reported on 07/17/2022)     No current facility-administered medications for this visit.     ALLERGIES: Macrobid [nitrofurantoin monohyd macro], Meloxicam, Digoxin and related, Diprolene [betamethasone dipropionate aug], Ferrous sulfate, Keflex [cephalexin], Mobic [meloxicam], and Sulfamethoxazole-trimethoprim  Family History  Problem Relation Age of Onset   Colon cancer Mother        Dx age 33, died at age 75   Diabetes Father    Prostate cancer Father    Prostate cancer Brother    Pancreatic cancer Brother    Stomach cancer Son     Social History   Socioeconomic History   Marital status: Married    Spouse name: [name]   Number of children: 2   Years of education: Not on file   Highest education level: Not on file  Occupational History   Occupation: Patent attorney: RETIRED  Tobacco Use   Smoking status: Former    Packs/day: 0.25    Years: 28.00    Total pack years: 7.00    Types: Cigarettes    Quit date: 1981    Years since quitting: 42.8   Smokeless tobacco: Never  Vaping Use   Vaping Use: Never used  Substance and Sexual Activity   Alcohol use: Yes     Comment: 7 vodka drinks a week   Drug use: Never   Sexual activity: Not Currently    Birth control/protection: Surgical    Comment: hysterectomy  Other Topics Concern   Not on file  Social History Narrative   Regular Exercise -  YES         Social Determinants of Health   Financial Resource Strain: Low Risk  (02/14/2022)   Overall Financial Resource Strain (CARDIA)    Difficulty of Paying Living Expenses: Not hard at all  Food Insecurity: No Food Insecurity (02/14/2022)   Hunger Vital Sign    Worried About Running Out of Food in the Last Year: Never true    Ran Out of Food in the Last Year: Never true  Transportation Needs: No Transportation Needs (02/14/2022)   PRAPARE - Hydrologist (Medical): No    Lack of Transportation (Non-Medical): No  Physical Activity: Inactive (02/14/2022)   Exercise Vital Sign    Days of Exercise per Week: 0 days    Minutes of Exercise per Session: 0 min  Stress: No Stress Concern Present (02/14/2022)   [place]    Feeling of Stress : Only a little  Social Connections: Moderately Integrated (02/14/2022)   Social Connection and Isolation Panel [NHANES]    Frequency of Communication with Friends and Family: More than three times a week    Frequency of Social  Gatherings with Friends and Family: Three times a week    Attends Religious Services: More than 4 times per year    Active Member of Clubs or Organizations: No    Attends Archivist Meetings: Never    Marital Status: Married  Human resources officer Violence: Not At Risk (02/14/2022)   Humiliation, Afraid, Rape, and Kick questionnaire    Fear of Current or Ex-Partner: No    Emotionally Abused: No    Physically Abused: No    Sexually Abused: No    Review of Systems  Constitutional: Negative.   HENT: Negative.    Eyes: Negative.   Respiratory: Negative.    Cardiovascular: Negative.   Gastrointestinal:  Negative.   Endocrine: Negative.   Genitourinary:  Positive for dysuria and urgency.  Musculoskeletal: Negative.   Skin: Negative.   Allergic/Immunologic: Negative.   Neurological: Negative.   Hematological: Negative.   Psychiatric/Behavioral: Negative.      PHYSICAL EXAMINATION:    BP 116/78   Pulse 68   Temp 98.6 F (37 C) (Oral)   Resp 16     General appearance: alert, cooperative and appears stated age    Pelvic: External genitalia: left labia minora with slit in skin and mild erythema (no change)              Urethra:  normal appearing urethra with no masses, tenderness or lesions              Bartholins and Skenes: normal                 Vagina: normal appearing vagina with normal color and discharge, no lesions              Cervix:  absent                Bimanual Exam:  Uterus:  absent              Adnexa: no mass, fullness, tenderness     Bladder catheterization after verbal permission.  Sterile prep with betadine.  I and O cath done and 20 cc cloudy yellow urine obtained.  No complications.        Chaperone was present for exam:  Joy, CMA  ASSESSMENT  Urinary urgency.   PLAN  Urinalysis from catheter specimen:  sg 1.015, ph 6.5, >60 WBC, 10 - 20 RBC, many bacteria, 0 - 5 squams.  Reflex culture sent.  Multiple intolerances to abx.  Amoxicillin 500 mg po tid x 7 days.  Hydrate well.  FU prn.    An After Visit Summary was printed and given to the patient.  25 min  total time was spent for this patient encounter, including preparation, face-to-face counseling with the patient, coordination of care, and documentation of the encounter.

## 2022-07-19 LAB — URINALYSIS, COMPLETE W/RFL CULTURE
Bilirubin Urine: NEGATIVE
Casts: NONE SEEN /LPF
Crystals: NONE SEEN /HPF
Hyaline Cast: NONE SEEN /LPF
Ketones, ur: NEGATIVE
Nitrites, Initial: NEGATIVE
Specific Gravity, Urine: 1.023 (ref 1.001–1.035)
WBC, UA: >=60 /HPF — AB (ref 0–5)
Yeast: NONE SEEN /HPF
pH: 6.5 (ref 5.0–8.0)

## 2022-07-19 LAB — URINE CULTURE
MICRO NUMBER:: 14154561
SPECIMEN QUALITY:: ADEQUATE

## 2022-07-19 LAB — CULTURE INDICATED

## 2022-07-21 ENCOUNTER — Other Ambulatory Visit: Payer: Self-pay | Admitting: Internal Medicine

## 2022-07-25 ENCOUNTER — Encounter: Payer: Self-pay | Admitting: Interventional Cardiology

## 2022-07-25 DIAGNOSIS — H02885 Meibomian gland dysfunction left lower eyelid: Secondary | ICD-10-CM | POA: Diagnosis not present

## 2022-07-25 DIAGNOSIS — I10 Essential (primary) hypertension: Secondary | ICD-10-CM

## 2022-07-30 MED ORDER — FUROSEMIDE 20 MG PO TABS: 20.0000 mg | ORAL_TABLET | Freq: Every day | ORAL | 11 refills | Status: DC

## 2022-08-04 ENCOUNTER — Other Ambulatory Visit: Payer: Self-pay | Admitting: Nurse Practitioner

## 2022-08-04 ENCOUNTER — Other Ambulatory Visit: Payer: Self-pay | Admitting: Interventional Cardiology

## 2022-08-08 MED ORDER — EMPAGLIFLOZIN 10 MG PO TABS: 10.0000 mg | ORAL_TABLET | Freq: Every day | ORAL | 9 refills | Status: DC

## 2022-08-09 ENCOUNTER — Telehealth: Payer: Self-pay | Admitting: Interventional Cardiology

## 2022-08-09 ENCOUNTER — Other Ambulatory Visit: Payer: Self-pay | Admitting: Interventional Cardiology

## 2022-08-09 MED ORDER — ATORVASTATIN CALCIUM 40 MG PO TABS: 40.0000 mg | ORAL_TABLET | ORAL | 2 refills | Status: DC

## 2022-08-09 NOTE — Telephone Encounter (Signed)
*  STAT* If patient is at the pharmacy, call can be transferred to refill team.   1. Which medications need to be refilled? (please list name of each medication and dose if known)   atorvastatin (LIPITOR) 40 MG tablet   2. Which pharmacy/location (including street and city if local pharmacy) is medication to be sent to?  [place], [address]   3. Do they need a 30 day or 90 day supply?   90 day  Caller stated patient is out of this medication.

## 2022-08-09 NOTE — Telephone Encounter (Signed)
Pt's medication was sent to pt's pharmacy as requested. Confirmation received.  °

## 2022-08-14 ENCOUNTER — Encounter: Payer: Self-pay | Admitting: Physical Medicine and Rehabilitation

## 2022-08-14 NOTE — Addendum Note (Signed)
Addended by: Molli Barrows on: 08/14/2022 05:27 PM   Modules accepted: Orders

## 2022-08-15 ENCOUNTER — Encounter: Payer: Self-pay | Admitting: Internal Medicine

## 2022-08-15 ENCOUNTER — Ambulatory Visit: Payer: Medicare Other | Admitting: Internal Medicine

## 2022-08-15 VITALS — BP 122/80 | HR 76 | Ht 62.0 in | Wt 139.5 lb

## 2022-08-15 DIAGNOSIS — K589 Irritable bowel syndrome without diarrhea: Secondary | ICD-10-CM

## 2022-08-15 NOTE — Patient Instructions (Signed)
_______________________________________________________  If you are age 85 or older, your body mass index should be between 23-30. Your Body mass index is 25.51 kg/m. If this is out of the aforementioned range listed, please consider follow up with your Primary Care Provider.  If you are age 53 or younger, your body mass index should be between 19-25. Your Body mass index is 25.51 kg/m. If this is out of the aformentioned range listed, please consider follow up with your Primary Care Provider.   ________________________________________________________  The [facility] providers would like to encourage you to use [facility] to communicate with providers for non-urgent requests or questions.  Due to long hold times on the telephone, sending your provider a message by [facility] may be a faster and more efficient way to get a response.  Please allow 48 business hours for a response.  Please remember that this is for non-urgent requests.  _______________________________________________________  Take 2 tablespoons of Citrucel daily  Continue dicyclomine for cramping

## 2022-08-15 NOTE — Progress Notes (Signed)
HISTORY OF PRESENT ILLNESS:  Hailey [name] is a 87 y.o. female with a history of constipation predominant irritable bowel syndrome and diverticulitis who presents today complaining of increased frequency of sporadic loose stools.  The patient was last seen in this office December 06, 2021 after a bout of diverticulitis.  See that dictation.  She tells me that she did well throughout the summer.  However, in September she would develop a day or 2 we have abdominal cramping with loose stools.  Sometimes this affected her ability to leave the house.  In between episodes, felt well.  About a week ago she did stop MiraLAX.  Last such episode was this morning.  Her weight is stable.  She occasionally uses dicyclomine for cramping, which helps.  She has infrequent nausea, but does use Zofran, which helps.  She continues on a probiotic Florastor.  She has recently resumed Jardiance (question possible side effect).  No new complaints.  She reminds me of her family history of cancer.  Blood work from May 29, 2022 shows unremarkable comprehensive metabolic panel except for mildly abnormal renal function.  Normal iron studies.  Improved hemoglobin of 11.1.  REVIEW OF SYSTEMS:  All non-GI ROS negative unless otherwise noted in the HPI except for anxiety, arthritis, back pain, depression, fatigue, hearing problems, heart rhythm change, muscle cramps, sleeping problems, shortness of breath  Past Medical History:  Diagnosis Date   Acute blood loss anemia 12/25/2017   Allergic rhinitis    Anxiety    Arthritis    "my whole spine" (07/01/2017)   Atrial fibrillation (HCC)    Bowel obstruction (HCC)    in [place]   Bradycardia with 41-50 beats per minute 12/27/2017   Cancer ([place])    Cellulitis of leg, right with large prepatella hematoma and open wounds 12/26/2017   CKD (chronic kidney disease) stage 2, GFR 60-89 ml/min    Colon polyps    Coronary artery disease    10/18 PCI/DES to p/m LCx with cutting  balloon to mLcx   Diverticulosis of colon    GERD (gastroesophageal reflux disease)    Hip bursitis 2010   Dr Para March, Post op seroma   History of colon polyps    HSV (herpes simplex virus) anogenital infection 07/2019   HTN (hypertension)    IBS (irritable bowel syndrome)    constipation predominant - Dr Earlean Shawl   Lichen sclerosus    Osteopenia 11/2016   T score -2.0 FRAX 15%/4.3%   PAC (premature atrial contraction)    Symptomatiic   Renal insufficiency    Right bundle branch block (RBBB) on electrocardiogram (ECG) 12/27/2017   Scoliosis    SVT (supraventricular tachycardia)    brief history   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   biopsy showing vulvar atypia, possible VIN I    Past Surgical History:  Procedure Laterality Date   ANTERIOR AND POSTERIOR VAGINAL REPAIR  01/2002   Archie Endo 01/23/2011   APPENDECTOMY  1948   CARDIAC CATHETERIZATION  06/26/2017   CORONARY ANGIOPLASTY WITH STENT PLACEMENT  07/01/2017   CORONARY ATHERECTOMY N/A 07/01/2017   Procedure: CORONARY ATHERECTOMY;  Surgeon: Belva Crome, MD;  Location: [facility];  Service: Cardiovascular;  Laterality: N/A;   CORONARY STENT INTERVENTION N/A 07/01/2017   Procedure: CORONARY STENT INTERVENTION;  Surgeon: Belva Crome, MD;  Location: [place] [facility];  Service: Cardiovascular;  Laterality: N/A;   HAMMER TOE SURGERY     HEMORRHOID BANDING  HIP SURGERY Left 04/2009   hip examination under anesthesia followed by greater trochanteric bursectomy; iliotibial band tenotomy/notes 01/20/2011   I & D EXTREMITY Right 01/10/2018   Procedure: IRRIGATION AND DEBRIDEMENT RIGHT KNEE, APPLY WOUND VAC;  Surgeon: Newt Minion, MD;  Location: [place];  Service: Orthopedics;  Laterality: Right;   KNEE BURSECTOMY Right 04/2009   Archie Endo 01/09/2011   LEFT ATRIAL APPENDAGE OCCLUSION N/A 08/24/2021   Procedure: LEFT ATRIAL APPENDAGE OCCLUSION;  Surgeon: Sherren Mocha, MD;  Location: [place] [facility] [facility];   Service: Cardiovascular;  Laterality: N/A;   LEFT HEART CATH AND CORONARY ANGIOGRAPHY N/A 06/26/2017   Procedure: LEFT HEART CATH AND CORONARY ANGIOGRAPHY;  Surgeon: Belva Crome, MD;  Location: [facility];  Service: Cardiovascular;  Laterality: N/A;   PUBOVAGINAL SLING  01/2002   Archie Endo 01/23/2011   REDUCTION MAMMAPLASTY     TEE WITHOUT CARDIOVERSION N/A 08/24/2021   Procedure: TRANSESOPHAGEAL ECHOCARDIOGRAM (TEE);  Surgeon: Sherren Mocha, MD;  Location: [place] [facility];  Service: Cardiovascular;  Laterality: N/A;   TEMPORARY PACEMAKER N/A 07/01/2017   Procedure: TEMPORARY PACEMAKER;  Surgeon: Belva Crome, MD;  Location: [place] [facility] [facility];  Service: Cardiovascular;  Laterality: N/A;   VAGINAL HYSTERECTOMY  01/2002   Vaginal hysterectomy, bilateral salpingo-oophorectomy/notes 01/23/2011    Social History Joette MARGUERITE BARBA  reports that she quit smoking about 42 years ago. Her smoking use included cigarettes. She has a 7.00 pack-year smoking history. She has never used smokeless tobacco. She reports current alcohol use. She reports that she does not use drugs.  family history includes Colon cancer in her mother; Diabetes in her father; Pancreatic cancer in her brother; Prostate cancer in her brother and father; Stomach cancer in her son.  Allergies  Allergen Reactions   Macrobid [Nitrofurantoin Monohyd Macro] Other (See Comments)    Nausea, stomach cramps, fatigue , headache.   Meloxicam Other (See Comments)    Jittery and headache   Digoxin And Related Other (See Comments)    headaches   Diprolene [Betamethasone Dipropionate Aug] Other (See Comments)    Patient states that the bruising was made worse when using this   Ferrous Sulfate Other (See Comments)    Bad constipation    Keflex [Cephalexin] Nausea And Vomiting    Nausea, fatigue, and headache.   Mobic [Meloxicam] Other (See Comments)    Unsure of reaction type   Sulfamethoxazole-Trimethoprim Nausea Only        PHYSICAL EXAMINATION: Vital signs: BP 122/80   Pulse 76   Ht '5\' 2"'$  (1.575 m)   Wt 139 lb 8 oz (63.3 kg)   BMI 25.51 kg/m   Constitutional: generally well-appearing, no acute distress Psychiatric: alert and oriented x3, cooperative Eyes: extraocular movements intact, anicteric, conjunctiva pink Mouth: oral pharynx moist, no lesions Neck: supple no lymphadenopathy Cardiovascular: heart regular rate and rhythm, no murmur Lungs: clear to auscultation bilaterally Abdomen: soft, nontender, nondistended, no obvious ascites, no peritoneal signs, normal bowel sounds, no organomegaly Rectal: Omitted Extremities: no clubbing or cyanosis.  1+ lower extremity edema bilaterally with stasis changes Skin: no relevant lesions on visible extremities Neuro: No focal deficits.  Cranial nerves intact  ASSESSMENT:  1.  Irritable bowel syndrome.  Constipation predominant.  Episodic diarrheal episodes as described.  No alarm features. 2.  History of diverticulitis.  CT documented March 2023 3.  Colonoscopy 2013 with Dr. Earlean Shawl revealed left-sided diverticulosis and large internal hemorrhoids 4.  Multiple medical problems under the supervision of Dr.  Plotnikov  PLAN:  1.  Hold MiraLAX 2.  Initiate Citrucel 2 tablespoons daily in 14 ounces of water or juice 3.  Continue dicyclomine as needed for pain 4.  Continue Zofran as needed for nausea 5.  Ongoing general medical care with Dr. Alain Marion 6.  GI follow-up as needed

## 2022-08-17 ENCOUNTER — Other Ambulatory Visit: Payer: Self-pay

## 2022-08-17 DIAGNOSIS — D509 Iron deficiency anemia, unspecified: Secondary | ICD-10-CM

## 2022-08-20 ENCOUNTER — Inpatient Hospital Stay: Payer: Medicare Other | Attending: Hematology | Admitting: Hematology

## 2022-08-20 ENCOUNTER — Other Ambulatory Visit: Payer: Self-pay

## 2022-08-20 ENCOUNTER — Inpatient Hospital Stay: Payer: Medicare Other

## 2022-08-20 VITALS — BP 133/90 | HR 80 | Temp 98.1°F | Resp 16 | Wt 141.4 lb

## 2022-08-20 DIAGNOSIS — D681 Hereditary factor XI deficiency: Secondary | ICD-10-CM

## 2022-08-20 DIAGNOSIS — D509 Iron deficiency anemia, unspecified: Secondary | ICD-10-CM

## 2022-08-20 DIAGNOSIS — Z79899 Other long term (current) drug therapy: Secondary | ICD-10-CM | POA: Insufficient documentation

## 2022-08-20 LAB — CMP (CANCER CENTER ONLY)
ALT: 14 U/L (ref 0–44)
AST: 28 U/L (ref 15–41)
Albumin: 3.9 g/dL (ref 3.5–5.0)
Alkaline Phosphatase: 66 U/L (ref 38–126)
Anion gap: 7 (ref 5–15)
BUN: 30 mg/dL — ABNORMAL HIGH (ref 8–23)
CO2: 25 mmol/L (ref 22–32)
Calcium: 9.3 mg/dL (ref 8.9–10.3)
Chloride: 107 mmol/L (ref 98–111)
Creatinine: 1.15 mg/dL — ABNORMAL HIGH (ref 0.44–1.00)
GFR, Estimated: 47 mL/min — ABNORMAL LOW (ref >60)
Glucose, Bld: 100 mg/dL — ABNORMAL HIGH (ref 70–99)
Potassium: 4.2 mmol/L (ref 3.5–5.1)
Sodium: 139 mmol/L (ref 135–145)
Total Bilirubin: 0.5 mg/dL (ref 0.3–1.2)
Total Protein: 6.5 g/dL (ref 6.5–8.1)

## 2022-08-20 LAB — IRON AND IRON BINDING CAPACITY (CC-WL,HP ONLY)
Iron: 54 ug/dL (ref 28–170)
Saturation Ratios: 20 % (ref 10.4–31.8)
TIBC: 273 ug/dL (ref 250–450)
UIBC: 219 ug/dL (ref 148–442)

## 2022-08-20 LAB — CBC WITH DIFFERENTIAL (CANCER CENTER ONLY)
Abs Immature Granulocytes: 0.01 10*3/uL (ref 0.00–0.07)
Basophils Absolute: 0 10*3/uL (ref 0.0–0.1)
Basophils Relative: 1 %
Eosinophils Absolute: 0.3 10*3/uL (ref 0.0–0.5)
Eosinophils Relative: 8 %
HCT: 37.9 % (ref 36.0–46.0)
Hemoglobin: 12.7 g/dL (ref 12.0–15.0)
Immature Granulocytes: 0 %
Lymphocytes Relative: 22 %
Lymphs Abs: 0.9 10*3/uL (ref 0.7–4.0)
MCH: 31.8 pg (ref 26.0–34.0)
MCHC: 33.5 g/dL (ref 30.0–36.0)
MCV: 95 fL (ref 80.0–100.0)
Monocytes Absolute: 0.6 10*3/uL (ref 0.1–1.0)
Monocytes Relative: 14 %
Neutro Abs: 2.3 10*3/uL (ref 1.7–7.7)
Neutrophils Relative %: 55 %
Platelet Count: 178 10*3/uL (ref 150–400)
RBC: 3.99 MIL/uL (ref 3.87–5.11)
RDW: 14.4 % (ref 11.5–15.5)
WBC Count: 4.1 10*3/uL (ref 4.0–10.5)
nRBC: 0 % (ref 0.0–0.2)

## 2022-08-20 LAB — FERRITIN: Ferritin: 131 ng/mL (ref 11–307)

## 2022-08-20 NOTE — Progress Notes (Signed)
HEMATOLOGY/ONCOLOGY CLINIC NOTE  Date of Service: 08/20/2022  Patient Care Team: Cassandria Anger, MD as PCP - General Tamala Julian Lynnell Dike, MD as PCP - Cardiology (Cardiology) Vickie Epley, MD as PCP - Electrophysiology (Cardiology) Irene Shipper, MD as Consulting Physician (Gastroenterology) Darleen Crocker, MD as Consulting Physician (Ophthalmology) Szabat, Darnelle Maffucci, [facility] (Inactive) as Pharmacist (Pharmacist) Brunetta Genera, MD as Consulting Physician (Hematology)  CHIEF COMPLAINTS/PURPOSE OF CONSULTATION:  Management of iron deficiency anemia  HISTORY OF PRESENTING ILLNESS:  Please see previous note for details on initial presentation  INTERVAL HISTORY: Hailey [name] is a 87 y.o. female with history of mild factor XI  deficiency here for evaluation and management of iron deficiency anemia.   Patient was last seen by me on 05/29/2022 and was doing well overall.   Patient has been doing well without any new medical concerns since our last visit. She denies abnormal blood, fever, chills, abdominal pain, back pain, night sweats, or leg swelling. She reports she had UTI and ear infection since our last visit. She still complains of diarrhea and she is following up with her physician for it.   She also reports that her blood pressure was low, so her PCP adjusted her medication. She is complaint with all medications.   MEDICAL HISTORY:  Past Medical History:  Diagnosis Date   Acute blood loss anemia 12/25/2017   Allergic rhinitis    Anxiety    Arthritis    "my whole spine" (07/01/2017)   Atrial fibrillation (HCC)    Bowel obstruction (HCC)    in [place]   Bradycardia with 41-50 beats per minute 12/27/2017   Cancer ([place])    Cellulitis of leg, right with large prepatella hematoma and open wounds 12/26/2017   CKD (chronic kidney disease) stage 2, GFR 60-89 ml/min    Colon polyps    Coronary artery disease    10/18 PCI/DES to p/m LCx with cutting balloon to mLcx    Diverticulosis of colon    GERD (gastroesophageal reflux disease)    Hip bursitis 2010   Dr Para March, Post op seroma   History of colon polyps    HSV (herpes simplex virus) anogenital infection 07/2019   HTN (hypertension)    IBS (irritable bowel syndrome)    constipation predominant - Dr Earlean Shawl   Lichen sclerosus    Osteopenia 11/2016   T score -2.0 FRAX 15%/4.3%   PAC (premature atrial contraction)    Symptomatiic   Renal insufficiency    Right bundle branch block (RBBB) on electrocardiogram (ECG) 12/27/2017   Scoliosis    SVT (supraventricular tachycardia)    brief history   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   biopsy showing vulvar atypia, possible VIN I    SURGICAL HISTORY: Past Surgical History:  Procedure Laterality Date   ANTERIOR AND POSTERIOR VAGINAL REPAIR  01/2002   Archie Endo 01/23/2011   APPENDECTOMY  1948   CARDIAC CATHETERIZATION  06/26/2017   CORONARY ANGIOPLASTY WITH STENT PLACEMENT  07/01/2017   CORONARY ATHERECTOMY N/A 07/01/2017   Procedure: CORONARY ATHERECTOMY;  Surgeon: Belva Crome, MD;  Location: [facility];  Service: Cardiovascular;  Laterality: N/A;   CORONARY STENT INTERVENTION N/A 07/01/2017   Procedure: CORONARY STENT INTERVENTION;  Surgeon: Belva Crome, MD;  Location: [place] [facility];  Service: Cardiovascular;  Laterality: N/A;   HAMMER TOE SURGERY     HEMORRHOID BANDING     HIP SURGERY Left 04/2009   hip examination  under anesthesia followed by greater trochanteric bursectomy; iliotibial band tenotomy/notes 01/20/2011   I & D EXTREMITY Right 01/10/2018   Procedure: IRRIGATION AND DEBRIDEMENT RIGHT KNEE, APPLY WOUND VAC;  Surgeon: Newt Minion, MD;  Location: [place];  Service: Orthopedics;  Laterality: Right;   KNEE BURSECTOMY Right 04/2009   Archie Endo 01/09/2011   LEFT ATRIAL APPENDAGE OCCLUSION N/A 08/24/2021   Procedure: LEFT ATRIAL APPENDAGE OCCLUSION;  Surgeon: Sherren Mocha, MD;  Location: [facility] [facility];   Service: Cardiovascular;  Laterality: N/A;   LEFT HEART CATH AND CORONARY ANGIOGRAPHY N/A 06/26/2017   Procedure: LEFT HEART CATH AND CORONARY ANGIOGRAPHY;  Surgeon: Belva Crome, MD;  Location: [place] [facility];  Service: Cardiovascular;  Laterality: N/A;   PUBOVAGINAL SLING  01/2002   Archie Endo 01/23/2011   REDUCTION MAMMAPLASTY     TEE WITHOUT CARDIOVERSION N/A 08/24/2021   Procedure: TRANSESOPHAGEAL ECHOCARDIOGRAM (TEE);  Surgeon: Sherren Mocha, MD;  Location: [place] [facility];  Service: Cardiovascular;  Laterality: N/A;   TEMPORARY PACEMAKER N/A 07/01/2017   Procedure: TEMPORARY PACEMAKER;  Surgeon: Belva Crome, MD;  Location: [facility] [facility];  Service: Cardiovascular;  Laterality: N/A;   VAGINAL HYSTERECTOMY  01/2002   Vaginal hysterectomy, bilateral salpingo-oophorectomy/notes 01/23/2011    SOCIAL HISTORY: Social History   Socioeconomic History   Marital status: Married    Spouse name: [name]   Number of children: 2   Years of education: Not on file   Highest education level: Not on file  Occupational History   Occupation: Patent attorney: RETIRED  Tobacco Use   Smoking status: Former    Packs/day: 0.25    Years: 28.00    Total pack years: 7.00    Types: Cigarettes    Quit date: 1981    Years since quitting: 42.9   Smokeless tobacco: Never  Vaping Use   Vaping Use: Never used  Substance and Sexual Activity   Alcohol use: Yes    Comment: 7 vodka drinks a week   Drug use: Never   Sexual activity: Not Currently    Birth control/protection: Surgical    Comment: hysterectomy  Other Topics Concern   Not on file  Social History Narrative   Regular Exercise -  YES         Social Determinants of Health   Financial Resource Strain: Low Risk  (02/14/2022)   Overall Financial Resource Strain (CARDIA)    Difficulty of Paying Living Expenses: Not hard at all  Food Insecurity: No Food Insecurity (02/14/2022)   Hunger Vital Sign    Worried About Running  Out of Food in the Last Year: Never true    Ran Out of Food in the Last Year: Never true  Transportation Needs: No Transportation Needs (02/14/2022)   PRAPARE - Hydrologist (Medical): No    Lack of Transportation (Non-Medical): No  Physical Activity: Inactive (02/14/2022)   Exercise Vital Sign    Days of Exercise per Week: 0 days    Minutes of Exercise per Session: 0 min  Stress: No Stress Concern Present (02/14/2022)   [place]    Feeling of Stress : Only a little  Social Connections: Moderately Integrated (02/14/2022)   Social Connection and Isolation Panel [NHANES]    Frequency of Communication with Friends and Family: More than three times a week    Frequency of Social Gatherings with Friends and Family: Three times a  week    Attends Religious Services: More than 4 times per year    Active Member of Clubs or Organizations: No    Attends Archivist Meetings: Never    Marital Status: Married  Human resources officer Violence: Not At Risk (02/14/2022)   Humiliation, Afraid, Rape, and Kick questionnaire    Fear of Current or Ex-Partner: No    Emotionally Abused: No    Physically Abused: No    Sexually Abused: No    FAMILY HISTORY: Family History  Problem Relation Age of Onset   Colon cancer Mother        Dx age 64, died at age 60   Diabetes Father    Prostate cancer Father    Prostate cancer Brother    Pancreatic cancer Brother    Stomach cancer Son     ALLERGIES:  is allergic to macrobid [nitrofurantoin monohyd macro], meloxicam, digoxin and related, diprolene [betamethasone dipropionate aug], ferrous sulfate, keflex [cephalexin], mobic [meloxicam], and sulfamethoxazole-trimethoprim.  MEDICATIONS:  Current Outpatient Medications  Medication Sig Dispense Refill   acetaminophen (TYLENOL) 500 MG tablet Take 1,000 mg by mouth every 6 (six) hours as needed (body aches / sore hip).  Maximum of 6 tablets daily ('3000mg'$ )     amoxicillin (AMOXIL) 500 MG capsule Take 1 capsule (500 mg total) by mouth 3 (three) times daily. One capsule TID for 7 Days (Patient not taking: Reported on 08/15/2022) 21 capsule 0   apixaban (ELIQUIS) 2.5 MG TABS tablet TAKE 1 TABLET BY MOUTH TWICE  DAILY 200 tablet 1   atorvastatin (LIPITOR) 40 MG tablet Take 1 tablet (40 mg total) by mouth every Monday, Wednesday, and Friday. 45 tablet 2   BIOTIN PO Take 5,000 mcg by mouth in the morning.     Cholecalciferol (VITAMIN D3) 50 MCG (2000 UT) capsule Take 1 capsule (2,000 Units total) by mouth daily. 100 capsule 3   Cyanocobalamin (VITAMIN B-12) 1000 MCG SUBL Place 1 tablet (1,000 mcg total) under the tongue daily. 100 tablet 3   diclofenac Sodium (VOLTAREN) 1 % GEL Apply 2 g topically 4 (four) times daily as needed (back pain.).     dicyclomine (BENTYL) 20 MG tablet Take 1 by mouth every 4-6 hours as needed for cramping 30 tablet 3   DULoxetine (CYMBALTA) 20 MG capsule Take 40 mg x 1 week, then 20 mg/day 90 capsule 3   empagliflozin (JARDIANCE) 10 MG TABS tablet Take 1 tablet (10 mg total) by mouth daily before breakfast. 30 tablet 9   esomeprazole (NEXIUM) 40 MG capsule TAKE ONE CAPSULE ONCE DAILY. 90 capsule 1   Homeopathic Products (ARNICARE) GEL Apply 1 application topically daily as needed (skin bruising).     hyoscyamine (LEVSIN) 0.125 MG tablet TAKE 1-2 TABLETS (0.125-0.25 MG) BY MOUTH EVERY 4 HOURS AS NEEDED FOR UP TO 10 DAYS FOR CRAMPING. (Patient taking differently: Take 0.125-0.25 mg by mouth every 4 (four) hours as needed for cramping.) 100 tablet 1   LORazepam (ATIVAN) 1 MG tablet TAKE ONE TABLET BY MOUTH TWICE DAILY AS NEEDED FOR ANXIETY / SLEEP. 180 tablet 1   losartan (COZAAR) 25 MG tablet Take 0.5 tablets (12.5 mg total) by mouth daily. 45 tablet 3   metoprolol succinate (TOPROL-XL) 25 MG 24 hr tablet Take 0.5 tablets (12.5 mg total) by mouth daily. 45 tablet 3   Multiple Vitamins-Minerals  (PRESERVISION AREDS 2+MULTI VIT) CAPS Take 1 capsule by mouth daily.     nitroGLYCERIN (NITROSTAT) 0.4 MG SL tablet Place 0.4 mg  under the tongue every 5 (five) minutes as needed for chest pain (Call 911 at 3rd dose within 15 minutes.).     ondansetron (ZOFRAN-ODT) 8 MG disintegrating tablet Take 1 tablet (8 mg total) by mouth every 8 (eight) hours as needed for nausea or vomiting. 12 tablet 0   Polyethyl Glycol-Propyl Glycol (LUBRICANT EYE DROPS) 0.4-0.3 % SOLN Place 1-2 drops into both eyes at bedtime.     polyethylene glycol (MIRALAX / GLYCOLAX) 17 g packet Take 17 g by mouth daily as needed for mild constipation.     saccharomyces boulardii (FLORASTOR) 250 MG capsule Take 1 capsule (250 mg total) by mouth 2 (two) times daily. (Patient taking differently: Take 250 mg by mouth daily.) 60 capsule 2   sodium chloride (OCEAN) 0.65 % SOLN nasal spray Place 1 spray into both nostrils at bedtime as needed for congestion.     No current facility-administered medications for this visit.    REVIEW OF SYSTEMS:    10 Point review of Systems was done is negative except as noted above.  PHYSICAL EXAMINATION:  .There were no vitals taken for this visit. NAD GENERAL:alert, in no acute distress and comfortable SKIN: no acute rashes, no significant lesions EYES: conjunctiva are pink and non-injected, sclera anicteric NECK: supple, no JVD LYMPH:  no palpable lymphadenopathy in the cervical, axillary or inguinal regions LUNGS: clear to auscultation b/l with normal respiratory effort HEART: regular rate & rhythm ABDOMEN:  normoactive bowel sounds , non tender, not distended. Extremity: no pedal edema PSYCH: alert & oriented x 3 with fluent speech NEURO: no focal motor/sensory deficits  LABORATORY DATA:  I have reviewed the data as listed  .    Latest Ref Rng & Units 05/29/2022    2:38 PM 05/03/2022    3:33 PM 04/27/2022    6:23 AM  CBC  WBC 4.0 - 10.5 K/uL 5.9  7.9    Hemoglobin 12.0 - 15.0 g/dL  11.1  8.2 Repeated and verified X2.  8.3   Hematocrit 36.0 - 46.0 % 34.5  25.1  24.3   Platelets 150 - 400 K/uL 200  344.0      .    Latest Ref Rng & Units 05/29/2022    2:38 PM 05/03/2022    3:33 PM 04/27/2022    6:23 AM  CMP  Glucose 70 - 99 mg/dL 115  100  105   BUN 8 - 23 mg/dL '28  21  14   '$ Creatinine 0.44 - 1.00 mg/dL 1.15  1.03  0.88   Sodium 135 - 145 mmol/L 136  137  135   Potassium 3.5 - 5.1 mmol/L 4.3  4.4  3.9   Chloride 98 - 111 mmol/L 108  104  112   CO2 22 - 32 mmol/L '24  25  19   '$ Calcium 8.9 - 10.3 mg/dL 8.8  8.6  8.4   Total Protein 6.5 - 8.1 g/dL 6.5  6.5    Total Bilirubin 0.3 - 1.2 mg/dL 0.7  0.9    Alkaline Phos 38 - 126 U/L 66  66    AST 15 - 41 U/L 18  21    ALT 0 - 44 U/L 7  12     Component     Latest Ref Rng & Units 12/02/2019  Prothrombin Time     11.4 - 15.2 seconds 13.9  INR     0.8 - 1.2 1.1  Factor XI Activity     60 - 150 %  48 (L)  APTT     24 - 36 seconds 52 (H)  . Lab Results  Component Value Date   IRON 101 05/29/2022   TIBC 297 05/29/2022   IRONPCTSAT 34 (H) 05/29/2022   (Iron and TIBC)  Lab Results  Component Value Date   FERRITIN 173 05/29/2022     RADIOGRAPHIC STUDIES: I have personally reviewed the radiological images as listed and agreed with the findings in the report. No results found.  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 Mild with FXI activity levels @ 48%   2) Iron deficiency anemia Likely from GI losses.  Patient recently had acute diverticulitis. Celiac disease has been ruled out previously.  PLAN: -Discussed lab results from today with the patient. CBC stable. CMP shows blood glucose of 100 and creatinine of 1.15. -ferritin 131 with iron saturation of 20%-- no indication for additional IV Iron at this time. -She continues to follow up with gastroenterology Dr. Scarlette Shorts MD for optimization of GI treatment and management of  symptoms.  FOLLOW UP: RTC with Dr Irene Limbo with labs in 6 months  The total time spent in the appointment was 20 minutes* .  All of the patient's questions were answered with apparent satisfaction. The patient knows to call the clinic with any problems, questions or concerns.   Sullivan Lone MD MS AAHIVMS [facility] Hematology/Oncology Physician [facility]  .*Total Encounter Time as defined by the Centers for Medicare and Medicaid Services includes, in addition to the face-to-face time of a patient visit (documented in the note above) non-face-to-face time: obtaining and reviewing outside history, ordering and reviewing medications, tests or procedures, care coordination (communications with other health care professionals or caregivers) and documentation in the medical record.   I, Cleda Mccreedy, am acting as a Education administrator for Sullivan Lone, MD.,  .I have reviewed the above documentation for accuracy and completeness, and I agree with the above. Brunetta Genera MD

## 2022-08-24 ENCOUNTER — Encounter: Payer: Self-pay | Admitting: Physical Medicine and Rehabilitation

## 2022-08-24 ENCOUNTER — Ambulatory Visit: Payer: Medicare Other | Admitting: Physical Medicine and Rehabilitation

## 2022-08-24 DIAGNOSIS — M5416 Radiculopathy, lumbar region: Secondary | ICD-10-CM | POA: Diagnosis not present

## 2022-08-24 DIAGNOSIS — G8929 Other chronic pain: Secondary | ICD-10-CM

## 2022-08-24 DIAGNOSIS — M48061 Spinal stenosis, lumbar region without neurogenic claudication: Secondary | ICD-10-CM

## 2022-08-24 DIAGNOSIS — M5441 Lumbago with sciatica, right side: Secondary | ICD-10-CM

## 2022-08-24 DIAGNOSIS — M419 Scoliosis, unspecified: Secondary | ICD-10-CM

## 2022-08-24 NOTE — Progress Notes (Unsigned)
Hailey [name] - 87 y.o. female MRN [id number]  Date of birth: Jun 16, 1935  Office Visit Note: Visit Date: 08/24/2022 PCP: Cassandria Anger, MD Referred by: Cassandria Anger, MD  Subjective: Chief Complaint  Patient presents with   Lower Back - Pain   HPI: Hailey [name] is a 87 y.o. female who comes in today for evaluation of chronic, worsening and severe bilateral lower back pain radiating to right lateral leg down to knee. Pain ongoing for several years and worsens with prolonged sitting and activity, she describes pain as sore and aching, currently rates as 7 out of 10. Some relief of pain with home exercise regimen, rest and use of medications.  Patient is currently working with a Product/process development scientist several times a week to improve balance, posture and strength. Patient has completed regimen of formal physical therapy with [facility] in the fall of this year, this was more for balance/gait issues. Patients lumbar MRI imaging from March exhibits scoliotic thoracolumbar curvature, multi-level facet arthropathy, mild-moderate canal stenosis with moderate right foraminal stenosis at L3-L4 and mild canal stenosis and moderate bilateral foraminal stenosis at L2-L3. No high grade spinal canal stenosis noted. Patient underwent right L4 transforaminal epidural steroid injection in our office on 06/21/2022. She reports greater than 90% relief of pain for approximately 2 weeks with this procedure. Patient is traveling to [place] in February and would like to discuss treatment options. Patient denies focal weakness, numbness and tingling. Patient denies recent trauma or falls.    Review of Systems  Musculoskeletal:  Positive for back pain.  Neurological:  Negative for tingling, sensory change, focal weakness and weakness.  All other systems reviewed and are negative.  Otherwise per HPI.  Assessment & Plan: Visit Diagnoses:    ICD-10-CM   1. Chronic bilateral low  back pain with right-sided sciatica  M54.41 Ambulatory referral to Physical Medicine Rehab   G89.29     2. Lumbar radiculopathy  M54.16 Ambulatory referral to Physical Medicine Rehab    3. Foraminal stenosis of lumbar region  M48.061 Ambulatory referral to Physical Medicine Rehab    4. Scoliosis of lumbar spine, unspecified scoliosis type  M41.9 Ambulatory referral to Physical Medicine Rehab       Plan: Findings:  Chronic, worsening and severe bilateral lower back pain radiating to right lateral leg down to knee.  Patient continues to have severe pain despite good conservative therapy such as formal physical therapy, home exercise regimen, rest and use of medications.  Patient's clinical presentation and exam are consistent with right L4/L5 nerve pattern.  Greater than 90% relief of pain for 2 weeks with previous epidural steroid injection.  Next step is to repeat right L4 transforaminal epidural steroid injection under fluoroscopic guidance.  If good and lasting relief we can repeat this procedure infrequently as needed.  I did discuss regrouping with physical therapy, however patient would like to continue with her trainer at this time.  We feel that we can get patient in for her injection before she leaves for her trip in February.  No red flag symptoms noted upon exam today.    Meds & Orders: No orders of the defined types were placed in this encounter.   Orders Placed This Encounter  Procedures   Ambulatory referral to Physical Medicine Rehab    Follow-up: Return for Right L4 transforaminal epidural steroid injection .   Procedures: No procedures performed      Clinical History: EXAM: MRI LUMBAR SPINE  WITHOUT CONTRAST   TECHNIQUE: Multiplanar, multisequence MR imaging of the lumbar spine was performed. No intravenous contrast was administered.   COMPARISON:  MR lumbar 03/20/2017; X-ray lumbar 09/14/2021.   FINDINGS: Segmentation:  Standard.   Alignment: Scoliotic  thoracolumbar curvature. Mild retrolisthesis at L1-2, L2-3, and L3-4.   Vertebrae: No fracture, evidence of discitis, or suspicious bone lesion. Scattered intraosseous hemangiomas.   Conus medullaris and cauda equina: Conus extends to the L1 level. Conus and cauda equina appear normal.   Paraspinal and other soft tissues: No acute findings.   Disc levels:   T12-L1: Mild disc bulge. No foraminal or canal stenosis. Unchanged.   L1-L2: Disc height loss with mild disc bulge and endplate ridging. Mild left foraminal stenosis. No canal stenosis. Unchanged.   L2-L3: Retrolisthesis. Diffuse disc bulge with small caudally extending right paracentral disc extrusion. Mild bilateral facet arthropathy. Mild canal stenosis with moderate bilateral foraminal stenosis. Findings have progressed from prior.   L3-L4: Diffuse disc bulge with mild bilateral facet arthropathy and ligamentum flavum buckling. Findings result in mild-moderate canal stenosis with moderate right foraminal stenosis. Findings progressed from prior.   L4-L5: Disc bulge with endplate osteophytic ridging. Advanced bilateral facet arthropathy. Severe right and mild left foraminal stenosis. No significant interval progression.   L5-S1: Disc height loss and endplate ridging. Mild facet hypertrophy. No canal stenosis. Mild-to-moderate left and mild right foraminal stenosis. Unchanged.   IMPRESSION: 1. Multilevel lumbar spondylosis, slightly progressed from prior MRI. 2. Mild-moderate canal stenosis with moderate right foraminal stenosis at L3-4. 3. Mild canal stenosis and moderate bilateral foraminal stenosis at L2-3. 4. Severe right and mild left foraminal stenosis at L4-5.     Electronically Signed   By: Davina Poke D.O.   On: 11/29/2021 13:23   She reports that she quit smoking about 42 years ago. Her smoking use included cigarettes. She has a 7.00 pack-year smoking history. She has never used smokeless tobacco.  No results for input(s): "HGBA1C", "LABURIC" in the last 8760 hours.  Objective:  VS:  HT:    WT:   BMI:     BP:   HR: bpm  TEMP: ( )  RESP:  Physical Exam Vitals and nursing note reviewed.  HENT:     Head: Normocephalic and atraumatic.     Right Ear: External ear normal.     Left Ear: External ear normal.     Nose: Nose normal.     Mouth/Throat:     Mouth: Mucous membranes are moist.  Eyes:     Extraocular Movements: Extraocular movements intact.  Cardiovascular:     Rate and Rhythm: Normal rate.     Pulses: Normal pulses.  Pulmonary:     Effort: Pulmonary effort is normal.  Abdominal:     General: Abdomen is flat. There is no distension.  Musculoskeletal:        General: Tenderness present.     Cervical back: Normal range of motion.     Comments: Pt is slow to rise from seated position to standing. Good lumbar range of motion. Strong distal strength without clonus, no pain upon palpation of greater trochanters. Sensation intact bilaterally. Walks independently, gait steady.   Skin:    General: Skin is warm and dry.     Capillary Refill: Capillary refill takes less than 2 seconds.  Neurological:     General: No focal deficit present.     Mental Status: She is alert and oriented to person, place, and time.  Psychiatric:  Mood and Affect: Mood normal.        Behavior: Behavior normal.     Ortho Exam  Imaging: No results found.  Past Medical/Family/Surgical/Social History: Medications & Allergies reviewed per EMR, new medications updated. Patient Active Problem List   Diagnosis Date Noted   Osteoporosis 06/11/2022   Hematoma of right thigh 04/25/2022   Weakness 04/25/2022   Weakness generalized 04/25/2022   Falls frequently 04/19/2022   Iron deficiency anemia 12/08/2021   Herpesviral infection, unspecified 11/22/2021   Skin ulcer, limited to breakdown of skin ([place]) 11/22/2021   Anemia, iron deficiency 11/22/2021   Low serum vitamin B12 11/22/2021    Acute renal failure superimposed on stage 2 chronic kidney disease ([place]) 11/20/2021   ABLA (acute blood loss anemia) 11/20/2021   UTI (urinary tract infection) 11/20/2021   Acute diverticulitis 11/20/2021   Hypotension 11/19/2021   Shortness of breath 06/12/2021   Right leg pain 02/21/2021   Diverticulitis 01/08/2021   Acute respiratory failure with hypoxia (HCC)    Bacterial colitis    Abnormal CT of the abdomen    Diarrhea of infectious origin    Acute colitis 08/27/2020   Aortic atherosclerosis ([place]) 08/27/2020   Acute prerenal azotemia 08/27/2020   Shock circulatory ([place]) 15/40/0867   Metabolic acidosis with normal anion gap and bicarbonate losses 08/27/2020   Dehydration    Squamous cell cancer of skin of left cheek 61/95/0932   Lichen sclerosus 67/08/4579   Abdominal cramping 03/16/2020   Neurogenic orthostatic hypotension ([place]) 09/16/2019   Injury of face 08/31/2019   Leg abrasion, infected, right, subsequent encounter 08/28/2019   Face lacerations 08/26/2019   Nose fracture 08/26/2019   Concussion 08/26/2019   Wrist pain, acute, right 08/26/2019   Greater trochanteric pain syndrome of right lower extremity 07/30/2019   Viral URI with cough 08/15/2018   Abdominal pain 05/22/2018   Traumatic hematoma of right knee 01/10/2018   Ulcer of right knee (HCC)    Bradycardia with 41-50 beats per minute 12/27/2017   Right bundle branch block (RBBB) on electrocardiogram (ECG) 12/27/2017   Quadriceps tendon rupture 12/24/2017   Chronic kidney disease, stage 3b ([place]) 12/24/2017   Cellulitis of leg, right with large prepatella hematoma and open wounds 12/24/2017   Iliotibial band syndrome of right side 12/16/2017   Depression 07/11/2017   Intervertebral lumbar disc disorder with myelopathy, lumbar region 04/25/2017   Family history of colon cancer in mother 01/22/2017   Primary osteoarthritis of both first carpometacarpal joints 01/02/2017   Pain 11/21/2016   Gastroenteritis  09/13/2016   Chronic anticoagulation 05/24/2015   Coronary artery disease involving native coronary artery of native heart with angina pectoris ([place]) 12/20/2014   Iliotibial band syndrome of left side 11/16/2014   Diverticulitis of colon 08/30/2014   Chest pain, atypical 07/03/2014   Diarrhea 12/22/2013   Permanent atrial fibrillation ([place]) 11/21/2012   Greater trochanteric bursitis of both hips 05/21/2012   Dizzinesses 11/07/2011   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   Osteoarthritis of left hip 07/03/2010   DEGENERATIVE DISC DISEASE, LUMBOSACRAL SPINE 05/18/2010   SYNCOPE 10/27/2008   Essential hypertension 06/16/2007   GERD (gastroesophageal reflux disease) 06/16/2007   COLONIC POLYPS, HX OF 06/16/2007   Past Medical History:  Diagnosis Date   Acute blood loss anemia 12/25/2017   Allergic rhinitis    Anxiety    Arthritis    "my whole spine" (07/01/2017)   Atrial fibrillation ([place])    Bowel obstruction ([place])    in [place]  Bradycardia with 41-50 beats per minute 12/27/2017   Cancer (HCC)    Cellulitis of leg, right with large prepatella hematoma and open wounds 12/26/2017   CKD (chronic kidney disease) stage 2, GFR 60-89 ml/min    Colon polyps    Coronary artery disease    10/18 PCI/DES to p/m LCx with cutting balloon to mLcx   Diverticulosis of colon    GERD (gastroesophageal reflux disease)    Hip bursitis 2010   Dr Para March, Post op seroma   History of colon polyps    HSV (herpes simplex virus) anogenital infection 07/2019   HTN (hypertension)    IBS (irritable bowel syndrome)    constipation predominant - Dr Earlean Shawl   Lichen sclerosus    Osteopenia 11/2016   T score -2.0 FRAX 15%/4.3%   PAC (premature atrial contraction)    Symptomatiic   Renal insufficiency    Right bundle branch block (RBBB) on electrocardiogram (ECG) 12/27/2017   Scoliosis    SVT (supraventricular tachycardia)    brief history   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    biopsy showing vulvar atypia, possible VIN I   Family History  Problem Relation Age of Onset   Colon cancer Mother        Dx age 80, died at age 54   Diabetes Father    Prostate cancer Father    Prostate cancer Brother    Pancreatic cancer Brother    Stomach cancer Son    Past Surgical History:  Procedure Laterality Date   ANTERIOR AND POSTERIOR VAGINAL REPAIR  01/2002   Archie Endo 01/23/2011   APPENDECTOMY  1948   CARDIAC CATHETERIZATION  06/26/2017   CORONARY ANGIOPLASTY WITH STENT PLACEMENT  07/01/2017   CORONARY ATHERECTOMY N/A 07/01/2017   Procedure: CORONARY ATHERECTOMY;  Surgeon: Belva Crome, MD;  Location: [facility];  Service: Cardiovascular;  Laterality: N/A;   CORONARY STENT INTERVENTION N/A 07/01/2017   Procedure: CORONARY STENT INTERVENTION;  Surgeon: Belva Crome, MD;  Location: [facility];  Service: Cardiovascular;  Laterality: N/A;   HAMMER TOE SURGERY     HEMORRHOID BANDING     HIP SURGERY Left 04/2009   hip examination under anesthesia followed by greater trochanteric bursectomy; iliotibial band tenotomy/notes 01/20/2011   I & D EXTREMITY Right 01/10/2018   Procedure: IRRIGATION AND DEBRIDEMENT RIGHT KNEE, APPLY WOUND VAC;  Surgeon: Newt Minion, MD;  Location: [facility];  Service: Orthopedics;  Laterality: Right;   KNEE BURSECTOMY Right 04/2009   Archie Endo 01/09/2011   LEFT ATRIAL APPENDAGE OCCLUSION N/A 08/24/2021   Procedure: LEFT ATRIAL APPENDAGE OCCLUSION;  Surgeon: Sherren Mocha, MD;  Location: [facility];  Service: Cardiovascular;  Laterality: N/A;   LEFT HEART CATH AND CORONARY ANGIOGRAPHY N/A 06/26/2017   Procedure: LEFT HEART CATH AND CORONARY ANGIOGRAPHY;  Surgeon: Belva Crome, MD;  Location: [facility];  Service: Cardiovascular;  Laterality: N/A;   PUBOVAGINAL SLING  01/2002   Archie Endo 01/23/2011   REDUCTION MAMMAPLASTY     TEE WITHOUT CARDIOVERSION N/A 08/24/2021   Procedure: TRANSESOPHAGEAL ECHOCARDIOGRAM (TEE);  Surgeon:  Sherren Mocha, MD;  Location: [facility];  Service: Cardiovascular;  Laterality: N/A;   TEMPORARY PACEMAKER N/A 07/01/2017   Procedure: TEMPORARY PACEMAKER;  Surgeon: Belva Crome, MD;  Location: [facility];  Service: Cardiovascular;  Laterality: N/A;   VAGINAL HYSTERECTOMY  01/2002   Vaginal hysterectomy, bilateral salpingo-oophorectomy/notes 01/23/2011   Social History   Occupational  History   Occupation: Patent attorney: RETIRED  Tobacco Use   Smoking status: Former    Packs/day: 0.25    Years: 28.00    Total pack years: 7.00    Types: Cigarettes    Quit date: 1981    Years since quitting: 42.9   Smokeless tobacco: Never  Vaping Use   Vaping Use: Never used  Substance and Sexual Activity   Alcohol use: Yes    Comment: 7 vodka drinks a week   Drug use: Never   Sexual activity: Not Currently    Birth control/protection: Surgical    Comment: hysterectomy

## 2022-08-24 NOTE — Progress Notes (Unsigned)
Numeric Pain Rating Scale and Functional Assessment Average Pain 7   In the last MONTH (on 0-10 scale) has pain interfered with the following?  1. General activity like being  able to carry out your everyday physical activities such as walking, climbing stairs, carrying groceries, or moving a chair?  Rating( varies )   Lower back pain that radiates down right leg. Activity can make pain better. Sitting too long can cause pain to increase

## 2022-08-26 ENCOUNTER — Encounter: Payer: Self-pay | Admitting: Hematology

## 2022-08-28 DIAGNOSIS — H02839 Dermatochalasis of unspecified eye, unspecified eyelid: Secondary | ICD-10-CM | POA: Diagnosis not present

## 2022-09-05 ENCOUNTER — Telehealth: Payer: Self-pay | Admitting: Physical Medicine and Rehabilitation

## 2022-09-05 NOTE — Telephone Encounter (Signed)
Patient called in stating she was supposed to receive a call to set up injection in back please advise

## 2022-09-07 ENCOUNTER — Other Ambulatory Visit (INDEPENDENT_AMBULATORY_CARE_PROVIDER_SITE_OTHER): Payer: Medicare Other

## 2022-09-07 DIAGNOSIS — R531 Weakness: Secondary | ICD-10-CM

## 2022-09-07 DIAGNOSIS — D500 Iron deficiency anemia secondary to blood loss (chronic): Secondary | ICD-10-CM

## 2022-09-07 LAB — COMPREHENSIVE METABOLIC PANEL
ALT: 14 U/L (ref 0–35)
AST: 24 U/L (ref 0–37)
Albumin: 4.2 g/dL (ref 3.5–5.2)
Alkaline Phosphatase: 69 U/L (ref 39–117)
BUN: 33 mg/dL — ABNORMAL HIGH (ref 6–23)
CO2: 26 mEq/L (ref 19–32)
Calcium: 9.2 mg/dL (ref 8.4–10.5)
Chloride: 101 mEq/L (ref 96–112)
Creatinine, Ser: 1.27 mg/dL — ABNORMAL HIGH (ref 0.40–1.20)
GFR: 38.49 mL/min — ABNORMAL LOW (ref >60.00)
Glucose, Bld: 87 mg/dL (ref 70–99)
Potassium: 4.7 mEq/L (ref 3.5–5.1)
Sodium: 134 mEq/L — ABNORMAL LOW (ref 135–145)
Total Bilirubin: 0.7 mg/dL (ref 0.2–1.2)
Total Protein: 7.1 g/dL (ref 6.0–8.3)

## 2022-09-07 LAB — CBC WITH DIFFERENTIAL/PLATELET
Basophils Absolute: 0 10*3/uL (ref 0.0–0.1)
Basophils Relative: 0.5 % (ref 0.0–3.0)
Eosinophils Absolute: 0.4 10*3/uL (ref 0.0–0.7)
Eosinophils Relative: 5.7 % — ABNORMAL HIGH (ref 0.0–5.0)
HCT: 40.6 % (ref 36.0–46.0)
Hemoglobin: 13.5 g/dL (ref 12.0–15.0)
Lymphocytes Relative: 24.7 % (ref 12.0–46.0)
Lymphs Abs: 1.6 10*3/uL (ref 0.7–4.0)
MCHC: 33.3 g/dL (ref 30.0–36.0)
MCV: 96.1 fl (ref 78.0–100.0)
Monocytes Absolute: 0.6 10*3/uL (ref 0.1–1.0)
Monocytes Relative: 9.3 % (ref 3.0–12.0)
Neutro Abs: 3.9 10*3/uL (ref 1.4–7.7)
Neutrophils Relative %: 59.8 % (ref 43.0–77.0)
Platelets: 236 10*3/uL (ref 150.0–400.0)
RBC: 4.23 Mil/uL (ref 3.87–5.11)
RDW: 14.7 % (ref 11.5–15.5)
WBC: 6.4 10*3/uL (ref 4.0–10.5)

## 2022-09-12 ENCOUNTER — Ambulatory Visit: Payer: Medicare Other | Admitting: Internal Medicine

## 2022-09-19 ENCOUNTER — Ambulatory Visit: Payer: Self-pay

## 2022-09-19 ENCOUNTER — Ambulatory Visit: Payer: Medicare Other | Admitting: Physical Medicine and Rehabilitation

## 2022-09-19 VITALS — BP 135/89 | HR 68

## 2022-09-19 DIAGNOSIS — M5416 Radiculopathy, lumbar region: Secondary | ICD-10-CM

## 2022-09-19 MED ORDER — METHYLPREDNISOLONE ACETATE 80 MG/ML IJ SUSP
80.0000 mg | Freq: Once | INTRAMUSCULAR | Status: AC
  Administered 2022-09-19: 80 mg

## 2022-09-19 NOTE — Patient Instructions (Signed)

## 2022-09-19 NOTE — Progress Notes (Signed)
Functional Pain Scale - descriptive words and definitions  Distressing (6)    Pain is present/unable to complete most ADLs limited by pain/sleep is difficult and active distraction is only marginal. Moderate range order  Average Pain  varies   +Driver, +BT Eliquis, -Dye Allergies.  Lower back pain on right side, but has some on the left side. Radiation into right leg to the knee

## 2022-09-30 NOTE — Progress Notes (Signed)
Hailey [name] - 88 y.o. female MRN [id number]  Date of birth: 11/05/1934  Office Visit Note: Visit Date: 09/19/2022 PCP: Cassandria Anger, MD Referred by: Cassandria Anger, MD  Subjective: Chief Complaint  Patient presents with   Lower Back - Pain   HPI:  Hailey [name] is a 88 y.o. female who comes in today at the request of Barnet Pall, FNP for planned Right L4-5 Lumbar Transforaminal epidural steroid injection with fluoroscopic guidance.  The patient has failed conservative care including home exercise, medications, time and activity modification.  This injection will be diagnostic and hopefully therapeutic.  Please see requesting physician notes for further details and justification.   ROS Otherwise per HPI.  Assessment & Plan: Visit Diagnoses:    ICD-10-CM   1. Lumbar radiculopathy  M54.16 XR C-ARM NO REPORT    Epidural Steroid injection    methylPREDNISolone acetate (DEPO-MEDROL) injection 80 mg      Plan: No additional findings.   Meds & Orders:  Meds ordered this encounter  Medications   methylPREDNISolone acetate (DEPO-MEDROL) injection 80 mg    Orders Placed This Encounter  Procedures   XR C-ARM NO REPORT   Epidural Steroid injection    Follow-up: Return for visit to requesting provider as needed.   Procedures: No procedures performed  Lumbosacral Transforaminal Epidural Steroid Injection - Sub-Pedicular Approach with Fluoroscopic Guidance  Patient: Hailey [name]      Date of Birth: 05-11-1935 MRN: [id number] PCP: Cassandria Anger, MD      Visit Date: 09/19/2022   Universal Protocol:    Date/Time: 09/19/2022  Consent Given By: the patient  Position: PRONE  Additional Comments: Vital signs were monitored before and after the procedure. Patient was prepped and draped in the usual sterile fashion. The correct patient, procedure, and site was verified.   Injection Procedure Details:   Procedure diagnoses: Lumbar radiculopathy  [M54.16]    Meds Administered:  Meds ordered this encounter  Medications   methylPREDNISolone acetate (DEPO-MEDROL) injection 80 mg    Laterality: Right  Location/Site: L4  Needle:5.0 in., 22 ga.  Short bevel or Quincke spinal needle  Needle Placement: Transforaminal  Findings:    -Comments: Excellent flow of contrast along the nerve, nerve root and into the epidural space.  Procedure Details: After squaring off the end-plates to get a true AP view, the C-arm was positioned so that an oblique view of the foramen as noted above was visualized. The target area is just inferior to the "nose of the scotty dog" or sub pedicular. The soft tissues overlying this structure were infiltrated with 2-3 ml. of 1% Lidocaine without Epinephrine.  The spinal needle was inserted toward the target using a "trajectory" view along the fluoroscope beam.  Under AP and lateral visualization, the needle was advanced so it did not puncture dura and was located close the 6 O'Clock position of the pedical in AP tracterory. Biplanar projections were used to confirm position. Aspiration was confirmed to be negative for CSF and/or blood. A 1-2 ml. volume of Isovue-250 was injected and flow of contrast was noted at each level. Radiographs were obtained for documentation purposes.   After attaining the desired flow of contrast documented above, a 0.5 to 1.0 ml test dose of 0.25% Marcaine was injected into each respective transforaminal space.  The patient was observed for 90 seconds post injection.  After no sensory deficits were reported, and normal lower extremity motor function was noted,   the above injectate  was administered so that equal amounts of the injectate were placed at each foramen (level) into the transforaminal epidural space.   Additional Comments:  No complications occurred Dressing: 2 x 2 sterile gauze and Band-Aid    Post-procedure details: Patient was observed during the  procedure. Post-procedure instructions were reviewed.  Patient left the clinic in stable condition.    Clinical History: EXAM: MRI LUMBAR SPINE WITHOUT CONTRAST   TECHNIQUE: Multiplanar, multisequence MR imaging of the lumbar spine was performed. No intravenous contrast was administered.   COMPARISON:  MR lumbar 03/20/2017; X-ray lumbar 09/14/2021.   FINDINGS: Segmentation:  Standard.   Alignment: Scoliotic thoracolumbar curvature. Mild retrolisthesis at L1-2, L2-3, and L3-4.   Vertebrae: No fracture, evidence of discitis, or suspicious bone lesion. Scattered intraosseous hemangiomas.   Conus medullaris and cauda equina: Conus extends to the L1 level. Conus and cauda equina appear normal.   Paraspinal and other soft tissues: No acute findings.   Disc levels:   T12-L1: Mild disc bulge. No foraminal or canal stenosis. Unchanged.   L1-L2: Disc height loss with mild disc bulge and endplate ridging. Mild left foraminal stenosis. No canal stenosis. Unchanged.   L2-L3: Retrolisthesis. Diffuse disc bulge with small caudally extending right paracentral disc extrusion. Mild bilateral facet arthropathy. Mild canal stenosis with moderate bilateral foraminal stenosis. Findings have progressed from prior.   L3-L4: Diffuse disc bulge with mild bilateral facet arthropathy and ligamentum flavum buckling. Findings result in mild-moderate canal stenosis with moderate right foraminal stenosis. Findings progressed from prior.   L4-L5: Disc bulge with endplate osteophytic ridging. Advanced bilateral facet arthropathy. Severe right and mild left foraminal stenosis. No significant interval progression.   L5-S1: Disc height loss and endplate ridging. Mild facet hypertrophy. No canal stenosis. Mild-to-moderate left and mild right foraminal stenosis. Unchanged.   IMPRESSION: 1. Multilevel lumbar spondylosis, slightly progressed from prior MRI. 2. Mild-moderate canal stenosis with  moderate right foraminal stenosis at L3-4. 3. Mild canal stenosis and moderate bilateral foraminal stenosis at L2-3. 4. Severe right and mild left foraminal stenosis at L4-5.     Electronically Signed   By: Davina Poke D.O.   On: 11/29/2021 13:23     Objective:  VS:  HT:    WT:   BMI:     BP:135/89  HR:68bpm  TEMP: ( )  RESP:  Physical Exam Vitals and nursing note reviewed.  Constitutional:      General: She is not in acute distress.    Appearance: Normal appearance. She is not ill-appearing.  HENT:     Head: Normocephalic and atraumatic.     Right Ear: External ear normal.     Left Ear: External ear normal.  Eyes:     Extraocular Movements: Extraocular movements intact.  Cardiovascular:     Rate and Rhythm: Normal rate.     Pulses: Normal pulses.  Pulmonary:     Effort: Pulmonary effort is normal. No respiratory distress.  Abdominal:     General: There is no distension.     Palpations: Abdomen is soft.  Musculoskeletal:        General: Tenderness present.     Cervical back: Neck supple.     Right lower leg: No edema.     Left lower leg: No edema.     Comments: Patient has good distal strength with no pain over the greater trochanters.  No clonus or focal weakness.  Skin:    Findings: No erythema, lesion or rash.  Neurological:  General: No focal deficit present.     Mental Status: She is alert and oriented to person, place, and time.     Sensory: No sensory deficit.     Motor: No weakness or abnormal muscle tone.     Coordination: Coordination normal.  Psychiatric:        Mood and Affect: Mood normal.        Behavior: Behavior normal.      Imaging: No results found.

## 2022-09-30 NOTE — Procedures (Signed)
Lumbosacral Transforaminal Epidural Steroid Injection - Sub-Pedicular Approach with Fluoroscopic Guidance  Patient: Hailey [name]      Date of Birth: Nov 09, 1934 MRN: [id number] PCP: Cassandria Anger, MD      Visit Date: 09/19/2022   Universal Protocol:    Date/Time: 09/19/2022  Consent Given By: the patient  Position: PRONE  Additional Comments: Vital signs were monitored before and after the procedure. Patient was prepped and draped in the usual sterile fashion. The correct patient, procedure, and site was verified.   Injection Procedure Details:   Procedure diagnoses: Lumbar radiculopathy [M54.16]    Meds Administered:  Meds ordered this encounter  Medications   methylPREDNISolone acetate (DEPO-MEDROL) injection 80 mg    Laterality: Right  Location/Site: L4  Needle:5.0 in., 22 ga.  Short bevel or Quincke spinal needle  Needle Placement: Transforaminal  Findings:    -Comments: Excellent flow of contrast along the nerve, nerve root and into the epidural space.  Procedure Details: After squaring off the end-plates to get a true AP view, the C-arm was positioned so that an oblique view of the foramen as noted above was visualized. The target area is just inferior to the "nose of the scotty dog" or sub pedicular. The soft tissues overlying this structure were infiltrated with 2-3 ml. of 1% Lidocaine without Epinephrine.  The spinal needle was inserted toward the target using a "trajectory" view along the fluoroscope beam.  Under AP and lateral visualization, the needle was advanced so it did not puncture dura and was located close the 6 O'Clock position of the pedical in AP tracterory. Biplanar projections were used to confirm position. Aspiration was confirmed to be negative for CSF and/or blood. A 1-2 ml. volume of Isovue-250 was injected and flow of contrast was noted at each level. Radiographs were obtained for documentation purposes.   After attaining the desired  flow of contrast documented above, a 0.5 to 1.0 ml test dose of 0.25% Marcaine was injected into each respective transforaminal space.  The patient was observed for 90 seconds post injection.  After no sensory deficits were reported, and normal lower extremity motor function was noted,   the above injectate was administered so that equal amounts of the injectate were placed at each foramen (level) into the transforaminal epidural space.   Additional Comments:  No complications occurred Dressing: 2 x 2 sterile gauze and Band-Aid    Post-procedure details: Patient was observed during the procedure. Post-procedure instructions were reviewed.  Patient left the clinic in stable condition.

## 2022-10-01 ENCOUNTER — Other Ambulatory Visit: Payer: Self-pay | Admitting: Interventional Cardiology

## 2022-10-01 ENCOUNTER — Ambulatory Visit (INDEPENDENT_AMBULATORY_CARE_PROVIDER_SITE_OTHER): Payer: Medicare Other | Admitting: Internal Medicine

## 2022-10-01 ENCOUNTER — Encounter: Payer: Self-pay | Admitting: Internal Medicine

## 2022-10-01 VITALS — BP 122/80 | HR 70 | Temp 97.8°F | Ht 62.0 in | Wt 142.0 lb

## 2022-10-01 DIAGNOSIS — I10 Essential (primary) hypertension: Secondary | ICD-10-CM

## 2022-10-01 DIAGNOSIS — N1832 Chronic kidney disease, stage 3b: Secondary | ICD-10-CM

## 2022-10-01 DIAGNOSIS — G4700 Insomnia, unspecified: Secondary | ICD-10-CM | POA: Diagnosis not present

## 2022-10-01 DIAGNOSIS — E538 Deficiency of other specified B group vitamins: Secondary | ICD-10-CM | POA: Diagnosis not present

## 2022-10-01 DIAGNOSIS — L0100 Impetigo, unspecified: Secondary | ICD-10-CM | POA: Insufficient documentation

## 2022-10-01 MED ORDER — ONDANSETRON 8 MG PO TBDP: 8.0000 mg | ORAL_TABLET | Freq: Three times a day (TID) | ORAL | 3 refills | Status: DC | PRN

## 2022-10-01 MED ORDER — NITROGLYCERIN 0.4 MG SL SUBL: 0.4000 mg | SUBLINGUAL_TABLET | SUBLINGUAL | 3 refills | Status: AC | PRN

## 2022-10-01 MED ORDER — MECLIZINE HCL 12.5 MG PO TABS: 12.5000 mg | ORAL_TABLET | Freq: Three times a day (TID) | ORAL | 1 refills | Status: AC | PRN

## 2022-10-01 NOTE — Patient Instructions (Signed)
Sign up for [employer] ( via [employer] on your phone or your ipad). If you don't have a [facility] [employer] card  - go to [employer] branch. They will set you up in 15 minutes. It is free.

## 2022-10-01 NOTE — Assessment & Plan Note (Signed)
Will give SQ B12 today

## 2022-10-01 NOTE — Progress Notes (Signed)
Subjective:  Patient ID: Hailey [name], female    DOB: 09-05-1935  Age: 88 y.o. MRN: [id number]  CC: No chief complaint on file.   HPI Hailey [name] presents for occasional vertigo, anticoagulation, dyslipidemia  Outpatient Medications Prior to Visit  Medication Sig Dispense Refill   acetaminophen (TYLENOL) 500 MG tablet Take 1,000 mg by mouth every 6 (six) hours as needed (body aches / sore hip). Maximum of 6 tablets daily ('3000mg'$ )     apixaban (ELIQUIS) 2.5 MG TABS tablet TAKE 1 TABLET BY MOUTH TWICE  DAILY 200 tablet 1   atorvastatin (LIPITOR) 40 MG tablet Take 1 tablet (40 mg total) by mouth every Monday, Wednesday, and Friday. 45 tablet 2   BIOTIN PO Take 5,000 mcg by mouth in the morning.     Cholecalciferol (VITAMIN D3) 50 MCG (2000 UT) capsule Take 1 capsule (2,000 Units total) by mouth daily. 100 capsule 3   Cyanocobalamin (VITAMIN B-12) 1000 MCG SUBL Place 1 tablet (1,000 mcg total) under the tongue daily. 100 tablet 3   diclofenac Sodium (VOLTAREN) 1 % GEL Apply 2 g topically 4 (four) times daily as needed (back pain.).     dicyclomine (BENTYL) 20 MG tablet Take 1 by mouth every 4-6 hours as needed for cramping 30 tablet 3   DULoxetine (CYMBALTA) 20 MG capsule Take 40 mg x 1 week, then 20 mg/day 90 capsule 3   empagliflozin (JARDIANCE) 10 MG TABS tablet Take 1 tablet (10 mg total) by mouth daily before breakfast. 30 tablet 9   esomeprazole (NEXIUM) 40 MG capsule TAKE ONE CAPSULE ONCE DAILY. 90 capsule 1   Homeopathic Products (ARNICARE) GEL Apply 1 application topically daily as needed (skin bruising).     hyoscyamine (LEVSIN) 0.125 MG tablet TAKE 1-2 TABLETS (0.125-0.25 MG) BY MOUTH EVERY 4 HOURS AS NEEDED FOR UP TO 10 DAYS FOR CRAMPING. (Patient taking differently: Take 0.125-0.25 mg by mouth every 4 (four) hours as needed for cramping.) 100 tablet 1   LORazepam (ATIVAN) 1 MG tablet TAKE ONE TABLET BY MOUTH TWICE DAILY AS NEEDED FOR ANXIETY / SLEEP. 180 tablet 1   losartan  (COZAAR) 25 MG tablet Take 0.5 tablets (12.5 mg total) by mouth daily. 45 tablet 3   metoprolol succinate (TOPROL-XL) 25 MG 24 hr tablet Take 0.5 tablets (12.5 mg total) by mouth daily. 45 tablet 3   Multiple Vitamins-Minerals (PRESERVISION AREDS 2+MULTI VIT) CAPS Take 1 capsule by mouth daily.     Polyethyl Glycol-Propyl Glycol (LUBRICANT EYE DROPS) 0.4-0.3 % SOLN Place 1-2 drops into both eyes at bedtime.     saccharomyces boulardii (FLORASTOR) 250 MG capsule Take 1 capsule (250 mg total) by mouth 2 (two) times daily. (Patient taking differently: Take 250 mg by mouth daily.) 60 capsule 2   sodium chloride (OCEAN) 0.65 % SOLN nasal spray Place 1 spray into both nostrils at bedtime as needed for congestion.     nitroGLYCERIN (NITROSTAT) 0.4 MG SL tablet Place 0.4 mg under the tongue every 5 (five) minutes as needed for chest pain (Call 911 at 3rd dose within 15 minutes.).     ondansetron (ZOFRAN-ODT) 8 MG disintegrating tablet Take 1 tablet (8 mg total) by mouth every 8 (eight) hours as needed for nausea or vomiting. 12 tablet 0   No facility-administered medications prior to visit.    ROS: Review of Systems  Constitutional:  Negative for activity change, appetite change, chills, fatigue and unexpected weight change.  HENT:  Negative for congestion, mouth sores  and sinus pressure.   Eyes:  Negative for visual disturbance.  Respiratory:  Negative for cough and chest tightness.   Gastrointestinal:  Negative for abdominal pain and nausea.  Genitourinary:  Negative for difficulty urinating, frequency and vaginal pain.  Musculoskeletal:  Negative for back pain and gait problem.  Skin:  Negative for pallor and rash.  Neurological:  Negative for dizziness, tremors, weakness, numbness and headaches.  Psychiatric/Behavioral:  Negative for confusion, sleep disturbance and suicidal ideas.     Objective:  BP 122/80 (BP Location: Left Arm, Patient Position: Sitting, Cuff Size: Normal)   Pulse 70    Temp 97.8 F (36.6 C) (Oral)   Ht '5\' 2"'$  (1.575 m)   Wt 142 lb (64.4 kg)   SpO2 96%   BMI 25.97 kg/m   BP Readings from Last 3 Encounters:  10/01/22 122/80  09/19/22 135/89  08/20/22 (!) 133/90    Wt Readings from Last 3 Encounters:  10/01/22 142 lb (64.4 kg)  08/20/22 141 lb 6.4 oz (64.1 kg)  08/15/22 139 lb 8 oz (63.3 kg)    Physical Exam Constitutional:      General: She is not in acute distress.    Appearance: She is well-developed.  HENT:     Head: Normocephalic.     Right Ear: External ear normal.     Left Ear: External ear normal.     Nose: Nose normal.  Eyes:     General:        Right eye: No discharge.        Left eye: No discharge.     Conjunctiva/sclera: Conjunctivae normal.     Pupils: Pupils are equal, round, and reactive to light.  Neck:     Thyroid: No thyromegaly.     Vascular: No JVD.     Trachea: No tracheal deviation.  Cardiovascular:     Rate and Rhythm: Normal rate and regular rhythm.     Heart sounds: Normal heart sounds.  Pulmonary:     Effort: No respiratory distress.     Breath sounds: No stridor. No wheezing.  Abdominal:     General: Bowel sounds are normal. There is no distension.     Palpations: Abdomen is soft. There is no mass.     Tenderness: There is no abdominal tenderness. There is no guarding or rebound.  Musculoskeletal:        General: No tenderness.     Cervical back: Normal range of motion and neck supple. No rigidity.  Lymphadenopathy:     Cervical: No cervical adenopathy.  Skin:    Findings: No erythema or rash.  Neurological:     Mental Status: She is oriented to person, place, and time.     Cranial Nerves: No cranial nerve deficit.     Motor: No abnormal muscle tone.     Coordination: Coordination normal.     Deep Tendon Reflexes: Reflexes normal.  Psychiatric:        Behavior: Behavior normal.        Thought Content: Thought content normal.        Judgment: Judgment normal.     Lab Results  Component  Value Date   WBC 6.4 09/07/2022   HGB 13.5 09/07/2022   HCT 40.6 09/07/2022   PLT 236.0 09/07/2022   GLUCOSE 87 09/07/2022   CHOL 167 01/29/2022   TRIG 113 01/29/2022   HDL 109 01/29/2022   LDLDIRECT 89.1 06/29/2013   LDLCALC 39 01/29/2022   ALT 14 09/07/2022  AST 24 09/07/2022   NA 134 (L) 09/07/2022   K 4.7 09/07/2022   CL 101 09/07/2022   CREATININE 1.27 (H) 09/07/2022   BUN 33 (H) 09/07/2022   CO2 26 09/07/2022   TSH 2.11 04/29/2015   INR 1.1 12/02/2019   HGBA1C 5.6 07/04/2014    No results found.  Assessment & Plan:   Problem List Items Addressed This Visit       Cardiovascular and Mediastinum   Essential hypertension    On metoprolol and Losartan      Relevant Medications   nitroGLYCERIN (NITROSTAT) 0.4 MG SL tablet     Genitourinary   Chronic kidney disease, stage 3b (HCC)    Monitor GFR        Other   Insomnia    Lorazepam prn  Potential benefits of a long term opioids use as well as potential risks (i.e. addiction risk, apnea etc) and complications (i.e. Somnolence, constipation and others) were explained to the patient and were aknowledged.       Low serum vitamin B12 - Primary    Will give SQ B12 today         Meds ordered this encounter  Medications   meclizine (ANTIVERT) 12.5 MG tablet    Sig: Take 1 tablet (12.5 mg total) by mouth 3 (three) times daily as needed for dizziness.    Dispense:  60 tablet    Refill:  1   ondansetron (ZOFRAN-ODT) 8 MG disintegrating tablet    Sig: Take 1 tablet (8 mg total) by mouth every 8 (eight) hours as needed for nausea or vomiting.    Dispense:  12 tablet    Refill:  3   nitroGLYCERIN (NITROSTAT) 0.4 MG SL tablet    Sig: Place 1 tablet (0.4 mg total) under the tongue every 5 (five) minutes as needed for chest pain (Call 911 at 3rd dose within 15 minutes.).    Dispense:  20 tablet    Refill:  3      Follow-up: Return in about 3 months (around 12/31/2022) for a follow-up visit.  Walker Kehr,  MD

## 2022-10-01 NOTE — Assessment & Plan Note (Signed)
Monitor GFR 

## 2022-10-01 NOTE — Assessment & Plan Note (Signed)
Lorazepam prn ° Potential benefits of a long term opioids use as well as potential risks (i.e. addiction risk, apnea etc) and complications (i.e. Somnolence, constipation and others) were explained to the patient and were aknowledged. ° °

## 2022-10-01 NOTE — Assessment & Plan Note (Signed)
On metoprolol and Losartan

## 2022-10-02 ENCOUNTER — Encounter: Payer: Self-pay | Admitting: Physical Medicine and Rehabilitation

## 2022-10-03 ENCOUNTER — Other Ambulatory Visit: Payer: Self-pay | Admitting: Interventional Cardiology

## 2022-10-07 ENCOUNTER — Encounter: Payer: Self-pay | Admitting: Internal Medicine

## 2022-10-09 ENCOUNTER — Ambulatory Visit: Payer: Medicare Other | Admitting: Nurse Practitioner

## 2022-11-05 ENCOUNTER — Other Ambulatory Visit: Payer: Self-pay | Admitting: Obstetrics and Gynecology

## 2022-11-05 DIAGNOSIS — Z1231 Encounter for screening mammogram for malignant neoplasm of breast: Secondary | ICD-10-CM

## 2022-11-08 ENCOUNTER — Other Ambulatory Visit: Payer: Self-pay

## 2022-11-08 ENCOUNTER — Encounter: Payer: Self-pay | Admitting: Pharmacist

## 2022-11-08 DIAGNOSIS — I4821 Permanent atrial fibrillation: Secondary | ICD-10-CM

## 2022-11-08 MED ORDER — APIXABAN 2.5 MG PO TABS: 2.5000 mg | ORAL_TABLET | Freq: Two times a day (BID) | ORAL | 2 refills | Status: DC

## 2022-11-08 NOTE — Telephone Encounter (Signed)
This encounter was created in error - please disregard.

## 2022-11-08 NOTE — Telephone Encounter (Signed)
Prescription refill request for Eliquis received. Indication: Afib  Last office visit: 05/28/22 Tamala Julian)  Scr: 1.27 (09/07/22)  Age: 88 Weight: 64.4kg  Per office visit note on - "decided upon a shared decision to lower the dose of Eliquis to 2.5 mg twice daily because of frequent falls and bleeding complications. Most recently a large right hip hematoma."  Refill sent to pharmacy.

## 2022-11-12 DIAGNOSIS — H353131 Nonexudative age-related macular degeneration, bilateral, early dry stage: Secondary | ICD-10-CM | POA: Diagnosis not present

## 2022-11-13 ENCOUNTER — Encounter: Payer: Self-pay | Admitting: Obstetrics and Gynecology

## 2022-11-13 ENCOUNTER — Ambulatory Visit (INDEPENDENT_AMBULATORY_CARE_PROVIDER_SITE_OTHER): Payer: Medicare Other | Admitting: Obstetrics and Gynecology

## 2022-11-13 VITALS — BP 118/78 | Ht 63.0 in | Wt 142.0 lb

## 2022-11-13 DIAGNOSIS — R102 Pelvic and perineal pain: Secondary | ICD-10-CM

## 2022-11-13 DIAGNOSIS — R300 Dysuria: Secondary | ICD-10-CM | POA: Diagnosis not present

## 2022-11-13 MED ORDER — AMOXICILLIN-POT CLAVULANATE 500-125 MG PO TABS: 1.0000 | ORAL_TABLET | Freq: Two times a day (BID) | ORAL | 0 refills | Status: DC

## 2022-11-13 NOTE — Patient Instructions (Signed)
Urinary Tract Infection, Adult A urinary tract infection (UTI) is an infection of any part of the urinary tract. The urinary tract includes: The kidneys. The ureters. The bladder. The urethra. These organs make, store, and get rid of pee (urine) in the body. What are the causes? This infection is caused by germs (bacteria) in your genital area. These germs grow and cause swelling (inflammation) of your urinary tract. What increases the risk? The following factors may make you more likely to develop this condition: Using a small, thin tube (catheter) to drain pee. Not being able to control when you pee or poop (incontinence). Being female. If you are female, these things can increase the risk: Using these methods to prevent pregnancy: A medicine that kills sperm (spermicide). A device that blocks sperm (diaphragm). Having low levels of a female hormone (estrogen). Being pregnant. You are more likely to develop this condition if: You have genes that add to your risk. You are sexually active. You take antibiotic medicines. You have trouble peeing because of: A prostate that is bigger than normal, if you are female. A blockage in the part of your body that drains pee from the bladder. A kidney stone. A nerve condition that affects your bladder. Not getting enough to drink. Not peeing often enough. You have other conditions, such as: Diabetes. A weak disease-fighting system (immune system). Sickle cell disease. Gout. Injury of the spine. What are the signs or symptoms? Symptoms of this condition include: Needing to pee right away. Peeing small amounts often. Pain or burning when peeing. Blood in the pee. Pee that smells bad or not like normal. Trouble peeing. Pee that is cloudy. Fluid coming from the vagina, if you are female. Pain in the belly or lower back. Other symptoms include: Vomiting. Not feeling hungry. Feeling mixed up (confused). This may be the first symptom in  older adults. Being tired and grouchy (irritable). A fever. Watery poop (diarrhea). How is this treated? Taking antibiotic medicine. Taking other medicines. Drinking enough water. In some cases, you may need to see a specialist. Follow these instructions at home:  Medicines Take over-the-counter and prescription medicines only as told by your doctor. If you were prescribed an antibiotic medicine, take it as told by your doctor. Do not stop taking it even if you start to feel better. General instructions Make sure you: Pee until your bladder is empty. Do not hold pee for a long time. Empty your bladder after sex. Wipe from front to back after peeing or pooping if you are a female. Use each tissue one time when you wipe. Drink enough fluid to keep your pee pale yellow. Keep all follow-up visits. Contact a doctor if: You do not get better after 1-2 days. Your symptoms go away and then come back. Get help right away if: You have very bad back pain. You have very bad pain in your lower belly. You have a fever. You have chills. You feeling like you will vomit or you vomit. Summary A urinary tract infection (UTI) is an infection of any part of the urinary tract. This condition is caused by germs in your genital area. There are many risk factors for a UTI. Treatment includes antibiotic medicines. Drink enough fluid to keep your pee pale yellow. This information is not intended to replace advice given to you by your health care provider. Make sure you discuss any questions you have with your health care provider. Document Revised: 04/08/2020 Document Reviewed: 04/08/2020 [employer]vier Patient Education    2023 [employer]  

## 2022-11-13 NOTE — Progress Notes (Signed)
GYNECOLOGY  VISIT   HPI: 88 y.o.   Married  Caucasian  female   G2P2001 with No LMP recorded. Patient has had a hysterectomy.   here for   UTI. Pt has noticed burning with urination and pelvic pain. Pt wants to discuss issues with vulva irritation still.   Vaginal itching and burning.  Some vaginal discharge.   No dysuria.  No blood in her urine.   No fever.   Having loose bowels and nausea.  She states she was told her IBS.  Back from [place].   GYNECOLOGIC HISTORY: No LMP recorded. Patient has had a hysterectomy. Contraception:  hysterectomy Menopausal hormone therapy:  n/a Last mammogram:  02/08/22 Breast Density Category C, BI-RADS CATEGORY 1 Neg Last pap smear:   many years ago        OB History     Gravida  2   Para  2   Term  2   Preterm      AB      Living  1      SAB      IAB      Ectopic      Multiple      Live Births  2              Patient Active Problem List   Diagnosis Date Noted   Impetigo 10/01/2022   Insomnia 10/01/2022   Osteoporosis 06/11/2022   Hematoma of right thigh 04/25/2022   Weakness 04/25/2022   Weakness generalized 04/25/2022   Falls frequently 04/19/2022   Iron deficiency anemia 12/08/2021   Skin ulcer, limited to breakdown of skin ([place]) 11/22/2021   Anemia, iron deficiency 11/22/2021   Low serum vitamin B12 11/22/2021   Acute renal failure superimposed on stage 2 chronic kidney disease ([place]) 11/20/2021   ABLA (acute blood loss anemia) 11/20/2021   Hypotension 11/19/2021   Shortness of breath 06/12/2021   Right leg pain 02/21/2021   Abnormal CT of the abdomen    Aortic atherosclerosis ([place]) 08/27/2020   Acute prerenal azotemia 08/27/2020   Shock circulatory ([place]) [id number]   Metabolic acidosis with normal anion gap and bicarbonate losses 08/27/2020   Neurogenic orthostatic hypotension ([facility]) 09/16/2019   Greater trochanteric pain syndrome of right lower extremity 07/30/2019   Bradycardia with 41-50 beats per  minute 12/27/2017   Right bundle branch block (RBBB) on electrocardiogram (ECG) 12/27/2017   Chronic kidney disease, stage 3b ([place]) 12/24/2017   Cellulitis of leg, right with large prepatella hematoma and open wounds 12/24/2017   Iliotibial band syndrome of right side 12/16/2017   Intervertebral lumbar disc disorder with myelopathy, lumbar region 04/25/2017   Family history of colon cancer in mother 01/22/2017   Primary osteoarthritis of both first carpometacarpal joints 01/02/2017   Pain 11/21/2016   Gastroenteritis 09/13/2016   Chronic anticoagulation 05/24/2015   Coronary artery disease involving native coronary artery of native heart with angina pectoris ([place]) 12/20/2014   Permanent atrial fibrillation ([place]) 11/21/2012   Greater trochanteric bursitis of both hips 05/21/2012   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   Osteoarthritis of left hip 07/03/2010   DEGENERATIVE [place] DISEASE, LUMBOSACRAL SPINE 05/18/2010   SYNCOPE 10/27/2008   Essential hypertension 06/16/2007   GERD (gastroesophageal reflux disease) 06/16/2007    Past Medical History:  Diagnosis Date   Acute blood loss anemia 12/25/2017   Allergic rhinitis    Anxiety    Arthritis    "my whole spine" (07/01/2017)   Atrial fibrillation ([place])  Bowel obstruction ([place])    in [place]   Bradycardia with 41-50 beats per minute 12/27/2017   Cancer ([place])    Cellulitis of leg, right with large prepatella hematoma and open wounds 12/26/2017   CKD (chronic kidney disease) stage 2, GFR 60-89 ml/min    Colon polyps    Coronary artery disease    10/18 PCI/DES to p/m LCx with cutting balloon to mLcx   Diverticulosis of colon    GERD (gastroesophageal reflux disease)    Hip bursitis 2010   Dr Para March, Post op seroma   History of colon polyps    HSV (herpes simplex virus) anogenital infection 07/2019   HTN (hypertension)    IBS (irritable bowel syndrome)    constipation predominant - Dr Earlean Shawl   Lichen sclerosus    Osteopenia  11/2016   T score -2.0 FRAX 15%/4.3%   PAC (premature atrial contraction)    Symptomatiic   Renal insufficiency    Right bundle branch block (RBBB) on electrocardiogram (ECG) 12/27/2017   Scoliosis    SVT (supraventricular tachycardia)    brief history   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   biopsy showing vulvar atypia, possible VIN I    Past Surgical History:  Procedure Laterality Date   ANTERIOR AND POSTERIOR VAGINAL REPAIR  01/2002   Archie Endo 01/23/2011   APPENDECTOMY  1946   CARDIAC CATHETERIZATION  06/26/2017   CORONARY ANGIOPLASTY WITH STENT PLACEMENT  07/01/2017   CORONARY ATHERECTOMY N/A 07/01/2017   Procedure: CORONARY ATHERECTOMY;  Surgeon: Belva Crome, MD;  Location: [facility];  Service: Cardiovascular;  Laterality: N/A;   CORONARY STENT INTERVENTION N/A 07/01/2017   Procedure: CORONARY STENT INTERVENTION;  Surgeon: Belva Crome, MD;  Location: [facility];  Service: Cardiovascular;  Laterality: N/A;   HAMMER TOE SURGERY     HEMORRHOID BANDING     HIP SURGERY Left 04/2009   hip examination under anesthesia followed by greater trochanteric bursectomy; iliotibial band tenotomy/notes 01/20/2011   I & D EXTREMITY Right 01/10/2018   Procedure: IRRIGATION AND DEBRIDEMENT RIGHT KNEE, APPLY WOUND VAC;  Surgeon: Newt Minion, MD;  Location: [facility];  Service: Orthopedics;  Laterality: Right;   KNEE BURSECTOMY Right 04/2009   Archie Endo 01/09/2011   LEFT ATRIAL APPENDAGE OCCLUSION N/A 08/24/2021   Procedure: LEFT ATRIAL APPENDAGE OCCLUSION;  Surgeon: Sherren Mocha, MD;  Location: [facility];  Service: Cardiovascular;  Laterality: N/A;   LEFT HEART CATH AND CORONARY ANGIOGRAPHY N/A 06/26/2017   Procedure: LEFT HEART CATH AND CORONARY ANGIOGRAPHY;  Surgeon: Belva Crome, MD;  Location: [facility];  Service: Cardiovascular;  Laterality: N/A;   PUBOVAGINAL SLING  01/2002   Archie Endo 01/23/2011   REDUCTION MAMMAPLASTY     TEE WITHOUT  CARDIOVERSION N/A 08/24/2021   Procedure: TRANSESOPHAGEAL ECHOCARDIOGRAM (TEE);  Surgeon: Sherren Mocha, MD;  Location: [facility];  Service: Cardiovascular;  Laterality: N/A;   TEMPORARY PACEMAKER N/A 07/01/2017   Procedure: TEMPORARY PACEMAKER;  Surgeon: Belva Crome, MD;  Location: [facility];  Service: Cardiovascular;  Laterality: N/A;   VAGINAL HYSTERECTOMY  01/2002   Vaginal hysterectomy, bilateral salpingo-oophorectomy/notes 01/23/2011    Current Outpatient Medications  Medication Sig Dispense Refill   acetaminophen (TYLENOL) 500 MG tablet Take 1,000 mg by mouth every 6 (six) hours as needed (body aches / sore hip). Maximum of 6 tablets daily ('3000mg'$ )     apixaban (ELIQUIS) 2.5 MG TABS tablet Take 1 tablet (2.5  mg total) by mouth 2 (two) times daily. 200 tablet 2   atorvastatin (LIPITOR) 40 MG tablet Take 1 tablet (40 mg total) by mouth every Monday, Wednesday, and Friday. 45 tablet 2   BIOTIN PO Take 5,000 mcg by mouth in the morning.     Cholecalciferol (VITAMIN D3) 50 MCG (2000 UT) capsule Take 1 capsule (2,000 Units total) by mouth daily. 100 capsule 3   Cyanocobalamin (VITAMIN B-12) 1000 MCG SUBL Place 1 tablet (1,000 mcg total) under the tongue daily. 100 tablet 3   diclofenac Sodium (VOLTAREN) 1 % GEL Apply 2 g topically 4 (four) times daily as needed (back pain.).     dicyclomine (BENTYL) 20 MG tablet Take 1 by mouth every 4-6 hours as needed for cramping 30 tablet 3   DULoxetine (CYMBALTA) 20 MG capsule Take 40 mg x 1 week, then 20 mg/day 90 capsule 3   empagliflozin (JARDIANCE) 10 MG TABS tablet Take 1 tablet (10 mg total) by mouth daily before breakfast. 30 tablet 9   esomeprazole (NEXIUM) 40 MG capsule TAKE ONE CAPSULE ONCE DAILY. 90 capsule 1   Homeopathic Products (ARNICARE) GEL Apply 1 application topically daily as needed (skin bruising).     hyoscyamine (LEVSIN) 0.125 MG tablet TAKE 1-2 TABLETS (0.125-0.25 MG) BY MOUTH EVERY 4 HOURS AS NEEDED FOR UP TO  10 DAYS FOR CRAMPING. (Patient taking differently: Take 0.125-0.25 mg by mouth every 4 (four) hours as needed for cramping.) 100 tablet 1   LORazepam (ATIVAN) 1 MG tablet TAKE ONE TABLET BY MOUTH TWICE DAILY AS NEEDED FOR ANXIETY / SLEEP. 180 tablet 1   losartan (COZAAR) 25 MG tablet Take 0.5 tablets (12.5 mg total) by mouth daily. 45 tablet 3   meclizine (ANTIVERT) 12.5 MG tablet Take 1 tablet (12.5 mg total) by mouth 3 (three) times daily as needed for dizziness. 60 tablet 1   metoprolol succinate (TOPROL-XL) 25 MG 24 hr tablet Take 0.5 tablets (12.5 mg total) by mouth daily. 45 tablet 3   nitroGLYCERIN (NITROSTAT) 0.4 MG SL tablet Place 1 tablet (0.4 mg total) under the tongue every 5 (five) minutes as needed for chest pain (Call 911 at 3rd dose within 15 minutes.). 20 tablet 3   ondansetron (ZOFRAN-ODT) 8 MG disintegrating tablet Take 1 tablet (8 mg total) by mouth every 8 (eight) hours as needed for nausea or vomiting. 12 tablet 3   Polyethyl Glycol-Propyl Glycol (LUBRICANT EYE DROPS) 0.4-0.3 % SOLN Place 1-2 drops into both eyes at bedtime.     sodium chloride (OCEAN) 0.65 % SOLN nasal spray Place 1 spray into both nostrils at bedtime as needed for congestion.     No current facility-administered medications for this visit.     ALLERGIES: Macrobid [nitrofurantoin monohyd macro], Meloxicam, Digoxin and related, Diprolene [betamethasone dipropionate aug], Ferrous sulfate, Keflex [cephalexin], Mobic [meloxicam], and Sulfamethoxazole-trimethoprim  Family History  Problem Relation Age of Onset   Colon cancer Mother        Dx age 62, died at age 6   Diabetes Father    Prostate cancer Father    Prostate cancer Brother    Pancreatic cancer Brother    Stomach cancer Son     Social History   Socioeconomic History   Marital status: Married    Spouse name: [name]   Number of children: 2   Years of education: Not on file   Highest education level: Not on file  Occupational History    Occupation: Patent attorney:  RETIRED  Tobacco Use   Smoking status: Former    Packs/day: 0.25    Years: 28.00    Total pack years: 7.00    Types: Cigarettes    Quit date: 1981    Years since quitting: 43.2   Smokeless tobacco: Never  Vaping Use   Vaping Use: Never used  Substance and Sexual Activity   Alcohol use: Yes    Comment: 7 vodka drinks a week   Drug use: Never   Sexual activity: Not Currently    Birth control/protection: Surgical    Comment: hysterectomy  Other Topics Concern   Not on file  Social History Narrative   Regular Exercise -  YES         Social Determinants of Health   Financial Resource Strain: Low Risk  (02/14/2022)   Overall Financial Resource Strain (CARDIA)    Difficulty of Paying Living Expenses: Not hard at all  Food Insecurity: No Food Insecurity (02/14/2022)   Hunger Vital Sign    Worried About Running Out of Food in the Last Year: Never true    Ran Out of Food in the Last Year: Never true  Transportation Needs: No Transportation Needs (02/14/2022)   PRAPARE - Hydrologist (Medical): No    Lack of Transportation (Non-Medical): No  Physical Activity: Inactive (02/14/2022)   Exercise Vital Sign    Days of Exercise per Week: 0 days    Minutes of Exercise per Session: 0 min  Stress: No Stress Concern Present (02/14/2022)   [place]    Feeling of Stress : Only a little  Social Connections: Moderately Integrated (02/14/2022)   Social Connection and Isolation Panel [NHANES]    Frequency of Communication with Friends and Family: More than three times a week    Frequency of Social Gatherings with Friends and Family: Three times a week    Attends Religious Services: More than 4 times per year    Active Member of Clubs or Organizations: No    Attends Archivist Meetings: Never    Marital Status: Married  Human resources officer Violence: Not At Risk  (02/14/2022)   Humiliation, Afraid, Rape, and Kick questionnaire    Fear of Current or Ex-Partner: No    Emotionally Abused: No    Physically Abused: No    Sexually Abused: No    Review of Systems  Genitourinary:  Positive for dysuria and pelvic pain.  All other systems reviewed and are negative.   PHYSICAL EXAMINATION:    BP 118/78 (BP Location: Left Arm, Patient Position: Sitting, Cuff Size: Normal)   Ht '5\' 3"'$  (1.6 m)   Wt 142 lb (64.4 kg)   BMI 25.15 kg/m     General appearance: alert, cooperative and appears stated age   Pelvic: External genitalia:  left labia minora with erythema and a small amount of white skin change. (Old standing)              Urethra:  normal appearing urethra with no masses, tenderness or lesions              Bartholins and Skenes: normal                 Vagina: normal appearing vagina with normal color and discharge, no lesions              Cervix: no lesions  Bimanual Exam:  Uterus:  normal size, contour, position, consistency, mobility, non-tender              Adnexa: no mass, fullness, tenderness             Chaperone was present for exam:  Raquel Sarna  ASSESSMENT  Urinary tract infection.  Multiple abx allergies/intolerances. Status post TVH/BSO. Chronic vulvitis.    Lichen sclerosus.   Appears stable.  VIN I.    PLAN  Urinalysis: sg 1.015, pH 5.0, positive nitrites, 0 - 5 WBC, no RBC, many bacteria.  Reflex culture sent. Augmentin 500/125 mg po bid x 5 days.  Call if no improvement in 2 days.   An After Visit Summary was printed and given to the patient.

## 2022-11-16 LAB — URINALYSIS, COMPLETE W/RFL CULTURE
Bilirubin Urine: NEGATIVE
Hgb urine dipstick: NEGATIVE
Hyaline Cast: NONE SEEN /LPF
Ketones, ur: NEGATIVE
Leukocyte Esterase: NEGATIVE
Nitrites, Initial: POSITIVE — AB
Protein, ur: NEGATIVE
RBC / HPF: NONE SEEN /HPF (ref 0–2)
Specific Gravity, Urine: 1.015 (ref 1.001–1.035)
pH: 5 (ref 5.0–8.0)

## 2022-11-16 LAB — URINE CULTURE
MICRO NUMBER:: 14650886
SPECIMEN QUALITY:: ADEQUATE

## 2022-11-16 LAB — CULTURE INDICATED

## 2022-11-21 NOTE — Progress Notes (Signed)
Cardiology Office Note:    Date:  12/03/2022   ID:  Hailey [name], DOB 10/05/1934, MRN [id number]  PCP:  Cassandria Anger, MD   [facility] Cardiologist:  Sinclair Grooms, MD (Inactive) Electrophysiologist:  Vickie Epley, MD {  Referring MD: Cassandria Anger, MD    History of Present Illness:    Hailey [name] is a 88 y.o. female with a hx of HTN, permanent Afib, CAD s/p DES to RCA in 2018, and aortic atherosclerosis who presents to clinic for follow-up. Previously followed with Dr. Tamala Julian.  Was seen by Dr. Quentin Ore for [place] consideration. During the procedure, she was noted to have an anteriorly directed, acutely angled, chicken wing LAA and an occlusion device was not able to be successfully deployed. She was therefore maintained on AC. Unfortunately, she had suffered falls with bleeding on apixaban. Her dose was lowered during visit with Dr. Tamala Julian on 05/2022.   Today, the patient overall feels well. No chest pain, SOB, orthopnea, PND, or LE edema. Exercises with a trainor and does well. No significant bleeding on the apixaban on the reduced dose. No recent falls. Blood pressure well controlled. No orthostatic symptoms.   Past Medical History:  Diagnosis Date   Acute blood loss anemia 12/25/2017   Allergic rhinitis    Anxiety    Arthritis    "my whole spine" (07/01/2017)   Atrial fibrillation (HCC)    Bowel obstruction (HCC)    in [place]   Bradycardia with 41-50 beats per minute 12/27/2017   Cancer ([place])    Cellulitis of leg, right with large prepatella hematoma and open wounds 12/26/2017   CKD (chronic kidney disease) stage 2, GFR 60-89 ml/min    Colon polyps    Coronary artery disease    10/18 PCI/DES to p/m LCx with cutting balloon to mLcx   Diverticulosis of colon    GERD (gastroesophageal reflux disease)    Hip bursitis 2010   Dr Para March, Post op seroma   History of colon polyps    HSV (herpes simplex virus) anogenital infection  07/2019   HTN (hypertension)    IBS (irritable bowel syndrome)    constipation predominant - Dr Earlean Shawl   Lichen sclerosus    Osteopenia 11/2016   T score -2.0 FRAX 15%/4.3%   PAC (premature atrial contraction)    Symptomatiic   Renal insufficiency    Right bundle branch block (RBBB) on electrocardiogram (ECG) 12/27/2017   Scoliosis    SVT (supraventricular tachycardia)    brief history   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   biopsy showing vulvar atypia, possible VIN I    Past Surgical History:  Procedure Laterality Date   ANTERIOR AND POSTERIOR VAGINAL REPAIR  01/2002   Archie Endo 01/23/2011   APPENDECTOMY  1948   CARDIAC CATHETERIZATION  06/26/2017   CORONARY ANGIOPLASTY WITH STENT PLACEMENT  07/01/2017   CORONARY ATHERECTOMY N/A 07/01/2017   Procedure: CORONARY ATHERECTOMY;  Surgeon: Belva Crome, MD;  Location: [facility];  Service: Cardiovascular;  Laterality: N/A;   CORONARY STENT INTERVENTION N/A 07/01/2017   Procedure: CORONARY STENT INTERVENTION;  Surgeon: Belva Crome, MD;  Location: [facility];  Service: Cardiovascular;  Laterality: N/A;   HAMMER TOE SURGERY     HEMORRHOID BANDING     HIP SURGERY Left 04/2009   hip examination under anesthesia followed by greater trochanteric bursectomy; iliotibial band tenotomy/notes 01/20/2011   I & D EXTREMITY Right 01/10/2018  Procedure: IRRIGATION AND DEBRIDEMENT RIGHT KNEE, APPLY WOUND VAC;  Surgeon: Newt Minion, MD;  Location: [facility];  Service: Orthopedics;  Laterality: Right;   KNEE BURSECTOMY Right 04/2009   Archie Endo 01/09/2011   LEFT ATRIAL APPENDAGE OCCLUSION N/A 08/24/2021   Procedure: LEFT ATRIAL APPENDAGE OCCLUSION;  Surgeon: Sherren Mocha, MD;  Location: [facility];  Service: Cardiovascular;  Laterality: N/A;   LEFT HEART CATH AND CORONARY ANGIOGRAPHY N/A 06/26/2017   Procedure: LEFT HEART CATH AND CORONARY ANGIOGRAPHY;  Surgeon: Belva Crome, MD;  Location: [facility];   Service: Cardiovascular;  Laterality: N/A;   PUBOVAGINAL SLING  01/2002   Archie Endo 01/23/2011   REDUCTION MAMMAPLASTY     TEE WITHOUT CARDIOVERSION N/A 08/24/2021   Procedure: TRANSESOPHAGEAL ECHOCARDIOGRAM (TEE);  Surgeon: Sherren Mocha, MD;  Location: [facility];  Service: Cardiovascular;  Laterality: N/A;   TEMPORARY PACEMAKER N/A 07/01/2017   Procedure: TEMPORARY PACEMAKER;  Surgeon: Belva Crome, MD;  Location: [facility];  Service: Cardiovascular;  Laterality: N/A;   VAGINAL HYSTERECTOMY  01/2002   Vaginal hysterectomy, bilateral salpingo-oophorectomy/notes 01/23/2011    Current Medications: Current Meds  Medication Sig   acetaminophen (TYLENOL) 500 MG tablet Take 1,000 mg by mouth every 6 (six) hours as needed (body aches / sore hip). Maximum of 6 tablets daily (3000mg )   apixaban (ELIQUIS) 2.5 MG TABS tablet Take 1 tablet (2.5 mg total) by mouth 2 (two) times daily.   BIOTIN PO Take 5,000 mcg by mouth in the morning.   Cholecalciferol (VITAMIN D3) 50 MCG (2000 UT) capsule Take 1 capsule (2,000 Units total) by mouth daily.   Cyanocobalamin (VITAMIN B-12) 1000 MCG SUBL Place 1 tablet (1,000 mcg total) under the tongue daily.   diclofenac Sodium (VOLTAREN) 1 % GEL Apply 2 g topically 4 (four) times daily as needed (back pain.).   dicyclomine (BENTYL) 20 MG tablet Take 1 by mouth every 4-6 hours as needed for cramping (Patient taking differently: Take 20 mg by mouth 4 (four) times daily -  before meals and at bedtime. Take 1 by mouth every 4-6 hours as needed for cramping)   empagliflozin (JARDIANCE) 10 MG TABS tablet Take 1 tablet (10 mg total) by mouth daily before breakfast.   escitalopram (LEXAPRO) 5 MG tablet Take 1 tablet (5 mg total) by mouth at bedtime.   esomeprazole (NEXIUM) 40 MG capsule TAKE ONE CAPSULE ONCE DAILY.   Homeopathic Products (ARNICARE) GEL Apply 1 application topically daily as needed (skin bruising).   hyoscyamine (LEVSIN) 0.125 MG tablet TAKE  1-2 TABLETS (0.125-0.25 MG) BY MOUTH EVERY 4 HOURS AS NEEDED FOR UP TO 10 DAYS FOR CRAMPING. (Patient taking differently: Take 0.125-0.25 mg by mouth every 4 (four) hours as needed for cramping.)   LORazepam (ATIVAN) 1 MG tablet TAKE ONE TABLET BY MOUTH TWICE DAILY AS NEEDED FOR ANXIETY / SLEEP.   losartan (COZAAR) 25 MG tablet Take 0.5 tablets (12.5 mg total) by mouth daily.   meclizine (ANTIVERT) 12.5 MG tablet Take 1 tablet (12.5 mg total) by mouth 3 (three) times daily as needed for dizziness.   metoprolol succinate (TOPROL-XL) 25 MG 24 hr tablet Take 0.5 tablets (12.5 mg total) by mouth daily.   nitroGLYCERIN (NITROSTAT) 0.4 MG SL tablet Place 1 tablet (0.4 mg total) under the tongue every 5 (five) minutes as needed for chest pain (Call 911 at 3rd dose within 15 minutes.).   ondansetron (ZOFRAN-ODT) 8 MG disintegrating tablet Take 1 tablet (8 mg  total) by mouth every 8 (eight) hours as needed for nausea or vomiting.   Polyethyl Glycol-Propyl Glycol (LUBRICANT EYE DROPS) 0.4-0.3 % SOLN Place 1-2 drops into both eyes at bedtime.   sodium chloride (OCEAN) 0.65 % SOLN nasal spray Place 1 spray into both nostrils at bedtime as needed for congestion.     Allergies:   Macrobid [nitrofurantoin monohyd macro], Meloxicam, Digoxin and related, Diprolene [betamethasone dipropionate aug], Ferrous sulfate, Keflex [cephalexin], Mobic [meloxicam], and Sulfamethoxazole-trimethoprim   Social History   Socioeconomic History   Marital status: Married    Spouse name: [name]   Number of children: 2   Years of education: Not on file   Highest education level: Not on file  Occupational History   Occupation: Patent attorney: RETIRED  Tobacco Use   Smoking status: Former    Packs/day: 0.25    Years: 28.00    Additional pack years: 0.00    Total pack years: 7.00    Types: Cigarettes    Quit date: 1981    Years since quitting: 43.2   Smokeless tobacco: Never  Vaping Use   Vaping Use: Never used   Substance and Sexual Activity   Alcohol use: Yes    Comment: 7 vodka drinks a week   Drug use: Never   Sexual activity: Not Currently    Birth control/protection: Surgical    Comment: hysterectomy  Other Topics Concern   Not on file  Social History Narrative   Regular Exercise -  YES         Social Determinants of Health   Financial Resource Strain: Low Risk  (02/14/2022)   Overall Financial Resource Strain (CARDIA)    Difficulty of Paying Living Expenses: Not hard at all  Food Insecurity: No Food Insecurity (02/14/2022)   Hunger Vital Sign    Worried About Running Out of Food in the Last Year: Never true    Ran Out of Food in the Last Year: Never true  Transportation Needs: No Transportation Needs (02/14/2022)   PRAPARE - Hydrologist (Medical): No    Lack of Transportation (Non-Medical): No  Physical Activity: Inactive (02/14/2022)   Exercise Vital Sign    Days of Exercise per Week: 0 days    Minutes of Exercise per Session: 0 min  Stress: No Stress Concern Present (02/14/2022)   [place]    Feeling of Stress : Only a little  Social Connections: Moderately Integrated (02/14/2022)   Social Connection and Isolation Panel [NHANES]    Frequency of Communication with Friends and Family: More than three times a week    Frequency of Social Gatherings with Friends and Family: Three times a week    Attends Religious Services: More than 4 times per year    Active Member of Clubs or Organizations: No    Attends Archivist Meetings: Never    Marital Status: Married     Family History: The patient's family history includes Colon cancer in her mother; Diabetes in her father; Pancreatic cancer in her brother; Prostate cancer in her brother and father; Stomach cancer in her son.  ROS:   Please see the history of present illness.     All other systems reviewed and are  negative.  EKGs/Labs/Other Studies Reviewed:    The following studies were reviewed today: TEE August 24, 2021: IMPRESSIONS     1. Left ventricular ejection fraction, by estimation, is 60 to 65%.  The  left ventricle has normal function. The left ventricle has no regional  wall motion abnormalities. Left ventricular diastolic function could not  be evaluated.   2. Right ventricular systolic function is normal. The right ventricular  size is normal. There is normal pulmonary artery systolic pressure.   3. Left atrial size was severely dilated. No left atrial/left atrial  appendage thrombus was detected.   4. A small pericardial effusion is present. The pericardial effusion is  localized near the right ventricle.   5. The mitral valve is normal in structure. Mild mitral valve  regurgitation.   6. The aortic valve is tricuspid. Aortic valve regurgitation is not  visualized.   7. There is mild dilatation of the ascending aorta, measuring 40 mm.   8. Very small iatrogenic atrial septal defect with exclusive  left-to-right shunt is seen following transseptal puncture. There is a  small secundum atrial septal defect with predominantly left to right  shunting across the atrial septum.   CTA 12 10/2020: FINDINGS: A 120 kV prospective scan was triggered in the descending thoracic aorta at 111 HU's. Gantry rotation speed was 280 msecs and collimation was .9 mm. No beta blockade and no NTG was given. The 3D data set was reconstructed in 5% intervals of the 60-80 % of the R-R cycle. Systolic and diastolic phases were analyzed on a dedicated work station using MPR, MIP and VRT modes. The patient received 100 cc of contrast.   Pulmonary Veins:   There is normal pulmonary vein drainage into the left atrium (2 on the right and 2 on the left) with ostial measurements as follows:   RUPV: 25 mm x 20 mm   RLPV: 26 mm x 22 mm   LUPV: 23 mm x 17 mm   LLPV: 22 mm x 20 mm   Left atrium and  appendage:   Left atrium: Dilated left atrium. Small atrial diverticulum noted. There is no thrombus in the left atrial appendage.   Left atrial appendage: The left atrial appendage is 32 mm X 26 mm   Appendage morphology: Chicken Wing with two lobes   Pre-procedural Measurements   Best phase: 40%   Landing Zone Measurements:   Left atrial appendage landing zone size 30 mm X 23 mm, average diameter 26 mm.   Maximal length from the landing zone of main lobe is 30 mm.   Notes on transseptal puncture:   No evidence of PFO or ASD.   Inferior-posterior stick advised.   Optimal Deployment Angle: RAO 33 CRA 24   Catheter Selection: Double Curve Catheter   Comments on device selection:   Using maximal diameter size, a 35 mm device would allow 14% compression.   Using average diameter size, a 35 mm device would allow 25% compression, and a 31 mm device would allow a 16% compression.   Esophagus the esophagus runs in the left atrial midline.   Aorta: Thoracic aortic aneurysm, moderate, 45 mm, at [id number] diastolic phase.   Main pulmonary artery: Mild dilation 30 mm.   Aortic Valve: Tri-leaflet.  Aortic valve calcium score 101.   Coronary Arteries: Calcified coronaries and prior RCA PCI noted- study not optimized to evaluation for patency.   Calcium scoring deferred.   Extra-cardiac findings: See attached radiology report for non-cardiac structures.   IMPRESSION: 1. There is normal pulmonary vein drainage into the left atrium.   2. The left atrial appendage is patent.   3. Watchman Flx measurements as above.   4.  Moderate thoracic aortic aneurysm.  EKG:  EKG is not ordered today.    Recent Labs: 04/27/2022: Magnesium 2.0 09/07/2022: ALT 14; BUN 33; Creatinine, Ser 1.27; Hemoglobin 13.5; Platelets 236.0; Potassium 4.7; Sodium 134  Recent Lipid Panel    Component Value Date/Time   CHOL 167 01/29/2022 1006   TRIG 113 01/29/2022 1006   TRIG 183 (H) 07/26/2006  0903   HDL 109 01/29/2022 1006   CHOLHDL 1.5 01/29/2022 1006   CHOLHDL 2.2 07/04/2014 0528   VLDL 19 07/04/2014 0528   LDLCALC 39 01/29/2022 1006   LDLDIRECT 89.1 06/29/2013 1604     Risk Assessment/Calculations:    CHA2DS2-VASc Score = 5   This indicates a 7.2% annual risk of stroke. The patient's score is based upon: CHF History: 0 HTN History: 1 Diabetes History: 0 Stroke History: 0 Vascular Disease History: 1 Age Score: 2 Gender Score: 1            Physical Exam:    VS:  BP 122/80   Pulse 68   Ht 5\' 3"  (1.6 m)   Wt 143 lb 12.8 oz (65.2 kg)   SpO2 95%   BMI 25.47 kg/m     Wt Readings from Last 3 Encounters:  12/03/22 143 lb 12.8 oz (65.2 kg)  11/27/22 143 lb (64.9 kg)  11/13/22 142 lb (64.4 kg)     GEN:  Well nourished, well developed in no acute distress HEENT: Normal NECK: No JVD; No carotid bruits CARDIAC: Irregularly irregular, 2/6 systolic murmur RESPIRATORY:  Clear to auscultation without rales, wheezing or rhonchi  ABDOMEN: Soft, non-tender, non-distended MUSCULOSKELETAL:  No edema; No deformity  SKIN: Warm and dry NEUROLOGIC:  Alert and oriented x 3 PSYCHIATRIC:  Normal affect   ASSESSMENT:    1. Coronary artery disease involving native coronary artery of native heart without angina pectoris   2. Permanent atrial fibrillation ([place])   3. Chronic anticoagulation   4. Aneurysm of ascending aorta without rupture ([place])   5. Essential hypertension   6. Hyperlipidemia LDL goal <70   7. Primary hypertension    PLAN:    In order of problems listed above:  #CAD s/p RCA PCI in 2018: -Doing well without anginal symptoms -Not on ASA due to need for apixaban -Off lipitor due to abdominal cramps; declined starting new statin today  #Permanent Afib: -Failed LAAO due to unsuitable anatomy for watchman device -Has had multiple falls with bleeding and apixaban dose has been lowered to 2.5mg  BID -Continue metop 12.5mg  XL daily  #HTN: -Continue  losartan 12.5mg  daily -Continue metop 12.5mg  XL daily -Tolerating higher BP due to history of orthostasis  #HLD: -Off lipitor due to stomach cramps; declined starting new statin today  #Moderate ascending aortic aneurysm: -Measured 38mm on CT 08/2021 -Check TTE in 08/2023 for monitoring  -Continue metoprolol 12.5mg  XL daily -Continue losartan 12.5mg  daily  #Chronic Diastolic HF: -TTE 99991111 with EF 65-70%, G2DD, moderate RV dysfunction, moderate TR, mild MR -Euvolemic and compensated on exam with NYHA class I-II symptoms -Not on diuretics -Continue jardiance 10mg  daily  #Moderate TR: #Mild MR: -Check TTE for monitoring     Medication Adjustments/Labs and Tests Ordered: Current medicines are reviewed at length with the patient today.  Concerns regarding medicines are outlined above.  Orders Placed This Encounter  Procedures   ECHOCARDIOGRAM COMPLETE   No orders of the defined types were placed in this encounter.   Patient Instructions  Medication Instructions:   Your physician recommends that you continue on  your current medications as directed. Please refer to the Current Medication list given to you today.  *If you need a refill on your cardiac medications before your next appointment, please call your pharmacy*    Testing/Procedures:  Your physician has requested that you have an echocardiogram. Echocardiography is a painless test that uses sound waves to create images of your heart. It provides your doctor with information about the size and shape of your heart and how well your heart's chambers and valves are working. This procedure takes approximately one hour. There are no restrictions for this procedure.  SCHEDULE ECHO TO BE DONE IN DECEMBER 2024 PER DR. Johney Frame  Please do NOT wear cologne, perfume, aftershave, or lotions (deodorant is allowed). Please arrive 15 minutes prior to your appointment time.    Follow-Up: At [facility], you and your  health needs are our priority.  As part of our continuing mission to provide you with exceptional heart care, we have created designated Provider Care Teams.  These Care Teams include your primary Cardiologist (physician) and Advanced Practice Providers (APPs -  Physician Assistants and Nurse Practitioners) who all work together to provide you with the care you need, when you need it.  We recommend signing up for the patient portal called "MyChart".  Sign up information is provided on this After Visit Summary.  MyChart is used to connect with patients for Virtual Visits (Telemedicine).  Patients are able to view lab/test results, encounter notes, upcoming appointments, etc.  Non-urgent messages can be sent to your provider as well.   To learn more about what you can do with MyChart, go to [email or web address].    Your next appointment:   6 month(s)  Provider:   DR. Johney Frame      Signed, Freada Bergeron, MD  12/03/2022 11:21 AM    [place]

## 2022-11-22 ENCOUNTER — Encounter: Payer: Self-pay | Admitting: Internal Medicine

## 2022-11-27 ENCOUNTER — Encounter: Payer: Self-pay | Admitting: Internal Medicine

## 2022-11-27 ENCOUNTER — Other Ambulatory Visit: Payer: Medicare Other

## 2022-11-27 ENCOUNTER — Ambulatory Visit: Payer: Medicare Other | Admitting: Hematology

## 2022-11-27 ENCOUNTER — Ambulatory Visit (INDEPENDENT_AMBULATORY_CARE_PROVIDER_SITE_OTHER): Payer: Medicare Other | Admitting: Internal Medicine

## 2022-11-27 VITALS — BP 118/78 | HR 59 | Temp 97.9°F | Ht 63.0 in | Wt 143.0 lb

## 2022-11-27 DIAGNOSIS — R531 Weakness: Secondary | ICD-10-CM | POA: Diagnosis not present

## 2022-11-27 DIAGNOSIS — I10 Essential (primary) hypertension: Secondary | ICD-10-CM

## 2022-11-27 DIAGNOSIS — G4700 Insomnia, unspecified: Secondary | ICD-10-CM

## 2022-11-27 DIAGNOSIS — R109 Unspecified abdominal pain: Secondary | ICD-10-CM

## 2022-11-27 DIAGNOSIS — K219 Gastro-esophageal reflux disease without esophagitis: Secondary | ICD-10-CM

## 2022-11-27 LAB — URINALYSIS
Bilirubin Urine: NEGATIVE
Hgb urine dipstick: NEGATIVE
Ketones, ur: NEGATIVE
Leukocytes,Ua: NEGATIVE
Nitrite: NEGATIVE
Specific Gravity, Urine: 1.015 (ref 1.000–1.030)
Total Protein, Urine: NEGATIVE
Urine Glucose: >=1000 — AB
Urobilinogen, UA: 0.2 (ref 0.0–1.0)
pH: 5.5 (ref 5.0–8.0)

## 2022-11-27 MED ORDER — ESCITALOPRAM OXALATE 5 MG PO TABS: 5.0000 mg | ORAL_TABLET | Freq: Every day | ORAL | 1 refills | Status: DC

## 2022-11-27 NOTE — Progress Notes (Signed)
Subjective:  Patient ID: Hailey [name], female    DOB: [birth year]  Age: 88 y.o. MRN: [id number]  CC: Follow-up (Consistent Stomach cramps, Nausea )   HPI CAMILY FREID presents for insomnia, depression because, LBP On Cymbalta 20 mg/d C/o lower abd cramps  Outpatient Medications Prior to Visit  Medication Sig Dispense Refill   acetaminophen (TYLENOL) 500 MG tablet Take 1,000 mg by mouth every 6 (six) hours as needed (body aches / sore hip). Maximum of 6 tablets daily (3000mg )     apixaban (ELIQUIS) 2.5 MG TABS tablet Take 1 tablet (2.5 mg total) by mouth 2 (two) times daily. 200 tablet 2   atorvastatin (LIPITOR) 40 MG tablet Take 1 tablet (40 mg total) by mouth every Monday, Wednesday, and Friday. 45 tablet 2   BIOTIN PO Take 5,000 mcg by mouth in the morning.     Cholecalciferol (VITAMIN D3) 50 MCG (2000 UT) capsule Take 1 capsule (2,000 Units total) by mouth daily. 100 capsule 3   Cyanocobalamin (VITAMIN B-12) 1000 MCG SUBL Place 1 tablet (1,000 mcg total) under the tongue daily. 100 tablet 3   diclofenac Sodium (VOLTAREN) 1 % GEL Apply 2 g topically 4 (four) times daily as needed (back pain.).     dicyclomine (BENTYL) 20 MG tablet Take 1 by mouth every 4-6 hours as needed for cramping 30 tablet 3   empagliflozin (JARDIANCE) 10 MG TABS tablet Take 1 tablet (10 mg total) by mouth daily before breakfast. 30 tablet 9   esomeprazole (NEXIUM) 40 MG capsule TAKE ONE CAPSULE ONCE DAILY. 90 capsule 1   Homeopathic Products (ARNICARE) GEL Apply 1 application topically daily as needed (skin bruising).     hyoscyamine (LEVSIN) 0.125 MG tablet TAKE 1-2 TABLETS (0.125-0.25 MG) BY MOUTH EVERY 4 HOURS AS NEEDED FOR UP TO 10 DAYS FOR CRAMPING. (Patient taking differently: Take 0.125-0.25 mg by mouth every 4 (four) hours as needed for cramping.) 100 tablet 1   LORazepam (ATIVAN) 1 MG tablet TAKE ONE TABLET BY MOUTH TWICE DAILY AS NEEDED FOR ANXIETY / SLEEP. 180 tablet 1   losartan (COZAAR) 25 MG  tablet Take 0.5 tablets (12.5 mg total) by mouth daily. 45 tablet 3   meclizine (ANTIVERT) 12.5 MG tablet Take 1 tablet (12.5 mg total) by mouth 3 (three) times daily as needed for dizziness. 60 tablet 1   metoprolol succinate (TOPROL-XL) 25 MG 24 hr tablet Take 0.5 tablets (12.5 mg total) by mouth daily. 45 tablet 3   nitroGLYCERIN (NITROSTAT) 0.4 MG SL tablet Place 1 tablet (0.4 mg total) under the tongue every 5 (five) minutes as needed for chest pain (Call 911 at 3rd dose within 15 minutes.). 20 tablet 3   ondansetron (ZOFRAN-ODT) 8 MG disintegrating tablet Take 1 tablet (8 mg total) by mouth every 8 (eight) hours as needed for nausea or vomiting. 12 tablet 3   Polyethyl Glycol-Propyl Glycol (LUBRICANT EYE DROPS) 0.4-0.3 % SOLN Place 1-2 drops into both eyes at bedtime.     sodium chloride (OCEAN) 0.65 % SOLN nasal spray Place 1 spray into both nostrils at bedtime as needed for congestion.     amoxicillin-clavulanate (AUGMENTIN) 500-125 MG tablet Take 1 tablet by mouth in the morning and at bedtime. Take twice a day for 5 days. 10 tablet 0   DULoxetine (CYMBALTA) 20 MG capsule Take 40 mg x 1 week, then 20 mg/day 90 capsule 3   No facility-administered medications prior to visit.    ROS: Review of Systems  Constitutional:  Positive for fatigue. Negative for activity change, appetite change, chills and unexpected weight change.  HENT:  Negative for congestion, mouth sores and sinus pressure.   Eyes:  Negative for visual disturbance.  Respiratory:  Negative for cough and chest tightness.   Gastrointestinal:  Negative for abdominal pain and nausea.  Genitourinary:  Negative for difficulty urinating, frequency and vaginal pain.  Musculoskeletal:  Negative for back pain and gait problem.  Skin:  Negative for pallor and rash.  Neurological:  Negative for dizziness, tremors, weakness, numbness and headaches.  Psychiatric/Behavioral:  Positive for dysphoric mood. Negative for confusion and sleep  disturbance. The patient is nervous/anxious.     Objective:  BP 118/78 (BP Location: Left Arm, Patient Position: Sitting, Cuff Size: Large)   Pulse (!) 59   Temp 97.9 F (36.6 C) (Oral)   Ht 5\' 3"  (1.6 m)   Wt 143 lb (64.9 kg)   SpO2 90%   BMI 25.33 kg/m   BP Readings from Last 3 Encounters:  11/27/22 118/78  11/13/22 118/78  10/01/22 122/80    Wt Readings from Last 3 Encounters:  11/27/22 143 lb (64.9 kg)  11/13/22 142 lb (64.4 kg)  10/01/22 142 lb (64.4 kg)    Physical Exam Constitutional:      General: She is not in acute distress.    Appearance: She is well-developed. She is obese.  HENT:     Head: Normocephalic.     Right Ear: External ear normal.     Left Ear: External ear normal.     Nose: Nose normal.  Eyes:     General:        Right eye: No discharge.        Left eye: No discharge.     Conjunctiva/sclera: Conjunctivae normal.     Pupils: Pupils are equal, round, and reactive to light.  Neck:     Thyroid: No thyromegaly.     Vascular: No JVD.     Trachea: No tracheal deviation.  Cardiovascular:     Rate and Rhythm: Normal rate and regular rhythm.     Heart sounds: Normal heart sounds.  Pulmonary:     Effort: No respiratory distress.     Breath sounds: No stridor. No wheezing.  Abdominal:     General: Bowel sounds are normal. There is no distension.     Palpations: Abdomen is soft. There is no mass.     Tenderness: There is no abdominal tenderness. There is no guarding or rebound.  Musculoskeletal:        General: No tenderness.     Cervical back: Normal range of motion and neck supple. No rigidity.  Lymphadenopathy:     Cervical: No cervical adenopathy.  Skin:    Findings: No erythema or rash.  Neurological:     Mental Status: She is oriented to person, place, and time.     Cranial Nerves: No cranial nerve deficit.     Motor: No abnormal muscle tone.     Coordination: Coordination normal.     Gait: Gait abnormal.     Deep Tendon Reflexes:  Reflexes normal.  Psychiatric:        Behavior: Behavior normal.        Thought Content: Thought content normal.        Judgment: Judgment normal.     Lab Results  Component Value Date   WBC 6.4 09/07/2022   HGB 13.5 09/07/2022   HCT 40.6 09/07/2022   PLT 236.0 09/07/2022  GLUCOSE 87 09/07/2022   CHOL 167 01/29/2022   TRIG 113 01/29/2022   HDL 109 01/29/2022   LDLDIRECT 89.1 06/29/2013   LDLCALC 39 01/29/2022   ALT 14 09/07/2022   AST 24 09/07/2022   NA 134 (L) 09/07/2022   K 4.7 09/07/2022   CL 101 09/07/2022   CREATININE 1.27 (H) 09/07/2022   BUN 33 (H) 09/07/2022   CO2 26 09/07/2022   TSH 2.11 04/29/2015   INR 1.1 12/02/2019   HGBA1C 5.6 07/04/2014    No results found.  Assessment & Plan:   Problem List Items Addressed This Visit       Cardiovascular and Mediastinum   Essential hypertension    Continue with metoprolol and losartan.  Hydrate well        Digestive   GERD (gastroesophageal reflux disease)    Continue with Nexium        Other   Weakness - Primary   Relevant Orders   Urinalysis (Completed)   Insomnia    Not better.  Increase Lexapro to 10 mg a day Lorazepam prn  Potential benefits of a long term opioids use as well as potential risks (i.e. addiction risk, apnea etc) and complications (i.e. Somnolence, constipation and others) were explained to the patient and were aknowledged.       Abdominal cramps    Not better.  Hyoscyamine as needed Hold Lipitor for 2 weeks Stop Cymbalta Follow-up with Dr. Henrene Pastor         Meds ordered this encounter  Medications   escitalopram (LEXAPRO) 5 MG tablet    Sig: Take 1 tablet (5 mg total) by mouth at bedtime.    Dispense:  90 tablet    Refill:  1      Follow-up: Return in about 6 weeks (around 01/08/2023) for a follow-up visit.  Walker Kehr, MD

## 2022-11-27 NOTE — Patient Instructions (Addendum)
Hold Lipitor x 1-2 weeks  Stop Cymbalta  Start Lexapro at night

## 2022-11-28 ENCOUNTER — Encounter: Payer: Self-pay | Admitting: Internal Medicine

## 2022-12-02 NOTE — Assessment & Plan Note (Signed)
Continue with Nexium

## 2022-12-02 NOTE — Assessment & Plan Note (Signed)
Continue with metoprolol and losartan.  Hydrate well

## 2022-12-02 NOTE — Assessment & Plan Note (Addendum)
Not better.  Hyoscyamine as needed Hold Lipitor for 2 weeks Stop Cymbalta Follow-up with Dr. Henrene Pastor

## 2022-12-02 NOTE — Assessment & Plan Note (Addendum)
Not better.  Increase Lexapro to 10 mg a day Lorazepam prn  Potential benefits of a long term opioids use as well as potential risks (i.e. addiction risk, apnea etc) and complications (i.e. Somnolence, constipation and others) were explained to the patient and were aknowledged.

## 2022-12-03 ENCOUNTER — Encounter: Payer: Self-pay | Admitting: Cardiology

## 2022-12-03 ENCOUNTER — Ambulatory Visit: Payer: Medicare Other | Attending: Cardiology | Admitting: Cardiology

## 2022-12-03 VITALS — BP 122/80 | HR 68 | Ht 63.0 in | Wt 143.8 lb

## 2022-12-03 DIAGNOSIS — I4821 Permanent atrial fibrillation: Secondary | ICD-10-CM

## 2022-12-03 DIAGNOSIS — I2510 Atherosclerotic heart disease of native coronary artery without angina pectoris: Secondary | ICD-10-CM

## 2022-12-03 DIAGNOSIS — I10 Essential (primary) hypertension: Secondary | ICD-10-CM

## 2022-12-03 DIAGNOSIS — E785 Hyperlipidemia, unspecified: Secondary | ICD-10-CM

## 2022-12-03 DIAGNOSIS — Z7901 Long term (current) use of anticoagulants: Secondary | ICD-10-CM | POA: Diagnosis not present

## 2022-12-03 DIAGNOSIS — I7121 Aneurysm of the ascending aorta, without rupture: Secondary | ICD-10-CM

## 2022-12-03 NOTE — Patient Instructions (Signed)
Medication Instructions:   Your physician recommends that you continue on your current medications as directed. Please refer to the Current Medication list given to you today.  *If you need a refill on your cardiac medications before your next appointment, please call your pharmacy*    Testing/Procedures:  Your physician has requested that you have an echocardiogram. Echocardiography is a painless test that uses sound waves to create images of your heart. It provides your doctor with information about the size and shape of your heart and how well your heart's chambers and valves are working. This procedure takes approximately one hour. There are no restrictions for this procedure.  SCHEDULE ECHO TO BE DONE IN DECEMBER 2024 PER DR. Johney Frame  Please do NOT wear cologne, perfume, aftershave, or lotions (deodorant is allowed). Please arrive 15 minutes prior to your appointment time.    Follow-Up: At [facility], you and your health needs are our priority.  As part of our continuing mission to provide you with exceptional heart care, we have created designated Provider Care Teams.  These Care Teams include your primary Cardiologist (physician) and Advanced Practice Providers (APPs -  Physician Assistants and Nurse Practitioners) who all work together to provide you with the care you need, when you need it.  We recommend signing up for the patient portal called "MyChart".  Sign up information is provided on this After Visit Summary.  MyChart is used to connect with patients for Virtual Visits (Telemedicine).  Patients are able to view lab/test results, encounter notes, upcoming appointments, etc.  Non-urgent messages can be sent to your provider as well.   To learn more about what you can do with MyChart, go to [email or web address].    Your next appointment:   6 month(s)  Provider:   DR. Johney Frame

## 2022-12-06 ENCOUNTER — Ambulatory Visit: Payer: Medicare Other | Admitting: Sports Medicine

## 2022-12-06 VITALS — BP 126/84 | Ht 63.0 in | Wt 140.0 lb

## 2022-12-06 DIAGNOSIS — M25561 Pain in right knee: Secondary | ICD-10-CM | POA: Diagnosis not present

## 2022-12-06 NOTE — Assessment & Plan Note (Signed)
Acute pain of right medial knee.  Ultrasound performed in office which showed effusion in suprapatellar pouch and spur/bulging around medial meniscus.  Most likely related to osteoarthritis flare.  She has been doing a lot of 1 like it balancing exercises which we discussed to stop for now.  We will give patient isometric and quadricep exercises to perform at home.  We have also given her compression to use during activity.  Discussed topical treatments, Tylenol and icing in the meantime.  Patient to contact office Monday/Tuesday if not improving for possible steroid injection for pain relief. -Compression sleeve given for activities -Avoid 1 legged balancing/squatting exercises -Tylenol, Voltaren gel, icing for pain control -Isometric/quadricep exercises -Return to clinic Monday/Tuesday if not improving for steroid injection

## 2022-12-06 NOTE — Progress Notes (Signed)
    SUBJECTIVE:   CHIEF COMPLAINT / HPI: Right knee pain  On Tuesday went to her gym class that she usually goes to 2 times a week.  She says that they have been doing more balancing exercises on one foot, they did squats, lifting leg over bars and other strengthening leg exercises She said on Tuesday night started to hurt in the medial anterior aspect of her right knee No swelling after close on the day No twisting or popping heard No trauma or falls Has been doing Tylenol every 4 hours -helping a little bit Has also been doing topical Voltaren and icing She has no pain at rest but when she is walking is when it hurts Extending leg also hurts Any kind of squatting makes it hurt  PERTINENT  PMH / PSH: afib on eliquis  OBJECTIVE:   BP 126/84   Ht 5\' 3"  (1.6 m)   Wt 140 lb (63.5 kg)   BMI 24.80 kg/m   Gen: NAD, awake and responsive to all questions Knee Exam -Inspection: no deformity, no discoloration -Palpation: medial joint line tenderness -ROM: Extension: 0 degrees; Flexion: 140 degrees.  Knee range of motion with pain on right with extension although able to fully extend/flex -Patient able to extend and flex his knee on his own  -5/5 strength b/l -Special Tests: Varus Stress: Negative; Valgus Stress: Negative; Anterior drawer: Negative; Posterior drawer: Thessaly: Positive -Limb neurovascularly intact, no instability noted   ASSESSMENT/PLAN:   Acute pain of right knee Acute pain of right medial knee.  Ultrasound performed in office which showed effusion in suprapatellar pouch and spur/bulging around medial meniscus.  Most likely related to osteoarthritis flare.  She has been doing a lot of 1 like it balancing exercises which we discussed to stop for now.  We will give patient isometric and quadricep exercises to perform at home.  We have also given her compression to use during activity.  Discussed topical treatments, Tylenol and icing in the meantime.  Patient to contact  office Monday/Tuesday if not improving for possible steroid injection for pain relief. -Compression sleeve given for activities -Avoid 1 legged balancing/squatting exercises -Tylenol, Voltaren gel, icing for pain control -Isometric/quadricep exercises -Return to clinic Monday/Tuesday if not improving for steroid injection  Gerrit Heck, MD [place]   Patient seen and evaluated with the resident.  I agree with the above plan of care.  Treatment as above but if symptoms do not begin to improve rapidly the patient will return to the office for consideration of cortisone injection.  This note was dictated using Dragon naturally speaking software and may contain errors in syntax, spelling, or content which have not been identified prior to signing this note.

## 2022-12-10 ENCOUNTER — Other Ambulatory Visit: Payer: Self-pay | Admitting: Internal Medicine

## 2022-12-11 ENCOUNTER — Ambulatory Visit: Payer: Medicare Other | Admitting: Sports Medicine

## 2022-12-11 VITALS — BP 130/82 | Ht 63.0 in | Wt 140.0 lb

## 2022-12-11 DIAGNOSIS — M25561 Pain in right knee: Secondary | ICD-10-CM | POA: Diagnosis not present

## 2022-12-11 MED ORDER — METHYLPREDNISOLONE ACETATE 40 MG/ML IJ SUSP
40.0000 mg | Freq: Once | INTRAMUSCULAR | Status: AC
  Administered 2022-12-11: 40 mg via INTRA_ARTICULAR

## 2022-12-11 NOTE — Progress Notes (Signed)
   Established Patient Office Visit  Subjective   Patient ID: Hailey [name], female    DOB: 1935-09-03  Age: 88 y.o. MRN: [id number]  Right knee pain  She is here today with chief complaint of right knee pain.  She was seen here last week and was believed to be suffering from an acute flare of osteoarthritis.  She had no obvious mechanism however she been doing more 1 leg at balance exercises with her trainer.  She was found to have suprapatellar effusion on ultrasound evaluation.  It was recommended that she ice the area, use over-the-counter medications as needed and work on some quad strengthening exercises.  She reports the exercises made her pain worse.  She was unable to get around very easily.  She is very disappointed as there was some nice weather yesterday and she was unable to do her errands secondary to the pain in her knee.  She is here today requesting an injection as she had discussed that with her provider last week.  She denies any catching locking or buckling.  ROS as listed above in HPI    Objective:     BP 130/82   Ht 5\' 3"  (1.6 m)   Wt 140 lb (63.5 kg)   BMI 24.80 kg/m   Physical Exam Vitals reviewed.  Constitutional:      General: She is not in acute distress.    Appearance: Normal appearance. She is not ill-appearing, toxic-appearing or diaphoretic.  Cardiovascular:     Pulses: Normal pulses.  Neurological:     Mental Status: She is alert.   Right knee: No obvious deformity or asymmetry.  Tenderness to palpation along the medial joint line.  Crepitus throughout range of motion.  Full range of motion with flexion and extension.  Stable to varus and valgus stress.  Some discomfort with McMurray's.  Effusion 1+.  She does have some old scarring from a previous skin graft over her knee that appears very well-healed.  Antalgic gait    Assessment & Plan:   Problem List Items Addressed This Visit       Other   Acute pain of right knee - Primary    Patient was  unable to complete exercises secondary to the pain in her knee.  She is here today requesting an injection.  I counseled her on the risk and the benefits.  She verbalized understanding.  Injection as detailed below.  I discussed with her and wrote out to avoid high impact activity while at the gym for the next month.  After 1 to 2 days she may revisit the quad exercises that we had given her.  She may continue at the gym with upper body exercises, elliptical bicycle and pool.  She verbalized understanding.  Follow-up as needed.  If this pain worsens or fails to improve would recommend further imaging studies at that time.      After informed written consent timeout was performed, patient was seated on exam table. Right knee was prepped with alcohol swab and utilizing anteromedial approach, patient's right knee was injected intraarticularly with 4:1 lidocaine: depomedrol. Patient tolerated the procedure well without immediate complications.   Return if symptoms worsen or fail to improve.    Elmore Guise, DO  I observed and examined the patient with the [facility] resident and agree with assessment and plan.  Note reviewed and modified by me. Ila Mcgill, MD

## 2022-12-11 NOTE — Patient Instructions (Signed)
Today we gave you a shot in your knee, take it easy for the next 2 days then begin the quad exercises we provided you with last week. You may return to the gym after but avoid, weighted squatting, deep squatting, jumping or running for the next month.  You may do upper body exercises, swimming, elliptical or the bike.  Continue to use your compression sleeve and ice.

## 2022-12-11 NOTE — Assessment & Plan Note (Signed)
Patient was unable to complete exercises secondary to the pain in her knee.  She is here today requesting an injection.  I counseled her on the risk and the benefits.  She verbalized understanding.  Injection as detailed below.  I discussed with her and wrote out to avoid high impact activity while at the gym for the next month.  After 1 to 2 days she may revisit the quad exercises that we had given her.  She may continue at the gym with upper body exercises, elliptical bicycle and pool.  She verbalized understanding.  Follow-up as needed.  If this pain worsens or fails to improve would recommend further imaging studies at that time.

## 2022-12-12 ENCOUNTER — Telehealth: Payer: Self-pay | Admitting: Internal Medicine

## 2022-12-12 DIAGNOSIS — D225 Melanocytic nevi of trunk: Secondary | ICD-10-CM | POA: Diagnosis not present

## 2022-12-12 DIAGNOSIS — L821 Other seborrheic keratosis: Secondary | ICD-10-CM | POA: Diagnosis not present

## 2022-12-12 DIAGNOSIS — L578 Other skin changes due to chronic exposure to nonionizing radiation: Secondary | ICD-10-CM | POA: Diagnosis not present

## 2022-12-12 DIAGNOSIS — D0471 Carcinoma in situ of skin of right lower limb, including hip: Secondary | ICD-10-CM | POA: Diagnosis not present

## 2022-12-12 DIAGNOSIS — D1809 Hemangioma of other sites: Secondary | ICD-10-CM | POA: Diagnosis not present

## 2022-12-12 DIAGNOSIS — L570 Actinic keratosis: Secondary | ICD-10-CM | POA: Diagnosis not present

## 2022-12-12 DIAGNOSIS — D485 Neoplasm of uncertain behavior of skin: Secondary | ICD-10-CM | POA: Diagnosis not present

## 2022-12-12 MED ORDER — LORAZEPAM 1 MG PO TABS: ORAL_TABLET | ORAL | 1 refills | Status: DC

## 2022-12-12 NOTE — Telephone Encounter (Signed)
Prescription Request  12/12/2022  LOV: 11/27/2022  What is the name of the medication or equipment? LORazepam (ATIVAN) 1 MG tablet   Have you contacted your pharmacy to request a refill? Yes   Which pharmacy would you like this sent to?  [facility], [place] [address] Phone: [phone] Fax: [phone]    Patient notified that their request is being sent to the clinical staff for review and that they should receive a response within 2 business days.   Please advise at Mobile [phone] (mobile)

## 2022-12-12 NOTE — Telephone Encounter (Signed)
Okay.  Thanks.

## 2022-12-17 ENCOUNTER — Telehealth: Payer: Self-pay | Admitting: Physical Medicine and Rehabilitation

## 2022-12-17 NOTE — Telephone Encounter (Signed)
Patient asking if she needs to be seen due to pain in her back going down her leg.Marland Kitchen

## 2022-12-18 ENCOUNTER — Encounter: Payer: Self-pay | Admitting: Physical Medicine and Rehabilitation

## 2022-12-19 ENCOUNTER — Other Ambulatory Visit: Payer: Self-pay | Admitting: Physical Medicine and Rehabilitation

## 2022-12-19 DIAGNOSIS — M5416 Radiculopathy, lumbar region: Secondary | ICD-10-CM

## 2022-12-19 NOTE — Telephone Encounter (Signed)
Patient states she is having pain on both sides of lower back and it is radiating down the right leg to the knee

## 2022-12-22 ENCOUNTER — Other Ambulatory Visit: Payer: Self-pay | Admitting: Internal Medicine

## 2022-12-31 ENCOUNTER — Encounter: Payer: Self-pay | Admitting: Internal Medicine

## 2022-12-31 ENCOUNTER — Ambulatory Visit (INDEPENDENT_AMBULATORY_CARE_PROVIDER_SITE_OTHER): Payer: Medicare Other | Admitting: Internal Medicine

## 2022-12-31 VITALS — BP 120/90 | HR 58 | Temp 97.8°F | Ht 63.0 in | Wt 144.0 lb

## 2022-12-31 DIAGNOSIS — E538 Deficiency of other specified B group vitamins: Secondary | ICD-10-CM | POA: Diagnosis not present

## 2022-12-31 DIAGNOSIS — I25119 Atherosclerotic heart disease of native coronary artery with unspecified angina pectoris: Secondary | ICD-10-CM | POA: Diagnosis not present

## 2022-12-31 DIAGNOSIS — N182 Chronic kidney disease, stage 2 (mild): Secondary | ICD-10-CM

## 2022-12-31 DIAGNOSIS — N179 Acute kidney failure, unspecified: Secondary | ICD-10-CM | POA: Diagnosis not present

## 2022-12-31 DIAGNOSIS — E785 Hyperlipidemia, unspecified: Secondary | ICD-10-CM | POA: Diagnosis not present

## 2022-12-31 DIAGNOSIS — R296 Repeated falls: Secondary | ICD-10-CM

## 2022-12-31 DIAGNOSIS — Z789 Other specified health status: Secondary | ICD-10-CM | POA: Insufficient documentation

## 2022-12-31 NOTE — Progress Notes (Signed)
Subjective:  Patient ID: Hailey [name], female    DOB: [birth year]  Age: 88 y.o. MRN: [id number]  CC: No chief complaint on file.   HPI Hailey [name] presents for anxiety - better Pt stopped Lipitor due to abd pain and weakness - resolved C/o LBP at times   Outpatient Medications Prior to Visit  Medication Sig Dispense Refill   acetaminophen (TYLENOL) 500 MG tablet Take 1,000 mg by mouth every 6 (six) hours as needed (body aches / sore hip). Maximum of 6 tablets daily (3000mg )     apixaban (ELIQUIS) 2.5 MG TABS tablet Take 1 tablet (2.5 mg total) by mouth 2 (two) times daily. 200 tablet 2   BIOTIN PO Take 5,000 mcg by mouth in the morning.     Cholecalciferol (VITAMIN D3) 50 MCG (2000 UT) capsule Take 1 capsule (2,000 Units total) by mouth daily. 100 capsule 3   Cyanocobalamin (VITAMIN B-12) 1000 MCG SUBL Place 1 tablet (1,000 mcg total) under the tongue daily. 100 tablet 3   diclofenac Sodium (VOLTAREN) 1 % GEL Apply 2 g topically 4 (four) times daily as needed (back pain.).     dicyclomine (BENTYL) 20 MG tablet Take 1 by mouth every 4-6 hours as needed for cramping (Patient taking differently: Take 20 mg by mouth 4 (four) times daily -  before meals and at bedtime. Take 1 by mouth every 4-6 hours as needed for cramping) 30 tablet 3   empagliflozin (JARDIANCE) 10 MG TABS tablet Take 1 tablet (10 mg total) by mouth daily before breakfast. 30 tablet 9   escitalopram (LEXAPRO) 5 MG tablet Take 1 tablet (5 mg total) by mouth at bedtime. 90 tablet 1   esomeprazole (NEXIUM) 40 MG capsule TAKE ONE CAPSULE ONCE DAILY 90 capsule 1   Homeopathic Products (ARNICARE) GEL Apply 1 application topically daily as needed (skin bruising).     hyoscyamine (LEVSIN) 0.125 MG tablet TAKE 1-2 TABLETS (0.125-0.25 MG) BY MOUTH EVERY 4 HOURS AS NEEDED FOR UP TO 10 DAYS FOR CRAMPING. (Patient taking differently: Take 0.125-0.25 mg by mouth every 4 (four) hours as needed for cramping.) 100 tablet 1   LORazepam  (ATIVAN) 1 MG tablet TAKE ONE TABLET BY MOUTH TWICE DAILY AS NEEDED FOR ANXIETY / SLEEP. 180 tablet 1   losartan (COZAAR) 25 MG tablet Take 0.5 tablets (12.5 mg total) by mouth daily. 45 tablet 3   meclizine (ANTIVERT) 12.5 MG tablet Take 1 tablet (12.5 mg total) by mouth 3 (three) times daily as needed for dizziness. 60 tablet 1   metoprolol succinate (TOPROL-XL) 25 MG 24 hr tablet Take 0.5 tablets (12.5 mg total) by mouth daily. 45 tablet 3   nitroGLYCERIN (NITROSTAT) 0.4 MG SL tablet Place 1 tablet (0.4 mg total) under the tongue every 5 (five) minutes as needed for chest pain (Call 911 at 3rd dose within 15 minutes.). 20 tablet 3   ondansetron (ZOFRAN-ODT) 8 MG disintegrating tablet Take 1 tablet (8 mg total) by mouth every 8 (eight) hours as needed for nausea or vomiting. 12 tablet 3   Polyethyl Glycol-Propyl Glycol (LUBRICANT EYE DROPS) 0.4-0.3 % SOLN Place 1-2 drops into both eyes at bedtime.     sodium chloride (OCEAN) 0.65 % SOLN nasal spray Place 1 spray into both nostrils at bedtime as needed for congestion.     No facility-administered medications prior to visit.    ROS: Review of Systems  Constitutional:  Negative for activity change, appetite change, chills, fatigue and unexpected weight change.  HENT:  Negative for congestion, mouth sores and sinus pressure.   Eyes:  Negative for visual disturbance.  Respiratory:  Negative for cough and chest tightness.   Gastrointestinal:  Negative for abdominal pain and nausea.  Genitourinary:  Negative for difficulty urinating, frequency and vaginal pain.  Musculoskeletal:  Negative for back pain and gait problem.  Skin:  Negative for pallor and rash.  Neurological:  Negative for dizziness, tremors, weakness, numbness and headaches.  Psychiatric/Behavioral:  Negative for confusion and sleep disturbance.     Objective:  BP (!) 120/90 (BP Location: Left Arm, Patient Position: Sitting, Cuff Size: Normal)   Pulse (!) 58   Temp 97.8 F (36.6  C) (Oral)   Ht  (1.6 m)   Wt 144 lb (65.3 kg)   SpO2 96%   BMI 25.51 kg/m   BP Readings from Last 3 Encounters:  12/31/22 (!) 120/90  12/11/22 130/82  12/06/22 126/84    Wt Readings from Last 3 Encounters:  12/31/22 144 lb (65.3 kg)  12/11/22 140 lb (63.5 kg)  12/06/22 140 lb (63.5 kg)    Physical Exam Constitutional:      General: She is not in acute distress.    Appearance: She is well-developed.  HENT:     Head: Normocephalic.     Right Ear: External ear normal.     Left Ear: External ear normal.     Nose: Nose normal.  Eyes:     General:        Right eye: No discharge.        Left eye: No discharge.     Conjunctiva/sclera: Conjunctivae normal.     Pupils: Pupils are equal, round, and reactive to light.  Neck:     Thyroid: No thyromegaly.     Vascular: No JVD.     Trachea: No tracheal deviation.  Cardiovascular:     Rate and Rhythm: Normal rate and regular rhythm.     Heart sounds: Normal heart sounds.  Pulmonary:     Effort: No respiratory distress.     Breath sounds: No stridor. No wheezing.  Abdominal:     General: Bowel sounds are normal. There is no distension.     Palpations: Abdomen is soft. There is no mass.     Tenderness: There is no abdominal tenderness. There is no guarding or rebound.  Musculoskeletal:        General: No tenderness.     Cervical back: Normal range of motion and neck supple. No rigidity.  Lymphadenopathy:     Cervical: No cervical adenopathy.  Skin:    Findings: No erythema or rash.  Neurological:     Mental Status: She is oriented to person, place, and time.     Cranial Nerves: No cranial nerve deficit.     Motor: No weakness or abnormal muscle tone.     Coordination: Coordination normal.     Gait: Gait normal.     Deep Tendon Reflexes: Reflexes normal.  Psychiatric:        Behavior: Behavior normal.        Thought Content: Thought content normal.        Judgment: Judgment normal.     Lab Results  Component  Value Date   WBC 6.4 09/07/2022   HGB 13.5 09/07/2022   HCT 40.6 09/07/2022   PLT 236.0 09/07/2022   GLUCOSE 87 09/07/2022   CHOL 167 01/29/2022   TRIG 113 01/29/2022   HDL 109 01/29/2022   LDLDIRECT 89.1  06/29/2013   LDLCALC 39 01/29/2022   ALT 14 09/07/2022   AST 24 09/07/2022   NA 134 (L) 09/07/2022   K 4.7 09/07/2022   CL 101 09/07/2022   CREATININE 1.27 (H) 09/07/2022   BUN 33 (H) 09/07/2022   CO2 26 09/07/2022   TSH 2.11 04/29/2015   INR 1.1 12/02/2019   HGBA1C 5.6 07/04/2014    No results found.  Assessment & Plan:   Problem List Items Addressed This Visit       Cardiovascular and Mediastinum   Coronary artery disease involving native coronary artery of native heart with angina pectoris    Pt stopped Lipitor due to abd pain and weakness - resolved Other options discussed      Relevant Orders   CBC with Differential/Platelet   Comprehensive metabolic panel   TSH   Lipid panel     Genitourinary   Acute renal failure superimposed on stage 2 chronic kidney disease    Hydrate well      Relevant Orders   CBC with Differential/Platelet   Comprehensive metabolic panel   TSH   Lipid panel     Other   Falls frequently    Resolved off statins       Low serum vitamin B12    On B12      Statin intolerance - Primary    Pt stopped Lipitor due to abd pain and weakness - resolved      Other Visit Diagnoses     Dyslipidemia       Relevant Orders   TSH   Lipid panel         No orders of the defined types were placed in this encounter.     Follow-up: Return in about 3 months (around 04/01/2023) for a follow-up visit.  Sonda Primes, MD

## 2022-12-31 NOTE — Assessment & Plan Note (Signed)
Resolved off statins

## 2022-12-31 NOTE — Assessment & Plan Note (Signed)
Pt stopped Lipitor due to abd pain and weakness - resolved

## 2022-12-31 NOTE — Assessment & Plan Note (Signed)
Pt stopped Lipitor due to abd pain and weakness - resolved Other options discussed

## 2022-12-31 NOTE — Assessment & Plan Note (Signed)
On B12 

## 2022-12-31 NOTE — Assessment & Plan Note (Signed)
Hydrate well 

## 2023-01-01 ENCOUNTER — Encounter: Payer: Medicare Other | Admitting: Physical Medicine and Rehabilitation

## 2023-01-02 ENCOUNTER — Ambulatory Visit: Payer: Medicare Other | Admitting: Physical Medicine and Rehabilitation

## 2023-01-02 ENCOUNTER — Other Ambulatory Visit: Payer: Self-pay

## 2023-01-02 VITALS — BP 131/89 | HR 59

## 2023-01-02 DIAGNOSIS — M5416 Radiculopathy, lumbar region: Secondary | ICD-10-CM

## 2023-01-02 MED ORDER — METHYLPREDNISOLONE ACETATE 80 MG/ML IJ SUSP
80.0000 mg | Freq: Once | INTRAMUSCULAR | Status: AC
Start: 2023-01-02 — End: 2023-01-02
  Administered 2023-01-02: 80 mg

## 2023-01-02 NOTE — Patient Instructions (Signed)

## 2023-01-02 NOTE — Progress Notes (Signed)
Functional Pain Scale - descriptive words and definitions  Moderate (4)   Constantly aware of pain, can complete ADLs with modification/sleep marginally affected at times/passive distraction is of no use, but active distraction gives some relief. Moderate range order  Average Pain 3-4   +Driver, -BT, -Dye Allergies.  Lower back pain on both sides that radiates into hips and legs

## 2023-01-08 NOTE — Progress Notes (Signed)
Hailey [name] - 88 y.o. female MRN [id number]  Date of birth: 26-Mar-1935  Office Visit Note: Visit Date: 01/02/2023 PCP: Tresa Garter, MD Referred by: Tresa Garter, MD  Subjective: Chief Complaint  Patient presents with   Lower Back - Pain   HPI:  Hailey [name] is a 88 y.o. female who comes in today at the request of Ellin Goodie, FNP for planned Bilateral L4-5 Lumbar Transforaminal epidural steroid injection with fluoroscopic guidance.  The patient has failed conservative care including home exercise, medications, time and activity modification.  This injection will be diagnostic and hopefully therapeutic.  Please see requesting physician notes for further details and justification.   ROS Otherwise per HPI.  Assessment & Plan: Visit Diagnoses:    ICD-10-CM   1. Lumbar radiculopathy  M54.16 XR C-ARM NO REPORT    Epidural Steroid injection    methylPREDNISolone acetate (DEPO-MEDROL) injection 80 mg      Plan: No additional findings.   Meds & Orders:  Meds ordered this encounter  Medications   methylPREDNISolone acetate (DEPO-MEDROL) injection 80 mg    Orders Placed This Encounter  Procedures   XR C-ARM NO REPORT   Epidural Steroid injection    Follow-up: Return if symptoms worsen or fail to improve.   Procedures: No procedures performed  Lumbosacral Transforaminal Epidural Steroid Injection - Sub-Pedicular Approach with Fluoroscopic Guidance  Patient: Hailey [name]      Date of Birth: 06-10-1935 MRN: [id number] PCP: Tresa Garter, MD      Visit Date: 01/02/2023   Universal Protocol:    Date/Time: 01/02/2023  Consent Given By: the patient  Position: PRONE  Additional Comments: Vital signs were monitored before and after the procedure. Patient was prepped and draped in the usual sterile fashion. The correct patient, procedure, and site was verified.   Injection Procedure Details:   Procedure diagnoses: Lumbar radiculopathy  [M54.16]    Meds Administered:  Meds ordered this encounter  Medications   methylPREDNISolone acetate (DEPO-MEDROL) injection 80 mg    Laterality: Bilateral  Location/Site: L4  Needle:5.0 in., 22 ga.  Short bevel or Quincke spinal needle  Needle Placement: Transforaminal  Findings:    -Comments: Excellent flow of contrast along the nerve, nerve root and into the epidural space.  Procedure Details: After squaring off the end-plates to get a true AP view, the C-arm was positioned so that an oblique view of the foramen as noted above was visualized. The target area is just inferior to the "nose of the scotty dog" or sub pedicular. The soft tissues overlying this structure were infiltrated with 2-3 ml. of 1% Lidocaine without Epinephrine.  The spinal needle was inserted toward the target using a "trajectory" view along the fluoroscope beam.  Under AP and lateral visualization, the needle was advanced so it did not puncture dura and was located close the 6 O'Clock position of the pedical in AP tracterory. Biplanar projections were used to confirm position. Aspiration was confirmed to be negative for CSF and/or blood. A 1-2 ml. volume of Isovue-250 was injected and flow of contrast was noted at each level. Radiographs were obtained for documentation purposes.   After attaining the desired flow of contrast documented above, a 0.5 to 1.0 ml test dose of 0.25% Marcaine was injected into each respective transforaminal space.  The patient was observed for 90 seconds post injection.  After no sensory deficits were reported, and normal lower extremity motor function was noted,   the above injectate  was administered so that equal amounts of the injectate were placed at each foramen (level) into the transforaminal epidural space.   Additional Comments:  No complications occurred Dressing: 2 x 2 sterile gauze and Band-Aid    Post-procedure details: Patient was observed during the  procedure. Post-procedure instructions were reviewed.  Patient left the clinic in stable condition.    Clinical History: EXAM: MRI LUMBAR SPINE WITHOUT CONTRAST   TECHNIQUE: Multiplanar, multisequence MR imaging of the lumbar spine was performed. No intravenous contrast was administered.   COMPARISON:  MR lumbar 03/20/2017; X-ray lumbar 09/14/2021.   FINDINGS: Segmentation:  Standard.   Alignment: Scoliotic thoracolumbar curvature. Mild retrolisthesis at L1-2, L2-3, and L3-4.   Vertebrae: No fracture, evidence of discitis, or suspicious bone lesion. Scattered intraosseous hemangiomas.   Conus medullaris and cauda equina: Conus extends to the L1 level. Conus and cauda equina appear normal.   Paraspinal and other soft tissues: No acute findings.   Disc levels:   T12-L1: Mild disc bulge. No foraminal or canal stenosis. Unchanged.   L1-L2: Disc height loss with mild disc bulge and endplate ridging. Mild left foraminal stenosis. No canal stenosis. Unchanged.   L2-L3: Retrolisthesis. Diffuse disc bulge with small caudally extending right paracentral disc extrusion. Mild bilateral facet arthropathy. Mild canal stenosis with moderate bilateral foraminal stenosis. Findings have progressed from prior.   L3-L4: Diffuse disc bulge with mild bilateral facet arthropathy and ligamentum flavum buckling. Findings result in mild-moderate canal stenosis with moderate right foraminal stenosis. Findings progressed from prior.   L4-L5: Disc bulge with endplate osteophytic ridging. Advanced bilateral facet arthropathy. Severe right and mild left foraminal stenosis. No significant interval progression.   L5-S1: Disc height loss and endplate ridging. Mild facet hypertrophy. No canal stenosis. Mild-to-moderate left and mild right foraminal stenosis. Unchanged.   IMPRESSION: 1. Multilevel lumbar spondylosis, slightly progressed from prior MRI. 2. Mild-moderate canal stenosis with  moderate right foraminal stenosis at L3-4. 3. Mild canal stenosis and moderate bilateral foraminal stenosis at L2-3. 4. Severe right and mild left foraminal stenosis at L4-5.     Electronically Signed   By: Duanne Guess D.O.   On: 11/29/2021 13:23     Objective:  VS:  HT:    WT:   BMI:     BP:131/89  HR:(!) 59bpm  TEMP: ( )  RESP:  Physical Exam Vitals and nursing note reviewed.  Constitutional:      General: She is not in acute distress.    Appearance: Normal appearance. She is not ill-appearing.  HENT:     Head: Normocephalic and atraumatic.     Right Ear: External ear normal.     Left Ear: External ear normal.  Eyes:     Extraocular Movements: Extraocular movements intact.  Cardiovascular:     Rate and Rhythm: Normal rate.     Pulses: Normal pulses.  Pulmonary:     Effort: Pulmonary effort is normal. No respiratory distress.  Abdominal:     General: There is no distension.     Palpations: Abdomen is soft.  Musculoskeletal:        General: Tenderness present.     Cervical back: Neck supple.     Right lower leg: No edema.     Left lower leg: No edema.     Comments: Patient has good distal strength with no pain over the greater trochanters.  No clonus or focal weakness.  Skin:    Findings: No erythema, lesion or rash.  Neurological:  General: No focal deficit present.     Mental Status: She is alert and oriented to person, place, and time.     Sensory: No sensory deficit.     Motor: No weakness or abnormal muscle tone.     Coordination: Coordination normal.  Psychiatric:        Mood and Affect: Mood normal.        Behavior: Behavior normal.      Imaging: No results found.

## 2023-01-08 NOTE — Procedures (Signed)
Lumbosacral Transforaminal Epidural Steroid Injection - Sub-Pedicular Approach with Fluoroscopic Guidance  Patient: Hailey [name]      Date of Birth: 11/18/1934 MRN: [id number] PCP: Tresa Garter, MD      Visit Date: 01/02/2023   Universal Protocol:    Date/Time: 01/02/2023  Consent Given By: the patient  Position: PRONE  Additional Comments: Vital signs were monitored before and after the procedure. Patient was prepped and draped in the usual sterile fashion. The correct patient, procedure, and site was verified.   Injection Procedure Details:   Procedure diagnoses: Lumbar radiculopathy [M54.16]    Meds Administered:  Meds ordered this encounter  Medications   methylPREDNISolone acetate (DEPO-MEDROL) injection 80 mg    Laterality: Bilateral  Location/Site: L4  Needle:5.0 in., 22 ga.  Short bevel or Quincke spinal needle  Needle Placement: Transforaminal  Findings:    -Comments: Excellent flow of contrast along the nerve, nerve root and into the epidural space.  Procedure Details: After squaring off the end-plates to get a true AP view, the C-arm was positioned so that an oblique view of the foramen as noted above was visualized. The target area is just inferior to the "nose of the scotty dog" or sub pedicular. The soft tissues overlying this structure were infiltrated with 2-3 ml. of 1% Lidocaine without Epinephrine.  The spinal needle was inserted toward the target using a "trajectory" view along the fluoroscope beam.  Under AP and lateral visualization, the needle was advanced so it did not puncture dura and was located close the 6 O'Clock position of the pedical in AP tracterory. Biplanar projections were used to confirm position. Aspiration was confirmed to be negative for CSF and/or blood. A 1-2 ml. volume of Isovue-250 was injected and flow of contrast was noted at each level. Radiographs were obtained for documentation purposes.   After attaining the  desired flow of contrast documented above, a 0.5 to 1.0 ml test dose of 0.25% Marcaine was injected into each respective transforaminal space.  The patient was observed for 90 seconds post injection.  After no sensory deficits were reported, and normal lower extremity motor function was noted,   the above injectate was administered so that equal amounts of the injectate were placed at each foramen (level) into the transforaminal epidural space.   Additional Comments:  No complications occurred Dressing: 2 x 2 sterile gauze and Band-Aid    Post-procedure details: Patient was observed during the procedure. Post-procedure instructions were reviewed.  Patient left the clinic in stable condition.

## 2023-01-14 ENCOUNTER — Ambulatory Visit: Payer: Medicare Other | Admitting: Obstetrics and Gynecology

## 2023-01-23 ENCOUNTER — Encounter: Payer: Self-pay | Admitting: Obstetrics and Gynecology

## 2023-01-23 DIAGNOSIS — C44722 Squamous cell carcinoma of skin of right lower limb, including hip: Secondary | ICD-10-CM | POA: Diagnosis not present

## 2023-01-23 DIAGNOSIS — L570 Actinic keratosis: Secondary | ICD-10-CM | POA: Diagnosis not present

## 2023-01-23 NOTE — Telephone Encounter (Signed)
FYI. Pt scheduled for 01/29/2023. Will route to provider for final review and close.

## 2023-01-27 ENCOUNTER — Encounter: Payer: Self-pay | Admitting: Internal Medicine

## 2023-01-28 ENCOUNTER — Other Ambulatory Visit: Payer: Self-pay

## 2023-01-28 ENCOUNTER — Encounter: Payer: Self-pay | Admitting: Obstetrics and Gynecology

## 2023-01-28 MED ORDER — RIFAXIMIN 550 MG PO TABS: 550.0000 mg | ORAL_TABLET | Freq: Three times a day (TID) | ORAL | 0 refills | Status: DC

## 2023-01-28 NOTE — Telephone Encounter (Signed)
Sent to me in error.  Will forward to Dr. Eda Keys

## 2023-01-29 ENCOUNTER — Telehealth: Payer: Self-pay

## 2023-01-29 ENCOUNTER — Other Ambulatory Visit (HOSPITAL_COMMUNITY): Payer: Self-pay

## 2023-01-29 ENCOUNTER — Ambulatory Visit: Payer: Medicare Other | Admitting: Obstetrics and Gynecology

## 2023-01-29 NOTE — Telephone Encounter (Signed)
*  GASTRO  PA request received via [facility] for Xifaxan 550MG  tablets  *patient history of IBS-D *Loperamide/Immodium caused constipation  PA submitted to [employer] Part D and is pending additional questions/determination  Key: [id number]

## 2023-01-29 NOTE — Telephone Encounter (Signed)
PA has been APPROVED from 01/29/2023-02/12/2023

## 2023-01-30 ENCOUNTER — Encounter: Payer: Self-pay | Admitting: Obstetrics and Gynecology

## 2023-01-30 ENCOUNTER — Ambulatory Visit: Payer: Medicare Other | Admitting: Obstetrics and Gynecology

## 2023-01-30 NOTE — Progress Notes (Unsigned)
GYNECOLOGY  VISIT   HPI: 88 y.o.   Married  Caucasian  female   G2P2001 with No LMP recorded. Patient has had a hysterectomy.   here for   TX option. Pt has itching, pain with urination and discomfort with sitting  GYNECOLOGIC HISTORY: No LMP recorded. Patient has had a hysterectomy. Contraception:  hyst Menopausal hormone therapy:  n/a Last mammogram:  02/08/22 Breast Density Category C, BI-RADS CATEGORY 1 Neg  Last pap smear:   many years ago        OB History     Gravida  2   Para  2   Term  2   Preterm      AB      Living  1      SAB      IAB      Ectopic      Multiple      Live Births  2              Patient Active Problem List   Diagnosis Date Noted   Statin intolerance 12/31/2022   Acute pain of right knee 12/06/2022   Impetigo 10/01/2022   Insomnia 10/01/2022   Osteoporosis 06/11/2022   Hematoma of right thigh 04/25/2022   Weakness 04/25/2022   Weakness generalized 04/25/2022   Falls frequently 04/19/2022   Iron deficiency anemia 12/08/2021   Skin ulcer, limited to breakdown of skin (HCC) 11/22/2021   Anemia, iron deficiency 11/22/2021   Low serum vitamin B12 11/22/2021   Acute renal failure superimposed on stage 2 chronic kidney disease (HCC) 11/20/2021   ABLA (acute blood loss anemia) 11/20/2021   Hypotension 11/19/2021   Shortness of breath 06/12/2021   Right leg pain 02/21/2021   Abnormal CT of the abdomen    Aortic atherosclerosis (HCC) 08/27/2020   Acute prerenal azotemia 08/27/2020   Shock circulatory (HCC) 08/27/2020   Metabolic acidosis with normal anion gap and bicarbonate losses 08/27/2020   Abdominal cramps 03/16/2020   Neurogenic orthostatic hypotension (HCC) 09/16/2019   Greater trochanteric pain syndrome of right lower extremity 07/30/2019   Bradycardia with 41-50 beats per minute 12/27/2017   Right bundle branch block (RBBB) on electrocardiogram (ECG) 12/27/2017   Chronic kidney disease, stage 3b (HCC) 12/24/2017    Cellulitis of leg, right with large prepatella hematoma and open wounds 12/24/2017   Iliotibial band syndrome of right side 12/16/2017   Intervertebral lumbar disc disorder with myelopathy, lumbar region 04/25/2017   Family history of colon cancer in mother 01/22/2017   Primary osteoarthritis of both first carpometacarpal joints 01/02/2017   Pain 11/21/2016   Gastroenteritis 09/13/2016   Chronic anticoagulation 05/24/2015   Coronary artery disease involving native coronary artery of native heart with angina pectoris (HCC) 12/20/2014   Permanent atrial fibrillation (HCC) 11/21/2012   Greater trochanteric bursitis of both hips 05/21/2012   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   Osteoarthritis of left hip 07/03/2010   DEGENERATIVE DISC DISEASE, LUMBOSACRAL SPINE 05/18/2010   SYNCOPE 10/27/2008   Essential hypertension 06/16/2007   GERD (gastroesophageal reflux disease) 06/16/2007    Past Medical History:  Diagnosis Date   Acute blood loss anemia 12/25/2017   Allergic rhinitis    Anxiety    Arthritis    "my whole spine" (07/01/2017)   Atrial fibrillation (HCC)    Bowel obstruction (HCC)    in [place]   Bradycardia with 41-50 beats per minute 12/27/2017   Cancer (HCC)    Cellulitis of leg, right with large prepatella  hematoma and open wounds 12/26/2017   CKD (chronic kidney disease) stage 2, GFR 60-89 ml/min    Colon polyps    Coronary artery disease    10/18 PCI/DES to p/m LCx with cutting balloon to mLcx   Diverticulosis of colon    GERD (gastroesophageal reflux disease)    Hip bursitis 2010   Dr Wyline Mood, Post op seroma   History of colon polyps    HSV (herpes simplex virus) anogenital infection 07/2019   HTN (hypertension)    IBS (irritable bowel syndrome)    constipation predominant - Dr Kinnie Scales   Lichen sclerosus    Osteopenia 11/2016   T score -2.0 FRAX 15%/4.3%   PAC (premature atrial contraction)    Symptomatiic   Renal insufficiency    Right bundle branch block  (RBBB) on electrocardiogram (ECG) 12/27/2017   Scoliosis    SVT (supraventricular tachycardia)    brief history   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   biopsy showing vulvar atypia, possible VIN I    Past Surgical History:  Procedure Laterality Date   ANTERIOR AND POSTERIOR VAGINAL REPAIR  01/2002   Hattie Perch 01/23/2011   APPENDECTOMY  1946   CARDIAC CATHETERIZATION  06/26/2017   CORONARY ANGIOPLASTY WITH STENT PLACEMENT  07/01/2017   CORONARY ATHERECTOMY N/A 07/01/2017   Procedure: CORONARY ATHERECTOMY;  Surgeon: Lyn Records, MD;  Location: [facility];  Service: Cardiovascular;  Laterality: N/A;   CORONARY STENT INTERVENTION N/A 07/01/2017   Procedure: CORONARY STENT INTERVENTION;  Surgeon: Lyn Records, MD;  Location: [facility];  Service: Cardiovascular;  Laterality: N/A;   HAMMER TOE SURGERY     HEMORRHOID BANDING     HIP SURGERY Left 04/2009   hip examination under anesthesia followed by greater trochanteric bursectomy; iliotibial band tenotomy/notes 01/20/2011   I & D EXTREMITY Right 01/10/2018   Procedure: IRRIGATION AND DEBRIDEMENT RIGHT KNEE, APPLY WOUND VAC;  Surgeon: Nadara Mustard, MD;  Location: [facility] OR;  Service: Orthopedics;  Laterality: Right;   KNEE BURSECTOMY Right 04/2009   Hattie Perch 01/09/2011   LEFT ATRIAL APPENDAGE OCCLUSION N/A 08/24/2021   Procedure: LEFT ATRIAL APPENDAGE OCCLUSION;  Surgeon: Tonny Bollman, MD;  Location: [facility] INVASIVE CV LAB;  Service: Cardiovascular;  Laterality: N/A;   LEFT HEART CATH AND CORONARY ANGIOGRAPHY N/A 06/26/2017   Procedure: LEFT HEART CATH AND CORONARY ANGIOGRAPHY;  Surgeon: Lyn Records, MD;  Location: [facility];  Service: Cardiovascular;  Laterality: N/A;   PUBOVAGINAL SLING  01/2002   Hattie Perch 01/23/2011   REDUCTION MAMMAPLASTY     TEE WITHOUT CARDIOVERSION N/A 08/24/2021   Procedure: TRANSESOPHAGEAL ECHOCARDIOGRAM (TEE);  Surgeon: Tonny Bollman, MD;  Location: [facility] INVASIVE CV LAB;  Service:  Cardiovascular;  Laterality: N/A;   TEMPORARY PACEMAKER N/A 07/01/2017   Procedure: TEMPORARY PACEMAKER;  Surgeon: Lyn Records, MD;  [facility]: [facility] INVASIVE CV LAB;  Service: Cardiovascular;  Laterality: N/A;   VAGINAL HYSTERECTOMY  01/2002   Vaginal hysterectomy, bilateral salpingo-oophorectomy/notes 01/23/2011    Current Outpatient Medications  Medication Sig Dispense Refill   acetaminophen (TYLENOL) 500 MG tablet Take 1,000 mg by mouth every 6 (six) hours as needed (body aches / sore hip). Maximum of 6 tablets daily (3000mg )     apixaban (ELIQUIS) 2.5 MG TABS tablet Take 1 tablet (2.5 mg total) by mouth 2 (two) times daily. 200 tablet 2   BIOTIN PO Take 5,000 mcg by mouth in the morning.     Cholecalciferol (VITAMIN D3) 50  MCG (2000 UT) capsule Take 1 capsule (2,000 Units total) by mouth daily. 100 capsule 3   Cyanocobalamin (VITAMIN B-12) 1000 MCG SUBL Place 1 tablet (1,000 mcg total) under the tongue daily. 100 tablet 3   diclofenac Sodium (VOLTAREN) 1 % GEL Apply 2 g topically 4 (four) times daily as needed (back pain.).     dicyclomine (BENTYL) 20 MG tablet Take 1 by mouth every 4-6 hours as needed for cramping (Patient taking differently: Take 20 mg by mouth 4 (four) times daily -  before meals and at bedtime. Take 1 by mouth every 4-6 hours as needed for cramping) 30 tablet 3   empagliflozin (JARDIANCE) 10 MG TABS tablet Take 1 tablet (10 mg total) by mouth daily before breakfast. 30 tablet 9   escitalopram (LEXAPRO) 5 MG tablet Take 1 tablet (5 mg total) by mouth at bedtime. 90 tablet 1   esomeprazole (NEXIUM) 40 MG capsule TAKE ONE CAPSULE ONCE DAILY 90 capsule 1   Homeopathic Products (ARNICARE) GEL Apply 1 application topically daily as needed (skin bruising).     hyoscyamine (LEVSIN) 0.125 MG tablet TAKE 1-2 TABLETS (0.125-0.25 MG) BY MOUTH EVERY 4 HOURS AS NEEDED FOR UP TO 10 DAYS FOR CRAMPING. (Patient taking differently: Take 0.125-0.25 mg by mouth every 4 (four) hours as needed  for cramping.) 100 tablet 1   LORazepam (ATIVAN) 1 MG tablet TAKE ONE TABLET BY MOUTH TWICE DAILY AS NEEDED FOR ANXIETY / SLEEP. 180 tablet 1   losartan (COZAAR) 25 MG tablet Take 0.5 tablets (12.5 mg total) by mouth daily. 45 tablet 3   meclizine (ANTIVERT) 12.5 MG tablet Take 1 tablet (12.5 mg total) by mouth 3 (three) times daily as needed for dizziness. 60 tablet 1   metoprolol succinate (TOPROL-XL) 25 MG 24 hr tablet Take 0.5 tablets (12.5 mg total) by mouth daily. 45 tablet 3   nitroGLYCERIN (NITROSTAT) 0.4 MG SL tablet Place 1 tablet (0.4 mg total) under the tongue every 5 (five) minutes as needed for chest pain (Call 911 at 3rd dose within 15 minutes.). 20 tablet 3   ondansetron (ZOFRAN-ODT) 8 MG disintegrating tablet Take 1 tablet (8 mg total) by mouth every 8 (eight) hours as needed for nausea or vomiting. 12 tablet 3   Polyethyl Glycol-Propyl Glycol (LUBRICANT EYE DROPS) 0.4-0.3 % SOLN Place 1-2 drops into both eyes at bedtime.     rifaximin (XIFAXAN) 550 MG TABS tablet Take 1 tablet (550 mg total) by mouth 3 (three) times daily. 42 tablet 0   sodium chloride (OCEAN) 0.65 % SOLN nasal spray Place 1 spray into both nostrils at bedtime as needed for congestion.     No current facility-administered medications for this visit.     ALLERGIES: Macrobid [nitrofurantoin monohyd macro], Meloxicam, Digoxin and related, Diprolene [betamethasone dipropionate aug], Ferrous sulfate, Keflex [cephalexin], Lipitor [atorvastatin], Mobic [meloxicam], and Sulfamethoxazole-trimethoprim  Family History  Problem Relation Age of Onset   Colon cancer Mother        Dx age 49, died at age 2   Diabetes Father    Prostate cancer Father    Prostate cancer Brother    Pancreatic cancer Brother    Stomach cancer Son     Social History   Socioeconomic History   Marital status: Married    Spouse name: [name]   Number of children: 2   Years of education: Not on file   Highest education level: Bachelor's  degree (e.g., BA, AB, BS)  Occupational History   Occupation: Travel  Agent    Employer: RETIRED  Tobacco Use   Smoking status: Former    Packs/day: 0.25    Years: 28.00    Additional pack years: 0.00    Total pack years: 7.00    Types: Cigarettes    Quit date: 17    Years since quitting: 43.4   Smokeless tobacco: Never  Vaping Use   Vaping Use: Never used  Substance and Sexual Activity   Alcohol use: Yes    Comment: 7 vodka drinks a week   Drug use: Never   Sexual activity: Not Currently    Birth control/protection: Surgical    Comment: hysterectomy  Other Topics Concern   Not on file  Social History Narrative   Regular Exercise -  YES         Social Determinants of Health   Financial Resource Strain: Low Risk  (12/29/2022)   Overall Financial Resource Strain (CARDIA)    Difficulty of Paying Living Expenses: Not hard at all  Food Insecurity: No Food Insecurity (12/29/2022)   Hunger Vital Sign    Worried About Running Out of Food in the Last Year: Never true    Ran Out of Food in the Last Year: Never true  Transportation Needs: No Transportation Needs (12/29/2022)   PRAPARE - Administrator, Civil Service (Medical): No    Lack of Transportation (Non-Medical): No  Physical Activity: Insufficiently Active (12/29/2022)   Exercise Vital Sign    Days of Exercise per Week: 3 days    Minutes of Exercise per Session: 40 min  Stress: Stress Concern Present (12/29/2022)   [employer] of Occupational Health - Occupational Stress Questionnaire    Feeling of Stress : To some extent  Social Connections: Moderately Integrated (12/29/2022)   Social Connection and Isolation Panel [NHANES]    Frequency of Communication with Friends and Family: More than three times a week    Frequency of Social Gatherings with Friends and Family: More than three times a week    Attends Religious Services: Never    Database administrator or Organizations: Yes    Attends Museum/gallery exhibitions officer: More than 4 times per year    Marital Status: Married  Catering manager Violence: Not At Risk (02/14/2022)   Humiliation, Afraid, Rape, and Kick questionnaire    Fear of Current or Ex-Partner: No    Emotionally Abused: No    Physically Abused: No    Sexually Abused: No    Review of Systems  All other systems reviewed and are negative.   PHYSICAL EXAMINATION:    There were no vitals taken for this visit.    General appearance: alert, cooperative and appears stated age Head: Normocephalic, without obvious abnormality, atraumatic Neck: no adenopathy, supple, symmetrical, trachea midline and thyroid normal to inspection and palpation Lungs: clear to auscultation bilaterally Breasts: normal appearance, no masses or tenderness, No nipple retraction or dimpling, No nipple discharge or bleeding, No axillary or supraclavicular adenopathy Heart: regular rate and rhythm Abdomen: soft, non-tender, no masses,  no organomegaly Extremities: extremities normal, atraumatic, no cyanosis or edema Skin: Skin color, texture, turgor normal. No rashes or lesions Lymph nodes: Cervical, supraclavicular, and axillary nodes normal. No abnormal inguinal nodes palpated Neurologic: Grossly normal  Pelvic: External genitalia:  no lesions              Urethra:  normal appearing urethra with no masses, tenderness or lesions  Bartholins and Skenes: normal                 Vagina: normal appearing vagina with normal color and discharge, no lesions              Cervix: no lesions                Bimanual Exam:  Uterus:  normal size, contour, position, consistency, mobility, non-tender              Adnexa: no mass, fullness, tenderness              Rectal exam: {yes no:[id number]}.  Confirms.              Anus:  normal sphincter tone, no lesions  Chaperone was present for exam:  ***  ASSESSMENT     PLAN     An After Visit Summary was printed and given to the  patient.  ______ minutes face to face time of which over 50% was spent in counseling.

## 2023-01-31 NOTE — Telephone Encounter (Signed)
FYI. Pt also LVM in triage line on 01/30/2023. Requesting a phone call from Dr. Edward Jolly versus making another appt since having to leave office without being seen due to long wait time. Please advise.

## 2023-01-31 NOTE — Telephone Encounter (Signed)
I would recommend a trial of gabapentin 6% cream in a neutral base.  Apply to the vulvar area tid prn.  Disp:  30 grams RF:  none.  This is a medication that treats pain/discomfort.   It alters how the body perceives pain messages.   I would expect side effects to be minimal as it is a cream form.  Oral gabapentin can cause dizziness and sleepiness   It will not resolve the skin changes in the vulvar area.   This will need to be sent to a compounding pharmacy.   I think that the best chance she has at resolving the ongoing irritation is with surgical excision.   I am sorry that I was behind schedule yesterday and that she needed to leave before being seen.   I will be out of the office until June 3.   I recommend a recheck appointment with me in 4 weeks.

## 2023-02-01 ENCOUNTER — Encounter: Payer: Self-pay | Admitting: Internal Medicine

## 2023-02-01 ENCOUNTER — Ambulatory Visit: Payer: Medicare Other

## 2023-02-01 MED ORDER — NONFORMULARY OR COMPOUNDED ITEM: 0 refills | Status: DC

## 2023-02-01 NOTE — Telephone Encounter (Signed)
Spoke with patient. Advised per Dr. Edward Jolly.  Patient agreeable to try Rx, Rx to [facility]. Patient aware to allow at least 48 hours for medication to be prepared, f/u with pharmacy.   Patient will be traveling 6/15 -9/2.  OV scheduled for 6/12 at at 12pm with Dr. Edward Jolly.  AEX scheduled for  05/27/23 at 1200 with Dr. Edward Jolly.  Patient verbalizes understanding and is agreeable.   Rx entered in [facility],  Call placed to [facility], verbal order left on provider line for prescriptions with instructions to return call to [facility] [phone], opt 4 if any additional questions.   Routing to provider for final review. Patient is agreeable to disposition. Will close encounter.

## 2023-02-05 MED ORDER — METRONIDAZOLE 250 MG PO TABS: 250.0000 mg | ORAL_TABLET | Freq: Three times a day (TID) | ORAL | 0 refills | Status: DC

## 2023-02-06 ENCOUNTER — Ambulatory Visit: Payer: Medicare Other | Admitting: Radiology

## 2023-02-06 NOTE — Progress Notes (Deleted)
GYNECOLOGY  VISIT   HPI: 88 y.o.   Married  Caucasian  female   G2P2001 with No LMP recorded. Patient has had a hysterectomy.   here for   med f/u  GYNECOLOGIC HISTORY: No LMP recorded. Patient has had a hysterectomy. Contraception:  hysterectomy Menopausal hormone therapy:  n/a Last mammogram:  02/08/22 Breast Density Category C, BI-RADS CATEGORY 1 Neg  Last pap smear:   many years ago         OB History     Gravida  2   Para  2   Term  2   Preterm      AB      Living  1      SAB      IAB      Ectopic      Multiple      Live Births  2              Patient Active Problem List   Diagnosis Date Noted   Statin intolerance 12/31/2022   Acute pain of right knee 12/06/2022   Impetigo 10/01/2022   Insomnia 10/01/2022   Osteoporosis 06/11/2022   Hematoma of right thigh 04/25/2022   Weakness 04/25/2022   Weakness generalized 04/25/2022   Falls frequently 04/19/2022   Iron deficiency anemia 12/08/2021   Skin ulcer, limited to breakdown of skin (HCC) 11/22/2021   Anemia, iron deficiency 11/22/2021   Low serum vitamin B12 11/22/2021   Acute renal failure superimposed on stage 2 chronic kidney disease (HCC) 11/20/2021   ABLA (acute blood loss anemia) 11/20/2021   Hypotension 11/19/2021   Shortness of breath 06/12/2021   Right leg pain 02/21/2021   Abnormal CT of the abdomen    Aortic atherosclerosis (HCC) 08/27/2020   Acute prerenal azotemia 08/27/2020   Shock circulatory (HCC) 08/27/2020   Metabolic acidosis with normal anion gap and bicarbonate losses 08/27/2020   Abdominal cramps 03/16/2020   Neurogenic orthostatic hypotension (HCC) 09/16/2019   Greater trochanteric pain syndrome of right lower extremity 07/30/2019   Bradycardia with 41-50 beats per minute 12/27/2017   Right bundle branch block (RBBB) on electrocardiogram (ECG) 12/27/2017   Chronic kidney disease, stage 3b (HCC) 12/24/2017   Cellulitis of leg, right with large prepatella hematoma and  open wounds 12/24/2017   Iliotibial band syndrome of right side 12/16/2017   Intervertebral lumbar disc disorder with myelopathy, lumbar region 04/25/2017   Family history of colon cancer in mother 01/22/2017   Primary osteoarthritis of both first carpometacarpal joints 01/02/2017   Pain 11/21/2016   Gastroenteritis 09/13/2016   Chronic anticoagulation 05/24/2015   Coronary artery disease involving native coronary artery of native heart with angina pectoris (HCC) 12/20/2014   Permanent atrial fibrillation (HCC) 11/21/2012   Greater trochanteric bursitis of both hips 05/21/2012   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   Osteoarthritis of left hip 07/03/2010   DEGENERATIVE DISC DISEASE, LUMBOSACRAL SPINE 05/18/2010   SYNCOPE 10/27/2008   Essential hypertension 06/16/2007   GERD (gastroesophageal reflux disease) 06/16/2007    Past Medical History:  Diagnosis Date   Acute blood loss anemia 12/25/2017   Allergic rhinitis    Anxiety    Arthritis    "my whole spine" (07/01/2017)   Atrial fibrillation (HCC)    Bowel obstruction (HCC)    in [place]   Bradycardia with 41-50 beats per minute 12/27/2017   Cancer (HCC)    Cellulitis of leg, right with large prepatella hematoma and open wounds 12/26/2017   CKD (chronic  kidney disease) stage 2, GFR 60-89 ml/min    Colon polyps    Coronary artery disease    10/18 PCI/DES to p/m LCx with cutting balloon to mLcx   Diverticulosis of colon    GERD (gastroesophageal reflux disease)    Hip bursitis 2010   Dr Wyline Mood, Post op seroma   History of colon polyps    HSV (herpes simplex virus) anogenital infection 07/2019   HTN (hypertension)    IBS (irritable bowel syndrome)    constipation predominant - Dr Kinnie Scales   Lichen sclerosus    Osteopenia 11/2016   T score -2.0 FRAX 15%/4.3%   PAC (premature atrial contraction)    Symptomatiic   Renal insufficiency    Right bundle branch block (RBBB) on electrocardiogram (ECG) 12/27/2017   Scoliosis     SVT (supraventricular tachycardia)    brief history   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   biopsy showing vulvar atypia, possible VIN I    Past Surgical History:  Procedure Laterality Date   ANTERIOR AND POSTERIOR VAGINAL REPAIR  01/2002   Hattie Perch 01/23/2011   APPENDECTOMY  1948   CARDIAC CATHETERIZATION  06/26/2017   CORONARY ANGIOPLASTY WITH STENT PLACEMENT  07/01/2017   CORONARY ATHERECTOMY N/A 07/01/2017   Procedure: CORONARY ATHERECTOMY;  Surgeon: Lyn Records, MD;  Location: [facility] INVASIVE CV [facility];  Service: Cardiovascular;  Laterality: N/A;   CORONARY STENT INTERVENTION N/A 07/01/2017   Procedure: CORONARY STENT INTERVENTION;  Surgeon: Lyn Records, MD;  Location: [facility];  Service: Cardiovascular;  Laterality: N/A;   HAMMER TOE SURGERY     HEMORRHOID BANDING     HIP SURGERY Left 04/2009   hip examination under anesthesia followed by greater trochanteric bursectomy; iliotibial band tenotomy/notes 01/20/2011   I & D EXTREMITY Right 01/10/2018   Procedure: IRRIGATION AND DEBRIDEMENT RIGHT KNEE, APPLY WOUND VAC;  Surgeon: Nadara Mustard, MD;  Location: [facility] OR;  Service: Orthopedics;  Laterality: Right;   KNEE BURSECTOMY Right 04/2009   Hattie Perch 01/09/2011   LEFT ATRIAL APPENDAGE OCCLUSION N/A 08/24/2021   Procedure: LEFT ATRIAL APPENDAGE OCCLUSION;  Surgeon: Tonny Bollman, MD;  Location: [facility] INVASIVE CV LAB;  Service: Cardiovascular;  Laterality: N/A;   LEFT HEART CATH AND CORONARY ANGIOGRAPHY N/A 06/26/2017   Procedure: LEFT HEART CATH AND CORONARY ANGIOGRAPHY;  Surgeon: Lyn Records, MD;  Location: [facility];  Service: Cardiovascular;  Laterality: N/A;   PUBOVAGINAL SLING  01/2002   Hattie Perch 01/23/2011   REDUCTION MAMMAPLASTY     TEE WITHOUT CARDIOVERSION N/A 08/24/2021   Procedure: TRANSESOPHAGEAL ECHOCARDIOGRAM (TEE);  Surgeon: Tonny Bollman, MD;  Location[facility] INVASIVE CV LAB;  Service: Cardiovascular;  Laterality: N/A;   TEMPORARY PACEMAKER N/A  07/01/2017   Procedure: TEMPORARY PACEMAKER;  Surgeon: Lyn Records, MD;  Location: [facility] INVASIVE CV LAB;  Service: Cardiovascular;  Laterality: N/A;   VAGINAL HYSTERECTOMY  01/2002   Vaginal hysterectomy, bilateral salpingo-oophorectomy/notes 01/23/2011    Current Outpatient Medications  Medication Sig Dispense Refill   acetaminophen (TYLENOL) 500 MG tablet Take 1,000 mg by mouth every 6 (six) hours as needed (body aches / sore hip). Maximum of 6 tablets daily (3000mg )     apixaban (ELIQUIS) 2.5 MG TABS tablet Take 1 tablet (2.5 mg total) by mouth 2 (two) times daily. 200 tablet 2   BIOTIN PO Take 5,000 mcg by mouth in the morning.     Cholecalciferol (VITAMIN D3) 50 MCG (2000 UT) capsule Take 1 capsule (2,000 Units  total) by mouth daily. 100 capsule 3   Cyanocobalamin (VITAMIN B-12) 1000 MCG SUBL Place 1 tablet (1,000 mcg total) under the tongue daily. 100 tablet 3   diclofenac Sodium (VOLTAREN) 1 % GEL Apply 2 g topically 4 (four) times daily as needed (back pain.).     dicyclomine (BENTYL) 20 MG tablet Take 1 by mouth every 4-6 hours as needed for cramping (Patient taking differently: Take 20 mg by mouth 4 (four) times daily -  before meals and at bedtime. Take 1 by mouth every 4-6 hours as needed for cramping) 30 tablet 3   empagliflozin (JARDIANCE) 10 MG TABS tablet Take 1 tablet (10 mg total) by mouth daily before breakfast. 30 tablet 9   escitalopram (LEXAPRO) 5 MG tablet Take 1 tablet (5 mg total) by mouth at bedtime. 90 tablet 1   esomeprazole (NEXIUM) 40 MG capsule TAKE ONE CAPSULE ONCE DAILY 90 capsule 1   Homeopathic Products (ARNICARE) GEL Apply 1 application topically daily as needed (skin bruising).     hyoscyamine (LEVSIN) 0.125 MG tablet TAKE 1-2 TABLETS (0.125-0.25 MG) BY MOUTH EVERY 4 HOURS AS NEEDED FOR UP TO 10 DAYS FOR CRAMPING. (Patient taking differently: Take 0.125-0.25 mg by mouth every 4 (four) hours as needed for cramping.) 100 tablet 1   LORazepam (ATIVAN) 1 MG tablet  TAKE ONE TABLET BY MOUTH TWICE DAILY AS NEEDED FOR ANXIETY / SLEEP. 180 tablet 1   losartan (COZAAR) 25 MG tablet Take 0.5 tablets (12.5 mg total) by mouth daily. 45 tablet 3   meclizine (ANTIVERT) 12.5 MG tablet Take 1 tablet (12.5 mg total) by mouth 3 (three) times daily as needed for dizziness. 60 tablet 1   metoprolol succinate (TOPROL-XL) 25 MG 24 hr tablet Take 0.5 tablets (12.5 mg total) by mouth daily. 45 tablet 3   metroNIDAZOLE (FLAGYL) 250 MG tablet Take 1 tablet (250 mg total) by mouth 3 (three) times daily for 14 days. 42 tablet 0   nitroGLYCERIN (NITROSTAT) 0.4 MG SL tablet Place 1 tablet (0.4 mg total) under the tongue every 5 (five) minutes as needed for chest pain (Call 911 at 3rd dose within 15 minutes.). 20 tablet 3   NONFORMULARY OR COMPOUNDED ITEM I would recommend a trial of gabapentin 6% cream in a neutral base.  Apply to the vulvar area tid prn.  Disp:  30 grams RF:  none. 1 each 0   ondansetron (ZOFRAN-ODT) 8 MG disintegrating tablet Take 1 tablet (8 mg total) by mouth every 8 (eight) hours as needed for nausea or vomiting. 12 tablet 3   Polyethyl Glycol-Propyl Glycol (LUBRICANT EYE DROPS) 0.4-0.3 % SOLN Place 1-2 drops into both eyes at bedtime.     sodium chloride (OCEAN) 0.65 % SOLN nasal spray Place 1 spray into both nostrils at bedtime as needed for congestion.     No current facility-administered medications for this visit.     ALLERGIES: Macrobid [nitrofurantoin monohyd macro], Meloxicam, Digoxin and related, Diprolene [betamethasone dipropionate aug], Ferrous sulfate, Keflex [cephalexin], Lipitor [atorvastatin], Mobic [meloxicam], and Sulfamethoxazole-trimethoprim  Family History  Problem Relation Age of Onset   Colon cancer Mother        Dx age 62, died at age 35   Diabetes Father    Prostate cancer Father    Prostate cancer Brother    Pancreatic cancer Brother    Stomach cancer Son     Social History   Socioeconomic History   Marital status: Married     Spouse name: Rudell Cobb  Number of children: 2   Years of education: Not on file   Highest education level: Bachelor's degree (e.g., BA, AB, BS)  Occupational History   Occupation: Engineer, manufacturing systems: RETIRED  Tobacco Use   Smoking status: Former    Packs/day: 0.25    Years: 28.00    Additional pack years: 0.00    Total pack years: 7.00    Types: Cigarettes    Quit date: 1981    Years since quitting: 43.4   Smokeless tobacco: Never  Vaping Use   Vaping Use: Never used  Substance and Sexual Activity   Alcohol use: Yes    Comment: 7 vodka drinks a week   Drug use: Never   Sexual activity: Not Currently    Birth control/protection: Surgical    Comment: hysterectomy  Other Topics Concern   Not on file  Social History Narrative   Regular Exercise -  YES         Social Determinants of Health   Financial Resource Strain: Low Risk  (12/29/2022)   Overall Financial Resource Strain (CARDIA)    Difficulty of Paying Living Expenses: Not hard at all  Food Insecurity: No Food Insecurity (12/29/2022)   Hunger Vital Sign    Worried About Running Out of Food in the Last Year: Never true    Ran Out of Food in the Last Year: Never true  Transportation Needs: No Transportation Needs (12/29/2022)   PRAPARE - Administrator, Civil Service (Medical): No    Lack of Transportation (Non-Medical): No  Physical Activity: Insufficiently Active (12/29/2022)   Exercise Vital Sign    Days of Exercise per Week: 3 days    Minutes of Exercise per Session: 40 min  Stress: Stress Concern Present (12/29/2022)   [employer] of Occupational Health - Occupational Stress Questionnaire    Feeling of Stress : To some extent  Social Connections: Moderately Integrated (12/29/2022)   Social Connection and Isolation Panel [NHANES]    Frequency of Communication with Friends and Family: More than three times a week    Frequency of Social Gatherings with Friends and Family: More than three  times a week    Attends Religious Services: Never    Database administrator or Organizations: Yes    Attends Engineer, structural: More than 4 times per year    Marital Status: Married  Catering manager Violence: Not At Risk (02/14/2022)   Humiliation, Afraid, Rape, and Kick questionnaire    Fear of Current or Ex-Partner: No    Emotionally Abused: No    Physically Abused: No    Sexually Abused: No    Review of Systems  PHYSICAL EXAMINATION:    There were no vitals taken for this visit.    General appearance: alert, cooperative and appears stated age Head: Normocephalic, without obvious abnormality, atraumatic Neck: no adenopathy, supple, symmetrical, trachea midline and thyroid normal to inspection and palpation Lungs: clear to auscultation bilaterally Breasts: normal appearance, no masses or tenderness, No nipple retraction or dimpling, No nipple discharge or bleeding, No axillary or supraclavicular adenopathy Heart: regular rate and rhythm Abdomen: soft, non-tender, no masses,  no organomegaly Extremities: extremities normal, atraumatic, no cyanosis or edema Skin: Skin color, texture, turgor normal. No rashes or lesions Lymph nodes: Cervical, supraclavicular, and axillary nodes normal. No abnormal inguinal nodes palpated Neurologic: Grossly normal  Pelvic: External genitalia:  no lesions  Urethra:  normal appearing urethra with no masses, tenderness or lesions              Bartholins and Skenes: normal                 Vagina: normal appearing vagina with normal color and discharge, no lesions              Cervix: no lesions                Bimanual Exam:  Uterus:  normal size, contour, position, consistency, mobility, non-tender              Adnexa: no mass, fullness, tenderness              Rectal exam: {yes no:[id number]}.  Confirms.              Anus:  normal sphincter tone, no lesions  Chaperone was present for exam:  ***  ASSESSMENT     PLAN      An After Visit Summary was printed and given to the patient.  ______ minutes face to face time of which over 50% was spent in counseling.

## 2023-02-07 ENCOUNTER — Other Ambulatory Visit: Payer: Self-pay | Admitting: Cardiology

## 2023-02-07 ENCOUNTER — Emergency Department (HOSPITAL_COMMUNITY): Payer: Medicare Other

## 2023-02-07 ENCOUNTER — Ambulatory Visit: Payer: Medicare Other | Admitting: Nurse Practitioner

## 2023-02-07 ENCOUNTER — Encounter (HOSPITAL_COMMUNITY): Payer: Self-pay

## 2023-02-07 ENCOUNTER — Observation Stay (INDEPENDENT_AMBULATORY_CARE_PROVIDER_SITE_OTHER): Payer: Medicare Other

## 2023-02-07 ENCOUNTER — Observation Stay (HOSPITAL_COMMUNITY)
Admission: EM | Admit: 2023-02-07 | Discharge: 2023-02-09 | Disposition: A | Discharge status: Home Health | Payer: Medicare Other | Attending: Internal Medicine | Admitting: Internal Medicine

## 2023-02-07 DIAGNOSIS — R001 Bradycardia, unspecified: Secondary | ICD-10-CM

## 2023-02-07 DIAGNOSIS — Z79899 Other long term (current) drug therapy: Secondary | ICD-10-CM | POA: Insufficient documentation

## 2023-02-07 DIAGNOSIS — I959 Hypotension, unspecified: Secondary | ICD-10-CM | POA: Diagnosis not present

## 2023-02-07 DIAGNOSIS — I9589 Other hypotension: Secondary | ICD-10-CM | POA: Diagnosis not present

## 2023-02-07 DIAGNOSIS — I5032 Chronic diastolic (congestive) heart failure: Secondary | ICD-10-CM | POA: Insufficient documentation

## 2023-02-07 DIAGNOSIS — R911 Solitary pulmonary nodule: Secondary | ICD-10-CM | POA: Insufficient documentation

## 2023-02-07 DIAGNOSIS — I7121 Aneurysm of the ascending aorta, without rupture: Secondary | ICD-10-CM | POA: Diagnosis not present

## 2023-02-07 DIAGNOSIS — Z743 Need for continuous supervision: Secondary | ICD-10-CM | POA: Diagnosis not present

## 2023-02-07 DIAGNOSIS — W19XXXA Unspecified fall, initial encounter: Secondary | ICD-10-CM | POA: Diagnosis not present

## 2023-02-07 DIAGNOSIS — Z043 Encounter for examination and observation following other accident: Secondary | ICD-10-CM | POA: Diagnosis not present

## 2023-02-07 DIAGNOSIS — D649 Anemia, unspecified: Secondary | ICD-10-CM | POA: Insufficient documentation

## 2023-02-07 DIAGNOSIS — I700 Atherosclerosis of aorta: Secondary | ICD-10-CM | POA: Diagnosis not present

## 2023-02-07 DIAGNOSIS — Y92009 Unspecified place in unspecified non-institutional (private) residence as the place of occurrence of the external cause: Secondary | ICD-10-CM | POA: Insufficient documentation

## 2023-02-07 DIAGNOSIS — S42212A Unspecified displaced fracture of surgical neck of left humerus, initial encounter for closed fracture: Secondary | ICD-10-CM | POA: Diagnosis not present

## 2023-02-07 DIAGNOSIS — I4821 Permanent atrial fibrillation: Secondary | ICD-10-CM | POA: Diagnosis not present

## 2023-02-07 DIAGNOSIS — Z7901 Long term (current) use of anticoagulants: Secondary | ICD-10-CM | POA: Insufficient documentation

## 2023-02-07 DIAGNOSIS — S0990XA Unspecified injury of head, initial encounter: Secondary | ICD-10-CM | POA: Diagnosis not present

## 2023-02-07 DIAGNOSIS — R6889 Other general symptoms and signs: Secondary | ICD-10-CM | POA: Diagnosis not present

## 2023-02-07 DIAGNOSIS — R0902 Hypoxemia: Secondary | ICD-10-CM | POA: Diagnosis not present

## 2023-02-07 DIAGNOSIS — I482 Chronic atrial fibrillation, unspecified: Secondary | ICD-10-CM

## 2023-02-07 DIAGNOSIS — N1831 Chronic kidney disease, stage 3a: Secondary | ICD-10-CM | POA: Diagnosis not present

## 2023-02-07 DIAGNOSIS — W010XXA Fall on same level from slipping, tripping and stumbling without subsequent striking against object, initial encounter: Secondary | ICD-10-CM | POA: Insufficient documentation

## 2023-02-07 DIAGNOSIS — S42215A Unspecified nondisplaced fracture of surgical neck of left humerus, initial encounter for closed fracture: Secondary | ICD-10-CM

## 2023-02-07 DIAGNOSIS — J9811 Atelectasis: Secondary | ICD-10-CM | POA: Diagnosis not present

## 2023-02-07 DIAGNOSIS — I130 Hypertensive heart and chronic kidney disease with heart failure and stage 1 through stage 4 chronic kidney disease, or unspecified chronic kidney disease: Secondary | ICD-10-CM | POA: Insufficient documentation

## 2023-02-07 DIAGNOSIS — I110 Hypertensive heart disease with heart failure: Secondary | ICD-10-CM | POA: Insufficient documentation

## 2023-02-07 DIAGNOSIS — E1140 Type 2 diabetes mellitus with diabetic neuropathy, unspecified: Secondary | ICD-10-CM | POA: Insufficient documentation

## 2023-02-07 DIAGNOSIS — S42214A Unspecified nondisplaced fracture of surgical neck of right humerus, initial encounter for closed fracture: Principal | ICD-10-CM | POA: Insufficient documentation

## 2023-02-07 DIAGNOSIS — E872 Acidosis, unspecified: Secondary | ICD-10-CM | POA: Diagnosis not present

## 2023-02-07 DIAGNOSIS — S42352A Displaced comminuted fracture of shaft of humerus, left arm, initial encounter for closed fracture: Secondary | ICD-10-CM | POA: Insufficient documentation

## 2023-02-07 DIAGNOSIS — I499 Cardiac arrhythmia, unspecified: Secondary | ICD-10-CM | POA: Diagnosis not present

## 2023-02-07 DIAGNOSIS — M25512 Pain in left shoulder: Secondary | ICD-10-CM | POA: Diagnosis present

## 2023-02-07 DIAGNOSIS — S199XXA Unspecified injury of neck, initial encounter: Secondary | ICD-10-CM | POA: Diagnosis not present

## 2023-02-07 DIAGNOSIS — S42302A Unspecified fracture of shaft of humerus, left arm, initial encounter for closed fracture: Secondary | ICD-10-CM | POA: Diagnosis not present

## 2023-02-07 HISTORY — DX: Unspecified osteoarthritis, unspecified site: M19.90

## 2023-02-07 HISTORY — DX: Unspecified atrial fibrillation: I48.91

## 2023-02-07 LAB — COMPREHENSIVE METABOLIC PANEL
ALT: 12 U/L (ref 0–44)
AST: 22 U/L (ref 15–41)
Albumin: 3 g/dL — ABNORMAL LOW (ref 3.5–5.0)
Alkaline Phosphatase: 46 U/L (ref 38–126)
Anion gap: 7 (ref 5–15)
BUN: 24 mg/dL — ABNORMAL HIGH (ref 8–23)
CO2: 20 mmol/L — ABNORMAL LOW (ref 22–32)
Calcium: 8.3 mg/dL — ABNORMAL LOW (ref 8.9–10.3)
Chloride: 108 mmol/L (ref 98–111)
Creatinine, Ser: 1.23 mg/dL — ABNORMAL HIGH (ref 0.44–1.00)
GFR, Estimated: 43 mL/min — ABNORMAL LOW (ref >60)
Glucose, Bld: 144 mg/dL — ABNORMAL HIGH (ref 70–99)
Potassium: 4.1 mmol/L (ref 3.5–5.1)
Sodium: 135 mmol/L (ref 135–145)
Total Bilirubin: 0.6 mg/dL (ref 0.3–1.2)
Total Protein: 5.3 g/dL — ABNORMAL LOW (ref 6.5–8.1)

## 2023-02-07 LAB — URINALYSIS, ROUTINE W REFLEX MICROSCOPIC
Bilirubin Urine: NEGATIVE
Glucose, UA: >=500 mg/dL — AB
Hgb urine dipstick: NEGATIVE
Ketones, ur: NEGATIVE mg/dL
Nitrite: NEGATIVE
Protein, ur: NEGATIVE mg/dL
Specific Gravity, Urine: 1.036 — ABNORMAL HIGH (ref 1.005–1.030)
pH: 5 (ref 5.0–8.0)

## 2023-02-07 LAB — I-STAT CHEM 8, ED
BUN: 25 mg/dL — ABNORMAL HIGH (ref 8–23)
Calcium, Ion: 1.15 mmol/L (ref 1.15–1.40)
Chloride: 105 mmol/L (ref 98–111)
Creatinine, Ser: 1.2 mg/dL — ABNORMAL HIGH (ref 0.44–1.00)
Glucose, Bld: 140 mg/dL — ABNORMAL HIGH (ref 70–99)
HCT: 32 % — ABNORMAL LOW (ref 36.0–46.0)
Hemoglobin: 10.9 g/dL — ABNORMAL LOW (ref 12.0–15.0)
Potassium: 4.2 mmol/L (ref 3.5–5.1)
Sodium: 136 mmol/L (ref 135–145)
TCO2: 22 mmol/L (ref 22–32)

## 2023-02-07 LAB — SAMPLE TO BLOOD BANK

## 2023-02-07 LAB — LACTIC ACID, PLASMA
Lactic Acid, Venous: 2.2 mmol/L (ref 0.5–1.9)
Lactic Acid, Venous: 1.3 mmol/L (ref 0.5–1.9)
Lactic Acid, Venous: 1.7 mmol/L (ref 0.5–1.9)

## 2023-02-07 LAB — CBC
HCT: 33.2 % — ABNORMAL LOW (ref 36.0–46.0)
Hemoglobin: 11.2 g/dL — ABNORMAL LOW (ref 12.0–15.0)
MCH: 33.2 pg (ref 26.0–34.0)
MCHC: 33.7 g/dL (ref 30.0–36.0)
MCV: 98.5 fL (ref 80.0–100.0)
Platelets: 179 10*3/uL (ref 150–400)
RBC: 3.37 MIL/uL — ABNORMAL LOW (ref 3.87–5.11)
RDW: 14.1 % (ref 11.5–15.5)
WBC: 4.9 10*3/uL (ref 4.0–10.5)
nRBC: 0 % (ref 0.0–0.2)

## 2023-02-07 LAB — PROTIME-INR
INR: 1.2 (ref 0.8–1.2)
Prothrombin Time: 15.3 seconds — ABNORMAL HIGH (ref 11.4–15.2)

## 2023-02-07 LAB — ETHANOL: Alcohol, Ethyl (B): <10 mg/dL (ref <10)

## 2023-02-07 LAB — TSH: TSH: 1.685 u[IU]/mL (ref 0.350–4.500)

## 2023-02-07 MED ORDER — DULOXETINE HCL 20 MG PO CPEP
20.0000 mg | ORAL_CAPSULE | Freq: Every day | ORAL | Status: DC
  Filled 2023-02-07: qty 1

## 2023-02-07 MED ORDER — OXYCODONE-ACETAMINOPHEN 5-325 MG PO TABS
1.0000 | ORAL_TABLET | Freq: Once | ORAL | Status: AC
  Administered 2023-02-07: 1 via ORAL
  Filled 2023-02-07: qty 1

## 2023-02-07 MED ORDER — SENNOSIDES-DOCUSATE SODIUM 8.6-50 MG PO TABS
1.0000 | ORAL_TABLET | Freq: Every evening | ORAL | Status: DC | PRN
  Administered 2023-02-08: 1 via ORAL
  Filled 2023-02-07: qty 1

## 2023-02-07 MED ORDER — LORAZEPAM 1 MG PO TABS
0.5000 mg | ORAL_TABLET | Freq: Two times a day (BID) | ORAL | Status: DC | PRN
  Administered 2023-02-07: 0.5 mg via ORAL
  Filled 2023-02-07: qty 1

## 2023-02-07 MED ORDER — PANTOPRAZOLE SODIUM 40 MG PO TBEC
40.0000 mg | DELAYED_RELEASE_TABLET | Freq: Every day | ORAL | Status: DC
  Administered 2023-02-08 – 2023-02-09 (×2): 40 mg via ORAL
  Filled 2023-02-07 (×2): qty 1

## 2023-02-07 MED ORDER — EMPAGLIFLOZIN 10 MG PO TABS
10.0000 mg | ORAL_TABLET | Freq: Every day | ORAL | Status: DC
  Administered 2023-02-08 – 2023-02-09 (×2): 10 mg via ORAL
  Filled 2023-02-07 (×2): qty 1

## 2023-02-07 MED ORDER — APIXABAN 2.5 MG PO TABS
2.5000 mg | ORAL_TABLET | Freq: Two times a day (BID) | ORAL | Status: DC
  Administered 2023-02-07 – 2023-02-09 (×4): 2.5 mg via ORAL
  Filled 2023-02-07 (×4): qty 1

## 2023-02-07 MED ORDER — SODIUM CHLORIDE 0.9% FLUSH
3.0000 mL | Freq: Two times a day (BID) | INTRAVENOUS | Status: DC
  Administered 2023-02-07 – 2023-02-09 (×5): 3 mL via INTRAVENOUS

## 2023-02-07 MED ORDER — ATORVASTATIN CALCIUM 40 MG PO TABS
40.0000 mg | ORAL_TABLET | ORAL | Status: DC
  Filled 2023-02-07: qty 1

## 2023-02-07 MED ORDER — FENTANYL CITRATE PF 50 MCG/ML IJ SOSY
50.0000 ug | PREFILLED_SYRINGE | Freq: Once | INTRAMUSCULAR | Status: AC
  Administered 2023-02-07: 50 ug via INTRAVENOUS
  Filled 2023-02-07: qty 1

## 2023-02-07 MED ORDER — MECLIZINE HCL 25 MG PO TABS: 25.0000 mg | ORAL_TABLET | Freq: Three times a day (TID) | ORAL | Status: DC | PRN

## 2023-02-07 MED ORDER — LABETALOL HCL 5 MG/ML IV SOLN: 10.0000 mg | Freq: Four times a day (QID) | INTRAVENOUS | Status: DC | PRN

## 2023-02-07 MED ORDER — BISACODYL 5 MG PO TBEC: 5.0000 mg | DELAYED_RELEASE_TABLET | Freq: Every day | ORAL | Status: DC | PRN

## 2023-02-07 MED ORDER — ESCITALOPRAM OXALATE 10 MG PO TABS
5.0000 mg | ORAL_TABLET | Freq: Every day | ORAL | Status: DC
  Filled 2023-02-07: qty 1

## 2023-02-07 MED ORDER — OXYCODONE HCL 5 MG PO TABS
5.0000 mg | ORAL_TABLET | Freq: Four times a day (QID) | ORAL | Status: DC | PRN
  Administered 2023-02-08 – 2023-02-09 (×5): 5 mg via ORAL
  Filled 2023-02-07 (×6): qty 1

## 2023-02-07 MED ORDER — HYDROMORPHONE HCL 1 MG/ML IJ SOLN
0.5000 mg | INTRAMUSCULAR | Status: DC | PRN
  Administered 2023-02-07 – 2023-02-08 (×4): 0.5 mg via INTRAVENOUS
  Filled 2023-02-07 (×4): qty 0.5

## 2023-02-07 MED ORDER — IOHEXOL 350 MG/ML SOLN
100.0000 mL | Freq: Once | INTRAVENOUS | Status: AC | PRN
  Administered 2023-02-07: 100 mL via INTRAVENOUS

## 2023-02-07 MED ORDER — SODIUM CHLORIDE 0.9 % IV SOLN
INTRAVENOUS | Status: AC | PRN
  Administered 2023-02-07: 1000 mL via INTRAVENOUS

## 2023-02-07 NOTE — Progress Notes (Addendum)
Orthopedic Tech Progress Note Patient Details:  Hailey [name] 14-Jul-1935 [id number]  Level II trauma, upgraded to Level I. Ortho tech not needed at this time  Patient ID: [name], female   DOB: 1935-02-24, 88 y.o.   MRN: [id number]  [name] 02/07/2023, 6:43 PM

## 2023-02-07 NOTE — ED Notes (Signed)
Taken to CT by Herbert Seta, trauma RN and Dr.Thompson.

## 2023-02-07 NOTE — ED Notes (Signed)
Placed on 2LPM [place] as pt O2 drops at 90% on RA.

## 2023-02-07 NOTE — ED Notes (Signed)
Help get patient on the monitor did a manual blood pressure patient is resting with nurses and doctors at bedside

## 2023-02-07 NOTE — H&P (Signed)
Hailey [name] 1935/04/06  [id number].    Requesting MD: Dr. Elayne Snare Chief Complaint/Reason for Consult: Level 1 trauma  HPI: Hailey [name] is a 88 y.o. female with a hx of PAF on Eliquis (last dose 5/30 am) who presented as a level 2 trauma and was upgraded to a level 1 trauma for hypotension after a GLF. Patient landed on her left side. No LOC. Only complains of left shoulder pain. Initial pressure 60/30 with EMS. Given 300cc bolus. Pressure in trauma bay 91/61. Bradycardic. Reports hx of this and did take metoprolol this am. CXR and Pelvic xray done in trauma bay. Taken to CT scanner.   From merged chart PMHx: A. Fib, Bradycardia (41-50bpm) - temporary, CKD2, CAD s/p DES to RCA in 2018, GERD, HTN. There is documentation of frequent falls in her chart Prior Abdominal Surgeries: Appendectomy, Hysterectomy  Allergies: NKDA  ROS: ROS As above, see hpi  No family history on file.  Past Medical History:  Diagnosis Date   Arthritis    Atrial fibrillation [facility])       Social History:  reports that she has never smoked. She has never used smokeless tobacco. She reports that she does not drink alcohol and does not use drugs.  Allergies: Not on File  (Not in a hospital admission)    Physical Exam: Blood pressure 91/61, pulse (!) 55, temperature 97.6 F (36.4 C), temperature source Oral, resp. rate 10, height 5\' 3"  (1.6 m), weight 63.5 kg, SpO2 100 %. General: pleasant, WD/WN female who is laying in bed in NAD HEENT: head is normocephalic, atraumatic.  Sclera are noninjected.  PERRL.  Ears and nose without any masses or lesions.  Mouth is pink and moist. Dentition fair Neck: C-Collar in place Heart: Bradycardic with irregular rhythm Palpable radial and pedal pulses bilaterally  Lungs: CTAB, no wheezes, rhonchi, or rales noted.  Respiratory effort nonlabored. On 2L.  Abd:  Soft, NT/ND, negative FAST MS: No thoracic or lumbar ttp or step offs. Splinting of LUE.  Otherwise not gross deformity of RUE or BLE's. Skin: Small skin tear to medial lower leg. Otherwise warm and dry  Psych: A&Ox4 with an appropriate affect Neuro: GCS15, cranial nerves grossly intact, MAE's   Results for orders placed or performed during the hospital encounter of 02/07/23 (from the past 48 hour(s))  I-Stat Chem 8, ED     Status: Abnormal   Collection Time: 02/07/23 11:48 AM  Result Value Ref Range   Sodium 136 135 - 145 mmol/L   Potassium 4.2 3.5 - 5.1 mmol/L   Chloride 105 98 - 111 mmol/L   BUN 25 (H) 8 - 23 mg/dL   Creatinine, Ser 7.82 (H) 0.44 - 1.00 mg/dL   Glucose, Bld 956 (H) 70 - 99 mg/dL    Comment: Glucose reference range applies only to samples taken after fasting for at least 8 hours.   Calcium, Ion 1.15 1.15 - 1.40 mmol/L   TCO2 22 22 - 32 mmol/L   Hemoglobin 10.9 (L) 12.0 - 15.0 g/dL   HCT 21.3 (L) 08.6 - 57.8 %   No results found.  Anti-infectives (From admission, onward)    None       Assessment/Plan GLF on Eliquis  L humeral head and neck fx - we have consulted Orthopedics  CTH, CT C-Spine and CT CAP reviewed with radiology. No other traumatic injuries identified. Would leave C-Collar in place until final reads of CT C-Spine. If final reads negative for  acute injury, C-Collar can be removed. Recommend medical admission. We will sign off.    Update - C spine cleared - remove collar Critical care  Violeta Gelinas, MD, MPH, FACS Please use [email or web address] to contact on call provider

## 2023-02-07 NOTE — Consult Note (Signed)
Cardiology Consultation   Patient ID: Hailey [name] MRN: [id number]; DOB: 21-Dec-1934  Admit date: 02/07/2023 Date of Consult: 02/07/2023  PCP:  Tresa Garter, MD   [facility] Providers Cardiologist:  Meriam Sprague, MD    Patient Profile:   Hailey [name] is a 88 y.o. female with a hx of HTN, permanent atrial fibrillation, CAD s/p DES to RCA in 2018, aortic atherosclerosis, recurrent falls, chronic diastolic heart failure who is being seen 02/07/2023 for the evaluation of bradycardia at the request of Dr. Theresia Lo.  History of Present Illness:   Ms. Hailey [name] is an 88 year old female with a history of hypertension, permanent atrial fibrillation, CAD, aortic atherosclerosis.  Patient is currently followed by Dr. Shari Prows, previously followed by Dr. Katrinka Blazing.  Per chart review, patient has had atrial fibrillation since at least 2016. At that time, she wore a cardiac monitor in 08/2015 that showed continuous atrial fibrillation.  Heart rate was well-controlled on metoprolol.  She later underwent echocardiogram on 09/16/2015 that showed EF 55-60% with no regional wall motion abnormalities, mild mitral valve regurgitation, mild-moderate tricuspid valve regurgitation.  Cardiac catheterization 06/2017 showed severe diffuse three-vessel coronary calcification particularly in the right coronary artery.  She underwent successful PCI with orbital atherectomy followed by stenting of heavily calcified RCA.  Nuclear stress test in 09/2018 was a low risk study without evidence of ischemia or infarction.  Cardiac monitoring in 04/2019 showed continuous atrial fibrillation with a well-controlled heart rate.   In 07/2021, patient was referred to Dr. Lalla Brothers for consideration of a Watchman device due to recurrent falls.  During the procedure, patient was noted to have an anterior directed, acutely angled, chicken when left atrial appendage occlusion device was not able to be successfully  deployed.  She has remained on anticoagulation.  She continued to have recurrent falls with bleeding while on Eliquis and her dose was lowered by Dr. Katrinka Blazing in 05/2022.  Patient was last seen by cardiology on 12/03/2022.  At that time, patient reported that she was doing well without anginal symptoms.  She remained on Eliquis 2.5 mg daily (dose reduced due to multiple falls).  She was also on metoprolol 12.5 mg daily and losartan 12.5 mg daily.  She was tolerating higher BP due to history of orthostasis.  She remained on Jardiance 10 mg daily for chronic diastolic heart failure and was not on any additional diuretics at that time.  Patient presented to the ED on 5/30 after she fell at home on her carpeted floor. Patient reported that she had tripped on her housekeeper's shoe while walking. Initial BP was 60/30 with EMS and she was given IV fluids and fentanyl en route with EMS. BP was 101/69 in the ED. HR 42 BPM. Labs showed Na 135, K 4.1, creatinine 1.23, albumin 3.0, WBC 4.9, hemoglobin 11.2, platelets 179. Imaging showed a left humerus fracture and orthopedic surgery was consulted. They recommended non-operative treatment with sling and NWB.   While in the ED, patient was noted to have HR down to the 30s. She did take her metoprolol today. Cardiology was consulted for bradycardia.   On interview, patient reports that she wears an apple watch, and her HR is predominantly in the upper 50s at home. She denies recent dizziness, lightheadedness, syncope, near syncope, fatigue. Denies chest pain on exertion. Occasionally gets short winded on exertion, but no orthopnea, PND, ankle edema. She is chronically in afib. Today, she was walking in her house and she tripped over her  housekeeper. She denies having any weakness, syncope, near syncope, dizziness, loss of balance prior to her fall.    Past Medical History:  Diagnosis Date   Arthritis    Atrial fibrillation (HCC)        Home Medications:  Prior to  Admission medications   Medication Sig Start Date End Date Taking? Authorizing Provider  apixaban (ELIQUIS) 2.5 MG TABS tablet Take 2.5 mg by mouth 2 (two) times daily.    [provider]  atorvastatin (LIPITOR) 40 MG tablet Take 40 mg by mouth every Monday, Wednesday, and Friday.    [provider]  DULoxetine (CYMBALTA) 20 MG capsule Take 20 mg by mouth daily.    [provider]  empagliflozin (JARDIANCE) 10 MG TABS tablet Take 10 mg by mouth daily.    [provider]  escitalopram (LEXAPRO) 5 MG tablet Take 5 mg by mouth daily.    [provider]  esomeprazole (NEXIUM) 40 MG capsule Take 40 mg by mouth daily at 12 noon.    [provider]  fluorouracil (EFUDEX) 5 % cream Apply 1 Application topically daily. To precancers    [provider]  GABAPENTIN EX Apply 1 Application topically 3 (three) times daily as needed (To vulva). Gabapentin 6% cream    [provider]  LORazepam (ATIVAN) 1 MG tablet Take 1 mg by mouth 2 (two) times daily as needed for anxiety or sleep.    [provider]  losartan (COZAAR) 25 MG tablet Take 12.5 mg by mouth daily.    [provider]  meclizine (ANTIVERT) 25 MG tablet Take 25 mg by mouth 3 (three) times daily as needed for dizziness.    [provider]  metoprolol succinate (TOPROL-XL) 25 MG 24 hr tablet Take 12.5 mg by mouth daily.    [provider]  metroNIDAZOLE (FLAGYL) 250 MG tablet Take 250 mg by mouth 3 (three) times daily. Patient not taking: Reported on 02/07/2023    [provider]  nitroGLYCERIN (NITROSTAT) 0.4 MG SL tablet Place 0.4 mg under the tongue every 5 (five) minutes as needed for chest pain.    [provider]    Inpatient Medications: Scheduled Meds:  apixaban  2.5 mg Oral BID   [START ON 02/08/2023] atorvastatin  40 mg Oral Q M,W,F   DULoxetine  20 mg Oral Daily   empagliflozin  10 mg Oral Daily   escitalopram  5  mg Oral Daily   pantoprazole  40 mg Oral Daily   sodium chloride flush  3 mL Intravenous Q12H   Continuous Infusions:  sodium chloride Stopped (02/07/23 1246)   PRN Meds: sodium chloride, bisacodyl, HYDROmorphone (DILAUDID) injection, labetalol, LORazepam, meclizine, oxyCODONE, senna-docusate  Allergies:   Not on File  Social History:   Social History   Socioeconomic History   Marital status: Married    Spouse name: Not on file   Number of children: Not on file   Years of education: Not on file   Highest education level: Not on file  Occupational History   Not on file  Tobacco Use   Smoking status: Never   Smokeless tobacco: Never  Substance and Sexual Activity   Alcohol use: Never   Drug use: Never   Sexual activity: Not on file  Other Topics Concern   Not on file  Social History Narrative   Not on file   Social Determinants of Health   Financial Resource Strain: Not on file  Food Insecurity: Not on file  Transportation Needs: Not on file  Physical Activity: Not on file  Stress: Not on file  Social Connections: Not on file  Intimate Partner Violence: Not on file    Family History:   No family history on file.   ROS:  Please see the history of present illness.   All other ROS reviewed and negative.     Physical Exam/Data:   Vitals:   02/07/23 1345 02/07/23 1400 02/07/23 1416 02/07/23 1430  BP: 119/74 131/76  125/85  Pulse: (!) 53 (!) 58  (!) 57  Resp: 20 15  15   Temp:   97.9 F (36.6 C)   TempSrc:   Oral   SpO2: 100% 100%  100%  Weight:      Height:        Intake/Output Summary (Last 24 hours) at 02/07/2023 1521 Last data filed at 02/07/2023 1246 Gross per 24 hour  Intake 1000 ml  Output --  Net 1000 ml      02/07/2023   11:36 AM  Last 3 Weights  Weight (lbs) 140 lb  Weight (kg) 63.504 kg     Body mass index is 24.8 kg/m.  General:  Well nourished, well developed. Appears uncomfortable, but in no acute distress  HEENT: normal Neck: no  JVD Vascular: Radial pulses 2+ bilaterally Cardiac:  normal S1, S2; irregular rate and rhythm. No murmurs  Lungs: Anterior lung exam clear to auscultation  Abd: soft, nontender Ext: no edema in BLE  Musculoskeletal:  No deformities, BUE and BLE strength normal and equal Skin: warm and dry  Neuro:  CNs 2-12 intact, no focal abnormalities noted Psych:  Normal affect   EKG:  The EKG was personally reviewed and demonstrates:  EKG not available in system  Telemetry:  Telemetry was personally reviewed and demonstrates:  atrial fibrillation, HR predominantly in the 50s. Very briefly in the upper 30s   Relevant CV Studies:   Laboratory Data:  High Sensitivity Troponin:  No results for input(s): "TROPONINIHS" in the last 720 hours.   Chemistry Recent Labs  Lab 02/07/23 1148  NA 135  136  K 4.1  4.2  CL 108  105  CO2 20*  GLUCOSE 144*  140*  BUN 24*  25*  CREATININE 1.23*  1.20*  CALCIUM 8.3*  GFRNONAA 43*  ANIONGAP 7    Recent Labs  Lab 02/07/23 1148  PROT 5.3*  ALBUMIN 3.0*  AST 22  ALT 12  ALKPHOS 46  BILITOT 0.6   Lipids No results for input(s): "CHOL", "TRIG", "HDL", "LABVLDL", "LDLCALC", "CHOLHDL" in the last 168 hours.  Hematology Recent Labs  Lab 02/07/23 1148  WBC 4.9  RBC 3.37*  HGB 11.2*  10.9*  HCT 33.2*  32.0*  MCV 98.5  MCH 33.2  MCHC 33.7  RDW 14.1  PLT 179   Thyroid No results for input(s): "TSH", "FREET4" in the last 168 hours.  BNPNo results for input(s): "BNP", "PROBNP" in the last 168 hours.  DDimer No results for input(s): "DDIMER" in the last 168 hours.   Radiology/Studies:  CT HEAD WO CONTRAST ( )  Result Date: 02/07/2023 CLINICAL DATA:  Head trauma, minor (Age >= 65y); Neck trauma (Age >= 65y) EXAM: CT HEAD WITHOUT CONTRAST CT CERVICAL SPINE WITHOUT CONTRAST TECHNIQUE: Multidetector CT imaging of the head and cervical spine was performed following the standard protocol without intravenous contrast. Multiplanar CT image  reconstructions of the cervical spine were also generated. RADIATION DOSE REDUCTION: This exam was performed according to the departmental dose-optimization  program which includes automated exposure control, adjustment of the mA and/or kV according to patient size and/or use of iterative reconstruction technique. COMPARISON:  MR Head 04/25/22, CT C Spine 04/24/22 FINDINGS: CT HEAD FINDINGS Brain: No evidence of acute infarction, hemorrhage, hydrocephalus, extra-axial collection or mass lesion/mass effect. There is sequela of moderate chronic microvascular ischemic change with a chronic infarct in the left parietal lobe in the left frontal lobe Vascular: No hyperdense vessel or unexpected calcification. Skull: Normal. Negative for fracture or focal lesion. Sinuses/Orbits: No middle ear or mastoid effusion. Paranasal sinuses are notable for mucosal thickening left maxillary sinus. Bilateral lens replacement. Orbits are otherwise unremarkable Other: None. CT CERVICAL SPINE FINDINGS Alignment: Normal. Skull base and vertebrae: No acute fracture. No primary bone lesion or focal pathologic process. Soft tissues and spinal canal: No prevertebral fluid or swelling. No visible canal hematoma. Hyperdense region in the left aspect of the spinal canal at the C3-C4 level (series 9, image 38) correlates with the portion of the left vertebral artery that courses through the spinal canal. Disc levels:  No evidence of high-grade spinal canal stenosis. Upper chest: Negative. Other: None IMPRESSION: 1. No acute intracranial abnormality. 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 2. No acute fracture or traumatic subluxation of the cervical spine. 3. Redemonstrated tortuous left vertebral artery with the V2 segment coursing through the left aspect of the spinal canal. Findings were discussed with Dr. Laurell Josephs on 02/07/23 at 12:15 PM Electronically Signed   By:  Lorenza Cambridge M.D.   On: 02/07/2023 12:29   CT CERVICAL SPINE WO CONTRAST  Result Date: 02/07/2023 CLINICAL DATA:  Head trauma, minor (Age >= 65y); Neck trauma (Age >= 65y) EXAM: CT HEAD WITHOUT CONTRAST CT CERVICAL SPINE WITHOUT CONTRAST TECHNIQUE: Multidetector CT imaging of the head and cervical spine was performed following the standard protocol without intravenous contrast. Multiplanar CT image reconstructions of the cervical spine were also generated. RADIATION DOSE REDUCTION: This exam was performed according to the departmental dose-optimization program which includes automated exposure control, adjustment of the mA and/or kV according to patient size and/or use of iterative reconstruction technique. COMPARISON:  MR Head 04/25/22, CT C Spine 04/24/22 FINDINGS: CT HEAD FINDINGS Brain: No evidence of acute infarction, hemorrhage, hydrocephalus, extra-axial collection or mass lesion/mass effect. There is sequela of moderate chronic microvascular ischemic change with a chronic infarct in the left parietal lobe in the left frontal lobe Vascular: No hyperdense vessel or unexpected calcification. Skull: Normal. Negative for fracture or focal lesion. Sinuses/Orbits: No middle ear or mastoid effusion. Paranasal sinuses are notable for mucosal thickening left maxillary sinus. Bilateral lens replacement. Orbits are otherwise unremarkable Other: None. CT CERVICAL SPINE FINDINGS Alignment: Normal. Skull base and vertebrae: No acute fracture. No primary bone lesion or focal pathologic process. Soft tissues and spinal canal: No prevertebral fluid or swelling. No visible canal hematoma. Hyperdense region in the left aspect of the spinal canal at the C3-C4 level (series 9, image 38) correlates with the portion of the left vertebral artery that courses through the spinal canal. Disc levels:  No evidence of high-grade spinal canal stenosis. Upper chest: Negative. Other: None IMPRESSION: 1. No acute intracranial abnormality.  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 2. No acute fracture or traumatic subluxation of the cervical spine. 3. Redemonstrated tortuous left vertebral artery with the V2 segment coursing through the left aspect of the  spinal canal. Findings were discussed with Dr. Laurell Josephs on 02/07/23 at 12:15 PM Electronically Signed   By: Lorenza Cambridge M.D.   On: 02/07/2023 12:29   CT CHEST ABDOMEN PELVIS W CONTRAST  Result Date: 02/07/2023 CLINICAL DATA:  Fall. EXAM: CT CHEST, ABDOMEN, AND PELVIS WITH CONTRAST TECHNIQUE: Multidetector CT imaging of the chest, abdomen and pelvis was performed following the standard protocol during bolus administration of intravenous contrast. RADIATION DOSE REDUCTION: This exam was performed according to the departmental dose-optimization program which includes automated exposure control, adjustment of the mA and/or kV according to patient size and/or use of iterative reconstruction technique. CONTRAST:  Unspecified. COMPARISON:  November 19, 2021. FINDINGS: CT CHEST FINDINGS Cardiovascular: 4.4 cm ascending thoracic aortic aneurysm is noted. No dissection is noted. Normal cardiac size. No pericardial effusion. Coronary artery calcifications are noted. Mediastinum/Nodes: Thyroid gland and esophagus are unremarkable. Calcified right paratracheal adenopathy is noted consistent with prior granulomatous disease. Lungs/Pleura: No pneumothorax or pleural effusion is noted. Minimal bilateral posterior basilar subsegmental atelectasis. 4 mm nodule noted in right middle lobe best seen on image number 96 of series 4. Musculoskeletal: Moderately displaced and comminuted proximal left humeral head and neck fracture is noted. CT ABDOMEN PELVIS FINDINGS Hepatobiliary: No focal liver abnormality is seen. No gallstones, gallbladder wall thickening, or biliary dilatation. Pancreas: Fatty replacement of pancreas is noted. Spleen: Normal in  size without focal abnormality. Adrenals/Urinary Tract: Adrenal glands appear normal. No hydronephrosis or renal obstruction is noted. Urinary bladder is unremarkable. Stomach/Bowel: Stomach appears normal. Diverticulosis of descending and sigmoid colon is noted without inflammation. There is no evidence of bowel obstruction. Vascular/Lymphatic: Aortic atherosclerosis. No enlarged abdominal or pelvic lymph nodes. Reproductive: Status post hysterectomy. No adnexal masses. Other: No abdominal wall hernia or abnormality. No abdominopelvic ascites. Musculoskeletal: Old T10 compression fracture is noted. No acute osseous abnormality is noted. IMPRESSION: Moderately displaced and comminuted proximal left humeral head and neck fracture. No other definite acute traumatic injury is noted in the chest, abdomen or pelvis. 4.4 cm ascending thoracic aortic aneurysm. Recommend annual imaging followup by CTA or MRA. This recommendation follows 2010 ACCF/AHA/AATS/ACR/ASA/SCA/SCAI/SIR/STS/SVM Guidelines for the Diagnosis and Management of Patients with Thoracic Aortic Disease. Circulation. 2010; 121: Z610-R604. Aortic aneurysm NOS (ICD10-I71.9). 4 mm right middle lobe nodule. No follow-up needed if patient is low-risk.This recommendation follows the consensus statement: Guidelines for Management of Incidental Pulmonary Nodules Detected on CT Images: From the Fleischner Society 2017; Radiology 2017; 284:228-243. Diverticulosis of descending and sigmoid colon without inflammation. Old T10 compression fracture is noted. Coronary artery calcifications are noted. Aortic Atherosclerosis (ICD10-I70.0). These results were discussed in person at the time of interpretation on 02/07/2023 at 12:10 pm to provider Violeta Gelinas. MD, who verbally acknowledged these results. Electronically Signed   By: Lupita Raider M.D.   On: 02/07/2023 12:24   DG Pelvis Portable  Result Date: 02/07/2023 CLINICAL DATA:  Fall. EXAM: PORTABLE PELVIS 1-2 VIEWS  COMPARISON:  None Available. FINDINGS: There is no evidence of pelvic fracture or diastasis. No pelvic bone lesions are seen. IMPRESSION: Negative. Electronically Signed   By: Lupita Raider M.D.   On: 02/07/2023 12:05   DG Chest Port 1 View  Result Date: 02/07/2023 CLINICAL DATA:  Fall. EXAM: PORTABLE CHEST 1 VIEW COMPARISON:  November 19, 2021. FINDINGS: Mild cardiomegaly the heart size and mediastinal contours are within normal limits. Both lungs are clear. The visualized skeletal structures are unremarkable. IMPRESSION: No active disease. Electronically Signed   By: Lupita Raider  M.D.   On: 02/07/2023 12:04     Assessment and Plan:   Permanent Atrial Fibrillation  Bradycardia  - Patient is in permanent atrial fibrillation. Today in the ED, her HR dropped into the upper 30s for a few seconds, but quickly recovered. HR has otherwise been in the 50s - She denies dizziness, syncope, near syncope, weakness, fatigue. Today when she fell, she tripped over someone else's feet. Bradycardia did not contribute to her fall  - Hold metoprolol succinate 12.5 mg BID  - Ordered outpatient 2 week zio  - Continue eliquis 2.5 mg BID- note, patient does have history of frequent falls. Was referred for watchman in the past, but does not have the proper anatomy   Chronic HFpEF  Moderate TR Mild MR  - Most recent echocardiogram from 2021 showed EF 65-70%, G2DD, moderate RV dysfunction, moderate TR, mild MR  - She is scheduled for repeat echocardiogram in 08/2023. No need to repeat sooner  - She is euvolemic today on exam. Occasionally has some shortness of breath on exertion, but no orthopnea, PND, ankle edema, sob at rest. Overall has NYHA class I-II symptoms  - Continue jardiance   Ascending thoracic aortic aneurysm  - CT chest today showed a 4.4. cm ascending thoracic aortic aneurysm. Recommended annual imaging  -Previously measured 4.5 mm on CT in 08/2021 - She has a repeat echocardiogram in 08/2023 as  above  - BP well controlled   CAD - Patient had DES to RCA in 2018 - Denies recent chest pain on exertion  - Not on ASA due to eliquis use  - Continue lipitor 40 mg daily  - Stopping metoprolol due to bradycardia   Risk Assessment/Risk Scores:   New York Heart Association (NYHA) Functional Class NYHA Class I  CHA2DS2-VASc Score = 6  This indicates a 9.7% annual risk of stroke. The patient's score is based upon: CHF History: 1 HTN History: 1 Diabetes History: 0 Stroke History: 0 Vascular Disease History: 1 Age Score: 2 Gender Score: 1   For questions or updates, please contact [facility] Please consult [email or web address] for contact info under    Signed, Jonita Albee, PA-C  02/07/2023 3:21 PM

## 2023-02-07 NOTE — ED Triage Notes (Addendum)
Level 1 trauma. Fell at home on carpet floor. Pt reports she tripped on her housekeeper while walking. On Eliquis for afib. Landed to her left shoulder with obvious deformity. No LOC. Initial bp was 60/30. EMS reports pt was pale and clammy. Alert and oriented x 4. EMS gave 300cc bolus NS and fentanyl en route.

## 2023-02-07 NOTE — Progress Notes (Unsigned)
Enrolled for [employer] to mail a ZIO XT long term holter monitor to the patients address on file.   Dr. Pemberton to read. 

## 2023-02-07 NOTE — ED Notes (Signed)
Ortho at bedside.

## 2023-02-07 NOTE — ED Notes (Signed)
ED TO INPATIENT HANDOFF REPORT  ED Nurse Name and Phone #: Sheilah Mins [phone])  S Name/Age/Gender Hailey [name] 88 y.o. female Room/Bed: TRAAC/TRAAC  Code Status   Code Status: Full Code  Home/SNF/Other Home Patient oriented to: self, place, time, and situation Is this baseline? Yes   Triage Complete: Triage complete  Chief Complaint Fall [W19.XXXA]  Triage Note Level 1 trauma. Fell at home on carpet floor. Pt reports she tripped on her housekeeper while walking. On Eliquis for afib. Landed to her left shoulder with obvious deformity. No LOC. Initial bp was 60/30. EMS reports pt was pale and clammy. Alert and oriented x 4. EMS gave 300cc bolus NS and fentanyl en route.   Allergies Not on File  Level of Care/Admitting Diagnosis ED Disposition     ED Disposition  Admit   Condition  --   Comment  Hospital Area: [facility] [[id number]]  Level of Care: Telemetry Medical [104]  May place patient in observation at [facility] or [facility] if equivalent level of care is available:: No  Covid Evaluation: Asymptomatic - no recent exposure (last 10 days) testing not required  Diagnosis: Fall [[id number]]  Admitting Physician: Emeline General [[id number]]  Attending Physician: Emeline General [[id number]]          B Medical/Surgery History Past Medical History:  Diagnosis Date   Arthritis    Atrial fibrillation (HCC)       A IV Location/Drains/Wounds Patient Lines/Drains/Airways Status     Active Line/Drains/Airways     Name Placement date Placement time Site Days   Peripheral IV 02/07/23 20 G Right Antecubital 02/07/23  1130  Antecubital  less than 1   Peripheral IV 02/07/23 20 G Anterior;Right Hand 02/07/23  1141  Hand  less than 1            Intake/Output Last 24 hours  Intake/Output Summary (Last 24 hours) at 02/07/2023 1508 Last data filed at 02/07/2023 1246 Gross per 24 hour  Intake 1000 ml  Output --  Net 1000 ml     Labs/Imaging Results for orders placed or performed during the hospital encounter of 02/07/23 (from the past 48 hour(s))  Sample to Blood Bank     Status: None   Collection Time: 02/07/23 11:40 AM  Result Value Ref Range   Blood Bank Specimen SAMPLE AVAILABLE FOR TESTING    Sample Expiration      02/10/2023,2359 Performed at [facility], [address], [place], [place] [address]   Comprehensive metabolic panel     Status: Abnormal   Collection Time: 02/07/23 11:48 AM  Result Value Ref Range   Sodium 135 135 - 145 mmol/L   Potassium 4.1 3.5 - 5.1 mmol/L   Chloride 108 98 - 111 mmol/L   CO2 20 (L) 22 - 32 mmol/L   Glucose, Bld 144 (H) 70 - 99 mg/dL    Comment: Glucose reference range applies only to samples taken after fasting for at least 8 hours.   BUN 24 (H) 8 - 23 mg/dL   Creatinine, Ser 3.08 (H) 0.44 - 1.00 mg/dL   Calcium 8.3 (L) 8.9 - 10.3 mg/dL   Total Protein 5.3 (L) 6.5 - 8.1 g/dL   Albumin 3.0 (L) 3.5 - 5.0 g/dL   AST 22 15 - 41 U/L   ALT 12 0 - 44 U/L   Alkaline Phosphatase 46 38 - 126 U/L   Total Bilirubin 0.6 0.3 - 1.2 mg/dL  GFR, Estimated 43 (L) >60 mL/min    Comment: (NOTE) Calculated using the CKD-EPI Creatinine Equation (2021)    Anion gap 7 5 - 15    Comment: Performed at [facility], [address], [place], [place] [address]  I-Stat Chem 8, ED     Status: Abnormal   Collection Time: 02/07/23 11:48 AM  Result Value Ref Range   Sodium 136 135 - 145 mmol/L   Potassium 4.2 3.5 - 5.1 mmol/L   Chloride 105 98 - 111 mmol/L   BUN 25 (H) 8 - 23 mg/dL   Creatinine, Ser 6.04 (H) 0.44 - 1.00 mg/dL   Glucose, Bld 540 (H) 70 - 99 mg/dL    Comment: Glucose reference range applies only to samples taken after fasting for at least 8 hours.   Calcium, Ion 1.15 1.15 - 1.40 mmol/L   TCO2 22 22 - 32 mmol/L   Hemoglobin 10.9 (L) 12.0 - 15.0 g/dL   HCT 98.1 (L) 19.1 - 47.8 %  CBC     Status: Abnormal   Collection Time: 02/07/23 11:48 AM  Result  Value Ref Range   WBC 4.9 4.0 - 10.5 K/uL   RBC 3.37 (L) 3.87 - 5.11 MIL/uL   Hemoglobin 11.2 (L) 12.0 - 15.0 g/dL   HCT 29.5 (L) 62.1 - 30.8 %   MCV 98.5 80.0 - 100.0 fL   MCH 33.2 26.0 - 34.0 pg   MCHC 33.7 30.0 - 36.0 g/dL   RDW 65.7 84.6 - 96.2 %   Platelets 179 150 - 400 K/uL   nRBC 0.0 0.0 - 0.2 %    Comment: Performed at [facility], [address], [place], [place] [address]  Ethanol     Status: None   Collection Time: 02/07/23 11:48 AM  Result Value Ref Range   Alcohol, Ethyl (B) <10 <10 mg/dL    Comment: (NOTE) Lowest detectable limit for serum alcohol is 10 mg/dL.  For medical purposes only. Performed at [facility], [address], [place], [place] [address]   Lactic acid, plasma     Status: Abnormal   Collection Time: 02/07/23 11:48 AM  Result Value Ref Range   Lactic Acid, Venous 2.2 (HH) 0.5 - 1.9 mmol/L    Comment: CRITICAL RESULT CALLED TO, READ BACK BY AND VERIFIED WITH Wilhemena Durie RN @ 1240  02/07/23 BUTLER J. Performed at [facility], [address], [place], [place] [address]   Protime-INR     Status: Abnormal   Collection Time: 02/07/23 11:48 AM  Result Value Ref Range   Prothrombin Time 15.3 (H) 11.4 - 15.2 seconds   INR 1.2 0.8 - 1.2    Comment: (NOTE) INR goal varies based on device and disease states. Performed at [facility], [address], [place], [place] [address]    CT HEAD WO CONTRAST ( )  Result Date: 02/07/2023 CLINICAL DATA:  Head trauma, minor (Age >= 65y); Neck trauma (Age >= 65y) EXAM: CT HEAD WITHOUT CONTRAST CT CERVICAL SPINE WITHOUT CONTRAST TECHNIQUE: Multidetector CT imaging of the head and cervical spine was performed following the standard protocol without intravenous contrast. Multiplanar CT image reconstructions of the cervical spine were also generated. RADIATION DOSE REDUCTION: This exam was performed according to the departmental dose-optimization program which includes automated exposure  control, adjustment of the Demontae Antunes and/or kV according to patient size and/or use of iterative reconstruction technique. COMPARISON:  MR Head 04/25/22, CT C Spine 04/24/22  FINDINGS: CT HEAD FINDINGS Brain: No evidence of acute infarction, hemorrhage, hydrocephalus, extra-axial collection or mass lesion/mass effect. There is sequela of moderate chronic microvascular ischemic change with a chronic infarct in the left parietal lobe in the left frontal lobe Vascular: No hyperdense vessel or unexpected calcification. Skull: Normal. Negative for fracture or focal lesion. Sinuses/Orbits: No middle ear or mastoid effusion. Paranasal sinuses are notable for mucosal thickening left maxillary sinus. Bilateral lens replacement. Orbits are otherwise unremarkable Other: None. CT CERVICAL SPINE FINDINGS Alignment: Normal. Skull base and vertebrae: No acute fracture. No primary bone lesion or focal pathologic process. Soft tissues and spinal canal: No prevertebral fluid or swelling. No visible canal hematoma. Hyperdense region in the left aspect of the spinal canal at the C3-C4 level (series 9, image 38) correlates with the portion of the left vertebral artery that courses through the spinal canal. Disc levels:  No evidence of high-grade spinal canal stenosis. Upper chest: Negative. Other: None IMPRESSION: 1. No acute intracranial abnormality. 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 2. No acute fracture or traumatic subluxation of the cervical spine. 3. Redemonstrated tortuous left vertebral artery with the V2 segment coursing through the left aspect of the spinal canal. Findings were discussed with Dr. Laurell Josephs on 02/07/23 at 12:15 PM Electronically Signed   By: Lorenza Cambridge M.D.   On: 02/07/2023 12:29   CT CERVICAL SPINE WO CONTRAST  Result Date: 02/07/2023 CLINICAL DATA:  Head trauma, minor (Age >= 65y); Neck trauma (Age >= 65y) EXAM: CT HEAD WITHOUT  CONTRAST CT CERVICAL SPINE WITHOUT CONTRAST TECHNIQUE: Multidetector CT imaging of the head and cervical spine was performed following the standard protocol without intravenous contrast. Multiplanar CT image reconstructions of the cervical spine were also generated. RADIATION DOSE REDUCTION: This exam was performed according to the departmental dose-optimization program which includes automated exposure control, adjustment of the Dyllon Henken and/or kV according to patient size and/or use of iterative reconstruction technique. COMPARISON:  MR Head 04/25/22, CT C Spine 04/24/22 FINDINGS: CT HEAD FINDINGS Brain: No evidence of acute infarction, hemorrhage, hydrocephalus, extra-axial collection or mass lesion/mass effect. There is sequela of moderate chronic microvascular ischemic change with a chronic infarct in the left parietal lobe in the left frontal lobe Vascular: No hyperdense vessel or unexpected calcification. Skull: Normal. Negative for fracture or focal lesion. Sinuses/Orbits: No middle ear or mastoid effusion. Paranasal sinuses are notable for mucosal thickening left maxillary sinus. Bilateral lens replacement. Orbits are otherwise unremarkable Other: None. CT CERVICAL SPINE FINDINGS Alignment: Normal. Skull base and vertebrae: No acute fracture. No primary bone lesion or focal pathologic process. Soft tissues and spinal canal: No prevertebral fluid or swelling. No visible canal hematoma. Hyperdense region in the left aspect of the spinal canal at the C3-C4 level (series 9, image 38) correlates with the portion of the left vertebral artery that courses through the spinal canal. Disc levels:  No evidence of high-grade spinal canal stenosis. Upper chest: Negative. Other: None IMPRESSION: 1. No acute intracranial abnormality. 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 2. No acute fracture or traumatic subluxation of the cervical spine. 3.  Redemonstrated tortuous left vertebral artery with the V2 segment coursing through the left aspect of the spinal canal. Findings were discussed with Dr. Laurell Josephs on 02/07/23 at 12:15 PM Electronically Signed   By: Lorenza Cambridge M.D.   On: 02/07/2023 12:29   CT CHEST  ABDOMEN PELVIS W CONTRAST  Result Date: 02/07/2023 CLINICAL DATA:  Fall. EXAM: CT CHEST, ABDOMEN, AND PELVIS WITH CONTRAST TECHNIQUE: Multidetector CT imaging of the chest, abdomen and pelvis was performed following the standard protocol during bolus administration of intravenous contrast. RADIATION DOSE REDUCTION: This exam was performed according to the departmental dose-optimization program which includes automated exposure control, adjustment of the Chalise Pe and/or kV according to patient size and/or use of iterative reconstruction technique. CONTRAST:  Unspecified. COMPARISON:  November 19, 2021. FINDINGS: CT CHEST FINDINGS Cardiovascular: 4.4 cm ascending thoracic aortic aneurysm is noted. No dissection is noted. Normal cardiac size. No pericardial effusion. Coronary artery calcifications are noted. Mediastinum/Nodes: Thyroid gland and esophagus are unremarkable. Calcified right paratracheal adenopathy is noted consistent with prior granulomatous disease. Lungs/Pleura: No pneumothorax or pleural effusion is noted. Minimal bilateral posterior basilar subsegmental atelectasis. 4 mm nodule noted in right middle lobe best seen on image number 96 of series 4. Musculoskeletal: Moderately displaced and comminuted proximal left humeral head and neck fracture is noted. CT ABDOMEN PELVIS FINDINGS Hepatobiliary: No focal liver abnormality is seen. No gallstones, gallbladder wall thickening, or biliary dilatation. Pancreas: Fatty replacement of pancreas is noted. Spleen: Normal in size without focal abnormality. Adrenals/Urinary Tract: Adrenal glands appear normal. No hydronephrosis or renal obstruction is noted. Urinary bladder is unremarkable. Stomach/Bowel: Stomach  appears normal. Diverticulosis of descending and sigmoid colon is noted without inflammation. There is no evidence of bowel obstruction. Vascular/Lymphatic: Aortic atherosclerosis. No enlarged abdominal or pelvic lymph nodes. Reproductive: Status post hysterectomy. No adnexal masses. Other: No abdominal wall hernia or abnormality. No abdominopelvic ascites. Musculoskeletal: Old T10 compression fracture is noted. No acute osseous abnormality is noted. IMPRESSION: Moderately displaced and comminuted proximal left humeral head and neck fracture. No other definite acute traumatic injury is noted in the chest, abdomen or pelvis. 4.4 cm ascending thoracic aortic aneurysm. Recommend annual imaging followup by CTA or MRA. This recommendation follows 2010 ACCF/AHA/AATS/ACR/ASA/SCA/SCAI/SIR/STS/SVM Guidelines for the Diagnosis and Management of Patients with Thoracic Aortic Disease. Circulation. 2010; 121: Z610-R604. Aortic aneurysm NOS (ICD10-I71.9). 4 mm right middle lobe nodule. No follow-up needed if patient is low-risk.This recommendation follows the consensus statement: Guidelines for Management of Incidental Pulmonary Nodules Detected on CT Images: From the Fleischner Society 2017; Radiology 2017; 284:228-243. Diverticulosis of descending and sigmoid colon without inflammation. Old T10 compression fracture is noted. Coronary artery calcifications are noted. Aortic Atherosclerosis (ICD10-I70.0). These results were discussed in person at the time of interpretation on 02/07/2023 at 12:10 pm to provider Violeta Gelinas. MD, who verbally acknowledged these results. Electronically Signed   By: Lupita Raider M.D.   On: 02/07/2023 12:24   DG Pelvis Portable  Result Date: 02/07/2023 CLINICAL DATA:  Fall. EXAM: PORTABLE PELVIS 1-2 VIEWS COMPARISON:  None Available. FINDINGS: There is no evidence of pelvic fracture or diastasis. No pelvic bone lesions are seen. IMPRESSION: Negative. Electronically Signed   By: Lupita Raider  M.D.   On: 02/07/2023 12:05   DG Chest Port 1 View  Result Date: 02/07/2023 CLINICAL DATA:  Fall. EXAM: PORTABLE CHEST 1 VIEW COMPARISON:  November 19, 2021. FINDINGS: Mild cardiomegaly the heart size and mediastinal contours are within normal limits. Both lungs are clear. The visualized skeletal structures are unremarkable. IMPRESSION: No active disease. Electronically Signed   By: Lupita Raider M.D.   On: 02/07/2023 12:04    Pending Labs Unresulted Labs (From admission, onward)     Start     Ordered   02/07/23 1323  Lactic acid, plasma  Now then every 2 hours,   R (with STAT occurrences)      02/07/23 1322   02/07/23 1145  Urinalysis, Routine w reflex microscopic -Urine, Clean Catch  (Trauma Panel)  Once,   URGENT       Question:  Specimen Source  Answer:  Urine, Clean Catch   02/07/23 1145            Vitals/Pain Today's Vitals   02/07/23 1400 02/07/23 1408 02/07/23 1416 02/07/23 1430  BP: 131/76   125/85  Pulse: (!) 58   (!) 57  Resp: 15   15  Temp:   97.9 F (36.6 C)   TempSrc:   Oral   SpO2: 100%   100%  Weight:      Height:      PainSc:  9       Isolation Precautions No active isolations  Medications Medications  0.9 %  sodium chloride infusion (0 mLs Intravenous Stopped 02/07/23 1246)  oxyCODONE (Oxy IR/ROXICODONE) immediate release tablet 5 mg (has no administration in time range)  HYDROmorphone (DILAUDID) injection 0.5 mg (has no administration in time range)  atorvastatin (LIPITOR) tablet 40 mg (has no administration in time range)  labetalol (NORMODYNE) injection 10 mg (has no administration in time range)  DULoxetine (CYMBALTA) DR capsule 20 mg (has no administration in time range)  escitalopram (LEXAPRO) tablet 5 mg (has no administration in time range)  LORazepam (ATIVAN) tablet 0.5 mg (has no administration in time range)  empagliflozin (JARDIANCE) tablet 10 mg (has no administration in time range)  pantoprazole (PROTONIX) EC tablet 40 mg (has no  administration in time range)  meclizine (ANTIVERT) tablet 25 mg (has no administration in time range)  apixaban (ELIQUIS) tablet 2.5 mg (has no administration in time range)  sodium chloride flush (NS) 0.9 % injection 3 mL (has no administration in time range)  senna-docusate (Senokot-S) tablet 1 tablet (has no administration in time range)  bisacodyl (DULCOLAX) EC tablet 5 mg (has no administration in time range)  iohexol (OMNIPAQUE) 350 MG/ML injection 100 mL (100 mLs Intravenous Contrast Given 02/07/23 1207)  fentaNYL (SUBLIMAZE) injection 50 mcg (50 mcg Intravenous Given 02/07/23 1250)  oxyCODONE-acetaminophen (PERCOCET/ROXICET) 5-325 MG per tablet 1 tablet (1 tablet Oral Given 02/07/23 1406)    Mobility walks     Focused Assessments Neuro Assessment Handoff:  Swallow screen pass? Yes  Cardiac Rhythm: Sinus bradycardia       Neuro Assessment:   Neuro Checks:      Has TPA been given? No If patient is a Neuro Trauma and patient is going to OR before floor call report to 4N Charge nurse: [phone] or [phone]   R Recommendations: See Admitting Provider Note  Report given to:   Additional Notes:

## 2023-02-07 NOTE — Consult Note (Signed)
Reason for Consult:Left humerus fx Referring Physician: Elayne Snare Time called: 1200 Time at bedside: 1413   Hailey [name] is an 88 y.o. female.  HPI: Corky tripped over someone's shoe today and fell. She had immediate left shoulder pain. She was brought to the ED as a level 2 trauma activation 2/2 fall on thinners. Workup showed a left humerus fx and orthopedic surgery was consulted. She is RHD.  Past Medical History:  Diagnosis Date   Arthritis    Atrial fibrillation (HCC)     No family history on file.  Social History:  reports that she has never smoked. She has never used smokeless tobacco. She reports that she does not drink alcohol and does not use drugs.  Allergies: Not on File  Medications: I have reviewed the patient's current medications.  Results for orders placed or performed during the hospital encounter of 02/07/23 (from the past 48 hour(s))  Sample to Blood Bank     Status: None   Collection Time: 02/07/23 11:40 AM  Result Value Ref Range   Blood Bank Specimen SAMPLE AVAILABLE FOR TESTING    Sample Expiration      02/10/2023,2359 Performed at [facility], [address], [place], [place] [address]   Comprehensive metabolic panel     Status: Abnormal   Collection Time: 02/07/23 11:48 AM  Result Value Ref Range   Sodium 135 135 - 145 mmol/L   Potassium 4.1 3.5 - 5.1 mmol/L   Chloride 108 98 - 111 mmol/L   CO2 20 (L) 22 - 32 mmol/L   Glucose, Bld 144 (H) 70 - 99 mg/dL    Comment: Glucose reference range applies only to samples taken after fasting for at least 8 hours.   BUN 24 (H) 8 - 23 mg/dL   Creatinine, Ser 6.04 (H) 0.44 - 1.00 mg/dL   Calcium 8.3 (L) 8.9 - 10.3 mg/dL   Total Protein 5.3 (L) 6.5 - 8.1 g/dL   Albumin 3.0 (L) 3.5 - 5.0 g/dL   AST 22 15 - 41 U/L   ALT 12 0 - 44 U/L   Alkaline Phosphatase 46 38 - 126 U/L   Total Bilirubin 0.6 0.3 - 1.2 mg/dL   GFR, Estimated 43 (L) >60 mL/min    Comment: (NOTE) Calculated using the  CKD-EPI Creatinine Equation (2021)    Anion gap 7 5 - 15    Comment: Performed at [facility], [address] [place], [place] [address]  I-Stat Chem 8, ED     Status: Abnormal   Collection Time: 02/07/23 11:48 AM  Result Value Ref Range   Sodium 136 135 - 145 mmol/L   Potassium 4.2 3.5 - 5.1 mmol/L   Chloride 105 98 - 111 mmol/L   BUN 25 (H) 8 - 23 mg/dL   Creatinine, Ser 1.19 (H) 0.44 - 1.00 mg/dL   Glucose, Bld 147 (H) 70 - 99 mg/dL    Comment: Glucose reference range applies only to samples taken after fasting for at least 8 hours.   Calcium, Ion 1.15 1.15 - 1.40 mmol/L   TCO2 22 22 - 32 mmol/L   Hemoglobin 10.9 (L) 12.0 - 15.0 g/dL   HCT 82.9 (L) 56.2 - 13.0 %  CBC     Status: Abnormal   Collection Time: 02/07/23 11:48 AM  Result Value Ref Range   WBC 4.9 4.0 - 10.5 K/uL   RBC 3.37 (L) 3.87 - 5.11 MIL/uL   Hemoglobin 11.2 (L) 12.0 -  15.0 g/dL   HCT 09.6 (L) 04.5 - 40.9 %   MCV 98.5 80.0 - 100.0 fL   MCH 33.2 26.0 - 34.0 pg   MCHC 33.7 30.0 - 36.0 g/dL   RDW 81.1 91.4 - 78.2 %   Platelets 179 150 - 400 K/uL   nRBC 0.0 0.0 - 0.2 %    Comment: Performed at [facility], [address] [place], [place] [address]  Ethanol     Status: None   Collection Time: 02/07/23 11:48 AM  Result Value Ref Range   Alcohol, Ethyl (B) <10 <10 mg/dL    Comment: (NOTE) Lowest detectable limit for serum alcohol is 10 mg/dL.  For medical purposes only. Performed at [facility], [address] [place], [place] [address]   Lactic acid, plasma     Status: Abnormal   Collection Time: 02/07/23 11:48 AM  Result Value Ref Range   Lactic Acid, Venous 2.2 (HH) 0.5 - 1.9 mmol/L    Comment: CRITICAL RESULT CALLED TO, READ BACK BY AND VERIFIED WITH Wilhemena Durie RN @ 1240  02/07/23 BUTLER J. Performed at [facility], [address] [place], [place] [address]   Protime-INR     Status: Abnormal   Collection Time: 02/07/23 11:48 AM  Result Value Ref Range   Prothrombin  Time 15.3 (H) 11.4 - 15.2 seconds   INR 1.2 0.8 - 1.2    Comment: (NOTE) INR goal varies based on device and disease states. Performed at [facility], [address] [place], [place] [address]     CT HEAD WO CONTRAST ( )  Result Date: 02/07/2023 CLINICAL DATA:  Head trauma, minor (Age >= 65y); Neck trauma (Age >= 65y) EXAM: CT HEAD WITHOUT CONTRAST CT CERVICAL SPINE WITHOUT CONTRAST TECHNIQUE: Multidetector CT imaging of the head and cervical spine was performed following the standard protocol without intravenous contrast. Multiplanar CT image reconstructions of the cervical spine were also generated. RADIATION DOSE REDUCTION: This exam was performed according to the departmental dose-optimization program which includes automated exposure control, adjustment of the mA and/or kV according to patient size and/or use of iterative reconstruction technique. COMPARISON:  MR Head 04/25/22, CT C Spine 04/24/22 FINDINGS: CT HEAD FINDINGS Brain: No evidence of acute infarction, hemorrhage, hydrocephalus, extra-axial collection or mass lesion/mass effect. There is sequela of moderate chronic microvascular ischemic change with a chronic infarct in the left parietal lobe in the left frontal lobe Vascular: No hyperdense vessel or unexpected calcification. Skull: Normal. Negative for fracture or focal lesion. Sinuses/Orbits: No middle ear or mastoid effusion. Paranasal sinuses are notable for mucosal thickening left maxillary sinus. Bilateral lens replacement. Orbits are otherwise unremarkable Other: None. CT CERVICAL SPINE FINDINGS Alignment: Normal. Skull base and vertebrae: No acute fracture. No primary bone lesion or focal pathologic process. Soft tissues and spinal canal: No prevertebral fluid or swelling. No visible canal hematoma. Hyperdense region in the left aspect of the spinal canal at the C3-C4 level (series 9, image 38) correlates with the portion of the left vertebral artery that courses through  the spinal canal. Disc levels:  No evidence of high-grade spinal canal stenosis. Upper chest: Negative. Other: None IMPRESSION: 1. No acute intracranial abnormality. 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 2. No acute fracture or traumatic subluxation of the cervical spine. 3. Redemonstrated tortuous left vertebral artery with the V2 segment coursing through the left aspect of the spinal  canal. Findings were discussed with Dr. Laurell Josephs on 02/07/23 at 12:15 PM Electronically Signed   By: Lorenza Cambridge M.D.   On: 02/07/2023 12:29   CT CERVICAL SPINE WO CONTRAST  Result Date: 02/07/2023 CLINICAL DATA:  Head trauma, minor (Age >= 65y); Neck trauma (Age >= 65y) EXAM: CT HEAD WITHOUT CONTRAST CT CERVICAL SPINE WITHOUT CONTRAST TECHNIQUE: Multidetector CT imaging of the head and cervical spine was performed following the standard protocol without intravenous contrast. Multiplanar CT image reconstructions of the cervical spine were also generated. RADIATION DOSE REDUCTION: This exam was performed according to the departmental dose-optimization program which includes automated exposure control, adjustment of the mA and/or kV according to patient size and/or use of iterative reconstruction technique. COMPARISON:  MR Head 04/25/22, CT C Spine 04/24/22 FINDINGS: CT HEAD FINDINGS Brain: No evidence of acute infarction, hemorrhage, hydrocephalus, extra-axial collection or mass lesion/mass effect. There is sequela of moderate chronic microvascular ischemic change with a chronic infarct in the left parietal lobe in the left frontal lobe Vascular: No hyperdense vessel or unexpected calcification. Skull: Normal. Negative for fracture or focal lesion. Sinuses/Orbits: No middle ear or mastoid effusion. Paranasal sinuses are notable for mucosal thickening left maxillary sinus. Bilateral lens replacement. Orbits are otherwise unremarkable Other: None. CT  CERVICAL SPINE FINDINGS Alignment: Normal. Skull base and vertebrae: No acute fracture. No primary bone lesion or focal pathologic process. Soft tissues and spinal canal: No prevertebral fluid or swelling. No visible canal hematoma. Hyperdense region in the left aspect of the spinal canal at the C3-C4 level (series 9, image 38) correlates with the portion of the left vertebral artery that courses through the spinal canal. Disc levels:  No evidence of high-grade spinal canal stenosis. Upper chest: Negative. Other: None IMPRESSION: 1. No acute intracranial abnormality. 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 2. No acute fracture or traumatic subluxation of the cervical spine. 3. Redemonstrated tortuous left vertebral artery with the V2 segment coursing through the left aspect of the spinal canal. Findings were discussed with Dr. Laurell Josephs on 02/07/23 at 12:15 PM Electronically Signed   By: Lorenza Cambridge M.D.   On: 02/07/2023 12:29   CT CHEST ABDOMEN PELVIS W CONTRAST  Result Date: 02/07/2023 CLINICAL DATA:  Fall. EXAM: CT CHEST, ABDOMEN, AND PELVIS WITH CONTRAST TECHNIQUE: Multidetector CT imaging of the chest, abdomen and pelvis was performed following the standard protocol during bolus administration of intravenous contrast. RADIATION DOSE REDUCTION: This exam was performed according to the departmental dose-optimization program which includes automated exposure control, adjustment of the mA and/or kV according to patient size and/or use of iterative reconstruction technique. CONTRAST:  Unspecified. COMPARISON:  November 19, 2021. FINDINGS: CT CHEST FINDINGS Cardiovascular: 4.4 cm ascending thoracic aortic aneurysm is noted. No dissection is noted. Normal cardiac size. No pericardial effusion. Coronary artery calcifications are noted. Mediastinum/Nodes: Thyroid gland and esophagus are unremarkable. Calcified right paratracheal adenopathy is  noted consistent with prior granulomatous disease. Lungs/Pleura: No pneumothorax or pleural effusion is noted. Minimal bilateral posterior basilar subsegmental atelectasis. 4 mm nodule noted in right middle lobe best seen on image number 96 of series 4. Musculoskeletal: Moderately displaced and comminuted proximal left humeral head and neck fracture is noted. CT ABDOMEN PELVIS FINDINGS Hepatobiliary: No focal liver abnormality is seen. No gallstones, gallbladder wall thickening, or biliary dilatation. Pancreas: Fatty replacement of pancreas is noted. Spleen: Normal in size without focal abnormality. Adrenals/Urinary Tract: Adrenal glands appear normal. No hydronephrosis or  renal obstruction is noted. Urinary bladder is unremarkable. Stomach/Bowel: Stomach appears normal. Diverticulosis of descending and sigmoid colon is noted without inflammation. There is no evidence of bowel obstruction. Vascular/Lymphatic: Aortic atherosclerosis. No enlarged abdominal or pelvic lymph nodes. Reproductive: Status post hysterectomy. No adnexal masses. Other: No abdominal wall hernia or abnormality. No abdominopelvic ascites. Musculoskeletal: Old T10 compression fracture is noted. No acute osseous abnormality is noted. IMPRESSION: Moderately displaced and comminuted proximal left humeral head and neck fracture. No other definite acute traumatic injury is noted in the chest, abdomen or pelvis. 4.4 cm ascending thoracic aortic aneurysm. Recommend annual imaging followup by CTA or MRA. This recommendation follows 2010 ACCF/AHA/AATS/ACR/ASA/SCA/SCAI/SIR/STS/SVM Guidelines for the Diagnosis and Management of Patients with Thoracic Aortic Disease. Circulation. 2010; 121: Z610-R604. Aortic aneurysm NOS (ICD10-I71.9). 4 mm right middle lobe nodule. No follow-up needed if patient is low-risk.This recommendation follows the consensus statement: Guidelines for Management of Incidental Pulmonary Nodules Detected on CT Images: From the Fleischner  Society 2017; Radiology 2017; 284:228-243. Diverticulosis of descending and sigmoid colon without inflammation. Old T10 compression fracture is noted. Coronary artery calcifications are noted. Aortic Atherosclerosis (ICD10-I70.0). These results were discussed in person at the time of interpretation on 02/07/2023 at 12:10 pm to provider Violeta Gelinas. MD, who verbally acknowledged these results. Electronically Signed   By: Lupita Raider M.D.   On: 02/07/2023 12:24   DG Pelvis Portable  Result Date: 02/07/2023 CLINICAL DATA:  Fall. EXAM: PORTABLE PELVIS 1-2 VIEWS COMPARISON:  None Available. FINDINGS: There is no evidence of pelvic fracture or diastasis. No pelvic bone lesions are seen. IMPRESSION: Negative. Electronically Signed   By: Lupita Raider M.D.   On: 02/07/2023 12:05   DG Chest Port 1 View  Result Date: 02/07/2023 CLINICAL DATA:  Fall. EXAM: PORTABLE CHEST 1 VIEW COMPARISON:  November 19, 2021. FINDINGS: Mild cardiomegaly the heart size and mediastinal contours are within normal limits. Both lungs are clear. The visualized skeletal structures are unremarkable. IMPRESSION: No active disease. Electronically Signed   By: Lupita Raider M.D.   On: 02/07/2023 12:04    Review of Systems  HENT:  Negative for ear discharge, ear pain, hearing loss and tinnitus.   Eyes:  Negative for photophobia and pain.  Respiratory:  Negative for cough and shortness of breath.   Cardiovascular:  Negative for chest pain.  Gastrointestinal:  Negative for abdominal pain, nausea and vomiting.  Genitourinary:  Negative for dysuria, flank pain, frequency and urgency.  Musculoskeletal:  Positive for arthralgias (Left shoulder). Negative for back pain, myalgias and neck pain.  Neurological:  Negative for dizziness and headaches.  Hematological:  Does not bruise/bleed easily.  Psychiatric/Behavioral:  The patient is not nervous/anxious.    Blood pressure 131/76, pulse (!) 58, temperature 97.9 F (36.6 C),  temperature source Oral, resp. rate 15, height 5\' 3"  (1.6 m), weight 63.5 kg, SpO2 100 %. Physical Exam Constitutional:      General: She is not in acute distress.    Appearance: She is well-developed. She is not diaphoretic.  HENT:     Head: Normocephalic and atraumatic.  Eyes:     General: No scleral icterus.       Right eye: No discharge.        Left eye: No discharge.     Conjunctiva/sclera: Conjunctivae normal.  Cardiovascular:     Rate and Rhythm: Normal rate and regular rhythm.  Pulmonary:     Effort: Pulmonary effort is normal. No respiratory distress.  Musculoskeletal:  Cervical back: Normal range of motion.     Comments: Left shoulder, elbow, wrist, digits- no skin wounds, severe TTP shoulder, no instability, no blocks to motion  Sens  Ax/R/M/U intact  Mot   Ax/ R/ PIN/ M/ AIN/ U intact  Rad 2+  Skin:    General: Skin is warm and dry.  Neurological:     Mental Status: She is alert.  Psychiatric:        Mood and Affect: Mood normal.        Behavior: Behavior normal.     Assessment/Plan: Left humerus fx -- Plan non-operative treatment with sling and NWB. F/u with Dr. Carola Frost in 2 weeks.    Freeman Caldron, PA-C Orthopedic Surgery [phone] 02/07/2023, 2:20 PM

## 2023-02-07 NOTE — H&P (Signed)
History and Physical    Hailey [name] PPI:[id number] DOB: [birth year] DOA: 02/07/2023  PCP: Tresa Garter, MD (Confirm with patient/family/NH records and if not entered, this has to be entered at [facility] point of entry) Patient coming from: Home  I have personally briefly reviewed patient's old medical records in [facility]  Chief Complaint: I fell and broke my shoulder  HPI: Hailey [name] is a 88 y.o. female with medical history significant of HTN, A-fib on Eliquis, IIDM, direct neuropathy, remote history of BPV, anxiety/depression, presented with mechanical fall and right shoulder pain.  Patient was at home preparing luggage for a weekend trip, when she stepped on one family member's foot and lost her balance and feel on her right shoulder.  Denies any prodrome of lightheadedness palpitations chest pain shortness of breath.  No LOC.  She felt excruciating pain on her left shoulder right after the fall.  Denies any joint pain or muscle pain.  Denies any recent medication changes.  Lost about 5 pounds since last year and gained back 1 pound after PCP started her on Lexapro this year.  EMS arrived and found patient hypotensive and bradycardic and 300 mL IV fluid given en route to hospital. She has a remote history of BPV, but she denies any symptoms of vertigo before or during her fall today.  ED Course: Blood pressures remain borderline low 79/56 heart rate 40-lower 50s, patient was given further 1 L IV bolus and blood pressure improved to SBP 120s.  CT chest abdomen showed comminuted left femoral neck and head fracture, CT head and neck no significant fracture or dislocation.  Lactic acid 2.2, WBC 4.9 creatinine 1.2.  Review of Systems: As per HPI otherwise 14 point review of systems negative.    Past Medical History:  Diagnosis Date   Arthritis    Atrial fibrillation [facility])     The histories are not reviewed yet. Please review them in the "History" navigator section and refresh  this SmartLink.   reports that she has never smoked. She has never used smokeless tobacco. She reports that she does not drink alcohol and does not use drugs.  Not on File  No family history on file.   Prior to Admission medications   Medication Sig Start Date End Date Taking? Authorizing Provider  apixaban (ELIQUIS) 2.5 MG TABS tablet Take 2.5 mg by mouth 2 (two) times daily.    [provider]  atorvastatin (LIPITOR) 40 MG tablet Take 40 mg by mouth every Monday, Wednesday, and Friday.    [provider]  DULoxetine (CYMBALTA) 20 MG capsule Take 20 mg by mouth daily.    [provider]  empagliflozin (JARDIANCE) 10 MG TABS tablet Take 10 mg by mouth daily.    [provider]  escitalopram (LEXAPRO) 5 MG tablet Take 5 mg by mouth daily.    [provider]  esomeprazole (NEXIUM) 40 MG capsule Take 40 mg by mouth daily at 12 noon.    [provider]  fluorouracil (EFUDEX) 5 % cream Apply 1 Application topically daily. To precancers    [provider]  GABAPENTIN EX Apply 1 Application topically 3 (three) times daily as needed (To vulva). Gabapentin 6% cream    [provider]  LORazepam (ATIVAN) 1 MG tablet Take 1 mg by mouth 2 (two) times daily as needed for anxiety or sleep.    [provider]  losartan (COZAAR) 25 MG tablet Take 12.5 mg by mouth daily.  [provider]  meclizine (ANTIVERT) 25 MG tablet Take 25 mg by mouth 3 (three) times daily as needed for dizziness.    [provider]  metoprolol succinate (TOPROL-XL) 25 MG 24 hr tablet Take 12.5 mg by mouth daily.    [provider]  metroNIDAZOLE (FLAGYL) 250 MG tablet Take 250 mg by mouth 3 (three) times daily. Patient not taking: Reported on 02/07/2023    [provider]  nitroGLYCERIN (NITROSTAT) 0.4 MG SL tablet Place 0.4 mg under the tongue every 5 (five) minutes as needed for chest pain.    [provider]    Physical Exam: Vitals:   02/07/23 1400 02/07/23 1416 02/07/23 1430 02/07/23 1531  BP: 131/76  125/85   Pulse: (!) 58  (!) 57   Resp: 15  15   Temp:  97.9 F (36.6 C)  97.8 F (36.6 C)  TempSrc:  Oral  Oral  SpO2: 100%  100%   Weight:      Height:        Constitutional: NAD, calm, comfortable Vitals:   02/07/23 1400 02/07/23 1416 02/07/23 1430 02/07/23 1531  BP: 131/76  125/85   Pulse: (!) 58  (!) 57   Resp: 15  15   Temp:  97.9 F (36.6 C)  97.8 F (36.6 C)  TempSrc:  Oral  Oral  SpO2: 100%  100%   Weight:      Height:       Eyes: PERRL, lids and conjunctivae normal ENMT: Mucous membranes are moist. Posterior pharynx clear of any exudate or lesions.Normal dentition.  Neck: normal, supple, no masses, no thyromegaly Respiratory: clear to auscultation bilaterally, no wheezing, no crackles. Normal respiratory effort. No accessory muscle use.  Cardiovascular: Regular rate and rhythm, no murmurs / rubs / gallops. No extremity edema. 2+ pedal pulses. No carotid bruits.  Abdomen: no tenderness, no masses palpated. No hepatosplenomegaly. Bowel sounds positive.  Musculoskeletal: no clubbing / cyanosis. No joint deformity upper and lower extremities. Good ROM, no contractures. Normal muscle tone.  Skin: no rashes, lesions, ulcers. No induration Neurologic: CN 2-12 grossly intact. Sensation intact, DTR normal. Strength 5/5 in all 4.  Psychiatric: Normal judgment and insight. Alert and oriented x 3. Normal mood.     Labs on Admission: I have personally reviewed following labs and imaging studies  CBC: Recent Labs  Lab 02/07/23 1148  WBC 4.9  HGB 11.2*  10.9*  HCT 33.2*  32.0*  MCV 98.5  PLT 179   Basic Metabolic Panel: Recent Labs  Lab 02/07/23 1148  NA 135  136  K 4.1  4.2  CL 108  105  CO2 20*  GLUCOSE 144*  140*  BUN 24*  25*  CREATININE 1.23*  1.20*  CALCIUM 8.3*   GFR: Estimated Creatinine Clearance: 29.4 mL/min (A) (by C-G formula based on  SCr of 1.23 mg/dL (H)). Liver Function Tests: Recent Labs  Lab 02/07/23 1148  AST 22  ALT 12  ALKPHOS 46  BILITOT 0.6  PROT 5.3*  ALBUMIN 3.0*   No results for input(s): "LIPASE", "AMYLASE" in the last 168 hours. No results for input(s): "AMMONIA" in the last 168 hours. Coagulation Profile: Recent Labs  Lab 02/07/23 1148  INR 1.2   Cardiac Enzymes: No results for input(s): "CKTOTAL", "CKMB", "CKMBINDEX", "TROPONINI" in the last 168 hours. BNP (last 3 results) No results for input(s): "PROBNP" in the last 8760 hours. HbA1C: No results for input(s): "HGBA1C" in the last 72 hours. CBG: No  results for input(s): "GLUCAP" in the last 168 hours. Lipid Profile: No results for input(s): "CHOL", "HDL", "LDLCALC", "TRIG", "CHOLHDL", "LDLDIRECT" in the last 72 hours. Thyroid Function Tests: No results for input(s): "TSH", "T4TOTAL", "FREET4", "T3FREE", "THYROIDAB" in the last 72 hours. Anemia Panel: No results for input(s): "VITAMINB12", "FOLATE", "FERRITIN", "TIBC", "IRON", "RETICCTPCT" in the last 72 hours. Urine analysis:    Component Value Date/Time   COLORURINE YELLOW 02/07/2023 1145   APPEARANCEUR HAZY (A) 02/07/2023 1145   LABSPEC 1.036 (H) 02/07/2023 1145   PHURINE 5.0 02/07/2023 1145   GLUCOSEU >=500 (A) 02/07/2023 1145   HGBUR NEGATIVE 02/07/2023 1145   BILIRUBINUR NEGATIVE 02/07/2023 1145   KETONESUR NEGATIVE 02/07/2023 1145   PROTEINUR NEGATIVE 02/07/2023 1145   NITRITE NEGATIVE 02/07/2023 1145   LEUKOCYTESUR TRACE (A) 02/07/2023 1145    Radiological Exams on Admission: CT HEAD WO CONTRAST ( )  Result Date: 02/07/2023 CLINICAL DATA:  Head trauma, minor (Age >= 65y); Neck trauma (Age >= 65y) EXAM: CT HEAD WITHOUT CONTRAST CT CERVICAL SPINE WITHOUT CONTRAST TECHNIQUE: Multidetector CT imaging of the head and cervical spine was performed following the standard protocol without intravenous contrast. Multiplanar CT image reconstructions of the cervical spine were  also generated. RADIATION DOSE REDUCTION: This exam was performed according to the departmental dose-optimization program which includes automated exposure control, adjustment of the mA and/or kV according to patient size and/or use of iterative reconstruction technique. COMPARISON:  MR Head 04/25/22, CT C Spine 04/24/22 FINDINGS: CT HEAD FINDINGS Brain: No evidence of acute infarction, hemorrhage, hydrocephalus, extra-axial collection or mass lesion/mass effect. There is sequela of moderate chronic microvascular ischemic change with a chronic infarct in the left parietal lobe in the left frontal lobe Vascular: No hyperdense vessel or unexpected calcification. Skull: Normal. Negative for fracture or focal lesion. Sinuses/Orbits: No middle ear or mastoid effusion. Paranasal sinuses are notable for mucosal thickening left maxillary sinus. Bilateral lens replacement. Orbits are otherwise unremarkable Other: None. CT CERVICAL SPINE FINDINGS Alignment: Normal. Skull base and vertebrae: No acute fracture. No primary bone lesion or focal pathologic process. Soft tissues and spinal canal: No prevertebral fluid or swelling. No visible canal hematoma. Hyperdense region in the left aspect of the spinal canal at the C3-C4 level (series 9, image 38) correlates with the portion of the left vertebral artery that courses through the spinal canal. Disc levels:  No evidence of high-grade spinal canal stenosis. Upper chest: Negative. Other: None IMPRESSION: 1. No acute intracranial abnormality. 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 2. No acute fracture or traumatic subluxation of the cervical spine. 3. Redemonstrated tortuous left vertebral artery with the V2 segment coursing through the left aspect of the spinal canal. Findings were discussed with Dr. Laurell Josephs on 02/07/23 at 12:15 PM Electronically Signed   By: Lorenza Cambridge M.D.   On: 02/07/2023 12:29   CT  CERVICAL SPINE WO CONTRAST  Result Date: 02/07/2023 CLINICAL DATA:  Head trauma, minor (Age >= 65y); Neck trauma (Age >= 65y) EXAM: CT HEAD WITHOUT CONTRAST CT CERVICAL SPINE WITHOUT CONTRAST TECHNIQUE: Multidetector CT imaging of the head and cervical spine was performed following the standard protocol without intravenous contrast. Multiplanar CT image reconstructions of the cervical spine were also generated. RADIATION DOSE REDUCTION: This exam was performed according to the departmental dose-optimization program which includes automated exposure control, adjustment of the mA and/or kV according to patient size and/or use of iterative reconstruction technique. COMPARISON:  MR Head 04/25/22,  CT C Spine 04/24/22 FINDINGS: CT HEAD FINDINGS Brain: No evidence of acute infarction, hemorrhage, hydrocephalus, extra-axial collection or mass lesion/mass effect. There is sequela of moderate chronic microvascular ischemic change with a chronic infarct in the left parietal lobe in the left frontal lobe Vascular: No hyperdense vessel or unexpected calcification. Skull: Normal. Negative for fracture or focal lesion. Sinuses/Orbits: No middle ear or mastoid effusion. Paranasal sinuses are notable for mucosal thickening left maxillary sinus. Bilateral lens replacement. Orbits are otherwise unremarkable Other: None. CT CERVICAL SPINE FINDINGS Alignment: Normal. Skull base and vertebrae: No acute fracture. No primary bone lesion or focal pathologic process. Soft tissues and spinal canal: No prevertebral fluid or swelling. No visible canal hematoma. Hyperdense region in the left aspect of the spinal canal at the C3-C4 level (series 9, image 38) correlates with the portion of the left vertebral artery that courses through the spinal canal. Disc levels:  No evidence of high-grade spinal canal stenosis. Upper chest: Negative. Other: None IMPRESSION: 1. No acute intracranial abnormality. Sequela of moderate chronic microvascular  ischemic change with chronic infarcts in the left frontal and parietal lobes. 2. No acute fracture or traumatic subluxation of the cervical spine. 3. Redemonstrated tortuous left vertebral artery with the V2 segment coursing through the left aspect of the spinal canal. Findings were discussed with Dr. Laurell Josephs on 02/07/23 at 12:15 PM Electronically Signed   By: Lorenza Cambridge M.D.   On: 02/07/2023 12:29   CT CHEST ABDOMEN PELVIS W CONTRAST  Result Date: 02/07/2023 CLINICAL DATA:  Fall. EXAM: CT CHEST, ABDOMEN, AND PELVIS WITH CONTRAST TECHNIQUE: Multidetector CT imaging of the chest, abdomen and pelvis was performed following the standard protocol during bolus administration of intravenous contrast. RADIATION DOSE REDUCTION: This exam was performed according to the departmental dose-optimization program which includes automated exposure control, adjustment of the mA and/or kV according to patient size and/or use of iterative reconstruction technique. CONTRAST:  Unspecified. COMPARISON:  November 19, 2021. FINDINGS: CT CHEST FINDINGS Cardiovascular: 4.4 cm ascending thoracic aortic aneurysm is noted. No dissection is noted. Normal cardiac size. No pericardial effusion. Coronary artery calcifications are noted. Mediastinum/Nodes: Thyroid gland and esophagus are unremarkable. Calcified right paratracheal adenopathy is noted consistent with prior granulomatous disease. Lungs/Pleura: No pneumothorax or pleural effusion is noted. Minimal bilateral posterior basilar subsegmental atelectasis. 4 mm nodule noted in right middle lobe best seen on image number 96 of series 4. Musculoskeletal: Moderately displaced and comminuted proximal left humeral head and neck fracture is noted. CT ABDOMEN PELVIS FINDINGS Hepatobiliary: No focal liver abnormality is seen. No gallstones, gallbladder wall thickening, or biliary dilatation. Pancreas: Fatty replacement of pancreas is noted. Spleen: Normal in size without focal abnormality.  Adrenals/Urinary Tract: Adrenal glands appear normal. No hydronephrosis or renal obstruction is noted. Urinary bladder is unremarkable. Stomach/Bowel: Stomach appears normal. Diverticulosis of descending and sigmoid colon is noted without inflammation. There is no evidence of bowel obstruction. Vascular/Lymphatic: Aortic atherosclerosis. No enlarged abdominal or pelvic lymph nodes. Reproductive: Status post hysterectomy. No adnexal masses. Other: No abdominal wall hernia or abnormality. No abdominopelvic ascites. Musculoskeletal: Old T10 compression fracture is noted. No acute osseous abnormality is noted. IMPRESSION: Moderately displaced and comminuted proximal left humeral head and neck fracture. No other definite acute traumatic injury is noted in the chest, abdomen or pelvis. 4.4 cm ascending thoracic aortic aneurysm. Recommend annual imaging followup by CTA or MRA. This recommendation follows 2010 ACCF/AHA/AATS/ACR/ASA/SCA/SCAI/SIR/STS/SVM Guidelines for the Diagnosis and Management of Patients with Thoracic Aortic Disease. Circulation. 2010; 121: Z610-R604.  Aortic aneurysm NOS (ICD10-I71.9). 4 mm right middle lobe nodule. No follow-up needed if patient is low-risk.This recommendation follows the consensus statement: Guidelines for Management of Incidental Pulmonary Nodules Detected on CT Images: From the Fleischner Society 2017; Radiology 2017; 284:228-243. Diverticulosis of descending and sigmoid colon without inflammation. Old T10 compression fracture is noted. Coronary artery calcifications are noted. Aortic Atherosclerosis (ICD10-I70.0). These results were discussed in person at the time of interpretation on 02/07/2023 at 12:10 pm to provider Violeta Gelinas. MD, who verbally acknowledged these results. Electronically Signed   By: Lupita Raider M.D.   On: 02/07/2023 12:24   DG Pelvis Portable  Result Date: 02/07/2023 CLINICAL DATA:  Fall. EXAM: PORTABLE PELVIS 1-2 VIEWS COMPARISON:  None Available.  FINDINGS: There is no evidence of pelvic fracture or diastasis. No pelvic bone lesions are seen. IMPRESSION: Negative. Electronically Signed   By: Lupita Raider M.D.   On: 02/07/2023 12:05   DG Chest Port 1 View  Result Date: 02/07/2023 CLINICAL DATA:  Fall. EXAM: PORTABLE CHEST 1 VIEW COMPARISON:  November 19, 2021. FINDINGS: Mild cardiomegaly the heart size and mediastinal contours are within normal limits. Both lungs are clear. The visualized skeletal structures are unremarkable. IMPRESSION: No active disease. Electronically Signed   By: Lupita Raider M.D.   On: 02/07/2023 12:04    EKG: Pending  Assessment/Plan Principal Problem:   Fall Active Problems:   Closed comminuted fracture of left humerus   Bradycardia   Hypotension   Chronic a-fib (HCC)  (please populate well all problems here in Problem List. (For example, if patient is on BP meds at home and you resume or decide to hold them, it is a problem that needs to be her. Same for CAD, COPD, HLD and so on)  Mechanical fall and left humeral neck and head fracture -Patient was evaluated by orthopedic surgery PA in the ED, was considered to be a noncandidate for surgical intervention.  Patient will be put on left shoulder sling and initiate PT evaluation in AM -For pain control, alternate with oxycodone and Dilaudid -Other Ddx, symptoms and signs not compatible with syncope, patient has been on Jardiance since last year and denies any history of hypoglycemia or hypoglycemia related symptoms such as sweating nauseous palpitation.  She has a history of PAF denies any palpitations and reported that her PAF has been controlled. UA showed no UTI.  Hypotension -Probably related to symptomatic bradycardia.  Metoprolol discontinued by cardiology. -Will also hold off losartan -Start as needed albuterol  Bradycardia -Likely related to beta-blocker -Metoprolol discontinued  PAF -In sinus rhythm, hold on metoprolol, continue  Eliquis  IIDM -Glucose 140, continue Jardiance  DVT prophylaxis: Eliquis Code Status: Full code Family Communication: Husband at bedside Disposition Plan: Expect less than 2 midnight hospital stay Consults called: Cardiology, Ortho Admission status: Tele obs   Emeline General MD [facility] Pager [phone]  02/07/2023, 5:19 PM

## 2023-02-07 NOTE — ED Provider Notes (Signed)
[place] [facility]MENT AT [facility] Provider Note   CSN: [id number] Arrival date & time: 02/07/23  1131     History  Chief Complaint  Patient presents with  . Level 1 Trauma    Hailey [name] is a 88 y.o. female.  Patient is an 88 year old female with a past medical history of A-fib on Eliquis and metoprolol, hypertension, bradycardia presenting to the emergency department after a fall.  Patient states that she was getting ready to go to the beach when she tripped and fell and landed on her left shoulder.  She denies hitting her head or losing consciousness.  EMS reports when they initially arrived her blood pressure was initially stable and then she became clammy and diaphoretic and repeat blood pressure was in the 60s systolic.  They gave her fluids and route with improvement to the 80s systolic.  The patient denied any dizziness prior to the fall or any chest pain or shortness of breath.  She denies any recent nausea, vomiting or diarrhea.  The history is provided by the patient and the EMS personnel.       Home Medications Prior to Admission medications   Medication Sig Start Date End Date Taking? Authorizing Provider  metoprolol succinate (TOPROL-XL) 25 MG 24 hr tablet Take 12.5 mg by mouth daily.   Yes [provider]  GABAPENTIN EX Apply 1 Application topically 3 (three) times daily. To vulva    [provider]  metroNIDAZOLE (FLAGYL) 250 MG tablet Take 250 mg by mouth 3 (three) times daily. Patient not taking: Reported on 02/07/2023    [provider]      Allergies    Patient has no allergy information on record.    Review of Systems   Review of Systems  Physical Exam Updated Vital Signs BP 104/78   Pulse (!) 53   Temp 97.6 F (36.4 C) (Oral)   Resp 13   Ht 5\' 3"  (1.6 m)   Wt 63.5 kg   SpO2 95%   BMI 24.80 kg/m  Physical Exam Vitals and nursing note reviewed.  Constitutional:      General: She is not in  acute distress.    Appearance: Normal appearance.  HENT:     Head: Normocephalic and atraumatic.     Nose: Nose normal.     Mouth/Throat:     Mouth: Mucous membranes are moist.     Pharynx: Oropharynx is clear.  Eyes:     Extraocular Movements: Extraocular movements intact.     Conjunctiva/sclera: Conjunctivae normal.     Pupils: Pupils are equal, round, and reactive to light.  Neck:     Comments: No midline neck tenderness, c-collar in place Cardiovascular:     Rate and Rhythm: Normal rate and regular rhythm.     Pulses: Normal pulses.     Heart sounds: Normal heart sounds.  Pulmonary:     Effort: Pulmonary effort is normal.     Breath sounds: Normal breath sounds.  Abdominal:     General: Abdomen is flat.     Palpations: Abdomen is soft.     Tenderness: There is no abdominal tenderness.  Musculoskeletal:     Comments: Tenderness with deformity to L shoulder No bony tenderness to RUE or bilateral LE No midline back tenderness Pelvis stable, non-tender  Skin:    General: Skin is warm and dry.  Neurological:     General: No focal deficit present.     Mental Status: She  is alert and oriented to person, place, and time.  Psychiatric:        Mood and Affect: Mood normal.        Behavior: Behavior normal.     ED Results / Procedures / Treatments   Labs (all labs ordered are listed, but only abnormal results are displayed) Labs Reviewed  COMPREHENSIVE METABOLIC PANEL - Abnormal; Notable for the following components:      Result Value   CO2 20 (*)    Glucose, Bld 144 (*)    BUN 24 (*)    Creatinine, Ser 1.23 (*)    Calcium 8.3 (*)    Total Protein 5.3 (*)    Albumin 3.0 (*)    GFR, Estimated 43 (*)    All other components within normal limits  CBC - Abnormal; Notable for the following components:   RBC 3.37 (*)    Hemoglobin 11.2 (*)    HCT 33.2 (*)    All other components within normal limits  LACTIC ACID, PLASMA - Abnormal; Notable for the following components:    Lactic Acid, Venous 2.2 (*)    All other components within normal limits  PROTIME-INR - Abnormal; Notable for the following components:   Prothrombin Time 15.3 (*)    All other components within normal limits  I-STAT CHEM 8, ED - Abnormal; Notable for the following components:   BUN 25 (*)    Creatinine, Ser 1.20 (*)    Glucose, Bld 140 (*)    Hemoglobin 10.9 (*)    HCT 32.0 (*)    All other components within normal limits  ETHANOL  URINALYSIS, ROUTINE W REFLEX MICROSCOPIC  LACTIC ACID, PLASMA  LACTIC ACID, PLASMA  SAMPLE TO BLOOD BANK    EKG None  Radiology CT HEAD WO CONTRAST ( )  Result Date: 02/07/2023 CLINICAL DATA:  Head trauma, minor (Age >= 65y); Neck trauma (Age >= 65y) EXAM: CT HEAD WITHOUT CONTRAST CT CERVICAL SPINE WITHOUT CONTRAST TECHNIQUE: Multidetector CT imaging of the head and cervical spine was performed following the standard protocol without intravenous contrast. Multiplanar CT image reconstructions of the cervical spine were also generated. RADIATION DOSE REDUCTION: This exam was performed according to the departmental dose-optimization program which includes automated exposure control, adjustment of the mA and/or kV according to patient size and/or use of iterative reconstruction technique. COMPARISON:  MR Head 04/25/22, CT C Spine 04/24/22 FINDINGS: CT HEAD FINDINGS Brain: No evidence of acute infarction, hemorrhage, hydrocephalus, extra-axial collection or mass lesion/mass effect. There is sequela of moderate chronic microvascular ischemic change with a chronic infarct in the left parietal lobe in the left frontal lobe Vascular: No hyperdense vessel or unexpected calcification. Skull: Normal. Negative for fracture or focal lesion. Sinuses/Orbits: No middle ear or mastoid effusion. Paranasal sinuses are notable for mucosal thickening left maxillary sinus. Bilateral lens replacement. Orbits are otherwise unremarkable Other: None. CT CERVICAL SPINE FINDINGS  Alignment: Normal. Skull base and vertebrae: No acute fracture. No primary bone lesion or focal pathologic process. Soft tissues and spinal canal: No prevertebral fluid or swelling. No visible canal hematoma. Hyperdense region in the left aspect of the spinal canal at the C3-C4 level (series 9, image 38) correlates with the portion of the left vertebral artery that courses through the spinal canal. Disc levels:  No evidence of high-grade spinal canal stenosis. Upper chest: Negative. Other: None IMPRESSION: 1. No acute intracranial abnormality. 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 2.  No acute fracture or traumatic subluxation of the cervical spine. 3. Redemonstrated tortuous left vertebral artery with the V2 segment coursing through the left aspect of the spinal canal. Findings were discussed with Dr. Laurell Josephs on 02/07/23 at 12:15 PM Electronically Signed   By: Lorenza Cambridge M.D.   On: 02/07/2023 12:29   CT CERVICAL SPINE WO CONTRAST  Result Date: 02/07/2023 CLINICAL DATA:  Head trauma, minor (Age >= 65y); Neck trauma (Age >= 65y) EXAM: CT HEAD WITHOUT CONTRAST CT CERVICAL SPINE WITHOUT CONTRAST TECHNIQUE: Multidetector CT imaging of the head and cervical spine was performed following the standard protocol without intravenous contrast. Multiplanar CT image reconstructions of the cervical spine were also generated. RADIATION DOSE REDUCTION: This exam was performed according to the departmental dose-optimization program which includes automated exposure control, adjustment of the mA and/or kV according to patient size and/or use of iterative reconstruction technique. COMPARISON:  MR Head 04/25/22, CT C Spine 04/24/22 FINDINGS: CT HEAD FINDINGS Brain: No evidence of acute infarction, hemorrhage, hydrocephalus, extra-axial collection or mass lesion/mass effect. There is sequela of moderate chronic microvascular ischemic change with a chronic  infarct in the left parietal lobe in the left frontal lobe Vascular: No hyperdense vessel or unexpected calcification. Skull: Normal. Negative for fracture or focal lesion. Sinuses/Orbits: No middle ear or mastoid effusion. Paranasal sinuses are notable for mucosal thickening left maxillary sinus. Bilateral lens replacement. Orbits are otherwise unremarkable Other: None. CT CERVICAL SPINE FINDINGS Alignment: Normal. Skull base and vertebrae: No acute fracture. No primary bone lesion or focal pathologic process. Soft tissues and spinal canal: No prevertebral fluid or swelling. No visible canal hematoma. Hyperdense region in the left aspect of the spinal canal at the C3-C4 level (series 9, image 38) correlates with the portion of the left vertebral artery that courses through the spinal canal. Disc levels:  No evidence of high-grade spinal canal stenosis. Upper chest: Negative. Other: None IMPRESSION: 1. No acute intracranial abnormality. 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 2. No acute fracture or traumatic subluxation of the cervical spine. 3. Redemonstrated tortuous left vertebral artery with the V2 segment coursing through the left aspect of the spinal canal. Findings were discussed with Dr. Laurell Josephs on 02/07/23 at 12:15 PM Electronically Signed   By: Lorenza Cambridge M.D.   On: 02/07/2023 12:29   CT CHEST ABDOMEN PELVIS W CONTRAST  Result Date: 02/07/2023 CLINICAL DATA:  Fall. EXAM: CT CHEST, ABDOMEN, AND PELVIS WITH CONTRAST TECHNIQUE: Multidetector CT imaging of the chest, abdomen and pelvis was performed following the standard protocol during bolus administration of intravenous contrast. RADIATION DOSE REDUCTION: This exam was performed according to the departmental dose-optimization program which includes automated exposure control, adjustment of the mA and/or kV according to patient size and/or use of iterative reconstruction  technique. CONTRAST:  Unspecified. COMPARISON:  November 19, 2021. FINDINGS: CT CHEST FINDINGS Cardiovascular: 4.4 cm ascending thoracic aortic aneurysm is noted. No dissection is noted. Normal cardiac size. No pericardial effusion. Coronary artery calcifications are noted. Mediastinum/Nodes: Thyroid gland and esophagus are unremarkable. Calcified right paratracheal adenopathy is noted consistent with prior granulomatous disease. Lungs/Pleura: No pneumothorax or pleural effusion is noted. Minimal bilateral posterior basilar subsegmental atelectasis. 4 mm nodule noted in right middle lobe best seen on image number 96 of series 4. Musculoskeletal: Moderately displaced and comminuted proximal left humeral head and neck fracture is noted. CT ABDOMEN PELVIS FINDINGS Hepatobiliary: No focal liver abnormality is seen. No gallstones, gallbladder  wall thickening, or biliary dilatation. Pancreas: Fatty replacement of pancreas is noted. Spleen: Normal in size without focal abnormality. Adrenals/Urinary Tract: Adrenal glands appear normal. No hydronephrosis or renal obstruction is noted. Urinary bladder is unremarkable. Stomach/Bowel: Stomach appears normal. Diverticulosis of descending and sigmoid colon is noted without inflammation. There is no evidence of bowel obstruction. Vascular/Lymphatic: Aortic atherosclerosis. No enlarged abdominal or pelvic lymph nodes. Reproductive: Status post hysterectomy. No adnexal masses. Other: No abdominal wall hernia or abnormality. No abdominopelvic ascites. Musculoskeletal: Old T10 compression fracture is noted. No acute osseous abnormality is noted. IMPRESSION: Moderately displaced and comminuted proximal left humeral head and neck fracture. No other definite acute traumatic injury is noted in the chest, abdomen or pelvis. 4.4 cm ascending thoracic aortic aneurysm. Recommend annual imaging followup by CTA or MRA. This recommendation follows 2010 ACCF/AHA/AATS/ACR/ASA/SCA/SCAI/SIR/STS/SVM  Guidelines for the Diagnosis and Management of Patients with Thoracic Aortic Disease. Circulation. 2010; 121: J478-G956. Aortic aneurysm NOS (ICD10-I71.9). 4 mm right middle lobe nodule. No follow-up needed if patient is low-risk.This recommendation follows the consensus statement: Guidelines for Management of Incidental Pulmonary Nodules Detected on CT Images: From the Fleischner Society 2017; Radiology 2017; 284:228-243. Diverticulosis of descending and sigmoid colon without inflammation. Old T10 compression fracture is noted. Coronary artery calcifications are noted. Aortic Atherosclerosis (ICD10-I70.0). These results were discussed in person at the time of interpretation on 02/07/2023 at 12:10 pm to provider Violeta Gelinas. MD, who verbally acknowledged these results. Electronically Signed   By: Lupita Raider M.D.   On: 02/07/2023 12:24   DG Pelvis Portable  Result Date: 02/07/2023 CLINICAL DATA:  Fall. EXAM: PORTABLE PELVIS 1-2 VIEWS COMPARISON:  None Available. FINDINGS: There is no evidence of pelvic fracture or diastasis. No pelvic bone lesions are seen. IMPRESSION: Negative. Electronically Signed   By: Lupita Raider M.D.   On: 02/07/2023 12:05   DG Chest Port 1 View  Result Date: 02/07/2023 CLINICAL DATA:  Fall. EXAM: PORTABLE CHEST 1 VIEW COMPARISON:  November 19, 2021. FINDINGS: Mild cardiomegaly the heart size and mediastinal contours are within normal limits. Both lungs are clear. The visualized skeletal structures are unremarkable. IMPRESSION: No active disease. Electronically Signed   By: Lupita Raider M.D.   On: 02/07/2023 12:04    Procedures .Critical Care  Performed by: Rexford Maus, DO Authorized by: Rexford Maus, DO   Critical care provider statement:    Critical care time (minutes):  40   Critical care time was exclusive of:  Separately billable procedures and treating other patients   Critical care was necessary to treat or prevent imminent or  life-threatening deterioration of the following conditions:  Circulatory failure and trauma   Critical care was time spent personally by me on the following activities:  Development of treatment plan with patient or surrogate, discussions with consultants, discussions with primary provider, evaluation of patient's response to treatment, examination of patient, obtaining history from patient or surrogate, ordering and review of laboratory studies, ordering and performing treatments and interventions, ordering and review of radiographic studies, pulse oximetry, re-evaluation of patient's condition and review of old charts   I assumed direction of critical care for this patient from another provider in my specialty: no       Medications Ordered in ED Medications  0.9 %  sodium chloride infusion (0 mLs Intravenous Stopped 02/07/23 1246)  oxyCODONE-acetaminophen (PERCOCET/ROXICET) 5-325 MG per tablet 1 tablet (has no administration in time range)  iohexol (OMNIPAQUE) 350 MG/ML injection 100 mL (100 mLs  Intravenous Contrast Given 02/07/23 1207)  fentaNYL (SUBLIMAZE) injection 50 mcg (50 mcg Intravenous Given 02/07/23 1250)    ED Course/ Medical Decision Making/ A&P Clinical Course as of 02/07/23 1405  Thu Feb 07, 2023  1256 Imaging with L humeral neck fracture, no other traumatic injury seen. Incidentally showed ascending thoracic aneurysm that will require outpatient follow up. Due to patient's hypotension and bradycardia with the fall, recommended hospitalist admission by trauma. Trauma consulted orthopedics who will follow for the shoulder injury. [VK]  1404 I spoke with cardiology who will evaluate the patient to give medication recommendations. [VK]    Clinical Course User Index [VK] Rexford Maus, DO                             Medical Decision Making This patient presents to the ED with chief complaint(s) of fall with pertinent past medical history of a fib on Eliquis, HTN which  further complicates the presenting complaint. The complaint involves an extensive differential diagnosis and also carries with it a high risk of complications and morbidity.    The differential diagnosis includes ICH, mass effect, cervical spine fracture, shoulder fracture considering blunt thoracic or abdominal trauma, dehydration, electrolyte abnormality, medication side effect, internal hemorrhage, no evidence of external hemorrhage  Additional history obtained: Additional history obtained from EMS  Records reviewed outpatient cardiology records  ED Course and Reassessment: On patient's arrival to the emergency department she was initially hypotensive and was made a level 1 trauma.  Trauma surgery and myself were immediately present at bedside on her arrival.  Airway and breathing were intact with mildly low pressures in the 70s to 90s on arrival.  IV access was obtained and she was started on a liter of IV fluids.  Secondary survey was significant for left shoulder tenderness with deformity, no other traumatic injuries seen on exam.  FAST exam performed by trauma showed no evidence of free fluid.  The patient was placed in a c-collar on arrival, chest and pelvis x-ray was performed that showed no evidence of large pneumothorax or pelvic fracture and was transported to CT scan to further evaluate for acute traumatic injury.  Independent labs interpretation:  The following labs were independently interpreted: Mildly elevated lactate, labs otherwise within normal range  Independent visualization of imaging: - I independently visualized the following imaging with scope of interpretation limited to determining acute life threatening conditions related to emergency care: Chest x-ray, pelvis x-ray, CT head/C-spine/chest abdomen and pelvis, which revealed left humeral neck fracture no other acute traumatic injuries, incidentally showed thoracic ascending aorta aneurysm  Consultation: - Consulted or  discussed management/test interpretation w/ external professional: Trauma, orthopedics, cardiology, hospitalists  Consideration for admission or further workup: Patient requires admission for further management and monitoring of her hypotension in the setting of her fall Social Determinants of health: N/A    Amount and/or Complexity of Data Reviewed Labs: ordered.  Risk Prescription drug management. Decision regarding hospitalization.          Final Clinical Impression(s) / ED Diagnoses Final diagnoses:  Fall, initial encounter  Closed nondisplaced fracture of surgical neck of left humerus, unspecified fracture morphology, initial encounter  Hypotension, unspecified hypotension type  Aneurysm of ascending aorta without rupture [facility])    Rx / DC Orders ED Discharge Orders     None         Rexford Maus, DO 02/07/23 1347

## 2023-02-07 NOTE — ED Notes (Addendum)
Pt arrived in ED at 1130 as a Level 1 trauma after sustaining a fall at home landing onto carpet flooring. Pt reports she tripped on her housekeeper and landed to her left side. Pt has deformity to left shoulder and skin tear to left leg. Alert and oriented x 4. Denies hitting her head. Denies LOC. Pt is on Eliquis for afib. EMS reports initial bp was 60/30. Pt was clammy and pale. EMS gave 300cc bolus NS and Fentanyl en route. C-collar in place which was changed into a [place] J upon arrival in ED with spinal precautions. Last bp taken by EMS was 88/64 prior to arrival. 1L NS bolus initiated at this time. Cardiac monitoring in place.

## 2023-02-07 NOTE — ED Notes (Signed)
Pt noted to have bradycardia in the 30s. Reports she took her metoprolol this AM. EKG done and given to Dr.Kingsley.

## 2023-02-08 DIAGNOSIS — W19XXXD Unspecified fall, subsequent encounter: Secondary | ICD-10-CM | POA: Diagnosis not present

## 2023-02-08 DIAGNOSIS — S42215D Unspecified nondisplaced fracture of surgical neck of left humerus, subsequent encounter for fracture with routine healing: Secondary | ICD-10-CM | POA: Diagnosis not present

## 2023-02-08 DIAGNOSIS — S42215A Unspecified nondisplaced fracture of surgical neck of left humerus, initial encounter for closed fracture: Secondary | ICD-10-CM | POA: Diagnosis not present

## 2023-02-08 DIAGNOSIS — I959 Hypotension, unspecified: Secondary | ICD-10-CM | POA: Diagnosis not present

## 2023-02-08 LAB — CBC
HCT: 35.9 % — ABNORMAL LOW (ref 36.0–46.0)
Hemoglobin: 11.7 g/dL — ABNORMAL LOW (ref 12.0–15.0)
MCH: 32.1 pg (ref 26.0–34.0)
MCHC: 32.6 g/dL (ref 30.0–36.0)
MCV: 98.4 fL (ref 80.0–100.0)
Platelets: 230 10*3/uL (ref 150–400)
RBC: 3.65 MIL/uL — ABNORMAL LOW (ref 3.87–5.11)
RDW: 14.6 % (ref 11.5–15.5)
WBC: 8.2 10*3/uL (ref 4.0–10.5)
nRBC: 0 % (ref 0.0–0.2)

## 2023-02-08 LAB — BASIC METABOLIC PANEL
Anion gap: 12 (ref 5–15)
BUN: 18 mg/dL (ref 8–23)
CO2: 20 mmol/L — ABNORMAL LOW (ref 22–32)
Calcium: 8.7 mg/dL — ABNORMAL LOW (ref 8.9–10.3)
Chloride: 102 mmol/L (ref 98–111)
Creatinine, Ser: 1.22 mg/dL — ABNORMAL HIGH (ref 0.44–1.00)
GFR, Estimated: 43 mL/min — ABNORMAL LOW (ref >60)
Glucose, Bld: 100 mg/dL — ABNORMAL HIGH (ref 70–99)
Potassium: 3.9 mmol/L (ref 3.5–5.1)
Sodium: 134 mmol/L — ABNORMAL LOW (ref 135–145)

## 2023-02-08 LAB — GLUCOSE, CAPILLARY: Glucose-Capillary: 89 mg/dL (ref 70–99)

## 2023-02-08 MED ORDER — ESCITALOPRAM OXALATE 10 MG PO TABS: 5.0000 mg | ORAL_TABLET | Freq: Every evening | ORAL | Status: DC

## 2023-02-08 MED ORDER — ACETAMINOPHEN 500 MG PO TABS
1000.0000 mg | ORAL_TABLET | Freq: Three times a day (TID) | ORAL | Status: DC
  Administered 2023-02-08 – 2023-02-09 (×4): 1000 mg via ORAL
  Filled 2023-02-08 (×4): qty 2

## 2023-02-08 MED ORDER — ESCITALOPRAM OXALATE 10 MG PO TABS
5.0000 mg | ORAL_TABLET | Freq: Every evening | ORAL | Status: DC
  Administered 2023-02-08: 5 mg via ORAL
  Filled 2023-02-08: qty 1

## 2023-02-08 MED ORDER — METHOCARBAMOL 500 MG PO TABS
500.0000 mg | ORAL_TABLET | Freq: Three times a day (TID) | ORAL | Status: DC
  Administered 2023-02-08 – 2023-02-09 (×4): 500 mg via ORAL
  Filled 2023-02-08 (×4): qty 1

## 2023-02-08 MED ORDER — LORAZEPAM 1 MG PO TABS
2.0000 mg | ORAL_TABLET | Freq: Every day | ORAL | Status: DC
  Administered 2023-02-08: 2 mg via ORAL
  Filled 2023-02-08: qty 2

## 2023-02-08 NOTE — Progress Notes (Addendum)
Physical Therapy Evaluation Patient Details Name: Hailey [name] MRN: [id number] DOB: 09-02-1935 Today's Date: 02/08/2023  History of Present Illness  88 yo female with hx of falls was admitted 5/30 for trip injury and landed on L shoulder.  Has left humeral head and neck fracture, non surgical management. Was found to be hypotensive and bradycardic initially.  PMHx:  stroke, anemia, permanent a-fib, CHF, DM, ascending aortic aneurysm, atherosclerosis, T10 compression fracture, diverticulosis,  Clinical Impression  Pt was seen for initial walk with HHA used for safety, pt is feeling some effects of pain meds but also has been off her feet a couple days.  Sling was applied with good management of discomfort, and agreed to leave it on for now.  Pt is requested to  have HHPT and OT and per case management may get self care assistance from OT as well.  Follow along with her to progress to more independence and attempt a cane next visit to see how she tolerates and manages with it.  BP as follows:  supine 104/66 (79) sat 94% pulse 62; sitting 118/77 (91) pulse 69, sat 95%;  standing 117/72 (86) pulse 65 sat 96%;  post gait 105/70 (81) pulse 76 and sat 96%.      Recommendations for follow up therapy are one component of a multi-disciplinary discharge planning process, led by the attending physician.  Recommendations may be updated based on patient status, additional functional criteria and insurance authorization.  Follow Up Recommendations       Assistance Recommended at Discharge Frequent or constant Supervision/Assistance  Patient can return home with the following  A little help with walking and/or transfers;A little help with bathing/dressing/bathroom;Assistance with cooking/housework;Assist for transportation;Help with stairs or ramp for entrance    Equipment Recommendations None recommended by PT  Recommendations for Other Services       Functional Status Assessment Patient has had a  recent decline in their functional status and demonstrates the ability to make significant improvements in function in a reasonable and predictable amount of time.     Precautions / Restrictions Precautions Precautions: Fall;Other (comment) Precaution Comments: LUE humeral fracture Required Braces or Orthoses: Sling Restrictions Weight Bearing Restrictions: Yes LUE Weight Bearing: Non weight bearing Other Position/Activity Restrictions: wear LUE sling at all times      Mobility  Bed Mobility Overal bed mobility: Needs Assistance Bed Mobility: Supine to Sit, Sit to Supine     Supine to sit: Min guard Sit to supine: Min guard   General bed mobility comments: min guard with pt demonstrating a good handle on how to support and move    Transfers Overall transfer level: Needs assistance Equipment used: None Transfers: Sit to/from Stand Sit to Stand: Min guard           General transfer comment: min guard to avoid unsteadiness initially    Ambulation/Gait Ambulation/Gait assistance: Min guard, Min assist Gait Distance (Feet): 140 Feet Assistive device: 1 person hand held assist   Gait velocity: variable Gait velocity interpretation: <1.31 ft/sec, indicative of household ambulator Pre-gait activities: standing balance and posture ck General Gait Details: pt is walking slowly with minor control of balance needed, has both lateral and posterior LOB with gait and with LUE in sling cannot recapture without contact  Stairs            Wheelchair Mobility    Modified Rankin (Stroke Patients Only)       Balance Overall balance assessment: Needs assistance, History of Falls Sitting-balance support:  Feet supported Sitting balance-Leahy Scale: Fair     Standing balance support: Single extremity supported Standing balance-Leahy Scale: Fair Standing balance comment: less than fair dynamically                             Pertinent Vitals/Pain Pain  Assessment Pain Assessment: Faces Faces Pain Scale: Hurts a little bit Pain Location: L arm with sling Pain Descriptors / Indicators: Guarding Pain Intervention(s): Limited activity within patient's tolerance, Monitored during session, Premedicated before session, Repositioned    Home Living Family/patient expects to be discharged to:: Private residence Living Arrangements: Spouse/significant other Available Help at Discharge: Family;Available 24 hours/day Type of Home: House Home Access: Stairs to enter Entrance Stairs-Rails: None Entrance Stairs-Number of Steps: 1 threshhold Alternate Level Stairs-Number of Steps: flight Home Layout: Two level;Able to live on main level with bedroom/bathroom Home Equipment: Rolling Walker (2 wheels);Cane - single point;Shower seat;Grab bars - tub/shower;Tub bench;BSC/3in1 Additional Comments: has had previous issues and was equipped from these past issues    Prior Function Prior Level of Function : Independent/Modified Independent             Mobility Comments: no AD used but also talks about falling       Hand Dominance   Dominant Hand: Right    Extremity/Trunk Assessment   Upper Extremity Assessment Upper Extremity Assessment: Defer to OT evaluation    Lower Extremity Assessment Lower Extremity Assessment: Overall WFL for tasks assessed    Cervical / Trunk Assessment Cervical / Trunk Assessment: Kyphotic (old compression fracture)  Communication   Communication: No difficulties  Cognition Arousal/Alertness: Awake/alert Behavior During Therapy: WFL for tasks assessed/performed Overall Cognitive Status: Within Functional Limits for tasks assessed                                 General Comments: has a good handle on the help at home        General Comments General comments (skin integrity, edema, etc.): Pt used HHA to steady for walk, and note her control of LOB is spotty.  Has tendency to list to L side and  back    Exercises     Assessment/Plan    PT Assessment Patient needs continued PT services  PT Problem List Decreased strength;Decreased balance;Decreased coordination       PT Treatment Interventions DME instruction;Gait training;Functional mobility training;Therapeutic activities;Therapeutic exercise;Balance training;Neuromuscular re-education;Patient/family education;Stair training    PT Goals (Current goals can be found in the Care Plan section)  Acute Rehab PT Goals Patient Stated Goal: to return home successfully and avoid rehab stay PT Goal Formulation: With patient Time For Goal Achievement: 02/15/23 Potential to Achieve Goals: Good    Frequency Min 3X/week     Co-evaluation               AM-PAC PT "6 Clicks" Mobility  Outcome Measure Help needed turning from your back to your side while in a flat bed without using bedrails?: A Little Help needed moving from lying on your back to sitting on the side of a flat bed without using bedrails?: A Little Help needed moving to and from a bed to a chair (including a wheelchair)?: A Little Help needed standing up from a chair using your arms (e.g., wheelchair or bedside chair)?: A Little Help needed to walk in hospital room?: A Little Help needed climbing 3-5 steps with  a railing? : A Little 6 Click Score: 18    End of Session Equipment Utilized During Treatment: Gait belt Activity Tolerance: Patient limited by fatigue;Treatment limited secondary to medical complications (Comment) Patient left: in bed;with call bell/phone within reach;with bed alarm set Nurse Communication: Mobility status PT Visit Diagnosis: Unsteadiness on feet (R26.81);Muscle weakness (generalized) (M62.81)    Time: 1111-1140 PT Time Calculation (min) (ACUTE ONLY): 29 min   Charges:   PT Evaluation $PT Eval Moderate Complexity: 1 Mod PT Treatments $Gait Training: 8-22 mins       Ivar Drape 02/08/2023, 2:16 PM  Samul Dada, PT  PhD Acute Rehab Dept. Number: [facility] [id number] and [facility] [phone]

## 2023-02-08 NOTE — Care Management Obs Status (Signed)
MEDICARE OBSERVATION STATUS NOTIFICATION   Patient Details  Name: ZAEDA DOPORTO MRN: [id number] Date of Birth: 03-12-1935   Medicare Observation Status Notification Given:  Yes    Kingsley Plan, RN 02/08/2023, 12:16 PM

## 2023-02-08 NOTE — Progress Notes (Signed)
Ortho tech notified of orders for sling and immobilizer.

## 2023-02-08 NOTE — Plan of Care (Signed)

## 2023-02-08 NOTE — Progress Notes (Signed)
PROGRESS NOTE   Hailey [name]  NWG:[id number]    DOB: 1935-02-13    DOA: 02/07/2023  PCP: Tresa Garter, MD   I have briefly reviewed patients previous medical records in [facility].  Chief Complaint  Patient presents with   Level 1 Trauma    Brief Narrative:  88 year old married female, independent, drives, PMH of HTN, permanent A-fib on Eliquis, CAD s/p DES to RCA in 2018, chronic diastolic CHF, type II DM, diabetic neuropathy, remote history of BPPV, anxiety/depression, presented to the ED on 02/07/2023 following a mechanical fall and left shoulder pain.  She stepped on a caregivers foot, lost her balance and fell on her left shoulder without presyncope or syncope.  Per EMS, hypotensive at 60/30, clammy and pale, given 300 mL IV fluid bolus followed by an additional 1 L bolus due to BP of 88/64.  In ED oxygen saturations dropped to 90% on room air and placed on 2 L/min  oxygen.  Presented as a level 1 trauma, seen by trauma team, noted to be bradycardic, recommended medical admission and signed off.  Orthopedics recommended nonoperative treatment of left humerus fracture and follow-up with orthopedics in 2 weeks.  Cardiology consulted for bradycardia, stopped low-dose Toprol-XL and will arrange outpatient 30-day heart monitor with cardiology and EP follow-up   Assessment & Plan:  Principal Problem:   Fall Active Problems:   Closed comminuted fracture of left humerus   Bradycardia   Hypotension   Chronic a-fib (HCC)   Mechanical fall at home and left humerus fracture: History as noted above. CT C/A/P 02/07/2023: Moderately displaced and comminuted proximal left humeral head and neck fracture.  No other definitive acute traumatic injury noted in the chest, abdomen or pelvis CT head and C-spine without acute abnormalities. Chest x-ray and pelvic x-rays negative. Trauma team seen and signed off. Orthopedics recommend nonoperative management of left shoulder fracture,  nonweightbearing, sling, outpatient follow-up with Dr. Carola Frost in 2 weeks. Seen this morning, sling had not yet been placed.  Pain not controlled and did not sleep much at all overnight due to same. Multimodality pain control: Scheduled Tylenol 1 g 3 times daily, Robaxin 500 Mg 3 times daily, as needed Oxy IR and Dilaudid for moderate and severe pain respectively. PT and OT evaluation pending. Hopeful DC in a.m. pending pain control and therapies recommendation.  Hypotension: Blood pressures on field as noted above.  Unclear etiology.  Was bolused with IV fluids.  No orthostatic vital sign changes.  Remains with soft blood pressures.  Hold antihypertensives and monitor closely.  Bradycardia: Per cardiology note, in the ED, heart rate dropped into the upper 30s for a few seconds but quickly recovered but otherwise had been in the 50s.  Was on Toprol-XL 12.5 Mg twice daily PTA.  Suspected due to low-dose metoprolol.  Cardiology have consulted and discontinued same.  They plan to arrange a 30-day event monitor with outpatient follow-up with cardiology/EP cardiology.  TSH normal.  Transient lactic acidosis: Suspected due to hypotension and dehydration, resolved.  Normocytic anemia: No prior labs to compare.  Stable.  Suspected stage IIIb CKD: No prior labs to compare.  Presented with creatinine of 1.2 and remains in the same range.  Follow BMP in AM.  Permanent A-fib: Cardiology input appreciated.  Did not see any long pauses.  Suspect that fentanyl given for pain may have exacerbated bradycardia and hypotension with some dehydration.  Discontinued Toprol, outpatient follow-up with Dr. Shari Prows and EP.  No indication for  PPM.  Bradycardia not related to mechanical fall.  Continue Eliquis 2.5 Mg twice daily.  History of frequent falls: Per cardiology, had been referred for Watchman device in the past but did not have appropriate anatomy.  Chronic HFpEF/moderate TR/mild MR Cardiology has seen.   Clinically euvolemic.  Continue Jardiance.  CAD s/p PCI No anginal symptoms.  Not on aspirin due to Eliquis use.  Although Lipitor listed on MAR, patient states that she does not take this.  Toprol-XL discontinued due to bradycardia.  ??  Type II DM Listed on H&P but not sure if she has this.  Jardiance likely for cardiac indication.  Will check A1c.  Incidental CT C/A/P findings, for outpatient follow-up with PCP. - 4.4 cm ascending thoracic aortic aneurysm.  Recommend annual imaging follow up by CTA or MRA.  - 4 mm right middle lobe nodule.  Outpatient follow-up as deemed necessary. - Diverticulosis of descending and sigmoid colon without inflammation. - Old T10 compression fracture is noted. - Coronary artery calcifications are noted. - Aortic Atherosclerosis (ICD10-I70.0).  Body mass index is 24.78 kg/m.   ACP Documents: None present DVT prophylaxis: apixaban (ELIQUIS) tablet 2.5 mg Start: 02/07/23 2200 SCDs Start: 02/07/23 1458     Code Status: Full Code:  Family Communication:  Disposition:  Status is: Observation The patient remains OBS appropriate and will d/c before 2 midnights.  However if she continues to require significant amounts of IV opioids for pain, may need to switch to inpatient.     Consultants:   Orthopedics Cardiology  Procedures:     Antimicrobials:      Subjective:  This morning along with patient's female RN in the room.  Reports that she only got IV Dilaudid last night, was in significant pain.  Did not get her home meds including bedtime Ativan and Lexapro.  Thereby did not sleep much at all last night.  Pain rated as 10/10 in severity.  States that she did not know that she had to ask for oral Oxy IR.  Objective:   Vitals:   02/08/23 0342 02/08/23 0346 02/08/23 0748 02/08/23 0848  BP: 106/73  (!) 85/59 94/70  Pulse: (!) 54  (!) 57 63  Resp: 18  16   Temp: 98.3 F (36.8 C)  (!) 97.5 F (36.4 C)   TempSrc: Oral  Oral   SpO2: 94%   (!) 89% 93%  Weight:  63.5 kg    Height:        General exam: Elderly female, moderately built and thinly nourished sitting up in bed without obvious distress. Respiratory system: Clear to auscultation. Respiratory effort normal. Cardiovascular system: S1 & S2 heard, RRR. No JVD, murmurs, rubs, gallops or clicks. No pedal edema.  Telemetry personally reviewed: SB in the 50s-60s mostly.  Occasional SP noted in the 34 and 42/min.  Pause of 2.19 seconds noted. Gastrointestinal system: Abdomen is nondistended, soft and nontender. No organomegaly or masses felt. Normal bowel sounds heard. Central nervous system: Alert and oriented. No focal neurological deficits. Extremities: Symmetric 5 x 5 power except left upper extremity with cannot be assessed due to pain from left shoulder fracture.  Left shoulder mildly swollen but not warm.  Tender to touch. Skin: No rashes, lesions or ulcers Psychiatry: Judgement and insight appear normal. Mood & affect appropriate.     Data Reviewed:   I have personally reviewed following labs and imaging studies   CBC: Recent Labs  Lab 02/07/23 1148 02/08/23 0701  WBC 4.9 8.2  HGB 11.2*  10.9* 11.7*  HCT 33.2*  32.0* 35.9*  MCV 98.5 98.4  PLT 179 230    Basic Metabolic Panel: Recent Labs  Lab 02/07/23 1148 02/08/23 0701  NA 135  136 134*  K 4.1  4.2 3.9  CL 108  105 102  CO2 20* 20*  GLUCOSE 144*  140* 100*  BUN 24*  25* 18  CREATININE 1.23*  1.20* 1.22*  CALCIUM 8.3* 8.7*    Liver Function Tests: Recent Labs  Lab 02/07/23 1148  AST 22  ALT 12  ALKPHOS 46  BILITOT 0.6  PROT 5.3*  ALBUMIN 3.0*    CBG: Recent Labs  Lab 02/08/23 0621  GLUCAP 89    Microbiology Studies:  No results found for this or any previous visit (from the past 240 hour(s)).  Radiology Studies:  CT HEAD WO CONTRAST ( )  Result Date: 02/07/2023 CLINICAL DATA:  Head trauma, minor (Age >= 65y); Neck trauma (Age >= 65y) EXAM: CT HEAD WITHOUT  CONTRAST CT CERVICAL SPINE WITHOUT CONTRAST TECHNIQUE: Multidetector CT imaging of the head and cervical spine was performed following the standard protocol without intravenous contrast. Multiplanar CT image reconstructions of the cervical spine were also generated. RADIATION DOSE REDUCTION: This exam was performed according to the departmental dose-optimization program which includes automated exposure control, adjustment of the mA and/or kV according to patient size and/or use of iterative reconstruction technique. COMPARISON:  MR Head 04/25/22, CT C Spine 04/24/22 FINDINGS: CT HEAD FINDINGS Brain: No evidence of acute infarction, hemorrhage, hydrocephalus, extra-axial collection or mass lesion/mass effect. There is sequela of moderate chronic microvascular ischemic change with a chronic infarct in the left parietal lobe in the left frontal lobe Vascular: No hyperdense vessel or unexpected calcification. Skull: Normal. Negative for fracture or focal lesion. Sinuses/Orbits: No middle ear or mastoid effusion. Paranasal sinuses are notable for mucosal thickening left maxillary sinus. Bilateral lens replacement. Orbits are otherwise unremarkable Other: None. CT CERVICAL SPINE FINDINGS Alignment: Normal. Skull base and vertebrae: No acute fracture. No primary bone lesion or focal pathologic process. Soft tissues and spinal canal: No prevertebral fluid or swelling. No visible canal hematoma. Hyperdense region in the left aspect of the spinal canal at the C3-C4 level (series 9, image 38) correlates with the portion of the left vertebral artery that courses through the spinal canal. Disc levels:  No evidence of high-grade spinal canal stenosis. Upper chest: Negative. Other: None IMPRESSION: 1. No acute intracranial abnormality. 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 2. No acute fracture or traumatic subluxation of the cervical spine. 3.  Redemonstrated tortuous left vertebral artery with the V2 segment coursing through the left aspect of the spinal canal. Findings were discussed with Dr. Laurell Josephs on 02/07/23 at 12:15 PM Electronically Signed   By: Lorenza Cambridge M.D.   On: 02/07/2023 12:29   CT CERVICAL SPINE WO CONTRAST  Result Date: 02/07/2023 CLINICAL DATA:  Head trauma, minor (Age >= 65y); Neck trauma (Age >= 65y) EXAM: CT HEAD WITHOUT CONTRAST CT CERVICAL SPINE WITHOUT CONTRAST TECHNIQUE: Multidetector CT imaging of the head and cervical spine was performed following the standard protocol without intravenous contrast. Multiplanar CT image reconstructions of the cervical spine were also generated. RADIATION DOSE REDUCTION: This exam was performed according to the departmental dose-optimization program which includes automated exposure control, adjustment of the mA and/or kV according to patient size and/or use of iterative reconstruction technique. COMPARISON:  MR Head 04/25/22, CT  C Spine 04/24/22 FINDINGS: CT HEAD FINDINGS Brain: No evidence of acute infarction, hemorrhage, hydrocephalus, extra-axial collection or mass lesion/mass effect. There is sequela of moderate chronic microvascular ischemic change with a chronic infarct in the left parietal lobe in the left frontal lobe Vascular: No hyperdense vessel or unexpected calcification. Skull: Normal. Negative for fracture or focal lesion. Sinuses/Orbits: No middle ear or mastoid effusion. Paranasal sinuses are notable for mucosal thickening left maxillary sinus. Bilateral lens replacement. Orbits are otherwise unremarkable Other: None. CT CERVICAL SPINE FINDINGS Alignment: Normal. Skull base and vertebrae: No acute fracture. No primary bone lesion or focal pathologic process. Soft tissues and spinal canal: No prevertebral fluid or swelling. No visible canal hematoma. Hyperdense region in the left aspect of the spinal canal at the C3-C4 level (series 9, image 38) correlates with the portion of the  left vertebral artery that courses through the spinal canal. Disc levels:  No evidence of high-grade spinal canal stenosis. Upper chest: Negative. Other: None IMPRESSION: 1. No acute intracranial abnormality. 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 2. No acute fracture or traumatic subluxation of the cervical spine. 3. Redemonstrated tortuous left vertebral artery with the V2 segment coursing through the left aspect of the spinal canal. Findings were discussed with Dr. Laurell Josephs on 02/07/23 at 12:15 PM Electronically Signed   By: Lorenza Cambridge M.D.   On: 02/07/2023 12:29   CT CHEST ABDOMEN PELVIS W CONTRAST  Result Date: 02/07/2023 CLINICAL DATA:  Fall. EXAM: CT CHEST, ABDOMEN, AND PELVIS WITH CONTRAST TECHNIQUE: Multidetector CT imaging of the chest, abdomen and pelvis was performed following the standard protocol during bolus administration of intravenous contrast. RADIATION DOSE REDUCTION: This exam was performed according to the departmental dose-optimization program which includes automated exposure control, adjustment of the mA and/or kV according to patient size and/or use of iterative reconstruction technique. CONTRAST:  Unspecified. COMPARISON:  November 19, 2021. FINDINGS: CT CHEST FINDINGS Cardiovascular: 4.4 cm ascending thoracic aortic aneurysm is noted. No dissection is noted. Normal cardiac size. No pericardial effusion. Coronary artery calcifications are noted. Mediastinum/Nodes: Thyroid gland and esophagus are unremarkable. Calcified right paratracheal adenopathy is noted consistent with prior granulomatous disease. Lungs/Pleura: No pneumothorax or pleural effusion is noted. Minimal bilateral posterior basilar subsegmental atelectasis. 4 mm nodule noted in right middle lobe best seen on image number 96 of series 4. Musculoskeletal: Moderately displaced and comminuted proximal left humeral head and neck fracture is noted. CT  ABDOMEN PELVIS FINDINGS Hepatobiliary: No focal liver abnormality is seen. No gallstones, gallbladder wall thickening, or biliary dilatation. Pancreas: Fatty replacement of pancreas is noted. Spleen: Normal in size without focal abnormality. Adrenals/Urinary Tract: Adrenal glands appear normal. No hydronephrosis or renal obstruction is noted. Urinary bladder is unremarkable. Stomach/Bowel: Stomach appears normal. Diverticulosis of descending and sigmoid colon is noted without inflammation. There is no evidence of bowel obstruction. Vascular/Lymphatic: Aortic atherosclerosis. No enlarged abdominal or pelvic lymph nodes. Reproductive: Status post hysterectomy. No adnexal masses. Other: No abdominal wall hernia or abnormality. No abdominopelvic ascites. Musculoskeletal: Old T10 compression fracture is noted. No acute osseous abnormality is noted. IMPRESSION: Moderately displaced and comminuted proximal left humeral head and neck fracture. No other definite acute traumatic injury is noted in the chest, abdomen or pelvis. 4.4 cm ascending thoracic aortic aneurysm. Recommend annual imaging followup by CTA or MRA. This recommendation follows 2010 ACCF/AHA/AATS/ACR/ASA/SCA/SCAI/SIR/STS/SVM Guidelines for the Diagnosis and Management of Patients with Thoracic Aortic Disease. Circulation. 2010; 121: A213-Y865. Aortic  aneurysm NOS (ICD10-I71.9). 4 mm right middle lobe nodule. No follow-up needed if patient is low-risk.This recommendation follows the consensus statement: Guidelines for Management of Incidental Pulmonary Nodules Detected on CT Images: From the Fleischner Society 2017; Radiology 2017; 284:228-243. Diverticulosis of descending and sigmoid colon without inflammation. Old T10 compression fracture is noted. Coronary artery calcifications are noted. Aortic Atherosclerosis (ICD10-I70.0). These results were discussed in person at the time of interpretation on 02/07/2023 at 12:10 pm to provider Violeta Gelinas. MD, who  verbally acknowledged these results. Electronically Signed   By: Lupita Raider M.D.   On: 02/07/2023 12:24   DG Pelvis Portable  Result Date: 02/07/2023 CLINICAL DATA:  Fall. EXAM: PORTABLE PELVIS 1-2 VIEWS COMPARISON:  None Available. FINDINGS: There is no evidence of pelvic fracture or diastasis. No pelvic bone lesions are seen. IMPRESSION: Negative. Electronically Signed   By: Lupita Raider M.D.   On: 02/07/2023 12:05   DG Chest Port 1 View  Result Date: 02/07/2023 CLINICAL DATA:  Fall. EXAM: PORTABLE CHEST 1 VIEW COMPARISON:  November 19, 2021. FINDINGS: Mild cardiomegaly the heart size and mediastinal contours are within normal limits. Both lungs are clear. The visualized skeletal structures are unremarkable. IMPRESSION: No active disease. Electronically Signed   By: Lupita Raider M.D.   On: 02/07/2023 12:04    Scheduled Meds:    acetaminophen  1,000 mg Oral TID   apixaban  2.5 mg Oral BID   empagliflozin  10 mg Oral Daily   [START ON 02/09/2023] escitalopram  5 mg Oral QPM   LORazepam  2 mg Oral QHS   methocarbamol  500 mg Oral TID   pantoprazole  40 mg Oral Daily   sodium chloride flush  3 mL Intravenous Q12H    Continuous Infusions:     LOS: 0 days     Marcellus Scott, MD,  FACP, FHM, SFHM, [facility], [facility]   [facility] [place]     To contact the attending provider between 7A-7P or the covering provider during after hours 7P-7A, please log into the web site [email or web address] and access using universal [place] password for that web site. If you do not have the password, please call the hospital operator.  02/08/2023, 11:38 AM

## 2023-02-08 NOTE — Evaluation (Signed)
Occupational Therapy Evaluation Patient Details Name: Hailey [name] MRN: [id number] DOB: [birth year] Today's Date: 02/08/2023   History of Present Illness 88 yo female with hx of falls was admitted 5/30 for trip injury and landed on L shoulder.  Has left humeral head and neck fracture, non surgical management. Was found to be hypotensive and bradycardic initially.  PMHx:  stroke, anemia, permanent a-fib, CHF, DM, ascending aortic aneurysm, atherosclerosis, T10 compression fracture, diverticulosis,   Clinical Impression   Pt currently at a min guard assist for functional transfers without an assistive device to the toilet.  Min assist for LB selfcare sit to stand with min to mod for UB selfcare secondary to left humeral fracture.  Pt will benefit from acute care OT at this time for ongoing education and treatment to help increase ADL independence for return home with her spouse and PRN supervision.  No post acute OT recommended initially but once pt is cleared for shoulder AROM, would recommend outpatient eval and treat.        Recommendations for follow up therapy are one component of a multi-disciplinary discharge planning process, led by the attending physician.  Recommendations may be updated based on patient status, additional functional criteria and insurance authorization.   Assistance Recommended at Discharge PRN  Patient can return home with the following A little help with walking and/or transfers;A little help with bathing/dressing/bathroom;Assistance with cooking/housework;Assist for transportation    Functional Status Assessment  Patient has had a recent decline in their functional status and demonstrates the ability to make significant improvements in function in a reasonable and predictable amount of time.  Equipment Recommendations  None recommended by OT       Precautions / Restrictions Precautions Precautions: Fall Precaution Comments: LUE humeral fracture Required Braces  or Orthoses: Sling Restrictions Weight Bearing Restrictions: Yes LUE Weight Bearing: Non weight bearing Other Position/Activity Restrictions: wear LUE sling at all times except B/D tasks      Mobility Bed Mobility Overal bed mobility: Needs Assistance Bed Mobility: Supine to Sit, Sit to Supine     Supine to sit: Min guard Sit to supine: Min guard        Transfers Overall transfer level: Needs assistance Equipment used: None Transfers: Sit to/from Stand Sit to Stand: Min guard                  Balance Overall balance assessment: Needs assistance, History of Falls Sitting-balance support: Feet supported Sitting balance-Leahy Scale: Fair     Standing balance support: Single extremity supported Standing balance-Leahy Scale: Fair                             ADL either performed or assessed with clinical judgement   ADL Overall ADL's : Needs assistance/impaired Eating/Feeding: Set up;Sitting Eating/Feeding Details (indicate cue type and reason): simulated Grooming: Supervision/safety;Standing;Wash/dry hands   Upper Body Bathing: Minimal assistance;Sitting Upper Body Bathing Details (indicate cue type and reason): simulated Lower Body Bathing: Minimal assistance;Sit to/from stand Lower Body Bathing Details (indicate cue type and reason): simulated Upper Body Dressing : Sitting;Moderate assistance   Lower Body Dressing: Minimal assistance;Sit to/from stand   Toilet Transfer: Ambulation;Min guard   Toileting- Clothing Manipulation and Hygiene: Minimal assistance;Sit to/from stand       Functional mobility during ADLs: Min guard (ambulation no assistive device) General ADL Comments: Pt and spouse educated on hand and digit AROM exercises only at this time.  Shoulder handout and  practice provided for donning and doffing sling for ADLs, with spouse completing return demonstration with donning it.  Educated on bathing and dressing techniques as well as  positioning for sleeping.     Vision Baseline Vision/History: 1 Wears glasses Ability to See in Adequate Light: 0 Adequate Patient Visual Report: No change from baseline Vision Assessment?: No apparent visual deficits     Perception  WFL   Praxis  [facility]    Pertinent Vitals/Pain Pain Assessment Pain Assessment: 0-10 Faces Pain Scale: Hurts little more Pain Location: L arm with sling Pain Descriptors / Indicators: Discomfort Pain Intervention(s): Limited activity within patient's tolerance, Monitored during session, Repositioned     Hand Dominance Right   Extremity/Trunk Assessment Upper Extremity Assessment Upper Extremity Assessment: LUE deficits/detail LUE Deficits / Details: Pt with LUE in sling secondary to humeral fx, able to move digits and wrist WFLs. LUE: Unable to fully assess due to immobilization LUE Sensation: WNL LUE Coordination: decreased gross motor   Lower Extremity Assessment Lower Extremity Assessment: Defer to PT evaluation   Cervical / Trunk Assessment Cervical / Trunk Assessment: Kyphotic   Communication Communication Communication: No difficulties   Cognition Arousal/Alertness: Awake/alert Behavior During Therapy: WFL for tasks assessed/performed Overall Cognitive Status: Within Functional Limits for tasks assessed                                       General Comments  Pt used HHA to steady for walk, and note her control of LOB is spotty.  Has tendency to list to L side and back            Home Living Family/patient expects to be discharged to:: Private residence Living Arrangements: Spouse/significant other Available Help at Discharge: Family;Available 24 hours/day Type of Home: House Home Access: Stairs to enter [employer] of Steps: 1 threshhold Entrance Stairs-Rails: None Home Layout: Two level;Able to live on main level with bedroom/bathroom Alternate Level Stairs-Number of Steps: flight Alternate  Level Stairs-Rails: Can reach both Bathroom Shower/Tub: Producer, television/film/video: Standard     Home Equipment: Agricultural consultant (2 wheels);Cane - single point;Shower seat;Grab bars - tub/shower;Tub bench;BSC/3in1   Additional Comments: has had previous issues and was equipped from these past issues      Prior Functioning/Environment Prior Level of Function : Independent/Modified Independent             Mobility Comments: no AD used but also talks about falling          OT Problem List: Decreased strength;Decreased range of motion;Impaired balance (sitting and/or standing);Pain;Impaired UE functional use      OT Treatment/Interventions: Self-care/ADL training;Patient/family education;Balance training;Therapeutic activities;DME and/or AE instruction    OT Goals(Current goals can be found in the care plan section) Acute Rehab OT Goals Patient Stated Goal: Pt did not state but agreeable to working with therapy. OT Goal Formulation: With patient Time For Goal Achievement: 02/15/23 Potential to Achieve Goals: Good  OT Frequency: Min 2X/week    Co-evaluation              AM-PAC OT "6 Clicks" Daily Activity     Outcome Measure Help from another person eating meals?: A Little Help from another person taking care of personal grooming?: A Little Help from another person toileting, which includes using toliet, bedpan, or urinal?: A Little Help from another person bathing (including washing, rinsing, drying)?: A Little Help  from another person to put on and taking off regular upper body clothing?: A Lot Help from another person to put on and taking off regular lower body clothing?: A Little 6 Click Score: 17   End of Session Equipment Utilized During Treatment: Other (comment) (sling) Nurse Communication: Mobility status  Activity Tolerance: Patient tolerated treatment well Patient left: in chair;with call bell/phone within reach  OT Visit Diagnosis: Unsteadiness  on feet (R26.81);Pain;Muscle weakness (generalized) (M62.81) Pain - Right/Left: Left Pain - part of body: Shoulder                Time: 4098-1191 OT Time Calculation (min): 51 min Charges:  OT General Charges $OT Visit: 1 Visit OT Evaluation $OT Eval Moderate Complexity: 1 Mod OT Treatments $Self Care/Home Management : 23-37 mins Perrin Maltese, OTR/L Acute Rehabilitation Services  Office [phone] 02/08/2023

## 2023-02-08 NOTE — Plan of Care (Signed)
  Problem: Education: Goal: Knowledge of condition and prescribed therapy will improve Outcome: Progressing   Problem: Cardiac: Goal: Will achieve and/or maintain adequate cardiac output Outcome: Progressing   Problem: Physical Regulation: Goal: Complications related to the disease process, condition or treatment will be avoided or minimized Outcome: Progressing   Problem: Education: Goal: Knowledge of General Education information will improve Description: Including pain rating scale, medication(s)/side effects and non-pharmacologic comfort measures Outcome: Progressing   Problem: Nutrition: Goal: Adequate nutrition will be maintained Outcome: Progressing   Problem: Coping: Goal: Level of anxiety will decrease Outcome: Progressing   Problem: Elimination: Goal: Will not experience complications related to bowel motility Outcome: Progressing   Problem: Safety: Goal: Ability to remain free from injury will improve Outcome: Progressing

## 2023-02-08 NOTE — TOC Initial Note (Addendum)
Transition of Care (TOC) - Initial/Assessment Note   Spoke to patient at bedside.   Patient from home with husband   Patient has walker, cane, wheel chair , 3 in 1 and shower chair at home   Discussed home health PT/OT. Patient aware home health PT/OT only comes about twice a week for a hour at a time. Patient working on getting someone to stay longer peroids of time.   Husband can cook and clean for her   Kandee Keen with [facility] accepted referral for HHPT/OT  Patient Details  Name: Hailey [name] MRN: [id number] Date of Birth: 1935-01-03  Transition of Care [facility]) CM/SW Contact:    Kingsley Plan, RN Phone Number: 02/08/2023, 12:20 PM  Clinical Narrative:                   Expected Discharge Plan: Home w Home Health Services Barriers to Discharge: Continued Medical Work up   Patient Goals and CMS Choice Patient states their goals for this hospitalization and ongoing recovery are:: to return to home CMS Medicare.gov Compare Post Acute Care list provided to:: Patient Choice offered to / list presented to : Patient [place] ownership interest in [facility].provided to:: Patient    Expected Discharge Plan and Services   Discharge Planning Services: CM Consult Post Acute Care Choice: Home Health                   DME Arranged: N/A         HH Arranged: PT, OT          Prior Living Arrangements/Services   Lives with:: Spouse Patient language and need for interpreter reviewed:: Yes Do you feel safe going back to the place where you live?: Yes      Need for Family Participation in Patient Care: Yes (Comment) Care giver support system in place?: Yes (comment) Current home services: DME Criminal Activity/Legal Involvement Pertinent to Current Situation/Hospitalization: No - Comment as needed  Activities of Daily Living      Permission Sought/Granted   Permission granted to share information with : No              Emotional  Assessment Appearance:: Appears stated age Attitude/Demeanor/Rapport: Engaged Affect (typically observed): Accepting Orientation: : Oriented to Self, Oriented to Place, Oriented to  Time, Oriented to Situation Alcohol / Substance Use: Not Applicable Psych Involvement: No (comment)  Admission diagnosis:  Fall [W19.XXXA] Fall, initial encounter [id number]] Hypotension, unspecified hypotension type [I95.9] Closed nondisplaced fracture of surgical neck of left humerus, unspecified fracture morphology, initial encounter [S42.215A] Aneurysm of ascending aorta without rupture [facility]) [I71.21] Patient Active Problem List   Diagnosis Date Noted   Closed comminuted fracture of left humerus 02/07/2023   Bradycardia 02/07/2023   Hypotension 02/07/2023   Fall 02/07/2023   Chronic a-fib (HCC) 02/07/2023   PCP:  Tresa Garter, MD Pharmacy:   [facility] [place], [place] - [address] [facility] [address] [place] [address] Phone: [phone] Fax: [phone]     Social Determinants of Health (SDOH) Social History: SDOH Screenings   Tobacco Use: Low Risk  (02/07/2023)   SDOH Interventions:     Readmission Risk Interventions     No data to display

## 2023-02-09 DIAGNOSIS — S42215D Unspecified nondisplaced fracture of surgical neck of left humerus, subsequent encounter for fracture with routine healing: Secondary | ICD-10-CM

## 2023-02-09 DIAGNOSIS — W19XXXD Unspecified fall, subsequent encounter: Secondary | ICD-10-CM | POA: Diagnosis not present

## 2023-02-09 DIAGNOSIS — I959 Hypotension, unspecified: Secondary | ICD-10-CM | POA: Diagnosis not present

## 2023-02-09 LAB — CBC
HCT: 30.7 % — ABNORMAL LOW (ref 36.0–46.0)
Hemoglobin: 10 g/dL — ABNORMAL LOW (ref 12.0–15.0)
MCH: 32.1 pg (ref 26.0–34.0)
MCHC: 32.6 g/dL (ref 30.0–36.0)
MCV: 98.4 fL (ref 80.0–100.0)
Platelets: 179 10*3/uL (ref 150–400)
RBC: 3.12 MIL/uL — ABNORMAL LOW (ref 3.87–5.11)
RDW: 14.4 % (ref 11.5–15.5)
WBC: 6.2 10*3/uL (ref 4.0–10.5)
nRBC: 0 % (ref 0.0–0.2)

## 2023-02-09 LAB — BASIC METABOLIC PANEL
Anion gap: 8 (ref 5–15)
BUN: 19 mg/dL (ref 8–23)
CO2: 20 mmol/L — ABNORMAL LOW (ref 22–32)
Calcium: 8.6 mg/dL — ABNORMAL LOW (ref 8.9–10.3)
Chloride: 107 mmol/L (ref 98–111)
Creatinine, Ser: 1.05 mg/dL — ABNORMAL HIGH (ref 0.44–1.00)
GFR, Estimated: 52 mL/min — ABNORMAL LOW (ref >60)
Glucose, Bld: 109 mg/dL — ABNORMAL HIGH (ref 70–99)
Potassium: 3.9 mmol/L (ref 3.5–5.1)
Sodium: 135 mmol/L (ref 135–145)

## 2023-02-09 LAB — HEMOGLOBIN A1C
Hgb A1c MFr Bld: 5.5 % (ref 4.8–5.6)
Mean Plasma Glucose: 111 mg/dL

## 2023-02-09 MED ORDER — ACETAMINOPHEN 500 MG PO TABS: 1000.0000 mg | ORAL_TABLET | Freq: Three times a day (TID) | ORAL | Status: DC | PRN

## 2023-02-09 MED ORDER — OXYCODONE HCL 5 MG PO TABS: 5.0000 mg | ORAL_TABLET | Freq: Four times a day (QID) | ORAL | 0 refills | Status: DC | PRN

## 2023-02-09 MED ORDER — METHOCARBAMOL 500 MG PO TABS: 500.0000 mg | ORAL_TABLET | Freq: Three times a day (TID) | ORAL | 0 refills | Status: DC | PRN

## 2023-02-09 MED ORDER — SENNOSIDES-DOCUSATE SODIUM 8.6-50 MG PO TABS: 1.0000 | ORAL_TABLET | Freq: Every evening | ORAL | 0 refills | Status: DC | PRN

## 2023-02-09 NOTE — Discharge Instructions (Addendum)

## 2023-02-09 NOTE — Progress Notes (Signed)
Orthostatics    02/08/23 2122 02/08/23 2124 02/08/23 2127  Vitals  BP 120/69 125/82 (!) 125/92  MAP (mmHg) 85 96 103  BP Location Right Arm Right Arm Right Arm  BP Method Automatic Automatic Automatic  Patient Position (if appropriate) Lying Sitting Standing  Pulse Rate (!) 59 70 71  Pulse Rate Source Monitor Monitor Monitor  Resp 16 16 18     02/08/23 2128 02/08/23 2129 02/08/23 2132  Vitals  BP (!) 125/92 (!) 121/91 111/81  MAP (mmHg) 103 102 92  BP Location Right Arm  --  Right Arm  BP Method Automatic Automatic Automatic  Patient Position (if appropriate) Standing Sitting Lying  Pulse Rate 66 70 70  Pulse Rate Source Monitor Monitor Monitor  Resp 18  --  18

## 2023-02-09 NOTE — Discharge Summary (Signed)
Physician Discharge Summary  Hailey [name] WUJ:[id number] DOB: 02/21/1935  PCP: Tresa Garter, MD  Admitted from: Home Discharged to: Home  Admit date: 02/07/2023 Discharge date: 02/09/2023  Recommendations for Outpatient Follow-up:    Follow-up Information     Care, [facility] Follow up.   Specialty: Home Health Services Contact information: [address] [place] [address] [phone]         Plotnikov, Georgina Quint, MD. Schedule an appointment as soon as possible for a visit in 1 week(s).   Specialty: Internal Medicine Why: To be seen with repeat labs (CBC & BMP). Contact information: [address] [place] [address] [phone]         Myrene Galas, MD. Schedule an appointment as soon as possible for a visit in 2 week(s).   Specialty: Orthopedic Surgery Contact information: [address] [place] [address] [phone]         Meriam Sprague, MD. Schedule an appointment as soon as possible for a visit.   Specialties: Cardiology, Radiology Contact information: [address] [place] [address] [phone]                  Home Health: Home Health Orders (From admission, onward)     Start     Ordered   02/08/23 1224  Home Health  At discharge       Question Answer Comment  To provide the following care/treatments PT   To provide the following care/treatments OT      02/08/23 1225             Equipment/Devices: None recommended by therapies.    Discharge Condition: Improved and stable.   Code Status: Full Code Diet recommendation:  Discharge Diet Orders (From admission, onward)     Start     Ordered   02/09/23 0000  Diet - low sodium heart healthy        02/09/23 1129             Discharge Diagnoses:  Principal Problem:   Fall Active Problems:   Closed comminuted fracture of left humerus   Bradycardia   Hypotension   Chronic a-fib  [facility])   Brief Summary: 88 year old married female, independent, drives, PMH of HTN, permanent A-fib on Eliquis, CAD s/p DES to RCA in 2018, chronic diastolic CHF, neuropathy, remote history of BPPV, anxiety/depression, presented to the ED on 02/07/2023 following a mechanical fall and left shoulder pain.  She stepped on a caregivers foot, lost her balance and fell on her left shoulder without presyncope or syncope.  Per EMS, hypotensive at 60/30, clammy and pale, given 300 mL IV fluid bolus followed by an additional 1 L bolus due to BP of 88/64.  In ED oxygen saturations dropped to 90% on room air and placed on 2 L/min [place] oxygen.  Presented as a level 1 trauma, seen by trauma team, noted to be bradycardic, recommended medical admission and signed off.  Orthopedics recommended nonoperative treatment of left humerus fracture and follow-up with orthopedics in 2 weeks.  Cardiology consulted for bradycardia, stopped low-dose Toprol-XL (although did not see this medication on her home med list on MAR at time of discharge) and will arrange outpatient 30-day heart monitor with cardiology and EP follow-up     Assessment & Plan:   Mechanical fall at home and left humerus fracture: History as noted above. CT C/A/P 02/07/2023: Moderately displaced and comminuted proximal left humeral head  and neck fracture.  No other definitive acute traumatic injury noted in the chest, abdomen or pelvis CT head and C-spine without acute abnormalities. Chest x-ray and pelvic x-rays negative. Trauma team seen and signed off. Orthopedics recommend nonoperative management of left shoulder fracture, nonweightbearing, sling, outpatient follow-up with Dr. Carola Frost in 2 weeks. Seen yesterday.  Patient was in significant pain.  Made multimodality pain medication adjustments.  Seen again this morning, much improved pain control.  Was able to sleep last night.  PT recommends home health PT.  OT recommends outpatient OT when patient is allowed  to weight-bear on left upper extremity.   Hypotension: Blood pressures on field as noted above.  Unclear etiology.  Was bolused with IV fluids.  No orthostatic vital sign changes.  Ongoing soft blood pressures with SBP's in the 100s.  Discontinued low-dose ARB at discharge until close outpatient follow-up with PCP to determine further management.  Of note, I did not see a beta-blocker on patient's home med list at time of discharge.   Bradycardia: Per cardiology note, in the ED, heart rate dropped into the upper 30s for a few seconds but quickly recovered but otherwise had been in the 50s.  Reportedly was on Toprol-XL 12.5 Mg twice daily PTA (please see note above).  Suspected due to low-dose metoprolol.  Cardiology have consulted and discontinued same.  They plan to arrange a 30-day event monitor with outpatient follow-up with cardiology/EP cardiology.  TSH normal.  Reviewed telemetry, now has A-fib with controlled ventricular rate mostly in the 50s and at times in the 60s.   Transient lactic acidosis: Suspected due to hypotension and dehydration, resolved.   Normocytic anemia: Hemoglobin has dropped from 11.7-10 in the absence of overt bleeding.  May have had some fracture related bleeding.  Follow CBCs closely as outpatient.   Suspected stage IIIa CKD: No prior labs to compare.  Presented with creatinine of 1.2.  Creatinine on day of discharge is improved to 1.05 with GFR of 52 making it a stage IIIa CKD.  Follow BMP in the office.   Permanent A-fib: Cardiology input appreciated.  Did not see any long pauses.  Suspect that fentanyl given for pain may have exacerbated bradycardia and hypotension with some dehydration.  Discontinued Toprol, outpatient follow-up with Dr. Shari Prows and EP.  No indication for PPM.  Bradycardia not related to mechanical fall.  Continue Eliquis 2.5 Mg twice daily.  However if patient is at risk for recurrent falls, this may have to be reconsidered and will defer to  outpatient PCP and cardiology and EP cardiology follow-up.   History of frequent falls: Per cardiology, had been referred for Watchman device in the past but did not have appropriate anatomy.  Again defer decision regarding continuation versus stopping anticoagulation to outpatient providers.   Chronic HFpEF/moderate TR/mild MR Cardiology has seen.  Clinically euvolemic.  Continue Jardiance.   CAD s/p PCI No anginal symptoms.  Not on aspirin due to Eliquis use.  Although Lipitor listed on MAR, patient states that she does not take this.  Toprol-XL discontinued due to bradycardia.   No DM diagnosis Listed on H&P but not sure if she has this.  Jardiance likely for cardiac indication.  A1c 5.5   Incidental CT C/A/P findings, for outpatient follow-up with PCP. - 4.4 cm ascending thoracic aortic aneurysm.  Recommend annual imaging follow up by CTA or MRA.  - 4 mm right middle lobe nodule.  Outpatient follow-up as deemed necessary. - Diverticulosis of descending and sigmoid  colon without inflammation. - Old T10 compression fracture is noted. - Coronary artery calcifications are noted. - Aortic Atherosclerosis (ICD10-I70.0).   Body mass index is 24.78 kg/m.     Consultants:   Orthopedics Cardiology   Procedures:      Discharge Instructions  Discharge Instructions     Call MD for:  difficulty breathing, headache or visual disturbances   Complete by: As directed    Call MD for:  extreme fatigue   Complete by: As directed    Call MD for:  persistant dizziness or light-headedness   Complete by: As directed    Call MD for:  persistant nausea and vomiting   Complete by: As directed    Call MD for:  severe uncontrolled pain   Complete by: As directed    Call MD for:  temperature >100.4   Complete by: As directed    Diet - low sodium heart healthy   Complete by: As directed    Increase activity slowly   Complete by: As directed    Nonweightbearing on the left upper extremity  and wear sling at all times.        Medication List     STOP taking these medications    losartan 25 MG tablet Commonly known as: COZAAR       TAKE these medications    acetaminophen 500 MG tablet Commonly known as: TYLENOL Take 2 tablets (1,000 mg total) by mouth every 8 (eight) hours as needed for mild pain (May take 2 tabs (1000 mg total) 3 times daily scheduled for 3-5 days, following which can take it 3 times daily as needed).   Eliquis 2.5 MG Tabs tablet Generic drug: apixaban Take 2.5 mg by mouth 2 (two) times daily.   escitalopram 5 MG tablet Commonly known as: LEXAPRO Take 5 mg by mouth every evening.   esomeprazole 40 MG capsule Commonly known as: NEXIUM Take 40 mg by mouth daily at 12 noon.   GABAPENTIN EX Apply 1 Application topically 3 (three) times daily as needed (To vulva). Medication: Gabapentin 6% cream   Jardiance 10 MG Tabs tablet Generic drug: empagliflozin Take 10 mg by mouth daily.   LORazepam 1 MG tablet Commonly known as: ATIVAN Take 2 mg by mouth at bedtime.   meclizine 25 MG tablet Commonly known as: ANTIVERT Take 25 mg by mouth 3 (three) times daily as needed for dizziness.   methocarbamol 500 MG tablet Commonly known as: ROBAXIN Take 1 tablet (500 mg total) by mouth every 8 (eight) hours as needed for muscle spasms.   nitroGLYCERIN 0.4 MG SL tablet Commonly known as: NITROSTAT Place 0.4 mg under the tongue every 5 (five) minutes as needed for chest pain.   oxyCODONE 5 MG immediate release tablet Commonly known as: Oxy IR/ROXICODONE Take 1 tablet (5 mg total) by mouth every 6 (six) hours as needed for moderate pain or severe pain.   senna-docusate 8.6-50 MG tablet Commonly known as: Senokot-S Take 1 tablet by mouth at bedtime as needed for mild constipation or moderate constipation.       No Known Allergies    Procedures/Studies: CT HEAD WO CONTRAST ( )  Result Date: 02/07/2023 CLINICAL DATA:  Head trauma, minor  (Age >= 65y); Neck trauma (Age >= 65y) EXAM: CT HEAD WITHOUT CONTRAST CT CERVICAL SPINE WITHOUT CONTRAST TECHNIQUE: Multidetector CT imaging of the head and cervical spine was performed following the standard protocol without intravenous contrast. Multiplanar CT image reconstructions of the cervical spine were also  generated. RADIATION DOSE REDUCTION: This exam was performed according to the departmental dose-optimization program which includes automated exposure control, adjustment of the mA and/or kV according to patient size and/or use of iterative reconstruction technique. COMPARISON:  MR Head 04/25/22, CT C Spine 04/24/22 FINDINGS: CT HEAD FINDINGS Brain: No evidence of acute infarction, hemorrhage, hydrocephalus, extra-axial collection or mass lesion/mass effect. There is sequela of moderate chronic microvascular ischemic change with a chronic infarct in the left parietal lobe in the left frontal lobe Vascular: No hyperdense vessel or unexpected calcification. Skull: Normal. Negative for fracture or focal lesion. Sinuses/Orbits: No middle ear or mastoid effusion. Paranasal sinuses are notable for mucosal thickening left maxillary sinus. Bilateral lens replacement. Orbits are otherwise unremarkable Other: None. CT CERVICAL SPINE FINDINGS Alignment: Normal. Skull base and vertebrae: No acute fracture. No primary bone lesion or focal pathologic process. Soft tissues and spinal canal: No prevertebral fluid or swelling. No visible canal hematoma. Hyperdense region in the left aspect of the spinal canal at the C3-C4 level (series 9, image 38) correlates with the portion of the left vertebral artery that courses through the spinal canal. Disc levels:  No evidence of high-grade spinal canal stenosis. Upper chest: Negative. Other: None IMPRESSION: 1. No acute intracranial abnormality. 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 2. No acute  fracture or traumatic subluxation of the cervical spine. 3. Redemonstrated tortuous left vertebral artery with the V2 segment coursing through the left aspect of the spinal canal. Findings were discussed with Dr. Laurell Josephs on 02/07/23 at 12:15 PM Electronically Signed   By: Lorenza Cambridge M.D.   On: 02/07/2023 12:29   CT CERVICAL SPINE WO CONTRAST  Result Date: 02/07/2023 CLINICAL DATA:  Head trauma, minor (Age >= 65y); Neck trauma (Age >= 65y) EXAM: CT HEAD WITHOUT CONTRAST CT CERVICAL SPINE WITHOUT CONTRAST TECHNIQUE: Multidetector CT imaging of the head and cervical spine was performed following the standard protocol without intravenous contrast. Multiplanar CT image reconstructions of the cervical spine were also generated. RADIATION DOSE REDUCTION: This exam was performed according to the departmental dose-optimization program which includes automated exposure control, adjustment of the mA and/or kV according to patient size and/or use of iterative reconstruction technique. COMPARISON:  MR Head 04/25/22, CT C Spine 04/24/22 FINDINGS: CT HEAD FINDINGS Brain: No evidence of acute infarction, hemorrhage, hydrocephalus, extra-axial collection or mass lesion/mass effect. There is sequela of moderate chronic microvascular ischemic change with a chronic infarct in the left parietal lobe in the left frontal lobe Vascular: No hyperdense vessel or unexpected calcification. Skull: Normal. Negative for fracture or focal lesion. Sinuses/Orbits: No middle ear or mastoid effusion. Paranasal sinuses are notable for mucosal thickening left maxillary sinus. Bilateral lens replacement. Orbits are otherwise unremarkable Other: None. CT CERVICAL SPINE FINDINGS Alignment: Normal. Skull base and vertebrae: No acute fracture. No primary bone lesion or focal pathologic process. Soft tissues and spinal canal: No prevertebral fluid or swelling. No visible canal hematoma. Hyperdense region in the left aspect of the spinal canal at the C3-C4  level (series 9, image 38) correlates with the portion of the left vertebral artery that courses through the spinal canal. Disc levels:  No evidence of high-grade spinal canal stenosis. Upper chest: Negative. Other: None IMPRESSION: 1. No acute intracranial abnormality. 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 2. No acute fracture or traumatic subluxation of the cervical spine. 3. Redemonstrated tortuous left  vertebral artery with the V2 segment coursing through the left aspect of the spinal canal. Findings were discussed with Dr. Laurell Josephs on 02/07/23 at 12:15 PM Electronically Signed   By: Lorenza Cambridge M.D.   On: 02/07/2023 12:29   CT CHEST ABDOMEN PELVIS W CONTRAST  Result Date: 02/07/2023 CLINICAL DATA:  Fall. EXAM: CT CHEST, ABDOMEN, AND PELVIS WITH CONTRAST TECHNIQUE: Multidetector CT imaging of the chest, abdomen and pelvis was performed following the standard protocol during bolus administration of intravenous contrast. RADIATION DOSE REDUCTION: This exam was performed according to the departmental dose-optimization program which includes automated exposure control, adjustment of the mA and/or kV according to patient size and/or use of iterative reconstruction technique. CONTRAST:  Unspecified. COMPARISON:  November 19, 2021. FINDINGS: CT CHEST FINDINGS Cardiovascular: 4.4 cm ascending thoracic aortic aneurysm is noted. No dissection is noted. Normal cardiac size. No pericardial effusion. Coronary artery calcifications are noted. Mediastinum/Nodes: Thyroid gland and esophagus are unremarkable. Calcified right paratracheal adenopathy is noted consistent with prior granulomatous disease. Lungs/Pleura: No pneumothorax or pleural effusion is noted. Minimal bilateral posterior basilar subsegmental atelectasis. 4 mm nodule noted in right middle lobe best seen on image number 96 of series 4. Musculoskeletal: Moderately displaced and  comminuted proximal left humeral head and neck fracture is noted. CT ABDOMEN PELVIS FINDINGS Hepatobiliary: No focal liver abnormality is seen. No gallstones, gallbladder wall thickening, or biliary dilatation. Pancreas: Fatty replacement of pancreas is noted. Spleen: Normal in size without focal abnormality. Adrenals/Urinary Tract: Adrenal glands appear normal. No hydronephrosis or renal obstruction is noted. Urinary bladder is unremarkable. Stomach/Bowel: Stomach appears normal. Diverticulosis of descending and sigmoid colon is noted without inflammation. There is no evidence of bowel obstruction. Vascular/Lymphatic: Aortic atherosclerosis. No enlarged abdominal or pelvic lymph nodes. Reproductive: Status post hysterectomy. No adnexal masses. Other: No abdominal wall hernia or abnormality. No abdominopelvic ascites. Musculoskeletal: Old T10 compression fracture is noted. No acute osseous abnormality is noted. IMPRESSION: Moderately displaced and comminuted proximal left humeral head and neck fracture. No other definite acute traumatic injury is noted in the chest, abdomen or pelvis. 4.4 cm ascending thoracic aortic aneurysm. Recommend annual imaging followup by CTA or MRA. This recommendation follows 2010 ACCF/AHA/AATS/ACR/ASA/SCA/SCAI/SIR/STS/SVM Guidelines for the Diagnosis and Management of Patients with Thoracic Aortic Disease. Circulation. 2010; 121: A416-S063. Aortic aneurysm NOS (ICD10-I71.9). 4 mm right middle lobe nodule. No follow-up needed if patient is low-risk.This recommendation follows the consensus statement: Guidelines for Management of Incidental Pulmonary Nodules Detected on CT Images: From the Fleischner Society 2017; Radiology 2017; 284:228-243. Diverticulosis of descending and sigmoid colon without inflammation. Old T10 compression fracture is noted. Coronary artery calcifications are noted. Aortic Atherosclerosis (ICD10-I70.0). These results were discussed in person at the time of  interpretation on 02/07/2023 at 12:10 pm to provider Violeta Gelinas. MD, who verbally acknowledged these results. Electronically Signed   By: Lupita Raider M.D.   On: 02/07/2023 12:24   DG Pelvis Portable  Result Date: 02/07/2023 CLINICAL DATA:  Fall. EXAM: PORTABLE PELVIS 1-2 VIEWS COMPARISON:  None Available. FINDINGS: There is no evidence of pelvic fracture or diastasis. No pelvic bone lesions are seen. IMPRESSION: Negative. Electronically Signed   By: Lupita Raider M.D.   On: 02/07/2023 12:05   DG Chest Port 1 View  Result Date: 02/07/2023 CLINICAL DATA:  Fall. EXAM: PORTABLE CHEST 1 VIEW COMPARISON:  November 19, 2021. FINDINGS: Mild cardiomegaly the heart size and mediastinal contours are within normal limits. Both lungs are clear. The visualized skeletal structures are unremarkable.  IMPRESSION: No active disease. Electronically Signed   By: Lupita Raider M.D.   On: 02/07/2023 12:04      Subjective: History as noted above.  Pain much better controlled.  Able to sleep much better last night.  Complains that the bed she is currently sitting and is uncomfortable and unable to position herself well to be able to use her only functioning right upper extremity.  Bed sinks in the middle.  Discharge Exam:  Vitals:   02/08/23 2132 02/09/23 0456 02/09/23 0618 02/09/23 0816  BP: 111/81 109/83  104/66  Pulse: 70 69  76  Resp: 18 16  17   Temp:  98.1 F (36.7 C)  99.3 F (37.4 C)  TempSrc:  Oral  Oral  SpO2: 99% 96%  100%  Weight:   66.2 kg   Height:        General exam: Elderly female, moderately built and thinly nourished sitting up in bed without obvious distress.  Looks much improved and comfortable compared to yesterday. Respiratory system: Clear to auscultation. Respiratory effort normal. Cardiovascular system: S1 & S2 heard, RRR. No JVD, murmurs, rubs, gallops or clicks. No pedal edema.  Telemetry personally reviewed: A-fib with controlled ventricular rate in the 50s mostly,  sometimes in the 60s.  No pauses since yesterday. Gastrointestinal system: Abdomen is nondistended, soft and nontender. No organomegaly or masses felt. Normal bowel sounds heard. Central nervous system: Alert and oriented. No focal neurological deficits. Extremities: Symmetric 5 x 5 power except left upper extremity where was not checked due to pain.  Now has a sling in place.  Neurovascular bundle intact.  Able to move fingers well.  Compartments soft. Skin: No rashes, lesions or ulcers Psychiatry: Judgement and insight appear normal. Mood & affect appropriate.     The results of significant diagnostics from this hospitalization (including imaging, microbiology, ancillary and laboratory) are listed below for reference.     Microbiology: No results found for this or any previous visit (from the past 240 hour(s)).   Labs: CBC: Recent Labs  Lab 02/07/23 1148 02/08/23 0701 02/09/23 0031  WBC 4.9 8.2 6.2  HGB 11.2*  10.9* 11.7* 10.0*  HCT 33.2*  32.0* 35.9* 30.7*  MCV 98.5 98.4 98.4  PLT 179 230 179    Basic Metabolic Panel: Recent Labs  Lab 02/07/23 1148 02/08/23 0701 02/09/23 0031  NA 135  136 134* 135  K 4.1  4.2 3.9 3.9  CL 108  105 102 107  CO2 20* 20* 20*  GLUCOSE 144*  140* 100* 109*  BUN 24*  25* 18 19  CREATININE 1.23*  1.20* 1.22* 1.05*  CALCIUM 8.3* 8.7* 8.6*    Liver Function Tests: Recent Labs  Lab 02/07/23 1148  AST 22  ALT 12  ALKPHOS 46  BILITOT 0.6  PROT 5.3*  ALBUMIN 3.0*    CBG: Recent Labs  Lab 02/08/23 0621  GLUCAP 89    Hgb A1c Recent Labs    02/08/23 0701  HGBA1C 5.5     Thyroid function studies Recent Labs    02/07/23 1820  TSH 1.685     Urinalysis    Component Value Date/Time   COLORURINE YELLOW 02/07/2023 1145   APPEARANCEUR HAZY (A) 02/07/2023 1145   LABSPEC 1.036 (H) 02/07/2023 1145   PHURINE 5.0 02/07/2023 1145   GLUCOSEU >=500 (A) 02/07/2023 1145   HGBUR NEGATIVE 02/07/2023 1145   BILIRUBINUR  NEGATIVE 02/07/2023 1145   KETONESUR NEGATIVE 02/07/2023 1145   PROTEINUR NEGATIVE 02/07/2023 1145  NITRITE NEGATIVE 02/07/2023 1145   LEUKOCYTESUR TRACE (A) 02/07/2023 1145    I discussed with patient and spouse via phone.  Updated care and answered all questions.  He stated that he wanted somebody to come home to be able to assist patient with dressing, bathing etc.  I have consulted TOC to provide resources.  Time coordinating discharge: 25 minutes  SIGNED:  Marcellus Scott, MD,  FACP, [facility], [facility], [facility], [facility]   [facility] [place]     To contact the attending provider between 7A-7P or the covering provider during after hours 7P-7A, please log into the web site [email or web address] and access using universal [place] password for that web site. If you do not have the password, please call the hospital operator.

## 2023-02-09 NOTE — Progress Notes (Signed)
Met with pt and husband. Husband is requesting assistance with bathing, dressing care, etc. HH PT/OT has been arranged with [facility]. Informed husband that pt can have a HH aide while she has HH therapy and it depends if the agency has an aide available. Informed Denyse Amass with [facility] that pt has been DC and she needs a HH aide. Provided husband with a list of in home health care agencies. Informed husband that this is an out-of-pocket expense. He verbalized understanding.

## 2023-02-09 NOTE — Progress Notes (Signed)
Occupational Therapy Treatment Patient Details Name: Hailey [name] MRN: [id number] DOB: 24-May-1935 Today's Date: 02/09/2023   History of present illness 88 yo female with hx of falls was admitted 5/30 for trip injury and landed on L shoulder.  Has left humeral head and neck fracture, non surgical management. Was found to be hypotensive and bradycardic initially.  PMHx:  stroke, anemia, permanent a-fib, CHF, DM, ascending aortic aneurysm, atherosclerosis, T10 compression fracture, diverticulosis,   OT comments  Pt and spouse nervous about DC home, husband seeking additional assist to help wife when needed. Discussed with patient and spouse the use of long handled sponge to assist with bathing to calm fear of falling in bathroom, Pt seemed to be more at ease. Educated pt on first in last out technique for upper body dressing, pt successfully used technique to don gown, pt also demonstrated use of figure four to don lower body while using LUE. Reinforced pt use RUE within functional tasks as allowed (hands and wrist) and encouraged AROM of R hand and wrist to protect joint integrity. Continued education on donning/doffing sling with spouse, handout provided as well to encourage carryover. OT to continue to progress pt as able, DC plans appropriate for pt to follow-up with outpatient OT when able to progress with RUE post acutely.    Recommendations for follow up therapy are one component of a multi-disciplinary discharge planning process, led by the attending physician.  Recommendations may be updated based on patient status, additional functional criteria and insurance authorization.    Assistance Recommended at Discharge PRN  Patient can return home with the following  A little help with walking and/or transfers;A little help with bathing/dressing/bathroom;Assistance with cooking/housework;Assist for transportation   Equipment Recommendations  None recommended by OT    Recommendations for Other  Services      Precautions / Restrictions Precautions Precautions: Fall Precaution Comments: LUE humeral fracture Required Braces or Orthoses: Sling Restrictions Weight Bearing Restrictions: Yes LUE Weight Bearing: Non weight bearing Other Position/Activity Restrictions: wear LUE sling at all times except B/D tasks       Mobility Bed Mobility   Bed Mobility: Supine to Sit     Supine to sit: Supervision     General bed mobility comments: Pt left to DC with NT at end of session    Transfers Overall transfer level: Needs assistance Equipment used: None Transfers: Sit to/from Stand Sit to Stand: Min guard                 Balance Overall balance assessment: Needs assistance, History of Falls Sitting-balance support: Feet supported Sitting balance-Leahy Scale: Fair     Standing balance support: Single extremity supported Standing balance-Leahy Scale: Fair                             ADL either performed or assessed with clinical judgement   ADL                     Upper Body Dressing Details (indicate cue type and reason): Educated pt on hemi dressing techniques, Pt successfully donned gown/shirt with min guard assist and cues for new learning Lower Body Dressing: Min guard;Sitting/lateral leans Lower Body Dressing Details (indicate cue type and reason): Educated pt on hemi dressing techniques, Pt successfully doffed/donned bilat socks with LUE only and donned pants and underwear with min guard assist using figure four technique  General ADL Comments: Focued session on dressing strategies and family education on doff/don sling    Extremity/Trunk Assessment              Vision       Perception     Praxis      Cognition Arousal/Alertness: Awake/alert Behavior During Therapy: WFL for tasks assessed/performed Overall Cognitive Status: Within Functional Limits for tasks assessed                                           Exercises Hand Exercises Wrist Flexion: AROM, 5 reps, Right Wrist Extension: Right, AROM, 5 reps Digit Composite Flexion: AROM, Right, 5 reps    Shoulder Instructions       General Comments Pt reported feeling "woozy" due to taking medication prior to session    Pertinent Vitals/ Pain       Pain Assessment Pain Assessment: Faces Faces Pain Scale: Hurts little more Pain Location: L arm Pain Descriptors / Indicators: Discomfort, Grimacing, Guarding Pain Intervention(s): Monitored during session, Repositioned  Home Living                                          Prior Functioning/Environment              Frequency  Min 2X/week        Progress Toward Goals  OT Goals(current goals can now be found in the care plan section)  Progress towards OT goals: Progressing toward goals  Acute Rehab OT Goals OT Goal Formulation: With patient Time For Goal Achievement: 02/15/23 Potential to Achieve Goals: Good  Plan Discharge plan remains appropriate;Frequency remains appropriate    Co-evaluation                 AM-PAC OT "6 Clicks" Daily Activity     Outcome Measure   Help from another person eating meals?: A Little Help from another person taking care of personal grooming?: A Little Help from another person toileting, which includes using toliet, bedpan, or urinal?: A Little Help from another person bathing (including washing, rinsing, drying)?: A Little Help from another person to put on and taking off regular upper body clothing?: A Little Help from another person to put on and taking off regular lower body clothing?: A Little 6 Click Score: 18    End of Session Equipment Utilized During Treatment: Other (comment) (sling)  OT Visit Diagnosis: Unsteadiness on feet (R26.81);Pain;Muscle weakness (generalized) (M62.81) Pain - Right/Left: Left Pain - part of body: Shoulder   Activity Tolerance Patient tolerated  treatment well   Patient Left Other (comment) (in transport chair with NT for DC)   Nurse Communication Mobility status        Time: 1210-1240 OT Time Calculation (min): 30 min  Charges: OT General Charges $OT Visit: 1 Visit OT Treatments $Self Care/Home Management : 8-22 mins $Therapeutic Activity: 8-22 mins  02/09/2023  AB, OTR/L  Acute Rehabilitation Services  Office: [phone]   Tristan Schroeder 02/09/2023, 1:11 PM

## 2023-02-09 NOTE — Progress Notes (Signed)
Patient is being discharged home. All discharge instructions reviewed including new medications. Case management also provided additional resources to patient. Patient and patients husband verbalized a full understanding of discharge instructions. Pt husband is her ride home.

## 2023-02-11 ENCOUNTER — Encounter: Payer: Self-pay | Admitting: Hematology

## 2023-02-11 ENCOUNTER — Encounter: Payer: Self-pay | Admitting: Obstetrics and Gynecology

## 2023-02-11 DIAGNOSIS — I482 Chronic atrial fibrillation, unspecified: Secondary | ICD-10-CM | POA: Diagnosis not present

## 2023-02-11 DIAGNOSIS — R001 Bradycardia, unspecified: Secondary | ICD-10-CM

## 2023-02-14 ENCOUNTER — Ambulatory Visit: Payer: Medicare Other

## 2023-02-14 ENCOUNTER — Telehealth: Payer: Self-pay | Admitting: Hematology

## 2023-02-14 NOTE — Telephone Encounter (Signed)
Patient has cancelled their appointment due to her shoulder being broken, she will call back to reschedule her appointment when she is out of recovery

## 2023-02-15 ENCOUNTER — Telehealth: Payer: Self-pay | Admitting: Internal Medicine

## 2023-02-15 DIAGNOSIS — S42292D Other displaced fracture of upper end of left humerus, subsequent encounter for fracture with routine healing: Secondary | ICD-10-CM | POA: Diagnosis not present

## 2023-02-15 DIAGNOSIS — S42212D Unspecified displaced fracture of surgical neck of left humerus, subsequent encounter for fracture with routine healing: Secondary | ICD-10-CM | POA: Diagnosis not present

## 2023-02-15 DIAGNOSIS — I110 Hypertensive heart disease with heart failure: Secondary | ICD-10-CM | POA: Diagnosis not present

## 2023-02-15 DIAGNOSIS — I2510 Atherosclerotic heart disease of native coronary artery without angina pectoris: Secondary | ICD-10-CM | POA: Diagnosis not present

## 2023-02-15 DIAGNOSIS — I5032 Chronic diastolic (congestive) heart failure: Secondary | ICD-10-CM | POA: Diagnosis not present

## 2023-02-15 NOTE — Telephone Encounter (Signed)
Onalee Hua from [facility] was with the pt today for a HH physical therapy eval and reports that her BP was normal while sitting, but after standing and walking it rose to 140/100 and when checked again was at 150/100.  Pt is not currently taking any blood pressure medication because it was discontinued.   Onalee Hua also states the pt fell and was admitted to the hospital on 5.30.24. Pt is coming in for a HFU on 6.10.24  Please call Onalee Hua with any questions: [phone]

## 2023-02-18 ENCOUNTER — Inpatient Hospital Stay: Payer: Medicare Other

## 2023-02-18 ENCOUNTER — Ambulatory Visit (INDEPENDENT_AMBULATORY_CARE_PROVIDER_SITE_OTHER): Payer: Medicare Other | Admitting: Family Medicine

## 2023-02-18 ENCOUNTER — Inpatient Hospital Stay: Payer: Medicare Other | Admitting: Hematology

## 2023-02-18 ENCOUNTER — Telehealth: Payer: Self-pay | Admitting: Internal Medicine

## 2023-02-18 ENCOUNTER — Encounter: Payer: Self-pay | Admitting: Family Medicine

## 2023-02-18 VITALS — BP 108/70 | HR 60 | Temp 98.1°F | Resp 20 | Ht 63.0 in | Wt 150.0 lb

## 2023-02-18 DIAGNOSIS — I7121 Aneurysm of the ascending aorta, without rupture: Secondary | ICD-10-CM | POA: Diagnosis not present

## 2023-02-18 DIAGNOSIS — R001 Bradycardia, unspecified: Secondary | ICD-10-CM | POA: Diagnosis not present

## 2023-02-18 DIAGNOSIS — L03116 Cellulitis of left lower limb: Secondary | ICD-10-CM

## 2023-02-18 DIAGNOSIS — T148XXA Other injury of unspecified body region, initial encounter: Secondary | ICD-10-CM | POA: Diagnosis not present

## 2023-02-18 DIAGNOSIS — D649 Anemia, unspecified: Secondary | ICD-10-CM | POA: Diagnosis not present

## 2023-02-18 DIAGNOSIS — N1832 Chronic kidney disease, stage 3b: Secondary | ICD-10-CM

## 2023-02-18 DIAGNOSIS — E861 Hypovolemia: Secondary | ICD-10-CM

## 2023-02-18 DIAGNOSIS — S42352D Displaced comminuted fracture of shaft of humerus, left arm, subsequent encounter for fracture with routine healing: Secondary | ICD-10-CM

## 2023-02-18 LAB — CBC WITH DIFFERENTIAL/PLATELET
Basophils Absolute: 0 10*3/uL (ref 0.0–0.1)
Basophils Relative: 0.5 % (ref 0.0–3.0)
Eosinophils Absolute: 0.5 10*3/uL (ref 0.0–0.7)
Eosinophils Relative: 7.2 % — ABNORMAL HIGH (ref 0.0–5.0)
HCT: 33.3 % — ABNORMAL LOW (ref 36.0–46.0)
Hemoglobin: 11 g/dL — ABNORMAL LOW (ref 12.0–15.0)
Lymphocytes Relative: 18 % (ref 12.0–46.0)
Lymphs Abs: 1.3 10*3/uL (ref 0.7–4.0)
MCHC: 33.2 g/dL (ref 30.0–36.0)
MCV: 98.3 fl (ref 78.0–100.0)
Monocytes Absolute: 0.7 10*3/uL (ref 0.1–1.0)
Monocytes Relative: 9.5 % (ref 3.0–12.0)
Neutro Abs: 4.6 10*3/uL (ref 1.4–7.7)
Neutrophils Relative %: 64.8 % (ref 43.0–77.0)
Platelets: 381 10*3/uL (ref 150.0–400.0)
RBC: 3.39 Mil/uL — ABNORMAL LOW (ref 3.87–5.11)
RDW: 14.1 % (ref 11.5–15.5)
WBC: 7 10*3/uL (ref 4.0–10.5)

## 2023-02-18 LAB — LIPID PANEL
Cholesterol: 145 mg/dL (ref 0–200)
HDL: 63.6 mg/dL (ref >39.00)
LDL Cholesterol: 57 mg/dL (ref 0–99)
NonHDL: 81.79
Total CHOL/HDL Ratio: 2
Triglycerides: 125 mg/dL (ref 0.0–149.0)
VLDL: 25 mg/dL (ref 0.0–40.0)

## 2023-02-18 LAB — BASIC METABOLIC PANEL
BUN: 20 mg/dL (ref 6–23)
CO2: 25 mEq/L (ref 19–32)
Calcium: 9.2 mg/dL (ref 8.4–10.5)
Chloride: 103 mEq/L (ref 96–112)
Creatinine, Ser: 1.07 mg/dL (ref 0.40–1.20)
GFR: 47.12 mL/min — ABNORMAL LOW (ref >60.00)
Glucose, Bld: 91 mg/dL (ref 70–99)
Potassium: 4.3 mEq/L (ref 3.5–5.1)
Sodium: 137 mEq/L (ref 135–145)

## 2023-02-18 MED ORDER — DOXYCYCLINE HYCLATE 100 MG PO TABS
100.0000 mg | ORAL_TABLET | Freq: Two times a day (BID) | ORAL | 0 refills | Status: AC
Start: 2023-02-18 — End: 2023-02-25

## 2023-02-18 NOTE — Telephone Encounter (Signed)
Hailey [name] from [facility] is requesting a continuation of hh orders for:  1X for 4 weeks  Please call Hailey [name] to confirm:  [phone]

## 2023-02-18 NOTE — Telephone Encounter (Signed)
Per chart pt has made a hosp f/u w/ Brayton El, PA for today.Hailey KitchenRaechel Chute

## 2023-02-18 NOTE — Assessment & Plan Note (Signed)
Education provided on cellulitis.  Cleanse wound daily and keep covered.

## 2023-02-18 NOTE — Assessment & Plan Note (Signed)
Blood pressure looks good today.  Encourage patient to keep a log of her blood pressure once daily now until she sees cardiology on Friday.

## 2023-02-18 NOTE — Telephone Encounter (Signed)
Okay.  Thanks.

## 2023-02-18 NOTE — Assessment & Plan Note (Signed)
Continue to wear sling and remain nonweightbearing.  Encouraged to keep follow-up with Dr. Carola Frost on 02/27/2023 as scheduled.

## 2023-02-18 NOTE — Assessment & Plan Note (Signed)
Patient is wearing a heart monitor currently that was shipped to her house after discharge.  Encouraged to keep upcoming appointment with cardiology on 02/22/2023.

## 2023-02-18 NOTE — Progress Notes (Signed)
Assessment & Plan:  Closed displaced comminuted fracture of shaft of left humerus with routine healing, subsequent encounter Assessment & Plan: Continue to wear sling and remain nonweightbearing.  Encouraged to keep follow-up with Dr. Carola Frost on 02/27/2023 as scheduled.  Orders: -     CBC with Differential/Platelet  Hypotension due to hypovolemia Assessment & Plan: Blood pressure looks good today.  Encourage patient to keep a log of her blood pressure once daily now until she sees cardiology on Friday.  Orders: -     Basic metabolic panel -     CBC with Differential/Platelet  Bradycardia Assessment & Plan: Patient is wearing a heart monitor currently that was shipped to her house after discharge.  Encouraged to keep upcoming appointment with cardiology on 02/22/2023.   Normocytic anemia -     CBC with Differential/Platelet  Chronic kidney disease, stage 3b (HCC) -     Basic metabolic panel  Aneurysm of ascending aorta without rupture [facility]) Assessment & Plan: Discussed diagnosis as it was not mentioned to her during her hospital stay.  Education provided.  Encourage patient to keep blood pressure under control.  She is not currently taking a statin, but her last lipid panel was normal.  Recommended follow-up imaging in 1 year.  Orders: -     Lipid panel  Bruising Assessment & Plan: May continue to use Arnicare Gel.  Discussed she had a traumatic injury and is taking blood thinners; this can be expected as gravity will pull the blood down her arm.  However, we will repeat her CBC to ensure her hemoglobin is not continuing to drop indicating that she is still bleeding.  Orders: -     CBC with Differential/Platelet  Cellulitis of left leg Assessment & Plan: Education provided on cellulitis.  Cleanse wound daily and keep covered.  Orders: -     Doxycycline Hyclate; Take 1 tablet (100 mg total) by mouth 2 (two) times daily for 7 days.  Dispense: 14 tablet; Refill:  0     Follow up plan: Return for as scheduled with PCP.  Deliah Boston, MSN, APRN, FNP-C  Subjective:  HPI: Hailey [name] is a 88 y.o. female presenting on 02/18/2023 for Hospitalization Follow-up ([facility] - 5/30- admission until 6/1 discharge - fall, hypotension )  Patient is accompanied by her husband, who she is okay with being present.  Follow up Hospitalization  Patient was admitted to [facility] on 02/07/2023 and discharged on 02/09/2023.  She was treated for a fracture of the left humerus, hypotension, and bradycardia. Treatment: - Left humerus fracture: Ortho recommended nonoperative management, nonweightbearing, sling, and outpatient follow-up with Dr. Carola Frost in 2 weeks which is scheduled for 02/27/2023.  - Hypotension: Metoprolol and losartan stopped in the hospital and at discharge.  We are - Bradycardia: Metoprolol stopped in the hospital and at discharge.  Cardiology will arrange a 30-day event monitor as an outpatient.  Patient does have A-Fib.  Patient has an appointment with cardiology scheduled for 02/22/2023. - Normocytic anemia: Hemoglobin dropped from 11.7 down to 10.  Recommended CBC as an outpatient. - CKD: Recommended BMP as an outpatient. - AAA: 4.4 cm ascending thoracic aortic aneurysm noted incidentally; recommended annual imaging for follow-up.  Telephone follow up was not completed.  Home health ordered for PT and OT.  Patient reports they have been out.  Her physical therapist compared her blood pressure cuff for accuracy.  However she has not been checking her blood pressure.  She has been wearing  her sling as advised.  She is very concerned about the ankle bruising all over the left upper arm.  She feels it is getting worse.  She has been applying Arnicare Gel, which she does not feel is effective.  Her concern today is that while they were getting her in the bed at the hospital something stuck her leg.  She has been cleaning it daily and covering it with a  Band-Aid.  However, her leg has become swollen.    ROS: Negative unless specifically indicated above in HPI.   Relevant past medical history reviewed and updated as indicated.   Allergies and medications reviewed and updated.   Current Outpatient Medications:    acetaminophen (TYLENOL) 500 MG tablet, Take 1,000 mg by mouth every 6 (six) hours as needed (body aches / sore hip). Maximum of 6 tablets daily (3000mg ), Disp: , Rfl:    apixaban (ELIQUIS) 2.5 MG TABS tablet, Take 2.5 mg by mouth 2 (two) times daily., Disp: , Rfl:    BIOTIN PO, Take 5,000 mcg by mouth in the morning., Disp: , Rfl:    diclofenac Sodium (VOLTAREN) 1 % GEL, Apply 2 g topically 4 (four) times daily as needed (back pain.)., Disp: , Rfl:    empagliflozin (JARDIANCE) 10 MG TABS tablet, Take 1 tablet (10 mg total) by mouth daily before breakfast., Disp: 30 tablet, Rfl: 9   escitalopram (LEXAPRO) 5 MG tablet, Take 5 mg by mouth every evening., Disp: , Rfl:    esomeprazole (NEXIUM) 40 MG capsule, Take 40 mg by mouth daily at 12 noon., Disp: , Rfl:    GABAPENTIN EX, Apply 1 Application topically 3 (three) times daily as needed (To vulva). Medication: Gabapentin 6% cream, Disp: , Rfl:    LORazepam (ATIVAN) 1 MG tablet, Take 2 mg by mouth at bedtime., Disp: , Rfl:    methocarbamol (ROBAXIN) 500 MG tablet, Take 1 tablet (500 mg total) by mouth every 8 (eight) hours as needed for muscle spasms., Disp: 30 tablet, Rfl: 0   oxyCODONE (OXY IR/ROXICODONE) 5 MG immediate release tablet, Take 1 tablet (5 mg total) by mouth every 6 (six) hours as needed for moderate pain or severe pain., Disp: 20 tablet, Rfl: 0   Polyethyl Glycol-Propyl Glycol (LUBRICANT EYE DROPS) 0.4-0.3 % SOLN, Place 1-2 drops into both eyes at bedtime., Disp: , Rfl:    senna-docusate (SENOKOT-S) 8.6-50 MG tablet, Take 1 tablet by mouth at bedtime as needed for mild constipation or moderate constipation., Disp: 30 tablet, Rfl: 0   sodium chloride (OCEAN) 0.65 % SOLN  nasal spray, Place 1 spray into both nostrils at bedtime as needed for congestion., Disp: , Rfl:    dicyclomine (BENTYL) 20 MG tablet, Take 1 by mouth every 4-6 hours as needed for cramping (Patient taking differently: Take 20 mg by mouth 4 (four) times daily -  before meals and at bedtime. Take 1 by mouth every 4-6 hours as needed for cramping), Disp: 30 tablet, Rfl: 3   Homeopathic Products (ARNICARE) GEL, Apply 1 application topically daily as needed (skin bruising). (Patient not taking: Reported on 02/18/2023), Disp: , Rfl:    hyoscyamine (LEVSIN) 0.125 MG tablet, TAKE 1-2 TABLETS (0.125-0.25 MG) BY MOUTH EVERY 4 HOURS AS NEEDED FOR UP TO 10 DAYS FOR CRAMPING. (Patient taking differently: Take 0.125-0.25 mg by mouth every 4 (four) hours as needed for cramping.), Disp: 100 tablet, Rfl: 1   meclizine (ANTIVERT) 12.5 MG tablet, Take 1 tablet (12.5 mg total) by mouth 3 (three) times  daily as needed for dizziness. (Patient not taking: Reported on 02/18/2023), Disp: 60 tablet, Rfl: 1   nitroGLYCERIN (NITROSTAT) 0.4 MG SL tablet, Place 1 tablet (0.4 mg total) under the tongue every 5 (five) minutes as needed for chest pain (Call 911 at 3rd dose within 15 minutes.). (Patient not taking: Reported on 02/18/2023), Disp: 20 tablet, Rfl: 3   ondansetron (ZOFRAN-ODT) 8 MG disintegrating tablet, Take 1 tablet (8 mg total) by mouth every 8 (eight) hours as needed for nausea or vomiting. (Patient not taking: Reported on 02/18/2023), Disp: 12 tablet, Rfl: 3  Allergies  Allergen Reactions   Macrobid [Nitrofurantoin Monohyd Macro] Other (See Comments)    Nausea, stomach cramps, fatigue , headache.   Meloxicam Other (See Comments)    Jittery and headache   Digoxin And Related Other (See Comments)    headaches   Diprolene [Betamethasone Dipropionate Aug] Other (See Comments)    Patient states that the bruising was made worse when using this   Ferrous Sulfate Other (See Comments)    Bad constipation    Keflex  [Cephalexin] Nausea And Vomiting    Nausea, fatigue, and headache.   Lipitor [Atorvastatin]     Abd pain   Mobic [Meloxicam] Other (See Comments)    Unsure of reaction type   Sulfamethoxazole-Trimethoprim Nausea Only    Objective:   BP 108/70   Pulse 60   Temp 98.1 F (36.7 C)   Resp 20   Ht 5\' 3"  (1.6 m)   Wt 150 lb (68 kg)   BMI 26.57 kg/m    Physical Exam Vitals reviewed.  Constitutional:      General: She is not in acute distress.    Appearance: Normal appearance. She is not ill-appearing, toxic-appearing or diaphoretic.  HENT:     Head: Normocephalic and atraumatic.  Eyes:     General: No scleral icterus.       Right eye: No discharge.        Left eye: No discharge.     Conjunctiva/sclera: Conjunctivae normal.  Cardiovascular:     Rate and Rhythm: Normal rate and regular rhythm.     Heart sounds: Normal heart sounds. No murmur heard.    No friction rub. No gallop.  Pulmonary:     Effort: Pulmonary effort is normal. No respiratory distress.     Breath sounds: Normal breath sounds. No stridor. No wheezing, rhonchi or rales.  Musculoskeletal:     Left shoulder: Swelling present. Decreased range of motion.     Cervical back: Normal range of motion.  Skin:    General: Skin is warm and dry.     Capillary Refill: Capillary refill takes less than 2 seconds.     Findings: Ecchymosis (LUE) and wound (LLE with erythema, warmth, and swelling) present.  Neurological:     General: No focal deficit present.     Mental Status: She is alert and oriented to person, place, and time. Mental status is at baseline.  Psychiatric:        Mood and Affect: Mood normal.        Behavior: Behavior normal.        Thought Content: Thought content normal.        Judgment: Judgment normal.

## 2023-02-18 NOTE — Patient Instructions (Addendum)
Monitor blood pressure once daily until you see cardiology on Friday.   Cleanse wound daily and keep covered.

## 2023-02-18 NOTE — Assessment & Plan Note (Signed)
May continue to use Arnicare Gel.  Discussed she had a traumatic injury and is taking blood thinners; this can be expected as gravity will pull the blood down her arm.  However, we will repeat her CBC to ensure her hemoglobin is not continuing to drop indicating that she is still bleeding.

## 2023-02-18 NOTE — Assessment & Plan Note (Signed)
Discussed diagnosis as it was not mentioned to her during her hospital stay.  Education provided.  Encourage patient to keep blood pressure under control.  She is not currently taking a statin, but her last lipid panel was normal.  Recommended follow-up imaging in 1 year.

## 2023-02-19 NOTE — Telephone Encounter (Signed)
Notified David w/MD response.../lmb 

## 2023-02-20 ENCOUNTER — Ambulatory Visit: Payer: Medicare Other | Admitting: Obstetrics and Gynecology

## 2023-02-21 NOTE — Progress Notes (Addendum)
Cardiology Office Note:    Date:  02/22/2023  ID:  Hailey [name], DOB 1935-06-18, MRN [id number] PCP: Tresa Garter, MD  [facility] Providers Cardiologist:  None Electrophysiologist:  Lanier Prude, MD       Patient Profile:      Coronary artery disease S/p orbital atherectomy + 4 x 26 mm DES to pRCA on 07/02/17 LHC 06/26/17: 3v heavy Ca2++, pRCA 85-90, mRCA 50-60, dLM 25, mLAD 50, EF 60 Myoview 09/26/2018: EF 71, no ischemia or infarction, low risk  (HFpEF) heart failure with preserved ejection fraction  TTE 02/17/20: EF 65-70, no RWMA, mild LVH, Gr 2 DD, mod reduced RVSF, mod pulm hypertension, RVSP 58.7, mod BAE, mild MR, mod TR, AV sclerosis, RAP 8 TEE 08/24/21: EF 60-65, no RWMA, NL RVSF, NL PASP, severe LAE, small eff, mild MR, asc Aorta 40 mm Permanent atrial fibrillation  Attempted LAAOD (Watchman) in 08/2021 unsuccessful 2/2 unfavorable anatomy Mitral regurgitation Tricuspid regurgitation  Hx of falls Now on low dose Eliquis Chronic kidney disease Hypertension  Hx of orthostatic hypotension Right Bundle Branch Block  Supraventricular Tachycardia   Ascending aortic aneurysm  TTE 08/2021: 40 mm CT 02/07/23: 4.4 cm  Aortic atherosclerosis  Lung nodule CT 01/2023: 4 mm RUL      History of Present Illness:   Hailey [name] is a 88 y.o. female who returns for post hospital f/u. She is a prior patient of Dr. Katrinka Blazing. She was last seen by Dr. Shari Prows in 11/2022. Repeat TTE is planned in 08/2023 due to ascending thoracic aortic aneurysm and to f/u on TR, MR. She was admitted with a mechanical fall 5/30-6/1 resulting in a L humerus fx that is to be tx conservatively. She was noted to have low BP (60/30) and bradycardia w HR in the 30s. She was seen by Cardiology. She had received Fentanyl and it was thought that her low BP and HR were related to pain med Rx in setting of beta-blocker Rx. There was no indication for PPM. Metoprolol succinate was DC'd and a 30 day  monitor was to be arranged.   She is here today with her husband.  Since discharge from the hospital, she continues to have significant left shoulder pain.  She has had left upper extremity swelling and bruising/hematoma.  She saw primary care last week.  Follow-up hemoglobin was improved at 11.  She has not had chest discomfort.  She does have some shortness of breath with certain activities.  This is mild.  She has not had syncope, orthopnea.  She had a scratch on her leg from the hospital and developed cellulitis.  She is now on antibiotics.  ROS see HPI    Studies Reviewed:    EKG: Not done  Risk Assessment/Calculations:    CHA2DS2-VASc Score = 6   This indicates a 9.7% annual risk of stroke. The patient's score is based upon: CHF History: 1 HTN History: 1 Diabetes History: 0 Stroke History: 0 Vascular Disease History: 1 Age Score: 2 Gender Score: 1            Physical Exam:   VS:  BP 112/64   Pulse 86   Ht 5\' 3"  (1.6 m)   Wt 149 lb (67.6 kg)   SpO2 93%   BMI 26.39 kg/m    Wt Readings from Last 3 Encounters:  02/22/23 149 lb (67.6 kg)  02/18/23 150 lb (68 kg)  02/09/23 145 lb 15.1 oz (66.2 kg)    Constitutional:  Appearance: Healthy appearance. Not in distress.  Pulmonary:     Breath sounds: Normal breath sounds. No wheezing. No rales.  Cardiovascular:     Normal rate. Irregularly irregular rhythm.     Murmurs: There is no murmur.  Edema:    Peripheral edema present.    Pretibial: bilateral trace edema of the pretibial area. Musculoskeletal:     Left upper arm: Swelling: ecchymosis, hematoma.      ASSESSMENT AND PLAN:   Permanent atrial fibrillation (HCC) Rate controlled on exam. She had a recent admission with a fall and fractured her humerus. Her Metoprolol succinate was DC'd 2/2 low BP, low HR. Zio XT is pending. She is due to mail it in on Monday. She has been maintained on low dose Eliquis due to a hx of falls. Watchman attempt in 2022 was  unsuccessful. Her L arm has a hematoma. This is likely normal healing from her fx. She sees ortho next week. A f/u Hgb last week was improved. I do not think that she needs to hold Eliquis at this time. Continue Eliquis 2.5 mg twice daily. Will see what her monitor shows. She has seen Dr. Lynnette Caffey in the past and would like to f/u with him in the future in light of Dr. Devin Going departure.   Aneurysm of ascending aorta without rupture (HCC) 4.4 cm on CT in May 2024.  She has a follow-up echocardiogram in December 2024 pending.  Coronary artery disease involving native coronary artery of native heart with angina pectoris (HCC) History of DES to the RCA in 2018.  Myoview in 2020 was low risk.  She is not having symptoms to suggest angina.  She is not on aspirin as she is on Eliquis.  She stopped taking Lipitor secondary to gastrointestinal side effects.  LDL in June 2024 was close to optimal at 57.  She is not interested in resuming statin therapy at this time.  We can reconsider the addition of rosuvastatin if her LDL increases in the future.  Essential hypertension Losartan and metoprolol succinate stopped in the hospital secondary to low blood pressure.  Her blood pressure is optimal.  Continue off medication for now.  Mitral regurgitation Echo in 2021 with mild MR and moderate TR.  TEE in December 2022 with mild MR.  Follow-up echo pending in December 2024.  Bradycardia Heart rate increased on exam today.  ZIO XT monitor results pending.     Dispo:  Return in about 3 months (around 05/25/2023) for Routine Follow Up, w/ Dr. Lynnette Caffey.  Signed, Tereso Newcomer, PA-C

## 2023-02-22 ENCOUNTER — Encounter: Payer: Self-pay | Admitting: Physician Assistant

## 2023-02-22 ENCOUNTER — Ambulatory Visit: Payer: Medicare Other | Attending: Physician Assistant | Admitting: Physician Assistant

## 2023-02-22 VITALS — BP 112/64 | HR 86 | Ht 63.0 in | Wt 149.0 lb

## 2023-02-22 DIAGNOSIS — S42212D Unspecified displaced fracture of surgical neck of left humerus, subsequent encounter for fracture with routine healing: Secondary | ICD-10-CM | POA: Diagnosis not present

## 2023-02-22 DIAGNOSIS — I4821 Permanent atrial fibrillation: Secondary | ICD-10-CM | POA: Diagnosis not present

## 2023-02-22 DIAGNOSIS — R001 Bradycardia, unspecified: Secondary | ICD-10-CM

## 2023-02-22 DIAGNOSIS — I340 Nonrheumatic mitral (valve) insufficiency: Secondary | ICD-10-CM | POA: Insufficient documentation

## 2023-02-22 DIAGNOSIS — I7121 Aneurysm of the ascending aorta, without rupture: Secondary | ICD-10-CM

## 2023-02-22 DIAGNOSIS — I25119 Atherosclerotic heart disease of native coronary artery with unspecified angina pectoris: Secondary | ICD-10-CM | POA: Diagnosis not present

## 2023-02-22 DIAGNOSIS — I10 Essential (primary) hypertension: Secondary | ICD-10-CM

## 2023-02-22 DIAGNOSIS — I110 Hypertensive heart disease with heart failure: Secondary | ICD-10-CM | POA: Diagnosis not present

## 2023-02-22 DIAGNOSIS — I5032 Chronic diastolic (congestive) heart failure: Secondary | ICD-10-CM | POA: Diagnosis not present

## 2023-02-22 DIAGNOSIS — I2510 Atherosclerotic heart disease of native coronary artery without angina pectoris: Secondary | ICD-10-CM | POA: Diagnosis not present

## 2023-02-22 DIAGNOSIS — S42292D Other displaced fracture of upper end of left humerus, subsequent encounter for fracture with routine healing: Secondary | ICD-10-CM | POA: Diagnosis not present

## 2023-02-22 NOTE — Patient Instructions (Signed)
Medication Instructions:  Your physician recommends that you continue on your current medications as directed. Please refer to the Current Medication list given to you today.  *If you need a refill on your cardiac medications before your next appointment, please call your pharmacy*   Lab Work: None ordered  If you have labs (blood work) drawn today and your tests are completely normal, you will receive your results only by: MyChart Message (if you have MyChart) OR A paper copy in the mail If you have any lab test that is abnormal or we need to change your treatment, we will call you to review the results.   Testing/Procedures: None ordered   Follow-Up: At [facility], you and your health needs are our priority.  As part of our continuing mission to provide you with exceptional heart care, we have created designated Provider Care Teams.  These Care Teams include your primary Cardiologist (physician) and Advanced Practice Providers (APPs -  Physician Assistants and Nurse Practitioners) who all work together to provide you with the care you need, when you need it.  We recommend signing up for the patient portal called "MyChart".  Sign up information is provided on this After Visit Summary.  MyChart is used to connect with patients for Virtual Visits (Telemedicine).  Patients are able to view lab/test results, encounter notes, upcoming appointments, etc.  Non-urgent messages can be sent to your provider as well.   To learn more about what you can do with MyChart, go to [email or web address].    Your next appointment:   4 month(s)  Provider:   Alverda Skeans, MD     Other Instructions

## 2023-02-22 NOTE — Assessment & Plan Note (Signed)
4.4 cm on CT in May 2024.  She has a follow-up echocardiogram in December 2024 pending.

## 2023-02-22 NOTE — Assessment & Plan Note (Signed)
History of DES to the RCA in 2018.  Myoview in 2020 was low risk.  She is not having symptoms to suggest angina.  She is not on aspirin as she is on Eliquis.  She stopped taking Lipitor secondary to gastrointestinal side effects.  LDL in June 2024 was close to optimal at 57.  She is not interested in resuming statin therapy at this time.  We can reconsider the addition of rosuvastatin if her LDL increases in the future.

## 2023-02-22 NOTE — Assessment & Plan Note (Signed)
Echo in 2021 with mild MR and moderate TR.  TEE in December 2022 with mild MR.  Follow-up echo pending in December 2024.

## 2023-02-22 NOTE — Assessment & Plan Note (Signed)
Heart rate increased on exam today.  ZIO XT monitor results pending.

## 2023-02-22 NOTE — Assessment & Plan Note (Signed)
Losartan and metoprolol succinate stopped in the hospital secondary to low blood pressure.  Her blood pressure is optimal.  Continue off medication for now.

## 2023-02-22 NOTE — Assessment & Plan Note (Addendum)
Rate controlled on exam. She had a recent admission with a fall and fractured her humerus. Her Metoprolol succinate was DC'd 2/2 low BP, low HR. Zio XT is pending. She is due to mail it in on Monday. She has been maintained on low dose Eliquis due to a hx of falls. Watchman attempt in 2022 was unsuccessful. Her L arm has a hematoma. This is likely normal healing from her fx. She sees ortho next week. A f/u Hgb last week was improved. I do not think that she needs to hold Eliquis at this time. Continue Eliquis 2.5 mg twice daily. Will see what her monitor shows. She has seen Dr. Lynnette Caffey in the past and would like to f/u with him in the future in light of Dr. Devin Going departure.

## 2023-02-26 ENCOUNTER — Ambulatory Visit (INDEPENDENT_AMBULATORY_CARE_PROVIDER_SITE_OTHER): Payer: Medicare Other

## 2023-02-26 VITALS — Ht 63.0 in | Wt 143.0 lb

## 2023-02-26 DIAGNOSIS — Z0000 Encounter for general adult medical examination without abnormal findings: Secondary | ICD-10-CM

## 2023-02-26 NOTE — Progress Notes (Addendum)
Subjective:   Hailey [name] is a 88 y.o. female who presents for Medicare Annual (Subsequent) preventive examination.  Visit Complete: Virtual  I connected with  Hailey [name] on 02/26/23 by a audio enabled telemedicine application and verified that I am speaking with the correct person using two identifiers.  Patient Location: Home  Provider Location: Office/Clinic  I discussed the limitations of evaluation and management by telemedicine. The patient expressed understanding and agreed to proceed.  Review of Systems     Cardiac Risk Factors include: advanced age (>56men, >73 women)     Objective:    Today's Vitals   02/26/23 1401  Weight: 143 lb (64.9 kg)  Height: 5\' 3"  (1.6 m)  PainSc: 7   PainLoc: Shoulder   Body mass index is 25.33 kg/m.     02/26/2023    2:04 PM 02/07/2023   11:38 AM 05/17/2022   10:01 AM 04/24/2022    9:00 PM 02/14/2022   10:34 AM 11/19/2021    8:58 PM 08/24/2021    8:00 AM  Advanced Directives  Does Patient Have a Medical Advance Directive? Yes Unable to assess, patient is non-responsive or altered mental status No No Yes Yes Yes  Type of Estate agent of [place];Living will    Healthcare Power of [employer] of [place];Living will Healthcare Power of [place];Living will  Does patient want to make changes to medical advance directive? No - Patient declined        Copy of Healthcare Power of Attorney in Chart? Yes - validated most recent copy scanned in chart (See row information)    No - copy requested      Current Medications (verified) Outpatient Encounter Medications as of 02/26/2023  Medication Sig   methocarbamol (ROBAXIN) 500 MG tablet Take 1 tablet (500 mg total) by mouth every 8 (eight) hours as needed for muscle spasms.   acetaminophen (TYLENOL) 500 MG tablet Take 1,000 mg by mouth every 6 (six) hours as needed (body aches / sore hip). Maximum of 6 tablets daily (3000mg )   apixaban (ELIQUIS) 2.5 MG  TABS tablet Take 2.5 mg by mouth 2 (two) times daily.   BIOTIN PO Take 5,000 mcg by mouth in the morning.   diclofenac Sodium (VOLTAREN) 1 % GEL Apply 2 g topically 4 (four) times daily as needed (back pain.).   dicyclomine (BENTYL) 20 MG tablet Take 1 by mouth every 4-6 hours as needed for cramping   empagliflozin (JARDIANCE) 10 MG TABS tablet Take 1 tablet (10 mg total) by mouth daily before breakfast.   escitalopram (LEXAPRO) 5 MG tablet Take 5 mg by mouth every evening.   esomeprazole (NEXIUM) 40 MG capsule Take 40 mg by mouth daily at 12 noon.   GABAPENTIN EX Apply 1 Application topically 3 (three) times daily as needed (To vulva). Medication: Gabapentin 6% cream   Homeopathic Products (ARNICARE) GEL Apply 1 application  topically daily as needed (skin bruising).   hyoscyamine (LEVSIN) 0.125 MG tablet TAKE 1-2 TABLETS (0.125-0.25 MG) BY MOUTH EVERY 4 HOURS AS NEEDED FOR UP TO 10 DAYS FOR CRAMPING.   LORazepam (ATIVAN) 1 MG tablet Take 2 mg by mouth at bedtime.   meclizine (ANTIVERT) 12.5 MG tablet Take 1 tablet (12.5 mg total) by mouth 3 (three) times daily as needed for dizziness.   nitroGLYCERIN (NITROSTAT) 0.4 MG SL tablet Place 1 tablet (0.4 mg total) under the tongue every 5 (five) minutes as needed for chest pain (Call 911 at 3rd  dose within 15 minutes.).   ondansetron (ZOFRAN-ODT) 8 MG disintegrating tablet Take 1 tablet (8 mg total) by mouth every 8 (eight) hours as needed for nausea or vomiting.   Polyethyl Glycol-Propyl Glycol (LUBRICANT EYE DROPS) 0.4-0.3 % SOLN Place 1-2 drops into both eyes at bedtime.   polyethylene glycol (MIRALAX / GLYCOLAX) 17 g packet Take 17 g by mouth as needed for mild constipation or moderate constipation.   sodium chloride (OCEAN) 0.65 % SOLN nasal spray Place 1 spray into both nostrils at bedtime as needed for congestion.   [DISCONTINUED] oxyCODONE (OXY IR/ROXICODONE) 5 MG immediate release tablet Take 1 tablet (5 mg total) by mouth every 6 (six) hours  as needed for moderate pain or severe pain.   No facility-administered encounter medications on file as of 02/26/2023.    Allergies (verified) Macrobid [nitrofurantoin monohyd macro], Meloxicam, Digoxin and related, Diprolene [betamethasone dipropionate aug], Ferrous sulfate, Keflex [cephalexin], Lipitor [atorvastatin], Mobic [meloxicam], and Sulfamethoxazole-trimethoprim   History: Past Medical History:  Diagnosis Date   Acute blood loss anemia 12/25/2017   Allergic rhinitis    Anxiety    Arthritis    "my whole spine" (07/01/2017)   Arthritis    Atrial fibrillation (HCC)    Bowel obstruction (HCC)    in [place]   Bradycardia with 41-50 beats per minute 12/27/2017   Cancer (HCC)    Cellulitis of leg, right with large prepatella hematoma and open wounds 12/26/2017   CKD (chronic kidney disease) stage 2, GFR 60-89 ml/min    Colon polyps    Coronary artery disease    10/18 PCI/DES to p/m LCx with cutting balloon to mLcx   Diverticulosis of colon    GERD (gastroesophageal reflux disease)    Hip bursitis 2010   Dr Wyline Mood, Post op seroma   History of colon polyps    HSV (herpes simplex virus) anogenital infection 07/2019   HTN (hypertension)    IBS (irritable bowel syndrome)    constipation predominant - Dr Kinnie Scales   Lichen sclerosus    Osteopenia 11/2016   T score -2.0 FRAX 15%/4.3%   PAC (premature atrial contraction)    Symptomatiic   Renal insufficiency    Right bundle branch block (RBBB) on electrocardiogram (ECG) 12/27/2017   Scoliosis    SVT (supraventricular tachycardia)    brief history   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   biopsy showing vulvar atypia, possible VIN I   Past Surgical History:  Procedure Laterality Date   ANTERIOR AND POSTERIOR VAGINAL REPAIR  01/2002   Hattie Perch 01/23/2011   APPENDECTOMY  1948   CARDIAC CATHETERIZATION  06/26/2017   CORONARY ANGIOPLASTY WITH STENT PLACEMENT  07/01/2017   CORONARY ATHERECTOMY N/A 07/01/2017    Procedure: CORONARY ATHERECTOMY;  Surgeon: Lyn Records, MD;  Location: [facility] INVASIVE CV LAB;  Service: Cardiovascular;  Laterality: N/A;   CORONARY STENT INTERVENTION N/A 07/01/2017   Procedure: CORONARY STENT INTERVENTION;  Surgeon: Lyn Records, MD;  Location: [facility] INVASIVE CV LAB;  Service: Cardiovascular;  Laterality: N/A;   HAMMER TOE SURGERY     HEMORRHOID BANDING     HIP SURGERY Left 04/2009   hip examination under anesthesia followed by greater trochanteric bursectomy; iliotibial band tenotomy/notes 01/20/2011   I & D EXTREMITY Right 01/10/2018   Procedure: IRRIGATION AND DEBRIDEMENT RIGHT KNEE, APPLY WOUND VAC;  Surgeon: Nadara Mustard, MD;  Location: [facility] OR;  Service: Orthopedics;  Laterality: Right;   KNEE BURSECTOMY Right 04/2009   Hattie Perch 01/09/2011  LEFT ATRIAL APPENDAGE OCCLUSION N/A 08/24/2021   Procedure: LEFT ATRIAL APPENDAGE OCCLUSION;  Surgeon: Tonny Bollman, MD;  Location[facility] INVASIVE CV LAB;  Service: Cardiovascular;  Laterality: N/A;   LEFT HEART CATH AND CORONARY ANGIOGRAPHY N/A 06/26/2017   Procedure: LEFT HEART CATH AND CORONARY ANGIOGRAPHY;  Surgeon: Lyn Records, MD;  Location: [facility] INVASIVE CV LAB;  Service: Cardiovascular;  Laterality: N/A;   PUBOVAGINAL SLING  01/2002   Hattie Perch 01/23/2011   REDUCTION MAMMAPLASTY     TEE WITHOUT CARDIOVERSION N/A 08/24/2021   Procedure: TRANSESOPHAGEAL ECHOCARDIOGRAM (TEE);  Surgeon: Tonny Bollman, MD;  Location: [facility] INVASIVE CV LAB;  Service: Cardiovascular;  Laterality: N/A;   TEMPORARY PACEMAKER N/A 07/01/2017   Procedure: TEMPORARY PACEMAKER;  Surgeon: Lyn Records, MD;  Location: [facility] INVASIVE CV LAB;  Service: Cardiovascular;  Laterality: N/A;   VAGINAL HYSTERECTOMY  01/2002   Vaginal hysterectomy, bilateral salpingo-oophorectomy/notes 01/23/2011   Family History  Problem Relation Age of Onset   Colon cancer Mother        Dx age 43, died at age 43   Diabetes Father    Prostate cancer Father    Prostate cancer Brother     Pancreatic cancer Brother    Stomach cancer Son    Social History   Socioeconomic History   Marital status: Married    Spouse name: [name]   Number of children: 2   Years of education: Not on file   Highest education level: Bachelor's degree (e.g., BA, AB, BS)  Occupational History   Occupation: Engineer, manufacturing systems: RETIRED  Tobacco Use   Smoking status: Never   Smokeless tobacco: Never  Vaping Use   Vaping Use: Never used  Substance and Sexual Activity   Alcohol use: Never    Comment: 7 vodka drinks a week   Drug use: Never   Sexual activity: Not Currently    Birth control/protection: Surgical    Comment: hysterectomy  Other Topics Concern   Not on file  Social History Narrative   ** Merged History Encounter **       Regular Exercise -  YES         Social Determinants of Health   Financial Resource Strain: Low Risk  (02/26/2023)   Overall Financial Resource Strain (CARDIA)    Difficulty of Paying Living Expenses: Not hard at all  Food Insecurity: No Food Insecurity (02/26/2023)   Hunger Vital Sign    Worried About Running Out of Food in the Last Year: Never true    Ran Out of Food in the Last Year: Never true  Transportation Needs: No Transportation Needs (02/26/2023)   PRAPARE - Administrator, Civil Service (Medical): No    Lack of Transportation (Non-Medical): No  Physical Activity: Insufficiently Active (02/26/2023)   Exercise Vital Sign    Days of Exercise per Week: 3 days    Minutes of Exercise per Session: 40 min  Stress: Stress Concern Present (02/26/2023)   [employer] of Occupational Health - Occupational Stress Questionnaire    Feeling of Stress : To some extent  Social Connections: Moderately Integrated (02/26/2023)   Social Connection and Isolation Panel [NHANES]    Frequency of Communication with Friends and Family: More than three times a week    Frequency of Social Gatherings with Friends and Family: More than three times a  week    Attends Religious Services: Never    Database administrator or Organizations: Yes    Attends  Engineer, structural: More than 4 times per year    Marital Status: Married    Tobacco Counseling Counseling given: Not Answered   Clinical Intake:  Pre-visit preparation completed: Yes  Pain : 0-10 Pain Score: 7  Pain Type: Chronic pain Pain Location: Shoulder Pain Orientation: Left     BMI - recorded: 25.33 Nutritional Status: BMI 25 -29 Overweight Nutritional Risks: None Diabetes: No  How often do you need to have someone help you when you read instructions, pamphlets, or other written materials from your doctor or pharmacy?: 1 - Never What is the last grade level you completed in school?: COLLEGE GRADUATE (BA DEGREE)  Interpreter Needed?: No  Information entered by :: Susie Cassette, LPN.   Activities of Daily Living    02/26/2023    2:08 PM  In your present state of health, do you have any difficulty performing the following activities:  Hearing? 1  Vision? 0  Difficulty concentrating or making decisions? 0  Walking or climbing stairs? 0  Dressing or bathing? 1  Doing errands, shopping? 1  Preparing Food and eating ? N  Using the Toilet? N  In the past six months, have you accidently leaked urine? Y  Do you have problems with loss of bowel control? N  Managing your Medications? N  Managing your Finances? N  Housekeeping or managing your Housekeeping? Y    Patient Care Team: Tresa Garter, MD as PCP - General (Internal Medicine) Lanier Prude, MD as PCP - Electrophysiology (Cardiology) Orbie Pyo, MD as PCP - Cardiology (Cardiology) Hilarie Fredrickson, MD as Consulting Physician (Gastroenterology) Mia Creek, MD as Consulting Physician (Ophthalmology) Szabat, Vinnie Level, [facility] (Inactive) as Pharmacist (Pharmacist) Johney Maine, MD as Consulting Physician (Hematology) Plotnikov, Georgina Quint, MD (Internal  Medicine) Orbie Pyo, MD (Cardiology)  Indicate any recent Medical Services you may have received from other than [facility] providers in the past year (date may be approximate).     Assessment:   This is a routine wellness examination for Hailey [name].  Hearing/Vision screen Hearing Screening - Comments:: Patient wears hearing aids. Vision Screening - Comments:: Wears rx glasses for reading/distance - up to date with routine eye exams with London Sheer, OD.   Dietary issues and exercise activities discussed:     Goals Addressed             This Visit's Progress    Increase physical activity        Depression Screen    02/26/2023    2:08 PM 02/18/2023    2:21 PM 12/31/2022    2:24 PM 11/27/2022   11:18 AM 10/01/2022    2:34 PM 02/14/2022   10:36 AM 02/09/2021    2:00 PM  PHQ 2/9 Scores  PHQ - 2 Score 0 0 0 0 0 1 0  PHQ- 9 Score 0  0   3     Fall Risk    02/26/2023    2:14 PM 02/26/2023    2:06 PM 02/18/2023    2:21 PM 12/31/2022    2:23 PM 11/27/2022   11:18 AM  Fall Risk   Falls in the past year? 1 1 1  0 0  Number falls in past yr: 0  0 0 0  Injury with Fall? 0  0 0 0  Risk for fall due to : History of fall(s)  History of fall(s)  No Fall Risks  Follow up Falls prevention discussed  Falls evaluation completed Falls evaluation  completed Falls evaluation completed    MEDICARE RISK AT HOME:  Medicare Risk at Home - 02/26/23 1413     Any stairs in or around the home? Yes   garage   If so, are there any without handrails? No    Home free of loose throw rugs in walkways, pet beds, electrical cords, etc? Yes    Adequate lighting in your home to reduce risk of falls? Yes    Life alert? No    Use of a cane, walker or w/c? No    Grab bars in the bathroom? Yes    Shower chair or bench in shower? Yes    Elevated toilet seat or a handicapped toilet? No             TIMED UP AND GO:  Was the test performed?  No    Cognitive Function:        02/26/2023    2:06 PM  02/14/2022   10:33 AM  6CIT Screen  What Year? 0 points 0 points  What month? 0 points 0 points  What time? 0 points 0 points  Count back from 20 0 points 0 points  Months in reverse 0 points 0 points  Repeat phrase 0 points 0 points  Total Score 0 points 0 points    Immunizations Immunization History  Administered Date(s) Administered   Influenza Split 06/18/2012   Influenza Whole 05/26/2008, 06/05/2010   Influenza, High Dose Seasonal PF 06/03/2014, 06/12/2017, 05/22/2018, 05/14/2019, 06/07/2020, 06/09/2022   Influenza,inj,Quad PF,6+ Mos 06/09/2015   Influenza-Unspecified 06/08/2013   PFIZER Comirnaty(Gray Top)Covid-19 Tri-Sucrose Vaccine 02/21/2021   PFIZER(Purple Top)SARS-COV-2 Vaccination 09/24/2019, 10/15/2019, 06/07/2020   Pneumococcal Conjugate-13 01/25/2014   Pneumococcal Polysaccharide-23 11/07/2018   Td 06/11/2007, 06/17/2007   Tdap 12/22/2017   Zoster, Live 12/01/2008    TDAP status: Up to date  Flu Vaccine status: Up to date  Pneumococcal vaccine status: Up to date  Covid-19 vaccine status: Completed vaccines  Qualifies for Shingles Vaccine? Yes   Zostavax completed Yes   Shingrix Completed?: No.    Education has been provided regarding the importance of this vaccine. Patient has been advised to call insurance company to determine out of pocket expense if they have not yet received this vaccine. Advised may also receive vaccine at local pharmacy or Health Dept. Verbalized acceptance and understanding.  Screening Tests Health Maintenance  Topic Date Due   Zoster Vaccines- Shingrix (1 of 2) Never done   COVID-19 Vaccine (5 - 2023-24 season) 05/11/2022   INFLUENZA VACCINE  04/11/2023   Medicare Annual Wellness (AWV)  02/26/2024   DTaP/Tdap/Td (4 - Td or Tdap) 12/23/2027   Pneumonia Vaccine 70+ Years old  Completed   DEXA SCAN  Completed   HPV VACCINES  Aged Out    Health Maintenance  Health Maintenance Due  Topic Date Due   Zoster Vaccines- Shingrix (1  of 2) Never done   COVID-19 Vaccine (5 - 2023-24 season) 05/11/2022    Colorectal cancer screening: Type of screening: FOBT/FIT. Completed 11/19/2021. Repeat every 1 years  Mammogram status: Completed 02/08/2022. Repeat every year  Bone Density status: Completed 11/08/2020. Results reflect: Bone density results: OSTEOPENIA. Repeat every 2-3 years.  Lung Cancer Screening: (Low Dose CT Chest recommended if Age 29-80 years, 20 pack-year currently smoking OR have quit w/in 15years.) does not qualify.   Lung Cancer Screening Referral: no  Additional Screening:  Hepatitis C Screening: does not qualify; Completed: no  Vision Screening: Recommended annual ophthalmology exams for  early detection of glaucoma and other disorders of the eye. Is the patient up to date with their annual eye exam?  Yes  Who is the provider or what is the name of the office in which the patient attends annual eye exams? London Sheer, OD. If pt is not established with a provider, would they like to be referred to a provider to establish care? No .   Dental Screening: Recommended annual dental exams for proper oral hygiene  Diabetic Foot Exam: N/A  Community Resource Referral / Chronic Care Management: CRR required this visit?  No   CCM required this visit?  No     Plan:     I have personally reviewed and noted the following in the patient's chart:   Medical and social history Use of alcohol, tobacco or illicit drugs  Current medications and supplements including opioid prescriptions. Patient is not currently taking opioid prescriptions. Functional ability and status Nutritional status Physical activity Advanced directives List of other physicians Hospitalizations, surgeries, and ER visits in previous 12 months Vitals Screenings to include cognitive, depression, and falls Referrals and appointments  In addition, I have reviewed and discussed with patient certain preventive protocols, quality metrics, and  best practice recommendations. A written personalized care plan for preventive services as well as general preventive health recommendations were provided to patient.     Mickeal Needy, LPN   1/61/0960   After Visit Summary: (Mail) Due to this being a telephonic visit, the after visit summary with patients personalized plan was offered to patient via mail   Nurse Notes: Normal cognitive status assessed by direct observation via telephone conversation by this Nurse Health Advisor. No abnormalities found.    Medical screening examination/treatment/procedure(s) were performed by non-physician practitioner and as supervising physician I was immediately available for consultation/collaboration.  I agree with above. Jacinta Shoe, MD

## 2023-02-26 NOTE — Patient Instructions (Addendum)
Hailey [name] , Thank you for taking time to come for your Medicare Wellness Visit. I appreciate your ongoing commitment to your health goals. Please review the following plan we discussed and let me know if I can assist you in the future.   These are the goals we discussed:  Goals      Increase physical activity        This is a list of the screening recommended for you and due dates:  Health Maintenance  Topic Date Due   Zoster (Shingles) Vaccine (1 of 2) Never done   COVID-19 Vaccine (5 - 2023-24 season) 05/11/2022   Flu Shot  04/11/2023   Medicare Annual Wellness Visit  02/26/2024   DTaP/Tdap/Td vaccine (4 - Td or Tdap) 12/23/2027   Pneumonia Vaccine  Completed   DEXA scan (bone density measurement)  Completed   HPV Vaccine  Aged Out    Advanced directives: Yes; documents are on file with [place].  Conditions/risks identified: Yes; Falls  Next appointment: It was nice speaking with you today!  Please follow up in one year for your annual wellness visit via telephone call with Nurse Percell Miller on 03/04/2024 at 1:00 p.m.  If you need to cancel or reschedule please call [phone].   Preventive Care 86 Years and Older, Female Preventive care refers to lifestyle choices and visits with your health care provider that can promote health and wellness. What does preventive care include? A yearly physical exam. This is also called an annual well check. Dental exams once or twice a year. Routine eye exams. Ask your health care provider how often you should have your eyes checked. Personal lifestyle choices, including: Daily care of your teeth and gums. Regular physical activity. Eating a healthy diet. Avoiding tobacco and drug use. Limiting alcohol use. Practicing safe sex. Taking low-dose aspirin every day. Taking vitamin and mineral supplements as recommended by your health care provider. What happens during an annual well check? The services and screenings done by your  health care provider during your annual well check will depend on your age, overall health, lifestyle risk factors, and family history of disease. Counseling  Your health care provider may ask you questions about your: Alcohol use. Tobacco use. Drug use. Emotional well-being. Home and relationship well-being. Sexual activity. Eating habits. History of falls. Memory and ability to understand (cognition). Work and work Astronomer. Reproductive health. Screening  You may have the following tests or measurements: Height, weight, and BMI. Blood pressure. Lipid and cholesterol levels. These may be checked every 5 years, or more frequently if you are over 17 years old. Skin check. Lung cancer screening. You may have this screening every year starting at age 84 if you have a 30-pack-year history of smoking and currently smoke or have quit within the past 15 years. Fecal occult blood test (FOBT) of the stool. You may have this test every year starting at age 20. Flexible sigmoidoscopy or colonoscopy. You may have a sigmoidoscopy every 5 years or a colonoscopy every 10 years starting at age 34. Hepatitis C blood test. Hepatitis B blood test. Sexually transmitted disease (STD) testing. Diabetes screening. This is done by checking your blood sugar (glucose) after you have not eaten for a while (fasting). You may have this done every 1-3 years. Bone density scan. This is done to screen for osteoporosis. You may have this done starting at age 39. Mammogram. This may be done every 1-2 years. Talk to your health care provider about how  often you should have regular mammograms. Talk with your health care provider about your test results, treatment options, and if necessary, the need for more tests. Vaccines  Your health care provider may recommend certain vaccines, such as: Influenza vaccine. This is recommended every year. Tetanus, diphtheria, and acellular pertussis (Tdap, Td) vaccine. You may  need a Td booster every 10 years. Zoster vaccine. You may need this after age 108. Pneumococcal 13-valent conjugate (PCV13) vaccine. One dose is recommended after age 61. Pneumococcal polysaccharide (PPSV23) vaccine. One dose is recommended after age 94. Talk to your health care provider about which screenings and vaccines you need and how often you need them. This information is not intended to replace advice given to you by your health care provider. Make sure you discuss any questions you have with your health care provider. Document Released: 09/23/2015 Document Revised: 05/16/2016 Document Reviewed: 06/28/2015 Elsevier Interactive Patient Education  2017 [place] Prevention in the Home Falls can cause injuries. They can happen to people of all ages. There are many things you can do to make your home safe and to help prevent falls. What can I do on the outside of my home? Regularly fix the edges of walkways and driveways and fix any cracks. Remove anything that might make you trip as you walk through a door, such as a raised step or threshold. Trim any bushes or trees on the path to your home. Use bright outdoor lighting. Clear any walking paths of anything that might make someone trip, such as rocks or tools. Regularly check to see if handrails are loose or broken. Make sure that both sides of any steps have handrails. Any raised decks and porches should have guardrails on the edges. Have any leaves, snow, or ice cleared regularly. Use sand or salt on walking paths during winter. Clean up any spills in your garage right away. This includes oil or grease spills. What can I do in the bathroom? Use night lights. Install grab bars by the toilet and in the tub and shower. Do not use towel bars as grab bars. Use non-skid mats or decals in the tub or shower. If you need to sit down in the shower, use a plastic, non-slip stool. Keep the floor dry. Clean up any water that spills on  the floor as soon as it happens. Remove soap buildup in the tub or shower regularly. Attach bath mats securely with double-sided non-slip rug tape. Do not have throw rugs and other things on the floor that can make you trip. What can I do in the bedroom? Use night lights. Make sure that you have a light by your bed that is easy to reach. Do not use any sheets or blankets that are too big for your bed. They should not hang down onto the floor. Have a firm chair that has side arms. You can use this for support while you get dressed. Do not have throw rugs and other things on the floor that can make you trip. What can I do in the kitchen? Clean up any spills right away. Avoid walking on wet floors. Keep items that you use a lot in easy-to-reach places. If you need to reach something above you, use a strong step stool that has a grab bar. Keep electrical cords out of the way. Do not use floor polish or wax that makes floors slippery. If you must use wax, use non-skid floor wax. Do not have throw rugs and other things  on the floor that can make you trip. What can I do with my stairs? Do not leave any items on the stairs. Make sure that there are handrails on both sides of the stairs and use them. Fix handrails that are broken or loose. Make sure that handrails are as long as the stairways. Check any carpeting to make sure that it is firmly attached to the stairs. Fix any carpet that is loose or worn. Avoid having throw rugs at the top or bottom of the stairs. If you do have throw rugs, attach them to the floor with carpet tape. Make sure that you have a light switch at the top of the stairs and the bottom of the stairs. If you do not have them, ask someone to add them for you. What else can I do to help prevent falls? Wear shoes that: Do not have high heels. Have rubber bottoms. Are comfortable and fit you well. Are closed at the toe. Do not wear sandals. If you use a stepladder: Make sure  that it is fully opened. Do not climb a closed stepladder. Make sure that both sides of the stepladder are locked into place. Ask someone to hold it for you, if possible. Clearly mark and make sure that you can see: Any grab bars or handrails. First and last steps. Where the edge of each step is. Use tools that help you move around (mobility aids) if they are needed. These include: Canes. Walkers. Scooters. Crutches. Turn on the lights when you go into a dark area. Replace any light bulbs as soon as they burn out. Set up your furniture so you have a clear path. Avoid moving your furniture around. If any of your floors are uneven, fix them. If there are any pets around you, be aware of where they are. Review your medicines with your doctor. Some medicines can make you feel dizzy. This can increase your chance of falling. Ask your doctor what other things that you can do to help prevent falls. This information is not intended to replace advice given to you by your health care provider. Make sure you discuss any questions you have with your health care provider. Document Released: 06/23/2009 Document Revised: 02/02/2016 Document Reviewed: 10/01/2014 Elsevier Interactive Patient Education  2017 [employer].

## 2023-02-27 DIAGNOSIS — I5032 Chronic diastolic (congestive) heart failure: Secondary | ICD-10-CM | POA: Diagnosis not present

## 2023-02-27 DIAGNOSIS — R911 Solitary pulmonary nodule: Secondary | ICD-10-CM

## 2023-02-27 DIAGNOSIS — I081 Rheumatic disorders of both mitral and tricuspid valves: Secondary | ICD-10-CM | POA: Diagnosis not present

## 2023-02-27 DIAGNOSIS — S42292D Other displaced fracture of upper end of left humerus, subsequent encounter for fracture with routine healing: Secondary | ICD-10-CM | POA: Diagnosis not present

## 2023-02-27 DIAGNOSIS — I700 Atherosclerosis of aorta: Secondary | ICD-10-CM | POA: Diagnosis not present

## 2023-02-27 DIAGNOSIS — D649 Anemia, unspecified: Secondary | ICD-10-CM

## 2023-02-27 DIAGNOSIS — I110 Hypertensive heart disease with heart failure: Secondary | ICD-10-CM | POA: Diagnosis not present

## 2023-02-27 DIAGNOSIS — Z7984 Long term (current) use of oral hypoglycemic drugs: Secondary | ICD-10-CM

## 2023-02-27 DIAGNOSIS — Z9181 History of falling: Secondary | ICD-10-CM

## 2023-02-27 DIAGNOSIS — Z7901 Long term (current) use of anticoagulants: Secondary | ICD-10-CM

## 2023-02-27 DIAGNOSIS — S42212D Unspecified displaced fracture of surgical neck of left humerus, subsequent encounter for fracture with routine healing: Secondary | ICD-10-CM | POA: Diagnosis not present

## 2023-02-27 DIAGNOSIS — Z955 Presence of coronary angioplasty implant and graft: Secondary | ICD-10-CM

## 2023-02-27 DIAGNOSIS — I4821 Permanent atrial fibrillation: Secondary | ICD-10-CM | POA: Diagnosis not present

## 2023-02-27 DIAGNOSIS — I2510 Atherosclerotic heart disease of native coronary artery without angina pectoris: Secondary | ICD-10-CM | POA: Diagnosis not present

## 2023-02-27 DIAGNOSIS — G629 Polyneuropathy, unspecified: Secondary | ICD-10-CM | POA: Diagnosis not present

## 2023-02-27 DIAGNOSIS — F32A Depression, unspecified: Secondary | ICD-10-CM | POA: Diagnosis not present

## 2023-02-27 DIAGNOSIS — I7121 Aneurysm of the ascending aorta, without rupture: Secondary | ICD-10-CM | POA: Diagnosis not present

## 2023-02-27 DIAGNOSIS — F419 Anxiety disorder, unspecified: Secondary | ICD-10-CM

## 2023-02-28 ENCOUNTER — Ambulatory Visit (INDEPENDENT_AMBULATORY_CARE_PROVIDER_SITE_OTHER): Payer: Medicare Other | Admitting: Internal Medicine

## 2023-02-28 ENCOUNTER — Encounter: Payer: Self-pay | Admitting: Internal Medicine

## 2023-02-28 VITALS — BP 124/84 | HR 84 | Temp 97.6°F | Ht 63.0 in | Wt 144.0 lb

## 2023-02-28 DIAGNOSIS — S42352D Displaced comminuted fracture of shaft of humerus, left arm, subsequent encounter for fracture with routine healing: Secondary | ICD-10-CM | POA: Diagnosis not present

## 2023-02-28 DIAGNOSIS — E538 Deficiency of other specified B group vitamins: Secondary | ICD-10-CM

## 2023-02-28 DIAGNOSIS — M810 Age-related osteoporosis without current pathological fracture: Secondary | ICD-10-CM

## 2023-02-28 DIAGNOSIS — I10 Essential (primary) hypertension: Secondary | ICD-10-CM

## 2023-02-28 NOTE — Assessment & Plan Note (Addendum)
Starting PT Try TENS unit Follow-up with Dr. Carola Frost

## 2023-02-28 NOTE — Progress Notes (Signed)
Subjective:  Patient ID: Hailey [name], female    DOB: 03-09-1935  Age: 88 y.o. MRN: [id number]  CC: No chief complaint on file.   HPI Hailey [name] presents for L   S/p mechanical fall on 02/07/23 and left humeral neck and head fracture - a noncandidate for surgical intervention.  Patient was put on left shoulder sling and initiate PT evaluation in AM  Outpatient Medications Prior to Visit  Medication Sig Dispense Refill   acetaminophen (TYLENOL) 500 MG tablet Take 1,000 mg by mouth every 6 (six) hours as needed (body aches / sore hip). Maximum of 6 tablets daily (3000mg )     apixaban (ELIQUIS) 2.5 MG TABS tablet Take 2.5 mg by mouth 2 (two) times daily.     BIOTIN PO Take 5,000 mcg by mouth in the morning.     diclofenac Sodium (VOLTAREN) 1 % GEL Apply 2 g topically 4 (four) times daily as needed (back pain.).     dicyclomine (BENTYL) 20 MG tablet Take 1 by mouth every 4-6 hours as needed for cramping 30 tablet 3   empagliflozin (JARDIANCE) 10 MG TABS tablet Take 1 tablet (10 mg total) by mouth daily before breakfast. 30 tablet 9   escitalopram (LEXAPRO) 5 MG tablet Take 5 mg by mouth every evening.     esomeprazole (NEXIUM) 40 MG capsule Take 40 mg by mouth daily at 12 noon.     GABAPENTIN EX Apply 1 Application topically 3 (three) times daily as needed (To vulva). Medication: Gabapentin 6% cream     Homeopathic Products (ARNICARE) GEL Apply 1 application  topically daily as needed (skin bruising).     hyoscyamine (LEVSIN) 0.125 MG tablet TAKE 1-2 TABLETS (0.125-0.25 MG) BY MOUTH EVERY 4 HOURS AS NEEDED FOR UP TO 10 DAYS FOR CRAMPING. 100 tablet 1   LORazepam (ATIVAN) 1 MG tablet Take 2 mg by mouth at bedtime.     meclizine (ANTIVERT) 12.5 MG tablet Take 1 tablet (12.5 mg total) by mouth 3 (three) times daily as needed for dizziness. 60 tablet 1   nitroGLYCERIN (NITROSTAT) 0.4 MG SL tablet Place 1 tablet (0.4 mg total) under the tongue every 5 (five) minutes as needed for chest pain  (Call 911 at 3rd dose within 15 minutes.). 20 tablet 3   ondansetron (ZOFRAN-ODT) 8 MG disintegrating tablet Take 1 tablet (8 mg total) by mouth every 8 (eight) hours as needed for nausea or vomiting. 12 tablet 3   Polyethyl Glycol-Propyl Glycol (LUBRICANT EYE DROPS) 0.4-0.3 % SOLN Place 1-2 drops into both eyes at bedtime.     polyethylene glycol (MIRALAX / GLYCOLAX) 17 g packet Take 17 g by mouth as needed for mild constipation or moderate constipation.     sodium chloride (OCEAN) 0.65 % SOLN nasal spray Place 1 spray into both nostrils at bedtime as needed for congestion.     methocarbamol (ROBAXIN) 500 MG tablet Take 1 tablet (500 mg total) by mouth every 8 (eight) hours as needed for muscle spasms. 30 tablet 0   No facility-administered medications prior to visit.    ROS: Review of Systems  Constitutional:  Negative for activity change, appetite change, chills, fatigue and unexpected weight change.  HENT:  Negative for congestion, mouth sores and sinus pressure.   Eyes:  Negative for visual disturbance.  Respiratory:  Negative for cough and chest tightness.   Gastrointestinal:  Negative for abdominal pain and nausea.  Genitourinary:  Negative for difficulty urinating, frequency and vaginal pain.  Musculoskeletal:  Positive for arthralgias, back pain and gait problem.  Skin:  Negative for pallor and rash.  Neurological:  Negative for dizziness, tremors, weakness, numbness and headaches.  Psychiatric/Behavioral:  Negative for confusion and sleep disturbance.     Objective:  BP 124/84 (BP Location: Right Arm, Patient Position: Sitting, Cuff Size: Normal)   Pulse 84   Temp 97.6 F (36.4 C) (Oral)   Ht 5\' 3"  (1.6 m)   Wt 144 lb (65.3 kg)   SpO2 96%   BMI 25.51 kg/m   BP Readings from Last 3 Encounters:  02/28/23 124/84  02/22/23 112/64  02/18/23 108/70    Wt Readings from Last 3 Encounters:  02/28/23 144 lb (65.3 kg)  02/26/23 143 lb (64.9 kg)  02/22/23 149 lb (67.6 kg)     Physical Exam Constitutional:      General: She is not in acute distress.    Appearance: Normal appearance. She is well-developed.  HENT:     Head: Normocephalic.     Right Ear: External ear normal.     Left Ear: External ear normal.     Nose: Nose normal.  Eyes:     General:        Right eye: No discharge.        Left eye: No discharge.     Conjunctiva/sclera: Conjunctivae normal.     Pupils: Pupils are equal, round, and reactive to light.  Neck:     Thyroid: No thyromegaly.     Vascular: No JVD.     Trachea: No tracheal deviation.  Cardiovascular:     Rate and Rhythm: Normal rate and regular rhythm.     Heart sounds: Normal heart sounds.  Pulmonary:     Effort: No respiratory distress.     Breath sounds: No stridor. No wheezing.  Abdominal:     General: Bowel sounds are normal. There is no distension.     Palpations: Abdomen is soft. There is no mass.     Tenderness: There is no abdominal tenderness. There is no guarding or rebound.  Musculoskeletal:        General: No tenderness.     Cervical back: Normal range of motion and neck supple. No rigidity.  Lymphadenopathy:     Cervical: No cervical adenopathy.  Skin:    Findings: No erythema or rash.  Neurological:     Cranial Nerves: No cranial nerve deficit.     Motor: No abnormal muscle tone.     Coordination: Coordination normal.     Deep Tendon Reflexes: Reflexes normal.  Psychiatric:        Behavior: Behavior normal.        Thought Content: Thought content normal.        Judgment: Judgment normal.     Lab Results  Component Value Date   WBC 7.0 02/18/2023   HGB 11.0 (L) 02/18/2023   HCT 33.3 (L) 02/18/2023   PLT 381.0 02/18/2023   GLUCOSE 91 02/18/2023   CHOL 145 02/18/2023   TRIG 125.0 02/18/2023   HDL 63.60 02/18/2023   LDLDIRECT 89.1 06/29/2013   LDLCALC 57 02/18/2023   ALT 12 02/07/2023   AST 22 02/07/2023   NA 137 02/18/2023   K 4.3 02/18/2023   CL 103 02/18/2023   CREATININE 1.07  02/18/2023   BUN 20 02/18/2023   CO2 25 02/18/2023   TSH 1.685 02/07/2023   INR 1.2 02/07/2023   HGBA1C 5.5 02/08/2023    CT HEAD WO CONTRAST ( )  Result Date:  02/07/2023 CLINICAL DATA:  Head trauma, minor (Age >= 65y); Neck trauma (Age >= 65y) EXAM: CT HEAD WITHOUT CONTRAST CT CERVICAL SPINE WITHOUT CONTRAST TECHNIQUE: Multidetector CT imaging of the head and cervical spine was performed following the standard protocol without intravenous contrast. Multiplanar CT image reconstructions of the cervical spine were also generated. RADIATION DOSE REDUCTION: This exam was performed according to the departmental dose-optimization program which includes automated exposure control, adjustment of the mA and/or kV according to patient size and/or use of iterative reconstruction technique. COMPARISON:  MR Head 04/25/22, CT C Spine 04/24/22 FINDINGS: CT HEAD FINDINGS Brain: No evidence of acute infarction, hemorrhage, hydrocephalus, extra-axial collection or mass lesion/mass effect. There is sequela of moderate chronic microvascular ischemic change with a chronic infarct in the left parietal lobe in the left frontal lobe Vascular: No hyperdense vessel or unexpected calcification. Skull: Normal. Negative for fracture or focal lesion. Sinuses/Orbits: No middle ear or mastoid effusion. Paranasal sinuses are notable for mucosal thickening left maxillary sinus. Bilateral lens replacement. Orbits are otherwise unremarkable Other: None. CT CERVICAL SPINE FINDINGS Alignment: Normal. Skull base and vertebrae: No acute fracture. No primary bone lesion or focal pathologic process. Soft tissues and spinal canal: No prevertebral fluid or swelling. No visible canal hematoma. Hyperdense region in the left aspect of the spinal canal at the C3-C4 level (series 9, image 38) correlates with the portion of the left vertebral artery that courses through the spinal canal. Disc levels:  No evidence of high-grade spinal canal stenosis. Upper  chest: Negative. Other: None IMPRESSION: 1. No acute intracranial abnormality. 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 2. No acute fracture or traumatic subluxation of the cervical spine. 3. Redemonstrated tortuous left vertebral artery with the V2 segment coursing through the left aspect of the spinal canal. Findings were discussed with Dr. Laurell Josephs on 02/07/23 at 12:15 PM Electronically Signed   By: Lorenza Cambridge M.D.   On: 02/07/2023 12:29   CT CERVICAL SPINE WO CONTRAST  Result Date: 02/07/2023 CLINICAL DATA:  Head trauma, minor (Age >= 65y); Neck trauma (Age >= 65y) EXAM: CT HEAD WITHOUT CONTRAST CT CERVICAL SPINE WITHOUT CONTRAST TECHNIQUE: Multidetector CT imaging of the head and cervical spine was performed following the standard protocol without intravenous contrast. Multiplanar CT image reconstructions of the cervical spine were also generated. RADIATION DOSE REDUCTION: This exam was performed according to the departmental dose-optimization program which includes automated exposure control, adjustment of the mA and/or kV according to patient size and/or use of iterative reconstruction technique. COMPARISON:  MR Head 04/25/22, CT C Spine 04/24/22 FINDINGS: CT HEAD FINDINGS Brain: No evidence of acute infarction, hemorrhage, hydrocephalus, extra-axial collection or mass lesion/mass effect. There is sequela of moderate chronic microvascular ischemic change with a chronic infarct in the left parietal lobe in the left frontal lobe Vascular: No hyperdense vessel or unexpected calcification. Skull: Normal. Negative for fracture or focal lesion. Sinuses/Orbits: No middle ear or mastoid effusion. Paranasal sinuses are notable for mucosal thickening left maxillary sinus. Bilateral lens replacement. Orbits are otherwise unremarkable Other: None. CT CERVICAL SPINE FINDINGS Alignment: Normal. Skull base and vertebrae: No acute fracture. No  primary bone lesion or focal pathologic process. Soft tissues and spinal canal: No prevertebral fluid or swelling. No visible canal hematoma. Hyperdense region in the left aspect of the spinal canal at the C3-C4 level (series 9, image 38) correlates with the portion of the left vertebral artery that courses through the spinal  canal. Disc levels:  No evidence of high-grade spinal canal stenosis. Upper chest: Negative. Other: None IMPRESSION: 1. No acute intracranial abnormality. 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 2. No acute fracture or traumatic subluxation of the cervical spine. 3. Redemonstrated tortuous left vertebral artery with the V2 segment coursing through the left aspect of the spinal canal. Findings were discussed with Dr. Laurell Josephs on 02/07/23 at 12:15 PM Electronically Signed   By: Lorenza Cambridge M.D.   On: 02/07/2023 12:29   CT CHEST ABDOMEN PELVIS W CONTRAST  Result Date: 02/07/2023 CLINICAL DATA:  Fall. EXAM: CT CHEST, ABDOMEN, AND PELVIS WITH CONTRAST TECHNIQUE: Multidetector CT imaging of the chest, abdomen and pelvis was performed following the standard protocol during bolus administration of intravenous contrast. RADIATION DOSE REDUCTION: This exam was performed according to the departmental dose-optimization program which includes automated exposure control, adjustment of the mA and/or kV according to patient size and/or use of iterative reconstruction technique. CONTRAST:  Unspecified. COMPARISON:  November 19, 2021. FINDINGS: CT CHEST FINDINGS Cardiovascular: 4.4 cm ascending thoracic aortic aneurysm is noted. No dissection is noted. Normal cardiac size. No pericardial effusion. Coronary artery calcifications are noted. Mediastinum/Nodes: Thyroid gland and esophagus are unremarkable. Calcified right paratracheal adenopathy is noted consistent with prior granulomatous disease. Lungs/Pleura: No pneumothorax or pleural  effusion is noted. Minimal bilateral posterior basilar subsegmental atelectasis. 4 mm nodule noted in right middle lobe best seen on image number 96 of series 4. Musculoskeletal: Moderately displaced and comminuted proximal left humeral head and neck fracture is noted. CT ABDOMEN PELVIS FINDINGS Hepatobiliary: No focal liver abnormality is seen. No gallstones, gallbladder wall thickening, or biliary dilatation. Pancreas: Fatty replacement of pancreas is noted. Spleen: Normal in size without focal abnormality. Adrenals/Urinary Tract: Adrenal glands appear normal. No hydronephrosis or renal obstruction is noted. Urinary bladder is unremarkable. Stomach/Bowel: Stomach appears normal. Diverticulosis of descending and sigmoid colon is noted without inflammation. There is no evidence of bowel obstruction. Vascular/Lymphatic: Aortic atherosclerosis. No enlarged abdominal or pelvic lymph nodes. Reproductive: Status post hysterectomy. No adnexal masses. Other: No abdominal wall hernia or abnormality. No abdominopelvic ascites. Musculoskeletal: Old T10 compression fracture is noted. No acute osseous abnormality is noted. IMPRESSION: Moderately displaced and comminuted proximal left humeral head and neck fracture. No other definite acute traumatic injury is noted in the chest, abdomen or pelvis. 4.4 cm ascending thoracic aortic aneurysm. Recommend annual imaging followup by CTA or MRA. This recommendation follows 2010 ACCF/AHA/AATS/ACR/ASA/SCA/SCAI/SIR/STS/SVM Guidelines for the Diagnosis and Management of Patients with Thoracic Aortic Disease. Circulation. 2010; 121: Z610-R604. Aortic aneurysm NOS (ICD10-I71.9). 4 mm right middle lobe nodule. No follow-up needed if patient is low-risk.This recommendation follows the consensus statement: Guidelines for Management of Incidental Pulmonary Nodules Detected on CT Images: From the Fleischner Society 2017; Radiology 2017; 284:228-243. Diverticulosis of descending and sigmoid colon  without inflammation. Old T10 compression fracture is noted. Coronary artery calcifications are noted. Aortic Atherosclerosis (ICD10-I70.0). These results were discussed in person at the time of interpretation on 02/07/2023 at 12:10 pm to provider Violeta Gelinas. MD, who verbally acknowledged these results. Electronically Signed   By: Lupita Raider M.D.   On: 02/07/2023 12:24   DG Pelvis Portable  Result Date: 02/07/2023 CLINICAL DATA:  Fall. EXAM: PORTABLE PELVIS 1-2 VIEWS COMPARISON:  None Available. FINDINGS: There is no evidence of pelvic fracture or diastasis. No pelvic bone lesions are seen. IMPRESSION: Negative. Electronically Signed   By: Lupita Raider  M.D.   On: 02/07/2023 12:05   DG Chest Port 1 View  Result Date: 02/07/2023 CLINICAL DATA:  Fall. EXAM: PORTABLE CHEST 1 VIEW COMPARISON:  November 19, 2021. FINDINGS: Mild cardiomegaly the heart size and mediastinal contours are within normal limits. Both lungs are clear. The visualized skeletal structures are unremarkable. IMPRESSION: No active disease. Electronically Signed   By: Lupita Raider M.D.   On: 02/07/2023 12:04    Assessment & Plan:   Problem List Items Addressed This Visit     Essential hypertension    Continue with metoprolol and losartan.  Hydrate well      Low serum vitamin B12 - Primary    Will give SQ B12 today      Osteoporosis    Take Vit D3 and K2      Closed comminuted fracture of left humerus    Starting PT Try TENS unit Follow-up with Dr. Carola Frost         No orders of the defined types were placed in this encounter.     Follow-up: Return in about 3 months (around 05/31/2023) for a follow-up visit.  Sonda Primes, MD

## 2023-02-28 NOTE — Assessment & Plan Note (Addendum)
Take Vit D3 and K2

## 2023-02-28 NOTE — Assessment & Plan Note (Signed)
Will give SQ B12 today ?

## 2023-02-28 NOTE — Patient Instructions (Addendum)
TENS unit Vitamin D3 and K2

## 2023-03-01 DIAGNOSIS — I482 Chronic atrial fibrillation, unspecified: Secondary | ICD-10-CM | POA: Diagnosis not present

## 2023-03-01 DIAGNOSIS — I2510 Atherosclerotic heart disease of native coronary artery without angina pectoris: Secondary | ICD-10-CM | POA: Diagnosis not present

## 2023-03-01 DIAGNOSIS — I5032 Chronic diastolic (congestive) heart failure: Secondary | ICD-10-CM | POA: Diagnosis not present

## 2023-03-01 DIAGNOSIS — I110 Hypertensive heart disease with heart failure: Secondary | ICD-10-CM | POA: Diagnosis not present

## 2023-03-01 DIAGNOSIS — S42212D Unspecified displaced fracture of surgical neck of left humerus, subsequent encounter for fracture with routine healing: Secondary | ICD-10-CM | POA: Diagnosis not present

## 2023-03-01 DIAGNOSIS — R001 Bradycardia, unspecified: Secondary | ICD-10-CM | POA: Diagnosis not present

## 2023-03-01 DIAGNOSIS — S42292D Other displaced fracture of upper end of left humerus, subsequent encounter for fracture with routine healing: Secondary | ICD-10-CM | POA: Diagnosis not present

## 2023-03-03 ENCOUNTER — Encounter: Payer: Self-pay | Admitting: Internal Medicine

## 2023-03-03 NOTE — Assessment & Plan Note (Signed)
Continue with metoprolol and losartan.  Hydrate well 

## 2023-03-04 DIAGNOSIS — M25412 Effusion, left shoulder: Secondary | ICD-10-CM | POA: Diagnosis not present

## 2023-03-04 DIAGNOSIS — M25612 Stiffness of left shoulder, not elsewhere classified: Secondary | ICD-10-CM | POA: Diagnosis not present

## 2023-03-04 DIAGNOSIS — M25512 Pain in left shoulder: Secondary | ICD-10-CM | POA: Diagnosis not present

## 2023-03-04 DIAGNOSIS — M6281 Muscle weakness (generalized): Secondary | ICD-10-CM | POA: Diagnosis not present

## 2023-03-07 DIAGNOSIS — M25612 Stiffness of left shoulder, not elsewhere classified: Secondary | ICD-10-CM | POA: Diagnosis not present

## 2023-03-07 DIAGNOSIS — M25412 Effusion, left shoulder: Secondary | ICD-10-CM | POA: Diagnosis not present

## 2023-03-07 DIAGNOSIS — M6281 Muscle weakness (generalized): Secondary | ICD-10-CM | POA: Diagnosis not present

## 2023-03-07 DIAGNOSIS — M25512 Pain in left shoulder: Secondary | ICD-10-CM | POA: Diagnosis not present

## 2023-03-11 ENCOUNTER — Ambulatory Visit: Payer: Medicare Other | Admitting: Internal Medicine

## 2023-03-11 DIAGNOSIS — M25612 Stiffness of left shoulder, not elsewhere classified: Secondary | ICD-10-CM | POA: Diagnosis not present

## 2023-03-11 DIAGNOSIS — M25412 Effusion, left shoulder: Secondary | ICD-10-CM | POA: Diagnosis not present

## 2023-03-11 DIAGNOSIS — M25512 Pain in left shoulder: Secondary | ICD-10-CM | POA: Diagnosis not present

## 2023-03-11 DIAGNOSIS — M6281 Muscle weakness (generalized): Secondary | ICD-10-CM | POA: Diagnosis not present

## 2023-03-13 DIAGNOSIS — M25512 Pain in left shoulder: Secondary | ICD-10-CM | POA: Diagnosis not present

## 2023-03-13 DIAGNOSIS — M25412 Effusion, left shoulder: Secondary | ICD-10-CM | POA: Diagnosis not present

## 2023-03-13 DIAGNOSIS — M25612 Stiffness of left shoulder, not elsewhere classified: Secondary | ICD-10-CM | POA: Diagnosis not present

## 2023-03-13 DIAGNOSIS — S42212D Unspecified displaced fracture of surgical neck of left humerus, subsequent encounter for fracture with routine healing: Secondary | ICD-10-CM | POA: Diagnosis not present

## 2023-03-13 DIAGNOSIS — M6281 Muscle weakness (generalized): Secondary | ICD-10-CM | POA: Diagnosis not present

## 2023-03-15 ENCOUNTER — Ambulatory Visit: Payer: Medicare Other | Admitting: Physician Assistant

## 2023-03-18 DIAGNOSIS — M25612 Stiffness of left shoulder, not elsewhere classified: Secondary | ICD-10-CM | POA: Diagnosis not present

## 2023-03-18 DIAGNOSIS — M6281 Muscle weakness (generalized): Secondary | ICD-10-CM | POA: Diagnosis not present

## 2023-03-18 DIAGNOSIS — M25412 Effusion, left shoulder: Secondary | ICD-10-CM | POA: Diagnosis not present

## 2023-03-18 DIAGNOSIS — M25512 Pain in left shoulder: Secondary | ICD-10-CM | POA: Diagnosis not present

## 2023-03-21 DIAGNOSIS — M6281 Muscle weakness (generalized): Secondary | ICD-10-CM | POA: Diagnosis not present

## 2023-03-21 DIAGNOSIS — M25512 Pain in left shoulder: Secondary | ICD-10-CM | POA: Diagnosis not present

## 2023-03-21 DIAGNOSIS — M25612 Stiffness of left shoulder, not elsewhere classified: Secondary | ICD-10-CM | POA: Diagnosis not present

## 2023-03-21 DIAGNOSIS — M25412 Effusion, left shoulder: Secondary | ICD-10-CM | POA: Diagnosis not present

## 2023-03-25 DIAGNOSIS — M25612 Stiffness of left shoulder, not elsewhere classified: Secondary | ICD-10-CM | POA: Diagnosis not present

## 2023-03-25 DIAGNOSIS — M6281 Muscle weakness (generalized): Secondary | ICD-10-CM | POA: Diagnosis not present

## 2023-03-25 DIAGNOSIS — M25512 Pain in left shoulder: Secondary | ICD-10-CM | POA: Diagnosis not present

## 2023-03-25 DIAGNOSIS — M25412 Effusion, left shoulder: Secondary | ICD-10-CM | POA: Diagnosis not present

## 2023-03-28 DIAGNOSIS — M25512 Pain in left shoulder: Secondary | ICD-10-CM | POA: Diagnosis not present

## 2023-03-28 DIAGNOSIS — M6281 Muscle weakness (generalized): Secondary | ICD-10-CM | POA: Diagnosis not present

## 2023-03-28 DIAGNOSIS — M25412 Effusion, left shoulder: Secondary | ICD-10-CM | POA: Diagnosis not present

## 2023-03-28 DIAGNOSIS — M25612 Stiffness of left shoulder, not elsewhere classified: Secondary | ICD-10-CM | POA: Diagnosis not present

## 2023-04-02 DIAGNOSIS — M25512 Pain in left shoulder: Secondary | ICD-10-CM | POA: Diagnosis not present

## 2023-04-02 DIAGNOSIS — M25412 Effusion, left shoulder: Secondary | ICD-10-CM | POA: Diagnosis not present

## 2023-04-02 DIAGNOSIS — M25612 Stiffness of left shoulder, not elsewhere classified: Secondary | ICD-10-CM | POA: Diagnosis not present

## 2023-04-02 DIAGNOSIS — M6281 Muscle weakness (generalized): Secondary | ICD-10-CM | POA: Diagnosis not present

## 2023-04-04 DIAGNOSIS — M6281 Muscle weakness (generalized): Secondary | ICD-10-CM | POA: Diagnosis not present

## 2023-04-04 DIAGNOSIS — M25412 Effusion, left shoulder: Secondary | ICD-10-CM | POA: Diagnosis not present

## 2023-04-04 DIAGNOSIS — M25512 Pain in left shoulder: Secondary | ICD-10-CM | POA: Diagnosis not present

## 2023-04-04 DIAGNOSIS — M25612 Stiffness of left shoulder, not elsewhere classified: Secondary | ICD-10-CM | POA: Diagnosis not present

## 2023-04-09 DIAGNOSIS — M25612 Stiffness of left shoulder, not elsewhere classified: Secondary | ICD-10-CM | POA: Diagnosis not present

## 2023-04-09 DIAGNOSIS — M25412 Effusion, left shoulder: Secondary | ICD-10-CM | POA: Diagnosis not present

## 2023-04-09 DIAGNOSIS — M25512 Pain in left shoulder: Secondary | ICD-10-CM | POA: Diagnosis not present

## 2023-04-09 DIAGNOSIS — M6281 Muscle weakness (generalized): Secondary | ICD-10-CM | POA: Diagnosis not present

## 2023-04-12 DIAGNOSIS — M25512 Pain in left shoulder: Secondary | ICD-10-CM | POA: Diagnosis not present

## 2023-04-12 DIAGNOSIS — M25412 Effusion, left shoulder: Secondary | ICD-10-CM | POA: Diagnosis not present

## 2023-04-12 DIAGNOSIS — M25612 Stiffness of left shoulder, not elsewhere classified: Secondary | ICD-10-CM | POA: Diagnosis not present

## 2023-04-12 DIAGNOSIS — M6281 Muscle weakness (generalized): Secondary | ICD-10-CM | POA: Diagnosis not present

## 2023-04-24 ENCOUNTER — Ambulatory Visit: Payer: Medicare Other | Admitting: Sports Medicine

## 2023-04-24 VITALS — BP 124/84 | Ht 64.0 in | Wt 140.0 lb

## 2023-04-24 DIAGNOSIS — M7631 Iliotibial band syndrome, right leg: Secondary | ICD-10-CM | POA: Diagnosis not present

## 2023-04-24 DIAGNOSIS — M25551 Pain in right hip: Secondary | ICD-10-CM

## 2023-04-24 DIAGNOSIS — S42212D Unspecified displaced fracture of surgical neck of left humerus, subsequent encounter for fracture with routine healing: Secondary | ICD-10-CM | POA: Diagnosis not present

## 2023-04-24 MED ORDER — METHYLPREDNISOLONE ACETATE 40 MG/ML IJ SUSP
40.0000 mg | Freq: Once | INTRAMUSCULAR | Status: AC
  Administered 2023-04-24: 40 mg via INTRA_ARTICULAR

## 2023-04-24 NOTE — Assessment & Plan Note (Signed)
Procedure:  Injection of Consent obtained and verified. Time-out conducted. Noted no overlying erythema, induration, or other signs of local infection. Skin prepped in a sterile fashion. Topical analgesic spray: Ethyl chloride. Completed without difficulty. Injection into posterior facet of RT Greater trochanter Meds: 1 cc soumedrol 40 and 4 cc lidocaine 1% Advised to call if fevers/chills, erythema, induration, drainage, or persistent bleeding.  I suggested we go easy on the activity for 2 days Then start some easy hip abduction exercises and isometrics Some straight leg raises and isometrics for her quads bilaterally  Recheck if the pain is not resolving

## 2023-04-24 NOTE — Progress Notes (Signed)
Chief complaint right hip pain  Patient is recovering from a shoulder fracture from a fall that is being treated by Dr. Carola Frost She is getting physical therapy for her left shoulder During this time she has been less active and started having some right lateral hip pain That pain radiates down to her knee along her iliotibial band She has had this multiple times in the past and has known hip abduction weakness She has had difficulty regaining strength on that side  She thinks it may have changed her gait because she is having some anterior knee pain just under her patella on the left No swelling or giving way of that knee  Physical exam Elderly white female in no acute distress BP 124/84   Ht 5\' 4"  (1.626 m)   Wt 140 lb (63.5 kg)   BMI 24.03 kg/m   Examination of the right hip shows a full range of motion Good strength on hip flexion Weakness on hip abduction Better strength on hip rotation Point tenderness over the posterior facet of the greater trochanter  Left knee reveals no swelling and good flexion and extension No pain noted on examination today

## 2023-04-25 ENCOUNTER — Encounter (INDEPENDENT_AMBULATORY_CARE_PROVIDER_SITE_OTHER): Payer: Self-pay

## 2023-04-25 DIAGNOSIS — M6281 Muscle weakness (generalized): Secondary | ICD-10-CM | POA: Diagnosis not present

## 2023-04-25 DIAGNOSIS — M25412 Effusion, left shoulder: Secondary | ICD-10-CM | POA: Diagnosis not present

## 2023-04-25 DIAGNOSIS — M25512 Pain in left shoulder: Secondary | ICD-10-CM | POA: Diagnosis not present

## 2023-04-25 DIAGNOSIS — M25612 Stiffness of left shoulder, not elsewhere classified: Secondary | ICD-10-CM | POA: Diagnosis not present

## 2023-04-29 DIAGNOSIS — M6281 Muscle weakness (generalized): Secondary | ICD-10-CM | POA: Diagnosis not present

## 2023-04-29 DIAGNOSIS — M25412 Effusion, left shoulder: Secondary | ICD-10-CM | POA: Diagnosis not present

## 2023-04-29 DIAGNOSIS — M25612 Stiffness of left shoulder, not elsewhere classified: Secondary | ICD-10-CM | POA: Diagnosis not present

## 2023-04-29 DIAGNOSIS — M25512 Pain in left shoulder: Secondary | ICD-10-CM | POA: Diagnosis not present

## 2023-05-02 DIAGNOSIS — M25512 Pain in left shoulder: Secondary | ICD-10-CM | POA: Diagnosis not present

## 2023-05-02 DIAGNOSIS — M6281 Muscle weakness (generalized): Secondary | ICD-10-CM | POA: Diagnosis not present

## 2023-05-02 DIAGNOSIS — M25412 Effusion, left shoulder: Secondary | ICD-10-CM | POA: Diagnosis not present

## 2023-05-02 DIAGNOSIS — M25612 Stiffness of left shoulder, not elsewhere classified: Secondary | ICD-10-CM | POA: Diagnosis not present

## 2023-05-04 ENCOUNTER — Other Ambulatory Visit: Payer: Self-pay | Admitting: Internal Medicine

## 2023-05-05 ENCOUNTER — Encounter: Payer: Self-pay | Admitting: Internal Medicine

## 2023-05-06 ENCOUNTER — Other Ambulatory Visit: Payer: Self-pay

## 2023-05-06 DIAGNOSIS — M6281 Muscle weakness (generalized): Secondary | ICD-10-CM | POA: Diagnosis not present

## 2023-05-06 DIAGNOSIS — M25612 Stiffness of left shoulder, not elsewhere classified: Secondary | ICD-10-CM | POA: Diagnosis not present

## 2023-05-06 DIAGNOSIS — M25412 Effusion, left shoulder: Secondary | ICD-10-CM | POA: Diagnosis not present

## 2023-05-06 DIAGNOSIS — R1030 Lower abdominal pain, unspecified: Secondary | ICD-10-CM

## 2023-05-06 DIAGNOSIS — K589 Irritable bowel syndrome without diarrhea: Secondary | ICD-10-CM

## 2023-05-06 DIAGNOSIS — M25512 Pain in left shoulder: Secondary | ICD-10-CM | POA: Diagnosis not present

## 2023-05-10 ENCOUNTER — Encounter: Payer: Self-pay | Admitting: Internal Medicine

## 2023-05-10 DIAGNOSIS — M25512 Pain in left shoulder: Secondary | ICD-10-CM | POA: Diagnosis not present

## 2023-05-10 DIAGNOSIS — M6281 Muscle weakness (generalized): Secondary | ICD-10-CM | POA: Diagnosis not present

## 2023-05-10 DIAGNOSIS — M25412 Effusion, left shoulder: Secondary | ICD-10-CM | POA: Diagnosis not present

## 2023-05-10 DIAGNOSIS — M25612 Stiffness of left shoulder, not elsewhere classified: Secondary | ICD-10-CM | POA: Diagnosis not present

## 2023-05-14 ENCOUNTER — Encounter: Payer: Self-pay | Admitting: Internal Medicine

## 2023-05-14 DIAGNOSIS — M6281 Muscle weakness (generalized): Secondary | ICD-10-CM | POA: Diagnosis not present

## 2023-05-14 DIAGNOSIS — M25512 Pain in left shoulder: Secondary | ICD-10-CM | POA: Diagnosis not present

## 2023-05-14 DIAGNOSIS — M25612 Stiffness of left shoulder, not elsewhere classified: Secondary | ICD-10-CM | POA: Diagnosis not present

## 2023-05-14 DIAGNOSIS — M25412 Effusion, left shoulder: Secondary | ICD-10-CM | POA: Diagnosis not present

## 2023-05-16 ENCOUNTER — Ambulatory Visit (HOSPITAL_BASED_OUTPATIENT_CLINIC_OR_DEPARTMENT_OTHER)
Admission: RE | Admit: 2023-05-16 | Discharge: 2023-05-16 | Disposition: A | Discharge status: Home / Self Care | Payer: Medicare Other | Source: Ambulatory Visit | Attending: Internal Medicine | Admitting: Internal Medicine

## 2023-05-16 DIAGNOSIS — M25612 Stiffness of left shoulder, not elsewhere classified: Secondary | ICD-10-CM | POA: Diagnosis not present

## 2023-05-16 DIAGNOSIS — K5730 Diverticulosis of large intestine without perforation or abscess without bleeding: Secondary | ICD-10-CM | POA: Diagnosis not present

## 2023-05-16 DIAGNOSIS — R109 Unspecified abdominal pain: Secondary | ICD-10-CM | POA: Diagnosis not present

## 2023-05-16 DIAGNOSIS — R1030 Lower abdominal pain, unspecified: Secondary | ICD-10-CM | POA: Insufficient documentation

## 2023-05-16 DIAGNOSIS — M25512 Pain in left shoulder: Secondary | ICD-10-CM | POA: Diagnosis not present

## 2023-05-16 DIAGNOSIS — M6281 Muscle weakness (generalized): Secondary | ICD-10-CM | POA: Diagnosis not present

## 2023-05-16 DIAGNOSIS — K589 Irritable bowel syndrome without diarrhea: Secondary | ICD-10-CM | POA: Insufficient documentation

## 2023-05-16 DIAGNOSIS — M25412 Effusion, left shoulder: Secondary | ICD-10-CM | POA: Diagnosis not present

## 2023-05-16 LAB — POCT I-STAT CREATININE: Creatinine, Ser: 1.2 mg/dL — ABNORMAL HIGH (ref 0.44–1.00)

## 2023-05-16 MED ORDER — IOHEXOL 300 MG/ML  SOLN
100.0000 mL | Freq: Once | INTRAMUSCULAR | Status: AC | PRN
  Administered 2023-05-16: 85 mL via INTRAVENOUS

## 2023-05-17 ENCOUNTER — Telehealth: Payer: Self-pay | Admitting: Internal Medicine

## 2023-05-17 NOTE — Progress Notes (Deleted)
88 y.o. G82P2000 Married Caucasian female here for annual exam.    PCP:     No LMP recorded. Patient has had a hysterectomy.           Sexually active: {yes no:314532}  The current method of family planning is status post hysterectomy.    Exercising: {yes no:314532}  {types:19826} Smoker:  no  Health Maintenance: Pap: many years ago History of abnormal Pap:  {YES NO:22349} MMG:  02/08/22 Breast Density Cat C, BI-RADS CAT 1 neg Colonoscopy:  01/17/12 BMD:   11/08/20  Result  osteopenia TDaP:  12/22/17 Gardasil:   no HIV: n/a Hep C: n/a Screening Labs:  Hb today: ***, Urine today: ***   reports that she has never smoked. She has never used smokeless tobacco. She reports that she does not drink alcohol and does not use drugs.  Past Medical History:  Diagnosis Date   Acute blood loss anemia 12/25/2017   Allergic rhinitis    Anxiety    Arthritis    "my whole spine" (07/01/2017)   Arthritis    Atrial fibrillation (HCC)    Bowel obstruction (HCC)    in [place]   Bradycardia with 41-50 beats per minute 12/27/2017   Cancer (HCC)    Cellulitis of leg, right with large prepatella hematoma and open wounds 12/26/2017   CKD (chronic kidney disease) stage 2, GFR 60-89 ml/min    Colon polyps    Coronary artery disease    10/18 PCI/DES to p/m LCx with cutting balloon to mLcx   Diverticulosis of colon    GERD (gastroesophageal reflux disease)    Hip bursitis 2010   Dr Wyline Mood, Post op seroma   History of colon polyps    HSV (herpes simplex virus) anogenital infection 07/2019   HTN (hypertension)    IBS (irritable bowel syndrome)    constipation predominant - Dr Kinnie Scales   Lichen sclerosus    Osteopenia 11/2016   T score -2.0 FRAX 15%/4.3%   PAC (premature atrial contraction)    Symptomatiic   Renal insufficiency    Right bundle branch block (RBBB) on electrocardiogram (ECG) 12/27/2017   Scoliosis    SVT (supraventricular tachycardia)    brief history   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   biopsy showing vulvar atypia, possible VIN I    Past Surgical History:  Procedure Laterality Date   ANTERIOR AND POSTERIOR VAGINAL REPAIR  01/2002   Hattie Perch 01/23/2011   APPENDECTOMY  1948   CARDIAC CATHETERIZATION  06/26/2017   CORONARY ANGIOPLASTY WITH STENT PLACEMENT  07/01/2017   CORONARY ATHERECTOMY N/A 07/01/2017   Procedure: CORONARY ATHERECTOMY;  Surgeon: Lyn Records, MD;  Location: [facility];  Service: Cardiovascular;  Laterality: N/A;   CORONARY STENT INTERVENTION N/A 07/01/2017   Procedure: CORONARY STENT INTERVENTION;  Surgeon: Lyn Records, MD;  Location: [facility];  Service: Cardiovascular;  Laterality: N/A;   HAMMER TOE SURGERY     HEMORRHOID BANDING     HIP SURGERY Left 04/2009   hip examination under anesthesia followed by greater trochanteric bursectomy; iliotibial band tenotomy/notes 01/20/2011   I & D EXTREMITY Right 01/10/2018   Procedure: IRRIGATION AND DEBRIDEMENT RIGHT KNEE, APPLY WOUND VAC;  Surgeon: Nadara Mustard, MD;  Location: [facility] OR;  Service: Orthopedics;  Laterality: Right;   KNEE BURSECTOMY Right 04/2009   Hattie Perch 01/09/2011   LEFT ATRIAL APPENDAGE OCCLUSION N/A 08/24/2021   Procedure: LEFT ATRIAL APPENDAGE OCCLUSION;  Surgeon: Tonny Bollman, MD;  Location: [facility];  Service: Cardiovascular;  Laterality: N/A;   LEFT HEART CATH AND CORONARY ANGIOGRAPHY N/A 06/26/2017   Procedure: LEFT HEART CATH AND CORONARY ANGIOGRAPHY;  Surgeon: Lyn Records, MD;  Location: [facility];  Service: Cardiovascular;  Laterality: N/A;   PUBOVAGINAL SLING  01/2002   Hattie Perch 01/23/2011   REDUCTION MAMMAPLASTY     TEE WITHOUT CARDIOVERSION N/A 08/24/2021   Procedure: TRANSESOPHAGEAL ECHOCARDIOGRAM (TEE);  Surgeon: Tonny Bollman, MD;  Location: [facility] CV [facility];  Service: Cardiovascular;  Laterality: N/A;   TEMPORARY PACEMAKER N/A 07/01/2017   Procedure: TEMPORARY PACEMAKER;  Surgeon: Lyn Records, MD;  Location:  [facility];  Service: Cardiovascular;  Laterality: N/A;   VAGINAL HYSTERECTOMY  01/2002   Vaginal hysterectomy, bilateral salpingo-oophorectomy/notes 01/23/2011    Current Outpatient Medications  Medication Sig Dispense Refill   acetaminophen (TYLENOL) 500 MG tablet Take 1,000 mg by mouth every 6 (six) hours as needed (body aches / sore hip). Maximum of 6 tablets daily (3000mg )     apixaban (ELIQUIS) 2.5 MG TABS tablet Take 2.5 mg by mouth 2 (two) times daily.     BIOTIN PO Take 5,000 mcg by mouth in the morning.     diclofenac Sodium (VOLTAREN) 1 % GEL Apply 2 g topically 4 (four) times daily as needed (back pain.).     dicyclomine (BENTYL) 20 MG tablet Take 1 by mouth every 4-6 hours as needed for cramping 30 tablet 3   empagliflozin (JARDIANCE) 10 MG TABS tablet Take 1 tablet (10 mg total) by mouth daily before breakfast. 30 tablet 9   escitalopram (LEXAPRO) 5 MG tablet Take 5 mg by mouth every evening.     esomeprazole (NEXIUM) 40 MG capsule Take 40 mg by mouth daily at 12 noon.     GABAPENTIN EX Apply 1 Application topically 3 (three) times daily as needed (To vulva). Medication: Gabapentin 6% cream     Homeopathic Products (ARNICARE) GEL Apply 1 application  topically daily as needed (skin bruising).     hyoscyamine (LEVSIN) 0.125 MG tablet TAKE 1-2 TABLETS EVERY 4 HOURS AS NEEDED FOR UP TO 10 DAYS FOR CRAMPING. 100 tablet 3   LORazepam (ATIVAN) 1 MG tablet Take 2 mg by mouth at bedtime.     meclizine (ANTIVERT) 12.5 MG tablet Take 1 tablet (12.5 mg total) by mouth 3 (three) times daily as needed for dizziness. 60 tablet 1   methocarbamol (ROBAXIN) 500 MG tablet Take 1 tablet (500 mg total) by mouth every 8 (eight) hours as needed for muscle spasms. 30 tablet 0   nitroGLYCERIN (NITROSTAT) 0.4 MG SL tablet Place 1 tablet (0.4 mg total) under the tongue every 5 (five) minutes as needed for chest pain (Call 911 at 3rd dose within 15 minutes.). 20 tablet 3   ondansetron (ZOFRAN-ODT) 8  MG disintegrating tablet Take 1 tablet (8 mg total) by mouth every 8 (eight) hours as needed for nausea or vomiting. 12 tablet 3   Polyethyl Glycol-Propyl Glycol (LUBRICANT EYE DROPS) 0.4-0.3 % SOLN Place 1-2 drops into both eyes at bedtime.     polyethylene glycol (MIRALAX / GLYCOLAX) 17 g packet Take 17 g by mouth as needed for mild constipation or moderate constipation.     sodium chloride (OCEAN) 0.65 % SOLN nasal spray Place 1 spray into both nostrils at bedtime as needed for congestion.     No current facility-administered medications for this visit.    Family History  Problem Relation Age  of Onset   Colon cancer Mother        Dx age 56, died at age 77   Diabetes Father    Prostate cancer Father    Prostate cancer Brother    Pancreatic cancer Brother    Stomach cancer Son     Review of Systems  Exam:   There were no vitals taken for this visit.    General appearance: alert, cooperative and appears stated age Head: normocephalic, without obvious abnormality, atraumatic Neck: no adenopathy, supple, symmetrical, trachea midline and thyroid normal to inspection and palpation Lungs: clear to auscultation bilaterally Breasts: normal appearance, no masses or tenderness, No nipple retraction or dimpling, No nipple discharge or bleeding, No axillary adenopathy Heart: regular rate and rhythm Abdomen: soft, non-tender; no masses, no organomegaly Extremities: extremities normal, atraumatic, no cyanosis or edema Skin: skin color, texture, turgor normal. No rashes or lesions Lymph nodes: cervical, supraclavicular, and axillary nodes normal. Neurologic: grossly normal  Pelvic: External genitalia:  no lesions              No abnormal inguinal nodes palpated.              Urethra:  normal appearing urethra with no masses, tenderness or lesions              Bartholins and Skenes: normal                 Vagina: normal appearing vagina with normal color and discharge, no lesions               Cervix: no lesions              Pap taken: {yes no:314532} Bimanual Exam:  Uterus:  normal size, contour, position, consistency, mobility, non-tender              Adnexa: no mass, fullness, tenderness              Rectal exam: {yes no:314532}.  Confirms.              Anus:  normal sphincter tone, no lesions  Chaperone was present for exam:  ***  Assessment:   Well woman visit with gynecologic exam.   Plan: Mammogram screening discussed. Self breast awareness reviewed. Pap and HR HPV as above. Guidelines for Calcium, Vitamin D, regular exercise program including cardiovascular and weight bearing exercise.   Follow up annually and prn.   Additional counseling given.  {yes [id number]. _______ minutes face to face time of which over 50% was spent in counseling.    After visit summary provided.

## 2023-05-17 NOTE — Telephone Encounter (Signed)
MyChart message sent to patient. PCP prescribed Levsin on 05/06/23, CT report pending.

## 2023-05-17 NOTE — Telephone Encounter (Signed)
Received MyChart message from patient.  She is a patient of Dr. Lamar Sprinkles; however, she has a scheduled appoinitment with Hyacinth Meeker on 05/27/23 for cramping, which she states has been going on for over 2 1/2 weeks.  She wants to know what she can from now until then.  Please call and advise.  Thank you.

## 2023-05-20 ENCOUNTER — Ambulatory Visit (INDEPENDENT_AMBULATORY_CARE_PROVIDER_SITE_OTHER): Payer: Medicare Other | Admitting: Internal Medicine

## 2023-05-20 ENCOUNTER — Encounter: Payer: Self-pay | Admitting: Internal Medicine

## 2023-05-20 VITALS — BP 110/70 | HR 45 | Temp 98.6°F | Ht 64.0 in | Wt 140.0 lb

## 2023-05-20 DIAGNOSIS — M25612 Stiffness of left shoulder, not elsewhere classified: Secondary | ICD-10-CM | POA: Diagnosis not present

## 2023-05-20 DIAGNOSIS — Z85828 Personal history of other malignant neoplasm of skin: Secondary | ICD-10-CM | POA: Insufficient documentation

## 2023-05-20 DIAGNOSIS — I10 Essential (primary) hypertension: Secondary | ICD-10-CM | POA: Diagnosis not present

## 2023-05-20 DIAGNOSIS — M6281 Muscle weakness (generalized): Secondary | ICD-10-CM | POA: Diagnosis not present

## 2023-05-20 DIAGNOSIS — M25412 Effusion, left shoulder: Secondary | ICD-10-CM | POA: Diagnosis not present

## 2023-05-20 DIAGNOSIS — I4821 Permanent atrial fibrillation: Secondary | ICD-10-CM

## 2023-05-20 DIAGNOSIS — R109 Unspecified abdominal pain: Secondary | ICD-10-CM | POA: Diagnosis not present

## 2023-05-20 DIAGNOSIS — M25512 Pain in left shoulder: Secondary | ICD-10-CM | POA: Diagnosis not present

## 2023-05-20 DIAGNOSIS — L821 Other seborrheic keratosis: Secondary | ICD-10-CM | POA: Insufficient documentation

## 2023-05-20 DIAGNOSIS — K580 Irritable bowel syndrome with diarrhea: Secondary | ICD-10-CM | POA: Diagnosis not present

## 2023-05-20 DIAGNOSIS — C44722 Squamous cell carcinoma of skin of right lower limb, including hip: Secondary | ICD-10-CM | POA: Insufficient documentation

## 2023-05-20 DIAGNOSIS — D225 Melanocytic nevi of trunk: Secondary | ICD-10-CM | POA: Insufficient documentation

## 2023-05-20 DIAGNOSIS — I25119 Atherosclerotic heart disease of native coronary artery with unspecified angina pectoris: Secondary | ICD-10-CM

## 2023-05-20 MED ORDER — ESCITALOPRAM OXALATE 5 MG PO TABS: 5.0000 mg | ORAL_TABLET | Freq: Every evening | ORAL | 5 refills | Status: DC

## 2023-05-20 NOTE — Progress Notes (Signed)
Subjective:  Patient ID: Hailey [name], female    DOB: Sep 12, 1934  Age: 88 y.o. MRN: [id number]  CC: Follow-up (3 mnth f/u, CHECK IF CT results are back, lt shoulder swelling)   HPI Hailey [name] presents for abd pain x 3 weeks - resolved by and large... Better now... Pt had a CT of the abd on 05/16/2023  Outpatient Medications Prior to Visit  Medication Sig Dispense Refill   acetaminophen (TYLENOL) 500 MG tablet Take 1,000 mg by mouth every 6 (six) hours as needed (body aches / sore hip). Maximum of 6 tablets daily (3000mg )     apixaban (ELIQUIS) 2.5 MG TABS tablet Take 2.5 mg by mouth 2 (two) times daily.     BIOTIN PO Take 5,000 mcg by mouth in the morning.     diclofenac Sodium (VOLTAREN) 1 % GEL Apply 2 g topically 4 (four) times daily as needed (back pain.).     dicyclomine (BENTYL) 20 MG tablet Take 1 by mouth every 4-6 hours as needed for cramping 30 tablet 3   empagliflozin (JARDIANCE) 10 MG TABS tablet Take 1 tablet (10 mg total) by mouth daily before breakfast. 30 tablet 9   esomeprazole (NEXIUM) 40 MG capsule Take 40 mg by mouth daily at 12 noon.     GABAPENTIN EX Apply 1 Application topically 3 (three) times daily as needed (To vulva). Medication: Gabapentin 6% cream     Homeopathic Products (ARNICARE) GEL Apply 1 application  topically daily as needed (skin bruising).     hyoscyamine (LEVSIN) 0.125 MG tablet TAKE 1-2 TABLETS EVERY 4 HOURS AS NEEDED FOR UP TO 10 DAYS FOR CRAMPING. 100 tablet 3   LORazepam (ATIVAN) 1 MG tablet Take 2 mg by mouth at bedtime.     meclizine (ANTIVERT) 12.5 MG tablet Take 1 tablet (12.5 mg total) by mouth 3 (three) times daily as needed for dizziness. 60 tablet 1   methocarbamol (ROBAXIN) 500 MG tablet Take 1 tablet (500 mg total) by mouth every 8 (eight) hours as needed for muscle spasms. 30 tablet 0   nitroGLYCERIN (NITROSTAT) 0.4 MG SL tablet Place 1 tablet (0.4 mg total) under the tongue every 5 (five) minutes as needed for chest pain (Call  911 at 3rd dose within 15 minutes.). 20 tablet 3   ondansetron (ZOFRAN-ODT) 8 MG disintegrating tablet Take 1 tablet (8 mg total) by mouth every 8 (eight) hours as needed for nausea or vomiting. 12 tablet 3   Polyethyl Glycol-Propyl Glycol (LUBRICANT EYE DROPS) 0.4-0.3 % SOLN Place 1-2 drops into both eyes at bedtime.     polyethylene glycol (MIRALAX / GLYCOLAX) 17 g packet Take 17 g by mouth as needed for mild constipation or moderate constipation.     sodium chloride (OCEAN) 0.65 % SOLN nasal spray Place 1 spray into both nostrils at bedtime as needed for congestion.     escitalopram (LEXAPRO) 5 MG tablet Take 5 mg by mouth every evening.     No facility-administered medications prior to visit.    ROS: Review of Systems  Constitutional:  Negative for activity change, appetite change, chills, fatigue and unexpected weight change.  HENT:  Negative for congestion, mouth sores and sinus pressure.   Eyes:  Negative for visual disturbance.  Respiratory:  Positive for shortness of breath. Negative for cough and chest tightness.   Gastrointestinal:  Positive for abdominal pain and diarrhea. Negative for nausea.  Genitourinary:  Negative for difficulty urinating, frequency and vaginal pain.  Musculoskeletal:  Negative  for back pain and gait problem.  Skin:  Negative for pallor and rash.  Neurological:  Negative for dizziness, tremors, weakness, numbness and headaches.  Psychiatric/Behavioral:  Negative for confusion, sleep disturbance and suicidal ideas.     Objective:  BP 110/70 (BP Location: Left Arm, Patient Position: Sitting, Cuff Size: Normal)   Pulse (!) 45   Temp 98.6 F (37 C) (Oral)   Ht 5\' 4"  (1.626 m)   Wt 140 lb (63.5 kg)   SpO2 98%   BMI 24.03 kg/m   BP Readings from Last 3 Encounters:  05/20/23 110/70  04/24/23 124/84  02/28/23 124/84    Wt Readings from Last 3 Encounters:  05/20/23 140 lb (63.5 kg)  04/24/23 140 lb (63.5 kg)  02/28/23 144 lb (65.3 kg)     Physical Exam Constitutional:      General: She is not in acute distress.    Appearance: She is well-developed. She is obese.  HENT:     Head: Normocephalic.     Right Ear: External ear normal.     Left Ear: External ear normal.     Nose: Nose normal.  Eyes:     General:        Right eye: No discharge.        Left eye: No discharge.     Conjunctiva/sclera: Conjunctivae normal.     Pupils: Pupils are equal, round, and reactive to light.  Neck:     Thyroid: No thyromegaly.     Vascular: No JVD.     Trachea: No tracheal deviation.  Cardiovascular:     Rate and Rhythm: Normal rate and regular rhythm.     Heart sounds: Normal heart sounds.  Pulmonary:     Effort: No respiratory distress.     Breath sounds: No stridor. No wheezing.  Abdominal:     General: Bowel sounds are normal. There is no distension.     Palpations: Abdomen is soft. There is no mass.     Tenderness: There is no abdominal tenderness. There is no guarding or rebound.  Musculoskeletal:        General: No tenderness.     Cervical back: Normal range of motion and neck supple. No rigidity.  Lymphadenopathy:     Cervical: No cervical adenopathy.  Skin:    Findings: No erythema or rash.  Neurological:     Cranial Nerves: No cranial nerve deficit.     Motor: No abnormal muscle tone.     Coordination: Coordination normal.     Deep Tendon Reflexes: Reflexes normal.  Psychiatric:        Behavior: Behavior normal.        Thought Content: Thought content normal.        Judgment: Judgment normal.   L lung tip is protruding in the supraclavicular area LLQ - sensitive  Lab Results  Component Value Date   WBC 7.0 02/18/2023   HGB 11.0 (L) 02/18/2023   HCT 33.3 (L) 02/18/2023   PLT 381.0 02/18/2023   GLUCOSE 91 02/18/2023   CHOL 145 02/18/2023   TRIG 125.0 02/18/2023   HDL 63.60 02/18/2023   LDLDIRECT 89.1 06/29/2013   LDLCALC 57 02/18/2023   ALT 12 02/07/2023   AST 22 02/07/2023   NA 137 02/18/2023    K 4.3 02/18/2023   CL 103 02/18/2023   CREATININE 1.20 (H) 05/16/2023   BUN 20 02/18/2023   CO2 25 02/18/2023   TSH 1.685 02/07/2023   INR 1.2 02/07/2023  HGBA1C 5.5 02/08/2023    No results found.  Assessment & Plan:   Problem List Items Addressed This Visit     Essential hypertension - Primary    Continue with metoprolol and losartan.  Hydrate well      IBS    Abd CT report is pending      Permanent atrial fibrillation (HCC)    Low dose metoprolol, Eliquis      Abdominal cramps    Follow-up with Dr. Marina Goodell CT report is pending         Meds ordered this encounter  Medications   escitalopram (LEXAPRO) 5 MG tablet    Sig: Take 1 tablet (5 mg total) by mouth every evening.    Dispense:  30 tablet    Refill:  5      Follow-up: Return in about 3 months (around 08/19/2023) for a follow-up visit.  Sonda Primes, MD

## 2023-05-20 NOTE — Assessment & Plan Note (Signed)
Low dose metoprolol, Eliquis ?

## 2023-05-20 NOTE — Assessment & Plan Note (Signed)
Follow-up with Dr. Marina Goodell CT report is pending

## 2023-05-20 NOTE — Assessment & Plan Note (Signed)
Abd CT report is pending

## 2023-05-20 NOTE — Assessment & Plan Note (Signed)
Pt stopped Lipitor due to abd pain and weakness - resolved Other options discussed

## 2023-05-20 NOTE — Telephone Encounter (Signed)
Pt will be seen today by PCP.

## 2023-05-20 NOTE — Assessment & Plan Note (Signed)
Continue with metoprolol and losartan.  Hydrate well 

## 2023-05-21 ENCOUNTER — Encounter: Payer: Self-pay | Admitting: Internal Medicine

## 2023-05-23 ENCOUNTER — Encounter: Payer: Self-pay | Admitting: Internal Medicine

## 2023-05-23 DIAGNOSIS — M25412 Effusion, left shoulder: Secondary | ICD-10-CM | POA: Diagnosis not present

## 2023-05-23 DIAGNOSIS — M25512 Pain in left shoulder: Secondary | ICD-10-CM | POA: Diagnosis not present

## 2023-05-23 DIAGNOSIS — M25612 Stiffness of left shoulder, not elsewhere classified: Secondary | ICD-10-CM | POA: Diagnosis not present

## 2023-05-23 DIAGNOSIS — M6281 Muscle weakness (generalized): Secondary | ICD-10-CM | POA: Diagnosis not present

## 2023-05-23 NOTE — Telephone Encounter (Signed)
This encounter has been addressed via Ms. Rinaldo Cloud

## 2023-05-27 ENCOUNTER — Ambulatory Visit: Payer: Medicare Other | Admitting: Physician Assistant

## 2023-05-27 ENCOUNTER — Ambulatory Visit (INDEPENDENT_AMBULATORY_CARE_PROVIDER_SITE_OTHER): Payer: Medicare Other | Admitting: Obstetrics and Gynecology

## 2023-05-27 ENCOUNTER — Encounter: Payer: Medicare Other | Admitting: Obstetrics and Gynecology

## 2023-05-27 ENCOUNTER — Encounter: Payer: Self-pay | Admitting: Obstetrics and Gynecology

## 2023-05-27 ENCOUNTER — Ambulatory Visit: Payer: Medicare Other | Admitting: Obstetrics and Gynecology

## 2023-05-27 VITALS — BP 136/88 | HR 70 | Ht 62.5 in | Wt 141.0 lb

## 2023-05-27 DIAGNOSIS — M8580 Other specified disorders of bone density and structure, unspecified site: Secondary | ICD-10-CM

## 2023-05-27 DIAGNOSIS — L900 Lichen sclerosus et atrophicus: Secondary | ICD-10-CM | POA: Diagnosis not present

## 2023-05-27 DIAGNOSIS — Z9189 Other specified personal risk factors, not elsewhere classified: Secondary | ICD-10-CM

## 2023-05-27 DIAGNOSIS — Z5181 Encounter for therapeutic drug level monitoring: Secondary | ICD-10-CM | POA: Diagnosis not present

## 2023-05-27 DIAGNOSIS — Z01419 Encounter for gynecological examination (general) (routine) without abnormal findings: Secondary | ICD-10-CM

## 2023-05-27 DIAGNOSIS — B009 Herpesviral infection, unspecified: Secondary | ICD-10-CM | POA: Diagnosis not present

## 2023-05-27 DIAGNOSIS — R102 Pelvic and perineal pain: Secondary | ICD-10-CM

## 2023-05-27 MED ORDER — CLOBETASOL PROPIONATE 0.05 % EX OINT: TOPICAL_OINTMENT | CUTANEOUS | 1 refills | Status: DC

## 2023-05-27 MED ORDER — LIDOCAINE 5 % EX OINT: 1.0000 | TOPICAL_OINTMENT | Freq: Three times a day (TID) | CUTANEOUS | 1 refills | Status: DC

## 2023-05-27 NOTE — Patient Instructions (Signed)

## 2023-05-27 NOTE — Progress Notes (Unsigned)
88 y.o. G51P2000 Married Caucasian female here for 1 yr med check.    PCP:     No LMP recorded. Patient has had a hysterectomy.           Sexually active: {yes no:314532}  The current method of family planning is {contraception:315051}.    Exercising: {yes no:314532}  {types:19826} Smoker:  {YES NO:22349}  Health Maintenance: Pap:  *** History of abnormal Pap:  {YES NO:22349} MMG:  *** Colonoscopy:  *** BMD:   ***  Result  *** TDaP:  *** Gardasil:   {YES NO:22349} HIV: Hep C: Screening Labs:  Hb today: ***, Urine today: ***   reports that she has never smoked. She has never used smokeless tobacco. She reports that she does not drink alcohol and does not use drugs.  Past Medical History:  Diagnosis Date   Acute blood loss anemia 12/25/2017   Allergic rhinitis    Anxiety    Arthritis    "my whole spine" (07/01/2017)   Arthritis    Atrial fibrillation (HCC)    Bowel obstruction (HCC)    in [place]   Bradycardia with 41-50 beats per minute 12/27/2017   Cancer (HCC)    Cellulitis of leg, right with large prepatella hematoma and open wounds 12/26/2017   CKD (chronic kidney disease) stage 2, GFR 60-89 ml/min    Colon polyps    Coronary artery disease    10/18 PCI/DES to p/m LCx with cutting balloon to mLcx   Diverticulosis of colon    GERD (gastroesophageal reflux disease)    Hip bursitis 2010   Dr Wyline Mood, Post op seroma   History of colon polyps    HSV (herpes simplex virus) anogenital infection 07/2019   HTN (hypertension)    IBS (irritable bowel syndrome)    constipation predominant - Dr Kinnie Scales   Lichen sclerosus    Osteopenia 11/2016   T score -2.0 FRAX 15%/4.3%   PAC (premature atrial contraction)    Symptomatiic   Renal insufficiency    Right bundle branch block (RBBB) on electrocardiogram (ECG) 12/27/2017   Scoliosis    SVT (supraventricular tachycardia)    brief history   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   biopsy showing vulvar atypia,  possible VIN I    Past Surgical History:  Procedure Laterality Date   ANTERIOR AND POSTERIOR VAGINAL REPAIR  01/2002   Hattie Perch 01/23/2011   APPENDECTOMY  1946   CARDIAC CATHETERIZATION  06/26/2017   CORONARY ANGIOPLASTY WITH STENT PLACEMENT  07/01/2017   CORONARY ATHERECTOMY N/A 07/01/2017   Procedure: CORONARY ATHERECTOMY;  Surgeon: Lyn Records, MD;  [facility]: [facility];  Service: Cardiovascular;  Laterality: N/A;   CORONARY STENT INTERVENTION N/A 07/01/2017   Procedure: CORONARY STENT INTERVENTION;  Surgeon: Lyn Records, MD;  Location: [facility];  Service: Cardiovascular;  Laterality: N/A;   HAMMER TOE SURGERY     HEMORRHOID BANDING     HIP SURGERY Left 04/2009   hip examination under anesthesia followed by greater trochanteric bursectomy; iliotibial band tenotomy/notes 01/20/2011   I & D EXTREMITY Right 01/10/2018   Procedure: IRRIGATION AND DEBRIDEMENT RIGHT KNEE, APPLY WOUND VAC;  Surgeon: Nadara Mustard, MD;  Location: [facility] OR;  Service: Orthopedics;  Laterality: Right;   KNEE BURSECTOMY Right 04/2009   Hattie Perch 01/09/2011   LEFT ATRIAL APPENDAGE OCCLUSION N/A 08/24/2021   Procedure: LEFT ATRIAL APPENDAGE OCCLUSION;  Surgeon: Tonny Bollman, MD;  Location: [facility] INVASIVE CV LAB;  Service: Cardiovascular;  Laterality: N/A;   LEFT HEART CATH AND CORONARY ANGIOGRAPHY N/A 06/26/2017   Procedure: LEFT HEART CATH AND CORONARY ANGIOGRAPHY;  Surgeon: Lyn Records, MD;  Location: [facility];  Service: Cardiovascular;  Laterality: N/A;   PUBOVAGINAL SLING  01/2002   Hattie Perch 01/23/2011   REDUCTION MAMMAPLASTY     TEE WITHOUT CARDIOVERSION N/A 08/24/2021   Procedure: TRANSESOPHAGEAL ECHOCARDIOGRAM (TEE);  Surgeon: Tonny Bollman, MD;  Location: [facility] INVASIVE CV LAB;  Service: Cardiovascular;  Laterality: N/A;   TEMPORARY PACEMAKER N/A 07/01/2017   Procedure: TEMPORARY PACEMAKER;  Surgeon: Lyn Records, MD;  Location: [facility] INVASIVE CV LAB;  Service: Cardiovascular;   Laterality: N/A;   VAGINAL HYSTERECTOMY  01/2002   Vaginal hysterectomy, bilateral salpingo-oophorectomy/notes 01/23/2011    Current Outpatient Medications  Medication Sig Dispense Refill   acetaminophen (TYLENOL) 500 MG tablet Take 1,000 mg by mouth every 6 (six) hours as needed (body aches / sore hip). Maximum of 6 tablets daily (3000mg )     apixaban (ELIQUIS) 2.5 MG TABS tablet Take 2.5 mg by mouth 2 (two) times daily.     BIOTIN PO Take 5,000 mcg by mouth in the morning.     diclofenac Sodium (VOLTAREN) 1 % GEL Apply 2 g topically 4 (four) times daily as needed (back pain.).     dicyclomine (BENTYL) 20 MG tablet Take 1 by mouth every 4-6 hours as needed for cramping 30 tablet 3   empagliflozin (JARDIANCE) 10 MG TABS tablet Take 1 tablet (10 mg total) by mouth daily before breakfast. 30 tablet 9   escitalopram (LEXAPRO) 5 MG tablet Take 1 tablet (5 mg total) by mouth every evening. 30 tablet 5   esomeprazole (NEXIUM) 40 MG capsule Take 40 mg by mouth daily at 12 noon.     GABAPENTIN EX Apply 1 Application topically 3 (three) times daily as needed (To vulva). Medication: Gabapentin 6% cream     Homeopathic Products (ARNICARE) GEL Apply 1 application  topically daily as needed (skin bruising).     hyoscyamine (LEVSIN) 0.125 MG tablet TAKE 1-2 TABLETS EVERY 4 HOURS AS NEEDED FOR UP TO 10 DAYS FOR CRAMPING. 100 tablet 3   LORazepam (ATIVAN) 1 MG tablet Take 2 mg by mouth at bedtime.     meclizine (ANTIVERT) 12.5 MG tablet Take 1 tablet (12.5 mg total) by mouth 3 (three) times daily as needed for dizziness. 60 tablet 1   methocarbamol (ROBAXIN) 500 MG tablet Take 1 tablet (500 mg total) by mouth every 8 (eight) hours as needed for muscle spasms. 30 tablet 0   nitroGLYCERIN (NITROSTAT) 0.4 MG SL tablet Place 1 tablet (0.4 mg total) under the tongue every 5 (five) minutes as needed for chest pain (Call 911 at 3rd dose within 15 minutes.). 20 tablet 3   ondansetron (ZOFRAN-ODT) 8 MG disintegrating  tablet Take 1 tablet (8 mg total) by mouth every 8 (eight) hours as needed for nausea or vomiting. 12 tablet 3   Polyethyl Glycol-Propyl Glycol (LUBRICANT EYE DROPS) 0.4-0.3 % SOLN Place 1-2 drops into both eyes at bedtime.     polyethylene glycol (MIRALAX / GLYCOLAX) 17 g packet Take 17 g by mouth as needed for mild constipation or moderate constipation.     sodium chloride (OCEAN) 0.65 % SOLN nasal spray Place 1 spray into both nostrils at bedtime as needed for congestion.     No current facility-administered medications for this visit.    Family History  Problem Relation Age of Onset   Colon  cancer Mother        Dx age 79, died at age 19   Diabetes Father    Prostate cancer Father    Prostate cancer Brother    Pancreatic cancer Brother    Stomach cancer Son     Review of Systems  Exam:   There were no vitals taken for this visit.    General appearance: alert, cooperative and appears stated age Head: normocephalic, without obvious abnormality, atraumatic Neck: no adenopathy, supple, symmetrical, trachea midline and thyroid normal to inspection and palpation Lungs: clear to auscultation bilaterally Breasts: normal appearance, no masses or tenderness, No nipple retraction or dimpling, No nipple discharge or bleeding, No axillary adenopathy Heart: regular rate and rhythm Abdomen: soft, non-tender; no masses, no organomegaly Extremities: extremities normal, atraumatic, no cyanosis or edema Skin: skin color, texture, turgor normal. No rashes or lesions Lymph nodes: cervical, supraclavicular, and axillary nodes normal. Neurologic: grossly normal  Pelvic: External genitalia:  no lesions              No abnormal inguinal nodes palpated.              Urethra:  normal appearing urethra with no masses, tenderness or lesions              Bartholins and Skenes: normal                 Vagina: normal appearing vagina with normal color and discharge, no lesions              Cervix: no  lesions              Pap taken: {yes no:314532} Bimanual Exam:  Uterus:  normal size, contour, position, consistency, mobility, non-tender              Adnexa: no mass, fullness, tenderness              Rectal exam: {yes no:314532}.  Confirms.              Anus:  normal sphincter tone, no lesions  Chaperone was present for exam:  ***  Assessment:   Well woman visit with gynecologic exam.   Plan: Mammogram screening discussed. Self breast awareness reviewed. Pap and HR HPV as above. Guidelines for Calcium, Vitamin D, regular exercise program including cardiovascular and weight bearing exercise.   Follow up annually and prn.   Additional counseling given.  {yes [id number]. _______ minutes face to face time of which over 50% was spent in counseling.    After visit summary provided.

## 2023-05-27 NOTE — Progress Notes (Unsigned)
88 y.o. G30P2000 Married Caucasian female here for breast and pelvic check.   Patient is followed for chronic vulvitis, lichen sclerosus and VIN I on by 05/25/21.  Having some flares of discomfort of the vulva.  Using the gabapentin compounded up to three times a day.  It can be helpful.  She is not using Clobetasol at this point.  She has not used lidocaine on the vulva.   She is also followed for osteopenia and has BMD at this office.   Hx IBS.  Has constipation and diarrhea.  Followed by Dr. Marina Goodell.   Broke her left shoulder.  Tripped and fell. Doing PT.   PCP:   Dr. Posey Rea.   No LMP recorded. Patient has had a hysterectomy.           Sexually active: No.  The current method of family planning is status post hysterectomy.    Exercising: Yes.     walking Smoker:  no  Health Maintenance: Pap: many years ago  History of abnormal Pap:  no MMG:  broken shoulder, cancelled 02/14/23 appt;   01-18-21 3D/Neg/BiRads1  Colonoscopy:  2013 BMD:   11/08/20  Result  osteopenia TDaP:  12/12/17 Gardasil:   no HIV: unsure Hep C: unsure Screening Labs:  PCP   reports that she has never smoked. She has never used smokeless tobacco. She reports that she does not drink alcohol and does not use drugs.  Past Medical History:  Diagnosis Date   Acute blood loss anemia 12/25/2017   Allergic rhinitis    Anxiety    Arthritis    "my whole spine" (07/01/2017)   Arthritis    Atrial fibrillation (HCC)    Bowel obstruction (HCC)    in [place]   Bradycardia with 41-50 beats per minute 12/27/2017   Cancer (HCC)    Cellulitis of leg, right with large prepatella hematoma and open wounds 12/26/2017   CKD (chronic kidney disease) stage 2, GFR 60-89 ml/min    Colon polyps    Coronary artery disease    10/18 PCI/DES to p/m LCx with cutting balloon to mLcx   Diverticulosis of colon    GERD (gastroesophageal reflux disease)    Hip bursitis 2010   Dr Wyline Mood, Post op seroma   History of colon polyps     HSV (herpes simplex virus) anogenital infection 07/2019   HTN (hypertension)    IBS (irritable bowel syndrome)    constipation predominant - Dr Kinnie Scales   Lichen sclerosus    Osteopenia 11/2016   T score -2.0 FRAX 15%/4.3%   PAC (premature atrial contraction)    Symptomatiic   Renal insufficiency    Right bundle branch block (RBBB) on electrocardiogram (ECG) 12/27/2017   Scoliosis    SVT (supraventricular tachycardia)    brief history   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   biopsy showing vulvar atypia, possible VIN I    Past Surgical History:  Procedure Laterality Date   ANTERIOR AND POSTERIOR VAGINAL REPAIR  01/2002   Hattie Perch 01/23/2011   APPENDECTOMY  1946   CARDIAC CATHETERIZATION  06/26/2017   CORONARY ANGIOPLASTY WITH STENT PLACEMENT  07/01/2017   CORONARY ATHERECTOMY N/A 07/01/2017   Procedure: CORONARY ATHERECTOMY;  Surgeon: Lyn Records, MD;  Location: [facility];  Service: Cardiovascular;  Laterality: N/A;   CORONARY STENT INTERVENTION N/A 07/01/2017   Procedure: CORONARY STENT INTERVENTION;  Surgeon: Lyn Records, MD;  Location: [facility];  Service: Cardiovascular;  Laterality: N/A;  HAMMER TOE SURGERY     HEMORRHOID BANDING     HIP SURGERY Left 04/2009   hip examination under anesthesia followed by greater trochanteric bursectomy; iliotibial band tenotomy/notes 01/20/2011   I & D EXTREMITY Right 01/10/2018   Procedure: IRRIGATION AND DEBRIDEMENT RIGHT KNEE, APPLY WOUND VAC;  Surgeon: Nadara Mustard, MD;  Location: [facility] OR;  Service: Orthopedics;  Laterality: Right;   KNEE BURSECTOMY Right 04/2009   Hattie Perch 01/09/2011   LEFT ATRIAL APPENDAGE OCCLUSION N/A 08/24/2021   Procedure: LEFT ATRIAL APPENDAGE OCCLUSION;  Surgeon: Tonny Bollman, MD;  Location: [facility] INVASIVE CV LAB;  Service: Cardiovascular;  Laterality: N/A;   LEFT HEART CATH AND CORONARY ANGIOGRAPHY N/A 06/26/2017   Procedure: LEFT HEART CATH AND CORONARY ANGIOGRAPHY;  Surgeon:  Lyn Records, MD;  Location: [facility];  Service: Cardiovascular;  Laterality: N/A;   PUBOVAGINAL SLING  01/2002   Hattie Perch 01/23/2011   REDUCTION MAMMAPLASTY     TEE WITHOUT CARDIOVERSION N/A 08/24/2021   Procedure: TRANSESOPHAGEAL ECHOCARDIOGRAM (TEE);  Surgeon: Tonny Bollman, MD;  Location: [facility] INVASIVE CV LAB;  Service: Cardiovascular;  Laterality: N/A;   TEMPORARY PACEMAKER N/A 07/01/2017   Procedure: TEMPORARY PACEMAKER;  Surgeon: Lyn Records, MD;  Location: [facility] INVASIVE CV LAB;  Service: Cardiovascular;  Laterality: N/A;   VAGINAL HYSTERECTOMY  01/2002   Vaginal hysterectomy, bilateral salpingo-oophorectomy/notes 01/23/2011    Current Outpatient Medications  Medication Sig Dispense Refill   acetaminophen (TYLENOL) 500 MG tablet Take 1,000 mg by mouth every 6 (six) hours as needed (body aches / sore hip). Maximum of 6 tablets daily (3000mg )     apixaban (ELIQUIS) 2.5 MG TABS tablet Take 2.5 mg by mouth 2 (two) times daily.     BIOTIN PO Take 5,000 mcg by mouth in the morning.     diclofenac Sodium (VOLTAREN) 1 % GEL Apply 2 g topically 4 (four) times daily as needed (back pain.).     dicyclomine (BENTYL) 20 MG tablet Take 1 by mouth every 4-6 hours as needed for cramping 30 tablet 3   empagliflozin (JARDIANCE) 10 MG TABS tablet Take 1 tablet (10 mg total) by mouth daily before breakfast. 30 tablet 9   escitalopram (LEXAPRO) 5 MG tablet Take 1 tablet (5 mg total) by mouth every evening. 30 tablet 5   esomeprazole (NEXIUM) 40 MG capsule Take 40 mg by mouth daily at 12 noon.     GABAPENTIN EX Apply 1 Application topically 3 (three) times daily as needed (To vulva). Medication: Gabapentin 6% cream     Homeopathic Products (ARNICARE) GEL Apply 1 application  topically daily as needed (skin bruising).     hyoscyamine (LEVSIN) 0.125 MG tablet TAKE 1-2 TABLETS EVERY 4 HOURS AS NEEDED FOR UP TO 10 DAYS FOR CRAMPING. 100 tablet 3   LORazepam (ATIVAN) 1 MG tablet Take 2 mg by mouth at  bedtime.     meclizine (ANTIVERT) 12.5 MG tablet Take 1 tablet (12.5 mg total) by mouth 3 (three) times daily as needed for dizziness. 60 tablet 1   methocarbamol (ROBAXIN) 500 MG tablet Take 1 tablet (500 mg total) by mouth every 8 (eight) hours as needed for muscle spasms. 30 tablet 0   nitroGLYCERIN (NITROSTAT) 0.4 MG SL tablet Place 1 tablet (0.4 mg total) under the tongue every 5 (five) minutes as needed for chest pain (Call 911 at 3rd dose within 15 minutes.). 20 tablet 3   ondansetron (ZOFRAN-ODT) 8 MG disintegrating tablet Take 1 tablet (8 mg total) by  mouth every 8 (eight) hours as needed for nausea or vomiting. 12 tablet 3   Polyethyl Glycol-Propyl Glycol (LUBRICANT EYE DROPS) 0.4-0.3 % SOLN Place 1-2 drops into both eyes at bedtime.     polyethylene glycol (MIRALAX / GLYCOLAX) 17 g packet Take 17 g by mouth as needed for mild constipation or moderate constipation.     sodium chloride (OCEAN) 0.65 % SOLN nasal spray Place 1 spray into both nostrils at bedtime as needed for congestion.     No current facility-administered medications for this visit.    Family History  Problem Relation Age of Onset   Colon cancer Mother        Dx age 86, died at age 32   Diabetes Father    Prostate cancer Father    Prostate cancer Brother    Pancreatic cancer Brother    Stomach cancer Son     Review of Systems  All other systems reviewed and are negative.   Exam:   BP 136/88 (BP Location: Left Arm, Patient Position: Sitting, Cuff Size: Normal)   Pulse 70   Ht 5' 2.5" (1.588 m)   Wt 141 lb (64 kg)   SpO2 98%   BMI 25.38 kg/m     General appearance: alert, cooperative and appears stated age Head: normocephalic, without obvious abnormality, atraumatic Neck: no adenopathy, supple, symmetrical, trachea midline and thyroid normal to inspection and palpation Lungs: clear to auscultation bilaterally Breasts: consistent with bilateral breast reduction, no masses or tenderness, No nipple  retraction or dimpling, No nipple discharge or bleeding, No axillary adenopathy Heart: regular rate and rhythm Abdomen: soft, non-tender; no masses, no organomegaly Extremities: extremities normal, atraumatic, no cyanosis or edema Skin: skin color, texture, turgor normal. No rashes or lesions Lymph nodes: cervical, supraclavicular, and axillary nodes normal. Neurologic: grossly normal  Pelvic: External genitalia:  fissures of the left labia minora surrounded by some erythema and whitish skin change.              No abnormal inguinal nodes palpated.              Urethra:  normal appearing urethra with no masses, tenderness or lesions              Bartholins and Skenes: normal                 Vagina: normal appearing vagina with normal color and discharge, no lesions              Cervix: absent              Pap taken: no Bimanual Exam:  Uterus:  absent              Adnexa: no mass, fullness, tenderness              Rectal exam: yes.  Confirms.              Anus:  normal sphincter tone, no lesions  Chaperone was present for exam:  Warren Lacy, CMA  Assessment:   Well woman visit with gynecologic exam. Status post hysterectomy.  Chronic vulvitis.  Lichen sclerosus.  VIN I.  Osteopenia.  Status post bilateral breast reduction.   Plan: Mammogram screening discussed. Self breast awareness reviewed. Pap and HR HPV not indicated.  Guidelines for Calcium, Vitamin D, regular exercise program including cardiovascular and weight bearing exercise. Will schedule BMD.   Restart clobetasol ointment twice daily for 2 weeks for a flare and then twice  weekly at hs for maintenance.  Continue Gabapentin three times daily as needed. Rx for Lidocaine ointment three times daily to vulva as needed for pain.  Will do recheck of vulva in 2 months.   Follow up annually and prn.    After visit summary provided.   20 min total time was spent for this patient encounter, including preparation, face-to-face  counseling with the patient, coordination of care, and documentation of the encounter in addition to doing breast and pelvic exam.

## 2023-05-30 ENCOUNTER — Ambulatory Visit
Admission: RE | Admit: 2023-05-30 | Discharge: 2023-05-30 | Disposition: A | Discharge status: Home / Self Care | Payer: Medicare Other | Source: Ambulatory Visit | Attending: Obstetrics and Gynecology | Admitting: Obstetrics and Gynecology

## 2023-05-30 ENCOUNTER — Telehealth: Payer: Self-pay | Admitting: Physical Medicine and Rehabilitation

## 2023-05-30 DIAGNOSIS — Z1231 Encounter for screening mammogram for malignant neoplasm of breast: Secondary | ICD-10-CM

## 2023-05-30 NOTE — Telephone Encounter (Signed)
Patient called and wants an appointment for Cortizone shot in her back. CB#[phone]

## 2023-06-03 ENCOUNTER — Other Ambulatory Visit: Payer: Self-pay

## 2023-06-03 DIAGNOSIS — M6281 Muscle weakness (generalized): Secondary | ICD-10-CM | POA: Diagnosis not present

## 2023-06-03 DIAGNOSIS — M25612 Stiffness of left shoulder, not elsewhere classified: Secondary | ICD-10-CM | POA: Diagnosis not present

## 2023-06-03 DIAGNOSIS — M25412 Effusion, left shoulder: Secondary | ICD-10-CM | POA: Diagnosis not present

## 2023-06-03 DIAGNOSIS — M25512 Pain in left shoulder: Secondary | ICD-10-CM | POA: Diagnosis not present

## 2023-06-03 MED ORDER — EMPAGLIFLOZIN 10 MG PO TABS: 10.0000 mg | ORAL_TABLET | Freq: Every day | ORAL | 2 refills | Status: DC

## 2023-06-04 ENCOUNTER — Other Ambulatory Visit: Payer: Self-pay | Admitting: Physical Medicine and Rehabilitation

## 2023-06-04 ENCOUNTER — Encounter: Payer: Self-pay | Admitting: Internal Medicine

## 2023-06-04 ENCOUNTER — Ambulatory Visit: Payer: Medicare Other | Admitting: Internal Medicine

## 2023-06-04 VITALS — BP 140/100 | HR 40 | Ht 63.0 in | Wt 144.0 lb

## 2023-06-04 DIAGNOSIS — R1030 Lower abdominal pain, unspecified: Secondary | ICD-10-CM

## 2023-06-04 DIAGNOSIS — K581 Irritable bowel syndrome with constipation: Secondary | ICD-10-CM | POA: Diagnosis not present

## 2023-06-04 DIAGNOSIS — M5416 Radiculopathy, lumbar region: Secondary | ICD-10-CM

## 2023-06-04 MED ORDER — DICYCLOMINE HCL 20 MG PO TABS: ORAL_TABLET | ORAL | 6 refills | Status: DC

## 2023-06-04 NOTE — Progress Notes (Signed)
We performed bilateral L4 transforaminal epidural steroid injection in our office on 01/02/2023, she reports greater than 80% relief of pain for 3-4 months. I will go ahead and place order to repeat injection.

## 2023-06-04 NOTE — Patient Instructions (Signed)
We have sent the following medications to your pharmacy for you to pick up at your convenience: Bentyl  Please follow up as needed.  _______________________________________________________  If your blood pressure at your visit was 140/90 or greater, please contact your primary care physician to follow up on this.  _______________________________________________________  If you are age 86 or older, your body mass index should be between 23-30. Your Body mass index is 25.51 kg/m. If this is out of the aforementioned range listed, please consider follow up with your Primary Care Provider.  If you are age 61 or younger, your body mass index should be between 19-25. Your Body mass index is 25.51 kg/m. If this is out of the aformentioned range listed, please consider follow up with your Primary Care Provider.   ________________________________________________________  The [facility] providers would like to encourage you to use [facility] to communicate with providers for non-urgent requests or questions.  Due to long hold times on the telephone, sending your provider a message by [facility] may be a faster and more efficient way to get a response.  Please allow 48 business hours for a response.  Please remember that this is for non-urgent requests.  _______________________________________________________

## 2023-06-04 NOTE — Progress Notes (Signed)
HISTORY OF PRESENT ILLNESS:  Hailey [name] is a 88 y.o. female with multiple medical problems as listed below.  She is followed in this office for constipation predominant irritable bowel syndrome and diverticulitis.  She was last seen in the office August 15, 2022 for constipation predominant irritable bowel syndrome with episodic diarrhea.  See that dictation for details.  She was told to initiate Citrucel therapy and continue on dicyclomine as needed for pain and Zofran as needed for nausea.  Little over a month ago she began having problems with abdominal cramping associated with both constipation and diarrhea.  She contacted the office.  Prescribed dicyclomine.  This helped.  CT scan of the abdomen and pelvis with contrast was ordered prior to this appointment and performed May 16, 2023.  There was no acute process.  No evidence for diverticulitis.  At this point she tells me that she is doing well.  We discussed her medications including dicyclomine and Levsin sublingual.  We also discussed fiber and MiraLAX.  She is currently taking a multi supplement agent called "[name]" which she states has helped regulate her bowels.  I reviewed the ingredients.  Nothing concerning.  REVIEW OF SYSTEMS:  All non-GI ROS negative unless otherwise stated in the HPI except for hearing problems, back pain, arthritis, allergies  Past Medical History:  Diagnosis Date   Acute blood loss anemia 12/25/2017   Allergic rhinitis    Anxiety    Arthritis    "my whole spine" (07/01/2017)   Arthritis    Atrial fibrillation (HCC)    Bowel obstruction (HCC)    in [place]   Bradycardia with 41-50 beats per minute 12/27/2017   Cancer (HCC)    Cellulitis of leg, right with large prepatella hematoma and open wounds 12/26/2017   CKD (chronic kidney disease) stage 2, GFR 60-89 ml/min    Colon polyps    Coronary artery disease    10/18 PCI/DES to p/m LCx with cutting balloon to mLcx   Diverticulosis of colon     GERD (gastroesophageal reflux disease)    Hip bursitis 2010   Dr [name], Post op seroma   History of colon polyps    HSV (herpes simplex virus) anogenital infection 07/2019   HTN (hypertension)    IBS (irritable bowel syndrome)    constipation predominant - Dr [name]   Lichen sclerosus    Osteopenia 11/2016   T score -2.0 FRAX 15%/4.3%   PAC (premature atrial contraction)    Symptomatiic   Renal insufficiency    Right bundle branch block (RBBB) on electrocardiogram (ECG) 12/27/2017   Scoliosis    SVT (supraventricular tachycardia)    brief history   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   biopsy showing vulvar atypia, possible VIN I    Past Surgical History:  Procedure Laterality Date   ANTERIOR AND POSTERIOR VAGINAL REPAIR  01/2002   [name] 01/23/2011   APPENDECTOMY  1948   CARDIAC CATHETERIZATION  06/26/2017   CORONARY ANGIOPLASTY WITH STENT PLACEMENT  07/01/2017   CORONARY ATHERECTOMY N/A 07/01/2017   Procedure: CORONARY ATHERECTOMY;  Surgeon: [facility] Records, MD;  [facility]: [name];  Service: Cardiovascular;  Laterality: N/A;   CORONARY STENT INTERVENTION N/A 07/01/2017   Procedure: CORONARY STENT INTERVENTION;  Surgeon: [facility] Records, MD;  [facility]: [name];  Service: Cardiovascular;  Laterality: N/A;   HAMMER TOE SURGERY     HEMORRHOID BANDING     HIP SURGERY Left 04/2009   hip  examination under anesthesia followed by greater trochanteric bursectomy; iliotibial band tenotomy/notes 01/20/2011   I & D EXTREMITY Right 01/10/2018   Procedure: IRRIGATION AND DEBRIDEMENT RIGHT KNEE, APPLY WOUND VAC;  Surgeon: [facility] Mustard, MD;  [facility];  Service: Orthopedics;  Laterality: Right;   KNEE BURSECTOMY Right 04/2009   [name] 01/09/2011   LEFT ATRIAL APPENDAGE OCCLUSION N/A 08/24/2021   Procedure: LEFT ATRIAL APPENDAGE OCCLUSION;  Surgeon: [facility] Bollman, MD;  [facility]: [name];  Service: Cardiovascular;  Laterality: N/A;   LEFT  HEART CATH AND CORONARY ANGIOGRAPHY N/A 06/26/2017   Procedure: LEFT HEART CATH AND CORONARY ANGIOGRAPHY;  Surgeon: [facility] Records, MD;  [facility]: [name];  Service: Cardiovascular;  Laterality: N/A;   PUBOVAGINAL SLING  01/2002   [name] 01/23/2011   REDUCTION MAMMAPLASTY     TEE WITHOUT CARDIOVERSION N/A 08/24/2021   Procedure: TRANSESOPHAGEAL ECHOCARDIOGRAM (TEE);  Surgeon: [facility] Bollman, MD;  [facility]: [name];  Service: Cardiovascular;  Laterality: N/A;   TEMPORARY PACEMAKER N/A 07/01/2017   Procedure: TEMPORARY PACEMAKER;  Surgeon: [facility] Records, MD;  [facility]: [name];  Service: Cardiovascular;  Laterality: N/A;   VAGINAL HYSTERECTOMY  01/2002   Vaginal hysterectomy, bilateral salpingo-oophorectomy/notes 01/23/2011    Social History [name]  reports that she has never smoked. She has never used smokeless tobacco. She reports that she does not drink alcohol and does not use drugs.  family history includes Colon cancer in her mother; Diabetes in her father; Pancreatic cancer in her brother; Prostate cancer in her brother and father; Stomach cancer in her son.  Allergies  Allergen Reactions   Macrobid [Nitrofurantoin Monohyd Macro] Other (See Comments)    Nausea, stomach cramps, fatigue , headache.   Meloxicam Other (See Comments)    Jittery and headache   Digoxin And Related Other (See Comments)    headaches   Diprolene [Betamethasone Dipropionate Aug] Other (See Comments)    Patient states that the bruising was made worse when using this   Ferrous Sulfate Other (See Comments)    Bad constipation    Keflex [Cephalexin] Nausea And Vomiting    Nausea, fatigue, and headache.   Lipitor [Atorvastatin]     Abd pain   Mobic [Meloxicam] Other (See Comments)    Unsure of reaction type   Sulfamethoxazole-Trimethoprim Nausea Only       PHYSICAL EXAMINATION: Vital signs: BP (!) 140/100   Pulse (!) 40   Ht 5\' 3"  (1.6 m)   Wt 144 lb (65.3  kg)   BMI 25.51 kg/m   Constitutional: generally well-appearing, no acute distress Psychiatric: alert and oriented x3, cooperative Eyes: extraocular movements intact, anicteric, conjunctiva pink Mouth: oral pharynx moist, no lesions Neck: supple no lymphadenopathy Cardiovascular: heart regular rate and rhythm, no murmur Lungs: clear to auscultation bilaterally Abdomen: soft, nontender, nondistended, no obvious ascites, no peritoneal signs, normal bowel sounds, no organomegaly Rectal: Omitted Extremities: no clubbing, cyanosis, or lower extremity edema bilaterally Skin: no lesions on visible extremities Neuro: No focal deficits.  Cranial nerves intact  ASSESSMENT:  1.  Irritable bowel syndrome.  Constipation predominant with episodic diarrhea.  Recent flare.  Now better. 2.  Negative recent CT 3.  History of diverticulitis 4.  Colonoscopy 2013 with Dr. Kinnie [name] revealed diverticulosis and hemorrhoids 5.  Multiple general medical problems under the care of Dr. Posey Rea   PLAN:  1.  Continue fiber 2.  Continue dicyclomine as needed for cramping pain.  Refilled. 3.  Ongoing general medical care with Dr. Posey Rea 4.  GI follow-up as needed A total time of 30 minutes was spent preparing to see the patient, reviewing data, obtaining interval history, performing medically appropriate physical examination, counseling and educating the patient regarding the above listed issues, ordering medication, and documenting clinical information in the health record

## 2023-06-04 NOTE — Telephone Encounter (Signed)
Spoke with patient and she is having the same type of pain as before. She stated the last injection lasted 3-4 months with over 75% relief. She does have radiation going down one leg, but she did not specify which leg. Please advise

## 2023-06-05 ENCOUNTER — Other Ambulatory Visit: Payer: Self-pay

## 2023-06-05 DIAGNOSIS — S42212D Unspecified displaced fracture of surgical neck of left humerus, subsequent encounter for fracture with routine healing: Secondary | ICD-10-CM | POA: Diagnosis not present

## 2023-06-05 DIAGNOSIS — M25511 Pain in right shoulder: Secondary | ICD-10-CM | POA: Diagnosis not present

## 2023-06-05 MED ORDER — EMPAGLIFLOZIN 10 MG PO TABS: 10.0000 mg | ORAL_TABLET | Freq: Every day | ORAL | 2 refills | Status: DC

## 2023-06-06 ENCOUNTER — Telehealth: Payer: Self-pay | Admitting: Internal Medicine

## 2023-06-06 ENCOUNTER — Other Ambulatory Visit: Payer: Self-pay | Admitting: Internal Medicine

## 2023-06-06 DIAGNOSIS — M25412 Effusion, left shoulder: Secondary | ICD-10-CM | POA: Diagnosis not present

## 2023-06-06 DIAGNOSIS — M25512 Pain in left shoulder: Secondary | ICD-10-CM | POA: Diagnosis not present

## 2023-06-06 DIAGNOSIS — M25612 Stiffness of left shoulder, not elsewhere classified: Secondary | ICD-10-CM | POA: Diagnosis not present

## 2023-06-06 DIAGNOSIS — M6281 Muscle weakness (generalized): Secondary | ICD-10-CM | POA: Diagnosis not present

## 2023-06-06 MED ORDER — LOSARTAN POTASSIUM 25 MG PO TABS: 25.0000 mg | ORAL_TABLET | Freq: Every day | ORAL | 3 refills | Status: DC

## 2023-06-06 NOTE — Telephone Encounter (Signed)
Restart losartan 25 mg daily per Dr. Lynnette Caffey.  Pt has f/u next week in office.  Will bring list of BP readings w her.

## 2023-06-06 NOTE — Telephone Encounter (Signed)
Pt c/o BP issue: STAT if pt c/o blurred vision, one-sided weakness or slurred speech  1. What are your last 5 BP readings? 147/117, 145/96,153/111, 140/100  2. Are you having any other symptoms (ex. Dizziness, headache, blurred vision, passed out)? No   3. What is your BP issue? Pt states she has been experiencing high BP and would like to speak with a nurse about this. Please advise

## 2023-06-06 NOTE — Telephone Encounter (Signed)
Spoke with patient she states she has been experiencing elevated BP since Tuesday. At gastro appointment it was 14/100 and they advised her to continue to monitor. Yesterday her BP was  145/96 at 10 am , 153/111 at 5 pm. This morning her BP was 147/117 at 8 am. She is asymptomatic. She states being that she was experiencing hypotension her losartan and metoprolol was d/c. She states she still have the medication and wanted to know if you would like for her to restart taking her BP medication. Did advise to continue to monitor for now. Discussed ED precautions. Will forward to provider.

## 2023-06-10 DIAGNOSIS — M25612 Stiffness of left shoulder, not elsewhere classified: Secondary | ICD-10-CM | POA: Diagnosis not present

## 2023-06-10 DIAGNOSIS — M6281 Muscle weakness (generalized): Secondary | ICD-10-CM | POA: Diagnosis not present

## 2023-06-10 DIAGNOSIS — M25512 Pain in left shoulder: Secondary | ICD-10-CM | POA: Diagnosis not present

## 2023-06-10 DIAGNOSIS — M25412 Effusion, left shoulder: Secondary | ICD-10-CM | POA: Diagnosis not present

## 2023-06-12 NOTE — Progress Notes (Signed)
Cardiology Office Note:   Date:  06/14/2023  ID:  [name], DOB 12/18/1934, MRN [id number] PCP:  Tresa Garter, MD  [facility] Providers Cardiologist:  Alverda Skeans, MD Referring MD: Tresa Garter, MD   Chief Complaint/Reason for Referral: Cardiology follow-up ASSESSMENT:    1. Coronary artery disease involving native coronary artery of native heart with angina pectoris (HCC)   2. Aortic atherosclerosis (HCC)   3. Permanent atrial fibrillation (HCC)   4. Hyperlipidemia LDL goal <70   5. Primary hypertension   6. Stage 3a chronic kidney disease (HCC)   7. Chronic diastolic HF (heart failure) (HCC)     PLAN:   In order of problems listed above: 1.  Coronary artery disease: The patient is doing well and is without angina. 2.  Aortic atherosclerosis: Continue low-dose Eliquis and the patient refused statins. 3.  Permanent atrial fibrillation: Continue low-dose Eliquis; the patient is off Toprol 12.5 mg daily which was discontinued during her admission for her fall.  She has occasional palpitations.  I have asked her to restart this based on her symptoms and to let us know.  Right now she is going to hold off on this. 4.  Hyperlipidemia: The patient refuses statin therapy.  Her LDL in June was 57.  5.  Hypertension: Blood pressure is well-controlled on her current regimen. 6.  Stage III chronic kidney disease: Continue losartan and Jardiance for renal protection. 7.  Chronic diastolic heart failure: Continue losartan and Jardiance.             Dispo:  No follow-ups on file.      Medication Adjustments/Labs and Tests Ordered: Current medicines are reviewed at length with the patient today.  Concerns regarding medicines are outlined above.  The following changes have been made:  no change   Labs/tests ordered: Orders Placed This Encounter  Procedures   EKG 12-Lead    Medication Changes: No orders of the defined types were placed in this  encounter.   Current medicines are reviewed at length with the patient today.  The patient does not have concerns regarding medicines.  History of Present Illness:    FOCUSED PROBLEM LIST:   Coronary artery disease S/p orbital atherectomy + 4 x 26 mm DES to pRCA on 07/02/17 LHC 06/26/17: 3v heavy Ca2++, pRCA 85-90, mRCA 50-60, dLM 25, mLAD 50, EF 60 Myoview 09/26/2018: EF 71, no ischemia or infarction, low risk  (HFpEF) heart failure with preserved ejection fraction  TTE 02/17/20: EF 65-70, no RWMA, mild LVH, Gr 2 DD, mod reduced RVSF, mod pulm hypertension, RVSP 58.7, mod BAE, mild MR, mod TR, AV sclerosis, RAP 8 TEE 08/24/21: EF 60-65, no RWMA, NL RVSF, NL PASP, severe LAE, small eff, mild MR, asc Aorta 40 mm Permanent atrial fibrillation; CV 2 score of 6 Attempted LAAOD (Watchman) in 08/2021 unsuccessful 2/2 unfavorable anatomy Mitral regurgitation Tricuspid regurgitation  Hx of falls Now on low dose Eliquis Chronic kidney disease stage III Hypertension  Hx of orthostatic hypotension Right Bundle Branch Block  Supraventricular Tachycardia   Ascending aortic aneurysm  TTE 08/2021: 40 mm CT 02/07/23: 4.4 cm  Aortic atherosclerosis  Lung nodule CT 01/2023: 4 mm RUL Aortic atherosclerosis on CT abdomen pelvis September 2024  October 2024: The patient is seen for routine follow-up.  She was seen in June for hospital follow-up.  She was admitted with mechanical fall and suffered left humerus fracture that was treated conservatively.  She was noted to have some bradycardia.  Her metoprolol was discontinued and 30-day monitor was arranged.  This monitor demonstrated 100% burden of atrial fibrillation with nocturnal bradycardia.  Today her major issue is still recuperating from her fall.  She is participating in physical therapy.  She has had no issues with her Eliquis other than nuisance lower extremity bruising.  She has had no severe bleeding.  Her breathing is fairly stable.  She will get  a little bit more short of breath when she exerts herself more than moderately.  She denies any exertional angina.  She has had no recurrent falls, lightheadedness, or signs or symptoms of stroke.  She does feel palpitations occasionally typically when she lays down to go to sleep.  They are not associate with shortness of breath, chest pain, or any other issues other than being noticeable.  She is otherwise well and without significant complaints.     Current Medications: Current Meds  Medication Sig   acetaminophen (TYLENOL) 500 MG tablet Take 1,000 mg by mouth every 6 (six) hours as needed (body aches / sore hip). Maximum of 6 tablets daily (3000mg )   apixaban (ELIQUIS) 2.5 MG TABS tablet Take 2.5 mg by mouth 2 (two) times daily.   BIOTIN PO Take 5,000 mcg by mouth in the morning.   clobetasol ointment (TEMOVATE) 0.05 % Apply to the affected area in a thin layer twice a day for 2 weeks as needed for a flare.  Then apply to the area twice a week at bedtime as maintenance dosing.   diclofenac Sodium (VOLTAREN) 1 % GEL Apply 2 g topically 4 (four) times daily as needed (back pain.).   dicyclomine (BENTYL) 20 MG tablet Take 1 by mouth every 4-6 hours as needed for cramping   empagliflozin (JARDIANCE) 10 MG TABS tablet Take 1 tablet (10 mg total) by mouth daily before breakfast.   escitalopram (LEXAPRO) 5 MG tablet Take 1 tablet (5 mg total) by mouth every evening.   esomeprazole (NEXIUM) 40 MG capsule Take 40 mg by mouth daily at 12 noon.   GABAPENTIN EX Apply 1 Application topically 3 (three) times daily as needed (To vulva). Medication: Gabapentin 6% cream   Homeopathic Products (ARNICARE) GEL Apply 1 application  topically daily as needed (skin bruising).   hyoscyamine (LEVSIN) 0.125 MG tablet TAKE 1-2 TABLETS EVERY 4 HOURS AS NEEDED FOR UP TO 10 DAYS FOR CRAMPING.   lidocaine (XYLOCAINE) 5 % ointment Apply 1 Application topically 3 (three) times daily. Uses as needed three times a day for pain  of the vulva.   LORazepam (ATIVAN) 1 MG tablet TAKE ONE TABLET BY MOUTH TWICE DAILY AS NEEDED FOR ANXIETY / SLEEP.   losartan (COZAAR) 25 MG tablet Take 1 tablet (25 mg total) by mouth daily.   meclizine (ANTIVERT) 12.5 MG tablet Take 1 tablet (12.5 mg total) by mouth 3 (three) times daily as needed for dizziness.   methocarbamol (ROBAXIN) 500 MG tablet Take 1 tablet (500 mg total) by mouth every 8 (eight) hours as needed for muscle spasms.   nitroGLYCERIN (NITROSTAT) 0.4 MG SL tablet Place 1 tablet (0.4 mg total) under the tongue every 5 (five) minutes as needed for chest pain (Call 911 at 3rd dose within 15 minutes.).   ondansetron (ZOFRAN-ODT) 8 MG disintegrating tablet Take 1 tablet (8 mg total) by mouth every 8 (eight) hours as needed for nausea or vomiting.   Polyethyl Glycol-Propyl Glycol (LUBRICANT EYE DROPS) 0.4-0.3 % SOLN Place 1-2 drops into both eyes at bedtime.   polyethylene  glycol (MIRALAX / GLYCOLAX) 17 g packet Take 17 g by mouth as needed for mild constipation or moderate constipation.   sodium chloride (OCEAN) 0.65 % SOLN nasal spray Place 1 spray into both nostrils at bedtime as needed for congestion.      Review of Systems:   Please see the history of present illness.    All other systems reviewed and are negative.      EKGs/Labs/Other Test Reviewed:   EKG:    EKG Interpretation Date/Time:  Friday June 14 2023 10:08:38 EDT Ventricular Rate:  80 PR Interval:    QRS Duration:  124 QT Interval:  440 QTC Calculation: 507 R Axis:   9  Text Interpretation: Atrial fibrillation with a competing junctional pacemaker with premature ventricular or aberrantly conducted complexes Right bundle branch block Septal infarct , age undetermined When compared with ECG of 26-Apr-2022 02:01, Atrial fibrillation has replaced Sinus rhythm Septal infarct is now Present Nonspecific T wave abnormality has replaced inverted T waves in Inferior leads Confirmed by Alverda Skeans ([id number]) on  06/14/2023 10:10:23 AM          Risk Assessment/Calculations:    CHA2DS2-VASc Score = 6   This indicates a 9.7% annual risk of stroke. The patient's score is based upon: CHF History: 1 HTN History: 1 Diabetes History: 0 Stroke History: 0 Vascular Disease History: 1 Age Score: 2 Gender Score: 1         Physical Exam:   VS:  BP 110/78   Pulse 80   Ht 5\' 3"  (1.6 m)   Wt 143 lb (64.9 kg)   BMI 25.33 kg/m        Wt Readings from Last 3 Encounters:  06/14/23 143 lb (64.9 kg)  06/04/23 144 lb (65.3 kg)  05/27/23 141 lb (64 kg)      GENERAL:  No apparent distress, AOx3 HEENT:  No carotid bruits, +2 carotid impulses, no scleral icterus CAR: Irregular RR no murmurs, gallops, rubs, or thrills RES:  Clear to auscultation bilaterally ABD:  Soft, nontender, nondistended, positive bowel sounds x 4 VASC:  +2 radial pulses, +2 carotid pulses NEURO:  CN 2-12 grossly intact; motor and sensory grossly intact PSYCH:  No active depression or anxiety EXT:  No edema, ecchymosis, or cyanosis  Signed, Orbie Pyo, MD  06/14/2023 10:51 AM    [facility] [address] [place], [place], [place]  [address] Phone: [phone]; Fax: [phone]   Note:  This document was prepared using Dragon voice recognition software and may include unintentional dictation errors.

## 2023-06-14 ENCOUNTER — Ambulatory Visit: Payer: Medicare Other | Attending: Internal Medicine | Admitting: Internal Medicine

## 2023-06-14 ENCOUNTER — Encounter: Payer: Self-pay | Admitting: Internal Medicine

## 2023-06-14 VITALS — BP 110/78 | HR 80 | Ht 63.0 in | Wt 143.0 lb

## 2023-06-14 DIAGNOSIS — I5032 Chronic diastolic (congestive) heart failure: Secondary | ICD-10-CM

## 2023-06-14 DIAGNOSIS — M25512 Pain in left shoulder: Secondary | ICD-10-CM | POA: Diagnosis not present

## 2023-06-14 DIAGNOSIS — I4821 Permanent atrial fibrillation: Secondary | ICD-10-CM

## 2023-06-14 DIAGNOSIS — I10 Essential (primary) hypertension: Secondary | ICD-10-CM

## 2023-06-14 DIAGNOSIS — M6281 Muscle weakness (generalized): Secondary | ICD-10-CM | POA: Diagnosis not present

## 2023-06-14 DIAGNOSIS — M25612 Stiffness of left shoulder, not elsewhere classified: Secondary | ICD-10-CM | POA: Diagnosis not present

## 2023-06-14 DIAGNOSIS — E785 Hyperlipidemia, unspecified: Secondary | ICD-10-CM | POA: Diagnosis not present

## 2023-06-14 DIAGNOSIS — N1831 Chronic kidney disease, stage 3a: Secondary | ICD-10-CM | POA: Diagnosis not present

## 2023-06-14 DIAGNOSIS — M25412 Effusion, left shoulder: Secondary | ICD-10-CM | POA: Diagnosis not present

## 2023-06-14 DIAGNOSIS — I700 Atherosclerosis of aorta: Secondary | ICD-10-CM

## 2023-06-14 DIAGNOSIS — I25119 Atherosclerotic heart disease of native coronary artery with unspecified angina pectoris: Secondary | ICD-10-CM | POA: Diagnosis not present

## 2023-06-14 NOTE — Patient Instructions (Signed)
Medication Instructions:   Your physician recommends that you continue on your current medications as directed. Please refer to the Current Medication list given to you today.  *If you need a refill on your cardiac medications before your next appointment, please call your pharmacy*    Follow-Up: At [facility], you and your health needs are our priority.  As part of our continuing mission to provide you with exceptional heart care, we have created designated Provider Care Teams.  These Care Teams include your primary Cardiologist (physician) and Advanced Practice Providers (APPs -  Physician Assistants and Nurse Practitioners) who all work together to provide you with the care you need, when you need it.  We recommend signing up for the patient portal called "MyChart".  Sign up information is provided on this After Visit Summary.  MyChart is used to connect with patients for Virtual Visits (Telemedicine).  Patients are able to view lab/test results, encounter notes, upcoming appointments, etc.  Non-urgent messages can be sent to your provider as well.   To learn more about what you can do with MyChart, go to [email or web address].    Your next appointment:   6 month(s)  Provider:   Orbie Pyo, MD

## 2023-06-17 DIAGNOSIS — M25512 Pain in left shoulder: Secondary | ICD-10-CM | POA: Diagnosis not present

## 2023-06-17 DIAGNOSIS — M6281 Muscle weakness (generalized): Secondary | ICD-10-CM | POA: Diagnosis not present

## 2023-06-17 DIAGNOSIS — M25412 Effusion, left shoulder: Secondary | ICD-10-CM | POA: Diagnosis not present

## 2023-06-17 DIAGNOSIS — M25612 Stiffness of left shoulder, not elsewhere classified: Secondary | ICD-10-CM | POA: Diagnosis not present

## 2023-06-19 ENCOUNTER — Ambulatory Visit: Payer: Medicare Other | Admitting: Physical Medicine and Rehabilitation

## 2023-06-19 ENCOUNTER — Other Ambulatory Visit: Payer: Self-pay

## 2023-06-19 VITALS — BP 118/81 | HR 86

## 2023-06-19 DIAGNOSIS — M5416 Radiculopathy, lumbar region: Secondary | ICD-10-CM

## 2023-06-19 MED ORDER — METHYLPREDNISOLONE ACETATE 40 MG/ML IJ SUSP
40.0000 mg | Freq: Once | INTRAMUSCULAR | Status: AC
Start: 2023-06-19 — End: 2023-06-19
  Administered 2023-06-19: 40 mg

## 2023-06-19 NOTE — Progress Notes (Signed)
Functional Pain Scale - descriptive words and definitions  Moderate (4)   Constantly aware of pain, can complete ADLs with modification/sleep marginally affected at times/passive distraction is of no use, but active distraction gives some relief. Moderate range order  Average Pain  varies   +Driver, -BT, -Dye Allergies.  Lower back pain on both sides

## 2023-06-19 NOTE — Patient Instructions (Signed)

## 2023-06-20 ENCOUNTER — Ambulatory Visit: Payer: Medicare Other | Admitting: Family Medicine

## 2023-06-20 ENCOUNTER — Encounter: Payer: Self-pay | Admitting: Family Medicine

## 2023-06-20 ENCOUNTER — Other Ambulatory Visit: Payer: Self-pay

## 2023-06-20 VITALS — BP 110/82 | Ht 63.0 in | Wt 140.0 lb

## 2023-06-20 DIAGNOSIS — M25562 Pain in left knee: Secondary | ICD-10-CM

## 2023-06-20 DIAGNOSIS — M23204 Derangement of unspecified medial meniscus due to old tear or injury, left knee: Secondary | ICD-10-CM | POA: Insufficient documentation

## 2023-06-20 NOTE — Progress Notes (Signed)
DATE OF VISIT: 06/20/2023        Hailey [name] DOB: [birth year] MRN: [id number]  CC:  LT knee pain  History- Hailey [name] is a 88 y.o.  female for evaluation and treatment of LT knee pain. PMH lumbar DD s/p ESIs, right knee pain, CKD, Factor XI deficiency, chronic anticoagulation with Eliquis for afib, GERD, SVT  Lt knee pain along medial knee Having some shooting pains in the knee for several days Tried compression sleeve - some help Needed to give away tickets to theatre  Taking Tylenol prn Using Voltaren gel prn Had epidural for Lspine yesterday  - has helped knee pain No numbness/tingling No swelling No clicking, catching, popping  Last seen by Dr Darrick Penna 12/11/22 for Rt knee pain Had acute flare after exercise Underwent cortisone injection at that time MSK U/S 12/06/22 of the knee with Dr Margaretha Sheffield showing: effusion and spur/bulging around medial meniscus thought to be OA flare   Past Medical History Past Medical History:  Diagnosis Date   Acute blood loss anemia 12/25/2017   Allergic rhinitis    Anxiety    Arthritis    "my whole spine" (07/01/2017)   Arthritis    Atrial fibrillation (HCC)    Bowel obstruction (HCC)    in [place]   Bradycardia with 41-50 beats per minute 12/27/2017   Cancer (HCC)    Cellulitis of leg, right with large prepatella hematoma and open wounds 12/26/2017   CKD (chronic kidney disease) stage 2, GFR 60-89 ml/min    Colon polyps    Coronary artery disease    10/18 PCI/DES to p/m LCx with cutting balloon to mLcx   Diverticulosis of colon    GERD (gastroesophageal reflux disease)    Hip bursitis 2010   Dr Wyline Mood, Post op seroma   History of colon polyps    HSV (herpes simplex virus) anogenital infection 07/2019   HTN (hypertension)    IBS (irritable bowel syndrome)    constipation predominant - Dr Kinnie Scales   Lichen sclerosus    Osteopenia 11/2016   T score -2.0 FRAX 15%/4.3%   PAC (premature atrial contraction)    Symptomatiic   Renal  insufficiency    Right bundle branch block (RBBB) on electrocardiogram (ECG) 12/27/2017   Scoliosis    SVT (supraventricular tachycardia) (HCC)    brief history   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   biopsy showing vulvar atypia, possible VIN I    Past Surgical History Past Surgical History:  Procedure Laterality Date   ANTERIOR AND POSTERIOR VAGINAL REPAIR  01/2002   Hattie Perch 01/23/2011   APPENDECTOMY  1948   CARDIAC CATHETERIZATION  06/26/2017   CORONARY ANGIOPLASTY WITH STENT PLACEMENT  07/01/2017   CORONARY ATHERECTOMY N/A 07/01/2017   Procedure: CORONARY ATHERECTOMY;  Surgeon: Lyn Records, MD;  [facility]: [facility];  Service: Cardiovascular;  Laterality: N/A;   CORONARY STENT INTERVENTION N/A 07/01/2017   Procedure: CORONARY STENT INTERVENTION;  Surgeon: Lyn Records, MD;  Location: [facility];  Service: Cardiovascular;  Laterality: N/A;   HAMMER TOE SURGERY     HEMORRHOID BANDING     HIP SURGERY Left 04/2009   hip examination under anesthesia followed by greater trochanteric bursectomy; iliotibial band tenotomy/notes 01/20/2011   I & D EXTREMITY Right 01/10/2018   Procedure: IRRIGATION AND DEBRIDEMENT RIGHT KNEE, APPLY WOUND VAC;  Surgeon: Nadara Mustard, MD;  Location: [facility] OR;  Service: Orthopedics;  Laterality: Right;   KNEE BURSECTOMY Right 04/2009   /  notes 01/09/2011   LEFT ATRIAL APPENDAGE OCCLUSION N/A 08/24/2021   Procedure: LEFT ATRIAL APPENDAGE OCCLUSION;  Surgeon: Tonny Bollman, MD;  Location: [facility] INVASIVE CV LAB;  Service: Cardiovascular;  Laterality: N/A;   LEFT HEART CATH AND CORONARY ANGIOGRAPHY N/A 06/26/2017   Procedure: LEFT HEART CATH AND CORONARY ANGIOGRAPHY;  Surgeon: Lyn Records, MD;  Location: [facility];  Service: Cardiovascular;  Laterality: N/A;   PUBOVAGINAL SLING  01/2002   Hattie Perch 01/23/2011   REDUCTION MAMMAPLASTY     TEE WITHOUT CARDIOVERSION N/A 08/24/2021   Procedure: TRANSESOPHAGEAL ECHOCARDIOGRAM (TEE);   Surgeon: Tonny Bollman, MD;  Location: [facility] INVASIVE CV LAB;  Service: Cardiovascular;  Laterality: N/A;   TEMPORARY PACEMAKER N/A 07/01/2017   Procedure: TEMPORARY PACEMAKER;  Surgeon: Lyn Records, MD;  Location: [facility] INVASIVE CV LAB;  Service: Cardiovascular;  Laterality: N/A;   VAGINAL HYSTERECTOMY  01/2002   Vaginal hysterectomy, bilateral salpingo-oophorectomy/notes 01/23/2011    Medications Current Outpatient Medications  Medication Sig Dispense Refill   acetaminophen (TYLENOL) 500 MG tablet Take 1,000 mg by mouth every 6 (six) hours as needed (body aches / sore hip). Maximum of 6 tablets daily (3000mg )     apixaban (ELIQUIS) 2.5 MG TABS tablet Take 2.5 mg by mouth 2 (two) times daily.     BIOTIN PO Take 5,000 mcg by mouth in the morning.     clobetasol ointment (TEMOVATE) 0.05 % Apply to the affected area in a thin layer twice a day for 2 weeks as needed for a flare.  Then apply to the area twice a week at bedtime as maintenance dosing. 60 g 1   diclofenac Sodium (VOLTAREN) 1 % GEL Apply 2 g topically 4 (four) times daily as needed (back pain.).     dicyclomine (BENTYL) 20 MG tablet Take 1 by mouth every 4-6 hours as needed for cramping 30 tablet 6   empagliflozin (JARDIANCE) 10 MG TABS tablet Take 1 tablet (10 mg total) by mouth daily before breakfast. 90 tablet 2   escitalopram (LEXAPRO) 5 MG tablet Take 1 tablet (5 mg total) by mouth every evening. 30 tablet 5   esomeprazole (NEXIUM) 40 MG capsule Take 40 mg by mouth daily at 12 noon.     GABAPENTIN EX Apply 1 Application topically 3 (three) times daily as needed (To vulva). Medication: Gabapentin 6% cream     Homeopathic Products (ARNICARE) GEL Apply 1 application  topically daily as needed (skin bruising).     hyoscyamine (LEVSIN) 0.125 MG tablet TAKE 1-2 TABLETS EVERY 4 HOURS AS NEEDED FOR UP TO 10 DAYS FOR CRAMPING. 100 tablet 3   lidocaine (XYLOCAINE) 5 % ointment Apply 1 Application topically 3 (three) times daily. Uses as  needed three times a day for pain of the vulva. 1.25 g 1   LORazepam (ATIVAN) 1 MG tablet TAKE ONE TABLET BY MOUTH TWICE DAILY AS NEEDED FOR ANXIETY / SLEEP. 180 tablet 1   losartan (COZAAR) 25 MG tablet Take 1 tablet (25 mg total) by mouth daily. 90 tablet 3   meclizine (ANTIVERT) 12.5 MG tablet Take 1 tablet (12.5 mg total) by mouth 3 (three) times daily as needed for dizziness. 60 tablet 1   methocarbamol (ROBAXIN) 500 MG tablet Take 1 tablet (500 mg total) by mouth every 8 (eight) hours as needed for muscle spasms. 30 tablet 0   nitroGLYCERIN (NITROSTAT) 0.4 MG SL tablet Place 1 tablet (0.4 mg total) under the tongue every 5 (five) minutes as needed for chest pain (  Call 911 at 3rd dose within 15 minutes.). 20 tablet 3   ondansetron (ZOFRAN-ODT) 8 MG disintegrating tablet Take 1 tablet (8 mg total) by mouth every 8 (eight) hours as needed for nausea or vomiting. 12 tablet 3   Polyethyl Glycol-Propyl Glycol (LUBRICANT EYE DROPS) 0.4-0.3 % SOLN Place 1-2 drops into both eyes at bedtime.     polyethylene glycol (MIRALAX / GLYCOLAX) 17 g packet Take 17 g by mouth as needed for mild constipation or moderate constipation.     sodium chloride (OCEAN) 0.65 % SOLN nasal spray Place 1 spray into both nostrils at bedtime as needed for congestion.     Current Facility-Administered Medications  Medication Dose Route Frequency Provider Last Rate Last Admin   methylPREDNISolone acetate (DEPO-MEDROL) injection 40 mg  40 mg Other Once         Allergies is allergic to macrobid [nitrofurantoin monohyd macro], meloxicam, digoxin and related, diprolene [betamethasone dipropionate aug], ferrous sulfate, keflex [cephalexin], lipitor [atorvastatin], mobic [meloxicam], and sulfamethoxazole-trimethoprim.  Family History - reviewed per EMR and intake form  Social History   reports no history of alcohol use.  reports that she has never smoked. She has never used smokeless tobacco.  reports no history of drug  use.  EXAM: Vitals: BP 110/82   Ht 5\' 3"  (1.6 m)   Wt 140 lb (63.5 kg)   BMI 24.80 kg/m  General: AOx3, NAD, pleasant SKIN: Ecchymosis along lower legs related to chronic anticoagulation use.  No increased redness or warmth.   MSK: Knee: Left knee without swelling or effusion.  Full range of motion with 1+ patellofemoral crepitus.  She is tender palpation along the medial joint line.  Negative Murray, negative Lachman, negative varus/valgus stress.  She has no lateral joint line tenderness. Right knee with full range of motion without pain, swelling, or effusion.  No medial or lateral joint line tenderness.  No ligamentous laxity.  Negative McMurray. Gait: Walking without a limp  NEURO: sensation intact to light touch lower extremity bilaterally VASC: pulses 2+ and symmetric DP/PT bilaterally, no edema  IMAGING: XRAYS:  RT KNEE XR 07/23/2021 showing: - no acute abnormality   Assessment & Plan Left knee pain, unspecified chronicity Left medial knee pain with degenerative medial meniscus tear and likely underlying OA -Space has been improving since lumbar epidural yesterday  Plan: 1.  Diagnosis and treatment discussed.  Explained symptoms likely related to underlying OA and degenerative meniscus tear.  Limited MSK ultrasound of the left knee was completed in the office today.  She had trace effusion.  Medial meniscus with slight fragmentation, no extrusion from the joint.  No other abnormalities.  Ultrasound done for academic purposes only, did not affect plan of care 2.  She will continue to use her knee sleeve as she is doing 3.  Can continue to use Voltaren gel as needed 4.  Can use heat or ice as needed 5.  Consider dedicated left knee x-rays if symptoms worsening or not improving.  She has had prior cortisone injections in the right knee, could also consider this if symptoms are worsening 6.  She will follow-up with me on an as-needed basis Degenerative tear of left medial  meniscus Left medial knee pain with degenerative medial meniscus tear and likely underlying OA  Plan:  Limited MSK ultrasound of the left knee was completed in the office today.  She had trace effusion.  Medial meniscus with slight fragmentation, no extrusion from the joint.  No other abnormalities.  Findings  consistent with degenerative medial meniscus tear 2.  She will continue to use her knee sleeve as she is doing 3.  Can continue to use Voltaren gel as needed 4.  Can use heat or ice as needed 5.  Consider dedicated left knee x-rays if symptoms worsening or not improving.  She has had prior cortisone injections in the right knee, could also consider this if symptoms are worsening 6.  She will follow-up with me on an as-needed basis  Encounter Diagnoses  Name Primary?   Left knee pain, unspecified chronicity Yes   Degenerative tear of left medial meniscus     Orders Placed This Encounter  Procedures   [place] LIMITED JOINT SPACE STRUCTURES LOW LEFT    Orders Placed This Encounter  Procedures   [place] LIMITED JOINT SPACE STRUCTURES LOW LEFT

## 2023-06-20 NOTE — Assessment & Plan Note (Signed)
Left medial knee pain with degenerative medial meniscus tear and likely underlying OA  Plan:  Limited MSK ultrasound of the left knee was completed in the office today.  She had trace effusion.  Medial meniscus with slight fragmentation, no extrusion from the joint.  No other abnormalities.  Findings consistent with degenerative medial meniscus tear

## 2023-06-21 DIAGNOSIS — M6281 Muscle weakness (generalized): Secondary | ICD-10-CM | POA: Diagnosis not present

## 2023-06-21 DIAGNOSIS — M25612 Stiffness of left shoulder, not elsewhere classified: Secondary | ICD-10-CM | POA: Diagnosis not present

## 2023-06-21 DIAGNOSIS — M25412 Effusion, left shoulder: Secondary | ICD-10-CM | POA: Diagnosis not present

## 2023-06-21 DIAGNOSIS — M25512 Pain in left shoulder: Secondary | ICD-10-CM | POA: Diagnosis not present

## 2023-06-22 ENCOUNTER — Other Ambulatory Visit: Payer: Self-pay | Admitting: Internal Medicine

## 2023-06-26 DIAGNOSIS — L821 Other seborrheic keratosis: Secondary | ICD-10-CM | POA: Diagnosis not present

## 2023-06-26 DIAGNOSIS — L578 Other skin changes due to chronic exposure to nonionizing radiation: Secondary | ICD-10-CM | POA: Diagnosis not present

## 2023-06-26 DIAGNOSIS — D1809 Hemangioma of other sites: Secondary | ICD-10-CM | POA: Diagnosis not present

## 2023-06-26 DIAGNOSIS — L570 Actinic keratosis: Secondary | ICD-10-CM | POA: Diagnosis not present

## 2023-06-26 DIAGNOSIS — D485 Neoplasm of uncertain behavior of skin: Secondary | ICD-10-CM | POA: Diagnosis not present

## 2023-06-26 DIAGNOSIS — M25551 Pain in right hip: Secondary | ICD-10-CM | POA: Diagnosis not present

## 2023-06-26 DIAGNOSIS — M25651 Stiffness of right hip, not elsewhere classified: Secondary | ICD-10-CM | POA: Diagnosis not present

## 2023-06-26 DIAGNOSIS — D225 Melanocytic nevi of trunk: Secondary | ICD-10-CM | POA: Diagnosis not present

## 2023-06-26 DIAGNOSIS — M6281 Muscle weakness (generalized): Secondary | ICD-10-CM | POA: Diagnosis not present

## 2023-06-26 DIAGNOSIS — M25562 Pain in left knee: Secondary | ICD-10-CM | POA: Diagnosis not present

## 2023-06-28 ENCOUNTER — Encounter: Payer: Self-pay | Admitting: Internal Medicine

## 2023-06-28 ENCOUNTER — Ambulatory Visit: Payer: Medicare Other | Admitting: Internal Medicine

## 2023-06-28 VITALS — BP 118/64 | HR 65 | Temp 98.0°F | Ht 63.0 in | Wt 143.0 lb

## 2023-06-28 DIAGNOSIS — R051 Acute cough: Secondary | ICD-10-CM | POA: Diagnosis not present

## 2023-06-28 DIAGNOSIS — I10 Essential (primary) hypertension: Secondary | ICD-10-CM

## 2023-06-28 DIAGNOSIS — I482 Chronic atrial fibrillation, unspecified: Secondary | ICD-10-CM

## 2023-06-28 DIAGNOSIS — R059 Cough, unspecified: Secondary | ICD-10-CM | POA: Insufficient documentation

## 2023-06-28 DIAGNOSIS — N1832 Chronic kidney disease, stage 3b: Secondary | ICD-10-CM | POA: Diagnosis not present

## 2023-06-28 MED ORDER — HYDROCODONE BIT-HOMATROP MBR 5-1.5 MG/5ML PO SOLN: 5.0000 mL | Freq: Four times a day (QID) | ORAL | 0 refills | Status: AC | PRN

## 2023-06-28 MED ORDER — AZITHROMYCIN 250 MG PO TABS: ORAL_TABLET | ORAL | 1 refills | Status: AC

## 2023-06-28 NOTE — Progress Notes (Unsigned)
Patient ID: Hailey [name], female   DOB: May 24, 1935, 88 y.o.   MRN: [id number]        Chief Complaint: follow up prod cough, htn, afib, ckd3a       HPI:  Hailey [name] is a 88 y.o. female Here with acute onset mild to mod 2-3 days ST, HA, general weakness and malaise, with prod cough greenish sputum, but Pt denies chest pain, increased sob or doe, wheezing, orthopnea, PND, increased LE swelling, palpitations, dizziness or syncope.   Pt denies polydipsia, polyuria, or new focal neuro s/s.    Pt denies recent wt loss, night sweats, loss of appetite, or other constitutional symptoms        Wt Readings from Last 3 Encounters:  06/28/23 143 lb (64.9 kg)  06/20/23 140 lb (63.5 kg)  06/14/23 143 lb (64.9 kg)   BP Readings from Last 3 Encounters:  06/28/23 118/64  06/20/23 110/82  06/19/23 118/81         Past Medical History:  Diagnosis Date   Acute blood loss anemia 12/25/2017   Allergic rhinitis    Anxiety    Arthritis    "my whole spine" (07/01/2017)   Arthritis    Atrial fibrillation (HCC)    Bowel obstruction (HCC)    in [place]   Bradycardia with 41-50 beats per minute 12/27/2017   Cancer (HCC)    Cellulitis of leg, right with large prepatella hematoma and open wounds 12/26/2017   CKD (chronic kidney disease) stage 2, GFR 60-89 ml/min    Colon polyps    Coronary artery disease    10/18 PCI/DES to p/m LCx with cutting balloon to mLcx   Diverticulosis of colon    GERD (gastroesophageal reflux disease)    Hip bursitis 2010   Dr Wyline Mood, Post op seroma   History of colon polyps    HSV (herpes simplex virus) anogenital infection 07/2019   HTN (hypertension)    IBS (irritable bowel syndrome)    constipation predominant - Dr Kinnie Scales   Lichen sclerosus    Osteopenia 11/2016   T score -2.0 FRAX 15%/4.3%   PAC (premature atrial contraction)    Symptomatiic   Renal insufficiency    Right bundle branch block (RBBB) on electrocardiogram (ECG) 12/27/2017   Scoliosis    SVT  (supraventricular tachycardia) (HCC)    brief history   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   biopsy showing vulvar atypia, possible VIN I   Past Surgical History:  Procedure Laterality Date   ANTERIOR AND POSTERIOR VAGINAL REPAIR  01/2002   Hattie Perch 01/23/2011   APPENDECTOMY  1948   CARDIAC CATHETERIZATION  06/26/2017   CORONARY ANGIOPLASTY WITH STENT PLACEMENT  07/01/2017   CORONARY ATHERECTOMY N/A 07/01/2017   Procedure: CORONARY ATHERECTOMY;  Surgeon: Lyn Records, MD;  Location: [facility];  Service: Cardiovascular;  Laterality: N/A;   CORONARY STENT INTERVENTION N/A 07/01/2017   Procedure: CORONARY STENT INTERVENTION;  Surgeon: Lyn Records, MD;  Location: [facility];  Service: Cardiovascular;  Laterality: N/A;   HAMMER TOE SURGERY     HEMORRHOID BANDING     HIP SURGERY Left 04/2009   hip examination under anesthesia followed by greater trochanteric bursectomy; iliotibial band tenotomy/notes 01/20/2011   I & D EXTREMITY Right 01/10/2018   Procedure: IRRIGATION AND DEBRIDEMENT RIGHT KNEE, APPLY WOUND VAC;  Surgeon: Nadara Mustard, MD;  Location: [facility] OR;  Service: Orthopedics;  Laterality: Right;   KNEE BURSECTOMY Right 04/2009   /  notes 01/09/2011   LEFT ATRIAL APPENDAGE OCCLUSION N/A 08/24/2021   Procedure: LEFT ATRIAL APPENDAGE OCCLUSION;  Surgeon: Tonny Bollman, MD;  Location: [facility] INVASIVE CV LAB;  Service: Cardiovascular;  Laterality: N/A;   LEFT HEART CATH AND CORONARY ANGIOGRAPHY N/A 06/26/2017   Procedure: LEFT HEART CATH AND CORONARY ANGIOGRAPHY;  Surgeon: Lyn Records, MD;  Location: [facility];  Service: Cardiovascular;  Laterality: N/A;   PUBOVAGINAL SLING  01/2002   Hattie Perch 01/23/2011   REDUCTION MAMMAPLASTY     TEE WITHOUT CARDIOVERSION N/A 08/24/2021   Procedure: TRANSESOPHAGEAL ECHOCARDIOGRAM (TEE);  Surgeon: Tonny Bollman, MD;  Location: [facility] INVASIVE CV LAB;  Service: Cardiovascular;  Laterality: N/A;   TEMPORARY PACEMAKER N/A  07/01/2017   Procedure: TEMPORARY PACEMAKER;  Surgeon: Lyn Records, MD;  Location: [facility] INVASIVE CV LAB;  Service: Cardiovascular;  Laterality: N/A;   VAGINAL HYSTERECTOMY  01/2002   Vaginal hysterectomy, bilateral salpingo-oophorectomy/notes 01/23/2011    reports that she has never smoked. She has never used smokeless tobacco. She reports that she does not drink alcohol and does not use drugs. family history includes Colon cancer in her mother; Diabetes in her father; Pancreatic cancer in her brother; Prostate cancer in her brother and father; Stomach cancer in her son. Allergies  Allergen Reactions   Macrobid [Nitrofurantoin Monohyd Macro] Other (See Comments)    Nausea, stomach cramps, fatigue , headache.   Meloxicam Other (See Comments)    Jittery and headache   Digoxin And Related Other (See Comments)    headaches   Diprolene [Betamethasone Dipropionate Aug] Other (See Comments)    Patient states that the bruising was made worse when using this   Ferrous Sulfate Other (See Comments)    Bad constipation    Keflex [Cephalexin] Nausea And Vomiting    Nausea, fatigue, and headache.   Lipitor [Atorvastatin]     Abd pain   Mobic [Meloxicam] Other (See Comments)    Unsure of reaction type   Sulfamethoxazole-Trimethoprim Nausea Only   Current Outpatient Medications on File Prior to Visit  Medication Sig Dispense Refill   acetaminophen (TYLENOL) 500 MG tablet Take 1,000 mg by mouth every 6 (six) hours as needed (body aches / sore hip). Maximum of 6 tablets daily (3000mg )     apixaban (ELIQUIS) 2.5 MG TABS tablet Take 2.5 mg by mouth 2 (two) times daily.     BIOTIN PO Take 5,000 mcg by mouth in the morning.     clobetasol ointment (TEMOVATE) 0.05 % Apply to the affected area in a thin layer twice a day for 2 weeks as needed for a flare.  Then apply to the area twice a week at bedtime as maintenance dosing. 60 g 1   diclofenac Sodium (VOLTAREN) 1 % GEL Apply 2 g topically 4 (four) times  daily as needed (back pain.).     dicyclomine (BENTYL) 20 MG tablet Take 1 by mouth every 4-6 hours as needed for cramping 30 tablet 6   empagliflozin (JARDIANCE) 10 MG TABS tablet Take 1 tablet (10 mg total) by mouth daily before breakfast. 90 tablet 2   escitalopram (LEXAPRO) 5 MG tablet Take 1 tablet (5 mg total) by mouth every evening. 30 tablet 5   esomeprazole (NEXIUM) 40 MG capsule TAKE ONE CAPSULE ONCE DAILY 90 capsule 1   GABAPENTIN EX Apply 1 Application topically 3 (three) times daily as needed (To vulva). Medication: Gabapentin 6% cream     Homeopathic Products (ARNICARE) GEL Apply 1 application  topically daily  as needed (skin bruising).     hyoscyamine (LEVSIN) 0.125 MG tablet TAKE 1-2 TABLETS EVERY 4 HOURS AS NEEDED FOR UP TO 10 DAYS FOR CRAMPING. 100 tablet 3   lidocaine (XYLOCAINE) 5 % ointment Apply 1 Application topically 3 (three) times daily. Uses as needed three times a day for pain of the vulva. 1.25 g 1   LORazepam (ATIVAN) 1 MG tablet TAKE ONE TABLET BY MOUTH TWICE DAILY AS NEEDED FOR ANXIETY / SLEEP. 180 tablet 1   losartan (COZAAR) 25 MG tablet Take 1 tablet (25 mg total) by mouth daily. 90 tablet 3   meclizine (ANTIVERT) 12.5 MG tablet Take 1 tablet (12.5 mg total) by mouth 3 (three) times daily as needed for dizziness. 60 tablet 1   nitroGLYCERIN (NITROSTAT) 0.4 MG SL tablet Place 1 tablet (0.4 mg total) under the tongue every 5 (five) minutes as needed for chest pain (Call 911 at 3rd dose within 15 minutes.). 20 tablet 3   ondansetron (ZOFRAN-ODT) 8 MG disintegrating tablet Take 1 tablet (8 mg total) by mouth every 8 (eight) hours as needed for nausea or vomiting. 12 tablet 3   Polyethyl Glycol-Propyl Glycol (LUBRICANT EYE DROPS) 0.4-0.3 % SOLN Place 1-2 drops into both eyes at bedtime.     polyethylene glycol (MIRALAX / GLYCOLAX) 17 g packet Take 17 g by mouth as needed for mild constipation or moderate constipation.     sodium chloride (OCEAN) 0.65 % SOLN nasal spray  Place 1 spray into both nostrils at bedtime as needed for congestion.     Current Facility-Administered Medications on File Prior to Visit  Medication Dose Route Frequency Provider Last Rate Last Admin   methylPREDNISolone acetate (DEPO-MEDROL) injection 40 mg  40 mg Other Once             ROS:  All others reviewed and negative.  Objective        PE:  BP 118/64 (BP Location: Right Arm, Patient Position: Sitting, Cuff Size: Normal)   Pulse 65   Temp 98 F (36.7 C) (Oral)   Ht 5\' 3"  (1.6 m)   Wt 143 lb (64.9 kg)   SpO2 97%   BMI 25.33 kg/m                 Constitutional: Pt appears in NAD, mild ill               HENT: Head: NCAT.                Right Ear: External ear normal.                 Left Ear: External ear normal. Bilat tm's with mild erythema.  Max sinus areas non tender.  Pharynx with mild erythema, no exudate               Eyes: . Pupils are equal, round, and reactive to light. Conjunctivae and EOM are normal               Nose: without d/c or deformity               Neck: Neck supple. Gross normal ROM               Cardiovascular: Normal rate and regular rhythm.                 Pulmonary/Chest: Effort normal and breath sounds without rales or wheezing.  Neurological: Pt is alert. At baseline orientation, motor grossly intact               Skin: Skin is warm. No rashes, no other new lesions, LE edema - none               Psychiatric: Pt behavior is normal without agitation   Micro: none  Cardiac tracings I have personally interpreted today:  none  Pertinent Radiological findings (summarize): none   Lab Results  Component Value Date   WBC 7.0 02/18/2023   HGB 11.0 (L) 02/18/2023   HCT 33.3 (L) 02/18/2023   PLT 381.0 02/18/2023   GLUCOSE 91 02/18/2023   CHOL 145 02/18/2023   TRIG 125.0 02/18/2023   HDL 63.60 02/18/2023   LDLDIRECT 89.1 06/29/2013   LDLCALC 57 02/18/2023   ALT 12 02/07/2023   AST 22 02/07/2023   NA 137  02/18/2023   K 4.3 02/18/2023   CL 103 02/18/2023   CREATININE 1.20 (H) 05/16/2023   BUN 20 02/18/2023   CO2 25 02/18/2023   TSH 1.685 02/07/2023   INR 1.2 02/07/2023   HGBA1C 5.5 02/08/2023   Assessment/Plan:  Hailey [name] is a 88 y.o. White or Caucasian [1] female with  has a past medical history of Acute blood loss anemia (12/25/2017), Allergic rhinitis, Anxiety, Arthritis, Arthritis, Atrial fibrillation (HCC), Bowel obstruction (HCC), Bradycardia with 41-50 beats per minute (12/27/2017), Cancer (HCC), Cellulitis of leg, right with large prepatella hematoma and open wounds (12/26/2017), CKD (chronic kidney disease) stage 2, GFR 60-89 ml/min, Colon polyps, Coronary artery disease, Diverticulosis of colon, GERD (gastroesophageal reflux disease), Hip bursitis (2010), History of colon polyps, HSV (herpes simplex virus) anogenital infection (07/2019), HTN (hypertension), IBS (irritable bowel syndrome), Lichen sclerosus, Osteopenia (11/2016), PAC (premature atrial contraction), Renal insufficiency, Right bundle branch block (RBBB) on electrocardiogram (ECG) (12/27/2017), Scoliosis, SVT (supraventricular tachycardia) (HCC), and VIN I (vulvar intraepithelial neoplasia I) (05/2021).  Chronic a-fib (HCC) Stable volume and rate, cont current med tx  Essential hypertension BP Readings from Last 3 Encounters:  06/28/23 118/64  06/20/23 110/82  06/19/23 118/81   Stable, pt to continue medical treatment losartan 25 mg qd   Cough Mild to mod, c/w bronchitis vs pna, decilnes cxr, for antibx course zpack, and cough med prn,  to f/u any worsening symptoms or concerns  Chronic kidney disease, stage 3b (HCC) Lab Results  Component Value Date   CREATININE 1.20 (H) 05/16/2023   Stable overall, cont to avoid nephrotoxins  Followup: Return if symptoms worsen or fail to improve.  Oliver Barre, MD 07/02/2023 7:21 AM [facility]

## 2023-06-28 NOTE — Patient Instructions (Signed)
Please take all new medication as prescribed - the antibiotic, and cough medicine as needed  Please continue all other medications as before, and refills have been done if requested.  Please have the pharmacy call with any other refills you may need.  Please continue your efforts at being more active, low cholesterol diet, and weight control.  Please keep your appointments with your specialists as you may have planned   

## 2023-07-02 ENCOUNTER — Telehealth: Payer: Self-pay | Admitting: Internal Medicine

## 2023-07-02 ENCOUNTER — Encounter: Payer: Self-pay | Admitting: Internal Medicine

## 2023-07-02 NOTE — Assessment & Plan Note (Signed)
Stable volume and rate, cont current med tx

## 2023-07-02 NOTE — Assessment & Plan Note (Signed)
Mild to mod, c/w bronchitis vs pna, decilnes cxr, for antibx course zpack, and cough med prn,  to f/u any worsening symptoms or concerns

## 2023-07-02 NOTE — Telephone Encounter (Signed)
Pt called and mentioned that she saw Dr Jonny Ruiz (Friday), and was prescribed some cough medication (Tablets) -- Unknown name.. She's down to her last one and feels like that medication was a waste of time and that its making her sicker that she refuses to take the last pill.  Pt she still has the cough she came into her appointment with. However, pt wants to know if there is any other type of medication she can get prescribed with or should she set another appointment up with her PCP. Please advise. Thanks

## 2023-07-02 NOTE — Assessment & Plan Note (Signed)
Lab Results  Component Value Date   CREATININE 1.20 (H) 05/16/2023   Stable overall, cont to avoid nephrotoxins

## 2023-07-02 NOTE — Assessment & Plan Note (Signed)
BP Readings from Last 3 Encounters:  06/28/23 118/64  06/20/23 110/82  06/19/23 118/81   Stable, pt to continue medical treatment losartan 25 mg qd

## 2023-07-03 ENCOUNTER — Ambulatory Visit: Payer: Medicare Other | Admitting: Physical Medicine and Rehabilitation

## 2023-07-03 DIAGNOSIS — M6281 Muscle weakness (generalized): Secondary | ICD-10-CM | POA: Diagnosis not present

## 2023-07-03 DIAGNOSIS — M25551 Pain in right hip: Secondary | ICD-10-CM | POA: Diagnosis not present

## 2023-07-03 DIAGNOSIS — M25651 Stiffness of right hip, not elsewhere classified: Secondary | ICD-10-CM | POA: Diagnosis not present

## 2023-07-03 DIAGNOSIS — M25562 Pain in left knee: Secondary | ICD-10-CM | POA: Diagnosis not present

## 2023-07-03 NOTE — Telephone Encounter (Signed)
Zithromax, the antibiotic tablets, that the patient was given would last in her system for 10 days total and continue to work. She was given a cough syrup with hydrocodone which should help with cough. Use over-the-counter cough syrup, cough drops. Please see me if not better. Thank you

## 2023-07-04 NOTE — Telephone Encounter (Signed)
Pt has stated she is getting better.

## 2023-07-05 ENCOUNTER — Telehealth: Payer: Self-pay | Admitting: Internal Medicine

## 2023-07-05 DIAGNOSIS — M25651 Stiffness of right hip, not elsewhere classified: Secondary | ICD-10-CM | POA: Diagnosis not present

## 2023-07-05 DIAGNOSIS — M25551 Pain in right hip: Secondary | ICD-10-CM | POA: Diagnosis not present

## 2023-07-05 DIAGNOSIS — M25562 Pain in left knee: Secondary | ICD-10-CM | POA: Diagnosis not present

## 2023-07-05 DIAGNOSIS — M6281 Muscle weakness (generalized): Secondary | ICD-10-CM | POA: Diagnosis not present

## 2023-07-05 NOTE — Telephone Encounter (Signed)
Pt called stating she came in office on the 18th feeling bad and pt ended up canceling her trip due to her illness and she is still having issues. Pt wanted to know if she can get a Doctors note so she can be able to get her refund from her trip that she canceled. Please advise call if you have anymore questions or concerns.  Also wanted add pt had an allergic reaction to the antibiotic.   Best call back [phone]

## 2023-07-08 DIAGNOSIS — M25562 Pain in left knee: Secondary | ICD-10-CM | POA: Diagnosis not present

## 2023-07-08 DIAGNOSIS — M6281 Muscle weakness (generalized): Secondary | ICD-10-CM | POA: Diagnosis not present

## 2023-07-08 DIAGNOSIS — M25551 Pain in right hip: Secondary | ICD-10-CM | POA: Diagnosis not present

## 2023-07-08 DIAGNOSIS — M25651 Stiffness of right hip, not elsewhere classified: Secondary | ICD-10-CM | POA: Diagnosis not present

## 2023-07-09 ENCOUNTER — Encounter: Payer: Self-pay | Admitting: Internal Medicine

## 2023-07-10 NOTE — Telephone Encounter (Signed)
Patient called back to check on the status of her request. She would like a call back at [phone].

## 2023-07-10 NOTE — Telephone Encounter (Signed)
Awaiting provider's approval for letter to be written for pt.

## 2023-07-11 DIAGNOSIS — M25651 Stiffness of right hip, not elsewhere classified: Secondary | ICD-10-CM | POA: Diagnosis not present

## 2023-07-11 DIAGNOSIS — M6281 Muscle weakness (generalized): Secondary | ICD-10-CM | POA: Diagnosis not present

## 2023-07-11 DIAGNOSIS — M25551 Pain in right hip: Secondary | ICD-10-CM | POA: Diagnosis not present

## 2023-07-11 DIAGNOSIS — M25562 Pain in left knee: Secondary | ICD-10-CM | POA: Diagnosis not present

## 2023-07-11 NOTE — Telephone Encounter (Signed)
I was able to send pt the letter needed via MyChart.

## 2023-07-11 NOTE — Telephone Encounter (Signed)
[name], please write a letter.  Thank you

## 2023-07-12 NOTE — Progress Notes (Signed)
Hailey [name] - 88 y.o. female MRN [id number]  Date of birth: 1935-04-25  Office Visit Note: Visit Date: 06/19/2023 PCP: Tresa Garter, MD Referred by: Tresa Garter, MD  Subjective: Chief Complaint  Patient presents with   Lower Back - Pain   HPI:  Hailey [name] is a 88 y.o. female who comes in today for planned repeat Bilateral L4-5  Lumbar Transforaminal epidural steroid injection with fluoroscopic guidance.  The patient has failed conservative care including home exercise, medications, time and activity modification.  This injection will be diagnostic and hopefully therapeutic.  Please see requesting physician notes for further details and justification. Patient received more than 50% pain relief from prior injection.   Referring: Ellin Goodie, FNP   ROS Otherwise per HPI.  Assessment & Plan: Visit Diagnoses:    ICD-10-CM   1. Lumbar radiculopathy  M54.16 XR C-ARM NO REPORT    Epidural Steroid injection    methylPREDNISolone acetate (DEPO-MEDROL) injection 40 mg      Plan: No additional findings.   Meds & Orders:  Meds ordered this encounter  Medications   methylPREDNISolone acetate (DEPO-MEDROL) injection 40 mg    Orders Placed This Encounter  Procedures   XR C-ARM NO REPORT   Epidural Steroid injection    Follow-up: Return for visit to requesting provider as needed.   Procedures: No procedures performed  Lumbosacral Transforaminal Epidural Steroid Injection - Sub-Pedicular Approach with Fluoroscopic Guidance  Patient: Hailey [name]      Date of Birth: Feb 21, 1935 MRN: [id number] PCP: Tresa Garter, MD      Visit Date: 06/19/2023   Universal Protocol:    Date/Time: 06/19/2023  Consent Given By: the patient  Position: PRONE  Additional Comments: Vital signs were monitored before and after the procedure. Patient was prepped and draped in the usual sterile fashion. The correct patient, procedure, and site was  verified.   Injection Procedure Details:   Procedure diagnoses: Lumbar radiculopathy [M54.16]    Meds Administered:  Meds ordered this encounter  Medications   methylPREDNISolone acetate (DEPO-MEDROL) injection 40 mg    Laterality: Bilateral  Location/Site: L4  Needle:5.0 in., 22 ga.  Short bevel or Quincke spinal needle  Needle Placement: Transforaminal  Findings:    -Comments: Excellent flow of contrast along the nerve, nerve root and into the epidural space.  Procedure Details: After squaring off the end-plates to get a true AP view, the C-arm was positioned so that an oblique view of the foramen as noted above was visualized. The target area is just inferior to the "nose of the scotty dog" or sub pedicular. The soft tissues overlying this structure were infiltrated with 2-3 ml. of 1% Lidocaine without Epinephrine.  The spinal needle was inserted toward the target using a "trajectory" view along the fluoroscope beam.  Under AP and lateral visualization, the needle was advanced so it did not puncture dura and was located close the 6 O'Clock position of the pedical in AP tracterory. Biplanar projections were used to confirm position. Aspiration was confirmed to be negative for CSF and/or blood. A 1-2 ml. volume of Isovue-250 was injected and flow of contrast was noted at each level. Radiographs were obtained for documentation purposes.   After attaining the desired flow of contrast documented above, a 0.5 to 1.0 ml test dose of 0.25% Marcaine was injected into each respective transforaminal space.  The patient was observed for 90 seconds post injection.  After no sensory deficits were reported, and normal  lower extremity motor function was noted,   the above injectate was administered so that equal amounts of the injectate were placed at each foramen (level) into the transforaminal epidural space.   Additional Comments:  No complications occurred Dressing: 2 x 2 sterile gauze and  Band-Aid    Post-procedure details: Patient was observed during the procedure. Post-procedure instructions were reviewed.  Patient left the clinic in stable condition.    Clinical History: EXAM: MRI LUMBAR SPINE WITHOUT CONTRAST   TECHNIQUE: Multiplanar, multisequence MR imaging of the lumbar spine was performed. No intravenous contrast was administered.   COMPARISON:  MR lumbar 03/20/2017; X-ray lumbar 09/14/2021.   FINDINGS: Segmentation:  Standard.   Alignment: Scoliotic thoracolumbar curvature. Mild retrolisthesis at L1-2, L2-3, and L3-4.   Vertebrae: No fracture, evidence of discitis, or suspicious bone lesion. Scattered intraosseous hemangiomas.   Conus medullaris and cauda equina: Conus extends to the L1 level. Conus and cauda equina appear normal.   Paraspinal and other soft tissues: No acute findings.   Disc levels:   T12-L1: Mild disc bulge. No foraminal or canal stenosis. Unchanged.   L1-L2: Disc height loss with mild disc bulge and endplate ridging. Mild left foraminal stenosis. No canal stenosis. Unchanged.   L2-L3: Retrolisthesis. Diffuse disc bulge with small caudally extending right paracentral disc extrusion. Mild bilateral facet arthropathy. Mild canal stenosis with moderate bilateral foraminal stenosis. Findings have progressed from prior.   L3-L4: Diffuse disc bulge with mild bilateral facet arthropathy and ligamentum flavum buckling. Findings result in mild-moderate canal stenosis with moderate right foraminal stenosis. Findings progressed from prior.   L4-L5: Disc bulge with endplate osteophytic ridging. Advanced bilateral facet arthropathy. Severe right and mild left foraminal stenosis. No significant interval progression.   L5-S1: Disc height loss and endplate ridging. Mild facet hypertrophy. No canal stenosis. Mild-to-moderate left and mild right foraminal stenosis. Unchanged.   IMPRESSION: 1. Multilevel lumbar spondylosis, slightly  progressed from prior MRI. 2. Mild-moderate canal stenosis with moderate right foraminal stenosis at L3-4. 3. Mild canal stenosis and moderate bilateral foraminal stenosis at L2-3. 4. Severe right and mild left foraminal stenosis at L4-5.     Electronically Signed   By: Duanne Guess D.O.   On: 11/29/2021 13:23     Objective:  VS:  HT:    WT:   BMI:     BP:118/81  HR:86bpm  TEMP: ( )  RESP:  Physical Exam Vitals and nursing note reviewed.  Constitutional:      General: She is not in acute distress.    Appearance: Normal appearance. She is not ill-appearing.  HENT:     Head: Normocephalic and atraumatic.     Right Ear: External ear normal.     Left Ear: External ear normal.  Eyes:     Extraocular Movements: Extraocular movements intact.  Cardiovascular:     Rate and Rhythm: Normal rate.     Pulses: Normal pulses.  Pulmonary:     Effort: Pulmonary effort is normal. No respiratory distress.  Abdominal:     General: There is no distension.     Palpations: Abdomen is soft.  Musculoskeletal:        General: Tenderness present.     Cervical back: Neck supple.     Right lower leg: No edema.     Left lower leg: No edema.     Comments: Patient has good distal strength with no pain over the greater trochanters.  No clonus or focal weakness.  Skin:    Findings:  No erythema, lesion or rash.  Neurological:     General: No focal deficit present.     Mental Status: She is alert and oriented to person, place, and time.     Sensory: No sensory deficit.     Motor: No weakness or abnormal muscle tone.     Coordination: Coordination normal.  Psychiatric:        Mood and Affect: Mood normal.        Behavior: Behavior normal.      Imaging: No results found.

## 2023-07-12 NOTE — Procedures (Signed)
Lumbosacral Transforaminal Epidural Steroid Injection - Sub-Pedicular Approach with Fluoroscopic Guidance  Patient: Hailey [name]      Date of Birth: [birth year] MRN: [id number] PCP: Tresa Garter, MD      Visit Date: 06/19/2023   Universal Protocol:    Date/Time: 06/19/2023  Consent Given By: the patient  Position: PRONE  Additional Comments: Vital signs were monitored before and after the procedure. Patient was prepped and draped in the usual sterile fashion. The correct patient, procedure, and site was verified.   Injection Procedure Details:   Procedure diagnoses: Lumbar radiculopathy [M54.16]    Meds Administered:  Meds ordered this encounter  Medications   methylPREDNISolone acetate (DEPO-MEDROL) injection 40 mg    Laterality: Bilateral  Location/Site: L4  Needle:5.0 in., 22 ga.  Short bevel or Quincke spinal needle  Needle Placement: Transforaminal  Findings:    -Comments: Excellent flow of contrast along the nerve, nerve root and into the epidural space.  Procedure Details: After squaring off the end-plates to get a true AP view, the C-arm was positioned so that an oblique view of the foramen as noted above was visualized. The target area is just inferior to the "nose of the scotty dog" or sub pedicular. The soft tissues overlying this structure were infiltrated with 2-3 ml. of 1% Lidocaine without Epinephrine.  The spinal needle was inserted toward the target using a "trajectory" view along the fluoroscope beam.  Under AP and lateral visualization, the needle was advanced so it did not puncture dura and was located close the 6 O'Clock position of the pedical in AP tracterory. Biplanar projections were used to confirm position. Aspiration was confirmed to be negative for CSF and/or blood. A 1-2 ml. volume of Isovue-250 was injected and flow of contrast was noted at each level. Radiographs were obtained for documentation purposes.   After attaining the  desired flow of contrast documented above, a 0.5 to 1.0 ml test dose of 0.25% Marcaine was injected into each respective transforaminal space.  The patient was observed for 90 seconds post injection.  After no sensory deficits were reported, and normal lower extremity motor function was noted,   the above injectate was administered so that equal amounts of the injectate were placed at each foramen (level) into the transforaminal epidural space.   Additional Comments:  No complications occurred Dressing: 2 x 2 sterile gauze and Band-Aid    Post-procedure details: Patient was observed during the procedure. Post-procedure instructions were reviewed.  Patient left the clinic in stable condition.

## 2023-07-15 ENCOUNTER — Other Ambulatory Visit: Payer: Self-pay

## 2023-07-15 NOTE — Progress Notes (Deleted)
GYNECOLOGY  VISIT   HPI: 88 y.o.   Married  Caucasian female   G2P2000 with No LMP recorded. Patient has had a hysterectomy.   here for: 2 mo f/u     GYNECOLOGIC HISTORY: No LMP recorded. Patient has had a hysterectomy. Contraception:  hyst Menopausal hormone therapy:  n/a Pap:   No results found for: "DIAGPAP", "HPVHIGH", "ADEQPAP" History of abnormal Pap or positive HPV:  no Mammogram:  05/30/23 Breast Density Cat C, BI-RADS CAT 1 neg        OB History     Gravida  2   Para  2   Term  2   Preterm  0   AB  0   Living         SAB  0   IAB  0   Ectopic  0   Multiple      Live Births                 Patient Active Problem List   Diagnosis Date Noted   Cough 06/28/2023   Degenerative tear of left medial meniscus 06/20/2023   History of skin cancer 05/20/2023   Melanocytic nevi of trunk 05/20/2023   Seborrheic keratoses 05/20/2023   Squamous cell carcinoma of skin of right lower extremity 05/20/2023   Mitral regurgitation 02/22/2023   Aneurysm of ascending aorta without rupture (HCC) 02/18/2023   Cellulitis of left leg 02/18/2023   Closed comminuted fracture of left humerus 02/07/2023   Bradycardia 02/07/2023   Fall 02/07/2023   Chronic a-fib (HCC) 02/07/2023   Statin intolerance 12/31/2022   Acute pain of right knee 12/06/2022   Impetigo 10/01/2022   Insomnia 10/01/2022   Osteoporosis 06/11/2022   Weakness 04/25/2022   Weakness generalized 04/25/2022   Falls frequently 04/19/2022   Iron deficiency anemia 12/08/2021   Skin ulcer, limited to breakdown of skin (HCC) 11/22/2021   Anemia, iron deficiency 11/22/2021   Low serum vitamin B12 11/22/2021   Acute renal failure superimposed on stage 2 chronic kidney disease (HCC) 11/20/2021   ABLA (acute blood loss anemia) 11/20/2021   Hypotension 11/19/2021   Shortness of breath 06/12/2021   Right leg pain 02/21/2021   Abnormal CT of the abdomen    Aortic atherosclerosis (HCC) 08/27/2020   Acute  prerenal azotemia 08/27/2020   Shock circulatory (HCC) 08/27/2020   Metabolic acidosis with normal anion gap and bicarbonate losses 08/27/2020   Abdominal cramps 03/16/2020   Neurogenic orthostatic hypotension (HCC) 09/16/2019   Greater trochanteric pain syndrome of right lower extremity 07/30/2019   Abdominal pain 05/22/2018   Bradycardia with 41-50 beats per minute 12/27/2017   Right bundle branch block (RBBB) on electrocardiogram (ECG) 12/27/2017   Chronic kidney disease, stage 3b (HCC) 12/24/2017   Cellulitis of leg, right with large prepatella hematoma and open wounds 12/24/2017   Iliotibial band syndrome of right side 12/16/2017   Intervertebral lumbar disc disorder with myelopathy, lumbar region 04/25/2017   Family history of colon cancer in mother 01/22/2017   Primary osteoarthritis of both first carpometacarpal joints 01/02/2017   Pain 11/21/2016   Gastroenteritis 09/13/2016   Chronic anticoagulation 05/24/2015   Coronary artery disease involving native coronary artery of native heart with angina pectoris (HCC) 12/20/2014   Permanent atrial fibrillation (HCC) 11/21/2012   Greater trochanteric bursitis of both hips 05/21/2012   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   Bruising 12/18/2010   Osteoarthritis of left hip 07/03/2010   DEGENERATIVE DISC DISEASE, LUMBOSACRAL SPINE 05/18/2010  SYNCOPE 10/27/2008   IBS 05/21/2008   Essential hypertension 06/16/2007   GERD (gastroesophageal reflux disease) 06/16/2007    Past Medical History:  Diagnosis Date   Acute blood loss anemia 12/25/2017   Allergic rhinitis    Anxiety    Arthritis    "my whole spine" (07/01/2017)   Arthritis    Atrial fibrillation (HCC)    Bowel obstruction (HCC)    in [place]   Bradycardia with 41-50 beats per minute 12/27/2017   Cancer (HCC)    Cellulitis of leg, right with large prepatella hematoma and open wounds 12/26/2017   CKD (chronic kidney disease) stage 2, GFR 60-89 ml/min    Colon polyps     Coronary artery disease    10/18 PCI/DES to p/m LCx with cutting balloon to mLcx   Diverticulosis of colon    GERD (gastroesophageal reflux disease)    Hip bursitis 2010   Dr Wyline Mood, Post op seroma   History of colon polyps    HSV (herpes simplex virus) anogenital infection 07/2019   HTN (hypertension)    IBS (irritable bowel syndrome)    constipation predominant - Dr Kinnie Scales   Lichen sclerosus    Osteopenia 11/2016   T score -2.0 FRAX 15%/4.3%   PAC (premature atrial contraction)    Symptomatiic   Renal insufficiency    Right bundle branch block (RBBB) on electrocardiogram (ECG) 12/27/2017   Scoliosis    SVT (supraventricular tachycardia) (HCC)    brief history   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   biopsy showing vulvar atypia, possible VIN I    Past Surgical History:  Procedure Laterality Date   ANTERIOR AND POSTERIOR VAGINAL REPAIR  01/2002   Hattie Perch 01/23/2011   APPENDECTOMY  1946   CARDIAC CATHETERIZATION  06/26/2017   CORONARY ANGIOPLASTY WITH STENT PLACEMENT  07/01/2017   CORONARY ATHERECTOMY N/A 07/01/2017   Procedure: CORONARY ATHERECTOMY;  Surgeon: Lyn Records, MD;  Location: [facility];  Service: Cardiovascular;  Laterality: N/A;   CORONARY STENT INTERVENTION N/A 07/01/2017   Procedure: CORONARY STENT INTERVENTION;  Surgeon: Lyn Records, MD;  Location: [facility];  Service: Cardiovascular;  Laterality: N/A;   HAMMER TOE SURGERY     HEMORRHOID BANDING     HIP SURGERY Left 04/2009   hip examination under anesthesia followed by greater trochanteric bursectomy; iliotibial band tenotomy/notes 01/20/2011   I & D EXTREMITY Right 01/10/2018   Procedure: IRRIGATION AND DEBRIDEMENT RIGHT KNEE, APPLY WOUND VAC;  Surgeon: Nadara Mustard, MD;  Location: [facility] OR;  Service: Orthopedics;  Laterality: Right;   KNEE BURSECTOMY Right 04/2009   Hattie Perch 01/09/2011   LEFT ATRIAL APPENDAGE OCCLUSION N/A 08/24/2021   Procedure: LEFT ATRIAL APPENDAGE  OCCLUSION;  Surgeon: Tonny Bollman, MD;  Location: [facility] INVAS[facility];  Service: Cardiovascular;  Laterality: N/A;   LEFT HEART CATH AND CORONARY ANGIOGRAPHY N/A 06/26/2017   Procedure: LEFT HEART CATH AND CORONARY ANGIOGRAPHY;  Surgeon: Lyn Records, MD;  Location: [facility];  Service: Cardiovascular;  Laterality: N/A;   PUBOVAGINAL SLING  01/2002   Hattie Perch 01/23/2011   REDUCTION MAMMAPLASTY     TEE WITHOUT CARDIOVERSION N/A 08/24/2021   Procedure: TRANSESOPHAGEAL ECHOCARDIOGRAM (TEE);  Surgeon: Tonny Bollman, MD;  Location: [facility] INVASIVE CV LAB;  Service: Cardiovascular;  Laterality: N/A;   TEMPORARY PACEMAKER N/A 07/01/2017   Procedure: TEMPORARY PACEMAKER;  Surgeon: Lyn Records, MD;  Location: [facility] INVASIVE CV LAB;  Service: Cardiovascular;  Laterality: N/A;  VAGINAL HYSTERECTOMY  01/2002   Vaginal hysterectomy, bilateral salpingo-oophorectomy/notes 01/23/2011    Current Outpatient Medications  Medication Sig Dispense Refill   acetaminophen (TYLENOL) 500 MG tablet Take 1,000 mg by mouth every 6 (six) hours as needed (body aches / sore hip). Maximum of 6 tablets daily (3000mg )     apixaban (ELIQUIS) 2.5 MG TABS tablet Take 2.5 mg by mouth 2 (two) times daily.     BIOTIN PO Take 5,000 mcg by mouth in the morning.     clobetasol ointment (TEMOVATE) 0.05 % Apply to the affected area in a thin layer twice a day for 2 weeks as needed for a flare.  Then apply to the area twice a week at bedtime as maintenance dosing. 60 g 1   diclofenac Sodium (VOLTAREN) 1 % GEL Apply 2 g topically 4 (four) times daily as needed (back pain.).     dicyclomine (BENTYL) 20 MG tablet Take 1 by mouth every 4-6 hours as needed for cramping 30 tablet 6   empagliflozin (JARDIANCE) 10 MG TABS tablet Take 1 tablet (10 mg total) by mouth daily before breakfast. 90 tablet 2   escitalopram (LEXAPRO) 5 MG tablet Take 1 tablet (5 mg total) by mouth every evening. 30 tablet 5   esomeprazole (NEXIUM) 40 MG capsule  TAKE ONE CAPSULE ONCE DAILY 90 capsule 1   GABAPENTIN EX Apply 1 Application topically 3 (three) times daily as needed (To vulva). Medication: Gabapentin 6% cream     Homeopathic Products (ARNICARE) GEL Apply 1 application  topically daily as needed (skin bruising).     hyoscyamine (LEVSIN) 0.125 MG tablet TAKE 1-2 TABLETS EVERY 4 HOURS AS NEEDED FOR UP TO 10 DAYS FOR CRAMPING. 100 tablet 3   lidocaine (XYLOCAINE) 5 % ointment Apply 1 Application topically 3 (three) times daily. Uses as needed three times a day for pain of the vulva. 1.25 g 1   LORazepam (ATIVAN) 1 MG tablet TAKE ONE TABLET BY MOUTH TWICE DAILY AS NEEDED FOR ANXIETY / SLEEP. 180 tablet 1   losartan (COZAAR) 25 MG tablet Take 1 tablet (25 mg total) by mouth daily. 90 tablet 3   meclizine (ANTIVERT) 12.5 MG tablet Take 1 tablet (12.5 mg total) by mouth 3 (three) times daily as needed for dizziness. 60 tablet 1   nitroGLYCERIN (NITROSTAT) 0.4 MG SL tablet Place 1 tablet (0.4 mg total) under the tongue every 5 (five) minutes as needed for chest pain (Call 911 at 3rd dose within 15 minutes.). 20 tablet 3   ondansetron (ZOFRAN-ODT) 8 MG disintegrating tablet Take 1 tablet (8 mg total) by mouth every 8 (eight) hours as needed for nausea or vomiting. 12 tablet 3   Polyethyl Glycol-Propyl Glycol (LUBRICANT EYE DROPS) 0.4-0.3 % SOLN Place 1-2 drops into both eyes at bedtime.     polyethylene glycol (MIRALAX / GLYCOLAX) 17 g packet Take 17 g by mouth as needed for mild constipation or moderate constipation.     sodium chloride (OCEAN) 0.65 % SOLN nasal spray Place 1 spray into both nostrils at bedtime as needed for congestion.     No current facility-administered medications for this visit.     ALLERGIES: Macrobid [nitrofurantoin monohyd macro], Meloxicam, Digoxin and related, Diprolene [betamethasone dipropionate aug], Ferrous sulfate, Keflex [cephalexin], Lipitor [atorvastatin], Mobic [meloxicam], and Sulfamethoxazole-trimethoprim  Family  History  Problem Relation Age of Onset   Colon cancer Mother        Dx age 53, died at age 41   Diabetes Father  Prostate cancer Father    Prostate cancer Brother    Pancreatic cancer Brother    Stomach cancer Son     Social History   Socioeconomic History   Marital status: Married    Spouse name: [name]   Number of children: 2   Years of education: Not on file   Highest education level: Bachelor's degree (e.g., BA, AB, BS)  Occupational History   Occupation: Engineer, manufacturing systems: RETIRED  Tobacco Use   Smoking status: Never   Smokeless tobacco: Never  Vaping Use   Vaping status: Never Used  Substance and Sexual Activity   Alcohol use: Never    Comment: 7 vodka drinks a week   Drug use: Never   Sexual activity: Not Currently    Birth control/protection: Surgical    Comment: hysterectomy; less than 5, IC after 18, no STD, no abnormal paps  Other Topics Concern   Not on file  Social History Narrative   ** Merged History Encounter **       Regular Exercise -  YES         Social Determinants of Health   Financial Resource Strain: Low Risk  (02/26/2023)   Overall Financial Resource Strain (CARDIA)    Difficulty of Paying Living Expenses: Not hard at all  Food Insecurity: No Food Insecurity (02/26/2023)   Hunger Vital Sign    Worried About Running Out of Food in the Last Year: Never true    Ran Out of Food in the Last Year: Never true  Transportation Needs: No Transportation Needs (02/26/2023)   PRAPARE - Administrator, Civil Service (Medical): No    Lack of Transportation (Non-Medical): No  Physical Activity: Insufficiently Active (02/26/2023)   Exercise Vital Sign    Days of Exercise per Week: 3 days    Minutes of Exercise per Session: 40 min  Stress: Stress Concern Present (02/26/2023)   [employer] of Occupational Health - Occupational Stress Questionnaire    Feeling of Stress : To some extent  Social Connections: Moderately Integrated  (02/26/2023)   Social Connection and Isolation Panel [NHANES]    Frequency of Communication with Friends and Family: More than three times a week    Frequency of Social Gatherings with Friends and Family: More than three times a week    Attends Religious Services: Never    Database administrator or Organizations: Yes    Attends Engineer, structural: More than 4 times per year    Marital Status: Married  Catering manager Violence: Not At Risk (02/26/2023)   Humiliation, Afraid, Rape, and Kick questionnaire    Fear of Current or Ex-Partner: No    Emotionally Abused: No    Physically Abused: No    Sexually Abused: No    Review of Systems  PHYSICAL EXAMINATION:   There were no vitals taken for this visit.    General appearance: alert, cooperative and appears stated age Head: Normocephalic, without obvious abnormality, atraumatic Neck: no adenopathy, supple, symmetrical, trachea midline and thyroid normal to inspection and palpation Lungs: clear to auscultation bilaterally Breasts: normal appearance, no masses or tenderness, No nipple retraction or dimpling, No nipple discharge or bleeding, No axillary or supraclavicular adenopathy Heart: regular rate and rhythm Abdomen: soft, non-tender, no masses,  no organomegaly Extremities: extremities normal, atraumatic, no cyanosis or edema Skin: Skin color, texture, turgor normal. No rashes or lesions Lymph nodes: Cervical, supraclavicular, and axillary nodes normal. No abnormal inguinal nodes palpated  Neurologic: Grossly normal  Pelvic: External genitalia:  no lesions              Urethra:  normal appearing urethra with no masses, tenderness or lesions              Bartholins and Skenes: normal                 Vagina: normal appearing vagina with normal color and discharge, no lesions              Cervix: no lesions                Bimanual Exam:  Uterus:  normal size, contour, position, consistency, mobility, non-tender               Adnexa: no mass, fullness, tenderness              Rectal exam: {yes no:314532}.  Confirms.              Anus:  normal sphincter tone, no lesions  Chaperone was present for exam:  {BSCHAPERONE:31226::"Kassius Battiste F, CMA"}  {LABS (Optional):23779}  ASSESSMENT:    PLAN:      ***  total time was spent for this patient encounter, including preparation, face-to-face counseling with the patient, coordination of care, and documentation of the encounter.  An After Visit Summary was provided to the patient.

## 2023-07-15 NOTE — Telephone Encounter (Signed)
Patients pharmacy sent a refill request for Metoprolol 25mg , but medication on not on her medication list. Spoke with patient, she is no longer taking Metoprolol Succinate 25mg , she stated that it was stopped at discharge. Patient stated that she will contact her pharmacy to have them discontinue new refills.

## 2023-07-20 ENCOUNTER — Encounter: Payer: Self-pay | Admitting: Internal Medicine

## 2023-07-22 DIAGNOSIS — M25551 Pain in right hip: Secondary | ICD-10-CM | POA: Diagnosis not present

## 2023-07-22 DIAGNOSIS — M6281 Muscle weakness (generalized): Secondary | ICD-10-CM | POA: Diagnosis not present

## 2023-07-22 DIAGNOSIS — M25651 Stiffness of right hip, not elsewhere classified: Secondary | ICD-10-CM | POA: Diagnosis not present

## 2023-07-22 DIAGNOSIS — M25562 Pain in left knee: Secondary | ICD-10-CM | POA: Diagnosis not present

## 2023-07-23 ENCOUNTER — Ambulatory Visit: Payer: Medicare Other | Admitting: Family Medicine

## 2023-07-23 ENCOUNTER — Ambulatory Visit
Admission: RE | Admit: 2023-07-23 | Discharge: 2023-07-23 | Disposition: A | Discharge status: Home / Self Care | Payer: Medicare Other | Source: Ambulatory Visit | Attending: Family Medicine

## 2023-07-23 ENCOUNTER — Encounter: Payer: Self-pay | Admitting: Family Medicine

## 2023-07-23 VITALS — BP 116/86 | Ht 64.0 in | Wt 140.0 lb

## 2023-07-23 DIAGNOSIS — M5412 Radiculopathy, cervical region: Secondary | ICD-10-CM

## 2023-07-23 DIAGNOSIS — M542 Cervicalgia: Secondary | ICD-10-CM | POA: Diagnosis not present

## 2023-07-23 MED ORDER — TRAMADOL HCL 50 MG PO TABS
50.0000 mg | ORAL_TABLET | Freq: Four times a day (QID) | ORAL | 0 refills | Status: DC | PRN
Start: 2023-07-23 — End: 2023-07-23

## 2023-07-23 MED ORDER — TRAMADOL HCL 50 MG PO TABS
50.0000 mg | ORAL_TABLET | Freq: Four times a day (QID) | ORAL | 0 refills | Status: DC | PRN
Start: 2023-07-23 — End: 2023-10-11

## 2023-07-23 MED ORDER — CYCLOBENZAPRINE HCL 10 MG PO TABS: 10.0000 mg | ORAL_TABLET | Freq: Three times a day (TID) | ORAL | 0 refills | Status: DC | PRN

## 2023-07-23 MED ORDER — PREDNISONE 10 MG PO TABS
10.0000 mg | ORAL_TABLET | Freq: Every day | ORAL | 0 refills | Status: DC
Start: 2023-07-23 — End: 2023-08-19

## 2023-07-23 NOTE — Patient Instructions (Signed)
Please go get your x-rays done.  Please take your steroid as directed, you may take the tramadol for any breakthrough pain.  Follow-up in 3 weeks.

## 2023-07-23 NOTE — Progress Notes (Addendum)
PCP: Plotnikov, Georgina Quint, MD  Chief Complaint: Neck pain Subjective:   HPI: Patient is a 88 y.o. female here for acute neck pain that started approximately 1 week ago.  Patient states that she has neck pain that appears to start in her cervical region and goes laterally, patient states that the pain travels down her trapezius.  Patient notes that the pain did travel into her arm although right now is located more so in the trapezius area.  Patient denies any trauma to the area.  Patient denies any numbness tingling at this time and states that it is all pain.  Patient is never had pain like this in the past.  Patient feels like she has appropriate strength at this time.  Patient states that the pain is anywhere between a 7-10 out of 10 in severity.   Past Medical History:  Diagnosis Date   Acute blood loss anemia 12/25/2017   Allergic rhinitis    Anxiety    Arthritis    "my whole spine" (07/01/2017)   Arthritis    Atrial fibrillation (HCC)    Bowel obstruction (HCC)    in [place]   Bradycardia with 41-50 beats per minute 12/27/2017   Cancer (HCC)    Cellulitis of leg, right with large prepatella hematoma and open wounds 12/26/2017   CKD (chronic kidney disease) stage 2, GFR 60-89 ml/min    Colon polyps    Coronary artery disease    10/18 PCI/DES to p/m LCx with cutting balloon to mLcx   Diverticulosis of colon    GERD (gastroesophageal reflux disease)    Hip bursitis 2010   Dr Wyline Mood, Post op seroma   History of colon polyps    HSV (herpes simplex virus) anogenital infection 07/2019   HTN (hypertension)    IBS (irritable bowel syndrome)    constipation predominant - Dr Kinnie Scales   Lichen sclerosus    Osteopenia 11/2016   T score -2.0 FRAX 15%/4.3%   PAC (premature atrial contraction)    Symptomatiic   Renal insufficiency    Right bundle branch block (RBBB) on electrocardiogram (ECG) 12/27/2017   Scoliosis    SVT (supraventricular tachycardia) (HCC)    brief history   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 Maximum of 6 tablets daily (3000mg )     apixaban (ELIQUIS) 2.5 MG TABS tablet Take 2.5 mg by mouth 2 (two) times daily.     BIOTIN PO Take 5,000 mcg by mouth in the morning.     clobetasol ointment (TEMOVATE) 0.05 % Apply to the affected area in a thin layer twice a day for 2 weeks as needed for a flare.  Then apply to the area twice a week at bedtime as maintenance dosing. 60 g 1   diclofenac Sodium (VOLTAREN) 1 % GEL Apply 2 g topically 4 (four) times daily as needed (back pain.).     dicyclomine (BENTYL) 20 MG tablet Take 1 by mouth every 4-6 hours as needed for cramping 30 tablet 6   empagliflozin (JARDIANCE) 10 MG TABS tablet Take 1 tablet (10 mg total) by mouth daily before breakfast. 90 tablet 2   escitalopram (LEXAPRO) 5 MG tablet Take 1 tablet (5 mg total) by mouth  every evening. 30 tablet 5   esomeprazole (NEXIUM) 40 MG capsule TAKE ONE CAPSULE ONCE DAILY 90 capsule 1   GABAPENTIN EX Apply 1 Application topically 3 (three) times daily as needed (To vulva). Medication: Gabapentin 6% cream     Homeopathic Products (ARNICARE) GEL Apply 1 application  topically daily as needed (skin bruising).     hyoscyamine (LEVSIN) 0.125 MG tablet TAKE 1-2 TABLETS EVERY 4 HOURS AS NEEDED FOR UP TO 10 DAYS FOR CRAMPING. 100 tablet 3   lidocaine (XYLOCAINE) 5 % ointment Apply 1 Application topically 3 (three) times daily. Uses as needed three times a day for pain of the vulva. 1.25 g 1   LORazepam (ATIVAN) 1 MG tablet TAKE ONE TABLET BY MOUTH TWICE DAILY AS NEEDED FOR ANXIETY / SLEEP. 180 tablet 1   losartan (COZAAR) 25 MG tablet Take 1 tablet (25 mg total) by mouth daily. 90 tablet 3   meclizine (ANTIVERT) 12.5  MG tablet Take 1 tablet (12.5 mg total) by mouth 3 (three) times daily as needed for dizziness. 60 tablet 1   nitroGLYCERIN (NITROSTAT) 0.4 MG SL tablet Place 1 tablet (0.4 mg total) under the tongue every 5 (five) minutes as needed for chest pain (Call 911 at 3rd dose within 15 minutes.). 20 tablet 3   ondansetron (ZOFRAN-ODT) 8 MG disintegrating tablet Take 1 tablet (8 mg total) by mouth every 8 (eight) hours as needed for nausea or vomiting. 12 tablet 3   Polyethyl Glycol-Propyl Glycol (LUBRICANT EYE DROPS) 0.4-0.3 % SOLN Place 1-2 drops into both eyes at bedtime.     polyethylene glycol (MIRALAX / GLYCOLAX) 17 g packet Take 17 g by mouth as needed for mild constipation or moderate constipation.     sodium chloride (OCEAN) 0.65 % SOLN nasal spray Place 1 spray into both nostrils at bedtime as needed for congestion.     No current facility-administered medications on file prior to visit.    Past Surgical History:  Procedure Laterality Date   ANTERIOR AND POSTERIOR VAGINAL REPAIR  01/2002   Hattie Perch 01/23/2011   APPENDECTOMY  1946   CARDIAC CATHETERIZATION  06/26/2017   CORONARY ANGIOPLASTY WITH STENT PLACEMENT  07/01/2017   CORONARY ATHERECTOMY N/A 07/01/2017   Procedure: CORONARY ATHERECTOMY;  Surgeon: Lyn Records, MD;  Location: [facility] INVASIVE CV LAB;  Service: Cardiovascular;  Laterality: N/A;   CORONARY STENT INTERVENTION N/A 07/01/2017   Procedure: CORONARY STENT INTERVENTION;  Surgeon: Lyn Records, MD;  Location: [facility] INVASIVE CV LAB;  Service: Cardiovascular;  Laterality: N/A;   HAMMER TOE SURGERY     HEMORRHOID BANDING     HIP SURGERY Left 04/2009   hip examination under anesthesia followed by greater trochanteric bursectomy; iliotibial band tenotomy/notes 01/20/2011   I & D EXTREMITY Right 01/10/2018   Procedure: IRRIGATION AND DEBRIDEMENT RIGHT KNEE, APPLY WOUND VAC;  Surgeon: Nadara Mustard, MD;  Location: [facility] OR;  Service: Orthopedics;  Laterality: Right;   KNEE BURSECTOMY Right  04/2009   Hattie Perch 01/09/2011   LEFT ATRIAL APPENDAGE OCCLUSION N/A 08/24/2021   Procedure: LEFT ATRIAL APPENDAGE OCCLUSION;  Surgeon: Tonny Bollman, MD;  Location: [facility] INVASIVE CV LAB;  Service: Cardiovascular;  Laterality: N/A;   LEFT HEART CATH AND CORONARY ANGIOGRAPHY N/A 06/26/2017   Procedure: LEFT HEART CATH AND CORONARY ANGIOGRAPHY;  Surgeon: Lyn Records, MD;  Location: [facility] INVASIVE CV LAB;  Service: Cardiovascular;  Laterality: N/A;   PUBOVAGINAL SLING  01/2002   Hattie Perch 01/23/2011   REDUCTION MAMMAPLASTY  TEE WITHOUT CARDIOVERSION N/A 08/24/2021   Procedure: TRANSESOPHAGEAL ECHOCARDIOGRAM (TEE);  Surgeon: Tonny Bollman, MD;  Location: [facility] INVASIVE CV LAB;  Service: Cardiovascular;  Laterality: N/A;   TEMPORARY PACEMAKER N/A 07/01/2017   Procedure: TEMPORARY PACEMAKER;  Surgeon: Lyn Records, MD;  Location: [facility] INVASIVE CV LAB;  Service: Cardiovascular;  Laterality: N/A;   VAGINAL HYSTERECTOMY  01/2002   Vaginal hysterectomy, bilateral salpingo-oophorectomy/notes 01/23/2011    Allergies  Allergen Reactions   Macrobid [Nitrofurantoin Monohyd Macro] Other (See Comments)    Nausea, stomach cramps, fatigue , headache.   Meloxicam Other (See Comments)    Jittery and headache   Digoxin And Related Other (See Comments)    headaches   Diprolene [Betamethasone Dipropionate Aug] Other (See Comments)    Patient states that the bruising was made worse when using this   Ferrous Sulfate Other (See Comments)    Bad constipation    Keflex [Cephalexin] Nausea And Vomiting    Nausea, fatigue, and headache.   Lipitor [Atorvastatin]     Abd pain   Mobic [Meloxicam] Other (See Comments)    Unsure of reaction type   Sulfamethoxazole-Trimethoprim Nausea Only    BP 116/86   Ht 5\' 4"  (1.626 m)   Wt 140 lb (63.5 kg)   BMI 24.03 kg/m       No data to display              No data to display              Objective:  Physical Exam:  Gen: NAD, comfortable in exam  room  Cervical spine:   - Inspection: no gross deformity or asymmetry, swelling or ecchymosis. No skin changes.  - Palpation: No TTP over the spinous processes, mild TTP over paraspinal muscles. More tenderness over the lateral left scalene muscles  - ROM: full active ROM of the cervical spine in flex/ext/SB/rotation with pain on flex/ext.  - Strength: 5/5 strength of upper extremity in C5-T1 nerve root distributions b/l  *C5: Shoulder abduction  *C6: Elbow flexion, Wrist extension  *C7: Elbow extension  *C8: Thumb extension, Wrist Ulnar deviation  *T1: Finger abduction    - Neuro: sensation intact in the C5-T1 nerve root distribution b/l; Tricep/Bicep DTR's   - Provocative Testing: Positive Spurling's Test    Assessment & Plan:  1. Cervical radiculitis -   Patient's symptoms do appear to be secondary to cervical radiculitis.  Given patient's acuity of symptoms, we will go ahead and give a steroid taper at this time.  Will also do Flexeril as needed.  Patient will have tramadol for breakthrough pain given the severity of it, patient is on blood thinners and does have a history of allergy to meloxicam.  Patient was advised that if her pain starts to improve, limit the amount of ibuprofen used.  Will also get cervical x-rays at this time.  Patient follow-up in 3 weeks, at that time we will reevaluate to see how patient is doing.  If no improvement will consider MRI and starting gabapentin. - predniSONE (DELTASONE) 10 MG tablet; Take 1 tablet (10 mg total) by mouth daily with breakfast.  Dispense: 48 tablet; Refill: 0 - traMADol (ULTRAM) 50 MG tablet; Take 1 tablet (50 mg total) by mouth every 6 (six) hours as needed.  Dispense: 30 tablet; Refill: 0 - DG Cervical Spine 2 or 3 views; Future - cyclobenzaprine (FLEXERIL) 10 MG tablet; Take 1 tablet (10 mg total) by mouth 3 (three) times daily as needed  for muscle spasms.  Dispense: 30 tablet; Refill: 0     Brenton Grills MD, PGY-4  Sports Medicine  Fellow [facility]  Addendum:  Patient seen and examined in the office by fellow.   History, exam, plan of care were precepted with me.  Agree with findings as documented in fellow note above.  Darene Lamer, DO, CAQSM

## 2023-07-26 ENCOUNTER — Telehealth: Payer: Self-pay

## 2023-07-26 NOTE — Telephone Encounter (Signed)
-----   Message from CMA Alora B sent at 07/26/2023  9:57 AM EST ----- Patient cancelled follow up appt about vulvitis and states it is cleared up and does not wish to come in.

## 2023-07-26 NOTE — Telephone Encounter (Signed)
Encounter reviewed and closed.  

## 2023-07-29 ENCOUNTER — Ambulatory Visit: Payer: Medicare Other | Admitting: Obstetrics and Gynecology

## 2023-07-31 DIAGNOSIS — S42212D Unspecified displaced fracture of surgical neck of left humerus, subsequent encounter for fracture with routine healing: Secondary | ICD-10-CM | POA: Diagnosis not present

## 2023-07-31 DIAGNOSIS — M5090 Cervical disc disorder, unspecified, unspecified cervical region: Secondary | ICD-10-CM | POA: Diagnosis not present

## 2023-07-31 DIAGNOSIS — M25511 Pain in right shoulder: Secondary | ICD-10-CM | POA: Diagnosis not present

## 2023-08-01 DIAGNOSIS — M25562 Pain in left knee: Secondary | ICD-10-CM | POA: Diagnosis not present

## 2023-08-01 DIAGNOSIS — M25551 Pain in right hip: Secondary | ICD-10-CM | POA: Diagnosis not present

## 2023-08-01 DIAGNOSIS — M25651 Stiffness of right hip, not elsewhere classified: Secondary | ICD-10-CM | POA: Diagnosis not present

## 2023-08-01 DIAGNOSIS — M6281 Muscle weakness (generalized): Secondary | ICD-10-CM | POA: Diagnosis not present

## 2023-08-05 DIAGNOSIS — M25562 Pain in left knee: Secondary | ICD-10-CM | POA: Diagnosis not present

## 2023-08-05 DIAGNOSIS — M25551 Pain in right hip: Secondary | ICD-10-CM | POA: Diagnosis not present

## 2023-08-05 DIAGNOSIS — M25651 Stiffness of right hip, not elsewhere classified: Secondary | ICD-10-CM | POA: Diagnosis not present

## 2023-08-05 DIAGNOSIS — M6281 Muscle weakness (generalized): Secondary | ICD-10-CM | POA: Diagnosis not present

## 2023-08-12 DIAGNOSIS — M542 Cervicalgia: Secondary | ICD-10-CM | POA: Diagnosis not present

## 2023-08-12 DIAGNOSIS — R293 Abnormal posture: Secondary | ICD-10-CM | POA: Diagnosis not present

## 2023-08-12 DIAGNOSIS — M2569 Stiffness of other specified joint, not elsewhere classified: Secondary | ICD-10-CM | POA: Diagnosis not present

## 2023-08-12 DIAGNOSIS — M6281 Muscle weakness (generalized): Secondary | ICD-10-CM | POA: Diagnosis not present

## 2023-08-13 ENCOUNTER — Ambulatory Visit: Payer: Medicare Other | Admitting: Family Medicine

## 2023-08-14 ENCOUNTER — Ambulatory Visit (HOSPITAL_COMMUNITY): Payer: Medicare Other | Attending: Cardiology

## 2023-08-14 DIAGNOSIS — I7121 Aneurysm of the ascending aorta, without rupture: Secondary | ICD-10-CM | POA: Diagnosis not present

## 2023-08-14 LAB — ECHOCARDIOGRAM COMPLETE
Area-P 1/2: 4.5 cm2
MV M vel: 5.01 m/s
MV Peak grad: 100.4 mm[Hg]
Radius: 0.7 cm
S' Lateral: 2.3 cm

## 2023-08-19 ENCOUNTER — Encounter: Payer: Self-pay | Admitting: Internal Medicine

## 2023-08-19 ENCOUNTER — Ambulatory Visit (INDEPENDENT_AMBULATORY_CARE_PROVIDER_SITE_OTHER): Payer: Medicare Other | Admitting: Internal Medicine

## 2023-08-19 VITALS — BP 118/80 | HR 53 | Temp 98.2°F | Ht 64.0 in | Wt 143.0 lb

## 2023-08-19 DIAGNOSIS — I10 Essential (primary) hypertension: Secondary | ICD-10-CM | POA: Diagnosis not present

## 2023-08-19 DIAGNOSIS — M545 Low back pain, unspecified: Secondary | ICD-10-CM

## 2023-08-19 DIAGNOSIS — I25119 Atherosclerotic heart disease of native coronary artery with unspecified angina pectoris: Secondary | ICD-10-CM | POA: Diagnosis not present

## 2023-08-19 DIAGNOSIS — I4821 Permanent atrial fibrillation: Secondary | ICD-10-CM

## 2023-08-19 DIAGNOSIS — N1832 Chronic kidney disease, stage 3b: Secondary | ICD-10-CM | POA: Diagnosis not present

## 2023-08-19 LAB — CBC WITH DIFFERENTIAL/PLATELET
Basophils Absolute: 0 10*3/uL (ref 0.0–0.1)
Basophils Relative: 0.4 % (ref 0.0–3.0)
Eosinophils Absolute: 0.2 10*3/uL (ref 0.0–0.7)
Eosinophils Relative: 4 % (ref 0.0–5.0)
HCT: 39.5 % (ref 36.0–46.0)
Hemoglobin: 13.3 g/dL (ref 12.0–15.0)
Lymphocytes Relative: 17.4 % (ref 12.0–46.0)
Lymphs Abs: 1 10*3/uL (ref 0.7–4.0)
MCHC: 33.6 g/dL (ref 30.0–36.0)
MCV: 99.2 fL (ref 78.0–100.0)
Monocytes Absolute: 0.6 10*3/uL (ref 0.1–1.0)
Monocytes Relative: 10.3 % (ref 3.0–12.0)
Neutro Abs: 4 10*3/uL (ref 1.4–7.7)
Neutrophils Relative %: 67.9 % (ref 43.0–77.0)
Platelets: 189 10*3/uL (ref 150.0–400.0)
RBC: 3.98 Mil/uL (ref 3.87–5.11)
RDW: 15.2 % (ref 11.5–15.5)
WBC: 5.8 10*3/uL (ref 4.0–10.5)

## 2023-08-19 MED ORDER — IPRATROPIUM BROMIDE 0.06 % NA SOLN
2.0000 | Freq: Four times a day (QID) | NASAL | 5 refills | Status: DC
Start: 2023-08-19 — End: 2023-12-11

## 2023-08-19 NOTE — Assessment & Plan Note (Signed)
Low dose metoprolol, Eliquis ?

## 2023-08-19 NOTE — Assessment & Plan Note (Signed)
Continue with metoprolol and losartan.  Hydrate well 

## 2023-08-19 NOTE — Assessment & Plan Note (Signed)
Pt stopped Lipitor due to abd pain and weakness - resolved Other options discussed

## 2023-08-19 NOTE — Progress Notes (Signed)
Subjective:  Patient ID: Hailey [name], female    DOB: [birth year]  Age: 88 y.o. MRN: [id number]  CC: Medical Management of Chronic Issues (3 MNTH F/U, discuss b/l leg bruising and discoloration w/o any known injuries)   HPI Hailey [name] presents for arthralgia, anticoagulation, HTN  Outpatient Medications Prior to Visit  Medication Sig Dispense Refill   acetaminophen (TYLENOL) 500 MG tablet Take 1,000 mg by mouth every 6 (six) hours as needed (body aches / sore hip). Maximum of 6 tablets daily (3000mg )     apixaban (ELIQUIS) 2.5 MG TABS tablet Take 2.5 mg by mouth 2 (two) times daily.     BIOTIN PO Take 5,000 mcg by mouth in the morning.     clobetasol ointment (TEMOVATE) 0.05 % Apply to the affected area in a thin layer twice a day for 2 weeks as needed for a flare.  Then apply to the area twice a week at bedtime as maintenance dosing. 60 g 1   cyclobenzaprine (FLEXERIL) 10 MG tablet Take 1 tablet (10 mg total) by mouth 3 (three) times daily as needed for muscle spasms. 30 tablet 0   diclofenac Sodium (VOLTAREN) 1 % GEL Apply 2 g topically 4 (four) times daily as needed (back pain.).     dicyclomine (BENTYL) 20 MG tablet Take 1 by mouth every 4-6 hours as needed for cramping 30 tablet 6   empagliflozin (JARDIANCE) 10 MG TABS tablet Take 1 tablet (10 mg total) by mouth daily before breakfast. 90 tablet 2   escitalopram (LEXAPRO) 5 MG tablet Take 1 tablet (5 mg total) by mouth every evening. 30 tablet 5   esomeprazole (NEXIUM) 40 MG capsule TAKE ONE CAPSULE ONCE DAILY 90 capsule 1   GABAPENTIN EX Apply 1 Application topically 3 (three) times daily as needed (To vulva). Medication: Gabapentin 6% cream     Homeopathic Products (ARNICARE) GEL Apply 1 application  topically daily as needed (skin bruising).     hyoscyamine (LEVSIN) 0.125 MG tablet TAKE 1-2 TABLETS EVERY 4 HOURS AS NEEDED FOR UP TO 10 DAYS FOR CRAMPING. 100 tablet 3   lidocaine (XYLOCAINE) 5 % ointment Apply 1 Application  topically 3 (three) times daily. Uses as needed three times a day for pain of the vulva. 1.25 g 1   LORazepam (ATIVAN) 1 MG tablet TAKE ONE TABLET BY MOUTH TWICE DAILY AS NEEDED FOR ANXIETY / SLEEP. 180 tablet 1   losartan (COZAAR) 25 MG tablet Take 1 tablet (25 mg total) by mouth daily. 90 tablet 3   meclizine (ANTIVERT) 12.5 MG tablet Take 1 tablet (12.5 mg total) by mouth 3 (three) times daily as needed for dizziness. 60 tablet 1   nitroGLYCERIN (NITROSTAT) 0.4 MG SL tablet Place 1 tablet (0.4 mg total) under the tongue every 5 (five) minutes as needed for chest pain (Call 911 at 3rd dose within 15 minutes.). 20 tablet 3   ondansetron (ZOFRAN-ODT) 8 MG disintegrating tablet Take 1 tablet (8 mg total) by mouth every 8 (eight) hours as needed for nausea or vomiting. 12 tablet 3   Polyethyl Glycol-Propyl Glycol (LUBRICANT EYE DROPS) 0.4-0.3 % SOLN Place 1-2 drops into both eyes at bedtime.     polyethylene glycol (MIRALAX / GLYCOLAX) 17 g packet Take 17 g by mouth as needed for mild constipation or moderate constipation.     sodium chloride (OCEAN) 0.65 % SOLN nasal spray Place 1 spray into both nostrils at bedtime as needed for congestion.     traMADol (  ULTRAM) 50 MG tablet Take 1 tablet (50 mg total) by mouth every 6 (six) hours as needed. 30 tablet 0   predniSONE (DELTASONE) 10 MG tablet Take 1 tablet (10 mg total) by mouth daily with breakfast. 48 tablet 0   No facility-administered medications prior to visit.    ROS: Review of Systems  Constitutional:  Negative for activity change, appetite change, chills, fatigue and unexpected weight change.  HENT:  Negative for congestion, mouth sores and sinus pressure.   Eyes:  Negative for visual disturbance.  Respiratory:  Negative for cough and chest tightness.   Gastrointestinal:  Negative for abdominal pain and nausea.  Genitourinary:  Negative for difficulty urinating, frequency and vaginal pain.  Musculoskeletal:  Positive for gait problem.  Negative for back pain.  Skin:  Negative for pallor and rash.  Neurological:  Negative for dizziness, tremors, weakness, numbness and headaches.  Hematological:  Bruises/bleeds easily.  Psychiatric/Behavioral:  Negative for confusion and sleep disturbance.     Objective:  BP 118/80 (BP Location: Left Arm, Patient Position: Sitting, Cuff Size: Normal)   Pulse (!) 53   Temp 98.2 F (36.8 C) (Oral)   Ht 5\' 4"  (1.626 m)   Wt 143 lb (64.9 kg)   SpO2 96%   BMI 24.55 kg/m   BP Readings from Last 3 Encounters:  08/19/23 118/80  07/23/23 116/86  06/28/23 118/64    Wt Readings from Last 3 Encounters:  08/19/23 143 lb (64.9 kg)  07/23/23 140 lb (63.5 kg)  06/28/23 143 lb (64.9 kg)    Physical Exam Constitutional:      General: She is not in acute distress.    Appearance: She is well-developed.  HENT:     Head: Normocephalic.     Right Ear: External ear normal.     Left Ear: External ear normal.     Nose: Nose normal.  Eyes:     General:        Right eye: No discharge.        Left eye: No discharge.     Conjunctiva/sclera: Conjunctivae normal.     Pupils: Pupils are equal, round, and reactive to light.  Neck:     Thyroid: No thyromegaly.     Vascular: No JVD.     Trachea: No tracheal deviation.  Cardiovascular:     Rate and Rhythm: Normal rate and regular rhythm.     Heart sounds: Normal heart sounds.  Pulmonary:     Effort: No respiratory distress.     Breath sounds: No stridor. No wheezing.  Abdominal:     General: Bowel sounds are normal. There is no distension.     Palpations: Abdomen is soft. There is no mass.     Tenderness: There is no abdominal tenderness. There is no guarding or rebound.  Musculoskeletal:        General: No tenderness.     Cervical back: Normal range of motion and neck supple. No rigidity.  Lymphadenopathy:     Cervical: No cervical adenopathy.  Skin:    Findings: No erythema or rash.  Neurological:     Cranial Nerves: No cranial nerve  deficit.     Motor: No abnormal muscle tone.     Coordination: Coordination normal.     Deep Tendon Reflexes: Reflexes normal.  Psychiatric:        Behavior: Behavior normal.        Thought Content: Thought content normal.        Judgment: Judgment normal.  Lab Results  Component Value Date   WBC 7.0 02/18/2023   HGB 11.0 (L) 02/18/2023   HCT 33.3 (L) 02/18/2023   PLT 381.0 02/18/2023   GLUCOSE 91 02/18/2023   CHOL 145 02/18/2023   TRIG 125.0 02/18/2023   HDL 63.60 02/18/2023   LDLDIRECT 89.1 06/29/2013   LDLCALC 57 02/18/2023   ALT 12 02/07/2023   AST 22 02/07/2023   NA 137 02/18/2023   K 4.3 02/18/2023   CL 103 02/18/2023   CREATININE 1.20 (H) 05/16/2023   BUN 20 02/18/2023   CO2 25 02/18/2023   TSH 1.685 02/07/2023   INR 1.2 02/07/2023   HGBA1C 5.5 02/08/2023    DG Cervical Spine 2 or 3 views  Result Date: 07/29/2023 CLINICAL DATA:  Neck pain. EXAM: CERVICAL SPINE - 2-3 VIEW COMPARISON:  None Available. FINDINGS: Mild grade 1 anterolisthesis of C3-4 is noted secondary to posterior facet joint hypertrophy. Severe degenerative disc disease is noted at C4-5, C5-6, C6-7 and C7-T1. No definite fracture is noted. IMPRESSION: Severe multilevel degenerative disc disease. No acute abnormality seen. Electronically Signed   By: Lupita Raider M.D.   On: 07/29/2023 09:45    Assessment & Plan:   Problem List Items Addressed This Visit     Essential hypertension    Continue with metoprolol and losartan.  Hydrate well      RESOLVED: LOW BACK PAIN, ACUTE   Permanent atrial fibrillation (HCC) - Primary    Low dose metoprolol, Eliquis      Coronary artery disease involving native coronary artery of native heart with angina pectoris (HCC)    Pt stopped Lipitor due to abd pain and weakness - resolved Other options discussed      Chronic kidney disease, stage 3b (HCC)    Hydrate well          Meds ordered this encounter  Medications   ipratropium (ATROVENT)  0.06 % nasal spray    Sig: Place 2 sprays into the nose 4 (four) times daily.    Dispense:  15 mL    Refill:  5      Follow-up: Return in about 3 months (around 11/17/2023) for a follow-up visit.  Sonda Primes, MD

## 2023-08-19 NOTE — Assessment & Plan Note (Signed)
Hydrate well 

## 2023-08-20 ENCOUNTER — Telehealth: Payer: Self-pay | Admitting: Internal Medicine

## 2023-08-20 DIAGNOSIS — M2569 Stiffness of other specified joint, not elsewhere classified: Secondary | ICD-10-CM | POA: Diagnosis not present

## 2023-08-20 DIAGNOSIS — R293 Abnormal posture: Secondary | ICD-10-CM | POA: Diagnosis not present

## 2023-08-20 DIAGNOSIS — M6281 Muscle weakness (generalized): Secondary | ICD-10-CM | POA: Diagnosis not present

## 2023-08-20 DIAGNOSIS — M542 Cervicalgia: Secondary | ICD-10-CM | POA: Diagnosis not present

## 2023-08-20 LAB — COMPREHENSIVE METABOLIC PANEL
ALT: 11 U/L (ref 0–35)
AST: 17 U/L (ref 0–37)
Albumin: 4 g/dL (ref 3.5–5.2)
Alkaline Phosphatase: 57 U/L (ref 39–117)
BUN: 26 mg/dL — ABNORMAL HIGH (ref 6–23)
CO2: 25 meq/L (ref 19–32)
Calcium: 9 mg/dL (ref 8.4–10.5)
Chloride: 104 meq/L (ref 96–112)
Creatinine, Ser: 1.11 mg/dL (ref 0.40–1.20)
GFR: 44.94 mL/min — ABNORMAL LOW (ref >60.00)
Glucose, Bld: 98 mg/dL (ref 70–99)
Potassium: 4.5 meq/L (ref 3.5–5.1)
Sodium: 138 meq/L (ref 135–145)
Total Bilirubin: 0.5 mg/dL (ref 0.2–1.2)
Total Protein: 6.8 g/dL (ref 6.0–8.3)

## 2023-08-20 NOTE — Telephone Encounter (Signed)
Patient husband dropped off document [employer]  form, to be filled out by provider. Patient requested to send it back via Call Patient to pick up within 7-days. Document is located in providers tray at front office.Please advise at Home [phone]

## 2023-08-21 ENCOUNTER — Encounter: Payer: Self-pay | Admitting: Internal Medicine

## 2023-08-22 NOTE — Telephone Encounter (Signed)
Document was given to PCP.

## 2023-08-28 DIAGNOSIS — M542 Cervicalgia: Secondary | ICD-10-CM | POA: Diagnosis not present

## 2023-08-28 DIAGNOSIS — M6281 Muscle weakness (generalized): Secondary | ICD-10-CM | POA: Diagnosis not present

## 2023-08-28 DIAGNOSIS — M2569 Stiffness of other specified joint, not elsewhere classified: Secondary | ICD-10-CM | POA: Diagnosis not present

## 2023-08-28 DIAGNOSIS — R293 Abnormal posture: Secondary | ICD-10-CM | POA: Diagnosis not present

## 2023-08-28 NOTE — Telephone Encounter (Signed)
Pt is requesting a status update on her form.

## 2023-08-29 NOTE — Telephone Encounter (Signed)
Patient has picked up forms.

## 2023-08-29 NOTE — Telephone Encounter (Signed)
I was able to inform the pt that her forms are ready for pick up.  Forms have been placed up front for pt to pick up.

## 2023-09-01 ENCOUNTER — Encounter: Payer: Self-pay | Admitting: Internal Medicine

## 2023-09-02 MED ORDER — ALBUTEROL SULFATE HFA 108 (90 BASE) MCG/ACT IN AERS: 2.0000 | INHALATION_SPRAY | Freq: Four times a day (QID) | RESPIRATORY_TRACT | 1 refills | Status: DC | PRN

## 2023-09-02 MED ORDER — NIRMATRELVIR/RITONAVIR (PAXLOVID) TABLET (RENAL DOSING): 2.0000 | ORAL_TABLET | Freq: Two times a day (BID) | ORAL | 0 refills | Status: AC

## 2023-09-02 NOTE — Telephone Encounter (Signed)
PATIENT SPOUSE IS CALLING IN FOR PATIENT    PATIENT IS NEEDING MEDICATION FOR COVID CALLED INTO THE PHARMACY

## 2023-09-09 DIAGNOSIS — M25512 Pain in left shoulder: Secondary | ICD-10-CM | POA: Diagnosis not present

## 2023-09-09 DIAGNOSIS — S42212D Unspecified displaced fracture of surgical neck of left humerus, subsequent encounter for fracture with routine healing: Secondary | ICD-10-CM | POA: Diagnosis not present

## 2023-09-11 DIAGNOSIS — B009 Herpesviral infection, unspecified: Secondary | ICD-10-CM

## 2023-09-11 HISTORY — DX: Herpesviral infection, unspecified: B00.9

## 2023-09-12 ENCOUNTER — Ambulatory Visit: Payer: Medicare Other | Admitting: Internal Medicine

## 2023-09-13 ENCOUNTER — Other Ambulatory Visit: Payer: Self-pay | Admitting: Orthopedic Surgery

## 2023-09-13 DIAGNOSIS — G8929 Other chronic pain: Secondary | ICD-10-CM

## 2023-09-13 DIAGNOSIS — S42212D Unspecified displaced fracture of surgical neck of left humerus, subsequent encounter for fracture with routine healing: Secondary | ICD-10-CM

## 2023-09-15 IMAGING — MG MM DIGITAL DIAGNOSTIC UNILAT*R* W/ TOMO W/ CAD
6 series · 6 of 18 positions shown · non-contrast
Comparison: Previous exam(s).

CLINICAL DATA: Screening recall for possible right breast
asymmetry.

EXAM:
DIGITAL DIAGNOSTIC UNILATERAL RIGHT MAMMOGRAM WITH TOMOSYNTHESIS AND
CAD
TECHNIQUE: Right digital diagnostic mammography and breast tomosynthesis was
performed. The images were evaluated with computer-aided detection.

[R MLO synth-2D]
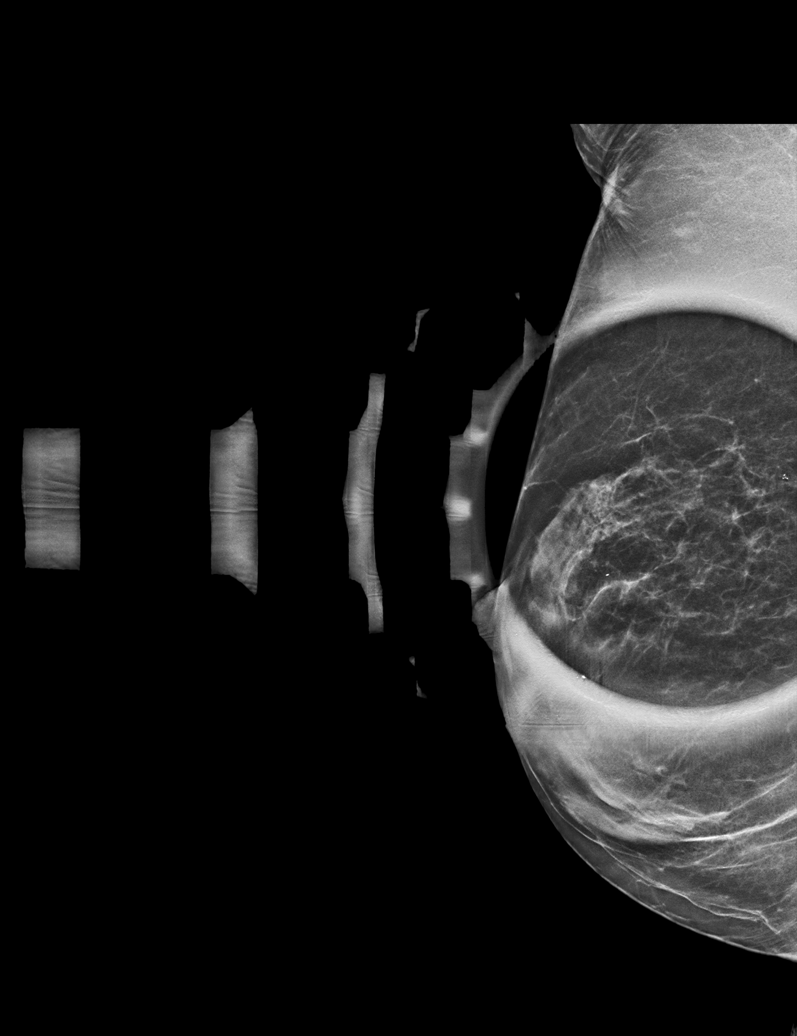

[R ML synth-2D]
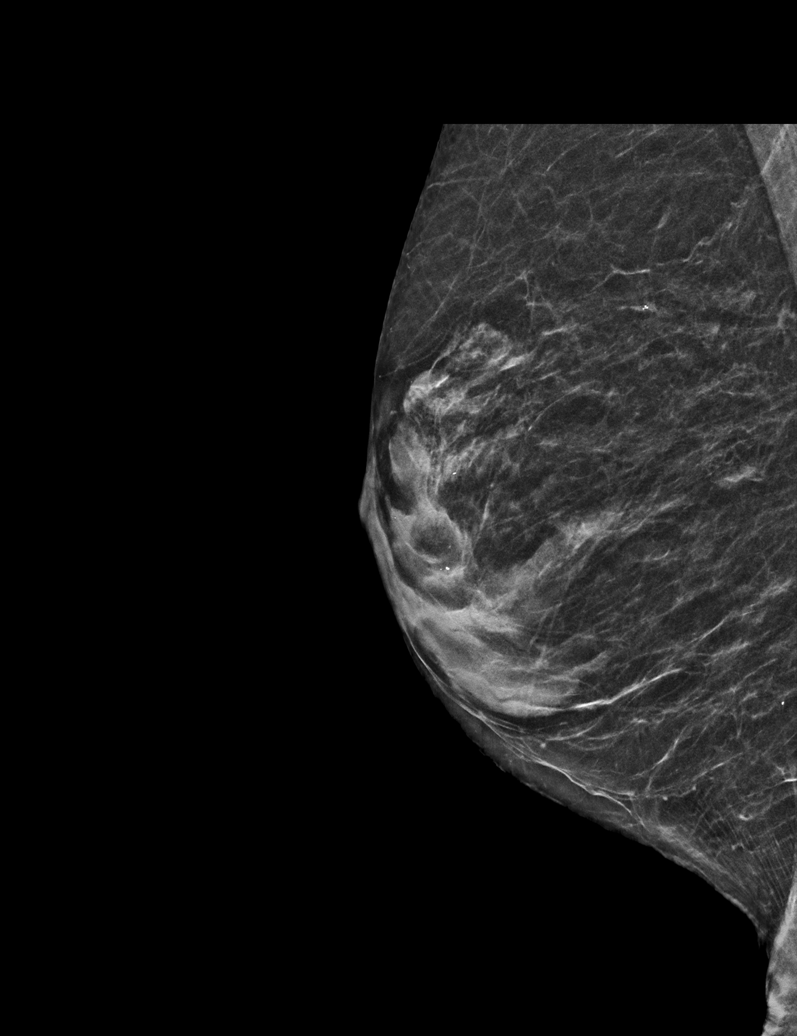

[R CC synth-2D]
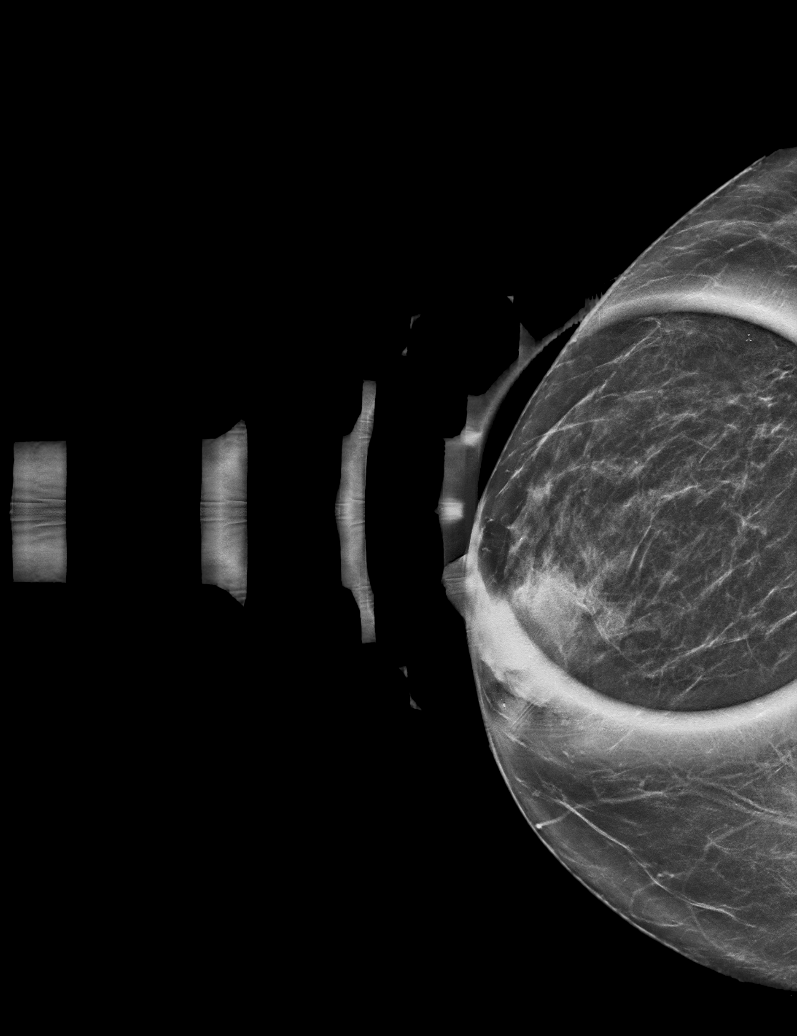

[R ML tomo · tomo slice 23/46.0]
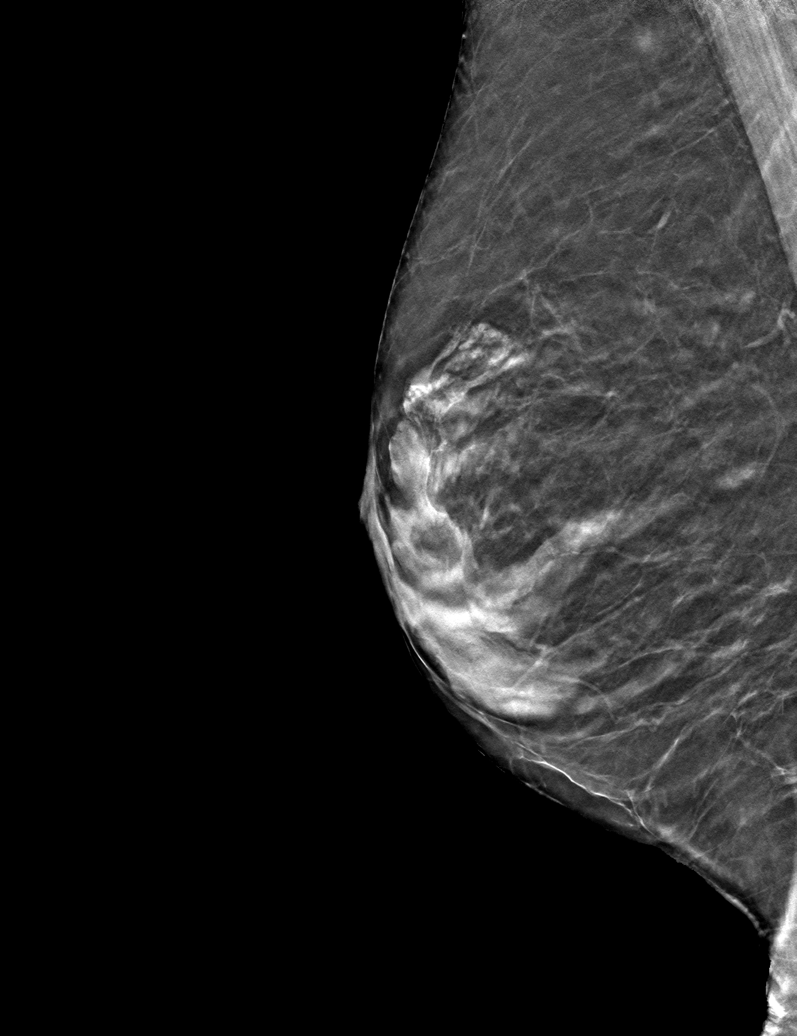

[R MLO tomo · tomo slice 21/42.0]
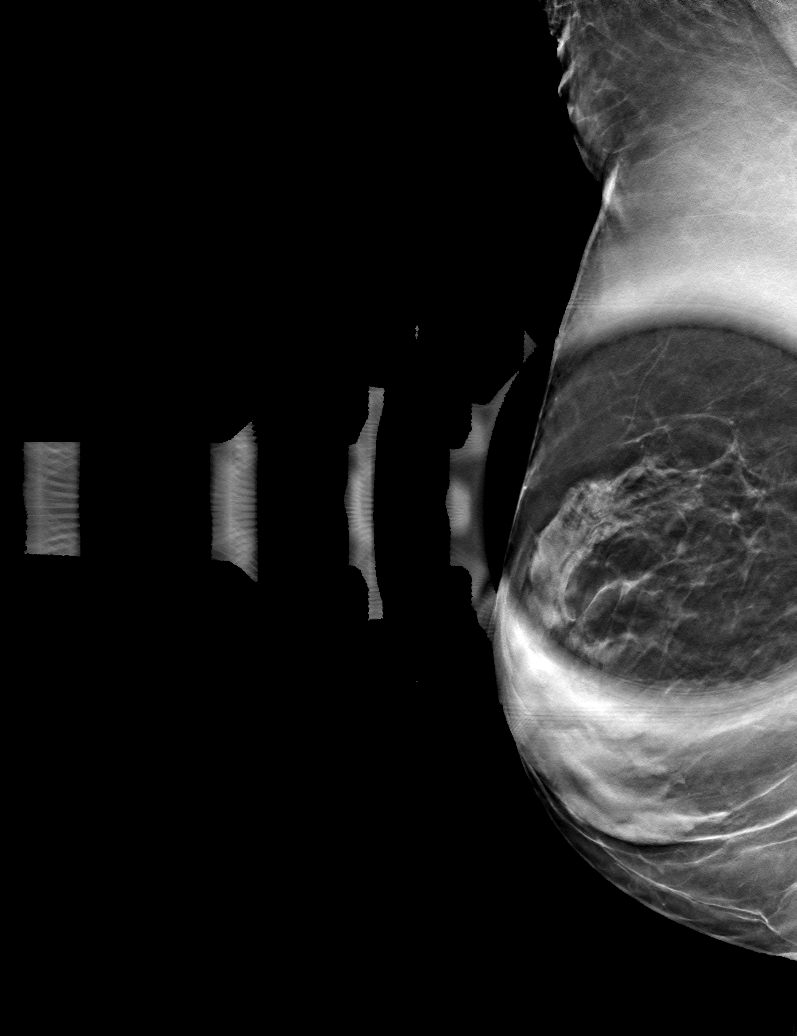

[R CC tomo · tomo slice 25/50.0]
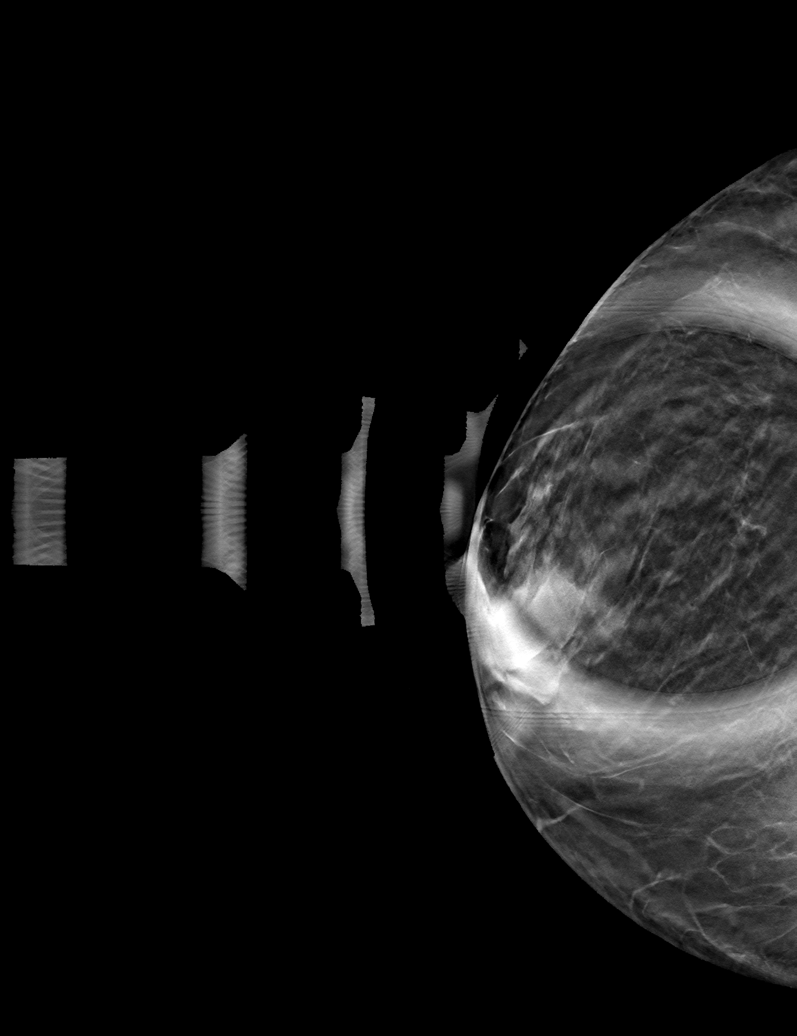

[6 of 18 positions shown; findings below may reference images not displayed]

ACR Breast Density Category c: The breast tissue is heterogeneously
dense, which may obscure small masses.
FINDINGS: Additional tomograms were performed of the right breast. The
initially questioned possible right breast asymmetry resolves on the
additional imaging with findings compatible with an area of
overlapping fibroglandular tissue. There is no mammographic evidence
of malignancy in the right breast.
IMPRESSION: No mammographic evidence of malignancy in the right breast.

RECOMMENDATION:
Screening mammogram in one year.(Code:C1-Z-XSR)

I have discussed the findings and recommendations with the patient.
If applicable, a reminder letter will be sent to the patient
regarding the next appointment.

BI-RADS CATEGORY  1: Negative.

## 2023-09-16 ENCOUNTER — Other Ambulatory Visit: Payer: Self-pay | Admitting: Cardiology

## 2023-09-16 NOTE — Telephone Encounter (Signed)
 Prescription refill request for Eliquis received. Indication:afib Last office visit:10/24 Scr:1.11  12/24 Age: 89 Weight:64.9  kg  Prescription refilled

## 2023-09-23 ENCOUNTER — Other Ambulatory Visit: Payer: Self-pay | Admitting: Internal Medicine

## 2023-09-24 ENCOUNTER — Encounter: Payer: Self-pay | Admitting: Internal Medicine

## 2023-09-24 ENCOUNTER — Ambulatory Visit (INDEPENDENT_AMBULATORY_CARE_PROVIDER_SITE_OTHER): Payer: Medicare Other | Admitting: Internal Medicine

## 2023-09-24 VITALS — BP 110/78 | HR 86 | Temp 98.2°F | Ht 64.0 in | Wt 143.0 lb

## 2023-09-24 DIAGNOSIS — R222 Localized swelling, mass and lump, trunk: Secondary | ICD-10-CM | POA: Insufficient documentation

## 2023-09-24 DIAGNOSIS — I4821 Permanent atrial fibrillation: Secondary | ICD-10-CM | POA: Diagnosis not present

## 2023-09-24 DIAGNOSIS — R052 Subacute cough: Secondary | ICD-10-CM

## 2023-09-24 DIAGNOSIS — U071 COVID-19: Secondary | ICD-10-CM | POA: Insufficient documentation

## 2023-09-24 MED ORDER — FLUCONAZOLE 150 MG PO TABS: 150.0000 mg | ORAL_TABLET | Freq: Once | ORAL | 1 refills | Status: AC

## 2023-09-24 MED ORDER — AZITHROMYCIN 250 MG PO TABS: ORAL_TABLET | ORAL | 0 refills | Status: DC

## 2023-09-24 NOTE — Assessment & Plan Note (Signed)
Low dose metoprolol, Eliquis ?

## 2023-09-24 NOTE — Progress Notes (Signed)
 Subjective:  Patient ID: Hailey [name], female    DOB: 1934-11-11  Age: 89 y.o. MRN: [id number]  CC: Medical Management of Chronic Issues (Chest mass f/u. Pt states she is still having a cough and some congestion since having COVID on Christmas.)   HPI Hailey [name] presents for  -  Chest mass f/u - L supraclavicular mass; CT is pending by Ortho, Dr Celena. Pt states she is still having a cough and some congestion since having COVID on Christmas - took Paxlovid .) C/o dry cough  Outpatient Medications Prior to Visit  Medication Sig Dispense Refill   acetaminophen  (TYLENOL ) 500 MG tablet Take 1,000 mg by mouth every 6 (six) hours as needed (body aches / sore hip). Maximum of 6 tablets daily (3000mg )     albuterol  (VENTOLIN  HFA) 108 (90 Base) MCG/ACT inhaler Inhale 2 puffs into the lungs every 6 (six) hours as needed for wheezing or shortness of breath. 8 g 1   BIOTIN PO Take 5,000 mcg by mouth in the morning.     clobetasol  ointment (TEMOVATE ) 0.05 % Apply to the affected area in a thin layer twice a day for 2 weeks as needed for a flare.  Then apply to the area twice a week at bedtime as maintenance dosing. 60 g 1   cyclobenzaprine  (FLEXERIL ) 10 MG tablet Take 1 tablet (10 mg total) by mouth 3 (three) times daily as needed for muscle spasms. 30 tablet 0   diclofenac  Sodium (VOLTAREN ) 1 % GEL Apply 2 g topically 4 (four) times daily as needed (back pain.).     dicyclomine  (BENTYL ) 20 MG tablet Take 1 by mouth every 4-6 hours as needed for cramping 30 tablet 6   ELIQUIS  2.5 MG TABS tablet TAKE 1 TABLET BY MOUTH TWICE  DAILY 200 tablet 2   empagliflozin  (JARDIANCE ) 10 MG TABS tablet Take 1 tablet (10 mg total) by mouth daily before breakfast. 90 tablet 2   escitalopram  (LEXAPRO ) 5 MG tablet Take 1 tablet (5 mg total) by mouth every evening. 30 tablet 5   esomeprazole  (NEXIUM ) 40 MG capsule TAKE ONE CAPSULE ONCE DAILY 90 capsule 1   GABAPENTIN  EX Apply 1 Application topically 3 (three) times  daily as needed (To vulva). Medication: Gabapentin  6% cream     Homeopathic Products (ARNICARE) GEL Apply 1 application  topically daily as needed (skin bruising).     hyoscyamine  (LEVSIN ) 0.125 MG tablet TAKE 1-2 TABLETS EVERY 4 HOURS AS NEEDED FOR UP TO 10 DAYS FOR CRAMPING. 100 tablet 3   ipratropium (ATROVENT ) 0.06 % nasal spray Place 2 sprays into the nose 4 (four) times daily. 15 mL 5   lidocaine  (XYLOCAINE ) 5 % ointment Apply 1 Application topically 3 (three) times daily. Uses as needed three times a day for pain of the vulva. 1.25 g 1   LORazepam  (ATIVAN ) 1 MG tablet TAKE ONE TABLET BY MOUTH TWICE DAILY AS NEEDED FOR ANXIETY / SLEEP. 180 tablet 1   losartan  (COZAAR ) 25 MG tablet Take 1 tablet (25 mg total) by mouth daily. 90 tablet 3   meclizine  (ANTIVERT ) 12.5 MG tablet Take 1 tablet (12.5 mg total) by mouth 3 (three) times daily as needed for dizziness. 60 tablet 1   nitroGLYCERIN  (NITROSTAT ) 0.4 MG SL tablet Place 1 tablet (0.4 mg total) under the tongue every 5 (five) minutes as needed for chest pain (Call 911 at 3rd dose within 15 minutes.). 20 tablet 3   ondansetron  (ZOFRAN -ODT) 8 MG disintegrating tablet dissolve  1 tablet (8 mg total) in mouth every 8 (eight) hours as needed for nausea or vomiting. 12 tablet 3   Polyethyl Glycol-Propyl Glycol (LUBRICANT EYE DROPS) 0.4-0.3 % SOLN Place 1-2 drops into both eyes at bedtime.     polyethylene glycol (MIRALAX  / GLYCOLAX ) 17 g packet Take 17 g by mouth as needed for mild constipation or moderate constipation.     sodium chloride  (OCEAN) 0.65 % SOLN nasal spray Place 1 spray into both nostrils at bedtime as needed for congestion.     traMADol  (ULTRAM ) 50 MG tablet Take 1 tablet (50 mg total) by mouth every 6 (six) hours as needed. 30 tablet 0   No facility-administered medications prior to visit.    ROS: Review of Systems  Constitutional:  Negative for activity change, appetite change, chills, fatigue and unexpected weight change.  HENT:   Negative for congestion, mouth sores and sinus pressure.   Eyes:  Negative for visual disturbance.  Respiratory:  Positive for cough. Negative for chest tightness and shortness of breath.   Gastrointestinal:  Negative for abdominal pain and nausea.  Genitourinary:  Negative for difficulty urinating, frequency and vaginal pain.  Musculoskeletal:  Positive for arthralgias. Negative for back pain and gait problem.  Skin:  Negative for pallor and rash.  Neurological:  Negative for dizziness, tremors, weakness, numbness and headaches.  Psychiatric/Behavioral:  Negative for confusion and sleep disturbance.     Objective:  BP 110/78 (BP Location: Left Arm, Patient Position: Sitting, Cuff Size: Normal)   Pulse 86   Temp 98.2 F (36.8 C) (Oral)   Ht 5' 4 (1.626 m)   Wt 143 lb (64.9 kg)   SpO2 94%   BMI 24.55 kg/m   BP Readings from Last 3 Encounters:  09/24/23 110/78  08/19/23 118/80  07/23/23 116/86    Wt Readings from Last 3 Encounters:  09/24/23 143 lb (64.9 kg)  08/19/23 143 lb (64.9 kg)  07/23/23 140 lb (63.5 kg)    Physical Exam Constitutional:      General: She is not in acute distress.    Appearance: She is well-developed.  HENT:     Head: Normocephalic.     Right Ear: External ear normal.     Left Ear: External ear normal.     Nose: Nose normal.  Eyes:     General:        Right eye: No discharge.        Left eye: No discharge.     Conjunctiva/sclera: Conjunctivae normal.     Pupils: Pupils are equal, round, and reactive to light.  Neck:     Thyroid : No thyromegaly.     Vascular: No JVD.     Trachea: No tracheal deviation.  Cardiovascular:     Rate and Rhythm: Normal rate and regular rhythm.     Heart sounds: Normal heart sounds.  Pulmonary:     Effort: No respiratory distress.     Breath sounds: No stridor. No wheezing.  Abdominal:     General: Bowel sounds are normal. There is no distension.     Palpations: Abdomen is soft. There is no mass.      Tenderness: There is no abdominal tenderness. There is no guarding or rebound.  Musculoskeletal:        General: No tenderness.     Cervical back: Normal range of motion and neck supple. No rigidity.  Lymphadenopathy:     Cervical: No cervical adenopathy.  Skin:    Findings: No erythema  or rash.  Neurological:     Mental Status: She is oriented to person, place, and time.     Cranial Nerves: No cranial nerve deficit.     Motor: No abnormal muscle tone.     Coordination: Coordination normal.     Deep Tendon Reflexes: Reflexes normal.  Psychiatric:        Behavior: Behavior normal.        Thought Content: Thought content normal.        Judgment: Judgment normal.     Lab Results  Component Value Date   WBC 5.8 08/19/2023   HGB 13.3 08/19/2023   HCT 39.5 08/19/2023   PLT 189.0 08/19/2023   GLUCOSE 98 08/19/2023   CHOL 145 02/18/2023   TRIG 125.0 02/18/2023   HDL 63.60 02/18/2023   LDLDIRECT 89.1 06/29/2013   LDLCALC 57 02/18/2023   ALT 11 08/19/2023   AST 17 08/19/2023   NA 138 08/19/2023   K 4.5 08/19/2023   CL 104 08/19/2023   CREATININE 1.11 08/19/2023   BUN 26 (H) 08/19/2023   CO2 25 08/19/2023   TSH 1.685 02/07/2023   INR 1.2 02/07/2023   HGBA1C 5.5 02/08/2023    DG Cervical Spine 2 or 3 views Result Date: 07/29/2023 CLINICAL DATA:  Neck pain. EXAM: CERVICAL SPINE - 2-3 VIEW COMPARISON:  None Available. FINDINGS: Mild grade 1 anterolisthesis of C3-4 is noted secondary to posterior facet joint hypertrophy. Severe degenerative disc disease is noted at C4-5, C5-6, C6-7 and C7-T1. No definite fracture is noted. IMPRESSION: Severe multilevel degenerative disc disease. No acute abnormality seen. Electronically Signed   By: Lynwood Landy Raddle M.D.   On: 07/29/2023 09:45    Assessment & Plan:   Problem List Items Addressed This Visit     Permanent atrial fibrillation (HCC)   Low dose metoprolol , Eliquis       Cough   Post-COVID bronchitis Zpack Rx        Supraclavicular fossa fullness - Primary   Probable lung tip protrusion - L supraclavicular mass; CT is pending by Ortho, Dr Celena.  NT      COVID-19   08/2024 Paxlovid  Post covid  issues - Z pack        Relevant Medications   azithromycin  (ZITHROMAX  Z-PAK) 250 MG tablet   fluconazole  (DIFLUCAN ) 150 MG tablet      Meds ordered this encounter  Medications   azithromycin  (ZITHROMAX  Z-PAK) 250 MG tablet    Sig: As directed    Dispense:  6 tablet    Refill:  0   fluconazole  (DIFLUCAN ) 150 MG tablet    Sig: Take 1 tablet (150 mg total) by mouth once for 1 dose.    Dispense:  1 tablet    Refill:  1      Follow-up: Return in about 3 months (around 12/23/2023) for a follow-up visit.  Marolyn Noel, MD

## 2023-09-24 NOTE — Assessment & Plan Note (Signed)
 08/2024 Paxlovid Post covid  issues - Z pack

## 2023-09-24 NOTE — Assessment & Plan Note (Addendum)
 Post-COVID bronchitis Zpack Rx

## 2023-09-24 NOTE — Assessment & Plan Note (Signed)
 Probable lung tip protrusion - L supraclavicular mass; CT is pending by Ortho, Dr Carola Frost.  NT

## 2023-09-25 ENCOUNTER — Encounter: Payer: Self-pay | Admitting: Internal Medicine

## 2023-09-30 ENCOUNTER — Encounter: Payer: Self-pay | Admitting: Family Medicine

## 2023-09-30 ENCOUNTER — Ambulatory Visit: Payer: Medicare Other | Admitting: Family Medicine

## 2023-09-30 VITALS — BP 111/77 | Ht 64.0 in | Wt 143.0 lb

## 2023-09-30 DIAGNOSIS — Z7901 Long term (current) use of anticoagulants: Secondary | ICD-10-CM

## 2023-09-30 DIAGNOSIS — M25551 Pain in right hip: Secondary | ICD-10-CM | POA: Diagnosis not present

## 2023-09-30 MED ORDER — METHYLPREDNISOLONE ACETATE 40 MG/ML IJ SUSP
40.0000 mg | Freq: Once | INTRAMUSCULAR | Status: AC
  Administered 2023-09-30: 40 mg via INTRA_ARTICULAR

## 2023-09-30 NOTE — Patient Instructions (Signed)
Today you received an injection with a corticosteroid (aka: cortisone injection). This injection is usually done in response to pain and inflammation. There is some "numbing medicine" (Lidocaine) in the shot, so the injected area may be numb and feel really good for the next couple of hours. The numbing medicine usually wears off in 2-3 hours, and then your pain level may be back to where it was before the injection until the cortisone starts working.    The actually benefit from the steroid injection is usually noticed within 3-5 days, but may take up to 14 days. You may actually experience a small (as in 10%) INCREASE in pain in the first 24 hours---that is common.  Things to watch out for that you should contact us or a health care provider urgently would include: 1. Unusual (as in more than 10%) increase in pain 2. New fever > 101.5 3. New swelling or redness of the injected area. 4. Streaking of red lines around the area injected.  Do not hesitate to call or reach out with any questions or concerns.

## 2023-09-30 NOTE — Assessment & Plan Note (Signed)
Acute on chronic right lateral hip pain with greater trochanteric pain syndrome.  Likely aggravated by slight muscle imbalances around the hip and the core.  Plan: -Previous notes from prior visits with [place] reviewed today -Patient has done quite well with prior greater trochanteric cortisone injections.  Last was August 2024.  Hailey [name] is interested in this today.  This was completed today and Hailey [name] tolerated well.  PROCEDURE:  Risks & benefits of RT greater trochanteric cortisone injection reviewed. Consent obtained. Time-out completed. Patient prepped and draped in the normal fashion. Area cleansed with chlorhexidine. Ethyl chloride spray used to anesthetize the skin. Solution of 4 mL 1% lidocaine without epinephrine with 1 mL methylprednisolone (Depo-medrol) 40mg /mL injected into the RT greater trochanteric bursa using a 22-gauge 2-inch needle via the lateral approach over point of maximal tenderness. Patient tolerated procedure well without any complications. Area covered with adhesive bandage. Post-procedure care reviewed, all questions answered.  -Advised to go easy on activity for the next 2 to 3 days.  Hailey [name] has appointment with her personal trainer on Thursday, Hailey [name] can slowly start to resume activity at that time -Continue with Tylenol as needed -Continue with home exercises to strengthen around the hip and the core -Follow-up 4 to 6 weeks if no improvement.  If injection was not helpful, could consider having her follow-up for repeat lumbar epidural, as Hailey [name] does have underlying lumbar issues as well.

## 2023-09-30 NOTE — Assessment & Plan Note (Signed)
Chronic anticoagulation with Eliquis for chronic A-fib  Plan: -Completed right greater trochanteric bursal injection today.  Did advise may experience increased bruising in the area due to the injection.  Can apply ice as needed

## 2023-09-30 NOTE — Progress Notes (Unsigned)
GYNECOLOGY  VISIT   HPI: 89 y.o.   Married  Caucasian female   G2P2000 with No LMP recorded. Patient has had a hysterectomy.   here for: UTI??     GYNECOLOGIC HISTORY: No LMP recorded. Patient has had a hysterectomy. Contraception:  hyst Menopausal hormone therapy:  n/a Last 2 paps:  many years ago History of abnormal Pap or positive HPV:  no Mammogram: 05/30/23 Breast Density Cat C, BI-RADS CAT 1 neg        OB History     Gravida  2   Para  2   Term  2   Preterm  0   AB  0   Living         SAB  0   IAB  0   Ectopic  0   Multiple      Live Births                 Patient Active Problem List   Diagnosis Date Noted   Supraclavicular fossa fullness 09/24/2023   COVID-19 09/24/2023   Cough 06/28/2023   Degenerative tear of left medial meniscus 06/20/2023   History of skin cancer 05/20/2023   Melanocytic nevi of trunk 05/20/2023   Seborrheic keratoses 05/20/2023   Squamous cell carcinoma of skin of right lower extremity 05/20/2023   Mitral regurgitation 02/22/2023   Aneurysm of ascending aorta without rupture (HCC) 02/18/2023   Cellulitis of left leg 02/18/2023   Closed comminuted fracture of left humerus 02/07/2023   Bradycardia 02/07/2023   Fall 02/07/2023   Chronic a-fib (HCC) 02/07/2023   Statin intolerance 12/31/2022   Acute pain of right knee 12/06/2022   Impetigo 10/01/2022   Insomnia 10/01/2022   Osteoporosis 06/11/2022   Weakness 04/25/2022   Weakness generalized 04/25/2022   Falls frequently 04/19/2022   Iron deficiency anemia 12/08/2021   Skin ulcer, limited to breakdown of skin (HCC) 11/22/2021   Anemia, iron deficiency 11/22/2021   Low serum vitamin B12 11/22/2021   Acute renal failure superimposed on stage 2 chronic kidney disease (HCC) 11/20/2021   ABLA (acute blood loss anemia) 11/20/2021   Hypotension 11/19/2021   Shortness of breath 06/12/2021   Right leg pain 02/21/2021   Abnormal CT of the abdomen    Aortic  atherosclerosis (HCC) 08/27/2020   Acute prerenal azotemia 08/27/2020   Shock circulatory (HCC) 08/27/2020   Metabolic acidosis with normal anion gap and bicarbonate losses 08/27/2020   Abdominal cramps 03/16/2020   Neurogenic orthostatic hypotension (HCC) 09/16/2019   Greater trochanteric pain syndrome of right lower extremity 07/30/2019   Abdominal pain 05/22/2018   Bradycardia with 41-50 beats per minute 12/27/2017   Right bundle branch block (RBBB) on electrocardiogram (ECG) 12/27/2017   Chronic kidney disease, stage 3b (HCC) 12/24/2017   Cellulitis of leg, right with large prepatella hematoma and open wounds 12/24/2017   Iliotibial band syndrome of right side 12/16/2017   Intervertebral lumbar disc disorder with myelopathy, lumbar region 04/25/2017   Family history of colon cancer in mother 01/22/2017   Primary osteoarthritis of both first carpometacarpal joints 01/02/2017   Pain 11/21/2016   Gastroenteritis 09/13/2016   Chronic anticoagulation 05/24/2015   Coronary artery disease involving native coronary artery of native heart with angina pectoris (HCC) 12/20/2014   Permanent atrial fibrillation (HCC) 11/21/2012   Greater trochanteric bursitis of both hips 05/21/2012   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   Bruising 12/18/2010   Osteoarthritis of left hip 07/03/2010   DEGENERATIVE DISC DISEASE,  LUMBOSACRAL SPINE 05/18/2010   SYNCOPE 10/27/2008   IBS 05/21/2008   Essential hypertension 06/16/2007   GERD (gastroesophageal reflux disease) 06/16/2007    Past Medical History:  Diagnosis Date   Acute blood loss anemia 12/25/2017   Allergic rhinitis    Anxiety    Arthritis    "my whole spine" (07/01/2017)   Arthritis    Atrial fibrillation (HCC)    Bowel obstruction (HCC)    in [place]   Bradycardia with 41-50 beats per minute 12/27/2017   Cancer (HCC)    Cellulitis of leg, right with large prepatella hematoma and open wounds 12/26/2017   CKD (chronic kidney disease)  stage 2, GFR 60-89 ml/min    Colon polyps    Coronary artery disease    10/18 PCI/DES to p/m LCx with cutting balloon to mLcx   Diverticulosis of colon    GERD (gastroesophageal reflux disease)    Hip bursitis 2010   Dr Wyline Mood, Post op seroma   History of colon polyps    HSV (herpes simplex virus) anogenital infection 07/2019   HTN (hypertension)    IBS (irritable bowel syndrome)    constipation predominant - Dr Kinnie Scales   Lichen sclerosus    Osteopenia 11/2016   T score -2.0 FRAX 15%/4.3%   PAC (premature atrial contraction)    Symptomatiic   Renal insufficiency    Right bundle branch block (RBBB) on electrocardiogram (ECG) 12/27/2017   Scoliosis    SVT (supraventricular tachycardia) (HCC)    brief history   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   biopsy showing vulvar atypia, possible VIN I    Past Surgical History:  Procedure Laterality Date   ANTERIOR AND POSTERIOR VAGINAL REPAIR  01/2002   Hattie Perch 01/23/2011   APPENDECTOMY  1946   CARDIAC CATHETERIZATION  06/26/2017   CORONARY ANGIOPLASTY WITH STENT PLACEMENT  07/01/2017   CORONARY ATHERECTOMY N/A 07/01/2017   Procedure: CORONARY ATHERECTOMY;  Surgeon: Lyn Records, MD;  Location: [facility] INVASIVE CV [facility];  Service: Cardiovascular;  Laterality: N/A;   CORONARY STENT INTERVENTION N/A 07/01/2017   Procedure: CORONARY STENT INTERVENTION;  Surgeon: Lyn Records, MD;  Location: [facility];  Service: Cardiovascular;  Laterality: N/A;   HAMMER TOE SURGERY     HEMORRHOID BANDING     HIP SURGERY Left 04/2009   hip examination under anesthesia followed by greater trochanteric bursectomy; iliotibial band tenotomy/notes 01/20/2011   I & D EXTREMITY Right 01/10/2018   Procedure: IRRIGATION AND DEBRIDEMENT RIGHT KNEE, APPLY WOUND VAC;  Surgeon: Nadara Mustard, MD;  Location: [facility] OR;  Service: Orthopedics;  Laterality: Right;   KNEE BURSECTOMY Right 04/2009   Hattie Perch 01/09/2011   LEFT ATRIAL APPENDAGE OCCLUSION N/A  08/24/2021   Procedure: LEFT ATRIAL APPENDAGE OCCLUSION;  Surgeon: Tonny Bollman, MD;  Location[facility] INVASIVE CV LAB;  Service: Cardiovascular;  Laterality: N/A;   LEFT HEART CATH AND CORONARY ANGIOGRAPHY N/A 06/26/2017   Procedure: LEFT HEART CATH AND CORONARY ANGIOGRAPHY;  Surgeon: Lyn Records, MD;  Location: [facility];  Service: Cardiovascular;  Laterality: N/A;   PUBOVAGINAL SLING  01/2002   Hattie Perch 01/23/2011   REDUCTION MAMMAPLASTY     TEE WITHOUT CARDIOVERSION N/A 08/24/2021   Procedure: TRANSESOPHAGEAL ECHOCARDIOGRAM (TEE);  Surgeon: Tonny Bollman, MD;  Location: [facility] INVASIVE CV LAB;  Service: Cardiovascular;  Laterality: N/A;   TEMPORARY PACEMAKER N/A 07/01/2017   Procedure: TEMPORARY PACEMAKER;  Surgeon: Lyn Records, MD;  Location: [facility] INVASIVE CV LAB;  Service:  Cardiovascular;  Laterality: N/A;   VAGINAL HYSTERECTOMY  01/2002   Vaginal hysterectomy, bilateral salpingo-oophorectomy/notes 01/23/2011    Current Outpatient Medications  Medication Sig Dispense Refill   acetaminophen (TYLENOL) 500 MG tablet Take 1,000 mg by mouth every 6 (six) hours as needed (body aches / sore hip). Maximum of 6 tablets daily (3000mg )     albuterol (VENTOLIN HFA) 108 (90 Base) MCG/ACT inhaler Inhale 2 puffs into the lungs every 6 (six) hours as needed for wheezing or shortness of breath. 8 g 1   azithromycin (ZITHROMAX Z-PAK) 250 MG tablet As directed 6 tablet 0   BIOTIN PO Take 5,000 mcg by mouth in the morning.     clobetasol ointment (TEMOVATE) 0.05 % Apply to the affected area in a thin layer twice a day for 2 weeks as needed for a flare.  Then apply to the area twice a week at bedtime as maintenance dosing. 60 g 1   cyclobenzaprine (FLEXERIL) 10 MG tablet Take 1 tablet (10 mg total) by mouth 3 (three) times daily as needed for muscle spasms. 30 tablet 0   diclofenac Sodium (VOLTAREN) 1 % GEL Apply 2 g topically 4 (four) times daily as needed (back pain.).     dicyclomine (BENTYL) 20 MG  tablet Take 1 by mouth every 4-6 hours as needed for cramping 30 tablet 6   ELIQUIS 2.5 MG TABS tablet TAKE 1 TABLET BY MOUTH TWICE  DAILY 200 tablet 2   empagliflozin (JARDIANCE) 10 MG TABS tablet Take 1 tablet (10 mg total) by mouth daily before breakfast. 90 tablet 2   escitalopram (LEXAPRO) 5 MG tablet Take 1 tablet (5 mg total) by mouth every evening. 30 tablet 5   esomeprazole (NEXIUM) 40 MG capsule TAKE ONE CAPSULE ONCE DAILY 90 capsule 1   GABAPENTIN EX Apply 1 Application topically 3 (three) times daily as needed (To vulva). Medication: Gabapentin 6% cream     Homeopathic Products (ARNICARE) GEL Apply 1 application  topically daily as needed (skin bruising).     hyoscyamine (LEVSIN) 0.125 MG tablet TAKE 1-2 TABLETS EVERY 4 HOURS AS NEEDED FOR UP TO 10 DAYS FOR CRAMPING. 100 tablet 3   ipratropium (ATROVENT) 0.06 % nasal spray Place 2 sprays into the nose 4 (four) times daily. 15 mL 5   lidocaine (XYLOCAINE) 5 % ointment Apply 1 Application topically 3 (three) times daily. Uses as needed three times a day for pain of the vulva. 1.25 g 1   LORazepam (ATIVAN) 1 MG tablet TAKE ONE TABLET BY MOUTH TWICE DAILY AS NEEDED FOR ANXIETY / SLEEP. 180 tablet 1   losartan (COZAAR) 25 MG tablet Take 1 tablet (25 mg total) by mouth daily. 90 tablet 3   meclizine (ANTIVERT) 12.5 MG tablet Take 1 tablet (12.5 mg total) by mouth 3 (three) times daily as needed for dizziness. 60 tablet 1   nitroGLYCERIN (NITROSTAT) 0.4 MG SL tablet Place 1 tablet (0.4 mg total) under the tongue every 5 (five) minutes as needed for chest pain (Call 911 at 3rd dose within 15 minutes.). 20 tablet 3   ondansetron (ZOFRAN-ODT) 8 MG disintegrating tablet dissolve 1 tablet (8 mg total) in mouth every 8 (eight) hours as needed for nausea or vomiting. 12 tablet 3   Polyethyl Glycol-Propyl Glycol (LUBRICANT EYE DROPS) 0.4-0.3 % SOLN Place 1-2 drops into both eyes at bedtime.     polyethylene glycol (MIRALAX / GLYCOLAX) 17 g packet Take  17 g by mouth as needed for mild constipation or  moderate constipation.     sodium chloride (OCEAN) 0.65 % SOLN nasal spray Place 1 spray into both nostrils at bedtime as needed for congestion.     traMADol (ULTRAM) 50 MG tablet Take 1 tablet (50 mg total) by mouth every 6 (six) hours as needed. 30 tablet 0   No current facility-administered medications for this visit.     ALLERGIES: Macrobid [nitrofurantoin monohyd macro], Meloxicam, Digoxin and related, Diprolene [betamethasone dipropionate aug], Ferrous sulfate, Keflex [cephalexin], Lipitor [atorvastatin], Mobic [meloxicam], and Sulfamethoxazole-trimethoprim  Family History  Problem Relation Age of Onset   Colon cancer Mother        Dx age 38, died at age 6   Diabetes Father    Prostate cancer Father    Prostate cancer Brother    Pancreatic cancer Brother    Stomach cancer Son     Social History   Socioeconomic History   Marital status: Married    Spouse name: [name]   Number of children: 2   Years of education: Not on file   Highest education level: Bachelor's degree (e.g., BA, AB, BS)  Occupational History   Occupation: Engineer, manufacturing systems: RETIRED  Tobacco Use   Smoking status: Never   Smokeless tobacco: Never  Vaping Use   Vaping status: Never Used  Substance and Sexual Activity   Alcohol use: Never    Comment: 7 vodka drinks a week   Drug use: Never   Sexual activity: Not Currently    Birth control/protection: Surgical    Comment: hysterectomy; less than 5, IC after 18, no STD, no abnormal paps  Other Topics Concern   Not on file  Social History Narrative   ** Merged History Encounter **       Regular Exercise -  YES         Social Drivers of Health   Financial Resource Strain: Low Risk  (02/26/2023)   Overall Financial Resource Strain (CARDIA)    Difficulty of Paying Living Expenses: Not hard at all  Food Insecurity: No Food Insecurity (02/26/2023)   Hunger Vital Sign    Worried About Running  Out of Food in the Last Year: Never true    Ran Out of Food in the Last Year: Never true  Transportation Needs: No Transportation Needs (02/26/2023)   PRAPARE - Administrator, Civil Service (Medical): No    Lack of Transportation (Non-Medical): No  Physical Activity: Insufficiently Active (02/26/2023)   Exercise Vital Sign    Days of Exercise per Week: 3 days    Minutes of Exercise per Session: 40 min  Stress: Stress Concern Present (02/26/2023)   [employer] of Occupational Health - Occupational Stress Questionnaire    Feeling of Stress : To some extent  Social Connections: Moderately Integrated (02/26/2023)   Social Connection and Isolation Panel [NHANES]    Frequency of Communication with Friends and Family: More than three times a week    Frequency of Social Gatherings with Friends and Family: More than three times a week    Attends Religious Services: Never    Database administrator or Organizations: Yes    Attends Engineer, structural: More than 4 times per year    Marital Status: Married  Catering manager Violence: Not At Risk (02/26/2023)   Humiliation, Afraid, Rape, and Kick questionnaire    Fear of Current or Ex-Partner: No    Emotionally Abused: No    Physically Abused: No    Sexually  Abused: No    Review of Systems  PHYSICAL EXAMINATION:   There were no vitals taken for this visit.    General appearance: alert, cooperative and appears stated age Head: Normocephalic, without obvious abnormality, atraumatic Neck: no adenopathy, supple, symmetrical, trachea midline and thyroid normal to inspection and palpation Lungs: clear to auscultation bilaterally Breasts: normal appearance, no masses or tenderness, No nipple retraction or dimpling, No nipple discharge or bleeding, No axillary or supraclavicular adenopathy Heart: regular rate and rhythm Abdomen: soft, non-tender, no masses,  no organomegaly Extremities: extremities normal, atraumatic, no  cyanosis or edema Skin: Skin color, texture, turgor normal. No rashes or lesions Lymph nodes: Cervical, supraclavicular, and axillary nodes normal. No abnormal inguinal nodes palpated Neurologic: Grossly normal  Pelvic: External genitalia:  no lesions              Urethra:  normal appearing urethra with no masses, tenderness or lesions              Bartholins and Skenes: normal                 Vagina: normal appearing vagina with normal color and discharge, no lesions              Cervix: no lesions                Bimanual Exam:  Uterus:  normal size, contour, position, consistency, mobility, non-tender              Adnexa: no mass, fullness, tenderness              Rectal exam: {yes no:314532}.  Confirms.              Anus:  normal sphincter tone, no lesions  Chaperone was present for exam:  {BSCHAPERONE:31226::"Emily F, CMA"}  ASSESSMENT:    PLAN:    {LABS (Optional):23779}  ***  total time was spent for this patient encounter, including preparation, face-to-face counseling with the patient, coordination of care, and documentation of the encounter.

## 2023-09-30 NOTE — Progress Notes (Signed)
DATE OF VISIT: 09/30/2023        Hailey [name] DOB: [birth year] MRN: [id number]  CC:  f/u Rt hip   History of present Illness: Hailey [name] is a 89 y.o. female who presents for a follow-up visit Last seen by me 10/10/254 for Lt knee pain Was seen by Dr Jettie Booze 04/24/23 for Rt hip pain with IT band syndrome - underwent CSI with Dr Darrick Penna at that time along the Rt greater trochanter - had very good response & good relief from injection - was given hip exercises as well  Today she reports starting to get increased pain over the last few weeks Working with personal trainer to build up strength in the legs - feels good during workout - next day has increasing pain in the right lateral hip Pain starts along the lateral hip and radiates down lateral right leg - stops at the knee Pain worst at the lateral hip along greater trochanter No numbness/tingling Previous Rt greater troch injections 04/24/23, 03/08/22, 11/03/20 with good response. Lt knee is starting to be bothersome as well - she thinks due to altered gait and compensating for the right hip Taking Tylenol 1000mg  tid prn - mainly for the shoulder, but also for the hip Needs to make self comfortable at night when sleeping Likes to read in bed - sometimes bothersome when reading in bed  Has known lumbar issues as well - previous lumbar epidural about 3 months ago - current symptoms feel different than her typical back symptoms  Medications:  Outpatient Encounter Medications as of 09/30/2023  Medication Sig   acetaminophen (TYLENOL) 500 MG tablet Take 1,000 mg by mouth every 6 (six) hours as needed (body aches / sore hip). Maximum of 6 tablets daily (3000mg )   albuterol (VENTOLIN HFA) 108 (90 Base) MCG/ACT inhaler Inhale 2 puffs into the lungs every 6 (six) hours as needed for wheezing or shortness of breath.   azithromycin (ZITHROMAX Z-PAK) 250 MG tablet As directed   BIOTIN PO Take 5,000 mcg by mouth in the morning.   clobetasol  ointment (TEMOVATE) 0.05 % Apply to the affected area in a thin layer twice a day for 2 weeks as needed for a flare.  Then apply to the area twice a week at bedtime as maintenance dosing.   cyclobenzaprine (FLEXERIL) 10 MG tablet Take 1 tablet (10 mg total) by mouth 3 (three) times daily as needed for muscle spasms.   diclofenac Sodium (VOLTAREN) 1 % GEL Apply 2 g topically 4 (four) times daily as needed (back pain.).   dicyclomine (BENTYL) 20 MG tablet Take 1 by mouth every 4-6 hours as needed for cramping   ELIQUIS 2.5 MG TABS tablet TAKE 1 TABLET BY MOUTH TWICE  DAILY   empagliflozin (JARDIANCE) 10 MG TABS tablet Take 1 tablet (10 mg total) by mouth daily before breakfast.   escitalopram (LEXAPRO) 5 MG tablet Take 1 tablet (5 mg total) by mouth every evening.   esomeprazole (NEXIUM) 40 MG capsule TAKE ONE CAPSULE ONCE DAILY   GABAPENTIN EX Apply 1 Application topically 3 (three) times daily as needed (To vulva). Medication: Gabapentin 6% cream   Homeopathic Products (ARNICARE) GEL Apply 1 application  topically daily as needed (skin bruising).   hyoscyamine (LEVSIN) 0.125 MG tablet TAKE 1-2 TABLETS EVERY 4 HOURS AS NEEDED FOR UP TO 10 DAYS FOR CRAMPING.   ipratropium (ATROVENT) 0.06 % nasal spray Place 2 sprays into the nose 4 (four) times daily.   lidocaine (XYLOCAINE) 5 %  ointment Apply 1 Application topically 3 (three) times daily. Uses as needed three times a day for pain of the vulva.   LORazepam (ATIVAN) 1 MG tablet TAKE ONE TABLET BY MOUTH TWICE DAILY AS NEEDED FOR ANXIETY / SLEEP.   losartan (COZAAR) 25 MG tablet Take 1 tablet (25 mg total) by mouth daily.   meclizine (ANTIVERT) 12.5 MG tablet Take 1 tablet (12.5 mg total) by mouth 3 (three) times daily as needed for dizziness.   nitroGLYCERIN (NITROSTAT) 0.4 MG SL tablet Place 1 tablet (0.4 mg total) under the tongue every 5 (five) minutes as needed for chest pain (Call 911 at 3rd dose within 15 minutes.).   ondansetron (ZOFRAN-ODT) 8 MG  disintegrating tablet dissolve 1 tablet (8 mg total) in mouth every 8 (eight) hours as needed for nausea or vomiting.   Polyethyl Glycol-Propyl Glycol (LUBRICANT EYE DROPS) 0.4-0.3 % SOLN Place 1-2 drops into both eyes at bedtime.   polyethylene glycol (MIRALAX / GLYCOLAX) 17 g packet Take 17 g by mouth as needed for mild constipation or moderate constipation.   sodium chloride (OCEAN) 0.65 % SOLN nasal spray Place 1 spray into both nostrils at bedtime as needed for congestion.   traMADol (ULTRAM) 50 MG tablet Take 1 tablet (50 mg total) by mouth every 6 (six) hours as needed.   [EXPIRED] methylPREDNISolone acetate (DEPO-MEDROL) injection 40 mg    No facility-administered encounter medications on file as of 09/30/2023.    Allergies: is allergic to macrobid [nitrofurantoin monohyd macro], meloxicam, digoxin and related, diprolene [betamethasone dipropionate aug], ferrous sulfate, keflex [cephalexin], lipitor [atorvastatin], mobic [meloxicam], and sulfamethoxazole-trimethoprim.  Physical Examination: Vitals: BP 111/77   Ht 5\' 4"  (1.626 m)   Wt 143 lb (64.9 kg)   BMI 24.55 kg/m  GENERAL:  Hailey [name] is a 89 y.o. female appearing their stated age, alert and oriented x 3, in no apparent distress.  SKIN: no rashes or lesions, skin clean, dry, intact MSK: Lumbar spine with good range of motion with slight pain at terminal extension.  No midline or paraspinal tenderness.  Negative straight leg raise bilaterally.  Mild right lateral hip pain with FABER.  Negative FADIR. Right hip with good range of motion with pain along the greater trochanter at terminal internal and external rotation.  Tender to palpation over the greater trochanter.  No tenderness over the anterior or posterior hip.  Positive FABER on the right, negative FADIR.  Hip strength 5-/5 throughout.  Left hip with full range of motion without pain.  Negative FABER, negative FADIR.  Hip strength 5 -/5 throughout. No leg length  difference. Walking without a limp NEURO: sensation intact to light touch, DTR 2/4 Achilles, patella bilaterally VASC: pulses 2+ and symmetric DP/PT bilaterally, no edema  Assessment & Plan Greater trochanteric pain syndrome of right lower extremity Acute on chronic right lateral hip pain with greater trochanteric pain syndrome.  Likely aggravated by slight muscle imbalances around the hip and the core.  Plan: -Previous notes from prior visits with [place] reviewed today -Patient has done quite well with prior greater trochanteric cortisone injections.  Last was August 2024.  She is interested in this today.  This was completed today and she tolerated well.  PROCEDURE:  Risks & benefits of RT greater trochanteric cortisone injection reviewed. Consent obtained. Time-out completed. Patient prepped and draped in the normal fashion. Area cleansed with chlorhexidine. Ethyl chloride spray used to anesthetize the skin. Solution of 4 mL 1% lidocaine without epinephrine with 1 mL methylprednisolone (  Depo-medrol) 40mg /mL injected into the RT greater trochanteric bursa using a 22-gauge 2-inch needle via the lateral approach over point of maximal tenderness. Patient tolerated procedure well without any complications. Area covered with adhesive bandage. Post-procedure care reviewed, all questions answered.  -Advised to go easy on activity for the next 2 to 3 days.  She has appointment with her personal trainer on Thursday, she can slowly start to resume activity at that time -Continue with Tylenol as needed -Continue with home exercises to strengthen around the hip and the core -Follow-up 4 to 6 weeks if no improvement.  If injection was not helpful, could consider having her follow-up for repeat lumbar epidural, as she does have underlying lumbar issues as well.  Chronic anticoagulation Chronic anticoagulation with Eliquis for chronic A-fib  Plan: -Completed right greater trochanteric bursal injection today.   Did advise may experience increased bruising in the area due to the injection.  Can apply ice as needed  Patient expressed understanding & agreement with above.  Encounter Diagnoses  Name Primary?   Greater trochanteric pain syndrome of right lower extremity Yes   Chronic anticoagulation     No orders of the defined types were placed in this encounter.

## 2023-10-01 ENCOUNTER — Ambulatory Visit: Payer: Medicare Other | Admitting: Obstetrics and Gynecology

## 2023-10-01 ENCOUNTER — Encounter: Payer: Self-pay | Admitting: Obstetrics and Gynecology

## 2023-10-01 ENCOUNTER — Telehealth: Payer: Self-pay | Admitting: Obstetrics and Gynecology

## 2023-10-01 ENCOUNTER — Other Ambulatory Visit: Payer: Self-pay | Admitting: Obstetrics and Gynecology

## 2023-10-01 VITALS — BP 128/82 | HR 52 | Ht 64.0 in | Wt 143.0 lb

## 2023-10-01 DIAGNOSIS — R300 Dysuria: Secondary | ICD-10-CM | POA: Diagnosis not present

## 2023-10-01 DIAGNOSIS — R350 Frequency of micturition: Secondary | ICD-10-CM | POA: Diagnosis not present

## 2023-10-01 DIAGNOSIS — L900 Lichen sclerosus et atrophicus: Secondary | ICD-10-CM | POA: Diagnosis not present

## 2023-10-01 MED ORDER — AMOXICILLIN-POT CLAVULANATE 500-125 MG PO TABS: 1.0000 | ORAL_TABLET | Freq: Two times a day (BID) | ORAL | 0 refills | Status: AC

## 2023-10-01 NOTE — Telephone Encounter (Signed)
Patient notified

## 2023-10-01 NOTE — Telephone Encounter (Signed)
Spoke with patient, advised per Dr. Edward Jolly. Patient agreeable to proceed with abx, is uncertain if she has taken Augmentin in the past. Patient states she has been treated for UTI in the past, just uncertain on what medication, but did ok.   Confirmed pharmacy on file.   Will review with Dr. Edward Jolly and f/u prior to sending Rx. Patient agreeable.   Per review of EPIC results dated 11/13/22, tx with Augmentin for UTI.   Dr. Edward Jolly -please review. Contraindication with Augmentin and cephalexin.

## 2023-10-01 NOTE — Telephone Encounter (Signed)
Please let patient know that her preliminary urinalysis shows signs of infection.   The final urine culture may take up to 3 days to process.   If she would like to start an antibiotic before the culture if final, I recommend Augmentin 500 mg/125 mg twice daily for 5 days.   She may multiple allergies, which look like intolerances, on her allergy list.

## 2023-10-01 NOTE — Telephone Encounter (Signed)
Please inform patient that I sent through the prescription for Augmentin 500 mg/125 mg by mouth twice daily for 5 days to [facility].   Patient has taken Augmentin in the past without a problem.

## 2023-10-01 NOTE — Telephone Encounter (Signed)
See refill encounter, patient notified.   Encounter closed.

## 2023-10-04 LAB — URINALYSIS, COMPLETE W/RFL CULTURE
Bilirubin Urine: NEGATIVE
Glucose, UA: NEGATIVE
Hyaline Cast: NONE SEEN /[LPF]
Ketones, ur: NEGATIVE
Nitrites, Initial: NEGATIVE
Protein, ur: NEGATIVE
Specific Gravity, Urine: 1.015 (ref 1.001–1.035)
pH: 7 (ref 5.0–8.0)

## 2023-10-04 LAB — CULTURE INDICATED

## 2023-10-04 LAB — URINE CULTURE
MICRO NUMBER:: 15981203
SPECIMEN QUALITY:: ADEQUATE

## 2023-10-07 ENCOUNTER — Ambulatory Visit
Admission: RE | Admit: 2023-10-07 | Discharge: 2023-10-07 | Disposition: A | Discharge status: Home / Self Care | Payer: Medicare Other | Source: Ambulatory Visit | Attending: Orthopedic Surgery | Admitting: Orthopedic Surgery

## 2023-10-07 DIAGNOSIS — S42212D Unspecified displaced fracture of surgical neck of left humerus, subsequent encounter for fracture with routine healing: Secondary | ICD-10-CM

## 2023-10-07 DIAGNOSIS — M19012 Primary osteoarthritis, left shoulder: Secondary | ICD-10-CM | POA: Diagnosis not present

## 2023-10-07 DIAGNOSIS — S42292A Other displaced fracture of upper end of left humerus, initial encounter for closed fracture: Secondary | ICD-10-CM | POA: Diagnosis not present

## 2023-10-07 DIAGNOSIS — M25512 Pain in left shoulder: Secondary | ICD-10-CM | POA: Diagnosis not present

## 2023-10-07 DIAGNOSIS — G8929 Other chronic pain: Secondary | ICD-10-CM

## 2023-10-07 MED ORDER — IOPAMIDOL (ISOVUE-M 200) INJECTION 41%
10.0000 mL | Freq: Once | INTRAMUSCULAR | Status: AC
  Administered 2023-10-07: 10 mL via INTRA_ARTICULAR

## 2023-10-09 ENCOUNTER — Telehealth: Payer: Self-pay

## 2023-10-09 NOTE — Progress Notes (Unsigned)
GYNECOLOGY  VISIT   HPI: 89 y.o.   Married  {RACE/ETHNICITY:22866} female   G2P2000 with No LMP recorded. Patient has had a hysterectomy.   here for: ***     GYNECOLOGIC HISTORY: No LMP recorded. Patient has had a hysterectomy. Contraception:  *** Menopausal hormone therapy:  *** Last 2 paps:  *** History of abnormal Pap or positive HPV:  {YES NO:22349} Mammogram:  ***        OB History     Gravida  2   Para  2   Term  2   Preterm  0   AB  0   Living         SAB  0   IAB  0   Ectopic  0   Multiple      Live Births                 Patient Active Problem List   Diagnosis Date Noted   Supraclavicular fossa fullness 09/24/2023   COVID-19 09/24/2023   Cough 06/28/2023   Degenerative tear of left medial meniscus 06/20/2023   History of skin cancer 05/20/2023   Melanocytic nevi of trunk 05/20/2023   Seborrheic keratoses 05/20/2023   Squamous cell carcinoma of skin of right lower extremity 05/20/2023   Mitral regurgitation 02/22/2023   Aneurysm of ascending aorta without rupture (HCC) 02/18/2023   Cellulitis of left leg 02/18/2023   Closed comminuted fracture of left humerus 02/07/2023   Bradycardia 02/07/2023   Fall 02/07/2023   Chronic a-fib (HCC) 02/07/2023   Statin intolerance 12/31/2022   Acute pain of right knee 12/06/2022   Impetigo 10/01/2022   Insomnia 10/01/2022   Osteoporosis 06/11/2022   Weakness 04/25/2022   Weakness generalized 04/25/2022   Falls frequently 04/19/2022   Iron deficiency anemia 12/08/2021   Skin ulcer, limited to breakdown of skin (HCC) 11/22/2021   Anemia, iron deficiency 11/22/2021   Low serum vitamin B12 11/22/2021   Acute renal failure superimposed on stage 2 chronic kidney disease (HCC) 11/20/2021   ABLA (acute blood loss anemia) 11/20/2021   Hypotension 11/19/2021   Shortness of breath 06/12/2021   Right leg pain 02/21/2021   Abnormal CT of the abdomen    Aortic atherosclerosis (HCC) 08/27/2020   Acute  prerenal azotemia 08/27/2020   Shock circulatory (HCC) 08/27/2020   Metabolic acidosis with normal anion gap and bicarbonate losses 08/27/2020   Abdominal cramps 03/16/2020   Neurogenic orthostatic hypotension (HCC) 09/16/2019   Greater trochanteric pain syndrome of right lower extremity 07/30/2019   Abdominal pain 05/22/2018   Bradycardia with 41-50 beats per minute 12/27/2017   Right bundle branch block (RBBB) on electrocardiogram (ECG) 12/27/2017   Chronic kidney disease, stage 3b (HCC) 12/24/2017   Cellulitis of leg, right with large prepatella hematoma and open wounds 12/24/2017   Iliotibial band syndrome of right side 12/16/2017   Intervertebral lumbar disc disorder with myelopathy, lumbar region 04/25/2017   Family history of colon cancer in mother 01/22/2017   Primary osteoarthritis of both first carpometacarpal joints 01/02/2017   Pain 11/21/2016   Gastroenteritis 09/13/2016   Chronic anticoagulation 05/24/2015   Coronary artery disease involving native coronary artery of native heart with angina pectoris (HCC) 12/20/2014   Permanent atrial fibrillation (HCC) 11/21/2012   Greater trochanteric bursitis of both hips 05/21/2012   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   Bruising 12/18/2010   Osteoarthritis of left hip 07/03/2010   DEGENERATIVE DISC DISEASE, LUMBOSACRAL SPINE 05/18/2010   SYNCOPE 10/27/2008  IBS 05/21/2008   Essential hypertension 06/16/2007   GERD (gastroesophageal reflux disease) 06/16/2007    Past Medical History:  Diagnosis Date   Acute blood loss anemia 12/25/2017   Allergic rhinitis    Anxiety    Arthritis    "my whole spine" (07/01/2017)   Arthritis    Atrial fibrillation (HCC)    Bowel obstruction (HCC)    in [place]   Bradycardia with 41-50 beats per minute 12/27/2017   Cancer (HCC)    Cellulitis of leg, right with large prepatella hematoma and open wounds 12/26/2017   CKD (chronic kidney disease) stage 2, GFR 60-89 ml/min    Colon polyps     Coronary artery disease    10/18 PCI/DES to p/m LCx with cutting balloon to mLcx   Diverticulosis of colon    GERD (gastroesophageal reflux disease)    Hip bursitis 2010   Dr Wyline Mood, Post op seroma   History of colon polyps    HSV (herpes simplex virus) anogenital infection 07/2019   HTN (hypertension)    IBS (irritable bowel syndrome)    constipation predominant - Dr Kinnie Scales   Lichen sclerosus    Osteopenia 11/2016   T score -2.0 FRAX 15%/4.3%   PAC (premature atrial contraction)    Symptomatiic   Renal insufficiency    Right bundle branch block (RBBB) on electrocardiogram (ECG) 12/27/2017   Scoliosis    SVT (supraventricular tachycardia) (HCC)    brief history   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   biopsy showing vulvar atypia, possible VIN I    Past Surgical History:  Procedure Laterality Date   ANTERIOR AND POSTERIOR VAGINAL REPAIR  01/2002   Hattie Perch 01/23/2011   APPENDECTOMY  1946   CARDIAC CATHETERIZATION  06/26/2017   CORONARY ANGIOPLASTY WITH STENT PLACEMENT  07/01/2017   CORONARY ATHERECTOMY N/A 07/01/2017   Procedure: CORONARY ATHERECTOMY;  Surgeon: Lyn Records, MD;  Location: [facility] INVASIVE CV LAB;  Service: Cardiovascular;  Laterality: N/A;   CORONARY STENT INTERVENTION N/A 07/01/2017   Procedure: CORONARY STENT INTERVENTION;  Surgeon: Lyn Records, MD;  Location: [facility];  Service: Cardiovascular;  Laterality: N/A;   HAMMER TOE SURGERY     HEMORRHOID BANDING     HIP SURGERY Left 04/2009   hip examination under anesthesia followed by greater trochanteric bursectomy; iliotibial band tenotomy/notes 01/20/2011   I & D EXTREMITY Right 01/10/2018   Procedure: IRRIGATION AND DEBRIDEMENT RIGHT KNEE, APPLY WOUND VAC;  Surgeon: Nadara Mustard, MD;  Location: [facility] OR;  Service: Orthopedics;  Laterality: Right;   KNEE BURSECTOMY Right 04/2009   Hattie Perch 01/09/2011   LEFT ATRIAL APPENDAGE OCCLUSION N/A 08/24/2021   Procedure: LEFT ATRIAL APPENDAGE  OCCLUSION;  Surgeon: Tonny Bollman, MD;  Location: [facility] INVASIVE CV LAB;  Service: Cardiovascular;  Laterality: N/A;   LEFT HEART CATH AND CORONARY ANGIOGRAPHY N/A 06/26/2017   Procedure: LEFT HEART CATH AND CORONARY ANGIOGRAPHY;  Surgeon: Lyn Records, MD;  Location: [facility] INVASIVE CV [facility];  Service: Cardiovascular;  Laterality: N/A;   PUBOVAGINAL SLING  01/2002   Hattie Perch 01/23/2011   REDUCTION MAMMAPLASTY     TEE WITHOUT CARDIOVERSION N/A 08/24/2021   Procedure: TRANSESOPHAGEAL ECHOCARDIOGRAM (TEE);  Surgeon: Tonny Bollman, MD;  Location: [facility] INVASIVE CV LAB;  Service: Cardiovascular;  Laterality: N/A;   TEMPORARY PACEMAKER N/A 07/01/2017   Procedure: TEMPORARY PACEMAKER;  Surgeon: Lyn Records, MD;  Location: [facility] INVASIVE CV LAB;  Service: Cardiovascular;  Laterality: N/A;   VAGINAL HYSTERECTOMY  01/2002   Vaginal hysterectomy, bilateral salpingo-oophorectomy/notes 01/23/2011    Current Outpatient Medications  Medication Sig Dispense Refill   acetaminophen (TYLENOL) 500 MG tablet Take 1,000 mg by mouth every 6 (six) hours as needed (body aches / sore hip). Maximum of 6 tablets daily (3000mg )     albuterol (VENTOLIN HFA) 108 (90 Base) MCG/ACT inhaler Inhale 2 puffs into the lungs every 6 (six) hours as needed for wheezing or shortness of breath. 8 g 1   azithromycin (ZITHROMAX Z-PAK) 250 MG tablet As directed 6 tablet 0   BIOTIN PO Take 5,000 mcg by mouth in the morning.     clobetasol ointment (TEMOVATE) 0.05 % Apply to the affected area in a thin layer twice a day for 2 weeks as needed for a flare.  Then apply to the area twice a week at bedtime as maintenance dosing. 60 g 1   cyclobenzaprine (FLEXERIL) 10 MG tablet Take 1 tablet (10 mg total) by mouth 3 (three) times daily as needed for muscle spasms. 30 tablet 0   diclofenac Sodium (VOLTAREN) 1 % GEL Apply 2 g topically 4 (four) times daily as needed (back pain.).     dicyclomine (BENTYL) 20 MG tablet Take 1 by mouth every 4-6 hours as  needed for cramping 30 tablet 6   ELIQUIS 2.5 MG TABS tablet TAKE 1 TABLET BY MOUTH TWICE  DAILY 200 tablet 2   empagliflozin (JARDIANCE) 10 MG TABS tablet Take 1 tablet (10 mg total) by mouth daily before breakfast. 90 tablet 2   escitalopram (LEXAPRO) 5 MG tablet Take 1 tablet (5 mg total) by mouth every evening. 30 tablet 5   esomeprazole (NEXIUM) 40 MG capsule TAKE ONE CAPSULE ONCE DAILY 90 capsule 1   GABAPENTIN EX Apply 1 Application topically 3 (three) times daily as needed (To vulva). Medication: Gabapentin 6% cream     Homeopathic Products (ARNICARE) GEL Apply 1 application  topically daily as needed (skin bruising).     hyoscyamine (LEVSIN) 0.125 MG tablet TAKE 1-2 TABLETS EVERY 4 HOURS AS NEEDED FOR UP TO 10 DAYS FOR CRAMPING. 100 tablet 3   ipratropium (ATROVENT) 0.06 % nasal spray Place 2 sprays into the nose 4 (four) times daily. 15 mL 5   lidocaine (XYLOCAINE) 5 % ointment Apply 1 Application topically 3 (three) times daily. Uses as needed three times a day for pain of the vulva. 1.25 g 1   LORazepam (ATIVAN) 1 MG tablet TAKE ONE TABLET BY MOUTH TWICE DAILY AS NEEDED FOR ANXIETY / SLEEP. 180 tablet 1   losartan (COZAAR) 25 MG tablet Take 1 tablet (25 mg total) by mouth daily. 90 tablet 3   nitroGLYCERIN (NITROSTAT) 0.4 MG SL tablet Place 1 tablet (0.4 mg total) under the tongue every 5 (five) minutes as needed for chest pain (Call 911 at 3rd dose within 15 minutes.). 20 tablet 3   ondansetron (ZOFRAN-ODT) 8 MG disintegrating tablet dissolve 1 tablet (8 mg total) in mouth every 8 (eight) hours as needed for nausea or vomiting. 12 tablet 3   Polyethyl Glycol-Propyl Glycol (LUBRICANT EYE DROPS) 0.4-0.3 % SOLN Place 1-2 drops into both eyes at bedtime.     polyethylene glycol (MIRALAX / GLYCOLAX) 17 g packet Take 17 g by mouth as needed for mild constipation or moderate constipation.     sodium chloride (OCEAN) 0.65 % SOLN nasal spray Place 1 spray into both nostrils at bedtime as needed  for congestion.     traMADol (ULTRAM) 50 MG tablet  Take 1 tablet (50 mg total) by mouth every 6 (six) hours as needed. 30 tablet 0   No current facility-administered medications for this visit.     ALLERGIES: Macrobid [nitrofurantoin monohyd macro], Meloxicam, Digoxin and related, Diprolene [betamethasone dipropionate aug], Ferrous sulfate, Keflex [cephalexin], Lipitor [atorvastatin], Mobic [meloxicam], and Sulfamethoxazole-trimethoprim  Family History  Problem Relation Age of Onset   Colon cancer Mother        Dx age 79, died at age 52   Diabetes Father    Prostate cancer Father    Prostate cancer Brother    Pancreatic cancer Brother    Stomach cancer Son     Social History   Socioeconomic History   Marital status: Married    Spouse name: [name]   Number of children: 2   Years of education: Not on file   Highest education level: Bachelor's degree (e.g., BA, AB, BS)  Occupational History   Occupation: Engineer, manufacturing systems: RETIRED  Tobacco Use   Smoking status: Never   Smokeless tobacco: Never  Vaping Use   Vaping status: Never Used  Substance and Sexual Activity   Alcohol use: Never    Comment: 7 vodka drinks a week   Drug use: Never   Sexual activity: Not Currently    Birth control/protection: Surgical    Comment: hysterectomy; less than 5, IC after 18, no STD, no abnormal paps  Other Topics Concern   Not on file  Social History Narrative   ** Merged History Encounter **       Regular Exercise -  YES         Social Drivers of Health   Financial Resource Strain: Low Risk  (02/26/2023)   Overall Financial Resource Strain (CARDIA)    Difficulty of Paying Living Expenses: Not hard at all  Food Insecurity: No Food Insecurity (02/26/2023)   Hunger Vital Sign    Worried About Running Out of Food in the Last Year: Never true    Ran Out of Food in the Last Year: Never true  Transportation Needs: No Transportation Needs (02/26/2023)   PRAPARE - Therapist, art (Medical): No    Lack of Transportation (Non-Medical): No  Physical Activity: Insufficiently Active (02/26/2023)   Exercise Vital Sign    Days of Exercise per Week: 3 days    Minutes of Exercise per Session: 40 min  Stress: Stress Concern Present (02/26/2023)   [employer] of Occupational Health - Occupational Stress Questionnaire    Feeling of Stress : To some extent  Social Connections: Moderately Integrated (02/26/2023)   Social Connection and Isolation Panel [NHANES]    Frequency of Communication with Friends and Family: More than three times a week    Frequency of Social Gatherings with Friends and Family: More than three times a week    Attends Religious Services: Never    Database administrator or Organizations: Yes    Attends Engineer, structural: More than 4 times per year    Marital Status: Married  Catering manager Violence: Not At Risk (02/26/2023)   Humiliation, Afraid, Rape, and Kick questionnaire    Fear of Current or Ex-Partner: No    Emotionally Abused: No    Physically Abused: No    Sexually Abused: No    Review of Systems  PHYSICAL EXAMINATION:   There were no vitals taken for this visit.    General appearance: alert, cooperative and appears stated age Head: Normocephalic, without  obvious abnormality, atraumatic Neck: no adenopathy, supple, symmetrical, trachea midline and thyroid normal to inspection and palpation Lungs: clear to auscultation bilaterally Breasts: normal appearance, no masses or tenderness, No nipple retraction or dimpling, No nipple discharge or bleeding, No axillary or supraclavicular adenopathy Heart: regular rate and rhythm Abdomen: soft, non-tender, no masses,  no organomegaly Extremities: extremities normal, atraumatic, no cyanosis or edema Skin: Skin color, texture, turgor normal. No rashes or lesions Lymph nodes: Cervical, supraclavicular, and axillary nodes normal. No abnormal inguinal nodes  palpated Neurologic: Grossly normal  Pelvic: External genitalia:  no lesions              Urethra:  normal appearing urethra with no masses, tenderness or lesions              Bartholins and Skenes: normal                 Vagina: normal appearing vagina with normal color and discharge, no lesions              Cervix: no lesions                Bimanual Exam:  Uterus:  normal size, contour, position, consistency, mobility, non-tender              Adnexa: no mass, fullness, tenderness              Rectal exam: {yes no:314532}.  Confirms.              Anus:  normal sphincter tone, no lesions  Chaperone was present for exam:  {BSCHAPERONE:31226::"Emily F, CMA"}  ASSESSMENT:    PLAN:    {LABS (Optional):23779}  ***  total time was spent for this patient encounter, including preparation, face-to-face counseling with the patient, coordination of care, and documentation of the encounter.

## 2023-10-09 NOTE — Telephone Encounter (Signed)
Patient notified. Message sent to scheduling department to call her to schedule

## 2023-10-09 NOTE — Telephone Encounter (Signed)
Patient needs office visit and retesting of her urine here or with her PCP.  She had a UTI and was treated with Augmentin on the day of her visit.  Final UC showed E Coli resistant to Augmentin.  I recommend switching her abx to Ciprofloxacin or Fosfomycin, and she declined as she was feeling better.   If she is having symptoms, I would recommend re-evaluation.  She may still have infection present.

## 2023-10-09 NOTE — Telephone Encounter (Signed)
Pt LVM in triage line stating that been treated recently for UTI w/ abx and throughout taking the abx, she experienced stomach cramps and nausea. However, calling today since finished abx on sat and still having these sxs. Pt inquiring if this is something that needs to be investigated further?  Please advise.

## 2023-10-09 NOTE — Telephone Encounter (Signed)
Patient notified. Appt is 10-10-23 with Dr Edward Jolly

## 2023-10-10 ENCOUNTER — Ambulatory Visit: Payer: Self-pay | Admitting: Internal Medicine

## 2023-10-10 ENCOUNTER — Ambulatory Visit (INDEPENDENT_AMBULATORY_CARE_PROVIDER_SITE_OTHER): Payer: Medicare Other | Admitting: Obstetrics and Gynecology

## 2023-10-10 ENCOUNTER — Encounter: Payer: Self-pay | Admitting: Obstetrics and Gynecology

## 2023-10-10 VITALS — BP 124/84 | HR 85 | Ht 64.0 in | Wt 143.0 lb

## 2023-10-10 DIAGNOSIS — R109 Unspecified abdominal pain: Secondary | ICD-10-CM

## 2023-10-10 DIAGNOSIS — N390 Urinary tract infection, site not specified: Secondary | ICD-10-CM | POA: Diagnosis not present

## 2023-10-10 NOTE — Telephone Encounter (Signed)
Chief Complaint: Abdominal cramping Symptoms: Abdominal cramping, nausea, loose stools Frequency: Ongoing since day 3 of atb treatment Pertinent Negatives: Patient denies fever, dysuria, hematuria Disposition: [] ED /[] Urgent Care (no appt availability in office) / [] Appointment(In office/virtual)/ []  [place] [facility]/ [x] Home Care/ [] Refused Recommended Disposition /[] [place] [facility]/ []  Follow-up with PCP Additional Notes: Pt reports she completed 5 day antibiotic course that she began on Wednesday of last week. Pt notes on day 3 she began experiencing abdominal cramping, loose stools and nausea. Pt did complete atb course and notes UTI symptoms have resolved. This RN advised her symptoms may be due to atb SE however a message will be forwarded to the provider for review. This RN educated pt on home care, new-worsening symptoms, when to call back/seek emergent care. Pt verbalized understanding and agrees to plan.    Copied from CRM [id number]. Topic: Clinical - Medication Question >> Oct 10, 2023 12:21 PM Alvino Blood C wrote: Reason for CRM: Patient is still having some of the symptoms due to her UTI and would like for a nurse to give her a call back at [phone] Reason for Disposition  [1] Reasonable improvement on antibiotics AND [2] no fever  Answer Assessment - Initial Assessment Questions 1. MAIN SYMPTOM: "What is the main symptom you are concerned about?" (e.g., painful urination, urine frequency)     Abdominal cramping, nausea 2. BETTER-SAME-WORSE: "Are you getting better, staying the same, or getting worse compared to how you felt at your last visit to the doctor (most recent medical visit)?"     Day 3 pt developed nausea, abd cramping, loose stools 3. PAIN: "How bad is the pain?"  (e.g., Scale 1-10; mild, moderate, or severe)   - MILD (1-3): complains slightly about urination hurting   - MODERATE (4-7): interferes with normal activities     - SEVERE (8-10): excruciating,  unwilling or unable to urinate because of the pain      Cramping 4. FEVER: "Do you have a fever?" If Yes, ask: "What is it, how was it measured, and when did it start?"     None 5. OTHER SYMPTOMS: "Do you have any other symptoms?" (e.g., blood in the urine, flank pain, vaginal discharge)     None 6. DIAGNOSIS: "When was the UTI diagnosed?" "By whom?" "Was it a kidney infection, bladder infection or both?"     OBGYN 7. ANTIBIOTIC: "What antibiotic(s) are you taking?" "How many times per day?"     Augmentin 8. ANTIBIOTIC - START DATE: "When did you start taking the antibiotic?"     Last Wednesday  Protocols used: Urinary Tract Infection on Antibiotic Follow-up Call - [facility]

## 2023-10-11 ENCOUNTER — Ambulatory Visit (INDEPENDENT_AMBULATORY_CARE_PROVIDER_SITE_OTHER): Payer: Medicare Other | Admitting: Physician Assistant

## 2023-10-11 ENCOUNTER — Ambulatory Visit (HOSPITAL_COMMUNITY)
Admission: RE | Admit: 2023-10-11 | Discharge: 2023-10-11 | Disposition: A | Discharge status: Home / Self Care | Payer: Medicare Other | Source: Ambulatory Visit | Attending: Physician Assistant

## 2023-10-11 ENCOUNTER — Encounter: Payer: Self-pay | Admitting: Physician Assistant

## 2023-10-11 ENCOUNTER — Ambulatory Visit: Payer: Self-pay | Admitting: Internal Medicine

## 2023-10-11 VITALS — BP 110/80 | HR 78 | Temp 97.4°F | Ht 64.0 in | Wt 141.0 lb

## 2023-10-11 DIAGNOSIS — R1084 Generalized abdominal pain: Secondary | ICD-10-CM

## 2023-10-11 DIAGNOSIS — R197 Diarrhea, unspecified: Secondary | ICD-10-CM | POA: Diagnosis not present

## 2023-10-11 DIAGNOSIS — K769 Liver disease, unspecified: Secondary | ICD-10-CM | POA: Diagnosis not present

## 2023-10-11 DIAGNOSIS — K8689 Other specified diseases of pancreas: Secondary | ICD-10-CM | POA: Diagnosis not present

## 2023-10-11 DIAGNOSIS — K5730 Diverticulosis of large intestine without perforation or abscess without bleeding: Secondary | ICD-10-CM | POA: Diagnosis not present

## 2023-10-11 DIAGNOSIS — K7689 Other specified diseases of liver: Secondary | ICD-10-CM | POA: Diagnosis not present

## 2023-10-11 LAB — LIPASE: Lipase: 24 U/L (ref 11.0–59.0)

## 2023-10-11 LAB — CBC WITH DIFFERENTIAL/PLATELET
Basophils Absolute: 0 10*3/uL (ref 0.0–0.1)
Basophils Relative: 0.5 % (ref 0.0–3.0)
Eosinophils Absolute: 0.2 10*3/uL (ref 0.0–0.7)
Eosinophils Relative: 4.1 % (ref 0.0–5.0)
HCT: 41.5 % (ref 36.0–46.0)
Hemoglobin: 13.8 g/dL (ref 12.0–15.0)
Lymphocytes Relative: 16.4 % (ref 12.0–46.0)
Lymphs Abs: 0.9 10*3/uL (ref 0.7–4.0)
MCHC: 33.1 g/dL (ref 30.0–36.0)
MCV: 100.5 fL — ABNORMAL HIGH (ref 78.0–100.0)
Monocytes Absolute: 0.6 10*3/uL (ref 0.1–1.0)
Monocytes Relative: 10.2 % (ref 3.0–12.0)
Neutro Abs: 3.8 10*3/uL (ref 1.4–7.7)
Neutrophils Relative %: 68.8 % (ref 43.0–77.0)
Platelets: 219 10*3/uL (ref 150.0–400.0)
RBC: 4.13 Mil/uL (ref 3.87–5.11)
RDW: 14.4 % (ref 11.5–15.5)
WBC: 5.5 10*3/uL (ref 4.0–10.5)

## 2023-10-11 LAB — COMPREHENSIVE METABOLIC PANEL
ALT: 9 U/L (ref 0–35)
AST: 19 U/L (ref 0–37)
Albumin: 4.1 g/dL (ref 3.5–5.2)
Alkaline Phosphatase: 53 U/L (ref 39–117)
BUN: 13 mg/dL (ref 6–23)
CO2: 27 meq/L (ref 19–32)
Calcium: 9.2 mg/dL (ref 8.4–10.5)
Chloride: 98 meq/L (ref 96–112)
Creatinine, Ser: 1.09 mg/dL (ref 0.40–1.20)
GFR: 45.88 mL/min — ABNORMAL LOW (ref >60.00)
Glucose, Bld: 79 mg/dL (ref 70–99)
Potassium: 4.5 meq/L (ref 3.5–5.1)
Sodium: 134 meq/L — ABNORMAL LOW (ref 135–145)
Total Bilirubin: 0.9 mg/dL (ref 0.2–1.2)
Total Protein: 6.7 g/dL (ref 6.0–8.3)

## 2023-10-11 MED ORDER — IOHEXOL 300 MG/ML  SOLN
100.0000 mL | Freq: Once | INTRAMUSCULAR | Status: AC | PRN
  Administered 2023-10-11: 100 mL via INTRAVENOUS

## 2023-10-11 NOTE — Patient Instructions (Signed)
It was great to see you!  Stop Jardiance until diarrhea resolves and please discuss with gynecology if this can be contributing to recurrent urinary tract infection (UTI) symptom(s)  Stat CT abdomen/pelvis  If concerns for significant dehydration with dizziness, lightheadedness, unable to urinate x 24 hours, weakness, fatigue -- GO TO THE ER  See attached info about additional food choices  Take care,  Jarold Motto PA-C

## 2023-10-11 NOTE — Telephone Encounter (Signed)
Copied from CRM [id number]. Topic: Clinical - Red Word Triage >> Oct 11, 2023  9:20 AM Florestine Avers wrote: Red Word that prompted transfer to Nurse Triage: Patient states that she is still having symptoms of stomach cramps, loose stools, and nausea. Patient said she has tried to eat broth and apple sauce and it still runs through her. Patient is requesting to speak to a nurse, she is concerned because she is not able to eat.   Chief Complaint: GI symptoms Symptoms: diarrhea/loose stool, abd cramping, can't eat Pertinent Negatives: Patient denies vomiting Disposition: [] ED /[] Urgent Care (no appt availability in office) / [x] Appointment(In office/virtual)/ []  [place] [facility]/ [] Home Care/ [] Refused Recommended Disposition /[] [place] [facility]/ []  Follow-up with PCP Additional Notes: Patient stated she was recently on antibiotics for a UTI. She completed the treatment on Saturday night and UTI has cleared. Patient stated she started having abd cramping, loose stools, and difficulty eating while she was on the antibiotics, but her GI symptoms have not resolved yet. Appointment scheduled for today.    Reason for Disposition  Diarrhea continues for > 3 days after stopping the antibiotic  Answer Assessment - Initial Assessment Questions 1. DIARRHEA SEVERITY: "How bad is the diarrhea?" "How many more stools have you had in the past 24 hours than normal?"    - NO DIARRHEA (SCALE 0)   - MILD (SCALE 1-3): Few loose or mushy BMs; increase of 1-3 stools over normal daily number of stools; mild increase in ostomy output.   -  MODERATE (SCALE 4-7): Increase of 4-6 stools daily over normal; moderate increase in ostomy output. * SEVERE (SCALE 8-10; OR 'WORST POSSIBLE'): Increase of 7 or more stools daily over normal; moderate increase in ostomy output; incontinence.     Mild  2. ONSET: "When did the diarrhea begin?"      Tuesday  3. BM CONSISTENCY: "How loose or watery is the diarrhea?"      Very  loose stool, 1 watery yesterday  4. VOMITING: "Are you also vomiting?" If Yes, ask: "How many times in the past 24 hours?"      No  5. ABDOMEN PAIN: "Are you having any abdomen pain?" If Yes, ask: "What does it feel like?" (e.g., crampy, dull, intermittent, constant)      Cramping  6. ABDOMEN PAIN SEVERITY: If present, ask: "How bad is the pain?"  (e.g., Scale 1-10; mild, moderate, or severe)   - MILD (1-3): doesn't interfere with normal activities, abdomen soft and not tender to touch    - MODERATE (4-7): interferes with normal activities or awakens from sleep, abdomen tender to touch    - SEVERE (8-10): excruciating pain, doubled over, unable to do any normal activities       4/10  7. OTHER SYMPTOMS: "Do you have any other symptoms?" (e.g., fever, blood in stool)       Very tired, low energy  Protocols used: Diarrhea on Antibiotics-A-AH

## 2023-10-11 NOTE — Progress Notes (Signed)
Hailey [name] is a 89 y.o. female here for a new problem.  History of Present Illness:   Chief Complaint  Patient presents with   GI Problem    Pt c/o loose stools every time after she eats anything and abdominal cramps, x 4 days.    HPI  GI distress: Pt complains of loose stools, nausea, vomiting, lack of appetite, abdominal cramps. Was recently on a 5 day round of antibiotics to treat an UTI, last dose taken on Saturday. Her GI symptoms started on the 3rd/4th day of antibiotics.  After since finishing her antibiotics her loose stools, nausea, and abdominal cramps did not improve. States she is unable to eat due to diarrhea and vomiting soon after eating.  OB/GYN also advised pt follow a BRAT diet.  She has been eating bland foods such as applesauce, broth, and saltine crackers and reports diarrhea quickly after eating.  Overall her symptoms are the same and have not improved.  Of note, she was dx with diverticulitis while on a cruise in [place] for which she was hospitalized for 3 days.  Her last meal was yesterday around 1 pm.   Denies any typical diverticulosis pain.  Has been drinking plenty fluids and urinating at least 3 times a day.  She is taking a Pepcid Complete chewable tabs at nighttime.  Is also on Zofran to manage her nausea, last taken this morning.  Denies any lightheadedness or dizziness.  Not taking any magnesium supplements.   Past Medical History:  Diagnosis Date   Acute blood loss anemia 12/25/2017   Allergic rhinitis    Anxiety    Arthritis    "my whole spine" (07/01/2017)   Arthritis    Atrial fibrillation (HCC)    Bowel obstruction (HCC)    in [place]   Bradycardia with 41-50 beats per minute 12/27/2017   Cancer (HCC)    Cellulitis of leg, right with large prepatella hematoma and open wounds 12/26/2017   CKD (chronic kidney disease) stage 2, GFR 60-89 ml/min    Colon polyps    Coronary artery disease    10/18 PCI/DES to p/m LCx with cutting  balloon to mLcx   Diverticulosis of colon    GERD (gastroesophageal reflux disease)    Hip bursitis 2010   Dr Wyline Mood, Post op seroma   History of colon polyps    HSV (herpes simplex virus) anogenital infection 07/2019   HTN (hypertension)    IBS (irritable bowel syndrome)    constipation predominant - Dr Kinnie Scales   Lichen sclerosus    Osteopenia 11/2016   T score -2.0 FRAX 15%/4.3%   PAC (premature atrial contraction)    Symptomatiic   Renal insufficiency    Right bundle branch block (RBBB) on electrocardiogram (ECG) 12/27/2017   Scoliosis    SVT (supraventricular tachycardia) (HCC)    brief history   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   biopsy showing vulvar atypia, possible VIN I     Social History   Tobacco Use   Smoking status: Never   Smokeless tobacco: Never  Vaping Use   Vaping status: Never Used  Substance Use Topics   Alcohol use: Never    Comment: 7 vodka drinks a week   Drug use: Never    Past Surgical History:  Procedure Laterality Date   ANTERIOR AND POSTERIOR VAGINAL REPAIR  01/2002   Hattie Perch 01/23/2011   APPENDECTOMY  1946   CARDIAC CATHETERIZATION  06/26/2017   CORONARY ANGIOPLASTY WITH STENT PLACEMENT  07/01/2017   CORONARY ATHERECTOMY N/A 07/01/2017   Procedure: CORONARY ATHERECTOMY;  Surgeon: Lyn Records, MD;  Location[facility] INVASIVE CV LAB;  Service: Cardiovascular;  Laterality: N/A;   CORONARY STENT INTERVENTION N/A 07/01/2017   Procedure: CORONARY STENT INTERVENTION;  Surgeon: Lyn Records, MD;  Location: [facility] INVASIVE CV LAB;  Service: Cardiovascular;  Laterality: N/A;   HAMMER TOE SURGERY     HEMORRHOID BANDING     HIP SURGERY Left 04/2009   hip examination under anesthesia followed by greater trochanteric bursectomy; iliotibial band tenotomy/notes 01/20/2011   I & D EXTREMITY Right 01/10/2018   Procedure: IRRIGATION AND DEBRIDEMENT RIGHT KNEE, APPLY WOUND VAC;  Surgeon: Nadara Mustard, MD;  Location: [facility] OR;  Service: Orthopedics;   Laterality: Right;   KNEE BURSECTOMY Right 04/2009   Hattie Perch 01/09/2011   LEFT ATRIAL APPENDAGE OCCLUSION N/A 08/24/2021   Procedure: LEFT ATRIAL APPENDAGE OCCLUSION;  Surgeon: Tonny Bollman, MD;  Location: [facility] INVASIVE CV LAB;  Service: Cardiovascular;  Laterality: N/A;   LEFT HEART CATH AND CORONARY ANGIOGRAPHY N/A 06/26/2017   Procedure: LEFT HEART CATH AND CORONARY ANGIOGRAPHY;  Surgeon: Lyn Records, MD;  Location: [facility] INVASIVE CV LAB;  Service: Cardiovascular;  Laterality: N/A;   PUBOVAGINAL SLING  01/2002   Hattie Perch 01/23/2011   REDUCTION MAMMAPLASTY     TEE WITHOUT CARDIOVERSION N/A 08/24/2021   Procedure: TRANSESOPHAGEAL ECHOCARDIOGRAM (TEE);  Surgeon: Tonny Bollman, MD;  Location: [facility] INVASIVE CV LAB;  Service: Cardiovascular;  Laterality: N/A;   TEMPORARY PACEMAKER N/A 07/01/2017   Procedure: TEMPORARY PACEMAKER;  Surgeon: Lyn Records, MD;  Location: [facility] INVASIVE CV LAB;  Service: Cardiovascular;  Laterality: N/A;   VAGINAL HYSTERECTOMY  01/2002   Vaginal hysterectomy, bilateral salpingo-oophorectomy/notes 01/23/2011    Family History  Problem Relation Age of Onset   Colon cancer Mother        Dx age 45, died at age 25   Diabetes Father    Prostate cancer Father    Prostate cancer Brother    Pancreatic cancer Brother    Stomach cancer Son     Allergies  Allergen Reactions   Macrobid [Nitrofurantoin Monohyd Macro] Other (See Comments)    Nausea, stomach cramps, fatigue , headache.   Meloxicam Other (See Comments)    Jittery and headache   Digoxin And Related Other (See Comments)    headaches   Diprolene [Betamethasone Dipropionate Aug] Other (See Comments)    Patient states that the bruising was made worse when using this   Ferrous Sulfate Other (See Comments)    Bad constipation    Keflex [Cephalexin] Nausea And Vomiting    Nausea, fatigue, and headache.   Lipitor [Atorvastatin]     Abd pain   Mobic [Meloxicam] Other (See Comments)    Unsure of reaction type    Sulfamethoxazole-Trimethoprim Nausea Only    Current Medications:   Current Outpatient Medications:    acetaminophen (TYLENOL) 500 MG tablet, Take 1,000 mg by mouth every 6 (six) hours as needed (body aches / sore hip). Maximum of 6 tablets daily (3000mg ), Disp: , Rfl:    albuterol (VENTOLIN HFA) 108 (90 Base) MCG/ACT inhaler, Inhale 2 puffs into the lungs every 6 (six) hours as needed for wheezing or shortness of breath., Disp: 8 g, Rfl: 1   BIOTIN PO, Take 5,000 mcg by mouth in the morning., Disp: , Rfl:    clobetasol ointment (TEMOVATE) 0.05 %, Apply to the affected area in a thin layer twice a day for 2  weeks as needed for a flare.  Then apply to the area twice a week at bedtime as maintenance dosing., Disp: 60 g, Rfl: 1   cyclobenzaprine (FLEXERIL) 10 MG tablet, Take 1 tablet (10 mg total) by mouth 3 (three) times daily as needed for muscle spasms., Disp: 30 tablet, Rfl: 0   diclofenac Sodium (VOLTAREN) 1 % GEL, Apply 2 g topically 4 (four) times daily as needed (back pain.)., Disp: , Rfl:    dicyclomine (BENTYL) 20 MG tablet, Take 1 by mouth every 4-6 hours as needed for cramping, Disp: 30 tablet, Rfl: 6   ELIQUIS 2.5 MG TABS tablet, TAKE 1 TABLET BY MOUTH TWICE  DAILY, Disp: 200 tablet, Rfl: 2   empagliflozin (JARDIANCE) 10 MG TABS tablet, Take 1 tablet (10 mg total) by mouth daily before breakfast., Disp: 90 tablet, Rfl: 2   escitalopram (LEXAPRO) 5 MG tablet, Take 1 tablet (5 mg total) by mouth every evening., Disp: 30 tablet, Rfl: 5   esomeprazole (NEXIUM) 40 MG capsule, TAKE ONE CAPSULE ONCE DAILY, Disp: 90 capsule, Rfl: 1   Homeopathic Products (ARNICARE) GEL, Apply 1 application  topically daily as needed (skin bruising)., Disp: , Rfl:    hyoscyamine (LEVSIN) 0.125 MG tablet, TAKE 1-2 TABLETS EVERY 4 HOURS AS NEEDED FOR UP TO 10 DAYS FOR CRAMPING., Disp: 100 tablet, Rfl: 3   ipratropium (ATROVENT) 0.06 % nasal spray, Place 2 sprays into the nose 4 (four) times daily., Disp: 15 mL,  Rfl: 5   lidocaine (XYLOCAINE) 5 % ointment, Apply 1 Application topically 3 (three) times daily. Uses as needed three times a day for pain of the vulva., Disp: 1.25 g, Rfl: 1   LORazepam (ATIVAN) 1 MG tablet, TAKE ONE TABLET BY MOUTH TWICE DAILY AS NEEDED FOR ANXIETY / SLEEP., Disp: 180 tablet, Rfl: 1   losartan (COZAAR) 25 MG tablet, Take 1 tablet (25 mg total) by mouth daily., Disp: 90 tablet, Rfl: 3   nitroGLYCERIN (NITROSTAT) 0.4 MG SL tablet, Place 1 tablet (0.4 mg total) under the tongue every 5 (five) minutes as needed for chest pain (Call 911 at 3rd dose within 15 minutes.)., Disp: 20 tablet, Rfl: 3   ondansetron (ZOFRAN-ODT) 8 MG disintegrating tablet, dissolve 1 tablet (8 mg total) in mouth every 8 (eight) hours as needed for nausea or vomiting., Disp: 12 tablet, Rfl: 3   Polyethyl Glycol-Propyl Glycol (LUBRICANT EYE DROPS) 0.4-0.3 % SOLN, Place 1-2 drops into both eyes at bedtime., Disp: , Rfl:    polyethylene glycol (MIRALAX / GLYCOLAX) 17 g packet, Take 17 g by mouth as needed for mild constipation or moderate constipation., Disp: , Rfl:    sodium chloride (OCEAN) 0.65 % SOLN nasal spray, Place 1 spray into both nostrils at bedtime as needed for congestion., Disp: , Rfl:    Review of Systems:   Negative unless otherwise specified per HPI.  Vitals:   Vitals:   10/11/23 1039  BP: 110/80  Pulse: 78  Temp: (!) 97.4 F (36.3 C)  TempSrc: Temporal  SpO2: 98%  Weight: 141 lb (64 kg)  Height: 5\' 4"  (1.626 m)     Body mass index is 24.2 kg/m.  Physical Exam:   Physical Exam Vitals and nursing note reviewed.  Constitutional:      General: She is not in acute distress.    Appearance: She is well-developed. She is not ill-appearing or toxic-appearing.  Cardiovascular:     Rate and Rhythm: Normal rate and regular rhythm.     Pulses:  Normal pulses.     Heart sounds: Normal heart sounds, S1 normal and S2 normal.  Pulmonary:     Effort: Pulmonary effort is normal.     Breath  sounds: Normal breath sounds.  Abdominal:     General: Abdomen is flat. Bowel sounds are normal.     Palpations: Abdomen is soft.     Tenderness: There is no abdominal tenderness.  Skin:    General: Skin is warm and dry.  Neurological:     Mental Status: She is alert.     GCS: GCS eye subscore is 4. GCS verbal subscore is 5. GCS motor subscore is 6.  Psychiatric:        Speech: Speech normal.        Behavior: Behavior normal. Behavior is cooperative.     Assessment and Plan:   Generalized abdominal pain (Primary);Diarrhea, unspecified type Ongoing No acute abdomen or signs of significant dehydration on my exam Discussed differential diagnosis includes: antibiotic-associated diarrhea, C diff, viral gastroenteritis, diverticulitis Stop Jardiance until diarrhea resolves and please discuss with gynecology if this can be contributing to recurrent urinary tract infection (UTI) symptom(s)  Stat CT abdomen/pelvis Update blood work today Handout for diarrhea-friendly foods If concerns for significant dehydration with dizziness, lightheadedness, unable to urinate x 24 hours, weakness, fatigue -- GO TO THE ER  - CT ABDOMEN PELVIS W CONTRAST; Future - CBC with Differential/Platelet - Comprehensive metabolic panel - Lipase - GI Profile, Stool, PCR  I, Isabelle Course, acting as a Neurosurgeon for [employer], [place]., have documented all relevant documentation on the behalf of Jarold Motto, [place], as directed by  Jarold Motto, PA while in the presence of Jarold Motto, [place].  I, Jarold Motto, [place], have reviewed all documentation for this visit. The documentation on 10/11/23 for the exam, diagnosis, procedures, and orders are all accurate and complete.  I spent a total of 30 minutes on this visit, today 10/11/23, which included reviewing previous notes from gynecology, ordering tests, discussing plan of care with patient and using shared-decision making on next steps, and documenting the  findings in the note.   Jarold Motto, PA-C

## 2023-10-11 NOTE — Telephone Encounter (Signed)
 Noted! Thank you

## 2023-10-12 ENCOUNTER — Encounter: Payer: Self-pay | Admitting: Obstetrics and Gynecology

## 2023-10-12 LAB — URINALYSIS, COMPLETE W/RFL CULTURE
Bacteria, UA: NONE SEEN /[HPF]
Casts: NONE SEEN /[LPF]
Crystals: NONE SEEN /[HPF]
Ketones, ur: NEGATIVE
Leukocyte Esterase: NEGATIVE
Nitrites, Initial: NEGATIVE
Specific Gravity, Urine: 1.01 (ref 1.001–1.035)
Squamous Epithelial / HPF: NONE SEEN /[HPF] (ref <=5)
WBC, UA: NONE SEEN /[HPF] (ref 0–5)
Yeast: NONE SEEN /[HPF]
pH: 6 (ref 5.0–8.0)

## 2023-10-12 LAB — URINE CULTURE
MICRO NUMBER:: 16019690
Result:: NO GROWTH
SPECIMEN QUALITY:: ADEQUATE

## 2023-10-12 LAB — CULTURE INDICATED

## 2023-10-13 ENCOUNTER — Encounter: Payer: Self-pay | Admitting: Physician Assistant

## 2023-10-14 ENCOUNTER — Other Ambulatory Visit: Payer: Medicare Other

## 2023-10-14 DIAGNOSIS — R197 Diarrhea, unspecified: Secondary | ICD-10-CM | POA: Diagnosis not present

## 2023-10-14 DIAGNOSIS — R1084 Generalized abdominal pain: Secondary | ICD-10-CM | POA: Diagnosis not present

## 2023-10-15 LAB — GI PROFILE, STOOL, PCR
Adenovirus F 40/41: NOT DETECTED
Astrovirus: NOT DETECTED
C difficile toxin A/B: DETECTED — AB
Campylobacter: NOT DETECTED
Cryptosporidium: NOT DETECTED
Cyclospora cayetanensis: NOT DETECTED
Entamoeba histolytica: NOT DETECTED
Enteroaggregative E coli: NOT DETECTED
Enteropathogenic E coli: NOT DETECTED
Enterotoxigenic E coli: NOT DETECTED
Giardia lamblia: NOT DETECTED
Norovirus GI/GII: NOT DETECTED
Plesiomonas shigelloides: NOT DETECTED
Rotavirus A: NOT DETECTED
Salmonella: DETECTED — AB
Sapovirus: NOT DETECTED
Shiga-toxin-producing E coli: NOT DETECTED
Shigella/Enteroinvasive E coli: NOT DETECTED
Vibrio cholerae: NOT DETECTED
Vibrio: NOT DETECTED
Yersinia enterocolitica: NOT DETECTED

## 2023-10-16 ENCOUNTER — Telehealth: Payer: Self-pay | Admitting: *Deleted

## 2023-10-16 ENCOUNTER — Other Ambulatory Visit: Payer: Self-pay | Admitting: Physician Assistant

## 2023-10-16 ENCOUNTER — Other Ambulatory Visit (HOSPITAL_COMMUNITY): Payer: Self-pay

## 2023-10-16 MED ORDER — FIDAXOMICIN 200 MG PO TABS: 200.0000 mg | ORAL_TABLET | Freq: Two times a day (BID) | ORAL | 0 refills | Status: DC

## 2023-10-16 MED ORDER — FIDAXOMICIN 200 MG PO TABS
200.0000 mg | ORAL_TABLET | Freq: Two times a day (BID) | ORAL | 0 refills | Status: AC
  Filled 2023-10-16: qty 28, 14d supply, fill #0

## 2023-10-16 NOTE — Telephone Encounter (Signed)
 Left message on voicemail to call office.

## 2023-10-16 NOTE — Telephone Encounter (Signed)
 Spoke to pt I saw you read your results and the medication that was sent to [facility] was too expensive there. [name] wants to know if okay to send to [facility]? Pt said yes that is fine. Pt said she is concerned about going on another antibiotic she is very sensitive to medications and she has not had diarrhea since Sunday, has not moved her bowels since then. Told pt need to start medication for the infection. If you have any problems please let us  know. Pt verbalized understanding.  Asked pt if she is taking imodium? Pt said no, told her she can try Miralax  that she has. Pt verbalized understanding. Rx sent to pharmacy [facility].

## 2023-10-16 NOTE — Telephone Encounter (Signed)
 Copied from CRM [id number]. Topic: Clinical - Prescription Issue >> Oct 16, 2023  9:59 AM Leila BROCKS wrote: Reason for CRM: Luke from [facility] pharmacy [phone] fidaxomicin  (DIFICID ) 200 MG TABS tablet the medication bottle comes in bottle of 20 pills, they cannot break the bottle it's like $5000, maybe another chain like [facility] or [facility] pharmacy can break the bottle. Or changed the dispense of medication. Please advise.

## 2023-10-16 NOTE — Telephone Encounter (Signed)
Please see message from pharmacy and advise

## 2023-10-16 NOTE — Addendum Note (Signed)
 Addended by: Winona Haw on: 10/16/2023 01:08 PM   Modules accepted: Orders

## 2023-10-16 NOTE — Telephone Encounter (Signed)
 Copied from CRM [id number]. Topic: General - Other >> Oct 16, 2023 12:08 PM Trula Gable C wrote: Reason for CRM: Patient called in regarding missed call, she is requesting a callback as she stated she has some questions

## 2023-10-16 NOTE — Telephone Encounter (Signed)
 Copied from CRM [id number]. Topic: Clinical - Lab/Test Results >> Oct 16, 2023  9:18 AM Freya Jesus wrote: Reason for CRM: Patient called requesting to speak with Alexander Iba regarding her lab test results.

## 2023-10-16 NOTE — Telephone Encounter (Signed)
 See result notes.

## 2023-10-17 ENCOUNTER — Telehealth: Payer: Self-pay | Admitting: Internal Medicine

## 2023-10-17 NOTE — Telephone Encounter (Signed)
 Pt has been diagnosed with salmonella and cdiff. She was placed on dificid  bid for 14 days. She is taking a probiotic also. Pt wanted to let Dr. Elvin Hammer know as he has been treating her for years. Dr. Elvin Hammer notified.

## 2023-10-17 NOTE — Telephone Encounter (Signed)
Pt aware.

## 2023-10-17 NOTE — Telephone Encounter (Signed)
 Patient called and stated that she was diagnosed by her primary care with C Diff and salmonella. Patient stated you can look at her mychart. Patient is wanting to know that the right protocol is taken. Patient also stated that her taking Antibiotics for her C Diff. Patient was presenting symptoms of diarrhea which she longer has but is still having abdominal cramps. Patient is requesting a call back from Dr. Abran. Please advise.

## 2023-10-17 NOTE — Telephone Encounter (Signed)
 Noted. These are the correct measures. Thanks

## 2023-10-22 ENCOUNTER — Ambulatory Visit: Payer: Self-pay | Admitting: Internal Medicine

## 2023-10-22 ENCOUNTER — Ambulatory Visit: Payer: Medicare Other | Admitting: Emergency Medicine

## 2023-10-22 ENCOUNTER — Encounter: Payer: Self-pay | Admitting: Emergency Medicine

## 2023-10-22 VITALS — BP 130/88 | HR 59 | Temp 98.5°F | Ht 64.0 in | Wt 143.0 lb

## 2023-10-22 DIAGNOSIS — R109 Unspecified abdominal pain: Secondary | ICD-10-CM

## 2023-10-22 DIAGNOSIS — A498 Other bacterial infections of unspecified site: Secondary | ICD-10-CM | POA: Diagnosis not present

## 2023-10-22 NOTE — Patient Instructions (Signed)
Abdominal Pain, Adult    Many things can cause belly (abdominal) pain. In most cases, belly pain is not a serious problem and can be watched and treated at home. But in some cases, it can be serious.  Your doctor will try to find the cause of your belly pain.  Follow these instructions at home:  Medicines  Take over-the-counter and prescription medicines only as told by your doctor.  Do not take medicines that help you poop (laxatives) unless told by your doctor.  General instructions  Watch your belly pain for any changes. Tell your doctor if the pain gets worse.  Drink enough fluid to keep your pee (urine) pale yellow.  Contact a doctor if:  Your belly pain changes or gets worse.  You have very bad cramping or bloating in your belly.  You vomit.  Your pain gets worse with meals, after eating, or with certain foods.  You have trouble pooping or have watery poop for more than 2-3 days.  You are not hungry, or you lose weight without trying.  You have signs of not getting enough fluid or water (dehydration). These may include:  Dark pee, very Chastain pee, or no pee.  Cracked lips or dry mouth.  Feeling sleepy or weak.  You have pain when you pee or poop.  Your belly pain wakes you up at night.  You have blood in your pee.  You have a fever.  Get help right away if:  You cannot stop vomiting.  Your pain is only in one part of your belly, like on the right side.  You have bloody or black poop, or poop that looks like tar.  You have trouble breathing.  You have chest pain.  These symptoms may be an emergency. Get help right away. Call 911.  Do not wait to see if the symptoms will go away.  Do not drive yourself to the hospital.  This information is not intended to replace advice given to you by your health care provider. Make sure you discuss any questions you have with your health care provider.  Document Revised: 06/13/2022 Document Reviewed: 06/13/2022  Elsevier Patient Education  2024 [employer].

## 2023-10-22 NOTE — Telephone Encounter (Signed)
Chief Complaint: Abdominal pain  Symptoms: loss of appetite, fatigue, nausea, abdominal pain  Frequency: comes and goes  Pertinent Negatives: Patient denies fever, chest pain,diarrhea Disposition: [] ED /[] Urgent Care (no appt availability in office) / [x] Appointment(In office/virtual)/ []  [facility]/ [] Home Care/ [] Refused Recommended Disposition /[] [place] [facility]/ []  Follow-up with PCP Additional Notes: Patient states she was dx with C-diff on 10/10/23 and symptoms got better with treatment. Patient states she has new symptoms with abdominal cramps, loss of appetite and fatigue. Patient was given care advice and scheduled for an appointment today at 1400. Patient states she is 10 minutes away and can get there on time. Copied from CRM [id number]. Topic: Clinical - Red Word Triage >> Oct 22, 2023  1:26 PM Gurney Maxin H wrote: [facility] that prompted transfer to Nurse Triage: Patient states she's having some stomach pain, nausea and fatigue. No appetite and soft stool, going to the bathroom a lot. Reason for Disposition  [1] MODERATE pain (e.g., interferes with normal activities) AND [2] pain comes and goes (cramps) AND [3] present > 24 hours  (Exception: Pain with Vomiting or Diarrhea - see that Guideline.)  Answer Assessment - Initial Assessment Questions 1. LOCATION: "Where does it hurt?"      Lower abdominal pain  2. RADIATION: "Does the pain shoot anywhere else?" (e.g., chest, back)     No 3. ONSET: "When did the pain begin?" (e.g., minutes, hours or days ago)      About 2 weeks ago  4. SUDDEN: "Gradual or sudden onset?"     Gradual onset  5. PATTERN "Does the pain come and go, or is it constant?"    - If it comes and goes: "How long does it last?" "Do you have pain now?"     (Note: Comes and goes means the pain is intermittent. It goes away completely between bouts.)    - If constant: "Is it getting better, staying the same, or getting worse?"      (Note: Constant means  the pain never goes away completely; most serious pain is constant and gets worse.)      Comes and goes  6. SEVERITY: "How bad is the pain?"  (e.g., Scale 1-10; mild, moderate, or severe)    - MILD (1-3): Doesn't interfere with normal activities, abdomen soft and not tender to touch.     - MODERATE (4-7): Interferes with normal activities or awakens from sleep, abdomen tender to touch.     - SEVERE (8-10): Excruciating pain, doubled over, unable to do any normal activities.       6/10 7. RECURRENT SYMPTOM: "Have you ever had this type of stomach pain before?" If Yes, ask: "When was the last time?" and "What happened that time?"      Yes, I had c-diff at the end of January  8. CAUSE: "What do you think is causing the stomach pain?"     I'm not sure  9. RELIEVING/AGGRAVATING FACTORS: "What makes it better or worse?" (e.g., antacids, bending or twisting motion, bowel movement)     No 10. OTHER SYMPTOMS: "Do you have any other symptoms?" (e.g., back pain, diarrhea, fever, urination pain, vomiting)       Loss of appetite, nausea, fatigue  Protocols used: Abdominal Pain - Female-A-AH

## 2023-10-22 NOTE — Assessment & Plan Note (Signed)
Clinically stable.  No red flag signs or symptoms. Stable vital signs.  Benign abdominal examination. Diet and nutrition discussed.  Advised to stay well-hydrated Recommend to stay on BRAT diet for several more days

## 2023-10-22 NOTE — Progress Notes (Signed)
Hailey [name] 89 y.o.   Chief Complaint  Patient presents with   Abdominal Pain    Patient is having abdominal pain. Hailey [name] had CDIFF and Salmonella and states Hailey [name] was getting better then last night Hailey [name] had a bowel movement that was a lot not but wasn't diarrhea and also had a lot this morning and since then has been having stomachs cramps. Hailey [name] is still currently taking the abx and will be for 2 weeks    HISTORY OF PRESENT ILLNESS: Acute problem visit today.  Patient of Dr. Trinna Post Plotnikov. This is a 89 y.o. female complaining of abdominal cramping this morning. Recently diagnosed with C. difficile infection and started on Dificid 200 mg twice a day. Had a large bowel movement last night.  Formed stools.  No diarrhea.  Had another 1 this morning followed by lower abdominal cramps that did not last long and felt like menstrual cramps.  Able to eat and drink.  Denies nausea or vomiting.  Denies fever or chills. No other associated symptoms.  No other complaints or medical concerns today.  Abdominal Pain Pertinent negatives include no diarrhea, dysuria, fever, headaches, hematuria, nausea or vomiting.     Prior to Admission medications   Medication Sig Start Date End Date Taking? Authorizing Provider  acetaminophen (TYLENOL) 500 MG tablet Take 1,000 mg by mouth every 6 (six) hours as needed (body aches / sore hip). Maximum of 6 tablets daily (3000mg )   Yes [provider]  albuterol (VENTOLIN HFA) 108 (90 Base) MCG/ACT inhaler Inhale 2 puffs into the lungs every 6 (six) hours as needed for wheezing or shortness of breath. 09/02/23  Yes Corwin Levins, MD  BIOTIN PO Take 5,000 mcg by mouth in the morning.   Yes [provider]  clobetasol ointment (TEMOVATE) 0.05 % Apply to the affected area in a thin layer twice a day for 2 weeks as needed for a flare.  Then apply to the area twice a week at bedtime as maintenance dosing. 05/27/23  Yes Amundson Shirley Friar, MD   cyclobenzaprine (FLEXERIL) 10 MG tablet Take 1 tablet (10 mg total) by mouth 3 (three) times daily as needed for muscle spasms. 07/23/23  Yes Brenton Grills, MD  diclofenac Sodium (VOLTAREN) 1 % GEL Apply 2 g topically 4 (four) times daily as needed (back pain.).   Yes [provider]  dicyclomine (BENTYL) 20 MG tablet Take 1 by mouth every 4-6 hours as needed for cramping 06/04/23  Yes Hilarie Fredrickson, MD  ELIQUIS 2.5 MG TABS tablet TAKE 1 TABLET BY MOUTH TWICE  DAILY 09/16/23  Yes Lanier Prude, MD  empagliflozin (JARDIANCE) 10 MG TABS tablet Take 1 tablet (10 mg total) by mouth daily before breakfast. 06/05/23  Yes Weaver, Scott T, PA-C  escitalopram (LEXAPRO) 5 MG tablet Take 1 tablet (5 mg total) by mouth every evening. 05/20/23  Yes Plotnikov, Georgina Quint, MD  esomeprazole (NEXIUM) 40 MG capsule TAKE ONE CAPSULE ONCE DAILY 06/24/23  Yes Plotnikov, Georgina Quint, MD  fidaxomicin (DIFICID) 200 MG TABS tablet Take 1 tablet (200 mg total) by mouth 2 (two) times daily for 14 days. 10/16/23 10/30/23 Yes Jarold Motto, PA  Homeopathic Products (ARNICARE) GEL Apply 1 application  topically daily as needed (skin bruising).   Yes [provider]  hyoscyamine (LEVSIN) 0.125 MG tablet TAKE 1-2 TABLETS EVERY 4 HOURS AS NEEDED FOR UP TO 10 DAYS FOR CRAMPING. 05/06/23  Yes Plotnikov, Georgina Quint, MD  ipratropium (ATROVENT)  0.06 % nasal spray Place 2 sprays into the nose 4 (four) times daily. 08/19/23 08/18/24 Yes Plotnikov, Georgina Quint, MD  lidocaine (XYLOCAINE) 5 % ointment Apply 1 Application topically 3 (three) times daily. Uses as needed three times a day for pain of the vulva. 05/27/23  Yes Amundson Shirley Friar, MD  LORazepam (ATIVAN) 1 MG tablet TAKE ONE TABLET BY MOUTH TWICE DAILY AS NEEDED FOR ANXIETY / SLEEP. 06/07/23  Yes Plotnikov, Georgina Quint, MD  losartan (COZAAR) 25 MG tablet Take 1 tablet (25 mg total) by mouth daily. 06/06/23  Yes Orbie Pyo, MD  nitroGLYCERIN (NITROSTAT) 0.4 MG SL tablet  Place 1 tablet (0.4 mg total) under the tongue every 5 (five) minutes as needed for chest pain (Call 911 at 3rd dose within 15 minutes.). 10/01/22  Yes Plotnikov, Georgina Quint, MD  ondansetron (ZOFRAN-ODT) 8 MG disintegrating tablet dissolve 1 tablet (8 mg total) in mouth every 8 (eight) hours as needed for nausea or vomiting. 09/23/23  Yes Plotnikov, Georgina Quint, MD  Polyethyl Glycol-Propyl Glycol (LUBRICANT EYE DROPS) 0.4-0.3 % SOLN Place 1-2 drops into both eyes at bedtime.   Yes [provider]  polyethylene glycol (MIRALAX / GLYCOLAX) 17 g packet Take 17 g by mouth as needed for mild constipation or moderate constipation.   Yes [provider]  sodium chloride (OCEAN) 0.65 % SOLN nasal spray Place 1 spray into both nostrils at bedtime as needed for congestion.   Yes [provider]    Allergies  Allergen Reactions   Macrobid [Nitrofurantoin Monohyd Macro] Other (See Comments)    Nausea, stomach cramps, fatigue , headache.   Meloxicam Other (See Comments)    Jittery and headache   Digoxin And Related Other (See Comments)    headaches   Diprolene [Betamethasone Dipropionate Aug] Other (See Comments)    Patient states that the bruising was made worse when using this   Ferrous Sulfate Other (See Comments)    Bad constipation    Keflex [Cephalexin] Nausea And Vomiting    Nausea, fatigue, and headache.   Lipitor [Atorvastatin]     Abd pain   Mobic [Meloxicam] Other (See Comments)    Unsure of reaction type   Sulfamethoxazole-Trimethoprim Nausea Only    Patient Active Problem List   Diagnosis Date Noted   Supraclavicular fossa fullness 09/24/2023   COVID-19 09/24/2023   Cough 06/28/2023   Degenerative tear of left medial meniscus 06/20/2023   History of skin cancer 05/20/2023   Melanocytic nevi of trunk 05/20/2023   Seborrheic keratoses 05/20/2023   Squamous cell carcinoma of skin of right lower extremity 05/20/2023   Mitral regurgitation 02/22/2023    Aneurysm of ascending aorta without rupture (HCC) 02/18/2023   Cellulitis of left leg 02/18/2023   Closed comminuted fracture of left humerus 02/07/2023   Bradycardia 02/07/2023   Fall 02/07/2023   Chronic a-fib (HCC) 02/07/2023   Statin intolerance 12/31/2022   Acute pain of right knee 12/06/2022   Impetigo 10/01/2022   Insomnia 10/01/2022   Osteoporosis 06/11/2022   Weakness 04/25/2022   Weakness generalized 04/25/2022   Falls frequently 04/19/2022   Iron deficiency anemia 12/08/2021   Skin ulcer, limited to breakdown of skin (HCC) 11/22/2021   Anemia, iron deficiency 11/22/2021   Low serum vitamin B12 11/22/2021   Acute renal failure superimposed on stage 2 chronic kidney disease (HCC) 11/20/2021   ABLA (acute blood loss anemia) 11/20/2021   Hypotension 11/19/2021   Shortness of breath 06/12/2021   Right  leg pain 02/21/2021   Abnormal CT of the abdomen    Aortic atherosclerosis (HCC) 08/27/2020   Acute prerenal azotemia 08/27/2020   Shock circulatory (HCC) 08/27/2020   Metabolic acidosis with normal anion gap and bicarbonate losses 08/27/2020   Abdominal cramps 03/16/2020   Neurogenic orthostatic hypotension (HCC) 09/16/2019   Greater trochanteric pain syndrome of right lower extremity 07/30/2019   Abdominal pain 05/22/2018   Bradycardia with 41-50 beats per minute 12/27/2017   Right bundle branch block (RBBB) on electrocardiogram (ECG) 12/27/2017   Chronic kidney disease, stage 3b (HCC) 12/24/2017   Cellulitis of leg, right with large prepatella hematoma and open wounds 12/24/2017   Iliotibial band syndrome of right side 12/16/2017   Intervertebral lumbar disc disorder with myelopathy, lumbar region 04/25/2017   Family history of colon cancer in mother 01/22/2017   Primary osteoarthritis of both first carpometacarpal joints 01/02/2017   Pain 11/21/2016   Gastroenteritis 09/13/2016   Chronic anticoagulation 05/24/2015   Coronary artery disease involving native coronary  artery of native heart with angina pectoris (HCC) 12/20/2014   Permanent atrial fibrillation (HCC) 11/21/2012   Greater trochanteric bursitis of both hips 05/21/2012   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   Bruising 12/18/2010   Osteoarthritis of left hip 07/03/2010   DEGENERATIVE DISC DISEASE, LUMBOSACRAL SPINE 05/18/2010   SYNCOPE 10/27/2008   IBS 05/21/2008   Essential hypertension 06/16/2007   GERD (gastroesophageal reflux disease) 06/16/2007    Past Medical History:  Diagnosis Date   Acute blood loss anemia 12/25/2017   Allergic rhinitis    Anxiety    Arthritis    "my whole spine" (07/01/2017)   Arthritis    Atrial fibrillation (HCC)    Bowel obstruction (HCC)    in [place]   Bradycardia with 41-50 beats per minute 12/27/2017   Cancer (HCC)    Cellulitis of leg, right with large prepatella hematoma and open wounds 12/26/2017   CKD (chronic kidney disease) stage 2, GFR 60-89 ml/min    Colon polyps    Coronary artery disease    10/18 PCI/DES to p/m LCx with cutting balloon to mLcx   Diverticulosis of colon    GERD (gastroesophageal reflux disease)    Hip bursitis 2010   Dr Wyline Mood, Post op seroma   History of colon polyps    HSV (herpes simplex virus) anogenital infection 07/2019   HTN (hypertension)    IBS (irritable bowel syndrome)    constipation predominant - Dr Kinnie Scales   Lichen sclerosus    Osteopenia 11/2016   T score -2.0 FRAX 15%/4.3%   PAC (premature atrial contraction)    Symptomatiic   Renal insufficiency    Right bundle branch block (RBBB) on electrocardiogram (ECG) 12/27/2017   Scoliosis    SVT (supraventricular tachycardia) (HCC)    brief history   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   biopsy showing vulvar atypia, possible VIN I    Past Surgical History:  Procedure Laterality Date   ANTERIOR AND POSTERIOR VAGINAL REPAIR  01/2002   Hattie Perch 01/23/2011   APPENDECTOMY  1946   CARDIAC CATHETERIZATION  06/26/2017   CORONARY  ANGIOPLASTY WITH STENT PLACEMENT  07/01/2017   CORONARY ATHERECTOMY N/A 07/01/2017   Procedure: CORONARY ATHERECTOMY;  Surgeon: Lyn Records, MD;  Location: [facility] INVASIVE CV [facility];  Service: Cardiovascular;  Laterality: N/A;   CORONARY STENT INTERVENTION N/A 07/01/2017   Procedure: CORONARY STENT INTERVENTION;  Surgeon: Lyn Records, MD;  Location: [facility];  Service: Cardiovascular;  Laterality: N/A;   HAMMER TOE SURGERY     HEMORRHOID BANDING     HIP SURGERY Left 04/2009   hip examination under anesthesia followed by greater trochanteric bursectomy; iliotibial band tenotomy/notes 01/20/2011   I & D EXTREMITY Right 01/10/2018   Procedure: IRRIGATION AND DEBRIDEMENT RIGHT KNEE, APPLY WOUND VAC;  Surgeon: Nadara Mustard, MD;  Location: [facility] OR;  Service: Orthopedics;  Laterality: Right;   KNEE BURSECTOMY Right 04/2009   Hattie Perch 01/09/2011   LEFT ATRIAL APPENDAGE OCCLUSION N/A 08/24/2021   Procedure: LEFT ATRIAL APPENDAGE OCCLUSION;  Surgeon: Tonny Bollman, MD;  Location: [facility] INVASIVE CV LAB;  Service: Cardiovascular;  Laterality: N/A;   LEFT HEART CATH AND CORONARY ANGIOGRAPHY N/A 06/26/2017   Procedure: LEFT HEART CATH AND CORONARY ANGIOGRAPHY;  Surgeon: Lyn Records, MD;  Location: [facility];  Service: Cardiovascular;  Laterality: N/A;   PUBOVAGINAL SLING  01/2002   Hattie Perch 01/23/2011   REDUCTION MAMMAPLASTY     TEE WITHOUT CARDIOVERSION N/A 08/24/2021   Procedure: TRANSESOPHAGEAL ECHOCARDIOGRAM (TEE);  Surgeon: Tonny Bollman, MD;  Location: [facility] INVASIVE CV LAB;  Service: Cardiovascular;  Laterality: N/A;   TEMPORARY PACEMAKER N/A 07/01/2017   Procedure: TEMPORARY PACEMAKER;  Surgeon: Lyn Records, MD;  Location: [facility] INVASIVE CV LAB;  Service: Cardiovascular;  Laterality: N/A;   VAGINAL HYSTERECTOMY  01/2002   Vaginal hysterectomy, bilateral salpingo-oophorectomy/notes 01/23/2011    Social History   Socioeconomic History   Marital status: Married    Spouse name: [name]    Number of children: 2   Years of education: Not on file   Highest education level: Bachelor's degree (e.g., BA, AB, BS)  Occupational History   Occupation: Engineer, manufacturing systems: RETIRED  Tobacco Use   Smoking status: Never   Smokeless tobacco: Never  Vaping Use   Vaping status: Never Used  Substance and Sexual Activity   Alcohol use: Never    Comment: 7 vodka drinks a week   Drug use: Never   Sexual activity: Not Currently    Birth control/protection: Surgical    Comment: hysterectomy; less than 5, IC after 18, no STD, no abnormal paps  Other Topics Concern   Not on file  Social History Narrative   ** Merged History Encounter **       Regular Exercise -  YES         Social Drivers of Health   Financial Resource Strain: Low Risk  (02/26/2023)   Overall Financial Resource Strain (CARDIA)    Difficulty of Paying Living Expenses: Not hard at all  Food Insecurity: No Food Insecurity (02/26/2023)   Hunger Vital Sign    Worried About Running Out of Food in the Last Year: Never true    Ran Out of Food in the Last Year: Never true  Transportation Needs: No Transportation Needs (02/26/2023)   PRAPARE - Administrator, Civil Service (Medical): No    Lack of Transportation (Non-Medical): No  Physical Activity: Insufficiently Active (02/26/2023)   Exercise Vital Sign    Days of Exercise per Week: 3 days    Minutes of Exercise per Session: 40 min  Stress: Stress Concern Present (02/26/2023)   [employer] of Occupational Health - Occupational Stress Questionnaire    Feeling of Stress : To some extent  Social Connections: Moderately Integrated (02/26/2023)   Social Connection and Isolation Panel [NHANES]    Frequency of Communication with Friends and Family: More than three times a week  Frequency of Social Gatherings with Friends and Family: More than three times a week    Attends Religious Services: Never    Database administrator or Organizations: Yes     Attends Engineer, structural: More than 4 times per year    Marital Status: Married  Catering manager Violence: Not At Risk (02/26/2023)   Humiliation, Afraid, Rape, and Kick questionnaire    Fear of Current or Ex-Partner: No    Emotionally Abused: No    Physically Abused: No    Sexually Abused: No    Family History  Problem Relation Age of Onset   Colon cancer Mother        Dx age 35, died at age 21   Diabetes Father    Prostate cancer Father    Prostate cancer Brother    Pancreatic cancer Brother    Stomach cancer Son      Review of Systems  Constitutional: Negative.  Negative for chills and fever.  HENT: Negative.  Negative for congestion and sore throat.   Respiratory: Negative.  Negative for cough and shortness of breath.   Cardiovascular: Negative.  Negative for chest pain and palpitations.  Gastrointestinal:  Positive for abdominal pain. Negative for diarrhea, nausea and vomiting.  Genitourinary:  Negative for dysuria and hematuria.  Skin: Negative.  Negative for rash.  Neurological:  Negative for dizziness and headaches.  All other systems reviewed and are negative.   Vitals:   10/22/23 1358  BP: 130/88  Pulse: (!) 59  Temp: 98.5 F (36.9 C)  SpO2: 100%    Physical Exam Vitals reviewed.  Constitutional:      Appearance: Normal appearance.  HENT:     Mouth/Throat:     Mouth: Mucous membranes are moist.     Pharynx: Oropharynx is clear.  Eyes:     Extraocular Movements: Extraocular movements intact.     Pupils: Pupils are equal, round, and reactive to light.  Cardiovascular:     Rate and Rhythm: Normal rate and regular rhythm.     Pulses: Normal pulses.     Heart sounds: Normal heart sounds.  Pulmonary:     Effort: Pulmonary effort is normal.     Breath sounds: Normal breath sounds.  Abdominal:     General: There is no distension.     Palpations: Abdomen is soft.     Tenderness: There is no abdominal tenderness. There is no guarding.   Musculoskeletal:     Cervical back: No tenderness.  Lymphadenopathy:     Cervical: No cervical adenopathy.  Skin:    General: Skin is warm and dry.     Capillary Refill: Capillary refill takes less than 2 seconds.  Neurological:     General: No focal deficit present.     Mental Status: Hailey [name] is alert and oriented to person, place, and time.  Psychiatric:        Mood and Affect: Mood normal.        Behavior: Behavior normal.      ASSESSMENT & PLAN: A total of 40 minutes was spent with the patient and counseling/coordination of care regarding preparing for this visit, review of most recent office visit notes, review of multiple chronic medical conditions and their management, management of abdominal pain, diagnosis of C. difficile infection and treatment, review of all medications, review of most recent bloodwork results, review of health maintenance items, education on nutrition, prognosis, documentation, and need for follow up.  Problem List Items Addressed This Visit  Other   Abdominal cramps - Primary   Clinically stable.  No red flag signs or symptoms. Stable vital signs.  Benign abdominal examination. Diet and nutrition discussed.  Advised to stay well-hydrated Recommend to stay on BRAT diet for several more days      Clostridioides difficile infection   Responding well to treatment.  Continue Dificid 200 mg twice daily Diet and nutrition discussed.  No complications.      Patient Instructions  Abdominal Pain, Adult  Many things can cause belly (abdominal) pain. In most cases, belly pain is not a serious problem and can be watched and treated at home. But in some cases, it can be serious. Your doctor will try to find the cause of your belly pain. Follow these instructions at home: Medicines Take over-the-counter and prescription medicines only as told by your doctor. Do not take medicines that help you poop (laxatives) unless told by your doctor. General  instructions Watch your belly pain for any changes. Tell your doctor if the pain gets worse. Drink enough fluid to keep your pee (urine) pale yellow. Contact a doctor if: Your belly pain changes or gets worse. You have very bad cramping or bloating in your belly. You vomit. Your pain gets worse with meals, after eating, or with certain foods. You have trouble pooping or have watery poop for more than 2-3 days. You are not hungry, or you lose weight without trying. You have signs of not getting enough fluid or water (dehydration). These may include: Dark pee, very little pee, or no pee. Cracked lips or dry mouth. Feeling sleepy or weak. You have pain when you pee or poop. Your belly pain wakes you up at night. You have blood in your pee. You have a fever. Get help right away if: You cannot stop vomiting. Your pain is only in one part of your belly, like on the right side. You have bloody or black poop, or poop that looks like tar. You have trouble breathing. You have chest pain. These symptoms may be an emergency. Get help right away. Call 911. Do not wait to see if the symptoms will go away. Do not drive yourself to the hospital. This information is not intended to replace advice given to you by your health care provider. Make sure you discuss any questions you have with your health care provider. Document Revised: 06/13/2022 Document Reviewed: 06/13/2022 Elsevier Patient Education  2024 Elsevier Inc.       Edwina Barth, MD [facility]

## 2023-10-22 NOTE — Assessment & Plan Note (Signed)
Responding well to treatment.  Continue Dificid 200 mg twice daily Diet and nutrition discussed.  No complications.

## 2023-10-30 ENCOUNTER — Telehealth: Payer: Self-pay | Admitting: Physical Medicine and Rehabilitation

## 2023-10-30 ENCOUNTER — Encounter: Payer: Medicare Other | Admitting: Orthopedic Surgery

## 2023-10-30 NOTE — Telephone Encounter (Signed)
Pt called requesting an appt for injection. Last injection 06/19/23. Pt phone number is [phone].

## 2023-10-31 ENCOUNTER — Other Ambulatory Visit: Payer: Self-pay | Admitting: Physical Medicine and Rehabilitation

## 2023-10-31 DIAGNOSIS — M5416 Radiculopathy, lumbar region: Secondary | ICD-10-CM

## 2023-11-04 ENCOUNTER — Other Ambulatory Visit: Payer: Self-pay

## 2023-11-04 ENCOUNTER — Ambulatory Visit: Payer: Medicare Other | Admitting: Physical Medicine and Rehabilitation

## 2023-11-04 ENCOUNTER — Telehealth: Payer: Self-pay | Admitting: Physical Medicine and Rehabilitation

## 2023-11-04 VITALS — BP 144/86 | HR 78

## 2023-11-04 DIAGNOSIS — M7061 Trochanteric bursitis, right hip: Secondary | ICD-10-CM | POA: Diagnosis not present

## 2023-11-04 DIAGNOSIS — M48061 Spinal stenosis, lumbar region without neurogenic claudication: Secondary | ICD-10-CM

## 2023-11-04 DIAGNOSIS — M7631 Iliotibial band syndrome, right leg: Secondary | ICD-10-CM | POA: Diagnosis not present

## 2023-11-04 DIAGNOSIS — M7062 Trochanteric bursitis, left hip: Secondary | ICD-10-CM

## 2023-11-04 DIAGNOSIS — M47816 Spondylosis without myelopathy or radiculopathy, lumbar region: Secondary | ICD-10-CM | POA: Diagnosis not present

## 2023-11-04 DIAGNOSIS — M5416 Radiculopathy, lumbar region: Secondary | ICD-10-CM

## 2023-11-04 MED ORDER — METHYLPREDNISOLONE ACETATE 40 MG/ML IJ SUSP
40.0000 mg | Freq: Once | INTRAMUSCULAR | Status: AC
  Administered 2023-11-04: 40 mg

## 2023-11-04 NOTE — Patient Instructions (Signed)

## 2023-11-04 NOTE — Progress Notes (Unsigned)
 Pain Score---9 No Allergies to Contrast Dye Patient takes Eliquis

## 2023-11-04 NOTE — Telephone Encounter (Signed)
 Pt called requesting a call concerning an earlier appt with Dr Alvester Morin. Please call pt at [phone].

## 2023-11-06 ENCOUNTER — Encounter: Payer: Self-pay | Admitting: Physical Medicine and Rehabilitation

## 2023-11-06 NOTE — Progress Notes (Signed)
 Hailey [name] - 89 y.o. female MRN [id number]  Date of birth: 12/29/1934  Office Visit Note: Visit Date: 11/04/2023 PCP: Tresa Garter, MD Referred by: Tresa Garter, MD  Subjective: Chief Complaint  Patient presents with   Lower Back - Pain   HPI: Hailey [name] is a 89 y.o. female who comes in today for evaluation and management HTN management of more recent exacerbation of chronic low back and bilateral hip and thigh pain.  Her pain she tells me today is more severe than really its ever been and she rates her pain as a 9 out of 10.  She reports recently she is really limited in the amount of functioning she can do and is pretty distraught with the amount of pain she is having.  This is despite use of medications and therapy etc.  She has 2 problems she does have some lumbar stenosis at L3-4 and lateral recess narrowing there and also foraminal narrowing at L4.  MRIs from 2023.  This is reviewed again again and reviewed down below.  Last time we saw her was in October we completed epidural injection at L4 with 4 with good relief.  She has a hard time distinguishing between her greater trochanteric pain syndrome and iliotibial band syndrome and this lumbar spine issue.  She does see sports medicine and has injections at times in the iliotibial band and greater trochanters.  She also undergoes physical therapy and continues with home exercise program quite significantly.  She tries to stay active.  She is very concerned about the level of pain she is having.  No specific new trauma or focal weakness or really red flag complaints overall.  No groin pain.   I spent more than 30 minutes speaking face-to-face with the patient with 50% of the time in counseling and discussing coordination of care.      Review of Systems  Musculoskeletal:  Positive for back pain and joint pain.  Neurological:  Positive for weakness.  All other systems reviewed and are negative.  Otherwise per  HPI.  Assessment & Plan: Visit Diagnoses:    ICD-10-CM   1. Lumbar radiculopathy  M54.16 XR C-ARM NO REPORT    Epidural Steroid injection    methylPREDNISolone acetate (DEPO-MEDROL) injection 40 mg    2. Foraminal stenosis of lumbar region  M48.061     3. Spondylosis without myelopathy or radiculopathy, lumbar region  M47.816     4. Greater trochanteric bursitis of both hips  M70.61    M70.62     5. Iliotibial band syndrome of right side  M76.31        Plan: Findings:  1.  Low back pain worse with standing and ambulating somewhat of a neurogenic claudication type symptomatology with moderate stenosis at L3-4 centrally and some lateral recess and foraminal more right than left at L4.  Good relief in the past with transforaminal injection.  I do think some of this pain she is having is from the spine issues.  She likely has some underlying pain syndrome as well.  I think the best approach today to the severity of symptoms is bilateral L4 transforaminal injection.  Continue with exercise program and medication.  Her case is complicated by anticoagulation therapy which is okay for the injection today.  2.  In terms of her hips and iliotibial band and greater trochanteric pain she can continue to follow-up with sports medicine.  I think this may be all related to some  degree as 1 may exacerbate the other and some of the referred pain from the spine may be what she refers to the iliotibial band but it is hard to say.  She will continue with home exercise and physical therapy for this.  If she gets no relief in these areas from the injection today then we would look at this is more of a musculoskeletal complaint.  Again likely has maybe an underlying pain syndrome.    Meds & Orders:  Meds ordered this encounter  Medications   methylPREDNISolone acetate (DEPO-MEDROL) injection 40 mg    Orders Placed This Encounter  Procedures   XR C-ARM NO REPORT   Epidural Steroid injection    Follow-up:  Return if symptoms worsen or fail to improve.   Procedures: No procedures performed  Lumbosacral Transforaminal Epidural Steroid Injection - Sub-Pedicular Approach with Fluoroscopic Guidance  Patient: Hailey [name]      Date of Birth: 05-10-1935 MRN: [id number] PCP: Tresa Garter, MD      Visit Date: 11/04/2023   Universal Protocol:    Date/Time: 11/04/2023  Consent Given By: the patient  Position: PRONE  Additional Comments: Vital signs were monitored before and after the procedure. Patient was prepped and draped in the usual sterile fashion. The correct patient, procedure, and site was verified.   Injection Procedure Details:   Procedure diagnoses: Lumbar radiculopathy [M54.16]    Meds Administered:  Meds ordered this encounter  Medications   methylPREDNISolone acetate (DEPO-MEDROL) injection 40 mg    Laterality: Bilateral  Location/Site: L4  Needle:5.0 in., 22 ga.  Short bevel or Quincke spinal needle  Needle Placement: Transforaminal  Findings:    -Comments: Excellent flow of contrast along the nerve, nerve root and into the epidural space.  Procedure Details: After squaring off the end-plates to get a true AP view, the C-arm was positioned so that an oblique view of the foramen as noted above was visualized. The target area is just inferior to the "nose of the scotty dog" or sub pedicular. The soft tissues overlying this structure were infiltrated with 2-3 ml. of 1% Lidocaine without Epinephrine.  The spinal needle was inserted toward the target using a "trajectory" view along the fluoroscope beam.  Under AP and lateral visualization, the needle was advanced so it did not puncture dura and was located close the 6 O'Clock position of the pedical in AP tracterory. Biplanar projections were used to confirm position. Aspiration was confirmed to be negative for CSF and/or blood. A 1-2 ml. volume of Isovue-250 was injected and flow of contrast was noted at each  level. Radiographs were obtained for documentation purposes.   After attaining the desired flow of contrast documented above, a 0.5 to 1.0 ml test dose of 0.25% Marcaine was injected into each respective transforaminal space.  The patient was observed for 90 seconds post injection.  After no sensory deficits were reported, and normal lower extremity motor function was noted,   the above injectate was administered so that equal amounts of the injectate were placed at each foramen (level) into the transforaminal epidural space.   Additional Comments:  No complications occurred Dressing: 2 x 2 sterile gauze and Band-Aid    Post-procedure details: Patient was observed during the procedure. Post-procedure instructions were reviewed.  Patient left the clinic in stable condition.    Clinical History: EXAM: MRI LUMBAR SPINE WITHOUT CONTRAST   TECHNIQUE: Multiplanar, multisequence MR imaging of the lumbar spine was performed. No intravenous contrast was administered.  COMPARISON:  MR lumbar 03/20/2017; X-ray lumbar 09/14/2021.   FINDINGS: Segmentation:  Standard.   Alignment: Scoliotic thoracolumbar curvature. Mild retrolisthesis at L1-2, L2-3, and L3-4.   Vertebrae: No fracture, evidence of discitis, or suspicious bone lesion. Scattered intraosseous hemangiomas.   Conus medullaris and cauda equina: Conus extends to the L1 level. Conus and cauda equina appear normal.   Paraspinal and other soft tissues: No acute findings.   Disc levels:   T12-L1: Mild disc bulge. No foraminal or canal stenosis. Unchanged.   L1-L2: Disc height loss with mild disc bulge and endplate ridging. Mild left foraminal stenosis. No canal stenosis. Unchanged.   L2-L3: Retrolisthesis. Diffuse disc bulge with small caudally extending right paracentral disc extrusion. Mild bilateral facet arthropathy. Mild canal stenosis with moderate bilateral foraminal stenosis. Findings have progressed from prior.    L3-L4: Diffuse disc bulge with mild bilateral facet arthropathy and ligamentum flavum buckling. Findings result in mild-moderate canal stenosis with moderate right foraminal stenosis. Findings progressed from prior.   L4-L5: Disc bulge with endplate osteophytic ridging. Advanced bilateral facet arthropathy. Severe right and mild left foraminal stenosis. No significant interval progression.   L5-S1: Disc height loss and endplate ridging. Mild facet hypertrophy. No canal stenosis. Mild-to-moderate left and mild right foraminal stenosis. Unchanged.   IMPRESSION: 1. Multilevel lumbar spondylosis, slightly progressed from prior MRI. 2. Mild-moderate canal stenosis with moderate right foraminal stenosis at L3-4. 3. Mild canal stenosis and moderate bilateral foraminal stenosis at L2-3. 4. Severe right and mild left foraminal stenosis at L4-5.     Electronically Signed   By: Duanne Guess D.O.   On: 11/29/2021 13:23   She reports that she has never smoked. She has never used smokeless tobacco.  Recent Labs    02/08/23 0701  HGBA1C 5.5    Objective:  VS:  HT:    WT:   BMI:     BP:(!) 144/86  HR:78bpm  TEMP: ( )  RESP:  Physical Exam Vitals and nursing note reviewed.  Constitutional:      General: She is not in acute distress.    Appearance: Normal appearance. She is well-developed. She is not ill-appearing.  HENT:     Head: Normocephalic and atraumatic.     Right Ear: External ear normal.     Left Ear: External ear normal.  Eyes:     Extraocular Movements: Extraocular movements intact.     Conjunctiva/sclera: Conjunctivae normal.     Pupils: Pupils are equal, round, and reactive to light.  Cardiovascular:     Rate and Rhythm: Normal rate.     Pulses: Normal pulses.  Pulmonary:     Effort: Pulmonary effort is normal. No respiratory distress.  Abdominal:     General: There is no distension.     Palpations: Abdomen is soft.  Musculoskeletal:        General:  Tenderness present.     Cervical back: Neck supple.     Right lower leg: No edema.     Left lower leg: No edema.     Comments: Patient has good distal strength with  pain over the greater trochanters.  No clonus or focal weakness.  Skin:    General: Skin is warm and dry.     Findings: No erythema, lesion or rash.  Neurological:     General: No focal deficit present.     Mental Status: She is alert and oriented to person, place, and time.     Cranial Nerves: No cranial nerve deficit.  Sensory: No sensory deficit.     Motor: No weakness or abnormal muscle tone.     Coordination: Coordination normal.     Gait: Gait abnormal.  Psychiatric:        Mood and Affect: Mood normal.        Behavior: Behavior normal.     Ortho Exam  Imaging: No results found.  Past Medical/Family/Surgical/Social History: Medications & Allergies reviewed per EMR, new medications updated. Patient Active Problem List   Diagnosis Date Noted   Clostridioides difficile infection 10/22/2023   Supraclavicular fossa fullness 09/24/2023   COVID-19 09/24/2023   Cough 06/28/2023   Degenerative tear of left medial meniscus 06/20/2023   History of skin cancer 05/20/2023   Melanocytic nevi of trunk 05/20/2023   Seborrheic keratoses 05/20/2023   Squamous cell carcinoma of skin of right lower extremity 05/20/2023   Mitral regurgitation 02/22/2023   Aneurysm of ascending aorta without rupture (HCC) 02/18/2023   Cellulitis of left leg 02/18/2023   Closed comminuted fracture of left humerus 02/07/2023   Bradycardia 02/07/2023   Fall 02/07/2023   Chronic a-fib (HCC) 02/07/2023   Statin intolerance 12/31/2022   Acute pain of right knee 12/06/2022   Impetigo 10/01/2022   Insomnia 10/01/2022   Osteoporosis 06/11/2022   Weakness 04/25/2022   Weakness generalized 04/25/2022   Falls frequently 04/19/2022   Iron deficiency anemia 12/08/2021   Skin ulcer, limited to breakdown of skin (HCC) 11/22/2021   Anemia, iron  deficiency 11/22/2021   Low serum vitamin B12 11/22/2021   Acute renal failure superimposed on stage 2 chronic kidney disease (HCC) 11/20/2021   ABLA (acute blood loss anemia) 11/20/2021   Hypotension 11/19/2021   Shortness of breath 06/12/2021   Right leg pain 02/21/2021   Abnormal CT of the abdomen    Aortic atherosclerosis (HCC) 08/27/2020   Acute prerenal azotemia 08/27/2020   Shock circulatory (HCC) 08/27/2020   Metabolic acidosis with normal anion gap and bicarbonate losses 08/27/2020   Abdominal cramps 03/16/2020   Neurogenic orthostatic hypotension (HCC) 09/16/2019   Greater trochanteric pain syndrome of right lower extremity 07/30/2019   Abdominal pain 05/22/2018   Bradycardia with 41-50 beats per minute 12/27/2017   Right bundle branch block (RBBB) on electrocardiogram (ECG) 12/27/2017   Chronic kidney disease, stage 3b (HCC) 12/24/2017   Cellulitis of leg, right with large prepatella hematoma and open wounds 12/24/2017   Iliotibial band syndrome of right side 12/16/2017   Intervertebral lumbar disc disorder with myelopathy, lumbar region 04/25/2017   Family history of colon cancer in mother 01/22/2017   Primary osteoarthritis of both first carpometacarpal joints 01/02/2017   Pain 11/21/2016   Gastroenteritis 09/13/2016   Chronic anticoagulation 05/24/2015   Coronary artery disease involving native coronary artery of native heart with angina pectoris (HCC) 12/20/2014   Permanent atrial fibrillation (HCC) 11/21/2012   Greater trochanteric bursitis of both hips 05/21/2012   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   Bruising 12/18/2010   Osteoarthritis of left hip 07/03/2010   DEGENERATIVE DISC DISEASE, LUMBOSACRAL SPINE 05/18/2010   SYNCOPE 10/27/2008   IBS 05/21/2008   Essential hypertension 06/16/2007   GERD (gastroesophageal reflux disease) 06/16/2007   Past Medical History:  Diagnosis Date   Acute blood loss anemia 12/25/2017   Allergic rhinitis    Anxiety     Arthritis    "my whole spine" (07/01/2017)   Arthritis    Atrial fibrillation (HCC)    Bowel obstruction (HCC)    in [place]   Bradycardia with 41-50  beats per minute 12/27/2017   Cancer (HCC)    Cellulitis of leg, right with large prepatella hematoma and open wounds 12/26/2017   CKD (chronic kidney disease) stage 2, GFR 60-89 ml/min    Colon polyps    Coronary artery disease    10/18 PCI/DES to p/m LCx with cutting balloon to mLcx   Diverticulosis of colon    GERD (gastroesophageal reflux disease)    Hip bursitis 2010   Dr Wyline Mood, Post op seroma   History of colon polyps    HSV (herpes simplex virus) anogenital infection 07/2019   HTN (hypertension)    IBS (irritable bowel syndrome)    constipation predominant - Dr Kinnie Scales   Lichen sclerosus    Osteopenia 11/2016   T score -2.0 FRAX 15%/4.3%   PAC (premature atrial contraction)    Symptomatiic   Renal insufficiency    Right bundle branch block (RBBB) on electrocardiogram (ECG) 12/27/2017   Scoliosis    SVT (supraventricular tachycardia) (HCC)    brief history   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   biopsy showing vulvar atypia, possible VIN I   Family History  Problem Relation Age of Onset   Colon cancer Mother        Dx age 58, died at age 59   Diabetes Father    Prostate cancer Father    Prostate cancer Brother    Pancreatic cancer Brother    Stomach cancer Son    Past Surgical History:  Procedure Laterality Date   ANTERIOR AND POSTERIOR VAGINAL REPAIR  01/2002   Hattie Perch 01/23/2011   APPENDECTOMY  1948   CARDIAC CATHETERIZATION  06/26/2017   CORONARY ANGIOPLASTY WITH STENT PLACEMENT  07/01/2017   CORONARY ATHERECTOMY N/A 07/01/2017   Procedure: CORONARY ATHERECTOMY;  Surgeon: Lyn Records, MD;  Location: [facility] INVASIVE CV LAB;  Service: Cardiovascular;  Laterality: N/A;   CORONARY STENT INTERVENTION N/A 07/01/2017   Procedure: CORONARY STENT INTERVENTION;  Surgeon: Lyn Records, MD;  Location: [facility]  INVASIVE CV LAB;  Service: Cardiovascular;  Laterality: N/A;   HAMMER TOE SURGERY     HEMORRHOID BANDING     HIP SURGERY Left 04/2009   hip examination under anesthesia followed by greater trochanteric bursectomy; iliotibial band tenotomy/notes 01/20/2011   I & D EXTREMITY Right 01/10/2018   Procedure: IRRIGATION AND DEBRIDEMENT RIGHT KNEE, APPLY WOUND VAC;  Surgeon: Nadara Mustard, MD;  Location: [facility] OR;  Service: Orthopedics;  Laterality: Right;   KNEE BURSECTOMY Right 04/2009   Hattie Perch 01/09/2011   LEFT ATRIAL APPENDAGE OCCLUSION N/A 08/24/2021   Procedure: LEFT ATRIAL APPENDAGE OCCLUSION;  Surgeon: Tonny Bollman, MD;  Location: [facility] INVASIVE CV LAB;  Service: Cardiovascular;  Laterality: N/A;   LEFT HEART CATH AND CORONARY ANGIOGRAPHY N/A 06/26/2017   Procedure: LEFT HEART CATH AND CORONARY ANGIOGRAPHY;  Surgeon: Lyn Records, MD;  Location: [facility] INVAS[facility] CV [facility];  Service: Cardiovascular;  Laterality: N/A;   PUBOVAGINAL SLING  01/2002   Hattie Perch 01/23/2011   REDUCTION MAMMAPLASTY     TEE WITHOUT CARDIOVERSION N/A 08/24/2021   Procedure: TRANSESOPHAGEAL ECHOCARDIOGRAM (TEE);  Surgeon: Tonny Bollman, MD;  Location: [facility] INVASIVE CV LAB;  Service: Cardiovascular;  Laterality: N/A;   TEMPORARY PACEMAKER N/A 07/01/2017   Procedure: TEMPORARY PACEMAKER;  Surgeon: Lyn Records, MD;  Location: [facility] INVASIVE CV LAB;  Service: Cardiovascular;  Laterality: N/A;   VAGINAL HYSTERECTOMY  01/2002   Vaginal hysterectomy, bilateral salpingo-oophorectomy/notes 01/23/2011   Social History   Occupational History  Occupation: Engineer, manufacturing systems: RETIRED  Tobacco Use   Smoking status: Never   Smokeless tobacco: Never  Vaping Use   Vaping status: Never Used  Substance and Sexual Activity   Alcohol use: Never    Comment: 7 vodka drinks a week   Drug use: Never   Sexual activity: Not Currently    Birth control/protection: Surgical    Comment: hysterectomy; less than 5, IC after 16, no STD, no abnormal  paps

## 2023-11-06 NOTE — Procedures (Signed)
 Lumbosacral Transforaminal Epidural Steroid Injection - Sub-Pedicular Approach with Fluoroscopic Guidance  Patient: Hailey [name]      Date of Birth: 07-06-1935 MRN: [id number] PCP: Tresa Garter, MD      Visit Date: 11/04/2023   Universal Protocol:    Date/Time: 11/04/2023  Consent Given By: the patient  Position: PRONE  Additional Comments: Vital signs were monitored before and after the procedure. Patient was prepped and draped in the usual sterile fashion. The correct patient, procedure, and site was verified.   Injection Procedure Details:   Procedure diagnoses: Lumbar radiculopathy [M54.16]    Meds Administered:  Meds ordered this encounter  Medications   methylPREDNISolone acetate (DEPO-MEDROL) injection 40 mg    Laterality: Bilateral  Location/Site: L4  Needle:5.0 in., 22 ga.  Short bevel or Quincke spinal needle  Needle Placement: Transforaminal  Findings:    -Comments: Excellent flow of contrast along the nerve, nerve root and into the epidural space.  Procedure Details: After squaring off the end-plates to get a true AP view, the C-arm was positioned so that an oblique view of the foramen as noted above was visualized. The target area is just inferior to the "nose of the scotty dog" or sub pedicular. The soft tissues overlying this structure were infiltrated with 2-3 ml. of 1% Lidocaine without Epinephrine.  The spinal needle was inserted toward the target using a "trajectory" view along the fluoroscope beam.  Under AP and lateral visualization, the needle was advanced so it did not puncture dura and was located close the 6 O'Clock position of the pedical in AP tracterory. Biplanar projections were used to confirm position. Aspiration was confirmed to be negative for CSF and/or blood. A 1-2 ml. volume of Isovue-250 was injected and flow of contrast was noted at each level. Radiographs were obtained for documentation purposes.   After attaining the  desired flow of contrast documented above, a 0.5 to 1.0 ml test dose of 0.25% Marcaine was injected into each respective transforaminal space.  The patient was observed for 90 seconds post injection.  After no sensory deficits were reported, and normal lower extremity motor function was noted,   the above injectate was administered so that equal amounts of the injectate were placed at each foramen (level) into the transforaminal epidural space.   Additional Comments:  No complications occurred Dressing: 2 x 2 sterile gauze and Band-Aid    Post-procedure details: Patient was observed during the procedure. Post-procedure instructions were reviewed.  Patient left the clinic in stable condition.

## 2023-11-12 ENCOUNTER — Encounter: Payer: Medicare Other | Admitting: Physical Medicine and Rehabilitation

## 2023-11-13 DIAGNOSIS — H353132 Nonexudative age-related macular degeneration, bilateral, intermediate dry stage: Secondary | ICD-10-CM | POA: Diagnosis not present

## 2023-11-14 ENCOUNTER — Other Ambulatory Visit: Payer: Self-pay

## 2023-11-14 ENCOUNTER — Encounter: Payer: Self-pay | Admitting: Orthopedic Surgery

## 2023-11-14 ENCOUNTER — Ambulatory Visit: Payer: Medicare Other | Admitting: Orthopedic Surgery

## 2023-11-14 DIAGNOSIS — M25512 Pain in left shoulder: Secondary | ICD-10-CM | POA: Diagnosis not present

## 2023-11-14 NOTE — Progress Notes (Signed)
 Office Visit Note   Patient: Hailey [name]           Date of Birth: [birth year]           MRN: [id number] Visit Date: 11/14/2023 Requested by: Myrene Galas, MD [address] [place],  [place] [address] PCP: Plotnikov, Georgina Quint, MD  Subjective: Chief Complaint  Patient presents with   Left Shoulder - Pain    HPI: Hailey [name] is a 89 y.o. female who presents to the office reporting left shoulder pain.  She describes having a fall with proximal humerus fracture treated nonoperatively approximately 1 year ago.  Right shoulder functions well.  Left shoulder she has difficulty lifting anything near or at shoulder level.  Has sharp pain.  Has difficulty loading the dishwasher.  Takes Tylenol 3 times a day.  She is on anticoagulants for atrial fibrillation.  Does well with back injections with Dr. Alvester Morin.                ROS: All systems reviewed are negative as they relate to the chief complaint within the history of present illness.  Patient denies fevers or chills.  Assessment & Plan: Visit Diagnoses:  1. Left shoulder pain, unspecified chronicity     Plan: Impression is posttraumatic deformity from healed proximal humerus fracture.  Humeral head is healed into some varus.  This is making it difficult for her to achieve range of motion near or beyond 90 degrees of forward flexion and abduction.  Talked with her at length about nonoperative and operative options.  She is more inclined to try an intra-articular glenohumeral joint injection which is performed today.  Will see her back in about 3 months for clinical recheck.  Follow-Up Instructions: No follow-ups on file.   Orders:  Orders Placed This Encounter  Procedures   US Guided Needle Placement - No Linked Charges   No orders of the defined types were placed in this encounter.     Procedures: No procedures performed   Clinical Data: No additional findings.  Objective: Vital Signs: There were no vitals taken for  this visit.  Physical Exam:  Constitutional: Patient appears well-developed HEENT:  Head: Normocephalic Eyes:EOM are normal Neck: Normal range of motion Cardiovascular: Normal rate Pulmonary/chest: Effort normal Neurologic: Patient is alert Skin: Skin is warm Psychiatric: Patient has normal mood and affect  Ortho Exam: Ortho exam demonstrates range of motion on the right of 70/110/175.  On the left range of motion is 10/70/90 passively.  Actively the patient can get only to about 50 degrees of forward flexion and abduction.  Motor or sensory function of the hand is intact.  External and internal rotation strength is actually symmetric between sides at 5+ out of 5 to supraspinatus and infraspinatus and subscap testing.  Specialty Comments:  EXAM: MRI LUMBAR SPINE WITHOUT CONTRAST   TECHNIQUE: Multiplanar, multisequence MR imaging of the lumbar spine was performed. No intravenous contrast was administered.   COMPARISON:  MR lumbar 03/20/2017; X-ray lumbar 09/14/2021.   FINDINGS: Segmentation:  Standard.   Alignment: Scoliotic thoracolumbar curvature. Mild retrolisthesis at L1-2, L2-3, and L3-4.   Vertebrae: No fracture, evidence of discitis, or suspicious bone lesion. Scattered intraosseous hemangiomas.   Conus medullaris and cauda equina: Conus extends to the L1 level. Conus and cauda equina appear normal.   Paraspinal and other soft tissues: No acute findings.   Disc levels:   T12-L1: Mild disc bulge. No foraminal or canal stenosis. Unchanged.   L1-L2: Disc  height loss with mild disc bulge and endplate ridging. Mild left foraminal stenosis. No canal stenosis. Unchanged.   L2-L3: Retrolisthesis. Diffuse disc bulge with small caudally extending right paracentral disc extrusion. Mild bilateral facet arthropathy. Mild canal stenosis with moderate bilateral foraminal stenosis. Findings have progressed from prior.   L3-L4: Diffuse disc bulge with mild bilateral facet  arthropathy and ligamentum flavum buckling. Findings result in mild-moderate canal stenosis with moderate right foraminal stenosis. Findings progressed from prior.   L4-L5: Disc bulge with endplate osteophytic ridging. Advanced bilateral facet arthropathy. Severe right and mild left foraminal stenosis. No significant interval progression.   L5-S1: Disc height loss and endplate ridging. Mild facet hypertrophy. No canal stenosis. Mild-to-moderate left and mild right foraminal stenosis. Unchanged.   IMPRESSION: 1. Multilevel lumbar spondylosis, slightly progressed from prior MRI. 2. Mild-moderate canal stenosis with moderate right foraminal stenosis at L3-4. 3. Mild canal stenosis and moderate bilateral foraminal stenosis at L2-3. 4. Severe right and mild left foraminal stenosis at L4-5.     Electronically Signed   By: Duanne Guess D.O.   On: 11/29/2021 13:23  Imaging: US Guided Needle Placement - No Linked Charges Result Date: 11/14/2023 Ultrasound imaging demonstrates needle placement into the glenohumeral joint with injection of fluid into the joint and no complicating features.  Left shoulder    PMFS History: Patient Active Problem List   Diagnosis Date Noted   Clostridioides difficile infection 10/22/2023   Supraclavicular fossa fullness 09/24/2023   COVID-19 09/24/2023   Cough 06/28/2023   Degenerative tear of left medial meniscus 06/20/2023   History of skin cancer 05/20/2023   Melanocytic nevi of trunk 05/20/2023   Seborrheic keratoses 05/20/2023   Squamous cell carcinoma of skin of right lower extremity 05/20/2023   Mitral regurgitation 02/22/2023   Aneurysm of ascending aorta without rupture (HCC) 02/18/2023   Cellulitis of left leg 02/18/2023   Closed comminuted fracture of left humerus 02/07/2023   Bradycardia 02/07/2023   Fall 02/07/2023   Chronic a-fib (HCC) 02/07/2023   Statin intolerance 12/31/2022   Acute pain of right knee 12/06/2022   Impetigo  10/01/2022   Insomnia 10/01/2022   Osteoporosis 06/11/2022   Weakness 04/25/2022   Weakness generalized 04/25/2022   Falls frequently 04/19/2022   Iron deficiency anemia 12/08/2021   Skin ulcer, limited to breakdown of skin (HCC) 11/22/2021   Anemia, iron deficiency 11/22/2021   Low serum vitamin B12 11/22/2021   Acute renal failure superimposed on stage 2 chronic kidney disease (HCC) 11/20/2021   ABLA (acute blood loss anemia) 11/20/2021   Hypotension 11/19/2021   Shortness of breath 06/12/2021   Right leg pain 02/21/2021   Abnormal CT of the abdomen    Aortic atherosclerosis (HCC) 08/27/2020   Acute prerenal azotemia 08/27/2020   Shock circulatory (HCC) 08/27/2020   Metabolic acidosis with normal anion gap and bicarbonate losses 08/27/2020   Abdominal cramps 03/16/2020   Neurogenic orthostatic hypotension (HCC) 09/16/2019   Greater trochanteric pain syndrome of right lower extremity 07/30/2019   Abdominal pain 05/22/2018   Bradycardia with 41-50 beats per minute 12/27/2017   Right bundle branch block (RBBB) on electrocardiogram (ECG) 12/27/2017   Chronic kidney disease, stage 3b (HCC) 12/24/2017   Cellulitis of leg, right with large prepatella hematoma and open wounds 12/24/2017   Iliotibial band syndrome of right side 12/16/2017   Intervertebral lumbar disc disorder with myelopathy, lumbar region 04/25/2017   Family history of colon cancer in mother 01/22/2017   Primary osteoarthritis of both first carpometacarpal joints  01/02/2017   Pain 11/21/2016   Gastroenteritis 09/13/2016   Chronic anticoagulation 05/24/2015   Coronary artery disease involving native coronary artery of native heart with angina pectoris (HCC) 12/20/2014   Permanent atrial fibrillation (HCC) 11/21/2012   Greater trochanteric bursitis of both hips 05/21/2012   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   Bruising 12/18/2010   Osteoarthritis of left hip 07/03/2010   DEGENERATIVE DISC DISEASE, LUMBOSACRAL  SPINE 05/18/2010   SYNCOPE 10/27/2008   IBS 05/21/2008   Essential hypertension 06/16/2007   GERD (gastroesophageal reflux disease) 06/16/2007   Past Medical History:  Diagnosis Date   Acute blood loss anemia 12/25/2017   Allergic rhinitis    Anxiety    Arthritis    "my whole spine" (07/01/2017)   Arthritis    Atrial fibrillation (HCC)    Bowel obstruction (HCC)    in [place]   Bradycardia with 41-50 beats per minute 12/27/2017   Cancer (HCC)    Cellulitis of leg, right with large prepatella hematoma and open wounds 12/26/2017   CKD (chronic kidney disease) stage 2, GFR 60-89 ml/min    Colon polyps    Coronary artery disease    10/18 PCI/DES to p/m LCx with cutting balloon to mLcx   Diverticulosis of colon    GERD (gastroesophageal reflux disease)    Hip bursitis 2010   Dr Wyline Mood, Post op seroma   History of colon polyps    HSV (herpes simplex virus) anogenital infection 07/2019   HTN (hypertension)    IBS (irritable bowel syndrome)    constipation predominant - Dr Kinnie Scales   Lichen sclerosus    Osteopenia 11/2016   T score -2.0 FRAX 15%/4.3%   PAC (premature atrial contraction)    Symptomatiic   Renal insufficiency    Right bundle branch block (RBBB) on electrocardiogram (ECG) 12/27/2017   Scoliosis    SVT (supraventricular tachycardia) (HCC)    brief history   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   biopsy showing vulvar atypia, possible VIN I    Family History  Problem Relation Age of Onset   Colon cancer Mother        Dx age 7, died at age 44   Diabetes Father    Prostate cancer Father    Prostate cancer Brother    Pancreatic cancer Brother    Stomach cancer Son     Past Surgical History:  Procedure Laterality Date   ANTERIOR AND POSTERIOR VAGINAL REPAIR  01/2002   Hattie Perch 01/23/2011   APPENDECTOMY  1948   CARDIAC CATHETERIZATION  06/26/2017   CORONARY ANGIOPLASTY WITH STENT PLACEMENT  07/01/2017   CORONARY ATHERECTOMY N/A 07/01/2017    Procedure: CORONARY ATHERECTOMY;  Surgeon: Lyn Records, MD;  Location: [facility] INVASIVE CV [facility];  Service: Cardiovascular;  Laterality: N/A;   CORONARY STENT INTERVENTION N/A 07/01/2017   Procedure: CORONARY STENT INTERVENTION;  Surgeon: Lyn Records, MD;  Location: [facility];  Service: Cardiovascular;  Laterality: N/A;   HAMMER TOE SURGERY     HEMORRHOID BANDING     HIP SURGERY Left 04/2009   hip examination under anesthesia followed by greater trochanteric bursectomy; iliotibial band tenotomy/notes 01/20/2011   I & D EXTREMITY Right 01/10/2018   Procedure: IRRIGATION AND DEBRIDEMENT RIGHT KNEE, APPLY WOUND VAC;  Surgeon: Nadara Mustard, MD;  Location: [facility] OR;  Service: Orthopedics;  Laterality: Right;   KNEE BURSECTOMY Right 04/2009   Hattie Perch 01/09/2011   LEFT ATRIAL APPENDAGE OCCLUSION N/A 08/24/2021   Procedure: LEFT  ATRIAL APPENDAGE OCCLUSION;  Surgeon: Tonny Bollman, MD;  Location: [facility] INVASIVE CV LAB;  Service: Cardiovascular;  Laterality: N/A;   LEFT HEART CATH AND CORONARY ANGIOGRAPHY N/A 06/26/2017   Procedure: LEFT HEART CATH AND CORONARY ANGIOGRAPHY;  Surgeon: Lyn Records, MD;  Location: [facility];  Service: Cardiovascular;  Laterality: N/A;   PUBOVAGINAL SLING  01/2002   Hattie Perch 01/23/2011   REDUCTION MAMMAPLASTY     TEE WITHOUT CARDIOVERSION N/A 08/24/2021   Procedure: TRANSESOPHAGEAL ECHOCARDIOGRAM (TEE);  Surgeon: Tonny Bollman, MD;  Location: [facility] INVASIVE CV LAB;  Service: Cardiovascular;  Laterality: N/A;   TEMPORARY PACEMAKER N/A 07/01/2017   Procedure: TEMPORARY PACEMAKER;  Surgeon: Lyn Records, MD;  Location: [facility] INVASIVE CV LAB;  Service: Cardiovascular;  Laterality: N/A;   VAGINAL HYSTERECTOMY  01/2002   Vaginal hysterectomy, bilateral salpingo-oophorectomy/notes 01/23/2011   Social History   Occupational History   Occupation: Engineer, manufacturing systems: RETIRED  Tobacco Use   Smoking status: Never   Smokeless tobacco: Never  Vaping Use   Vaping  status: Never Used  Substance and Sexual Activity   Alcohol use: Never    Comment: 7 vodka drinks a week   Drug use: Never   Sexual activity: Not Currently    Birth control/protection: Surgical    Comment: hysterectomy; less than 5, IC after 18, no STD, no abnormal paps

## 2023-11-15 ENCOUNTER — Telehealth: Payer: Self-pay | Admitting: Physical Medicine and Rehabilitation

## 2023-11-15 ENCOUNTER — Telehealth: Payer: Self-pay

## 2023-11-15 NOTE — Telephone Encounter (Signed)
 Patient called requesting for a call back regarding a follow up on right leg with Dr. Alvester Morin. Hailey [name] [phone]

## 2023-11-15 NOTE — Telephone Encounter (Signed)
 Patient advised she got 80% relief/ improvement, However, she is advising that her Right leg is still hurting and asking what should she do next?

## 2023-11-18 ENCOUNTER — Ambulatory Visit (INDEPENDENT_AMBULATORY_CARE_PROVIDER_SITE_OTHER): Payer: Medicare Other | Admitting: Internal Medicine

## 2023-11-18 ENCOUNTER — Encounter: Payer: Self-pay | Admitting: Internal Medicine

## 2023-11-18 VITALS — BP 118/72 | HR 74 | Temp 98.0°F | Ht 64.0 in | Wt 141.0 lb

## 2023-11-18 DIAGNOSIS — N182 Chronic kidney disease, stage 2 (mild): Secondary | ICD-10-CM

## 2023-11-18 DIAGNOSIS — F419 Anxiety disorder, unspecified: Secondary | ICD-10-CM

## 2023-11-18 DIAGNOSIS — N179 Acute kidney failure, unspecified: Secondary | ICD-10-CM

## 2023-11-18 DIAGNOSIS — R55 Syncope and collapse: Secondary | ICD-10-CM | POA: Diagnosis not present

## 2023-11-18 DIAGNOSIS — I10 Essential (primary) hypertension: Secondary | ICD-10-CM | POA: Diagnosis not present

## 2023-11-18 DIAGNOSIS — A0472 Enterocolitis due to Clostridium difficile, not specified as recurrent: Secondary | ICD-10-CM | POA: Diagnosis not present

## 2023-11-18 DIAGNOSIS — E538 Deficiency of other specified B group vitamins: Secondary | ICD-10-CM | POA: Diagnosis not present

## 2023-11-18 MED ORDER — ESCITALOPRAM OXALATE 10 MG PO TABS: 10.0000 mg | ORAL_TABLET | Freq: Every day | ORAL | 11 refills | Status: DC

## 2023-11-18 NOTE — Assessment & Plan Note (Signed)
Continue with metoprolol and losartan.  Hydrate well 

## 2023-11-18 NOTE — Assessment & Plan Note (Signed)
 Increase Lexapro 10 mg at hs

## 2023-11-18 NOTE — Assessment & Plan Note (Addendum)
Hydrate well ?Monitor GFR ?

## 2023-11-18 NOTE — Assessment & Plan Note (Signed)
No relapse 

## 2023-11-18 NOTE — Assessment & Plan Note (Signed)
 On B12

## 2023-11-18 NOTE — Assessment & Plan Note (Signed)
 Resolved

## 2023-11-18 NOTE — Progress Notes (Signed)
 Subjective:  Patient ID: Hailey [name], female    DOB: 1935/05/02  Age: 89 y.o. MRN: [id number]  CC: No chief complaint on file.   HPI [name] presents for L shoulder pain, LBP - s/p injections S/p recent diarrhea, A fib  Outpatient Medications Prior to Visit  Medication Sig Dispense Refill   acetaminophen (TYLENOL) 500 MG tablet Take 1,000 mg by mouth every 6 (six) hours as needed (body aches / sore hip). Maximum of 6 tablets daily (3000mg )     albuterol (VENTOLIN HFA) 108 (90 Base) MCG/ACT inhaler Inhale 2 puffs into the lungs every 6 (six) hours as needed for wheezing or shortness of breath. 8 g 1   BIOTIN PO Take 5,000 mcg by mouth in the morning.     clobetasol ointment (TEMOVATE) 0.05 % Apply to the affected area in a thin layer twice a day for 2 weeks as needed for a flare.  Then apply to the area twice a week at bedtime as maintenance dosing. 60 g 1   cyclobenzaprine (FLEXERIL) 10 MG tablet Take 1 tablet (10 mg total) by mouth 3 (three) times daily as needed for muscle spasms. 30 tablet 0   diclofenac Sodium (VOLTAREN) 1 % GEL Apply 2 g topically 4 (four) times daily as needed (back pain.).     dicyclomine (BENTYL) 20 MG tablet Take 1 by mouth every 4-6 hours as needed for cramping 30 tablet 6   ELIQUIS 2.5 MG TABS tablet TAKE 1 TABLET BY MOUTH TWICE  DAILY 200 tablet 2   empagliflozin (JARDIANCE) 10 MG TABS tablet Take 1 tablet (10 mg total) by mouth daily before breakfast. 90 tablet 2   esomeprazole (NEXIUM) 40 MG capsule TAKE ONE CAPSULE ONCE DAILY 90 capsule 1   Homeopathic Products (ARNICARE) GEL Apply 1 application  topically daily as needed (skin bruising).     hyoscyamine (LEVSIN) 0.125 MG tablet TAKE 1-2 TABLETS EVERY 4 HOURS AS NEEDED FOR UP TO 10 DAYS FOR CRAMPING. 100 tablet 3   ipratropium (ATROVENT) 0.06 % nasal spray Place 2 sprays into the nose 4 (four) times daily. 15 mL 5   lidocaine (XYLOCAINE) 5 % ointment Apply 1 Application topically 3 (three) times  daily. Uses as needed three times a day for pain of the vulva. 1.25 g 1   LORazepam (ATIVAN) 1 MG tablet TAKE ONE TABLET BY MOUTH TWICE DAILY AS NEEDED FOR ANXIETY / SLEEP. 180 tablet 1   losartan (COZAAR) 25 MG tablet Take 1 tablet (25 mg total) by mouth daily. 90 tablet 3   nitroGLYCERIN (NITROSTAT) 0.4 MG SL tablet Place 1 tablet (0.4 mg total) under the tongue every 5 (five) minutes as needed for chest pain (Call 911 at 3rd dose within 15 minutes.). 20 tablet 3   ondansetron (ZOFRAN-ODT) 8 MG disintegrating tablet dissolve 1 tablet (8 mg total) in mouth every 8 (eight) hours as needed for nausea or vomiting. 12 tablet 3   Polyethyl Glycol-Propyl Glycol (LUBRICANT EYE DROPS) 0.4-0.3 % SOLN Place 1-2 drops into both eyes at bedtime.     polyethylene glycol (MIRALAX / GLYCOLAX) 17 g packet Take 17 g by mouth as needed for mild constipation or moderate constipation.     sodium chloride (OCEAN) 0.65 % SOLN nasal spray Place 1 spray into both nostrils at bedtime as needed for congestion.     escitalopram (LEXAPRO) 5 MG tablet Take 1 tablet (5 mg total) by mouth every evening. 30 tablet 5   No facility-administered  medications prior to visit.    ROS: Review of Systems  Constitutional:  Negative for activity change, appetite change, chills, fatigue and unexpected weight change.  HENT:  Negative for congestion, mouth sores and sinus pressure.   Eyes:  Negative for visual disturbance.  Respiratory:  Negative for cough and chest tightness.   Gastrointestinal:  Negative for abdominal pain and nausea.  Genitourinary:  Negative for difficulty urinating, frequency and vaginal pain.  Musculoskeletal:  Positive for arthralgias, back pain and gait problem.  Skin:  Negative for pallor and rash.  Neurological:  Negative for dizziness, tremors, weakness, numbness and headaches.  Hematological:  Bruises/bleeds easily.  Psychiatric/Behavioral:  Negative for confusion, decreased concentration, sleep disturbance  and suicidal ideas.     Objective:  BP 118/72   Pulse 74   Temp 98 F (36.7 C) (Oral)   Ht 5\' 4"  (1.626 m)   Wt 141 lb (64 kg)   SpO2 93%   BMI 24.20 kg/m   BP Readings from Last 3 Encounters:  11/18/23 118/72  11/04/23 (!) 144/86  10/22/23 130/88    Wt Readings from Last 3 Encounters:  11/18/23 141 lb (64 kg)  10/22/23 143 lb (64.9 kg)  10/11/23 141 lb (64 kg)    Physical Exam Constitutional:      General: She is not in acute distress.    Appearance: Normal appearance. She is well-developed.  HENT:     Head: Normocephalic.     Right Ear: External ear normal.     Left Ear: External ear normal.     Nose: Nose normal.  Eyes:     General:        Right eye: No discharge.        Left eye: No discharge.     Conjunctiva/sclera: Conjunctivae normal.     Pupils: Pupils are equal, round, and reactive to light.  Neck:     Thyroid: No thyromegaly.     Vascular: No JVD.     Trachea: No tracheal deviation.  Cardiovascular:     Rate and Rhythm: Normal rate and regular rhythm.     Heart sounds: Normal heart sounds.  Pulmonary:     Effort: No respiratory distress.     Breath sounds: No stridor. No wheezing.  Abdominal:     General: Bowel sounds are normal. There is no distension.     Palpations: Abdomen is soft. There is no mass.     Tenderness: There is no abdominal tenderness. There is no guarding or rebound.  Musculoskeletal:        General: Tenderness present.     Cervical back: Normal range of motion and neck supple. No rigidity.     Right lower leg: No edema.     Left lower leg: No edema.  Lymphadenopathy:     Cervical: No cervical adenopathy.  Skin:    Findings: Bruising present. No erythema or rash.  Neurological:     Cranial Nerves: No cranial nerve deficit.     Motor: No abnormal muscle tone.     Coordination: Coordination normal.     Gait: Gait abnormal.     Deep Tendon Reflexes: Reflexes normal.  Psychiatric:        Behavior: Behavior normal.         Thought Content: Thought content normal.        Judgment: Judgment normal.   L shoulder LS w/pain Antalgic gait   Lab Results  Component Value Date   WBC 5.5 10/11/2023  HGB 13.8 10/11/2023   HCT 41.5 10/11/2023   PLT 219.0 10/11/2023   GLUCOSE 79 10/11/2023   CHOL 145 02/18/2023   TRIG 125.0 02/18/2023   HDL 63.60 02/18/2023   LDLDIRECT 89.1 06/29/2013   LDLCALC 57 02/18/2023   ALT 9 10/11/2023   AST 19 10/11/2023   NA 134 (L) 10/11/2023   K 4.5 10/11/2023   CL 98 10/11/2023   CREATININE 1.09 10/11/2023   BUN 13 10/11/2023   CO2 27 10/11/2023   TSH 1.685 02/07/2023   INR 1.2 02/07/2023   HGBA1C 5.5 02/08/2023    CT ABDOMEN PELVIS W CONTRAST Result Date: 10/11/2023 CLINICAL DATA:  Abdominal pain, acute, nonlocalized EXAM: CT ABDOMEN AND PELVIS WITH CONTRAST TECHNIQUE: Multidetector CT imaging of the abdomen and pelvis was performed using the standard protocol following bolus administration of intravenous contrast. RADIATION DOSE REDUCTION: This exam was performed according to the departmental dose-optimization program which includes automated exposure control, adjustment of the mA and/or kV according to patient size and/or use of iterative reconstruction technique. CONTRAST:  OMNIPAQUE IOHEXOL 300 MG/ML  SOLN COMPARISON:  05/16/2023 FINDINGS: Lower chest: Stable small subpleural nodule at the right lung base which does not require follow-up imaging (series 4, image 8). Minimal atelectatic changes. Hepatobiliary: Stable cyst in the inferior right hepatic lobe. Subcentimeter low-density lesion posteriorly in the right hepatic lobe remains too small to characterize. No new liver abnormality. Unremarkable gallbladder. No hyperdense gallstone. No biliary dilatation. Pancreas: Pancreatic atrophy. No inflammatory changes or ductal dilatation. Spleen: Normal in size without focal abnormality. Adrenals/Urinary Tract: Adrenal glands are unremarkable. Kidneys are normal, without renal  calculi, solid lesion, or hydronephrosis. Stable upper pole left renal cyst which does not require follow-up imaging. Bladder is unremarkable. Stomach/Bowel: Stomach within normal limits. No abnormally dilated loops of bowel. Extensive left-sided colonic diverticulosis. No focally inflamed diverticulum identified. Long segment wall thickening of the sigmoid colon and distal descending colon similar to prior and likely reflecting sequela of chronic diverticulitis/muscular hypertrophy. No pericolonic fat stranding or fluid. Vascular/Lymphatic: Aortic atherosclerosis. No enlarged abdominal or pelvic lymph nodes. Reproductive: Status post hysterectomy. No adnexal masses. Other: No free fluid. No abdominopelvic fluid collection. No pneumoperitoneum. No abdominal wall hernia. Musculoskeletal: Degenerative changes of the lumbar spine. No acute bony abnormality. IMPRESSION: 1. No acute abdominopelvic findings. 2. Extensive left-sided colonic diverticulosis. No focally inflamed diverticulum identified. Long segment wall thickening of the sigmoid colon and distal descending colon similar to prior and likely reflecting sequela of chronic diverticulitis/muscular hypertrophy. 3. Aortic atherosclerosis (ICD10-I70.0). Electronically Signed   By: Duanne Guess D.O.   On: 10/11/2023 18:11    Assessment & Plan:   Problem List Items Addressed This Visit     Anxiety   Increase Lexapro 10 mg at hs      Relevant Medications   escitalopram (LEXAPRO) 10 MG tablet   Essential hypertension - Primary   Continue with metoprolol and losartan.  Hydrate well      SYNCOPE   No relapse      C. difficile colitis   Resolved      Acute renal failure superimposed on stage 2 chronic kidney disease (HCC)   Hydrate well Monitor GFR      Low serum vitamin B12   On B12         Meds ordered this encounter  Medications   escitalopram (LEXAPRO) 10 MG tablet    Sig: Take 1 tablet (10 mg total) by mouth daily.     Dispense:  30 tablet    Refill:  11      Follow-up: Return in about 3 months (around 02/18/2024) for a follow-up visit.  Sonda Primes, MD

## 2023-11-20 ENCOUNTER — Telehealth: Payer: Self-pay

## 2023-11-20 NOTE — Telephone Encounter (Signed)
 Follow up post injection-- Pt is still having right hip pain and down leg to knee. The injection was on 2/24 and it has helped her back a lot, She wanted to know if theres anything that can be done to help Rt hip.

## 2023-11-22 ENCOUNTER — Ambulatory Visit (INDEPENDENT_AMBULATORY_CARE_PROVIDER_SITE_OTHER): Payer: Medicare Other

## 2023-11-22 VITALS — Ht 64.0 in | Wt 141.0 lb

## 2023-11-22 DIAGNOSIS — Z0000 Encounter for general adult medical examination without abnormal findings: Secondary | ICD-10-CM

## 2023-11-22 NOTE — Patient Instructions (Signed)
 Hailey [name] , Thank you for taking time to come for your Medicare Wellness Visit. I appreciate your ongoing commitment to your health goals. Please review the following plan we discussed and let me know if I can assist you in the future.   Referrals/Orders/Follow-Ups/Clinician Recommendations: It was nice to speak with you today.  Keep up the good work and have a Ship broker.  This is a list of the screening recommended for you and due dates:  Health Maintenance  Topic Date Due   COVID-19 Vaccine (5 - 2024-25 season) 05/12/2023   Medicare Annual Wellness Visit  11/21/2024   DTaP/Tdap/Td vaccine (4 - Td or Tdap) 12/23/2027   Pneumonia Vaccine  Completed   Flu Shot  Completed   DEXA scan (bone density measurement)  Completed   HPV Vaccine  Aged Out   Zoster (Shingles) Vaccine  Discontinued    Advanced directives: (In Chart) A copy of your advanced directives are scanned into your chart should your provider ever need it.  Next Medicare Annual Wellness Visit scheduled for next year: Yes

## 2023-11-22 NOTE — Progress Notes (Signed)
 Subjective:   Hailey [name] is a 89 y.o. who presents for a Medicare Wellness preventive visit.  Visit Complete: Virtual I connected with  Hailey [name] on 11/22/23 by a audio enabled telemedicine application and verified that I am speaking with the correct person using two identifiers.  Patient Location: Home  Provider Location: Home Office  I discussed the limitations of evaluation and management by telemedicine. The patient expressed understanding and agreed to proceed.  Vital Signs: Because this visit was a virtual/telehealth visit, some criteria may be missing or patient reported. Any vitals not documented were not able to be obtained and vitals that have been documented are patient reported.  VideoDeclined- This patient declined Librarian, academic. Therefore the visit was completed with audio only.  Persons Participating in Visit: Patient.  AWV Questionnaire: No: Patient Medicare AWV questionnaire was not completed prior to this visit.  Cardiac Risk Factors include: advanced age (>55men, >72 women);Other (see comment), Risk factor comments: A-Fib, CKD stage b ,Aortic atherosclerosis, CAD     Objective:    Today's Vitals   11/22/23 1039  Weight: 141 lb (64 kg)  Height: 5\' 4"  (1.626 m)  PainSc: 5    Body mass index is 24.2 kg/m.     11/22/2023   11:38 AM 02/26/2023    2:04 PM 02/07/2023   11:38 AM 05/17/2022   10:01 AM 04/24/2022    9:00 PM 02/14/2022   10:34 AM 11/19/2021    8:58 PM  Advanced Directives  Does Patient Have a Medical Advance Directive? Yes Yes Unable to assess, patient is non-responsive or altered mental status No No Yes Yes  Type of Estate agent of [place];Living will Healthcare Power of [place];Living will    Healthcare Power of [employer] of [place];Living will  Does patient want to make changes to medical advance directive? No - Patient declined No - Patient declined       Copy of  Healthcare Power of Attorney in Chart? Yes - validated most recent copy scanned in chart (See row information) Yes - validated most recent copy scanned in chart (See row information)    No - copy requested     Current Medications (verified) Outpatient Encounter Medications as of 11/22/2023  Medication Sig   acetaminophen (TYLENOL) 500 MG tablet Take 1,000 mg by mouth every 6 (six) hours as needed (body aches / sore hip). Maximum of 6 tablets daily (3000mg )   BIOTIN PO Take 5,000 mcg by mouth in the morning.   clobetasol ointment (TEMOVATE) 0.05 % Apply to the affected area in a thin layer twice a day for 2 weeks as needed for a flare.  Then apply to the area twice a week at bedtime as maintenance dosing.   cyclobenzaprine (FLEXERIL) 10 MG tablet Take 1 tablet (10 mg total) by mouth 3 (three) times daily as needed for muscle spasms.   diclofenac Sodium (VOLTAREN) 1 % GEL Apply 2 g topically 4 (four) times daily as needed (back pain.).   dicyclomine (BENTYL) 20 MG tablet Take 1 by mouth every 4-6 hours as needed for cramping   ELIQUIS 2.5 MG TABS tablet TAKE 1 TABLET BY MOUTH TWICE  DAILY   empagliflozin (JARDIANCE) 10 MG TABS tablet Take 1 tablet (10 mg total) by mouth daily before breakfast.   escitalopram (LEXAPRO) 10 MG tablet Take 1 tablet (10 mg total) by mouth daily.   esomeprazole (NEXIUM) 40 MG capsule TAKE ONE CAPSULE ONCE DAILY  Homeopathic Products (ARNICARE) GEL Apply 1 application  topically daily as needed (skin bruising).   hyoscyamine (LEVSIN) 0.125 MG tablet TAKE 1-2 TABLETS EVERY 4 HOURS AS NEEDED FOR UP TO 10 DAYS FOR CRAMPING.   ipratropium (ATROVENT) 0.06 % nasal spray Place 2 sprays into the nose 4 (four) times daily.   lidocaine (XYLOCAINE) 5 % ointment Apply 1 Application topically 3 (three) times daily. Uses as needed three times a day for pain of the vulva.   LORazepam (ATIVAN) 1 MG tablet TAKE ONE TABLET BY MOUTH TWICE DAILY AS NEEDED FOR ANXIETY / SLEEP.   losartan  (COZAAR) 25 MG tablet Take 1 tablet (25 mg total) by mouth daily.   nitroGLYCERIN (NITROSTAT) 0.4 MG SL tablet Place 1 tablet (0.4 mg total) under the tongue every 5 (five) minutes as needed for chest pain (Call 911 at 3rd dose within 15 minutes.).   Polyethyl Glycol-Propyl Glycol (LUBRICANT EYE DROPS) 0.4-0.3 % SOLN Place 1-2 drops into both eyes at bedtime.   polyethylene glycol (MIRALAX / GLYCOLAX) 17 g packet Take 17 g by mouth as needed for mild constipation or moderate constipation.   sodium chloride (OCEAN) 0.65 % SOLN nasal spray Place 1 spray into both nostrils at bedtime as needed for congestion.   albuterol (VENTOLIN HFA) 108 (90 Base) MCG/ACT inhaler Inhale 2 puffs into the lungs every 6 (six) hours as needed for wheezing or shortness of breath.   ondansetron (ZOFRAN-ODT) 8 MG disintegrating tablet dissolve 1 tablet (8 mg total) in mouth every 8 (eight) hours as needed for nausea or vomiting.   No facility-administered encounter medications on file as of 11/22/2023.    Allergies (verified) Macrobid [nitrofurantoin monohyd macro], Meloxicam, Digoxin and related, Diprolene [betamethasone dipropionate aug], Ferrous sulfate, Keflex [cephalexin], Lipitor [atorvastatin], Mobic [meloxicam], and Sulfamethoxazole-trimethoprim   History: Past Medical History:  Diagnosis Date   Acute blood loss anemia 12/25/2017   Allergic rhinitis    Anxiety    Arthritis    "my whole spine" (07/01/2017)   Arthritis    Atrial fibrillation (HCC)    Bowel obstruction (HCC)    in [place]   Bradycardia with 41-50 beats per minute 12/27/2017   Cancer (HCC)    Cellulitis of leg, right with large prepatella hematoma and open wounds 12/26/2017   CKD (chronic kidney disease) stage 2, GFR 60-89 ml/min    Colon polyps    Coronary artery disease    10/18 PCI/DES to p/m LCx with cutting balloon to mLcx   Diverticulosis of colon    GERD (gastroesophageal reflux disease)    Hip bursitis 2010   Dr Wyline Mood, Post op  seroma   History of colon polyps    HSV (herpes simplex virus) anogenital infection 07/2019   HTN (hypertension)    IBS (irritable bowel syndrome)    constipation predominant - Dr Kinnie Scales   Lichen sclerosus    Osteopenia 11/2016   T score -2.0 FRAX 15%/4.3%   PAC (premature atrial contraction)    Symptomatiic   Renal insufficiency    Right bundle branch block (RBBB) on electrocardiogram (ECG) 12/27/2017   Scoliosis    SVT (supraventricular tachycardia) (HCC)    brief history   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   biopsy showing vulvar atypia, possible VIN I   Past Surgical History:  Procedure Laterality Date   ANTERIOR AND POSTERIOR VAGINAL REPAIR  01/2002   Hattie Perch 01/23/2011   APPENDECTOMY  1948   CARDIAC CATHETERIZATION  06/26/2017   CORONARY ANGIOPLASTY WITH  STENT PLACEMENT  07/01/2017   CORONARY ATHERECTOMY N/A 07/01/2017   Procedure: CORONARY ATHERECTOMY;  Surgeon: Lyn Records, MD;  Location: [facility] INVASIVE CV LAB;  Service: Cardiovascular;  Laterality: N/A;   CORONARY STENT INTERVENTION N/A 07/01/2017   Procedure: CORONARY STENT INTERVENTION;  Surgeon: Lyn Records, MD;  Location: [facility] INVASIVE CV LAB;  Service: Cardiovascular;  Laterality: N/A;   HAMMER TOE SURGERY     HEMORRHOID BANDING     HIP SURGERY Left 04/2009   hip examination under anesthesia followed by greater trochanteric bursectomy; iliotibial band tenotomy/notes 01/20/2011   I & D EXTREMITY Right 01/10/2018   Procedure: IRRIGATION AND DEBRIDEMENT RIGHT KNEE, APPLY WOUND VAC;  Surgeon: Nadara Mustard, MD;  Location: [facility] OR;  Service: Orthopedics;  Laterality: Right;   KNEE BURSECTOMY Right 04/2009   Hattie Perch 01/09/2011   LEFT ATRIAL APPENDAGE OCCLUSION N/A 08/24/2021   Procedure: LEFT ATRIAL APPENDAGE OCCLUSION;  Surgeon: Tonny Bollman, MD;  Location[facility] INVASIVE CV LAB;  Service: Cardiovascular;  Laterality: N/A;   LEFT HEART CATH AND CORONARY ANGIOGRAPHY N/A 06/26/2017   Procedure: LEFT HEART CATH  AND CORONARY ANGIOGRAPHY;  Surgeon: Lyn Records, MD;  Location: [facility] INVASIVE CV LAB;  Service: Cardiovascular;  Laterality: N/A;   PUBOVAGINAL SLING  01/2002   Hattie Perch 01/23/2011   REDUCTION MAMMAPLASTY     TEE WITHOUT CARDIOVERSION N/A 08/24/2021   Procedure: TRANSESOPHAGEAL ECHOCARDIOGRAM (TEE);  Surgeon: Tonny Bollman, MD;  Location: [facility] INVASIVE CV LAB;  Service: Cardiovascular;  Laterality: N/A;   TEMPORARY PACEMAKER N/A 07/01/2017   Procedure: TEMPORARY PACEMAKER;  Surgeon: Lyn Records, MD;  Location: [facility] INVASIVE CV LAB;  Service: Cardiovascular;  Laterality: N/A;   VAGINAL HYSTERECTOMY  01/2002   Vaginal hysterectomy, bilateral salpingo-oophorectomy/notes 01/23/2011   Family History  Problem Relation Age of Onset   Colon cancer Mother        Dx age 52, died at age 32   Diabetes Father    Prostate cancer Father    Prostate cancer Brother    Pancreatic cancer Brother    Stomach cancer Son    Social History   Socioeconomic History   Marital status: Married    Spouse name: [name]   Number of children: 2   Years of education: Not on file   Highest education level: Bachelor's degree (e.g., BA, AB, BS)  Occupational History   Occupation: Engineer, manufacturing systems: RETIRED  Tobacco Use   Smoking status: Never   Smokeless tobacco: Never  Vaping Use   Vaping status: Never Used  Substance and Sexual Activity   Alcohol use: Yes    Comment: moderatley [place] before dinner   Drug use: Never   Sexual activity: Not Currently    Birth control/protection: Surgical    Comment: hysterectomy; less than 5, IC after 16, no STD, no abnormal paps  Other Topics Concern   Not on file  Social History Narrative   ** Merged History Encounter ** Regular Exercise -  YES      Lives at home with husband/2025   Social Drivers of Health   Financial Resource Strain: Low Risk  (11/22/2023)   Overall Financial Resource Strain (CARDIA)    Difficulty of Paying Living Expenses: Not hard at all   Food Insecurity: No Food Insecurity (11/22/2023)   Hunger Vital Sign    Worried About Running Out of Food in the Last Year: Never true    Ran Out of Food in the Last Year: Never true  Transportation Needs: No Transportation Needs (11/22/2023)   PRAPARE - Administrator, Civil Service (Medical): No    Lack of Transportation (Non-Medical): No  Physical Activity: Insufficiently Active (11/22/2023)   Exercise Vital Sign    Days of Exercise per Week: 2 days    Minutes of Exercise per Session: 60 min  Stress: No Stress Concern Present (11/22/2023)   [employer] of Occupational Health - Occupational Stress Questionnaire    Feeling of Stress : Only a little  Social Connections: Moderately Isolated (11/22/2023)   Social Connection and Isolation Panel [NHANES]    Frequency of Communication with Friends and Family: More than three times a week    Frequency of Social Gatherings with Friends and Family: Three times a week    Attends Religious Services: Never    Active Member of Clubs or Organizations: No    Attends Banker Meetings: Never    Marital Status: Married    Tobacco Counseling Counseling given: Not Answered    Clinical Intake:  Pre-visit preparation completed: Yes  Pain : 0-10 Pain Score: 5  Pain Type: Chronic pain Pain Location: Hip (and leg) Pain Orientation: Right Pain Descriptors / Indicators: Aching, Discomfort Pain Onset: More than a month ago Pain Frequency: Constant Pain Relieving Factors: Tylenol x 3 a day.  Pain Relieving Factors: Tylenol x 3 a day.  BMI - recorded: 24.2 Nutritional Status: BMI 25 -29 Overweight Nutritional Risks: None  How often do you need to have someone help you when you read instructions, pamphlets, or other written materials from your doctor or pharmacy?: 1 - Never  Interpreter Needed?: No  Information entered by :: Maron Stanzione, RMA   Activities of Daily Living     11/22/2023   10:40 AM 02/26/2023     2:08 PM  In your present state of health, do you have any difficulty performing the following activities:  Hearing? 1 1  Comment wears hearing aides   Vision? 0 0  Difficulty concentrating or making decisions? 0 0  Walking or climbing stairs? 0 0  Dressing or bathing? 0 1  Doing errands, shopping? 0 1  Preparing Food and eating ? N N  Using the Toilet? N N  In the past six months, have you accidently leaked urine? Y Y  Do you have problems with loss of bowel control? N N  Managing your Medications? N N  Managing your Finances? N N  Housekeeping or managing your Housekeeping? N Y    Patient Care Team: Tresa Garter, MD as PCP - General (Internal Medicine) Lanier Prude, MD as PCP - Electrophysiology (Cardiology) Orbie Pyo, MD as PCP - Cardiology (Cardiology) Hilarie Fredrickson, MD as Consulting Physician (Gastroenterology) Mia Creek, MD as Consulting Physician (Ophthalmology) Szabat, Vinnie Level, [facility] (Inactive) as Pharmacist (Pharmacist) Johney Maine, MD as Consulting Physician (Hematology) Plotnikov, Georgina Quint, MD (Internal Medicine) Orbie Pyo, MD (Cardiology)  Indicate any recent Medical Services you may have received from other than [facility] providers in the past year (date may be approximate).     Assessment:   This is a routine wellness examination for Hailey [name].  Hearing/Vision screen Hearing Screening - Comments:: wears hearing aides Vision Screening - Comments:: Wears eyeglasses   Goals Addressed             This Visit's Progress    Increase physical activity   On track      Depression Screen     11/22/2023  11:44 AM 10/22/2023    2:05 PM 10/11/2023   10:42 AM 09/24/2023   10:37 AM 06/28/2023    8:29 AM 05/20/2023    2:12 PM 02/28/2023    2:26 PM  PHQ 2/9 Scores  PHQ - 2 Score 0 1 3 0 0 0 1  PHQ- 9 Score 2 4 6  1  0     Fall Risk     11/22/2023   11:39 AM 10/22/2023    2:04 PM 09/24/2023   10:37 AM 06/28/2023    8:28 AM  05/20/2023    2:12 PM  Fall Risk   Falls in the past year? 1 0 1 1 0  Number falls in past yr: 0 0 0 0 0  Injury with Fall? 1 0 1 1 0  Comment broke her shoulder      Risk for fall due to :  No Fall Risks History of fall(s) History of fall(s) No Fall Risks  Follow up Falls evaluation completed;Falls prevention discussed Falls evaluation completed Falls evaluation completed Falls evaluation completed Falls evaluation completed    MEDICARE RISK AT HOME:  Medicare Risk at Home Any stairs in or around the home?: Yes If so, are there any without handrails?: Yes Home free of loose throw rugs in walkways, pet beds, electrical cords, etc?: Yes Adequate lighting in your home to reduce risk of falls?: Yes Life alert?: Yes (Has a Apple watch) Use of a cane, walker or w/c?: No Grab bars in the bathroom?: Yes Shower chair or bench in shower?: Yes Elevated toilet seat or a handicapped toilet?: Yes  TIMED UP AND GO:  Was the test performed?  No  Cognitive Function: 6CIT completed    10/10/2015    2:32 PM  MMSE - Mini Mental State Exam  Not completed: --        11/22/2023   10:43 AM 02/26/2023    2:06 PM 02/14/2022   10:33 AM  6CIT Screen  What Year? 0 points 0 points 0 points  What month? 0 points 0 points 0 points  What time? 0 points 0 points 0 points  Count back from 20 0 points 0 points 0 points  Months in reverse 0 points 0 points 0 points  Repeat phrase 0 points 0 points 0 points  Total Score 0 points 0 points 0 points    Immunizations Immunization History  Administered Date(s) Administered   Influenza Split 06/18/2012   Influenza Whole 05/26/2008, 06/05/2010   Influenza, High Dose Seasonal PF 06/03/2014, 06/12/2017, 05/22/2018, 05/14/2019, 06/07/2020, 06/09/2022   Influenza,inj,Quad PF,6+ Mos 06/09/2015   Influenza-Unspecified 06/08/2013, 07/01/2023   PFIZER Comirnaty(Gray Top)Covid-19 Tri-Sucrose Vaccine 02/21/2021   PFIZER(Purple Top)SARS-COV-2 Vaccination 09/24/2019,  10/15/2019, 06/07/2020   Pneumococcal Conjugate-13 01/25/2014   Pneumococcal Polysaccharide-23 11/07/2018   Td 06/11/2007, 06/17/2007   Tdap 12/22/2017   Zoster, Live 12/01/2008    Screening Tests Health Maintenance  Topic Date Due   COVID-19 Vaccine (5 - 2024-25 season) 05/12/2023   Medicare Annual Wellness (AWV)  11/21/2024   DTaP/Tdap/Td (4 - Td or Tdap) 12/23/2027   Pneumonia Vaccine 63+ Years old  Completed   INFLUENZA VACCINE  Completed   DEXA SCAN  Completed   HPV VACCINES  Aged Out   Zoster Vaccines- Shingrix  Discontinued    Health Maintenance  Health Maintenance Due  Topic Date Due   COVID-19 Vaccine (5 - 2024-25 season) 05/12/2023   Health Maintenance Items Addressed: See Nurse Notes  Additional Screening:  Vision Screening: Recommended annual  ophthalmology exams for early detection of glaucoma and other disorders of the eye.  Dental Screening: Recommended annual dental exams for proper oral hygiene  Community Resource Referral / Chronic Care Management: CRR required this visit?  No   CCM required this visit?  No     Plan:     I have personally reviewed and noted the following in the patient's chart:   Medical and social history Use of alcohol, tobacco or illicit drugs  Current medications and supplements including opioid prescriptions. Patient is not currently taking opioid prescriptions. Functional ability and status Nutritional status Physical activity Advanced directives List of other physicians Hospitalizations, surgeries, and ER visits in previous 12 months Vitals Screenings to include cognitive, depression, and falls Referrals and appointments  In addition, I have reviewed and discussed with patient certain preventive protocols, quality metrics, and best practice recommendations. A written personalized care plan for preventive services as well as general preventive health recommendations were provided to patient.     Jamarian Jacinto L [place],  CMA   11/22/2023   After Visit Summary: (MyChart) Due to this being a telephonic visit, the after visit summary with patients personalized plan was offered to patient via MyChart   Notes: Nothing significant to report at this time.

## 2023-12-02 ENCOUNTER — Ambulatory Visit: Payer: Medicare Other | Admitting: Internal Medicine

## 2023-12-03 ENCOUNTER — Ambulatory Visit: Admitting: Physical Medicine and Rehabilitation

## 2023-12-03 ENCOUNTER — Telehealth: Payer: Self-pay | Admitting: Physical Medicine and Rehabilitation

## 2023-12-03 ENCOUNTER — Other Ambulatory Visit: Payer: Self-pay | Admitting: Internal Medicine

## 2023-12-03 NOTE — Telephone Encounter (Signed)
 Patient called and said she isn't feeling good and wants to reschedule. CB#[phone]

## 2023-12-05 ENCOUNTER — Encounter: Payer: Self-pay | Admitting: Physical Medicine and Rehabilitation

## 2023-12-05 ENCOUNTER — Ambulatory Visit: Admitting: Physical Medicine and Rehabilitation

## 2023-12-05 DIAGNOSIS — M7061 Trochanteric bursitis, right hip: Secondary | ICD-10-CM | POA: Diagnosis not present

## 2023-12-05 DIAGNOSIS — M25551 Pain in right hip: Secondary | ICD-10-CM | POA: Diagnosis not present

## 2023-12-05 NOTE — Progress Notes (Unsigned)
 Pain Scale   Average Pain 8 Patient advising he follow up from lower back injection is much better. However, she is advising that she is having severe pain in right hip area.        +Driver, -BT, -Dye Allergies.

## 2023-12-05 NOTE — Progress Notes (Unsigned)
 Hailey [name] - 89 y.o. female MRN [id number]  Date of birth: 1934-12-29  Office Visit Note: Visit Date: 12/05/2023 PCP: Tresa Garter, MD Referred by: Tresa Garter, MD  Subjective: Chief Complaint  Patient presents with   Right Hip - Pain   HPI: Hailey [name] is a 89 y.o. female who comes in today for evaluation of chronic, worsening and severe right sided hip pain radiating down lateral thigh to knee. She denies lower back pain at this time. Pain ongoing for several years and worsens with prolonged sitting and activity, she describes pain as sore and aching, currently rates as 8 out of 10. Also reports difficulty sleeping and laying on right side. Some relief of pain with home exercise regimen, rest and use of medications. Patient is currently working with a Event organiser several times a week to improve balance, posture and strength. Patient has completed regimen of formal physical therapy with in the past for more balance/gait issues. Lumbar MRI imaging from 2023 shows scoliotic thoracolumbar curvature, multi-level facet arthropathy, mild-moderate canal stenosis with moderate right foraminal stenosis at L3-L4 and mild canal stenosis and moderate bilateral foraminal stenosis at L2-L3. No high grade spinal canal stenosis noted. Patient has undergone multiple lumbar epidural steroid injections in our office with short term relief of pain. Most recent was bilateral L4 transforaminal epidural steroid injection on 11/04/2023. She reports no pain relief with this procedure. Patient also underwent right greater trochanter injection with Dr. Darene Lamer at [facility]. She denies pain relief with this procedure. Patient denies focal weakness, numbness and tingling. No recent trauma or falls.      Review of Systems  Musculoskeletal:  Positive for joint pain and myalgias.  Neurological:  Negative for tingling, sensory change, focal weakness and weakness.  All other  systems reviewed and are negative.  Otherwise per HPI.  Assessment & Plan: Visit Diagnoses:    ICD-10-CM   1. Pain in right hip  M25.551     2. Greater trochanteric bursitis, right  M70.61 Ambulatory referral to Physical Medicine Rehab       Plan: Findings:  Chronic, worsening and severe right sided hip pain radiating down lateral thigh to knee. Patient continues to have severe pain despite good conservative therapies such as home exercise regimen, rest and use of medications. Patients clinical presentation and exam are consistent with greater trochanteric bursitis. She does have tenderness over right greater trochanter upon palpation today. At this point, lumbar injections did not seem to be providing her with lasting relief of pain. I would not recommend repeating lumbar injections at this time. We discussed treatment plan in detail. Next step is to perform diagnostic and hopefully therapeutic right greater trochanter injection under fluoroscopic guidance. If good relief of pain with this injection can repeat infrequently as needed. Could also look at obtaining new lumbar MRI imaging at some point. Patient has no questions at this time and is agreeable with plan. No red flag symptoms noted upon exam today.     Meds & Orders: No orders of the defined types were placed in this encounter.   Orders Placed This Encounter  Procedures   Ambulatory referral to Physical Medicine Rehab    Follow-up: Return for Right greater trochanter injection.   Procedures: No procedures performed      Clinical History: EXAM: MRI LUMBAR SPINE WITHOUT CONTRAST   TECHNIQUE: Multiplanar, multisequence MR imaging of the lumbar spine was performed. No intravenous contrast was administered.  COMPARISON:  MR lumbar 03/20/2017; X-ray lumbar 09/14/2021.   FINDINGS: Segmentation:  Standard.   Alignment: Scoliotic thoracolumbar curvature. Mild retrolisthesis at L1-2, L2-3, and L3-4.   Vertebrae: No  fracture, evidence of discitis, or suspicious bone lesion. Scattered intraosseous hemangiomas.   Conus medullaris and cauda equina: Conus extends to the L1 level. Conus and cauda equina appear normal.   Paraspinal and other soft tissues: No acute findings.   Disc levels:   T12-L1: Mild disc bulge. No foraminal or canal stenosis. Unchanged.   L1-L2: Disc height loss with mild disc bulge and endplate ridging. Mild left foraminal stenosis. No canal stenosis. Unchanged.   L2-L3: Retrolisthesis. Diffuse disc bulge with small caudally extending right paracentral disc extrusion. Mild bilateral facet arthropathy. Mild canal stenosis with moderate bilateral foraminal stenosis. Findings have progressed from prior.   L3-L4: Diffuse disc bulge with mild bilateral facet arthropathy and ligamentum flavum buckling. Findings result in mild-moderate canal stenosis with moderate right foraminal stenosis. Findings progressed from prior.   L4-L5: Disc bulge with endplate osteophytic ridging. Advanced bilateral facet arthropathy. Severe right and mild left foraminal stenosis. No significant interval progression.   L5-S1: Disc height loss and endplate ridging. Mild facet hypertrophy. No canal stenosis. Mild-to-moderate left and mild right foraminal stenosis. Unchanged.   IMPRESSION: 1. Multilevel lumbar spondylosis, slightly progressed from prior MRI. 2. Mild-moderate canal stenosis with moderate right foraminal stenosis at L3-4. 3. Mild canal stenosis and moderate bilateral foraminal stenosis at L2-3. 4. Severe right and mild left foraminal stenosis at L4-5.     Electronically Signed   By: Duanne Guess D.O.   On: 11/29/2021 13:23   She reports that she has never smoked. She has never used smokeless tobacco.  Recent Labs    02/08/23 0701  HGBA1C 5.5    Objective:  VS:  HT:    WT:   BMI:     BP:   HR: bpm  TEMP: ( )  RESP:  Physical Exam Vitals and nursing note reviewed.   HENT:     Head: Normocephalic and atraumatic.     Right Ear: External ear normal.     Left Ear: External ear normal.     Nose: Nose normal.     Mouth/Throat:     Mouth: Mucous membranes are moist.  Eyes:     Extraocular Movements: Extraocular movements intact.  Cardiovascular:     Rate and Rhythm: Normal rate.     Pulses: Normal pulses.  Pulmonary:     Effort: Pulmonary effort is normal.  Abdominal:     General: Abdomen is flat. There is no distension.  Musculoskeletal:        General: Tenderness present.     Cervical back: Normal range of motion.     Comments: Patient rises from seated position to standing without difficulty. Good lumbar range of motion. No pain noted with facet loading. 5/5 strength noted with bilateral hip flexion, knee flexion/extension, ankle dorsiflexion/plantarflexion and EHL. No clonus noted bilaterally. Pain upon palpation of right greater trochanter. No pain with internal/external rotation of bilateral hips. Sensation intact bilaterally. Negative slump test bilaterally. Ambulates without aid, gait steady.     Skin:    General: Skin is warm and dry.     Capillary Refill: Capillary refill takes less than 2 seconds.  Neurological:     General: No focal deficit present.     Mental Status: She is alert and oriented to person, place, and time.  Psychiatric:  Mood and Affect: Mood normal.        Behavior: Behavior normal.     Ortho Exam  Imaging: No results found.  Past Medical/Family/Surgical/Social History: Medications & Allergies reviewed per EMR, new medications updated. Patient Active Problem List   Diagnosis Date Noted   Clostridioides difficile infection 10/22/2023   Supraclavicular fossa fullness 09/24/2023   COVID-19 09/24/2023   Cough 06/28/2023   Degenerative tear of left medial meniscus 06/20/2023   History of skin cancer 05/20/2023   Melanocytic nevi of trunk 05/20/2023   Seborrheic keratoses 05/20/2023   Squamous cell  carcinoma of skin of right lower extremity 05/20/2023   Mitral regurgitation 02/22/2023   Aneurysm of ascending aorta without rupture (HCC) 02/18/2023   Cellulitis of left leg 02/18/2023   Closed comminuted fracture of left humerus 02/07/2023   Bradycardia 02/07/2023   Fall 02/07/2023   Chronic a-fib (HCC) 02/07/2023   Statin intolerance 12/31/2022   Acute pain of right knee 12/06/2022   Impetigo 10/01/2022   Insomnia 10/01/2022   Osteoporosis 06/11/2022   Weakness 04/25/2022   Weakness generalized 04/25/2022   Falls frequently 04/19/2022   Iron deficiency anemia 12/08/2021   Skin ulcer, limited to breakdown of skin (HCC) 11/22/2021   Anemia, iron deficiency 11/22/2021   Low serum vitamin B12 11/22/2021   Acute renal failure superimposed on stage 2 chronic kidney disease (HCC) 11/20/2021   ABLA (acute blood loss anemia) 11/20/2021   Hypotension 11/19/2021   Shortness of breath 06/12/2021   Right leg pain 02/21/2021   C. difficile colitis    Abnormal CT of the abdomen    Aortic atherosclerosis (HCC) 08/27/2020   Acute prerenal azotemia 08/27/2020   Shock circulatory (HCC) 08/27/2020   Metabolic acidosis with normal anion gap and bicarbonate losses 08/27/2020   Abdominal cramps 03/16/2020   Neurogenic orthostatic hypotension (HCC) 09/16/2019   Greater trochanteric pain syndrome of right lower extremity 07/30/2019   Abdominal pain 05/22/2018   Bradycardia with 41-50 beats per minute 12/27/2017   Right bundle branch block (RBBB) on electrocardiogram (ECG) 12/27/2017   Chronic kidney disease, stage 3b (HCC) 12/24/2017   Cellulitis of leg, right with large prepatella hematoma and open wounds 12/24/2017   Iliotibial band syndrome of right side 12/16/2017   Intervertebral lumbar disc disorder with myelopathy, lumbar region 04/25/2017   Family history of colon cancer in mother 01/22/2017   Primary osteoarthritis of both first carpometacarpal joints 01/02/2017   Pain 11/21/2016    Gastroenteritis 09/13/2016   Chronic anticoagulation 05/24/2015   Coronary artery disease involving native coronary artery of native heart with angina pectoris (HCC) 12/20/2014   Permanent atrial fibrillation (HCC) 11/21/2012   Greater trochanteric bursitis of both hips 05/21/2012   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   Bruising 12/18/2010   Osteoarthritis of left hip 07/03/2010   DEGENERATIVE DISC DISEASE, LUMBOSACRAL SPINE 05/18/2010   SYNCOPE 10/27/2008   Anxiety 05/21/2008   IBS 05/21/2008   Essential hypertension 06/16/2007   GERD (gastroesophageal reflux disease) 06/16/2007   Past Medical History:  Diagnosis Date   Acute blood loss anemia 12/25/2017   Allergic rhinitis    Anxiety    Arthritis    "my whole spine" (07/01/2017)   Arthritis    Atrial fibrillation (HCC)    Bowel obstruction (HCC)    in [place]   Bradycardia with 41-50 beats per minute 12/27/2017   Cancer (HCC)    Cellulitis of leg, right with large prepatella hematoma and open wounds 12/26/2017   CKD (chronic kidney  disease) stage 2, GFR 60-89 ml/min    Colon polyps    Coronary artery disease    10/18 PCI/DES to p/m LCx with cutting balloon to mLcx   Diverticulosis of colon    GERD (gastroesophageal reflux disease)    Hip bursitis 2010   Dr Wyline Mood, Post op seroma   History of colon polyps    HSV (herpes simplex virus) anogenital infection 07/2019   HTN (hypertension)    IBS (irritable bowel syndrome)    constipation predominant - Dr Kinnie Scales   Lichen sclerosus    Osteopenia 11/2016   T score -2.0 FRAX 15%/4.3%   PAC (premature atrial contraction)    Symptomatiic   Renal insufficiency    Right bundle branch block (RBBB) on electrocardiogram (ECG) 12/27/2017   Scoliosis    SVT (supraventricular tachycardia) (HCC)    brief history   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   biopsy showing vulvar atypia, possible VIN I   Family History  Problem Relation Age of Onset   Colon cancer  Mother        Dx age 41, died at age 36   Diabetes Father    Prostate cancer Father    Prostate cancer Brother    Pancreatic cancer Brother    Stomach cancer Son    Past Surgical History:  Procedure Laterality Date   ANTERIOR AND POSTERIOR VAGINAL REPAIR  01/2002   Hattie Perch 01/23/2011   APPENDECTOMY  1946   CARDIAC CATHETERIZATION  06/26/2017   CORONARY ANGIOPLASTY WITH STENT PLACEMENT  07/01/2017   CORONARY ATHERECTOMY N/A 07/01/2017   Procedure: CORONARY ATHERECTOMY;  Surgeon: Lyn Records, MD;  Location: [facility] IN[facility]IVE [facility];  Service: Cardiovascular;  Laterality: N/A;   CORONARY STENT INTERVENTION N/A 07/01/2017   Procedure: CORONARY STENT INTERVENTION;  Surgeon: Lyn Records, MD;  Location: [facility];  Service: Cardiovascular;  Laterality: N/A;   HAMMER TOE SURGERY     HEMORRHOID BANDING     HIP SURGERY Left 04/2009   hip examination under anesthesia followed by greater trochanteric bursectomy; iliotibial band tenotomy/notes 01/20/2011   I & D EXTREMITY Right 01/10/2018   Procedure: IRRIGATION AND DEBRIDEMENT RIGHT KNEE, APPLY WOUND VAC;  Surgeon: Nadara Mustard, MD;  Location: [facility] OR;  Service: Orthopedics;  Laterality: Right;   KNEE BURSECTOMY Right 04/2009   Hattie Perch 01/09/2011   LEFT ATRIAL APPENDAGE OCCLUSION N/A 08/24/2021   Procedure: LEFT ATRIAL APPENDAGE OCCLUSION;  Surgeon: Tonny Bollman, MD;  Location: [facility] INVASIVE CV LAB;  Service: Cardiovascular;  Laterality: N/A;   LEFT HEART CATH AND CORONARY ANGIOGRAPHY N/A 06/26/2017   Procedure: LEFT HEART CATH AND CORONARY ANGIOGRAPHY;  Surgeon: Lyn Records, MD;  Location: [facility];  Service: Cardiovascular;  Laterality: N/A;   PUBOVAGINAL SLING  01/2002   Hattie Perch 01/23/2011   REDUCTION MAMMAPLASTY     TEE WITHOUT CARDIOVERSION N/A 08/24/2021   Procedure: TRANSESOPHAGEAL ECHOCARDIOGRAM (TEE);  Surgeon: Tonny Bollman, MD;  Location: [facility] INVASIVE CV LAB;  Service: Cardiovascular;  Laterality: N/A;   TEMPORARY  PACEMAKER N/A 07/01/2017   Procedure: TEMPORARY PACEMAKER;  Surgeon: Lyn Records, MD;  Location: [facility] INVASIVE CV LAB;  Service: Cardiovascular;  Laterality: N/A;   VAGINAL HYSTERECTOMY  01/2002   Vaginal hysterectomy, bilateral salpingo-oophorectomy/notes 01/23/2011   Social History   Occupational History   Occupation: Engineer, manufacturing systems: RETIRED  Tobacco Use   Smoking status: Never   Smokeless tobacco: Never  Vaping Use   Vaping  status: Never Used  Substance and Sexual Activity   Alcohol use: Yes    Comment: moderatley [place] before dinner   Drug use: Never   Sexual activity: Not Currently    Birth control/protection: Surgical    Comment: hysterectomy; less than 5, IC after 16, no STD, no abnormal paps

## 2023-12-06 ENCOUNTER — Telehealth: Payer: Self-pay

## 2023-12-06 ENCOUNTER — Ambulatory Visit: Admitting: Radiology

## 2023-12-06 VITALS — BP 120/80 | Temp 97.7°F

## 2023-12-06 DIAGNOSIS — R300 Dysuria: Secondary | ICD-10-CM | POA: Diagnosis not present

## 2023-12-06 NOTE — Progress Notes (Signed)
 SUBJECTIVE: Hailey [name] is a 89 y.o. female who complains of dysuria, frequency, no urgency. Symptoms began 4 days ago.   Allergies  Allergen Reactions   Macrobid [Nitrofurantoin Monohyd Macro] Other (See Comments)    Nausea, stomach cramps, fatigue , headache.   Meloxicam Other (See Comments)    Jittery and headache   Digoxin And Related Other (See Comments)    headaches   Diprolene [Betamethasone Dipropionate Aug] Other (See Comments)    Patient states that the bruising was made worse when using this   Ferrous Sulfate Other (See Comments)    Bad constipation    Keflex [Cephalexin] Nausea And Vomiting    Nausea, fatigue, and headache.   Lipitor [Atorvastatin]     Abd pain   Mobic [Meloxicam] Other (See Comments)    Unsure of reaction type   Sulfamethoxazole-Trimethoprim Nausea Only    Current Outpatient Medications on File Prior to Visit  Medication Sig Dispense Refill   acetaminophen (TYLENOL) 500 MG tablet Take 1,000 mg by mouth every 6 (six) hours as needed (body aches / sore hip). Maximum of 6 tablets daily (3000mg )     BIOTIN PO Take 5,000 mcg by mouth in the morning.     clobetasol ointment (TEMOVATE) 0.05 % Apply to the affected area in a thin layer twice a day for 2 weeks as needed for a flare.  Then apply to the area twice a week at bedtime as maintenance dosing. 60 g 1   diclofenac Sodium (VOLTAREN) 1 % GEL Apply 2 g topically 4 (four) times daily as needed (back pain.).     dicyclomine (BENTYL) 20 MG tablet Take 1 by mouth every 4-6 hours as needed for cramping 30 tablet 6   ELIQUIS 2.5 MG TABS tablet TAKE 1 TABLET BY MOUTH TWICE  DAILY 200 tablet 2   empagliflozin (JARDIANCE) 10 MG TABS tablet Take 1 tablet (10 mg total) by mouth daily before breakfast. 90 tablet 2   escitalopram (LEXAPRO) 10 MG tablet Take 1 tablet (10 mg total) by mouth daily. 30 tablet 11   esomeprazole (NEXIUM) 40 MG capsule TAKE ONE CAPSULE ONCE DAILY 90 capsule 1   Homeopathic  Products (ARNICARE) GEL Apply 1 application  topically daily as needed (skin bruising).     hyoscyamine (LEVSIN) 0.125 MG tablet TAKE 1-2 TABLETS EVERY 4 HOURS AS NEEDED FOR UP TO 10 DAYS FOR CRAMPING. 100 tablet 3   lidocaine (XYLOCAINE) 5 % ointment Apply 1 Application topically 3 (three) times daily. Uses as needed three times a day for pain of the vulva. 1.25 g 1   LORazepam (ATIVAN) 1 MG tablet TAKE ONE TABLET BY MOUTH TWICE DAILY AS NEEDED FOR ANXIETY / SLEEP. 180 tablet 1   losartan (COZAAR) 25 MG tablet Take 1 tablet (25 mg total) by mouth daily. 90 tablet 3   nitroGLYCERIN (NITROSTAT) 0.4 MG SL tablet Place 1 tablet (0.4 mg total) under the tongue every 5 (five) minutes as needed for chest pain (Call 911 at 3rd dose within 15 minutes.). 20 tablet 3   NON FORMULARY Vitamin c, vitamin d     Polyethyl Glycol-Propyl Glycol (LUBRICANT EYE DROPS) 0.4-0.3 % SOLN Place 1-2 drops into both eyes at bedtime.     polyethylene glycol (MIRALAX / GLYCOLAX) 17 g packet Take 17 g by mouth as needed for mild constipation or moderate constipation.     cyclobenzaprine (FLEXERIL) 10 MG tablet Take 1 tablet (10 mg total) by mouth 3 (three)  times daily as needed for muscle spasms. (Patient not taking: Reported on 12/06/2023) 30 tablet 0   ipratropium (ATROVENT) 0.06 % nasal spray Place 2 sprays into the nose 4 (four) times daily. (Patient not taking: Reported on 12/06/2023) 15 mL 5   sodium chloride (OCEAN) 0.65 % SOLN nasal spray Place 1 spray into both nostrils at bedtime as needed for congestion. (Patient not taking: Reported on 12/06/2023)     No current facility-administered medications on file prior to visit.    Past Medical History:  Diagnosis Date   Acute blood loss anemia 12/25/2017   Allergic rhinitis    Anxiety    Arthritis    "my whole spine" (07/01/2017)   Arthritis    Atrial fibrillation (HCC)    Bowel obstruction (HCC)    in [place]   Bradycardia with 41-50 beats per minute 12/27/2017    Cancer (HCC)    Cellulitis of leg, right with large prepatella hematoma and open wounds 12/26/2017   CKD (chronic kidney disease) stage 2, GFR 60-89 ml/min    Colon polyps    Coronary artery disease    10/18 PCI/DES to p/m LCx with cutting balloon to mLcx   Diverticulosis of colon    GERD (gastroesophageal reflux disease)    Hip bursitis 2010   Dr Wyline Mood, Post op seroma   History of colon polyps    HSV (herpes simplex virus) anogenital infection 07/2019   HTN (hypertension)    IBS (irritable bowel syndrome)    constipation predominant - Dr Kinnie Scales   Lichen sclerosus    Osteopenia 11/2016   T score -2.0 FRAX 15%/4.3%   PAC (premature atrial contraction)    Symptomatiic   Renal insufficiency    Right bundle branch block (RBBB) on electrocardiogram (ECG) 12/27/2017   Scoliosis    SVT (supraventricular tachycardia) (HCC)    brief history   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  Vital signs are normal. The abdomen is soft without tenderness, guarding, mass, rebound or organomegaly. No CVA tenderness or inguinal adenopathy noted. Urine dipstick shows positive for RBC's, positive for glucose, and positive for leukocytes.  Micro exam: 6-10 WBC's per HPF and few+ bacteria.    Blood pressure 120/80, temperature 97.7 F (36.5 C), temperature source Oral.     ASSESSMENT/PLAN: 1. Dysuria (Primary) - Urinalysis,Complete w/RFL Culture  Will send urine culture and sensitivity.   Push fluids, avoid bladder irritants. Instructed she may use Pyridium OTC prn. Call or return to clinic prn if these symptoms worsen or fail to improve as anticipated. Pyelo precautions reviewed with patient. PT would prefer to wait for antibiotics until culture is back.   Kaeo Jacome B, NP 3:08 PM

## 2023-12-06 NOTE — Telephone Encounter (Signed)
 BS pt LVM in triage line stating that she is currently having sxs suspected of a UTI but is also being tx'd for a chronic vulvar sxs (LS) by BS and she is not sure which is causing the discomfort with burning. Please advise.   Spoke w/ the pt and she reports burning with urination. Denies frequency or urgency.   Reports using clobetasol for recent possible flare ~2-3x a day for about 4 days now.   Pt took a recent trip [place]. Pt reports being in bathing suit for a good amt of time but it wasn't always wet and had some sweating.   Please advise.

## 2023-12-06 NOTE — Telephone Encounter (Signed)
 Per EB: "Come in for exam. Might need a punch biopsy and cultures. She can stop by and drop off a UA/UC today. Dr. Karma Greaser"  Appt scheduled w/ JC today and BS next week.   Routing to providers for final review. Encounter closed.

## 2023-12-08 LAB — URINALYSIS, COMPLETE W/RFL CULTURE
Bilirubin Urine: NEGATIVE
Hyaline Cast: NONE SEEN /LPF
Ketones, ur: NEGATIVE
Nitrites, Initial: NEGATIVE
Protein, ur: NEGATIVE
Specific Gravity, Urine: 1.015 (ref 1.001–1.035)
pH: 6 (ref 5.0–8.0)

## 2023-12-08 LAB — URINE CULTURE
MICRO NUMBER:: 16261067
Result:: NO GROWTH
SPECIMEN QUALITY:: ADEQUATE

## 2023-12-08 LAB — CULTURE INDICATED

## 2023-12-10 ENCOUNTER — Encounter: Payer: Self-pay | Admitting: Obstetrics and Gynecology

## 2023-12-10 ENCOUNTER — Ambulatory Visit: Admitting: Obstetrics and Gynecology

## 2023-12-10 VITALS — BP 126/80 | HR 67 | Ht 64.0 in | Wt 141.0 lb

## 2023-12-10 DIAGNOSIS — N766 Ulceration of vulva: Secondary | ICD-10-CM

## 2023-12-10 DIAGNOSIS — N760 Acute vaginitis: Secondary | ICD-10-CM | POA: Diagnosis not present

## 2023-12-10 LAB — WET PREP FOR TRICH, YEAST, CLUE

## 2023-12-10 MED ORDER — TERCONAZOLE 0.4 % VA CREA
1.0000 | TOPICAL_CREAM | Freq: Every day | VAGINAL | 0 refills | Status: DC
Start: 2023-12-10 — End: 2024-04-27

## 2023-12-10 NOTE — Progress Notes (Signed)
 GYNECOLOGY  VISIT   HPI: 89 y.o.   Married  Caucasian female   G2P2000 with No LMP recorded. Patient has had a hysterectomy.   here for: vulvar itching, burning and pain. Reports dysuria but refused treatment for UTI on 3/28. Pt reports symptoms are better today.  UC negative on 12/06/23.   Symptoms have been vaginal and vulvar.   Feeling spasms in her vagina.   States clobetasol she uses for lichen sclerosus makes her symptoms worse.   The lidocaine made the pain worse externally on the vulva but reduced the internal pain.   Aquafor has helped a lot, but still having itching and burning.   No vaginal discharge.  Possible odor but it is not constant.  Was in [place] and been in a pool a lot.   No hx of HSV I or II.   Hx Atrial fibrillation.  On Eliquis.  GYNECOLOGIC HISTORY: No LMP recorded. Patient has had a hysterectomy. Contraception:  hyst Menopausal hormone therapy:  n/a Last 2 paps:  many years ago History of abnormal Pap or positive HPV:  no Mammogram:  05/30/23 Breast Density Cat C, BI-RADS CAT 1 neg         OB History     Gravida  2   Para  2   Term  2   Preterm  0   AB  0   Living         SAB  0   IAB  0   Ectopic  0   Multiple      Live Births                 Patient Active Problem List   Diagnosis Date Noted   Clostridioides difficile infection 10/22/2023   Supraclavicular fossa fullness 09/24/2023   COVID-19 09/24/2023   Cough 06/28/2023   Degenerative tear of left medial meniscus 06/20/2023   History of skin cancer 05/20/2023   Melanocytic nevi of trunk 05/20/2023   Seborrheic keratoses 05/20/2023   Squamous cell carcinoma of skin of right lower extremity 05/20/2023   Mitral regurgitation 02/22/2023   Aneurysm of ascending aorta without rupture (HCC) 02/18/2023   Cellulitis of left leg 02/18/2023   Closed comminuted fracture of left humerus 02/07/2023   Bradycardia 02/07/2023   Fall 02/07/2023   Chronic a-fib (HCC)  02/07/2023   Statin intolerance 12/31/2022   Acute pain of right knee 12/06/2022   Impetigo 10/01/2022   Insomnia 10/01/2022   Osteoporosis 06/11/2022   Weakness 04/25/2022   Weakness generalized 04/25/2022   Falls frequently 04/19/2022   Iron deficiency anemia 12/08/2021   Skin ulcer, limited to breakdown of skin (HCC) 11/22/2021   Anemia, iron deficiency 11/22/2021   Low serum vitamin B12 11/22/2021   Acute renal failure superimposed on stage 2 chronic kidney disease (HCC) 11/20/2021   ABLA (acute blood loss anemia) 11/20/2021   Hypotension 11/19/2021   Shortness of breath 06/12/2021   Right leg pain 02/21/2021   C. difficile colitis    Abnormal CT of the abdomen    Aortic atherosclerosis (HCC) 08/27/2020   Acute prerenal azotemia 08/27/2020   Shock circulatory (HCC) 08/27/2020   Metabolic acidosis with normal anion gap and bicarbonate losses 08/27/2020   Abdominal cramps 03/16/2020   Neurogenic orthostatic hypotension (HCC) 09/16/2019   Greater trochanteric pain syndrome of right lower extremity 07/30/2019   Abdominal pain 05/22/2018   Bradycardia with 41-50 beats per minute 12/27/2017   Right bundle branch block (RBBB) on electrocardiogram (  ECG) 12/27/2017   Chronic kidney disease, stage 3b (HCC) 12/24/2017   Cellulitis of leg, right with large prepatella hematoma and open wounds 12/24/2017   Iliotibial band syndrome of right side 12/16/2017   Intervertebral lumbar disc disorder with myelopathy, lumbar region 04/25/2017   Family history of colon cancer in mother 01/22/2017   Primary osteoarthritis of both first carpometacarpal joints 01/02/2017   Pain 11/21/2016   Gastroenteritis 09/13/2016   Chronic anticoagulation 05/24/2015   Coronary artery disease involving native coronary artery of native heart with angina pectoris (HCC) 12/20/2014   Permanent atrial fibrillation (HCC) 11/21/2012   Greater trochanteric bursitis of both hips 05/21/2012   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   Bruising 12/18/2010   Osteoarthritis of left hip 07/03/2010   DEGENERATIVE DISC DISEASE, LUMBOSACRAL SPINE 05/18/2010   SYNCOPE 10/27/2008   Anxiety 05/21/2008   IBS 05/21/2008   Essential hypertension 06/16/2007   GERD (gastroesophageal reflux disease) 06/16/2007    Past Medical History:  Diagnosis Date   Acute blood loss anemia 12/25/2017   Allergic rhinitis    Anxiety    Arthritis    "my whole spine" (07/01/2017)   Arthritis    Atrial fibrillation (HCC)    Bowel obstruction (HCC)    in [place]   Bradycardia with 41-50 beats per minute 12/27/2017   Cancer (HCC)    Cellulitis of leg, right with large prepatella hematoma and open wounds 12/26/2017   CKD (chronic kidney disease) stage 2, GFR 60-89 ml/min    Colon polyps    Coronary artery disease    10/18 PCI/DES to p/m LCx with cutting balloon to mLcx   Diverticulosis of colon    GERD (gastroesophageal reflux disease)    Hip bursitis 2010   Dr Wyline Mood, Post op seroma   History of colon polyps    HSV (herpes simplex virus) anogenital infection 07/2019   HTN (hypertension)    IBS (irritable bowel syndrome)    constipation predominant - Dr Kinnie Scales   Lichen sclerosus    Osteopenia 11/2016   T score -2.0 FRAX 15%/4.3%   PAC (premature atrial contraction)    Symptomatiic   Renal insufficiency    Right bundle branch block (RBBB) on electrocardiogram (ECG) 12/27/2017   Scoliosis    SVT (supraventricular tachycardia) (HCC)    brief history   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   biopsy showing vulvar atypia, possible VIN I    Past Surgical History:  Procedure Laterality Date   ANTERIOR AND POSTERIOR VAGINAL REPAIR  01/2002   Hattie Perch 01/23/2011   APPENDECTOMY  1946   CARDIAC CATHETERIZATION  06/26/2017   CORONARY ANGIOPLASTY WITH STENT PLACEMENT  07/01/2017   CORONARY ATHERECTOMY N/A 07/01/2017   Procedure: CORONARY ATHERECTOMY;  Surgeon: Lyn Records, MD;  Location: [facility] INVASIVE CV LAB;   Service: Cardiovascular;  Laterality: N/A;   CORONARY STENT INTERVENTION N/A 07/01/2017   Procedure: CORONARY STENT INTERVENTION;  Surgeon: Lyn Records, MD;  Location: [facility] CV [facility];  Service: Cardiovascular;  Laterality: N/A;   HAMMER TOE SURGERY     HEMORRHOID BANDING     HIP SURGERY Left 04/2009   hip examination under anesthesia followed by greater trochanteric bursectomy; iliotibial band tenotomy/notes 01/20/2011   I & D EXTREMITY Right 01/10/2018   Procedure: IRRIGATION AND DEBRIDEMENT RIGHT KNEE, APPLY WOUND VAC;  Surgeon: Nadara Mustard, MD;  Location: [facility] OR;  Service: Orthopedics;  Laterality: Right;   KNEE BURSECTOMY Right 04/2009   Hattie Perch 01/09/2011  LEFT ATRIAL APPENDAGE OCCLUSION N/A 08/24/2021   Procedure: LEFT ATRIAL APPENDAGE OCCLUSION;  Surgeon: Tonny Bollman, MD;  Location: [facility] INVASIVE CV LAB;  Service: Cardiovascular;  Laterality: N/A;   LEFT HEART CATH AND CORONARY ANGIOGRAPHY N/A 06/26/2017   Procedure: LEFT HEART CATH AND CORONARY ANGIOGRAPHY;  Surgeon: Lyn Records, MD;  Location: [facility];  Service: Cardiovascular;  Laterality: N/A;   PUBOVAGINAL SLING  01/2002   Hattie Perch 01/23/2011   REDUCTION MAMMAPLASTY     TEE WITHOUT CARDIOVERSION N/A 08/24/2021   Procedure: TRANSESOPHAGEAL ECHOCARDIOGRAM (TEE);  Surgeon: Tonny Bollman, MD;  Location: [facility] INVASIVE CV LAB;  Service: Cardiovascular;  Laterality: N/A;   TEMPORARY PACEMAKER N/A 07/01/2017   Procedure: TEMPORARY PACEMAKER;  Surgeon: Lyn Records, MD;  Location: [facility] INVASIVE CV LAB;  Service: Cardiovascular;  Laterality: N/A;   VAGINAL HYSTERECTOMY  01/2002   Vaginal hysterectomy, bilateral salpingo-oophorectomy/notes 01/23/2011    Current Outpatient Medications  Medication Sig Dispense Refill   acetaminophen (TYLENOL) 500 MG tablet Take 1,000 mg by mouth every 6 (six) hours as needed (body aches / sore hip). Maximum of 6 tablets daily (3000mg )     BIOTIN PO Take 5,000 mcg by mouth in the morning.      clobetasol ointment (TEMOVATE) 0.05 % Apply to the affected area in a thin layer twice a day for 2 weeks as needed for a flare.  Then apply to the area twice a week at bedtime as maintenance dosing. 60 g 1   cyclobenzaprine (FLEXERIL) 10 MG tablet Take 1 tablet (10 mg total) by mouth 3 (three) times daily as needed for muscle spasms. 30 tablet 0   diclofenac Sodium (VOLTAREN) 1 % GEL Apply 2 g topically 4 (four) times daily as needed (back pain.).     dicyclomine (BENTYL) 20 MG tablet Take 1 by mouth every 4-6 hours as needed for cramping 30 tablet 6   ELIQUIS 2.5 MG TABS tablet TAKE 1 TABLET BY MOUTH TWICE  DAILY 200 tablet 2   empagliflozin (JARDIANCE) 10 MG TABS tablet Take 1 tablet (10 mg total) by mouth daily before breakfast. 90 tablet 2   escitalopram (LEXAPRO) 10 MG tablet Take 1 tablet (10 mg total) by mouth daily. 30 tablet 11   esomeprazole (NEXIUM) 40 MG capsule TAKE ONE CAPSULE ONCE DAILY 90 capsule 1   Homeopathic Products (ARNICARE) GEL Apply 1 application  topically daily as needed (skin bruising).     hyoscyamine (LEVSIN) 0.125 MG tablet TAKE 1-2 TABLETS EVERY 4 HOURS AS NEEDED FOR UP TO 10 DAYS FOR CRAMPING. 100 tablet 3   ipratropium (ATROVENT) 0.06 % nasal spray Place 2 sprays into the nose 4 (four) times daily. 15 mL 5   lidocaine (XYLOCAINE) 5 % ointment Apply 1 Application topically 3 (three) times daily. Uses as needed three times a day for pain of the vulva. 1.25 g 1   LORazepam (ATIVAN) 1 MG tablet TAKE ONE TABLET BY MOUTH TWICE DAILY AS NEEDED FOR ANXIETY / SLEEP. 180 tablet 1   losartan (COZAAR) 25 MG tablet Take 1 tablet (25 mg total) by mouth daily. 90 tablet 3   nitroGLYCERIN (NITROSTAT) 0.4 MG SL tablet Place 1 tablet (0.4 mg total) under the tongue every 5 (five) minutes as needed for chest pain (Call 911 at 3rd dose within 15 minutes.). 20 tablet 3   NON FORMULARY Vitamin c, vitamin d     Polyethyl Glycol-Propyl Glycol (LUBRICANT EYE DROPS) 0.4-0.3 % SOLN Place  1-2 drops into both  eyes at bedtime.     polyethylene glycol (MIRALAX / GLYCOLAX) 17 g packet Take 17 g by mouth as needed for mild constipation or moderate constipation.     sodium chloride (OCEAN) 0.65 % SOLN nasal spray Place 1 spray into both nostrils at bedtime as needed for congestion.     No current facility-administered medications for this visit.     ALLERGIES: Macrobid [nitrofurantoin monohyd macro], Meloxicam, Digoxin and related, Diprolene [betamethasone dipropionate aug], Ferrous sulfate, Keflex [cephalexin], Lipitor [atorvastatin], Mobic [meloxicam], and Sulfamethoxazole-trimethoprim  Family History  Problem Relation Age of Onset   Colon cancer Mother        Dx age 70, died at age 64   Diabetes Father    Prostate cancer Father    Prostate cancer Brother    Pancreatic cancer Brother    Stomach cancer Son     Social History   Socioeconomic History   Marital status: Married    Spouse name: [name]   Number of children: 2   Years of education: Not on file   Highest education level: Bachelor's degree (e.g., BA, AB, BS)  Occupational History   Occupation: Engineer, manufacturing systems: RETIRED  Tobacco Use   Smoking status: Never   Smokeless tobacco: Never  Vaping Use   Vaping status: Never Used  Substance and Sexual Activity   Alcohol use: Yes    Comment: moderatley [place] before dinner   Drug use: Never   Sexual activity: Not Currently    Birth control/protection: Surgical    Comment: hysterectomy; less than 5, IC after 18, no STD, no abnormal paps  Other Topics Concern   Not on file  Social History Narrative   ** Merged History Encounter ** Regular Exercise -  YES      Lives at home with husband/2025   Social Drivers of Health   Financial Resource Strain: Low Risk  (11/22/2023)   Overall Financial Resource Strain (CARDIA)    Difficulty of Paying Living Expenses: Not hard at all  Food Insecurity: No Food Insecurity (11/22/2023)   Hunger Vital Sign    Worried  About Running Out of Food in the Last Year: Never true    Ran Out of Food in the Last Year: Never true  Transportation Needs: No Transportation Needs (11/22/2023)   PRAPARE - Administrator, Civil Service (Medical): No    Lack of Transportation (Non-Medical): No  Physical Activity: Insufficiently Active (11/22/2023)   Exercise Vital Sign    Days of Exercise per Week: 2 days    Minutes of Exercise per Session: 60 min  Stress: No Stress Concern Present (11/22/2023)   [employer] of Occupational Health - Occupational Stress Questionnaire    Feeling of Stress : Only a little  Social Connections: Moderately Isolated (11/22/2023)   Social Connection and Isolation Panel [NHANES]    Frequency of Communication with Friends and Family: More than three times a week    Frequency of Social Gatherings with Friends and Family: Three times a week    Attends Religious Services: Never    Active Member of Clubs or Organizations: No    Attends Banker Meetings: Never    Marital Status: Married  Catering manager Violence: Not At Risk (11/22/2023)   Humiliation, Afraid, Rape, and Kick questionnaire    Fear of Current or Ex-Partner: No    Emotionally Abused: No    Physically Abused: No    Sexually Abused: No    Review of  Systems  Genitourinary:  Positive for dysuria.    PHYSICAL EXAMINATION:   BP 126/80 (BP Location: Right Arm, Patient Position: Sitting)   Pulse 67   Ht 5\' 4"  (1.626 m)   Wt 141 lb (64 kg)   SpO2 99%   BMI 24.20 kg/m     General appearance: alert, cooperative and appears stated age   Pelvic: External genitalia:  multiple ulcers of the left labia minora.                Urethra:  normal appearing urethra with no masses, tenderness or lesions              Bartholins and Skenes: normal                 Vagina: normal appearing vagina with normal color and discharge, no lesions              Cervix:  absent.                Bimanual Exam:  Uterus:  absent.                Adnexa: no mass, fullness, tenderness       Chaperone was present for exam:  Warren Lacy, CMA  ASSESSMENT:  Vulvovaginitis.  Vulvar ulceration.  Lichen sclerosis.  Atrial fibrillation.  On Eliquis.  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  PLAN:  Nuswab for HSV I and II.  Wet prep:  yeast present, negative clue cells, negative trichomonas.  Rx for Terazol 7 cream nightly vaginally for 7 nights.   Avoid using clobetasol ointment while treating yeast infection.  Consider potential vaginal estrogen cream.   35 min  total time was spent for this patient encounter, including preparation, face-to-face counseling with the patient, coordination of care, and documentation of the encounter.

## 2023-12-11 ENCOUNTER — Encounter: Payer: Self-pay | Admitting: Internal Medicine

## 2023-12-11 ENCOUNTER — Ambulatory Visit: Attending: Internal Medicine | Admitting: Internal Medicine

## 2023-12-11 ENCOUNTER — Ambulatory Visit: Admitting: Obstetrics and Gynecology

## 2023-12-11 VITALS — BP 118/74 | HR 75 | Resp 16 | Ht 64.0 in | Wt 145.8 lb

## 2023-12-11 DIAGNOSIS — I10 Essential (primary) hypertension: Secondary | ICD-10-CM | POA: Diagnosis not present

## 2023-12-11 DIAGNOSIS — I700 Atherosclerosis of aorta: Secondary | ICD-10-CM

## 2023-12-11 DIAGNOSIS — I7121 Aneurysm of the ascending aorta, without rupture: Secondary | ICD-10-CM | POA: Diagnosis not present

## 2023-12-11 DIAGNOSIS — E785 Hyperlipidemia, unspecified: Secondary | ICD-10-CM

## 2023-12-11 DIAGNOSIS — I25119 Atherosclerotic heart disease of native coronary artery with unspecified angina pectoris: Secondary | ICD-10-CM | POA: Diagnosis not present

## 2023-12-11 DIAGNOSIS — I4821 Permanent atrial fibrillation: Secondary | ICD-10-CM | POA: Diagnosis not present

## 2023-12-11 DIAGNOSIS — N1831 Chronic kidney disease, stage 3a: Secondary | ICD-10-CM

## 2023-12-11 DIAGNOSIS — I5032 Chronic diastolic (congestive) heart failure: Secondary | ICD-10-CM | POA: Diagnosis not present

## 2023-12-11 DIAGNOSIS — D6869 Other thrombophilia: Secondary | ICD-10-CM | POA: Diagnosis not present

## 2023-12-11 NOTE — Patient Instructions (Signed)
 Medication Instructions:  No changes *If you need a refill on your cardiac medications before your next appointment, please call your pharmacy*  Lab Work: none If you have labs (blood work) drawn today and your tests are completely normal, you will receive your results only by: MyChart Message (if you have MyChart) OR A paper copy in the mail If you have any lab test that is abnormal or we need to change your treatment, we will call you to review the results.  Testing/Procedures: none  Follow-Up: At [facility], you and your health needs are our priority.  As part of our continuing mission to provide you with exceptional heart care, our providers are all part of one team.  This team includes your primary Cardiologist (physician) and Advanced Practice Providers or APPs (Physician Assistants and Nurse Practitioners) who all work together to provide you with the care you need, when you need it.  Your next appointment:   6 month(s)  Provider:   Orbie Pyo, MD     We recommend signing up for the patient portal called "MyChart".  Sign up information is provided on this After Visit Summary.  MyChart is used to connect with patients for Virtual Visits (Telemedicine).  Patients are able to view lab/test results, encounter notes, upcoming appointments, etc.  Non-urgent messages can be sent to your provider as well.   To learn more about what you can do with MyChart, go to [email or web address].      1st Floor: - Lobby - Registration  - Pharmacy  - Lab - Cafe  2nd Floor: - PV Lab - Diagnostic Testing (echo, CT, nuclear med)  3rd Floor: - Vacant  4th Floor: - TCTS (cardiothoracic surgery) - AFib Clinic - Structural Heart Clinic - Vascular Surgery  - Vascular Ultrasound  5th Floor: - [facility] Cardiology (general and EP) - Clinical Pharmacy for coumadin, hypertension, lipid, weight-loss medications, and med management appointments    Valet parking services  will be available as well.

## 2023-12-11 NOTE — Progress Notes (Signed)
 Cardiology Office Note:   Date:  12/11/2023  ID:  Hailey [name], DOB Jun 23, 1935, MRN [id number] PCP:  Tresa Garter, MD  [facility] Providers Cardiologist:  Alverda Skeans, MD Referring MD: Tresa Garter, MD   Chief Complaint/Reason for Referral: Cardiology follow-up ASSESSMENT:    1. Coronary artery disease involving native coronary artery of native heart with angina pectoris (HCC)   2. Hyperlipidemia LDL goal <70   3. Aortic atherosclerosis (HCC)   4. Permanent atrial fibrillation (HCC)   5. Secondary hypercoagulable state (HCC)   6. Stage 3a chronic kidney disease (HCC)   7. Chronic diastolic HF (heart failure) (HCC)   8. Primary hypertension   9. Aneurysm of ascending aorta without rupture (HCC)      PLAN:   In order of problems listed above: Coronary artery disease: Continue Eliquis 2.5 mg twice daily instead of aspirin; patient defers statins.   Hyperlipidemia: Patient defers statins.  LDL has been fairly well-controlled without statins.  Last LDL was 57.  Check lipid panel next year.   Aortic atherosclerosis: Continue Eliquis 2.5 mg twice daily and patient defers statins.   Permanent atrial fibrillation: Continue Eliquis 2.5 mg twice daily  Secondary hypercoagulable state: Continue Eliquis 2.5 mg twice daily  Stage III chronic kidney disease: Continue losartan 25 mg daily and Jardiance 10 mg daily for renal protection.   Chronic diastolic heart failure: Continue losartan 25 mg daily and Jardiance 10 mg daily Hypertension: Blood pressure is well-controlled on her current regimen.  Blood pressure is well-controlled. Ascending aortic aneurysm: Chest CT 2024 demonstrated ascending aortic aneurysm of 4.4 cm.  I viewed the last few CT scans with the patient.  Her thoracic aorta has been stable in size at about 4.5 cm.  We discussed obtaining another CT scan and what that would mean if she reaches the threshold for repair.  Given that she is almost 89 years old and  she does not desire an extensive surgical procedure with shared decision making we elected not to pursue serial CT scans to monitor this further as it would not change medical management.  We just did discuss maybe starting a low-dose beta-blocker but she has had issues with low blood pressure in the past with this.  Will continue current management with losartan 25 mg.     Dispo:  Return in about 6 months (around 06/11/2024).      Medication Adjustments/Labs and Tests Ordered: Current medicines are reviewed at length with the patient today.  Concerns regarding medicines are outlined above.  The following changes have been made:  no change   Labs/tests ordered: No orders of the defined types were placed in this encounter.   Medication Changes: No orders of the defined types were placed in this encounter.   Current medicines are reviewed at length with the patient today.  The patient does not have concerns regarding medicines.  History of Present Illness:    FOCUSED PROBLEM LIST:   Coronary artery disease S/p orbital atherectomy + 4 x 26 mm DES to pRCA on 07/02/17 LHC 06/26/17: 3v heavy Ca2++, pRCA 85-90, mRCA 50-60, dLM 25, mLAD 50, EF 60 Myoview 09/26/2018: EF 71, no ischemia or infarction, low risk  (HFpEF) heart failure with preserved ejection fraction  TTE 02/17/20: EF 65-70, no RWMA, mild LVH, Gr 2 DD, mod reduced RVSF, mod pulm hypertension, RVSP 58.7, mod BAE, mild MR, mod TR, AV sclerosis, RAP 8 TEE 08/24/21: EF 60-65, no RWMA, NL RVSF, NL PASP, severe LAE,  small eff, mild MR, asc Aorta 40 mm Permanent atrial fibrillation CV 2 score of 6 Attempted LAAOD (Watchman) in 08/2021 unsuccessful 2/2 unfavorable anatomy Mitral regurgitation Mild to moderate TTE 2024 Tricuspid regurgitation  Mild TTE 2024 Hx of falls Now on low dose Eliquis Chronic kidney disease stage IIIA Hypertension  Hx of orthostatic hypotension Right Bundle Branch Block  Supraventricular Tachycardia    Ascending aortic aneurysm  TTE 08/2021: 40 mm CTA 2021 thoracic aortic diameter of 4.5 cm CT 02/07/23: 4.4 cm  Lung nodule CT 01/2023: 4 mm RUL Hyperlipidemia Patient defers statins Aortic atherosclerosis  CT abdomen pelvis September 2024  October 2024: The patient is seen for routine follow-up.  She was seen in June for hospital follow-up.  She was admitted with mechanical fall and suffered left humerus fracture that was treated conservatively.  She was noted to have some bradycardia.  Her metoprolol was discontinued and 30-day monitor was arranged.  This monitor demonstrated 100% burden of atrial fibrillation with nocturnal bradycardia.  Today her major issue is still recuperating from her fall.  She is participating in physical therapy.  She has had no issues with her Eliquis other than nuisance lower extremity bruising.  She has had no severe bleeding.  Her breathing is fairly stable.  She will get a little bit more short of breath when she exerts herself more than moderately.  She denies any exertional angina.  She has had no recurrent falls, lightheadedness, or signs or symptoms of stroke.  She does feel palpitations occasionally typically when she lays down to go to sleep.  They are not associate with shortness of breath, chest pain, or any other issues other than being noticeable.  She is otherwise well and without significant complaints.  Plan: Restart Toprol on a as needed basis for palpitations.  April 2025:  Patient consents to use of AI scribe. The patient returns for routine follow-up.   She is doing well from a cardiovascular standpoint.  She is currently on losartan for this condition. She previously tried metoprolol but experienced hypotension. She is not on any cholesterol medication, and her cholesterol levels are reportedly good. No chest pain during physical activity.  She is on a blood thinner and experiences significant bruising on her legs, which she finds distressing. She  denies taking aspirin. She previously attempted a Watchman device, which was unsuccessful.  She experiences indigestion when lying down to read at night, typically a few hours after eating. She finds relief by chewing a Pepcid Complete.  She reports ongoing issues with her lower back and upper thigh area. She received an injection for her lower back pain, which did not alleviate her symptoms. She is scheduled to see Dr. Alvester Morin for further treatment, specifically an injection targeting her upper thigh and hip area.     Current Medications: Current Meds  Medication Sig   acetaminophen (TYLENOL) 500 MG tablet Take 1,000 mg by mouth every 6 (six) hours as needed (body aches / sore hip). Maximum of 6 tablets daily (3000mg )   BIOTIN PO Take 5,000 mcg by mouth in the morning.   clobetasol ointment (TEMOVATE) 0.05 % Apply to the affected area in a thin layer twice a day for 2 weeks as needed for a flare.  Then apply to the area twice a week at bedtime as maintenance dosing.   diclofenac Sodium (VOLTAREN) 1 % GEL Apply 2 g topically 4 (four) times daily as needed (back pain.).   dicyclomine (BENTYL) 20 MG tablet Take 1  by mouth every 4-6 hours as needed for cramping   ELIQUIS 2.5 MG TABS tablet TAKE 1 TABLET BY MOUTH TWICE  DAILY   empagliflozin (JARDIANCE) 10 MG TABS tablet Take 1 tablet (10 mg total) by mouth daily before breakfast.   escitalopram (LEXAPRO) 10 MG tablet Take 1 tablet (10 mg total) by mouth daily.   esomeprazole (NEXIUM) 40 MG capsule TAKE ONE CAPSULE ONCE DAILY   Homeopathic Products (ARNICARE) GEL Apply 1 application  topically daily as needed (skin bruising).   hyoscyamine (LEVSIN) 0.125 MG tablet TAKE 1-2 TABLETS EVERY 4 HOURS AS NEEDED FOR UP TO 10 DAYS FOR CRAMPING.   LORazepam (ATIVAN) 1 MG tablet TAKE ONE TABLET BY MOUTH TWICE DAILY AS NEEDED FOR ANXIETY / SLEEP.   losartan (COZAAR) 25 MG tablet Take 1 tablet (25 mg total) by mouth daily.   nitroGLYCERIN (NITROSTAT) 0.4 MG SL  tablet Place 1 tablet (0.4 mg total) under the tongue every 5 (five) minutes as needed for chest pain (Call 911 at 3rd dose within 15 minutes.).   NON FORMULARY Vitamin c, vitamin d   Polyethyl Glycol-Propyl Glycol (LUBRICANT EYE DROPS) 0.4-0.3 % SOLN Place 1-2 drops into both eyes at bedtime.   polyethylene glycol (MIRALAX / GLYCOLAX) 17 g packet Take 17 g by mouth as needed for mild constipation or moderate constipation.   sodium chloride (OCEAN) 0.65 % SOLN nasal spray Place 1 spray into both nostrils at bedtime as needed for congestion.   terconazole (TERAZOL 7) 0.4 % vaginal cream Place 1 applicator vaginally at bedtime. One applicator full QHS for seven days of therapy      Review of Systems:   Please see the history of present illness.    All other systems reviewed and are negative.      EKGs/Labs/Other Test Reviewed:   EKG: October 2024 atrial fibrillation with PVCs and parent complexes  EKG Interpretation Date/Time:    Ventricular Rate:    PR Interval:    QRS Duration:    QT Interval:    QTC Calculation:   R Axis:      Text Interpretation:            Risk Assessment/Calculations:    CHA2DS2-VASc Score = 6   This indicates a 9.7% annual risk of stroke. The patient's score is based upon: CHF History: 1 HTN History: 1 Diabetes History: 0 Stroke History: 0 Vascular Disease History: 1 Age Score: 2 Gender Score: 1         Physical Exam:   VS:  BP 118/74 (BP Location: Left Arm, Patient Position: Sitting, Cuff Size: Normal)   Pulse 75   Resp 16   Ht 5\' 4"  (1.626 m)   Wt 145 lb 12.8 oz (66.1 kg)   SpO2 99%   BMI 25.03 kg/m        Wt Readings from Last 3 Encounters:  12/11/23 145 lb 12.8 oz (66.1 kg)  12/10/23 141 lb (64 kg)  11/22/23 141 lb (64 kg)      GENERAL:  No apparent distress, AOx3 HEENT:  No carotid bruits, +2 carotid impulses, no scleral icterus CAR: Irregular RR no murmurs, gallops, rubs, or thrills RES:  Clear to auscultation  bilaterally ABD:  Soft, nontender, nondistended, positive bowel sounds x 4 VASC:  +2 radial pulses, +2 carotid pulses NEURO:  CN 2-12 grossly intact; motor and sensory grossly intact PSYCH:  No active depression or anxiety EXT:  No edema, ecchymosis, or cyanosis  Signed, Orbie Pyo, MD  12/11/2023 7:29 PM    [facility] [address] [place], [place], [place]  [address] Phone: [phone]; Fax: [phone]   Note:  This document was prepared using Dragon voice recognition software and may include unintentional dictation errors.

## 2023-12-12 ENCOUNTER — Ambulatory Visit: Admitting: Physical Medicine and Rehabilitation

## 2023-12-12 ENCOUNTER — Other Ambulatory Visit: Payer: Self-pay

## 2023-12-12 DIAGNOSIS — M25551 Pain in right hip: Secondary | ICD-10-CM

## 2023-12-12 DIAGNOSIS — M7061 Trochanteric bursitis, right hip: Secondary | ICD-10-CM

## 2023-12-12 LAB — SURESWAB HSV, TYPE 1/2 DNA, PCR
HSV 1 DNA: DETECTED — AB
HSV 2 DNA: NOT DETECTED

## 2023-12-12 NOTE — Progress Notes (Signed)
 Pain Scale   Average Pain 6        +Driver, -BT, -Dye Allergies.

## 2023-12-12 NOTE — Progress Notes (Signed)
 [name] - 89 y.o. female MRN [id number]  Date of birth: [birth year]  Office Visit Note: Visit Date: 12/12/2023 PCP: Tresa Garter, MD Referred by: Tresa Garter, MD  Subjective: Chief Complaint  Patient presents with   Right Hip - Pain   HPI:  Hailey [name] is a 89 y.o. female who comes in today at the request of Ellin Goodie, FNP for planned Right anesthetic greater trochanteric injection with fluoroscopic guidance.  The patient has failed conservative care including home exercise, medications, time and activity modification.  This injection will be diagnostic and hopefully therapeutic.  Please see requesting physician notes for further details and justification.   ROS Otherwise per HPI.  Assessment & Plan: Visit Diagnoses:    ICD-10-CM   1. Greater trochanteric bursitis, right  M70.61 Large Joint Inj: R greater trochanter    XR C-ARM NO REPORT    2. Pain in right hip  M25.551 Large Joint Inj: R greater trochanter    XR C-ARM NO REPORT      Plan: No additional findings.   Meds & Orders: No orders of the defined types were placed in this encounter.   Orders Placed This Encounter  Procedures   Large Joint Inj: R greater trochanter   XR C-ARM NO REPORT    Follow-up: Return if symptoms worsen or fail to improve.   Procedures: Large Joint Inj: R greater trochanter on 12/12/2023 3:55 PM Indications: pain and diagnostic evaluation Details: 22 G 3.5 in needle, fluoroscopy-guided lateral approach  Arthrogram: No  Medications: 4 mL lidocaine 2 %; 4 mL bupivacaine 0.25 %; 40 mg triamcinolone acetonide 40 MG/ML Outcome: tolerated well, no immediate complications  There was excellent flow of contrast outlined the greater trochanteric bursa without vascular uptake. Procedure, treatment alternatives, risks and benefits explained, specific risks discussed. Consent was given by the patient. Immediately prior to procedure a time out was called to verify the  correct patient, procedure, equipment, support staff and site/side marked as required. Patient was prepped and draped in the usual sterile fashion.          Clinical History: EXAM: MRI LUMBAR SPINE WITHOUT CONTRAST   TECHNIQUE: Multiplanar, multisequence MR imaging of the lumbar spine was performed. No intravenous contrast was administered.   COMPARISON:  MR lumbar 03/20/2017; X-ray lumbar 09/14/2021.   FINDINGS: Segmentation:  Standard.   Alignment: Scoliotic thoracolumbar curvature. Mild retrolisthesis at L1-2, L2-3, and L3-4.   Vertebrae: No fracture, evidence of discitis, or suspicious bone lesion. Scattered intraosseous hemangiomas.   Conus medullaris and cauda equina: Conus extends to the L1 level. Conus and cauda equina appear normal.   Paraspinal and other soft tissues: No acute findings.   Disc levels:   T12-L1: Mild disc bulge. No foraminal or canal stenosis. Unchanged.   L1-L2: Disc height loss with mild disc bulge and endplate ridging. Mild left foraminal stenosis. No canal stenosis. Unchanged.   L2-L3: Retrolisthesis. Diffuse disc bulge with small caudally extending right paracentral disc extrusion. Mild bilateral facet arthropathy. Mild canal stenosis with moderate bilateral foraminal stenosis. Findings have progressed from prior.   L3-L4: Diffuse disc bulge with mild bilateral facet arthropathy and ligamentum flavum buckling. Findings result in mild-moderate canal stenosis with moderate right foraminal stenosis. Findings progressed from prior.   L4-L5: Disc bulge with endplate osteophytic ridging. Advanced bilateral facet arthropathy. Severe right and mild left foraminal stenosis. No significant interval progression.   L5-S1: Disc height loss and endplate ridging. Mild facet hypertrophy. No canal stenosis.  Mild-to-moderate left and mild right foraminal stenosis. Unchanged.   IMPRESSION: 1. Multilevel lumbar spondylosis, slightly progressed from  prior MRI. 2. Mild-moderate canal stenosis with moderate right foraminal stenosis at L3-4. 3. Mild canal stenosis and moderate bilateral foraminal stenosis at L2-3. 4. Severe right and mild left foraminal stenosis at L4-5.     Electronically Signed   By: Duanne Guess D.O.   On: 11/29/2021 13:23     Objective:  VS:  HT:    WT:   BMI:     BP:   HR: bpm  TEMP: ( )  RESP:  Physical Exam   Imaging: No results found.

## 2023-12-16 ENCOUNTER — Ambulatory Visit: Payer: Medicare Other | Admitting: Internal Medicine

## 2023-12-16 ENCOUNTER — Encounter: Payer: Self-pay | Admitting: Obstetrics and Gynecology

## 2023-12-17 NOTE — Telephone Encounter (Signed)
 Spoke with patient.   OV scheduled for 4/10 at 0930 with Dr. Edward Jolly.   Routing to provider for final review. Patient is agreeable to disposition. Will close encounter.

## 2023-12-18 ENCOUNTER — Other Ambulatory Visit: Payer: Self-pay | Admitting: Physician Assistant

## 2023-12-18 MED ORDER — BUPIVACAINE HCL 0.25 % IJ SOLN
4.0000 mL | INTRAMUSCULAR | Status: AC | PRN
  Administered 2023-12-12: 4 mL via INTRA_ARTICULAR

## 2023-12-18 MED ORDER — TRIAMCINOLONE ACETONIDE 40 MG/ML IJ SUSP
40.0000 mg | INTRAMUSCULAR | Status: AC | PRN
  Administered 2023-12-12: 40 mg via INTRA_ARTICULAR

## 2023-12-18 MED ORDER — LIDOCAINE HCL 2 % IJ SOLN
4.0000 mL | INTRAMUSCULAR | Status: AC | PRN
  Administered 2023-12-12: 4 mL

## 2023-12-18 NOTE — Progress Notes (Unsigned)
 GYNECOLOGY  VISIT   HPI: 89 y.o.   Married  Caucasian female   G2P2000 with No LMP recorded. Patient has had a hysterectomy.   here for: discuss results.   Seen for evaluation and treatment of vulvitis on 12/10/23.  She was dx with yeast infection and was treated with Terazol, which gave her relief.  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   She denies a hx of cold sores.   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   Not sexually active.   Her back is sore today after doing a workout at the gym yesterday.   GYNECOLOGIC HISTORY: No LMP recorded. Patient has had a hysterectomy. Contraception:  hyst Menopausal hormone therapy:  n/a Last 2 paps:  many years ago History of abnormal Pap or positive HPV:  no Mammogram:  05/30/23 Breast Density Cat C, BI-RADS CAT 1 neg         OB History     Gravida  2   Para  2   Term  2   Preterm  0   AB  0   Living         SAB  0   IAB  0   Ectopic  0   Multiple      Live Births                 Patient Active Problem List   Diagnosis Date Noted   Clostridioides difficile infection 10/22/2023   Supraclavicular fossa fullness 09/24/2023   COVID-19 09/24/2023   Cough 06/28/2023   Degenerative tear of left medial meniscus 06/20/2023   History of skin cancer 05/20/2023   Melanocytic nevi of trunk 05/20/2023   Seborrheic keratoses 05/20/2023   Squamous cell carcinoma of skin of right lower extremity 05/20/2023   Mitral regurgitation 02/22/2023   Aneurysm of ascending aorta without rupture (HCC) 02/18/2023   Cellulitis of left leg 02/18/2023   Closed comminuted fracture of left humerus 02/07/2023   Bradycardia 02/07/2023   Fall 02/07/2023   Chronic a-fib (HCC) 02/07/2023   Statin intolerance 12/31/2022   Acute  pain of right knee 12/06/2022   Impetigo 10/01/2022   Insomnia 10/01/2022   Osteoporosis 06/11/2022   Weakness 04/25/2022   Weakness generalized 04/25/2022   Falls frequently 04/19/2022   Iron deficiency anemia 12/08/2021   Skin ulcer, limited to breakdown of skin (HCC) 11/22/2021   Anemia, iron deficiency 11/22/2021   Low serum vitamin B12 11/22/2021   Acute renal failure superimposed on stage 2 chronic kidney disease (HCC) 11/20/2021   ABLA (acute blood loss anemia) 11/20/2021   Hypotension 11/19/2021   Shortness of breath 06/12/2021   Right leg pain 02/21/2021   C. difficile colitis    Abnormal CT of the abdomen    Aortic atherosclerosis (HCC) 08/27/2020   Acute prerenal azotemia 08/27/2020   Shock circulatory (HCC) 08/27/2020   Metabolic acidosis with normal anion gap and bicarbonate losses 08/27/2020   Abdominal cramps 03/16/2020   Neurogenic orthostatic hypotension (HCC) 09/16/2019   Greater trochanteric pain syndrome of right lower extremity 07/30/2019   Abdominal pain 05/22/2018   Bradycardia with 41-50 beats per minute 12/27/2017   Right bundle branch block (RBBB) on electrocardiogram (ECG) 12/27/2017   Chronic kidney disease,  stage 3b (HCC) 12/24/2017   Cellulitis of leg, right with large prepatella hematoma and open wounds 12/24/2017   Iliotibial band syndrome of right side 12/16/2017   Intervertebral lumbar disc disorder with myelopathy, lumbar region 04/25/2017   Family history of colon cancer in mother 01/22/2017   Primary osteoarthritis of both first carpometacarpal joints 01/02/2017   Pain 11/21/2016   Gastroenteritis 09/13/2016   Chronic anticoagulation 05/24/2015   Coronary artery disease involving native coronary artery of native heart with angina pectoris (HCC) 12/20/2014   Permanent atrial fibrillation (HCC) 11/21/2012   Greater trochanteric bursitis of both hips 05/21/2012   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   Bruising 12/18/2010   Osteoarthritis of  left hip 07/03/2010   DEGENERATIVE DISC DISEASE, LUMBOSACRAL SPINE 05/18/2010   SYNCOPE 10/27/2008   Anxiety 05/21/2008   IBS 05/21/2008   Essential hypertension 06/16/2007   GERD (gastroesophageal reflux disease) 06/16/2007    Past Medical History:  Diagnosis Date   Acute blood loss anemia 12/25/2017   Allergic rhinitis    Anxiety    Arthritis    "my whole spine" (07/01/2017)   Arthritis    Atrial fibrillation (HCC)    Bowel obstruction (HCC)    in [place]   Bradycardia with 41-50 beats per minute 12/27/2017   Cancer (HCC)    Cellulitis of leg, right with large prepatella hematoma and open wounds 12/26/2017   CKD (chronic kidney disease) stage 2, GFR 60-89 ml/min    Colon polyps    Coronary artery disease    10/18 PCI/DES to p/m LCx with cutting balloon to mLcx   Diverticulosis of colon    GERD (gastroesophageal reflux disease)    Hip bursitis 2010   Dr Wyline Mood, Post op seroma   History of colon polyps    HSV (herpes simplex virus) anogenital infection 07/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   HTN (hypertension)    IBS (irritable bowel syndrome)    constipation predominant - Dr Kinnie Scales   Lichen sclerosus    Osteopenia 11/2016   T score -2.0 FRAX 15%/4.3%   PAC (premature atrial contraction)    Symptomatiic   Renal insufficiency    Right bundle branch block (RBBB) on electrocardiogram (ECG) 12/27/2017   Scoliosis    SVT (supraventricular tachycardia) (HCC)    brief history   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   biopsy showing vulvar atypia, possible VIN I    Past Surgical History:  Procedure Laterality Date   ANTERIOR AND POSTERIOR VAGINAL REPAIR  01/2002   Hattie Perch 01/23/2011   APPENDECTOMY  1946   CARDIAC CATHETERIZATION  06/26/2017   CORONARY ANGIOPLASTY WITH STENT PLACEMENT  07/01/2017   CORONARY ATHERECTOMY N/A 07/01/2017   Procedure: CORONARY ATHERECTOMY;  Surgeon: Lyn Records, MD;  Location: [facility] INVASIVE CV [facility];  Service: Cardiovascular;   Laterality: N/A;   CORONARY STENT INTERVENTION N/A 07/01/2017   Procedure: CORONARY STENT INTERVENTION;  Surgeon: Lyn Records, MD;  Location: [facility];  Service: Cardiovascular;  Laterality: N/A;   HAMMER TOE SURGERY     HEMORRHOID BANDING     HIP SURGERY Left 04/2009   hip examination under anesthesia followed by greater trochanteric bursectomy; iliotibial band tenotomy/notes 01/20/2011   I & D EXTREMITY Right 01/10/2018   Procedure: IRRIGATION AND DEBRIDEMENT RIGHT KNEE, APPLY WOUND VAC;  Surgeon: Nadara Mustard, MD;  Location: [facility] OR;  Service: Orthopedics;  Laterality: Right;   KNEE BURSECTOMY Right 04/2009   Hattie Perch 01/09/2011  LEFT ATRIAL APPENDAGE OCCLUSION N/A 08/24/2021   Procedure: LEFT ATRIAL APPENDAGE OCCLUSION;  Surgeon: Tonny Bollman, MD;  Location: [facility] INVASIVE CV LAB;  Service: Cardiovascular;  Laterality: N/A;   LEFT HEART CATH AND CORONARY ANGIOGRAPHY N/A 06/26/2017   Procedure: LEFT HEART CATH AND CORONARY ANGIOGRAPHY;  Surgeon: Lyn Records, MD;  Location: [facility];  Service: Cardiovascular;  Laterality: N/A;   PUBOVAGINAL SLING  01/2002   Hattie Perch 01/23/2011   REDUCTION MAMMAPLASTY     TEE WITHOUT CARDIOVERSION N/A 08/24/2021   Procedure: TRANSESOPHAGEAL ECHOCARDIOGRAM (TEE);  Surgeon: Tonny Bollman, MD;  Location: [facility] INVASIVE CV LAB;  Service: Cardiovascular;  Laterality: N/A;   TEMPORARY PACEMAKER N/A 07/01/2017   Procedure: TEMPORARY PACEMAKER;  Surgeon: Lyn Records, MD;  Location: [facility] INVASIVE CV LAB;  Service: Cardiovascular;  Laterality: N/A;   VAGINAL HYSTERECTOMY  01/2002   Vaginal hysterectomy, bilateral salpingo-oophorectomy/notes 01/23/2011    Current Outpatient Medications  Medication Sig Dispense Refill   acetaminophen (TYLENOL) 500 MG tablet Take 1,000 mg by mouth every 6 (six) hours as needed (body aches / sore hip). Maximum of 6 tablets daily (3000mg )     BIOTIN PO Take 5,000 mcg by mouth in the morning.     clobetasol ointment  (TEMOVATE) 0.05 % Apply to the affected area in a thin layer twice a day for 2 weeks as needed for a flare.  Then apply to the area twice a week at bedtime as maintenance dosing. 60 g 1   diclofenac Sodium (VOLTAREN) 1 % GEL Apply 2 g topically 4 (four) times daily as needed (back pain.).     dicyclomine (BENTYL) 20 MG tablet Take 1 by mouth every 4-6 hours as needed for cramping 30 tablet 6   ELIQUIS 2.5 MG TABS tablet TAKE 1 TABLET BY MOUTH TWICE  DAILY 200 tablet 2   empagliflozin (JARDIANCE) 10 MG TABS tablet Take 1 tablet (10 mg total) by mouth daily before breakfast. 90 tablet 2   escitalopram (LEXAPRO) 10 MG tablet Take 1 tablet (10 mg total) by mouth daily. 30 tablet 11   esomeprazole (NEXIUM) 40 MG capsule TAKE ONE CAPSULE ONCE DAILY 90 capsule 1   Homeopathic Products (ARNICARE) GEL Apply 1 application  topically daily as needed (skin bruising).     hyoscyamine (LEVSIN) 0.125 MG tablet TAKE 1-2 TABLETS EVERY 4 HOURS AS NEEDED FOR UP TO 10 DAYS FOR CRAMPING. 100 tablet 3   LORazepam (ATIVAN) 1 MG tablet TAKE ONE TABLET BY MOUTH TWICE DAILY AS NEEDED FOR ANXIETY / SLEEP. 180 tablet 1   losartan (COZAAR) 25 MG tablet Take 1 tablet (25 mg total) by mouth daily. 90 tablet 3   nitroGLYCERIN (NITROSTAT) 0.4 MG SL tablet Place 1 tablet (0.4 mg total) under the tongue every 5 (five) minutes as needed for chest pain (Call 911 at 3rd dose within 15 minutes.). 20 tablet 3   NON FORMULARY Vitamin c, vitamin d     Polyethyl Glycol-Propyl Glycol (LUBRICANT EYE DROPS) 0.4-0.3 % SOLN Place 1-2 drops into both eyes at bedtime.     polyethylene glycol (MIRALAX / GLYCOLAX) 17 g packet Take 17 g by mouth as needed for mild constipation or moderate constipation.     sodium chloride (OCEAN) 0.65 % SOLN nasal spray Place 1 spray into both nostrils at bedtime as needed for congestion.     terconazole (TERAZOL 7) 0.4 % vaginal cream Place 1 applicator vaginally at bedtime. One applicator full QHS for seven days of  therapy  45 g 0   valACYclovir (VALTREX) 500 MG tablet Take 1 tablet (500 mg total) by mouth daily. Take one tablet by mouth daily for 3 days for an outbreak 30 tablet 1   No current facility-administered medications for this visit.     ALLERGIES: Macrobid [nitrofurantoin monohyd macro], Meloxicam, Digoxin and related, Diprolene [betamethasone dipropionate aug], Ferrous sulfate, Keflex [cephalexin], Lipitor [atorvastatin], Mobic [meloxicam], and Sulfamethoxazole-trimethoprim  Family History  Problem Relation Age of Onset   Colon cancer Mother        Dx age 19, died at age 88   Diabetes Father    Prostate cancer Father    Prostate cancer Brother    Pancreatic cancer Brother    Stomach cancer Son     Social History   Socioeconomic History   Marital status: Married    Spouse name: [name]   Number of children: 2   Years of education: Not on file   Highest education level: Bachelor's degree (e.g., BA, AB, BS)  Occupational History   Occupation: Engineer, manufacturing systems: RETIRED  Tobacco Use   Smoking status: Never   Smokeless tobacco: Never  Vaping Use   Vaping status: Never Used  Substance and Sexual Activity   Alcohol use: Yes    Comment: moderatley [place] before dinner   Drug use: Never   Sexual activity: Not Currently    Birth control/protection: Surgical    Comment: hysterectomy; less than 5, IC after 18, no STD, no abnormal paps  Other Topics Concern   Not on file  Social History Narrative   ** Merged History Encounter ** Regular Exercise -  YES      Lives at home with husband/2025   Social Drivers of Health   Financial Resource Strain: Low Risk  (11/22/2023)   Overall Financial Resource Strain (CARDIA)    Difficulty of Paying Living Expenses: Not hard at all  Food Insecurity: No Food Insecurity (11/22/2023)   Hunger Vital Sign    Worried About Running Out of Food in the Last Year: Never true    Ran Out of Food in the Last Year: Never true  Transportation Needs:  No Transportation Needs (11/22/2023)   PRAPARE - Administrator, Civil Service (Medical): No    Lack of Transportation (Non-Medical): No  Physical Activity: Insufficiently Active (11/22/2023)   Exercise Vital Sign    Days of Exercise per Week: 2 days    Minutes of Exercise per Session: 60 min  Stress: No Stress Concern Present (11/22/2023)   [employer] of Occupational Health - Occupational Stress Questionnaire    Feeling of Stress : Only a little  Social Connections: Moderately Isolated (11/22/2023)   Social Connection and Isolation Panel [NHANES]    Frequency of Communication with Friends and Family: More than three times a week    Frequency of Social Gatherings with Friends and Family: Three times a week    Attends Religious Services: Never    Active Member of Clubs or Organizations: No    Attends Banker Meetings: Never    Marital Status: Married  Catering manager Violence: Not At Risk (11/22/2023)   Humiliation, Afraid, Rape, and Kick questionnaire    Fear of Current or Ex-Partner: No    Emotionally Abused: No    Physically Abused: No    Sexually Abused: No    Review of Systems  All other systems reviewed and are negative.   PHYSICAL EXAMINATION:   BP 126/82 (BP Location: Right  Arm, Patient Position: Sitting, Cuff Size: Small)   Ht 5\' 4"  (1.626 m)   Wt 141 lb (64 kg)   BMI 24.20 kg/m     General appearance: alert, cooperative and appears stated age  ASSESSMENT:  HSV I. This is not a new diagnosis on chart review. Lichen sclerosus of the vulva.  Recent yeast vulvovaginitis.  GFR 45.88 on 10/11/23.  Factor XI deficiency.   PLAN:  We discussed HSV infection and differentiated HSV 1 and HSV 2.   We reviewed route of transmission, signs/symptoms of infection, the recurrent nature, lack of cure, and treatment options.  Antiviral prescription therapy can be used with each outbreak to reduce the length of time that symptoms occur or as a  suppression regimen for reducing frequency of outbreaks.  Rx for Valtrex 500 mg po daily x 3 days for an outbreak.  #30, RF one.  Fu prn.   35 min  total time was spent for this patient encounter, including preparation, face-to-face counseling with the patient, coordination of care, and documentation of the encounter.

## 2023-12-19 ENCOUNTER — Encounter: Payer: Self-pay | Admitting: Obstetrics and Gynecology

## 2023-12-19 ENCOUNTER — Ambulatory Visit: Admitting: Obstetrics and Gynecology

## 2023-12-19 VITALS — BP 126/82 | Ht 64.0 in | Wt 141.0 lb

## 2023-12-19 DIAGNOSIS — B009 Herpesviral infection, unspecified: Secondary | ICD-10-CM | POA: Diagnosis not present

## 2023-12-19 MED ORDER — VALACYCLOVIR HCL 500 MG PO TABS: 500.0000 mg | ORAL_TABLET | Freq: Every day | ORAL | 1 refills | Status: DC

## 2023-12-19 NOTE — Patient Instructions (Signed)
 Genital Herpes Genital herpes is a common sexually transmitted infection (STI) that is caused by a virus. The virus spreads from person to person through contact with a sore, infected saliva, or infected skin. The virus can cause itching, blisters, and sores around the genitals or rectum. During an outbreak of infection, symptoms may last for several days and then go away. However, the virus remains in the body, so more outbreaks may happen in the future. The time between outbreaks varies and can be from months to years. Genital herpes can affect anyone. It is particularly concerning for pregnant women because the virus can be passed to the baby during delivery. Genital herpes is also a concern for people who have a weak disease-fighting system (immune system). What are the causes? This condition is caused by the herpes simplex virus, type 1 or type 2 (HSV-1 or HSV-2). The virus may spread through: Sexual contact with an infected person, including vaginal, anal, and oral sex. Contact with a herpes sore. The skin. This means that you can get herpes from an infected partner even if there are no blisters or sores present. Your partner may not know that he or she is infected. What increases the risk? You are more likely to develop this condition if: You have sex with many partners. You do not use latex or polyurethane condoms during sex. What are the signs or symptoms? Most people do not have symptoms or they have mild symptoms that may be mistaken for other skin problems. Symptoms may include: Small, red bumps near the genitals, rectum, or mouth. These bumps turn into blisters and then sores. Flu-like (influenza-like) symptoms, including: Fever. Body aches. Swollen lymph nodes. Headache. Painful urination. Pain and itching in the genital area or rectal area. Vaginal discharge. Tingling or shooting pain in the legs and buttocks. Generally, symptoms are more severe and last longer during the first  (primary) outbreak. Influenza-like symptoms are also more common during the primary outbreak. How is this diagnosed? This condition may be diagnosed based on: A physical exam. Your medical history. Blood tests. A test of a fluid sample (culture) from an open sore. How is this treated? There is no cure for this condition, but treatment with antiviral medicines can do the following: Speed up healing and relieve symptoms. Help to reduce the spread of the virus to sexual partners. Limit the chance of future outbreaks, or make future outbreaks shorter. Lessen symptoms of future outbreaks. Your health care provider may also recommend over-the-counter medicines to help with pain and itching. Follow these instructions at home: If you have an outbreak:  Keep the affected areas dry and clean. Avoid rubbing or touching blisters and sores. If you do touch blisters or sores: Wash your hands thoroughly with soap and water for at least 20 seconds. If soap and water are not available, use an alcohol-based hand sanitizer. Do not touch your eyes afterward. Sexual activity Do not have sexual contact during active outbreaks. Practice safe sex. Herpes can spread even if your partner does not have blisters or sores. Latex or polyurethane condoms and female condoms may help prevent the spread of the herpes virus. Managing pain and discomfort If directed, put ice on the painful area. To do this: Put ice in a plastic bag. Place a towel between your skin and the bag. Leave the ice on for 20 minutes, 2-3 times a day. Remove the ice if your skin turns bright red. This is very important. If you cannot feel pain, heat, or  cold, you have a greater risk of damage to the area. If told, take a cool sitz bath to help relieve pain or itching. A sitz bath is a water bath that you take while sitting down in water that is deep enough to cover your hips and buttocks. General instructions Take over-the-counter and  prescription medicines only as told by your health care provider. If you were prescribed an antiviral medicine, use it as told by your health care provider. Do not stop using the antiviral even if you start to feel better. Keep all follow-up visits. This is important. How is this prevented? Use condoms. Although you can get genital herpes during sexual contact even with the use of a condom, a condom can provide some protection. Avoid having multiple sexual partners. Talk with your sexual partner about any symptoms either of you may have. Also, talk with your partner about any history of STIs. Do not have sexual contact if you have active symptoms of genital herpes. Contact a health care provider if: Your symptoms are not improving with medicine. Your symptoms return, or you have new symptoms. You have a fever. You have abdominal pain. You have redness, swelling, or pain in your eye. You notice new sores on other parts of your body. You have had herpes and you become pregnant or plan to become pregnant. Get help right away if: You have symptoms of viral meningitis. This is rare but may happen if the virus spreads to the brain. Symptoms may include: Severe headache or stiff neck. Muscle aches. Nausea and vomiting. Sensitivity to light. Summary Genital herpes is a common sexually transmitted infection (STI) that is caused by the herpes simplex virus, type 1 or type 2 (HSV-1 or HSV-2). These viruses are most often spread through sexual contact with an infected person. You are more likely to develop this condition if you have sex with many partners or you do not use condoms during sex. Most people do not have symptoms or have mild symptoms that may be mistaken for other skin problems. Symptoms occur as outbreaks that may happen months or years apart. There is no cure for this condition, but treatment with oral antiviral medicines can reduce symptoms, reduce the chance of spreading the virus to  a partner, prevent future outbreaks, or shorten future outbreaks. This information is not intended to replace advice given to you by your health care provider. Make sure you discuss any questions you have with your health care provider. Document Revised: 06/01/2021 Document Reviewed: 06/01/2021 Elsevier Patient Education  2024 [employer].

## 2023-12-23 ENCOUNTER — Ambulatory Visit: Payer: Medicare Other | Admitting: Internal Medicine

## 2023-12-25 ENCOUNTER — Telehealth: Payer: Self-pay

## 2023-12-25 NOTE — Telephone Encounter (Signed)
 Patient called to state that last injection on 4/325, greater trochanter, did not help. She is still having pain.

## 2023-12-26 ENCOUNTER — Other Ambulatory Visit: Payer: Self-pay | Admitting: Physical Medicine and Rehabilitation

## 2023-12-26 DIAGNOSIS — M5416 Radiculopathy, lumbar region: Secondary | ICD-10-CM

## 2023-12-29 ENCOUNTER — Other Ambulatory Visit: Payer: Self-pay | Admitting: Internal Medicine

## 2024-01-07 ENCOUNTER — Other Ambulatory Visit: Payer: Self-pay | Admitting: Physician Assistant

## 2024-01-14 ENCOUNTER — Encounter: Payer: Self-pay | Admitting: Physical Therapy

## 2024-01-14 ENCOUNTER — Ambulatory Visit (INDEPENDENT_AMBULATORY_CARE_PROVIDER_SITE_OTHER): Admitting: Physical Therapy

## 2024-01-14 DIAGNOSIS — M6281 Muscle weakness (generalized): Secondary | ICD-10-CM | POA: Diagnosis not present

## 2024-01-14 DIAGNOSIS — G8929 Other chronic pain: Secondary | ICD-10-CM

## 2024-01-14 DIAGNOSIS — M25561 Pain in right knee: Secondary | ICD-10-CM

## 2024-01-14 DIAGNOSIS — M5459 Other low back pain: Secondary | ICD-10-CM

## 2024-01-14 DIAGNOSIS — R262 Difficulty in walking, not elsewhere classified: Secondary | ICD-10-CM | POA: Diagnosis not present

## 2024-01-14 DIAGNOSIS — M25551 Pain in right hip: Secondary | ICD-10-CM

## 2024-01-14 NOTE — Therapy (Signed)
 a OUTPATIENT PHYSICAL THERAPY THORACOLUMBAR EVALUATION   Patient Name: Hailey [name] MRN: [id number] DOB:1935/03/08, 89 y.o., female Today's Date: 01/14/2024  END OF SESSION:  PT End of Session - 01/14/24 0959     Visit Number 1    Number of Visits 20    Date for PT Re-Evaluation 03/24/24    Authorization Type UHC/Medicare    Progress Note Due on Visit 10    PT Start Time 1005    PT Stop Time 1045    PT Time Calculation (min) 40 min    Activity Tolerance Patient tolerated treatment well    Behavior During Therapy [facility] for tasks assessed/performed             Past Medical History:  Diagnosis Date   Acute blood loss anemia 12/25/2017   Allergic rhinitis    Anxiety    Arthritis    "my whole spine" (07/01/2017)   Arthritis    Atrial fibrillation (HCC)    Bowel obstruction (HCC)    in [place]    Bradycardia with 41-50 beats per minute 12/27/2017   Cancer (HCC)    Cellulitis of leg, right with large prepatella hematoma and open wounds 12/26/2017   CKD (chronic kidney disease) stage 2, GFR 60-89 ml/min    Colon polyps    Coronary artery disease    10/18 PCI/DES to p/m LCx with cutting balloon to mLcx   Diverticulosis of colon    GERD (gastroesophageal reflux disease)    Hip bursitis 2010   Dr Watson Hacking, Post op seroma   History of colon polyps    HSV (herpes simplex virus) anogenital infection 07/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   HTN (hypertension)    IBS (irritable bowel syndrome)    constipation predominant - Dr Andriette Keeling   Lichen sclerosus    Osteopenia 11/2016   T score -2.0 FRAX 15%/4.3%   PAC (premature atrial contraction)    Symptomatiic   Renal insufficiency    Right bundle branch block (RBBB) on electrocardiogram (ECG) 12/27/2017   Scoliosis    SVT (supraventricular tachycardia) (HCC)    brief history   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   biopsy showing vulvar atypia, possible VIN I   Past Surgical History:  Procedure Laterality  Date   ANTERIOR AND POSTERIOR VAGINAL REPAIR  01/2002   Maximo Spar 01/23/2011   APPENDECTOMY  1948   CARDIAC CATHETERIZATION  06/26/2017   CORONARY ANGIOPLASTY WITH STENT PLACEMENT  07/01/2017   CORONARY ATHERECTOMY N/A 07/01/2017   Procedure: CORONARY ATHERECTOMY;  Surgeon: Arty Binning, MD;  Location: [facility] INVASIVE CV LAB;  Service: Cardiovascular;  Laterality: N/A;   CORONARY STENT INTERVENTION N/A 07/01/2017   Procedure: CORONARY STENT INTERVENTION;  Surgeon: Arty Binning, MD;  Location: [facility] INVASIVE CV LAB;  Service: Cardiovascular;  Laterality: N/A;   HAMMER TOE SURGERY     HEMORRHOID BANDING     HIP SURGERY Left 04/2009   hip examination under anesthesia followed by greater trochanteric bursectomy; iliotibial band tenotomy/notes 01/20/2011   I & D EXTREMITY Right 01/10/2018   Procedure: IRRIGATION AND DEBRIDEMENT RIGHT KNEE, APPLY WOUND VAC;  Surgeon: Timothy Ford, MD;  Location: [facility] OR;  Service: Orthopedics;  Laterality: Right;   KNEE BURSECTOMY Right 04/2009   Maximo Spar 01/09/2011   LEFT ATRIAL APPENDAGE OCCLUSION N/A 08/24/2021   Procedure: LEFT ATRIAL APPENDAGE OCCLUSION;  Surgeon: Arnoldo Lapping, MD;  Location: [facility] INVASIVE CV LAB;  Service: Cardiovascular;  Laterality: N/A;   LEFT  HEART CATH AND CORONARY ANGIOGRAPHY N/A 06/26/2017   Procedure: LEFT HEART CATH AND CORONARY ANGIOGRAPHY;  Surgeon: Arty Binning, MD;  Location: [facility] INVASIVE CV LAB;  Service: Cardiovascular;  Laterality: N/A;   PUBOVAGINAL SLING  01/2002   Maximo Spar 01/23/2011   REDUCTION MAMMAPLASTY     TEE WITHOUT CARDIOVERSION N/A 08/24/2021   Procedure: TRANSESOPHAGEAL ECHOCARDIOGRAM (TEE);  Surgeon: Arnoldo Lapping, MD;  Location: [facility] INVASIVE CV LAB;  Service: Cardiovascular;  Laterality: N/A;   TEMPORARY PACEMAKER N/A 07/01/2017   Procedure: TEMPORARY PACEMAKER;  Surgeon: Arty Binning, MD;  Location: [facility] INVASIVE CV LAB;  Service: Cardiovascular;  Laterality: N/A;   VAGINAL HYSTERECTOMY  01/2002   Vaginal hysterectomy,  bilateral salpingo-oophorectomy/notes 01/23/2011   Patient Active Problem List   Diagnosis Date Noted   Clostridioides difficile infection 10/22/2023   Supraclavicular fossa fullness 09/24/2023   COVID-19 09/24/2023   Cough 06/28/2023   Degenerative tear of left medial meniscus 06/20/2023   History of skin cancer 05/20/2023   Melanocytic nevi of trunk 05/20/2023   Seborrheic keratoses 05/20/2023   Squamous cell carcinoma of skin of right lower extremity 05/20/2023   Mitral regurgitation 02/22/2023   Aneurysm of ascending aorta without rupture (HCC) 02/18/2023   Cellulitis of left leg 02/18/2023   Closed comminuted fracture of left humerus 02/07/2023   Bradycardia 02/07/2023   Fall 02/07/2023   Chronic a-fib (HCC) 02/07/2023   Statin intolerance 12/31/2022   Acute pain of right knee 12/06/2022   Impetigo 10/01/2022   Insomnia 10/01/2022   Osteoporosis 06/11/2022   Weakness 04/25/2022   Weakness generalized 04/25/2022   Falls frequently 04/19/2022   Iron  deficiency anemia 12/08/2021   Skin ulcer, limited to breakdown of skin (HCC) 11/22/2021   Anemia, iron  deficiency 11/22/2021   Low serum vitamin B12 11/22/2021   Acute renal failure superimposed on stage 2 chronic kidney disease (HCC) 11/20/2021   ABLA (acute blood loss anemia) 11/20/2021   Hypotension 11/19/2021   Shortness of breath 06/12/2021   Right leg pain 02/21/2021   C. difficile colitis    Abnormal CT of the abdomen    Aortic atherosclerosis (HCC) 08/27/2020   Acute prerenal azotemia 08/27/2020   Shock circulatory (HCC) 08/27/2020   Metabolic acidosis with normal anion gap and bicarbonate losses 08/27/2020   Abdominal cramps 03/16/2020   Neurogenic orthostatic hypotension (HCC) 09/16/2019   Greater trochanteric pain syndrome of right lower extremity 07/30/2019   Abdominal pain 05/22/2018   Bradycardia with 41-50 beats per minute 12/27/2017   Right bundle branch block (RBBB) on electrocardiogram (ECG) 12/27/2017    Chronic kidney disease, stage 3b (HCC) 12/24/2017   Cellulitis of leg, right with large prepatella hematoma and open wounds 12/24/2017   Iliotibial band syndrome of right side 12/16/2017   Intervertebral lumbar disc disorder with myelopathy, lumbar region 04/25/2017   Family history of colon cancer in mother 01/22/2017   Primary osteoarthritis of both first carpometacarpal joints 01/02/2017   Pain 11/21/2016   Gastroenteritis 09/13/2016   Chronic anticoagulation 05/24/2015   Coronary artery disease involving native coronary artery of native heart with angina pectoris (HCC) 12/20/2014   Permanent atrial fibrillation (HCC) 11/21/2012   Greater trochanteric bursitis of both hips 05/21/2012   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   Bruising 12/18/2010   Osteoarthritis of left hip 07/03/2010   DEGENERATIVE DISC DISEASE, LUMBOSACRAL SPINE 05/18/2010   SYNCOPE 10/27/2008   Anxiety 05/21/2008   IBS 05/21/2008   Essential hypertension 06/16/2007   GERD (gastroesophageal reflux disease) 06/16/2007  PCP: Plotnikov, Oakley Bellman, MD   REFERRING PROVIDER: Darryll Eng, NP   REFERRING DIAG:  Diagnosis  M54.16 (ICD-10-CM) - Lumbar radiculopathy    Rationale for Evaluation and Treatment: Rehabilitation  THERAPY DIAG:  Other low back pain  Muscle weakness  Difficulty in walking, not elsewhere classified  Pain in right hip  Chronic pain of right knee  ONSET DATE: over a year c worsening   SUBJECTIVE:                                                                                                                                                                                           SUBJECTIVE STATEMENT: Pt arriving reporting worsening back pain over the last year where is limiting her ADL's and functional mobility. Pt lives with her 73 year old husband which recently had a fall and hurt his wrist. Pt reporting radiation pain down Rt LE into anterior thigh and knee.    PERTINENT HISTORY:  Bradycardia 41-50 beats per minute, CA, CKD, cellulitis of Rt leg with large pre-patella hematoma with open wounds 12/2017, hip bursitis 2010, HSV 2025, osteopenia, left hip surgery 2010, temporary pacemaker 2018, see other PMH above  PAIN:  NPRS scale: 5/10 Pain location: right sided low back, Rt hip, Rt knee Pain description: achy, sharp at times reaching 9-10/10 Aggravating factors: bending, extension, sit to stand Relieving factors: sitting  PRECAUTIONS: no  WEIGHT BEARING RESTRICTIONS: No  FALLS:  Has patient fallen in last 6 months? No  LIVING ENVIRONMENT: Lives with: lives with their family and lives with their spouse Lives in: House/apartment  Has following equipment at home: None  OCCUPATION: retired  PLOF: Independent  PATIENT GOALS: perform gym activities without pain  Next MD Visit:    OBJECTIVE:   DIAGNOSTIC FINDINGS:  No updated lumbar images found  PATIENT SURVEYS:  Patient-Specific Activity Scoring Scheme  "0" represents "unable to perform." "10" represents "able to perform at prior level. 0 1 2 3 4 5 6 7 8 9  10 (Date and Score)   Activity Eval  01/14/24    1. Take a walk  1     2. Stand for longer periods 3     3. Sit for longer periods  9   4.climb stairs 8   5.    Score 21/4=5.25    Total score = sum of the activity scores/number of activities Minimum detectable change (90%CI) for average score = 2 points Minimum detectable change (90%CI) for single activity score = 3 points  SCREENING FOR RED FLAGS: Bowel or bladder incontinence: No Cauda equina syndrome: No  COGNITION: Overall cognitive status: WFL normal  SENSATION: Numbness and tingling down Rt hip into Rt knee.    POSTURE:  forward head and decreased lumbar lordosis  PALPATION: TTP: lumbar paraspinals  LUMBAR ROM:   Directional Preference Assessment: Centralization: Peripheralization:   AROM eval  Flexion WFL  Extension 20 c pain   Right lateral flexion 28  Left lateral flexion 25  Right rotation 75%  Left rotation 50%   (Blank rows = not tested)  LOWER EXTREMITY ROM:       Right 01/14/24 Left 01/14/24  Hip flexion 108 110  Hip extension    Hip abduction    Hip adduction    Hip internal rotation    Hip external rotation    Knee flexion    Knee extension    Ankle dorsiflexion    Ankle plantarflexion    Ankle inversion    Ankle eversion     (Blank rows = not tested)  LOWER EXTREMITY MMT:    MMT Right Eval 01/14/24 sitting Left Eval 01/14/24 sitting  Hip flexion 19.8 27.5  Hip extension    Hip abduction    Hip adduction    Hip internal rotation    Hip external rotation    Knee flexion 22.6 17.8  Knee extension 16.5 20.6  Ankle dorsiflexion    Ankle plantarflexion    Ankle inversion    Ankle eversion     (Blank rows = not tested)  LUMBAR SPECIAL TESTS:  01/14/24 Slump test: Positive on the Rt  FUNCTIONAL TESTS: 01/14/24  Timed up and go (TUG): 9.98 seconds UE used to push off chair  GAIT: 01/14/24 Antalgic gait pattern in clinic gym, limp noted on Rt LE due to pain                                                                                                                                                                                                                   TODAY'S TREATMENT:  DATE:  01/14/24  Therex:    HEP instruction/performance c cues for techniques, handout provided.  Trial set performed of each for comprehension and symptom assessment.  See below for exercise list   PATIENT EDUCATION:  Education details: HEP, POC, discussed possibility of DN Person educated: Patient Education method: Explanation, Demonstration, Verbal cues, and Handouts Education comprehension: verbalized understanding, returned demonstration, and verbal cues required  HOME EXERCISE PROGRAM: Access  Code: [email or web address] URL: [email or web address] Date: 01/14/2024 Prepared by: Jerrel Mor  Exercises - Supine Posterior Pelvic Tilt  - 1-2 x daily - 7 x weekly - 1-2 sets - 10 reps - 5 seconds hold - Supine Piriformis Stretch with Foot on Ground  - 1-2 x daily - 7 x weekly - 4 reps - 20 seconds hold - Supine March with Posterior Pelvic Tilt  - 1-2 x daily - 7 x weekly - 2 sets - 10 reps - Supine Lower Trunk Rotation  - 1-2 x daily - 7 x weekly - 4 reps - 20 seconds hold - Hooklying Single Knee to Chest Stretch  - 1-2 x daily - 7 x weekly - 4 reps - 20 seconds hold  ASSESSMENT:  CLINICAL IMPRESSION: Patient is a 88 y.o. who comes to clinic with complaints of lumbar pain with mobility, strength and movement coordination deficits that impair their ability to perform usual daily and recreational functional activities without increase difficulty/symptoms at this time.  Patient to benefit from skilled PT services to address impairments and limitations to improve to previous level of function without restriction secondary to condition.   OBJECTIVE IMPAIRMENTS: difficulty walking, decreased ROM, decreased strength, and pain.   ACTIVITY LIMITATIONS: lifting, bending, sitting, standing, stairs, and transfers  PARTICIPATION LIMITATIONS: community activity  PERSONAL FACTORS: 3+ comorbidities: see PMH above  are also affecting patient's functional outcome.   REHAB POTENTIAL: Good  CLINICAL DECISION MAKING: Evolving/moderate complexity  EVALUATION COMPLEXITY: Moderate   GOALS: Goals reviewed with patient? Yes  SHORT TERM GOALS: (target date for Short term goals are 3 weeks 02/04/2024)  1. Patient will demonstrate independent use of home exercise program to maintain progress from in clinic treatments.  Goal status: New  LONG TERM GOALS: (target dates for all long term goals are 10 weeks  03/24/2024 )   1. Patient will demonstrate/report pain at worst less than or equal to 2/10  to facilitate minimal limitation in daily activity secondary to pain symptoms.  Goal status: New   2. Patient will demonstrate independent use of home exercise program to facilitate ability to maintain/progress functional gains from skilled physical therapy services.  Goal status: New   3. Patient will demonstrate Patient specific functional scale avg > or = 7.25 to indicate reduced disability due to condition.   Goal status: New   4. Patient will demonstrate lumbar extension with  pain </=3/10 WFL.   Goal status: New   5.  Pt will be able to navigate 1 flight of stairs with single hand rail reciprocal gait pattern safely with hip pain <= 3/10.  Goal status: New   6.  Pt will improve her bil LE strength by 5 ppsi in hip flexion, knee flexion and knee extension bilaterally.  Goal status: New     PLAN:  PT FREQUENCY: 1-2x/week  PT DURATION: 10 weeks  PLANNED INTERVENTIONS: Can include [id number]- PT Re-evaluation, 97110-Therapeutic exercises, 97530- Therapeutic activity, [id number]- Neuromuscular re-education, 97535- Self Care, 97140- Manual therapy, [id number]- Gait training, [phone]- Orthotic Fit/training, [phone]- Canalith repositioning, [id number]- Aquatic Therapy, [phone]- Electrical stimulation (  unattended), 97750 Physical performance testing, [id number]- Electrical stimulation (manual), 97016- Vasopneumatic device, [id number]- Ultrasound, [id number]- Traction (mechanical), [id number]- Ionotophoresis 4mg /ml Dexamethasone , Patient/Family education, Balance training, Stair training, Taping, Dry Needling, Joint mobilization, Joint manipulation, Spinal manipulation, Spinal mobilization, Scar mobilization, Vestibular training, Visual/preceptual remediation/compensation, DME instructions, Cryotherapy, and Moist heat.  All performed as medically necessary.  All included unless contraindicated  PLAN FOR NEXT SESSION: Review HEP knowledge/results. Core strengthening, manual as needed, posture work, consider DN   Marysue Sola,  PT, MPT 01/14/2024, 12:58 PM  Date of referral:   Referring provider: Darryll Eng, NP  Referring diagnosis? 12/26/23 Treatment diagnosis? (if different than referring diagnosis) M54.16 (ICD-10-CM) - Lumbar radiculopathy  What was this (referring dx) caused by? Ongoing Issue  Lonne Roan of Condition: Initial Onset (within last 3 months)   Laterality: Rt  Current Functional Measure Score: Other functional specific patient activity scale  Objective measurements identify impairments when they are compared to normal values, the uninvolved extremity, and prior level of function.  [x]  Yes  []  No  Objective assessment of functional ability: Minimal functional limitations   Briefly describe symptoms: pain in Rt side low back, radiating down Rt hip and anterior thigh, Pt also reporting Rt knee pain  How did symptoms start: on-going issue worsening over the last year  Average pain intensity:  Last 24 hours: 8/10 at times  Past week: 9/10 at times  How often does the pt experience symptoms? Frequently  How much have the symptoms interfered with usual daily activities? Moderately  How has condition changed since care began at this facility? NA - initial visit  In general, how is the patients overall health? Very Good   BACK PAIN (STarT Back Screening Tool) Has pain spread down the leg(s) at some time in the last 2 weeks? yes Has there been pain in the shoulder or neck at some time in the last 2 weeks? yes Has the pt only walked short distances because of back pain? yes Has patient dressed more slowly because of back pain in the past 2 weeks? no Does patient think it's not safe for a person with this condition to be physically active? no Does patient have worrying thoughts a lot of the time? Yes at times Does patient feel back pain is terrible and will never get any better? probably Has patient stopped enjoying things they usually enjoy? yes

## 2024-01-16 DIAGNOSIS — L810 Postinflammatory hyperpigmentation: Secondary | ICD-10-CM | POA: Diagnosis not present

## 2024-01-16 DIAGNOSIS — C44722 Squamous cell carcinoma of skin of right lower limb, including hip: Secondary | ICD-10-CM | POA: Diagnosis not present

## 2024-01-16 DIAGNOSIS — L308 Other specified dermatitis: Secondary | ICD-10-CM | POA: Diagnosis not present

## 2024-01-16 DIAGNOSIS — L578 Other skin changes due to chronic exposure to nonionizing radiation: Secondary | ICD-10-CM | POA: Diagnosis not present

## 2024-01-16 DIAGNOSIS — L570 Actinic keratosis: Secondary | ICD-10-CM | POA: Diagnosis not present

## 2024-01-16 DIAGNOSIS — D485 Neoplasm of uncertain behavior of skin: Secondary | ICD-10-CM | POA: Diagnosis not present

## 2024-01-16 DIAGNOSIS — D225 Melanocytic nevi of trunk: Secondary | ICD-10-CM | POA: Diagnosis not present

## 2024-01-16 DIAGNOSIS — I872 Venous insufficiency (chronic) (peripheral): Secondary | ICD-10-CM | POA: Diagnosis not present

## 2024-01-20 ENCOUNTER — Encounter: Payer: Self-pay | Admitting: Physical Therapy

## 2024-01-20 ENCOUNTER — Ambulatory Visit: Payer: Self-pay | Admitting: Physical Therapy

## 2024-01-20 DIAGNOSIS — R262 Difficulty in walking, not elsewhere classified: Secondary | ICD-10-CM | POA: Diagnosis not present

## 2024-01-20 DIAGNOSIS — M25561 Pain in right knee: Secondary | ICD-10-CM | POA: Diagnosis not present

## 2024-01-20 DIAGNOSIS — M5459 Other low back pain: Secondary | ICD-10-CM | POA: Diagnosis not present

## 2024-01-20 DIAGNOSIS — M25551 Pain in right hip: Secondary | ICD-10-CM | POA: Diagnosis not present

## 2024-01-20 DIAGNOSIS — G8929 Other chronic pain: Secondary | ICD-10-CM

## 2024-01-20 DIAGNOSIS — M6281 Muscle weakness (generalized): Secondary | ICD-10-CM

## 2024-01-20 NOTE — Therapy (Signed)
 a OUTPATIENT PHYSICAL THERAPY THORACOLUMBAR EVALUATION   Patient Name: Hailey [name] MRN: [id number] DOB:04/10/1935, 89 y.o., female Today's Date: 01/20/2024  END OF SESSION:  PT End of Session - 01/20/24 1235     Visit Number 2    Number of Visits 20    Date for PT Re-Evaluation 03/24/24    Authorization Type UHC/Medicare    Progress Note Due on Visit 10    PT Start Time 1150    PT Stop Time 1235    PT Time Calculation (min) 45 min    Activity Tolerance Patient tolerated treatment well    Behavior During Therapy [facility] for tasks assessed/performed              Past Medical History:  Diagnosis Date   Acute blood loss anemia 12/25/2017   Allergic rhinitis    Anxiety    Arthritis    "my whole spine" (07/01/2017)   Arthritis    Atrial fibrillation (HCC)    Bowel obstruction (HCC)    in [place]    Bradycardia with 41-50 beats per minute 12/27/2017   Cancer (HCC)    Cellulitis of leg, right with large prepatella hematoma and open wounds 12/26/2017   CKD (chronic kidney disease) stage 2, GFR 60-89 ml/min    Colon polyps    Coronary artery disease    10/18 PCI/DES to p/m LCx with cutting balloon to mLcx   Diverticulosis of colon    GERD (gastroesophageal reflux disease)    Hip bursitis 2010   Dr Watson Hacking, Post op seroma   History of colon polyps    HSV (herpes simplex virus) anogenital infection 07/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   HTN (hypertension)    IBS (irritable bowel syndrome)    constipation predominant - Dr Andriette Keeling   Lichen sclerosus    Osteopenia 11/2016   T score -2.0 FRAX 15%/4.3%   PAC (premature atrial contraction)    Symptomatiic   Renal insufficiency    Right bundle branch block (RBBB) on electrocardiogram (ECG) 12/27/2017   Scoliosis    SVT (supraventricular tachycardia) (HCC)    brief history   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   biopsy showing vulvar atypia, possible VIN I   Past Surgical History:  Procedure Laterality  Date   ANTERIOR AND POSTERIOR VAGINAL REPAIR  01/2002   Maximo Spar 01/23/2011   APPENDECTOMY  1948   CARDIAC CATHETERIZATION  06/26/2017   CORONARY ANGIOPLASTY WITH STENT PLACEMENT  07/01/2017   CORONARY ATHERECTOMY N/A 07/01/2017   Procedure: CORONARY ATHERECTOMY;  Surgeon: Arty Binning, MD;  Location: [facility] INVASIVE CV LAB;  Service: Cardiovascular;  Laterality: N/A;   CORONARY STENT INTERVENTION N/A 07/01/2017   Procedure: CORONARY STENT INTERVENTION;  Surgeon: Arty Binning, MD;  Location: [facility] INVASIVE CV LAB;  Service: Cardiovascular;  Laterality: N/A;   HAMMER TOE SURGERY     HEMORRHOID BANDING     HIP SURGERY Left 04/2009   hip examination under anesthesia followed by greater trochanteric bursectomy; iliotibial band tenotomy/notes 01/20/2011   I & D EXTREMITY Right 01/10/2018   Procedure: IRRIGATION AND DEBRIDEMENT RIGHT KNEE, APPLY WOUND VAC;  Surgeon: Timothy Ford, MD;  Location: [facility] OR;  Service: Orthopedics;  Laterality: Right;   KNEE BURSECTOMY Right 04/2009   Maximo Spar 01/09/2011   LEFT ATRIAL APPENDAGE OCCLUSION N/A 08/24/2021   Procedure: LEFT ATRIAL APPENDAGE OCCLUSION;  Surgeon: Arnoldo Lapping, MD;  Location: [facility] INVASIVE CV LAB;  Service: Cardiovascular;  Laterality: N/A;  LEFT HEART CATH AND CORONARY ANGIOGRAPHY N/A 06/26/2017   Procedure: LEFT HEART CATH AND CORONARY ANGIOGRAPHY;  Surgeon: Arty Binning, MD;  Location: [facility] INVASIVE CV LAB;  Service: Cardiovascular;  Laterality: N/A;   PUBOVAGINAL SLING  01/2002   Maximo Spar 01/23/2011   REDUCTION MAMMAPLASTY     TEE WITHOUT CARDIOVERSION N/A 08/24/2021   Procedure: TRANSESOPHAGEAL ECHOCARDIOGRAM (TEE);  Surgeon: Arnoldo Lapping, MD;  Location: [facility] INVASIVE CV LAB;  Service: Cardiovascular;  Laterality: N/A;   TEMPORARY PACEMAKER N/A 07/01/2017   Procedure: TEMPORARY PACEMAKER;  Surgeon: Arty Binning, MD;  Location: [facility] INVASIVE CV LAB;  Service: Cardiovascular;  Laterality: N/A;   VAGINAL HYSTERECTOMY  01/2002   Vaginal hysterectomy,  bilateral salpingo-oophorectomy/notes 01/23/2011   Patient Active Problem List   Diagnosis Date Noted   Clostridioides difficile infection 10/22/2023   Supraclavicular fossa fullness 09/24/2023   COVID-19 09/24/2023   Cough 06/28/2023   Degenerative tear of left medial meniscus 06/20/2023   History of skin cancer 05/20/2023   Melanocytic nevi of trunk 05/20/2023   Seborrheic keratoses 05/20/2023   Squamous cell carcinoma of skin of right lower extremity 05/20/2023   Mitral regurgitation 02/22/2023   Aneurysm of ascending aorta without rupture (HCC) 02/18/2023   Cellulitis of left leg 02/18/2023   Closed comminuted fracture of left humerus 02/07/2023   Bradycardia 02/07/2023   Fall 02/07/2023   Chronic a-fib (HCC) 02/07/2023   Statin intolerance 12/31/2022   Acute pain of right knee 12/06/2022   Impetigo 10/01/2022   Insomnia 10/01/2022   Osteoporosis 06/11/2022   Weakness 04/25/2022   Weakness generalized 04/25/2022   Falls frequently 04/19/2022   Iron  deficiency anemia 12/08/2021   Skin ulcer, limited to breakdown of skin (HCC) 11/22/2021   Anemia, iron  deficiency 11/22/2021   Low serum vitamin B12 11/22/2021   Acute renal failure superimposed on stage 2 chronic kidney disease (HCC) 11/20/2021   ABLA (acute blood loss anemia) 11/20/2021   Hypotension 11/19/2021   Shortness of breath 06/12/2021   Right leg pain 02/21/2021   C. difficile colitis    Abnormal CT of the abdomen    Aortic atherosclerosis (HCC) 08/27/2020   Acute prerenal azotemia 08/27/2020   Shock circulatory (HCC) 08/27/2020   Metabolic acidosis with normal anion gap and bicarbonate losses 08/27/2020   Abdominal cramps 03/16/2020   Neurogenic orthostatic hypotension (HCC) 09/16/2019   Greater trochanteric pain syndrome of right lower extremity 07/30/2019   Abdominal pain 05/22/2018   Bradycardia with 41-50 beats per minute 12/27/2017   Right bundle branch block (RBBB) on electrocardiogram (ECG) 12/27/2017    Chronic kidney disease, stage 3b (HCC) 12/24/2017   Cellulitis of leg, right with large prepatella hematoma and open wounds 12/24/2017   Iliotibial band syndrome of right side 12/16/2017   Intervertebral lumbar disc disorder with myelopathy, lumbar region 04/25/2017   Family history of colon cancer in mother 01/22/2017   Primary osteoarthritis of both first carpometacarpal joints 01/02/2017   Pain 11/21/2016   Gastroenteritis 09/13/2016   Chronic anticoagulation 05/24/2015   Coronary artery disease involving native coronary artery of native heart with angina pectoris (HCC) 12/20/2014   Permanent atrial fibrillation (HCC) 11/21/2012   Greater trochanteric bursitis of both hips 05/21/2012   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   Bruising 12/18/2010   Osteoarthritis of left hip 07/03/2010   DEGENERATIVE DISC DISEASE, LUMBOSACRAL SPINE 05/18/2010   SYNCOPE 10/27/2008   Anxiety 05/21/2008   IBS 05/21/2008   Essential hypertension 06/16/2007   GERD (gastroesophageal reflux disease) 06/16/2007  PCP: Plotnikov, Oakley Bellman, MD   REFERRING PROVIDER: Jasmine Mesi, MD   REFERRING DIAG:  Diagnosis  M54.16 (ICD-10-CM) - Lumbar radiculopathy    Rationale for Evaluation and Treatment: Rehabilitation  THERAPY DIAG:  Other low back pain  Muscle weakness  Difficulty in walking, not elsewhere classified  Pain in right hip  Chronic pain of right knee  ONSET DATE: over a year c worsening   SUBJECTIVE:                                                                                                                                                                                           SUBJECTIVE STATEMENT: Pt reporting pain of "100" when she first got out of bed this morning. Pt reporting 3-4/10 pain upon arrival. Pt stating as she gets up and moves around her pain decreases.    Eval:  Pt arriving reporting worsening back pain over the last year where is limiting her  ADL's and functional mobility. Pt lives with her 85 year old husband which recently had a fall and hurt his wrist. Pt reporting radiation pain down Rt LE into anterior thigh and knee.   PERTINENT HISTORY:  Bradycardia 41-50 beats per minute, CA, CKD, cellulitis of Rt leg with large pre-patella hematoma with open wounds 12/2017, hip bursitis 2010, HSV 2025, osteopenia, left hip surgery 2010, temporary pacemaker 2018, see other PMH above  PAIN:  NPRS scale: 3-4/10 Pain location: right sided low back, Rt hip, Rt knee Pain description: achy, sharp at times reaching 9-10/10 Aggravating factors: bending, extension, sit to stand Relieving factors: sitting  PRECAUTIONS: no  WEIGHT BEARING RESTRICTIONS: No  FALLS:  Has patient fallen in last 6 months? No  LIVING ENVIRONMENT: Lives with: lives with their family and lives with their spouse Lives in: House/apartment  Has following equipment at home: None  OCCUPATION: retired  PLOF: Independent  PATIENT GOALS: perform gym activities without pain  Next MD Visit:    OBJECTIVE:   DIAGNOSTIC FINDINGS:  No updated lumbar images found  PATIENT SURVEYS:  Patient-Specific Activity Scoring Scheme  "0" represents "unable to perform." "10" represents "able to perform at prior level. 0 1 2 3 4 5 6 7 8 9  10 (Date and Score)   Activity Eval  01/14/24    1. Take a walk  1     2. Stand for longer periods 3     3. Sit for longer periods  9   4.climb stairs 8   5.    Score 21/4=5.25    Total score = sum of the activity scores/number of activities Minimum detectable change (90%CI) for  average score = 2 points Minimum detectable change (90%CI) for single activity score = 3 points  SCREENING FOR RED FLAGS: Bowel or bladder incontinence: No Cauda equina syndrome: No  COGNITION: Overall cognitive status: WFL normal      SENSATION: Numbness and tingling down Rt hip into Rt knee.    POSTURE:  forward head and decreased lumbar  lordosis  PALPATION: TTP: lumbar paraspinals  LUMBAR ROM:   Directional Preference Assessment: Centralization: Peripheralization:   AROM eval  Flexion WFL  Extension 20 c pain  Right lateral flexion 28  Left lateral flexion 25  Right rotation 75%  Left rotation 50%   (Blank rows = not tested)  LOWER EXTREMITY ROM:       Right 01/14/24 Left 01/14/24  Hip flexion 108 110  Hip extension    Hip abduction    Hip adduction    Hip internal rotation    Hip external rotation    Knee flexion    Knee extension    Ankle dorsiflexion    Ankle plantarflexion    Ankle inversion    Ankle eversion     (Blank rows = not tested)  LOWER EXTREMITY MMT:    MMT Right Eval 01/14/24 sitting Left Eval 01/14/24 sitting  Hip flexion 19.8 27.5  Hip extension    Hip abduction    Hip adduction    Hip internal rotation    Hip external rotation    Knee flexion 22.6 17.8  Knee extension 16.5 20.6  Ankle dorsiflexion    Ankle plantarflexion    Ankle inversion    Ankle eversion     (Blank rows = not tested)  LUMBAR SPECIAL TESTS:  01/14/24 Slump test: Positive on the Rt  FUNCTIONAL TESTS: 01/14/24  Timed up and go (TUG): 9.98 seconds UE used to push off chair  GAIT: 01/14/24 Antalgic gait pattern in clinic gym, limp noted on Rt LE due to pain                                                                                                                                                                                                                   TODAY'S TREATMENT:  DATE:  01/20/24:  Kathlyn Parcel: Nustep: level 3 LE's and intermittent UE's x 6 minutes Standing trunk ext, elbows on the wall x 10 holding 10 sec each Supine: piriformis stretch: knee to opposite shoulder x 2 bil LE holding 30 sec Supine bridge: x 10 holding 3 sec Supine SLR: x 10 bil TherActivites:  Double Leg Press: 43# 3  x 10  Manual:  IASTM to Rt IT band and glues and bilateral lumbar paraspinals and QL       01/14/24  Therex:    HEP instruction/performance c cues for techniques, handout provided.  Trial set performed of each for comprehension and symptom assessment.  See below for exercise list   PATIENT EDUCATION:  Education details: HEP, POC, discussed possibility of DN Person educated: Patient Education method: Explanation, Demonstration, Verbal cues, and Handouts Education comprehension: verbalized understanding, returned demonstration, and verbal cues required  HOME EXERCISE PROGRAM: Access Code: [email or web address] URL: [email or web address] Date: 01/14/2024 Prepared by: Jerrel Mor  Exercises - Supine Posterior Pelvic Tilt  - 1-2 x daily - 7 x weekly - 1-2 sets - 10 reps - 5 seconds hold - Supine Piriformis Stretch with Foot on Ground  - 1-2 x daily - 7 x weekly - 4 reps - 20 seconds hold - Supine March with Posterior Pelvic Tilt  - 1-2 x daily - 7 x weekly - 2 sets - 10 reps - Supine Lower Trunk Rotation  - 1-2 x daily - 7 x weekly - 4 reps - 20 seconds hold - Hooklying Single Knee to Chest Stretch  - 1-2 x daily - 7 x weekly - 4 reps - 20 seconds hold  ASSESSMENT:  CLINICAL IMPRESSION: Pt arriving today reporting worse pain first thing in the morning which improves as she moves around with her ADL's. Pt arriving with 4/10 pain. Pt's HEP was reviewed and pt tolerating all exercises well. Pt able to demonstrate compliance in her HEP. Pt with good response to IASTM. Recommending continued skilled PT interventions to maximize pt's function.   OBJECTIVE IMPAIRMENTS: difficulty walking, decreased ROM, decreased strength, and pain.   ACTIVITY LIMITATIONS: lifting, bending, sitting, standing, stairs, and transfers  PARTICIPATION LIMITATIONS: community activity  PERSONAL FACTORS: 3+ comorbidities: see PMH above are also affecting patient's functional outcome.   REHAB POTENTIAL:  Good  CLINICAL DECISION MAKING: Evolving/moderate complexity  EVALUATION COMPLEXITY: Moderate   GOALS: Goals reviewed with patient? Yes  SHORT TERM GOALS: (target date for Short term goals are 3 weeks 02/04/2024)  1. Patient will demonstrate independent use of home exercise program to maintain progress from in clinic treatments.  Goal status: MET  01/20/24  LONG TERM GOALS: (target dates for all long term goals are 10 weeks  03/24/2024 )   1. Patient will demonstrate/report pain at worst less than or equal to 2/10 to facilitate minimal limitation in daily activity secondary to pain symptoms.  Goal status: New   2. Patient will demonstrate independent use of home exercise program to facilitate ability to maintain/progress functional gains from skilled physical therapy services.  Goal status: New   3. Patient will demonstrate Patient specific functional scale avg > or = 7.25 to indicate reduced disability due to condition.   Goal status: New   4. Patient will demonstrate lumbar extension with  pain </=3/10 WFL.   Goal status: New   5.  Pt will be able to navigate 1 flight of stairs with single hand rail reciprocal gait pattern safely with hip pain <= 3/10.  Goal status: New  6.  Pt will improve her bil LE strength by 5 ppsi in hip flexion, knee flexion and knee extension bilaterally.  Goal status: New     PLAN:  PT FREQUENCY: 1-2x/week  PT DURATION: 10 weeks  PLANNED INTERVENTIONS: Can include 11914- PT Re-evaluation, 97110-Therapeutic exercises, 97530- Therapeutic activity, 97112- Neuromuscular re-education, [phone]- Self Care, 97140- Manual therapy, [phone]- Gait training, [phone]- Orthotic Fit/training, [phone]- Canalith repositioning, [id number]- Aquatic Therapy, [phone]- Electrical stimulation (unattended), 97750 Physical performance testing, [id number]- Electrical stimulation (manual), 97016- Vasopneumatic device, [id number]- Ultrasound, [id number]- Traction (mechanical), [id number]- Ionotophoresis  4mg /ml Dexamethasone , Patient/Family education, Balance training, Stair training, Taping, Dry Needling, Joint mobilization, Joint manipulation, Spinal manipulation, Spinal mobilization, Scar mobilization, Vestibular training, Visual/preceptual remediation/compensation, DME instructions, Cryotherapy, and Moist heat.  All performed as medically necessary.  All included unless contraindicated  PLAN FOR NEXT SESSION: Core strengthening, manual as needed, posture work, consider DN   Marysue Sola, PT, MPT 01/20/2024, 12:44 PM  Date of referral:   Referring provider: Jasmine Mesi, MD  Referring diagnosis? 12/26/23 Treatment diagnosis? (if different than referring diagnosis) M54.16 (ICD-10-CM) - Lumbar radiculopathy  What was this (referring dx) caused by? Ongoing Issue  Lonne Roan of Condition: Initial Onset (within last 3 months)   Laterality: Rt  Current Functional Measure Score: Other functional specific patient activity scale  Objective measurements identify impairments when they are compared to normal values, the uninvolved extremity, and prior level of function.  [x]  Yes  []  No  Objective assessment of functional ability: Minimal functional limitations   Briefly describe symptoms: pain in Rt side low back, radiating down Rt hip and anterior thigh, Pt also reporting Rt knee pain  How did symptoms start: on-going issue worsening over the last year  Average pain intensity:  Last 24 hours: 8/10 at times  Past week: 9/10 at times  How often does the pt experience symptoms? Frequently  How much have the symptoms interfered with usual daily activities? Moderately  How has condition changed since care began at this facility? NA - initial visit  In general, how is the patients overall health? Very Good   BACK PAIN (STarT Back Screening Tool) Has pain spread down the leg(s) at some time in the last 2 weeks? yes Has there been pain in the shoulder or neck at some time in the  last 2 weeks? yes Has the pt only walked short distances because of back pain? yes Has patient dressed more slowly because of back pain in the past 2 weeks? no Does patient think it's not safe for a person with this condition to be physically active? no Does patient have worrying thoughts a lot of the time? Yes at times Does patient feel back pain is terrible and will never get any better? probably Has patient stopped enjoying things they usually enjoy? yes

## 2024-01-21 ENCOUNTER — Other Ambulatory Visit: Payer: Self-pay | Admitting: Dermatology

## 2024-01-21 DIAGNOSIS — R2242 Localized swelling, mass and lump, left lower limb: Secondary | ICD-10-CM

## 2024-01-23 ENCOUNTER — Ambulatory Visit
Admission: RE | Admit: 2024-01-23 | Discharge: 2024-01-23 | Discharge status: Home / Self Care | Source: Ambulatory Visit | Attending: Dermatology | Admitting: Dermatology

## 2024-01-23 DIAGNOSIS — R2242 Localized swelling, mass and lump, left lower limb: Secondary | ICD-10-CM

## 2024-01-30 ENCOUNTER — Encounter: Admitting: Physical Therapy

## 2024-01-30 ENCOUNTER — Telehealth: Payer: Self-pay | Admitting: Physical Medicine and Rehabilitation

## 2024-01-30 NOTE — Telephone Encounter (Signed)
 Patient called. She would like to know if she could get another injection?

## 2024-02-05 ENCOUNTER — Telehealth: Payer: Self-pay | Admitting: Physical Medicine and Rehabilitation

## 2024-02-05 DIAGNOSIS — C44722 Squamous cell carcinoma of skin of right lower limb, including hip: Secondary | ICD-10-CM | POA: Diagnosis not present

## 2024-02-05 NOTE — Telephone Encounter (Signed)
 Patient called. She would like to know if it's time for another injection or not?

## 2024-02-06 ENCOUNTER — Ambulatory Visit: Admitting: Orthopedic Surgery

## 2024-02-06 ENCOUNTER — Ambulatory Visit: Admitting: Internal Medicine

## 2024-02-06 ENCOUNTER — Encounter: Admitting: Physical Therapy

## 2024-02-10 ENCOUNTER — Ambulatory Visit: Admitting: Orthopedic Surgery

## 2024-02-11 ENCOUNTER — Encounter: Admitting: Physical Therapy

## 2024-02-13 DIAGNOSIS — L089 Local infection of the skin and subcutaneous tissue, unspecified: Secondary | ICD-10-CM | POA: Diagnosis not present

## 2024-02-17 ENCOUNTER — Ambulatory Visit: Admitting: Orthopedic Surgery

## 2024-02-18 ENCOUNTER — Encounter: Admitting: Physical Therapy

## 2024-02-19 ENCOUNTER — Encounter: Payer: Self-pay | Admitting: Internal Medicine

## 2024-02-19 ENCOUNTER — Ambulatory Visit (INDEPENDENT_AMBULATORY_CARE_PROVIDER_SITE_OTHER): Admitting: Internal Medicine

## 2024-02-19 VITALS — BP 122/80 | HR 67 | Temp 98.3°F | Ht 64.0 in | Wt 146.0 lb

## 2024-02-19 DIAGNOSIS — E538 Deficiency of other specified B group vitamins: Secondary | ICD-10-CM

## 2024-02-19 DIAGNOSIS — I10 Essential (primary) hypertension: Secondary | ICD-10-CM

## 2024-02-19 DIAGNOSIS — D500 Iron deficiency anemia secondary to blood loss (chronic): Secondary | ICD-10-CM

## 2024-02-19 DIAGNOSIS — E559 Vitamin D deficiency, unspecified: Secondary | ICD-10-CM | POA: Diagnosis not present

## 2024-02-19 DIAGNOSIS — Z0000 Encounter for general adult medical examination without abnormal findings: Secondary | ICD-10-CM | POA: Diagnosis not present

## 2024-02-19 DIAGNOSIS — L089 Local infection of the skin and subcutaneous tissue, unspecified: Secondary | ICD-10-CM

## 2024-02-19 DIAGNOSIS — N1832 Chronic kidney disease, stage 3b: Secondary | ICD-10-CM | POA: Diagnosis not present

## 2024-02-19 MED ORDER — MUPIROCIN 2 % EX OINT: TOPICAL_OINTMENT | CUTANEOUS | 1 refills | Status: DC

## 2024-02-19 NOTE — Assessment & Plan Note (Signed)
Continue with metoprolol and losartan.  Hydrate well 

## 2024-02-19 NOTE — Progress Notes (Signed)
 Subjective:  Patient ID: Hailey [name], female    DOB: [birth year]  Age: 89 y.o. MRN: [id number]  CC: Medical Management of Chronic Issues (Pt want to discuss surgery for skin cancer x 2 weeks and ultrasound of she had her buttocks x 3weeks ... Pt states her heart has been running at night went laying down. Has been able to exercise as usually because of the hip pain.)   HPI Hailey [name] presents for R leg wound  Well exam  Outpatient Medications Prior to Visit  Medication Sig Dispense Refill   acetaminophen  (TYLENOL ) 500 MG tablet Take 1,000 mg by mouth every 6 (six) hours as needed (body aches / sore hip). Maximum of 6 tablets daily (3000mg )     BIOTIN PO Take 5,000 mcg by mouth in the morning.     clobetasol  ointment (TEMOVATE ) 0.05 % Apply to the affected area in a thin layer twice a day for 2 weeks as needed for a flare.  Then apply to the area twice a week at bedtime as maintenance dosing. 60 g 1   diclofenac  Sodium (VOLTAREN ) 1 % GEL Apply 2 g topically 4 (four) times daily as needed (back pain.).     dicyclomine  (BENTYL ) 20 MG tablet Take 1 by mouth every 4-6 hours as needed for cramping 30 tablet 6   ELIQUIS  2.5 MG TABS tablet TAKE 1 TABLET BY MOUTH TWICE  DAILY 200 tablet 2   empagliflozin  (JARDIANCE ) 10 MG TABS tablet TAKE 1 TABLET BY MOUTH DAILY  BEFORE BREAKFAST 90 tablet 3   escitalopram  (LEXAPRO ) 10 MG tablet Take 1 tablet (10 mg total) by mouth daily. 30 tablet 11   esomeprazole  (NEXIUM ) 40 MG capsule TAKE ONE CAPSULE ONCE DAILY 90 capsule 1   Homeopathic Products (ARNICARE) GEL Apply 1 application  topically daily as needed (skin bruising).     hyoscyamine  (LEVSIN ) 0.125 MG tablet TAKE 1-2 TABLETS EVERY 4 HOURS AS NEEDED FOR UP TO 10 DAYS FOR CRAMPING. 100 tablet 3   LORazepam  (ATIVAN ) 1 MG tablet TAKE ONE TABLET BY MOUTH TWICE DAILY AS NEEDED FOR ANXIETY / SLEEP. 180 tablet 1   losartan  (COZAAR ) 25 MG tablet Take 1 tablet (25 mg total) by mouth daily. 90 tablet 3    nitroGLYCERIN  (NITROSTAT ) 0.4 MG SL tablet Place 1 tablet (0.4 mg total) under the tongue every 5 (five) minutes as needed for chest pain (Call 911 at 3rd dose within 15 minutes.). 20 tablet 3   NON FORMULARY Vitamin c, vitamin d      Polyethyl Glycol-Propyl Glycol (LUBRICANT EYE DROPS) 0.4-0.3 % SOLN Place 1-2 drops into both eyes at bedtime.     polyethylene glycol (MIRALAX  / GLYCOLAX ) 17 g packet Take 17 g by mouth as needed for mild constipation or moderate constipation.     sodium chloride  (OCEAN) 0.65 % SOLN nasal spray Place 1 spray into both nostrils at bedtime as needed for congestion.     terconazole  (TERAZOL 7 ) 0.4 % vaginal cream Place 1 applicator vaginally at bedtime. One applicator full QHS for seven days of therapy 45 g 0   valACYclovir  (VALTREX ) 500 MG tablet Take 1 tablet (500 mg total) by mouth daily. Take one tablet by mouth daily for 3 days for an outbreak 30 tablet 1   No facility-administered medications prior to visit.    ROS: Review of Systems  Constitutional:  Negative for activity change, appetite change, chills, fatigue and unexpected weight change.  HENT:  Negative for congestion, mouth sores  and sinus pressure.   Eyes:  Negative for visual disturbance.  Respiratory:  Negative for cough and chest tightness.   Gastrointestinal:  Negative for abdominal pain and nausea.  Genitourinary:  Negative for difficulty urinating, frequency and vaginal pain.  Musculoskeletal:  Positive for arthralgias, back pain and gait problem.  Skin:  Positive for color change and wound. Negative for pallor and rash.  Neurological:  Negative for dizziness, tremors, weakness, numbness and headaches.  Psychiatric/Behavioral:  Negative for confusion and sleep disturbance.     Objective:  BP 122/80   Pulse 67   Temp 98.3 F (36.8 C) (Oral)   Ht 5' 4 (1.626 m)   Wt 146 lb (66.2 kg)   SpO2 92%   BMI 25.06 kg/m   BP Readings from Last 3 Encounters:  02/19/24 122/80  12/19/23 126/82   12/11/23 118/74    Wt Readings from Last 3 Encounters:  02/19/24 146 lb (66.2 kg)  12/19/23 141 lb (64 kg)  12/11/23 145 lb 12.8 oz (66.1 kg)    Physical Exam Constitutional:      General: She is not in acute distress.    Appearance: She is well-developed.  HENT:     Head: Normocephalic.     Right Ear: External ear normal.     Left Ear: External ear normal.     Nose: Nose normal.  Eyes:     General:        Right eye: No discharge.        Left eye: No discharge.     Conjunctiva/sclera: Conjunctivae normal.     Pupils: Pupils are equal, round, and reactive to light.  Neck:     Thyroid : No thyromegaly.     Vascular: No JVD.     Trachea: No tracheal deviation.  Cardiovascular:     Rate and Rhythm: Normal rate and regular rhythm.     Heart sounds: Normal heart sounds.  Pulmonary:     Effort: No respiratory distress.     Breath sounds: No stridor. No wheezing.  Abdominal:     General: Bowel sounds are normal. There is no distension.     Palpations: Abdomen is soft. There is no mass.     Tenderness: There is no abdominal tenderness. There is no guarding or rebound.  Musculoskeletal:        General: No tenderness.     Cervical back: Normal range of motion and neck supple. No rigidity.  Lymphadenopathy:     Cervical: No cervical adenopathy.  Skin:    Findings: No erythema or rash.  Neurological:     Mental Status: She is oriented to person, place, and time.     Cranial Nerves: No cranial nerve deficit.     Motor: No abnormal muscle tone.     Coordination: Coordination normal.     Deep Tendon Reflexes: Reflexes normal.  Psychiatric:        Behavior: Behavior normal.        Thought Content: Thought content normal.        Judgment: Judgment normal.  A small clean RLE wound was dressed with Bactroban /Band-Aid.  Ace wrap  Lab Results  Component Value Date   WBC 5.5 10/11/2023   HGB 13.8 10/11/2023   HCT 41.5 10/11/2023   PLT 219.0 10/11/2023   GLUCOSE 79 10/11/2023    CHOL 145 02/18/2023   TRIG 125.0 02/18/2023   HDL 63.60 02/18/2023   LDLDIRECT 89.1 06/29/2013   LDLCALC 57 02/18/2023   ALT 9  10/11/2023   AST 19 10/11/2023   NA 134 (L) 10/11/2023   K 4.5 10/11/2023   CL 98 10/11/2023   CREATININE 1.09 10/11/2023   BUN 13 10/11/2023   CO2 27 10/11/2023   TSH 1.685 02/07/2023   INR 1.2 02/07/2023   HGBA1C 5.5 02/08/2023    US  LT LOWER EXTREM LTD SOFT TISSUE NON VASCULAR Result Date: 02/14/2024 CLINICAL DATA:  Palpable left leg nodule.  History of skin cancer. EXAM: ULTRASOUND LEFT LOWER EXTREMITY LIMITED TECHNIQUE: Ultrasound examination of the lower extremity soft tissues was performed in the area of clinical concern. COMPARISON:  None Available. FINDINGS: Sonographic evaluation in the area of reported palpable abnormality along the posterolateral proximal left thigh demonstrates a 9 x 9 mm calcified subcutaneous lesion with posterior acoustic shadowing and internal vascular flow. IMPRESSION: Sonographic evaluation in the area of reported palpable abnormality along the posterolateral proximal left thigh demonstrates a 9 x 9 mm calcified subcutaneous lesion with posterior acoustic shadowing and internal vascular flow. This finding is nonspecific and could represent a calcified hemangioma, calcified epidermoid cyst, or neoplastic process, particularly in the setting of a history of skin cancer. Consider further evaluation with MRI with contrast and/or direct tissue sampling. Electronically Signed   By: Mannie Seek M.D.   On: 02/14/2024 10:41    Assessment & Plan:   Problem List Items Addressed This Visit     Essential hypertension - Primary   Continue with metoprolol  and losartan .  Hydrate well      Relevant Orders   TSH   Urinalysis   CBC with Differential/Platelet   Lipid panel   Comprehensive metabolic panel with GFR   Anemia, iron  deficiency   Relevant Orders   CBC with Differential/Platelet   Low serum vitamin B12   Relevant Orders    Vitamin B12   Other Visit Diagnoses       Well adult exam       Relevant Orders   TSH   Urinalysis   CBC with Differential/Platelet   Lipid panel   Comprehensive metabolic panel with GFR     Vitamin D  deficiency       Relevant Orders   VITAMIN D  25 Hydroxy (Vit-D Deficiency, Fractures)         Meds ordered this encounter  Medications   mupirocin  ointment (BACTROBAN ) 2 %    Sig: On leg wound w/dressing change qd or bid    Dispense:  30 g    Refill:  1      Follow-up: Return in about 3 months (around 05/21/2024) for a follow-up visit.  Anitra Barn, MD

## 2024-02-21 ENCOUNTER — Other Ambulatory Visit (INDEPENDENT_AMBULATORY_CARE_PROVIDER_SITE_OTHER)

## 2024-02-21 ENCOUNTER — Telehealth: Payer: Self-pay | Admitting: Physical Medicine and Rehabilitation

## 2024-02-21 ENCOUNTER — Other Ambulatory Visit: Payer: Self-pay

## 2024-02-21 ENCOUNTER — Ambulatory Visit (INDEPENDENT_AMBULATORY_CARE_PROVIDER_SITE_OTHER): Admitting: Orthopedic Surgery

## 2024-02-21 ENCOUNTER — Encounter: Payer: Self-pay | Admitting: Orthopedic Surgery

## 2024-02-21 DIAGNOSIS — M25512 Pain in left shoulder: Secondary | ICD-10-CM

## 2024-02-21 DIAGNOSIS — M79604 Pain in right leg: Secondary | ICD-10-CM

## 2024-02-21 DIAGNOSIS — M19012 Primary osteoarthritis, left shoulder: Secondary | ICD-10-CM | POA: Diagnosis not present

## 2024-02-21 NOTE — Progress Notes (Unsigned)
 Office Visit Note   Patient: Hailey [name]           Date of Birth: 03-16-1935           MRN: [id number] Visit Date: 02/21/2024 Requested by: Genia Kettering, MD [address] [place],  [place] [address] PCP: Plotnikov, Oakley Bellman, MD  Subjective: Chief Complaint  Patient presents with   Left Shoulder - Pain   Right Leg - Pain    HPI: JHOANA [name] is a 89 y.o. female who presents to the office reporting left shoulder pain.  Patient had a proximal humerus fracture treated nonoperatively last year.  Has tried to get relief from physical therapy but that did not help much.  Glenohumeral joint injection gave her some relief for several months.  That was performed 11/14/2023.  Patient also reports right posterior buttock and low back pain.  Usually sees Dr. Daisey Dryer.  She reports radiating leg pain down to the knee and posterior and lateral leg.  She does report low back pain but no numbness and tingling.  Last epidural steroid injection with Dr. Daisey Dryer for 325 which did help some.  Denies any groin pain.              ROS: All systems reviewed are negative as they relate to the chief complaint within the history of present illness.  Patient denies fevers or chills.  Assessment & Plan: Visit Diagnoses:  1. Pain in right leg   2. Left shoulder pain, unspecified chronicity     Plan: Impression is left shoulder pain from posttraumatic deformity and arthritis in the glenohumeral joint.  Injection performed today.  Patient is also having some buttock and lateral sided pain which appears to be coming from the back.  Radiographs do not show much in terms of arthritis in the hip.  Also on exam no real groin pain with internal or external rotation of the leg.  We will get her set up to see Dr. Daisey Dryer for lumbar spine ESI.  Follow-up with us  as needed.  Follow-Up Instructions: No follow-ups on file.   Orders:  Orders Placed This Encounter  Procedures   XR HIP UNILAT W OR W/O PELVIS 2-3  VIEWS RIGHT   XR Lumbar Spine 2-3 Views   US  Guided Needle Placement - No Linked Charges   Ambulatory referral to Physical Medicine Rehab   No orders of the defined types were placed in this encounter.     Procedures: Large Joint Inj: L glenohumeral on 02/21/2024 6:10 PM Indications: diagnostic evaluation and pain Details: 22 G 3.5 in needle, ultrasound-guided posterior approach  Arthrogram: No  Medications: 9 mL bupivacaine  0.5 %; 5 mL lidocaine  1 %; 40 mg triamcinolone  acetonide 40 MG/ML Outcome: tolerated well, no immediate complications Procedure, treatment alternatives, risks and benefits explained, specific risks discussed. Consent was given by the patient. Immediately prior to procedure a time out was called to verify the correct patient, procedure, equipment, support staff and site/side marked as required. Patient was prepped and draped in the usual sterile fashion.       Clinical Data: No additional findings.  Objective: Vital Signs: There were no vitals taken for this visit.  Physical Exam:  Constitutional: Patient appears well-developed HEENT:  Head: Normocephalic Eyes:EOM are normal Neck: Normal range of motion Cardiovascular: Normal rate Pulmonary/chest: Effort normal Neurologic: Patient is alert Skin: Skin is warm Psychiatric: Patient has normal mood and affect  Ortho Exam: Ortho exam demonstrates slightly antalgic gait to the  right but is not a Trendelenburg gait.  No groin pain with internal/external Tatian of the leg.  Patient has 5 out of 5 ankle dorsiflexion plantarflexion quad and hamstring strength.  Also very good hip flexion abduction adduction strength.  Left shoulder exam demonstrates diminished range of motion but reasonable rotator cuff strength.  Deltoid does fire.  Some crepitus is present with passive range of motion.  Motor or sensory function in the hand otherwise intact.  Specialty Comments:  EXAM: MRI LUMBAR SPINE WITHOUT CONTRAST    TECHNIQUE: Multiplanar, multisequence MR imaging of the lumbar spine was performed. No intravenous contrast was administered.   COMPARISON:  MR lumbar 03/20/2017; X-ray lumbar 09/14/2021.   FINDINGS: Segmentation:  Standard.   Alignment: Scoliotic thoracolumbar curvature. Mild retrolisthesis at L1-2, L2-3, and L3-4.   Vertebrae: No fracture, evidence of discitis, or suspicious bone lesion. Scattered intraosseous hemangiomas.   Conus medullaris and cauda equina: Conus extends to the L1 level. Conus and cauda equina appear normal.   Paraspinal and other soft tissues: No acute findings.   Disc levels:   T12-L1: Mild disc bulge. No foraminal or canal stenosis. Unchanged.   L1-L2: Disc height loss with mild disc bulge and endplate ridging. Mild left foraminal stenosis. No canal stenosis. Unchanged.   L2-L3: Retrolisthesis. Diffuse disc bulge with small caudally extending right paracentral disc extrusion. Mild bilateral facet arthropathy. Mild canal stenosis with moderate bilateral foraminal stenosis. Findings have progressed from prior.   L3-L4: Diffuse disc bulge with mild bilateral facet arthropathy and ligamentum flavum buckling. Findings result in mild-moderate canal stenosis with moderate right foraminal stenosis. Findings progressed from prior.   L4-L5: Disc bulge with endplate osteophytic ridging. Advanced bilateral facet arthropathy. Severe right and mild left foraminal stenosis. No significant interval progression.   L5-S1: Disc height loss and endplate ridging. Mild facet hypertrophy. No canal stenosis. Mild-to-moderate left and mild right foraminal stenosis. Unchanged.   IMPRESSION: 1. Multilevel lumbar spondylosis, slightly progressed from prior MRI. 2. Mild-moderate canal stenosis with moderate right foraminal stenosis at L3-4. 3. Mild canal stenosis and moderate bilateral foraminal stenosis at L2-3. 4. Severe right and mild left foraminal stenosis at  L4-5.     Electronically Signed   By: Leverne Reading D.O.   On: 11/29/2021 13:23  Imaging: XR HIP UNILAT W OR W/O PELVIS 2-3 VIEWS RIGHT Result Date: 02/21/2024 AP pelvis lateral radiographs right hip reviewed.  No acute fracture.  Minimal degenerative arthritis in the right hip joint.  Bones do appear slightly osteopenic.  XR Lumbar Spine 2-3 Views Result Date: 02/21/2024 AP lateral radiographs lumbar spine reviewed.  Scoliosis is present with corresponding degenerative joint disease between the vertebral bodies as well as facet arthritis.  No acute compression fracture or spondylolisthesis.  US  Guided Needle Placement - No Linked Charges Result Date: 02/21/2024 Ultrasound imaging demonstrates needle placement into the glenohumeral joint with injection of fluid into the joint and no complicating features. Left shoulder    PMFS History: Patient Active Problem List   Diagnosis Date Noted   Clostridioides difficile infection 10/22/2023   Supraclavicular fossa fullness 09/24/2023   COVID-19 09/24/2023   Cough 06/28/2023   Degenerative tear of left medial meniscus 06/20/2023   History of skin cancer 05/20/2023   Melanocytic nevi of trunk 05/20/2023   Seborrheic keratoses 05/20/2023   Squamous cell carcinoma of skin of right lower extremity 05/20/2023   Mitral regurgitation 02/22/2023   Aneurysm of ascending aorta without rupture (HCC) 02/18/2023   Cellulitis of left leg 02/18/2023  Closed comminuted fracture of left humerus 02/07/2023   Bradycardia 02/07/2023   Fall 02/07/2023   Chronic a-fib (HCC) 02/07/2023   Statin intolerance 12/31/2022   Acute pain of right knee 12/06/2022   Impetigo 10/01/2022   Insomnia 10/01/2022   Osteoporosis 06/11/2022   Weakness 04/25/2022   Weakness generalized 04/25/2022   Falls frequently 04/19/2022   Iron  deficiency anemia 12/08/2021   Skin ulcer, limited to breakdown of skin (HCC) 11/22/2021   Anemia, iron  deficiency 11/22/2021   Low  serum vitamin B12 11/22/2021   Acute renal failure superimposed on stage 2 chronic kidney disease (HCC) 11/20/2021   ABLA (acute blood loss anemia) 11/20/2021   Hypotension 11/19/2021   Shortness of breath 06/12/2021   Right leg pain 02/21/2021   C. difficile colitis    Abnormal CT of the abdomen    Aortic atherosclerosis (HCC) 08/27/2020   Acute prerenal azotemia 08/27/2020   Shock circulatory (HCC) 08/27/2020   Metabolic acidosis with normal anion gap and bicarbonate losses 08/27/2020   Abdominal cramps 03/16/2020   Neurogenic orthostatic hypotension (HCC) 09/16/2019   Greater trochanteric pain syndrome of right lower extremity 07/30/2019   Abdominal pain 05/22/2018   Bradycardia with 41-50 beats per minute 12/27/2017   Right bundle branch block (RBBB) on electrocardiogram (ECG) 12/27/2017   Chronic kidney disease, stage 3b (HCC) 12/24/2017   Cellulitis of leg, right with large prepatella hematoma and open wounds 12/24/2017   Iliotibial band syndrome of right side 12/16/2017   Intervertebral lumbar disc disorder with myelopathy, lumbar region 04/25/2017   Family history of colon cancer in mother 01/22/2017   Primary osteoarthritis of both first carpometacarpal joints 01/02/2017   Pain 11/21/2016   Gastroenteritis 09/13/2016   Chronic anticoagulation 05/24/2015   Coronary artery disease involving native coronary artery of native heart with angina pectoris (HCC) 12/20/2014   Permanent atrial fibrillation (HCC) 11/21/2012   Greater trochanteric bursitis of both hips 05/21/2012   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   Bruising 12/18/2010   Osteoarthritis of left hip 07/03/2010   DEGENERATIVE DISC DISEASE, LUMBOSACRAL SPINE 05/18/2010   SYNCOPE 10/27/2008   Anxiety 05/21/2008   IBS 05/21/2008   Essential hypertension 06/16/2007   GERD (gastroesophageal reflux disease) 06/16/2007   Past Medical History:  Diagnosis Date   Acute blood loss anemia 12/25/2017   Allergic rhinitis     Anxiety    Arthritis    my whole spine (07/01/2017)   Arthritis    Atrial fibrillation (HCC)    Bowel obstruction (HCC)    in [place]    Bradycardia with 41-50 beats per minute 12/27/2017   Cancer (HCC)    Cellulitis of leg, right with large prepatella hematoma and open wounds 12/26/2017   CKD (chronic kidney disease) stage 2, GFR 60-89 ml/min    Colon polyps    Coronary artery disease    10/18 PCI/DES to p/m LCx with cutting balloon to mLcx   Diverticulosis of colon    GERD (gastroesophageal reflux disease)    Hip bursitis 2010   Dr Watson Hacking, Post op seroma   History of colon polyps    HSV (herpes simplex virus) anogenital infection 07/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   HTN (hypertension)    IBS (irritable bowel syndrome)    constipation predominant - Dr Andriette Keeling   Lichen sclerosus    Osteopenia 11/2016   T score -2.0 FRAX 15%/4.3%   PAC (premature atrial contraction)    Symptomatiic   Renal insufficiency    Right  bundle branch block (RBBB) on electrocardiogram (ECG) 12/27/2017   Scoliosis    SVT (supraventricular tachycardia) (HCC)    brief history   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   biopsy showing vulvar atypia, possible VIN I    Family History  Problem Relation Age of Onset   Colon cancer Mother        Dx age 96, died at age 35   Diabetes Father    Prostate cancer Father    Prostate cancer Brother    Pancreatic cancer Brother    Stomach cancer Son     Past Surgical History:  Procedure Laterality Date   ANTERIOR AND POSTERIOR VAGINAL REPAIR  01/2002   Maximo Spar 01/23/2011   APPENDECTOMY  1948   CARDIAC CATHETERIZATION  06/26/2017   CORONARY ANGIOPLASTY WITH STENT PLACEMENT  07/01/2017   CORONARY ATHERECTOMY N/A 07/01/2017   Procedure: CORONARY ATHERECTOMY;  Surgeon: Arty Binning, MD;  Location: [facility]ASIVE [facility];  Service: Cardiovascular;  Laterality: N/A;   CORONARY STENT INTERVENTION N/A 07/01/2017   Procedure: CORONARY STENT  INTERVENTION;  Surgeon: Arty Binning, MD;  Location: [facility];  Service: Cardiovascular;  Laterality: N/A;   HAMMER TOE SURGERY     HEMORRHOID BANDING     HIP SURGERY Left 04/2009   hip examination under anesthesia followed by greater trochanteric bursectomy; iliotibial band tenotomy/notes 01/20/2011   I & D EXTREMITY Right 01/10/2018   Procedure: IRRIGATION AND DEBRIDEMENT RIGHT KNEE, APPLY WOUND VAC;  Surgeon: Timothy Ford, MD;  Location: [facility] OR;  Service: Orthopedics;  Laterality: Right;   KNEE BURSECTOMY Right 04/2009   Maximo Spar 01/09/2011   LEFT ATRIAL APPENDAGE OCCLUSION N/A 08/24/2021   Procedure: LEFT ATRIAL APPENDAGE OCCLUSION;  Surgeon: Arnoldo Lapping, MD;  Location: [facility] INVASIVE CV LAB;  Service: Cardiovascular;  Laterality: N/A;   LEFT HEART CATH AND CORONARY ANGIOGRAPHY N/A 06/26/2017   Procedure: LEFT HEART CATH AND CORONARY ANGIOGRAPHY;  Surgeon: Arty Binning, MD;  Location: [facility];  Service: Cardiovascular;  Laterality: N/A;   PUBOVAGINAL SLING  01/2002   Maximo Spar 01/23/2011   REDUCTION MAMMAPLASTY     TEE WITHOUT CARDIOVERSION N/A 08/24/2021   Procedure: TRANSESOPHAGEAL ECHOCARDIOGRAM (TEE);  Surgeon: Arnoldo Lapping, MD;  Location: [facility] INVASIVE CV LAB;  Service: Cardiovascular;  Laterality: N/A;   TEMPORARY PACEMAKER N/A 07/01/2017   Procedure: TEMPORARY PACEMAKER;  Surgeon: Arty Binning, MD;  Location[facility] INVASIVE CV LAB;  Service: Cardiovascular;  Laterality: N/A;   VAGINAL HYSTERECTOMY  01/2002   Vaginal hysterectomy, bilateral salpingo-oophorectomy/notes 01/23/2011   Social History   Occupational History   Occupation: Engineer, manufacturing systems: RETIRED  Tobacco Use   Smoking status: Never   Smokeless tobacco: Never  Vaping Use   Vaping status: Never Used  Substance and Sexual Activity   Alcohol use: Yes    Comment: moderatley [place] before dinner   Drug use: Never   Sexual activity: Not Currently    Birth control/protection: Surgical     Comment: hysterectomy; less than 5, IC after 16, no STD, no abnormal paps

## 2024-02-21 NOTE — Telephone Encounter (Signed)
 Patient called and wants to get an appointment for an injection. CB#[phone]

## 2024-02-22 MED ORDER — LIDOCAINE HCL 1 % IJ SOLN
5.0000 mL | INTRAMUSCULAR | Status: AC | PRN
  Administered 2024-02-21: 5 mL

## 2024-02-22 MED ORDER — TRIAMCINOLONE ACETONIDE 40 MG/ML IJ SUSP
40.0000 mg | INTRAMUSCULAR | Status: AC | PRN
  Administered 2024-02-21: 40 mg via INTRA_ARTICULAR

## 2024-02-22 MED ORDER — BUPIVACAINE HCL 0.5 % IJ SOLN
9.0000 mL | INTRAMUSCULAR | Status: AC | PRN
  Administered 2024-02-21: 9 mL via INTRA_ARTICULAR

## 2024-02-24 ENCOUNTER — Ambulatory Visit: Admitting: Orthopedic Surgery

## 2024-02-24 ENCOUNTER — Encounter: Payer: Self-pay | Admitting: Internal Medicine

## 2024-02-24 NOTE — Assessment & Plan Note (Signed)
Monitor GFR Continue to hydrate well

## 2024-02-24 NOTE — Assessment & Plan Note (Signed)

## 2024-02-24 NOTE — Assessment & Plan Note (Signed)
 Wound was dressed.  Bactroban  ointment.  Ace wrap

## 2024-02-24 NOTE — Assessment & Plan Note (Signed)
 Continue vitamin B12

## 2024-02-25 ENCOUNTER — Ambulatory Visit: Admitting: Physical Therapy

## 2024-02-25 ENCOUNTER — Encounter: Payer: Self-pay | Admitting: Physical Therapy

## 2024-02-25 DIAGNOSIS — R262 Difficulty in walking, not elsewhere classified: Secondary | ICD-10-CM

## 2024-02-25 DIAGNOSIS — M25551 Pain in right hip: Secondary | ICD-10-CM

## 2024-02-25 DIAGNOSIS — M25561 Pain in right knee: Secondary | ICD-10-CM

## 2024-02-25 DIAGNOSIS — G8929 Other chronic pain: Secondary | ICD-10-CM | POA: Diagnosis not present

## 2024-02-25 DIAGNOSIS — M6281 Muscle weakness (generalized): Secondary | ICD-10-CM

## 2024-02-25 DIAGNOSIS — M5459 Other low back pain: Secondary | ICD-10-CM

## 2024-02-25 NOTE — Therapy (Signed)
 a OUTPATIENT PHYSICAL THERAPY THORACOLUMBAR EVALUATION   Patient Name: Hailey [name] MRN: [id number] DOB:25-Apr-1935, 89 y.o., female Today's Date: 02/25/2024  END OF SESSION:  PT End of Session - 02/25/24 1026     Visit Number 3    Number of Visits 20    Date for PT Re-Evaluation 03/24/24    Authorization Type UHC/Medicare    Progress Note Due on Visit 10    PT Start Time 1017    PT Stop Time 1102    PT Time Calculation (min) 45 min    Activity Tolerance Patient tolerated treatment well    Behavior During Therapy [facility] for tasks assessed/performed            Past Medical History:  Diagnosis Date   Acute blood loss anemia 12/25/2017   Allergic rhinitis    Anxiety    Arthritis    my whole spine (07/01/2017)   Arthritis    Atrial fibrillation (HCC)    Bowel obstruction (HCC)    in [place]    Bradycardia with 41-50 beats per minute 12/27/2017   Cancer (HCC)    Cellulitis of leg, right with large prepatella hematoma and open wounds 12/26/2017   CKD (chronic kidney disease) stage 2, GFR 60-89 ml/min    Colon polyps    Coronary artery disease    10/18 PCI/DES to p/m LCx with cutting balloon to mLcx   Diverticulosis of colon    GERD (gastroesophageal reflux disease)    Hip bursitis 2010   Dr Watson Hacking, Post op seroma   History of colon polyps    HSV (herpes simplex virus) anogenital infection 07/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   HTN (hypertension)    IBS (irritable bowel syndrome)    constipation predominant - Dr Andriette Keeling   Lichen sclerosus    Osteopenia 11/2016   T score -2.0 FRAX 15%/4.3%   PAC (premature atrial contraction)    Symptomatiic   Renal insufficiency    Right bundle branch block (RBBB) on electrocardiogram (ECG) 12/27/2017   Scoliosis    SVT (supraventricular tachycardia) (HCC)    brief history   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   biopsy showing vulvar atypia, possible VIN I   Past Surgical History:  Procedure Laterality  Date   ANTERIOR AND POSTERIOR VAGINAL REPAIR  01/2002   Maximo Spar 01/23/2011   APPENDECTOMY  1948   CARDIAC CATHETERIZATION  06/26/2017   CORONARY ANGIOPLASTY WITH STENT PLACEMENT  07/01/2017   CORONARY ATHERECTOMY N/A 07/01/2017   Procedure: CORONARY ATHERECTOMY;  Surgeon: Arty Binning, MD;  Location: [facility] INVASIVE CV LAB;  Service: Cardiovascular;  Laterality: N/A;   CORONARY STENT INTERVENTION N/A 07/01/2017   Procedure: CORONARY STENT INTERVENTION;  Surgeon: Arty Binning, MD;  Location: [facility] INVASIVE CV LAB;  Service: Cardiovascular;  Laterality: N/A;   HAMMER TOE SURGERY     HEMORRHOID BANDING     HIP SURGERY Left 04/2009   hip examination under anesthesia followed by greater trochanteric bursectomy; iliotibial band tenotomy/notes 01/20/2011   I & D EXTREMITY Right 01/10/2018   Procedure: IRRIGATION AND DEBRIDEMENT RIGHT KNEE, APPLY WOUND VAC;  Surgeon: Timothy Ford, MD;  Location: [facility] OR;  Service: Orthopedics;  Laterality: Right;   KNEE BURSECTOMY Right 04/2009   Maximo Spar 01/09/2011   LEFT ATRIAL APPENDAGE OCCLUSION N/A 08/24/2021   Procedure: LEFT ATRIAL APPENDAGE OCCLUSION;  Surgeon: Arnoldo Lapping, MD;  Location: [facility] INVASIVE CV LAB;  Service: Cardiovascular;  Laterality: N/A;   LEFT HEART  CATH AND CORONARY ANGIOGRAPHY N/A 06/26/2017   Procedure: LEFT HEART CATH AND CORONARY ANGIOGRAPHY;  Surgeon: Arty Binning, MD;  Location: [facility] INVASIVE CV LAB;  Service: Cardiovascular;  Laterality: N/A;   PUBOVAGINAL SLING  01/2002   Maximo Spar 01/23/2011   REDUCTION MAMMAPLASTY     TEE WITHOUT CARDIOVERSION N/A 08/24/2021   Procedure: TRANSESOPHAGEAL ECHOCARDIOGRAM (TEE);  Surgeon: Arnoldo Lapping, MD;  Location: [facility] INVASIVE CV LAB;  Service: Cardiovascular;  Laterality: N/A;   TEMPORARY PACEMAKER N/A 07/01/2017   Procedure: TEMPORARY PACEMAKER;  Surgeon: Arty Binning, MD;  Location: [facility] INVASIVE CV LAB;  Service: Cardiovascular;  Laterality: N/A;   VAGINAL HYSTERECTOMY  01/2002   Vaginal hysterectomy,  bilateral salpingo-oophorectomy/notes 01/23/2011   Patient Active Problem List   Diagnosis Date Noted   Clostridioides difficile infection 10/22/2023   Supraclavicular fossa fullness 09/24/2023   COVID-19 09/24/2023   Cough 06/28/2023   Degenerative tear of left medial meniscus 06/20/2023   History of skin cancer 05/20/2023   Melanocytic nevi of trunk 05/20/2023   Seborrheic keratoses 05/20/2023   Squamous cell carcinoma of skin of right lower extremity 05/20/2023   Mitral regurgitation 02/22/2023   Aneurysm of ascending aorta without rupture (HCC) 02/18/2023   Cellulitis of left leg 02/18/2023   Closed comminuted fracture of left humerus 02/07/2023   Bradycardia 02/07/2023   Fall 02/07/2023   Chronic a-fib (HCC) 02/07/2023   Statin intolerance 12/31/2022   Acute pain of right knee 12/06/2022   Impetigo 10/01/2022   Insomnia 10/01/2022   Osteoporosis 06/11/2022   Weakness 04/25/2022   Weakness generalized 04/25/2022   Falls frequently 04/19/2022   Iron  deficiency anemia 12/08/2021   Skin ulcer, limited to breakdown of skin (HCC) 11/22/2021   Anemia, iron  deficiency 11/22/2021   Low serum vitamin B12 11/22/2021   Acute renal failure superimposed on stage 2 chronic kidney disease (HCC) 11/20/2021   ABLA (acute blood loss anemia) 11/20/2021   Hypotension 11/19/2021   Shortness of breath 06/12/2021   Right leg pain 02/21/2021   C. difficile colitis    Abnormal CT of the abdomen    Aortic atherosclerosis (HCC) 08/27/2020   Acute prerenal azotemia 08/27/2020   Shock circulatory (HCC) 08/27/2020   Metabolic acidosis with normal anion gap and bicarbonate losses 08/27/2020   Abdominal cramps 03/16/2020   Neurogenic orthostatic hypotension (HCC) 09/16/2019   Greater trochanteric pain syndrome of right lower extremity 07/30/2019   Abdominal pain 05/22/2018   Bradycardia with 41-50 beats per minute 12/27/2017   Right bundle branch block (RBBB) on electrocardiogram (ECG) 12/27/2017    Chronic kidney disease, stage 3b (HCC) 12/24/2017   Cellulitis of leg, right with large prepatella hematoma and open wounds 12/24/2017   Iliotibial band syndrome of right side 12/16/2017   Intervertebral lumbar disc disorder with myelopathy, lumbar region 04/25/2017   Family history of colon cancer in mother 01/22/2017   Primary osteoarthritis of both first carpometacarpal joints 01/02/2017   Pain 11/21/2016   Gastroenteritis 09/13/2016   Chronic anticoagulation 05/24/2015   Coronary artery disease involving native coronary artery of native heart with angina pectoris (HCC) 12/20/2014   Permanent atrial fibrillation (HCC) 11/21/2012   Greater trochanteric bursitis of both hips 05/21/2012   Well adult exam 11/07/2011   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   Bruising 12/18/2010   Osteoarthritis of left hip 07/03/2010   DEGENERATIVE DISC DISEASE, LUMBOSACRAL SPINE 05/18/2010   SYNCOPE 10/27/2008   Anxiety 05/21/2008   IBS 05/21/2008   Essential hypertension 06/16/2007   GERD (gastroesophageal  reflux disease) 06/16/2007    PCP: Genia Kettering, MD   REFERRING PROVIDER: Darryll Eng, NP   REFERRING DIAG:  Diagnosis  M54.16 (ICD-10-CM) - Lumbar radiculopathy    Rationale for Evaluation and Treatment: Rehabilitation  THERAPY DIAG:  Other low back pain  Muscle weakness  Difficulty in walking, not elsewhere classified  Pain in right hip  Chronic pain of right knee  ONSET DATE: over a year c worsening   SUBJECTIVE:                                                                                                                                                                                           SUBJECTIVE STATEMENT: Pt reporting 5/10 pain in her low back. Pt stating today will be her last visit since she has a personal trainer close to her house she will work out at her gym.      PERTINENT HISTORY:  Bradycardia 41-50 beats per minute, CA, CKD,  cellulitis of Rt leg with large pre-patella hematoma with open wounds 12/2017, hip bursitis 2010, HSV 2025, osteopenia, left hip surgery 2010, temporary pacemaker 2018, see other PMH above  PAIN:  NPRS scale: 5/10 Pain location: right sided low back,  and Rt hip,  Pain description: achy, sharp at times reaching 9-10/10 Aggravating factors: bending, extension, sit to stand Relieving factors: sitting  PRECAUTIONS: no  WEIGHT BEARING RESTRICTIONS: No  FALLS:  Has patient fallen in last 6 months? No  LIVING ENVIRONMENT: Lives with: lives with their family and lives with their spouse Lives in: House/apartment  Has following equipment at home: None  OCCUPATION: retired  PLOF: Independent  PATIENT GOALS: perform gym activities without pain  Next MD Visit:    OBJECTIVE:   DIAGNOSTIC FINDINGS:  No updated lumbar images found  PATIENT SURVEYS:  Patient-Specific Activity Scoring Scheme  0 represents "unable to perform." 10 represents "able to perform at prior level. 0 1 2 3 4 5 6 7 8 9  10 (Date and Score)   Activity Eval  01/14/24 02/25/24    1. Take a walk  1   1  2. Stand for longer periods 3   1  3. Sit for longer periods  9 5  4.climb stairs 8 9  5.    Score 21/4=5.25 4   Total score = sum of the activity scores/number of activities Minimum detectable change (90%CI) for average score = 2 points Minimum detectable change (90%CI) for single activity score = 3 points  SCREENING FOR RED FLAGS: Bowel or bladder incontinence: No Cauda equina syndrome: No  COGNITION: Overall cognitive status: WFL normal  SENSATION: Numbness and tingling down Rt hip into Rt knee.    POSTURE:  forward head and decreased lumbar lordosis  PALPATION: TTP: lumbar paraspinals  LUMBAR ROM:   Directional Preference Assessment: Centralization: Peripheralization:   AROM eval 02/25/24  Flexion [facility]  Extension 20 c pain 24 no pain  Right lateral flexion 28 35  Left  lateral flexion 25 32  Right rotation 75% 100%   Left rotation 50% 100%   (Blank rows = not tested)  LOWER EXTREMITY ROM:       Right 01/14/24 Left 01/14/24 Rt / Left 02/25/24  Hip flexion 108 110 112 / 115  Hip extension     Hip abduction     Hip adduction     Hip internal rotation     Hip external rotation     Knee flexion     Knee extension     Ankle dorsiflexion     Ankle plantarflexion     Ankle inversion     Ankle eversion      (Blank rows = not tested)  LOWER EXTREMITY MMT:    MMT Right Eval 01/14/24 sitting Left Eval 01/14/24 sitting Rt / Left 02/25/24 sitting  Hip flexion 19.8 27.5 24.7 / 31.2  Hip extension     Hip abduction     Hip adduction     Hip internal rotation     Hip external rotation     Knee flexion 22.6 17.8 25.2 / 27.2  Knee extension 16.5 20.6 21.6 / 23.3  Ankle dorsiflexion     Ankle plantarflexion     Ankle inversion     Ankle eversion      (Blank rows = not tested)  LUMBAR SPECIAL TESTS:  01/14/24 Slump test: Positive on the Rt  FUNCTIONAL TESTS: 01/14/24  Timed up and go (TUG): 9.98 seconds UE used to push off chair 02/25/24 5 times sit to stand: 11.0 seconds no UE support TUG: 9.6 seconds c no UE support for push off  GAIT: 01/14/24 Antalgic gait pattern in clinic gym, limp noted on Rt LE due to pain 02/25/24:  Pt's trendelenburg gait has improved with only mild drop of Rt hip.                                                                                                                                                                                                                    TODAY'S TREATMENT:  DATE:  02/25/24:  Therex: Nustep: level 4 LE's and intermittent UE's x 8 minutes Standing trunk ext, elbows on the wall x 10 holding 10 sec each Standing hip extension: x 10 bil LE c UE support, slow eccentrics Standing hip abd: x  10 bil LE c UE support, slow eccentrics Seated hip abd: red TB x 10 holding 2 sec, green TB x 5  ROM and MMT performed see chart above TherActivites:  Sit to stand: x 10  TUG: see values above     01/20/24:  Therex: Nustep: level 3 LE's and intermittent UE's x 6 minutes Standing trunk ext, elbows on the wall x 10 holding 10 sec each Supine: piriformis stretch: knee to opposite shoulder x 2 bil LE holding 30 sec Supine bridge: x 10 holding 3 sec Supine SLR: x 10 bil TherActivites:  Double Leg Press: 43# 3 x 10  Manual:  IASTM to Rt IT band and glues and bilateral lumbar paraspinals and QL      PATIENT EDUCATION:  Education details: HEP, POC, discussed possibility of DN Person educated: Patient Education method: Explanation, Demonstration, Verbal cues, and Handouts Education comprehension: verbalized understanding, returned demonstration, and verbal cues required  HOME EXERCISE PROGRAM: Access Code: [email or web address] URL: [email or web address] Date: 02/25/2024 Prepared by: Jerrel Mor  Exercises - Supine Posterior Pelvic Tilt  - 1-2 x daily - 7 x weekly - 1-2 sets - 10 reps - 5 seconds hold - Supine Piriformis Stretch with Foot on Ground  - 1-2 x daily - 7 x weekly - 4 reps - 20 seconds hold - Supine March with Posterior Pelvic Tilt  - 1-2 x daily - 7 x weekly - 2 sets - 10 reps - Supine Lower Trunk Rotation  - 1-2 x daily - 7 x weekly - 4 reps - 20 seconds hold - Hooklying Single Knee to Chest Stretch  - 1-2 x daily - 7 x weekly - 4 reps - 20 seconds hold - Standing Hip Hiking  - 1-2 x daily - 7 x weekly - 10 reps - Standing Hip Extension with Counter Support  - 1-2 x daily - 7 x weekly - 2 sets - 10 reps - Standing Hip Abduction with Counter Support  - 1-2 x daily - 7 x weekly - 2 sets - 10 reps - Seated Hip Abduction/External Rotation With Resistance at Thighs  - 1-2 x daily - 7 x weekly - 2 sets - 10 reps - Standing Thoracic Extension at Wall  - 1-2 x daily -  7 x weekly - 3 sets - 5 reps - 5-10 seconds hold  ASSESSMENT:  CLINICAL IMPRESSION: Pt has currently met her STG and 4 out of 6 of her LTG's since beginning therapy. Pt has improved in her lumbar ROM as well as her strength in bilateral LE's. Due to recent Mohs surgery on her Rt lower leg she has been limited in her mobility and strengthening exercises over the last couple of weeks. Pt's gait has improved with less noticeable trendelenburg gait pattern. Pt also able to perform back extension with no pain.  Pt stating she has 2 beach trips planned and will continue to work with a Systems analyst at a gym that is close to her home. Pt  wishing to be discharged from skilled PT services and is pleased with her progress since beginning therapy. Pt's HEP was updated and thera-bands issued in both red and green for progression.       OBJECTIVE IMPAIRMENTS: difficulty walking, decreased  ROM, decreased strength, and pain.   ACTIVITY LIMITATIONS: lifting, bending, sitting, standing, stairs, and transfers  PARTICIPATION LIMITATIONS: community activity  PERSONAL FACTORS: 3+ comorbidities: see PMH above are also affecting patient's functional outcome.   REHAB POTENTIAL: Good  CLINICAL DECISION MAKING: Evolving/moderate complexity  EVALUATION COMPLEXITY: Moderate   GOALS: Goals reviewed with patient? Yes  SHORT TERM GOALS: (target date for Short term goals are 3 weeks 02/04/2024)  1. Patient will demonstrate independent use of home exercise program to maintain progress from in clinic treatments.  Goal status: MET  01/20/24  LONG TERM GOALS: (target dates for all long term goals are 10 weeks  03/24/2024 )   1. Patient will demonstrate/report pain at worst less than or equal to 2/10 to facilitate minimal limitation in daily activity secondary to pain symptoms.  Goal status: MET 02/25/24 ( pt stating it can depend on the day)  2. Patient will demonstrate independent use of home exercise program  to facilitate ability to maintain/progress functional gains from skilled physical therapy services.  Goal status: MET 02/25/24   3. Patient will demonstrate Patient specific functional scale avg > or = 7.25 to indicate reduced disability due to condition.   Goal status: Not MET 02/25/24 ( pt had a skin cancer removed on her leg and it has limited her activity level)   4. Patient will demonstrate lumbar extension with  pain </=3/10 WFL.   Goal status: MET 02/25/24   5.  Pt will be able to navigate 1 flight of stairs with single hand rail reciprocal gait pattern safely with hip pain <= 3/10.  Goal status: MET 02/25/24   6.  Pt will improve her bil LE strength by 5 ppsi in hip flexion, knee flexion and knee extension bilaterally.  Goal status: Partially Met 02/25/24      PLAN:  PT FREQUENCY: 1-2x/week  PT DURATION: 10 weeks  PLANNED INTERVENTIONS: Can include 65784- PT Re-evaluation, 97110-Therapeutic exercises, 97530- Therapeutic activity, 97112- Neuromuscular re-education, 97535- Self Care, 97140- Manual therapy, [phone]- Gait training, [phone]- Orthotic Fit/training, [phone]- Canalith repositioning, [id number]- Aquatic Therapy, [phone]- Electrical stimulation (unattended), 97750 Physical performance testing, [id number]- Electrical stimulation (manual), 97016- Vasopneumatic device, [id number]- Ultrasound, [id number]- Traction (mechanical), [id number]- Ionotophoresis 4mg /ml Dexamethasone , Patient/Family education, Balance training, Stair training, Taping, Dry Needling, Joint mobilization, Joint manipulation, Spinal manipulation, Spinal mobilization, Scar mobilization, Vestibular training, Visual/preceptual remediation/compensation, DME instructions, Cryotherapy, and Moist heat.  All performed as medically necessary.  All included unless contraindicated  PLAN FOR NEXT SESSION: DISCHARGE   Marysue Sola, PT, MPT 02/25/2024, 11:18 AM  PHYSICAL THERAPY DISCHARGE SUMMARY  Visits from Start of Care: 3  Current functional  level related to goals / functional outcomes: See above   Remaining deficits: See above   Education / Equipment: HEP   Patient agrees to discharge. Patient goals were partially met. Patient is being discharged due to being pleased with the current functional level.     Date of referral:   Referring provider: Darryll Eng, NP  Referring diagnosis? 12/26/23 Treatment diagnosis? (if different than referring diagnosis) M54.16 (ICD-10-CM) - Lumbar radiculopathy  What was this (referring dx) caused by? Ongoing Issue  Lonne Roan of Condition: Initial Onset (within last 3 months)   Laterality: Rt  Current Functional Measure Score: Other functional specific patient activity scale  Objective measurements identify impairments when they are compared to normal values, the uninvolved extremity, and prior level of function.  [x]  Yes  []  No  Objective assessment of functional ability: Minimal functional limitations  Briefly describe symptoms: pain in Rt side low back, radiating down Rt hip and anterior thigh, Pt also reporting Rt knee pain  How did symptoms start: on-going issue worsening over the last year  Average pain intensity:  Last 24 hours: 8/10 at times  Past week: 9/10 at times  How often does the pt experience symptoms? Frequently  How much have the symptoms interfered with usual daily activities? Moderately  How has condition changed since care began at this facility? NA - initial visit  In general, how is the patients overall health? Very Good   BACK PAIN (STarT Back Screening Tool) Has pain spread down the leg(s) at some time in the last 2 weeks? yes Has there been pain in the shoulder or neck at some time in the last 2 weeks? yes Has the pt only walked short distances because of back pain? yes Has patient dressed more slowly because of back pain in the past 2 weeks? no Does patient think it's not safe for a person with this condition to be physically active?  no Does patient have worrying thoughts a lot of the time? Yes at times Does patient feel back pain is terrible and will never get any better? probably Has patient stopped enjoying things they usually enjoy? yes

## 2024-02-26 ENCOUNTER — Other Ambulatory Visit (INDEPENDENT_AMBULATORY_CARE_PROVIDER_SITE_OTHER)

## 2024-02-26 DIAGNOSIS — E538 Deficiency of other specified B group vitamins: Secondary | ICD-10-CM

## 2024-02-26 DIAGNOSIS — Z5189 Encounter for other specified aftercare: Secondary | ICD-10-CM | POA: Diagnosis not present

## 2024-02-26 DIAGNOSIS — D500 Iron deficiency anemia secondary to blood loss (chronic): Secondary | ICD-10-CM

## 2024-02-26 DIAGNOSIS — I10 Essential (primary) hypertension: Secondary | ICD-10-CM

## 2024-02-26 DIAGNOSIS — E559 Vitamin D deficiency, unspecified: Secondary | ICD-10-CM | POA: Diagnosis not present

## 2024-02-26 DIAGNOSIS — Z0000 Encounter for general adult medical examination without abnormal findings: Secondary | ICD-10-CM

## 2024-02-26 LAB — LIPID PANEL
Cholesterol: 157 mg/dL (ref 0–200)
HDL: 84.1 mg/dL (ref >39.00)
LDL Cholesterol: 60 mg/dL (ref 0–99)
NonHDL: 72.92
Total CHOL/HDL Ratio: 2
Triglycerides: 64 mg/dL (ref 0.0–149.0)
VLDL: 12.8 mg/dL (ref 0.0–40.0)

## 2024-02-26 LAB — COMPREHENSIVE METABOLIC PANEL WITH GFR
ALT: 8 U/L (ref 0–35)
AST: 14 U/L (ref 0–37)
Albumin: 4.1 g/dL (ref 3.5–5.2)
Alkaline Phosphatase: 52 U/L (ref 39–117)
BUN: 29 mg/dL — ABNORMAL HIGH (ref 6–23)
CO2: 27 meq/L (ref 19–32)
Calcium: 9.2 mg/dL (ref 8.4–10.5)
Chloride: 103 meq/L (ref 96–112)
Creatinine, Ser: 0.99 mg/dL (ref 0.40–1.20)
GFR: 51.36 mL/min — ABNORMAL LOW (ref >60.00)
Glucose, Bld: 89 mg/dL (ref 70–99)
Potassium: 4.5 meq/L (ref 3.5–5.1)
Sodium: 136 meq/L (ref 135–145)
Total Bilirubin: 0.9 mg/dL (ref 0.2–1.2)
Total Protein: 6.5 g/dL (ref 6.0–8.3)

## 2024-02-26 LAB — URINALYSIS, ROUTINE W REFLEX MICROSCOPIC
Bilirubin Urine: NEGATIVE
Hgb urine dipstick: NEGATIVE
Ketones, ur: NEGATIVE
Nitrite: NEGATIVE
Specific Gravity, Urine: 1.015 (ref 1.000–1.030)
Total Protein, Urine: NEGATIVE
Urine Glucose: >=1000 — AB
Urobilinogen, UA: 0.2 (ref 0.0–1.0)
pH: 6 (ref 5.0–8.0)

## 2024-02-26 LAB — CBC WITH DIFFERENTIAL/PLATELET
Basophils Absolute: 0 10*3/uL (ref 0.0–0.1)
Basophils Relative: 0.4 % (ref 0.0–3.0)
Eosinophils Absolute: 0.2 10*3/uL (ref 0.0–0.7)
Eosinophils Relative: 3.9 % (ref 0.0–5.0)
HCT: 38.1 % (ref 36.0–46.0)
Hemoglobin: 12.8 g/dL (ref 12.0–15.0)
Lymphocytes Relative: 21.6 % (ref 12.0–46.0)
Lymphs Abs: 1.3 10*3/uL (ref 0.7–4.0)
MCHC: 33.6 g/dL (ref 30.0–36.0)
MCV: 95.4 fl (ref 78.0–100.0)
Monocytes Absolute: 0.7 10*3/uL (ref 0.1–1.0)
Monocytes Relative: 11.7 % (ref 3.0–12.0)
Neutro Abs: 3.8 10*3/uL (ref 1.4–7.7)
Neutrophils Relative %: 62.4 % (ref 43.0–77.0)
Platelets: 270 10*3/uL (ref 150.0–400.0)
RBC: 4 Mil/uL (ref 3.87–5.11)
RDW: 13.8 % (ref 11.5–15.5)
WBC: 6.2 10*3/uL (ref 4.0–10.5)

## 2024-02-26 LAB — VITAMIN B12: Vitamin B-12: 594 pg/mL (ref 211–911)

## 2024-02-26 LAB — VITAMIN D 25 HYDROXY (VIT D DEFICIENCY, FRACTURES): VITD: 53.95 ng/mL (ref 30.00–100.00)

## 2024-02-26 LAB — TSH: TSH: 3.49 u[IU]/mL (ref 0.35–5.50)

## 2024-02-27 ENCOUNTER — Ambulatory Visit: Admitting: Orthopedic Surgery

## 2024-02-27 ENCOUNTER — Ambulatory Visit: Payer: Self-pay | Admitting: Internal Medicine

## 2024-02-28 ENCOUNTER — Encounter: Payer: Self-pay | Admitting: Physical Medicine and Rehabilitation

## 2024-02-28 ENCOUNTER — Ambulatory Visit: Admitting: Physical Medicine and Rehabilitation

## 2024-02-28 DIAGNOSIS — G8929 Other chronic pain: Secondary | ICD-10-CM | POA: Diagnosis not present

## 2024-02-28 DIAGNOSIS — M5416 Radiculopathy, lumbar region: Secondary | ICD-10-CM | POA: Diagnosis not present

## 2024-02-28 DIAGNOSIS — M5441 Lumbago with sciatica, right side: Secondary | ICD-10-CM

## 2024-02-28 DIAGNOSIS — M48061 Spinal stenosis, lumbar region without neurogenic claudication: Secondary | ICD-10-CM

## 2024-02-28 NOTE — Progress Notes (Unsigned)
 Hailey [name] - 89 y.o. female MRN [id number]  Date of birth: Oct 08, 1934  Office Visit Note: Visit Date: 02/28/2024 PCP: Genia Kettering, MD Referred by: Genia Kettering, MD  Subjective: Chief Complaint  Patient presents with   Lower Back - Pain   HPI: Hailey [name] is a 89 y.o. female who comes in today for evaluation of chronic, worsening and severe right sided lower back pain radiating to buttock and down right lateral leg to knee. Her pain worsens with prolonged sitting and standing. Her pain improves with activity and walking. She describes pain as sharp and shooting sensation, currently rates as 8 out of 10. Some relief of pain with home exercise regimen, rest and use of medications. Patient is currently working with a Event organiser several times a week to improve balance, posture and strength. Patient has completed regimen of formal physical therapy with in the past for more balance/gait issues. Lumbar MRI imaging from 2023 shows scoliotic thoracolumbar curvature, severe right and mild left foraminal stenosis at L4-L5. There is advanced facet arthropathy at L4-L5. No high grade spinal canal stenosis noted. Patient has undergone multiple lumbar epidural steroid injections, difficult to tell if these injections are helping her. Some have provided short term relief of pain. Bilateral L4 transforaminal epidural steroid injection in our office on 11/04/2023 provided greater than 50% relief of pain for about 1 month. More recently she underwent right greater trochanter injection on 12/12/2023 with no relief of pain. Patient denies focal weakness, numbness and tingling. No recent trauma or falls.      Review of Systems  Musculoskeletal:  Positive for back pain.  Neurological:  Negative for tingling, sensory change, focal weakness and weakness.  All other systems reviewed and are negative.  Otherwise per HPI.  Assessment & Plan: Visit Diagnoses:    ICD-10-CM   1. Chronic  bilateral low back pain with right-sided sciatica  G89.29 Ambulatory referral to Physical Medicine Rehab   M54.41     2. Lumbar radiculopathy  M54.16 Ambulatory referral to Physical Medicine Rehab    3. Foraminal stenosis of lumbar region  M48.061 Ambulatory referral to Physical Medicine Rehab       Plan: Findings:  Chronic, worsening and severe right sided lower back pain radiating to buttock and down right lateral leg to knee.  Patient continues to have severe pain despite good conservative therapy such as formal physical therapy, home exercise regimen, rest and use of medications.  Patient's clinical presentation and exam are consistent with lumbar radiculopathy, more of L5 nerve distribution.  We discussed treatment plan in detail today.  Next step is to perform diagnostic and hopefully therapeutic right L5 transforaminal epidural steroid injection.  If good relief of pain with this injection we can repeat infrequently as needed.  Should her pain persist we discussed other treatment options such as obtaining new lumbar MRI imaging.  Patient voiced to me today that she will be leaving to go out of town for several weeks, she would like to have her injection done next week.  Unfortunately, we are booked several weeks out at this point for injections.  We will try to accommodate her as quickly as possible.  No red flag symptoms noted upon exam today.    Meds & Orders: No orders of the defined types were placed in this encounter.   Orders Placed This Encounter  Procedures   Ambulatory referral to Physical Medicine Rehab    Follow-up: Return for Right L5 transforaminal epidural  steroid injection.   Procedures: No procedures performed      Clinical History: EXAM: MRI LUMBAR SPINE WITHOUT CONTRAST   TECHNIQUE: Multiplanar, multisequence MR imaging of the lumbar spine was performed. No intravenous contrast was administered.   COMPARISON:  MR lumbar 03/20/2017; X-ray lumbar 09/14/2021.    FINDINGS: Segmentation:  Standard.   Alignment: Scoliotic thoracolumbar curvature. Mild retrolisthesis at L1-2, L2-3, and L3-4.   Vertebrae: No fracture, evidence of discitis, or suspicious bone lesion. Scattered intraosseous hemangiomas.   Conus medullaris and cauda equina: Conus extends to the L1 level. Conus and cauda equina appear normal.   Paraspinal and other soft tissues: No acute findings.   Disc levels:   T12-L1: Mild disc bulge. No foraminal or canal stenosis. Unchanged.   L1-L2: Disc height loss with mild disc bulge and endplate ridging. Mild left foraminal stenosis. No canal stenosis. Unchanged.   L2-L3: Retrolisthesis. Diffuse disc bulge with small caudally extending right paracentral disc extrusion. Mild bilateral facet arthropathy. Mild canal stenosis with moderate bilateral foraminal stenosis. Findings have progressed from prior.   L3-L4: Diffuse disc bulge with mild bilateral facet arthropathy and ligamentum flavum buckling. Findings result in mild-moderate canal stenosis with moderate right foraminal stenosis. Findings progressed from prior.   L4-L5: Disc bulge with endplate osteophytic ridging. Advanced bilateral facet arthropathy. Severe right and mild left foraminal stenosis. No significant interval progression.   L5-S1: Disc height loss and endplate ridging. Mild facet hypertrophy. No canal stenosis. Mild-to-moderate left and mild right foraminal stenosis. Unchanged.   IMPRESSION: 1. Multilevel lumbar spondylosis, slightly progressed from prior MRI. 2. Mild-moderate canal stenosis with moderate right foraminal stenosis at L3-4. 3. Mild canal stenosis and moderate bilateral foraminal stenosis at L2-3. 4. Severe right and mild left foraminal stenosis at L4-5.     Electronically Signed   By: Leverne Reading D.O.   On: 11/29/2021 13:23   She reports that she has never smoked. She has never used smokeless tobacco. No results for input(s):  HGBA1C, LABURIC in the last 8760 hours.  Objective:  VS:  HT:    WT:   BMI:     BP:   HR: bpm  TEMP: ( )  RESP:  Physical Exam Vitals and nursing note reviewed.  HENT:     Head: Normocephalic and atraumatic.     Right Ear: External ear normal.     Left Ear: External ear normal.     Nose: Nose normal.     Mouth/Throat:     Mouth: Mucous membranes are moist.   Eyes:     Extraocular Movements: Extraocular movements intact.    Cardiovascular:     Rate and Rhythm: Normal rate.     Pulses: Normal pulses.  Pulmonary:     Effort: Pulmonary effort is normal.  Abdominal:     General: Abdomen is flat. There is no distension.   Musculoskeletal:        General: Tenderness present.     Cervical back: Normal range of motion.     Comments: Patient rises from seated position to standing without difficulty. Good lumbar range of motion. No pain noted with facet loading. 5/5 strength noted with bilateral hip flexion, knee flexion/extension, ankle dorsiflexion/plantarflexion and EHL. No clonus noted bilaterally. No pain upon palpation of greater trochanters. No pain with internal/external rotation of bilateral hips. Sensation intact bilaterally. Dysesthesias noted to right L5 dermatome. Negative slump test bilaterally. Ambulates without aid, gait steady.      Skin:    General: Skin is  warm and dry.     Capillary Refill: Capillary refill takes less than 2 seconds.   Neurological:     General: No focal deficit present.     Mental Status: She is alert and oriented to person, place, and time.   Psychiatric:        Mood and Affect: Mood normal.        Behavior: Behavior normal.     Ortho Exam  Imaging: No results found.  Past Medical/Family/Surgical/Social History: Medications & Allergies reviewed per EMR, new medications updated. Patient Active Problem List   Diagnosis Date Noted   Clostridioides difficile infection 10/22/2023   Supraclavicular fossa fullness 09/24/2023    COVID-19 09/24/2023   Cough 06/28/2023   Degenerative tear of left medial meniscus 06/20/2023   History of skin cancer 05/20/2023   Melanocytic nevi of trunk 05/20/2023   Seborrheic keratoses 05/20/2023   Squamous cell carcinoma of skin of right lower extremity 05/20/2023   Mitral regurgitation 02/22/2023   Aneurysm of ascending aorta without rupture (HCC) 02/18/2023   Cellulitis of left leg 02/18/2023   Closed comminuted fracture of left humerus 02/07/2023   Bradycardia 02/07/2023   Fall 02/07/2023   Chronic a-fib (HCC) 02/07/2023   Statin intolerance 12/31/2022   Acute pain of right knee 12/06/2022   Impetigo 10/01/2022   Insomnia 10/01/2022   Osteoporosis 06/11/2022   Weakness 04/25/2022   Weakness generalized 04/25/2022   Falls frequently 04/19/2022   Iron  deficiency anemia 12/08/2021   Skin ulcer, limited to breakdown of skin (HCC) 11/22/2021   Anemia, iron  deficiency 11/22/2021   Low serum vitamin B12 11/22/2021   Acute renal failure superimposed on stage 2 chronic kidney disease (HCC) 11/20/2021   ABLA (acute blood loss anemia) 11/20/2021   Hypotension 11/19/2021   Shortness of breath 06/12/2021   Right leg pain 02/21/2021   C. difficile colitis    Abnormal CT of the abdomen    Aortic atherosclerosis (HCC) 08/27/2020   Acute prerenal azotemia 08/27/2020   Shock circulatory (HCC) 08/27/2020   Metabolic acidosis with normal anion gap and bicarbonate losses 08/27/2020   Abdominal cramps 03/16/2020   Neurogenic orthostatic hypotension (HCC) 09/16/2019   Greater trochanteric pain syndrome of right lower extremity 07/30/2019   Abdominal pain 05/22/2018   Bradycardia with 41-50 beats per minute 12/27/2017   Right bundle branch block (RBBB) on electrocardiogram (ECG) 12/27/2017   Chronic kidney disease, stage 3b (HCC) 12/24/2017   Cellulitis of leg, right with large prepatella hematoma and open wounds 12/24/2017   Iliotibial band syndrome of right side 12/16/2017    Intervertebral lumbar disc disorder with myelopathy, lumbar region 04/25/2017   Family history of colon cancer in mother 01/22/2017   Primary osteoarthritis of both first carpometacarpal joints 01/02/2017   Pain 11/21/2016   Gastroenteritis 09/13/2016   Chronic anticoagulation 05/24/2015   Coronary artery disease involving native coronary artery of native heart with angina pectoris (HCC) 12/20/2014   Permanent atrial fibrillation (HCC) 11/21/2012   Greater trochanteric bursitis of both hips 05/21/2012   Well adult exam 11/07/2011   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   Bruising 12/18/2010   Osteoarthritis of left hip 07/03/2010   DEGENERATIVE DISC DISEASE, LUMBOSACRAL SPINE 05/18/2010   SYNCOPE 10/27/2008   Anxiety 05/21/2008   IBS 05/21/2008   Essential hypertension 06/16/2007   GERD (gastroesophageal reflux disease) 06/16/2007   Past Medical History:  Diagnosis Date   Acute blood loss anemia 12/25/2017   Allergic rhinitis    Anxiety    Arthritis  my whole spine (07/01/2017)   Arthritis    Atrial fibrillation (HCC)    Bowel obstruction (HCC)    in [place]    Bradycardia with 41-50 beats per minute 12/27/2017   Cancer (HCC)    Cellulitis of leg, right with large prepatella hematoma and open wounds 12/26/2017   CKD (chronic kidney disease) stage 2, GFR 60-89 ml/min    Colon polyps    Coronary artery disease    10/18 PCI/DES to p/m LCx with cutting balloon to mLcx   Diverticulosis of colon    GERD (gastroesophageal reflux disease)    Hip bursitis 2010   Dr Watson Hacking, Post op seroma   History of colon polyps    HSV (herpes simplex virus) anogenital infection 07/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   HTN (hypertension)    IBS (irritable bowel syndrome)    constipation predominant - Dr Andriette Keeling   Lichen sclerosus    Osteopenia 11/2016   T score -2.0 FRAX 15%/4.3%   PAC (premature atrial contraction)    Symptomatiic   Renal insufficiency    Right bundle branch block  (RBBB) on electrocardiogram (ECG) 12/27/2017   Scoliosis    SVT (supraventricular tachycardia) (HCC)    brief history   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   biopsy showing vulvar atypia, possible VIN I   Family History  Problem Relation Age of Onset   Colon cancer Mother        Dx age 64, died at age 29   Diabetes Father    Prostate cancer Father    Prostate cancer Brother    Pancreatic cancer Brother    Stomach cancer Son    Past Surgical History:  Procedure Laterality Date   ANTERIOR AND POSTERIOR VAGINAL REPAIR  01/2002   Maximo Spar 01/23/2011   APPENDECTOMY  1948   CARDIAC CATHETERIZATION  06/26/2017   CORONARY ANGIOPLASTY WITH STENT PLACEMENT  07/01/2017   CORONARY ATHERECTOMY N/A 07/01/2017   Procedure: CORONARY ATHERECTOMY;  Surgeon: Arty Binning, MD;  Location: [facility];  Service: Cardiovascular;  Laterality: N/A;   CORONARY STENT INTERVENTION N/A 07/01/2017   Procedure: CORONARY STENT INTERVENTION;  Surgeon: Arty Binning, MD;  Location: [facility];  Service: Cardiovascular;  Laterality: N/A;   HAMMER TOE SURGERY     HEMORRHOID BANDING     HIP SURGERY Left 04/2009   hip examination under anesthesia followed by greater trochanteric bursectomy; iliotibial band tenotomy/notes 01/20/2011   I & D EXTREMITY Right 01/10/2018   Procedure: IRRIGATION AND DEBRIDEMENT RIGHT KNEE, APPLY WOUND VAC;  Surgeon: Timothy Ford, MD;  Location: [facility] OR;  Service: Orthopedics;  Laterality: Right;   KNEE BURSECTOMY Right 04/2009   Maximo Spar 01/09/2011   LEFT ATRIAL APPENDAGE OCCLUSION N/A 08/24/2021   Procedure: LEFT ATRIAL APPENDAGE OCCLUSION;  Surgeon: Arnoldo Lapping, MD;  Location: [facility] INV[facility] CV LAB;  Service: Cardiovascular;  Laterality: N/A;   LEFT HEART CATH AND CORONARY ANGIOGRAPHY N/A 06/26/2017   Procedure: LEFT HEART CATH AND CORONARY ANGIOGRAPHY;  Surgeon: Arty Binning, MD;  Location: [facility];  Service: Cardiovascular;  Laterality: N/A;    PUBOVAGINAL SLING  01/2002   Maximo Spar 01/23/2011   REDUCTION MAMMAPLASTY     TEE WITHOUT CARDIOVERSION N/A 08/24/2021   Procedure: TRANSESOPHAGEAL ECHOCARDIOGRAM (TEE);  Surgeon: Arnoldo Lapping, MD;  Location: [facility] INVASIVE CV LAB;  Service: Cardiovascular;  Laterality: N/A;   TEMPORARY PACEMAKER N/A 07/01/2017   Procedure: TEMPORARY PACEMAKER;  Surgeon: Felipe Horton,  Durenda Gilford, MD;  Location: [facility];  Service: Cardiovascular;  Laterality: N/A;   VAGINAL HYSTERECTOMY  01/2002   Vaginal hysterectomy, bilateral salpingo-oophorectomy/notes 01/23/2011   Social History   Occupational History   Occupation: Engineer, manufacturing systems: RETIRED  Tobacco Use   Smoking status: Never   Smokeless tobacco: Never  Vaping Use   Vaping status: Never Used  Substance and Sexual Activity   Alcohol use: Yes    Comment: moderatley [place] before dinner   Drug use: Never   Sexual activity: Not Currently    Birth control/protection: Surgical    Comment: hysterectomy; less than 5, IC after 18, no STD, no abnormal paps

## 2024-02-28 NOTE — Progress Notes (Unsigned)
 Pain Scale   Average Pain 5 Patient advising she has chronic lower back pain radiating to her Right leg area.        +Driver, -BT, -Dye Allergies.

## 2024-03-16 ENCOUNTER — Telehealth: Payer: Self-pay | Admitting: Physical Medicine and Rehabilitation

## 2024-03-16 ENCOUNTER — Other Ambulatory Visit: Payer: Self-pay | Admitting: Physical Medicine and Rehabilitation

## 2024-03-16 DIAGNOSIS — M5416 Radiculopathy, lumbar region: Secondary | ICD-10-CM

## 2024-03-16 DIAGNOSIS — G8929 Other chronic pain: Secondary | ICD-10-CM

## 2024-03-16 NOTE — Telephone Encounter (Signed)
 Patient called and is asking for a referral for PT in [facility]. CB#[phone]

## 2024-03-24 DIAGNOSIS — M5459 Other low back pain: Secondary | ICD-10-CM | POA: Diagnosis not present

## 2024-03-26 ENCOUNTER — Ambulatory Visit: Payer: Self-pay

## 2024-03-26 NOTE — Telephone Encounter (Signed)
 FYI Only or Action Required?: Action required by provider: clinical question for provider and update on patient condition.  Patient was last seen in primary care on 02/19/2024 by Plotnikov, [name], MD.  Called Nurse Triage reporting GI Problem.  Symptoms began 10 days ago.  Interventions attempted: Prescription medications: zofran .  Symptoms are: gradually improving.  Triage Disposition: See Physician Within 24 Hours  Patient/caregiver understands and will follow disposition?: No, wishes to speak with PCP  -[place] is out [place]             Patient is out of town and has been struggling with lose bowels, cramping, and vomiting.   Reason for Disposition  [1] MILD pain (e.g., does not interfere with normal activities) AND [2] pain comes and goes (cramps) AND [3] present > 48 hours  (Exception: This same abdominal pain is a chronic symptom recurrent or ongoing AND present > 4 weeks.)  Answer Assessment - Initial Assessment Questions 1. LOCATION: Where does it hurt?      Lower abd 2. RADIATION: Does the pain shoot anywhere else? (e.g., chest, back)     no 3. ONSET: When did the pain begin? (e.g., minutes, hours or days ago)      10 days 4. SUDDEN: Gradual or sudden onset?     gradual 5. PATTERN Does the pain come and go, or is it constant?     Comes and goes 6. SEVERITY: How bad is the pain?  (e.g., Scale 1-10; mild, moderate, or severe)     4/10 7. RECURRENT SYMPTOM: Have you ever had this type of stomach pain before? If Yes, ask: When was the last time? and What happened that time?      no 8. CAUSE: What do you think is causing the stomach pain? (e.g., gallstones, recent abdominal surgery)     unknown 9. RELIEVING/AGGRAVATING FACTORS: What makes it better or worse? (e.g., antacids, bending or twisting motion, bowel movement)     no 10. OTHER SYMPTOMS: Do you have any other symptoms? (e.g., back pain, diarrhea, fever, urination pain,  vomiting)       Lose stool and vomiting  Protocols used: Abdominal Pain - Female-A-AH

## 2024-03-30 NOTE — Telephone Encounter (Signed)
 Does Hailey [name] need to go to an urgent care? Do I need to call something in for her? Thanks

## 2024-03-31 NOTE — Telephone Encounter (Signed)
 Spoke with the pt and she has stated she believes it was a bad peach that her and her husband ate from a PEACH stand at the stand.  Pt asked that something be sent in to help prevent this happening again. Pt asked it be sent to the [facility] that is on file.

## 2024-04-01 ENCOUNTER — Telehealth: Payer: Self-pay | Admitting: Internal Medicine

## 2024-04-01 NOTE — Telephone Encounter (Unsigned)
 Copied from CRM [id number]. Topic: General - Other >> Apr 01, 2024 10:30 AM Hailey [name] wrote: Reason for CRM: Patient called in stating she would like Dr. Garald nurse Earnie to call to provide new symptoms she's having. Please call [phone]

## 2024-04-03 DIAGNOSIS — M5459 Other low back pain: Secondary | ICD-10-CM | POA: Diagnosis not present

## 2024-04-03 NOTE — Telephone Encounter (Signed)
 Tried calling the pt in regards to her concerns and changes with her sxs.  Please have pt triaged upon her call back.

## 2024-04-06 ENCOUNTER — Other Ambulatory Visit: Payer: Self-pay | Admitting: Physical Medicine and Rehabilitation

## 2024-04-06 ENCOUNTER — Telehealth: Payer: Self-pay | Admitting: Physical Medicine and Rehabilitation

## 2024-04-06 MED ORDER — HYDROCODONE-ACETAMINOPHEN 5-325 MG PO TABS: 1.0000 | ORAL_TABLET | Freq: Three times a day (TID) | ORAL | 0 refills | Status: DC | PRN

## 2024-04-06 NOTE — Telephone Encounter (Signed)
 Patient called and said she is hurting so bad she can barely walk. Also if she can send her something for pain. [place]. CB#[phone]

## 2024-04-08 DIAGNOSIS — M5459 Other low back pain: Secondary | ICD-10-CM | POA: Diagnosis not present

## 2024-04-11 ENCOUNTER — Other Ambulatory Visit: Payer: Self-pay | Admitting: Internal Medicine

## 2024-04-13 ENCOUNTER — Telehealth: Payer: Self-pay | Admitting: Internal Medicine

## 2024-04-13 ENCOUNTER — Other Ambulatory Visit: Payer: Self-pay

## 2024-04-13 MED ORDER — CIPROFLOXACIN HCL 500 MG PO TABS: 500.0000 mg | ORAL_TABLET | Freq: Two times a day (BID) | ORAL | 0 refills | Status: DC

## 2024-04-13 MED ORDER — DICYCLOMINE HCL 20 MG PO TABS: ORAL_TABLET | ORAL | 1 refills | Status: AC

## 2024-04-13 NOTE — Telephone Encounter (Signed)
 Patient is calling because she feeling like she has Diverticulitis. Patient stated that she has been having on and off symptoms. Patient also stated that she is currently at [facility] and requesting for the nurse to call her back today if all possible. Please advise.

## 2024-04-13 NOTE — Telephone Encounter (Signed)
 1.  Cipro  500 mg p.o. twice daily x 5 days (confirm no allergies) 2.  Dicyclomine  20 mg.  Dispense 30.  1 refill.  Take 1 p.o. every 4-6 hours as needed cramping 3.  Brat diet.  Advance as tolerated 4.  Hydrate well and rest

## 2024-04-13 NOTE — Telephone Encounter (Signed)
Pt aware, scripts sent to pharmacy. °

## 2024-04-13 NOTE — Telephone Encounter (Signed)
 Pt out of town at [facility]. States for over a week now she has been having symptoms of what she thought might be diverticulitis. States for over a week she was having constipation then she had vomiting and then diarrhea. She is no longer constipated and reports she is having a BM every 1/2 hour and the stool looks like little worms. She has stomach cramps off and on and has a spot that is sore to touch on her right side. Reports no fever. Pt wants to know what Dr Abran recommends. Please advise.

## 2024-04-20 ENCOUNTER — Encounter: Payer: Self-pay | Admitting: Internal Medicine

## 2024-04-20 DIAGNOSIS — R109 Unspecified abdominal pain: Secondary | ICD-10-CM

## 2024-04-20 DIAGNOSIS — R197 Diarrhea, unspecified: Secondary | ICD-10-CM

## 2024-04-20 NOTE — Telephone Encounter (Signed)
 Yes, additional testing as follows: 1.  CBC, comprehensive metabolic panel, lipase 2.  Stool studies for enteric pathogens 3.  Contrast CT scan of the abdomen pelvis abdominal pain.  ASAP 4.  Have her see any of the advanced practitioners after the above is completed (hopefully within the next week or less). 5.  Also, my next opening is the afternoon of August 25.  Put her in for an appointment with me on that day, in case it is needed. Thanks, Dr. Abran

## 2024-04-20 NOTE — Telephone Encounter (Signed)
 You are correct because I am ERCP backup. Looks like my next opening is September 17.  Give her that appointment, just in case it is needed

## 2024-04-20 NOTE — Telephone Encounter (Signed)
 Lab orders in epic. [facility] GI panel with C. Diff order in epic, kit and collection instructions placed at 2nd floor front desk. STAT CT order in epic, urgent message sent to radiology scheduling to contact patient to set up a STAT CTAP. Scheduled to see Alan, [place] on 04/27/24 at 8:40 am.

## 2024-04-21 ENCOUNTER — Ambulatory Visit (HOSPITAL_COMMUNITY)
Admission: RE | Admit: 2024-04-21 | Discharge: 2024-04-21 | Disposition: A | Discharge status: Home / Self Care | Source: Ambulatory Visit | Attending: Internal Medicine | Admitting: Internal Medicine

## 2024-04-21 ENCOUNTER — Ambulatory Visit: Payer: Self-pay | Admitting: Internal Medicine

## 2024-04-21 ENCOUNTER — Other Ambulatory Visit (INDEPENDENT_AMBULATORY_CARE_PROVIDER_SITE_OTHER)

## 2024-04-21 DIAGNOSIS — K8689 Other specified diseases of pancreas: Secondary | ICD-10-CM | POA: Diagnosis not present

## 2024-04-21 DIAGNOSIS — K5730 Diverticulosis of large intestine without perforation or abscess without bleeding: Secondary | ICD-10-CM | POA: Diagnosis not present

## 2024-04-21 DIAGNOSIS — R109 Unspecified abdominal pain: Secondary | ICD-10-CM | POA: Insufficient documentation

## 2024-04-21 LAB — COMPREHENSIVE METABOLIC PANEL WITH GFR
ALT: 9 U/L (ref 0–35)
AST: 17 U/L (ref 0–37)
Albumin: 4 g/dL (ref 3.5–5.2)
Alkaline Phosphatase: 57 U/L (ref 39–117)
BUN: 16 mg/dL (ref 6–23)
CO2: 27 meq/L (ref 19–32)
Calcium: 9.2 mg/dL (ref 8.4–10.5)
Chloride: 104 meq/L (ref 96–112)
Creatinine, Ser: 0.96 mg/dL (ref 0.40–1.20)
GFR: 53.24 mL/min — ABNORMAL LOW (ref >60.00)
Glucose, Bld: 93 mg/dL (ref 70–99)
Potassium: 4.2 meq/L (ref 3.5–5.1)
Sodium: 139 meq/L (ref 135–145)
Total Bilirubin: 0.5 mg/dL (ref 0.2–1.2)
Total Protein: 6.6 g/dL (ref 6.0–8.3)

## 2024-04-21 LAB — CBC WITH DIFFERENTIAL/PLATELET
Basophils Absolute: 0 K/uL (ref 0.0–0.1)
Basophils Relative: 0.5 % (ref 0.0–3.0)
Eosinophils Absolute: 0.3 K/uL (ref 0.0–0.7)
Eosinophils Relative: 6.8 % — ABNORMAL HIGH (ref 0.0–5.0)
HCT: 38.2 % (ref 36.0–46.0)
Hemoglobin: 12.6 g/dL (ref 12.0–15.0)
Lymphocytes Relative: 21.1 % (ref 12.0–46.0)
Lymphs Abs: 1.1 K/uL (ref 0.7–4.0)
MCHC: 33 g/dL (ref 30.0–36.0)
MCV: 97 fl (ref 78.0–100.0)
Monocytes Absolute: 0.6 K/uL (ref 0.1–1.0)
Monocytes Relative: 11.8 % (ref 3.0–12.0)
Neutro Abs: 3 K/uL (ref 1.4–7.7)
Neutrophils Relative %: 59.8 % (ref 43.0–77.0)
Platelets: 284 K/uL (ref 150.0–400.0)
RBC: 3.94 Mil/uL (ref 3.87–5.11)
RDW: 14.5 % (ref 11.5–15.5)
WBC: 5.1 K/uL (ref 4.0–10.5)

## 2024-04-21 LAB — LIPASE: Lipase: 34 U/L (ref 11.0–59.0)

## 2024-04-21 MED ORDER — IOHEXOL 300 MG/ML  SOLN
100.0000 mL | Freq: Once | INTRAMUSCULAR | Status: AC | PRN
  Administered 2024-04-21 (×2): 100 mL via INTRAVENOUS

## 2024-04-24 NOTE — Progress Notes (Unsigned)
 04/27/2024 Hailey [name] [id number] 1934-12-13  Referring provider: Garald [name], MD Primary GI doctor: Dr. Abran  ASSESSMENT AND PLAN:  Abdominal pain, diarrhea, vomiting Potentially secondary to lactate use two separate occasions, then a week later developed diarrhea 3 x a day, Ab pain, nausea x 5 days  04/13/2024 sent in Cipro  500 mg twice daily for 5 days, dicyclomine  04/21/2024 CTAP W no acute intra-abdominal pathology, borderline cardiomegaly, moderate right and mild left renal cortical atrophy, severe distal colonic diverticulosis without diverticulitis Previous history of C. Difficile Bm is back to once a day, still with cramping, dicyclomine   No blood in the stool, 5 lbs weight loss - if stools return, can check for Cdiff but currently back to once a day - possible IBS versus diverticulitis that was treated versus SCAD vs other - add on miralax  1/2-1 capful daily with fibe - given information about diet for diverticulitis - if symptoms return or she has blood in stool will consider flex sig/colonoscopy to evaluate for SCAD, colitis, other, would need to hold eliquis , keep appointment with Dr. Abran, patient preference  IBS mixed with abdominal discomfort 05/16/2023 CTAP W diverticulosis without diverticulitis 04/21/2024 CTAP W no acute intra-abdominal pathology, borderline cardiomegaly, moderate right and mild left renal cortical atrophy, severe distal colonic diverticulosis without diverticulitis -Add on miralax  -Consider linzess  72 mcg  Family history of colon cancer 01/17/2012 colonoscopy with Dr. Luis diverticulosis left colon extensive, large internal hemorrhoids recall 5 years due to family history of colon cancer  Atrial fibrillation/CAD/chronic diastolic heart failure Echo 08/14/2023 EF 70 to 75% unremarkable valves On Eliquis  2.5 mg twice daily  Stage III CKD BUN 16 Cr 0.96  GFR 52  Potassium 4.2  Magnesium  2.0     Patient Care Team: Garald [name], MD as PCP - General (Internal Medicine) Cindie Ole DASEN, MD as PCP - Electrophysiology (Cardiology) Wendel Lurena POUR, MD as PCP - Cardiology (Cardiology) Abran Norleen SAILOR, MD as Consulting Physician (Gastroenterology) Lavonia Lye, MD as Consulting Physician (Ophthalmology) Szabat, Toribio BROCKS, [facility] (Inactive) as Pharmacist (Pharmacist) Onesimo Emaline Brink, MD as Consulting Physician (Hematology) Plotnikov, [name], MD (Internal Medicine) Thukkani, Arun K, MD (Cardiology) Abigail Maude POUR (Optometry)  HISTORY OF PRESENT ILLNESS: 89 y.o. female with a past medical history listed below presents for evaluation of AB pain.   Last seen in the office 06/04/2023 by Dr. Abran for IBS with constipation, history of diverticulitis.  Unremarkable CT continue on fiber, dicyclomine ,   Discussed the use of AI scribe software for clinical note transcription with the patient, who gave verbal consent to proceed.  History of Present Illness   Hailey [name] is an 89 year old female with IBS and diverticulosis who presents with abdominal cramping and diarrhea.  She experiences abdominal cramping and diarrhea following the consumption of dairy products, despite taking lactase supplements. Initially, she had vomiting and loose stools after consuming an ice cream sandwich, which she attributed to lactose intolerance. A similar episode occurred six days later after consuming another dairy dessert.  Approximately a week after these episodes, she developed severe stomach cramps and frequent, very soft stools for about five days. During this time, she was unable to eat normally and adhered to a [employer]. Her symptoms significantly impacted her ability to participate in social activities, such as dining out and going to the beach with her family.  She was prescribed an antibiotic, cipro , which she took twice daily for five days. While the frequency  of bowel movements decreased to once daily, she continues to  experience cramping and occasional soft stools. Dicyclomine , which she uses for cramping, seems to increase bowel movements the following day. Zofran  has been effective in alleviating nausea.  No blood in stool is noted, but there is a weight loss of about five pounds, attributed to a restricted diet. She experiences occasional heartburn but no upper abdominal pain or vomiting. Her last colonoscopy in 2013 showed extensive diverticulosis.  Her current medications include Eliquis  2.5 mg twice daily and a probiotic.   She  reports that she has never smoked. She has never used smokeless tobacco. She reports current alcohol use. She reports that she does not use drugs.  RELEVANT GI HISTORY, IMAGING AND LABS: Results   RADIOLOGY Abdominal CT: Diverticulosis, no diverticulitis (05/2023)  DIAGNOSTIC Colonoscopy: Extensive diverticulosis (2013)      CBC    Component Value Date/Time   WBC 5.1 04/21/2024 1143   RBC 3.94 04/21/2024 1143   HGB 12.6 04/21/2024 1143   HGB 12.7 08/20/2022 1145   HGB 10.0 (L) 08/15/2021 1056   HCT 38.2 04/21/2024 1143   HCT 30.5 (L) 08/15/2021 1056   PLT 284.0 04/21/2024 1143   PLT 178 08/20/2022 1145   PLT 365 08/15/2021 1056   MCV 97.0 04/21/2024 1143   MCV 82 08/15/2021 1056   MCH 32.1 02/09/2023 0031   MCHC 33.0 04/21/2024 1143   RDW 14.5 04/21/2024 1143   RDW 15.4 08/15/2021 1056   LYMPHSABS 1.1 04/21/2024 1143   LYMPHSABS 1.3 08/15/2021 1056   MONOABS 0.6 04/21/2024 1143   EOSABS 0.3 04/21/2024 1143   EOSABS 0.4 08/15/2021 1056   BASOSABS 0.0 04/21/2024 1143   BASOSABS 0.0 08/15/2021 1056   Recent Labs    08/19/23 1547 10/11/23 1104 02/26/24 0900 04/21/24 1143  HGB 13.3 13.8 12.8 12.6    CMP     Component Value Date/Time   NA 139 04/21/2024 1143   NA 138 08/15/2021 1056   K 4.2 04/21/2024 1143   CL 104 04/21/2024 1143   CO2 27 04/21/2024 1143   GLUCOSE 93 04/21/2024 1143   GLUCOSE 88 07/26/2006 0903   BUN 16 04/21/2024 1143   BUN  21 08/15/2021 1056   CREATININE 0.96 04/21/2024 1143   CREATININE 1.15 (H) 08/20/2022 1145   CREATININE 1.25 (H) 09/16/2015 1017   CALCIUM  9.2 04/21/2024 1143   PROT 6.6 04/21/2024 1143   PROT 6.4 01/29/2022 1006   ALBUMIN 4.0 04/21/2024 1143   ALBUMIN 4.2 01/29/2022 1006   AST 17 04/21/2024 1143   AST 28 08/20/2022 1145   ALT 9 04/21/2024 1143   ALT 14 08/20/2022 1145   ALKPHOS 57 04/21/2024 1143   BILITOT 0.5 04/21/2024 1143   BILITOT 0.5 08/20/2022 1145   GFRNONAA 52 (L) 02/09/2023 0031   GFRNONAA 47 (L) 08/20/2022 1145   GFRAA 54 (L) 07/21/2020 1251   GFRAA 58 (L) 12/02/2019 1447      Latest Ref Rng & Units 04/21/2024   11:43 AM 02/26/2024    9:00 AM 10/11/2023   11:04 AM  Hepatic Function  Total Protein 6.0 - 8.3 g/dL 6.6  6.5  6.7   Albumin 3.5 - 5.2 g/dL 4.0  4.1  4.1   AST 0 - 37 U/L 17  14  19    ALT 0 - 35 U/L 9  8  9    Alk Phosphatase 39 - 117 U/L 57  52  53   Total Bilirubin 0.2 - 1.2  mg/dL 0.5  0.9  0.9       Current Medications:   Current Outpatient Medications (Endocrine & Metabolic):    empagliflozin  (JARDIANCE ) 10 MG TABS tablet, TAKE 1 TABLET BY MOUTH DAILY  BEFORE BREAKFAST   Estradiol  0.75 MG/1.25 GM (0.06%) topical gel, 1 pump to skin to arm Transdermal Once a day; Duration: 30 day(s)  Current Outpatient Medications (Cardiovascular):    losartan  (COZAAR ) 25 MG tablet, Take 1 tablet (25 mg total) by mouth daily.   nitroGLYCERIN  (NITROSTAT ) 0.4 MG SL tablet, Place 1 tablet (0.4 mg total) under the tongue every 5 (five) minutes as needed for chest pain (Call 911 at 3rd dose within 15 minutes.).  Current Outpatient Medications (Respiratory):    sodium chloride  (OCEAN) 0.65 % SOLN nasal spray, Place 1 spray into both nostrils at bedtime as needed for congestion.  Current Outpatient Medications (Analgesics):    acetaminophen  (TYLENOL ) 500 MG tablet, Take 1,000 mg by mouth every 6 (six) hours as needed (body aches / sore hip). Maximum of 6 tablets daily  (3000mg )  Current Outpatient Medications (Hematological):    ELIQUIS  2.5 MG TABS tablet, TAKE 1 TABLET BY MOUTH TWICE  DAILY  Current Outpatient Medications (Other):    BIOTIN PO, Take 5,000 mcg by mouth in the morning.   cephALEXin  (KEFLEX ) 500 MG capsule, Take 500 mg by mouth 2 (two) times daily.   clobetasol  ointment (TEMOVATE ) 0.05 %, Apply to the affected area in a thin layer twice a day for 2 weeks as needed for a flare.  Then apply to the area twice a week at bedtime as maintenance dosing.   [employer] Sunscreen SPF 30 LOTN, apply topically to sun exposed areas Daily; Duration: 30   diclofenac  Sodium (VOLTAREN ) 1 % GEL, Apply 2 g topically 4 (four) times daily as needed (back pain.).   dicyclomine  (BENTYL ) 20 MG tablet, Take 1 by mouth every 4-6 hours as needed for cramping   escitalopram  (LEXAPRO ) 10 MG tablet, Take 1 tablet (10 mg total) by mouth daily.   esomeprazole  (NEXIUM ) 40 MG capsule, TAKE ONE CAPSULE ONCE DAILY   fluorouracil (EFUDEX) 5 % cream, 1 application (HOLD FOR SEVERE INFLAMMATION) Externally TO PRECANCERS once a day; Duration: 21 days   Homeopathic Products (ARNICARE) GEL, Apply 1 application  topically daily as needed (skin bruising).   hyoscyamine  (LEVSIN ) 0.125 MG tablet, TAKE 1-2 TABLETS EVERY 4 HOURS AS NEEDED FOR UP TO 10 DAYS FOR CRAMPING.   levofloxacin (LEVAQUIN) 750 MG tablet, Take 750 mg by mouth daily.   LORazepam  (ATIVAN ) 1 MG tablet, TAKE ONE TABLET BY MOUTH TWICE DAILY AS NEEDED FOR ANXIETY / SLEEP.   mupirocin  ointment (BACTROBAN ) 2 %, On leg wound w/dressing change qd or bid   NON FORMULARY, Vitamin c, vitamin d    ondansetron  (ZOFRAN -ODT) 8 MG disintegrating tablet, dissolve 1 tablet (8 mg total) in mouth every 8 (eight) hours as needed for nausea or vomiting.   Polyethyl Glycol-Propyl Glycol (LUBRICANT EYE DROPS) 0.4-0.3 % SOLN, Place 1-2 drops into both eyes at bedtime.   polyethylene glycol (MIRALAX  / GLYCOLAX ) 17 g packet, Take 17 g by mouth as needed  for mild constipation or moderate constipation.   valACYclovir  (VALTREX ) 500 MG tablet, Take 1 tablet (500 mg total) by mouth daily. Take one tablet by mouth daily for 3 days for an outbreak  Medical History:  Past Medical History:  Diagnosis Date   Acute blood loss anemia 12/25/2017   Allergic rhinitis    Anxiety  Arthritis    my whole spine (07/01/2017)   Arthritis    Atrial fibrillation (HCC)    Bowel obstruction (HCC)    in [place]    Bradycardia with 41-50 beats per minute 12/27/2017   Cancer (HCC)    Cellulitis of leg, right with large prepatella hematoma and open wounds 12/26/2017   CKD (chronic kidney disease) stage 2, GFR 60-89 ml/min    Colon polyps    Coronary artery disease    10/18 PCI/DES to p/m LCx with cutting balloon to mLcx   Diverticulosis of colon    GERD (gastroesophageal reflux disease)    Hip bursitis 2010   Dr Jacklin, Post op seroma   History of colon polyps    HSV (herpes simplex virus) anogenital infection 07/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   HTN (hypertension)    IBS (irritable bowel syndrome)    constipation predominant - Dr Luis   Lichen sclerosus    Osteopenia 11/2016   T score -2.0 FRAX 15%/4.3%   PAC (premature atrial contraction)    Symptomatiic   Renal insufficiency    Right bundle branch block (RBBB) on electrocardiogram (ECG) 12/27/2017   Scoliosis    SVT (supraventricular tachycardia) (HCC)    brief history   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   Meloxicam  Other (See Comments)    Jittery and headache   Digoxin  And Related Other (See Comments)    headaches   Diprolene  [Betamethasone  Dipropionate Aug] Other (See Comments)    Patient states that the bruising was made worse when using this   Ferrous Sulfate  Other (See Comments)    Bad constipation    Keflex  [Cephalexin ] Nausea And Vomiting    Nausea, fatigue, and headache.   Lipitor  [Atorvastatin ]     Abd pain   Mobic  [Meloxicam ] Other (See Comments)    Unsure of reaction type   Sulfamethoxazole -Trimethoprim  Nausea Only     Surgical History:  She  has a past surgical history that includes LEFT HEART CATH AND CORONARY ANGIOGRAPHY (N/A, 06/26/2017); Anterior and posterior vaginal repair (01/2002); Vaginal hysterectomy (01/2002); Pubovaginal sling (01/2002); Knee bursectomy (Right, 04/2009); Hip surgery (Left, 04/2009); Appendectomy (1948); Hemorrhoid banding; Reduction mammaplasty; Hammer toe surgery; Cardiac catheterization (06/26/2017); Coronary angioplasty with stent (07/01/2017); CORONARY ATHERECTOMY (N/A, 07/01/2017); CORONARY STENT INTERVENTION (N/A, 07/01/2017); TEMPORARY PACEMAKER (N/A, 07/01/2017); I & D extremity (Right, 01/10/2018); LEFT ATRIAL APPENDAGE OCCLUSION (N/A, 08/24/2021); and TEE without cardioversion (N/A, 08/24/2021). Family History:  Her family history includes Colon cancer in her mother; Diabetes in her father; Pancreatic cancer in her brother; Prostate cancer in her brother and father; Stomach cancer in her son.  REVIEW OF SYSTEMS  : All other systems reviewed and negative except where noted in the History of Present Illness.  PHYSICAL EXAM: BP 130/80 (BP Location: Left Arm, Patient Position: Sitting, Cuff Size: Normal)   Pulse 61   Ht 5' 4.17 (1.63 m) Comment: height without shoes  Wt 140 lb (63.5 kg)   BMI 23.90 kg/m  Physical Exam   GENERAL APPEARANCE: Well nourished, in no apparent distress. HEENT: No cervical lymphadenopathy, unremarkable thyroid , sclerae anicteric, conjunctiva pink. RESPIRATORY: Respiratory effort normal, breath sounds equal bilaterally without rales, rhonchi, or wheezing. CARDIO: Irregular heartbeats, prominent S2. Peripheral pulses intact. ABDOMEN: Soft, non-distended, active bowel  sounds  in all four quadrants, no tenderness to palpation, no rebound, no mass appreciated. RECTAL: Declines. MUSCULOSKELETAL: Full range of motion, normal gait, without edema. SKIN: Dry, intact without rashes or lesions. No jaundice. NEURO: Alert, oriented, no focal deficits. PSYCH: Cooperative, normal mood and affect.      Alan JONELLE Coombs, PA-C 9:41 AM

## 2024-04-27 ENCOUNTER — Ambulatory Visit: Admitting: Physician Assistant

## 2024-04-27 ENCOUNTER — Encounter: Payer: Self-pay | Admitting: Physician Assistant

## 2024-04-27 VITALS — BP 130/80 | HR 61 | Ht 64.17 in | Wt 140.0 lb

## 2024-04-27 DIAGNOSIS — K581 Irritable bowel syndrome with constipation: Secondary | ICD-10-CM

## 2024-04-27 DIAGNOSIS — I4891 Unspecified atrial fibrillation: Secondary | ICD-10-CM

## 2024-04-27 DIAGNOSIS — K582 Mixed irritable bowel syndrome: Secondary | ICD-10-CM

## 2024-04-27 DIAGNOSIS — I5032 Chronic diastolic (congestive) heart failure: Secondary | ICD-10-CM | POA: Diagnosis not present

## 2024-04-27 DIAGNOSIS — K5730 Diverticulosis of large intestine without perforation or abscess without bleeding: Secondary | ICD-10-CM

## 2024-04-27 DIAGNOSIS — Z800 Family history of malignant neoplasm of digestive organs: Secondary | ICD-10-CM | POA: Diagnosis not present

## 2024-04-27 DIAGNOSIS — R109 Unspecified abdominal pain: Secondary | ICD-10-CM

## 2024-04-27 DIAGNOSIS — I2510 Atherosclerotic heart disease of native coronary artery without angina pectoris: Secondary | ICD-10-CM | POA: Diagnosis not present

## 2024-04-27 DIAGNOSIS — Z7901 Long term (current) use of anticoagulants: Secondary | ICD-10-CM | POA: Diagnosis not present

## 2024-04-27 NOTE — Patient Instructions (Signed)
 Miralax  is an osmotic laxative.  It only brings more water into the stool.  This is safe to take daily.  Can take up to 17 gram of miralax  twice a day.  Mix with juice or coffee.  Start 1/2 capful for 3-4 days and reassess your response in 3-4 days.  You can increase and decrease the dose based on your response.  Remember, it can take up to 3-4 days to take effect OR for the effects to wear off.   I often pair this with benefiber in the morning to help assure the stool is not too loose.    During an Acute Flare-Up (Clear Liquid to Low-Fiber Diet) The goal is to reduce irritation and let your colon rest.  Day 1-3: Clear Liquid Diet Water  Broth (chicken, beef, or vegetable)  Clear juices (apple, white grape - avoid citrus)  Ice pops without pulp or seeds  Gelatin (no fruit or seeds)  Tea or coffee (no cream or dairy)  After Symptoms Improve: Low-Fiber Diet Gradually transition to low-fiber foods for easier digestion.  Sample Foods White rice, pasta, or plain white bread  Cooked or canned vegetables without skins or seeds (e.g., carrots, green beans, potatoes)  Eggs, fish, or poultry  Low-fiber cereals (like cornflakes)  Dairy (if tolerated)  Ripe bananas, melon, or canned fruit without seeds  Long-Term Maintenance: High-Fiber Diet (Once Fully Recovered) This helps prevent future flare-ups by keeping the bowel movements soft and regular.  High-Fiber Foods Fruits: Apples (peeled), pears, berries, prunes  Vegetables: Broccoli, spinach, zucchini, peas  Whole grains: Oatmeal, brown rice, quinoa, whole wheat bread  Legumes: Lentils, chickpeas, black beans (start slowly to avoid gas)  Nuts & seeds: Only if tolerated (research no longer restricts them, but if you feel they cause a flare do not eat them)  Diverticulosis Diverticulosis is a condition that develops when small pouches (diverticula) form in the wall of the large intestine (colon). The colon is where water  is absorbed and stool (feces) is formed. The pouches form when the inside layer of the colon pushes through weak spots in the outer layers of the colon. You may have a few pouches or many of them. The pouches usually do not cause problems unless they become inflamed or infected. When this happens, the condition is called diverticulitis- this is left lower quadrant pain, diarrhea, fever, chills, nausea or vomiting.  If this occurs please call the office or go to the hospital. Sometimes these patches without inflammation can also have painless bleeding associated with them, if this happens please call the office or go to the hospital. Preventing constipation and increasing fiber can help reduce diverticula and prevent complications. Even if you feel you have a high-fiber diet, suggest getting on Benefiber or Cirtracel 2 times daily.  First do a trial off milk/lactose products if you use them.  Add fiber like benefiber or citracel once a day Increase activity Can do trial of IBGard which is over the counter for AB pain- Take 1-2 capsules once a day for maintence or twice a day during a flare For IBS and peppermint oil.  Peppermint oil has been proven to be better than placebo for cramping for IBS Stop if it worsens heart burn or causes flushing of your face.  Ideally enteric coated peppermint oil capsules over the counter IBGard, can take 2 a day is best but if you got the oil, you can use 0.51ml or 180 mg of pepperment oil up to 3 x  a day.     FODMAP stands for fermentable oligo-, di-, mono-saccharides and polyols (1). These are the scientific terms used to classify groups of carbs that are difficult for our body to digest and that are notorious for triggering digestive symptoms like bloating, gas, loose stools and stomach pain.   You can try low FODMAP diet  - start with eliminating just one column at a time that you feel may be a trigger for you. - the table at the very bottom contains foods that  are low in FODMAPs   Sometimes trying to eliminate the FODMAP's from your diet is difficult or tricky, if you are stuggling with trying to do the elimination diet you can try an enzyme.  There is a food enzymes that you sprinkle in or on your food that helps break down the FODMAP. You can read more about the enzyme by going to this site: https://fodzyme.com/

## 2024-04-29 ENCOUNTER — Ambulatory Visit: Admitting: Physical Medicine and Rehabilitation

## 2024-04-29 ENCOUNTER — Other Ambulatory Visit: Payer: Self-pay

## 2024-04-29 ENCOUNTER — Encounter: Payer: Self-pay | Admitting: Physical Medicine and Rehabilitation

## 2024-04-29 VITALS — BP 128/82

## 2024-04-29 DIAGNOSIS — M5416 Radiculopathy, lumbar region: Secondary | ICD-10-CM | POA: Diagnosis not present

## 2024-04-29 NOTE — Progress Notes (Signed)
 Pain Scale   Average Pain 7    Pain low right side of back to the knee.  Patient taking Eliquis  and having a TF    +Driver, -BT, -Dye Allergies.

## 2024-05-03 NOTE — Progress Notes (Signed)
 CHONTEL WARNING - 89 y.o. female MRN [id number]  Date of birth: August 22, 1935  Office Visit Note: Visit Date: 04/29/2024 PCP: Garald [name], MD Referred by: Garald [name], MD  Subjective: No chief complaint on file.  HPI:  Hailey [name] is a 89 y.o. female who comes in today at the request of Duwaine Pouch, FNP for planned Right L5-S1 Lumbar Transforaminal epidural steroid injection with fluoroscopic guidance.  The patient has failed conservative care including home exercise, medications, time and activity modification.  This injection will be diagnostic and hopefully therapeutic.  Please see requesting physician notes for further details and justification.   ROS Otherwise per HPI.  Assessment & Plan: Visit Diagnoses:    ICD-10-CM   1. Lumbar radiculopathy  M54.16 XR C-ARM NO REPORT    Epidural Steroid injection      Plan: No additional findings.   Meds & Orders: No orders of the defined types were placed in this encounter.   Orders Placed This Encounter  Procedures   XR C-ARM NO REPORT   Epidural Steroid injection    Follow-up: Return for visit to requesting provider as needed.   Procedures: No procedures performed  Lumbosacral Transforaminal Epidural Steroid Injection - Sub-Pedicular Approach with Fluoroscopic Guidance  Patient: Hailey [name]      Date of Birth: 1934/10/30 MRN: [id number] PCP: Garald [name], MD      Visit Date: 04/29/2024   Universal Protocol:    Date/Time: 04/29/2024  Consent Given By: the patient  Position: PRONE  Additional Comments: Vital signs were monitored before and after the procedure. Patient was prepped and draped in the usual sterile fashion. The correct patient, procedure, and site was verified.   Injection Procedure Details:   Procedure diagnoses: Lumbar radiculopathy [M54.16]    Meds Administered: No orders of the defined types were placed in this encounter.   Laterality: Right  Location/Site:  L5  Needle:5.0 in., 22 ga.  Short bevel or Quincke spinal needle  Needle Placement: Transforaminal  Findings:    -Comments: Excellent flow of contrast along the nerve, nerve root and into the epidural space.  Procedure Details: After squaring off the end-plates to get a true AP view, the C-arm was positioned so that an oblique view of the foramen as noted above was visualized. The target area is just inferior to the nose of the scotty dog or sub pedicular. The soft tissues overlying this structure were infiltrated with 2-3 ml. of 1% Lidocaine  without Epinephrine .  The spinal needle was inserted toward the target using a trajectory view along the fluoroscope beam.  Under AP and lateral visualization, the needle was advanced so it did not puncture dura and was located close the 6 O'Clock position of the pedical in AP tracterory. Biplanar projections were used to confirm position. Aspiration was confirmed to be negative for CSF and/or blood. A 1-2 ml. volume of Isovue -250 was injected and flow of contrast was noted at each level. Radiographs were obtained for documentation purposes.   After attaining the desired flow of contrast documented above, a 0.5 to 1.0 ml test dose of 0.25% Marcaine  was injected into each respective transforaminal space.  The patient was observed for 90 seconds post injection.  After no sensory deficits were reported, and normal lower extremity motor function was noted,   the above injectate was administered so that equal amounts of the injectate were placed at each foramen (level) into the transforaminal epidural space.   Additional Comments:  The patient  tolerated the procedure well Dressing: 2 x 2 sterile gauze and Band-Aid    Post-procedure details: Patient was observed during the procedure. Post-procedure instructions were reviewed.  Patient left the clinic in stable condition.    Clinical History: EXAM: MRI LUMBAR SPINE WITHOUT CONTRAST    TECHNIQUE: Multiplanar, multisequence MR imaging of the lumbar spine was performed. No intravenous contrast was administered.   COMPARISON:  MR lumbar 03/20/2017; X-ray lumbar 09/14/2021.   FINDINGS: Segmentation:  Standard.   Alignment: Scoliotic thoracolumbar curvature. Mild retrolisthesis at L1-2, L2-3, and L3-4.   Vertebrae: No fracture, evidence of discitis, or suspicious bone lesion. Scattered intraosseous hemangiomas.   Conus medullaris and cauda equina: Conus extends to the L1 level. Conus and cauda equina appear normal.   Paraspinal and other soft tissues: No acute findings.   Disc levels:   T12-L1: Mild disc bulge. No foraminal or canal stenosis. Unchanged.   L1-L2: Disc height loss with mild disc bulge and endplate ridging. Mild left foraminal stenosis. No canal stenosis. Unchanged.   L2-L3: Retrolisthesis. Diffuse disc bulge with small caudally extending right paracentral disc extrusion. Mild bilateral facet arthropathy. Mild canal stenosis with moderate bilateral foraminal stenosis. Findings have progressed from prior.   L3-L4: Diffuse disc bulge with mild bilateral facet arthropathy and ligamentum flavum buckling. Findings result in mild-moderate canal stenosis with moderate right foraminal stenosis. Findings progressed from prior.   L4-L5: Disc bulge with endplate osteophytic ridging. Advanced bilateral facet arthropathy. Severe right and mild left foraminal stenosis. No significant interval progression.   L5-S1: Disc height loss and endplate ridging. Mild facet hypertrophy. No canal stenosis. Mild-to-moderate left and mild right foraminal stenosis. Unchanged.   IMPRESSION: 1. Multilevel lumbar spondylosis, slightly progressed from prior MRI. 2. Mild-moderate canal stenosis with moderate right foraminal stenosis at L3-4. 3. Mild canal stenosis and moderate bilateral foraminal stenosis at L2-3. 4. Severe right and mild left foraminal stenosis at  L4-5.     Electronically Signed   By: Mabel Converse D.O.   On: 11/29/2021 13:23     Objective:  VS:  HT:    WT:   BMI:     BP:128/82  HR: bpm  TEMP: ( )  RESP:  Physical Exam Vitals and nursing note reviewed.  Constitutional:      General: She is not in acute distress.    Appearance: Normal appearance. She is well-developed. She is not ill-appearing.  HENT:     Head: Normocephalic and atraumatic.     Right Ear: External ear normal.     Left Ear: External ear normal.  Eyes:     Extraocular Movements: Extraocular movements intact.     Conjunctiva/sclera: Conjunctivae normal.     Pupils: Pupils are equal, round, and reactive to light.  Cardiovascular:     Rate and Rhythm: Normal rate.     Pulses: Normal pulses.  Pulmonary:     Effort: Pulmonary effort is normal. No respiratory distress.  Abdominal:     General: There is no distension.     Palpations: Abdomen is soft.  Musculoskeletal:        General: Tenderness present.     Cervical back: Neck supple.     Right lower leg: No edema.     Left lower leg: No edema.     Comments: Patient has good distal strength with no pain over the greater trochanters.  No clonus or focal weakness.  Skin:    General: Skin is warm and dry.     Findings: No  erythema, lesion or rash.  Neurological:     General: No focal deficit present.     Mental Status: She is alert and oriented to person, place, and time.     Sensory: No sensory deficit.     Motor: No weakness or abnormal muscle tone.     Coordination: Coordination normal.     Gait: Gait normal.  Psychiatric:        Mood and Affect: Mood normal.        Behavior: Behavior normal.      Imaging: No results found.

## 2024-05-03 NOTE — Procedures (Signed)
 Lumbosacral Transforaminal Epidural Steroid Injection - Sub-Pedicular Approach with Fluoroscopic Guidance  Patient: Hailey [name]      Date of Birth: 1935/06/11 MRN: [id number] PCP: Garald [name], MD      Visit Date: 04/29/2024   Universal Protocol:    Date/Time: 04/29/2024  Consent Given By: the patient  Position: PRONE  Additional Comments: Vital signs were monitored before and after the procedure. Patient was prepped and draped in the usual sterile fashion. The correct patient, procedure, and site was verified.   Injection Procedure Details:   Procedure diagnoses: Lumbar radiculopathy [M54.16]    Meds Administered: No orders of the defined types were placed in this encounter.   Laterality: Right  Location/Site: L5  Needle:5.0 in., 22 ga.  Short bevel or Quincke spinal needle  Needle Placement: Transforaminal  Findings:    -Comments: Excellent flow of contrast along the nerve, nerve root and into the epidural space.  Procedure Details: After squaring off the end-plates to get a true AP view, the C-arm was positioned so that an oblique view of the foramen as noted above was visualized. The target area is just inferior to the nose of the scotty dog or sub pedicular. The soft tissues overlying this structure were infiltrated with 2-3 ml. of 1% Lidocaine  without Epinephrine .  The spinal needle was inserted toward the target using a trajectory view along the fluoroscope beam.  Under AP and lateral visualization, the needle was advanced so it did not puncture dura and was located close the 6 O'Clock position of the pedical in AP tracterory. Biplanar projections were used to confirm position. Aspiration was confirmed to be negative for CSF and/or blood. A 1-2 ml. volume of Isovue -250 was injected and flow of contrast was noted at each level. Radiographs were obtained for documentation purposes.   After attaining the desired flow of contrast documented above, a 0.5  to 1.0 ml test dose of 0.25% Marcaine  was injected into each respective transforaminal space.  The patient was observed for 90 seconds post injection.  After no sensory deficits were reported, and normal lower extremity motor function was noted,   the above injectate was administered so that equal amounts of the injectate were placed at each foramen (level) into the transforaminal epidural space.   Additional Comments:  The patient tolerated the procedure well Dressing: 2 x 2 sterile gauze and Band-Aid    Post-procedure details: Patient was observed during the procedure. Post-procedure instructions were reviewed.  Patient left the clinic in stable condition.

## 2024-05-12 ENCOUNTER — Telehealth: Payer: Self-pay

## 2024-05-12 DIAGNOSIS — M5416 Radiculopathy, lumbar region: Secondary | ICD-10-CM

## 2024-05-12 DIAGNOSIS — M47816 Spondylosis without myelopathy or radiculopathy, lumbar region: Secondary | ICD-10-CM

## 2024-05-12 DIAGNOSIS — M48061 Spinal stenosis, lumbar region without neurogenic claudication: Secondary | ICD-10-CM

## 2024-05-12 NOTE — Telephone Encounter (Signed)
 Patient left voicemail asking for a call back per [facility] 10 days post her [facility] [phone]

## 2024-05-12 NOTE — Telephone Encounter (Signed)
 10 days after injection, patient advising she has pain down right leg  and pain in back is better.

## 2024-05-13 ENCOUNTER — Telehealth: Payer: Self-pay | Admitting: Physician Assistant

## 2024-05-13 NOTE — Telephone Encounter (Signed)
 Called and spoke with patient. Patient states that 9/23 is a jewish holiday so she will have to cancel appt with [place], [place]. Offered patient an appt with Dr. Abran but she stated that she would like to see Dr. Shila. Patient has been scheduled for an appt with Dr. Nandigam on 06/03/24 at 10:40 am. Patient had no other concerns.

## 2024-05-13 NOTE — Telephone Encounter (Signed)
 Inbound call from patient stating she is friends with Dr. Cloretta and she stated she was advised to call to see if she is able to get in sooner with Dr. Shila due to being unable to see Dr. Abran in the near future. Patient is requesting a call back. Please advise, thank you

## 2024-05-14 ENCOUNTER — Other Ambulatory Visit: Payer: Self-pay | Admitting: Internal Medicine

## 2024-05-14 ENCOUNTER — Encounter: Admitting: Physical Medicine and Rehabilitation

## 2024-05-15 ENCOUNTER — Telehealth: Payer: Self-pay

## 2024-05-15 ENCOUNTER — Other Ambulatory Visit: Payer: Self-pay | Admitting: Obstetrics and Gynecology

## 2024-05-15 DIAGNOSIS — H353132 Nonexudative age-related macular degeneration, bilateral, intermediate dry stage: Secondary | ICD-10-CM | POA: Diagnosis not present

## 2024-05-15 DIAGNOSIS — Z1231 Encounter for screening mammogram for malignant neoplasm of breast: Secondary | ICD-10-CM

## 2024-05-15 NOTE — Telephone Encounter (Signed)
 Patient called triage phone to followup on MRI, I told her I would send a message to check on this. I do not see an MRI order in her chart

## 2024-05-15 NOTE — Addendum Note (Signed)
 Addended by: ELDONNA GARDINER POUR on: 05/15/2024 09:57 AM   Modules accepted: Orders

## 2024-05-18 ENCOUNTER — Ambulatory Visit: Admitting: Internal Medicine

## 2024-05-19 ENCOUNTER — Encounter: Payer: Self-pay | Admitting: Internal Medicine

## 2024-05-19 ENCOUNTER — Ambulatory Visit: Attending: Internal Medicine | Admitting: Internal Medicine

## 2024-05-19 VITALS — BP 124/88 | HR 78 | Ht 64.0 in | Wt 140.2 lb

## 2024-05-19 DIAGNOSIS — E785 Hyperlipidemia, unspecified: Secondary | ICD-10-CM | POA: Diagnosis not present

## 2024-05-19 DIAGNOSIS — I7121 Aneurysm of the ascending aorta, without rupture: Secondary | ICD-10-CM | POA: Diagnosis not present

## 2024-05-19 DIAGNOSIS — I4821 Permanent atrial fibrillation: Secondary | ICD-10-CM | POA: Diagnosis not present

## 2024-05-19 DIAGNOSIS — N1831 Chronic kidney disease, stage 3a: Secondary | ICD-10-CM | POA: Diagnosis not present

## 2024-05-19 DIAGNOSIS — D6869 Other thrombophilia: Secondary | ICD-10-CM | POA: Diagnosis not present

## 2024-05-19 DIAGNOSIS — I700 Atherosclerosis of aorta: Secondary | ICD-10-CM | POA: Diagnosis not present

## 2024-05-19 DIAGNOSIS — I5032 Chronic diastolic (congestive) heart failure: Secondary | ICD-10-CM

## 2024-05-19 DIAGNOSIS — I25119 Atherosclerotic heart disease of native coronary artery with unspecified angina pectoris: Secondary | ICD-10-CM

## 2024-05-19 DIAGNOSIS — I10 Essential (primary) hypertension: Secondary | ICD-10-CM

## 2024-05-19 NOTE — Progress Notes (Signed)
 Cardiology Office Note:   Date:  05/19/2024  ID:  Hailey [name], DOB [birth year], MRN [id number] PCP:  Garald [name], MD  [facility] Providers Cardiologist:  Wendel Haws, MD Referring MD: Garald [name], MD   Chief Complaint/Reason for Referral: Cardiology follow-up ASSESSMENT:    1. Coronary artery disease involving native coronary artery of native heart with angina pectoris (HCC)   2. Hyperlipidemia LDL goal <70   3. Aortic atherosclerosis (HCC)   4. Permanent atrial fibrillation (HCC)   5. Secondary hypercoagulable state (HCC)   6. Stage 3a chronic kidney disease (HCC)   7. Chronic diastolic HF (heart failure) (HCC)   8. Primary hypertension   9. Aneurysm of ascending aorta without rupture (HCC)       PLAN:   In order of problems listed above: Coronary artery disease: Continue Eliquis  2.5 mg twice daily instead of aspirin ; patient defers statins.   Hyperlipidemia: Patient defers statins.  LDL has been fairly well-controlled without statins.  Last LDL was 60 in June 2025.   Aortic atherosclerosis: Continue Eliquis  2.5 mg twice daily and patient defers statins.   Permanent atrial fibrillation: Continue Eliquis  2.5 mg twice daily  Secondary hypercoagulable state: Continue Eliquis  2.5 mg twice daily  Stage III chronic kidney disease: Continue losartan  25 mg daily and Jardiance  10 mg daily for renal protection.   Chronic diastolic heart failure: Continue losartan  25 mg daily and Jardiance  10 mg daily Hypertension: BP is well-controlled; continue losartan  25 mg Ascending aortic aneurysm: At last visit shared decision making decision to defer serial CT imaging    Dispo:  Return in about 6 months (around 11/16/2024).      Medication Adjustments/Labs and Tests Ordered: Current medicines are reviewed at length with the patient today.  Concerns regarding medicines are outlined above.  The following changes have been made:  no change   Labs/tests  ordered: Orders Placed This Encounter  Procedures   EKG 12-Lead    Medication Changes: No orders of the defined types were placed in this encounter.   Current medicines are reviewed at length with the patient today.  The patient does not have concerns regarding medicines.  History of Present Illness:    FOCUSED PROBLEM LIST:   Coronary artery disease S/p orbital atherectomy + 4 x 26 mm DES to pRCA on 07/02/17 LHC 06/26/17: 3v heavy Ca2++, pRCA 85-90, mRCA 50-60, dLM 25, mLAD 50, EF 60 Myoview  09/26/2018: EF 71, no ischemia or infarction, low risk  (HFpEF) heart failure with preserved ejection fraction  TTE 02/17/20: EF 65-70, no RWMA, mild LVH, Gr 2 DD, mod reduced RVSF, mod pulm hypertension, RVSP 58.7, mod BAE, mild MR, mod TR, AV sclerosis, RAP 8 TEE 08/24/21: EF 60-65, no RWMA, NL RVSF, NL PASP, severe LAE, small eff, mild MR, asc Aorta 40 mm Permanent atrial fibrillation CV 2 score of 6 Attempted LAAOD (Watchman) in 08/2021 unsuccessful 2/2 unfavorable anatomy Mitral regurgitation Mild to moderate TTE 2024 Tricuspid regurgitation  Mild TTE 2024 Hx of falls Now on low dose Eliquis  Chronic kidney disease stage IIIA Hypertension  Hx of orthostatic hypotension Right Bundle Branch Block  Supraventricular Tachycardia   Ascending aortic aneurysm  TTE 08/2021: 40 mm CTA 2021 thoracic aortic diameter of 4.5 cm CT 02/07/23: 4.4 cm  Patient defers surgical repair if needed Shared decision making >>no further CT scans 12/11/23 Lung nodule CT 01/2023: 4 mm RUL Hyperlipidemia Patient defers statins Aortic atherosclerosis  CT abdomen pelvis September 2024  October 2024: The  patient is seen for routine follow-up.  She was seen in June for hospital follow-up.  She was admitted with mechanical fall and suffered left humerus fracture that was treated conservatively.  She was noted to have some bradycardia.  Her metoprolol  was discontinued and 30-day monitor was arranged.  This monitor  demonstrated 100% burden of atrial fibrillation with nocturnal bradycardia.  Today her major issue is still recuperating from her fall.  She is participating in physical therapy.  She has had no issues with her Eliquis  other than nuisance lower extremity bruising.  She has had no severe bleeding.  Her breathing is fairly stable.  She will get a little bit more short of breath when she exerts herself more than moderately.  She denies any exertional angina.  She has had no recurrent falls, lightheadedness, or signs or symptoms of stroke.  She does feel palpitations occasionally typically when she lays down to go to sleep.  They are not associate with shortness of breath, chest pain, or any other issues other than being noticeable.  She is otherwise well and without significant complaints.  Plan: Restart Toprol  on a as needed basis for palpitations.  April 2025:  Patient consents to use of AI scribe. The patient returns for routine follow-up.   She is doing well from a cardiovascular standpoint.  She is currently on losartan  for this condition. She previously tried metoprolol  but experienced hypotension. She is not on any cholesterol medication, and her cholesterol levels are reportedly good. No chest pain during physical activity.  She is on a blood thinner and experiences significant bruising on her legs, which she finds distressing. She denies taking aspirin . She previously attempted a Watchman device, which was unsuccessful.  She experiences indigestion when lying down to read at night, typically a few hours after eating. She finds relief by chewing a Pepcid  Complete.  She reports ongoing issues with her lower back and upper thigh area. She received an injection for her lower back pain, which did not alleviate her symptoms. She is scheduled to see Dr. Eldonna for further treatment, specifically an injection targeting her upper thigh and hip area.  Plan: Continue medical therapy  September 2025:  Patient  consents to use of AI scribe. The patient returns for routine follow-up.  She is experiencing gastrointestinal issues and severe sciatic pain. The sciatic pain has significantly impacted her mobility and ability to exercise, which she feels affects her heart health. A recent CT scan showed no abnormalities, and she is scheduled for an MRI to further investigate the cause.  She experiences back pain originating in her lower back, affecting her walking. An epidural did not provide relief, and she is awaiting MRI results for further investigation. Physical therapy and exercises with a trainer have not alleviated her pain     Current Medications: Current Meds  Medication Sig   acetaminophen  (TYLENOL ) 500 MG tablet Take 1,000 mg by mouth every 6 (six) hours as needed (body aches / sore hip). Maximum of 6 tablets daily (3000mg )   BIOTIN PO Take 5,000 mcg by mouth in the morning.   cephALEXin  (KEFLEX ) 500 MG capsule Take 500 mg by mouth 2 (two) times daily.   clobetasol  ointment (TEMOVATE ) 0.05 % Apply to the affected area in a thin layer twice a day for 2 weeks as needed for a flare.  Then apply to the area twice a week at bedtime as maintenance dosing.   [employer] Sunscreen SPF 30 LOTN apply topically to sun exposed areas  Daily; Duration: 30   diclofenac  Sodium (VOLTAREN ) 1 % GEL Apply 2 g topically 4 (four) times daily as needed (back pain.).   dicyclomine  (BENTYL ) 20 MG tablet Take 1 by mouth every 4-6 hours as needed for cramping   ELIQUIS  2.5 MG TABS tablet TAKE 1 TABLET BY MOUTH TWICE  DAILY   empagliflozin  (JARDIANCE ) 10 MG TABS tablet TAKE 1 TABLET BY MOUTH DAILY  BEFORE BREAKFAST   escitalopram  (LEXAPRO ) 10 MG tablet Take 1 tablet (10 mg total) by mouth daily.   esomeprazole  (NEXIUM ) 40 MG capsule TAKE ONE CAPSULE ONCE DAILY   Estradiol  0.75 MG/1.25 GM (0.06%) topical gel 1 pump to skin to arm Transdermal Once a day; Duration: 30 day(s)   fluorouracil (EFUDEX) 5 % cream 1 application (HOLD FOR  SEVERE INFLAMMATION) Externally TO PRECANCERS once a day; Duration: 21 days   Homeopathic Products (ARNICARE) GEL Apply 1 application  topically daily as needed (skin bruising).   hyoscyamine  (LEVSIN ) 0.125 MG tablet TAKE 1-2 TABLETS EVERY 4 HOURS AS NEEDED FOR UP TO 10 DAYS FOR CRAMPING.   levofloxacin (LEVAQUIN) 750 MG tablet Take 750 mg by mouth daily.   LORazepam  (ATIVAN ) 1 MG tablet TAKE ONE TABLET BY MOUTH TWICE DAILY AS NEEDED FOR ANXIETY / SLEEP.   losartan  (COZAAR ) 25 MG tablet Take 1 tablet (25 mg total) by mouth daily.   mupirocin  ointment (BACTROBAN ) 2 % On leg wound w/dressing change qd or bid   nitroGLYCERIN  (NITROSTAT ) 0.4 MG SL tablet Place 1 tablet (0.4 mg total) under the tongue every 5 (five) minutes as needed for chest pain (Call 911 at 3rd dose within 15 minutes.).   NON FORMULARY Vitamin c, vitamin d    ondansetron  (ZOFRAN -ODT) 8 MG disintegrating tablet dissolve 1 tablet (8 mg total) in mouth every 8 (eight) hours as needed for nausea or vomiting.   Polyethyl Glycol-Propyl Glycol (LUBRICANT EYE DROPS) 0.4-0.3 % SOLN Place 1-2 drops into both eyes at bedtime.   polyethylene glycol (MIRALAX  / GLYCOLAX ) 17 g packet Take 17 g by mouth as needed for mild constipation or moderate constipation.   sodium chloride  (OCEAN) 0.65 % SOLN nasal spray Place 1 spray into both nostrils at bedtime as needed for congestion.   valACYclovir  (VALTREX ) 500 MG tablet Take 1 tablet (500 mg total) by mouth daily. Take one tablet by mouth daily for 3 days for an outbreak      Review of Systems:   Please see the history of present illness.    All other systems reviewed and are negative.      EKGs/Labs/Other Test Reviewed:   EKG: October 2024 atrial fibrillation with PVCs and parent complexes  EKG Interpretation Date/Time:  Tuesday May 19 2024 14:48:51 EDT Ventricular Rate:  72 PR Interval:    QRS Duration:  128 QT Interval:  434 QTC Calculation: 475 R Axis:   6  Text  Interpretation: Atrial fibrillation with premature ventricular or aberrantly conducted complexes Right bundle branch block Septal infarct (cited on or before 14-Jun-2023) When compared with ECG of 14-Jun-2023 10:08, Questionable change in initial forces of Septal leads Confirmed by Wendel Haws ([id number]) on 05/19/2024 2:53:09 PM          Risk Assessment/Calculations:    CHA2DS2-VASc Score = 6   This indicates a 9.7% annual risk of stroke. The patient's score is based upon: CHF History: 1 HTN History: 1 Diabetes History: 0 Stroke History: 0 Vascular Disease History: 1 Age Score: 2 Gender Score: 1  Physical Exam:   VS:  BP 124/88   Pulse 78   Ht 5' 4 (1.626 m)   Wt 140 lb 3.2 oz (63.6 kg)   SpO2 94%   BMI 24.07 kg/m        Wt Readings from Last 3 Encounters:  05/19/24 140 lb 3.2 oz (63.6 kg)  04/27/24 140 lb (63.5 kg)  02/19/24 146 lb (66.2 kg)      GENERAL:  No apparent distress, AOx3 HEENT:  No carotid bruits, +2 carotid impulses, no scleral icterus CAR: Irregular RR no murmurs, gallops, rubs, or thrills RES:  Clear to auscultation bilaterally ABD:  Soft, nontender, nondistended, positive bowel sounds x 4 VASC:  +2 radial pulses, +2 carotid pulses NEURO:  CN 2-12 grossly intact; motor and sensory grossly intact PSYCH:  No active depression or anxiety EXT:  No edema, ecchymosis, or cyanosis  Signed, Yanelly Cantrelle K Virat Prather, MD  05/19/2024 3:44 PM    [facility] [address] [place], [place], [place]  [address] Phone: [phone]; Fax: [phone]   Note:  This document was prepared using Dragon voice recognition software and may include unintentional dictation errors.

## 2024-05-19 NOTE — Patient Instructions (Signed)
 Medication Instructions:  No medication changes were made at this visit. Continue current regimen.   *If you need a refill on your cardiac medications before your next appointment, please call your pharmacy*  Lab Work: None ordered today. If you have labs (blood work) drawn today and your tests are completely normal, you will receive your results only by: MyChart Message (if you have MyChart) OR A paper copy in the mail If you have any lab test that is abnormal or we need to change your treatment, we will call you to review the results.  Testing/Procedures: None ordered today.  Follow-Up: At [facility], you and your health needs are our priority.  As part of our continuing mission to provide you with exceptional heart care, our providers are all part of one team.  This team includes your primary Cardiologist (physician) and Advanced Practice Providers or APPs (Physician Assistants and Nurse Practitioners) who all work together to provide you with the care you need, when you need it.  Your next appointment:   6 month(s)  Provider:   Arun Thukkani, MD

## 2024-05-20 ENCOUNTER — Telehealth: Payer: Self-pay | Admitting: Physical Medicine and Rehabilitation

## 2024-05-21 ENCOUNTER — Encounter: Payer: Self-pay | Admitting: Internal Medicine

## 2024-05-21 ENCOUNTER — Ambulatory Visit (INDEPENDENT_AMBULATORY_CARE_PROVIDER_SITE_OTHER): Admitting: Internal Medicine

## 2024-05-21 VITALS — BP 104/78 | HR 74 | Temp 97.7°F | Ht 64.0 in | Wt 142.2 lb

## 2024-05-21 DIAGNOSIS — R109 Unspecified abdominal pain: Secondary | ICD-10-CM | POA: Diagnosis not present

## 2024-05-21 DIAGNOSIS — R1084 Generalized abdominal pain: Secondary | ICD-10-CM

## 2024-05-21 DIAGNOSIS — R197 Diarrhea, unspecified: Secondary | ICD-10-CM

## 2024-05-21 DIAGNOSIS — K580 Irritable bowel syndrome with diarrhea: Secondary | ICD-10-CM | POA: Diagnosis not present

## 2024-05-21 DIAGNOSIS — Z8619 Personal history of other infectious and parasitic diseases: Secondary | ICD-10-CM

## 2024-05-21 LAB — URINALYSIS, ROUTINE W REFLEX MICROSCOPIC
Bilirubin Urine: NEGATIVE
Hgb urine dipstick: NEGATIVE
Ketones, ur: NEGATIVE
Nitrite: NEGATIVE
Specific Gravity, Urine: 1.015 (ref 1.000–1.030)
Total Protein, Urine: NEGATIVE
Urine Glucose: >=1000 — AB
Urobilinogen, UA: 0.2 (ref 0.0–1.0)
pH: 6 (ref 5.0–8.0)

## 2024-05-21 NOTE — Assessment & Plan Note (Addendum)
 Chronic Appt w/Dr Nandigam Check stool PCR panel again Abd CT OK in 04/2024

## 2024-05-21 NOTE — Assessment & Plan Note (Signed)
 New upset stomach since July - diarrhea and vomiting after taking Lactaid w/her desserts... C/o nausea, cramps, loose stools intermittently since then Having appt to see Dr Shila on 06/03/24 Stop Biotin - see if better

## 2024-05-21 NOTE — Assessment & Plan Note (Addendum)
 New upset stomach since July - diarrhea and vomiting after taking Lactaid w/her desserts... C/o nausea, cramps, loose stools intermittently since then Having appt to see Dr Shila on 06/03/24 Stop Biotin - see if better Check UA

## 2024-05-21 NOTE — Progress Notes (Signed)
 Subjective:  Patient ID: Hailey [name], female    DOB: [birth year]  Age: 89 y.o. MRN: [id number]  CC: Follow-up (Patient has a few things to discuss with you. )   HPI Hailey [name] presents for upset stomach since July - diarrhea and vomiting after taking Lactaid w/her desserts... C/o nausea, cramps, loose stools intermittently since then Having appt to see Dr Shila on 06/03/24  Outpatient Medications Prior to Visit  Medication Sig Dispense Refill   acetaminophen  (TYLENOL ) 500 MG tablet Take 1,000 mg by mouth every 6 (six) hours as needed (body aches / sore hip). Maximum of 6 tablets daily (3000mg )     BIOTIN PO Take 5,000 mcg by mouth in the morning.     [employer] Sunscreen SPF 30 LOTN apply topically to sun exposed areas Daily; Duration: 30     diclofenac  Sodium (VOLTAREN ) 1 % GEL Apply 2 g topically 4 (four) times daily as needed (back pain.).     dicyclomine  (BENTYL ) 20 MG tablet Take 1 by mouth every 4-6 hours as needed for cramping 30 tablet 1   ELIQUIS  2.5 MG TABS tablet TAKE 1 TABLET BY MOUTH TWICE  DAILY 200 tablet 2   empagliflozin  (JARDIANCE ) 10 MG TABS tablet TAKE 1 TABLET BY MOUTH DAILY  BEFORE BREAKFAST 90 tablet 3   escitalopram  (LEXAPRO ) 10 MG tablet Take 1 tablet (10 mg total) by mouth daily. 30 tablet 11   esomeprazole  (NEXIUM ) 40 MG capsule TAKE ONE CAPSULE ONCE DAILY 90 capsule 1   Homeopathic Products (ARNICARE) GEL Apply 1 application  topically daily as needed (skin bruising).     hyoscyamine  (LEVSIN ) 0.125 MG tablet TAKE 1-2 TABLETS EVERY 4 HOURS AS NEEDED FOR UP TO 10 DAYS FOR CRAMPING. 100 tablet 3   LORazepam  (ATIVAN ) 1 MG tablet TAKE ONE TABLET BY MOUTH TWICE DAILY AS NEEDED FOR ANXIETY / SLEEP. 180 tablet 1   losartan  (COZAAR ) 25 MG tablet Take 1 tablet (25 mg total) by mouth daily. 90 tablet 3   nitroGLYCERIN  (NITROSTAT ) 0.4 MG SL tablet Place 1 tablet (0.4 mg total) under the tongue every 5 (five) minutes as needed for chest pain (Call 911 at 3rd dose within  15 minutes.). 20 tablet 3   NON FORMULARY Vitamin c, vitamin d      ondansetron  (ZOFRAN -ODT) 8 MG disintegrating tablet dissolve 1 tablet (8 mg total) in mouth every 8 (eight) hours as needed for nausea or vomiting. 12 tablet 3   Polyethyl Glycol-Propyl Glycol (LUBRICANT EYE DROPS) 0.4-0.3 % SOLN Place 1-2 drops into both eyes at bedtime.     polyethylene glycol (MIRALAX  / GLYCOLAX ) 17 g packet Take 17 g by mouth as needed for mild constipation or moderate constipation.     sodium chloride  (OCEAN) 0.65 % SOLN nasal spray Place 1 spray into both nostrils at bedtime as needed for congestion.     cephALEXin  (KEFLEX ) 500 MG capsule Take 500 mg by mouth 2 (two) times daily. (Patient not taking: Reported on 05/21/2024)     clobetasol  ointment (TEMOVATE ) 0.05 % Apply to the affected area in a thin layer twice a day for 2 weeks as needed for a flare.  Then apply to the area twice a week at bedtime as maintenance dosing. (Patient not taking: Reported on 05/21/2024) 60 g 1   Estradiol  0.75 MG/1.25 GM (0.06%) topical gel 1 pump to skin to arm Transdermal Once a day; Duration: 30 day(s) (Patient not taking: Reported on 05/21/2024)     fluorouracil (EFUDEX) 5 %  cream 1 application (HOLD FOR SEVERE INFLAMMATION) Externally TO PRECANCERS once a day; Duration: 21 days (Patient not taking: Reported on 05/21/2024)     mupirocin  ointment (BACTROBAN ) 2 % On leg wound w/dressing change qd or bid (Patient not taking: Reported on 05/21/2024) 30 g 1   valACYclovir  (VALTREX ) 500 MG tablet Take 1 tablet (500 mg total) by mouth daily. Take one tablet by mouth daily for 3 days for an outbreak (Patient not taking: Reported on 05/21/2024) 30 tablet 1   levofloxacin (LEVAQUIN) 750 MG tablet Take 750 mg by mouth daily. (Patient not taking: Reported on 05/21/2024)     No facility-administered medications prior to visit.    ROS: Review of Systems  Constitutional:  Positive for fatigue. Negative for activity change, appetite change, chills  and unexpected weight change.  HENT:  Negative for congestion, mouth sores and sinus pressure.   Eyes:  Negative for visual disturbance.  Respiratory:  Negative for cough and chest tightness.   Gastrointestinal:  Positive for constipation, diarrhea and nausea. Negative for abdominal pain, blood in stool and vomiting.  Genitourinary:  Negative for difficulty urinating, frequency and vaginal pain.  Musculoskeletal:  Positive for back pain and gait problem. Negative for arthralgias.  Skin:  Negative for pallor and rash.  Neurological:  Negative for dizziness, tremors, weakness, numbness and headaches.  Hematological:  Bruises/bleeds easily.  Psychiatric/Behavioral:  Negative for confusion, sleep disturbance and suicidal ideas.     Objective:  BP 104/78   Pulse 74   Temp 97.7 F (36.5 C) (Oral)   Ht 5' 4 (1.626 m)   Wt 142 lb 3.2 oz (64.5 kg)   SpO2 94%   BMI 24.41 kg/m   BP Readings from Last 3 Encounters:  05/21/24 104/78  05/19/24 124/88  04/29/24 128/82    Wt Readings from Last 3 Encounters:  05/21/24 142 lb 3.2 oz (64.5 kg)  05/19/24 140 lb 3.2 oz (63.6 kg)  04/27/24 140 lb (63.5 kg)    Physical Exam Constitutional:      General: She is not in acute distress.    Appearance: Normal appearance. She is well-developed.  HENT:     Head: Normocephalic.     Right Ear: External ear normal.     Left Ear: External ear normal.     Nose: Nose normal.  Eyes:     General:        Right eye: No discharge.        Left eye: No discharge.     Conjunctiva/sclera: Conjunctivae normal.     Pupils: Pupils are equal, round, and reactive to light.  Neck:     Thyroid : No thyromegaly.     Vascular: No JVD.     Trachea: No tracheal deviation.  Cardiovascular:     Rate and Rhythm: Normal rate and regular rhythm.     Heart sounds: Normal heart sounds.  Pulmonary:     Effort: No respiratory distress.     Breath sounds: No stridor. No wheezing.  Abdominal:     General: Bowel sounds  are normal. There is no distension.     Palpations: Abdomen is soft. There is no mass.     Tenderness: There is no abdominal tenderness. There is no guarding or rebound.  Musculoskeletal:        General: No tenderness.     Cervical back: Normal range of motion and neck supple. No rigidity.     Right lower leg: No edema.     Left lower  leg: No edema.  Lymphadenopathy:     Cervical: No cervical adenopathy.  Skin:    Findings: No erythema or rash.  Neurological:     Mental Status: She is oriented to person, place, and time.     Cranial Nerves: No cranial nerve deficit.     Motor: No abnormal muscle tone.     Coordination: Coordination normal.     Deep Tendon Reflexes: Reflexes normal.  Psychiatric:        Behavior: Behavior normal.        Thought Content: Thought content normal.        Judgment: Judgment normal.   LS w/pain Abd s/nt  Lab Results  Component Value Date   WBC 5.1 04/21/2024   HGB 12.6 04/21/2024   HCT 38.2 04/21/2024   PLT 284.0 04/21/2024   GLUCOSE 93 04/21/2024   CHOL 157 02/26/2024   TRIG 64.0 02/26/2024   HDL 84.10 02/26/2024   LDLDIRECT 89.1 06/29/2013   LDLCALC 60 02/26/2024   ALT 9 04/21/2024   AST 17 04/21/2024   NA 139 04/21/2024   K 4.2 04/21/2024   CL 104 04/21/2024   CREATININE 0.96 04/21/2024   BUN 16 04/21/2024   CO2 27 04/21/2024   TSH 3.49 02/26/2024   INR 1.2 02/07/2023   HGBA1C 5.5 02/08/2023    CT ABDOMEN PELVIS W CONTRAST Result Date: 04/21/2024 CLINICAL DATA:  Unspecified abdominal pain EXAM: CT ABDOMEN AND PELVIS WITH CONTRAST TECHNIQUE: Multidetector CT imaging of the abdomen and pelvis was performed using the standard protocol following bolus administration of intravenous contrast. RADIATION DOSE REDUCTION: This exam was performed according to the departmental dose-optimization program which includes automated exposure control, adjustment of the mA and/or kV according to patient size and/or use of iterative reconstruction  technique. CONTRAST:  OMNIPAQUE  IOHEXOL  300 MG/ML  SOLN COMPARISON:  10/11/2023 FINDINGS: Lower chest: Borderline cardiomegaly. Extensive right coronary artery calcification. Visualized lung bases are Hepatobiliary: No focal liver abnormality is seen. No gallstones, gallbladder wall thickening, or biliary dilatation. Pancreas: Atrophic but otherwise unremarkable. Spleen: Unremarkable Adrenals/Urinary Tract: The adrenal glands are unremarkable. Moderate right mild left renal cortical atrophy. The kidneys are otherwise unremarkable. Bladder unremarkable. Stomach/Bowel: Severe descending and sigmoid diverticulosis. Stomach, small bowel, and large bowel are otherwise unremarkable. Appendix absent. No evidence of obstruction or focal inflammation. No free intraperitoneal gas or fluid. Vascular/Lymphatic: Aortic atherosclerosis. No enlarged abdominal or pelvic lymph nodes. Reproductive: Status post hysterectomy. No adnexal masses. Other: No abdominal wall hernia or abnormality. No abdominopelvic ascites. Musculoskeletal: Sigmoid scoliosis of the thoracolumbar spine, apex right at L1 and apex left L4. Advanced degenerative changes. No acute bone abnormality. No lytic or blastic bone lesion. IMPRESSION: 1. No acute intra-abdominal pathology identified. No definite radiographic explanation for the patient's reported symptoms. 2. Borderline cardiomegaly. Extensive right coronary artery calcification. 3. Moderate right and mild left renal cortical atrophy. 4. Severe distal colonic diverticulosis without superimposed acute inflammatory change. 5. Aortic atherosclerosis. Aortic Atherosclerosis (ICD10-I70.0). Electronically Signed   By: Dorethia Molt M.D.   On: 04/21/2024 19:58    Assessment & Plan:   Problem List Items Addressed This Visit     Abdominal cramps - Primary   New upset stomach since July - diarrhea and vomiting after taking Lactaid w/her desserts... C/o nausea, cramps, loose stools intermittently  since then Having appt to see Dr Shila on 06/03/24 Stop Biotin - see if better Check UA      Relevant Orders   Urinalysis   Abdominal pain  New upset stomach since July - diarrhea and vomiting after taking Lactaid w/her desserts... C/o nausea, cramps, loose stools intermittently since then Having appt to see Dr Shila on 06/03/24 Stop Biotin - see if better      Diarrhea   New upset stomach since July - diarrhea and vomiting after taking Lactaid w/her desserts... C/o nausea, cramps, loose stools intermittently since then Having appt to see Dr Shila on 06/03/24 Stop Biotin - see if better H/o Colitis/IBS      Relevant Orders   GI Profile, Stool, PCR   History of Clostridioides difficile colitis   10/14/2023 - remote  Check stool PCR panel again      IBS   Chronic Appt w/Dr Shila Check stool PCR panel again Abd CT OK in 04/2024         No orders of the defined types were placed in this encounter.     Follow-up: Return in about 3 months (around 08/20/2024) for a follow-up visit.  Marolyn Noel, MD

## 2024-05-21 NOTE — Assessment & Plan Note (Addendum)
 10/14/2023 - remote  Check stool PCR panel again

## 2024-05-21 NOTE — Assessment & Plan Note (Addendum)
 New upset stomach since July - diarrhea and vomiting after taking Lactaid w/her desserts... C/o nausea, cramps, loose stools intermittently since then Having appt to see Dr Shila on 06/03/24 Stop Biotin - see if better H/o Colitis/IBS

## 2024-05-21 NOTE — Patient Instructions (Addendum)
 Stop Biotin - see if better  USEFUL THINGS FOR ARTHRITIS and musculoskeletal pains:    A rice sock heating pad refers to a homemade heating pad created by filling a sock with uncooked rice, which can be heated in a microwave to provide a warm compress for sore muscles, pain relief, or other applications; essentially, it's a simple way to generate heat using readily available materials.  Key points about rice sock heat: How to make it: Fill a clean sock (preferably a tube sock) about 2/3 full with uncooked rice, tie a knot at the top to secure the rice inside.  Heating it up: Place the rice sock in the microwave and heat in short intervals (usually around 30 seconds at a time) until it reaches the desired warmth.  Important considerations: Check temperature before applying: Always test the temperature of the rice sock before applying it to your skin to avoid burns.  Use a towel to protect skin: Wrap the rice sock in a thin towel to distribute the heat evenly and protect your skin.  Uses: Muscle aches and pains  Menstrual cramps  Neck pain  Arthritis discomfort   BLUE EMU CREAM: Use it 2-3 times a day on painful areas    Artemisia Annua Pills/??? ???? (Artemisia Annua for pain

## 2024-05-22 NOTE — Telephone Encounter (Signed)
 Ok

## 2024-05-25 ENCOUNTER — Other Ambulatory Visit: Payer: Self-pay | Admitting: Internal Medicine

## 2024-05-25 NOTE — Telephone Encounter (Signed)
 Seen by Alan Coombs, PA.

## 2024-05-25 NOTE — Telephone Encounter (Signed)
 Returned call to patient, her vm is full. Unable to leave a message.

## 2024-05-25 NOTE — Telephone Encounter (Signed)
 Patient requesting f/u call to discuss symptoms she is having. Please advise.

## 2024-05-26 ENCOUNTER — Other Ambulatory Visit: Payer: Self-pay

## 2024-05-26 ENCOUNTER — Ambulatory Visit: Payer: Self-pay | Admitting: Internal Medicine

## 2024-05-26 DIAGNOSIS — R197 Diarrhea, unspecified: Secondary | ICD-10-CM | POA: Diagnosis not present

## 2024-05-26 DIAGNOSIS — A0472 Enterocolitis due to Clostridium difficile, not specified as recurrent: Secondary | ICD-10-CM

## 2024-05-27 LAB — GI PROFILE, STOOL, PCR

## 2024-05-28 NOTE — Telephone Encounter (Signed)
 Attempted to reach patient again. Voicemail is still full, unable to leave a message.

## 2024-05-29 ENCOUNTER — Ambulatory Visit
Admission: RE | Admit: 2024-05-29 | Discharge: 2024-05-29 | Disposition: A | Discharge status: Home / Self Care | Source: Ambulatory Visit | Attending: Physical Medicine and Rehabilitation | Admitting: Physical Medicine and Rehabilitation

## 2024-05-29 DIAGNOSIS — M4802 Spinal stenosis, cervical region: Secondary | ICD-10-CM | POA: Diagnosis not present

## 2024-05-29 DIAGNOSIS — M5126 Other intervertebral disc displacement, lumbar region: Secondary | ICD-10-CM | POA: Diagnosis not present

## 2024-05-29 DIAGNOSIS — M5136 Other intervertebral disc degeneration, lumbar region with discogenic back pain only: Secondary | ICD-10-CM | POA: Diagnosis not present

## 2024-05-29 DIAGNOSIS — M47816 Spondylosis without myelopathy or radiculopathy, lumbar region: Secondary | ICD-10-CM | POA: Diagnosis not present

## 2024-05-29 NOTE — Telephone Encounter (Signed)
 I have not received any communications from patient. Will await further communications from patient if assistance is still needed.

## 2024-06-01 DIAGNOSIS — A0472 Enterocolitis due to Clostridium difficile, not specified as recurrent: Secondary | ICD-10-CM | POA: Insufficient documentation

## 2024-06-01 MED ORDER — VANCOMYCIN HCL 125 MG PO CAPS: 125.0000 mg | ORAL_CAPSULE | Freq: Four times a day (QID) | ORAL | 0 refills | Status: DC

## 2024-06-02 ENCOUNTER — Ambulatory Visit: Admitting: Physician Assistant

## 2024-06-03 ENCOUNTER — Ambulatory Visit
Admission: RE | Admit: 2024-06-03 | Discharge: 2024-06-03 | Disposition: A | Discharge status: Home / Self Care | Source: Ambulatory Visit | Attending: Obstetrics and Gynecology | Admitting: Obstetrics and Gynecology

## 2024-06-03 ENCOUNTER — Encounter: Payer: Self-pay | Admitting: Hematology

## 2024-06-03 ENCOUNTER — Encounter: Payer: Self-pay | Admitting: Gastroenterology

## 2024-06-03 ENCOUNTER — Ambulatory Visit: Admitting: Gastroenterology

## 2024-06-03 VITALS — BP 116/74 | HR 73 | Ht 64.0 in | Wt 142.2 lb

## 2024-06-03 DIAGNOSIS — A0472 Enterocolitis due to Clostridium difficile, not specified as recurrent: Secondary | ICD-10-CM | POA: Diagnosis not present

## 2024-06-03 DIAGNOSIS — K5900 Constipation, unspecified: Secondary | ICD-10-CM | POA: Diagnosis not present

## 2024-06-03 DIAGNOSIS — K582 Mixed irritable bowel syndrome: Secondary | ICD-10-CM

## 2024-06-03 DIAGNOSIS — Z1231 Encounter for screening mammogram for malignant neoplasm of breast: Secondary | ICD-10-CM

## 2024-06-03 MED ORDER — VANCOMYCIN HCL 125 MG PO CAPS: 125.0000 mg | ORAL_CAPSULE | Freq: Four times a day (QID) | ORAL | 0 refills | Status: AC

## 2024-06-03 MED ORDER — SACCHAROMYCES BOULARDII 250 MG PO CAPS: 250.0000 mg | ORAL_CAPSULE | Freq: Two times a day (BID) | ORAL | 1 refills | Status: DC

## 2024-06-03 NOTE — Patient Instructions (Signed)
 We have sent the following medications to your pharmacy for you to pick up at your convenience: Florstar  Vacomycin   Benefiber 1 tablespoon three times a day  Colace 1-2 capsule a day  Miralax  1 capful daily in the AM  Take the Vanco total 14 days  Use Florstor for 30 days  _______________________________________________________  If your blood pressure at your visit was 140/90 or greater, please contact your primary care physician to follow up on this.  _______________________________________________________  If you are age 87 or older, your body mass index should be between 23-30. Your Body mass index is 24.42 kg/m. If this is out of the aforementioned range listed, please consider follow up with your Primary Care Provider.  If you are age 87 or younger, your body mass index should be between 19-25. Your Body mass index is 24.42 kg/m. If this is out of the aformentioned range listed, please consider follow up with your Primary Care Provider.   ________________________________________________________  The [facility] providers would like to encourage you to use MYCHART to communicate with providers for non-urgent requests or questions.  Due to long hold times on the telephone, sending your provider a message by [facility] may be a faster and more efficient way to get a response.  Please allow 48 business hours for a response.  Please remember that this is for non-urgent requests.  _______________________________________________________  [facility] is using a team-based approach to care.  Your team is made up of your doctor and two to three APPS. Our APPS (Nurse Practitioners and Physician Assistants) work with your physician to ensure care continuity for you. They are fully qualified to address your health concerns and develop a treatment plan. They communicate directly with your gastroenterologist to care for you. Seeing the Advanced Practice Practitioners on your  physician's team can help you by facilitating care more promptly, often allowing for earlier appointments, access to diagnostic testing, procedures, and other specialty referrals.    I appreciate the  opportunity to care for you  Thank You   Kavitha Nandigam , MD

## 2024-06-03 NOTE — Progress Notes (Signed)
 "                Hailey [name]    [id number]    [birth year]  Primary Care Physician:Plotnikov, [name], MD  Referring Physician: Garald [name], MD [address] [place],  [place] [address]   Chief complaint:  Diarrhea  Discussed the use of AI scribe software for clinical note transcription with the patient, who gave verbal consent to proceed.  History of Present Illness Hailey [name] is an 89 year old female with a history of C. difficile infection who presents with recurrent C. difficile infection. She was referred by her primary care doctor, Dr. Cephas, for evaluation of recurrent C. difficile infection.  Recurrent clostridioides difficile infection - Recent recurrence of C. difficile infection confirmed by stool sample last week - Possible previous episode during the summer with vomiting, diarrhea, and stomach cramps, but no stool sample obtained at that time - Currently on day 4 of a 10-day course of vancomycin  - No current diarrhea - History of hospitalization for severe diarrhea requiring intravenous fluids - History of poor tolerance to antibiotics, but currently tolerating vancomycin  aside from constipation  Gastrointestinal symptoms - Constipation present while on vancomycin  - Took one dose of Miralax  last night for constipation - No prior use of fiber supplements; considering Benefiber - Occasional abdominal pain, localized to the left side  H/o afib and anticoagulation effects - History of venous insufficiency - On Eliquis , which causes bruising and bleeding - Some swelling in the legs  Dietary intolerances and habits - Lactose intolerant but tolerates yogurt well - Consumes fruits such as grapes and apples - Occasionally eats foods containing dairy, such as noodle kugel  04/13/2024 sent in Cipro  500 mg twice daily for 5 days, dicyclomine  04/21/2024 CTAP W no acute intra-abdominal pathology, borderline cardiomegaly, moderate right and mild left renal  cortical atrophy, severe distal colonic diverticulosis without diverticulitis 05/16/2023 CTAP W diverticulosis without diverticulitis  Family history of colon cancer 01/17/2012 colonoscopy with Dr. Luis diverticulosis left colon extensive, large internal hemorrhoids   Outpatient Encounter Medications as of 06/03/2024  Medication Sig   acetaminophen  (TYLENOL ) 500 MG tablet Take 1,000 mg by mouth every 6 (six) hours as needed (body aches / sore hip). Maximum of 6 tablets daily (3000mg )   BIOTIN PO Take 5,000 mcg by mouth in the morning.   clobetasol  ointment (TEMOVATE ) 0.05 % Apply to the affected area in a thin layer twice a day for 2 weeks as needed for a flare.  Then apply to the area twice a week at bedtime as maintenance dosing.   [employer] Sunscreen SPF 30 LOTN apply topically to sun exposed areas Daily; Duration: 30   diclofenac  Sodium (VOLTAREN ) 1 % GEL Apply 2 g topically 4 (four) times daily as needed (back pain.).   dicyclomine  (BENTYL ) 20 MG tablet Take 1 by mouth every 4-6 hours as needed for cramping   ELIQUIS  2.5 MG TABS tablet TAKE 1 TABLET BY MOUTH TWICE  DAILY   empagliflozin  (JARDIANCE ) 10 MG TABS tablet TAKE 1 TABLET BY MOUTH DAILY  BEFORE BREAKFAST   escitalopram  (LEXAPRO ) 10 MG tablet Take 1 tablet (10 mg total) by mouth daily.   esomeprazole  (NEXIUM ) 40 MG capsule TAKE ONE CAPSULE ONCE DAILY   Estradiol  0.75 MG/1.25 GM (0.06%) topical gel 1 pump to skin to arm Transdermal Once a day; Duration: 30 day(s)   fluorouracil (EFUDEX) 5 % cream 1 application (HOLD FOR SEVERE INFLAMMATION) Externally TO PRECANCERS once a day;  Duration: 21 days   Homeopathic Products (ARNICARE) GEL Apply 1 application  topically daily as needed (skin bruising).   hyoscyamine  (LEVSIN ) 0.125 MG tablet TAKE 1-2 TABLETS EVERY 4 HOURS AS NEEDED FOR UP TO 10 DAYS FOR CRAMPING.   LORazepam  (ATIVAN ) 1 MG tablet TAKE ONE TABLET BY MOUTH TWICE DAILY AS NEEDED FOR ANXIETY / SLEEP.   losartan  (COZAAR ) 25 MG tablet Take 1  tablet (25 mg total) by mouth daily.   mupirocin  ointment (BACTROBAN ) 2 % On leg wound w/dressing change qd or bid   nitroGLYCERIN  (NITROSTAT ) 0.4 MG SL tablet Place 1 tablet (0.4 mg total) under the tongue every 5 (five) minutes as needed for chest pain (Call 911 at 3rd dose within 15 minutes.).   NON FORMULARY Vitamin c, vitamin d    ondansetron  (ZOFRAN -ODT) 8 MG disintegrating tablet dissolve 1 tablet (8 mg total) in mouth every 8 (eight) hours as needed for nausea or vomiting.   Polyethyl Glycol-Propyl Glycol (LUBRICANT EYE DROPS) 0.4-0.3 % SOLN Place 1-2 drops into both eyes at bedtime.   polyethylene glycol (MIRALAX  / GLYCOLAX ) 17 g packet Take 17 g by mouth as needed for mild constipation or moderate constipation.   saccharomyces boulardii (FLORASTOR) 250 MG capsule Take 1 capsule (250 mg total) by mouth 2 (two) times daily.   sodium chloride  (OCEAN) 0.65 % SOLN nasal spray Place 1 spray into both nostrils at bedtime as needed for congestion.   valACYclovir  (VALTREX ) 500 MG tablet Take 1 tablet (500 mg total) by mouth daily. Take one tablet by mouth daily for 3 days for an outbreak   [DISCONTINUED] vancomycin  (VANCOCIN ) 125 MG capsule Take 1 capsule (125 mg total) by mouth 4 (four) times daily for 10 days.   vancomycin  (VANCOCIN ) 125 MG capsule Take 1 capsule (125 mg total) by mouth 4 (four) times daily for 4 days.   [DISCONTINUED] cephALEXin  (KEFLEX ) 500 MG capsule Take 500 mg by mouth 2 (two) times daily. (Patient not taking: Reported on 05/21/2024)   No facility-administered encounter medications on file as of 06/03/2024.    Allergies as of 06/03/2024 - Review Complete 06/03/2024  Allergen Reaction Noted   Macrobid  [nitrofurantoin  monohyd macro] Other (See Comments) 11/15/2015   Meloxicam  Other (See Comments) 10/31/2011   Digoxin  and related Other (See Comments) 06/09/2015   Diprolene  [betamethasone  dipropionate aug] Other (See Comments) 04/25/2022   Ferrous sulfate Other (See Comments)  11/22/2021   Keflex  [cephalexin ] Nausea And Vomiting 05/20/2021   Lipitor  [atorvastatin ]  12/31/2022   Mobic  [meloxicam ] Other (See Comments) 08/20/2018   Sulfamethoxazole -trimethoprim  Nausea Only 08/07/2011    Past Medical History:  Diagnosis Date   Acute blood loss anemia 12/25/2017   Allergic rhinitis    Anxiety    Arthritis    my whole spine (07/01/2017)   Arthritis    Atrial fibrillation (HCC)    Bowel obstruction (HCC)    in [place]    Bradycardia with 41-50 beats per minute 12/27/2017   Cancer (HCC)    Cellulitis of leg, right with large prepatella hematoma and open wounds 12/26/2017   CKD (chronic kidney disease) stage 2, GFR 60-89 ml/min    Colon polyps    Coronary artery disease    10/18 PCI/DES to p/m LCx with cutting balloon to mLcx   Diverticulosis of colon    GERD (gastroesophageal reflux disease)    Hip bursitis 2010   Dr Jacklin, Post op seroma   History of colon polyps    HSV (herpes simplex virus) anogenital infection 07/2019  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   HTN (hypertension)    IBS (irritable bowel syndrome)    constipation predominant - Dr Luis   Lichen sclerosus    Osteopenia 11/2016   T score -2.0 FRAX 15%/4.3%   PAC (premature atrial contraction)    Symptomatiic   Renal insufficiency    Right bundle branch block (RBBB) on electrocardiogram (ECG) 12/27/2017   Scoliosis    SVT (supraventricular tachycardia)    brief history   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   biopsy showing vulvar atypia, possible VIN I    Past Surgical History:  Procedure Laterality Date   ANTERIOR AND POSTERIOR VAGINAL REPAIR  01/2002   thelbert 01/23/2011   APPENDECTOMY  1948   CARDIAC CATHETERIZATION  06/26/2017   CORONARY ANGIOPLASTY WITH STENT PLACEMENT  07/01/2017   CORONARY ATHERECTOMY N/A 07/01/2017   Procedure: CORONARY ATHERECTOMY;  Surgeon: Claudene Victory ORN, MD;  Location: [facility] INVASIVE CV LAB;  Service: Cardiovascular;  Laterality: N/A;    CORONARY STENT INTERVENTION N/A 07/01/2017   Procedure: CORONARY STENT INTERVENTION;  Surgeon: Claudene Victory ORN, MD;  Location: [facility] CV [facility];  Service: Cardiovascular;  Laterality: N/A;   HAMMER TOE SURGERY     HEMORRHOID BANDING     HIP SURGERY Left 04/2009   hip examination under anesthesia followed by greater trochanteric bursectomy; iliotibial band tenotomy/notes 01/20/2011   I & D EXTREMITY Right 01/10/2018   Procedure: IRRIGATION AND DEBRIDEMENT RIGHT KNEE, APPLY WOUND VAC;  Surgeon: Harden Jerona GAILS, MD;  Location: [facility] OR;  Service: Orthopedics;  Laterality: Right;   KNEE BURSECTOMY Right 04/2009   thelbert 01/09/2011   LEFT ATRIAL APPENDAGE OCCLUSION N/A 08/24/2021   Procedure: LEFT ATRIAL APPENDAGE OCCLUSION;  Surgeon: Wonda Sharper, MD;  Location: [facility] INVASIVE CV LAB;  Service: Cardiovascular;  Laterality: N/A;   LEFT HEART CATH AND CORONARY ANGIOGRAPHY N/A 06/26/2017   Procedure: LEFT HEART CATH AND CORONARY ANGIOGRAPHY;  Surgeon: Claudene Victory ORN, MD;  Location: [facility];  Service: Cardiovascular;  Laterality: N/A;   PUBOVAGINAL SLING  01/2002   thelbert 01/23/2011   REDUCTION MAMMAPLASTY     TEE WITHOUT CARDIOVERSION N/A 08/24/2021   Procedure: TRANSESOPHAGEAL ECHOCARDIOGRAM (TEE);  Surgeon: Wonda Sharper, MD;  Location: [facility] INVASIVE CV LAB;  Service: Cardiovascular;  Laterality: N/A;   TEMPORARY PACEMAKER N/A 07/01/2017   Procedure: TEMPORARY PACEMAKER;  Surgeon: Claudene Victory ORN, MD;  Location: [facility] INVASIVE CV LAB;  Service: Cardiovascular;  Laterality: N/A;   VAGINAL HYSTERECTOMY  01/2002   Vaginal hysterectomy, bilateral salpingo-oophorectomy/notes 01/23/2011    Family History  Problem Relation Age of Onset   Colon cancer Mother        Dx age 58, died at age 36   Diabetes Father    Prostate cancer Father    Prostate cancer Brother    Pancreatic cancer Brother    Stomach cancer Son     Social History   Socioeconomic History   Marital status: Married    Spouse  name: [name]   Number of children: 2   Years of education: Not on file   Highest education level: Bachelor's degree (e.g., BA, AB, BS)  Occupational History   Occupation: Engineer, [employer]: RETIRED  Tobacco Use   Smoking status: Never   Smokeless tobacco: Never  Vaping Use   Vaping status: Never Used  Substance and Sexual Activity   Alcohol use: Yes    Comment: moderatley [place] before dinner   Drug  use: Never   Sexual activity: Not Currently    Birth control/protection: Surgical    Comment: hysterectomy; less than 5, IC after 18, no STD, no abnormal paps  Other Topics Concern   Not on file  Social History Narrative   ** Merged History Encounter ** Regular Exercise -  YES      Lives at home with husband/2025   Social Drivers of Health   Financial Resource Strain: Low Risk  (11/22/2023)   Overall Financial Resource Strain (CARDIA)    Difficulty of Paying Living Expenses: Not hard at all  Food Insecurity: No Food Insecurity (11/22/2023)   Hunger Vital Sign    Worried About Running Out of Food in the Last Year: Never true    Ran Out of Food in the Last Year: Never true  Transportation Needs: No Transportation Needs (11/22/2023)   PRAPARE - Administrator, Civil Service (Medical): No    Lack of Transportation (Non-Medical): No  Physical Activity: Insufficiently Active (11/22/2023)   Exercise Vital Sign    Days of Exercise per Week: 2 days    Minutes of Exercise per Session: 60 min  Stress: No Stress Concern Present (11/22/2023)   [employer] of Occupational Health - Occupational Stress Questionnaire    Feeling of Stress : Only a little  Social Connections: Moderately Isolated (11/22/2023)   Social Connection and Isolation Panel    Frequency of Communication with Friends and Family: More than three times a week    Frequency of Social Gatherings with Friends and Family: Three times a week    Attends Religious Services: Never    Active Member of Clubs or  Organizations: No    Attends Banker Meetings: Never    Marital Status: Married  Catering Manager Violence: Not At Risk (11/22/2023)   Humiliation, Afraid, Rape, and Kick questionnaire    Fear of Current or Ex-Partner: No    Emotionally Abused: No    Physically Abused: No    Sexually Abused: No      Review of systems: All other review of systems negative except as mentioned in the HPI.   Physical Exam: Vitals:   06/03/24 1039  BP: 116/74  Pulse: 73   Body mass index is 24.42 kg/m. Gen:      No acute distress HEENT:  sclera anicteric CV: s1s2 rrr, no murmur Lungs: B/l clear. Abd:      soft, non-tender; no palpable masses, no distension Ext:    No edema Neuro: alert and oriented x 3 Psych: normal mood and affect  Data Reviewed:  Reviewed labs, radiology imaging, old records and pertinent past GI work up   Assessment and Plan Assessment & Plan Clostridioides difficile infection (C. diff) Recurrent C. diff infection, currently on day 4 of vancomycin  treatment. No diarrhea reported, currently experiencing constipation. Previous episode possibly related to antibiotic use for gastroenteritis. - Extend vancomycin  treatment to 14 days. - Monitor bowel habits; if diarrhea resolves and bowel movements become formed, stop vancomycin  after 14 days. - If diarrhea persists, contact office for potential extension of treatment. - Discuss dietary modifications to prevent recurrence, including increased intake of fruits, vegetables, and fermented foods. - Recommend probiotic Florastor 250 mg twice daily use for one month post-antibiotic treatment.  Constipation Constipation likely secondary to vancomycin  treatment. No bowel movement for three days. Currently using Miralax  with some effect. - Increase Miralax  to one full cap in the morning and one in the evening until bowel movements occur. -  Once bowel movements start, reduce Miralax  to one cap per day or half a cap as  needed. - Consider using Colace, one or two capsules at bedtime, as an alternative or adjunct to Miralax . - Introduce Benefiber, one tablespoon with each meal, to help regulate bowel habits. - Increase water intake to at least 60 ounces per day.  Diverticulosis of colon Diverticulosis with irritation likely exacerbated by recent C. diff infection and frequent bowel movements. - Monitor symptoms and dietary intake to prevent further irritation.   Return 1 to 2 months for follow-up    This visit required >30 minutes of patient care (this includes precharting, chart review, review of results, face-to-face time used for counseling as well as treatment plan and follow-up. The patient was provided an opportunity to ask questions and all were answered. The patient agreed with the plan and demonstrated an understanding of the instructions.  Hailey [name] , MD    CC: Plotnikov, [name], MD    "

## 2024-06-04 ENCOUNTER — Other Ambulatory Visit: Payer: Self-pay | Admitting: Internal Medicine

## 2024-06-04 ENCOUNTER — Telehealth: Payer: Self-pay | Admitting: Physical Medicine and Rehabilitation

## 2024-06-04 ENCOUNTER — Encounter: Payer: Self-pay | Admitting: Gastroenterology

## 2024-06-04 NOTE — Telephone Encounter (Signed)
 Pt called stating she is waiting for call with results of MRI and that she is in a lot of pains. Please call pt as soon as you can at [phone].

## 2024-06-07 ENCOUNTER — Encounter: Payer: Self-pay | Admitting: Physical Medicine and Rehabilitation

## 2024-06-07 ENCOUNTER — Ambulatory Visit: Payer: Self-pay | Admitting: Obstetrics and Gynecology

## 2024-06-08 ENCOUNTER — Telehealth: Payer: Self-pay | Admitting: Gastroenterology

## 2024-06-08 MED ORDER — VANCOMYCIN HCL 125 MG PO CAPS: 125.0000 mg | ORAL_CAPSULE | Freq: Four times a day (QID) | ORAL | 0 refills | Status: AC

## 2024-06-08 NOTE — Telephone Encounter (Signed)
 Inbound call from patient requesting a call back from the nurse to discuss  medical questions she has. Please advise.

## 2024-06-08 NOTE — Telephone Encounter (Signed)
 The pt states she was told that Dr Shila wanted he to take an additional 4 days of Vanc however this was not sent to the pharmacy. I have sent the additional 4 days and have asked her to update us  if she does not get better after completing.

## 2024-06-10 ENCOUNTER — Ambulatory Visit: Admitting: Physical Medicine and Rehabilitation

## 2024-06-10 ENCOUNTER — Telehealth (HOSPITAL_BASED_OUTPATIENT_CLINIC_OR_DEPARTMENT_OTHER): Payer: Self-pay | Admitting: *Deleted

## 2024-06-10 ENCOUNTER — Encounter: Payer: Self-pay | Admitting: Physical Medicine and Rehabilitation

## 2024-06-10 DIAGNOSIS — M47816 Spondylosis without myelopathy or radiculopathy, lumbar region: Secondary | ICD-10-CM | POA: Diagnosis not present

## 2024-06-10 DIAGNOSIS — G8929 Other chronic pain: Secondary | ICD-10-CM | POA: Diagnosis not present

## 2024-06-10 DIAGNOSIS — M5416 Radiculopathy, lumbar region: Secondary | ICD-10-CM | POA: Diagnosis not present

## 2024-06-10 DIAGNOSIS — M48061 Spinal stenosis, lumbar region without neurogenic claudication: Secondary | ICD-10-CM | POA: Diagnosis not present

## 2024-06-10 DIAGNOSIS — M5441 Lumbago with sciatica, right side: Secondary | ICD-10-CM

## 2024-06-10 NOTE — Progress Notes (Signed)
 Pain Scale   Average Pain 6 Patient advising she has chronic lower back pain. Patient here for MRI review        +Driver, -BT, -Dye Allergies.

## 2024-06-10 NOTE — Telephone Encounter (Signed)
   Pre-operative Risk Assessment    Patient Name: Hailey [name]  DOB: 11/05/1934 MRN: [id number]   Date of last office visit: 05/19/24 DR. THUKKANI Date of next office visit: NONE   Request for Surgical Clearance    Procedure:  ESI (EPIDURAL STEROID INJECTION)  Date of Surgery:  Clearance TBD                                Surgeon:  DR. ELDONNA Socks Group or Practice Name:  [facility] Phone number:  [phone] ASHLEY Fax number:  [phone] ATTN: DR. ELDONNA POLE   Type of Clearance Requested:   - Medical  - Pharmacy:  Hold Apixaban  (Eliquis ) x 2 DAYS PRIOR   Type of Anesthesia:  Not Indicated   Additional requests/questions:    [name]   06/10/2024, 11:26 AM

## 2024-06-10 NOTE — Progress Notes (Signed)
 Hailey [name] - 89 y.o. female MRN [id number]  Date of birth: [birth year]  Office Visit Note: Visit Date: 06/10/2024 PCP: Garald [name], MD Referred by: Garald [name], MD  Subjective: Chief Complaint  Patient presents with   Lower Back - Pain   HPI: Hailey [name] is a 89 y.o. female who comes in today for evaluation of chronic, worsening and severe right sided lower back pain radiating to right hip and lateral thigh down to knee. Pain ongoing for several years. Her pain worsens with prolonged sitting. Movement and activity seem to help alleviate her pain. She describes her pain as sharp and shooting in nature, currently rates as 8 out of 10. Patient has completed regimen of formal physical therapy with in the past for more balance/gait issues. Recent lumbar MRI imaging shows moderate central stenosis and moderate right foraminal stenosis at L3-L4. A large hemangioma fills the T10 vertebral body with evidence of chronic superior and inferior endplate compression fractures at T10. Additional hemangiomas in the L1 and L2 vertebral bodies. She underwent right L5 transforaminal epidural steroid injection in our office on 04/29/2024, no relief of pain with this procedure. She also underwent right greater trochanter injection in April, no relief of pain with this procedure. Patient denies focal weakness, numbness and tingling. No recent trauma or falls.       Review of Systems  Musculoskeletal:  Positive for back pain.  Neurological:  Negative for tingling, sensory change, focal weakness and weakness.  All other systems reviewed and are negative.  Otherwise per HPI.  Assessment & Plan: Visit Diagnoses:    ICD-10-CM   1. Chronic right-sided low back pain with right-sided sciatica  M54.41    G89.29     2. Lumbar radiculopathy  M54.16     3. Facet arthropathy, lumbar  M47.816     4. Spinal stenosis of lumbar region without neurogenic claudication  M48.061        Plan:  Findings:  Chronic, worsening and severe right sided lower back pain radiating to right hip and lateral thigh down to knee. Patient continues to have severe pain despite good conservative therapies such as formal physical therapy, home exercise regimen, rest and use of medications. I discussed recent lumbar MRI with her today using imaging and spine model. There is now moderate central canal stenosis at L3-L4. This could be a source of pain for her, although her symptoms do not fit with classic neurogenic claudication. Next step is to perform diagnostic and hopefully therapeutic right L3-L4 interlaminar epidural steroid injection. She is currently taking Eliquis , will need permission to discontinue this medication prior to injection. Should her pain persist post injection we would recommend re-grouping with formal physical therapy/referral for chronic pain management. She has no questions at this time. No red flag symptoms noted upon exam today.     Meds & Orders: No orders of the defined types were placed in this encounter.  No orders of the defined types were placed in this encounter.   Follow-up: No follow-ups on file.   Procedures: No procedures performed      Clinical History: EXAM: MRI LUMBAR SPINE 05/29/2024 06:25:46 PM   TECHNIQUE: Multiplanar multisequence MRI of the lumbar spine was performed without the administration of intravenous contrast.   COMPARISON: None available.   CLINICAL HISTORY: Low back pain, symptoms persist > 6 weeks. Chronic right low back pain with right leg pain for several years.   FINDINGS:   BONES AND ALIGNMENT: Dextroconvex  scoliosis centered at L1 with substantial rotary component. Mild levoconvex lower lumbar scoliosis with rotary component. 3 mm degenerative retrolisthesis at L1-2 and L2-3. A large hemangioma fills the T10 vertebral body with evidence of chronic superior and inferior endplate compression fractures at T10. Additional hemangiomas  in the L1 and L2 vertebral bodies. Type 1 degenerative endplate findings at L3-4 eccentric to the right and extending into the right L4 pedicle. Degenerative facet edema on the right at L4-5. Small degenerative cyst like structures to the left of the interspinous space which is narrowed, suggesting Baastrups disease.   SPINAL CORD: The conus terminates normally. Cauda equina unremarkable.   SOFT TISSUES: No paraspinal mass. Bilateral small simple-appearing renal cysts warrant no further imaging workup.   T12-L1: No impingement. Small left perineural cyst.   L1-L2: No impingement. Left foraminal intervertebral and facet spurring.   L2-L3: Moderate central stenosis due to disc bulge and facet arthropathy. Right lateral recess disc protrusion extends caudad, similar to the prior exam.   L3-L4: Moderate central stenosis and moderate right foraminal stenosis due to facet arthropathy, ligamentum flavum redundancy, disc bulge, and intervertebral spurring. Small degenerative cyst like structures to the left of the interspinous space which is narrowed, suggesting Baastrups disease.   L4-L5: Moderate right foraminal stenosis related to facet arthropathy disc bulge, and intervertebral spurring. Synovial cyst posterior to the right facet joint.   L5-S1: Moderate left foraminal stenosis due to intervertebral and facet spurring. Overall degrees of impingement are similar to the prior exam.   IMPRESSION: 1. Lumbar spondylosis, scoliosis, and degenerative disc disease, causing moderate impingement at L2-3, L3-4, L4-5, and L5-S1, similar to the prior exam. 2. Degenerative retrolisthesis at L1-2 and L2-3 (3 mm). 3. Type 1 degenerative endplate changes at L3-4 extending into the right L4 pedicle. 4. 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 5. Vertebral body hemangiomas at T10 (with chronic superior and inferior endplate compression fractures), L1, and L2. 6. Dextroconvex  scoliosis centered at L1 with rotary component and mild levoconvex lower lumbar scoliosis with rotary component.   Electronically signed by: Ryan Salvage MD 06/04/2024 06:06 PM EDT RP Workstation: [id number]   She reports that she has never smoked. She has never used smokeless tobacco. No results for input(s): HGBA1C, LABURIC in the last 8760 hours.  Objective:  VS:  HT:    WT:   BMI:     BP:   HR: bpm  TEMP: ( )  RESP:  Physical Exam Vitals and nursing note reviewed.  HENT:     Head: Normocephalic and atraumatic.     Right Ear: External ear normal.     Left Ear: External ear normal.     Nose: Nose normal.     Mouth/Throat:     Mouth: Mucous membranes are moist.  Eyes:     Extraocular Movements: Extraocular movements intact.  Cardiovascular:     Rate and Rhythm: Normal rate.     Pulses: Normal pulses.  Pulmonary:     Effort: Pulmonary effort is normal.  Abdominal:     General: Abdomen is flat. There is no distension.  Musculoskeletal:        General: Tenderness present.     Cervical back: Normal range of motion.     Comments: Patient rises from seated position to standing without difficulty. Good lumbar range of motion. No pain noted with facet loading. 5/5 strength noted with bilateral hip flexion, knee flexion/extension, ankle dorsiflexion/plantarflexion and EHL. No clonus noted bilaterally. Mild tenderness upon palpation of  bilateral greater trochanter regions. No pain with internal/external rotation of bilateral hips. Sensation intact bilaterally. Dysesthesias noted to right L5 dermatome. Negative slump test bilaterally. Ambulates without aid, gait steady.   Skin:    General: Skin is warm and dry.     Capillary Refill: Capillary refill takes less than 2 seconds.  Neurological:     General: No focal deficit present.     Mental Status: She is alert and oriented to person, place, and time.  Psychiatric:        Mood and Affect: Mood normal.        Behavior:  Behavior normal.     Ortho Exam  Imaging: No results found.  Past Medical/Family/Surgical/Social History: Medications & Allergies reviewed per EMR, new medications updated. Patient Active Problem List   Diagnosis Date Noted   C. difficile colitis 06/01/2024   Clostridioides difficile infection 10/22/2023   Supraclavicular fossa fullness 09/24/2023   COVID-19 09/24/2023   Cough 06/28/2023   Degenerative tear of left medial meniscus 06/20/2023   History of skin cancer 05/20/2023   Melanocytic nevi of trunk 05/20/2023   Seborrheic keratoses 05/20/2023   Squamous cell carcinoma of skin of right lower extremity 05/20/2023   Mitral regurgitation 02/22/2023   Aneurysm of ascending aorta without rupture 02/18/2023   Cellulitis of left leg 02/18/2023   Closed comminuted fracture of left humerus 02/07/2023   Bradycardia 02/07/2023   Fall 02/07/2023   Chronic a-fib (HCC) 02/07/2023   Statin intolerance 12/31/2022   Acute pain of right knee 12/06/2022   Impetigo 10/01/2022   Insomnia 10/01/2022   Osteoporosis 06/11/2022   Weakness 04/25/2022   Weakness generalized 04/25/2022   Falls frequently 04/19/2022   Iron  deficiency anemia 12/08/2021   Skin ulcer, limited to breakdown of skin (HCC) 11/22/2021   Anemia, iron  deficiency 11/22/2021   Low serum vitamin B12 11/22/2021   Acute renal failure superimposed on stage 2 chronic kidney disease 11/20/2021   ABLA (acute blood loss anemia) 11/20/2021   Hypotension 11/19/2021   Shortness of breath 06/12/2021   Right leg pain 02/21/2021   History of Clostridioides difficile colitis    Abnormal CT of the abdomen    Aortic atherosclerosis 08/27/2020   Acute prerenal azotemia 08/27/2020   Shock circulatory (HCC) 08/27/2020   Metabolic acidosis with normal anion gap and bicarbonate losses 08/27/2020   Abdominal cramps 03/16/2020   Neurogenic orthostatic hypotension (HCC) 09/16/2019   Greater trochanteric pain syndrome of right lower  extremity 07/30/2019   Abdominal pain 05/22/2018   Bradycardia with 41-50 beats per minute 12/27/2017   Right bundle branch block (RBBB) on electrocardiogram (ECG) 12/27/2017   Chronic kidney disease, stage 3b (HCC) 12/24/2017   Cellulitis of leg, right with large prepatella hematoma and open wounds 12/24/2017   Iliotibial band syndrome of right side 12/16/2017   Intervertebral lumbar disc disorder with myelopathy, lumbar region 04/25/2017   Family history of colon cancer in mother 01/22/2017   Primary osteoarthritis of both first carpometacarpal joints 01/02/2017   Pain 11/21/2016   Gastroenteritis 09/13/2016   Chronic anticoagulation 05/24/2015   Coronary artery disease involving native coronary artery of native heart with angina pectoris 12/20/2014   Diarrhea 12/22/2013   Permanent atrial fibrillation (HCC) 11/21/2012   Greater trochanteric bursitis of both hips 05/21/2012   Well adult exam 11/07/2011   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   Bruising 12/18/2010   Osteoarthritis of left hip 07/03/2010   DEGENERATIVE DISC DISEASE, LUMBOSACRAL SPINE 05/18/2010   SYNCOPE 10/27/2008  Anxiety 05/21/2008   IBS 05/21/2008   Essential hypertension 06/16/2007   GERD (gastroesophageal reflux disease) 06/16/2007   Past Medical History:  Diagnosis Date   Acute blood loss anemia 12/25/2017   Allergic rhinitis    Anxiety    Arthritis    my whole spine (07/01/2017)   Arthritis    Atrial fibrillation (HCC)    Bowel obstruction (HCC)    in [place]    Bradycardia with 41-50 beats per minute 12/27/2017   Cancer (HCC)    Cellulitis of leg, right with large prepatella hematoma and open wounds 12/26/2017   CKD (chronic kidney disease) stage 2, GFR 60-89 ml/min    Colon polyps    Coronary artery disease    10/18 PCI/DES to p/m LCx with cutting balloon to mLcx   Diverticulosis of colon    GERD (gastroesophageal reflux disease)    Hip bursitis 2010   Dr Jacklin, Post op seroma   History  of colon polyps    HSV (herpes simplex virus) anogenital infection 07/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   HTN (hypertension)    IBS (irritable bowel syndrome)    constipation predominant - Dr Luis   Lichen sclerosus    Osteopenia 11/2016   T score -2.0 FRAX 15%/4.3%   PAC (premature atrial contraction)    Symptomatiic   Renal insufficiency    Right bundle branch block (RBBB) on electrocardiogram (ECG) 12/27/2017   Scoliosis    SVT (supraventricular tachycardia)    brief history   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   biopsy showing vulvar atypia, possible VIN I   Family History  Problem Relation Age of Onset   Colon cancer Mother        Dx age 41, died at age 93   Diabetes Father    Prostate cancer Father    Prostate cancer Brother    Pancreatic cancer Brother    Stomach cancer Son    Past Surgical History:  Procedure Laterality Date   ANTERIOR AND POSTERIOR VAGINAL REPAIR  01/2002   thelbert 01/23/2011   APPENDECTOMY  1948   CARDIAC CATHETERIZATION  06/26/2017   CORONARY ANGIOPLASTY WITH STENT PLACEMENT  07/01/2017   CORONARY ATHERECTOMY N/A 07/01/2017   Procedure: CORONARY ATHERECTOMY;  Surgeon: Claudene Victory ORN, MD;  Location: [facility] INVASIVE CV LAB;  Service: Cardiovascular;  Laterality: N/A;   CORONARY STENT INTERVENTION N/A 07/01/2017   Procedure: CORONARY STENT INTERVENTION;  Surgeon: Claudene Victory ORN, MD;  Location: [facility];  Service: Cardiovascular;  Laterality: N/A;   HAMMER TOE SURGERY     HEMORRHOID BANDING     HIP SURGERY Left 04/2009   hip examination under anesthesia followed by greater trochanteric bursectomy; iliotibial band tenotomy/notes 01/20/2011   I & D EXTREMITY Right 01/10/2018   Procedure: IRRIGATION AND DEBRIDEMENT RIGHT KNEE, APPLY WOUND VAC;  Surgeon: Harden Jerona GAILS, MD;  Location: [facility] OR;  Service: Orthopedics;  Laterality: Right;   KNEE BURSECTOMY Right 04/2009   thelbert 01/09/2011   LEFT ATRIAL APPENDAGE OCCLUSION N/A  08/24/2021   Procedure: LEFT ATRIAL APPENDAGE OCCLUSION;  Surgeon: Wonda Sharper, MD;  Location: [facility] INVASIVE CV LAB;  Service: Cardiovascular;  Laterality: N/A;   LEFT HEART CATH AND CORONARY ANGIOGRAPHY N/A 06/26/2017   Procedure: LEFT HEART CATH AND CORONARY ANGIOGRAPHY;  Surgeon: Claudene Victory ORN, MD;  Location: [facility];  Service: Cardiovascular;  Laterality: N/A;   PUBOVAGINAL SLING  01/2002   thelbert 01/23/2011   REDUCTION  MAMMAPLASTY     TEE WITHOUT CARDIOVERSION N/A 08/24/2021   Procedure: TRANSESOPHAGEAL ECHOCARDIOGRAM (TEE);  Surgeon: Wonda Sharper, MD;  Location: [facility] INVASIVE CV LAB;  Service: Cardiovascular;  Laterality: N/A;   TEMPORARY PACEMAKER N/A 07/01/2017   Procedure: TEMPORARY PACEMAKER;  Surgeon: Claudene Victory ORN, MD;  Location: [facility] INVASIVE CV LAB;  Service: Cardiovascular;  Laterality: N/A;   VAGINAL HYSTERECTOMY  01/2002   Vaginal hysterectomy, bilateral salpingo-oophorectomy/notes 01/23/2011   Social History   Occupational History   Occupation: Engineer, manufacturing systems: RETIRED  Tobacco Use   Smoking status: Never   Smokeless tobacco: Never  Vaping Use   Vaping status: Never Used  Substance and Sexual Activity   Alcohol use: Yes    Comment: moderatley [place] before dinner   Drug use: Never   Sexual activity: Not Currently    Birth control/protection: Surgical    Comment: hysterectomy; less than 5, IC after 16, no STD, no abnormal paps

## 2024-06-12 ENCOUNTER — Encounter: Payer: Self-pay | Admitting: Physical Medicine and Rehabilitation

## 2024-06-12 ENCOUNTER — Telehealth: Payer: Self-pay | Admitting: Physical Medicine and Rehabilitation

## 2024-06-12 NOTE — Telephone Encounter (Signed)
 Pt called asking to speak to PA [facility]. Pt did not disclose what is needed. Please call pt at [phone].

## 2024-06-15 ENCOUNTER — Telehealth: Payer: Self-pay | Admitting: Physical Medicine and Rehabilitation

## 2024-06-15 NOTE — Telephone Encounter (Signed)
 Patient with diagnosis of afib on Eliquis  for anticoagulation.    Procedure: ESI (EPIDURAL STEROID INJECTION)  Date of procedure: TBD   CHA2DS2-VASc Score = 6   This indicates a 9.7% annual risk of stroke. The patient's score is based upon: CHF History: 1 HTN History: 1 Diabetes History: 0 Stroke History: 0 Vascular Disease History: 1 Age Score: 2 Gender Score: 1      CrCl 42 ml/min Platelet count 284  Patient has not had an Afib/aflutter ablation in the last 3 months, DCCV within the last 4 weeks or a watchman implanted in the last 45 days   Per office protocol, patient can hold Eliquis  for 3 days prior to procedure.    **This guidance is not considered finalized until pre-operative APP has relayed final recommendations.**

## 2024-06-15 NOTE — Telephone Encounter (Signed)
 Patient called and said she is waiting on approval for an procedure that is supposed to happen and she haven't heard anything. CB#[phone]

## 2024-06-17 ENCOUNTER — Encounter: Payer: Self-pay | Admitting: Physical Medicine and Rehabilitation

## 2024-06-17 NOTE — Telephone Encounter (Signed)
   Patient Name: Hailey [name]  DOB: 1935/08/08 MRN: [id number]  Primary Cardiologist: Arun K Thukkani, MD  Chart reviewed as part of pre-operative protocol coverage. Given past medical history and time since last visit, based on ACC/AHA guidelines, [name] is at acceptable risk for the planned procedure without further cardiovascular testing.   Per office protocol, patient can hold Eliquis  for 3 days prior to procedure.    The patient was advised that if she develops new symptoms prior to surgery to contact our office to arrange for a follow-up visit, and she verbalized understanding.  I will route this recommendation to the requesting party via Epic fax function and remove from pre-op pool.  Please call with questions.  Lamarr Satterfield, NP 06/17/2024, 2:11 PM

## 2024-06-19 ENCOUNTER — Telehealth: Payer: Self-pay

## 2024-06-19 NOTE — Telephone Encounter (Signed)
 Patient called and states it is not an emergency but that the creams you gave her to use on the sore on her vulva do not seem to be helping. She has noticed a little bit of blood there also. Patient is asking if you can send in something else for her to try. Please advise if you would like her to try something else or if you would like her to schedule an appt to be seen. Patient is aware Dr Nikki is out of the office until Monday. She states this is fine because it is not urgent.

## 2024-06-19 NOTE — Telephone Encounter (Signed)
 It is possible that her vulvar irritation is due to a herpes outbreak.   She has a prescription for Valtrex .   I recommend she take on tablet my mouth daily for 3 days.  Office visit if symptoms do not improve.

## 2024-06-21 ENCOUNTER — Encounter: Payer: Self-pay | Admitting: Gastroenterology

## 2024-06-22 ENCOUNTER — Encounter: Payer: Self-pay | Admitting: Physical Medicine and Rehabilitation

## 2024-06-22 NOTE — Telephone Encounter (Signed)
 Spoke with patient. Advised per Dr. Nikki. Patient states she just completed 2 weeks of abx for cdiff. Reports external vaginal itching, made worse by creams she was using. Reports skin is irritated. Denies vaginal d/c, bleeding, odor or lesions. Declines OV at this time, will try valtrex  first, if symptoms do not improve or resolve, patient will return call for OV.   Routing to provider for final review. Patient is agreeable to disposition. Will close encounter.

## 2024-06-25 ENCOUNTER — Ambulatory Visit: Admitting: Physical Medicine and Rehabilitation

## 2024-06-25 ENCOUNTER — Other Ambulatory Visit: Payer: Self-pay

## 2024-06-25 VITALS — BP 145/100 | HR 90

## 2024-06-25 DIAGNOSIS — M5416 Radiculopathy, lumbar region: Secondary | ICD-10-CM | POA: Diagnosis not present

## 2024-06-25 MED ORDER — METHYLPREDNISOLONE ACETATE 40 MG/ML IJ SUSP
40.0000 mg | Freq: Once | INTRAMUSCULAR | Status: AC
  Administered 2024-06-25: 40 mg

## 2024-06-25 NOTE — Progress Notes (Signed)
 Pain Scale   Average Pain 6 Patient advising she has lower back pain that is constant and she also advises she is very nervous and her B/P my be elevated.        +Driver, -BT, -Dye Allergies.

## 2024-06-25 NOTE — Procedures (Signed)
 Lumbar Epidural Steroid Injection - Interlaminar Approach with Fluoroscopic Guidance  Patient: Hailey [name]      Date of Birth: 05/08/1935 MRN: [id number] PCP: Garald [name], MD      Visit Date: 06/25/2024   Universal Protocol:     Consent Given By: the patient  Position: PRONE  Additional Comments: Vital signs were monitored before and after the procedure. Patient was prepped and draped in the usual sterile fashion. The correct patient, procedure, and site was verified.   Injection Procedure Details:   Procedure diagnoses: Lumbar radiculopathy [M54.16]   Meds Administered:  Meds ordered this encounter  Medications   methylPREDNISolone  acetate (DEPO-MEDROL ) injection 40 mg     Laterality: Right  Location/Site:  L3-4  Needle: 3.5 in., 20 ga. Tuohy  Needle Placement: Paramedian epidural  Findings:   -Comments: Excellent flow of contrast into the epidural space.  Procedure Details: Using a paramedian approach from the side mentioned above, the region overlying the inferior lamina was localized under fluoroscopic visualization and the soft tissues overlying this structure were infiltrated with 4 ml. of 1% Lidocaine  without Epinephrine . The Tuohy needle was inserted into the epidural space using a paramedian approach.   The epidural space was localized using loss of resistance along with counter oblique bi-planar fluoroscopic views.  After negative aspirate for air, blood, and CSF, a 2 ml. volume of Isovue -250 was injected into the epidural space and the flow of contrast was observed. Radiographs were obtained for documentation purposes.    The injectate was administered into the level noted above.   Additional Comments:  The patient tolerated the procedure well Dressing: 2 x 2 sterile gauze and Band-Aid    Post-procedure details: Patient was observed during the procedure. Post-procedure instructions were reviewed.  Patient left the clinic in stable  condition.

## 2024-06-25 NOTE — Progress Notes (Signed)
 Hailey [name] - 89 y.o. female MRN [id number]  Date of birth: Mar 28, 1935  Office Visit Note: Visit Date: 06/25/2024 PCP: Garald [name], MD Referred by: Garald [name], MD  Subjective: Chief Complaint  Patient presents with   Lower Back - Pain   HPI:  Hailey [name] is a 89 y.o. female who comes in today at the request of Duwaine Pouch, FNP for planned Right L3-4 Lumbar Interlaminar epidural steroid injection with fluoroscopic guidance.  The patient has failed conservative care including home exercise, medications, time and activity modification.  This injection will be diagnostic and hopefully therapeutic.  Please see requesting physician notes for further details and justification.   ROS Otherwise per HPI.  Assessment & Plan: Visit Diagnoses:    ICD-10-CM   1. Lumbar radiculopathy  M54.16 XR C-ARM NO REPORT    Epidural Steroid injection    methylPREDNISolone  acetate (DEPO-MEDROL ) injection 40 mg      Plan: No additional findings.   Meds & Orders:  Meds ordered this encounter  Medications   methylPREDNISolone  acetate (DEPO-MEDROL ) injection 40 mg    Orders Placed This Encounter  Procedures   XR C-ARM NO REPORT   Epidural Steroid injection    Follow-up: Return for visit to requesting provider as needed.   Procedures: No procedures performed  Lumbar Epidural Steroid Injection - Interlaminar Approach with Fluoroscopic Guidance  Patient: Hailey [name]      Date of Birth: [birth year] MRN: [id number] PCP: Garald [name], MD      Visit Date: 06/25/2024   Universal Protocol:     Consent Given By: the patient  Position: PRONE  Additional Comments: Vital signs were monitored before and after the procedure. Patient was prepped and draped in the usual sterile fashion. The correct patient, procedure, and site was verified.   Injection Procedure Details:   Procedure diagnoses: Lumbar radiculopathy [M54.16]   Meds Administered:  Meds ordered  this encounter  Medications   methylPREDNISolone  acetate (DEPO-MEDROL ) injection 40 mg     Laterality: Right  Location/Site:  L3-4  Needle: 3.5 in., 20 ga. Tuohy  Needle Placement: Paramedian epidural  Findings:   -Comments: Excellent flow of contrast into the epidural space.  Procedure Details: Using a paramedian approach from the side mentioned above, the region overlying the inferior lamina was localized under fluoroscopic visualization and the soft tissues overlying this structure were infiltrated with 4 ml. of 1% Lidocaine  without Epinephrine . The Tuohy needle was inserted into the epidural space using a paramedian approach.   The epidural space was localized using loss of resistance along with counter oblique bi-planar fluoroscopic views.  After negative aspirate for air, blood, and CSF, a 2 ml. volume of Isovue -250 was injected into the epidural space and the flow of contrast was observed. Radiographs were obtained for documentation purposes.    The injectate was administered into the level noted above.   Additional Comments:  The patient tolerated the procedure well Dressing: 2 x 2 sterile gauze and Band-Aid    Post-procedure details: Patient was observed during the procedure. Post-procedure instructions were reviewed.  Patient left the clinic in stable condition.   Clinical History: EXAM: MRI LUMBAR SPINE 05/29/2024 06:25:46 PM   TECHNIQUE: Multiplanar multisequence MRI of the lumbar spine was performed without the administration of intravenous contrast.   COMPARISON: None available.   CLINICAL HISTORY: Low back pain, symptoms persist > 6 weeks. Chronic right low back pain with right leg pain for several years.   FINDINGS:  BONES AND ALIGNMENT: Dextroconvex scoliosis centered at L1 with substantial rotary component. Mild levoconvex lower lumbar scoliosis with rotary component. 3 mm degenerative retrolisthesis at L1-2 and L2-3. A large hemangioma fills  the T10 vertebral body with evidence of chronic superior and inferior endplate compression fractures at T10. Additional hemangiomas in the L1 and L2 vertebral bodies. Type 1 degenerative endplate findings at L3-4 eccentric to the right and extending into the right L4 pedicle. Degenerative facet edema on the right at L4-5. Small degenerative cyst like structures to the left of the interspinous space which is narrowed, suggesting Baastrups disease.   SPINAL CORD: The conus terminates normally. Cauda equina unremarkable.   SOFT TISSUES: No paraspinal mass. Bilateral small simple-appearing renal cysts warrant no further imaging workup.   T12-L1: No impingement. Small left perineural cyst.   L1-L2: No impingement. Left foraminal intervertebral and facet spurring.   L2-L3: Moderate central stenosis due to disc bulge and facet arthropathy. Right lateral recess disc protrusion extends caudad, similar to the prior exam.   L3-L4: Moderate central stenosis and moderate right foraminal stenosis due to facet arthropathy, ligamentum flavum redundancy, disc bulge, and intervertebral spurring. Small degenerative cyst like structures to the left of the interspinous space which is narrowed, suggesting Baastrups disease.   L4-L5: Moderate right foraminal stenosis related to facet arthropathy disc bulge, and intervertebral spurring. Synovial cyst posterior to the right facet joint.   L5-S1: Moderate left foraminal stenosis due to intervertebral and facet spurring. Overall degrees of impingement are similar to the prior exam.   IMPRESSION: 1. Lumbar spondylosis, scoliosis, and degenerative disc disease, causing moderate impingement at L2-3, L3-4, L4-5, and L5-S1, similar to the prior exam. 2. Degenerative retrolisthesis at L1-2 and L2-3 (3 mm). 3. Type 1 degenerative endplate changes at L3-4 extending into the right L4 pedicle. 4. 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 5.  Vertebral body hemangiomas at T10 (with chronic superior and inferior endplate compression fractures), L1, and L2. 6. Dextroconvex scoliosis centered at L1 with rotary component and mild levoconvex lower lumbar scoliosis with rotary component.   Electronically signed by: Ryan Salvage MD 06/04/2024 06:06 PM EDT RP Workstation: [id number]     Objective:  VS:  HT:    WT:   BMI:     BP:(!) 145/100  HR:90bpm  TEMP: ( )  RESP:  Physical Exam Vitals and nursing note reviewed.  Constitutional:      General: She is not in acute distress.    Appearance: Normal appearance. She is not ill-appearing.  HENT:     Head: Normocephalic and atraumatic.     Right Ear: External ear normal.     Left Ear: External ear normal.  Eyes:     Extraocular Movements: Extraocular movements intact.  Cardiovascular:     Rate and Rhythm: Normal rate.     Pulses: Normal pulses.  Pulmonary:     Effort: Pulmonary effort is normal. No respiratory distress.  Abdominal:     General: There is no distension.     Palpations: Abdomen is soft.  Musculoskeletal:        General: Tenderness present.     Cervical back: Neck supple.     Right lower leg: No edema.     Left lower leg: No edema.     Comments: Patient has good distal strength with no pain over the greater trochanters.  No clonus or focal weakness.  Skin:    Findings: No erythema, lesion or rash.  Neurological:  General: No focal deficit present.     Mental Status: She is alert and oriented to person, place, and time.     Sensory: No sensory deficit.     Motor: No weakness or abnormal muscle tone.     Coordination: Coordination normal.  Psychiatric:        Mood and Affect: Mood normal.        Behavior: Behavior normal.      Imaging: XR C-ARM NO REPORT Result Date: 06/25/2024 Please see Notes tab for imaging impression.

## 2024-06-26 ENCOUNTER — Other Ambulatory Visit: Payer: Self-pay | Admitting: Internal Medicine

## 2024-06-26 DIAGNOSIS — I4821 Permanent atrial fibrillation: Secondary | ICD-10-CM

## 2024-06-29 ENCOUNTER — Other Ambulatory Visit: Payer: Self-pay | Admitting: Internal Medicine

## 2024-06-30 MED ORDER — APIXABAN 2.5 MG PO TABS: 2.5000 mg | ORAL_TABLET | Freq: Two times a day (BID) | ORAL | 1 refills | Status: DC

## 2024-06-30 NOTE — Telephone Encounter (Signed)
 Eliquis  2.5mg  refill request received. Patient is 89 years old, weight-64.5kg, Crea-0.96 on 04/21/24, Diagnosis-Afib, and last seen by Dr. Wendel on 05/19/24. Per 05/19/24 OV note it states: Permanent atrial fibrillation: Continue Eliquis  2.5 mg twice daily and Secondary hypercoagulable state: Continue Eliquis  2.5 mg twice daily   Will send in refill to requested pharmacy.

## 2024-07-01 ENCOUNTER — Encounter: Payer: Self-pay | Admitting: Physical Medicine and Rehabilitation

## 2024-07-06 ENCOUNTER — Other Ambulatory Visit: Payer: Self-pay | Admitting: Cardiology

## 2024-07-06 DIAGNOSIS — I4821 Permanent atrial fibrillation: Secondary | ICD-10-CM

## 2024-07-06 NOTE — Telephone Encounter (Signed)
 Prescription refill request for Eliquis  received. Indication: AF Last office visit: 9/25 Thukkani Scr:  0.96 Age: 89 Weight:  64.5 kg  Chart indicates dose decreased 11/2018 secondary to increased bruising/hematoma.  Patient was notified of increased risk due to lower dose.

## 2024-07-09 ENCOUNTER — Encounter: Payer: Self-pay | Admitting: Internal Medicine

## 2024-07-09 ENCOUNTER — Encounter: Admitting: Physical Medicine and Rehabilitation

## 2024-07-13 ENCOUNTER — Encounter: Payer: Self-pay | Admitting: Physical Medicine and Rehabilitation

## 2024-07-13 ENCOUNTER — Other Ambulatory Visit (HOSPITAL_COMMUNITY)
Admission: RE | Admit: 2024-07-13 | Discharge: 2024-07-13 | Disposition: A | Discharge status: Home / Self Care | Source: Ambulatory Visit | Attending: Obstetrics and Gynecology | Admitting: Obstetrics and Gynecology

## 2024-07-13 ENCOUNTER — Ambulatory Visit: Admitting: Physical Medicine and Rehabilitation

## 2024-07-13 ENCOUNTER — Encounter: Payer: Self-pay | Admitting: Radiology

## 2024-07-13 ENCOUNTER — Ambulatory Visit (INDEPENDENT_AMBULATORY_CARE_PROVIDER_SITE_OTHER): Admitting: Obstetrics and Gynecology

## 2024-07-13 ENCOUNTER — Encounter: Payer: Self-pay | Admitting: Obstetrics and Gynecology

## 2024-07-13 VITALS — BP 118/74 | HR 70

## 2024-07-13 DIAGNOSIS — M5441 Lumbago with sciatica, right side: Secondary | ICD-10-CM | POA: Diagnosis not present

## 2024-07-13 DIAGNOSIS — Z87412 Personal history of vulvar dysplasia: Secondary | ICD-10-CM

## 2024-07-13 DIAGNOSIS — N760 Acute vaginitis: Secondary | ICD-10-CM

## 2024-07-13 DIAGNOSIS — N949 Unspecified condition associated with female genital organs and menstrual cycle: Secondary | ICD-10-CM

## 2024-07-13 DIAGNOSIS — M5416 Radiculopathy, lumbar region: Secondary | ICD-10-CM

## 2024-07-13 DIAGNOSIS — G8929 Other chronic pain: Secondary | ICD-10-CM

## 2024-07-13 DIAGNOSIS — M48061 Spinal stenosis, lumbar region without neurogenic claudication: Secondary | ICD-10-CM

## 2024-07-13 DIAGNOSIS — L900 Lichen sclerosus et atrophicus: Secondary | ICD-10-CM

## 2024-07-13 MED ORDER — VALACYCLOVIR HCL 500 MG PO TABS: 500.0000 mg | ORAL_TABLET | Freq: Every day | ORAL | 0 refills | Status: DC

## 2024-07-13 NOTE — Progress Notes (Signed)
 Hailey [name] - 89 y.o. female MRN [id number]  Date of birth: 06/16/1935  Office Visit Note: Visit Date: 07/13/2024 PCP: Garald [name], MD Referred by: Garald [name], MD  Subjective: Chief Complaint  Patient presents with   Lower Back - Pain   HPI: Hailey [name] is a 89 y.o. female who comes in today for evaluation of chronic, worsening and severe right sided lower back pain radiating to right hip and lateral thigh down to knee. Pain ongoing for several years, worsens with activity and prolonged sitting. She describes pain as sharp in nature, currently rates as 8 out of 10. Some relief of pain with home exercise regimen, rest and use of medications. Good relief of pain with chiropractic treatments. Recent lumbar MRI imaging shows moderate central stenosis and moderate right foraminal stenosis at L3-L4. A large hemangioma fills the T10 vertebral body with evidence of chronic superior and inferior endplate compression fractures at T10. Additional hemangiomas in the L1 and L2 vertebral bodies. She recently underwent right L3-L4 interlaminar epidural steroid injection in our office on 06/15/2024. No relief of pain with this procedure. She has also undergone right greater trochanter injection and multiple transforaminal injections in our office with minimal relief of pain. Patient denies focal weakness, numbness and tingling. No recent trauma or falls.       Review of Systems  Musculoskeletal:  Positive for back pain.  Neurological:  Negative for tingling, sensory change, focal weakness and weakness.  All other systems reviewed and are negative.  Otherwise per HPI.  Assessment & Plan: Visit Diagnoses:    ICD-10-CM   1. Chronic bilateral low back pain with right-sided sciatica  G89.29 AMB referral to sports medicine   M54.41     2. Lumbar radiculopathy  M54.16 AMB referral to sports medicine    3. Spinal stenosis of lumbar region without neurogenic claudication  M48.061  AMB referral to sports medicine       Plan: Findings:  Chronic, worsening and severe right sided lower back pain radiating to right hip and lateral thigh down to knee. Patient continues to have severe pain despite good conservative therapies such as chiropractic treatments, home exercise regimen, rest and use of medications. There is moderate central canal stenosis at L3-L4. This could be a source of pain for her, although her symptoms do not fit with classic neurogenic claudication. She denies groin pain bilaterally. We have tried multiple injections including interlaminar and transforaminal epidural steroid injections and right greater trochanter injection with minimal relief of pain. She is not interested in continuing with injection therapy at this time. She mentioned referral to doctor of osteopathic medicine during our visit today. We discussed referral to Dr. Ozell Jewell for evaluation. She can continue with ongoing chiropractic treatments. I also discussed chronic pain management and possible referral to spine surgeon. She would like to avoid medications if possible. Also feels she does not want to undergo surgery. I will go ahead and place referral to Dr. Jewell today. We are happy to see her back as needed. No red flag symptoms noted upon exam today.     Meds & Orders: No orders of the defined types were placed in this encounter.   Orders Placed This Encounter  Procedures   AMB referral to sports medicine    Follow-up: Return if symptoms worsen or fail to improve.   Procedures: No procedures performed      Clinical History: EXAM: MRI LUMBAR SPINE 05/29/2024 06:25:46 PM   TECHNIQUE:  Multiplanar multisequence MRI of the lumbar spine was performed without the administration of intravenous contrast.   COMPARISON: None available.   CLINICAL HISTORY: Low back pain, symptoms persist > 6 weeks. Chronic right low back pain with right leg pain for several years.   FINDINGS:    BONES AND ALIGNMENT: Dextroconvex scoliosis centered at L1 with substantial rotary component. Mild levoconvex lower lumbar scoliosis with rotary component. 3 mm degenerative retrolisthesis at L1-2 and L2-3. A large hemangioma fills the T10 vertebral body with evidence of chronic superior and inferior endplate compression fractures at T10. Additional hemangiomas in the L1 and L2 vertebral bodies. Type 1 degenerative endplate findings at L3-4 eccentric to the right and extending into the right L4 pedicle. Degenerative facet edema on the right at L4-5. Small degenerative cyst like structures to the left of the interspinous space which is narrowed, suggesting Baastrups disease.   SPINAL CORD: The conus terminates normally. Cauda equina unremarkable.   SOFT TISSUES: No paraspinal mass. Bilateral small simple-appearing renal cysts warrant no further imaging workup.   T12-L1: No impingement. Small left perineural cyst.   L1-L2: No impingement. Left foraminal intervertebral and facet spurring.   L2-L3: Moderate central stenosis due to disc bulge and facet arthropathy. Right lateral recess disc protrusion extends caudad, similar to the prior exam.   L3-L4: Moderate central stenosis and moderate right foraminal stenosis due to facet arthropathy, ligamentum flavum redundancy, disc bulge, and intervertebral spurring. Small degenerative cyst like structures to the left of the interspinous space which is narrowed, suggesting Baastrups disease.   L4-L5: Moderate right foraminal stenosis related to facet arthropathy disc bulge, and intervertebral spurring. Synovial cyst posterior to the right facet joint.   L5-S1: Moderate left foraminal stenosis due to intervertebral and facet spurring. Overall degrees of impingement are similar to the prior exam.   IMPRESSION: 1. Lumbar spondylosis, scoliosis, and degenerative disc disease, causing moderate impingement at L2-3, L3-4, L4-5, and L5-S1,  similar to the prior exam. 2. Degenerative retrolisthesis at L1-2 and L2-3 (3 mm). 3. Type 1 degenerative endplate changes at L3-4 extending into the right L4 pedicle. 4. 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 5. Vertebral body hemangiomas at T10 (with chronic superior and inferior endplate compression fractures), L1, and L2. 6. Dextroconvex scoliosis centered at L1 with rotary component and mild levoconvex lower lumbar scoliosis with rotary component.   Electronically signed by: Ryan Salvage MD 06/04/2024 06:06 PM EDT RP Workstation: [id number]   She reports that she has quit smoking. Her smoking use included cigarettes. She has never used smokeless tobacco.  Recent Labs    07/19/24 0740  HGBA1C 5.1    Objective:  VS:  HT:    WT:   BMI:     BP:   HR: bpm  TEMP: ( )  RESP:  Physical Exam Vitals and nursing note reviewed.  HENT:     Head: Normocephalic and atraumatic.     Right Ear: External ear normal.     Left Ear: External ear normal.     Nose: Nose normal.     Mouth/Throat:     Mouth: Mucous membranes are moist.  Eyes:     Extraocular Movements: Extraocular movements intact.  Cardiovascular:     Rate and Rhythm: Normal rate.     Pulses: Normal pulses.  Pulmonary:     Effort: Pulmonary effort is normal.  Abdominal:     General: Abdomen is flat. There is no distension.  Musculoskeletal:  General: Tenderness present.     Cervical back: Normal range of motion.     Comments: Patient rises from seated position to standing without difficulty. Good lumbar range of motion. No pain noted with facet loading. 5/5 strength noted with bilateral hip flexion, knee flexion/extension, ankle dorsiflexion/plantarflexion and EHL. No clonus noted bilaterally. Mild tenderness upon palpation of bilateral greater trochanter regions. No pain with internal/external rotation of bilateral hips. Sensation intact bilaterally. Dysesthesias noted to right L5 dermatome.  Negative slump test bilaterally. Ambulates without aid, gait steady.    Skin:    General: Skin is warm and dry.     Capillary Refill: Capillary refill takes less than 2 seconds.  Neurological:     General: No focal deficit present.     Mental Status: She is alert and oriented to person, place, and time.  Psychiatric:        Mood and Affect: Mood normal.        Behavior: Behavior normal.     Ortho Exam  Imaging: CT ABDOMEN PELVIS W CONTRAST Result Date: 07/19/2024 EXAM: CT ABDOMEN AND PELVIS WITH CONTRAST 07/19/2024 10:36:15 AM TECHNIQUE: CT of the abdomen and pelvis was performed with the administration of 80 mL of iohexol  (OMNIPAQUE ) 300 MG/ML solution. Multiplanar reformatted images are provided for review. Automated exposure control, iterative reconstruction, and/or weight-based adjustment of the mA/kV was utilized to reduce the radiation dose to as low as reasonably achievable. COMPARISON: CT abdomen and pelvis 04/21/2024. CLINICAL HISTORY: 89 year old female with LLQ abdominal pain. FINDINGS: LOWER CHEST: Stable mild cardiomegaly. No pericardial or pleural effusion. Small chronic Bochdalek hernia on the right is stable. Superimposed bilateral lung bases atelectasis and scarring is stable. LIVER: The liver is unremarkable. GALLBLADDER AND BILE DUCTS: Gallbladder appears more distended compared to 04/21/2024 but otherwise within normal limits. No biliary ductal dilatation. SPLEEN: No acute abnormality. PANCREAS: Pancreatic atrophy. ADRENAL GLANDS: No acute abnormality. KIDNEYS, URETERS AND BLADDER: Exophytic simple fluid density left renal upper pole cyst. Per consensus, no follow-up is needed for simple Bosniak type 1 and 2 renal cysts, unless the patient has a malignancy history or risk factors. Bilateral renal enhancement and contrast excretion within normal limits. Diminutive bladder. No stones in the kidneys or ureters. No hydronephrosis. No perinephric or periureteral stranding. GI AND  BOWEL: Large bowel appears indistinct, mildly thickened and inflamed from the splenic flexure, throughout the descending colon, throughout the sigmoid colon and to the rectum. Pronounced sigmoid wall thickening up to 14 mm on series 2 image 52. Relatively mild associated mesenteric inflammatory stranding throughout those segments. Fairly widespread diverticulosis throughout those segments. Upstream transverse colon has a more normal appearance, is redundant. No right colon inflammation identified. Nondilated small bowel. Decompressed stomach and duodenum. Some right colon diverticulosis. Diminutive or absent appendix is not identified. There is no bowel obstruction. PERITONEUM AND RETROPERITONEUM: No ascites. No free air. VASCULATURE: Aorta is normal in caliber. Chronic tortuosity of the visible aorta. Moderately advanced calcified aortoiliac atherosclerosis. Major arterial structures and the portal venous system appear patent. LYMPH NODES: No lymphadenopathy. REPRODUCTIVE ORGANS: No acute abnormality. BONES AND SOFT TISSUES: Osteopenia. Advanced lumbar spine degeneration superimposed on moderate thoracolumbar scoliosis. Chronic T10 compression fracture partially visible. No acute osseous abnormality. No focal soft tissue abnormality. IMPRESSION: 1. Widespread large bowel inflammation compatible with Acute colitis from the splenic flexure to the rectum. Pronounced sigmoid wall thickening (up to 14 mm). No obstruction, abscess, or other complicating features. 2. Advanced aortoiliac atherosclerosis and spine degeneration. Stable mild cardiomegaly.  Electronically signed by: Helayne Hurst MD 07/19/2024 10:59 AM EST RP Workstation: [id number]   DG Chest 2 View Result Date: 07/19/2024 EXAM: 2 VIEW(S) XRAY OF THE CHEST 07/19/2024 09:22:00 AM COMPARISON: 02/07/2023 CLINICAL HISTORY: SOB FINDINGS: LUNGS AND PLEURA: No focal pulmonary opacity. No pulmonary edema. No pleural effusion. No pneumothorax. HEART AND MEDIASTINUM:  Calcified aorta. BONES AND SOFT TISSUES: Thoracic degenerative changes. Old left humeral neck fracture. IMPRESSION: 1. No acute cardiopulmonary process. Electronically signed by: Waddell Calk MD 07/19/2024 09:40 AM EST RP Workstation: [id number]    Past Medical/Family/Surgical/Social History: Medications & Allergies reviewed per EMR, new medications updated. Patient Active Problem List   Diagnosis Date Noted   C. difficile colitis 06/01/2024   Clostridioides difficile infection 10/22/2023   Supraclavicular fossa fullness 09/24/2023   COVID-19 09/24/2023   Cough 06/28/2023   Degenerative tear of left medial meniscus 06/20/2023   History of skin cancer 05/20/2023   Melanocytic nevi of trunk 05/20/2023   Seborrheic keratoses 05/20/2023   Squamous cell carcinoma of skin of right lower extremity 05/20/2023   Mitral regurgitation 02/22/2023   Aneurysm of ascending aorta without rupture 02/18/2023   Cellulitis of left leg 02/18/2023   Closed comminuted fracture of left humerus 02/07/2023   Bradycardia 02/07/2023   Fall 02/07/2023   Chronic a-fib (HCC) 02/07/2023   Statin intolerance 12/31/2022   Acute pain of right knee 12/06/2022   Impetigo 10/01/2022   Insomnia 10/01/2022   Osteoporosis 06/11/2022   Weakness 04/25/2022   Weakness generalized 04/25/2022   Falls frequently 04/19/2022   Iron  deficiency anemia 12/08/2021   Skin ulcer, limited to breakdown of skin (HCC) 11/22/2021   Anemia, iron  deficiency 11/22/2021   Low serum vitamin B12 11/22/2021   Acute renal failure superimposed on stage 2 chronic kidney disease 11/20/2021   ABLA (acute blood loss anemia) 11/20/2021   Hypotension 11/19/2021   Shortness of breath 06/12/2021   Right leg pain 02/21/2021   History of Clostridioides difficile colitis    Abnormal CT of the abdomen    Aortic atherosclerosis 08/27/2020   Acute prerenal azotemia 08/27/2020   Shock circulatory (HCC) 08/27/2020   Metabolic acidosis with normal anion  gap and bicarbonate losses 08/27/2020   Abdominal cramps 03/16/2020   Neurogenic orthostatic hypotension (HCC) 09/16/2019   Greater trochanteric pain syndrome of right lower extremity 07/30/2019   Abdominal pain 05/22/2018   Bradycardia with 41-50 beats per minute 12/27/2017   Right bundle branch block (RBBB) on electrocardiogram (ECG) 12/27/2017   Chronic kidney disease, stage 3b (HCC) 12/24/2017   Cellulitis of leg, right with large prepatella hematoma and open wounds 12/24/2017   Iliotibial band syndrome of right side 12/16/2017   Intervertebral lumbar disc disorder with myelopathy, lumbar region 04/25/2017   Family history of colon cancer in mother 01/22/2017   Primary osteoarthritis of both first carpometacarpal joints 01/02/2017   Pain 11/21/2016   Colitis 09/13/2016   Chronic anticoagulation 05/24/2015   Coronary artery disease involving native coronary artery of native heart with angina pectoris 12/20/2014   Diarrhea 12/22/2013   Permanent atrial fibrillation (HCC) 11/21/2012   Greater trochanteric bursitis of both hips 05/21/2012   Well adult exam 11/07/2011   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   Bruising 12/18/2010   Osteoarthritis of left hip 07/03/2010   DEGENERATIVE DISC DISEASE, LUMBOSACRAL SPINE 05/18/2010   SYNCOPE 10/27/2008   Anxiety 05/21/2008   IBS 05/21/2008   Essential hypertension 06/16/2007   GERD (gastroesophageal reflux disease) 06/16/2007   Past Medical History:  Diagnosis Date  Acute blood loss anemia 12/25/2017   Allergic rhinitis    Anxiety    Arthritis    my whole spine (07/01/2017)   Arthritis    Atrial fibrillation (HCC)    Bowel obstruction (HCC)    in [place]    Bradycardia with 41-50 beats per minute 12/27/2017   Cancer (HCC)    Cellulitis of leg, right with large prepatella hematoma and open wounds 12/26/2017   CKD (chronic kidney disease) stage 2, GFR 60-89 ml/min    Colon polyps    Coronary artery disease    10/18 PCI/DES to  p/m LCx with cutting balloon to mLcx   Diverticulosis of colon    GERD (gastroesophageal reflux disease)    Hip bursitis 2010   Dr Jacklin, Post op seroma   History of colon polyps    HSV (herpes simplex virus) anogenital infection 07/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   HTN (hypertension)    IBS (irritable bowel syndrome)    constipation predominant - Dr Luis   Lichen sclerosus    Osteopenia 11/2016   T score -2.0 FRAX 15%/4.3%   PAC (premature atrial contraction)    Symptomatiic   Renal insufficiency    Right bundle branch block (RBBB) on electrocardiogram (ECG) 12/27/2017   Scoliosis    SVT (supraventricular tachycardia)    brief history   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   biopsy showing vulvar atypia, possible VIN I   Family History  Problem Relation Age of Onset   Colon cancer Mother        Dx age 35, died at age 88   Diabetes Father    Prostate cancer Father    Prostate cancer Brother    Pancreatic cancer Brother    Stomach cancer Son    Past Surgical History:  Procedure Laterality Date   ANTERIOR AND POSTERIOR VAGINAL REPAIR  01/2002   thelbert 01/23/2011   APPENDECTOMY  1948   CARDIAC CATHETERIZATION  06/26/2017   CORONARY ANGIOPLASTY WITH STENT PLACEMENT  07/01/2017   CORONARY ATHERECTOMY N/A 07/01/2017   Procedure: CORONARY ATHERECTOMY;  Surgeon: Claudene Victory ORN, MD;  Location: [facility]IVE [facility];  Service: Cardiovascular;  Laterality: N/A;   CORONARY STENT INTERVENTION N/A 07/01/2017   Procedure: CORONARY STENT INTERVENTION;  Surgeon: Claudene Victory ORN, MD;  Location: [facility];  Service: Cardiovascular;  Laterality: N/A;   HAMMER TOE SURGERY     HEMORRHOID BANDING     HIP SURGERY Left 04/2009   hip examination under anesthesia followed by greater trochanteric bursectomy; iliotibial band tenotomy/notes 01/20/2011   I & D EXTREMITY Right 01/10/2018   Procedure: IRRIGATION AND DEBRIDEMENT RIGHT KNEE, APPLY WOUND VAC;  Surgeon: Harden Jerona GAILS, MD;  Location: [facility] OR;  Service: Orthopedics;  Laterality: Right;   KNEE BURSECTOMY Right 04/2009   thelbert 01/09/2011   LEFT ATRIAL APPENDAGE OCCLUSION N/A 08/24/2021   Procedure: LEFT ATRIAL APPENDAGE OCCLUSION;  Surgeon: Wonda Sharper, MD;  Location: [facility] INVASIVE CV LAB;  Service: Cardiovascular;  Laterality: N/A;   LEFT HEART CATH AND CORONARY ANGIOGRAPHY N/A 06/26/2017   Procedure: LEFT HEART CATH AND CORONARY ANGIOGRAPHY;  Surgeon: Claudene Victory ORN, MD;  Location: [facility];  Service: Cardiovascular;  Laterality: N/A;   PUBOVAGINAL SLING  01/2002   thelbert 01/23/2011   REDUCTION MAMMAPLASTY     TEE WITHOUT CARDIOVERSION N/A 08/24/2021   Procedure: TRANSESOPHAGEAL ECHOCARDIOGRAM (TEE);  Surgeon: Wonda Sharper, MD;  Location: [place] INVASIVE CV LAB;  Service: Cardiovascular;  Laterality: N/A;   TEMPORARY PACEMAKER N/A 07/01/2017   Procedure: TEMPORARY PACEMAKER;  Surgeon: Claudene Victory ORN, MD;  Location: [facility] INVASIVE CV LAB;  Service: Cardiovascular;  Laterality: N/A;   VAGINAL HYSTERECTOMY  01/2002   Vaginal hysterectomy, bilateral salpingo-oophorectomy/notes 01/23/2011   Social History   Occupational History   Occupation: Engineer, [employer]: RETIRED  Tobacco Use   Smoking status: Former    Types: Cigarettes   Smokeless tobacco: Never  Vaping Use   Vaping status: Never Used  Substance and Sexual Activity   Alcohol use: Yes    Comment: moderatley [place] before dinner   Drug use: Never   Sexual activity: Not Currently    Birth control/protection: Surgical    Comment: hysterectomy; less than 5, IC after 18, no STD, no abnormal paps

## 2024-07-13 NOTE — Progress Notes (Signed)
 GYNECOLOGY  VISIT   HPI: 89 y.o.   Married  Caucasian female   G2P2000 with No LMP recorded. Patient has had a hysterectomy.   here for: Bleeding and irritation on vulva. Happening for couple weeks. Has tried multiple creams but have not worked.    Hx HSV I, lichen sclerosus, and VIN I of vulva.     Pain comes and goes.   Having itching sometimes.  Feels irritation around the vagina.   Bleeds with wiping.    Clobetasol  made it better for a day or two.  Vaseline did not improve it.    Has lidocaine  gel at home and has not used it.    Treated for recurrent C Diff.   Dealing with back pain.  Is on Jardiance  and worried about potential bladder infection.   GYNECOLOGIC HISTORY: No LMP recorded. Patient has had a hysterectomy. Contraception:  Hyst Menopausal hormone therapy:  n/a Last 2 paps:  Many years ago History of abnormal Pap or positive HPV:  no Mammogram:  06/03/24 Breast Density Cat B, BIRADS Cat 1 neg         OB History     Gravida  2   Para  2   Term  2   Preterm  0   AB  0   Living         SAB  0   IAB  0   Ectopic  0   Multiple      Live Births                 Patient Active Problem List   Diagnosis Date Noted   C. difficile colitis 06/01/2024   Clostridioides difficile infection 10/22/2023   Supraclavicular fossa fullness 09/24/2023   COVID-19 09/24/2023   Cough 06/28/2023   Degenerative tear of left medial meniscus 06/20/2023   History of skin cancer 05/20/2023   Melanocytic nevi of trunk 05/20/2023   Seborrheic keratoses 05/20/2023   Squamous cell carcinoma of skin of right lower extremity 05/20/2023   Mitral regurgitation 02/22/2023   Aneurysm of ascending aorta without rupture 02/18/2023   Cellulitis of left leg 02/18/2023   Closed comminuted fracture of left humerus 02/07/2023   Bradycardia 02/07/2023   Fall 02/07/2023   Chronic a-fib (HCC) 02/07/2023   Statin intolerance 12/31/2022   Acute pain of right knee  12/06/2022   Impetigo 10/01/2022   Insomnia 10/01/2022   Osteoporosis 06/11/2022   Weakness 04/25/2022   Weakness generalized 04/25/2022   Falls frequently 04/19/2022   Iron  deficiency anemia 12/08/2021   Skin ulcer, limited to breakdown of skin (HCC) 11/22/2021   Anemia, iron  deficiency 11/22/2021   Low serum vitamin B12 11/22/2021   Acute renal failure superimposed on stage 2 chronic kidney disease 11/20/2021   ABLA (acute blood loss anemia) 11/20/2021   Hypotension 11/19/2021   Shortness of breath 06/12/2021   Right leg pain 02/21/2021   History of Clostridioides difficile colitis    Abnormal CT of the abdomen    Aortic atherosclerosis 08/27/2020   Acute prerenal azotemia 08/27/2020   Shock circulatory (HCC) 08/27/2020   Metabolic acidosis with normal anion gap and bicarbonate losses 08/27/2020   Abdominal cramps 03/16/2020   Neurogenic orthostatic hypotension (HCC) 09/16/2019   Greater trochanteric pain syndrome of right lower extremity 07/30/2019   Abdominal pain 05/22/2018   Bradycardia with 41-50 beats per minute 12/27/2017   Right bundle branch block (RBBB) on electrocardiogram (ECG) 12/27/2017   Chronic kidney disease, stage 3b (  HCC) 12/24/2017   Cellulitis of leg, right with large prepatella hematoma and open wounds 12/24/2017   Iliotibial band syndrome of right side 12/16/2017   Intervertebral lumbar disc disorder with myelopathy, lumbar region 04/25/2017   Family history of colon cancer in mother 01/22/2017   Primary osteoarthritis of both first carpometacarpal joints 01/02/2017   Pain 11/21/2016   Gastroenteritis 09/13/2016   Chronic anticoagulation 05/24/2015   Coronary artery disease involving native coronary artery of native heart with angina pectoris 12/20/2014   Diarrhea 12/22/2013   Permanent atrial fibrillation (HCC) 11/21/2012   Greater trochanteric bursitis of both hips 05/21/2012   Well adult exam 11/07/2011   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    Bruising 12/18/2010   Osteoarthritis of left hip 07/03/2010   DEGENERATIVE DISC DISEASE, LUMBOSACRAL SPINE 05/18/2010   SYNCOPE 10/27/2008   Anxiety 05/21/2008   IBS 05/21/2008   Essential hypertension 06/16/2007   GERD (gastroesophageal reflux disease) 06/16/2007    Past Medical History:  Diagnosis Date   Acute blood loss anemia 12/25/2017   Allergic rhinitis    Anxiety    Arthritis    my whole spine (07/01/2017)   Arthritis    Atrial fibrillation (HCC)    Bowel obstruction (HCC)    in [place]    Bradycardia with 41-50 beats per minute 12/27/2017   Cancer (HCC)    Cellulitis of leg, right with large prepatella hematoma and open wounds 12/26/2017   CKD (chronic kidney disease) stage 2, GFR 60-89 ml/min    Colon polyps    Coronary artery disease    10/18 PCI/DES to p/m LCx with cutting balloon to mLcx   Diverticulosis of colon    GERD (gastroesophageal reflux disease)    Hip bursitis 2010   Dr Jacklin, Post op seroma   History of colon polyps    HSV (herpes simplex virus) anogenital infection 07/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   HTN (hypertension)    IBS (irritable bowel syndrome)    constipation predominant - Dr Luis   Lichen sclerosus    Osteopenia 11/2016   T score -2.0 FRAX 15%/4.3%   PAC (premature atrial contraction)    Symptomatiic   Renal insufficiency    Right bundle branch block (RBBB) on electrocardiogram (ECG) 12/27/2017   Scoliosis    SVT (supraventricular tachycardia)    brief history   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   biopsy showing vulvar atypia, possible VIN I    Past Surgical History:  Procedure Laterality Date   ANTERIOR AND POSTERIOR VAGINAL REPAIR  01/2002   thelbert 01/23/2011   APPENDECTOMY  1946   CARDIAC CATHETERIZATION  06/26/2017   CORONARY ANGIOPLASTY WITH STENT PLACEMENT  07/01/2017   CORONARY ATHERECTOMY N/A 07/01/2017   Procedure: CORONARY ATHERECTOMY;  Surgeon: Claudene Victory ORN, MD;  Location: [facility] INVASIVE  CV LAB;  Service: Cardiovascular;  Laterality: N/A;   CORONARY STENT INTERVENTION N/A 07/01/2017   Procedure: CORONARY STENT INTERVENTION;  Surgeon: Claudene Victory ORN, MD;  Location: [facility] INVASIVE CV LAB;  Service: Cardiovascular;  Laterality: N/A;   HAMMER TOE SURGERY     HEMORRHOID BANDING     HIP SURGERY Left 04/2009   hip examination under anesthesia followed by greater trochanteric bursectomy; iliotibial band tenotomy/notes 01/20/2011   I & D EXTREMITY Right 01/10/2018   Procedure: IRRIGATION AND DEBRIDEMENT RIGHT KNEE, APPLY WOUND VAC;  Surgeon: Harden Jerona GAILS, MD;  Location: [facility] OR;  Service: Orthopedics;  Laterality: Right;   KNEE BURSECTOMY Right  04/2009   thelbert 01/09/2011   LEFT ATRIAL APPENDAGE OCCLUSION N/A 08/24/2021   Procedure: LEFT ATRIAL APPENDAGE OCCLUSION;  Surgeon: Wonda Sharper, MD;  Location: [facility] INVASIVE CV LAB;  Service: Cardiovascular;  Laterality: N/A;   LEFT HEART CATH AND CORONARY ANGIOGRAPHY N/A 06/26/2017   Procedure: LEFT HEART CATH AND CORONARY ANGIOGRAPHY;  Surgeon: Claudene Victory ORN, MD;  Location: [facility] INVASIVE CV LAB;  Service: Cardiovascular;  Laterality: N/A;   PUBOVAGINAL SLING  01/2002   thelbert 01/23/2011   REDUCTION MAMMAPLASTY     TEE WITHOUT CARDIOVERSION N/A 08/24/2021   Procedure: TRANSESOPHAGEAL ECHOCARDIOGRAM (TEE);  Surgeon: Wonda Sharper, MD;  Location: [facility] INVASIVE CV LAB;  Service: Cardiovascular;  Laterality: N/A;   TEMPORARY PACEMAKER N/A 07/01/2017   Procedure: TEMPORARY PACEMAKER;  Surgeon: Claudene Victory ORN, MD;  Location[facility] INVASIVE CV LAB;  Service: Cardiovascular;  Laterality: N/A;   VAGINAL HYSTERECTOMY  01/2002   Vaginal hysterectomy, bilateral salpingo-oophorectomy/notes 01/23/2011    Current Outpatient Medications  Medication Sig Dispense Refill   acetaminophen  (TYLENOL ) 500 MG tablet Take 1,000 mg by mouth every 6 (six) hours as needed (body aches / sore hip). Maximum of 6 tablets daily (3000mg )     apixaban  (ELIQUIS ) 2.5 MG TABS tablet TAKE  1 TABLET BY MOUTH TWICE  DAILY 200 tablet 1   BIOTIN PO Take 5,000 mcg by mouth in the morning.     clobetasol  ointment (TEMOVATE ) 0.05 % Apply to the affected area in a thin layer twice a day for 2 weeks as needed for a flare.  Then apply to the area twice a week at bedtime as maintenance dosing. 60 g 1   [employer] Sunscreen SPF 30 LOTN apply topically to sun exposed areas Daily; Duration: 30     diclofenac  Sodium (VOLTAREN ) 1 % GEL Apply 2 g topically 4 (four) times daily as needed (back pain.).     dicyclomine  (BENTYL ) 20 MG tablet Take 1 by mouth every 4-6 hours as needed for cramping 30 tablet 1   empagliflozin  (JARDIANCE ) 10 MG TABS tablet TAKE 1 TABLET BY MOUTH DAILY  BEFORE BREAKFAST 90 tablet 3   escitalopram  (LEXAPRO ) 10 MG tablet Take 1 tablet (10 mg total) by mouth daily. 30 tablet 11   esomeprazole  (NEXIUM ) 40 MG capsule TAKE 1 CAPSULE BY MOUTH EVERY DAY 90 capsule 1   fluorouracil (EFUDEX) 5 % cream 1 application (HOLD FOR SEVERE INFLAMMATION) Externally TO PRECANCERS once a day; Duration: 21 days     Homeopathic Products (ARNICARE) GEL Apply 1 application  topically daily as needed (skin bruising).     hyoscyamine  (LEVSIN ) 0.125 MG tablet TAKE 1-2 TABLETS EVERY 4 HOURS AS NEEDED FOR UP TO 10 DAYS FOR CRAMPING. 100 tablet 3   LORazepam  (ATIVAN ) 1 MG tablet TAKE ONE TABLET BY MOUTH TWICE DAILY AS NEEDED FOR ANXIETY / SLEEP. 180 tablet 1   losartan  (COZAAR ) 25 MG tablet Take 1 tablet (25 mg total) by mouth daily. 90 tablet 3   mupirocin  ointment (BACTROBAN ) 2 % On leg wound w/dressing change qd or bid 30 g 1   nitroGLYCERIN  (NITROSTAT ) 0.4 MG SL tablet Place 1 tablet (0.4 mg total) under the tongue every 5 (five) minutes as needed for chest pain (Call 911 at 3rd dose within 15 minutes.). 20 tablet 3   NON FORMULARY Vitamin c, vitamin d      ondansetron  (ZOFRAN -ODT) 8 MG disintegrating tablet dissolve 1 tablet (8 mg total) in mouth every 8 (eight) hours as needed for nausea or vomiting. 12  tablet 3   Polyethyl Glycol-Propyl Glycol (LUBRICANT EYE DROPS) 0.4-0.3 % SOLN Place 1-2 drops into both eyes at bedtime.     polyethylene glycol (MIRALAX  / GLYCOLAX ) 17 g packet Take 17 g by mouth as needed for mild constipation or moderate constipation.     saccharomyces boulardii (FLORASTOR) 250 MG capsule Take 1 capsule (250 mg total) by mouth 2 (two) times daily. 60 capsule 1   sodium chloride  (OCEAN) 0.65 % SOLN nasal spray Place 1 spray into both nostrils at bedtime as needed for congestion.     valACYclovir  (VALTREX ) 500 MG tablet Take 1 tablet (500 mg total) by mouth daily. Take one tablet by mouth daily for 3 days for an outbreak 30 tablet 1   Estradiol  0.75 MG/1.25 GM (0.06%) topical gel 1 pump to skin to arm Transdermal Once a day; Duration: 30 day(s) (Patient not taking: Reported on 07/13/2024)     No current facility-administered medications for this visit.     ALLERGIES: Macrobid  [nitrofurantoin  monohyd macro], Meloxicam , Digoxin  and related, Diprolene  [betamethasone  dipropionate aug], Ferrous sulfate, Keflex  [cephalexin ], Lipitor  [atorvastatin ], Mobic  [meloxicam ], and Sulfamethoxazole -trimethoprim   Family History  Problem Relation Age of Onset   Colon cancer Mother        Dx age 73, died at age 58   Diabetes Father    Prostate cancer Father    Prostate cancer Brother    Pancreatic cancer Brother    Stomach cancer Son     Social History   Socioeconomic History   Marital status: Married    Spouse name: [name]   Number of children: 2   Years of education: Not on file   Highest education level: Bachelor's degree (e.g., BA, AB, BS)  Occupational History   Occupation: Engineer, [employer]: RETIRED  Tobacco Use   Smoking status: Former    Types: Cigarettes   Smokeless tobacco: Never  Vaping Use   Vaping status: Never Used  Substance and Sexual Activity   Alcohol use: Yes    Comment: moderatley [place] before dinner   Drug use: Never   Sexual activity: Not  Currently    Birth control/protection: Surgical    Comment: hysterectomy; less than 5, IC after 18, no STD, no abnormal paps  Other Topics Concern   Not on file  Social History Narrative   ** Merged History Encounter ** Regular Exercise -  YES      Lives at home with husband/2025   Social Drivers of Health   Financial Resource Strain: Low Risk  (11/22/2023)   Overall Financial Resource Strain (CARDIA)    Difficulty of Paying Living Expenses: Not hard at all  Food Insecurity: No Food Insecurity (11/22/2023)   Hunger Vital Sign    Worried About Running Out of Food in the Last Year: Never true    Ran Out of Food in the Last Year: Never true  Transportation Needs: No Transportation Needs (11/22/2023)   PRAPARE - Administrator, Civil Service (Medical): No    Lack of Transportation (Non-Medical): No  Physical Activity: Insufficiently Active (11/22/2023)   Exercise Vital Sign    Days of Exercise per Week: 2 days    Minutes of Exercise per Session: 60 min  Stress: No Stress Concern Present (11/22/2023)   [employer] of Occupational Health - Occupational Stress Questionnaire    Feeling of Stress : Only a little  Social Connections: Moderately Isolated (11/22/2023)   Social Connection and Isolation Panel    Frequency of  Communication with Friends and Family: More than three times a week    Frequency of Social Gatherings with Friends and Family: Three times a week    Attends Religious Services: Never    Active Member of Clubs or Organizations: No    Attends Banker Meetings: Never    Marital Status: Married  Catering Manager Violence: Not At Risk (11/22/2023)   Humiliation, Afraid, Rape, and Kick questionnaire    Fear of Current or Ex-Partner: No    Emotionally Abused: No    Physically Abused: No    Sexually Abused: No    Review of Systems  All other systems reviewed and are negative.   PHYSICAL EXAMINATION:   BP 118/74 (BP Location: Left Arm, Patient  Position: Sitting)   Pulse 70   SpO2 95%     General appearance: alert, cooperative and appears stated age  Pelvic: External genitalia:  left labia minora and majora with multiple satellite ulcerations.               Urethra:  normal appearing urethra with no masses, tenderness or lesions              Bartholins and Skenes: normal                 Vagina: normal appearing vagina with normal color and discharge, no lesions.  Fullness at vaginal apex?              Cervix: absent                Bimanual Exam:  Uterus:  absent              Adnexa: no mass, fullness, tenderness              Rectal exam: yes.  Stool in rectal vault.               Anus:  normal sphincter tone, no lesions  Chaperone was present for exam:  Kari HERO, CMA  ASSESSMENT:  Vulvovaginitis.  Vaginal discomfort.   HSV I.  Looks like an outbreak today. VIN I.  Lichen sclerosus of the vulva.  GFR decreased, 53.24 on 04/21/24. Recurrent C diff.  Atrial fibrillation.  On Eliquis . Factor XI deficiency.   PLAN:  We discussed HSV infection and also lichen sclerosus and association with vulvar dysplasia and vulvar cancer. Valtrex  500 mg daily x 3 days as needed for an outbreak.  #30, RF 0.  Lidocaine  gel to vulva three times daily as needed.  She has this at home.  Nuswab for vaginitis testing.  Urinalysis and reflex culture.  Return for a recheck in 4 weeks.   30 min  total time was spent for this patient encounter, including preparation, face-to-face counseling with the patient, coordination of care, and documentation of the encounter.

## 2024-07-13 NOTE — Progress Notes (Signed)
 Pain Scale   Average Pain 7 Patient advising she has chronic lower back pain radiating to right leg, patient advising the last injection she got did not help with her pain.         +Driver, -BT, -Dye Allergies.

## 2024-07-14 ENCOUNTER — Ambulatory Visit: Payer: Self-pay | Admitting: Obstetrics and Gynecology

## 2024-07-15 ENCOUNTER — Encounter: Payer: Self-pay | Admitting: Physical Medicine and Rehabilitation

## 2024-07-15 LAB — URINE CULTURE
MICRO NUMBER:: 17187377
SPECIMEN QUALITY:: ADEQUATE

## 2024-07-15 LAB — URINALYSIS, COMPLETE W/RFL CULTURE
Bacteria, UA: NONE SEEN /HPF
Bilirubin Urine: NEGATIVE
Hgb urine dipstick: NEGATIVE
Hyaline Cast: NONE SEEN /LPF
Ketones, ur: NEGATIVE
Nitrites, Initial: NEGATIVE
RBC / HPF: NONE SEEN /HPF (ref 0–2)
Specific Gravity, Urine: 1.022 (ref 1.001–1.035)
Squamous Epithelial / HPF: NONE SEEN /HPF (ref <=5)
pH: 5.5 (ref 5.0–8.0)

## 2024-07-15 LAB — CERVICOVAGINAL ANCILLARY ONLY
Bacterial Vaginitis (gardnerella): NEGATIVE
Candida Glabrata: NEGATIVE
Candida Vaginitis: POSITIVE — AB
Comment: NEGATIVE
Comment: NEGATIVE
Comment: NEGATIVE
Comment: NEGATIVE
Trichomonas: NEGATIVE

## 2024-07-15 LAB — CULTURE INDICATED

## 2024-07-15 MED ORDER — FLUCONAZOLE 150 MG PO TABS: 150.0000 mg | ORAL_TABLET | Freq: Once | ORAL | 0 refills | Status: AC

## 2024-07-17 ENCOUNTER — Other Ambulatory Visit: Payer: Self-pay | Admitting: Internal Medicine

## 2024-07-17 MED ORDER — GABAPENTIN 100 MG PO CAPS: ORAL_CAPSULE | ORAL | 3 refills | Status: DC

## 2024-07-19 ENCOUNTER — Emergency Department (HOSPITAL_COMMUNITY)

## 2024-07-19 ENCOUNTER — Encounter (HOSPITAL_COMMUNITY): Payer: Self-pay

## 2024-07-19 ENCOUNTER — Other Ambulatory Visit: Payer: Self-pay

## 2024-07-19 ENCOUNTER — Inpatient Hospital Stay (HOSPITAL_COMMUNITY)
Admission: EM | Admit: 2024-07-19 | Discharge: 2024-07-21 | DRG: 372 | Disposition: A | Discharge status: Home / Self Care | Attending: Student | Admitting: Student

## 2024-07-19 DIAGNOSIS — N289 Disorder of kidney and ureter, unspecified: Secondary | ICD-10-CM | POA: Diagnosis present

## 2024-07-19 DIAGNOSIS — E1122 Type 2 diabetes mellitus with diabetic chronic kidney disease: Secondary | ICD-10-CM | POA: Diagnosis present

## 2024-07-19 DIAGNOSIS — Z7901 Long term (current) use of anticoagulants: Secondary | ICD-10-CM

## 2024-07-19 DIAGNOSIS — M8580 Other specified disorders of bone density and structure, unspecified site: Secondary | ICD-10-CM | POA: Diagnosis present

## 2024-07-19 DIAGNOSIS — I2510 Atherosclerotic heart disease of native coronary artery without angina pectoris: Secondary | ICD-10-CM | POA: Diagnosis present

## 2024-07-19 DIAGNOSIS — Z87891 Personal history of nicotine dependence: Secondary | ICD-10-CM

## 2024-07-19 DIAGNOSIS — A029 Salmonella infection, unspecified: Secondary | ICD-10-CM | POA: Diagnosis present

## 2024-07-19 DIAGNOSIS — Z794 Long term (current) use of insulin: Secondary | ICD-10-CM | POA: Diagnosis not present

## 2024-07-19 DIAGNOSIS — I471 Supraventricular tachycardia, unspecified: Secondary | ICD-10-CM | POA: Diagnosis present

## 2024-07-19 DIAGNOSIS — F39 Unspecified mood [affective] disorder: Secondary | ICD-10-CM | POA: Diagnosis present

## 2024-07-19 DIAGNOSIS — A0472 Enterocolitis due to Clostridium difficile, not specified as recurrent: Secondary | ICD-10-CM | POA: Diagnosis not present

## 2024-07-19 DIAGNOSIS — K589 Irritable bowel syndrome without diarrhea: Secondary | ICD-10-CM | POA: Diagnosis present

## 2024-07-19 DIAGNOSIS — F419 Anxiety disorder, unspecified: Secondary | ICD-10-CM | POA: Diagnosis present

## 2024-07-19 DIAGNOSIS — I480 Paroxysmal atrial fibrillation: Secondary | ICD-10-CM | POA: Diagnosis present

## 2024-07-19 DIAGNOSIS — Z860100 Personal history of colon polyps, unspecified: Secondary | ICD-10-CM | POA: Diagnosis not present

## 2024-07-19 DIAGNOSIS — I959 Hypotension, unspecified: Secondary | ICD-10-CM

## 2024-07-19 DIAGNOSIS — N182 Chronic kidney disease, stage 2 (mild): Secondary | ICD-10-CM | POA: Diagnosis present

## 2024-07-19 DIAGNOSIS — Z800 Family history of malignant neoplasm of digestive organs: Secondary | ICD-10-CM | POA: Diagnosis not present

## 2024-07-19 DIAGNOSIS — K529 Noninfective gastroenteritis and colitis, unspecified: Secondary | ICD-10-CM | POA: Diagnosis present

## 2024-07-19 DIAGNOSIS — Z79899 Other long term (current) drug therapy: Secondary | ICD-10-CM

## 2024-07-19 DIAGNOSIS — I129 Hypertensive chronic kidney disease with stage 1 through stage 4 chronic kidney disease, or unspecified chronic kidney disease: Secondary | ICD-10-CM | POA: Diagnosis present

## 2024-07-19 DIAGNOSIS — M419 Scoliosis, unspecified: Secondary | ICD-10-CM | POA: Diagnosis present

## 2024-07-19 DIAGNOSIS — D509 Iron deficiency anemia, unspecified: Secondary | ICD-10-CM | POA: Diagnosis present

## 2024-07-19 DIAGNOSIS — A0471 Enterocolitis due to Clostridium difficile, recurrent: Secondary | ICD-10-CM | POA: Diagnosis present

## 2024-07-19 DIAGNOSIS — Z8042 Family history of malignant neoplasm of prostate: Secondary | ICD-10-CM | POA: Diagnosis not present

## 2024-07-19 DIAGNOSIS — E1140 Type 2 diabetes mellitus with diabetic neuropathy, unspecified: Secondary | ICD-10-CM | POA: Diagnosis present

## 2024-07-19 DIAGNOSIS — Z90710 Acquired absence of both cervix and uterus: Secondary | ICD-10-CM

## 2024-07-19 DIAGNOSIS — Z955 Presence of coronary angioplasty implant and graft: Secondary | ICD-10-CM | POA: Diagnosis not present

## 2024-07-19 DIAGNOSIS — Z888 Allergy status to other drugs, medicaments and biological substances status: Secondary | ICD-10-CM

## 2024-07-19 DIAGNOSIS — Z833 Family history of diabetes mellitus: Secondary | ICD-10-CM | POA: Diagnosis not present

## 2024-07-19 DIAGNOSIS — N179 Acute kidney failure, unspecified: Secondary | ICD-10-CM | POA: Diagnosis present

## 2024-07-19 DIAGNOSIS — Z7984 Long term (current) use of oral hypoglycemic drugs: Secondary | ICD-10-CM

## 2024-07-19 DIAGNOSIS — Z882 Allergy status to sulfonamides status: Secondary | ICD-10-CM

## 2024-07-19 DIAGNOSIS — Z886 Allergy status to analgesic agent status: Secondary | ICD-10-CM

## 2024-07-19 DIAGNOSIS — D696 Thrombocytopenia, unspecified: Secondary | ICD-10-CM | POA: Diagnosis present

## 2024-07-19 DIAGNOSIS — I4510 Unspecified right bundle-branch block: Secondary | ICD-10-CM | POA: Diagnosis present

## 2024-07-19 DIAGNOSIS — R1032 Left lower quadrant pain: Principal | ICD-10-CM

## 2024-07-19 LAB — CBC
HCT: 42 % (ref 36.0–46.0)
Hemoglobin: 13.5 g/dL (ref 12.0–15.0)
MCH: 32 pg (ref 26.0–34.0)
MCHC: 32.1 g/dL (ref 30.0–36.0)
MCV: 99.5 fL (ref 80.0–100.0)
Platelets: 203 K/uL (ref 150–400)
RBC: 4.22 MIL/uL (ref 3.87–5.11)
RDW: 14.6 % (ref 11.5–15.5)
WBC: 6.7 K/uL (ref 4.0–10.5)
nRBC: 0 % (ref 0.0–0.2)

## 2024-07-19 LAB — COMPREHENSIVE METABOLIC PANEL WITH GFR
ALT: 7 U/L (ref 0–44)
AST: 21 U/L (ref 15–41)
Albumin: 4.2 g/dL (ref 3.5–5.0)
Alkaline Phosphatase: 77 U/L (ref 38–126)
Anion gap: 11 (ref 5–15)
BUN: 27 mg/dL — ABNORMAL HIGH (ref 8–23)
CO2: 24 mmol/L (ref 22–32)
Calcium: 9.6 mg/dL (ref 8.9–10.3)
Chloride: 104 mmol/L (ref 98–111)
Creatinine, Ser: 1.02 mg/dL — ABNORMAL HIGH (ref 0.44–1.00)
GFR, Estimated: 53 mL/min — ABNORMAL LOW (ref >60)
Glucose, Bld: 103 mg/dL — ABNORMAL HIGH (ref 70–99)
Potassium: 4.1 mmol/L (ref 3.5–5.1)
Sodium: 139 mmol/L (ref 135–145)
Total Bilirubin: 0.3 mg/dL (ref 0.0–1.2)
Total Protein: 6.7 g/dL (ref 6.5–8.1)

## 2024-07-19 LAB — GASTROINTESTINAL PANEL BY PCR, STOOL (REPLACES STOOL CULTURE)

## 2024-07-19 LAB — HEMOGLOBIN A1C
Hgb A1c MFr Bld: 5.1 % (ref 4.8–5.6)
Mean Plasma Glucose: 99.67 mg/dL

## 2024-07-19 LAB — GLUCOSE, CAPILLARY: Glucose-Capillary: 137 mg/dL — ABNORMAL HIGH (ref 70–99)

## 2024-07-19 LAB — LIPASE, BLOOD: Lipase: 54 U/L — ABNORMAL HIGH (ref 11–51)

## 2024-07-19 LAB — CLOSTRIDIUM DIFFICILE BY PCR, REFLEXED
Hypervirulent Strain: NEGATIVE
Toxigenic C. Difficile by PCR: POSITIVE — AB

## 2024-07-19 LAB — I-STAT CG4 LACTIC ACID, ED: Lactic Acid, Venous: 1.8 mmol/L (ref 0.5–1.9)

## 2024-07-19 LAB — C DIFFICILE QUICK SCREEN W PCR REFLEX
C Diff antigen: POSITIVE — AB
C Diff toxin: NEGATIVE

## 2024-07-19 LAB — CBG MONITORING, ED: Glucose-Capillary: 85 mg/dL (ref 70–99)

## 2024-07-19 MED ORDER — LACTATED RINGERS IV BOLUS
1000.0000 mL | Freq: Once | INTRAVENOUS | Status: AC
  Administered 2024-07-19: 1000 mL via INTRAVENOUS

## 2024-07-19 MED ORDER — CIPROFLOXACIN IN D5W 400 MG/200ML IV SOLN: 400.0000 mg | Freq: Two times a day (BID) | INTRAVENOUS | Status: DC

## 2024-07-19 MED ORDER — HYOSCYAMINE SULFATE 0.125 MG PO TBDP: 0.1250 mg | ORAL_TABLET | Freq: Four times a day (QID) | ORAL | Status: DC | PRN

## 2024-07-19 MED ORDER — INSULIN ASPART 100 UNIT/ML IJ SOLN
0.0000 [IU] | Freq: Three times a day (TID) | INTRAMUSCULAR | Status: DC
  Administered 2024-07-19: 1 [IU] via SUBCUTANEOUS
  Filled 2024-07-19: qty 1

## 2024-07-19 MED ORDER — LORAZEPAM 0.5 MG PO TABS
0.5000 mg | ORAL_TABLET | Freq: Four times a day (QID) | ORAL | Status: DC | PRN
Start: 2024-07-19 — End: 2024-07-21
  Administered 2024-07-21: 0.5 mg via ORAL
  Filled 2024-07-19: qty 1

## 2024-07-19 MED ORDER — VANCOMYCIN HCL 125 MG PO CAPS
125.0000 mg | ORAL_CAPSULE | Freq: Four times a day (QID) | ORAL | Status: DC
  Administered 2024-07-19 – 2024-07-21 (×9): 125 mg via ORAL
  Filled 2024-07-19 (×12): qty 1

## 2024-07-19 MED ORDER — ONDANSETRON HCL 4 MG/2ML IJ SOLN
4.0000 mg | Freq: Once | INTRAMUSCULAR | Status: AC
  Administered 2024-07-19: 4 mg via INTRAVENOUS
  Filled 2024-07-19: qty 2

## 2024-07-19 MED ORDER — SACCHAROMYCES BOULARDII 250 MG PO CAPS
250.0000 mg | ORAL_CAPSULE | Freq: Two times a day (BID) | ORAL | Status: DC
  Administered 2024-07-19 – 2024-07-21 (×4): 250 mg via ORAL
  Filled 2024-07-19 (×4): qty 1

## 2024-07-19 MED ORDER — HYDROMORPHONE HCL 1 MG/ML IJ SOLN
0.5000 mg | INTRAMUSCULAR | Status: DC | PRN
  Administered 2024-07-19 – 2024-07-20 (×6): 0.5 mg via INTRAVENOUS
  Filled 2024-07-19 (×6): qty 0.5

## 2024-07-19 MED ORDER — ESCITALOPRAM OXALATE 10 MG PO TABS
10.0000 mg | ORAL_TABLET | Freq: Every day | ORAL | Status: DC
  Administered 2024-07-19 – 2024-07-20 (×2): 10 mg via ORAL
  Filled 2024-07-19 (×2): qty 1

## 2024-07-19 MED ORDER — METRONIDAZOLE 500 MG/100ML IV SOLN: 500.0000 mg | Freq: Two times a day (BID) | INTRAVENOUS | Status: DC

## 2024-07-19 MED ORDER — APIXABAN 2.5 MG PO TABS
2.5000 mg | ORAL_TABLET | Freq: Two times a day (BID) | ORAL | Status: DC
  Administered 2024-07-19 – 2024-07-21 (×5): 2.5 mg via ORAL
  Filled 2024-07-19 (×5): qty 1

## 2024-07-19 MED ORDER — ONDANSETRON HCL 4 MG/2ML IJ SOLN: 4.0000 mg | Freq: Four times a day (QID) | INTRAMUSCULAR | Status: DC | PRN

## 2024-07-19 MED ORDER — SODIUM CHLORIDE 0.9 % IV SOLN
INTRAVENOUS | Status: AC
  Administered 2024-07-19: 1000 mL via INTRAVENOUS

## 2024-07-19 MED ORDER — METOCLOPRAMIDE HCL 5 MG/ML IJ SOLN: 10.0000 mg | Freq: Once | INTRAMUSCULAR | Status: DC

## 2024-07-19 MED ORDER — IOHEXOL 300 MG/ML  SOLN
100.0000 mL | Freq: Once | INTRAMUSCULAR | Status: AC | PRN
  Administered 2024-07-19: 80 mL via INTRAVENOUS

## 2024-07-19 MED ORDER — PANTOPRAZOLE SODIUM 40 MG PO TBEC
40.0000 mg | DELAYED_RELEASE_TABLET | Freq: Every day | ORAL | Status: DC
  Administered 2024-07-19 – 2024-07-21 (×3): 40 mg via ORAL
  Filled 2024-07-19 (×3): qty 1

## 2024-07-19 MED ORDER — GABAPENTIN 100 MG PO CAPS
100.0000 mg | ORAL_CAPSULE | Freq: Every day | ORAL | Status: DC
  Administered 2024-07-20: 100 mg via ORAL
  Filled 2024-07-19 (×2): qty 1

## 2024-07-19 MED ORDER — ACETAMINOPHEN 650 MG RE SUPP: 650.0000 mg | Freq: Four times a day (QID) | RECTAL | Status: DC | PRN

## 2024-07-19 MED ORDER — ACETAMINOPHEN 325 MG PO TABS
650.0000 mg | ORAL_TABLET | Freq: Four times a day (QID) | ORAL | Status: DC | PRN
  Administered 2024-07-21: 650 mg via ORAL
  Filled 2024-07-19: qty 2

## 2024-07-19 MED ORDER — DIPHENHYDRAMINE HCL 25 MG PO CAPS: 25.0000 mg | ORAL_CAPSULE | Freq: Once | ORAL | Status: DC

## 2024-07-19 MED ORDER — ONDANSETRON HCL 4 MG PO TABS
4.0000 mg | ORAL_TABLET | Freq: Four times a day (QID) | ORAL | Status: DC | PRN
  Administered 2024-07-20: 4 mg via ORAL
  Filled 2024-07-19: qty 1

## 2024-07-19 MED ORDER — DICYCLOMINE HCL 10 MG PO CAPS
20.0000 mg | ORAL_CAPSULE | Freq: Once | ORAL | Status: AC
  Administered 2024-07-19: 20 mg via ORAL
  Filled 2024-07-19: qty 2

## 2024-07-19 NOTE — ED Provider Notes (Signed)
 [facility] AT [facility] Provider Note   CSN: [id number] Arrival date & time: 07/19/24  9350     Patient presents with: Abdominal Pain   Hailey [name] is a 89 y.o. female.    Abdominal Pain  Patient is an 89 year old female with past medical history significant for C. difficile, hypertension, diverticulitis, CAD, HSV, renal insufficiency, A-fib, CKD, SVT, bowel obstructions  Abdominal pain started this AM and described as crampy 10/10 pain.  Just had BM. No vomiting but was nauseated and dry heaved.        Prior to Admission medications   Medication Sig Start Date End Date Taking? Authorizing Provider  acetaminophen  (TYLENOL ) 500 MG tablet Take 1,000 mg by mouth every 6 (six) hours as needed (body aches / sore hip). Maximum of 6 tablets daily (3000mg )    [provider]  apixaban  (ELIQUIS ) 2.5 MG TABS tablet TAKE 1 TABLET BY MOUTH TWICE  DAILY 07/06/24   Thukkani, Arun K, MD  BIOTIN PO Take 5,000 mcg by mouth in the morning.    [provider]  clobetasol  ointment (TEMOVATE ) 0.05 % Apply to the affected area in a thin layer twice a day for 2 weeks as needed for a flare.  Then apply to the area twice a week at bedtime as maintenance dosing. 05/27/23   Cathlyn JAYSON Nikki Bobie FORBES, MD  [employer] Sunscreen SPF 30 LOTN apply topically to sun exposed areas Daily; Duration: 30    [provider]  diclofenac  Sodium (VOLTAREN ) 1 % GEL Apply 2 g topically 4 (four) times daily as needed (back pain.).    [provider]  dicyclomine  (BENTYL ) 20 MG tablet Take 1 by mouth every 4-6 hours as needed for cramping 04/13/24   Abran Norleen SAILOR, MD  empagliflozin  (JARDIANCE ) 10 MG TABS tablet TAKE 1 TABLET BY MOUTH DAILY  BEFORE BREAKFAST 01/09/24   Lelon Hamilton T, PA-C  escitalopram  (LEXAPRO ) 10 MG tablet Take 1 tablet (10 mg total) by mouth daily. 11/18/23 11/17/24  Plotnikov, [name], MD  esomeprazole  (NEXIUM ) 40 MG capsule TAKE 1 CAPSULE BY  MOUTH EVERY DAY 06/29/24   Plotnikov, Aleksei V, MD  Estradiol  0.75 MG/1.25 GM (0.06%) topical gel 1 pump to skin to arm Transdermal Once a day; Duration: 30 day(s) Patient not taking: Reported on 07/13/2024    [provider]  fluorouracil (EFUDEX) 5 % cream 1 application (HOLD FOR SEVERE INFLAMMATION) Externally TO PRECANCERS once a day; Duration: 21 days 12/12/22   [provider]  gabapentin (NEURONTIN) 100 MG capsule Start with 1 capsule at night.  Advance to 1 capsule twice a day if tolerated for back pain 07/17/24   Plotnikov, Aleksei V, MD  Homeopathic Products (ARNICARE) GEL Apply 1 application  topically daily as needed (skin bruising).    [provider]  hyoscyamine  (LEVSIN ) 0.125 MG tablet TAKE 1-2 TABLETS EVERY 4 HOURS AS NEEDED FOR UP TO 10 DAYS FOR CRAMPING. 05/06/23   Plotnikov, [name], MD  LORazepam  (ATIVAN ) 1 MG tablet TAKE ONE TABLET BY MOUTH TWICE DAILY AS NEEDED FOR ANXIETY / SLEEP. 05/26/24   Plotnikov, [name], MD  losartan  (COZAAR ) 25 MG tablet Take 1 tablet (25 mg total) by mouth daily. 06/30/24   Thukkani, Arun K, MD  mupirocin  ointment (BACTROBAN ) 2 % On leg wound w/dressing change qd or bid 02/19/24   Plotnikov, Aleksei V, MD  nitroGLYCERIN  (NITROSTAT ) 0.4 MG SL tablet Place 1 tablet (0.4 mg total) under the tongue  every 5 (five) minutes as needed for chest pain (Call 911 at 3rd dose within 15 minutes.). 10/01/22   Plotnikov, Aleksei V, MD  NON FORMULARY Vitamin c, vitamin d     [provider]  ondansetron  (ZOFRAN -ODT) 8 MG disintegrating tablet dissolve 1 tablet (8 mg total) in mouth every 8 (eight) hours as needed for nausea or vomiting. 05/14/24   Plotnikov, Aleksei V, MD  Polyethyl Glycol-Propyl Glycol (LUBRICANT EYE DROPS) 0.4-0.3 % SOLN Place 1-2 drops into both eyes at bedtime.    [provider]  polyethylene glycol (MIRALAX  / GLYCOLAX ) 17 g packet Take 17 g by mouth as needed for mild constipation or moderate constipation.     [provider]  saccharomyces boulardii (FLORASTOR) 250 MG capsule Take 1 capsule (250 mg total) by mouth 2 (two) times daily. 06/03/24   Nandigam, Kavitha V, MD  sodium chloride  (OCEAN) 0.65 % SOLN nasal spray Place 1 spray into both nostrils at bedtime as needed for congestion.    [provider]  valACYclovir  (VALTREX ) 500 MG tablet Take 1 tablet (500 mg total) by mouth daily. Take one tablet by mouth daily for 3 days for an outbreak 07/13/24   Cathlyn JAYSON Nikki Bobie FORBES, MD    Allergies: Macrobid  [nitrofurantoin  monohyd macro], Meloxicam , Digoxin  and related, Diprolene  [betamethasone  dipropionate aug], Ferrous sulfate, Keflex  [cephalexin ], Lipitor  [atorvastatin ], Mobic  [meloxicam ], and Sulfamethoxazole -trimethoprim     Review of Systems  Gastrointestinal:  Positive for abdominal pain.    Updated Vital Signs BP 117/79   Pulse 72   Temp 98.1 F (36.7 C) (Oral)   Resp 17   Ht 5' 4 (1.626 m)   Wt 67.1 kg   SpO2 93%   BMI 25.39 kg/m   Physical Exam Vitals and nursing note reviewed.  Constitutional:      General: She is not in acute distress. HENT:     Head: Normocephalic and atraumatic.     Nose: Nose normal.  Eyes:     General: No scleral icterus. Cardiovascular:     Rate and Rhythm: Normal rate and regular rhythm.     Pulses: Normal pulses.     Heart sounds: Normal heart sounds.  Pulmonary:     Effort: Pulmonary effort is normal. No respiratory distress.     Breath sounds: No wheezing.  Abdominal:     Palpations: Abdomen is soft.     Tenderness: There is generalized abdominal tenderness.  Musculoskeletal:     Cervical back: Normal range of motion.     Right lower leg: No edema.     Left lower leg: No edema.  Skin:    General: Skin is warm and dry.     Capillary Refill: Capillary refill takes less than 2 seconds.  Neurological:     Mental Status: She is alert. Mental status is at baseline.  Psychiatric:        Mood and Affect: Mood normal.         Behavior: Behavior normal.     (all labs ordered are listed, but only abnormal results are displayed) Labs Reviewed  C DIFFICILE QUICK SCREEN W PCR REFLEX   - Abnormal; Notable for the following components:      Result Value   C Diff antigen POSITIVE (*)    All other components within normal limits  LIPASE, BLOOD - Abnormal; Notable for the following components:   Lipase 54 (*)    All other components within normal limits  COMPREHENSIVE METABOLIC PANEL WITH GFR - Abnormal; Notable  for the following components:   Glucose, Bld 103 (*)    BUN 27 (*)    Creatinine, Ser 1.02 (*)    GFR, Estimated 53 (*)    All other components within normal limits  CULTURE, BLOOD (ROUTINE X 2)  CULTURE, BLOOD (ROUTINE X 2)  GASTROINTESTINAL PANEL BY PCR, STOOL (REPLACES STOOL CULTURE)  CLOSTRIDIUM DIFFICILE BY PCR, REFLEXED  CBC  URINALYSIS, ROUTINE W REFLEX MICROSCOPIC  HEMOGLOBIN A1C  I-STAT CG4 LACTIC ACID, ED    EKG: EKG Interpretation Date/Time:  Sunday July 19 2024 07:34:25 EST Ventricular Rate:  57 PR Interval:    QRS Duration:  149 QT Interval:  505 QTC Calculation: 492 R Axis:   44  Text Interpretation: Atrial fibrillation Right bundle branch block No significant change since last tracing or earlier Confirmed by Zackowski, Scott [phone]) on 07/19/2024 9:17:31 AM  Radiology: CT ABDOMEN PELVIS W CONTRAST Result Date: 07/19/2024 EXAM: CT ABDOMEN AND PELVIS WITH CONTRAST 07/19/2024 10:36:15 AM TECHNIQUE: CT of the abdomen and pelvis was performed with the administration of 80 mL of iohexol  (OMNIPAQUE ) 300 MG/ML solution. Multiplanar reformatted images are provided for review. Automated exposure control, iterative reconstruction, and/or weight-based adjustment of the mA/kV was utilized to reduce the radiation dose to as low as reasonably achievable. COMPARISON: CT abdomen and pelvis 04/21/2024. CLINICAL HISTORY: 89 year old female with LLQ abdominal pain. FINDINGS: LOWER CHEST: Stable mild  cardiomegaly. No pericardial or pleural effusion. Small chronic Bochdalek hernia on the right is stable. Superimposed bilateral lung bases atelectasis and scarring is stable. LIVER: The liver is unremarkable. GALLBLADDER AND BILE DUCTS: Gallbladder appears more distended compared to 04/21/2024 but otherwise within normal limits. No biliary ductal dilatation. SPLEEN: No acute abnormality. PANCREAS: Pancreatic atrophy. ADRENAL GLANDS: No acute abnormality. KIDNEYS, URETERS AND BLADDER: Exophytic simple fluid density left renal upper pole cyst. Per consensus, no follow-up is needed for simple Bosniak type 1 and 2 renal cysts, unless the patient has a malignancy history or risk factors. Bilateral renal enhancement and contrast excretion within normal limits. Diminutive bladder. No stones in the kidneys or ureters. No hydronephrosis. No perinephric or periureteral stranding. GI AND BOWEL: Large bowel appears indistinct, mildly thickened and inflamed from the splenic flexure, throughout the descending colon, throughout the sigmoid colon and to the rectum. Pronounced sigmoid wall thickening up to 14 mm on series 2 image 52. Relatively mild associated mesenteric inflammatory stranding throughout those segments. Fairly widespread diverticulosis throughout those segments. Upstream transverse colon has a more normal appearance, is redundant. No right colon inflammation identified. Nondilated small bowel. Decompressed stomach and duodenum. Some right colon diverticulosis. Diminutive or absent appendix is not identified. There is no bowel obstruction. PERITONEUM AND RETROPERITONEUM: No ascites. No free air. VASCULATURE: Aorta is normal in caliber. Chronic tortuosity of the visible aorta. Moderately advanced calcified aortoiliac atherosclerosis. Major arterial structures and the portal venous system appear patent. LYMPH NODES: No lymphadenopathy. REPRODUCTIVE ORGANS: No acute abnormality. BONES AND SOFT TISSUES: Osteopenia.  Advanced lumbar spine degeneration superimposed on moderate thoracolumbar scoliosis. Chronic T10 compression fracture partially visible. No acute osseous abnormality. No focal soft tissue abnormality. IMPRESSION: 1. Widespread large bowel inflammation compatible with Acute colitis from the splenic flexure to the rectum. Pronounced sigmoid wall thickening (up to 14 mm). No obstruction, abscess, or other complicating features. 2. Advanced aortoiliac atherosclerosis and spine degeneration. Stable mild cardiomegaly. Electronically signed by: Helayne Hurst MD 07/19/2024 10:59 AM EST RP Workstation: [id number]   DG Chest 2 View Result Date: 07/19/2024 EXAM: 2 VIEW(S)  XRAY OF THE CHEST 07/19/2024 09:22:00 AM COMPARISON: 02/07/2023 CLINICAL HISTORY: SOB FINDINGS: LUNGS AND PLEURA: No focal pulmonary opacity. No pulmonary edema. No pleural effusion. No pneumothorax. HEART AND MEDIASTINUM: Calcified aorta. BONES AND SOFT TISSUES: Thoracic degenerative changes. Old left humeral neck fracture. IMPRESSION: 1. No acute cardiopulmonary process. Electronically signed by: Waddell Calk MD 07/19/2024 09:40 AM EST RP Workstation: [id number]     .Ultrasound ED Peripheral IV (Provider)  Date/Time: 07/19/2024 12:25 PM  Performed by: Neldon Hamp RAMAN, PA Authorized by: Neldon Hamp RAMAN, PA   Procedure details:    Indications: multiple failed IV attempts     Skin Prep: chlorhexidine  gluconate     Location:  Left AC   Angiocath:  20 G   Bedside Ultrasound Guided: Yes     Images: not archived     Patient tolerated procedure without complications: Yes     Dressing applied: Yes   .Critical Care  Performed by: Neldon Hamp RAMAN, PA Authorized by: Neldon Hamp RAMAN, PA   Critical care provider statement:    Critical care time (minutes):  35   Critical care time was exclusive of:  Separately billable procedures and treating other patients and teaching time   Critical care was necessary to treat or prevent imminent or  life-threatening deterioration of the following conditions:  Dehydration   Critical care was time spent personally by me on the following activities:  Development of treatment plan with patient or surrogate, review of old charts, re-evaluation of patient's condition, pulse oximetry, ordering and review of radiographic studies, ordering and review of laboratory studies, ordering and performing treatments and interventions, obtaining history from patient or surrogate, examination of patient and evaluation of patient's response to treatment   Care discussed with: admitting provider      Medications Ordered in the ED  escitalopram  (LEXAPRO ) tablet 10 mg (has no administration in time range)  LORazepam  (ATIVAN ) tablet 0.5 mg (has no administration in time range)  pantoprazole  (PROTONIX ) EC tablet 40 mg (has no administration in time range)  hyoscyamine  (LEVSIN ) tablet 0.125 mg (has no administration in time range)  saccharomyces boulardii (FLORASTOR) capsule 250 mg (has no administration in time range)  apixaban  (ELIQUIS ) tablet 2.5 mg (has no administration in time range)  gabapentin (NEURONTIN) capsule 100 mg (has no administration in time range)  0.9 %  sodium chloride  infusion (has no administration in time range)  acetaminophen  (TYLENOL ) tablet 650 mg (has no administration in time range)    Or  acetaminophen  (TYLENOL ) suppository 650 mg (has no administration in time range)  ondansetron  (ZOFRAN ) tablet 4 mg (has no administration in time range)    Or  ondansetron  (ZOFRAN ) injection 4 mg (has no administration in time range)  HYDROmorphone  (DILAUDID ) injection 0.5 mg (has no administration in time range)  insulin aspart (novoLOG) injection 0-9 Units ( Subcutaneous Not Given 07/19/24 1225)  vancomycin  (VANCOCIN ) capsule 125 mg (125 mg Oral Given 07/19/24 1227)  dicyclomine  (BENTYL ) capsule 20 mg (20 mg Oral Given 07/19/24 0809)  lactated ringers  bolus 1,000 mL (0 mLs Intravenous Stopped 07/19/24  0800)  ondansetron  (ZOFRAN ) injection 4 mg (4 mg Intravenous Given 07/19/24 0809)  lactated ringers  bolus 1,000 mL (0 mLs Intravenous Stopped 07/19/24 0915)  iohexol  (OMNIPAQUE ) 300 MG/ML solution 100 mL (80 mLs Intravenous Contrast Given 07/19/24 1025)    Clinical Course as of 07/19/24 1227  Sun Jul 19, 2024  0727 Pain started this AM and described as crampy 10/10 pain.  Just had BM. No vomiting but  was nauseated and dry heaved.  [WF]  1114 Discussed w Dr. Laurita who will admit.  [WF]    Clinical Course User Index [WF] Neldon Hamp RAMAN, PA                                 Medical Decision Making Amount and/or Complexity of Data Reviewed Labs: ordered. Radiology: ordered.  Risk Prescription drug management. Decision regarding hospitalization.   This patient presents to the ED for concern of abd pain, this involves a number of treatment options, and is a complaint that carries with it a high risk of complications and morbidity. A differential diagnosis was considered for the patient's symptoms which is discussed below:   The causes of generalized abdominal pain include but are not limited to AAA, mesenteric ischemia, appendicitis, diverticulitis, DKA, gastritis, gastroenteritis, AMI, nephrolithiasis, pancreatitis, peritonitis, adrenal insufficiency,lead poisoning, iron  toxicity, intestinal ischemia, constipation, UTI,SBO/LBO, splenic rupture, biliary disease, IBD, IBS, PUD, or hepatitis. Ectopic pregnancy, ovarian torsion, PID.    Co morbidities: Discussed in HPI   Brief History:    Patient is an 89 year old female with past medical history significant for C. difficile, hypertension, diverticulitis, CAD, HSV, renal insufficiency, A-fib, CKD, SVT, bowel obstructions  Abdominal pain started this AM and described as crampy 10/10 pain.  Just had BM. No vomiting but was nauseated and dry heaved.      EMR reviewed including pt PMHx, past surgical history and past visits to ER.    See HPI for more details   Lab Tests:   I personally reviewed all laboratory work and imaging. Metabolic panel without any acute abnormality specifically kidney function within normal limits and no significant electrolyte abnormalities. CBC without leukocytosis or significant anemia.   Imaging Studies:  Abnormal findings. I personally reviewed all imaging studies. Imaging notable for  1. Widespread large bowel inflammation compatible with Acute colitis from the  splenic flexure to the rectum. Pronounced sigmoid wall thickening (up to 14  mm). No obstruction, abscess, or other complicating features.  2. Advanced aortoiliac atherosclerosis and spine degeneration. Stable mild  cardiomegaly.    Cardiac Monitoring:  The patient was maintained on a cardiac monitor.  I personally viewed and interpreted the cardiac monitored which showed an underlying rhythm of: NSR EKG non-ischemic   Medicines ordered:  I ordered medication including lactated Ringer 's, Bentyl , Zofran  for abd pain. Vanc for c diff.  Reevaluation of the patient after these medicines showed that the patient improved I have reviewed the patients home medicines and have made adjustments as needed   Critical Interventions:  fluid resus    Consults/Attending Physician   I discussed this case with my attending physician who cosigned this note including patient's presenting symptoms, physical exam, and planned diagnostics and interventions. Attending physician stated agreement with plan or made changes to plan which were implemented.  Discussed with Dr. Laurita of hospitalist service who will admit.  Reevaluation:  After the interventions noted above I re-evaluated patient and found that they have :improved   Social Determinants of Health:      Problem List / ED Course:  Patient with history of C. difficile here with watery diarrhea and crampy abdominal pains this morning.  Was hypotensive soon after  arrival received 1 L of LR under pressure bag blood pressure improved from SBP 70-109 however became hypotensive again with BP in the 80s received another liter of LR with improvement in blood pressure.  Has  had some transient low blood pressures no crackles on exam she does have HFpEF however does not appear fluid overloaded and I believe that she required fluid resuscitation.  Admitted to hospitalist service Dr. Laurita.  Started on oral vancomycin .   Dispostion:  After consideration of the diagnostic results and the patients response to treatment, I feel that the patent would benefit from    Final diagnoses:  Left lower quadrant abdominal pain  C. difficile colitis  Hypotension, unspecified hypotension type    ED Discharge Orders     None          Neldon Hamp RAMAN, [place] 07/19/24 1227    Geraldene Hamilton, MD 07/21/24 1319

## 2024-07-19 NOTE — H&P (Addendum)
 History and Physical    Hailey [name] FMW:[id number] DOB: 1935/05/06 DOA: 07/19/2024  PCP: Garald [name], MD (Confirm with patient/family/NH records and if not entered, this has to be entered at [facility] point of entry) Patient coming from: Home  I have personally briefly reviewed patient's old medical records in [facility]  Chief Complaint: Abdominal pain, diarrhea  HPI: Hailey [name] is a 89 y.o. female with medical history significant of recurrent C. difficile colitis, PAF on Eliquis , HTN, IDDM, IBS, anxiety/depression presented with new onset of abdominal pain diarrhea.  Symptoms started suddenly this morning at 3 AM, patient woke up with 10/10 sharp like left lower quadrant abdominal pain, 1 hour later she passed large amount of watery diarrhea.  After passing diarrhea, her abdominal pain improved.  She felt nausea but no vomiting, denied any fever or chills.  She reported that she had very similar process of abdominal pain diarrhea September this year when she was diagnosed with recurrent C. difficile colitis was treated with p.o. vancomycin .  She did not take any antibiotics lately and she was started on gabapentin 2 days ago to address worsening of knee pain.  ED Course: Afebrile, nontachycardic blood pressure 108/70 O2 station 98% room air.  CT abdomen pelvis showed widespread large bowel inflammation compatible with acute colitis thickening of sigmoid wall 14 mm no obstruction abscess or complicating features.  Blood work showed WBC 6.9, hemoglobin 13.5, BUN 27 creatinine 1.0.  Stool studies showed positive C. difficile antigen negative for C. difficile toxin.  Review of Systems: As per HPI otherwise 14 point review of systems negative.    Past Medical History:  Diagnosis Date   Acute blood loss anemia 12/25/2017   Allergic rhinitis    Anxiety    Arthritis    my whole spine (07/01/2017)   Arthritis    Atrial fibrillation (HCC)    Bowel obstruction (HCC)    in  [place]    Bradycardia with 41-50 beats per minute 12/27/2017   Cancer (HCC)    Cellulitis of leg, right with large prepatella hematoma and open wounds 12/26/2017   CKD (chronic kidney disease) stage 2, GFR 60-89 ml/min    Colon polyps    Coronary artery disease    10/18 PCI/DES to p/m LCx with cutting balloon to mLcx   Diverticulosis of colon    GERD (gastroesophageal reflux disease)    Hip bursitis 2010   Dr Jacklin, Post op seroma   History of colon polyps    HSV (herpes simplex virus) anogenital infection 07/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   HTN (hypertension)    IBS (irritable bowel syndrome)    constipation predominant - Dr Luis   Lichen sclerosus    Osteopenia 11/2016   T score -2.0 FRAX 15%/4.3%   PAC (premature atrial contraction)    Symptomatiic   Renal insufficiency    Right bundle branch block (RBBB) on electrocardiogram (ECG) 12/27/2017   Scoliosis    SVT (supraventricular tachycardia)    brief history   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   biopsy showing vulvar atypia, possible VIN I    Past Surgical History:  Procedure Laterality Date   ANTERIOR AND POSTERIOR VAGINAL REPAIR  01/2002   thelbert 01/23/2011   APPENDECTOMY  1948   CARDIAC CATHETERIZATION  06/26/2017   CORONARY ANGIOPLASTY WITH STENT PLACEMENT  07/01/2017   CORONARY ATHERECTOMY N/A 07/01/2017   Procedure: CORONARY ATHERECTOMY;  Surgeon: Claudene Victory ORN, MD;  Location:  [facility] INVASIVE CV LAB;  Service: Cardiovascular;  Laterality: N/A;   CORONARY STENT INTERVENTION N/A 07/01/2017   Procedure: CORONARY STENT INTERVENTION;  Surgeon: Claudene Victory ORN, MD;  Location: [facility] INVASIVE CV LAB;  Service: Cardiovascular;  Laterality: N/A;   HAMMER TOE SURGERY     HEMORRHOID BANDING     HIP SURGERY Left 04/2009   hip examination under anesthesia followed by greater trochanteric bursectomy; iliotibial band tenotomy/notes 01/20/2011   I & D EXTREMITY Right 01/10/2018   Procedure: IRRIGATION AND  DEBRIDEMENT RIGHT KNEE, APPLY WOUND VAC;  Surgeon: Harden Jerona GAILS, MD;  Location: [facility] OR;  Service: Orthopedics;  Laterality: Right;   KNEE BURSECTOMY Right 04/2009   thelbert 01/09/2011   LEFT ATRIAL APPENDAGE OCCLUSION N/A 08/24/2021   Procedure: LEFT ATRIAL APPENDAGE OCCLUSION;  Surgeon: Wonda Sharper, MD;  Location: [facility] INVASIVE CV LAB;  Service: Cardiovascular;  Laterality: N/A;   LEFT HEART CATH AND CORONARY ANGIOGRAPHY N/A 06/26/2017   Procedure: LEFT HEART CATH AND CORONARY ANGIOGRAPHY;  Surgeon: Claudene Victory ORN, MD;  Location: [facility] INVASIVE CV LAB;  Service: Cardiovascular;  Laterality: N/A;   PUBOVAGINAL SLING  01/2002   thelbert 01/23/2011   REDUCTION MAMMAPLASTY     TEE WITHOUT CARDIOVERSION N/A 08/24/2021   Procedure: TRANSESOPHAGEAL ECHOCARDIOGRAM (TEE);  Surgeon: Wonda Sharper, MD;  Location: [facility] INVASIVE CV LAB;  Service: Cardiovascular;  Laterality: N/A;   TEMPORARY PACEMAKER N/A 07/01/2017   Procedure: TEMPORARY PACEMAKER;  Surgeon: Claudene Victory ORN, MD;  Location: [facility] INVASIVE CV LAB;  Service: Cardiovascular;  Laterality: N/A;   VAGINAL HYSTERECTOMY  01/2002   Vaginal hysterectomy, bilateral salpingo-oophorectomy/notes 01/23/2011     reports that she has quit smoking. Her smoking use included cigarettes. She has never used smokeless tobacco. She reports current alcohol use. She reports that she does not use drugs.  Allergies  Allergen Reactions   Macrobid  [Nitrofurantoin  Monohyd Macro] Other (See Comments)    Nausea, stomach cramps, fatigue , headache.   Meloxicam  Other (See Comments)    Jittery and headache   Digoxin  And Related Other (See Comments)    headaches   Diprolene  [Betamethasone  Dipropionate Aug] Other (See Comments)    Patient states that the bruising was made worse when using this   Ferrous Sulfate Other (See Comments)    Bad constipation    Keflex  [Cephalexin ] Nausea And Vomiting    Nausea, fatigue, and headache.   Lipitor  [Atorvastatin ]     Abd pain   Mobic   [Meloxicam ] Other (See Comments)    Unsure of reaction type   Sulfamethoxazole -Trimethoprim  Nausea Only    Family History  Problem Relation Age of Onset   Colon cancer Mother        Dx age 60, died at age 15   Diabetes Father    Prostate cancer Father    Prostate cancer Brother    Pancreatic cancer Brother    Stomach cancer Son     Prior to Admission medications   Medication Sig Start Date End Date Taking? Authorizing Provider  acetaminophen  (TYLENOL ) 500 MG tablet Take 1,000 mg by mouth every 6 (six) hours as needed (body aches / sore hip). Maximum of 6 tablets daily (3000mg )    [provider]  apixaban  (ELIQUIS ) 2.5 MG TABS tablet TAKE 1 TABLET BY MOUTH TWICE  DAILY 07/06/24   Thukkani, Arun K, MD  BIOTIN PO Take 5,000 mcg by mouth in the morning.    [provider]  clobetasol  ointment (TEMOVATE ) 0.05 % Apply to the affected area  in a thin layer twice a day for 2 weeks as needed for a flare.  Then apply to the area twice a week at bedtime as maintenance dosing. 05/27/23   Cathlyn JAYSON Nikki Bobie FORBES, MD  [employer] Sunscreen SPF 30 LOTN apply topically to sun exposed areas Daily; Duration: 30    [provider]  diclofenac  Sodium (VOLTAREN ) 1 % GEL Apply 2 g topically 4 (four) times daily as needed (back pain.).    [provider]  dicyclomine  (BENTYL ) 20 MG tablet Take 1 by mouth every 4-6 hours as needed for cramping 04/13/24   Abran Norleen SAILOR, MD  empagliflozin  (JARDIANCE ) 10 MG TABS tablet TAKE 1 TABLET BY MOUTH DAILY  BEFORE BREAKFAST 01/09/24   Lelon Hamilton T, PA-C  escitalopram  (LEXAPRO ) 10 MG tablet Take 1 tablet (10 mg total) by mouth daily. 11/18/23 11/17/24  Plotnikov, [name], MD  esomeprazole  (NEXIUM ) 40 MG capsule TAKE 1 CAPSULE BY MOUTH EVERY DAY 06/29/24   Plotnikov, Aleksei V, MD  Estradiol  0.75 MG/1.25 GM (0.06%) topical gel 1 pump to skin to arm Transdermal Once a day; Duration: 30 day(s) Patient not taking: Reported on 07/13/2024    [provider]  fluorouracil (EFUDEX) 5 % cream 1 application (HOLD FOR SEVERE INFLAMMATION) Externally TO PRECANCERS once a day; Duration: 21 days 12/12/22   [provider]  gabapentin (NEURONTIN) 100 MG capsule Start with 1 capsule at night.  Advance to 1 capsule twice a day if tolerated for back pain 07/17/24   Plotnikov, Aleksei V, MD  Homeopathic Products (ARNICARE) GEL Apply 1 application  topically daily as needed (skin bruising).    [provider]  hyoscyamine  (LEVSIN ) 0.125 MG tablet TAKE 1-2 TABLETS EVERY 4 HOURS AS NEEDED FOR UP TO 10 DAYS FOR CRAMPING. 05/06/23   Plotnikov, [name], MD  LORazepam  (ATIVAN ) 1 MG tablet TAKE ONE TABLET BY MOUTH TWICE DAILY AS NEEDED FOR ANXIETY / SLEEP. 05/26/24   Plotnikov, [name], MD  losartan  (COZAAR ) 25 MG tablet Take 1 tablet (25 mg total) by mouth daily. 06/30/24   Thukkani, Arun K, MD  mupirocin  ointment (BACTROBAN ) 2 % On leg wound w/dressing change qd or bid 02/19/24   Plotnikov, Aleksei V, MD  nitroGLYCERIN  (NITROSTAT ) 0.4 MG SL tablet Place 1 tablet (0.4 mg total) under the tongue every 5 (five) minutes as needed for chest pain (Call 911 at 3rd dose within 15 minutes.). 10/01/22   Plotnikov, Aleksei V, MD  NON FORMULARY Vitamin c, vitamin d     [provider]  ondansetron  (ZOFRAN -ODT) 8 MG disintegrating tablet dissolve 1 tablet (8 mg total) in mouth every 8 (eight) hours as needed for nausea or vomiting. 05/14/24   Plotnikov, Aleksei V, MD  Polyethyl Glycol-Propyl Glycol (LUBRICANT EYE DROPS) 0.4-0.3 % SOLN Place 1-2 drops into both eyes at bedtime.    [provider]  polyethylene glycol (MIRALAX  / GLYCOLAX ) 17 g packet Take 17 g by mouth as needed for mild constipation or moderate constipation.    [provider]  saccharomyces boulardii (FLORASTOR) 250 MG capsule Take 1 capsule (250 mg total) by mouth 2 (two) times daily. 06/03/24   Nandigam, Kavitha V, MD  sodium chloride  (OCEAN) 0.65 % SOLN nasal spray  Place 1 spray into both nostrils at bedtime as needed for congestion.    [provider]  valACYclovir  (VALTREX ) 500 MG tablet Take 1 tablet (500 mg total) by mouth daily. Take one tablet by mouth daily for 3 days for  an outbreak 07/13/24   Cathlyn JAYSON Nikki Bobie FORBES, MD    Physical Exam: Vitals:   07/19/24 1000 07/19/24 1010 07/19/24 1045 07/19/24 1100  BP: 114/84 118/79 108/70 117/79  Pulse: 63 72    Resp: 17 14 19 17   Temp:      TempSrc:      SpO2: 98% 98%  93%    Constitutional: NAD, calm, comfortable Vitals:   07/19/24 1000 07/19/24 1010 07/19/24 1045 07/19/24 1100  BP: 114/84 118/79 108/70 117/79  Pulse: 63 72    Resp: 17 14 19 17   Temp:      TempSrc:      SpO2: 98% 98%  93%   Eyes: PERRL, lids and conjunctivae normal ENMT: Mucous membranes are moist. Posterior pharynx clear of any exudate or lesions.Normal dentition.  Neck: normal, supple, no masses, no thyromegaly Respiratory: clear to auscultation bilaterally, no wheezing, no crackles. Normal respiratory effort. No accessory muscle use.  Cardiovascular: Regular rate and rhythm, no murmurs / rubs / gallops. No extremity edema. 2+ pedal pulses. No carotid bruits.  Abdomen: Tenderness on left aspect of abdomen, no rebound or guarding, no masses palpated. No hepatosplenomegaly. Bowel sounds positive.  Musculoskeletal: no clubbing / cyanosis. No joint deformity upper and lower extremities. Good ROM, no contractures. Normal muscle tone.  Skin: no rashes, lesions, ulcers. No induration Neurologic: CN 2-12 grossly intact. Sensation intact, DTR normal. Strength 5/5 in all 4.  Psychiatric: Normal judgment and insight. Alert and oriented x 3. Normal mood.     Labs on Admission: I have personally reviewed following labs and imaging studies  CBC: Recent Labs  Lab 07/19/24 0740  WBC 6.7  HGB 13.5  HCT 42.0  MCV 99.5  PLT 203   Basic Metabolic Panel: Recent Labs  Lab 07/19/24 0740  NA 139  K 4.1  CL 104  CO2  24  GLUCOSE 103*  BUN 27*  CREATININE 1.02*  CALCIUM  9.6   GFR: CrCl cannot be calculated (Unknown ideal weight.). Liver Function Tests: Recent Labs  Lab 07/19/24 0740  AST 21  ALT 7  ALKPHOS 77  BILITOT 0.3  PROT 6.7  ALBUMIN 4.2   Recent Labs  Lab 07/19/24 0740  LIPASE 54*   No results for input(s): AMMONIA in the last 168 hours. Coagulation Profile: No results for input(s): INR, PROTIME in the last 168 hours. Cardiac Enzymes: No results for input(s): CKTOTAL, CKMB, CKMBINDEX, TROPONINI in the last 168 hours. BNP (last 3 results) No results for input(s): PROBNP in the last 8760 hours. HbA1C: No results for input(s): HGBA1C in the last 72 hours. CBG: No results for input(s): GLUCAP in the last 168 hours. Lipid Profile: No results for input(s): CHOL, HDL, LDLCALC, TRIG, CHOLHDL, LDLDIRECT in the last 72 hours. Thyroid  Function Tests: No results for input(s): TSH, T4TOTAL, FREET4, T3FREE, THYROIDAB in the last 72 hours. Anemia Panel: No results for input(s): VITAMINB12, FOLATE, FERRITIN, TIBC, IRON , RETICCTPCT in the last 72 hours. Urine analysis:    Component Value Date/Time   COLORURINE YELLOW 07/13/2024 1705   APPEARANCEUR CLEAR 07/13/2024 1705   LABSPEC 1.022 07/13/2024 1705   PHURINE 5.5 07/13/2024 1705   GLUCOSEU 3+ (A) 07/13/2024 1705   GLUCOSEU >=1000 (A) 05/21/2024 1148   HGBUR NEGATIVE 07/13/2024 1705   BILIRUBINUR NEGATIVE 05/21/2024 1148   KETONESUR NEGATIVE 07/13/2024 1705   PROTEINUR TRACE (A) 07/13/2024 1705   UROBILINOGEN 0.2 05/21/2024 1148   NITRITE NEGATIVE 05/21/2024 1148   LEUKOCYTESUR TRACE (A) 05/21/2024 1148  Radiological Exams on Admission: CT ABDOMEN PELVIS W CONTRAST Result Date: 07/19/2024 EXAM: CT ABDOMEN AND PELVIS WITH CONTRAST 07/19/2024 10:36:15 AM TECHNIQUE: CT of the abdomen and pelvis was performed with the administration of 80 mL of iohexol  (OMNIPAQUE ) 300 MG/ML  solution. Multiplanar reformatted images are provided for review. Automated exposure control, iterative reconstruction, and/or weight-based adjustment of the mA/kV was utilized to reduce the radiation dose to as low as reasonably achievable. COMPARISON: CT abdomen and pelvis 04/21/2024. CLINICAL HISTORY: 89 year old female with LLQ abdominal pain. FINDINGS: LOWER CHEST: Stable mild cardiomegaly. No pericardial or pleural effusion. Small chronic Bochdalek hernia on the right is stable. Superimposed bilateral lung bases atelectasis and scarring is stable. LIVER: The liver is unremarkable. GALLBLADDER AND BILE DUCTS: Gallbladder appears more distended compared to 04/21/2024 but otherwise within normal limits. No biliary ductal dilatation. SPLEEN: No acute abnormality. PANCREAS: Pancreatic atrophy. ADRENAL GLANDS: No acute abnormality. KIDNEYS, URETERS AND BLADDER: Exophytic simple fluid density left renal upper pole cyst. Per consensus, no follow-up is needed for simple Bosniak type 1 and 2 renal cysts, unless the patient has a malignancy history or risk factors. Bilateral renal enhancement and contrast excretion within normal limits. Diminutive bladder. No stones in the kidneys or ureters. No hydronephrosis. No perinephric or periureteral stranding. GI AND BOWEL: Large bowel appears indistinct, mildly thickened and inflamed from the splenic flexure, throughout the descending colon, throughout the sigmoid colon and to the rectum. Pronounced sigmoid wall thickening up to 14 mm on series 2 image 52. Relatively mild associated mesenteric inflammatory stranding throughout those segments. Fairly widespread diverticulosis throughout those segments. Upstream transverse colon has a more normal appearance, is redundant. No right colon inflammation identified. Nondilated small bowel. Decompressed stomach and duodenum. Some right colon diverticulosis. Diminutive or absent appendix is not identified. There is no bowel obstruction.  PERITONEUM AND RETROPERITONEUM: No ascites. No free air. VASCULATURE: Aorta is normal in caliber. Chronic tortuosity of the visible aorta. Moderately advanced calcified aortoiliac atherosclerosis. Major arterial structures and the portal venous system appear patent. LYMPH NODES: No lymphadenopathy. REPRODUCTIVE ORGANS: No acute abnormality. BONES AND SOFT TISSUES: Osteopenia. Advanced lumbar spine degeneration superimposed on moderate thoracolumbar scoliosis. Chronic T10 compression fracture partially visible. No acute osseous abnormality. No focal soft tissue abnormality. IMPRESSION: 1. Widespread large bowel inflammation compatible with Acute colitis from the splenic flexure to the rectum. Pronounced sigmoid wall thickening (up to 14 mm). No obstruction, abscess, or other complicating features. 2. Advanced aortoiliac atherosclerosis and spine degeneration. Stable mild cardiomegaly. Electronically signed by: Helayne Hurst MD 07/19/2024 10:59 AM EST RP Workstation: [id number]   DG Chest 2 View Result Date: 07/19/2024 EXAM: 2 VIEW(S) XRAY OF THE CHEST 07/19/2024 09:22:00 AM COMPARISON: 02/07/2023 CLINICAL HISTORY: SOB FINDINGS: LUNGS AND PLEURA: No focal pulmonary opacity. No pulmonary edema. No pleural effusion. No pneumothorax. HEART AND MEDIASTINUM: Calcified aorta. BONES AND SOFT TISSUES: Thoracic degenerative changes. Old left humeral neck fracture. IMPRESSION: 1. No acute cardiopulmonary process. Electronically signed by: Waddell Calk MD 07/19/2024 09:40 AM EST RP Workstation: [id number]    EKG: Independently reviewed.  Sinus bradycardia, no acute ST changes.  Assessment/Plan Principal Problem:   Colitis  (please populate well all problems here in Problem List. (For example, if patient is on BP meds at home and you resume or decide to hold them, it is a problem that needs to be her. Same for CAD, COPD, HLD and so on)  Acute colitis Rule out recurrent C. difficile colitis -Case was discussed with  on-call [facility]  PA, who reviewed patient case, and recommended we initiated p.o. vancomycin  treatment while waiting for C. difficile PCR.  GI is against IV antibiotics to treat non-C. diff colitis.  GI agreed with routine consultation on this patient. -CT abd reviewed, no signs of toxic megacolon at this point. - Supportive care with as needed Dilaudid  for pain, as needed Zofran  - IV fluid x 1 day - Continue probiotics  HTN - BP borderline low, monitor off home BP meds  PAF - In sinus rhythm, continue Eliquis   IIDM -SSI  Deconditioning - PT evaluation   DVT prophylaxis: Eliquis  Code Status: Full code Family Communication: Husband at bedside Disposition Plan: Patient sick with recurrent C. difficile colitis, requiring close monitoring clinical progress, expect more than 2 midnight hospital stay. Consults called: GI Admission status: MedSurg admission   Cort ONEIDA Mana MD [facility] Pager [phone]  07/19/2024, 11:29 AM

## 2024-07-19 NOTE — Progress Notes (Signed)
 Hailey [name] has had 2 biopsies las week at Dr. Shawnie office. One on her left thigh and on her right anterior hand. She has Band-Aid over both sites. She states having 3 episodes of diarrhea since coming to the hospital. She hasn't had any diarrhea since coming to the floor. Appetite is good for dinner. Denies any nausea.

## 2024-07-19 NOTE — ED Triage Notes (Signed)
 Pt bib ems due to abdominal pain.Normal bowel movement yesterday. Tried this morning, not successful. States that it feels like diverticulitis flare up.  130/80 60 HR 14 RR 98% O2 RA

## 2024-07-19 NOTE — Progress Notes (Signed)
Read chart

## 2024-07-19 NOTE — Plan of Care (Signed)
  Problem: Education: Goal: Ability to describe self-care measures that may prevent or decrease complications (Diabetes Survival Skills Education) will improve Outcome: Progressing   Problem: Coping: Goal: Ability to adjust to condition or change in health will improve Outcome: Progressing   Problem: Fluid Volume: Goal: Ability to maintain a balanced intake and output will improve Outcome: Progressing   Problem: Health Behavior/Discharge Planning: Goal: Ability to manage health-related needs will improve Outcome: Progressing   Problem: Metabolic: Goal: Ability to maintain appropriate glucose levels will improve Outcome: Progressing   Problem: Nutritional: Goal: Maintenance of adequate nutrition will improve Outcome: Progressing   Problem: Skin Integrity: Goal: Risk for impaired skin integrity will decrease Outcome: Progressing   Problem: Clinical Measurements: Goal: Ability to maintain clinical measurements within normal limits will improve Outcome: Progressing Goal: Diagnostic test results will improve Outcome: Progressing   Problem: Activity: Goal: Risk for activity intolerance will decrease Outcome: Progressing   Problem: Nutrition: Goal: Adequate nutrition will be maintained Outcome: Progressing   Problem: Elimination: Goal: Will not experience complications related to bowel motility Outcome: Progressing   Problem: Safety: Goal: Ability to remain free from injury will improve Outcome: Progressing

## 2024-07-20 ENCOUNTER — Other Ambulatory Visit (HOSPITAL_COMMUNITY): Payer: Self-pay

## 2024-07-20 ENCOUNTER — Telehealth (HOSPITAL_COMMUNITY): Payer: Self-pay | Admitting: Pharmacy Technician

## 2024-07-20 DIAGNOSIS — A0472 Enterocolitis due to Clostridium difficile, not specified as recurrent: Secondary | ICD-10-CM | POA: Diagnosis not present

## 2024-07-20 DIAGNOSIS — K529 Noninfective gastroenteritis and colitis, unspecified: Secondary | ICD-10-CM | POA: Diagnosis not present

## 2024-07-20 LAB — URINALYSIS, ROUTINE W REFLEX MICROSCOPIC
Bacteria, UA: NONE SEEN
Bilirubin Urine: NEGATIVE
Glucose, UA: >=500 mg/dL — AB
Hgb urine dipstick: NEGATIVE
Ketones, ur: NEGATIVE mg/dL
Nitrite: NEGATIVE
Protein, ur: NEGATIVE mg/dL
Specific Gravity, Urine: 1.033 — ABNORMAL HIGH (ref 1.005–1.030)
pH: 5 (ref 5.0–8.0)

## 2024-07-20 LAB — RETICULOCYTES
Immature Retic Fract: 13 % (ref 2.3–15.9)
RBC.: 3.32 MIL/uL — ABNORMAL LOW (ref 3.87–5.11)
Retic Count, Absolute: 72.7 K/uL (ref 19.0–186.0)
Retic Ct Pct: 2.2 % (ref 0.4–3.1)

## 2024-07-20 LAB — BASIC METABOLIC PANEL WITH GFR
Anion gap: 9 (ref 5–15)
BUN: 19 mg/dL (ref 8–23)
CO2: 22 mmol/L (ref 22–32)
Calcium: 8.6 mg/dL — ABNORMAL LOW (ref 8.9–10.3)
Chloride: 105 mmol/L (ref 98–111)
Creatinine, Ser: 0.94 mg/dL (ref 0.44–1.00)
GFR, Estimated: 59 mL/min — ABNORMAL LOW (ref >60)
Glucose, Bld: 105 mg/dL — ABNORMAL HIGH (ref 70–99)
Potassium: 4.3 mmol/L (ref 3.5–5.1)
Sodium: 136 mmol/L (ref 135–145)

## 2024-07-20 LAB — CBC
HCT: 34.4 % — ABNORMAL LOW (ref 36.0–46.0)
Hemoglobin: 10.8 g/dL — ABNORMAL LOW (ref 12.0–15.0)
MCH: 32.1 pg (ref 26.0–34.0)
MCHC: 31.4 g/dL (ref 30.0–36.0)
MCV: 102.4 fL — ABNORMAL HIGH (ref 80.0–100.0)
Platelets: 153 10*3/uL (ref 150–400)
RBC: 3.36 MIL/uL — ABNORMAL LOW (ref 3.87–5.11)
RDW: 14.7 % (ref 11.5–15.5)
WBC: 6.9 10*3/uL (ref 4.0–10.5)
nRBC: 0 % (ref 0.0–0.2)

## 2024-07-20 LAB — GLUCOSE, CAPILLARY
Glucose-Capillary: 84 mg/dL (ref 70–99)
Glucose-Capillary: 79 mg/dL (ref 70–99)
Glucose-Capillary: 92 mg/dL (ref 70–99)

## 2024-07-20 LAB — FERRITIN: Ferritin: 146 ng/mL (ref 11–307)

## 2024-07-20 LAB — IRON AND TIBC
Iron: 24 ug/dL — ABNORMAL LOW (ref 28–170)
Saturation Ratios: 10 % — ABNORMAL LOW (ref 10.4–31.8)
TIBC: 234 ug/dL — ABNORMAL LOW (ref 250–450)
UIBC: 210 ug/dL

## 2024-07-20 LAB — VITAMIN B12: Vitamin B-12: 540 pg/mL (ref 180–914)

## 2024-07-20 LAB — FOLATE: Folate: 19.2 ng/mL (ref >5.9)

## 2024-07-20 MED ORDER — VANCOMYCIN HCL 125 MG PO CAPS: 125.0000 mg | ORAL_CAPSULE | ORAL | Status: DC

## 2024-07-20 MED ORDER — SALINE SPRAY 0.65 % NA SOLN
1.0000 | NASAL | Status: DC | PRN
  Administered 2024-07-20: 1 via NASAL
  Filled 2024-07-20: qty 44

## 2024-07-20 MED ORDER — VANCOMYCIN HCL 125 MG PO CAPS: 125.0000 mg | ORAL_CAPSULE | Freq: Two times a day (BID) | ORAL | Status: DC

## 2024-07-20 MED ORDER — VANCOMYCIN HCL 125 MG PO CAPS: 125.0000 mg | ORAL_CAPSULE | Freq: Four times a day (QID) | ORAL | Status: DC

## 2024-07-20 MED ORDER — VANCOMYCIN HCL 125 MG PO CAPS: 125.0000 mg | ORAL_CAPSULE | Freq: Every day | ORAL | Status: DC

## 2024-07-20 MED ORDER — LORAZEPAM 1 MG PO TABS
1.0000 mg | ORAL_TABLET | Freq: Once | ORAL | Status: AC
  Administered 2024-07-20: 1 mg via ORAL
  Filled 2024-07-20: qty 1

## 2024-07-20 NOTE — Evaluation (Signed)
 Physical Therapy Evaluation Patient Details Name: Hailey [name] MRN: [id number] DOB: 1935/03/24 Today's Date: 07/20/2024  History of Present Illness  89 year old presenting with new onset  LLQ abdominal pain and watery diarrhea, and admitted with C. difficile colitis. F with PMH of recurrent C. difficile, paroxysmal A-fib on Eliquis , IDDM-2, HTN, IBS, anxiety and depression  Clinical Impression  Patient evaluated by Physical Therapy with no further acute PT needs identified. All education has been completed and the patient has no further questions.  Pt amb 160' without device, mildly unsteady gait but no overt LOB with balance challenges. educated on slowing speed of movement for fall prevention; pt and spouse state that moving too quickly is the cause of most of her falls; pt has been getting up ad lib in the room;  Pt and spouse report she is at or near her baseline. No further PT needs at this time.   See below for any follow-up Physical Therapy or equipment needs. PT is signing off. Thank you for this referral.         If plan is discharge home, recommend the following: Assist for transportation;Help with stairs or ramp for entrance   Can travel by private vehicle        Equipment Recommendations None recommended by PT  Recommendations for Other Services       Functional Status Assessment Patient has not had a recent decline in their functional status     Precautions / Restrictions Precautions Precautions: Fall Restrictions Weight Bearing Restrictions Per Provider Order: No      Mobility  Bed Mobility               General bed mobility comments: pt up in room on PT arrival;    Transfers Overall transfer level: Independent Equipment used: None                    Ambulation/Gait Ambulation/Gait assistance: Supervision Gait Distance (Feet): 160 Feet Assistive device: None Gait Pattern/deviations: Step-through pattern, Decreased dorsiflexion -  right       General Gait Details: positions RLE in external rotation. mildly unsteady gait however no assist given and no overt LOB. educated on slowing speed of movement, pt states movign too quickly has caused most of her falls (spouse confirms)  Acupuncturist Bed    Modified Rankin (Stroke Patients Only)       Balance Overall balance assessment: Needs assistance Sitting-balance support: Feet supported, No upper extremity supported Sitting balance-Leahy Scale: Good     Standing balance support: No upper extremity supported, During functional activity   Standing balance comment: Fair to Good, NT to mod challenges             High level balance activites: Side stepping, Direction changes, Turns, Head turns High Level Balance Comments: close supervision, no overt LOB             Pertinent Vitals/Pain Pain Assessment Pain Assessment: Faces Faces Pain Scale: Hurts a little bit Pain Location: sciatic pain LEs Pain Descriptors / Indicators: Grimacing Pain Intervention(s): Monitored during session    Home Living Family/patient expects to be discharged to:: Private residence Living Arrangements: Spouse/significant other Available Help at Discharge: Family;Available 24 hours/day Type of Home: House Home Access: Stairs to enter Entrance Stairs-Rails: None Entrance Stairs-Number of Steps: 1 threhsold Alternate Level Stairs-Number of Steps: flight Home Layout: Two level;Able to live on  main level with bedroom/bathroom Home Equipment: Rolling Walker (2 wheels);Cane - single point;BSC/3in1;Shower seat      Prior Function Prior Level of Function : Independent/Modified Independent             Mobility Comments: amb wtih RW, cane or no device; pt reports multiple falls ADLs Comments: independent     Extremity/Trunk Assessment        Lower Extremity Assessment Lower Extremity Assessment: Overall WFL for tasks  assessed    Cervical / Trunk Assessment Cervical / Trunk Assessment:  (scoliosis/lateral trunk shift)  Communication   Communication Communication: No apparent difficulties    Cognition Arousal: Alert Behavior During Therapy: WFL for tasks assessed/performed   PT - Cognitive impairments: No apparent impairments                         Following commands: Intact       Cueing Cueing Techniques: Verbal cues     General Comments      Exercises     Assessment/Plan    PT Assessment Patient does not need any further PT services  PT Problem List         PT Treatment Interventions      PT Goals (Current goals can be found in the Care Plan section)  Acute Rehab PT Goals PT Goal Formulation: All assessment and education complete, DC therapy    Frequency       Co-evaluation               AM-PAC PT 6 Clicks Mobility  Outcome Measure Help needed turning from your back to your side while in a flat bed without using bedrails?: None Help needed moving from lying on your back to sitting on the side of a flat bed without using bedrails?: None Help needed moving to and from a bed to a chair (including a wheelchair)?: None Help needed standing up from a chair using your arms (e.g., wheelchair or bedside chair)?: None Help needed to walk in hospital room?: None Help needed climbing 3-5 steps with a railing? : A Little 6 Click Score: 23    End of Session   Activity Tolerance: Patient tolerated treatment well Patient left: with family/visitor present;Other (comment) (bathroom, NT aware) Nurse Communication: Mobility status PT Visit Diagnosis: Other abnormalities of gait and mobility (R26.89)    Time: 8390-8380 PT Time Calculation (min) (ACUTE ONLY): 10 min   Charges:   PT Evaluation $PT Eval Low Complexity: 1 Low   PT General Charges $$ ACUTE PT VISIT: 1 Visit         Ysabella Babiarz, PT  Acute Rehab Dept [facility])  [phone]  07/20/2024   [facility] 07/20/2024, 5:00 PM

## 2024-07-20 NOTE — Progress Notes (Signed)
 PROGRESS NOTE  Hailey [name] FMW:[id number] DOB: Jun 25, 1935   PCP: Garald [name], MD  Patient is from: Home. Lives with husband.  Independently ambulates at baseline.  DOA: 07/19/2024 LOS: 1  Chief complaints Chief Complaint  Patient presents with   Abdominal Pain     Brief Narrative / Interim history: 89 year old F with PMH of recurrent C. difficile, paroxysmal A-fib on Eliquis , IDDM-2, HTN, IBS, anxiety and depression resenting with new onset sev LLQ abdominal pain and watery diarrhea, and admitted with C. difficile colitis.  In ED, afebrile without leukocytosis.  C. difficile testing discordant.  Reflex C. difficile PCR pending.  CT abdomen and pelvis with widespread large bowel inflammation compatible with acute colitis from the splenic flexure to the rectum.  Patient was started on p.o. vancomycin  after discussion with GI.    Of note, patient was treated for C. difficile in 10/2023 at 05/2024.  The next day, C. difficile PCR positive for toxigenic C. difficile with little to no toxin production.  Continued to have diarrhea.  GIP negative.  Blood cultures NGTD.  ID consulted  Subjective: Seen and examined earlier this morning.  No major events overnight or this morning.  Reports 2 watery bowel movements last night and this morning.  She has formed bowel movements later this morning.  Also had worsening abdominal pain after diarrhea later this morning.  Denies nausea or vomiting.   Assessment and plan: Recurrent C. difficile colitis: Previously treated for C. difficile in 10/2023 at 05/2024.  C. difficile testing and CT abdomen and pelvis as above.  Still with significant abdominal pain and tenderness.  Diarrhea seems to have improved.  She had formed stool later this morning. -Continue p.o. vancomycin -she may need a slow taper given recurrence.  Will have ID input -Follow GI recommendation -Continue analgesics. -Continue IV fluid -Continue enteric precaution  Paroxysmal  A-fib: In sinus rhythm. - Continue home Eliquis .   Essential hypertension: Normotensive for most part -Hold home losartan .  NIDDM-2: A1c 5.1%.  CBG within acceptable range.  On Jardiance  at home. -Discontinue CBG monitoring and SSI.  Microcytic anemia: Hgb down 2 g from baseline.  No overt bleeding.  Denies blood in stool -Check anemia panel - Recheck in the morning  IBS -Continue home Levsin   Neuropathy - Continue home gabapentin  Mood disorder: Stable. -Continue home meds  Body mass index is 25.39 kg/m.           DVT prophylaxis:  apixaban  (ELIQUIS ) tablet 2.5 mg Start: 07/19/24 1445 apixaban  (ELIQUIS ) tablet 2.5 mg  Code Status: Full code Family Communication: None at bedside Level of care: Med-Surg Status is: Inpatient Remains inpatient appropriate because: Recurrent C. difficile colitis   Final disposition: Home   55 minutes with more than 50% spent in reviewing records, counseling patient/family and coordinating care.  Consultants:  Gastroenterology Infectious disease  Procedures: None  Microbiology summarized: Blood cultures NGTD GIP negative C. difficile testing as above.  Objective: Vitals:   07/19/24 1258 07/19/24 1322 07/19/24 2120 07/20/24 0438  BP: 108/77  118/81 (!) 141/84  Pulse: 70  91 69  Resp: 15  18 18   Temp: 98 F (36.7 C)  98.6 F (37 C) 98 F (36.7 C)  TempSrc: Oral  Oral Oral  SpO2: 98%  93% 95%  Weight:  67.1 kg    Height:  5' 4 (1.626 m)      Examination:  GENERAL: No apparent distress.  Nontoxic. HEENT: MMM.  Vision and hearing grossly intact.  NECK:  Supple.  No apparent JVD.  RESP:  No IWOB.  Fair aeration bilaterally. CVS:  RRR. Heart sounds normal.  ABD/GI/GU: BS+. Abd soft.  LLQ tenderness.  No rebound or guarding. MSK/EXT:  Moves extremities. No apparent deformity. No edema.  SKIN: no apparent skin lesion or wound NEURO: AA.  Oriented appropriately.  No apparent focal neuro deficit. PSYCH: Calm. Normal  affect.   Sch Meds:  Scheduled Meds:  apixaban   2.5 mg Oral BID   escitalopram   10 mg Oral QHS   gabapentin  100 mg Oral QHS   insulin aspart  0-9 Units Subcutaneous TID WC   pantoprazole   40 mg Oral Daily   saccharomyces boulardii  250 mg Oral BID   vancomycin   125 mg Oral QID   Continuous Infusions:  sodium chloride  100 mL/hr at 07/20/24 0813   PRN Meds:.acetaminophen  **OR** acetaminophen , HYDROmorphone  (DILAUDID ) injection, hyoscyamine , LORazepam , ondansetron  **OR** ondansetron  (ZOFRAN ) IV  Antimicrobials: Anti-infectives (From admission, onward)    Start     Dose/Rate Route Frequency Ordered Stop   07/19/24 1230  vancomycin  (VANCOCIN ) capsule 125 mg        125 mg Oral 4 times daily 07/19/24 1210 07/29/24 1359   07/19/24 1130  ciprofloxacin  (CIPRO ) IVPB 400 mg  Status:  Discontinued        400 mg 200 mL/hr over 60 Minutes Intravenous Every 12 hours 07/19/24 1129 07/19/24 1132   07/19/24 1130  metroNIDAZOLE  (FLAGYL ) IVPB 500 mg  Status:  Discontinued        500 mg 100 mL/hr over 60 Minutes Intravenous Every 12 hours 07/19/24 1129 07/19/24 1132        I have personally reviewed the following labs and images: CBC: Recent Labs  Lab 07/19/24 0740 07/20/24 0518  WBC 6.7 6.9  HGB 13.5 10.8*  HCT 42.0 34.4*  MCV 99.5 102.4*  PLT 203 153   BMP &GFR Recent Labs  Lab 07/19/24 0740 07/20/24 0518  NA 139 136  K 4.1 4.3  CL 104 105  CO2 24 22  GLUCOSE 103* 105*  BUN 27* 19  CREATININE 1.02* 0.94  CALCIUM  9.6 8.6*   Estimated Creatinine Clearance: 39.7 mL/min (by C-G formula based on SCr of 0.94 mg/dL). Liver & Pancreas: Recent Labs  Lab 07/19/24 0740  AST 21  ALT 7  ALKPHOS 77  BILITOT 0.3  PROT 6.7  ALBUMIN 4.2   Recent Labs  Lab 07/19/24 0740  LIPASE 54*   No results for input(s): AMMONIA in the last 168 hours. Diabetic: Recent Labs    07/19/24 0740  HGBA1C 5.1   Recent Labs  Lab 07/19/24 1224 07/19/24 1823 07/20/24 0254 07/20/24 0804   GLUCAP 85 137* 84 79   Cardiac Enzymes: No results for input(s): CKTOTAL, CKMB, CKMBINDEX, TROPONINI in the last 168 hours. No results for input(s): PROBNP in the last 8760 hours. Coagulation Profile: No results for input(s): INR, PROTIME in the last 168 hours. Thyroid  Function Tests: No results for input(s): TSH, T4TOTAL, FREET4, T3FREE, THYROIDAB in the last 72 hours. Lipid Profile: No results for input(s): CHOL, HDL, LDLCALC, TRIG, CHOLHDL, LDLDIRECT in the last 72 hours. Anemia Panel: No results for input(s): VITAMINB12, FOLATE, FERRITIN, TIBC, IRON , RETICCTPCT in the last 72 hours. Urine analysis:    Component Value Date/Time   COLORURINE YELLOW 07/20/2024 0429   APPEARANCEUR CLEAR 07/20/2024 0429   LABSPEC 1.033 (H) 07/20/2024 0429   PHURINE 5.0 07/20/2024 0429   GLUCOSEU >=500 (A) 07/20/2024 0429   GLUCOSEU >=1000 (A)  05/21/2024 1148   HGBUR NEGATIVE 07/20/2024 0429   BILIRUBINUR NEGATIVE 07/20/2024 0429   KETONESUR NEGATIVE 07/20/2024 0429   PROTEINUR NEGATIVE 07/20/2024 0429   UROBILINOGEN 0.2 05/21/2024 1148   NITRITE NEGATIVE 07/20/2024 0429   LEUKOCYTESUR SMALL (A) 07/20/2024 0429   Sepsis Labs: Invalid input(s): PROCALCITONIN, LACTICIDVEN  Microbiology: Recent Results (from the past 240 hours)  Urine Culture     Status: None   Collection Time: 07/13/24  5:05 PM  Result Value Ref Range Status   MICRO NUMBER: [id number]  Final   SPECIMEN QUALITY: Adequate  Final   Sample Source URINE  Final   STATUS: FINAL  Final   Result:   Final    Less than 10,000 CFU/mL of single Gram positive organism isolated. No further testing will be performed. If clinically indicated, recollection using a method to minimize contamination, with prompt transfer to Urine Culture Transport Tube, is recommended.  C Difficile Quick Screen w PCR reflex     Status: Abnormal   Collection Time: 07/19/24  7:39 AM   Specimen: STOOL  Result  Value Ref Range Status   C Diff antigen POSITIVE (A) NEGATIVE Final   C Diff toxin NEGATIVE NEGATIVE Final   C Diff interpretation Results are indeterminate. See PCR results.  Final    Comment: Performed at [facility], [address] [place], [place] [address]  C. Diff by PCR, Reflexed     Status: Abnormal   Collection Time: 07/19/24  7:39 AM  Result Value Ref Range Status   Toxigenic C. Difficile by PCR POSITIVE (A) NEGATIVE Final    Comment: Positive for toxigenic C. difficile with little to no toxin production. Only treat if clinical presentation suggests symptomatic illness.   Hypervirulent Strain PRESUMPTIVE NEGATIVE PRESUMPTIVE NEGATIVE Final    Comment: Performed at [facility], [address], [place], [place] [address]  Blood culture (routine x 2)     Status: None (Preliminary result)   Collection Time: 07/19/24  7:40 AM   Specimen: BLOOD  Result Value Ref Range Status   Specimen Description   Final    BLOOD RIGHT ANTECUBITAL Performed at [facility], [address] [place], [place] [address]    Special Requests   Final    BOTTLES DRAWN AEROBIC AND ANAEROBIC Blood Culture adequate volume Performed at [facility], [address], [place], [place] [address]    Culture   Final    NO GROWTH < 24 HOURS Performed at [facility], [address], [place], [place] [address]    Report Status PENDING  Incomplete  Blood culture (routine x 2)     Status: None (Preliminary result)   Collection Time: 07/19/24  8:06 AM   Specimen: BLOOD  Result Value Ref Range Status   Specimen Description   Final    BLOOD LEFT ANTECUBITAL Performed at [facility], [address], [place], [place] [address]    Special Requests   Final    BOTTLES DRAWN AEROBIC AND ANAEROBIC Blood Culture adequate volume Performed at [facility], [address], [place], [place] [address]    Culture    Final    NO GROWTH < 24 HOURS Performed at [facility], [address], [place], [place] [address]    Report Status PENDING  Incomplete  Gastrointestinal Panel by PCR , Stool     Status: None   Collection Time: 07/19/24  9:55 AM  Specimen: Stool  Result Value Ref Range Status   Campylobacter species NOT DETECTED NOT DETECTED Final   Plesimonas shigelloides NOT DETECTED NOT DETECTED Final   Salmonella species NOT DETECTED NOT DETECTED Final   Yersinia enterocolitica NOT DETECTED NOT DETECTED Final   Vibrio species NOT DETECTED NOT DETECTED Final   Vibrio cholerae NOT DETECTED NOT DETECTED Final   Enteroaggregative E coli (EAEC) NOT DETECTED NOT DETECTED Final   Enteropathogenic E coli (EPEC) NOT DETECTED NOT DETECTED Final   Enterotoxigenic E coli (ETEC) NOT DETECTED NOT DETECTED Final   Shiga like toxin producing E coli (STEC) NOT DETECTED NOT DETECTED Final   Shigella/Enteroinvasive E coli (EIEC) NOT DETECTED NOT DETECTED Final   Cryptosporidium NOT DETECTED NOT DETECTED Final   Cyclospora cayetanensis NOT DETECTED NOT DETECTED Final   Entamoeba histolytica NOT DETECTED NOT DETECTED Final   Giardia lamblia NOT DETECTED NOT DETECTED Final   Adenovirus F40/41 NOT DETECTED NOT DETECTED Final   Astrovirus NOT DETECTED NOT DETECTED Final   Norovirus GI/GII NOT DETECTED NOT DETECTED Final   Rotavirus A NOT DETECTED NOT DETECTED Final   Sapovirus (I, II, IV, and V) NOT DETECTED NOT DETECTED Final    Comment: Performed at [facility], [address] [place], [place] [address]    Radiology Studies: No results found.    Manuelito Poage T. Britne Borelli [facility]  If 7PM-7AM, please contact night-coverage [email or web address] 07/20/2024, 10:37 AM

## 2024-07-20 NOTE — Consult Note (Signed)
 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 GIP + for Campylobacter spp, EPEC, ETEC,  10/14/23 appears to have been treated with 14 days course of PO fidaxomicin . GI panel positive for Cdiff toxin A/B, Salmonella + 05/26/24 appear to have been treated with 10 days of PO Vancomycin . GIP positive for C diff toxin A/B 07/19/24 C diff ag +, toxin negative, toxigenic PCR +, GIP negative  - He c diff tests have always tested positive for antigen but negative for toxin. So difficult to interpret if truly C diff. She however reports prior diarrhea has improved with C diff tx.  - CT with  Widespread large bowel inflammation compatible with Acute colitis from the splenic flexure to the rectum. Pronounced sigmoid wall thickening (up to 14). No obstruction, abscess, or other complicating features.  # H/o IBS # Thrombocytopenia   Recommendations  - continue PO Vancomycin  125 mg po qid for today. If continues to improve, can consider discharge on  prolonged PO Vancomycin  tapered dose.  - monitor for improvement in diarrhea  - maintain enteric precautions  D/w primary team   Rest of the management as per the primary team. Please call with questions or concerns.  Thank you for the consult  __________________________________________________________________________________________________________ HPI and Hospital  Course: 89 year old female with prior history as below including PAF on AC, HTN, IDDM, IBS, anxiety/depression, HSV infection, recurrent C. difficile diarrhea who presented to the ED on 11/9 with new onset abdominal pain, diarrhea.  Symptoms started day of ED presentation early a.m. when she started sudden abdominal cramps followed by loose stools, not very watery and came to ED.  Denied fever, chills or vomiting but had nausea.  No recent antibiotic use.  She reports her bowel movement was normal a day prior to ED arrival.  She reports diarrhea improved with past treatment course of C. difficile in February and September 2025.   She had several bowel movements yesterday, where she could not wait until to reach the restroom.  Does not seem to be fully watery.  She had 1 bowel movement this a.m. followed by mucosy stool when she went to restroom due to urgency.   At ED afebrile Labs with AKI creatinine 1.02, lipase 54, WBC 6.7 UA with greater than 500 glucose, small leukocytes, no bacteria, 6-10 RBC, 21-50 WBC  CT  IMPRESSION: 1. Widespread large bowel inflammation compatible with Acute colitis from the splenic flexure to the rectum. Pronounced sigmoid wall thickening (up to 14 mm). No obstruction, abscess, or other complicating features. 2. Advanced aortoiliac atherosclerosis and spine degeneration. Stable mild  cardiomegaly.  ROS: General- Denies fever, chills, loss of appetite and loss of weight HEENT - Denies headache, blurry vision, neck pain, sinus pain Chest - Denies any chest pain, SOB or cough CVS- Denies any dizziness/lightheadedness, syncopal attacks, palpitations Abdomen- Denies any nausea, vomiting, abdominal pain, hematochezia Neuro - Denies any weakness, numbness, tingling sensation Psych - Denies any changes in mood irritability or depressive symptoms GU- Denies any burning, dysuria, hematuria or increased frequency of urination Skin - denies any rashes/lesions MSK - denies  any joint pain/swelling or restricted ROM    Past Medical History:  Diagnosis Date   Acute blood loss anemia 12/25/2017   Allergic rhinitis    Anxiety    Arthritis    my whole spine (07/01/2017)   Arthritis    Atrial fibrillation (HCC)    Bowel obstruction (HCC)    in [place]    Bradycardia with 41-50 beats per minute 12/27/2017   Cancer (HCC)    Cellulitis of leg, right with large prepatella hematoma and open wounds 12/26/2017   CKD (chronic kidney disease) stage 2, GFR 60-89 ml/min    Colon polyps    Coronary artery disease    10/18 PCI/DES to p/m LCx with cutting balloon to mLcx   Diverticulosis of colon    GERD (gastroesophageal reflux disease)    Hip bursitis 2010   Dr Jacklin, Post op seroma   History of colon polyps    HSV (herpes simplex virus) anogenital infection 07/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   HTN (hypertension)    IBS (irritable bowel syndrome)    constipation predominant - Dr Luis   Lichen sclerosus    Osteopenia 11/2016   T score -2.0 FRAX 15%/4.3%   PAC (premature atrial contraction)    Symptomatiic   Renal insufficiency    Right bundle branch block (RBBB) on electrocardiogram (ECG) 12/27/2017   Scoliosis    SVT (supraventricular tachycardia)    brief history   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   biopsy showing vulvar atypia, possible VIN I   Past Surgical History:  Procedure Laterality Date   ANTERIOR AND POSTERIOR VAGINAL REPAIR  01/2002   thelbert 01/23/2011   APPENDECTOMY  1948   CARDIAC CATHETERIZATION  06/26/2017   CORONARY ANGIOPLASTY WITH STENT PLACEMENT  07/01/2017   CORONARY ATHERECTOMY N/A 07/01/2017   Procedure: CORONARY ATHERECTOMY;  Surgeon: Claudene Victory ORN, MD;  Location: [facility] INVASIVE CV LAB;  Service: Cardiovascular;  Laterality: N/A;   CORONARY STENT INTERVENTION N/A 07/01/2017   Procedure: CORONARY STENT INTERVENTION;  Surgeon: Claudene Victory ORN, MD;  Location: [facility]ASIVE CV [facility];  Service: Cardiovascular;   Laterality: N/A;   HAMMER TOE SURGERY     HEMORRHOID BANDING     HIP SURGERY Left 04/2009   hip examination under anesthesia followed by greater trochanteric bursectomy; iliotibial band tenotomy/notes 01/20/2011   I & D EXTREMITY Right 01/10/2018   Procedure: IRRIGATION AND DEBRIDEMENT RIGHT KNEE, APPLY WOUND VAC;  Surgeon: Harden Jerona GAILS, MD;  Location: [facility] OR;  Service: Orthopedics;  Laterality: Right;   KNEE BURSECTOMY Right 04/2009   thelbert 01/09/2011   LEFT ATRIAL APPENDAGE OCCLUSION N/A 08/24/2021   Procedure: LEFT ATRIAL APPENDAGE OCCLUSION;  Surgeon: Wonda Sharper, MD;  Location: [facility] INVASIVE CV LAB;  Service: Cardiovascular;  Laterality: N/A;   LEFT HEART CATH AND CORONARY ANGIOGRAPHY N/A 06/26/2017   Procedure: LEFT HEART CATH AND CORONARY ANGIOGRAPHY;  Surgeon: Claudene Victory ORN, MD;  Location: [facility];  Service:  Cardiovascular;  Laterality: N/A;   PUBOVAGINAL SLING  01/2002   thelbert 01/23/2011   REDUCTION MAMMAPLASTY     TEE WITHOUT CARDIOVERSION N/A 08/24/2021   Procedure: TRANSESOPHAGEAL ECHOCARDIOGRAM (TEE);  Surgeon: Wonda Sharper, MD;  Location: [facility] INVASIVE CV LAB;  Service: Cardiovascular;  Laterality: N/A;   TEMPORARY PACEMAKER N/A 07/01/2017   Procedure: TEMPORARY PACEMAKER;  Surgeon: Claudene Victory ORN, MD;  Location: [facility] INVASIVE CV LAB;  Service: Cardiovascular;  Laterality: N/A;   VAGINAL HYSTERECTOMY  01/2002   Vaginal hysterectomy, bilateral salpingo-oophorectomy/notes 01/23/2011    Scheduled Meds:  apixaban   2.5 mg Oral BID   escitalopram   10 mg Oral QHS   gabapentin  100 mg Oral QHS   pantoprazole   40 mg Oral Daily   saccharomyces boulardii  250 mg Oral BID   vancomycin   125 mg Oral QID   Continuous Infusions: PRN Meds:.acetaminophen  **OR** acetaminophen , HYDROmorphone  (DILAUDID ) injection, hyoscyamine , LORazepam , ondansetron  **OR** ondansetron  (ZOFRAN ) IV, sodium chloride   Allergies  Allergen Reactions   Macrobid  [Nitrofurantoin  Monohyd Macro] Other  (See Comments)    Nausea, stomach cramps, fatigue , headache.   Meloxicam  Other (See Comments)    Jittery and headache   Digoxin  And Related Other (See Comments)    headaches   Diprolene  [Betamethasone  Dipropionate Aug] Other (See Comments)    Patient states that the bruising was made worse when using this   Ferrous Sulfate Other (See Comments)    Bad constipation    Keflex  [Cephalexin ] Nausea And Vomiting    Nausea, fatigue, and headache.   Lipitor  [Atorvastatin ]     Abd pain   Mobic  [Meloxicam ] Other (See Comments)    Unsure of reaction type   Sulfamethoxazole -Trimethoprim  Nausea Only   Family History  Problem Relation Age of Onset   Colon cancer Mother        Dx age 71, died at age 75   Diabetes Father    Prostate cancer Father    Prostate cancer Brother    Pancreatic cancer Brother    Stomach cancer Son     Vitals BP (!) 118/93 (BP Location: Left Arm)   Pulse 71   Temp 98.6 F (37 C) (Oral)   Resp 17   Ht 5' 4 (1.626 m)   Wt 67.1 kg   SpO2 100%   BMI 25.39 kg/m    Physical Exam Constitutional: Elderly female sitting in the bed, not in acute distress    Comments: HEENT WNL  Cardiovascular:     Rate and Rhythm: Normal rate     Heart sounds: S1 and S2  Pulmonary:     Effort: Pulmonary effort is normal.     Comments: Normal breath sounds  Abdominal:     Palpations: Abdomen is soft.     Tenderness: Nondistended and, mild tenderness in the left lower quadrant, no rebound tenderness or guarding  Musculoskeletal:        General: No swelling or tenderness in peripheral joints  Skin:    Comments: No rashes  Neurological:     General: Awake, alert and oriented, grossly nonfocal  Psychiatric:        Mood and Affect: Mood normal.   Pertinent Microbiology Results for orders placed or performed during the hospital encounter of 07/19/24  C Difficile Quick Screen w PCR reflex     Status: Abnormal   Collection Time: 07/19/24  7:39 AM   Specimen: STOOL   Result Value Ref Range Status   C Diff antigen POSITIVE (A) NEGATIVE Final  C Diff toxin NEGATIVE NEGATIVE Final   C Diff interpretation Results are indeterminate. See PCR results.  Final    Comment: Performed at [facility], [address], [place], [place] [address]  C. Diff by PCR, Reflexed     Status: Abnormal   Collection Time: 07/19/24  7:39 AM  Result Value Ref Range Status   Toxigenic C. Difficile by PCR POSITIVE (A) NEGATIVE Final    Comment: Positive for toxigenic C. difficile with little to no toxin production. Only treat if clinical presentation suggests symptomatic illness.   Hypervirulent Strain PRESUMPTIVE NEGATIVE PRESUMPTIVE NEGATIVE Final    Comment: Performed at [facility], [address], [place], [place] [address]  Blood culture (routine x 2)     Status: None (Preliminary result)   Collection Time: 07/19/24  7:40 AM   Specimen: BLOOD  Result Value Ref Range Status   Specimen Description   Final    BLOOD RIGHT ANTECUBITAL Performed at [facility], [address] [place], [place] [address]    Special Requests   Final    BOTTLES DRAWN AEROBIC AND ANAEROBIC Blood Culture adequate volume Performed at [facility], [address] [place], [place] [address]    Culture   Final    NO GROWTH < 24 HOURS Performed at [facility], [address], [place], [place] [address]    Report Status PENDING  Incomplete  Blood culture (routine x 2)     Status: None (Preliminary result)   Collection Time: 07/19/24  8:06 AM   Specimen: BLOOD  Result Value Ref Range Status   Specimen Description   Final    BLOOD LEFT ANTECUBITAL Performed at [facility], [address] [place], [place] [address]    Special Requests   Final    BOTTLES DRAWN AEROBIC AND ANAEROBIC Blood Culture adequate volume Performed at [facility], [address] [place], [place] [address]     Culture   Final    NO GROWTH < 24 HOURS Performed at [facility], [address], [place], [place] [address]    Report Status PENDING  Incomplete  Gastrointestinal Panel by PCR , Stool     Status: None   Collection Time: 07/19/24  9:55 AM   Specimen: Stool  Result Value Ref Range Status   Campylobacter species NOT DETECTED NOT DETECTED Final   Plesimonas shigelloides NOT DETECTED NOT DETECTED Final   Salmonella species NOT DETECTED NOT DETECTED Final   Yersinia enterocolitica NOT DETECTED NOT DETECTED Final   Vibrio species NOT DETECTED NOT DETECTED Final   Vibrio cholerae NOT DETECTED NOT DETECTED Final   Enteroaggregative E coli (EAEC) NOT DETECTED NOT DETECTED Final   Enteropathogenic E coli (EPEC) NOT DETECTED NOT DETECTED Final   Enterotoxigenic E coli (ETEC) NOT DETECTED NOT DETECTED Final   Shiga like toxin producing E coli (STEC) NOT DETECTED NOT DETECTED Final   Shigella/Enteroinvasive E coli (EIEC) NOT DETECTED NOT DETECTED Final   Cryptosporidium NOT DETECTED NOT DETECTED Final   Cyclospora cayetanensis NOT DETECTED NOT DETECTED Final   Entamoeba histolytica NOT DETECTED NOT DETECTED Final   Giardia lamblia NOT DETECTED NOT DETECTED Final   Adenovirus F40/41 NOT DETECTED NOT DETECTED Final   Astrovirus NOT DETECTED NOT DETECTED Final   Norovirus GI/GII NOT DETECTED NOT DETECTED Final   Rotavirus A NOT DETECTED NOT DETECTED Final   Sapovirus (I, II, IV, and V) NOT DETECTED  NOT DETECTED Final    Comment: Performed at [facility], [address] [place], [place] [address]   *Note: Due to a large number of results and/or encounters for the requested time period, some results have not been displayed. A complete set of results can be found in Results Review.   Pertinent Lab seen by me:    Latest Ref Rng & Units 07/21/2024    5:29 AM 07/20/2024    5:18 AM 07/19/2024    7:40 AM  CBC  WBC 4.0 - 10.5 K/uL 5.2  6.9  6.7   Hemoglobin 12.0 - 15.0 g/dL 89.5   89.1  86.4   Hematocrit 36.0 - 46.0 % 33.0  34.4  42.0   Platelets 150 - 400 K/uL 143  153  203       Latest Ref Rng & Units 07/21/2024    5:29 AM 07/20/2024    5:18 AM 07/19/2024    7:40 AM  CMP  Glucose 70 - 99 mg/dL 78  894  896   BUN 8 - 23 mg/dL 14  19  27    Creatinine 0.44 - 1.00 mg/dL 9.08  9.05  8.97   Sodium 135 - 145 mmol/L 138  136  139   Potassium 3.5 - 5.1 mmol/L 4.4  4.3  4.1   Chloride 98 - 111 mmol/L 107  105  104   CO2 22 - 32 mmol/L 22  22  24    Calcium  8.9 - 10.3 mg/dL 8.8  8.6  9.6   Total Protein 6.5 - 8.1 g/dL   6.7   Total Bilirubin 0.0 - 1.2 mg/dL   0.3   Alkaline Phos 38 - 126 U/L   77   AST 15 - 41 U/L   21   ALT 0 - 44 U/L   7    Pertinent Imagings/Other Imagings Plain films and CT images have been personally visualized and interpreted; radiology reports have been reviewed. Decision making incorporated into the Impression / Recommendations.  CT ABDOMEN PELVIS W CONTRAST Result Date: 07/19/2024 EXAM: CT ABDOMEN AND PELVIS WITH CONTRAST 07/19/2024 10:36:15 AM TECHNIQUE: CT of the abdomen and pelvis was performed with the administration of 80 mL of iohexol  (OMNIPAQUE ) 300 MG/ML solution. Multiplanar reformatted images are provided for review. Automated exposure control, iterative reconstruction, and/or weight-based adjustment of the mA/kV was utilized to reduce the radiation dose to as low as reasonably achievable. COMPARISON: CT abdomen and pelvis 04/21/2024. CLINICAL HISTORY: 89 year old female with LLQ abdominal pain. FINDINGS: LOWER CHEST: Stable mild cardiomegaly. No pericardial or pleural effusion. Small chronic Bochdalek hernia on the right is stable. Superimposed bilateral lung bases atelectasis and scarring is stable. LIVER: The liver is unremarkable. GALLBLADDER AND BILE DUCTS: Gallbladder appears more distended compared to 04/21/2024 but otherwise within normal limits. No biliary ductal dilatation. SPLEEN: No acute abnormality. PANCREAS: Pancreatic  atrophy. ADRENAL GLANDS: No acute abnormality. KIDNEYS, URETERS AND BLADDER: Exophytic simple fluid density left renal upper pole cyst. Per consensus, no follow-up is needed for simple Bosniak type 1 and 2 renal cysts, unless the patient has a malignancy history or risk factors. Bilateral renal enhancement and contrast excretion within normal limits. Diminutive bladder. No stones in the kidneys or ureters. No hydronephrosis. No perinephric or periureteral stranding. GI AND BOWEL: Large bowel appears indistinct, mildly thickened and inflamed from the splenic flexure, throughout the descending colon, throughout the sigmoid colon and to the rectum. Pronounced sigmoid wall thickening up to 14 mm on series 2  image 52. Relatively mild associated mesenteric inflammatory stranding throughout those segments. Fairly widespread diverticulosis throughout those segments. Upstream transverse colon has a more normal appearance, is redundant. No right colon inflammation identified. Nondilated small bowel. Decompressed stomach and duodenum. Some right colon diverticulosis. Diminutive or absent appendix is not identified. There is no bowel obstruction. PERITONEUM AND RETROPERITONEUM: No ascites. No free air. VASCULATURE: Aorta is normal in caliber. Chronic tortuosity of the visible aorta. Moderately advanced calcified aortoiliac atherosclerosis. Major arterial structures and the portal venous system appear patent. LYMPH NODES: No lymphadenopathy. REPRODUCTIVE ORGANS: No acute abnormality. BONES AND SOFT TISSUES: Osteopenia. Advanced lumbar spine degeneration superimposed on moderate thoracolumbar scoliosis. Chronic T10 compression fracture partially visible. No acute osseous abnormality. No focal soft tissue abnormality. IMPRESSION: 1. Widespread large bowel inflammation compatible with Acute colitis from the splenic flexure to the rectum. Pronounced sigmoid wall thickening (up to 14 mm). No obstruction, abscess, or other  complicating features. 2. Advanced aortoiliac atherosclerosis and spine degeneration. Stable mild cardiomegaly. Electronically signed by: Helayne Hurst MD 07/19/2024 10:59 AM EST RP Workstation: [id number]   DG Chest 2 View Result Date: 07/19/2024 EXAM: 2 VIEW(S) XRAY OF THE CHEST 07/19/2024 09:22:00 AM COMPARISON: 02/07/2023 CLINICAL HISTORY: SOB FINDINGS: LUNGS AND PLEURA: No focal pulmonary opacity. No pulmonary edema. No pleural effusion. No pneumothorax. HEART AND MEDIASTINUM: Calcified aorta. BONES AND SOFT TISSUES: Thoracic degenerative changes. Old left humeral neck fracture. IMPRESSION: 1. No acute cardiopulmonary process. Electronically signed by: Waddell Calk MD 07/19/2024 09:40 AM EST RP Workstation: [id number]   XR C-ARM NO REPORT Result Date: 06/25/2024 Please see Notes tab for imaging impression.  Epidural Steroid injection Result Date: 06/25/2024 Eldonna Novel, MD     06/25/2024  9:03 AM Lumbar Epidural Steroid Injection - Interlaminar Approach with Fluoroscopic Guidance Patient: JERMIYAH RICOTTA     Date of Birth: 26-Oct-1934 MRN: [id number] PCP: Garald [name], MD     Visit Date: 06/25/2024  Universal Protocol:   Consent Given By: the patient Position: PRONE Additional Comments: Vital signs were monitored before and after the procedure. Patient was prepped and draped in the usual sterile fashion. The correct patient, procedure, and site was verified. Injection Procedure Details: Procedure diagnoses: Lumbar radiculopathy [M54.16] Meds Administered: Meds ordered this encounter Medications  methylPREDNISolone  acetate (DEPO-MEDROL ) injection 40 mg  Laterality: Right Location/Site:  L3-4 Needle: 3.5 in., 20 ga. Tuohy Needle Placement: Paramedian epidural Findings:  -Comments: Excellent flow of contrast into the epidural space. Procedure Details: Using a paramedian approach from the side mentioned above, the region overlying the inferior lamina was localized under fluoroscopic  visualization and the soft tissues overlying this structure were infiltrated with 4 ml. of 1% Lidocaine  without Epinephrine . The Tuohy needle was inserted into the epidural space using a paramedian approach. The epidural space was localized using loss of resistance along with counter oblique bi-planar fluoroscopic views.  After negative aspirate for air, blood, and CSF, a 2 ml. volume of Isovue -250 was injected into the epidural space and the flow of contrast was observed. Radiographs were obtained for documentation purposes.  The injectate was administered into the level noted above. Additional Comments: The patient tolerated the procedure well Dressing: 2 x 2 sterile gauze and Band-Aid  Post-procedure details: Patient was observed during the procedure. Post-procedure instructions were reviewed. Patient left the clinic in stable condition.   I spent 85 minutes involved in face-to-face and non-face-to-face activities for this patient on the day of the visit. Professional time spent includes the following activities: Preparing to see  the patient (review of tests), Obtaining and reviewing separately obtained history (EDP, H&P, hospitalist progress note), Performing a medically appropriate examination and evaluation , Ordering medications/labs, referring and communicating with other health care professionals, Documenting clinical information in the EMR, Independently interpreting results (not separately reported), Communicating results to the patient/family member,  counseling and educating the patient/family member and Care coordination (not separately reported).  Electronically signed by:   Plan d/w requesting provider as well as ID pharm D  Of note, portions of this note may have been created with voice recognition software. While this note has been edited for accuracy, occasional wrong-word or 'sound-a-like' substitutions may have occurred due to the inherent limitations of voice recognition software.    Annalee Orem, MD Infectious Disease Physician [facility] Pager: [phone]

## 2024-07-20 NOTE — Plan of Care (Signed)
  Problem: Nutritional: Goal: Maintenance of adequate nutrition will improve Outcome: Progressing   Problem: Clinical Measurements: Goal: Will remain free from infection Outcome: Progressing

## 2024-07-20 NOTE — Telephone Encounter (Signed)
 Patient Product/process Development Scientist completed.    The patient is insured through [facility]. Patient has Medicare and is not eligible for a copay card, but may be able to apply for patient assistance or Medicare RX Payment Plan (Patient Must reach out to their plan, if eligible for payment plan), if available.    Ran test claim for vancomycin  125 mg and the current 10 day co-pay is $0.00.  Ran test claim for Dificid  200 mg and the current 10 day co-pay is $0.00.  This test claim was processed through [facility]- copay amounts may vary at other pharmacies due to pharmacy/plan contracts, or as the patient moves through the different stages of their insurance plan.     Reyes Sharps, CPHT Pharmacy Technician Patient Advocate Specialist Lead [facility] Patient Advocate Team Direct Number: [phone]  Fax: [phone]

## 2024-07-21 ENCOUNTER — Other Ambulatory Visit (HOSPITAL_COMMUNITY): Payer: Self-pay

## 2024-07-21 DIAGNOSIS — K529 Noninfective gastroenteritis and colitis, unspecified: Secondary | ICD-10-CM | POA: Diagnosis not present

## 2024-07-21 DIAGNOSIS — A0471 Enterocolitis due to Clostridium difficile, recurrent: Secondary | ICD-10-CM | POA: Diagnosis not present

## 2024-07-21 LAB — CBC
HCT: 33 % — ABNORMAL LOW (ref 36.0–46.0)
Hemoglobin: 10.4 g/dL — ABNORMAL LOW (ref 12.0–15.0)
MCH: 32.1 pg (ref 26.0–34.0)
MCHC: 31.5 g/dL (ref 30.0–36.0)
MCV: 101.9 fL — ABNORMAL HIGH (ref 80.0–100.0)
Platelets: 143 K/uL — ABNORMAL LOW (ref 150–400)
RBC: 3.24 MIL/uL — ABNORMAL LOW (ref 3.87–5.11)
RDW: 14.6 % (ref 11.5–15.5)
WBC: 5.2 K/uL (ref 4.0–10.5)
nRBC: 0 % (ref 0.0–0.2)

## 2024-07-21 LAB — RENAL FUNCTION PANEL
Albumin: 3.3 g/dL — ABNORMAL LOW (ref 3.5–5.0)
Anion gap: 8 (ref 5–15)
BUN: 14 mg/dL (ref 8–23)
CO2: 22 mmol/L (ref 22–32)
Calcium: 8.8 mg/dL — ABNORMAL LOW (ref 8.9–10.3)
Chloride: 107 mmol/L (ref 98–111)
Creatinine, Ser: 0.91 mg/dL (ref 0.44–1.00)
GFR, Estimated: >60 mL/min (ref >60)
Glucose, Bld: 78 mg/dL (ref 70–99)
Phosphorus: 3.3 mg/dL (ref 2.5–4.6)
Potassium: 4.4 mmol/L (ref 3.5–5.1)
Sodium: 138 mmol/L (ref 135–145)

## 2024-07-21 LAB — MAGNESIUM: Magnesium: 1.9 mg/dL (ref 1.7–2.4)

## 2024-07-21 MED ORDER — VANCOMYCIN HCL 125 MG PO CAPS
125.0000 mg | ORAL_CAPSULE | Freq: Four times a day (QID) | ORAL | Status: DC
  Filled 2024-07-21 (×2): qty 1

## 2024-07-21 MED ORDER — VANCOMYCIN HCL 125 MG PO CAPS: 125.0000 mg | ORAL_CAPSULE | ORAL | Status: DC

## 2024-07-21 MED ORDER — VANCOMYCIN HCL 125 MG PO CAPS
ORAL_CAPSULE | ORAL | 0 refills | Status: DC
  Filled 2024-07-21: qty 60, 18d supply, fill #0

## 2024-07-21 MED ORDER — VANCOMYCIN HCL 125 MG PO CAPS: 125.0000 mg | ORAL_CAPSULE | Freq: Two times a day (BID) | ORAL | Status: DC

## 2024-07-21 MED ORDER — VANCOMYCIN HCL 125 MG PO CAPS: 125.0000 mg | ORAL_CAPSULE | Freq: Every day | ORAL | Status: DC

## 2024-07-21 NOTE — Discharge Summary (Signed)
 Physician Discharge Summary  Hailey [name] FMW:[id number] DOB: 1935/07/23 DOA: 07/19/2024  PCP: Garald [name], MD  Admit date: 07/19/2024 Discharge date: {dischdate:26783}  Admitted From: *** Disposition:  *** Recommendations for Outpatient Follow-up:  Outpatient follow-up with*** Check *** Please follow up on the following pending results: ***  Home Health: *** Equipment/Devices: ***  Discharge Condition: *** CODE STATUS: *** Diet Orders (From admission, onward)     Start     Ordered   07/20/24 1043  DIET SOFT Room service appropriate? Yes; Fluid consistency: Thin  Diet effective now       Question Answer Comment  Room service appropriate? Yes   Fluid consistency: Thin      07/20/24 1042             Follow-up Information     Plotnikov, [name], MD. Schedule an appointment as soon as possible for a visit in 1 week(s).   Specialty: Internal Medicine Contact information: [address] [place] [address] [phone]                 Hospital course ***  See individual problem list below for more.   Problems addressed during this hospitalization Principal Problem:   Colitis    Body mass index is 25.39 kg/m.           Consultations: ***  Time spent 35  minutes  Vital signs Vitals:   07/20/24 2239 07/21/24 0609 07/21/24 0613 07/21/24 1358  BP: (!) 143/80 (!) 118/93  122/78  Pulse: 70 67 71 66  Temp: 98.3 F (36.8 C) 98.6 F (37 C)  97.7 F (36.5 C)  Resp: 14 17    Height:      Weight:      SpO2: 94% (!) 89% 100% 95%  TempSrc: Oral Oral  Oral  BMI (Calculated):         Discharge exam  GENERAL: No apparent distress.  Nontoxic. HEENT: MMM.  Vision and hearing grossly intact.  NECK: Supple.  No apparent JVD.  RESP:  No IWOB.  Fair aeration bilaterally. CVS:  RRR. Heart sounds normal.  ABD/GI/GU: BS+. Abd soft, NTND.  MSK/EXT:  Moves extremities. No apparent deformity. No edema.  SKIN: no apparent skin  lesion or wound NEURO: Awake and alert. Oriented appropriately.  No apparent focal neuro deficit. PSYCH: Calm. Normal affect. ***  Discharge Instructions Discharge Instructions     Discharge instructions   Complete by: As directed    It has been a pleasure taking care of you!  You were hospitalized due to C. difficile infection for which you have been started on antibiotics.  We are discharging you more antibiotics to complete treatment course.  This very important that you complete the whole course of antibiotics to decrease risk of recurrence.    Take care,   Increase activity slowly   Complete by: As directed       Allergies as of 07/21/2024       Reactions   Macrobid  [nitrofurantoin  Monohyd Macro] Other (See Comments)   Nausea, stomach cramps, fatigue , headache.   Meloxicam  Other (See Comments)   Jittery and headache   Digoxin  And Related Other (See Comments)   headaches   Diprolene  [betamethasone  Dipropionate Aug] Other (See Comments)   Patient states that the bruising was made worse when using this   Ferrous Sulfate Other (See Comments)   Bad constipation   Keflex  [cephalexin ] Nausea And Vomiting   Nausea, fatigue, and headache.  Lipitor  [atorvastatin ]    Abd pain   Mobic  [meloxicam ] Other (See Comments)   Unsure of reaction type   Sulfamethoxazole -trimethoprim  Nausea Only        Medication List     STOP taking these medications    esomeprazole  40 MG capsule Commonly known as: NEXIUM    fluconazole  150 MG tablet Commonly known as: DIFLUCAN        TAKE these medications    acetaminophen  500 MG tablet Commonly known as: TYLENOL  Take 1,000 mg by mouth every 6 (six) hours as needed (body aches / sore hip). Maximum of 6 tablets daily (3000mg )   Arnicare Gel Apply 1 application  topically daily as needed (skin bruising).   BIOTIN PO Take 5,000 mcg by mouth in the morning.   clobetasol  ointment 0.05 % Commonly known as: TEMOVATE  Apply to the  affected area in a thin layer twice a day for 2 weeks as needed for a flare.  Then apply to the area twice a week at bedtime as maintenance dosing.   [employer] Sunscreen SPF 30 Lotn apply topically to sun exposed areas Daily; Duration: 30   diclofenac  Sodium 1 % Gel Commonly known as: VOLTAREN  Apply 2 g topically 4 (four) times daily as needed (back pain.).   dicyclomine  20 MG tablet Commonly known as: BENTYL  Take 1 by mouth every 4-6 hours as needed for cramping   Eliquis  2.5 MG Tabs tablet Generic drug: apixaban  TAKE 1 TABLET BY MOUTH TWICE  DAILY   escitalopram  10 MG tablet Commonly known as: Lexapro  Take 1 tablet (10 mg total) by mouth daily.   Estradiol  0.75 MG/1.25 GM (0.06%) topical gel 1 pump to skin to arm Transdermal Once a day; Duration: 30 day(s)   fluorouracil 5 % cream Commonly known as: EFUDEX 1 application (HOLD FOR SEVERE INFLAMMATION) Externally TO PRECANCERS once a day; Duration: 21 days   gabapentin 100 MG capsule Commonly known as: NEURONTIN Start with 1 capsule at night.  Advance to 1 capsule twice a day if tolerated for back pain What changed:  how much to take how to take this when to take this   hyoscyamine  0.125 MG tablet Commonly known as: LEVSIN  TAKE 1-2 TABLETS EVERY 4 HOURS AS NEEDED FOR UP TO 10 DAYS FOR CRAMPING.   Jardiance  10 MG Tabs tablet Generic drug: empagliflozin  TAKE 1 TABLET BY MOUTH DAILY  BEFORE BREAKFAST   LORazepam  1 MG tablet Commonly known as: ATIVAN  TAKE ONE TABLET BY MOUTH TWICE DAILY AS NEEDED FOR ANXIETY / SLEEP. What changed: See the new instructions.   losartan  25 MG tablet Commonly known as: COZAAR  Take 1 tablet (25 mg total) by mouth daily.   Lubricant Eye Drops 0.4-0.3 % Soln Generic drug: Polyethyl Glycol-Propyl Glycol Place 1-2 drops into both eyes at bedtime.   mupirocin  ointment 2 % Commonly known as: BACTROBAN  On leg wound w/dressing change qd or bid   nitroGLYCERIN  0.4 MG SL tablet Commonly known as:  NITROSTAT  Place 1 tablet (0.4 mg total) under the tongue every 5 (five) minutes as needed for chest pain (Call 911 at 3rd dose within 15 minutes.).   NON FORMULARY Vitamin c, vitamin d    ondansetron  8 MG disintegrating tablet Commonly known as: ZOFRAN -ODT dissolve 1 tablet (8 mg total) in mouth every 8 (eight) hours as needed for nausea or vomiting.   polyethylene glycol 17 g packet Commonly known as: MIRALAX  / GLYCOLAX  Take 17 g by mouth as needed for mild constipation or moderate constipation.   saccharomyces boulardii  250 MG capsule Commonly known as: Florastor Take 1 capsule (250 mg total) by mouth 2 (two) times daily.   sodium chloride  0.65 % Soln nasal spray Commonly known as: OCEAN Place 1 spray into both nostrils at bedtime as needed for congestion.   valACYclovir  500 MG tablet Commonly known as: VALTREX  Take 1 tablet (500 mg total) by mouth daily. Take one tablet by mouth daily for 3 days for an outbreak   vancomycin  125 MG capsule Commonly known as: VANCOCIN  Take 1 capsule (125 mg total) by mouth 4 (four) times daily for 12 days, THEN 1 capsule (125 mg total) 2 (two) times daily for 7 days, THEN 1 capsule (125 mg total) daily for 7 days, THEN 1 capsule (125 mg total) every other day for 28 days, THEN 1 capsule (125 mg total) every 3 (three) days for 28 days. Start taking on: July 21, 2024         Procedures/Studies: ***   CT ABDOMEN PELVIS W CONTRAST Result Date: 07/19/2024 EXAM: CT ABDOMEN AND PELVIS WITH CONTRAST 07/19/2024 10:36:15 AM TECHNIQUE: CT of the abdomen and pelvis was performed with the administration of 80 mL of iohexol  (OMNIPAQUE ) 300 MG/ML solution. Multiplanar reformatted images are provided for review. Automated exposure control, iterative reconstruction, and/or weight-based adjustment of the mA/kV was utilized to reduce the radiation dose to as low as reasonably achievable. COMPARISON: CT abdomen and pelvis 04/21/2024. CLINICAL HISTORY: 89  year old female with LLQ abdominal pain. FINDINGS: LOWER CHEST: Stable mild cardiomegaly. No pericardial or pleural effusion. Small chronic Bochdalek hernia on the right is stable. Superimposed bilateral lung bases atelectasis and scarring is stable. LIVER: The liver is unremarkable. GALLBLADDER AND BILE DUCTS: Gallbladder appears more distended compared to 04/21/2024 but otherwise within normal limits. No biliary ductal dilatation. SPLEEN: No acute abnormality. PANCREAS: Pancreatic atrophy. ADRENAL GLANDS: No acute abnormality. KIDNEYS, URETERS AND BLADDER: Exophytic simple fluid density left renal upper pole cyst. Per consensus, no follow-up is needed for simple Bosniak type 1 and 2 renal cysts, unless the patient has a malignancy history or risk factors. Bilateral renal enhancement and contrast excretion within normal limits. Diminutive bladder. No stones in the kidneys or ureters. No hydronephrosis. No perinephric or periureteral stranding. GI AND BOWEL: Large bowel appears indistinct, mildly thickened and inflamed from the splenic flexure, throughout the descending colon, throughout the sigmoid colon and to the rectum. Pronounced sigmoid wall thickening up to 14 mm on series 2 image 52. Relatively mild associated mesenteric inflammatory stranding throughout those segments. Fairly widespread diverticulosis throughout those segments. Upstream transverse colon has a more normal appearance, is redundant. No right colon inflammation identified. Nondilated small bowel. Decompressed stomach and duodenum. Some right colon diverticulosis. Diminutive or absent appendix is not identified. There is no bowel obstruction. PERITONEUM AND RETROPERITONEUM: No ascites. No free air. VASCULATURE: Aorta is normal in caliber. Chronic tortuosity of the visible aorta. Moderately advanced calcified aortoiliac atherosclerosis. Major arterial structures and the portal venous system appear patent. LYMPH NODES: No lymphadenopathy.  REPRODUCTIVE ORGANS: No acute abnormality. BONES AND SOFT TISSUES: Osteopenia. Advanced lumbar spine degeneration superimposed on moderate thoracolumbar scoliosis. Chronic T10 compression fracture partially visible. No acute osseous abnormality. No focal soft tissue abnormality. IMPRESSION: 1. Widespread large bowel inflammation compatible with Acute colitis from the splenic flexure to the rectum. Pronounced sigmoid wall thickening (up to 14 mm). No obstruction, abscess, or other complicating features. 2. Advanced aortoiliac atherosclerosis and spine degeneration. Stable mild cardiomegaly. Electronically signed by: Helayne Hurst MD 07/19/2024 10:59 AM  EST RP Workstation: [id number]   DG Chest 2 View Result Date: 07/19/2024 EXAM: 2 VIEW(S) XRAY OF THE CHEST 07/19/2024 09:22:00 AM COMPARISON: 02/07/2023 CLINICAL HISTORY: SOB FINDINGS: LUNGS AND PLEURA: No focal pulmonary opacity. No pulmonary edema. No pleural effusion. No pneumothorax. HEART AND MEDIASTINUM: Calcified aorta. BONES AND SOFT TISSUES: Thoracic degenerative changes. Old left humeral neck fracture. IMPRESSION: 1. No acute cardiopulmonary process. Electronically signed by: Waddell Calk MD 07/19/2024 09:40 AM EST RP Workstation: [id number]   XR C-ARM NO REPORT Result Date: 06/25/2024 Please see Notes tab for imaging impression.  Epidural Steroid injection Result Date: 06/25/2024 Eldonna Novel, MD     06/25/2024  9:03 AM Lumbar Epidural Steroid Injection - Interlaminar Approach with Fluoroscopic Guidance Patient: Hailey [name]     Date of Birth: 06/17/1935 MRN: [id number] PCP: Garald [name], MD     Visit Date: 06/25/2024  Universal Protocol:   Consent Given By: the patient Position: PRONE Additional Comments: Vital signs were monitored before and after the procedure. Patient was prepped and draped in the usual sterile fashion. The correct patient, procedure, and site was verified. Injection Procedure Details: Procedure diagnoses: Lumbar  radiculopathy [M54.16] Meds Administered: Meds ordered this encounter Medications  methylPREDNISolone  acetate (DEPO-MEDROL ) injection 40 mg  Laterality: Right Location/Site:  L3-4 Needle: 3.5 in., 20 ga. Tuohy Needle Placement: Paramedian epidural Findings:  -Comments: Excellent flow of contrast into the epidural space. Procedure Details: Using a paramedian approach from the side mentioned above, the region overlying the inferior lamina was localized under fluoroscopic visualization and the soft tissues overlying this structure were infiltrated with 4 ml. of 1% Lidocaine  without Epinephrine . The Tuohy needle was inserted into the epidural space using a paramedian approach. The epidural space was localized using loss of resistance along with counter oblique bi-planar fluoroscopic views.  After negative aspirate for air, blood, and CSF, a 2 ml. volume of Isovue -250 was injected into the epidural space and the flow of contrast was observed. Radiographs were obtained for documentation purposes.  The injectate was administered into the level noted above. Additional Comments: The patient tolerated the procedure well Dressing: 2 x 2 sterile gauze and Band-Aid  Post-procedure details: Patient was observed during the procedure. Post-procedure instructions were reviewed. Patient left the clinic in stable condition.      The results of significant diagnostics from this hospitalization (including imaging, microbiology, ancillary and laboratory) are listed below for reference.     Microbiology: Recent Results (from the past 240 hours)  Urine Culture     Status: None   Collection Time: 07/13/24  5:05 PM  Result Value Ref Range Status   MICRO NUMBER: [id number]  Final   SPECIMEN QUALITY: Adequate  Final   Sample Source URINE  Final   STATUS: FINAL  Final   Result:   Final    Less than 10,000 CFU/mL of single Gram positive organism isolated. No further testing will be performed. If clinically indicated,  recollection using a method to minimize contamination, with prompt transfer to Urine Culture Transport Tube, is recommended.  C Difficile Quick Screen w PCR reflex     Status: Abnormal   Collection Time: 07/19/24  7:39 AM   Specimen: STOOL  Result Value Ref Range Status   C Diff antigen POSITIVE (A) NEGATIVE Final   C Diff toxin NEGATIVE NEGATIVE Final   C Diff interpretation Results are indeterminate. See PCR results.  Final    Comment: Performed at [facility], [address] [place], [place] [address]  C. Diff by PCR, Reflexed     Status: Abnormal   Collection Time: 07/19/24  7:39 AM  Result Value Ref Range Status   Toxigenic C. Difficile by PCR POSITIVE (A) NEGATIVE Final    Comment: Positive for toxigenic C. difficile with little to no toxin production. Only treat if clinical presentation suggests symptomatic illness.   Hypervirulent Strain PRESUMPTIVE NEGATIVE PRESUMPTIVE NEGATIVE Final    Comment: Performed at [facility], [address], [place], [place] [address]  Blood culture (routine x 2)     Status: None (Preliminary result)   Collection Time: 07/19/24  7:40 AM   Specimen: BLOOD  Result Value Ref Range Status   Specimen Description   Final    BLOOD RIGHT ANTECUBITAL Performed at [facility], [address], [place], [place] [address]    Special Requests   Final    BOTTLES DRAWN AEROBIC AND ANAEROBIC Blood Culture adequate volume Performed at [facility], [address] [place], [place] [address]    Culture   Final    NO GROWTH 2 DAYS Performed at [facility], [address], [place], [place] [address]    Report Status PENDING  Incomplete  Blood culture (routine x 2)     Status: None (Preliminary result)   Collection Time: 07/19/24  8:06 AM   Specimen: BLOOD  Result Value Ref Range Status   Specimen Description   Final    BLOOD LEFT ANTECUBITAL Performed at [facility],  [address] [place], [place] [address]    Special Requests   Final    BOTTLES DRAWN AEROBIC AND ANAEROBIC Blood Culture adequate volume Performed at [facility], [address] [place], [place] [address]    Culture   Final    NO GROWTH 2 DAYS Performed at [facility], [address], [place], [place] [address]    Report Status PENDING  Incomplete  Gastrointestinal Panel by PCR , Stool     Status: None   Collection Time: 07/19/24  9:55 AM   Specimen: Stool  Result Value Ref Range Status   Campylobacter species NOT DETECTED NOT DETECTED Final   Plesimonas shigelloides NOT DETECTED NOT DETECTED Final   Salmonella species NOT DETECTED NOT DETECTED Final   Yersinia enterocolitica NOT DETECTED NOT DETECTED Final   Vibrio species NOT DETECTED NOT DETECTED Final   Vibrio cholerae NOT DETECTED NOT DETECTED Final   Enteroaggregative E coli (EAEC) NOT DETECTED NOT DETECTED Final   Enteropathogenic E coli (EPEC) NOT DETECTED NOT DETECTED Final   Enterotoxigenic E coli (ETEC) NOT DETECTED NOT DETECTED Final   Shiga like toxin producing E coli (STEC) NOT DETECTED NOT DETECTED Final   Shigella/Enteroinvasive E coli (EIEC) NOT DETECTED NOT DETECTED Final   Cryptosporidium NOT DETECTED NOT DETECTED Final   Cyclospora cayetanensis NOT DETECTED NOT DETECTED Final   Entamoeba histolytica NOT DETECTED NOT DETECTED Final   Giardia lamblia NOT DETECTED NOT DETECTED Final   Adenovirus F40/41 NOT DETECTED NOT DETECTED Final   Astrovirus NOT DETECTED NOT DETECTED Final   Norovirus GI/GII NOT DETECTED NOT DETECTED Final   Rotavirus A NOT DETECTED NOT DETECTED Final   Sapovirus (I, II, IV, and V) NOT DETECTED NOT DETECTED Final    Comment: Performed at [facility], [address] [place], [place] [address]     Labs:  CBC: Recent Labs  Lab 07/19/24 0740 07/20/24 0518 07/21/24 0529  WBC 6.7  6.9 5.2  HGB 13.5 10.8* 10.4*  HCT 42.0 34.4* 33.0*  MCV 99.5  102.4* 101.9*  PLT 203 153 143*   BMP &GFR Recent Labs  Lab 07/19/24 0740 07/20/24 0518 07/21/24 0529  NA 139 136 138  K 4.1 4.3 4.4  CL 104 105 107  CO2 24 22 22   GLUCOSE 103* 105* 78  BUN 27* 19 14  CREATININE 1.02* 0.94 0.91  CALCIUM  9.6 8.6* 8.8*  MG  --   --  1.9  PHOS  --   --  3.3   Estimated Creatinine Clearance: 41 mL/min (by C-G formula based on SCr of 0.91 mg/dL). Liver & Pancreas: Recent Labs  Lab 07/19/24 0740 07/21/24 0529  AST 21  --   ALT 7  --   ALKPHOS 77  --   BILITOT 0.3  --   PROT 6.7  --   ALBUMIN 4.2 3.3*   Recent Labs  Lab 07/19/24 0740  LIPASE 54*   No results for input(s): AMMONIA in the last 168 hours. Diabetic: Recent Labs    07/19/24 0740  HGBA1C 5.1   Recent Labs  Lab 07/19/24 1224 07/19/24 1823 07/20/24 0254 07/20/24 0804 07/20/24 1256  GLUCAP 85 137* 84 79 92   Cardiac Enzymes: No results for input(s): CKTOTAL, CKMB, CKMBINDEX, TROPONINI in the last 168 hours. No results for input(s): PROBNP in the last 8760 hours. Coagulation Profile: No results for input(s): INR, PROTIME in the last 168 hours. Thyroid  Function Tests: No results for input(s): TSH, T4TOTAL, FREET4, T3FREE, THYROIDAB in the last 72 hours. Lipid Profile: No results for input(s): CHOL, HDL, LDLCALC, TRIG, CHOLHDL, LDLDIRECT in the last 72 hours. Anemia Panel: Recent Labs    07/20/24 1202  VITAMINB12 540  FOLATE 19.2  FERRITIN 146  TIBC 234*  IRON  24*  RETICCTPCT 2.2   Urine analysis:    Component Value Date/Time   COLORURINE YELLOW 07/20/2024 0429   APPEARANCEUR CLEAR 07/20/2024 0429   LABSPEC 1.033 (H) 07/20/2024 0429   PHURINE 5.0 07/20/2024 0429   GLUCOSEU >=500 (A) 07/20/2024 0429   GLUCOSEU >=1000 (A) 05/21/2024 1148   HGBUR NEGATIVE 07/20/2024 0429   BILIRUBINUR NEGATIVE 07/20/2024 0429   KETONESUR NEGATIVE 07/20/2024 0429   PROTEINUR NEGATIVE 07/20/2024 0429   UROBILINOGEN 0.2 05/21/2024  1148   NITRITE NEGATIVE 07/20/2024 0429   LEUKOCYTESUR SMALL (A) 07/20/2024 0429   Sepsis Labs: Invalid input(s): PROCALCITONIN, LACTICIDVEN   SIGNED:  Margurette Brener T Gaspare Netzel, MD  [facility] 07/21/2024, 5:00 PM

## 2024-07-21 NOTE — TOC Initial Note (Signed)
 Transition of Care [facility]) - Initial/Assessment Note    Patient Details  Name: Hailey [name] MRN: [id number] Date of Birth: 1934/12/23  Transition of Care [facility]) CM/SW Contact:    Alfonse JONELLE Rex, RN Phone Number: 07/21/2024, 11:51 AM  Clinical Narrative:    Patient admitted from home with c/o abdominal pain/diarrhea, resides in private residence, has family contacts on file, PCP and insurance on file. PT eval completed, no recommendations. INPT CM will continue to follow.                Expected Discharge Plan: Home/Self Care Barriers to Discharge: Continued Medical Work up   Patient Goals and CMS Choice Patient states their goals for this hospitalization and ongoing recovery are:: return home          Expected Discharge Plan and Services       Living arrangements for the past 2 months: Single Family Home                                      Prior Living Arrangements/Services Living arrangements for the past 2 months: Single Family Home Lives with:: Spouse Patient language and need for interpreter reviewed:: Yes Do you feel safe going back to the place where you live?: Yes      Need for Family Participation in Patient Care: Yes (Comment) Care giver support system in place?: Yes (comment)   Criminal Activity/Legal Involvement Pertinent to Current Situation/Hospitalization: No - Comment as needed  Activities of Daily Living   ADL Screening (condition at time of admission) Independently performs ADLs?: Yes (appropriate for developmental age) Is the patient deaf or have difficulty hearing?: Yes Does the patient have difficulty seeing, even when wearing glasses/contacts?: No Does the patient have difficulty concentrating, remembering, or making decisions?: No  Permission Sought/Granted                  Emotional Assessment       Orientation: : Oriented to Self, Oriented to Place, Oriented to  Time, Oriented to Situation Alcohol / Substance Use: Not  Applicable Psych Involvement: No (comment)  Admission diagnosis:  Colitis [K52.9] C. difficile colitis [A04.72] Hypotension, unspecified hypotension type [I95.9] Left lower quadrant abdominal pain [R10.32] Patient Active Problem List   Diagnosis Date Noted   C. difficile colitis 06/01/2024   Clostridioides difficile infection 10/22/2023   Supraclavicular fossa fullness 09/24/2023   COVID-19 09/24/2023   Cough 06/28/2023   Degenerative tear of left medial meniscus 06/20/2023   History of skin cancer 05/20/2023   Melanocytic nevi of trunk 05/20/2023   Seborrheic keratoses 05/20/2023   Squamous cell carcinoma of skin of right lower extremity 05/20/2023   Mitral regurgitation 02/22/2023   Aneurysm of ascending aorta without rupture 02/18/2023   Cellulitis of left leg 02/18/2023   Closed comminuted fracture of left humerus 02/07/2023   Bradycardia 02/07/2023   Fall 02/07/2023   Chronic a-fib (HCC) 02/07/2023   Statin intolerance 12/31/2022   Acute pain of right knee 12/06/2022   Impetigo 10/01/2022   Insomnia 10/01/2022   Osteoporosis 06/11/2022   Weakness 04/25/2022   Weakness generalized 04/25/2022   Falls frequently 04/19/2022   Iron  deficiency anemia 12/08/2021   Skin ulcer, limited to breakdown of skin (HCC) 11/22/2021   Anemia, iron  deficiency 11/22/2021   Low serum vitamin B12 11/22/2021   Acute renal failure superimposed on stage 2 chronic kidney disease 11/20/2021   ABLA (  acute blood loss anemia) 11/20/2021   Hypotension 11/19/2021   Shortness of breath 06/12/2021   Right leg pain 02/21/2021   History of Clostridioides difficile colitis    Abnormal CT of the abdomen    Aortic atherosclerosis 08/27/2020   Acute prerenal azotemia 08/27/2020   Shock circulatory (HCC) 08/27/2020   Metabolic acidosis with normal anion gap and bicarbonate losses 08/27/2020   Abdominal cramps 03/16/2020   Neurogenic orthostatic hypotension (HCC) 09/16/2019   Greater trochanteric pain  syndrome of right lower extremity 07/30/2019   Abdominal pain 05/22/2018   Bradycardia with 41-50 beats per minute 12/27/2017   Right bundle branch block (RBBB) on electrocardiogram (ECG) 12/27/2017   Chronic kidney disease, stage 3b (HCC) 12/24/2017   Cellulitis of leg, right with large prepatella hematoma and open wounds 12/24/2017   Iliotibial band syndrome of right side 12/16/2017   Intervertebral lumbar disc disorder with myelopathy, lumbar region 04/25/2017   Family history of colon cancer in mother 01/22/2017   Primary osteoarthritis of both first carpometacarpal joints 01/02/2017   Pain 11/21/2016   Colitis 09/13/2016   Chronic anticoagulation 05/24/2015   Coronary artery disease involving native coronary artery of native heart with angina pectoris 12/20/2014   Diarrhea 12/22/2013   Permanent atrial fibrillation (HCC) 11/21/2012   Greater trochanteric bursitis of both hips 05/21/2012   Well adult exam 11/07/2011   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 [place] [address] Phone: [phone] Fax: [phone]  [id number] [place], [place] - [address] [place] [address] Phone: [phone] Fax: [phone]  [facility] [address] [place] [address] Phone: [phone] Fax: [phone]  [facility] - [place], [place] - [address] [place] [address] Phone: [phone] Fax: [phone]  [id number] [place], [place] - [address] [facility] [address] [place] [address] Phone: [phone] Fax: [phone]  [facility] [address] [place] [address] Phone: [phone] Fax: [phone]     Social Drivers of Health (SDOH) Social History: SDOH Screenings   Food Insecurity: No Food Insecurity (07/19/2024)  Housing: Low Risk  (07/19/2024)  Transportation Needs: No Transportation Needs (07/19/2024)  Utilities: Not At Risk (07/19/2024)  Alcohol Screen: Low Risk  (11/22/2023)  Depression (PHQ2-9): Low Risk  (02/19/2024)  Financial Resource Strain: Low Risk  (11/22/2023)  Physical Activity: Insufficiently Active (11/22/2023)  Social Connections: Moderately Isolated (07/19/2024)  Stress: No Stress Concern Present (11/22/2023)  Tobacco Use: Medium Risk (07/19/2024)  Health Literacy: Adequate Health Literacy (11/22/2023)   SDOH Interventions:     Readmission Risk Interventions    07/21/2024   11:50 AM 04/27/2022   12:18 PM  Readmission Risk Prevention Plan  Transportation Screening Complete Complete  PCP or Specialist Appt within 5-7 Days Complete Not Complete  Not Complete comments  appointment on 9/1  Home Care Screening Complete Complete  Medication Review (RN CM) Complete Complete

## 2024-07-21 NOTE — Progress Notes (Signed)
 PROGRESS NOTE  Hailey [name] FMW:[id number] DOB: 1935-04-30   PCP: Garald [name], MD  Patient is from: Home. Lives with husband.  Independently ambulates at baseline.  DOA: 07/19/2024 LOS: 2  Chief complaints Chief Complaint  Patient presents with   Abdominal Pain     Brief Narrative / Interim history: 89 year old F with PMH of recurrent C. difficile, paroxysmal A-fib on Eliquis , IDDM-2, HTN, IBS, anxiety and depression resenting with new onset sev LLQ abdominal pain and watery diarrhea, and admitted with C. difficile colitis.  In ED, afebrile without leukocytosis.  C. difficile testing discordant.  Reflex C. difficile PCR pending.  CT abdomen and pelvis with widespread large bowel inflammation compatible with acute colitis from the splenic flexure to the rectum.  Patient was started on p.o. vancomycin  after discussion with GI.    Of note, patient was treated for C. difficile in 10/2023 at 05/2024.  The next day, C. difficile PCR positive for toxigenic C. difficile with little to no toxin production.  Continued to have diarrhea.  GIP negative.  Blood cultures NGTD.  ID consulted and following.  Subjective: Seen and examined earlier this morning.  No major events overnight or this morning.  Reports improvement in diarrhea.  She has 2 bowel movements last night.  Stool is mucoid.  Denies blood in the stool.  Abdominal pain improved.   Assessment and plan: Recurrent C. difficile colitis: Previously treated for C. difficile in 10/2023 at 05/2024.  C. difficile testing and CT abdomen and pelvis as above.  Diarrhea improved.  Still with some tenderness over LLQ. -Continue p.o. vancomycin -she may need a slow taper given recurrence.  ID on board. -Continue analgesics. -Continue enteric precaution -GI signed off.  Microcytic anemia: Hgb down 2 g from baseline but stable.  No melena or hematochezia.  Anemia panel without significant nutritional deficiency. -Recheck in the  morning  Paroxysmal A-fib: In sinus rhythm. - Continue home Eliquis .   Essential hypertension: Normotensive for most part -Hold home losartan .  NIDDM-2: A1c 5.1%.  CBG within acceptable range.  On Jardiance  at home. -Discontinued CBG monitoring and SSI.  History of IBS -Continue home Levsin   Neuropathy - Continue home gabapentin  Mood disorder: Stable. -Continue home meds  Thrombocytopenia: Mild - Monitor  Body mass index is 25.39 kg/m.           DVT prophylaxis:  apixaban  (ELIQUIS ) tablet 2.5 mg Start: 07/19/24 1445 apixaban  (ELIQUIS ) tablet 2.5 mg  Code Status: Full code Family Communication: None at bedside Level of care: Med-Surg Status is: Inpatient Remains inpatient appropriate because: Recurrent C. difficile colitis   Final disposition: Home   35 minutes with more than 50% spent in reviewing records, counseling patient/family and coordinating care.  Consultants:  Gastroenterology Infectious disease  Procedures: None  Microbiology summarized: Blood cultures NGTD GIP negative C. difficile testing as above.  Objective: Vitals:   07/20/24 1252 07/20/24 2239 07/21/24 0609 07/21/24 0613  BP: 108/75 (!) 143/80 (!) 118/93   Pulse: 67 70 67 71  Resp:  14 17   Temp: 98.3 F (36.8 C) 98.3 F (36.8 C) 98.6 F (37 C)   TempSrc: Oral Oral Oral   SpO2: 91% 94% (!) 89% 100%  Weight:      Height:        Examination:  GENERAL: No apparent distress.  Nontoxic. HEENT: MMM.  Vision and hearing grossly intact.  NECK: Supple.  No apparent JVD.  RESP:  No IWOB.  Fair aeration bilaterally. CVS:  RRR.  Heart sounds normal.  ABD/GI/GU: BS+. Abd soft.  LLQ tenderness.  No rebound or guarding. MSK/EXT:  Moves extremities. No apparent deformity. No edema.  SKIN: no apparent skin lesion or wound NEURO: AA.  Oriented appropriately.  No apparent focal neuro deficit. PSYCH: Calm. Normal affect.   Sch Meds:  Scheduled Meds:  apixaban   2.5 mg Oral BID    escitalopram   10 mg Oral QHS   gabapentin  100 mg Oral QHS   pantoprazole   40 mg Oral Daily   saccharomyces boulardii  250 mg Oral BID   vancomycin   125 mg Oral QID   Continuous Infusions:   PRN Meds:.acetaminophen  **OR** acetaminophen , HYDROmorphone  (DILAUDID ) injection, hyoscyamine , LORazepam , ondansetron  **OR** ondansetron  (ZOFRAN ) IV, sodium chloride   Antimicrobials: Anti-infectives (From admission, onward)    Start     Dose/Rate Route Frequency Ordered Stop   09/14/24 1000  vancomycin  (VANCOCIN ) capsule 125 mg  Status:  Discontinued       Placed in Followed by Linked Group   125 mg Oral Every 3 DAYS 07/20/24 1059 07/20/24 1059   08/17/24 1000  vancomycin  (VANCOCIN ) capsule 125 mg  Status:  Discontinued       Placed in Followed by Linked Group   125 mg Oral Every other day 07/20/24 1059 07/20/24 1059   08/10/24 1000  vancomycin  (VANCOCIN ) capsule 125 mg  Status:  Discontinued       Placed in Followed by Linked Group   125 mg Oral Daily 07/20/24 1059 07/20/24 1059   08/03/24 1000  vancomycin  (VANCOCIN ) capsule 125 mg  Status:  Discontinued       Placed in Followed by Linked Group   125 mg Oral 2 times daily 07/20/24 1059 07/20/24 1059   07/20/24 1145  vancomycin  (VANCOCIN ) capsule 125 mg  Status:  Discontinued       Placed in Followed by Linked Group   125 mg Oral 4 times daily 07/20/24 1059 07/20/24 1059   07/19/24 1230  vancomycin  (VANCOCIN ) capsule 125 mg        125 mg Oral 4 times daily 07/19/24 1210 07/29/24 1359   07/19/24 1130  ciprofloxacin  (CIPRO ) IVPB 400 mg  Status:  Discontinued        400 mg 200 mL/hr over 60 Minutes Intravenous Every 12 hours 07/19/24 1129 07/19/24 1132   07/19/24 1130  metroNIDAZOLE  (FLAGYL ) IVPB 500 mg  Status:  Discontinued        500 mg 100 mL/hr over 60 Minutes Intravenous Every 12 hours 07/19/24 1129 07/19/24 1132        I have personally reviewed the following labs and images: CBC: Recent Labs  Lab 07/19/24 0740  07/20/24 0518 07/21/24 0529  WBC 6.7 6.9 5.2  HGB 13.5 10.8* 10.4*  HCT 42.0 34.4* 33.0*  MCV 99.5 102.4* 101.9*  PLT 203 153 143*   BMP &GFR Recent Labs  Lab 07/19/24 0740 07/20/24 0518 07/21/24 0529  NA 139 136 138  K 4.1 4.3 4.4  CL 104 105 107  CO2 24 22 22   GLUCOSE 103* 105* 78  BUN 27* 19 14  CREATININE 1.02* 0.94 0.91  CALCIUM  9.6 8.6* 8.8*  MG  --   --  1.9  PHOS  --   --  3.3   Estimated Creatinine Clearance: 41 mL/min (by C-G formula based on SCr of 0.91 mg/dL). Liver & Pancreas: Recent Labs  Lab 07/19/24 0740 07/21/24 0529  AST 21  --   ALT 7  --   ALKPHOS 77  --  BILITOT 0.3  --   PROT 6.7  --   ALBUMIN 4.2 3.3*   Recent Labs  Lab 07/19/24 0740  LIPASE 54*   No results for input(s): AMMONIA in the last 168 hours. Diabetic: Recent Labs    07/19/24 0740  HGBA1C 5.1   Recent Labs  Lab 07/19/24 1224 07/19/24 1823 07/20/24 0254 07/20/24 0804 07/20/24 1256  GLUCAP 85 137* 84 79 92   Cardiac Enzymes: No results for input(s): CKTOTAL, CKMB, CKMBINDEX, TROPONINI in the last 168 hours. No results for input(s): PROBNP in the last 8760 hours. Coagulation Profile: No results for input(s): INR, PROTIME in the last 168 hours. Thyroid  Function Tests: No results for input(s): TSH, T4TOTAL, FREET4, T3FREE, THYROIDAB in the last 72 hours. Lipid Profile: No results for input(s): CHOL, HDL, LDLCALC, TRIG, CHOLHDL, LDLDIRECT in the last 72 hours. Anemia Panel: Recent Labs    07/20/24 1202  VITAMINB12 540  FOLATE 19.2  FERRITIN 146  TIBC 234*  IRON  24*  RETICCTPCT 2.2   Urine analysis:    Component Value Date/Time   COLORURINE YELLOW 07/20/2024 0429   APPEARANCEUR CLEAR 07/20/2024 0429   LABSPEC 1.033 (H) 07/20/2024 0429   PHURINE 5.0 07/20/2024 0429   GLUCOSEU >=500 (A) 07/20/2024 0429   GLUCOSEU >=1000 (A) 05/21/2024 1148   HGBUR NEGATIVE 07/20/2024 0429   BILIRUBINUR NEGATIVE 07/20/2024 0429    KETONESUR NEGATIVE 07/20/2024 0429   PROTEINUR NEGATIVE 07/20/2024 0429   UROBILINOGEN 0.2 05/21/2024 1148   NITRITE NEGATIVE 07/20/2024 0429   LEUKOCYTESUR SMALL (A) 07/20/2024 0429   Sepsis Labs: Invalid input(s): PROCALCITONIN, LACTICIDVEN  Microbiology: Recent Results (from the past 240 hours)  Urine Culture     Status: None   Collection Time: 07/13/24  5:05 PM  Result Value Ref Range Status   MICRO NUMBER: [id number]  Final   SPECIMEN QUALITY: Adequate  Final   Sample Source URINE  Final   STATUS: FINAL  Final   Result:   Final    Less than 10,000 CFU/mL of single Gram positive organism isolated. No further testing will be performed. If clinically indicated, recollection using a method to minimize contamination, with prompt transfer to Urine Culture Transport Tube, is recommended.  C Difficile Quick Screen w PCR reflex     Status: Abnormal   Collection Time: 07/19/24  7:39 AM   Specimen: STOOL  Result Value Ref Range Status   C Diff antigen POSITIVE (A) NEGATIVE Final   C Diff toxin NEGATIVE NEGATIVE Final   C Diff interpretation Results are indeterminate. See PCR results.  Final    Comment: Performed at [facility], [address], [place], [place] [address]  C. Diff by PCR, Reflexed     Status: Abnormal   Collection Time: 07/19/24  7:39 AM  Result Value Ref Range Status   Toxigenic C. Difficile by PCR POSITIVE (A) NEGATIVE Final    Comment: Positive for toxigenic C. difficile with little to no toxin production. Only treat if clinical presentation suggests symptomatic illness.   Hypervirulent Strain PRESUMPTIVE NEGATIVE PRESUMPTIVE NEGATIVE Final    Comment: Performed at [facility], [address] [place], [place] [address]  Blood culture (routine x 2)     Status: None (Preliminary result)   Collection Time: 07/19/24  7:40 AM   Specimen: BLOOD  Result Value Ref Range Status   Specimen Description   Final    BLOOD RIGHT  ANTECUBITAL Performed at [facility], [address] [place],  [place] [address]    Special Requests   Final    BOTTLES DRAWN AEROBIC AND ANAEROBIC Blood Culture adequate volume Performed at [facility], [address], [place], [place] [address]    Culture   Final    NO GROWTH 2 DAYS Performed at [facility], [address], [place], [place] [address]    Report Status PENDING  Incomplete  Blood culture (routine x 2)     Status: None (Preliminary result)   Collection Time: 07/19/24  8:06 AM   Specimen: BLOOD  Result Value Ref Range Status   Specimen Description   Final    BLOOD LEFT ANTECUBITAL Performed at [facility], [address], [place], [place] [address]    Special Requests   Final    BOTTLES DRAWN AEROBIC AND ANAEROBIC Blood Culture adequate volume Performed at [facility], [address] [place], [place] [address]    Culture   Final    NO GROWTH 2 DAYS Performed at [facility], [address] [place], [place] [address]    Report Status PENDING  Incomplete  Gastrointestinal Panel by PCR , Stool     Status: None   Collection Time: 07/19/24  9:55 AM   Specimen: Stool  Result Value Ref Range Status   Campylobacter species NOT DETECTED NOT DETECTED Final   Plesimonas shigelloides NOT DETECTED NOT DETECTED Final   Salmonella species NOT DETECTED NOT DETECTED Final   Yersinia enterocolitica NOT DETECTED NOT DETECTED Final   Vibrio species NOT DETECTED NOT DETECTED Final   Vibrio cholerae NOT DETECTED NOT DETECTED Final   Enteroaggregative E coli (EAEC) NOT DETECTED NOT DETECTED Final   Enteropathogenic E coli (EPEC) NOT DETECTED NOT DETECTED Final   Enterotoxigenic E coli (ETEC) NOT DETECTED NOT DETECTED Final   Shiga like toxin producing E coli (STEC) NOT DETECTED NOT DETECTED Final   Shigella/Enteroinvasive E coli (EIEC) NOT DETECTED NOT DETECTED Final   Cryptosporidium NOT  DETECTED NOT DETECTED Final   Cyclospora cayetanensis NOT DETECTED NOT DETECTED Final   Entamoeba histolytica NOT DETECTED NOT DETECTED Final   Giardia lamblia NOT DETECTED NOT DETECTED Final   Adenovirus F40/41 NOT DETECTED NOT DETECTED Final   Astrovirus NOT DETECTED NOT DETECTED Final   Norovirus GI/GII NOT DETECTED NOT DETECTED Final   Rotavirus A NOT DETECTED NOT DETECTED Final   Sapovirus (I, II, IV, and V) NOT DETECTED NOT DETECTED Final    Comment: Performed at [facility], [address], [place], [place] [address]    Radiology Studies: No results found.    Jonathandavid Marlett T. Alyanna Stoermer [facility]  If 7PM-7AM, please contact night-coverage [email or web address] 07/21/2024, 12:55 PM

## 2024-07-22 ENCOUNTER — Telehealth: Payer: Self-pay | Admitting: *Deleted

## 2024-07-22 NOTE — Transitions of Care (Post Inpatient/ED Visit) (Signed)
 07/22/2024  Name: AMYRA VANTUYL MRN: [id number] DOB: June 03, 1935  Today's TOC FU Call Status: Today's TOC FU Call Status:: Successful TOC FU Call Completed TOC FU Call Complete Date: 07/22/24  Patient's Name and Date of Birth confirmed. Name, DOB  Transition Care Management Follow-up Telephone Call Date of Discharge: 07/21/24 Discharge Facility: [facility]) Type of Discharge: Inpatient Admission Primary Inpatient Discharge Diagnosis:: abdominal pain/ hypotension secondary to recurrent C-Diff colitis How have you been since you were released from the hospital?: Better (I am doing fine and feeling much better; going to see Dr. Garald tomorrow as planned.  I am independent and drive myself, my husband helps with anything I might need- but that is not much.  I don't need any check-in calls- I am fine) Any questions or concerns?: No  Items Reviewed: Did you receive and understand the discharge instructions provided?: Yes (thoroughly reviewed with patient who verbalizes good understanding of same) Medications obtained,verified, and reconciled?: Yes (Medications Reviewed) (Full medication reconciliation/ review completed; no concerns or discrepancies identified; confirmed patient obtained/ is taking all newly Rx'd medications as instructed; self-manages medications and denies questions/ concerns around medications today) Any new allergies since your discharge?: No Dietary orders reviewed?: Yes Type of Diet Ordered:: I try to eat as healthy as possible, avoid spicy foods Do you have support at home?: Yes People in Home [RPT]: spouse Name of Support/Comfort Primary Source: Reports independent in self-care activities; resides with supportive spouse assists as/ if needed/ indicated  Medications Reviewed Today: Medications Reviewed Today     Reviewed by Damacio Weisgerber M, RN (Registered Nurse) on 07/22/24 at 1437  Med List Status: <None>   Medication Order Taking? Sig Documenting  Provider Last Dose Status Informant  acetaminophen  (TYLENOL ) 500 MG tablet [id number] Yes Take 1,000 mg by mouth every 6 (six) hours as needed (body aches / sore hip). Maximum of 6 tablets daily (3000mg ) [provider]  Active Self, Pharmacy Records  apixaban  (ELIQUIS ) 2.5 MG TABS tablet [id number] Yes TAKE 1 TABLET BY MOUTH TWICE  DAILY Thukkani, Arun K, MD  Active Self, Pharmacy Records  BIOTIN PO [id number]  Take 5,000 mcg by mouth in the morning.  Patient not taking: Reported on 07/22/2024   [provider]  Active Self, Pharmacy Records  clobetasol  ointment (TEMOVATE ) 0.05 % [id number]  Apply to the affected area in a thin layer twice a day for 2 weeks as needed for a flare.  Then apply to the area twice a week at bedtime as maintenance dosing.  Patient not taking: Reported on 07/22/2024   Cathlyn JAYSON Nikki Bobie FORBES, MD  Active Self, Pharmacy Records  [facility] Sunscreen SPF 30 REYNALDO [id number] Yes apply topically to sun exposed areas Daily; Duration: 30 [provider]  Active Self, Pharmacy Records  diclofenac  Sodium (VOLTAREN ) 1 % GEL [id number] Yes Apply 2 g topically 4 (four) times daily as needed (back pain.). [provider]  Active Self, Pharmacy Records  dicyclomine  (BENTYL ) 20 MG tablet [id number] Yes Take 1 by mouth every 4-6 hours as needed for cramping Abran Norleen SAILOR, MD  Active Self, Pharmacy Records  empagliflozin  (JARDIANCE ) 10 MG TABS tablet [id number] Yes TAKE 1 TABLET BY MOUTH DAILY  BEFORE BREAKFAST Lelon Glendia DASEN, PA-C  Active Self, Pharmacy Records  escitalopram  (LEXAPRO ) 10 MG tablet [id number] Yes Take 1 tablet (10 mg total) by mouth daily. Plotnikov, Aleksei V, MD  Active Self, Pharmacy Records  Estradiol  0.75 MG/1.25 GM (0.06%) topical gel [id number]  1 pump  to skin to arm Transdermal Once a day; Duration: 30 day(s)  Patient not taking: Reported on 07/22/2024   [provider]  Active Self, Pharmacy Records  fluorouracil (EFUDEX) 5 % cream  [id number]  1 application (HOLD FOR SEVERE INFLAMMATION) Externally TO PRECANCERS once a day; Duration: 21 days  Patient not taking: Reported on 07/22/2024   [provider]  Active Self, Pharmacy Records  gabapentin (NEURONTIN) 100 MG capsule [id number] Yes Start with 1 capsule at night.  Advance to 1 capsule twice a day if tolerated for back pain Plotnikov, Aleksei V, MD  Active Self, Pharmacy Records  Homeopathic Products [facility]) GEL [id number] Yes Apply 1 application  topically daily as needed (skin bruising). [provider]  Active Self, Pharmacy Records  hyoscyamine  (LEVSIN ) 0.125 MG tablet [id number] Yes TAKE 1-2 TABLETS EVERY 4 HOURS AS NEEDED FOR UP TO 10 DAYS FOR CRAMPING. Plotnikov, Aleksei V, MD  Active Self, Pharmacy Records  LORazepam  (ATIVAN ) 1 MG tablet [id number] Yes TAKE ONE TABLET BY MOUTH TWICE DAILY AS NEEDED FOR ANXIETY / SLEEP. Plotnikov, Aleksei V, MD  Active Self, Pharmacy Records  losartan  (COZAAR ) 25 MG tablet [id number] Yes Take 1 tablet (25 mg total) by mouth daily. Thukkani, Arun K, MD  Active Self, Pharmacy Records  mupirocin  ointment (BACTROBAN ) 2 % [id number]  On leg wound w/dressing change qd or bid  Patient not taking: Reported on 07/22/2024   Plotnikov, Aleksei V, MD  Active Self, Pharmacy Records  nitroGLYCERIN  (NITROSTAT ) 0.4 MG SL tablet [id number] Yes Place 1 tablet (0.4 mg total) under the tongue every 5 (five) minutes as needed for chest pain (Call 911 at 3rd dose within 15 minutes.). Plotnikov, Aleksei V, MD  Active Self, Pharmacy Records  NON FORMULARY [id number] Yes Vitamin c, vitamin d  [provider]  Active Self, Pharmacy Records  ondansetron  (ZOFRAN -ODT) 8 MG disintegrating tablet [id number] Yes dissolve 1 tablet (8 mg total) in mouth every 8 (eight) hours as needed for nausea or vomiting. Plotnikov, Aleksei V, MD  Active Self, Pharmacy Records  Polyethyl Glycol-Propyl Glycol (LUBRICANT EYE DROPS) 0.4-0.3 % SOLN [id number] Yes Place  1-2 drops into both eyes at bedtime. [provider]  Active Self, Pharmacy Records  polyethylene glycol (MIRALAX  / GLYCOLAX ) 17 g packet [id number] Yes Take 17 g by mouth as needed for mild constipation or moderate constipation. [provider]  Active Self, Pharmacy Records  saccharomyces boulardii (FLORASTOR) 250 MG capsule [id number] Yes Take 1 capsule (250 mg total) by mouth 2 (two) times daily. Nandigam, Kavitha V, MD  Active Self, Pharmacy Records  sodium chloride  (OCEAN) 0.65 % SOLN nasal spray [id number] Yes Place 1 spray into both nostrils at bedtime as needed for congestion. [provider]  Active Spouse/Significant Other, Self, Pharmacy Records  valACYclovir  (VALTREX ) 500 MG tablet [id number] Yes Take 1 tablet (500 mg total) by mouth daily. Take one tablet by mouth daily for 3 days for an outbreak Cathlyn JAYSON Nikki Bobie FORBES, MD  Active Self, Pharmacy Records  vancomycin  (VANCOCIN ) 125 MG capsule [id number] Yes Take 1 capsule by mouth 4 times daily for 12 days, THEN 1 capsule twice daily for 7 days, THEN 1 capsule daily for 7 days, THEN 1 capsule every other day for 28 days, THEN 1 capsule  every 3 days for 28 days. Gonfa, Taye T, MD  Active            Home Care and Equipment/Supplies: Were Home Health Services Ordered?: No Any new equipment or medical supplies ordered?:  No  Functional Questionnaire: Do you need assistance with bathing/showering or dressing?: No Do you need assistance with meal preparation?: No Do you need assistance with eating?: No Do you have difficulty maintaining continence: Yes (Occasionally; happens rarely only) Do you need assistance with getting out of bed/getting out of a chair/moving?: No Do you have difficulty managing or taking your medications?: No  Follow up appointments reviewed: PCP Follow-up appointment confirmed?: Yes Date of PCP follow-up appointment?: 07/23/24 Follow-up Provider: PCP- Dr. Garald Specialist  [facility] Follow-up appointment confirmed?: NA (verified not indicated per hospital discharging provider discharge notes) Do you need transportation to your follow-up appointment?: No Do you understand care options if your condition(s) worsen?: Yes-patient verbalized understanding  SDOH Interventions Today    Flowsheet Row Most Recent Value  SDOH Interventions   Food Insecurity Interventions Intervention Not Indicated  Housing Interventions Intervention Not Indicated  Transportation Interventions Intervention Not Indicated  [drives self,  husband assists as indicated]  Utilities Interventions Intervention Not Indicated   See TOC assessment tabs for additional assessment/ TOC intervention information  Patient declines need for ongoing/ further care management/ coordination outreach; declines enrollment in 30-day TOC program- declines taking my direct phone number should needs/ concerns arise post-TOC call   Pls call/ message for questions,  Selden Noteboom Mckinney Lokelani Lutes, RN, BSN, Media Planner  Transitions of Care  [facility] Population [facility]  [place] [phone]: direct office

## 2024-07-23 ENCOUNTER — Encounter: Payer: Self-pay | Admitting: Internal Medicine

## 2024-07-23 ENCOUNTER — Ambulatory Visit: Admitting: Internal Medicine

## 2024-07-23 VITALS — BP 118/74 | HR 77 | Ht 64.0 in | Wt 145.0 lb

## 2024-07-23 DIAGNOSIS — A0472 Enterocolitis due to Clostridium difficile, not specified as recurrent: Secondary | ICD-10-CM

## 2024-07-23 DIAGNOSIS — F419 Anxiety disorder, unspecified: Secondary | ICD-10-CM | POA: Diagnosis not present

## 2024-07-23 DIAGNOSIS — N1832 Chronic kidney disease, stage 3b: Secondary | ICD-10-CM

## 2024-07-23 DIAGNOSIS — D500 Iron deficiency anemia secondary to blood loss (chronic): Secondary | ICD-10-CM | POA: Diagnosis not present

## 2024-07-23 MED ORDER — FLUCONAZOLE 150 MG PO TABS: 150.0000 mg | ORAL_TABLET | Freq: Once | ORAL | 0 refills | Status: AC

## 2024-07-23 MED ORDER — ONDANSETRON HCL 4 MG PO TABS: 4.0000 mg | ORAL_TABLET | Freq: Three times a day (TID) | ORAL | 1 refills | Status: AC | PRN

## 2024-07-23 MED ORDER — VOWST PO CAPS: 4.0000 | ORAL_CAPSULE | Freq: Two times a day (BID) | ORAL | 0 refills | Status: AC

## 2024-07-23 NOTE — Progress Notes (Signed)
 Subjective:  Patient ID: Hailey [name], female    DOB: 1935/01/06  Age: 89 y.o. MRN: [id number]  CC: Hospitalization Follow-up   HPI Hailey [name] presents for post-hospital f/u: C diff colitis 3d episode, abd pain, dehydration  Per hx: Admit date: 07/19/2024 Discharge date: 07/21/2024   Admitted From: Home Disposition: Home Recommendations for Outpatient Follow-up:  Outpatient follow-up with PCP in 1 to 2 weeks Follow-up with GI in 3 to 4 weeks Check CMP and CBC at follow-up Please follow up on the following pending results: None   Home Health: No need identified Equipment/Devices: No need identified   Discharge Condition: Stable CODE STATUS: Full code Diet Orders (From admission, onward)        Start     Ordered    07/20/24 1043   DIET SOFT Room service appropriate? Yes; Fluid consistency: Thin  Diet effective now       Question Answer Comment  Room service appropriate? Yes    Fluid consistency: Thin       07/20/24 1042                  Follow-up Information       [name], [name], MD. Schedule an appointment as soon as possible for a visit in 1 week(s).   Specialty: Internal Medicine Contact information: [address] [place] [address] [phone]                          Hospital course 89 year old F with PMH of recurrent C. difficile, paroxysmal A-fib on Eliquis , IDDM-2, HTN, IBS, anxiety and depression resenting with new onset sev LLQ abdominal pain and watery diarrhea, and admitted with C. difficile colitis.  In ED, afebrile without leukocytosis.  C. difficile testing discordant.  Reflex C. difficile PCR pending.  CT abdomen and pelvis with widespread large bowel inflammation compatible with acute colitis from the splenic flexure to the rectum.  Patient was started on p.o. vancomycin  after discussion with GI.     Of note, patient was treated for C. difficile in 10/2023 at 05/2024.   The next day, C. difficile PCR positive for  toxigenic C. difficile with little to no toxin production.  Continued to have diarrhea.  GIP negative.  Blood cultures NGTD.  Patient was continued on p.o. vancomycin  with improvement in his symptoms.  Evaluated by ID who recommended continuing p.o. vancomycin  and discharged home on prolonged p.o. vancomycin  if she continues to improve.  Patient initially agreed to stay overnight after discussion with ID but changed her mind and requested discharge later in the day.   See individual problem list below for more.    Problems addressed during this hospitalization Recurrent C. difficile colitis: Previously treated for C. difficile in 10/2023 at 05/2024.  C. difficile testing and CT abdomen and pelvis as above.  Diarrhea improved.  Still with some tenderness over LLQ. -Discharged on prolonged p.o. vancomycin  taper for 6 weeks per recommendation by ID. -Discontinued PPI.   Microcytic anemia: Hgb down 2 g from baseline but stable.  No melena or hematochezia.  Anemia panel without significant nutritional deficiency. - Check CBC at follow-up.   Paroxysmal A-fib: In sinus rhythm. - Continue home Eliquis .   Essential hypertension: Normotensive for most part - Continue home losartan .   NIDDM-2: A1c 5.1%.  CBG within acceptable range.  On Jardiance  at home. - Home medication.   History of IBS -Continue home Levsin    Neuropathy -  Continue home gabapentin   Mood disorder: Stable. -Continue home meds   Thrombocytopenia: Mild - Check CBC in 1 week   Body mass index is 25.39 kg/m.     Consultations: Gastroenterology Infectious disease    Outpatient Medications Prior to Visit  Medication Sig Dispense Refill   acetaminophen  (TYLENOL ) 500 MG tablet Take 1,000 mg by mouth every 6 (six) hours as needed (body aches / sore hip). Maximum of 6 tablets daily (3000mg )     apixaban  (ELIQUIS ) 2.5 MG TABS tablet TAKE 1 TABLET BY MOUTH TWICE  DAILY 200 tablet 1   [employer] Sunscreen SPF 30 LOTN apply topically  to sun exposed areas Daily; Duration: 30     diclofenac  Sodium (VOLTAREN ) 1 % GEL Apply 2 g topically 4 (four) times daily as needed (back pain.).     dicyclomine  (BENTYL ) 20 MG tablet Take 1 by mouth every 4-6 hours as needed for cramping 30 tablet 1   empagliflozin  (JARDIANCE ) 10 MG TABS tablet TAKE 1 TABLET BY MOUTH DAILY  BEFORE BREAKFAST 90 tablet 3   escitalopram  (LEXAPRO ) 10 MG tablet Take 1 tablet (10 mg total) by mouth daily. 30 tablet 11   gabapentin (NEURONTIN) 100 MG capsule Start with 1 capsule at night.  Advance to 1 capsule twice a day if tolerated for back pain 60 capsule 3   Homeopathic Products (ARNICARE) GEL Apply 1 application  topically daily as needed (skin bruising).     hyoscyamine  (LEVSIN ) 0.125 MG tablet TAKE 1-2 TABLETS EVERY 4 HOURS AS NEEDED FOR UP TO 10 DAYS FOR CRAMPING. 100 tablet 3   LORazepam  (ATIVAN ) 1 MG tablet TAKE ONE TABLET BY MOUTH TWICE DAILY AS NEEDED FOR ANXIETY / SLEEP. 180 tablet 1   losartan  (COZAAR ) 25 MG tablet Take 1 tablet (25 mg total) by mouth daily. 90 tablet 3   nitroGLYCERIN  (NITROSTAT ) 0.4 MG SL tablet Place 1 tablet (0.4 mg total) under the tongue every 5 (five) minutes as needed for chest pain (Call 911 at 3rd dose within 15 minutes.). 20 tablet 3   NON FORMULARY Vitamin c, vitamin d      ondansetron  (ZOFRAN -ODT) 8 MG disintegrating tablet dissolve 1 tablet (8 mg total) in mouth every 8 (eight) hours as needed for nausea or vomiting. 12 tablet 3   Polyethyl Glycol-Propyl Glycol (LUBRICANT EYE DROPS) 0.4-0.3 % SOLN Place 1-2 drops into both eyes at bedtime.     polyethylene glycol (MIRALAX  / GLYCOLAX ) 17 g packet Take 17 g by mouth as needed for mild constipation or moderate constipation.     saccharomyces boulardii (FLORASTOR) 250 MG capsule Take 1 capsule (250 mg total) by mouth 2 (two) times daily. 60 capsule 1   sodium chloride  (OCEAN) 0.65 % SOLN nasal spray Place 1 spray into both nostrils at bedtime as needed for congestion.      valACYclovir  (VALTREX ) 500 MG tablet Take 1 tablet (500 mg total) by mouth daily. Take one tablet by mouth daily for 3 days for an outbreak 30 tablet 0   vancomycin  (VANCOCIN ) 125 MG capsule Take 1 capsule by mouth 4 times daily for 12 days, THEN 1 capsule twice daily for 7 days, THEN 1 capsule daily for 7 days, THEN 1 capsule every other day for 28 days, THEN 1 capsule  every 3 days for 28 days. 92 capsule 0   BIOTIN PO Take 5,000 mcg by mouth in the morning. (Patient not taking: Reported on 07/23/2024)     clobetasol  ointment (TEMOVATE ) 0.05 % Apply to  the affected area in a thin layer twice a day for 2 weeks as needed for a flare.  Then apply to the area twice a week at bedtime as maintenance dosing. (Patient not taking: Reported on 07/23/2024) 60 g 1   Estradiol  0.75 MG/1.25 GM (0.06%) topical gel 1 pump to skin to arm Transdermal Once a day; Duration: 30 day(s) (Patient not taking: Reported on 07/23/2024)     fluorouracil (EFUDEX) 5 % cream 1 application (HOLD FOR SEVERE INFLAMMATION) Externally TO PRECANCERS once a day; Duration: 21 days (Patient not taking: Reported on 07/23/2024)     mupirocin  ointment (BACTROBAN ) 2 % On leg wound w/dressing change qd or bid (Patient not taking: Reported on 07/23/2024) 30 g 1   No facility-administered medications prior to visit.    ROS: Review of Systems  Constitutional:  Positive for fatigue. Negative for activity change, appetite change, chills and unexpected weight change.  HENT:  Negative for congestion, mouth sores and sinus pressure.   Eyes:  Negative for visual disturbance.  Respiratory:  Negative for cough and chest tightness.   Gastrointestinal:  Negative for abdominal pain and nausea.  Genitourinary:  Negative for difficulty urinating, frequency and vaginal pain.  Musculoskeletal:  Positive for back pain. Negative for gait problem.  Skin:  Negative for pallor and rash.  Neurological:  Negative for dizziness, tremors, weakness, numbness and  headaches.  Psychiatric/Behavioral:  Negative for confusion and sleep disturbance.     Objective:  BP 118/74   Pulse 77   Ht 5' 4 (1.626 m)   Wt 145 lb (65.8 kg)   SpO2 96%   BMI 24.89 kg/m   BP Readings from Last 3 Encounters:  07/23/24 118/74  07/21/24 122/78  07/13/24 118/74    Wt Readings from Last 3 Encounters:  07/23/24 145 lb (65.8 kg)  07/19/24 147 lb 14.4 oz (67.1 kg)  06/03/24 142 lb 4 oz (64.5 kg)    Physical Exam Constitutional:      General: She is not in acute distress.    Appearance: Normal appearance. She is well-developed.  HENT:     Head: Normocephalic.     Right Ear: External ear normal.     Left Ear: External ear normal.     Nose: Nose normal.  Eyes:     General:        Right eye: No discharge.        Left eye: No discharge.     Conjunctiva/sclera: Conjunctivae normal.     Pupils: Pupils are equal, round, and reactive to light.  Neck:     Thyroid : No thyromegaly.     Vascular: No JVD.     Trachea: No tracheal deviation.  Cardiovascular:     Rate and Rhythm: Normal rate and regular rhythm.     Heart sounds: Normal heart sounds.  Pulmonary:     Effort: No respiratory distress.     Breath sounds: No stridor. No wheezing.  Abdominal:     General: Bowel sounds are normal. There is no distension.     Palpations: Abdomen is soft. There is no mass.     Tenderness: There is no abdominal tenderness. There is no guarding or rebound.  Musculoskeletal:        General: Tenderness present.     Cervical back: Normal range of motion and neck supple. No rigidity.  Lymphadenopathy:     Cervical: No cervical adenopathy.  Skin:    Findings: No erythema or rash.  Neurological:  Cranial Nerves: No cranial nerve deficit.     Motor: No abnormal muscle tone.     Coordination: Coordination normal.     Deep Tendon Reflexes: Reflexes normal.  Psychiatric:        Behavior: Behavior normal.        Thought Content: Thought content normal.        Judgment:  Judgment normal.    Lumbar spine tender to palpation Lab Results  Component Value Date   WBC 5.2 07/21/2024   HGB 10.4 (L) 07/21/2024   HCT 33.0 (L) 07/21/2024   PLT 143 (L) 07/21/2024   GLUCOSE 78 07/21/2024   CHOL 157 02/26/2024   TRIG 64.0 02/26/2024   HDL 84.10 02/26/2024   LDLDIRECT 89.1 06/29/2013   LDLCALC 60 02/26/2024   ALT 7 07/19/2024   AST 21 07/19/2024   NA 138 07/21/2024   K 4.4 07/21/2024   CL 107 07/21/2024   CREATININE 0.91 07/21/2024   BUN 14 07/21/2024   CO2 22 07/21/2024   TSH 3.49 02/26/2024   INR 1.2 02/07/2023   HGBA1C 5.1 07/19/2024    CT ABDOMEN PELVIS W CONTRAST Result Date: 07/19/2024 EXAM: CT ABDOMEN AND PELVIS WITH CONTRAST 07/19/2024 10:36:15 AM TECHNIQUE: CT of the abdomen and pelvis was performed with the administration of 80 mL of iohexol  (OMNIPAQUE ) 300 MG/ML solution. Multiplanar reformatted images are provided for review. Automated exposure control, iterative reconstruction, and/or weight-based adjustment of the mA/kV was utilized to reduce the radiation dose to as low as reasonably achievable. COMPARISON: CT abdomen and pelvis 04/21/2024. CLINICAL HISTORY: 89 year old female with LLQ abdominal pain. FINDINGS: LOWER CHEST: Stable mild cardiomegaly. No pericardial or pleural effusion. Small chronic Bochdalek hernia on the right is stable. Superimposed bilateral lung bases atelectasis and scarring is stable. LIVER: The liver is unremarkable. GALLBLADDER AND BILE DUCTS: Gallbladder appears more distended compared to 04/21/2024 but otherwise within normal limits. No biliary ductal dilatation. SPLEEN: No acute abnormality. PANCREAS: Pancreatic atrophy. ADRENAL GLANDS: No acute abnormality. KIDNEYS, URETERS AND BLADDER: Exophytic simple fluid density left renal upper pole cyst. Per consensus, no follow-up is needed for simple Bosniak type 1 and 2 renal cysts, unless the patient has a malignancy history or risk factors. Bilateral renal enhancement and  contrast excretion within normal limits. Diminutive bladder. No stones in the kidneys or ureters. No hydronephrosis. No perinephric or periureteral stranding. GI AND BOWEL: Large bowel appears indistinct, mildly thickened and inflamed from the splenic flexure, throughout the descending colon, throughout the sigmoid colon and to the rectum. Pronounced sigmoid wall thickening up to 14 mm on series 2 image 52. Relatively mild associated mesenteric inflammatory stranding throughout those segments. Fairly widespread diverticulosis throughout those segments. Upstream transverse colon has a more normal appearance, is redundant. No right colon inflammation identified. Nondilated small bowel. Decompressed stomach and duodenum. Some right colon diverticulosis. Diminutive or absent appendix is not identified. There is no bowel obstruction. PERITONEUM AND RETROPERITONEUM: No ascites. No free air. VASCULATURE: Aorta is normal in caliber. Chronic tortuosity of the visible aorta. Moderately advanced calcified aortoiliac atherosclerosis. Major arterial structures and the portal venous system appear patent. LYMPH NODES: No lymphadenopathy. REPRODUCTIVE ORGANS: No acute abnormality. BONES AND SOFT TISSUES: Osteopenia. Advanced lumbar spine degeneration superimposed on moderate thoracolumbar scoliosis. Chronic T10 compression fracture partially visible. No acute osseous abnormality. No focal soft tissue abnormality. IMPRESSION: 1. Widespread large bowel inflammation compatible with Acute colitis from the splenic flexure to the rectum. Pronounced sigmoid wall thickening (up to 14 mm). No obstruction,  abscess, or other complicating features. 2. Advanced aortoiliac atherosclerosis and spine degeneration. Stable mild cardiomegaly. Electronically signed by: Helayne Hurst MD 07/19/2024 10:59 AM EST RP Workstation: [id number]   DG Chest 2 View Result Date: 07/19/2024 EXAM: 2 VIEW(S) XRAY OF THE CHEST 07/19/2024 09:22:00 AM COMPARISON:  02/07/2023 CLINICAL HISTORY: SOB FINDINGS: LUNGS AND PLEURA: No focal pulmonary opacity. No pulmonary edema. No pleural effusion. No pneumothorax. HEART AND MEDIASTINUM: Calcified aorta. BONES AND SOFT TISSUES: Thoracic degenerative changes. Old left humeral neck fracture. IMPRESSION: 1. No acute cardiopulmonary process. Electronically signed by: Waddell Calk MD 07/19/2024 09:40 AM EST RP Workstation: [id number]    Assessment & Plan:   Problem List Items Addressed This Visit     Anemia, iron  deficiency   Iron  rich foods      Relevant Orders   Comprehensive metabolic panel with GFR   CBC with Differential/Platelet   Iron , TIBC and Ferritin Panel   Anxiety - Primary   On Lexapro  10 mg at hs      C. difficile colitis   10/2023, 05/2024, 07/2024 On oral vancomycin  Will use Vowst po after vanco is finished (if covered) F/u w/Dr Shila On probiotics      Relevant Orders   Comprehensive metabolic panel with GFR   CBC with Differential/Platelet   Chronic kidney disease, stage 3b (HCC)   Hydrate well         Meds ordered this encounter  Medications   Fecal Microb Spores, Live-brpk (VOWST) CAPS    Sig: Take 4 capsules by mouth 2 (two) times daily for 3 days.    Dispense:  12 capsule    Refill:  0    Take in 2 weeks after you finish oral Vancomycin . Dx: 3 episodes of C diff colitis in 2025   ondansetron  (ZOFRAN ) 4 MG tablet    Sig: Take 1 tablet (4 mg total) by mouth every 8 (eight) hours as needed for nausea or vomiting.    Dispense:  21 tablet    Refill:  1   fluconazole  (DIFLUCAN ) 150 MG tablet    Sig: Take 1 tablet (150 mg total) by mouth once for 1 dose.    Dispense:  1 tablet    Refill:  0      Follow-up: Return in about 3 months (around 10/23/2024) for a follow-up visit.  Marolyn Noel, MD

## 2024-07-23 NOTE — Assessment & Plan Note (Signed)
Iron rich foods

## 2024-07-23 NOTE — Assessment & Plan Note (Signed)
 On Lexapro  10 mg at hs

## 2024-07-23 NOTE — Patient Instructions (Signed)
Lucky iron fish

## 2024-07-23 NOTE — Assessment & Plan Note (Signed)
 10/2023, 05/2024, 07/2024 On oral vancomycin  Will use Vowst po after vanco is finished (if covered) F/u w/Dr Shila On probiotics

## 2024-07-23 NOTE — Assessment & Plan Note (Signed)
 Hydrate well

## 2024-07-24 LAB — CULTURE, BLOOD (ROUTINE X 2)
Culture: NO GROWTH
Culture: NO GROWTH
Special Requests: ADEQUATE
Special Requests: ADEQUATE

## 2024-07-26 ENCOUNTER — Encounter: Payer: Self-pay | Admitting: Gastroenterology

## 2024-07-30 ENCOUNTER — Ambulatory Visit: Admitting: Gastroenterology

## 2024-07-30 ENCOUNTER — Encounter: Payer: Self-pay | Admitting: Gastroenterology

## 2024-07-30 VITALS — BP 100/74 | HR 67 | Ht 63.0 in | Wt 142.0 lb

## 2024-07-30 DIAGNOSIS — A0472 Enterocolitis due to Clostridium difficile, not specified as recurrent: Secondary | ICD-10-CM

## 2024-07-30 MED ORDER — SACCHAROMYCES BOULARDII 250 MG PO CAPS: 250.0000 mg | ORAL_CAPSULE | Freq: Two times a day (BID) | ORAL | 3 refills | Status: AC

## 2024-07-30 MED ORDER — FAMOTIDINE 20 MG PO TABS: 20.0000 mg | ORAL_TABLET | Freq: Two times a day (BID) | ORAL | 3 refills | Status: AC

## 2024-07-30 NOTE — Patient Instructions (Addendum)
 Complete Vancomycin  taper.   Increase Florastor to twice daily.   Start Famotidine  20 mg twice daily.   Keep follow up with Dr. Nandigam.

## 2024-07-30 NOTE — Progress Notes (Signed)
 07/30/2024 [name] [id number] 25-May-1935   Discussed the use of AI scribe software for clinical note transcription with the patient, who gave verbal consent to proceed.  History of Present Illness Hailey [name] is an 89 year old female with recurrent Clostridioides difficile infection who presents for follow-up.  She is a patient of Dr. Trenna.  She is currently on a vancomycin  taper regimen, starting with four doses per day, with plans to reduce to two per day, then one per day, and eventually one every couple of days. This regimen was initiated after her second confirmed C. diff infection this year, with a previous episode suspected in February 2025 but not confirmed. Tested positive in September and was treated with Vancomycin  for 10 days.  She was hospitalized recently, where the taper was started, still on 4 times a day dosing.  She has experienced severe heartburn over the past two nights, starting in the upper chest and radiating down the esophagus. The first episode followed consumption of tomato-based soup, but the second occurred after eating bland food. She uses Pepcid  Complete as needed, which provides some relief.  Was previously on esomeprazole , but that was discontinued during her hospitalization earlier this month.  She has back pain radiating down her leg, which she associates with arthritis. She has consulted with a DO for this issue and has been advised to start magnesium  supplements. She is concerned about potential diarrhea as a side effect. She has not experienced diarrhea recently, reporting two formed bowel movements per day since on the vancomycin , but does have occasional stomach cramping.  Her current medications include vancomycin , Florastor once daily (but it was recommended BID), and Pepcid  Complete as needed for heartburn.   Past Medical History:  Diagnosis Date   Acute blood loss anemia 12/25/2017   Allergic rhinitis    Anxiety    Arthritis     my whole spine (07/01/2017)   Arthritis    Atrial fibrillation (HCC)    Bowel obstruction (HCC)    in [place]    Bradycardia with 41-50 beats per minute 12/27/2017   Cancer (HCC)    Cellulitis of leg, right with large prepatella hematoma and open wounds 12/26/2017   CKD (chronic kidney disease) stage 2, GFR 60-89 ml/min    Colon polyps    Coronary artery disease    10/18 PCI/DES to p/m LCx with cutting balloon to mLcx   Diverticulosis of colon    GERD (gastroesophageal reflux disease)    Hip bursitis 2010   Dr Jacklin, Post op seroma   History of colon polyps    HSV (herpes simplex virus) anogenital infection 07/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   HTN (hypertension)    IBS (irritable bowel syndrome)    constipation predominant - Dr Luis   Lichen sclerosus    Osteopenia 11/2016   T score -2.0 FRAX 15%/4.3%   PAC (premature atrial contraction)    Symptomatiic   Renal insufficiency    Right bundle branch block (RBBB) on electrocardiogram (ECG) 12/27/2017   Scoliosis    SVT (supraventricular tachycardia)    brief history   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   biopsy showing vulvar atypia, possible VIN I   Past Surgical History:  Procedure Laterality Date   ANTERIOR AND POSTERIOR VAGINAL REPAIR  01/2002   thelbert 01/23/2011   APPENDECTOMY  1948   CARDIAC CATHETERIZATION  06/26/2017   CORONARY ANGIOPLASTY WITH STENT PLACEMENT  07/01/2017  CORONARY ATHERECTOMY N/A 07/01/2017   Procedure: CORONARY ATHERECTOMY;  Surgeon: Claudene Victory ORN, MD;  Location: [facility] INVASIVE CV LAB;  Service: Cardiovascular;  Laterality: N/A;   CORONARY STENT INTERVENTION N/A 07/01/2017   Procedure: CORONARY STENT INTERVENTION;  Surgeon: Claudene Victory ORN, MD;  Location: [facility] INVASIVE CV LAB;  Service: Cardiovascular;  Laterality: N/A;   HAMMER TOE SURGERY     HEMORRHOID BANDING     HIP SURGERY Left 04/2009   hip examination under anesthesia followed by greater trochanteric bursectomy;  iliotibial band tenotomy/notes 01/20/2011   I & D EXTREMITY Right 01/10/2018   Procedure: IRRIGATION AND DEBRIDEMENT RIGHT KNEE, APPLY WOUND VAC;  Surgeon: Harden Jerona GAILS, MD;  Location: [facility] OR;  Service: Orthopedics;  Laterality: Right;   KNEE BURSECTOMY Right 04/2009   thelbert 01/09/2011   LEFT ATRIAL APPENDAGE OCCLUSION N/A 08/24/2021   Procedure: LEFT ATRIAL APPENDAGE OCCLUSION;  Surgeon: Wonda Sharper, MD;  Location: [facility] INVASIVE CV LAB;  Service: Cardiovascular;  Laterality: N/A;   LEFT HEART CATH AND CORONARY ANGIOGRAPHY N/A 06/26/2017   Procedure: LEFT HEART CATH AND CORONARY ANGIOGRAPHY;  Surgeon: Claudene Victory ORN, MD;  Location: [facility] INVASIVE CV LAB;  Service: Cardiovascular;  Laterality: N/A;   PUBOVAGINAL SLING  01/2002   thelbert 01/23/2011   REDUCTION MAMMAPLASTY     TEE WITHOUT CARDIOVERSION N/A 08/24/2021   Procedure: TRANSESOPHAGEAL ECHOCARDIOGRAM (TEE);  Surgeon: Wonda Sharper, MD;  Location[facility] INVASIVE CV LAB;  Service: Cardiovascular;  Laterality: N/A;   TEMPORARY PACEMAKER N/A 07/01/2017   Procedure: TEMPORARY PACEMAKER;  Surgeon: Claudene Victory ORN, MD;  Location: [facility] INVASIVE CV LAB;  Service: Cardiovascular;  Laterality: N/A;   VAGINAL HYSTERECTOMY  01/2002   Vaginal hysterectomy, bilateral salpingo-oophorectomy/notes 01/23/2011    reports that she has quit smoking. Her smoking use included cigarettes. She has never used smokeless tobacco. She reports current alcohol use. She reports that she does not use drugs. family history includes Colon cancer in her mother; Diabetes in her father; Pancreatic cancer in her brother; Prostate cancer in her brother and father; Stomach cancer in her son. Allergies  Allergen Reactions   Macrobid  [Nitrofurantoin  Monohyd Macro] Other (See Comments)    Nausea, stomach cramps, fatigue , headache.   Meloxicam  Other (See Comments)    Jittery and headache   Digoxin  And Related Other (See Comments)    headaches   Diprolene  [Betamethasone  Dipropionate  Aug] Other (See Comments)    Patient states that the bruising was made worse when using this   Ferrous Sulfate Other (See Comments)    Bad constipation    Keflex  [Cephalexin ] Nausea And Vomiting    Nausea, fatigue, and headache.   Lipitor  [Atorvastatin ]     Abd pain   Mobic  [Meloxicam ] Other (See Comments)    Unsure of reaction type   Sulfamethoxazole -Trimethoprim  Nausea Only      Outpatient Encounter Medications as of 07/30/2024  Medication Sig   acetaminophen  (TYLENOL ) 500 MG tablet Take 1,000 mg by mouth every 6 (six) hours as needed (body aches / sore hip). Maximum of 6 tablets daily (3000mg )   apixaban  (ELIQUIS ) 2.5 MG TABS tablet TAKE 1 TABLET BY MOUTH TWICE  DAILY   [employer] Sunscreen SPF 30 LOTN apply topically to sun exposed areas Daily; Duration: 30   diclofenac  Sodium (VOLTAREN ) 1 % GEL Apply 2 g topically 4 (four) times daily as needed (back pain.).   dicyclomine  (BENTYL ) 20 MG tablet Take 1 by mouth every 4-6 hours as needed for cramping   escitalopram  (LEXAPRO )  10 MG tablet Take 1 tablet (10 mg total) by mouth daily.   Homeopathic Products (ARNICARE) GEL Apply 1 application  topically daily as needed (skin bruising).   hyoscyamine  (LEVSIN ) 0.125 MG tablet TAKE 1-2 TABLETS EVERY 4 HOURS AS NEEDED FOR UP TO 10 DAYS FOR CRAMPING.   LORazepam  (ATIVAN ) 1 MG tablet TAKE ONE TABLET BY MOUTH TWICE DAILY AS NEEDED FOR ANXIETY / SLEEP.   losartan  (COZAAR ) 25 MG tablet Take 1 tablet (25 mg total) by mouth daily.   nitroGLYCERIN  (NITROSTAT ) 0.4 MG SL tablet Place 1 tablet (0.4 mg total) under the tongue every 5 (five) minutes as needed for chest pain (Call 911 at 3rd dose within 15 minutes.).   NON FORMULARY Vitamin c, vitamin d    ondansetron  (ZOFRAN ) 4 MG tablet Take 1 tablet (4 mg total) by mouth every 8 (eight) hours as needed for nausea or vomiting.   ondansetron  (ZOFRAN -ODT) 8 MG disintegrating tablet dissolve 1 tablet (8 mg total) in mouth every 8 (eight) hours as needed for nausea  or vomiting.   Polyethyl Glycol-Propyl Glycol (LUBRICANT EYE DROPS) 0.4-0.3 % SOLN Place 1-2 drops into both eyes at bedtime.   polyethylene glycol (MIRALAX  / GLYCOLAX ) 17 g packet Take 17 g by mouth as needed for mild constipation or moderate constipation.   saccharomyces boulardii (FLORASTOR) 250 MG capsule Take 1 capsule (250 mg total) by mouth 2 (two) times daily.   sodium chloride  (OCEAN) 0.65 % SOLN nasal spray Place 1 spray into both nostrils at bedtime as needed for congestion.   valACYclovir  (VALTREX ) 500 MG tablet Take 1 tablet (500 mg total) by mouth daily. Take one tablet by mouth daily for 3 days for an outbreak   vancomycin  (VANCOCIN ) 125 MG capsule Take 1 capsule by mouth 4 times daily for 12 days, THEN 1 capsule twice daily for 7 days, THEN 1 capsule daily for 7 days, THEN 1 capsule every other day for 28 days, THEN 1 capsule  every 3 days for 28 days.   [DISCONTINUED] empagliflozin  (JARDIANCE ) 10 MG TABS tablet TAKE 1 TABLET BY MOUTH DAILY  BEFORE BREAKFAST   [DISCONTINUED] gabapentin (NEURONTIN) 100 MG capsule Start with 1 capsule at night.  Advance to 1 capsule twice a day if tolerated for back pain   No facility-administered encounter medications on file as of 07/30/2024.     REVIEW OF SYSTEMS  : All other systems reviewed and negative except where noted in the History of Present Illness.   PHYSICAL EXAM: BP 100/74   Pulse 67   Ht 5' 3 (1.6 m)   Wt 142 lb (64.4 kg)   BMI 25.15 kg/m  General: Well developed white female in no acute distress Head: Normocephalic and atraumatic Eyes:  Sclerae anicteric, conjunctiva pink. Ears: Normal auditory acuity Lungs: Clear throughout to auscultation; no W/R/R. Heart: Regular rate and rhythm; no M/R/G. Abdomen: Soft, non-distended.  BS present.  Minimal lower abdominal TTP. Musculoskeletal: Symmetrical with no gross deformities  Skin: No lesions on visible extremities Extremities: No edema  Neurological: Alert oriented x 4,  grossly non-focal Psychological:  Alert and cooperative. Normal mood and affect Assessment & Plan Recurrent Clostridioides difficile infection Recurrent C. difficile infection with suspected episode in February 2025, but not sure that she was treated at that time and then September 2025, and now with another positive earlier this month. Currently on a vancomycin  taper to prevent recurrence. Previous treatment with vancomycin  for 10 days. PCP wrote prescription for Vowst (oral fecal microbiota transplant), but  discussed waiting to do that if recurrence occurs after taper. - Continue vancomycin  taper as planned. - Increase Florastor to twice daily to replenish gut flora (currently taking once a day but was recommended BID). - Hold off on Vowst unless recurrence occurs after taper. - Monitor for recurrence of symptoms and report if this occurs. - Keep already scheduled follow-up with Dr. Nandigam next month.  Heartburn Recent onset of severe heartburn, possibly related to vancomycin  use. Previously on esomeprazole , which was discontinued during hospitalization. Discussed potential link between PPI use and C. difficile recurrence. Considering famotidine  as an alternative to manage heartburn while on vancomycin  and possibly even long term if it helps. She reports heartburn lasting about 30 minutes, relieved by sitting up and deep breathing. - Take famotidine  (Pepcid ) 20 mg twice daily, preferably in the morning and evening.  Prescription sent to pharmacy. - Avoid esomeprazole  due to potential risk of C. difficile recurrence.   CC:  Plotnikov, Aleksei V, MD

## 2024-08-02 ENCOUNTER — Encounter: Payer: Self-pay | Admitting: Gastroenterology

## 2024-08-05 ENCOUNTER — Ambulatory Visit: Admitting: Family Medicine

## 2024-08-05 ENCOUNTER — Ambulatory Visit: Payer: Self-pay

## 2024-08-05 ENCOUNTER — Encounter: Payer: Self-pay | Admitting: Family Medicine

## 2024-08-05 VITALS — BP 110/80 | HR 67 | Temp 97.4°F | Resp 16 | Ht 63.0 in | Wt 142.2 lb

## 2024-08-05 DIAGNOSIS — J069 Acute upper respiratory infection, unspecified: Secondary | ICD-10-CM

## 2024-08-05 DIAGNOSIS — R059 Cough, unspecified: Secondary | ICD-10-CM | POA: Diagnosis not present

## 2024-08-05 LAB — POCT INFLUENZA A/B
Influenza A, POC: NEGATIVE
Influenza B, POC: NEGATIVE

## 2024-08-05 LAB — POC COVID19 BINAXNOW: SARS Coronavirus 2 Ag: NEGATIVE

## 2024-08-05 MED ORDER — IPRATROPIUM BROMIDE 0.06 % NA SOLN: 2.0000 | Freq: Four times a day (QID) | NASAL | 0 refills | Status: AC

## 2024-08-05 MED ORDER — BENZONATATE 100 MG PO CAPS: 100.0000 mg | ORAL_CAPSULE | Freq: Two times a day (BID) | ORAL | 0 refills | Status: AC | PRN

## 2024-08-05 NOTE — Patient Instructions (Addendum)
 A few things to remember from today's visit:  Cough, unspecified type - Plan: POC COVID-19, POC Influenza A/B, benzonatate  (TESSALON ) 100 MG capsule  URI, acute - Plan: ipratropium (ATROVENT ) 0.06 % nasal spray  Monitor for new symptoms. Adequate hydration and rest. Repeat covid test tomorrow and monitor for fever.  Do not use My Chart to request refills or for acute issues that need immediate attention. If you send a my chart message, it may take a few days to be addressed, specially if I am not in the office.  Please be sure medication list is accurate. If a new problem present, please set up appointment sooner than planned today.

## 2024-08-05 NOTE — Telephone Encounter (Signed)
 FYI Only or Action Required?: Action required by provider: request for appointment.  Patient was last seen in primary care on 07/23/2024 by Plotnikov, [name], MD.  Called Nurse Triage reporting Cough.  Symptoms began several days ago.  Interventions attempted: OTC medications: Delsym.  Symptoms are: unchanged.Cough with runny nose, sore throat.  Triage Disposition: See Physician Within 24 Hours  Patient/caregiver understands and will follow disposition?: Yes      Copied from CRM [id number]. Topic: Clinical - Red Word Triage >> Aug 05, 2024  9:14 AM Suzen RAMAN wrote: Red Word that prompted transfer to Nurse Triage: congestion ,productive cough and headache, requesting recommendations or a medication to be called into the pharmacy Reason for Disposition  [1] Continuous (nonstop) coughing interferes with work or school AND [2] no improvement using cough treatment per Care Advice  Answer Assessment - Initial Assessment Questions 1. ONSET: When did the cough begin?      yesterday 2. SEVERITY: How bad is the cough today?      moderate 3. SPUTUM: Describe the color of your sputum (e.g., none, dry cough; clear, white, yellow, green)     clear 4. HEMOPTYSIS: Are you coughing up any blood? If Yes, ask: How much? (e.g., flecks, streaks, tablespoons, etc.)     no 5. DIFFICULTY BREATHING: Are you having difficulty breathing? If Yes, ask: How bad is it? (e.g., mild, moderate, severe)      no 6. FEVER: Do you have a fever? If Yes, ask: What is your temperature, how was it measured, and when did it start?     no 7. CARDIAC HISTORY: Do you have any history of heart disease? (e.g., heart attack, congestive heart failure)      Afib 8. LUNG HISTORY: Do you have any history of lung disease?  (e.g., pulmonary embolus, asthma, emphysema)     no 9. PE RISK FACTORS: Do you have a history of blood clots? (or: recent major surgery, recent prolonged travel, bedridden)      no 10. OTHER SYMPTOMS: Do you have any other symptoms? (e.g., runny nose, wheezing, chest pain)       Runny nose, sore throat 11. PREGNANCY: Is there any chance you are pregnant? When was your last menstrual period?       no 12. TRAVEL: Have you traveled out of the country in the last month? (e.g., travel history, exposures)       no  Protocols used: Cough - Acute Non-Productive-A-AH

## 2024-08-05 NOTE — Progress Notes (Signed)
 ACUTE VISIT Chief Complaint  Patient presents with   Cough    Discussed the use of AI scribe software for clinical note transcription with the patient, who gave verbal consent to proceed.  History of Present Illness Hailey [name] is an 89 year old female with past medical history significant for atrial fibrillation on chronic anticoagulation, C. difficile colitis, GERD, IBS, OA,CKD III who is here today complaining of a day of cough.  She experienced the onset of a cough starting yesterday, accompanied by post nasal drainage, nasal congestion, and a frontal headache.  No fever, chills, unusual body aches. She notes a decreased appetite compared to her usual.  No chest pain, difficulty breathing, wheezing, nausea, vomiting, or  changes in bowel habits. Symptoms are stable.  Her husband recently experienced similar symptoms and is currently recovering.  She has not performed COVID 19 or flu test at home.  She has been managing her symptoms with Tylenol  and Delsym syrup, which she took last night and noted it helped with cough during the night.  Review of Systems  Constitutional:  Positive for activity change.  HENT:  Negative for ear pain, mouth sores and sore throat.   Eyes:  Negative for discharge and redness.  Respiratory:  Positive for cough.   Gastrointestinal:  Negative for abdominal pain.  Genitourinary:  Negative for decreased urine volume, dysuria and hematuria.  Skin:  Negative for rash.  Allergic/Immunologic: Positive for environmental allergies.  Neurological:  Negative for syncope, facial asymmetry and weakness.  See other pertinent positives and negatives in HPI.  Current Outpatient Medications on File Prior to Visit  Medication Sig Dispense Refill   acetaminophen  (TYLENOL ) 500 MG tablet Take 1,000 mg by mouth every 6 (six) hours as needed (body aches / sore hip). Maximum of 6 tablets daily (3000mg )     apixaban  (ELIQUIS ) 2.5 MG TABS tablet TAKE 1 TABLET BY  MOUTH TWICE  DAILY 200 tablet 1   [employer] Sunscreen SPF 30 LOTN apply topically to sun exposed areas Daily; Duration: 30     diclofenac  Sodium (VOLTAREN ) 1 % GEL Apply 2 g topically 4 (four) times daily as needed (back pain.).     dicyclomine  (BENTYL ) 20 MG tablet Take 1 by mouth every 4-6 hours as needed for cramping 30 tablet 1   escitalopram  (LEXAPRO ) 10 MG tablet Take 1 tablet (10 mg total) by mouth daily. 30 tablet 11   famotidine  (PEPCID ) 20 MG tablet Take 1 tablet (20 mg total) by mouth 2 (two) times daily. 180 tablet 3   Homeopathic Products (ARNICARE) GEL Apply 1 application  topically daily as needed (skin bruising).     hyoscyamine  (LEVSIN ) 0.125 MG tablet TAKE 1-2 TABLETS EVERY 4 HOURS AS NEEDED FOR UP TO 10 DAYS FOR CRAMPING. 100 tablet 3   LORazepam  (ATIVAN ) 1 MG tablet TAKE ONE TABLET BY MOUTH TWICE DAILY AS NEEDED FOR ANXIETY / SLEEP. 180 tablet 1   losartan  (COZAAR ) 25 MG tablet Take 1 tablet (25 mg total) by mouth daily. 90 tablet 3   nitroGLYCERIN  (NITROSTAT ) 0.4 MG SL tablet Place 1 tablet (0.4 mg total) under the tongue every 5 (five) minutes as needed for chest pain (Call 911 at 3rd dose within 15 minutes.). 20 tablet 3   NON FORMULARY Vitamin c, vitamin d      ondansetron  (ZOFRAN ) 4 MG tablet Take 1 tablet (4 mg total) by mouth every 8 (eight) hours as needed for nausea or vomiting. 21 tablet 1   ondansetron  (ZOFRAN -ODT)  8 MG disintegrating tablet dissolve 1 tablet (8 mg total) in mouth every 8 (eight) hours as needed for nausea or vomiting. 12 tablet 3   Polyethyl Glycol-Propyl Glycol (LUBRICANT EYE DROPS) 0.4-0.3 % SOLN Place 1-2 drops into both eyes at bedtime.     polyethylene glycol (MIRALAX  / GLYCOLAX ) 17 g packet Take 17 g by mouth as needed for mild constipation or moderate constipation.     saccharomyces boulardii (FLORASTOR) 250 MG capsule Take 1 capsule (250 mg total) by mouth 2 (two) times daily. 60 capsule 3   sodium chloride  (OCEAN) 0.65 % SOLN nasal spray Place 1  spray into both nostrils at bedtime as needed for congestion.     valACYclovir  (VALTREX ) 500 MG tablet Take 1 tablet (500 mg total) by mouth daily. Take one tablet by mouth daily for 3 days for an outbreak 30 tablet 0   vancomycin  (VANCOCIN ) 125 MG capsule Take 1 capsule by mouth 4 times daily for 12 days, THEN 1 capsule twice daily for 7 days, THEN 1 capsule daily for 7 days, THEN 1 capsule every other day for 28 days, THEN 1 capsule  every 3 days for 28 days. 92 capsule 0   No current facility-administered medications on file prior to visit.    Past Medical History:  Diagnosis Date   Acute blood loss anemia 12/25/2017   Allergic rhinitis    Anxiety    Arthritis    my whole spine (07/01/2017)   Arthritis    Atrial fibrillation (HCC)    Bowel obstruction (HCC)    in [place]    Bradycardia with 41-50 beats per minute 12/27/2017   Cancer (HCC)    Cellulitis of leg, right with large prepatella hematoma and open wounds 12/26/2017   CKD (chronic kidney disease) stage 2, GFR 60-89 ml/min    Colon polyps    Coronary artery disease    10/18 PCI/DES to p/m LCx with cutting balloon to mLcx   Diverticulosis of colon    GERD (gastroesophageal reflux disease)    Hip bursitis 2010   Dr Jacklin, Post op seroma   History of colon polyps    HSV (herpes simplex virus) anogenital infection 07/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   HTN (hypertension)    IBS (irritable bowel syndrome)    constipation predominant - Dr Luis   Lichen sclerosus    Osteopenia 11/2016   T score -2.0 FRAX 15%/4.3%   PAC (premature atrial contraction)    Symptomatiic   Renal insufficiency    Right bundle branch block (RBBB) on electrocardiogram (ECG) 12/27/2017   Scoliosis    SVT (supraventricular tachycardia)    brief history   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   Meloxicam  Other (See Comments)    Jittery and headache   Digoxin  And Related Other (See Comments)    headaches   Diprolene  [Betamethasone  Dipropionate Aug] Other (See Comments)    Patient states that the bruising was made worse when using this   Ferrous Sulfate Other (See Comments)    Bad constipation    Keflex  [Cephalexin ] Nausea And Vomiting    Nausea, fatigue, and headache.   Lipitor  [Atorvastatin ]     Abd pain   Mobic  [Meloxicam ] Other (See Comments)    Unsure of reaction type  Sulfamethoxazole -Trimethoprim  Nausea Only    Social History   Socioeconomic History   Marital status: Married    Spouse name: [name]   Number of children: 2   Years of education: Not on file   Highest education level: Bachelor's degree (e.g., BA, AB, BS)  Occupational History   Occupation: Engineer, [employer]: RETIRED  Tobacco Use   Smoking status: Former    Types: Cigarettes   Smokeless tobacco: Never  Vaping Use   Vaping status: Never Used  Substance and Sexual Activity   Alcohol use: Yes    Comment: moderatley [place] before dinner   Drug use: Never   Sexual activity: Not Currently    Birth control/protection: Surgical    Comment: hysterectomy; less than 5, IC after 18, no STD, no abnormal paps  Other Topics Concern   Not on file  Social History Narrative   ** Merged History Encounter ** Regular Exercise -  YES      Lives at home with husband/2025   Social Drivers of Health   Financial Resource Strain: Low Risk  (11/22/2023)   Overall Financial Resource Strain (CARDIA)    Difficulty of Paying Living Expenses: Not hard at all  Food Insecurity: No Food Insecurity (07/22/2024)   Hunger Vital Sign    Worried About Running Out of Food in the Last Year: Never true    Ran Out of Food in the Last Year: Never true  Transportation Needs: No Transportation Needs (07/22/2024)   PRAPARE - Scientist, Research (physical Sciences) (Medical): No    Lack of Transportation (Non-Medical): No  Physical Activity: Insufficiently Active (11/22/2023)   Exercise Vital Sign    Days of Exercise per Week: 2 days    Minutes of Exercise per Session: 60 min  Stress: No Stress Concern Present (11/22/2023)   [employer] of Occupational Health - Occupational Stress Questionnaire    Feeling of Stress : Only a little  Social Connections: Moderately Isolated (07/19/2024)   Social Connection and Isolation Panel    Frequency of Communication with Friends and Family: Three times a week    Frequency of Social Gatherings with Friends and Family: Three times a week    Attends Religious Services: Never    Active Member of Clubs or Organizations: No    Attends Banker Meetings: Never    Marital Status: Married   Vitals:   08/05/24 1438  BP: 110/80  Pulse: 67  Resp: 16  Temp: (!) 97.4 F (36.3 C)  SpO2: 94%   Body mass index is 25.19 kg/m.  Physical Exam Vitals and nursing note reviewed.  Constitutional:      General: She is not in acute distress.    Appearance: She is well-developed. She is not ill-appearing.  HENT:     Head: Normocephalic and atraumatic.     Right Ear: Tympanic membrane, ear canal and external ear normal.     Left Ear: Tympanic membrane, ear canal and external ear normal.     Nose: Rhinorrhea present.     Right Sinus: No maxillary sinus tenderness or frontal sinus tenderness.     Left Sinus: No maxillary sinus tenderness or frontal sinus tenderness.     Mouth/Throat:     Pharynx: Postnasal drip present. No oropharyngeal exudate or posterior oropharyngeal erythema.  Eyes:     Conjunctiva/sclera: Conjunctivae normal.  Cardiovascular:     Rate and Rhythm: Normal rate and regular rhythm.     Heart sounds: No  murmur heard. Pulmonary:     Effort: Pulmonary effort is normal. No respiratory distress.     Breath sounds: Normal breath sounds. No stridor.  Lymphadenopathy:     Head:      Right side of head: No submandibular adenopathy.     Left side of head: No submandibular adenopathy.     Cervical: No cervical adenopathy.  Skin:    General: Skin is warm.     Findings: No erythema or rash.  Neurological:     Mental Status: She is alert and oriented to person, place, and time.  Psychiatric:        Mood and Affect: Mood and affect normal.   ASSESSMENT AND PLAN:  Ms. Daggs was seen today for a day of cough.  URI, acute Symptoms just started yesterday, most likely viral etiology, so symptomatic treatment recommended. Hx and examination do not suggest a serious process, so I do not think abx is needed at this time, specially given her hx of C diff infection. Monitor for new symptoms. Continue adequate hydration, Tylenol  500 mg 3-4 times per day as needed. Atrovent  nasal spray and nasal irrigations to help with nasal congestion. Clearly instructed about warning signs.  Recommend repeating COVID 19 test tomorrow and if positive to call. Given her hx of CKD III and current medications, if positive test, Molnupiravid would be the best treatment options.  -     Ipratropium Bromide ; Place 2 sprays into both nostrils 4 (four) times daily.  Dispense: 15 mL; Refill: 0  Cough, unspecified type Symptomatic treatment with Benzonatate  recommended. Lung auscultation normal, so I do nit think CXR is warrant today.  -     POC COVID-19 BinaxNow -     POCT Influenza A/B -     Benzonatate ; Take 1 capsule (100 mg total) by mouth 2 (two) times daily as needed for up to 10 days.  Dispense: 20 capsule; Refill: 0  Return if symptoms worsen or fail to improve.  Hertha Gergen G. Leman Martinek, MD  [facility]. [place] office.

## 2024-08-11 ENCOUNTER — Telehealth: Payer: Self-pay | Admitting: Physical Medicine and Rehabilitation

## 2024-08-11 ENCOUNTER — Telehealth: Payer: Self-pay

## 2024-08-11 NOTE — Telephone Encounter (Signed)
 Patient called and said that she needs records faxed over to her neuro surgeon of the shots she received. CB#[phone]  she ask for megan to call them for her.

## 2024-08-11 NOTE — Telephone Encounter (Signed)
 Patient is asking for a referral for PT to be sent to [facility]. Call [phone] with questions

## 2024-08-12 NOTE — Telephone Encounter (Signed)
 IC, spoke with patient. I received patient's verbal authorization to release medical records to Dr. Alm Molt. Dr. Eldonna records faxed [phone]

## 2024-08-17 ENCOUNTER — Ambulatory Visit: Admitting: Obstetrics and Gynecology

## 2024-08-17 NOTE — Progress Notes (Deleted)
 GYNECOLOGY  VISIT   HPI: 89 y.o.   Married  Caucasian female   G2P2000 with No LMP recorded. Patient has had a hysterectomy.   here for: 4 week follow up      GYNECOLOGIC HISTORY: No LMP recorded. Patient has had a hysterectomy. Contraception:  PMP Menopausal hormone therapy:  n/a Last 2 paps:  Many years ago History of abnormal Pap or positive HPV:  no Mammogram:  06/03/24 Breast Density Cat B, BIRADS Cat 1 neg        OB History     Gravida  2   Para  2   Term  2   Preterm  0   AB  0   Living         SAB  0   IAB  0   Ectopic  0   Multiple      Live Births                 Patient Active Problem List   Diagnosis Date Noted   C. difficile colitis 06/01/2024   Clostridioides difficile infection 10/22/2023   Supraclavicular fossa fullness 09/24/2023   COVID-19 09/24/2023   Cough 06/28/2023   Degenerative tear of left medial meniscus 06/20/2023   History of skin cancer 05/20/2023   Melanocytic nevi of trunk 05/20/2023   Seborrheic keratoses 05/20/2023   Squamous cell carcinoma of skin of right lower extremity 05/20/2023   Mitral regurgitation 02/22/2023   Aneurysm of ascending aorta without rupture 02/18/2023   Cellulitis of left leg 02/18/2023   Closed comminuted fracture of left humerus 02/07/2023   Bradycardia 02/07/2023   Fall 02/07/2023   Chronic a-fib (HCC) 02/07/2023   Statin intolerance 12/31/2022   Acute pain of right knee 12/06/2022   Impetigo 10/01/2022   Insomnia 10/01/2022   Osteoporosis 06/11/2022   Weakness 04/25/2022   Weakness generalized 04/25/2022   Falls frequently 04/19/2022   Iron  deficiency anemia 12/08/2021   Skin ulcer, limited to breakdown of skin (HCC) 11/22/2021   Anemia, iron  deficiency 11/22/2021   Low serum vitamin B12 11/22/2021   Acute renal failure superimposed on stage 2 chronic kidney disease 11/20/2021   ABLA (acute blood loss anemia) 11/20/2021   Hypotension 11/19/2021   Shortness of breath 06/12/2021    Right leg pain 02/21/2021   History of Clostridioides difficile colitis    Abnormal CT of the abdomen    Aortic atherosclerosis 08/27/2020   Acute prerenal azotemia 08/27/2020   Shock circulatory (HCC) 08/27/2020   Metabolic acidosis with normal anion gap and bicarbonate losses 08/27/2020   Abdominal cramps 03/16/2020   Neurogenic orthostatic hypotension (HCC) 09/16/2019   Greater trochanteric pain syndrome of right lower extremity 07/30/2019   Abdominal pain 05/22/2018   Bradycardia with 41-50 beats per minute 12/27/2017   Right bundle branch block (RBBB) on electrocardiogram (ECG) 12/27/2017   Chronic kidney disease, stage 3b (HCC) 12/24/2017   Cellulitis of leg, right with large prepatella hematoma and open wounds 12/24/2017   Iliotibial band syndrome of right side 12/16/2017   Intervertebral lumbar disc disorder with myelopathy, lumbar region 04/25/2017   Family history of colon cancer in mother 01/22/2017   Primary osteoarthritis of both first carpometacarpal joints 01/02/2017   Pain 11/21/2016   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 09/13/2016   Chronic anticoagulation 05/24/2015   Coronary artery disease involving native coronary artery of native heart with angina pectoris 12/20/2014   Diarrhea 12/22/2013   Permanent atrial fibrillation (HCC) 11/21/2012  Greater trochanteric bursitis of both hips 05/21/2012   Well adult exam 11/07/2011   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   Bruising 12/18/2010   Osteoarthritis of left hip 07/03/2010   DEGENERATIVE DISC DISEASE, LUMBOSACRAL SPINE 05/18/2010   SYNCOPE 10/27/2008   Anxiety 05/21/2008   IBS 05/21/2008   Essential hypertension 06/16/2007   GERD (gastroesophageal reflux disease) 06/16/2007    Past Medical History:  Diagnosis Date   Acute blood loss anemia 12/25/2017   Allergic rhinitis    Anxiety    Arthritis    my whole spine (07/01/2017)   Arthritis    Atrial fibrillation (HCC)    Bowel  obstruction (HCC)    in [place]    Bradycardia with 41-50 beats per minute 12/27/2017   Cancer (HCC)    Cellulitis of leg, right with large prepatella hematoma and open wounds 12/26/2017   CKD (chronic kidney disease) stage 2, GFR 60-89 ml/min    Colon polyps    Coronary artery disease    10/18 PCI/DES to p/m LCx with cutting balloon to mLcx   Diverticulosis of colon    GERD (gastroesophageal reflux disease)    Hip bursitis 2010   Dr Jacklin, Post op seroma   History of colon polyps    HSV (herpes simplex virus) anogenital infection 07/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   HTN (hypertension)    IBS (irritable bowel syndrome)    constipation predominant - Dr Luis   Lichen sclerosus    Osteopenia 11/2016   T score -2.0 FRAX 15%/4.3%   PAC (premature atrial contraction)    Symptomatiic   Renal insufficiency    Right bundle branch block (RBBB) on electrocardiogram (ECG) 12/27/2017   Scoliosis    SVT (supraventricular tachycardia)    brief history   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   biopsy showing vulvar atypia, possible VIN I    Past Surgical History:  Procedure Laterality Date   ANTERIOR AND POSTERIOR VAGINAL REPAIR  01/2002   thelbert 01/23/2011   APPENDECTOMY  1946   CARDIAC CATHETERIZATION  06/26/2017   CORONARY ANGIOPLASTY WITH STENT PLACEMENT  07/01/2017   CORONARY ATHERECTOMY N/A 07/01/2017   Procedure: CORONARY ATHERECTOMY;  Surgeon: Claudene Victory ORN, MD;  Location: [facility] INVASIVE CV LAB;  Service: Cardiovascular;  Laterality: N/A;   CORONARY STENT INTERVENTION N/A 07/01/2017   Procedure: CORONARY STENT INTERVENTION;  Surgeon: Claudene Victory ORN, MD;  Location: [facility];  Service: Cardiovascular;  Laterality: N/A;   HAMMER TOE SURGERY     HEMORRHOID BANDING     HIP SURGERY Left 04/2009   hip examination under anesthesia followed by greater trochanteric bursectomy; iliotibial band tenotomy/notes 01/20/2011   I & D EXTREMITY Right 01/10/2018    Procedure: IRRIGATION AND DEBRIDEMENT RIGHT KNEE, APPLY WOUND VAC;  Surgeon: Harden Jerona GAILS, MD;  Location: [facility] OR;  Service: Orthopedics;  Laterality: Right;   KNEE BURSECTOMY Right 04/2009   thelbert 01/09/2011   LEFT ATRIAL APPENDAGE OCCLUSION N/A 08/24/2021   Procedure: LEFT ATRIAL APPENDAGE OCCLUSION;  Surgeon: Wonda Sharper, MD;  Location[facility] INVASIVE CV LAB;  Service: Cardiovascular;  Laterality: N/A;   LEFT HEART CATH AND CORONARY ANGIOGRAPHY N/A 06/26/2017   Procedure: LEFT HEART CATH AND CORONARY ANGIOGRAPHY;  Surgeon: Claudene Victory ORN, MD;  Location: [facility] INV[facility];  Service: Cardiovascular;  Laterality: N/A;   PUBOVAGINAL SLING  01/2002   thelbert 01/23/2011   REDUCTION MAMMAPLASTY     TEE WITHOUT CARDIOVERSION N/A 08/24/2021  Procedure: TRANSESOPHAGEAL ECHOCARDIOGRAM (TEE);  Surgeon: Wonda Sharper, MD;  Location: [facility] INVASIVE CV LAB;  Service: Cardiovascular;  Laterality: N/A;   TEMPORARY PACEMAKER N/A 07/01/2017   Procedure: TEMPORARY PACEMAKER;  Surgeon: Claudene Victory ORN, MD;  Location: [facility] INVASIVE CV LAB;  Service: Cardiovascular;  Laterality: N/A;   VAGINAL HYSTERECTOMY  01/2002   Vaginal hysterectomy, bilateral salpingo-oophorectomy/notes 01/23/2011    Current Outpatient Medications  Medication Sig Dispense Refill   acetaminophen  (TYLENOL ) 500 MG tablet Take 1,000 mg by mouth every 6 (six) hours as needed (body aches / sore hip). Maximum of 6 tablets daily (3000mg )     apixaban  (ELIQUIS ) 2.5 MG TABS tablet TAKE 1 TABLET BY MOUTH TWICE  DAILY 200 tablet 1   [employer] Sunscreen SPF 30 LOTN apply topically to sun exposed areas Daily; Duration: 30     diclofenac  Sodium (VOLTAREN ) 1 % GEL Apply 2 g topically 4 (four) times daily as needed (back pain.).     dicyclomine  (BENTYL ) 20 MG tablet Take 1 by mouth every 4-6 hours as needed for cramping 30 tablet 1   escitalopram  (LEXAPRO ) 10 MG tablet Take 1 tablet (10 mg total) by mouth daily. 30 tablet 11   famotidine  (PEPCID ) 20 MG tablet Take  1 tablet (20 mg total) by mouth 2 (two) times daily. 180 tablet 3   Homeopathic Products (ARNICARE) GEL Apply 1 application  topically daily as needed (skin bruising).     hyoscyamine  (LEVSIN ) 0.125 MG tablet TAKE 1-2 TABLETS EVERY 4 HOURS AS NEEDED FOR UP TO 10 DAYS FOR CRAMPING. 100 tablet 3   ipratropium (ATROVENT ) 0.06 % nasal spray Place 2 sprays into both nostrils 4 (four) times daily. 15 mL 0   LORazepam  (ATIVAN ) 1 MG tablet TAKE ONE TABLET BY MOUTH TWICE DAILY AS NEEDED FOR ANXIETY / SLEEP. 180 tablet 1   losartan  (COZAAR ) 25 MG tablet Take 1 tablet (25 mg total) by mouth daily. 90 tablet 3   nitroGLYCERIN  (NITROSTAT ) 0.4 MG SL tablet Place 1 tablet (0.4 mg total) under the tongue every 5 (five) minutes as needed for chest pain (Call 911 at 3rd dose within 15 minutes.). 20 tablet 3   NON FORMULARY Vitamin c, vitamin d      ondansetron  (ZOFRAN ) 4 MG tablet Take 1 tablet (4 mg total) by mouth every 8 (eight) hours as needed for nausea or vomiting. 21 tablet 1   ondansetron  (ZOFRAN -ODT) 8 MG disintegrating tablet dissolve 1 tablet (8 mg total) in mouth every 8 (eight) hours as needed for nausea or vomiting. 12 tablet 3   Polyethyl Glycol-Propyl Glycol (LUBRICANT EYE DROPS) 0.4-0.3 % SOLN Place 1-2 drops into both eyes at bedtime.     polyethylene glycol (MIRALAX  / GLYCOLAX ) 17 g packet Take 17 g by mouth as needed for mild constipation or moderate constipation.     saccharomyces boulardii (FLORASTOR) 250 MG capsule Take 1 capsule (250 mg total) by mouth 2 (two) times daily. 60 capsule 3   sodium chloride  (OCEAN) 0.65 % SOLN nasal spray Place 1 spray into both nostrils at bedtime as needed for congestion.     valACYclovir  (VALTREX ) 500 MG tablet Take 1 tablet (500 mg total) by mouth daily. Take one tablet by mouth daily for 3 days for an outbreak 30 tablet 0   vancomycin  (VANCOCIN ) 125 MG capsule Take 1 capsule by mouth 4 times daily for 12 days, THEN 1 capsule twice daily for 7 days, THEN 1  capsule daily for 7 days, THEN 1 capsule every other  day for 28 days, THEN 1 capsule  every 3 days for 28 days. 92 capsule 0   No current facility-administered medications for this visit.     ALLERGIES: Macrobid  [nitrofurantoin  monohyd macro], Meloxicam , Digoxin  and related, Diprolene  [betamethasone  dipropionate aug], Ferrous sulfate, Keflex  [cephalexin ], Lipitor  [atorvastatin ], Mobic  [meloxicam ], and Sulfamethoxazole -trimethoprim   Family History  Problem Relation Age of Onset   Colon cancer Mother        Dx age 61, died at age 30   Diabetes Father    Prostate cancer Father    Prostate cancer Brother    Pancreatic cancer Brother    Stomach cancer Son     Social History   Socioeconomic History   Marital status: Married    Spouse name: [name]   Number of children: 2   Years of education: Not on file   Highest education level: Bachelor's degree (e.g., BA, AB, BS)  Occupational History   Occupation: Engineer, [employer]: RETIRED  Tobacco Use   Smoking status: Former    Types: Cigarettes   Smokeless tobacco: Never  Vaping Use   Vaping status: Never Used  Substance and Sexual Activity   Alcohol use: Yes    Comment: moderatley [place] before dinner   Drug use: Never   Sexual activity: Not Currently    Birth control/protection: Surgical    Comment: hysterectomy; less than 5, IC after 18, no STD, no abnormal paps  Other Topics Concern   Not on file  Social History Narrative   ** Merged History Encounter ** Regular Exercise -  YES      Lives at home with husband/2025   Social Drivers of Health   Financial Resource Strain: Low Risk  (11/22/2023)   Overall Financial Resource Strain (CARDIA)    Difficulty of Paying Living Expenses: Not hard at all  Food Insecurity: No Food Insecurity (07/22/2024)   Hunger Vital Sign    Worried About Running Out of Food in the Last Year: Never true    Ran Out of Food in the Last Year: Never true  Transportation Needs: No Transportation  Needs (07/22/2024)   PRAPARE - Administrator, Civil Service (Medical): No    Lack of Transportation (Non-Medical): No  Physical Activity: Insufficiently Active (11/22/2023)   Exercise Vital Sign    Days of Exercise per Week: 2 days    Minutes of Exercise per Session: 60 min  Stress: No Stress Concern Present (11/22/2023)   [employer] of Occupational Health - Occupational Stress Questionnaire    Feeling of Stress : Only a little  Social Connections: Moderately Isolated (07/19/2024)   Social Connection and Isolation Panel    Frequency of Communication with Friends and Family: Three times a week    Frequency of Social Gatherings with Friends and Family: Three times a week    Attends Religious Services: Never    Active Member of Clubs or Organizations: No    Attends Banker Meetings: Never    Marital Status: Married  Catering Manager Violence: Not At Risk (07/22/2024)   Humiliation, Afraid, Rape, and Kick questionnaire    Fear of Current or Ex-Partner: No    Emotionally Abused: No    Physically Abused: No    Sexually Abused: No    Review of Systems  PHYSICAL EXAMINATION:   There were no vitals taken for this visit.    General appearance: alert, cooperative and appears stated age Head: Normocephalic, without obvious abnormality, atraumatic Neck: no adenopathy, supple,  symmetrical, trachea midline and thyroid  normal to inspection and palpation Lungs: clear to auscultation bilaterally Breasts: normal appearance, no masses or tenderness, No nipple retraction or dimpling, No nipple discharge or bleeding, No axillary or supraclavicular adenopathy Heart: regular rate and rhythm Abdomen: soft, non-tender, no masses,  no organomegaly Extremities: extremities normal, atraumatic, no cyanosis or edema Skin: Skin color, texture, turgor normal. No rashes or lesions Lymph nodes: Cervical, supraclavicular, and axillary nodes normal. No abnormal inguinal nodes  palpated Neurologic: Grossly normal  Pelvic: External genitalia:  no lesions              Urethra:  normal appearing urethra with no masses, tenderness or lesions              Bartholins and Skenes: normal                 Vagina: normal appearing vagina with normal color and discharge, no lesions              Cervix: no lesions                Bimanual Exam:  Uterus:  normal size, contour, position, consistency, mobility, non-tender              Adnexa: no mass, fullness, tenderness              Rectal exam: {yes no:314532}.  Confirms.              Anus:  normal sphincter tone, no lesions  Chaperone was present for exam:  {BSCHAPERONE:31226::Emily F, CMA}  ASSESSMENT:    PLAN:    {LABS (Optional):23779}  ***  total time was spent for this patient encounter, including preparation, face-to-face counseling with the patient, coordination of care, and documentation of the encounter.

## 2024-08-20 NOTE — Progress Notes (Signed)
 GYNECOLOGY  VISIT   HPI: 89 y.o.   Married  Caucasian female   G2P2000 with No LMP recorded. Patient has had a hysterectomy.   here for: 4 week follow up. Still having vaginal discomfort, itching, and burning. Just finished an antibiotic.   Notes internal and external irritation.   Occasional vaginal discharge.   Clobetasol  has not helped.   Sometimes has an odor to the urine. Some pain when the urine touches the vulvar skin.   Just completed treatment for C diff.  Took Vancomycin , and just completed this.  Is feeling better.   GYNECOLOGIC HISTORY: No LMP recorded. Patient has had a hysterectomy. Contraception:  PMP Menopausal hormone therapy:  n/a Last 2 paps:  Many years ago History of abnormal Pap or positive HPV:  no Mammogram:  06/03/24 Breast Density Cat B, BIRADS Cat 1 neg        OB History     Gravida  2   Para  2   Term  2   Preterm  0   AB  0   Living         SAB  0   IAB  0   Ectopic  0   Multiple      Live Births                 Patient Active Problem List   Diagnosis Date Noted   C. difficile colitis 06/01/2024   Clostridioides difficile infection 10/22/2023   Supraclavicular fossa fullness 09/24/2023   COVID-19 09/24/2023   Cough 06/28/2023   Degenerative tear of left medial meniscus 06/20/2023   History of skin cancer 05/20/2023   Melanocytic nevi of trunk 05/20/2023   Seborrheic keratoses 05/20/2023   Squamous cell carcinoma of skin of right lower extremity 05/20/2023   Mitral regurgitation 02/22/2023   Aneurysm of ascending aorta without rupture 02/18/2023   Cellulitis of left leg 02/18/2023   Closed comminuted fracture of left humerus 02/07/2023   Bradycardia 02/07/2023   Fall 02/07/2023   Chronic a-fib (HCC) 02/07/2023   Statin intolerance 12/31/2022   Acute pain of right knee 12/06/2022   Impetigo 10/01/2022   Insomnia 10/01/2022   Osteoporosis 06/11/2022   Weakness 04/25/2022   Weakness generalized 04/25/2022    Falls frequently 04/19/2022   Iron  deficiency anemia 12/08/2021   Skin ulcer, limited to breakdown of skin (HCC) 11/22/2021   Anemia, iron  deficiency 11/22/2021   Low serum vitamin B12 11/22/2021   Acute renal failure superimposed on stage 2 chronic kidney disease 11/20/2021   ABLA (acute blood loss anemia) 11/20/2021   Hypotension 11/19/2021   Shortness of breath 06/12/2021   Right leg pain 02/21/2021   History of Clostridioides difficile colitis    Abnormal CT of the abdomen    Aortic atherosclerosis 08/27/2020   Acute prerenal azotemia 08/27/2020   Shock circulatory (HCC) 08/27/2020   Metabolic acidosis with normal anion gap and bicarbonate losses 08/27/2020   Abdominal cramps 03/16/2020   Neurogenic orthostatic hypotension (HCC) 09/16/2019   Greater trochanteric pain syndrome of right lower extremity 07/30/2019   Abdominal pain 05/22/2018   Bradycardia with 41-50 beats per minute 12/27/2017   Right bundle branch block (RBBB) on electrocardiogram (ECG) 12/27/2017   Chronic kidney disease, stage 3b (HCC) 12/24/2017   Cellulitis of leg, right with large prepatella hematoma and open wounds 12/24/2017   Iliotibial band syndrome of right side 12/16/2017   Intervertebral lumbar disc disorder with myelopathy, lumbar region 04/25/2017   Family history  of colon cancer in mother 01/22/2017   Primary osteoarthritis of both first carpometacarpal joints 01/02/2017   Pain 11/21/2016   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 09/13/2016   Chronic anticoagulation 05/24/2015   Coronary artery disease involving native coronary artery of native heart with angina pectoris 12/20/2014   Diarrhea 12/22/2013   Permanent atrial fibrillation (HCC) 11/21/2012   Greater trochanteric bursitis of both hips 05/21/2012   Well adult exam 11/07/2011   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   Bruising 12/18/2010   Osteoarthritis of left hip 07/03/2010   DEGENERATIVE DISC DISEASE, LUMBOSACRAL SPINE  05/18/2010   SYNCOPE 10/27/2008   Anxiety 05/21/2008   IBS 05/21/2008   Essential hypertension 06/16/2007   GERD (gastroesophageal reflux disease) 06/16/2007    Past Medical History:  Diagnosis Date   Acute blood loss anemia 12/25/2017   Allergic rhinitis    Anxiety    Arthritis    my whole spine (07/01/2017)   Arthritis    Atrial fibrillation (HCC)    Bowel obstruction (HCC)    in [place]    Bradycardia with 41-50 beats per minute 12/27/2017   Cancer (HCC)    Cellulitis of leg, right with large prepatella hematoma and open wounds 12/26/2017   CKD (chronic kidney disease) stage 2, GFR 60-89 ml/min    Colon polyps    Coronary artery disease    10/18 PCI/DES to p/m LCx with cutting balloon to mLcx   Diverticulosis of colon    GERD (gastroesophageal reflux disease)    Hip bursitis 2010   Dr Jacklin, Post op seroma   History of colon polyps    HSV (herpes simplex virus) anogenital infection 07/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   HTN (hypertension)    IBS (irritable bowel syndrome)    constipation predominant - Dr Luis   Lichen sclerosus    Osteopenia 11/2016   T score -2.0 FRAX 15%/4.3%   PAC (premature atrial contraction)    Symptomatiic   Renal insufficiency    Right bundle branch block (RBBB) on electrocardiogram (ECG) 12/27/2017   Scoliosis    SVT (supraventricular tachycardia)    brief history   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   biopsy showing vulvar atypia, possible VIN I    Past Surgical History:  Procedure Laterality Date   ANTERIOR AND POSTERIOR VAGINAL REPAIR  01/2002   thelbert 01/23/2011   APPENDECTOMY  1946   CARDIAC CATHETERIZATION  06/26/2017   CORONARY ANGIOPLASTY WITH STENT PLACEMENT  07/01/2017   CORONARY ATHERECTOMY N/A 07/01/2017   Procedure: CORONARY ATHERECTOMY;  Surgeon: Claudene Victory ORN, MD;  Location: [facility]ASIVE [facility];  Service: Cardiovascular;  Laterality: N/A;   CORONARY STENT INTERVENTION N/A 07/01/2017    Procedure: CORONARY STENT INTERVENTION;  Surgeon: Claudene Victory ORN, MD;  Location: [facility];  Service: Cardiovascular;  Laterality: N/A;   HAMMER TOE SURGERY     HEMORRHOID BANDING     HIP SURGERY Left 04/2009   hip examination under anesthesia followed by greater trochanteric bursectomy; iliotibial band tenotomy/notes 01/20/2011   I & D EXTREMITY Right 01/10/2018   Procedure: IRRIGATION AND DEBRIDEMENT RIGHT KNEE, APPLY WOUND VAC;  Surgeon: Harden Jerona GAILS, MD;  Location: [facility] OR;  Service: Orthopedics;  Laterality: Right;   KNEE BURSECTOMY Right 04/2009   thelbert 01/09/2011   LEFT ATRIAL APPENDAGE OCCLUSION N/A 08/24/2021   Procedure: LEFT ATRIAL APPENDAGE OCCLUSION;  Surgeon: Wonda Sharper, MD;  Location: [facility] INVASIVE CV LAB;  Service: Cardiovascular;  Laterality: N/A;   LEFT HEART CATH AND CORONARY ANGIOGRAPHY N/A 06/26/2017   Procedure: LEFT HEART CATH AND CORONARY ANGIOGRAPHY;  Surgeon: Claudene Victory ORN, MD;  Location: [facility];  Service: Cardiovascular;  Laterality: N/A;   PUBOVAGINAL SLING  01/2002   thelbert 01/23/2011   REDUCTION MAMMAPLASTY     TEE WITHOUT CARDIOVERSION N/A 08/24/2021   Procedure: TRANSESOPHAGEAL ECHOCARDIOGRAM (TEE);  Surgeon: Wonda Sharper, MD;  Location: [facility] INVASIVE CV LAB;  Service: Cardiovascular;  Laterality: N/A;   TEMPORARY PACEMAKER N/A 07/01/2017   Procedure: TEMPORARY PACEMAKER;  Surgeon: Claudene Victory ORN, MD;  Location: [facility] INVASIVE CV LAB;  Service: Cardiovascular;  Laterality: N/A;   VAGINAL HYSTERECTOMY  01/2002   Vaginal hysterectomy, bilateral salpingo-oophorectomy/notes 01/23/2011    Current Outpatient Medications  Medication Sig Dispense Refill   acetaminophen  (TYLENOL ) 500 MG tablet Take 1,000 mg by mouth every 6 (six) hours as needed (body aches / sore hip). Maximum of 6 tablets daily (3000mg )     apixaban  (ELIQUIS ) 2.5 MG TABS tablet TAKE 1 TABLET BY MOUTH TWICE  DAILY 200 tablet 1   [employer] Sunscreen SPF 30 LOTN apply topically to sun  exposed areas Daily; Duration: 30     diclofenac  Sodium (VOLTAREN ) 1 % GEL Apply 2 g topically 4 (four) times daily as needed (back pain.).     dicyclomine  (BENTYL ) 20 MG tablet Take 1 by mouth every 4-6 hours as needed for cramping 30 tablet 1   escitalopram  (LEXAPRO ) 10 MG tablet Take 1 tablet (10 mg total) by mouth daily. 30 tablet 11   famotidine  (PEPCID ) 20 MG tablet Take 1 tablet (20 mg total) by mouth 2 (two) times daily. 180 tablet 3   Homeopathic Products (ARNICARE) GEL Apply 1 application  topically daily as needed (skin bruising).     hyoscyamine  (LEVSIN ) 0.125 MG tablet TAKE 1-2 TABLETS EVERY 4 HOURS AS NEEDED FOR UP TO 10 DAYS FOR CRAMPING. 100 tablet 3   ipratropium (ATROVENT ) 0.06 % nasal spray Place 2 sprays into both nostrils 4 (four) times daily. 15 mL 0   LORazepam  (ATIVAN ) 1 MG tablet TAKE ONE TABLET BY MOUTH TWICE DAILY AS NEEDED FOR ANXIETY / SLEEP. 180 tablet 1   losartan  (COZAAR ) 25 MG tablet Take 1 tablet (25 mg total) by mouth daily. 90 tablet 3   nitroGLYCERIN  (NITROSTAT ) 0.4 MG SL tablet Place 1 tablet (0.4 mg total) under the tongue every 5 (five) minutes as needed for chest pain (Call 911 at 3rd dose within 15 minutes.). 20 tablet 3   NON FORMULARY Vitamin c, vitamin d      ondansetron  (ZOFRAN ) 4 MG tablet Take 1 tablet (4 mg total) by mouth every 8 (eight) hours as needed for nausea or vomiting. 21 tablet 1   ondansetron  (ZOFRAN -ODT) 8 MG disintegrating tablet dissolve 1 tablet (8 mg total) in mouth every 8 (eight) hours as needed for nausea or vomiting. 12 tablet 3   Polyethyl Glycol-Propyl Glycol (LUBRICANT EYE DROPS) 0.4-0.3 % SOLN Place 1-2 drops into both eyes at bedtime.     polyethylene glycol (MIRALAX  / GLYCOLAX ) 17 g packet Take 17 g by mouth as needed for mild constipation or moderate constipation.     saccharomyces boulardii (FLORASTOR) 250 MG capsule Take 1 capsule (250 mg total) by mouth 2 (two) times daily. 60 capsule 3   sodium chloride  (OCEAN) 0.65 %  SOLN nasal spray Place 1 spray into both nostrils at bedtime as needed for congestion.     valACYclovir  (VALTREX ) 500  MG tablet Take 1 tablet (500 mg total) by mouth daily. Take one tablet by mouth daily for 3 days for an outbreak 30 tablet 0   vancomycin  (VANCOCIN ) 125 MG capsule Take 1 capsule by mouth 4 times daily for 12 days, THEN 1 capsule twice daily for 7 days, THEN 1 capsule daily for 7 days, THEN 1 capsule every other day for 28 days, THEN 1 capsule  every 3 days for 28 days. (Patient not taking: No sig reported) 92 capsule 0   No current facility-administered medications for this visit.     ALLERGIES: Macrobid  [nitrofurantoin  monohyd macro], Meloxicam , Digoxin  and related, Diprolene  [betamethasone  dipropionate aug], Ferrous sulfate, Keflex  [cephalexin ], Lipitor  [atorvastatin ], Mobic  [meloxicam ], and Sulfamethoxazole -trimethoprim   Family History  Problem Relation Age of Onset   Colon cancer Mother        Dx age 66, died at age 69   Diabetes Father    Prostate cancer Father    Prostate cancer Brother    Pancreatic cancer Brother    Stomach cancer Son     Social History   Socioeconomic History   Marital status: Married    Spouse name: [name]   Number of children: 2   Years of education: Not on file   Highest education level: Bachelor's degree (e.g., BA, AB, BS)  Occupational History   Occupation: Engineer, [employer]: RETIRED  Tobacco Use   Smoking status: Former    Types: Cigarettes   Smokeless tobacco: Never  Vaping Use   Vaping status: Never Used  Substance and Sexual Activity   Alcohol use: Yes    Comment: moderatley [place] before dinner   Drug use: Never   Sexual activity: Not Currently    Birth control/protection: Surgical    Comment: hysterectomy; less than 5, IC after 18, no STD, no abnormal paps  Other Topics Concern   Not on file  Social History Narrative   ** Merged History Encounter ** Regular Exercise -  YES      Lives at home with husband/2025    Social Drivers of Health   Tobacco Use: Medium Risk (08/24/2024)   Patient History    Smoking Tobacco Use: Former    Smokeless Tobacco Use: Never    Passive Exposure: Not on Actuary Strain: Low Risk (11/22/2023)   Overall Financial Resource Strain (CARDIA)    Difficulty of Paying Living Expenses: Not hard at all  Food Insecurity: No Food Insecurity (07/22/2024)   Epic    Worried About Programme Researcher, Broadcasting/film/video in the Last Year: Never true    Ran Out of Food in the Last Year: Never true  Transportation Needs: No Transportation Needs (07/22/2024)   Epic    Lack of Transportation (Medical): No    Lack of Transportation (Non-Medical): No  Physical Activity: Insufficiently Active (11/22/2023)   Exercise Vital Sign    Days of Exercise per Week: 2 days    Minutes of Exercise per Session: 60 min  Stress: No Stress Concern Present (11/22/2023)   [employer] of Occupational Health - Occupational Stress Questionnaire    Feeling of Stress : Only a little  Social Connections: Moderately Isolated (07/19/2024)   Social Connection and Isolation Panel    Frequency of Communication with Friends and Family: Three times a week    Frequency of Social Gatherings with Friends and Family: Three times a week    Attends Religious Services: Never    Active Member of Clubs or Organizations: No  Attends Banker Meetings: Never    Marital Status: Married  Catering Manager Violence: Not At Risk (07/22/2024)   Epic    Fear of Current or Ex-Partner: No    Emotionally Abused: No    Physically Abused: No    Sexually Abused: No  Depression (PHQ2-9): Low Risk (07/22/2024)   Depression (PHQ2-9)    PHQ-2 Score: 0  Alcohol Screen: Low Risk (11/22/2023)   Alcohol Screen    Last Alcohol Screening Score (AUDIT): 0  Housing: Unknown (07/22/2024)   Epic    Unable to Pay for Housing in the Last Year: No    Number of Times Moved in the Last Year: Not on file    Homeless in the Last  Year: No  Utilities: Not At Risk (07/22/2024)   Epic    Threatened with loss of utilities: No  Health Literacy: Adequate Health Literacy (11/22/2023)   B1300 Health Literacy    Frequency of need for help with medical instructions: Never    Review of Systems  All other systems reviewed and are negative.   PHYSICAL EXAMINATION:   BP 114/72 (BP Location: Left Arm, Patient Position: Sitting)   Pulse 72   SpO2 99%     General appearance: alert, cooperative and appears stated age   Pelvic: External genitalia:  group of shallow ulcers of the left labia majora.                Urethra:  normal appearing urethra with no masses, tenderness or lesions              Bartholins and Skenes: normal                 Vagina: normal appearing vagina with normal color and discharge, no lesions              Cervix: absent                Bimanual Exam:  Uterus:  absent              Adnexa: no mass, fullness, tenderness         Chaperone was present for exam:  Kari HERO, CMA  ASSESSMENT:  Vaginal discomfort.  Vulvitis.  Looks like an HSV outbreak.  Recent treatment for C diff enterocolitis.  PLAN:  Wet prep:  negative for yeast, clue cells, and trichomonas.  Urinalysis:  sg 1.020, ph 5.5, 1+ protein nitrites positive, WBC 20 - 40 WBC, 3 - 10 RBC, 6 - 10 epis, many bacteria. UC sent.  No Rx for an antibiotic until UC is finalized.   Valtrex  500 mg po daily for 3 days.   She has Rx.   25 min  total time was spent for this patient encounter, including preparation, face-to-face counseling with the patient, coordination of care, and documentation of the encounter.

## 2024-08-24 ENCOUNTER — Encounter: Payer: Self-pay | Admitting: Obstetrics and Gynecology

## 2024-08-24 ENCOUNTER — Ambulatory Visit: Admitting: Obstetrics and Gynecology

## 2024-08-24 VITALS — BP 114/72 | HR 72

## 2024-08-24 DIAGNOSIS — B009 Herpesviral infection, unspecified: Secondary | ICD-10-CM | POA: Diagnosis not present

## 2024-08-24 DIAGNOSIS — N949 Unspecified condition associated with female genital organs and menstrual cycle: Secondary | ICD-10-CM

## 2024-08-24 LAB — WET PREP FOR TRICH, YEAST, CLUE

## 2024-08-24 NOTE — Patient Instructions (Signed)
 Hailey [name],   It looks like you have an outbreak of herpes today.  Take Valtrex  500 mg daily for 3 days to treat this.   We are sending your urine to check for bladder infection, and I will contact you with results.

## 2024-08-25 ENCOUNTER — Ambulatory Visit: Payer: Self-pay | Admitting: Obstetrics and Gynecology

## 2024-08-25 LAB — URINALYSIS, COMPLETE W/RFL CULTURE
Bilirubin Urine: NEGATIVE
Glucose, UA: NEGATIVE
Hyaline Cast: NONE SEEN /LPF
Ketones, ur: NEGATIVE
Nitrites, Initial: POSITIVE — AB
Specific Gravity, Urine: 1.02 (ref 1.001–1.035)
pH: 5.5 (ref 5.0–8.0)

## 2024-08-25 LAB — URINE CULTURE
MICRO NUMBER:: 17355884
SPECIMEN QUALITY:: ADEQUATE

## 2024-08-25 LAB — CULTURE INDICATED

## 2024-08-26 ENCOUNTER — Ambulatory Visit: Admitting: Internal Medicine

## 2024-09-01 ENCOUNTER — Other Ambulatory Visit: Payer: Self-pay

## 2024-09-01 ENCOUNTER — Encounter: Payer: Self-pay | Admitting: Gastroenterology

## 2024-09-01 ENCOUNTER — Telehealth: Payer: Self-pay

## 2024-09-01 ENCOUNTER — Ambulatory Visit: Admitting: Gastroenterology

## 2024-09-01 VITALS — BP 124/68 | HR 81 | Ht 63.0 in | Wt 143.5 lb

## 2024-09-01 DIAGNOSIS — K5904 Chronic idiopathic constipation: Secondary | ICD-10-CM

## 2024-09-01 DIAGNOSIS — A0471 Enterocolitis due to Clostridium difficile, recurrent: Secondary | ICD-10-CM | POA: Diagnosis not present

## 2024-09-01 DIAGNOSIS — K5900 Constipation, unspecified: Secondary | ICD-10-CM | POA: Diagnosis not present

## 2024-09-01 MED ORDER — VANCOMYCIN HCL 125 MG PO CAPS: ORAL_CAPSULE | ORAL | 0 refills | Status: DC

## 2024-09-01 MED ORDER — VOWST PO CAPS: 4.0000 | ORAL_CAPSULE | Freq: Every day | ORAL | 0 refills | Status: AC

## 2024-09-01 MED ORDER — VANCOMYCIN HCL 125 MG PO CAPS: ORAL_CAPSULE | ORAL | 0 refills | Status: AC

## 2024-09-01 MED ORDER — VOWST PO CAPS: 4.0000 | ORAL_CAPSULE | Freq: Every day | ORAL | 0 refills | Status: DC

## 2024-09-01 NOTE — Progress Notes (Signed)
 "                Hailey [name]    [id number]    04-04-1935  Primary Care Physician:Plotnikov, [name], MD  Referring Physician: Garald [name], MD [address] [place],  [place] [address]   Chief complaint: Recurrent C. difficile  Discussed the use of AI scribe software for clinical note transcription with the patient, who gave verbal consent to proceed.  History of Present Illness Hailey [name] is an 89 year old female with recurrent Clostridioides difficile infection who presents for follow-up of C. difficile and bowel symptoms.  Recurrent clostridioides difficile infection - Three episodes in the past year, most recent episode last month [September 2025, May 26, 2024 most recent 07/19/2024] - Completed a tapering course of antibiotics, but recently ran out and is currently off antibiotics for past 7 days   Altered bowel habits - Increased stool frequency over the past week to two to three movements per day, above baseline - Urgency to defecate after breakfast - Stools have become harder over the last two days - No watery or loose stools - No current diarrhea - Incomplete emptying - Occasionally wakes up at night to defecate.  Abdominal pain and associated gastrointestinal symptoms - Mild abdominal pain present today - Nausea over the past couple of nights, associated with constipation - Occasional loss of appetite during periods of constipation  Bowel regulation interventions - Previously used Colace and MiraLAX  for bowel regulation - Prefers Colace due to MiraLAX  being too strong - Uses fiber occasionally, but avoids during diarrhea    04/21/2024 CTAP W no acute intra-abdominal pathology, borderline cardiomegaly, moderate right and mild left renal cortical atrophy, severe distal colonic diverticulosis without diverticulitis 05/16/2023 CTAP W diverticulosis without diverticulitis  Family history of colon cancer 01/17/2012 colonoscopy with Dr. Luis  diverticulosis left colon extensive, large internal hemorrhoids   Outpatient Encounter Medications as of 09/01/2024  Medication Sig   acetaminophen  (TYLENOL ) 500 MG tablet Take 1,000 mg by mouth every 6 (six) hours as needed (body aches / sore hip). Maximum of 6 tablets daily (3000mg )   apixaban  (ELIQUIS ) 2.5 MG TABS tablet TAKE 1 TABLET BY MOUTH TWICE  DAILY   [employer] Sunscreen SPF 30 LOTN apply topically to sun exposed areas Daily; Duration: 30   diclofenac  Sodium (VOLTAREN ) 1 % GEL Apply 2 g topically 4 (four) times daily as needed (back pain.).   dicyclomine  (BENTYL ) 20 MG tablet Take 1 by mouth every 4-6 hours as needed for cramping   escitalopram  (LEXAPRO ) 10 MG tablet Take 1 tablet (10 mg total) by mouth daily.   famotidine  (PEPCID ) 20 MG tablet Take 1 tablet (20 mg total) by mouth 2 (two) times daily.   Homeopathic Products (ARNICARE) GEL Apply 1 application  topically daily as needed (skin bruising).   hyoscyamine  (LEVSIN ) 0.125 MG tablet TAKE 1-2 TABLETS EVERY 4 HOURS AS NEEDED FOR UP TO 10 DAYS FOR CRAMPING.   ipratropium (ATROVENT ) 0.06 % nasal spray Place 2 sprays into both nostrils 4 (four) times daily.   ketotifen  (ZADITOR ) 0.035 % ophthalmic solution    LORazepam  (ATIVAN ) 1 MG tablet TAKE ONE TABLET BY MOUTH TWICE DAILY AS NEEDED FOR ANXIETY / SLEEP.   losartan  (COZAAR ) 25 MG tablet Take 1 tablet (25 mg total) by mouth daily.   nitroGLYCERIN  (NITROSTAT ) 0.4 MG SL tablet Place 1 tablet (0.4 mg total) under the tongue every 5 (five) minutes as needed for chest pain (Call 911 at 3rd  dose within 15 minutes.).   NON FORMULARY Vitamin c, vitamin d    ondansetron  (ZOFRAN ) 4 MG tablet Take 1 tablet (4 mg total) by mouth every 8 (eight) hours as needed for nausea or vomiting.   ondansetron  (ZOFRAN -ODT) 8 MG disintegrating tablet dissolve 1 tablet (8 mg total) in mouth every 8 (eight) hours as needed for nausea or vomiting.   Polyethyl Glycol-Propyl Glycol (LUBRICANT EYE DROPS) 0.4-0.3 % SOLN  Place 1-2 drops into both eyes at bedtime.   polyethylene glycol (MIRALAX  / GLYCOLAX ) 17 g packet Take 17 g by mouth as needed for mild constipation or moderate constipation.   saccharomyces boulardii (FLORASTOR) 250 MG capsule Take 1 capsule (250 mg total) by mouth 2 (two) times daily.   sodium chloride  (OCEAN) 0.65 % SOLN nasal spray Place 1 spray into both nostrils at bedtime as needed for congestion.   valACYclovir  (VALTREX ) 500 MG tablet Take 1 tablet (500 mg total) by mouth daily. Take one tablet by mouth daily for 3 days for an outbreak   vancomycin  (VANCOCIN ) 125 MG capsule Take 1 capsule by mouth 4 times daily for 12 days, THEN 1 capsule twice daily for 7 days, THEN 1 capsule daily for 7 days, THEN 1 capsule every other day for 28 days, THEN 1 capsule  every 3 days for 28 days. (Patient not taking: No sig reported)   No facility-administered encounter medications on file as of 09/01/2024.    Allergies as of 09/01/2024 - Review Complete 08/24/2024  Allergen Reaction Noted   Macrobid  [nitrofurantoin  monohyd macro] Other (See Comments) 11/15/2015   Meloxicam  Other (See Comments) 10/31/2011   Digoxin  and related Other (See Comments) 06/09/2015   Diprolene  [betamethasone  dipropionate aug] Other (See Comments) 04/25/2022   Ferrous sulfate Other (See Comments) 11/22/2021   Keflex  [cephalexin ] Nausea And Vomiting 05/20/2021   Lipitor  [atorvastatin ]  12/31/2022   Mobic  [meloxicam ] Other (See Comments) 08/20/2018   Sulfamethoxazole -trimethoprim  Nausea Only 08/07/2011    Past Medical History:  Diagnosis Date   Acute blood loss anemia 12/25/2017   Allergic rhinitis    Anxiety    Arthritis    my whole spine (07/01/2017)   Arthritis    Atrial fibrillation (HCC)    Bowel obstruction (HCC)    in [place]    Bradycardia with 41-50 beats per minute 12/27/2017   Cancer (HCC)    Cellulitis of leg, right with large prepatella hematoma and open wounds 12/26/2017   CKD (chronic kidney disease)  stage 2, GFR 60-89 ml/min    Colon polyps    Coronary artery disease    10/18 PCI/DES to p/m LCx with cutting balloon to mLcx   Diverticulosis of colon    GERD (gastroesophageal reflux disease)    Hip bursitis 2010   Dr Jacklin, Post op seroma   History of colon polyps    HSV (herpes simplex virus) anogenital infection 07/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   HTN (hypertension)    IBS (irritable bowel syndrome)    constipation predominant - Dr Luis   Lichen sclerosus    Osteopenia 11/2016   T score -2.0 FRAX 15%/4.3%   PAC (premature atrial contraction)    Symptomatiic   Renal insufficiency    Right bundle branch block (RBBB) on electrocardiogram (ECG) 12/27/2017   Scoliosis    SVT (supraventricular tachycardia)    brief history   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   biopsy showing vulvar atypia, possible VIN I    Past Surgical History:  Procedure Laterality Date   ANTERIOR AND POSTERIOR VAGINAL REPAIR  01/2002   thelbert 01/23/2011   APPENDECTOMY  1948   CARDIAC CATHETERIZATION  06/26/2017   CORONARY ANGIOPLASTY WITH STENT PLACEMENT  07/01/2017   CORONARY ATHERECTOMY N/A 07/01/2017   Procedure: CORONARY ATHERECTOMY;  Surgeon: Claudene Victory ORN, MD;  Location: [facility] INVASIVE CV LAB;  Service: Cardiovascular;  Laterality: N/A;   CORONARY STENT INTERVENTION N/A 07/01/2017   Procedure: CORONARY STENT INTERVENTION;  Surgeon: Claudene Victory ORN, MD;  Location: [facility] INVASIVE CV LAB;  Service: Cardiovascular;  Laterality: N/A;   HAMMER TOE SURGERY     HEMORRHOID BANDING     HIP SURGERY Left 04/2009   hip examination under anesthesia followed by greater trochanteric bursectomy; iliotibial band tenotomy/notes 01/20/2011   I & D EXTREMITY Right 01/10/2018   Procedure: IRRIGATION AND DEBRIDEMENT RIGHT KNEE, APPLY WOUND VAC;  Surgeon: Harden Jerona GAILS, MD;  Location: [facility] OR;  Service: Orthopedics;  Laterality: Right;   KNEE BURSECTOMY Right 04/2009   thelbert 01/09/2011   LEFT ATRIAL  APPENDAGE OCCLUSION N/A 08/24/2021   Procedure: LEFT ATRIAL APPENDAGE OCCLUSION;  Surgeon: Wonda Sharper, MD;  Location[facility] INVASIVE CV LAB;  Service: Cardiovascular;  Laterality: N/A;   LEFT HEART CATH AND CORONARY ANGIOGRAPHY N/A 06/26/2017   Procedure: LEFT HEART CATH AND CORONARY ANGIOGRAPHY;  Surgeon: Claudene Victory ORN, MD;  Location: [facility] INVASIVE CV LAB;  Service: Cardiovascular;  Laterality: N/A;   PUBOVAGINAL SLING  01/2002   thelbert 01/23/2011   REDUCTION MAMMAPLASTY     TEE WITHOUT CARDIOVERSION N/A 08/24/2021   Procedure: TRANSESOPHAGEAL ECHOCARDIOGRAM (TEE);  Surgeon: Wonda Sharper, MD;  Location: [facility] INVASIVE CV LAB;  Service: Cardiovascular;  Laterality: N/A;   TEMPORARY PACEMAKER N/A 07/01/2017   Procedure: TEMPORARY PACEMAKER;  Surgeon: Claudene Victory ORN, MD;  Location: [facility] INVASIVE CV LAB;  Service: Cardiovascular;  Laterality: N/A;   VAGINAL HYSTERECTOMY  01/2002   Vaginal hysterectomy, bilateral salpingo-oophorectomy/notes 01/23/2011    Family History  Problem Relation Age of Onset   Colon cancer Mother        Dx age 82, died at age 68   Diabetes Father    Prostate cancer Father    Prostate cancer Brother    Pancreatic cancer Brother    Stomach cancer Son     Social History   Socioeconomic History   Marital status: Married    Spouse name: [name]   Number of children: 2   Years of education: Not on file   Highest education level: Bachelor's degree (e.g., BA, AB, BS)  Occupational History   Occupation: Engineer, Manufacturing Systems: RETIRED  Tobacco Use   Smoking status: Former    Types: Cigarettes   Smokeless tobacco: Never  Vaping Use   Vaping status: Never Used  Substance and Sexual Activity   Alcohol use: Yes    Comment: moderatley [place] before dinner   Drug use: Never   Sexual activity: Not Currently    Birth control/protection: Surgical    Comment: hysterectomy; less than 5, IC after 18, no STD, no abnormal paps  Other Topics Concern   Not on file  Social  History Narrative   ** Merged History Encounter ** Regular Exercise -  YES      Lives at home with husband/2025   Social Drivers of Health   Tobacco Use: Medium Risk (08/24/2024)   Patient History    Smoking Tobacco Use: Former    Smokeless Tobacco Use: Never    Passive Exposure:  Not on file  Financial Resource Strain: Low Risk (11/22/2023)   Overall Financial Resource Strain (CARDIA)    Difficulty of Paying Living Expenses: Not hard at all  Food Insecurity: No Food Insecurity (07/22/2024)   Epic    Worried About Programme Researcher, Broadcasting/film/video in the Last Year: Never true    Ran Out of Food in the Last Year: Never true  Transportation Needs: No Transportation Needs (07/22/2024)   Epic    Lack of Transportation (Medical): No    Lack of Transportation (Non-Medical): No  Physical Activity: Insufficiently Active (11/22/2023)   Exercise Vital Sign    Days of Exercise per Week: 2 days    Minutes of Exercise per Session: 60 min  Stress: No Stress Concern Present (11/22/2023)   [employer] of Occupational Health - Occupational Stress Questionnaire    Feeling of Stress : Only a little  Social Connections: Moderately Isolated (07/19/2024)   Social Connection and Isolation Panel    Frequency of Communication with Friends and Family: Three times a week    Frequency of Social Gatherings with Friends and Family: Three times a week    Attends Religious Services: Never    Active Member of Clubs or Organizations: No    Attends Banker Meetings: Never    Marital Status: Married  Catering Manager Violence: Not At Risk (07/22/2024)   Epic    Fear of Current or Ex-Partner: No    Emotionally Abused: No    Physically Abused: No    Sexually Abused: No  Depression (PHQ2-9): Low Risk (07/22/2024)   Depression (PHQ2-9)    PHQ-2 Score: 0  Alcohol Screen: Low Risk (11/22/2023)   Alcohol Screen    Last Alcohol Screening Score (AUDIT): 0  Housing: Unknown (07/22/2024)   Epic    Unable to Pay  for Housing in the Last Year: No    Number of Times Moved in the Last Year: Not on file    Homeless in the Last Year: No  Utilities: Not At Risk (07/22/2024)   Epic    Threatened with loss of utilities: No  Health Literacy: Adequate Health Literacy (11/22/2023)   B1300 Health Literacy    Frequency of need for help with medical instructions: Never      Review of systems: All other review of systems negative except as mentioned in the HPI.   Physical Exam: Vitals:   09/01/24 1346  BP: 124/68  Pulse: 81   Body mass index is 25.42 kg/m. Gen:      No acute distress HEENT:  sclera anicteric CV: s1s2 rrr, no murmur Lungs: B/l clear. Abd:      soft, non-tender; no palpable masses, no distension Ext:    No edema Neuro: alert and oriented x 3 Psych: normal mood and affect  Data Reviewed:  Reviewed labs, radiology imaging, old records and pertinent past GI work up   Assessment and Plan Assessment & Plan Recurrent Clostridioides difficile infection She has experienced three episodes in the past year, is currently asymptomatic, and meets criteria for fecal microbiota transplant to reduce recurrence risk. - Requested insurance approval for Vowst  (oral fecal microbiota transplant) to reduce recurrence risk. - Provided education regarding Vowst , including short bowel preparation with MiraLAX  and a three-days, 4 capsules daily course. - Ordered C. difficile test kit for home use in case of recurrence of watery diarrhea. - Prescribed vancomycin  to be kept on hand for immediate use if symptoms recur, with instructions not to initiate without direction.  She is currently asymptomatic and has recently completed taper dose of oral vancomycin  and has been off antibiotics for past 7 days  Constipation She reports mild constipation with harder, fragmented stools and mild abdominal discomfort, likely secondary to recent changes in bowel regimen following antibiotic therapy. - Recommended  resuming docusate at bedtime as a stool softener, with option to use every other night if bowel movements increase. - Advised to increase water intake to 6-8 cups per day and consume small, frequent meals. - Encouraged gradual reintroduction of dietary fiber as tolerated.    Return in 2 months or sooner if needed   This visit required >30 minutes of patient care (this includes precharting, chart review, review of results, face-to-face time used for counseling as well as treatment plan and follow-up. The patient was provided an opportunity to ask questions and all were answered. The patient agreed with the plan and demonstrated an understanding of the instructions.  Hailey [name] , MD    CC: Plotnikov, [name], MD    "

## 2024-09-01 NOTE — Telephone Encounter (Signed)
 Vowst  rx printed as Blondie, CMA was unable to print from her computer.

## 2024-09-01 NOTE — Telephone Encounter (Signed)
 1.What is the diagnosis? Recurrent clostridioides difficile infection (CDI)  2.Does the patient have a diagnosis of recurrent clostridioides difficile infection (CDI) as defined by BOTH of the following the presence of diarrhea ( 3 or more loose bowel  movements within 24 hours for 2 consecutive days,  and a positive test for c difficile?  Yes.  3. Is the medication prescribed by a gastroenterologist?  Yes 4. Does the patient have a hx of 2 or more recurrent episodes of c. Diff within 12 months?  Yes, Three episodes in the past year, most recent episode last month September 2025, May 26, 2024 most recent 07/19/2024. 5. Has the patient completed at least 10 consecutive days of ONE of the following antibiotic therapies for 2-4 days prior to initiating the request for Voust: 1. Oral vancomycin ; 2 Dificid  Yes- Vancomycin   Completed a tapering course of Vancomycin , but recently ran out and is currently off antibiotics for past 7 days   6. Will the patient complete a course of magnesium  citrate 296 ml bottle or polyethylene glycol electrolytes solution, for impaired kidney function,  the day before and at least 8 hours prior to initiating Vowst ?   Yes 7. Is the previous episode of CDI under control < 3 unformed/loose stools/day for 2 consecutive days? Increased stool frequency over the past week to two to three movements per day, above baseline - Urgency to defecate after breakfast - Stools have become harder over the last two days - No watery or loose stools - No current diarrhea - Incomplete emptying - Occasionally wakes up at night to defecate.

## 2024-09-01 NOTE — Patient Instructions (Addendum)
 VISIT SUMMARY:  Today we discussed your recurrent C. difficile infection and current bowel symptoms. We reviewed your recent history and made a plan to manage your symptoms and reduce the risk of recurrence.  YOUR PLAN:  RECURRENT CLOSTRIDIOIDES DIFFICILE INFECTION: You have had three episodes of C. difficile infection in the past year, but you are currently asymptomatic.  -We have requested insurance approval for Vowst , an oral fecal microbiota transplant, to reduce the risk of recurrence.  -You will need to do a short bowel preparation with MiraLAX  and take a three-day course of Vowst  capsules.  -We provided you with a C. difficile test kit for home use in case you experience watery diarrhea again.  -We prescribed vancomycin  for you to keep on hand, but do not start taking it without our direction.  -We scheduled a follow-up appointment in February to reassess your status.  CONSTIPATION: You have been experiencing mild constipation with harder stools and mild abdominal discomfort. -Resume taking docusate (Colace) at bedtime as a stool softener. You can use it every other night if your bowel movements increase. -Increase your water intake to 6-8 cups per day. -Consume small, frequent meals. -Gradually reintroduce dietary fiber as tolerated.  Your provider has requested that you go to the basement level for lab work before leaving today. Press B on the elevator. The lab is located at the first door on the left as you exit the elevator.  Thank you for choosing me and [facility].  Dr. Nandigam

## 2024-09-02 NOTE — Telephone Encounter (Signed)
 Patient assistance form, clinical notes to include labs and rx have been faxed to Vowst  [employer].

## 2024-09-08 ENCOUNTER — Telehealth: Payer: Self-pay

## 2024-09-08 ENCOUNTER — Other Ambulatory Visit (HOSPITAL_COMMUNITY): Payer: Self-pay

## 2024-09-08 NOTE — Telephone Encounter (Signed)
 PA request has been Submitted. New Encounter has been or will be created for follow up. For additional info see Pharmacy Prior Auth telephone encounter from 09-08-2024.

## 2024-09-08 NOTE — Telephone Encounter (Signed)
 Pharmacy Patient Advocate Encounter  Received notification from [facility] Medicare that Prior Authorization for Vowst  capsules has been APPROVED from 09-08-2024 to 09-22-2024   PA #/Case ID/Reference #: [id number]

## 2024-09-08 NOTE — Telephone Encounter (Signed)
 Pharmacy Patient Advocate Encounter   Received notification from Pt Calls Messages that prior authorization for Vowst  capsules is required/requested.   Insurance verification completed.   The patient is insured through [facility].   Per test claim: PA required; PA submitted to above mentioned insurance via Latent Key/confirmation #/EOC [id number] Status is pending

## 2024-09-08 NOTE — Telephone Encounter (Signed)
 FYI- Received fax form Vowst  [facility] Confirmation:   Patient ID number [id number] ( case number) for Vowst . Specialty Pharmacy: [facility].   Fax has  been forwarded to [place] team.

## 2024-09-11 NOTE — Telephone Encounter (Signed)
 Call from [employer] on behalf of pt. Pt is taking Vowst  and is not taking an antibiotic. Pharmacy stated antibiotic should be continued or switch to off label medication. Call Back number [phone] Randall Please advise thank you.

## 2024-09-11 NOTE — Telephone Encounter (Addendum)
 Spoke with the pharmacy. Vowst  is ready to ship. The pharmacy tech contacted the patient about shipment. Patient told them she has been off the antibiotics for quite a while. Called the patient to get clarification on stopping the antibiotics and an update on her symptoms. Voicemail is full and cannot accept messages. I will send a message through My Chart.

## 2024-09-14 ENCOUNTER — Encounter: Payer: Self-pay | Admitting: Gastroenterology

## 2024-09-15 ENCOUNTER — Telehealth: Payer: Self-pay

## 2024-09-15 NOTE — Telephone Encounter (Signed)
 Patient contacted due to a patient advise request asking for telephone call from the nurse. Patient tells me she had to take oxycodone  x 4 days at bedtime due to sciatic pain that was interfering with her ability to sleep. Patient then became constipated and reports no bowel movement for 3 or 4 days. She began taking Miralax  every night after day 2 or 3. Today she has had 2 bowel movements. She will continue the Miralax  until she feels she has emptied. She continues to have a low abdominal cramp  like menstrual cramps and a history of diverticulitis.  Also, patient has been approved for Vowst  and it is ready to ship. However, she has been off of antibiotics for a long time.

## 2024-09-16 ENCOUNTER — Telehealth (HOSPITAL_BASED_OUTPATIENT_CLINIC_OR_DEPARTMENT_OTHER): Payer: Self-pay | Admitting: *Deleted

## 2024-09-16 NOTE — Telephone Encounter (Signed)
"  ° °  Pre-operative Risk Assessment    Patient Name: Hailey [name]  DOB: Nov 16, 1934 MRN: [id number]   Date of last office visit: 05/19/24 DR. THUKKANI Date of next office visit: NONE   Request for Surgical Clearance    Procedure:  LUMBAR EPIDURAL   Date of Surgery:  Clearance TBD                                Surgeon:  DR. DAVE [facility] Surgeon's Group or Practice Name:  [facility] Phone number:  [phone] Fax number:  [phone]   Type of Clearance Requested:   - Medical  - Pharmacy:  Hold Apixaban  (Eliquis ) x 3 DAYS PRIOR    Type of Anesthesia:  Not Indicated   Additional requests/questions:    [name]   09/16/2024, 2:19 PM   "

## 2024-09-16 NOTE — Telephone Encounter (Signed)
 Spoke with the patient. Symptoms of low abdominal discomfort have persisted. Appointment 09/17/24 for evaluation.

## 2024-09-17 ENCOUNTER — Encounter: Payer: Self-pay | Admitting: Gastroenterology

## 2024-09-17 ENCOUNTER — Ambulatory Visit: Admitting: Gastroenterology

## 2024-09-17 ENCOUNTER — Other Ambulatory Visit: Payer: Self-pay | Admitting: Internal Medicine

## 2024-09-17 VITALS — BP 104/72 | HR 70 | Ht 63.0 in | Wt 143.4 lb

## 2024-09-17 DIAGNOSIS — A0472 Enterocolitis due to Clostridium difficile, not specified as recurrent: Secondary | ICD-10-CM | POA: Diagnosis not present

## 2024-09-17 DIAGNOSIS — R1030 Lower abdominal pain, unspecified: Secondary | ICD-10-CM

## 2024-09-17 DIAGNOSIS — G8929 Other chronic pain: Secondary | ICD-10-CM

## 2024-09-17 DIAGNOSIS — R195 Other fecal abnormalities: Secondary | ICD-10-CM | POA: Diagnosis not present

## 2024-09-17 DIAGNOSIS — K589 Irritable bowel syndrome without diarrhea: Secondary | ICD-10-CM

## 2024-09-17 NOTE — Patient Instructions (Signed)
 Start Dicyclomine  20 mg twice daily regularly.   Call or send Mychart in 7 days with an update.   _______________________________________________________  If your blood pressure at your visit was 140/90 or greater, please contact your primary care physician to follow up on this.  _______________________________________________________  If you are age 88 or older, your body mass index should be between 23-30. Your Body mass index is 25.4 kg/m. If this is out of the aforementioned range listed, please consider follow up with your Primary Care Provider.  If you are age 52 or younger, your body mass index should be between 19-25. Your Body mass index is 25.4 kg/m. If this is out of the aformentioned range listed, please consider follow up with your Primary Care Provider.   ________________________________________________________  The [facility] providers would like to encourage you to use MYCHART to communicate with providers for non-urgent requests or questions.  Due to long hold times on the telephone, sending your provider a message by [facility] may be a faster and more efficient way to get a response.  Please allow 48 business hours for a response.  Please remember that this is for non-urgent requests.  _______________________________________________________  [facility] is using a team-based approach to care.  Your team is made up of your doctor and two to three APPS. Our APPS (Nurse Practitioners and Physician Assistants) work with your physician to ensure care continuity for you. They are fully qualified to address your health concerns and develop a treatment plan. They communicate directly with your gastroenterologist to care for you. Seeing the Advanced Practice Practitioners on your physician's team can help you by facilitating care more promptly, often allowing for earlier appointments, access to diagnostic testing, procedures, and other specialty referrals.

## 2024-09-17 NOTE — Progress Notes (Signed)
 "    09/17/2024 Hailey [name] [id number] 10-Nov-1934   Discussed the use of AI scribe software for clinical note transcription with the patient, who gave verbal consent to proceed.  History of Present Illness Hailey [name] is an 89 year old female with recurrent Clostridioides difficile infection who presents for ongoing management of lower abdominal cramping.  She is a patient of Dr. Trenna.  Complex history of recurrent Clostridioides difficile infection, previously managed with vancomycin . She has been off antibiotics for several weeks and is awaiting shipment of a medication, VOWST  that was ordered by Dr. Shila.  Persistent, constant cramping localized to the lower abdomen, described as menstrual cramps and associated with tenderness, especially on the right side. Cramping is the most prominent symptom. Previous abdominal distension and bloating have resolved. Pain does not interfere with sleep, though she has difficulty falling asleep due to an active mind.  Frequent bowel movements, up to five formed stools daily. Denies diarrhea. Recent constipation occurred after several nights of oxycodone  use for back and sciatic pain, resolved with three nights of Miralax , followed by increased stool frequency. Urge to defecate occurs with eating, even small amounts. No weight loss reported.  Hyoscyamine  used as needed for cramping with relief. Dicyclomine  available but not used in combination or on a regular schedule.    Past Medical History:  Diagnosis Date   Acute blood loss anemia 12/25/2017   Allergic rhinitis    Anxiety    Arthritis    my whole spine (07/01/2017)   Arthritis    Atrial fibrillation (HCC)    Bowel obstruction (HCC)    in [place]    Bradycardia with 41-50 beats per minute 12/27/2017   Cancer (HCC)    Cellulitis of leg, right with large prepatella hematoma and open wounds 12/26/2017   CKD (chronic kidney disease) stage 2, GFR 60-89 ml/min    Colon polyps     Coronary artery disease    10/18 PCI/DES to p/m LCx with cutting balloon to mLcx   Diverticulosis of colon    GERD (gastroesophageal reflux disease)    Hip bursitis 2010   Dr Jacklin, Post op seroma   History of colon polyps    HSV (herpes simplex virus) anogenital infection 07/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   HTN (hypertension)    IBS (irritable bowel syndrome)    constipation predominant - Dr Luis   Lichen sclerosus    Osteopenia 11/2016   T score -2.0 FRAX 15%/4.3%   PAC (premature atrial contraction)    Symptomatiic   Renal insufficiency    Right bundle branch block (RBBB) on electrocardiogram (ECG) 12/27/2017   Scoliosis    SVT (supraventricular tachycardia)    brief history   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   biopsy showing vulvar atypia, possible VIN I   Past Surgical History:  Procedure Laterality Date   ANTERIOR AND POSTERIOR VAGINAL REPAIR  01/2002   thelbert 01/23/2011   APPENDECTOMY  1948   CARDIAC CATHETERIZATION  06/26/2017   CORONARY ANGIOPLASTY WITH STENT PLACEMENT  07/01/2017   CORONARY ATHERECTOMY N/A 07/01/2017   Procedure: CORONARY ATHERECTOMY;  Surgeon: Claudene Victory ORN, MD;  Location: [facility];  Service: Cardiovascular;  Laterality: N/A;   CORONARY STENT INTERVENTION N/A 07/01/2017   Procedure: CORONARY STENT INTERVENTION;  Surgeon: Claudene Victory ORN, MD;  Location: [facility];  Service: Cardiovascular;  Laterality: N/A;   HAMMER TOE SURGERY     HEMORRHOID BANDING  HIP SURGERY Left 04/2009   hip examination under anesthesia followed by greater trochanteric bursectomy; iliotibial band tenotomy/notes 01/20/2011   I & D EXTREMITY Right 01/10/2018   Procedure: IRRIGATION AND DEBRIDEMENT RIGHT KNEE, APPLY WOUND VAC;  Surgeon: Harden Jerona GAILS, MD;  Location: [facility] OR;  Service: Orthopedics;  Laterality: Right;   KNEE BURSECTOMY Right 04/2009   thelbert 01/09/2011   LEFT ATRIAL APPENDAGE OCCLUSION N/A 08/24/2021   Procedure:  LEFT ATRIAL APPENDAGE OCCLUSION;  Surgeon: Wonda Sharper, MD;  Location: [facility] INVASIVE CV LAB;  Service: Cardiovascular;  Laterality: N/A;   LEFT HEART CATH AND CORONARY ANGIOGRAPHY N/A 06/26/2017   Procedure: LEFT HEART CATH AND CORONARY ANGIOGRAPHY;  Surgeon: Claudene Victory ORN, MD;  Location: [facility];  Service: Cardiovascular;  Laterality: N/A;   PUBOVAGINAL SLING  01/2002   thelbert 01/23/2011   REDUCTION MAMMAPLASTY     TEE WITHOUT CARDIOVERSION N/A 08/24/2021   Procedure: TRANSESOPHAGEAL ECHOCARDIOGRAM (TEE);  Surgeon: Wonda Sharper, MD;  Location: [facility] INVASIVE CV LAB;  Service: Cardiovascular;  Laterality: N/A;   TEMPORARY PACEMAKER N/A 07/01/2017   Procedure: TEMPORARY PACEMAKER;  Surgeon: Claudene Victory ORN, MD;  Location: [facility] INVASIVE CV LAB;  Service: Cardiovascular;  Laterality: N/A;   VAGINAL HYSTERECTOMY  01/2002   Vaginal hysterectomy, bilateral salpingo-oophorectomy/notes 01/23/2011    reports that she has quit smoking. Her smoking use included cigarettes. She has never used smokeless tobacco. She reports current alcohol use. She reports that she does not use drugs. family history includes Colon cancer in her mother; Diabetes in her father; Pancreatic cancer in her brother; Prostate cancer in her brother and father; Stomach cancer in her son. Allergies[1]    Outpatient Encounter Medications as of 09/17/2024  Medication Sig   acetaminophen  (TYLENOL ) 500 MG tablet Take 1,000 mg by mouth every 6 (six) hours as needed (body aches / sore hip). Maximum of 6 tablets daily (3000mg )   apixaban  (ELIQUIS ) 2.5 MG TABS tablet TAKE 1 TABLET BY MOUTH TWICE  DAILY   [employer] Sunscreen SPF 30 LOTN apply topically to sun exposed areas Daily; Duration: 30   diclofenac  Sodium (VOLTAREN ) 1 % GEL Apply 2 g topically 4 (four) times daily as needed (back pain.).   dicyclomine  (BENTYL ) 20 MG tablet Take 1 by mouth every 4-6 hours as needed for cramping   escitalopram  (LEXAPRO ) 10 MG tablet Take 1 tablet (10  mg total) by mouth daily.   famotidine  (PEPCID ) 20 MG tablet Take 1 tablet (20 mg total) by mouth 2 (two) times daily.   Homeopathic Products (ARNICARE) GEL Apply 1 application  topically daily as needed (skin bruising).   hyoscyamine  (LEVSIN ) 0.125 MG tablet TAKE 1-2 TABLETS EVERY 4 HOURS AS NEEDED FOR UP TO 10 DAYS FOR CRAMPING.   ipratropium (ATROVENT ) 0.06 % nasal spray Place 2 sprays into both nostrils 4 (four) times daily.   ketotifen  (ZADITOR ) 0.035 % ophthalmic solution    LORazepam  (ATIVAN ) 1 MG tablet TAKE ONE TABLET BY MOUTH TWICE DAILY AS NEEDED FOR ANXIETY / SLEEP.   losartan  (COZAAR ) 25 MG tablet Take 1 tablet (25 mg total) by mouth daily.   nitroGLYCERIN  (NITROSTAT ) 0.4 MG SL tablet Place 1 tablet (0.4 mg total) under the tongue every 5 (five) minutes as needed for chest pain (Call 911 at 3rd dose within 15 minutes.).   NON FORMULARY Vitamin c, vitamin d    ondansetron  (ZOFRAN ) 4 MG tablet Take 1 tablet (4 mg total) by mouth every 8 (eight) hours as needed for nausea or vomiting.  ondansetron  (ZOFRAN -ODT) 8 MG disintegrating tablet dissolve 1 tablet (8 mg total) in mouth every 8 (eight) hours as needed for nausea or vomiting.   Polyethyl Glycol-Propyl Glycol (LUBRICANT EYE DROPS) 0.4-0.3 % SOLN Place 1-2 drops into both eyes at bedtime.   polyethylene glycol (MIRALAX  / GLYCOLAX ) 17 g packet Take 17 g by mouth as needed for mild constipation or moderate constipation.   saccharomyces boulardii (FLORASTOR) 250 MG capsule Take 1 capsule (250 mg total) by mouth 2 (two) times daily.   sodium chloride  (OCEAN) 0.65 % SOLN nasal spray Place 1 spray into both nostrils at bedtime as needed for congestion.   vancomycin  (VANCOCIN ) 125 MG capsule Take 1 capsule by mouth 4 times daily for 12 days, THEN 1 capsule twice daily for 7 days, THEN 1 capsule daily for 7 days, THEN 1 capsule every other day for 28 days, THEN 1 capsule  every 3 days for 28 days. (Patient not taking: No sig reported)   No  facility-administered encounter medications on file as of 09/17/2024.    REVIEW OF SYSTEMS  : All other systems reviewed and negative except where noted in the History of Present Illness.   PHYSICAL EXAM: BP 104/72   Pulse 70   Ht 5' 3 (1.6 m)   Wt 143 lb 6 oz (65 kg)   SpO2 96%   BMI 25.40 kg/m  General: Well developed white female in no acute distress Head: Normocephalic and atraumatic Eyes:  Sclerae anicteric, conjunctiva pink. Ears: Normal auditory acuity Lungs: Clear throughout to auscultation; no W/R/R. Heart: Regular rate and rhythm; no M/R/G. Abdomen: Soft, non-distended.  BS present.  Mild lower abdominal TTP. Musculoskeletal: Symmetrical with no gross deformities  Skin: No lesions on visible extremities Neurological: Alert oriented x 4, grossly non-focal Psychological:  Alert and cooperative. Normal mood and affect  Assessment & Plan Recurrent Clostridioides difficile infection Recurrent C. difficile infection. Initiation of fecal microbiota product (VOWST ) is delayed due to shipment and need for protocol clarification regarding vancomycin  pre-treatment. - Contacted nursing staff to clarify VOWST  shipment status and protocol requirements for vancomycin  pre-treatment. - Will determine necessity of repeat vancomycin  course prior to VOWST  administration based on protocol and timing. - Instructed her to await further instructions regarding initiation of vancomycin  and VOWST .  Chronic abdominal pain and altered bowel habits Chronic lower abdominal cramping and increased stool frequency, likely multifactorial, including recent opioid-induced constipation and subsequent laxative use. Cramping remains the predominant symptom. No nocturnal symptoms or diarrhea. - Initiated dicyclomine  20 mg orally twice daily for ongoing cramping. - Advised use of hyoscyamine  as needed for breakthrough cramping, with instructions not to take both antispasmodics simultaneously. - Provided  anticipatory guidance that dicyclomine  may slow bowel motility and reduce stool frequency. - Recommended diet as tolerated, with no specific restrictions given absence of diarrhea. - Instructed her to contact the office via MyChart or phone early next week to report symptom response to the new regimen.   CC:  Plotnikov, [name], MD        [1]  Allergies Allergen Reactions   Macrobid  [Nitrofurantoin  Monohyd Macro] Other (See Comments)    Nausea, stomach cramps, fatigue , headache.   Meloxicam  Other (See Comments)    Jittery and headache   Digoxin  And Related Other (See Comments)    headaches   Diprolene  [Betamethasone  Dipropionate Aug] Other (See Comments)    Patient states that the bruising was made worse when using this   Ferrous Sulfate Other (See Comments)  Bad constipation    Gabapentin     Keflex  [Cephalexin ] Nausea And Vomiting    Nausea, fatigue, and headache.   Lipitor  [Atorvastatin ]     Abd pain   Mobic  [Meloxicam ] Other (See Comments)    Unsure of reaction type   Sulfamethoxazole -Trimethoprim  Nausea Only   "

## 2024-09-18 ENCOUNTER — Encounter: Payer: Self-pay | Admitting: Gastroenterology

## 2024-09-18 ENCOUNTER — Encounter: Payer: Self-pay | Admitting: Obstetrics and Gynecology

## 2024-09-18 NOTE — Telephone Encounter (Signed)
 Patient with diagnosis of Afib on Eliquis  for anticoagulation.    Procedure: LUMBAR EPIDURAL  Date of procedure: TBD   CHA2DS2-VASc Score = 6   This indicates a 9.7% annual risk of stroke. The patient's score is based upon: CHF History: 1 HTN History: 1 Diabetes History: 0 Stroke History: 0 Vascular Disease History: 1 Age Score: 2 Gender Score: 1     CrCl 39 mL/min Platelet count 143 K   Patient has not had an Afib/aflutter ablation in the last 3 months, DCCV within the last 4 weeks or a watchman implanted in the last 45 days     Per office protocol, patient can hold Eliquis  for 3 days prior to procedure.   Patient will not need bridging with Lovenox (enoxaparin) around procedure.  **This guidance is not considered finalized until pre-operative APP has relayed final recommendations.**

## 2024-09-18 NOTE — Telephone Encounter (Signed)
 Call placed to patient to advise her that she would need the provider that advised she start this medication to send it to the pharmacy. Patient states that she had made a mistake by sending the mychart message to Dr. Nikki.

## 2024-09-18 NOTE — Telephone Encounter (Signed)
"  ° °  Patient Name: Hailey [name]  DOB: 06/02/1935 MRN: [id number]  Primary Cardiologist: Arun K Thukkani, MD  Chart reviewed as part of pre-operative protocol coverage. Given past medical history and time since last visit, based on ACC/AHA guidelines, Cletis Muma Prosser is at acceptable risk for the planned procedure without further cardiovascular testing.   Patient was last seen by Dr. Wendel in September 2025, since then, she had some abdominal issue but no cardiac concerns.  No chest pain or worsening dyspnea.  She has been instructed to hold Eliquis  for 3 days prior to the procedure and to resume as soon as possible afterward at the surgeon's discretion.  The patient was advised that if she develops new symptoms prior to surgery to contact our office to arrange for a follow-up visit, and she verbalized understanding.  I will route this recommendation to the requesting party via Epic fax function and remove from pre-op pool.  Please call with questions.  Nhi Butrum, [place] 09/18/2024, 2:34 PM  "

## 2024-09-22 ENCOUNTER — Telehealth: Payer: Self-pay

## 2024-09-22 NOTE — Telephone Encounter (Signed)
-----   Message from Harlene Mail, [place] sent at 09/22/2024 10:43 AM EST ----- Regarding: RE: I guess then we can go ahead and see what needs to be done for it to be shipped to her then.  Thank you,  Jess ----- Message ----- From: Shila Gustav GAILS, MD Sent: 09/21/2024   2:42 PM EST To: Harlene JONETTA Mail, PA-C; Almarie DELENA Goo, LPN Subject: RE:                                            If she is asymptomatic she doesn't need treatment with Vancomycin  again, just does Vowst  dosing ----- Message ----- From: Mail Harlene JONETTA DEVONNA Sent: 09/20/2024   3:38 PM EST To: Almarie DELENA Goo, LPN; Gustav Shila GAILS, # Subject: RE:                                            Gentry not been off since spring. Shes only been off since about November or December. I will have to look at the protocol in the email, but Dr. Shila ordered it/wanted it. I will add her to this bc I am assuming we will have to put her back on vancomycin  as the protocol, I believe, requires her to be on the vancomycin  leading up to this treatment. ----- Message ----- From: Goo Almarie DELENA, LPN Sent: 8/0/7973   5:16 PM EST To: Harlene JONETTA Zehr, PA-C  That was the question. What did you want to do since she had been off the antibiotics since the spring? ----- Message ----- From: Zehr, Jessica D, PA-C Sent: 09/18/2024   4:54 PM EST To: Almarie DELENA Goo, LPN  Walterine Hun!  Where are things that with her Vowst ?  I know that [facility] had ordered it.  It said it was ready to ship, but she has been off of antibiotics so we may need to resume those?  I saw her in the office yesterday for her lower abdominal pain and asked her to use her dicyclomine  regularly, but I told her I would check on the Vowst .  Thank you!  Jess

## 2024-09-22 NOTE — Telephone Encounter (Signed)
 Pharmacy contacted. Aware to ship medication to the patient. Contacted the patient. She will contact the pharmacy to discuss co-pay. Presently not on antibiotics.

## 2024-09-25 ENCOUNTER — Other Ambulatory Visit: Payer: Self-pay | Admitting: Physician Assistant

## 2024-09-28 NOTE — Telephone Encounter (Signed)
 Inbound call from patient trying to follow up on this medication. Patient has requested for the nurse to please return her call. Please advise.

## 2024-09-28 NOTE — Telephone Encounter (Signed)
 Patient wanting to confirm her instructions for Vowst . She has received the shipment.  Take 4 capsules of VOWST  orally once a day for 3 days  Do not eat or drink for at least 8 hours before your first dose of VOWST  For example, you can take the laxative in the evening at least 8 hours prior to the first dose of VOWST  and fast overnight Take VOWST  before your first meal, on an empty stomach Swallow each capsule whole. DO NOT crush, chew, or break the capsules Do not take VOWST  at the same time as antibiotics or the laxative

## 2024-09-29 ENCOUNTER — Encounter: Payer: Self-pay | Admitting: Gastroenterology

## 2024-09-30 ENCOUNTER — Other Ambulatory Visit

## 2024-09-30 DIAGNOSIS — A0471 Enterocolitis due to Clostridium difficile, recurrent: Secondary | ICD-10-CM

## 2024-09-30 DIAGNOSIS — K5904 Chronic idiopathic constipation: Secondary | ICD-10-CM

## 2024-10-01 LAB — CLOSTRIDIUM DIFFICILE TOXIN B, QUALITATIVE, REAL-TIME PCR: Toxigenic C. Difficile by PCR: DETECTED — AB

## 2024-10-01 NOTE — Telephone Encounter (Signed)
 Noted.

## 2024-10-02 ENCOUNTER — Other Ambulatory Visit: Payer: Self-pay | Admitting: *Deleted

## 2024-10-02 ENCOUNTER — Ambulatory Visit: Payer: Self-pay | Admitting: Gastroenterology

## 2024-10-02 ENCOUNTER — Other Ambulatory Visit (HOSPITAL_COMMUNITY): Payer: Self-pay

## 2024-10-02 ENCOUNTER — Encounter: Payer: Self-pay | Admitting: Hematology

## 2024-10-02 DIAGNOSIS — A0472 Enterocolitis due to Clostridium difficile, not specified as recurrent: Secondary | ICD-10-CM

## 2024-10-02 MED ORDER — FIDAXOMICIN 200 MG PO TABS
200.0000 mg | ORAL_TABLET | Freq: Two times a day (BID) | ORAL | 0 refills | Status: AC
  Filled 2024-10-02: qty 20, 10d supply, fill #0

## 2024-10-06 ENCOUNTER — Telehealth: Payer: Self-pay

## 2024-10-06 ENCOUNTER — Other Ambulatory Visit: Payer: Self-pay

## 2024-10-06 ENCOUNTER — Other Ambulatory Visit: Payer: Self-pay | Admitting: Internal Medicine

## 2024-10-06 MED ORDER — HYOSCYAMINE SULFATE 0.125 MG PO TABS: 0.1250 mg | ORAL_TABLET | ORAL | 3 refills | Status: AC | PRN

## 2024-10-06 NOTE — Telephone Encounter (Signed)
 Copied from CRM #[id number]. Topic: Clinical - Medication Refill >> Oct 06, 2024 11:37 AM Robinson H wrote: Medication: hyoscyamine  (LEVSIN ) 0.125 MG tablet   Has the patient contacted their pharmacy? Yes, no more refills hadn't refilled in a long time uses as needed (Agent: If no, request that the patient contact the pharmacy for the refill. If patient does not wish to contact the pharmacy document the reason why and proceed with request.) (Agent: If yes, when and what did the pharmacy advise?)  This is the patient's preferred pharmacy:  [facility] [place], [place] - [address] [place] [address] Phone: [phone] Fax: [phone]   Is this the correct pharmacy for this prescription? Yes If no, delete pharmacy and type the correct one.   Has the prescription been filled recently? No  Is the patient out of the medication? Yes, medication is expired  Has the patient been seen for an appointment in the last year OR does the patient have an upcoming appointment? Yes  Can we respond through MyChart? Yes  Agent: Please be advised that Rx refills may take up to 3 business days. We ask that you follow-up with your pharmacy.

## 2024-10-06 NOTE — Telephone Encounter (Signed)
 Copied from CRM #[id number]. Topic: Clinical - Prescription Issue >> Oct 06, 2024 11:42 AM Robinson H wrote: Reason for CRM: Patient asking about a refill for the escitalopram  (LEXAPRO ) 10 MG tablet, system shows medication was sent to pharmacy and discontinued on 1/27.  Hailey [name] [phone]

## 2024-10-06 NOTE — Telephone Encounter (Signed)
Medication sent to pts pharmacy 

## 2024-10-07 ENCOUNTER — Other Ambulatory Visit: Payer: Self-pay | Admitting: Internal Medicine

## 2024-10-07 MED ORDER — ESCITALOPRAM OXALATE 10 MG PO TABS: 10.0000 mg | ORAL_TABLET | Freq: Every day | ORAL | 6 refills | Status: AC

## 2024-10-07 NOTE — Progress Notes (Signed)
 Ok thx.

## 2024-10-07 NOTE — Telephone Encounter (Signed)
 Done thx

## 2024-10-14 ENCOUNTER — Telehealth: Payer: Self-pay | Admitting: Gastroenterology

## 2024-10-14 NOTE — Telephone Encounter (Signed)
 Inbound call from patient requesting to speak to Dr. Shila and her Nurse in regards to a couple of question. Patient is wanting to know when does she need to start her stool sample. Patient is also wanting to know when she needs to start taking her pills since is done taking her antibiotics. Patient is requesting a call back. Please advise.

## 2024-10-14 NOTE — Telephone Encounter (Signed)
 No answer. Voicemail full.

## 2024-10-16 NOTE — Telephone Encounter (Signed)
 Ok, thanks.

## 2024-10-16 NOTE — Telephone Encounter (Signed)
 Pt returned missed call from nurse. Please advise. Thank you

## 2024-10-16 NOTE — Telephone Encounter (Signed)
 Confirmed with the patient, she completed Dificid  200 mg BID course. Last day was Monday 10/12/24. She will collect and submit a stool sample on 2/16 or 2/17. Patient will not take Vowst .

## 2024-10-26 ENCOUNTER — Ambulatory Visit: Admitting: Internal Medicine

## 2024-10-28 ENCOUNTER — Ambulatory Visit: Admitting: Gastroenterology

## 2024-11-23 ENCOUNTER — Ambulatory Visit

## 2024-12-02 ENCOUNTER — Ambulatory Visit: Admitting: Internal Medicine
